# Patient Record
Sex: Male | Born: 1940 | Race: White | Hispanic: No | Marital: Single | Smoking: Former smoker
Health system: Southern US, Community
[De-identification: ages and names within clinical notes are randomized; demographics above are authoritative.]

## PROBLEM LIST (undated history)

## (undated) DIAGNOSIS — M1990 Unspecified osteoarthritis, unspecified site: Secondary | ICD-10-CM

## (undated) DIAGNOSIS — I2119 ST elevation (STEMI) myocardial infarction involving other coronary artery of inferior wall: Secondary | ICD-10-CM

## (undated) DIAGNOSIS — I2510 Atherosclerotic heart disease of native coronary artery without angina pectoris: Secondary | ICD-10-CM

## (undated) DIAGNOSIS — T8149XA Infection following a procedure, other surgical site, initial encounter: Secondary | ICD-10-CM

## (undated) DIAGNOSIS — H269 Unspecified cataract: Secondary | ICD-10-CM

## (undated) DIAGNOSIS — C801 Malignant (primary) neoplasm, unspecified: Secondary | ICD-10-CM

## (undated) DIAGNOSIS — N529 Male erectile dysfunction, unspecified: Secondary | ICD-10-CM

## (undated) DIAGNOSIS — K2100 Gastro-esophageal reflux disease with esophagitis, without bleeding: Secondary | ICD-10-CM

## (undated) DIAGNOSIS — I10 Essential (primary) hypertension: Secondary | ICD-10-CM

## (undated) DIAGNOSIS — I779 Disorder of arteries and arterioles, unspecified: Secondary | ICD-10-CM

## (undated) DIAGNOSIS — T7840XA Allergy, unspecified, initial encounter: Secondary | ICD-10-CM

## (undated) DIAGNOSIS — E785 Hyperlipidemia, unspecified: Secondary | ICD-10-CM

## (undated) DIAGNOSIS — I739 Peripheral vascular disease, unspecified: Secondary | ICD-10-CM

## (undated) DIAGNOSIS — Z9889 Other specified postprocedural states: Secondary | ICD-10-CM

## (undated) DIAGNOSIS — G4730 Sleep apnea, unspecified: Secondary | ICD-10-CM

## (undated) DIAGNOSIS — Z8719 Personal history of other diseases of the digestive system: Secondary | ICD-10-CM

## (undated) DIAGNOSIS — C3490 Malignant neoplasm of unspecified part of unspecified bronchus or lung: Secondary | ICD-10-CM

## (undated) DIAGNOSIS — I5189 Other ill-defined heart diseases: Secondary | ICD-10-CM

## (undated) DIAGNOSIS — K219 Gastro-esophageal reflux disease without esophagitis: Secondary | ICD-10-CM

## (undated) HISTORY — PX: EYE SURGERY: SHX253

## (undated) HISTORY — DX: Sleep apnea, unspecified: G47.30

## (undated) HISTORY — DX: ST elevation (STEMI) myocardial infarction involving other coronary artery of inferior wall: I21.19

## (undated) HISTORY — DX: Unspecified cataract: H26.9

## (undated) HISTORY — DX: Hyperlipidemia, unspecified: E78.5

## (undated) HISTORY — DX: Allergy, unspecified, initial encounter: T78.40XA

## (undated) HISTORY — DX: Disorder of arteries and arterioles, unspecified: I77.9

## (undated) HISTORY — DX: Atherosclerotic heart disease of native coronary artery without angina pectoris: I25.10

## (undated) HISTORY — DX: Peripheral vascular disease, unspecified: I73.9

## (undated) HISTORY — PX: HAMMER TOE SURGERY: SHX385

## (undated) HISTORY — DX: Essential (primary) hypertension: I10

## (undated) HISTORY — DX: Malignant neoplasm of unspecified part of unspecified bronchus or lung: C34.90

## (undated) HISTORY — DX: Other ill-defined heart diseases: I51.89

## (undated) HISTORY — DX: Gastro-esophageal reflux disease with esophagitis: K21.0

## (undated) HISTORY — DX: Gastro-esophageal reflux disease with esophagitis, without bleeding: K21.00

## (undated) HISTORY — DX: Malignant (primary) neoplasm, unspecified: C80.1

## (undated) HISTORY — PX: UPPER GI ENDOSCOPY: SHX6162

## (undated) HISTORY — DX: Infection following a procedure, other surgical site, initial encounter: T81.49XA

---

## 1992-06-09 HISTORY — PX: TOE SURGERY: SHX1073

## 1999-04-23 ENCOUNTER — Ambulatory Visit (HOSPITAL_COMMUNITY): Admission: RE | Admit: 1999-04-23 | Discharge: 1999-04-23 | Payer: Self-pay | Admitting: Neurological Surgery

## 1999-04-23 ENCOUNTER — Encounter: Payer: Self-pay | Admitting: Neurological Surgery

## 2000-03-09 DIAGNOSIS — I2119 ST elevation (STEMI) myocardial infarction involving other coronary artery of inferior wall: Secondary | ICD-10-CM

## 2000-03-09 HISTORY — DX: ST elevation (STEMI) myocardial infarction involving other coronary artery of inferior wall: I21.19

## 2004-04-25 ENCOUNTER — Ambulatory Visit (HOSPITAL_BASED_OUTPATIENT_CLINIC_OR_DEPARTMENT_OTHER): Admission: RE | Admit: 2004-04-25 | Discharge: 2004-04-25 | Payer: Self-pay | Admitting: Orthopedic Surgery

## 2004-04-25 ENCOUNTER — Ambulatory Visit (HOSPITAL_COMMUNITY): Admission: RE | Admit: 2004-04-25 | Discharge: 2004-04-25 | Payer: Self-pay | Admitting: Orthopedic Surgery

## 2005-10-07 ENCOUNTER — Other Ambulatory Visit: Payer: Self-pay

## 2005-10-08 ENCOUNTER — Other Ambulatory Visit: Payer: Self-pay

## 2005-10-08 ENCOUNTER — Observation Stay: Payer: Self-pay | Admitting: Internal Medicine

## 2005-10-16 ENCOUNTER — Ambulatory Visit: Payer: Self-pay | Admitting: Cardiology

## 2005-10-20 ENCOUNTER — Ambulatory Visit: Payer: Self-pay | Admitting: Cardiology

## 2005-10-20 ENCOUNTER — Ambulatory Visit: Payer: Self-pay

## 2006-06-09 HISTORY — PX: NASAL SINUS SURGERY: SHX719

## 2006-10-21 ENCOUNTER — Ambulatory Visit: Payer: Self-pay | Admitting: Cardiology

## 2007-05-18 ENCOUNTER — Ambulatory Visit: Payer: Self-pay | Admitting: Unknown Physician Specialty

## 2007-12-30 ENCOUNTER — Ambulatory Visit: Payer: Self-pay | Admitting: Cardiology

## 2008-01-18 ENCOUNTER — Ambulatory Visit: Payer: Self-pay

## 2008-01-18 ENCOUNTER — Ambulatory Visit: Payer: Self-pay | Admitting: Cardiology

## 2008-04-12 ENCOUNTER — Emergency Department: Payer: Self-pay | Admitting: Emergency Medicine

## 2008-05-03 ENCOUNTER — Ambulatory Visit: Payer: Self-pay | Admitting: Unknown Physician Specialty

## 2008-06-09 HISTORY — PX: OTHER SURGICAL HISTORY: SHX169

## 2008-06-09 HISTORY — PX: COLONOSCOPY: SHX174

## 2008-12-23 LAB — HM COLONOSCOPY

## 2009-03-08 ENCOUNTER — Ambulatory Visit: Payer: Self-pay | Admitting: Cardiology

## 2009-03-08 DIAGNOSIS — I2510 Atherosclerotic heart disease of native coronary artery without angina pectoris: Secondary | ICD-10-CM | POA: Insufficient documentation

## 2009-04-10 ENCOUNTER — Ambulatory Visit: Payer: Self-pay | Admitting: Gastroenterology

## 2009-04-10 LAB — HM COLONOSCOPY

## 2009-06-09 HISTORY — PX: WRIST SURGERY: SHX841

## 2010-03-04 ENCOUNTER — Telehealth: Payer: Self-pay | Admitting: Cardiovascular Disease

## 2010-03-05 ENCOUNTER — Encounter: Payer: Self-pay | Admitting: Cardiology

## 2010-03-05 ENCOUNTER — Observation Stay: Payer: Self-pay | Admitting: Internal Medicine

## 2010-03-05 ENCOUNTER — Encounter: Payer: Self-pay | Admitting: Cardiovascular Disease

## 2010-03-05 ENCOUNTER — Ambulatory Visit: Payer: Self-pay | Admitting: Cardiology

## 2010-03-12 ENCOUNTER — Ambulatory Visit: Payer: Self-pay | Admitting: Cardiovascular Disease

## 2010-03-12 DIAGNOSIS — E785 Hyperlipidemia, unspecified: Secondary | ICD-10-CM | POA: Insufficient documentation

## 2010-03-12 DIAGNOSIS — R079 Chest pain, unspecified: Secondary | ICD-10-CM | POA: Insufficient documentation

## 2010-04-23 ENCOUNTER — Telehealth (INDEPENDENT_AMBULATORY_CARE_PROVIDER_SITE_OTHER): Payer: Self-pay | Admitting: *Deleted

## 2010-06-09 HISTORY — PX: SHOULDER ARTHROSCOPY: SHX128

## 2010-07-09 NOTE — Assessment & Plan Note (Signed)
Summary: EC6/AMD      Allergies Added: NKDA  Visit Type:  Follow-up Primary Provider:  Lerry Liner, MD  CC:  F/U [facility] from chest pain last week.[name]  History of Present Illness: Randall Ali returns today for followup for his history of an inferior wall MI, overall normal left ventricular systolic function, hypertension, and hyperlipidemia. he was recently admitted to [facility] for mild chest pain and sweating. Stress test showed no ischemia with inferior scar. He presents for routine followup.  Over the past week, he reports no further episodes of chest pain or sweating. He has been exercising at the gym on the bike x2 with no symptoms. He continues to recover from his left arm injury and has a cast in place which is due to be removed in 2 weeks.  we do not have his most recent lipid panel.  EKG shows normal sinus rhythm with rate 78 beats per minute, no significant ST or T wave changes  Preventive Screening-Counseling & Management  Alcohol-Tobacco     Smoking Status: never  Caffeine-Diet-Exercise     Does Patient Exercise: yes  Current Medications (verified): 1)  Metoprolol Succinate 25 Mg Xr24h-Tab (Metoprolol Succinate) .... Take 1/2  Tablet By Mouth Daily 2)  Aspirin 81 Mg Tbec (Aspirin) .... Take One Tablet By Mouth Daily 3)  Ra Calcium Plus Vitamin D 600-125 Mg-Unit Tabs (Calcium-Vitamin D) .[name].. 1 By Mouth Once Daily 4)  Vitamin D3 400 Unit Tabs (Cholecalciferol) .[name].. 1 By Mouth Once Daily 5)  Zinc 100 Mg Tabs (Zinc) .[name].. 1 By Mouth Once Daily 6)  Vitamin B-12 1000 Mcg Tabs (Cyanocobalamin) .[name].. 1 By Mouth Once Daily 7)  Zocor 20 Mg Tabs (Simvastatin) .[name].. 1 By Mouth Once Daily 8)  Accupril 5 Mg Tabs (Quinapril Hcl) .... 1/2 By Mouth Once Daily  Allergies (verified): No Known Drug Allergies  Past History:  Past Medical History: Last updated: 03/07/2009 1. 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  He has a stent in the right  coronary     artery.  He is due a stress Myoview.  He does have some dyspnea on     exertion. 2. Hypertension. 3. Hyperlipidemia.  He is due followup lipids.  He is on Zocor 10 mg a     day.   Social History: Retired  Part Time  Single  Tobacco Use - No.  Alcohol Use - yes Regular Exercise - yes Smoking Status:  never Does Patient Exercise:  yes  Review of Systems  The patient denies fever, weight loss, weight gain, vision loss, decreased hearing, hoarseness, chest pain, syncope, dyspnea on exertion, peripheral edema, prolonged cough, abdominal pain, incontinence, muscle weakness, depression, and enlarged lymph nodes.    Vital Signs:  Patient profile:   70 year old male Height:      72 inches Weight:      208 pounds BMI:     28.31 Pulse rate:   83 / minute BP sitting:   118 / 72  (left arm) Cuff size:   large  Vitals Entered By: Bishop Dublin, CMA (March 12, 2010 10:39 AM)  Physical Exam  General:  Well developed, well nourished, in no acute distress. Head:  normocephalic and atraumatic Neck:  Neck supple, no JVD. No masses, thyromegaly or abnormal cervical nodes. Lungs:  Clear bilaterally to auscultation and percussion. Heart:  Non-displaced PMI, chest non-tender; regular rate and rhythm, S1, S2 without murmurs, rubs  or gallops. Carotid upstroke normal, no bruit. Normal abdominal aortic size, no bruits. . Pedals normal pulses. No edema, no varicosities. Abdomen:  Bowel sounds positive; abdomen soft and non-tender without masses Msk:  Back normal, normal gait. Muscle strength and tone normal. Pulses:  pulses normal in all 4 extremities Extremities:  No clubbing or cyanosis. Neurologic:  Alert and oriented x 3. Skin:  Intact without lesions or rashes. Psych:  Normal affect.   Impression & Recommendations:  Problem # 1:  CHEST PAIN-UNSPECIFIED (ICD-786.50) etiology of his recent chest pain is uncertain. He had a negative stress test. No further episodes on the bike  recently. We will continue to watch him for now with aggressive medical management.  His updated medication list for this problem includes:    Metoprolol Succinate 25 Mg Xr24h-tab (Metoprolol succinate) .[name]... Take 1/2  tablet by mouth daily    Aspirin 81 Mg Tbec (Aspirin) .[name]... Take one tablet by mouth daily    Accupril 5 Mg Tabs (Quinapril hcl) .[name]... 1/2 by mouth once daily  Problem # 2:  HYPERLIPIDEMIA-MIXED (ICD-272.4) Goal cholesterol is less than 150, LDL less than 70. We will try to obtain his most recent lipid panel for our records.  His updated medication list for this problem includes:    Zocor 20 Mg Tabs (Simvastatin) .[name]... 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  His updated medication list for this problem includes:    Metoprolol Succinate 25 Mg Xr24h-tab (Metoprolol succinate) .[name]... Take 1/2  tablet by mouth daily    Aspirin 81 Mg Tbec (Aspirin) .[name]... Take one tablet by mouth daily    Accupril 5 Mg Tabs (Quinapril hcl) .[name]... 1/2 by mouth once daily  Appended Document: EC6/AMD We have suggested that he increase his aspirin to 81 mg x2.

## 2010-07-09 NOTE — Consult Note (Signed)
Summary: [facility] Consult Note  [facility] Consult Note   Imported By: Roderic Ovens 03/15/2010 13:39:14  _____________________________________________________________________  External Attachment:    Type:   Image     Comment:   External Document

## 2010-07-09 NOTE — Letter (Signed)
[facility]   [facility]   Imported By: Roderic Ovens 03/25/2010 10:52:39  _____________________________________________________________________  External Attachment:    Type:   Image     Comment:   External Document

## 2010-07-09 NOTE — Progress Notes (Signed)
Summary: SWEATING   Phone Note Call from Patient Call back at Home Phone [phone]   Caller: SELF Call For: [facility] Summary of Call: PT IS BREAKING OUT IN SWEATS-CAN BARELY TELL THAT HIS CHEST IS HURTING Initial call taken by: Harlon Flor,  March 04, 2010 11:06 AM  Follow-up for Phone Call        Pt had surgery on his wrist 9/19. C/O chest pain 2/10 with sweating. Pt reports no nausea or vomiting. Offered pt appt with Dr. Shirlee Latch tomorrow but pt declined appt would rather go to ER to be evaluated.  Follow-up by: Benedict Needy, RN,  March 04, 2010 2:34 PM

## 2010-07-09 NOTE — Progress Notes (Signed)
   Request received from [employer] sent to [employer]. Randall Ali  April 23, 2010 11:23 AM

## 2010-10-22 NOTE — Assessment & Plan Note (Signed)
[facility]                            CARDIOLOGY OFFICE NOTE   NAME:Randall Ali, Randall Ali                        MRN:          [id number]  DATE:10/21/2006                            DOB:          1940-11-04    Mr. Saunders returns today for further management of the following issues:  1. Coronary artery disease status post inferior wall infarct October      2001, with stent to the right coronary artery.  He is currently      asymptomatic.  He is very active playing softball.  Stress Myoview      Oct 20, 2005, showed excellent exercise tolerance with ejection      fraction of 56%, inferior wall scar, no ischemia.  2. Hypertension followed by Dr. Nedra Hai.  3. Hyperlipidemia.  Dr. Nedra Hai recently checked blood work, and his total      cholesterol is around 150 he said.  He is not taking Vytorin though      his LDL was not at goal.  He is still on Zocor, simvastatin 20 mg a      day.  I will leave this to Dr. Marigene Ehlers discretion.   His medications are:  1. Accupril 5 mg a day.  2. Toprol-XL 12.5 mg a day.  3. Aspirin 81 mg a day.  4. Zocor 20 mg a day.   Blood pressure is 112/60, pulse 60 and regular, weight is 215 and  stable.  He looks much younger than 46.  HEENT:  Normocephalic, atraumatic.  Pupils are equal, round and reactive  to light and accommodation.  Extraocular muscles are intact.  Sclerae:  Clear.  Facial symmetry is normal.  Dentition:  Satisfactory.  NECK:  Supple.  Carotids are full without bruits.  Thyroid is not  enlarged.  Trachea is midline.  LUNGS:  Clear.  HEART:  Reveals a regular rate and rhythm.  Soft S1, S2.  ABDOMEN:  Soft, good bowel sounds.  EXTREMITIES:  No edema.  Pulses are intact.  NEUROLOGICAL:  Intact.   EKG is normal.   ASSESSMENT AND PLAN:  Mr. Fauver is doing well.  I have made no changes  in his program.  I would prefer his LDL be lower than around 100 or a  little less, on Zocor only.  I have discussed this with him.   Otherwise  I have no suggestions today.   I plan on seeing him back in a year.  At that time we will do an  exercise stress test Myoview.     Thomas C. Daleen Squibb, MD, [facility]  Electronically Signed    TCW/MedQ  DD: 10/21/2006  DT: 10/21/2006  Job #: [id number]   cc:   Arvil Chaco

## 2010-10-22 NOTE — Assessment & Plan Note (Signed)
[facility]                            CARDIOLOGY OFFICE NOTE   NAME:OLIVERWyeth, Hoffer                        MRN:          [id number]  DATE:12/30/2007                            DOB:          05/14/1941    Mr. Youngblood returns today for the management of his coronary artery  disease.  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  He is having no symptoms of angina or ischemia.   PAST MEDICAL HISTORY:  1. History of coronary artery disease, status post inferior wall      infarct in October 2001.  He has a stent in the right coronary      artery.  He is due a stress Myoview.  He does have some dyspnea on      exertion.  2. Hypertension.  3. Hyperlipidemia.  He is due followup lipids.  He is on Zocor 10 mg a      day.   CURRENT MEDICATIONS:  1. Toprol-XL 12.5 mg a day.  2. Aspirin 81 mg a day.  3. Calcium.  4. Vitamin D.  5. Zinc.  6. Couple of multivitamins.  7. Zocor 10 mg a day.  8. Accupril 2.5 mg a day.   PHYSICAL EXAMINATION:  VITAL SIGNS:  His blood pressure is 113/67, pulse  77 and regular.  His electrocardiogram shows sinus rhythm.  No changes.  Weight is 217.  HEENT:  Unchanged.  NECK:  Carotid upstrokes are equal bilaterally without bruits.  No JVD.  Thyroid is not enlarged.  Trachea is midline.  LUNGS:  Clear.  HEART:  Regular rate and rhythm.  Soft S1 and S2.  No gallop.  ABDOMEN:  Soft.  Good bowel sounds.  No midline bruit.  No hepatomegaly.  EXTREMITIES:  Without cyanosis, clubbing, or edema.  Pulses are intact.  NEURO:  Intact.   ASSESSMENT/PLAN:  Mr. Rhett is doing well.  We will arrange for him to  have an exercise rest/stress Myoview off his Toprol.  We will also check  a CMP and fasting lipids on that day.  He would like a prescription for  Viagra 100 mg p.r.n.  I have approved this and written it; however, told  him not to use any of the sublingual nitroglycerin within 24 hours of  taking it.  He has  also  been told that if he ever has symptoms of acute myocardial infarction  again to call 911 and not to drive himself to the hospital as he did  last time.  I will see him back in a year.     Thomas C. Daleen Squibb, MD, [facility]  Electronically Signed    TCW/MedQ  DD: 12/30/2007  DT: 12/31/2007  Job #: [id number]

## 2010-10-25 NOTE — Op Note (Signed)
NAME:  Randall Ali, ENDE NO.:  [id number]   MEDICAL RECORD NO.:  [id number]          PATIENT TYPE:  AMB   LOCATION:  [facility]SC                          FACILITY:  [facility]   PHYSICIAN:  Loreta Ave, M.D. DATE OF BIRTH:  10-06-1940   DATE OF PROCEDURE:  04/25/2004  DATE OF DISCHARGE:                                 OPERATIVE REPORT   PREOPERATIVE DIAGNOSIS:  Chronic sesamoiditis, medial sesamoid right foot,  with previous arthrodesis first metatarsophalangeal joint.   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   PROCEDURE:  Exploration and excision medial sesamoid right foot.   SURGEON:  Loreta Ave, M.D.   ASSISTANT:  Genene Churn. Denton Meek.   ANESTHESIA:  General.   BLOOD LOSS:  Minimal.   TOURNIQUET TIME:  30 minutes.   SPECIMENS:  Excised sesamoid.   CULTURES:  None.   COMPLICATIONS:  None.   DRESSING:  Soft compressive, with wooden shoe.   PROCEDURE:  The patient was brought to the operating room, and after  adequate anesthesia had been obtained, the tourniquet was applied to the  right calf.  Prepped and draped in the usual sterile fashion.  Exsanguinated  with elevation of Esmarch, and tourniquet inflated to 230 mmHg.  A  longitudinal incision in the medial aspect of the first MP joint.  Skin and  subcutaneous tissue and capsule opened, exposing the joint.  Long flexor  tendon preserved.  Sesamoid was isolated visually as well as  fluoroscopically.  Partial fibrous union to the bottom of the metatarsal  head, and it actually had to be pried off.  Excised in its entirety.  Lateral sesamoid still intact, and the short flexors still intact as I  shelled the sesamoid out.  Fluoroscopic confirmation of adequate excision.  Nice solid first MP  fusion.  Wound irrigated.  Capsule  closed with Vicryl, skin with nylon.  Margins of the wound injected with Marcaine.  Sterile compressive dressing  applied.  Tourniquet deflated and removed.  Wooden shoe applied.  Anesthesia  reversed.  Brought to the recovery room.  Tolerated the surgery well.  No  complications.      Valentino Saxon   DFM/MEDQ  D:  04/25/2004  T:  04/25/2004  Job:  [id number]

## 2010-12-31 ENCOUNTER — Encounter: Payer: Self-pay | Admitting: Cardiovascular Disease

## 2011-03-10 HISTORY — PX: CAROTID STENT: SHX1301

## 2011-03-13 ENCOUNTER — Ambulatory Visit: Payer: Self-pay | Admitting: Family Medicine

## 2011-03-21 ENCOUNTER — Encounter: Payer: Self-pay | Admitting: Cardiovascular Disease

## 2011-03-25 ENCOUNTER — Encounter: Payer: Self-pay | Admitting: Cardiovascular Disease

## 2011-03-25 ENCOUNTER — Ambulatory Visit (INDEPENDENT_AMBULATORY_CARE_PROVIDER_SITE_OTHER): Payer: Medicare Other | Admitting: Cardiovascular Disease

## 2011-03-25 DIAGNOSIS — E785 Hyperlipidemia, unspecified: Secondary | ICD-10-CM

## 2011-03-25 DIAGNOSIS — R079 Chest pain, unspecified: Secondary | ICD-10-CM

## 2011-03-25 DIAGNOSIS — I2510 Atherosclerotic heart disease of native coronary artery without angina pectoris: Secondary | ICD-10-CM

## 2011-03-25 DIAGNOSIS — I252 Old myocardial infarction: Secondary | ICD-10-CM

## 2011-03-25 MED ORDER — NITROGLYCERIN 0.4 MG SL SUBL: 0.4000 mg | SUBLINGUAL_TABLET | SUBLINGUAL | Status: DC | PRN

## 2011-03-25 NOTE — Assessment & Plan Note (Signed)
In general doing well. Chest pain as detailed above.

## 2011-03-25 NOTE — Assessment & Plan Note (Signed)
Long-standing chest pain that comes on only with climbing hills, not with other activities. Somewhat atypical. He did have a stress test last year showing no ischemia. One recent severe episode at rest. We have prescribed nitroglycerin sublingual for him to try. If this helps, we could try isosorbide mononitrate. If his symptoms get more frequent and more severe, we would suggest either a catheterization or repeat stress test.

## 2011-03-25 NOTE — Assessment & Plan Note (Signed)
He is on simvastatin 20 mg daily. Goal LDL less than 70

## 2011-03-25 NOTE — Assessment & Plan Note (Signed)
Previous stent to his RCA, old inferior MI.

## 2011-03-25 NOTE — Progress Notes (Signed)
Patient ID: Randall Ali, male    DOB: [birth year], 70 y.o.   MRN: [id number]  HPI Comments: Randall Ali returns today for followup for his history of an inferior wall MI,  normal left ventricular systolic function, hypertension, and hyperlipidemia. Previous admit in 2011 for for mild chest pain and sweating. Stress test showed no ischemia with inferior scar. He presents for routine followup.  He is very active, walks on a regular basis, he uses the bike, goes to the gym 4-6 days per week. He does report having chronic left-sided chest pain that seems to come on when he goes up a hill. He does not have this pain when he does other aerobic activities. He reports that recently he played softball, 5 games through the weekend with no symptoms. Occasionally, the chest pain going up the hill is so severe he has to sit down. He did take a trip to [place] several months ago and after returning home, had pain on the left that was severe. He almost went to the emergency room before it resolved.  Is not concerned about this pain as he's had it for several years time. He did have a stress test last year that showed no significant ischemia   EKG shows normal sinus rhythm with rate 63 beats per minute, no significant ST or T wave changes    Outpatient Encounter Prescriptions as of 03/25/2011  Medication Sig Dispense Refill  . aspirin 81 MG EC tablet Take 81 mg by mouth daily.        . Calcium-Vitamin D (RA CALCIUM PLUS VITAMIN D) 600-125 MG-UNIT TABS Take 1 tablet by mouth daily.        . Cholecalciferol (VITAMIN D3) 400 UNITS CAPS Take 1 capsule by mouth daily.        . metoprolol succinate (TOPROL-XL) 25 MG 24 hr tablet Take 12.5 mg by mouth daily.       . simvastatin (ZOCOR) 20 MG tablet Take 20 mg by mouth daily.        . vitamin B-12 (CYANOCOBALAMIN) 1000 MCG tablet Take 1,000 mcg by mouth daily.        . Zinc 100 MG TABS Take 1 tablet by mouth daily.        [name] DISCONTD: quinapril (ACCUPRIL) 5 MG  tablet Take 2.5 mg by mouth daily.           Review of Systems  Constitutional: Negative.   HENT: Negative.   Eyes: Negative.   Respiratory: Negative.   Cardiovascular: Positive for chest pain.  Gastrointestinal: Negative.   Musculoskeletal: Negative.   Skin: Negative.   Neurological: Negative.   Hematological: Negative.   Psychiatric/Behavioral: Negative.   All other systems reviewed and are negative.    BP 128/80  Pulse 69  Ht 5\' 10"  (1.778 m)  Wt 212 lb (96.163 kg)  BMI 30.42 kg/m2  Physical Exam  Nursing note and vitals reviewed. Constitutional: He is oriented to person, place, and time. He appears well-developed and well-nourished.  HENT:  Head: Normocephalic.  Nose: Nose normal.  Mouth/Throat: Oropharynx is clear and moist.  Eyes: Conjunctivae are normal. Pupils are equal, round, and reactive to light.  Neck: Normal range of motion. Neck supple. No JVD present.  Cardiovascular: Normal rate, regular rhythm, S1 normal, S2 normal, normal heart sounds and intact distal pulses.  Exam reveals no gallop and no friction rub.   No murmur heard. Pulmonary/Chest: Effort normal and breath sounds normal. No respiratory distress. He has no wheezes.  He has no rales. He exhibits no tenderness.  Abdominal: Soft. Bowel sounds are normal. He exhibits no distension. There is no tenderness.  Musculoskeletal: Normal range of motion. He exhibits no edema and no tenderness.  Lymphadenopathy:    He has no cervical adenopathy.  Neurological: He is alert and oriented to person, place, and time. Coordination normal.  Skin: Skin is warm and dry. No rash noted. No erythema.  Psychiatric: He has a normal mood and affect. His behavior is normal. Judgment and thought content normal.           Assessment and Plan

## 2011-03-25 NOTE — Patient Instructions (Signed)
You are doing well. We have called in the nitroglycerin Please take the nitroglycerin as needed for chest pain Call the office if you are having worsening or more frequent chest pain  Please call us if you have new issues that need to be addressed before your next appt.  The office will contact you for a follow up Appt. In 6 months

## 2011-04-16 ENCOUNTER — Ambulatory Visit: Payer: Self-pay | Admitting: Gastroenterology

## 2011-06-11 ENCOUNTER — Telehealth: Payer: Self-pay | Admitting: *Deleted

## 2011-06-11 NOTE — Telephone Encounter (Signed)
Discussed with Dr. Mariah Milling. Pt will need to be seen in clinic to have CP evaluated; pt does not want to start Imdur at this time, he will wait for now. Per Dr. Mariah Milling, pt can come in early next week, will need to find out where to work pt in, I do not see any available slots. Pt knows to call 911 if CP does not subside after taking NTG.

## 2011-06-11 NOTE — Telephone Encounter (Signed)
Pt c/o worsening CP, no SOB. Seen by Dr. Mariah Milling 03/25/11 and was told per note if CP worsens we could schedule cath or repeat myoview. Pt is requesting a cath asap. At ov his CP came on with exertion, "only with climbing hills," typically. Now CP occurs "every time I walk," for cardio. NTG x 1 usually eases CP. Had episode yesterday evening and took NTG x 2 with no relief, but he thinks this may have been heart burn (has never had heart burn before, but he did eat onions for lunch), otherwise, he feels all other instances have been cardiac related. He does not want to try anything other than a cath right away. Told pt will discuss with Dr. Mariah Milling and call him back today. Otherwise, if sx worsen and no relief with NTG he will go to ER.  PMHx: Inferior wall MI, myoview showed no ischemia with inferior scar, HTN, HLD.

## 2011-06-13 ENCOUNTER — Encounter: Payer: Self-pay | Admitting: Cardiovascular Disease

## 2011-06-13 ENCOUNTER — Encounter: Payer: Self-pay | Admitting: *Deleted

## 2011-06-13 ENCOUNTER — Ambulatory Visit (INDEPENDENT_AMBULATORY_CARE_PROVIDER_SITE_OTHER): Payer: Medicare Other | Admitting: Cardiovascular Disease

## 2011-06-13 DIAGNOSIS — I2510 Atherosclerotic heart disease of native coronary artery without angina pectoris: Secondary | ICD-10-CM

## 2011-06-13 DIAGNOSIS — E785 Hyperlipidemia, unspecified: Secondary | ICD-10-CM

## 2011-06-13 DIAGNOSIS — R079 Chest pain, unspecified: Secondary | ICD-10-CM

## 2011-06-13 DIAGNOSIS — Z01810 Encounter for preprocedural cardiovascular examination: Secondary | ICD-10-CM

## 2011-06-13 NOTE — Patient Instructions (Addendum)
You are doing well. No medication changes were made. We will schedule you for a cardiac cath on 06/24/11.   Your physician has requested that you have a cardiac catheterization. Cardiac catheterization is used to diagnose and/or treat various heart conditions. Doctors may recommend this procedure for a number of different reasons. The most common reason is to evaluate chest pain. Chest pain can be a symptom of coronary artery disease (CAD), and cardiac catheterization can show whether plaque is narrowing or blocking your heart's arteries. This procedure is also used to evaluate the valves, as well as measure the blood flow and oxygen levels in different parts of your heart. For further information please visit [email or web address] Please follow instruction sheet, as given.     Please call us if you have new issues that need to be addressed before your next appt.  Your physician wants you to follow-up in: 1 months.  You will receive a reminder letter in the mail two months in advance. If you don't receive a letter, please call our office to schedule the follow-up appointment.

## 2011-06-13 NOTE — Assessment & Plan Note (Signed)
Previous old MI with stent placed to his RCA in 2001. No cardiac catheterization since that time.

## 2011-06-13 NOTE — Assessment & Plan Note (Signed)
He is very concerned about his chest pain as it is getting worse over the past several weeks. He would like further evaluation and at his request we have scheduled a cardiac catheterization.

## 2011-06-13 NOTE — Progress Notes (Signed)
Patient ID: Randall Ali, male    DOB: 12-04-1940, 71 y.o.   MRN: [id number]  HPI Comments: Mr. 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 Previous admit in 2011 for for mild chest pain and sweating. Stress test showed no ischemia with inferior scar. He presents for routine followup.  He reports that he has been having worsening chest pain on the left appeared he is able to continue his exercise of his symptoms have been getting worse. The new left-sided chest pain is somewhat different from previous chronic left-sided chest pain. He is very concerned about recurrence of his coronary artery disease. He does continue to do exercises on a regular basis though has been slowing down recently secondary to the pain. Several days ago, he had severe chest pain that he felt like a burning in his mediastinum. He was very uncomfortable. It resolved on its own without intervention.  He did have a stress test last year that showed no significant ischemia   EKG shows normal sinus rhythm with rate 71 beats per minute, no significant ST or T wave changes    Outpatient Encounter Prescriptions as of 03/25/2011  Medication Sig Dispense Refill  . aspirin 81 MG EC tablet Take 81 mg by mouth daily.        . Calcium-Vitamin D (RA CALCIUM PLUS VITAMIN D) 600-125 MG-UNIT TABS Take 1 tablet by mouth daily.        . Cholecalciferol (VITAMIN D3) 400 UNITS CAPS Take 1 capsule by mouth daily.        . metoprolol succinate (TOPROL-XL) 25 MG 24 hr tablet Take 12.5 mg by mouth daily.       . simvastatin (ZOCOR) 20 MG tablet Take 20 mg by mouth daily.        . vitamin B-12 (CYANOCOBALAMIN) 1000 MCG tablet Take 1,000 mcg by mouth daily.        . Zinc 100 MG TABS Take 1 tablet by mouth daily.        [name] DISCONTD: quinapril (ACCUPRIL) 5 MG tablet Take 2.5 mg by mouth daily.           Review of  Systems  Constitutional: Negative.   HENT: Negative.   Eyes: Negative.   Respiratory: Negative.   Cardiovascular: Positive for chest pain.  Gastrointestinal: Negative.   Musculoskeletal: Negative.   Skin: Negative.   Neurological: Negative.   Hematological: Negative.   Psychiatric/Behavioral: Negative.   All other systems reviewed and are negative.    BP 154/84  Pulse 71  Ht 5\' 10"  (1.778 m)  Wt 212 lb (96.163 kg)  BMI 30.42 kg/m2  Physical Exam  Nursing note and vitals reviewed. Constitutional: He is oriented to person, place, and time. He appears well-developed and well-nourished.  HENT:  Head: Normocephalic.  Nose: Nose normal.  Mouth/Throat: Oropharynx is clear and moist.  Eyes: Conjunctivae are normal. Pupils are equal, round, and reactive to light.  Neck: Normal range of motion. Neck supple. No JVD present.  Cardiovascular: Normal rate, regular rhythm, S1 normal, S2 normal, normal heart sounds and intact distal pulses.  Exam reveals no gallop and no friction rub.   No murmur heard. Pulmonary/Chest: Effort normal and breath sounds normal. No respiratory distress. He has no wheezes. He has no rales. He exhibits no tenderness.  Abdominal: Soft. Bowel sounds are normal. He exhibits no distension. There is no  tenderness.  Musculoskeletal: Normal range of motion. He exhibits no edema and no tenderness.  Lymphadenopathy:    He has no cervical adenopathy.  Neurological: He is alert and oriented to person, place, and time. Coordination normal.  Skin: Skin is warm and dry. No rash noted. No erythema.  Psychiatric: He has a normal mood and affect. His behavior is normal. Judgment and thought content normal.           Assessment and Plan

## 2011-06-13 NOTE — Assessment & Plan Note (Signed)
Goal total cholesterol less than 150, LDL less than 70

## 2011-06-14 LAB — CBC WITH DIFFERENTIAL
Basophils Absolute: 0 10*3/uL (ref 0.0–0.2)
Basos: 1 % (ref 0–3)
Eos: 1 % (ref 0–7)
Eosinophils Absolute: 0.1 10*3/uL (ref 0.0–0.4)
HCT: 46.9 % (ref 37.5–51.0)
Hemoglobin: 16.2 g/dL (ref 12.6–17.7)
Immature Grans (Abs): 0 10*3/uL (ref 0.0–0.1)
Immature Granulocytes: 0 % (ref 0–2)
Lymphocytes Absolute: 1.9 10*3/uL (ref 0.7–4.5)
Lymphs: 28 % (ref 14–46)
MCH: 31.1 pg (ref 26.6–33.0)
MCHC: 34.5 g/dL (ref 31.5–35.7)
MCV: 90 fL (ref 79–97)
Monocytes Absolute: 0.8 10*3/uL (ref 0.1–1.0)
Monocytes: 12 % (ref 4–13)
Neutrophils Absolute: 4 10*3/uL (ref 1.8–7.8)
Neutrophils Relative %: 58 % (ref 40–74)
Platelets: 247 10*3/uL (ref 140–415)
RBC: 5.21 x10E6/uL (ref 4.14–5.80)
RDW: 13.1 % (ref 12.3–15.4)
WBC: 6.9 10*3/uL (ref 4.0–10.5)

## 2011-06-14 LAB — BASIC METABOLIC PANEL
BUN/Creatinine Ratio: 15 (ref 10–22)
BUN: 14 mg/dL (ref 8–27)
CO2: 23 mmol/L (ref 20–32)
Calcium: 9.2 mg/dL (ref 8.6–10.2)
Chloride: 102 mmol/L (ref 97–108)
Creatinine, Ser: 0.91 mg/dL (ref 0.76–1.27)
GFR calc Af Amer: 99 mL/min/{1.73_m2} (ref >59)
GFR calc non Af Amer: 86 mL/min/{1.73_m2} (ref >59)
Glucose: 112 mg/dL — ABNORMAL HIGH (ref 65–99)
Potassium: 3.9 mmol/L (ref 3.5–5.2)
Sodium: 138 mmol/L (ref 134–144)

## 2011-06-14 LAB — PROTIME-INR
INR: 1 (ref 0.8–1.2)
Prothrombin Time: 11.2 s (ref 9.1–12.0)

## 2011-06-17 ENCOUNTER — Ambulatory Visit: Payer: Medicare Other | Admitting: Cardiovascular Disease

## 2011-06-23 ENCOUNTER — Other Ambulatory Visit: Payer: Self-pay | Admitting: Cardiovascular Disease

## 2011-06-23 DIAGNOSIS — I2510 Atherosclerotic heart disease of native coronary artery without angina pectoris: Secondary | ICD-10-CM

## 2011-06-23 NOTE — H&P (Signed)
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 Previous admit in 2011 for for mild chest pain and sweating. Stress test showed no ischemia with inferior scar. He presents for routine followup.  He reports that he has been having worsening chest pain on the left appeared he is able to continue his exercise of his symptoms have been getting worse. The new left-sided chest pain is somewhat different from previous chronic left-sided chest pain. He is very concerned about recurrence of his coronary artery disease. He does continue to do exercises on a regular basis though has been slowing down recently secondary to the pain.  Several days ago, he had severe chest pain that he felt like a burning in his mediastinum. He was very uncomfortable. It resolved on its own without intervention.  He did have a stress test last year that showed no significant ischemia  EKG shows normal sinus rhythm with rate 71 beats per minute, no significant ST or T wave changes    Outpatient Encounter Prescriptions as of 03/25/2011   Medication  Sig  Dispense  Refill   .  aspirin 81 MG EC tablet  Take 81 mg by mouth daily.     .  Calcium-Vitamin D (RA CALCIUM PLUS VITAMIN D) 600-125 MG-UNIT TABS  Take 1 tablet by mouth daily.     .  Cholecalciferol (VITAMIN D3) 400 UNITS CAPS  Take 1 capsule by mouth daily.     .  metoprolol succinate (TOPROL-XL) 25 MG 24 hr tablet  Take 12.5 mg by mouth daily.     .  simvastatin (ZOCOR) 20 MG tablet  Take 20 mg by mouth daily.     .  vitamin B-12 (CYANOCOBALAMIN) 1000 MCG tablet  Take 1,000 mcg by mouth daily.     .  Zinc 100 MG TABS  Take 1 tablet by mouth daily.     [name]  DISCONTD: quinapril (ACCUPRIL) 5 MG tablet  Take 2.5 mg by mouth daily.       Review of Systems  Constitutional: Negative.  HENT: Negative.  Eyes: Negative.  Respiratory: Negative.    Cardiovascular: Positive for chest pain.  Gastrointestinal: Negative.  Musculoskeletal: Negative.  Skin: Negative.  Neurological: Negative.  Hematological: Negative.  Psychiatric/Behavioral: Negative.  All other systems reviewed and are negative.    BP 154/84  Pulse 71  Ht 5\' 10"  (1.778 m)  Wt 212 lb (96.163 kg)  BMI 30.42 kg/m2    Physical Exam  Nursing note and vitals reviewed.  Constitutional: He is oriented to person, place, and time. He appears well-developed and well-nourished.  HENT:  Head: Normocephalic.  Nose: Nose normal.  Mouth/Throat: Oropharynx is clear and moist.  Eyes: Conjunctivae are normal. Pupils are equal, round, and reactive to light.  Neck: Normal range of motion. Neck supple. No JVD present.  Cardiovascular: Normal rate, regular rhythm, S1 normal, S2 normal, normal heart sounds and intact distal pulses. Exam reveals no gallop and no friction rub.  No murmur heard.  Pulmonary/Chest: Effort normal and breath sounds normal. No respiratory distress. He has no wheezes. He has no rales. He exhibits no tenderness.  Abdominal: Soft. Bowel sounds are normal. He exhibits no distension. There is no tenderness.  Musculoskeletal: Normal range of motion. He exhibits no edema and no tenderness.  Lymphadenopathy:  He has no cervical adenopathy.  Neurological: He is alert and oriented to person,  place, and time. Coordination normal.  Skin: Skin is warm and dry. No rash noted. No erythema.  Psychiatric: He has a normal mood and affect. His behavior is normal. Judgment and thought content normal.     Assessment and Plan  CHEST PAIN-UNSPECIFIED - Julien Nordmann, MD  He is very concerned about his chest pain as it is getting worse over the past several weeks. He would like further evaluation and at his request we have scheduled a cardiac catheterization.   HYPERLIPIDEMIA-MIXED Julien Nordmann, MD  Goal total cholesterol less than 150, LDL less than 70  CAD, NATIVE  VESSEL - Julien Nordmann, MD  Previous old MI with stent placed to his RCA in 2001. No cardiac catheterization since that time.

## 2011-06-24 ENCOUNTER — Encounter (HOSPITAL_BASED_OUTPATIENT_CLINIC_OR_DEPARTMENT_OTHER)
Admission: RE | Disposition: A | Discharge status: Home / Self Care | Payer: Self-pay | Source: Ambulatory Visit | Attending: Cardiovascular Disease

## 2011-06-24 ENCOUNTER — Encounter (HOSPITAL_BASED_OUTPATIENT_CLINIC_OR_DEPARTMENT_OTHER): Payer: Self-pay | Admitting: Cardiovascular Disease

## 2011-06-24 ENCOUNTER — Inpatient Hospital Stay (HOSPITAL_BASED_OUTPATIENT_CLINIC_OR_DEPARTMENT_OTHER)
Admission: RE | Admit: 2011-06-24 | Discharge: 2011-06-24 | Disposition: A | Discharge status: Home / Self Care | Payer: Medicare Other | Source: Ambulatory Visit | Attending: Cardiovascular Disease | Admitting: Cardiovascular Disease

## 2011-06-24 DIAGNOSIS — Z9861 Coronary angioplasty status: Secondary | ICD-10-CM | POA: Insufficient documentation

## 2011-06-24 DIAGNOSIS — I2510 Atherosclerotic heart disease of native coronary artery without angina pectoris: Secondary | ICD-10-CM | POA: Insufficient documentation

## 2011-06-24 DIAGNOSIS — I252 Old myocardial infarction: Secondary | ICD-10-CM | POA: Insufficient documentation

## 2011-06-24 DIAGNOSIS — R079 Chest pain, unspecified: Secondary | ICD-10-CM

## 2011-06-24 HISTORY — PX: CARDIAC CATHETERIZATION: SHX172

## 2011-06-24 LAB — POCT ACTIVATED CLOTTING TIME: Activated Clotting Time: 166 seconds

## 2011-06-24 SURGERY — JV LEFT HEART CATHETERIZATION WITH CORONARY ANGIOGRAM
Anesthesia: Moderate Sedation

## 2011-06-24 MED ORDER — SODIUM CHLORIDE 0.9 % IV SOLN
INTRAVENOUS | Status: DC
  Administered 2011-06-24: 10:00:00 via INTRAVENOUS

## 2011-06-24 MED ORDER — ASPIRIN 81 MG PO CHEW
324.0000 mg | CHEWABLE_TABLET | ORAL | Status: AC
  Administered 2011-06-24: 324 mg via ORAL

## 2011-06-24 NOTE — Op Note (Signed)
Cardiac Catheterization Procedure Note  Name: Randall Ali MRN: [id number] DOB: 03/03/1941  Procedure: Left Heart Cath, Selective Coronary Angiography, LV angiography  Indication:   H/O CAD, old inferior MI and PCI of the RCA 2001, presenting with worsening chest pain.  Procedural details: The right groin was prepped, draped, and anesthetized with 1% lidocaine. Using modified Seldinger technique, a 5 French sheath was introduced into the right femoral artery. Standard Judkins catheters were used for coronary angiography and left ventriculography. Catheter exchanges were performed over a guidewire. There were no immediate procedural complications. The patient was transferred to the post catheterization recovery area for further monitoring.  Procedural Findings:  Hemodynamics:     AO 107/50/71    LV 139/12/15   Coronary angiography:  Coronary dominance: Right  Left mainstem:   Moderate to large size vessel that bifurcates to the LAD and left circumflex. No significant disease noted.  Left anterior descending (LAD):   LAD is a moderate to large size vessel with mild to moderate luminal irregularities. No severe stenoses noted. No significant diagonal branches noted, only proximal septal branching vessel.   Left circumflex (LCx):  Moderate to large size vessel with high branching OM. Proximal OM has no significant disease. After the bifurcation, the inferior branch has moderate proximal disease. The distal region of the branch has bifurcations.   Right coronary artery (RCA):  RCA is a moderate to large size vessel, dominant branch with patent stent in the mid region of the vessel. There is mild disease at the distal region of the stent. PDA and PL branches with mild luminal irregularities.  Left ventriculography: Left ventricular systolic function is normal, LVEF is estimated at 55-65%, there is no significant mitral regurgitation   Final Conclusions:   Moderate proximal OM disease  (inferior branch). Mild luminal irregularities of the LAD and RCA with patent RCA stent. 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 No significant aortic valve stenosis or MR.  Recommendations:  The case was discussed with Dr. Excell Seltzer. The OM lesion will be managed medically. We'll continue aggressive cholesterol management, aspirin.  Julien Nordmann 06/24/2011, 12:36 PM

## 2011-06-24 NOTE — Progress Notes (Signed)
Bedrest begins @ 1345.  Tegaderm dressing applied, right groin level 0.

## 2011-06-24 NOTE — Progress Notes (Signed)
Discharge instructions completed, patient voiced verbal understanding.  Ambulated to bathroom without bleeding from right groin site.  Discharged to home via wheelchair and with son as the driver.

## 2011-06-24 NOTE — Progress Notes (Signed)
Point of care INR drawn, results of 166.  Dr. Mariah Milling in to discuss results with patient and family.

## 2011-07-08 ENCOUNTER — Ambulatory Visit: Payer: Self-pay | Admitting: Gastroenterology

## 2011-07-10 LAB — PATHOLOGY REPORT

## 2011-07-29 ENCOUNTER — Ambulatory Visit: Payer: Medicare Other | Admitting: Cardiovascular Disease

## 2011-08-01 ENCOUNTER — Encounter: Payer: Self-pay | Admitting: Cardiovascular Disease

## 2011-08-01 ENCOUNTER — Ambulatory Visit (INDEPENDENT_AMBULATORY_CARE_PROVIDER_SITE_OTHER): Payer: Medicare Other | Admitting: Cardiovascular Disease

## 2011-08-01 DIAGNOSIS — E785 Hyperlipidemia, unspecified: Secondary | ICD-10-CM

## 2011-08-01 DIAGNOSIS — I2510 Atherosclerotic heart disease of native coronary artery without angina pectoris: Secondary | ICD-10-CM

## 2011-08-01 DIAGNOSIS — I252 Old myocardial infarction: Secondary | ICD-10-CM

## 2011-08-01 NOTE — Progress Notes (Signed)
Patient ID: Randall Ali, male    DOB: Apr 13, 1941, 71 y.o.   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 Previous admit in 2011 for for mild chest pain and sweating. Stress test showed no ischemia with inferior scar. He presents after recent cardiac catheterization showing moderate OM disease.   On his last clinic visit, he was having worsening chest pain medically with exertion. He had a cardiac catheterization that showed moderate obtuse marginal disease, otherwise no significant stenosis. This was discussed with the interventional physicians in [facility] and recommendation was made for medical management.  He presents today and reports that he otherwise feels well. He is recovering from a 24-hour gastroenteritis. Blood pressure at home is typically 130/60 Total cholesterol 130s He reports having an EGD recently that showed an ulcer. He continues to have trouble swallowing.   EKG shows normal sinus rhythm with rate 62 beats per minute, no significant ST or T wave changes    Outpatient Encounter Prescriptions as of 08/01/2011  Medication Sig Dispense Refill  . aspirin 81 MG EC tablet Take 81 mg by mouth daily.        . Calcium-Vitamin D (RA CALCIUM PLUS VITAMIN D) 600-125 MG-UNIT TABS Take 1 tablet by mouth daily.        . Cholecalciferol (VITAMIN D3) 400 UNITS CAPS Take 1 capsule by mouth daily.        . metoprolol succinate (TOPROL-XL) 25 MG 24 hr tablet Take 12.5 mg by mouth daily.       . nitroGLYCERIN (NITROSTAT) 0.4 MG SL tablet Place 1 tablet (0.4 mg total) under the tongue every 5 (five) minutes as needed for chest pain.  25 tablet  11  . pantoprazole (PROTONIX) 40 MG tablet Take 40 mg by mouth daily.      . simvastatin (ZOCOR) 20 MG tablet Take 20 mg by mouth daily.        . vitamin B-12 (CYANOCOBALAMIN) 1000 MCG  tablet Take 1,000 mcg by mouth daily.        . Zinc 100 MG TABS Take 1 tablet by mouth daily.           Review of Systems  Constitutional: Negative.   HENT: Negative.   Eyes: Negative.   Respiratory: Negative.   Gastrointestinal: Negative.   Musculoskeletal: Negative.   Skin: Negative.   Neurological: Negative.   Hematological: Negative.   Psychiatric/Behavioral: Negative.   All other systems reviewed and are negative.    BP 158/92  Pulse 62  Ht 5\' 10"  (1.778 m)  Wt 209 lb (94.802 kg)  BMI 29.99 kg/m2  Physical Exam  Nursing note and vitals reviewed. Constitutional: He is oriented to person, place, and time. He appears well-developed and well-nourished.  HENT:  Head: Normocephalic.  Nose: Nose normal.  Mouth/Throat: Oropharynx is clear and moist.  Eyes: Conjunctivae are normal. Pupils are equal, round, and reactive to light.  Neck: Normal range of motion. Neck supple. No JVD present.  Cardiovascular: Normal rate, regular rhythm, S1 normal, S2 normal, normal heart sounds and intact distal pulses.  Exam reveals no gallop and no friction rub.   No murmur heard. Pulmonary/Chest: Effort normal and breath sounds normal. No respiratory distress. He has no wheezes. He has no rales. He exhibits no tenderness.  Abdominal: Soft. Bowel sounds are normal. He exhibits no distension. There  is no tenderness.  Musculoskeletal: Normal range of motion. He exhibits no edema and no tenderness.  Lymphadenopathy:    He has no cervical adenopathy.  Neurological: He is alert and oriented to person, place, and time. Coordination normal.  Skin: Skin is warm and dry. No rash noted. No erythema.  Psychiatric: He has a normal mood and affect. His behavior is normal. Judgment and thought content normal.           Assessment and Plan

## 2011-08-01 NOTE — Assessment & Plan Note (Signed)
Cholesterol is at goal on the current lipid regimen. No changes to the medications were made.  

## 2011-08-01 NOTE — Patient Instructions (Signed)
You are doing well. No medication changes were made.  Please call us if you have new issues that need to be addressed before your next appt.  Your physician wants you to follow-up in: 12 months.  You will receive a reminder letter in the mail two months in advance. If you don't receive a letter, please call our office to schedule the follow-up appointment. 

## 2011-08-01 NOTE — Assessment & Plan Note (Signed)
Currently with no symptoms of angina. S/p cardiac cath. Continue current medication regimen.

## 2011-08-11 ENCOUNTER — Telehealth: Payer: Self-pay | Admitting: Cardiovascular Disease

## 2011-08-11 NOTE — Telephone Encounter (Signed)
He could take NTG sl PRN We could also try imdur 30 mg daily.

## 2011-08-11 NOTE — Telephone Encounter (Signed)
Randall Ali is complaining of chest pain more frequently.  He states he gets it whenever he gets warm or when he exerts himself.  He denies sob, nausea or sweating with the chest pain.  He states it lasts a few minutes and is relieved with rest.  The pain is on the left side of his chest without radiation and is an 8/10 in severity.  He states he does not take nitro with it because he doesn't remember it.

## 2011-08-11 NOTE — Telephone Encounter (Signed)
Pt calling back wants call returned to [phone]

## 2011-08-11 NOTE — Telephone Encounter (Signed)
Pt was notified and chooses to try to remember to take the nitro when he has chest pain.

## 2011-08-11 NOTE — Telephone Encounter (Signed)
Pt calling states that he is having chest pain more frequently. Feels that they may be worse with exertion. Pt wants to know what he should do

## 2011-08-19 ENCOUNTER — Ambulatory Visit: Payer: Self-pay | Admitting: Gastroenterology

## 2011-08-21 LAB — PATHOLOGY REPORT

## 2011-10-16 ENCOUNTER — Other Ambulatory Visit: Payer: Self-pay | Admitting: *Deleted

## 2011-10-16 ENCOUNTER — Other Ambulatory Visit: Payer: Self-pay | Admitting: Cardiovascular Disease

## 2011-10-16 MED ORDER — METOPROLOL SUCCINATE ER 25 MG PO TB24: 12.5000 mg | ORAL_TABLET | Freq: Every day | ORAL | Status: DC

## 2011-10-16 NOTE — Telephone Encounter (Signed)
Refilled Metoprolol

## 2011-12-12 ENCOUNTER — Ambulatory Visit: Payer: Medicare Other | Admitting: Internal Medicine

## 2011-12-24 ENCOUNTER — Encounter: Payer: Self-pay | Admitting: Internal Medicine

## 2011-12-24 ENCOUNTER — Ambulatory Visit (INDEPENDENT_AMBULATORY_CARE_PROVIDER_SITE_OTHER): Payer: Medicare Other | Admitting: Internal Medicine

## 2011-12-24 VITALS — BP 112/62 | HR 64 | Temp 97.6°F | Ht 71.0 in | Wt 208.0 lb

## 2011-12-24 DIAGNOSIS — R1310 Dysphagia, unspecified: Secondary | ICD-10-CM

## 2011-12-24 DIAGNOSIS — R079 Chest pain, unspecified: Secondary | ICD-10-CM

## 2011-12-24 DIAGNOSIS — R911 Solitary pulmonary nodule: Secondary | ICD-10-CM | POA: Insufficient documentation

## 2011-12-24 DIAGNOSIS — I10 Essential (primary) hypertension: Secondary | ICD-10-CM

## 2011-12-24 DIAGNOSIS — E785 Hyperlipidemia, unspecified: Secondary | ICD-10-CM

## 2011-12-24 DIAGNOSIS — I2510 Atherosclerotic heart disease of native coronary artery without angina pectoris: Secondary | ICD-10-CM

## 2011-12-24 NOTE — Patient Instructions (Addendum)
Flush your sinuses twice daily starting 2 weeks before your allergy season ,  Use sterile saline .  This will prevent sinus infections.    Metamucil, Fibercon, citrucel,  Or Miralax can be taken daily to manage or prevent constipation.  Do not take within 2 hours of your medicines to avoid interference.   Try Mission Carb Balance tortillas to increase the fiber in your diet. available everywhere ([employer]. HT,  And [employer] and [employer])   We will refer to [name] for persisent dysphagia for solid foods.   Return for fasting lipids and Pre exam PSA, etc.   Return for annual Medicare exam./    Sign the release  Form tor the  [facility] clinic records

## 2011-12-24 NOTE — Progress Notes (Addendum)
Patient ID: Randall Ali, male   DOB: 13-Nov-1940, 71 y.o.   MRN: [id number]  Patient Active Problem List  Diagnosis  . HYPERLIPIDEMIA-MIXED  . OLD MYOCARDIAL INFARCTION  . CAD, NATIVE VESSEL  . CHEST PAIN-UNSPECIFIED  . Dysphagia  . Pulmonary nodule  . HLD (hyperlipidemia)  . HTN (hypertension)  . CAD (coronary artery disease)    Subjective:  CC:   Chief Complaint  Patient presents with  . Establish Care    New Pt to establish    HPI:   Randall Ali is a 71 y.o. male who presents as a new patient to establish primary care with the chief complaint of left sided chest pain aggravated by walking. Due to history of CAD with history of inferior MI status post stent  placed in RCA in 2001 by Paraschos/Kowalski,  he underwent a cardiac catheterization a at [facility] recently. According to patient his RCA stent was patent and there were no other significant stenoses. He continues to have left-sided chest pain with exertion but only during the initial few minutes of walking. He works out vigorously at a gym 4-5 times a week and states that he never has the pain when he is working out at [employer] . He  is transferring care from Dr. Wonda Cheng, and his new cardiologist is Dossie Arbour.   Past Medical History  Diagnosis Date  . CAD (coronary artery disease)     s/p inferior wall infarct in 10/01. has stent in Rt coronary artery. is due a stress myoview. does have some dyspnea on exertion  . SOB (shortness of breath) on exertion   . 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 Chest pain   . HLD (hyperlipidemia)     isdue followup lipids. on zocor 10 mg/day   . HTN (hypertension)     Past Surgical History  Procedure Date  . Cardiac catheterization 06/24/11  . Carotid stent 03/10/2011  . Arm surgery 2010  . Shoulder arthroscopy 2012  . Wrist surgery 2011  . Toe surgery 1994  . Nasal sinus surgery     Family History  Problem Relation Age of Onset  . Hypertension Mother   .  Heart disease Mother   . Hypertension Father   . Diabetes Father   . Cancer Paternal Grandfather     History   Social History  . Marital Status: Single    Spouse Name: N/A    Number of Children: N/A  . Years of Education: 12   Occupational History  . retired     [employer]   Social History Main Topics  . Smoking status: Former Smoker -- 1.0 packs/day    Types: Cigarettes    Quit date: 03/24/2000  . Smokeless tobacco: Not on file   Comment: tobacco use - no   . Alcohol Use: 6.0 oz/week    10 Shots of liquor per week  . Drug Use: No  . Sexually Active: Not on file   Other Topics Concern  . Not on file   Social History Narrative   Singled; retired, part time; gets regular exercise. Caffeine Use-yes    No Known Allergies  Review of Systems: Review of Systems  Constitutional: Negative for fever, chills, weight loss and malaise/fatigue.  HENT: Negative for tinnitus.   Eyes: Negative for blurred vision and double vision.  Respiratory: Negative for cough, hemoptysis, shortness of breath and stridor.   Cardiovascular: Positive for chest pain. Negative for palpitations, orthopnea, claudication and  PND.  Gastrointestinal: Negative for heartburn, nausea, vomiting and abdominal pain.  Genitourinary: Negative for dysuria, frequency and flank pain.  Musculoskeletal: Negative for myalgias, back pain and falls.  Skin: Negative for rash.  Neurological: Negative for dizziness, sensory change, focal weakness and headaches.  Endo/Heme/Allergies: Bruises/bleeds easily.  Psychiatric/Behavioral: Negative for depression, hallucinations and memory loss.        Objective:  BP 112/62  Pulse 64  Temp 97.6 F (36.4 C) (Oral)  Ht 5\' 11"  (1.803 m)  Wt 208 lb (94.348 kg)  BMI 29.01 kg/m2  SpO2 95%  General appearance: alert, cooperative and appears stated age Ears: normal TM's and external ear canals both ears Throat: lips, mucosa, and tongue normal; teeth and gums normal Neck:  no adenopathy, no carotid bruit, supple, symmetrical, trachea midline and thyroid not enlarged, symmetric, no tenderness/mass/nodules Back: symmetric, no curvature. ROM normal. No CVA tenderness. Lungs: clear to auscultation bilaterally Heart: regular rate and rhythm, S1, S2 normal, no murmur, click, rub or gallop Abdomen: soft, non-tender; bowel sounds normal; no masses,  no organomegaly Pulses: 2+ and symmetric Skin: Skin color, texture, turgor normal. No rashes or lesions Lymph nodes: Cervical, supraclavicular, and axillary nodes normal.  Assessment and Plan:  CHEST PAIN-UNSPECIFIED His pain is atypical and has been evaluated with a repeat cardiac cath in the last several months with no signs of restenosis or progressive disease. Etiology may be costochondritis or other chest wall enthesopathy or other noncardiac cause since it is not brought on by vigorous exercise.  Reassurance provided.   Pulmonary nodule 6 mm, Found incidentally on chest CT done  during September 2011 [facility] admission for chest pain . Repeat CT SEpt 2012 at [facility] showed no progression and minimal nodular density. It is unclear how long these should be continued. He does have a history of prior tobacco use and has not seen a pulmonologist.    Will repeat this September and follow have him followup with Dr. Kendrick Fries for his opinion.    HYPERLIPIDEMIA-MIXED  Managed with Zocor, LDL goal of 70 given his history of coronary artery disease.  He has not had fasting lipids or LFTs in over 6 months. I've asked him to return as soon as possible have these done.  Dysphagia Secondary to erosive reflux esophagitis by EGD in January 2013 at [facility] with repeat scope in March of 2011 showing resolution of inflammation. No strictures were found. Barium swallow was nomral. Currently asymptomatic.  Continue PPI daily.  HTN (hypertension) well controlled on current regimen. He has not had renal function checked in 6 months return for this  when he does assessing lipids.    Updated Medication List Outpatient Encounter Prescriptions as of 12/24/2011  Medication Sig Dispense Refill  . aspirin 81 MG EC tablet Take 81 mg by mouth daily.        . Calcium-Vitamin D (RA CALCIUM PLUS VITAMIN D) 600-125 MG-UNIT TABS Take 1 tablet by mouth daily.        . Cholecalciferol (VITAMIN D3) 400 UNITS CAPS Take 1 capsule by mouth daily.        . metoprolol succinate (TOPROL-XL) 25 MG 24 hr tablet Take 0.5 tablets (12.5 mg total) by mouth daily.  30 tablet  6  . pantoprazole (PROTONIX) 40 MG tablet Take 40 mg by mouth daily.      . simvastatin (ZOCOR) 20 MG tablet Take 20 mg by mouth daily.        . vitamin B-12 (CYANOCOBALAMIN) 1000 MCG tablet Take 1,000  mcg by mouth daily.        . Zinc 100 MG TABS Take 1 tablet by mouth daily.        . nitroGLYCERIN (NITROSTAT) 0.4 MG SL tablet Place 1 tablet (0.4 mg total) under the tongue every 5 (five) minutes as needed for chest pain.  25 tablet  11     Orders Placed This Encounter  Procedures  . CT Chest W Contrast  . Ambulatory referral to Pulmonology  . HM COLONOSCOPY    No Follow-up on file.

## 2011-12-27 ENCOUNTER — Encounter: Payer: Self-pay | Admitting: Internal Medicine

## 2011-12-27 DIAGNOSIS — E785 Hyperlipidemia, unspecified: Secondary | ICD-10-CM | POA: Insufficient documentation

## 2011-12-27 NOTE — Assessment & Plan Note (Addendum)
Managed with Zocor, LDL goal of 70 given his history of coronary artery disease.  He has not had fasting lipids or LFTs in over 6 months. I've asked him to return as soon as possible have these done.

## 2011-12-27 NOTE — Assessment & Plan Note (Deleted)
His pain is atypical and has been evaluated with a repeat cardiac cath in the last several months with no signs of restenosis or progressive disease. Etiology may be costochondritis or other chest wall enthesopathy or other noncardiac cause since it is not brought on by vigorous exercise.  Reassurance provided.  

## 2011-12-27 NOTE — Assessment & Plan Note (Signed)
Secondary to erosive esophagitis by EGD in January 2011 at [facility] with repeat scope in March of 2011 showing resolution of inflammation. No strictures were found. Continue PPI daily.

## 2011-12-27 NOTE — Assessment & Plan Note (Signed)
His pain is atypical and has been evaluated with a repeat cardiac cath in the last several months with no signs of restenosis or progressive disease. Etiology may be costochondritis or other chest wall enthesopathy or other noncardiac cause since it is not brought on by vigorous exercise.  Reassurance provided.

## 2011-12-27 NOTE — Assessment & Plan Note (Signed)
6 mm, Found incidentally on chest CT done  during September 2011 [facility] admission for chest pain . Repeat CT SEpt 2012 at [facility] showed no progression and minimal nodular density. It is unclear how long these should be continued. He does have a history of prior tobacco use and has not seen a pulmonologist.    Will repeat this September and follow have him followup with Dr. Kendrick Fries for his opinion.

## 2011-12-27 NOTE — Assessment & Plan Note (Signed)
well controlled on current regimen. He has not had renal function checked in 6 months return for this when he does assessing lipids.

## 2011-12-29 ENCOUNTER — Telehealth: Payer: Self-pay | Admitting: Family Medicine

## 2011-12-29 DIAGNOSIS — I10 Essential (primary) hypertension: Secondary | ICD-10-CM

## 2011-12-29 NOTE — Addendum Note (Signed)
Addended by: Jobie Quaker on: 12/29/2011 04:09 PM   Modules accepted: Orders

## 2011-12-29 NOTE — Telephone Encounter (Signed)
Please put in an order for a Creatinine for this patient he is having a CT Scan done on Monday  7.29.13 he will come by the office for blood work on tomorrow 7.23.13.

## 2011-12-29 NOTE — Telephone Encounter (Signed)
Lab ordered.

## 2011-12-30 ENCOUNTER — Other Ambulatory Visit (INDEPENDENT_AMBULATORY_CARE_PROVIDER_SITE_OTHER): Payer: Medicare Other | Admitting: *Deleted

## 2011-12-30 DIAGNOSIS — I10 Essential (primary) hypertension: Secondary | ICD-10-CM

## 2011-12-31 ENCOUNTER — Other Ambulatory Visit: Payer: Medicare Other

## 2012-01-02 ENCOUNTER — Other Ambulatory Visit (INDEPENDENT_AMBULATORY_CARE_PROVIDER_SITE_OTHER): Payer: Medicare Other | Admitting: *Deleted

## 2012-01-02 DIAGNOSIS — Z01818 Encounter for other preprocedural examination: Secondary | ICD-10-CM

## 2012-01-02 LAB — COMPREHENSIVE METABOLIC PANEL
ALT: 21 U/L (ref 0–53)
AST: 21 U/L (ref 0–37)
Albumin: 4 g/dL (ref 3.5–5.2)
Alkaline Phosphatase: 47 U/L (ref 39–117)
BUN: 18 mg/dL (ref 6–23)
CO2: 26 mEq/L (ref 19–32)
Calcium: 9.3 mg/dL (ref 8.4–10.5)
Chloride: 99 mEq/L (ref 96–112)
Creatinine, Ser: 1 mg/dL (ref 0.4–1.5)
GFR: 78.55 mL/min (ref >60.00)
Glucose, Bld: 78 mg/dL (ref 70–99)
Potassium: 4.5 mEq/L (ref 3.5–5.1)
Sodium: 135 mEq/L (ref 135–145)
Total Bilirubin: 0.8 mg/dL (ref 0.3–1.2)
Total Protein: 6.5 g/dL (ref 6.0–8.3)

## 2012-01-05 ENCOUNTER — Ambulatory Visit: Payer: Self-pay | Admitting: Internal Medicine

## 2012-01-06 ENCOUNTER — Telehealth: Payer: Self-pay | Admitting: Internal Medicine

## 2012-01-06 NOTE — Telephone Encounter (Signed)
Chest Ct shows a stable Right upper lobe nodule.

## 2012-01-13 ENCOUNTER — Encounter: Payer: Self-pay | Admitting: Internal Medicine

## 2012-01-19 NOTE — Telephone Encounter (Signed)
Has patient been informed of CT findings?

## 2012-01-20 NOTE — Telephone Encounter (Signed)
Left detailed message notifying patient.

## 2012-02-19 ENCOUNTER — Other Ambulatory Visit (INDEPENDENT_AMBULATORY_CARE_PROVIDER_SITE_OTHER): Payer: Medicare Other | Admitting: *Deleted

## 2012-02-19 ENCOUNTER — Telehealth: Payer: Self-pay | Admitting: *Deleted

## 2012-02-19 DIAGNOSIS — Z79899 Other long term (current) drug therapy: Secondary | ICD-10-CM

## 2012-02-19 DIAGNOSIS — M25532 Pain in left wrist: Secondary | ICD-10-CM | POA: Insufficient documentation

## 2012-02-19 DIAGNOSIS — E785 Hyperlipidemia, unspecified: Secondary | ICD-10-CM

## 2012-02-19 DIAGNOSIS — Z0000 Encounter for general adult medical examination without abnormal findings: Secondary | ICD-10-CM

## 2012-02-19 LAB — CBC WITH DIFFERENTIAL/PLATELET
Basophils Absolute: 0 10*3/uL (ref 0.0–0.1)
Basophils Relative: 0.7 % (ref 0.0–3.0)
Eosinophils Absolute: 0.1 10*3/uL (ref 0.0–0.7)
Eosinophils Relative: 1.6 % (ref 0.0–5.0)
HCT: 45.2 % (ref 39.0–52.0)
Hemoglobin: 14.7 g/dL (ref 13.0–17.0)
Lymphocytes Relative: 29.5 % (ref 12.0–46.0)
Lymphs Abs: 1.5 10*3/uL (ref 0.7–4.0)
MCHC: 32.6 g/dL (ref 30.0–36.0)
MCV: 93.6 fl (ref 78.0–100.0)
Monocytes Absolute: 0.7 10*3/uL (ref 0.1–1.0)
Monocytes Relative: 12.7 % — ABNORMAL HIGH (ref 3.0–12.0)
Neutro Abs: 2.9 10*3/uL (ref 1.4–7.7)
Neutrophils Relative %: 55.5 % (ref 43.0–77.0)
Platelets: 168 10*3/uL (ref 150.0–400.0)
RBC: 4.83 Mil/uL (ref 4.22–5.81)
RDW: 12.9 % (ref 11.5–14.6)
WBC: 5.2 10*3/uL (ref 4.5–10.5)

## 2012-02-19 NOTE — Telephone Encounter (Signed)
Fasting lipids,  CMET. TSH and CBC w diff  Thanks

## 2012-02-19 NOTE — Telephone Encounter (Signed)
Patient came in for labs today , what would you like for me to order? 

## 2012-02-19 NOTE — Telephone Encounter (Signed)
Labs ordered.

## 2012-02-20 LAB — COMPREHENSIVE METABOLIC PANEL
ALT: 22 U/L (ref 0–53)
AST: 22 U/L (ref 0–37)
Albumin: 4 g/dL (ref 3.5–5.2)
Alkaline Phosphatase: 46 U/L (ref 39–117)
BUN: 25 mg/dL — ABNORMAL HIGH (ref 6–23)
CO2: 23 mEq/L (ref 19–32)
Calcium: 9 mg/dL (ref 8.4–10.5)
Chloride: 110 mEq/L (ref 96–112)
Creatinine, Ser: 1.1 mg/dL (ref 0.4–1.5)
GFR: 74.22 mL/min (ref >60.00)
Glucose, Bld: 100 mg/dL — ABNORMAL HIGH (ref 70–99)
Potassium: 4.1 mEq/L (ref 3.5–5.1)
Sodium: 140 mEq/L (ref 135–145)
Total Bilirubin: 0.6 mg/dL (ref 0.3–1.2)
Total Protein: 6.6 g/dL (ref 6.0–8.3)

## 2012-02-20 LAB — LIPID PANEL
Cholesterol: 143 mg/dL (ref 0–200)
HDL: 49 mg/dL (ref >39.00)
LDL Cholesterol: 78 mg/dL (ref 0–99)
Total CHOL/HDL Ratio: 3
Triglycerides: 82 mg/dL (ref 0.0–149.0)
VLDL: 16.4 mg/dL (ref 0.0–40.0)

## 2012-02-20 LAB — TSH: TSH: 0.96 u[IU]/mL (ref 0.35–5.50)

## 2012-02-27 ENCOUNTER — Ambulatory Visit (INDEPENDENT_AMBULATORY_CARE_PROVIDER_SITE_OTHER): Payer: Medicare Other | Admitting: Internal Medicine

## 2012-02-27 ENCOUNTER — Encounter: Payer: Self-pay | Admitting: Internal Medicine

## 2012-02-27 VITALS — BP 128/70 | HR 64 | Temp 98.4°F | Resp 16 | Ht 69.0 in | Wt 208.5 lb

## 2012-02-27 DIAGNOSIS — Z23 Encounter for immunization: Secondary | ICD-10-CM

## 2012-02-27 DIAGNOSIS — E785 Hyperlipidemia, unspecified: Secondary | ICD-10-CM

## 2012-02-27 DIAGNOSIS — Z1211 Encounter for screening for malignant neoplasm of colon: Secondary | ICD-10-CM

## 2012-02-27 DIAGNOSIS — Z0000 Encounter for general adult medical examination without abnormal findings: Secondary | ICD-10-CM

## 2012-02-27 DIAGNOSIS — Z125 Encounter for screening for malignant neoplasm of prostate: Secondary | ICD-10-CM

## 2012-02-27 DIAGNOSIS — H9190 Unspecified hearing loss, unspecified ear: Secondary | ICD-10-CM

## 2012-02-27 DIAGNOSIS — N529 Male erectile dysfunction, unspecified: Secondary | ICD-10-CM

## 2012-02-27 LAB — PSA, MEDICARE: PSA: 0.26 ng/ml (ref 0.10–4.00)

## 2012-02-27 MED ORDER — ZOLPIDEM TARTRATE 10 MG PO TABS: 10.0000 mg | ORAL_TABLET | Freq: Every evening | ORAL | Status: DC | PRN

## 2012-02-27 MED ORDER — TADALAFIL 20 MG PO TABS: 20.0000 mg | ORAL_TABLET | Freq: Every day | ORAL | Status: DC | PRN

## 2012-02-27 NOTE — Assessment & Plan Note (Signed)
Annual exam was done today including testicular exam. He is uncircumcised there are no abnormalities on male shaft. Digital rectal exam was also done. He is up-to-date with all screenings. He has requested that I hearing exam since he is having trouble with hearing whispered voices. He will be referred to Dr. Mikey Bussing practice for audiology.

## 2012-02-27 NOTE — Progress Notes (Signed)
The patient is here for annual Medicare wellness examination and management of other chronic and acute problems. He is wearing a wrist splint on left forearm after injuring several ligaments from swinging a bat during a softball tournament.  He has a history of prior wrist surgery and elbow surgery same side and follows up with [facility] in October. He is gong to PT for the wrist  3 times weekly at [facility].  His pain is controlled with prn OTC NSAIDs and has  improved with avoidance of wrist motion     The risk factors are reflected in the social history.  The roster of all physicians providing medical care to patient - is listed in the Snapshot section of the chart.  Activities of daily living:  The patient is 100% independent in all ADLs: dressing, toileting, feeding as well as independent mobility  Home safety : The patient has smoke detectors in the home. They wear seatbelts.  There are no firearms at home. There is no violence in the home.   There is no risks for hepatitis, STDs or HIV. There is no   history of blood transfusion. They have no travel history to infectious disease endemic areas of the world.  The patient has seen their dentist in the last six month. They have seen their eye doctor in the last year. They admit to hearing difficulty with regard to whispered voices and some television programs.  They have deferred audiologic testing in the last year but have reqeusted it today.  They do not  have excessive sun exposure. He has had regular skin surveillance for cancer with Dr. 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   Diet: the importance of a healthy diet is discussed. They do have a healthy diet.  The benefits of regular aerobic exercise were discussed. He exercises 4 times per week ,  20 minutes.   Depression screen: there are no signs or vegative symptoms of depression- irritability, change in appetite, anhedonia,  sadness/tearfullness.  Cognitive assessment: the patient manages all their financial and personal affairs and is actively engaged. They could relate day,date,year and events; recalled 2/3 objects at 3 minutes; performed clock-face test normally.  The following portions of the patient's history were reviewed and updated as appropriate: allergies, current medications, past family history, past medical history,  past surgical history, past social history  and problem list.  Visual acuity was not assessed per patient preference since she has regular follow up with her ophthalmologist. Hearing and body mass index were assessed and reviewed.   During the course of the visit the patient was educated and counseled about appropriate screening and preventive services including : fall prevention , diabetes screening, nutrition counseling, colorectal cancer screening, and recommended immunizations.  Patient ID: Randall Ali, male   DOB: [birth year], 71 y.o.   MRN: [id number] 2011.   Objective  BP 128/70  Pulse 64  Temp 98.4 F (36.9 C) (Oral)  Resp 16  Ht 5\' 9"  (1.753 m)  Wt 208 lb 8 oz (94.575 kg)  BMI 30.79 kg/m2  SpO2 96%  General Appearance:    Alert, cooperative, no distress, appears stated age  Head:    Normocephalic, without obvious abnormality, atraumatic  Eyes:    PERRL, conjunctiva/corneas clear, EOM's intact, fundi    benign, both eyes       Ears:    Normal TM's and external ear canals, both ears  Nose:   Nares normal, septum midline,  mucosa normal, no drainage   or sinus tenderness  Throat:   Lips, mucosa, and tongue normal; teeth and gums normal  Neck:   Supple, symmetrical, trachea midline, no adenopathy;       thyroid:  No enlargement/tenderness/nodules; no carotid   bruit or JVD  Back:     Symmetric, no curvature, ROM normal, no CVA tenderness  Lungs:     Clear to auscultation bilaterally, respirations unlabored  Chest wall:    No tenderness or deformity  Heart:    Regular rate  and rhythm, S1 and S2 normal, no murmur, rub   or gallop  Abdomen:     Soft, non-tender, bowel sounds active all four quadrants,    no masses, no organomegaly  Genitalia:    Normal male without lesion, discharge or tenderness  Rectal:    Normal tone, normal prostate, no masses or tenderness;     Extremities:   Extremities normal, atraumatic, no cyanosis or edema  Pulses:   2+ and symmetric all extremities  Skin:   Skin color, texture, turgor normal, no rashes or lesions  Lymph nodes:   Cervical, supraclavicular, and axillary nodes normal  Neurologic:   CNII-XII intact. No    Assessment and Plan HYPERLIPIDEMIA-MIXED His LDL is less than 70 and his triglycerides were under 150. Continue current medications.  Screening for prostate cancer  digital rectal exam was normal today. PSA has been drawn.  Routine general medical examination at a health care facility Annual exam was done today including testicular exam. He is uncircumcised there are no abnormalities on male shaft. Digital rectal exam was also done. He is up-to-date with all screenings. He has requested that I hearing exam since he is having trouble with hearing whispered voices. He will be referred to Dr. Mikey Bussing practice for audiology.  Erectile dysfunction New problem developed of the last 2-3 months. The problem is episodic. He does wake up with a morning erection and is able to have erections but there does not as strong as they used to be. On testosterone level will be checked. Per request I have changed medication from Viagra to Cialis. He has been warned not to take this with nitroglycerin. He has not used his nitroglycerin in many many months.   Updated Medication List Outpatient Encounter Prescriptions as of 02/27/2012  Medication Sig Dispense Refill  . aspirin 81 MG EC tablet Take 81 mg by mouth daily.        . Calcium-Vitamin D (RA CALCIUM PLUS VITAMIN D) 600-125 MG-UNIT TABS Take 1 tablet by mouth daily.        .  Cholecalciferol (VITAMIN D3) 400 UNITS CAPS Take 1 capsule by mouth daily.        . metoprolol succinate (TOPROL-XL) 25 MG 24 hr tablet Take 0.5 tablets (12.5 mg total) by mouth daily.  30 tablet  6  . nitroGLYCERIN (NITROSTAT) 0.4 MG SL tablet Place 1 tablet (0.4 mg total) under the tongue every 5 (five) minutes as needed for chest pain.  25 tablet  11  . simvastatin (ZOCOR) 20 MG tablet Take 20 mg by mouth daily.        . vitamin B-12 (CYANOCOBALAMIN) 1000 MCG tablet Take 1,000 mcg by mouth daily.        . Zinc 100 MG TABS Take 1 tablet by mouth daily.        . tadalafil (CIALIS) 20 MG tablet Take 1 tablet (20 mg total) by mouth daily as needed for erectile dysfunction.  10 tablet  0  . zolpidem (AMBIEN) 10 MG tablet Take 1 tablet (10 mg total) by mouth at bedtime as needed for sleep.  15 tablet  1  . DISCONTD: pantoprazole (PROTONIX) 40 MG tablet Take 40 mg by mouth daily.

## 2012-02-27 NOTE — Assessment & Plan Note (Signed)
His LDL is less than 70 and his triglycerides were under 150. Continue current medications.

## 2012-02-27 NOTE — Assessment & Plan Note (Signed)
digital rectal exam was normal today. PSA has been drawn.

## 2012-02-27 NOTE — Assessment & Plan Note (Signed)
New problem developed of the last 2-3 months. The problem is episodic. He does wake up with a morning erection and is able to have erections but there does not as strong as they used to be. On testosterone level will be checked. Per request I have changed medication from Viagra to Cialis. He has been warned not to take this with nitroglycerin. He has not used his nitroglycerin in many many months.

## 2012-03-01 LAB — TESTOSTERONE, FREE, TOTAL, SHBG
Sex Hormone Binding: 75 nmol/L — ABNORMAL HIGH (ref 13–71)
Testosterone, Free: 58.3 pg/mL (ref 47.0–244.0)
Testosterone-% Free: 1.2 % — ABNORMAL LOW (ref 1.6–2.9)
Testosterone: 500.56 ng/dL (ref 300–890)

## 2012-03-02 ENCOUNTER — Other Ambulatory Visit: Payer: Self-pay | Admitting: *Deleted

## 2012-03-02 NOTE — Telephone Encounter (Signed)
Rx from 02/27/2012 called to [facility].  #15 with 1 refill.

## 2012-03-04 NOTE — Addendum Note (Signed)
Addended by: Duncan Dull on: 03/04/2012 03:03 PM   Modules accepted: Orders

## 2012-03-08 ENCOUNTER — Institutional Professional Consult (permissible substitution): Payer: Medicare Other | Admitting: Pulmonary Disease

## 2012-04-06 ENCOUNTER — Encounter: Payer: Self-pay | Admitting: Pulmonary Disease

## 2012-04-06 ENCOUNTER — Ambulatory Visit (INDEPENDENT_AMBULATORY_CARE_PROVIDER_SITE_OTHER): Payer: Medicare Other | Admitting: Pulmonary Disease

## 2012-04-06 VITALS — BP 112/62 | HR 66 | Temp 97.8°F | Ht 69.0 in | Wt 208.0 lb

## 2012-04-06 DIAGNOSIS — R911 Solitary pulmonary nodule: Secondary | ICD-10-CM

## 2012-04-06 DIAGNOSIS — R079 Chest pain, unspecified: Secondary | ICD-10-CM

## 2012-04-06 NOTE — Patient Instructions (Addendum)
Your chest CTs have not shown any growth in the small nodules which is good If you develop new chest pain, shortness of breath or coughing up blood let us know

## 2012-04-06 NOTE — Progress Notes (Signed)
Subjective:    Patient ID: Randall Ali, male    DOB: 02/11/1941, 71 y.o.   MRN: [id number]  HPI This is a very pleasant 71 year old male who has a past medical history significant for smoking 3 packs of cigarettes daily for 40 years who is referred to [place] for evaluation of pulmonary nodules. As part of evaluation for chest pain shortness of breath in September of 2011 Randall Ali underwent a CT chest with contrast at [facility]. This demonstrated several small nodules throughout his lungs all of which were less than 5 mm in size. It was recommended that he followup with a CT of his chest one year later. In September of 2012 a repeat CT chest without contrast was performed and showed no growth. He established care with Dr. Darrick Huntsman here in 2013 and in July of 2013 he underwent a repeat CT chest which again showed no growth of these nodules.  He states that he plays softball on a regular basis and has no shortness of breath with this. In fact, interestingly, he tells me that he has been inducted into the softball [address] [place] in [place]. 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 With this he gets no shortness of breath or chest pain. However he has had a very predictable left-sided sharp chest pain for the last several years always on initiation of walking. The pain lasts for 5 minutes and then goes away. He's been evaluated by his cardiologist Dr. Sherryle Lis for this and there is been no evidence of ongoing coronary disease. He had a PCI performed in 2001 and apparently this was patent on his most recent left heart catheterization.   Past Medical History  Diagnosis Date  . CAD (coronary artery disease)     s/p inferior wall infarct in 10/01. has stent in Rt coronary artery. is due a stress myoview. does have some dyspnea on exertion  . SOB (shortness of breath) on exertion   . 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 Chest pain   . HLD  (hyperlipidemia)     isdue followup lipids. on zocor 10 mg/day   . HTN (hypertension)      Family History  Problem Relation Age of Onset  . Hypertension Mother   . Heart disease Mother   . Hypertension Father   . Diabetes Father   . Cancer Paternal Grandfather      History   Social History  . Marital Status: Single    Spouse Name: N/A    Number of Children: N/A  . Years of Education: 12   Occupational History  . retired     [employer]   Social History Main Topics  . Smoking status: Former Smoker -- 2.5 packs/day for 40 years    Types: Cigarettes    Quit date: 03/24/2000  . Smokeless tobacco: Never Used  . Alcohol Use: 6.0 oz/week    10 Shots of liquor per week  . Drug Use: No  . Sexually Active: Not on file   Other Topics Concern  . Not on file   Social History Narrative   Singled; retired, part time; gets regular exercise. Caffeine Use-yes     No Known Allergies   Outpatient Prescriptions Prior to Visit  Medication Sig Dispense Refill  . aspirin 81 MG EC tablet Take 81 mg by mouth daily.        . Calcium-Vitamin D (RA CALCIUM PLUS VITAMIN D) 600-125 MG-UNIT TABS  Take 1 tablet by mouth daily.        . Cholecalciferol (VITAMIN D3) 400 UNITS CAPS Take 1 capsule by mouth daily.        . metoprolol succinate (TOPROL-XL) 25 MG 24 hr tablet Take 0.5 tablets (12.5 mg total) by mouth daily.  30 tablet  6  . nitroGLYCERIN (NITROSTAT) 0.4 MG SL tablet Place 1 tablet (0.4 mg total) under the tongue every 5 (five) minutes as needed for chest pain.  25 tablet  11  . simvastatin (ZOCOR) 20 MG tablet Take 20 mg by mouth daily.        . tadalafil (CIALIS) 20 MG tablet Take 1 tablet (20 mg total) by mouth daily as needed for erectile dysfunction.  10 tablet  0  . vitamin B-12 (CYANOCOBALAMIN) 1000 MCG tablet Take 1,000 mcg by mouth daily.        . Zinc 100 MG TABS Take 1 tablet by mouth daily.        [name] zolpidem (AMBIEN) 10 MG tablet Take 1 tablet (10 mg total) by mouth at  bedtime as needed for sleep.  15 tablet  1       Review of Systems  Constitutional: Negative for fever, chills, activity change and appetite change.  HENT: Positive for sneezing. Negative for hearing loss, ear pain, congestion, rhinorrhea, neck pain, neck stiffness, postnasal drip and sinus pressure.   Eyes: Negative for redness, itching and visual disturbance.  Respiratory: Positive for cough. Negative for chest tightness, shortness of breath and wheezing.   Cardiovascular: Negative for chest pain, palpitations and leg swelling.  Gastrointestinal: Negative for nausea, vomiting, abdominal pain, diarrhea, constipation, blood in stool and abdominal distention.  Musculoskeletal: Positive for arthralgias. Negative for myalgias, joint swelling and gait problem.  Skin: Negative for rash.  Neurological: Negative for dizziness, light-headedness, numbness and headaches.  Hematological: Does not bruise/bleed easily.  Psychiatric/Behavioral: Negative for confusion and dysphoric mood.       Objective:   Physical Exam  Filed Vitals:   04/06/12 1416  BP: 112/62  Pulse: 66  Temp: 97.8 F (36.6 C)  TempSrc: Oral  Height: 5\' 9"  (1.753 m)  Weight: 208 lb (94.348 kg)  SpO2: 93%   Gen: well appearing, no acute distress HEENT: NCAT, PERRL, EOMi, OP clear, neck supple without masses PULM: CTA B CV: RRR, no mgr, no JVD AB: BS+, soft, nontender, no hsm Ext: warm, no edema, no clubbing, no cyanosis Derm: no rash or skin breakdown Neuro: A&Ox4, CN II-XII intact, strength 5/5 in all 4 extremities   July 2013 CT Chest>> several small nodules noted, most prominent in the superior segment of the LLL which is pleural based (has the appearance of a scar).       Assessment & Plan:   Pulmonary nodule Randall Ali has several small nodules in his lungs which are all less than 5mm in size and have not grown from 02/2012 through 12/2011.  He does not have further symptoms suggestive of lung cancer as his  weight has been stable.  He is a smoker who is at higher risk for developing lung cancer, but these results are reassuring.  We discussed the fact that he did not completely reach the 24 month, or two year interval between the images but he would prefer to not undergo another CT given his clinical stability.  I advised that he should call us if he develops new or different chest pain (other than his chronic chest pain), hemoptysis, or weight  loss.  CHEST PAIN-UNSPECIFIED After examining him and reviewing the Chest CT images I see no evidence of anything in his lungs that would cause the chest pain he has had for years.  I reassured him that with his normal LHC and Chest CT that it is likely musculoskeletal in origin.   Updated Medication List Outpatient Encounter Prescriptions as of 04/06/2012  Medication Sig Dispense Refill  . aspirin 81 MG EC tablet Take 81 mg by mouth daily.        . Calcium-Vitamin D (RA CALCIUM PLUS VITAMIN D) 600-125 MG-UNIT TABS Take 1 tablet by mouth daily.        . Cholecalciferol (VITAMIN D3) 400 UNITS CAPS Take 1 capsule by mouth daily.        . metoprolol succinate (TOPROL-XL) 25 MG 24 hr tablet Take 0.5 tablets (12.5 mg total) by mouth daily.  30 tablet  6  . nitroGLYCERIN (NITROSTAT) 0.4 MG SL tablet Place 1 tablet (0.4 mg total) under the tongue every 5 (five) minutes as needed for chest pain.  25 tablet  11  . simvastatin (ZOCOR) 20 MG tablet Take 20 mg by mouth daily.        . tadalafil (CIALIS) 20 MG tablet Take 1 tablet (20 mg total) by mouth daily as needed for erectile dysfunction.  10 tablet  0  . vitamin B-12 (CYANOCOBALAMIN) 1000 MCG tablet Take 1,000 mcg by mouth daily.        . Zinc 100 MG TABS Take 1 tablet by mouth daily.        [name] zolpidem (AMBIEN) 10 MG tablet Take 1 tablet (10 mg total) by mouth at bedtime as needed for sleep.  15 tablet  1

## 2012-04-06 NOTE — Assessment & Plan Note (Signed)
After examining him and reviewing the Chest CT images I see no evidence of anything in his lungs that would cause the chest pain he has had for years.  I reassured him that with his normal LHC and Chest CT that it is likely musculoskeletal in origin.

## 2012-04-06 NOTE — Assessment & Plan Note (Signed)
Randall Ali has several small nodules in his lungs which are all less than 5mm in size and have not grown from 02/2012 through 12/2011.  He does not have further symptoms suggestive of lung cancer as his weight has been stable.  He is a smoker who is at higher risk for developing lung cancer, but these results are reassuring.  We discussed the fact that he did not completely reach the 24 month, or two year interval between the images but he would prefer to not undergo another CT given his clinical stability.  I advised that he should call us if he develops new or different chest pain (other than his chronic chest pain), hemoptysis, or weight loss.

## 2012-05-27 ENCOUNTER — Other Ambulatory Visit: Payer: Self-pay

## 2012-05-27 MED ORDER — SIMVASTATIN 20 MG PO TABS: 20.0000 mg | ORAL_TABLET | Freq: Every day | ORAL | Status: DC

## 2012-05-27 NOTE — Telephone Encounter (Signed)
Patient request refill for Simvastatin 20 mg # 90 0 R sent to [facility]

## 2012-07-01 DIAGNOSIS — M7520 Bicipital tendinitis, unspecified shoulder: Secondary | ICD-10-CM | POA: Insufficient documentation

## 2012-07-01 DIAGNOSIS — M25521 Pain in right elbow: Secondary | ICD-10-CM | POA: Insufficient documentation

## 2012-07-01 DIAGNOSIS — M65311 Trigger thumb, right thumb: Secondary | ICD-10-CM | POA: Insufficient documentation

## 2012-08-02 ENCOUNTER — Ambulatory Visit (INDEPENDENT_AMBULATORY_CARE_PROVIDER_SITE_OTHER): Payer: Medicare Other | Admitting: Cardiovascular Disease

## 2012-08-02 ENCOUNTER — Encounter: Payer: Self-pay | Admitting: Cardiovascular Disease

## 2012-08-02 VITALS — BP 132/78 | HR 63 | Ht 70.0 in | Wt 213.8 lb

## 2012-08-02 DIAGNOSIS — I2510 Atherosclerotic heart disease of native coronary artery without angina pectoris: Secondary | ICD-10-CM

## 2012-08-02 DIAGNOSIS — E785 Hyperlipidemia, unspecified: Secondary | ICD-10-CM

## 2012-08-02 DIAGNOSIS — I10 Essential (primary) hypertension: Secondary | ICD-10-CM

## 2012-08-02 MED ORDER — SIMVASTATIN 20 MG PO TABS: 20.0000 mg | ORAL_TABLET | Freq: Every day | ORAL | Status: DC

## 2012-08-02 MED ORDER — METOPROLOL SUCCINATE ER 25 MG PO TB24: 12.5000 mg | ORAL_TABLET | Freq: Every day | ORAL | Status: DC

## 2012-08-02 NOTE — Assessment & Plan Note (Signed)
Stable. Continue ASA, BB, statin, NTG SL PRN. Counseled on NTG and Cialis use.  

## 2012-08-02 NOTE — Assessment & Plan Note (Signed)
Well-controlled. Continue Toprol-XL.

## 2012-08-02 NOTE — Assessment & Plan Note (Signed)
Stable. Continue ASA, BB, statin, NTG SL PRN. Counseled on NTG and Cialis use.

## 2012-08-02 NOTE — Progress Notes (Signed)
Patient ID: Randall Ali, male    DOB: [birth year], 72 y.o.   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 Previous admit in 2011 for for mild chest pain and sweating. Stress test showed no ischemia with inferior scar. He presents after recent cardiac catheterization in 2013 showing moderate OM disease.   On his last clinic visit, he was having worsening chest pain medically with exertion. He had a cardiac catheterization that showed moderate obtuse marginal disease, otherwise no significant stenosis. This was discussed with the interventional physicians in [facility] and recommendation was made for medical management.  He presents today and reports that he otherwise feels well. Blood pressure at home is typically 130/60. Today 132/78.   He reports having an EGD recently that showed an ulcer. He continues to have trouble swallowing.   EKG shows normal sinus rhythm with rate 63 beats per minute, no significant ST or T wave changes  Swallowing has been about the same. No difficulties. He has been seen by [facility], but has not followed up wit them. Goes to gym 4-5x/week, mostly weight-training. He continues to walk outdoors without incident. No episodes of worsening chest pain or shortness of breath. He still endorses left-sided sharp discomfort soon after starting activity, which resolves completely with ongoing exertion. He followed up with Dr. Kendrick Fries in 03/2012 for pulmonary nodules, which have been followed and found to be stable. He recently established care with Dr. Darrick Huntsman in 02/2012 including lab work. LDL 78, TC 143. TSH WNL. BMET, CBC WNL.    Outpatient Encounter Prescriptions as of 08/02/2012  Medication Sig Dispense Refill  . aspirin 81 MG EC tablet Take 81 mg by mouth daily.        . Calcium-Vitamin D (RA CALCIUM PLUS  VITAMIN D) 600-125 MG-UNIT TABS Take 1 tablet by mouth daily.        . Cholecalciferol (VITAMIN D3) 400 UNITS CAPS Take 1 capsule by mouth daily.        . metoprolol succinate (TOPROL-XL) 25 MG 24 hr tablet Take 0.5 tablets (12.5 mg total) by mouth daily.  45 tablet  3  . nitroGLYCERIN (NITROSTAT) 0.4 MG SL tablet Place 1 tablet (0.4 mg total) under the tongue every 5 (five) minutes as needed for chest pain.  25 tablet  11  . simvastatin (ZOCOR) 20 MG tablet Take 1 tablet (20 mg total) by mouth daily.  90 tablet  3  . tadalafil (CIALIS) 20 MG tablet Take 1 tablet (20 mg total) by mouth daily as needed for erectile dysfunction.  10 tablet  0  . vitamin B-12 (CYANOCOBALAMIN) 1000 MCG tablet Take 1,000 mcg by mouth daily.        . Zinc 100 MG TABS Take 1 tablet by mouth daily.        [name] zolpidem (AMBIEN) 10 MG tablet Take 1 tablet (10 mg total) by mouth at bedtime as needed for sleep.  15 tablet  1  . [DISCONTINUED] metoprolol succinate (TOPROL-XL) 25 MG 24 hr tablet Take 0.5 tablets (12.5 mg total) by mouth daily.  30 tablet  6  . [DISCONTINUED] simvastatin (ZOCOR) 20 MG tablet Take 1 tablet (20 mg total) by mouth daily.  90 tablet  0   No facility-administered encounter medications on file as of 08/02/2012.     Review of Systems  Constitutional: Negative.  HENT: Negative.   Eyes: Negative.   Respiratory: Negative.   Gastrointestinal: Negative.   Musculoskeletal: Negative.   Skin: Negative.   Neurological: Negative.   Psychiatric/Behavioral: Negative.   All other systems reviewed and are negative.    BP 132/78  Pulse 63  Ht 5\' 10"  (1.778 m)  Wt 213 lb 12 oz (96.956 kg)  BMI 30.67 kg/m2  Physical Exam  Nursing note and vitals reviewed. Constitutional: He is oriented to person, place, and time. He appears well-developed and well-nourished.  HENT:  Head: Normocephalic.  Nose: Nose normal.  Mouth/Throat: Oropharynx is clear and moist.  Eyes: Conjunctivae are normal. Pupils are  equal, round, and reactive to light.  Neck: Normal range of motion. Neck supple. No JVD present.  Cardiovascular: Normal rate, regular rhythm, S1 normal, S2 normal, normal heart sounds and intact distal pulses.  Exam reveals no gallop and no friction rub.   No murmur heard. Pulmonary/Chest: Effort normal and breath sounds normal. No respiratory distress. He has no wheezes. He has no rales. He exhibits no tenderness.  Abdominal: Soft. Bowel sounds are normal. He exhibits no distension. There is no tenderness.  Musculoskeletal: Normal range of motion. He exhibits no edema and no tenderness.  Lymphadenopathy:    He has no cervical adenopathy.  Neurological: He is alert and oriented to person, place, and time. Coordination normal.  Skin: Skin is warm and dry. No rash noted. No erythema.  Psychiatric: He has a normal mood and affect. His behavior is normal. Judgment and thought content normal.      Assessment and Plan

## 2012-08-02 NOTE — Patient Instructions (Addendum)
You are doing well. No medication changes were made.  Please call us if you have new issues that need to be addressed before your next appt.  Your physician wants you to follow-up in: 12 months.  You will receive a reminder letter in the mail two months in advance. If you don't receive a letter, please call our office to schedule the follow-up appointment. 

## 2012-08-02 NOTE — Assessment & Plan Note (Signed)
LDL 78, TC < 150. Continue simvastatin.

## 2012-08-02 NOTE — Progress Notes (Signed)
Patient ID: Randall Ali, male    DOB: 01/31/1941, 72 y.o.   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 Previous admit in 2011 for for mild chest pain and sweating. Stress test showed no ischemia with inferior scar. He presents after recent cardiac catheterization showing moderate OM disease.   In 2013, he was having worsening chest pain medically with exertion. He had a cardiac catheterization that showed moderate obtuse marginal disease, otherwise no significant stenosis. This was discussed with the interventional physicians in [facility] and recommendation was made for medical management. He continues to have rare chest pain on the left  He presents today and reports that he otherwise feels well. no further episodes of chest pain. Blood pressure at home is well-controlled Total cholesterol 140, LDL 78 prior EGD  that showed an ulcer. Previously had trouble swallowing.   EKG shows normal sinus rhythm with rate 63  beats per minute, no significant ST or T wave changes    Outpatient Encounter Prescriptions as of 08/02/2012  Medication Sig Dispense Refill  . aspirin 81 MG EC tablet Take 81 mg by mouth daily.        . Calcium-Vitamin D (RA CALCIUM PLUS VITAMIN D) 600-125 MG-UNIT TABS Take 1 tablet by mouth daily.        . Cholecalciferol (VITAMIN D3) 400 UNITS CAPS Take 1 capsule by mouth daily.        . metoprolol succinate (TOPROL-XL) 25 MG 24 hr tablet Take 0.5 tablets (12.5 mg total) by mouth daily.  30 tablet  6  . nitroGLYCERIN (NITROSTAT) 0.4 MG SL tablet Place 1 tablet (0.4 mg total) under the tongue every 5 (five) minutes as needed for chest pain.  25 tablet  11  . simvastatin (ZOCOR) 20 MG tablet Take 1 tablet (20 mg total) by mouth daily.  90 tablet  0  . tadalafil  (CIALIS) 20 MG tablet Take 1 tablet (20 mg total) by mouth daily as needed for erectile dysfunction.  10 tablet  0  . vitamin B-12 (CYANOCOBALAMIN) 1000 MCG tablet Take 1,000 mcg by mouth daily.        . Zinc 100 MG TABS Take 1 tablet by mouth daily.        [name] zolpidem (AMBIEN) 10 MG tablet Take 1 tablet (10 mg total) by mouth at bedtime as needed for sleep.  15 tablet  1   No facility-administered encounter medications on file as of 08/02/2012.     Review of Systems  Constitutional: Negative.   HENT: Negative.   Eyes: Negative.   Respiratory: Negative.   Cardiovascular: Positive for chest pain.  Gastrointestinal: Negative.   Musculoskeletal: Negative.   Skin: Negative.   Neurological: Negative.   Psychiatric/Behavioral: Negative.   All other systems reviewed and are negative.    BP 132/78  Pulse 63  Ht 5\' 10"  (1.778 m)  Wt 213 lb 12 oz (96.956 kg)  BMI 30.67 kg/m2  Physical Exam  Nursing note and vitals reviewed. Constitutional: He is oriented to person, place, and time. He appears well-developed and well-nourished.  HENT:  Head: Normocephalic.  Nose: Nose normal.  Mouth/Throat: Oropharynx is clear and moist.  Eyes: Conjunctivae are normal. Pupils are equal, round, and reactive to light.  Neck: Normal range of motion. Neck supple. No JVD present.  Cardiovascular: Normal rate, regular rhythm, S1 normal, S2 normal, normal heart sounds and intact distal pulses.  Exam reveals no gallop and no friction rub.   No murmur heard. Pulmonary/Chest: Effort normal and breath sounds normal. No respiratory distress. He has no wheezes. He has no rales. He exhibits no tenderness.  Abdominal: Soft. Bowel sounds are normal. He exhibits no distension. There is no tenderness.  Musculoskeletal: Normal range of motion. He exhibits no edema and no tenderness.  Lymphadenopathy:    He has no cervical adenopathy.  Neurological: He is alert and oriented to person, place, and time. Coordination  normal.  Skin: Skin is warm and dry. No rash noted. No erythema.  Psychiatric: He has a normal mood and affect. His behavior is normal. Judgment and thought content normal.      Assessment and Plan

## 2012-10-07 DIAGNOSIS — M19022 Primary osteoarthritis, left elbow: Secondary | ICD-10-CM | POA: Insufficient documentation

## 2012-11-03 ENCOUNTER — Other Ambulatory Visit: Payer: Self-pay

## 2012-11-03 MED ORDER — METOPROLOL SUCCINATE ER 25 MG PO TB24: 12.5000 mg | ORAL_TABLET | Freq: Every day | ORAL | Status: DC

## 2012-11-03 NOTE — Telephone Encounter (Signed)
Refilled Metoprolol sent to [facility].

## 2013-02-25 ENCOUNTER — Telehealth: Payer: Self-pay

## 2013-02-25 NOTE — Telephone Encounter (Signed)
Faxed cardiac clearance for surgery to [facility].

## 2013-03-14 ENCOUNTER — Telehealth: Payer: Self-pay | Admitting: Podiatry

## 2013-03-14 NOTE — Telephone Encounter (Signed)
TOLD PT TO STOP TAKING ASPIRIN. PT STATES HE HAS STOPPED THE ASPIRIN ON Thursday 10.2.14

## 2013-03-14 NOTE — Telephone Encounter (Signed)
PT CALLED WANTING TO KNOW WHEN TO STOP TAKING ASPIRIN. HAVING SURGERY 10.10.14

## 2013-03-17 ENCOUNTER — Other Ambulatory Visit: Payer: Self-pay | Admitting: Podiatry

## 2013-03-17 MED ORDER — PROMETHAZINE HCL 25 MG PO TABS: 25.0000 mg | ORAL_TABLET | Freq: Three times a day (TID) | ORAL | Status: DC | PRN

## 2013-03-17 MED ORDER — OXYCODONE-ACETAMINOPHEN 10-325 MG PO TABS: ORAL_TABLET | ORAL | Status: DC

## 2013-03-17 MED ORDER — CEPHALEXIN 500 MG PO CAPS: 500.0000 mg | ORAL_CAPSULE | Freq: Three times a day (TID) | ORAL | Status: DC

## 2013-03-18 ENCOUNTER — Encounter: Payer: Self-pay | Admitting: Podiatry

## 2013-03-18 DIAGNOSIS — M2020 Hallux rigidus, unspecified foot: Secondary | ICD-10-CM

## 2013-03-18 DIAGNOSIS — Z472 Encounter for removal of internal fixation device: Secondary | ICD-10-CM

## 2013-03-21 ENCOUNTER — Encounter: Payer: Self-pay | Admitting: *Deleted

## 2013-03-21 ENCOUNTER — Telehealth: Payer: Self-pay | Admitting: *Deleted

## 2013-03-21 NOTE — Telephone Encounter (Signed)
CALL PATIENT TO SEE HOW HE WAS DOING AFTER SURGERY

## 2013-03-22 ENCOUNTER — Encounter: Payer: Medicare Other | Admitting: Internal Medicine

## 2013-03-23 ENCOUNTER — Ambulatory Visit (INDEPENDENT_AMBULATORY_CARE_PROVIDER_SITE_OTHER): Payer: Medicare Other

## 2013-03-23 ENCOUNTER — Encounter: Payer: Self-pay | Admitting: Podiatry

## 2013-03-23 ENCOUNTER — Ambulatory Visit (INDEPENDENT_AMBULATORY_CARE_PROVIDER_SITE_OTHER): Payer: Medicare Other | Admitting: Podiatry

## 2013-03-23 VITALS — BP 92/67 | HR 72 | Temp 99.0°F | Resp 16 | Wt 208.0 lb

## 2013-03-23 DIAGNOSIS — M2021 Hallux rigidus, right foot: Secondary | ICD-10-CM

## 2013-03-23 DIAGNOSIS — M2020 Hallux rigidus, unspecified foot: Secondary | ICD-10-CM

## 2013-03-23 MED ORDER — OXYCODONE-ACETAMINOPHEN 10-325 MG PO TABS: 1.0000 | ORAL_TABLET | Freq: Four times a day (QID) | ORAL | Status: DC | PRN

## 2013-03-23 NOTE — Progress Notes (Signed)
Geramy presents today 6 days status post Lorenz Coaster arthroplasty with a single silicone implant right foot. We also removed screws from the first metatarsophalangeal joint right foot. He denies fever chills nausea and vomiting but he does relate considerable amount of pain to the first metatarsophalangeal joint.  Objective: Vital signs are stable he is alert and oriented x3. Dry sterile dressing was intact right foot with Cam Walker. Once removed reveals erythema and edema to the first metatarsophalangeal joint of the right foot. Does not demonstrate cellulitis. Radiographic evaluation does demonstrate Keller arthroplasty with a single silicone implant right. Margins are well coapted sutures are intact.  Assessment: Well-healing surgical foot right.  Plan: Redressed the right foot today dry sterile compressive dressing. Continue use of the Cam Walker any time is ambulating. Otherwise he can take off the boot for his range of motion exercises. I will followup with him in one week.

## 2013-03-24 ENCOUNTER — Encounter: Payer: Self-pay | Admitting: Podiatry

## 2013-03-30 ENCOUNTER — Encounter: Payer: Self-pay | Admitting: Podiatry

## 2013-03-30 ENCOUNTER — Ambulatory Visit (INDEPENDENT_AMBULATORY_CARE_PROVIDER_SITE_OTHER): Payer: Medicare Other | Admitting: Podiatry

## 2013-03-30 VITALS — BP 138/79 | HR 79 | Resp 16 | Ht 70.0 in | Wt 210.0 lb

## 2013-03-30 DIAGNOSIS — M2020 Hallux rigidus, unspecified foot: Secondary | ICD-10-CM

## 2013-03-30 DIAGNOSIS — M2021 Hallux rigidus, right foot: Secondary | ICD-10-CM

## 2013-03-30 MED ORDER — CEPHALEXIN 500 MG PO CAPS: 500.0000 mg | ORAL_CAPSULE | Freq: Three times a day (TID) | ORAL | Status: DC

## 2013-03-30 NOTE — Progress Notes (Signed)
Randall Ali presents today for his second postop visit status post Lorenz Coaster arthroplasty single silicone implant right foot. He states that it's somewhat tender across the ball of the foot. At the time moves fairly well he says. He denies fever chills nausea vomiting muscle aches and pains.  Objective: Vital signs are stable he is alert and oriented x3. Pulses are palpable. No calf pain. He has good range of motion of the first metatarsophalangeal joint right foot. Moderate edema with mild erythema mild dehiscence of the wound the sutures are still intact.  Assessment: Status post 2 weeks Keller arthroplasty single silicone implant right. Mild dehiscence of the wound. Currently not complicated.  Plan: Discussed etiology pathology conservative versus surgical therapies. At this point I rewrote his antibiotics for another 10 days. He is to keep the dressing on which we placed a day for the next week. I will followup with him in one week.

## 2013-04-04 ENCOUNTER — Telehealth: Payer: Self-pay | Admitting: *Deleted

## 2013-04-04 NOTE — Telephone Encounter (Signed)
i think i have a infection , swelled up , and real real red , it hurts , it was swelled real bad on Saturday that it looked black , it is back to red today and swollen . Dailyn spoke to    Dr Irving Shows on Saturday and she  called in a new prescription  for him. Is there anything else that needs to be done?

## 2013-04-05 ENCOUNTER — Encounter: Payer: Self-pay | Admitting: Internal Medicine

## 2013-04-05 ENCOUNTER — Ambulatory Visit (INDEPENDENT_AMBULATORY_CARE_PROVIDER_SITE_OTHER): Payer: Medicare Other | Admitting: Internal Medicine

## 2013-04-05 VITALS — BP 114/72 | HR 83 | Temp 98.5°F | Resp 14 | Ht 69.75 in | Wt 212.8 lb

## 2013-04-05 DIAGNOSIS — Z0000 Encounter for general adult medical examination without abnormal findings: Secondary | ICD-10-CM

## 2013-04-05 DIAGNOSIS — Z23 Encounter for immunization: Secondary | ICD-10-CM

## 2013-04-05 DIAGNOSIS — Z125 Encounter for screening for malignant neoplasm of prostate: Secondary | ICD-10-CM

## 2013-04-05 DIAGNOSIS — E785 Hyperlipidemia, unspecified: Secondary | ICD-10-CM

## 2013-04-05 DIAGNOSIS — I10 Essential (primary) hypertension: Secondary | ICD-10-CM

## 2013-04-05 MED ORDER — DICLOFENAC SODIUM 75 MG PO TBEC: 75.0000 mg | DELAYED_RELEASE_TABLET | Freq: Two times a day (BID) | ORAL | Status: DC

## 2013-04-05 NOTE — Assessment & Plan Note (Addendum)
digital rectal exam was done today.  Last year he was sent to [facility] for low testosterone level and ended up having a prostate biopsy which was normal.  Lab Results  Component Value Date   PSA 0.36 04/05/2013   PSA 0.26 02/27/2012

## 2013-04-05 NOTE — Progress Notes (Signed)
Patient ID: Randall Ali, male   DOB: 10/15/1940, 72 y.o.   MRN: [id number]   The patient is here for annual Medicare wellness examination and management of other chronic and acute problems. He was last seen sept 2013.  Since his last visit he undewent surgical correction of great toe with Keller arthroplasty with single silicone implant by [facility] 2 weeks ago.  Had large bone spur secondary to trauma in the 1970s  ,  hyatt fixed the joint by creating a joint out of what hads been fused in the past surgically .  Post op course complicated by possible infection,  Was treated empirically with levaquin for redness and edema. Still having a lot of pain ,  Using oxycodone for pain control, but he is walking a lot on it.  The pain is keeping him up at night   Last eye exam noted cataracts 2013  [facility]  in [place],   The risk factors are reflected in the social history.  The roster of all physicians providing medical care to patient - is listed in the Snapshot section of the chart.  Activities of daily living:  The patient is 100% independent in all ADLs: dressing, toileting, feeding as well as independent mobility  Home safety : The patient has smoke detectors in the home. They wear seatbelts.  There are no firearms at home. There is no violence in the home.   There is no risks for hepatitis, STDs or HIV. There is no   history of blood transfusion. They have no travel history to infectious disease endemic areas of the world.  The patient has seen their dentist in the last six month. They have seen their eye doctor in the last year. They admit to slight hearing difficulty with regard to whispered voices and some television programs.  They have deferred audiologic testing in the last year.  They do not  have excessive sun exposure. Discussed the need for sun protection: hats, long sleeves and use of sunscreen if there is significant sun exposure.   Diet: the importance of a healthy diet is  discussed. They do have a healthy diet.  The benefits of regular aerobic exercise were discussed. She walks 4 times per week ,  20 minutes.   Depression screen: there are no signs or vegative symptoms of depression- irritability, change in appetite, anhedonia, sadness/tearfullness.  Cognitive assessment: the patient manages all their financial and personal affairs and is actively engaged. They could relate day,date,year and events; recalled 2/3 objects at 3 minutes; performed clock-face test normally.  The following portions of the patient's history were reviewed and updated as appropriate: allergies, current medications, past family history, past medical history,  past surgical history, past social history  and problem list.  Visual acuity was not assessed per patient preference since she has regular follow up with her ophthalmologist. Hearing and body mass index were assessed and reviewed.   During the course of the visit the patient was educated and counseled about appropriate screening and preventive services including : fall prevention , diabetes screening, nutrition counseling, colorectal cancer screening, and recommended immunizations.    Objectives:  BP 114/72  Pulse 83  Temp(Src) 98.5 F (36.9 C) (Oral)  Resp 14  Ht 5' 9.75" (1.772 m)  Wt 212 lb 12 oz (96.503 kg)  BMI 30.73 kg/m2  SpO2 97%  General Appearance:    Alert, cooperative, no distress, appears stated age  Head:    Normocephalic, without obvious abnormality, atraumatic  Eyes:    PERRL, conjunctiva/corneas clear, EOM's intact, fundi    benign, both eyes       Ears:    Normal TM's and external ear canals, both ears  Nose:   Nares normal, septum midline, mucosa normal, no drainage   or sinus tenderness  Throat:   Lips, mucosa, and tongue normal; teeth and gums normal  Neck:   Supple, symmetrical, trachea midline, no adenopathy;       thyroid:  No enlargement/tenderness/nodules; no carotid   bruit or JVD  Back:      Symmetric, no curvature, ROM normal, no CVA tenderness  Lungs:     Clear to auscultation bilaterally, respirations unlabored  Chest wall:    No tenderness or deformity  Heart:    Regular rate and rhythm, S1 and S2 normal, no murmur, rub   or gallop  Abdomen:     Soft, non-tender, bowel sounds active all four quadrants,    no masses, no organomegaly  Genitalia:    Normal male without lesion, discharge or tenderness  Rectal:    Normal tone, normal prostate, no masses or tenderness;   guaiac negative stool  Extremities:   Right foot wrapped in surgical dressing, in CAM boot. Extremities normal, atraumatic, no cyanosis or edema  Pulses:   2+ and symmetric all extremities  Skin:   Skin color, texture, turgor normal, no rashes or lesions  Lymph nodes:   Cervical, supraclavicular, and axillary nodes normal  Neurologic:   CNII-XII intact. Normal strength, sensation and reflexes      throughout   Assessment and Plan: Screening for prostate cancer  digital rectal exam was done today.  Last year he was sent to [facility] for low testosterone level and ended up having a prostate biopsy which was normal.  Lab Results  Component Value Date   PSA 0.36 04/05/2013   PSA 0.26 02/27/2012     HLD (hyperlipidemia) Overdue for labs  Routine general medical examination at a health care facility Annual male exam was done including testicular and prostate exam. PSA is normal .  Colon ca screening was done in 2010, options given.    HYPERLIPIDEMIA-MIXED His LDL is 78 (direct)  AND LAST YEAR his  triglycerides were under 150. Repeat lipids have been ordered.   Lab Results  Component Value Date   CHOL 143 02/19/2012   HDL 49.00 02/19/2012   LDLCALC 78 02/19/2012   LDLDIRECT 78.0 04/05/2013   TRIG 82.0 02/19/2012   CHOLHDL 3 02/19/2012    HTN (hypertension) Well controlled on current regimen. Renal function stable, no changes today.  Lab Results  Component Value Date   CREATININE 1.3  04/05/2013    Updated Medication List Outpatient Encounter Prescriptions as of 04/05/2013  Medication Sig Dispense Refill  . aspirin 81 MG EC tablet Take 81 mg by mouth daily.        . Calcium-Vitamin D (RA CALCIUM PLUS VITAMIN D) 600-125 MG-UNIT TABS Take 1 tablet by mouth daily.        . Cholecalciferol (VITAMIN D3) 400 UNITS CAPS Take 1 capsule by mouth daily.        . metoprolol succinate (TOPROL-XL) 25 MG 24 hr tablet Take 0.5 tablets (12.5 mg total) by mouth daily.  45 tablet  3  . simvastatin (ZOCOR) 20 MG tablet Take 1 tablet (20 mg total) by mouth daily.  90 tablet  3  . tadalafil (CIALIS) 20 MG tablet Take 1 tablet (20 mg total) by mouth daily  as needed for erectile dysfunction.  10 tablet  0  . vitamin B-12 (CYANOCOBALAMIN) 1000 MCG tablet Take 1,000 mcg by mouth daily.        . Zinc 100 MG TABS Take 1 tablet by mouth daily.        . diclofenac (VOLTAREN) 75 MG EC tablet Take 1 tablet (75 mg total) by mouth 2 (two) times daily.  60 tablet  1  . nitroGLYCERIN (NITROSTAT) 0.4 MG SL tablet Place 1 tablet (0.4 mg total) under the tongue every 5 (five) minutes as needed for chest pain.  25 tablet  11  . zolpidem (AMBIEN) 10 MG tablet Take 1 tablet (10 mg total) by mouth at bedtime as needed for sleep.  15 tablet  1  . [DISCONTINUED] cephALEXin (KEFLEX) 500 MG capsule Take 1 capsule (500 mg total) by mouth 3 (three) times daily.  30 capsule  0  . [DISCONTINUED] cephALEXin (KEFLEX) 500 MG capsule Take 1 capsule (500 mg total) by mouth 3 (three) times daily.  30 capsule  1  . [DISCONTINUED] oxyCODONE-acetaminophen (PERCOCET) 10-325 MG per tablet Take one to two tablets every 6-8 hours for pain.  50 tablet  0  . [DISCONTINUED] oxyCODONE-acetaminophen (PERCOCET) 10-325 MG per tablet Take 1 tablet by mouth every 6 (six) hours as needed for pain.  50 tablet  0  . [DISCONTINUED] promethazine (PHENERGAN) 25 MG tablet Take 1 tablet (25 mg total) by mouth every 8 (eight) hours as needed for nausea.   30 tablet  0   No facility-administered encounter medications on file as of 04/05/2013.

## 2013-04-05 NOTE — Patient Instructions (Addendum)
I am sending an anti inflammatory toyour  pharamacy to use for the inflammation in your  Healing toe. It is called diclofenac and can be taken twice daily,  It replaces motrin but it can be combined with oxycodone.  Your oxycodone may cause constipation.  It this happens try dulcolax 5 or 10 mg at bedtime.  Your next colonoscopy is due July 2015  You need to have a TDaP vaccine I have given you prescriptions for this because it will be less expensive  at the health Dept or at your local pharmacy because Medicare will not reimburse it   You received the high dose flu vaccine  today.  We will contact you with the bloodwork results

## 2013-04-05 NOTE — Assessment & Plan Note (Signed)
Overdue for labs

## 2013-04-06 ENCOUNTER — Ambulatory Visit (INDEPENDENT_AMBULATORY_CARE_PROVIDER_SITE_OTHER): Payer: Medicare Other | Admitting: Podiatry

## 2013-04-06 ENCOUNTER — Encounter: Payer: Self-pay | Admitting: Podiatry

## 2013-04-06 VITALS — BP 117/67 | HR 72 | Resp 16 | Ht 69.0 in | Wt 212.0 lb

## 2013-04-06 DIAGNOSIS — M205X9 Other deformities of toe(s) (acquired), unspecified foot: Secondary | ICD-10-CM

## 2013-04-06 DIAGNOSIS — M205X1 Other deformities of toe(s) (acquired), right foot: Secondary | ICD-10-CM

## 2013-04-06 LAB — CBC WITH DIFFERENTIAL/PLATELET
Basophils Absolute: 0 10*3/uL (ref 0.0–0.1)
Basophils Relative: 0.1 % (ref 0.0–3.0)
Eosinophils Absolute: 0.1 10*3/uL (ref 0.0–0.7)
Eosinophils Relative: 1.1 % (ref 0.0–5.0)
HCT: 45.1 % (ref 39.0–52.0)
Hemoglobin: 15.2 g/dL (ref 13.0–17.0)
Lymphocytes Relative: 13.5 % (ref 12.0–46.0)
Lymphs Abs: 1.8 10*3/uL (ref 0.7–4.0)
MCHC: 33.7 g/dL (ref 30.0–36.0)
MCV: 90.4 fl (ref 78.0–100.0)
Monocytes Absolute: 1.9 10*3/uL — ABNORMAL HIGH (ref 0.1–1.0)
Monocytes Relative: 14.7 % — ABNORMAL HIGH (ref 3.0–12.0)
Neutro Abs: 9.3 10*3/uL — ABNORMAL HIGH (ref 1.4–7.7)
Neutrophils Relative %: 70.6 % (ref 43.0–77.0)
Platelets: 262 10*3/uL (ref 150.0–400.0)
RBC: 4.99 Mil/uL (ref 4.22–5.81)
RDW: 12.9 % (ref 11.5–14.6)
WBC: 13.2 10*3/uL — ABNORMAL HIGH (ref 4.5–10.5)

## 2013-04-06 LAB — LDL CHOLESTEROL, DIRECT: Direct LDL: 78 mg/dL

## 2013-04-06 LAB — COMPREHENSIVE METABOLIC PANEL
ALT: 17 U/L (ref 0–53)
AST: 13 U/L (ref 0–37)
Albumin: 4 g/dL (ref 3.5–5.2)
Alkaline Phosphatase: 66 U/L (ref 39–117)
BUN: 23 mg/dL (ref 6–23)
CO2: 23 mEq/L (ref 19–32)
Calcium: 9.6 mg/dL (ref 8.4–10.5)
Chloride: 104 mEq/L (ref 96–112)
Creatinine, Ser: 1.3 mg/dL (ref 0.4–1.5)
GFR: 58.87 mL/min — ABNORMAL LOW (ref >60.00)
Glucose, Bld: 99 mg/dL (ref 70–99)
Potassium: 4.2 mEq/L (ref 3.5–5.1)
Sodium: 137 mEq/L (ref 135–145)
Total Bilirubin: 0.8 mg/dL (ref 0.3–1.2)
Total Protein: 7 g/dL (ref 6.0–8.3)

## 2013-04-06 LAB — TSH: TSH: 1.46 u[IU]/mL (ref 0.35–5.50)

## 2013-04-06 LAB — VITAMIN D 25 HYDROXY (VIT D DEFICIENCY, FRACTURES): Vit D, 25-Hydroxy: 66 ng/mL (ref 30–89)

## 2013-04-06 LAB — PSA, MEDICARE: PSA: 0.36 ng/ml (ref 0.10–4.00)

## 2013-04-06 NOTE — Assessment & Plan Note (Signed)
Annual male exam was done including testicular and prostate exam. PSA is normal .  Colon ca screening was done in 2010, options given.

## 2013-04-06 NOTE — Assessment & Plan Note (Addendum)
His LDL is 78 (direct)  AND LAST YEAR his  triglycerides were under 150. Repeat lipids have been ordered.   Lab Results  Component Value Date   CHOL 143 02/19/2012   HDL 49.00 02/19/2012   LDLCALC 78 02/19/2012   LDLDIRECT 78.0 04/05/2013   TRIG 82.0 02/19/2012   CHOLHDL 3 02/19/2012

## 2013-04-06 NOTE — Progress Notes (Signed)
Chason presents today for a followup of his surgical foot right. 3 weeks ago we performed a Keller arthroplasty with single silicone implant. Been doing quite well with that until as of late has swollen up become red and painful and is beginning to have pain across the ball of the foot plantarly. He called last week and Dr. Donzetta Kohut put him on Levaquin 750 mg 1 by mouth daily. He states it seems to be doing much better. He does relate that he thinks he's been on it too much. He denies fever chills nausea vomiting muscle aches and pains and calf tenderness.  Objective: Vital signs are stable he is alert and oriented x3. Right foot demonstrates moderate edema to the forefoot at the site of surgery. Steri-Strips are in place and there is a mild dehiscence. Has a great range of motion to the first metatarsophalangeal joint and there is some skin breakdown as a result of bolus formation. There is positive edema , mild erythema no cellulitis drainage or odor.  Assessment: Well-healing surgical foot mild setback with this superficial infection.  Plan: Continue conservative therapies soak with Epsom salts and water off followup with him in one week.

## 2013-04-06 NOTE — Assessment & Plan Note (Signed)
Well controlled on current regimen. Renal function stable, no changes today.  Lab Results  Component Value Date   CREATININE 1.3 04/05/2013

## 2013-04-13 ENCOUNTER — Encounter: Payer: Self-pay | Admitting: Podiatry

## 2013-04-13 ENCOUNTER — Ambulatory Visit (INDEPENDENT_AMBULATORY_CARE_PROVIDER_SITE_OTHER): Payer: Medicare Other

## 2013-04-13 ENCOUNTER — Ambulatory Visit (INDEPENDENT_AMBULATORY_CARE_PROVIDER_SITE_OTHER): Payer: Medicare Other | Admitting: Podiatry

## 2013-04-13 VITALS — BP 122/70 | HR 68 | Resp 16 | Ht 66.0 in | Wt 310.0 lb

## 2013-04-13 DIAGNOSIS — Z9889 Other specified postprocedural states: Secondary | ICD-10-CM

## 2013-04-13 DIAGNOSIS — L02619 Cutaneous abscess of unspecified foot: Secondary | ICD-10-CM

## 2013-04-13 DIAGNOSIS — L03119 Cellulitis of unspecified part of limb: Secondary | ICD-10-CM

## 2013-04-13 MED ORDER — INDOMETHACIN 50 MG PO CAPS: 50.0000 mg | ORAL_CAPSULE | Freq: Two times a day (BID) | ORAL | Status: DC

## 2013-04-13 NOTE — Progress Notes (Signed)
Randall Ali presents today for followup of his Randall Ali arthroplasty single silicone implant first metatarsophalangeal joint of the right foot. He states it seems to be doing some better.  Objective: The wound does appear to be healing. I am concerned about the erythema that maintains its presence and his toe. Radiographs does demonstrate some early changes to the toe possibility of the ostial lysis of some sort. Otherwise no signs of infection.  Assessment: Slowly healing wound right foot. With probable inflammation.  Plan: He will continue his antibiotics for previously written for him I also wrote her prescription for indomethacin to help alleviate the inflammation in the toe. Followup with him in one week.

## 2013-04-15 ENCOUNTER — Telehealth: Payer: Self-pay | Admitting: *Deleted

## 2013-04-15 NOTE — Telephone Encounter (Signed)
Patient calling for lab results from 04/05/13 please advise?

## 2013-04-16 NOTE — Telephone Encounter (Signed)
All of his labs including PSA , thyroid cholesterol, liver kidney and Vitamin D were normal.  White count was mildly elevated which is likely related to his foot surgery .  I noticed that Dr Al Corpus placed him on indomethacin for the inflammation .  He should make sure he follows up with him as directed because if the inflammation doesn't resolve he may have some infection in the toe.

## 2013-04-18 NOTE — Telephone Encounter (Signed)
FYI patient following Dr. Al Corpus every weekly. Patient notified of results.

## 2013-04-20 ENCOUNTER — Encounter: Payer: Self-pay | Admitting: Podiatry

## 2013-04-20 ENCOUNTER — Ambulatory Visit (INDEPENDENT_AMBULATORY_CARE_PROVIDER_SITE_OTHER): Payer: Medicare Other | Admitting: Podiatry

## 2013-04-20 VITALS — BP 118/62 | HR 62 | Resp 18 | Ht 69.0 in | Wt 215.0 lb

## 2013-04-20 DIAGNOSIS — Z9889 Other specified postprocedural states: Secondary | ICD-10-CM

## 2013-04-20 NOTE — Progress Notes (Signed)
Randall Ali presents today for followup of his fusion reversal with [facility] arthroplasty single silicone implant. Until this point he has been swollen and erythematous. The edema appears to be decreasing he states it is really not symptomatic. He was more concerned about the wound not healing.  Objective: Vital signs are stable he is alert and oriented x3. His pulses are palpable to the right lower extremity. He does have a hallux malleus is present. But the wound is healing. Much decrease in edema and erythema.  Assessment: Slowly healing Keller arthroplasty right.  Plan: Continue the use of his antibiotics until finished continue the use of indomethacin. Followup with him in 2 weeks.

## 2013-04-27 ENCOUNTER — Other Ambulatory Visit: Payer: Self-pay | Admitting: Internal Medicine

## 2013-04-27 NOTE — Telephone Encounter (Signed)
Okay to refill? 

## 2013-05-04 ENCOUNTER — Encounter: Payer: Self-pay | Admitting: Podiatry

## 2013-05-04 ENCOUNTER — Ambulatory Visit (INDEPENDENT_AMBULATORY_CARE_PROVIDER_SITE_OTHER): Payer: Medicare Other

## 2013-05-04 ENCOUNTER — Ambulatory Visit (INDEPENDENT_AMBULATORY_CARE_PROVIDER_SITE_OTHER): Payer: Medicare Other | Admitting: Podiatry

## 2013-05-04 VITALS — BP 148/87 | HR 63 | Resp 16 | Ht 69.0 in | Wt 215.0 lb

## 2013-05-04 DIAGNOSIS — Z9889 Other specified postprocedural states: Secondary | ICD-10-CM

## 2013-05-04 MED ORDER — TRAMADOL HCL 50 MG PO TABS: 50.0000 mg | ORAL_TABLET | Freq: Three times a day (TID) | ORAL | Status: DC | PRN

## 2013-05-04 MED ORDER — METHYLPREDNISOLONE (PAK) 4 MG PO TABS: ORAL_TABLET | ORAL | Status: DC

## 2013-05-04 NOTE — Progress Notes (Signed)
Dyar presents today for followup of his first metatarsophalangeal joint implant right foot he states it is not doing good. He states is staying swollen but it is not painful with ambulation. He does present in a Darco shoe today. 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  Objective: Vital signs are stable he is alert and oriented x3. He does not appear to be any undue distress. Pulses are palpable right lower extremity. Has mild to moderate edema first metatarsophalangeal joint with mild erythema overlying the toe itself radiographic evaluation does not demonstrate any type of osseous abnormalities as a matter fact the bone looks much more stable than it did previous x-rays.  Assessment: At this point secondary to all of our other conservative therapies having failed including nonsteroidals and antibiotics I feel that blood tests consisting of a and arthritic panel is necessary. I will also cream on a Sterapred Dosepak to see to get the edema down.  Plan blood work and cortisone. We will followup with him in one month. I will call him sooner if necessary.

## 2013-05-10 ENCOUNTER — Encounter: Payer: Self-pay | Admitting: Podiatry

## 2013-05-11 ENCOUNTER — Ambulatory Visit (INDEPENDENT_AMBULATORY_CARE_PROVIDER_SITE_OTHER): Payer: Medicare Other | Admitting: Podiatry

## 2013-05-11 ENCOUNTER — Encounter: Payer: Self-pay | Admitting: Podiatry

## 2013-05-11 VITALS — BP 160/139 | HR 69 | Resp 16 | Ht 70.0 in | Wt 210.0 lb

## 2013-05-11 DIAGNOSIS — Z9889 Other specified postprocedural states: Secondary | ICD-10-CM

## 2013-05-11 MED ORDER — DICLOFENAC SODIUM 1 % TD GEL: 4.0000 g | Freq: Four times a day (QID) | TRANSDERMAL | Status: DC

## 2013-05-11 NOTE — Progress Notes (Signed)
Randall Ali presents today still complaining of pain beneath the lesser metatarsophalangeal joints of the right foot. He is status post a Keller arthroplasty single silicone implant. He has recently discontinued the use of the Darco shoe and is utilizing tissues at this point. He states he has some tenderness right in here as he points to the medial aspect of the first metatarsophalangeal joint. He relates some tenderness on range of motion of the first metatarsophalangeal joint.  Objective: Vital signs are stable he is alert and oriented x3 mild erythema and edema overlying the first metatarsophalangeal joint of the right foot. Currently I see no signs of infection. He does have pain on palpation of the lesser metatarsophalangeal joints. I believe this to be a compensation event.  Assessment: Pain in right foot with capsulitis and metatarsalgia status post Keller arthroplasty right.  Plan: Wrote a prescription for Voltaren gel to be applied times daily. We will consider physical therapy and orthotics next visit.

## 2013-05-17 ENCOUNTER — Encounter: Payer: Self-pay | Admitting: Internal Medicine

## 2013-05-17 ENCOUNTER — Ambulatory Visit (INDEPENDENT_AMBULATORY_CARE_PROVIDER_SITE_OTHER): Payer: Medicare Other | Admitting: Internal Medicine

## 2013-05-17 ENCOUNTER — Encounter (INDEPENDENT_AMBULATORY_CARE_PROVIDER_SITE_OTHER): Payer: Self-pay

## 2013-05-17 VITALS — BP 134/68 | HR 64 | Temp 97.3°F | Resp 16 | Ht 70.0 in | Wt 213.5 lb

## 2013-05-17 DIAGNOSIS — Z9889 Other specified postprocedural states: Secondary | ICD-10-CM

## 2013-05-17 DIAGNOSIS — J069 Acute upper respiratory infection, unspecified: Secondary | ICD-10-CM

## 2013-05-17 DIAGNOSIS — R911 Solitary pulmonary nodule: Secondary | ICD-10-CM

## 2013-05-17 DIAGNOSIS — M6208 Separation of muscle (nontraumatic), other site: Secondary | ICD-10-CM

## 2013-05-17 DIAGNOSIS — Z9660 Presence of unspecified orthopedic joint implant: Secondary | ICD-10-CM

## 2013-05-17 DIAGNOSIS — L089 Local infection of the skin and subcutaneous tissue, unspecified: Secondary | ICD-10-CM

## 2013-05-17 DIAGNOSIS — M6200 Separation of muscle (nontraumatic), unspecified site: Secondary | ICD-10-CM

## 2013-05-17 NOTE — Patient Instructions (Addendum)
You have a viral  Syndrome .  The post nasal drip is causing your sore throat.  Flush  your sinuses twice daily with Simply saline  Or Neil's Sinus rinse nasal spray.  Use benadryl 25 mg every 8 hours and Sudafed PE 10 to 30 mg  every 8 hours to manage the drainage and congestion.  Gargle with salt water often for the sore throat.  Your abdominal swelling is due to diastus recti (nothing to worry about);  Stop doing so many  Sit ups!  I agree with you that your toe is not normal.  Bloodwork today to look for signs of infection

## 2013-05-17 NOTE — Assessment & Plan Note (Addendum)
He is reqeusting to repeat imaging on pulmonary nodule (requested during acute visit for sinusitis, diastis recti and toe piain).  Will schedule him for CT and follow up with Dr. Kendrick Fries after CT is ordered

## 2013-05-17 NOTE — Progress Notes (Signed)
Patient ID: Randall Ali, male   DOB: May 18, 1941, 72 y.o.   MRN: [id number]   Patient Active Problem List   Diagnosis Date Noted  . Acute upper respiratory infections of unspecified site 05/19/2013  . Diastasis recti 05/19/2013  . Inflammation of toe 05/19/2013  . Screening for prostate cancer 02/27/2012  . Routine general medical examination at a health care facility 02/27/2012  . Erectile dysfunction 02/27/2012  . HLD (hyperlipidemia)   . HTN (hypertension)   . Dysphagia 12/24/2011  . Pulmonary nodule 12/24/2011  . HYPERLIPIDEMIA-MIXED 03/12/2010  . CHEST PAIN-UNSPECIFIED 03/12/2010  . OLD MYOCARDIAL INFARCTION 03/08/2009  . CAD, NATIVE VESSEL 03/08/2009    Subjective:  CC:   Chief Complaint  Patient presents with  . Acute Visit    Knots in stomach while performing situps  . Sinusitis    HPI:   Randall Ali a 72 y.o. male who presents  With headache for the last 4 or 5 days, ethmoid region,  No sinus congestion  Or drainage. Has been sneezing more than usual.   Midline abdominal swelling more pronounced when he is doing sit-ups. The area is nontender. Since he has injured his foot he has been unable to do his regular exercise routine so he has been doing an hour and a half a sit-ups every day.   Left great toe  Still painful to walk on, since his surgery in October. The toe remains red and swollen. He has had regular followup with Randall Ali after having undergone an implant in the great metatarsal in October.  Patient still having pain on the ball of foot  Severe when walkingn,  The toe is still painful. States that the toe is still red even when he has slept overnight   Past Medical History  Diagnosis Date  . CAD (coronary artery disease)     s/p inferior wall infarct in 10/01. has stent in Rt coronary artery. is due a stress myoview. does have some dyspnea on exertion  . SOB (shortness of breath) on exertion   . 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  Chest pain   . HLD (hyperlipidemia)     isdue followup lipids. on zocor 10 mg/day   . HTN (hypertension)     Past Surgical History  Procedure Laterality Date  . Cardiac catheterization  06/24/11  . Carotid stent  03/10/2011  . Arm surgery  2010  . Shoulder arthroscopy  2012  . Wrist surgery  2011  . Toe surgery  1994  . Nasal sinus surgery  2008    septpolasty, bilateral turbinate reduction       The following portions of the patient's history were reviewed and updated as appropriate: Allergies, current medications, and problem list.    Review of Systems:   Patient denies headache, fevers, malaise, unintentional weight loss, skin rash, eye pain, sinus congestion and sinus pain, sore throat, dysphagia,  hemoptysis , cough, dyspnea, wheezing, chest pain, palpitations, orthopnea, edema, abdominal pain, nausea, melena, diarrhea, constipation, flank pain, dysuria, hematuria, urinary  Frequency, nocturia, numbness, tingling, seizures,  Focal weakness, Loss of consciousness,  Tremor, insomnia, depression, anxiety, and suicidal ideation.     History   Social History  . Marital Status: Single    Spouse Name: N/A    Number of Children: N/A  . Years of Education: 12   Occupational History  . retired     [employer]   Social History Main Topics  . Smoking status: Former  Smoker -- 2.50 packs/day for 40 years    Types: Cigarettes    Quit date: 03/24/2000  . Smokeless tobacco: Never Used  . Alcohol Use: 6.0 oz/week    10 Shots of liquor per week  . Drug Use: No  . Sexual Activity: Not on file   Other Topics Concern  . Not on file   Social History Narrative   Singled; retired, part time; gets regular exercise.    Caffeine Use-yes          Objective:  Filed Vitals:   05/17/13 1446  BP: 134/68  Pulse: 64  Temp: 97.3 F (36.3 C)  Resp: 16     General appearance: alert, cooperative and appears stated age Ears: normal TM's and external ear canals both ears Throat:  lips, mucosa, and tongue normal; teeth and gums normal Neck: no adenopathy, no carotid bruit, supple, symmetrical, trachea midline and thyroid not enlarged, symmetric, no tenderness/mass/nodules Back: symmetric, no curvature. ROM normal. No CVA tenderness. Lungs: clear to auscultation bilaterally Heart: regular rate and rhythm, S1, S2 normal, no murmur, click, rub or gallop Abdomen: soft, non-tender; bowel sounds normal; no masses,  no organomegaly Pulses: 2+ and symmetric Skin: Great toe on  right foot is inflamed swollen and tender to palpation.normal. No rashes or lesions Lymph nodes: Cervical, supraclavicular, and axillary nodes normal.  Assessment and Plan:  Pulmonary nodule He is reqeusting to repeat imaging on pulmonary nodule (requested during acute visit for sinusitis, diastis recti and toe piain).  Will schedule him for CT and follow up with Randall Ali after CT is ordered   Acute upper respiratory infections of unspecified site This is most likely viral given the symptoms he has described. Explained that an antibiotic will not help the symptoms and will increase  his risk of developing diarrhea. .  Ibuprofen 400 mg and tylenol 650 mq 8 hrs for aches and pains,  and decongestants and Benadryl for congestion and drainage. He  Diastasis recti Reassured patient that he does not have a hernia at this time. However recommended that he reduce his daily situps and find an alternate way of exercising and his abdominal muscles  to reduce the chance of injury.  Inflammation of toe He continues to have significant pain and inflammation with redness of the great toe following his metatarsal implant which was done in October by Randall Ali. I agreed to look for other causes of inflammation. As of the time of dictation his  sedimentation rate, C-reactive protein, uric acid level, and CBC, are all normal. Therefore there is no reason to pursue occult infection as originally suspected.    A total of  40 minutes was spent with patient more than half of which was spent in counseling, reviewing records from other prviders and coordination of care.  Updated Medication List Outpatient Encounter Prescriptions as of 05/17/2013  Medication Sig  . aspirin 81 MG EC tablet Take 81 mg by mouth daily.    . Calcium-Vitamin D (RA CALCIUM PLUS VITAMIN D) 600-125 MG-UNIT TABS Take 1 tablet by mouth daily.    . diclofenac sodium (VOLTAREN) 1 % GEL Apply 4 g topically 4 (four) times daily.  . metoprolol succinate (TOPROL-XL) 25 MG 24 hr tablet Take 0.5 tablets (12.5 mg total) by mouth daily.  [name] oxyCODONE-acetaminophen (PERCOCET) 10-325 MG per tablet Take 1 tablet by mouth 4 (four) times daily as needed.  . simvastatin (ZOCOR) 20 MG tablet Take 1 tablet (20 mg total) by mouth daily.  . tadalafil (  CIALIS) 20 MG tablet Take 1 tablet (20 mg total) by mouth daily as needed for erectile dysfunction.  . traMADol (ULTRAM) 50 MG tablet Take 1 tablet (50 mg total) by mouth every 8 (eight) hours as needed.  . vitamin B-12 (CYANOCOBALAMIN) 1000 MCG tablet Take 1,000 mcg by mouth daily.    . Zinc 100 MG TABS Take 1 tablet by mouth daily.    [name] zolpidem (AMBIEN) 10 MG tablet TAKE ONE TABLET AT BEDTIME AS NEEDED FORSLEEP  . Cholecalciferol (VITAMIN D3) 400 UNITS CAPS Take 1 capsule by mouth daily.    . diclofenac (VOLTAREN) 75 MG EC tablet Take 1 tablet (75 mg total) by mouth 2 (two) times daily.  . indomethacin (INDOCIN) 50 MG capsule Take 1 capsule (50 mg total) by mouth 2 (two) times daily with a meal.  . methylPREDNIsolone (MEDROL DOSPACK) 4 MG tablet follow package directions  . nitroGLYCERIN (NITROSTAT) 0.4 MG SL tablet Place 1 tablet (0.4 mg total) under the tongue every 5 (five) minutes as needed for chest pain.     Orders Placed This Encounter  Procedures  . CT Chest Wo Contrast  . Sedimentation rate  . C-reactive protein  . Uric acid  . CBC with Differential    No Follow-up on file.

## 2013-05-18 LAB — CBC WITH DIFFERENTIAL/PLATELET
Basophils Absolute: 0 10*3/uL (ref 0.0–0.1)
Basophils Relative: 0.4 % (ref 0.0–3.0)
Eosinophils Absolute: 0.2 10*3/uL (ref 0.0–0.7)
Eosinophils Relative: 2.9 % (ref 0.0–5.0)
HCT: 44.1 % (ref 39.0–52.0)
Hemoglobin: 14.9 g/dL (ref 13.0–17.0)
Lymphocytes Relative: 29.6 % (ref 12.0–46.0)
Lymphs Abs: 1.9 10*3/uL (ref 0.7–4.0)
MCHC: 33.8 g/dL (ref 30.0–36.0)
MCV: 89.2 fl (ref 78.0–100.0)
Monocytes Absolute: 0.7 10*3/uL (ref 0.1–1.0)
Monocytes Relative: 11 % (ref 3.0–12.0)
Neutro Abs: 3.5 10*3/uL (ref 1.4–7.7)
Neutrophils Relative %: 56.1 % (ref 43.0–77.0)
Platelets: 228 10*3/uL (ref 150.0–400.0)
RBC: 4.95 Mil/uL (ref 4.22–5.81)
RDW: 14 % (ref 11.5–14.6)
WBC: 6.3 10*3/uL (ref 4.5–10.5)

## 2013-05-18 LAB — URIC ACID: Uric Acid, Serum: 4.1 mg/dL (ref 4.0–7.8)

## 2013-05-18 LAB — C-REACTIVE PROTEIN: CRP: 2.5 mg/dL (ref 0.5–20.0)

## 2013-05-18 LAB — SEDIMENTATION RATE: Sed Rate: 16 mm/hr (ref 0–22)

## 2013-05-19 DIAGNOSIS — J069 Acute upper respiratory infection, unspecified: Secondary | ICD-10-CM | POA: Insufficient documentation

## 2013-05-19 DIAGNOSIS — Z9660 Presence of unspecified orthopedic joint implant: Secondary | ICD-10-CM | POA: Insufficient documentation

## 2013-05-19 DIAGNOSIS — M6208 Separation of muscle (nontraumatic), other site: Secondary | ICD-10-CM | POA: Insufficient documentation

## 2013-05-19 DIAGNOSIS — Z9889 Other specified postprocedural states: Secondary | ICD-10-CM | POA: Insufficient documentation

## 2013-05-19 NOTE — Assessment & Plan Note (Signed)
This is most likely viral given the symptoms he has described. Explained that an antibiotic will not help the symptoms and will increase  his risk of developing diarrhea. .  Ibuprofen 400 mg and tylenol 650 mq 8 hrs for aches and pains,  and decongestants and Benadryl for congestion and drainage. He

## 2013-05-19 NOTE — Assessment & Plan Note (Addendum)
He continues to have significant pain and inflammation with redness of the right reat toe following his Keller arthroplasty silicone implant and  which was done in October by Dr. Al Corpus. I checked a sedimentation rate, C-reactive protein, uric acid level, and CBC, all of which were stone cold normal. Therefore there is no reason to pursue occult infection as originally suspected.  Recommended to follow up with Dr. Al Corpus in use of Voltaren gel for metatarsalgia as prescribed by Dr. Al Corpus

## 2013-05-19 NOTE — Assessment & Plan Note (Signed)
Reassured patient that he does not have a hernia at this time. However recommended that he reduce his daily situps and find an alternate way of exercising and his abdominal muscles  to reduce the chance of injury.

## 2013-05-23 ENCOUNTER — Ambulatory Visit: Payer: Self-pay | Admitting: Internal Medicine

## 2013-05-25 ENCOUNTER — Encounter: Payer: Self-pay | Admitting: Podiatry

## 2013-05-25 ENCOUNTER — Ambulatory Visit (INDEPENDENT_AMBULATORY_CARE_PROVIDER_SITE_OTHER): Payer: Medicare Other | Admitting: Podiatry

## 2013-05-25 ENCOUNTER — Ambulatory Visit (INDEPENDENT_AMBULATORY_CARE_PROVIDER_SITE_OTHER): Payer: Medicare Other

## 2013-05-25 VITALS — BP 123/68 | HR 66 | Resp 16

## 2013-05-25 DIAGNOSIS — M79609 Pain in unspecified limb: Secondary | ICD-10-CM

## 2013-05-25 DIAGNOSIS — M7750 Other enthesopathy of unspecified foot: Secondary | ICD-10-CM

## 2013-05-25 DIAGNOSIS — Z9889 Other specified postprocedural states: Secondary | ICD-10-CM

## 2013-05-25 DIAGNOSIS — M778 Other enthesopathies, not elsewhere classified: Secondary | ICD-10-CM

## 2013-05-25 MED ORDER — GABAPENTIN 100 MG PO CAPS: 100.0000 mg | ORAL_CAPSULE | Freq: Every day | ORAL | Status: DC

## 2013-05-25 NOTE — Progress Notes (Signed)
   Subjective:    Patient ID: Randall Ali, male    DOB: 21-Sep-1940, 72 y.o.   MRN: [id number]  HPI Comments: It feels terrible, dos 10.10.14     Review of Systems     Objective:   Physical Exam I have reviewed his past medical history medications allergies. Vital signs are stable he is alert and oriented x3. I have reviewed his recent lab report from his primary care provider. Vascular evaluation right foot demonstrates strong palpable pulses. Right foot demonstrates minimal edema much less erythema no cellulitis drainage or odor. He has good range of motion of the toe which is non-painful he does have tenderness on palpation of the sesamoidal apparatus of the hallux. Radiographs demonstrate nice bone consolidation no signs of osteomyelitis. Labs consisting of a CBC sedimentation rate and uric acid row within normal limits. The majority of his pain is sub-first metatarsophalangeal joint and to a lesser degree his lesser toes bilaterally. He relates some numbness and tingling to his toes bilateral foot. States that both burn after he is been on his feet for long period time.        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 Mild peripheral neuropathy bilateral foot.  Plan: I started him on 100 mg of Neurontin today. He was started taking only one a night. We will increase the dose next visit if necessary as well as the frequency. I also injected the sesamoidal apparatus with cortisone 20 mg and local anesthetic right foot. We'll followup with him in 3-4 weeks

## 2013-05-26 ENCOUNTER — Telehealth: Payer: Self-pay | Admitting: Internal Medicine

## 2013-05-26 DIAGNOSIS — R911 Solitary pulmonary nodule: Secondary | ICD-10-CM

## 2013-05-26 NOTE — Assessment & Plan Note (Signed)
Unchanged by 2 yr follow up ct  Dec 2015

## 2013-05-26 NOTE — Telephone Encounter (Signed)
Pt advised and states an understanding 

## 2013-05-26 NOTE — Telephone Encounter (Signed)
CT of the chest shows a stable unchanged pulmonary nodule and now is considered to be benign since it has not changed in over 2 years.

## 2013-05-31 ENCOUNTER — Encounter: Payer: Self-pay | Admitting: Adult Health

## 2013-05-31 ENCOUNTER — Ambulatory Visit (INDEPENDENT_AMBULATORY_CARE_PROVIDER_SITE_OTHER): Payer: Medicare Other | Admitting: Adult Health

## 2013-05-31 VITALS — BP 134/76 | HR 84 | Temp 98.2°F | Resp 14 | Wt 213.0 lb

## 2013-05-31 DIAGNOSIS — R6889 Other general symptoms and signs: Secondary | ICD-10-CM

## 2013-05-31 DIAGNOSIS — N390 Urinary tract infection, site not specified: Secondary | ICD-10-CM

## 2013-05-31 DIAGNOSIS — J020 Streptococcal pharyngitis: Secondary | ICD-10-CM

## 2013-05-31 DIAGNOSIS — J029 Acute pharyngitis, unspecified: Secondary | ICD-10-CM

## 2013-05-31 LAB — POCT INFLUENZA A/B
Influenza A, POC: NEGATIVE
Influenza B, POC: NEGATIVE

## 2013-05-31 LAB — POCT RAPID STREP A (OFFICE): Rapid Strep A Screen: POSITIVE — AB

## 2013-05-31 MED ORDER — AZITHROMYCIN 250 MG PO TABS: ORAL_TABLET | ORAL | Status: DC

## 2013-05-31 NOTE — Assessment & Plan Note (Signed)
Positive for strep. Start Azithromycin. Gargle with salt water, drink plenty of fluids. May use chloraseptic spray or lozenges. RTC if no improvement within 3-4 days.

## 2013-05-31 NOTE — Patient Instructions (Signed)
Strep Throat  Strep throat is an infection of the throat caused by a bacteria named Streptococcus pyogenes. Your caregiver may call the infection streptococcal "tonsillitis" or "pharyngitis" depending on whether there are signs of inflammation in the tonsils or back of the throat. Strep throat is most common in children aged 71 15 years during the cold months of the year, but it can occur in people of any age during any season. This infection is spread from person to person (contagious) through coughing, sneezing, or other close contact.  SYMPTOMS   · Fever or chills.  · Painful, swollen, red tonsils or throat.  · Pain or difficulty when swallowing.  · White or yellow spots on the tonsils or throat.  · Swollen, tender lymph nodes or "glands" of the neck or under the jaw.  · Red rash all over the body (rare).  DIAGNOSIS   Many different infections can cause the same symptoms. A test must be done to confirm the diagnosis so the right treatment can be given. A "rapid strep test" can help your caregiver make the diagnosis in a few minutes. If this test is not available, a light swab of the infected area can be used for a throat culture test. If a throat culture test is done, results are usually available in a day or two.  TREATMENT   Strep throat is treated with antibiotic medicine.  HOME CARE INSTRUCTIONS   · Gargle with 1 tsp of salt in 1 cup of warm water, 3 4 times per day or as needed for comfort.  · Family members who also have a sore throat or fever should be tested for strep throat and treated with antibiotics if they have the strep infection.  · Make sure everyone in your household washes their hands well.  · Do not share food, drinking cups, or personal items that could cause the infection to spread to others.  · You may need to eat a soft food diet until your sore throat gets better.  · Drink enough water and fluids to keep your urine clear or pale yellow. This will help prevent dehydration.  · Get plenty of  rest.  · Stay home from school, daycare, or work until you have been on antibiotics for 24 hours.  · Only take over-the-counter or prescription medicines for pain, discomfort, or fever as directed by your caregiver.  · If antibiotics are prescribed, take them as directed. Finish them even if you start to feel better.  SEEK MEDICAL CARE IF:   · The glands in your neck continue to enlarge.  · You develop a rash, cough, or earache.  · You cough up green, yellow-brown, or bloody sputum.  · You have pain or discomfort not controlled by medicines.  · Your problems seem to be getting worse rather than better.  SEEK IMMEDIATE MEDICAL CARE IF:   · You develop any new symptoms such as vomiting, severe headache, stiff or painful neck, chest pain, shortness of breath, or trouble swallowing.  · You develop severe throat pain, drooling, or changes in your voice.  · You develop swelling of the neck, or the skin on the neck becomes red and tender.  · You have a fever.  · You develop signs of dehydration, such as fatigue, dry mouth, and decreased urination.  · You become increasingly sleepy, or you cannot wake up completely.  Document Released: 05/23/2000 Document Revised: 05/12/2012 Document Reviewed: 07/25/2010  [employer]® Patient Information ©2014 [employer].

## 2013-05-31 NOTE — Progress Notes (Signed)
Pre visit review using our clinic review tool, if applicable. No additional management support is needed unless otherwise documented below in the visit note. 

## 2013-05-31 NOTE — Progress Notes (Signed)
Subjective:    Patient ID: Randall Ali, male    DOB: 20-Jan-1941, 72 y.o.   MRN: [id number]  HPI  Patient presents with congestion, cough, sinus drainage and sore throat. He reports throats "is the worse he has had in a long time".  He has taken Mucinex. He has gargled with salt water only once. Reports the throat was hurting too bad this morning. Symptoms began on Saturday. Felt slightly feverish this morning.  Current Outpatient Prescriptions on File Prior to Visit  Medication Sig Dispense Refill  . aspirin 81 MG EC tablet Take 81 mg by mouth daily.        . Calcium-Vitamin D (RA CALCIUM PLUS VITAMIN D) 600-125 MG-UNIT TABS Take 1 tablet by mouth daily.        . Cholecalciferol (VITAMIN D3) 400 UNITS CAPS Take 1 capsule by mouth daily.        . diclofenac (VOLTAREN) 75 MG EC tablet Take 1 tablet (75 mg total) by mouth 2 (two) times daily.  60 tablet  1  . diclofenac sodium (VOLTAREN) 1 % GEL Apply 4 g topically 4 (four) times daily.  500 Tube  1  . gabapentin (NEURONTIN) 100 MG capsule Take 1 capsule (100 mg total) by mouth at bedtime.  30 capsule  1  . indomethacin (INDOCIN) 50 MG capsule Take 1 capsule (50 mg total) by mouth 2 (two) times daily with a meal.  60 capsule  1  . methylPREDNIsolone (MEDROL DOSPACK) 4 MG tablet follow package directions  21 tablet  0  . metoprolol succinate (TOPROL-XL) 25 MG 24 hr tablet Take 0.5 tablets (12.5 mg total) by mouth daily.  45 tablet  3  . oxyCODONE-acetaminophen (PERCOCET) 10-325 MG per tablet Take 1 tablet by mouth 4 (four) times daily as needed.      . simvastatin (ZOCOR) 20 MG tablet Take 1 tablet (20 mg total) by mouth daily.  90 tablet  3  . tadalafil (CIALIS) 20 MG tablet Take 1 tablet (20 mg total) by mouth daily as needed for erectile dysfunction.  10 tablet  0  . traMADol (ULTRAM) 50 MG tablet Take 1 tablet (50 mg total) by mouth every 8 (eight) hours as needed.  50 tablet  1  . vitamin B-12 (CYANOCOBALAMIN) 1000 MCG tablet Take 1,000  mcg by mouth daily.        . Zinc 100 MG TABS Take 1 tablet by mouth daily.        [name] zolpidem (AMBIEN) 10 MG tablet TAKE ONE TABLET AT BEDTIME AS NEEDED FORSLEEP  15 tablet  3  . nitroGLYCERIN (NITROSTAT) 0.4 MG SL tablet Place 1 tablet (0.4 mg total) under the tongue every 5 (five) minutes as needed for chest pain.  25 tablet  11   No current facility-administered medications on file prior to visit.      Review of Systems  Constitutional: Positive for fever and fatigue. Negative for chills.  HENT: Positive for congestion, postnasal drip, rhinorrhea, sinus pressure and sore throat. Negative for ear pain.   Respiratory: Positive for cough. Negative for shortness of breath and wheezing.   Cardiovascular: Negative.   Gastrointestinal: Negative.        Objective:   Physical Exam  Constitutional: He is oriented to person, place, and time. He appears well-developed and well-nourished.  HENT:  Head: Normocephalic and atraumatic.  Pharyngeal erythema significant  Neck: No tracheal deviation present.  Cardiovascular: Normal rate, regular rhythm and normal heart sounds.   Pulmonary/Chest:  Effort normal and breath sounds normal. No respiratory distress. He has no wheezes. He has no rales.  Lymphadenopathy:    He has cervical adenopathy.  Neurological: He is alert and oriented to person, place, and time.  Skin: Skin is warm and dry.  Psychiatric: He has a normal mood and affect. His behavior is normal. Judgment and thought content normal.   BP 134/76  Pulse 84  Temp(Src) 98.2 F (36.8 C) (Oral)  Resp 14  Wt 213 lb (96.616 kg)  SpO2 93%        Assessment & Plan:

## 2013-06-15 ENCOUNTER — Encounter: Payer: Self-pay | Admitting: Podiatry

## 2013-06-15 ENCOUNTER — Ambulatory Visit: Payer: Self-pay

## 2013-06-15 ENCOUNTER — Ambulatory Visit (INDEPENDENT_AMBULATORY_CARE_PROVIDER_SITE_OTHER): Payer: Medicare HMO | Admitting: Podiatry

## 2013-06-15 VITALS — BP 134/81 | HR 65 | Resp 18

## 2013-06-15 DIAGNOSIS — Z9889 Other specified postprocedural states: Secondary | ICD-10-CM

## 2013-06-15 NOTE — Progress Notes (Signed)
   Subjective:    Patient ID: Randall Ali, male    DOB: Nov 17, 1940, 73 y.o.   MRN: [id number]  HPI Comments: Dos 10.10.14 right foot kellar arthroplasty , no better      Review of Systems     Objective:   Physical Exam: I have reviewed his past medical history medications and allergies. Vital signs are stable he is alert and oriented x3. Pulses are palpable to the right foot. Swelling erythema and edema is going down to the first metatarsophalangeal joint of the right foot. Is a good range of motion of the toe he is pain free however he does have pain on palpation of the tibial and fibular sesamoids of the right foot less tibial than previous. More than likely this is associated with the injection we gave him last time he was in. His lesser metatarsophalangeal joints are also painful this is because of his hammertoe deformities in a plantar grade pressure exerted on the metatarsal heads. I'm sure that he does have some compensation from the pain of the first metatarsophalangeal joint as well.        Assessment & Plan:  Assessment: Capsulitis and sesamoiditis of the first metatarsophalangeal joint of the right foot. 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  Plan: Injected from dorsal to plantar today to reach the fibular sesamoid injected 10 mg of Kenalog and local anesthetic to the area. He will followup with me in 3-4 weeks and

## 2013-06-20 ENCOUNTER — Encounter: Payer: Self-pay | Admitting: Internal Medicine

## 2013-06-20 NOTE — Progress Notes (Signed)
1.  REMOVAL FIXATION 1ST MTPJ RT 2.  POSSIBLE TIBIAL SESAMOIDECTOMY RT   3. POSSIBLE ARTHROPLASTY WITH IMPLANT RIGHT

## 2013-06-24 ENCOUNTER — Telehealth: Payer: Self-pay | Admitting: Internal Medicine

## 2013-06-24 NOTE — Telephone Encounter (Signed)
error 

## 2013-06-25 ENCOUNTER — Ambulatory Visit (INDEPENDENT_AMBULATORY_CARE_PROVIDER_SITE_OTHER): Payer: Medicare HMO | Admitting: Internal Medicine

## 2013-06-25 ENCOUNTER — Encounter: Payer: Self-pay | Admitting: Internal Medicine

## 2013-06-25 VITALS — BP 120/80 | HR 74 | Temp 97.8°F | Ht 70.0 in | Wt 216.2 lb

## 2013-06-25 DIAGNOSIS — J020 Streptococcal pharyngitis: Secondary | ICD-10-CM

## 2013-06-25 LAB — POCT RAPID STREP A (OFFICE): Rapid Strep A Screen: NEGATIVE

## 2013-06-25 MED ORDER — PENICILLIN V POTASSIUM 500 MG PO TABS: 500.0000 mg | ORAL_TABLET | Freq: Three times a day (TID) | ORAL | Status: DC

## 2013-06-25 NOTE — Progress Notes (Signed)
Subjective:    Patient ID: Randall Ali, male    DOB: Aug 18, 1940, 72 y.o.   MRN: [id number]  HPI 73YO male presents for acute visit. Thursday afternoon developed sore throat. Painful to swallow. Feels "swollen."  No fever, chills. No nasal congestion. No cough. Mild headache. No nausea. Strep in December. Feels similar to previous. Not taking anything for this.  Outpatient Encounter Prescriptions as of 06/25/2013  Medication Sig  . aspirin 81 MG EC tablet Take 81 mg by mouth daily.    . Calcium-Vitamin D (RA CALCIUM PLUS VITAMIN D) 600-125 MG-UNIT TABS Take 1 tablet by mouth daily.    . Cholecalciferol (VITAMIN D3) 400 UNITS CAPS Take 1 capsule by mouth daily.    [name] gabapentin (NEURONTIN) 100 MG capsule Take 1 capsule (100 mg total) by mouth at bedtime.  . indomethacin (INDOCIN) 50 MG capsule Take 1 capsule (50 mg total) by mouth 2 (two) times daily with a meal.  . metoprolol succinate (TOPROL-XL) 25 MG 24 hr tablet Take 0.5 tablets (12.5 mg total) by mouth daily.  [name] oxyCODONE-acetaminophen (PERCOCET) 10-325 MG per tablet Take 1 tablet by mouth 4 (four) times daily as needed.  . simvastatin (ZOCOR) 20 MG tablet Take 1 tablet (20 mg total) by mouth daily.  . tadalafil (CIALIS) 20 MG tablet Take 1 tablet (20 mg total) by mouth daily as needed for erectile dysfunction.  . traMADol (ULTRAM) 50 MG tablet Take 1 tablet (50 mg total) by mouth every 8 (eight) hours as needed.  . vitamin B-12 (CYANOCOBALAMIN) 1000 MCG tablet Take 1,000 mcg by mouth daily.    . Zinc 100 MG TABS Take 1 tablet by mouth daily.    [name] zolpidem (AMBIEN) 10 MG tablet TAKE ONE TABLET AT BEDTIME AS NEEDED FORSLEEP  . nitroGLYCERIN (NITROSTAT) 0.4 MG SL tablet Place 1 tablet (0.4 mg total) under the tongue every 5 (five) minutes as needed for chest pain.     Review of Systems  Constitutional: Negative for fever, chills, activity change and fatigue.  HENT: Positive for sore throat. Negative for congestion, ear discharge, ear  pain, hearing loss, nosebleeds, postnasal drip, rhinorrhea, sinus pressure, sneezing, tinnitus, trouble swallowing and voice change.   Eyes: Negative for discharge, redness, itching and visual disturbance.  Respiratory: Negative for cough, chest tightness, shortness of breath, wheezing and stridor.   Cardiovascular: Negative for chest pain and leg swelling.  Musculoskeletal: Negative for arthralgias, myalgias, neck pain and neck stiffness.  Skin: Negative for color change and rash.  Neurological: Negative for dizziness, facial asymmetry and headaches.  Psychiatric/Behavioral: Negative for sleep disturbance.   BP 120/80  Pulse 74  Temp(Src) 97.8 F (36.6 C) (Oral)  Ht 5\' 10"  (1.778 m)  Wt 216 lb 4 oz (98.09 kg)  BMI 31.03 kg/m2  SpO2 95%     Objective:   Physical Exam  Constitutional: He is oriented to person, place, and time. He appears well-developed and well-nourished. No distress.  HENT:  Head: Normocephalic and atraumatic.  Right Ear: External ear normal.  Left Ear: External ear normal.  Nose: Nose normal.  Mouth/Throat: Oropharyngeal exudate and posterior oropharyngeal erythema present.  Eyes: Conjunctivae and EOM are normal. Pupils are equal, round, and reactive to light. Right eye exhibits no discharge. Left eye exhibits no discharge. No scleral icterus.  Neck: Normal range of motion. Neck supple. No tracheal deviation present. No thyromegaly present.  Cardiovascular: Normal rate, regular rhythm and normal heart sounds.  Exam reveals no gallop and no friction rub.  No murmur heard. Pulmonary/Chest: Effort normal and breath sounds normal. No respiratory distress. He has no wheezes. He has no rales. He exhibits no tenderness.  Musculoskeletal: Normal range of motion. He exhibits no edema.  Lymphadenopathy:    He has cervical adenopathy.       Right cervical: Superficial cervical adenopathy present.       Left cervical: Superficial cervical adenopathy present.    Neurological: He is alert and oriented to person, place, and time. No cranial nerve deficit. Coordination normal.  Skin: Skin is warm and dry. No rash noted. He is not diaphoretic. No erythema. No pallor.  Psychiatric: He has a normal mood and affect. His behavior is normal. Judgment and thought content normal.          Assessment & Plan:

## 2013-06-25 NOTE — Patient Instructions (Signed)
Strep Throat  Strep throat is an infection of the throat caused by a bacteria named Streptococcus pyogenes. Your caregiver may call the infection streptococcal "tonsillitis" or "pharyngitis" depending on whether there are signs of inflammation in the tonsils or back of the throat. Strep throat is most common in children aged 72 15 years during the cold months of the year, but it can occur in people of any age during any season. This infection is spread from person to person (contagious) through coughing, sneezing, or other close contact.  SYMPTOMS   · Fever or chills.  · Painful, swollen, red tonsils or throat.  · Pain or difficulty when swallowing.  · White or yellow spots on the tonsils or throat.  · Swollen, tender lymph nodes or "glands" of the neck or under the jaw.  · Red rash all over the body (rare).  DIAGNOSIS   Many different infections can cause the same symptoms. A test must be done to confirm the diagnosis so the right treatment can be given. A "rapid strep test" can help your caregiver make the diagnosis in a few minutes. If this test is not available, a light swab of the infected area can be used for a throat culture test. If a throat culture test is done, results are usually available in a day or two.  TREATMENT   Strep throat is treated with antibiotic medicine.  HOME CARE INSTRUCTIONS   · Gargle with 1 tsp of salt in 1 cup of warm water, 3 4 times per day or as needed for comfort.  · Family members who also have a sore throat or fever should be tested for strep throat and treated with antibiotics if they have the strep infection.  · Make sure everyone in your household washes their hands well.  · Do not share food, drinking cups, or personal items that could cause the infection to spread to others.  · You may need to eat a soft food diet until your sore throat gets better.  · Drink enough water and fluids to keep your urine clear or pale yellow. This will help prevent dehydration.  · Get plenty of  rest.  · Stay home from school, daycare, or work until you have been on antibiotics for 24 hours.  · Only take over-the-counter or prescription medicines for pain, discomfort, or fever as directed by your caregiver.  · If antibiotics are prescribed, take them as directed. Finish them even if you start to feel better.  SEEK MEDICAL CARE IF:   · The glands in your neck continue to enlarge.  · You develop a rash, cough, or earache.  · You cough up green, yellow-brown, or bloody sputum.  · You have pain or discomfort not controlled by medicines.  · Your problems seem to be getting worse rather than better.  SEEK IMMEDIATE MEDICAL CARE IF:   · You develop any new symptoms such as vomiting, severe headache, stiff or painful neck, chest pain, shortness of breath, or trouble swallowing.  · You develop severe throat pain, drooling, or changes in your voice.  · You develop swelling of the neck, or the skin on the neck becomes red and tender.  · You have a fever.  · You develop signs of dehydration, such as fatigue, dry mouth, and decreased urination.  · You become increasingly sleepy, or you cannot wake up completely.  Document Released: 05/23/2000 Document Revised: 05/12/2012 Document Reviewed: 07/25/2010  [employer]® Patient Information ©2014 [employer].

## 2013-06-25 NOTE — Assessment & Plan Note (Signed)
Exam with erythema and exudate in posterior pharynx, tender cervical adenopathy and absence of congestion, cough most consistent with strep pharyngitis. Will treat with penicillin x 10 days. Ibuprofen or tylenol for pain. Follow up if no improvement or if worsening symptoms.

## 2013-06-25 NOTE — Progress Notes (Signed)
Pre-visit discussion using our clinic review tool. No additional management support is needed unless otherwise documented below in the visit note.  

## 2013-07-07 ENCOUNTER — Telehealth: Payer: Self-pay | Admitting: Emergency Medicine

## 2013-07-07 NOTE — Telephone Encounter (Signed)
Patient has been approved with 4 visits to see Dr. Ron Agee exp 10/01/13. Auth # [id number]

## 2013-07-13 ENCOUNTER — Encounter: Payer: Self-pay | Admitting: Podiatry

## 2013-07-13 ENCOUNTER — Ambulatory Visit (INDEPENDENT_AMBULATORY_CARE_PROVIDER_SITE_OTHER): Payer: Medicare HMO | Admitting: Podiatry

## 2013-07-13 ENCOUNTER — Ambulatory Visit (INDEPENDENT_AMBULATORY_CARE_PROVIDER_SITE_OTHER): Payer: Medicare HMO

## 2013-07-13 ENCOUNTER — Telehealth: Payer: Self-pay | Admitting: Emergency Medicine

## 2013-07-13 VITALS — BP 127/71 | HR 65 | Resp 16 | Ht 70.0 in | Wt 212.0 lb

## 2013-07-13 DIAGNOSIS — M2580 Other specified joint disorders, unspecified joint: Secondary | ICD-10-CM

## 2013-07-13 DIAGNOSIS — Z9889 Other specified postprocedural states: Secondary | ICD-10-CM

## 2013-07-13 DIAGNOSIS — M948X9 Other specified disorders of cartilage, unspecified sites: Secondary | ICD-10-CM

## 2013-07-13 MED ORDER — TRAMADOL HCL 50 MG PO TABS: 50.0000 mg | ORAL_TABLET | Freq: Four times a day (QID) | ORAL | Status: DC | PRN

## 2013-07-13 NOTE — Telephone Encounter (Signed)
Referral for [place] has been filled out and faxed to [facility]

## 2013-07-13 NOTE — Progress Notes (Signed)
He presents today followup of an arthroplasty with Jake Michaelis bunion repair implant first metatarsophalangeal joint right. He states it is just herbal my foot hurts on the bottom all the time right in here as he points to the fibular sesamoid.  Objective: Vital signs are stable he is alert and oriented x3. He has pain on palpation and fibular sesamoid right first metatarsophalangeal joint implant appears to be good position. Radiographically fibular sesamoid is present tibial sesamoid is absent.  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  Plan: Discussed etiology pathology conservative versus surgical therapies. At this point I did consent him for surgical excision of the fibular sesamoid. I also modified his orthotics. He will followup with [place] for surgical intervention in the near future.

## 2013-07-18 NOTE — Telephone Encounter (Signed)
Pt approved to see [facility] with 4 visits, exp 10/10/13. Auth # [id number]

## 2013-08-02 ENCOUNTER — Ambulatory Visit: Payer: Medicare Other | Admitting: Cardiovascular Disease

## 2013-08-03 NOTE — Telephone Encounter (Signed)
Referral underway for Randall Ali.

## 2013-08-04 ENCOUNTER — Ambulatory Visit: Payer: Commercial Managed Care - HMO | Admitting: Cardiovascular Disease

## 2013-08-08 ENCOUNTER — Encounter: Payer: Self-pay | Admitting: Cardiovascular Disease

## 2013-08-08 ENCOUNTER — Ambulatory Visit (INDEPENDENT_AMBULATORY_CARE_PROVIDER_SITE_OTHER): Payer: Commercial Managed Care - HMO | Admitting: Cardiovascular Disease

## 2013-08-08 VITALS — BP 142/78 | HR 64 | Ht 70.0 in | Wt 212.8 lb

## 2013-08-08 DIAGNOSIS — I2510 Atherosclerotic heart disease of native coronary artery without angina pectoris: Secondary | ICD-10-CM

## 2013-08-08 DIAGNOSIS — E785 Hyperlipidemia, unspecified: Secondary | ICD-10-CM

## 2013-08-08 DIAGNOSIS — R079 Chest pain, unspecified: Secondary | ICD-10-CM

## 2013-08-08 DIAGNOSIS — I10 Essential (primary) hypertension: Secondary | ICD-10-CM

## 2013-08-08 NOTE — Assessment & Plan Note (Signed)
Cholesterol is at goal on the current lipid regimen. No changes to the medications were made.  

## 2013-08-08 NOTE — Assessment & Plan Note (Signed)
Uncertain if he is having chronic stable angina. No interval increase in the symptom severity. No further workup at this time. Continue current medication regimen.

## 2013-08-08 NOTE — Patient Instructions (Signed)
You are doing well. No medication changes were made.  Please call the office if you have worsening chest pain We might need a stress test  Please call us if you have new issues that need to be addressed before your next appt.  Your physician wants you to follow-up in: 12 months.  You will receive a reminder letter in the mail two months in advance. If you don't receive a letter, please call our office to schedule the follow-up appointment.

## 2013-08-08 NOTE — Assessment & Plan Note (Signed)
Blood pressure is well controlled on today's visit. No changes made to the medications. 

## 2013-08-08 NOTE — Progress Notes (Signed)
Patient ID: Randall Ali, male    DOB: 1941-03-15, 73 y.o.   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 Previous admit in 2011 for for mild chest pain and sweating. Stress test showed no ischemia with inferior scar. Previous cardiac catheterization showing moderate OM disease.   In 2013, he was having worsening chest pain medically with exertion.  cardiac catheterization that showed moderate obtuse marginal disease, otherwise no significant stenosis. This was discussed with the interventional physicians in [place] and recommendation was made for medical management.   In followup today, He continues to have rare chest pain on the left Is relatively unchanged from previous visits. Also has inability to walk long distances secondary to bone spur in his foot. Previous toe surgery, now scheduled for other orthopedic surgery on his foot in the near future. He continues to workout at the gym using the bike, weights. Able to walk but not far. Does not seem to have reproducible chest pain with exertion Blood pressure at home is well-controlled  Total cholesterol 140, LDL 78 prior EGD  that showed an ulcer. Previously had trouble swallowing.  EKG shows normal sinus rhythm with rate 64  beats per minute, no significant ST or T wave changes    Outpatient Encounter Prescriptions as of 08/08/2013  Medication Sig  . aspirin 81 MG EC tablet Take 81 mg by mouth daily.    . Calcium-Vitamin D (RA CALCIUM PLUS VITAMIN D) 600-125 MG-UNIT TABS Take 1 tablet by mouth daily.    . metoprolol succinate (TOPROL-XL) 25 MG 24 hr tablet Take 0.5 tablets (12.5 mg total) by mouth daily.  . nitroGLYCERIN (NITROSTAT) 0.4 MG SL tablet Place 1 tablet (0.4 mg total) under the tongue every 5 (five) minutes as  needed for chest pain.  . simvastatin (ZOCOR) 20 MG tablet Take 1 tablet (20 mg total) by mouth daily.  . tadalafil (CIALIS) 20 MG tablet Take 1 tablet (20 mg total) by mouth daily as needed for erectile dysfunction.  . traMADol (ULTRAM) 50 MG tablet Take 1 tablet (50 mg total) by mouth every 6 (six) hours as needed.  . Zinc 100 MG TABS Take 1 tablet by mouth daily.    [name] zolpidem (AMBIEN) 10 MG tablet TAKE ONE TABLET AT BEDTIME AS NEEDED FORSLEEP    Review of Systems  Constitutional: Negative.   HENT: Negative.   Eyes: Negative.   Respiratory: Negative.   Cardiovascular: Positive for chest pain.  Gastrointestinal: Negative.   Endocrine: Negative.   Musculoskeletal: Negative.   Skin: Negative.   Allergic/Immunologic: Negative.   Neurological: Negative.   Hematological: Negative.   Psychiatric/Behavioral: Negative.   All other systems reviewed and are negative.   BP 142/78  Pulse 64  Ht 5\' 10"  (1.778 m)  Wt 212 lb 12 oz (96.503 kg)  BMI 30.53 kg/m2  Physical Exam  Nursing note and vitals reviewed. Constitutional: He is oriented to person, place, and time. He appears well-developed and well-nourished.  HENT:  Head: Normocephalic.  Nose: Nose normal.  Mouth/Throat: Oropharynx is clear and moist.  Eyes: Conjunctivae are normal. Pupils are equal, round, and reactive to light.  Neck: Normal range of motion. Neck supple. No JVD present.  Cardiovascular: Normal rate, regular rhythm, S1 normal, S2 normal, normal heart sounds and intact distal pulses.  Exam  reveals no gallop and no friction rub.   No murmur heard. Pulmonary/Chest: Effort normal and breath sounds normal. No respiratory distress. He has no wheezes. He has no rales. He exhibits no tenderness.  Abdominal: Soft. Bowel sounds are normal. He exhibits no distension. There is no tenderness.  Musculoskeletal: Normal range of motion. He exhibits no edema and no tenderness.  Lymphadenopathy:    He has no cervical adenopathy.   Neurological: He is alert and oriented to person, place, and time. Coordination normal.  Skin: Skin is warm and dry. No rash noted. No erythema.  Psychiatric: He has a normal mood and affect. His behavior is normal. Judgment and thought content normal.      Assessment and Plan

## 2013-09-06 ENCOUNTER — Telehealth: Payer: Self-pay | Admitting: Internal Medicine

## 2013-09-06 NOTE — Telephone Encounter (Signed)
Please advise,

## 2013-09-06 NOTE — Telephone Encounter (Signed)
Orthopedists do not treat feet,  podiateists do, is he wanting a second opinon on the foot pain that Dr Milinda Pointer was treating?

## 2013-09-06 NOTE — Telephone Encounter (Signed)
The patient is having a lot of right foot pain and he wants to be referred to an orthopedic doctor.

## 2013-09-07 NOTE — Telephone Encounter (Signed)
Left message for patient to call office.  

## 2013-09-13 ENCOUNTER — Other Ambulatory Visit: Payer: Self-pay | Admitting: Cardiovascular Disease

## 2013-10-06 ENCOUNTER — Ambulatory Visit (INDEPENDENT_AMBULATORY_CARE_PROVIDER_SITE_OTHER): Payer: Commercial Managed Care - HMO | Admitting: Internal Medicine

## 2013-10-06 ENCOUNTER — Telehealth: Payer: Self-pay | Admitting: Internal Medicine

## 2013-10-06 ENCOUNTER — Encounter: Payer: Self-pay | Admitting: Internal Medicine

## 2013-10-06 VITALS — BP 128/70 | HR 68 | Temp 97.6°F | Resp 16 | Wt 212.5 lb

## 2013-10-06 DIAGNOSIS — E559 Vitamin D deficiency, unspecified: Secondary | ICD-10-CM

## 2013-10-06 DIAGNOSIS — Z1211 Encounter for screening for malignant neoplasm of colon: Secondary | ICD-10-CM

## 2013-10-06 DIAGNOSIS — E785 Hyperlipidemia, unspecified: Secondary | ICD-10-CM

## 2013-10-06 DIAGNOSIS — C4490 Unspecified malignant neoplasm of skin, unspecified: Secondary | ICD-10-CM

## 2013-10-06 DIAGNOSIS — R5383 Other fatigue: Secondary | ICD-10-CM

## 2013-10-06 DIAGNOSIS — I10 Essential (primary) hypertension: Secondary | ICD-10-CM

## 2013-10-06 DIAGNOSIS — L089 Local infection of the skin and subcutaneous tissue, unspecified: Secondary | ICD-10-CM

## 2013-10-06 DIAGNOSIS — R5381 Other malaise: Secondary | ICD-10-CM

## 2013-10-06 MED ORDER — TRAMADOL HCL 50 MG PO TABS: 50.0000 mg | ORAL_TABLET | Freq: Four times a day (QID) | ORAL | Status: DC | PRN

## 2013-10-06 NOTE — Progress Notes (Signed)
Pre-visit discussion using our clinic review tool. No additional management support is needed unless otherwise documented below in the visit note.  

## 2013-10-06 NOTE — Telephone Encounter (Signed)
Pt came in today stating he has an appointment with [facility] on may 7  Pt stated he needs a referral to them  He has humana gold hmo [phone]

## 2013-10-06 NOTE — Progress Notes (Signed)
Patient ID: Randall Ali, male   DOB: April 21, 1941, 73 y.o.   MRN: [id number]  Patient Active Problem List   Diagnosis Date Noted  . Diastasis recti 05/19/2013  . Inflammation of toe 05/19/2013  . H/O arthroplasty 05/19/2013  . Screening for prostate cancer 02/27/2012  . Routine general medical examination at a health care facility 02/27/2012  . Erectile dysfunction 02/27/2012  . HLD (hyperlipidemia)   . HTN (hypertension)   . Dysphagia 12/24/2011  . Pulmonary nodule 12/24/2011  . HYPERLIPIDEMIA-MIXED 03/12/2010  . CHEST PAIN-UNSPECIFIED 03/12/2010  . OLD MYOCARDIAL INFARCTION 03/08/2009  . CAD, NATIVE VESSEL 03/08/2009    Subjective:  CC:   Chief Complaint  Patient presents with  . Follow-up    6 month has an appointment with [facility] but needs a referral for insurance.    HPI:   Randall Ali is a 73 y.o. male who presents for 6 month follow up on hypertension , hyperlipidemia.  He feels generally well except for persistent toe  Despite his prior surgery by podiatry. It is interfering with his exercise,  He has received a second opinion from Dr. Debby Bud and is having corrective surgery in June .     Past Medical History  Diagnosis Date  . CAD (coronary artery disease)     s/p inferior wall infarct in 10/01. has stent in Rt coronary artery. is due a stress myoview. does have some dyspnea on exertion  . SOB (shortness of breath) on exertion   . 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 Chest pain   . HLD (hyperlipidemia)     isdue followup lipids. on zocor 10 mg/day   . HTN (hypertension)     Past Surgical History  Procedure Laterality Date  . Cardiac catheterization  06/24/11  . Carotid stent  03/10/2011  . Arm surgery  2010  . Shoulder arthroscopy  2012  . Wrist surgery  2011  . Toe surgery  1994  . Nasal sinus surgery  2008    septpolasty, bilateral turbinate reduction  . Hammer toe surgery      right toe       The following  portions of the patient's history were reviewed and updated as appropriate: Allergies, current medications, and problem list.    Review of Systems:   Patient denies headache, fevers, malaise, unintentional weight loss, skin rash, eye pain, sinus congestion and sinus pain, sore throat, dysphagia,  hemoptysis , cough, dyspnea, wheezing, chest pain, palpitations, orthopnea, edema, abdominal pain, nausea, melena, diarrhea, constipation, flank pain, dysuria, hematuria, urinary  Frequency, nocturia, numbness, tingling, seizures,  Focal weakness, Loss of consciousness,  Tremor, insomnia, depression, anxiety, and suicidal ideation.     History   Social History  . Marital Status: Single    Spouse Name: N/A    Number of Children: N/A  . Years of Education: 12   Occupational History  . retired     [employer]   Social History Main Topics  . Smoking status: Former Smoker -- 2.50 packs/day for 40 years    Types: Cigarettes    Quit date: 03/24/2000  . Smokeless tobacco: Never Used  . Alcohol Use: 6.0 oz/week    10 Shots of liquor per week  . Drug Use: No  . Sexual Activity: Not on file   Other Topics Concern  . Not on file   Social History Narrative   Singled; retired, part time; gets regular exercise.    Caffeine Use-yes  Objective:  Filed Vitals:   10/06/13 0856  BP: 128/70  Pulse: 68  Temp: 97.6 F (36.4 C)  Resp: 16     General appearance: alert, cooperative and appears stated age Ears: normal TM's and external ear canals both ears Throat: lips, mucosa, and tongue normal; teeth and gums normal Neck: no adenopathy, no carotid bruit, supple, symmetrical, trachea midline and thyroid not enlarged, symmetric, no tenderness/mass/nodules Back: symmetric, no curvature. ROM normal. No CVA tenderness. Lungs: clear to auscultation bilaterally Heart: regular rate and rhythm, S1, S2 normal, no murmur, click, rub or gallop Abdomen: soft, non-tender; bowel sounds normal; no  masses,  no organomegaly Pulses: 2+ and symmetric Skin: Skin color, texture, turgor normal. No rashes or lesions Lymph nodes: Cervical, supraclavicular, and axillary nodes normal.  Assessment and Plan:  Inflammation of toe He has had a second opinion on the source of his persistnet pain and is scheduled for surgery in June by DrDebby Bud  HLD (hyperlipidemia) Well controlled on current statin therapy.   Liver enzymes are normal , no changes today.  Lab Results  Component Value Date   CHOL 140 10/07/2013   HDL 46.70 10/07/2013   LDLCALC 86 10/07/2013   LDLDIRECT 78.0 04/05/2013   TRIG 36.0 10/07/2013   CHOLHDL 3 10/07/2013   Lab Results  Component Value Date   ALT 22 10/07/2013   AST 17 10/07/2013   ALKPHOS 55 10/07/2013   BILITOT 0.8 10/07/2013     HTN (hypertension) Well controlled on current regimen. Renal function stable, no changes today.  Lab Results  Component Value Date   CREATININE 0.9 10/07/2013   Lab Results  Component Value Date   NA 139 10/07/2013   K 5.0 10/07/2013   CL 105 10/07/2013   CO2 28 10/07/2013       Updated Medication List Outpatient Encounter Prescriptions as of 10/06/2013  Medication Sig  . aspirin 81 MG EC tablet Take 81 mg by mouth daily.    . Calcium-Vitamin D (RA CALCIUM PLUS VITAMIN D) 600-125 MG-UNIT TABS Take 1 tablet by mouth daily.    . metoprolol succinate (TOPROL-XL) 25 MG 24 hr tablet Take 0.5 tablets (12.5 mg total) by mouth daily.  . simvastatin (ZOCOR) 20 MG tablet TAKE ONE (1) TABLET EACH DAY  . tadalafil (CIALIS) 20 MG tablet Take 1 tablet (20 mg total) by mouth daily as needed for erectile dysfunction.  . traMADol (ULTRAM) 50 MG tablet Take 1 tablet (50 mg total) by mouth every 6 (six) hours as needed.  . Zinc 100 MG TABS Take 1 tablet by mouth daily.    [name] zolpidem (AMBIEN) 10 MG tablet TAKE ONE TABLET AT BEDTIME AS NEEDED FORSLEEP  . [DISCONTINUED] traMADol (ULTRAM) 50 MG tablet Take 1 tablet (50 mg total) by mouth every 6 (six) hours as  needed.  . nitroGLYCERIN (NITROSTAT) 0.4 MG SL tablet Place 1 tablet (0.4 mg total) under the tongue every 5 (five) minutes as needed for chest pain.     Orders Placed This Encounter  Procedures  . Lipid panel  . Comprehensive metabolic panel  . CBC with Differential  . TSH  . Vit D  25 hydroxy (rtn osteoporosis monitoring)  . Ambulatory referral to Dermatology  . Ambulatory referral to Gastroenterology    No Follow-up on file.

## 2013-10-07 ENCOUNTER — Other Ambulatory Visit (INDEPENDENT_AMBULATORY_CARE_PROVIDER_SITE_OTHER): Payer: Commercial Managed Care - HMO

## 2013-10-07 DIAGNOSIS — E785 Hyperlipidemia, unspecified: Secondary | ICD-10-CM

## 2013-10-07 DIAGNOSIS — E559 Vitamin D deficiency, unspecified: Secondary | ICD-10-CM

## 2013-10-07 DIAGNOSIS — R5381 Other malaise: Secondary | ICD-10-CM

## 2013-10-07 DIAGNOSIS — R5383 Other fatigue: Secondary | ICD-10-CM

## 2013-10-07 LAB — CBC WITH DIFFERENTIAL/PLATELET
Basophils Absolute: 0 10*3/uL (ref 0.0–0.1)
Basophils Relative: 0.6 % (ref 0.0–3.0)
Eosinophils Absolute: 0.2 10*3/uL (ref 0.0–0.7)
Eosinophils Relative: 3.7 % (ref 0.0–5.0)
HCT: 45.4 % (ref 39.0–52.0)
Hemoglobin: 15.6 g/dL (ref 13.0–17.0)
Lymphocytes Relative: 31.4 % (ref 12.0–46.0)
Lymphs Abs: 1.8 10*3/uL (ref 0.7–4.0)
MCHC: 34.3 g/dL (ref 30.0–36.0)
MCV: 91.2 fl (ref 78.0–100.0)
Monocytes Absolute: 0.8 10*3/uL (ref 0.1–1.0)
Monocytes Relative: 13.5 % — ABNORMAL HIGH (ref 3.0–12.0)
Neutro Abs: 2.9 10*3/uL (ref 1.4–7.7)
Neutrophils Relative %: 50.8 % (ref 43.0–77.0)
Platelets: 183 10*3/uL (ref 150.0–400.0)
RBC: 4.98 Mil/uL (ref 4.22–5.81)
RDW: 12.9 % (ref 11.5–14.6)
WBC: 5.8 10*3/uL (ref 4.5–10.5)

## 2013-10-07 LAB — COMPREHENSIVE METABOLIC PANEL
ALT: 22 U/L (ref 0–53)
AST: 17 U/L (ref 0–37)
Albumin: 4.2 g/dL (ref 3.5–5.2)
Alkaline Phosphatase: 55 U/L (ref 39–117)
BUN: 16 mg/dL (ref 6–23)
CO2: 28 mEq/L (ref 19–32)
Calcium: 9.5 mg/dL (ref 8.4–10.5)
Chloride: 105 mEq/L (ref 96–112)
Creatinine, Ser: 0.9 mg/dL (ref 0.4–1.5)
GFR: 90.58 mL/min (ref >60.00)
Glucose, Bld: 92 mg/dL (ref 70–99)
Potassium: 5 mEq/L (ref 3.5–5.1)
Sodium: 139 mEq/L (ref 135–145)
Total Bilirubin: 0.8 mg/dL (ref 0.3–1.2)
Total Protein: 6.7 g/dL (ref 6.0–8.3)

## 2013-10-07 LAB — LIPID PANEL
Cholesterol: 140 mg/dL (ref 0–200)
HDL: 46.7 mg/dL (ref >39.00)
LDL Cholesterol: 86 mg/dL (ref 0–99)
Total CHOL/HDL Ratio: 3
Triglycerides: 36 mg/dL (ref 0.0–149.0)
VLDL: 7.2 mg/dL (ref 0.0–40.0)

## 2013-10-07 LAB — TSH: TSH: 0.9 u[IU]/mL (ref 0.35–5.50)

## 2013-10-07 NOTE — Telephone Encounter (Signed)
Anselm Jungling   She needed to get the cpt code for  precert  It was left off referral paper  [phone]  Ext [phone]

## 2013-10-08 ENCOUNTER — Encounter: Payer: Self-pay | Admitting: Internal Medicine

## 2013-10-08 LAB — VITAMIN D 25 HYDROXY (VIT D DEFICIENCY, FRACTURES): Vit D, 25-Hydroxy: 48 ng/mL (ref 30–89)

## 2013-10-08 NOTE — Assessment & Plan Note (Signed)
He has had a second opinion on the source of his persistnet pain and is scheduled for surgery in June by DrDebby Bud

## 2013-10-08 NOTE — Assessment & Plan Note (Signed)
Well controlled on current statin therapy.   Liver enzymes are normal , no changes today.  Lab Results  Component Value Date   CHOL 140 10/07/2013   HDL 46.70 10/07/2013   LDLCALC 86 10/07/2013   LDLDIRECT 78.0 04/05/2013   TRIG 36.0 10/07/2013   CHOLHDL 3 10/07/2013   Lab Results  Component Value Date   ALT 22 10/07/2013   AST 17 10/07/2013   ALKPHOS 55 10/07/2013   BILITOT 0.8 10/07/2013

## 2013-10-08 NOTE — Assessment & Plan Note (Signed)
Well controlled on current regimen. Renal function stable, no changes today.  Lab Results  Component Value Date   CREATININE 0.9 10/07/2013   Lab Results  Component Value Date   NA 139 10/07/2013   K 5.0 10/07/2013   CL 105 10/07/2013   CO2 28 10/07/2013

## 2013-10-10 ENCOUNTER — Encounter: Payer: Self-pay | Admitting: *Deleted

## 2013-10-14 NOTE — Telephone Encounter (Signed)
We received auth from [employer] patient approved for 4 visits from 10/12/13-01/10/14 with Dr. Evorn Gong.

## 2013-10-25 ENCOUNTER — Other Ambulatory Visit: Payer: Self-pay | Admitting: *Deleted

## 2013-10-25 MED ORDER — METOPROLOL SUCCINATE ER 25 MG PO TB24: 12.5000 mg | ORAL_TABLET | Freq: Every day | ORAL | Status: DC

## 2013-10-27 ENCOUNTER — Encounter: Payer: Self-pay | Admitting: Internal Medicine

## 2013-10-27 ENCOUNTER — Ambulatory Visit (INDEPENDENT_AMBULATORY_CARE_PROVIDER_SITE_OTHER): Payer: Commercial Managed Care - HMO | Admitting: Internal Medicine

## 2013-10-27 VITALS — BP 118/68 | HR 65 | Temp 98.1°F | Wt 211.0 lb

## 2013-10-27 DIAGNOSIS — J309 Allergic rhinitis, unspecified: Secondary | ICD-10-CM

## 2013-10-27 DIAGNOSIS — J322 Chronic ethmoidal sinusitis: Secondary | ICD-10-CM

## 2013-10-27 MED ORDER — FLUTICASONE PROPIONATE 50 MCG/ACT NA SUSP: 2.0000 | Freq: Every day | NASAL | Status: DC

## 2013-10-27 MED ORDER — AMOXICILLIN 875 MG PO TABS: 875.0000 mg | ORAL_TABLET | Freq: Two times a day (BID) | ORAL | Status: DC

## 2013-10-27 NOTE — Progress Notes (Signed)
HPI  Pt presents to the clinic today with c/o headache, watery eyes and nasal congestion. This started about 2 weeks. He is not blowing anything out of his nose or coughing anything up. He denies fever, chills or body aches. He has tried benadryl, sudafed, saline nasal spray and salt water gargles without relirf. He has no history of seasonal allergies or asthma. He has not had sick contacts.  Review of Systems    Past Medical History  Diagnosis Date  . CAD (coronary artery disease)     s/p inferior wall infarct in 10/01. has stent in Rt coronary artery. is due a stress myoview. does have some dyspnea on exertion  . SOB (shortness of breath) on exertion   . 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 Chest pain   . HLD (hyperlipidemia)     isdue followup lipids. on zocor 10 mg/day   . HTN (hypertension)     Family History  Problem Relation Age of Onset  . Hypertension Mother   . Heart disease Mother   . Hypertension Father   . Diabetes Father   . Cancer Paternal Grandfather   . Heart disease Brother 53    History   Social History  . Marital Status: Single    Spouse Name: N/A    Number of Children: N/A  . Years of Education: 12   Occupational History  . retired     [employer]   Social History Main Topics  . Smoking status: Former Smoker -- 2.50 packs/day for 40 years    Types: Cigarettes    Quit date: 03/24/2000  . Smokeless tobacco: Never Used  . Alcohol Use: 6.0 oz/week    10 Shots of liquor per week     Comment: occasional  . Drug Use: No  . Sexual Activity: Not on file   Other Topics Concern  . Not on file   Social History Narrative   Singled; retired, part time; gets regular exercise.    Caffeine Use-yes          No Known Allergies   Constitutional: Positive headache, fatigue. Denies fever or abrupt weight changes.  HEENT:  Positive facial pain. Denies eye redness, ear pain, ringing in the ears, wax buildup, runny nose or bloody  nose. Respiratory: Positive. Denies cough, difficulty breathing or shortness of breath.  Cardiovascular: Denies chest pain, chest tightness, palpitations or swelling in the hands or feet.   No other specific complaints in a complete review of systems (except as listed in HPI above).  Objective:    General: Appears his stated age, well developed, well nourished in NAD. HEENT: Head: normal shape and size, ethmoid sinus tenderness noted; Eyes: sclera white, no icterus, conjunctiva pink, PERRLA and EOMs intact; Ears: Tm's gray and intact, normal light reflex; Nose: mucosa pink and moist, septum midline; Throat/Mouth: + PND. Teeth present, mucosa pink and moist, no exudate noted, no lesions or ulcerations noted.  Neck: Neck supple, trachea midline. No massses, lumps or thyromegaly present.  Cardiovascular: Normal rate and rhythm. S1,S2 noted.  No murmur, rubs or gallops noted. No JVD or BLE edema. No carotid bruits noted. Pulmonary/Chest: Normal effort and positive vesicular breath sounds. No respiratory distress. No wheezes, rales or ronchi noted.      Assessment & Plan:   Allergic rhinitis, possible early sinusitis  Can use a Neti Pot which can be purchased from your local drug store. Flonase 2 sprays each nostril for 3 days and then  as needed. Will give RX for amoxil BID x 10 days if symptoms worsen (due to it being a holiday weekend)  RTC as needed or if symptoms persist.

## 2013-10-27 NOTE — Progress Notes (Signed)
Pre visit review using our clinic review tool, if applicable. No additional management support is needed unless otherwise documented below in the visit note. 

## 2013-10-27 NOTE — Patient Instructions (Addendum)

## 2013-11-09 DIAGNOSIS — I10 Essential (primary) hypertension: Secondary | ICD-10-CM | POA: Insufficient documentation

## 2013-11-09 DIAGNOSIS — E785 Hyperlipidemia, unspecified: Secondary | ICD-10-CM | POA: Insufficient documentation

## 2013-11-09 DIAGNOSIS — M79676 Pain in unspecified toe(s): Secondary | ICD-10-CM | POA: Insufficient documentation

## 2013-11-14 ENCOUNTER — Encounter: Payer: Self-pay | Admitting: Internal Medicine

## 2013-11-15 ENCOUNTER — Encounter: Payer: Self-pay | Admitting: Family Medicine

## 2013-11-15 ENCOUNTER — Ambulatory Visit (INDEPENDENT_AMBULATORY_CARE_PROVIDER_SITE_OTHER): Payer: Commercial Managed Care - HMO | Admitting: Family Medicine

## 2013-11-15 VITALS — BP 124/70 | HR 68 | Temp 97.6°F | Ht 70.0 in | Wt 213.5 lb

## 2013-11-15 DIAGNOSIS — J322 Chronic ethmoidal sinusitis: Secondary | ICD-10-CM

## 2013-11-15 MED ORDER — AMOXICILLIN-POT CLAVULANATE 875-125 MG PO TABS: 1.0000 | ORAL_TABLET | Freq: Two times a day (BID) | ORAL | Status: DC

## 2013-11-15 MED ORDER — TRAMADOL HCL 50 MG PO TABS: 50.0000 mg | ORAL_TABLET | Freq: Four times a day (QID) | ORAL | Status: DC | PRN

## 2013-11-15 NOTE — Progress Notes (Signed)
[name] [address] Phone: [phone] Fax: [phone]  Patient ID: Randall Ali MRN: [id number], DOB: [birth year], 73 y.o. Date of Encounter: 11/15/2013  Primary Physician:  Deborra Medina, MD   Chief Complaint: Sinusitis   Subjective:   History of Present Illness:  Randall Ali is a 73 y.o. very pleasant male patient who presents with the following:  Has been sick now for about a month - next day back. Has had sinus surgery. Has never stayed this long.   Last Thursday, recent foot surgery at [facility].   4 weeks, did not help a bit. Benadryl last Thursday.  S/p treatment failure of amox at 875 mg bid x 10 days  S/p prior sinus surgery   Past Medical History, Surgical History, Social History, Family History, Problem List, Medications, and Allergies have been reviewed and updated if relevant.  Review of Systems: ROS: GEN: Acute illness details above GI: Tolerating PO intake GU: maintaining adequate hydration and urination Pulm: No SOB Interactive and getting along well at home.  Otherwise, ROS is as per the HPI.   Objective:   Physical Examination: BP 124/70  Pulse 68  Temp(Src) 97.6 F (36.4 C) (Oral)  Ht 5\' 10"  (1.778 m)  Wt 213 lb 8 oz (96.843 kg)  BMI 30.63 kg/m2   Gen: WDWN, NAD; alert,appropriate and cooperative throughout exam  HEENT: Normocephalic and atraumatic. Throat clear, w/o exudate, no LAD, R TM clear, L TM - good landmarks, No fluid present. rhinnorhea.  Left frontal and maxillary sinuses: Tender, ethmoid Right frontal and maxillary sinuses: Tender, ethmoid  Neck: No ant or post LAD CV: RRR, No M/G/R Pulm: Breathing comfortably in no resp distress. no w/c/r Abd: S,NT,ND,+BS Extr: no c/c/e Psych: full affect, pleasant    Laboratory and Imaging Data:  Assessment & Plan:   Ethmoid sinusitis  Treatment failure, s/p sinus surgery and recent foot surgery.  New Prescriptions   AMOXICILLIN-CLAVULANATE (AUGMENTIN)  875-125 MG PER TABLET    Take 1 tablet by mouth 2 (two) times daily.   Modified Medications   Modified Medication Previous Medication   TRAMADOL (ULTRAM) 50 MG TABLET traMADol (ULTRAM) 50 MG tablet      Take 1 tablet (50 mg total) by mouth every 6 (six) hours as needed.    Take 1 tablet (50 mg total) by mouth every 6 (six) hours as needed.   No orders of the defined types were placed in this encounter.   Follow-up: No Follow-up on file. Unless noted above, the patient is to follow-up if symptoms worsen. Red flags were reviewed with the patient.  Signed,  Randall Ali. Randall Deiter, MD, [facility]   Discontinued Medications   AMOXICILLIN (AMOXIL) 875 MG TABLET    Take 1 tablet (875 mg total) by mouth 2 (two) times daily.   Current Medications at Discharge:   Medication List       This list is accurate as of: 11/15/13 11:37 AM.  Always use your most recent med list.               amoxicillin-clavulanate 875-125 MG per tablet  Commonly known as:  AUGMENTIN  Take 1 tablet by mouth 2 (two) times daily.     aspirin 81 MG EC tablet  Take 81 mg by mouth daily.     fluticasone 50 MCG/ACT nasal spray  Commonly known as:  FLONASE  Place 2 sprays into both nostrils daily.     metoprolol succinate 25 MG 24 hr tablet  Commonly known as:  TOPROL-XL  Take 0.5 tablets (12.5 mg total) by mouth daily.     nitroGLYCERIN 0.4 MG SL tablet  Commonly known as:  NITROSTAT  Place 1 tablet (0.4 mg total) under the tongue every 5 (five) minutes as needed for chest pain.     RA CALCIUM PLUS VITAMIN D 600-125 MG-UNIT Tabs  Generic drug:  Calcium-Vitamin D  Take 1 tablet by mouth daily.     simvastatin 20 MG tablet  Commonly known as:  ZOCOR  TAKE ONE (1) TABLET EACH DAY     tadalafil 20 MG tablet  Commonly known as:  CIALIS  Take 1 tablet (20 mg total) by mouth daily as needed for erectile dysfunction.     traMADol 50 MG tablet  Commonly known as:  ULTRAM  Take 1 tablet (50 mg total)  by mouth every 6 (six) hours as needed.     Zinc 100 MG Tabs  Take 1 tablet by mouth daily.     zolpidem 10 MG tablet  Commonly known as:  AMBIEN  TAKE ONE TABLET AT BEDTIME AS NEEDED FORSLEEP

## 2013-11-15 NOTE — Progress Notes (Signed)
Pre visit review using our clinic review tool, if applicable. No additional management support is needed unless otherwise documented below in the visit note. 

## 2013-11-28 ENCOUNTER — Ambulatory Visit: Payer: Commercial Managed Care - HMO | Admitting: Internal Medicine

## 2013-11-28 ENCOUNTER — Telehealth: Payer: Self-pay | Admitting: Internal Medicine

## 2013-11-28 NOTE — Telephone Encounter (Signed)
Pt called at 1:04 to cancel appt today at 5:15p. Pt will call back to reschedule/msn

## 2013-11-28 NOTE — Telephone Encounter (Signed)
FYI

## 2013-12-06 ENCOUNTER — Ambulatory Visit (INDEPENDENT_AMBULATORY_CARE_PROVIDER_SITE_OTHER): Payer: Commercial Managed Care - HMO | Admitting: Adult Health

## 2013-12-06 ENCOUNTER — Encounter: Payer: Self-pay | Admitting: Adult Health

## 2013-12-06 VITALS — BP 122/70 | HR 61 | Temp 97.8°F | Resp 16 | Ht 70.0 in | Wt 211.2 lb

## 2013-12-06 DIAGNOSIS — J322 Chronic ethmoidal sinusitis: Secondary | ICD-10-CM

## 2013-12-06 MED ORDER — PREDNISONE 10 MG PO TABS: ORAL_TABLET | ORAL | Status: DC

## 2013-12-06 NOTE — Patient Instructions (Signed)
  Start prednisone taper as follows:  Day #1 - take 6 tablets Day #2 - take 5 tablets Day #3 - take 4 tablets Day #4 - take 3 tablets Day #5 - take 2 tablets Day #6 - take 1 tablet  Take the prednisone with food.  Start the saline spray. Use this spray multiple times throughout the day.  I am referring you to Dr. Tami Ribas for evaluation.

## 2013-12-06 NOTE — Progress Notes (Signed)
Pre visit review using our clinic review tool, if applicable. No additional management support is needed unless otherwise documented below in the visit note. 

## 2013-12-06 NOTE — Progress Notes (Signed)
Subjective:    Patient ID: Randall Ali., male    DOB: [birth year], 73 y.o.   MRN: [id number]  Sinusitis Associated symptoms include sinus pressure (ethmoid).    Pleasant 73 yo male presents today for continuation of sinus pressure. Hx of sinus sgy by Dr. Tami Ribas of [facility]. Symptoms started about 2 months ago when he was bush-hogging. Has been completed 2 rounds of antibiotics (Amoxicillin and Augmentin) and has marginal improvement. Overall he states he just does not feel good.  Indicates ethmoidal sinus region is where he continues to have pressure. Denies fever, coughing, sore throat, or nasal discharge. Stopped his flonase because he did not find this beneficial. He also stopped his saline nasal spray.  Past Medical History  Diagnosis Date  . CAD (coronary artery disease)     s/p inferior wall infarct in 10/01. has stent in Rt coronary artery. is due a stress myoview. does have some dyspnea on exertion  . SOB (shortness of breath) on exertion   . 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 Chest pain   . HLD (hyperlipidemia)     isdue followup lipids. on zocor 10 mg/day   . HTN (hypertension)     Current Outpatient Prescriptions on File Prior to Visit  Medication Sig Dispense Refill  . aspirin 81 MG EC tablet Take 81 mg by mouth daily.        . Calcium-Vitamin D (RA CALCIUM PLUS VITAMIN D) 600-125 MG-UNIT TABS Take 1 tablet by mouth daily.        . fluticasone (FLONASE) 50 MCG/ACT nasal spray Place 2 sprays into both nostrils daily.  16 g  6  . metoprolol succinate (TOPROL-XL) 25 MG 24 hr tablet Take 0.5 tablets (12.5 mg total) by mouth daily.  45 tablet  1  . nitroGLYCERIN (NITROSTAT) 0.4 MG SL tablet Place 1 tablet (0.4 mg total) under the tongue every 5 (five) minutes as needed for chest pain.  25 tablet  11  . simvastatin (ZOCOR) 20 MG tablet TAKE ONE (1) TABLET EACH DAY  90 tablet  3  . tadalafil (CIALIS) 20 MG tablet Take 1 tablet (20 mg total) by mouth  daily as needed for erectile dysfunction.  10 tablet  0  . traMADol (ULTRAM) 50 MG tablet Take 1 tablet (50 mg total) by mouth every 6 (six) hours as needed.  60 tablet  0  . Zinc 100 MG TABS Take 1 tablet by mouth daily.        [name] zolpidem (AMBIEN) 10 MG tablet TAKE ONE TABLET AT BEDTIME AS NEEDED FORSLEEP  15 tablet  3   No current facility-administered medications on file prior to visit.     Review of Systems  Constitutional: Negative.   HENT: Positive for sinus pressure (ethmoid).   Eyes: Negative.   Respiratory: Negative.   All other systems reviewed and are negative.      Objective:  BP 122/70  Pulse 61  Temp(Src) 97.8 F (36.6 C) (Oral)  Resp 16  Ht 5\' 10"  (1.778 m)  Wt 211 lb 4 oz (95.822 kg)  BMI 30.31 kg/m2  SpO2 97%   Physical Exam  Nursing note and vitals reviewed. Constitutional: He is oriented to person, place, and time. He appears well-developed and well-nourished. No distress.  HENT:  Head: Normocephalic.  Right Ear: Hearing, tympanic membrane and external ear normal.  Left Ear: Hearing, tympanic membrane and external ear normal.  Mouth/Throat: Oropharynx is clear and  moist.  Turbinates appear mildly pale.  Cardiovascular: Normal rate, regular rhythm and normal heart sounds.   Pulmonary/Chest: Effort normal and breath sounds normal.  Neurological: He is alert and oriented to person, place, and time.  Skin: Skin is warm and dry.  Psychiatric: He has a normal mood and affect. His behavior is normal. Judgment and thought content normal.       Assessment & Plan:   1. Chronic ethmoidal sinusitis Continues to have ethmoidal sinus pressure despite 2 rounds of antibiotics. With previous sinus surgical history it is reasonable to refer to ENT at this time. Will also attempt prednisone taper as described below to decrease inflammation. Instructed to continue to hydrate and use saline nasal sprays.   - predniSONE (DELTASONE) 10 MG tablet; Take 60 mg (6 tablets)  on the first day then decrease by 10 mg (1 tablet) daily until done.  Dispense: 21 tablet; Refill: 0 - Ambulatory referral to ENT

## 2013-12-30 ENCOUNTER — Telehealth: Payer: Self-pay | Admitting: Internal Medicine

## 2013-12-30 NOTE — Telephone Encounter (Signed)
error 

## 2014-01-11 ENCOUNTER — Ambulatory Visit (INDEPENDENT_AMBULATORY_CARE_PROVIDER_SITE_OTHER): Payer: Commercial Managed Care - HMO | Admitting: Internal Medicine

## 2014-01-11 ENCOUNTER — Ambulatory Visit (INDEPENDENT_AMBULATORY_CARE_PROVIDER_SITE_OTHER)
Admission: RE | Admit: 2014-01-11 | Discharge: 2014-01-11 | Disposition: A | Discharge status: Home / Self Care | Payer: Commercial Managed Care - HMO | Source: Ambulatory Visit | Attending: Internal Medicine | Admitting: Internal Medicine

## 2014-01-11 ENCOUNTER — Encounter: Payer: Self-pay | Admitting: Internal Medicine

## 2014-01-11 VITALS — BP 130/72 | HR 64 | Temp 97.5°F | Resp 16 | Ht 70.0 in | Wt 210.2 lb

## 2014-01-11 DIAGNOSIS — R5383 Other fatigue: Secondary | ICD-10-CM

## 2014-01-11 DIAGNOSIS — R059 Cough, unspecified: Secondary | ICD-10-CM

## 2014-01-11 DIAGNOSIS — R05 Cough: Secondary | ICD-10-CM

## 2014-01-11 DIAGNOSIS — R5381 Other malaise: Secondary | ICD-10-CM

## 2014-01-11 DIAGNOSIS — R61 Generalized hyperhidrosis: Secondary | ICD-10-CM | POA: Insufficient documentation

## 2014-01-11 DIAGNOSIS — R5382 Chronic fatigue, unspecified: Secondary | ICD-10-CM

## 2014-01-11 DIAGNOSIS — E785 Hyperlipidemia, unspecified: Secondary | ICD-10-CM

## 2014-01-11 DIAGNOSIS — R911 Solitary pulmonary nodule: Secondary | ICD-10-CM

## 2014-01-11 LAB — CBC WITH DIFFERENTIAL/PLATELET
Basophils Absolute: 0.1 10*3/uL (ref 0.0–0.1)
Basophils Relative: 0.6 % (ref 0.0–3.0)
Eosinophils Absolute: 0.1 10*3/uL (ref 0.0–0.7)
Eosinophils Relative: 0.8 % (ref 0.0–5.0)
HCT: 46.3 % (ref 39.0–52.0)
Hemoglobin: 15.6 g/dL (ref 13.0–17.0)
Lymphocytes Relative: 21.9 % (ref 12.0–46.0)
Lymphs Abs: 2 10*3/uL (ref 0.7–4.0)
MCHC: 33.7 g/dL (ref 30.0–36.0)
MCV: 89.6 fl (ref 78.0–100.0)
Monocytes Absolute: 0.8 10*3/uL (ref 0.1–1.0)
Monocytes Relative: 9 % (ref 3.0–12.0)
Neutro Abs: 6.1 10*3/uL (ref 1.4–7.7)
Neutrophils Relative %: 67.7 % (ref 43.0–77.0)
Platelets: 278 10*3/uL (ref 150.0–400.0)
RBC: 5.17 Mil/uL (ref 4.22–5.81)
RDW: 13 % (ref 11.5–15.5)
WBC: 9 10*3/uL (ref 4.0–10.5)

## 2014-01-11 LAB — COMPREHENSIVE METABOLIC PANEL
ALT: 18 U/L (ref 0–53)
AST: 18 U/L (ref 0–37)
Albumin: 4.1 g/dL (ref 3.5–5.2)
Alkaline Phosphatase: 109 U/L (ref 39–117)
BUN: 16 mg/dL (ref 6–23)
CO2: 25 mEq/L (ref 19–32)
Calcium: 9.6 mg/dL (ref 8.4–10.5)
Chloride: 103 mEq/L (ref 96–112)
Creatinine, Ser: 1.2 mg/dL (ref 0.4–1.5)
GFR: 64.52 mL/min (ref >60.00)
Glucose, Bld: 85 mg/dL (ref 70–99)
Potassium: 4.3 mEq/L (ref 3.5–5.1)
Sodium: 137 mEq/L (ref 135–145)
Total Bilirubin: 0.5 mg/dL (ref 0.2–1.2)
Total Protein: 6.6 g/dL (ref 6.0–8.3)

## 2014-01-11 LAB — SEDIMENTATION RATE: Sed Rate: 11 mm/hr (ref 0–22)

## 2014-01-11 LAB — C-REACTIVE PROTEIN: CRP: 0.8 mg/dL (ref 0.5–20.0)

## 2014-01-11 NOTE — Progress Notes (Signed)
Pre-visit discussion using our clinic review tool. No additional management support is needed unless otherwise documented below in the visit note.  

## 2014-01-11 NOTE — Patient Instructions (Signed)
Chest x ray today at [facility]  If labs and x ray are normal, return to dr Tami Ribas for Ct of sinuses

## 2014-01-11 NOTE — Progress Notes (Signed)
Patient ID: Randall Ali., male   DOB: [birth year], 73 y.o.   MRN: [id number]   Patient Active Problem List   Diagnosis Date Noted  . Other malaise and fatigue 01/11/2014  . Night sweats 01/11/2014  . Diastasis recti 05/19/2013  . Inflammation of toe 05/19/2013  . H/O arthroplasty 05/19/2013  . Screening for prostate cancer 02/27/2012  . Routine general medical examination at a health care facility 02/27/2012  . Erectile dysfunction 02/27/2012  . HLD (hyperlipidemia)   . HTN (hypertension)   . Dysphagia 12/24/2011  . Pulmonary nodule 12/24/2011  . HYPERLIPIDEMIA-MIXED 03/12/2010  . CHEST PAIN-UNSPECIFIED 03/12/2010  . OLD MYOCARDIAL INFARCTION 03/08/2009  . CAD, NATIVE VESSEL 03/08/2009    Subjective:  CC:   Chief Complaint  Patient presents with  . Cough    dry non productive X 2 or 3 months  . Night Sweats    wake up and sheet will be completely wet.  . Fatigue    no energy does not want to do any thing     HPI:   Randall Ali. is a 73 y.o. male who presents for follow up on chonic conditions.    Hr has had a dry Cough for several months. He denies  PND but gets frequent headaches behind the eyes.  Has had no energy for several months accompanied by drenching night sweats. He was treated with prednisone taper again  without antibiotics by  Dr. Tami Ribas with no improvement  He has been inactive since his foot surgery by Dr. Debby Bud 9 weeks ago.   Had post op eval 2 weeks ago  And wound had closed but the bone was not healing.   Had several basal cell Cas taken off by [facility] 3 weeks ago. He has a  hstory of melanoma    Past Medical History  Diagnosis Date  . CAD (coronary artery disease)     s/p inferior wall infarct in 10/01. has stent in Rt coronary artery. is due a stress myoview. does have some dyspnea on exertion  . SOB (shortness of breath) on exertion   . 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 Chest pain   . HLD (hyperlipidemia)    isdue followup lipids. on zocor 10 mg/day   . HTN (hypertension)     Past Surgical History  Procedure Laterality Date  . Cardiac catheterization  06/24/11  . Carotid stent  03/10/2011  . Arm surgery  2010  . Shoulder arthroscopy  2012  . Wrist surgery  2011  . Toe surgery  1994  . Nasal sinus surgery  2008    septpolasty, bilateral turbinate reduction  . Hammer toe surgery      right toe       The following portions of the patient's history were reviewed and updated as appropriate: Allergies, current medications, and problem list.    Review of Systems:   Patient denies headache, fevers, malaise, unintentional weight loss, skin rash, eye pain, sinus congestion and sinus pain, sore throat, dysphagia,  hemoptysis , cough, dyspnea, wheezing, chest pain, palpitations, orthopnea, edema, abdominal pain, nausea, melena, diarrhea, constipation, flank pain, dysuria, hematuria, urinary  Frequency, nocturia, numbness, tingling, seizures,  Focal weakness, Loss of consciousness,  Tremor, insomnia, depression, anxiety, and suicidal ideation.     History   Social History  . Marital Status: Single    Spouse Name: N/A    Number of Children: N/A  . Years of Education: 93  Occupational History  . retired     [employer]   Social History Main Topics  . Smoking status: Former Smoker -- 2.50 packs/day for 40 years    Types: Cigarettes    Quit date: 03/24/2000  . Smokeless tobacco: Never Used  . Alcohol Use: 6.0 oz/week    10 Shots of liquor per week     Comment: occasional  . Drug Use: No  . Sexual Activity: Not on file   Other Topics Concern  . Not on file   Social History Narrative   Singled; retired, part time; gets regular exercise.    Caffeine Use-yes          Objective:  Filed Vitals:   01/11/14 1058  BP: 130/72  Pulse: 64  Temp: 97.5 F (36.4 C)  Resp: 16     General appearance: alert, cooperative and appears stated age Ears: normal TM's and external ear  canals both ears Throat: lips, mucosa, and tongue normal; teeth and gums normal Neck: no adenopathy, no carotid bruit, supple, symmetrical, trachea midline and thyroid not enlarged, symmetric, no tenderness/mass/nodules Back: symmetric, no curvature. ROM normal. No CVA tenderness. Lungs: clear to auscultation bilaterally Heart: regular rate and rhythm, S1, S2 normal, no murmur, click, rub or gallop Abdomen: soft, non-tender; bowel sounds normal; no masses,  no organomegaly Pulses: 2+ and symmetric Skin: Skin color, texture, turgor normal. No rashes or lesions Lymph nodes: Cervical, supraclavicular, and axillary nodes normal.  Assessment and Plan:  Night sweats Etiology unclear.  With a normal chest CT in Dec 2014 and normal chest x ray today,  Lymphoma unlikely.[name]  CBC w diff, ESR and CRP are all normal as well so occult infection is also unlikely.  Testosterone levels are normal as well.    Other malaise and fatigue Etiology unclear.  No signs of infection or anemia.   HLD (hyperlipidemia) Well controlled on current statin therapy.   Liver enzymes are normal , no changes today.  Lab Results  Component Value Date   CHOL 140 10/07/2013   HDL 46.70 10/07/2013   LDLCALC 86 10/07/2013   LDLDIRECT 78.0 04/05/2013   TRIG 36.0 10/07/2013   CHOLHDL 3 10/07/2013   Lab Results  Component Value Date   ALT 18 01/11/2014   AST 18 01/11/2014   ALKPHOS 109 01/11/2014   BILITOT 0.5 01/11/2014     Pulmonary nodule Unchanged by serial CTs   Updated Medication List Outpatient Encounter Prescriptions as of 01/11/2014  Medication Sig  . aspirin 81 MG EC tablet Take 81 mg by mouth daily.    . Calcium-Vitamin D (RA CALCIUM PLUS VITAMIN D) 600-125 MG-UNIT TABS Take 1 tablet by mouth daily.    . fexofenadine (ALLEGRA) 180 MG tablet Take 180 mg by mouth daily.  . metoprolol succinate (TOPROL-XL) 25 MG 24 hr tablet Take 0.5 tablets (12.5 mg total) by mouth daily.  . nitroGLYCERIN (NITROSTAT) 0.4 MG SL tablet  Place 1 tablet (0.4 mg total) under the tongue every 5 (five) minutes as needed for chest pain.  . simvastatin (ZOCOR) 20 MG tablet TAKE ONE (1) TABLET EACH DAY  . tadalafil (CIALIS) 20 MG tablet Take 1 tablet (20 mg total) by mouth daily as needed for erectile dysfunction.  . traMADol (ULTRAM) 50 MG tablet Take 1 tablet (50 mg total) by mouth every 6 (six) hours as needed.  . Zinc 100 MG TABS Take 1 tablet by mouth daily.    [name] zolpidem (AMBIEN) 10 MG tablet TAKE ONE TABLET  AT BEDTIME AS NEEDED FORSLEEP  . fluticasone (FLONASE) 50 MCG/ACT nasal spray Place 2 sprays into both nostrils daily.  . predniSONE (DELTASONE) 10 MG tablet Take 60 mg (6 tablets) on the first day then decrease by 10 mg (1 tablet) daily until done.     Orders Placed This Encounter  Procedures  . DG Chest 2 View  . Sedimentation rate  . C-reactive protein  . CBC with Differential  . T4 AND TSH  . Testosterone, free, total  . Comp Met (CMET)    No Follow-up on file.

## 2014-01-12 ENCOUNTER — Encounter: Payer: Self-pay | Admitting: *Deleted

## 2014-01-12 ENCOUNTER — Ambulatory Visit (INDEPENDENT_AMBULATORY_CARE_PROVIDER_SITE_OTHER): Payer: Commercial Managed Care - HMO | Admitting: General Surgery

## 2014-01-12 LAB — TESTOSTERONE, FREE, TOTAL, SHBG
Sex Hormone Binding: 84 nmol/L — ABNORMAL HIGH (ref 13–71)
Testosterone, Free: 75.1 pg/mL (ref 47.0–244.0)
Testosterone-% Free: 1.1 % — ABNORMAL LOW (ref 1.6–2.9)
Testosterone: 677 ng/dL (ref 300–890)

## 2014-01-12 LAB — T4 AND TSH
T4, Total: 5 ug/dL (ref 4.5–12.0)
TSH: 1.64 u[IU]/mL (ref 0.450–4.500)

## 2014-01-14 ENCOUNTER — Encounter: Payer: Self-pay | Admitting: Internal Medicine

## 2014-01-14 NOTE — Assessment & Plan Note (Signed)
Well controlled on current statin therapy.   Liver enzymes are normal , no changes today.  Lab Results  Component Value Date   CHOL 140 10/07/2013   HDL 46.70 10/07/2013   LDLCALC 86 10/07/2013   LDLDIRECT 78.0 04/05/2013   TRIG 36.0 10/07/2013   CHOLHDL 3 10/07/2013   Lab Results  Component Value Date   ALT 18 01/11/2014   AST 18 01/11/2014   ALKPHOS 109 01/11/2014   BILITOT 0.5 01/11/2014

## 2014-01-14 NOTE — Assessment & Plan Note (Signed)
Etiology unclear.  No signs of infection or anemia.

## 2014-01-14 NOTE — Assessment & Plan Note (Signed)
Unchanged by serial CTs

## 2014-01-14 NOTE — Assessment & Plan Note (Signed)
Etiology unclear.  With a normal chest CT in Dec 2014 and normal chest x ray today,  Lymphoma unlikely.[name]  CBC w diff, ESR and CRP are all normal as well so occult infection is also unlikely.  Testosterone levels are normal as well.

## 2014-02-06 ENCOUNTER — Ambulatory Visit: Payer: Self-pay | Admitting: Unknown Physician Specialty

## 2014-03-13 DIAGNOSIS — M2020 Hallux rigidus, unspecified foot: Secondary | ICD-10-CM | POA: Insufficient documentation

## 2014-04-10 ENCOUNTER — Encounter: Payer: Self-pay | Admitting: Internal Medicine

## 2014-04-10 ENCOUNTER — Ambulatory Visit (INDEPENDENT_AMBULATORY_CARE_PROVIDER_SITE_OTHER): Payer: Medicare HMO | Admitting: Internal Medicine

## 2014-04-10 VITALS — BP 140/72 | HR 64 | Temp 97.6°F | Resp 16 | Ht 70.0 in | Wt 213.5 lb

## 2014-04-10 DIAGNOSIS — R519 Headache, unspecified: Secondary | ICD-10-CM

## 2014-04-10 DIAGNOSIS — R51 Headache: Secondary | ICD-10-CM

## 2014-04-10 DIAGNOSIS — R911 Solitary pulmonary nodule: Secondary | ICD-10-CM

## 2014-04-10 DIAGNOSIS — Z1211 Encounter for screening for malignant neoplasm of colon: Secondary | ICD-10-CM

## 2014-04-10 DIAGNOSIS — I10 Essential (primary) hypertension: Secondary | ICD-10-CM

## 2014-04-10 DIAGNOSIS — I739 Peripheral vascular disease, unspecified: Secondary | ICD-10-CM

## 2014-04-10 DIAGNOSIS — E785 Hyperlipidemia, unspecified: Secondary | ICD-10-CM

## 2014-04-10 MED ORDER — TRAMADOL HCL 50 MG PO TABS: 50.0000 mg | ORAL_TABLET | Freq: Four times a day (QID) | ORAL | Status: DC | PRN

## 2014-04-10 MED ORDER — METOPROLOL SUCCINATE ER 25 MG PO TB24: 25.0000 mg | ORAL_TABLET | Freq: Every day | ORAL | Status: DC

## 2014-04-10 MED ORDER — ZOLPIDEM TARTRATE 10 MG PO TABS: ORAL_TABLET | ORAL | Status: DC

## 2014-04-10 NOTE — Progress Notes (Signed)
Patient ID: Randall Ali., male   DOB: Jul 25, 1940, 73 y.o.   MRN: [id number]  Patient Active Problem List   Diagnosis Date Noted  . PAD (peripheral artery disease) 04/11/2014  . Chronic daily headache 04/11/2014  . Other malaise and fatigue 01/11/2014  . Inflammation of toe 05/19/2013  . H/O arthroplasty 05/19/2013  . Screening for prostate cancer 02/27/2012  . Routine general medical examination at a health care facility 02/27/2012  . Erectile dysfunction 02/27/2012  . HLD (hyperlipidemia)   . HTN (hypertension)   . Dysphagia 12/24/2011  . Pulmonary nodule 12/24/2011  . HYPERLIPIDEMIA-MIXED 03/12/2010  . CHEST PAIN-UNSPECIFIED 03/12/2010  . OLD MYOCARDIAL INFARCTION 03/08/2009  . CAD, NATIVE VESSEL 03/08/2009    Subjective:  CC:   Chief Complaint  Patient presents with  . Follow-up    General follow up / C/o still having headaches. Needs refills and ? about metoprolol    HPI:   Randall Ali. is a 73 y.o. male who presents for  Follow up on chronic conditions including hypertension, hyperlipidemia,  Foot pain and headaches,  Has seen ENT and eye doctor with no source for headaches other than presbyopia,  Patient reports that he thinks his headaches are aggravated by frequent reading of smaller print. Tami Ribas did an MRI of brain that was normal.   His Foot still hurting constantly y under the first and second toes despite corrective surgery by Deorio and application of a toe pad.  States that he is unable to exercise .   Tolerating his medications without issue. No chest pain, dyspnea, or LE edema.    Past Medical History  Diagnosis Date  . CAD (coronary artery disease)     s/p inferior wall infarct in 10/01. has stent in Rt coronary artery. is due a stress myoview. does have some dyspnea on exertion  . SOB (shortness of breath) on exertion   . 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 Chest pain   . HLD (hyperlipidemia)     isdue followup  lipids. on zocor 10 mg/day   . HTN (hypertension)     Past Surgical History  Procedure Laterality Date  . Cardiac catheterization  06/24/11  . Carotid stent  03/10/2011  . Arm surgery  2010  . Shoulder arthroscopy  2012  . Wrist surgery  2011  . Toe surgery  1994  . Nasal sinus surgery  2008    septpolasty, bilateral turbinate reduction  . Hammer toe surgery      right toe       The following portions of the patient's history were reviewed and updated as appropriate: Allergies, current medications, and problem list.    Review of Systems:   Patient denies headache, fevers, malaise, unintentional weight loss, skin rash, eye pain, sinus congestion and sinus pain, sore throat, dysphagia,  hemoptysis , cough, dyspnea, wheezing, chest pain, palpitations, orthopnea, edema, abdominal pain, nausea, melena, diarrhea, constipation, flank pain, dysuria, hematuria, urinary  Frequency, nocturia, numbness, tingling, seizures,  Focal weakness, Loss of consciousness,  Tremor, insomnia, depression, anxiety, and suicidal ideation.     History   Social History  . Marital Status: Single    Spouse Name: N/A    Number of Children: N/A  . Years of Education: 12   Occupational History  . retired     [employer]   Social History Main Topics  . Smoking status: Former Smoker -- 2.50 packs/day for 40 years    Types:  Cigarettes    Quit date: 03/24/2000  . Smokeless tobacco: Never Used  . Alcohol Use: 6.0 oz/week    10 Shots of liquor per week     Comment: occasional  . Drug Use: No  . Sexual Activity: Not on file   Other Topics Concern  . Not on file   Social History Narrative   Singled; retired, part time; gets regular exercise.    Caffeine Use-yes          Objective:  Filed Vitals:   04/10/14 1609  BP: 140/72  Pulse: 64  Temp: 97.6 F (36.4 C)  Resp: 16     General appearance: alert, cooperative and appears stated age Ears: normal TM's and external ear canals both  ears Throat: lips, mucosa, and tongue normal; teeth and gums normal Neck: no adenopathy, no carotid bruit, supple, symmetrical, trachea midline and thyroid not enlarged, symmetric, no tenderness/mass/nodules Back: symmetric, no curvature. ROM normal. No CVA tenderness. Lungs: clear to auscultation bilaterally Heart: regular rate and rhythm, S1, S2 normal, no murmur, click, rub or gallop Abdomen: soft, non-tender; bowel sounds normal; no masses,  no organomegaly Pulses: 2+ and symmetric Skin: Skin color, texture, turgor normal. No rashes or lesions Lymph nodes: Cervical, supraclavicular, and axillary nodes normal.  Assessment and Plan:  PAD (peripheral artery disease) Chronic microvascular ischemic changes were noted on recent brain MRI. Will review medications to ensure patient's atherosclerosis is treated as aggressively as can be tolerated  HTN (hypertension) Well controlled on current regimen. Renal function stable, no changes today.  Lab Results  Component Value Date   CREATININE 1.2 01/11/2014   Lab Results  Component Value Date   NA 137 01/11/2014   K 4.3 01/11/2014   CL 103 01/11/2014   CO2 25 01/11/2014     HLD (hyperlipidemia) Well controlled on current statin therapy.   Liver enzymes are normal , no changes today.  Lab Results  Component Value Date   CHOL 140 10/07/2013   HDL 46.70 10/07/2013   LDLCALC 86 10/07/2013   LDLDIRECT 78.0 04/05/2013   TRIG 36.0 10/07/2013   CHOLHDL 3 10/07/2013   Lab Results  Component Value Date   ALT 18 01/11/2014   AST 18 01/11/2014   ALKPHOS 109 01/11/2014   BILITOT 0.5 01/11/2014     Chronic daily headache We discussed increasing metoprolol dose to manage chronic daily headache. MRi of brain was noncontributory, ordered by ENT  And eye exam is up to date.  If still present will consider ruling out sleep apnea and cervical spine stenosis as contributing etiologies.   Pulmonary nodule Stable over 3 years of imaging.  No  further workup.    Updated Medication List Outpatient Encounter Prescriptions as of 04/10/2014  Medication Sig  . aspirin 81 MG EC tablet Take 81 mg by mouth daily.    . Calcium-Vitamin D (RA CALCIUM PLUS VITAMIN D) 600-125 MG-UNIT TABS Take 1 tablet by mouth daily.    . metoprolol succinate (TOPROL-XL) 25 MG 24 hr tablet Take 1 tablet (25 mg total) by mouth daily.  . nitroGLYCERIN (NITROSTAT) 0.4 MG SL tablet Place 1 tablet (0.4 mg total) under the tongue every 5 (five) minutes as needed for chest pain.  . simvastatin (ZOCOR) 20 MG tablet TAKE ONE (1) TABLET EACH DAY  . tadalafil (CIALIS) 20 MG tablet Take 1 tablet (20 mg total) by mouth daily as needed for erectile dysfunction.  . traMADol (ULTRAM) 50 MG tablet Take 1 tablet (50 mg total) by  mouth every 6 (six) hours as needed.  . Zinc 100 MG TABS Take 1 tablet by mouth daily.    [name] zolpidem (AMBIEN) 10 MG tablet TAKE ONE TABLET AT BEDTIME AS NEEDED FORSLEEP  . [DISCONTINUED] metoprolol succinate (TOPROL-XL) 25 MG 24 hr tablet Take 0.5 tablets (12.5 mg total) by mouth daily.  . [DISCONTINUED] traMADol (ULTRAM) 50 MG tablet Take 1 tablet (50 mg total) by mouth every 6 (six) hours as needed.  . [DISCONTINUED] zolpidem (AMBIEN) 10 MG tablet TAKE ONE TABLET AT BEDTIME AS NEEDED FORSLEEP  . fexofenadine (ALLEGRA) 180 MG tablet Take 180 mg by mouth daily.  . fluticasone (FLONASE) 50 MCG/ACT nasal spray Place 2 sprays into both nostrils daily.  . predniSONE (DELTASONE) 10 MG tablet Take 60 mg (6 tablets) on the first day then decrease by 10 mg (1 tablet) daily until done.     Orders Placed This Encounter  Procedures  . Ambulatory referral to Gastroenterology    Return in about 6 months (around 10/09/2014).

## 2014-04-10 NOTE — Progress Notes (Signed)
Pre-visit discussion using our clinic review tool. No additional management support is needed unless otherwise documented below in the visit note.  

## 2014-04-10 NOTE — Patient Instructions (Signed)
Please try increasing your metoprolol to a full tablet daily AT BEDTIME. To see if it helps your headaches   If it makes you too tired,  We can try an alternative

## 2014-04-11 DIAGNOSIS — I70219 Atherosclerosis of native arteries of extremities with intermittent claudication, unspecified extremity: Secondary | ICD-10-CM | POA: Insufficient documentation

## 2014-04-11 DIAGNOSIS — R51 Headache: Secondary | ICD-10-CM

## 2014-04-11 DIAGNOSIS — I739 Peripheral vascular disease, unspecified: Secondary | ICD-10-CM

## 2014-04-11 DIAGNOSIS — R519 Headache, unspecified: Secondary | ICD-10-CM | POA: Insufficient documentation

## 2014-04-11 NOTE — Assessment & Plan Note (Signed)
Well controlled on current regimen. Renal function stable, no changes today.  Lab Results  Component Value Date   CREATININE 1.2 01/11/2014   Lab Results  Component Value Date   NA 137 01/11/2014   K 4.3 01/11/2014   CL 103 01/11/2014   CO2 25 01/11/2014

## 2014-04-11 NOTE — Assessment & Plan Note (Signed)
Well controlled on current statin therapy.   Liver enzymes are normal , no changes today.  Lab Results  Component Value Date   CHOL 140 10/07/2013   HDL 46.70 10/07/2013   LDLCALC 86 10/07/2013   LDLDIRECT 78.0 04/05/2013   TRIG 36.0 10/07/2013   CHOLHDL 3 10/07/2013   Lab Results  Component Value Date   ALT 18 01/11/2014   AST 18 01/11/2014   ALKPHOS 109 01/11/2014   BILITOT 0.5 01/11/2014

## 2014-04-11 NOTE — Assessment & Plan Note (Signed)
Stable over 3 years of imaging.  No further workup.

## 2014-04-11 NOTE — Assessment & Plan Note (Signed)
We discussed increasing metoprolol dose to managed chronic daily headache. MRi of brain was noncontributory, ordered by ENT  And eye exam is up to date.  If still present will consider rulling out sleep apnea and cervical spine stenosis as contributing etiologies.

## 2014-04-11 NOTE — Assessment & Plan Note (Signed)
Chronic microvascular ischemic changes were noted on recent brain MRI. Will review medications to ensure patient's atherosclerosis is treated as aggressively as can be tolerated

## 2014-04-14 ENCOUNTER — Encounter: Payer: Self-pay | Admitting: Internal Medicine

## 2014-06-06 ENCOUNTER — Ambulatory Visit (AMBULATORY_SURGERY_CENTER): Payer: Self-pay | Admitting: *Deleted

## 2014-06-06 VITALS — Ht 70.0 in | Wt 214.0 lb

## 2014-06-06 DIAGNOSIS — Z1211 Encounter for screening for malignant neoplasm of colon: Secondary | ICD-10-CM

## 2014-06-06 MED ORDER — MOVIPREP 100 G PO SOLR: 1.0000 | Freq: Once | ORAL | Status: DC

## 2014-06-06 NOTE — Progress Notes (Signed)
No egg or soy allergy. No anesthesia problems.  No home O2.  No diet meds.  

## 2014-06-07 ENCOUNTER — Telehealth: Payer: Self-pay | Admitting: *Deleted

## 2014-06-07 NOTE — Telephone Encounter (Signed)
Pt was seen in pre-visit on 06/06/14 for colonoscopy on 06/14/14 at [facility], previous colonoscopy 2010 at [facility], report was received and placed on your desk, please review Thanks-adm

## 2014-06-13 NOTE — Telephone Encounter (Signed)
Record reviewed, 5 year recall colonoscopy recommended after fair preparation Proceed to colonoscopy as scheduled tomorrow

## 2014-06-14 ENCOUNTER — Ambulatory Visit (AMBULATORY_SURGERY_CENTER): Payer: PPO | Admitting: Internal Medicine

## 2014-06-14 ENCOUNTER — Encounter: Payer: Self-pay | Admitting: Internal Medicine

## 2014-06-14 VITALS — BP 144/77 | HR 56 | Temp 97.2°F | Resp 21 | Ht 70.0 in | Wt 214.0 lb

## 2014-06-14 DIAGNOSIS — Z1211 Encounter for screening for malignant neoplasm of colon: Secondary | ICD-10-CM

## 2014-06-14 HISTORY — PX: COLONOSCOPY: SHX174

## 2014-06-14 MED ORDER — FLEET ENEMA 7-19 GM/118ML RE ENEM
1.0000 | ENEMA | Freq: Once | RECTAL | Status: AC
  Administered 2014-06-14: 1 via RECTAL

## 2014-06-14 MED ORDER — SODIUM CHLORIDE 0.9 % IV SOLN: 500.0000 mL | INTRAVENOUS | Status: DC

## 2014-06-14 NOTE — Op Note (Signed)
[place]  [address]. [place], [address]   COLONOSCOPY PROCEDURE REPORT  PATIENT: Randall Ali, Randall Ali  MR#: [id number] BIRTHDATE: Mar 20, 1941 , 73  yrs. old GENDER: male ENDOSCOPIST: Jerene Bears, MD REFERRED FS:ELTRVU Derrel Nip, M.D. PROCEDURE DATE:  06/14/2014 PROCEDURE:   Colonoscopy, screening First Screening Colonoscopy - Avg.  risk and is 50 yrs.  old or older - No.  Prior Negative Screening - Now for repeat screening. Less than 10 yrs Prior Negative Screening - Now for repeat screening.  Inadequate prep  History of Adenoma - Now for follow-up colonoscopy & has been > or = to 3 yrs.  N/A  Polyps Removed Today? No.  Polyps Removed Today? No.  Recommend repeat exam, <10 yrs? Polyps Removed Today? No.  Recommend repeat exam, <10 yrs? No. ASA CLASS:   Class II INDICATIONS:average risk for colon cancer and last colonoscopy completed  years ago. MEDICATIONS: Monitored anesthesia care and Propofol 240 mg IV  DESCRIPTION OF PROCEDURE:   After the risks benefits and alternatives of the procedure were thoroughly explained, informed consent was obtained.  The digital rectal exam revealed no abnormalities of the rectum.   The LB [id number]  endoscope was introduced through the anus and advanced to the cecum, which was identified by both the appendix and ileocecal valve. No adverse events experienced.   The quality of the prep was fair, using MoviPrep  The instrument was then slowly withdrawn as the colon was fully examined.    COLON FINDINGS: A normal appearing cecum, ileocecal valve, and appendiceal orifice were identified.  The ascending, transverse, descending, sigmoid colon, and rectum appeared unremarkable. Retroflexed views revealed no abnormalities. The time to cecum=4 minutes 00 seconds.  Withdrawal time=14 minutes 32 seconds.  The scope was withdrawn and the procedure completed.  COMPLICATIONS: There were no immediate complications.  ENDOSCOPIC  IMPRESSION: Normal colonoscopy  RECOMMENDATIONS: You should continue to follow colorectal cancer screening guidelines for "routine risk" patients with a repeat colonoscopy in 10 years. There is no need for FOBT (stool) testing for at least 5 years. Given fair preparation consider Cologuard in 5  years.  eSigned:  Jerene Bears, MD 06/14/2014 9:05 AM   cc: The Patient and Deborra Medina, MD

## 2014-06-14 NOTE — Patient Instructions (Signed)

## 2014-06-14 NOTE — Progress Notes (Signed)
Pt states bowel movement was not clear, still brown. Fleets enema was given with good result, no formed stool, light yellow in color.

## 2014-06-14 NOTE — Progress Notes (Signed)
A/ox3, pleased with MAC, report to RN 

## 2014-06-15 ENCOUNTER — Telehealth: Payer: Self-pay | Admitting: *Deleted

## 2014-06-15 NOTE — Telephone Encounter (Signed)
  Follow up Call-  Call back number 06/14/2014  Post procedure Call Back phone  # [phone]  Permission to leave phone message Yes     Patient questions:  Do you have a fever, pain , or abdominal swelling? No. Pain Score  0 *  Have you tolerated food without any problems? Yes.    Have you been able to return to your normal activities? Yes.    Do you have any questions about your discharge instructions: Diet   No. Medications  No. Follow up visit  No.  Do you have questions or concerns about your Care? No.  Actions: * If pain score is 4 or above: No action needed, pain <4.

## 2014-06-16 ENCOUNTER — Encounter: Payer: Self-pay | Admitting: Internal Medicine

## 2014-06-25 LAB — HM COLONOSCOPY: HM Colonoscopy: NORMAL

## 2014-06-28 ENCOUNTER — Telehealth: Payer: Self-pay | Admitting: Internal Medicine

## 2014-06-28 NOTE — Telephone Encounter (Signed)
Advised MD verbally patient wishes MD advised patient needs to contact surgeon and have surgeon contact insurance.

## 2014-06-28 NOTE — Telephone Encounter (Signed)
Left message to call office

## 2014-06-28 NOTE — Telephone Encounter (Signed)
The patient is needing the physician to call his insurance company to inform them that it is medically necessary for them to perform additional surgery on his leg. [facility] (Dr.Deorio) performed the surgery last year ,but the patient has changed insurances to [place] is out of network and Dr. Debby Bud has scheduled the patient for surgery due to his leg not healing and he can hardly walk per the patient.

## 2014-07-07 ENCOUNTER — Encounter: Payer: PPO | Admitting: Internal Medicine

## 2014-07-18 ENCOUNTER — Ambulatory Visit (AMBULATORY_SURGERY_CENTER): Payer: Self-pay

## 2014-07-18 VITALS — Ht 70.0 in | Wt 210.0 lb

## 2014-07-18 DIAGNOSIS — R1314 Dysphagia, pharyngoesophageal phase: Secondary | ICD-10-CM

## 2014-07-18 NOTE — Progress Notes (Signed)
No allergies to eggs or soy No home oxygen No diet/weight loss meds No past problems with anesthesia  No internet

## 2014-07-27 ENCOUNTER — Ambulatory Visit (AMBULATORY_SURGERY_CENTER): Payer: PPO | Admitting: Internal Medicine

## 2014-07-27 ENCOUNTER — Encounter: Payer: Self-pay | Admitting: Internal Medicine

## 2014-07-27 VITALS — BP 126/64 | HR 50 | Temp 95.7°F | Resp 21 | Ht 64.0 in | Wt 212.0 lb

## 2014-07-27 DIAGNOSIS — R1310 Dysphagia, unspecified: Secondary | ICD-10-CM

## 2014-07-27 DIAGNOSIS — R1314 Dysphagia, pharyngoesophageal phase: Secondary | ICD-10-CM

## 2014-07-27 DIAGNOSIS — K209 Esophagitis, unspecified without bleeding: Secondary | ICD-10-CM

## 2014-07-27 DIAGNOSIS — R1319 Other dysphagia: Secondary | ICD-10-CM

## 2014-07-27 MED ORDER — SODIUM CHLORIDE 0.9 % IV SOLN: 500.0000 mL | INTRAVENOUS | Status: DC

## 2014-07-27 MED ORDER — PANTOPRAZOLE SODIUM 40 MG PO TBEC: 40.0000 mg | DELAYED_RELEASE_TABLET | Freq: Every day | ORAL | Status: DC

## 2014-07-27 NOTE — Progress Notes (Signed)
Patient awakening,vss,report to rn 

## 2014-07-27 NOTE — Op Note (Signed)
[place]  [address]. [place], [address]   ENDOSCOPY PROCEDURE REPORT  PATIENT: Randall Ali, Randall Ali  MR#: [id number] BIRTHDATE: June 09, 1941 , 73  yrs. old GENDER: male ENDOSCOPIST: Jerene Bears, MD PROCEDURE DATE:  07/27/2014 PROCEDURE:  EGD, diagnostic ASA CLASS:     Class III INDICATIONS:  dysphagia. MEDICATIONS: Monitored anesthesia care and Propofol 150 mg IV TOPICAL ANESTHETIC: none  DESCRIPTION OF PROCEDURE: After the risks benefits and alternatives of the procedure were thoroughly explained, informed consent was obtained.  The LB [id number] endoscope was introduced through the mouth and advanced to the second portion of the duodenum , Without limitations.  The instrument was slowly withdrawn as the mucosa was fully examined.   ESOPHAGUS: Moderately severe reflux esophagitis with esophageal ulceration was found at the gastroesophageal junction.  No definite stricture or mass was seen.  STOMACH: The mucosa of the stomach appeared normal.  DUODENUM: The duodenal mucosa showed no abnormalities in the bulb and 2nd part of the duodenum.  Retroflexed views revealed GE junction ulceration/inflammation as previously described.  The scope was then withdrawn from the patient and the procedure completed.  COMPLICATIONS: There were no immediate complications.  ENDOSCOPIC IMPRESSION: 1.   Reflux esophagitis at the gastroesophageal junction which is likely cause of dysphagia 2.   The mucosa of the stomach appeared normal 3.   The duodenal mucosa showed no abnormalities in the bulb and 2nd part of the duodenum  RECOMMENDATIONS: 1.  Begin pantoprazole 40 mg daily to control acid reflux given esophagitis seen today.  This should be taken 30 minutes to 1 hour before the 1st meal of the day 2.  Office follow-up in 8-12 weeks to ensure improvement in symptoms    eSigned:  Jerene Bears, MD 07/27/2014 10:44 AM CC:The Patient and Deborra Medina,  MD

## 2014-07-27 NOTE — Patient Instructions (Signed)
YOU HAD AN ENDOSCOPIC PROCEDURE TODAY AT [facility]: Refer to the procedure report that was given to you for any specific questions about what was found during the examination.  If the procedure report does not answer your questions, please call your gastroenterologist to clarify.  If you requested that your care partner not be given the details of your procedure findings, then the procedure report has been included in a sealed envelope for you to review at your convenience later.  YOU SHOULD EXPECT: Some feelings of bloating in the abdomen. Passage of more gas than usual.  Walking can help get rid of the air that was put into your GI tract during the procedure and reduce the bloating  DIET: Your first meal following the procedure should be a light meal and then it is ok to progress to your normal diet.  A half-sandwich or bowl of soup is an example of a good first meal.  Heavy or fried foods are harder to digest and may make you feel nauseous or bloated.  Likewise meals heavy in dairy and vegetables can cause extra gas to form and this can also increase the bloating.  Drink plenty of fluids but you should avoid alcoholic beverages for 24 hours.  ACTIVITY: Your care partner should take you home directly after the procedure.  You should plan to take it easy, moving slowly for the rest of the day.  You can resume normal activity the day after the procedure however you should NOT DRIVE or use heavy machinery for 24 hours (because of the sedation medicines used during the test).    SYMPTOMS TO REPORT IMMEDIATELY: A gastroenterologist can be reached at any hour.  During normal business hours, 8:30 AM to 5:00 PM Monday through Friday, call [phone].  After hours and on weekends, please call the GI answering service at [phone] who will take a message and have the physician on call contact you.   Following upper endoscopy (EGD)  Vomiting of blood or coffee ground material  New  chest pain or pain under the shoulder blades  Painful or persistently difficult swallowing  New shortness of breath  Fever of 100F or higher  Black, tarry-looking stools  FOLLOW UP: Our staff will call the home number listed on your records the next business day following your procedure to check on you and address any questions or concerns that you may have at that time regarding the information given to you following your procedure. This is a courtesy call and so if there is no answer at the home number and we have not heard from you through the emergency physician on call, we will assume that you have returned to your regular daily activities without incident.  SIGNATURES/CONFIDENTIALITY: You and/or your care partner have signed paperwork which will be entered into your electronic medical record.  These signatures attest to the fact that that the information above on your After Visit Summary has been reviewed and is understood.  Full responsibility of the confidentiality of this discharge information lies with you and/or your care-partner.  Please call office in the next day or two to set up a follow up appointment in 8 to 12 weeks to  Continue your normal medications- your prescription was sent to your pharmacy  Please take Pantoprazole 40 mg 1 tablet 30 to 60 minutes before your first meal of the day  You might note some irritation in your nose or some drainage.  This may cause feelings  of congestion.  This is from the oxygen, which can be irritating.  There is no need for concern, this should clear up in a day or so.

## 2014-07-28 ENCOUNTER — Telehealth: Payer: Self-pay

## 2014-07-28 NOTE — Telephone Encounter (Signed)
  Follow up Call-  Call back number 07/27/2014 06/14/2014  Post procedure Call Back phone  # [phone]  Permission to leave phone message Yes Yes     Patient questions:  Do you have a fever, pain , or abdominal swelling? No. Pain Score  0 *  Have you tolerated food without any problems? Yes.    Have you been able to return to your normal activities? Yes.    Do you have any questions about your discharge instructions: Diet   No. Medications  No. Follow up visit  No.  Do you have questions or concerns about your Care? No.  Actions: * If pain score is 4 or above: No action needed, pain <4.

## 2014-08-08 ENCOUNTER — Telehealth: Payer: Self-pay

## 2014-08-08 ENCOUNTER — Ambulatory Visit: Payer: PPO | Admitting: Cardiovascular Disease

## 2014-08-08 DIAGNOSIS — R079 Chest pain, unspecified: Secondary | ICD-10-CM

## 2014-08-08 MED ORDER — NITROGLYCERIN 0.4 MG SL SUBL: 0.4000 mg | SUBLINGUAL_TABLET | SUBLINGUAL | Status: DC | PRN

## 2014-08-08 NOTE — Telephone Encounter (Signed)
Spoke w/ pt.  He reports chest pain w/ exertion. Describes at tightness across his chest. Denies SOB, n/v or diaphoresis. Reports that his sx presented while he was on the treadmill (which he states he does not do often) and resolve w/ rest.  Reports h/o MI and this is "not the same".  Sent refill in for pt's nitro, as this has expired.  He would like to keep appt on 08/11/14 (pt's appt for today was r/s due to [name] not feeling well), as he does not feel that his sx are emergent.  Advised him to call 911 if his sx worsen, continue, or are not relieved by nitro. He verbalizes understanding and will call back w/ any further questions or concerns.

## 2014-08-08 NOTE — Telephone Encounter (Signed)
Pt states he has chest pain when he "starts doing anything" states it just hurts a little bit. Pt had to be r/s from today's schedule. He does have an appt on 3/4

## 2014-08-11 ENCOUNTER — Ambulatory Visit (INDEPENDENT_AMBULATORY_CARE_PROVIDER_SITE_OTHER): Payer: PPO | Admitting: Cardiovascular Disease

## 2014-08-11 ENCOUNTER — Encounter: Payer: Self-pay | Admitting: Cardiovascular Disease

## 2014-08-11 VITALS — BP 140/78 | HR 70 | Ht 70.0 in | Wt 214.0 lb

## 2014-08-11 DIAGNOSIS — I2510 Atherosclerotic heart disease of native coronary artery without angina pectoris: Secondary | ICD-10-CM

## 2014-08-11 DIAGNOSIS — R079 Chest pain, unspecified: Secondary | ICD-10-CM

## 2014-08-11 DIAGNOSIS — I209 Angina pectoris, unspecified: Secondary | ICD-10-CM

## 2014-08-11 DIAGNOSIS — I10 Essential (primary) hypertension: Secondary | ICD-10-CM

## 2014-08-11 DIAGNOSIS — E785 Hyperlipidemia, unspecified: Secondary | ICD-10-CM

## 2014-08-11 NOTE — Assessment & Plan Note (Signed)
Stable angina symptoms over the past month. We'll order pharmacologic Myoview as he is unable to treadmill

## 2014-08-11 NOTE — Assessment & Plan Note (Signed)
LDL slightly above goal. He has follow-up with primary care in April. Recommended weight loss, continued exercise.

## 2014-08-11 NOTE — Progress Notes (Signed)
Patient ID: Randall Ali., male    DOB: 11-16-1940, 74 y.o.   MRN: [id number]  HPI Comments: Mr. 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 Previous admit in 2011 for for mild chest pain and sweating. Stress test showed no ischemia with inferior scar. Previous cardiac catheterization showing moderate OM disease.   In follow-up today, he reports having worsening exertional chest pain over the past month. Symptoms will last for 3-5 minutes until he slows down his activity then resolves. Pain radiates across the left side of his chest, deep. On previous visits he had left flank pain, he reports this is different. He is otherwise been trying to exercise on a regular basis. He has noticed his new chest pain with groceries, biking, walking. Very occasionally will have it at rest but it is much lighter.  Also continues to have problems with his foot, nonhealing. Scheduled to undergo surgery mid March 2016 for repeat surgery Still unable to walk for a far secondary to his foot that is not healing.  EKG on today's visit shows normal sinus rhythm with rate 70 bpm, no significant ST or T-wave changes  He reports his blood pressure has been well-controlled on the metoprolol only.  Prior lipid panel was at goal  Other past medical history   In 2013, he was having worsening chest pain medically with exertion.  cardiac catheterization that showed moderate obtuse marginal disease, otherwise no significant stenosis. This was discussed with the interventional physicians in [facility] and recommendation was made for medical management.   previous Total cholesterol 140, LDL 78 prior EGD  that showed an ulcer. Previously had trouble swallowing.    No Known Allergies  Outpatient Encounter Prescriptions as of 08/11/2014   Medication Sig  . aspirin 81 MG EC tablet Take 81 mg by mouth daily.    . Calcium-Vitamin D (RA CALCIUM PLUS VITAMIN D) 600-125 MG-UNIT TABS Take 1 tablet by mouth daily.    . Cyanocobalamin (RA VITAMIN B-12 TR) 1000 MCG TBCR Take by mouth.  . metoprolol succinate (TOPROL-XL) 25 MG 24 hr tablet Take 1 tablet (25 mg total) by mouth daily.  . nitroGLYCERIN (NITROSTAT) 0.4 MG SL tablet Place 1 tablet (0.4 mg total) under the tongue every 5 (five) minutes as needed for chest pain.  . pantoprazole (PROTONIX) 40 MG tablet Take 1 tablet (40 mg total) by mouth daily before breakfast.  . simvastatin (ZOCOR) 20 MG tablet TAKE ONE (1) TABLET EACH DAY  . tadalafil (CIALIS) 20 MG tablet Take 1 tablet (20 mg total) by mouth daily as needed for erectile dysfunction.  . traMADol (ULTRAM) 50 MG tablet Take 1 tablet (50 mg total) by mouth every 6 (six) hours as needed.  . vitamin C (ASCORBIC ACID) 500 MG tablet Take by mouth.  . vitamin E 400 UNIT capsule Take by mouth.  . Zinc 100 MG TABS Take 1 tablet by mouth daily.    [name] zolpidem (AMBIEN) 10 MG tablet TAKE ONE TABLET AT BEDTIME AS NEEDED FORSLEEP  . [DISCONTINUED] fexofenadine (ALLEGRA) 180 MG tablet Take 180 mg by mouth daily.  . [DISCONTINUED] fluticasone (FLONASE) 50 MCG/ACT nasal spray Place 2 sprays into both nostrils daily. (Patient not taking: Reported on 08/11/2014)    Past Medical History  Diagnosis Date  . CAD (coronary artery disease)  s/p inferior wall infarct in 10/01. has stent in Rt coronary artery. is due a stress myoview. does have some dyspnea on exertion  . SOB (shortness of breath) on exertion   . 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 Chest pain   . HLD (hyperlipidemia)     isdue followup lipids. on zocor 10 mg/day   . HTN (hypertension)   . Allergy     seasonal  . Cataract     removed  . Cancer     skin    Past Surgical History  Procedure Laterality Date  . Cardiac catheterization  06/24/11  . Carotid  stent  03/10/2011  . Arm surgery  2010  . Shoulder arthroscopy  2012  . Wrist surgery  2011  . Toe surgery  1994  . Nasal sinus surgery  2008    septpolasty, bilateral turbinate reduction  . Hammer toe surgery      right toe  . Colonoscopy  2010    Social History  reports that he quit smoking about 14 years ago. His smoking use included Cigarettes. He has a 100 pack-year smoking history. He has never used smokeless tobacco. He reports that he drinks about 6.0 oz of alcohol per week. He reports that he does not use illicit drugs.  Family History family history includes Cancer in his paternal grandfather; Diabetes in his father; Heart disease in his mother; Heart disease (age of onset: 72) in his brother; Hypertension in his father and mother. There is no history of Colon cancer. Review of Systems  Constitutional: Negative.   Respiratory: Negative.   Cardiovascular: Positive for chest pain.  Gastrointestinal: Negative.   Musculoskeletal: Negative.   Skin: Negative.   Neurological: Negative.   Hematological: Negative.   Psychiatric/Behavioral: Negative.   All other systems reviewed and are negative.  BP 140/78 mmHg  Pulse 70  Ht 5\' 10"  (1.778 m)  Wt 214 lb (97.07 kg)  BMI 30.71 kg/m2  Physical Exam  Constitutional: He is oriented to person, place, and time. He appears well-developed and well-nourished.  HENT:  Head: Normocephalic.  Nose: Nose normal.  Mouth/Throat: Oropharynx is clear and moist.  Eyes: Conjunctivae are normal. Pupils are equal, round, and reactive to light.  Neck: Normal range of motion. Neck supple. No JVD present.  Cardiovascular: Normal rate, regular rhythm, S1 normal, S2 normal, normal heart sounds and intact distal pulses.  Exam reveals no gallop and no friction rub.   No murmur heard. Pulmonary/Chest: Effort normal and breath sounds normal. No respiratory distress. He has no wheezes. He has no rales. He exhibits no tenderness.  Abdominal: Soft. Bowel  sounds are normal. He exhibits no distension. There is no tenderness.  Musculoskeletal: Normal range of motion. He exhibits no edema or tenderness.  Lymphadenopathy:    He has no cervical adenopathy.  Neurological: He is alert and oriented to person, place, and time. Coordination normal.  Skin: Skin is warm and dry. No rash noted. No erythema.  Psychiatric: He has a normal mood and affect. His behavior is normal. Judgment and thought content normal.      Assessment and Plan   Nursing note and vitals reviewed.

## 2014-08-11 NOTE — Assessment & Plan Note (Signed)
Blood pressure is well controlled on today's visit. No changes made to the medications. 

## 2014-08-11 NOTE — Patient Instructions (Addendum)
You are doing well. No medication changes were made.  We will schedule a lexiscan myoview for chest pain, angina  Please call us if you have new issues that need to be addressed before your next appt.  Your physician wants you to follow-up in: 6 months.  You will receive a reminder letter in the mail two months in advance. If you don't receive a letter, please call our office to schedule the follow-up appointment.  [place]  Your caregiver has ordered a Stress Test with nuclear imaging. The purpose of this test is to evaluate the blood supply to your heart muscle. This procedure is referred to as a "Non-Invasive Stress Test." This is because other than having an IV started in your vein, nothing is inserted or "invades" your body. Cardiac stress tests are done to find areas of poor blood flow to the heart by determining the extent of coronary artery disease (CAD). Some patients exercise on a treadmill, which naturally increases the blood flow to your heart, while others who are  unable to walk on a treadmill due to physical limitations have a pharmacologic/chemical stress agent called Lexiscan . This medicine will mimic walking on a treadmill by temporarily increasing your coronary blood flow.   Please note: these test may take anywhere between 2-4 hours to complete  PLEASE REPORT TO [facility] AT THE FIRST DESK WILL DIRECT YOU WHERE TO GO  Date of Procedure:___Tuesday, Feb 8___  Arrival Time for Procedure:____7:15 am___  Instructions regarding medication:   __X__:  Hold betablocker(s) night before procedure and morning of procedure:  METOPROLOL  PLEASE NOTIFY THE OFFICE AT LEAST 24 HOURS IN ADVANCE IF YOU ARE UNABLE TO KEEP YOUR APPOINTMENT.  [phone] AND  PLEASE NOTIFY NUCLEAR MEDICINE AT [facility] AT LEAST 24 HOURS IN ADVANCE IF YOU ARE UNABLE TO KEEP YOUR APPOINTMENT. [phone]  How to prepare for your Myoview test:  1. Do not eat or drink  after midnight 2. No caffeine for 24 hours prior to test 3. No smoking 24 hours prior to test. 4. Your medication may be taken with water.  If your doctor stopped a medication because of this test, do not take that medication. 5. Ladies, please do not wear dresses.  Skirts or pants are appropriate. Please wear a short sleeve shirt. 6. No perfume, cologne or lotion.

## 2014-08-11 NOTE — Assessment & Plan Note (Signed)
Prior stent to the RCA, also with previous moderate OM disease in 2013. Now with chest pain symptoms with exertion concerning for unstable angina. Stress test has been ordered. He is unable to treadmill

## 2014-08-12 ENCOUNTER — Emergency Department: Payer: Self-pay | Admitting: Emergency Medicine

## 2014-08-15 ENCOUNTER — Ambulatory Visit: Payer: Self-pay | Admitting: Cardiovascular Disease

## 2014-08-16 ENCOUNTER — Other Ambulatory Visit: Payer: Self-pay

## 2014-08-16 DIAGNOSIS — R079 Chest pain, unspecified: Secondary | ICD-10-CM

## 2014-08-24 LAB — HM COLONOSCOPY: HM Colonoscopy: NORMAL

## 2014-09-07 ENCOUNTER — Encounter: Payer: Self-pay | Admitting: *Deleted

## 2014-09-26 ENCOUNTER — Ambulatory Visit: Payer: PPO | Admitting: Internal Medicine

## 2014-10-04 ENCOUNTER — Encounter: Payer: Self-pay | Admitting: Internal Medicine

## 2014-10-04 ENCOUNTER — Ambulatory Visit (INDEPENDENT_AMBULATORY_CARE_PROVIDER_SITE_OTHER): Payer: PPO | Admitting: Internal Medicine

## 2014-10-04 VITALS — BP 126/76 | HR 68 | Ht 68.75 in | Wt 214.1 lb

## 2014-10-04 DIAGNOSIS — K2100 Gastro-esophageal reflux disease with esophagitis, without bleeding: Secondary | ICD-10-CM

## 2014-10-04 DIAGNOSIS — R1314 Dysphagia, pharyngoesophageal phase: Secondary | ICD-10-CM | POA: Diagnosis not present

## 2014-10-04 DIAGNOSIS — R1310 Dysphagia, unspecified: Secondary | ICD-10-CM

## 2014-10-04 DIAGNOSIS — R1319 Other dysphagia: Secondary | ICD-10-CM

## 2014-10-04 DIAGNOSIS — R400 Somnolence: Secondary | ICD-10-CM

## 2014-10-04 MED ORDER — OMEPRAZOLE 40 MG PO CPDR: 40.0000 mg | DELAYED_RELEASE_CAPSULE | Freq: Every day | ORAL | Status: DC

## 2014-10-04 NOTE — Patient Instructions (Signed)
We have sent the following medications to your pharmacy for you to pick up at your convenience: Omeprazole 40 mg daily (in place of pantoprazole)  You have been scheduled for a Barium Esophogram at [facility] Radiology (1st floor of the hospital) on Thursday, 11/02/2014 at 9:30 am . Please arrive 15 minutes prior to your appointment for registration. Make certain not to have anything to eat or drink 6 hours prior to your test. If you need to reschedule for any reason, please contact radiology at [phone] to do so. __________________________________________________________________ A barium swallow is an examination that concentrates on views of the esophagus. This tends to be a double contrast exam (barium and two liquids which, when combined, create a gas to distend the wall of the oesophagus) or single contrast (non-ionic iodine based). The study is usually tailored to your symptoms so a good history is essential. Attention is paid during the study to the form, structure and configuration of the esophagus, looking for functional disorders (such as aspiration, dysphagia, achalasia, motility and reflux) EXAMINATION You may be asked to change into a gown, depending on the type of swallow being performed. A radiologist and radiographer will perform the procedure. The radiologist will advise you of the type of contrast selected for your procedure and direct you during the exam. You will be asked to stand, sit or lie in several different positions and to hold a small amount of fluid in your mouth before being asked to swallow while the imaging is performed .In some instances you may be asked to swallow barium coated marshmallows to assess the motility of a solid food bolus. The exam can be recorded as a digital or video fluoroscopy procedure. POST PROCEDURE It will take 1-2 days for the barium to pass through your system. To facilitate this, it is important, unless otherwise directed, to increase your  fluids for the next 24-48hrs and to resume your normal diet.  This test typically takes about 30 minutes to perform. __________________________________________________________________________________

## 2014-10-04 NOTE — Progress Notes (Signed)
Patient ID: Randall Ali., male   DOB: [birth year], 74 y.o.   MRN: [id number] HPI: [name] is a 74 year old male with a past medical history of CAD status post PCI, hyperlipidemia, hypertension, tobacco abuse in remission who is seen in follow-up after he reported dysphagia at the time of his screening colonoscopy. EGD was performed on 07/27/2014. This revealed moderately severe reflux esophagitis with esophageal ulceration at the GE junction. No definitive stricture or mass was seen. He was started on pantoprazole 40 mg daily and returns for follow-up. He reports he is never in troubled with heartburn. He does have solid food dysphagia which is intermittent. Sometimes this occurs with pills as well. Never with liquids. No change with using pantoprazole 40 mg twice daily as a relates to solid food dysphagia. No odynophagia. No nausea or vomiting. Very good appetite. No early satiety. No weight loss. No change in bowel habit, diarrhea or constipation. Colonoscopy which was reviewed was performed on 06/14/2014. This was normal.  He has been seen recent by cardiology and was reporting some left-sided chest pain that seem to be worse with exertion. Stress test was performed and he reports this was normal. He's not had any further chest pain and wonders if this was musculoskeletal. He has noticed increased sleepiness in the past month and inquires if this could be related to pantoprazole  Past Medical History  Diagnosis Date  . CAD (coronary artery disease)     s/p inferior wall infarct in 10/01. has stent in Rt coronary artery. is due a stress myoview. does have some dyspnea on exertion  . SOB (shortness of breath) on exertion   . 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 Chest pain   . HLD (hyperlipidemia)     isdue followup lipids. on zocor 10 mg/day   . HTN (hypertension)   . Allergy     seasonal  . Cataract     removed  . Cancer     skin  . Reflux esophagitis      Past Surgical History  Procedure Laterality Date  . Cardiac catheterization  06/24/11  . Carotid stent  03/10/2011  . Arm surgery  2010  . Shoulder arthroscopy  2012  . Wrist surgery  2011  . Toe surgery  1994  . Nasal sinus surgery  2008    septpolasty, bilateral turbinate reduction  . Hammer toe surgery      right toe  . Colonoscopy  2010    Outpatient Prescriptions Prior to Visit  Medication Sig Dispense Refill  . aspirin 81 MG EC tablet Take 81 mg by mouth daily.      . Calcium-Vitamin D (RA CALCIUM PLUS VITAMIN D) 600-125 MG-UNIT TABS Take 1 tablet by mouth daily.      . Cyanocobalamin (RA VITAMIN B-12 TR) 1000 MCG TBCR Take by mouth.    . metoprolol succinate (TOPROL-XL) 25 MG 24 hr tablet Take 1 tablet (25 mg total) by mouth daily. 90 tablet 1  . nitroGLYCERIN (NITROSTAT) 0.4 MG SL tablet Place 1 tablet (0.4 mg total) under the tongue every 5 (five) minutes as needed for chest pain. 25 tablet 11  . simvastatin (ZOCOR) 20 MG tablet TAKE ONE (1) TABLET EACH DAY 90 tablet 3  . tadalafil (CIALIS) 20 MG tablet Take 1 tablet (20 mg total) by mouth daily as needed for erectile dysfunction. 10 tablet 0  . traMADol (ULTRAM) 50 MG tablet Take 1 tablet (50 mg total) by  mouth every 6 (six) hours as needed. 60 tablet 0  . vitamin C (ASCORBIC ACID) 500 MG tablet Take by mouth.    . vitamin E 400 UNIT capsule Take by mouth.    . Zinc 100 MG TABS Take 1 tablet by mouth daily.      [name] zolpidem (AMBIEN) 10 MG tablet TAKE ONE TABLET AT BEDTIME AS NEEDED FORSLEEP 15 tablet 3  . pantoprazole (PROTONIX) 40 MG tablet Take 1 tablet (40 mg total) by mouth daily before breakfast. 90 tablet 3   No facility-administered medications prior to visit.    No Known Allergies  Family History  Problem Relation Age of Onset  . Hypertension Mother   . Heart disease Mother   . Hypertension Father   . Diabetes Father   . Cancer Paternal Grandfather   . Heart disease Brother 59  . Colon cancer Neg Hx      History  Substance Use Topics  . Smoking status: Former Smoker -- 2.50 packs/day for 40 years    Types: Cigarettes    Quit date: 03/24/2000  . Smokeless tobacco: Never Used  . Alcohol Use: 6.0 oz/week    10 Shots of liquor per week     Comment: occasional    ROS: As per history of present illness, otherwise negative  BP 126/76 mmHg  Pulse 68  Ht 5' 8.75" (1.746 m)  Wt 214 lb 2 oz (97.126 kg)  BMI 31.86 kg/m2 Constitutional: Well-developed and well-nourished. No distress. HEENT: Normocephalic and atraumatic. Oropharynx is clear and moist. No oropharyngeal exudate. Conjunctivae are normal.  No scleral icterus. Neck: Neck supple. Trachea midline. Cardiovascular: Normal rate, regular rhythm and intact distal pulses. No M/R/G Pulmonary/chest: Effort normal and breath sounds normal. No wheezing, rales or rhonchi. Abdominal: Soft, nontender, nondistended. Bowel sounds active throughout. There are no masses palpable. No hepatosplenomegaly. Extremities: no clubbing, cyanosis, or edema Neurological: Alert and oriented to person place and time. Psychiatric: Normal mood and affect. Behavior is normal.  RELEVANT LABS AND IMAGING: CBC    Component Value Date/Time   WBC 9.0 01/11/2014 1125   WBC 6.9 06/13/2011 1426   RBC 5.17 01/11/2014 1125   RBC 5.21 06/13/2011 1426   HGB 15.6 01/11/2014 1125   HCT 46.3 01/11/2014 1125   PLT 278.0 01/11/2014 1125   MCV 89.6 01/11/2014 1125   MCH 31.1 06/13/2011 1426   MCHC 33.7 01/11/2014 1125   MCHC 34.5 06/13/2011 1426   RDW 13.0 01/11/2014 1125   RDW 13.1 06/13/2011 1426   LYMPHSABS 2.0 01/11/2014 1125   LYMPHSABS 1.9 06/13/2011 1426   MONOABS 0.8 01/11/2014 1125   EOSABS 0.1 01/11/2014 1125   EOSABS 0.1 06/13/2011 1426   BASOSABS 0.1 01/11/2014 1125   BASOSABS 0.0 06/13/2011 1426    CMP     Component Value Date/Time   NA 137 01/11/2014 1125   NA 138 06/13/2011 1426   K 4.3 01/11/2014 1125   CL 103 01/11/2014 1125   CO2 25  01/11/2014 1125   GLUCOSE 85 01/11/2014 1125   GLUCOSE 112* 06/13/2011 1426   BUN 16 01/11/2014 1125   BUN 14 06/13/2011 1426   CREATININE 1.2 01/11/2014 1125   CALCIUM 9.6 01/11/2014 1125   PROT 6.6 01/11/2014 1125   ALBUMIN 4.1 01/11/2014 1125   AST 18 01/11/2014 1125   ALT 18 01/11/2014 1125   ALKPHOS 109 01/11/2014 1125   BILITOT 0.5 01/11/2014 1125   GFRNONAA 86 06/13/2011 1426   GFRAA 99 06/13/2011 1426  ASSESSMENT/PLAN: 74 year old male with a past medical history of CAD status post PCI, hyperlipidemia, hypertension, tobacco abuse in remission who is seen in follow-up after he reported dysphagia at the time of his screening colonoscopy.  1. Reflux esophagitis with ulceration/solid food dysphagia -- no heartburn symptom, his complaint was solid food dysphagia. This was felt to be secondary to esophagitis and ulceration but has not improved with 2 months of PPI therapy. Doubtful the pantoprazole leads to sleepiness but will change to omeprazole 40 mg daily just to be sure. I would like him to complete 1 additional month of PPI therapy and if solid food dysphagia persists, we'll proceed to barium esophagram with tablet.  While the ulceration did not appear malignant, if symptoms persist, repeat upper endoscopy is recommended. We discussed this at length and he understands the plan and is comfortable with it.  2. Sleepiness -- some fatigue, see #1. I recommended CBC to ensure he has not become anemic. He has a primary care annual physical coming up on 10/25/2014 and labs will be done at this appointment.  3. CRC screening -- no polyps a colonoscopy earlier this year. Preparation was fair. Cologuard recommended at age 82.  67 min spent with pt, >50% in counseling of the above issues   EB:RAXENM [address], [address], [place] [id number]

## 2014-10-13 ENCOUNTER — Other Ambulatory Visit: Payer: Self-pay | Admitting: Cardiovascular Disease

## 2014-10-25 ENCOUNTER — Encounter: Payer: PPO | Admitting: Internal Medicine

## 2014-11-01 ENCOUNTER — Ambulatory Visit (HOSPITAL_COMMUNITY): Payer: PPO

## 2014-11-02 ENCOUNTER — Ambulatory Visit (HOSPITAL_COMMUNITY): Payer: PPO

## 2014-11-02 ENCOUNTER — Encounter: Payer: Self-pay | Admitting: Internal Medicine

## 2014-11-02 ENCOUNTER — Ambulatory Visit
Admission: RE | Admit: 2014-11-02 | Discharge: 2014-11-02 | Disposition: A | Discharge status: Home / Self Care | Payer: PPO | Source: Ambulatory Visit | Attending: Internal Medicine | Admitting: Internal Medicine

## 2014-11-02 ENCOUNTER — Ambulatory Visit (INDEPENDENT_AMBULATORY_CARE_PROVIDER_SITE_OTHER): Payer: PPO | Admitting: Internal Medicine

## 2014-11-02 VITALS — BP 138/78 | HR 69 | Temp 98.5°F | Resp 16 | Ht 68.75 in | Wt 210.2 lb

## 2014-11-02 DIAGNOSIS — R1011 Right upper quadrant pain: Secondary | ICD-10-CM | POA: Diagnosis not present

## 2014-11-02 DIAGNOSIS — I2510 Atherosclerotic heart disease of native coronary artery without angina pectoris: Secondary | ICD-10-CM | POA: Insufficient documentation

## 2014-11-02 DIAGNOSIS — R5381 Other malaise: Secondary | ICD-10-CM

## 2014-11-02 DIAGNOSIS — R0600 Dyspnea, unspecified: Secondary | ICD-10-CM

## 2014-11-02 DIAGNOSIS — F17200 Nicotine dependence, unspecified, uncomplicated: Secondary | ICD-10-CM | POA: Insufficient documentation

## 2014-11-02 DIAGNOSIS — R5382 Chronic fatigue, unspecified: Secondary | ICD-10-CM

## 2014-11-02 DIAGNOSIS — Z1159 Encounter for screening for other viral diseases: Secondary | ICD-10-CM

## 2014-11-02 DIAGNOSIS — I209 Angina pectoris, unspecified: Secondary | ICD-10-CM

## 2014-11-02 DIAGNOSIS — R61 Generalized hyperhidrosis: Secondary | ICD-10-CM | POA: Insufficient documentation

## 2014-11-02 DIAGNOSIS — J449 Chronic obstructive pulmonary disease, unspecified: Secondary | ICD-10-CM | POA: Diagnosis not present

## 2014-11-02 DIAGNOSIS — R5383 Other fatigue: Secondary | ICD-10-CM | POA: Insufficient documentation

## 2014-11-02 LAB — CBC WITH DIFFERENTIAL/PLATELET
Basophils Absolute: 0 10*3/uL (ref 0.0–0.1)
Basophils Relative: 0.4 % (ref 0.0–3.0)
Eosinophils Absolute: 0.2 10*3/uL (ref 0.0–0.7)
Eosinophils Relative: 1.8 % (ref 0.0–5.0)
HCT: 45.2 % (ref 39.0–52.0)
Hemoglobin: 15.3 g/dL (ref 13.0–17.0)
Lymphocytes Relative: 18.9 % (ref 12.0–46.0)
Lymphs Abs: 1.8 10*3/uL (ref 0.7–4.0)
MCHC: 33.9 g/dL (ref 30.0–36.0)
MCV: 90.5 fl (ref 78.0–100.0)
Monocytes Absolute: 1.2 10*3/uL — ABNORMAL HIGH (ref 0.1–1.0)
Monocytes Relative: 13.2 % — ABNORMAL HIGH (ref 3.0–12.0)
Neutro Abs: 6.2 10*3/uL (ref 1.4–7.7)
Neutrophils Relative %: 65.7 % (ref 43.0–77.0)
Platelets: 260 10*3/uL (ref 150.0–400.0)
RBC: 4.99 Mil/uL (ref 4.22–5.81)
RDW: 12.6 % (ref 11.5–15.5)
WBC: 9.4 10*3/uL (ref 4.0–10.5)

## 2014-11-02 LAB — COMPREHENSIVE METABOLIC PANEL
ALT: 16 U/L (ref 0–53)
AST: 15 U/L (ref 0–37)
Albumin: 4.2 g/dL (ref 3.5–5.2)
Alkaline Phosphatase: 65 U/L (ref 39–117)
BUN: 14 mg/dL (ref 6–23)
CO2: 29 mEq/L (ref 19–32)
Calcium: 9.7 mg/dL (ref 8.4–10.5)
Chloride: 100 mEq/L (ref 96–112)
Creatinine, Ser: 1.07 mg/dL (ref 0.40–1.50)
GFR: 72.07 mL/min (ref >60.00)
Glucose, Bld: 101 mg/dL — ABNORMAL HIGH (ref 70–99)
Potassium: 4.7 mEq/L (ref 3.5–5.1)
Sodium: 135 mEq/L (ref 135–145)
Total Bilirubin: 0.5 mg/dL (ref 0.2–1.2)
Total Protein: 7.4 g/dL (ref 6.0–8.3)

## 2014-11-02 LAB — VITAMIN B12: Vitamin B-12: 811 pg/mL (ref 211–911)

## 2014-11-02 LAB — TSH: TSH: 1.21 u[IU]/mL (ref 0.35–4.50)

## 2014-11-02 NOTE — Assessment & Plan Note (Signed)
Consider CAD, lymphoma, medication effect (Omeprazole , metoprolol) ,  Chest x ray, US abdomen, labs,  And refer back to Dr [name] if noncardiac workup is unrevealing. Just had EGD./colonosocopy so carcinoid less likely in this setting without additional symptoms.

## 2014-11-02 NOTE — Assessment & Plan Note (Signed)
Considering his history of snoring, tobacco abuse and CAD,  Need to rule out OSA,  nocturnal desats from COPD,  COPD, CAD, and anemia/thyroid.

## 2014-11-02 NOTE — Patient Instructions (Addendum)
We will call you with the lab results, and the appointments for the ultrasound  Please take a nitroglycerin tablet the next time you have chest pain to see if it helps

## 2014-11-02 NOTE — Progress Notes (Signed)
Subjective:  Patient ID: Randall Kung., male    DOB: [birth year]  Age: 74 y.o. MRN: [id number]  CC: The primary encounter diagnosis was Angina pectoris. Diagnoses of Ischemic chest pain, Chronic fatigue and malaise, Dyspnea, RUQ pain, Need for hepatitis C screening test, Unexplained night sweats, and Other fatigue were also pertinent to this visit.  HPI Randall Ali. presents for evaluation of night sweats and general malaise. "I just don't feel good." Reports daytime fatigue limiting his activities.  n  Denies pain (except for chest tightness with exertion) , denies itching. No trouble sleeping. Feels like he had a good nights sleep most nights but does snore.  X tobacco quit 10 years ago  No sleep study or PFTS,  Had normal stress test in March done to evaluate dyspnea and chest pain with exertion, which is still occurring.  Has not tried taking SL NTG and has not called cardiology .  He is a difficult historian.  New onset night sweats which recurred about 3-4 weeks ago after resolving spontaneously last year .  [name] Recent medications changes include a change in PPI to omeprazole 40 mg 4 weeks ago by Dr. Hilarie Fredrickson for dysphagia secondary to esophagitis.  No change in symptoms with PPI change.  Still sleepy during the day. Last year the nights sweats resolved for unknown reason,  Was treated for osteomyelitis of foot in 2014, but there was no history of osteomyelitis last year or this year  based on my review of surgical notes from his multiple orthopedists at [facility] from 2015.  He has lost 4 bs inthe last month  Unintentional. He had foot surgery last Thursday on the right great toe by Deorio at [facility].   all hardware removed and 2 screws  put in large toe, and a wire in the second toe to straighten it.      Outpatient Prescriptions Prior to Visit  Medication Sig Dispense Refill  . aspirin 81 MG EC tablet Take 81 mg by mouth daily.      . Calcium-Vitamin D (RA CALCIUM PLUS VITAMIN D)  600-125 MG-UNIT TABS Take 1 tablet by mouth daily.      . Cyanocobalamin (RA VITAMIN B-12 TR) 1000 MCG TBCR Take by mouth.    . metoprolol succinate (TOPROL-XL) 25 MG 24 hr tablet Take 1 tablet (25 mg total) by mouth daily. 90 tablet 1  . nitroGLYCERIN (NITROSTAT) 0.4 MG SL tablet Place 1 tablet (0.4 mg total) under the tongue every 5 (five) minutes as needed for chest pain. 25 tablet 11  . omeprazole (PRILOSEC) 40 MG capsule Take 1 capsule (40 mg total) by mouth daily. 30 capsule 2  . simvastatin (ZOCOR) 20 MG tablet TAKE ONE (1) TABLET EACH DAY 90 tablet 3  . tadalafil (CIALIS) 20 MG tablet Take 1 tablet (20 mg total) by mouth daily as needed for erectile dysfunction. 10 tablet 0  . traMADol (ULTRAM) 50 MG tablet Take 1 tablet (50 mg total) by mouth every 6 (six) hours as needed. 60 tablet 0  . vitamin C (ASCORBIC ACID) 500 MG tablet Take by mouth.    . vitamin E 400 UNIT capsule Take by mouth.    . Zinc 100 MG TABS Take 1 tablet by mouth daily.      [name] zolpidem (AMBIEN) 10 MG tablet TAKE ONE TABLET AT BEDTIME AS NEEDED FORSLEEP 15 tablet 3   No facility-administered medications prior to visit.    Review of Systems;  Patient denies  headache, fevers, malaise, unintentional weight loss, skin rash, eye pain, sinus congestion and sinus pain, sore throat, dysphagia,  hemoptysis , cough, dyspnea, wheezing, chest pain, palpitations, orthopnea, edema, abdominal pain, nausea, melena, diarrhea, constipation, flank pain, dysuria, hematuria, urinary  Frequency, nocturia, numbness, tingling, seizures,  Focal weakness, Loss of consciousness,  Tremor, insomnia, depression, anxiety, and suicidal ideation.      Objective:  BP 138/78 mmHg  Pulse 69  Temp(Src) 98.5 F (36.9 C) (Oral)  Resp 16  Ht 5' 8.75" (1.746 m)  Wt 210 lb 4 oz (95.369 kg)  BMI 31.28 kg/m2  SpO2 92%  BP Readings from Last 3 Encounters:  11/02/14 138/78  10/04/14 126/76  08/11/14 140/78    Wt Readings from Last 3 Encounters:    11/02/14 210 lb 4 oz (95.369 kg)  10/04/14 214 lb 2 oz (97.126 kg)  08/11/14 214 lb (97.07 kg)    General appearance: alert, cooperative and appears stated age Ears: normal TM's and external ear canals both ears Throat: lips, mucosa, and tongue normal; teeth and gums normal Neck: no adenopathy, no carotid bruit, supple, symmetrical, trachea midline and thyroid not enlarged, symmetric, no tenderness/mass/nodules Back: symmetric, no curvature. ROM normal. No CVA tenderness. Lungs: clear to auscultation bilaterally Heart: regular rate and rhythm, S1, S2 normal, no murmur, click, rub or gallop Abdomen: soft, non-tender; bowel sounds normal; no masses,  no organomegaly Pulses: 2+ and symmetric Skin: Skin color, texture, turgor normal. No rashes or lesions Lymph nodes: Cervical, supraclavicular, and axillary nodes normal.   No results found.  Assessment & Plan:   Problem List Items Addressed This Visit    RESOLVED: Chest pain   Chronic fatigue and malaise   Relevant Orders   Vitamin B12   CBC with Differential/Platelet   Folate RBC   TSH   Comprehensive metabolic panel   Angina pectoris - Primary    He has recurrent angina symptoms with light activity and had a non revealing stress test in March.  Given his history of CAD and prior MI will recommend cardiac CT or invasive testing      Relevant Orders   DG Chest 2 View   Unexplained night sweats    Consider CAD, lymphoma, medication effect (Omeprazole , metoprolol) ,  Chest x ray, US abdomen, labs,  And refer back to Dr [name] if noncardiac workup is unrevealing. Just had EGD./colonosocopy so carcinoid less likely in this setting without additional symptoms.        Fatigue    Considering his history of snoring, tobacco abuse and CAD,  Need to rule out OSA,  nocturnal desats from COPD,  COPD, CAD, and anemia/thyroid.       Other Visit Diagnoses    Dyspnea        RUQ pain        Relevant Orders    US Abdomen Complete     Need for hepatitis C screening test        Relevant Orders    Hepatitis C antibody     A total of 40 minutes was spent with patient more than half of which was spent in counseling patient on the above mentioned issues , reviewing and explaining recent labs and imaging studies done, and coordination of care.  I am having Mr. Dishman maintain his aspirin, Calcium-Vitamin D, Zinc, tadalafil, metoprolol succinate, traMADol, zolpidem, vitamin C, Cyanocobalamin, vitamin E, nitroGLYCERIN, omeprazole, and simvastatin.  No orders of the defined types were placed in this encounter.  There are no discontinued medications.  Follow-up: Return in about 4 weeks (around 11/30/2014).   [name], MD

## 2014-11-02 NOTE — Assessment & Plan Note (Signed)
He has recurrent angina symptoms with light activity and had a non revealing stress test in March.  Given his history of CAD and prior MI will recommend cardiac CT or invasive testing

## 2014-11-03 LAB — HEPATITIS C ANTIBODY: HCV Ab: NEGATIVE

## 2014-11-03 LAB — FOLATE RBC: RBC Folate: 779 ng/mL (ref >280)

## 2014-11-07 NOTE — Progress Notes (Signed)
Pts barium swallow moved up to 11/10/14'@10am'$ . Pt aware.

## 2014-11-09 ENCOUNTER — Telehealth: Payer: Self-pay | Admitting: Internal Medicine

## 2014-11-10 ENCOUNTER — Ambulatory Visit (HOSPITAL_COMMUNITY)
Admission: RE | Admit: 2014-11-10 | Discharge: 2014-11-10 | Disposition: A | Discharge status: Home / Self Care | Payer: PPO | Source: Ambulatory Visit | Attending: Internal Medicine | Admitting: Internal Medicine

## 2014-11-10 DIAGNOSIS — K2100 Gastro-esophageal reflux disease with esophagitis, without bleeding: Secondary | ICD-10-CM

## 2014-11-10 DIAGNOSIS — K219 Gastro-esophageal reflux disease without esophagitis: Secondary | ICD-10-CM | POA: Insufficient documentation

## 2014-11-10 DIAGNOSIS — K224 Dyskinesia of esophagus: Secondary | ICD-10-CM | POA: Insufficient documentation

## 2014-11-10 DIAGNOSIS — R1310 Dysphagia, unspecified: Secondary | ICD-10-CM | POA: Diagnosis present

## 2014-11-10 DIAGNOSIS — K449 Diaphragmatic hernia without obstruction or gangrene: Secondary | ICD-10-CM | POA: Insufficient documentation

## 2014-11-13 ENCOUNTER — Ambulatory Visit (HOSPITAL_COMMUNITY): Payer: PPO

## 2014-11-14 ENCOUNTER — Ambulatory Visit
Admission: RE | Admit: 2014-11-14 | Discharge: 2014-11-14 | Disposition: A | Discharge status: Home / Self Care | Payer: PPO | Source: Ambulatory Visit | Attending: Internal Medicine | Admitting: Internal Medicine

## 2014-11-14 DIAGNOSIS — K7689 Other specified diseases of liver: Secondary | ICD-10-CM | POA: Insufficient documentation

## 2014-11-14 DIAGNOSIS — R1011 Right upper quadrant pain: Secondary | ICD-10-CM | POA: Diagnosis not present

## 2014-11-21 ENCOUNTER — Telehealth: Payer: Self-pay | Admitting: *Deleted

## 2014-11-21 NOTE — Telephone Encounter (Signed)
Pt calling stating that PCP told him to get in contact with [place] for he did an ultra sound,  And they " found something" Please call patient.

## 2014-11-21 NOTE — Telephone Encounter (Signed)
Spoke w/ pt.  He reports that he had an u/s last week at PCP's office and they advised him to contact our office, "because they found something wrong w/ my heart".  He states that he does not know the results or the extent of the findings.  Offered pt appt on Fri 11/24/14 w/ [name], but he has CPE at the same time w/ Dr. Derrel Nip.  Advised him that [name] does not have another opening until the end of July, so to keep his appt w/ PCP. Advised him to discuss the results further w/ PCP and get a complete explanation of the results and have Dr. Derrel Nip contact [name] w/ any concerns.  He verbalizes understanding and is agreeable.  Asked him to call back if we can be of further assistance.

## 2014-11-24 ENCOUNTER — Ambulatory Visit (INDEPENDENT_AMBULATORY_CARE_PROVIDER_SITE_OTHER): Payer: PPO | Admitting: Internal Medicine

## 2014-11-24 ENCOUNTER — Encounter: Payer: Self-pay | Admitting: Internal Medicine

## 2014-11-24 VITALS — BP 138/82 | HR 59 | Temp 97.7°F | Resp 14 | Ht 69.0 in | Wt 207.5 lb

## 2014-11-24 DIAGNOSIS — I10 Essential (primary) hypertension: Secondary | ICD-10-CM | POA: Diagnosis not present

## 2014-11-24 DIAGNOSIS — I2510 Atherosclerotic heart disease of native coronary artery without angina pectoris: Secondary | ICD-10-CM

## 2014-11-24 DIAGNOSIS — R61 Generalized hyperhidrosis: Secondary | ICD-10-CM | POA: Diagnosis not present

## 2014-11-24 DIAGNOSIS — Z125 Encounter for screening for malignant neoplasm of prostate: Secondary | ICD-10-CM

## 2014-11-24 DIAGNOSIS — K760 Fatty (change of) liver, not elsewhere classified: Secondary | ICD-10-CM | POA: Diagnosis not present

## 2014-11-24 DIAGNOSIS — Z0000 Encounter for general adult medical examination without abnormal findings: Secondary | ICD-10-CM

## 2014-11-24 DIAGNOSIS — R5382 Chronic fatigue, unspecified: Secondary | ICD-10-CM

## 2014-11-24 DIAGNOSIS — R519 Headache, unspecified: Secondary | ICD-10-CM

## 2014-11-24 DIAGNOSIS — R5381 Other malaise: Secondary | ICD-10-CM

## 2014-11-24 DIAGNOSIS — R51 Headache: Secondary | ICD-10-CM

## 2014-11-24 DIAGNOSIS — R0683 Snoring: Secondary | ICD-10-CM

## 2014-11-24 LAB — LIPID PANEL
Cholesterol: 144 mg/dL (ref 0–200)
HDL: 46.9 mg/dL (ref >39.00)
LDL Cholesterol: 83 mg/dL (ref 0–99)
NonHDL: 97.1
Total CHOL/HDL Ratio: 3
Triglycerides: 71 mg/dL (ref 0.0–149.0)
VLDL: 14.2 mg/dL (ref 0.0–40.0)

## 2014-11-24 LAB — COMPREHENSIVE METABOLIC PANEL
ALT: 16 U/L (ref 0–53)
AST: 15 U/L (ref 0–37)
Albumin: 4.5 g/dL (ref 3.5–5.2)
Alkaline Phosphatase: 73 U/L (ref 39–117)
BUN: 21 mg/dL (ref 6–23)
CO2: 27 mEq/L (ref 19–32)
Calcium: 10 mg/dL (ref 8.4–10.5)
Chloride: 104 mEq/L (ref 96–112)
Creatinine, Ser: 0.99 mg/dL (ref 0.40–1.50)
GFR: 78.82 mL/min (ref >60.00)
Glucose, Bld: 86 mg/dL (ref 70–99)
Potassium: 4.8 mEq/L (ref 3.5–5.1)
Sodium: 137 mEq/L (ref 135–145)
Total Bilirubin: 0.9 mg/dL (ref 0.2–1.2)
Total Protein: 6.7 g/dL (ref 6.0–8.3)

## 2014-11-24 LAB — TSH: TSH: 1.06 u[IU]/mL (ref 0.35–4.50)

## 2014-11-24 LAB — PSA, MEDICARE: PSA: 0.28 ng/ml (ref 0.10–4.00)

## 2014-11-24 LAB — SEDIMENTATION RATE: Sed Rate: 10 mm/hr (ref 0–22)

## 2014-11-24 MED ORDER — TRAMADOL HCL 50 MG PO TABS: 50.0000 mg | ORAL_TABLET | Freq: Four times a day (QID) | ORAL | Status: DC | PRN

## 2014-11-24 NOTE — Progress Notes (Signed)
Pre-visit discussion using our clinic review tool. No additional management support is needed unless otherwise documented below in the visit note.  

## 2014-11-24 NOTE — Progress Notes (Signed)
Patient ID: Randall Beza., male    DOB: 09-Aug-1940  Age: 74 y.o. MRN: [id number]  The patient is here for annual Medicare wellness examination and management of other chronic and acute problems.   The risk factors are reflected in the social history.  The roster of all physicians providing medical care to patient - is listed in the Snapshot section of the chart.  Activities of daily living:  The patient is 100% independent in all ADLs: dressing, toileting, feeding as well as independent mobility  Home safety : The patient has smoke detectors in the home. They wear seatbelts.  There are no firearms at home. There is no violence in the home.   There is no risks for hepatitis, STDs or HIV. There is no   history of blood transfusion. They have no travel history to infectious disease endemic areas of the world.  The patient has seen their dentist in the last six month. They have seen their eye doctor in the last year. They admit to slight hearing difficulty with regard to whispered voices and some television programs.  They have deferred audiologic testing in the last year.  They do not  have excessive sun exposure. Discussed the need for sun protection: hats, long sleeves and use of sunscreen if there is significant sun exposure.   Diet: the importance of a healthy diet is discussed. They do have a healthy diet.  The benefits of regular aerobic exercise were discussed. She walks 4 times per week ,  20 minutes.   Depression screen: there are no signs or vegative symptoms of depression- irritability, change in appetite, anhedonia, sadness/tearfullness.  Cognitive assessment: the patient manages all their financial and personal affairs and is actively engaged. They could relate day,date,year and events; recalled 2/3 objects at 3 minutes; performed clock-face test normally.  The following portions of the patient's history were reviewed and updated as appropriate: allergies, current medications,  past family history, past medical history,  past surgical history, past social history  and problem list.  Visual acuity was not assessed per patient preference since she has regular follow up with her ophthalmologist. Hearing and body mass index were assessed and reviewed.   During the course of the visit the patient was educated and counseled about appropriate screening and preventive services including : fall prevention , diabetes screening, nutrition counseling, colorectal cancer screening, and recommended immunizations.    CC: The primary encounter diagnosis was Medicare annual wellness visit, subsequent. Diagnoses of Hepatic steatosis, Chronic fatigue and malaise, Chronic fatigue, Atherosclerosis of native coronary artery of native heart without angina pectoris, Essential hypertension, Unexplained night sweats, Snoring, Chronic daily headache, and Prostate cancer screening were also pertinent to this visit.  He continues to have night sears almostfdaily for  2 months.  Has a sweat at night  From the waist up and t shirt is completely drenched. Tried giving up caffeine,  No change.  No  daytime sweats .   Saw Deriori on Wednesday for red swollen foot,  worse than usual .  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   He has past surgical history that includes Cardiac catheterization (06/24/11); Carotid stent (03/10/2011); arm surgery (2010); Shoulder arthroscopy (2012); Wrist surgery (2011); Toe Surgery (1994); Nasal sinus surgery (2008); Hammer toe surgery; and Colonoscopy (2010).  His family history includes Cancer in his paternal grandfather; Diabetes in his father; Heart disease in his mother; Heart disease (age of onset: 39) in his brother; Hypertension  in his father and mother. There is no history of Colon cancer.He reports that he quit smoking about 14 years ago. His smoking use included Cigarettes. He has a 100 pack-year smoking history. He has never used smokeless tobacco. He reports that he drinks about 6.0 oz of alcohol per week. He reports that he does not use illicit drugs.  Outpatient Prescriptions Prior to Visit  Medication Sig Dispense Refill  . aspirin 81 MG EC tablet Take 81 mg by mouth daily.      . Calcium-Vitamin D (RA CALCIUM PLUS VITAMIN D) 600-125 MG-UNIT TABS Take 1 tablet by mouth daily.      . Cyanocobalamin (RA VITAMIN B-12 TR) 1000 MCG TBCR Take by mouth.    . metoprolol succinate (TOPROL-XL) 25 MG 24 hr tablet Take 1 tablet (25 mg total) by mouth daily. 90 tablet 1  . nitroGLYCERIN (NITROSTAT) 0.4 MG SL tablet Place 1 tablet (0.4 mg total) under the tongue every 5 (five) minutes as needed for chest pain. 25 tablet 11  . omeprazole (PRILOSEC) 40 MG capsule Take 1 capsule (40 mg total) by mouth daily. 30 capsule 2  . simvastatin (ZOCOR) 20 MG tablet TAKE ONE (1) TABLET EACH DAY 90 tablet 3  . tadalafil (CIALIS) 20 MG tablet Take 1 tablet (20 mg total) by mouth daily as needed for erectile dysfunction. 10 tablet 0  . vitamin C (ASCORBIC ACID) 500 MG tablet Take by mouth.    . vitamin E 400 UNIT capsule Take by mouth.    . Zinc 100 MG TABS Take 1 tablet by mouth daily.      [name] zolpidem (AMBIEN) 10 MG tablet TAKE ONE TABLET AT BEDTIME AS NEEDED FORSLEEP 15 tablet 3  . traMADol (ULTRAM) 50 MG tablet Take 1 tablet (50 mg total) by mouth every 6 (six) hours as needed. 60 tablet 0   No facility-administered medications prior to visit.    Review of Systems   Patient denies headache, unintentional weight loss, skin rash, eye pain, sinus congestion and sinus pain, sore throat, dysphagia,  hemoptysis , cough, dyspnea, wheezing, chest pain, palpitations, orthopnea, edema, abdominal pain, nausea, melena, diarrhea, constipation, flank  pain, dysuria, hematuria, urinary  Frequency, nocturia, numbness, tingling, seizures,  Focal weakness, Loss of consciousness,  Tremor, insomnia, depression, anxiety, and suicidal ideation.      Objective:  BP 138/82 mmHg  Pulse 59  Temp(Src) 97.7 F (36.5 C) (Oral)  Resp 14  Ht '5\' 9"'$  (1.753 m)  Wt 207 lb 8 oz (94.121 kg)  BMI 30.63 kg/m2  SpO2 92%  Physical Exam  General appearance: alert, cooperative and appears stated age Ears: normal TM's and external ear canals both ears Throat: lips, mucosa, and tongue normal; teeth and gums normal Neck: no adenopathy, no carotid bruit, supple, symmetrical, trachea midline and thyroid not enlarged, symmetric, no tenderness/mass/nodules Back: symmetric, no curvature. ROM normal. No CVA tenderness. Lungs: clear to auscultation bilaterally Heart: regular rate and rhythm, S1, S2 normal, no murmur, click, rub or gallop Abdomen: soft, non-tender; bowel sounds normal; no masses,  no organomegaly Pulses: 2+ and symmetric Skin: Skin color, texture, turgor normal. No rashes or lesions Lymph nodes: Cervical, supraclavicular, and axillary nodes normal.   Assessment & Plan:   Problem List Items Addressed This Visit    Medicare annual wellness visit, subsequent -  Primary    Annual Medicare wellness  exam was done as well as a comprehensive physical exam and management of acute and chronic conditions .  During the course of the visit the patient was educated and counseled about appropriate screening and preventive services including : fall prevention , diabetes screening, nutrition counseling, colorectal cancer screening, and recommended immunizations.  Printed recommendations for health maintenance screenings was given.       Chronic fatigue and malaise    Workup thus far negative,  Suspect OSA> sleep study ordered.       Relevant Orders   Nocturnal polysomnography (NPSG)   Chronic daily headache    accompanied by chronic fatigue, night sweats and  snoring.  Sleep study ordered.      Relevant Medications   traMADol (ULTRAM) 50 MG tablet   Other Relevant Orders   Nocturnal polysomnography (NPSG)   RESOLVED: Fatigue   Relevant Orders   Comprehensive metabolic panel (Completed)   TSH (Completed)   Testosterone, Free, Total, SHBG (Completed)   Hepatic steatosis    Suggested by ultrasound of abdomen done to work up report of recurrent night sweats.  Diagnosis discussed with patient and referral to Liver Clinic advised.       Relevant Orders   Ambulatory referral to Gastroenterology   Unexplained night sweats   Relevant Orders   Sedimentation rate (Completed)   Testosterone, Free, Total, SHBG (Completed)   Nocturnal polysomnography (NPSG)   CAD, NATIVE VESSEL   Relevant Orders   Lipid panel (Completed)   HTN (hypertension)   Relevant Orders   Nocturnal polysomnography (NPSG)    Other Visit Diagnoses    Snoring        Relevant Orders    Nocturnal polysomnography (NPSG)    Prostate cancer screening        Relevant Orders    PSA, Medicare (Completed)       I am having Randall Ali maintain his aspirin, Calcium-Vitamin D, Zinc, tadalafil, metoprolol succinate, zolpidem, vitamin C, Cyanocobalamin, vitamin E, nitroGLYCERIN, omeprazole, simvastatin, doxycycline, and traMADol.  Meds ordered this encounter  Medications  . doxycycline (VIBRA-TABS) 100 MG tablet    Sig: Take 1 tablet by mouth 2 (two) times daily.  . traMADol (ULTRAM) 50 MG tablet    Sig: Take 1 tablet (50 mg total) by mouth every 6 (six) hours as needed.    Dispense:  60 tablet    Refill:  5    Medications Discontinued During This Encounter  Medication Reason  . traMADol (ULTRAM) 50 MG tablet Reorder    Follow-up: No Follow-up on file.   [name], MD

## 2014-11-24 NOTE — Patient Instructions (Signed)
Sleep study has been ordered   referral to [facility] for fatty liver evaluation is in process  Health Maintenance A healthy lifestyle and preventative care can promote health and wellness.  Maintain regular health, dental, and eye exams.  Eat a healthy diet. Foods like vegetables, fruits, whole grains, low-fat dairy products, and lean protein foods contain the nutrients you need and are low in calories. Decrease your intake of foods high in solid fats, added sugars, and salt. Get information about a proper diet from your health care provider, if necessary.  Regular physical exercise is one of the most important things you can do for your health. Most adults should get at least 150 minutes of moderate-intensity exercise (any activity that increases your heart rate and causes you to sweat) each week. In addition, most adults need muscle-strengthening exercises on 2 or more days a week.   Maintain a healthy weight. The body mass index (BMI) is a screening tool to identify possible weight problems. It provides an estimate of body fat based on height and weight. Your health care provider can find your BMI and can help you achieve or maintain a healthy weight. For males 20 years and older:  A BMI below 18.5 is considered underweight.  A BMI of 18.5 to 24.9 is normal.  A BMI of 25 to 29.9 is considered overweight.  A BMI of 30 and above is considered obese.  Maintain normal blood lipids and cholesterol by exercising and minimizing your intake of saturated fat. Eat a balanced diet with plenty of fruits and vegetables. Blood tests for lipids and cholesterol should begin at age 65 and be repeated every 5 years. If your lipid or cholesterol levels are high, you are over age 38, or you are at high risk for heart disease, you may need your cholesterol levels checked more frequently.Ongoing high lipid and cholesterol levels should be treated with medicines if diet and exercise are not working.  If  you smoke, find out from your health care provider how to quit. If you do not use tobacco, do not start.  Lung cancer screening is recommended for adults aged 34-80 years who are at high risk for developing lung cancer because of a history of smoking. A yearly low-dose CT scan of the lungs is recommended for people who have at least a 30-pack-year history of smoking and are current smokers or have quit within the past 15 years. A pack year of smoking is smoking an average of 1 pack of cigarettes a day for 1 year (for example, a 30-pack-year history of smoking could mean smoking 1 pack a day for 30 years or 2 packs a day for 15 years). Yearly screening should continue until the smoker has stopped smoking for at least 15 years. Yearly screening should be stopped for people who develop a health problem that would prevent them from having lung cancer treatment.  If you choose to drink alcohol, do not have more than 2 drinks per day. One drink is considered to be 12 oz (360 mL) of beer, 5 oz (150 mL) of wine, or 1.5 oz (45 mL) of liquor.  Avoid the use of street drugs. Do not share needles with anyone. Ask for help if you need support or instructions about stopping the use of drugs.  High blood pressure causes heart disease and increases the risk of stroke. Blood pressure should be checked at least every 1-2 years. Ongoing high blood pressure should be treated with medicines if weight  loss and exercise are not effective.  If you are 18-28 years old, ask your health care provider if you should take aspirin to prevent heart disease.  Diabetes screening involves taking a blood sample to check your fasting blood sugar level. This should be done once every 3 years after age 37 if you are at a normal weight and without risk factors for diabetes. Testing should be considered at a younger age or be carried out more frequently if you are overweight and have at least 1 risk factor for diabetes.  Colorectal cancer can  be detected and often prevented. Most routine colorectal cancer screening begins at the age of 84 and continues through age 51. However, your health care provider may recommend screening at an earlier age if you have risk factors for colon cancer. On a yearly basis, your health care provider may provide home test kits to check for hidden blood in the stool. A small camera at the end of a tube may be used to directly examine the colon (sigmoidoscopy or colonoscopy) to detect the earliest forms of colorectal cancer. Talk to your health care provider about this at age 29 when routine screening begins. A direct exam of the colon should be repeated every 5-10 years through age 33, unless early forms of precancerous polyps or small growths are found.  People who are at an increased risk for hepatitis B should be screened for this virus. You are considered at high risk for hepatitis B if:  You were born in a country where hepatitis B occurs often. Talk with your health care provider about which countries are considered high risk.  Your parents were born in a high-risk country and you have not received a shot to protect against hepatitis B (hepatitis B vaccine).  You have HIV or AIDS.  You use needles to inject street drugs.  You live with, or have sex with, someone who has hepatitis B.  You are a man who has sex with other men (MSM).  You get hemodialysis treatment.  You take certain medicines for conditions like cancer, organ transplantation, and autoimmune conditions.  Hepatitis C blood testing is recommended for all people born from [birth year] through 1964 and any individual with known risk factors for hepatitis C.  Healthy men should no longer receive prostate-specific antigen (PSA) blood tests as part of routine cancer screening. Talk to your health care provider about prostate cancer screening.  Testicular cancer screening is not recommended for adolescents or adult males who have no symptoms.  Screening includes self-exam, a health care provider exam, and other screening tests. Consult with your health care provider about any symptoms you have or any concerns you have about testicular cancer.  Practice safe sex. Use condoms and avoid high-risk sexual practices to reduce the spread of sexually transmitted infections (STIs).  You should be screened for STIs, including gonorrhea and chlamydia if:  You are sexually active and are younger than 24 years.  You are older than 24 years, and your health care provider tells you that you are at risk for this type of infection.  Your sexual activity has changed since you were last screened, and you are at an increased risk for chlamydia or gonorrhea. Ask your health care provider if you are at risk.  If you are at risk of being infected with HIV, it is recommended that you take a prescription medicine daily to prevent HIV infection. This is called pre-exposure prophylaxis (PrEP). You are considered at risk if:  You are a man who has sex with other men (MSM).  You are a heterosexual man who is sexually active with multiple partners.  You take drugs by injection.  You are sexually active with a partner who has HIV.  Talk with your health care provider about whether you are at high risk of being infected with HIV. If you choose to begin PrEP, you should first be tested for HIV. You should then be tested every 3 months for as long as you are taking PrEP.  Use sunscreen. Apply sunscreen liberally and repeatedly throughout the day. You should seek shade when your shadow is shorter than you. Protect yourself by wearing long sleeves, pants, a wide-brimmed hat, and sunglasses year round whenever you are outdoors.  Tell your health care provider of new moles or changes in moles, especially if there is a change in shape or color. Also, tell your health care provider if a mole is larger than the size of a pencil eraser.  A one-time screening for  abdominal aortic aneurysm (AAA) and surgical repair of large AAAs by ultrasound is recommended for men aged 70-75 years who are current or former smokers.  Stay current with your vaccines (immunizations). Document Released: 11/22/2007 Document Revised: 05/31/2013 Document Reviewed: 10/21/2010 [facility] Patient Information 2015 [place], [place]. This information is not intended to replace advice given to you by your health care provider. Make sure you discuss any questions you have with your health care provider.

## 2014-11-26 NOTE — Assessment & Plan Note (Signed)
accompanied by chronic fatigue, night sweats and snoring.  Sleep study ordered.

## 2014-11-26 NOTE — Assessment & Plan Note (Signed)
Suggested by ultrasound of abdomen done to work up report of recurrent night sweats.  Diagnosis discussed with patient and referral to [facility] advised.

## 2014-11-26 NOTE — Assessment & Plan Note (Signed)

## 2014-11-26 NOTE — Assessment & Plan Note (Signed)
Workup thus far negative,  Suspect OSA> sleep study ordered.

## 2014-11-27 LAB — TESTOSTERONE, FREE, TOTAL, SHBG
Sex Hormone Binding: 88 nmol/L — ABNORMAL HIGH (ref 22–77)
Testosterone, Free: 68.5 pg/mL (ref 47.0–244.0)
Testosterone-% Free: 1.1 % — ABNORMAL LOW (ref 1.6–2.9)
Testosterone: 648 ng/dL (ref 300–890)

## 2014-12-04 ENCOUNTER — Encounter: Payer: Self-pay | Admitting: *Deleted

## 2015-01-01 DIAGNOSIS — Z9889 Other specified postprocedural states: Secondary | ICD-10-CM | POA: Insufficient documentation

## 2015-01-01 DIAGNOSIS — M79671 Pain in right foot: Secondary | ICD-10-CM | POA: Insufficient documentation

## 2015-01-04 ENCOUNTER — Ambulatory Visit: Payer: PPO | Attending: Otolaryngology

## 2015-01-04 DIAGNOSIS — I2510 Atherosclerotic heart disease of native coronary artery without angina pectoris: Secondary | ICD-10-CM | POA: Insufficient documentation

## 2015-01-04 DIAGNOSIS — R5382 Chronic fatigue, unspecified: Secondary | ICD-10-CM | POA: Insufficient documentation

## 2015-01-04 DIAGNOSIS — E669 Obesity, unspecified: Secondary | ICD-10-CM | POA: Diagnosis not present

## 2015-01-04 DIAGNOSIS — Z833 Family history of diabetes mellitus: Secondary | ICD-10-CM | POA: Diagnosis not present

## 2015-01-04 DIAGNOSIS — Z7982 Long term (current) use of aspirin: Secondary | ICD-10-CM | POA: Diagnosis not present

## 2015-01-04 DIAGNOSIS — Z6832 Body mass index (BMI) 32.0-32.9, adult: Secondary | ICD-10-CM | POA: Insufficient documentation

## 2015-01-04 DIAGNOSIS — R51 Headache: Secondary | ICD-10-CM | POA: Insufficient documentation

## 2015-01-04 DIAGNOSIS — I10 Essential (primary) hypertension: Secondary | ICD-10-CM | POA: Diagnosis present

## 2015-01-04 DIAGNOSIS — Z79899 Other long term (current) drug therapy: Secondary | ICD-10-CM | POA: Insufficient documentation

## 2015-01-04 DIAGNOSIS — G4733 Obstructive sleep apnea (adult) (pediatric): Secondary | ICD-10-CM | POA: Insufficient documentation

## 2015-01-04 DIAGNOSIS — Z87891 Personal history of nicotine dependence: Secondary | ICD-10-CM | POA: Diagnosis not present

## 2015-01-04 DIAGNOSIS — R61 Generalized hyperhidrosis: Secondary | ICD-10-CM | POA: Diagnosis not present

## 2015-01-04 DIAGNOSIS — Z808 Family history of malignant neoplasm of other organs or systems: Secondary | ICD-10-CM | POA: Insufficient documentation

## 2015-01-04 DIAGNOSIS — R0683 Snoring: Secondary | ICD-10-CM | POA: Diagnosis present

## 2015-01-04 DIAGNOSIS — G4761 Periodic limb movement disorder: Secondary | ICD-10-CM | POA: Diagnosis not present

## 2015-01-04 DIAGNOSIS — E785 Hyperlipidemia, unspecified: Secondary | ICD-10-CM | POA: Diagnosis not present

## 2015-01-04 DIAGNOSIS — Z8249 Family history of ischemic heart disease and other diseases of the circulatory system: Secondary | ICD-10-CM | POA: Diagnosis not present

## 2015-01-04 DIAGNOSIS — R5381 Other malaise: Secondary | ICD-10-CM | POA: Diagnosis present

## 2015-01-05 ENCOUNTER — Observation Stay
Admission: EM | Admit: 2015-01-05 | Discharge: 2015-01-06 | Disposition: A | Discharge status: Home / Self Care | Payer: PPO | Attending: Internal Medicine | Admitting: Internal Medicine

## 2015-01-05 ENCOUNTER — Encounter: Payer: Self-pay | Admitting: *Deleted

## 2015-01-05 ENCOUNTER — Emergency Department: Payer: PPO

## 2015-01-05 DIAGNOSIS — Z79899 Other long term (current) drug therapy: Secondary | ICD-10-CM | POA: Diagnosis not present

## 2015-01-05 DIAGNOSIS — I739 Peripheral vascular disease, unspecified: Secondary | ICD-10-CM | POA: Diagnosis not present

## 2015-01-05 DIAGNOSIS — Z8249 Family history of ischemic heart disease and other diseases of the circulatory system: Secondary | ICD-10-CM | POA: Insufficient documentation

## 2015-01-05 DIAGNOSIS — Z955 Presence of coronary angioplasty implant and graft: Secondary | ICD-10-CM | POA: Diagnosis not present

## 2015-01-05 DIAGNOSIS — K210 Gastro-esophageal reflux disease with esophagitis: Secondary | ICD-10-CM | POA: Diagnosis not present

## 2015-01-05 DIAGNOSIS — Z87891 Personal history of nicotine dependence: Secondary | ICD-10-CM | POA: Insufficient documentation

## 2015-01-05 DIAGNOSIS — M79602 Pain in left arm: Secondary | ICD-10-CM | POA: Insufficient documentation

## 2015-01-05 DIAGNOSIS — R002 Palpitations: Secondary | ICD-10-CM | POA: Insufficient documentation

## 2015-01-05 DIAGNOSIS — I10 Essential (primary) hypertension: Secondary | ICD-10-CM | POA: Diagnosis not present

## 2015-01-05 DIAGNOSIS — Z833 Family history of diabetes mellitus: Secondary | ICD-10-CM | POA: Insufficient documentation

## 2015-01-05 DIAGNOSIS — R911 Solitary pulmonary nodule: Secondary | ICD-10-CM | POA: Insufficient documentation

## 2015-01-05 DIAGNOSIS — R5383 Other fatigue: Secondary | ICD-10-CM | POA: Insufficient documentation

## 2015-01-05 DIAGNOSIS — Z85828 Personal history of other malignant neoplasm of skin: Secondary | ICD-10-CM | POA: Diagnosis not present

## 2015-01-05 DIAGNOSIS — R079 Chest pain, unspecified: Secondary | ICD-10-CM

## 2015-01-05 DIAGNOSIS — R61 Generalized hyperhidrosis: Secondary | ICD-10-CM | POA: Diagnosis not present

## 2015-01-05 DIAGNOSIS — R1310 Dysphagia, unspecified: Secondary | ICD-10-CM | POA: Diagnosis not present

## 2015-01-05 DIAGNOSIS — Z7982 Long term (current) use of aspirin: Secondary | ICD-10-CM | POA: Insufficient documentation

## 2015-01-05 DIAGNOSIS — I252 Old myocardial infarction: Secondary | ICD-10-CM | POA: Diagnosis not present

## 2015-01-05 DIAGNOSIS — E785 Hyperlipidemia, unspecified: Secondary | ICD-10-CM | POA: Insufficient documentation

## 2015-01-05 DIAGNOSIS — R0789 Other chest pain: Principal | ICD-10-CM | POA: Insufficient documentation

## 2015-01-05 DIAGNOSIS — R51 Headache: Secondary | ICD-10-CM | POA: Diagnosis not present

## 2015-01-05 DIAGNOSIS — Z809 Family history of malignant neoplasm, unspecified: Secondary | ICD-10-CM | POA: Diagnosis not present

## 2015-01-05 DIAGNOSIS — I25110 Atherosclerotic heart disease of native coronary artery with unstable angina pectoris: Secondary | ICD-10-CM | POA: Insufficient documentation

## 2015-01-05 DIAGNOSIS — R5381 Other malaise: Secondary | ICD-10-CM | POA: Diagnosis not present

## 2015-01-05 DIAGNOSIS — I25111 Atherosclerotic heart disease of native coronary artery with angina pectoris with documented spasm: Secondary | ICD-10-CM | POA: Insufficient documentation

## 2015-01-05 DIAGNOSIS — K760 Fatty (change of) liver, not elsewhere classified: Secondary | ICD-10-CM | POA: Diagnosis not present

## 2015-01-05 DIAGNOSIS — I25119 Atherosclerotic heart disease of native coronary artery with unspecified angina pectoris: Secondary | ICD-10-CM | POA: Insufficient documentation

## 2015-01-05 LAB — CBC
HCT: 45.9 % (ref 40.0–52.0)
Hemoglobin: 15.5 g/dL (ref 13.0–18.0)
MCH: 30.1 pg (ref 26.0–34.0)
MCHC: 33.7 g/dL (ref 32.0–36.0)
MCV: 89.4 fL (ref 80.0–100.0)
Platelets: 193 10*3/uL (ref 150–440)
RBC: 5.14 MIL/uL (ref 4.40–5.90)
RDW: 12.9 % (ref 11.5–14.5)
WBC: 9.5 10*3/uL (ref 3.8–10.6)

## 2015-01-05 LAB — BASIC METABOLIC PANEL
Anion gap: 11 (ref 5–15)
BUN: 32 mg/dL — ABNORMAL HIGH (ref 6–20)
CO2: 22 mmol/L (ref 22–32)
Calcium: 9.1 mg/dL (ref 8.9–10.3)
Chloride: 102 mmol/L (ref 101–111)
Creatinine, Ser: 1.08 mg/dL (ref 0.61–1.24)
GFR calc Af Amer: >60 mL/min (ref >60)
GFR calc non Af Amer: >60 mL/min (ref >60)
Glucose, Bld: 111 mg/dL — ABNORMAL HIGH (ref 65–99)
Potassium: 3.8 mmol/L (ref 3.5–5.1)
Sodium: 135 mmol/L (ref 135–145)

## 2015-01-05 LAB — TROPONIN I
Troponin I: <0.03 ng/mL (ref <0.031)
Troponin I: <0.03 ng/mL (ref <0.031)

## 2015-01-05 MED ORDER — SODIUM CHLORIDE 0.9 % IJ SOLN: 3.0000 mL | Freq: Two times a day (BID) | INTRAMUSCULAR | Status: DC

## 2015-01-05 MED ORDER — SODIUM CHLORIDE 0.9 % IJ SOLN
3.0000 mL | Freq: Two times a day (BID) | INTRAMUSCULAR | Status: DC
  Administered 2015-01-05 – 2015-01-06 (×2): 3 mL via INTRAVENOUS

## 2015-01-05 MED ORDER — ASPIRIN 81 MG PO CHEW
324.0000 mg | CHEWABLE_TABLET | Freq: Once | ORAL | Status: AC
  Administered 2015-01-05: 324 mg via ORAL
  Filled 2015-01-05: qty 4

## 2015-01-05 MED ORDER — CALCIUM CARBONATE-VITAMIN D 500-200 MG-UNIT PO TABS
1.0000 | ORAL_TABLET | Freq: Every day | ORAL | Status: DC
  Administered 2015-01-06: 1 via ORAL
  Filled 2015-01-05: qty 1

## 2015-01-05 MED ORDER — VITAMIN C 500 MG PO TABS
500.0000 mg | ORAL_TABLET | Freq: Every day | ORAL | Status: DC
  Administered 2015-01-06: 500 mg via ORAL
  Filled 2015-01-05: qty 1

## 2015-01-05 MED ORDER — METOPROLOL SUCCINATE ER 25 MG PO TB24
25.0000 mg | ORAL_TABLET | Freq: Every day | ORAL | Status: DC
  Administered 2015-01-05 – 2015-01-06 (×2): 25 mg via ORAL
  Filled 2015-01-05 (×2): qty 1

## 2015-01-05 MED ORDER — SODIUM CHLORIDE 0.9 % IJ SOLN: 3.0000 mL | INTRAMUSCULAR | Status: DC | PRN

## 2015-01-05 MED ORDER — PANTOPRAZOLE SODIUM 40 MG PO TBEC
40.0000 mg | DELAYED_RELEASE_TABLET | Freq: Every day | ORAL | Status: DC
  Administered 2015-01-05 – 2015-01-06 (×2): 40 mg via ORAL
  Filled 2015-01-05 (×2): qty 1

## 2015-01-05 MED ORDER — SODIUM CHLORIDE 0.9 % IV SOLN: 250.0000 mL | INTRAVENOUS | Status: DC | PRN

## 2015-01-05 MED ORDER — VITAMIN B-12 1000 MCG PO TABS
1000.0000 ug | ORAL_TABLET | Freq: Every day | ORAL | Status: DC
  Administered 2015-01-05 – 2015-01-06 (×2): 1000 ug via ORAL
  Filled 2015-01-05 (×2): qty 1

## 2015-01-05 MED ORDER — SIMVASTATIN 20 MG PO TABS
20.0000 mg | ORAL_TABLET | Freq: Every day | ORAL | Status: DC
  Administered 2015-01-05: 20 mg via ORAL
  Filled 2015-01-05: qty 1

## 2015-01-05 MED ORDER — VITAMIN E 180 MG (400 UNIT) PO CAPS
400.0000 [IU] | ORAL_CAPSULE | Freq: Every day | ORAL | Status: DC
  Administered 2015-01-06: 400 [IU] via ORAL
  Filled 2015-01-05: qty 1

## 2015-01-05 MED ORDER — TRAMADOL HCL 50 MG PO TABS: 50.0000 mg | ORAL_TABLET | Freq: Four times a day (QID) | ORAL | Status: DC | PRN

## 2015-01-05 MED ORDER — ENOXAPARIN SODIUM 40 MG/0.4ML ~~LOC~~ SOLN
40.0000 mg | SUBCUTANEOUS | Status: DC
  Administered 2015-01-05: 40 mg via SUBCUTANEOUS
  Filled 2015-01-05: qty 0.4

## 2015-01-05 MED ORDER — ZOLPIDEM TARTRATE 5 MG PO TABS: 5.0000 mg | ORAL_TABLET | Freq: Every evening | ORAL | Status: DC | PRN

## 2015-01-05 MED ORDER — ASPIRIN EC 325 MG PO TBEC
325.0000 mg | DELAYED_RELEASE_TABLET | Freq: Every day | ORAL | Status: DC
  Administered 2015-01-06: 325 mg via ORAL
  Filled 2015-01-05: qty 1

## 2015-01-05 MED ORDER — ZINC SULFATE 220 (50 ZN) MG PO CAPS
ORAL_CAPSULE | Freq: Every day | ORAL | Status: DC
  Administered 2015-01-06: 220 mg via ORAL
  Filled 2015-01-05: qty 1

## 2015-01-05 MED ORDER — NITROGLYCERIN 0.4 MG SL SUBL: 0.4000 mg | SUBLINGUAL_TABLET | SUBLINGUAL | Status: DC | PRN

## 2015-01-05 NOTE — ED Notes (Signed)
Pt reports onset of CP that started last night, persisted through the day. Pt took 3 nitro at home PTA without relief.

## 2015-01-05 NOTE — H&P (Signed)
Randall Ali. is an 74 y.o. male.   Chief Complaint: Palpitations and left arm pain HPI: Patient developed on and off palpitations yesterday followed by pain radiating down left arm. Symptoms returned today. Has history of CAD. Currently symptom free.  Past Medical History  Diagnosis Date  . CAD (coronary artery disease)     s/p inferior wall infarct in 10/01. has stent in Rt coronary artery. is due a stress myoview. does have some dyspnea on exertion  . SOB (shortness of breath) on exertion   . 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 Chest pain   . HLD (hyperlipidemia)     isdue followup lipids. on zocor 10 mg/day   . HTN (hypertension)   . Allergy     seasonal  . Cataract     removed  . Cancer     skin  . Reflux esophagitis     Past Surgical History  Procedure Laterality Date  . Cardiac catheterization  06/24/11  . Carotid stent  03/10/2011  . Arm surgery  2010  . Shoulder arthroscopy  2012  . Wrist surgery  2011  . Toe surgery  1994  . Nasal sinus surgery  2008    septpolasty, bilateral turbinate reduction  . Hammer toe surgery      right toe  . Colonoscopy  2010    Family History  Problem Relation Age of Onset  . Hypertension Mother   . Heart disease Mother   . Hypertension Father   . Diabetes Father   . Cancer Paternal Grandfather   . Heart disease Brother 17  . Colon cancer Neg Hx    Social History:  reports that he quit smoking about 14 years ago. His smoking use included Cigarettes. He has a 100 pack-year smoking history. He has never used smokeless tobacco. He reports that he drinks about 6.0 oz of alcohol per week. He reports that he does not use illicit drugs.  Allergies: No Known Allergies   (Not in a hospital admission)  Results for orders placed or performed during the hospital encounter of 01/05/15 (from the past 48 hour(s))  Basic metabolic panel     Status: Abnormal   Collection Time: 01/05/15  5:58 PM  Result Value Ref  Range   Sodium 135 135 - 145 mmol/L   Potassium 3.8 3.5 - 5.1 mmol/L   Chloride 102 101 - 111 mmol/L   CO2 22 22 - 32 mmol/L   Glucose, Bld 111 (H) 65 - 99 mg/dL   BUN 32 (H) 6 - 20 mg/dL   Creatinine, Ser 1.08 0.61 - 1.24 mg/dL   Calcium 9.1 8.9 - 10.3 mg/dL   GFR calc non Af Amer >60 >60 mL/min   GFR calc Af Amer >60 >60 mL/min    Comment: (NOTE) The eGFR has been calculated using the CKD EPI equation. This calculation has not been validated in all clinical situations. eGFR's persistently <60 mL/min signify possible Chronic Kidney Disease.    Anion gap 11 5 - 15  CBC     Status: None   Collection Time: 01/05/15  5:58 PM  Result Value Ref Range   WBC 9.5 3.8 - 10.6 K/uL   RBC 5.14 4.40 - 5.90 MIL/uL   Hemoglobin 15.5 13.0 - 18.0 g/dL   HCT 45.9 40.0 - 52.0 %   MCV 89.4 80.0 - 100.0 fL   MCH 30.1 26.0 - 34.0 pg   MCHC 33.7 32.0 - 36.0 g/dL  RDW 12.9 11.5 - 14.5 %   Platelets 193 150 - 440 K/uL  Troponin I     Status: None   Collection Time: 01/05/15  5:58 PM  Result Value Ref Range   Troponin I <0.03 <0.031 ng/mL    Comment:        NO INDICATION OF MYOCARDIAL INJURY.    Dg Chest 2 View  01/05/2015   CLINICAL DATA:  Chest pain and left arm pain. Heart fluttering since last night with diaphoresis. Previous heart attack 2001.  EXAM: CHEST  2 VIEW  COMPARISON:  11/02/2014  FINDINGS: Lungs are adequately inflated without consolidation or effusion. Cardiomediastinal silhouette is within normal. There are mild degenerative changes of the spine. Mild flattening of the hemidiaphragms.  IMPRESSION: No active cardiopulmonary disease.   Electronically Signed   By: Marin Olp M.D.   On: 01/05/2015 18:19    Review of Systems  Constitutional: Negative for fever and chills.  HENT: Negative for hearing loss.   Eyes: Negative for blurred vision.  Respiratory: Negative for shortness of breath.   Cardiovascular: Positive for chest pain and palpitations.  Gastrointestinal: Negative  for nausea and vomiting.  Genitourinary: Negative for dysuria.  Musculoskeletal: Positive for joint pain.  Skin: Negative for rash.  Neurological: Negative for tremors and loss of consciousness.    Blood pressure 117/71, pulse 62, temperature 97.9 F (36.6 C), temperature source Oral, resp. rate 16, height _0  (1.727 m), weight 95.255 kg (210 lb), SpO2 97 %. Physical Exam  Constitutional: He is oriented to person, place, and time. He appears well-developed and well-nourished.  HENT:  Head: Normocephalic and atraumatic.  Mouth/Throat: Oropharynx is clear and moist. No oropharyngeal exudate.  Eyes: Pupils are equal, round, and reactive to light. Left eye exhibits discharge. No scleral icterus.  Neck: Neck supple. No JVD present. No thyromegaly present.  Cardiovascular: Normal rate.   No murmur heard. Respiratory: Effort normal. No respiratory distress. He has no wheezes. He exhibits no tenderness.  GI: Soft. Bowel sounds are normal. He exhibits no distension and no mass.  Musculoskeletal: He exhibits no edema or tenderness.  Lymphadenopathy:    He has no cervical adenopathy.  Neurological: He is alert and oriented to person, place, and time.  Skin: Skin is warm and dry. No rash noted.     Assessment/Plan 1. Chest Pain: Associated with palpitation. No arythmia on monitor. Pain radiating down left arm. Has history of CAD. This may be atypical presentation so will observe overnight, rule out and consult cardiology.  2. CAD: Continue asa, B-blocker.  3. HTN: Controlled with current meds  4. Hyperlipedemia: On statin  Time spent = 37mn  JBaxter Hire7/29/2016, 7:32 PM

## 2015-01-05 NOTE — ED Notes (Signed)
Family at bedside. Pt placed on cardiac moniter. No pain at this time

## 2015-01-05 NOTE — ED Provider Notes (Signed)
[facility] Emergency Department Provider Note  Time seen: 6:31 PM  I have reviewed the triage vital signs and the nursing notes.   HISTORY  Chief Complaint Chest Pain    HPI Randall Mangel. is a 74 y.o. male with a past medical history of hypertension, hyperlipidemia, coronary artery disease status post myocardial infarction in 2001 with stent placed presents the emergency department today for intermittent chest pain, palpitations. According to the patient for the past 2 days he's been having intermittent palpitations, along with chest pain with exertion. Patient states the chest pain with exertion has been ongoing for the last several weeks, but worse over the last few days. Today while exerting he experience chest pain, along with diaphoresis and pain in his left arm which she describes as dull and aching. His symptoms have resolved besides intermittent palpitations. Patient is not currently taking any blood thinners. Denies any lower extremity pain or swelling. Patient describes his chest pain is moderate when it occurs, and is relieved by rest. Worsen with exertion. Dull/aching quality.     Past Medical History  Diagnosis Date  . CAD (coronary artery disease)     s/p inferior wall infarct in 10/01. has stent in Rt coronary artery. is due a stress myoview. does have some dyspnea on exertion  . SOB (shortness of breath) on exertion   . 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 Chest pain   . HLD (hyperlipidemia)     isdue followup lipids. on zocor 10 mg/day   . HTN (hypertension)   . Allergy     seasonal  . Cataract     removed  . Cancer     skin  . Reflux esophagitis     Patient Active Problem List   Diagnosis Date Noted  . Hepatic steatosis 11/24/2014  . Unexplained night sweats 11/02/2014  . Angina pectoris 08/11/2014  . PAD (peripheral artery disease) 04/11/2014  . Chronic daily headache 04/11/2014  . Chronic fatigue and  malaise 01/11/2014  . Inflammation of toe 05/19/2013  . H/O arthroplasty 05/19/2013  . Screening for prostate cancer 02/27/2012  . Medicare annual wellness visit, subsequent 02/27/2012  . Erectile dysfunction 02/27/2012  . HLD (hyperlipidemia)   . HTN (hypertension)   . Dysphagia 12/24/2011  . Pulmonary nodule 12/24/2011  . HYPERLIPIDEMIA-MIXED 03/12/2010  . OLD MYOCARDIAL INFARCTION 03/08/2009  . CAD, NATIVE VESSEL 03/08/2009    Past Surgical History  Procedure Laterality Date  . Cardiac catheterization  06/24/11  . Carotid stent  03/10/2011  . Arm surgery  2010  . Shoulder arthroscopy  2012  . Wrist surgery  2011  . Toe surgery  1994  . Nasal sinus surgery  2008    septpolasty, bilateral turbinate reduction  . Hammer toe surgery      right toe  . Colonoscopy  2010    Current Outpatient Rx  Name  Route  Sig  Dispense  Refill  . aspirin 81 MG EC tablet   Oral   Take 81 mg by mouth daily.           . Calcium-Vitamin D (RA CALCIUM PLUS VITAMIN D) 600-125 MG-UNIT TABS   Oral   Take 1 tablet by mouth daily.           . Cyanocobalamin (RA VITAMIN B-12 TR) 1000 MCG TBCR   Oral   Take by mouth.         . metoprolol succinate (TOPROL-XL) 25 MG  24 hr tablet   Oral   Take 1 tablet (25 mg total) by mouth daily.   90 tablet   1   . nitroGLYCERIN (NITROSTAT) 0.4 MG SL tablet   Sublingual   Place 1 tablet (0.4 mg total) under the tongue every 5 (five) minutes as needed for chest pain.   25 tablet   11   . omeprazole (PRILOSEC) 40 MG capsule   Oral   Take 1 capsule (40 mg total) by mouth daily.   30 capsule   2     Pharmacy-Please d/c rx for pantoprazole.   . simvastatin (ZOCOR) 20 MG tablet      TAKE ONE (1) TABLET EACH DAY   90 tablet   3   . tadalafil (CIALIS) 20 MG tablet   Oral   Take 1 tablet (20 mg total) by mouth daily as needed for erectile dysfunction.   10 tablet   0   . traMADol (ULTRAM) 50 MG tablet   Oral   Take 1 tablet (50 mg total)  by mouth every 6 (six) hours as needed.   60 tablet   5   . vitamin C (ASCORBIC ACID) 500 MG tablet   Oral   Take by mouth.         . vitamin E 400 UNIT capsule   Oral   Take by mouth.         . Zinc 100 MG TABS   Oral   Take 1 tablet by mouth daily.           [name] zolpidem (AMBIEN) 10 MG tablet      TAKE ONE TABLET AT BEDTIME AS NEEDED FORSLEEP   15 tablet   3     Allergies Review of patient's allergies indicates no known allergies.  Family History  Problem Relation Age of Onset  . Hypertension Mother   . Heart disease Mother   . Hypertension Father   . Diabetes Father   . Cancer Paternal Grandfather   . Heart disease Brother 61  . Colon cancer Neg Hx     Social History History  Substance Use Topics  . Smoking status: Former Smoker -- 2.50 packs/day for 40 years    Types: Cigarettes    Quit date: 03/24/2000  . Smokeless tobacco: Never Used  . Alcohol Use: 6.0 oz/week    10 Shots of liquor per week     Comment: occasional    Review of Systems Constitutional: Negative for fever positive for diaphoresis. Cardiovascular: Positive for chest pain. Respiratory: Negative for shortness of breath. Gastrointestinal: Negative for abdominal pain. positive for nausea Neurological: Negative for headache 10-point ROS otherwise negative.  ____________________________________________   PHYSICAL EXAM:  VITAL SIGNS: ED Triage Vitals  Enc Vitals Group     BP 01/05/15 1749 115/58 mmHg     Pulse Rate 01/05/15 1749 68     Resp 01/05/15 1749 16     Temp 01/05/15 1749 97.9 F (36.6 C)     Temp Source 01/05/15 1749 Oral     SpO2 01/05/15 1749 95 %     Weight 01/05/15 1749 210 lb (95.255 kg)     Height 01/05/15 1749 '5\' 8"'$  (1.727 m)     Head Cir --      Peak Flow --      Pain Score 01/05/15 1749 3     Pain Loc --      Pain Edu? --      Excl. in [place]? --  Constitutional: Alert and oriented. Well appearing and in no distress. ENT   Mouth/Throat: Mucous  membranes are moist. Cardiovascular: Normal rate, regular rhythm. No murmur Respiratory: Normal respiratory effort without tachypnea nor retractions. Breath sounds are clear and equal bilaterally. No wheezes/rales/rhonchi. Gastrointestinal: Soft and nontender. No distention.   Musculoskeletal: Nontender with normal range of motion in all extremities. No lower extremity tenderness or edema. Recent incision to right toes. No signs of DVT. Neurologic:  Normal speech and language. No gross focal neurologic deficits  Skin:  Skin is warm, dry and intact.  Psychiatric: Mood and affect are normal. Speech and behavior are normal.   ____________________________________________    EKG  EKG reviewed and interpreted by myself shows sinus rhythm at 71 bpm, occasional PVC, narrow QRS, normal axis, normal intervals, no ST changes noted.  ____________________________________________    RADIOLOGY  Chest x-ray shows no acute abnormality.  ____________________________________________    INITIAL IMPRESSION / ASSESSMENT AND PLAN / ED COURSE  Pertinent labs & imaging results that were available during my care of the patient were reviewed by me and considered in my medical decision making (see chart for details).  Patient with concerning chest pain. Chest pain most consistent with stable angina, however given the worsening nature of his chest pain, along with diaphoresis, nausea, and left arm pain today we will admit the patient for cardiac workup.  ____________________________________________   FINAL CLINICAL IMPRESSION(S) / ED DIAGNOSES  Chest pain   Harvest Dark, MD 01/05/15 [phone]

## 2015-01-05 NOTE — ED Notes (Signed)
MD at bedside. 

## 2015-01-06 DIAGNOSIS — I209 Angina pectoris, unspecified: Secondary | ICD-10-CM

## 2015-01-06 DIAGNOSIS — I25111 Atherosclerotic heart disease of native coronary artery with angina pectoris with documented spasm: Secondary | ICD-10-CM | POA: Diagnosis not present

## 2015-01-06 DIAGNOSIS — I10 Essential (primary) hypertension: Secondary | ICD-10-CM | POA: Diagnosis not present

## 2015-01-06 DIAGNOSIS — E785 Hyperlipidemia, unspecified: Secondary | ICD-10-CM | POA: Diagnosis not present

## 2015-01-06 LAB — TROPONIN I
Troponin I: <0.03 ng/mL (ref <0.031)
Troponin I: <0.03 ng/mL (ref <0.031)

## 2015-01-06 MED ORDER — ISOSORBIDE MONONITRATE ER 30 MG PO TB24: 30.0000 mg | ORAL_TABLET | Freq: Every day | ORAL | Status: DC

## 2015-01-06 MED ORDER — NITROGLYCERIN 2 % TD OINT: 0.5000 [in_us] | TOPICAL_OINTMENT | Freq: Four times a day (QID) | TRANSDERMAL | Status: DC

## 2015-01-06 MED ORDER — ISOSORBIDE MONONITRATE ER 30 MG PO TB24
15.0000 mg | ORAL_TABLET | Freq: Every day | ORAL | Status: DC
  Administered 2015-01-06: 15 mg via ORAL
  Filled 2015-01-06: qty 1

## 2015-01-06 NOTE — Discharge Summary (Signed)
Randall Ali., 74 y.o., DOB Oct 21, 1940, MRN [id number]. Admission date: 01/05/2015 Discharge Date 01/06/2015 Primary MD [name], MD Admitting Physician Baxter Hire, MD  Admission Diagnosis  Chest Pain  Discharge Diagnosis   Active Problems:   Chest pain felt to be atypical by cardiology possible musculoskeletal in nature he'll have cardiac catheter in the near   Coronary artery disease involving native coronary artery of native heart with angina pectoris with documented spasm   Hyperlipidemia   Essential hypertension         Hospital Course patient is a 74 year old with known history of coronary artery disease with MI in the past presented to the emergency with complaint of having fluttering feeling in his chest. He was admitted under observation. He continued to have this sensation during the night. He was seen by cardiology who felt that his symptoms were very atypical. Patient patient is very anxious to go home. He'll have a cardiac catheter set up as outpatient. He denies any shortness of breath at this point is stable for discharge           Consults  cardiology  Significant Tests:  See full reports for all details    Dg Chest 2 View  01/05/2015   CLINICAL DATA:  Chest pain and left arm pain. Heart fluttering since last night with diaphoresis. Previous heart attack 2001.  EXAM: CHEST  2 VIEW  COMPARISON:  11/02/2014  FINDINGS: Lungs are adequately inflated without consolidation or effusion. Cardiomediastinal silhouette is within normal. There are mild degenerative changes of the spine. Mild flattening of the hemidiaphragms.  IMPRESSION: No active cardiopulmonary disease.   Electronically Signed   By: Marin Olp M.D.   On: 01/05/2015 18:19       Today   Subjective:   Randall Ali  feels better complains of some fluttering feeling in the chest cardiac enzymes remain negative  Objective:   Blood pressure 127/59, pulse 59, temperature 97.6 F  (36.4 C), temperature source Oral, resp. rate 20, height '5\' 8"'$  (1.727 m), weight 92.942 kg (204 lb 14.4 oz), SpO2 90 %.  .  Intake/Output Summary (Last 24 hours) at 01/06/15 1239 Last data filed at 01/06/15 0830  Gross per 24 hour  Intake    120 ml  Output    950 ml  Net   -830 ml    Exam VITAL SIGNS: Blood pressure 127/59, pulse 59, temperature 97.6 F (36.4 C), temperature source Oral, resp. rate 20, height '5\' 8"'$  (1.727 m), weight 92.942 kg (204 lb 14.4 oz), SpO2 90 %.  GENERAL:  74 y.o.-year-old patient lying in the bed with no acute distress.  EYES: Pupils equal, round, reactive to light and accommodation. No scleral icterus. Extraocular muscles intact.  HEENT: Head atraumatic, normocephalic. Oropharynx and nasopharynx clear.  NECK:  Supple, no jugular venous distention. No thyroid enlargement, no tenderness.  LUNGS: Normal breath sounds bilaterally, no wheezing, rales,rhonchi or crepitation. No use of accessory muscles of respiration.  CARDIOVASCULAR: S1, S2 normal. No murmurs, rubs, or gallops.  ABDOMEN: Soft, nontender, nondistended. Bowel sounds present. No organomegaly or mass.  EXTREMITIES: No pedal edema, cyanosis, or clubbing.  NEUROLOGIC: Cranial nerves II through XII are intact. Muscle strength 5/5 in all extremities. Sensation intact. Gait not checked.  PSYCHIATRIC: The patient is alert and oriented x 3.  SKIN: No obvious rash, lesion, or ulcer.   Data Review     CBC w Diff: Lab Results  Component Value Date   WBC 9.5  01/05/2015   WBC 6.9 06/13/2011   HGB 15.5 01/05/2015   HCT 45.9 01/05/2015   PLT 193 01/05/2015   LYMPHOPCT 18.9 11/02/2014   MONOPCT 13.2* 11/02/2014   EOSPCT 1.8 11/02/2014   BASOPCT 0.4 11/02/2014   CMP: Lab Results  Component Value Date   NA 135 01/05/2015   NA 138 06/13/2011   K 3.8 01/05/2015   CL 102 01/05/2015   CO2 22 01/05/2015   BUN 32* 01/05/2015   BUN 14 06/13/2011   CREATININE 1.08 01/05/2015   PROT 6.7 11/24/2014    ALBUMIN 4.5 11/24/2014   BILITOT 0.9 11/24/2014   ALKPHOS 73 11/24/2014   AST 15 11/24/2014   ALT 16 11/24/2014  .  Micro Results No results found for this or any previous visit (from the past 240 hour(s)).      Code Status Orders        Start     Ordered   01/05/15 2102  Full code   Continuous     01/05/15 2101          Follow-up Information    Follow up with [name], MD In 5 days.   Specialty:  Cardiology   Contact information:   [place] [address] [phone]       Discharge Medications     Medication List    TAKE these medications        aspirin 81 MG EC tablet  Take 81 mg by mouth daily.     isosorbide mononitrate 30 MG 24 hr tablet  Commonly known as:  IMDUR  Take 1 tablet (30 mg total) by mouth daily.     metoprolol succinate 25 MG 24 hr tablet  Commonly known as:  TOPROL-XL  Take 1 tablet (25 mg total) by mouth daily.     nitroGLYCERIN 0.4 MG SL tablet  Commonly known as:  NITROSTAT  Place 1 tablet (0.4 mg total) under the tongue every 5 (five) minutes as needed for chest pain.     omeprazole 40 MG capsule  Commonly known as:  PRILOSEC  Take 1 capsule (40 mg total) by mouth daily.     RA CALCIUM PLUS VITAMIN D 600-125 MG-UNIT Tabs  Generic drug:  Calcium-Vitamin D  Take 1 tablet by mouth daily.     simvastatin 20 MG tablet  Commonly known as:  ZOCOR  Take 20 mg by mouth at bedtime.     tadalafil 20 MG tablet  Commonly known as:  CIALIS  Take 1 tablet (20 mg total) by mouth daily as needed for erectile dysfunction.     traMADol 50 MG tablet  Commonly known as:  ULTRAM  Take 1 tablet (50 mg total) by mouth every 6 (six) hours as needed.     vitamin B-12 1000 MCG tablet  Commonly known as:  CYANOCOBALAMIN  Take 1,000 mcg by mouth daily.     vitamin C 500 MG tablet  Commonly known as:  ASCORBIC ACID  Take 500 mg by mouth daily.     vitamin E 400 UNIT capsule  Take 400 Units by mouth  daily.     Zinc 100 MG Tabs  Take 1 tablet by mouth daily.     zolpidem 10 MG tablet  Commonly known as:  AMBIEN  TAKE ONE TABLET AT BEDTIME AS NEEDED FORSLEEP           Total Time in preparing paper work, data evaluation and todays exam - 83  minutes  [name] M.D on 01/06/2015 at 12:39 PM  [facility]   Office  [phone]

## 2015-01-06 NOTE — Discharge Instructions (Signed)
°  DIET:  Cardiac diet and Clear liquid diet  DISCHARGE CONDITION:  Stable  ACTIVITY:  Activity as tolerated  OXYGEN:  Home Oxygen: No.   Oxygen Delivery: room air  DISCHARGE LOCATION:  home    ADDITIONAL DISCHARGE INSTRUCTION:   If you experience worsening of your admission symptoms, develop shortness of breath, life threatening emergency, suicidal or homicidal thoughts you must seek medical attention immediately by calling 911 or calling your MD immediately  if symptoms less severe.  You Must read complete instructions/literature along with all the possible adverse reactions/side effects for all the Medicines you take and that have been prescribed to you. Take any new Medicines after you have completely understood and accpet all the possible adverse reactions/side effects.   Please note  You were cared for by a hospitalist during your hospital stay. If you have any questions about your discharge medications or the care you received while you were in the hospital after you are discharged, you can call the unit and asked to speak with the hospitalist on call if the hospitalist that took care of you is not available. Once you are discharged, your primary care physician will handle any further medical issues. Please note that NO REFILLS for any discharge medications will be authorized once you are discharged, as it is imperative that you return to your primary care physician (or establish a relationship with a primary care physician if you do not have one) for your aftercare needs so that they can reassess your need for medications and monitor your lab values.

## 2015-01-06 NOTE — Progress Notes (Signed)
Patient education completed with the patient along with handouts given.  Dr Posey Pronto also gave patient a paper copy of his prescription. Patient taken home by son.

## 2015-01-06 NOTE — Progress Notes (Signed)
[place] at [facility]                                                                                                                                                                                            Patient Demographics   Muhanad Torosyan, is a 74 y.o. male, DOB - 03/15/1941, ZOX:[id number]  Admit date - 01/05/2015   Admitting Physician Baxter Hire, MD  Outpatient Primary MD for the patient is [name], MD   LOS -   Subjective: Patient continues to have fluttering feeling in his heart, denies any shortness of breath     Review of Systems:   CONSTITUTIONAL: No documented fever. No fatigue, weakness. No weight gain, no weight loss.  EYES: No blurry or double vision.  ENT: No tinnitus. No postnasal drip. No redness of the oropharynx.  RESPIRATORY: No cough, no wheeze, no hemoptysis.  dyspnea.  CARDIOVASCULAR: Positive chest pain. No orthopnea. No palpitations. No syncope.  GASTROINTESTINAL: No nausea, no vomiting or diarrhea. No abdominal pain. No melena or hematochezia.  GENITOURINARY: No dysuria or hematuria.  ENDOCRINE: No polyuria or nocturia. No heat or cold intolerance.  HEMATOLOGY: No anemia. No bruising. No bleeding.  INTEGUMENTARY: No rashes. No lesions.  MUSCULOSKELETAL: No arthritis. No swelling. No gout.  NEUROLOGIC: No numbness, tingling, or ataxia. No seizure-type activity.  PSYCHIATRIC: No anxiety. No insomnia. No ADD.    Vitals:   Filed Vitals:   01/06/15 0352 01/06/15 0811 01/06/15 0812 01/06/15 1131  BP: 134/62 128/64 128/64 127/59  Pulse: 57 56 56 59  Temp: 97.5 F (36.4 C) 97.7 F (36.5 C) 97.7 F (36.5 C) 97.6 F (36.4 C)  TempSrc: Oral Oral Oral Oral  Resp:    20  Height:      Weight:      SpO2: 92%  93% 90%    Wt Readings from Last 3 Encounters:  01/05/15 92.942 kg (204 lb 14.4 oz)  11/24/14 94.121 kg (207 lb 8 oz)  11/02/14 95.369 kg (210 lb 4 oz)     Intake/Output Summary (Last 24  hours) at 01/06/15 1214 Last data filed at 01/06/15 0830  Gross per 24 hour  Intake    120 ml  Output    950 ml  Net   -830 ml    Physical Exam:   GENERAL: Pleasant-appearing in no apparent distress.  HEAD, EYES, EARS, NOSE AND THROAT: Atraumatic, normocephalic. Extraocular muscles are intact. Pupils equal and reactive to light. Sclerae anicteric. No conjunctival injection. No oro-pharyngeal erythema.  NECK: Supple. There is no jugular venous distention. No bruits, no lymphadenopathy, no  thyromegaly.  HEART: Regular rate and rhythm, tachycardic. No murmurs, no rubs, no clicks.  LUNGS: Clear to auscultation bilaterally. No rales or rhonchi. No wheezes.  ABDOMEN: Soft, flat, nontender, nondistended. Has good bowel sounds. No hepatosplenomegaly appreciated.  EXTREMITIES: No evidence of any cyanosis, clubbing, or peripheral edema.  +2 pedal and radial pulses bilaterally.  NEUROLOGIC: The patient is alert, awake, and oriented x3 with no focal motor or sensory deficits appreciated bilaterally.  SKIN: Moist and warm with no rashes appreciated.  Psych: Not anxious, depressed LN: No inguinal LN enlargement    Antibiotics   Anti-infectives    None      Medications   Scheduled Meds: . aspirin EC  325 mg Oral Daily  . calcium-vitamin D  1 tablet Oral Daily  . enoxaparin (LOVENOX) injection  40 mg Subcutaneous Q24H  . metoprolol succinate  25 mg Oral Daily  . nitroGLYCERIN  0.5 inch Topical 4 times per day  . pantoprazole  40 mg Oral Daily  . simvastatin  20 mg Oral QHS  . sodium chloride  3 mL Intravenous Q12H  . sodium chloride  3 mL Intravenous Q12H  . vitamin B-12  1,000 mcg Oral Daily  . vitamin C  500 mg Oral Daily  . vitamin E  400 Units Oral Daily  . zinc sulfate   Oral Daily   Continuous Infusions:  PRN Meds:.sodium chloride, nitroGLYCERIN, sodium chloride, traMADol, zolpidem   Data Review:   Micro Results No results found for this or any previous visit (from the  past 240 hour(s)).  Radiology Reports Dg Chest 2 View  01/05/2015   CLINICAL DATA:  Chest pain and left arm pain. Heart fluttering since last night with diaphoresis. Previous heart attack 2001.  EXAM: CHEST  2 VIEW  COMPARISON:  11/02/2014  FINDINGS: Lungs are adequately inflated without consolidation or effusion. Cardiomediastinal silhouette is within normal. There are mild degenerative changes of the spine. Mild flattening of the hemidiaphragms.  IMPRESSION: No active cardiopulmonary disease.   Electronically Signed   By: Marin Olp M.D.   On: 01/05/2015 18:19     CBC  Recent Labs Lab 01/05/15 1758  WBC 9.5  HGB 15.5  HCT 45.9  PLT 193  MCV 89.4  MCH 30.1  MCHC 33.7  RDW 12.9    Chemistries   Recent Labs Lab 01/05/15 1758  NA 135  K 3.8  CL 102  CO2 22  GLUCOSE 111*  BUN 32*  CREATININE 1.08  CALCIUM 9.1   ------------------------------------------------------------------------------------------------------------------ estimated creatinine clearance is 68.4 mL/min (by C-G formula based on Cr of 1.08). ------------------------------------------------------------------------------------------------------------------ No results for input(s): HGBA1C in the last 72 hours. ------------------------------------------------------------------------------------------------------------------ No results for input(s): CHOL, HDL, LDLCALC, TRIG, CHOLHDL, LDLDIRECT in the last 72 hours. ------------------------------------------------------------------------------------------------------------------ No results for input(s): TSH, T4TOTAL, T3FREE, THYROIDAB in the last 72 hours.  Invalid input(s): FREET3 ------------------------------------------------------------------------------------------------------------------ No results for input(s): VITAMINB12, FOLATE, FERRITIN, TIBC, IRON, RETICCTPCT in the last 72 hours.  Coagulation profile No results for input(s): INR, PROTIME in the  last 168 hours.  No results for input(s): DDIMER in the last 72 hours.  Cardiac Enzymes  Recent Labs Lab 01/05/15 2157 01/06/15 0304 01/06/15 0845  TROPONINI <0.03 <0.03 <0.03   ------------------------------------------------------------------------------------------------------------------ Invalid input(s): POCBNP    Assessment & Plan   . Unstable angina/CAD s/p remote MI and remote stenting as above: -Patient with continued chest pain at this time -Recent nuclear stress test 08/15/2014 that was low risk, now with continued intermittent exertional and non-exertional  symptoms -Continue aspirin, Toprol 25 mg , simvastatin 20 mg Change him door to a Nitropatch due to persistent pain if symptoms persist may need a cardiac catheter on Monday 2. HTN: -Nitropatch -Continue Toprol  3. HLD: -Continue simvastatin check fasting lipid panel tomorrow a.m.      Code Status Orders        Start     Ordered   01/05/15 2102  Full code   Continuous     01/05/15 2101           Consults  cardiology DVT Prophylaxis  Lovenox  Lab Results  Component Value Date   PLT 193 01/05/2015     Time Spent in minutes  45 minutes  Greater than 50% of time spent in care coordination and counseling.   [name] M.D on 01/06/2015 at 12:14 PM  Between 7am to 6pm - Pager - [phone]  After 6pm go to [email or web address] - password [email or web address] [facility]   Office  [phone]

## 2015-01-06 NOTE — Consult Note (Addendum)
Cardiology Consultation Note  Patient ID: Randall Weldy., MRN: [id number], DOB/AGE: 1940/09/13 74 y.o. Admit date: 01/05/2015   Date of Consult: 01/06/2015 Primary Physician: [name], MD Primary Cardiologist: [name], MD  Chief Complaint: Chest pain Reason for Consult: Chest pain  HPI: 74 y.o. male with h/o CAD s/p remote MI s/p remote stenting to RCA in 03/2000, HTN, HLD, and skin CA who presented to [facility] on 7/30 with several month history of intermittent chest pain that was worse on 7/29 while at work.   He has previous admission in 2011 for mild chest pain and diaphoresis. Stress test at that time showed no ischemia with inferior scar. He last underwent cardiac catheterization in 2013 in the setting of worsening chest pain with exertion that showed moderate OM disease, otherwise no significant disease. Case was discussed with interventional cardiologist in [place] and recommendation was made for medical management.   He had been having some exertional chest pain back in the spring of 2016, therefore a nuclear stress test was performed that showed no significant ischemia, no significant wall motion abnormality was noted, no EKG changes were noted, EF estimated at 54%, overall this was a low risk scan. He has continued to have exertional chest pain, at times similar to his prior MI.   While at work (works at the [employer]) on 7/29 he developed sudden onset of substernal chest pain, palpitations, and diaphoresis. Symptoms lasted until his arrival to [facility] when his palpitations and diaphoresis resolved. He continues to have chest pain at this time. No associated SOB, nausea, vomiting, presyncope, or syncope. Upon his arrival to [facility] his tropon was found to be negative x 4. CXR with no active cardiopulmonary disease. CBC unremarkable. BMP unremarkable. Cardiology is consulted for further input.            Past Medical History  Diagnosis Date  . CAD (coronary artery disease)     s/p  inferior wall infarct in 10/01. has stent in Rt coronary artery. is due a stress myoview. does have some dyspnea on exertion  . SOB (shortness of breath) on exertion   . 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 Chest pain   . HLD (hyperlipidemia)     isdue followup lipids. on zocor 10 mg/day   . HTN (hypertension)   . Allergy     seasonal  . Cataract     removed  . Cancer     skin  . Reflux esophagitis       Most Recent Cardiac Studies: Myoview 08/15/2014:  No significant ischemia. GI uptake noted. No significant wall motion abnormality noted. Estimated EF 54%. No EKG changes. Overall, low risk scan.    Surgical History:  Past Surgical History  Procedure Laterality Date  . Cardiac catheterization  06/24/11  . Carotid stent  03/10/2011  . Arm surgery  2010  . Shoulder arthroscopy  2012  . Wrist surgery  2011  . Toe surgery  1994  . Nasal sinus surgery  2008    septpolasty, bilateral turbinate reduction  . Hammer toe surgery      right toe  . Colonoscopy  2010     Home Meds: Prior to Admission medications   Medication Sig Start Date End Date Taking? Authorizing Provider  aspirin 81 MG EC tablet Take 81 mg by mouth daily.     Yes Historical Provider, MD  Calcium-Vitamin D (RA CALCIUM PLUS VITAMIN D) 600-125 MG-UNIT TABS Take 1  tablet by mouth daily.     Yes Historical Provider, MD  metoprolol succinate (TOPROL-XL) 25 MG 24 hr tablet Take 1 tablet (25 mg total) by mouth daily. 04/10/14  Yes [name], MD  nitroGLYCERIN (NITROSTAT) 0.4 MG SL tablet Place 1 tablet (0.4 mg total) under the tongue every 5 (five) minutes as needed for chest pain. 08/08/14  Yes Minna Merritts, MD  simvastatin (ZOCOR) 20 MG tablet Take 20 mg by mouth at bedtime.   Yes Historical Provider, MD  tadalafil (CIALIS) 20 MG tablet Take 1 tablet (20 mg total) by mouth daily as needed for erectile dysfunction. 02/27/12  Yes [name], MD  traMADol (ULTRAM) 50 MG tablet Take 1 tablet (50 mg  total) by mouth every 6 (six) hours as needed. Patient taking differently: Take 50 mg by mouth every 6 (six) hours as needed for moderate pain.  11/24/14  Yes [name], MD  vitamin B-12 (CYANOCOBALAMIN) 1000 MCG tablet Take 1,000 mcg by mouth daily.   Yes Historical Provider, MD  vitamin C (ASCORBIC ACID) 500 MG tablet Take 500 mg by mouth daily.   Yes Historical Provider, MD  vitamin E 400 UNIT capsule Take 400 Units by mouth daily.   Yes Historical Provider, MD  Zinc 100 MG TABS Take 1 tablet by mouth daily.     Yes Historical Provider, MD  zolpidem (AMBIEN) 10 MG tablet TAKE ONE TABLET AT BEDTIME AS NEEDED FORSLEEP Patient taking differently: Take 10 mg by mouth at bedtime as needed for sleep.  04/10/14  Yes [name], MD  omeprazole (PRILOSEC) 40 MG capsule Take 1 capsule (40 mg total) by mouth daily. Patient not taking: Reported on 01/05/2015 10/04/14   Jerene Bears, MD    Inpatient Medications:  . aspirin EC  325 mg Oral Daily  . calcium-vitamin D  1 tablet Oral Daily  . enoxaparin (LOVENOX) injection  40 mg Subcutaneous Q24H  . metoprolol succinate  25 mg Oral Daily  . pantoprazole  40 mg Oral Daily  . simvastatin  20 mg Oral QHS  . sodium chloride  3 mL Intravenous Q12H  . sodium chloride  3 mL Intravenous Q12H  . vitamin B-12  1,000 mcg Oral Daily  . vitamin C  500 mg Oral Daily  . vitamin E  400 Units Oral Daily  . zinc sulfate   Oral Daily      Allergies: No Known Allergies  History   Social History  . Marital Status: Single    Spouse Name: N/A  . Number of Children: N/A  . Years of Education: 12   Occupational History  . retired     [employer]   Social History Main Topics  . Smoking status: Former Smoker -- 2.50 packs/day for 40 years    Types: Cigarettes    Quit date: 03/24/2000  . Smokeless tobacco: Never Used  . Alcohol Use: 6.0 oz/week    10 Shots of liquor per week     Comment: occasional  . Drug Use: No  . Sexual Activity: Not on file    Other Topics Concern  . Not on file   Social History Narrative   Singled; retired, part time; gets regular exercise.    Caffeine Use-yes           Family History  Problem Relation Age of Onset  . Hypertension Mother   . Heart disease Mother   . Hypertension Father   . Diabetes Father   .  Cancer Paternal Grandfather   . Heart disease Brother 30  . Colon cancer Neg Hx      Review of Systems: Review of Systems  Constitutional: Positive for malaise/fatigue and diaphoresis. Negative for fever, chills and weight loss.  HENT: Negative for congestion.   Eyes: Negative for blurred vision, discharge and redness.  Respiratory: Negative for cough, hemoptysis, sputum production, shortness of breath and wheezing.   Cardiovascular: Positive for chest pain and palpitations. Negative for orthopnea, claudication, leg swelling and PND.  Gastrointestinal: Negative for heartburn, nausea, vomiting and abdominal pain.  Musculoskeletal: Negative for myalgias and falls.  Skin: Negative for rash.  Neurological: Positive for weakness. Negative for dizziness, sensory change, speech change, focal weakness and headaches.  Endo/Heme/Allergies: Does not bruise/bleed easily.  Psychiatric/Behavioral: Negative for substance abuse. The patient is not nervous/anxious.   All other systems reviewed and are negative.    Labs:  Recent Labs  01/05/15 1758 01/05/15 2157 01/06/15 0304  TROPONINI <0.03 <0.03 <0.03   Lab Results  Component Value Date   WBC 9.5 01/05/2015   HGB 15.5 01/05/2015   HCT 45.9 01/05/2015   MCV 89.4 01/05/2015   PLT 193 01/05/2015    Recent Labs Lab 01/05/15 1758  NA 135  K 3.8  CL 102  CO2 22  BUN 32*  CREATININE 1.08  CALCIUM 9.1  GLUCOSE 111*   Lab Results  Component Value Date   CHOL 144 11/24/2014   HDL 46.90 11/24/2014   LDLCALC 83 11/24/2014   TRIG 71.0 11/24/2014   No results found for: DDIMER  Radiology/Studies:  Dg Chest 2 View  01/05/2015    CLINICAL DATA:  Chest pain and left arm pain. Heart fluttering since last night with diaphoresis. Previous heart attack 2001.  EXAM: CHEST  2 VIEW  COMPARISON:  11/02/2014  FINDINGS: Lungs are adequately inflated without consolidation or effusion. Cardiomediastinal silhouette is within normal. There are mild degenerative changes of the spine. Mild flattening of the hemidiaphragms.  IMPRESSION: No active cardiopulmonary disease.   Electronically Signed   By: Marin Olp M.D.   On: 01/05/2015 18:19    EKG: NSR, 71 bpm, rare PVC, no significant st/t changes  Weights: Filed Weights   01/05/15 1749 01/05/15 2055  Weight: 210 lb (95.255 kg) 204 lb 14.4 oz (92.942 kg)     Physical Exam: Blood pressure 128/64, pulse 56, temperature 97.7 F (36.5 C), temperature source Oral, resp. rate 23, height '5\' 8"'$  (1.727 m), weight 204 lb 14.4 oz (92.942 kg), SpO2 93 %. Body mass index is 31.16 kg/(m^2). General: Well developed, well nourished, in no acute distress. Head: Normocephalic, atraumatic, sclera non-icteric, no xanthomas, nares are without discharge.  Neck: Negative for carotid bruits. JVD not elevated. Lungs: Clear bilaterally to auscultation without wheezes, rales, or rhonchi. Breathing is unlabored. Heart: RRR with S1 S2. No murmurs, rubs, or gallops appreciated. Abdomen: Soft, non-tender, non-distended with normoactive bowel sounds. No hepatomegaly. No rebound/guarding. No obvious abdominal masses. Msk:  Strength and tone appear normal for age. Extremities: No clubbing or cyanosis. No edema.  Distal pedal pulses are 2+ and equal bilaterally. Neuro: Alert and oriented X 3. No facial asymmetry. No focal deficit. Moves all extremities spontaneously. Psych:  Responds to questions appropriately with a normal affect.    Assessment and Plan:  74 y.o. male with h/o CAD s/p remote MI s/p remote stenting to RCA in 03/2000, HTN, HLD, and skin CA who presented to [facility] on 7/30 with several month history of  intermittent  chest pain that was worse on 7/29 while at work.  1. Unstable angina/CAD s/p remote MI and remote stenting as above: -Patient with continued chest pain at this time -Recent nuclear stress test 08/15/2014 that was low risk, now with continued intermittent exertional and non-exertional symptoms -Plan to get patient chest pain free today -Symptoms are similar to his prior MI in 2001 -Given he is continuing to have symptoms would need to pursue further with cardiac catheterization. Given his troponins are negative x 4 and EKG is non-acute this can likely be done as an outpatient and he would prefer to do this as an outpatient (once he is chest pain free) -If he is not able to be chest pain free at rest and with ambulation would need keep inpatient for cardiac cath first of the week -Add Imdur 15 mg daily -Continue aspirin, Toprol 25 mg , simvastatin 20 mg  2. HTN: -Add Imdur as above -Continue Toprol  3. HLD: -Continue simvastatin    Signed, Christell Faith, PA-C Pager: [phone] 01/06/2015, 9:06 AM    Attending Note Patient seen and examined, agree with detailed note above,  Patient presentation and plan discussed on rounds.   Long discussion with Mr. Brobeck concerning his symptoms. He started lifting weights earlier in the week after taking several weeks off Developed some fasciculations in his left chest, somewhat atypical discomfort starting Thursday night into Friday He is concerned about his symptoms and presented to the hospital Also reports having some chest pain previously when raking mulch also again when working on his pool over the past several weeks In the past has had some atypical chest pain managed medically. Prior workup showing patent stents, negative stress testing not too long ago. He is concerned about his symptoms and would like to arrange outpatient cardiac catheterization given his concern for stable angina. Cardiac enzymes negative 3, EKG  unchanged showing no signs of ischemia After long discussion, he prefers to go home and schedule catheterization later. He does have tramadol for pain also has nitroglycerin for angina.  --We will call him on Monday to arrange outpatient cardiac catheterization Recommended he call us if he has continued symptoms of angina   Signed: Esmond Plants  M.D., Ph.D. [facility]

## 2015-01-08 ENCOUNTER — Telehealth: Payer: Self-pay

## 2015-01-08 ENCOUNTER — Telehealth: Payer: Self-pay | Admitting: Cardiovascular Disease

## 2015-01-08 DIAGNOSIS — R079 Chest pain, unspecified: Secondary | ICD-10-CM

## 2015-01-08 DIAGNOSIS — Z01812 Encounter for preprocedural laboratory examination: Secondary | ICD-10-CM

## 2015-01-08 NOTE — Telephone Encounter (Signed)
Forward to [facility]

## 2015-01-08 NOTE — Telephone Encounter (Signed)
Patient was d/c from [facility] 01/06/15 (sat) for Chest Pain.  Per patient [name] said he should schedule a cath outpatient and fu with [facility] after.  Patient concerned that prime doc rx Imdur 30 mg 2 daily and he wants to make sure [name] is aware and ok before he starts taking.

## 2015-01-08 NOTE — Telephone Encounter (Signed)
Transition Care Management Follow-up Telephone Call   Date discharged? 01/06/15   How have you been since you were released from the hospital? Doing ok. Denies SOB. Denies pain.   Do you understand why you were in the hospital? Yes, I thought I was having a heart attack.   Do you understand the discharge instructions? Yes, relax and follow up with appointments.   Where were you discharged to? Home   Items Reviewed:  Medications reviewed: Yes  Allergies reviewed: Yes  Dietary changes reviewed: Yes  Referrals reviewed: Yes   Functional Questionnaire:   Activities of Daily Living (ADLs):   He states they are independent in the following: Independent in all ADLs. States they require assistance with the following: Does not require assistance at this time.   Any transportation issues/concerns?: No, drives self.   Any patient concerns? No, awaiting heart cath.   Confirmed importance and date/time of follow-up visits scheduled Yes. Provider appointment on 01/11/15.  Awaiting call for Cardiologist follow up appointment.  Provider Appointment booked with Dr. Derrel Nip (PCP).  Confirmed with patient if condition begins to worsen call PCP or go to the ER.  Patient was given the office number and encouraged to call back with question or concerns.  : Yes, patient verbalized understanding.

## 2015-01-09 NOTE — Telephone Encounter (Signed)
Would ask if August 11 or 12th ,  first or second case would work for him

## 2015-01-09 NOTE — Telephone Encounter (Signed)
Spoke w/ pt.  He would prefer 8/11 if possible.  Advised him that it is after hours, I will call him in the am w/ a date and time.

## 2015-01-10 NOTE — Telephone Encounter (Signed)
Spoke w/ pt.  Advised him that he is scheduled for cardiac cath on 8/11 @ 10:30, [place] [facility] @ 9:30. Advised him that he needs to come in for pre-procedure labs before that time and go over instructions.  He states that he is in the drive-thru line at [facility] and will call back to set this up.

## 2015-01-11 ENCOUNTER — Encounter: Payer: Self-pay | Admitting: Internal Medicine

## 2015-01-11 ENCOUNTER — Ambulatory Visit (INDEPENDENT_AMBULATORY_CARE_PROVIDER_SITE_OTHER): Payer: PPO | Admitting: Internal Medicine

## 2015-01-11 VITALS — BP 146/84 | HR 55 | Temp 97.4°F | Resp 14 | Ht 69.0 in | Wt 211.5 lb

## 2015-01-11 DIAGNOSIS — G4733 Obstructive sleep apnea (adult) (pediatric): Secondary | ICD-10-CM

## 2015-01-11 DIAGNOSIS — R5381 Other malaise: Secondary | ICD-10-CM

## 2015-01-11 DIAGNOSIS — R5382 Chronic fatigue, unspecified: Secondary | ICD-10-CM

## 2015-01-11 DIAGNOSIS — L089 Local infection of the skin and subcutaneous tissue, unspecified: Secondary | ICD-10-CM

## 2015-01-11 DIAGNOSIS — I209 Angina pectoris, unspecified: Secondary | ICD-10-CM

## 2015-01-11 MED ORDER — SIMVASTATIN 20 MG PO TABS: 20.0000 mg | ORAL_TABLET | Freq: Every day | ORAL | Status: DC

## 2015-01-11 NOTE — Patient Instructions (Addendum)
Let me know if want a referral [facility] ankle/foot surgeon: Lennox Grumbles  Makes sure you are getting 2000 IUs of Vit D3  Daily   We will get your sleep study set up after your cardiac cath    Please take a probiotic ( Align, Floraque or Culturelle) or the generic version of one of these  For a minimum of 3 weeks to prevent a serious antibiotic associated diarrhea  Called clostridium dificile colitis

## 2015-01-11 NOTE — Progress Notes (Signed)
Pre-visit discussion using our clinic review tool. No additional management support is needed unless otherwise documented below in the visit note.  

## 2015-01-11 NOTE — Progress Notes (Signed)
Subjective:  Patient ID: Randall Ali., male    DOB: [birth year]  Age: 74 y.o. MRN: [id number]  CC: The primary encounter diagnosis was Ischemic chest pain. Diagnoses of OSA (obstructive sleep apnea) and Inflammation of toe were also pertinent to this visit.  HPI Randall Ali. presents for  1)  hospital follow up.  He was admitted to [facility] on July 29 with atypical chest pain radiating to left arm accompanied by diaphoresis. 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  The symptoms were reminiscent of his acute MI and were accompanied by a fluttering feeling. He ruled out for AMI , was seen by Esmond Plants , and was  discharged home on July 30th with plans for cardiac cath as outpatient.   2)  cc today is persistent foot pain,  Which has been persistent despite recent  Removal of hardware by by Dr. Debby Bud.  He reports persistent warmth and swelling warm.  Was treated with antibiotics last week by the orthopedic's assistant. 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  He is wanting to get a second opinion from another orthopedist but does not want a referral at this time.   3) results of sleep study discussed .  Did not sleep well because he did not take his [place] with him for the study, because he did not anticipate having trouble sleeping.  Moderate to severe OSA with hypoxia 25% of the time was reported ,  And titration study needed,  He is willing  to do it but wants to wait until after the cardiac issues have been worked out.      Outpatient Prescriptions Prior to Visit  Medication Sig Dispense Refill  . aspirin 81 MG EC tablet Take 81 mg by mouth daily.      . Calcium-Vitamin D (RA CALCIUM PLUS VITAMIN D) 600-125 MG-UNIT TABS Take 1 tablet by mouth daily.      . metoprolol succinate (TOPROL-XL) 25 MG 24 hr tablet Take 1  tablet (25 mg total) by mouth daily. 90 tablet 1  . nitroGLYCERIN (NITROSTAT) 0.4 MG SL tablet Place 1 tablet (0.4 mg total) under the tongue every 5 (five) minutes as needed for chest pain. 25 tablet 11  . omeprazole (PRILOSEC) 40 MG capsule Take 1 capsule (40 mg total) by mouth daily. 30 capsule 2  . tadalafil (CIALIS) 20 MG tablet Take 1 tablet (20 mg total) by mouth daily as needed for erectile dysfunction. 10 tablet 0  . traMADol (ULTRAM) 50 MG tablet Take 1 tablet (50 mg total) by mouth every 6 (six) hours as needed. (Patient taking differently: Take 50 mg by mouth every 6 (six) hours as needed for moderate pain. ) 60 tablet 5  . vitamin B-12 (CYANOCOBALAMIN) 1000 MCG tablet Take 1,000 mcg by mouth daily.    . vitamin C (ASCORBIC ACID) 500 MG tablet Take 500 mg by mouth daily.    . vitamin E 400 UNIT capsule Take 400 Units by mouth daily.    . Zinc 100 MG TABS Take 1 tablet by mouth daily.      [name] zolpidem (AMBIEN) 10 MG tablet TAKE ONE TABLET AT BEDTIME AS NEEDED FORSLEEP (Patient taking differently: Take 10 mg by mouth at bedtime as needed for sleep. ) 15  tablet 3  . simvastatin (ZOCOR) 20 MG tablet Take 20 mg by mouth at bedtime.    . isosorbide mononitrate (IMDUR) 30 MG 24 hr tablet Take 1 tablet (30 mg total) by mouth daily. (Patient not taking: Reported on 01/11/2015) 30 tablet 0   No facility-administered medications prior to visit.    Review of Systems;  Patient denies headache, fevers, malaise, unintentional weight loss, skin rash, eye pain, sinus congestion and sinus pain, sore throat, dysphagia,  hemoptysis , cough, dyspnea, wheezing, chest pain, palpitations, orthopnea, edema, abdominal pain, nausea, melena, diarrhea, constipation, flank pain, dysuria, hematuria, urinary  Frequency, nocturia, numbness, tingling, seizures,  Focal weakness, Loss of consciousness,  Tremor, insomnia, depression, anxiety, and suicidal ideation.      Objective:  BP 146/84 mmHg  Pulse 55  Temp(Src)  97.4 F (36.3 C) (Oral)  Resp 14  Ht '5\' 9"'$  (1.753 m)  Wt 211 lb 8 oz (95.936 kg)  BMI 31.22 kg/m2  SpO2 95%  BP Readings from Last 3 Encounters:  01/11/15 146/84  01/06/15 127/59  11/24/14 138/82    Wt Readings from Last 3 Encounters:  01/11/15 211 lb 8 oz (95.936 kg)  01/05/15 204 lb 14.4 oz (92.942 kg)  11/24/14 207 lb 8 oz (94.121 kg)    General appearance: alert, cooperative and appears stated age Ears: normal TM's and external ear canals both ears Throat: lips, mucosa, and tongue normal; teeth and gums normal Neck: no adenopathy, no carotid bruit, supple, symmetrical, trachea midline and thyroid not enlarged, symmetric, no tenderness/mass/nodules Back: symmetric, no curvature. ROM normal. No CVA tenderness. Lungs: clear to auscultation bilaterally Heart: regular rate and rhythm, S1, S2 normal, no murmur, click, rub or gallop Abdomen: soft, non-tender; bowel sounds normal; no masses,  no organomegaly Pulses: 2+ and symmetric Skin: Skin color, texture, turgor normal. No rashes or lesions Lymph nodes: Cervical, supraclavicular, and axillary nodes normal.  No results found for: HGBA1C  Lab Results  Component Value Date   CREATININE 1.08 01/05/2015   CREATININE 0.99 11/24/2014   CREATININE 1.07 11/02/2014    Lab Results  Component Value Date   WBC 9.5 01/05/2015   HGB 15.5 01/05/2015   HCT 45.9 01/05/2015   PLT 193 01/05/2015   GLUCOSE 111* 01/05/2015   CHOL 144 11/24/2014   TRIG 71.0 11/24/2014   HDL 46.90 11/24/2014   LDLDIRECT 78.0 04/05/2013   LDLCALC 83 11/24/2014   ALT 16 11/24/2014   AST 15 11/24/2014   NA 135 01/05/2015   K 3.8 01/05/2015   CL 102 01/05/2015   CREATININE 1.08 01/05/2015   BUN 32* 01/05/2015   CO2 22 01/05/2015   TSH 1.06 11/24/2014   PSA 0.28 11/24/2014   INR 1.0 06/13/2011    Dg Chest 2 View  01/05/2015   CLINICAL DATA:  Chest pain and left arm pain. Heart fluttering since last night with diaphoresis. Previous heart attack  2001.  EXAM: CHEST  2 VIEW  COMPARISON:  11/02/2014  FINDINGS: Lungs are adequately inflated without consolidation or effusion. Cardiomediastinal silhouette is within normal. There are mild degenerative changes of the spine. Mild flattening of the hemidiaphragms.  IMPRESSION: No active cardiopulmonary disease.   Electronically Signed   By: Marin Olp M.D.   On: 01/05/2015 18:19    Assessment & Plan:   Problem List Items Addressed This Visit      Unprioritized   Inflammation of toe    S/p removal of hardware, with some drainage at hospital follow up treated with  empiric antibotics.        Chest pain - Primary    His pain is atypical, and he ruled out for AMI, but given his history of CAD with AMI, he needs a cardiac catheterization., which is scheduled.  Continue low dose Imdur,  Prn NTG       OSA (obstructive sleep apnea)    By sleep tudy July 2016.  Titration study needed,       Relevant Orders   Nocturnal polysomnography (NPSG)      I have changed Mr. Oleski simvastatin. I am also having him maintain his aspirin, Calcium-Vitamin D, Zinc, tadalafil, metoprolol succinate, zolpidem, nitroGLYCERIN, omeprazole, traMADol, vitamin B-12, vitamin C, vitamin E, and isosorbide mononitrate.  Meds ordered this encounter  Medications  . simvastatin (ZOCOR) 20 MG tablet    Sig: Take 1 tablet (20 mg total) by mouth at bedtime.    Dispense:  90 tablet    Refill:  1   A total of 40 minutes was spent with patient more than half of which was spent in counseling patient on the above mentioned issues , reviewing and explaining recent labs and imaging studies done, and coordination of care.  Medications Discontinued During This Encounter  Medication Reason  . simvastatin (ZOCOR) 20 MG tablet Reorder    Follow-up: Return in about 3 months (around 04/13/2015).   [name], MD

## 2015-01-13 ENCOUNTER — Encounter: Payer: Self-pay | Admitting: Internal Medicine

## 2015-01-13 DIAGNOSIS — G4733 Obstructive sleep apnea (adult) (pediatric): Secondary | ICD-10-CM | POA: Insufficient documentation

## 2015-01-13 NOTE — Assessment & Plan Note (Signed)
His pain is atypical, and he ruled out for AMI, but given his history of CAD with AMI, he needs a cardiac catheterization., which is scheduled.  Continue low dose Imdur,  Prn NTG

## 2015-01-13 NOTE — Assessment & Plan Note (Addendum)
By sleep tudy July 2016, with 25% of study time spend with O2 sats < 90% .  Mild to moderately severe,   Titration study needed,

## 2015-01-13 NOTE — Assessment & Plan Note (Signed)
Contributors include OSA  recently diagnosed,and chronic pain.

## 2015-01-13 NOTE — Assessment & Plan Note (Signed)
S/p removal of hardware, with some drainage at hospital follow up treated with empiric antibotics.

## 2015-01-15 ENCOUNTER — Encounter: Payer: Self-pay | Admitting: Internal Medicine

## 2015-01-15 ENCOUNTER — Other Ambulatory Visit (INDEPENDENT_AMBULATORY_CARE_PROVIDER_SITE_OTHER): Payer: PPO | Admitting: *Deleted

## 2015-01-15 DIAGNOSIS — Z01812 Encounter for preprocedural laboratory examination: Secondary | ICD-10-CM

## 2015-01-15 DIAGNOSIS — R079 Chest pain, unspecified: Secondary | ICD-10-CM | POA: Diagnosis not present

## 2015-01-15 NOTE — Progress Notes (Signed)
Faxed OV note 01/10/15 to MD , Insurance and order to [facility].

## 2015-01-16 LAB — BASIC METABOLIC PANEL
BUN/Creatinine Ratio: 16 (ref 10–22)
BUN: 16 mg/dL (ref 8–27)
CO2: 21 mmol/L (ref 18–29)
Calcium: 9.2 mg/dL (ref 8.6–10.2)
Chloride: 102 mmol/L (ref 97–108)
Creatinine, Ser: 0.98 mg/dL (ref 0.76–1.27)
GFR calc Af Amer: 89 mL/min/{1.73_m2} (ref >59)
GFR calc non Af Amer: 77 mL/min/{1.73_m2} (ref >59)
Glucose: 94 mg/dL (ref 65–99)
Potassium: 4.6 mmol/L (ref 3.5–5.2)
Sodium: 138 mmol/L (ref 134–144)

## 2015-01-16 LAB — CBC WITH DIFFERENTIAL/PLATELET
Basophils Absolute: 0 10*3/uL (ref 0.0–0.2)
Basos: 0 %
EOS (ABSOLUTE): 0.2 10*3/uL (ref 0.0–0.4)
Eos: 2 %
Hematocrit: 44.2 % (ref 37.5–51.0)
Hemoglobin: 14.8 g/dL (ref 12.6–17.7)
Immature Grans (Abs): 0 10*3/uL (ref 0.0–0.1)
Immature Granulocytes: 0 %
Lymphocytes Absolute: 1.9 10*3/uL (ref 0.7–3.1)
Lymphs: 23 %
MCH: 30.6 pg (ref 26.6–33.0)
MCHC: 33.5 g/dL (ref 31.5–35.7)
MCV: 91 fL (ref 79–97)
Monocytes Absolute: 0.8 10*3/uL (ref 0.1–0.9)
Monocytes: 10 %
Neutrophils Absolute: 5.2 10*3/uL (ref 1.4–7.0)
Neutrophils: 65 %
Platelets: 207 10*3/uL (ref 150–379)
RBC: 4.84 x10E6/uL (ref 4.14–5.80)
RDW: 12.9 % (ref 12.3–15.4)
WBC: 8.1 10*3/uL (ref 3.4–10.8)

## 2015-01-16 LAB — PROTIME-INR
INR: 1.1 (ref 0.8–1.2)
Prothrombin Time: 10.9 s (ref 9.1–12.0)

## 2015-01-17 ENCOUNTER — Other Ambulatory Visit: Payer: Self-pay | Admitting: Cardiovascular Disease

## 2015-01-17 DIAGNOSIS — I209 Angina pectoris, unspecified: Secondary | ICD-10-CM

## 2015-01-18 ENCOUNTER — Encounter
Admission: RE | Disposition: A | Discharge status: Home / Self Care | Payer: Self-pay | Source: Ambulatory Visit | Attending: Cardiovascular Disease

## 2015-01-18 ENCOUNTER — Encounter: Payer: Self-pay | Admitting: *Deleted

## 2015-01-18 ENCOUNTER — Other Ambulatory Visit: Payer: Self-pay

## 2015-01-18 ENCOUNTER — Ambulatory Visit
Admission: RE | Admit: 2015-01-18 | Discharge: 2015-01-18 | Disposition: A | Discharge status: Home / Self Care | Payer: PPO | Source: Ambulatory Visit | Attending: Cardiovascular Disease | Admitting: Cardiovascular Disease

## 2015-01-18 DIAGNOSIS — Z833 Family history of diabetes mellitus: Secondary | ICD-10-CM | POA: Insufficient documentation

## 2015-01-18 DIAGNOSIS — R002 Palpitations: Secondary | ICD-10-CM | POA: Diagnosis not present

## 2015-01-18 DIAGNOSIS — I10 Essential (primary) hypertension: Secondary | ICD-10-CM | POA: Diagnosis not present

## 2015-01-18 DIAGNOSIS — Z951 Presence of aortocoronary bypass graft: Secondary | ICD-10-CM

## 2015-01-18 DIAGNOSIS — Z9849 Cataract extraction status, unspecified eye: Secondary | ICD-10-CM | POA: Diagnosis not present

## 2015-01-18 DIAGNOSIS — Z79899 Other long term (current) drug therapy: Secondary | ICD-10-CM | POA: Diagnosis not present

## 2015-01-18 DIAGNOSIS — Z955 Presence of coronary angioplasty implant and graft: Secondary | ICD-10-CM | POA: Insufficient documentation

## 2015-01-18 DIAGNOSIS — Z7982 Long term (current) use of aspirin: Secondary | ICD-10-CM | POA: Diagnosis not present

## 2015-01-18 DIAGNOSIS — K210 Gastro-esophageal reflux disease with esophagitis: Secondary | ICD-10-CM | POA: Insufficient documentation

## 2015-01-18 DIAGNOSIS — I252 Old myocardial infarction: Secondary | ICD-10-CM | POA: Insufficient documentation

## 2015-01-18 DIAGNOSIS — R0602 Shortness of breath: Secondary | ICD-10-CM | POA: Diagnosis not present

## 2015-01-18 DIAGNOSIS — I209 Angina pectoris, unspecified: Secondary | ICD-10-CM

## 2015-01-18 DIAGNOSIS — Z8249 Family history of ischemic heart disease and other diseases of the circulatory system: Secondary | ICD-10-CM | POA: Diagnosis not present

## 2015-01-18 DIAGNOSIS — Z809 Family history of malignant neoplasm, unspecified: Secondary | ICD-10-CM | POA: Insufficient documentation

## 2015-01-18 DIAGNOSIS — I2510 Atherosclerotic heart disease of native coronary artery without angina pectoris: Secondary | ICD-10-CM

## 2015-01-18 DIAGNOSIS — E785 Hyperlipidemia, unspecified: Secondary | ICD-10-CM | POA: Insufficient documentation

## 2015-01-18 DIAGNOSIS — R079 Chest pain, unspecified: Secondary | ICD-10-CM | POA: Diagnosis present

## 2015-01-18 DIAGNOSIS — Z87891 Personal history of nicotine dependence: Secondary | ICD-10-CM | POA: Diagnosis not present

## 2015-01-18 DIAGNOSIS — R61 Generalized hyperhidrosis: Secondary | ICD-10-CM | POA: Insufficient documentation

## 2015-01-18 DIAGNOSIS — I25110 Atherosclerotic heart disease of native coronary artery with unstable angina pectoris: Secondary | ICD-10-CM | POA: Diagnosis not present

## 2015-01-18 HISTORY — PX: CARDIAC CATHETERIZATION: SHX172

## 2015-01-18 HISTORY — DX: Male erectile dysfunction, unspecified: N52.9

## 2015-01-18 LAB — GLUCOSE, CAPILLARY: Glucose-Capillary: 184 mg/dL — ABNORMAL HIGH (ref 65–99)

## 2015-01-18 SURGERY — LEFT HEART CATH
Anesthesia: Moderate Sedation

## 2015-01-18 SURGERY — LEFT HEART CATH AND CORONARY ANGIOGRAPHY
Anesthesia: Moderate Sedation | Laterality: Bilateral

## 2015-01-18 MED ORDER — IOHEXOL 300 MG/ML  SOLN
INTRAMUSCULAR | Status: DC | PRN
  Administered 2015-01-18: 100 mL via INTRA_ARTERIAL
  Administered 2015-01-18: 20 mL via INTRA_ARTERIAL
  Administered 2015-01-18: 30 mL via INTRA_ARTERIAL

## 2015-01-18 MED ORDER — HEPARIN SODIUM (PORCINE) 1000 UNIT/ML IJ SOLN
INTRAMUSCULAR | Status: DC | PRN
  Administered 2015-01-18: 6000 [IU] via INTRAVENOUS

## 2015-01-18 MED ORDER — MIDAZOLAM HCL 2 MG/2ML IJ SOLN
INTRAMUSCULAR | Status: DC | PRN
  Administered 2015-01-18: 1 mg via INTRAVENOUS
  Administered 2015-01-18 (×2): 0.5 mg via INTRAVENOUS

## 2015-01-18 MED ORDER — LIDOCAINE HCL (PF) 1 % IJ SOLN
INTRAMUSCULAR | Status: DC | PRN
  Administered 2015-01-18: 18 mL via INTRADERMAL

## 2015-01-18 MED ORDER — HEPARIN SODIUM (PORCINE) 1000 UNIT/ML IJ SOLN
INTRAMUSCULAR | Status: AC
Start: 2015-01-18 — End: 2015-01-18
  Filled 2015-01-18: qty 1

## 2015-01-18 MED ORDER — HEPARIN (PORCINE) IN NACL 2-0.9 UNIT/ML-% IJ SOLN
INTRAMUSCULAR | Status: AC
  Filled 2015-01-18: qty 1000

## 2015-01-18 MED ORDER — SODIUM CHLORIDE 0.9 % IV SOLN: 250.0000 mL | INTRAVENOUS | Status: DC | PRN

## 2015-01-18 MED ORDER — SODIUM CHLORIDE 0.9 % IJ SOLN: 3.0000 mL | Freq: Two times a day (BID) | INTRAMUSCULAR | Status: DC

## 2015-01-18 MED ORDER — SODIUM CHLORIDE 0.9 % WEIGHT BASED INFUSION
3.0000 mL/kg/h | INTRAVENOUS | Status: DC
  Administered 2015-01-18: 3 mL/kg/h via INTRAVENOUS

## 2015-01-18 MED ORDER — SODIUM CHLORIDE 0.9 % WEIGHT BASED INFUSION: 1.0000 mL/kg/h | INTRAVENOUS | Status: DC

## 2015-01-18 MED ORDER — SODIUM CHLORIDE 0.9 % WEIGHT BASED INFUSION: 3.0000 mL/kg/h | INTRAVENOUS | Status: AC

## 2015-01-18 MED ORDER — FENTANYL CITRATE (PF) 100 MCG/2ML IJ SOLN
INTRAMUSCULAR | Status: DC | PRN
  Administered 2015-01-18 (×2): 25 ug via INTRAVENOUS
  Administered 2015-01-18: 50 ug via INTRAVENOUS

## 2015-01-18 MED ORDER — MIDAZOLAM HCL 2 MG/2ML IJ SOLN
INTRAMUSCULAR | Status: AC
  Filled 2015-01-18: qty 2

## 2015-01-18 MED ORDER — FENTANYL CITRATE (PF) 100 MCG/2ML IJ SOLN
INTRAMUSCULAR | Status: AC
  Filled 2015-01-18: qty 2

## 2015-01-18 MED ORDER — ADENOSINE (DIAGNOSTIC) 3 MG/ML IV SOLN
INTRAVENOUS | Status: AC
  Filled 2015-01-18: qty 30

## 2015-01-18 MED ORDER — ASPIRIN 81 MG PO CHEW: 81.0000 mg | CHEWABLE_TABLET | ORAL | Status: DC

## 2015-01-18 MED ORDER — SODIUM CHLORIDE 0.9 % IJ SOLN: 3.0000 mL | INTRAMUSCULAR | Status: DC | PRN

## 2015-01-18 SURGICAL SUPPLY — 13 items
CATH INFINITI 5FR ANG PIGTAIL (CATHETERS) ×2 IMPLANT
CATH INFINITI 5FR JL4 (CATHETERS) ×2 IMPLANT
CATH INFINITI JR4 5F (CATHETERS) ×2 IMPLANT
DEVICE CLOSURE MYNXGRIP 5F (Vascular Products) ×1 IMPLANT
KIT MANI 3VAL PERCEP (MISCELLANEOUS) ×2 IMPLANT
NDL PERC 18GX7CM (NEEDLE) ×1 IMPLANT
NEEDLE PERC 18GX7CM (NEEDLE) ×2 IMPLANT
NEEDLE SMART 18G ACCESS (NEEDLE) IMPLANT
PACK CARDIAC CATH (CUSTOM PROCEDURE TRAY) ×2 IMPLANT
SHEATH AVANTI 5FR X 11CM (SHEATH) ×2 IMPLANT
VALVE COPILOT STAT (MISCELLANEOUS) ×1 IMPLANT
WIRE EMERALD 3MM-J .035X150CM (WIRE) ×2 IMPLANT
WIRE PRESSURE VERRATA (WIRE) ×1 IMPLANT

## 2015-01-19 ENCOUNTER — Other Ambulatory Visit: Payer: Self-pay | Admitting: *Deleted

## 2015-01-19 ENCOUNTER — Encounter: Payer: Self-pay | Admitting: Cardiovascular Disease

## 2015-01-19 ENCOUNTER — Institutional Professional Consult (permissible substitution) (INDEPENDENT_AMBULATORY_CARE_PROVIDER_SITE_OTHER): Payer: PPO | Admitting: Cardiothoracic Surgery

## 2015-01-19 VITALS — BP 137/72 | HR 66 | Resp 20 | Ht 69.0 in | Wt 211.0 lb

## 2015-01-19 DIAGNOSIS — I2510 Atherosclerotic heart disease of native coronary artery without angina pectoris: Secondary | ICD-10-CM

## 2015-01-19 DIAGNOSIS — R911 Solitary pulmonary nodule: Secondary | ICD-10-CM

## 2015-01-19 DIAGNOSIS — I25110 Atherosclerotic heart disease of native coronary artery with unstable angina pectoris: Secondary | ICD-10-CM | POA: Diagnosis not present

## 2015-01-19 DIAGNOSIS — R079 Chest pain, unspecified: Secondary | ICD-10-CM

## 2015-01-19 NOTE — Pre-Procedure Instructions (Signed)
    Randall Ali.  01/19/2015      [facility] [place], [place] - [place] [address] [place] [address] Phone: [phone] Fax: [phone]    Your procedure is scheduled on Wednesday, January 24, 2015   Report to [facility] Admitting at 6:30 A.M.   Call this number if you have problems the morning of surgery:  [phone]   Remember:  Do not eat food or drink liquids after midnight Tuesday, January 23, 2015  Take these medicines the morning of surgery with A SIP OF WATER:metoprolol succinate (TOPROL-XL), if needed:nitroGLYCERIN (NITROSTAT) for chest pain, traMADol (ULTRAM) for pain  Stop taking  vitamins and herbal medications. Do not take any NSAIDs ie: Ibuprofen, Advil, Naproxen, BC's, Goody's and etc.; stop now.  Do not wear jewelry   Do not wear lotions, powders, or perfumes.  You may not wear deodorant.    Men may shave face and neck.   Do not bring valuables to the hospital.   [facility] is not responsible for any belongings or valuables.  Contacts, dentures or bridgework may not be worn into surgery.  Leave your suitcase in the car.  After surgery it may be brought to your room.  For patients admitted to the hospital, discharge time will be determined by your treatment team.  Patients discharged the day of surgery will not be allowed to drive home.   Name and phone number of your driver:  Special instructions: Shower the night before surgery and the morning of surgery using CHG.  Please read over the following fact sheets that you were given. Pain Booklet, Coughing and Deep Breathing, Blood Transfusion Information, Open Heart Packet, MRSA Information and Surgical Site Infection Prevention

## 2015-01-19 NOTE — Patient Instructions (Signed)
Stop advil, vit E .Coronary artery disease  Coronary Artery Bypass Grafting Coronary artery bypass grafting (CABG) is a procedure done to bypass or fix arteries of the heart (coronary arteries) that have become narrow or blocked. This narrowing is usually the result of plaque that has built up in the walls of the vessels. The coronary arteries supply the heart with the oxygen and nutrients it needs to pump blood to your body. In the CABG procedure, a section of blood vessel from another part of the body (usually the leg, arm, or chest wall) is removed and then inserted where it will allow blood to bypass the damaged part of the coronary artery.  LET [facility] CARE PROVIDER KNOW ABOUT:  Any allergies you have.   All medicines you are taking, including blood thinners, vitamins, herbs, eye drops, creams, and over-the-counter medicines.   Use of steroids (by mouth or creams).   Previous problems you or members of your family have had with the use of anesthetics.   Any blood disorders you have.   Previous surgeries you have had.   Medical conditions you have.  RISKS AND COMPLICATIONS Generally, this is a safe procedure. However, problems can occur and include:   Blood loss.   Stroke.   Infection.   Pain at the surgical site.   Heart attack during or after surgery.   Kidney failure.  BEFORE THE PROCEDURE  Take medicines only as directed by your health care provider. You may be asked to start new medicines and stop taking others. Do not stop medicines or adjust dosages on your own.  Do not eat or drink anything after midnight the night before the procedure or as directed by your health care provider. Ask your health care provider if it is okay to take a sip of water with any needed medicines. PROCEDURE The surgeon may use either an open technique or a minimally invasive technique for this surgery. Traditional open surgery  You will be given medicine to make you  sleep through the procedure (general anesthetic).  Once you are asleep, a cut (incision) will be made down the front of the chest through the breastbone (sternum). The sternum will be spread open so your heart can be seen.  You will then be placed on a heart-lung bypass machine. This machine will provide oxygen to your blood while the heart is undergoing surgery.  Your heart will then be temporarily stopped so that the surgeon can do the next steps.  A section of vein will likely be taken from your leg and used to bypass the blocked arteries of your heart. Sometimes parts of an artery from inside your chest wall or from your arm will be used, either alone or in combination with leg veins.  When the bypasses are done, you will be taken off the machine.  Your heart will be restarted and will take over again normally.  The sac around the heart will be closed.  Your chest will then be closed with stitches or staples.  Tubes will remain in your chest and will be connected to a suction device in order to help drain fluid and reinflate the lungs. Minimally invasive surgery This technique is done from an incision over your left chest area. If appropriate, your surgeon may not have to slow or stop your heart. If your condition allows for this procedure, there will often be less blood loss, less pain, a shorter hospital stay, and faster recovery compared to traditional open surgery. AFTER  THE PROCEDURE  You will be taken to a recovery area to be monitored.  You may wake up with a tube in your throat to help your breathing. You may be connected to a breathing machine. You will not be able to talk while the tube is in place. The tube will be taken out as soon as it is safe to do so.  You will be groggy and may have some pain. You will be given pain medicine to help control the pain. Document Released: 03/05/2005 Document Revised: 10/10/2013 Document Reviewed: 11/02/2012 [facility] Patient Information  2015 [place], [place]. This information is not intended to replace advice given to you by your health care provider. Make sure you discuss any questions you have with your health care provider. Coronary Artery Bypass Grafting, Care After Refer to this sheet in the next few weeks. These instructions provide you with information on caring for yourself after your procedure. Your health care provider may also give you more specific instructions. Your treatment has been planned according to current medical practices, but problems sometimes occur. Call your health care provider if you have any problems or questions after your procedure. WHAT TO EXPECT AFTER THE PROCEDURE Recovery from surgery will be different for everyone. Some people feel well after 3 or 4 weeks, while for others it takes longer. After your procedure, it is typical to have the following:  Nausea and a lack of appetite.   Constipation.  Weakness and fatigue.   Depression or irritability.   Pain or discomfort at your incision site. HOME CARE INSTRUCTIONS  Take medicines only as directed by your health care provider. Do not stop taking medicines or start any new medicines without first checking with your health care provider.  Take your pulse as directed by your health care provider.  Perform deep breathing as directed by your health care provider. If you were given a device called an incentive spirometer, use it to practice deep breathing several times a day. Support your chest with a pillow or your arms when you take deep breaths or cough.  Keep incision areas clean, dry, and protected. Remove or change any bandages (dressings) only as directed by your health care provider. You may have skin adhesive strips over the incision areas. Do not take the strips off. They will fall off on their own.  Check incision areas daily for any swelling, redness, or drainage.  If incisions were made in your legs, do the following:  Avoid  crossing your legs.   Avoid sitting for long periods of time. Change positions every 30 minutes.   Elevate your legs when you are sitting.  Wear compression stockings as directed by your health care provider. These stockings help keep blood clots from forming in your legs.  Take showers once your health care provider approves. Until then, only take sponge baths. Pat incisions dry. Do not rub incisions with a washcloth or towel. Do not take baths, swim, or use a hot tub until your health care provider approves.  Eat foods that are high in fiber, such as raw fruits and vegetables, whole grains, beans, and nuts. Meats should be lean cut. Avoid canned, processed, and fried foods.  Drink enough fluid to keep your urine clear or pale yellow.  Weigh yourself every day. This helps identify if you are retaining fluid that may make your heart and lungs work harder.  Rest and limit activity as directed by your health care provider. You may be instructed to:  Stop  any activity at once if you have chest pain, shortness of breath, irregular heartbeats, or dizziness. Get help right away if you have any of these symptoms.  Move around frequently for short periods or take short walks as directed by your health care provider. Increase your activities gradually. You may need physical therapy or cardiac rehabilitation to help strengthen your muscles and build your endurance.  Avoid lifting, pushing, or pulling anything heavier than 10 lb (4.5 kg) for at least 6 weeks after surgery.  Do not drive until your health care provider approves.  Ask your health care provider when you may return to work.  Ask your health care provider when you may resume sexual activity.  Keep all follow-up visits as directed by your health care provider. This is important. SEEK MEDICAL CARE IF:  You have swelling, redness, increasing pain, or drainage at the site of an incision.  You have a fever.  You have swelling in  your ankles or legs.  You have pain in your legs.   You gain 2 or more pounds (0.9 kg) a day.  You are nauseous or vomit.  You have diarrhea. SEEK IMMEDIATE MEDICAL CARE IF:  You have chest pain that goes to your jaw or arms.  You have shortness of breath.   You have a fast or irregular heartbeat.   You notice a "clicking" in your breastbone (sternum) when you move.   You have numbness or weakness in your arms or legs.  You feel dizzy or light-headed.  MAKE SURE YOU:  Understand these instructions.  Will watch your condition.  Will get help right away if you are not doing well or get worse. Document Released: 12/13/2004 Document Revised: 10/10/2013 Document Reviewed: 11/02/2012 [facility] Patient Information 2015 [place], [place]. This information is not intended to replace advice given to you by your health care provider. Make sure you discuss any questions you have with your health care provider.

## 2015-01-19 NOTE — Progress Notes (Signed)
[address]       [place] [address]             Ashton Crite Jr. [place] Record #[id number] Date of Birth: 08/03/1940  Referring: Minna Merritts, MD Primary Care: [name], MD  Chief Complaint:    Chief Complaint  Patient presents with  . Coronary Artery Disease    Surgical eval, Cardiac Cath 01/18/15,    History of Present Illness:    Nigil Braman. 74 y.o. male is seen in the office  today for symptomatic CAD after cath yesterday.   Patient has history of  CAD s/p remote MI s/p remote stenting to RCA in 03/2000, HTN, HLD, and skin CA who presented to [facility] on 7/30 with several month history of intermittent chest pain that was worse on 7/29 while at work.    He has  been having some exertional chest pain back in the spring of 2016, therefore a nuclear stress test was performed that showed no significant ischemia, no significant wall motion abnormality was noted, no EKG changes were noted, EF estimated at 54%, overall this was a low risk scan. He has continued to have exertional chest pain, at times similar to his prior MI.   While at work (works at the [employer]) on 7/29 he developed sudden onset of substernal chest pain, palpitations, and diaphoresis. Symptoms lasted until his arrival to [facility] when his palpitations and diaphoresis resolved. No associated SOB, nausea, vomiting, presyncope, or syncope. Upon his arrival to [facility] his tropon was found to be negative x 4. CXR with no active cardiopulmonary disease. CBC unremarkable. BMP unremarkable. He was discharged home and returned yesterday for elective cardiac cath at [facility].       Current Activity/ Functional Status:  Patient is independent with mobility/ambulation, transfers, ADL's, IADL's.  mostly limited by chronic pain in his right foot from previous orthopedic surgery  Zubrod Score: At the time of surgery this patient's most  appropriate activity status/level should be described as: '[]'$     0    Normal activity, no symptoms '[x]'$     1    Restricted in physical strenuous activity but ambulatory, able to do out light work '[]'$     2    Ambulatory and capable of self care, unable to do work activities, up and about               >50 % of waking hours                              '[]'$     3    Only limited self care, in bed greater than 50% of waking hours '[]'$     4    Completely disabled, no self care, confined to bed or chair '[]'$     5    Moribund   Past Medical History  Diagnosis Date  . CAD (coronary artery disease)     s/p inferior wall infarct in 10/01. has stent in Rt coronary artery. is due a stress myoview. does have some dyspnea on exertion  . SOB (shortness of breath) on exertion   . 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 Chest pain   . HLD (hyperlipidemia)     isdue  followup lipids. on zocor 10 mg/day   . HTN (hypertension)   . Allergy     seasonal  . Cataract     removed  . Cancer     skin  . Reflux esophagitis   . OSA (obstructive sleep apnea) 01/13/2015  . Erectile dysfunction     Past Surgical History  Procedure Laterality Date  . Cardiac catheterization  06/24/11  . Carotid stent  03/10/2011  . Arm surgery  2010  . Shoulder arthroscopy  2012  . Wrist surgery  2011  . Toe surgery  1994  . Nasal sinus surgery  2008    septpolasty, bilateral turbinate reduction  . Hammer toe surgery      right toe  . Colonoscopy  2010  . Cardiac catheterization N/A 01/18/2015    Procedure: Left Heart Cath with coronary angiography;  Surgeon: Minna Merritts, MD;  Location: [facility];  Service: Cardiovascular;  Laterality: N/A;  . Cardiac catheterization N/A 01/18/2015    Procedure: Intravascular Pressure Wire/FFR Study;  Surgeon: [name], MD;  Location: [facility];  Service: Cardiovascular;  Laterality: N/A;    Family History  Problem Relation Age of Onset  .  Hypertension Mother   . Heart disease Mother   . Hypertension Father   . Diabetes Father   . Cancer Paternal Grandfather   . Heart disease Brother 49  . Colon cancer Neg Hx     Social History   Social History  . Marital Status: Single    Spouse Name: N/A  . Number of Children: N/A  . Years of Education: 12   Occupational History  . retired     [employer]   Social History Main Topics  . Smoking status: Former Smoker -- 2.50 packs/day for 40 years    Types: Cigarettes    Quit date: 03/24/2000  . Smokeless tobacco: Never Used  . Alcohol Use: 6.0 oz/week    10 Shots of liquor per week     Comment: occasional  . Drug Use: No  . Sexual Activity: Not on file   Other Topics Concern  . Not on file   Social History Narrative   Singled; retired, part time; gets regular exercise.    Caffeine Use-yes          History  Smoking status  . Former Smoker -- 2.50 packs/day for 40 years  . Types: Cigarettes  . Quit date: 03/24/2000  Smokeless tobacco  . Never Used    History  Alcohol Use  . 6.0 oz/week  . 10 Shots of liquor per week    Comment: occasional     No Known Allergies  Current Outpatient Prescriptions  Medication Sig Dispense Refill  . aspirin 81 MG EC tablet Take 81 mg by mouth daily.      . Calcium-Vitamin D (RA CALCIUM PLUS VITAMIN D) 600-125 MG-UNIT TABS Take 1 tablet by mouth daily.      . metoprolol succinate (TOPROL-XL) 25 MG 24 hr tablet Take 1 tablet (25 mg total) by mouth daily. 90 tablet 1  . nitroGLYCERIN (NITROSTAT) 0.4 MG SL tablet Place 1 tablet (0.4 mg total) under the tongue every 5 (five) minutes as needed for chest pain. 25 tablet 11  . simvastatin (ZOCOR) 20 MG tablet Take 1 tablet (20 mg total) by mouth at bedtime. 90 tablet 1  . tadalafil (CIALIS) 20 MG tablet Take 1 tablet (20 mg total) by mouth daily as needed for erectile dysfunction.  10 tablet 0  . traMADol (ULTRAM) 50 MG tablet Take 1 tablet (50 mg total) by mouth every 6 (six) hours  as needed. (Patient taking differently: Take 50 mg by mouth every 6 (six) hours as needed for moderate pain. ) 60 tablet 5  . vitamin B-12 (CYANOCOBALAMIN) 1000 MCG tablet Take 1,000 mcg by mouth daily.    . vitamin C (ASCORBIC ACID) 500 MG tablet Take 500 mg by mouth daily.    . vitamin E 400 UNIT capsule Take 400 Units by mouth daily.    . Zinc 100 MG TABS Take 1 tablet by mouth daily.      [name] zolpidem (AMBIEN) 10 MG tablet TAKE ONE TABLET AT BEDTIME AS NEEDED FORSLEEP (Patient taking differently: Take 10 mg by mouth at bedtime as needed for sleep. ) 15 tablet 3   No current facility-administered medications for this visit.      Review of Systems:     Cardiac Review of Systems: Y or N  Chest Pain [   y ]  Resting SOB [ n  ] Exertional SOB  Blue.Reese  ]  Orthopnea Blue.Reese  ]   Pedal Edema Florencio.Farrier   ]    Palpitations Blue.Reese  ] Syncope  [ n ]   Presyncope Florencio.Farrier   ]  General Review of Systems: [Y] = yes [  ]=no Constitional: recent weight change [  ];  Wt loss over the last 3 months [   ] anorexia [  ]; fatigue [  ]; nausea [  ]; night sweats [  ]; fever [  ]; or chills [  ];          Dental: poor dentition[ n ]; Last Dentist visit:   Eye : blurred vision [  ]; diplopia [   ]; vision changes [  ];  Amaurosis fugax[  ]; Resp: cough [  ];  wheezing[  ];  hemoptysis[  ]; shortness of breath[  ]; paroxysmal nocturnal dyspnea[  ]; dyspnea on exertion[  ]; or orthopnea[  ];  GI:  gallstones[  ], vomiting[  ];  dysphagia[  ]; melena[  ];  hematochezia [  ]; heartburn[  ];   Hx of  Colonoscopy[y  ]; GU: kidney stones [  ]; hematuria[  ];   dysuria [  ];  nocturia[  ];  history of     obstruction [  ]; urinary frequency [  ]             Skin: rash, swelling[  ];, hair loss[  ];  peripheral edema[  ];  or itching[  ]; Musculosketetal: myalgias[  ];  joint swelling[ y ];  joint erythema[ y ];  joint pain[  ];  back pain[  ];  Heme/Lymph: bruising[y  ];  bleeding[  ];  anemia[  ];  Neuro: TIA[  ];  headaches[  ];  stroke[  ];   vertigo[  ];  seizures[  ];   paresthesias[  ];  difficulty walking[ y ];  Psych:depression[  ]; anxiety[  ];  Endocrine: diabetes[n  ];  thyroid dysfunction[ n ];  Immunizations: Flu up to date [ y ]; Pneumococcal up to date [ y ];  Other:  Physical Exam: BP 137/72 mmHg  Pulse 66  Resp 20  Ht '5\' 9"'$  (1.753 m)  Wt 211 lb (95.709 kg)  BMI 31.15 kg/m2  SpO2 92%  PHYSICAL EXAMINATION: General appearance: alert, cooperative, appears stated age and no distress Head:  Normocephalic, without obvious abnormality, atraumatic Neck: no adenopathy, no carotid bruit, no JVD, supple, symmetrical, trachea midline and thyroid not enlarged, symmetric, no tenderness/mass/nodules Lymph nodes: Cervical, supraclavicular, and axillary nodes normal. Resp: clear to auscultation bilaterally Back: symmetric, no curvature. ROM normal. No CVA tenderness. Cardio: regular rate and rhythm, S1, S2 normal, no murmur, click, rub or gallop GI: soft, non-tender; bowel sounds normal; no masses,  no organomegaly Extremities: no edema, redness or tenderness in the calves or thighs and no ulcers, gangrene or trophic changes Neurologic: Grossly normal  Right groin cath site is without significant hematoma dressing is still in place. Patient has incisions on the dorsum of his right great and second toe see photograph Has mild pedal edema right greater than left PT and DP pulses are palpable bilaterally   Diagnostic Studies & Laboratory data:     Recent Radiology Findings:   Dg Chest 2 View  01/05/2015   CLINICAL DATA:  Chest pain and left arm pain. Heart fluttering since last night with diaphoresis. Previous heart attack 2001.  EXAM: CHEST  2 VIEW  COMPARISON:  11/02/2014  FINDINGS: Lungs are adequately inflated without consolidation or effusion. Cardiomediastinal silhouette is within normal. There are mild degenerative changes of the spine. Mild flattening of the hemidiaphragms.  IMPRESSION: No active cardiopulmonary  disease.   Electronically Signed   By: Marin Olp M.D.   On: 01/05/2015 18:19     I have independently reviewed the above radiologic studies.  Recent Lab Findings: Lab Results  Component Value Date   WBC 8.1 01/15/2015   HGB 15.5 01/05/2015   HCT 44.2 01/15/2015   PLT 193 01/05/2015   GLUCOSE 94 01/15/2015   CHOL 144 11/24/2014   TRIG 71.0 11/24/2014   HDL 46.90 11/24/2014   LDLDIRECT 78.0 04/05/2013   LDLCALC 83 11/24/2014   ALT 16 11/24/2014   AST 15 11/24/2014   NA 138 01/15/2015   K 4.6 01/15/2015   CL 102 01/15/2015   CREATININE 0.98 01/15/2015   BUN 16 01/15/2015   CO2 21 01/15/2015   TSH 1.06 11/24/2014   INR 1.1 01/15/2015   CATH:  Procedural Findings:  Coronary angiography:  Coronary dominance: Right Small caliber vessels particularly the proximal LAD, proximal circumflex  Left mainstem:  Large vessel that bifurcates into the left circumflex and LAD. No significant stenoses noted  Left anterior descending (LAD):  Large vessel that extends to the apical region, one large diagonal branch. Severe ostial LAD disease estimated at 70-80%. FFR of this lesion 0.69 per Dr. Fletcher Anon.  Left circumflex (LCx):  large size vessel with 2 large high OM's, both with 70% lesions OM1 with lesion in the mid section, OM 2 with proximal and mid lesions. .   Right coronary artery (RCA): Large vessel with patent ostial and proximal stent, mild in-stent restenosis estimated at 30% in the mid RCA, also with 70% distal RCA lesion  Left ventriculography: Left ventricular systolic function is normal, LVEF is estimated at 55-65%, there is no significant mitral regurgitation Or aortic valve stenosis  There were no immediate complications during the procedure.  There were no immediate complications during the procedure.    Conclusion     Ost LAD lesion, 80% stenosed.  Dist RCA lesion, 70% stenosed.  Mid RCA lesion, 30% stenosed. The lesion was previously treated with a stent  (unknown type) .  1st Mrg lesion, 70% stenosed.  2nd Mrg-2 lesion, 70% stenosed.  2nd Mrg-1 lesion, 70% stenosed.  The left ventricular systolic  function is normal.  Final Conclusions:  Right dominant coronary system Severe ostial LAD disease, moderate to severe OM1 and OM 2 as well as RCA disease.  Recommendations:  Case was discussed with Dr. Fletcher Anon. FFR on the ostial LAD was performed with 0.69 measured in dictating significant ostial LAD lesion. Given the circumflex disease, RCA disease, hemodynamically significant ostial LAD lesion, no intervention was performed, he'll be referred for evaluation for CABG in [place]. Appointment will be made for him.  [name] 01/18/2015, 2:30 PM        Assessment / Plan:   #1 symptomatic 3 vessel coronary artery disease- I reviewed the films with the patient and his daughter and agree with the recommendation to proceed with coronary artery bypass graft. The risks and options are discussed with the patient in detail and he is willing to proceed. Tablet plan to proceed with coronary artery bypass grafting on Wednesday, August 17.  #2 history of right pulmonary nodule- stable for several years but no CT scan since December 2014- will repeat CT  of the chest without contrast prior to surgery      I  spent 40 minutes counseling the patient face to face and 50% or more the  time was spent in counseling and coordination of care. The total time spent in the appointment was 60 minutes.  [name] MD      [address] [place], [address] Office [phone]   Beeper [phone]  01/19/2015 11:09 AM

## 2015-01-22 ENCOUNTER — Encounter (HOSPITAL_COMMUNITY)
Admission: RE | Admit: 2015-01-22 | Discharge: 2015-01-22 | Disposition: A | Discharge status: Home / Self Care | Payer: PPO | Source: Ambulatory Visit | Attending: Cardiothoracic Surgery | Admitting: Cardiothoracic Surgery

## 2015-01-22 ENCOUNTER — Encounter (HOSPITAL_COMMUNITY): Payer: PPO

## 2015-01-22 ENCOUNTER — Ambulatory Visit (HOSPITAL_COMMUNITY)
Admission: RE | Admit: 2015-01-22 | Discharge: 2015-01-22 | Disposition: A | Discharge status: Home / Self Care | Payer: PPO | Source: Ambulatory Visit | Attending: Cardiothoracic Surgery | Admitting: Cardiothoracic Surgery

## 2015-01-22 ENCOUNTER — Encounter (HOSPITAL_COMMUNITY): Payer: Self-pay

## 2015-01-22 VITALS — BP 132/66 | HR 71 | Temp 97.7°F | Resp 20 | Ht 68.0 in | Wt 210.5 lb

## 2015-01-22 DIAGNOSIS — Z0183 Encounter for blood typing: Secondary | ICD-10-CM | POA: Diagnosis not present

## 2015-01-22 DIAGNOSIS — I2510 Atherosclerotic heart disease of native coronary artery without angina pectoris: Secondary | ICD-10-CM

## 2015-01-22 DIAGNOSIS — I6523 Occlusion and stenosis of bilateral carotid arteries: Secondary | ICD-10-CM | POA: Diagnosis not present

## 2015-01-22 DIAGNOSIS — Z01818 Encounter for other preprocedural examination: Secondary | ICD-10-CM | POA: Insufficient documentation

## 2015-01-22 DIAGNOSIS — I10 Essential (primary) hypertension: Secondary | ICD-10-CM | POA: Diagnosis not present

## 2015-01-22 DIAGNOSIS — R079 Chest pain, unspecified: Secondary | ICD-10-CM | POA: Diagnosis not present

## 2015-01-22 DIAGNOSIS — Z01812 Encounter for preprocedural laboratory examination: Secondary | ICD-10-CM | POA: Insufficient documentation

## 2015-01-22 HISTORY — DX: Unspecified osteoarthritis, unspecified site: M19.90

## 2015-01-22 HISTORY — DX: Gastro-esophageal reflux disease without esophagitis: K21.9

## 2015-01-22 LAB — PULMONARY FUNCTION TEST
DL/VA % pred: 74 %
DL/VA: 3.34 ml/min/mmHg/L
DLCO unc % pred: 77 %
DLCO unc: 23.1 ml/min/mmHg
FEF 25-75 Post: 2.91 L/sec
FEF 25-75 Pre: 2.26 L/sec
FEF2575-%Change-Post: 29 %
FEF2575-%Pred-Post: 135 %
FEF2575-%Pred-Pre: 104 %
FEV1-%Change-Post: 6 %
FEV1-%Pred-Post: 115 %
FEV1-%Pred-Pre: 109 %
FEV1-Post: 3.36 L
FEV1-Pre: 3.16 L
FEV1FVC-%Change-Post: 4 %
FEV1FVC-%Pred-Pre: 101 %
FEV6-%Change-Post: 3 %
FEV6-%Pred-Post: 116 %
FEV6-%Pred-Pre: 113 %
FEV6-Post: 4.36 L
FEV6-Pre: 4.23 L
FEV6FVC-%Change-Post: 1 %
FEV6FVC-%Pred-Post: 106 %
FEV6FVC-%Pred-Pre: 105 %
FVC-%Change-Post: 2 %
FVC-%Pred-Post: 109 %
FVC-%Pred-Pre: 107 %
FVC-Post: 4.37 L
FVC-Pre: 4.28 L
Post FEV1/FVC ratio: 77 %
Post FEV6/FVC ratio: 100 %
Pre FEV1/FVC ratio: 74 %
Pre FEV6/FVC Ratio: 99 %
RV % pred: 142 %
RV: 3.43 L
TLC % pred: 125 %
TLC: 8.32 L

## 2015-01-22 LAB — BLOOD GAS, ARTERIAL
Acid-base deficit: 2.8 mmol/L — ABNORMAL HIGH (ref 0.0–2.0)
Bicarbonate: 21.2 mEq/L (ref 20.0–24.0)
Drawn by: 206361
FIO2: 0.21
O2 Saturation: 95.6 %
Patient temperature: 98.6
TCO2: 22.3 mmol/L (ref 0–100)
pCO2 arterial: 34.7 mmHg — ABNORMAL LOW (ref 35.0–45.0)
pH, Arterial: 7.403 (ref 7.350–7.450)
pO2, Arterial: 75.7 mmHg — ABNORMAL LOW (ref 80.0–100.0)

## 2015-01-22 LAB — SURGICAL PCR SCREEN
MRSA, PCR: NEGATIVE
Staphylococcus aureus: POSITIVE — AB

## 2015-01-22 LAB — COMPREHENSIVE METABOLIC PANEL
ALT: 19 U/L (ref 17–63)
AST: 18 U/L (ref 15–41)
Albumin: 4.1 g/dL (ref 3.5–5.0)
Alkaline Phosphatase: 60 U/L (ref 38–126)
Anion gap: 10 (ref 5–15)
BUN: 14 mg/dL (ref 6–20)
CO2: 18 mmol/L — ABNORMAL LOW (ref 22–32)
Calcium: 9.4 mg/dL (ref 8.9–10.3)
Chloride: 110 mmol/L (ref 101–111)
Creatinine, Ser: 0.91 mg/dL (ref 0.61–1.24)
GFR calc Af Amer: >60 mL/min (ref >60)
GFR calc non Af Amer: >60 mL/min (ref >60)
Glucose, Bld: 102 mg/dL — ABNORMAL HIGH (ref 65–99)
Potassium: 4.5 mmol/L (ref 3.5–5.1)
Sodium: 138 mmol/L (ref 135–145)
Total Bilirubin: 0.6 mg/dL (ref 0.3–1.2)
Total Protein: 6.7 g/dL (ref 6.5–8.1)

## 2015-01-22 LAB — CBC
HCT: 46.6 % (ref 39.0–52.0)
Hemoglobin: 16.2 g/dL (ref 13.0–17.0)
MCH: 30.9 pg (ref 26.0–34.0)
MCHC: 34.8 g/dL (ref 30.0–36.0)
MCV: 88.9 fL (ref 78.0–100.0)
Platelets: 208 10*3/uL (ref 150–400)
RBC: 5.24 MIL/uL (ref 4.22–5.81)
RDW: 12.7 % (ref 11.5–15.5)
WBC: 8 10*3/uL (ref 4.0–10.5)

## 2015-01-22 LAB — ABO/RH: ABO/RH(D): A NEG

## 2015-01-22 LAB — URINALYSIS, ROUTINE W REFLEX MICROSCOPIC
Bilirubin Urine: NEGATIVE
Glucose, UA: NEGATIVE mg/dL
Hgb urine dipstick: NEGATIVE
Ketones, ur: NEGATIVE mg/dL
Leukocytes, UA: NEGATIVE
Nitrite: NEGATIVE
Protein, ur: NEGATIVE mg/dL
Specific Gravity, Urine: 1.026 (ref 1.005–1.030)
Urobilinogen, UA: 0.2 mg/dL (ref 0.0–1.0)
pH: 5 (ref 5.0–8.0)

## 2015-01-22 LAB — APTT: aPTT: 33 seconds (ref 24–37)

## 2015-01-22 LAB — PROTIME-INR
INR: 1.14 (ref 0.00–1.49)
Prothrombin Time: 14.7 seconds (ref 11.6–15.2)

## 2015-01-22 MED ORDER — ALBUTEROL SULFATE (2.5 MG/3ML) 0.083% IN NEBU
2.5000 mg | INHALATION_SOLUTION | Freq: Once | RESPIRATORY_TRACT | Status: AC
  Administered 2015-01-22: 2.5 mg via RESPIRATORY_TRACT

## 2015-01-22 NOTE — Progress Notes (Signed)
Spoke with Jana Half at [facility], pt. Has Resp. Appt. At 1200n today, Vascular appt. Is on 01/23/2015 at 1300.  Pt. Informed of the same.

## 2015-01-23 ENCOUNTER — Encounter (HOSPITAL_COMMUNITY): Payer: Self-pay | Admitting: Certified Registered Nurse Anesthetist

## 2015-01-23 ENCOUNTER — Ambulatory Visit (HOSPITAL_COMMUNITY)
Admission: RE | Admit: 2015-01-23 | Discharge: 2015-01-23 | Disposition: A | Discharge status: Home / Self Care | Payer: PPO | Source: Ambulatory Visit | Attending: Cardiothoracic Surgery | Admitting: Cardiothoracic Surgery

## 2015-01-23 ENCOUNTER — Ambulatory Visit (HOSPITAL_BASED_OUTPATIENT_CLINIC_OR_DEPARTMENT_OTHER)
Admission: RE | Admit: 2015-01-23 | Discharge: 2015-01-23 | Disposition: A | Discharge status: Home / Self Care | Payer: PPO | Source: Ambulatory Visit | Attending: Cardiothoracic Surgery | Admitting: Cardiothoracic Surgery

## 2015-01-23 DIAGNOSIS — R911 Solitary pulmonary nodule: Secondary | ICD-10-CM | POA: Diagnosis not present

## 2015-01-23 DIAGNOSIS — I10 Essential (primary) hypertension: Secondary | ICD-10-CM | POA: Insufficient documentation

## 2015-01-23 DIAGNOSIS — I2510 Atherosclerotic heart disease of native coronary artery without angina pectoris: Secondary | ICD-10-CM

## 2015-01-23 DIAGNOSIS — R079 Chest pain, unspecified: Secondary | ICD-10-CM

## 2015-01-23 DIAGNOSIS — I252 Old myocardial infarction: Secondary | ICD-10-CM

## 2015-01-23 DIAGNOSIS — I6523 Occlusion and stenosis of bilateral carotid arteries: Secondary | ICD-10-CM | POA: Insufficient documentation

## 2015-01-23 DIAGNOSIS — Z01818 Encounter for other preprocedural examination: Secondary | ICD-10-CM

## 2015-01-23 LAB — HEMOGLOBIN A1C
Hgb A1c MFr Bld: 5.8 % — ABNORMAL HIGH (ref 4.8–5.6)
Mean Plasma Glucose: 120 mg/dL

## 2015-01-23 MED ORDER — DEXTROSE 5 % IV SOLN
750.0000 mg | INTRAVENOUS | Status: DC
  Filled 2015-01-23: qty 750

## 2015-01-23 MED ORDER — PLASMA-LYTE 148 IV SOLN
INTRAVENOUS | Status: AC
  Administered 2015-01-24: 500 mL
  Filled 2015-01-23: qty 2.5

## 2015-01-23 MED ORDER — CEFUROXIME SODIUM 1.5 G IJ SOLR
1.5000 g | INTRAMUSCULAR | Status: AC
  Administered 2015-01-24: 1.5 g via INTRAVENOUS
  Administered 2015-01-24: .75 g via INTRAVENOUS
  Filled 2015-01-23 (×2): qty 1.5

## 2015-01-23 MED ORDER — POTASSIUM CHLORIDE 2 MEQ/ML IV SOLN
80.0000 meq | INTRAVENOUS | Status: DC
  Filled 2015-01-23: qty 40

## 2015-01-23 MED ORDER — METOPROLOL TARTRATE 12.5 MG HALF TABLET
12.5000 mg | ORAL_TABLET | ORAL | Status: DC
Start: 2015-01-24 — End: 2015-01-24

## 2015-01-23 MED ORDER — EPINEPHRINE HCL 1 MG/ML IJ SOLN
0.0000 ug/min | INTRAVENOUS | Status: DC
  Filled 2015-01-23: qty 4

## 2015-01-23 MED ORDER — VANCOMYCIN HCL 10 G IV SOLR
1500.0000 mg | INTRAVENOUS | Status: AC
  Administered 2015-01-24: 1500 mg via INTRAVENOUS
  Filled 2015-01-23: qty 1500

## 2015-01-23 MED ORDER — MAGNESIUM SULFATE 50 % IJ SOLN
40.0000 meq | INTRAMUSCULAR | Status: DC
  Filled 2015-01-23: qty 10

## 2015-01-23 MED ORDER — DEXMEDETOMIDINE HCL IN NACL 400 MCG/100ML IV SOLN
0.1000 ug/kg/h | INTRAVENOUS | Status: AC
  Administered 2015-01-24: .3 ug/kg/h via INTRAVENOUS
  Filled 2015-01-23: qty 100

## 2015-01-23 MED ORDER — DOPAMINE-DEXTROSE 3.2-5 MG/ML-% IV SOLN
0.0000 ug/kg/min | INTRAVENOUS | Status: DC
  Filled 2015-01-23: qty 250

## 2015-01-23 MED ORDER — NITROGLYCERIN IN D5W 200-5 MCG/ML-% IV SOLN
2.0000 ug/min | INTRAVENOUS | Status: AC
  Administered 2015-01-24: 5 ug/min via INTRAVENOUS
  Filled 2015-01-23: qty 250

## 2015-01-23 MED ORDER — SODIUM CHLORIDE 0.9 % IV SOLN
INTRAVENOUS | Status: AC
  Administered 2015-01-24: 1 [IU]/h via INTRAVENOUS
  Filled 2015-01-23: qty 2.5

## 2015-01-23 MED ORDER — PHENYLEPHRINE HCL 10 MG/ML IJ SOLN
30.0000 ug/min | INTRAVENOUS | Status: AC
  Administered 2015-01-24: 10 ug/min via INTRAVENOUS
  Filled 2015-01-23: qty 2

## 2015-01-23 MED ORDER — SODIUM CHLORIDE 0.9 % IV SOLN
INTRAVENOUS | Status: DC
  Filled 2015-01-23: qty 30

## 2015-01-23 MED ORDER — SODIUM CHLORIDE 0.9 % IV SOLN
INTRAVENOUS | Status: AC
  Administered 2015-01-24: 69.8 mL/h via INTRAVENOUS
  Filled 2015-01-23: qty 40

## 2015-01-23 NOTE — Progress Notes (Signed)
Echocardiogram 2D Echocardiogram has been performed.  Tresa Res 01/23/2015, 1:54 PM

## 2015-01-23 NOTE — Progress Notes (Signed)
VASCULAR LAB PRELIMINARY  PRELIMINARY  PRELIMINARY  PRELIMINARY  Pre-op Cardiac Surgery  Carotid Findings:  Bilateral:  1-39% ICA stenosis.  Vertebral artery flow is antegrade.      Upper Extremity Right Left  Brachial Pressures 145  Triphasic  151  Triphasic   Radial Waveforms Triphasic  Triphasic   Ulnar Waveforms Triphasic  Triphasic   Palmar Arch (Allen's Test) Obliterates with radial compression, normal with ulnar. Within normal limits       Randall Ali, RVT 01/23/2015, 3:10 PM

## 2015-01-24 ENCOUNTER — Inpatient Hospital Stay (HOSPITAL_COMMUNITY): Payer: PPO

## 2015-01-24 ENCOUNTER — Encounter (HOSPITAL_COMMUNITY)
Admission: RE | Disposition: A | Discharge status: Home Health | Payer: PPO | Source: Ambulatory Visit | Attending: Cardiothoracic Surgery

## 2015-01-24 ENCOUNTER — Inpatient Hospital Stay (HOSPITAL_COMMUNITY)
Admission: RE | Admit: 2015-01-24 | Discharge: 2015-02-09 | DRG: 235 | Disposition: A | Discharge status: Home Health | Payer: PPO | Source: Ambulatory Visit | Attending: Cardiothoracic Surgery | Admitting: Cardiothoracic Surgery

## 2015-01-24 ENCOUNTER — Encounter (HOSPITAL_COMMUNITY): Payer: Self-pay | Admitting: *Deleted

## 2015-01-24 ENCOUNTER — Inpatient Hospital Stay (HOSPITAL_COMMUNITY): Payer: PPO | Admitting: Certified Registered Nurse Anesthetist

## 2015-01-24 DIAGNOSIS — Z87891 Personal history of nicotine dependence: Secondary | ICD-10-CM

## 2015-01-24 DIAGNOSIS — T814XXA Infection following a procedure, initial encounter: Secondary | ICD-10-CM | POA: Diagnosis not present

## 2015-01-24 DIAGNOSIS — T814XXD Infection following a procedure, subsequent encounter: Secondary | ICD-10-CM | POA: Diagnosis not present

## 2015-01-24 DIAGNOSIS — G9341 Metabolic encephalopathy: Secondary | ICD-10-CM | POA: Diagnosis not present

## 2015-01-24 DIAGNOSIS — Z9889 Other specified postprocedural states: Secondary | ICD-10-CM

## 2015-01-24 DIAGNOSIS — R911 Solitary pulmonary nodule: Secondary | ICD-10-CM | POA: Diagnosis present

## 2015-01-24 DIAGNOSIS — Z9289 Personal history of other medical treatment: Secondary | ICD-10-CM

## 2015-01-24 DIAGNOSIS — I252 Old myocardial infarction: Secondary | ICD-10-CM | POA: Diagnosis not present

## 2015-01-24 DIAGNOSIS — E873 Alkalosis: Secondary | ICD-10-CM | POA: Diagnosis not present

## 2015-01-24 DIAGNOSIS — B9689 Other specified bacterial agents as the cause of diseases classified elsewhere: Secondary | ICD-10-CM | POA: Diagnosis not present

## 2015-01-24 DIAGNOSIS — N529 Male erectile dysfunction, unspecified: Secondary | ICD-10-CM | POA: Diagnosis present

## 2015-01-24 DIAGNOSIS — J9620 Acute and chronic respiratory failure, unspecified whether with hypoxia or hypercapnia: Secondary | ICD-10-CM

## 2015-01-24 DIAGNOSIS — I25119 Atherosclerotic heart disease of native coronary artery with unspecified angina pectoris: Secondary | ICD-10-CM | POA: Diagnosis present

## 2015-01-24 DIAGNOSIS — Y839 Surgical procedure, unspecified as the cause of abnormal reaction of the patient, or of later complication, without mention of misadventure at the time of the procedure: Secondary | ICD-10-CM | POA: Diagnosis not present

## 2015-01-24 DIAGNOSIS — G4700 Insomnia, unspecified: Secondary | ICD-10-CM | POA: Diagnosis present

## 2015-01-24 DIAGNOSIS — T8149XA Infection following a procedure, other surgical site, initial encounter: Secondary | ICD-10-CM | POA: Diagnosis present

## 2015-01-24 DIAGNOSIS — D62 Acute posthemorrhagic anemia: Secondary | ICD-10-CM | POA: Diagnosis not present

## 2015-01-24 DIAGNOSIS — A4150 Gram-negative sepsis, unspecified: Secondary | ICD-10-CM | POA: Diagnosis not present

## 2015-01-24 DIAGNOSIS — B952 Enterococcus as the cause of diseases classified elsewhere: Secondary | ICD-10-CM | POA: Diagnosis not present

## 2015-01-24 DIAGNOSIS — R339 Retention of urine, unspecified: Secondary | ICD-10-CM | POA: Diagnosis not present

## 2015-01-24 DIAGNOSIS — I9789 Other postprocedural complications and disorders of the circulatory system, not elsewhere classified: Secondary | ICD-10-CM | POA: Diagnosis present

## 2015-01-24 DIAGNOSIS — K219 Gastro-esophageal reflux disease without esophagitis: Secondary | ICD-10-CM | POA: Diagnosis present

## 2015-01-24 DIAGNOSIS — Z978 Presence of other specified devices: Secondary | ICD-10-CM

## 2015-01-24 DIAGNOSIS — Z951 Presence of aortocoronary bypass graft: Secondary | ICD-10-CM | POA: Diagnosis not present

## 2015-01-24 DIAGNOSIS — A419 Sepsis, unspecified organism: Secondary | ICD-10-CM | POA: Diagnosis not present

## 2015-01-24 DIAGNOSIS — K210 Gastro-esophageal reflux disease with esophagitis: Secondary | ICD-10-CM | POA: Diagnosis present

## 2015-01-24 DIAGNOSIS — Z955 Presence of coronary angioplasty implant and graft: Secondary | ICD-10-CM | POA: Diagnosis not present

## 2015-01-24 DIAGNOSIS — J9600 Acute respiratory failure, unspecified whether with hypoxia or hypercapnia: Secondary | ICD-10-CM

## 2015-01-24 DIAGNOSIS — F05 Delirium due to known physiological condition: Secondary | ICD-10-CM | POA: Diagnosis not present

## 2015-01-24 DIAGNOSIS — F10231 Alcohol dependence with withdrawal delirium: Secondary | ICD-10-CM | POA: Diagnosis not present

## 2015-01-24 DIAGNOSIS — E8770 Fluid overload, unspecified: Secondary | ICD-10-CM | POA: Diagnosis not present

## 2015-01-24 DIAGNOSIS — I2510 Atherosclerotic heart disease of native coronary artery without angina pectoris: Secondary | ICD-10-CM

## 2015-01-24 DIAGNOSIS — I10 Essential (primary) hypertension: Secondary | ICD-10-CM | POA: Diagnosis present

## 2015-01-24 DIAGNOSIS — I4892 Unspecified atrial flutter: Secondary | ICD-10-CM | POA: Diagnosis not present

## 2015-01-24 DIAGNOSIS — R0602 Shortness of breath: Secondary | ICD-10-CM

## 2015-01-24 DIAGNOSIS — I4891 Unspecified atrial fibrillation: Secondary | ICD-10-CM | POA: Diagnosis not present

## 2015-01-24 DIAGNOSIS — M1990 Unspecified osteoarthritis, unspecified site: Secondary | ICD-10-CM | POA: Diagnosis present

## 2015-01-24 DIAGNOSIS — A498 Other bacterial infections of unspecified site: Secondary | ICD-10-CM | POA: Diagnosis present

## 2015-01-24 DIAGNOSIS — Y838 Other surgical procedures as the cause of abnormal reaction of the patient, or of later complication, without mention of misadventure at the time of the procedure: Secondary | ICD-10-CM | POA: Diagnosis not present

## 2015-01-24 DIAGNOSIS — E876 Hypokalemia: Secondary | ICD-10-CM | POA: Diagnosis not present

## 2015-01-24 DIAGNOSIS — N179 Acute kidney failure, unspecified: Secondary | ICD-10-CM | POA: Diagnosis not present

## 2015-01-24 DIAGNOSIS — J9601 Acute respiratory failure with hypoxia: Secondary | ICD-10-CM | POA: Diagnosis not present

## 2015-01-24 DIAGNOSIS — T8140XA Infection following a procedure, unspecified, initial encounter: Secondary | ICD-10-CM

## 2015-01-24 DIAGNOSIS — G4733 Obstructive sleep apnea (adult) (pediatric): Secondary | ICD-10-CM | POA: Diagnosis present

## 2015-01-24 DIAGNOSIS — E785 Hyperlipidemia, unspecified: Secondary | ICD-10-CM | POA: Diagnosis present

## 2015-01-24 DIAGNOSIS — Z8249 Family history of ischemic heart disease and other diseases of the circulatory system: Secondary | ICD-10-CM | POA: Diagnosis not present

## 2015-01-24 DIAGNOSIS — I25110 Atherosclerotic heart disease of native coronary artery with unstable angina pectoris: Secondary | ICD-10-CM | POA: Diagnosis not present

## 2015-01-24 HISTORY — PX: CORONARY ARTERY BYPASS GRAFT: SHX141

## 2015-01-24 HISTORY — PX: TEE WITHOUT CARDIOVERSION: SHX5443

## 2015-01-24 LAB — POCT I-STAT 3, ART BLOOD GAS (G3+)
Acid-base deficit: 2 mmol/L (ref 0.0–2.0)
Acid-base deficit: 3 mmol/L — ABNORMAL HIGH (ref 0.0–2.0)
Acid-base deficit: 4 mmol/L — ABNORMAL HIGH (ref 0.0–2.0)
Bicarbonate: 26.3 mEq/L — ABNORMAL HIGH (ref 20.0–24.0)
Bicarbonate: 24 mEq/L (ref 20.0–24.0)
Bicarbonate: 22.1 mEq/L (ref 20.0–24.0)
Bicarbonate: 20.4 mEq/L (ref 20.0–24.0)
O2 Saturation: 100 %
O2 Saturation: 96 %
O2 Saturation: 96 %
O2 Saturation: 91 %
Patient temperature: 35.4
Patient temperature: 36.8
Patient temperature: 36.9
TCO2: 28 mmol/L (ref 0–100)
TCO2: 25 mmol/L (ref 0–100)
TCO2: 23 mmol/L (ref 0–100)
TCO2: 21 mmol/L (ref 0–100)
pCO2 arterial: 48 mmHg — ABNORMAL HIGH (ref 35.0–45.0)
pCO2 arterial: 42.4 mmHg (ref 35.0–45.0)
pCO2 arterial: 38.8 mmHg (ref 35.0–45.0)
pCO2 arterial: 35.3 mmHg (ref 35.0–45.0)
pH, Arterial: 7.347 — ABNORMAL LOW (ref 7.350–7.450)
pH, Arterial: 7.354 (ref 7.350–7.450)
pH, Arterial: 7.362 (ref 7.350–7.450)
pH, Arterial: 7.369 (ref 7.350–7.450)
pO2, Arterial: 243 mmHg — ABNORMAL HIGH (ref 80.0–100.0)
pO2, Arterial: 79 mmHg — ABNORMAL LOW (ref 80.0–100.0)
pO2, Arterial: 81 mmHg (ref 80.0–100.0)
pO2, Arterial: 62 mmHg — ABNORMAL LOW (ref 80.0–100.0)

## 2015-01-24 LAB — POCT I-STAT, CHEM 8
BUN: 14 mg/dL (ref 6–20)
BUN: 12 mg/dL (ref 6–20)
BUN: 14 mg/dL (ref 6–20)
BUN: 13 mg/dL (ref 6–20)
BUN: 12 mg/dL (ref 6–20)
BUN: 15 mg/dL (ref 6–20)
Calcium, Ion: 1.23 mmol/L (ref 1.13–1.30)
Calcium, Ion: 1.12 mmol/L — ABNORMAL LOW (ref 1.13–1.30)
Calcium, Ion: 1.25 mmol/L (ref 1.13–1.30)
Calcium, Ion: 1.15 mmol/L (ref 1.13–1.30)
Calcium, Ion: 1.17 mmol/L (ref 1.13–1.30)
Calcium, Ion: 1.22 mmol/L (ref 1.13–1.30)
Chloride: 105 mmol/L (ref 101–111)
Chloride: 104 mmol/L (ref 101–111)
Chloride: 98 mmol/L — ABNORMAL LOW (ref 101–111)
Chloride: 102 mmol/L (ref 101–111)
Chloride: 101 mmol/L (ref 101–111)
Chloride: 105 mmol/L (ref 101–111)
Creatinine, Ser: 0.9 mg/dL (ref 0.61–1.24)
Creatinine, Ser: 0.7 mg/dL (ref 0.61–1.24)
Creatinine, Ser: 0.8 mg/dL (ref 0.61–1.24)
Creatinine, Ser: 0.8 mg/dL (ref 0.61–1.24)
Creatinine, Ser: 0.8 mg/dL (ref 0.61–1.24)
Creatinine, Ser: 0.9 mg/dL (ref 0.61–1.24)
Glucose, Bld: 109 mg/dL — ABNORMAL HIGH (ref 65–99)
Glucose, Bld: 100 mg/dL — ABNORMAL HIGH (ref 65–99)
Glucose, Bld: 99 mg/dL (ref 65–99)
Glucose, Bld: 144 mg/dL — ABNORMAL HIGH (ref 65–99)
Glucose, Bld: 150 mg/dL — ABNORMAL HIGH (ref 65–99)
Glucose, Bld: 129 mg/dL — ABNORMAL HIGH (ref 65–99)
HCT: 40 % (ref 39.0–52.0)
HCT: 31 % — ABNORMAL LOW (ref 39.0–52.0)
HCT: 39 % (ref 39.0–52.0)
HCT: 34 % — ABNORMAL LOW (ref 39.0–52.0)
HCT: 33 % — ABNORMAL LOW (ref 39.0–52.0)
HCT: 42 % (ref 39.0–52.0)
Hemoglobin: 13.6 g/dL (ref 13.0–17.0)
Hemoglobin: 10.5 g/dL — ABNORMAL LOW (ref 13.0–17.0)
Hemoglobin: 13.3 g/dL (ref 13.0–17.0)
Hemoglobin: 11.6 g/dL — ABNORMAL LOW (ref 13.0–17.0)
Hemoglobin: 11.2 g/dL — ABNORMAL LOW (ref 13.0–17.0)
Hemoglobin: 14.3 g/dL (ref 13.0–17.0)
Potassium: 4.6 mmol/L (ref 3.5–5.1)
Potassium: 4.3 mmol/L (ref 3.5–5.1)
Potassium: 4.5 mmol/L (ref 3.5–5.1)
Potassium: 4.7 mmol/L (ref 3.5–5.1)
Potassium: 3.9 mmol/L (ref 3.5–5.1)
Potassium: 4.8 mmol/L (ref 3.5–5.1)
Sodium: 138 mmol/L (ref 135–145)
Sodium: 136 mmol/L (ref 135–145)
Sodium: 138 mmol/L (ref 135–145)
Sodium: 136 mmol/L (ref 135–145)
Sodium: 138 mmol/L (ref 135–145)
Sodium: 136 mmol/L (ref 135–145)
TCO2: 25 mmol/L (ref 0–100)
TCO2: 25 mmol/L (ref 0–100)
TCO2: 25 mmol/L (ref 0–100)
TCO2: 23 mmol/L (ref 0–100)
TCO2: 21 mmol/L (ref 0–100)
TCO2: 23 mmol/L (ref 0–100)

## 2015-01-24 LAB — PROTIME-INR
INR: 1.34 (ref 0.00–1.49)
Prothrombin Time: 16.7 seconds — ABNORMAL HIGH (ref 11.6–15.2)

## 2015-01-24 LAB — CREATININE, SERUM
Creatinine, Ser: 0.94 mg/dL (ref 0.61–1.24)
GFR calc Af Amer: >60 mL/min (ref >60)
GFR calc non Af Amer: >60 mL/min (ref >60)

## 2015-01-24 LAB — CBC
HCT: 36.1 % — ABNORMAL LOW (ref 39.0–52.0)
HCT: 40.8 % (ref 39.0–52.0)
Hemoglobin: 12.3 g/dL — ABNORMAL LOW (ref 13.0–17.0)
Hemoglobin: 14 g/dL (ref 13.0–17.0)
MCH: 30.1 pg (ref 26.0–34.0)
MCH: 30.7 pg (ref 26.0–34.0)
MCHC: 34.1 g/dL (ref 30.0–36.0)
MCHC: 34.3 g/dL (ref 30.0–36.0)
MCV: 88.5 fL (ref 78.0–100.0)
MCV: 89.5 fL (ref 78.0–100.0)
Platelets: 122 10*3/uL — ABNORMAL LOW (ref 150–400)
Platelets: 167 10*3/uL (ref 150–400)
RBC: 4.08 MIL/uL — ABNORMAL LOW (ref 4.22–5.81)
RBC: 4.56 MIL/uL (ref 4.22–5.81)
RDW: 12.6 % (ref 11.5–15.5)
RDW: 12.8 % (ref 11.5–15.5)
WBC: 11.3 10*3/uL — ABNORMAL HIGH (ref 4.0–10.5)
WBC: 17 10*3/uL — ABNORMAL HIGH (ref 4.0–10.5)

## 2015-01-24 LAB — APTT: aPTT: 36 seconds (ref 24–37)

## 2015-01-24 LAB — MAGNESIUM: Magnesium: 2.6 mg/dL — ABNORMAL HIGH (ref 1.7–2.4)

## 2015-01-24 LAB — POCT I-STAT 4, (NA,K, GLUC, HGB,HCT)
Glucose, Bld: 101 mg/dL — ABNORMAL HIGH (ref 65–99)
HCT: 36 % — ABNORMAL LOW (ref 39.0–52.0)
Hemoglobin: 12.2 g/dL — ABNORMAL LOW (ref 13.0–17.0)
Potassium: 3.5 mmol/L (ref 3.5–5.1)
Sodium: 140 mmol/L (ref 135–145)

## 2015-01-24 LAB — GLUCOSE, CAPILLARY
Glucose-Capillary: 94 mg/dL (ref 65–99)
Glucose-Capillary: 108 mg/dL — ABNORMAL HIGH (ref 65–99)
Glucose-Capillary: 114 mg/dL — ABNORMAL HIGH (ref 65–99)
Glucose-Capillary: 118 mg/dL — ABNORMAL HIGH (ref 65–99)
Glucose-Capillary: 131 mg/dL — ABNORMAL HIGH (ref 65–99)

## 2015-01-24 LAB — HEMOGLOBIN AND HEMATOCRIT, BLOOD
HCT: 33.4 % — ABNORMAL LOW (ref 39.0–52.0)
Hemoglobin: 11.6 g/dL — ABNORMAL LOW (ref 13.0–17.0)

## 2015-01-24 LAB — PLATELET COUNT: Platelets: 164 10*3/uL (ref 150–400)

## 2015-01-24 SURGERY — CORONARY ARTERY BYPASS GRAFTING (CABG)
Anesthesia: General | Site: Chest

## 2015-01-24 MED ORDER — FENTANYL CITRATE (PF) 250 MCG/5ML IJ SOLN
INTRAMUSCULAR | Status: AC
  Filled 2015-01-24: qty 5

## 2015-01-24 MED ORDER — LACTATED RINGERS IV SOLN: 500.0000 mL | Freq: Once | INTRAVENOUS | Status: DC | PRN

## 2015-01-24 MED ORDER — SODIUM CHLORIDE 0.45 % IV SOLN
INTRAVENOUS | Status: DC | PRN
  Administered 2015-01-24: 20:00:00 via INTRAVENOUS

## 2015-01-24 MED ORDER — BISACODYL 5 MG PO TBEC
10.0000 mg | DELAYED_RELEASE_TABLET | Freq: Every day | ORAL | Status: DC
  Administered 2015-01-25 – 2015-01-26 (×2): 10 mg via ORAL
  Filled 2015-01-24 (×2): qty 2

## 2015-01-24 MED ORDER — SIMVASTATIN 20 MG PO TABS
20.0000 mg | ORAL_TABLET | Freq: Every day | ORAL | Status: DC
  Administered 2015-01-24 – 2015-01-26 (×3): 20 mg via ORAL
  Filled 2015-01-24 (×4): qty 1

## 2015-01-24 MED ORDER — DEXTROSE 5 % IV SOLN
1.5000 g | Freq: Two times a day (BID) | INTRAVENOUS | Status: AC
  Administered 2015-01-24 – 2015-01-26 (×4): 1.5 g via INTRAVENOUS
  Filled 2015-01-24 (×5): qty 1.5

## 2015-01-24 MED ORDER — PHENYLEPHRINE 40 MCG/ML (10ML) SYRINGE FOR IV PUSH (FOR BLOOD PRESSURE SUPPORT)
PREFILLED_SYRINGE | INTRAVENOUS | Status: AC
  Filled 2015-01-24: qty 10

## 2015-01-24 MED ORDER — PROPOFOL 10 MG/ML IV BOLUS
INTRAVENOUS | Status: AC
  Filled 2015-01-24: qty 20

## 2015-01-24 MED ORDER — CALCIUM CHLORIDE 10 % IV SOLN
INTRAVENOUS | Status: DC | PRN
  Administered 2015-01-24: 200 mg via INTRAVENOUS

## 2015-01-24 MED ORDER — CALCIUM CHLORIDE 10 % IV SOLN
INTRAVENOUS | Status: AC
  Filled 2015-01-24: qty 10

## 2015-01-24 MED ORDER — PHENYLEPHRINE HCL 10 MG/ML IJ SOLN
INTRAMUSCULAR | Status: DC | PRN
  Administered 2015-01-24: 40 ug via INTRAVENOUS
  Administered 2015-01-24: 80 ug via INTRAVENOUS
  Administered 2015-01-24: 40 ug via INTRAVENOUS

## 2015-01-24 MED ORDER — PROPOFOL 10 MG/ML IV BOLUS
INTRAVENOUS | Status: DC | PRN
  Administered 2015-01-24: 70 mg via INTRAVENOUS

## 2015-01-24 MED ORDER — ARTIFICIAL TEARS OP OINT
TOPICAL_OINTMENT | OPHTHALMIC | Status: AC
  Filled 2015-01-24: qty 3.5

## 2015-01-24 MED ORDER — LACTATED RINGERS IV SOLN
INTRAVENOUS | Status: DC
  Administered 2015-01-24: 20 mL/h via INTRAVENOUS

## 2015-01-24 MED ORDER — POTASSIUM CHLORIDE 10 MEQ/50ML IV SOLN
10.0000 meq | INTRAVENOUS | Status: AC
  Administered 2015-01-24 (×3): 10 meq via INTRAVENOUS

## 2015-01-24 MED ORDER — SUCCINYLCHOLINE CHLORIDE 20 MG/ML IJ SOLN
INTRAMUSCULAR | Status: DC | PRN
  Administered 2015-01-24: 100 mg via INTRAVENOUS

## 2015-01-24 MED ORDER — CETYLPYRIDINIUM CHLORIDE 0.05 % MT LIQD
7.0000 mL | Freq: Two times a day (BID) | OROMUCOSAL | Status: DC
  Administered 2015-01-24 – 2015-01-27 (×6): 7 mL via OROMUCOSAL

## 2015-01-24 MED ORDER — SODIUM CHLORIDE 0.9 % IV SOLN
INTRAVENOUS | Status: DC
  Administered 2015-01-24: 21:00:00 via INTRAVENOUS
  Filled 2015-01-24 (×2): qty 2.5

## 2015-01-24 MED ORDER — ROCURONIUM BROMIDE 50 MG/5ML IV SOLN
INTRAVENOUS | Status: AC
  Filled 2015-01-24: qty 2

## 2015-01-24 MED ORDER — DOCUSATE SODIUM 100 MG PO CAPS
200.0000 mg | ORAL_CAPSULE | Freq: Every day | ORAL | Status: DC
  Administered 2015-01-25 – 2015-01-26 (×2): 200 mg via ORAL
  Filled 2015-01-24 (×2): qty 2

## 2015-01-24 MED ORDER — HEPARIN SODIUM (PORCINE) 1000 UNIT/ML IJ SOLN
INTRAMUSCULAR | Status: DC | PRN
  Administered 2015-01-24: 22000 [IU] via INTRAVENOUS

## 2015-01-24 MED ORDER — METOPROLOL TARTRATE 1 MG/ML IV SOLN
2.5000 mg | INTRAVENOUS | Status: DC | PRN
  Administered 2015-01-26 (×2): 5 mg via INTRAVENOUS
  Administered 2015-01-28 – 2015-01-29 (×3): 2.5 mg via INTRAVENOUS
  Filled 2015-01-24 (×4): qty 5

## 2015-01-24 MED ORDER — MIDAZOLAM HCL 2 MG/2ML IJ SOLN: 2.0000 mg | INTRAMUSCULAR | Status: DC | PRN

## 2015-01-24 MED ORDER — SODIUM CHLORIDE 0.9 % IJ SOLN
3.0000 mL | Freq: Two times a day (BID) | INTRAMUSCULAR | Status: DC
  Administered 2015-01-25 (×2): 3 mL via INTRAVENOUS

## 2015-01-24 MED ORDER — ACETAMINOPHEN 650 MG RE SUPP
650.0000 mg | Freq: Once | RECTAL | Status: AC
Start: 2015-01-24 — End: 2015-01-24
  Administered 2015-01-24: 650 mg via RECTAL

## 2015-01-24 MED ORDER — MORPHINE SULFATE (PF) 2 MG/ML IV SOLN: 1.0000 mg | INTRAVENOUS | Status: AC | PRN

## 2015-01-24 MED ORDER — STERILE WATER FOR INJECTION IJ SOLN
INTRAMUSCULAR | Status: AC
  Filled 2015-01-24: qty 20

## 2015-01-24 MED ORDER — LACTATED RINGERS IV SOLN
INTRAVENOUS | Status: DC | PRN
  Administered 2015-01-24: 08:00:00 via INTRAVENOUS

## 2015-01-24 MED ORDER — ANTISEPTIC ORAL RINSE SOLUTION (CORINZ): 7.0000 mL | Freq: Four times a day (QID) | OROMUCOSAL | Status: DC

## 2015-01-24 MED ORDER — PHENYLEPHRINE HCL 10 MG/ML IJ SOLN
0.0000 ug/min | INTRAVENOUS | Status: DC
  Filled 2015-01-24: qty 2

## 2015-01-24 MED ORDER — MUPIROCIN 2 % EX OINT
TOPICAL_OINTMENT | Freq: Two times a day (BID) | CUTANEOUS | Status: DC
  Administered 2015-01-24 – 2015-01-25 (×3): via NASAL
  Administered 2015-01-26: 1 via NASAL
  Administered 2015-01-26 – 2015-01-30 (×9): via NASAL
  Administered 2015-01-31: 1 via NASAL
  Administered 2015-01-31 – 2015-02-02 (×4): via NASAL
  Administered 2015-02-02: 1 via NASAL
  Administered 2015-02-03: 21:00:00 via NASAL
  Administered 2015-02-03: 1 via NASAL
  Administered 2015-02-04: 22:00:00 via NASAL
  Administered 2015-02-04: 1 via NASAL
  Administered 2015-02-05 (×2): via NASAL
  Filled 2015-01-24 (×2): qty 22

## 2015-01-24 MED ORDER — CHLORHEXIDINE GLUCONATE CLOTH 2 % EX PADS
6.0000 | MEDICATED_PAD | Freq: Every day | CUTANEOUS | Status: AC
  Administered 2015-01-25 – 2015-01-29 (×5): 6 via TOPICAL

## 2015-01-24 MED ORDER — VANCOMYCIN HCL IN DEXTROSE 1-5 GM/200ML-% IV SOLN
1000.0000 mg | Freq: Once | INTRAVENOUS | Status: AC
  Administered 2015-01-24: 1000 mg via INTRAVENOUS
  Filled 2015-01-24: qty 200

## 2015-01-24 MED ORDER — 0.9 % SODIUM CHLORIDE (POUR BTL) OPTIME
TOPICAL | Status: DC | PRN
  Administered 2015-01-24: 6000 mL

## 2015-01-24 MED ORDER — PROTAMINE SULFATE 10 MG/ML IV SOLN
INTRAVENOUS | Status: AC
  Filled 2015-01-24: qty 25

## 2015-01-24 MED ORDER — SUCCINYLCHOLINE CHLORIDE 20 MG/ML IJ SOLN
INTRAMUSCULAR | Status: AC
  Filled 2015-01-24: qty 1

## 2015-01-24 MED ORDER — BISACODYL 10 MG RE SUPP
10.0000 mg | Freq: Every day | RECTAL | Status: DC
  Administered 2015-01-30 – 2015-02-05 (×3): 10 mg via RECTAL
  Filled 2015-01-24 (×4): qty 1

## 2015-01-24 MED ORDER — MORPHINE SULFATE (PF) 2 MG/ML IV SOLN
2.0000 mg | INTRAVENOUS | Status: DC | PRN
  Administered 2015-01-24 (×3): 2 mg via INTRAVENOUS
  Administered 2015-01-25: 4 mg via INTRAVENOUS
  Filled 2015-01-24: qty 1
  Filled 2015-01-24: qty 2
  Filled 2015-01-24 (×2): qty 1

## 2015-01-24 MED ORDER — INSULIN REGULAR BOLUS VIA INFUSION
0.0000 [IU] | Freq: Three times a day (TID) | INTRAVENOUS | Status: DC
  Filled 2015-01-24: qty 10

## 2015-01-24 MED ORDER — OXYCODONE HCL 5 MG PO TABS
5.0000 mg | ORAL_TABLET | ORAL | Status: DC | PRN
  Administered 2015-01-24 (×2): 5 mg via ORAL
  Administered 2015-01-25 – 2015-01-26 (×5): 10 mg via ORAL
  Filled 2015-01-24: qty 1
  Filled 2015-01-24 (×4): qty 2
  Filled 2015-01-24 (×2): qty 1
  Filled 2015-01-24: qty 2

## 2015-01-24 MED ORDER — ONDANSETRON HCL 4 MG/2ML IJ SOLN
4.0000 mg | Freq: Four times a day (QID) | INTRAMUSCULAR | Status: DC | PRN
  Administered 2015-01-25 – 2015-02-06 (×3): 4 mg via INTRAVENOUS
  Filled 2015-01-24 (×4): qty 2

## 2015-01-24 MED ORDER — FENTANYL CITRATE (PF) 100 MCG/2ML IJ SOLN
INTRAMUSCULAR | Status: DC | PRN
  Administered 2015-01-24: 100 ug via INTRAVENOUS
  Administered 2015-01-24 (×2): 150 ug via INTRAVENOUS
  Administered 2015-01-24: 250 ug via INTRAVENOUS
  Administered 2015-01-24: 100 ug via INTRAVENOUS
  Administered 2015-01-24: 200 ug via INTRAVENOUS
  Administered 2015-01-24: 100 ug via INTRAVENOUS
  Administered 2015-01-24: 150 ug via INTRAVENOUS
  Administered 2015-01-24: 50 ug via INTRAVENOUS
  Administered 2015-01-24: 500 ug via INTRAVENOUS
  Administered 2015-01-24: 250 ug via INTRAVENOUS

## 2015-01-24 MED ORDER — ACETAMINOPHEN 160 MG/5ML PO SOLN: 1000.0000 mg | Freq: Four times a day (QID) | ORAL | Status: DC

## 2015-01-24 MED ORDER — SODIUM CHLORIDE 0.9 % IV SOLN: 250.0000 mL | INTRAVENOUS | Status: DC

## 2015-01-24 MED ORDER — PANTOPRAZOLE SODIUM 40 MG PO TBEC
40.0000 mg | DELAYED_RELEASE_TABLET | Freq: Every day | ORAL | Status: DC
  Administered 2015-01-25 – 2015-01-26 (×2): 40 mg via ORAL
  Filled 2015-01-24 (×2): qty 1

## 2015-01-24 MED ORDER — HEMOSTATIC AGENTS (NO CHARGE) OPTIME
TOPICAL | Status: DC | PRN
  Administered 2015-01-24 (×5): 1 via TOPICAL

## 2015-01-24 MED ORDER — MIDAZOLAM HCL 10 MG/2ML IJ SOLN
INTRAMUSCULAR | Status: AC
Start: 2015-01-24 — End: 2015-01-24
  Filled 2015-01-24: qty 4

## 2015-01-24 MED ORDER — VECURONIUM BROMIDE 10 MG IV SOLR
INTRAVENOUS | Status: AC
  Filled 2015-01-24: qty 20

## 2015-01-24 MED ORDER — CHLORHEXIDINE GLUCONATE 0.12% ORAL RINSE (MEDLINE KIT): 15.0000 mL | Freq: Two times a day (BID) | OROMUCOSAL | Status: DC

## 2015-01-24 MED ORDER — ALBUMIN HUMAN 5 % IV SOLN: 250.0000 mL | INTRAVENOUS | Status: AC | PRN

## 2015-01-24 MED ORDER — LACTATED RINGERS IV SOLN: INTRAVENOUS | Status: DC

## 2015-01-24 MED ORDER — METOPROLOL TARTRATE 12.5 MG HALF TABLET
12.5000 mg | ORAL_TABLET | Freq: Two times a day (BID) | ORAL | Status: DC
  Administered 2015-01-25 (×2): 12.5 mg via ORAL
  Filled 2015-01-24 (×3): qty 1

## 2015-01-24 MED ORDER — ACETAMINOPHEN 500 MG PO TABS
1000.0000 mg | ORAL_TABLET | Freq: Four times a day (QID) | ORAL | Status: DC
  Administered 2015-01-25 – 2015-01-27 (×9): 1000 mg via ORAL
  Filled 2015-01-24 (×15): qty 2

## 2015-01-24 MED ORDER — LACTATED RINGERS IV SOLN
INTRAVENOUS | Status: DC | PRN
  Administered 2015-01-24 (×2): via INTRAVENOUS

## 2015-01-24 MED ORDER — MAGNESIUM SULFATE 4 GM/100ML IV SOLN
4.0000 g | Freq: Once | INTRAVENOUS | Status: AC
  Administered 2015-01-24: 4 g via INTRAVENOUS
  Filled 2015-01-24: qty 100

## 2015-01-24 MED ORDER — ROCURONIUM BROMIDE 100 MG/10ML IV SOLN
INTRAVENOUS | Status: DC | PRN
  Administered 2015-01-24 (×3): 50 mg via INTRAVENOUS

## 2015-01-24 MED ORDER — PROTAMINE SULFATE 10 MG/ML IV SOLN
INTRAVENOUS | Status: DC | PRN
Start: 2015-01-24 — End: 2015-01-24
  Administered 2015-01-24: 10 mg via INTRAVENOUS
  Administered 2015-01-24: 180 mg via INTRAVENOUS

## 2015-01-24 MED ORDER — MIDAZOLAM HCL 5 MG/5ML IJ SOLN
INTRAMUSCULAR | Status: DC | PRN
  Administered 2015-01-24 (×2): 1 mg via INTRAVENOUS
  Administered 2015-01-24: 5 mg via INTRAVENOUS
  Administered 2015-01-24: 3 mg via INTRAVENOUS

## 2015-01-24 MED ORDER — ALBUMIN HUMAN 5 % IV SOLN
INTRAVENOUS | Status: DC | PRN
  Administered 2015-01-24: 13:00:00 via INTRAVENOUS

## 2015-01-24 MED ORDER — ASPIRIN 81 MG PO CHEW: 324.0000 mg | CHEWABLE_TABLET | Freq: Every day | ORAL | Status: DC

## 2015-01-24 MED ORDER — FAMOTIDINE IN NACL 20-0.9 MG/50ML-% IV SOLN
20.0000 mg | Freq: Two times a day (BID) | INTRAVENOUS | Status: AC
  Administered 2015-01-24: 20 mg via INTRAVENOUS

## 2015-01-24 MED ORDER — SODIUM CHLORIDE 0.9 % IJ SOLN
3.0000 mL | INTRAMUSCULAR | Status: DC | PRN
  Administered 2015-01-25: via INTRAVENOUS
  Filled 2015-01-24: qty 3

## 2015-01-24 MED ORDER — NITROGLYCERIN IN D5W 200-5 MCG/ML-% IV SOLN: 0.0000 ug/min | INTRAVENOUS | Status: DC

## 2015-01-24 MED ORDER — CHLORHEXIDINE GLUCONATE 4 % EX LIQD: 30.0000 mL | CUTANEOUS | Status: DC

## 2015-01-24 MED ORDER — ANTISEPTIC ORAL RINSE SOLUTION (CORINZ)
7.0000 mL | OROMUCOSAL | Status: DC
  Administered 2015-01-24: 7 mL via OROMUCOSAL

## 2015-01-24 MED ORDER — DEXMEDETOMIDINE HCL IN NACL 200 MCG/50ML IV SOLN
0.0000 ug/kg/h | INTRAVENOUS | Status: DC
  Administered 2015-01-24: 0.7 ug/kg/h via INTRAVENOUS
  Filled 2015-01-24: qty 50

## 2015-01-24 MED ORDER — TRAMADOL HCL 50 MG PO TABS
50.0000 mg | ORAL_TABLET | ORAL | Status: DC | PRN
  Administered 2015-01-24: 100 mg via ORAL
  Administered 2015-01-25: 50 mg via ORAL
  Administered 2015-01-25 – 2015-02-02 (×2): 100 mg via ORAL
  Administered 2015-02-03 – 2015-02-08 (×7): 50 mg via ORAL
  Administered 2015-02-09: 100 mg via ORAL
  Filled 2015-01-24 (×2): qty 1
  Filled 2015-01-24 (×3): qty 2
  Filled 2015-01-24: qty 1
  Filled 2015-01-24 (×2): qty 2
  Filled 2015-01-24 (×6): qty 1

## 2015-01-24 MED ORDER — ASPIRIN EC 325 MG PO TBEC
325.0000 mg | DELAYED_RELEASE_TABLET | Freq: Every day | ORAL | Status: DC
  Administered 2015-01-25 – 2015-01-26 (×2): 325 mg via ORAL
  Filled 2015-01-24 (×3): qty 1

## 2015-01-24 MED ORDER — HEPARIN SODIUM (PORCINE) 1000 UNIT/ML IJ SOLN
INTRAMUSCULAR | Status: AC
  Filled 2015-01-24: qty 1

## 2015-01-24 MED ORDER — LIDOCAINE HCL (CARDIAC) 20 MG/ML IV SOLN
INTRAVENOUS | Status: DC | PRN
  Administered 2015-01-24: 60 mg via INTRAVENOUS

## 2015-01-24 MED ORDER — METOPROLOL TARTRATE 25 MG/10 ML ORAL SUSPENSION
12.5000 mg | Freq: Two times a day (BID) | ORAL | Status: DC
  Filled 2015-01-24 (×3): qty 5

## 2015-01-24 MED ORDER — ACETAMINOPHEN 160 MG/5ML PO SOLN: 650.0000 mg | Freq: Once | ORAL | Status: AC

## 2015-01-24 MED ORDER — SODIUM CHLORIDE 0.9 % IV SOLN
INTRAVENOUS | Status: DC
  Administered 2015-01-26: 13:00:00 via INTRAVENOUS
  Administered 2015-01-27: 50 mL/h via INTRAVENOUS
  Administered 2015-01-29: 10 mL/h via INTRAVENOUS

## 2015-01-24 MED ORDER — VECURONIUM BROMIDE 10 MG IV SOLR
INTRAVENOUS | Status: DC | PRN
  Administered 2015-01-24 (×2): 5 mg via INTRAVENOUS
  Administered 2015-01-24: 3 mg via INTRAVENOUS
  Administered 2015-01-24: 2 mg via INTRAVENOUS

## 2015-01-24 MED ORDER — LIDOCAINE HCL (CARDIAC) 20 MG/ML IV SOLN
INTRAVENOUS | Status: AC
  Filled 2015-01-24: qty 5

## 2015-01-24 MED ORDER — MUPIROCIN 2 % EX OINT
TOPICAL_OINTMENT | Freq: Every day | CUTANEOUS | Status: DC
Start: 2015-01-24 — End: 2015-01-24

## 2015-01-24 MED FILL — Mannitol IV Soln 20%: INTRAVENOUS | Qty: 500 | Status: AC

## 2015-01-24 MED FILL — Electrolyte-R (PH 7.4) Solution: INTRAVENOUS | Qty: 3000 | Status: AC

## 2015-01-24 MED FILL — Sodium Bicarbonate IV Soln 8.4%: INTRAVENOUS | Qty: 50 | Status: AC

## 2015-01-24 MED FILL — Heparin Sodium (Porcine) Inj 1000 Unit/ML: INTRAMUSCULAR | Qty: 10 | Status: AC

## 2015-01-24 MED FILL — Sodium Chloride IV Soln 0.9%: INTRAVENOUS | Qty: 3000 | Status: AC

## 2015-01-24 MED FILL — Lidocaine HCl IV Inj 20 MG/ML: INTRAVENOUS | Qty: 5 | Status: AC

## 2015-01-24 SURGICAL SUPPLY — 81 items
APL SKNCLS STERI-STRIP NONHPOA (GAUZE/BANDAGES/DRESSINGS) ×2
BAG DECANTER FOR FLEXI CONT (MISCELLANEOUS) ×3 IMPLANT
BANDAGE ELASTIC 4 VELCRO ST LF (GAUZE/BANDAGES/DRESSINGS) ×3 IMPLANT
BANDAGE ELASTIC 6 VELCRO ST LF (GAUZE/BANDAGES/DRESSINGS) ×3 IMPLANT
BENZOIN TINCTURE PRP APPL 2/3 (GAUZE/BANDAGES/DRESSINGS) ×1 IMPLANT
BLADE STERNUM SYSTEM 6 (BLADE) ×3 IMPLANT
BNDG GAUZE ELAST 4 BULKY (GAUZE/BANDAGES/DRESSINGS) ×3 IMPLANT
CANISTER SUCTION 2500CC (MISCELLANEOUS) ×3 IMPLANT
CATH CPB KIT GERHARDT (MISCELLANEOUS) ×3 IMPLANT
CATH THORACIC 28FR (CATHETERS) ×3 IMPLANT
CRADLE DONUT ADULT HEAD (MISCELLANEOUS) ×3 IMPLANT
DRAIN CHANNEL 28F RND 3/8 FF (WOUND CARE) ×3 IMPLANT
DRAPE CARDIOVASCULAR INCISE (DRAPES) ×3
DRAPE SLUSH/WARMER DISC (DRAPES) ×3 IMPLANT
DRAPE SRG 135X102X78XABS (DRAPES) ×2 IMPLANT
DRSG AQUACEL AG ADV 3.5X14 (GAUZE/BANDAGES/DRESSINGS) ×3 IMPLANT
ELECT BLADE 4.0 EZ CLEAN MEGAD (MISCELLANEOUS) ×6
ELECT REM PT RETURN 9FT ADLT (ELECTROSURGICAL) ×6
ELECTRODE BLDE 4.0 EZ CLN MEGD (MISCELLANEOUS) ×2 IMPLANT
ELECTRODE REM PT RTRN 9FT ADLT (ELECTROSURGICAL) ×4 IMPLANT
GAUZE SPONGE 4X4 12PLY STRL (GAUZE/BANDAGES/DRESSINGS) ×6 IMPLANT
GLOVE BIO SURGEON STRL SZ 6.5 (GLOVE) ×12 IMPLANT
GOWN STRL REUS W/ TWL LRG LVL3 (GOWN DISPOSABLE) ×8 IMPLANT
GOWN STRL REUS W/TWL LRG LVL3 (GOWN DISPOSABLE) ×24
HEMOSTAT POWDER SURGIFOAM 1G (HEMOSTASIS) ×9 IMPLANT
HEMOSTAT SURGICEL 2X14 (HEMOSTASIS) ×3 IMPLANT
KIT BASIN OR (CUSTOM PROCEDURE TRAY) ×3 IMPLANT
KIT CATH SUCT 8FR (CATHETERS) ×3 IMPLANT
KIT ROOM TURNOVER OR (KITS) ×3 IMPLANT
KIT SUCTION CATH 14FR (SUCTIONS) ×6 IMPLANT
KIT VASOVIEW W/TROCAR VH 2000 (KITS) ×3 IMPLANT
LEAD PACING MYOCARDI (MISCELLANEOUS) ×3 IMPLANT
LINE EXTENSION DELIVERY (MISCELLANEOUS) ×1 IMPLANT
MARKER GRAFT CORONARY BYPASS (MISCELLANEOUS) ×9 IMPLANT
NS IRRIG 1000ML POUR BTL (IV SOLUTION) ×15 IMPLANT
PACK OPEN HEART (CUSTOM PROCEDURE TRAY) ×3 IMPLANT
PAD ARMBOARD 7.5X6 YLW CONV (MISCELLANEOUS) ×6 IMPLANT
PAD ELECT DEFIB RADIOL ZOLL (MISCELLANEOUS) ×3 IMPLANT
PENCIL BUTTON HOLSTER BLD 10FT (ELECTRODE) ×4 IMPLANT
PUNCH AORTIC ROTATE  4.5MM 8IN (MISCELLANEOUS) ×1 IMPLANT
SET CARDIOPLEGIA MPS 5001102 (MISCELLANEOUS) ×1 IMPLANT
SPONGE GAUZE 4X4 12PLY STER LF (GAUZE/BANDAGES/DRESSINGS) ×1 IMPLANT
SPONGE LAP 18X18 X RAY DECT (DISPOSABLE) ×7 IMPLANT
STRIP CLOSURE SKIN 1/2X4 (GAUZE/BANDAGES/DRESSINGS) ×1 IMPLANT
SUT BONE WAX W31G (SUTURE) ×3 IMPLANT
SUT ETHIBOND 2 0 SH (SUTURE) ×12
SUT ETHIBOND 2 0 SH 36X2 (SUTURE) IMPLANT
SUT MNCRL AB 4-0 PS2 18 (SUTURE) ×1 IMPLANT
SUT PROLENE 3 0 SH1 36 (SUTURE) ×4 IMPLANT
SUT PROLENE 4 0 RB 1 (SUTURE) ×3
SUT PROLENE 4 0 TF (SUTURE) ×6 IMPLANT
SUT PROLENE 4-0 RB1 .5 CRCL 36 (SUTURE) IMPLANT
SUT PROLENE 5 0 C 1 36 (SUTURE) ×2 IMPLANT
SUT PROLENE 6 0 C 1 30 (SUTURE) ×2 IMPLANT
SUT PROLENE 6 0 CC (SUTURE) ×7 IMPLANT
SUT PROLENE 7 0 BV 1 (SUTURE) ×1 IMPLANT
SUT PROLENE 7 0 BV1 MDA (SUTURE) ×4 IMPLANT
SUT PROLENE 8 0 BV175 6 (SUTURE) ×7 IMPLANT
SUT SILK  1 MH (SUTURE) ×3
SUT SILK 1 MH (SUTURE) IMPLANT
SUT SILK 1 TIES 10X30 (SUTURE) ×1 IMPLANT
SUT SILK 2 0 SH CR/8 (SUTURE) ×2 IMPLANT
SUT SILK 3 0 SH CR/8 (SUTURE) ×1 IMPLANT
SUT SILK 4 0 TIE 10X30 (SUTURE) ×2 IMPLANT
SUT STEEL 6MS V (SUTURE) ×3 IMPLANT
SUT STEEL SZ 6 DBL 3X14 BALL (SUTURE) ×3 IMPLANT
SUT TEM PAC WIRE 2 0 SH (SUTURE) ×4 IMPLANT
SUT VIC AB 1 CTX 18 (SUTURE) ×6 IMPLANT
SUT VIC AB 2-0 CT1 27 (SUTURE) ×3
SUT VIC AB 2-0 CT1 TAPERPNT 27 (SUTURE) IMPLANT
SUT VIC AB 2-0 CTX 27 (SUTURE) ×2 IMPLANT
SUT VIC AB 3-0 X1 27 (SUTURE) ×2 IMPLANT
SUTURE E-PAK OPEN HEART (SUTURE) ×2 IMPLANT
SYSTEM SAHARA CHEST DRAIN ATS (WOUND CARE) ×3 IMPLANT
TABLE PACK (MISCELLANEOUS) ×1 IMPLANT
TOWEL OR 17X24 6PK STRL BLUE (TOWEL DISPOSABLE) ×6 IMPLANT
TOWEL OR 17X26 10 PK STRL BLUE (TOWEL DISPOSABLE) ×6 IMPLANT
TRAY FOLEY IC TEMP SENS 16FR (CATHETERS) ×3 IMPLANT
TUBING INSUFFLATION (TUBING) ×3 IMPLANT
UNDERPAD 30X30 INCONTINENT (UNDERPADS AND DIAPERS) ×3 IMPLANT
WATER STERILE IRR 1000ML POUR (IV SOLUTION) ×6 IMPLANT

## 2015-01-24 NOTE — OR Nursing (Signed)
RN contacted SICU with 45 minute call

## 2015-01-24 NOTE — H&P (Signed)
[address]       [place] [address]             Randall Ridlon Jr. [place] Medical Record #[id number] Date of Birth: 1940/09/26  Referring: Dr Randall Ali Primary Care: Randall Mc, MD  Chief Complaint:    "problem with Heart"   History of Present Illness:    Randall Ali. 74 y.o. male is seen in the office  today for symptomatic CAD after cath yesterday.   Patient has history of  CAD s/p remote MI s/p remote stenting to RCA in 03/2000, HTN, HLD, and skin CA who presented to [facility] on 7/30 with several month history of intermittent chest pain that was worse on 7/29 while at work.    He has  been having some exertional chest pain back in the spring of 2016, therefore a nuclear stress test was performed that showed no significant ischemia, no significant wall motion abnormality was noted, no EKG changes were noted, EF estimated at 54%, overall this was a low risk scan. He has continued to have exertional chest pain, at times similar to his prior MI.   While at work (works at the [employer]) on 7/29 he developed sudden onset of substernal chest pain, palpitations, and diaphoresis. Symptoms lasted until his arrival to [facility] when his palpitations and diaphoresis resolved. No associated SOB, nausea, vomiting, presyncope, or syncope. Upon his arrival to [facility] his tropon was found to be negative x 4. CXR with no active cardiopulmonary disease. CBC unremarkable. BMP unremarkable. He was discharged home and had outpatient  elective cardiac cath at [facility].       Current Activity/ Functional Status:  Patient is independent with mobility/ambulation, transfers, ADL's, IADL's.  mostly limited by chronic pain in his right foot from previous orthopedic surgery  Zubrod Score: At the time of surgery this patient's most appropriate activity status/level should be described as: '[]'$     0    Normal activity, no symptoms '[x]'$     1     Restricted in physical strenuous activity but ambulatory, able to do out light work '[]'$     2    Ambulatory and capable of self care, unable to do work activities, up and about               >50 % of waking hours                              '[]'$     3    Only limited self care, in bed greater than 50% of waking hours '[]'$     4    Completely disabled, no self care, confined to bed or chair '[]'$     5    Moribund   Past Medical History  Diagnosis Date  . CAD (coronary artery disease)     s/p inferior wall infarct in 10/01. has stent in Rt coronary artery. is due a stress myoview. does have some dyspnea on exertion  . SOB (shortness of breath) on exertion   . 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 Chest pain   . HLD (hyperlipidemia)     isdue followup lipids. on zocor 10 mg/day   . HTN (hypertension)   . Allergy  seasonal  . Cataract     removed  . Reflux esophagitis   . OSA (obstructive sleep apnea) 01/13/2015  . Erectile dysfunction   . Arthritis     all over- in general   . Cancer     skin, melanoma  . GERD (gastroesophageal reflux disease)     pt. denies     Past Surgical History  Procedure Laterality Date  . Cardiac catheterization  06/24/11  . Carotid stent  03/10/2011  . Arm surgery  2010  . Shoulder arthroscopy  2012  . Wrist surgery  2011  . Toe surgery  1994  . Nasal sinus surgery  2008    septpolasty, bilateral turbinate reduction  . Hammer toe surgery      right toe  . Colonoscopy  2010  . Cardiac catheterization N/A 01/18/2015    Procedure: Left Heart Cath with coronary angiography;  Surgeon: Minna Merritts, MD;  Location: [place] CV LAB;  Service: Cardiovascular;  Laterality: N/A;  . Cardiac catheterization N/A 01/18/2015    Procedure: Intravascular Pressure Wire/FFR Study;  Surgeon: [name], MD;  Location: [place] CV LAB;  Service: Cardiovascular;  Laterality: N/A;  . Eye surgery      lasik 15 yrs. ago, cataracts removed - both  eyes     Family History  Problem Relation Age of Onset  . Hypertension Mother   . Heart disease Mother   . Hypertension Father   . Diabetes Father   . Cancer Paternal Grandfather   . Heart disease Brother 63  . Colon cancer Neg Hx     Social History   Social History  . Marital Status: Single    Spouse Name: N/A  . Number of Children: N/A  . Years of Education: 12   Occupational History  . retired     [employer]   Social History Main Topics  . Smoking status: Former Smoker -- 2.50 packs/day for 40 years    Types: Cigarettes    Quit date: 03/24/2000  . Smokeless tobacco: Never Used  . Alcohol Use: 6.0 oz/week    10 Shots of liquor per week     Comment: occasional- 2 times per week   . Drug Use: No  . Sexual Activity: Not on file   Other Topics Concern  . Not on file   Social History Narrative   Singled; retired, part time; gets regular exercise.    Caffeine Use-yes          History  Smoking status  . Former Smoker -- 2.50 packs/day for 40 years  . Types: Cigarettes  . Quit date: 03/24/2000  Smokeless tobacco  . Never Used    History  Alcohol Use  . 6.0 oz/week  . 10 Shots of liquor per week    Comment: occasional- 2 times per week      No Known Allergies  Current Facility-Administered Medications  Medication Dose Route Frequency Provider Last Rate Last Dose  . aminocaproic acid (AMICAR) 10 g in sodium chloride 0.9 % 100 mL infusion   Intravenous To OR [name], MD      . cefUROXime (ZINACEF) 1.5 g in dextrose 5 % 50 mL IVPB  1.5 g Intravenous To OR [name], MD      . cefUROXime (ZINACEF) 750 mg in dextrose 5 % 50 mL IVPB  750 mg Intravenous To OR [name], MD      . dexmedetomidine (PRECEDEX) 400 MCG/100ML (4 mcg/mL)  infusion  0.1-0.7 mcg/kg/hr Intravenous To OR [name], MD      . DOPamine (INTROPIN) 800 mg in dextrose 5 % 250 mL (3.2 mg/mL) infusion  0-10 mcg/kg/min Intravenous To OR [name], MD        . EPINEPHrine (ADRENALIN) 4 mg in dextrose 5 % 250 mL (0.016 mg/mL) infusion  0-10 mcg/min Intravenous To OR [name], MD      . heparin 2,500 Units, papaverine 30 mg in electrolyte-148 (PLASMALYTE-148) 500 mL irrigation   Irrigation To OR [name], MD      . heparin 30,000 units/NS 1000 mL solution for CELLSAVER   Other To OR [name], MD      . insulin regular (NOVOLIN R,HUMULIN R) 250 Units in sodium chloride 0.9 % 250 mL (1 Units/mL) infusion   Intravenous To OR [name], MD      . magnesium sulfate (IV Push/IM) injection 40 mEq  40 mEq Other To OR [name], MD      . metoprolol tartrate (LOPRESSOR) tablet 12.5 mg  12.5 mg Oral To SS-Surg [name], MD      . nitroGLYCERIN 50 mg in dextrose 5 % 250 mL (0.2 mg/mL) infusion  2-200 mcg/min Intravenous To OR [name], MD      . phenylephrine (NEO-SYNEPHRINE) 20 mg in dextrose 5 % 250 mL (0.08 mg/mL) infusion  30-200 mcg/min Intravenous To OR [name], MD      . potassium chloride injection 80 mEq  80 mEq Other To OR [name], MD      . vancomycin (VANCOCIN) 1,500 mg in sodium chloride 0.9 % 250 mL IVPB  1,500 mg Intravenous To OR [name], MD          Review of Systems:     Cardiac Review of Systems: Y or N  Chest Pain [   y ]  Resting SOB [ n  ] Exertional SOB  Blue.Reese  ]  Orthopnea Blue.Reese  ]   Pedal Edema Florencio.Farrier   ]    Palpitations Blue.Reese  ] Syncope  [ n ]   Presyncope [n   ]  General Review of Systems: [Y] = yes [  ]=no Constitional: recent weight change [  ];  Wt loss over the last 3 months [   ] anorexia [  ]; fatigue [  ]; nausea [  ]; night sweats [  ]; fever [  ]; or chills [  ];          Dental: poor dentition[ n ]; Last Dentist visit:   Eye : blurred vision [  ]; diplopia [   ]; vision changes [  ];  Amaurosis fugax[  ]; Resp: cough [  ];  wheezing[  ];  hemoptysis[  ]; shortness of breath[  ]; paroxysmal nocturnal dyspnea[  ]; dyspnea on exertion[  ]; or  orthopnea[  ];  GI:  gallstones[  ], vomiting[  ];  dysphagia[  ]; melena[  ];  hematochezia [  ]; heartburn[  ];   Hx of  Colonoscopy[y  ]; GU: kidney stones [  ]; hematuria[  ];   dysuria [  ];  nocturia[  ];  history of     obstruction [  ]; urinary frequency [  ]             Skin: rash, swelling[  ];, hair loss[  ];  peripheral  edema[  ];  or itching[  ]; Musculosketetal: myalgias[  ];  joint swelling[ y ];  joint erythema[ y ];  joint pain[  ];  back pain[  ];  Heme/Lymph: bruising[y  ];  bleeding[  ];  anemia[  ];  Neuro: TIA[  ];  headaches[  ];  stroke[  ];  vertigo[  ];  seizures[  ];   paresthesias[  ];  difficulty walking[ y ];  Psych:depression[  ]; anxiety[  ];  Endocrine: diabetes[n  ];  thyroid dysfunction[ n ];  Immunizations: Flu up to date [ y ]; Pneumococcal up to date [ y ];  Other:  Physical Exam: BP 169/66 mmHg  Pulse 64  Temp(Src) 97.5 F (36.4 C) (Oral)  Resp 20  Ht '5\' 8"'$  (1.727 m)  Wt 210 lb (95.255 kg)  BMI 31.94 kg/m2  SpO2 96%  PHYSICAL EXAMINATION: General appearance: alert, cooperative, appears stated age and no distress Head: Normocephalic, without obvious abnormality, atraumatic Neck: no adenopathy, no carotid bruit, no JVD, supple, symmetrical, trachea midline and thyroid not enlarged, symmetric, no tenderness/mass/nodules Lymph nodes: Cervical, supraclavicular, and axillary nodes normal. Resp: clear to auscultation bilaterally Back: symmetric, no curvature. ROM normal. No CVA tenderness. Cardio: regular rate and rhythm, S1, S2 normal, no murmur, click, rub or gallop GI: soft, non-tender; bowel sounds normal; no masses,  no organomegaly Extremities: no edema, redness or tenderness in the calves or thighs and no ulcers, gangrene or trophic changes Neurologic: Grossly normal  Right groin cath site is without significant hematoma dressing is still in place. Patient has incisions on the dorsum of his right great and second toe - skin intact without  drainage Has mild pedal edema right greater than left PT and DP pulses are palpable bilaterally   Diagnostic Studies & Laboratory data:     Recent Radiology Findings:   Dg Chest 2 View  01/22/2015   CLINICAL DATA:  Preoperative evaluation for open heart surgery  EXAM: CHEST - 2 VIEW  COMPARISON:  01/05/2015  FINDINGS: Cardiac shadow is stable. The lungs are well aerated bilaterally no focal infiltrate or sizable effusion is seen. No acute bony abnormality is noted.  IMPRESSION: No active disease.   Electronically Signed   By: Inez Catalina M.D.   On: 01/22/2015 10:35   Ct Chest Wo Contrast  01/23/2015   CLINICAL DATA:  Chest pain. Scheduled for cardiac surgery tomorrow. History of pulmonary nodule.  EXAM: CT CHEST WITHOUT CONTRAST  TECHNIQUE: Multidetector CT imaging of the chest was performed following the standard protocol without IV contrast.  COMPARISON:  Radiographs 01/22/2015.  CT 01/05/2012 and 05/23/2013.  FINDINGS: Mediastinum/Nodes: There are no enlarged mediastinal, hilar or axillary lymph nodes.Hilar assessment is limited by the lack of intravenous contrast, although the hilar contours appear unchanged. The thyroid gland, trachea and esophagus demonstrate no significant findings. The heart size is normal. There is no pericardial effusion. There is diffuse atherosclerosis of the aorta, great vessels and coronary arteries.  Lungs/Pleura: There is no pleural effusion. Stable mild emphysema with mild dependent atelectasis or scarring in both lungs. Right upper lobe noncalcified pulmonary nodule has not significantly changed, measuring 7 mm on image 22. No new or enlarging nodules.  Upper abdomen: Stable appearance with probable small cysts in the left hepatic lobe. Renovascular calcifications noted.  Musculoskeletal/Chest wall: There is no chest wall mass or suspicious osseous finding. Stable degenerative changes throughout the thoracic spine.  IMPRESSION: 1. Stable examination demonstrating no  acute findings. 2. Stable noncalcified  right upper lobe pulmonary nodule. 3. Moderate atherosclerosis.   Electronically Signed   By: Richardean Sale M.D.   On: 01/23/2015 10:49     I have independently reviewed the above radiologic studies.  Recent Lab Findings: Lab Results  Component Value Date   WBC 8.0 01/22/2015   HGB 16.2 01/22/2015   HCT 46.6 01/22/2015   PLT 208 01/22/2015   GLUCOSE 102* 01/22/2015   CHOL 144 11/24/2014   TRIG 71.0 11/24/2014   HDL 46.90 11/24/2014   LDLDIRECT 78.0 04/05/2013   LDLCALC 83 11/24/2014   ALT 19 01/22/2015   AST 18 01/22/2015   NA 138 01/22/2015   K 4.5 01/22/2015   CL 110 01/22/2015   CREATININE 0.91 01/22/2015   BUN 14 01/22/2015   CO2 18* 01/22/2015   TSH 1.06 11/24/2014   INR 1.14 01/22/2015   HGBA1C 5.8* 01/22/2015   CATH:  Procedural Findings:  Coronary angiography:  Coronary dominance: Right Small caliber vessels particularly the proximal LAD, proximal circumflex  Left mainstem:  Large vessel that bifurcates into the left circumflex and LAD. No significant stenoses noted  Left anterior descending (LAD):  Large vessel that extends to the apical region, one large diagonal branch. Severe ostial LAD disease estimated at 70-80%. FFR of this lesion 0.69 per Dr. Fletcher Anon.  Left circumflex (LCx):  large size vessel with 2 large high OM's, both with 70% lesions OM1 with lesion in the mid section, OM 2 with proximal and mid lesions. .   Right coronary artery (RCA): Large vessel with patent ostial and proximal stent, mild in-stent restenosis estimated at 30% in the mid RCA, also with 70% distal RCA lesion  Left ventriculography: Left ventricular systolic function is normal, LVEF is estimated at 55-65%, there is no significant mitral regurgitation Or aortic valve stenosis  There were no immediate complications during the procedure.  There were no immediate complications during the procedure.    Conclusion     Ost LAD lesion,  80% stenosed.  Dist RCA lesion, 70% stenosed.  Mid RCA lesion, 30% stenosed. The lesion was previously treated with a stent (unknown type) .  1st Mrg lesion, 70% stenosed.  2nd Mrg-2 lesion, 70% stenosed.  2nd Mrg-1 lesion, 70% stenosed.  The left ventricular systolic function is normal.  Final Conclusions:  Right dominant coronary system Severe ostial LAD disease, moderate to severe OM1 and OM 2 as well as RCA disease.  Recommendations:  Case was discussed with Dr. Fletcher Anon. FFR on the ostial LAD was performed with 0.69 measured in dictating significant ostial LAD lesion. Given the circumflex disease, RCA disease, hemodynamically significant ostial LAD lesion, no intervention was performed, he'll be referred for evaluation for CABG in [place]. Appointment will be made for him.  [name] 01/18/2015, 2:30 PM     ECHO:01/23/2015 Study Conclusions  - Left ventricle: The cavity size was normal. Wall thickness was increased in a pattern of mild LVH. There was mild focal basal hypertrophy of the septum. Systolic function was normal. The estimated ejection fraction was in the range of 55% to 60%. Wall motion was normal; there were no regional wall motion abnormalities. Doppler parameters are consistent with abnormal left ventricular relaxation (grade 1 diastolic dysfunction). - Mitral valve: Calcified annulus. - Left atrium: The atrium was mildly dilated. - Right atrium: The atrium was mildly dilated.  Impressions:  - Normal LV systolic function; grade 1 diastolic dysfunction; mild biatrial enlargement; trace MR and TR.  Assessment / Plan:   #1 symptomatic 3  vessel coronary artery disease- I reviewed the films with the patient and his daughter and agree with the recommendation to proceed with coronary artery bypass graft. The risks and options are discussed with the patient in detail and he is willing to proceed.  The goals risks and alternatives of the  planned surgical procedure CABG  have been discussed with the patient and his daughter in detail. The risks of the procedure including death, infection, stroke, myocardial infarction, bleeding, blood transfusion have all been discussed specifically.  I have quoted [name] a 3 % of perioperative mortality and a complication rate as high as 30 %. [name] questions have been answered.[name] is willing  to proceed with the planned procedure.  #2 history of right pulmonary nodule- stable for several years but no CT scan since December 2014-  Repeat CT  of the chest without contrast done-: 1. Stable examination demonstrating no acute findings. 2. Stable noncalcified right upper lobe pulmonary nodule. 3. Moderate atherosclerosis.    [name] MD      [address] [place] [address] Office [phone]   Beeper [phone]  01/24/2015 7:10 AM

## 2015-01-24 NOTE — Progress Notes (Signed)
NIF -34 cmh20, VC 900 cc, RR 21, 95% sat, able to follow commands.

## 2015-01-24 NOTE — Anesthesia Preprocedure Evaluation (Addendum)
Anesthesia Evaluation  Patient identified by MRN, date of birth, ID band Patient awake    Reviewed: Allergy & Precautions, NPO status , Patient's Chart, lab work & pertinent test results  Airway Mallampati: II  TM Distance: >3 FB Neck ROM: Full    Dental   Pulmonary shortness of breath, sleep apnea , former smoker,  breath sounds clear to auscultation        Cardiovascular hypertension, + angina + CAD, + Past MI and + Peripheral Vascular Disease Rhythm:Regular Rate:Normal     Neuro/Psych  Headaches,    GI/Hepatic Neg liver ROS, GERD-  ,  Endo/Other    Renal/GU negative Renal ROS     Musculoskeletal   Abdominal   Peds  Hematology   Anesthesia Other Findings   Reproductive/Obstetrics                            Anesthesia Physical Anesthesia Plan  ASA: III  Anesthesia Plan: General   Post-op Pain Management:    Induction: Intravenous  Airway Management Planned: Oral ETT  Additional Equipment: Arterial line and PA Cath  Intra-op Plan:   Post-operative Plan: Post-operative intubation/ventilation  Informed Consent: I have reviewed the patients History and Physical, chart, labs and discussed the procedure including the risks, benefits and alternatives for the proposed anesthesia with the patient or authorized representative who has indicated his/her understanding and acceptance.   Dental advisory given  Plan Discussed with: CRNA and Anesthesiologist  Anesthesia Plan Comments:         Anesthesia Quick Evaluation

## 2015-01-24 NOTE — OR Nursing (Signed)
RN contacted SICU (Patty) for 30 minute call (plans to go to Room 4)

## 2015-01-24 NOTE — Progress Notes (Signed)
      [address]       [place] [address]             [phone]      S/p CABG x 4  Intubated, waking up and ready for extubation  BP 105/64 mmHg  Pulse 90  Temp(Src) 98.2 F (36.8 C) (Oral)  Resp 15  Ht '5\' 8"'$  (1.727 m)  Wt 210 lb (95.255 kg)  BMI 31.94 kg/m2  SpO2 96%   Intake/Output Summary (Last 24 hours) at 01/24/15 1854 Last data filed at 01/24/15 1806  Gross per 24 hour  Intake 4046.42 ml  Output   1410 ml  Net 2636.42 ml    Doing well  Remo Lipps C. Roxan Hockey, MD [facility] [phone]

## 2015-01-24 NOTE — Brief Op Note (Signed)
      [address]       [place] [address]             [phone]     01/24/2015  2:21 PM  PATIENT:  Randall Ali.  74 y.o. male  PRE-OPERATIVE DIAGNOSIS:  CAD  POST-OPERATIVE DIAGNOSIS:  CAD  PROCEDURE:  Procedure(s): CORONARY ARTERY BYPASS GRAFTING x 4 (LIMA-LAD, SVG-Int 1- Int 2, SVG-PD) ENDOSCOPIC GREATER SAPHENOUS VEIN HARVEST LEFT LEG (N/A) TRANSESOPHAGEAL ECHOCARDIOGRAM (TEE) (N/A)  SURGEON:  Surgeon(s) and Role:    * [name], MD - Primary  PHYSICIAN ASSISTANT: Trixie Dredge   ANESTHESIA:   general  EBL:  Total I/O In: 6256 [I.V.:2500; Blood:795; IV Piggyback:250] Out: 940 [Urine:940]  BLOOD ADMINISTERED:none  DRAINS: Nasogastric Tube, Urinary Catheter (Foley) and 28 chest tube  Chest Tube(s) in the left chest and 28 blake in the mediastinum   LOCAL MEDICATIONS USED:  NONE  SPECIMEN:  No Specimen  DISPOSITION OF SPECIMEN:  N/A  COUNTS:  YES  TOURNIQUET:  * No tourniquets in log *  DICTATION: .Dragon Dictation  PLAN OF CARE: Admit to inpatient   PATIENT DISPOSITION:  ICU - intubated and hemodynamically stable.   Delay start of Pharmacological VTE agent (>24hrs) due to surgical blood loss or risk of bleeding: yes

## 2015-01-24 NOTE — Anesthesia Procedure Notes (Addendum)
Procedure Name: Intubation Date/Time: 01/24/2015 8:46 AM Performed by: Judeth Cornfield T Pre-anesthesia Checklist: Patient identified, Emergency Drugs available, Suction available, Patient being monitored and Timeout performed Patient Re-evaluated:Patient Re-evaluated prior to inductionOxygen Delivery Method: Circle system utilized Preoxygenation: Pre-oxygenation with 100% oxygen Intubation Type: IV induction Laryngoscope Size: Mac and 4 Grade View: Grade I Tube type: Subglottic suction tube Tube size: 7.5 mm Number of attempts: 1 Airway Equipment and Method: Video-laryngoscopy and Rigid stylet Placement Confirmation: ETT inserted through vocal cords under direct vision,  breath sounds checked- equal and bilateral and positive ETCO2 Secured at: 22 cm Tube secured with: Tape Dental Injury: Teeth and Oropharynx as per pre-operative assessment  Difficulty Due To: Difficulty was anticipated, Difficult Airway- due to limited oral opening and Difficult Airway- due to reduced neck mobility Future Recommendations: Recommend- induction with short-acting agent, and alternative techniques readily available

## 2015-01-24 NOTE — Progress Notes (Signed)
  Echocardiogram Echocardiogram Transesophageal has been performed.  Randall Ali 01/24/2015, 9:54 AM

## 2015-01-24 NOTE — Transfer of Care (Signed)
Immediate Anesthesia Transfer of Care Note  Patient: Randall Ali.  Procedure(s) Performed: Procedure(s): CORONARY ARTERY BYPASS GRAFTING x 4 (LIMA-LAD, SVG-Int 1- Int 2, SVG-PD) ENDOSCOPIC GREATER SAPHENOUS VEIN HARVEST LEFT LEG (N/A) TRANSESOPHAGEAL ECHOCARDIOGRAM (TEE) (N/A)  Patient Location: SICU  Anesthesia Type:General  Level of Consciousness: sedated and Patient remains intubated per anesthesia plan  Airway & Oxygen Therapy: Patient remains intubated per anesthesia plan and Patient placed on Ventilator (see vital sign flow sheet for setting)  Post-op Assessment: Report given to RN and Post -op Vital signs reviewed and stable  Post vital signs: Reviewed and stable  Last Vitals:  Filed Vitals:   01/24/15 0705  BP: 169/66  Pulse: 64  Temp: 36.4 C  Resp: 20    Complications: No apparent anesthesia complications

## 2015-01-24 NOTE — OR Nursing (Signed)
RN contacted SICU, confirmed Room 4 with Tamela Oddi

## 2015-01-24 NOTE — Brief Op Note (Signed)
01/24/2015  12:15 PM  PATIENT:  Randall Ali.  75 y.o. male  PRE-OPERATIVE DIAGNOSIS:  CAD  POST-OPERATIVE DIAGNOSIS:  CAD  PROCEDURE:   CORONARY ARTERY BYPASS GRAFTING x 4 (LIMA-LAD, SVG-Int 1- Int 2, SVG-PD) ENDOSCOPIC GREATER SAPHENOUS VEIN HARVEST LEFT LEG  SURGEON:  [name], MD  ASSISTANT: Suzzanne Cloud, PA-C  ANESTHESIA:   general  PATIENT CONDITION:  ICU - intubated and hemodynamically stable.  PRE-OPERATIVE WEIGHT: 95 kg

## 2015-01-24 NOTE — Progress Notes (Signed)
Patient is ready to be extubated, RT unable to as of now due to new heart coming from OR, RT to come extubate after new heart is settled, family at bedside visiting.  Rowe Pavy, RN

## 2015-01-24 NOTE — Anesthesia Postprocedure Evaluation (Signed)
  Anesthesia Post-op Note  Patient: Randall Ali.  Procedure(s) Performed: Procedure(s): CORONARY ARTERY BYPASS GRAFTING x 4 (LIMA-LAD, SVG-Int 1- Int 2, SVG-PD) ENDOSCOPIC GREATER SAPHENOUS VEIN HARVEST LEFT LEG (N/A) TRANSESOPHAGEAL ECHOCARDIOGRAM (TEE) (N/A)  Patient Location: PACU  Anesthesia Type:General  Level of Consciousness: awake  Airway and Oxygen Therapy: Patient Spontanous Breathing  Post-op Pain: mild  Post-op Assessment: Post-op Vital signs reviewed              Post-op Vital Signs: Reviewed  Last Vitals:  Filed Vitals:   01/24/15 1800  BP: 105/64  Pulse: 90  Temp: 36.8 C  Resp: 14    Complications: No apparent anesthesia complications

## 2015-01-25 ENCOUNTER — Inpatient Hospital Stay (HOSPITAL_COMMUNITY): Payer: PPO

## 2015-01-25 ENCOUNTER — Encounter (HOSPITAL_COMMUNITY): Payer: Self-pay | Admitting: *Deleted

## 2015-01-25 LAB — POCT I-STAT, CHEM 8
BUN: 20 mg/dL (ref 6–20)
Calcium, Ion: 1.17 mmol/L (ref 1.13–1.30)
Chloride: 104 mmol/L (ref 101–111)
Creatinine, Ser: 1.1 mg/dL (ref 0.61–1.24)
Glucose, Bld: 201 mg/dL — ABNORMAL HIGH (ref 65–99)
HCT: 44 % (ref 39.0–52.0)
Hemoglobin: 15 g/dL (ref 13.0–17.0)
Potassium: 5.3 mmol/L — ABNORMAL HIGH (ref 3.5–5.1)
Sodium: 132 mmol/L — ABNORMAL LOW (ref 135–145)
TCO2: 21 mmol/L (ref 0–100)

## 2015-01-25 LAB — GLUCOSE, CAPILLARY
Glucose-Capillary: 107 mg/dL — ABNORMAL HIGH (ref 65–99)
Glucose-Capillary: 113 mg/dL — ABNORMAL HIGH (ref 65–99)
Glucose-Capillary: 115 mg/dL — ABNORMAL HIGH (ref 65–99)
Glucose-Capillary: 119 mg/dL — ABNORMAL HIGH (ref 65–99)
Glucose-Capillary: 138 mg/dL — ABNORMAL HIGH (ref 65–99)
Glucose-Capillary: 139 mg/dL — ABNORMAL HIGH (ref 65–99)
Glucose-Capillary: 141 mg/dL — ABNORMAL HIGH (ref 65–99)
Glucose-Capillary: 158 mg/dL — ABNORMAL HIGH (ref 65–99)
Glucose-Capillary: 187 mg/dL — ABNORMAL HIGH (ref 65–99)
Glucose-Capillary: 203 mg/dL — ABNORMAL HIGH (ref 65–99)
Glucose-Capillary: 98 mg/dL (ref 65–99)

## 2015-01-25 LAB — BASIC METABOLIC PANEL
Anion gap: 5 (ref 5–15)
BUN: 13 mg/dL (ref 6–20)
CO2: 23 mmol/L (ref 22–32)
Calcium: 8.5 mg/dL — ABNORMAL LOW (ref 8.9–10.3)
Chloride: 108 mmol/L (ref 101–111)
Creatinine, Ser: 0.93 mg/dL (ref 0.61–1.24)
GFR calc Af Amer: >60 mL/min (ref >60)
GFR calc non Af Amer: >60 mL/min (ref >60)
Glucose, Bld: 119 mg/dL — ABNORMAL HIGH (ref 65–99)
Potassium: 4.5 mmol/L (ref 3.5–5.1)
Sodium: 136 mmol/L (ref 135–145)

## 2015-01-25 LAB — MAGNESIUM
Magnesium: 2.1 mg/dL (ref 1.7–2.4)
Magnesium: 2.2 mg/dL (ref 1.7–2.4)

## 2015-01-25 LAB — CREATININE, SERUM
Creatinine, Ser: 1.15 mg/dL (ref 0.61–1.24)
GFR calc Af Amer: >60 mL/min
GFR calc non Af Amer: >60 mL/min

## 2015-01-25 LAB — CBC
HCT: 38.7 % — ABNORMAL LOW (ref 39.0–52.0)
HCT: 40.6 % (ref 39.0–52.0)
Hemoglobin: 13.4 g/dL (ref 13.0–17.0)
Hemoglobin: 14 g/dL (ref 13.0–17.0)
MCH: 30.6 pg (ref 26.0–34.0)
MCH: 30.6 pg (ref 26.0–34.0)
MCHC: 34.6 g/dL (ref 30.0–36.0)
MCHC: 34.5 g/dL (ref 30.0–36.0)
MCV: 88.4 fL (ref 78.0–100.0)
MCV: 88.8 fL (ref 78.0–100.0)
Platelets: 175 10*3/uL (ref 150–400)
Platelets: 189 K/uL (ref 150–400)
RBC: 4.38 MIL/uL (ref 4.22–5.81)
RBC: 4.57 MIL/uL (ref 4.22–5.81)
RDW: 12.9 % (ref 11.5–15.5)
RDW: 13.2 % (ref 11.5–15.5)
WBC: 17.3 10*3/uL — ABNORMAL HIGH (ref 4.0–10.5)
WBC: 22.9 K/uL — ABNORMAL HIGH (ref 4.0–10.5)

## 2015-01-25 MED ORDER — METOPROLOL TARTRATE 25 MG PO TABS
25.0000 mg | ORAL_TABLET | Freq: Two times a day (BID) | ORAL | Status: DC
  Administered 2015-01-25: 12.5 mg via ORAL
  Administered 2015-01-26 (×2): 25 mg via ORAL
  Filled 2015-01-25 (×4): qty 1

## 2015-01-25 MED ORDER — ENOXAPARIN SODIUM 30 MG/0.3ML ~~LOC~~ SOLN
30.0000 mg | SUBCUTANEOUS | Status: DC
  Administered 2015-01-25 – 2015-02-08 (×15): 30 mg via SUBCUTANEOUS
  Filled 2015-01-25 (×17): qty 0.3

## 2015-01-25 MED ORDER — METOPROLOL TARTRATE 25 MG/10 ML ORAL SUSPENSION
12.5000 mg | Freq: Two times a day (BID) | ORAL | Status: DC
  Filled 2015-01-25 (×4): qty 5

## 2015-01-25 MED ORDER — FUROSEMIDE 10 MG/ML IJ SOLN
INTRAMUSCULAR | Status: AC
  Administered 2015-01-25: 40 mg via INTRAVENOUS
  Filled 2015-01-25: qty 4

## 2015-01-25 MED ORDER — FUROSEMIDE 10 MG/ML IJ SOLN
40.0000 mg | Freq: Once | INTRAMUSCULAR | Status: AC
  Administered 2015-01-25: 40 mg via INTRAVENOUS

## 2015-01-25 MED ORDER — LISINOPRIL 5 MG PO TABS
5.0000 mg | ORAL_TABLET | Freq: Every day | ORAL | Status: DC
  Administered 2015-01-25 – 2015-01-26 (×2): 5 mg via ORAL
  Filled 2015-01-25 (×3): qty 1

## 2015-01-25 MED ORDER — INSULIN ASPART 100 UNIT/ML ~~LOC~~ SOLN
0.0000 [IU] | SUBCUTANEOUS | Status: DC
  Administered 2015-01-25: 2 [IU] via SUBCUTANEOUS
  Administered 2015-01-25: 8 [IU] via SUBCUTANEOUS
  Administered 2015-01-25: 4 [IU] via SUBCUTANEOUS
  Administered 2015-01-26 (×4): 2 [IU] via SUBCUTANEOUS
  Administered 2015-01-26: 4 [IU] via SUBCUTANEOUS
  Administered 2015-01-27 – 2015-01-29 (×11): 2 [IU] via SUBCUTANEOUS
  Administered 2015-01-29: 4 [IU] via SUBCUTANEOUS
  Administered 2015-01-30 (×3): 2 [IU] via SUBCUTANEOUS
  Administered 2015-01-30: 4 [IU] via SUBCUTANEOUS
  Administered 2015-01-30 – 2015-01-31 (×6): 2 [IU] via SUBCUTANEOUS
  Administered 2015-01-31: 4 [IU] via SUBCUTANEOUS
  Administered 2015-02-01 (×2): 2 [IU] via SUBCUTANEOUS
  Administered 2015-02-01: 4 [IU] via SUBCUTANEOUS
  Administered 2015-02-01 – 2015-02-02 (×3): 2 [IU] via SUBCUTANEOUS
  Administered 2015-02-02: 12 [IU] via SUBCUTANEOUS
  Administered 2015-02-02: 2 [IU] via SUBCUTANEOUS
  Administered 2015-02-02: 4 [IU] via SUBCUTANEOUS
  Administered 2015-02-02 – 2015-02-03 (×2): 2 [IU] via SUBCUTANEOUS

## 2015-01-25 MED ORDER — INSULIN ASPART 100 UNIT/ML ~~LOC~~ SOLN: 0.0000 [IU] | SUBCUTANEOUS | Status: DC

## 2015-01-25 MED ORDER — LEVALBUTEROL HCL 0.63 MG/3ML IN NEBU
0.6300 mg | INHALATION_SOLUTION | Freq: Four times a day (QID) | RESPIRATORY_TRACT | Status: DC
  Administered 2015-01-25 – 2015-02-06 (×44): 0.63 mg via RESPIRATORY_TRACT
  Filled 2015-01-25 (×94): qty 3

## 2015-01-25 MED FILL — Potassium Chloride Inj 2 mEq/ML: INTRAVENOUS | Qty: 40 | Status: AC

## 2015-01-25 MED FILL — Magnesium Sulfate Inj 50%: INTRAMUSCULAR | Qty: 10 | Status: AC

## 2015-01-25 MED FILL — Heparin Sodium (Porcine) Inj 1000 Unit/ML: INTRAMUSCULAR | Qty: 30 | Status: AC

## 2015-01-25 NOTE — Progress Notes (Signed)
Patient ID: Regis Hinton., male   DOB: 1940/08/09, 74 y.o.   MRN: [id number] EVENING ROUNDS NOTE :     [address]       [place] [address]             [phone]                 1 Day Post-Op Procedure(s) (LRB): CORONARY ARTERY BYPASS GRAFTING x 4 (LIMA-LAD, SVG-Int 1- Int 2, SVG-PD) ENDOSCOPIC GREATER SAPHENOUS VEIN HARVEST LEFT LEG (N/A) TRANSESOPHAGEAL ECHOCARDIOGRAM (TEE) (N/A)  Total Length of Stay:  LOS: 1 day  BP 182/86 mmHg  Pulse 104  Temp(Src) 97 F (36.1 C) (Core (Comment))  Resp 16  Ht '5\' 8"'$  (1.727 m)  Wt 218 lb 12.8 oz (99.247 kg)  BMI 33.28 kg/m2  SpO2 91%  .Intake/Output      08/18 0701 - 08/19 0700   P.O. 600   I.V. (mL/kg) 319 (3.2)   Blood    IV Piggyback 50   Total Intake(mL/kg) 969 (9.8)   Urine (mL/kg/hr) 135 (0.1)   Emesis/NG output    Chest Tube 430 (0.4)   Total Output 565   Net +404         . sodium chloride Stopped (01/25/15 0900)  . sodium chloride    . sodium chloride    . dexmedetomidine Stopped (01/24/15 1710)  . lactated ringers 20 mL/hr at 01/25/15 1900  . lactated ringers    . nitroGLYCERIN Stopped (01/25/15 1300)  . phenylephrine (NEO-SYNEPHRINE) Adult infusion Stopped (01/25/15 0500)     Lab Results  Component Value Date   WBC 22.9* 01/25/2015   HGB 15.0 01/25/2015   HCT 44.0 01/25/2015   PLT 189 01/25/2015   GLUCOSE 201* 01/25/2015   CHOL 144 11/24/2014   TRIG 71.0 11/24/2014   HDL 46.90 11/24/2014   LDLDIRECT 78.0 04/05/2013   LDLCALC 83 11/24/2014   ALT 19 01/22/2015   AST 18 01/22/2015   NA 132* 01/25/2015   K 5.3* 01/25/2015   CL 104 01/25/2015   CREATININE 1.10 01/25/2015   BUN 20 01/25/2015   CO2 23 01/25/2015   TSH 1.06 11/24/2014   PSA 0.28 11/24/2014   INR 1.34 01/24/2015   HGBA1C 5.8* 01/22/2015   Stable day, off drips   [name] MD  Beeper [phone] Office [phone] 01/25/2015 7:07 PM

## 2015-01-25 NOTE — Progress Notes (Signed)
Patient ID: Randall Lagrange., male   DOB: 19-Nov-1940, 74 y.o.   MRN: [id number] T[facility] DAILY ICU PROGRESS NOTE                   [address]            ,[place] [address]          [phone]   1 Day Post-Op Procedure(s) (LRB): CORONARY ARTERY BYPASS GRAFTING x 4 (LIMA-LAD, SVG-Int 1- Int 2, SVG-PD) ENDOSCOPIC GREATER SAPHENOUS VEIN HARVEST LEFT LEG (N/A) TRANSESOPHAGEAL ECHOCARDIOGRAM (TEE) (N/A)  Total Length of Stay:  LOS: 1 day   Subjective: Awake and alert, neuro intact  Objective: Vital signs in last 24 hours: Temp:  [95.7 F (35.4 C)-99.1 F (37.3 C)] 98.8 F (37.1 C) (08/18 0715) Pulse Rate:  [85-103] 101 (08/18 0715) Cardiac Rhythm:  [-] Sinus tachycardia (08/18 0700) Resp:  [0-34] 24 (08/18 0715) BP: (88-145)/(47-81) 132/71 mmHg (08/18 0700) SpO2:  [91 %-97 %] 91 % (08/18 0715) Arterial Line BP: (71-170)/(45-105) 158/63 mmHg (08/18 0715) FiO2 (%):  [40 %-50 %] 45 % (08/18 0500) Weight:  [218 lb 12.8 oz (99.247 kg)] 218 lb 12.8 oz (99.247 kg) (08/18 0421)  Filed Weights   01/24/15 0705 01/25/15 0421  Weight: 210 lb (95.255 kg) 218 lb 12.8 oz (99.247 kg)    Weight change:    Hemodynamic parameters for last 24 hours: PAP: (21-51)/(11-30) 36/19 mmHg CO:  [4.5 L/min-6.6 L/min] 5.7 L/min CI:  [2.2 L/min/m2-3.2 L/min/m2] 2.7 L/min/m2  Intake/Output from previous day: 08/17 0701 - 08/18 0700 In: 4997.6 [P.O.:100; I.V.:3302.6; Blood:795; IV Piggyback:800] Out: 2235 [Urine:1795; Emesis/NG output:70; Chest Tube:370]  Intake/Output this shift:    Current Meds: Scheduled Meds: . acetaminophen  1,000 mg Oral 4 times per day   Or  . acetaminophen (TYLENOL) oral liquid 160 mg/5 mL  1,000 mg Per Tube 4 times per day  . antiseptic oral rinse  7 mL Mouth Rinse BID  . aspirin EC  325 mg Oral Daily   Or  . aspirin  324 mg Per Tube Daily  . bisacodyl  10 mg Oral Daily   Or  . bisacodyl  10 mg Rectal Daily  . cefUROXime (ZINACEF)  IV  1.5 g  Intravenous Q12H  . Chlorhexidine Gluconate Cloth  6 each Topical Q0600  . docusate sodium  200 mg Oral Daily  . famotidine (PEPCID) IV  20 mg Intravenous Q12H  . insulin aspart  0-24 Units Subcutaneous 6 times per day  . metoprolol tartrate  12.5 mg Oral BID   Or  . metoprolol tartrate  12.5 mg Per Tube BID  . mupirocin ointment   Nasal BID  . pantoprazole  40 mg Oral Daily  . simvastatin  20 mg Oral QHS  . sodium chloride  3 mL Intravenous Q12H   Continuous Infusions: . sodium chloride 20 mL/hr at 01/25/15 0700  . sodium chloride    . sodium chloride    . dexmedetomidine Stopped (01/24/15 1710)  . lactated ringers 20 mL/hr at 01/25/15 0700  . lactated ringers    . nitroGLYCERIN 30 mcg/min (01/25/15 0700)  . phenylephrine (NEO-SYNEPHRINE) Adult infusion Stopped (01/25/15 0500)   PRN Meds:.sodium chloride, albumin human, lactated ringers, metoprolol, midazolam, morphine injection, ondansetron (ZOFRAN) IV, oxyCODONE, sodium chloride, traMADol  General appearance: alert, cooperative and no distress Neurologic: intact Heart: regular rate and rhythm, S1, S2 normal, no murmur, click, rub or gallop Lungs: diminished breath sounds bibasilar Abdomen: soft, non-tender;  bowel sounds normal; no masses,  no organomegaly Extremities: extremities normal, atraumatic, no cyanosis or edema and Homans sign is negative, no sign of DVT Wound: sternum stable  Lab Results: CBC: Recent Labs  01/24/15 2009 01/24/15 2017 01/25/15 0426  WBC 17.0*  --  17.3*  HGB 14.0 14.3 13.4  HCT 40.8 42.0 38.7*  PLT 167  --  175   BMET:  Recent Labs  01/22/15 1023  01/24/15 2017 01/25/15 0426  NA 138  < > 136 136  K 4.5  < > 4.8 4.5  CL 110  < > 105 108  CO2 18*  --   --  23  GLUCOSE 102*  < > 129* 119*  BUN 14  < > 15 13  CREATININE 0.91  < > 0.90 0.93  CALCIUM 9.4  --   --  8.5*  < > = values in this interval not displayed.  PT/INR:  Recent Labs  01/24/15 1436  LABPROT 16.7*  INR 1.34    Radiology: Dg Chest Port 1 View  01/25/2015   CLINICAL DATA:  CABG.  EXAM: PORTABLE CHEST - 1 VIEW  COMPARISON:  01/24/2015 .  FINDINGS: Interim removal of endotracheal tube and NG tube. Swan-Ganz catheter and left chest tube in stable position. Mediastinum drainage catheter stable position. Prior CABG. Cardiomegaly. Patch that bibasilar atelectasis with left lower lobe infiltrate again noted. No pleural effusion or pneumothorax.  IMPRESSION: 1. Interim removal of endotracheal tube and NG tube. Remaining lines and tubes in stable position. 2. Stable bibasilar atelectasis and left lower lobe infiltrate. 3. Prior CABG. Heart size and pulmonary vascularity are stable. No pulmonary venous congestion .   Electronically Signed   By: Marcello Moores  Register   On: 01/25/2015 07:22   Dg Chest Port 1 View  01/24/2015   CLINICAL DATA:  74 year old male status post CABG. Initial encounter.  EXAM: PORTABLE CHEST - 1 VIEW  COMPARISON:  Chest CT 01/23/2015 and earlier  FINDINGS: Portable AP semi upright view at 1432 hours.  Endotracheal tube tip in good position between the clavicles and carina. Enteric tube courses to the left upper quadrant, side hole not identified. Right IJ approach Swan-Ganz catheter, tip at the main pulmonary outflow tract. Left chest tube and mediastinal tube in place.  No pneumothorax or pulmonary edema. Mild perihilar atelectasis. No pleural effusion identified. Sequelae of CABG. Stable cardiac size and mediastinal contours.  IMPRESSION: 1. Lines and tubes appear probe Lee placed as above. 2. Mild atelectasis.  No pneumothorax or pulmonary edema.   Electronically Signed   By: Genevie Ann M.D.   On: 01/24/2015 14:43     Assessment/Plan: S/P Procedure(s) (LRB): CORONARY ARTERY BYPASS GRAFTING x 4 (LIMA-LAD, SVG-Int 1- Int 2, SVG-PD) ENDOSCOPIC GREATER SAPHENOUS VEIN HARVEST LEFT LEG (N/A) TRANSESOPHAGEAL ECHOCARDIOGRAM (TEE) (N/A) Mobilize Diuresis Diabetes control d/c tubes/lines Continue foley  due to strict I&O and urinary output monitoring See progression orders Expected Acute  Blood - loss Anemia    [name] 01/25/2015 7:44 AM

## 2015-01-25 NOTE — Progress Notes (Signed)
Dr. Servando Snare notified of continued elevated BP with 12.'5mg'$  po Lopressor given early.  O2 saturation also 88% after increasing O2 to 50% Venturi mask.  Orders received for Lopressor additional 12.'5mg'$  po to be given and to increase dose to '25mg'$  BID.  Lisinopril '5mg'$  po now and daily as well as Furosemide '40mg'$  IV Now and to begin xopenex .63% nebulizers every 6 hours.

## 2015-01-26 ENCOUNTER — Inpatient Hospital Stay (HOSPITAL_COMMUNITY): Payer: PPO

## 2015-01-26 ENCOUNTER — Ambulatory Visit: Payer: PPO | Admitting: Cardiovascular Disease

## 2015-01-26 LAB — MAGNESIUM: Magnesium: 2 mg/dL (ref 1.7–2.4)

## 2015-01-26 LAB — HEPATIC FUNCTION PANEL
ALT: 16 U/L — ABNORMAL LOW (ref 17–63)
AST: 30 U/L (ref 15–41)
Albumin: 3.1 g/dL — ABNORMAL LOW (ref 3.5–5.0)
Alkaline Phosphatase: 46 U/L (ref 38–126)
Bilirubin, Direct: 0.2 mg/dL (ref 0.1–0.5)
Indirect Bilirubin: 0.6 mg/dL (ref 0.3–0.9)
Total Bilirubin: 0.8 mg/dL (ref 0.3–1.2)
Total Protein: 5.7 g/dL — ABNORMAL LOW (ref 6.5–8.1)

## 2015-01-26 LAB — BASIC METABOLIC PANEL
Anion gap: 7 (ref 5–15)
BUN: 14 mg/dL (ref 6–20)
CO2: 27 mmol/L (ref 22–32)
Calcium: 8.6 mg/dL — ABNORMAL LOW (ref 8.9–10.3)
Chloride: 96 mmol/L — ABNORMAL LOW (ref 101–111)
Creatinine, Ser: 1.21 mg/dL (ref 0.61–1.24)
GFR calc Af Amer: >60 mL/min (ref >60)
GFR calc non Af Amer: 58 mL/min — ABNORMAL LOW (ref >60)
Glucose, Bld: 158 mg/dL — ABNORMAL HIGH (ref 65–99)
Potassium: 4.6 mmol/L (ref 3.5–5.1)
Sodium: 130 mmol/L — ABNORMAL LOW (ref 135–145)

## 2015-01-26 LAB — CBC
HCT: 38.2 % — ABNORMAL LOW (ref 39.0–52.0)
Hemoglobin: 12.8 g/dL — ABNORMAL LOW (ref 13.0–17.0)
MCH: 30.2 pg (ref 26.0–34.0)
MCHC: 33.5 g/dL (ref 30.0–36.0)
MCV: 90.1 fL (ref 78.0–100.0)
Platelets: 166 10*3/uL (ref 150–400)
RBC: 4.24 MIL/uL (ref 4.22–5.81)
RDW: 13.2 % (ref 11.5–15.5)
WBC: 25.1 10*3/uL — ABNORMAL HIGH (ref 4.0–10.5)

## 2015-01-26 LAB — GLUCOSE, CAPILLARY
Glucose-Capillary: 148 mg/dL — ABNORMAL HIGH (ref 65–99)
Glucose-Capillary: 134 mg/dL — ABNORMAL HIGH (ref 65–99)
Glucose-Capillary: 130 mg/dL — ABNORMAL HIGH (ref 65–99)
Glucose-Capillary: 125 mg/dL — ABNORMAL HIGH (ref 65–99)
Glucose-Capillary: 174 mg/dL — ABNORMAL HIGH (ref 65–99)

## 2015-01-26 LAB — TSH: TSH: 4.095 u[IU]/mL (ref 0.350–4.500)

## 2015-01-26 LAB — DIGOXIN LEVEL: Digoxin Level: <0.2 ng/mL — ABNORMAL LOW (ref 0.8–2.0)

## 2015-01-26 MED ORDER — AMIODARONE LOAD VIA INFUSION
150.0000 mg | Freq: Once | INTRAVENOUS | Status: AC
  Administered 2015-01-26: 150 mg via INTRAVENOUS

## 2015-01-26 MED ORDER — AMIODARONE HCL IN DEXTROSE 360-4.14 MG/200ML-% IV SOLN
30.0000 mg/h | INTRAVENOUS | Status: DC
  Administered 2015-01-27 – 2015-01-31 (×8): 30 mg/h via INTRAVENOUS
  Filled 2015-01-26 (×21): qty 200

## 2015-01-26 MED ORDER — AMIODARONE LOAD VIA INFUSION
150.0000 mg | Freq: Once | INTRAVENOUS | Status: AC
  Administered 2015-01-26: 150 mg via INTRAVENOUS
  Filled 2015-01-26: qty 83.34

## 2015-01-26 MED ORDER — METOCLOPRAMIDE HCL 5 MG/ML IJ SOLN
10.0000 mg | Freq: Four times a day (QID) | INTRAMUSCULAR | Status: AC
  Administered 2015-01-26 – 2015-01-29 (×10): 10 mg via INTRAVENOUS
  Filled 2015-01-26 (×11): qty 2

## 2015-01-26 MED ORDER — OXYCODONE HCL 5 MG PO TABS: 5.0000 mg | ORAL_TABLET | ORAL | Status: DC | PRN

## 2015-01-26 MED ORDER — FUROSEMIDE 10 MG/ML IJ SOLN
20.0000 mg | Freq: Once | INTRAMUSCULAR | Status: AC
  Administered 2015-01-26: 20 mg via INTRAVENOUS
  Filled 2015-01-26: qty 2

## 2015-01-26 MED ORDER — AMIODARONE HCL IN DEXTROSE 360-4.14 MG/200ML-% IV SOLN
INTRAVENOUS | Status: AC
  Filled 2015-01-26: qty 200

## 2015-01-26 MED ORDER — AMIODARONE HCL 200 MG PO TABS
400.0000 mg | ORAL_TABLET | Freq: Two times a day (BID) | ORAL | Status: DC
  Filled 2015-01-26: qty 2

## 2015-01-26 MED ORDER — FOLIC ACID 1 MG PO TABS
1.0000 mg | ORAL_TABLET | Freq: Every day | ORAL | Status: DC
  Administered 2015-01-26: 1 mg via ORAL
  Filled 2015-01-26 (×2): qty 1

## 2015-01-26 MED ORDER — LORAZEPAM 0.5 MG PO TABS
0.5000 mg | ORAL_TABLET | Freq: Every day | ORAL | Status: DC
  Administered 2015-01-26: 0.5 mg via ORAL
  Filled 2015-01-26: qty 1

## 2015-01-26 MED ORDER — FUROSEMIDE 10 MG/ML IJ SOLN
40.0000 mg | Freq: Once | INTRAMUSCULAR | Status: AC
  Administered 2015-01-26: 40 mg via INTRAVENOUS
  Filled 2015-01-26: qty 4

## 2015-01-26 MED ORDER — AMIODARONE HCL IN DEXTROSE 360-4.14 MG/200ML-% IV SOLN
60.0000 mg/h | INTRAVENOUS | Status: DC
  Administered 2015-01-26 (×2): 60 mg/h via INTRAVENOUS

## 2015-01-26 MED ORDER — LORAZEPAM 2 MG/ML IJ SOLN
1.0000 mg | INTRAMUSCULAR | Status: DC | PRN
  Administered 2015-01-26: 1 mg via INTRAVENOUS
  Administered 2015-01-27: 2 mg via INTRAVENOUS
  Filled 2015-01-26 (×3): qty 1

## 2015-01-26 MED ORDER — AMIODARONE HCL 200 MG PO TABS: 400.0000 mg | ORAL_TABLET | Freq: Every day | ORAL | Status: DC

## 2015-01-26 MED ORDER — ADULT MULTIVITAMIN W/MINERALS CH
1.0000 | ORAL_TABLET | Freq: Every day | ORAL | Status: DC
  Administered 2015-01-26: 1 via ORAL
  Filled 2015-01-26 (×2): qty 1

## 2015-01-26 MED ORDER — THIAMINE HCL 100 MG/ML IJ SOLN
100.0000 mg | Freq: Every day | INTRAMUSCULAR | Status: DC
  Administered 2015-01-26 – 2015-02-06 (×12): 100 mg via INTRAVENOUS
  Filled 2015-01-26 (×12): qty 1

## 2015-01-26 MED ORDER — VITAMIN B-1 100 MG PO TABS
100.0000 mg | ORAL_TABLET | Freq: Every day | ORAL | Status: DC
  Administered 2015-01-26: 100 mg via ORAL
  Filled 2015-01-26 (×2): qty 1

## 2015-01-26 NOTE — Progress Notes (Signed)
Patient ID: Randall Ali., male   DOB: January 25, 1941, 74 y.o.   MRN: [id number] [facility] DAILY ICU PROGRESS NOTE                   [address]            [place], [address]          [phone]   2 Days Post-Op Procedure(s) (LRB): CORONARY ARTERY BYPASS GRAFTING x 4 (LIMA-LAD, SVG-Int 1- Int 2, SVG-PD) ENDOSCOPIC GREATER SAPHENOUS VEIN HARVEST LEFT LEG (N/A) TRANSESOPHAGEAL ECHOCARDIOGRAM (TEE) (N/A)  Total Length of Stay:  LOS: 2 days   Subjective: Awake and alert, picking at things , knows he is at [place] says he does not drink much  Objective: Vital signs in last 24 hours: Temp:  [97 F (36.1 C)-98.6 F (37 C)] 98.4 F (36.9 C) (08/19 0358) Pulse Rate:  [86-113] 109 (08/19 0700) Cardiac Rhythm:  [-] Normal sinus rhythm (08/18 2000) Resp:  [14-32] 19 (08/19 0700) BP: (116-182)/(57-154) 125/74 mmHg (08/19 0700) SpO2:  [88 %-97 %] 96 % (08/19 0700) Arterial Line BP: (85-188)/(65-87) 105/79 mmHg (08/18 1947) FiO2 (%):  [50 %-55 %] 55 % (08/18 2135) Weight:  [216 lb 7.9 oz (98.2 kg)] 216 lb 7.9 oz (98.2 kg) (08/19 0700)  Filed Weights   01/24/15 0705 01/25/15 0421 01/26/15 0700  Weight: 210 lb (95.255 kg) 218 lb 12.8 oz (99.247 kg) 216 lb 7.9 oz (98.2 kg)    Weight change: 6 lb 7.9 oz (2.945 kg)   Hemodynamic parameters for last 24 hours: PAP: (35)/(18) 35/18 mmHg CO:  [6.7 L/min] 6.7 L/min CI:  [3.2 L/min/m2] 3.2 L/min/m2  Intake/Output from previous day: 08/18 0701 - 08/19 0700 In: 9563 [P.O.:600; I.V.:559; IV Piggyback:100] Out: 2285 [Urine:2025; Chest Tube:260]  Intake/Output this shift:    Current Meds: Scheduled Meds: . acetaminophen  1,000 mg Oral 4 times per day   Or  . acetaminophen (TYLENOL) oral liquid 160 mg/5 mL  1,000 mg Per Tube 4 times per day  . antiseptic oral rinse  7 mL Mouth Rinse BID  . aspirin EC  325 mg Oral Daily   Or  . aspirin  324 mg Per Tube Daily  . bisacodyl  10 mg Oral Daily   Or  . bisacodyl  10 mg  Rectal Daily  . cefUROXime (ZINACEF)  IV  1.5 g Intravenous Q12H  . Chlorhexidine Gluconate Cloth  6 each Topical Q0600  . docusate sodium  200 mg Oral Daily  . enoxaparin (LOVENOX) injection  30 mg Subcutaneous Q24H  . insulin aspart  0-24 Units Subcutaneous 6 times per day  . levalbuterol  0.63 mg Nebulization Q6H  . lisinopril  5 mg Oral Daily  . metoprolol tartrate  25 mg Oral BID   Or  . metoprolol tartrate  12.5 mg Per Tube BID  . mupirocin ointment   Nasal BID  . pantoprazole  40 mg Oral Daily  . simvastatin  20 mg Oral QHS  . sodium chloride  3 mL Intravenous Q12H   Continuous Infusions: . sodium chloride Stopped (01/25/15 0900)  . sodium chloride    . sodium chloride    . dexmedetomidine Stopped (01/24/15 1710)  . lactated ringers 20 mL/hr at 01/26/15 0700  . lactated ringers    . nitroGLYCERIN Stopped (01/25/15 1300)  . phenylephrine (NEO-SYNEPHRINE) Adult infusion Stopped (01/25/15 0500)   PRN Meds:.sodium chloride, lactated ringers, metoprolol, midazolam, morphine injection, ondansetron (ZOFRAN) IV, oxyCODONE, sodium  chloride, traMADol  General appearance: alert, appears stated age and mild distress Neurologic: intact Heart: regular rate and rhythm, S1, S2 normal, no murmur, click, rub or gallop Lungs: diminished breath sounds bibasilar Abdomen: soft, non-tender; bowel sounds normal; no masses,  no organomegaly Extremities: extremities normal, atraumatic, no cyanosis or edema and Homans sign is negative, no sign of DVT Wound: sternum stable  Lab Results: CBC:  Recent Labs  01/25/15 0426 01/25/15 1643 01/25/15 1650  WBC 17.3* 22.9*  --   HGB 13.4 14.0 15.0  HCT 38.7* 40.6 44.0  PLT 175 189  --    BMET:  Recent Labs  01/25/15 0426  01/25/15 1650 01/26/15 0400  NA 136  --  132* 130*  K 4.5  --  5.3* 4.6  CL 108  --  104 96*  CO2 23  --   --  27  GLUCOSE 119*  --  201* 158*  BUN 13  --  20 14  CREATININE 0.93  < > 1.10 1.21  CALCIUM 8.5*  --   --   8.6*  < > = values in this interval not displayed.  PT/INR:   Recent Labs  01/24/15 1436  LABPROT 16.7*  INR 1.34   Radiology: No results found.   Assessment/Plan: S/P Procedure(s) (LRB): CORONARY ARTERY BYPASS GRAFTING x 4 (LIMA-LAD, SVG-Int 1- Int 2, SVG-PD) ENDOSCOPIC GREATER SAPHENOUS VEIN HARVEST LEFT LEG (N/A) TRANSESOPHAGEAL ECHOCARDIOGRAM (TEE) (N/A) Sinus tachycardia, on increased o2 non rebreather  To maintain sats Started on ace for hypertension mildy agitated, picking suggestive of mild drug alcohol withdrawel D/c chest tubes  D/c foley No fever    [name] 01/26/2015 7:36 AM

## 2015-01-26 NOTE — Progress Notes (Signed)
  Amiodarone Drug - Drug Interaction Consult Note  Recommendations: none  Amiodarone is metabolized by the cytochrome P450 system and therefore has the potential to cause many drug interactions. Amiodarone has an average plasma half-life of 50 days (range 20 to 100 days).   There is potential for drug interactions to occur several weeks or months after stopping treatment and the onset of drug interactions may be slow after initiating amiodarone.   '[x]'$  Statins: Increased risk of myopathy. Simvastatin- restrict dose to '20mg'$  daily. On simva 20 mg daily Other statins: counsel patients to report any muscle pain or weakness immediately.  '[]'$  Anticoagulants: Amiodarone can increase anticoagulant effect. Consider warfarin dose reduction. Patients should be monitored closely and the dose of anticoagulant altered accordingly, remembering that amiodarone levels take several weeks to stabilize.  '[]'$  Antiepileptics: Amiodarone can increase plasma concentration of phenytoin, the dose should be reduced. Note that small changes in phenytoin dose can result in large changes in levels. Monitor patient and counsel on signs of toxicity.  '[]'$  Beta blockers: increased risk of bradycardia, AV block and myocardial depression. Sotalol - avoid concomitant use.  '[]'$   Calcium channel blockers (diltiazem and verapamil): increased risk of bradycardia, AV block and myocardial depression.  '[]'$   Cyclosporine: Amiodarone increases levels of cyclosporine. Reduced dose of cyclosporine is recommended.  '[]'$  Digoxin dose should be halved when amiodarone is started.  '[]'$  Diuretics: increased risk of cardiotoxicity if hypokalemia occurs.  '[]'$  Oral hypoglycemic agents (glyburide, glipizide, glimepiride): increased risk of hypoglycemia. Patient's glucose levels should be monitored closely when initiating amiodarone therapy.   '[]'$  Drugs that prolong the QT interval:  Torsades de pointes risk may be increased with concurrent use - avoid if  possible.  Monitor QTc, also keep magnesium/potassium WNL if concurrent therapy can't be avoided. [name] Antibiotics: e.g. fluoroquinolones, erythromycin. . Antiarrhythmics: e.g. quinidine, procainamide, disopyramide, sotalol. . Antipsychotics: e.g. phenothiazines, haloperidol.  . Lithium, tricyclic antidepressants, and methadone. Thank You,  Leodis Sias T  01/26/2015 3:03 PM

## 2015-01-26 NOTE — Progress Notes (Signed)
Patient found sitting on side of bed with oxygen off.  Trying to get up.  States he was hot.  Patient reoriented to surroundings.  Oxygen placed back on patient.  O2 sats 96%.  Dressing changed to Introducer due to patient pulling off dressing.  Chest tube and foley both intact.  Patient instructed to the importance of calling nurse if needing something, and to not try and get up without assistance.  Bed alarm placed on.  Patient made aware of bed alarm being turned on at this time.

## 2015-01-26 NOTE — Progress Notes (Signed)
Patient went into Afib RVR rate 140s-150s; MD notified in OR; orders placed per MD verbal order for Amio order set, labs; will continue to monitor and assess; rate currently in 110s, BP 128/62; family at bedside.  Rowe Pavy, RN

## 2015-01-26 NOTE — Progress Notes (Signed)
CT surgery p.m. Rounds  Patient currently on 80% facemask to maintain saturations We'll repeat Lasix dose this evening Patient slightly confused but pleasant, no focal  neuro deficit Atrial fibrillation with better rate control after second bolus of amiodarone

## 2015-01-26 NOTE — Progress Notes (Signed)
Left pleural tube DC'd per MD orders and protocol; patient tolerated well; patient in bed with call bell within reach; friend visiting; will continue to monitor.  Rowe Pavy, RN

## 2015-01-27 ENCOUNTER — Inpatient Hospital Stay (HOSPITAL_COMMUNITY): Payer: PPO

## 2015-01-27 LAB — POCT I-STAT 3, ART BLOOD GAS (G3+)
Acid-Base Excess: 1 mmol/L (ref 0.0–2.0)
Acid-Base Excess: 3 mmol/L — ABNORMAL HIGH (ref 0.0–2.0)
Bicarbonate: 24.8 mEq/L — ABNORMAL HIGH (ref 20.0–24.0)
Bicarbonate: 25.7 mEq/L — ABNORMAL HIGH (ref 20.0–24.0)
O2 Saturation: 91 %
O2 Saturation: 98 %
Patient temperature: 98.6
Patient temperature: 98.9
TCO2: 26 mmol/L (ref 0–100)
TCO2: 27 mmol/L (ref 0–100)
pCO2 arterial: 36.9 mmHg (ref 35.0–45.0)
pCO2 arterial: 33.9 mmHg — ABNORMAL LOW (ref 35.0–45.0)
pH, Arterial: 7.434 (ref 7.350–7.450)
pH, Arterial: 7.488 — ABNORMAL HIGH (ref 7.350–7.450)
pO2, Arterial: 59 mmHg — ABNORMAL LOW (ref 80.0–100.0)
pO2, Arterial: 96 mmHg (ref 80.0–100.0)

## 2015-01-27 LAB — CBC
HCT: 32.1 % — ABNORMAL LOW (ref 39.0–52.0)
Hemoglobin: 10.8 g/dL — ABNORMAL LOW (ref 13.0–17.0)
MCH: 29.9 pg (ref 26.0–34.0)
MCHC: 33.6 g/dL (ref 30.0–36.0)
MCV: 88.9 fL (ref 78.0–100.0)
Platelets: 136 10*3/uL — ABNORMAL LOW (ref 150–400)
RBC: 3.61 MIL/uL — ABNORMAL LOW (ref 4.22–5.81)
RDW: 13.3 % (ref 11.5–15.5)
WBC: 16.5 10*3/uL — ABNORMAL HIGH (ref 4.0–10.5)

## 2015-01-27 LAB — POCT I-STAT, CHEM 8
BUN: 26 mg/dL — ABNORMAL HIGH (ref 6–20)
Calcium, Ion: 1.19 mmol/L (ref 1.13–1.30)
Chloride: 96 mmol/L — ABNORMAL LOW (ref 101–111)
Creatinine, Ser: 1.3 mg/dL — ABNORMAL HIGH (ref 0.61–1.24)
Glucose, Bld: 130 mg/dL — ABNORMAL HIGH (ref 65–99)
HCT: 34 % — ABNORMAL LOW (ref 39.0–52.0)
Hemoglobin: 11.6 g/dL — ABNORMAL LOW (ref 13.0–17.0)
Potassium: 4.1 mmol/L (ref 3.5–5.1)
Sodium: 132 mmol/L — ABNORMAL LOW (ref 135–145)
TCO2: 23 mmol/L (ref 0–100)

## 2015-01-27 LAB — BASIC METABOLIC PANEL
Anion gap: 8 (ref 5–15)
BUN: 28 mg/dL — ABNORMAL HIGH (ref 6–20)
CO2: 27 mmol/L (ref 22–32)
Calcium: 8.3 mg/dL — ABNORMAL LOW (ref 8.9–10.3)
Chloride: 96 mmol/L — ABNORMAL LOW (ref 101–111)
Creatinine, Ser: 1.75 mg/dL — ABNORMAL HIGH (ref 0.61–1.24)
GFR calc Af Amer: 43 mL/min — ABNORMAL LOW (ref >60)
GFR calc non Af Amer: 37 mL/min — ABNORMAL LOW (ref >60)
Glucose, Bld: 155 mg/dL — ABNORMAL HIGH (ref 65–99)
Potassium: 4 mmol/L (ref 3.5–5.1)
Sodium: 131 mmol/L — ABNORMAL LOW (ref 135–145)

## 2015-01-27 LAB — GLUCOSE, CAPILLARY
Glucose-Capillary: 115 mg/dL — ABNORMAL HIGH (ref 65–99)
Glucose-Capillary: 119 mg/dL — ABNORMAL HIGH (ref 65–99)
Glucose-Capillary: 122 mg/dL — ABNORMAL HIGH (ref 65–99)
Glucose-Capillary: 128 mg/dL — ABNORMAL HIGH (ref 65–99)
Glucose-Capillary: 130 mg/dL — ABNORMAL HIGH (ref 65–99)
Glucose-Capillary: 134 mg/dL — ABNORMAL HIGH (ref 65–99)

## 2015-01-27 LAB — MAGNESIUM: Magnesium: 2.1 mg/dL (ref 1.7–2.4)

## 2015-01-27 MED ORDER — CETYLPYRIDINIUM CHLORIDE 0.05 % MT LIQD
7.0000 mL | Freq: Two times a day (BID) | OROMUCOSAL | Status: DC
  Administered 2015-01-27 – 2015-01-28 (×3): 7 mL via OROMUCOSAL

## 2015-01-27 MED ORDER — SODIUM CHLORIDE 0.9 % IJ SOLN
10.0000 mL | Freq: Two times a day (BID) | INTRAMUSCULAR | Status: DC
  Administered 2015-01-27 – 2015-02-01 (×10): 10 mL
  Administered 2015-02-01: 30 mL
  Administered 2015-02-02: 20 mL
  Administered 2015-02-02 – 2015-02-03 (×3): 10 mL
  Administered 2015-02-04 (×2): 30 mL
  Administered 2015-02-05 (×2): 10 mL
  Administered 2015-02-06: 30 mL
  Administered 2015-02-06 – 2015-02-09 (×2): 10 mL

## 2015-01-27 MED ORDER — FUROSEMIDE 10 MG/ML IJ SOLN
40.0000 mg | Freq: Once | INTRAMUSCULAR | Status: AC
  Administered 2015-01-27: 40 mg via INTRAVENOUS
  Filled 2015-01-27: qty 4

## 2015-01-27 MED ORDER — SODIUM CHLORIDE 0.9 % IV SOLN: INTRAVENOUS | Status: DC | PRN

## 2015-01-27 MED ORDER — HALOPERIDOL LACTATE 5 MG/ML IJ SOLN
2.0000 mg | INTRAMUSCULAR | Status: DC | PRN
  Administered 2015-01-27 – 2015-01-28 (×8): 4 mg via INTRAVENOUS
  Filled 2015-01-27 (×8): qty 1

## 2015-01-27 MED ORDER — FUROSEMIDE 10 MG/ML IJ SOLN
40.0000 mg | Freq: Two times a day (BID) | INTRAMUSCULAR | Status: DC
Start: 2015-01-27 — End: 2015-01-28
  Administered 2015-01-27 – 2015-01-28 (×2): 40 mg via INTRAVENOUS
  Filled 2015-01-27 (×4): qty 4

## 2015-01-27 MED ORDER — SODIUM CHLORIDE 0.45 % IV SOLN: INTRAVENOUS | Status: DC | PRN

## 2015-01-27 MED ORDER — LORAZEPAM 2 MG/ML IJ SOLN
1.0000 mg | INTRAMUSCULAR | Status: DC | PRN
  Administered 2015-01-27 (×2): 2 mg via INTRAVENOUS
  Filled 2015-01-27 (×2): qty 1

## 2015-01-27 MED ORDER — SODIUM CHLORIDE 0.9 % IJ SOLN
10.0000 mL | INTRAMUSCULAR | Status: DC | PRN
  Administered 2015-01-27: 10 mL
  Filled 2015-01-27: qty 40

## 2015-01-27 MED ORDER — LORAZEPAM 2 MG/ML IJ SOLN
1.0000 mg | INTRAMUSCULAR | Status: DC | PRN
  Administered 2015-01-27 – 2015-01-28 (×6): 1 mg via INTRAVENOUS
  Filled 2015-01-27 (×6): qty 1

## 2015-01-27 MED ORDER — CHLORHEXIDINE GLUCONATE 0.12 % MT SOLN
15.0000 mL | Freq: Two times a day (BID) | OROMUCOSAL | Status: DC
  Administered 2015-01-28 – 2015-02-05 (×18): 15 mL via OROMUCOSAL
  Filled 2015-01-27 (×8): qty 15

## 2015-01-27 MED ORDER — PANTOPRAZOLE SODIUM 40 MG IV SOLR
40.0000 mg | Freq: Every day | INTRAVENOUS | Status: DC
  Administered 2015-01-27 – 2015-01-29 (×3): 40 mg via INTRAVENOUS
  Filled 2015-01-27 (×4): qty 40

## 2015-01-27 NOTE — Progress Notes (Signed)
3 Days Post-Op Procedure(s) (LRB): CORONARY ARTERY BYPASS GRAFTING x 4 (LIMA-LAD, SVG-Int 1- Int 2, SVG-PD) ENDOSCOPIC GREATER SAPHENOUS VEIN HARVEST LEFT LEG (N/A) TRANSESOPHAGEAL ECHOCARDIOGRAM (TEE) (N/A) Subjective: Severe delerium- now restrained on BIPAP afib CXR wet- getting lasix  Objective: Vital signs in last 24 hours: Temp:  [97.7 F (36.5 C)-99.2 F (37.3 C)] 98.9 F (37.2 C) (08/20 0900) Pulse Rate:  [25-144] 105 (08/20 0934) Cardiac Rhythm:  [-] Atrial fibrillation (08/20 0836) Resp:  [17-31] 31 (08/20 0934) BP: (90-148)/(42-73) 148/59 mmHg (08/20 0934) SpO2:  [90 %-100 %] 98 % (08/20 0934) FiO2 (%):  [100 %] 100 % (08/20 0934) Weight:  [219 lb 4.8 oz (99.474 kg)] 219 lb 4.8 oz (99.474 kg) (08/20 0500)  Hemodynamic parameters for last 24 hours:  afebrile BP stable  Intake/Output from previous day: 08/19 0701 - 08/20 0700 In: 1905.9 [P.O.:885; I.V.:920.9; IV Piggyback:100] Out: 840 [Urine:790; Chest Tube:50] Intake/Output this shift: Total I/O In: -  Out: 125 [Urine:125]  Sedated Distant BS abd soft Lab Results:  Recent Labs  01/26/15 0400 01/27/15 0359  WBC 25.1* 16.5*  HGB 12.8* 10.8*  HCT 38.2* 32.1*  PLT 166 136*   BMET:  Recent Labs  01/26/15 0400 01/27/15 0359  NA 130* 131*  K 4.6 4.0  CL 96* 96*  CO2 27 27  GLUCOSE 158* 155*  BUN 14 28*  CREATININE 1.21 1.75*  CALCIUM 8.6* 8.3*    PT/INR:  Recent Labs  01/24/15 1436  LABPROT 16.7*  INR 1.34   ABG    Component Value Date/Time   PHART 7.434 01/27/2015 0910   HCO3 24.8* 01/27/2015 0910   TCO2 26 01/27/2015 0910   ACIDBASEDEF 4.0* 01/24/2015 2031   O2SAT 91.0 01/27/2015 0910   CBG (last 3)   Recent Labs  01/27/15 0014 01/27/15 0417 01/27/15 0920  GLUCAP 130* 119* 128*    Assessment/Plan: S/P Procedure(s) (LRB): CORONARY ARTERY BYPASS GRAFTING x 4 (LIMA-LAD, SVG-Int 1- Int 2, SVG-PD) ENDOSCOPIC GREATER SAPHENOUS VEIN HARVEST LEFT LEG (N/A) TRANSESOPHAGEAL  ECHOCARDIOGRAM (TEE) (N/A) Diuresis Continue foley due to acute urinary retention cont prn ativan, haldol for delerium  Situation d/w sister   LOS: 3 days    Tharon Aquas Trigt III 01/27/2015

## 2015-01-27 NOTE — Progress Notes (Signed)
Peripherally Inserted Central Catheter/Midline Placement  The IV Nurse has discussed with the patient and/or persons authorized to consent for the patient, the purpose of this procedure and the potential benefits and risks involved with this procedure.  The benefits include less needle sticks, lab draws from the catheter and patient may be discharged home with the catheter.  Risks include, but not limited to, infection, bleeding, blood clot (thrombus formation), and puncture of an artery; nerve damage and irregular heat beat.  Alternatives to this procedure were also discussed.  Consent signed by daughter.  PICC/Midline Placement Documentation  PICC Triple Lumen 01/27/15 PICC Right Cephalic 38 cm 0 cm (Active)  Indication for Insertion or Continuance of Line Vasoactive infusions 01/27/2015  1:05 PM  Exposed Catheter (cm) 0 cm 01/27/2015  1:05 PM  Site Assessment Clean;Dry;Intact 01/27/2015  1:05 PM  Lumen #1 Status Flushed;Saline locked;Blood return noted 01/27/2015  1:05 PM  Lumen #2 Status Flushed;Saline locked;Blood return noted 01/27/2015  1:05 PM  Lumen #3 Status Flushed;Saline locked;Blood return noted 01/27/2015  1:05 PM  Dressing Type Transparent 01/27/2015  1:05 PM  Dressing Change Due 02/03/15 01/27/2015  1:05 PM       Gordan Payment 01/27/2015, 1:06 PM

## 2015-01-27 NOTE — Progress Notes (Signed)
Patient extremely delirious, unable to control, combative. Per Dr Nils Pyle Restrain for safety, Place foley, Give Haldol 2-'4mg'$  PRN for agitation, give ativan 1-'2mg'$  every 3 hours as needed for agitation.

## 2015-01-27 NOTE — Progress Notes (Signed)
Rt. Arm PICC readjustment.  Power flushed all ports with Pt. Sitting forward to attempt to reposition PICC tip.  Repeat PCXR.

## 2015-01-27 NOTE — Progress Notes (Signed)
Patient delirious, hallucinating, attempting to get out of bed. Hard to redirect. '2mg'$  ativan given. BP stable 122/59, Afib 85. Condom cathe placed for comfort

## 2015-01-27 NOTE — Progress Notes (Signed)
CT surgery p.m. Rounds  Urgent multivessel CABG Postop atrial fibrillation-currently back in sinus rhythm Postop delirium/agitation requiring restraints and Haldol, now improved Patient required BiPAP for oxygen saturations earlier today. With significant diuresis saturations are improved and we will give him a 1-2 hour break off BiPAP and then resume overnight. Blood pressure stable Physical therapy is been consulted

## 2015-01-27 NOTE — Progress Notes (Signed)
Patient was on NRB and was ready to switch back to BIPAP per RN. RT placed patient on BIPAP. RT will continue to monitor.

## 2015-01-27 NOTE — Progress Notes (Signed)
Patient placed on Bipap per MD order.  Currently tolerating well.  Will continue to monitor.

## 2015-01-27 NOTE — Procedures (Signed)
Arterial Catheter Insertion Procedure Note Randall Ali [id number] [birth year]  Procedure: Insertion of Arterial Catheter  Indications: Blood pressure monitoring and Frequent blood sampling  Procedure Details Consent: Unable to obtain consent because of altered level of consciousness. Time Out: Verified patient identification, verified procedure, site/side was marked, verified correct patient position, special equipment/implants available, medications/allergies/relevent history reviewed, required imaging and test results available.  Performed  Maximum sterile technique was used including antiseptics, cap, gloves, gown, hand hygiene, mask and sheet. Skin prep: Chlorhexidine; local anesthetic administered 20 gauge catheter was inserted into left radial artery using the Seldinger technique.  Evaluation Blood flow good; BP tracing good. Complications: No apparent complications.   Randall Ali 01/27/2015

## 2015-01-28 ENCOUNTER — Inpatient Hospital Stay (HOSPITAL_COMMUNITY): Payer: PPO

## 2015-01-28 LAB — POCT I-STAT 3, ART BLOOD GAS (G3+)
Acid-Base Excess: 5 mmol/L — ABNORMAL HIGH (ref 0.0–2.0)
Acid-Base Excess: 3 mmol/L — ABNORMAL HIGH (ref 0.0–2.0)
Bicarbonate: 27.8 mEq/L — ABNORMAL HIGH (ref 20.0–24.0)
Bicarbonate: 26.6 mEq/L — ABNORMAL HIGH (ref 20.0–24.0)
O2 Saturation: 96 %
O2 Saturation: 92 %
Patient temperature: 98.6
TCO2: 29 mmol/L (ref 0–100)
TCO2: 28 mmol/L (ref 0–100)
pCO2 arterial: 32.9 mmHg — ABNORMAL LOW (ref 35.0–45.0)
pCO2 arterial: 36.4 mmHg (ref 35.0–45.0)
pH, Arterial: 7.534 — ABNORMAL HIGH (ref 7.350–7.450)
pH, Arterial: 7.473 — ABNORMAL HIGH (ref 7.350–7.450)
pO2, Arterial: 70 mmHg — ABNORMAL LOW (ref 80.0–100.0)
pO2, Arterial: 58 mmHg — ABNORMAL LOW (ref 80.0–100.0)

## 2015-01-28 LAB — GLUCOSE, CAPILLARY
Glucose-Capillary: 113 mg/dL — ABNORMAL HIGH (ref 65–99)
Glucose-Capillary: 117 mg/dL — ABNORMAL HIGH (ref 65–99)
Glucose-Capillary: 121 mg/dL — ABNORMAL HIGH (ref 65–99)
Glucose-Capillary: 123 mg/dL — ABNORMAL HIGH (ref 65–99)
Glucose-Capillary: 138 mg/dL — ABNORMAL HIGH (ref 65–99)
Glucose-Capillary: 141 mg/dL — ABNORMAL HIGH (ref 65–99)

## 2015-01-28 LAB — BASIC METABOLIC PANEL
Anion gap: 8 (ref 5–15)
BUN: 22 mg/dL — ABNORMAL HIGH (ref 6–20)
CO2: 28 mmol/L (ref 22–32)
Calcium: 8.2 mg/dL — ABNORMAL LOW (ref 8.9–10.3)
Chloride: 99 mmol/L — ABNORMAL LOW (ref 101–111)
Creatinine, Ser: 1.18 mg/dL (ref 0.61–1.24)
GFR calc Af Amer: >60 mL/min (ref >60)
GFR calc non Af Amer: 60 mL/min — ABNORMAL LOW (ref >60)
Glucose, Bld: 145 mg/dL — ABNORMAL HIGH (ref 65–99)
Potassium: 4.1 mmol/L (ref 3.5–5.1)
Sodium: 135 mmol/L (ref 135–145)

## 2015-01-28 LAB — CBC
HCT: 31.8 % — ABNORMAL LOW (ref 39.0–52.0)
Hemoglobin: 10.8 g/dL — ABNORMAL LOW (ref 13.0–17.0)
MCH: 29.8 pg (ref 26.0–34.0)
MCHC: 34 g/dL (ref 30.0–36.0)
MCV: 87.8 fL (ref 78.0–100.0)
Platelets: 167 10*3/uL (ref 150–400)
RBC: 3.62 MIL/uL — ABNORMAL LOW (ref 4.22–5.81)
RDW: 13.4 % (ref 11.5–15.5)
WBC: 9.9 10*3/uL (ref 4.0–10.5)

## 2015-01-28 LAB — POCT I-STAT, CHEM 8
BUN: 26 mg/dL — ABNORMAL HIGH (ref 6–20)
Calcium, Ion: 1.18 mmol/L (ref 1.13–1.30)
Chloride: 99 mmol/L — ABNORMAL LOW (ref 101–111)
Creatinine, Ser: 1.2 mg/dL (ref 0.61–1.24)
Glucose, Bld: 129 mg/dL — ABNORMAL HIGH (ref 65–99)
HCT: 32 % — ABNORMAL LOW (ref 39.0–52.0)
Hemoglobin: 10.9 g/dL — ABNORMAL LOW (ref 13.0–17.0)
Potassium: 3.9 mmol/L (ref 3.5–5.1)
Sodium: 136 mmol/L (ref 135–145)
TCO2: 25 mmol/L (ref 0–100)

## 2015-01-28 MED ORDER — LORAZEPAM 2 MG/ML IJ SOLN
1.0000 mg | INTRAMUSCULAR | Status: DC | PRN
  Administered 2015-01-29 – 2015-02-02 (×10): 1 mg via INTRAVENOUS
  Filled 2015-01-28 (×10): qty 1

## 2015-01-28 MED ORDER — TRACE MINERALS CR-CU-MN-SE-ZN 10-1000-500-60 MCG/ML IV SOLN
INTRAVENOUS | Status: AC
  Administered 2015-01-28: 17:00:00 via INTRAVENOUS
  Filled 2015-01-28: qty 960

## 2015-01-28 MED ORDER — HALOPERIDOL LACTATE 5 MG/ML IJ SOLN
4.0000 mg | Freq: Once | INTRAMUSCULAR | Status: AC
  Administered 2015-01-28: 4 mg via INTRAVENOUS

## 2015-01-28 MED ORDER — FUROSEMIDE 10 MG/ML IJ SOLN
40.0000 mg | Freq: Every day | INTRAMUSCULAR | Status: DC
  Administered 2015-01-29 – 2015-02-06 (×9): 40 mg via INTRAVENOUS
  Filled 2015-01-28 (×15): qty 4

## 2015-01-28 MED ORDER — DEXMEDETOMIDINE HCL IN NACL 200 MCG/50ML IV SOLN
0.5000 ug/kg/h | INTRAVENOUS | Status: DC
  Administered 2015-01-28: 0.5 ug/kg/h via INTRAVENOUS
  Administered 2015-01-28: 0.2 ug/kg/h via INTRAVENOUS
  Administered 2015-01-28 (×3): 0.5 ug/kg/h via INTRAVENOUS
  Administered 2015-01-28: 0.6 ug/kg/h via INTRAVENOUS
  Administered 2015-01-28 – 2015-01-29 (×7): 0.5 ug/kg/h via INTRAVENOUS
  Filled 2015-01-28 (×14): qty 50

## 2015-01-28 MED ORDER — TRAVASOL 10 % IV SOLN: INTRAVENOUS | Status: DC

## 2015-01-28 MED ORDER — LORAZEPAM 2 MG/ML IJ SOLN
1.0000 mg | Freq: Once | INTRAMUSCULAR | Status: AC
  Administered 2015-01-28: 1 mg via INTRAVENOUS

## 2015-01-28 MED ORDER — HALOPERIDOL LACTATE 5 MG/ML IJ SOLN
2.0000 mg | INTRAMUSCULAR | Status: DC | PRN
  Administered 2015-01-28 – 2015-01-29 (×4): 2 mg via INTRAVENOUS
  Filled 2015-01-28 (×4): qty 1

## 2015-01-28 MED ORDER — ALBUMIN HUMAN 25 % IV SOLN
12.5000 g | Freq: Four times a day (QID) | INTRAVENOUS | Status: AC
  Administered 2015-01-28 – 2015-01-29 (×3): 12.5 g via INTRAVENOUS
  Filled 2015-01-28 (×3): qty 50

## 2015-01-28 NOTE — Progress Notes (Addendum)
PARENTERAL NUTRITION CONSULT NOTE - INITIAL  Pharmacy Consult for TPN Indication: MD - increased risk for aspiration due to BiPAP   No Known Allergies  Patient Measurements: Height: _0  (172.7 cm) Weight: 215 lb 6.2 oz (97.7 kg) IBW/kg (Calculated) : 68.4  Vital Signs: Temp: 98.7 F (37.1 C) (08/21 0000) Temp Source: Oral (08/21 0000) BP: 101/51 mmHg (08/21 0720) Pulse Rate: 91 (08/21 0720) Intake/Output from previous day: 08/20 0701 - 08/21 0700 In: 1465.3 [I.V.:1465.3] Out: 3532 [Urine:4150]  Labs:  Recent Labs  01/26/15 0400 01/27/15 0359 01/27/15 1654 01/28/15 0416  WBC 25.1* 16.5*  --  9.9  HGB 12.8* 10.8* 11.6* 10.8*  HCT 38.2* 32.1* 34.0* 31.8*  PLT 166 136*  --  167    Recent Labs  01/25/15 1643  01/26/15 0400 01/26/15 1015 01/27/15 0359 01/27/15 1654 01/28/15 0416  NA  --   < > 130*  --  131* 132* 135  K  --   < > 4.6  --  4.0 4.1 4.1  CL  --   < > 96*  --  96* 96* 99*  CO2  --   --  27  --  27  --  28  GLUCOSE  --   < > 158*  --  155* 130* 145*  BUN  --   < > 14  --  28* 26* 22*  CREATININE 1.15  < > 1.21  --  1.75* 1.30* 1.18  CALCIUM  --   --  8.6*  --  8.3*  --  8.2*  MG 2.2  --   --  2.0 2.1  --   --   PROT  --   --   --  5.7*  --   --   --   ALBUMIN  --   --   --  3.1*  --   --   --   AST  --   --   --  30  --   --   --   ALT  --   --   --  16*  --   --   --   ALKPHOS  --   --   --  46  --   --   --   BILITOT  --   --   --  0.8  --   --   --   BILIDIR  --   --   --  0.2  --   --   --   IBILI  --   --   --  0.6  --   --   --   < > = values in this interval not displayed. Estimated Creatinine Clearance: 64.1 mL/min (by C-G formula based on Cr of 1.18).   Recent Labs  01/28/15 0004 01/28/15 0404 01/28/15 0851  GLUCAP 117* 123* 138*   Medical History: Past Medical History  Diagnosis Date  . CAD (coronary artery disease)     s/p inferior wall infarct in 10/01. has stent in Rt coronary artery. is due a stress myoview. does have  some dyspnea on exertion  . SOB (shortness of breath) on exertion   . 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 Chest pain   . HLD (hyperlipidemia)     isdue followup lipids. on zocor 10 mg/day   . HTN (hypertension)   . Allergy     seasonal  . Cataract     removed  .  Reflux esophagitis   . OSA (obstructive sleep apnea) 01/13/2015  . Erectile dysfunction   . Arthritis     all over- in general   . Cancer     skin, melanoma  . GERD (gastroesophageal reflux disease)     pt. denies   Insulin Requirements in the past 24 hours:  He required 4 units of SSI in the last 24 hours (down from 12 units on 8/19)  Current Nutrition:  NPO  Nutritional Goals:  ~2000 kCal, 115 grams of protein per day  Assessment: 74 yo with CAD and s/p CABG, now with some post-op complications including delerium/DT's, Afib, and resp. compromise requiring the use of BiPAP.  He is felt to be at high risk for aspiration by his surgeon and he is to be started on TPN for nutritional support.  No pertinent PMH for GI issues except a history of GERD.    Renal:  His creatinine had trended up but appears to be back at baseline (1.18) with an estimated crcl of 36m/min.  UOP at 1.872mkg/hr CAD:  Afib - rate to 127 yesterday - back to 90's today.  IV Amiodarone on board Hepatic:  Noraml AST/ALT, Tbili and Alk. Phos on 8/19 GI: Hx. GERD - IV PPI on board.  He has functioning gut, and transitioning to TF's should be fine once risk of BiPAP issues resolved. Lytes:  WNL, no phos yet, corrected calcium for low albumin = 9.40m63ml which is within goal. Neuro:  DT's - Precdex, haldol and sedation on board - may be at risk for re-feeding syndrome  Fluids:  0.9% NS infusion at 65m74m  Access:  Picc line - 8/20 TPN start:  8/21  Plan:  - Begin Clinimix E 5/15 (contains electrolytes) at 40ml77mand titrate to goal rate to achieve full support - Will not add IVFE for 7 days - F/U electrolytes with AM labs - F/U  transition to TF's  - Continue current SSI and adjust as needed - Continue IVF at current rate - Monitor for refeeding syndrome  Amory Zbikowski Rober MinionrmD., MS Clinical Pharmacist Pager:  [phone] you for allowing pharmacy to be part of this patients care team. 01/28/2015,9:57 AM

## 2015-01-28 NOTE — Progress Notes (Signed)
Dr Prescott Gum contacted r/t increase confusion and agitation by Pt. Explained ordered anti-anxiety meds ineffective. Discussed Pt status and vitals. Order OK for bil. Soft wrist restraint and initiation of Precedex. Will continue and monitor.

## 2015-01-28 NOTE — Progress Notes (Signed)
Pt continuously throwing legs over siderails attempting to get OOB. Re-orientation attemped without success. Pt kicking at staff when attempt to get legs back in bed, bil. Soft ankle restraints a[pplied for Pt/staff safety. Will continue to monitor and assess.

## 2015-01-28 NOTE — Op Note (Signed)
NAME:  Randall Ali, GAMEL NO.:  [id number]  MEDICAL RECORD NO.:  [id number]  LOCATION:  2S04C                        FACILITY:  [facility]  PHYSICIAN:  Randall Bal, MD    DATE OF BIRTH:  1940-10-31  DATE OF PROCEDURE:  01/24/2015 DATE OF DISCHARGE:                              OPERATIVE REPORT   PREOPERATIVE DIAGNOSIS:  Coronary occlusive disease with recent onset of angina.  POSTOPERATIVE DIAGNOSIS:  Coronary occlusive disease with recent onset of angina.  SURGICAL PROCEDURE:  Coronary artery bypass grafting x4, with left internal mammary to the left anterior descending coronary artery, sequential reverse saphenous vein graft to the first and second intermediate coronary artery, reverse saphenous vein graft to the posterior descending coronary artery, with the left leg greater saphenous thigh and calf endo vein harvesting.  SURGEON:  Randall Ali, M.D.  FIRST ASSISTANT:  Suzzanne Cloud, PA  BRIEF HISTORY:  The patient is a 74 year old male, who presents from ailments with new set of onset of anginal chest pain.  He underwent cardiac catheterization and was found to have significant three-vessel coronary artery disease, including proximal LAD disease, a very small circumflex system with a relatively large intermediate branches both with stenosis and chronically occluded right coronary artery.  With overall ventricular function was preserved with the patient's three- vessel coronary artery disease and symptoms of coronary artery bypass grafting was recommended to the patient, who agreed and signed informed consent.  DESCRIPTION OF PROCEDURE:  With Swan-Ganz and arterial line monitors in place, the patient underwent general endotracheal anesthesia without incident.  The skin in the chest and legs was prepped Betadine and draped in usual sterile manner.  Using the [employer] endo vein harvesting, system vein was harvested from the left thigh and calf and was  of excellent quality and caliber.  Median sternotomy was performed.  Left internal mammary artery was dissected down as pedicle graft.  The distal artery was divided, had good free flow.  Pericardium was opened. Overall, ventricular function appeared preserved.  The patient was systemically heparinized.  Ascending aorta was cannulated.  The right atrium was cannulated.  Aortic root vent cardioplegia needle was introduced into the ascending aorta.  The patient was placed on cardiopulmonary bypass 2.4 L/min/m2.  Sites of anastomosis were selected and dissected out of the epicardium.  [name] body temperature was cooled to 32 degrees.  Aortic cross-clamp was applied and 500 mL of cold blood cardioplegia was administered with diastolic arrest of the heart. Attention was turned first to the posterior descending coronary artery. The distal right coronary artery was highly calcified as was the proximal posterior descending and in the midportion the posterior descending vessel was opened and had a reasonable sized lumen, admitting a 1 mm probe distally.  The vessel was approximately 1.3 mm in size. Using a running 7-0 Prolene, distal anastomosis was performed in a segment of reverse saphenous vein graft.  The heart was then elevated and the first intermediate and second intermediate were identified on the lateral wall of the heart.  Each was intra myocardial and the vessel each was opened and admitted a 1.5 mm probe distally.  The first intermediate was then anastomosed in a  side-to-side fashion with a segment of reverse saphenous vein graft, using a running 7-0 Prolene. Distal extent of the same vein was then carried, short distance to the second intermediate and in a likewise fashion was anastomosed with a running 7-0 Prolene.  Additional cold blood cardioplegia was administered intermittently down the vein grafts.  Attention was then turned to the left anterior descending coronary  artery, which was opened in the mid portion using a running 8-0 Prolene.  The left internal mammary artery was anastomosed to left the anterior descending coronary artery.  With release of the bulldog on the mammary artery, there was appropriate rise in myocardial septal temperature.  With the crossclamp, a bulldog was placed back on the mammary artery.  With the crossclamp still in place, 2 punch aortotomies were performed and each of the two vein grafts were anastomosed to the ascending aorta.  Air was evacuated from the grafts.  The bulldog on the mammary artery was removed and there was prompt rise in myocardial septal temperature.  Aortic crossclamp was then removed.  The patient spontaneously converted to a sinus rhythm.  Sites of anastomosis were inspected and were free of bleeding.  He was then ventilated and weaned from cardiopulmonary bypass without difficulty.  He remained hemodynamically stable.  He was decannulated in usual fashion.  Protamine sulfate was administered. Atrial and ventricular pacing wires were applied.  A TEE probe had been placed.  The beginning of the indication showed good overall LV function.  Graft marker was applied.  The left pleural tube and a Blake mediastinal drain were left in place.  Pericardium was loosely reapproximated.  Sternum was closed with #6 stainless steel wire. Fascia closed with interrupted 0 Vicryl and 3-0 Vicryl in subcutaneous tissue.  No foreign subcuticular stitch in skin edges.  Dry dressings were applied.  Sponge and needle count was reported as correct completion of procedure.  The patient tolerated the procedure without obvious complication and was transferred to the Surgical Intensive care Unit for further postoperative care.  Total pump time was 108 minutes. The patient did not require any blood bank blood products during the operative procedure.     Randall Bal, MD     EG/MEDQ  D:  01/28/2015  T:  01/28/2015   Job:  [id number]

## 2015-01-28 NOTE — Progress Notes (Signed)
PT Cancellation Note  Patient Details Name: Randall Ali. MRN: [id number] DOB: 06-03-1941   Cancelled Treatment:    Reason Eval/Treat Not Completed: Medical issues which prohibited therapy. Patient with delerium and extreme agitation - in restraints.  Will hold PT today.  Will return at later time for PT evaluation.   Despina Pole 01/28/2015, 8:11 AM Carita Pian. Sanjuana Kava, [place] Pager [phone]

## 2015-01-28 NOTE — Plan of Care (Signed)
Po@ 58 on 60% bipap, RT increased O2 up to 80%, will continue to monitor

## 2015-01-28 NOTE — Progress Notes (Signed)
4 Days Post-Op Procedure(s) (LRB): CORONARY ARTERY BYPASS GRAFTING x 4 (LIMA-LAD, SVG-Int 1- Int 2, SVG-PD) ENDOSCOPIC GREATER SAPHENOUS VEIN HARVEST LEFT LEG (N/A) TRANSESOPHAGEAL ECHOCARDIOGRAM (TEE) (N/A) Subjective: Continues with delerium /DTs afib controlled on iv amio Requires sedation and restraints Will start TNA- on BIPAP and feeding tube would be at high risk for aspiration  Objective: Vital signs in last 24 hours: Temp:  [98.7 F (37.1 C)-100.3 F (37.9 C)] 98.7 F (37.1 C) (08/21 0000) Pulse Rate:  [82-127] 91 (08/21 0720) Cardiac Rhythm:  [-] Sinus tachycardia (08/21 0000) Resp:  [18-40] 22 (08/21 0720) BP: (92-162)/(49-114) 101/51 mmHg (08/21 0720) SpO2:  [90 %-100 %] 94 % (08/21 0720) Arterial Line BP: (100-175)/(43-69) 145/65 mmHg (08/21 0700) FiO2 (%):  [50 %-100 %] 70 % (08/21 0720) Weight:  [215 lb 6.2 oz (97.7 kg)] 215 lb 6.2 oz (97.7 kg) (08/21 0500)  Hemodynamic parameters for last 24 hours:  afib, afebrile  Intake/Output from previous day: 08/20 0701 - 08/21 0700 In: 1465.3 [I.V.:1465.3] Out: 4150 [Urine:4150] Intake/Output this shift: Total I/O In: -  Out: 100 [Urine:100]  Sedated on bipap Lungs clear Lab Results:  Recent Labs  01/27/15 0359 01/27/15 1654 01/28/15 0416  WBC 16.5*  --  9.9  HGB 10.8* 11.6* 10.8*  HCT 32.1* 34.0* 31.8*  PLT 136*  --  167   BMET:  Recent Labs  01/27/15 0359 01/27/15 1654 01/28/15 0416  NA 131* 132* 135  K 4.0 4.1 4.1  CL 96* 96* 99*  CO2 27  --  28  GLUCOSE 155* 130* 145*  BUN 28* 26* 22*  CREATININE 1.75* 1.30* 1.18  CALCIUM 8.3*  --  8.2*    PT/INR: No results for input(s): LABPROT, INR in the last 72 hours. ABG    Component Value Date/Time   PHART 7.534* 01/28/2015 0116   HCO3 27.8* 01/28/2015 0116   TCO2 29 01/28/2015 0116   ACIDBASEDEF 4.0* 01/24/2015 2031   O2SAT 96.0 01/28/2015 0116   CBG (last 3)   Recent Labs  01/27/15 1939 01/28/15 0004 01/28/15 0404  GLUCAP 134* 117*  123*    Assessment/Plan: S/P Procedure(s) (LRB): CORONARY ARTERY BYPASS GRAFTING x 4 (LIMA-LAD, SVG-Int 1- Int 2, SVG-PD) ENDOSCOPIC GREATER SAPHENOUS VEIN HARVEST LEFT LEG (N/A) TRANSESOPHAGEAL ECHOCARDIOGRAM (TEE) (N/A) Aim for I = O Stat TNA   LOS: 4 days    Tharon Aquas Trigt III 01/28/2015

## 2015-01-28 NOTE — Progress Notes (Addendum)
CT surgery p.m. Rounds  Patient remains on BiPAP, restrained, and on low-dose Precedex for delirium Sinus rhythm, stable blood pressure, afebrile T NA started through PICC line Patient with less agitation today Patient's condition updated with family Continue supportive care

## 2015-01-29 ENCOUNTER — Inpatient Hospital Stay (HOSPITAL_COMMUNITY): Payer: PPO

## 2015-01-29 DIAGNOSIS — F05 Delirium due to known physiological condition: Secondary | ICD-10-CM | POA: Diagnosis not present

## 2015-01-29 DIAGNOSIS — J9601 Acute respiratory failure with hypoxia: Secondary | ICD-10-CM | POA: Diagnosis not present

## 2015-01-29 LAB — BASIC METABOLIC PANEL
Anion gap: 8 (ref 5–15)
BUN: 25 mg/dL — ABNORMAL HIGH (ref 6–20)
CO2: 29 mmol/L (ref 22–32)
Calcium: 8.5 mg/dL — ABNORMAL LOW (ref 8.9–10.3)
Chloride: 100 mmol/L — ABNORMAL LOW (ref 101–111)
Creatinine, Ser: 1 mg/dL (ref 0.61–1.24)
GFR calc Af Amer: >60 mL/min (ref >60)
GFR calc non Af Amer: >60 mL/min (ref >60)
Glucose, Bld: 151 mg/dL — ABNORMAL HIGH (ref 65–99)
Potassium: 3.6 mmol/L (ref 3.5–5.1)
Sodium: 137 mmol/L (ref 135–145)

## 2015-01-29 LAB — CBC
HCT: 30.4 % — ABNORMAL LOW (ref 39.0–52.0)
Hemoglobin: 10.3 g/dL — ABNORMAL LOW (ref 13.0–17.0)
MCH: 30 pg (ref 26.0–34.0)
MCHC: 33.9 g/dL (ref 30.0–36.0)
MCV: 88.6 fL (ref 78.0–100.0)
Platelets: 171 10*3/uL (ref 150–400)
RBC: 3.43 MIL/uL — ABNORMAL LOW (ref 4.22–5.81)
RDW: 13.6 % (ref 11.5–15.5)
WBC: 7.2 10*3/uL (ref 4.0–10.5)

## 2015-01-29 LAB — COMPREHENSIVE METABOLIC PANEL
ALT: 16 U/L — ABNORMAL LOW (ref 17–63)
AST: 16 U/L (ref 15–41)
Albumin: 2.6 g/dL — ABNORMAL LOW (ref 3.5–5.0)
Alkaline Phosphatase: 46 U/L (ref 38–126)
Anion gap: 8 (ref 5–15)
BUN: 24 mg/dL — ABNORMAL HIGH (ref 6–20)
CO2: 27 mmol/L (ref 22–32)
Calcium: 8.7 mg/dL — ABNORMAL LOW (ref 8.9–10.3)
Chloride: 103 mmol/L (ref 101–111)
Creatinine, Ser: 0.99 mg/dL (ref 0.61–1.24)
GFR calc Af Amer: >60 mL/min (ref >60)
GFR calc non Af Amer: >60 mL/min (ref >60)
Glucose, Bld: 146 mg/dL — ABNORMAL HIGH (ref 65–99)
Potassium: 4 mmol/L (ref 3.5–5.1)
Sodium: 138 mmol/L (ref 135–145)
Total Bilirubin: 0.8 mg/dL (ref 0.3–1.2)
Total Protein: 5.4 g/dL — ABNORMAL LOW (ref 6.5–8.1)

## 2015-01-29 LAB — EXPECTORATED SPUTUM ASSESSMENT W GRAM STAIN, RFLX TO RESP C: Special Requests: NORMAL

## 2015-01-29 LAB — PHOSPHORUS: Phosphorus: 2.4 mg/dL — ABNORMAL LOW (ref 2.5–4.6)

## 2015-01-29 LAB — POCT I-STAT 3, ART BLOOD GAS (G3+)
Acid-Base Excess: 5 mmol/L — ABNORMAL HIGH (ref 0.0–2.0)
Bicarbonate: 28.3 mEq/L — ABNORMAL HIGH (ref 20.0–24.0)
O2 Saturation: 94 %
Patient temperature: 97.9
TCO2: 29 mmol/L (ref 0–100)
pCO2 arterial: 34.7 mmHg — ABNORMAL LOW (ref 35.0–45.0)
pH, Arterial: 7.518 — ABNORMAL HIGH (ref 7.350–7.450)
pO2, Arterial: 63 mmHg — ABNORMAL LOW (ref 80.0–100.0)

## 2015-01-29 LAB — GLUCOSE, CAPILLARY
Glucose-Capillary: 137 mg/dL — ABNORMAL HIGH (ref 65–99)
Glucose-Capillary: 139 mg/dL — ABNORMAL HIGH (ref 65–99)
Glucose-Capillary: 146 mg/dL — ABNORMAL HIGH (ref 65–99)
Glucose-Capillary: 151 mg/dL — ABNORMAL HIGH (ref 65–99)
Glucose-Capillary: 158 mg/dL — ABNORMAL HIGH (ref 65–99)

## 2015-01-29 LAB — PREALBUMIN: Prealbumin: 7.1 mg/dL — ABNORMAL LOW (ref 18–38)

## 2015-01-29 LAB — MAGNESIUM: Magnesium: 2.3 mg/dL (ref 1.7–2.4)

## 2015-01-29 LAB — LACTIC ACID, PLASMA: Lactic Acid, Venous: 1 mmol/L (ref 0.5–2.0)

## 2015-01-29 MED ORDER — FUROSEMIDE 10 MG/ML IJ SOLN
40.0000 mg | Freq: Once | INTRAMUSCULAR | Status: AC
  Administered 2015-01-30: 40 mg via INTRAVENOUS
  Filled 2015-01-29: qty 4

## 2015-01-29 MED ORDER — CETYLPYRIDINIUM CHLORIDE 0.05 % MT LIQD
7.0000 mL | OROMUCOSAL | Status: DC
  Administered 2015-01-29 – 2015-02-05 (×41): 7 mL via OROMUCOSAL

## 2015-01-29 MED ORDER — SODIUM PHOSPHATE 3 MMOLE/ML IV SOLN
10.0000 mmol | Freq: Once | INTRAVENOUS | Status: AC
  Administered 2015-01-29: 10 mmol via INTRAVENOUS
  Filled 2015-01-29 (×2): qty 3.33

## 2015-01-29 MED ORDER — ACETAMINOPHEN 650 MG RE SUPP
650.0000 mg | Freq: Once | RECTAL | Status: AC
  Administered 2015-01-29: 650 mg via RECTAL
  Filled 2015-01-29: qty 1

## 2015-01-29 MED ORDER — POTASSIUM CHLORIDE 10 MEQ/50ML IV SOLN
10.0000 meq | INTRAVENOUS | Status: AC
  Administered 2015-01-29 (×3): 10 meq via INTRAVENOUS
  Filled 2015-01-29 (×3): qty 50

## 2015-01-29 MED ORDER — TRACE MINERALS CR-CU-MN-SE-ZN 10-1000-500-60 MCG/ML IV SOLN
INTRAVENOUS | Status: AC
  Administered 2015-01-29: 18:00:00 via INTRAVENOUS
  Filled 2015-01-29: qty 1680

## 2015-01-29 MED ORDER — FUROSEMIDE 10 MG/ML IJ SOLN
40.0000 mg | Freq: Once | INTRAMUSCULAR | Status: AC
  Administered 2015-01-29: 40 mg via INTRAVENOUS

## 2015-01-29 MED ORDER — METOPROLOL TARTRATE 1 MG/ML IV SOLN
5.0000 mg | Freq: Two times a day (BID) | INTRAVENOUS | Status: DC
  Administered 2015-01-29 – 2015-02-05 (×11): 5 mg via INTRAVENOUS
  Filled 2015-01-29 (×18): qty 5

## 2015-01-29 MED ORDER — POTASSIUM CHLORIDE 10 MEQ/50ML IV SOLN
10.0000 meq | INTRAVENOUS | Status: AC
  Administered 2015-01-29 – 2015-01-30 (×4): 10 meq via INTRAVENOUS
  Filled 2015-01-29 (×4): qty 50

## 2015-01-29 NOTE — Progress Notes (Addendum)
PT Cancellation Note  Patient Details Name: Randall Ali. MRN: [id number] DOB: 1940-10-01   Cancelled Treatment:    Reason Eval/Treat Not Completed: Medical issues which prohibited therapy. Discussed pt case with RN who states that pt was on BiPAP this morning and is now on NRB mask. Pt has been agitated this AM and RN asks that PT hold this morning and check back after lunch for readiness to participate.   Rolinda Roan 01/29/2015, 11:09 AM   Addendum: Checked back with RN at 1310 and pt now has a fever of 102. Will follow up tomorrow.   Rolinda Roan, PT, DPT Acute Rehabilitation Services Pager: [phone]

## 2015-01-29 NOTE — Progress Notes (Addendum)
MD returned call; new orders given; MD made aware of RR maintaining 30's; will cont. To monitor.  Ruben Reason

## 2015-01-29 NOTE — Consult Note (Signed)
PULMONARY / CRITICAL CARE MEDICINE   Name: Randall Ali. MRN: [id number] DOB: Nov 11, 1940    ADMISSION DATE:  01/24/2015 CONSULTATION DATE:  01/29/2015  REFERRING MD :  Dr. Servando Snare  CHIEF COMPLAINT:  Dyspnea  INITIAL PRESENTATION: 74 year old male with recent dx of 3 vessel CAD was admitted for CABG 8/17. The procedure was without complication. Hospital course has been complicated by post-op AF, delirium, and dyspnea requiring BiPAP for hypoxia. He has not been able to come off BiPAP, CXR concerning for edema. PCCM asked to evaluate.   STUDIES:  8/11 cardiac cath > Severe ostial LAD disease, moderate to severe OM1 and OM 2 as well as RCA disease. Referred for CABG Ct chest 8/16 > Stable non-calcified RUL nodule. No acute findings.  Echo 8/16 > LVEF 55-60%, Grade 1 DD, trace MR, TR   SIGNIFICANT EVENTS: 8/17 > CABG 8/20 > dyspnea requiring BiPAP 8/22 Dyspnea worsening, PCCM consult  HISTORY OF PRESENT ILLNESS:  74 year old male with PMH as below, which includes CAD, HTN, HLD, OSA, and GERD. He was recently admitted to [facility] for chest pain that was felt to be atypical at the time. Cardiac enzymes remained normal, however, his symptoms somewhat persisted. He was discharged and had cardiac cath as outpatient 8/11, which showed 3 vessel disease. He was referred to [facility] for CABG. He saw them as outpatient 8/17, presenting to office with chest pain. He was admitted from office for urgent CABG. The procedure was without complication. Post-operative course, however, has been complicated by A-fib, delirium, and dyspnea. AF has been controlled with amiodarone, he was placed on precedex for delirium, and dypsnea has thus far been treated with diuresis and BiPAP. Dyspnea reportedly worsening 8/22, and PCCM was consulted for further evaluation.    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  has past surgical history that includes Cardiac catheterization (06/24/11); Carotid stent (03/10/2011); arm surgery (2010); Shoulder arthroscopy (2012); Wrist surgery (2011); Toe Surgery (1994); Nasal sinus surgery (2008); Hammer toe surgery; Colonoscopy (2010); Cardiac catheterization (N/A, 01/18/2015); Cardiac catheterization (N/A, 01/18/2015); Eye surgery; Coronary artery bypass graft (N/A, 01/24/2015); and TEE without cardioversion (N/A, 01/24/2015). Prior to Admission medications   Medication Sig Start Date End Date Taking? Authorizing Provider  aspirin 81 MG EC tablet Take 81 mg by mouth daily.     Yes Historical Provider, MD  Calcium-Vitamin D (RA CALCIUM PLUS VITAMIN D) 600-125 MG-UNIT TABS Take 1 tablet by mouth 2 (two) times daily.    Yes Historical Provider, MD  ibuprofen (ADVIL,MOTRIN) 200 MG tablet Take 400-600 mg by mouth every 6 (six) hours as needed.   Yes Historical Provider, MD  metoprolol succinate (TOPROL-XL) 25 MG 24 hr tablet Take 1 tablet (25 mg total) by mouth daily. Patient taking differently: Take 12.5 mg by mouth daily before breakfast.  04/10/14  Yes [name], MD  nitroGLYCERIN (NITROSTAT) 0.4 MG SL tablet Place 1 tablet (0.4 mg total) under the tongue every 5 (five) minutes as needed for chest pain. 08/08/14  Yes Minna Merritts, MD  Probiotic Product (PROBIOTIC PO) Take 2 tablets by mouth daily. accuflora   Yes Historical Provider, MD  simvastatin (ZOCOR) 20 MG tablet Take 1 tablet (20 mg total) by mouth  at bedtime. 01/11/15  Yes [name], MD  tadalafil (CIALIS) 20 MG tablet Take 1 tablet (20 mg total) by mouth daily as needed for erectile dysfunction. 02/27/12  Yes [name], MD  traMADol (ULTRAM) 50 MG tablet Take 1 tablet (50  mg total) by mouth every 6 (six) hours as needed. Patient taking differently: Take 50 mg by mouth every 6 (six) hours as needed for moderate pain.  11/24/14  Yes [name], MD  vitamin B-12 (CYANOCOBALAMIN) 1000 MCG tablet Take 1,000 mcg by mouth daily.   Yes Historical Provider, MD  vitamin C (ASCORBIC ACID) 500 MG tablet Take 500 mg by mouth daily.   Yes Historical Provider, MD  Zinc 100 MG TABS Take 1 tablet by mouth daily.     Yes Historical Provider, MD  zolpidem (AMBIEN) 10 MG tablet TAKE ONE TABLET AT BEDTIME AS NEEDED FORSLEEP Patient taking differently: Take 10 mg by mouth at bedtime as needed for sleep.  04/10/14  Yes [name], MD   No Known Allergies  FAMILY HISTORY:  indicated that his mother is deceased. He indicated that his father is deceased. He indicated that his brother is alive.  SOCIAL HISTORY:  reports that he quit smoking about 14 years ago. His smoking use included Cigarettes. He has a 100 pack-year smoking history. He has never used smokeless tobacco. He reports that he drinks about 6.0 oz of alcohol per week. He reports that he does not use illicit drugs.  REVIEW OF SYSTEMS: limited by delirium Bolds are positive  Constitutional: weight loss, gain, night sweats, Fevers, chills, fatigue .  HEENT: headaches, Sore throat, sneezing, nasal congestion, post nasal drip, Difficulty swallowing, Tooth/dental problems, visual complaints visual changes, ear ache CV:  chest pain, radiates: ,Orthopnea, PND, swelling in lower extremities, dizziness, palpitations, syncope.  GI  heartburn, indigestion, abdominal pain, nausea, vomiting, diarrhea, change in bowel habits, loss of appetite, bloody stools.  Resp: cough, productive: , hemoptysis, dyspnea, chest pain, pleuritic.  Skin: rash or itching or icterus GU: dysuria, change in color of urine, urgency or frequency. flank pain, hematuria  MS: joint pain or swelling. decreased range of motion  Psych: change in mood or affect.  depression or anxiety.  Neuro: difficulty with speech, weakness, numbness, ataxia    SUBJECTIVE:   VITAL SIGNS: Temp:  [97.9 F (36.6 C)-102 F (38.9 C)] 99.7 F (37.6 C) (08/22 1953) Pulse Rate:  [35-103] 85 (08/22 1900) Resp:  [16-39] 19 (08/22 1900) BP: (104-197)/(49-95) 149/78 mmHg (08/22 1900) SpO2:  [93 %-100 %] 100 % (08/22 1900) Arterial Line BP: (113-187)/(45-65) 170/60 mmHg (08/22 0900) FiO2 (%):  [60 %-100 %] 70 % (08/22 1600) Weight:  [96.2 kg (212 lb 1.3 oz)] 96.2 kg (212 lb 1.3 oz) (08/22 0400) HEMODYNAMICS:   VENTILATOR SETTINGS: Vent Mode:  [-]  FiO2 (%):  [60 %-100 %] 70 % INTAKE / OUTPUT:  Intake/Output Summary (Last 24 hours) at 01/29/15 2005 Last data filed at 01/29/15 1900  Gross per 24 hour  Intake 2162.3 ml  Output   2845 ml  Net -682.7 ml    PHYSICAL EXAMINATION: General:  Acutely ill appearing male in mild respiratory distress Neuro:  Alert, oriented to self, place only. Responds appropriately.  HEENT:  [place]/AT, no JVD noted, PERRL Cardiovascular:  Borderline Tachy, sinus. No MRG Lungs:  Diminished R > L. Tachypneic, mild accessory muscle use (currenlty off BiPAP) Abdomen:  Soft, non-tender, non-distended Musculoskeletal:  No acute deformity of ROM limitation Skin:  Grossly intact.   LABS:  CBC  Recent Labs Lab 01/27/15 0359  01/28/15 0416 01/28/15 1522 01/29/15 0338  WBC 16.5*  --  9.9  --  7.2  HGB 10.8*  < > 10.8* 10.9* 10.3*  HCT 32.1*  < > 31.8* 32.0* 30.4*  PLT 136*  --  167  --  171  < > = values in this interval not displayed. Coag's  Recent Labs Lab 01/24/15 1436  APTT 36  INR 1.34   BMET  Recent Labs Lab 01/28/15 0416 01/28/15 1522 01/29/15 0338 01/29/15 0745  NA 135 136 137 138  K 4.1 3.9 3.6 4.0  CL 99* 99* 100* 103  CO2 28  --  29 27  BUN 22* 26* 25* 24*  CREATININE 1.18 1.20 1.00 0.99  GLUCOSE 145* 129* 151* 146*   Electrolytes  Recent Labs Lab 01/26/15 1015 01/27/15 0359 01/28/15 0416  01/29/15 0338 01/29/15 0745  CALCIUM  --  8.3* 8.2* 8.5* 8.7*  MG 2.0 2.1  --   --  2.3  PHOS  --   --   --   --  2.4*   Sepsis Markers  Recent Labs Lab 01/29/15 1330  LATICACIDVEN 1.0   ABG  Recent Labs Lab 01/28/15 0116 01/28/15 1517 01/29/15 0340  PHART 7.534* 7.473* 7.518*  PCO2ART 32.9* 36.4 34.7*  PO2ART 70.0* 58.0* 63.0*   Liver Enzymes  Recent Labs Lab 01/26/15 1015 01/29/15 0745  AST 30 16  ALT 16* 16*  ALKPHOS 46 46  BILITOT 0.8 0.8  ALBUMIN 3.1* 2.6*   Cardiac Enzymes No results for input(s): TROPONINI, PROBNP in the last 168 hours. Glucose  Recent Labs Lab 01/28/15 1950 01/28/15 2357 01/29/15 0338 01/29/15 0838 01/29/15 1714 01/29/15 1929  GLUCAP 141* 137* 139* 146* 151* 158*   I/O last 3 completed shifts: In: 3278.2 [I.V.:1735.2; IV ZHYQMVHQI:696] Out: 2952 [WUXLK:4401] Total I/O In: -  Out: 900 [Urine:900]    Imaging Dg Chest Port 1 View  01/29/2015   CLINICAL DATA:  Shortness of breath, status post CABG G on January 24, 2015  EXAM: PORTABLE CHEST - 1 VIEW  COMPARISON:  Portable chest x-ray of January 28, 2015  FINDINGS: Positioning is limited. The lungs are well-expanded. The interstitial markings have improved increased significantly. The hemidiaphragms are obscured. The cardiac silhouette is mildly enlarged. The pulmonary vascularity is indistinct. There is no pneumothorax. The right-sided PICC line tip projects over the proximal SVC.  IMPRESSION: Deterioration in the appearance of the pulmonary interstitium consistent with pulmonary edema. There are likely bilateral pleural effusions layering posteriorly.   Electronically Signed   By: David  Martinique M.D.   On: 01/29/2015 07:45     ASSESSMENT / PLAN:  PULMONARY A: Acute hypoxic respiratory failure Pulmonary Edema Low suspicion amiodarone lung tox Mod-severe OSA by sleep study 7/28  P:   PRN BiPAP to maintain SpO2 > 92% Repeat ABG in AM Pulmonary hygiene Repeat CXR in  AM Diuresed by primary team with good output, continue Titration study needed once acute episode resolved  CARDIOVASCULAR A:  3 vessel CAD s/p CABG 8/17 Post operative Afib HTN  P:  Management per [facility] On Amio for AF Scheduled and PRN metoprolol  RENAL A:   Metabolic alkalosis ?diuresis related  P:  Follow Bmet Repeat ABG Aggressive K (keep > 4), mag (keep > 2) supplementation KVO IVF  GASTROINTESTINAL A:   Nutrition  P:   TPN per primary  HEMATOLOGIC A:   VTE prophylaxis  P:  SQ lovenox  INFECTIOUS A:   No acute issues  P:   Monitor  ENDOCRINE A:   Hyperglycemia with no DM history  P:   CBG monitoring and SSI per primary  NEUROLOGIC A:   Acute metabolic encephalopathy (improving) ? ETOH withdrawal/DTs  P:   RASS goal: 0 Precedex gtt per ICU CIWA protocol PRN Ativan/Haldol Monitor QTc on tele   FAMILY  - Updates:   - Inter-disciplinary family meet or Palliative Care meeting due by:  8/24    TODAY'S SUMMARY: 74 year old male 5 days post op CABG now with dyspnea. Reportedly doing better on BiPAP. Now off with some minimal WOB. CXR appears to be pulmonary edema. He is being successfully diuresed with lasix. Continue BiPAP PRN, will continue to follow.     Georgann Housekeeper, AGACNP-BC [facility] Pager [phone] or [phone] 01/29/2015 9:06 PM   Attending Note:  I have examined patient, reviewed labs, studies and notes. I have discussed the case with Jaclynn Guarneri, and I agree with the data and plans as amended above. 74 yo man s/p CABG. Course c/b delirium, multifactorial respiratory failure with evidence for evolving cardiogenic pulmonary edema. He has required NIPPV for several days to maintain stability. Also has received ativan, haldol. On my eval he is tolerating a break from BiPAP on 4L/min [place], is able interact although not completely oriented. He is diuresing well. Will plan to follow closely, use BiPAP as  needed. He does not appear to require intubation at this time. Independent critical care time is 50 minutes.   Baltazar Apo, MD, PhD 01/29/2015, 10:40 PM [facility] [phone] or if no answer [phone]

## 2015-01-29 NOTE — Progress Notes (Signed)
Initial Nutrition Assessment  DOCUMENTATION CODES:   Obesity unspecified  INTERVENTION:    TPN per pharmacy   Consider post-pyloric feeding tube placement  NUTRITION DIAGNOSIS:   Inadequate oral intake related to inability to eat as evidenced by NPO status  GOAL:   Patient will meet greater than or equal to 90% of their needs  MONITOR:   Diet advancement, Labs, Weight trends, I & O's  REASON FOR ASSESSMENT:   Consult New TPN/TNA  ASSESSMENT:   74 y.o. Male with PMH of HTN, hyperlipidemia, CAD s/p myocardial infarction in 2001 with stent placed presented to the ED intermittent chest pain, palpitations.   Patient s/p procedure: CORONARY ARTERY BYPASS GRAFTING x 4  Pt currently on BiPAP.  TPN initiated given high aspiration risk, however, he has a functioning GI tract.  Patient is receiving TPN with Clinimix E 5/15 @ 70 ml/hr.  No IVFE at this time.  Provides 1193 kcal and 84 grams protein per day. Meets 54% minimum estimated energy needs and 65% minimum estimated protein needs.  RD unable to complete Nutrition Focused Physical Exam at this time.  Diet Order:  Diet NPO time specified .TPN (CLINIMIX-E) Adult .TPN (CLINIMIX-E) Adult  Skin:  Reviewed, no issues  Last BM:  8/17  Height:   Ht Readings from Last 1 Encounters:  01/24/15 '5\' 8"'$  (1.727 m)    Weight:   Wt Readings from Last 1 Encounters:  01/29/15 212 lb 1.3 oz (96.2 kg)    Ideal Body Weight:  70 kg  BMI:  Body mass index is 32.25 kg/(m^2).  Estimated Nutritional Needs:   Kcal:  2200-2400  Protein:  130-140 gm  Fluid:  2.2-2.4 L  EDUCATION NEEDS:   No education needs identified at this time  Arthur Holms, RD, LDN Pager #: [phone] After-Hours Pager #: [phone]

## 2015-01-29 NOTE — Progress Notes (Signed)
K+= 3.6 and creat= 1.00 w/ urine o/p > 30cc/hr; [facility] KCL protocol initiated with 10 mEq KCL in 50cc IV x 3, each over one hour.

## 2015-01-29 NOTE — Progress Notes (Signed)
Temp at this time 102; no PRN orders; MD paged at this time; will await callback.  Randall Ali Reason

## 2015-01-29 NOTE — Progress Notes (Signed)
Patient ID: Randall Ali., male   DOB: 12/28/1940, 74 y.o.   MRN: [id number]  SICU Evening Rounds:  Hemodynamically stable today but increased work of breathing. On bipap earlier and switched to NRB, now back on bipap. Sats 100.  CXR this am showed pulmonary edema. Continue diuresis and replete K+. Negative 800 cc today.  Postop delirium on Precedex. Hx of ETOH abuse.

## 2015-01-29 NOTE — Progress Notes (Addendum)
[facility] DAILY ICU PROGRESS NOTE                   [address]            [place] [address]          [phone]   5 Days Post-Op Procedure(s) (LRB): CORONARY ARTERY BYPASS GRAFTING x 4 (LIMA-LAD, SVG-Int 1- Int 2, SVG-PD) ENDOSCOPIC GREATER SAPHENOUS VEIN HARVEST LEFT LEG (N/A) TRANSESOPHAGEAL ECHOCARDIOGRAM (TEE) (N/A)  Total Length of Stay:  LOS: 5 days   Subjective: Awake, agitated this am. Has been hallucinating per nursing staff, but oriented to person and place for me. Bipap turned down this am with worsening of ABG. Denies dyspnea or pain.   Objective: Vital signs in last 24 hours: Temp:  [97.4 F (36.3 C)-98.9 F (37.2 C)] 97.9 F (36.6 C) (08/22 0404) Pulse Rate:  [65-111] 73 (08/22 0719) Cardiac Rhythm:  [-] Atrial fibrillation;Atrial flutter;Bundle branch block (08/21 2200) Resp:  [16-34] 28 (08/22 0719) BP: (97-144)/(45-92) 141/55 mmHg (08/22 0719) SpO2:  [91 %-100 %] 97 % (08/22 0719) Arterial Line BP: (90-160)/(45-61) 159/61 mmHg (08/22 0700) FiO2 (%):  [60 %-80 %] 60 % (08/22 0719) Weight:  [212 lb 1.3 oz (96.2 kg)] 212 lb 1.3 oz (96.2 kg) (08/22 0400)  Filed Weights   01/27/15 0500 01/28/15 0500 01/29/15 0400  Weight: 219 lb 4.8 oz (99.474 kg) 215 lb 6.2 oz (97.7 kg) 212 lb 1.3 oz (96.2 kg)  PRE-OPERATIVE WEIGHT: 95 kg  Weight change: -3 lb 4.9 oz (-1.5 kg)   CBGs 141-137-139-151     Intake/Output from previous day: 08/21 0701 - 08/22 0700 In: 2216 [I.V.:1316; IV Piggyback:300; TPN:600] Out: 2040 [Urine:2040]  Intake/Output this shift:    Current Meds: Scheduled Meds: . antiseptic oral rinse  7 mL Mouth Rinse 6 times per day  . bisacodyl  10 mg Rectal Daily  . chlorhexidine  15 mL Mouth Rinse BID  . Chlorhexidine Gluconate Cloth  6 each Topical Q0600  . enoxaparin (LOVENOX) injection  30 mg Subcutaneous Q24H  . furosemide  40 mg Intravenous Daily  . insulin aspart  0-24 Units Subcutaneous 6 times per day  . levalbuterol  0.63  mg Nebulization Q6H  . mupirocin ointment   Nasal BID  . pantoprazole (PROTONIX) IV  40 mg Intravenous QHS  . potassium chloride  10 mEq Intravenous Q1 Hr x 3  . sodium chloride  10-40 mL Intracatheter Q12H  . thiamine  100 mg Intravenous Daily   Continuous Infusions: . Marland KitchenTPN (CLINIMIX-E) Adult 40 mL/hr at 01/29/15 0700  . sodium chloride 10 mL/hr at 01/29/15 0400  . amiodarone 30 mg/hr (01/29/15 0700)  . dexmedetomidine 0.5 mcg/kg/hr (01/29/15 0700)   PRN Meds:.Place/Maintain arterial line **AND** sodium chloride, haloperidol lactate, LORazepam, metoprolol, ondansetron (ZOFRAN) IV, sodium chloride, traMADol  Physical Exam: General appearance: A little agitated, trying to pull on lines and catheter, knows he is in the hospital and had heart surgery Heart: regular rate and rhythm Lungs: diminished breath sounds bibasilar Extremities: No significant LE edema Wound: Clean and dry   Lab Results: CBC: Recent Labs  01/28/15 0416 01/28/15 1522 01/29/15 0338  WBC 9.9  --  7.2  HGB 10.8* 10.9* 10.3*  HCT 31.8* 32.0* 30.4*  PLT 167  --  171   BMET:  Recent Labs  01/28/15 0416 01/28/15 1522 01/29/15 0338  NA 135 136 137  K 4.1 3.9 3.6  CL 99* 99* 100*  CO2 28  --  29  GLUCOSE 145* 129* 151*  BUN 22* 26* 25*  CREATININE 1.18 1.20 1.00  CALCIUM 8.2*  --  8.5*    PT/INR: No results for input(s): LABPROT, INR in the last 72 hours. Radiology: Dg Chest Port 1 View  01/29/2015   CLINICAL DATA:  Shortness of breath, status post CABG G on January 24, 2015  EXAM: PORTABLE CHEST - 1 VIEW  COMPARISON:  Portable chest x-ray of January 28, 2015  FINDINGS: Positioning is limited. The lungs are well-expanded. The interstitial markings have improved increased significantly. The hemidiaphragms are obscured. The cardiac silhouette is mildly enlarged. The pulmonary vascularity is indistinct. There is no pneumothorax. The right-sided PICC line tip projects over the proximal SVC.  IMPRESSION:  Deterioration in the appearance of the pulmonary interstitium consistent with pulmonary edema. There are likely bilateral pleural effusions layering posteriorly.   Electronically Signed   By: David  Martinique M.D.   On: 01/29/2015 07:45     Assessment/Plan: S/P Procedure(s) (LRB): CORONARY ARTERY BYPASS GRAFTING x 4 (LIMA-LAD, SVG-Int 1- Int 2, SVG-PD) ENDOSCOPIC GREATER SAPHENOUS VEIN HARVEST LEFT LEG (N/A) TRANSESOPHAGEAL ECHOCARDIOGRAM (TEE) (N/A)  CV- In and out of AF/flutter, presently in SR.  BPs elevated this am, but could be related to agitation. Continue IV Amio, Lopressor until taking po's.  Pulm- remains on Bipap this am after sats dropped, earlier ABG revealed pH 7.518, pO2 63, pCO2 34.7. Continue aggressive pulm toilet.  Postop delirium/DTs- Continue restraints, prn Ativan/Haldol.   Nutrition- TNA started due to risk of aspiration.  Vol overload- diuresing well. Still with edema/effusions on CXR.   Expected postop blood loss anemia- H/H stable.  Mobilize as able, PT consulted.   [place] H 01/29/2015 9:41 AM    Metabolic encephalopathy slowly clearing/? Substance withdrawal Knows where he is and the he had heart surgery about a week ago  I have seen and examined Randall Ali. and agree with the above assessment  and plan.  [name] MD Beeper [phone] Office [phone] 01/29/2015 11:28 AM

## 2015-01-29 NOTE — Progress Notes (Signed)
PARENTERAL NUTRITION CONSULT NOTE - INITIAL  Pharmacy Consult for TPN Indication: MD - increased risk for aspiration due to BiPAP   No Known Allergies  Patient Measurements: Height: '5\' 8"'$  (172.7 cm) Weight: 212 lb 1.3 oz (96.2 kg) IBW/kg (Calculated) : 68.4  Vital Signs: Temp: 97.9 F (36.6 C) (08/22 0404) Temp Source: Axillary (08/22 0404) BP: 132/73 mmHg (08/22 0600) Pulse Rate: 70 (08/22 0600) Intake/Output from previous day: 08/21 0701 - 08/22 0700 In: 2084.5 [I.V.:1274.5; IV Piggyback:250; TPN:560] Out: 1765 [XFGHW:2993]  Labs:  Recent Labs  01/27/15 0359  01/28/15 0416 01/28/15 1522 01/29/15 0338  WBC 16.5*  --  9.9  --  7.2  HGB 10.8*  < > 10.8* 10.9* 10.3*  HCT 32.1*  < > 31.8* 32.0* 30.4*  PLT 136*  --  167  --  171  < > = values in this interval not displayed.  Recent Labs  01/26/15 1015 01/27/15 0359  01/28/15 0416 01/28/15 1522 01/29/15 0338  NA  --  131*  < > 135 136 137  K  --  4.0  < > 4.1 3.9 3.6  CL  --  96*  < > 99* 99* 100*  CO2  --  27  --  28  --  29  GLUCOSE  --  155*  < > 145* 129* 151*  BUN  --  28*  < > 22* 26* 25*  CREATININE  --  1.75*  < > 1.18 1.20 1.00  CALCIUM  --  8.3*  --  8.2*  --  8.5*  MG 2.0 2.1  --   --   --   --   PROT 5.7*  --   --   --   --   --   ALBUMIN 3.1*  --   --   --   --   --   AST 30  --   --   --   --   --   ALT 16*  --   --   --   --   --   ALKPHOS 46  --   --   --   --   --   BILITOT 0.8  --   --   --   --   --   BILIDIR 0.2  --   --   --   --   --   IBILI 0.6  --   --   --   --   --   < > = values in this interval not displayed. Estimated Creatinine Clearance: 75.1 mL/min (by C-G formula based on Cr of 1).   Recent Labs  01/28/15 1950 01/28/15 2357 01/29/15 0338  GLUCAP 141* 137* 139*   Medical History: Past Medical History  Diagnosis Date  . CAD (coronary artery disease)     s/p inferior wall infarct in 10/01. has stent in Rt coronary artery. is due a stress myoview. does have some  dyspnea on exertion  . SOB (shortness of breath) on exertion   . 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 Chest pain   . HLD (hyperlipidemia)     isdue followup lipids. on zocor 10 mg/day   . HTN (hypertension)   . Allergy     seasonal  . Cataract     removed  . Reflux esophagitis   . OSA (obstructive sleep apnea) 01/13/2015  . Erectile dysfunction   . Arthritis     all over- in  general   . Cancer     skin, melanoma  . GERD (gastroesophageal reflux disease)     pt. denies    Insulin Requirements in the past 24 hours:  10 units of SSI  Current Nutrition:  NPO Clinimix E 5/15 at 24m/hr  Nutritional Goals:  High protein, hypocaloric feedings 1200-1400 kCal, 120-130 grams of protein per day F/U RD recs  Assessment: 74yo with CAD and s/p CABG, now with some post-op complications including delerium/DT's, Afib, and resp. compromise requiring the use of BiPAP.  He is felt to be at high risk for aspiration by his surgeon and he is to be started on TPN for nutritional support. No pertinent PMH for GI issues except a history of GERD.    Endo: No hx of DM. CBGs ok (120-150s). SSI  Renal:  SCr 1.0, CrCl ~751mmin. UOP at 0.40m66mg/hr  CAD:  NSR (out of Afib) BP slightly elevated and HR ok. Amio on board  Hepatic:  Normal AST/ALT, Tbili 0.8. Albumin low at 2.6, Prealbumin 7.1.  GI: Hx. GERD - IV PPI on board.  He has functioning gut, and transitioning to TF's should be fine once risk of BiPAP issues resolved.  Lytes:  WNL, K 4.0 (replaced) Phos low at 2.4, Mg 2.3, CoCa 9.6.  Neuro:  DT's - Precdex, haldol and sedation on board - may be at risk for re-feeding syndrome   Fluids:  0.9% NS infusion at 84m33m  Access:  Picc line - 8/20  TPN start:  8/21  Plan:  Increase Clinimix E 5/15 to 70ml62mand titrate to goal rate to achieve full support (We will be unable to meet patients high protein / low caloric needs with Clinimix) Will not add IVFE for 7 days in ICU  patient per ASPEN guidelines Continue current SSI (may need to add insulin to bag tomorrow) Give 84mmo84m NaPhos today Monitor for refeeding syndrome F/U TPN labs, trial of TFs?, RD recs  Randall QuinonesmD Clinical Pharmacist Pager [phone] 7:17 AM

## 2015-01-29 NOTE — Progress Notes (Deleted)
Initial Nutrition Assessment  DOCUMENTATION CODES:   Obesity unspecified  INTERVENTION:    TPN per pharmacy   Consider post-pyloric feeding tube placement  NUTRITION DIAGNOSIS:   Inadequate oral intake related to inability to eat as evidenced by NPO status  GOAL:   Patient will meet greater than or equal to 90% of their needs  MONITOR:   Diet advancement, Labs, Weight trends, I & O's  REASON FOR ASSESSMENT:   Consult New TPN/TNA  ASSESSMENT:   74 y.o. Male with PMH of HTN, hyperlipidemia, CAD s/p myocardial infarction in 2001 with stent placed presented to the ED intermittent chest pain, palpitations.   Patient s/p procedure: CORONARY ARTERY BYPASS GRAFTING x 4  Pt currently on BiPAP.  TPN initiated given high aspiration risk.  Patient is receiving TPN with Clinimix E 5/15 @ 70 ml/hr.  No IVFE at this time.  Provides 1193 kcal and 84 grams protein per day. Meets 54% minimum estimated energy needs and 65% minimum estimated protein needs.  RD unable to complete Nutrition Focused Physical Exam at this time.  Diet Order:  Diet NPO time specified .TPN (CLINIMIX-E) Adult .TPN (CLINIMIX-E) Adult  Skin:  Reviewed, no issues  Last BM:  8/17  Height:   Ht Readings from Last 1 Encounters:  01/24/15 '5\' 8"'$  (1.727 m)    Weight:   Wt Readings from Last 1 Encounters:  01/29/15 212 lb 1.3 oz (96.2 kg)    Ideal Body Weight:  70 kg  BMI:  Body mass index is 32.25 kg/(m^2).  Estimated Nutritional Needs:   Kcal:  2200-2400  Protein:  130-140 gm  Fluid:  2.2-2.4 L  EDUCATION NEEDS:   No education needs identified at this time  Arthur Holms, RD, LDN Pager #: [phone] After-Hours Pager #: [phone]

## 2015-01-30 ENCOUNTER — Inpatient Hospital Stay (HOSPITAL_COMMUNITY): Payer: PPO

## 2015-01-30 DIAGNOSIS — Z951 Presence of aortocoronary bypass graft: Secondary | ICD-10-CM

## 2015-01-30 LAB — GLUCOSE, CAPILLARY
Glucose-Capillary: 130 mg/dL — ABNORMAL HIGH (ref 65–99)
Glucose-Capillary: 136 mg/dL — ABNORMAL HIGH (ref 65–99)
Glucose-Capillary: 139 mg/dL — ABNORMAL HIGH (ref 65–99)
Glucose-Capillary: 153 mg/dL — ABNORMAL HIGH (ref 65–99)
Glucose-Capillary: 159 mg/dL — ABNORMAL HIGH (ref 65–99)
Glucose-Capillary: 160 mg/dL — ABNORMAL HIGH (ref 65–99)
Glucose-Capillary: 163 mg/dL — ABNORMAL HIGH (ref 65–99)
Glucose-Capillary: 187 mg/dL — ABNORMAL HIGH (ref 65–99)

## 2015-01-30 LAB — POCT I-STAT 3, ART BLOOD GAS (G3+)
Acid-Base Excess: 7 mmol/L — ABNORMAL HIGH (ref 0.0–2.0)
Bicarbonate: 30.5 mEq/L — ABNORMAL HIGH (ref 20.0–24.0)
O2 Saturation: 95 %
Patient temperature: 98.6
TCO2: 32 mmol/L (ref 0–100)
pCO2 arterial: 38.4 mmHg (ref 35.0–45.0)
pH, Arterial: 7.508 — ABNORMAL HIGH (ref 7.350–7.450)
pO2, Arterial: 67 mmHg — ABNORMAL LOW (ref 80.0–100.0)

## 2015-01-30 LAB — URINE CULTURE
Culture: NO GROWTH
Special Requests: NORMAL

## 2015-01-30 LAB — TYPE AND SCREEN
ABO/RH(D): A NEG
Antibody Screen: NEGATIVE
Unit division: 0
Unit division: 0
Unit division: 0
Unit division: 0

## 2015-01-30 LAB — BLOOD GAS, ARTERIAL
Acid-Base Excess: 6.9 mmol/L — ABNORMAL HIGH (ref 0.0–2.0)
Bicarbonate: 30.2 mEq/L — ABNORMAL HIGH (ref 20.0–24.0)
Drawn by: 44138
FIO2: 100
MECHVT: 550 mL
O2 Saturation: 99.1 %
PEEP: 5 cmH2O
Patient temperature: 98.6
RATE: 14 resp/min
TCO2: 31.4 mmol/L (ref 0–100)
pCO2 arterial: 37.8 mmHg (ref 35.0–45.0)
pH, Arterial: 7.513 — ABNORMAL HIGH (ref 7.350–7.450)
pO2, Arterial: 169 mmHg — ABNORMAL HIGH (ref 80.0–100.0)

## 2015-01-30 LAB — CBC
HCT: 33.5 % — ABNORMAL LOW (ref 39.0–52.0)
Hemoglobin: 11.2 g/dL — ABNORMAL LOW (ref 13.0–17.0)
MCH: 29.8 pg (ref 26.0–34.0)
MCHC: 33.4 g/dL (ref 30.0–36.0)
MCV: 89.1 fL (ref 78.0–100.0)
Platelets: 252 10*3/uL (ref 150–400)
RBC: 3.76 MIL/uL — ABNORMAL LOW (ref 4.22–5.81)
RDW: 13.9 % (ref 11.5–15.5)
WBC: 10 10*3/uL (ref 4.0–10.5)

## 2015-01-30 LAB — BASIC METABOLIC PANEL
Anion gap: 9 (ref 5–15)
BUN: 25 mg/dL — ABNORMAL HIGH (ref 6–20)
CO2: 31 mmol/L (ref 22–32)
Calcium: 8.8 mg/dL — ABNORMAL LOW (ref 8.9–10.3)
Chloride: 99 mmol/L — ABNORMAL LOW (ref 101–111)
Creatinine, Ser: 1.29 mg/dL — ABNORMAL HIGH (ref 0.61–1.24)
GFR calc Af Amer: >60 mL/min (ref >60)
GFR calc non Af Amer: 54 mL/min — ABNORMAL LOW (ref >60)
Glucose, Bld: 182 mg/dL — ABNORMAL HIGH (ref 65–99)
Potassium: 3.7 mmol/L (ref 3.5–5.1)
Sodium: 139 mmol/L (ref 135–145)

## 2015-01-30 LAB — GRAM STAIN: Special Requests: NORMAL

## 2015-01-30 MED ORDER — MIDAZOLAM HCL 2 MG/2ML IJ SOLN
2.0000 mg | Freq: Once | INTRAMUSCULAR | Status: AC
  Administered 2015-01-30: 2 mg via INTRAVENOUS

## 2015-01-30 MED ORDER — PIPERACILLIN-TAZOBACTAM 3.375 G IVPB
3.3750 g | Freq: Three times a day (TID) | INTRAVENOUS | Status: DC
  Administered 2015-01-30 – 2015-02-02 (×10): 3.375 g via INTRAVENOUS
  Filled 2015-01-30 (×12): qty 50

## 2015-01-30 MED ORDER — ETOMIDATE 2 MG/ML IV SOLN
20.0000 mg | Freq: Once | INTRAVENOUS | Status: AC
  Administered 2015-01-30: 20 mg via INTRAVENOUS

## 2015-01-30 MED ORDER — FENTANYL CITRATE (PF) 100 MCG/2ML IJ SOLN
INTRAMUSCULAR | Status: AC
  Administered 2015-01-30: 50 ug via INTRAVENOUS
  Filled 2015-01-30: qty 2

## 2015-01-30 MED ORDER — VANCOMYCIN HCL IN DEXTROSE 750-5 MG/150ML-% IV SOLN
750.0000 mg | Freq: Two times a day (BID) | INTRAVENOUS | Status: DC
  Administered 2015-01-30 – 2015-02-01 (×5): 750 mg via INTRAVENOUS
  Filled 2015-01-30 (×7): qty 150

## 2015-01-30 MED ORDER — MIDAZOLAM HCL 2 MG/2ML IJ SOLN
1.0000 mg | INTRAMUSCULAR | Status: DC | PRN
  Filled 2015-01-30 (×3): qty 2

## 2015-01-30 MED ORDER — DEXMEDETOMIDINE HCL IN NACL 400 MCG/100ML IV SOLN
0.0000 ug/kg/h | INTRAVENOUS | Status: DC
  Administered 2015-01-30 (×3): 1 ug/kg/h via INTRAVENOUS
  Administered 2015-01-30: 0.5 ug/kg/h via INTRAVENOUS
  Administered 2015-01-30 (×2): 1 ug/kg/h via INTRAVENOUS
  Administered 2015-01-30: 0.8 ug/kg/h via INTRAVENOUS
  Administered 2015-01-31: 1 ug/kg/h via INTRAVENOUS
  Administered 2015-01-31: 1.2 ug/kg/h via INTRAVENOUS
  Administered 2015-01-31 (×2): 1 ug/kg/h via INTRAVENOUS
  Administered 2015-01-31: 1.2 ug/kg/h via INTRAVENOUS
  Administered 2015-01-31: 1 ug/kg/h via INTRAVENOUS
  Administered 2015-01-31: 1.2 ug/kg/h via INTRAVENOUS
  Administered 2015-01-31: 1 ug/kg/h via INTRAVENOUS
  Administered 2015-02-01 (×2): 0.9 ug/kg/h via INTRAVENOUS
  Filled 2015-01-30: qty 50
  Filled 2015-01-30: qty 100
  Filled 2015-01-30: qty 50
  Filled 2015-01-30 (×2): qty 100
  Filled 2015-01-30: qty 50
  Filled 2015-01-30: qty 100
  Filled 2015-01-30 (×2): qty 50
  Filled 2015-01-30: qty 150
  Filled 2015-01-30 (×3): qty 100
  Filled 2015-01-30: qty 50
  Filled 2015-01-30: qty 100
  Filled 2015-01-30 (×3): qty 50

## 2015-01-30 MED ORDER — POTASSIUM CHLORIDE 10 MEQ/50ML IV SOLN
10.0000 meq | INTRAVENOUS | Status: AC
  Administered 2015-01-30 (×2): 10 meq via INTRAVENOUS
  Filled 2015-01-30 (×2): qty 50

## 2015-01-30 MED ORDER — TRACE MINERALS CR-CU-MN-SE-ZN 10-1000-500-60 MCG/ML IV SOLN
INTRAVENOUS | Status: AC
  Administered 2015-01-30: 18:00:00 via INTRAVENOUS
  Filled 2015-01-30: qty 1680

## 2015-01-30 MED ORDER — NITROGLYCERIN IN D5W 200-5 MCG/ML-% IV SOLN
0.0000 ug/min | INTRAVENOUS | Status: DC
  Administered 2015-01-30: 10 ug/min via INTRAVENOUS

## 2015-01-30 MED ORDER — FENTANYL CITRATE (PF) 100 MCG/2ML IJ SOLN
50.0000 ug | INTRAMUSCULAR | Status: DC | PRN
  Administered 2015-01-30 – 2015-01-31 (×3): 50 ug via INTRAVENOUS
  Filled 2015-01-30 (×2): qty 2

## 2015-01-30 MED ORDER — NITROGLYCERIN IN D5W 200-5 MCG/ML-% IV SOLN
INTRAVENOUS | Status: AC
  Administered 2015-01-30: 10 ug/min via INTRAVENOUS
  Filled 2015-01-30: qty 250

## 2015-01-30 MED ORDER — ETOMIDATE 2 MG/ML IV SOLN: 0.3000 mg/kg | Freq: Once | INTRAVENOUS | Status: DC

## 2015-01-30 MED ORDER — FENTANYL CITRATE (PF) 100 MCG/2ML IJ SOLN
50.0000 ug | INTRAMUSCULAR | Status: AC | PRN
  Administered 2015-01-30 (×3): 50 ug via INTRAVENOUS
  Filled 2015-01-30 (×3): qty 2

## 2015-01-30 MED ORDER — MIDAZOLAM HCL 2 MG/2ML IJ SOLN
INTRAMUSCULAR | Status: AC
  Administered 2015-01-30: 2 mg via INTRAVENOUS
  Filled 2015-01-30: qty 2

## 2015-01-30 MED ORDER — POTASSIUM CHLORIDE 10 MEQ/50ML IV SOLN
10.0000 meq | INTRAVENOUS | Status: AC | PRN
  Administered 2015-01-30 – 2015-02-06 (×3): 10 meq via INTRAVENOUS
  Filled 2015-01-30 (×7): qty 50

## 2015-01-30 MED ORDER — VANCOMYCIN HCL 10 G IV SOLR
2000.0000 mg | Freq: Once | INTRAVENOUS | Status: AC
  Administered 2015-01-30: 2000 mg via INTRAVENOUS
  Filled 2015-01-30: qty 2000

## 2015-01-30 MED ORDER — MIDAZOLAM HCL 2 MG/2ML IJ SOLN
1.0000 mg | INTRAMUSCULAR | Status: DC | PRN
  Administered 2015-01-30 – 2015-01-31 (×5): 1 mg via INTRAVENOUS
  Filled 2015-01-30 (×2): qty 2

## 2015-01-30 MED ORDER — PANTOPRAZOLE SODIUM 40 MG PO PACK
40.0000 mg | PACK | Freq: Every day | ORAL | Status: DC
  Administered 2015-01-30 – 2015-01-31 (×2): 40 mg
  Filled 2015-01-30 (×4): qty 20

## 2015-01-30 MED ORDER — FENTANYL CITRATE (PF) 100 MCG/2ML IJ SOLN
50.0000 ug | Freq: Once | INTRAMUSCULAR | Status: AC
  Administered 2015-01-30: 50 ug via INTRAVENOUS

## 2015-01-30 NOTE — Progress Notes (Signed)
PARENTERAL NUTRITION CONSULT NOTE - INITIAL  Pharmacy Consult for TPN Indication: MD - increased risk for aspiration due to BiPAP   No Known Allergies  Patient Measurements: Height: '5\' 8"'$  (172.7 cm) Weight: 205 lb 0.4 oz (93 kg) IBW/kg (Calculated) : 68.4  Vital Signs: Temp: 100.6 F (38.1 C) (08/23 0457) Temp Source: Oral (08/23 0457) BP: 101/61 mmHg (08/23 0630) Pulse Rate: 70 (08/23 0630) Intake/Output from previous day: 08/22 0701 - 08/23 0700 In: 2582 [I.V.:901.5; NG/GT:30; IV Piggyback:503; TPN:1147.5] Out: 7253 [Urine:4390]  Labs:  Recent Labs  01/28/15 0416 01/28/15 1522 01/29/15 0338 01/30/15 0518  WBC 9.9  --  7.2 10.0  HGB 10.8* 10.9* 10.3* 11.2*  HCT 31.8* 32.0* 30.4* 33.5*  PLT 167  --  171 252    Recent Labs  01/29/15 0338 01/29/15 0745 01/30/15 0518  NA 137 138 139  K 3.6 4.0 3.7  CL 100* 103 99*  CO2 '29 27 31  '$ GLUCOSE 151* 146* 182*  BUN 25* 24* 25*  CREATININE 1.00 0.99 1.29*  CALCIUM 8.5* 8.7* 8.8*  MG  --  2.3  --   PHOS  --  2.4*  --   PROT  --  5.4*  --   ALBUMIN  --  2.6*  --   AST  --  16  --   ALT  --  16*  --   ALKPHOS  --  46  --   BILITOT  --  0.8  --   PREALBUMIN  --  7.1*  --    Estimated Creatinine Clearance: 57.3 mL/min (by C-G formula based on Cr of 1.29).   Recent Labs  01/29/15 1929 01/30/15 0047 01/30/15 0449  GLUCAP 158* 160* 187*   Medical History: Past Medical History  Diagnosis Date  . CAD (coronary artery disease)     s/p inferior wall infarct in 10/01. has stent in Rt coronary artery. is due a stress myoview. does have some dyspnea on exertion  . SOB (shortness of breath) on exertion   . 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 Chest pain   . HLD (hyperlipidemia)     isdue followup lipids. on zocor 10 mg/day   . HTN (hypertension)   . Allergy     seasonal  . Cataract     removed  . Reflux esophagitis   . OSA (obstructive sleep apnea) 01/13/2015  . Erectile dysfunction   .  Arthritis     all over- in general   . Cancer     skin, melanoma  . GERD (gastroesophageal reflux disease)     pt. denies    Insulin Requirements in the past 24 hours:  16 units of SSI  Current Nutrition:  NPO Clinimix E 5/15 at 73m/hr  Nutritional Goals:  Kcal: 2200-2400 Protein: 130-140 g Fluid: 2.2-2.4 L  Assessment: 74yo with CAD and s/p CABG, now with some post-op complications including delerium/DT's, Afib, and resp. compromise requiring the use of BiPAP.  He is felt to be at high risk for aspiration by his surgeon and he is to be started on TPN for nutritional support. No pertinent PMH for GI issues except a history of GERD.    Endo: No hx of DM. CBGs increased to 150-180s from day before. SSI  Renal:  SCr up to 1.29, CrCl ~559mmin. UOP at 2.60m860mg/hr with lasix.  CAD:  NSR (out of Afib) BP and HR ok. Amio on board, lopressor, furosemide  Hepatic:  Normal AST/ALT,  Tbili 0.8. Albumin low at 2.6, Prealbumin 7.1.  GI: Hx. GERD - IV PPI on board.  He has functioning gut, and transitioning to TF's should be fine once risk of BiPAP issues resolved.  Lytes:  WNL, K 3.7 (replaced) Phos low at 2.4 and Mg 2.3 on 8/22, CoCa 9.7.  Neuro:  DT's - Precdex, haldol and sedation on board - may be at risk for re-feeding syndrome   ID: Day # 1 of Zosyn for GNR bacteremia. Low grade fever of 100.6, WBC up to 10.  8/22 Blood cx > 1/2 GNRs  8/23 Zosyn >>  Fluids:  0.9% NS infusion at 72m/hr  Access:  Picc line - 8/20  TPN start:  8/21  Plan:  Continue Clinimix E 5/15 at 730mhr today and titrate to goal rate once CBGs controlled Will not add IVFE for 7 days in ICU patient per ASPEN guidelines Add 15 units of regular insulin to TPN Continue current SSI Continue MVI and trace elements in TPN Give an extra 1044mof K runs x 2  Monitor for refeeding syndrome F/U TPN labs, Bmet, Phos, Mg, trial of TFs?  NatElenor QuinonesharmD Clinical Pharmacist Pager  [phone] 7:02 AM

## 2015-01-30 NOTE — Progress Notes (Signed)
PULMONARY / CRITICAL CARE MEDICINE   Name: Randall Ali. MRN: [id number] DOB: May 13, 1941    ADMISSION DATE:  01/24/2015 CONSULTATION DATE:  01/29/2015  REFERRING MD :  Dr. Servando Snare  CHIEF COMPLAINT:  Dyspnea  INITIAL PRESENTATION: 74 year old male with recent dx of 3 vessel CAD was admitted for CABG 8/17. The procedure was without complication. Hospital course has been complicated by post-op AF, severe delirium requiring precedex, and dyspnea requiring BiPAP for hypoxia. He has not been able to come off BiPAP, CXR concerning for edema. PCCM asked to evaluate. Blood CX showing GNR  STUDIES:  8/11 cardiac cath > Severe ostial LAD disease, moderate to severe OM1 and OM 2 as well as RCA disease. Referred for CABG Ct chest 8/16 > Stable non-calcified RUL nodule. No acute findings.  Echo 8/16 > LVEF 55-60%, Grade 1 DD, trace MR, TR   SIGNIFICANT EVENTS: 8/17 > CABG 8/20 > dyspnea requiring BiPAP 8/22 Dyspnea worsening, PCCM consult, intubated 8/23 am    SUBJECTIVE: febrile Good UO   VITAL SIGNS: Temp:  [99.7 F (37.6 C)-102 F (38.9 C)] 100.6 F (38.1 C) (08/23 0457) Pulse Rate:  [35-103] 71 (08/23 0800) Resp:  [13-46] 21 (08/23 0800) BP: (83-181)/(52-157) 100/56 mmHg (08/23 0800) SpO2:  [92 %-100 %] 99 % (08/23 0800) Arterial Line BP: (170)/(60) 170/60 mmHg (08/22 0900) FiO2 (%):  [45 %-100 %] 60 % (08/23 0800) Weight:  [205 lb 0.4 oz (93 kg)] 205 lb 0.4 oz (93 kg) (08/23 0430) HEMODYNAMICS:   VENTILATOR SETTINGS: Vent Mode:  [-] PRVC FiO2 (%):  [45 %-100 %] 60 % Set Rate:  [14 bmp] 14 bmp Vt Set:  [550 mL] 550 mL PEEP:  [5 cmH20] 5 cmH20 Plateau Pressure:  [10 cmH20-13 cmH20] 10 cmH20 INTAKE / OUTPUT:  Intake/Output Summary (Last 24 hours) at 01/30/15 0855 Last data filed at 01/30/15 0800  Gross per 24 hour  Intake 2829.22 ml  Output   4515 ml  Net -1685.78 ml    PHYSICAL EXAMINATION: General:  Acutely ill appearing male ,sedated on precedex gtt Neuro:   RASS-2, follows commands HEENT:  [place]/AT, no JVD noted, PERRL Cardiovascular:  Borderline Tachy, sinus. No MRG Lungs:  Diminished R > L. Abdomen:  Soft, non-tender, non-distended Musculoskeletal:  No acute deformity of ROM limitation Skin:  Grossly intact.   LABS:  CBC  Recent Labs Lab 01/28/15 0416 01/28/15 1522 01/29/15 0338 01/30/15 0518  WBC 9.9  --  7.2 10.0  HGB 10.8* 10.9* 10.3* 11.2*  HCT 31.8* 32.0* 30.4* 33.5*  PLT 167  --  171 252   Coag's  Recent Labs Lab 01/24/15 1436  APTT 36  INR 1.34   BMET  Recent Labs Lab 01/29/15 0338 01/29/15 0745 01/30/15 0518  NA 137 138 139  K 3.6 4.0 3.7  CL 100* 103 99*  CO2 '29 27 31  '$ BUN 25* 24* 25*  CREATININE 1.00 0.99 1.29*  GLUCOSE 151* 146* 182*   Electrolytes  Recent Labs Lab 01/26/15 1015 01/27/15 0359  01/29/15 0338 01/29/15 0745 01/30/15 0518  CALCIUM  --  8.3*  < > 8.5* 8.7* 8.8*  MG 2.0 2.1  --   --  2.3  --   PHOS  --   --   --   --  2.4*  --   < > = values in this interval not displayed. Sepsis Markers  Recent Labs Lab 01/29/15 1330  LATICACIDVEN 1.0   ABG  Recent Labs Lab 01/29/15 0340  01/30/15 0044 01/30/15 0523  PHART 7.518* 7.508* 7.513*  PCO2ART 34.7* 38.4 37.8  PO2ART 63.0* 67.0* 169*   Liver Enzymes  Recent Labs Lab 01/26/15 1015 01/29/15 0745  AST 30 16  ALT 16* 16*  ALKPHOS 46 46  BILITOT 0.8 0.8  ALBUMIN 3.1* 2.6*   Cardiac Enzymes No results for input(s): TROPONINI, PROBNP in the last 168 hours. Glucose  Recent Labs Lab 01/29/15 0838 01/29/15 1301 01/29/15 1714 01/29/15 1929 01/30/15 0047 01/30/15 0449  GLUCAP 146* 163* 151* 158* 160* 187*   I/O last 3 completed shifts: In: 3894.3 [I.V.:1413.8; NG/GT:30; IV ZOXWRUEAV:409] Out: 8119 [Urine:5320] Total I/O In: 183.2 [I.V.:63.2; IV Piggyback:50; TPN:70] Out: 200 [Urine:100; Emesis/NG output:100]    Imaging Dg Chest Port 1 View  01/30/2015   CLINICAL DATA:  Acute on chronic respiratory  failure. Post intubation.  EXAM: PORTABLE CHEST - 1 VIEW  COMPARISON:  Yesterday at 0615 hour  FINDINGS: Endotracheal tube is 4.3 cm from the carina. Enteric tube in place, tip and side port below the diaphragm in the stomach. Tip of the right upper extremities central catheter in the mid SVC.  Patient is post median sternotomy, unchanged cardiomegaly. Improved bibasilar aeration with decreasing pleural effusions. Improving pulmonary edema. No pneumothorax.  IMPRESSION: 1. Endotracheal tube 4.3 cm from the carina.  Enteric tube in place. 2. Improving bibasilar aeration with decreasing pleural effusions and improving pulmonary edema.   Electronically Signed   By: Jeb Levering M.D.   On: 01/30/2015 03:44     ASSESSMENT / PLAN:  PULMONARY A: ETT 8/23 >> Acute hypoxic respiratory failure Pulmonary Edema Low suspicion amiodarone lung tox Mod-severe OSA by sleep study 7/28  P:   SBTs CPAP once extubated(Titration study needed as outpt)  CARDIOVASCULAR A:  3 vessel CAD s/p CABG 8/17 Post operative Afib HTN RUE PICC 8/20 >> P:  Management per TCTS On Amio for AF Scheduled and PRN metoprolol Off nTG  RENAL A:   Metabolic alkalosis ?diuresis related  P:  Follow Bmet Aggressive K (keep > 4), mag (keep > 2) supplementation KVO IVF  GASTROINTESTINAL A:   Nutrition  P:   TPN per primary Can we use gut ?  HEMATOLOGIC A:   VTE prophylaxis  P:  SQ lovenox  INFECTIOUS A:   GNR sepsis ? source bld 8/22 >> GNR resp 8/23 >> Wound 8/23 >> P:   8/23 zosyn >>  ENDOCRINE A:   Hyperglycemia with no DM history  P:   CBG monitoring and SSI per primary  NEUROLOGIC A:   Acute metabolic encephalopathy (improving) ? ETOH withdrawal/DTs  P:   RASS goal: 0 Precedex gtt per ICU CIWA protocol PRN Ativan/Haldol Monitor QTc on tele   FAMILY  - Updates:   - Inter-disciplinary family meet or Palliative Care meeting due by:  8/24    TODAY'S SUMMARY: Severe delerium  ? DTs with GNR sepsis , s/p CABG   The patient is critically ill with multiple organ systems failure and requires high complexity decision making for assessment and support, frequent evaluation and titration of therapies, application of advanced monitoring technologies and extensive interpretation of multiple databases. Critical Care Time devoted to patient care services described in this note independent of APP time is 35 minutes.    Kara Mead MD. [place] Flood.  Pulmonary & Critical care Pager [phone] If no response call [phone]    01/30/2015 8:55 AM

## 2015-01-30 NOTE — Progress Notes (Signed)
CRITICAL VALUE ALERT  Critical value received:  Gram + cocci; gram - rods  Date of notification:  01/30/15  Time of notification:  0900  Critical value read back:Yes.    Nurse who received alert:  Berenice Primas, RN  MD notified (1st page):  Dr. Elsworth Soho  Time of first page:  0900  MD notified (2nd page):  Time of second page:  Responding MD:  Dr. Elsworth Soho  Time MD responded:  0900

## 2015-01-30 NOTE — Progress Notes (Signed)
PCCM Interval Note  Re-evaluated pt on NIPPV. He is tachypneic, somewhat agitated on low dose precedex. Not clear to me that all of his distress is due to agitation or delirium. Suspect being driven by acute-on-chronic hypoxic resp failure. He will need to be intubated to stabilize.  Will proceed.   Independent CC time 30 minutes.   Baltazar Apo, MD, PhD 01/30/2015, 3:12 AM [place] [facility] [phone] or if no answer [phone]

## 2015-01-30 NOTE — Progress Notes (Signed)
CRITICAL VALUE ALERT  Critical value received:  +blood cultures  Date of notification:  8/23  Time of notification:  0642  Critical value read back:Yes.    Nurse who received alert:  Jaynie Bream  MD notified (1st page):  Dr Oletta Darter  Time of first page:  [phone]

## 2015-01-30 NOTE — Progress Notes (Signed)
[place] Progress Note Patient Name: Bishoy Cupp. DOB: Jan 29, 1941 MRN: [id number]   Date of Service  01/30/2015  HPI/Events of Note  Called d/t agitation, however, patient has been in Pulmonary Edema treated with BiPAP and diuresis. RR = 36 - 41 on BiPAP. Also on sedation with Precedex IV infusion. BP = 144/96.   eICU Interventions  Will order: 1. ABG now.  2. Nitroglycerin IV infusion at 10 mcg/min. Titrate to SBP about 110 in an attempt to preload reduce the patient.      Intervention Category Intermediate Interventions: Respiratory distress - evaluation and management Minor Interventions: Agitation / anxiety - evaluation and management  Marissah Vandemark Cornelia Copa 01/30/2015, 12:31 AM

## 2015-01-30 NOTE — Progress Notes (Addendum)
[facility] Progress Note Patient Name: Randall Ali. DOB: [birth year] MRN: [id number]   Date of Service  01/30/2015  HPI/Events of Note  Blood cultures positive for GNR's. Patient is not on Abx.  eICU Interventions  Will order: 1. Zosyn per pharmacy consult.     Intervention Category Major Interventions: Infection - evaluation and management  Randall Ali Eugene 01/30/2015, 7:07 AM

## 2015-01-30 NOTE — Progress Notes (Addendum)
ANTIBIOTIC CONSULT NOTE - INITIAL  Pharmacy Consult for vanc Indication: Bacteremia  No Known Allergies  Patient Measurements: Height: '5\' 8"'$  (172.7 cm) Weight: 205 lb 0.4 oz (93 kg) IBW/kg (Calculated) : 68.4   Vital Signs: Temp: 98.7 F (37.1 C) (08/23 0900) Temp Source: Oral (08/23 0457) BP: 116/65 mmHg (08/23 0900) Pulse Rate: 69 (08/23 0900) Intake/Output from previous day: 08/22 0701 - 08/23 0700 In: 2715.1 [I.V.:964.6; NG/GT:30; IV Piggyback:503; TPN:1217.5] Out: 4390 [Urine:4390] Intake/Output from this shift: Total I/O In: 354 [I.V.:114; IV Piggyback:100; TPN:140] Out: 235 [Urine:135; Emesis/NG output:100]  Labs:  Recent Labs  01/28/15 0416 01/28/15 1522 01/29/15 0338 01/29/15 0745 01/30/15 0518  WBC 9.9  --  7.2  --  10.0  HGB 10.8* 10.9* 10.3*  --  11.2*  PLT 167  --  171  --  252  CREATININE 1.18 1.20 1.00 0.99 1.29*   Estimated Creatinine Clearance: 57.3 mL/min (by C-G formula based on Cr of 1.29). No results for input(s): VANCOTROUGH, VANCOPEAK, VANCORANDOM, GENTTROUGH, GENTPEAK, GENTRANDOM, TOBRATROUGH, TOBRAPEAK, TOBRARND, AMIKACINPEAK, AMIKACINTROU, AMIKACIN in the last 72 hours.   Microbiology: Recent Results (from the past 720 hour(s))  Surgical pcr screen     Status: Abnormal   Collection Time: 01/22/15 10:28 AM  Result Value Ref Range Status   MRSA, PCR NEGATIVE NEGATIVE Final   Staphylococcus aureus POSITIVE (A) NEGATIVE Final    Comment:        The Xpert SA Assay (FDA approved for NASAL specimens in patients over 75 years of age), is one component of a comprehensive surveillance program.  Test performance has been validated by [facility] for patients greater than or equal to 76 year old. It is not intended to diagnose infection nor to guide or monitor treatment.   Culture, expectorated sputum-assessment     Status: None   Collection Time: 01/29/15  1:30 PM  Result Value Ref Range Status   Specimen Description SPUTUM  Final   Special Requests Normal  Final   Sputum evaluation   Final    MICROSCOPIC FINDINGS SUGGEST THAT THIS SPECIMEN IS NOT REPRESENTATIVE OF LOWER RESPIRATORY SECRETIONS. PLEASE RECOLLECT. Results Called to: Reece Levy AT 6269 01/29/15 BY L BENFIELD    Report Status 01/29/2015 FINAL  Final  Culture, blood (routine x 2)     Status: None (Preliminary result)   Collection Time: 01/29/15  2:21 PM  Result Value Ref Range Status   Specimen Description BLOOD LEFT ANTECUBITAL  Final   Special Requests BOTTLES DRAWN AEROBIC AND ANAEROBIC 10CC  Final   Culture  Setup Time   Final    GRAM NEGATIVE RODS ANAEROBIC BOTTLE ONLY CRITICAL RESULT CALLED TO, READ BACK BY AND VERIFIED WITH: J. Gibson Ramp AT [id number] ON [id number] BY Rhea Bleacher    Culture PENDING  Incomplete   Report Status PENDING  Incomplete  Culture, blood (routine x 2)     Status: None (Preliminary result)   Collection Time: 01/29/15  2:30 PM  Result Value Ref Range Status   Specimen Description BLOOD LEFT ARM  Final   Special Requests BOTTLES DRAWN AEROBIC AND ANAEROBIC 5CC  Final   Culture  Setup Time   Final    GRAM NEGATIVE RODS GRAM POSITIVE COCCI IN BOTH AEROBIC AND ANAEROBIC BOTTLES CRITICAL RESULT CALLED TO, READ BACK BY AND VERIFIED WITH: Limmie Patricia  0093 '@08'$ /23/16 MKELLY CRITICAL RESULT CALLED TO, READ BACK BY AND VERIFIED WITH: Thomasenia Bottoms, RN AT [phone] ON [id number] BY Rhea Bleacher NOTIFIED 2 ORGANISMS WERE PRESENT IN  EACH BOTTLE    Culture PENDING  Incomplete   Report Status PENDING  Incomplete  Gram stain     Status: None   Collection Time: 01/30/15  7:54 AM  Result Value Ref Range Status   Specimen Description WOUND STERNUM  Final   Special Requests Normal  Final   Gram Stain   Final    FEW WBC PRESENT, PREDOMINANTLY PMN FEW GRAM POSITIVE COCCI IN PAIRS RARE GRAM VARIABLE ROD    Report Status 01/30/2015 FINAL  Final    Medical History: Past Medical History  Diagnosis Date  . CAD (coronary artery disease)     s/p inferior wall  infarct in 10/01. has stent in Rt coronary artery. is due a stress myoview. does have some dyspnea on exertion  . SOB (shortness of breath) on exertion   . 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 Chest pain   . HLD (hyperlipidemia)     isdue followup lipids. on zocor 10 mg/day   . HTN (hypertension)   . Allergy     seasonal  . Cataract     removed  . Reflux esophagitis   . OSA (obstructive sleep apnea) 01/13/2015  . Erectile dysfunction   . Arthritis     all over- in general   . Cancer     skin, melanoma  . GERD (gastroesophageal reflux disease)     pt. denies     Assessment: Admitted 01/24/15 for OHS. Now with bacteremia, and pharmacy consulted to dose vancomycin. Also started on Zosyn per MD. Tmax/24h 102, currently afeb, WBC wnl. Post-op abx completed 8/18. SCr increased today 1>>1.29, CrCl~57, UOP good 2.1 ml/kg/h.  8/23 vanc>> 8/23 zosyn>>  8/22 BCx2>> GNR 1/2, GNR/GPC 1/2 8/22 Resp cx>>   Goal of Therapy:  Vancomycin trough level 15-20 mcg/ml  Plan:  Vanc '1750mg'$  IV x 1 dose; then Vanc '750mg'$  IV q12h Zosyn 3.375g IV q8h per MD May need to adjust vanc dose if SCr continues to trend up Mon clinical progress, c/s, abx plan, VT'@SS'$  as indicated  Elicia Lamp, PharmD Clinical Pharmacist Pager [phone] 01/30/2015 9:42 AM

## 2015-01-30 NOTE — Progress Notes (Signed)
Nutrition Follow-up  DOCUMENTATION CODES:   Obesity unspecified  INTERVENTION:    TPN per pharmacy   Recommend post pyloric feeding tube placement and transition to EN  NUTRITION DIAGNOSIS:   Inadequate oral intake related to inability to eat as evidenced by NPO status, ongoing  NEW GOAL:   Provide needs based on ASPEN/SCCM guidelines, currently unmet  MONITOR:   Vent status, Labs, Weight trends, I & O's  ASSESSMENT:   74 y.o. Male with PMH of HTN, hyperlipidemia, CAD s/p myocardial infarction in 2001 with stent placed presented to the ED intermittent chest pain, palpitations.  Patient s/p procedure 8/17: CORONARY ARTERY BYPASS GRAFTING x 4  Patient is currently intubated on ventilator support -- OGT in place MV: 12.5 L/min Temp (24hrs), Avg:100.7 F (38.2 C), Min:98.7 F (37.1 C), Max:102 F (38.9 C)   Patient is receiving TPN with Clinimix E 5/15 @ 70 ml/hr. No IVFE at this time. Provides 1193 kcal and 84 grams protein per day. Meets 54% minimum estimated energy needs and 65% minimum estimated protein needs.  Diet Order:  Diet NPO time specified .TPN (CLINIMIX-E) Adult .TPN (CLINIMIX-E) Adult  Skin:  Reviewed, no issues  Last BM:  8/17  Height:   Ht Readings from Last 1 Encounters:  01/30/15 '5\' 8"'$  (1.727 m)    Weight:   Wt Readings from Last 1 Encounters:  01/30/15 205 lb 0.4 oz (93 kg)    Ideal Body Weight:  70 kg  BMI:  Body mass index is 31.18 kg/(m^2).  Re-estimated Nutritional Needs:   Kcal:  9532-0233  Protein:  140-150 gm  Fluid:  per MD  EDUCATION NEEDS:   No education needs identified at this time  Arthur Holms, RD, LDN Pager #: [phone] After-Hours Pager #: [phone]

## 2015-01-30 NOTE — Progress Notes (Signed)
PT Cancellation Note  Patient Details Name: Randall Ali. MRN: [id number] DOB: [birth year]   Cancelled Treatment:    Reason Eval/Treat Not Completed: Medical issues which prohibited therapy; patient intubated again early this am per RN, but expect to wean soon.  Will attempt again tomorrow.   WYNN,CYNDI 01/30/2015, 11:44 AM  Magda Kiel, PT [phone] 01/30/2015

## 2015-01-30 NOTE — Procedures (Signed)
Intubation Procedure Note Paydon Carll [id number] 1940/12/17  Procedure: Intubation Indications: Respiratory insufficiency  Procedure Details Consent: Risks of procedure as well as the alternatives and risks of each were explained to the (patient/caregiver).  Consent for procedure obtained. Time Out: Verified patient identification, verified procedure, site/side was marked, verified correct patient position, special equipment/implants available, medications/allergies/relevent history reviewed, required imaging and test results available.  Performed  Maximum sterile technique was used including gloves and hand hygiene.  Meds: etomidate '40mg'$ , fentanyl 48mg, versed '2mg'$  Posterior pharynx and glottis visualized and dried secretions removed with suction. 8.0 ETT was placed under direct visualization with glidescope. Position confirmed by auscultation, ETCO2.     Evaluation Hemodynamic Status: BP stable throughout; O2 sats: stable throughout Patient's Current Condition: stable Complications: No apparent complications Patient did tolerate procedure well. Chest X-ray ordered to verify placement.  CXR: tube position acceptable.  RBaltazar Apo MD, PhD 01/30/2015, 3:43 AM [place] [facility] [phone] if no answer [phone]

## 2015-01-30 NOTE — Progress Notes (Signed)
Patient ID: Dimarco Minkin., male   DOB: 1940-12-13, 74 y.o.   MRN: [id number] TCTS DAILY ICU PROGRESS NOTE                   [address]            [place] [address]          [phone]   6 Days Post-Op Procedure(s) (LRB): CORONARY ARTERY BYPASS GRAFTING x 4 (LIMA-LAD, SVG-Int 1- Int 2, SVG-PD) ENDOSCOPIC GREATER SAPHENOUS VEIN HARVEST LEFT LEG (N/A) TRANSESOPHAGEAL ECHOCARDIOGRAM (TEE) (N/A)  Total Length of Stay:  LOS: 6 days   Subjective: Sedated on vent but opens eyes and follows commands  Objective: Vital signs in last 24 hours: Temp:  [99.7 F (37.6 C)-102 F (38.9 C)] 100.6 F (38.1 C) (08/23 0457) Pulse Rate:  [35-103] 75 (08/23 0715) Cardiac Rhythm:  [-] Normal sinus rhythm (08/22 2000) Resp:  [13-46] 28 (08/23 0715) BP: (83-197)/(52-157) 103/56 mmHg (08/23 0715) SpO2:  [92 %-100 %] 100 % (08/23 0715) Arterial Line BP: (170-187)/(60-65) 170/60 mmHg (08/22 0900) FiO2 (%):  [45 %-100 %] 60 % (08/23 0715) Weight:  [205 lb 0.4 oz (93 kg)] 205 lb 0.4 oz (93 kg) (08/23 0430)  Filed Weights   01/28/15 0500 01/29/15 0400 01/30/15 0430  Weight: 215 lb 6.2 oz (97.7 kg) 212 lb 1.3 oz (96.2 kg) 205 lb 0.4 oz (93 kg)    Weight change: -7 lb 0.9 oz (-3.2 kg)   Hemodynamic parameters for last 24 hours:    Intake/Output from previous day: 08/22 0701 - 08/23 0700 In: 2715.1 [I.V.:964.6; NG/GT:30; IV Piggyback:503; TPN:1217.5] Out: 7322 [Urine:4390]  Intake/Output this shift:    Current Meds: Scheduled Meds: . antiseptic oral rinse  7 mL Mouth Rinse 6 times per day  . bisacodyl  10 mg Rectal Daily  . chlorhexidine  15 mL Mouth Rinse BID  . enoxaparin (LOVENOX) injection  30 mg Subcutaneous Q24H  . furosemide  40 mg Intravenous Daily  . insulin aspart  0-24 Units Subcutaneous 6 times per day  . levalbuterol  0.63 mg Nebulization Q6H  . metoprolol  5 mg Intravenous Q12H  . mupirocin ointment   Nasal BID  . pantoprazole (PROTONIX) IV  40 mg  Intravenous QHS  . piperacillin-tazobactam (ZOSYN)  IV  3.375 g Intravenous 3 times per day  . potassium chloride  10 mEq Intravenous Q1 Hr x 2  . sodium chloride  10-40 mL Intracatheter Q12H  . thiamine  100 mg Intravenous Daily   Continuous Infusions: . Marland KitchenTPN (CLINIMIX-E) Adult 70 mL/hr at 01/30/15 0700  . Marland KitchenTPN (CLINIMIX-E) Adult    . sodium chloride 10 mL/hr at 01/30/15 0700  . amiodarone 30 mg/hr (01/30/15 0700)  . dexmedetomidine 1.002 mcg/kg/hr (01/30/15 0700)  . nitroGLYCERIN Stopped (01/30/15 0300)   PRN Meds:.Place/Maintain arterial line **AND** sodium chloride, fentaNYL (SUBLIMAZE) injection, haloperidol lactate, LORazepam, metoprolol, midazolam, midazolam, ondansetron (ZOFRAN) IV, potassium chloride, sodium chloride, traMADol  General appearance: sedated Neurologic: intact Heart: regular rate and rhythm, S1, S2 normal, no murmur, click, rub or gallop Lungs: diminished breath sounds bilaterally Abdomen: soft, non-tender; bowel sounds normal; no masses,  no organomegaly Extremities: extremities normal, atraumatic, no cyanosis or edema and Homans sign is negative, no sign of DVT Wound: some serous drainage from lower sternum, incision intact and bone stable- culture done  Lab Results: CBC: Recent Labs  01/29/15 0338 01/30/15 0518  WBC 7.2 10.0  HGB 10.3* 11.2*  HCT 30.4* 33.5*  PLT 171 252   BMET:  Recent Labs  01/29/15 0745 01/30/15 0518  NA 138 139  K 4.0 3.7  CL 103 99*  CO2 27 31  GLUCOSE 146* 182*  BUN 24* 25*  CREATININE 0.99 1.29*  CALCIUM 8.7* 8.8*    Lactate 1.0   PT/INR: No results for input(s): LABPROT, INR in the last 72 hours. Radiology: Dg Chest Port 1 View  01/30/2015   CLINICAL DATA:  Acute on chronic respiratory failure. Post intubation.  EXAM: PORTABLE CHEST - 1 VIEW  COMPARISON:  Yesterday at 0615 hour  FINDINGS: Endotracheal tube is 4.3 cm from the carina. Enteric tube in place, tip and side port below the diaphragm in the stomach. Tip  of the right upper extremities central catheter in the mid SVC.  Patient is post median sternotomy, unchanged cardiomegaly. Improved bibasilar aeration with decreasing pleural effusions. Improving pulmonary edema. No pneumothorax.  IMPRESSION: 1. Endotracheal tube 4.3 cm from the carina.  Enteric tube in place. 2. Improving bibasilar aeration with decreasing pleural effusions and improving pulmonary edema.   Electronically Signed   By: Jeb Levering M.D.   On: 01/30/2015 03:44   Results for orders placed or performed during the hospital encounter of 01/24/15  Culture, expectorated sputum-assessment     Status: None   Collection Time: 01/29/15  1:30 PM  Result Value Ref Range Status   Specimen Description SPUTUM  Final   Special Requests Normal  Final   Sputum evaluation   Final    MICROSCOPIC FINDINGS SUGGEST THAT THIS SPECIMEN IS NOT REPRESENTATIVE OF LOWER RESPIRATORY SECRETIONS. PLEASE RECOLLECT. Results Called to: Reece Levy AT [id number] 01/29/15 BY L BENFIELD    Report Status 01/29/2015 FINAL  Final  Culture, blood (routine x 2)     Status: None (Preliminary result)   Collection Time: 01/29/15  2:21 PM  Result Value Ref Range Status   Specimen Description BLOOD LEFT ANTECUBITAL  Final   Special Requests BOTTLES DRAWN AEROBIC AND ANAEROBIC 10CC  Final   Culture PENDING  Incomplete   Report Status PENDING  Incomplete  Culture, blood (routine x 2)     Status: None (Preliminary result)   Collection Time: 01/29/15  2:30 PM  Result Value Ref Range Status   Specimen Description BLOOD LEFT ARM  Final   Special Requests BOTTLES DRAWN AEROBIC AND ANAEROBIC 5CC  Final   Culture  Setup Time   Final    GRAM NEGATIVE RODS ANAEROBIC BOTTLE ONLY CRITICAL RESULT CALLED TO, READ BACK BY AND VERIFIED WITH: Limmie Patricia  1940 '@08'$ /23/16 MKELLY    Culture PENDING  Incomplete   Report Status PENDING  Incomplete   Urine culture pending   Assessment/Plan: S/P Procedure(s) (LRB): CORONARY ARTERY BYPASS  GRAFTING x 4 (LIMA-LAD, SVG-Int 1- Int 2, SVG-PD) ENDOSCOPIC GREATER SAPHENOUS VEIN HARVEST LEFT LEG (N/A) TRANSESOPHAGEAL ECHOCARDIOGRAM (TEE) (N/A) Gram negative rods in blood from culture done last pm, now on zyson On vent fro respiratory distress  Wbc not elevated Continue nutriention  and vent support  Check wound culture gram stain    [name] 01/30/2015 7:42 AM

## 2015-01-31 ENCOUNTER — Encounter (HOSPITAL_COMMUNITY): Payer: Self-pay | Admitting: *Deleted

## 2015-01-31 ENCOUNTER — Inpatient Hospital Stay (HOSPITAL_COMMUNITY): Payer: PPO

## 2015-01-31 DIAGNOSIS — A419 Sepsis, unspecified organism: Secondary | ICD-10-CM

## 2015-01-31 LAB — BASIC METABOLIC PANEL
Anion gap: 9 (ref 5–15)
BUN: 32 mg/dL — ABNORMAL HIGH (ref 6–20)
CO2: 33 mmol/L — ABNORMAL HIGH (ref 22–32)
Calcium: 8.5 mg/dL — ABNORMAL LOW (ref 8.9–10.3)
Chloride: 99 mmol/L — ABNORMAL LOW (ref 101–111)
Creatinine, Ser: 1.27 mg/dL — ABNORMAL HIGH (ref 0.61–1.24)
GFR calc Af Amer: >60 mL/min (ref >60)
GFR calc non Af Amer: 55 mL/min — ABNORMAL LOW (ref >60)
Glucose, Bld: 148 mg/dL — ABNORMAL HIGH (ref 65–99)
Potassium: 3.6 mmol/L (ref 3.5–5.1)
Sodium: 141 mmol/L (ref 135–145)

## 2015-01-31 LAB — GLUCOSE, CAPILLARY
Glucose-Capillary: 139 mg/dL — ABNORMAL HIGH (ref 65–99)
Glucose-Capillary: 140 mg/dL — ABNORMAL HIGH (ref 65–99)
Glucose-Capillary: 149 mg/dL — ABNORMAL HIGH (ref 65–99)
Glucose-Capillary: 151 mg/dL — ABNORMAL HIGH (ref 65–99)
Glucose-Capillary: 159 mg/dL — ABNORMAL HIGH (ref 65–99)
Glucose-Capillary: 167 mg/dL — ABNORMAL HIGH (ref 65–99)
Glucose-Capillary: 185 mg/dL — ABNORMAL HIGH (ref 65–99)

## 2015-01-31 LAB — CBC
HCT: 32.4 % — ABNORMAL LOW (ref 39.0–52.0)
Hemoglobin: 10.6 g/dL — ABNORMAL LOW (ref 13.0–17.0)
MCH: 29.4 pg (ref 26.0–34.0)
MCHC: 32.7 g/dL (ref 30.0–36.0)
MCV: 90 fL (ref 78.0–100.0)
Platelets: 236 10*3/uL (ref 150–400)
RBC: 3.6 MIL/uL — ABNORMAL LOW (ref 4.22–5.81)
RDW: 14.2 % (ref 11.5–15.5)
WBC: 9.9 10*3/uL (ref 4.0–10.5)

## 2015-01-31 LAB — PHOSPHORUS: Phosphorus: 4.2 mg/dL (ref 2.5–4.6)

## 2015-01-31 LAB — MAGNESIUM: Magnesium: 2.6 mg/dL — ABNORMAL HIGH (ref 1.7–2.4)

## 2015-01-31 MED ORDER — POTASSIUM CHLORIDE 10 MEQ/50ML IV SOLN
10.0000 meq | INTRAVENOUS | Status: DC | PRN
  Administered 2015-01-31 – 2015-02-02 (×2): 10 meq via INTRAVENOUS
  Filled 2015-01-31 (×4): qty 50

## 2015-01-31 MED ORDER — LIDOCAINE HCL (PF) 1 % IJ SOLN
INTRAMUSCULAR | Status: AC
  Administered 2015-01-31: 5 mL
  Filled 2015-01-31: qty 5

## 2015-01-31 MED ORDER — POTASSIUM CHLORIDE 10 MEQ/50ML IV SOLN
10.0000 meq | INTRAVENOUS | Status: AC
  Administered 2015-01-31 (×3): 10 meq via INTRAVENOUS
  Filled 2015-01-31: qty 50

## 2015-01-31 MED ORDER — LIDOCAINE HCL (CARDIAC) 20 MG/ML IV SOLN
INTRAVENOUS | Status: AC
  Filled 2015-01-31: qty 5

## 2015-01-31 MED ORDER — TRACE MINERALS CR-CU-MN-SE-ZN 10-1000-500-60 MCG/ML IV SOLN
INTRAVENOUS | Status: AC
  Administered 2015-01-31: 18:00:00 via INTRAVENOUS
  Filled 2015-01-31: qty 1992

## 2015-01-31 NOTE — Progress Notes (Signed)
PARENTERAL NUTRITION CONSULT NOTE - INITIAL  Pharmacy Consult for TPN Indication: MD - increased risk for aspiration due to BiPAP   No Known Allergies  Patient Measurements: Height: '5\' 8"'$  (172.7 cm) Weight: 203 lb 11.3 oz (92.4 kg) IBW/kg (Calculated) : 68.4  Vital Signs: Temp: 99.2 F (37.3 C) (08/24 0400) Temp Source: Oral (08/24 0400) BP: 108/57 mmHg (08/24 0600) Pulse Rate: 56 (08/24 0600) Intake/Output from previous day: 08/23 0701 - 08/24 0700 In: 4063 [I.V.:1420.5; NG/GT:90; IV Piggyback:1012.5; TPN:1540] Out: 2725 [Urine:2525; Emesis/NG output:200]  Labs:  Recent Labs  01/29/15 0338 01/30/15 0518 01/31/15 0403  WBC 7.2 10.0 9.9  HGB 10.3* 11.2* 10.6*  HCT 30.4* 33.5* 32.4*  PLT 171 252 236    Recent Labs  01/29/15 0745 01/30/15 0518 01/31/15 0403  NA 138 139 141  K 4.0 3.7 3.6  CL 103 99* 99*  CO2 27 31 33*  GLUCOSE 146* 182* 148*  BUN 24* 25* 32*  CREATININE 0.99 1.29* 1.27*  CALCIUM 8.7* 8.8* 8.5*  MG 2.3  --  2.6*  PHOS 2.4*  --  4.2  PROT 5.4*  --   --   ALBUMIN 2.6*  --   --   AST 16  --   --   ALT 16*  --   --   ALKPHOS 46  --   --   BILITOT 0.8  --   --   PREALBUMIN 7.1*  --   --    Estimated Creatinine Clearance: 58 mL/min (by C-G formula based on Cr of 1.27).   Recent Labs  01/30/15 1917 01/30/15 2338 01/31/15 0359  GLUCAP 136* 153* 140*   Medical History: Past Medical History  Diagnosis Date  . CAD (coronary artery disease)     s/p inferior wall infarct in 10/01. has stent in Rt coronary artery. is due a stress myoview. does have some dyspnea on exertion  . SOB (shortness of breath) on exertion   . 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 Chest pain   . HLD (hyperlipidemia)     isdue followup lipids. on zocor 10 mg/day   . HTN (hypertension)   . Allergy     seasonal  . Cataract     removed  . Reflux esophagitis   . OSA (obstructive sleep apnea) 01/13/2015  . Erectile dysfunction   . Arthritis     all  over- in general   . Cancer     skin, melanoma  . GERD (gastroesophageal reflux disease)     pt. denies    Insulin Requirements in the past 24 hours:  15 units of regular insulin in TPN 12 units of SSI  Current Nutrition:  NPO Clinimix E 5/15 at 397m/hr (This provides 84g of protein and 1193 kcal meeting 58% of protein needs and 100% of kcal needs)  Nutritional Goals: Per RD 8/23 Kcal: [address]Protein: 140-150 g Fluid: per MD  Assessment: 74yo with CAD and s/p CABG, now with some post-op complications including delerium/DT's, Afib, and resp. compromise requiring the use of BiPAP.  He is felt to be at high risk for aspiration by his surgeon and he is to be started on TPN for nutritional support. No pertinent PMH for GI issues except a history of GERD.    Endo: No hx of DM. CBGs controlled 130-150s. SSI  Renal:  SCr up to 1.29, CrCl ~579mmin. UOP at 1.97m56mg/hr with lasix.  CAD:  NSR (out of Afib) BP ok and  HR brady. Amio on board, lopressor, furosemide  Hepatic:  Normal AST/ALT, Tbili 0.8. Albumin low at 2.6, Prealbumin 7.1.  GI: Hx. GERD - IV PPI on board.  He has functioning gut, and transitioning to TF's should be fine once risk of BiPAP issues resolved.  Lytes:  WNL, K 3.6 (replaced) Phos low at 4.2 and Mg 2.6 CoCa 9.4.  Neuro:  DT's - Precdex, haldol and sedation on board - may be at risk for re-feeding syndrome   ID: Day # 2 of abx for GNR bacteremia. Low grade fever of 100.6, now afebrile. WBC wnl  8/22 Blood cx > 1/2 GNRs  8/23 Zosyn >>  Fluids:  0.9% NS infusion at 43m/hr  Access:  Picc line - 8/20  TPN start:  8/21  Plan:  Increase Clinimix E 5/15 to 866mhr (Will be unable to meet patient protein / kcal needs with Clinimix) Will not add IVFE for 7 days in ICU patient per ASPEN guidelines (Day #4) Increase regular insulin to 20 units in TPN Continue current SSI Continue MVI and trace elements in TPN Give an extra 1058mof K runs x 3 F/U TPN  labs  Recommend trial of post-pyloric feeding and to wean TPN  NatElenor QuinonesharmD Clinical Pharmacist Pager [phone] 7:10 AM

## 2015-01-31 NOTE — Progress Notes (Signed)
PULMONARY / CRITICAL CARE MEDICINE   Name: Randall Ali. MRN: [id number] DOB: 11-26-1940    ADMISSION DATE:  01/24/2015 CONSULTATION DATE:  01/29/2015  REFERRING MD :  Dr. Servando Snare  CHIEF COMPLAINT:  Dyspnea  INITIAL PRESENTATION: 74 year old male with recent dx of 3 vessel CAD was admitted for CABG 8/17. The procedure was without complication. Hospital course has been complicated by post-op AF, severe delirium requiring precedex, and dyspnea requiring BiPAP for hypoxia. He has not been able to come off BiPAP, CXR concerning for edema. PCCM asked to evaluate. Blood CX showing GNR  STUDIES:  8/11 cardiac cath > Severe ostial LAD disease, moderate to severe OM1 and OM 2 as well as RCA disease. Referred for CABG Ct chest 8/16 > Stable non-calcified RUL nodule. No acute findings.  Echo 8/16 > LVEF 55-60%, Grade 1 DD, trace MR, TR PFTs 8/15 nml   SIGNIFICANT EVENTS: 8/17 > CABG 8/20 > dyspnea requiring BiPAP 8/22 Dyspnea worsening, PCCM consult, intubated 8/23 am    SUBJECTIVE:  febrile Good UO  Sedated on precedex gtt, some breath stacking, wound being cleaned  VITAL SIGNS: Temp:  [97.8 F (36.6 C)-102.7 F (39.3 C)] 98.6 F (37 C) (08/24 0742) Pulse Rate:  [56-161] 56 (08/24 0800) Resp:  [7-34] 20 (08/24 0800) BP: (95-164)/(49-82) 108/55 mmHg (08/24 0800) SpO2:  [84 %-100 %] 100 % (08/24 0800) FiO2 (%):  [50 %] 50 % (08/24 0325) Weight:  [203 lb 11.3 oz (92.4 kg)] 203 lb 11.3 oz (92.4 kg) (08/24 0500) HEMODYNAMICS:   VENTILATOR SETTINGS: Vent Mode:  [-] PRVC FiO2 (%):  [50 %] 50 % Set Rate:  [14 bmp] 14 bmp Vt Set:  [550 mL] 550 mL PEEP:  [5 cmH20] 5 cmH20 Plateau Pressure:  [14 cmH20-21 cmH20] 18 cmH20 INTAKE / OUTPUT:  Intake/Output Summary (Last 24 hours) at 01/31/15 0849 Last data filed at 01/31/15 0800  Gross per 24 hour  Intake 3799.84 ml  Output   2750 ml  Net 1049.84 ml    PHYSICAL EXAMINATION: General:  Acutely ill appearing male ,sedated on  precedex gtt Neuro:  RASS-2, follows commands HEENT:  [place]/AT, no JVD noted, PERRL Cardiovascular:  Open sternal wound, sinus. No MRG Lungs:  Diminished R > L. Abdomen:  Soft, non-tender, non-distended Musculoskeletal:  No acute deformity of ROM limitation Skin:  Grossly intact.   LABS:  CBC  Recent Labs Lab 01/29/15 0338 01/30/15 0518 01/31/15 0403  WBC 7.2 10.0 9.9  HGB 10.3* 11.2* 10.6*  HCT 30.4* 33.5* 32.4*  PLT 171 252 236   Coag's  Recent Labs Lab 01/24/15 1436  APTT 36  INR 1.34   BMET  Recent Labs Lab 01/29/15 0745 01/30/15 0518 01/31/15 0403  NA 138 139 141  K 4.0 3.7 3.6  CL 103 99* 99*  CO2 27 31 33*  BUN 24* 25* 32*  CREATININE 0.99 1.29* 1.27*  GLUCOSE 146* 182* 148*   Electrolytes  Recent Labs Lab 01/27/15 0359  01/29/15 0745 01/30/15 0518 01/31/15 0403  CALCIUM 8.3*  < > 8.7* 8.8* 8.5*  MG 2.1  --  2.3  --  2.6*  PHOS  --   --  2.4*  --  4.2  < > = values in this interval not displayed. Sepsis Markers  Recent Labs Lab 01/29/15 1330  LATICACIDVEN 1.0   ABG  Recent Labs Lab 01/29/15 0340 01/30/15 0044 01/30/15 0523  PHART 7.518* 7.508* 7.513*  PCO2ART 34.7* 38.4 37.8  PO2ART 63.0* 67.0*  169*   Liver Enzymes  Recent Labs Lab 01/26/15 1015 01/29/15 0745  AST 30 16  ALT 16* 16*  ALKPHOS 46 46  BILITOT 0.8 0.8  ALBUMIN 3.1* 2.6*   Cardiac Enzymes No results for input(s): TROPONINI, PROBNP in the last 168 hours. Glucose  Recent Labs Lab 01/30/15 1155 01/30/15 1650 01/30/15 1917 01/30/15 2338 01/31/15 0359 01/31/15 0739  GLUCAP 130* 159* 136* 153* 140* 149*   I/O last 3 completed shifts: In: 5728.5 [I.V.:1966; NG/GT:120; IV Piggyback:1262.5] Out: 9485 [Urine:4950; Emesis/NG output:200] Total I/O In: -  Out: 225 [Urine:225]    Imaging Dg Chest Port 1 View  01/31/2015   CLINICAL DATA:  Status post coronary bypass grafting  EXAM: PORTABLE CHEST - 1 VIEW  COMPARISON:  01/30/2015  FINDINGS: Endotracheal  tube, nasogastric catheter and right-sided PICC line are again identified and stable in appearance. Cardiac shadow is within normal limits. Postsurgical changes are again seen. Increased density is noted over the bases bilaterally again related to small pleural effusions. No focal confluent infiltrate is seen although the left lung base is not completely evaluated on this study.  IMPRESSION: Small pleural effusion.  No other focal abnormality is seen.  Tubes and lines as described.   Electronically Signed   By: Inez Catalina M.D.   On: 01/31/2015 07:42     ASSESSMENT / PLAN:  PULMONARY A: ETT 8/23 >> Acute hypoxic respiratory failure Pulmonary Edema Low suspicion amiodarone lung tox Mod-severe OSA by sleep study 7/28  P:   SBTs CPAP once extubated(Titration study needed as outpt)  CARDIOVASCULAR A:  3 vessel CAD s/p CABG 8/17 Post operative Afib HTN RUE PICC 8/20 >> P:  Management per TCTS On Amio for AF Scheduled and PRN metoprolol   RENAL A:   Metabolic alkalosis ?diuresis related  P:  Replete lytes as needed  GASTROINTESTINAL A:   Nutrition  P:   TPN per primary Can we use gut ?  HEMATOLOGIC A:   VTE prophylaxis  P:  SQ lovenox  INFECTIOUS A:   GNR sepsis ? source bld 8/22 >> GNR resp 8/23 >> Wound 8/23 >> P:   8/23 zosyn >> 8/23 vanc >>  Wound mx per TCTs  ENDOCRINE A:   Hyperglycemia with no DM history  P:   SSI   NEUROLOGIC A:   Acute metabolic encephalopathy (improving) ? ETOH withdrawal/DTs  P:   RASS goal: 0 Precedex gtt per ICU CIWA protocol PRN Ativan/Haldol Monitor QTc on tele   FAMILY  - Updates:   - Inter-disciplinary family meet or Palliative Care meeting due by:  8/24    TODAY'S SUMMARY: Severe delerium ? DTs with GNR sepsis from sternal wound most likely, s/p CABG   The patient is critically ill with multiple organ systems failure and requires high complexity decision making for assessment and support, frequent  evaluation and titration of therapies, application of advanced monitoring technologies and extensive interpretation of multiple databases. Critical Care Time devoted to patient care services described in this note independent of APP time is 31 minutes.    Kara Mead MD. [facility]. [facility] Pager [phone] If no response call [phone]    01/31/2015 8:49 AM

## 2015-01-31 NOTE — Care Management Important Message (Signed)
Important Message  Patient Details  Name: Randall Ali. MRN: [id number] Date of Birth: 06-Dec-1940   Medicare Important Message Given:  [facility] notification given    Nathen May 01/31/2015, 11:45 AMImportant Message  Patient Details  Name: Randall Ali. MRN: [id number] Date of Birth: 1941-03-05   Medicare Important Message Given:  Yes-second notification given    Nathen May 01/31/2015, 11:45 AM

## 2015-01-31 NOTE — Progress Notes (Signed)
PT Cancellation Note  Patient Details Name: Randall Ali. MRN: [id number] DOB: Aug 17, 1940   Cancelled Treatment:    Reason Eval/Treat Not Completed: Medical issues which prohibited therapy. Will continue to follow.   Rolinda Roan 01/31/2015, 3:20 PM   Rolinda Roan, PT, DPT Acute Rehabilitation Services Pager: [phone]

## 2015-01-31 NOTE — Progress Notes (Signed)
Patient ID: Randall Ali., male   DOB: 11/16/1940, 74 y.o.   MRN: [id number] TCTS DAILY ICU PROGRESS NOTE                   [address]            [place] [address]          [phone]   7 Days Post-Op Procedure(s) (LRB): CORONARY ARTERY BYPASS GRAFTING x 4 (LIMA-LAD, SVG-Int 1- Int 2, SVG-PD) ENDOSCOPIC GREATER SAPHENOUS VEIN HARVEST LEFT LEG (N/A) TRANSESOPHAGEAL ECHOCARDIOGRAM (TEE) (N/A)  Total Length of Stay:  LOS: 7 days   Subjective: Remains sedated on vent   Objective: Vital signs in last 24 hours: Temp:  [97.8 F (36.6 C)-102.7 F (39.3 C)] 98.6 F (37 C) (08/24 0742) Pulse Rate:  [56-161] 57 (08/24 0700) Cardiac Rhythm:  [-] Normal sinus rhythm (08/23 2108) Resp:  [7-34] 19 (08/24 0700) BP: (95-164)/(49-82) 109/57 mmHg (08/24 0700) SpO2:  [84 %-100 %] 100 % (08/24 0700) FiO2 (%):  [50 %-60 %] 50 % (08/24 0325) Weight:  [203 lb 11.3 oz (92.4 kg)] 203 lb 11.3 oz (92.4 kg) (08/24 0500)  Filed Weights   01/29/15 0400 01/30/15 0430 01/31/15 0500  Weight: 212 lb 1.3 oz (96.2 kg) 205 lb 0.4 oz (93 kg) 203 lb 11.3 oz (92.4 kg)    Weight change: -1 lb 5.2 oz (-0.6 kg)   Hemodynamic parameters for last 24 hours:    Intake/Output from previous day: 08/23 0701 - 08/24 0700 In: 4063 [I.V.:1420.5; NG/GT:90; IV Piggyback:1012.5; TPN:1540] Out: 2725 [Urine:2525; Emesis/NG output:200]  Intake/Output this shift:    Current Meds: Scheduled Meds: . antiseptic oral rinse  7 mL Mouth Rinse 6 times per day  . bisacodyl  10 mg Rectal Daily  . chlorhexidine  15 mL Mouth Rinse BID  . enoxaparin (LOVENOX) injection  30 mg Subcutaneous Q24H  . furosemide  40 mg Intravenous Daily  . insulin aspart  0-24 Units Subcutaneous 6 times per day  . levalbuterol  0.63 mg Nebulization Q6H  . metoprolol  5 mg Intravenous Q12H  . mupirocin ointment   Nasal BID  . pantoprazole sodium  40 mg Per Tube Q1200  . piperacillin-tazobactam (ZOSYN)  IV  3.375 g  Intravenous 3 times per day  . potassium chloride  10 mEq Intravenous Q1 Hr x 3  . sodium chloride  10-40 mL Intracatheter Q12H  . thiamine  100 mg Intravenous Daily  . vancomycin  750 mg Intravenous Q12H   Continuous Infusions: . Marland KitchenTPN (CLINIMIX-E) Adult 70 mL/hr at 01/31/15 0600  . Marland KitchenTPN (CLINIMIX-E) Adult    . sodium chloride 10 mL/hr at 01/31/15 0600  . amiodarone 30 mg/hr (01/31/15 0600)  . dexmedetomidine 1.002 mcg/kg/hr (01/31/15 0600)  . nitroGLYCERIN Stopped (01/30/15 0300)   PRN Meds:.Place/Maintain arterial line **AND** sodium chloride, fentaNYL (SUBLIMAZE) injection, haloperidol lactate, LORazepam, metoprolol, midazolam, midazolam, ondansetron (ZOFRAN) IV, potassium chloride, potassium chloride, sodium chloride, traMADol  General appearance: alert, cooperative and no distress Neurologic: sedated Heart: regular rate and rhythm Lungs: diminished breath sounds bibasilar Abdomen: soft, non-tender; bowel sounds normal; no masses,  no organomegaly Extremities: extremities normal, atraumatic, no cyanosis or edema and Homans sign is negative, no sign of DVT Wound: wound draining sternum stable  Lab Results: CBC: Recent Labs  01/30/15 0518 01/31/15 0403  WBC 10.0 9.9  HGB 11.2* 10.6*  HCT 33.5* 32.4*  PLT 252 236   BMET:  Recent Labs  01/30/15 0518 01/31/15  0403  NA 139 141  K 3.7 3.6  CL 99* 99*  CO2 31 33*  GLUCOSE 182* 148*  BUN 25* 32*  CREATININE 1.29* 1.27*  CALCIUM 8.8* 8.5*      Lab Results  Component Value Date   CREATININE 1.27* 01/31/2015   Estimated Creatinine Clearance: 58 mL/min (by C-G formula based on Cr of 1.27).    Results for orders placed or performed during the hospital encounter of 01/24/15  Culture, expectorated sputum-assessment     Status: None   Collection Time: 01/29/15  1:30 PM  Result Value Ref Range Status   Specimen Description SPUTUM  Final   Special Requests Normal  Final   Sputum evaluation   Final    MICROSCOPIC  FINDINGS SUGGEST THAT THIS SPECIMEN IS NOT REPRESENTATIVE OF LOWER RESPIRATORY SECRETIONS. PLEASE RECOLLECT. Results Called to: Reece Levy AT 5621 01/29/15 BY L BENFIELD    Report Status 01/29/2015 FINAL  Final  Culture, Urine     Status: None   Collection Time: 01/29/15  1:30 PM  Result Value Ref Range Status   Specimen Description URINE, RANDOM  Final   Special Requests Normal  Final   Culture NO GROWTH 1 DAY  Final   Report Status 01/30/2015 FINAL  Final  Culture, blood (routine x 2)     Status: None (Preliminary result)   Collection Time: 01/29/15  2:21 PM  Result Value Ref Range Status   Specimen Description BLOOD LEFT ANTECUBITAL  Final   Special Requests BOTTLES DRAWN AEROBIC AND ANAEROBIC 10CC  Final   Culture  Setup Time   Final    GRAM NEGATIVE RODS ANAEROBIC BOTTLE ONLY CRITICAL RESULT CALLED TO, READ BACK BY AND VERIFIED WITH: J. YATES,RN AT [phone] ON [id number] BY S. YARBROUGH    Culture NO GROWTH 1 DAY  Final   Report Status PENDING  Incomplete  Culture, blood (routine x 2)     Status: None (Preliminary result)   Collection Time: 01/29/15  2:30 PM  Result Value Ref Range Status   Specimen Description BLOOD LEFT ARM  Final   Special Requests BOTTLES DRAWN AEROBIC AND ANAEROBIC 5CC  Final   Culture  Setup Time   Final    GRAM NEGATIVE RODS GRAM POSITIVE COCCI IN BOTH AEROBIC AND ANAEROBIC BOTTLES CRITICAL RESULT CALLED TO, READ BACK BY AND VERIFIED WITH: WCOUNSELOR  6295 '@08'$ /23/16 MKELLY CRITICAL RESULT CALLED TO, READ BACK BY AND VERIFIED WITH: Thomasenia Bottoms, RN AT [phone] ON [id number] BY Rhea Bleacher NOTIFIED 2 ORGANISMS WERE PRESENT IN EACH BOTTLE    Culture NO GROWTH 1 DAY  Final   Report Status PENDING  Incomplete  Gram stain     Status: None   Collection Time: 01/30/15  7:54 AM  Result Value Ref Range Status   Specimen Description WOUND STERNUM  Final   Special Requests Normal  Final   Gram Stain   Final    FEW WBC PRESENT, PREDOMINANTLY PMN FEW GRAM POSITIVE COCCI IN  PAIRS RARE GRAM VARIABLE ROD    Report Status 01/30/2015 FINAL  Final     PT/INR: No results for input(s): LABPROT, INR in the last 72 hours. Radiology: Dg Chest Port 1 View  01/31/2015   CLINICAL DATA:  Status post coronary bypass grafting  EXAM: PORTABLE CHEST - 1 VIEW  COMPARISON:  01/30/2015  FINDINGS: Endotracheal tube, nasogastric catheter and right-sided PICC line are again identified and stable in appearance. Cardiac shadow is within normal limits. Postsurgical changes are again seen.  Increased density is noted over the bases bilaterally again related to small pleural effusions. No focal confluent infiltrate is seen although the left lung base is not completely evaluated on this study.  IMPRESSION: Small pleural effusion.  No other focal abnormality is seen.  Tubes and lines as described.   Electronically Signed   By: Inez Catalina M.D.   On: 01/31/2015 07:42     Assessment/Plan: S/P Procedure(s) (LRB): CORONARY ARTERY BYPASS GRAFTING x 4 (LIMA-LAD, SVG-Int 1- Int 2, SVG-PD) ENDOSCOPIC GREATER SAPHENOUS VEIN HARVEST LEFT LEG (N/A) TRANSESOPHAGEAL ECHOCARDIOGRAM (TEE) (N/A)  With patient sedated on ventilator  and with addition iv fentenyl. Sternal  wound draped and lower incision opened and re cultured with murky fluid from incision, upper part of incision is intact. Bone not exposed. VAC placed in wound.   Continue current Zosyn and Vanco waiting for id of cultures  Nutrition on tna currently transition to tube feeding next few days. Renal function stable Respiratory- discussed with CCM- keep intubated today  [name] 01/31/2015 7:55 AM

## 2015-01-31 NOTE — Progress Notes (Signed)
Patient ID: Randall Osbourne., male   DOB: 26-Mar-1941, 74 y.o.   MRN: [id number]  SICU Evening Rounds:  Hemodynamically stable in sinus rhythm  Remains on vent.  Diuresing well.

## 2015-02-01 ENCOUNTER — Inpatient Hospital Stay (HOSPITAL_COMMUNITY): Payer: PPO

## 2015-02-01 LAB — COMPREHENSIVE METABOLIC PANEL
ALT: 27 U/L (ref 17–63)
AST: 25 U/L (ref 15–41)
Albumin: 2.1 g/dL — ABNORMAL LOW (ref 3.5–5.0)
Alkaline Phosphatase: 56 U/L (ref 38–126)
Anion gap: 7 (ref 5–15)
BUN: 30 mg/dL — ABNORMAL HIGH (ref 6–20)
CO2: 32 mmol/L (ref 22–32)
Calcium: 8.4 mg/dL — ABNORMAL LOW (ref 8.9–10.3)
Chloride: 101 mmol/L (ref 101–111)
Creatinine, Ser: 0.99 mg/dL (ref 0.61–1.24)
GFR calc Af Amer: >60 mL/min (ref >60)
GFR calc non Af Amer: >60 mL/min (ref >60)
Glucose, Bld: 150 mg/dL — ABNORMAL HIGH (ref 65–99)
Potassium: 3.5 mmol/L (ref 3.5–5.1)
Sodium: 140 mmol/L (ref 135–145)
Total Bilirubin: 0.9 mg/dL (ref 0.3–1.2)
Total Protein: 5.3 g/dL — ABNORMAL LOW (ref 6.5–8.1)

## 2015-02-01 LAB — CBC
HCT: 31.4 % — ABNORMAL LOW (ref 39.0–52.0)
Hemoglobin: 10.4 g/dL — ABNORMAL LOW (ref 13.0–17.0)
MCH: 29.9 pg (ref 26.0–34.0)
MCHC: 33.1 g/dL (ref 30.0–36.0)
MCV: 90.2 fL (ref 78.0–100.0)
Platelets: 248 10*3/uL (ref 150–400)
RBC: 3.48 MIL/uL — ABNORMAL LOW (ref 4.22–5.81)
RDW: 14.3 % (ref 11.5–15.5)
WBC: 10.8 10*3/uL — ABNORMAL HIGH (ref 4.0–10.5)

## 2015-02-01 LAB — GLUCOSE, CAPILLARY
Glucose-Capillary: 115 mg/dL — ABNORMAL HIGH (ref 65–99)
Glucose-Capillary: 117 mg/dL — ABNORMAL HIGH (ref 65–99)
Glucose-Capillary: 140 mg/dL — ABNORMAL HIGH (ref 65–99)
Glucose-Capillary: 141 mg/dL — ABNORMAL HIGH (ref 65–99)
Glucose-Capillary: 144 mg/dL — ABNORMAL HIGH (ref 65–99)
Glucose-Capillary: 147 mg/dL — ABNORMAL HIGH (ref 65–99)

## 2015-02-01 LAB — CULTURE, RESPIRATORY W GRAM STAIN

## 2015-02-01 LAB — PHOSPHORUS: Phosphorus: 3.6 mg/dL (ref 2.5–4.6)

## 2015-02-01 LAB — CULTURE, RESPIRATORY

## 2015-02-01 LAB — CULTURE, BLOOD (ROUTINE X 2)

## 2015-02-01 LAB — MAGNESIUM: Magnesium: 2.3 mg/dL (ref 1.7–2.4)

## 2015-02-01 MED ORDER — TRACE MINERALS CR-CU-MN-SE-ZN 10-1000-500-60 MCG/ML IV SOLN
INTRAVENOUS | Status: AC
  Administered 2015-02-01: 17:00:00 via INTRAVENOUS
  Filled 2015-02-01: qty 1992

## 2015-02-01 MED ORDER — POTASSIUM CHLORIDE 10 MEQ/50ML IV SOLN
10.0000 meq | INTRAVENOUS | Status: AC
  Administered 2015-02-01 (×3): 10 meq via INTRAVENOUS
  Filled 2015-02-01 (×2): qty 50

## 2015-02-01 MED ORDER — FENTANYL CITRATE (PF) 100 MCG/2ML IJ SOLN
12.5000 ug | INTRAMUSCULAR | Status: DC | PRN
  Administered 2015-02-01 – 2015-02-05 (×4): 25 ug via INTRAVENOUS
  Filled 2015-02-01 (×4): qty 2

## 2015-02-01 MED ORDER — POTASSIUM CHLORIDE 10 MEQ/50ML IV SOLN
10.0000 meq | INTRAVENOUS | Status: AC
  Administered 2015-02-01 (×3): 10 meq via INTRAVENOUS
  Filled 2015-02-01 (×3): qty 50

## 2015-02-01 NOTE — Progress Notes (Signed)
Patient ID: [name]., male   DOB: May 05, 1941, 74 y.o.   MRN: [id number] EVENING ROUNDS NOTE :     [address]       [place] [address]             [phone]                 8 Days Post-Op Procedure(s) (LRB): CORONARY ARTERY BYPASS GRAFTING x 4 (LIMA-LAD, SVG-Int 1- Int 2, SVG-PD) ENDOSCOPIC GREATER SAPHENOUS VEIN HARVEST LEFT LEG (N/A) TRANSESOPHAGEAL ECHOCARDIOGRAM (TEE) (N/A)  Total Length of Stay:  LOS: 8 days  BP 142/78 mmHg  Pulse 91  Temp(Src) 98.6 F (37 C) (Oral)  Resp 25  Ht '5\' 8"'$  (1.727 m)  Wt 207 lb 0.2 oz (93.9 kg)  BMI 31.48 kg/m2  SpO2 95%  .Intake/Output      08/24 0701 - 08/25 0700 08/25 0701 - 08/26 0700   I.V. (mL/kg) 1035.6 (11) 238.4 (2.5)   NG/GT 120 30   IV Piggyback 662.5 425   TPN 1079 913   Total Intake(mL/kg) 2897.1 (30.9) 1606.4 (17.1)   Urine (mL/kg/hr) 3620 (1.6) 2255 (2.1)   Emesis/NG output 100 (0) 50 (0)   Drains 100 (0) 150 (0.1)   Stool     Total Output 3820 2455   Net -923 -848.6          . Marland KitchenTPN (CLINIMIX-E) Adult 83 mL/hr at 02/01/15 1712  . sodium chloride 10 mL/hr at 01/31/15 0600  . dexmedetomidine Stopped (02/01/15 1000)     Lab Results  Component Value Date   WBC 10.8* 02/01/2015   HGB 10.4* 02/01/2015   HCT 31.4* 02/01/2015   PLT 248 02/01/2015   GLUCOSE 150* 02/01/2015   CHOL 144 11/24/2014   TRIG 71.0 11/24/2014   HDL 46.90 11/24/2014   LDLDIRECT 78.0 04/05/2013   LDLCALC 83 11/24/2014   ALT 27 02/01/2015   AST 25 02/01/2015   NA 140 02/01/2015   K 3.5 02/01/2015   CL 101 02/01/2015   CREATININE 0.99 02/01/2015   BUN 30* 02/01/2015   CO2 32 02/01/2015   TSH 4.095 01/26/2015   PSA 0.28 11/24/2014   INR 1.34 01/24/2015   HGBA1C 5.8* 01/22/2015   Tolerating extubation, mental status improved, vac in place and functioning  [name] MD  Beeper [phone] Office [phone] 02/01/2015 6:41 PM

## 2015-02-01 NOTE — Significant Event (Signed)
Found patient on room air after being shaved again by NT. Saturations 94%. No complaints from patient. Will continue to monitor. Walker Paddack, Therapist, sports.

## 2015-02-01 NOTE — Progress Notes (Signed)
Patient ID: Randall Ali., male   DOB: 06-20-1940, 74 y.o.   MRN: [id number] TCTS DAILY ICU PROGRESS NOTE                   [address]            [place], [address]          [phone]   8 Days Post-Op Procedure(s) (LRB): CORONARY ARTERY BYPASS GRAFTING x 4 (LIMA-LAD, SVG-Int 1- Int 2, SVG-PD) ENDOSCOPIC GREATER SAPHENOUS VEIN HARVEST LEFT LEG (N/A) TRANSESOPHAGEAL ECHOCARDIOGRAM (TEE) (N/A)  Total Length of Stay:  LOS: 8 days   Subjective: Sedated on vent but cooperative, follows commands  Objective: Vital signs in last 24 hours: Temp:  [97.5 F (36.4 C)-98.9 F (37.2 C)] 97.7 F (36.5 C) (08/25 0735) Pulse Rate:  [51-79] 55 (08/25 0715) Cardiac Rhythm:  [-] Sinus bradycardia (08/25 0400) Resp:  [8-30] 21 (08/25 0715) BP: (99-144)/(45-76) 121/58 mmHg (08/25 0715) SpO2:  [94 %-100 %] 97 % (08/25 0715) FiO2 (%):  [40 %] 40 % (08/25 0715) Weight:  [207 lb 0.2 oz (93.9 kg)] 207 lb 0.2 oz (93.9 kg) (08/25 0403)  Filed Weights   01/30/15 0430 01/31/15 0500 02/01/15 0403  Weight: 205 lb 0.4 oz (93 kg) 203 lb 11.3 oz (92.4 kg) 207 lb 0.2 oz (93.9 kg)    Weight change: 3 lb 4.9 oz (1.5 kg)   Hemodynamic parameters for last 24 hours:    Intake/Output from previous day: 08/24 0701 - 08/25 0700 In: 2824.6 [I.V.:1025.6; NG/GT:120; IV Piggyback:600; TPN:1079] Out: 3820 [Urine:3620; Emesis/NG output:100; Drains:100]  Intake/Output this shift:    Current Meds: Scheduled Meds: . antiseptic oral rinse  7 mL Mouth Rinse 6 times per day  . bisacodyl  10 mg Rectal Daily  . chlorhexidine  15 mL Mouth Rinse BID  . enoxaparin (LOVENOX) injection  30 mg Subcutaneous Q24H  . furosemide  40 mg Intravenous Daily  . insulin aspart  0-24 Units Subcutaneous 6 times per day  . levalbuterol  0.63 mg Nebulization Q6H  . metoprolol  5 mg Intravenous Q12H  . mupirocin ointment   Nasal BID  . pantoprazole sodium  40 mg Per Tube Q1200  . piperacillin-tazobactam  (ZOSYN)  IV  3.375 g Intravenous 3 times per day  . potassium chloride  10 mEq Intravenous Q1 Hr x 3  . sodium chloride  10-40 mL Intracatheter Q12H  . thiamine  100 mg Intravenous Daily  . vancomycin  750 mg Intravenous Q12H   Continuous Infusions: . Marland KitchenTPN (CLINIMIX-E) Adult 83 mL/hr at 01/31/15 2000  . sodium chloride 10 mL/hr at 01/31/15 0600  . dexmedetomidine 0.9 mcg/kg/hr (02/01/15 0650)  . nitroGLYCERIN Stopped (01/30/15 0300)   PRN Meds:.Place/Maintain arterial line **AND** sodium chloride, fentaNYL (SUBLIMAZE) injection, haloperidol lactate, LORazepam, metoprolol, midazolam, midazolam, ondansetron (ZOFRAN) IV, potassium chloride, potassium chloride, sodium chloride, traMADol  General appearance: cooperative and no distress Neurologic: intact Heart: regular rate and rhythm, S1, S2 normal, no murmur, click, rub or gallop Lungs: diminished breath sounds bibasilar Abdomen: soft, non-tender; bowel sounds normal; no masses,  no organomegaly Extremities: extremities normal, atraumatic, no cyanosis or edema and Homans sign is negative, no sign of DVT Wound: vac in place and functioning  Lab Results: CBC: Recent Labs  01/31/15 0403 02/01/15 0353  WBC 9.9 10.8*  HGB 10.6* 10.4*  HCT 32.4* 31.4*  PLT 236 248   BMET:  Recent Labs  01/31/15 0403 02/01/15 0353  NA 141  140  K 3.6 3.5  CL 99* 101  CO2 33* 32  GLUCOSE 148* 150*  BUN 32* 30*  CREATININE 1.27* 0.99  CALCIUM 8.5* 8.4*    PT/INR: No results for input(s): LABPROT, INR in the last 72 hours. Radiology: Dg Chest Port 1 View  02/01/2015   CLINICAL DATA:  Respiratory failure.  EXAM: PORTABLE CHEST - 1 VIEW  COMPARISON:  01/31/2015.  FINDINGS: Endotracheal tube and right PICC line in stable position. NG tube noted with tip below left hemidiaphragm. Prior CABG. Heart size stable. Interim clearing of bibasilar pulmonary infiltrates. No significant pleural effusion. No pneumothorax.  IMPRESSION: 1. Endotracheal tube and  right PICC line stable position. NG tube tip below left hemidiaphragm. 2. Interim clearing of bibasilar infiltrates. 3. Prior CABG.  Heart size stable.  No pulmonary venous congestion.   Electronically Signed   By: Marcello Moores  Register   On: 02/01/2015 07:19   Results for orders placed or performed during the hospital encounter of 01/24/15  Culture, expectorated sputum-assessment     Status: None   Collection Time: 01/29/15  1:30 PM  Result Value Ref Range Status   Specimen Description SPUTUM  Final   Special Requests Normal  Final   Sputum evaluation   Final    MICROSCOPIC FINDINGS SUGGEST THAT THIS SPECIMEN IS NOT REPRESENTATIVE OF LOWER RESPIRATORY SECRETIONS. PLEASE RECOLLECT. Results Called to: Reece Levy AT 4709 01/29/15 BY L BENFIELD    Report Status 01/29/2015 FINAL  Final  Culture, Urine     Status: None   Collection Time: 01/29/15  1:30 PM  Result Value Ref Range Status   Specimen Description URINE, RANDOM  Final   Special Requests Normal  Final   Culture NO GROWTH 1 DAY  Final   Report Status 01/30/2015 FINAL  Final  Culture, blood (routine x 2)     Status: None (Preliminary result)   Collection Time: 01/29/15  2:21 PM  Result Value Ref Range Status   Specimen Description BLOOD LEFT ANTECUBITAL  Final   Special Requests BOTTLES DRAWN AEROBIC AND ANAEROBIC 10CC  Final   Culture  Setup Time   Final    GRAM NEGATIVE RODS ANAEROBIC BOTTLE ONLY CRITICAL RESULT CALLED TO, READ BACK BY AND VERIFIED WITH: J. Gibson Ramp AT 6283 ON [id number] BY Rhea Bleacher    Culture GRAM NEGATIVE RODS  Final   Report Status PENDING  Incomplete  Culture, blood (routine x 2)     Status: None (Preliminary result)   Collection Time: 01/29/15  2:30 PM  Result Value Ref Range Status   Specimen Description BLOOD LEFT ARM  Final   Special Requests BOTTLES DRAWN AEROBIC AND ANAEROBIC 5CC  Final   Culture  Setup Time   Final    GRAM NEGATIVE RODS GRAM POSITIVE COCCI IN BOTH AEROBIC AND ANAEROBIC  BOTTLES CRITICAL RESULT CALLED TO, READ BACK BY AND VERIFIED WITH: WCOUNSELOR  6546 '@08'$ /23/16 MKELLY CRITICAL RESULT CALLED TO, READ BACK BY AND VERIFIED WITH: Thomasenia Bottoms, RN AT [phone] ON [id number] BY Rhea Bleacher NOTIFIED 2 ORGANISMS WERE PRESENT IN EACH BOTTLE    Culture GRAM NEGATIVE RODS  Final   Report Status PENDING  Incomplete  Wound culture     Status: None (Preliminary result)   Collection Time: 01/30/15  7:51 AM  Result Value Ref Range Status   Specimen Description WOUND STERNUM  Final   Special Requests Normal  Final   Gram Stain   Final    NO WBC SEEN RARE SQUAMOUS EPITHELIAL  CELLS PRESENT NO ORGANISMS SEEN Performed at [facility]    Culture   Final    FEW GRAM NEGATIVE RODS Performed at [facility]    Report Status PENDING  Incomplete  Gram stain     Status: None   Collection Time: 01/30/15  7:54 AM  Result Value Ref Range Status   Specimen Description WOUND STERNUM  Final   Special Requests Normal  Final   Gram Stain   Final    FEW WBC PRESENT, PREDOMINANTLY PMN FEW GRAM POSITIVE COCCI IN PAIRS RARE GRAM VARIABLE ROD    Report Status 01/30/2015 FINAL  Final  Culture, respiratory (NON-Expectorated)     Status: None   Collection Time: 01/30/15 11:51 AM  Result Value Ref Range Status   Specimen Description TRACHEAL ASPIRATE  Final   Special Requests NONE  Final   Gram Stain   Final    RARE WBC PRESENT, PREDOMINANTLY PMN NO SQUAMOUS EPITHELIAL CELLS SEEN RARE GRAM NEGATIVE RODS Performed at [facility]    Culture   Final    Non-Pathogenic Oropharyngeal-type Flora Isolated. Performed at [facility]    Report Status 02/01/2015 FINAL  Final  Wound culture     Status: None (Preliminary result)   Collection Time: 01/31/15  8:35 AM  Result Value Ref Range Status   Specimen Description WOUND  Final   Special Requests STERNUM  Final   Gram Stain PENDING  Incomplete   Culture NO GROWTH Performed at [facility]   Final    Report Status PENDING  Incomplete   Cultures have provided no further ID of organisms or sensitivities to date.     Assessment/Plan: S/P Procedure(s) (LRB): CORONARY ARTERY BYPASS GRAFTING x 4 (LIMA-LAD, SVG-Int 1- Int 2, SVG-PD) ENDOSCOPIC GREATER SAPHENOUS VEIN HARVEST LEFT LEG (N/A) TRANSESOPHAGEAL ECHOCARDIOGRAM (TEE) (N/A) Mental status improved Renal function-normal Wean vent as tolerated Await culture results Wean extubated and tolerating convert to tube or oral feeding  [name] 02/01/2015 7:55 AM

## 2015-02-01 NOTE — Procedures (Signed)
Extubation Procedure Note  Patient Details:   Name: Randall Ali. DOB: 01-Oct-1940 MRN: [id number]   Airway Documentation:     Evaluation  O2 sats: stable throughout Complications: No apparent complications Patient did tolerate procedure well. Bilateral Breath Sounds: Rhonchi, Diminished Suctioning: Oral, Airway Yes   Pt extubated to 3L [place]. Pt awake and alert and able to speak his name at this time. Pt has weak productive cough and was encouraged to use [place] to get all secretions out. Pt has Rhonchi in upper lobes and diminished in lower lobes. RT will continue to monitor.   Jesse Sans 02/01/2015, 9:14 AM

## 2015-02-01 NOTE — Progress Notes (Signed)
Unspiked bottle of precedex dropped and busted in patient's room.  Witnessed by [employer].

## 2015-02-01 NOTE — Progress Notes (Signed)
PARENTERAL NUTRITION CONSULT NOTE - INITIAL  Pharmacy Consult for TPN Indication: MD - increased risk for aspiration due to BiPAP   No Known Allergies  Patient Measurements: Height: '5\' 8"'$  (172.7 cm) Weight: 207 lb 0.2 oz (93.9 kg) IBW/kg (Calculated) : 68.4  Vital Signs: Temp: 97.7 F (36.5 C) (08/25 0735) Temp Source: Axillary (08/25 0735) BP: 116/55 mmHg (08/25 0800) Pulse Rate: 58 (08/25 0800) Intake/Output from previous day: 08/24 0701 - 08/25 0700 In: 2897.1 [I.V.:1035.6; NG/GT:120; IV Piggyback:662.5; DPO:2423] Out: 5361 [Urine:3620; Emesis/NG output:100; Drains:100]  Labs:  Recent Labs  01/30/15 0518 01/31/15 0403 02/01/15 0353  WBC 10.0 9.9 10.8*  HGB 11.2* 10.6* 10.4*  HCT 33.5* 32.4* 31.4*  PLT 252 236 248    Recent Labs  01/30/15 0518 01/31/15 0403 02/01/15 0353  NA 139 141 140  K 3.7 3.6 3.5  CL 99* 99* 101  CO2 31 33* 32  GLUCOSE 182* 148* 150*  BUN 25* 32* 30*  CREATININE 1.29* 1.27* 0.99  CALCIUM 8.8* 8.5* 8.4*  MG  --  2.6* 2.3  PHOS  --  4.2 3.6  PROT  --   --  5.3*  ALBUMIN  --   --  2.1*  AST  --   --  25  ALT  --   --  27  ALKPHOS  --   --  56  BILITOT  --   --  0.9   Estimated Creatinine Clearance: 75 mL/min (by C-G formula based on Cr of 0.99).   Recent Labs  01/31/15 2355 02/01/15 0355 02/01/15 0733  GLUCAP 167* 140* 147*   Medical History: Past Medical History  Diagnosis Date  . CAD (coronary artery disease)     s/p inferior wall infarct in 10/01. has stent in Rt coronary artery. is due a stress myoview. does have some dyspnea on exertion  . SOB (shortness of breath) on exertion   . 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 Chest pain   . HLD (hyperlipidemia)     isdue followup lipids. on zocor 10 mg/day   . HTN (hypertension)   . Allergy     seasonal  . Cataract     removed  . Reflux esophagitis   . OSA (obstructive sleep apnea) 01/13/2015  . Erectile dysfunction   . Arthritis     all over- in  general   . Cancer     skin, melanoma  . GERD (gastroesophageal reflux disease)     pt. denies    Insulin Requirements in the past 24 hours:  20 units of regular insulin in TPN 14 units of SSI  Current Nutrition:  NPO Clinimix E 5/15 at 32m/hr (This provides 100g of protein and 1414 kcal meeting 71% of protein needs and > 100% of kcal needs)  Nutritional Goals: Per RD 8/23 Kcal: 14431-5400Protein: 140-150 g Fluid: per MD  Assessment: 74yo with CAD and s/p CABG, now with some post-op complications including delerium/DT's, Afib, and resp. compromise requiring the use of BiPAP.  He is felt to be at high risk for aspiration by his surgeon and he is to be started on TPN for nutritional support. No pertinent PMH for GI issues except a history of GERD.    Endo: No hx of DM. CBGs controlled 130-180s. SSI  Renal:  SCr back down to 0.99, CrCl ~780mmin. UOP at 1.32m40mg/hr with IV lasix.  CAD:  NSR (out of Afib) BP ok and HR brady. Amio on board,  lopressor, furosemide  Hepatic:  LFTs wnl. Tbili 0.9. Albumin low at 2.1, Prealbumin 7.1.  GI: Hx. GERD - IV PPI on board.  He has functioning gut, and transitioning to TF's should be fine once risk of BiPAP issues resolved.  Lytes:  WNL, K 3.5 (replaced) Phos 3.6 and Mg 2.3 CoCa 9.8.  Neuro:  DT's - Precdex, haldol and sedation on board - may be at risk for re-feeding syndrome   ID: Day # 3 of abx for GNR bacteremia. Low grade fever of 100.6, now afebrile. WBC slightly elevated at 10.8  8/22 Blood cx > 2/2 GNRs, 1/2 GPC 8/23 Resp cx > Rare GNRs 8/23 Wound cx > Few GNRs  8/23 Zosyn >>  Fluids:  0.9% NS infusion at 43m/hr  Access:  Picc line - 8/20  TPN start:  8/21 >>  Plan:  Continue Clinimix E 5/15 at 851mhr (Will be unable to meet patient protein / kcal needs with Clinimix) Will not add IVFE for 7 days in ICU patient per ASPEN guidelines (Day #5) Add 20 units of regular insulin to TPN Continue current SSI Continue MVI and  trace elements in TPN Give extra 1013mof K x 3 runs F/U TPN labs  CVTS plans to convert to tube or oral feeding  NatElenor QuinonesharmD Clinical Pharmacist Pager [phone]/2016 8:45 AM

## 2015-02-01 NOTE — Progress Notes (Signed)
PULMONARY / CRITICAL CARE MEDICINE   Name: Randall Ali. MRN: [id number] DOB: Jan 22, 1941    ADMISSION DATE:  01/24/2015 CONSULTATION DATE:  01/29/2015  REFERRING MD :  Dr. Servando Snare  CHIEF COMPLAINT:  Dyspnea  INITIAL PRESENTATION: 74 year old male with recent dx of 3 vessel CAD was admitted for CABG 8/17. The procedure was without complication. Hospital course has been complicated by post-op AF, severe delirium requiring precedex, and dyspnea requiring BiPAP for hypoxia. He has not been able to come off BiPAP, CXR concerning for edema. PCCM asked to evaluate. Blood CX showing GNR  STUDIES:  8/11 cardiac cath > Severe ostial LAD disease, moderate to severe OM1 and OM 2 as well as RCA disease. Referred for CABG Ct chest 8/16 > Stable non-calcified RUL nodule. No acute findings.  Echo 8/16 > LVEF 55-60%, Grade 1 DD, trace MR, TR PFTs 8/15 nml   SIGNIFICANT EVENTS: 8/17 > CABG 8/20 > dyspnea requiring BiPAP 8/22 Dyspnea worsening, PCCM consult, intubated 8/23 am    SUBJECTIVE: Afebrile Good UO  Alert on precedex gtt  VITAL SIGNS: Temp:  [97.5 F (36.4 C)-98.9 F (37.2 C)] 97.7 F (36.5 C) (08/25 0735) Pulse Rate:  [51-79] 58 (08/25 0800) Resp:  [8-30] 19 (08/25 0800) BP: (99-144)/(45-76) 116/55 mmHg (08/25 0800) SpO2:  [94 %-100 %] 96 % (08/25 0800) FiO2 (%):  [40 %] 40 % (08/25 0800) Weight:  [207 lb 0.2 oz (93.9 kg)] 207 lb 0.2 oz (93.9 kg) (08/25 0403) HEMODYNAMICS:   VENTILATOR SETTINGS: Vent Mode:  [-] PRVC FiO2 (%):  [40 %] 40 % Set Rate:  [14 bmp] 14 bmp Vt Set:  [550 mL] 550 mL PEEP:  [5 cmH20] 5 cmH20 Pressure Support:  [8 cmH20] 8 cmH20 Plateau Pressure:  [11 cmH20-32 cmH20] 11 cmH20 INTAKE / OUTPUT:  Intake/Output Summary (Last 24 hours) at 02/01/15 0854 Last data filed at 02/01/15 0800  Gross per 24 hour  Intake 3013.41 ml  Output   3870 ml  Net -856.59 ml    PHYSICAL EXAMINATION: General:  Acutely ill appearing male ,awake on precedex  gtt Neuro:  RASS 0, follows commands HEENT:  [place]/AT, no JVD noted, PERRL Cardiovascular:  Open sternal wound, sinus. No MRG Lungs:  Diminished R > L. Abdomen:  Soft, non-tender, non-distended Musculoskeletal:  No acute deformity of ROM limitation Skin:  Grossly intact.   LABS:  CBC  Recent Labs Lab 01/30/15 0518 01/31/15 0403 02/01/15 0353  WBC 10.0 9.9 10.8*  HGB 11.2* 10.6* 10.4*  HCT 33.5* 32.4* 31.4*  PLT 252 236 248   Coag's No results for input(s): APTT, INR in the last 168 hours. BMET  Recent Labs Lab 01/30/15 0518 01/31/15 0403 02/01/15 0353  NA 139 141 140  K 3.7 3.6 3.5  CL 99* 99* 101  CO2 31 33* 32  BUN 25* 32* 30*  CREATININE 1.29* 1.27* 0.99  GLUCOSE 182* 148* 150*   Electrolytes  Recent Labs Lab 01/29/15 0745 01/30/15 0518 01/31/15 0403 02/01/15 0353  CALCIUM 8.7* 8.8* 8.5* 8.4*  MG 2.3  --  2.6* 2.3  PHOS 2.4*  --  4.2 3.6   Sepsis Markers  Recent Labs Lab 01/29/15 1330  LATICACIDVEN 1.0   ABG  Recent Labs Lab 01/29/15 0340 01/30/15 0044 01/30/15 0523  PHART 7.518* 7.508* 7.513*  PCO2ART 34.7* 38.4 37.8  PO2ART 63.0* 67.0* 169*   Liver Enzymes  Recent Labs Lab 01/26/15 1015 01/29/15 0745 02/01/15 0353  AST '30 16 25  '$ ALT  16* 16* 27  ALKPHOS 46 46 56  BILITOT 0.8 0.8 0.9  ALBUMIN 3.1* 2.6* 2.1*   Cardiac Enzymes No results for input(s): TROPONINI, PROBNP in the last 168 hours. Glucose  Recent Labs Lab 01/31/15 1537 01/31/15 1807 01/31/15 1923 01/31/15 2355 02/01/15 0355 02/01/15 0733  GLUCAP 151* 159* 185* 167* 140* 147*   I/O last 3 completed shifts: In: 4738.9 [I.V.:1764.9; NG/GT:150; IV Piggyback:975] Out: 0962 [EZMOQ:9476; Emesis/NG output:100; Drains:100] Total I/O In: 204.6 [I.V.:41.6; NG/GT:30; IV Piggyback:50; TPN:83] Out: 275 [Urine:175; Emesis/NG output:50; Drains:50]    Imaging Dg Chest Port 1 View  02/01/2015   CLINICAL DATA:  Respiratory failure.  EXAM: PORTABLE CHEST - 1 VIEW   COMPARISON:  01/31/2015.  FINDINGS: Endotracheal tube and right PICC line in stable position. NG tube noted with tip below left hemidiaphragm. Prior CABG. Heart size stable. Interim clearing of bibasilar pulmonary infiltrates. No significant pleural effusion. No pneumothorax.  IMPRESSION: 1. Endotracheal tube and right PICC line stable position. NG tube tip below left hemidiaphragm. 2. Interim clearing of bibasilar infiltrates. 3. Prior CABG.  Heart size stable.  No pulmonary venous congestion.   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 source bld 8/22 >> GNR resp 8/23 >>GNR >> Wound 8/23 >> P:   8/23 zosyn >> 8/23 vanc >>  Wound mx per TCTs  ENDOCRINE A:   Hyperglycemia with no DM history  P:   SSI   NEUROLOGIC A:   Acute metabolic encephalopathy (improving) ? ETOH withdrawal/DTs  P:   RASS goal: 0 Precedex gtt per ICU CIWA protocol PRN Ativan/Haldol Monitor QTc on tele   FAMILY  - Updates:   - Inter-disciplinary family meet or Palliative Care meeting due by:  NA    TODAY'S SUMMARY: Severe delerium ? DTs with GNR sepsis from sternal wound most likely, s/p CABG -hope to extubate & progress today   The patient is critically ill with multiple organ systems  failure and requires high complexity decision making for assessment and support, frequent evaluation and titration of therapies, application of advanced monitoring technologies and extensive interpretation of multiple databases. Critical Care Time devoted to patient care services described in this note independent of APP time is 31 minutes.    Kara Mead MD. [place] Flood. [facility] Pager [phone] If no response call [phone]    02/01/2015 8:54 AM

## 2015-02-01 NOTE — Evaluation (Signed)
Physical Therapy Evaluation Patient Details Name: Randall Ali. MRN: [id number] DOB: 08-30-1940 Today's Date: 02/01/2015   History of Present Illness  Pt is a 74 y/o male with a recent diagnosis or 3 vessel CAD who was admitted for CABG 8/17. The procedure was without complication but his hospital couse was complicated by post-op AF, severe delerium, and dyspnea requiring BiPAP and intubation x2. Pt was extubated 8/25.  Clinical Impression  Pt admitted with above diagnosis. Pt currently with functional limitations due to the deficits listed below (see PT Problem List). At the time of PT eval pt was able to perform transfers with +2 assist mainly for lines and safety. Pt motivated to walk however requires assist for balance and support at this time. Pt will benefit from skilled PT to increase their independence and safety with mobility to allow discharge to the venue listed below.       Follow Up Recommendations Home health PT;Supervision/Assistance - 24 hour    Equipment Recommendations  Rolling walker with 5" wheels;3in1 (PT)    Recommendations for Other Services       Precautions / Restrictions Precautions Precautions: Fall Precaution Comments: Wound vac placed Restrictions Weight Bearing Restrictions: Yes (Sternal precautions)      Mobility  Bed Mobility Overal bed mobility: Needs Assistance Bed Mobility: Rolling;Sidelying to Sit Rolling: Min assist Sidelying to sit: Mod assist       General bed mobility comments: Assist to achieve full sidelying position and to elevate trunk into full sitting. Pt held to heart pillow and maintained sternal precautions well.   Transfers Overall transfer level: Needs assistance Equipment used: 2 person hand held assist Transfers: Sit to/from [facility] Sit to Stand: Min assist;+2 safety/equipment Stand pivot transfers: Min assist;+2 safety/equipment       General transfer comment: Steadying assist as pt  powers up to full standing position. Pt was able to take a few pivotal steps around to the recliner chair.   Ambulation/Gait                Stairs            Wheelchair Mobility    Modified Rankin (Stroke Patients Only)       Balance                                             Pertinent Vitals/Pain Pain Assessment: No/denies pain    Home Living Family/patient expects to be discharged to:: Private residence Living Arrangements: Alone Available Help at Discharge: Family;Available 24 hours/day Type of Home: House Home Access: Level entry     Home Layout: One level Home Equipment: Crutches      Prior Function Level of Independence: Independent         Comments: Works full time     Journalist, newspaper   Dominant Hand: Right    Extremity/Trunk Assessment   Upper Extremity Assessment: Defer to OT evaluation           Lower Extremity Assessment: Generalized weakness      Cervical / Trunk Assessment: Normal  Communication   Communication: No difficulties  Cognition Arousal/Alertness: Awake/alert Behavior During Therapy: WFL for tasks assessed/performed Overall Cognitive Status: Within Functional Limits for tasks assessed                      General Comments  Exercises        Assessment/Plan    PT Assessment Patient needs continued PT services  PT Diagnosis Difficulty walking;Generalized weakness   PT Problem List Decreased strength;Decreased range of motion;Decreased activity tolerance;Decreased balance;Decreased mobility;Decreased knowledge of use of DME;Decreased safety awareness;Decreased knowledge of precautions;Cardiopulmonary status limiting activity  PT Treatment Interventions Gait training;DME instruction;Stair training;Functional mobility training;Therapeutic activities;Therapeutic exercise;Neuromuscular re-education;Patient/family education   PT Goals (Current goals can be found in the Care Plan  section) Acute Rehab PT Goals Patient Stated Goal: Walk more PT Goal Formulation: With patient Time For Goal Achievement: 02/15/15 Potential to Achieve Goals: Good    Frequency Min 3X/week   Barriers to discharge        Co-evaluation               End of Session Equipment Utilized During Treatment:  (Unable to use gait belt due to wound vac and sternal incisio) Activity Tolerance: Patient tolerated treatment well Patient left: in chair;with call bell/phone within reach;with chair alarm set Nurse Communication: Mobility status         Time: 7062-3762 PT Time Calculation (min) (ACUTE ONLY): 22 min   Charges:   PT Evaluation $Initial PT Evaluation Tier I: 1 Procedure     PT G CodesRolinda Roan 2015-02-17, 3:03 PM   Rolinda Roan, PT, DPT Acute Rehabilitation Services Pager: [phone]

## 2015-02-02 ENCOUNTER — Inpatient Hospital Stay (HOSPITAL_COMMUNITY): Payer: PPO

## 2015-02-02 DIAGNOSIS — T8149XA Infection following a procedure, other surgical site, initial encounter: Secondary | ICD-10-CM

## 2015-02-02 DIAGNOSIS — A498 Other bacterial infections of unspecified site: Secondary | ICD-10-CM | POA: Diagnosis present

## 2015-02-02 DIAGNOSIS — T814XXA Infection following a procedure, initial encounter: Secondary | ICD-10-CM

## 2015-02-02 DIAGNOSIS — Z9889 Other specified postprocedural states: Secondary | ICD-10-CM

## 2015-02-02 DIAGNOSIS — Y839 Surgical procedure, unspecified as the cause of abnormal reaction of the patient, or of later complication, without mention of misadventure at the time of the procedure: Secondary | ICD-10-CM

## 2015-02-02 DIAGNOSIS — B9689 Other specified bacterial agents as the cause of diseases classified elsewhere: Secondary | ICD-10-CM

## 2015-02-02 HISTORY — DX: Infection following a procedure, other surgical site, initial encounter: T81.49XA

## 2015-02-02 LAB — GLUCOSE, CAPILLARY
Glucose-Capillary: 141 mg/dL — ABNORMAL HIGH (ref 65–99)
Glucose-Capillary: 140 mg/dL — ABNORMAL HIGH (ref 65–99)
Glucose-Capillary: 164 mg/dL — ABNORMAL HIGH (ref 65–99)
Glucose-Capillary: 145 mg/dL — ABNORMAL HIGH (ref 65–99)
Glucose-Capillary: 255 mg/dL — ABNORMAL HIGH (ref 65–99)

## 2015-02-02 LAB — CBC
HCT: 37.1 % — ABNORMAL LOW (ref 39.0–52.0)
Hemoglobin: 12.5 g/dL — ABNORMAL LOW (ref 13.0–17.0)
MCH: 30.4 pg (ref 26.0–34.0)
MCHC: 33.7 g/dL (ref 30.0–36.0)
MCV: 90.3 fL (ref 78.0–100.0)
Platelets: 395 10*3/uL (ref 150–400)
RBC: 4.11 MIL/uL — ABNORMAL LOW (ref 4.22–5.81)
RDW: 14.2 % (ref 11.5–15.5)
WBC: 16.3 10*3/uL — ABNORMAL HIGH (ref 4.0–10.5)

## 2015-02-02 LAB — CULTURE, BLOOD (ROUTINE X 2)

## 2015-02-02 LAB — COMPREHENSIVE METABOLIC PANEL
ALT: 63 U/L (ref 17–63)
AST: 50 U/L — ABNORMAL HIGH (ref 15–41)
Albumin: 2.4 g/dL — ABNORMAL LOW (ref 3.5–5.0)
Alkaline Phosphatase: 94 U/L (ref 38–126)
Anion gap: 10 (ref 5–15)
BUN: 25 mg/dL — ABNORMAL HIGH (ref 6–20)
CO2: 28 mmol/L (ref 22–32)
Calcium: 8.8 mg/dL — ABNORMAL LOW (ref 8.9–10.3)
Chloride: 102 mmol/L (ref 101–111)
Creatinine, Ser: 1.19 mg/dL (ref 0.61–1.24)
GFR calc Af Amer: >60 mL/min (ref >60)
GFR calc non Af Amer: 59 mL/min — ABNORMAL LOW (ref >60)
Glucose, Bld: 149 mg/dL — ABNORMAL HIGH (ref 65–99)
Potassium: 3.4 mmol/L — ABNORMAL LOW (ref 3.5–5.1)
Sodium: 140 mmol/L (ref 135–145)
Total Bilirubin: 0.8 mg/dL (ref 0.3–1.2)
Total Protein: 6.1 g/dL — ABNORMAL LOW (ref 6.5–8.1)

## 2015-02-02 LAB — TSH: TSH: 3.318 u[IU]/mL (ref 0.350–4.500)

## 2015-02-02 LAB — WOUND CULTURE
Gram Stain: NONE SEEN
Special Requests: NORMAL

## 2015-02-02 MED ORDER — RESOURCE THICKENUP CLEAR PO POWD
ORAL | Status: DC | PRN
  Filled 2015-02-02: qty 125

## 2015-02-02 MED ORDER — DILTIAZEM LOAD VIA INFUSION
10.0000 mg | Freq: Once | INTRAVENOUS | Status: AC
  Administered 2015-02-02: 10 mg via INTRAVENOUS
  Filled 2015-02-02: qty 10

## 2015-02-02 MED ORDER — AMIODARONE HCL 200 MG PO TABS: 200.0000 mg | ORAL_TABLET | Freq: Every day | ORAL | Status: DC

## 2015-02-02 MED ORDER — PANTOPRAZOLE SODIUM 40 MG IV SOLR
40.0000 mg | INTRAVENOUS | Status: DC
Start: 2015-02-02 — End: 2015-02-06
  Administered 2015-02-02 – 2015-02-06 (×5): 40 mg via INTRAVENOUS
  Filled 2015-02-02 (×7): qty 40

## 2015-02-02 MED ORDER — DEXTROSE 5 % IV SOLN
5.0000 mg/h | INTRAVENOUS | Status: DC
  Administered 2015-02-02: 5 mg/h via INTRAVENOUS
  Filled 2015-02-02: qty 100

## 2015-02-02 MED ORDER — AMIODARONE HCL 200 MG PO TABS
200.0000 mg | ORAL_TABLET | Freq: Two times a day (BID) | ORAL | Status: DC
Start: 2015-02-02 — End: 2015-02-04
  Administered 2015-02-02 – 2015-02-04 (×4): 200 mg via ORAL
  Filled 2015-02-02 (×6): qty 1

## 2015-02-02 MED ORDER — DEXTROSE 5 % IV SOLN
2.0000 g | INTRAVENOUS | Status: DC
  Administered 2015-02-02 – 2015-02-09 (×8): 2 g via INTRAVENOUS
  Filled 2015-02-02 (×8): qty 2

## 2015-02-02 MED ORDER — AMIODARONE LOAD VIA INFUSION
150.0000 mg | Freq: Once | INTRAVENOUS | Status: AC
  Administered 2015-02-02: 150 mg via INTRAVENOUS
  Filled 2015-02-02: qty 83.34

## 2015-02-02 MED ORDER — TRACE MINERALS CR-CU-MN-SE-ZN 10-1000-500-60 MCG/ML IV SOLN
INTRAVENOUS | Status: DC
  Filled 2015-02-02: qty 1992

## 2015-02-02 MED ORDER — POTASSIUM CHLORIDE 10 MEQ/50ML IV SOLN
10.0000 meq | INTRAVENOUS | Status: AC
  Administered 2015-02-02 (×3): 10 meq via INTRAVENOUS
  Filled 2015-02-02 (×3): qty 50

## 2015-02-02 MED ORDER — ZOLPIDEM TARTRATE 5 MG PO TABS
5.0000 mg | ORAL_TABLET | Freq: Once | ORAL | Status: AC
  Administered 2015-02-02: 5 mg via ORAL
  Filled 2015-02-02: qty 1

## 2015-02-02 MED ORDER — AMIODARONE HCL IN DEXTROSE 360-4.14 MG/200ML-% IV SOLN
30.0000 mg/h | INTRAVENOUS | Status: DC
  Administered 2015-02-02 – 2015-02-03 (×2): 30 mg/h via INTRAVENOUS
  Filled 2015-02-02 (×8): qty 200

## 2015-02-02 MED ORDER — AMIODARONE HCL IN DEXTROSE 360-4.14 MG/200ML-% IV SOLN
60.0000 mg/h | INTRAVENOUS | Status: AC
  Administered 2015-02-02 (×2): 60 mg/h via INTRAVENOUS
  Filled 2015-02-02 (×2): qty 200

## 2015-02-02 MED ORDER — OXYCODONE HCL 5 MG PO TABS
5.0000 mg | ORAL_TABLET | ORAL | Status: DC | PRN
  Administered 2015-02-09: 5 mg via ORAL
  Filled 2015-02-02: qty 1

## 2015-02-02 NOTE — Progress Notes (Signed)
PT Cancellation Note  Patient Details Name: Randall Ali. MRN: [id number] DOB: [birth year]   Cancelled Treatment:    Reason Eval/Treat Not Completed: Patient at procedure or test/unavailable.    Rolinda Roan 02/02/2015, 2:17 PM   Rolinda Roan, PT, DPT Acute Rehabilitation Services Pager: [phone]

## 2015-02-02 NOTE — Progress Notes (Signed)
TCTS BRIEF SICU PROGRESS NOTE  9 Days Post-Op  S/P Procedure(s) (LRB): CORONARY ARTERY BYPASS GRAFTING x 4 (LIMA-LAD, SVG-Int 1- Int 2, SVG-PD) ENDOSCOPIC GREATER SAPHENOUS VEIN HARVEST LEFT LEG (N/A) TRANSESOPHAGEAL ECHOCARDIOGRAM (TEE) (N/A)   Stable day  Plan: Continue current plan  Rexene Alberts 02/02/2015 8:04 PM

## 2015-02-02 NOTE — Progress Notes (Signed)
PULMONARY / CRITICAL CARE MEDICINE   Name: Randall Ali. MRN: [id number] DOB: Dec 16, 1940    ADMISSION DATE:  01/24/2015 CONSULTATION DATE:  01/29/2015  REFERRING MD :  Dr. Servando Snare  CHIEF COMPLAINT:  Dyspnea  INITIAL PRESENTATION: 74 year old male with recent dx of 3 vessel CAD was admitted for CABG 8/17. The procedure was without complication. Hospital course has been complicated by post-op AF, severe delirium requiring precedex, and dyspnea requiring BiPAP for hypoxia. He has not been able to come off BiPAP, CXR concerning for edema. PCCM asked to evaluate. Blood CX showing GNR  STUDIES:  8/11 cardiac cath > Severe ostial LAD disease, moderate to severe OM1 and OM 2 as well as RCA disease. Referred for CABG Ct chest 8/16 > Stable non-calcified RUL nodule. No acute findings.  Echo 8/16 > LVEF 55-60%, Grade 1 DD, trace MR, TR PFTs 8/15 nml   SIGNIFICANT EVENTS: 8/17 > CABG 8/20 > dyspnea requiring BiPAP 8/22 Dyspnea worsening, PCCM consult, intubated 8/23 am 8/25 extubated   SUBJECTIVE: Afebrile Good UO  No events overnight  VITAL SIGNS: Temp:  [97.9 F (36.6 C)-99.6 F (37.6 C)] 98.1 F (36.7 C) (08/26 0743) Pulse Rate:  [66-125] 102 (08/26 0700) Resp:  [16-35] 27 (08/26 0700) BP: (115-157)/(56-83) 134/70 mmHg (08/26 0600) SpO2:  [90 %-100 %] 99 % (08/26 0743) Weight:  [197 lb 12 oz (89.7 kg)] 197 lb 12 oz (89.7 kg) (08/26 0600) HEMODYNAMICS:   VENTILATOR SETTINGS:   INTAKE / OUTPUT:  Intake/Output Summary (Last 24 hours) at 02/02/15 0915 Last data filed at 02/02/15 0800  Gross per 24 hour  Intake 3343.23 ml  Output   2980 ml  Net 363.23 ml    PHYSICAL EXAMINATION: General:  Acutely ill appearing male, anxious Neuro:  RASS 0, interactive HEENT:  [place]/AT, no JVD noted, PERRL Cardiovascular:  Open sternal wound with avc, sinus. No MRG Lungs:  Diminished R > L. Abdomen:  Soft, non-tender, non-distended Musculoskeletal:  No acute deformity of ROM  limitation Skin:  Grossly intact.   LABS:  CBC  Recent Labs Lab 01/31/15 0403 02/01/15 0353 02/02/15 0739  WBC 9.9 10.8* 16.3*  HGB 10.6* 10.4* 12.5*  HCT 32.4* 31.4* 37.1*  PLT 236 248 395   Coag's No results for input(s): APTT, INR in the last 168 hours. BMET  Recent Labs Lab 01/31/15 0403 02/01/15 0353 02/02/15 0739  NA 141 140 140  K 3.6 3.5 3.4*  CL 99* 101 102  CO2 33* 32 28  BUN 32* 30* 25*  CREATININE 1.27* 0.99 1.19  GLUCOSE 148* 150* 149*   Electrolytes  Recent Labs Lab 01/29/15 0745  01/31/15 0403 02/01/15 0353 02/02/15 0739  CALCIUM 8.7*  < > 8.5* 8.4* 8.8*  MG 2.3  --  2.6* 2.3  --   PHOS 2.4*  --  4.2 3.6  --   < > = values in this interval not displayed. Sepsis Markers  Recent Labs Lab 01/29/15 1330  LATICACIDVEN 1.0   ABG  Recent Labs Lab 01/29/15 0340 01/30/15 0044 01/30/15 0523  PHART 7.518* 7.508* 7.513*  PCO2ART 34.7* 38.4 37.8  PO2ART 63.0* 67.0* 169*   Liver Enzymes  Recent Labs Lab 01/29/15 0745 02/01/15 0353 02/02/15 0739  AST 16 25 50*  ALT 16* 27 63  ALKPHOS 46 56 94  BILITOT 0.8 0.9 0.8  ALBUMIN 2.6* 2.1* 2.4*   Cardiac Enzymes No results for input(s): TROPONINI, PROBNP in the last 168 hours. Glucose  Recent Labs Lab  02/01/15 1208 02/01/15 1648 02/01/15 1921 02/01/15 2331 02/02/15 0343 02/02/15 0833  GLUCAP 115* 144* 117* 141* 141* 140*   I/O last 3 completed shifts: In: 5576.5 [I.V.:1578.5; NG/GT:60; IV Piggyback:950] Out: 3825 [Urine:4275; Emesis/NG output:50; Drains:200] Total I/O In: -  Out: 225 [Urine:175; Drains:50]    Imaging Dg Chest Port 1 View  02/02/2015   CLINICAL DATA:  CABG.  EXAM: PORTABLE CHEST - 1 VIEW  COMPARISON:  02/01/2015 .  FINDINGS: Right PICC line in stable position. Interim extubation and removal of NG tube. Prior CABG. Left lower lobe infiltrate consistent with pneumonia. Small left pleural effusion. No pneumothorax.  IMPRESSION: 1.  Right PICC line stable  position.  2. Left lower lobe infiltrate consistent with pneumonia. Small left pleural effusion.   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 Post operative Afib HTN RUE PICC 8/20 >> P:  Management per TCTS On Amio for AF Scheduled and PRN metoprolol   RENAL A:   Metabolic alkalosis ?diuresis related  P:  Replete lytes as needed  GASTROINTESTINAL A:   Nutrition  P:   TPN per primary Swallow eval & hope to start PO soon  HEMATOLOGIC A:   VTE prophylaxis  P:  SQ lovenox  INFECTIOUS A:   GNR sepsis -sternal wound infection bld 8/22 >> enterobacter resp 8/23 >>ng Wound 8/23 >>enterobacter P:   8/23 zosyn >>8/26 8/23 vanc >>8/26 ceftx 8/26 >>  Wound mx per TCTs ABx for 10-14 ds based on wound improvement  ENDOCRINE A:   Hyperglycemia with no DM history  P:   SSI   NEUROLOGIC A:   Acute metabolic encephalopathy (improving) ? ETOH withdrawal/DTs  P:   RASS goal: 0 PRN Ativan/Haldol Monitor QTc on tele   FAMILY  - Updates:   - Inter-disciplinary family meet or Palliative Care meeting due by:  NA    TODAY'S SUMMARY: Severe delerium ? DTs with GNR sepsis from sternal wound most likely, s/p CABG - progressing with PT & swallow eval  PCCM available as needed   Kara Mead MD. FCCP. [facility] Pager [phone] If no response call [phone]    02/02/2015 9:15 AM

## 2015-02-02 NOTE — Consult Note (Signed)
[facility]    Date of Admission:  01/24/2015   Total days of antibiotics 4        Day 2 ceftriaxone              Reason for Consult: Enterobacter bacteremia and sternal wound infection    Referring Physician: Dr. Ceasar Mons Primary Care Physician: Dr. Deborra Medina  Principal Problem:   Infection due to Enterobacter aerogenes Active Problems:   Postoperative wound infection   S/P CABG x 4   Status post right foot surgery   Acute respiratory failure with hypoxia   Delirium of mixed origin   . amiodarone  200 mg Oral Q12H   Followed by  . [START ON 02/10/2015] amiodarone  200 mg Oral Daily  . antiseptic oral rinse  7 mL Mouth Rinse 6 times per day  . bisacodyl  10 mg Rectal Daily  . cefTRIAXone (ROCEPHIN)  IV  2 g Intravenous Q24H  . chlorhexidine  15 mL Mouth Rinse BID  . enoxaparin (LOVENOX) injection  30 mg Subcutaneous Q24H  . furosemide  40 mg Intravenous Daily  . insulin aspart  0-24 Units Subcutaneous 6 times per day  . levalbuterol  0.63 mg Nebulization Q6H  . metoprolol  5 mg Intravenous Q12H  . mupirocin ointment   Nasal BID  . pantoprazole (PROTONIX) IV  40 mg Intravenous Q24H  . potassium chloride  10 mEq Intravenous Q1 Hr x 4  . sodium chloride  10-40 mL Intracatheter Q12H  . thiamine  100 mg Intravenous Daily    Recommendations: 1. Continue ceftriaxone 2. Plain x-rays of right foot   Assessment: Mr. Scholer developed a very early postoperative wound infection and Enterobacter bacteremia. The source is not entirely clear. I have some concern that he may have a persistent right foot infection that could have been a source. I'll repeat plain x-rays to see if there is any change suggesting deep infection. There have been some recent problems with temperature and humidity fluctuation in our operating rooms and that there was a recent recall of chlorhexidine wipes because of concerned about potential contamination with  Burkholderia cepacia but I cannot draw any direct link between these events and his infection. I have informed our infection prevention staff. I would plan on continuing antibiotics for 10-14 days total. I will continue ceftriaxone for now. Please call Dr. Talbot Grumbling [phone]) for any infectious disease questions this weekend.   HPI: Randall Ali. is a 74 y.o. male underwent elective coronary artery bypass grafting on 01/24/2015. He developed an episode of postoperative atrial fibrillation. He developed early postoperative delirium and began having fevers on 01/29/2015. He then began to have some drainage from the inferior portion of his sternal wound. Blood cultures and wound cultures grew Enterobacter aerogenes. He has improved on antibiotic therapy. His confusion has resolved and he has defervesced. The lower portion of his wound was opened 2 days ago. A VAC dressing is in place.  He underwent right foot surgery in May. He underwent repeat hardware fusion of his right great toe and had hammertoe repair of his right second toe. He developed a postoperative infection with persistent drainage from the second toe. A culture done at [facility] in late July grew MSSA. He had a brief course of doxycycline. The drainage stopped but he has continued to have redness and swelling of the second toe and sensation of swelling on  the ball of his right foot. He has continued to have severe pain when standing on his right foot.  An x-ray done on 12/27/2014 revealed:  "Findings/Impression: Redemonstrated fracture through the right second distal metatarsal. There is decreased lucency across the fracture margin compatible with interval healing with callus formation about the fracture. No definite new fracture or dislocation is noted.  Patchy osteopenia within the forefoot likely related to disuse. Status post second proximal interphalangeal joint arthrodesis and right great toe  MTP joint arthrodesis, grossly similar in appearance compared to the prior examination, with persistent lucency across the arthrodesis sites. No evidence of hardware failure or complication. Previous screw tracks are seen within the first metatarsal, unchanged."  He does family state that his right foot looks much improved since he has been in the hospital. The swelling and redness of his second toe has resolved.    Review of Systems: Constitutional: positive for fevers, negative for anorexia, chills, sweats and weight loss Eyes: negative Ears, nose, mouth, throat, and face: negative Respiratory: negative Cardiovascular: as noted in history of present illness Gastrointestinal: negative Genitourinary:negative Musculoskeletal:as noted in history of present illness  Past Medical History  Diagnosis Date  . CAD (coronary artery disease)     s/p inferior wall infarct in 10/01. has stent in Rt coronary artery. is due a stress myoview. does have some dyspnea on exertion  . SOB (shortness of breath) on exertion   . 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 Chest pain   . HLD (hyperlipidemia)     isdue followup lipids. on zocor 10 mg/day   . HTN (hypertension)   . Allergy     seasonal  . Cataract     removed  . Reflux esophagitis   . OSA (obstructive sleep apnea) 01/13/2015  . Erectile dysfunction   . Arthritis     all over- in general   . Cancer     skin, melanoma  . GERD (gastroesophageal reflux disease)     pt. denies     Social History  Substance Use Topics  . Smoking status: Former Smoker -- 2.50 packs/day for 40 years    Types: Cigarettes    Quit date: 03/24/2000  . Smokeless tobacco: Never Used  . Alcohol Use: 6.0 oz/week    10 Shots of liquor per week     Comment: occasional- 2 times per week     Family History  Problem Relation Age of Onset  . Hypertension Mother   . Heart disease Mother   . Hypertension Father   . Diabetes Father   . Cancer  Paternal Grandfather   . Heart disease Brother 58  . Colon cancer Neg Hx    No Known Allergies  OBJECTIVE: Blood pressure 140/64, pulse 81, temperature 98.2 F (36.8 C), temperature source Oral, resp. rate 29, height '5\' 8"'$  (1.727 m), weight 197 lb 12 oz (89.7 kg), SpO2 100 %. General: he is alert and comfortable visiting with family Skin: no rash Lungs: clear Cor: regular S1 and S2 with no murmur Chest: Back dressing in lower portion of sternal wound. No exposed bone or wires were noted with his most recent dressing change this morning Abdomen: nontender Extremities: Healed surgical incisions over his right first and second toes. Small scabbed ulcer or on the tip of his second toe. There is some prominence of the lateral aspect of the ball of his right foot without erythema or fluctuance  Lab Results Lab Results  Component Value  Date   WBC 16.3* 02/02/2015   HGB 12.5* 02/02/2015   HCT 37.1* 02/02/2015   MCV 90.3 02/02/2015   PLT 395 02/02/2015    Lab Results  Component Value Date   CREATININE 1.19 02/02/2015   BUN 25* 02/02/2015   NA 140 02/02/2015   K 3.4* 02/02/2015   CL 102 02/02/2015   CO2 28 02/02/2015    Lab Results  Component Value Date   ALT 63 02/02/2015   AST 50* 02/02/2015   ALKPHOS 94 02/02/2015   BILITOT 0.8 02/02/2015     Microbiology: Recent Results (from the past 240 hour(s))  Culture, expectorated sputum-assessment     Status: None   Collection Time: 01/29/15  1:30 PM  Result Value Ref Range Status   Specimen Description SPUTUM  Final   Special Requests Normal  Final   Sputum evaluation   Final    MICROSCOPIC FINDINGS SUGGEST THAT THIS SPECIMEN IS NOT REPRESENTATIVE OF LOWER RESPIRATORY SECRETIONS. PLEASE RECOLLECT. Results Called to: Reece Levy AT 7106 01/29/15 BY L BENFIELD    Report Status 01/29/2015 FINAL  Final  Culture, Urine     Status: None   Collection Time: 01/29/15  1:30 PM  Result Value Ref Range Status   Specimen Description  URINE, RANDOM  Final   Special Requests Normal  Final   Culture NO GROWTH 1 DAY  Final   Report Status 01/30/2015 FINAL  Final  Culture, blood (routine x 2)     Status: None   Collection Time: 01/29/15  2:21 PM  Result Value Ref Range Status   Specimen Description BLOOD LEFT ANTECUBITAL  Final   Special Requests BOTTLES DRAWN AEROBIC AND ANAEROBIC 10CC  Final   Culture  Setup Time   Final    GRAM NEGATIVE RODS ANAEROBIC BOTTLE ONLY CRITICAL RESULT CALLED TO, READ BACK BY AND VERIFIED WITH: Reece Levy AT [id number] ON [id number] BY Rhea Bleacher    Culture ENTEROBACTER AEROGENES  Final   Report Status 02/01/2015 FINAL  Final   Organism ID, Bacteria ENTEROBACTER AEROGENES  Final      Susceptibility   Enterobacter aerogenes - MIC*    CEFAZOLIN >=64 RESISTANT Resistant     CEFEPIME <=1 SENSITIVE Sensitive     CEFTAZIDIME <=1 SENSITIVE Sensitive     CEFTRIAXONE <=1 SENSITIVE Sensitive     CIPROFLOXACIN 1 INTERMEDIATE Intermediate     GENTAMICIN <=1 SENSITIVE Sensitive     IMIPENEM 2 SENSITIVE Sensitive     TRIMETH/SULFA <=20 SENSITIVE Sensitive     PIP/TAZO 8 SENSITIVE Sensitive     * ENTEROBACTER AEROGENES  Culture, blood (routine x 2)     Status: None (Preliminary result)   Collection Time: 01/29/15  2:30 PM  Result Value Ref Range Status   Specimen Description BLOOD LEFT ARM  Final   Special Requests BOTTLES DRAWN AEROBIC AND ANAEROBIC 5CC  Final   Culture  Setup Time   Final    GRAM NEGATIVE RODS GRAM POSITIVE COCCI IN BOTH AEROBIC AND ANAEROBIC BOTTLES CRITICAL RESULT CALLED TO, READ BACK BY AND VERIFIED WITH: WCOUNSELOR  0350 '@08'$ /23/16 MKELLY CRITICAL RESULT CALLED TO, READ BACK BY AND VERIFIED WITH: Thomasenia Bottoms, RN AT [phone] ON [id number] BY Rhea Bleacher NOTIFIED 2 ORGANISMS WERE PRESENT IN [facility] BOTTLE    Culture   Final    ENTEROBACTER AEROGENES SUSCEPTIBILITIES PERFORMED ON PREVIOUS CULTURE WITHIN THE LAST 5 DAYS. GRAM POSITIVE COCCI    Report Status PENDING  Incomplete  Wound culture  Status: None   Collection Time: 01/30/15  7:51 AM  Result Value Ref Range Status   Specimen Description WOUND STERNUM  Final   Special Requests Normal  Final   Gram Stain   Final    NO WBC SEEN RARE SQUAMOUS EPITHELIAL CELLS PRESENT NO ORGANISMS SEEN Performed at [facility]    Culture   Final    FEW ENTEROBACTER AEROGENES Performed at [facility]    Report Status 02/02/2015 FINAL  Final   Organism ID, Bacteria ENTEROBACTER AEROGENES  Final      Susceptibility   Enterobacter aerogenes - MIC*    CEFAZOLIN >=64 RESISTANT Resistant     CEFEPIME <=1 SENSITIVE Sensitive     CEFTAZIDIME <=1 SENSITIVE Sensitive     CEFTRIAXONE <=1 SENSITIVE Sensitive     CIPROFLOXACIN <=0.25 SENSITIVE Sensitive     GENTAMICIN <=1 SENSITIVE Sensitive     IMIPENEM 2 INTERMEDIATE Intermediate     PIP/TAZO <=4 SENSITIVE Sensitive     TOBRAMYCIN <=1 SENSITIVE Sensitive     TRIMETH/SULFA <=20 SENSITIVE Sensitive     * FEW ENTEROBACTER AEROGENES  Gram stain     Status: None   Collection Time: 01/30/15  7:54 AM  Result Value Ref Range Status   Specimen Description WOUND STERNUM  Final   Special Requests Normal  Final   Gram Stain   Final    FEW WBC PRESENT, PREDOMINANTLY PMN FEW GRAM POSITIVE COCCI IN PAIRS RARE GRAM VARIABLE ROD    Report Status 01/30/2015 FINAL  Final  Culture, respiratory (NON-Expectorated)     Status: None   Collection Time: 01/30/15 11:51 AM  Result Value Ref Range Status   Specimen Description TRACHEAL ASPIRATE  Final   Special Requests NONE  Final   Gram Stain   Final    RARE WBC PRESENT, PREDOMINANTLY PMN NO SQUAMOUS EPITHELIAL CELLS SEEN RARE GRAM NEGATIVE RODS Performed at [facility]    Culture   Final    Non-Pathogenic Oropharyngeal-type Flora Isolated. Performed at [facility]    Report Status 02/01/2015 FINAL  Final  Wound culture     Status: None (Preliminary result)   Collection Time: 01/31/15  8:35 AM  Result Value  Ref Range Status   Specimen Description WOUND  Final   Special Requests STERNUM  Final   Gram Stain   Final    ABUNDANT WBC PRESENT,BOTH PMN AND MONONUCLEAR NO SQUAMOUS EPITHELIAL CELLS SEEN NO ORGANISMS SEEN Performed at [facility]    Culture   Final    FEW GRAM NEGATIVE RODS Performed at [facility]    Report Status PENDING  Incomplete  Anaerobic culture     Status: None (Preliminary result)   Collection Time: 01/31/15  8:35 AM  Result Value Ref Range Status   Specimen Description WOUND  Final   Special Requests STERNUM  Final   Gram Stain PENDING  Incomplete   Culture   Final    NO ANAEROBES ISOLATED; CULTURE IN PROGRESS FOR 5 DAYS Performed at [facility]    Report Status PENDING  Incomplete    Michel Bickers, MD [facility] [phone] pager   [phone] cell 02/02/2015, 1:26 PM

## 2015-02-02 NOTE — Evaluation (Signed)
Clinical/Bedside Swallow Evaluation Patient Details  Name: Randall Ali. MRN: [id number] Date of Birth: [birth year]  Today's Date: 02/02/2015 Time: SLP Start Time (ACUTE ONLY): 48 SLP Stop Time (ACUTE ONLY): 1036 SLP Time Calculation (min) (ACUTE ONLY): 16 min  Past Medical History:  Past Medical History  Diagnosis Date  . CAD (coronary artery disease)     s/p inferior wall infarct in 10/01. has stent in Rt coronary artery. is due a stress myoview. does have some dyspnea on exertion  . SOB (shortness of breath) on exertion   . 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 Chest pain   . HLD (hyperlipidemia)     isdue followup lipids. on zocor 10 mg/day   . HTN (hypertension)   . Allergy     seasonal  . Cataract     removed  . Reflux esophagitis   . OSA (obstructive sleep apnea) 01/13/2015  . Erectile dysfunction   . Arthritis     all over- in general   . Cancer     skin, melanoma  . GERD (gastroesophageal reflux disease)     pt. denies    Past Surgical History:  Past Surgical History  Procedure Laterality Date  . Cardiac catheterization  06/24/11  . Carotid stent  03/10/2011  . Arm surgery  2010  . Shoulder arthroscopy  2012  . Wrist surgery  2011  . Toe surgery  1994  . Nasal sinus surgery  2008    septpolasty, bilateral turbinate reduction  . Hammer toe surgery      right toe  . Colonoscopy  2010  . Cardiac catheterization N/A 01/18/2015    Procedure: Left Heart Cath with coronary angiography;  Surgeon: Minna Merritts, MD;  Location: [facility];  Service: Cardiovascular;  Laterality: N/A;  . Cardiac catheterization N/A 01/18/2015    Procedure: Intravascular Pressure Wire/FFR Study;  Surgeon: [name], MD;  Location: [facility];  Service: Cardiovascular;  Laterality: N/A;  . Eye surgery      lasik 15 yrs. ago, cataracts removed - both eyes   . Coronary artery bypass graft N/A 01/24/2015    Procedure: CORONARY ARTERY BYPASS  GRAFTING x 4 (LIMA-LAD, SVG-Int 1- Int 2, SVG-PD) ENDOSCOPIC GREATER SAPHENOUS VEIN HARVEST LEFT LEG;  Surgeon: [name], MD;  Location: [facility];  Service: Open Heart Surgery;  Laterality: N/A;  . Tee without cardioversion N/A 01/24/2015    Procedure: TRANSESOPHAGEAL ECHOCARDIOGRAM (TEE);  Surgeon: [name], MD;  Location: [facility];  Service: Open Heart Surgery;  Laterality: N/A;   HPI:  74 year old male with recent dx of 3 vessel CAD was admitted for CABG 8/17. The procedure was without complication. Hospital course has been complicated by post-op AF, severe delirium requiring precedex, and dyspnea requiring BiPAP for hypoxia. CXR concerning for edema. Intubated 8/23, extubated 8/25.    Assessment / Plan / Recommendation Clinical Impression  Pt demonstrates immediate cough, wet vocal quality and expectoration with thin and puree textures. Pts vocal quality is clear at baseline and swallow is strong. Unclear what the etiology of dysphagia is subjectively, or if PO is safe. Recommend FEES to determine best diet; MD would like to proceed with this today.     Aspiration Risk  Mild    Diet Recommendation NPO        Other  Recommendations Oral Care Recommendations: Oral care QID   Follow Up Recommendations  Frequency and Duration        Pertinent Vitals/Pain NA    SLP Swallow Goals     Swallow Study Prior Functional Status       General Other Pertinent Information: 74 year old male with recent dx of 3 vessel CAD was admitted for CABG 8/17. The procedure was without complication. Hospital course has been complicated by post-op AF, severe delirium requiring precedex, and dyspnea requiring BiPAP for hypoxia. CXR concerning for edema. Intubated 8/23, extubated 8/25.  Type of Study: Bedside swallow evaluation Previous Swallow Assessment: none Diet Prior to this Study: NPO;TNA Temperature Spikes Noted: No Respiratory Status: Supplemental O2 delivered via  (comment) History of Recent Intubation: Yes Length of Intubations (days): 3 days Date extubated: 02/02/15 Behavior/Cognition: Alert;Cooperative;Pleasant mood Oral Cavity - Dentition: Adequate natural dentition/normal for age Self-Feeding Abilities: Able to feed self Patient Positioning: Upright in bed Baseline Vocal Quality: Normal Volitional Cough: Strong Volitional Swallow: Able to elicit    Oral/Motor/Sensory Function Overall Oral Motor/Sensory Function: Appears within functional limits for tasks assessed   Ice Chips Ice chips: Not tested   Thin Liquid Thin Liquid: Impaired Presentation: Cup;Self Fed;Straw Pharyngeal  Phase Impairments: Wet Vocal Quality;Multiple swallows;Throat Clearing - Immediate;Cough - Immediate    Nectar Thick Nectar Thick Liquid: Not tested   Honey Thick Honey Thick Liquid: Not tested   Puree Puree: Impaired Presentation: Spoon Pharyngeal Phase Impairments: Wet Vocal Quality;Throat Clearing - Immediate   Solid   GO    Solid: Not tested      Herbie Baltimore, MA CCC-SLP [phone]  Latice Waitman, Katherene Ponto 02/02/2015,10:44 AM

## 2015-02-02 NOTE — Progress Notes (Signed)
Speech pathology Note MBSS complete. Full report located under chart review in imaging section. Herbie Baltimore, [place] CCC-SLP [phone]

## 2015-02-02 NOTE — Consult Note (Addendum)
WOC consult requested for Vac dressing changes Q M/W/F.  Dr Servando Snare in earlier today and performed the Vac dressing change; WOC will plan to begin changes on Mon.  Current Vac dressing is intact with good seal to midline chest and cont suction on at 1106m.   DJulien GirtMSN, RN, CCraig [place]

## 2015-02-02 NOTE — Progress Notes (Signed)
PARENTERAL NUTRITION CONSULT NOTE - Follow-up  Pharmacy Consult for TPN Indication: MD - increased risk for aspiration due to BiPAP   No Known Allergies  Patient Measurements: Height: '5\' 8"'$  (172.7 cm) Weight: 197 lb 12 oz (89.7 kg) IBW/kg (Calculated) : 68.4  Vital Signs: Temp: 98.1 F (36.7 C) (08/26 0743) Temp Source: Oral (08/26 0350) BP: 134/70 mmHg (08/26 0600) Pulse Rate: 102 (08/26 0700) Intake/Output from previous day: 08/25 0701 - 08/26 0700 In: 3690.2 [I.V.:1030.7; NG/GT:30; IV Piggyback:637.5; TPN:1992] Out: 0867 [Urine:2930; Emesis/NG output:50; Drains:150]  Labs:  Recent Labs  01/31/15 0403 02/01/15 0353 02/02/15 0739  WBC 9.9 10.8* 16.3*  HGB 10.6* 10.4* 12.5*  HCT 32.4* 31.4* 37.1*  PLT 236 248 395    Recent Labs  01/31/15 0403 02/01/15 0353 02/02/15 0739  NA 141 140 140  K 3.6 3.5 3.4*  CL 99* 101 102  CO2 33* 32 28  GLUCOSE 148* 150* 149*  BUN 32* 30* 25*  CREATININE 1.27* 0.99 1.19  CALCIUM 8.5* 8.4* 8.8*  MG 2.6* 2.3  --   PHOS 4.2 3.6  --   PROT  --  5.3* 6.1*  ALBUMIN  --  2.1* 2.4*  AST  --  25 50*  ALT  --  27 63  ALKPHOS  --  56 94  BILITOT  --  0.9 0.8   Estimated Creatinine Clearance: 61 mL/min (by C-G formula based on Cr of 1.19).   Recent Labs  02/01/15 2331 02/02/15 0343 02/02/15 0833  GLUCAP 141* 141* 140*   Insulin Requirements in the past 24 hours:  20 units of regular insulin in TPN 6 units of SSI since 1800 last night  Current Nutrition:  Clinimix E 5/15 at 42m/hr (This provides 100g of protein and 1414 kcal meeting 71% of protein needs and > 100% of kcal needs)  Nutritional Goals: Per RD 8/23 Kcal: [address]Protein: 140-150 g Fluid: per MD  Assessment: 74yo with CAD and s/p CABG, now with some post-op complications including delerium/DT's, Afib, and resp. compromise requiring the use of BiPAP.  He is felt to be at high risk for aspiration by his surgeon and he is to be started on TPN for nutritional  support. No pertinent PMH for GI issues except a history of GERD.    Endo: No hx of DM. CBGs at goal on SSI Renal:  SCr 1.19, CrCl ~629mmin. UOP at 1.67m23mg/hr with IV lasix. CAD:  NSR (out of Afib) BP 124/70, HR 102 - dilt, lasix, metoprolol Hepatic:  AST elevated, Tbili WNL, albumin 2.4, Prealbumin 7.1. GI: Hx. GERD - IV PPI on board.  He has functioning gut, and transitioning to TF's should be fine once risk of BiPAP issues resolved. Lytes:  K 3.4, other lytes ok Neuro:  DT's ID: Day # 4 of abx for GNR bacteremia - Afebrile, WBC 16.3  8/22 Blood cx > Enterobacter 8/23 Resp cx > Rare GNRs 8/23 Wound cx > Few enterobacter  8/23 Zosyn >>8/26 8/26 CTX>>  Access:  Picc line - 8/20 TPN start:  8/21 >>  Plan:  - Continue Clinimix E 5/15 at 75m95m (Will be unable to meet patient protein / kcal needs with Clinimix) - Will not add IVFE for 7 days in ICU patient per ASPEN guidelines (Day #6) - Continue 20 units insulin in TPN - Continue current SSI - Continue MVI and trace elements in TPN - Potassium chloride 10me567m Q1H x 4 runs - F/u plans for oral diet - F/u  AM labs  Salome Arnt, PharmD, BCPS Pager # [phone] 02/02/2015 9:38 AM

## 2015-02-02 NOTE — Progress Notes (Signed)
Patient ID: Venancio Chenier., male   DOB: July 16, 1940, 74 y.o.   MRN: [id number] TCTS DAILY ICU PROGRESS NOTE                   [address]            [place] [address]          [phone]   9 Days Post-Op Procedure(s) (LRB): CORONARY ARTERY BYPASS GRAFTING x 4 (LIMA-LAD, SVG-Int 1- Int 2, SVG-PD) ENDOSCOPIC GREATER SAPHENOUS VEIN HARVEST LEFT LEG (N/A) TRANSESOPHAGEAL ECHOCARDIOGRAM (TEE) (N/A)  Total Length of Stay:  LOS: 9 days   Subjective: Mental status improved, alert and orientated  Objective: Vital signs in last 24 hours: Temp:  [97.9 F (36.6 C)-99.6 F (37.6 C)] 98.1 F (36.7 C) (08/26 0743) Pulse Rate:  [66-125] 106 (08/26 0900) Cardiac Rhythm:  [-]  Resp:  [16-35] 26 (08/26 0900) BP: (115-157)/(56-83) 154/69 mmHg (08/26 0900) SpO2:  [90 %-100 %] 100 % (08/26 0900) Weight:  [197 lb 12 oz (89.7 kg)] 197 lb 12 oz (89.7 kg) (08/26 0600)  Filed Weights   01/31/15 0500 02/01/15 0403 02/02/15 0600  Weight: 203 lb 11.3 oz (92.4 kg) 207 lb 0.2 oz (93.9 kg) 197 lb 12 oz (89.7 kg)    Weight change: -9 lb 4.2 oz (-4.2 kg)   Hemodynamic parameters for last 24 hours:    Intake/Output from previous day: 08/25 0701 - 08/26 0700 In: 3690.2 [I.V.:1030.7; NG/GT:30; IV Piggyback:637.5; TPN:1992] Out: 3130 [Urine:2930; Emesis/NG output:50; Drains:150]  Intake/Output this shift: Total I/O In: -  Out: 225 [Urine:175; Drains:50]  Current Meds: Scheduled Meds: . antiseptic oral rinse  7 mL Mouth Rinse 6 times per day  . bisacodyl  10 mg Rectal Daily  . cefTRIAXone (ROCEPHIN)  IV  2 g Intravenous Q24H  . chlorhexidine  15 mL Mouth Rinse BID  . enoxaparin (LOVENOX) injection  30 mg Subcutaneous Q24H  . furosemide  40 mg Intravenous Daily  . insulin aspart  0-24 Units Subcutaneous 6 times per day  . levalbuterol  0.63 mg Nebulization Q6H  . metoprolol  5 mg Intravenous Q12H  . mupirocin ointment   Nasal BID  . pantoprazole sodium  40 mg Per Tube  Q1200  . potassium chloride  10 mEq Intravenous Q1 Hr x 4  . sodium chloride  10-40 mL Intracatheter Q12H  . thiamine  100 mg Intravenous Daily   Continuous Infusions: . Marland KitchenTPN (CLINIMIX-E) Adult 83 mL/hr at 02/02/15 0700  . Marland KitchenTPN (CLINIMIX-E) Adult    . sodium chloride 10 mL/hr at 02/02/15 0700  . diltiazem (CARDIZEM) infusion 7 mg/hr (02/02/15 0700)   PRN Meds:.Place/Maintain arterial line **AND** sodium chloride, fentaNYL (SUBLIMAZE) injection, haloperidol lactate, LORazepam, metoprolol, ondansetron (ZOFRAN) IV, potassium chloride, potassium chloride, sodium chloride, traMADol  General appearance: alert, cooperative and no distress Neurologic: intact Heart: irregularly irregular rhythm Lungs: diminished breath sounds bibasilar Abdomen: soft, non-tender; bowel sounds normal; no masses,  no organomegaly Extremities: extremities normal, atraumatic, no cyanosis or edema and Homans sign is negative, no sign of DVT Wound: vac changed today, still "soupy" AT LOWER INCISION DEEP, BONE COVERED AND STABLE  Lab Results: CBC: Recent Labs  02/01/15 0353 02/02/15 0739  WBC 10.8* 16.3*  HGB 10.4* 12.5*  HCT 31.4* 37.1*  PLT 248 395   BMET:  Recent Labs  02/01/15 0353 02/02/15 0739  NA 140 140  K 3.5 3.4*  CL 101 102  CO2 32 28  GLUCOSE 150* 149*  BUN 30* 25*  CREATININE 0.99 1.19  CALCIUM 8.4* 8.8*    PT/INR: No results for input(s): LABPROT, INR in the last 72 hours. Radiology: Dg Chest Port 1 View  02/02/2015   CLINICAL DATA:  CABG.  EXAM: PORTABLE CHEST - 1 VIEW  COMPARISON:  02/01/2015 .  FINDINGS: Right PICC line in stable position. Interim extubation and removal of NG tube. Prior CABG. Left lower lobe infiltrate consistent with pneumonia. Small left pleural effusion. No pneumothorax.  IMPRESSION: 1.  Right PICC line stable position.  2. Left lower lobe infiltrate consistent with pneumonia. Small left pleural effusion.   Electronically Signed   By: Marcello Moores  Register   On:  02/02/2015 07:13   Results for orders placed or performed during the hospital encounter of 01/24/15  Culture, expectorated sputum-assessment     Status: None   Collection Time: 01/29/15  1:30 PM  Result Value Ref Range Status   Specimen Description SPUTUM  Final   Special Requests Normal  Final   Sputum evaluation   Final    MICROSCOPIC FINDINGS SUGGEST THAT THIS SPECIMEN IS NOT REPRESENTATIVE OF LOWER RESPIRATORY SECRETIONS. PLEASE RECOLLECT. Results Called to: Reece Levy AT 7062 01/29/15 BY L BENFIELD    Report Status 01/29/2015 FINAL  Final  Culture, Urine     Status: None   Collection Time: 01/29/15  1:30 PM  Result Value Ref Range Status   Specimen Description URINE, RANDOM  Final   Special Requests Normal  Final   Culture NO GROWTH 1 DAY  Final   Report Status 01/30/2015 FINAL  Final  Culture, blood (routine x 2)     Status: None   Collection Time: 01/29/15  2:21 PM  Result Value Ref Range Status   Specimen Description BLOOD LEFT ANTECUBITAL  Final   Special Requests BOTTLES DRAWN AEROBIC AND ANAEROBIC 10CC  Final   Culture  Setup Time   Final    GRAM NEGATIVE RODS ANAEROBIC BOTTLE ONLY CRITICAL RESULT CALLED TO, READ BACK BY AND VERIFIED WITH: Reece Levy AT [id number] ON [id number] BY Rhea Bleacher    Culture ENTEROBACTER AEROGENES  Final   Report Status 02/01/2015 FINAL  Final   Organism ID, Bacteria ENTEROBACTER AEROGENES  Final      Susceptibility   Enterobacter aerogenes - MIC*    CEFAZOLIN >=64 RESISTANT Resistant     CEFEPIME <=1 SENSITIVE Sensitive     CEFTAZIDIME <=1 SENSITIVE Sensitive     CEFTRIAXONE <=1 SENSITIVE Sensitive     CIPROFLOXACIN 1 INTERMEDIATE Intermediate     GENTAMICIN <=1 SENSITIVE Sensitive     IMIPENEM 2 SENSITIVE Sensitive     TRIMETH/SULFA <=20 SENSITIVE Sensitive     PIP/TAZO 8 SENSITIVE Sensitive     * ENTEROBACTER AEROGENES  Culture, blood (routine x 2)     Status: None (Preliminary result)   Collection Time: 01/29/15  2:30 PM  Result Value  Ref Range Status   Specimen Description BLOOD LEFT ARM  Final   Special Requests BOTTLES DRAWN AEROBIC AND ANAEROBIC 5CC  Final   Culture  Setup Time   Final    GRAM NEGATIVE RODS GRAM POSITIVE COCCI IN BOTH AEROBIC AND ANAEROBIC BOTTLES CRITICAL RESULT CALLED TO, READ BACK BY AND VERIFIED WITH: Limmie Patricia  6160 '@08'$ /23/16 MKELLY CRITICAL RESULT CALLED TO, READ BACK BY AND VERIFIED WITH: Thomasenia Bottoms, RN AT [phone] ON [id number] BY Rhea Bleacher NOTIFIED 2 ORGANISMS WERE PRESENT IN [facility] BOTTLE    Culture   Final    ENTEROBACTER AEROGENES  SUSCEPTIBILITIES PERFORMED ON PREVIOUS CULTURE WITHIN THE LAST 5 DAYS. GRAM POSITIVE COCCI    Report Status PENDING  Incomplete  Wound culture     Status: None   Collection Time: 01/30/15  7:51 AM  Result Value Ref Range Status   Specimen Description WOUND STERNUM  Final   Special Requests Normal  Final   Gram Stain   Final    NO WBC SEEN RARE SQUAMOUS EPITHELIAL CELLS PRESENT NO ORGANISMS SEEN Performed at [facility]    Culture   Final    FEW ENTEROBACTER AEROGENES Performed at [facility]    Report Status 02/02/2015 FINAL  Final   Organism ID, Bacteria ENTEROBACTER AEROGENES  Final      Susceptibility   Enterobacter aerogenes - MIC*    CEFAZOLIN >=64 RESISTANT Resistant     CEFEPIME <=1 SENSITIVE Sensitive     CEFTAZIDIME <=1 SENSITIVE Sensitive     CEFTRIAXONE <=1 SENSITIVE Sensitive     CIPROFLOXACIN <=0.25 SENSITIVE Sensitive     GENTAMICIN <=1 SENSITIVE Sensitive     IMIPENEM 2 INTERMEDIATE Intermediate     PIP/TAZO <=4 SENSITIVE Sensitive     TOBRAMYCIN <=1 SENSITIVE Sensitive     TRIMETH/SULFA <=20 SENSITIVE Sensitive     * FEW ENTEROBACTER AEROGENES  Gram stain     Status: None   Collection Time: 01/30/15  7:54 AM  Result Value Ref Range Status   Specimen Description WOUND STERNUM  Final   Special Requests Normal  Final   Gram Stain   Final    FEW WBC PRESENT, PREDOMINANTLY PMN FEW GRAM POSITIVE COCCI IN PAIRS RARE  GRAM VARIABLE ROD    Report Status 01/30/2015 FINAL  Final  Culture, respiratory (NON-Expectorated)     Status: None   Collection Time: 01/30/15 11:51 AM  Result Value Ref Range Status   Specimen Description TRACHEAL ASPIRATE  Final   Special Requests NONE  Final   Gram Stain   Final    RARE WBC PRESENT, PREDOMINANTLY PMN NO SQUAMOUS EPITHELIAL CELLS SEEN RARE GRAM NEGATIVE RODS Performed at [facility]    Culture   Final    Non-Pathogenic Oropharyngeal-type Flora Isolated. Performed at [facility]    Report Status 02/01/2015 FINAL  Final  Wound culture     Status: None (Preliminary result)   Collection Time: 01/31/15  8:35 AM  Result Value Ref Range Status   Specimen Description WOUND  Final   Special Requests STERNUM  Final   Gram Stain   Final    ABUNDANT WBC PRESENT,BOTH PMN AND MONONUCLEAR NO SQUAMOUS EPITHELIAL CELLS SEEN NO ORGANISMS SEEN Performed at [facility]    Culture   Final    FEW GRAM NEGATIVE RODS Performed at [facility]    Report Status PENDING  Incomplete  Anaerobic culture     Status: None (Preliminary result)   Collection Time: 01/31/15  8:35 AM  Result Value Ref Range Status   Specimen Description WOUND  Final   Special Requests STERNUM  Final   Gram Stain PENDING  Incomplete   Culture   Final    NO ANAEROBES ISOLATED; CULTURE IN PROGRESS FOR 5 DAYS Performed at [facility]    Report Status PENDING  Incomplete    Assessment/Plan: S/P Procedure(s) (LRB): CORONARY ARTERY BYPASS GRAFTING x 4 (LIMA-LAD, SVG-Int 1- Int 2, SVG-PD) ENDOSCOPIC GREATER SAPHENOUS VEIN HARVEST LEFT LEG (N/A) TRANSESOPHAGEAL ECHOCARDIOGRAM (TEE) (N/A) Mobilize Diuresis Diabetes control Continue ABX therapy due to  Post-op infection Continue vac changes  Enterobacter AEROGENEA,  Blood stream infection, so far not cultured in wound but suspect origin  Now back in a flutter- attempt to rapid  A pace did not convert , will  resume cordrone On DVT dose lovenox   [name] 02/02/2015 9:49 AM

## 2015-02-02 NOTE — Progress Notes (Signed)
  Amiodarone Drug - Drug Interaction Consult Note  Recommendations: -No medication adjustments are needed  Amiodarone is metabolized by the cytochrome P450 system and therefore has the potential to cause many drug interactions. Amiodarone has an average plasma half-life of 50 days (range 20 to 100 days).   There is potential for drug interactions to occur several weeks or months after stopping treatment and the onset of drug interactions may be slow after initiating amiodarone.   '[]'$  Statins: Increased risk of myopathy. Simvastatin- restrict dose to '20mg'$  daily. Other statins: counsel patients to report any muscle pain or weakness immediately.  '[]'$  Anticoagulants: Amiodarone can increase anticoagulant effect. Consider warfarin dose reduction. Patients should be monitored closely and the dose of anticoagulant altered accordingly, remembering that amiodarone levels take several weeks to stabilize.  '[]'$  Antiepileptics: Amiodarone can increase plasma concentration of phenytoin, the dose should be reduced. Note that small changes in phenytoin dose can result in large changes in levels. Monitor patient and counsel on signs of toxicity.  '[x]'$  Beta blockers: increased risk of bradycardia, AV block and myocardial depression. Sotalol - avoid concomitant use.  '[]'$   Calcium channel blockers (diltiazem and verapamil): increased risk of bradycardia, AV block and myocardial depression.  '[]'$   Cyclosporine: Amiodarone increases levels of cyclosporine. Reduced dose of cyclosporine is recommended.  '[]'$  Digoxin dose should be halved when amiodarone is started.  '[]'$  Diuretics: increased risk of cardiotoxicity if hypokalemia occurs.  '[]'$  Oral hypoglycemic agents (glyburide, glipizide, glimepiride): increased risk of hypoglycemia. Patient's glucose levels should be monitored closely when initiating amiodarone therapy.   '[]'$  Drugs that prolong the QT interval:  Torsades de pointes risk may be increased with concurrent use  - avoid if possible.  Monitor QTc, also keep magnesium/potassium WNL if concurrent therapy can't be avoided. [name] Antibiotics: e.g. fluoroquinolones, erythromycin. . Antiarrhythmics: e.g. quinidine, procainamide, disopyramide, sotalol. . Antipsychotics: e.g. phenothiazines, haloperidol.  . Lithium, tricyclic antidepressants, and methadone.   Thank You,   Hildred Laser, Pharm D 02/02/2015 11:21 AM

## 2015-02-03 ENCOUNTER — Inpatient Hospital Stay (HOSPITAL_COMMUNITY): Payer: PPO

## 2015-02-03 LAB — GLUCOSE, CAPILLARY
Glucose-Capillary: 120 mg/dL — ABNORMAL HIGH (ref 65–99)
Glucose-Capillary: 125 mg/dL — ABNORMAL HIGH (ref 65–99)
Glucose-Capillary: 134 mg/dL — ABNORMAL HIGH (ref 65–99)
Glucose-Capillary: 138 mg/dL — ABNORMAL HIGH (ref 65–99)
Glucose-Capillary: 91 mg/dL (ref 65–99)
Glucose-Capillary: 98 mg/dL (ref 65–99)

## 2015-02-03 LAB — BASIC METABOLIC PANEL
Anion gap: 9 (ref 5–15)
BUN: 26 mg/dL — ABNORMAL HIGH (ref 6–20)
CO2: 29 mmol/L (ref 22–32)
Calcium: 8.8 mg/dL — ABNORMAL LOW (ref 8.9–10.3)
Chloride: 102 mmol/L (ref 101–111)
Creatinine, Ser: 1.19 mg/dL (ref 0.61–1.24)
GFR calc Af Amer: >60 mL/min (ref >60)
GFR calc non Af Amer: 59 mL/min — ABNORMAL LOW (ref >60)
Glucose, Bld: 116 mg/dL — ABNORMAL HIGH (ref 65–99)
Potassium: 3.8 mmol/L (ref 3.5–5.1)
Sodium: 140 mmol/L (ref 135–145)

## 2015-02-03 LAB — CBC
HCT: 37.1 % — ABNORMAL LOW (ref 39.0–52.0)
Hemoglobin: 12.4 g/dL — ABNORMAL LOW (ref 13.0–17.0)
MCH: 29.9 pg (ref 26.0–34.0)
MCHC: 33.4 g/dL (ref 30.0–36.0)
MCV: 89.4 fL (ref 78.0–100.0)
Platelets: 381 10*3/uL (ref 150–400)
RBC: 4.15 MIL/uL — ABNORMAL LOW (ref 4.22–5.81)
RDW: 14.5 % (ref 11.5–15.5)
WBC: 15 10*3/uL — ABNORMAL HIGH (ref 4.0–10.5)

## 2015-02-03 LAB — WOUND CULTURE

## 2015-02-03 LAB — MAGNESIUM: Magnesium: 2.2 mg/dL (ref 1.7–2.4)

## 2015-02-03 MED ORDER — INSULIN ASPART 100 UNIT/ML ~~LOC~~ SOLN
0.0000 [IU] | Freq: Three times a day (TID) | SUBCUTANEOUS | Status: DC
  Administered 2015-02-03 (×2): 2 [IU] via SUBCUTANEOUS
  Administered 2015-02-04: 3 [IU] via SUBCUTANEOUS
  Administered 2015-02-04 – 2015-02-05 (×2): 2 [IU] via SUBCUTANEOUS
  Administered 2015-02-06: 3 [IU] via SUBCUTANEOUS
  Administered 2015-02-07 – 2015-02-08 (×2): 2 [IU] via SUBCUTANEOUS

## 2015-02-03 NOTE — Progress Notes (Signed)
TCTS BRIEF SICU PROGRESS NOTE  10 Days Post-Op  S/P Procedure(s) (LRB): CORONARY ARTERY BYPASS GRAFTING x 4 (LIMA-LAD, SVG-Int 1- Int 2, SVG-PD) ENDOSCOPIC GREATER SAPHENOUS VEIN HARVEST LEFT LEG (N/A) TRANSESOPHAGEAL ECHOCARDIOGRAM (TEE) (N/A)   Stable day  Plan: Continue current plan  Rexene Alberts 02/03/2015 5:02 PM

## 2015-02-03 NOTE — Progress Notes (Signed)
Speech Language Pathology Treatment: Dysphagia  Patient Details Name: Randall Ali. MRN: [id number] DOB: [birth year] Today's Date: 02/03/2015 Time: 0920-0940 SLP Time Calculation (min) (ACUTE ONLY): 20 min  Assessment / Plan / Recommendation Clinical Impression  ST follow up for education and therapeutic diet tolerance. Nursing reported that the patient really dislikes the thickened liquids.  Results of MBSS were reviewed with the patient and risks of aspiration were discussed.  Peach tea was thickened using crystal light packs found on the unit using 8 ozs of water and the patient found that to be palatable.  He's agreeable to continue with the thickened liquids for the time being with the thickened peach tea and lemonade.  The patient was unable to independently recall safe swallow precautions.  However, given cues he was able to recall and use them appropriately.   Meal observation was completed and that patient appeared to tolerate the diet well.  Given minimal cues he used his swallowing precautions.  Recommend continue with current diet and ST to follow for therapeutic diet tolerance, education and possible diet advancement.     HPI Other Pertinent Information: 74 year old male with recent dx of 3 vessel CAD was admitted for CABG 8/17. The procedure was without complication. Hospital course has been complicated by post-op AF, severe delirium requiring precedex, and dyspnea requiring BiPAP for hypoxia. CXR concerning for edema. Intubated 8/23, extubated 8/25.    Pertinent Vitals Pain Assessment: 0-10 Pain Score: 2  Pain Location: chest Pain Intervention(s): Monitored during session  SLP Plan  Continue with current plan of care    Recommendations Diet recommendations: Dysphagia 3 (mechanical soft);Nectar-thick liquid Liquids provided via: Cup;No straw Medication Administration: Crushed with puree Supervision: Staff to assist with self feeding Compensations: Effortful  swallow;Multiple dry swallows after each bite/sip Postural Changes and/or Swallow Maneuvers: Seated upright 90 degrees;Upright 30-60 min after meal              Plan: Continue with current plan of care    GO     Lamar Sprinkles 02/03/2015, 9:48 AM  Shelly Flatten, [place], [place] SLP [phone]

## 2015-02-03 NOTE — Progress Notes (Signed)
Unable to draw blood back on PICC line x3 ports. Phlebotomy called to obtain 0500 labs.

## 2015-02-03 NOTE — Progress Notes (Addendum)
      [address]       [place] [address]             [phone]        CARDIOTHORACIC SURGERY PROGRESS NOTE   R10 Days Post-Op Procedure(s) (LRB): CORONARY ARTERY BYPASS GRAFTING x 4 (LIMA-LAD, SVG-Int 1- Int 2, SVG-PD) ENDOSCOPIC GREATER SAPHENOUS VEIN HARVEST LEFT LEG (N/A) TRANSESOPHAGEAL ECHOCARDIOGRAM (TEE) (N/A)  Subjective: No complaints.  Ambulated around unit.  Feels better.  Objective: Vital signs: BP Readings from Last 1 Encounters:  02/03/15 145/63   Pulse Readings from Last 1 Encounters:  02/03/15 92   Resp Readings from Last 1 Encounters:  02/03/15 32   Temp Readings from Last 1 Encounters:  02/03/15 97.9 F (36.6 C) Oral    Hemodynamics:    Physical Exam:  Rhythm:   sinus  Breath sounds: clear  Heart sounds:  RRR  Incisions:  Wound VAC intact  Abdomen:  Soft, non-distended, non-tender  Extremities:  Warm, well-perfused    Intake/Output from previous day: 08/26 0701 - 08/27 0700 In: 2444.4 [P.O.:950; I.V.:457.4; IV Piggyback:250; TPN:787] Out: 3070 [Urine:2945; Drains:125] Intake/Output this shift: Total I/O In: 220.1 [P.O.:120; I.V.:50.1; IV Piggyback:50] Out: 220 [Urine:220]  Lab Results:  CBC: Recent Labs  02/02/15 0739 02/03/15 0530  WBC 16.3* 15.0*  HGB 12.5* 12.4*  HCT 37.1* 37.1*  PLT 395 381    BMET:  Recent Labs  02/02/15 0739 02/03/15 0530  NA 140 140  K 3.4* 3.8  CL 102 102  CO2 28 29  GLUCOSE 149* 116*  BUN 25* 26*  CREATININE 1.19 1.19  CALCIUM 8.8* 8.8*     PT/INR:  No results for input(s): LABPROT, INR in the last 72 hours.  CBG (last 3)   Recent Labs  02/02/15 2343 02/03/15 0358 02/03/15 0710  GLUCAP 91 120* 134*    ABG    Component Value Date/Time   PHART 7.513* 01/30/2015 0523   PCO2ART 37.8 01/30/2015 0523   PO2ART 169* 01/30/2015 0523   HCO3 30.2* 01/30/2015 0523   TCO2 31.4 01/30/2015 0523   ACIDBASEDEF 4.0* 01/24/2015 2031   O2SAT 99.1 01/30/2015 0523     CXR: PORTABLE CHEST - 1 VIEW  COMPARISON: 02/02/2015 and 02/01/2015.  FINDINGS: 0525 hours. Right arm PICC appears unchanged at the lower SVC level. The heart size and mediastinal contours are stable status post CABG. There is increased interstitial prominence in both lungs suspicious for mild edema. Retrocardiac atelectasis and probable small bilateral pleural effusions are unchanged. There is no pneumothorax.  IMPRESSION: Increased interstitial prominence suspicious for mild edema.   Electronically Signed  By: Richardean Sale M.D.  On: 02/03/2015 09:18   Assessment/Plan: S/P Procedure(s) (LRB): CORONARY ARTERY BYPASS GRAFTING x 4 (LIMA-LAD, SVG-Int 1- Int 2, SVG-PD) ENDOSCOPIC GREATER SAPHENOUS VEIN HARVEST LEFT LEG (N/A) TRANSESOPHAGEAL ECHOCARDIOGRAM (TEE) (N/A)  Overall stable now POD10 Maintaining NSR w/ stable BP Enterobacter bacteremia with sternal wound infection Afebrile and leukocytosis drifting down   Continue I.V. Rocephin and wound VAC  Continue amiodarone  D/C foley cath  Mobilize  Change CBG's and SSI to ac/hs  Rexene Alberts 02/03/2015 11:00 AM

## 2015-02-04 LAB — GLUCOSE, CAPILLARY
Glucose-Capillary: 114 mg/dL — ABNORMAL HIGH (ref 65–99)
Glucose-Capillary: 137 mg/dL — ABNORMAL HIGH (ref 65–99)
Glucose-Capillary: 171 mg/dL — ABNORMAL HIGH (ref 65–99)
Glucose-Capillary: 110 mg/dL — ABNORMAL HIGH (ref 65–99)

## 2015-02-04 LAB — BASIC METABOLIC PANEL
Anion gap: 9 (ref 5–15)
BUN: 28 mg/dL — ABNORMAL HIGH (ref 6–20)
CO2: 29 mmol/L (ref 22–32)
Calcium: 8.9 mg/dL (ref 8.9–10.3)
Chloride: 105 mmol/L (ref 101–111)
Creatinine, Ser: 1.34 mg/dL — ABNORMAL HIGH (ref 0.61–1.24)
GFR calc Af Amer: 59 mL/min — ABNORMAL LOW (ref >60)
GFR calc non Af Amer: 51 mL/min — ABNORMAL LOW (ref >60)
Glucose, Bld: 125 mg/dL — ABNORMAL HIGH (ref 65–99)
Potassium: 4 mmol/L (ref 3.5–5.1)
Sodium: 143 mmol/L (ref 135–145)

## 2015-02-04 MED ORDER — AMIODARONE HCL 200 MG PO TABS: 200.0000 mg | ORAL_TABLET | Freq: Every day | ORAL | Status: DC

## 2015-02-04 MED ORDER — AMIODARONE IV BOLUS ONLY 150 MG/100ML
150.0000 mg | Freq: Once | INTRAVENOUS | Status: AC
  Administered 2015-02-04: 150 mg via INTRAVENOUS
  Filled 2015-02-04: qty 100

## 2015-02-04 MED ORDER — AMIODARONE HCL 200 MG PO TABS
400.0000 mg | ORAL_TABLET | Freq: Two times a day (BID) | ORAL | Status: AC
  Administered 2015-02-04 – 2015-02-09 (×10): 400 mg via ORAL
  Filled 2015-02-04 (×12): qty 2

## 2015-02-04 NOTE — Progress Notes (Signed)
TCTS BRIEF SICU PROGRESS NOTE  11 Days Post-Op  S/P Procedure(s) (LRB): CORONARY ARTERY BYPASS GRAFTING x 4 (LIMA-LAD, SVG-Int 1- Int 2, SVG-PD) ENDOSCOPIC GREATER SAPHENOUS VEIN HARVEST LEFT LEG (N/A) TRANSESOPHAGEAL ECHOCARDIOGRAM (TEE) (N/A)   Stable day  Plan: Continue current plan  Rexene Alberts 02/04/2015 7:15 PM

## 2015-02-04 NOTE — Progress Notes (Signed)
      [address]       [place] [address]             [phone]        CARDIOTHORACIC SURGERY PROGRESS NOTE   R11 Days Post-Op Procedure(s) (LRB): CORONARY ARTERY BYPASS GRAFTING x 4 (LIMA-LAD, SVG-Int 1- Int 2, SVG-PD) ENDOSCOPIC GREATER SAPHENOUS VEIN HARVEST LEFT LEG (N/A) TRANSESOPHAGEAL ECHOCARDIOGRAM (TEE) (N/A)  Subjective: No complaints  Objective: Vital signs: BP Readings from Last 1 Encounters:  02/04/15 122/72   Pulse Readings from Last 1 Encounters:  02/04/15 85   Resp Readings from Last 1 Encounters:  02/04/15 19   Temp Readings from Last 1 Encounters:  02/04/15 98.4 F (36.9 C) Oral    Hemodynamics:    Physical Exam:  Rhythm:   Back into Afib this morning w/ HR 100-110  Breath sounds: clear  Heart sounds:  irregular  Incisions:  Wound VAC intact  Abdomen:  Soft, non-distended, non-tender  Extremities:  Warm, well-perfused    Intake/Output from previous day: 08/27 0701 - 08/28 0700 In: 946.9 [P.O.:780; I.V.:116.9; IV Piggyback:50] Out: 1840 [JSRPR:9458; Drains:50] Intake/Output this shift: Total I/O In: 30 [I.V.:30] Out: -   Lab Results:  CBC: Recent Labs  02/02/15 0739 02/03/15 0530  WBC 16.3* 15.0*  HGB 12.5* 12.4*  HCT 37.1* 37.1*  PLT 395 381    BMET:  Recent Labs  02/03/15 0530 02/04/15 0240  NA 140 143  K 3.8 4.0  CL 102 105  CO2 29 29  GLUCOSE 116* 125*  BUN 26* 28*  CREATININE 1.19 1.34*  CALCIUM 8.8* 8.9     PT/INR:  No results for input(s): LABPROT, INR in the last 72 hours.  CBG (last 3)   Recent Labs  02/03/15 1758 02/03/15 2237 02/04/15 0718  GLUCAP 138* 98 114*    ABG    Component Value Date/Time   PHART 7.513* 01/30/2015 0523   PCO2ART 37.8 01/30/2015 0523   PO2ART 169* 01/30/2015 0523   HCO3 30.2* 01/30/2015 0523   TCO2 31.4 01/30/2015 0523   ACIDBASEDEF 4.0* 01/24/2015 2031   O2SAT 99.1 01/30/2015 0523    CXR: n/a  Assessment/Plan: S/P Procedure(s)  (LRB): CORONARY ARTERY BYPASS GRAFTING x 4 (LIMA-LAD, SVG-Int 1- Int 2, SVG-PD) ENDOSCOPIC GREATER SAPHENOUS VEIN HARVEST LEFT LEG (N/A) TRANSESOPHAGEAL ECHOCARDIOGRAM (TEE) (N/A)  Overall stable now POD11 Back in Afib this morning w/ stable BP Enterobacter bacteremia with sternal wound infection Afebrile and leukocytosis drifting down   Continue I.V. Rocephin and wound VAC  Continue amiodarone - will give extra 150 IV bolus and increase oral dose  Mobilize  Randall Ali 02/04/2015 9:53 AM

## 2015-02-05 LAB — BASIC METABOLIC PANEL
Anion gap: 10 (ref 5–15)
BUN: 33 mg/dL — ABNORMAL HIGH (ref 6–20)
CO2: 27 mmol/L (ref 22–32)
Calcium: 9.2 mg/dL (ref 8.9–10.3)
Chloride: 107 mmol/L (ref 101–111)
Creatinine, Ser: 1.35 mg/dL — ABNORMAL HIGH (ref 0.61–1.24)
GFR calc Af Amer: 59 mL/min — ABNORMAL LOW (ref >60)
GFR calc non Af Amer: 51 mL/min — ABNORMAL LOW (ref >60)
Glucose, Bld: 138 mg/dL — ABNORMAL HIGH (ref 65–99)
Potassium: 3.8 mmol/L (ref 3.5–5.1)
Sodium: 144 mmol/L (ref 135–145)

## 2015-02-05 LAB — CBC
HCT: 37.9 % — ABNORMAL LOW (ref 39.0–52.0)
Hemoglobin: 12.7 g/dL — ABNORMAL LOW (ref 13.0–17.0)
MCH: 30.2 pg (ref 26.0–34.0)
MCHC: 33.5 g/dL (ref 30.0–36.0)
MCV: 90.2 fL (ref 78.0–100.0)
Platelets: 441 10*3/uL — ABNORMAL HIGH (ref 150–400)
RBC: 4.2 MIL/uL — ABNORMAL LOW (ref 4.22–5.81)
RDW: 14.5 % (ref 11.5–15.5)
WBC: 17.5 10*3/uL — ABNORMAL HIGH (ref 4.0–10.5)

## 2015-02-05 LAB — GLUCOSE, CAPILLARY
Glucose-Capillary: 112 mg/dL — ABNORMAL HIGH (ref 65–99)
Glucose-Capillary: 114 mg/dL — ABNORMAL HIGH (ref 65–99)
Glucose-Capillary: 147 mg/dL — ABNORMAL HIGH (ref 65–99)
Glucose-Capillary: 119 mg/dL — ABNORMAL HIGH (ref 65–99)

## 2015-02-05 LAB — ANAEROBIC CULTURE

## 2015-02-05 MED ORDER — ZOLPIDEM TARTRATE 5 MG PO TABS: 5.0000 mg | ORAL_TABLET | Freq: Every evening | ORAL | Status: DC | PRN

## 2015-02-05 MED ORDER — DIPHENHYDRAMINE HCL 25 MG PO CAPS: 25.0000 mg | ORAL_CAPSULE | Freq: Every evening | ORAL | Status: DC | PRN

## 2015-02-05 MED ORDER — METOPROLOL TARTRATE 12.5 MG HALF TABLET
12.5000 mg | ORAL_TABLET | Freq: Two times a day (BID) | ORAL | Status: DC
  Administered 2015-02-05 – 2015-02-09 (×9): 12.5 mg via ORAL
  Filled 2015-02-05 (×11): qty 1

## 2015-02-05 MED ORDER — DIPHENHYDRAMINE HCL 25 MG PO CAPS
25.0000 mg | ORAL_CAPSULE | Freq: Every evening | ORAL | Status: DC | PRN
  Administered 2015-02-06: 25 mg via ORAL
  Filled 2015-02-05 (×2): qty 1

## 2015-02-05 MED ORDER — FENTANYL CITRATE (PF) 100 MCG/2ML IJ SOLN: 12.5000 ug | INTRAMUSCULAR | Status: DC | PRN

## 2015-02-05 MED ORDER — CETYLPYRIDINIUM CHLORIDE 0.05 % MT LIQD
7.0000 mL | Freq: Two times a day (BID) | OROMUCOSAL | Status: DC
  Administered 2015-02-06: 7 mL via OROMUCOSAL

## 2015-02-05 NOTE — Progress Notes (Signed)
Speech Language Pathology Treatment: Dysphagia  Patient Details Name: Randall Ali. MRN: [id number] DOB: [birth year] Today's Date: 02/05/2015 Time: 1829-9371 SLP Time Calculation (min) (ACUTE ONLY): 16 min  Assessment / Plan / Recommendation Clinical Impression  Pt was alert and independently sat up during treatment. Pt exhibited signs and symptoms of potential aspiration (e.g. wet vocal quality and delayed throat clear) on thin liquids. Pt tolerated nectar thick liquids. Pt did not independently recall swallowing strategies and postural changes. SLP provided moderate to max cuing and visual reinforcement for rationale because Pt repeatedly stated he did not like nectar thick liquid and would rather drink nothing. Pt was also not initally receptive to a modified diet to reduce his risk of aspiration. Pt was observed eating ice while laying at 30 degrees prior to treatment, SLP strongly reinforced upright posture. SLP recommends dysphagia 3 (mech soft) and nectar-thick liquid is continued to ensure a safe swallow due to pts high risk of aspiration. SLP reviewed with Pt and RN diet recommendations and strategies. As the Pt's overall health improves, anticipate diet upgrade with strategies. Pt may need repeat MBS prior to discharge.    HPI Other Pertinent Information: 74 year old male with recent dx of 3 vessel CAD was admitted for CABG 8/17. The procedure was without complication. Hospital course has been complicated by post-op AF, severe delirium requiring precedex, and dyspnea requiring BiPAP for hypoxia. CXR concerning for edema. Intubated 8/23, extubated 8/25.    Pertinent Vitals Pain Assessment: Faces Pain Score: 0-No pain Faces Pain Scale: No hurt  SLP Plan  Continue with current plan of care    Recommendations Diet recommendations: Dysphagia 3 (mechanical soft);Nectar-thick liquid Liquids provided via: Cup;No straw Medication Administration: Crushed with puree Supervision: Staff to  assist with self feeding Compensations: Small sips/bites;Effortful swallow Postural Changes and/or Swallow Maneuvers: Seated upright 90 degrees              Oral Care Recommendations: Oral care BID Plan: Continue with current plan of care    [place], Student-SLP  Lanier Ensign 02/05/2015, 10:53 AM

## 2015-02-05 NOTE — Progress Notes (Signed)
      [address]       [place] [address]             [phone]      No new complaints  BP 113/73 mmHg  Pulse 81  Temp(Src) 97.5 F (36.4 C) (Oral)  Resp 20  Ht '5\' 8"'$  (1.727 m)  Wt 186 lb (84.369 kg)  BMI 28.29 kg/m2  SpO2 92%   Intake/Output Summary (Last 24 hours) at 02/05/15 1732 Last data filed at 02/05/15 1700  Gross per 24 hour  Intake    410 ml  Output   1025 ml  Net   -615 ml    Awaiting step down bed  Continue current care  Sharrie Self C. Roxan Hockey, MD [facility] [phone]

## 2015-02-05 NOTE — Consult Note (Addendum)
WOC wound follow up Wound type: Vac dressing changed to midline sternal wound. Measurement: 11X2X2cm  Wound bed: 15% yellow slough, 85% red, some exposed bone visible to inner wound. Drainage (amount, consistency, odor) Mod amt thick red drainage in the cannister, no odor Periwound: Intact skin surrounding wound  Dressing procedure/placement/frequency: Applied one piece Mepitel contact layer and one piece black sponge to cont suction at 175m.  Covered sutures with gauze and Vac drape as it was previously to 3 locations below the Vac dressing. Pt medicated foa pain prior to procedure and tolerated with mod amt discomfort. Plan dressing change Wed. DJulien GirtMSN, RN, [place]

## 2015-02-05 NOTE — Progress Notes (Signed)
Patient ID: Randall Ali., male   DOB: 15-Jun-1940, 74 y.o.   MRN: [id number]         [facility]ious [facility]    Date of Admission:  01/24/2015   Total days of antibiotics 7        Day 5 ceftriaxone         Principal Problem:   Infection due to Enterobacter aerogenes Active Problems:   Postoperative wound infection   S/P CABG x 4   Status post right foot surgery   Acute respiratory failure with hypoxia   Delirium of mixed origin   . amiodarone  400 mg Oral Q12H   Followed by  . [START ON 02/10/2015] amiodarone  200 mg Oral Daily  . antiseptic oral rinse  7 mL Mouth Rinse 6 times per day  . bisacodyl  10 mg Rectal Daily  . cefTRIAXone (ROCEPHIN)  IV  2 g Intravenous Q24H  . chlorhexidine  15 mL Mouth Rinse BID  . enoxaparin (LOVENOX) injection  30 mg Subcutaneous Q24H  . furosemide  40 mg Intravenous Daily  . insulin aspart  0-15 Units Subcutaneous TID WC  . levalbuterol  0.63 mg Nebulization Q6H  . metoprolol  5 mg Intravenous Q12H  . mupirocin ointment   Nasal BID  . pantoprazole (PROTONIX) IV  40 mg Intravenous Q24H  . sodium chloride  10-40 mL Intracatheter Q12H  . thiamine  100 mg Intravenous Daily    SUBJECTIVE: He is feeling better. He has been up walking in the hall.  Review of Systems: Pertinent items are noted in HPI.  Past Medical History  Diagnosis Date  . CAD (coronary artery disease)     s/p inferior wall infarct in 10/01. has stent in Rt coronary artery. is due a stress myoview. does have some dyspnea on exertion  . SOB (shortness of breath) on exertion   . 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 Chest pain   . HLD (hyperlipidemia)     isdue followup lipids. on zocor 10 mg/day   . HTN (hypertension)   . Allergy     seasonal  . Cataract     removed  . Reflux esophagitis   . OSA (obstructive sleep apnea) 01/13/2015  . Erectile dysfunction   . Arthritis     all over- in general   . Cancer     skin, melanoma  .  GERD (gastroesophageal reflux disease)     pt. denies     Social History  Substance Use Topics  . Smoking status: Former Smoker -- 2.50 packs/day for 40 years    Types: Cigarettes    Quit date: 03/24/2000  . Smokeless tobacco: Never Used  . Alcohol Use: 6.0 oz/week    10 Shots of liquor per week     Comment: occasional- 2 times per week     Family History  Problem Relation Age of Onset  . Hypertension Mother   . Heart disease Mother   . Hypertension Father   . Diabetes Father   . Cancer Paternal Grandfather   . Heart disease Brother 62  . Colon cancer Neg Hx    No Known Allergies  OBJECTIVE: Filed Vitals:   02/05/15 0812 02/05/15 0900 02/05/15 1000 02/05/15 1100  BP:  112/59 121/76 126/73  Pulse:  90 89 81  Temp:    97.4 F (36.3 C)  TempSrc:      Resp:  '18 18 18  '$ Height:  Weight:      SpO2: 91% 88% 94% 93%   Body mass index is 28.29 kg/(m^2).  General: he is comfortable sitting up in bed visiting with family Lungs: clear Cor: regular S1 and S2 with no murmur Chest: Wound care nurse noted some exposed bone window VAC dressing was changed this morning  Lab Results Lab Results  Component Value Date   WBC 17.5* 02/05/2015   HGB 12.7* 02/05/2015   HCT 37.9* 02/05/2015   MCV 90.2 02/05/2015   PLT 441* 02/05/2015    Lab Results  Component Value Date   CREATININE 1.35* 02/05/2015   BUN 33* 02/05/2015   NA 144 02/05/2015   K 3.8 02/05/2015   CL 107 02/05/2015   CO2 27 02/05/2015    Lab Results  Component Value Date   ALT 63 02/02/2015   AST 50* 02/02/2015   ALKPHOS 94 02/02/2015   BILITOT 0.8 02/02/2015     Microbiology: Recent Results (from the past 240 hour(s))  Culture, expectorated sputum-assessment     Status: None   Collection Time: 01/29/15  1:30 PM  Result Value Ref Range Status   Specimen Description SPUTUM  Final   Special Requests Normal  Final   Sputum evaluation   Final    MICROSCOPIC FINDINGS SUGGEST THAT THIS SPECIMEN IS NOT  REPRESENTATIVE OF LOWER RESPIRATORY SECRETIONS. PLEASE RECOLLECT. Results Called to: Reece Levy AT 9935 01/29/15 BY L BENFIELD    Report Status 01/29/2015 FINAL  Final  Culture, Urine     Status: None   Collection Time: 01/29/15  1:30 PM  Result Value Ref Range Status   Specimen Description URINE, RANDOM  Final   Special Requests Normal  Final   Culture NO GROWTH 1 DAY  Final   Report Status 01/30/2015 FINAL  Final  Culture, blood (routine x 2)     Status: None   Collection Time: 01/29/15  2:21 PM  Result Value Ref Range Status   Specimen Description BLOOD LEFT ANTECUBITAL  Final   Special Requests BOTTLES DRAWN AEROBIC AND ANAEROBIC 10CC  Final   Culture  Setup Time   Final    GRAM NEGATIVE RODS ANAEROBIC BOTTLE ONLY CRITICAL RESULT CALLED TO, READ BACK BY AND VERIFIED WITH: Reece Levy AT [id number] ON [id number] BY Rhea Bleacher    Culture ENTEROBACTER AEROGENES  Final   Report Status 02/01/2015 FINAL  Final   Organism ID, Bacteria ENTEROBACTER AEROGENES  Final      Susceptibility   Enterobacter aerogenes - MIC*    CEFAZOLIN >=64 RESISTANT Resistant     CEFEPIME <=1 SENSITIVE Sensitive     CEFTAZIDIME <=1 SENSITIVE Sensitive     CEFTRIAXONE <=1 SENSITIVE Sensitive     CIPROFLOXACIN 1 INTERMEDIATE Intermediate     GENTAMICIN <=1 SENSITIVE Sensitive     IMIPENEM 2 SENSITIVE Sensitive     TRIMETH/SULFA <=20 SENSITIVE Sensitive     PIP/TAZO 8 SENSITIVE Sensitive     * ENTEROBACTER AEROGENES  Culture, blood (routine x 2)     Status: None   Collection Time: 01/29/15  2:30 PM  Result Value Ref Range Status   Specimen Description BLOOD LEFT ARM  Final   Special Requests BOTTLES DRAWN AEROBIC AND ANAEROBIC 5CC  Final   Culture  Setup Time   Final    GRAM NEGATIVE RODS GRAM POSITIVE COCCI IN BOTH AEROBIC AND ANAEROBIC BOTTLES CRITICAL RESULT CALLED TO, READ BACK BY AND VERIFIED WITH: WCOUNSELOR  0092 '@08'$ /23/16 MKELLY CRITICAL RESULT CALLED TO, READ BACK  BY AND VERIFIED WITH: Thomasenia Bottoms, RN  AT [phone] ON [id number] BY Rhea Bleacher NOTIFIED 2 ORGANISMS WERE PRESENT IN [facility] BOTTLE    Culture   Final    ENTEROBACTER AEROGENES SUSCEPTIBILITIES PERFORMED ON PREVIOUS CULTURE WITHIN THE LAST 5 DAYS. STAPHYLOCOCCUS SPECIES (COAGULASE NEGATIVE) THE SIGNIFICANCE OF ISOLATING THIS ORGANISM FROM A SINGLE SET OF BLOOD CULTURES WHEN MULTIPLE SETS ARE DRAWN IS UNCERTAIN. PLEASE NOTIFY THE MICROBIOLOGY DEPARTMENT WITHIN ONE WEEK IF SPECIATION AND SENSITIVITIES ARE REQUIRED.    Report Status 02/02/2015 FINAL  Final  Wound culture     Status: None   Collection Time: 01/30/15  7:51 AM  Result Value Ref Range Status   Specimen Description WOUND STERNUM  Final   Special Requests Normal  Final   Gram Stain   Final    NO WBC SEEN RARE SQUAMOUS EPITHELIAL CELLS PRESENT NO ORGANISMS SEEN Performed at [facility]    Culture   Final    FEW ENTEROBACTER AEROGENES Performed at [facility]    Report Status 02/02/2015 FINAL  Final   Organism ID, Bacteria ENTEROBACTER AEROGENES  Final      Susceptibility   Enterobacter aerogenes - MIC*    CEFAZOLIN >=64 RESISTANT Resistant     CEFEPIME <=1 SENSITIVE Sensitive     CEFTAZIDIME <=1 SENSITIVE Sensitive     CEFTRIAXONE <=1 SENSITIVE Sensitive     CIPROFLOXACIN <=0.25 SENSITIVE Sensitive     GENTAMICIN <=1 SENSITIVE Sensitive     IMIPENEM 2 INTERMEDIATE Intermediate     PIP/TAZO <=4 SENSITIVE Sensitive     TOBRAMYCIN <=1 SENSITIVE Sensitive     TRIMETH/SULFA <=20 SENSITIVE Sensitive     * FEW ENTEROBACTER AEROGENES  Gram stain     Status: None   Collection Time: 01/30/15  7:54 AM  Result Value Ref Range Status   Specimen Description WOUND STERNUM  Final   Special Requests Normal  Final   Gram Stain   Final    FEW WBC PRESENT, PREDOMINANTLY PMN FEW GRAM POSITIVE COCCI IN PAIRS RARE GRAM VARIABLE ROD    Report Status 01/30/2015 FINAL  Final  Culture, respiratory (NON-Expectorated)     Status: None   Collection Time: 01/30/15 11:51  AM  Result Value Ref Range Status   Specimen Description TRACHEAL ASPIRATE  Final   Special Requests NONE  Final   Gram Stain   Final    RARE WBC PRESENT, PREDOMINANTLY PMN NO SQUAMOUS EPITHELIAL CELLS SEEN RARE GRAM NEGATIVE RODS Performed at [facility]    Culture   Final    Non-Pathogenic Oropharyngeal-type Flora Isolated. Performed at [facility]    Report Status 02/01/2015 FINAL  Final  Wound culture     Status: None   Collection Time: 01/31/15  8:35 AM  Result Value Ref Range Status   Specimen Description WOUND  Final   Special Requests STERNUM  Final   Gram Stain   Final    ABUNDANT WBC PRESENT,BOTH PMN AND MONONUCLEAR NO SQUAMOUS EPITHELIAL CELLS SEEN NO ORGANISMS SEEN Performed at [facility]    Culture   Final    FEW ENTEROBACTER AEROGENES Performed at [facility]    Report Status 02/03/2015 FINAL  Final   Organism ID, Bacteria ENTEROBACTER AEROGENES  Final      Susceptibility   Enterobacter aerogenes - MIC*    CEFAZOLIN >=64 RESISTANT Resistant     CEFEPIME <=1 SENSITIVE Sensitive     CEFTAZIDIME <=1 SENSITIVE Sensitive  CEFTRIAXONE <=1 SENSITIVE Sensitive     CIPROFLOXACIN INTERMEDIATE      GENTAMICIN <=1 SENSITIVE Sensitive     IMIPENEM 1 SENSITIVE Sensitive     PIP/TAZO 8 SENSITIVE Sensitive     TOBRAMYCIN <=1 SENSITIVE Sensitive     TRIMETH/SULFA <=20 SENSITIVE Sensitive     * FEW ENTEROBACTER AEROGENES  Anaerobic culture     Status: None   Collection Time: 01/31/15  8:35 AM  Result Value Ref Range Status   Specimen Description WOUND  Final   Special Requests STERNUM  Final   Gram Stain   Final    ABUNDANT WBC PRESENT, PREDOMINANTLY PMN NO SQUAMOUS EPITHELIAL CELLS SEEN NO ORGANISMS SEEN Performed at [facility]    Culture   Final    NO ANAEROBES ISOLATED Performed at [facility]    Report Status 02/05/2015 FINAL  Final     ASSESSMENT: He is improving on therapy for her  Enterobacter sternal wound infection and bacteremia. Total duration of antibiotic therapy may need to be guided by the amount of exposed bone in wound healing.  PLAN: 1. Continue ceftriaxone. He needs a minimum of 14 days of antibiotic therapy. 2. I will followup on Wednesday, 02/07/2015  Michel Bickers, MD [facility] for [facility] [phone] pager   [phone] cell 02/05/2015, 12:27 PM

## 2015-02-05 NOTE — Progress Notes (Signed)
TCTS DAILY ICU PROGRESS NOTE                   [address]            [place] [address]          [phone]   12 Days Post-Op Procedure(s) (LRB): CORONARY ARTERY BYPASS GRAFTING x 4 (LIMA-LAD, SVG-Int 1- Int 2, SVG-PD) ENDOSCOPIC GREATER SAPHENOUS VEIN HARVEST LEFT LEG (N/A) TRANSESOPHAGEAL ECHOCARDIOGRAM (TEE) (N/A)  Total Length of Stay:  LOS: 12 days   Subjective: Walked twice already this am.  Main complaint is he doesn't like the D3 diet/thickened liquids.  "I'm not gonna eat that stuff."    Objective: Vital signs in last 24 hours: Temp:  [97.7 F (36.5 C)-98.5 F (36.9 C)] 97.8 F (36.6 C) (08/29 0402) Pulse Rate:  [83-124] 83 (08/29 0400) Cardiac Rhythm:  [-] Atrial fibrillation (08/29 0400) Resp:  [13-28] 17 (08/29 0400) BP: (105-146)/(33-112) 124/72 mmHg (08/29 0400) SpO2:  [87 %-100 %] 95 % (08/29 0400) Weight:  [186 lb (84.369 kg)] 186 lb (84.369 kg) (08/29 0500)  Filed Weights   02/03/15 0500 02/04/15 0500 02/05/15 0500  Weight: 194 lb 7.1 oz (88.2 kg) 188 lb 0.8 oz (85.3 kg) 186 lb (84.369 kg)  PRE-OPERATIVE WEIGHT: 95 kg  Weight change: -2 lb 0.8 oz (-0.931 kg)   Hemodynamic parameters for last 24 hours:    Intake/Output from previous day: 08/28 0701 - 08/29 0700 In: 324 [P.O.:690; I.V.:130; IV Piggyback:50] Out: 401 [Urine:825; Drains:50]  CBGs 171-110-138     Current Meds: Scheduled Meds: . amiodarone  400 mg Oral Q12H   Followed by  . [START ON 02/10/2015] amiodarone  200 mg Oral Daily  . antiseptic oral rinse  7 mL Mouth Rinse 6 times per day  . bisacodyl  10 mg Rectal Daily  . cefTRIAXone (ROCEPHIN)  IV  2 g Intravenous Q24H  . chlorhexidine  15 mL Mouth Rinse BID  . enoxaparin (LOVENOX) injection  30 mg Subcutaneous Q24H  . furosemide  40 mg Intravenous Daily  . insulin aspart  0-15 Units Subcutaneous TID WC  . levalbuterol  0.63 mg Nebulization Q6H  . metoprolol  5 mg Intravenous Q12H  . mupirocin ointment   Nasal  BID  . pantoprazole (PROTONIX) IV  40 mg Intravenous Q24H  . sodium chloride  10-40 mL Intracatheter Q12H  . thiamine  100 mg Intravenous Daily   Continuous Infusions: . sodium chloride 10 mL/hr at 02/02/15 0700   PRN Meds:.fentaNYL (SUBLIMAZE) injection, haloperidol lactate, LORazepam, metoprolol, ondansetron (ZOFRAN) IV, oxyCODONE, potassium chloride, potassium chloride, RESOURCE THICKENUP CLEAR, sodium chloride, traMADol   Physical Exam: General appearance: alert, cooperative and no distress Heart: regular rate and rhythm Lungs: clear to auscultation bilaterally Abdomen: soft, non-tender; bowel sounds normal; no masses,  no organomegaly Extremities: no edema, redness or tenderness in the calves or thighs Wound: Sternal wound VAC in place with no surrounding erythema    Lab Results: CBC: Recent Labs  02/03/15 0530 02/05/15 0225  WBC 15.0* 17.5*  HGB 12.4* 12.7*  HCT 37.1* 37.9*  PLT 381 441*   BMET:  Recent Labs  02/04/15 0240 02/05/15 0225  NA 143 144  K 4.0 3.8  CL 105 107  CO2 29 27  GLUCOSE 125* 138*  BUN 28* 33*  CREATININE 1.34* 1.35*  CALCIUM 8.9 9.2    PT/INR: No results for input(s): LABPROT, INR in the last 72 hours. Radiology: No results found.  Assessment/Plan: S/P Procedure(s) (LRB): CORONARY ARTERY BYPASS GRAFTING x 4 (LIMA-LAD, SVG-Int 1- Int 2, SVG-PD) ENDOSCOPIC GREATER SAPHENOUS VEIN HARVEST LEFT LEG (N/A) TRANSESOPHAGEAL ECHOCARDIOGRAM (TEE) (N/A)  CV- Has been in AF, presently in SR with some PVCs on telemetry. BPs generally stable. Continue po Amio. Hopefully can switch Lopressor from IV to po since he is now taking po's.  Enterobacter sternal wound infection- Continue wound VAC, Rocephin per ID. WBC up slightly today, no fever.  Will watch. Foley out this weekend, may need to check U/A if leukocytosis persists.  Nutrition- Continue D3 diet with thickened liquids for now.   Renal- Cr stable.  Continue pulm toilet,  ambulation.  Possibly ready for transfer to stepdown soon.   Randall Ali H 02/05/2015 7:44 AM

## 2015-02-05 NOTE — Progress Notes (Signed)
Physical Therapy Treatment Patient Details Name: Randall Ali. MRN: [id number] DOB: April 23, 1941 Today's Date: 02/05/2015    History of Present Illness Pt is a 74 y/o male with a recent diagnosis or 3 vessel CAD who was admitted for CABG 8/17. The procedure was without complication but his hospital couse was complicated by post-op AF, severe delerium, and dyspnea requiring BiPAP and intubation x2. Pt was extubated 8/25. sternal wound infection with dehiscence and VAC dressing    PT Comments    Patient progressing well. 400 ft on 2L O2 with only one standing rest (SaO2 not registering while ambulating--at rest after walk 94%). Continues to need education on sternal precautions, mobility, and use of DME (although he hopes to not need a device for walking when he goes home).    Follow Up Recommendations  Home health PT;Supervision/Assistance - 24 hour (pt reports multiple family members to assist for 24/7)     Equipment Recommendations  Rolling walker with 5" wheels;3in1 (PT)    Recommendations for Other Services       Precautions / Restrictions Precautions Precautions: Fall;Sternal Precaution Comments: Wound vac placed Restrictions Weight Bearing Restrictions: No (Sternal precautions)    Mobility  Bed Mobility Overal bed mobility: Needs Assistance Bed Mobility: Rolling;Sit to Sidelying Rolling: Min assist       Sit to sidelying: Mod assist;+2 for safety/equipment General bed mobility comments: vc to maintain sternal precautions; assist to raise legs onto bed  Transfers Overall transfer level: Needs assistance Equipment used: Rolling walker (2 wheeled) Transfers: Sit to/from Stand Sit to Stand: Min assist         General transfer comment: vc for safe use of RW and for technique to maintain sternal precautions  Ambulation/Gait Ambulation/Gait assistance: Min assist;+2 safety/equipment Ambulation Distance (Feet): 400 Feet Assistive device: Rolling walker (2  wheeled) Gait Pattern/deviations: Step-through pattern;Decreased stride length;Shuffle;Trunk flexed   Gait velocity interpretation: Below normal speed for age/gender General Gait Details: uses his shoes due to prior toe surgery; flexed at thoracic spine, hips and knees; frequent cues to stand more erect and look up   Stairs            Wheelchair Mobility    Modified Rankin (Stroke Patients Only)       Balance Overall balance assessment: Needs assistance Sitting-balance support: No upper extremity supported;Feet supported Sitting balance-Leahy Scale: Fair     Standing balance support: No upper extremity supported Standing balance-Leahy Scale: Fair Standing balance comment: required min assist for balance with weight shifts and no UE support               High Level Balance Comments: without UE support steps forward and back, alternating legs; steps in/out, althernating legs required min steadying assist    Cognition Arousal/Alertness: Awake/alert Behavior During Therapy: WFL for tasks assessed/performed Overall Cognitive Status: Within Functional Limits for tasks assessed                      Exercises      General Comments        Pertinent Vitals/Pain Pain Assessment: No/denies pain    Home Living                      Prior Function            PT Goals (current goals can now be found in the care plan section) Acute Rehab PT Goals Patient Stated Goal: walk without device Time For Goal Achievement:  02/15/15 Progress towards PT goals: Progressing toward goals    Frequency  Min 3X/week    PT Plan Current plan remains appropriate    Co-evaluation             End of Session Equipment Utilized During Treatment: Gait belt;Oxygen Activity Tolerance: Patient tolerated treatment well Patient left: with call bell/phone within reach;in bed;with SCD's reapplied     Time: 1346-1414 PT Time Calculation (min) (ACUTE ONLY): 28  min  Charges:  $Gait Training: 23-37 mins                    G Codes:      Randall Ali 02-09-15, 8:04 PM Pager [phone]

## 2015-02-05 NOTE — Progress Notes (Signed)
TCTS DAILY ICU PROGRESS NOTE                   [address]            ,[place] [address]          [phone]   12 Days Post-Op Procedure(s) (LRB): CORONARY ARTERY BYPASS GRAFTING x 4 (LIMA-LAD, SVG-Int 1- Int 2, SVG-PD) ENDOSCOPIC GREATER SAPHENOUS VEIN HARVEST LEFT LEG (N/A) TRANSESOPHAGEAL ECHOCARDIOGRAM (TEE) (N/A)  Total Length of Stay:  LOS: 12 days   Subjective: Walked twice already this am.  Main complaint is he doesn't like the D3 diet/thickened liquids.  "I'm not gonna eat that stuff."    Objective: Vital signs in last 24 hours: Temp:  [97.4 F (36.3 C)-98.5 F (36.9 C)] 97.4 F (36.3 C) (08/29 1100) Pulse Rate:  [77-117] 77 (08/29 1200) Cardiac Rhythm:  [-] Atrial fibrillation (08/29 1200) Resp:  [13-25] 18 (08/29 1200) BP: (110-140)/(33-112) 118/67 mmHg (08/29 1200) SpO2:  [87 %-98 %] 97 % (08/29 1415) Weight:  [186 lb (84.369 kg)] 186 lb (84.369 kg) (08/29 0500)  Filed Weights   02/03/15 0500 02/04/15 0500 02/05/15 0500  Weight: 194 lb 7.1 oz (88.2 kg) 188 lb 0.8 oz (85.3 kg) 186 lb (84.369 kg)  PRE-OPERATIVE WEIGHT: 95 kg  Weight change: -2 lb 0.8 oz (-0.931 kg)   Hemodynamic parameters for last 24 hours:    Intake/Output from previous day: 08/28 0701 - 08/29 0700 In: 491 [P.O.:690; I.V.:130; IV Piggyback:50] Out: 791 [Urine:825; Drains:50]  CBGs 171-110-138  Total I/O In: 50 [IV Piggyback:50] Out: 400 [Urine:400]  Current Meds: Scheduled Meds: . amiodarone  400 mg Oral Q12H   Followed by  . [START ON 02/10/2015] amiodarone  200 mg Oral Daily  . antiseptic oral rinse  7 mL Mouth Rinse 6 times per day  . bisacodyl  10 mg Rectal Daily  . cefTRIAXone (ROCEPHIN)  IV  2 g Intravenous Q24H  . chlorhexidine  15 mL Mouth Rinse BID  . enoxaparin (LOVENOX) injection  30 mg Subcutaneous Q24H  . furosemide  40 mg Intravenous Daily  . insulin aspart  0-15 Units Subcutaneous TID WC  . levalbuterol  0.63 mg Nebulization Q6H  . metoprolol   5 mg Intravenous Q12H  . mupirocin ointment   Nasal BID  . pantoprazole (PROTONIX) IV  40 mg Intravenous Q24H  . sodium chloride  10-40 mL Intracatheter Q12H  . thiamine  100 mg Intravenous Daily   Continuous Infusions: . sodium chloride 10 mL/hr at 02/02/15 0700   PRN Meds:.fentaNYL (SUBLIMAZE) injection, haloperidol lactate, LORazepam, metoprolol, ondansetron (ZOFRAN) IV, oxyCODONE, potassium chloride, potassium chloride, RESOURCE THICKENUP CLEAR, sodium chloride, traMADol   Physical Exam: General appearance: alert, cooperative and no distress Heart: regular rate and rhythm Lungs: clear to auscultation bilaterally Abdomen: soft, non-tender; bowel sounds normal; no masses,  no organomegaly Extremities: no edema, redness or tenderness in the calves or thighs Wound: Sternal wound VAC in place with no surrounding erythema    Lab Results: CBC:  Recent Labs  02/03/15 0530 02/05/15 0225  WBC 15.0* 17.5*  HGB 12.4* 12.7*  HCT 37.1* 37.9*  PLT 381 441*   BMET:   Recent Labs  02/04/15 0240 02/05/15 0225  NA 143 144  K 4.0 3.8  CL 105 107  CO2 29 27  GLUCOSE 125* 138*  BUN 28* 33*  CREATININE 1.34* 1.35*  CALCIUM 8.9 9.2    PT/INR: No results for input(s): LABPROT, INR in the  last 72 hours. Radiology: No results found.   Assessment/Plan: S/P Procedure(s) (LRB): CORONARY ARTERY BYPASS GRAFTING x 4 (LIMA-LAD, SVG-Int 1- Int 2, SVG-PD) ENDOSCOPIC GREATER SAPHENOUS VEIN HARVEST LEFT LEG (N/A) TRANSESOPHAGEAL ECHOCARDIOGRAM (TEE) (N/A)  CV- Has been in AF, presently in SR with some PVCs on telemetry. BPs generally stable. Continue po Amio. Hopefully can switch Lopressor from IV to po since he is now taking po's.  Enterobacter sternal wound infection- Continue wound VAC, Rocephin per ID. WBC up slightly today, no fever.  Will watch. Foley out this weekend, may need to check U/A if leukocytosis persists.  Nutrition- Continue D3 diet with thickened liquids for now.    Renal- Cr stable.  Continue pulm toilet, ambulation.  Possibly ready for transfer to stepdown soon.  Pt c/o insomnia so will provide HS benadryl Wound VAC changed by wound nurse who reported improvement Ready for stepdown bed- ambulates well  patient examined and medical record reviewed,agree with above note. Tharon Aquas Trigt III 02/05/2015   Tharon Aquas Trigt III 02/05/2015 2:29 PM

## 2015-02-05 NOTE — Care Management Note (Signed)
Case Management Note  Patient Details  Name: Randall Ali. MRN: [id number] Date of Birth: [birth year]  Subjective/Objective:     Patient lives at home alone.  Daughter and one of his three sisters live within a block away. This sister also has a daughter that will be assisting.  Plan for discharge is to go to his house, one of his sisters or niece will be with him during the day and his daughter will be with him at night.  Informed patient he may have the wound vac on when he goes home, feels his family can assist with this.  Continues on oxygen at this time.  Will continue to follow for progression and needs on discharge.  VAC form on chart will need completed if plan is for discharge with VAC.  Will also follow to see if will need IV antibiotics on discharge.                  Action/Plan:   Expected Discharge Date:                  Expected Discharge Plan:  [place]  In-House Referral:     Discharge planning Services  CM Consult  Post Acute Care Choice:    Choice offered to:     DME Arranged:    DME Agency:     HH Arranged:    [place] Agency:     Status of Service:  In process, will continue to follow  Medicare Important Message Given:  Yes-second notification given Date Medicare IM Given:    Medicare IM give by:    Date Additional Medicare IM Given:    Additional Medicare Important Message give by:     If discussed at [place] of Stay Meetings, dates discussed:    Additional Comments:  Vergie Living, RN 02/05/2015, 1:52 PM

## 2015-02-06 LAB — BASIC METABOLIC PANEL
Anion gap: 9 (ref 5–15)
BUN: 33 mg/dL — ABNORMAL HIGH (ref 6–20)
CO2: 30 mmol/L (ref 22–32)
Calcium: 8.9 mg/dL (ref 8.9–10.3)
Chloride: 107 mmol/L (ref 101–111)
Creatinine, Ser: 1.42 mg/dL — ABNORMAL HIGH (ref 0.61–1.24)
GFR calc Af Amer: 55 mL/min — ABNORMAL LOW (ref >60)
GFR calc non Af Amer: 48 mL/min — ABNORMAL LOW (ref >60)
Glucose, Bld: 130 mg/dL — ABNORMAL HIGH (ref 65–99)
Potassium: 3.4 mmol/L — ABNORMAL LOW (ref 3.5–5.1)
Sodium: 146 mmol/L — ABNORMAL HIGH (ref 135–145)

## 2015-02-06 LAB — CBC
HCT: 36.6 % — ABNORMAL LOW (ref 39.0–52.0)
Hemoglobin: 11.8 g/dL — ABNORMAL LOW (ref 13.0–17.0)
MCH: 29.5 pg (ref 26.0–34.0)
MCHC: 32.2 g/dL (ref 30.0–36.0)
MCV: 91.5 fL (ref 78.0–100.0)
Platelets: 399 10*3/uL (ref 150–400)
RBC: 4 MIL/uL — ABNORMAL LOW (ref 4.22–5.81)
RDW: 14.4 % (ref 11.5–15.5)
WBC: 12.6 10*3/uL — ABNORMAL HIGH (ref 4.0–10.5)

## 2015-02-06 LAB — GLUCOSE, CAPILLARY
Glucose-Capillary: 115 mg/dL — ABNORMAL HIGH (ref 65–99)
Glucose-Capillary: 159 mg/dL — ABNORMAL HIGH (ref 65–99)
Glucose-Capillary: 119 mg/dL — ABNORMAL HIGH (ref 65–99)
Glucose-Capillary: 117 mg/dL — ABNORMAL HIGH (ref 65–99)

## 2015-02-06 MED ORDER — VITAMIN B-1 100 MG PO TABS
100.0000 mg | ORAL_TABLET | Freq: Every day | ORAL | Status: DC
  Administered 2015-02-07 – 2015-02-09 (×3): 100 mg via ORAL
  Filled 2015-02-06 (×4): qty 1

## 2015-02-06 MED ORDER — SODIUM CHLORIDE 0.9 % IV SOLN: 250.0000 mL | INTRAVENOUS | Status: DC | PRN

## 2015-02-06 MED ORDER — PRO-STAT SUGAR FREE PO LIQD
30.0000 mL | Freq: Three times a day (TID) | ORAL | Status: DC
  Administered 2015-02-07 – 2015-02-09 (×8): 30 mL via ORAL
  Filled 2015-02-06 (×12): qty 30

## 2015-02-06 MED ORDER — INFLUENZA VAC SPLIT QUAD 0.5 ML IM SUSY: 0.5000 mL | PREFILLED_SYRINGE | INTRAMUSCULAR | Status: DC

## 2015-02-06 MED ORDER — MOVING RIGHT ALONG BOOK
Freq: Once | Status: DC
  Filled 2015-02-06: qty 1

## 2015-02-06 MED ORDER — SODIUM CHLORIDE 0.9 % IJ SOLN: 3.0000 mL | INTRAMUSCULAR | Status: DC | PRN

## 2015-02-06 MED ORDER — BISACODYL 5 MG PO TBEC
10.0000 mg | DELAYED_RELEASE_TABLET | Freq: Every day | ORAL | Status: DC
  Administered 2015-02-06: 10 mg via ORAL
  Filled 2015-02-06: qty 2

## 2015-02-06 MED ORDER — ACETAMINOPHEN 500 MG PO TABS
1000.0000 mg | ORAL_TABLET | Freq: Four times a day (QID) | ORAL | Status: DC | PRN
  Administered 2015-02-07 – 2015-02-08 (×3): 1000 mg via ORAL
  Filled 2015-02-06 (×3): qty 2

## 2015-02-06 MED ORDER — SODIUM CHLORIDE 0.9 % IJ SOLN
3.0000 mL | Freq: Two times a day (BID) | INTRAMUSCULAR | Status: DC
Start: 2015-02-06 — End: 2015-02-09
  Administered 2015-02-07 – 2015-02-09 (×2): 3 mL via INTRAVENOUS

## 2015-02-06 MED ORDER — PANTOPRAZOLE SODIUM 40 MG PO TBEC
40.0000 mg | DELAYED_RELEASE_TABLET | Freq: Every day | ORAL | Status: DC
  Administered 2015-02-07 – 2015-02-09 (×3): 40 mg via ORAL
  Filled 2015-02-06 (×3): qty 1

## 2015-02-06 MED ORDER — POTASSIUM CHLORIDE 10 MEQ/50ML IV SOLN
10.0000 meq | INTRAVENOUS | Status: AC
  Administered 2015-02-06 (×2): 10 meq via INTRAVENOUS

## 2015-02-06 MED ORDER — LEVALBUTEROL HCL 0.63 MG/3ML IN NEBU: 0.6300 mg | INHALATION_SOLUTION | Freq: Four times a day (QID) | RESPIRATORY_TRACT | Status: DC | PRN

## 2015-02-06 NOTE — Progress Notes (Signed)
13 Days Post-Op Procedure(s) (LRB): CORONARY ARTERY BYPASS GRAFTING x 4 (LIMA-LAD, SVG-Int 1- Int 2, SVG-PD) ENDOSCOPIC GREATER SAPHENOUS VEIN HARVEST LEFT LEG (N/A) TRANSESOPHAGEAL ECHOCARDIOGRAM (TEE) (N/A) Subjective: "i'm not having a good day" C/o sinus headache  Objective: Vital signs in last 24 hours: Temp:  [97.5 F (36.4 C)-98.7 F (37.1 C)] 98 F (36.7 C) (08/30 1515) Pulse Rate:  [50-94] 84 (08/30 1700) Cardiac Rhythm:  [-] Normal sinus rhythm (08/30 1700) Resp:  [11-27] 23 (08/30 1700) BP: (97-149)/(47-95) 126/95 mmHg (08/30 1700) SpO2:  [87 %-99 %] 92 % (08/30 1700) Weight:  [183 lb 13.8 oz (83.4 kg)] 183 lb 13.8 oz (83.4 kg) (08/30 0500)  Hemodynamic parameters for last 24 hours:    Intake/Output from previous day: 08/29 0701 - 08/30 0700 In: 440 [P.O.:390; IV Piggyback:50] Out: 1225 [ZWCHE:5277; Drains:50] Intake/Output this shift: Total I/O In: 340 [P.O.:240; IV Piggyback:100] Out: 925 [Urine:925]  General appearance: alert and no distress Neurologic: intact Heart: regular rate and rhythm Lungs: clear to auscultation bilaterally Wound: vac in place  Lab Results:  Recent Labs  02/05/15 0225 02/06/15 0430  WBC 17.5* 12.6*  HGB 12.7* 11.8*  HCT 37.9* 36.6*  PLT 441* 399   BMET:  Recent Labs  02/05/15 0225 02/06/15 0430  NA 144 146*  K 3.8 3.4*  CL 107 107  CO2 27 30  GLUCOSE 138* 130*  BUN 33* 33*  CREATININE 1.35* 1.42*  CALCIUM 9.2 8.9    PT/INR: No results for input(s): LABPROT, INR in the last 72 hours. ABG    Component Value Date/Time   PHART 7.513* 01/30/2015 0523   HCO3 30.2* 01/30/2015 0523   TCO2 31.4 01/30/2015 0523   ACIDBASEDEF 4.0* 01/24/2015 2031   O2SAT 99.1 01/30/2015 0523   CBG (last 3)   Recent Labs  02/06/15 0747 02/06/15 1138 02/06/15 1516  GLUCAP 115* 159* 119*    Assessment/Plan: S/P Procedure(s) (LRB): CORONARY ARTERY BYPASS GRAFTING x 4 (LIMA-LAD, SVG-Int 1- Int 2, SVG-PD) ENDOSCOPIC GREATER  SAPHENOUS VEIN HARVEST LEFT LEG (N/A) TRANSESOPHAGEAL ECHOCARDIOGRAM (TEE) (N/A) Plan for transfer to step-down: see transfer orders   He is clinically stable  He continues to complain of using thick-it in his food. He is having a repeat swallow tomorrow  He had some nausea following a large bowel movement before lunch today  Hypokalemia was treated with IV KCl  Remains on ceftriaxone for sternal wound  VAC change scheduled for tomorrow  Transfer to [facility] when bed available   LOS: 13 days    Melrose Nakayama 02/06/2015

## 2015-02-06 NOTE — Care Management Important Message (Signed)
Important Message  Patient Details  Name: Nile Prisk. MRN: [id number] Date of Birth: 08-16-1940   Medicare Important Message Given:  Yes-third notification given    Nathen May 02/06/2015, 2:09 [place] Message  Patient Details  Name: Kashon Kraynak. MRN: [id number] Date of Birth: Mar 09, 1941   Medicare Important Message Given:  Yes-third notification given    Nathen May 02/06/2015, 2:09 PM

## 2015-02-06 NOTE — Progress Notes (Addendum)
Nutrition Follow-up  DOCUMENTATION CODES:   Obesity unspecified  INTERVENTION:   Magic cup TID with meals, each supplement provides 290 kcal and 9 grams of protein  Prostat liquid protein po 30 ml TID with meals, each supplement provides 100 kcal, 15 grams protein  NEW NUTRITION DIAGNOSIS:   Increased nutrient needs related to wound healing as evidenced by estimated needs, ongoing  GOAL:   Patient will meet greater than or equal to 90% of their needs, currently unmet  MONITOR:   PO intake, Supplement acceptance, Labs, Weight trends, Skin, I & O's  ASSESSMENT:   74 y.o. Male with PMH of HTN, hyperlipidemia, CAD s/p myocardial infarction in 2001 with stent placed presented to the ED intermittent chest pain, palpitations.  Patient s/p procedure 8/17: CORONARY ARTERY BYPASS GRAFTING x 4  Patient extubated 8/25.  Pt sleeping upon RD visit.  TPN discontinued.  S/p MBSS per Speech Path 8/26.  Dx with moderate pharyngeal phase dysphagia.  PO intake poor at 0-30% per flowsheet records.  Would benefit from addition of oral nutrition supplements & liquid protein.  RD to order.  CWOCN notes reviewed.  Receiving VAC changes to midline sternal wound.  Diet Order:  DIET DYS 3 Room service appropriate?: Yes; Fluid consistency:: Nectar Thick  Skin:  Wound (see comment) (midline sternal wound)  Last BM:  8/27  Height:   Ht Readings from Last 1 Encounters:  01/30/15 '5\' 8"'$  (1.727 m)    Weight:   Wt Readings from Last 1 Encounters:  02/06/15 183 lb 13.8 oz (83.4 kg)    Ideal Body Weight:  70 kg  BMI:  Body mass index is 27.96 kg/(m^2).  Re-estimated Nutritional Needs:   Kcal:  2200-2400  Protein:  130-140 gm  Fluid:  per MD  EDUCATION NEEDS:   No education needs identified at this time  Arthur Holms, RD, LDN Pager #: [phone] After-Hours Pager #: [phone]

## 2015-02-06 NOTE — Plan of Care (Signed)
Wound vac alarming "clogged", wound RN called. To change out cannister first, cannister ordered.

## 2015-02-06 NOTE — Progress Notes (Signed)
T[facility] BRIEF SICU PROGRESS NOTE  13 Days Post-Op  S/P Procedure(s) (LRB): CORONARY ARTERY BYPASS GRAFTING x 4 (LIMA-LAD, SVG-Int 1- Int 2, SVG-PD) ENDOSCOPIC GREATER SAPHENOUS VEIN HARVEST LEFT LEG (N/A) TRANSESOPHAGEAL ECHOCARDIOGRAM (TEE) (N/A)   Stable day Maintaining NSR Awaiting bed on step down unit for transfer  Plan:  continue current plan  Rexene Alberts 02/06/2015 7:46 PM

## 2015-02-07 LAB — CBC
HCT: 39.5 % (ref 39.0–52.0)
Hemoglobin: 12.6 g/dL — ABNORMAL LOW (ref 13.0–17.0)
MCH: 29.6 pg (ref 26.0–34.0)
MCHC: 31.9 g/dL (ref 30.0–36.0)
MCV: 92.9 fL (ref 78.0–100.0)
Platelets: 433 10*3/uL — ABNORMAL HIGH (ref 150–400)
RBC: 4.25 MIL/uL (ref 4.22–5.81)
RDW: 14.3 % (ref 11.5–15.5)
WBC: 13.4 10*3/uL — ABNORMAL HIGH (ref 4.0–10.5)

## 2015-02-07 LAB — BASIC METABOLIC PANEL
Anion gap: 9 (ref 5–15)
BUN: 32 mg/dL — ABNORMAL HIGH (ref 6–20)
CO2: 29 mmol/L (ref 22–32)
Calcium: 9.3 mg/dL (ref 8.9–10.3)
Chloride: 108 mmol/L (ref 101–111)
Creatinine, Ser: 1.45 mg/dL — ABNORMAL HIGH (ref 0.61–1.24)
GFR calc Af Amer: 54 mL/min — ABNORMAL LOW (ref >60)
GFR calc non Af Amer: 47 mL/min — ABNORMAL LOW (ref >60)
Glucose, Bld: 115 mg/dL — ABNORMAL HIGH (ref 65–99)
Potassium: 4 mmol/L (ref 3.5–5.1)
Sodium: 146 mmol/L — ABNORMAL HIGH (ref 135–145)

## 2015-02-07 LAB — GLUCOSE, CAPILLARY
Glucose-Capillary: 118 mg/dL — ABNORMAL HIGH (ref 65–99)
Glucose-Capillary: 137 mg/dL — ABNORMAL HIGH (ref 65–99)
Glucose-Capillary: 125 mg/dL — ABNORMAL HIGH (ref 65–99)

## 2015-02-07 MED ORDER — OXYMETAZOLINE HCL 0.05 % NA SOLN
1.0000 | Freq: Two times a day (BID) | NASAL | Status: DC
  Administered 2015-02-07 – 2015-02-09 (×4): 1 via NASAL
  Filled 2015-02-07: qty 15

## 2015-02-07 MED ORDER — MORPHINE SULFATE (PF) 2 MG/ML IV SOLN
2.0000 mg | INTRAVENOUS | Status: DC | PRN
  Administered 2015-02-07: 2 mg via INTRAVENOUS
  Filled 2015-02-07: qty 1

## 2015-02-07 MED ORDER — FUROSEMIDE 40 MG PO TABS
40.0000 mg | ORAL_TABLET | Freq: Every day | ORAL | Status: DC
  Administered 2015-02-08 – 2015-02-09 (×2): 40 mg via ORAL
  Filled 2015-02-07 (×2): qty 1

## 2015-02-07 NOTE — Evaluation (Signed)
Physical Therapy Evaluation Patient Details Name: Randall Ali. MRN: [id number] DOB: [birth year] Today's Date: 02/07/2015   History of Present Illness  Pt is a 74 y/o male with a recent diagnosis or 3 vessel CAD who was admitted for CABG 8/17. The procedure was without complication but his hospital couse was complicated by post-op AF, severe delerium, and dyspnea requiring BiPAP and intubation x2. Pt was extubated 8/25. sternal wound infection with dehiscence and VAC dressing  Clinical Impression  Pt admitted with above diagnosis. Pt currently with functional limitations due to the deficits listed below (see PT Problem List). At the time of PT eval pt was able to demonstrate mobility with min assist to stand, and min guard progressing to supervision for ambulation. Pt focused on working with speech therapy after our session and declines further therapeutic exercise. Pt will benefit from skilled PT to increase their independence and safety with mobility to allow discharge to the venue listed below.      Follow Up Recommendations Home health PT;Supervision for mobility/OOB    Equipment Recommendations  Rolling walker with 5" wheels;3in1 (PT)    Recommendations for Other Services       Precautions / Restrictions Precautions Precautions: Fall;Sternal Precaution Comments: Wound vac placed Restrictions Weight Bearing Restrictions: Yes (Sternal)      Mobility  Bed Mobility               General bed mobility comments: Pt sitting up in recliner chair upon PT arrival.   Transfers Overall transfer level: Needs assistance Equipment used: Rolling walker (2 wheeled) Transfers: Sit to/from Stand Sit to Stand: Min assist         General transfer comment: VC's to maintain sternal precautions. Pt required min assist with gait belt to achieve full standing and gain/maintain standing balance.   Ambulation/Gait Ambulation/Gait assistance: Min guard;Supervision Ambulation Distance  (Feet): 425 Feet Assistive device: Rolling walker (2 wheeled) Gait Pattern/deviations: Step-through pattern;Decreased stride length;Trunk flexed Gait velocity: Decreased Gait velocity interpretation: Below normal speed for age/gender General Gait Details: VC's for improved posture. Pt was able to maintain for ~10 seconds at a time and then reverted back to a flexed posture. Was willing to increase walking distance and did not demonstrate any LOB or DOE. Initially min guard was required and pt quickly progressed to supervision status.   Stairs            Wheelchair Mobility    Modified Rankin (Stroke Patients Only)       Balance Overall balance assessment: Needs assistance Sitting-balance support: Feet supported;No upper extremity supported Sitting balance-Leahy Scale: Fair     Standing balance support: No upper extremity supported Standing balance-Leahy Scale: Fair Standing balance comment: Standing EOB before initiating stand>sit.                              Pertinent Vitals/Pain Pain Assessment: No/denies pain Faces Pain Scale: Hurts a little bit Pain Location: chest    Home Living                        Prior Function                 Hand Dominance        Extremity/Trunk Assessment                         Communication  Cognition Arousal/Alertness: Awake/alert Behavior During Therapy: WFL for tasks assessed/performed Overall Cognitive Status: Within Functional Limits for tasks assessed                      General Comments      Exercises General Exercises - Lower Extremity Long Arc Quad: 15 reps;Both      Assessment/Plan    PT Assessment    PT Diagnosis     PT Problem List    PT Treatment Interventions     PT Goals (Current goals can be found in the Care Plan section) Acute Rehab PT Goals Patient Stated Goal: walk without device PT Goal Formulation: With patient Time For Goal Achievement:  02/15/15 Potential to Achieve Goals: Good    Frequency Min 3X/week   Barriers to discharge        Co-evaluation               End of Session Equipment Utilized During Treatment: Gait belt;Oxygen Activity Tolerance: Patient tolerated treatment well Patient left: with call bell/phone within reach;in bed;with SCD's reapplied Nurse Communication: Mobility status         Time: 2297-9892 PT Time Calculation (min) (ACUTE ONLY): 23 min   Charges:     PT Treatments $Gait Training: 8-22 mins $Therapeutic Activity: 8-22 mins   PT G Codes:        Rolinda Roan 02-20-15, 10:59 AM  Rolinda Roan, PT, DPT Acute Rehabilitation Services Pager: [phone]

## 2015-02-07 NOTE — Progress Notes (Signed)
Speech Language Pathology Treatment: Dysphagia  Patient Details Name: Randall Ali. MRN: [id number] DOB: 07-04-1940 Today's Date: 02/07/2015 Time: 5035-4656 SLP Time Calculation (min) (ACUTE ONLY): 15 min  Assessment / Plan / Recommendation Clinical Impression  Pt showed signs of penetration (immediate throat clear, cough, and mild wet vocal quality) with thin liquids. SLP cued pt to clear throat and swallow twice. Pt was effective with these strategies, which is consistent with previous MBS. SLP recommends upgrading diet to regular, thin liquid with mild to moderate risk of aspiration in order to observe diet tolerance and use of strategies before pt's expected discharge; pt is unlikely to use thickened liquids at home.    HPI Other Pertinent Information: 74 year old male with recent dx of 3 vessel CAD was admitted for CABG 8/17. The procedure was without complication. Hospital course has been complicated by post-op AF, severe delirium requiring precedex, and dyspnea requiring BiPAP for hypoxia. CXR concerning for edema. Intubated 8/23, extubated 8/25.    Pertinent Vitals Pain Assessment: Faces Faces Pain Scale: Hurts a little bit Pain Location: chest  SLP Plan  Goals updated    Recommendations Diet recommendations: Regular;Thin liquid Liquids provided via: Cup;No straw Medication Administration: Crushed with puree Supervision: Patient able to self feed;Intermittent supervision to cue for compensatory strategies Compensations: Small sips/bites;Clear throat intermittently;Multiple dry swallows after each bite/sip Postural Changes and/or Swallow Maneuvers: Seated upright 90 degrees              Oral Care Recommendations: Oral care BID Plan: Goals updated    [place], Student-SLP  Lanier Ensign 02/07/2015, 10:56 AM

## 2015-02-07 NOTE — Consult Note (Addendum)
WOC wound follow up Wound type: Vac dressing changed to midline sternal wound. Measurement: 11X2X4cm   Wound bed: 10% yellow slough, 90% red, some exposed bone visible to inner wound; fluctuant at 6:00 o'clock when probed with a swab and this appears to be where thick drainage is located. Drainage (amount, consistency, odor) Mod amt thick yellow drainage in the cannister has clogged previous filter.  New cannister ordered for bedside nurse to change, no odor Periwound: Intact skin surrounding wound  Dressing procedure/placement/frequency: Applied one piece Mepitel contact layer and one piece black sponge to cont suction at 189m.Pt medicated for pain prior to procedure and tolerated with mod amt discomfort. Plan dressing change  Fri. DJulien GirtMSN, RN, [place]

## 2015-02-07 NOTE — Care Management Note (Signed)
Case Management Note  Patient Details  Name: Randall Ali. MRN: [id number] Date of Birth: [birth year]  Subjective/Objective:     Patient lives at home alone.  Daughter and one of his three sisters live within a block away. This sister also has a daughter that will be assisting.  Plan for discharge is to go to his house, one of his sisters or niece will be with him during the day and his daughter will be with him at night.  Informed patient he may have the wound vac on when he goes home, feels his family can assist with this.  Continues on oxygen at this time.  Will continue to follow for progression and needs on discharge.  VAC form on chart will need completed if plan is for discharge with VAC.  Will also follow to see if will need IV antibiotics on discharge.                  Action/Plan:   Expected Discharge Date:                  Expected Discharge Plan:  [place]  In-House Referral:     Discharge planning Services  CM Consult  Post Acute Care Choice:    Choice offered to:     DME Arranged:    DME Agency:     HH Arranged:    [place] Agency:     Status of Service:  In process, will continue to follow  Medicare Important Message Given:  Yes-third notification given Date Medicare IM Given:    Medicare IM give by:    Date Additional Medicare IM Given:    Additional Medicare Important Message give by:     If discussed at [place] of Stay Meetings, dates discussed:    Additional Comments:  Plan for ?? IV antibiotics at home - ID to talk with surgeon to decide, already has picc line.  Continues with VAC and will need at home per nurse.  [place] agency list for [employer] given to patient, states would like daughters and sisters input on agency.  Vergie Living, RN 02/07/2015, 3:01 PM

## 2015-02-07 NOTE — Progress Notes (Signed)
Patient ID: Randall Ali., male   DOB: 1940-10-16, 74 y.o.   MRN: [id number]         [facility]    Date of Admission:  01/24/2015   Total days of antibiotics 9        Day 7 ceftriaxone         Principal Problem:   Infection due to Enterobacter aerogenes Active Problems:   Postoperative wound infection   S/P CABG x 4   Status post right foot surgery   Acute respiratory failure with hypoxia   Delirium of mixed origin   . amiodarone  400 mg Oral Q12H   Followed by  . [START ON 02/10/2015] amiodarone  200 mg Oral Daily  . bisacodyl  10 mg Oral Daily  . cefTRIAXone (ROCEPHIN)  IV  2 g Intravenous Q24H  . enoxaparin (LOVENOX) injection  30 mg Subcutaneous Q24H  . feeding supplement (PRO-STAT SUGAR FREE 64)  30 mL Oral TID  . insulin aspart  0-15 Units Subcutaneous TID WC  . metoprolol tartrate  12.5 mg Oral BID  . moving right along book   Does not apply Once  . pantoprazole  40 mg Oral Daily  . sodium chloride  10-40 mL Intracatheter Q12H  . sodium chloride  3 mL Intravenous Q12H  . thiamine  100 mg Oral Daily    OBJECTIVE: Filed Vitals:   02/07/15 1000 02/07/15 1100 02/07/15 1126 02/07/15 1200  BP:      Pulse: 87 80  75  Temp:   97.8 F (36.6 C)   TempSrc:   Oral   Resp: '21 18  17  '$ Height:      Weight:      SpO2: 95% 94%  95%   Body mass index is 27.16 kg/(m^2).  General: he is sound asleep. I did not wake him Chest: Wound care nurse noted some exposed bone when the [facility] dressing was changed this morning. She also noted that the wound drainage is thicker and mentioned a "fluctuant" area  Lab Results Lab Results  Component Value Date   WBC 13.4* 02/07/2015   HGB 12.6* 02/07/2015   HCT 39.5 02/07/2015   MCV 92.9 02/07/2015   PLT 433* 02/07/2015    Lab Results  Component Value Date   CREATININE 1.45* 02/07/2015   BUN 32* 02/07/2015   NA 146* 02/07/2015   K 4.0 02/07/2015   CL 108 02/07/2015   CO2 29 02/07/2015    Lab  Results  Component Value Date   ALT 63 02/02/2015   AST 50* 02/02/2015   ALKPHOS 94 02/02/2015   BILITOT 0.8 02/02/2015     Microbiology: Recent Results (from the past 240 hour(s))  Culture, expectorated sputum-assessment     Status: None   Collection Time: 01/29/15  1:30 PM  Result Value Ref Range Status   Specimen Description SPUTUM  Final   Special Requests Normal  Final   Sputum evaluation   Final    MICROSCOPIC FINDINGS SUGGEST THAT THIS SPECIMEN IS NOT REPRESENTATIVE OF LOWER RESPIRATORY SECRETIONS. PLEASE RECOLLECT. Results Called to: Reece Levy AT [id number] 01/29/15 BY L BENFIELD    Report Status 01/29/2015 FINAL  Final  Culture, Urine     Status: None   Collection Time: 01/29/15  1:30 PM  Result Value Ref Range Status   Specimen Description URINE, RANDOM  Final   Special Requests Normal  Final   Culture NO GROWTH 1 DAY  Final   Report  Status 01/30/2015 FINAL  Final  Culture, blood (routine x 2)     Status: None   Collection Time: 01/29/15  2:21 PM  Result Value Ref Range Status   Specimen Description BLOOD LEFT ANTECUBITAL  Final   Special Requests BOTTLES DRAWN AEROBIC AND ANAEROBIC 10CC  Final   Culture  Setup Time   Final    GRAM NEGATIVE RODS ANAEROBIC BOTTLE ONLY CRITICAL RESULT CALLED TO, READ BACK BY AND VERIFIED WITH: Reece Levy AT [id number] ON [id number] BY Rhea Bleacher    Culture ENTEROBACTER AEROGENES  Final   Report Status 02/01/2015 FINAL  Final   Organism ID, Bacteria ENTEROBACTER AEROGENES  Final      Susceptibility   Enterobacter aerogenes - MIC*    CEFAZOLIN >=64 RESISTANT Resistant     CEFEPIME <=1 SENSITIVE Sensitive     CEFTAZIDIME <=1 SENSITIVE Sensitive     CEFTRIAXONE <=1 SENSITIVE Sensitive     CIPROFLOXACIN 1 INTERMEDIATE Intermediate     GENTAMICIN <=1 SENSITIVE Sensitive     IMIPENEM 2 SENSITIVE Sensitive     TRIMETH/SULFA <=20 SENSITIVE Sensitive     PIP/TAZO 8 SENSITIVE Sensitive     * ENTEROBACTER AEROGENES  Culture, blood (routine x 2)      Status: None   Collection Time: 01/29/15  2:30 PM  Result Value Ref Range Status   Specimen Description BLOOD LEFT ARM  Final   Special Requests BOTTLES DRAWN AEROBIC AND ANAEROBIC 5CC  Final   Culture  Setup Time   Final    GRAM NEGATIVE RODS GRAM POSITIVE COCCI IN BOTH AEROBIC AND ANAEROBIC BOTTLES CRITICAL RESULT CALLED TO, READ BACK BY AND VERIFIED WITH: Limmie Patricia  [id number] '@08'$ /23/16 MKELLY CRITICAL RESULT CALLED TO, READ BACK BY AND VERIFIED WITH: Thomasenia Bottoms, RN AT [phone] ON [id number] BY Rhea Bleacher NOTIFIED 2 ORGANISMS WERE PRESENT IN [facility] BOTTLE    Culture   Final    ENTEROBACTER AEROGENES SUSCEPTIBILITIES PERFORMED ON PREVIOUS CULTURE WITHIN THE LAST 5 DAYS. STAPHYLOCOCCUS SPECIES (COAGULASE NEGATIVE) THE SIGNIFICANCE OF ISOLATING THIS ORGANISM FROM A SINGLE SET OF BLOOD CULTURES WHEN MULTIPLE SETS ARE DRAWN IS UNCERTAIN. PLEASE NOTIFY THE MICROBIOLOGY DEPARTMENT WITHIN ONE WEEK IF SPECIATION AND SENSITIVITIES ARE REQUIRED.    Report Status 02/02/2015 FINAL  Final  Wound culture     Status: None   Collection Time: 01/30/15  7:51 AM  Result Value Ref Range Status   Specimen Description WOUND STERNUM  Final   Special Requests Normal  Final   Gram Stain   Final    NO WBC SEEN RARE SQUAMOUS EPITHELIAL CELLS PRESENT NO ORGANISMS SEEN Performed at [facility]    Culture   Final    FEW ENTEROBACTER AEROGENES Performed at [facility]    Report Status 02/02/2015 FINAL  Final   Organism ID, Bacteria ENTEROBACTER AEROGENES  Final      Susceptibility   Enterobacter aerogenes - MIC*    CEFAZOLIN >=64 RESISTANT Resistant     CEFEPIME <=1 SENSITIVE Sensitive     CEFTAZIDIME <=1 SENSITIVE Sensitive     CEFTRIAXONE <=1 SENSITIVE Sensitive     CIPROFLOXACIN <=0.25 SENSITIVE Sensitive     GENTAMICIN <=1 SENSITIVE Sensitive     IMIPENEM 2 INTERMEDIATE Intermediate     PIP/TAZO <=4 SENSITIVE Sensitive     TOBRAMYCIN <=1 SENSITIVE Sensitive     TRIMETH/SULFA <=20 SENSITIVE  Sensitive     * FEW ENTEROBACTER AEROGENES  Gram stain     Status: None   Collection  Time: 01/30/15  7:54 AM  Result Value Ref Range Status   Specimen Description WOUND STERNUM  Final   Special Requests Normal  Final   Gram Stain   Final    FEW WBC PRESENT, PREDOMINANTLY PMN FEW GRAM POSITIVE COCCI IN PAIRS RARE GRAM VARIABLE ROD    Report Status 01/30/2015 FINAL  Final  Culture, respiratory (NON-Expectorated)     Status: None   Collection Time: 01/30/15 11:51 AM  Result Value Ref Range Status   Specimen Description TRACHEAL ASPIRATE  Final   Special Requests NONE  Final   Gram Stain   Final    RARE WBC PRESENT, PREDOMINANTLY PMN NO SQUAMOUS EPITHELIAL CELLS SEEN RARE GRAM NEGATIVE RODS Performed at [facility]    Culture   Final    Non-Pathogenic Oropharyngeal-type Flora Isolated. Performed at [facility]    Report Status 02/01/2015 FINAL  Final  Wound culture     Status: None   Collection Time: 01/31/15  8:35 AM  Result Value Ref Range Status   Specimen Description WOUND  Final   Special Requests STERNUM  Final   Gram Stain   Final    ABUNDANT WBC PRESENT,BOTH PMN AND MONONUCLEAR NO SQUAMOUS EPITHELIAL CELLS SEEN NO ORGANISMS SEEN Performed at [facility]    Culture   Final    FEW ENTEROBACTER AEROGENES Performed at [facility]    Report Status 02/03/2015 FINAL  Final   Organism ID, Bacteria ENTEROBACTER AEROGENES  Final      Susceptibility   Enterobacter aerogenes - MIC*    CEFAZOLIN >=64 RESISTANT Resistant     CEFEPIME <=1 SENSITIVE Sensitive     CEFTAZIDIME <=1 SENSITIVE Sensitive     CEFTRIAXONE <=1 SENSITIVE Sensitive     CIPROFLOXACIN INTERMEDIATE      GENTAMICIN <=1 SENSITIVE Sensitive     IMIPENEM 1 SENSITIVE Sensitive     PIP/TAZO 8 SENSITIVE Sensitive     TOBRAMYCIN <=1 SENSITIVE Sensitive     TRIMETH/SULFA <=20 SENSITIVE Sensitive     * FEW ENTEROBACTER AEROGENES  Anaerobic culture     Status: None    Collection Time: 01/31/15  8:35 AM  Result Value Ref Range Status   Specimen Description WOUND  Final   Special Requests STERNUM  Final   Gram Stain   Final    ABUNDANT WBC PRESENT, PREDOMINANTLY PMN NO SQUAMOUS EPITHELIAL CELLS SEEN NO ORGANISMS SEEN Performed at [facility]    Culture   Final    NO ANAEROBES ISOLATED Performed at [facility]    Report Status 02/05/2015 FINAL  Final     ASSESSMENT: Overall he is better on therapy for Enterobacter sternal wound infection and bacteremia. We will need to review the condition of his wound with Dr. Servando Snare returns in order to determine optimal duration of antibiotic therapy.  PLAN: 1. Continue ceftriaxone  Michel Bickers, MD [facility] for [facility] [phone] pager   [phone] cell 02/07/2015, 2:16 PM

## 2015-02-07 NOTE — Progress Notes (Addendum)
[facility] DAILY ICU PROGRESS NOTE                   [address]            [place] [address]          [phone]   14 Days Post-Op Procedure(s) (LRB): CORONARY ARTERY BYPASS GRAFTING x 4 (LIMA-LAD, SVG-Int 1- Int 2, SVG-PD) ENDOSCOPIC GREATER SAPHENOUS VEIN HARVEST LEFT LEG (N/A) TRANSESOPHAGEAL ECHOCARDIOGRAM (TEE) (N/A)  Total Length of Stay:  LOS: 14 days   Subjective: In good spirits this am.  Rested well and has already walked this am. No new complaints.   Objective: Vital signs in last 24 hours: Temp:  [97.5 F (36.4 C)-98.8 F (37.1 C)] 97.7 F (36.5 C) (08/31 0721) Pulse Rate:  [47-98] 98 (08/31 0600) Cardiac Rhythm:  [-] Normal sinus rhythm (08/31 0400) Resp:  [13-23] 22 (08/31 0600) BP: (92-163)/(46-95) 163/83 mmHg (08/31 0600) SpO2:  [87 %-99 %] 93 % (08/31 0600) FiO2 (%):  [24 %] 24 % (08/30 2329) Weight:  [178 lb 9.2 oz (81 kg)] 178 lb 9.2 oz (81 kg) (08/31 0500)  Filed Weights   02/05/15 0500 02/06/15 0500 02/07/15 0500  Weight: 186 lb (84.369 kg) 183 lb 13.8 oz (83.4 kg) 178 lb 9.2 oz (81 kg)  PRE-OPERATIVE WEIGHT: 95 kg  Weight change: -5 lb 4.7 oz (-2.4 kg)   Hemodynamic parameters for last 24 hours:    Intake/Output from previous day: 08/30 0701 - 08/31 0700 In: 340 [P.O.:240; IV Piggyback:100] Out: 9379 [Urine:1575]  Intake/Output this shift:    Current Meds: Scheduled Meds: . amiodarone  400 mg Oral Q12H   Followed by  . [START ON 02/10/2015] amiodarone  200 mg Oral Daily  . bisacodyl  10 mg Oral Daily  . cefTRIAXone (ROCEPHIN)  IV  2 g Intravenous Q24H  . enoxaparin (LOVENOX) injection  30 mg Subcutaneous Q24H  . feeding supplement (PRO-STAT SUGAR FREE 64)  30 mL Oral TID  . furosemide  40 mg Intravenous Daily  . insulin aspart  0-15 Units Subcutaneous TID WC  . metoprolol tartrate  12.5 mg Oral BID  . moving right along book   Does not apply Once  . pantoprazole  40 mg Oral Daily  . sodium chloride  10-40 mL  Intracatheter Q12H  . sodium chloride  3 mL Intravenous Q12H  . thiamine  100 mg Oral Daily   Continuous Infusions:  PRN Meds:.sodium chloride, acetaminophen, diphenhydrAMINE, levalbuterol, LORazepam, metoprolol, ondansetron (ZOFRAN) IV, oxyCODONE, RESOURCE THICKENUP CLEAR, sodium chloride, sodium chloride, traMADol   Physical Exam: General appearance: alert, cooperative and no distress Heart: regular rate and rhythm Lungs: clear to auscultation bilaterally Extremities: No edema Wound: Sternal wound VAC in place, no surrounding erythema   Lab Results: CBC: Recent Labs  02/06/15 0430 02/07/15 0450  WBC 12.6* 13.4*  HGB 11.8* 12.6*  HCT 36.6* 39.5  PLT 399 433*   BMET:  Recent Labs  02/06/15 0430 02/07/15 0450  NA 146* 146*  K 3.4* 4.0  CL 107 108  CO2 30 29  GLUCOSE 130* 115*  BUN 33* 32*  CREATININE 1.42* 1.45*  CALCIUM 8.9 9.3    PT/INR: No results for input(s): LABPROT, INR in the last 72 hours. Radiology: No results found.   Assessment/Plan: S/P Procedure(s) (LRB): CORONARY ARTERY BYPASS GRAFTING x 4 (LIMA-LAD, SVG-Int 1- Int 2, SVG-PD) ENDOSCOPIC GREATER SAPHENOUS VEIN HARVEST LEFT LEG (N/A) TRANSESOPHAGEAL ECHOCARDIOGRAM (TEE) (N/A)  CV- AF, now maintaining  SR. BPs labile, generally trending up. Continue Amio, will hold off increasing Lopressor for now as he has had some bradycardia at times. Watch BPs, may need additional agent.  Enterobacter sternal wound infection- For wound VAC change this am,continue Rocephin per ID. WBC stable, afebrile.   Nutrition- Continue D3 diet with thickened liquids, supplements for now. For repeat swallow study today.  Renal- Cr stable.  Continue pulm toilet, ambulation.  Awaiting tx stepdown- apparently order was not placed until last pm.  COLLINS,GINA H 02/07/2015 7:45 AM  Looks great delirium resolved Min drainage from wound VAC Transfer to stepdown patient examined and medical record reviewed,agree with  above note. Tharon Aquas Trigt III 02/07/2015

## 2015-02-07 NOTE — Consult Note (Addendum)
WOC wound follow up Wound type: Vac dressing changed to midline sternal wound. Measurement: 11X2X4cm   Wound bed: 15% yellow slough, 85% red, some exposed bone visible to inner wound. Drainage (amount, consistency, odor) Mod amt thick red drainage in the cannister, no odor Periwound: Intact skin surrounding wound  Dressing procedure/placement/frequency: Applied one piece Mepitel contact layer and one piece black sponge to cont suction at 143m. Covered sutures with gauze and Vac drape as it was previously to 3 locations below the Vac dressing. Pt medicated foa pain prior to procedure and tolerated with mod amt discomfort. Plan dressing change Wed. DJulien GirtMSN, RN, [place]

## 2015-02-08 ENCOUNTER — Ambulatory Visit: Payer: PPO | Admitting: Cardiovascular Disease

## 2015-02-08 DIAGNOSIS — T814XXD Infection following a procedure, subsequent encounter: Secondary | ICD-10-CM

## 2015-02-08 DIAGNOSIS — Y838 Other surgical procedures as the cause of abnormal reaction of the patient, or of later complication, without mention of misadventure at the time of the procedure: Secondary | ICD-10-CM

## 2015-02-08 DIAGNOSIS — N179 Acute kidney failure, unspecified: Secondary | ICD-10-CM

## 2015-02-08 DIAGNOSIS — B952 Enterococcus as the cause of diseases classified elsewhere: Secondary | ICD-10-CM

## 2015-02-08 DIAGNOSIS — N189 Chronic kidney disease, unspecified: Secondary | ICD-10-CM

## 2015-02-08 LAB — GLUCOSE, CAPILLARY
Glucose-Capillary: 93 mg/dL (ref 65–99)
Glucose-Capillary: 145 mg/dL — ABNORMAL HIGH (ref 65–99)
Glucose-Capillary: 100 mg/dL — ABNORMAL HIGH (ref 65–99)
Glucose-Capillary: 111 mg/dL — ABNORMAL HIGH (ref 65–99)
Glucose-Capillary: 129 mg/dL — ABNORMAL HIGH (ref 65–99)
Glucose-Capillary: 97 mg/dL (ref 65–99)

## 2015-02-08 MED ORDER — ASPIRIN EC 325 MG PO TBEC
325.0000 mg | DELAYED_RELEASE_TABLET | Freq: Every day | ORAL | Status: DC
  Administered 2015-02-08 – 2015-02-09 (×2): 325 mg via ORAL
  Filled 2015-02-08 (×3): qty 1

## 2015-02-08 MED ORDER — ATORVASTATIN CALCIUM 40 MG PO TABS
40.0000 mg | ORAL_TABLET | Freq: Every day | ORAL | Status: DC
  Administered 2015-02-08: 40 mg via ORAL
  Filled 2015-02-08 (×2): qty 1

## 2015-02-08 MED ORDER — SODIUM CHLORIDE 0.9 % IJ SOLN
10.0000 mL | INTRAMUSCULAR | Status: DC | PRN
  Administered 2015-02-08: 40 mL
  Administered 2015-02-08: 10 mL
  Administered 2015-02-09: 30 mL
  Filled 2015-02-08 (×3): qty 40

## 2015-02-08 NOTE — Progress Notes (Signed)
[facility]  Patient Status:   New pt for [facility] this admission  [facility] is providing the following services: HHRN and Home Infusion Pharmacy for home IV ABX.  [name] hospital team will follow Randall Ali and be prepared for DC this week or over weekend.  Please note we will need script for home IV ABX upon DC home.  [facility] hospital Infusion coordinator will provide in hospital IV ABX teaching to support independence in the home.   If patient discharges after hours, please call [phone].   Larry Sierras 02/08/2015, 11:08 AM

## 2015-02-08 NOTE — Progress Notes (Addendum)
      [address]       [place] [address]             [phone]      15 Days Post-Op Procedure(s) (LRB): CORONARY ARTERY BYPASS GRAFTING x 4 (LIMA-LAD, SVG-Int 1- Int 2, SVG-PD) ENDOSCOPIC GREATER SAPHENOUS VEIN HARVEST LEFT LEG (N/A) TRANSESOPHAGEAL ECHOCARDIOGRAM (TEE) (N/A)   Subjective:  Randall Ali has no complaints.  He is ready to go home and is questioning when that will be happening.  + ambulation, + BM  Objective: Vital signs in last 24 hours: Temp:  [97.8 F (36.6 C)-98.6 F (37 C)] 98.6 F (37 C) (09/01 0502) Pulse Rate:  [75-87] 78 (09/01 0502) Cardiac Rhythm:  [-] Sinus bradycardia (08/31 2027) Resp:  [15-21] 16 (09/01 0502) BP: (104-156)/(59-77) 104/59 mmHg (09/01 0502) SpO2:  [91 %-97 %] 94 % (09/01 0502) Weight:  [185 lb 12.8 oz (84.278 kg)] 185 lb 12.8 oz (84.278 kg) (09/01 0502)  Intake/Output from previous day: 08/31 0701 - 09/01 0700 In: 50 [IV Piggyback:50] Out: 475 [Urine:475]  General appearance: alert, cooperative and no distress Heart: regular rate and rhythm Lungs: clear to auscultation bilaterally Abdomen: soft, non-tender; bowel sounds normal; no masses,  no organomegaly Extremities: edema no edema present Wound: clean and dry  Lab Results:  Recent Labs  02/06/15 0430 02/07/15 0450  WBC 12.6* 13.4*  HGB 11.8* 12.6*  HCT 36.6* 39.5  PLT 399 433*   BMET:  Recent Labs  02/06/15 0430 02/07/15 0450  NA 146* 146*  K 3.4* 4.0  CL 107 108  CO2 30 29  GLUCOSE 130* 115*  BUN 33* 32*  CREATININE 1.42* 1.45*  CALCIUM 8.9 9.3    PT/INR: No results for input(s): LABPROT, INR in the last 72 hours. ABG    Component Value Date/Time   PHART 7.513* 01/30/2015 0523   HCO3 30.2* 01/30/2015 0523   TCO2 31.4 01/30/2015 0523   ACIDBASEDEF 4.0* 01/24/2015 2031   O2SAT 99.1 01/30/2015 0523   CBG (last 3)   Recent Labs  02/07/15 1932 02/07/15 2138 02/08/15 0611  GLUCAP 145* 125* 100*    Assessment/Plan: S/P  Procedure(s) (LRB): CORONARY ARTERY BYPASS GRAFTING x 4 (LIMA-LAD, SVG-Int 1- Int 2, SVG-PD) ENDOSCOPIC GREATER SAPHENOUS VEIN HARVEST LEFT LEG (N/A) TRANSESOPHAGEAL ECHOCARDIOGRAM (TEE) (N/A)  1. CV- NSR good rate control- continue Amiodarone, Lopressor at current dose, BP remains labile 2. Pulm- no acute issues, continue IS 3. Renal- creatinine remains stable, weight is below baseline, on Lasix 4. ID- + Enterobacter sternal infection, on Rocephin- will defer to ID for duration of therapy, patient is ready to go home 5. Dispo- patient is stable, maintaining SR, on IV ABX for sternal infection..... Patient wants to go home, may be ready tomorrow, but need ID to give recommendations on course of ABX   LOS: 15 days    BARRETT, ERIN 02/08/2015  I have seen and examined the patient and agree with the assessment and plan as outlined.  Rexene Alberts 02/08/2015 8:28 PM

## 2015-02-08 NOTE — Progress Notes (Signed)
Epicardial pacing wires X 2 removed per protocol without difficulty. Tips intact. Pt tolerated well. VS and cardiac rhythm stable. Patient to be on bedrest with VS monitoring for 1 hour. Pt educated and verbalizes understanding.

## 2015-02-08 NOTE — Progress Notes (Signed)
INFECTIOUS DISEASE PROGRESS NOTE  ID: Randall Ali. is a 74 y.o. male with  Principal Problem:   Infection due to Enterobacter aerogenes Active Problems:   S/P CABG x 4   Acute respiratory failure with hypoxia   Delirium of mixed origin   Postoperative wound infection   Status post right foot surgery  Subjective: No complaints. Chest wound does not hurt.   Abtx:  Anti-infectives    Start     Dose/Rate Route Frequency Ordered Stop   02/02/15 1000  cefTRIAXone (ROCEPHIN) 2 g in dextrose 5 % 50 mL IVPB     2 g 100 mL/hr over 30 Minutes Intravenous Every 24 hours 02/02/15 0914     01/30/15 2200  vancomycin (VANCOCIN) IVPB 750 mg/150 ml premix  Status:  Discontinued     750 mg 150 mL/hr over 60 Minutes Intravenous Every 12 hours 01/30/15 0928 02/02/15 0914   01/30/15 0930  vancomycin (VANCOCIN) 2,000 mg in sodium chloride 0.9 % 500 mL IVPB     2,000 mg 250 mL/hr over 120 Minutes Intravenous  Once 01/30/15 0926 01/30/15 1326   01/30/15 0730  piperacillin-tazobactam (ZOSYN) IVPB 3.375 g  Status:  Discontinued     3.375 g 12.5 mL/hr over 240 Minutes Intravenous 3 times per day 01/30/15 0710 02/02/15 0914   01/24/15 2100  cefUROXime (ZINACEF) 1.5 g in dextrose 5 % 50 mL IVPB     1.5 g 100 mL/hr over 30 Minutes Intravenous Every 12 hours 01/24/15 1411 01/26/15 0853   01/24/15 2000  vancomycin (VANCOCIN) IVPB 1000 mg/200 mL premix     1,000 mg 200 mL/hr over 60 Minutes Intravenous  Once 01/24/15 1411 01/24/15 2054   01/24/15 0400  vancomycin (VANCOCIN) 1,500 mg in sodium chloride 0.9 % 250 mL IVPB     1,500 mg 125 mL/hr over 120 Minutes Intravenous To Surgery 01/23/15 1317 01/24/15 1000   01/24/15 0400  cefUROXime (ZINACEF) 1.5 g in dextrose 5 % 50 mL IVPB     1.5 g 100 mL/hr over 30 Minutes Intravenous To Surgery 01/23/15 1317 01/24/15 1245   01/24/15 0400  cefUROXime (ZINACEF) 750 mg in dextrose 5 % 50 mL IVPB  Status:  Discontinued     750 mg 100 mL/hr over 30 Minutes  Intravenous To Surgery 01/23/15 1318 01/24/15 1405      Medications:  Scheduled: . amiodarone  400 mg Oral Q12H   Followed by  . [START ON 02/10/2015] amiodarone  200 mg Oral Daily  . aspirin EC  325 mg Oral Daily  . atorvastatin  40 mg Oral q1800  . bisacodyl  10 mg Oral Daily  . cefTRIAXone (ROCEPHIN)  IV  2 g Intravenous Q24H  . enoxaparin (LOVENOX) injection  30 mg Subcutaneous Q24H  . feeding supplement (PRO-STAT SUGAR FREE 64)  30 mL Oral TID  . furosemide  40 mg Oral Daily  . insulin aspart  0-15 Units Subcutaneous TID WC  . metoprolol tartrate  12.5 mg Oral BID  . moving right along book   Does not apply Once  . oxymetazoline  1 spray Each Nare BID  . pantoprazole  40 mg Oral Daily  . sodium chloride  10-40 mL Intracatheter Q12H  . sodium chloride  3 mL Intravenous Q12H  . thiamine  100 mg Oral Daily    Objective: Vital signs in last 24 hours: Temp:  [97.8 F (36.6 C)-98.6 F (37 C)] 98.6 F (37 C) (09/01 0502) Pulse Rate:  [75-87] 78 (  09/01 0502) Resp:  [15-21] 16 (09/01 0502) BP: (104-156)/(59-77) 104/59 mmHg (09/01 0502) SpO2:  [91 %-97 %] 94 % (09/01 0502) Weight:  [84.278 kg (185 lb 12.8 oz)] 84.278 kg (185 lb 12.8 oz) (09/01 0502)   General appearance: alert, cooperative and no distress Resp: clear to auscultation bilaterally Chest wall: midline VAC, clean. no erythema Cardio: regular rate and rhythm GI: normal findings: bowel sounds normal and soft, non-tender Extremities: RUE PIC clean, no erythema.   Lab Results  Recent Labs  02/06/15 0430 02/07/15 0450  WBC 12.6* 13.4*  HGB 11.8* 12.6*  HCT 36.6* 39.5  NA 146* 146*  K 3.4* 4.0  CL 107 108  CO2 30 29  BUN 33* 32*  CREATININE 1.42* 1.45*   Liver Panel No results for input(s): PROT, ALBUMIN, AST, ALT, ALKPHOS, BILITOT, BILIDIR, IBILI in the last 72 hours. Sedimentation Rate No results for input(s): ESRSEDRATE in the last 72 hours. C-Reactive Protein No results for input(s): CRP in the  last 72 hours.  Microbiology: Recent Results (from the past 240 hour(s))  Culture, expectorated sputum-assessment     Status: None   Collection Time: 01/29/15  1:30 PM  Result Value Ref Range Status   Specimen Description SPUTUM  Final   Special Requests Normal  Final   Sputum evaluation   Final    MICROSCOPIC FINDINGS SUGGEST THAT THIS SPECIMEN IS NOT REPRESENTATIVE OF LOWER RESPIRATORY SECRETIONS. PLEASE RECOLLECT. Results Called to: Reece Levy AT 5093 01/29/15 BY L BENFIELD    Report Status 01/29/2015 FINAL  Final  Culture, Urine     Status: None   Collection Time: 01/29/15  1:30 PM  Result Value Ref Range Status   Specimen Description URINE, RANDOM  Final   Special Requests Normal  Final   Culture NO GROWTH 1 DAY  Final   Report Status 01/30/2015 FINAL  Final  Culture, blood (routine x 2)     Status: None   Collection Time: 01/29/15  2:21 PM  Result Value Ref Range Status   Specimen Description BLOOD LEFT ANTECUBITAL  Final   Special Requests BOTTLES DRAWN AEROBIC AND ANAEROBIC 10CC  Final   Culture  Setup Time   Final    GRAM NEGATIVE RODS ANAEROBIC BOTTLE ONLY CRITICAL RESULT CALLED TO, READ BACK BY AND VERIFIED WITH: Reece Levy AT [id number] ON [id number] BY Rhea Bleacher    Culture ENTEROBACTER AEROGENES  Final   Report Status 02/01/2015 FINAL  Final   Organism ID, Bacteria ENTEROBACTER AEROGENES  Final      Susceptibility   Enterobacter aerogenes - MIC*    CEFAZOLIN >=64 RESISTANT Resistant     CEFEPIME <=1 SENSITIVE Sensitive     CEFTAZIDIME <=1 SENSITIVE Sensitive     CEFTRIAXONE <=1 SENSITIVE Sensitive     CIPROFLOXACIN 1 INTERMEDIATE Intermediate     GENTAMICIN <=1 SENSITIVE Sensitive     IMIPENEM 2 SENSITIVE Sensitive     TRIMETH/SULFA <=20 SENSITIVE Sensitive     PIP/TAZO 8 SENSITIVE Sensitive     * ENTEROBACTER AEROGENES  Culture, blood (routine x 2)     Status: None   Collection Time: 01/29/15  2:30 PM  Result Value Ref Range Status   Specimen Description BLOOD  LEFT ARM  Final   Special Requests BOTTLES DRAWN AEROBIC AND ANAEROBIC 5CC  Final   Culture  Setup Time   Final    GRAM NEGATIVE RODS GRAM POSITIVE COCCI IN BOTH AEROBIC AND ANAEROBIC BOTTLES CRITICAL RESULT CALLED TO, READ BACK BY AND  VERIFIED WITH: Limmie Patricia  3709 $Rem'@08'bKux$ /23/16 MKELLY CRITICAL RESULT CALLED TO, READ BACK BY AND VERIFIED WITH: Thomasenia Bottoms, RN AT [phone] ON [id number] BY Rhea Bleacher NOTIFIED 2 ORGANISMS WERE PRESENT IN [facility] BOTTLE    Culture   Final    ENTEROBACTER AEROGENES SUSCEPTIBILITIES PERFORMED ON PREVIOUS CULTURE WITHIN THE LAST 5 DAYS. STAPHYLOCOCCUS SPECIES (COAGULASE NEGATIVE) THE SIGNIFICANCE OF ISOLATING THIS ORGANISM FROM A SINGLE SET OF BLOOD CULTURES WHEN MULTIPLE SETS ARE DRAWN IS UNCERTAIN. PLEASE NOTIFY THE MICROBIOLOGY DEPARTMENT WITHIN ONE WEEK IF SPECIATION AND SENSITIVITIES ARE REQUIRED.    Report Status 02/02/2015 FINAL  Final  Wound culture     Status: None   Collection Time: 01/30/15  7:51 AM  Result Value Ref Range Status   Specimen Description WOUND STERNUM  Final   Special Requests Normal  Final   Gram Stain   Final    NO WBC SEEN RARE SQUAMOUS EPITHELIAL CELLS PRESENT NO ORGANISMS SEEN Performed at [facility]    Culture   Final    FEW ENTEROBACTER AEROGENES Performed at [facility]    Report Status 02/02/2015 FINAL  Final   Organism ID, Bacteria ENTEROBACTER AEROGENES  Final      Susceptibility   Enterobacter aerogenes - MIC*    CEFAZOLIN >=64 RESISTANT Resistant     CEFEPIME <=1 SENSITIVE Sensitive     CEFTAZIDIME <=1 SENSITIVE Sensitive     CEFTRIAXONE <=1 SENSITIVE Sensitive     CIPROFLOXACIN <=0.25 SENSITIVE Sensitive     GENTAMICIN <=1 SENSITIVE Sensitive     IMIPENEM 2 INTERMEDIATE Intermediate     PIP/TAZO <=4 SENSITIVE Sensitive     TOBRAMYCIN <=1 SENSITIVE Sensitive     TRIMETH/SULFA <=20 SENSITIVE Sensitive     * FEW ENTEROBACTER AEROGENES  Gram stain     Status: None   Collection Time: 01/30/15  7:54 AM    Result Value Ref Range Status   Specimen Description WOUND STERNUM  Final   Special Requests Normal  Final   Gram Stain   Final    FEW WBC PRESENT, PREDOMINANTLY PMN FEW GRAM POSITIVE COCCI IN PAIRS RARE GRAM VARIABLE ROD    Report Status 01/30/2015 FINAL  Final  Culture, respiratory (NON-Expectorated)     Status: None   Collection Time: 01/30/15 11:51 AM  Result Value Ref Range Status   Specimen Description TRACHEAL ASPIRATE  Final   Special Requests NONE  Final   Gram Stain   Final    RARE WBC PRESENT, PREDOMINANTLY PMN NO SQUAMOUS EPITHELIAL CELLS SEEN RARE GRAM NEGATIVE RODS Performed at [facility]    Culture   Final    Non-Pathogenic Oropharyngeal-type Flora Isolated. Performed at [facility]    Report Status 02/01/2015 FINAL  Final  Wound culture     Status: None   Collection Time: 01/31/15  8:35 AM  Result Value Ref Range Status   Specimen Description WOUND  Final   Special Requests STERNUM  Final   Gram Stain   Final    ABUNDANT WBC PRESENT,BOTH PMN AND MONONUCLEAR NO SQUAMOUS EPITHELIAL CELLS SEEN NO ORGANISMS SEEN Performed at [facility]    Culture   Final    FEW ENTEROBACTER AEROGENES Performed at [facility]    Report Status 02/03/2015 FINAL  Final   Organism ID, Bacteria ENTEROBACTER AEROGENES  Final      Susceptibility   Enterobacter aerogenes - MIC*    CEFAZOLIN >=64 RESISTANT Resistant     CEFEPIME <=1 SENSITIVE  Sensitive     CEFTAZIDIME <=1 SENSITIVE Sensitive     CEFTRIAXONE <=1 SENSITIVE Sensitive     CIPROFLOXACIN INTERMEDIATE      GENTAMICIN <=1 SENSITIVE Sensitive     IMIPENEM 1 SENSITIVE Sensitive     PIP/TAZO 8 SENSITIVE Sensitive     TOBRAMYCIN <=1 SENSITIVE Sensitive     TRIMETH/SULFA <=20 SENSITIVE Sensitive     * FEW ENTEROBACTER AEROGENES  Anaerobic culture     Status: None   Collection Time: 01/31/15  8:35 AM  Result Value Ref Range Status   Specimen Description WOUND  Final   Special  Requests STERNUM  Final   Gram Stain   Final    ABUNDANT WBC PRESENT, PREDOMINANTLY PMN NO SQUAMOUS EPITHELIAL CELLS SEEN NO ORGANISMS SEEN Performed at [facility]    Culture   Final    NO ANAEROBES ISOLATED Performed at [facility]    Report Status 02/05/2015 FINAL  Final    Studies/Results: No results found.   Assessment/Plan: Sternal Wound Infection  Given that he has exposed bone would favor 6 weeks of IV ceftraixone (today is day 8)   Wound care  F/u in CVTS office  Enterobacter bacteremia  Will need repeat BCx at end of his course  ARF/CRI  His Cr is increasing. Has been variable in hospital, consider f/u, renal eval.   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

## 2015-02-08 NOTE — Care Management Note (Addendum)
Case Management Note Note started by Luz Lex RNCM  Patient Details  Name: Randall Ali. MRN: [id number] Date of Birth: [birth year]  Subjective/Objective:     Patient lives at home alone.  Daughter and one of his three sisters live within a block away. This sister also has a daughter that will be assisting.  Plan for discharge is to go to his house, one of his sisters or niece will be with him during the day and his daughter will be with him at night.  Informed patient he may have the wound vac on when he goes home, feels his family can assist with this.  Continues on oxygen at this time.  Will continue to follow for progression and needs on discharge.  VAC form on chart will need completed if plan is for discharge with VAC.  Will also follow to see if will need IV antibiotics on discharge.                  Action/Plan:   Expected Discharge Date:                  Expected Discharge Plan:  [place]  In-House Referral:     Discharge planning Services  CM Consult  Post Acute Care Choice:  Home Health Choice offered to:  Patient, Adult Children  DME Arranged:    DME Agency:     HH Arranged:  RN, IV Antibiotics HH Agency:  [place]  Status of Service:  In process, will continue to follow  Medicare Important Message Given:  Yes-third notification given Date Medicare IM Given:    Medicare IM give by:    Date Additional Medicare IM Given:    Additional Medicare Important Message give by:     If discussed at [place] of Stay Meetings, dates discussed:    Additional Comments:   02/08/15- Marvetta Gibbons RN, BSN [phone]-- order in for [facility] for wound VAC needs- ID to see pt for IV abx recommendations pt already has PICC, spoke with pt at bedside regarding [facility] agency of choice- call was made to pt's daughter [name] who states that they want to use [facility]- referral has been called to Santiago Glad with [facility] regarding [place] needs for wound VAC and IV abx- have  also spoken with Pam at [facility] regarding IV abx needs- forms for Wound VAC remain on shadow chart- spoke with [place] [facility]- who will come sign forms- once completed will f/u with [employer] to start process for home wound VAC approval.  Update- 1240- wound VAC form signed- spoke to [place] with [employer]- will fax order form and needed info to [place] to see if insurance will approve [employer] wound VAC- will f/u with Rickie in am.   Plan for ?? IV antibiotics at home - ID to talk with surgeon to decide, already has picc line.  Continues with VAC and will need at home per nurse.  [place] agency list for [employer] given to patient, states would like daughters and sisters input on agency.   Dawayne Patricia, RN 02/08/2015, 11:01 AM

## 2015-02-08 NOTE — Progress Notes (Signed)
SLP Cancellation Note  Patient Details Name: Randall Ali. MRN: [id number] DOB: 11/23/1940   Cancelled treatment:       Reason Eval/Treat Not Completed: Other (comment) Pt refused therapy this am, was not able to return to reattempt. Will continue efforts.    Vasilia Dise, Katherene Ponto 02/08/2015, 3:11 PM

## 2015-02-08 NOTE — Discharge Summary (Signed)
Physician Discharge Summary  Patient ID: Randall Ali. MRN: [id number] DOB/AGE: 73-Dec-1942 73 y.o.  Admit date: 01/24/2015 Discharge date: 02/09/2015  Admission Diagnoses:  Patient Active Problem List   Diagnosis Date Noted  . History of coronary artery stent placement   . OSA (obstructive sleep apnea) 01/13/2015  . Coronary artery disease involving native coronary artery of native heart with angina pectoris with documented spasm   . Hyperlipidemia   . Essential hypertension   . Chest pain 01/05/2015  . H/O surgical procedure 01/01/2015  . Hepatic steatosis 11/24/2014  . Unexplained night sweats 11/02/2014  . Angina pectoris 08/11/2014  . PAD (peripheral artery disease) 04/11/2014  . Chronic daily headache 04/11/2014  . Chronic fatigue and malaise 01/11/2014  . Inflammation of toe 05/19/2013  . H/O arthroplasty 05/19/2013  . Screening for prostate cancer 02/27/2012  . Medicare annual wellness visit, subsequent 02/27/2012  . Erectile dysfunction 02/27/2012  . HLD (hyperlipidemia)   . HTN (hypertension)   . Dysphagia 12/24/2011  . Pulmonary nodule 12/24/2011  . HYPERLIPIDEMIA-MIXED 03/12/2010  . OLD MYOCARDIAL INFARCTION 03/08/2009  . CAD, NATIVE VESSEL 03/08/2009   Discharge Diagnoses:   Patient Active Problem List   Diagnosis Date Noted  . Infection due to Enterobacter aerogenes 02/02/2015  . Postoperative wound infection 02/02/2015  . Status post right foot surgery 02/02/2015  . Acute respiratory failure with hypoxia 01/29/2015  . Delirium of mixed origin 01/29/2015  . 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  . History of coronary artery stent placement   . OSA (obstructive sleep apnea) 01/13/2015  . Coronary artery disease involving native coronary artery of native heart with angina pectoris with documented spasm   . Hyperlipidemia   . Essential hypertension   . Chest pain 01/05/2015  . H/O surgical procedure 01/01/2015  . Hepatic steatosis 11/24/2014  . Unexplained  night sweats 11/02/2014  . Angina pectoris 08/11/2014  . PAD (peripheral artery disease) 04/11/2014  . Chronic daily headache 04/11/2014  . Chronic fatigue and malaise 01/11/2014  . Inflammation of toe 05/19/2013  . H/O arthroplasty 05/19/2013  . Screening for prostate cancer 02/27/2012  . Medicare annual wellness visit, subsequent 02/27/2012  . Erectile dysfunction 02/27/2012  . HLD (hyperlipidemia)   . HTN (hypertension)   . Dysphagia 12/24/2011  . Pulmonary nodule 12/24/2011  . HYPERLIPIDEMIA-MIXED 03/12/2010  . OLD MYOCARDIAL INFARCTION 03/08/2009  . CAD, NATIVE VESSEL 03/08/2009   Discharged Condition: good  History of Present Illness:  Randall Ali is a 74 yo male with known history of CAD, HTN, and HLD.  He is S/P MI with PCI with stent to RCA in 2001.  He presented to OSH with complaints of intermittent chest pain that occurred while patient was at work.  This was associated with palpitations and diaphoresis.  There was no associated nausea, vomiting, or syncope.  Upon arrival to the ED the patient's Troponin was negative.  EKG was unremarkable.  He was admitted and underwent cardiac catheterization which showed multivessel CAD with a preserved EF.  It was felt coronary bypass graft would be indicated and he was subsequently referred to [facility].  He was evaluated by Dr. Servando Snare on 01/23/2015 at which time he was in agreement the patient should undergo bypass surgery.  The risks and benefits of the procedure were explained to the patient and he was agreeable to proceed.  Hospital Course:   [name] presented to [facility] on 01/24/2015.  He was taken to the operating room and underwent CABG x 4  utilizing LIMA to LAD, SVG to Intermediate 1, SVG to Intermediate 2, and SVG to PD.  He also underwent endoscopic harvest of greater saphenous vein from left leg.  He tolerated the procedure well and was taken to the SICU in stable condition.  He was extubated the evening of surgery.  POD  #2 the patient's chest tubes and arterial lines were removed without difficulty.  He developed hypertension and his lopressor was titrated as tolerated and he was started on an ACE.  He developed Atrial Fibrillation with RVR.  He was subsequently treated with IV Amiodarone.  He required several additional bolus administrations for rate control, but he ultimately converted to NSR.  The patient developed severe delirium requiring restraints and IV haldol.  This initially provided some relief however, the patient continued to grow more delirius and agitated requiring additional IV sedation.  He required continuous BIPAP to maintain sats.  He was hypervolemic and required IV Lasix.  He was started on TPN.  He continued to have difficulty weaning off BIPAP.  He became febrile with Temp of 102.  Critical car consult was obtained and felt that patient would likely benefit from re-intubation to stabilize the patient due to continued distress and agitation.  Blood cultures were obtained and ultimately came back positive.  Final culture results were for Enterobacter Aerogenea.  He was placed on IV ABX and ID consult was obtained.  He developed evidence of sternal wound infection.  Being patient was sedated on vent, bedside debridement was performed, cultures were obtained and a wound vac was placed on POD #7.  Wound care was consulted to continue [facility] care.  The patients respiratory status improved and he was able to extubated on POD #8.  He underwent swallow evaluation which initially did not show the patient was safe to start on oral diet.  He underwent Modified Barium Swallow and results indicated patient would require thickened liquids and mechanical soft diet.  He again developed Atrial Fibrillation.  He required additional bolus of IV Amiodarone and his oral regimen was increased.  He again converted to NSR.  The patient continued to progress.  He underwent repeat  Swallow evaluation and it was felt he could tolerate a  regular diet.    He was ambulating around the SICU and felt medically safe for transfer to the telemetry unit on POD #14.  The patient continues to progress.  He remains on IV ABX for his bacteremia.  ID recommends 6 weeks of IV Rocephin for therapy.  PICC line is in place and functioning.  He will need weekly CBC, CMP, ESR, CRP, and Vanc level.  These results needs faxed to ID clinic at [phone].  Wound vac care will continue on a MWF schedule.  He continues to ambulate independently around the unit.  He is tolerating a diet.  His pacing wires have been removed.                      Consults: pulmonary/intensive care and ID  Significant Diagnostic Studies:    Ost LAD lesion, 80% stenosed.  Dist RCA lesion, 70% stenosed.  Mid RCA lesion, 30% stenosed. The lesion was previously treated with a stent (unknown type) .  1st Mrg lesion, 70% stenosed.  2nd Mrg-2 lesion, 70% stenosed.  2nd Mrg-1 lesion, 70% stenosed.  The left ventricular systolic function is normal.  Treatments: IV ABX for 6 weeks, end date is 03/12/2015  Surgery:  Bedside debridement of sternal wound with placement  of wound vac  Coronary artery bypass grafting x4, with left internal mammary to the left anterior descending coronary artery, sequential reverse saphenous vein graft to the first and second intermediate coronary artery, reverse saphenous vein graft to the posterior descending coronary artery, with the left leg greater saphenous thigh and calf endo vein harvesting.  Disposition: 01-Home or Self Care   Discharge Medications/Instructions:  The patient has been discharged on:   1.Beta Blocker:  Yes [  x ]                              No   [   ]                              If No, reason:  2.Ace Inhibitor/ARB: Yes [   ]                                     No  [  x  ]                                     If No, reason: Labile Blood Pressure  3.Statin:   Yes [  x ]                  No  [   ]                   If No, reason:  4.Ecasa:  Yes  [ x  ]                  No   [   ]                  If No, reason:     Medication List    STOP taking these medications        ibuprofen 200 MG tablet  Commonly known as:  ADVIL,MOTRIN     metoprolol succinate 25 MG 24 hr tablet  Commonly known as:  TOPROL-XL     nitroGLYCERIN 0.4 MG SL tablet  Commonly known as:  NITROSTAT      TAKE these medications        amiodarone 200 MG tablet  Commonly known as:  PACERONE  Take 1 tablet (200 mg total) by mouth 2 (two) times daily. X 7 days, then decrease to 200 mg BID x 7 days, 200 mg daily  Start taking on:  02/10/2015     aspirin 81 MG EC tablet  Take 81 mg by mouth daily.     atorvastatin 40 MG tablet  Commonly known as:  LIPITOR  Take 1 tablet (40 mg total) by mouth daily at 6 PM.     cefTRIAXone 2 g in dextrose 5 % 50 mL  Inject 2 g into the vein daily.     furosemide 20 MG tablet  Commonly known as:  LASIX  Take 1 tablet (20 mg total) by mouth daily. For 5 days     metoprolol tartrate 25 MG tablet  Commonly known as:  LOPRESSOR  Take 0.5 tablets (12.5 mg total) by mouth 2 (two) times daily.     oxyCODONE 5 MG immediate release tablet  Commonly known as:  Oxy IR/ROXICODONE  Take 1 tablet (5 mg total) by mouth every 4 (four) hours as needed for severe pain.     PROBIOTIC PO  Take 2 tablets by mouth daily. accuflora     RA CALCIUM PLUS VITAMIN D 600-125 MG-UNIT Tabs  Generic drug:  Calcium-Vitamin D  Take 1 tablet by mouth 2 (two) times daily.     RESOURCE THICKENUP CLEAR Powd  Use as needed     simvastatin 20 MG tablet  Commonly known as:  ZOCOR  Take 1 tablet (20 mg total) by mouth at bedtime.     tadalafil 20 MG tablet  Commonly known as:  CIALIS  Take 1 tablet (20 mg total) by mouth daily as needed for erectile dysfunction.     traMADol 50 MG tablet  Commonly known as:  ULTRAM  Take 1 tablet (50 mg total) by mouth every 6 (six) hours as needed.     vitamin B-12  1000 MCG tablet  Commonly known as:  CYANOCOBALAMIN  Take 1,000 mcg by mouth daily.     vitamin C 500 MG tablet  Commonly known as:  ASCORBIC ACID  Take 500 mg by mouth daily.     Zinc 100 MG Tabs  Take 1 tablet by mouth daily.     zolpidem 10 MG tablet  Commonly known as:  AMBIEN  TAKE ONE TABLET AT BEDTIME AS NEEDED FORSLEEP        1. Please draw weekly CBC, CMP, CRP, ESR and check Vanc Levels- fax results to ID clinic at [phone] 2. Wound vac changes every MWF     Follow-up Information    Follow up with [name], MD On 03/07/2015.   Specialty:  Cardiothoracic Surgery   Why:  Appointment is at 9:00   Contact information:   [address] [place] [address] [phone]       Follow up with  [facility] On 03/07/2015.   Why:  please get CXR at 8:30   Contact information:   [place]       Follow up with [facility].   Why:  HH-RN arranged- for wound VAC drgs changes and IV abx needs   Contact information:   [address] [place] [address] [phone]       Signed: Ellwood Handler 02/09/2015, 8:06 AM

## 2015-02-08 NOTE — Progress Notes (Signed)
CARDIAC REHAB PHASE I   PRE:  Rate/Rhythm: 68 SR    BP: sitting 135/68    SaO2: 90-92 RA  MODE:  Ambulation: 690 ft   POST:  Rate/Rhythm: 84 SR    BP: sitting 138/78     SaO2: 96 RA  Pt stood without problems and ambulated with RW, no assist needed. Feels good except chronic toe. Might not need RW if wound vac will be smaller for home. Encouraged x2 more walks today and IS. [phone]   Josephina Shih [place] CES, ACSM 02/08/2015 2:58 PM

## 2015-02-08 NOTE — Progress Notes (Signed)
   02/08/15 1803  Mobility  Activity Ambulate in hall  Level of Assistance Independent  Assistive Device Front wheel walker  Distance Ambulated (ft) 300 ft  Ambulation Response Tolerated well

## 2015-02-09 ENCOUNTER — Telehealth: Payer: Self-pay | Admitting: Cardiovascular Disease

## 2015-02-09 LAB — GLUCOSE, CAPILLARY
Glucose-Capillary: 103 mg/dL — ABNORMAL HIGH (ref 65–99)
Glucose-Capillary: 108 mg/dL — ABNORMAL HIGH (ref 65–99)

## 2015-02-09 MED ORDER — OXYCODONE HCL 5 MG PO TABS: 5.0000 mg | ORAL_TABLET | ORAL | Status: DC | PRN

## 2015-02-09 MED ORDER — AMIODARONE HCL 200 MG PO TABS: 200.0000 mg | ORAL_TABLET | Freq: Two times a day (BID) | ORAL | Status: DC

## 2015-02-09 MED ORDER — DEXTROSE 5 % IV SOLN: 2.0000 g | INTRAVENOUS | Status: DC

## 2015-02-09 MED ORDER — RESOURCE THICKENUP CLEAR PO POWD
ORAL | Status: DC
Start: 2015-02-09 — End: 2015-02-14

## 2015-02-09 MED ORDER — FUROSEMIDE 20 MG PO TABS: 20.0000 mg | ORAL_TABLET | Freq: Every day | ORAL | Status: DC

## 2015-02-09 MED ORDER — SODIUM CHLORIDE 0.9 % IJ SOLN
10.0000 mL | INTRAMUSCULAR | Status: DC | PRN
Start: 2015-02-09 — End: 2015-02-09
  Administered 2015-02-09: 10 mL
  Filled 2015-02-09: qty 40

## 2015-02-09 MED ORDER — HEPARIN SOD (PORK) LOCK FLUSH 100 UNIT/ML IV SOLN
250.0000 [IU] | INTRAVENOUS | Status: AC | PRN
  Administered 2015-02-09: 250 [IU]

## 2015-02-09 MED ORDER — ATORVASTATIN CALCIUM 40 MG PO TABS: 40.0000 mg | ORAL_TABLET | Freq: Every day | ORAL | Status: DC

## 2015-02-09 MED ORDER — METOPROLOL TARTRATE 25 MG PO TABS: 12.5000 mg | ORAL_TABLET | Freq: Two times a day (BID) | ORAL | Status: DC

## 2015-02-09 NOTE — Progress Notes (Signed)
Peripherally Inserted Central Catheter/Midline Placement  The IV Nurse has discussed with the patient and/or persons authorized to consent for the patient, the purpose of this procedure and the potential benefits and risks involved with this procedure.  The benefits include less needle sticks, lab draws from the catheter and patient may be discharged home with the catheter.  Risks include, but not limited to, infection, bleeding, blood clot (thrombus formation), and puncture of an artery; nerve damage and irregular heat beat.  Alternatives to this procedure were also discussed.  PICC/Midline Placement Documentation  PICC / Midline Single Lumen 02/09/15 PICC Right Other (Comment) 38 cm 0 cm (Active)  Indication for Insertion or Continuance of Line Home intravenous therapies (PICC only) 02/09/2015  3:00 PM  Exposed Catheter (cm) 0 cm 02/09/2015  3:00 PM  Dressing Change Due 02/16/15 02/09/2015  3:00 PM    Exchanged Triple lumen PICC for Single lumen PICC for home medications.   Jule Economy Horton 02/09/2015, 3:21 PM

## 2015-02-09 NOTE — Consult Note (Signed)
   [facility] CM Inpatient Consult   02/09/2015  Randall Ali. 1941-03-07 [id number] Referral received for post hospital follow up as the patient is to be discharged home with [facility] for post hospital care.  Came by to speak with the patient but he was in the mist of a sterile procedure for his PICC line.  Will continue to follow up as time permits.  Patient is for potential discharge home today per inpatient RNCM. Natividad Brood, RN BSN [facility] Liaison  [phone] business mobile phone

## 2015-02-09 NOTE — Consult Note (Signed)
WOC wound follow up Wound type: Vac dressing changed to midline sternal wound. Wound appearance unchanged from Wed. Drainage (amount, consistency, odor) Mod amt thick yellow drainage in the cannister, not as thick as previously. Periwound: Intact skin surrounding wound  Dressing procedure/placement/frequency: Applied one piece Mepitel contact layer and one piece black sponge to cont suction at 148m.Pt medicated for pain prior to procedure and tolerated with mod amt discomfort. Plan dressing change Mon.  Pt is awaiting delivery of home Vac machine for discharge. DJulien GirtMSN, RN, [place]

## 2015-02-09 NOTE — Telephone Encounter (Signed)
Patient is at [facility] s/p open heart surgery .  Patient is being dc soon and is prescribed Lipitor and prefers to stay on Zocor .  Wants to see what [name] thinks.  Please call.

## 2015-02-09 NOTE — Progress Notes (Signed)
CARDIAC REHAB PHASE I   Pt just finished ambulating with PT, hoping to go home today. [facility] discharge education completed. Reviewed IS, sternal precautions, activity progression, exercise, risk factors, heart healthy diet and sodium restrictions, limiting alchohol consumption, daily weights and phase 2 cardiac rehab. Pt verbalized understanding. Pt in recliner, call light within reach, states he has no other questions at this time. Pt agrees to phase 2 cardiac rehab referral, will send to [facility].   [phone]   Lenna Sciara, RN, BSN 02/09/2015 11:34 AM

## 2015-02-09 NOTE — Progress Notes (Signed)
      [address]       [place] [address]             [phone]      16 Days Post-Op Procedure(s) (LRB): CORONARY ARTERY BYPASS GRAFTING x 4 (LIMA-LAD, SVG-Int 1- Int 2, SVG-PD) ENDOSCOPIC GREATER SAPHENOUS VEIN HARVEST LEFT LEG (N/A) TRANSESOPHAGEAL ECHOCARDIOGRAM (TEE) (N/A)   Subjective:  Randall Ali has no complaints.  He is adamant he wants discharged today.  He states he can't stay here another day.  I explained that we are waiting for wound vac approval.    Objective: Vital signs in last 24 hours: Temp:  [97.5 F (36.4 C)-98.1 F (36.7 C)] 98.1 F (36.7 C) (09/02 0542) Pulse Rate:  [70-77] 75 (09/02 0542) Cardiac Rhythm:  [-] Normal sinus rhythm (09/01 1930) Resp:  [18-20] 18 (09/02 0542) BP: (110-144)/(56-78) 127/61 mmHg (09/02 0542) SpO2:  [94 %-98 %] 94 % (09/02 0542) Weight:  [184 lb 14.4 oz (83.87 kg)] 184 lb 14.4 oz (83.87 kg) (09/02 0542)  Intake/Output from previous day: 09/01 0701 - 09/02 0700 In: 480 [P.O.:480] Out: 625 [Urine:600; Drains:25]  General appearance: alert, cooperative and no distress Heart: regular rate and rhythm Lungs: clear to auscultation bilaterally Abdomen: soft, non-tender; bowel sounds normal; no masses,  no organomegaly Extremities: edema trace Wound: wound vac on sterum, EVH site clean and dry  Lab Results:  Recent Labs  02/07/15 0450  WBC 13.4*  HGB 12.6*  HCT 39.5  PLT 433*   BMET:  Recent Labs  02/07/15 0450  NA 146*  K 4.0  CL 108  CO2 29  GLUCOSE 115*  BUN 32*  CREATININE 1.45*  CALCIUM 9.3    PT/INR: No results for input(s): LABPROT, INR in the last 72 hours. ABG    Component Value Date/Time   PHART 7.513* 01/30/2015 0523   HCO3 30.2* 01/30/2015 0523   TCO2 31.4 01/30/2015 0523   ACIDBASEDEF 4.0* 01/24/2015 2031   O2SAT 99.1 01/30/2015 0523   CBG (last 3)   Recent Labs  02/08/15 1646 02/08/15 2111 02/09/15 0553  GLUCAP 129* 97 103*    Assessment/Plan: S/P  Procedure(s) (LRB): CORONARY ARTERY BYPASS GRAFTING x 4 (LIMA-LAD, SVG-Int 1- Int 2, SVG-PD) ENDOSCOPIC GREATER SAPHENOUS VEIN HARVEST LEFT LEG (N/A) TRANSESOPHAGEAL ECHOCARDIOGRAM (TEE) (N/A)  1. CV- NSR- continue Amiodarone, Lopressor 2. Pulm - no acute issues, continue IS 3. Renal- creatinine stable, will taper Lasix over next several days 4. ID- + Enterobacter in sternal infection- continue Rocephin for 6 weeks of therapy per ID 5. Sternal infection- wound vac care per WOC, home use is currently being arranged 6. Dispo- patient stable, will plan to d/c home today if wound vac is arranged   LOS: 16 days    Lynette Topete 02/09/2015

## 2015-02-09 NOTE — Progress Notes (Signed)
Discharged to home with family office visits in place teaching done  

## 2015-02-09 NOTE — Progress Notes (Addendum)
Physical Therapy Treatment Patient Details Name: Randall Ali. MRN: [id number] DOB: [birth year] Today's Date: 02/09/2015    History of Present Illness Pt is a 74 y/o male with a recent diagnosis or 3 vessel CAD who was admitted for CABG 8/17. The procedure was without complication but his hospital couse was complicated by post-op AF, severe delerium, and dyspnea requiring BiPAP and intubation x2. Pt was extubated 8/25. sternal wound infection with dehiscence and VAC dressing    PT Comments    Patient progressing well. Feels his walking is nearly back to baseline (longterm issues with Rt 1-2nd toes limiting his tolerance for longer distances). OK for discharge from PT perspective. He states family members can assist with carrying VAC if too heavy for him. Thinks his sister has a RW if he decides he needs one to help with transporting/carrying VAC (if too heavy for sternal precautions).   Follow Up Recommendations  No PT follow up;Supervision/Assistance - 24 hour (24 hr due to [facility] & possibly assist when walking)     Equipment Recommendations  None recommended by PT    Recommendations for Other Services       Precautions / Restrictions Precautions Precautions: Fall;Sternal Precaution Comments: Wound vac, Patient able to recall precautions and maintains precautions with functional mobility.  Restrictions Weight Bearing Restrictions: No (Sternal)    Mobility  Bed Mobility               General bed mobility comments: able to verblize correct techniques for in/out of bed  Transfers Overall transfer level: Modified independent Equipment used: None Transfers: Sit to/from Stand Sit to Stand: Min assist;Modified independent (Device/Increase time)         General transfer comment: no cues needed; very steady; adhered to precautions  Ambulation/Gait Ambulation/Gait assistance: Modified independent (Device/Increase time) Ambulation Distance (Feet): 150 Feet Assistive  device: None Gait Pattern/deviations: Step-through pattern;Decreased stride length;Trunk flexed Gait velocity: Decreased   General Gait Details: VC's for improved posture. Pt was able to maintain for ~10 seconds at a time and then reverted back to a flexed posture. PT carried [facility] machine (hospital style too heavy for pt to carry)   Stairs            Wheelchair Mobility    Modified Rankin (Stroke Patients Only)       Balance Overall balance assessment: No apparent balance deficits (not formally assessed)   Sitting balance-Leahy Scale: Normal (able to doff socks and don shoes I'ly)     Standing balance support: No upper extremity supported Standing balance-Leahy Scale: Good                      Cognition Arousal/Alertness: Awake/alert Behavior During Therapy: WFL for tasks assessed/performed Overall Cognitive Status: Within Functional Limits for tasks assessed                      Exercises      General Comments        Pertinent Vitals/Pain Pain Assessment: 0-10 Pain Score: 5  Pain Location: Rt toe (as PtA) Pain Descriptors / Indicators: Nagging;Sore Pain Intervention(s): Limited activity within patient's tolerance;Monitored during session (used shoes to walk)    Home Living                      Prior Function            PT Goals (current goals can now be found in the care plan  section) Acute Rehab PT Goals Patient Stated Goal: walk without device PT Goal Formulation: With patient Time For Goal Achievement: 02/15/15 Potential to Achieve Goals: Good Progress towards PT goals: Progressing toward goals;Goals met/education completed, patient discharged from PT    Frequency  Min 3X/week    PT Plan Discharge plan needs to be updated;Equipment recommendations need to be updated    Co-evaluation             End of Session Equipment Utilized During Treatment: Gait belt Activity Tolerance: Patient tolerated treatment  well Patient left: with call bell/phone within reach;in chair;with nursing/sitter in room   Patient is being discharged from PT services secondary to:  Goals met and no further therapy needs identified.   Please see latest Therapy Progress Note for current level of functioning and progress toward goals.  Progress and discharge plan and discussed with patient/caregiver and they  Agree     Time: 0712-1975 PT Time Calculation (min) (ACUTE ONLY): 14 min  Charges:  $Gait Training: 8-22 mins                    G Codes:      Najee Manninen 20-Feb-2015, 11:01 AM  Pager [phone]

## 2015-02-09 NOTE — Care Management Note (Signed)
Case Management Note Note started by Luz Lex RNCM  Patient Details  Name: Randall Ali. MRN: [id number] Date of Birth: [birth year]  Subjective/Objective:     Patient lives at home alone.  Daughter and one of his three sisters live within a block away. This sister also has a daughter that will be assisting.  Plan for discharge is to go to his house, one of his sisters or niece will be with him during the day and his daughter will be with him at night.  Informed patient he may have the wound vac on when he goes home, feels his family can assist with this.  Continues on oxygen at this time.  Will continue to follow for progression and needs on discharge.  VAC form on chart will need completed if plan is for discharge with VAC.  Will also follow to see if will need IV antibiotics on discharge.                  Action/Plan:   Expected Discharge Date:      02/09/15            Expected Discharge Plan:  [place]  In-House Referral:     Discharge planning Services  CM Consult  Post Acute Care Choice:  Home Health Choice offered to:  Patient, Adult Children  DME Arranged:  Vac DME Agency:  [facility]  HH Arranged:  RN, IV Antibiotics HH Agency:  [facility]  Status of Service:  Completed, signed off  Medicare Important Message Given:  Yes-fourth notification given Date Medicare IM Given:    Medicare IM give by:    Date Additional Medicare IM Given:    Additional Medicare Important Message give by:     If discussed at [place] of Stay Meetings, dates discussed:  02/08/15  Additional Comments:   02/09/15- 1245- Marvetta Gibbons RN, BSN - Have approval from [facility], pt will have a copay cost unsure of amount- have spoken with pt regarding this and pt ok with proceeding - explained to pt that when he gets his bill from [place] that if he needs assistance with bill to call [facility] (number on bill) to ask for assistance and they will work with him. Pt voices understanding  that he will have a copay of unknown amount and that he will get a bill from [facility] and wants to proceed with getting wound VAC- per Rickie with [facility] wound VAC has been released for delivery and will be here this afternoon.  Update: 1430- wound VAC from [facility] is here now, pt also having PICC line exchanged out prior to discharge- [facility] aware of discharge today and start of services for IV abx and wound VAC drsg changes- have updated both Santiago Glad and Pam with [facility]. Have updated Ellwood Handler -PA regarding approval of [facility] wound VAC.   02/08/15- Marvetta Gibbons RN, BSN [phone]-- order in for [facility] for wound VAC needs- ID to see pt for IV abx recommendations pt already has PICC, spoke with pt at bedside regarding [facility] agency of choice- call was made to pt's daughter [name] who states that they want to use [facility]- referral has been called to Santiago Glad with [facility] regarding [place] needs for wound VAC and IV abx- have also spoken with Pam at [facility] regarding IV abx needs- forms for Wound VAC remain on shadow chart- spoke with Ellwood Handler [facility]- who will come sign forms- once completed will f/u with [facility] to start process  for home wound VAC approval.  Update- 1240- wound VAC form signed- spoke to [place] with [facility]- will fax order form and needed info to Rickie to see if insurance will approve [facility] wound VAC- will f/u with Rickie in am.   Plan for ?? IV antibiotics at home - ID to talk with surgeon to decide, already has picc line.  Continues with VAC and will need at home per nurse.  [place] agency list for [employer] given to patient, states would like daughters and sisters input on agency.   Dawayne Patricia, RN 02/09/2015, 2:45 PM Case Management Note  Patient Details  Name: Randall Ali. MRN: [id number] Date of Birth: 02-15-1941  Subjective/Objective:                    Action/Plan:   Expected Discharge Date:                  Expected Discharge Plan:  [place]  In-House Referral:     Discharge planning  Services  CM Consult  Post Acute Care Choice:  Home Health Choice offered to:  Patient, Adult Children  DME Arranged:  Vac DME Agency:  [facility]  HH Arranged:  RN, IV Antibiotics HH Agency:  [facility]  Status of Service:  Completed, signed off  Medicare Important Message Given:  Yes-fourth notification given Date Medicare IM Given:    Medicare IM give by:    Date Additional Medicare IM Given:    Additional Medicare Important Message give by:     If discussed at [place] of Stay Meetings, dates discussed:    Additional Comments:  Dawayne Patricia, RN 02/09/2015, 2:38 PM

## 2015-02-09 NOTE — Telephone Encounter (Signed)
Forward to [name]

## 2015-02-09 NOTE — Care Management Important Message (Signed)
Important Message  Patient Details  Name: Randall Ali. MRN: [id number] Date of Birth: July 20, 1940   Medicare Important Message Given:  Yes-fourth notification given    Nathen May 02/09/2015, 10:32 AMImportant Message  Patient Details  Name: Randall Ali. MRN: [id number] Date of Birth: 1941/05/24   Medicare Important Message Given:  Yes-fourth notification given    Nathen May 02/09/2015, 10:32 AM

## 2015-02-09 NOTE — Progress Notes (Signed)
Speech Language Pathology Treatment: Dysphagia  Patient Details Name: Randall Ali. MRN: [id number] DOB: 1940-08-15 Today's Date: 02/09/2015 Time: 1005-1016 SLP Time Calculation (min) (ACUTE ONLY): 11 min  Assessment / Plan / Recommendation Clinical Impression  Skilled treatment session focused on addressing dysphagia goals.  Patient provided with thin liquids via cup and regular textures which he consumed with use of previously created external aid to assist with recall of safes swallow strategies.  Patient consistently utilized a hard swallow, throat clear and a second hard swallow with all thin liquid sips. Additionally, patient demonstrated no coughing today, which is a functional improvement compared to last visit.  Patient appears to be tolerating current diet and continues to refuse thickener.  SLP educated patient on importance of continued use of safe swallow strategies to minimize aspiration/penetration as well as s/s that would warrant a doctor visit.  Goals met, education complete, SLP signing off with no follow up recommendations at this time.  Patient verbalized understanding of information and in agreement.    HPI Other Pertinent Information: 74 year old male with recent dx of 3 vessel CAD was admitted for CABG 8/17. The procedure was without complication. Hospital course has been complicated by post-op AF, severe delirium requiring precedex, and dyspnea requiring BiPAP for hypoxia. CXR concerning for edema. Intubated 8/23, extubated 8/25.    Pertinent Vitals Pain Assessment: No/denies pain  SLP Plan  All goals met    Recommendations Diet recommendations: Regular;Thin liquid Liquids provided via: Cup;No straw Medication Administration: Crushed with puree Supervision: Patient able to self feed;Intermittent supervision to cue for compensatory strategies Compensations: Small sips/bites;Clear throat intermittently;Multiple dry swallows after each bite/sip Postural Changes  and/or Swallow Maneuvers: Seated upright 90 degrees              Oral Care Recommendations: Oral care BID Follow up Recommendations: None Plan: All goals met    GO    Carmelia Roller., CCC-SLP [phone]  Kathrene Sinopoli 02/09/2015, 10:22 AM

## 2015-02-09 NOTE — Discharge Instructions (Signed)
HOME HEALTH:  1. Please draw weekly CBC, CMP, CRP, ESR and check Vanc Levels- fax results to ID clinic at [phone] 2. Wound vac changes every MWF  Coronary Artery Bypass Grafting, Care After Refer to this sheet in the next few weeks. These instructions provide you with information on caring for yourself after your procedure. Your health care provider may also give you more specific instructions. Your treatment has been planned according to current medical practices, but problems sometimes occur. Call your health care provider if you have any problems or questions after your procedure. WHAT TO EXPECT AFTER THE PROCEDURE Recovery from surgery will be different for everyone. Some people feel well after 3 or 4 weeks, while for others it takes longer. After your procedure, it is typical to have the following:  Nausea and a lack of appetite.   Constipation.  Weakness and fatigue.   Depression or irritability.   Pain or discomfort at your incision site. HOME CARE INSTRUCTIONS  Take medicines only as directed by your health care provider. Do not stop taking medicines or start any new medicines without first checking with your health care provider.  Take your pulse as directed by your health care provider.  Perform deep breathing as directed by your health care provider. If you were given a device called an incentive spirometer, use it to practice deep breathing several times a day. Support your chest with a pillow or your arms when you take deep breaths or cough.  Keep incision areas clean, dry, and protected. Remove or change any bandages (dressings) only as directed by your health care provider. You may have skin adhesive strips over the incision areas. Do not take the strips off. They will fall off on their own.  Check incision areas daily for any swelling, redness, or drainage.  If incisions were made in your legs, do the following:  Avoid crossing your legs.   Avoid sitting  for long periods of time. Change positions every 30 minutes.   Elevate your legs when you are sitting.  Wear compression stockings as directed by your health care provider. These stockings help keep blood clots from forming in your legs.  Take showers once your health care provider approves. Until then, only take sponge baths. Pat incisions dry. Do not rub incisions with a washcloth or towel. Do not take baths, swim, or use a hot tub until your health care provider approves.  Eat foods that are high in fiber, such as raw fruits and vegetables, whole grains, beans, and nuts. Meats should be lean cut. Avoid canned, processed, and fried foods.  Drink enough fluid to keep your urine clear or pale yellow.  Weigh yourself every day. This helps identify if you are retaining fluid that may make your heart and lungs work harder.  Rest and limit activity as directed by your health care provider. You may be instructed to:  Stop any activity at once if you have chest pain, shortness of breath, irregular heartbeats, or dizziness. Get help right away if you have any of these symptoms.  Move around frequently for short periods or take short walks as directed by your health care provider. Increase your activities gradually. You may need physical therapy or cardiac rehabilitation to help strengthen your muscles and build your endurance.  Avoid lifting, pushing, or pulling anything heavier than 10 lb (4.5 kg) for at least 6 weeks after surgery.  Do not drive until your health care provider approves.  Ask your health care provider  when you may return to work.  Ask your health care provider when you may resume sexual activity.  Keep all follow-up visits as directed by your health care provider. This is important. SEEK MEDICAL CARE IF:  You have swelling, redness, increasing pain, or drainage at the site of an incision.  You have a fever.  You have swelling in your ankles or legs.  You have pain in  your legs.   You gain 2 or more pounds (0.9 kg) a day.  You are nauseous or vomit.  You have diarrhea. SEEK IMMEDIATE MEDICAL CARE IF:  You have chest pain that goes to your jaw or arms.  You have shortness of breath.   You have a fast or irregular heartbeat.   You notice a "clicking" in your breastbone (sternum) when you move.   You have numbness or weakness in your arms or legs.  You feel dizzy or light-headed.  MAKE SURE YOU:  Understand these instructions.  Will watch your condition.  Will get help right away if you are not doing well or get worse. Document Released: 12/13/2004 Document Revised: 10/10/2013 Document Reviewed: 11/02/2012 [facility] Patient Information 2015 [place], [place]. This information is not intended to replace advice given to you by your health care provider. Make sure you discuss any questions you have with your health care provider.

## 2015-02-09 NOTE — Consult Note (Signed)
   [facility] CM Inpatient Consult   02/09/2015  Randall Ali. 11/09/1940 [id number]    Follow up visit for [place] Management. Spoke with [name] at [facility] to discharge, to discuss and explain [place] Management services. Consents obtained. Explained that [place] Management will not interfere with his services thru [place]. Explained that he will receive post hospital transition of care calls and will be evaluated for monthly home visits. Confirmed best contact number as [phone]. Will request for patient to be assigned [place].  Marthenia Rolling, MSN-Ed, RN,BSN [facility] Liaison [phone]

## 2015-02-10 DIAGNOSIS — Z48812 Encounter for surgical aftercare following surgery on the circulatory system: Secondary | ICD-10-CM | POA: Diagnosis not present

## 2015-02-12 ENCOUNTER — Other Ambulatory Visit
Admission: RE | Admit: 2015-02-12 | Discharge: 2015-02-12 | Disposition: A | Discharge status: Home / Self Care | Payer: PPO | Attending: Internal Medicine | Admitting: Internal Medicine

## 2015-02-12 ENCOUNTER — Telehealth: Payer: Self-pay | Admitting: Thoracic Surgery (Cardiothoracic Vascular Surgery)

## 2015-02-12 DIAGNOSIS — Z139 Encounter for screening, unspecified: Secondary | ICD-10-CM | POA: Insufficient documentation

## 2015-02-12 LAB — SEDIMENTATION RATE: Sed Rate: 79 mm/hr — ABNORMAL HIGH (ref 0–16)

## 2015-02-12 LAB — BASIC METABOLIC PANEL
Anion gap: 11 (ref 5–15)
BUN: 16 mg/dL (ref 6–20)
CO2: 29 mmol/L (ref 22–32)
Calcium: 8.5 mg/dL — ABNORMAL LOW (ref 8.9–10.3)
Chloride: 94 mmol/L — ABNORMAL LOW (ref 101–111)
Creatinine, Ser: 1.06 mg/dL (ref 0.61–1.24)
GFR calc Af Amer: >60 mL/min (ref >60)
GFR calc non Af Amer: >60 mL/min (ref >60)
Glucose, Bld: 114 mg/dL — ABNORMAL HIGH (ref 65–99)
Potassium: 3.5 mmol/L (ref 3.5–5.1)
Sodium: 134 mmol/L — ABNORMAL LOW (ref 135–145)

## 2015-02-12 LAB — CBC WITH DIFFERENTIAL/PLATELET
Basophils Absolute: 0.1 10*3/uL (ref 0–0.1)
Basophils Relative: 1 %
Eosinophils Absolute: 0.1 10*3/uL (ref 0–0.7)
Eosinophils Relative: 1 %
HCT: 34.2 % — ABNORMAL LOW (ref 40.0–52.0)
Hemoglobin: 11.1 g/dL — ABNORMAL LOW (ref 13.0–18.0)
Lymphocytes Relative: 14 %
Lymphs Abs: 1.5 10*3/uL (ref 1.0–3.6)
MCH: 29.1 pg (ref 26.0–34.0)
MCHC: 32.4 g/dL (ref 32.0–36.0)
MCV: 89.7 fL (ref 80.0–100.0)
Monocytes Absolute: 1.5 10*3/uL — ABNORMAL HIGH (ref 0.2–1.0)
Monocytes Relative: 14 %
Neutro Abs: 7.4 10*3/uL — ABNORMAL HIGH (ref 1.4–6.5)
Neutrophils Relative %: 70 %
Platelets: 418 10*3/uL (ref 150–440)
RBC: 3.81 MIL/uL — ABNORMAL LOW (ref 4.40–5.90)
RDW: 14 % (ref 11.5–14.5)
WBC: 10.6 10*3/uL (ref 3.8–10.6)

## 2015-02-12 NOTE — Telephone Encounter (Signed)
No BM in 5 days  Recommended miralax. If no result with miralax- Fleet enema  Revonda Standard. Roxan Hockey, MD [facility] [phone]

## 2015-02-13 NOTE — Patient Outreach (Signed)
[name]  02/13/2015  Randall Ali. [birth year] [id number]   Referral from Jari Pigg, RN, assigned [name], RN.  Thanks, [name]. [place], [place] Assistant Phone: [phone] Fax: [phone]

## 2015-02-14 ENCOUNTER — Other Ambulatory Visit: Payer: Self-pay | Admitting: Cardiothoracic Surgery

## 2015-02-14 ENCOUNTER — Ambulatory Visit
Admission: RE | Admit: 2015-02-14 | Discharge: 2015-02-14 | Disposition: A | Discharge status: Home / Self Care | Payer: PPO | Source: Ambulatory Visit | Attending: Cardiothoracic Surgery | Admitting: Cardiothoracic Surgery

## 2015-02-14 ENCOUNTER — Encounter: Payer: Self-pay | Admitting: Cardiothoracic Surgery

## 2015-02-14 ENCOUNTER — Ambulatory Visit (INDEPENDENT_AMBULATORY_CARE_PROVIDER_SITE_OTHER): Payer: Self-pay | Admitting: Cardiothoracic Surgery

## 2015-02-14 VITALS — BP 159/85 | HR 84 | Temp 98.5°F | Resp 16 | Ht 68.0 in | Wt 191.0 lb

## 2015-02-14 DIAGNOSIS — Z951 Presence of aortocoronary bypass graft: Secondary | ICD-10-CM

## 2015-02-14 DIAGNOSIS — I25110 Atherosclerotic heart disease of native coronary artery with unstable angina pectoris: Secondary | ICD-10-CM

## 2015-02-14 NOTE — Telephone Encounter (Signed)
Could stay on simvastatin if he prefers LDL slightly elevated, perhaps could tolerate slightly higher dose  40 mg daily if tolerated

## 2015-02-14 NOTE — Progress Notes (Signed)
[address]       [place] [address]             [phone]      [name] [place] Medical Record #[id number] Date of Birth: November 29, 1940  Referring: [name], MD Primary Care: [name], MD  Chief Complaint:   POST OP FOLLOW UP 01/24/2015  OPERATIVE REPORT PREOPERATIVE DIAGNOSIS: Coronary occlusive disease with recent onset of angina. 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 SURGICAL PROCEDURE: Coronary artery bypass grafting x4, with left internal mammary to the left anterior descending coronary artery, sequential reverse saphenous vein graft to the first and second intermediate coronary artery, reverse saphenous vein graft to the posterior descending coronary artery, with the left leg greater saphenous thigh and calf endo vein harvesting. SURGEON: Lanelle Bal, M.D.  History of Present Illness:     Patient is making good progress since discharge home, after coronary artery bypass grafting complicated by gram-negative wound incision infection, treated by opening the wound and has had a wound VAC in place. He's had no fever chills. He has some discomfort over the right upper side of this chest. He notes that his appetite is significantly improved.     Past Medical History  Diagnosis Date  . CAD (coronary artery disease)     s/p inferior wall infarct in 10/01. has stent in Rt coronary artery. is due a stress myoview. does have some dyspnea on exertion  . SOB (shortness of breath) on exertion   . 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 Chest pain   . HLD (hyperlipidemia)     isdue followup lipids. on zocor 10 mg/day   . HTN (hypertension)   . Allergy     seasonal  . Cataract     removed  . Reflux esophagitis   . OSA (obstructive sleep apnea) 01/13/2015  . Erectile dysfunction   . Arthritis     all over- in general   . Cancer     skin, melanoma  .  GERD (gastroesophageal reflux disease)     pt. denies      History  Smoking status  . Former Smoker -- 2.50 packs/day for 40 years  . Types: Cigarettes  . Quit date: 03/24/2000  Smokeless tobacco  . Never Used    History  Alcohol Use  . 6.0 oz/week  . 10 Shots of liquor per week    Comment: occasional- 2 times per week      No Known Allergies  Current Outpatient Prescriptions  Medication Sig Dispense Refill  . amiodarone (PACERONE) 200 MG tablet Take 1 tablet (200 mg total) by mouth 2 (two) times daily. X 7 days, then decrease to 200 mg BID x 7 days, 200 mg daily 90 tablet 1  . aspirin 81 MG EC tablet Take 81 mg by mouth daily.      . Calcium-Vitamin D (RA CALCIUM PLUS VITAMIN D) 600-125 MG-UNIT TABS Take 1 tablet by mouth 2 (two) times daily.     . cefTRIAXone 2 g in dextrose 5 % 50 mL Inject 2 g into the vein daily. 35 each 0  . furosemide (LASIX) 20 MG tablet Take 1 tablet (20 mg total) by mouth daily. For 5 days 5 tablet 0  . metoprolol tartrate (LOPRESSOR) 25 MG tablet Take 0.5 tablets (12.5 mg total) by mouth 2 (two) times daily. 30 tablet 3  . oxyCODONE (OXY  IR/ROXICODONE) 5 MG immediate release tablet Take 1 tablet (5 mg total) by mouth every 4 (four) hours as needed for severe pain. 30 tablet 0  . Probiotic Product (PROBIOTIC PO) Take 2 tablets by mouth daily. accuflora    . simvastatin (ZOCOR) 20 MG tablet Take 1 tablet (20 mg total) by mouth at bedtime. 90 tablet 1  . tadalafil (CIALIS) 20 MG tablet Take 1 tablet (20 mg total) by mouth daily as needed for erectile dysfunction. 10 tablet 0  . traMADol (ULTRAM) 50 MG tablet Take 1 tablet (50 mg total) by mouth every 6 (six) hours as needed. (Patient taking differently: Take 50 mg by mouth every 6 (six) hours as needed for moderate pain. ) 60 tablet 5  . vitamin B-12 (CYANOCOBALAMIN) 1000 MCG tablet Take 1,000 mcg by mouth daily.    . vitamin C (ASCORBIC ACID) 500 MG tablet Take 500 mg by mouth daily.    . Zinc 100 MG  TABS Take 1 tablet by mouth daily.      [name] zolpidem (AMBIEN) 10 MG tablet TAKE ONE TABLET AT BEDTIME AS NEEDED FORSLEEP (Patient taking differently: Take 10 mg by mouth at bedtime as needed for sleep. ) 15 tablet 3   No current facility-administered medications for this visit.       Physical Exam: BP 159/85 mmHg  Pulse 84  Temp(Src) 98.5 F (36.9 C) (Oral)  Resp 16  Ht '5\' 8"'$  (1.727 m)  Wt 191 lb (86.637 kg)  BMI 29.05 kg/m2  SpO2 93%  General appearance: alert and cooperative Neurologic: intact Heart: regular rate and rhythm, S1, S2 normal, no murmur, click, rub or gallop Lungs: clear to auscultation bilaterally Abdomen: soft, non-tender; bowel sounds normal; no masses,  no organomegaly Extremities: extremities normal, atraumatic, no cyanosis or edema and Homans sign is negative, no sign of DVT Wound: Patient VAC was removed in the office today, the lower incision is granulating well there is no exposed bone. There is been asked and contraction of the wound. The upper incision is fully healed. The bone is stable  The VAC was changed in the office today  Diagnostic Studies & Laboratory data:     Recent Radiology Findings:   Dg Chest 2 View  02/14/2015   CLINICAL DATA:  Right-sided chest pain status post coronary artery bypass graft.  EXAM: CHEST  2 VIEW  COMPARISON:  February 03, 2015.  FINDINGS: Status post coronary artery bypass graft. Mild multilevel degenerative disc disease is noted in lower thoracic spine. No pneumothorax is noted. Minimal bilateral pleural effusions are noted. Right-sided PICC line is again noted with distal tip at expected position of cavoatrial junction. Mild right basilar subsegmental atelectasis is noted. Left lung is clear.  IMPRESSION: Mild right basilar subsegmental atelectasis. Minimal bilateral pleural effusions.   Electronically Signed   By: Marijo Conception, M.D.   On: 02/14/2015 16:29      Recent Lab Findings: Lab Results  Component Value Date    WBC 10.6 02/12/2015   HGB 11.1* 02/12/2015   HCT 34.2* 02/12/2015   PLT 418 02/12/2015   GLUCOSE 114* 02/12/2015   CHOL 144 11/24/2014   TRIG 71.0 11/24/2014   HDL 46.90 11/24/2014   LDLDIRECT 78.0 04/05/2013   LDLCALC 83 11/24/2014   ALT 63 02/02/2015   AST 50* 02/02/2015   NA 134* 02/12/2015   K 3.5 02/12/2015   CL 94* 02/12/2015   CREATININE 1.06 02/12/2015   BUN 16 02/12/2015   CO2 29 02/12/2015  TSH 3.318 02/02/2015   INR 1.34 01/24/2015   HGBA1C 5.8* 01/22/2015      Assessment / Plan:     We'll continue Monday Wednesday Friday VAC changes Continues with PICC line and IV antibody therapy outlined by infectious disease Plan to see him back in one week on Wednesday to reevaluate the wound. We'll plan to refer to cardiac rehabilitation when his wound is more completely healed.    [name] MD      [place] [place] [address] Office [phone]   Beeper [phone]  02/14/2015 5:49 PM

## 2015-02-14 NOTE — Telephone Encounter (Signed)
Left message for pt to call back  °

## 2015-02-20 NOTE — Patient Outreach (Signed)
[facility]) Care Management  02/20/2015  Randall Ali. May 06, 1941 [id number]   Referral from Randall Ali, RNCM reassigned to Randall Lair, NP for patient outreach.  Randall Ali, [place] Care Management Assistant

## 2015-02-21 ENCOUNTER — Other Ambulatory Visit: Payer: Self-pay | Admitting: *Deleted

## 2015-02-21 ENCOUNTER — Encounter: Payer: Self-pay | Admitting: Cardiothoracic Surgery

## 2015-02-21 ENCOUNTER — Ambulatory Visit (INDEPENDENT_AMBULATORY_CARE_PROVIDER_SITE_OTHER): Payer: Self-pay | Admitting: Cardiothoracic Surgery

## 2015-02-21 VITALS — BP 141/77 | HR 76 | Resp 16 | Ht 68.0 in | Wt 189.0 lb

## 2015-02-21 DIAGNOSIS — T798XXD Other early complications of trauma, subsequent encounter: Secondary | ICD-10-CM

## 2015-02-21 DIAGNOSIS — Z951 Presence of aortocoronary bypass graft: Secondary | ICD-10-CM

## 2015-02-21 DIAGNOSIS — I25110 Atherosclerotic heart disease of native coronary artery with unstable angina pectoris: Secondary | ICD-10-CM

## 2015-02-21 DIAGNOSIS — G8918 Other acute postprocedural pain: Secondary | ICD-10-CM

## 2015-02-21 MED ORDER — OXYCODONE HCL 5 MG PO TABS: 5.0000 mg | ORAL_TABLET | ORAL | Status: DC | PRN

## 2015-02-21 NOTE — Progress Notes (Signed)
[name] comes to the office for a wound vac change to his sternal wound. s/p CABG. Due to Dr. Everrett Coombe unavailability , I changed the vac dressing. On exam, the wound continues to have a good beefy red base.  There is no drainage or signs of infection.  He presently has a wet to dry saline dressing due to the fact that he had trouble with the vac last night with a continuous leak. He was told by the [facility] to apply the w-d dressing until seen today. I did have to explore the wound to open areas that had already closed and there is an area as before at the distal end that goes deeper than the rest of the wound.  I also removed some loose Vicryl suture.  He tolerated the procedure well and will return next Wednesday for same.

## 2015-02-23 ENCOUNTER — Encounter: Payer: Self-pay | Admitting: Cardiothoracic Surgery

## 2015-02-26 ENCOUNTER — Other Ambulatory Visit: Payer: Self-pay | Admitting: *Deleted

## 2015-02-26 NOTE — Patient Outreach (Signed)
Telephone outreach. Pt is s/p CABG one month ago. He is receiving home health for chest wound care from [facility]. He has had his follow up visits and is scheduled for his round 2 follow ups. He reports he is very fatigued. I explained that he has had major surgery and it is going to take some time to get his strength back. He did say they plan on sending him to cardiac rehab as soon as his wound has healed.  I have encouraged him to call his MD or home health nurse if any problems arise. I gave him my name and number and also our 24 hour nurse line number.  I will call him back in one week.  Deloria Lair [facility] [place] [phone]

## 2015-02-28 ENCOUNTER — Encounter: Payer: Self-pay | Admitting: Cardiothoracic Surgery

## 2015-02-28 ENCOUNTER — Ambulatory Visit (INDEPENDENT_AMBULATORY_CARE_PROVIDER_SITE_OTHER): Payer: Self-pay | Admitting: Cardiothoracic Surgery

## 2015-02-28 ENCOUNTER — Telehealth: Payer: Self-pay | Admitting: *Deleted

## 2015-02-28 VITALS — BP 136/76 | HR 74 | Temp 97.6°F | Resp 16 | Ht 68.0 in | Wt 190.0 lb

## 2015-02-28 DIAGNOSIS — T798XXD Other early complications of trauma, subsequent encounter: Secondary | ICD-10-CM

## 2015-02-28 DIAGNOSIS — Z951 Presence of aortocoronary bypass graft: Secondary | ICD-10-CM

## 2015-02-28 NOTE — Progress Notes (Signed)
[address]       [place] [address]             [phone]      Randall Kung. 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 SURGICAL PROCEDURE: Coronary artery bypass grafting x4, with left internal mammary to the left anterior descending coronary artery, sequential reverse saphenous vein graft to the first and second intermediate coronary artery, reverse saphenous vein graft to the posterior descending coronary artery, with the left leg greater saphenous thigh and calf endo vein harvesting. SURGEON: Lanelle Bal, M.D.  History of Present Illness:     Patient is making good progress since discharge home, after coronary artery bypass grafting complicated by gram-negative wound incision infection, treated by opening the wound and has had a wound VAC in place. He's had no fever chills. Complains of some trouble sleeping. He increase his activity appropriately and once to start driving.    Past Medical History  Diagnosis Date  . CAD (coronary artery disease)     s/p inferior wall infarct in 10/01. has stent in Rt coronary artery. is due a stress myoview. does have some dyspnea on exertion  . SOB (shortness of breath) on exertion   . 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 Chest pain   . HLD (hyperlipidemia)     isdue followup lipids. on zocor 10 mg/day   . HTN (hypertension)   . Allergy     seasonal  . Cataract     removed  . Reflux esophagitis   . OSA (obstructive sleep apnea) 01/13/2015  . Erectile dysfunction   . Arthritis     all over- in general   . Cancer     skin, melanoma  . GERD  (gastroesophageal reflux disease)     pt. denies      History  Smoking status  . Former Smoker -- 2.50 packs/day for 40 years  . Types: Cigarettes  . Quit date: 03/24/2000  Smokeless tobacco  . Never Used    History  Alcohol Use  . 6.0 oz/week  . 10 Shots of liquor per week    Comment: occasional- 2 times per week      No Known Allergies  Current Outpatient Prescriptions  Medication Sig Dispense Refill  . amiodarone (PACERONE) 200 MG tablet Take 1 tablet (200 mg total) by mouth 2 (two) times daily. X 7 days, then decrease to 200 mg BID x 7 days, 200 mg daily 90 tablet 1  . aspirin 81 MG EC tablet Take 81 mg by mouth daily.      . Calcium-Vitamin D (RA CALCIUM PLUS VITAMIN D) 600-125 MG-UNIT TABS Take 1 tablet by mouth 2 (two) times daily.     . cefTRIAXone 2 g in dextrose 5 % 50 mL Inject 2 g into the vein daily. 35 each 0  . metoprolol tartrate (LOPRESSOR) 25 MG tablet Take 0.5 tablets (12.5 mg total) by mouth 2 (two) times daily. 30 tablet 3  . oxyCODONE (OXY IR/ROXICODONE) 5 MG immediate release tablet Take 1 tablet (5 mg total) by mouth every 4 (four) hours as needed for severe pain. 40 tablet 0  .  Probiotic Product (PROBIOTIC PO) Take 2 tablets by mouth daily. accuflora    . simvastatin (ZOCOR) 20 MG tablet Take 1 tablet (20 mg total) by mouth at bedtime. 90 tablet 1  . tadalafil (CIALIS) 20 MG tablet Take 1 tablet (20 mg total) by mouth daily as needed for erectile dysfunction. 10 tablet 0  . traMADol (ULTRAM) 50 MG tablet Take 1 tablet (50 mg total) by mouth every 6 (six) hours as needed. (Patient taking differently: Take 50 mg by mouth every 6 (six) hours as needed for moderate pain. ) 60 tablet 5  . vitamin B-12 (CYANOCOBALAMIN) 1000 MCG tablet Take 1,000 mcg by mouth daily.    . vitamin C (ASCORBIC ACID) 500 MG tablet Take 500 mg by mouth daily.    . Zinc 100 MG TABS Take 1 tablet by mouth daily.      [name] zolpidem (AMBIEN) 10 MG tablet TAKE ONE TABLET AT BEDTIME AS  NEEDED FORSLEEP (Patient taking differently: Take 10 mg by mouth at bedtime as needed for sleep. ) 15 tablet 3   No current facility-administered medications for this visit.       Physical Exam: BP 136/76 mmHg  Pulse 74  Temp(Src) 97.6 F (36.4 C) (Oral)  Resp 16  Ht '5\' 8"'$  (1.727 m)  Wt 190 lb (86.183 kg)  BMI 28.90 kg/m2  SpO2 96%  General appearance: alert and cooperative Neurologic: intact Heart: regular rate and rhythm, S1, S2 normal, no murmur, click, rub or gallop Lungs: clear to auscultation bilaterally Abdomen: soft, non-tender; bowel sounds normal; no masses,  no organomegaly Extremities: extremities normal, atraumatic, no cyanosis or edema and Homans sign is negative, no sign of DVT Wound: Patient VAC was removed in the office today, the lower incision is granulating well there is no exposed bone. The upper incision is fully healed. The bone is stable. The wound continues to contract well without any surrounding erythema.  The VAC was changed in the office today, I suspect by next week he will no longer need the [facility]  Diagnostic Studies & Laboratory data:     Recent Radiology Findings:   No results found.    Recent Lab Findings: Lab Results  Component Value Date   WBC 10.6 02/12/2015   HGB 11.1* 02/12/2015   HCT 34.2* 02/12/2015   PLT 418 02/12/2015   GLUCOSE 114* 02/12/2015   CHOL 144 11/24/2014   TRIG 71.0 11/24/2014   HDL 46.90 11/24/2014   LDLDIRECT 78.0 04/05/2013   LDLCALC 83 11/24/2014   ALT 63 02/02/2015   AST 50* 02/02/2015   NA 134* 02/12/2015   K 3.5 02/12/2015   CL 94* 02/12/2015   CREATININE 1.06 02/12/2015   BUN 16 02/12/2015   CO2 29 02/12/2015   TSH 3.318 02/02/2015   INR 1.34 01/24/2015   HGBA1C 5.8* 01/22/2015   Repeat labs were done through lab core on 02/26/2015 glucose was elevated to 1:15 BUN 16 white count 6.7 hemoglobin 11.6 hematocrit 35.1 his sedimentation rate was elevated at 60 C reactive protein 21.5   Assessment /  Plan:     We'll continue Monday Wednesday Friday VAC changes Continues with PICC line and IV antibody therapy outlined by infectious disease until October 6 Plan to see him back in one week on Wednesday to reevaluate the wound. Suspect by next week he will not need the back We'll plan to refer to cardiac rehabilitation when his wound is more completely healed.    [name] MD  [address] [place] [address] Office [phone]   Beeper [phone]  02/28/2015 3:25 PM

## 2015-02-28 NOTE — Telephone Encounter (Signed)
Pt called requesting Ambien refill.  Last OV 8.4.16.  Please advise refill

## 2015-02-28 NOTE — Telephone Encounter (Signed)
I cannot fill it while he is taking oxycodone because the risk of overdose and respiratory depression is too high. Is he still taking oxycodoen daily?

## 2015-03-01 NOTE — Telephone Encounter (Signed)
Patient stated he is still taking the oxycodone and understands not refill Ambien at this time, due to the risk of respiratory depression.

## 2015-03-05 ENCOUNTER — Other Ambulatory Visit: Payer: Self-pay | Admitting: *Deleted

## 2015-03-05 NOTE — Patient Outreach (Signed)
Transition of care call #2 - Pt was not at home and I left a message to kindly return my call. (11:45 am)

## 2015-03-07 ENCOUNTER — Other Ambulatory Visit: Payer: Self-pay | Admitting: Cardiothoracic Surgery

## 2015-03-07 ENCOUNTER — Other Ambulatory Visit: Payer: Self-pay | Admitting: *Deleted

## 2015-03-07 DIAGNOSIS — Z951 Presence of aortocoronary bypass graft: Secondary | ICD-10-CM

## 2015-03-07 NOTE — Patient Outreach (Signed)
Transition of care call #2 - Pt states he feels "terrible." He reports general malaise, extreme fatigue, warm feeling in his head. He says everyone has told him it is the IV antibiotics he is one. His chest wound vac was removed on Monday. His home health nurse will return in one week for follow up. Pt has all his meds and is taking them as prescribed. He denies frank chest pain, he says he is just sore. He denies SOB and edema. He is weighing every day and his wt. Is stable 185-187. He will see Dr. Con Memos, the surgeon tomorrow. I have asked him to make sure he reports the above compliants to him. He will see his cardiologist mid month and his primary early November. I have encouraged him to call me if he has any problems or questions. I will call him again next week.  Deloria Lair [facility] [place] [phone]

## 2015-03-08 ENCOUNTER — Encounter: Payer: Self-pay | Admitting: Cardiothoracic Surgery

## 2015-03-08 ENCOUNTER — Ambulatory Visit
Admission: RE | Admit: 2015-03-08 | Discharge: 2015-03-08 | Disposition: A | Discharge status: Home / Self Care | Payer: PPO | Source: Ambulatory Visit | Attending: Cardiothoracic Surgery | Admitting: Cardiothoracic Surgery

## 2015-03-08 ENCOUNTER — Other Ambulatory Visit: Payer: PPO | Admitting: *Deleted

## 2015-03-08 ENCOUNTER — Ambulatory Visit (INDEPENDENT_AMBULATORY_CARE_PROVIDER_SITE_OTHER): Payer: Self-pay | Admitting: Cardiothoracic Surgery

## 2015-03-08 VITALS — BP 132/77 | HR 75 | Temp 97.7°F | Resp 16 | Ht 68.0 in | Wt 190.0 lb

## 2015-03-08 DIAGNOSIS — Z951 Presence of aortocoronary bypass graft: Secondary | ICD-10-CM

## 2015-03-08 DIAGNOSIS — T798XXD Other early complications of trauma, subsequent encounter: Secondary | ICD-10-CM

## 2015-03-08 NOTE — Progress Notes (Signed)
[address]       [place] [address]             [phone]      Randall Ali. 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 SURGICAL PROCEDURE: Coronary artery bypass grafting x4, with left internal mammary to the left anterior descending coronary artery, sequential reverse saphenous vein graft to the first and second intermediate coronary artery, reverse saphenous vein graft to the posterior descending coronary artery, with the left leg greater saphenous thigh and calf endo vein harvesting. SURGEON: Lanelle Bal, M.D.  History of Present Illness:     Patient is making good progress since discharge home, after coronary artery bypass grafting complicated by gram-negative wound incision infection, treated by opening the wound and has had a wound VAC in place. He's had no fever . Complains of some trouble sleeping and feeling tired. He increase his activity appropriately once his PICC line has been removed he will start cardiac rehabilitation    Past Medical History  Diagnosis Date  . CAD (coronary artery disease)     s/p inferior wall infarct in 10/01. has stent in Rt coronary artery. is due a stress myoview. does have some dyspnea on exertion  . SOB (shortness of breath) on exertion   . 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 Chest pain   . HLD (hyperlipidemia)     isdue followup lipids. on zocor 10 mg/day   . HTN (hypertension)   . Allergy     seasonal  . Cataract     removed  . Reflux esophagitis   . OSA (obstructive sleep apnea) 01/13/2015  . Erectile dysfunction   . Arthritis     all over- in  general   . Cancer     skin, melanoma  . GERD (gastroesophageal reflux disease)     pt. denies      History  Smoking status  . Former Smoker -- 2.50 packs/day for 40 years  . Types: Cigarettes  . Quit date: 03/24/2000  Smokeless tobacco  . Never Used    History  Alcohol Use  . 6.0 oz/week  . 10 Shots of liquor per week    Comment: occasional- 2 times per week      No Known Allergies  Current Outpatient Prescriptions  Medication Sig Dispense Refill  . traMADol (ULTRAM) 50 MG tablet Take 1 tablet (50 mg total) by mouth every 6 (six) hours as needed. (Patient taking differently: Take 50 mg by mouth every 6 (six) hours as needed for moderate pain. ) 60 tablet 5  . amiodarone (PACERONE) 200 MG tablet Take 1 tablet (200 mg total) by mouth 2 (two) times daily. X 7 days, then decrease to 200 mg BID x 7 days, 200 mg daily 90 tablet 1  . aspirin 81 MG EC tablet Take 81 mg by mouth daily.      . Calcium-Vitamin D (RA CALCIUM PLUS VITAMIN D) 600-125 MG-UNIT TABS Take 1 tablet by mouth 2 (two) times daily.     . cefTRIAXone 2 g in dextrose 5 % 50 mL Inject 2 g into the vein daily. 35 each 0  . metoprolol  tartrate (LOPRESSOR) 25 MG tablet Take 0.5 tablets (12.5 mg total) by mouth 2 (two) times daily. (Patient not taking: Reported on 03/07/2015) 30 tablet 3  . Probiotic Product (PROBIOTIC PO) Take 2 tablets by mouth daily. accuflora    . simvastatin (ZOCOR) 20 MG tablet Take 1 tablet (20 mg total) by mouth at bedtime. 90 tablet 1  . tadalafil (CIALIS) 20 MG tablet Take 1 tablet (20 mg total) by mouth daily as needed for erectile dysfunction. (Patient not taking: Reported on 03/07/2015) 10 tablet 0  . vitamin B-12 (CYANOCOBALAMIN) 1000 MCG tablet Take 1,000 mcg by mouth daily.    . vitamin C (ASCORBIC ACID) 500 MG tablet Take 500 mg by mouth daily.    . Zinc 100 MG TABS Take 1 tablet by mouth daily.       No current facility-administered medications for this visit.       Physical  Exam: BP 132/77 mmHg  Pulse 75  Temp(Src) 97.7 F (36.5 C) (Oral)  Resp 16  Ht '5\' 8"'$  (1.727 m)  Wt 190 lb (86.183 kg)  BMI 28.90 kg/m2  SpO2 97%  General appearance: alert and cooperative Neurologic: intact Heart: regular rate and rhythm, S1, S2 normal, no murmur, click, rub or gallop Lungs: clear to auscultation bilaterally Abdomen: soft, non-tender; bowel sounds normal; no masses,  no organomegaly Extremities: extremities normal, atraumatic, no cyanosis or edema and Homans sign is negative, no sign of DVT Wound: The patient sternotomy incision is now completely healed, there is no surrounding erythema or evidence of infection, on exam the bone is stable  Diagnostic Studies & Laboratory data:     Recent Radiology Findings:   Dg Chest 2 View  03/08/2015   CLINICAL DATA:  CABG 1 month ago.  Persistent weakness.  EXAM: CHEST - 2 VIEW  COMPARISON:  Two-view chest x-ray 02/14/2015.  FINDINGS: Heart size is normal. Median sternotomy for CABG is noted. Previously seen pleural effusions have resolved. The basilar airspace disease has resolved as well. Mild chronic interstitial coarsening is stable. Right-sided PICC line is stable. Degenerative changes in the thoracic spine are again noted. Mild rightward curvature is evident.  IMPRESSION: 1. No acute cardiopulmonary disease. 2. Interval resolution of bilateral effusions and bibasilar airspace disease.   Electronically Signed   By: San Morelle M.D.   On: 03/08/2015 16:02      Recent Lab Findings: Lab Results  Component Value Date   WBC 10.6 02/12/2015   HGB 11.1* 02/12/2015   HCT 34.2* 02/12/2015   PLT 418 02/12/2015   GLUCOSE 114* 02/12/2015   CHOL 144 11/24/2014   TRIG 71.0 11/24/2014   HDL 46.90 11/24/2014   LDLDIRECT 78.0 04/05/2013   LDLCALC 83 11/24/2014   ALT 63 02/02/2015   AST 50* 02/02/2015   NA 134* 02/12/2015   K 3.5 02/12/2015   CL 94* 02/12/2015   CREATININE 1.06 02/12/2015   BUN 16 02/12/2015   CO2 29  02/12/2015   TSH 3.318 02/02/2015   INR 1.34 01/24/2015   HGBA1C 5.8* 01/22/2015   Repeat labs were done through lab core on 02/26/2015 glucose was elevated to 1:15 BUN 16 white count 6.7 hemoglobin 11.6 hematocrit 35.1 his sedimentation rate was elevated at 60 C reactive protein 21.5   Assessment / Plan:   Continues with PICC line and IV antibody therapy outlined by infectious disease until October 6 Plan to see him back in one week on Wednesday  We'll plan to refer to cardiac rehabilitation when he  has completed anabolic and his PICC line has been removed    [name] MD      [place] [address] [place] [address] Office [phone]   Beeper [phone]  03/08/2015 4:39 PM

## 2015-03-12 ENCOUNTER — Other Ambulatory Visit: Payer: Self-pay | Admitting: *Deleted

## 2015-03-12 NOTE — Patient Outreach (Signed)
Transition of care call #3. Randall Ali is feeling better this week than last. He is looking forward to getting the PICC line removed this week. He will see his cardiologist again 03/27/15 and his primary care provider on 04/13/15. He is anxious to get clearance to go to the gym and to start cardiac rehab. He reports his surgical wound is closed. There is one small area that remains a bit moist which he covers with a band-aid. Pt denies any problems except fatigue and general malaise. I will call him again next week for his final transition of care call.  Deloria Lair [facility] [place] [phone]

## 2015-03-14 ENCOUNTER — Ambulatory Visit: Payer: PPO | Admitting: *Deleted

## 2015-03-15 ENCOUNTER — Ambulatory Visit (INDEPENDENT_AMBULATORY_CARE_PROVIDER_SITE_OTHER): Payer: Self-pay | Admitting: Cardiothoracic Surgery

## 2015-03-15 ENCOUNTER — Encounter: Payer: Self-pay | Admitting: Cardiothoracic Surgery

## 2015-03-15 VITALS — BP 129/78 | HR 67 | Resp 20 | Ht 68.0 in | Wt 190.0 lb

## 2015-03-15 DIAGNOSIS — T798XXD Other early complications of trauma, subsequent encounter: Secondary | ICD-10-CM

## 2015-03-15 DIAGNOSIS — I25110 Atherosclerotic heart disease of native coronary artery with unstable angina pectoris: Secondary | ICD-10-CM

## 2015-03-15 DIAGNOSIS — Z951 Presence of aortocoronary bypass graft: Secondary | ICD-10-CM

## 2015-03-15 NOTE — Progress Notes (Signed)
[address]       [place] [address]             [phone]      [name] 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 SURGICAL PROCEDURE: Coronary artery bypass grafting x4, with left internal mammary to the left anterior descending coronary artery, sequential reverse saphenous vein graft to the first and second intermediate coronary artery, reverse saphenous vein graft to the posterior descending coronary artery, with the left leg greater saphenous thigh and calf endo vein harvesting. SURGEON: Lanelle Bal, M.D.  History of Present Illness:     Patient is making good progress since discharge home, after coronary artery bypass grafting complicated by gram-negative wound incision infection, treated by opening the wound and has had a wound VAC in place. He's had no fever . He has had some night sweats. He's walking up to 2 miles a day but discourage that he does not feel better. He will complete his course of anti-biotic  today and have the PICC line removed . He will start cardiac rehabilitation next week      Past Medical History  Diagnosis Date  . CAD (coronary artery disease)     s/p inferior wall infarct in 10/01. has stent in Rt coronary artery. is due a stress myoview. does have some dyspnea on exertion  . SOB (shortness of breath) on exertion   . Inferior myocardial infarction (Ages) 10/01    stent RCA  . Chest pain   . HLD (hyperlipidemia)     isdue followup lipids. on zocor 10 mg/day   . HTN (hypertension)   . Allergy     seasonal  . Cataract     removed  . Reflux esophagitis   . OSA (obstructive  sleep apnea) 01/13/2015  . Erectile dysfunction   . Arthritis     all over- in general   . Cancer ([place])     skin, melanoma  . GERD (gastroesophageal reflux disease)     pt. denies      History  Smoking status  . Former Smoker -- 2.50 packs/day for 40 years  . Types: Cigarettes  . Quit date: 03/24/2000  Smokeless tobacco  . Never Used    History  Alcohol Use  . 6.0 oz/week  . 10 Shots of liquor per week    Comment: occasional- 2 times per week      No Known Allergies  Current Outpatient Prescriptions  Medication Sig Dispense Refill  . amiodarone (PACERONE) 200 MG tablet Take 1 tablet (200 mg total) by mouth 2 (two) times daily. X 7 days, then decrease to 200 mg BID x 7 days, 200 mg daily 90 tablet 1  . aspirin 81 MG EC tablet Take 81 mg by mouth daily.      . Calcium-Vitamin D (RA CALCIUM PLUS VITAMIN D) 600-125 MG-UNIT TABS Take 1 tablet by mouth 2 (two) times daily.     . cefTRIAXone 2 g in dextrose 5 % 50 mL Inject 2 g into the vein daily. 35 each 0  . metoprolol tartrate (LOPRESSOR) 25 MG tablet Take 0.5 tablets (12.5 mg total) by mouth 2 (two) times daily. (Patient  not taking: Reported on 03/07/2015) 30 tablet 3  . Probiotic Product (PROBIOTIC PO) Take 2 tablets by mouth daily. accuflora    . simvastatin (ZOCOR) 20 MG tablet Take 1 tablet (20 mg total) by mouth at bedtime. 90 tablet 1  . tadalafil (CIALIS) 20 MG tablet Take 1 tablet (20 mg total) by mouth daily as needed for erectile dysfunction. (Patient not taking: Reported on 03/07/2015) 10 tablet 0  . traMADol (ULTRAM) 50 MG tablet Take 1 tablet (50 mg total) by mouth every 6 (six) hours as needed. (Patient taking differently: Take 50 mg by mouth every 6 (six) hours as needed for moderate pain. ) 60 tablet 5  . vitamin B-12 (CYANOCOBALAMIN) 1000 MCG tablet Take 1,000 mcg by mouth daily.    . vitamin C (ASCORBIC ACID) 500 MG tablet Take 500 mg by mouth daily.    . Zinc 100 MG TABS Take 1 tablet by mouth daily.       No  current facility-administered medications for this visit.       Physical Exam: Ht '5\' 8"'$  (1.727 m)  Wt 190 lb (86.183 kg)  BMI 28.90 kg/m2  SpO2   General appearance: alert and cooperative Neurologic: intact Heart: regular rate and rhythm, S1, S2 normal, no murmur, click, rub or gallop Lungs: clear to auscultation bilaterally Abdomen: soft, non-tender; bowel sounds normal; no masses,  no organomegaly Extremities: extremities normal, atraumatic, no cyanosis or edema and Homans sign is negative, no sign of DVT Wound: The patient sternotomy incision is now completely healed, there is no surrounding erythema or evidence of infection, on exam the bone is stable  Diagnostic Studies & Laboratory data:     Recent Radiology Findings:   No results found.    Recent Lab Findings: Lab Results  Component Value Date   WBC 10.6 02/12/2015   HGB 11.1* 02/12/2015   HCT 34.2* 02/12/2015   PLT 418 02/12/2015   GLUCOSE 114* 02/12/2015   CHOL 144 11/24/2014   TRIG 71.0 11/24/2014   HDL 46.90 11/24/2014   LDLDIRECT 78.0 04/05/2013   LDLCALC 83 11/24/2014   ALT 63 02/02/2015   AST 50* 02/02/2015   NA 134* 02/12/2015   K 3.5 02/12/2015   CL 94* 02/12/2015   CREATININE 1.06 02/12/2015   BUN 16 02/12/2015   CO2 29 02/12/2015   TSH 3.318 02/02/2015   INR 1.34 01/24/2015   HGBA1C 5.8* 01/22/2015      Assessment / Plan:    Continues with PICC line and IV antibody therapy outlined by infectious disease until October 6 to be completed tomorrow   his wound is now completely healed sternum is stable  We'll plan to refer to cardiac rehabilitation when he has completed anabolic and his PICC line has been removed I'll see him back in 4 weeks, he is to see cardiology in 2 weeks and discuss possible discontinuing his amiodarone at that point,    [name] MD      [place] [place] [employer],[place] [address] Office [phone]   Beeper [phone]  03/15/2015 12:47  PM

## 2015-03-19 ENCOUNTER — Other Ambulatory Visit: Payer: Self-pay | Admitting: *Deleted

## 2015-03-19 NOTE — Patient Outreach (Signed)
Transition of care call #4. Pt reports he is finally off the IV antibiotic and he feels slightly better but still has a lot of fatigue. He has an appt with his cardiologist next Tuesday and will see the infectious disease MD on Wednesday. He says ever since he has been on the amiodarone he has felt like he has a fever, but he does not. His face just feels flushed and like it is on fire. Amiodarone can cause photo sensitiviy and I told pt he should use sun screen if he is getting out in the sun. He said he isn't too much but he appreciated the information. I will call him again next week to follow up with him after his 2 MD appts.  Deloria Lair [name] [place] [phone]

## 2015-03-22 ENCOUNTER — Encounter: Payer: Self-pay | Admitting: Internal Medicine

## 2015-03-22 ENCOUNTER — Ambulatory Visit (INDEPENDENT_AMBULATORY_CARE_PROVIDER_SITE_OTHER): Payer: PPO | Admitting: Internal Medicine

## 2015-03-22 VITALS — BP 136/78 | HR 62 | Temp 98.2°F | Ht 68.0 in | Wt 197.0 lb

## 2015-03-22 DIAGNOSIS — A498 Other bacterial infections of unspecified site: Secondary | ICD-10-CM

## 2015-03-22 DIAGNOSIS — Z23 Encounter for immunization: Secondary | ICD-10-CM | POA: Diagnosis not present

## 2015-03-22 LAB — C-REACTIVE PROTEIN: CRP: <0.5 mg/dL (ref <0.60)

## 2015-03-22 NOTE — Assessment & Plan Note (Signed)
His Enterobacter bacteremia and lower sternal wound infection was caught very early during his recent hospitalization. He has responded well and appears of had complete healing of the wound. I'm hopeful that his infection has been cured. I will continue observation off of antibiotics and repeat his inflammatory markers today. I will see him back in 6 weeks.

## 2015-03-22 NOTE — Progress Notes (Signed)
Patient ID: Randall Ali., male   DOB: [birth year], 74 y.o.   MRN: [id number]         [facility] for Infectious Disease  Patient Active Problem List   Diagnosis Date Noted  . Infection due to Enterobacter aerogenes 02/02/2015    Priority: High  . Postoperative wound infection 02/02/2015    Priority: High  . Status post right foot surgery 02/02/2015    Priority: Medium  . 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    Priority: Medium  . Acute respiratory failure with hypoxia ([place]) 01/29/2015  . Delirium of mixed origin 01/29/2015  . History of coronary artery stent placement   . OSA (obstructive sleep apnea) 01/13/2015  . Coronary artery disease involving native coronary artery of native heart with angina pectoris with documented spasm ([place])   . Hyperlipidemia   . Essential hypertension   . Chest pain 01/05/2015  . H/O surgical procedure 01/01/2015  . Hepatic steatosis 11/24/2014  . Unexplained night sweats 11/02/2014  . Angina pectoris ([place]) 08/11/2014  . PAD (peripheral artery disease) ([place]) 04/11/2014  . Chronic daily headache 04/11/2014  . Chronic fatigue and malaise 01/11/2014  . Inflammation of toe 05/19/2013  . H/O arthroplasty 05/19/2013  . Screening for prostate cancer 02/27/2012  . Medicare annual wellness visit, subsequent 02/27/2012  . Erectile dysfunction 02/27/2012  . HLD (hyperlipidemia)   . HTN (hypertension)   . Dysphagia 12/24/2011  . Pulmonary nodule 12/24/2011  . HYPERLIPIDEMIA-MIXED 03/12/2010  . OLD MYOCARDIAL INFARCTION 03/08/2009  . CAD, NATIVE VESSEL 03/08/2009    Patient's Medications  New Prescriptions   No medications on file  Previous Medications   AMIODARONE (PACERONE) 200 MG TABLET    Take 1 tablet (200 mg total) by mouth 2 (two) times daily. X 7 days, then decrease to 200 mg BID x 7 days, 200 mg daily   ASPIRIN 81 MG EC TABLET    Take 81 mg by mouth daily.     CALCIUM-VITAMIN D (RA CALCIUM PLUS VITAMIN D) 600-125 MG-UNIT TABS    Take 1  tablet by mouth 2 (two) times daily.    METOPROLOL TARTRATE (LOPRESSOR) 25 MG TABLET    Take 0.5 tablets (12.5 mg total) by mouth 2 (two) times daily.   PROBIOTIC PRODUCT (PROBIOTIC PO)    Take 2 tablets by mouth daily. accuflora   SIMVASTATIN (ZOCOR) 20 MG TABLET    Take 1 tablet (20 mg total) by mouth at bedtime.   TRAMADOL (ULTRAM) 50 MG TABLET    Take 1 tablet (50 mg total) by mouth every 6 (six) hours as needed.   VITAMIN B-12 (CYANOCOBALAMIN) 1000 MCG TABLET    Take 1,000 mcg by mouth daily.   VITAMIN C (ASCORBIC ACID) 500 MG TABLET    Take 500 mg by mouth daily.   ZINC 100 MG TABS    Take 1 tablet by mouth daily.    Modified Medications   No medications on file  Discontinued Medications   CEFTRIAXONE 2 G IN DEXTROSE 5 % 50 ML    Inject 2 g into the vein daily.    Subjective: Royce Stegman. is a 74 y.o. male underwent elective coronary artery bypass grafting on 01/24/2015. He developed an episode of postoperative atrial fibrillation. He developed early postoperative delirium and began having fevers on 01/29/2015. He then began to have some drainage from the inferior portion of his sternal wound. Blood cultures and wound cultures grew Enterobacter aerogenes. He was discharged on IV ceftriaxone  through a right arm PICC and completed 6 weeks of therapy about one week ago. He had no problem tolerating his ceftriaxone or PICC. He is feeling much better.  He underwent right foot surgery in May. He underwent repeat hardware fusion of his right great toe and had hammertoe repair of his right second toe. He developed a postoperative infection with persistent drainage from the second toe. A culture done at [facility] in late July grew MSSA. He had a brief course of doxycycline. The drainage stopped but he has continued to have redness and swelling of the second toe and sensation of swelling on the ball of his right foot. He was still having pain, redness and swelling of his  right foot when he entered the hospital for his heart surgery. The swelling and redness improved promptly after he started on antibiotics. The wound on the tip of his right second toe finally healed. He still has some pain when walking but no more redness or swelling.  Review of Systems: Pertinent items are noted in HPI.  Past Medical History  Diagnosis Date  . CAD (coronary artery disease)     s/p inferior wall infarct in 10/01. has stent in Rt coronary artery. is due a stress myoview. does have some dyspnea on exertion  . SOB (shortness of breath) on exertion   . Inferior myocardial infarction ([place]) 10/01    stent RCA  . Chest pain   . HLD (hyperlipidemia)     isdue followup lipids. on zocor 10 mg/day   . HTN (hypertension)   . Allergy     seasonal  . Cataract     removed  . Reflux esophagitis   . OSA (obstructive sleep apnea) 01/13/2015  . Erectile dysfunction   . Arthritis     all over- in general   . Cancer ([place])     skin, melanoma  . GERD (gastroesophageal reflux disease)     pt. denies     Social History  Substance Use Topics  . Smoking status: Former Smoker -- 2.50 packs/day for 40 years    Types: Cigarettes    Quit date: 03/24/2000  . Smokeless tobacco: Never Used  . Alcohol Use: 6.0 oz/week    10 Shots of liquor per week     Comment: occasional- 2 times per week     Family History  Problem Relation Age of Onset  . Hypertension Mother   . Heart disease Mother   . Hypertension Father   . Diabetes Father   . Cancer Paternal Grandfather   . Heart disease Brother 5  . Colon cancer Neg Hx     No Known Allergies  Objective: Filed Vitals:   03/22/15 1404  BP: 136/78  Pulse: 62  Temp: 98.2 F (36.8 C)  TempSrc: Oral  Height: '5\' 8"'$  (1.727 m)  Weight: 197 lb (89.359 kg)   Body mass index is 29.96 kg/(m^2).  General: He is smiling and in good spirits Skin: No rash Lungs: Clear Cor: Regular S1 and S2 with no murmur Chest: Sternal incision is healed  nicely without any evidence of active infection Right foot: Healed incisions over first and second toes and metatarsals. Complete healing of wound at the tip of the right second toe. No unusual redness, swelling or warmth.  Lab Results SED RATE (mm/hr)  Date Value  02/12/2015 79*  11/24/2014 10  01/11/2014 11   CRP (mg/dL)  Date Value  01/11/2014 0.8  05/17/2013 2.5  Problem List Items Addressed This Visit      High   Infection due to Enterobacter aerogenes    His Enterobacter bacteremia and lower sternal wound infection was caught very early during his recent hospitalization. He has responded well and appears of had complete healing of the wound. I'm hopeful that his infection has been cured. I will continue observation off of antibiotics and repeat his inflammatory markers today. I will see him back in 6 weeks.      Relevant Orders   C-reactive protein   Sedimentation rate       Michel Bickers, MD [facility] for Infectious [place] [phone] pager   [phone] cell 03/22/2015, 2:40 PM

## 2015-03-23 LAB — SEDIMENTATION RATE: Sed Rate: 9 mm/hr (ref 0–20)

## 2015-03-27 ENCOUNTER — Ambulatory Visit (INDEPENDENT_AMBULATORY_CARE_PROVIDER_SITE_OTHER): Payer: PPO | Admitting: Cardiovascular Disease

## 2015-03-27 ENCOUNTER — Encounter: Payer: Self-pay | Admitting: Cardiovascular Disease

## 2015-03-27 VITALS — BP 158/86 | HR 61 | Ht 68.0 in | Wt 195.8 lb

## 2015-03-27 DIAGNOSIS — I25111 Atherosclerotic heart disease of native coronary artery with angina pectoris with documented spasm: Secondary | ICD-10-CM | POA: Diagnosis not present

## 2015-03-27 DIAGNOSIS — Z951 Presence of aortocoronary bypass graft: Secondary | ICD-10-CM

## 2015-03-27 DIAGNOSIS — I10 Essential (primary) hypertension: Secondary | ICD-10-CM | POA: Diagnosis not present

## 2015-03-27 DIAGNOSIS — I209 Angina pectoris, unspecified: Secondary | ICD-10-CM

## 2015-03-27 DIAGNOSIS — I480 Paroxysmal atrial fibrillation: Secondary | ICD-10-CM

## 2015-03-27 DIAGNOSIS — A498 Other bacterial infections of unspecified site: Secondary | ICD-10-CM

## 2015-03-27 DIAGNOSIS — I2510 Atherosclerotic heart disease of native coronary artery without angina pectoris: Secondary | ICD-10-CM

## 2015-03-27 DIAGNOSIS — E785 Hyperlipidemia, unspecified: Secondary | ICD-10-CM

## 2015-03-27 DIAGNOSIS — I4891 Unspecified atrial fibrillation: Secondary | ICD-10-CM | POA: Insufficient documentation

## 2015-03-27 MED ORDER — SIMVASTATIN 40 MG PO TABS: 40.0000 mg | ORAL_TABLET | Freq: Every day | ORAL | Status: DC

## 2015-03-27 MED ORDER — METOPROLOL SUCCINATE ER 25 MG PO TB24: 25.0000 mg | ORAL_TABLET | Freq: Every day | ORAL | Status: DC

## 2015-03-27 MED ORDER — LISINOPRIL 10 MG PO TABS: 10.0000 mg | ORAL_TABLET | Freq: Every day | ORAL | Status: DC

## 2015-03-27 NOTE — Assessment & Plan Note (Signed)
Currently without symptoms of angina. Reports he is active in no complaints

## 2015-03-27 NOTE — Assessment & Plan Note (Signed)
Maintaining normal sinus rhythm. We will hold his amiodarone

## 2015-03-27 NOTE — Progress Notes (Signed)
Patient ID: Randall Gladden., male    DOB: 14-Jun-1940, 74 y.o.   MRN: [id number]  HPI Comments: Randall Ali is a very pleasant 74 year old gentleman with a history of coronary artery disease,history of an inferior wall MI 2001 with stent to the RCA,   hypertension, and hyperlipidemia. Previous admit in 2011 for for mild chest pain and sweating. Stress test showed no ischemia with inferior scar. Previous cardiac catheterization showing moderate OM disease.  Repeat catheterization 2 months ago in August 2016 showing severe three-vessel disease, sent for CABG on 01/24/2015.    He hadpostoperative atrial fibrillation,  postoperative delirium and began having fevers on 01/29/2015.  some drainage from the inferior portion of his sternal wound. Blood cultures and wound cultures grew Enterobacter aerogenes. He was discharged on IV ceftriaxone  and completed 6 weeks of therapy . He took probiotics during this time  In follow-up, he reports that he is doing well. He is walking 2 blocks per day He does not check his blood pressure at home. Recent cholesterol shows LDL in the 80s Denies any shortness of breath on exertion He does have chest pain when he coughs  EKG on today's visit shows normal sinus rhythm with rate 61 bpm, no significant ST or T-wave changes  Other past medical history  t right foot surgery in May 2016. repeat hardware fusion of his right great toe and had hammertoe repair of his right second toe.   postoperative infection with persistent drainage from the second toe. A culture in late July grew MSSA. course of doxycycline.  The drainage stopped but he has continued to have redness and swelling  He still has some pain when walking   Other past medical history   In 2013, he was having worsening chest pain medically with exertion.  cardiac catheterization that showed moderate obtuse marginal disease, otherwise no significant stenosis. This was discussed with the interventional  physicians in [facility] and recommendation was made for medical management.   previous Total cholesterol 140, LDL 78 prior EGD  that showed an ulcer. Previously had trouble swallowing.    No Known Allergies  Outpatient Encounter Prescriptions as of 03/27/2015  Medication Sig  . aspirin 81 MG EC tablet Take 81 mg by mouth daily.    . Calcium-Vitamin D (RA CALCIUM PLUS VITAMIN D) 600-125 MG-UNIT TABS Take 1 tablet by mouth 2 (two) times daily.   . Probiotic Product (PROBIOTIC PO) Take 2 tablets by mouth daily. accuflora  . simvastatin (ZOCOR) 40 MG tablet Take 1 tablet (40 mg total) by mouth at bedtime.  . traMADol (ULTRAM) 50 MG tablet Take 1 tablet (50 mg total) by mouth every 6 (six) hours as needed. (Patient taking differently: Take 50 mg by mouth every 6 (six) hours as needed for moderate pain. )  . vitamin B-12 (CYANOCOBALAMIN) 1000 MCG tablet Take 1,000 mcg by mouth daily.  . vitamin C (ASCORBIC ACID) 500 MG tablet Take 500 mg by mouth daily.  . Zinc 100 MG TABS Take 1 tablet by mouth daily.    . [DISCONTINUED] amiodarone (PACERONE) 200 MG tablet Take 1 tablet (200 mg total) by mouth 2 (two) times daily. X 7 days, then decrease to 200 mg BID x 7 days, 200 mg daily (Patient taking differently: Take 200 mg by mouth daily. )  . [DISCONTINUED] metoprolol tartrate (LOPRESSOR) 25 MG tablet Take 0.5 tablets (12.5 mg total) by mouth 2 (two) times daily.  . [DISCONTINUED] simvastatin (ZOCOR) 20 MG tablet Take 1  tablet (20 mg total) by mouth at bedtime.  [name] lisinopril (PRINIVIL,ZESTRIL) 10 MG tablet Take 1 tablet (10 mg total) by mouth daily.  . metoprolol succinate (TOPROL XL) 25 MG 24 hr tablet Take 1 tablet (25 mg total) by mouth daily.   No facility-administered encounter medications on file as of 03/27/2015.    Past Medical History  Diagnosis Date  . CAD (coronary artery disease)     s/p inferior wall infarct in 10/01. has stent in Rt coronary artery. is due a stress myoview. does have  some dyspnea on exertion  . SOB (shortness of breath) on exertion   . Inferior myocardial infarction ([place]) 10/01    stent RCA  . Chest pain   . HLD (hyperlipidemia)     isdue followup lipids. on zocor 10 mg/day   . HTN (hypertension)   . Allergy     seasonal  . Cataract     removed  . Reflux esophagitis   . OSA (obstructive sleep apnea) 01/13/2015  . Erectile dysfunction   . Arthritis     all over- in general   . Cancer ([place])     skin, melanoma  . GERD (gastroesophageal reflux disease)     pt. denies     Past Surgical History  Procedure Laterality Date  . Cardiac catheterization  06/24/11  . Carotid stent  03/10/2011  . Arm surgery  2010  . Shoulder arthroscopy  2012  . Wrist surgery  2011  . Toe surgery  1994  . Nasal sinus surgery  2008    septpolasty, bilateral turbinate reduction  . Hammer toe surgery      right toe  . Colonoscopy  2010  . Cardiac catheterization N/A 01/18/2015    Procedure: Left Heart Cath with coronary angiography;  Surgeon: Minna Merritts, MD;  Location: [facility];  Service: Cardiovascular;  Laterality: N/A;  . Cardiac catheterization N/A 01/18/2015    Procedure: Intravascular Pressure Wire/FFR Study;  Surgeon: [name], MD;  Location: [facility];  Service: Cardiovascular;  Laterality: N/A;  . Eye surgery      lasik 15 yrs. ago, cataracts removed - both eyes   . Coronary artery bypass graft N/A 01/24/2015    Procedure: CORONARY ARTERY BYPASS GRAFTING x 4 (LIMA-LAD, SVG-Int 1- Int 2, SVG-PD) ENDOSCOPIC GREATER SAPHENOUS VEIN HARVEST LEFT LEG;  Surgeon: [name], MD;  Location: [facility];  Service: Open Heart Surgery;  Laterality: N/A;  . Tee without cardioversion N/A 01/24/2015    Procedure: TRANSESOPHAGEAL ECHOCARDIOGRAM (TEE);  Surgeon: [name], MD;  Location: [place];  Service: Open Heart Surgery;  Laterality: N/A;    Social History  reports that he quit smoking about 15 years ago. His smoking use included  Cigarettes. He has a 100 pack-year smoking history. He has never used smokeless tobacco. He reports that he does not drink alcohol or use illicit drugs.  Family History family history includes Cancer in his paternal grandfather; Diabetes in his father; Heart disease in his mother; Heart disease (age of onset: 7) in his brother; Hypertension in his father and mother. There is no history of Colon cancer. Review of Systems  Constitutional: Negative.   Respiratory: Negative.   Cardiovascular: Negative.   Gastrointestinal: Negative.   Musculoskeletal: Negative.   Skin: Negative.   Neurological: Negative.   Hematological: Negative.   Psychiatric/Behavioral: Negative.   All other systems reviewed and are negative.  BP 158/86 mmHg  Pulse 61  Ht  $'5\' 8"'R$  (1.727 m)  Wt 195 lb 12 oz (88.792 kg)  BMI 29.77 kg/m2  Physical Exam  Constitutional: He is oriented to person, place, and time. He appears well-developed and well-nourished.  HENT:  Head: Normocephalic.  Nose: Nose normal.  Mouth/Throat: Oropharynx is clear and moist.  Eyes: Conjunctivae are normal. Pupils are equal, round, and reactive to light.  Neck: Normal range of motion. Neck supple. No JVD present.  Cardiovascular: Normal rate, regular rhythm, S1 normal, S2 normal, normal heart sounds and intact distal pulses.  Exam reveals no gallop and no friction rub.   No murmur heard. Pulmonary/Chest: Effort normal and breath sounds normal. No respiratory distress. He has no wheezes. He has no rales. He exhibits no tenderness.  Abdominal: Soft. Bowel sounds are normal. He exhibits no distension. There is no tenderness.  Musculoskeletal: Normal range of motion. He exhibits no edema or tenderness.  Lymphadenopathy:    He has no cervical adenopathy.  Neurological: He is alert and oriented to person, place, and time. Coordination normal.  Skin: Skin is warm and dry. No rash noted. No erythema.  Psychiatric: He has a normal mood and affect. His  behavior is normal. Judgment and thought content normal.      Assessment and Plan   Nursing note and vitals reviewed.

## 2015-03-27 NOTE — Patient Instructions (Addendum)
You are doing well.  Ok to go back to work  Please start lisinopril one a day (call if you get a cough)  Please hold amiodarone  Please increase the simvastatin up to 40 mg daily  Please call us if you have new issues that need to be addressed before your next appt.  Your physician wants you to follow-up in: 6 months.  You will receive a reminder letter in the mail two months in advance. If you don't receive a letter, please call our office to schedule the follow-up appointment.

## 2015-03-27 NOTE — Assessment & Plan Note (Signed)
He prefers to take change back to Toprol. A prescription has been sent in We will also add lisinopril 10 mg daily

## 2015-03-27 NOTE — Assessment & Plan Note (Signed)
Currently with no symptoms of angina. No further workup at this time. Continue current medication regimen. 

## 2015-03-27 NOTE — Assessment & Plan Note (Signed)
Complications following the procedure including postoperative atrial fibrillation, postoperative infection On today's visit, reports he is doing well. Most of these issues have resolved

## 2015-03-27 NOTE — Assessment & Plan Note (Signed)
We have suggested he increase his simvastatin up to 40 mg daily to achieve LDL less than 70

## 2015-03-27 NOTE — Assessment & Plan Note (Signed)
Managed by infectious disease. He reports symptoms have resolved 6 weeks of antibiotic

## 2015-03-28 ENCOUNTER — Encounter: Payer: PPO | Attending: Cardiothoracic Surgery | Admitting: *Deleted

## 2015-03-28 ENCOUNTER — Encounter: Payer: Self-pay | Admitting: *Deleted

## 2015-03-28 VITALS — Ht 68.5 in | Wt 194.4 lb

## 2015-03-28 DIAGNOSIS — Z951 Presence of aortocoronary bypass graft: Secondary | ICD-10-CM | POA: Diagnosis not present

## 2015-03-28 NOTE — Patient Instructions (Addendum)
Patient Instructions  Patient Details  Name: Randall Ali. MRN: [id number] Date of Birth: 1941/01/05 Referring Provider:  [name], MD  Below are the personal goals you chose as well as exercise and nutrition goals. Our goal is to help you keep on track towards obtaining and maintaining your goals. We will be discussing your progress on these goals with you throughout the program.  Initial Exercise Prescription:     Initial Exercise Prescription - 03/28/15 1400    Date of Initial Exercise Prescription   Date 03/28/15   Treadmill   MPH 2.3   Grade 0   Minutes 15   Bike   Level 0.4   Minutes 10   Recumbant Bike   Level 3   RPM 45   Watts 25   Minutes 15   NuStep   Level 3   Watts 30   Minutes 15   Arm Ergometer   Level 1   Watts 8   Minutes 10   Arm/Foot Ergometer   Level 4   Watts 12   Minutes 10   Cybex   Level 3   RPM 50   Minutes 15   Recumbant Elliptical   Level 2   RPM 40   Watts 20   Minutes 15   Elliptical   Level 1   Speed 3   Minutes 1   REL-XR   Level 2   Watts 30   Minutes 15   Prescription Details   Frequency (times per week) 3   Duration Progress to 30 minutes of continuous aerobic without signs/symptoms of physical distress   Intensity   THRR REST +  30   Ratings of Perceived Exertion 11-15   Progression Continue progressive overload as per policy without signs/symptoms or physical distress.   Resistance Training   Training Prescription No  One month left on a 3 month weight restriction      Exercise Goals: Frequency: Be able to perform aerobic exercise three times per week working toward 3-5 days per week.  Intensity: Work with a perceived exertion of 11 (fairly light) - 15 (hard) as tolerated. Follow your new exercise prescription and watch for changes in prescription as you progress with the program. Changes will be reviewed with you when they are made.  Duration: You should be able to do 30 minutes of  continuous aerobic exercise in addition to a 5 minute warm-up and a 5 minute cool-down routine.  Nutrition Goals: Your personal nutrition goals will be established when you do your nutrition analysis with the dietician.  The following are nutrition guidelines to follow: Cholesterol < '200mg'$ /day Sodium < '1500mg'$ /day Fiber: Men over 50 yrs - 30 grams per day  Personal Goals:     Personal Goals and Risk Factors at Admission - 03/28/15 1515    Personal Goals and Risk Factors on Admission   Increase Aerobic Exercise and Physical Activity Yes   Intervention While in program, learn and follow the exercise prescription taught. Start at a low level workload and increase workload after able to maintain previous level for 30 minutes. Increase time before increasing intensity.   Diabetes No   Hypertension Yes   Goal Participant will see blood pressure controlled within the values of 140/28m/Hg or within value directed by their physician.   Intervention Provide nutrition & aerobic exercise along with prescribed medications to achieve BP 140/90 or less.   Lipids Yes   Goal Cholesterol controlled with medications as prescribed, with individualized  exercise RX and with personalized nutrition plan. Value goals: LDL < '70mg'$ , HDL > '40mg'$ . Participant states understanding of desired cholesterol values and following prescriptions.   Intervention Provide nutrition & aerobic exercise along with prescribed medications to achieve LDL '70mg'$ , HDL >'40mg'$ .   Stress No      Tobacco Use Initial Evaluation: History  Smoking status  . Former Smoker -- 2.50 packs/day for 40 years  . Types: Cigarettes  . Quit date: 03/24/2000  Smokeless tobacco  . Never Used    Patient had CABG surgery, special considerations will need to be made during his exercise and during the weights and stretching portion of class.  Per ACSM Guidelines: Sternal bone healing to attain adequate sternal stability is commonly achieved by 8wks  which would be June 29th, 2016. Clinical judgement should be used in exercise programming through 12 weeks post surgery (July 27th).  Upper body movements that exert tension on the sternal wound should be avoided. No standard weight limits apply.  ROM exercises and other activities that involve the sternal muscles can be gradually introduced and progressed as long as there is no evidence of sternal instability as detected by movement in the sternum, pain, cracking or popping.   Aerobic exercise: Walking is highly recommended  RPE 11-16 Intensity kept below the ischemic threshold  Resistance exercise: Per patient's MD he has one month left on a 73-monthweight lifting restriction. Patient will bring clearance from MD once he is approved to lift weights as part of cardiac rehab program.   Copy of goals given to participant.

## 2015-03-28 NOTE — Progress Notes (Signed)
Cardiac Individual Treatment Plan  Patient Details  Name: Randall Ali. MRN: [id number] Date of Birth: Aug 03, 1940 Referring Provider:  [name], MD  Initial Encounter Date: Date: 03/28/15  Visit Diagnosis: S/P CABG x 4  Patient's Home Medications on Admission:  Current outpatient prescriptions:  .  aspirin 81 MG EC tablet, Take 81 mg by mouth daily.  , Disp: , Rfl:  .  Calcium-Vitamin D (RA CALCIUM PLUS VITAMIN D) 600-125 MG-UNIT TABS, Take 1 tablet by mouth 2 (two) times daily. , Disp: , Rfl:  .  lisinopril (PRINIVIL,ZESTRIL) 10 MG tablet, Take 1 tablet (10 mg total) by mouth daily., Disp: 90 tablet, Rfl: 3 .  metoprolol succinate (TOPROL XL) 25 MG 24 hr tablet, Take 1 tablet (25 mg total) by mouth daily., Disp: 90 tablet, Rfl: 4 .  Probiotic Product (PROBIOTIC PO), Take 2 tablets by mouth daily. accuflora, Disp: , Rfl:  .  simvastatin (ZOCOR) 40 MG tablet, Take 1 tablet (40 mg total) by mouth at bedtime., Disp: 90 tablet, Rfl: 3 .  traMADol (ULTRAM) 50 MG tablet, Take 1 tablet (50 mg total) by mouth every 6 (six) hours as needed. (Patient taking differently: Take 50 mg by mouth every 6 (six) hours as needed for moderate pain. ), Disp: 60 tablet, Rfl: 5 .  vitamin B-12 (CYANOCOBALAMIN) 1000 MCG tablet, Take 1,000 mcg by mouth daily., Disp: , Rfl:  .  vitamin C (ASCORBIC ACID) 500 MG tablet, Take 500 mg by mouth daily., Disp: , Rfl:  .  Zinc 100 MG TABS, Take 1 tablet by mouth daily.  , Disp: , Rfl:   Past Medical History: Past Medical History  Diagnosis Date  . CAD (coronary artery disease)     s/p inferior wall infarct in 10/01. has stent in Rt coronary artery. is due a stress myoview. does have some dyspnea on exertion  . SOB (shortness of breath) on exertion   . 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 Chest pain   . HLD (hyperlipidemia)     isdue followup lipids. on zocor 10 mg/day   . HTN (hypertension)   . Allergy     seasonal  .  Cataract     removed  . Reflux esophagitis   . OSA (obstructive sleep apnea) 01/13/2015  . Erectile dysfunction   . Arthritis     all over- in general   . Cancer ([place])     skin, melanoma  . GERD (gastroesophageal reflux disease)     pt. denies     Tobacco Use: History  Smoking status  . Former Smoker -- 2.50 packs/day for 40 years  . Types: Cigarettes  . Quit date: 03/24/2000  Smokeless tobacco  . Never Used    Labs: Recent Review Flowsheet Data    Labs for ITP Cardiac and Pulmonary Rehab Latest Ref Rng 01/28/2015 01/28/2015 01/29/2015 01/30/2015 01/30/2015   PHART 7.350 - 7.450 7.473(H) - 7.518(H) 7.508(H) 7.513(H)   PCO2ART 35.0 - 45.0 mmHg 36.4 - 34.7(L) 38.4 37.8   HCO3 20.0 - 24.0 mEq/L 26.6(H) - 28.3(H) 30.5(H) 30.2(H)   TCO2 0 - 100 mmol/L '28 25 29 '$ 32 31.4   O2SAT - 92.0 - 94.0 95.0 99.1       Exercise Target Goals: Date: 03/28/15  Exercise Program Goal: Individual exercise prescription set with THRR, safety & activity barriers. Participant demonstrates ability to understand and report RPE using BORG scale, to self-measure pulse accurately, and to acknowledge the importance  of the exercise prescription.  Exercise Prescription Goal: Starting with aerobic activity 30 plus minutes a day, 3 days per week for initial exercise prescription. Provide home exercise prescription and guidelines that participant acknowledges understanding prior to discharge.  Activity Barriers & Risk Stratification:     Activity Barriers & Risk Stratification - 03/28/15 1506    Activity Barriers & Risk Stratification   Activity Barriers Other (comment);Arthritis;Joint Problems  Pt has had bilateral shoulder, elbow, and wrist surgery.  Pt has had surgery on right foot x 3 over the past three years.  Patient wanting to get back to weight lifting and playing softball.  Pt does c/o of pain in right foot at times with walking.     Risk Stratification High      6 Minute Walk:     6 Minute Walk       03/28/15 1426       6 Minute Walk   Phase Initial     Distance 1500 feet     Walk Time 6 minutes     Resting HR 73 bpm     Resting BP 142/80 mmHg     Max Ex. HR 105 bpm     Max Ex. BP 164/72 mmHg     RPE 11     Symptoms No        Initial Exercise Prescription:     Initial Exercise Prescription - 03/28/15 1400    Date of Initial Exercise Prescription   Date 03/28/15   Treadmill   MPH 2.3   Grade 0   Minutes 15   Bike   Level 0.4   Minutes 10   Recumbant Bike   Level 3   RPM 45   Watts 25   Minutes 15   NuStep   Level 3   Watts 30   Minutes 15   Arm Ergometer   Level 1   Watts 8   Minutes 10   Arm/Foot Ergometer   Level 4   Watts 12   Minutes 10   Cybex   Level 3   RPM 50   Minutes 15   Recumbant Elliptical   Level 2   RPM 40   Watts 20   Minutes 15   Elliptical   Level 1   Speed 3   Minutes 1   REL-XR   Level 2   Watts 30   Minutes 15   Prescription Details   Frequency (times per week) 3   Duration Progress to 30 minutes of continuous aerobic without signs/symptoms of physical distress   Intensity   THRR REST +  30   Ratings of Perceived Exertion 11-15   Progression Continue progressive overload as per policy without signs/symptoms or physical distress.   Resistance Training   Training Prescription No  One month left on a 3 month weight restriction      Exercise Prescription Changes:   Discharge Exercise Prescription (Final Exercise Prescription Changes):   Nutrition:  Target Goals: Understanding of nutrition guidelines, daily intake of sodium '1500mg'$ , cholesterol '200mg'$ , calories 30% from fat and 7% or less from saturated fats, daily to have 5 or more servings of fruits and vegetables.  Biometrics:     Pre Biometrics - 03/28/15 1423    Pre Biometrics   Height 5' 8.5" (1.74 m)   Weight 194 lb 6.4 oz (88.179 kg)   Waist Circumference 40.5 inches   Hip Circumference 37.75 inches   Waist to Hip Ratio 1.07 %  BMI  (Calculated) 29.2       Nutrition Therapy Plan and Nutrition Goals:   Nutrition Discharge: Rate Your Plate Scores:   Nutrition Goals Re-Evaluation:   Psychosocial: Target Goals: Acknowledge presence or absence of depression, maximize coping skills, provide positive support system. Participant is able to verbalize types and ability to use techniques and skills needed for reducing stress and depression.  Initial Review & Psychosocial Screening:     Initial Psych Review & Screening - 03/28/15 1516    Initial Review   Current issues with --  Patient is just ready to exericise.  Pt. denies depression and states he does not feel stressed.     Family Dynamics   Good Support System? Yes  Patietn is divorced.  Has 3 children and 4 grandchildren.  He attends [employer].  Emergency Contact - [name] Winstead Daughter [phone].     Barriers   Psychosocial barriers to participate in program There are no identifiable barriers or psychosocial needs.   Screening Interventions   Interventions Encouraged to exercise      Quality of Life Scores:     Quality of Life - 03/28/15 1519    Quality of Life Scores   Health/Function Pre 28.8 %   Socioeconomic Pre 30 %   Psych/Spiritual Pre 30 %   Family Pre 30 %   GLOBAL Pre 29.45 %      PHQ-9:     Recent Review Flowsheet Data    Depression screen [facility] 2/9 03/28/2015 03/22/2015 03/12/2015 11/24/2014 11/02/2014   Decreased Interest 1 0 1 0 0   Down, Depressed, Hopeless 0 0 0 0 0   PHQ - 2 Score 1 0 1 0 0   Altered sleeping 3 - - - -   Tired, decreased energy 1 - - - -   Change in appetite 0 - - - -   Feeling bad or failure about yourself  0 - - - -   Trouble concentrating 0 - - - -   Moving slowly or fidgety/restless 0 - - - -   Suicidal thoughts 0 - - - -   PHQ-9 Score 5 - - - -   Difficult doing work/chores Not difficult at all - - - -      Psychosocial Evaluation and Intervention:   Psychosocial  Re-Evaluation:   Vocational Rehabilitation: Provide vocational rehab assistance to qualifying candidates.   Vocational Rehab Evaluation & Intervention:     Vocational Rehab - 03/28/15 1513    Initial Vocational Rehab Evaluation & Intervention   Assessment shows need for Vocational Rehabilitation No      Education: Education Goals: Education classes will be provided on a weekly basis, covering required topics. Participant will state understanding/return demonstration of topics presented.  Learning Barriers/Preferences:     Learning Barriers/Preferences - 03/28/15 1510    Learning Barriers/Preferences   Learning Barriers None   Learning Preferences Verbal Instruction      Education Topics: General Nutrition Guidelines/Fats and Fiber: -Group instruction provided by verbal, written material, models and posters to present the general guidelines for heart healthy nutrition. Gives an explanation and review of dietary fats and fiber.   Controlling Sodium/Reading Food Labels: -Group verbal and written material supporting the discussion of sodium use in heart healthy nutrition. Review and explanation with models, verbal and written materials for utilization of the food label.   Exercise Physiology & Risk Factors: - Group verbal and written instruction with models to review the exercise physiology of  the cardiovascular system and associated critical values. Details cardiovascular disease risk factors and the goals associated with each risk factor.   Aerobic Exercise & Resistance Training: - Gives group verbal and written discussion on the health impact of inactivity. On the components of aerobic and resistive training programs and the benefits of this training and how to safely progress through these programs.   Flexibility, Balance, General Exercise Guidelines: - Provides group verbal and written instruction on the benefits of flexibility and balance training programs. Provides  general exercise guidelines with specific guidelines to those with heart or lung disease. Demonstration and skill practice provided.   Stress Management: - Provides group verbal and written instruction about the health risks of elevated stress, cause of high stress, and healthy ways to reduce stress.   Depression: - Provides group verbal and written instruction on the correlation between heart/lung disease and depressed mood, treatment options, and the stigmas associated with seeking treatment.   Anatomy & Physiology of the Heart: - Group verbal and written instruction and models provide basic cardiac anatomy and physiology, with the coronary electrical and arterial systems. Review of: AMI, Angina, Valve disease, Heart Failure, Cardiac Arrhythmia, Pacemakers, and the ICD.   Cardiac Procedures: - Group verbal and written instruction and models to describe the testing methods done to diagnose heart disease. Reviews the outcomes of the test results. Describes the treatment choices: Medical Management, Angioplasty, or Coronary Bypass Surgery.   Cardiac Medications: - Group verbal and written instruction to review commonly prescribed medications for heart disease. Reviews the medication, class of the drug, and side effects. Includes the steps to properly store meds and maintain the prescription regimen.   Go Sex-Intimacy & Heart Disease, Get SMART - Goal Setting: - Group verbal and written instruction through game format to discuss heart disease and the return to sexual intimacy. Provides group verbal and written material to discuss and apply goal setting through the application of the S.M.A.R.T. Method.   Other Matters of the Heart: - Provides group verbal, written materials and models to describe Heart Failure, Angina, Valve Disease, and Diabetes in the realm of heart disease. Includes description of the disease process and treatment options available to the cardiac patient.   Exercise &  Equipment Safety: - Individual verbal instruction and demonstration of equipment use and safety with use of the equipment.          Cardiac Rehab from 03/28/2015 in [facility] Cardiac Rehab   Date  03/28/15   Educator  D.Joya Gaskins, RN   Instruction Review Code  1- partially meets, needs review/practice      Infection Prevention: - Provides verbal and written material to individual with discussion of infection control including proper hand washing and proper equipment cleaning during exercise session.      Cardiac Rehab from 03/28/2015 in [facility] Cardiac Rehab   Date  03/28/15   Educator  D. Joya Gaskins, RN   Instruction Review Code  2- meets goals/outcomes      Falls Prevention: - Provides verbal and written material to individual with discussion of falls prevention and safety.      Cardiac Rehab from 03/28/2015 in [facility] Cardiac Rehab   Date  03/28/15   Educator  D. Joya Gaskins, RN   Instruction Review Code  2- meets goals/outcomes      Diabetes: - Individual verbal and written instruction to review signs/symptoms of diabetes, desired ranges of glucose level fasting, after meals and with exercise. Advice that pre and post exercise glucose checks will be  done for 3 sessions at entry of program.    Knowledge Questionnaire Score:     Knowledge Questionnaire Score - 03/28/15 1510    Knowledge Questionnaire Score   Pre Score 19/28      Personal Goals and Risk Factors at Admission:     Personal Goals and Risk Factors at Admission - 03/28/15 1515    Personal Goals and Risk Factors on Admission   Increase Aerobic Exercise and Physical Activity Yes   Intervention While in program, learn and follow the exercise prescription taught. Start at a low level workload and increase workload after able to maintain previous level for 30 minutes. Increase time before increasing intensity.   Diabetes No   Hypertension Yes   Goal Participant will see blood pressure controlled within the values of 140/74m/Hg  or within value directed by their physician.   Intervention Provide nutrition & aerobic exercise along with prescribed medications to achieve BP 140/90 or less.   Lipids Yes   Goal Cholesterol controlled with medications as prescribed, with individualized exercise RX and with personalized nutrition plan. Value goals: LDL < '70mg'$ , HDL > '40mg'$ . Participant states understanding of desired cholesterol values and following prescriptions.   Intervention Provide nutrition & aerobic exercise along with prescribed medications to achieve LDL '70mg'$ , HDL >'40mg'$ .   Stress No      Personal Goals and Risk Factors Review:    Personal Goals Discharge (Final Personal Goals and Risk Factors Review):     Comments:  Per RCandiss Norse MS, CEP:  Patient had CABG surgery, special considerations will need to be made during his exercise and during the weights and stretching portion of class.  Per ACSM Guidelines: Sternal bone healing to attain adequate sternal stability is commonly achieved by 8wks which would be June 29th, 2016. Clinical judgement should be used in exercise programming through 12 weeks post surgery (July 27th).  Upper body movements that exert tension on the sternal wound should be avoided. No standard weight limits apply.  ROM exercises and other activities that involve the sternal muscles can be gradually introduced and progressed as long as there is no evidence of sternal instability as detected by movement in the sternum, pain, cracking or popping.   Aerobic exercise: Walking is highly recommended  RPE 11-16 Intensity kept below the ischemic threshold  Resistance exercise: Per patient's MD he has one month left on a 325-montheight lifting restriction. Patient will bring clearance from MD once he is approved to lift weights as part of cardiac rehab program.   Patient to start Cardiac Rehab on Monday, April 09, 2015 at 0800 - 1000.  Patient does not want to come to educational sessions  and stated, "I just want to exercise."

## 2015-03-29 ENCOUNTER — Ambulatory Visit: Payer: PPO | Admitting: *Deleted

## 2015-04-09 ENCOUNTER — Encounter: Payer: PPO | Admitting: *Deleted

## 2015-04-09 DIAGNOSIS — Z951 Presence of aortocoronary bypass graft: Secondary | ICD-10-CM | POA: Diagnosis not present

## 2015-04-09 NOTE — Progress Notes (Signed)
Daily Session Note  Patient Details  Name: Randall Ali. MRN: [id number] Date of Birth: 07-Feb-1941 Referring Provider:  [name], MD  Encounter Date: 04/09/2015  Check In:     Session Check In - 04/09/15 1014    Check-In   Staff Present Candiss Norse MS, ACSM CEP Exercise Physiologist;Susanne Bice RN, BSN, CCRP;Kelly Amedeo Plenty BS, ACSM CEP Exercise Physiologist   ER physicians immediately available to respond to emergencies See telemetry face sheet for immediately available ER MD   Medication changes reported     No   Fall or balance concerns reported    No   Warm-up and Cool-down Performed on first and last piece of equipment   VAD Patient? No   Pain Assessment   Currently in Pain? No/denies   Multiple Pain Sites No           Exercise Prescription Changes - 04/09/15 1000    Response to Exercise   Comments First day of exercise! Patient was oriented to the gym and the equipment functions and settings. Procedures and policies of the gym were outlined and explained. [name] individual exercise prescription and treatment plan were reviewed with them. All starting workloads were established based on the results of the functional testing  done at the initial intake visit. The plan for exercise progression was also introduced and progression will be customized based on the patient's performance and goals.    Resistance Training   Training Prescription Yes   Weight Body weight   Reps 10-15      Goals Met:  Independence with exercise equipment Exercise tolerated well Personal goals reviewed No report of cardiac concerns or symptoms  Goals Unmet:  Not Applicable  Goals Comments: First day of exercise class.    Dr. Emily Filbert is Medical Director for [place] and [facility].

## 2015-04-10 ENCOUNTER — Encounter: Payer: Self-pay | Admitting: *Deleted

## 2015-04-11 ENCOUNTER — Encounter: Payer: PPO | Attending: Cardiothoracic Surgery

## 2015-04-11 ENCOUNTER — Other Ambulatory Visit: Payer: Self-pay | Admitting: Cardiothoracic Surgery

## 2015-04-11 DIAGNOSIS — Z951 Presence of aortocoronary bypass graft: Secondary | ICD-10-CM | POA: Insufficient documentation

## 2015-04-12 ENCOUNTER — Ambulatory Visit (INDEPENDENT_AMBULATORY_CARE_PROVIDER_SITE_OTHER): Payer: Self-pay | Admitting: Cardiothoracic Surgery

## 2015-04-12 ENCOUNTER — Ambulatory Visit
Admission: RE | Admit: 2015-04-12 | Discharge: 2015-04-12 | Disposition: A | Discharge status: Home / Self Care | Payer: PPO | Source: Ambulatory Visit | Attending: Cardiothoracic Surgery | Admitting: Cardiothoracic Surgery

## 2015-04-12 ENCOUNTER — Encounter: Payer: Self-pay | Admitting: Cardiothoracic Surgery

## 2015-04-12 VITALS — BP 112/58 | HR 64 | Resp 20 | Ht 68.0 in | Wt 194.0 lb

## 2015-04-12 DIAGNOSIS — Z951 Presence of aortocoronary bypass graft: Secondary | ICD-10-CM

## 2015-04-12 DIAGNOSIS — I25110 Atherosclerotic heart disease of native coronary artery with unstable angina pectoris: Secondary | ICD-10-CM

## 2015-04-12 NOTE — Progress Notes (Signed)
Randall Ali       [place] [address]             [phone]      [name] [place] Medical Record #[id number] Date of Birth: Oct 08, 1940  Referring: [name], MD Primary Care: [name], MD  Chief Complaint:   POST OP FOLLOW UP 01/24/2015  OPERATIVE REPORT PREOPERATIVE DIAGNOSIS: Coronary occlusive disease with recent onset of angina. 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 SURGICAL PROCEDURE: Coronary artery bypass grafting x4, with left internal mammary to the left anterior descending coronary artery, sequential reverse saphenous vein graft to the first and second intermediate coronary artery, reverse saphenous vein graft to the posterior descending coronary artery, with the left leg greater saphenous thigh and calf endo vein harvesting. SURGEON: Lanelle Bal, M.D.  History of Present Illness:     Patient is making good progress since discharge home, after coronary artery bypass grafting complicated by gram-negative wound incision infection, treated by opening the wound and has had a wound VAC in place. He's had no fever . He has had some night sweats. He's walking up to 2 miles a day. He was disappointed in cardiac rehabilitation, said he could do more on his own.   Past Medical History  Diagnosis Date  . CAD (coronary artery disease)     s/p inferior wall infarct in 10/01. has stent in Rt coronary artery. is due a stress myoview. does have some dyspnea on exertion  . SOB (shortness of breath) on exertion   . 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 Chest pain   . HLD (hyperlipidemia)     isdue followup lipids. on zocor 10 mg/day   . HTN (hypertension)   . Allergy     seasonal  . Cataract     removed  . Reflux esophagitis   . OSA (obstructive sleep apnea) 01/13/2015  . Erectile dysfunction   . Arthritis     all over- in general   . Cancer  ([place])     skin, melanoma  . GERD (gastroesophageal reflux disease)     pt. denies      History  Smoking status  . Former Smoker -- 2.50 packs/day for 40 years  . Types: Cigarettes  . Quit date: 03/24/2000  Smokeless tobacco  . Never Used    History  Alcohol Use No    Comment: occasional- 2 times per week      No Known Allergies  Current Outpatient Prescriptions  Medication Sig Dispense Refill  . aspirin 81 MG EC tablet Take 81 mg by mouth daily.      . Calcium-Vitamin D (RA CALCIUM PLUS VITAMIN D) 600-125 MG-UNIT TABS Take 1 tablet by mouth 2 (two) times daily.     [name] lisinopril (PRINIVIL,ZESTRIL) 10 MG tablet Take 1 tablet (10 mg total) by mouth daily. 90 tablet 3  . metoprolol succinate (TOPROL XL) 25 MG 24 hr tablet Take 1 tablet (25 mg total) by mouth daily. 90 tablet 4  . Probiotic Product (PROBIOTIC PO) Take 2 tablets by mouth daily. accuflora    . simvastatin (ZOCOR) 40 MG tablet Take 1 tablet (40 mg total) by mouth at bedtime. 90 tablet 3  . traMADol (ULTRAM) 50 MG tablet Take 1 tablet (50 mg total) by mouth every 6 (six) hours as needed. (Patient taking differently: Take 50 mg by mouth every 6 (six) hours  as needed for moderate pain. ) 60 tablet 5  . vitamin B-12 (CYANOCOBALAMIN) 1000 MCG tablet Take 1,000 mcg by mouth daily.    . vitamin C (ASCORBIC ACID) 500 MG tablet Take 500 mg by mouth daily.    . Zinc 100 MG TABS Take 1 tablet by mouth daily.       No current facility-administered medications for this visit.       Physical Exam: BP 112/58 mmHg  Pulse 64  Resp 20  Ht '5\' 8"'$  (1.727 m)  Wt 194 lb (87.998 kg)  BMI 29.50 kg/m2  SpO2 92%  General appearance: alert and cooperative Neurologic: intact Heart: regular rate and rhythm, S1, S2 normal, no murmur, click, rub or gallop Lungs: clear to auscultation bilaterally Abdomen: soft, non-tender; bowel sounds normal; no masses,  no organomegaly Extremities: extremities normal, atraumatic, no cyanosis or  edema and Homans sign is negative, no sign of DVT Wound: The patient sternotomy incision is now completely healed, there is no surrounding erythema or evidence of infection, on exam the bone is stable  Diagnostic Studies & Laboratory data:     Recent Radiology Findings:   Dg Chest 2 View  04/12/2015  CLINICAL DATA:  History of CABG, shortness of breath EXAM: CHEST  2 VIEW COMPARISON:  Chest x-ray of 03/08/2015 FINDINGS: No active infiltrate or effusion is seen. Mediastinal and hilar contours are unremarkable. The heart is mildly enlarged and stable. Median sternotomy sutures are noted from prior CABG. There are degenerative changes throughout the thoracic spine. IMPRESSION: No active lung disease.  Stable mild cardiomegaly. Electronically Signed   By: Ivar Drape M.D.   On: 04/12/2015 15:09      Recent Lab Findings: Lab Results  Component Value Date   WBC 10.6 02/12/2015   HGB 11.1* 02/12/2015   HCT 34.2* 02/12/2015   PLT 418 02/12/2015   GLUCOSE 114* 02/12/2015   CHOL 144 11/24/2014   TRIG 71.0 11/24/2014   HDL 46.90 11/24/2014   LDLDIRECT 78.0 04/05/2013   LDLCALC 83 11/24/2014   ALT 63 02/02/2015   AST 50* 02/02/2015   NA 134* 02/12/2015   K 3.5 02/12/2015   CL 94* 02/12/2015   CREATININE 1.06 02/12/2015   BUN 16 02/12/2015   CO2 29 02/12/2015   TSH 3.318 02/02/2015   INR 1.34 01/24/2015   HGBA1C 5.8* 01/22/2015      Assessment / Plan:   Patient is making progress following coronary artery bypass grafting complicated by wound infection, his sternal wound is now completely healed sternum is stable. His PICC line and antibiotics have been completed He is now off of amiodarone Patient will be continued to be followed by cardiology, I have not made him a return appointment but would be glad to see him at his or cardiology request at any time.  [name] MD      [address] [place] [address] Office [phone]   Beeper  [phone]  04/12/2015 3:41 PM

## 2015-04-13 ENCOUNTER — Encounter: Payer: Self-pay | Admitting: Internal Medicine

## 2015-04-13 ENCOUNTER — Ambulatory Visit (INDEPENDENT_AMBULATORY_CARE_PROVIDER_SITE_OTHER): Payer: PPO | Admitting: Internal Medicine

## 2015-04-13 VITALS — BP 115/68 | HR 64 | Temp 97.6°F | Ht 69.0 in | Wt 197.4 lb

## 2015-04-13 DIAGNOSIS — R519 Headache, unspecified: Secondary | ICD-10-CM

## 2015-04-13 DIAGNOSIS — Z23 Encounter for immunization: Secondary | ICD-10-CM

## 2015-04-13 DIAGNOSIS — E785 Hyperlipidemia, unspecified: Secondary | ICD-10-CM | POA: Diagnosis not present

## 2015-04-13 DIAGNOSIS — R51 Headache: Secondary | ICD-10-CM

## 2015-04-13 DIAGNOSIS — G4733 Obstructive sleep apnea (adult) (pediatric): Secondary | ICD-10-CM

## 2015-04-13 DIAGNOSIS — I25111 Atherosclerotic heart disease of native coronary artery with angina pectoris with documented spasm: Secondary | ICD-10-CM

## 2015-04-13 DIAGNOSIS — R351 Nocturia: Secondary | ICD-10-CM | POA: Diagnosis not present

## 2015-04-13 DIAGNOSIS — Z79899 Other long term (current) drug therapy: Secondary | ICD-10-CM | POA: Diagnosis not present

## 2015-04-13 DIAGNOSIS — I10 Essential (primary) hypertension: Secondary | ICD-10-CM

## 2015-04-13 LAB — URINALYSIS, ROUTINE W REFLEX MICROSCOPIC
Bilirubin Urine: NEGATIVE
Hgb urine dipstick: NEGATIVE
Ketones, ur: NEGATIVE
Leukocytes, UA: NEGATIVE
Nitrite: NEGATIVE
RBC / HPF: NONE SEEN (ref 0–?)
Specific Gravity, Urine: 1.01 (ref 1.000–1.030)
Total Protein, Urine: NEGATIVE
Urine Glucose: NEGATIVE
Urobilinogen, UA: 0.2 (ref 0.0–1.0)
WBC, UA: NONE SEEN (ref 0–?)
pH: 6 (ref 5.0–8.0)

## 2015-04-13 LAB — POCT URINALYSIS DIPSTICK
Bilirubin, UA: NEGATIVE
Blood, UA: NEGATIVE
Glucose, UA: NEGATIVE
Ketones, UA: NEGATIVE
Leukocytes, UA: NEGATIVE
Nitrite, UA: NEGATIVE
Protein, UA: NEGATIVE
Spec Grav, UA: 1.01
Urobilinogen, UA: 0.2
pH, UA: 5.5

## 2015-04-13 MED ORDER — ZOLPIDEM TARTRATE 10 MG PO TABS: 10.0000 mg | ORAL_TABLET | Freq: Every evening | ORAL | Status: DC | PRN

## 2015-04-13 MED ORDER — TETANUS-DIPHTH-ACELL PERTUSSIS 5-2.5-18.5 LF-MCG/0.5 IM SUSP: 0.5000 mL | Freq: Once | INTRAMUSCULAR | Status: DC

## 2015-04-13 NOTE — Progress Notes (Signed)
Pre visit review using our clinic review tool, if applicable. No additional management support is needed unless otherwise documented below in the visit note. 

## 2015-04-13 NOTE — Progress Notes (Signed)
Subjective:  Patient ID: Randall Kung., male    DOB: [birth year]  Age: 74 y.o. MRN: [id number]  CC: The primary encounter diagnosis was Hyperlipidemia. Diagnoses of Long-term use of high-risk medication, Nocturia, Need for vaccination with 13-polyvalent pneumococcal conjugate vaccine, Essential hypertension, OSA (obstructive sleep apnea), Coronary artery disease involving native coronary artery of native heart with angina pectoris with documented spasm ([place]), and Chronic daily headache were also pertinent to this visit.  HPI Randall Ali. presents for follow up on chronic issues including ischemic chest pain,  Chronic foot pain , hypertensions and fatty liver.  Last seen in  August prior to cardiac catheterization.   He was hospitalized  In August after his August 11 cath released multivessel occlusive disease.  He underwent  CABG x 4 on August 17 which was complicated by IV amiodarone before converting to NSR.  He then developed delirium and hypoxic respiratory failure requiring reintubation.  He developed a sternal GNR wound incision infection requiring bedside debridement and wound VAC placement on POD #7.  [name]  He was extubated on POD #8, was transitioned to oral diet after several days when 2nd barium swallow noted resolution of dysphagia,  He was discharged on Sept 2 , POD #14.  He received six weeks of IV Rocephin via PICC line for Enterobacter aerogenes which was managed by ID .  ESR and CRP were normal on October 13th , one week after the Rocephin was discontinued  He states that he has been feeling good for 3 weeks, since his antibiotics and the amiodarone were  Stopped.    He notes that his chronic foot right second toe pain accompanied by redness and swelling that has persisted since foot surgery at [facility] in May (he was treated  By [facility] for post op infection with one round of doxycycline for documented MSSA infection)  has also resolved.    He has noted Insomnia since  discharge and is  using Zquil.  Waking up at 1 am a lot.  Goes to bed at 10 pm.  Having nocturia x 3-4.  Does not drink excessive  liquids at night.    Outpatient Prescriptions Prior to Visit  Medication Sig Dispense Refill  . aspirin 81 MG EC tablet Take 81 mg by mouth daily.      . Calcium-Vitamin D (RA CALCIUM PLUS VITAMIN D) 600-125 MG-UNIT TABS Take 1 tablet by mouth 2 (two) times daily.     [name] lisinopril (PRINIVIL,ZESTRIL) 10 MG tablet Take 1 tablet (10 mg total) by mouth daily. 90 tablet 3  . metoprolol succinate (TOPROL XL) 25 MG 24 hr tablet Take 1 tablet (25 mg total) by mouth daily. 90 tablet 4  . Probiotic Product (PROBIOTIC PO) Take 2 tablets by mouth daily. accuflora    . simvastatin (ZOCOR) 40 MG tablet Take 1 tablet (40 mg total) by mouth at bedtime. 90 tablet 3  . traMADol (ULTRAM) 50 MG tablet Take 1 tablet (50 mg total) by mouth every 6 (six) hours as needed. (Patient taking differently: Take 50 mg by mouth every 6 (six) hours as needed for moderate pain. ) 60 tablet 5  . vitamin B-12 (CYANOCOBALAMIN) 1000 MCG tablet Take 1,000 mcg by mouth daily.    . vitamin C (ASCORBIC ACID) 500 MG tablet Take 500 mg by mouth daily.    . Zinc 100 MG TABS Take 1 tablet by mouth daily.       No facility-administered medications prior to visit.  Review of Systems;  Patient denies headache, fevers, malaise, unintentional weight loss, skin rash, eye pain, sinus congestion and sinus pain, sore throat, dysphagia,  hemoptysis , cough, dyspnea, wheezing, chest pain, palpitations, orthopnea, edema, abdominal pain, nausea, melena, diarrhea, constipation, flank pain, dysuria, hematuria, urinary  Frequency, nocturia, numbness, tingling, seizures,  Focal weakness, Loss of consciousness,  Tremor, insomnia, depression, anxiety, and suicidal ideation.      Objective:  BP 115/68 mmHg  Pulse 64  Temp(Src) 97.6 F (36.4 C) (Oral)  Ht _0  (1.753 m)  Wt 197 lb 6 oz (89.529 kg)  BMI 29.13 kg/m2   SpO2 97%  BP Readings from Last 3 Encounters:  04/13/15 115/68  04/12/15 112/58  03/27/15 158/86    Wt Readings from Last 3 Encounters:  04/13/15 197 lb 6 oz (89.529 kg)  04/12/15 194 lb (87.998 kg)  03/28/15 194 lb 6.4 oz (88.179 kg)    General appearance: alert, cooperative and appears stated age Ears: normal TM's and external ear canals both ears Throat: lips, mucosa, and tongue normal; teeth and gums normal Neck: no adenopathy, no carotid bruit, supple, symmetrical, trachea midline and thyroid not enlarged, symmetric, no tenderness/mass/nodules Back: symmetric, no curvature. ROM normal. No CVA tenderness. Lungs: clear to auscultation bilaterally Heart: regular rate and rhythm, S1, S2 normal, no murmur, click, rub or gallop Abdomen: soft, non-tender; bowel sounds normal; no masses,  no organomegaly Pulses: 2+ and symmetric Skin: Skin color, texture, turgor normal. No rashes or lesions Lymph nodes: Cervical, supraclavicular, and axillary nodes normal.  Lab Results  Component Value Date   HGBA1C 5.8* 01/22/2015    Lab Results  Component Value Date   CREATININE 1.06 02/12/2015   CREATININE 1.45* 02/07/2015   CREATININE 1.42* 02/06/2015    Lab Results  Component Value Date   WBC 10.6 02/12/2015   HGB 11.1* 02/12/2015   HCT 34.2* 02/12/2015   PLT 418 02/12/2015   GLUCOSE 114* 02/12/2015   CHOL 144 11/24/2014   TRIG 71.0 11/24/2014   HDL 46.90 11/24/2014   LDLDIRECT 78.0 04/05/2013   LDLCALC 83 11/24/2014   ALT 63 02/02/2015   AST 50* 02/02/2015   NA 134* 02/12/2015   K 3.5 02/12/2015   CL 94* 02/12/2015   CREATININE 1.06 02/12/2015   BUN 16 02/12/2015   CO2 29 02/12/2015   TSH 3.318 02/02/2015   PSA 0.28 11/24/2014   INR 1.34 01/24/2015   HGBA1C 5.8* 01/22/2015    Dg Chest 2 View  04/12/2015  CLINICAL DATA:  History of CABG, shortness of breath EXAM: CHEST  2 VIEW COMPARISON:  Chest x-ray of 03/08/2015 FINDINGS: No active infiltrate or effusion is seen.  Mediastinal and hilar contours are unremarkable. The heart is mildly enlarged and stable. Median sternotomy sutures are noted from prior CABG. There are degenerative changes throughout the thoracic spine. IMPRESSION: No active lung disease.  Stable mild cardiomegaly. Electronically Signed   By: Ivar Drape M.D.   On: 04/12/2015 15:09    Assessment & Plan:   Problem List Items Addressed This Visit    HTN (hypertension)    Well controlled on current regimen. Renal function stable, no changes today.  Lab Results  Component Value Date   CREATININE 1.06 02/12/2015   Lab Results  Component Value Date   NA 134* 02/12/2015   K 3.5 02/12/2015   CL 94* 02/12/2015   CO2 29 02/12/2015         Chronic daily headache    Likely due to nocturnal  hypoxia given mild to moderate OSA with hypoxia.  CPAP titration study ordered.       Coronary artery disease involving native coronary artery of native heart with angina pectoris with documented spasm ([place])    Now s/p 4 vessel CABG.   H eis taking ACE I ,  Beta blocker ASA  And statin He will return for fasting lipids       OSA (obstructive sleep apnea)    Mild to mod severe By sleep study July 2016, with 25% of study time spent with O2 sats < 90% .   Titration study was postponed per patinet request until after cardiac issues were addressed.  Now that his cardiac issues have resolved,  Will schedule his titration study.      Relevant Orders   Nocturnal polysomnography (NPSG)   Hyperlipidemia - Primary   Relevant Orders   LDL cholesterol, direct   Lipid panel    Other Visit Diagnoses    Long-term use of high-risk medication        Relevant Orders    CBC with Differential/Platelet    Comprehensive metabolic panel    Nocturia        Relevant Orders    POCT urinalysis dipstick (Completed)    Urinalysis, Routine w reflex microscopic (not at [facility]) (Completed)    Urine culture    Need for vaccination with 13-polyvalent pneumococcal conjugate  vaccine        Relevant Orders    Pneumococcal conjugate vaccine 13-valent (Completed)      A total of 40 minutes of face to face time was spent with patient more than half of which was spent in counselling about the above mentioned conditions  and coordination of care   I am having Mr. Enfield start on Tdap. I am also having him maintain his aspirin, Calcium-Vitamin D, Zinc, traMADol, vitamin B-12, vitamin C, Probiotic Product (PROBIOTIC PO), metoprolol succinate, simvastatin, lisinopril, and zolpidem.  Meds ordered this encounter  Medications  . zolpidem (AMBIEN) 10 MG tablet    Sig: Take 1 tablet (10 mg total) by mouth at bedtime as needed for sleep.    Dispense:  15 tablet    Refill:  1  . Tdap (BOOSTRIX) 5-2.5-18.5 LF-MCG/0.5 injection    Sig: Inject 0.5 mLs into the muscle once.    Dispense:  0.5 mL    Refill:  0    Medications Discontinued During This Encounter  Medication Reason  . zolpidem (AMBIEN) 10 MG tablet Reorder    Follow-up: No Follow-up on file.   [name], MD

## 2015-04-13 NOTE — Patient Instructions (Signed)
You are doing well!  Thank God!  I am refilling the Lorrin Mais   We are checking your urine today for signs of infection  Return in late December for fasting labs   You received the Prevnar (Pneumonia vaccine ) today   Your still need your tetanus-diptheria-pertussis vaccine (TDaP) but you can get it for less $$$ at [facility] with the script I have provided you.

## 2015-04-15 ENCOUNTER — Encounter: Payer: Self-pay | Admitting: Internal Medicine

## 2015-04-15 LAB — URINE CULTURE: Colony Count: 50000

## 2015-04-15 NOTE — Assessment & Plan Note (Signed)
Now s/p 4 vessel CABG.   H eis taking ACE I ,  Beta blocker ASA  And statin He will return for fasting lipids

## 2015-04-15 NOTE — Assessment & Plan Note (Signed)
Likely due to nocturnal hypoxia given mild to moderate OSA with hypoxia.  CPAP titration study ordered.

## 2015-04-15 NOTE — Assessment & Plan Note (Signed)
Mild to mod severe By sleep study July 2016, with 25% of study time spent with O2 sats < 90% .   Titration study was postponed per patinet request until after cardiac issues were addressed.  Now that his cardiac issues have resolved,  Will schedule his titration study.

## 2015-04-15 NOTE — Assessment & Plan Note (Signed)
Well controlled on current regimen. Renal function stable, no changes today.  Lab Results  Component Value Date   CREATININE 1.06 02/12/2015   Lab Results  Component Value Date   NA 134* 02/12/2015   K 3.5 02/12/2015   CL 94* 02/12/2015   CO2 29 02/12/2015

## 2015-04-17 ENCOUNTER — Encounter: Payer: Self-pay | Admitting: *Deleted

## 2015-04-17 DIAGNOSIS — Z951 Presence of aortocoronary bypass graft: Secondary | ICD-10-CM

## 2015-04-17 NOTE — Progress Notes (Signed)
Cardiac Individual Treatment Plan  Patient Details  Name: Randall Ali. MRN: [id number] Date of Birth: Dec 02, 1940 Referring Provider:  [name], MD  Initial Encounter Date:    Visit Diagnosis: No diagnosis found.  Patient's Home Medications on Admission:  Current outpatient prescriptions:  .  aspirin 81 MG EC tablet, Take 81 mg by mouth daily.  , Disp: , Rfl:  .  Calcium-Vitamin D (RA CALCIUM PLUS VITAMIN D) 600-125 MG-UNIT TABS, Take 1 tablet by mouth 2 (two) times daily. , Disp: , Rfl:  .  lisinopril (PRINIVIL,ZESTRIL) 10 MG tablet, Take 1 tablet (10 mg total) by mouth daily., Disp: 90 tablet, Rfl: 3 .  metoprolol succinate (TOPROL XL) 25 MG 24 hr tablet, Take 1 tablet (25 mg total) by mouth daily., Disp: 90 tablet, Rfl: 4 .  Probiotic Product (PROBIOTIC PO), Take 2 tablets by mouth daily. accuflora, Disp: , Rfl:  .  simvastatin (ZOCOR) 40 MG tablet, Take 1 tablet (40 mg total) by mouth at bedtime., Disp: 90 tablet, Rfl: 3 .  Tdap (BOOSTRIX) 5-2.5-18.5 LF-MCG/0.5 injection, Inject 0.5 mLs into the muscle once., Disp: 0.5 mL, Rfl: 0 .  traMADol (ULTRAM) 50 MG tablet, Take 1 tablet (50 mg total) by mouth every 6 (six) hours as needed. (Patient taking differently: Take 50 mg by mouth every 6 (six) hours as needed for moderate pain. ), Disp: 60 tablet, Rfl: 5 .  vitamin B-12 (CYANOCOBALAMIN) 1000 MCG tablet, Take 1,000 mcg by mouth daily., Disp: , Rfl:  .  vitamin C (ASCORBIC ACID) 500 MG tablet, Take 500 mg by mouth daily., Disp: , Rfl:  .  Zinc 100 MG TABS, Take 1 tablet by mouth daily.  , Disp: , Rfl:  .  zolpidem (AMBIEN) 10 MG tablet, Take 1 tablet (10 mg total) by mouth at bedtime as needed for sleep., Disp: 15 tablet, Rfl: 1  Past Medical History: Past Medical History  Diagnosis Date  . CAD (coronary artery disease)     s/p inferior wall infarct in 10/01. has stent in Rt coronary artery. is due a stress myoview. does have some dyspnea on exertion  . SOB  (shortness of breath) on exertion   . 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 Chest pain   . HLD (hyperlipidemia)     isdue followup lipids. on zocor 10 mg/day   . HTN (hypertension)   . Allergy     seasonal  . Cataract     removed  . Reflux esophagitis   . OSA (obstructive sleep apnea) 01/13/2015  . Erectile dysfunction   . Arthritis     all over- in general   . Cancer ([place])     skin, melanoma  . GERD (gastroesophageal reflux disease)     pt. denies     Tobacco Use: History  Smoking status  . Former Smoker -- 2.50 packs/day for 40 years  . Types: Cigarettes  . Quit date: 03/24/2000  Smokeless tobacco  . Never Used    Labs: Recent Review Flowsheet Data    Labs for ITP Cardiac and Pulmonary Rehab Latest Ref Rng 01/28/2015 01/28/2015 01/29/2015 01/30/2015 01/30/2015   PHART 7.350 - 7.450 7.473(H) - 7.518(H) 7.508(H) 7.513(H)   PCO2ART 35.0 - 45.0 mmHg 36.4 - 34.7(L) 38.4 37.8   HCO3 20.0 - 24.0 mEq/L 26.6(H) - 28.3(H) 30.5(H) 30.2(H)   TCO2 0 - 100 mmol/L '28 25 29 '$ 32 31.4   O2SAT - 92.0 - 94.0 95.0 99.1  Exercise Target Goals:    Exercise Program Goal: Individual exercise prescription set with THRR, safety & activity barriers. Participant demonstrates ability to understand and report RPE using BORG scale, to self-measure pulse accurately, and to acknowledge the importance of the exercise prescription.  Exercise Prescription Goal: Starting with aerobic activity 30 plus minutes a day, 3 days per week for initial exercise prescription. Provide home exercise prescription and guidelines that participant acknowledges understanding prior to discharge.  Activity Barriers & Risk Stratification:     Activity Barriers & Risk Stratification - 03/28/15 1506    Activity Barriers & Risk Stratification   Activity Barriers Other (comment);Arthritis;Joint Problems  Pt has had bilateral shoulder, elbow, and wrist surgery.  Pt has had surgery on right foot x 3  over the past three years.  Patient wanting to get back to weight lifting and playing softball.  Pt does c/o of pain in right foot at times with walking.     Risk Stratification High      6 Minute Walk:     6 Minute Walk      03/28/15 1426       6 Minute Walk   Phase Initial     Distance 1500 feet     Walk Time 6 minutes     Resting HR 73 bpm     Resting BP 142/80 mmHg     Max Ex. HR 105 bpm     Max Ex. BP 164/72 mmHg     RPE 11     Symptoms No        Initial Exercise Prescription:     Initial Exercise Prescription - 03/28/15 1400    Date of Initial Exercise Prescription   Date 03/28/15   Treadmill   MPH 2.3   Grade 0   Minutes 15   Bike   Level 0.4   Minutes 10   Recumbant Bike   Level 3   RPM 45   Watts 25   Minutes 15   NuStep   Level 3   Watts 30   Minutes 15   Arm Ergometer   Level 1   Watts 8   Minutes 10   Arm/Foot Ergometer   Level 4   Watts 12   Minutes 10   Cybex   Level 3   RPM 50   Minutes 15   Recumbant Elliptical   Level 2   RPM 40   Watts 20   Minutes 15   Elliptical   Level 1   Speed 3   Minutes 1   REL-XR   Level 2   Watts 30   Minutes 15   Prescription Details   Frequency (times per week) 3   Duration Progress to 30 minutes of continuous aerobic without signs/symptoms of physical distress   Intensity   THRR REST +  30   Ratings of Perceived Exertion 11-15   Progression Continue progressive overload as per policy without signs/symptoms or physical distress.   Resistance Training   Training Prescription No  One month left on a 3 month weight restriction      Exercise Prescription Changes:     Exercise Prescription Changes      04/09/15 1000 04/10/15 0600         Exercise Review   Progression  No  Only one recorded visit so far      Response to Exercise   Blood Pressure (Admit)  146/70 mmHg      Blood Pressure (Exercise)  150/84 mmHg      Blood Pressure (Exit)  110/64 mmHg      Heart Rate (Admit)  72  bpm      Heart Rate (Exercise)  106 bpm      Heart Rate (Exit)  77 bpm      Rating of Perceived Exertion (Exercise)  13      Symptoms  None      Comments First day of exercise! Patient was oriented to the gym and the equipment functions and settings. Procedures and policies of the gym were outlined and explained. [name] individual exercise prescription and treatment plan were reviewed with them. All starting workloads were established based on the results of the functional testing  done at the initial intake visit. The plan for exercise progression was also introduced and progression will be customized based on the patient's performance and goals.        Duration  Progress to 30 minutes of continuous aerobic without signs/symptoms of physical distress      Intensity  Rest + 30      Progression  Continue progressive overload as per policy without signs/symptoms or physical distress.      Resistance Training   Training Prescription Yes Yes      Weight Body weight Body weight only      Reps 10-15 10-15      Interval Training   Interval Training  No      Treadmill   MPH  3      Grade  0      Minutes  15      Recumbant Bike   Level  3      RPM  40      Watts  30      Minutes  15         Discharge Exercise Prescription (Final Exercise Prescription Changes):     Exercise Prescription Changes - 04/10/15 0600    Exercise Review   Progression No  Only one recorded visit so far   Response to Exercise   Blood Pressure (Admit) 146/70 mmHg   Blood Pressure (Exercise) 150/84 mmHg   Blood Pressure (Exit) 110/64 mmHg   Heart Rate (Admit) 72 bpm   Heart Rate (Exercise) 106 bpm   Heart Rate (Exit) 77 bpm   Rating of Perceived Exertion (Exercise) 13   Symptoms None   Duration Progress to 30 minutes of continuous aerobic without signs/symptoms of physical distress   Intensity Rest + 30   Progression Continue progressive overload as per policy without signs/symptoms or physical  distress.   Resistance Training   Training Prescription Yes   Weight Body weight only   Reps 10-15   Interval Training   Interval Training No   Treadmill   MPH 3   Grade 0   Minutes 15   Recumbant Bike   Level 3   RPM 40   Watts 30   Minutes 15      Nutrition:  Target Goals: Understanding of nutrition guidelines, daily intake of sodium '1500mg'$ , cholesterol '200mg'$ , calories 30% from fat and 7% or less from saturated fats, daily to have 5 or more servings of fruits and vegetables.  Biometrics:     Pre Biometrics - 03/28/15 1423    Pre Biometrics   Height 5' 8.5" (1.74 m)   Weight 194 lb 6.4 oz (88.179 kg)   Waist Circumference 40.5 inches   Hip Circumference 37.75 inches   Waist to Hip Ratio 1.07 %  BMI (Calculated) 29.2       Nutrition Therapy Plan and Nutrition Goals:   Nutrition Discharge: Rate Your Plate Scores:   Nutrition Goals Re-Evaluation:   Psychosocial: Target Goals: Acknowledge presence or absence of depression, maximize coping skills, provide positive support system. Participant is able to verbalize types and ability to use techniques and skills needed for reducing stress and depression.  Initial Review & Psychosocial Screening:     Initial Psych Review & Screening - 03/28/15 1516    Initial Review   Current issues with --  Patient is just ready to exericise.  Pt. denies depression and states he does not feel stressed.     Family Dynamics   Good Support System? Yes  Patietn is divorced.  Has 3 children and 4 grandchildren.  He attends [employer].  Emergency Contact - [name] Winstead Daughter [phone].     Barriers   Psychosocial barriers to participate in program There are no identifiable barriers or psychosocial needs.   Screening Interventions   Interventions Encouraged to exercise      Quality of Life Scores:     Quality of Life - 03/28/15 1519    Quality of Life Scores   Health/Function Pre 28.8 %    Socioeconomic Pre 30 %   Psych/Spiritual Pre 30 %   Family Pre 30 %   GLOBAL Pre 29.45 %      PHQ-9:     Recent Review Flowsheet Data    Depression screen [facility] 2/9 03/28/2015 03/22/2015 03/12/2015 11/24/2014 11/02/2014   Decreased Interest 1 0 1 0 0   Down, Depressed, Hopeless 0 0 0 0 0   PHQ - 2 Score 1 0 1 0 0   Altered sleeping 3 - - - -   Tired, decreased energy 1 - - - -   Change in appetite 0 - - - -   Feeling bad or failure about yourself  0 - - - -   Trouble concentrating 0 - - - -   Moving slowly or fidgety/restless 0 - - - -   Suicidal thoughts 0 - - - -   PHQ-9 Score 5 - - - -   Difficult doing work/chores Not difficult at all - - - -      Psychosocial Evaluation and Intervention:   Psychosocial Re-Evaluation:   Vocational Rehabilitation: Provide vocational rehab assistance to qualifying candidates.   Vocational Rehab Evaluation & Intervention:     Vocational Rehab - 03/28/15 1513    Initial Vocational Rehab Evaluation & Intervention   Assessment shows need for Vocational Rehabilitation No      Education: Education Goals: Education classes will be provided on a weekly basis, covering required topics. Participant will state understanding/return demonstration of topics presented.  Learning Barriers/Preferences:     Learning Barriers/Preferences - 03/28/15 1510    Learning Barriers/Preferences   Learning Barriers None   Learning Preferences Verbal Instruction      Education Topics: General Nutrition Guidelines/Fats and Fiber: -Group instruction provided by verbal, written material, models and posters to present the general guidelines for heart healthy nutrition. Gives an explanation and review of dietary fats and fiber.   Controlling Sodium/Reading Food Labels: -Group verbal and written material supporting the discussion of sodium use in heart healthy nutrition. Review and explanation with models, verbal and written materials for utilization of the  food label.   Exercise Physiology & Risk Factors: - Group verbal and written instruction with models to review the exercise physiology of  the cardiovascular system and associated critical values. Details cardiovascular disease risk factors and the goals associated with each risk factor.   Aerobic Exercise & Resistance Training: - Gives group verbal and written discussion on the health impact of inactivity. On the components of aerobic and resistive training programs and the benefits of this training and how to safely progress through these programs.   Flexibility, Balance, General Exercise Guidelines: - Provides group verbal and written instruction on the benefits of flexibility and balance training programs. Provides general exercise guidelines with specific guidelines to those with heart or lung disease. Demonstration and skill practice provided.   Stress Management: - Provides group verbal and written instruction about the health risks of elevated stress, cause of high stress, and healthy ways to reduce stress.   Depression: - Provides group verbal and written instruction on the correlation between heart/lung disease and depressed mood, treatment options, and the stigmas associated with seeking treatment.   Anatomy & Physiology of the Heart: - Group verbal and written instruction and models provide basic cardiac anatomy and physiology, with the coronary electrical and arterial systems. Review of: AMI, Angina, Valve disease, Heart Failure, Cardiac Arrhythmia, Pacemakers, and the ICD.   Cardiac Procedures: - Group verbal and written instruction and models to describe the testing methods done to diagnose heart disease. Reviews the outcomes of the test results. Describes the treatment choices: Medical Management, Angioplasty, or Coronary Bypass Surgery.   Cardiac Medications: - Group verbal and written instruction to review commonly prescribed medications for heart disease. Reviews the  medication, class of the drug, and side effects. Includes the steps to properly store meds and maintain the prescription regimen.   Go Sex-Intimacy & Heart Disease, Get SMART - Goal Setting: - Group verbal and written instruction through game format to discuss heart disease and the return to sexual intimacy. Provides group verbal and written material to discuss and apply goal setting through the application of the S.M.A.R.T. Method.   Other Matters of the Heart: - Provides group verbal, written materials and models to describe Heart Failure, Angina, Valve Disease, and Diabetes in the realm of heart disease. Includes description of the disease process and treatment options available to the cardiac patient.   Exercise & Equipment Safety: - Individual verbal instruction and demonstration of equipment use and safety with use of the equipment.          Cardiac Rehab from 03/28/2015 in [facility]   Date  03/28/15   Educator  D.Joya Gaskins, RN   Instruction Review Code  1- partially meets, needs review/practice      Infection Prevention: - Provides verbal and written material to individual with discussion of infection control including proper hand washing and proper equipment cleaning during exercise session.      Cardiac Rehab from 03/28/2015 in [facility]   Date  03/28/15   Educator  D. Joya Gaskins, RN   Instruction Review Code  2- meets goals/outcomes      Falls Prevention: - Provides verbal and written material to individual with discussion of falls prevention and safety.      Cardiac Rehab from 03/28/2015 in [facility] Cardiac Rehab   Date  03/28/15   Educator  D. Joya Gaskins, RN   Instruction Review Code  2- meets goals/outcomes      Diabetes: - Individual verbal and written instruction to review signs/symptoms of diabetes, desired ranges of glucose level fasting, after meals and with exercise. Advice that pre and post exercise glucose checks will be done  for 3 sessions at entry of  program.    Knowledge Questionnaire Score:     Knowledge Questionnaire Score - 03/28/15 1510    Knowledge Questionnaire Score   Pre Score 19/28      Personal Goals and Risk Factors at Admission:     Personal Goals and Risk Factors at Admission - 03/28/15 1515    Personal Goals and Risk Factors on Admission   Increase Aerobic Exercise and Physical Activity Yes   Intervention While in program, learn and follow the exercise prescription taught. Start at a low level workload and increase workload after able to maintain previous level for 30 minutes. Increase time before increasing intensity.   Diabetes No   Hypertension Yes   Goal Participant will see blood pressure controlled within the values of 140/35m/Hg or within value directed by their physician.   Intervention Provide nutrition & aerobic exercise along with prescribed medications to achieve BP 140/90 or less.   Lipids Yes   Goal Cholesterol controlled with medications as prescribed, with individualized exercise RX and with personalized nutrition plan. Value goals: LDL < '70mg'$ , HDL > '40mg'$ . Participant states understanding of desired cholesterol values and following prescriptions.   Intervention Provide nutrition & aerobic exercise along with prescribed medications to achieve LDL '70mg'$ , HDL >'40mg'$ .   Stress No      Personal Goals and Risk Factors Review:    Personal Goals Discharge (Final Personal Goals and Risk Factors Review):     Comments:  Comments: Patient came to the Cardiac Rehab initial orientation on October 19th, 2016.  He attended his first session on October 31st, 2016.  The following day he left a message stating he would not be returning to Cardiac Rehab.  He stated, "I'm just going to do it on my own!"  Patient is walking 2 miles per day at home.

## 2015-04-17 NOTE — Addendum Note (Signed)
Addended by: Felipe Drone on: 04/17/2015 03:59 PM   Modules accepted: Orders

## 2015-04-17 NOTE — Progress Notes (Signed)
Discharge Summary  Patient Details  Name: Randall Ali. MRN: [id number] Date of Birth: 08-12-1940 Referring Provider:  [name], MD   Number of Visits: 2  Reason for Discharge:  Early Exit:  Patient wants to exercise at home on his own.    Smoking History:  History  Smoking status  . Former Smoker -- 2.50 packs/day for 40 years  . Types: Cigarettes  . Quit date: 03/24/2000  Smokeless tobacco  . Never Used    Diagnosis:  No diagnosis found.  ADL UCSD:   Initial Exercise Prescription:     Initial Exercise Prescription - 03/28/15 1400    Date of Initial Exercise Prescription   Date 03/28/15   Treadmill   MPH 2.3   Grade 0   Minutes 15   Bike   Level 0.4   Minutes 10   Recumbant Bike   Level 3   RPM 45   Watts 25   Minutes 15   NuStep   Level 3   Watts 30   Minutes 15   Arm Ergometer   Level 1   Watts 8   Minutes 10   Arm/Foot Ergometer   Level 4   Watts 12   Minutes 10   Cybex   Level 3   RPM 50   Minutes 15   Recumbant Elliptical   Level 2   RPM 40   Watts 20   Minutes 15   Elliptical   Level 1   Speed 3   Minutes 1   REL-XR   Level 2   Watts 30   Minutes 15   Prescription Details   Frequency (times per week) 3   Duration Progress to 30 minutes of continuous aerobic without signs/symptoms of physical distress   Intensity   THRR REST +  30   Ratings of Perceived Exertion 11-15   Progression Continue progressive overload as per policy without signs/symptoms or physical distress.   Resistance Training   Training Prescription No  One month left on a 3 month weight restriction      Discharge Exercise Prescription (Final Exercise Prescription Changes):     Exercise Prescription Changes - 04/10/15 0600    Exercise Review   Progression No  Only one recorded visit so far   Response to Exercise   Blood Pressure (Admit) 146/70 mmHg   Blood Pressure (Exercise) 150/84 mmHg   Blood Pressure (Exit) 110/64 mmHg   Heart  Rate (Admit) 72 bpm   Heart Rate (Exercise) 106 bpm   Heart Rate (Exit) 77 bpm   Rating of Perceived Exertion (Exercise) 13   Symptoms None   Duration Progress to 30 minutes of continuous aerobic without signs/symptoms of physical distress   Intensity Rest + 30   Progression Continue progressive overload as per policy without signs/symptoms or physical distress.   Resistance Training   Training Prescription Yes   Weight Body weight only   Reps 10-15   Interval Training   Interval Training No   Treadmill   MPH 3   Grade 0   Minutes 15   Recumbant Bike   Level 3   RPM 40   Watts 30   Minutes 15      Functional Capacity:     6 Minute Walk      03/28/15 1426       6 Minute Walk   Phase Initial     Distance 1500 feet     Walk Time 6 minutes  Resting HR 73 bpm     Resting BP 142/80 mmHg     Max Ex. HR 105 bpm     Max Ex. BP 164/72 mmHg     RPE 11     Symptoms No        Psychological, QOL, Others - Outcomes: PHQ 2/9: Depression screen [facility] 2/9 03/28/2015 03/22/2015 03/12/2015 11/24/2014 11/02/2014  Decreased Interest 1 0 1 0 0  Down, Depressed, Hopeless 0 0 0 0 0  PHQ - 2 Score 1 0 1 0 0  Altered sleeping 3 - - - -  Tired, decreased energy 1 - - - -  Change in appetite 0 - - - -  Feeling bad or failure about yourself  0 - - - -  Trouble concentrating 0 - - - -  Moving slowly or fidgety/restless 0 - - - -  Suicidal thoughts 0 - - - -  PHQ-9 Score 5 - - - -  Difficult doing work/chores Not difficult at all - - - -    Quality of Life:     Quality of Life - 03/28/15 1519    Quality of Life Scores   Health/Function Pre 28.8 %   Socioeconomic Pre 30 %   Psych/Spiritual Pre 30 %   Family Pre 30 %   GLOBAL Pre 29.45 %      Personal Goals: Goals established at orientation with interventions provided to work toward goal.     Personal Goals and Risk Factors at Admission - 03/28/15 1515    Personal Goals and Risk Factors on Admission   Increase Aerobic  Exercise and Physical Activity Yes   Intervention While in program, learn and follow the exercise prescription taught. Start at a low level workload and increase workload after able to maintain previous level for 30 minutes. Increase time before increasing intensity.   Diabetes No   Hypertension Yes   Goal Participant will see blood pressure controlled within the values of 140/82m/Hg or within value directed by their physician.   Intervention Provide nutrition & aerobic exercise along with prescribed medications to achieve BP 140/90 or less.   Lipids Yes   Goal Cholesterol controlled with medications as prescribed, with individualized exercise RX and with personalized nutrition plan. Value goals: LDL < '70mg'$ , HDL > '40mg'$ . Participant states understanding of desired cholesterol values and following prescriptions.   Intervention Provide nutrition & aerobic exercise along with prescribed medications to achieve LDL '70mg'$ , HDL >'40mg'$ .   Stress No       Personal Goals Discharge:   Nutrition & Weight - Outcomes:     Pre Biometrics - 03/28/15 1423    Pre Biometrics   Height 5' 8.5" (1.74 m)   Weight 194 lb 6.4 oz (88.179 kg)   Waist Circumference 40.5 inches   Hip Circumference 37.75 inches   Waist to Hip Ratio 1.07 %   BMI (Calculated) 29.2       Nutrition:   Nutrition Discharge:   Education Questionnaire Score:     Knowledge Questionnaire Score - 03/28/15 1510    Knowledge Questionnaire Score   Pre Score 19/28       Comments: Patient came to the Cardiac Rehab initial orientation on October 19th, 2016.  He attended his first session on October 31st, 2016.  The following day he left a message stating he would not be returning to Cardiac Rehab.  He stated, "I'm just going to do it on my own!"  Patient is walking 2 miles per  day at home.

## 2015-04-18 ENCOUNTER — Other Ambulatory Visit: Payer: Self-pay | Admitting: *Deleted

## 2015-04-18 DIAGNOSIS — Z951 Presence of aortocoronary bypass graft: Secondary | ICD-10-CM

## 2015-05-01 ENCOUNTER — Ambulatory Visit: Payer: PPO | Admitting: Internal Medicine

## 2015-05-10 ENCOUNTER — Ambulatory Visit (INDEPENDENT_AMBULATORY_CARE_PROVIDER_SITE_OTHER): Payer: PPO | Admitting: Internal Medicine

## 2015-05-10 ENCOUNTER — Encounter: Payer: Self-pay | Admitting: Internal Medicine

## 2015-05-10 VITALS — BP 105/63 | HR 76 | Temp 98.8°F | Wt 201.0 lb

## 2015-05-10 DIAGNOSIS — T814XXD Infection following a procedure, subsequent encounter: Secondary | ICD-10-CM

## 2015-05-10 DIAGNOSIS — IMO0001 Reserved for inherently not codable concepts without codable children: Secondary | ICD-10-CM

## 2015-05-10 NOTE — Progress Notes (Signed)
[facility] for Infectious Disease  Patient Active Problem List   Diagnosis Date Noted  . Infection due to Enterobacter aerogenes 02/02/2015    Priority: High  . Postoperative wound infection 02/02/2015    Priority: High  . Status post right foot surgery 02/02/2015    Priority: Medium  . 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    Priority: Medium  . Paroxysmal atrial fibrillation ([place]) 03/27/2015  . Acute respiratory failure with hypoxia ([place]) 01/29/2015  . Delirium of mixed origin 01/29/2015  . History of coronary artery stent placement   . OSA (obstructive sleep apnea) 01/13/2015  . Coronary artery disease involving native coronary artery of native heart with angina pectoris with documented spasm ([place])   . Hyperlipidemia   . Essential hypertension   . Chest pain 01/05/2015  . H/O surgical procedure 01/01/2015  . Hepatic steatosis 11/24/2014  . Unexplained night sweats 11/02/2014  . Angina pectoris ([place]) 08/11/2014  . PAD (peripheral artery disease) ([place]) 04/11/2014  . Chronic daily headache 04/11/2014  . Chronic fatigue and malaise 01/11/2014  . Inflammation of toe 05/19/2013  . H/O arthroplasty 05/19/2013  . Screening for prostate cancer 02/27/2012  . Medicare annual wellness visit, subsequent 02/27/2012  . Erectile dysfunction 02/27/2012  . HLD (hyperlipidemia)   . HTN (hypertension)   . Dysphagia 12/24/2011  . Pulmonary nodule 12/24/2011  . HYPERLIPIDEMIA-MIXED 03/12/2010  . OLD MYOCARDIAL INFARCTION 03/08/2009  . CAD, NATIVE VESSEL 03/08/2009    Patient's Medications  New Prescriptions   No medications on file  Previous Medications   ASPIRIN 81 MG EC TABLET    Take 81 mg by mouth daily.     CALCIUM-VITAMIN D (RA CALCIUM PLUS VITAMIN D) 600-125 MG-UNIT TABS    Take 1 tablet by mouth 2 (two) times daily.    LISINOPRIL (PRINIVIL,ZESTRIL) 10 MG TABLET    Take 1 tablet (10 mg total) by mouth daily.   METOPROLOL SUCCINATE (TOPROL XL) 25 MG 24 HR TABLET    Take 1  tablet (25 mg total) by mouth daily.   PROBIOTIC PRODUCT (PROBIOTIC PO)    Take 2 tablets by mouth daily. accuflora   SIMVASTATIN (ZOCOR) 40 MG TABLET    Take 1 tablet (40 mg total) by mouth at bedtime.   TDAP (BOOSTRIX) 5-2.5-18.5 LF-MCG/0.5 INJECTION    Inject 0.5 mLs into the muscle once.   TRAMADOL (ULTRAM) 50 MG TABLET    Take 1 tablet (50 mg total) by mouth every 6 (six) hours as needed.   VITAMIN B-12 (CYANOCOBALAMIN) 1000 MCG TABLET    Take 1,000 mcg by mouth daily.   VITAMIN C (ASCORBIC ACID) 500 MG TABLET    Take 500 mg by mouth daily.   ZINC 100 MG TABS    Take 1 tablet by mouth daily.     ZOLPIDEM (AMBIEN) 10 MG TABLET    Take 1 tablet (10 mg total) by mouth at bedtime as needed for sleep.  Modified Medications   No medications on file  Discontinued Medications   No medications on file    Subjective:  Randall Ali is in for his routine follow-up visit. He completed IV ceftriaxone 4 postoperative sternal wound infection with Enterobacter on 03/16/2015.  He continues to have some soreness around his wound but otherwise is doing better. He has not had any fever, chills, sweats or diarrhea.  Review of Systems: Review of Systems  Constitutional: Negative for fever, chills and diaphoresis.  Gastrointestinal: Negative for diarrhea.  Past Medical History  Diagnosis Date  . CAD (coronary artery disease)     s/p inferior wall infarct in 10/01. has stent in Rt coronary artery. is due a stress myoview. does have some dyspnea on exertion  . SOB (shortness of breath) on exertion   . Inferior myocardial infarction ([place]) 10/01    stent RCA  . Chest pain   . HLD (hyperlipidemia)     isdue followup lipids. on zocor 10 mg/day   . HTN (hypertension)   . Allergy     seasonal  . Cataract     removed  . Reflux esophagitis   . OSA (obstructive sleep apnea) 01/13/2015  . Erectile dysfunction   . Arthritis     all over- in general   . Cancer ([place])     skin, melanoma  . GERD  (gastroesophageal reflux disease)     pt. denies     Social History  Substance Use Topics  . Smoking status: Former Smoker -- 2.50 packs/day for 40 years    Types: Cigarettes    Quit date: 03/24/2000  . Smokeless tobacco: Never Used  . Alcohol Use: No     Comment: occasional- 2 times per week     Family History  Problem Relation Age of Onset  . Hypertension Mother   . Heart disease Mother   . Hypertension Father   . Diabetes Father   . Cancer Paternal Grandfather   . Heart disease Brother 50  . Colon cancer Neg Hx     No Known Allergies  Objective: Filed Vitals:   05/10/15 1504  BP: 105/63  Pulse: 76  Temp: 98.8 F (37.1 C)  TempSrc: Oral  Weight: 201 lb (91.173 kg)   Body mass index is 29.67 kg/(m^2).  Physical Exam  Constitutional:  He is well dressed and in good spirits.  Pulmonary/Chest:  His surgical incisions have healed nicely and there is no evidence of any active infection.    Lab Results    Problem List Items Addressed This Visit      High   Postoperative wound infection - Primary    His infection has been cured. He can follow up here as needed.          Michel Bickers, MD [facility] [phone] pager   [phone] cell 05/10/2015, 3:24 PM

## 2015-05-10 NOTE — Assessment & Plan Note (Signed)
His infection has been cured. He can follow up here as needed.

## 2015-05-11 ENCOUNTER — Other Ambulatory Visit: Payer: Self-pay | Admitting: *Deleted

## 2015-05-11 NOTE — Patient Outreach (Signed)
Telephone call to follow up on pt status. Pt reports he has been discharged from the cardiovascular surgeon and the infectious disease MD. He is going to the gyn 4-5 times a week and has even gone back to work! He says he almost feels like he did before this problem occurred.  I reinforced his healthy heart life style. I have encouraged him to call me if he has any problems in the future. I have advised him that this will be my last call.  Deloria Lair [facility] [phone]

## 2015-05-15 ENCOUNTER — Telehealth: Payer: Self-pay | Admitting: Internal Medicine

## 2015-05-15 DIAGNOSIS — L089 Local infection of the skin and subcutaneous tissue, unspecified: Secondary | ICD-10-CM

## 2015-05-15 NOTE — Telephone Encounter (Signed)
Pt lvm stating that he needs Dr. Derrel Nip to refer him to a podiatrist at [facility]. Please advise.

## 2015-05-16 NOTE — Telephone Encounter (Signed)
Please advise 

## 2015-05-16 NOTE — Telephone Encounter (Signed)
Your referral is in process as requested. Our referral coordinator will call you when the appointment has been made.  

## 2015-06-10 DIAGNOSIS — G4730 Sleep apnea, unspecified: Secondary | ICD-10-CM

## 2015-06-10 HISTORY — DX: Sleep apnea, unspecified: G47.30

## 2015-06-14 DIAGNOSIS — M9903 Segmental and somatic dysfunction of lumbar region: Secondary | ICD-10-CM | POA: Diagnosis not present

## 2015-06-14 DIAGNOSIS — M5033 Other cervical disc degeneration, cervicothoracic region: Secondary | ICD-10-CM | POA: Diagnosis not present

## 2015-06-14 DIAGNOSIS — M5432 Sciatica, left side: Secondary | ICD-10-CM | POA: Diagnosis not present

## 2015-06-14 DIAGNOSIS — M9901 Segmental and somatic dysfunction of cervical region: Secondary | ICD-10-CM | POA: Diagnosis not present

## 2015-06-18 DIAGNOSIS — L089 Local infection of the skin and subcutaneous tissue, unspecified: Secondary | ICD-10-CM | POA: Diagnosis not present

## 2015-06-18 DIAGNOSIS — S90829A Blister (nonthermal), unspecified foot, initial encounter: Secondary | ICD-10-CM | POA: Diagnosis not present

## 2015-06-18 DIAGNOSIS — M7741 Metatarsalgia, right foot: Secondary | ICD-10-CM | POA: Diagnosis not present

## 2015-06-25 DIAGNOSIS — H11121 Conjunctival concretions, right eye: Secondary | ICD-10-CM | POA: Diagnosis not present

## 2015-07-05 DIAGNOSIS — S92521A Displaced fracture of medial phalanx of right lesser toe(s), initial encounter for closed fracture: Secondary | ICD-10-CM | POA: Diagnosis not present

## 2015-07-11 ENCOUNTER — Telehealth: Payer: Self-pay | Admitting: Internal Medicine

## 2015-07-11 NOTE — Telephone Encounter (Signed)
Pt called about needing a CBC blood work. Need orders please and thank you! Call pt @ [phone].

## 2015-07-11 NOTE — Telephone Encounter (Signed)
The labs have been ordered since nov 4th and are visible in the chart .  Do you know how to check to see if they have been ordered when a patient calls with a request?

## 2015-07-11 NOTE — Telephone Encounter (Signed)
Pt wants CBC labs ordered. Please advise

## 2015-07-30 ENCOUNTER — Telehealth: Payer: Self-pay

## 2015-07-30 NOTE — Telephone Encounter (Signed)
Tell him to take his toprol at night and his lisinopril in the mornign and make sure he is drinking G2 while he is exercising

## 2015-07-30 NOTE — Telephone Encounter (Signed)
Reason for call: drop in bp Symptoms: pt states that his bp dropped this morning at 10:00am to 87/44, pt says he feels a little dizzy Duration: this morning Medications: toprol, aspirin, lisinopril, tramadol Last seen for this problem: no Seen by:  Please advise, thanks

## 2015-07-30 NOTE — Telephone Encounter (Signed)
When was his last dose of toprol and lisinopril? What was he doing at the time he felt dizzy? Has he been having excessive urination or loose stools?

## 2015-07-30 NOTE — Telephone Encounter (Signed)
Pt states that he took his lisinopril and toprol at 7am this morning, he went to the gym and when he got home he was sitting down watching tv when he started feeling dizzy. He states that he is not urinating excessively and no loose stools

## 2015-07-30 NOTE — Telephone Encounter (Signed)
LM for patient to return call.

## 2015-07-30 NOTE — Telephone Encounter (Signed)
Pt states that he feels pretty good now, pt states that his bp is currently 132/60. Pt states that he is home by his self.

## 2015-07-31 NOTE — Telephone Encounter (Signed)
Notified pt of Dr.Tullo's comments, pt verbalized understanding

## 2015-08-08 DIAGNOSIS — M9903 Segmental and somatic dysfunction of lumbar region: Secondary | ICD-10-CM | POA: Diagnosis not present

## 2015-08-08 DIAGNOSIS — M9901 Segmental and somatic dysfunction of cervical region: Secondary | ICD-10-CM | POA: Diagnosis not present

## 2015-08-08 DIAGNOSIS — M5033 Other cervical disc degeneration, cervicothoracic region: Secondary | ICD-10-CM | POA: Diagnosis not present

## 2015-08-08 DIAGNOSIS — M5432 Sciatica, left side: Secondary | ICD-10-CM | POA: Diagnosis not present

## 2015-08-11 DIAGNOSIS — M5033 Other cervical disc degeneration, cervicothoracic region: Secondary | ICD-10-CM | POA: Diagnosis not present

## 2015-08-11 DIAGNOSIS — M9903 Segmental and somatic dysfunction of lumbar region: Secondary | ICD-10-CM | POA: Diagnosis not present

## 2015-08-11 DIAGNOSIS — M5432 Sciatica, left side: Secondary | ICD-10-CM | POA: Diagnosis not present

## 2015-08-11 DIAGNOSIS — M9901 Segmental and somatic dysfunction of cervical region: Secondary | ICD-10-CM | POA: Diagnosis not present

## 2015-08-15 ENCOUNTER — Other Ambulatory Visit: Payer: PPO

## 2015-08-17 ENCOUNTER — Other Ambulatory Visit (INDEPENDENT_AMBULATORY_CARE_PROVIDER_SITE_OTHER): Payer: PPO

## 2015-08-17 DIAGNOSIS — Z79899 Other long term (current) drug therapy: Secondary | ICD-10-CM

## 2015-08-17 DIAGNOSIS — E785 Hyperlipidemia, unspecified: Secondary | ICD-10-CM | POA: Diagnosis not present

## 2015-08-17 LAB — LIPID PANEL
Cholesterol: 121 mg/dL (ref 0–200)
HDL: 48 mg/dL (ref >39.00)
LDL Cholesterol: 62 mg/dL (ref 0–99)
NonHDL: 72.94
Total CHOL/HDL Ratio: 3
Triglycerides: 53 mg/dL (ref 0.0–149.0)
VLDL: 10.6 mg/dL (ref 0.0–40.0)

## 2015-08-17 LAB — COMPREHENSIVE METABOLIC PANEL
ALT: 20 U/L (ref 0–53)
AST: 16 U/L (ref 0–37)
Albumin: 4.4 g/dL (ref 3.5–5.2)
Alkaline Phosphatase: 60 U/L (ref 39–117)
BUN: 18 mg/dL (ref 6–23)
CO2: 27 mEq/L (ref 19–32)
Calcium: 9.5 mg/dL (ref 8.4–10.5)
Chloride: 106 mEq/L (ref 96–112)
Creatinine, Ser: 1.11 mg/dL (ref 0.40–1.50)
GFR: 68.93 mL/min (ref >60.00)
Glucose, Bld: 92 mg/dL (ref 70–99)
Potassium: 4.9 mEq/L (ref 3.5–5.1)
Sodium: 140 mEq/L (ref 135–145)
Total Bilirubin: 0.7 mg/dL (ref 0.2–1.2)
Total Protein: 7 g/dL (ref 6.0–8.3)

## 2015-08-17 LAB — CBC WITH DIFFERENTIAL/PLATELET
Basophils Absolute: 0 10*3/uL (ref 0.0–0.1)
Basophils Relative: 0.7 % (ref 0.0–3.0)
Eosinophils Absolute: 0.1 10*3/uL (ref 0.0–0.7)
Eosinophils Relative: 2.1 % (ref 0.0–5.0)
HCT: 43.1 % (ref 39.0–52.0)
Hemoglobin: 14.4 g/dL (ref 13.0–17.0)
Lymphocytes Relative: 30.4 % (ref 12.0–46.0)
Lymphs Abs: 1.7 10*3/uL (ref 0.7–4.0)
MCHC: 33.5 g/dL (ref 30.0–36.0)
MCV: 89.5 fl (ref 78.0–100.0)
Monocytes Absolute: 0.6 10*3/uL (ref 0.1–1.0)
Monocytes Relative: 9.7 % (ref 3.0–12.0)
Neutro Abs: 3.3 10*3/uL (ref 1.4–7.7)
Neutrophils Relative %: 57.1 % (ref 43.0–77.0)
Platelets: 194 10*3/uL (ref 150.0–400.0)
RBC: 4.81 Mil/uL (ref 4.22–5.81)
RDW: 13.8 % (ref 11.5–15.5)
WBC: 5.7 10*3/uL (ref 4.0–10.5)

## 2015-08-17 LAB — LDL CHOLESTEROL, DIRECT: Direct LDL: 63 mg/dL

## 2015-08-20 ENCOUNTER — Encounter: Payer: Self-pay | Admitting: *Deleted

## 2015-08-20 DIAGNOSIS — M79671 Pain in right foot: Secondary | ICD-10-CM | POA: Diagnosis not present

## 2015-08-20 DIAGNOSIS — Z981 Arthrodesis status: Secondary | ICD-10-CM | POA: Diagnosis not present

## 2015-08-20 DIAGNOSIS — S92521A Displaced fracture of medial phalanx of right lesser toe(s), initial encounter for closed fracture: Secondary | ICD-10-CM | POA: Diagnosis not present

## 2015-08-27 DIAGNOSIS — R51 Headache: Secondary | ICD-10-CM | POA: Diagnosis not present

## 2015-08-27 DIAGNOSIS — J309 Allergic rhinitis, unspecified: Secondary | ICD-10-CM | POA: Diagnosis not present

## 2015-09-03 DIAGNOSIS — M5432 Sciatica, left side: Secondary | ICD-10-CM | POA: Diagnosis not present

## 2015-09-03 DIAGNOSIS — M9903 Segmental and somatic dysfunction of lumbar region: Secondary | ICD-10-CM | POA: Diagnosis not present

## 2015-09-03 DIAGNOSIS — M5033 Other cervical disc degeneration, cervicothoracic region: Secondary | ICD-10-CM | POA: Diagnosis not present

## 2015-09-03 DIAGNOSIS — M9901 Segmental and somatic dysfunction of cervical region: Secondary | ICD-10-CM | POA: Diagnosis not present

## 2015-09-05 DIAGNOSIS — M9901 Segmental and somatic dysfunction of cervical region: Secondary | ICD-10-CM | POA: Diagnosis not present

## 2015-09-05 DIAGNOSIS — M9903 Segmental and somatic dysfunction of lumbar region: Secondary | ICD-10-CM | POA: Diagnosis not present

## 2015-09-05 DIAGNOSIS — M5033 Other cervical disc degeneration, cervicothoracic region: Secondary | ICD-10-CM | POA: Diagnosis not present

## 2015-09-05 DIAGNOSIS — M5432 Sciatica, left side: Secondary | ICD-10-CM | POA: Diagnosis not present

## 2015-09-08 DIAGNOSIS — M9903 Segmental and somatic dysfunction of lumbar region: Secondary | ICD-10-CM | POA: Diagnosis not present

## 2015-09-08 DIAGNOSIS — M9901 Segmental and somatic dysfunction of cervical region: Secondary | ICD-10-CM | POA: Diagnosis not present

## 2015-09-08 DIAGNOSIS — M5432 Sciatica, left side: Secondary | ICD-10-CM | POA: Diagnosis not present

## 2015-09-08 DIAGNOSIS — M5033 Other cervical disc degeneration, cervicothoracic region: Secondary | ICD-10-CM | POA: Diagnosis not present

## 2015-09-24 ENCOUNTER — Ambulatory Visit (INDEPENDENT_AMBULATORY_CARE_PROVIDER_SITE_OTHER): Payer: PPO | Admitting: Cardiovascular Disease

## 2015-09-24 ENCOUNTER — Encounter: Payer: Self-pay | Admitting: Cardiovascular Disease

## 2015-09-24 VITALS — BP 124/70 | HR 68 | Ht 68.0 in | Wt 204.8 lb

## 2015-09-24 DIAGNOSIS — I480 Paroxysmal atrial fibrillation: Secondary | ICD-10-CM

## 2015-09-24 DIAGNOSIS — Z951 Presence of aortocoronary bypass graft: Secondary | ICD-10-CM

## 2015-09-24 DIAGNOSIS — I739 Peripheral vascular disease, unspecified: Secondary | ICD-10-CM | POA: Diagnosis not present

## 2015-09-24 DIAGNOSIS — E785 Hyperlipidemia, unspecified: Secondary | ICD-10-CM

## 2015-09-24 DIAGNOSIS — I25111 Atherosclerotic heart disease of native coronary artery with angina pectoris with documented spasm: Secondary | ICD-10-CM

## 2015-09-24 NOTE — Assessment & Plan Note (Signed)
Cholesterol is at goal on the current lipid regimen. No changes to the medications were made.  

## 2015-09-24 NOTE — Assessment & Plan Note (Signed)
Currently with no symptoms of angina. No further workup at this time. Continue current medication regimen. 

## 2015-09-24 NOTE — Patient Instructions (Signed)
You are doing well. No medication changes were made.  Please call us if you have new issues that need to be addressed before your next appt.  Your physician wants you to follow-up in: 6 months.  You will receive a reminder letter in the mail two months in advance. If you don't receive a letter, please call our office to schedule the follow-up appointment.   

## 2015-09-24 NOTE — Assessment & Plan Note (Signed)
Long discussion with him concerning adding additional antiplatelet medication such as Plavix. He is not interested at this time, has bruising on aspirin alone

## 2015-09-24 NOTE — Assessment & Plan Note (Signed)
Maintaining normal sinus rhythm, We'll continue metoprolol

## 2015-09-24 NOTE — Progress Notes (Signed)
Patient ID: Randall Feasel., male    DOB: 1940/09/12, 75 y.o.   MRN: [id number]  HPI Comments: Randall Ali is a very pleasant 75 year old gentleman with a history of coronary artery disease,history of an inferior wall MI 2001 with stent to the RCA,   hypertension, and hyperlipidemia. Previous admit in 2011 for for mild chest pain and sweating. Stress test showed no ischemia with inferior scar. Previous cardiac catheterization showing moderate OM disease.  Repeat catheterization  in August 2016 showing severe three-vessel disease, sent for CABG on 01/24/2015.  He presents today for follow-up of his coronary artery disease  In follow-up, he reports that he is active, playing softball If he overdoes it, has sore legs. Has some tenderness to the left of the mediastinum which she attributes to the bypass surgery. Pain has persisted, tender on palpation.  Several months ago reports having labile blood pressures, sometimes very low. Moved the Toprol to the evening, seems to have helped  Lab work reviewed showing total cholesterol 120  EKG on today's visit shows normal sinus rhythm with rate 64 bpm, old inferior MI  Other past medical history reviewed postoperative atrial fibrillation,  postoperative delirium and began having fevers on 01/29/2015.  some drainage from the inferior portion of his sternal wound. Blood cultures and wound cultures grew Enterobacter aerogenes. He was discharged on IV ceftriaxone  and completed 6 weeks of therapy . He took probiotics during this time  In follow-up, he reports that he is doing well. He is walking 2 blocks per day He does not check his blood pressure at home. Recent cholesterol shows LDL in the 80s Denies any shortness of breath on exertion He does have chest pain when he coughs  EKG on today's visit shows normal sinus rhythm with rate 61 bpm, no significant ST or T-wave changes  Other past medical history  t right foot surgery in May 2016. repeat  hardware fusion of his right great toe and had hammertoe repair of his right second toe.   postoperative infection with persistent drainage from the second toe. A culture in late July grew MSSA. course of doxycycline.  The drainage stopped but he has continued to have redness and swelling  He still has some pain when walking   Other past medical history   In 2013, he was having worsening chest pain medically with exertion.  cardiac catheterization that showed moderate obtuse marginal disease, otherwise no significant stenosis. This was discussed with the interventional physicians in [facility] and recommendation was made for medical management.   previous Total cholesterol 140, LDL 78 prior EGD  that showed an ulcer. Previously had trouble swallowing.    No Known Allergies  Outpatient Encounter Prescriptions as of 09/24/2015  Medication Sig  . aspirin 81 MG EC tablet Take 81 mg by mouth daily.    . Calcium-Vitamin D (RA CALCIUM PLUS VITAMIN D) 600-125 MG-UNIT TABS Take 1 tablet by mouth 2 (two) times daily.   [name] lisinopril (PRINIVIL,ZESTRIL) 10 MG tablet Take 1 tablet (10 mg total) by mouth daily.  . metoprolol succinate (TOPROL XL) 25 MG 24 hr tablet Take 1 tablet (25 mg total) by mouth daily.  . simvastatin (ZOCOR) 40 MG tablet Take 1 tablet (40 mg total) by mouth at bedtime.  . traMADol (ULTRAM) 50 MG tablet Take 1 tablet (50 mg total) by mouth every 6 (six) hours as needed. (Patient taking differently: Take 50 mg by mouth every 6 (six) hours as needed for moderate pain. )  .  vitamin B-12 (CYANOCOBALAMIN) 1000 MCG tablet Take 1,000 mcg by mouth daily.  . vitamin C (ASCORBIC ACID) 500 MG tablet Take 500 mg by mouth daily.  . Zinc 100 MG TABS Take 1 tablet by mouth daily.    [name] zolpidem (AMBIEN) 5 MG tablet Take 5 mg by mouth at bedtime as needed for sleep.  . [DISCONTINUED] Probiotic Product (PROBIOTIC PO) Take 2 tablets by mouth daily. accuflora  . [DISCONTINUED] Tdap (BOOSTRIX)  5-2.5-18.5 LF-MCG/0.5 injection Inject 0.5 mLs into the muscle once.   No facility-administered encounter medications on file as of 09/24/2015.    Past Medical History  Diagnosis Date  . CAD (coronary artery disease)     s/p inferior wall infarct in 10/01. has stent in Rt coronary artery. is due a stress myoview. does have some dyspnea on exertion  . SOB (shortness of breath) on exertion   . Inferior myocardial infarction ([place]) 10/01    stent RCA  . Chest pain   . HLD (hyperlipidemia)     isdue followup lipids. on zocor 10 mg/day   . HTN (hypertension)   . Allergy     seasonal  . Cataract     removed  . Reflux esophagitis   . OSA (obstructive sleep apnea) 01/13/2015  . Erectile dysfunction   . Arthritis     all over- in general   . Cancer ([place])     skin, melanoma  . GERD (gastroesophageal reflux disease)     pt. denies     Past Surgical History  Procedure Laterality Date  . Cardiac catheterization  06/24/11  . Carotid stent  03/10/2011  . Arm surgery  2010  . Shoulder arthroscopy  2012  . Wrist surgery  2011  . Toe surgery  1994  . Nasal sinus surgery  2008    septpolasty, bilateral turbinate reduction  . Hammer toe surgery      right toe  . Colonoscopy  2010  . Cardiac catheterization N/A 01/18/2015    Procedure: Left Heart Cath with coronary angiography;  Surgeon: Minna Merritts, MD;  Location: [place] [facility];  Service: Cardiovascular;  Laterality: N/A;  . Cardiac catheterization N/A 01/18/2015    Procedure: Intravascular Pressure Wire/FFR Study;  Surgeon: [name], MD;  Location: [place] [facility];  Service: Cardiovascular;  Laterality: N/A;  . Eye surgery      lasik 15 yrs. ago, cataracts removed - both eyes   . Coronary artery bypass graft N/A 01/24/2015    Procedure: CORONARY ARTERY BYPASS GRAFTING x 4 (LIMA-LAD, SVG-Int 1- Int 2, SVG-PD) ENDOSCOPIC GREATER SAPHENOUS VEIN HARVEST LEFT LEG;  Surgeon: [name], MD;  Location: [place];   Service: Open Heart Surgery;  Laterality: N/A;  . Tee without cardioversion N/A 01/24/2015    Procedure: TRANSESOPHAGEAL ECHOCARDIOGRAM (TEE);  Surgeon: [name], MD;  Location: [place];  Service: Open Heart Surgery;  Laterality: N/A;    Social History  reports that he quit smoking about 15 years ago. His smoking use included Cigarettes. He has a 100 pack-year smoking history. He has never used smokeless tobacco. He reports that he does not drink alcohol or use illicit drugs.  Family History family history includes Cancer in his paternal grandfather; Diabetes in his father; Heart disease in his mother; Heart disease (age of onset: 1) in his brother; Hypertension in his father and mother. There is no history of Colon cancer.   Review of Systems  Constitutional: Negative.   Respiratory: Negative.  Cardiovascular: Negative.   Gastrointestinal: Negative.   Musculoskeletal: Negative.   Neurological: Negative.   Hematological: Negative.   Psychiatric/Behavioral: Negative.   All other systems reviewed and are negative.  BP 124/70 mmHg  Pulse 68  Ht '5\' 8"'$  (1.727 m)  Wt 204 lb 12.8 oz (92.897 kg)  BMI 31.15 kg/m2  SpO2 95%  Physical Exam  Constitutional: He is oriented to person, place, and time. He appears well-developed and well-nourished.  HENT:  Head: Normocephalic.  Nose: Nose normal.  Mouth/Throat: Oropharynx is clear and moist.  Eyes: Conjunctivae are normal. Pupils are equal, round, and reactive to light.  Neck: Normal range of motion. Neck supple. No JVD present.  Cardiovascular: Normal rate, regular rhythm, S1 normal, S2 normal, normal heart sounds and intact distal pulses.  Exam reveals no gallop and no friction rub.   No murmur heard. Pulmonary/Chest: Effort normal and breath sounds normal. No respiratory distress. He has no wheezes. He has no rales. He exhibits no tenderness.  Abdominal: Soft. Bowel sounds are normal. He exhibits no distension. There is no  tenderness.  Musculoskeletal: Normal range of motion. He exhibits no edema or tenderness.  Lymphadenopathy:    He has no cervical adenopathy.  Neurological: He is alert and oriented to person, place, and time. Coordination normal.  Skin: Skin is warm and dry. No rash noted. No erythema.  Psychiatric: He has a normal mood and affect. His behavior is normal. Judgment and thought content normal.      Assessment and Plan   Nursing note and vitals reviewed.

## 2015-09-24 NOTE — Assessment & Plan Note (Signed)
On aspirin, statin Cholesterol at goal

## 2015-09-25 DIAGNOSIS — M5033 Other cervical disc degeneration, cervicothoracic region: Secondary | ICD-10-CM | POA: Diagnosis not present

## 2015-09-25 DIAGNOSIS — M9903 Segmental and somatic dysfunction of lumbar region: Secondary | ICD-10-CM | POA: Diagnosis not present

## 2015-09-25 DIAGNOSIS — M5432 Sciatica, left side: Secondary | ICD-10-CM | POA: Diagnosis not present

## 2015-09-25 DIAGNOSIS — M9901 Segmental and somatic dysfunction of cervical region: Secondary | ICD-10-CM | POA: Diagnosis not present

## 2015-09-26 DIAGNOSIS — C44319 Basal cell carcinoma of skin of other parts of face: Secondary | ICD-10-CM | POA: Diagnosis not present

## 2015-09-26 DIAGNOSIS — D485 Neoplasm of uncertain behavior of skin: Secondary | ICD-10-CM | POA: Diagnosis not present

## 2015-09-26 DIAGNOSIS — D0461 Carcinoma in situ of skin of right upper limb, including shoulder: Secondary | ICD-10-CM | POA: Diagnosis not present

## 2015-09-26 DIAGNOSIS — Z08 Encounter for follow-up examination after completed treatment for malignant neoplasm: Secondary | ICD-10-CM | POA: Diagnosis not present

## 2015-09-26 DIAGNOSIS — Z85820 Personal history of malignant melanoma of skin: Secondary | ICD-10-CM | POA: Diagnosis not present

## 2015-09-26 DIAGNOSIS — X32XXXA Exposure to sunlight, initial encounter: Secondary | ICD-10-CM | POA: Diagnosis not present

## 2015-09-26 DIAGNOSIS — Z85828 Personal history of other malignant neoplasm of skin: Secondary | ICD-10-CM | POA: Diagnosis not present

## 2015-09-26 DIAGNOSIS — D0462 Carcinoma in situ of skin of left upper limb, including shoulder: Secondary | ICD-10-CM | POA: Diagnosis not present

## 2015-09-26 DIAGNOSIS — L570 Actinic keratosis: Secondary | ICD-10-CM | POA: Diagnosis not present

## 2015-09-26 DIAGNOSIS — Z872 Personal history of diseases of the skin and subcutaneous tissue: Secondary | ICD-10-CM | POA: Diagnosis not present

## 2015-09-27 DIAGNOSIS — M9903 Segmental and somatic dysfunction of lumbar region: Secondary | ICD-10-CM | POA: Diagnosis not present

## 2015-09-27 DIAGNOSIS — M5432 Sciatica, left side: Secondary | ICD-10-CM | POA: Diagnosis not present

## 2015-09-27 DIAGNOSIS — M9901 Segmental and somatic dysfunction of cervical region: Secondary | ICD-10-CM | POA: Diagnosis not present

## 2015-09-27 DIAGNOSIS — M5033 Other cervical disc degeneration, cervicothoracic region: Secondary | ICD-10-CM | POA: Diagnosis not present

## 2015-10-02 DIAGNOSIS — M25552 Pain in left hip: Secondary | ICD-10-CM | POA: Diagnosis not present

## 2015-10-02 DIAGNOSIS — M545 Low back pain: Secondary | ICD-10-CM | POA: Diagnosis not present

## 2015-10-08 ENCOUNTER — Ambulatory Visit (INDEPENDENT_AMBULATORY_CARE_PROVIDER_SITE_OTHER): Payer: PPO | Admitting: Internal Medicine

## 2015-10-08 ENCOUNTER — Encounter: Payer: Self-pay | Admitting: Internal Medicine

## 2015-10-08 VITALS — BP 118/68 | HR 63 | Temp 97.7°F | Resp 12 | Ht 68.0 in | Wt 198.5 lb

## 2015-10-08 DIAGNOSIS — G4733 Obstructive sleep apnea (adult) (pediatric): Secondary | ICD-10-CM

## 2015-10-08 DIAGNOSIS — M9903 Segmental and somatic dysfunction of lumbar region: Secondary | ICD-10-CM | POA: Diagnosis not present

## 2015-10-08 DIAGNOSIS — M9901 Segmental and somatic dysfunction of cervical region: Secondary | ICD-10-CM | POA: Diagnosis not present

## 2015-10-08 DIAGNOSIS — M5033 Other cervical disc degeneration, cervicothoracic region: Secondary | ICD-10-CM | POA: Diagnosis not present

## 2015-10-08 DIAGNOSIS — I10 Essential (primary) hypertension: Secondary | ICD-10-CM | POA: Diagnosis not present

## 2015-10-08 DIAGNOSIS — M5432 Sciatica, left side: Secondary | ICD-10-CM | POA: Diagnosis not present

## 2015-10-08 MED ORDER — TRAMADOL HCL 50 MG PO TABS: 50.0000 mg | ORAL_TABLET | Freq: Four times a day (QID) | ORAL | Status: DC | PRN

## 2015-10-08 MED ORDER — ZOLPIDEM TARTRATE 5 MG PO TABS: 5.0000 mg | ORAL_TABLET | Freq: Every evening | ORAL | Status: DC | PRN

## 2015-10-08 NOTE — Progress Notes (Signed)
Pre-visit discussion using our clinic review tool. No additional management support is needed unless otherwise documented below in the visit note.  

## 2015-10-08 NOTE — Patient Instructions (Signed)
   I will reschedule your sleep study  MAKE SURE YOU TAKE THE ZOLPIDEM(SLEEPING PILL) WITH YOU!!  Return in 6 months for your annual exam

## 2015-10-09 NOTE — Assessment & Plan Note (Signed)
Mild to moderately  severe By sleep study July 2016, with 25% of study time spent with O2 sats < 90% .   Titration study was postponed per patient request until after cardiac issues were addressed.  Now that his cardiac issues have resolved,  Will schedule his titration study.

## 2015-10-09 NOTE — Assessment & Plan Note (Signed)
Well controlled on current regimen. Renal function stable, no changes today.  Lab Results  Component Value Date   CREATININE 1.11 08/17/2015   Lab Results  Component Value Date   NA 140 08/17/2015   K 4.9 08/17/2015   CL 106 08/17/2015   CO2 27 08/17/2015

## 2015-10-09 NOTE — Progress Notes (Signed)
Subjective:  Patient ID: Randall Kung., male    DOB: 08/08/1940  Age: 75 y.o. MRN: [id number]  CC: The primary encounter diagnosis was Essential hypertension. A diagnosis of OSA (obstructive sleep apnea) was also pertinent to this visit.  HPI Randall Ali. presents for follow up on hypertension and hyperlipidemia.[name]  He feels generally well and is tolerating his medications without side effects. He has been working out regularly at [employer]  And is losing weight steadily. He continues to endorse morning fatigue and has had an abnormal sleep study but has not been set up for the titration study.    Still having right foot pain, now seeing [facility] Lennox Grumbles, who noted a closed displaced fracture of the middle phalanx of the the lesser toe of right foot and offered future surgical intervention with osteotomy and hammertoe corrections with pinning. Patient has deferred for now.    Outpatient Prescriptions Prior to Visit  Medication Sig Dispense Refill  . aspirin 81 MG EC tablet Take 81 mg by mouth daily.      . Calcium-Vitamin D (RA CALCIUM PLUS VITAMIN D) 600-125 MG-UNIT TABS Take 1 tablet by mouth 2 (two) times daily.     [name] lisinopril (PRINIVIL,ZESTRIL) 10 MG tablet Take 1 tablet (10 mg total) by mouth daily. 90 tablet 3  . metoprolol succinate (TOPROL XL) 25 MG 24 hr tablet Take 1 tablet (25 mg total) by mouth daily. 90 tablet 4  . simvastatin (ZOCOR) 40 MG tablet Take 1 tablet (40 mg total) by mouth at bedtime. 90 tablet 3  . vitamin B-12 (CYANOCOBALAMIN) 1000 MCG tablet Take 1,000 mcg by mouth daily.    . vitamin C (ASCORBIC ACID) 500 MG tablet Take 500 mg by mouth daily.    . Zinc 100 MG TABS Take 1 tablet by mouth daily.      . traMADol (ULTRAM) 50 MG tablet Take 1 tablet (50 mg total) by mouth every 6 (six) hours as needed. (Patient taking differently: Take 50 mg by mouth every 6 (six) hours as needed for moderate pain. ) 60 tablet 5  . zolpidem (AMBIEN) 5 MG  tablet Take 5 mg by mouth at bedtime as needed for sleep.     No facility-administered medications prior to visit.    Review of Systems;  Patient denies headache, fevers, malaise, unintentional weight loss, skin rash, eye pain, sinus congestion and sinus pain, sore throat, dysphagia,  hemoptysis , cough, dyspnea, wheezing, chest pain, palpitations, orthopnea, edema, abdominal pain, nausea, melena, diarrhea, constipation, flank pain, dysuria, hematuria, urinary  Frequency, nocturia, numbness, tingling, seizures,  Focal weakness, Loss of consciousness,  Tremor, insomnia, depression, anxiety, and suicidal ideation.      Objective:  BP 118/68 mmHg  Pulse 63  Temp(Src) 97.7 F (36.5 C) (Oral)  Resp 12  Ht '5\' 8"'$  (1.727 m)  Wt 198 lb 8 oz (90.039 kg)  BMI 30.19 kg/m2  SpO2 97%  BP Readings from Last 3 Encounters:  10/08/15 118/68  09/24/15 124/70  05/10/15 105/63    Wt Readings from Last 3 Encounters:  10/08/15 198 lb 8 oz (90.039 kg)  09/24/15 204 lb 12.8 oz (92.897 kg)  05/10/15 201 lb (91.173 kg)    General appearance: alert, cooperative and appears stated age Ears: normal TM's and external ear canals both ears Throat: lips, mucosa, and tongue normal; teeth and gums normal Neck: no adenopathy, no carotid bruit, supple, symmetrical, trachea midline and thyroid not enlarged, symmetric, no tenderness/mass/nodules  Back: symmetric, no curvature. ROM normal. No CVA tenderness. Lungs: clear to auscultation bilaterally Heart: regular rate and rhythm, S1, S2 normal, no murmur, click, rub or gallop Abdomen: soft, non-tender; bowel sounds normal; no masses,  no organomegaly Pulses: 2+ and symmetric Skin: Skin color, texture, turgor normal. No rashes or lesions Lymph nodes: Cervical, supraclavicular, and axillary nodes normal.  Lab Results  Component Value Date   HGBA1C 5.8* 01/22/2015    Lab Results  Component Value Date   CREATININE 1.11 08/17/2015   CREATININE 1.06 02/12/2015    CREATININE 1.45* 02/07/2015    Lab Results  Component Value Date   WBC 5.7 08/17/2015   HGB 14.4 08/17/2015   HCT 43.1 08/17/2015   PLT 194.0 08/17/2015   GLUCOSE 92 08/17/2015   CHOL 121 08/17/2015   TRIG 53.0 08/17/2015   HDL 48.00 08/17/2015   LDLDIRECT 63.0 08/17/2015   LDLCALC 62 08/17/2015   ALT 20 08/17/2015   AST 16 08/17/2015   NA 140 08/17/2015   K 4.9 08/17/2015   CL 106 08/17/2015   CREATININE 1.11 08/17/2015   BUN 18 08/17/2015   CO2 27 08/17/2015   TSH 3.318 02/02/2015   PSA 0.28 11/24/2014   INR 1.34 01/24/2015   HGBA1C 5.8* 01/22/2015    Dg Chest 2 View  04/12/2015  CLINICAL DATA:  History of CABG, shortness of breath EXAM: CHEST  2 VIEW COMPARISON:  Chest x-ray of 03/08/2015 FINDINGS: No active infiltrate or effusion is seen. Mediastinal and hilar contours are unremarkable. The heart is mildly enlarged and stable. Median sternotomy sutures are noted from prior CABG. There are degenerative changes throughout the thoracic spine. IMPRESSION: No active lung disease.  Stable mild cardiomegaly. Electronically Signed   By: Ivar Drape M.D.   On: 04/12/2015 15:09    Assessment & Plan:   Problem List Items Addressed This Visit    HTN (hypertension) - Primary    Well controlled on current regimen. Renal function stable, no changes today.  Lab Results  Component Value Date   CREATININE 1.11 08/17/2015   Lab Results  Component Value Date   NA 140 08/17/2015   K 4.9 08/17/2015   CL 106 08/17/2015   CO2 27 08/17/2015         OSA (obstructive sleep apnea)    Mild to moderately  severe By sleep study July 2016, with 25% of study time spent with O2 sats < 90% .   Titration study was postponed per patient request until after cardiac issues were addressed.  Now that his cardiac issues have resolved,  Will schedule his titration study.        Relevant Orders   Nocturnal polysomnography (NPSG)      I have discontinued [name] zolpidem. I have also  changed his zolpidem. Additionally, I am having him maintain his aspirin, Calcium-Vitamin D, Zinc, vitamin B-12, vitamin C, metoprolol succinate, simvastatin, lisinopril, and traMADol.  Meds ordered this encounter  Medications  . traMADol (ULTRAM) 50 MG tablet    Sig: Take 1 tablet (50 mg total) by mouth every 6 (six) hours as needed.    Dispense:  90 tablet    Refill:  3  . zolpidem (AMBIEN) 5 MG tablet    Sig: Take 1 tablet (5 mg total) by mouth at bedtime as needed for sleep.    Dispense:  30 tablet    Refill:  5    Medications Discontinued During This Encounter  Medication Reason  . traMADol (ULTRAM) 50  MG tablet Reorder  . zolpidem (AMBIEN) 10 MG tablet   . zolpidem (AMBIEN) 5 MG tablet Reorder    Follow-up: No Follow-up on file.   [name], MD

## 2015-10-10 DIAGNOSIS — M5033 Other cervical disc degeneration, cervicothoracic region: Secondary | ICD-10-CM | POA: Diagnosis not present

## 2015-10-10 DIAGNOSIS — M5432 Sciatica, left side: Secondary | ICD-10-CM | POA: Diagnosis not present

## 2015-10-10 DIAGNOSIS — M9903 Segmental and somatic dysfunction of lumbar region: Secondary | ICD-10-CM | POA: Diagnosis not present

## 2015-10-10 DIAGNOSIS — M9901 Segmental and somatic dysfunction of cervical region: Secondary | ICD-10-CM | POA: Diagnosis not present

## 2015-10-29 ENCOUNTER — Other Ambulatory Visit: Payer: Self-pay | Admitting: Physical Medicine and Rehabilitation

## 2015-10-29 DIAGNOSIS — M79604 Pain in right leg: Secondary | ICD-10-CM

## 2015-10-29 DIAGNOSIS — M79605 Pain in left leg: Secondary | ICD-10-CM

## 2015-10-29 DIAGNOSIS — M545 Low back pain: Secondary | ICD-10-CM

## 2015-11-02 DIAGNOSIS — H04123 Dry eye syndrome of bilateral lacrimal glands: Secondary | ICD-10-CM | POA: Diagnosis not present

## 2015-11-04 ENCOUNTER — Ambulatory Visit
Admission: RE | Admit: 2015-11-04 | Discharge: 2015-11-04 | Disposition: A | Discharge status: Home / Self Care | Payer: PPO | Source: Ambulatory Visit | Attending: Physical Medicine and Rehabilitation | Admitting: Physical Medicine and Rehabilitation

## 2015-11-04 DIAGNOSIS — M79604 Pain in right leg: Secondary | ICD-10-CM

## 2015-11-04 DIAGNOSIS — M545 Low back pain: Secondary | ICD-10-CM

## 2015-11-04 DIAGNOSIS — M4806 Spinal stenosis, lumbar region: Secondary | ICD-10-CM | POA: Diagnosis not present

## 2015-11-04 DIAGNOSIS — M79605 Pain in left leg: Secondary | ICD-10-CM

## 2015-11-15 DIAGNOSIS — C44319 Basal cell carcinoma of skin of other parts of face: Secondary | ICD-10-CM | POA: Diagnosis not present

## 2015-11-15 DIAGNOSIS — L905 Scar conditions and fibrosis of skin: Secondary | ICD-10-CM | POA: Diagnosis not present

## 2015-11-15 DIAGNOSIS — C44219 Basal cell carcinoma of skin of left ear and external auricular canal: Secondary | ICD-10-CM | POA: Diagnosis not present

## 2015-11-19 DIAGNOSIS — M5136 Other intervertebral disc degeneration, lumbar region: Secondary | ICD-10-CM | POA: Diagnosis not present

## 2015-11-19 DIAGNOSIS — M4806 Spinal stenosis, lumbar region: Secondary | ICD-10-CM | POA: Diagnosis not present

## 2015-11-19 DIAGNOSIS — M545 Low back pain: Secondary | ICD-10-CM | POA: Diagnosis not present

## 2015-11-22 DIAGNOSIS — D0462 Carcinoma in situ of skin of left upper limb, including shoulder: Secondary | ICD-10-CM | POA: Diagnosis not present

## 2015-12-06 DIAGNOSIS — M545 Low back pain: Secondary | ICD-10-CM | POA: Diagnosis not present

## 2015-12-06 DIAGNOSIS — M5136 Other intervertebral disc degeneration, lumbar region: Secondary | ICD-10-CM | POA: Diagnosis not present

## 2015-12-06 DIAGNOSIS — M4806 Spinal stenosis, lumbar region: Secondary | ICD-10-CM | POA: Diagnosis not present

## 2015-12-13 ENCOUNTER — Ambulatory Visit: Payer: PPO | Attending: Otolaryngology

## 2015-12-13 DIAGNOSIS — G4761 Periodic limb movement disorder: Secondary | ICD-10-CM | POA: Diagnosis not present

## 2015-12-13 DIAGNOSIS — G4733 Obstructive sleep apnea (adult) (pediatric): Secondary | ICD-10-CM | POA: Insufficient documentation

## 2015-12-13 DIAGNOSIS — R0683 Snoring: Secondary | ICD-10-CM | POA: Diagnosis not present

## 2015-12-19 ENCOUNTER — Ambulatory Visit: Payer: PPO | Admitting: Family Medicine

## 2015-12-31 DIAGNOSIS — M4806 Spinal stenosis, lumbar region: Secondary | ICD-10-CM | POA: Diagnosis not present

## 2015-12-31 DIAGNOSIS — M5136 Other intervertebral disc degeneration, lumbar region: Secondary | ICD-10-CM | POA: Diagnosis not present

## 2016-01-01 DIAGNOSIS — C44729 Squamous cell carcinoma of skin of left lower limb, including hip: Secondary | ICD-10-CM | POA: Diagnosis not present

## 2016-01-01 DIAGNOSIS — D485 Neoplasm of uncertain behavior of skin: Secondary | ICD-10-CM | POA: Diagnosis not present

## 2016-01-01 DIAGNOSIS — C44212 Basal cell carcinoma of skin of right ear and external auricular canal: Secondary | ICD-10-CM | POA: Diagnosis not present

## 2016-01-07 DIAGNOSIS — M5432 Sciatica, left side: Secondary | ICD-10-CM | POA: Diagnosis not present

## 2016-01-07 DIAGNOSIS — M9901 Segmental and somatic dysfunction of cervical region: Secondary | ICD-10-CM | POA: Diagnosis not present

## 2016-01-07 DIAGNOSIS — M9903 Segmental and somatic dysfunction of lumbar region: Secondary | ICD-10-CM | POA: Diagnosis not present

## 2016-01-07 DIAGNOSIS — M5033 Other cervical disc degeneration, cervicothoracic region: Secondary | ICD-10-CM | POA: Diagnosis not present

## 2016-01-09 DIAGNOSIS — M5033 Other cervical disc degeneration, cervicothoracic region: Secondary | ICD-10-CM | POA: Diagnosis not present

## 2016-01-09 DIAGNOSIS — M9903 Segmental and somatic dysfunction of lumbar region: Secondary | ICD-10-CM | POA: Diagnosis not present

## 2016-01-09 DIAGNOSIS — M9901 Segmental and somatic dysfunction of cervical region: Secondary | ICD-10-CM | POA: Diagnosis not present

## 2016-01-09 DIAGNOSIS — M5432 Sciatica, left side: Secondary | ICD-10-CM | POA: Diagnosis not present

## 2016-01-14 ENCOUNTER — Ambulatory Visit: Payer: PPO | Admitting: Internal Medicine

## 2016-01-14 DIAGNOSIS — M9901 Segmental and somatic dysfunction of cervical region: Secondary | ICD-10-CM | POA: Diagnosis not present

## 2016-01-14 DIAGNOSIS — M5033 Other cervical disc degeneration, cervicothoracic region: Secondary | ICD-10-CM | POA: Diagnosis not present

## 2016-01-14 DIAGNOSIS — M9903 Segmental and somatic dysfunction of lumbar region: Secondary | ICD-10-CM | POA: Diagnosis not present

## 2016-01-14 DIAGNOSIS — M5432 Sciatica, left side: Secondary | ICD-10-CM | POA: Diagnosis not present

## 2016-01-18 ENCOUNTER — Encounter (INDEPENDENT_AMBULATORY_CARE_PROVIDER_SITE_OTHER): Payer: Self-pay

## 2016-01-18 ENCOUNTER — Encounter: Payer: Self-pay | Admitting: Internal Medicine

## 2016-01-18 ENCOUNTER — Ambulatory Visit (INDEPENDENT_AMBULATORY_CARE_PROVIDER_SITE_OTHER): Payer: PPO | Admitting: Internal Medicine

## 2016-01-18 VITALS — BP 124/80 | HR 59 | Temp 98.0°F | Ht 68.0 in | Wt 207.4 lb

## 2016-01-18 DIAGNOSIS — G4733 Obstructive sleep apnea (adult) (pediatric): Secondary | ICD-10-CM | POA: Diagnosis not present

## 2016-01-18 DIAGNOSIS — I739 Peripheral vascular disease, unspecified: Secondary | ICD-10-CM

## 2016-01-18 DIAGNOSIS — I10 Essential (primary) hypertension: Secondary | ICD-10-CM

## 2016-01-18 DIAGNOSIS — K760 Fatty (change of) liver, not elsewhere classified: Secondary | ICD-10-CM | POA: Diagnosis not present

## 2016-01-18 DIAGNOSIS — Z79899 Other long term (current) drug therapy: Secondary | ICD-10-CM | POA: Diagnosis not present

## 2016-01-18 DIAGNOSIS — Z79891 Long term (current) use of opiate analgesic: Secondary | ICD-10-CM | POA: Diagnosis not present

## 2016-01-18 MED ORDER — ALPRAZOLAM 0.5 MG PO TABS: 0.5000 mg | ORAL_TABLET | Freq: Every evening | ORAL | 3 refills | Status: DC | PRN

## 2016-01-18 NOTE — Progress Notes (Signed)
Pre visit review using our clinic review tool, if applicable. No additional management support is needed unless otherwise documented below in the visit note. 

## 2016-01-18 NOTE — Patient Instructions (Addendum)
You do have sleep apnea that is  significant/severe. I have ordered the equipment for your you sleep apnea treatment.  You can use the alprazolam to helo you adjust to the mask   Regarding your leg pain :  I have ordered a vascular evaluatio nof your legs to make sure you do not have an arterial blockage causing your leg pain   Please return for fasting labs in September

## 2016-01-18 NOTE — Progress Notes (Signed)
Subjective:  Patient ID: Randall Kung., male    DOB: 1941-02-03  Age: 75 y.o. MRN: [id number]  CC: The primary encounter diagnosis was OSA (obstructive sleep apnea). Diagnoses of Claudication ([place]), PAD (peripheral artery disease) ([place]), Hepatic steatosis, and Essential hypertension were also pertinent to this visit.  HPI Randall Mckim. presents for FOLLOW UP ON SLEEP STUDY  Study was done on july 14 and sent directly to chart without my notificatio so he has not been notified of results. He has poor non restorative sleep, and has been observed to talk in his sleep frequently by his son.   Once a month has a nightmare bad enough to cause him to fall out of bed,  because the dreams are physically violent. Denies any history of physical violence   No restless leg symptoms .   But has  claudication limiting his walking .  Has chronic back pain  Due to spinal stenosis and has left hip pain and left leg that start hurting after a half a block   ABI's needed    Outpatient Medications Prior to Visit  Medication Sig Dispense Refill  . aspirin 81 MG EC tablet Take 81 mg by mouth daily.      . Calcium-Vitamin D (RA CALCIUM PLUS VITAMIN D) 600-125 MG-UNIT TABS Take 1 tablet by mouth 2 (two) times daily.     [name] lisinopril (PRINIVIL,ZESTRIL) 10 MG tablet Take 1 tablet (10 mg total) by mouth daily. 90 tablet 3  . metoprolol succinate (TOPROL XL) 25 MG 24 hr tablet Take 1 tablet (25 mg total) by mouth daily. 90 tablet 4  . simvastatin (ZOCOR) 40 MG tablet Take 1 tablet (40 mg total) by mouth at bedtime. 90 tablet 3  . traMADol (ULTRAM) 50 MG tablet Take 1 tablet (50 mg total) by mouth every 6 (six) hours as needed. 90 tablet 3  . vitamin B-12 (CYANOCOBALAMIN) 1000 MCG tablet Take 1,000 mcg by mouth daily.    . vitamin C (ASCORBIC ACID) 500 MG tablet Take 500 mg by mouth daily.    . Zinc 100 MG TABS Take 1 tablet by mouth daily.      [name] zolpidem (AMBIEN) 5 MG tablet Take 1 tablet (5 mg  total) by mouth at bedtime as needed for sleep. 30 tablet 5   No facility-administered medications prior to visit.     Review of Systems;  Patient denies headache, fevers, malaise, unintentional weight loss, skin rash, eye pain, sinus congestion and sinus pain, sore throat, dysphagia,  hemoptysis , cough, dyspnea, wheezing, chest pain, palpitations, orthopnea, edema, abdominal pain, nausea, melena, diarrhea, constipation, flank pain, dysuria, hematuria, urinary  Frequency, nocturia, numbness, tingling, seizures,  Focal weakness, Loss of consciousness,  Tremor,  depression, anxiety, and suicidal ideation.      Objective:  BP 124/80 (BP Location: Left Arm, Patient Position: Sitting, Cuff Size: Normal)   Pulse (!) 59   Temp 98 F (36.7 C) (Oral)   Ht '5\' 8"'$  (1.727 m)   Wt 207 lb 6.4 oz (94.1 kg)   SpO2 95%   BMI 31.54 kg/m   BP Readings from Last 3 Encounters:  01/18/16 124/80  10/08/15 118/68  09/24/15 124/70    Wt Readings from Last 3 Encounters:  01/18/16 207 lb 6.4 oz (94.1 kg)  10/08/15 198 lb 8 oz (90 kg)  09/24/15 204 lb 12.8 oz (92.9 kg)    General appearance: alert, cooperative and appears stated age Ears: normal TM's and external  ear canals both ears Throat: lips, mucosa, and tongue normal; teeth and gums normal Neck: no adenopathy, no carotid bruit, supple, symmetrical, trachea midline and thyroid not enlarged, symmetric, no tenderness/mass/nodules Back: symmetric, no curvature. ROM normal. No CVA tenderness. Lungs: clear to auscultation bilaterally Heart: regular rate and rhythm, S1, S2 normal, no murmur, click, rub or gallop Abdomen: soft, non-tender; bowel sounds normal; no masses,  no organomegaly Pulses: 2+ and symmetric Skin: Skin color, texture, turgor normal. No rashes or lesions Lymph nodes: Cervical, supraclavicular, and axillary nodes normal.  Lab Results  Component Value Date   HGBA1C 5.8 (H) 01/22/2015    Lab Results  Component Value Date    CREATININE 1.11 08/17/2015   CREATININE 1.06 02/12/2015   CREATININE 1.45 (H) 02/07/2015    Lab Results  Component Value Date   WBC 5.7 08/17/2015   HGB 14.4 08/17/2015   HCT 43.1 08/17/2015   PLT 194.0 08/17/2015   GLUCOSE 92 08/17/2015   CHOL 121 08/17/2015   TRIG 53.0 08/17/2015   HDL 48.00 08/17/2015   LDLDIRECT 63.0 08/17/2015   LDLCALC 62 08/17/2015   ALT 20 08/17/2015   AST 16 08/17/2015   NA 140 08/17/2015   K 4.9 08/17/2015   CL 106 08/17/2015   CREATININE 1.11 08/17/2015   BUN 18 08/17/2015   CO2 27 08/17/2015   TSH 3.318 02/02/2015   PSA 0.28 11/24/2014   INR 1.34 01/24/2015   HGBA1C 5.8 (H) 01/22/2015    Mr Lumbar Spine Wo Contrast  Result Date: 11/04/2015 CLINICAL DATA:  Low back pain with bilateral leg pain. Rule out spinal stenosis EXAM: MRI LUMBAR SPINE WITHOUT CONTRAST TECHNIQUE: Multiplanar, multisequence MR imaging of the lumbar spine was performed. No intravenous contrast was administered. COMPARISON:  Lumbar MRI 04/06/2008 FINDINGS: Segmentation:  Normal segmentation.  Lowest disc space L5-S1. Alignment:  Normal Vertebrae: Negative for fracture or mass. Interbody bony fusion at L2-3. Schmorl's nodes with surrounding edema at T12-L1 and L1-2. Conus medullaris: Extends to the L1-2 level and appears normal. Paraspinal and other soft tissues: The paraspinous muscles are normal. Retroperitoneal structures normal without mass or adenopathy. Disc levels: T12-L1: Moderate disc degeneration and spurring. Right foraminal encroachment due to spurring. L1-2: Moderate to advanced disc degeneration and spondylosis with diffuse endplate osteophyte formation. Bilateral facet hypertrophy. Moderate spinal stenosis with mild progression from the prior study. Mild foraminal narrowing bilaterally due to spurring L2-3: Bony fusion across the disc space since the prior study. This is presumably degenerative. Mild endplate spurring and facet degeneration. Mild spinal stenosis. L3-4:  Disc degeneration with diffuse disc bulging. Mild facet degeneration. Mild spinal stenosis. L4-5: Disc degeneration and disc bulging with mild spurring. Bilateral facet hypertrophy. Moderate spinal stenosis unchanged L5-S1: Mild disc bulging. Marked facet hypertrophy bilaterally causing foraminal narrowing bilaterally. IMPRESSION: Right foraminal narrowing T12-L1 due to spurring Disc degeneration and spondylosis L1-2 with moderate spinal stenosis which has progressed in the interval. Interbody bony fusion at L2-3.  Mild spinal stenosis Mild spinal stenosis L3-4 Moderate spinal stenosis L4-5 is unchanged Foraminal narrowing bilaterally L5-S1 due to facet hypertrophy. Electronically Signed   By: Franchot Gallo M.D.   On: 11/04/2015 16:05    Assessment & Plan:   Problem List Items Addressed This Visit    HTN (hypertension)    Well controlled on current regimen. Renal function stable, no changes today.  Lab Results  Component Value Date   CREATININE 1.11 08/17/2015   Lab Results  Component Value Date   NA 140 08/17/2015  K 4.9 08/17/2015   CL 106 08/17/2015   CO2 27 08/17/2015         PAD (peripheral artery disease) ([place])    Vascular evaluation ordered for eval of claudication symptoms.       Hepatic steatosis    Suggested by ultrasound of abdomen. Referral to Liver clinic was offered but deferred.  Liver enzymes remain normal  Lab Results  Component Value Date   ALT 20 08/17/2015   AST 16 08/17/2015   ALKPHOS 60 08/17/2015   BILITOT 0.7 08/17/2015         OSA (obstructive sleep apnea) - Primary    Severe, by recent study,  With BIPAP needed.  Mask ordered.  Alprazolam rx given to help him relax enough to tolerate mask       Relevant Orders   Ambulatory referral to Sleep Studies    Other Visit Diagnoses    Claudication [facility])       Relevant Orders   Ambulatory referral to Vascular Surgery      I am having Randall Ali start on ALPRAZolam. I am also having him maintain  his aspirin, Calcium-Vitamin D, Zinc, vitamin B-12, vitamin C, metoprolol succinate, simvastatin, lisinopril, traMADol, and zolpidem.  Meds ordered this encounter  Medications  . ALPRAZolam (XANAX) 0.5 MG tablet    Sig: Take 1 tablet (0.5 mg total) by mouth at bedtime as needed for anxiety.    Dispense:  30 tablet    Refill:  3    There are no discontinued medications.  Follow-up: No Follow-up on file.   [name], MD

## 2016-01-20 NOTE — Assessment & Plan Note (Signed)
Vascular evaluation ordered for eval of claudication symptoms.

## 2016-01-20 NOTE — Assessment & Plan Note (Signed)
Severe, by recent study,  With BIPAP needed.  Mask ordered.  Alprazolam rx given to help him relax enough to tolerate mask

## 2016-01-20 NOTE — Assessment & Plan Note (Signed)
Suggested by ultrasound of abdomen. Referral to Liver clinic was offered but deferred.  Liver enzymes remain normal  Lab Results  Component Value Date   ALT 20 08/17/2015   AST 16 08/17/2015   ALKPHOS 60 08/17/2015   BILITOT 0.7 08/17/2015

## 2016-01-20 NOTE — Assessment & Plan Note (Signed)
Well controlled on current regimen. Renal function stable, no changes today.  Lab Results  Component Value Date   CREATININE 1.11 08/17/2015   Lab Results  Component Value Date   NA 140 08/17/2015   K 4.9 08/17/2015   CL 106 08/17/2015   CO2 27 08/17/2015

## 2016-01-28 DIAGNOSIS — M5432 Sciatica, left side: Secondary | ICD-10-CM | POA: Diagnosis not present

## 2016-01-28 DIAGNOSIS — M9901 Segmental and somatic dysfunction of cervical region: Secondary | ICD-10-CM | POA: Diagnosis not present

## 2016-01-28 DIAGNOSIS — M9903 Segmental and somatic dysfunction of lumbar region: Secondary | ICD-10-CM | POA: Diagnosis not present

## 2016-01-28 DIAGNOSIS — M5033 Other cervical disc degeneration, cervicothoracic region: Secondary | ICD-10-CM | POA: Diagnosis not present

## 2016-02-05 ENCOUNTER — Telehealth: Payer: Self-pay | Admitting: *Deleted

## 2016-02-05 NOTE — Telephone Encounter (Signed)
See below

## 2016-02-05 NOTE — Telephone Encounter (Signed)
Patient had a sleep study done on July 6th, received a phone call today that he needs to have the study redone per hte insurance, he would like to discuss that and his ongoing fatigue that is still not improved.  Please advise  A date and time that would work with you. thanks

## 2016-02-05 NOTE — Telephone Encounter (Signed)
Patient requested an appt wit Dr.Tullo, he has extreme fatigue and would liek to discuss him having another sleep study. Pt contact [phone]

## 2016-02-05 NOTE — Telephone Encounter (Signed)
Ok to use a 4:30 or first available.

## 2016-02-05 NOTE — Telephone Encounter (Signed)
Nothing available til October unless we use a 11.30  Or 4.30  Next Tuesday we have 4.30 and Thursday a 11.30?

## 2016-02-06 ENCOUNTER — Encounter (INDEPENDENT_AMBULATORY_CARE_PROVIDER_SITE_OTHER): Payer: Self-pay

## 2016-02-06 ENCOUNTER — Encounter: Payer: Self-pay | Admitting: Family

## 2016-02-06 ENCOUNTER — Ambulatory Visit (INDEPENDENT_AMBULATORY_CARE_PROVIDER_SITE_OTHER): Payer: PPO | Admitting: Family

## 2016-02-06 VITALS — BP 134/64 | HR 67 | Temp 97.6°F | Wt 206.6 lb

## 2016-02-06 DIAGNOSIS — R140 Abdominal distension (gaseous): Secondary | ICD-10-CM | POA: Diagnosis not present

## 2016-02-06 LAB — COMPREHENSIVE METABOLIC PANEL
ALT: 16 U/L (ref 0–53)
AST: 15 U/L (ref 0–37)
Albumin: 4.3 g/dL (ref 3.5–5.2)
Alkaline Phosphatase: 51 U/L (ref 39–117)
BUN: 21 mg/dL (ref 6–23)
CO2: 27 mEq/L (ref 19–32)
Calcium: 9 mg/dL (ref 8.4–10.5)
Chloride: 104 mEq/L (ref 96–112)
Creatinine, Ser: 1.11 mg/dL (ref 0.40–1.50)
GFR: 68.84 mL/min (ref >60.00)
Glucose, Bld: 88 mg/dL (ref 70–99)
Potassium: 4.2 mEq/L (ref 3.5–5.1)
Sodium: 137 mEq/L (ref 135–145)
Total Bilirubin: 0.6 mg/dL (ref 0.2–1.2)
Total Protein: 6.4 g/dL (ref 6.0–8.3)

## 2016-02-06 LAB — CBC WITH DIFFERENTIAL/PLATELET
Basophils Absolute: 0 10*3/uL (ref 0.0–0.1)
Basophils Relative: 0.5 % (ref 0.0–3.0)
Eosinophils Absolute: 0.1 10*3/uL (ref 0.0–0.7)
Eosinophils Relative: 1.7 % (ref 0.0–5.0)
HCT: 41.8 % (ref 39.0–52.0)
Hemoglobin: 14.3 g/dL (ref 13.0–17.0)
Lymphocytes Relative: 31.4 % (ref 12.0–46.0)
Lymphs Abs: 2.5 10*3/uL (ref 0.7–4.0)
MCHC: 34.2 g/dL (ref 30.0–36.0)
MCV: 90.1 fl (ref 78.0–100.0)
Monocytes Absolute: 0.8 10*3/uL (ref 0.1–1.0)
Monocytes Relative: 10.1 % (ref 3.0–12.0)
Neutro Abs: 4.4 10*3/uL (ref 1.4–7.7)
Neutrophils Relative %: 56.3 % (ref 43.0–77.0)
Platelets: 215 10*3/uL (ref 150.0–400.0)
RBC: 4.64 Mil/uL (ref 4.22–5.81)
RDW: 12.5 % (ref 11.5–15.5)
WBC: 7.9 10*3/uL (ref 4.0–10.5)

## 2016-02-06 LAB — LIPASE: Lipase: 37 U/L (ref 11.0–59.0)

## 2016-02-06 NOTE — Progress Notes (Signed)
Pre visit review using our clinic review tool, if applicable. No additional management support is needed unless otherwise documented below in the visit note. 

## 2016-02-06 NOTE — Patient Instructions (Signed)
We will do lab work today however as we discussed, at this time, it is unclear the etiology of your abdominal bloating. If the lab work is unrevealing, as we decided, we will give this a little more time prior to ordering imaging. If your pain worsens or new symptoms present, please let us know ASAP or please go the nearest emergency room.

## 2016-02-06 NOTE — Progress Notes (Signed)
Subjective:    Patient ID: Randall Kung., male    DOB: [birth year], 75 y.o.   MRN: [id number]  CC: Randall Pinn. is a 75 y.o. male who presents today for an acute visit.    HPI: Patient here for acute visit for diffuse abdominal pain for past 4 days. "feels like took a pill and bothered stomach.' Constant cramp like pain. 'Not bad pain, aggravating pain.' Has f/u scheduled with PCP to discuss fatigue for months and recent sleep study. Endorses nausea, bloated. No fever, chills. No new foods, recent travel. Eating and drinking. Passing more gas than usual for past week. No increase in burping. Last BM today, normal for him, no blood.   UTD colonoscopy, normal.   No h/o reflux.       HISTORY:  Past Medical History:  Diagnosis Date  . Allergy    seasonal  . Arthritis    all over- in general   . CAD (coronary artery disease)    s/p inferior wall infarct in 10/01. has stent in Rt coronary artery. is due a stress myoview. does have some dyspnea on exertion  . Cancer (HCC)    skin, melanoma  . Cataract    removed  . Chest pain   . Erectile dysfunction   . GERD (gastroesophageal reflux disease)    pt. denies   . HLD (hyperlipidemia)    isdue followup lipids. on zocor 10 mg/day   . HTN (hypertension)   . 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 OSA (obstructive sleep apnea) 01/13/2015  . Reflux esophagitis   . SOB (shortness of breath) on exertion    Past Surgical History:  Procedure Laterality Date  . arm surgery  2010  . CARDIAC CATHETERIZATION  06/24/11  . CARDIAC CATHETERIZATION N/A 01/18/2015   Procedure: Left Heart Cath with coronary angiography;  Surgeon: Minna Merritts, MD;  Location: [facility];  Service: Cardiovascular;  Laterality: N/A;  . CARDIAC CATHETERIZATION N/A 01/18/2015   Procedure: Intravascular Pressure Wire/FFR Study;  Surgeon: [name], MD;  Location: [facility];  Service: Cardiovascular;   Laterality: N/A;  . CAROTID STENT  03/10/2011  . COLONOSCOPY  2010  . CORONARY ARTERY BYPASS GRAFT N/A 01/24/2015   Procedure: CORONARY ARTERY BYPASS GRAFTING x 4 (LIMA-LAD, SVG-Int 1- Int 2, SVG-PD) ENDOSCOPIC GREATER SAPHENOUS VEIN HARVEST LEFT LEG;  Surgeon: [name], MD;  Location: [facility];  Service: Open Heart Surgery;  Laterality: N/A;  . EYE SURGERY     lasik 15 yrs. ago, cataracts removed - both eyes   . HAMMER TOE SURGERY     right toe  . NASAL SINUS SURGERY  2008   septpolasty, bilateral turbinate reduction  . SHOULDER ARTHROSCOPY  2012  . TEE WITHOUT CARDIOVERSION N/A 01/24/2015   Procedure: TRANSESOPHAGEAL ECHOCARDIOGRAM (TEE);  Surgeon: [name], MD;  Location: [facility];  Service: Open Heart Surgery;  Laterality: N/A;  . [place]  . WRIST SURGERY  2011   Family History  Problem Relation Age of Onset  . Hypertension Mother   . Heart disease Mother   . Hypertension Father   . Diabetes Father   . Cancer Paternal Grandfather   . Heart disease Brother 64  . Colon cancer Neg Hx     Allergies: Review of patient's allergies indicates no known allergies. Current Outpatient Prescriptions on File Prior to Visit  Medication Sig Dispense Refill  .  ALPRAZolam (XANAX) 0.5 MG tablet Take 1 tablet (0.5 mg total) by mouth at bedtime as needed for anxiety. 30 tablet 3  . aspirin 81 MG EC tablet Take 81 mg by mouth daily.      . Calcium-Vitamin D (RA CALCIUM PLUS VITAMIN D) 600-125 MG-UNIT TABS Take 1 tablet by mouth 2 (two) times daily.     [name] lisinopril (PRINIVIL,ZESTRIL) 10 MG tablet Take 1 tablet (10 mg total) by mouth daily. 90 tablet 3  . metoprolol succinate (TOPROL XL) 25 MG 24 hr tablet Take 1 tablet (25 mg total) by mouth daily. 90 tablet 4  . simvastatin (ZOCOR) 40 MG tablet Take 1 tablet (40 mg total) by mouth at bedtime. 90 tablet 3  . traMADol (ULTRAM) 50 MG tablet Take 1 tablet (50 mg total) by mouth every 6 (six) hours as needed. 90 tablet 3  .  vitamin B-12 (CYANOCOBALAMIN) 1000 MCG tablet Take 1,000 mcg by mouth daily.    . vitamin C (ASCORBIC ACID) 500 MG tablet Take 500 mg by mouth daily.    . Zinc 100 MG TABS Take 1 tablet by mouth daily.      [name] zolpidem (AMBIEN) 5 MG tablet Take 1 tablet (5 mg total) by mouth at bedtime as needed for sleep. 30 tablet 5   No current facility-administered medications on file prior to visit.     Social History  Substance Use Topics  . Smoking status: Former Smoker    Packs/day: 2.50    Years: 40.00    Types: Cigarettes    Quit date: 03/24/2000  . Smokeless tobacco: Never Used  . Alcohol use No     Comment: occasional- 2 times per week     Review of Systems  Constitutional: Negative for chills and fever.  HENT: Negative for congestion, ear pain, rhinorrhea, sinus pressure and sore throat.   Respiratory: Negative for cough, shortness of breath and wheezing.   Cardiovascular: Negative for chest pain and palpitations.  Gastrointestinal: Positive for abdominal distention and nausea. Negative for abdominal pain, blood in stool, constipation, diarrhea and vomiting.  Genitourinary: Negative for dysuria.  Musculoskeletal: Negative for myalgias.  Skin: Negative for rash.  Neurological: Negative for headaches.  Hematological: Negative for adenopathy.      Objective:    BP 134/64   Pulse 67   Temp 97.6 F (36.4 C) (Oral)   Wt 206 lb 9.6 oz (93.7 kg)   SpO2 98%   BMI 31.41 kg/m    Physical Exam  Constitutional: He appears well-developed and well-nourished.  Cardiovascular: Regular rhythm and normal heart sounds.   Pulmonary/Chest: Effort normal and breath sounds normal. No respiratory distress. He has no wheezes. He has no rales.  Abdominal: Soft. Normal appearance and bowel sounds are normal. He exhibits no distension, no fluid wave, no ascites and no mass. There is no tenderness. There is no rigidity, no rebound, no guarding, no CVA tenderness, no tenderness at McBurney's point and  negative Murphy's sign.  No hernia, abdominal bruits.  Neurological: He is alert.  Skin: Skin is warm and dry.  Psychiatric: He has a normal mood and affect. His speech is normal and behavior is normal.  Vitals reviewed.      Assessment & Plan:  1. Abdominal distention Abdominal distention, discomfort is nonspecific at this time. I have suspicion that it may be gastroenteritis, likely self limiting. Patient and  I agreed to do lab work today. He politely declined any imaging today and preferred to see results  of labs and give this a little more time.Patient will let us know ASAP if pain persists, worsens, or new symptoms persist and we will further evaluate.  - CBC with Differential/Platelet - CMP and Liver - Lipase     I am having Mr. Tilly maintain his aspirin, Calcium-Vitamin D, Zinc, vitamin B-12, vitamin C, metoprolol succinate, simvastatin, lisinopril, traMADol, zolpidem, and ALPRAZolam.   No orders of the defined types were placed in this encounter.   Return precautions given.   Risks, benefits, and alternatives of the medications and treatment plan prescribed today were discussed, and patient expressed understanding.   Education regarding symptom management and diagnosis given to patient on AVS.  Continue to follow with TULLO, Aris Everts, MD for routine health maintenance.   Randall Kung. and I agreed with plan.   Mable Paris, FNP

## 2016-02-06 NOTE — Telephone Encounter (Signed)
Spoke with patient, he is having aute abdominal bloating/pain today, advised that I could schedule with our NP today to be seen and he agreed. thanks

## 2016-02-07 NOTE — Telephone Encounter (Signed)
Scheduled for 9/1 at 430pm. thanks

## 2016-02-07 NOTE — Telephone Encounter (Signed)
Patient would like to be seen on 09/01 at 430,to discuss his sleep study. Pleas advise if this would work for Dr. Derrel Nip

## 2016-02-08 ENCOUNTER — Ambulatory Visit (INDEPENDENT_AMBULATORY_CARE_PROVIDER_SITE_OTHER): Payer: PPO | Admitting: Internal Medicine

## 2016-02-08 ENCOUNTER — Encounter: Payer: Self-pay | Admitting: Internal Medicine

## 2016-02-08 VITALS — BP 138/72 | HR 73 | Temp 97.9°F | Resp 10 | Ht 68.0 in | Wt 206.5 lb

## 2016-02-08 DIAGNOSIS — Z23 Encounter for immunization: Secondary | ICD-10-CM

## 2016-02-08 DIAGNOSIS — G4733 Obstructive sleep apnea (adult) (pediatric): Secondary | ICD-10-CM | POA: Diagnosis not present

## 2016-02-08 NOTE — Patient Instructions (Signed)
We are re sending the order for your [facility] to [facility] .  We will call you on Tuesday after we hear from them  Your BP is the same in both arms today based on my reading,  But it was HIGH  Please return next week for an RN visit and [place]

## 2016-02-08 NOTE — Progress Notes (Signed)
Pre-visit discussion using our clinic review tool. No additional management support is needed unless otherwise documented below in the visit note.  

## 2016-02-08 NOTE — Progress Notes (Signed)
Subjective:  Patient ID: Randall Kung., male    DOB: 12-17-1940  Age: 75 y.o. MRN: [id number]  CC: The primary encounter diagnosis was OSA (obstructive sleep apnea). A diagnosis of Encounter for immunization was also pertinent to this visit.  HPI Randall Ali. presents for follow up sleep apnea diagnosed in July with formal sleep study .  He was diagnosed with a formal study after presenting with symptoms of daytime fatigue, non restorative rest, snoring and nightmares. He was fitted for BiPAP but has not received it yet for unclear reasons.  His insurance company called him recently and told him he would have to repeat the study becausethe one in July was more than a year from his previous one in 2013.  .  This makes no sense.   He contines to report the same sympotms and is eager to correct his sleep disorder.   Outpatient Medications Prior to Visit  Medication Sig Dispense Refill  . ALPRAZolam (XANAX) 0.5 MG tablet Take 1 tablet (0.5 mg total) by mouth at bedtime as needed for anxiety. 30 tablet 3  . aspirin 81 MG EC tablet Take 81 mg by mouth daily.      . Calcium-Vitamin D (RA CALCIUM PLUS VITAMIN D) 600-125 MG-UNIT TABS Take 1 tablet by mouth 2 (two) times daily.     [name] lisinopril (PRINIVIL,ZESTRIL) 10 MG tablet Take 1 tablet (10 mg total) by mouth daily. 90 tablet 3  . metoprolol succinate (TOPROL XL) 25 MG 24 hr tablet Take 1 tablet (25 mg total) by mouth daily. 90 tablet 4  . simvastatin (ZOCOR) 40 MG tablet Take 1 tablet (40 mg total) by mouth at bedtime. 90 tablet 3  . traMADol (ULTRAM) 50 MG tablet Take 1 tablet (50 mg total) by mouth every 6 (six) hours as needed. 90 tablet 3  . vitamin B-12 (CYANOCOBALAMIN) 1000 MCG tablet Take 1,000 mcg by mouth daily.    . vitamin C (ASCORBIC ACID) 500 MG tablet Take 500 mg by mouth daily.    . Zinc 100 MG TABS Take 1 tablet by mouth daily.      [name] zolpidem (AMBIEN) 5 MG tablet Take 1 tablet (5 mg total) by mouth at bedtime as  needed for sleep. 30 tablet 5   No facility-administered medications prior to visit.     Review of Systems;  Patient denies headache, fevers, malaise, unintentional weight loss, skin rash, eye pain, sinus congestion and sinus pain, sore throat, dysphagia,  hemoptysis , cough, dyspnea, wheezing, chest pain, palpitations, orthopnea, edema, abdominal pain, nausea, melena, diarrhea, constipation, flank pain, dysuria, hematuria, urinary  Frequency, nocturia, numbness, tingling, seizures,  Focal weakness, Loss of consciousness,  Tremor, insomnia, depression, anxiety, and suicidal ideation.      Objective:  BP 138/72   Pulse 73   Temp 97.9 F (36.6 C) (Oral)   Resp 10   Ht '5\' 8"'$  (1.727 m)   Wt 206 lb 8 oz (93.7 kg)   SpO2 94%   BMI 31.40 kg/m   BP Readings from Last 3 Encounters:  02/08/16 138/72  02/06/16 134/64  01/18/16 124/80    Wt Readings from Last 3 Encounters:  02/08/16 206 lb 8 oz (93.7 kg)  02/06/16 206 lb 9.6 oz (93.7 kg)  01/18/16 207 lb 6.4 oz (94.1 kg)    General appearance: alert, cooperative and appears stated age Ears: normal TM's and external ear canals both ears Throat: lips, mucosa, and tongue normal; teeth and gums normal  Neck: no adenopathy, no carotid bruit, supple, symmetrical, trachea midline and thyroid not enlarged, symmetric, no tenderness/mass/nodules Back: symmetric, no curvature. ROM normal. No CVA tenderness. Lungs: clear to auscultation bilaterally Heart: regular rate and rhythm, S1, S2 normal, no murmur, click, rub or gallop Abdomen: soft, non-tender; bowel sounds normal; no masses,  no organomegaly Pulses: 2+ and symmetric Skin: Skin color, texture, turgor normal. No rashes or lesions Lymph nodes: Cervical, supraclavicular, and axillary nodes normal.  Lab Results  Component Value Date   HGBA1C 5.8 (H) 01/22/2015    Lab Results  Component Value Date   CREATININE 1.11 02/06/2016   CREATININE 1.11 08/17/2015   CREATININE 1.06 02/12/2015     Lab Results  Component Value Date   WBC 7.9 02/06/2016   HGB 14.3 02/06/2016   HCT 41.8 02/06/2016   PLT 215.0 02/06/2016   GLUCOSE 88 02/06/2016   CHOL 121 08/17/2015   TRIG 53.0 08/17/2015   HDL 48.00 08/17/2015   LDLDIRECT 63.0 08/17/2015   LDLCALC 62 08/17/2015   ALT 16 02/06/2016   AST 15 02/06/2016   NA 137 02/06/2016   K 4.2 02/06/2016   CL 104 02/06/2016   CREATININE 1.11 02/06/2016   BUN 21 02/06/2016   CO2 27 02/06/2016   TSH 3.318 02/02/2015   PSA 0.28 11/24/2014   INR 1.34 01/24/2015   HGBA1C 5.8 (H) 01/22/2015    Randall Lumbar Spine Wo Contrast  Result Date: 11/04/2015 CLINICAL DATA:  Low back pain with bilateral leg pain. Rule out spinal stenosis EXAM: MRI LUMBAR SPINE WITHOUT CONTRAST TECHNIQUE: Multiplanar, multisequence Randall imaging of the lumbar spine was performed. No intravenous contrast was administered. COMPARISON:  Lumbar MRI 04/06/2008 FINDINGS: Segmentation:  Normal segmentation.  Lowest disc space L5-S1. Alignment:  Normal Vertebrae: Negative for fracture or mass. Interbody bony fusion at L2-3. Schmorl's nodes with surrounding edema at T12-L1 and L1-2. Conus medullaris: Extends to the L1-2 level and appears normal. Paraspinal and other soft tissues: The paraspinous muscles are normal. Retroperitoneal structures normal without mass or adenopathy. Disc levels: T12-L1: Moderate disc degeneration and spurring. Right foraminal encroachment due to spurring. L1-2: Moderate to advanced disc degeneration and spondylosis with diffuse endplate osteophyte formation. Bilateral facet hypertrophy. Moderate spinal stenosis with mild progression from the prior study. Mild foraminal narrowing bilaterally due to spurring L2-3: Bony fusion across the disc space since the prior study. This is presumably degenerative. Mild endplate spurring and facet degeneration. Mild spinal stenosis. L3-4: Disc degeneration with diffuse disc bulging. Mild facet degeneration. Mild spinal stenosis.  L4-5: Disc degeneration and disc bulging with mild spurring. Bilateral facet hypertrophy. Moderate spinal stenosis unchanged L5-S1: Mild disc bulging. Marked facet hypertrophy bilaterally causing foraminal narrowing bilaterally. IMPRESSION: Right foraminal narrowing T12-L1 due to spurring Disc degeneration and spondylosis L1-2 with moderate spinal stenosis which has progressed in the interval. Interbody bony fusion at L2-3.  Mild spinal stenosis Mild spinal stenosis L3-4 Moderate spinal stenosis L4-5 is unchanged Foraminal narrowing bilaterally L5-S1 due to facet hypertrophy. Electronically Signed   By: Franchot Gallo M.D.   On: 11/04/2015 16:05    Assessment & Plan:   Problem List Items Addressed This Visit    OSA (obstructive sleep apnea) - Primary    Severe, by recent study,  With BIPAP needed.  Mask ordered but not received yet. .  Alprazolam rx was given to help him relax enough to tolerate mask. Melissa has been asked to contact the supplier to facilitate expediting his mask  Relevant Orders   For home use only DME continuous positive airway pressure (CPAP)    Other Visit Diagnoses    Encounter for immunization       Relevant Orders   Flu vaccine HIGH DOSE PF (Completed)      I am having Randall. Ali maintain his aspirin, Calcium-Vitamin D, Zinc, vitamin B-12, vitamin C, metoprolol succinate, simvastatin, lisinopril, traMADol, zolpidem, and ALPRAZolam.  No orders of the defined types were placed in this encounter.   There are no discontinued medications.  Follow-up: Return in about 4 days (around 02/12/2016) for RN visit FOR BP [place] .   [name], MD

## 2016-02-11 NOTE — Assessment & Plan Note (Addendum)
Severe, by recent study,  With BIPAP needed.  Mask ordered but not received yet. .  Alprazolam rx was given to help him relax enough to tolerate mask. Melissa has been asked to contact the supplier to facilitate expediting his mask

## 2016-02-12 ENCOUNTER — Other Ambulatory Visit: Payer: Self-pay | Admitting: Internal Medicine

## 2016-02-12 DIAGNOSIS — E785 Hyperlipidemia, unspecified: Secondary | ICD-10-CM | POA: Diagnosis not present

## 2016-02-12 DIAGNOSIS — G4733 Obstructive sleep apnea (adult) (pediatric): Secondary | ICD-10-CM

## 2016-02-12 DIAGNOSIS — I10 Essential (primary) hypertension: Secondary | ICD-10-CM | POA: Diagnosis not present

## 2016-02-12 DIAGNOSIS — M79609 Pain in unspecified limb: Secondary | ICD-10-CM | POA: Diagnosis not present

## 2016-02-12 DIAGNOSIS — I70213 Atherosclerosis of native arteries of extremities with intermittent claudication, bilateral legs: Secondary | ICD-10-CM | POA: Diagnosis not present

## 2016-02-12 DIAGNOSIS — I2510 Atherosclerotic heart disease of native coronary artery without angina pectoris: Secondary | ICD-10-CM | POA: Diagnosis not present

## 2016-02-13 DIAGNOSIS — M9903 Segmental and somatic dysfunction of lumbar region: Secondary | ICD-10-CM | POA: Diagnosis not present

## 2016-02-13 DIAGNOSIS — M5432 Sciatica, left side: Secondary | ICD-10-CM | POA: Diagnosis not present

## 2016-02-13 DIAGNOSIS — M5033 Other cervical disc degeneration, cervicothoracic region: Secondary | ICD-10-CM | POA: Diagnosis not present

## 2016-02-13 DIAGNOSIS — M9901 Segmental and somatic dysfunction of cervical region: Secondary | ICD-10-CM | POA: Diagnosis not present

## 2016-02-15 DIAGNOSIS — M9901 Segmental and somatic dysfunction of cervical region: Secondary | ICD-10-CM | POA: Diagnosis not present

## 2016-02-15 DIAGNOSIS — M9903 Segmental and somatic dysfunction of lumbar region: Secondary | ICD-10-CM | POA: Diagnosis not present

## 2016-02-15 DIAGNOSIS — M5432 Sciatica, left side: Secondary | ICD-10-CM | POA: Diagnosis not present

## 2016-02-15 DIAGNOSIS — M5033 Other cervical disc degeneration, cervicothoracic region: Secondary | ICD-10-CM | POA: Diagnosis not present

## 2016-02-18 DIAGNOSIS — M9901 Segmental and somatic dysfunction of cervical region: Secondary | ICD-10-CM | POA: Diagnosis not present

## 2016-02-18 DIAGNOSIS — M5033 Other cervical disc degeneration, cervicothoracic region: Secondary | ICD-10-CM | POA: Diagnosis not present

## 2016-02-18 DIAGNOSIS — M9903 Segmental and somatic dysfunction of lumbar region: Secondary | ICD-10-CM | POA: Diagnosis not present

## 2016-02-18 DIAGNOSIS — M5432 Sciatica, left side: Secondary | ICD-10-CM | POA: Diagnosis not present

## 2016-02-19 ENCOUNTER — Ambulatory Visit: Payer: PPO | Attending: Otolaryngology

## 2016-02-19 DIAGNOSIS — R0683 Snoring: Secondary | ICD-10-CM | POA: Diagnosis not present

## 2016-02-19 DIAGNOSIS — G4733 Obstructive sleep apnea (adult) (pediatric): Secondary | ICD-10-CM | POA: Insufficient documentation

## 2016-02-25 DIAGNOSIS — D0421 Carcinoma in situ of skin of right ear and external auricular canal: Secondary | ICD-10-CM | POA: Diagnosis not present

## 2016-02-25 DIAGNOSIS — L905 Scar conditions and fibrosis of skin: Secondary | ICD-10-CM | POA: Diagnosis not present

## 2016-02-25 DIAGNOSIS — C44212 Basal cell carcinoma of skin of right ear and external auricular canal: Secondary | ICD-10-CM | POA: Diagnosis not present

## 2016-03-01 DIAGNOSIS — M9901 Segmental and somatic dysfunction of cervical region: Secondary | ICD-10-CM | POA: Diagnosis not present

## 2016-03-01 DIAGNOSIS — M5432 Sciatica, left side: Secondary | ICD-10-CM | POA: Diagnosis not present

## 2016-03-01 DIAGNOSIS — M5033 Other cervical disc degeneration, cervicothoracic region: Secondary | ICD-10-CM | POA: Diagnosis not present

## 2016-03-01 DIAGNOSIS — M9903 Segmental and somatic dysfunction of lumbar region: Secondary | ICD-10-CM | POA: Diagnosis not present

## 2016-03-03 DIAGNOSIS — C44729 Squamous cell carcinoma of skin of left lower limb, including hip: Secondary | ICD-10-CM | POA: Diagnosis not present

## 2016-03-03 DIAGNOSIS — L905 Scar conditions and fibrosis of skin: Secondary | ICD-10-CM | POA: Diagnosis not present

## 2016-03-07 ENCOUNTER — Telehealth: Payer: Self-pay | Admitting: Internal Medicine

## 2016-03-07 DIAGNOSIS — G4733 Obstructive sleep apnea (adult) (pediatric): Secondary | ICD-10-CM

## 2016-03-07 NOTE — Telephone Encounter (Signed)
Pt called requesting results from him sleep study. Thank you!  Call pt @ [phone]

## 2016-03-10 DIAGNOSIS — M9903 Segmental and somatic dysfunction of lumbar region: Secondary | ICD-10-CM | POA: Diagnosis not present

## 2016-03-10 DIAGNOSIS — M5033 Other cervical disc degeneration, cervicothoracic region: Secondary | ICD-10-CM | POA: Diagnosis not present

## 2016-03-10 DIAGNOSIS — M9901 Segmental and somatic dysfunction of cervical region: Secondary | ICD-10-CM | POA: Diagnosis not present

## 2016-03-10 DIAGNOSIS — M5432 Sciatica, left side: Secondary | ICD-10-CM | POA: Diagnosis not present

## 2016-03-10 NOTE — Telephone Encounter (Signed)
Sleep study confirmed that patient has moderate to severe sleep apnea,  but will need a second study to determine parameters for BIPAP use.  This will be ordered

## 2016-03-10 NOTE — Telephone Encounter (Signed)
Patient requesting results of sleep study.

## 2016-03-11 NOTE — Telephone Encounter (Signed)
Please call pt this afternoon at [phone]

## 2016-03-11 NOTE — Telephone Encounter (Signed)
Left message for patient to call office.  

## 2016-03-12 DIAGNOSIS — M5033 Other cervical disc degeneration, cervicothoracic region: Secondary | ICD-10-CM | POA: Diagnosis not present

## 2016-03-12 DIAGNOSIS — M9903 Segmental and somatic dysfunction of lumbar region: Secondary | ICD-10-CM | POA: Diagnosis not present

## 2016-03-12 DIAGNOSIS — M9901 Segmental and somatic dysfunction of cervical region: Secondary | ICD-10-CM | POA: Diagnosis not present

## 2016-03-12 DIAGNOSIS — M5432 Sciatica, left side: Secondary | ICD-10-CM | POA: Diagnosis not present

## 2016-03-12 NOTE — Telephone Encounter (Signed)
Patient requested a call with more questions Contact [phone]

## 2016-03-12 NOTE — Telephone Encounter (Signed)
Patient notified and voiced understanding.

## 2016-03-14 DIAGNOSIS — M5432 Sciatica, left side: Secondary | ICD-10-CM | POA: Diagnosis not present

## 2016-03-14 DIAGNOSIS — M5033 Other cervical disc degeneration, cervicothoracic region: Secondary | ICD-10-CM | POA: Diagnosis not present

## 2016-03-14 DIAGNOSIS — M9901 Segmental and somatic dysfunction of cervical region: Secondary | ICD-10-CM | POA: Diagnosis not present

## 2016-03-14 DIAGNOSIS — M9903 Segmental and somatic dysfunction of lumbar region: Secondary | ICD-10-CM | POA: Diagnosis not present

## 2016-03-18 ENCOUNTER — Telehealth: Payer: Self-pay | Admitting: Internal Medicine

## 2016-03-18 NOTE — Telephone Encounter (Signed)
Called patient had to re-read reason for repeat sleep study.

## 2016-03-18 NOTE — Telephone Encounter (Signed)
Stop drinkingsoft drinks.  They cause GERD .  Start OTR Prilosec daily,  Make appt if no better

## 2016-03-18 NOTE — Telephone Encounter (Signed)
Patient called and stated he has been having heart burn for last 2 weeks comes after eating or drinking something especially pepsi, with in 10 minutes of drinking pepsi patient stated his chest feels tight and burns, last night ate supper around 7 went to bed about 9:30 awoke with burning and tight ness in chest around 11.30. Patient stated so far today has not had any problem but has not ate anything. Patient denied any weakness or radiating pain to arm, neck or jaw.

## 2016-03-18 NOTE — Telephone Encounter (Signed)
Patient notified and voiced understanding.

## 2016-03-24 DIAGNOSIS — G4733 Obstructive sleep apnea (adult) (pediatric): Secondary | ICD-10-CM | POA: Diagnosis not present

## 2016-03-25 ENCOUNTER — Other Ambulatory Visit: Payer: Self-pay

## 2016-03-25 MED ORDER — LISINOPRIL 10 MG PO TABS: 10.0000 mg | ORAL_TABLET | Freq: Every day | ORAL | 3 refills | Status: DC

## 2016-03-27 ENCOUNTER — Ambulatory Visit: Payer: PPO | Admitting: Cardiovascular Disease

## 2016-03-28 ENCOUNTER — Telehealth: Payer: Self-pay | Admitting: *Deleted

## 2016-03-28 NOTE — Telephone Encounter (Signed)
Make sure he is taking it in the AM 30 minutes prior to food /coffee.  If so,  Have him double the dose to '40mg'$  daily for one week.   if that works I will send in an rx

## 2016-03-28 NOTE — Telephone Encounter (Signed)
Which medication would Dr. Derrel Nip suggest for patient normally.

## 2016-03-28 NOTE — Telephone Encounter (Signed)
Patient notified

## 2016-03-28 NOTE — Telephone Encounter (Signed)
Pt stated that he was advised to take Prilosec for his heartburn, however the medication hasn't worked for him. He requested advise on another medication  Pt contact [phone]

## 2016-04-01 ENCOUNTER — Encounter: Payer: Self-pay | Admitting: Internal Medicine

## 2016-04-01 ENCOUNTER — Ambulatory Visit (INDEPENDENT_AMBULATORY_CARE_PROVIDER_SITE_OTHER): Payer: PPO | Admitting: Internal Medicine

## 2016-04-01 ENCOUNTER — Other Ambulatory Visit (INDEPENDENT_AMBULATORY_CARE_PROVIDER_SITE_OTHER): Payer: Self-pay | Admitting: Vascular Surgery

## 2016-04-01 VITALS — BP 128/74 | HR 74 | Ht 68.0 in | Wt 201.0 lb

## 2016-04-01 DIAGNOSIS — R1310 Dysphagia, unspecified: Secondary | ICD-10-CM

## 2016-04-01 DIAGNOSIS — K2100 Gastro-esophageal reflux disease with esophagitis, without bleeding: Secondary | ICD-10-CM

## 2016-04-01 DIAGNOSIS — I739 Peripheral vascular disease, unspecified: Secondary | ICD-10-CM

## 2016-04-01 DIAGNOSIS — M5432 Sciatica, left side: Secondary | ICD-10-CM | POA: Diagnosis not present

## 2016-04-01 DIAGNOSIS — M79605 Pain in left leg: Principal | ICD-10-CM

## 2016-04-01 DIAGNOSIS — M9903 Segmental and somatic dysfunction of lumbar region: Secondary | ICD-10-CM | POA: Diagnosis not present

## 2016-04-01 DIAGNOSIS — M9901 Segmental and somatic dysfunction of cervical region: Secondary | ICD-10-CM | POA: Diagnosis not present

## 2016-04-01 DIAGNOSIS — M79604 Pain in right leg: Secondary | ICD-10-CM

## 2016-04-01 DIAGNOSIS — M5033 Other cervical disc degeneration, cervicothoracic region: Secondary | ICD-10-CM | POA: Diagnosis not present

## 2016-04-01 DIAGNOSIS — Z8719 Personal history of other diseases of the digestive system: Secondary | ICD-10-CM

## 2016-04-01 MED ORDER — OMEPRAZOLE 20 MG PO CPDR: 20.0000 mg | DELAYED_RELEASE_CAPSULE | Freq: Two times a day (BID) | ORAL | 1 refills | Status: DC

## 2016-04-01 NOTE — Progress Notes (Signed)
Subjective:    Patient ID: Randall Ali., male    DOB: 1940-07-30, 75 y.o.   MRN: [id number]  HPI Randall Ali is a 74 year old male with a past medical history of GERD with reflux esophagitis, mild esophageal dysmotility, CAD, hypertension, hyperlipidemia who seen in follow-up. He was last in the office in April 2016 after being diagnosed with reflux esophagitis. He took omeprazole for some time after that last visit but then discontinue the medication. He still had intermittent issues with solid food dysphagia particularly food such as apples and steak or chicken. Occasionally a large vitamin pill will "hang-up" and feels like it stops at the sternal notch. Over the last few weeks he's had a flare of actual heartburn which for him has been rare. Symptoms became somewhat severe and he called primary care and was advised to get omeprazole 20 mg over-the-counter and take twice a day before meals. He has done this for the last 72 hours and his heartburn is better. He denies nausea and vomiting. His appetite is good. His weight has been stable. His bowel movements regular without blood or melena.  I have reviewed his upper endoscopy which I performed last year as well as barium swallow also performed last year.  Review of Systems As per history of present illness, otherwise negative    Objective:   Physical Exam BP 128/74   Pulse 74   Ht '5\' 8"'$  (1.727 m)   Wt 201 lb (91.2 kg)   BMI 30.56 kg/m  Constitutional: Well-developed and well-nourished. No distress. HEENT: Normocephalic and atraumatic. Oropharynx is clear and moist. No oropharyngeal exudate. Conjunctivae are normal.  No scleral icterus. Neck: Neck supple. Trachea midline. Cardiovascular: Normal rate, regular rhythm and intact distal pulses. No M/R/G Pulmonary/chest: Effort normal and breath sounds normal. No wheezing, rales or rhonchi. Abdominal: Soft, mild tenderness just below xiphoid without rebound or guarding, nondistended.  Bowel sounds active throughout. There are no masses palpable. No hepatosplenomegaly. Diastases recti present Extremities: no clubbing, cyanosis, or edema Lymphadenopathy: No cervical adenopathy noted. Neurological: Alert and oriented to person place and time. Skin: Skin is warm and dry. No rashes noted. Psychiatric: Normal mood and affect. Behavior is normal.  CBC    Component Value Date/Time   WBC 7.9 02/06/2016 1248   RBC 4.64 02/06/2016 1248   HGB 14.3 02/06/2016 1248   HCT 41.8 02/06/2016 1248   HCT 44.2 01/15/2015 0956   PLT 215.0 02/06/2016 1248   PLT 207 01/15/2015 0956   MCV 90.1 02/06/2016 1248   MCV 91 01/15/2015 0956   MCH 29.1 02/12/2015 2114   MCHC 34.2 02/06/2016 1248   RDW 12.5 02/06/2016 1248   RDW 12.9 01/15/2015 0956   LYMPHSABS 2.5 02/06/2016 1248   LYMPHSABS 1.9 01/15/2015 0956   MONOABS 0.8 02/06/2016 1248   EOSABS 0.1 02/06/2016 1248   EOSABS 0.2 01/15/2015 0956   BASOSABS 0.0 02/06/2016 1248   BASOSABS 0.0 01/15/2015 0956   CMP     Component Value Date/Time   NA 137 02/06/2016 1248   NA 138 01/15/2015 0956   K 4.2 02/06/2016 1248   CL 104 02/06/2016 1248   CO2 27 02/06/2016 1248   GLUCOSE 88 02/06/2016 1248   BUN 21 02/06/2016 1248   BUN 16 01/15/2015 0956   CREATININE 1.11 02/06/2016 1248   CALCIUM 9.0 02/06/2016 1248   PROT 6.4 02/06/2016 1248   ALBUMIN 4.3 02/06/2016 1248   AST 15 02/06/2016 1248   ALT 16 02/06/2016 1248  ALKPHOS 51 02/06/2016 1248   BILITOT 0.6 02/06/2016 1248   GFRNONAA >60 02/12/2015 2114   GFRAA >60 02/12/2015 2114       Assessment & Plan:  75 year old male with a past medical history of GERD with reflux esophagitis, mild esophageal dysmotility, CAD, hypertension, hyperlipidemia who seen in follow-up.   1. GERD with hx of esophagitis/dysphagia -- Flare of reflux recently improved with twice a day omeprazole. His dysphagia has been persistent and somewhat worsened since last seeing me. He very well may have a  stricture at the base of his esophagus due to history of esophagitis. I recommended we repeat upper endoscopy and consider possible dilation depending on findings. We discussed the risk, benefit and alternative and he wishes to proceed. I would like him to continue omeprazole 20 mg twice a day before meals until December 1 and then reduce to once daily dosing. GERD diet reviewed including reducing caffeine.  2. Diastasis recti -- informed him as to this condition and that it is not a hernia. Reassurance provided  3. History of pulmonary nodule -- he inquires about the need for surveillance CT scan. I reviewed the chest CT from August 2016 which showed a 7 mm right upper lobe pulmonary nodule which was stable. I will defer this to primary care.  25 minutes spent with the patient today. Greater than 50% was spent in counseling and coordination of care with the patient

## 2016-04-01 NOTE — Patient Instructions (Signed)
You have been scheduled for an endoscopy. Please follow written instructions given to you at your visit today. If you use inhalers (even only as needed), please bring them with you on the day of your procedure. Your physician has requested that you go to www.startemmi.com and enter the access code given to you at your visit today. This web site gives a general overview about your procedure. However, you should still follow specific instructions given to you by our office regarding your preparation for the procedure.  We have sent the following medications to your pharmacy for you to pick up at your convenience: Omeprazole 20 mg twice daily before meals x 1 month, then decrease to once daily thereafter.  If you are age 74 or older, your body mass index should be between 23-30. Your Body mass index is 30.56 kg/m. If this is out of the aforementioned range listed, please consider follow up with your Primary Care Provider.  If you are age 58 or younger, your body mass index should be between 19-25. Your Body mass index is 30.56 kg/m. If this is out of the aformentioned range listed, please consider follow up with your Primary Care Provider.

## 2016-04-02 ENCOUNTER — Ambulatory Visit (INDEPENDENT_AMBULATORY_CARE_PROVIDER_SITE_OTHER): Payer: PPO | Admitting: Vascular Surgery

## 2016-04-02 ENCOUNTER — Encounter (INDEPENDENT_AMBULATORY_CARE_PROVIDER_SITE_OTHER): Payer: Self-pay | Admitting: Vascular Surgery

## 2016-04-02 ENCOUNTER — Ambulatory Visit (INDEPENDENT_AMBULATORY_CARE_PROVIDER_SITE_OTHER): Payer: PPO

## 2016-04-02 VITALS — BP 139/70 | HR 59 | Resp 17 | Ht 68.0 in | Wt 202.0 lb

## 2016-04-02 DIAGNOSIS — M79605 Pain in left leg: Secondary | ICD-10-CM | POA: Diagnosis not present

## 2016-04-02 DIAGNOSIS — M79604 Pain in right leg: Secondary | ICD-10-CM | POA: Diagnosis not present

## 2016-04-02 DIAGNOSIS — I739 Peripheral vascular disease, unspecified: Secondary | ICD-10-CM

## 2016-04-02 DIAGNOSIS — E785 Hyperlipidemia, unspecified: Secondary | ICD-10-CM | POA: Diagnosis not present

## 2016-04-02 DIAGNOSIS — I10 Essential (primary) hypertension: Secondary | ICD-10-CM

## 2016-04-02 NOTE — Progress Notes (Signed)
Subjective:    Patient ID: Randall Kung., male    DOB: Jul 15, 1940, 75 y.o.   MRN: [id number] Chief Complaint  Patient presents with  . Follow-up   Patient last seen on 02/12/16 referred from Dr. Derrel Nip for lower extremity symptoms consistent with claudication. He has a known history of back problems and has been told he has nerve damage which is what they had attributed the pain to previously. Has been told he needs "back surgery" however has not decided if he is ready to proceed. The patient underwent an ABI which showed Right ABI: 1.09 and Left 0.88 (no previous for comparison). A bilateral lower extremity arterial duplex is notable for triphasic blood flow distally without any significant hemodynamic stenosis noted.     Review of Systems  Constitutional: Negative.   HENT: Negative.   Eyes: Negative.   Respiratory: Negative.   Cardiovascular:       Lower extremity pain  Gastrointestinal: Negative.   Endocrine: Negative.   Genitourinary: Negative.   Musculoskeletal: Negative.   Skin: Negative.   Allergic/Immunologic: Negative.   Neurological: Negative.   Hematological: Negative.   Psychiatric/Behavioral: Negative.       Objective:   Physical Exam  Constitutional: He is oriented to person, place, and time. He appears well-developed and well-nourished.  HENT:  Head: Normocephalic and atraumatic.  Cardiovascular: Normal rate, regular rhythm, normal heart sounds and intact distal pulses.   Pulses:      Radial pulses are 2+ on the right side, and 2+ on the left side.       Dorsalis pedis pulses are 2+ on the right side, and 2+ on the left side.       Posterior tibial pulses are 2+ on the right side, and 2+ on the left side.  Pulmonary/Chest: Effort normal and breath sounds normal.  Abdominal: Soft. Bowel sounds are normal.  Musculoskeletal: Normal range of motion. He exhibits no edema.  Neurological: He is alert and oriented to person, place, and time.  Skin: Skin is warm  and dry.  Psychiatric: He has a normal mood and affect. His behavior is normal. Judgment and thought content normal.   BP 139/70   Pulse (!) 59   Resp 17   Ht '5\' 8"'$  (1.727 m)   Wt 202 lb (91.6 kg)   BMI 30.71 kg/m   Past Medical History:  Diagnosis Date  . Allergy    seasonal  . Arthritis    all over- in general   . CAD (coronary artery disease)    s/p inferior wall infarct in 10/01. has stent in Rt coronary artery. is due a stress myoview. does have some dyspnea on exertion  . Cancer (HCC)    skin, melanoma  . Cataract    removed  . Chest pain   . Erectile dysfunction   . GERD (gastroesophageal reflux disease)    pt. denies   . HLD (hyperlipidemia)    isdue followup lipids. on zocor 10 mg/day   . HTN (hypertension)   . 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 OSA (obstructive sleep apnea) 01/13/2015  . Reflux esophagitis   . Sleep apnea 2017   CPAP at night  . SOB (shortness of breath) on exertion    Social History   Social History  . Marital status: Single    Spouse name: N/A  . Number of children: N/A  . Years of education: 91   Occupational History  . retired  [employer]   Social History Main Topics  . Smoking status: Former Smoker    Packs/day: 2.50    Years: 40.00    Types: Cigarettes    Quit date: 03/24/2000  . Smokeless tobacco: Never Used  . Alcohol use No     Comment: occasional- 2 times per week   . Drug use: No  . Sexual activity: Not on file   Other Topics Concern  . Not on file   Social History Narrative   Singled; retired, part time; gets regular exercise.    Caffeine Use-yes         Past Surgical History:  Procedure Laterality Date  . arm surgery  2010  . CARDIAC CATHETERIZATION  06/24/11  . CARDIAC CATHETERIZATION N/A 01/18/2015   Procedure: Left Heart Cath with coronary angiography;  Surgeon: Minna Merritts, MD;  Location: [facility];  Service: Cardiovascular;  Laterality: N/A;  . CARDIAC  CATHETERIZATION N/A 01/18/2015   Procedure: Intravascular Pressure Wire/FFR Study;  Surgeon: [name], MD;  Location: [facility];  Service: Cardiovascular;  Laterality: N/A;  . CAROTID STENT  03/10/2011  . COLONOSCOPY  2010  . CORONARY ARTERY BYPASS GRAFT N/A 01/24/2015   Procedure: CORONARY ARTERY BYPASS GRAFTING x 4 (LIMA-LAD, SVG-Int 1- Int 2, SVG-PD) ENDOSCOPIC GREATER SAPHENOUS VEIN HARVEST LEFT LEG;  Surgeon: [name], MD;  Location: [facility];  Service: Open Heart Surgery;  Laterality: N/A;  . EYE SURGERY     lasik 15 yrs. ago, cataracts removed - both eyes   . HAMMER TOE SURGERY     right toe  . NASAL SINUS SURGERY  2008   septpolasty, bilateral turbinate reduction  . SHOULDER ARTHROSCOPY  2012  . TEE WITHOUT CARDIOVERSION N/A 01/24/2015   Procedure: TRANSESOPHAGEAL ECHOCARDIOGRAM (TEE);  Surgeon: [name], MD;  Location: [facility];  Service: Open Heart Surgery;  Laterality: N/A;  . [place]  . WRIST SURGERY  2011   Family History  Problem Relation Age of Onset  . Hypertension Mother   . Heart disease Mother   . Hypertension Father   . Diabetes Father   . Heart disease Brother 34  . Cancer Paternal Grandfather   . Colon cancer Neg Hx    No Known Allergies     Assessment & Plan:  Patient last seen on 02/12/16 referred from Dr. Derrel Nip for lower extremity symptoms consistent with claudication. He has a known history of back problems and has been told he has nerve damage which is what they had attributed the pain to previously. Has been told he needs "back surgery" however has not decided if he is ready to proceed. The patient underwent an ABI which showed Right ABI: 1.09 and Left 0.88 (no previous for comparison). A bilateral lower extremity arterial duplex is notable for triphasic blood flow distally without any significant hemodynamic stenosis noted.    1. PAD (peripheral artery disease) ([place]) - New No significant PAD found on exam or studies.  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 Recommend yearly ABI's due to his multiple PAD risk factors.   2. Essential hypertension - Stable Encouraged good control as its slows the progression of atherosclerotic disease  3. Hyperlipidemia, unspecified hyperlipidemia type - Stable Patient on ASA and statin for medical optimization. Encouraged good control as its slows the progression of atherosclerotic disease  Current Outpatient Prescriptions on File Prior to Visit  Medication Sig  Dispense Refill  . ALPRAZolam (XANAX) 0.5 MG tablet Take 1 tablet (0.5 mg total) by mouth at bedtime as needed for anxiety. 30 tablet 3  . aspirin 81 MG EC tablet Take 81 mg by mouth daily.      . Calcium-Vitamin D (RA CALCIUM PLUS VITAMIN D) 600-125 MG-UNIT TABS Take 1 tablet by mouth 2 (two) times daily.     [name] lisinopril (PRINIVIL,ZESTRIL) 10 MG tablet Take 1 tablet (10 mg total) by mouth daily. 90 tablet 3  . metoprolol succinate (TOPROL XL) 25 MG 24 hr tablet Take 1 tablet (25 mg total) by mouth daily. 90 tablet 4  . omeprazole (PRILOSEC) 20 MG capsule Take 1 capsule (20 mg total) by mouth 2 (two) times daily before a meal. 60 capsule 1  . simvastatin (ZOCOR) 40 MG tablet Take 1 tablet (40 mg total) by mouth at bedtime. 90 tablet 3  . traMADol (ULTRAM) 50 MG tablet Take 1 tablet (50 mg total) by mouth every 6 (six) hours as needed. 90 tablet 3  . vitamin B-12 (CYANOCOBALAMIN) 1000 MCG tablet Take 1,000 mcg by mouth daily.    . vitamin C (ASCORBIC ACID) 500 MG tablet Take 500 mg by mouth daily.    . Zinc 100 MG TABS Take 1 tablet by mouth daily.      [name] zolpidem (AMBIEN) 5 MG tablet Take 1 tablet (5 mg total) by mouth at bedtime as needed for sleep. 30 tablet 5   No current facility-administered medications on file prior to visit.     There are no Patient Instructions on file for this visit. Return in about 1 year (around 04/02/2017) for Yearly PAD follow  up.   Charles Niese A Dereonna Lensing, PA-C

## 2016-04-05 DIAGNOSIS — M5033 Other cervical disc degeneration, cervicothoracic region: Secondary | ICD-10-CM | POA: Diagnosis not present

## 2016-04-05 DIAGNOSIS — M9903 Segmental and somatic dysfunction of lumbar region: Secondary | ICD-10-CM | POA: Diagnosis not present

## 2016-04-05 DIAGNOSIS — M5432 Sciatica, left side: Secondary | ICD-10-CM | POA: Diagnosis not present

## 2016-04-05 DIAGNOSIS — M9901 Segmental and somatic dysfunction of cervical region: Secondary | ICD-10-CM | POA: Diagnosis not present

## 2016-04-06 ENCOUNTER — Telehealth: Payer: Self-pay | Admitting: Family Medicine

## 2016-04-06 NOTE — Telephone Encounter (Signed)
Ok by me. Thanks for letting me know

## 2016-04-06 NOTE — Telephone Encounter (Signed)
Received request for switch in PCP to myself as pt requests male provider. If ok by current PCP, plz call and offer next 30 min appt.  Will cc Dr Derrel Nip.  Thanks, Garlon Hatchet

## 2016-04-07 ENCOUNTER — Ambulatory Visit (INDEPENDENT_AMBULATORY_CARE_PROVIDER_SITE_OTHER): Payer: PPO | Admitting: Family Medicine

## 2016-04-07 ENCOUNTER — Encounter: Payer: Self-pay | Admitting: Family Medicine

## 2016-04-07 VITALS — BP 130/74 | HR 64 | Temp 97.9°F | Wt 210.0 lb

## 2016-04-07 DIAGNOSIS — I10 Essential (primary) hypertension: Secondary | ICD-10-CM | POA: Diagnosis not present

## 2016-04-07 DIAGNOSIS — R5382 Chronic fatigue, unspecified: Secondary | ICD-10-CM

## 2016-04-07 DIAGNOSIS — I2510 Atherosclerotic heart disease of native coronary artery without angina pectoris: Secondary | ICD-10-CM | POA: Diagnosis not present

## 2016-04-07 DIAGNOSIS — R5381 Other malaise: Secondary | ICD-10-CM

## 2016-04-07 DIAGNOSIS — G4733 Obstructive sleep apnea (adult) (pediatric): Secondary | ICD-10-CM

## 2016-04-07 DIAGNOSIS — I739 Peripheral vascular disease, unspecified: Secondary | ICD-10-CM

## 2016-04-07 DIAGNOSIS — R0981 Nasal congestion: Secondary | ICD-10-CM | POA: Diagnosis not present

## 2016-04-07 DIAGNOSIS — E785 Hyperlipidemia, unspecified: Secondary | ICD-10-CM

## 2016-04-07 MED ORDER — FLUTICASONE PROPIONATE 50 MCG/ACT NA SUSP: 2.0000 | Freq: Every day | NASAL | 3 refills | Status: DC

## 2016-04-07 MED ORDER — TRAMADOL HCL 50 MG PO TABS: 50.0000 mg | ORAL_TABLET | Freq: Four times a day (QID) | ORAL | 1 refills | Status: DC | PRN

## 2016-04-07 NOTE — Progress Notes (Addendum)
BP 130/74   Pulse 64   Temp 97.9 F (36.6 C) (Oral)   Wt 210 lb (95.3 kg)   SpO2 96%   BMI 31.93 kg/m    CC: transfer of care Subjective:    Patient ID: Randall Ali., male    DOB: 03/12/1941, 75 y.o.   MRN: [id number]  HPI: Randall Ali. is a 75 y.o. male presenting on 04/07/2016 for Establish Care (transfer from Dr. Derrel Nip) and URI (Cough; ST; HA; x 3-4 days)   5d h/o frontal sinus pressure HA, congestion, cough productive of green mucous, sore throat with PNDrainage. Does not regularly get sinus infections. H/o sinus surgery by Dr Tami Ribas.   No fevers/chills, ear or tooth pain. No new body aches.  No smokers at home.  No sick contacts at home.  Treating with mucinex which hasn't really helped. Taking advil.   OSA - Hasn't been feeling well for last 6 months - with malaise and fatigue. Giving CPAP a try. Struggling getting used to this. Has not seen sleep medicine. Started CPAP machine last week. Also takes ambien PRN sleep. Never tried xanax.   Recent eval by [facility] for LE pain - ABIs stable without significant stenosis.   Takes tramadol for R chronic foot pain, failed several surgeries. Takes this 1-2 times daily, up to TID.  By the way "I think I broke my little toe last week". Feeling better this week.   New GERD - planned EGD and stricture by Dr Hilarie Fredrickson later in the year.    S/p CABG 2016.   Relevant past medical, surgical, family and social history reviewed and updated as indicated. Interim medical history since our last visit reviewed. Allergies and medications reviewed and updated. Current Outpatient Prescriptions on File Prior to Visit  Medication Sig  . aspirin 81 MG EC tablet Take 81 mg by mouth daily.    . Calcium-Vitamin D (RA CALCIUM PLUS VITAMIN D) 600-125 MG-UNIT TABS Take 1 tablet by mouth 2 (two) times daily.   [name] lisinopril (PRINIVIL,ZESTRIL) 10 MG tablet Take 1 tablet (10 mg total) by mouth daily.  . metoprolol succinate (TOPROL XL) 25 MG  24 hr tablet Take 1 tablet (25 mg total) by mouth daily.  [name] omeprazole (PRILOSEC) 20 MG capsule Take 1 capsule (20 mg total) by mouth 2 (two) times daily before a meal.  . simvastatin (ZOCOR) 40 MG tablet Take 1 tablet (40 mg total) by mouth at bedtime.  . vitamin B-12 (CYANOCOBALAMIN) 1000 MCG tablet Take 1,000 mcg by mouth daily.  . vitamin C (ASCORBIC ACID) 500 MG tablet Take 500 mg by mouth daily.  . Zinc 100 MG TABS Take 1 tablet by mouth daily.    [name] zolpidem (AMBIEN) 5 MG tablet Take 1 tablet (5 mg total) by mouth at bedtime as needed for sleep.   No current facility-administered medications on file prior to visit.     Review of Systems Per HPI unless specifically indicated in ROS section     Objective:    BP 130/74   Pulse 64   Temp 97.9 F (36.6 C) (Oral)   Wt 210 lb (95.3 kg)   SpO2 96%   BMI 31.93 kg/m   Wt Readings from Last 3 Encounters:  04/07/16 210 lb (95.3 kg)  04/02/16 202 lb (91.6 kg)  04/01/16 201 lb (91.2 kg)    Physical Exam  Constitutional: He appears well-developed and well-nourished. No distress.  HENT:  Head: Normocephalic and atraumatic.  Right  Ear: Hearing, tympanic membrane, external ear and ear canal normal.  Left Ear: Hearing, tympanic membrane, external ear and ear canal normal.  Nose: Mucosal edema present. No rhinorrhea. Right sinus exhibits no maxillary sinus tenderness and no frontal sinus tenderness. Left sinus exhibits no maxillary sinus tenderness and no frontal sinus tenderness.  Mouth/Throat: Uvula is midline and mucous membranes are normal. Posterior oropharyngeal edema and posterior oropharyngeal erythema present. No oropharyngeal exudate or tonsillar abscesses.  Marked nasal mucosal pallor and edema  Eyes: Conjunctivae and EOM are normal. Pupils are equal, round, and reactive to light. No scleral icterus.  Neck: Normal range of motion. Neck supple. No thyromegaly present.  Cardiovascular: Normal rate, regular rhythm, normal heart  sounds and intact distal pulses.   No murmur heard. Pulmonary/Chest: Effort normal and breath sounds normal. No respiratory distress. He has no wheezes. He has no rales.  Musculoskeletal: He exhibits no edema.  Lymphadenopathy:    He has no cervical adenopathy.  Skin: Skin is warm and dry. No rash noted.  Psychiatric: He has a normal mood and affect.  Nursing note and vitals reviewed.  Results for orders placed or performed in visit on 02/06/16  CBC with Differential/Platelet  Result Value Ref Range   WBC 7.9 4.0 - 10.5 K/uL   RBC 4.64 4.22 - 5.81 Mil/uL   Hemoglobin 14.3 13.0 - 17.0 g/dL   HCT 41.8 39.0 - 52.0 %   MCV 90.1 78.0 - 100.0 fl   MCHC 34.2 30.0 - 36.0 g/dL   RDW 12.5 11.5 - 15.5 %   Platelets 215.0 150.0 - 400.0 K/uL   Neutrophils Relative % 56.3 43.0 - 77.0 %   Lymphocytes Relative 31.4 12.0 - 46.0 %   Monocytes Relative 10.1 3.0 - 12.0 %   Eosinophils Relative 1.7 0.0 - 5.0 %   Basophils Relative 0.5 0.0 - 3.0 %   Neutro Abs 4.4 1.4 - 7.7 K/uL   Lymphs Abs 2.5 0.7 - 4.0 K/uL   Monocytes Absolute 0.8 0.1 - 1.0 K/uL   Eosinophils Absolute 0.1 0.0 - 0.7 K/uL   Basophils Absolute 0.0 0.0 - 0.1 K/uL  Lipase  Result Value Ref Range   Lipase 37.0 11.0 - 59.0 U/L  Comprehensive metabolic panel  Result Value Ref Range   Sodium 137 135 - 145 mEq/L   Potassium 4.2 3.5 - 5.1 mEq/L   Chloride 104 96 - 112 mEq/L   CO2 27 19 - 32 mEq/L   Glucose, Bld 88 70 - 99 mg/dL   BUN 21 6 - 23 mg/dL   Creatinine, Ser 1.11 0.40 - 1.50 mg/dL   Total Bilirubin 0.6 0.2 - 1.2 mg/dL   Alkaline Phosphatase 51 39 - 117 U/L   AST 15 0 - 37 U/L   ALT 16 0 - 53 U/L   Total Protein 6.4 6.0 - 8.3 g/dL   Albumin 4.3 3.5 - 5.2 g/dL   Calcium 9.0 8.4 - 10.5 mg/dL   GFR 68.84 >60.00 mL/min      Assessment & Plan:   Problem List Items Addressed This Visit    CAD, NATIVE VESSEL    asxs at this time      Chronic fatigue and malaise    Previously attributed to OSA - no improvement after 1  wk CPAP. Will give more time, consider lab work to eval other causes of fatigue.       Essential hypertension    Chronic, stable. Continue current regimen.  Hyperlipidemia    Chronic. Continue simvastatin.      Nasal congestion - Primary    Viral URI vs allergic rhinitis. Discussed supportive care including nasal saline, nasal steroid. Update if symptoms persist past 7-10 days to consider oral abx course.       OSA (obstructive sleep apnea)    Known OSA with possible further sleep disorder. Reviewed recent sleep studies including latest showing optimal setting at BiPAP 18/14. Will need to verify he is now using BiPAP.       PAD (peripheral artery disease) (HCC)    Recent [facility] evaluation unrevealing.       Other Visit Diagnoses   None.      Follow up plan: Return in about 3 months (around 07/08/2016) for medicare wellness visit.  Ria Bush, MD

## 2016-04-07 NOTE — Progress Notes (Signed)
Pre visit review using our clinic review tool, if applicable. No additional management support is needed unless otherwise documented below in the visit note. 

## 2016-04-07 NOTE — Telephone Encounter (Signed)
I spoke to patient.  Patient scheduled appointment with Dr.Gutierrez today because he has a cold.

## 2016-04-07 NOTE — Assessment & Plan Note (Signed)
Chronic. Continue simvastatin.

## 2016-04-07 NOTE — Assessment & Plan Note (Signed)
Chronic, stable. Continue current regimen. 

## 2016-04-07 NOTE — Assessment & Plan Note (Signed)
Recent [facility] evaluation unrevealing.

## 2016-04-07 NOTE — Patient Instructions (Addendum)
Nice to meet you today. I think you have allergic congestion as well as possible sinusitis, likely viral. Continue to push fluids and rest. Continue advil, add on flonase nasal steroid. If no improvement over next 4-5 days let us know for antibiotic course. Return in 3 months for medicare wellness visit and labs.

## 2016-04-07 NOTE — Assessment & Plan Note (Addendum)
Known OSA with possible further sleep disorder. Reviewed recent sleep studies including latest showing optimal setting at BiPAP 18/14. Will need to verify he is now using BiPAP.

## 2016-04-07 NOTE — Assessment & Plan Note (Addendum)
Viral URI vs allergic rhinitis. Discussed supportive care including nasal saline, nasal steroid. Update if symptoms persist past 7-10 days to consider oral abx course.

## 2016-04-07 NOTE — Assessment & Plan Note (Signed)
asxs at this time

## 2016-04-07 NOTE — Assessment & Plan Note (Signed)
Previously attributed to OSA - no improvement after 1 wk CPAP. Will give more time, consider lab work to eval other causes of fatigue.

## 2016-04-17 ENCOUNTER — Encounter (HOSPITAL_COMMUNITY): Payer: Self-pay | Admitting: *Deleted

## 2016-04-21 ENCOUNTER — Ambulatory Visit (INDEPENDENT_AMBULATORY_CARE_PROVIDER_SITE_OTHER): Payer: PPO | Admitting: Cardiovascular Disease

## 2016-04-21 ENCOUNTER — Encounter: Payer: Self-pay | Admitting: Cardiovascular Disease

## 2016-04-21 VITALS — BP 120/70 | HR 60 | Ht 68.0 in | Wt 204.0 lb

## 2016-04-21 DIAGNOSIS — I739 Peripheral vascular disease, unspecified: Secondary | ICD-10-CM

## 2016-04-21 DIAGNOSIS — E7800 Pure hypercholesterolemia, unspecified: Secondary | ICD-10-CM | POA: Diagnosis not present

## 2016-04-21 DIAGNOSIS — I2510 Atherosclerotic heart disease of native coronary artery without angina pectoris: Secondary | ICD-10-CM | POA: Diagnosis not present

## 2016-04-21 DIAGNOSIS — I10 Essential (primary) hypertension: Secondary | ICD-10-CM

## 2016-04-21 DIAGNOSIS — I480 Paroxysmal atrial fibrillation: Secondary | ICD-10-CM | POA: Diagnosis not present

## 2016-04-21 DIAGNOSIS — Z951 Presence of aortocoronary bypass graft: Secondary | ICD-10-CM

## 2016-04-21 MED ORDER — METOPROLOL SUCCINATE ER 25 MG PO TB24: 25.0000 mg | ORAL_TABLET | Freq: Every day | ORAL | 4 refills | Status: DC

## 2016-04-21 MED ORDER — SIMVASTATIN 40 MG PO TABS: 40.0000 mg | ORAL_TABLET | Freq: Every day | ORAL | 3 refills | Status: DC

## 2016-04-21 NOTE — Progress Notes (Signed)
Cardiology Office Note  Date:  04/21/2016   ID:  Randall Ali., DOB June 20, 1940, MRN [id number]  PCP:  Ria Bush, MD   Chief Complaint  Patient presents with  . other    6 month f/u c/o heart burn. Meds reviewed verbally with pt.    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 Previous admit in 2011 for mild chest pain and sweating. Stress test showed no ischemia with inferior scar. Previous cardiac catheterization showing moderate OM disease.  Repeat catheterization  in August 2016 showing severe three-vessel disease, sent for CABG on 01/24/2015.  He presents today for follow-up of his coronary artery disease  GERD sx, started omeprazole twice a day Scheduled for an EGD Stopped drinking soda, sx better, maybe once in three weeks  Has OSA, trouble wearing CPAP Takes nyquil Has to drink ETOH to sleep  Back pain, ,hip pain, plays softball Goes to gym, rides bike  Total chol 121, LDL 62  EKG on today's visit shows normal sinus rhythm with rate 60 bpm, no significant ST or T-wave changes  Other past medical history reviewed postoperative atrial fibrillation,  postoperative delirium and began having fevers on 01/29/2015.  some drainage from the inferior portion of his sternal wound. Blood cultures and wound cultures grew Enterobacter aerogenes. He was discharged on IV ceftriaxone  and completed 6 weeks of therapy . He took probiotics during this time  In follow-up, he reports that he is doing well. He is walking 2 blocks per day He does not check his blood pressure at home. Recent cholesterol shows LDL in the 80s Denies any shortness of breath on exertion He does have chest pain when he coughs  EKG on today's visit shows normal sinus rhythm with rate 61 bpm, no significant ST or T-wave changes  Other past medical  history t right foot surgery in May 2016. repeat hardware fusion of his right great toe and had hammertoe repair of his right second toe.   postoperative infection with persistent drainage from the second toe. A culture in late July grew MSSA. course of doxycycline.  The drainage stopped but he has continued to have redness and swelling  He still has some pain when walking   In 2013, he was having worsening chest pain medically with exertion.  cardiac catheterization that showed moderate obtuse marginal disease, otherwise no significant stenosis. This was discussed with the interventional physicians in [facility] and recommendation was made for medical management.   previous Total cholesterol 140, LDL 78 prior EGD  that showed an ulcer. Previously had trouble swallowing.   PMH:   has a past medical history of Allergy; Arthritis; CAD (coronary artery disease); Cancer ([place]); Cataract; Chest pain; Erectile dysfunction; GERD (gastroesophageal reflux disease); HLD (hyperlipidemia); HTN (hypertension); Inferior myocardial infarction ([place]) (10/01); OSA (obstructive sleep apnea) (01/13/2015); Postoperative wound infection (02/02/2015); Reflux esophagitis; Sleep apnea (2017); and SOB (shortness of breath) on exertion.  PSH:    Past Surgical History:  Procedure Laterality Date  . arm surgery  2010  . CARDIAC CATHETERIZATION  06/24/11  . CARDIAC CATHETERIZATION N/A 01/18/2015   Procedure: Left Heart Cath with coronary angiography;  Surgeon: Minna Merritts, MD;  Location: [place] [facility];  Service: Cardiovascular;  Laterality: N/A;  . CARDIAC CATHETERIZATION N/A 01/18/2015   Procedure: Intravascular Pressure Wire/FFR Study;  Surgeon: Mertie Clause  Fletcher Anon, MD;  Location: [place] [facility];  Service: Cardiovascular;  Laterality: N/A;  . CAROTID STENT  03/10/2011  . COLONOSCOPY  2010  . CORONARY ARTERY BYPASS GRAFT N/A 01/24/2015   Procedure: CORONARY ARTERY BYPASS GRAFTING x 4 (LIMA-LAD, SVG-Int 1-  Int 2, SVG-PD) ENDOSCOPIC GREATER SAPHENOUS VEIN HARVEST LEFT LEG;  Surgeon: [name], MD;  Location: [place];  Service: Open Heart Surgery;  Laterality: N/A;  . EYE SURGERY     lasik 15 yrs. ago, cataracts removed - both eyes   . HAMMER TOE SURGERY     right toe  . NASAL SINUS SURGERY  2008   septpolasty, bilateral turbinate reduction  . SHOULDER ARTHROSCOPY  2012  . TEE WITHOUT CARDIOVERSION N/A 01/24/2015   Procedure: TRANSESOPHAGEAL ECHOCARDIOGRAM (TEE);  Surgeon: [name], MD;  Location: [place];  Service: Open Heart Surgery;  Laterality: N/A;  . [place]  . WRIST SURGERY  2011    Current Outpatient Prescriptions  Medication Sig Dispense Refill  . aspirin 81 MG EC tablet Take 81 mg by mouth daily.      . Calcium-Vitamin D (RA CALCIUM PLUS VITAMIN D) 600-125 MG-UNIT TABS Take 1 tablet by mouth 2 (two) times daily.     [name] lisinopril (PRINIVIL,ZESTRIL) 10 MG tablet Take 1 tablet (10 mg total) by mouth daily. 90 tablet 3  . metoprolol succinate (TOPROL XL) 25 MG 24 hr tablet Take 1 tablet (25 mg total) by mouth daily. (Patient taking differently: Take 25 mg by mouth at bedtime. ) 90 tablet 4  . omeprazole (PRILOSEC) 20 MG capsule Take 1 capsule (20 mg total) by mouth 2 (two) times daily before a meal. 60 capsule 1  . simvastatin (ZOCOR) 40 MG tablet Take 1 tablet (40 mg total) by mouth at bedtime. 90 tablet 3  . traMADol (ULTRAM) 50 MG tablet Take 1 tablet (50 mg total) by mouth every 6 (six) hours as needed. 90 tablet 1  . vitamin B-12 (CYANOCOBALAMIN) 1000 MCG tablet Take 1,000 mcg by mouth daily.    . vitamin C (ASCORBIC ACID) 500 MG tablet Take 500 mg by mouth daily.    . Zinc 100 MG TABS Take 1 tablet by mouth daily.      [name] zolpidem (AMBIEN) 5 MG tablet Take 1 tablet (5 mg total) by mouth at bedtime as needed for sleep. 30 tablet 5   No current facility-administered medications for this visit.      Allergies:   Patient has no known allergies.   Social  History:  The patient  reports that he quit smoking about 16 years ago. His smoking use included Cigarettes. He has a 100.00 pack-year smoking history. He has never used smokeless tobacco. He reports that he drinks about 6.0 oz of alcohol per week . He reports that he does not use drugs.   Family History:   family history includes Cancer in his paternal grandfather; Diabetes in his father; Heart disease in his mother; Heart disease (age of onset: 60) in his brother; Hypertension in his father and mother.    Review of Systems: Review of Systems  Constitutional: Negative.   Respiratory: Negative.   Cardiovascular: Negative.   Gastrointestinal: Negative.   Musculoskeletal: Negative.   Neurological: Negative.   Psychiatric/Behavioral: Negative.   All other systems reviewed and are negative.    PHYSICAL EXAM: VS:  BP 120/70 (BP Location: Left Arm, Patient Position: Sitting, Cuff Size: Large)   Pulse 60   Ht  $'5\' 8"'b$  (1.727 m)   Wt 204 lb (92.5 kg)   BMI 31.02 kg/m  , BMI Body mass index is 31.02 kg/m. GEN: Well nourished, well developed, in no acute distress  HEENT: normal  Neck: no JVD, carotid bruits, or masses Cardiac: RRR; no murmurs, rubs, or gallops,no edema  Respiratory:  clear to auscultation bilaterally, normal work of breathing GI: soft, nontender, nondistended, + BS MS: no deformity or atrophy  Skin: warm and dry, no rash Neuro:  Strength and sensation are intact Psych: euthymic mood, full affect    Recent Labs: 02/06/2016: ALT 16; BUN 21; Creatinine, Ser 1.11; Hemoglobin 14.3; Platelets 215.0; Potassium 4.2; Sodium 137    Lipid Panel Lab Results  Component Value Date   CHOL 121 08/17/2015   HDL 48.00 08/17/2015   LDLCALC 62 08/17/2015   TRIG 53.0 08/17/2015      Wt Readings from Last 3 Encounters:  04/21/16 204 lb (92.5 kg)  04/07/16 210 lb (95.3 kg)  04/02/16 202 lb (91.6 kg)       ASSESSMENT AND PLAN:  Atherosclerosis of native coronary artery of  native heart without angina pectoris - Plan: EKG 12-Lead Currently with no symptoms of angina. No further workup at this time. Continue current medication regimen.  Paroxysmal atrial fibrillation (HCC) - Plan: EKG 12-Lead Prior history of postop A. fib after bypass surgery No further episodes since that time that he is able to appreciate  PAD (peripheral artery disease) ([place])  Pure hypercholesterolemia Cholesterol is at goal on the current lipid regimen. No changes to the medications were made.  Essential hypertension Blood pressure is well controlled on today's visit. No changes made to the medications.  S/P CABG x 4 No anginal symptoms, active, exercising No further workup at this time   Total encounter time more than 15 minutes  Greater than 50% was spent in counseling and coordination of care with the patient   Disposition:   F/U  6 months   Orders Placed This Encounter  Procedures  . EKG 12-Lead     Signed, Esmond Plants, M.D., Ph.D. 04/21/2016  [place], [place]

## 2016-04-21 NOTE — Patient Instructions (Signed)

## 2016-04-24 ENCOUNTER — Encounter (HOSPITAL_COMMUNITY): Payer: Self-pay | Admitting: *Deleted

## 2016-04-24 ENCOUNTER — Ambulatory Visit (HOSPITAL_COMMUNITY)
Admission: RE | Admit: 2016-04-24 | Discharge: 2016-04-24 | Disposition: A | Discharge status: Home / Self Care | Payer: PPO | Source: Ambulatory Visit | Attending: Internal Medicine | Admitting: Internal Medicine

## 2016-04-24 ENCOUNTER — Ambulatory Visit (HOSPITAL_COMMUNITY): Payer: PPO | Admitting: Anesthesiology

## 2016-04-24 ENCOUNTER — Encounter (HOSPITAL_COMMUNITY)
Admission: RE | Disposition: A | Discharge status: Home / Self Care | Payer: Self-pay | Source: Ambulatory Visit | Attending: Internal Medicine

## 2016-04-24 ENCOUNTER — Telehealth: Payer: Self-pay

## 2016-04-24 DIAGNOSIS — G4730 Sleep apnea, unspecified: Secondary | ICD-10-CM | POA: Insufficient documentation

## 2016-04-24 DIAGNOSIS — Z8719 Personal history of other diseases of the digestive system: Secondary | ICD-10-CM | POA: Diagnosis not present

## 2016-04-24 DIAGNOSIS — I252 Old myocardial infarction: Secondary | ICD-10-CM | POA: Insufficient documentation

## 2016-04-24 DIAGNOSIS — R1310 Dysphagia, unspecified: Secondary | ICD-10-CM | POA: Diagnosis not present

## 2016-04-24 DIAGNOSIS — Q399 Congenital malformation of esophagus, unspecified: Secondary | ICD-10-CM | POA: Insufficient documentation

## 2016-04-24 DIAGNOSIS — Z951 Presence of aortocoronary bypass graft: Secondary | ICD-10-CM | POA: Diagnosis not present

## 2016-04-24 DIAGNOSIS — R911 Solitary pulmonary nodule: Secondary | ICD-10-CM | POA: Insufficient documentation

## 2016-04-24 DIAGNOSIS — I10 Essential (primary) hypertension: Secondary | ICD-10-CM | POA: Diagnosis not present

## 2016-04-24 DIAGNOSIS — I2510 Atherosclerotic heart disease of native coronary artery without angina pectoris: Secondary | ICD-10-CM | POA: Insufficient documentation

## 2016-04-24 DIAGNOSIS — Z87891 Personal history of nicotine dependence: Secondary | ICD-10-CM | POA: Diagnosis not present

## 2016-04-24 DIAGNOSIS — K2100 Gastro-esophageal reflux disease with esophagitis, without bleeding: Secondary | ICD-10-CM

## 2016-04-24 DIAGNOSIS — E785 Hyperlipidemia, unspecified: Secondary | ICD-10-CM | POA: Insufficient documentation

## 2016-04-24 DIAGNOSIS — K224 Dyskinesia of esophagus: Secondary | ICD-10-CM | POA: Insufficient documentation

## 2016-04-24 DIAGNOSIS — R12 Heartburn: Secondary | ICD-10-CM | POA: Diagnosis not present

## 2016-04-24 DIAGNOSIS — G4733 Obstructive sleep apnea (adult) (pediatric): Secondary | ICD-10-CM | POA: Diagnosis not present

## 2016-04-24 HISTORY — PX: ESOPHAGOGASTRODUODENOSCOPY (EGD) WITH PROPOFOL: SHX5813

## 2016-04-24 SURGERY — ESOPHAGOGASTRODUODENOSCOPY (EGD) WITH PROPOFOL
Anesthesia: Monitor Anesthesia Care

## 2016-04-24 MED ORDER — DEXLANSOPRAZOLE 60 MG PO CPDR: 60.0000 mg | DELAYED_RELEASE_CAPSULE | Freq: Every day | ORAL | 3 refills | Status: DC

## 2016-04-24 MED ORDER — ESMOLOL HCL 100 MG/10ML IV SOLN
INTRAVENOUS | Status: DC | PRN
  Administered 2016-04-24: 10 mg via INTRAVENOUS

## 2016-04-24 MED ORDER — LIDOCAINE 2% (20 MG/ML) 5 ML SYRINGE
INTRAMUSCULAR | Status: DC | PRN
  Administered 2016-04-24: 50 mg via INTRAVENOUS

## 2016-04-24 MED ORDER — GLYCOPYRROLATE 0.2 MG/ML IJ SOLN
INTRAMUSCULAR | Status: DC | PRN
  Administered 2016-04-24: 0.2 mg via INTRAVENOUS

## 2016-04-24 MED ORDER — SODIUM CHLORIDE 0.9 % IV SOLN: INTRAVENOUS | Status: DC

## 2016-04-24 MED ORDER — PROPOFOL 10 MG/ML IV BOLUS
INTRAVENOUS | Status: DC | PRN
  Administered 2016-04-24: 10 mg via INTRAVENOUS
  Administered 2016-04-24: 30 mg via INTRAVENOUS
  Administered 2016-04-24 (×2): 20 mg via INTRAVENOUS
  Administered 2016-04-24: 10 mg via INTRAVENOUS

## 2016-04-24 MED ORDER — GLYCOPYRROLATE 0.2 MG/ML IV SOSY
PREFILLED_SYRINGE | INTRAVENOUS | Status: AC
  Filled 2016-04-24: qty 3

## 2016-04-24 MED ORDER — PROPOFOL 500 MG/50ML IV EMUL
INTRAVENOUS | Status: DC | PRN
  Administered 2016-04-24: 100 ug/kg/min via INTRAVENOUS

## 2016-04-24 MED ORDER — PROPOFOL 10 MG/ML IV BOLUS
INTRAVENOUS | Status: AC
  Filled 2016-04-24: qty 40

## 2016-04-24 MED ORDER — LIDOCAINE 2% (20 MG/ML) 5 ML SYRINGE
INTRAMUSCULAR | Status: AC
  Filled 2016-04-24: qty 5

## 2016-04-24 MED ORDER — LACTATED RINGERS IV SOLN
INTRAVENOUS | Status: DC
  Administered 2016-04-24: 10:00:00 via INTRAVENOUS
  Administered 2016-04-24: 1000 mL via INTRAVENOUS

## 2016-04-24 MED ORDER — EPHEDRINE SULFATE-NACL 50-0.9 MG/10ML-% IV SOSY
PREFILLED_SYRINGE | INTRAVENOUS | Status: DC | PRN
  Administered 2016-04-24 (×2): 10 mg via INTRAVENOUS
  Administered 2016-04-24 (×2): 5 mg via INTRAVENOUS

## 2016-04-24 MED ORDER — EPHEDRINE 5 MG/ML INJ
INTRAVENOUS | Status: AC
  Filled 2016-04-24: qty 10

## 2016-04-24 SURGICAL SUPPLY — 15 items

## 2016-04-24 NOTE — Anesthesia Postprocedure Evaluation (Signed)
Anesthesia Post Note  Patient: Randall Ali.  Procedure(s) Performed: Procedure(s) (LRB): ESOPHAGOGASTRODUODENOSCOPY (EGD) WITH PROPOFOL (N/A)  Patient location during evaluation: PACU Anesthesia Type: MAC Level of consciousness: awake and alert Pain management: pain level controlled Vital Signs Assessment: post-procedure vital signs reviewed and stable Respiratory status: spontaneous breathing, nonlabored ventilation, respiratory function stable and patient connected to nasal cannula oxygen Cardiovascular status: stable and blood pressure returned to baseline Anesthetic complications: no    Last Vitals:  Vitals:   04/24/16 1008 04/24/16 1010  BP: (!) 101/52 (!) 101/52  Pulse: 76 80  Resp: 17 19  Temp:  36.4 C    Last Pain:  Vitals:   04/24/16 1010  TempSrc: Oral                 Glenice Ciccone S

## 2016-04-24 NOTE — Transfer of Care (Signed)
Immediate Anesthesia Transfer of Care Note  Patient: Randall Ali.  Procedure(s) Performed: Procedure(s): ESOPHAGOGASTRODUODENOSCOPY (EGD) WITH PROPOFOL (N/A)  Patient Location: PACU  Anesthesia Type:MAC  Level of Consciousness: Patient easily awoken, sedated, comfortable, cooperative, following commands, responds to stimulation.   Airway & Oxygen Therapy: Patient spontaneously breathing, ventilating well, oxygen via simple oxygen mask.  Post-op Assessment: Report given to PACU RN, vital signs reviewed and stable, moving all extremities.   Post vital signs: Reviewed and stable.  Complications: No apparent anesthesia complications  Last Vitals:  Vitals:   04/24/16 0801 04/24/16 1006  BP: 135/66 (!) 86/52  Pulse: 82 77  Resp: 18 18  Temp: 36.7 C     Last Pain:  Vitals:   04/24/16 0801  TempSrc: Oral         Complications: No apparent anesthesia complications

## 2016-04-24 NOTE — Discharge Instructions (Signed)
YOU HAD AN ENDOSCOPIC PROCEDURE TODAY: Refer to the procedure report and other information in the discharge instructions given to you for any specific questions about what was found during the examination. If this information does not answer your questions, please call [place] office at [phone] to clarify.  ° °YOU SHOULD EXPECT: Some feelings of bloating in the abdomen. Passage of more gas than usual. Walking can help get rid of the air that was put into your GI tract during the procedure and reduce the bloating. If you had a lower endoscopy (such as a colonoscopy or flexible sigmoidoscopy) you may notice spotting of blood in your stool or on the toilet paper. Some abdominal soreness may be present for a day or two, also. ° °DIET: Your first meal following the procedure should be a light meal and then it is ok to progress to your normal diet. A half-sandwich or bowl of soup is an example of a good first meal. Heavy or fried foods are harder to digest and may make you feel nauseous or bloated. Drink plenty of fluids but you should avoid alcoholic beverages for 24 hours. If you had a esophageal dilation, please see attached instructions for diet.   ° °ACTIVITY: Your care partner should take you home directly after the procedure. You should plan to take it easy, moving slowly for the rest of the day. You can resume normal activity the day after the procedure however YOU SHOULD NOT DRIVE, use power tools, machinery or perform tasks that involve climbing or major physical exertion for 24 hours (because of the sedation medicines used during the test).  ° °SYMPTOMS TO REPORT IMMEDIATELY: °A gastroenterologist can be reached at any hour. Please call [phone]  for any of the following symptoms:  °Following lower endoscopy (colonoscopy, flexible sigmoidoscopy) °Excessive amounts of blood in the stool  °Significant tenderness, worsening of abdominal pains  °Swelling of the abdomen that is new, acute  °Fever of 100° or  higher  °Following upper endoscopy (EGD, EUS, ERCP, esophageal dilation) °Vomiting of blood or coffee ground material  °New, significant abdominal pain  °New, significant chest pain or pain under the shoulder blades  °Painful or persistently difficult swallowing  °New shortness of breath  °Black, tarry-looking or red, bloody stools ° °FOLLOW UP:  °If any biopsies were taken you will be contacted by phone or by letter within the next 1-3 weeks. Call [phone]  if you have not heard about the biopsies in 3 weeks.  °Please also call with any specific questions about appointments or follow up tests. ° °

## 2016-04-24 NOTE — Anesthesia Preprocedure Evaluation (Signed)
Anesthesia Evaluation  Patient identified by MRN, date of birth, ID band Patient awake    Reviewed: Allergy & Precautions, NPO status , Patient's Chart, lab work & pertinent test results  Airway Mallampati: II  TM Distance: >3 FB Neck ROM: Full    Dental no notable dental hx.    Pulmonary sleep apnea , former smoker,    Pulmonary exam normal breath sounds clear to auscultation       Cardiovascular hypertension, + CAD, + Past MI and + CABG  Normal cardiovascular exam Rhythm:Regular Rate:Normal     Neuro/Psych negative neurological ROS  negative psych ROS   GI/Hepatic negative GI ROS, Neg liver ROS,   Endo/Other  negative endocrine ROS  Renal/GU negative Renal ROS  negative genitourinary   Musculoskeletal negative musculoskeletal ROS (+)   Abdominal   Peds negative pediatric ROS (+)  Hematology negative hematology ROS (+)   Anesthesia Other Findings   Reproductive/Obstetrics negative OB ROS                             Anesthesia Physical Anesthesia Plan  ASA: III  Anesthesia Plan: MAC   Post-op Pain Management:    Induction: Intravenous  Airway Management Planned: Nasal Cannula  Additional Equipment:   Intra-op Plan:   Post-operative Plan:   Informed Consent: I have reviewed the patients History and Physical, chart, labs and discussed the procedure including the risks, benefits and alternatives for the proposed anesthesia with the patient or authorized representative who has indicated his/her understanding and acceptance.   Dental advisory given  Plan Discussed with: CRNA and Surgeon  Anesthesia Plan Comments:         Anesthesia Quick Evaluation

## 2016-04-24 NOTE — Telephone Encounter (Signed)
Script sent to pharmacy.

## 2016-04-24 NOTE — Interval H&P Note (Signed)
History and Physical Interval Note: Here for EGD and possible dilation. Hx of GERD and heartburn has been more troublesome lately and this is worse with liquids. No liquid dysphagia, but for solids this has persisted. The nature of the procedure, as well as the risks, benefits, and alternatives were carefully and thoroughly reviewed with the patient. Ample time for discussion and questions allowed. The patient understood, was satisfied, and agreed to proceed.     04/24/2016 9:15 AM  Randall Ali.  has presented today for surgery, with the diagnosis of gerd, dysphagia, hx esophagitis  The various methods of treatment have been discussed with the patient and family. After consideration of risks, benefits and other options for treatment, the patient has consented to  Procedure(s): ESOPHAGOGASTRODUODENOSCOPY (EGD) WITH PROPOFOL (N/A) as a surgical intervention .  [name] history has been reviewed, patient examined, no change in status, stable for surgery.  I have reviewed the patient's chart and labs.  Questions were answered to the patient's satisfaction.     [name], JAY M

## 2016-04-24 NOTE — H&P (View-Only) (Signed)
Subjective:    Patient ID: Randall Ali., male    DOB: 1941-03-03, 75 y.o.   MRN: [id number]  HPI Randall Ali is a 75 year old male with a past medical history of GERD with reflux esophagitis, mild esophageal dysmotility, CAD, hypertension, hyperlipidemia who seen in follow-up. He was last in the office in April 2016 after being diagnosed with reflux esophagitis. He took omeprazole for some time after that last visit but then discontinue the medication. He still had intermittent issues with solid food dysphagia particularly food such as apples and steak or chicken. Occasionally a large vitamin pill will "hang-up" and feels like it stops at the sternal notch. Over the last few weeks he's had a flare of actual heartburn which for him has been rare. Symptoms became somewhat severe and he called primary care and was advised to get omeprazole 20 mg over-the-counter and take twice a day before meals. He has done this for the last 72 hours and his heartburn is better. He denies nausea and vomiting. His appetite is good. His weight has been stable. His bowel movements regular without blood or melena.  I have reviewed his upper endoscopy which I performed last year as well as barium swallow also performed last year.  Review of Systems As per history of present illness, otherwise negative    Objective:   Physical Exam BP 128/74   Pulse 74   Ht '5\' 8"'$  (1.727 m)   Wt 201 lb (91.2 kg)   BMI 30.56 kg/m  Constitutional: Well-developed and well-nourished. No distress. HEENT: Normocephalic and atraumatic. Oropharynx is clear and moist. No oropharyngeal exudate. Conjunctivae are normal.  No scleral icterus. Neck: Neck supple. Trachea midline. Cardiovascular: Normal rate, regular rhythm and intact distal pulses. No M/R/G Pulmonary/chest: Effort normal and breath sounds normal. No wheezing, rales or rhonchi. Abdominal: Soft, mild tenderness just below xiphoid without rebound or guarding, nondistended.  Bowel sounds active throughout. There are no masses palpable. No hepatosplenomegaly. Diastases recti present Extremities: no clubbing, cyanosis, or edema Lymphadenopathy: No cervical adenopathy noted. Neurological: Alert and oriented to person place and time. Skin: Skin is warm and dry. No rashes noted. Psychiatric: Normal mood and affect. Behavior is normal.  CBC    Component Value Date/Time   WBC 7.9 02/06/2016 1248   RBC 4.64 02/06/2016 1248   HGB 14.3 02/06/2016 1248   HCT 41.8 02/06/2016 1248   HCT 44.2 01/15/2015 0956   PLT 215.0 02/06/2016 1248   PLT 207 01/15/2015 0956   MCV 90.1 02/06/2016 1248   MCV 91 01/15/2015 0956   MCH 29.1 02/12/2015 2114   MCHC 34.2 02/06/2016 1248   RDW 12.5 02/06/2016 1248   RDW 12.9 01/15/2015 0956   LYMPHSABS 2.5 02/06/2016 1248   LYMPHSABS 1.9 01/15/2015 0956   MONOABS 0.8 02/06/2016 1248   EOSABS 0.1 02/06/2016 1248   EOSABS 0.2 01/15/2015 0956   BASOSABS 0.0 02/06/2016 1248   BASOSABS 0.0 01/15/2015 0956   CMP     Component Value Date/Time   NA 137 02/06/2016 1248   NA 138 01/15/2015 0956   K 4.2 02/06/2016 1248   CL 104 02/06/2016 1248   CO2 27 02/06/2016 1248   GLUCOSE 88 02/06/2016 1248   BUN 21 02/06/2016 1248   BUN 16 01/15/2015 0956   CREATININE 1.11 02/06/2016 1248   CALCIUM 9.0 02/06/2016 1248   PROT 6.4 02/06/2016 1248   ALBUMIN 4.3 02/06/2016 1248   AST 15 02/06/2016 1248   ALT 16 02/06/2016 1248  ALKPHOS 51 02/06/2016 1248   BILITOT 0.6 02/06/2016 1248   GFRNONAA >60 02/12/2015 2114   GFRAA >60 02/12/2015 2114       Assessment & Plan:  75 year old male with a past medical history of GERD with reflux esophagitis, mild esophageal dysmotility, CAD, hypertension, hyperlipidemia who seen in follow-up.   1. GERD with hx of esophagitis/dysphagia -- Flare of reflux recently improved with twice a day omeprazole. His dysphagia has been persistent and somewhat worsened since last seeing me. He very well may have a  stricture at the base of his esophagus due to history of esophagitis. I recommended we repeat upper endoscopy and consider possible dilation depending on findings. We discussed the risk, benefit and alternative and he wishes to proceed. I would like him to continue omeprazole 20 mg twice a day before meals until December 1 and then reduce to once daily dosing. GERD diet reviewed including reducing caffeine.  2. Diastasis recti -- informed him as to this condition and that it is not a hernia. Reassurance provided  3. History of pulmonary nodule -- he inquires about the need for surveillance CT scan. I reviewed the chest CT from August 2016 which showed a 7 mm right upper lobe pulmonary nodule which was stable. I will defer this to primary care.  25 minutes spent with the patient today. Greater than 50% was spent in counseling and coordination of care with the patient

## 2016-04-24 NOTE — Telephone Encounter (Signed)
-----   Message from Jerene Bears, MD sent at 04/24/2016 10:27 AM EST ----- Regarding: dexilant Hey Once this pt leaves endo will you replace omep with dexilant 60 q day Hopefully it will be covered He is have heartburn despite omep and tried panto in the past also Thanks JMP

## 2016-04-24 NOTE — Op Note (Signed)
[facility] Patient Name: Randall Ali Procedure Date: 04/24/2016 MRN: [id number] Attending MD: Jerene Bears , MD Date of Birth: 18-Apr-1941 CSN: [id number] Age: 75 Admit Type: Outpatient Procedure:                Upper GI endoscopy Indications:              Dysphagia, Heartburn, history of reflux esophagitis Providers:                Lajuan Lines. Hilarie Fredrickson, MD, Zenon Mayo, RN, Alfonso Patten,                            Technician, Heide Scales, CRNA Referring MD:              Medicines:                Monitored Anesthesia Care Complications:            No immediate complications. Estimated Blood Loss:     Estimated blood loss: none. Procedure:                Pre-Anesthesia Assessment:                           - Prior to the procedure, a History and Physical                            was performed, and patient medications and                            allergies were reviewed. [name] tolerance of                            previous anesthesia was also reviewed. The risks                            and benefits of the procedure and the sedation                            options and risks were discussed with the patient.                            All questions were answered, and informed consent                            was obtained. Prior Anticoagulants: The patient has                            taken no previous anticoagulant or antiplatelet                            agents. ASA Grade Assessment: III - A patient with                            severe systemic disease. After reviewing the risks  and benefits, the patient was deemed in                            satisfactory condition to undergo the procedure.                           After obtaining informed consent, the endoscope was                            passed under direct vision. Throughout the                            procedure, the patient's blood pressure, pulse, and                       oxygen saturations were monitored continuously. The                            [id number]) scope was introduced through the                            mouth, and advanced to the second part of duodenum.                            The upper GI endoscopy was accomplished without                            difficulty. The patient tolerated the procedure                            well. Scope In: Scope Out: Findings:      The middle third of the esophagus and lower third of the esophagus were       mildly tortuous.      Esophagogastric landmarks were identified: the Z-line was found at 41 cm       from the incisors and regular. No evidence of esophagitis today. The       previously seen ulceration at the GE junction has healed.      A TTS dilator was passed through the scope. Dilation with a 15-16.5-18       mm balloon dilator was performed to 18 mm in the lower esophagus across       the gastroesophageal junction (ballon inflated for 60 seconds under       direct visualization with post-dilation inspection revealing not obvious       change).      The entire examined stomach was normal.      The examined duodenum was normal. Impression:               - Mildly tortuous esophagus suggesting dysmotility.                            Normal esophageal mucosa.                           - Esophagogastric landmarks identified with regular  Z-line at 41 cm. Dilation performed across GE                            junction to 18 mm.                           - Normal stomach.                           - Normal examined duodenum.                           - No specimens collected. Moderate Sedation:      N/A Recommendation:           - Patient has a contact number available for                            emergencies. The signs and symptoms of potential                            delayed complications were discussed with the                             patient. Return to normal activities tomorrow.                            Written discharge instructions were provided to the                            patient.                           - Resume previous diet.                           - Continue present medications except change to                            Dexilant 60 mg daily (in place of omeprazole given                            uncontrolled heartburn). Can use over the counter                            Gaviscon as needed for breakthrough heartburn.                           - If trouble swallowing persists consider                            esophageal manometry (prior esophagram suggested                            dysmotility). Procedure Code(s):        --- Professional ---  [phone], Esophagogastroduodenoscopy, flexible,                            transoral; with transendoscopic balloon dilation of                            esophagus (less than 30 mm diameter) Diagnosis Code(s):        --- Professional ---                           Q39.9, Congenital malformation of esophagus,                            unspecified                           R13.10, Dysphagia, unspecified                           R12, Heartburn                           K21.0, Gastro-esophageal reflux disease with                            esophagitis CPT copyright 2016 American Medical Association. All rights reserved. The codes documented in this report are preliminary and upon coder review may  be revised to meet current compliance requirements. Jerene Bears, MD 04/24/2016 10:23:45 AM This report has been signed electronically. Number of Addenda: 0

## 2016-04-25 ENCOUNTER — Encounter (HOSPITAL_COMMUNITY): Payer: Self-pay | Admitting: Internal Medicine

## 2016-04-28 DIAGNOSIS — M5432 Sciatica, left side: Secondary | ICD-10-CM | POA: Diagnosis not present

## 2016-04-28 DIAGNOSIS — M9903 Segmental and somatic dysfunction of lumbar region: Secondary | ICD-10-CM | POA: Diagnosis not present

## 2016-04-28 DIAGNOSIS — M9901 Segmental and somatic dysfunction of cervical region: Secondary | ICD-10-CM | POA: Diagnosis not present

## 2016-04-28 DIAGNOSIS — M5033 Other cervical disc degeneration, cervicothoracic region: Secondary | ICD-10-CM | POA: Diagnosis not present

## 2016-04-30 DIAGNOSIS — M9901 Segmental and somatic dysfunction of cervical region: Secondary | ICD-10-CM | POA: Diagnosis not present

## 2016-04-30 DIAGNOSIS — M9903 Segmental and somatic dysfunction of lumbar region: Secondary | ICD-10-CM | POA: Diagnosis not present

## 2016-04-30 DIAGNOSIS — M5033 Other cervical disc degeneration, cervicothoracic region: Secondary | ICD-10-CM | POA: Diagnosis not present

## 2016-04-30 DIAGNOSIS — M5432 Sciatica, left side: Secondary | ICD-10-CM | POA: Diagnosis not present

## 2016-05-06 DIAGNOSIS — M5432 Sciatica, left side: Secondary | ICD-10-CM | POA: Diagnosis not present

## 2016-05-06 DIAGNOSIS — M9901 Segmental and somatic dysfunction of cervical region: Secondary | ICD-10-CM | POA: Diagnosis not present

## 2016-05-06 DIAGNOSIS — M9903 Segmental and somatic dysfunction of lumbar region: Secondary | ICD-10-CM | POA: Diagnosis not present

## 2016-05-06 DIAGNOSIS — M5033 Other cervical disc degeneration, cervicothoracic region: Secondary | ICD-10-CM | POA: Diagnosis not present

## 2016-05-09 DIAGNOSIS — M5033 Other cervical disc degeneration, cervicothoracic region: Secondary | ICD-10-CM | POA: Diagnosis not present

## 2016-05-09 DIAGNOSIS — M9903 Segmental and somatic dysfunction of lumbar region: Secondary | ICD-10-CM | POA: Diagnosis not present

## 2016-05-09 DIAGNOSIS — M5432 Sciatica, left side: Secondary | ICD-10-CM | POA: Diagnosis not present

## 2016-05-09 DIAGNOSIS — M9901 Segmental and somatic dysfunction of cervical region: Secondary | ICD-10-CM | POA: Diagnosis not present

## 2016-05-11 ENCOUNTER — Telehealth: Payer: Self-pay | Admitting: Internal Medicine

## 2016-05-11 DIAGNOSIS — G4733 Obstructive sleep apnea (adult) (pediatric): Secondary | ICD-10-CM

## 2016-05-11 NOTE — Telephone Encounter (Signed)
CPAP compliance report received for period oct 23 to nov 23 .  100% compliance reported

## 2016-05-11 NOTE — Assessment & Plan Note (Signed)
CPAP compliance report received for period oct 23 to nov 23 .  100% compliance reported

## 2016-05-12 DIAGNOSIS — M9903 Segmental and somatic dysfunction of lumbar region: Secondary | ICD-10-CM | POA: Diagnosis not present

## 2016-05-12 DIAGNOSIS — M9901 Segmental and somatic dysfunction of cervical region: Secondary | ICD-10-CM | POA: Diagnosis not present

## 2016-05-12 DIAGNOSIS — M5033 Other cervical disc degeneration, cervicothoracic region: Secondary | ICD-10-CM | POA: Diagnosis not present

## 2016-05-12 DIAGNOSIS — M5432 Sciatica, left side: Secondary | ICD-10-CM | POA: Diagnosis not present

## 2016-05-15 DIAGNOSIS — M9901 Segmental and somatic dysfunction of cervical region: Secondary | ICD-10-CM | POA: Diagnosis not present

## 2016-05-15 DIAGNOSIS — M5033 Other cervical disc degeneration, cervicothoracic region: Secondary | ICD-10-CM | POA: Diagnosis not present

## 2016-05-15 DIAGNOSIS — M5432 Sciatica, left side: Secondary | ICD-10-CM | POA: Diagnosis not present

## 2016-05-15 DIAGNOSIS — M9903 Segmental and somatic dysfunction of lumbar region: Secondary | ICD-10-CM | POA: Diagnosis not present

## 2016-05-16 ENCOUNTER — Telehealth: Payer: Self-pay | Admitting: Internal Medicine

## 2016-05-19 NOTE — Telephone Encounter (Signed)
Pt states he is still having issues with reflux. Reports Friday he had 5 episodes of heartburn. He states he does not think the Dexilant '60mg'$  daily is helping at all. Pt thinks the prilosec '20mg'$  BID was working better than the [employer]. Please advise.

## 2016-05-20 MED ORDER — OMEPRAZOLE 40 MG PO CPDR: 40.0000 mg | DELAYED_RELEASE_CAPSULE | Freq: Two times a day (BID) | ORAL | 3 refills | Status: DC

## 2016-05-20 NOTE — Telephone Encounter (Signed)
Return to omeprazole but at 40 mg BID-AC Have him call if symptoms remain/persist

## 2016-05-20 NOTE — Telephone Encounter (Signed)
Spoke with pt and he is aware, script sent to pharmacy. 

## 2016-05-24 DIAGNOSIS — G4733 Obstructive sleep apnea (adult) (pediatric): Secondary | ICD-10-CM | POA: Diagnosis not present

## 2016-05-27 DIAGNOSIS — M5416 Radiculopathy, lumbar region: Secondary | ICD-10-CM | POA: Diagnosis not present

## 2016-05-29 ENCOUNTER — Ambulatory Visit: Payer: PPO | Admitting: Primary Care

## 2016-06-16 ENCOUNTER — Telehealth: Payer: Self-pay

## 2016-06-16 MED ORDER — NITROGLYCERIN 0.4 MG SL SUBL: 0.4000 mg | SUBLINGUAL_TABLET | SUBLINGUAL | 3 refills | Status: DC | PRN

## 2016-06-16 NOTE — Telephone Encounter (Signed)
This was previously sent in by our office - not sure why it fell off his list. Refill sent in.

## 2016-06-16 NOTE — Telephone Encounter (Signed)
Randall Ali is calling for a refill on Nitrostat 0.4 mg SL tablets. The Nitrostat is not on the pt's. Medication list. Please advise if okay to refill.

## 2016-06-20 DIAGNOSIS — M5033 Other cervical disc degeneration, cervicothoracic region: Secondary | ICD-10-CM | POA: Diagnosis not present

## 2016-06-20 DIAGNOSIS — M5432 Sciatica, left side: Secondary | ICD-10-CM | POA: Diagnosis not present

## 2016-06-20 DIAGNOSIS — M9901 Segmental and somatic dysfunction of cervical region: Secondary | ICD-10-CM | POA: Diagnosis not present

## 2016-06-20 DIAGNOSIS — M9903 Segmental and somatic dysfunction of lumbar region: Secondary | ICD-10-CM | POA: Diagnosis not present

## 2016-06-24 DIAGNOSIS — G4733 Obstructive sleep apnea (adult) (pediatric): Secondary | ICD-10-CM | POA: Diagnosis not present

## 2016-07-05 DIAGNOSIS — M9903 Segmental and somatic dysfunction of lumbar region: Secondary | ICD-10-CM | POA: Diagnosis not present

## 2016-07-05 DIAGNOSIS — M9901 Segmental and somatic dysfunction of cervical region: Secondary | ICD-10-CM | POA: Diagnosis not present

## 2016-07-05 DIAGNOSIS — M5033 Other cervical disc degeneration, cervicothoracic region: Secondary | ICD-10-CM | POA: Diagnosis not present

## 2016-07-05 DIAGNOSIS — M5432 Sciatica, left side: Secondary | ICD-10-CM | POA: Diagnosis not present

## 2016-07-07 DIAGNOSIS — M9901 Segmental and somatic dysfunction of cervical region: Secondary | ICD-10-CM | POA: Diagnosis not present

## 2016-07-07 DIAGNOSIS — M9903 Segmental and somatic dysfunction of lumbar region: Secondary | ICD-10-CM | POA: Diagnosis not present

## 2016-07-07 DIAGNOSIS — M5033 Other cervical disc degeneration, cervicothoracic region: Secondary | ICD-10-CM | POA: Diagnosis not present

## 2016-07-07 DIAGNOSIS — M5432 Sciatica, left side: Secondary | ICD-10-CM | POA: Diagnosis not present

## 2016-07-09 DIAGNOSIS — M9901 Segmental and somatic dysfunction of cervical region: Secondary | ICD-10-CM | POA: Diagnosis not present

## 2016-07-09 DIAGNOSIS — M9903 Segmental and somatic dysfunction of lumbar region: Secondary | ICD-10-CM | POA: Diagnosis not present

## 2016-07-09 DIAGNOSIS — M5432 Sciatica, left side: Secondary | ICD-10-CM | POA: Diagnosis not present

## 2016-07-09 DIAGNOSIS — M5033 Other cervical disc degeneration, cervicothoracic region: Secondary | ICD-10-CM | POA: Diagnosis not present

## 2016-07-14 ENCOUNTER — Ambulatory Visit (INDEPENDENT_AMBULATORY_CARE_PROVIDER_SITE_OTHER): Payer: PPO

## 2016-07-14 VITALS — BP 118/70 | HR 70 | Temp 97.7°F | Ht 67.75 in | Wt 204.5 lb

## 2016-07-14 DIAGNOSIS — E784 Other hyperlipidemia: Secondary | ICD-10-CM

## 2016-07-14 DIAGNOSIS — I10 Essential (primary) hypertension: Secondary | ICD-10-CM | POA: Diagnosis not present

## 2016-07-14 DIAGNOSIS — Z125 Encounter for screening for malignant neoplasm of prostate: Secondary | ICD-10-CM | POA: Diagnosis not present

## 2016-07-14 DIAGNOSIS — E7849 Other hyperlipidemia: Secondary | ICD-10-CM

## 2016-07-14 DIAGNOSIS — Z0000 Encounter for general adult medical examination without abnormal findings: Secondary | ICD-10-CM

## 2016-07-14 LAB — BASIC METABOLIC PANEL
BUN: 17 mg/dL (ref 6–23)
CO2: 28 mEq/L (ref 19–32)
Calcium: 9.7 mg/dL (ref 8.4–10.5)
Chloride: 105 mEq/L (ref 96–112)
Creatinine, Ser: 0.99 mg/dL (ref 0.40–1.50)
GFR: 78.46 mL/min (ref >60.00)
Glucose, Bld: 108 mg/dL — ABNORMAL HIGH (ref 70–99)
Potassium: 4.8 mEq/L (ref 3.5–5.1)
Sodium: 141 mEq/L (ref 135–145)

## 2016-07-14 LAB — LIPID PANEL
Cholesterol: 127 mg/dL (ref 0–200)
HDL: 47.3 mg/dL (ref >39.00)
LDL Cholesterol: 66 mg/dL (ref 0–99)
NonHDL: 79.36
Total CHOL/HDL Ratio: 3
Triglycerides: 65 mg/dL (ref 0.0–149.0)
VLDL: 13 mg/dL (ref 0.0–40.0)

## 2016-07-14 LAB — PSA, MEDICARE: PSA: 0.37 ng/ml (ref 0.10–4.00)

## 2016-07-14 NOTE — Progress Notes (Signed)
PCP notes:   Health maintenance:  No gaps identified. Health maintenance schedule reviewed with patient.   Abnormal screenings:   Hearing - failed Mini-Cog score: 19/20  Patient concerns:   Pt wants to discuss sleep management with PCP. Pt states he does not have a MD to monitor OSA and CPAP machine. Pt states he would like Ambien increased to 10 mg instead of 5 mg.  Pt is having concerns with right foot pain. Pain level is 7-8/10 resting and increases when walking. Pt is apprehensive about additional surgery at this time.   Nurse concerns:  None  Next PCP appt:   07/28/16 @ 0930

## 2016-07-14 NOTE — Progress Notes (Signed)
Pre visit review using our clinic review tool, if applicable. No additional management support is needed unless otherwise documented below in the visit note. 

## 2016-07-14 NOTE — Patient Instructions (Signed)
Mr. Enke , Thank you for taking time to come for your Medicare Wellness Visit. I appreciate your ongoing commitment to your health goals. Please review the following plan we discussed and let me know if I can assist you in the future.   These are the goals we discussed: Goals    . Increase physical activity          Starting 07/14/16, I will continue to exercise at least 60 min 4 days per week.        This is a list of the screening recommended for you and due dates:  Health Maintenance  Topic Date Due  . Colon Cancer Screening  08/23/2024  . DTaP/Tdap/Td vaccine (2 - Td) 04/17/2025  . Tetanus Vaccine  04/17/2025  . Flu Shot  Completed  . Shingles Vaccine  Completed  . Pneumonia vaccines  Completed   Preventive Care for Adults  A healthy lifestyle and preventive care can promote health and wellness. Preventive health guidelines for adults include the following key practices.  . A routine yearly physical is a good way to check with your health care provider about your health and preventive screening. It is a chance to share any concerns and updates on your health and to receive a thorough exam.  . Visit your dentist for a routine exam and preventive care every 6 months. Brush your teeth twice a day and floss once a day. Good oral hygiene prevents tooth decay and gum disease.  . The frequency of eye exams is based on your age, health, family medical history, use  of contact lenses, and other factors. Follow your health care provider's ecommendations for frequency of eye exams.  . Eat a healthy diet. Foods like vegetables, fruits, whole grains, low-fat dairy products, and lean protein foods contain the nutrients you need without too many calories. Decrease your intake of foods high in solid fats, added sugars, and salt. Eat the right amount of calories for you. Get information about a proper diet from your health care provider, if necessary.  . Regular physical exercise is one of the  most important things you can do for your health. Most adults should get at least 150 minutes of moderate-intensity exercise (any activity that increases your heart rate and causes you to sweat) each week. In addition, most adults need muscle-strengthening exercises on 2 or more days a week.  Silver Sneakers may be a benefit available to you. To determine eligibility, you may visit the website: www.silversneakers.com or contact program at 715-080-4543 Mon-Fri between 8AM-8PM.   . Maintain a healthy weight. The body mass index (BMI) is a screening tool to identify possible weight problems. It provides an estimate of body fat based on height and weight. Your health care provider can find your BMI and can help you achieve or maintain a healthy weight.   For adults 20 years and older: ? A BMI below 18.5 is considered underweight. ? A BMI of 18.5 to 24.9 is normal. ? A BMI of 25 to 29.9 is considered overweight. ? A BMI of 30 and above is considered obese.   . Maintain normal blood lipids and cholesterol levels by exercising and minimizing your intake of saturated fat. Eat a balanced diet with plenty of fruit and vegetables. Blood tests for lipids and cholesterol should begin at age 33 and be repeated every 5 years. If your lipid or cholesterol levels are high, you are over 50, or you are at high risk for heart disease, you  may need your cholesterol levels checked more frequently. Ongoing high lipid and cholesterol levels should be treated with medicines if diet and exercise are not working.  . If you smoke, find out from your health care provider how to quit. If you do not use tobacco, please do not start.  . If you choose to drink alcohol, please do not consume more than 2 drinks per day. One drink is considered to be 12 ounces (355 mL) of beer, 5 ounces (148 mL) of wine, or 1.5 ounces (44 mL) of liquor.  . If you are 59-72 years old, ask your health care provider if you should take aspirin to  prevent strokes.  . Use sunscreen. Apply sunscreen liberally and repeatedly throughout the day. You should seek shade when your shadow is shorter than you. Protect yourself by wearing long sleeves, pants, a wide-brimmed hat, and sunglasses year round, whenever you are outdoors.  . Once a month, do a whole body skin exam, using a mirror to look at the skin on your back. Tell your health care provider of new moles, moles that have irregular borders, moles that are larger than a pencil eraser, or moles that have changed in shape or color.

## 2016-07-14 NOTE — Progress Notes (Signed)
Subjective:   Randall Ali. is a 76 y.o. male who presents for Medicare Annual/Subsequent preventive examination.  Review of Systems:  N/A Cardiac Risk Factors include: advanced age (>42mn, >>56women);dyslipidemia;hypertension;male gender     Objective:    Vitals: BP 118/70 (BP Location: Right Arm, Patient Position: Sitting, Cuff Size: Large)   Pulse 70   Temp 97.7 F (36.5 C) (Oral)   Ht 5' 7.75" (1.721 m) Comment: no shoes  Wt 204 lb 8 oz (92.8 kg)   SpO2 93%   BMI 31.32 kg/m   Body mass index is 31.32 kg/m.  Tobacco History  Smoking Status  . Former Smoker  . Packs/day: 2.50  . Years: 40.00  . Types: Cigarettes  . Quit date: 03/24/2000  Smokeless Tobacco  . Never Used     Counseling given: No   Past Medical History:  Diagnosis Date  . Allergy    seasonal  . Arthritis    all over- in general   . CAD (coronary artery disease)    s/p inferior wall infarct in 10/01. has stent in Rt coronary artery. is due a stress myoview. does have some dyspnea on exertion  . Cancer (HCC)    skin, melanoma  . Cataract    removed  . Chest pain   . Erectile dysfunction   . GERD (gastroesophageal reflux disease)    pt. denies   . HLD (hyperlipidemia)    isdue followup lipids. on zocor 10 mg/day   . HTN (hypertension)   . 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 OSA (obstructive sleep apnea) 01/13/2015  . Postoperative wound infection 02/02/2015  . Reflux esophagitis   . Sleep apnea 2017   CPAP at night  . SOB (shortness of breath) on exertion    Past Surgical History:  Procedure Laterality Date  . arm surgery  2010  . CARDIAC CATHETERIZATION  06/24/11  . CARDIAC CATHETERIZATION N/A 01/18/2015   Procedure: Left Heart Cath with coronary angiography;  Surgeon: TMinna Merritts MD;  [facility];  Service: Cardiovascular;  Laterality: N/A;  . CARDIAC CATHETERIZATION N/A 01/18/2015   Procedure: Intravascular Pressure Wire/FFR  Study;  Surgeon: MWellington Hampshire MD;  [facility];  Service: Cardiovascular;  Laterality: N/A;  . CAROTID STENT  03/10/2011  . COLONOSCOPY  2010  . CORONARY ARTERY BYPASS GRAFT N/A 01/24/2015   Procedure: CORONARY ARTERY BYPASS GRAFTING x 4 (LIMA-LAD, SVG-Int 1- Int 2, SVG-PD) ENDOSCOPIC GREATER SAPHENOUS VEIN HARVEST LEFT LEG;  Surgeon: EGrace Isaac MD;  [facility]  Service: Open Heart Surgery;  Laterality: N/A;  . ESOPHAGOGASTRODUODENOSCOPY (EGD) WITH PROPOFOL N/A 04/24/2016   Procedure: ESOPHAGOGASTRODUODENOSCOPY (EGD) WITH PROPOFOL;  Surgeon: JJerene Bears MD;  Location: [facility];  Service: Gastroenterology;  Laterality: N/A;  . EYE SURGERY     lasik 15 yrs. ago, cataracts removed - both eyes   . HAMMER TOE SURGERY     right toe  . NASAL SINUS SURGERY  2008   septpolasty, bilateral turbinate reduction  . SHOULDER ARTHROSCOPY  2012  . TEE WITHOUT CARDIOVERSION N/A 01/24/2015   Procedure: TRANSESOPHAGEAL ECHOCARDIOGRAM (TEE);  Surgeon: EGrace Isaac MD;  [facility]  Service: Open Heart Surgery;  Laterality: N/A;  . [place] . WRIST SURGERY  2011   Family History  Problem Relation Age of Onset  . Hypertension Mother   . Heart disease Mother   . Hypertension  Father   . Diabetes Father   . Heart disease Brother 47  . Cancer Paternal Grandfather   . Colon cancer Neg Hx    History  Sexual Activity  . Sexual activity: Yes    Outpatient Encounter Prescriptions as of 07/14/2016  Medication Sig  . aspirin 81 MG EC tablet Take 81 mg by mouth daily.    . Calcium-Vitamin D (RA CALCIUM PLUS VITAMIN D) 600-125 MG-UNIT TABS Take 1 tablet by mouth 2 (two) times daily.   [name] dexlansoprazole (DEXILANT) 60 MG capsule Take 1 capsule (60 mg total) by mouth daily.  [name] lisinopril (PRINIVIL,ZESTRIL) 10 MG tablet Take 1 tablet (10 mg total) by mouth daily.  . metoprolol succinate (TOPROL XL) 25 MG 24 hr tablet Take 1 tablet (25 mg total) by mouth  daily.  . nitroGLYCERIN (NITROSTAT) 0.4 MG SL tablet Place 1 tablet (0.4 mg total) under the tongue every 5 (five) minutes as needed for chest pain.  . simvastatin (ZOCOR) 40 MG tablet Take 1 tablet (40 mg total) by mouth at bedtime.  . traMADol (ULTRAM) 50 MG tablet Take 1 tablet (50 mg total) by mouth every 6 (six) hours as needed.  . vitamin B-12 (CYANOCOBALAMIN) 1000 MCG tablet Take 1,000 mcg by mouth daily.  . vitamin C (ASCORBIC ACID) 500 MG tablet Take 500 mg by mouth daily.  . Zinc 100 MG TABS Take 1 tablet by mouth daily.    [name] zolpidem (AMBIEN) 5 MG tablet Take 1 tablet (5 mg total) by mouth at bedtime as needed for sleep.  . [DISCONTINUED] omeprazole (PRILOSEC) 20 MG capsule Take 1 capsule (20 mg total) by mouth 2 (two) times daily before a meal.  . [DISCONTINUED] omeprazole (PRILOSEC) 40 MG capsule Take 1 capsule (40 mg total) by mouth 2 (two) times daily.   No facility-administered encounter medications on file as of 07/14/2016.     Activities of Daily Living In your present state of health, do you have any difficulty performing the following activities: 07/14/2016  Hearing? Y  Vision? N  Difficulty concentrating or making decisions? N  Walking or climbing stairs? N  Dressing or bathing? N  Doing errands, shopping? N  Preparing Food and eating ? N  Using the Toilet? N  In the past six months, have you accidently leaked urine? N  Do you have problems with loss of bowel control? N  Managing your Medications? N  Managing your Finances? N  Housekeeping or managing your Housekeeping? N  Some recent data might be hidden    Patient Care Team: Ria Bush, MD as PCP - General (Family Medicine) [name], MD (Internal Medicine) Minna Merritts, MD as Consulting Physician (Cardiology)   Assessment:      Hearing Screening   '125Hz'$  '250Hz'$  '500Hz'$  '1000Hz'$  '2000Hz'$  '3000Hz'$  '4000Hz'$  '6000Hz'$  '8000Hz'$   Right ear:   40 0 0  0    Left ear:   40 0 0  0    Vision Screening Comments: Last  vision exam in 2017 with Joan Mayans    Exercise Activities and Dietary recommendations Current Exercise Habits: Home exercise routine (pt also plays softball), Type of exercise: strength training/weights;Other - see comments (stationary bike), Time (Minutes): 60, Frequency (Times/Week): 4, Weekly Exercise (Minutes/Week): 240, Intensity: Moderate, Exercise limited by: None identified  Goals    . Increase physical activity          Starting 07/14/16, I will continue to exercise at least 60 min 4 days per week.  Fall Risk Fall Risk  07/14/2016 02/06/2016 10/08/2015 05/10/2015 03/28/2015  Falls in the past year? No No No No No   Depression Screen PHQ 2/9 Scores 07/14/2016 02/06/2016 10/08/2015 05/10/2015  PHQ - 2 Score 0 0 0 0  PHQ- 9 Score - - - -    Cognitive Function MMSE - Mini Mental State Exam 07/14/2016  Orientation to time 5  Orientation to Place 5  Registration 3  Attention/ Calculation 0  Recall 2  Recall-comments pt was unable to recall 1 of 3 words  Language- name 2 objects 0  Language- repeat 1  Language- follow 3 step command 3  Language- read & follow direction 0  Write a sentence 0  Copy design 0  Total score 19     PLEASE NOTE: A Mini-Cog screen was completed. Maximum score is 20. A value of 0 denotes this part of Folstein MMSE was not completed or the patient failed this part of the Mini-Cog screening.   Mini-Cog Screening Orientation to Time - Max 5 pts Orientation to Place - Max 5 pts Registration - Max 3 pts Recall - Max 3 pts Language Repeat - Max 1 pts Language Follow 3 Step Command - Max 3 pts     Immunization History  Administered Date(s) Administered  . Influenza Split 02/27/2012, 04/10/2014  . Influenza, High Dose Seasonal PF 04/05/2013, 02/08/2016  . Influenza,inj,Quad PF,36+ Mos 03/22/2015  . Pneumococcal Conjugate-13 04/13/2015  . Pneumococcal Polysaccharide-23 02/26/2005, 02/27/2012  . Tdap 04/18/2015  . Zoster 04/06/2011   Screening  Tests Health Maintenance  Topic Date Due  . COLONOSCOPY  08/23/2024  . DTaP/Tdap/Td (2 - Td) 04/17/2025  . TETANUS/TDAP  04/17/2025  . INFLUENZA VACCINE  Completed  . ZOSTAVAX  Completed  . PNA vac Low Risk Adult  Completed      Plan:     I have personally reviewed and addressed the Medicare Annual Wellness questionnaire and have noted the following in the patient's chart:  A. Medical and social history B. Use of alcohol, tobacco or illicit drugs  C. Current medications and supplements D. Functional ability and status E.  Nutritional status F.  Physical activity G. Advance directives H. List of other physicians I.  Hospitalizations, surgeries, and ER visits in previous 12 months J.  [place] to include hearing, vision, cognitive, depression L. Referrals and appointments - none  In addition, I have reviewed and discussed with patient certain preventive protocols, quality metrics, and best practice recommendations. A written personalized care plan for preventive services as well as general preventive health recommendations were provided to patient.  See attached scanned questionnaire for additional information.   Signed,   Lindell Noe, MHA, BS, LPN Health Coach

## 2016-07-15 NOTE — Progress Notes (Signed)
I reviewed health advisor's note, was available for consultation, and agree with documentation and plan.  

## 2016-07-18 DIAGNOSIS — M9901 Segmental and somatic dysfunction of cervical region: Secondary | ICD-10-CM | POA: Diagnosis not present

## 2016-07-18 DIAGNOSIS — M9903 Segmental and somatic dysfunction of lumbar region: Secondary | ICD-10-CM | POA: Diagnosis not present

## 2016-07-18 DIAGNOSIS — M5033 Other cervical disc degeneration, cervicothoracic region: Secondary | ICD-10-CM | POA: Diagnosis not present

## 2016-07-18 DIAGNOSIS — M5432 Sciatica, left side: Secondary | ICD-10-CM | POA: Diagnosis not present

## 2016-07-23 DIAGNOSIS — M5136 Other intervertebral disc degeneration, lumbar region: Secondary | ICD-10-CM | POA: Diagnosis not present

## 2016-07-23 DIAGNOSIS — M48061 Spinal stenosis, lumbar region without neurogenic claudication: Secondary | ICD-10-CM | POA: Diagnosis not present

## 2016-07-23 DIAGNOSIS — M545 Low back pain: Secondary | ICD-10-CM | POA: Diagnosis not present

## 2016-07-25 DIAGNOSIS — G4733 Obstructive sleep apnea (adult) (pediatric): Secondary | ICD-10-CM | POA: Diagnosis not present

## 2016-07-28 ENCOUNTER — Ambulatory Visit (INDEPENDENT_AMBULATORY_CARE_PROVIDER_SITE_OTHER): Payer: PPO | Admitting: Family Medicine

## 2016-07-28 ENCOUNTER — Encounter: Payer: Self-pay | Admitting: Family Medicine

## 2016-07-28 VITALS — BP 104/62 | HR 62 | Temp 97.6°F | Ht 68.75 in | Wt 207.0 lb

## 2016-07-28 DIAGNOSIS — Z951 Presence of aortocoronary bypass graft: Secondary | ICD-10-CM

## 2016-07-28 DIAGNOSIS — I10 Essential (primary) hypertension: Secondary | ICD-10-CM | POA: Diagnosis not present

## 2016-07-28 DIAGNOSIS — Z0001 Encounter for general adult medical examination with abnormal findings: Secondary | ICD-10-CM | POA: Insufficient documentation

## 2016-07-28 DIAGNOSIS — G4733 Obstructive sleep apnea (adult) (pediatric): Secondary | ICD-10-CM

## 2016-07-28 DIAGNOSIS — Z0000 Encounter for general adult medical examination without abnormal findings: Secondary | ICD-10-CM | POA: Insufficient documentation

## 2016-07-28 DIAGNOSIS — I739 Peripheral vascular disease, unspecified: Secondary | ICD-10-CM

## 2016-07-28 DIAGNOSIS — H9193 Unspecified hearing loss, bilateral: Secondary | ICD-10-CM

## 2016-07-28 DIAGNOSIS — E7800 Pure hypercholesterolemia, unspecified: Secondary | ICD-10-CM

## 2016-07-28 DIAGNOSIS — H9190 Unspecified hearing loss, unspecified ear: Secondary | ICD-10-CM | POA: Insufficient documentation

## 2016-07-28 DIAGNOSIS — I480 Paroxysmal atrial fibrillation: Secondary | ICD-10-CM | POA: Diagnosis not present

## 2016-07-28 DIAGNOSIS — Z7189 Other specified counseling: Secondary | ICD-10-CM | POA: Diagnosis not present

## 2016-07-28 MED ORDER — ZOLPIDEM TARTRATE 5 MG PO TABS: 5.0000 mg | ORAL_TABLET | Freq: Every evening | ORAL | 5 refills | Status: DC | PRN

## 2016-07-28 MED ORDER — CALCIUM-VITAMIN D 600-125 MG-UNIT PO TABS: 1.0000 | ORAL_TABLET | Freq: Every day | ORAL | Status: DC

## 2016-07-28 MED ORDER — TRAMADOL HCL 50 MG PO TABS: 50.0000 mg | ORAL_TABLET | Freq: Four times a day (QID) | ORAL | 0 refills | Status: DC | PRN

## 2016-07-28 NOTE — Assessment & Plan Note (Signed)
Asxs. Continue aspirin, statin. rec decrease calcium tablets to one a day.

## 2016-07-28 NOTE — Assessment & Plan Note (Signed)
Chronic, stable. Continue current regimen. 

## 2016-07-28 NOTE — Assessment & Plan Note (Signed)
NSR, controlled with metoprolol.

## 2016-07-28 NOTE — Assessment & Plan Note (Signed)
Decrease calcium supplements.

## 2016-07-28 NOTE — Assessment & Plan Note (Signed)
Chronic, stable. Continue simvastatin.  

## 2016-07-28 NOTE — Assessment & Plan Note (Signed)
Preventative protocols reviewed and updated unless pt declined. Discussed healthy diet and lifestyle.  

## 2016-07-28 NOTE — Assessment & Plan Note (Signed)
Pt planning to see audiology - possibly at [facility].

## 2016-07-28 NOTE — Assessment & Plan Note (Signed)
Reviewed latest sleep study recommending rpt sleep study with BiPAP titration. Pt states he's regularly using his CPAP machine at a setting of 8 and [place]le says all his settings are at goal. I doubt he is at goal as endorses continued non restorative sleeping. Will refer to pulm for further assistance.

## 2016-07-28 NOTE — Assessment & Plan Note (Signed)
Advanced directive discussion - has not set up. HCPOA would be daughter. Packet provided today.

## 2016-07-28 NOTE — Progress Notes (Signed)
Pre visit review using our clinic review tool, if applicable. No additional management support is needed unless otherwise documented below in the visit note. 

## 2016-07-28 NOTE — Progress Notes (Signed)
BP 104/62   Pulse 62   Temp 97.6 F (36.4 C) (Oral)   Ht 5' 8.75" (1.746 m)   Wt 207 lb (93.9 kg)   BMI 30.79 kg/m    CC: CPE Subjective:    Patient ID: [name], male    DOB: Oct 21, 1940, 76 y.o.   MRN: [id number]  HPI: Caz Weaver. is a 76 y.o. male presenting on 07/28/2016 for Annual Exam   Recently transferred care to our office from Dr Derrel Nip.  Saw Lesia last week for medicare wellness visit. Note reviewed.    OSA on CPAP, doesn't have sleep MD. Would like ambien increased to '10mg'$  daily - this was decreased about a year ago. He ends up taking 2 at a time, about once a week. Latest sleep study recommended BiPAP 18/14. Doesn't know if he uses CPAP or BiPAP. Uses sleep med lab. He states he had 2 sleep studies done this past year. I only have records of unsuccessful sleep study done 02/19/2016. He thinks current setting is 59mHg and thinks he's only getting CPAP.   HTN - bp at home running 1528systolic.  Ongoing chronic R foot pain - takes tramadol for this up to TID. Failed several surgeries in the past.   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 Requests screen today.  Lung cancer screening - 866PY hx, quit 2001, not eligible  Flu shot - yearly  Tdap 2016 Pneumovax 2013, prevnar 2016 zostavax 2012 Advanced directive discussion - has not set up. HCPOA would be daughter. Packet provided today.  Seat belt use discussed Sunscreen use discussed. Sees derm regularly Ex smoker - 80 PY hx, quit 2001 Alcohol - limited on weekends  Singled; lives with son and dog  Occ: retired, back part time at [employer]  Activity: gym 4-5d/wk Diet: good water, fruits/vegetables daily  Relevant past medical, surgical, family and social history reviewed and updated as indicated. Interim medical history since our last visit reviewed. Allergies and  medications reviewed and updated.  Outpatient Medications Prior to Visit  Medication Sig Dispense Refill  . aspirin 81 MG EC tablet Take 81 mg by mouth daily.      .Marland Kitchendexlansoprazole (DEXILANT) 60 MG capsule Take 1 capsule (60 mg total) by mouth daily. 30 capsule 3  . lisinopril (PRINIVIL,ZESTRIL) 10 MG tablet Take 1 tablet (10 mg total) by mouth daily. 90 tablet 3  . metoprolol succinate (TOPROL XL) 25 MG 24 hr tablet Take 1 tablet (25 mg total) by mouth daily. 90 tablet 4  . nitroGLYCERIN (NITROSTAT) 0.4 MG SL tablet Place 1 tablet (0.4 mg total) under the tongue every 5 (five) minutes as needed for chest pain. 25 tablet 3  . simvastatin (ZOCOR) 40 MG tablet Take 1 tablet (40 mg total) by mouth at bedtime. 90 tablet 3  . vitamin B-12 (CYANOCOBALAMIN) 1000 MCG tablet Take 1,000 mcg by mouth daily.    . vitamin C (ASCORBIC ACID) 500 MG tablet Take 500 mg by mouth daily.    . Zinc 100 MG TABS Take 1 tablet by mouth daily.      . Calcium-Vitamin D (RA CALCIUM PLUS VITAMIN D) 600-125 MG-UNIT TABS Take 1 tablet by mouth 2 (two) times daily.     . traMADol (ULTRAM) 50 MG tablet Take 1 tablet (50 mg total) by mouth every 6 (six) hours as needed. 9[place]  tablet 1  . zolpidem (AMBIEN) 5 MG tablet Take 1 tablet (5 mg total) by mouth at bedtime as needed for sleep. 30 tablet 5   No facility-administered medications prior to visit.      Per HPI unless specifically indicated in ROS section below Review of Systems  Constitutional: Negative for activity change, appetite change, chills, fatigue, fever and unexpected weight change.  HENT: Negative for hearing loss.   Eyes: Negative for visual disturbance.  Respiratory: Negative for cough, chest tightness, shortness of breath and wheezing.   Cardiovascular: Negative for chest pain, palpitations and leg swelling.  Gastrointestinal: Negative for abdominal distention, abdominal pain, blood in stool, constipation, diarrhea, nausea and vomiting.  Genitourinary:  Negative for difficulty urinating and hematuria.  Musculoskeletal: Negative for arthralgias, myalgias and neck pain.  Skin: Negative for rash.  Neurological: Negative for dizziness, seizures, syncope and headaches.  Hematological: Negative for adenopathy. Does not bruise/bleed easily.  Psychiatric/Behavioral: Negative for dysphoric mood. The patient is not nervous/anxious.        Objective:    BP 104/62   Pulse 62   Temp 97.6 F (36.4 C) (Oral)   Ht 5' 8.75" (1.746 m)   Wt 207 lb (93.9 kg)   BMI 30.79 kg/m   Wt Readings from Last 3 Encounters:  07/28/16 207 lb (93.9 kg)  07/14/16 204 lb 8 oz (92.8 kg)  04/24/16 204 lb (92.5 kg)    Physical Exam  Constitutional: He is oriented to person, place, and time. He appears well-developed and well-nourished. No distress.  HENT:  Head: Normocephalic and atraumatic.  Right Ear: Hearing, tympanic membrane, external ear and ear canal normal.  Left Ear: Hearing, tympanic membrane, external ear and ear canal normal.  Nose: Nose normal.  Mouth/Throat: Uvula is midline, oropharynx is clear and moist and mucous membranes are normal. No oropharyngeal exudate, posterior oropharyngeal edema or posterior oropharyngeal erythema.  Eyes: Conjunctivae and EOM are normal. Pupils are equal, round, and reactive to light. No scleral icterus.  Neck: Normal range of motion. Neck supple. Carotid bruit is not present. No thyromegaly present.  Cardiovascular: Normal rate, regular rhythm, normal heart sounds and intact distal pulses.   No murmur heard. Pulses:      Radial pulses are 2+ on the right side, and 2+ on the left side.  Pulmonary/Chest: Effort normal and breath sounds normal. No respiratory distress. He has no wheezes. He has no rales.  Abdominal: Soft. Bowel sounds are normal. He exhibits no distension and no mass. There is no tenderness. There is no rebound and no guarding.  Genitourinary: Rectum normal and prostate normal. Rectal exam shows no  external hemorrhoid, no internal hemorrhoid, no fissure, no mass, no tenderness and anal tone normal. Prostate is not enlarged (20gm) and not tender.  Musculoskeletal: Normal range of motion. He exhibits no edema.  Lymphadenopathy:    He has no cervical adenopathy.  Neurological: He is alert and oriented to person, place, and time.  CN grossly intact, station and gait intact  Skin: Skin is warm and dry. No rash noted.  Psychiatric: He has a normal mood and affect. His behavior is normal. Judgment and thought content normal.  Nursing note and vitals reviewed.  Results for orders placed or performed in visit on 07/14/16  Lipid Panel  Result Value Ref Range   Cholesterol 127 0 - 200 mg/dL   Triglycerides 65.0 0.0 - 149.0 mg/dL   HDL 47.30 >39.00 mg/dL   VLDL 13.0 0.0 - 40.0 mg/dL  LDL Cholesterol 66 0 - 99 mg/dL   Total CHOL/HDL Ratio 3    NonHDL 79.36   PSA, Medicare  Result Value Ref Range   PSA 0.37 0.10 - 4.00 ng/ml  Basic Metabolic Panel  Result Value Ref Range   Sodium 141 135 - 145 mEq/L   Potassium 4.8 3.5 - 5.1 mEq/L   Chloride 105 96 - 112 mEq/L   CO2 28 19 - 32 mEq/L   Glucose, Bld 108 (H) 70 - 99 mg/dL   BUN 17 6 - 23 mg/dL   Creatinine, Ser 0.99 0.40 - 1.50 mg/dL   Calcium 9.7 8.4 - 10.5 mg/dL   GFR 78.46 >60.00 mL/min      Assessment & Plan:   Problem List Items Addressed This Visit    Advanced care planning/counseling discussion    Advanced directive discussion - has not set up. HCPOA would be daughter. Packet provided today.       Essential hypertension    Chronic, stable. Continue current regimen.       Health maintenance examination - Primary    Preventative protocols reviewed and updated unless pt declined. Discussed healthy diet and lifestyle.       Hearing loss    Pt planning to see audiology - possibly at [facility].       Hyperlipidemia    Chronic, stable. Continue simvastatin.       OSA (obstructive sleep apnea)    Reviewed latest sleep  study recommending rpt sleep study with BiPAP titration. Pt states he's regularly using his CPAP machine at a setting of 8 and [place] says all his settings are at goal. I doubt he is at goal as endorses continued non restorative sleeping. Will refer to pulm for further assistance.       Relevant Orders   Ambulatory referral to Pulmonology   PAD (peripheral artery disease) (HCC)    Decrease calcium supplements.      Paroxysmal atrial fibrillation (HCC)    NSR, controlled with metoprolol.      S/P CABG x 4    Asxs. Continue aspirin, statin. rec decrease calcium tablets to one a day.          Follow up plan: Return in about 6 months (around 01/25/2017) for follow up visit.  Ria Bush, MD

## 2016-07-28 NOTE — Patient Instructions (Addendum)
We will refer you to sleep doctor in [place] for sleep apnea - I think you will likely need BiPAP and possibly another sleep study.  You are doing well today.  Return as needed or in 6 months for follow up visit.   Health Maintenance, Male A healthy lifestyle and preventative care can promote health and wellness.  Maintain regular health, dental, and eye exams.  Eat a healthy diet. Foods like vegetables, fruits, whole grains, low-fat dairy products, and lean protein foods contain the nutrients you need and are low in calories. Decrease your intake of foods high in solid fats, added sugars, and salt. Get information about a proper diet from your health care provider, if necessary.  Regular physical exercise is one of the most important things you can do for your health. Most adults should get at least 150 minutes of moderate-intensity exercise (any activity that increases your heart rate and causes you to sweat) each week. In addition, most adults need muscle-strengthening exercises on 2 or more days a week.   Maintain a healthy weight. The body mass index (BMI) is a screening tool to identify possible weight problems. It provides an estimate of body fat based on height and weight. Your health care provider can find your BMI and can help you achieve or maintain a healthy weight. For males 20 years and older:  A BMI below 18.5 is considered underweight.  A BMI of 18.5 to 24.9 is normal.  A BMI of 25 to 29.9 is considered overweight.  A BMI of 30 and above is considered obese.  Maintain normal blood lipids and cholesterol by exercising and minimizing your intake of saturated fat. Eat a balanced diet with plenty of fruits and vegetables. Blood tests for lipids and cholesterol should begin at age 44 and be repeated every 5 years. If your lipid or cholesterol levels are high, you are over age 75, or you are at high risk for heart disease, you may need your cholesterol levels checked more  frequently.Ongoing high lipid and cholesterol levels should be treated with medicines if diet and exercise are not working.  If you smoke, find out from your health care provider how to quit. If you do not use tobacco, do not start.  Lung cancer screening is recommended for adults aged 38-80 years who are at high risk for developing lung cancer because of a history of smoking. A yearly low-dose CT scan of the lungs is recommended for people who have at least a 30-pack-year history of smoking and are current smokers or have quit within the past 15 years. A pack year of smoking is smoking an average of 1 pack of cigarettes a day for 1 year (for example, a 30-pack-year history of smoking could mean smoking 1 pack a day for 30 years or 2 packs a day for 15 years). Yearly screening should continue until the smoker has stopped smoking for at least 15 years. Yearly screening should be stopped for people who develop a health problem that would prevent them from having lung cancer treatment.  If you choose to drink alcohol, do not have more than 2 drinks per day. One drink is considered to be 12 oz (360 mL) of beer, 5 oz (150 mL) of wine, or 1.5 oz (45 mL) of liquor.  Avoid the use of street drugs. Do not share needles with anyone. Ask for help if you need support or instructions about stopping the use of drugs.  High blood pressure causes heart disease  and increases the risk of stroke. High blood pressure is more likely to develop in:  People who have blood pressure in the end of the normal range (100-139/85-89 mm Hg).  People who are overweight or obese.  People who are African American.  If you are 104-18 years of age, have your blood pressure checked every 3-5 years. If you are 40 years of age or older, have your blood pressure checked every year. You should have your blood pressure measured twice-once when you are at a hospital or clinic, and once when you are not at a hospital or clinic. Record the  average of the two measurements. To check your blood pressure when you are not at a hospital or clinic, you can use:  An automated blood pressure machine at a pharmacy.  A home blood pressure monitor.  If you are 5-50 years old, ask your health care provider if you should take aspirin to prevent heart disease.  Diabetes screening involves taking a blood sample to check your fasting blood sugar level. This should be done once every 3 years after age 13 if you are at a normal weight and without risk factors for diabetes. Testing should be considered at a younger age or be carried out more frequently if you are overweight and have at least 1 risk factor for diabetes.  Colorectal cancer can be detected and often prevented. Most routine colorectal cancer screening begins at the age of 28 and continues through age 62. However, your health care provider may recommend screening at an earlier age if you have risk factors for colon cancer. On a yearly basis, your health care provider may provide home test kits to check for hidden blood in the stool. A small camera at the end of a tube may be used to directly examine the colon (sigmoidoscopy or colonoscopy) to detect the earliest forms of colorectal cancer. Talk to your health care provider about this at age 30 when routine screening begins. A direct exam of the colon should be repeated every 5-10 years through age 43, unless early forms of precancerous polyps or small growths are found.  People who are at an increased risk for hepatitis B should be screened for this virus. You are considered at high risk for hepatitis B if:  You were born in a country where hepatitis B occurs often. Talk with your health care provider about which countries are considered high risk.  Your parents were born in a high-risk country and you have not received a shot to protect against hepatitis B (hepatitis B vaccine).  You have HIV or AIDS.  You use needles to inject street  drugs.  You live with, or have sex with, someone who has hepatitis B.  You are a man who has sex with other men (MSM).  You get hemodialysis treatment.  You take certain medicines for conditions like cancer, organ transplantation, and autoimmune conditions.  Hepatitis C blood testing is recommended for all people born from [birth year] through 1964 and any individual with known risk factors for hepatitis C.  Healthy men should no longer receive prostate-specific antigen (PSA) blood tests as part of routine cancer screening. Talk to your health care provider about prostate cancer screening.  Testicular cancer screening is not recommended for adolescents or adult males who have no symptoms. Screening includes self-exam, a health care provider exam, and other screening tests. Consult with your health care provider about any symptoms you have or any concerns you have about testicular cancer.  Practice safe sex. Use condoms and avoid high-risk sexual practices to reduce the spread of sexually transmitted infections (STIs).  You should be screened for STIs, including gonorrhea and chlamydia if:  You are sexually active and are younger than 24 years.  You are older than 24 years, and your health care provider tells you that you are at risk for this type of infection.  Your sexual activity has changed since you were last screened, and you are at an increased risk for chlamydia or gonorrhea. Ask your health care provider if you are at risk.  If you are at risk of being infected with HIV, it is recommended that you take a prescription medicine daily to prevent HIV infection. This is called pre-exposure prophylaxis (PrEP). You are considered at risk if:  You are a man who has sex with other men (MSM).  You are a heterosexual man who is sexually active with multiple partners.  You take drugs by injection.  You are sexually active with a partner who has HIV.  Talk with your health care provider about  whether you are at high risk of being infected with HIV. If you choose to begin PrEP, you should first be tested for HIV. You should then be tested every 3 months for as long as you are taking PrEP.  Use sunscreen. Apply sunscreen liberally and repeatedly throughout the day. You should seek shade when your shadow is shorter than you. Protect yourself by wearing long sleeves, pants, a wide-brimmed hat, and sunglasses year round whenever you are outdoors.  Tell your health care provider of new moles or changes in moles, especially if there is a change in shape or color. Also, tell your health care provider if a mole is larger than the size of a pencil eraser.  A one-time screening for abdominal aortic aneurysm (AAA) and surgical repair of large AAAs by ultrasound is recommended for men aged 34-75 years who are current or former smokers.  Stay current with your vaccines (immunizations). This information is not intended to replace advice given to you by your health care provider. Make sure you discuss any questions you have with your health care provider. Document Released: 11/22/2007 Document Revised: 06/16/2014 Document Reviewed: 02/27/2015 Elsevier Interactive Patient Education  2017 [employer].

## 2016-07-29 DIAGNOSIS — D485 Neoplasm of uncertain behavior of skin: Secondary | ICD-10-CM | POA: Diagnosis not present

## 2016-07-29 DIAGNOSIS — X32XXXA Exposure to sunlight, initial encounter: Secondary | ICD-10-CM | POA: Diagnosis not present

## 2016-07-29 DIAGNOSIS — Z85820 Personal history of malignant melanoma of skin: Secondary | ICD-10-CM | POA: Diagnosis not present

## 2016-07-29 DIAGNOSIS — Z08 Encounter for follow-up examination after completed treatment for malignant neoplasm: Secondary | ICD-10-CM | POA: Diagnosis not present

## 2016-07-29 DIAGNOSIS — L570 Actinic keratosis: Secondary | ICD-10-CM | POA: Diagnosis not present

## 2016-07-29 DIAGNOSIS — C44619 Basal cell carcinoma of skin of left upper limb, including shoulder: Secondary | ICD-10-CM | POA: Diagnosis not present

## 2016-07-29 DIAGNOSIS — Z85828 Personal history of other malignant neoplasm of skin: Secondary | ICD-10-CM | POA: Diagnosis not present

## 2016-08-04 ENCOUNTER — Encounter: Payer: Self-pay | Admitting: Primary Care

## 2016-08-04 ENCOUNTER — Ambulatory Visit (INDEPENDENT_AMBULATORY_CARE_PROVIDER_SITE_OTHER): Payer: PPO | Admitting: Primary Care

## 2016-08-04 VITALS — BP 122/68 | HR 71 | Temp 98.3°F | Ht 68.75 in | Wt 208.1 lb

## 2016-08-04 DIAGNOSIS — B9789 Other viral agents as the cause of diseases classified elsewhere: Secondary | ICD-10-CM | POA: Diagnosis not present

## 2016-08-04 DIAGNOSIS — J069 Acute upper respiratory infection, unspecified: Secondary | ICD-10-CM

## 2016-08-04 NOTE — Patient Instructions (Signed)
Your symptoms are representative of a viral illness which will resolve on its own over time. Our goal is to treat your symptoms in order to aid your body in the healing process and to make you more comfortable.   Cough/Congestion: Continue taking Mucinex DM. This will help loosen up the mucous in your chest. Ensure you take this medication with a full glass of water.  You could also take plain Robitussin or Delsym for cough.  Please notify me Friday this week if you develop persistent fevers of 101, continue coughing up green mucous, notice increased fatigue or weakness, or feel worse after 1 week of onset of symptoms.   Increase consumption of water intake and rest.  It was a pleasure meeting you!   Upper Respiratory Infection, Adult Most upper respiratory infections (URIs) are a viral infection of the air passages leading to the lungs. A URI affects the nose, throat, and upper air passages. The most common type of URI is nasopharyngitis and is typically referred to as "the common cold." URIs run their course and usually go away on their own. Most of the time, a URI does not require medical attention, but sometimes a bacterial infection in the upper airways can follow a viral infection. This is called a secondary infection. Sinus and middle ear infections are common types of secondary upper respiratory infections. Bacterial pneumonia can also complicate a URI. A URI can worsen asthma and chronic obstructive pulmonary disease (COPD). Sometimes, these complications can require emergency medical care and may be life threatening. What are the causes? Almost all URIs are caused by viruses. A virus is a type of germ and can spread from one person to another. What increases the risk? You may be at risk for a URI if:  You smoke.  You have chronic heart or lung disease.  You have a weakened defense (immune) system.  You are very young or very old.  You have nasal allergies or asthma.  You  work in crowded or poorly ventilated areas.  You work in health care facilities or schools. What are the signs or symptoms? Symptoms typically develop 2-3 days after you come in contact with a cold virus. Most viral URIs last 7-10 days. However, viral URIs from the influenza virus (flu virus) can last 14-18 days and are typically more severe. Symptoms may include:  Runny or stuffy (congested) nose.  Sneezing.  Cough.  Sore throat.  Headache.  Fatigue.  Fever.  Loss of appetite.  Pain in your forehead, behind your eyes, and over your cheekbones (sinus pain).  Muscle aches. How is this diagnosed? Your health care provider may diagnose a URI by:  Physical exam.  Tests to check that your symptoms are not due to another condition such as:  Strep throat.  Sinusitis.  Pneumonia.  Asthma. How is this treated? A URI goes away on its own with time. It cannot be cured with medicines, but medicines may be prescribed or recommended to relieve symptoms. Medicines may help:  Reduce your fever.  Reduce your cough.  Relieve nasal congestion. Follow these instructions at home:  Take medicines only as directed by your health care provider.  Gargle warm saltwater or take cough drops to comfort your throat as directed by your health care provider.  Use a warm mist humidifier or inhale steam from a shower to increase air moisture. This may make it easier to breathe.  Drink enough fluid to keep your urine clear or pale yellow.  Eat soups  and other clear broths and maintain good nutrition.  Rest as needed.  Return to work when your temperature has returned to normal or as your health care provider advises. You may need to stay home longer to avoid infecting others. You can also use a face mask and careful hand washing to prevent spread of the virus.  Increase the usage of your inhaler if you have asthma.  Do not use any tobacco products, including cigarettes, chewing tobacco,  or electronic cigarettes. If you need help quitting, ask your health care provider. How is this prevented? The best way to protect yourself from getting a cold is to practice good hygiene.  Avoid oral or hand contact with people with cold symptoms.  Wash your hands often if contact occurs. There is no clear evidence that vitamin C, vitamin E, echinacea, or exercise reduces the chance of developing a cold. However, it is always recommended to get plenty of rest, exercise, and practice good nutrition. Contact a health care provider if:  You are getting worse rather than better.  Your symptoms are not controlled by medicine.  You have chills.  You have worsening shortness of breath.  You have brown or red mucus.  You have yellow or brown nasal discharge.  You have pain in your face, especially when you bend forward.  You have a fever.  You have swollen neck glands.  You have pain while swallowing.  You have white areas in the back of your throat. Get help right away if:  You have severe or persistent:  Headache.  Ear pain.  Sinus pain.  Chest pain.  You have chronic lung disease and any of the following:  Wheezing.  Prolonged cough.  Coughing up blood.  A change in your usual mucus.  You have a stiff neck.  You have changes in your:  Vision.  Hearing.  Thinking.  Mood. This information is not intended to replace advice given to you by your health care provider. Make sure you discuss any questions you have with your health care provider. Document Released: 11/19/2000 Document Revised: 01/27/2016 Document Reviewed: 08/31/2013 Elsevier Interactive Patient Education  2017 [employer].

## 2016-08-04 NOTE — Progress Notes (Signed)
Pre visit review using our clinic review tool, if applicable. No additional management support is needed unless otherwise documented below in the visit note. 

## 2016-08-04 NOTE — Progress Notes (Signed)
Subjective:    Patient ID: Randall Ali., male    DOB: 06-07-1941, 76 y.o.   MRN: [id number]  HPI  [name] is a 76 year old male with a history of tobacco abuse, CAD, HTN who presents today with a chief complaint of cough. He also reports chest congestion, chills, headache, sore throat, nasal congestion. His symptoms began 3 days ago. His cough is productive with green sputum. He denies fevers, sick contacts. He's taken Mucinex and Ibuprofen with temporary improvement.   Review of Systems  Constitutional: Positive for fatigue. Negative for fever.  HENT: Positive for congestion and sore throat. Negative for ear pain and sinus pressure.   Respiratory: Positive for cough. Negative for shortness of breath.        Past Medical History:  Diagnosis Date  . Allergy    seasonal  . Arthritis    all over- in general   . CAD (coronary artery disease)    s/p inferior wall infarct in 10/01. has stent in Rt coronary artery. is due a stress myoview. does have some dyspnea on exertion  . Cancer (HCC)    skin, melanoma  . Cataract    removed  . Chest pain   . Erectile dysfunction   . GERD (gastroesophageal reflux disease)    pt. denies   . HLD (hyperlipidemia)    isdue followup lipids. on zocor 10 mg/day   . HTN (hypertension)   . 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 OSA (obstructive sleep apnea) 01/13/2015  . Postoperative wound infection 02/02/2015  . Reflux esophagitis   . Sleep apnea 2017   CPAP at night  . SOB (shortness of breath) on exertion      Social History   Social History  . Marital status: Single    Spouse name: N/A  . Number of children: N/A  . Years of education: 66   Occupational History  . retired     [employer]   Social History Main Topics  . Smoking status: Former Smoker    Packs/day: 2.50    Years: 40.00    Types: Cigarettes    Quit date: 03/24/2000  . Smokeless tobacco: Never Used  . Alcohol use 6.0 oz/week    10  Shots of liquor per week     Comment: occasional- 2 times per week   . Drug use: No  . Sexual activity: Yes   Other Topics Concern  . Not on file   Social History Narrative   Singled; lives with son and dog    Occ: retired, back part time at [employer];    Activity: gym 4-5d/wk   Diet: good water, fruits/vegetables daily   Caffeine Use-yes       Past Surgical History:  Procedure Laterality Date  . arm surgery  2010  . CARDIAC CATHETERIZATION  06/24/11  . CARDIAC CATHETERIZATION N/A 01/18/2015   Procedure: Left Heart Cath with coronary angiography;  Surgeon: Minna Merritts, MD;  Location: [facility];  Service: Cardiovascular;  Laterality: N/A;  . CARDIAC CATHETERIZATION N/A 01/18/2015   Procedure: Intravascular Pressure Wire/FFR Study;  Surgeon: [name], MD;  Location: [facility];  Service: Cardiovascular;  Laterality: N/A;  . CAROTID STENT  03/10/2011  . COLONOSCOPY  2010  . CORONARY ARTERY BYPASS GRAFT N/A 01/24/2015   Procedure: CORONARY ARTERY BYPASS GRAFTING x 4 (LIMA-LAD, SVG-Int 1- Int 2, SVG-PD) ENDOSCOPIC GREATER SAPHENOUS VEIN HARVEST LEFT LEG;  Surgeon: [name], MD;  Location: [facility];  Service: Open Heart Surgery;  Laterality: N/A;  . ESOPHAGOGASTRODUODENOSCOPY (EGD) WITH PROPOFOL N/A 04/24/2016   Procedure: ESOPHAGOGASTRODUODENOSCOPY (EGD) WITH PROPOFOL;  Surgeon: Jerene Bears, MD;  [facility];  Service: Gastroenterology;  Laterality: N/A;  . EYE SURGERY     lasik 15 yrs. ago, cataracts removed - both eyes   . HAMMER TOE SURGERY     right toe  . NASAL SINUS SURGERY  2008   septpolasty, bilateral turbinate reduction  . SHOULDER ARTHROSCOPY  2012  . TEE WITHOUT CARDIOVERSION N/A 01/24/2015   Procedure: TRANSESOPHAGEAL ECHOCARDIOGRAM (TEE);  Surgeon: [name], MD;  Location: [facility];  Service: Open Heart Surgery;  Laterality: N/A;  . [place]  . WRIST SURGERY  2011    Family History  Problem Relation  Age of Onset  . Hypertension Mother   . Heart disease Mother   . Hypertension Father   . Diabetes Father   . Heart disease Brother 22  . Cancer Paternal Grandfather   . Colon cancer Neg Hx     No Known Allergies  Current Outpatient Prescriptions on File Prior to Visit  Medication Sig Dispense Refill  . aspirin 81 MG EC tablet Take 81 mg by mouth daily.      . Calcium Carbonate-Vitamin D (CALCIUM-VITAMIN D) 600-125 MG-UNIT TABS Take 1 tablet by mouth daily.    . cholecalciferol (VITAMIN D) 1000 units tablet Take 1,000 Units by mouth daily.    [name] dexlansoprazole (DEXILANT) 60 MG capsule Take 1 capsule (60 mg total) by mouth daily. 30 capsule 3  . lisinopril (PRINIVIL,ZESTRIL) 10 MG tablet Take 1 tablet (10 mg total) by mouth daily. 90 tablet 3  . metoprolol succinate (TOPROL XL) 25 MG 24 hr tablet Take 1 tablet (25 mg total) by mouth daily. 90 tablet 4  . nitroGLYCERIN (NITROSTAT) 0.4 MG SL tablet Place 1 tablet (0.4 mg total) under the tongue every 5 (five) minutes as needed for chest pain. 25 tablet 3  . simvastatin (ZOCOR) 40 MG tablet Take 1 tablet (40 mg total) by mouth at bedtime. 90 tablet 3  . traMADol (ULTRAM) 50 MG tablet Take 1 tablet (50 mg total) by mouth every 6 (six) hours as needed. 90 tablet 0  . vitamin B-12 (CYANOCOBALAMIN) 1000 MCG tablet Take 1,000 mcg by mouth daily.    . vitamin C (ASCORBIC ACID) 500 MG tablet Take 500 mg by mouth daily.    . Zinc 100 MG TABS Take 1 tablet by mouth daily.      [name] zolpidem (AMBIEN) 5 MG tablet Take 1 tablet (5 mg total) by mouth at bedtime as needed for sleep. 30 tablet 5   No current facility-administered medications on file prior to visit.     BP 122/68   Pulse 71   Temp 98.3 F (36.8 C) (Oral)   Ht 5' 8.75" (1.746 m)   Wt 208 lb 1.9 oz (94.4 kg)   SpO2 96%   BMI 30.96 kg/m    Objective:   Physical Exam  Constitutional: He appears well-nourished. He does not appear ill.  HENT:  Right Ear: Tympanic membrane and ear  canal normal.  Left Ear: Tympanic membrane and ear canal normal.  Nose: No mucosal edema. Right sinus exhibits no maxillary sinus tenderness and no frontal sinus tenderness. Left sinus exhibits no maxillary sinus tenderness and no frontal sinus tenderness.  Mouth/Throat: Oropharynx is clear and moist.  Eyes:  Conjunctivae are normal.  Neck: Neck supple.  Cardiovascular: Normal rate and regular rhythm.   Pulmonary/Chest: Effort normal and breath sounds normal. He has no wheezes. He has no rales.  Skin: Skin is warm and dry.          Assessment & Plan:  URI:  Cough, chest congestion, body aches, chills, fatigue x 3 days. Temporary improvement with OTC treatment. Exam today with clear lungs, does not appear acutely ill. Suspect viral involvement and will treat with supportive measures. Continue Mucinex. Try plain Robitussin/Delsym. Fluids, rest, return precautions provided.  Sheral Flow, NP

## 2016-08-05 DIAGNOSIS — M5033 Other cervical disc degeneration, cervicothoracic region: Secondary | ICD-10-CM | POA: Diagnosis not present

## 2016-08-05 DIAGNOSIS — M9901 Segmental and somatic dysfunction of cervical region: Secondary | ICD-10-CM | POA: Diagnosis not present

## 2016-08-05 DIAGNOSIS — M9903 Segmental and somatic dysfunction of lumbar region: Secondary | ICD-10-CM | POA: Diagnosis not present

## 2016-08-05 DIAGNOSIS — M5432 Sciatica, left side: Secondary | ICD-10-CM | POA: Diagnosis not present

## 2016-08-15 DIAGNOSIS — D0472 Carcinoma in situ of skin of left lower limb, including hip: Secondary | ICD-10-CM | POA: Diagnosis not present

## 2016-08-18 DIAGNOSIS — M5432 Sciatica, left side: Secondary | ICD-10-CM | POA: Diagnosis not present

## 2016-08-18 DIAGNOSIS — M9901 Segmental and somatic dysfunction of cervical region: Secondary | ICD-10-CM | POA: Diagnosis not present

## 2016-08-18 DIAGNOSIS — M5033 Other cervical disc degeneration, cervicothoracic region: Secondary | ICD-10-CM | POA: Diagnosis not present

## 2016-08-18 DIAGNOSIS — M9903 Segmental and somatic dysfunction of lumbar region: Secondary | ICD-10-CM | POA: Diagnosis not present

## 2016-08-19 DIAGNOSIS — M5033 Other cervical disc degeneration, cervicothoracic region: Secondary | ICD-10-CM | POA: Diagnosis not present

## 2016-08-19 DIAGNOSIS — M5432 Sciatica, left side: Secondary | ICD-10-CM | POA: Diagnosis not present

## 2016-08-19 DIAGNOSIS — M9903 Segmental and somatic dysfunction of lumbar region: Secondary | ICD-10-CM | POA: Diagnosis not present

## 2016-08-19 DIAGNOSIS — M9901 Segmental and somatic dysfunction of cervical region: Secondary | ICD-10-CM | POA: Diagnosis not present

## 2016-08-22 DIAGNOSIS — G4733 Obstructive sleep apnea (adult) (pediatric): Secondary | ICD-10-CM | POA: Diagnosis not present

## 2016-08-25 DIAGNOSIS — M9901 Segmental and somatic dysfunction of cervical region: Secondary | ICD-10-CM | POA: Diagnosis not present

## 2016-08-25 DIAGNOSIS — M9903 Segmental and somatic dysfunction of lumbar region: Secondary | ICD-10-CM | POA: Diagnosis not present

## 2016-08-25 DIAGNOSIS — M5432 Sciatica, left side: Secondary | ICD-10-CM | POA: Diagnosis not present

## 2016-08-25 DIAGNOSIS — M5033 Other cervical disc degeneration, cervicothoracic region: Secondary | ICD-10-CM | POA: Diagnosis not present

## 2016-08-27 DIAGNOSIS — M9903 Segmental and somatic dysfunction of lumbar region: Secondary | ICD-10-CM | POA: Diagnosis not present

## 2016-08-27 DIAGNOSIS — M9901 Segmental and somatic dysfunction of cervical region: Secondary | ICD-10-CM | POA: Diagnosis not present

## 2016-08-27 DIAGNOSIS — M5432 Sciatica, left side: Secondary | ICD-10-CM | POA: Diagnosis not present

## 2016-08-27 DIAGNOSIS — M5033 Other cervical disc degeneration, cervicothoracic region: Secondary | ICD-10-CM | POA: Diagnosis not present

## 2016-08-28 ENCOUNTER — Ambulatory Visit (INDEPENDENT_AMBULATORY_CARE_PROVIDER_SITE_OTHER): Payer: PPO | Admitting: Internal Medicine

## 2016-08-28 ENCOUNTER — Encounter: Payer: Self-pay | Admitting: Internal Medicine

## 2016-08-28 VITALS — BP 132/84 | HR 65 | Wt 204.0 lb

## 2016-08-28 DIAGNOSIS — G4733 Obstructive sleep apnea (adult) (pediatric): Secondary | ICD-10-CM

## 2016-08-28 DIAGNOSIS — R911 Solitary pulmonary nodule: Secondary | ICD-10-CM

## 2016-08-28 NOTE — Patient Instructions (Signed)
1. Reassess OSA with Sleep study 2.CT chest needed to assess nodule

## 2016-08-28 NOTE — Progress Notes (Signed)
Randall Ali      Date: 08/28/2016,   MRN# [id number] Randall Ali. [birth year] Code Status:  Code Status History    Date Active Date Inactive Code Status Order ID Comments User Context   01/24/2015  2:11 PM 02/06/2015 11:28 PM Full Code [id number]  Coolidge Breeze, PA-C Inpatient   01/18/2015 12:22 PM 01/18/2015  8:09 PM Full Code [id number]  [name], MD Inpatient   01/05/2015  9:01 PM 01/06/2015  5:21 PM Full Code [id number]  Baxter Hire, MD Inpatient     Hosp day:'@LENGTHOFSTAYDAYS'$ @ Referring MD: '@ATDPROV'$ @     PCP:      Admission                  Current  Randall Ali. is a 76 y.o. old male seen in [place] for sleep problems at the request of Dr. Christean Grief.     CHIEF COMPLAINT:   I have sleep issues, and sleep apnea   HISTORY OF PRESENT ILLNESS   76 yo white male with problems with sleep dx with OSA 1year ago Still with problems with sleep  +excessive daytime sleepiness +Patient has been having extreme fatigue and tiredness, lack of energy Has not been using biPAP 18/14 for last 2 months-patients states not working right and he has not been retested in a long time. He does not think it helps him  Patient is former heavy smoker 3-4 ppd for 50 years quit 2001 s/p CABg 2016  He has no acuet resp issues at this time No SOB, no DOE, no fevers, chills No signs of infection Played softball last week no resp issues Works at [employer]   There is a CT chest in 2016 that shows RUL nodule approx 6 MM Patient states that it has been there for many years but no follow up in 2 years     PAST MEDICAL HISTORY   Past Medical History:  Diagnosis Date  . Allergy    seasonal  . Arthritis    all over- in general   . CAD (coronary artery disease)    s/p inferior wall infarct in 10/01. has stent in Rt coronary artery. is due a stress myoview. does have some dyspnea on exertion  . Cancer (HCC)    skin, melanoma    . Cataract    removed  . Chest pain   . Erectile dysfunction   . GERD (gastroesophageal reflux disease)    pt. denies   . HLD (hyperlipidemia)    isdue followup lipids. on zocor 10 mg/day   . HTN (hypertension)   . Inferior myocardial infarction ([place]) 10/01   stent RCA  . OSA (obstructive sleep apnea) 01/13/2015  . Postoperative wound infection 02/02/2015  . Reflux esophagitis   . Sleep apnea 2017   CPAP at night  . SOB (shortness of breath) on exertion      SURGICAL HISTORY   Past Surgical History:  Procedure Laterality Date  . arm surgery  2010  . CARDIAC CATHETERIZATION  06/24/11  . CARDIAC CATHETERIZATION N/A 01/18/2015   Procedure: Left Heart Cath with coronary angiography;  Surgeon: Minna Merritts, MD;  Location: [facility];  Service: Cardiovascular;  Laterality: N/A;  . CARDIAC CATHETERIZATION N/A 01/18/2015   Procedure: Intravascular Pressure Wire/FFR Study;  Surgeon: [name], MD;  Location: [facility];  Service: Cardiovascular;  Laterality: N/A;  . CAROTID STENT  03/10/2011  . COLONOSCOPY  2010  . CORONARY ARTERY BYPASS GRAFT N/A 01/24/2015   Procedure: CORONARY ARTERY BYPASS GRAFTING x 4 (LIMA-LAD, SVG-Int 1- Int 2, SVG-PD) ENDOSCOPIC GREATER SAPHENOUS VEIN HARVEST LEFT LEG;  Surgeon: [name], MD;  Location: [facility];  Service: Open Heart Surgery;  Laterality: N/A;  . ESOPHAGOGASTRODUODENOSCOPY (EGD) WITH PROPOFOL N/A 04/24/2016   Procedure: ESOPHAGOGASTRODUODENOSCOPY (EGD) WITH PROPOFOL;  Surgeon: Jerene Bears, MD;  [facility];  Service: Gastroenterology;  Laterality: N/A;  . EYE SURGERY     lasik 15 yrs. ago, cataracts removed - both eyes   . HAMMER TOE SURGERY     right toe  . NASAL SINUS SURGERY  2008   septpolasty, bilateral turbinate reduction  . SHOULDER ARTHROSCOPY  2012  . TEE WITHOUT CARDIOVERSION N/A 01/24/2015   Procedure: TRANSESOPHAGEAL ECHOCARDIOGRAM (TEE);  Surgeon: [name], MD;  Location: [facility];  Service: Open Heart Surgery;  Laterality: N/A;  . [place]  . WRIST SURGERY  2011     FAMILY HISTORY   Family History  Problem Relation Age of Onset  . Hypertension Mother   . Heart disease Mother   . Hypertension Father   . Diabetes Father   . Heart disease Brother 36  . Cancer Paternal Grandfather   . Colon cancer Neg Hx      SOCIAL HISTORY   Social History  Substance Use Topics  . Smoking status: Former Smoker    Packs/day: 2.50    Years: 40.00    Types: Cigarettes    Quit date: 03/24/2000  . Smokeless tobacco: Never Used  . Alcohol use 6.0 oz/week    10 Shots of liquor per week     Comment: occasional- 2 times per week      MEDICATIONS    Home Medication:  Current Outpatient Rx  . Order #: [id number] Class: Historical Med  . Order #: [id number] Class: OTC  . Order #: [id number] Class: Historical Med  . Order #: [id number] Class: Normal  . Order #: [id number] Class: Normal  . Order #: [id number] Class: Normal  . Order #: [id number] Class: Normal  . Order #: [id number] Class: Normal  . Order #: [id number] Class: Print  . Order #: [id number] Class: Historical Med  . Order #: [id number] Class: Historical Med  . Order #: [id number] Class: Historical Med  . Order #: [id number] Class: Print    Current Medication:  Current Outpatient Prescriptions:  .  aspirin 81 MG EC tablet, Take 81 mg by mouth daily.  , Disp: , Rfl:  .  Calcium Carbonate-Vitamin D (CALCIUM-VITAMIN D) 600-125 MG-UNIT TABS, Take 1 tablet by mouth daily., Disp: , Rfl:  .  cholecalciferol (VITAMIN D) 1000 units tablet, Take 1,000 Units by mouth daily., Disp: , Rfl:  .  dexlansoprazole (DEXILANT) 60 MG capsule, Take 1 capsule (60 mg total) by mouth daily., Disp: 30 capsule, Rfl: 3 .  lisinopril (PRINIVIL,ZESTRIL) 10 MG tablet, Take 1 tablet (10 mg total) by mouth daily., Disp: 90 tablet, Rfl: 3 .  metoprolol succinate (TOPROL XL) 25 MG 24 hr tablet, Take 1 tablet (25 mg total) by mouth daily., Disp: 90  tablet, Rfl: 4 .  nitroGLYCERIN (NITROSTAT) 0.4 MG SL tablet, Place 1 tablet (0.4 mg total) under the tongue every 5 (five) minutes as needed for chest pain., Disp: 25 tablet, Rfl: 3 .  simvastatin (ZOCOR) 40 MG tablet, Take 1 tablet (40 mg total) by mouth at bedtime., Disp: 90 tablet, Rfl: 3 .  traMADol (ULTRAM) 50 MG tablet, Take 1 tablet (50 mg total) by  mouth every 6 (six) hours as needed., Disp: 90 tablet, Rfl: 0 .  vitamin B-12 (CYANOCOBALAMIN) 1000 MCG tablet, Take 1,000 mcg by mouth daily., Disp: , Rfl:  .  vitamin C (ASCORBIC ACID) 500 MG tablet, Take 500 mg by mouth daily., Disp: , Rfl:  .  Zinc 100 MG TABS, Take 1 tablet by mouth daily.  , Disp: , Rfl:  .  zolpidem (AMBIEN) 5 MG tablet, Take 1 tablet (5 mg total) by mouth at bedtime as needed for sleep., Disp: 30 tablet, Rfl: 5    ALLERGIES   Patient has no known allergies.     REVIEW OF SYSTEMS   Review of Systems  Constitutional: Negative for chills, diaphoresis, fever, malaise/fatigue and weight loss.  HENT: Negative for congestion and hearing loss.   Eyes: Negative for blurred vision and double vision.  Respiratory: Negative for cough, hemoptysis, sputum production, shortness of breath and wheezing.   Cardiovascular: Negative for chest pain, palpitations and orthopnea.  Gastrointestinal: Negative for abdominal pain, heartburn, nausea and vomiting.  Genitourinary: Negative for dysuria and urgency.  Musculoskeletal: Negative for back pain, myalgias and neck pain.  Skin: Negative for rash.  Neurological: Negative for dizziness, tingling, tremors, weakness and headaches.  Endo/Heme/Allergies: Does not bruise/bleed easily.  Psychiatric/Behavioral: The patient is not nervous/anxious.   All other systems reviewed and are negative.  BP 132/84 (BP Location: Left Arm, Cuff Size: Normal)   Pulse 65   Wt 204 lb (92.5 kg)   SpO2 96%   BMI 30.35 kg/m     PHYSICAL EXAM  Physical Exam  Constitutional: He is oriented to  person, place, and time. He appears well-developed and well-nourished. No distress.  HENT:  Head: Normocephalic and atraumatic.  Mouth/Throat: No oropharyngeal exudate.  Eyes: EOM are normal. Pupils are equal, round, and reactive to light. No scleral icterus.  Neck: Normal range of motion. Neck supple.  Cardiovascular: Normal rate, regular rhythm and normal heart sounds.   No murmur heard. Pulmonary/Chest: No stridor. No respiratory distress. He has no wheezes.  Abdominal: Soft. Bowel sounds are normal.  Musculoskeletal: Normal range of motion. He exhibits no edema.  Neurological: He is alert and oriented to person, place, and time. No cranial nerve deficit.  Skin: Skin is warm.  Psychiatric: He has a normal mood and affect.        IMAGING    CT chest 01/2015 I have Independently reviewed images of  CT chest   on 08/28/2016 Interpretation:6 MM RUL nodule noted Will need CT chest to assess nodule    ASSESSMENT/PLAN   76 yo white male seen today for several reasons. Patient has untreated OSA and has RUL pulm nodule Patient will need to be re-tested for OSA with CPAP titration ASAP Patient will also need further assessment of RUL pulm nodule  1.will order CPAP titration study with BIPAP as indicated along with oxygen as needed 2.CT chest to assess RUL nodule as patient is at high risk for malignancy based on extensive smoking history  Follow up after tests completed  Patient satisfied with Plan of action and management. All questions answered  Randall Ali, M.D.  [facility]  Medical Director [place] Director [facility] Cardio-Pulmonary Department

## 2016-09-04 DIAGNOSIS — M5033 Other cervical disc degeneration, cervicothoracic region: Secondary | ICD-10-CM | POA: Diagnosis not present

## 2016-09-04 DIAGNOSIS — M5432 Sciatica, left side: Secondary | ICD-10-CM | POA: Diagnosis not present

## 2016-09-04 DIAGNOSIS — M9903 Segmental and somatic dysfunction of lumbar region: Secondary | ICD-10-CM | POA: Diagnosis not present

## 2016-09-04 DIAGNOSIS — M9901 Segmental and somatic dysfunction of cervical region: Secondary | ICD-10-CM | POA: Diagnosis not present

## 2016-09-12 ENCOUNTER — Other Ambulatory Visit: Payer: Self-pay | Admitting: Unknown Physician Specialty

## 2016-09-12 DIAGNOSIS — M9901 Segmental and somatic dysfunction of cervical region: Secondary | ICD-10-CM | POA: Diagnosis not present

## 2016-09-12 DIAGNOSIS — M5033 Other cervical disc degeneration, cervicothoracic region: Secondary | ICD-10-CM | POA: Diagnosis not present

## 2016-09-12 DIAGNOSIS — M9903 Segmental and somatic dysfunction of lumbar region: Secondary | ICD-10-CM | POA: Diagnosis not present

## 2016-09-12 DIAGNOSIS — H9191 Unspecified hearing loss, right ear: Secondary | ICD-10-CM

## 2016-09-12 DIAGNOSIS — M5432 Sciatica, left side: Secondary | ICD-10-CM | POA: Diagnosis not present

## 2016-09-16 ENCOUNTER — Other Ambulatory Visit: Payer: Self-pay | Admitting: Family Medicine

## 2016-09-16 NOTE — Telephone Encounter (Signed)
Ok to refill? Last filled 07/28/16 #[id number] 0RF

## 2016-09-16 NOTE — Telephone Encounter (Signed)
Rx called in as directed.   

## 2016-09-16 NOTE — Telephone Encounter (Signed)
plz phone in. 

## 2016-09-18 DIAGNOSIS — M5432 Sciatica, left side: Secondary | ICD-10-CM | POA: Diagnosis not present

## 2016-09-18 DIAGNOSIS — M5033 Other cervical disc degeneration, cervicothoracic region: Secondary | ICD-10-CM | POA: Diagnosis not present

## 2016-09-18 DIAGNOSIS — M9903 Segmental and somatic dysfunction of lumbar region: Secondary | ICD-10-CM | POA: Diagnosis not present

## 2016-09-18 DIAGNOSIS — M9901 Segmental and somatic dysfunction of cervical region: Secondary | ICD-10-CM | POA: Diagnosis not present

## 2016-09-22 DIAGNOSIS — G4733 Obstructive sleep apnea (adult) (pediatric): Secondary | ICD-10-CM | POA: Diagnosis not present

## 2016-09-23 ENCOUNTER — Ambulatory Visit
Admission: RE | Admit: 2016-09-23 | Discharge: 2016-09-23 | Disposition: A | Discharge status: Home / Self Care | Payer: PPO | Source: Ambulatory Visit | Attending: Unknown Physician Specialty | Admitting: Unknown Physician Specialty

## 2016-09-23 DIAGNOSIS — H918X1 Other specified hearing loss, right ear: Secondary | ICD-10-CM | POA: Insufficient documentation

## 2016-09-23 DIAGNOSIS — H9191 Unspecified hearing loss, right ear: Secondary | ICD-10-CM

## 2016-09-23 LAB — POCT I-STAT CREATININE: Creatinine, Ser: 1.1 mg/dL (ref 0.61–1.24)

## 2016-09-23 MED ORDER — GADOBENATE DIMEGLUMINE 529 MG/ML IV SOLN
19.0000 mL | Freq: Once | INTRAVENOUS | Status: AC | PRN
  Administered 2016-09-23: 19 mL via INTRAVENOUS

## 2016-09-24 DIAGNOSIS — M5432 Sciatica, left side: Secondary | ICD-10-CM | POA: Diagnosis not present

## 2016-09-24 DIAGNOSIS — M5033 Other cervical disc degeneration, cervicothoracic region: Secondary | ICD-10-CM | POA: Diagnosis not present

## 2016-09-24 DIAGNOSIS — M9903 Segmental and somatic dysfunction of lumbar region: Secondary | ICD-10-CM | POA: Diagnosis not present

## 2016-09-24 DIAGNOSIS — M9901 Segmental and somatic dysfunction of cervical region: Secondary | ICD-10-CM | POA: Diagnosis not present

## 2016-09-29 DIAGNOSIS — M5432 Sciatica, left side: Secondary | ICD-10-CM | POA: Diagnosis not present

## 2016-09-29 DIAGNOSIS — M5033 Other cervical disc degeneration, cervicothoracic region: Secondary | ICD-10-CM | POA: Diagnosis not present

## 2016-09-29 DIAGNOSIS — M9901 Segmental and somatic dysfunction of cervical region: Secondary | ICD-10-CM | POA: Diagnosis not present

## 2016-09-29 DIAGNOSIS — M9903 Segmental and somatic dysfunction of lumbar region: Secondary | ICD-10-CM | POA: Diagnosis not present

## 2016-10-01 ENCOUNTER — Ambulatory Visit: Payer: PPO | Attending: Internal Medicine

## 2016-10-01 ENCOUNTER — Encounter: Payer: Self-pay | Admitting: Internal Medicine

## 2016-10-01 DIAGNOSIS — M9903 Segmental and somatic dysfunction of lumbar region: Secondary | ICD-10-CM | POA: Diagnosis not present

## 2016-10-01 DIAGNOSIS — G4733 Obstructive sleep apnea (adult) (pediatric): Secondary | ICD-10-CM | POA: Insufficient documentation

## 2016-10-01 DIAGNOSIS — M5033 Other cervical disc degeneration, cervicothoracic region: Secondary | ICD-10-CM | POA: Diagnosis not present

## 2016-10-01 DIAGNOSIS — M5432 Sciatica, left side: Secondary | ICD-10-CM | POA: Diagnosis not present

## 2016-10-01 DIAGNOSIS — G4761 Periodic limb movement disorder: Secondary | ICD-10-CM | POA: Insufficient documentation

## 2016-10-01 DIAGNOSIS — M9901 Segmental and somatic dysfunction of cervical region: Secondary | ICD-10-CM | POA: Diagnosis not present

## 2016-10-03 ENCOUNTER — Telehealth: Payer: Self-pay | Admitting: *Deleted

## 2016-10-03 DIAGNOSIS — M9903 Segmental and somatic dysfunction of lumbar region: Secondary | ICD-10-CM | POA: Diagnosis not present

## 2016-10-03 DIAGNOSIS — G4733 Obstructive sleep apnea (adult) (pediatric): Secondary | ICD-10-CM | POA: Diagnosis not present

## 2016-10-03 DIAGNOSIS — M5033 Other cervical disc degeneration, cervicothoracic region: Secondary | ICD-10-CM | POA: Diagnosis not present

## 2016-10-03 DIAGNOSIS — M9901 Segmental and somatic dysfunction of cervical region: Secondary | ICD-10-CM | POA: Diagnosis not present

## 2016-10-03 DIAGNOSIS — M5432 Sciatica, left side: Secondary | ICD-10-CM | POA: Diagnosis not present

## 2016-10-03 NOTE — Telephone Encounter (Signed)
Pt informed of results. Order for placed for BiPAP.

## 2016-10-03 NOTE — Telephone Encounter (Signed)
-----   Message from Laverle Hobby, MD sent at 10/03/2016  1:47 PM EDT ----- Regarding: Titration study results.  Recommend Auto-Bi-Level treatment with Minimum pressure setting of 10; maximum pressure of 20; pressure support of 4. [name]

## 2016-10-09 DIAGNOSIS — M9903 Segmental and somatic dysfunction of lumbar region: Secondary | ICD-10-CM | POA: Diagnosis not present

## 2016-10-09 DIAGNOSIS — M5033 Other cervical disc degeneration, cervicothoracic region: Secondary | ICD-10-CM | POA: Diagnosis not present

## 2016-10-09 DIAGNOSIS — M5432 Sciatica, left side: Secondary | ICD-10-CM | POA: Diagnosis not present

## 2016-10-09 DIAGNOSIS — M9901 Segmental and somatic dysfunction of cervical region: Secondary | ICD-10-CM | POA: Diagnosis not present

## 2016-10-13 ENCOUNTER — Telehealth: Payer: Self-pay | Admitting: Internal Medicine

## 2016-10-13 NOTE — Telephone Encounter (Signed)
Patient's sleep study needs CPAP ordered,  But he has transferred care to Dr Danise Mina so we will send the report and order to him.  Let patinet know that it was positive .  Report in red folder

## 2016-10-13 NOTE — Telephone Encounter (Signed)
Paperwork has been faxed to the correct provider and pt has been notified per Dr. Derrel Nip.

## 2016-10-14 NOTE — Telephone Encounter (Signed)
Will await sleep study.

## 2016-10-15 NOTE — Telephone Encounter (Signed)
Rx filled out and placed in my CMA's box. plz notify pt we will be sending this in, to update [place] if has not heard from [facility] in 1-2 wks.  plz scan copy for chart. plz send copy to pulm Dr Mortimer Fries.

## 2016-10-17 NOTE — Telephone Encounter (Signed)
Per [name], a new order is to be faxed and completed. Old form included Dr Demetrios Isaacs info. Will await form for completion

## 2016-10-20 ENCOUNTER — Ambulatory Visit: Payer: PPO | Admitting: Cardiovascular Disease

## 2016-10-20 DIAGNOSIS — T814XXA Infection following a procedure, initial encounter: Secondary | ICD-10-CM | POA: Diagnosis not present

## 2016-10-20 DIAGNOSIS — A498 Other bacterial infections of unspecified site: Secondary | ICD-10-CM | POA: Diagnosis not present

## 2016-10-22 DIAGNOSIS — G4733 Obstructive sleep apnea (adult) (pediatric): Secondary | ICD-10-CM | POA: Diagnosis not present

## 2016-10-22 NOTE — Progress Notes (Signed)
Cardiology Office Note  Date:  10/23/2016   ID:  Randall Kung., DOB [birth year], MRN [id number]  PCP:  Ria Bush, MD   Chief Complaint  Patient presents with  . other    6 month follow up. Meds reviewed by the pt. verbally. 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  Previous admit in 2011 for mild chest pain and sweating.  Stress test showed no ischemia with inferior scar.  Previous cardiac catheterization showing moderate OM disease.  Repeat catheterization  in August 2016 showing severe three-vessel disease,  sent for CABG on 01/24/2015.  He presents today for follow-up of his coronary artery disease  GERD sx,  Once every few weeks Off the omeprazole And he is not taking his dexilant  we did review his previous EGD from 07/2014 showing moderate to severe esophagitis Images pulled up in the office He is unclear whether to restart his medication given his improved symptoms   unclear if there was a change in his diet, he does not know what made the difference   New mask for OSA, Done three nights this week, better  Active at baseline, no symptoms concerning for angina Back pain, ,hip pain, plays softball Goes to gym, rides bike  Lab work reviewed with him in detail Total chol 127, LDL 67  EKG personally reviewed by myself on today's visit shows normal sinus rhythm with rate 63 bpm, no significant ST or T-wave changes  Other past medical history reviewed postoperative atrial fibrillation,  postoperative delirium and began having fevers on 01/29/2015.  some drainage from the inferior portion of his sternal wound. Blood cultures and wound cultures grew Enterobacter aerogenes. He was discharged on IV ceftriaxone  and completed 6 weeks of therapy . He took probiotics during this time  right foot surgery  in May 2016. repeat hardware fusion of his right great toe and had hammertoe repair of his right second toe.   postoperative infection with persistent drainage from the second toe. A culture in late July grew MSSA. course of doxycycline.  The drainage stopped but he has continued to have redness and swelling  He still has some pain when walking   In 2013, he was having worsening chest pain medically with exertion.  cardiac catheterization that showed moderate obtuse marginal disease, otherwise no significant stenosis. This was discussed with the interventional physicians in [facility] and recommendation was made for medical management.  prior EGD  that showed an ulcer. Previously had trouble swallowing.   PMH:   has a past medical history of Allergy; Arthritis; CAD (coronary artery disease); Cancer ([place]); Cataract; Chest pain; Erectile dysfunction; GERD (gastroesophageal reflux disease); HLD (hyperlipidemia); HTN (hypertension); Inferior myocardial infarction ([place]) (10/01); OSA (obstructive sleep apnea) (01/13/2015); Postoperative wound infection (02/02/2015); Reflux esophagitis; Sleep apnea (2017); and SOB (shortness of breath) on exertion.  PSH:    Past Surgical History:  Procedure Laterality Date  . arm surgery  2010  . CARDIAC CATHETERIZATION  06/24/11  . CARDIAC CATHETERIZATION N/A 01/18/2015   Procedure: Left Heart Cath with coronary angiography;  Surgeon: Minna Merritts, MD;  Location: [place] [facility];  Service: Cardiovascular;  Laterality: N/A;  . CARDIAC CATHETERIZATION N/A 01/18/2015   Procedure: Intravascular Pressure Wire/FFR Study;  Surgeon: [name], MD;  Location: [place] [facility];  Service: Cardiovascular;  Laterality: N/A;  . CAROTID STENT  03/10/2011  . COLONOSCOPY  2010  . CORONARY ARTERY BYPASS GRAFT N/A 01/24/2015   Procedure: CORONARY ARTERY BYPASS GRAFTING x 4 (LIMA-LAD, SVG-Int 1- Int 2, SVG-PD) ENDOSCOPIC GREATER SAPHENOUS VEIN HARVEST LEFT LEG;   Surgeon: [name], MD;  Location: [place];  Service: Open Heart Surgery;  Laterality: N/A;  . ESOPHAGOGASTRODUODENOSCOPY (EGD) WITH PROPOFOL N/A 04/24/2016   Procedure: ESOPHAGOGASTRODUODENOSCOPY (EGD) WITH PROPOFOL;  Surgeon: Jerene Bears, MD;  Location: [facility];  Service: Gastroenterology;  Laterality: N/A;  . EYE SURGERY     lasik 15 yrs. ago, cataracts removed - both eyes   . HAMMER TOE SURGERY     right toe  . NASAL SINUS SURGERY  2008   septpolasty, bilateral turbinate reduction  . SHOULDER ARTHROSCOPY  2012  . TEE WITHOUT CARDIOVERSION N/A 01/24/2015   Procedure: TRANSESOPHAGEAL ECHOCARDIOGRAM (TEE);  Surgeon: [name], MD;  Location: [place];  Service: Open Heart Surgery;  Laterality: N/A;  . [place]  . WRIST SURGERY  2011    Current Outpatient Prescriptions  Medication Sig Dispense Refill  . aspirin 81 MG EC tablet Take 81 mg by mouth daily.      . Calcium Carbonate-Vitamin D (CALCIUM-VITAMIN D) 600-125 MG-UNIT TABS Take 1 tablet by mouth daily.    . cholecalciferol (VITAMIN D) 1000 units tablet Take 1,000 Units by mouth daily.    [name] dexlansoprazole (DEXILANT) 60 MG capsule Take 1 capsule (60 mg total) by mouth daily. 30 capsule 3  . lisinopril (PRINIVIL,ZESTRIL) 10 MG tablet Take 1 tablet (10 mg total) by mouth daily. 90 tablet 3  . metoprolol succinate (TOPROL XL) 25 MG 24 hr tablet Take 1 tablet (25 mg total) by mouth daily. 90 tablet 4  . nitroGLYCERIN (NITROSTAT) 0.4 MG SL tablet Place 1 tablet (0.4 mg total) under the tongue every 5 (five) minutes as needed for chest pain. 25 tablet 3  . simvastatin (ZOCOR) 40 MG tablet Take 1 tablet (40 mg total) by mouth at bedtime. 90 tablet 3  . traMADol (ULTRAM) 50 MG tablet TAKE 1 TABLET BY MOUTH EVERY SIX HOURS AS NEEDED 90 tablet 0  . vitamin B-12 (CYANOCOBALAMIN) 1000 MCG tablet Take 1,000 mcg by mouth daily.    . vitamin C (ASCORBIC ACID) 500 MG tablet Take 500 mg by mouth daily.    . Zinc 100 MG TABS  Take 1 tablet by mouth daily.      [name] zolpidem (AMBIEN) 5 MG tablet Take 1 tablet (5 mg total) by mouth at bedtime as needed for sleep. 30 tablet 5   No current facility-administered medications for this visit.      Allergies:   Patient has no known allergies.   Social History:  The patient  reports that he quit smoking about 16 years ago. His smoking use included Cigarettes. He has a 100.00 pack-year smoking history. He has never used smokeless tobacco. He reports that he drinks about 6.0 oz of alcohol per week . He reports that he does not use drugs.   Family History:   family history includes Cancer in his paternal grandfather; Diabetes in his father; Heart disease in his mother; Heart disease (age of onset: 83) in his brother; Hypertension in his father and mother.    Review of Systems: Review of Systems  Constitutional: Negative.   Respiratory: Negative.   Cardiovascular: Negative.   Gastrointestinal: Negative.   Musculoskeletal: Negative.   Neurological:  Negative.   Psychiatric/Behavioral: Negative.   All other systems reviewed and are negative.    PHYSICAL EXAM: VS:  BP 122/78 (BP Location: Left Arm, Patient Position: Sitting, Cuff Size: Normal)   Pulse 63   Ht '5\' 8"'$  (1.727 m)   Wt 203 lb 8 oz (92.3 kg)   BMI 30.94 kg/m  , BMI Body mass index is 30.94 kg/m.  GEN: Well nourished, well developed, in no acute distress , Obese  HEENT: normal  Neck: no JVD, carotid bruits, or masses Cardiac: RRR; no murmurs, rubs, or gallops,no edema  Respiratory:  clear to auscultation bilaterally, normal work of breathing GI: soft, nontender, nondistended, + BS MS: no deformity or atrophy  Skin: warm and dry, no rash Neuro:  Strength and sensation are intact Psych: euthymic mood, full affect    Recent Labs: 02/06/2016: ALT 16; Hemoglobin 14.3; Platelets 215.0 07/14/2016: BUN 17; Potassium 4.8; Sodium 141 09/23/2016: Creatinine, Ser 1.10    Lipid Panel Lab Results  Component  Value Date   CHOL 127 07/14/2016   HDL 47.30 07/14/2016   LDLCALC 66 07/14/2016   TRIG 65.0 07/14/2016      Wt Readings from Last 3 Encounters:  10/23/16 203 lb 8 oz (92.3 kg)  08/28/16 204 lb (92.5 kg)  08/04/16 208 lb 1.9 oz (94.4 kg)       ASSESSMENT AND PLAN:   Atherosclerosis of native coronary artery of native heart without angina pectoris - Plan: EKG 12-Lead Currently with no symptoms of angina. No further workup at this time. Continue current medication regimen.  Paroxysmal atrial fibrillation (HCC) - Plan: EKG 12-Lead Prior history of postop A. fib after bypass surgery  no symptoms concerning for arrhythmia   PAD (peripheral artery disease) (HCC)  mild carotid arterial disease seen in 2016  Medical management   Pure hypercholesterolemia Cholesterol is at goal on the current lipid regimen. No changes to the medications were made.  Essential hypertension Blood pressure is well controlled on today's visit. No changes made to the medications.  S/P CABG x 4 No anginal symptoms, active, exercising No further workup at this time  GERD  long discussion with him today he did have moderate to severe esophagitis on EGD 2 years ago   now rare symptoms every couple weeks but is not taking his proton pump inhibitor   recommended he discuss this with Dr. Danise Mina whether he needs to stay on medication    Total encounter time more than 15 minutes  Greater than 50% was spent in counseling and coordination of care with the patient   Total encounter time more than 25 minutes  Greater than 50% was spent in counseling and coordination of care with the patient  Disposition:   F/U  12 months   Orders Placed This Encounter  Procedures  . EKG 12-Lead     Signed, Esmond Plants, M.D., Ph.D. 10/23/2016  [place], [place]

## 2016-10-23 ENCOUNTER — Encounter: Payer: Self-pay | Admitting: Cardiovascular Disease

## 2016-10-23 ENCOUNTER — Ambulatory Visit (INDEPENDENT_AMBULATORY_CARE_PROVIDER_SITE_OTHER): Payer: PPO | Admitting: Cardiovascular Disease

## 2016-10-23 VITALS — BP 122/78 | HR 63 | Ht 68.0 in | Wt 203.5 lb

## 2016-10-23 DIAGNOSIS — I10 Essential (primary) hypertension: Secondary | ICD-10-CM

## 2016-10-23 DIAGNOSIS — I480 Paroxysmal atrial fibrillation: Secondary | ICD-10-CM | POA: Diagnosis not present

## 2016-10-23 DIAGNOSIS — E782 Mixed hyperlipidemia: Secondary | ICD-10-CM

## 2016-10-23 DIAGNOSIS — Z951 Presence of aortocoronary bypass graft: Secondary | ICD-10-CM

## 2016-10-23 DIAGNOSIS — I739 Peripheral vascular disease, unspecified: Secondary | ICD-10-CM

## 2016-10-23 DIAGNOSIS — I25111 Atherosclerotic heart disease of native coronary artery with angina pectoris with documented spasm: Secondary | ICD-10-CM

## 2016-10-23 NOTE — Patient Instructions (Addendum)

## 2016-10-24 DIAGNOSIS — M9903 Segmental and somatic dysfunction of lumbar region: Secondary | ICD-10-CM | POA: Diagnosis not present

## 2016-10-24 DIAGNOSIS — M5432 Sciatica, left side: Secondary | ICD-10-CM | POA: Diagnosis not present

## 2016-10-24 DIAGNOSIS — M5033 Other cervical disc degeneration, cervicothoracic region: Secondary | ICD-10-CM | POA: Diagnosis not present

## 2016-10-24 DIAGNOSIS — M9901 Segmental and somatic dysfunction of cervical region: Secondary | ICD-10-CM | POA: Diagnosis not present

## 2016-10-28 ENCOUNTER — Other Ambulatory Visit: Payer: Self-pay | Admitting: Family Medicine

## 2016-10-28 NOTE — Telephone Encounter (Signed)
Received refill electronically Last refill 09/16/16 #90 Last office visit 08/04/16

## 2016-10-28 NOTE — Telephone Encounter (Signed)
plz phone in. 

## 2016-10-29 NOTE — Telephone Encounter (Signed)
Phoned In

## 2016-11-05 DIAGNOSIS — M5432 Sciatica, left side: Secondary | ICD-10-CM | POA: Diagnosis not present

## 2016-11-05 DIAGNOSIS — M9903 Segmental and somatic dysfunction of lumbar region: Secondary | ICD-10-CM | POA: Diagnosis not present

## 2016-11-05 DIAGNOSIS — M5033 Other cervical disc degeneration, cervicothoracic region: Secondary | ICD-10-CM | POA: Diagnosis not present

## 2016-11-05 DIAGNOSIS — M9901 Segmental and somatic dysfunction of cervical region: Secondary | ICD-10-CM | POA: Diagnosis not present

## 2016-11-07 DIAGNOSIS — M5033 Other cervical disc degeneration, cervicothoracic region: Secondary | ICD-10-CM | POA: Diagnosis not present

## 2016-11-07 DIAGNOSIS — M5432 Sciatica, left side: Secondary | ICD-10-CM | POA: Diagnosis not present

## 2016-11-07 DIAGNOSIS — M9903 Segmental and somatic dysfunction of lumbar region: Secondary | ICD-10-CM | POA: Diagnosis not present

## 2016-11-07 DIAGNOSIS — M9901 Segmental and somatic dysfunction of cervical region: Secondary | ICD-10-CM | POA: Diagnosis not present

## 2016-11-18 ENCOUNTER — Encounter: Payer: Self-pay | Admitting: Internal Medicine

## 2016-11-19 DIAGNOSIS — M5033 Other cervical disc degeneration, cervicothoracic region: Secondary | ICD-10-CM | POA: Diagnosis not present

## 2016-11-19 DIAGNOSIS — M9901 Segmental and somatic dysfunction of cervical region: Secondary | ICD-10-CM | POA: Diagnosis not present

## 2016-11-19 DIAGNOSIS — M9903 Segmental and somatic dysfunction of lumbar region: Secondary | ICD-10-CM | POA: Diagnosis not present

## 2016-11-19 DIAGNOSIS — M5432 Sciatica, left side: Secondary | ICD-10-CM | POA: Diagnosis not present

## 2016-11-20 ENCOUNTER — Ambulatory Visit
Admission: RE | Admit: 2016-11-20 | Discharge: 2016-11-20 | Disposition: A | Discharge status: Home / Self Care | Payer: PPO | Source: Ambulatory Visit | Attending: Internal Medicine | Admitting: Internal Medicine

## 2016-11-20 ENCOUNTER — Telehealth: Payer: Self-pay | Admitting: Internal Medicine

## 2016-11-20 DIAGNOSIS — J439 Emphysema, unspecified: Secondary | ICD-10-CM | POA: Diagnosis not present

## 2016-11-20 DIAGNOSIS — R911 Solitary pulmonary nodule: Secondary | ICD-10-CM | POA: Diagnosis not present

## 2016-11-20 DIAGNOSIS — I700 Atherosclerosis of aorta: Secondary | ICD-10-CM | POA: Insufficient documentation

## 2016-11-20 DIAGNOSIS — T814XXA Infection following a procedure, initial encounter: Secondary | ICD-10-CM | POA: Diagnosis not present

## 2016-11-20 DIAGNOSIS — A498 Other bacterial infections of unspecified site: Secondary | ICD-10-CM | POA: Diagnosis not present

## 2016-11-20 NOTE — Telephone Encounter (Signed)
Pt would like to talk with someone regarding his "sleep machine". States he is getting no better.

## 2016-11-20 NOTE — Telephone Encounter (Signed)
Pt states that he is still feeling tired and it isn't getting any better. Informed pt to keep his appt for 6/19 and DK will make changes as needed at that time. Pt verbalized understanding. Pulled up pt's compliance report and pt has worn his CPAP 3 days out of the last 30 days. Nothing further needed at this time.

## 2016-11-22 DIAGNOSIS — G4733 Obstructive sleep apnea (adult) (pediatric): Secondary | ICD-10-CM | POA: Diagnosis not present

## 2016-11-24 DIAGNOSIS — M9903 Segmental and somatic dysfunction of lumbar region: Secondary | ICD-10-CM | POA: Diagnosis not present

## 2016-11-24 DIAGNOSIS — M5432 Sciatica, left side: Secondary | ICD-10-CM | POA: Diagnosis not present

## 2016-11-24 DIAGNOSIS — M9901 Segmental and somatic dysfunction of cervical region: Secondary | ICD-10-CM | POA: Diagnosis not present

## 2016-11-24 DIAGNOSIS — M5033 Other cervical disc degeneration, cervicothoracic region: Secondary | ICD-10-CM | POA: Diagnosis not present

## 2016-11-25 ENCOUNTER — Encounter: Payer: Self-pay | Admitting: Internal Medicine

## 2016-11-25 ENCOUNTER — Ambulatory Visit (INDEPENDENT_AMBULATORY_CARE_PROVIDER_SITE_OTHER): Payer: PPO | Admitting: Internal Medicine

## 2016-11-25 VITALS — BP 144/82 | HR 64 | Ht 68.0 in | Wt 204.0 lb

## 2016-11-25 DIAGNOSIS — G4733 Obstructive sleep apnea (adult) (pediatric): Secondary | ICD-10-CM | POA: Diagnosis not present

## 2016-11-25 NOTE — Patient Instructions (Signed)
Continue BiPAP therapy for sleep apnea as prescribed Follow-up in 6 months

## 2016-11-25 NOTE — Progress Notes (Signed)
[facility] Consultation      Date: 11/25/2016,   MRN# [id number] Randall Ali. 75/27/1942 Code Status:  Code Status History    Date Active Date Inactive Code Status Order ID Comments User Context   01/24/2015  2:11 PM 02/06/2015 11:28 PM Full Code [id number]  Coolidge Breeze, PA-C Inpatient   01/18/2015 12:22 PM 01/18/2015  8:09 PM Full Code [id number]  [name], MD Inpatient   01/05/2015  9:01 PM 01/06/2015  5:21 PM Full Code [id number]  Baxter Hire, MD Inpatient     Hosp day:@LENGTHOFSTAYDAYS @ Referring MD: @ATDPROV @     PCP:      AdmissionWeight: 204 lb (92.5 kg)                 CurrentWeight: 204 lb (92.5 kg) Randall Ali. is a 76 y.o. old male seen in consultation for sleep problems at the request of Dr. Christean Grief.     CHIEF COMPLAINT:   I have sleep issues, and sleep apnea   HISTORY OF PRESENT ILLNESS   76 yo white male with problems with sleep dx with OSA 1year ago Patient has been diagnosed with sleep apnea Patient is on BiPAP therapy with IPAP of 20 and EPAP of 10 Patient has been 93 compliants rate of usage days along with 83 present percent compliance greater than 4 hours per day  AHI is 2.6   Although patient's OSA is under control and adapting to therapy patient still  with problems with sleep  +excessive daytime sleepiness +Patient has been having extreme fatigue and tiredness, lack of energy  Patient is former heavy smoker 3-4 ppd for 50 years quit 2001 s/p CABg 2016  He has no acuet resp issues at this time No SOB, no DOE, no fevers, chills No signs of infection   There is a CT chest in 2016 that shows RUL nodule approx 6 MM Patient states that it has been there for many years but no follow up in 2 years   CT chest November 20, 2016 Shows right upper lobe nodule approximately 6.9 mm which has not changed since 2016 Patient has known about this right upper lobe nodule for the last 6 years After further  evaluation right upper lobe lung nodule is benign        Current Medication:  Current Outpatient Prescriptions:  .  aspirin 81 MG EC tablet, Take 81 mg by mouth daily.  , Disp: , Rfl:  .  Calcium Carbonate-Vitamin D (CALCIUM-VITAMIN D) 600-125 MG-UNIT TABS, Take 1 tablet by mouth daily., Disp: , Rfl:  .  cholecalciferol (VITAMIN D) 1000 units tablet, Take 1,000 Units by mouth daily., Disp: , Rfl:  .  dexlansoprazole (DEXILANT) 60 MG capsule, Take 1 capsule (60 mg total) by mouth daily., Disp: 30 capsule, Rfl: 3 .  lisinopril (PRINIVIL,ZESTRIL) 10 MG tablet, Take 1 tablet (10 mg total) by mouth daily., Disp: 90 tablet, Rfl: 3 .  metoprolol succinate (TOPROL XL) 25 MG 24 hr tablet, Take 1 tablet (25 mg total) by mouth daily., Disp: 90 tablet, Rfl: 4 .  nitroGLYCERIN (NITROSTAT) 0.4 MG SL tablet, Place 1 tablet (0.4 mg total) under the tongue every 5 (five) minutes as needed for chest pain., Disp: 25 tablet, Rfl: 3 .  simvastatin (ZOCOR) 40 MG tablet, Take 1 tablet (40 mg total) by mouth at bedtime., Disp: 90 tablet, Rfl: 3 .  traMADol (ULTRAM) 50 MG tablet, TAKE 1 TABLET BY MOUTH EVERY SIX HOURS  AS NEEDED, Disp: 90 tablet, Rfl: 0 .  vitamin B-12 (CYANOCOBALAMIN) 1000 MCG tablet, Take 1,000 mcg by mouth daily., Disp: , Rfl:  .  vitamin C (ASCORBIC ACID) 500 MG tablet, Take 500 mg by mouth daily., Disp: , Rfl:  .  Zinc 100 MG TABS, Take 1 tablet by mouth daily.  , Disp: , Rfl:  .  zolpidem (AMBIEN) 5 MG tablet, Take 1 tablet (5 mg total) by mouth at bedtime as needed for sleep., Disp: 30 tablet, Rfl: 5    ALLERGIES   Patient has no known allergies.     REVIEW OF SYSTEMS   Review of Systems  Constitutional: Positive for malaise/fatigue. Negative for chills, diaphoresis, fever and weight loss.  HENT: Negative for congestion and hearing loss.   Respiratory: Negative for cough, hemoptysis, sputum production, shortness of breath and wheezing.   Cardiovascular: Negative for chest pain,  palpitations and orthopnea.  Gastrointestinal: Negative for heartburn.  Skin: Negative for rash.  Neurological: Negative for weakness.  All other systems reviewed and are negative.  BP (!) 144/82 (BP Location: Left Arm, Cuff Size: Normal)   Pulse 64   Ht 5\' 8"  (1.727 m)   Wt 204 lb (92.5 kg)   SpO2 96%   BMI 31.02 kg/m     PHYSICAL EXAM  Physical Exam  Constitutional: He is oriented to person, place, and time. He appears well-developed and well-nourished. No distress.  Eyes: No scleral icterus.  Neck: Neck supple.  Cardiovascular: Normal rate, regular rhythm and normal heart sounds.   No murmur heard. Pulmonary/Chest: Effort normal and breath sounds normal. No stridor. No respiratory distress. He has no wheezes.  Musculoskeletal: Normal range of motion. He exhibits no edema.  Neurological: He is alert and oriented to person, place, and time. No cranial nerve deficit.  Skin: Skin is warm.  Psychiatric: He has a normal mood and affect.        IMAGING    CT chest November 20, 2016 I have Independently reviewed images of  CT chest   on 11/25/2016 Interpretation:6.9 MM RUL nodule noted Has not change in size of the last 2 years    ASSESSMENT/PLAN   76 yo white male seen today for For follow-up OSA and follow-up for right upper lobe lung nodule in the setting of obesity and deconditioned state  #1 excessive daytime sleepiness and fatigue related to sleep apnea At this time his OSA is under control with AHI of 2.6 patient currently on BiPAP therapy Patient is doing well with this regimen, he says the mask is fitting well and has no issues at this time His fatigue and sleepiness is not related to his underlying sleep apnea  #2 right upper lobe lung nodule noted to be unchanged in size for the last 2 years Patient has known about this right upper lobe lung nodule for the last 6 years This is benign in nature No further CT imaging needed at this time  #3 Obesity -recommend  significant weight loss -recommend changing diet  #4 Deconditioned state -Recommend increased daily activity and exercise   Patient satisfied with Plan of action and management. All questions answered Follow-up in 6 months Randall Ali [name], M.D.  [facility]  Medical Director [place] Director [facility] Cardio-Pulmonary Department

## 2016-11-27 ENCOUNTER — Encounter: Payer: Self-pay | Admitting: Family Medicine

## 2016-11-27 ENCOUNTER — Ambulatory Visit (INDEPENDENT_AMBULATORY_CARE_PROVIDER_SITE_OTHER): Payer: PPO | Admitting: Family Medicine

## 2016-11-27 VITALS — BP 140/84 | HR 60 | Temp 97.6°F | Ht 68.0 in | Wt 205.0 lb

## 2016-11-27 DIAGNOSIS — R4584 Anhedonia: Secondary | ICD-10-CM | POA: Diagnosis not present

## 2016-11-27 DIAGNOSIS — R5381 Other malaise: Secondary | ICD-10-CM

## 2016-11-27 DIAGNOSIS — R5382 Chronic fatigue, unspecified: Secondary | ICD-10-CM | POA: Diagnosis not present

## 2016-11-27 LAB — CBC WITH DIFFERENTIAL/PLATELET
Basophils Absolute: 0.1 10*3/uL (ref 0.0–0.1)
Basophils Relative: 0.7 % (ref 0.0–3.0)
Eosinophils Absolute: 0.1 10*3/uL (ref 0.0–0.7)
Eosinophils Relative: 1.7 % (ref 0.0–5.0)
HCT: 43.2 % (ref 39.0–52.0)
Hemoglobin: 14.7 g/dL (ref 13.0–17.0)
Lymphocytes Relative: 31.5 % (ref 12.0–46.0)
Lymphs Abs: 2.3 10*3/uL (ref 0.7–4.0)
MCHC: 33.9 g/dL (ref 30.0–36.0)
MCV: 90.7 fl (ref 78.0–100.0)
Monocytes Absolute: 0.8 10*3/uL (ref 0.1–1.0)
Monocytes Relative: 10.9 % (ref 3.0–12.0)
Neutro Abs: 4.1 10*3/uL (ref 1.4–7.7)
Neutrophils Relative %: 55.2 % (ref 43.0–77.0)
Platelets: 204 10*3/uL (ref 150.0–400.0)
RBC: 4.77 Mil/uL (ref 4.22–5.81)
RDW: 13.3 % (ref 11.5–15.5)
WBC: 7.4 10*3/uL (ref 4.0–10.5)

## 2016-11-27 LAB — COMPREHENSIVE METABOLIC PANEL
ALT: 16 U/L (ref 0–53)
AST: 14 U/L (ref 0–37)
Albumin: 4.4 g/dL (ref 3.5–5.2)
Alkaline Phosphatase: 51 U/L (ref 39–117)
BUN: 20 mg/dL (ref 6–23)
CO2: 27 mEq/L (ref 19–32)
Calcium: 10.1 mg/dL (ref 8.4–10.5)
Chloride: 104 mEq/L (ref 96–112)
Creatinine, Ser: 1.09 mg/dL (ref 0.40–1.50)
GFR: 70.14 mL/min (ref >60.00)
Glucose, Bld: 88 mg/dL (ref 70–99)
Potassium: 4.2 mEq/L (ref 3.5–5.1)
Sodium: 138 mEq/L (ref 135–145)
Total Bilirubin: 0.5 mg/dL (ref 0.2–1.2)
Total Protein: 6.7 g/dL (ref 6.0–8.3)

## 2016-11-27 LAB — VITAMIN B12: Vitamin B-12: 427 pg/mL (ref 211–911)

## 2016-11-27 LAB — VITAMIN D 25 HYDROXY (VIT D DEFICIENCY, FRACTURES): VITD: 47.26 ng/mL (ref 30.00–100.00)

## 2016-11-27 LAB — SEDIMENTATION RATE: Sed Rate: 8 mm/hr (ref 0–20)

## 2016-11-27 LAB — TSH: TSH: 1.32 u[IU]/mL (ref 0.35–4.50)

## 2016-11-27 MED ORDER — TRAMADOL HCL 50 MG PO TABS: 50.0000 mg | ORAL_TABLET | Freq: Three times a day (TID) | ORAL | 0 refills | Status: DC | PRN

## 2016-11-27 NOTE — Assessment & Plan Note (Signed)
Ongoing fatigue, low energy, anhedonia. No improvement despite compliance with CPAP. Will check labs for reversible causes of fatigue. If unrevealing, discussed possible mood disorder as cause - would consider wellbutrin trial for anhedonia and dysthymia. Pt agrees with plan.

## 2016-11-27 NOTE — Patient Instructions (Signed)
Labs today to check for reversible causes of fatigue. Possible depressed mood could be cause of lack of motivation and low energy - if labs all normal, we may try trial of mood medicine called wellbutrin which can help with energy levels as well.

## 2016-11-27 NOTE — Progress Notes (Signed)
BP 140/84   Pulse 60   Temp 97.6 F (36.4 C)   Ht 5\' 8"  (1.727 m)   Wt 205 lb (93 kg)   SpO2 96%   BMI 31.17 kg/m    CC: no energy Subjective:    Patient ID: Randall Kung., male    DOB: [birth year], 76 y.o.   MRN: [id number]  HPI: Randall Madole. is a 76 y.o. male presenting on 11/27/2016 for Acute Visit (no energy-- not feeling well)   Several month history of fatigue, low energy, worsening over last few weeks. Thinks this may have started since CABG surgery 01/2015. He did not complete cardiac rehab - just continued home aerobic regimen.  Denies triggering lifestyle changes, new family stressors.  He continues going to gym QOD - weight lifting. Cardio limited by R foot pain  Some anhedonia, denies significant depression. Appetite ok, denies guilt feelings, concentration ok. Sleeping ok. Denies anxiety. Denies SI/HI.   Some intermittent GERD. Headaches with straining eyes.  Denies fevers/chills, unexpected weight loss, night sweats, chest pain, dyspnea, palpitations, dizziness. No recent tick bites. Denies abd pain, n/v/d/c, blood in stool or urine. No myalgias  Saw pulm yesterday - OSA under good control with CPAP. Not cause of fatigue.   Requests tramadol refill.   Relevant past medical, surgical, family and social history reviewed and updated as indicated. Interim medical history since our last visit reviewed. Allergies and medications reviewed and updated. Outpatient Medications Prior to Visit  Medication Sig Dispense Refill  . aspirin 81 MG EC tablet Take 81 mg by mouth daily.      . Calcium Carbonate-Vitamin D (CALCIUM-VITAMIN D) 600-125 MG-UNIT TABS Take 1 tablet by mouth daily.    . cholecalciferol (VITAMIN D) 1000 units tablet Take 1,000 Units by mouth daily.    [name] dexlansoprazole (DEXILANT) 60 MG capsule Take 1 capsule (60 mg total) by mouth daily. 30 capsule 3  . lisinopril (PRINIVIL,ZESTRIL) 10 MG tablet Take 1 tablet (10 mg total) by mouth daily. 90  tablet 3  . metoprolol succinate (TOPROL XL) 25 MG 24 hr tablet Take 1 tablet (25 mg total) by mouth daily. 90 tablet 4  . nitroGLYCERIN (NITROSTAT) 0.4 MG SL tablet Place 1 tablet (0.4 mg total) under the tongue every 5 (five) minutes as needed for chest pain. 25 tablet 3  . simvastatin (ZOCOR) 40 MG tablet Take 1 tablet (40 mg total) by mouth at bedtime. 90 tablet 3  . vitamin B-12 (CYANOCOBALAMIN) 1000 MCG tablet Take 1,000 mcg by mouth daily.    . vitamin C (ASCORBIC ACID) 500 MG tablet Take 500 mg by mouth daily.    . Zinc 100 MG TABS Take 1 tablet by mouth daily.      [name] zolpidem (AMBIEN) 5 MG tablet Take 1 tablet (5 mg total) by mouth at bedtime as needed for sleep. 30 tablet 5  . traMADol (ULTRAM) 50 MG tablet TAKE 1 TABLET BY MOUTH EVERY SIX HOURS AS NEEDED 90 tablet 0   No facility-administered medications prior to visit.      Per HPI unless specifically indicated in ROS section below Review of Systems     Objective:    BP 140/84   Pulse 60   Temp 97.6 F (36.4 C)   Ht 5\' 8"  (1.727 m)   Wt 205 lb (93 kg)   SpO2 96%   BMI 31.17 kg/m   Wt Readings from Last 3 Encounters:  11/27/16 205 lb (93 kg)  11/25/16 204 lb (92.5 kg)  10/23/16 203 lb 8 oz (92.3 kg)    Physical Exam  Constitutional: He appears well-developed and well-nourished. No distress.  HENT:  Head: Normocephalic and atraumatic.  Mouth/Throat: Oropharynx is clear and moist. No oropharyngeal exudate.  Eyes: Conjunctivae and EOM are normal. Pupils are equal, round, and reactive to light.  Neck: Normal range of motion. Neck supple. No thyromegaly present.  Cardiovascular: Normal rate, regular rhythm, normal heart sounds and intact distal pulses.   No murmur heard. Pulmonary/Chest: Effort normal and breath sounds normal. No respiratory distress. He has no wheezes. He has no rales.  Abdominal: Soft. Bowel sounds are normal. He exhibits no distension and no mass. There is no tenderness. There is no rebound and no  guarding.  Musculoskeletal: He exhibits no edema.  Lymphadenopathy:    He has no cervical adenopathy.  Skin: Skin is warm and dry. No rash noted.  Psychiatric: He has a normal mood and affect.  Nursing note and vitals reviewed.  Results for orders placed or performed during the hospital encounter of 09/23/16  I-STAT creatinine  Result Value Ref Range   Creatinine, Ser 1.10 0.61 - 1.24 mg/dL      Assessment & Plan:   Problem List Items Addressed This Visit    Chronic fatigue and malaise - Primary    Ongoing fatigue, low energy, anhedonia. No improvement despite compliance with CPAP. Will check labs for reversible causes of fatigue. If unrevealing, discussed possible mood disorder as cause - would consider wellbutrin trial for anhedonia and dysthymia. Pt agrees with plan.       Relevant Orders   Comprehensive metabolic panel   TSH   CBC with Differential/Platelet   Sedimentation rate   Vitamin B12   VITAMIN D 25 Hydroxy (Vit-D Deficiency, Fractures)    Other Visit Diagnoses    Anhedonia           Follow up plan: Return if symptoms worsen or fail to improve.  Ria Bush, MD

## 2016-11-30 ENCOUNTER — Other Ambulatory Visit: Payer: Self-pay | Admitting: Family Medicine

## 2016-11-30 MED ORDER — BUPROPION HCL ER (SR) 100 MG PO TB12: 100.0000 mg | ORAL_TABLET | Freq: Two times a day (BID) | ORAL | 3 refills | Status: DC

## 2016-12-01 ENCOUNTER — Telehealth: Payer: Self-pay

## 2016-12-01 NOTE — Telephone Encounter (Signed)
No known h/o seizures. I think ok to try both. Would want him to use tramadol as sparingly as possible

## 2016-12-01 NOTE — Telephone Encounter (Signed)
[employer] called back requesting name of person spoke with about possible interaction; they did not get name of person who called back. Advised Margarite Gouge CMA.

## 2016-12-01 NOTE — Telephone Encounter (Signed)
Drew at [employer] left v/m that wellbutrin and tramadol can have interaction of possible seizures. Drew request cb.

## 2016-12-01 NOTE — Telephone Encounter (Signed)
Spoke to pharmacist and advised per Dr Danise Mina

## 2016-12-03 DIAGNOSIS — Z85828 Personal history of other malignant neoplasm of skin: Secondary | ICD-10-CM | POA: Diagnosis not present

## 2016-12-03 DIAGNOSIS — C44519 Basal cell carcinoma of skin of other part of trunk: Secondary | ICD-10-CM | POA: Diagnosis not present

## 2016-12-03 DIAGNOSIS — L570 Actinic keratosis: Secondary | ICD-10-CM | POA: Diagnosis not present

## 2016-12-03 DIAGNOSIS — L0212 Furuncle of neck: Secondary | ICD-10-CM | POA: Diagnosis not present

## 2016-12-03 DIAGNOSIS — X32XXXA Exposure to sunlight, initial encounter: Secondary | ICD-10-CM | POA: Diagnosis not present

## 2016-12-03 DIAGNOSIS — D3701 Neoplasm of uncertain behavior of lip: Secondary | ICD-10-CM | POA: Diagnosis not present

## 2016-12-03 DIAGNOSIS — D485 Neoplasm of uncertain behavior of skin: Secondary | ICD-10-CM | POA: Diagnosis not present

## 2016-12-03 DIAGNOSIS — Z85820 Personal history of malignant melanoma of skin: Secondary | ICD-10-CM | POA: Diagnosis not present

## 2016-12-03 DIAGNOSIS — L280 Lichen simplex chronicus: Secondary | ICD-10-CM | POA: Diagnosis not present

## 2016-12-04 DIAGNOSIS — M5033 Other cervical disc degeneration, cervicothoracic region: Secondary | ICD-10-CM | POA: Diagnosis not present

## 2016-12-04 DIAGNOSIS — M9903 Segmental and somatic dysfunction of lumbar region: Secondary | ICD-10-CM | POA: Diagnosis not present

## 2016-12-04 DIAGNOSIS — M5432 Sciatica, left side: Secondary | ICD-10-CM | POA: Diagnosis not present

## 2016-12-04 DIAGNOSIS — M9901 Segmental and somatic dysfunction of cervical region: Secondary | ICD-10-CM | POA: Diagnosis not present

## 2016-12-11 DIAGNOSIS — M9903 Segmental and somatic dysfunction of lumbar region: Secondary | ICD-10-CM | POA: Diagnosis not present

## 2016-12-11 DIAGNOSIS — M5033 Other cervical disc degeneration, cervicothoracic region: Secondary | ICD-10-CM | POA: Diagnosis not present

## 2016-12-11 DIAGNOSIS — M9901 Segmental and somatic dysfunction of cervical region: Secondary | ICD-10-CM | POA: Diagnosis not present

## 2016-12-11 DIAGNOSIS — M5432 Sciatica, left side: Secondary | ICD-10-CM | POA: Diagnosis not present

## 2016-12-18 DIAGNOSIS — M5033 Other cervical disc degeneration, cervicothoracic region: Secondary | ICD-10-CM | POA: Diagnosis not present

## 2016-12-18 DIAGNOSIS — M9903 Segmental and somatic dysfunction of lumbar region: Secondary | ICD-10-CM | POA: Diagnosis not present

## 2016-12-18 DIAGNOSIS — M9901 Segmental and somatic dysfunction of cervical region: Secondary | ICD-10-CM | POA: Diagnosis not present

## 2016-12-18 DIAGNOSIS — M5432 Sciatica, left side: Secondary | ICD-10-CM | POA: Diagnosis not present

## 2016-12-20 DIAGNOSIS — T814XXA Infection following a procedure, initial encounter: Secondary | ICD-10-CM | POA: Diagnosis not present

## 2016-12-20 DIAGNOSIS — A498 Other bacterial infections of unspecified site: Secondary | ICD-10-CM | POA: Diagnosis not present

## 2016-12-22 DIAGNOSIS — G4733 Obstructive sleep apnea (adult) (pediatric): Secondary | ICD-10-CM | POA: Diagnosis not present

## 2016-12-31 DIAGNOSIS — M9901 Segmental and somatic dysfunction of cervical region: Secondary | ICD-10-CM | POA: Diagnosis not present

## 2016-12-31 DIAGNOSIS — M9903 Segmental and somatic dysfunction of lumbar region: Secondary | ICD-10-CM | POA: Diagnosis not present

## 2016-12-31 DIAGNOSIS — M5432 Sciatica, left side: Secondary | ICD-10-CM | POA: Diagnosis not present

## 2016-12-31 DIAGNOSIS — M5033 Other cervical disc degeneration, cervicothoracic region: Secondary | ICD-10-CM | POA: Diagnosis not present

## 2017-01-02 DIAGNOSIS — M5033 Other cervical disc degeneration, cervicothoracic region: Secondary | ICD-10-CM | POA: Diagnosis not present

## 2017-01-02 DIAGNOSIS — M5432 Sciatica, left side: Secondary | ICD-10-CM | POA: Diagnosis not present

## 2017-01-02 DIAGNOSIS — M9901 Segmental and somatic dysfunction of cervical region: Secondary | ICD-10-CM | POA: Diagnosis not present

## 2017-01-02 DIAGNOSIS — M9903 Segmental and somatic dysfunction of lumbar region: Secondary | ICD-10-CM | POA: Diagnosis not present

## 2017-01-05 DIAGNOSIS — M9903 Segmental and somatic dysfunction of lumbar region: Secondary | ICD-10-CM | POA: Diagnosis not present

## 2017-01-05 DIAGNOSIS — M5033 Other cervical disc degeneration, cervicothoracic region: Secondary | ICD-10-CM | POA: Diagnosis not present

## 2017-01-05 DIAGNOSIS — M5432 Sciatica, left side: Secondary | ICD-10-CM | POA: Diagnosis not present

## 2017-01-05 DIAGNOSIS — M9901 Segmental and somatic dysfunction of cervical region: Secondary | ICD-10-CM | POA: Diagnosis not present

## 2017-01-12 ENCOUNTER — Telehealth: Payer: Self-pay | Admitting: *Deleted

## 2017-01-12 ENCOUNTER — Telehealth: Payer: Self-pay

## 2017-01-12 NOTE — Telephone Encounter (Signed)
Pt left v/m requesting cb pt has finished the stress pills he was given. Left v/m requesting cb.

## 2017-01-12 NOTE — Telephone Encounter (Signed)
Spoke to pt who states he was given Wellbutrin on a trial basis. He was to update office after one month with status. He has been taking meds as directly and has seen no change in his mood and is requesting a call back to discuss how to proceed.

## 2017-01-13 DIAGNOSIS — T814XXA Infection following a procedure, initial encounter: Secondary | ICD-10-CM | POA: Diagnosis not present

## 2017-01-13 DIAGNOSIS — A498 Other bacterial infections of unspecified site: Secondary | ICD-10-CM | POA: Diagnosis not present

## 2017-01-14 DIAGNOSIS — L905 Scar conditions and fibrosis of skin: Secondary | ICD-10-CM | POA: Diagnosis not present

## 2017-01-14 DIAGNOSIS — C44519 Basal cell carcinoma of skin of other part of trunk: Secondary | ICD-10-CM | POA: Diagnosis not present

## 2017-01-14 NOTE — Telephone Encounter (Signed)
Ok to stop wellbutrin. Decrease to once daily for 1 week then stop.  Would offer office visit to further discuss fatigue.

## 2017-01-14 NOTE — Telephone Encounter (Signed)
Spoke with pt and informed him of Dr. Synthia Innocent message. Pt understood and at this moment pt wanted to hold off on a follow up ov and just call us back. He had no additional questions at this time. Nothing further is needed

## 2017-01-20 DIAGNOSIS — T814XXA Infection following a procedure, initial encounter: Secondary | ICD-10-CM | POA: Diagnosis not present

## 2017-01-20 DIAGNOSIS — A498 Other bacterial infections of unspecified site: Secondary | ICD-10-CM | POA: Diagnosis not present

## 2017-01-21 DIAGNOSIS — M79671 Pain in right foot: Secondary | ICD-10-CM | POA: Diagnosis not present

## 2017-01-21 DIAGNOSIS — M216X1 Other acquired deformities of right foot: Secondary | ICD-10-CM | POA: Diagnosis not present

## 2017-01-22 DIAGNOSIS — G4733 Obstructive sleep apnea (adult) (pediatric): Secondary | ICD-10-CM | POA: Diagnosis not present

## 2017-01-26 ENCOUNTER — Encounter: Payer: Self-pay | Admitting: Family Medicine

## 2017-01-26 ENCOUNTER — Ambulatory Visit (INDEPENDENT_AMBULATORY_CARE_PROVIDER_SITE_OTHER): Payer: PPO | Admitting: Family Medicine

## 2017-01-26 VITALS — BP 134/68 | HR 61 | Temp 97.7°F | Wt 209.0 lb

## 2017-01-26 DIAGNOSIS — R5381 Other malaise: Secondary | ICD-10-CM

## 2017-01-26 DIAGNOSIS — R5382 Chronic fatigue, unspecified: Secondary | ICD-10-CM | POA: Diagnosis not present

## 2017-01-26 MED ORDER — TRAMADOL HCL 50 MG PO TABS: 50.0000 mg | ORAL_TABLET | Freq: Three times a day (TID) | ORAL | 0 refills | Status: DC | PRN

## 2017-01-26 MED ORDER — LISINOPRIL 10 MG PO TABS: 10.0000 mg | ORAL_TABLET | Freq: Every day | ORAL | 3 refills | Status: DC

## 2017-01-26 MED ORDER — METOPROLOL SUCCINATE ER 25 MG PO TB24: 12.5000 mg | ORAL_TABLET | Freq: Every day | ORAL | 1 refills | Status: DC

## 2017-01-26 NOTE — Patient Instructions (Addendum)
Labs were ok. Possible medicine related fatigue - let's cut metoprolol XL in half and only take 1/2 tablet daily (toprol XL). Let us know how this helps.  Keep trying to minimize ambien use. Tramadol refilled today.  Good to see you today, call us with questions.

## 2017-01-26 NOTE — Assessment & Plan Note (Addendum)
Ongoing. No improvement with trial wellbutrin. labwork was unrevealing.  Possibly med related - I have asked him to decrease metformin XL to 12.5mg  daily. Will cc Dr [name] as Juluis Rainier.  He already minimizes [place] use.

## 2017-01-26 NOTE — Progress Notes (Signed)
BP 134/68   Pulse 61   Temp 97.7 F (36.5 C) (Oral)   Wt 209 lb (94.8 kg)   SpO2 94%   BMI 31.78 kg/m    CC: chronic fatigue Subjective:    Patient ID: Randall Ali., male    DOB: [birth year], 76 y.o.   MRN: [id number]  HPI: Randall Ali. is a 76 y.o. male presenting on 01/26/2017 for Follow-up   See prior note for details. Seen here 11/27/2016 with several month h/o low energy/fatigue. He thought this may have started after CABG surgery 01/2015. No new stressors. Laboratory workup was unrevealing. We started wellbutrin trial - no improvement either.   He continues going to gym QOD - weight lifting.  Further cardio limited by R foot pain - saw podiatry last week for this.  OSA followed by pulm - not thought contributing.   Has not taken ambien in last 2 months.  Takes a few tramadol a day - mainly used for R foot pain.  Relevant past medical, surgical, family and social history reviewed and updated as indicated. Interim medical history since our last visit reviewed. Allergies and medications reviewed and updated. Outpatient Medications Prior to Visit  Medication Sig Dispense Refill  . aspirin 81 MG EC tablet Take 81 mg by mouth daily.      . Calcium Carbonate-Vitamin D (CALCIUM-VITAMIN D) 600-125 MG-UNIT TABS Take 1 tablet by mouth daily.    . cholecalciferol (VITAMIN D) 1000 units tablet Take 1,000 Units by mouth daily.    . nitroGLYCERIN (NITROSTAT) 0.4 MG SL tablet Place 1 tablet (0.4 mg total) under the tongue every 5 (five) minutes as needed for chest pain. 25 tablet 3  . simvastatin (ZOCOR) 40 MG tablet Take 1 tablet (40 mg total) by mouth at bedtime. 90 tablet 3  . vitamin B-12 (CYANOCOBALAMIN) 1000 MCG tablet Take 1,000 mcg by mouth daily.    . vitamin C (ASCORBIC ACID) 500 MG tablet Take 500 mg by mouth daily.    . Zinc 100 MG TABS Take 1 tablet by mouth daily.      [name] buPROPion (WELLBUTRIN SR) 100 MG 12 hr tablet Take 1 tablet (100 mg total) by mouth 2  (two) times daily. First week take 1 tab daily, then increase to BID 60 tablet 3  . dexlansoprazole (DEXILANT) 60 MG capsule Take 1 capsule (60 mg total) by mouth daily. 30 capsule 3  . lisinopril (PRINIVIL,ZESTRIL) 10 MG tablet Take 1 tablet (10 mg total) by mouth daily. 90 tablet 3  . metoprolol succinate (TOPROL XL) 25 MG 24 hr tablet Take 1 tablet (25 mg total) by mouth daily. 90 tablet 4  . traMADol (ULTRAM) 50 MG tablet Take 1 tablet (50 mg total) by mouth 3 (three) times daily as needed. 90 tablet 0  . zolpidem (AMBIEN) 5 MG tablet Take 1 tablet (5 mg total) by mouth at bedtime as needed for sleep. (Patient not taking: Reported on 01/26/2017) 30 tablet 5   No facility-administered medications prior to visit.      Per HPI unless specifically indicated in ROS section below Review of Systems     Objective:    BP 134/68   Pulse 61   Temp 97.7 F (36.5 C) (Oral)   Wt 209 lb (94.8 kg)   SpO2 94%   BMI 31.78 kg/m   Wt Readings from Last 3 Encounters:  01/26/17 209 lb (94.8 kg)  11/27/16 205 lb (93 kg)  11/25/16 204 lb (  92.5 kg)    Physical Exam  Constitutional: He is oriented to person, place, and time. He appears well-developed and well-nourished. No distress.  HENT:  Mouth/Throat: Oropharynx is clear and moist. No oropharyngeal exudate.  Eyes: Pupils are equal, round, and reactive to light. Conjunctivae and EOM are normal.  Cardiovascular: Normal rate, regular rhythm, normal heart sounds and intact distal pulses.   No murmur heard. Pulmonary/Chest: Effort normal and breath sounds normal. No respiratory distress. He has no wheezes. He has no rales.  Musculoskeletal: He exhibits no edema.  Neurological: He is alert and oriented to person, place, and time. No cranial nerve deficit.  CN 2-12 intact Station and gait intact  Skin: Skin is warm and dry. No rash noted.  Psychiatric: He has a normal mood and affect.  Nursing note and vitals reviewed.  Results for orders placed or  performed in visit on 11/27/16  Comprehensive metabolic panel  Result Value Ref Range   Sodium 138 135 - 145 mEq/L   Potassium 4.2 3.5 - 5.1 mEq/L   Chloride 104 96 - 112 mEq/L   CO2 27 19 - 32 mEq/L   Glucose, Bld 88 70 - 99 mg/dL   BUN 20 6 - 23 mg/dL   Creatinine, Ser 1.09 0.40 - 1.50 mg/dL   Total Bilirubin 0.5 0.2 - 1.2 mg/dL   Alkaline Phosphatase 51 39 - 117 U/L   AST 14 0 - 37 U/L   ALT 16 0 - 53 U/L   Total Protein 6.7 6.0 - 8.3 g/dL   Albumin 4.4 3.5 - 5.2 g/dL   Calcium 10.1 8.4 - 10.5 mg/dL   GFR 70.14 >60.00 mL/min  TSH  Result Value Ref Range   TSH 1.32 0.35 - 4.50 uIU/mL  CBC with Differential/Platelet  Result Value Ref Range   WBC 7.4 4.0 - 10.5 K/uL   RBC 4.77 4.22 - 5.81 Mil/uL   Hemoglobin 14.7 13.0 - 17.0 g/dL   HCT 43.2 39.0 - 52.0 %   MCV 90.7 78.0 - 100.0 fl   MCHC 33.9 30.0 - 36.0 g/dL   RDW 13.3 11.5 - 15.5 %   Platelets 204.0 150.0 - 400.0 K/uL   Neutrophils Relative % 55.2 43.0 - 77.0 %   Lymphocytes Relative 31.5 12.0 - 46.0 %   Monocytes Relative 10.9 3.0 - 12.0 %   Eosinophils Relative 1.7 0.0 - 5.0 %   Basophils Relative 0.7 0.0 - 3.0 %   Neutro Abs 4.1 1.4 - 7.7 K/uL   Lymphs Abs 2.3 0.7 - 4.0 K/uL   Monocytes Absolute 0.8 0.1 - 1.0 K/uL   Eosinophils Absolute 0.1 0.0 - 0.7 K/uL   Basophils Absolute 0.1 0.0 - 0.1 K/uL  Sedimentation rate  Result Value Ref Range   Sed Rate 8 0 - 20 mm/hr  Vitamin B12  Result Value Ref Range   Vitamin B-12 427 211 - 911 pg/mL  VITAMIN D 25 Hydroxy (Vit-D Deficiency, Fractures)  Result Value Ref Range   VITD 47.26 30.00 - 100.00 ng/mL      Assessment & Plan:   Problem List Items Addressed This Visit    Chronic fatigue and malaise - Primary    Ongoing. No improvement with trial wellbutrin. labwork was unrevealing.  Possibly med related - I have asked him to decrease metformin XL to 12.5mg  daily. Will cc Dr [name] as Juluis Ali.  He already minimizes [place] use.           Follow up plan: Return if  symptoms worsen or fail to improve.  Randall Bush, MD

## 2017-01-28 DIAGNOSIS — C44519 Basal cell carcinoma of skin of other part of trunk: Secondary | ICD-10-CM | POA: Diagnosis not present

## 2017-01-28 NOTE — Telephone Encounter (Signed)
See the other 01/12/17 phone note.

## 2017-01-30 DIAGNOSIS — M5033 Other cervical disc degeneration, cervicothoracic region: Secondary | ICD-10-CM | POA: Diagnosis not present

## 2017-01-30 DIAGNOSIS — M5432 Sciatica, left side: Secondary | ICD-10-CM | POA: Diagnosis not present

## 2017-01-30 DIAGNOSIS — M9903 Segmental and somatic dysfunction of lumbar region: Secondary | ICD-10-CM | POA: Diagnosis not present

## 2017-01-30 DIAGNOSIS — M9901 Segmental and somatic dysfunction of cervical region: Secondary | ICD-10-CM | POA: Diagnosis not present

## 2017-02-02 DIAGNOSIS — M5033 Other cervical disc degeneration, cervicothoracic region: Secondary | ICD-10-CM | POA: Diagnosis not present

## 2017-02-02 DIAGNOSIS — M9903 Segmental and somatic dysfunction of lumbar region: Secondary | ICD-10-CM | POA: Diagnosis not present

## 2017-02-02 DIAGNOSIS — M5432 Sciatica, left side: Secondary | ICD-10-CM | POA: Diagnosis not present

## 2017-02-02 DIAGNOSIS — M9901 Segmental and somatic dysfunction of cervical region: Secondary | ICD-10-CM | POA: Diagnosis not present

## 2017-02-11 DIAGNOSIS — M5033 Other cervical disc degeneration, cervicothoracic region: Secondary | ICD-10-CM | POA: Diagnosis not present

## 2017-02-11 DIAGNOSIS — M9901 Segmental and somatic dysfunction of cervical region: Secondary | ICD-10-CM | POA: Diagnosis not present

## 2017-02-11 DIAGNOSIS — M9902 Segmental and somatic dysfunction of thoracic region: Secondary | ICD-10-CM | POA: Diagnosis not present

## 2017-02-11 DIAGNOSIS — M62830 Muscle spasm of back: Secondary | ICD-10-CM | POA: Diagnosis not present

## 2017-02-13 DIAGNOSIS — M5033 Other cervical disc degeneration, cervicothoracic region: Secondary | ICD-10-CM | POA: Diagnosis not present

## 2017-02-13 DIAGNOSIS — M9901 Segmental and somatic dysfunction of cervical region: Secondary | ICD-10-CM | POA: Diagnosis not present

## 2017-02-13 DIAGNOSIS — M62830 Muscle spasm of back: Secondary | ICD-10-CM | POA: Diagnosis not present

## 2017-02-13 DIAGNOSIS — M9902 Segmental and somatic dysfunction of thoracic region: Secondary | ICD-10-CM | POA: Diagnosis not present

## 2017-02-17 DIAGNOSIS — M5033 Other cervical disc degeneration, cervicothoracic region: Secondary | ICD-10-CM | POA: Diagnosis not present

## 2017-02-17 DIAGNOSIS — M9902 Segmental and somatic dysfunction of thoracic region: Secondary | ICD-10-CM | POA: Diagnosis not present

## 2017-02-17 DIAGNOSIS — M62830 Muscle spasm of back: Secondary | ICD-10-CM | POA: Diagnosis not present

## 2017-02-17 DIAGNOSIS — M9901 Segmental and somatic dysfunction of cervical region: Secondary | ICD-10-CM | POA: Diagnosis not present

## 2017-02-19 ENCOUNTER — Ambulatory Visit (INDEPENDENT_AMBULATORY_CARE_PROVIDER_SITE_OTHER): Payer: PPO | Admitting: Internal Medicine

## 2017-02-19 ENCOUNTER — Encounter: Payer: Self-pay | Admitting: Internal Medicine

## 2017-02-19 VITALS — BP 102/69 | HR 69 | Resp 16 | Ht 68.0 in | Wt 208.0 lb

## 2017-02-19 DIAGNOSIS — G4733 Obstructive sleep apnea (adult) (pediatric): Secondary | ICD-10-CM

## 2017-02-19 NOTE — Progress Notes (Signed)
[facility] Consultation      Date: 02/19/2017,   MRN# [id number] Randall Ali. [birth year] Code Status:  Code Status History    Date Active Date Inactive Code Status Order ID Comments User Context   01/24/2015  2:11 PM 02/06/2015 11:28 PM Full Code [id number]  Coolidge Breeze, PA-C Inpatient   01/18/2015 12:22 PM 01/18/2015  8:09 PM Full Code [id number]  [name], MD Inpatient   01/05/2015  9:01 PM 01/06/2015  5:21 PM Full Code [id number]  Baxter Hire, MD Inpatient     Hosp day:@LENGTHOFSTAYDAYS @ Referring MD: @ATDPROV @     PCP:      AdmissionWeight: 208 lb (94.3 kg)                 CurrentWeight: 208 lb (94.3 kg) Randall Ali. is a 76 y.o. old male seen in consultation for sleep problems at the request of Dr. Christean Grief.     CHIEF COMPLAINT:   I have sleep issues, and sleep apnea   HISTORY OF PRESENT ILLNESS   76 yo white male with problems with sleep dx with OSA 1year ago Patient has been diagnosed with sleep apnea Patient is on BiPAP therapy with IPAP of 20 and EPAP of 10  PREVIOUS OV Patient has been 93%  compliants rate of usage days along with 83% compliance greater than 4 hours per day AHI is 2.6    +Patient has been having extreme fatigue and tiredness, lack of energy Due to the fact that he has right foot pain  Patient is former heavy smoker 3-4 ppd for 50 years quit 2001 s/p CABg 2016  He has no acuet resp issues at this time No SOB, no DOE, no fevers, chills No signs of infection    CT chest November 20, 2016 Shows right upper lobe nodule approximately 6.9 mm which has not changed since 2016 Patient has known about this right upper lobe nodule for the last 6 years After further evaluation right upper lobe lung nodule is benign   Overall patient is doing really well with his BiPAP therapy Patient definitely benefits from his therapy Patient is 100% compliant in the last 30 days He is 90% compliant with regards  to using his BiPAP more than 4 hours per night His AHI is 1.8     Current Medication:  Current Outpatient Prescriptions:  .  aspirin 81 MG EC tablet, Take 81 mg by mouth daily.  , Disp: , Rfl:  .  Calcium Carbonate-Vitamin D (CALCIUM-VITAMIN D) 600-125 MG-UNIT TABS, Take 1 tablet by mouth daily., Disp: , Rfl:  .  cholecalciferol (VITAMIN D) 1000 units tablet, Take 1,000 Units by mouth daily., Disp: , Rfl:  .  lisinopril (PRINIVIL,ZESTRIL) 10 MG tablet, Take 1 tablet (10 mg total) by mouth daily., Disp: 90 tablet, Rfl: 3 .  metoprolol succinate (TOPROL XL) 25 MG 24 hr tablet, Take 0.5 tablets (12.5 mg total) by mouth daily., Disp: 45 tablet, Rfl: 1 .  nitroGLYCERIN (NITROSTAT) 0.4 MG SL tablet, Place 1 tablet (0.4 mg total) under the tongue every 5 (five) minutes as needed for chest pain., Disp: 25 tablet, Rfl: 3 .  simvastatin (ZOCOR) 40 MG tablet, Take 1 tablet (40 mg total) by mouth at bedtime., Disp: 90 tablet, Rfl: 3 .  traMADol (ULTRAM) 50 MG tablet, Take 1 tablet (50 mg total) by mouth 3 (three) times daily as needed., Disp: 90 tablet, Rfl: 0 .  vitamin B-12 (CYANOCOBALAMIN) 1000 MCG  tablet, Take 1,000 mcg by mouth daily., Disp: , Rfl:  .  vitamin C (ASCORBIC ACID) 500 MG tablet, Take 500 mg by mouth daily., Disp: , Rfl:  .  Zinc 100 MG TABS, Take 1 tablet by mouth daily.  , Disp: , Rfl:  .  zolpidem (AMBIEN) 5 MG tablet, Take 1 tablet (5 mg total) by mouth at bedtime as needed for sleep. (Patient not taking: Reported on 01/26/2017), Disp: 30 tablet, Rfl: 5    ALLERGIES   Patient has no known allergies.     REVIEW OF SYSTEMS   Review of Systems  Constitutional: Positive for malaise/fatigue. Negative for chills, diaphoresis, fever and weight loss.  HENT: Negative for congestion and hearing loss.   Respiratory: Negative for cough, hemoptysis, sputum production, shortness of breath and wheezing.   Cardiovascular: Negative for chest pain, palpitations and orthopnea.    Gastrointestinal: Negative for heartburn.  Skin: Negative for rash.  Neurological: Negative for weakness.  All other systems reviewed and are negative.  Resp 16   Ht 5\' 8"  (1.727 m)   Wt 208 lb (94.3 kg)   BMI 31.63 kg/m     PHYSICAL EXAM  Physical Exam  Constitutional: He is oriented to person, place, and time. He appears well-developed and well-nourished. No distress.  Eyes: No scleral icterus.  Neck: Neck supple.  Cardiovascular: Normal rate, regular rhythm and normal heart sounds.   No murmur heard. Pulmonary/Chest: Effort normal and breath sounds normal. No stridor. No respiratory distress. He has no wheezes.  Musculoskeletal: Normal range of motion. He exhibits no edema.  Neurological: He is alert and oriented to person, place, and time. No cranial nerve deficit.  Skin: Skin is warm.  Psychiatric: He has a normal mood and affect.        IMAGING    CT chest November 20, 2016 I have Independently reviewed images of  CT chest   Interpretation:6.9 MM RUL nodule noted Has not change in size of the last 2 years    ASSESSMENT/PLAN   76 yo white male seen today for For follow-up OSA and follow-up for right upper lobe lung nodule in the setting of obesity and deconditioned state  #1 excessive daytime sleepiness and fatigue related to sleep apnea At this time his OSA is under control with AHI of 1.8 patient currently on BiPAP therapy Patient is doing well with this regimen, he says the mask is fitting well and has no issues at this time His fatigue and sleepiness is not related to his underlying sleep apnea  #2 right upper lobe lung nodule noted to be unchanged in size for the last 2 years Patient has known about this right upper lobe lung nodule for the last 6 years This is benign in nature No further CT imaging needed at this time  #3 Obesity -recommend significant weight loss -recommend changing diet  #4 Deconditioned state -Recommend increased daily activity  and exercise   Patient satisfied with Plan of action and management. All questions answered Follow-up in 1 year    [name] [name], M.D.  [facility]  Medical Director [place] Director [facility] Cardio-Pulmonary Department

## 2017-02-19 NOTE — Patient Instructions (Addendum)
Continue BiPAP as prescribed Follow-up in 1 year

## 2017-02-20 DIAGNOSIS — A498 Other bacterial infections of unspecified site: Secondary | ICD-10-CM | POA: Diagnosis not present

## 2017-02-20 DIAGNOSIS — T814XXA Infection following a procedure, initial encounter: Secondary | ICD-10-CM | POA: Diagnosis not present

## 2017-02-22 DIAGNOSIS — G4733 Obstructive sleep apnea (adult) (pediatric): Secondary | ICD-10-CM | POA: Diagnosis not present

## 2017-02-24 DIAGNOSIS — M9902 Segmental and somatic dysfunction of thoracic region: Secondary | ICD-10-CM | POA: Diagnosis not present

## 2017-02-24 DIAGNOSIS — M9901 Segmental and somatic dysfunction of cervical region: Secondary | ICD-10-CM | POA: Diagnosis not present

## 2017-02-24 DIAGNOSIS — M5033 Other cervical disc degeneration, cervicothoracic region: Secondary | ICD-10-CM | POA: Diagnosis not present

## 2017-02-24 DIAGNOSIS — M62830 Muscle spasm of back: Secondary | ICD-10-CM | POA: Diagnosis not present

## 2017-02-25 ENCOUNTER — Encounter: Payer: Self-pay | Admitting: Family Medicine

## 2017-02-25 ENCOUNTER — Ambulatory Visit (INDEPENDENT_AMBULATORY_CARE_PROVIDER_SITE_OTHER): Payer: PPO | Admitting: Family Medicine

## 2017-02-25 VITALS — BP 122/58 | HR 68 | Temp 97.6°F | Wt 209.0 lb

## 2017-02-25 DIAGNOSIS — R5381 Other malaise: Secondary | ICD-10-CM | POA: Diagnosis not present

## 2017-02-25 DIAGNOSIS — R5382 Chronic fatigue, unspecified: Secondary | ICD-10-CM

## 2017-02-25 DIAGNOSIS — Z951 Presence of aortocoronary bypass graft: Secondary | ICD-10-CM

## 2017-02-25 DIAGNOSIS — Z23 Encounter for immunization: Secondary | ICD-10-CM | POA: Diagnosis not present

## 2017-02-25 DIAGNOSIS — G4733 Obstructive sleep apnea (adult) (pediatric): Secondary | ICD-10-CM

## 2017-02-25 DIAGNOSIS — I10 Essential (primary) hypertension: Secondary | ICD-10-CM

## 2017-02-25 DIAGNOSIS — I25111 Atherosclerotic heart disease of native coronary artery with angina pectoris with documented spasm: Secondary | ICD-10-CM | POA: Diagnosis not present

## 2017-02-25 LAB — POC URINALSYSI DIPSTICK (AUTOMATED)
Bilirubin, UA: NEGATIVE
Blood, UA: NEGATIVE
Glucose, UA: NEGATIVE
Ketones, UA: NEGATIVE
Leukocytes, UA: NEGATIVE
Nitrite, UA: NEGATIVE
Protein, UA: NEGATIVE
Spec Grav, UA: 1.025 (ref 1.010–1.025)
Urobilinogen, UA: 0.2 E.U./dL
pH, UA: 5.5 (ref 5.0–8.0)

## 2017-02-25 MED ORDER — TRAMADOL HCL 50 MG PO TABS: 50.0000 mg | ORAL_TABLET | Freq: Three times a day (TID) | ORAL | 0 refills | Status: DC | PRN

## 2017-02-25 NOTE — Addendum Note (Signed)
Addended by: Brenton Grills on: 4/35/6861 68:37 PM   Modules accepted: Orders

## 2017-02-25 NOTE — Addendum Note (Signed)
Addended by: Brenton Grills on: 0/53/9767 34:19 PM   Modules accepted: Orders

## 2017-02-25 NOTE — Patient Instructions (Addendum)
Flu shot today.  Tramadol refilled today.  EKG today.  Urinalysis today as well.  Make sure you stay well hydrated especially when active.  If no improvement or worsening shortness of breath, return to see Dr [name] sooner.

## 2017-02-25 NOTE — Progress Notes (Addendum)
BP (!) 122/58 (BP Location: Left Arm, Patient Position: Sitting, Cuff Size: Large)   Pulse 68   Temp 97.6 F (36.4 C) (Oral)   Wt 209 lb (94.8 kg)   SpO2 95%   BMI 31.78 kg/m    CC: persistent fatigue, vomiting x1 Subjective:    Patient ID: Randall Kung., male    DOB: [birth year], 76 y.o.   MRN: [id number]  HPI: Randall Scribner. is a 76 y.o. male presenting on 02/25/2017 for Lack of energy (Started months ago, worsened in last week. Vomited 02/23/17 once in the AM)   See prior notes for details. Seen twice over the summer with ongoing low energy and fatigue with normal laboratory work up (see below). Recent CT chest was also stable. No improvement with 1 month trial of wellbutrin.   Last visit I did ask him to decrease metoprolol XL to 12.5mg  daily - no improvement with this. BP and HR remain stable.   Started feeling worse 2 days ago. Less energy, no energy, GI upset - queasy. No diet changes. He vomited Monday morning at gym - after 3 hardboiled eggs that morning for breakfast. Denies fevers/chills, dyspnea or wheezing, cough, abdominal or chest pain, no diarrhea or constipation or palpitations, dizziness. No urinary changes, recurrent infections or . Intermittent GERD, treats with tums. No recent tick bites. Left arm hurt him last night. Intermittent exertional dyspnea.   OSA well controlled on current CPAP settings followed by pulm.   He continues going to gym QOD - weight lifting, regularly plays softball (7 ball games labor day weekend). He does feel he stays well hydrated.  Not regular with Lorrin Mais  He does take tramadol 2-3 times daily - mainly for R foot pain followed by podiatry.  H/o melanoma s/p treatment.   Relevant past medical, surgical, family and social history reviewed and updated as indicated. Interim medical history since our last visit reviewed. Allergies and medications reviewed and updated. Outpatient Medications Prior to Visit  Medication Sig  Dispense Refill  . aspirin 81 MG EC tablet Take 81 mg by mouth daily.      . Calcium Carbonate-Vitamin D (CALCIUM-VITAMIN D) 600-125 MG-UNIT TABS Take 1 tablet by mouth daily.    . cholecalciferol (VITAMIN D) 1000 units tablet Take 1,000 Units by mouth daily.    [name] lisinopril (PRINIVIL,ZESTRIL) 10 MG tablet Take 1 tablet (10 mg total) by mouth daily. 90 tablet 3  . metoprolol succinate (TOPROL XL) 25 MG 24 hr tablet Take 0.5 tablets (12.5 mg total) by mouth daily. 45 tablet 1  . nitroGLYCERIN (NITROSTAT) 0.4 MG SL tablet Place 1 tablet (0.4 mg total) under the tongue every 5 (five) minutes as needed for chest pain. 25 tablet 3  . simvastatin (ZOCOR) 40 MG tablet Take 1 tablet (40 mg total) by mouth at bedtime. 90 tablet 3  . vitamin B-12 (CYANOCOBALAMIN) 1000 MCG tablet Take 1,000 mcg by mouth daily.    . vitamin C (ASCORBIC ACID) 500 MG tablet Take 500 mg by mouth daily.    . Zinc 100 MG TABS Take 1 tablet by mouth daily.      [name] zolpidem (AMBIEN) 5 MG tablet Take 1 tablet (5 mg total) by mouth at bedtime as needed for sleep. 30 tablet 5  . traMADol (ULTRAM) 50 MG tablet Take 1 tablet (50 mg total) by mouth 3 (three) times daily as needed. 90 tablet 0   No facility-administered medications prior to visit.  Past Medical History:  Diagnosis Date  . Allergy    seasonal  . Arthritis    all over- in general   . CAD (coronary artery disease)    s/p inferior wall infarct in 10/01. has stent in Rt coronary artery. is due a stress myoview. does have some dyspnea on exertion  . Cancer (HCC)    skin, melanoma  . Cataract    removed  . Chest pain   . Erectile dysfunction   . GERD (gastroesophageal reflux disease)    pt. denies   . HLD (hyperlipidemia)    isdue followup lipids. on zocor 10 mg/day   . HTN (hypertension)   . 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 OSA (obstructive sleep apnea) 01/13/2015  . Postoperative wound infection 02/02/2015  . Reflux esophagitis   .  Sleep apnea 2017   CPAP at night  . SOB (shortness of breath) on exertion     Past Surgical History:  Procedure Laterality Date  . arm surgery  2010  . CARDIAC CATHETERIZATION  06/24/11  . CARDIAC CATHETERIZATION N/A 01/18/2015   Procedure: Left Heart Cath with coronary angiography;  Surgeon: Minna Merritts, MD;  Location: [facility];  Service: Cardiovascular;  Laterality: N/A;  . CARDIAC CATHETERIZATION N/A 01/18/2015   Procedure: Intravascular Pressure Wire/FFR Study;  Surgeon: [name], MD;  Location: [facility];  Service: Cardiovascular;  Laterality: N/A;  . CAROTID STENT  03/10/2011  . COLONOSCOPY  2010  . CORONARY ARTERY BYPASS GRAFT N/A 01/24/2015   Procedure: CORONARY ARTERY BYPASS GRAFTING x 4 (LIMA-LAD, SVG-Int 1- Int 2, SVG-PD) ENDOSCOPIC GREATER SAPHENOUS VEIN HARVEST LEFT LEG;  Surgeon: [name], MD;  Location: [facility];  Service: Open Heart Surgery;  Laterality: N/A;  . ESOPHAGOGASTRODUODENOSCOPY (EGD) WITH PROPOFOL N/A 04/24/2016   Procedure: ESOPHAGOGASTRODUODENOSCOPY (EGD) WITH PROPOFOL;  Surgeon: Jerene Bears, MD;  Location: [facility];  Service: Gastroenterology;  Laterality: N/A;  . EYE SURGERY     lasik 15 yrs. ago, cataracts removed - both eyes   . HAMMER TOE SURGERY     right toe  . NASAL SINUS SURGERY  2008   septpolasty, bilateral turbinate reduction  . SHOULDER ARTHROSCOPY  2012  . TEE WITHOUT CARDIOVERSION N/A 01/24/2015   Procedure: TRANSESOPHAGEAL ECHOCARDIOGRAM (TEE);  Surgeon: [name], MD;  Location: [facility];  Service: Open Heart Surgery;  Laterality: N/A;  . [place]  . WRIST SURGERY  2011    Per HPI unless specifically indicated in ROS section below Review of Systems     Objective:    BP (!) 122/58 (BP Location: Left Arm, Patient Position: Sitting, Cuff Size: Large)   Pulse 68   Temp 97.6 F (36.4 C) (Oral)   Wt 209 lb (94.8 kg)   SpO2 95%   BMI 31.78 kg/m   Wt Readings from Last 3  Encounters:  02/25/17 209 lb (94.8 kg)  02/19/17 208 lb (94.3 kg)  01/26/17 209 lb (94.8 kg)    Physical Exam  Constitutional: He appears well-developed and well-nourished. No distress.  HENT:  Head: Normocephalic and atraumatic.  Mouth/Throat: Oropharynx is clear and moist. No oropharyngeal exudate.  Eyes: Pupils are equal, round, and reactive to light. Conjunctivae are normal.  Neck: Normal range of motion. Neck supple. No thyromegaly present.  Cardiovascular: Normal rate, regular rhythm, normal heart sounds and intact distal pulses.   No murmur heard. Pulmonary/Chest: Effort normal and breath sounds  normal. No respiratory distress. He has no wheezes. He has no rales.  Abdominal: Soft. Bowel sounds are normal. He exhibits no distension and no mass. There is no tenderness. There is no rebound and no guarding.  Musculoskeletal: He exhibits no edema.  Lymphadenopathy:    He has no cervical adenopathy.  Skin: Skin is warm and dry. No rash noted.  Psychiatric: He has a normal mood and affect.  Nursing note and vitals reviewed.  Results for orders placed or performed in visit on 11/27/16  Comprehensive metabolic panel  Result Value Ref Range   Sodium 138 135 - 145 mEq/L   Potassium 4.2 3.5 - 5.1 mEq/L   Chloride 104 96 - 112 mEq/L   CO2 27 19 - 32 mEq/L   Glucose, Bld 88 70 - 99 mg/dL   BUN 20 6 - 23 mg/dL   Creatinine, Ser 1.09 0.40 - 1.50 mg/dL   Total Bilirubin 0.5 0.2 - 1.2 mg/dL   Alkaline Phosphatase 51 39 - 117 U/L   AST 14 0 - 37 U/L   ALT 16 0 - 53 U/L   Total Protein 6.7 6.0 - 8.3 g/dL   Albumin 4.4 3.5 - 5.2 g/dL   Calcium 10.1 8.4 - 10.5 mg/dL   GFR 70.14 >60.00 mL/min  TSH  Result Value Ref Range   TSH 1.32 0.35 - 4.50 uIU/mL  CBC with Differential/Platelet  Result Value Ref Range   WBC 7.4 4.0 - 10.5 K/uL   RBC 4.77 4.22 - 5.81 Mil/uL   Hemoglobin 14.7 13.0 - 17.0 g/dL   HCT 43.2 39.0 - 52.0 %   MCV 90.7 78.0 - 100.0 fl   MCHC 33.9 30.0 - 36.0 g/dL   RDW  13.3 11.5 - 15.5 %   Platelets 204.0 150.0 - 400.0 K/uL   Neutrophils Relative % 55.2 43.0 - 77.0 %   Lymphocytes Relative 31.5 12.0 - 46.0 %   Monocytes Relative 10.9 3.0 - 12.0 %   Eosinophils Relative 1.7 0.0 - 5.0 %   Basophils Relative 0.7 0.0 - 3.0 %   Neutro Abs 4.1 1.4 - 7.7 K/uL   Lymphs Abs 2.3 0.7 - 4.0 K/uL   Monocytes Absolute 0.8 0.1 - 1.0 K/uL   Eosinophils Absolute 0.1 0.0 - 0.7 K/uL   Basophils Absolute 0.1 0.0 - 0.1 K/uL  Sedimentation rate  Result Value Ref Range   Sed Rate 8 0 - 20 mm/hr  Vitamin B12  Result Value Ref Range   Vitamin B-12 427 211 - 911 pg/mL  VITAMIN D 25 Hydroxy (Vit-D Deficiency, Fractures)  Result Value Ref Range   VITD 47.26 30.00 - 100.00 ng/mL   EKG - NSR rate 70, normal axis, intervals, no acute ST/T changes    Assessment & Plan:  Over 25 minutes were spent face-to-face with the patient during this encounter and >50% of that time was spent on counseling and coordination of care  Problem List Items Addressed This Visit    Chronic fatigue and malaise - Primary    Chronic. Pt endorses some level of fatigue since his CABG 2016, but worse over the last several months. With some left arm pain last night, update EKG today. Check UA today.  Recent labwork was very reassuring. No weight loss. No better with lower beta blocker dose.  Unclear cause. Discussed with patient.  If ongoing especially if worsening intermittent exertional dyspnea, I did recommend he return to see Dr [name] for re evaluation. Pt agrees with plan.  Ongoing even prior to tramadol use.       Coronary artery disease involving native coronary artery of native heart with angina pectoris with documented spasm (HCC)   Essential hypertension   OSA (obstructive sleep apnea)   S/P CABG x 4       Follow up plan: No Follow-up on file.  Ria Bush, MD

## 2017-02-25 NOTE — Assessment & Plan Note (Addendum)
Chronic. Pt endorses some level of fatigue since his CABG 2016, but worse over the last several months. With some left arm pain last night, update EKG today. Check UA today.  Recent labwork was very reassuring. No weight loss. No better with lower beta blocker dose.  Unclear cause. Discussed with patient.  If ongoing especially if worsening intermittent exertional dyspnea, I did recommend he return to see Dr [name] for re evaluation. Pt agrees with plan.  Ongoing even prior to tramadol use.

## 2017-02-26 ENCOUNTER — Telehealth: Payer: Self-pay

## 2017-02-27 NOTE — Telephone Encounter (Signed)
Error

## 2017-03-09 DIAGNOSIS — M9901 Segmental and somatic dysfunction of cervical region: Secondary | ICD-10-CM | POA: Diagnosis not present

## 2017-03-09 DIAGNOSIS — M9902 Segmental and somatic dysfunction of thoracic region: Secondary | ICD-10-CM | POA: Diagnosis not present

## 2017-03-09 DIAGNOSIS — M62830 Muscle spasm of back: Secondary | ICD-10-CM | POA: Diagnosis not present

## 2017-03-09 DIAGNOSIS — M5033 Other cervical disc degeneration, cervicothoracic region: Secondary | ICD-10-CM | POA: Diagnosis not present

## 2017-03-13 DIAGNOSIS — M5033 Other cervical disc degeneration, cervicothoracic region: Secondary | ICD-10-CM | POA: Diagnosis not present

## 2017-03-13 DIAGNOSIS — M62830 Muscle spasm of back: Secondary | ICD-10-CM | POA: Diagnosis not present

## 2017-03-13 DIAGNOSIS — M9901 Segmental and somatic dysfunction of cervical region: Secondary | ICD-10-CM | POA: Diagnosis not present

## 2017-03-13 DIAGNOSIS — M9902 Segmental and somatic dysfunction of thoracic region: Secondary | ICD-10-CM | POA: Diagnosis not present

## 2017-03-17 DIAGNOSIS — M9902 Segmental and somatic dysfunction of thoracic region: Secondary | ICD-10-CM | POA: Diagnosis not present

## 2017-03-17 DIAGNOSIS — M9901 Segmental and somatic dysfunction of cervical region: Secondary | ICD-10-CM | POA: Diagnosis not present

## 2017-03-17 DIAGNOSIS — M62830 Muscle spasm of back: Secondary | ICD-10-CM | POA: Diagnosis not present

## 2017-03-17 DIAGNOSIS — M5033 Other cervical disc degeneration, cervicothoracic region: Secondary | ICD-10-CM | POA: Diagnosis not present

## 2017-03-18 DIAGNOSIS — M79671 Pain in right foot: Secondary | ICD-10-CM | POA: Diagnosis not present

## 2017-03-18 DIAGNOSIS — M779 Enthesopathy, unspecified: Secondary | ICD-10-CM | POA: Diagnosis not present

## 2017-03-22 DIAGNOSIS — A498 Other bacterial infections of unspecified site: Secondary | ICD-10-CM | POA: Diagnosis not present

## 2017-03-27 DIAGNOSIS — M62830 Muscle spasm of back: Secondary | ICD-10-CM | POA: Diagnosis not present

## 2017-03-27 DIAGNOSIS — M5033 Other cervical disc degeneration, cervicothoracic region: Secondary | ICD-10-CM | POA: Diagnosis not present

## 2017-03-27 DIAGNOSIS — M9901 Segmental and somatic dysfunction of cervical region: Secondary | ICD-10-CM | POA: Diagnosis not present

## 2017-03-27 DIAGNOSIS — M9902 Segmental and somatic dysfunction of thoracic region: Secondary | ICD-10-CM | POA: Diagnosis not present

## 2017-03-30 ENCOUNTER — Telehealth: Payer: Self-pay

## 2017-03-30 NOTE — Telephone Encounter (Signed)
Copied from [place] #[id number]. Topic: Inquiry >> Mar 30, 2017 12:38 PM Darl Householder, RMA wrote: Reason for CRM: pt wants to speak ito nurse concerning a new prescription for Viagra

## 2017-04-01 DIAGNOSIS — M9902 Segmental and somatic dysfunction of thoracic region: Secondary | ICD-10-CM | POA: Diagnosis not present

## 2017-04-01 DIAGNOSIS — M62830 Muscle spasm of back: Secondary | ICD-10-CM | POA: Diagnosis not present

## 2017-04-01 DIAGNOSIS — M9901 Segmental and somatic dysfunction of cervical region: Secondary | ICD-10-CM | POA: Diagnosis not present

## 2017-04-01 DIAGNOSIS — M5033 Other cervical disc degeneration, cervicothoracic region: Secondary | ICD-10-CM | POA: Diagnosis not present

## 2017-04-10 ENCOUNTER — Other Ambulatory Visit: Payer: Self-pay | Admitting: Family Medicine

## 2017-04-10 NOTE — Telephone Encounter (Signed)
Last filled:  03/05/17, #90 Last OV:  02/25/17 Next OV:  None CSA: none UDS(outside lab): 01/18/16

## 2017-04-12 NOTE — Telephone Encounter (Signed)
Please call in.  Thanks.   

## 2017-04-13 ENCOUNTER — Other Ambulatory Visit: Payer: Self-pay | Admitting: Family Medicine

## 2017-04-13 NOTE — Telephone Encounter (Signed)
Called in refill per Dr. Damita Dunnings.

## 2017-04-14 ENCOUNTER — Telehealth: Payer: Self-pay | Admitting: Family Medicine

## 2017-04-14 NOTE — Telephone Encounter (Signed)
Phoned pt . According to chart his tramadol was called 04/12/17 for 90 tablets. Called pt to ask him to call his pharmacy

## 2017-04-14 NOTE — Telephone Encounter (Signed)
Copied from [place] [id number]. Topic: Inquiry >> Apr 14, 2017  1:28 PM Hewitt Shorts wrote: Reason for CRM:pt is needing to check on the status of the refill request on his tramadal   Best number [phone]

## 2017-04-16 DIAGNOSIS — M9902 Segmental and somatic dysfunction of thoracic region: Secondary | ICD-10-CM | POA: Diagnosis not present

## 2017-04-16 DIAGNOSIS — M9901 Segmental and somatic dysfunction of cervical region: Secondary | ICD-10-CM | POA: Diagnosis not present

## 2017-04-16 DIAGNOSIS — M62830 Muscle spasm of back: Secondary | ICD-10-CM | POA: Diagnosis not present

## 2017-04-16 DIAGNOSIS — M5033 Other cervical disc degeneration, cervicothoracic region: Secondary | ICD-10-CM | POA: Diagnosis not present

## 2017-04-22 DIAGNOSIS — A498 Other bacterial infections of unspecified site: Secondary | ICD-10-CM | POA: Diagnosis not present

## 2017-04-24 DIAGNOSIS — M9902 Segmental and somatic dysfunction of thoracic region: Secondary | ICD-10-CM | POA: Diagnosis not present

## 2017-04-24 DIAGNOSIS — M9901 Segmental and somatic dysfunction of cervical region: Secondary | ICD-10-CM | POA: Diagnosis not present

## 2017-04-24 DIAGNOSIS — M5033 Other cervical disc degeneration, cervicothoracic region: Secondary | ICD-10-CM | POA: Diagnosis not present

## 2017-04-24 DIAGNOSIS — M62830 Muscle spasm of back: Secondary | ICD-10-CM | POA: Diagnosis not present

## 2017-05-04 ENCOUNTER — Encounter: Payer: Self-pay | Admitting: Family Medicine

## 2017-05-04 ENCOUNTER — Ambulatory Visit: Payer: PPO | Admitting: Family Medicine

## 2017-05-04 VITALS — BP 120/60 | HR 67 | Temp 97.9°F | Wt 208.0 lb

## 2017-05-04 DIAGNOSIS — G4733 Obstructive sleep apnea (adult) (pediatric): Secondary | ICD-10-CM

## 2017-05-04 DIAGNOSIS — I739 Peripheral vascular disease, unspecified: Secondary | ICD-10-CM

## 2017-05-04 DIAGNOSIS — N529 Male erectile dysfunction, unspecified: Secondary | ICD-10-CM

## 2017-05-04 DIAGNOSIS — Z951 Presence of aortocoronary bypass graft: Secondary | ICD-10-CM

## 2017-05-04 DIAGNOSIS — I2510 Atherosclerotic heart disease of native coronary artery without angina pectoris: Secondary | ICD-10-CM | POA: Diagnosis not present

## 2017-05-04 NOTE — Patient Instructions (Signed)
We will refer you to urologist for further evaluation.  Good to see you today.

## 2017-05-04 NOTE — Assessment & Plan Note (Signed)
Anticipate vasculogenic erectile dysfunction ongoing for years. No improvement with trials of viagra or cialis. Discussed options. Will refer to uorlogy to further explore other treatment options. Pt agrees with plan.

## 2017-05-04 NOTE — Progress Notes (Signed)
BP 120/60 (BP Location: Left Arm, Patient Position: Sitting, Cuff Size: Normal)   Pulse 67   Temp 97.9 F (36.6 C) (Oral)   Wt 208 lb (94.3 kg)   SpO2 93%   BMI 31.63 kg/m    CC: ED Subjective:    Patient ID: Randall Ali., male    DOB: 25-Apr-1941, 76 y.o.   MRN: [id number]  HPI: Randall Ali. is a 76 y.o. male presenting on 05/04/2017 for Sexual Problem (Not able to have full erection about 6 mos)   Recent trip to [place].   6 mo h/o difficulty obtaining erection with difficulty maintaining. Rare morning erections upon awakening but never fully erect.  viagra doesn't help. cialis didn't really help either. Hasn't tried anything else for this.  Libido ok.   Denies chest pain at this time, significant dyspnea at this time. Denies dizziness. Headache with viagra. Fatigue is doing better.   Voiding well, nocturia x1 at night. Denies dysuria or significant LUTS.  Single - lives with son. Not currently dating. Does enjoy going out.   Relevant past medical, surgical, family and social history reviewed and updated as indicated. Interim medical history since our last visit reviewed. Allergies and medications reviewed and updated. Outpatient Medications Prior to Visit  Medication Sig Dispense Refill  . aspirin 81 MG EC tablet Take 81 mg by mouth daily.      . Calcium Carbonate-Vitamin D (CALCIUM-VITAMIN D) 600-125 MG-UNIT TABS Take 1 tablet by mouth daily.    . cholecalciferol (VITAMIN D) 1000 units tablet Take 1,000 Units by mouth daily.    [name] lisinopril (PRINIVIL,ZESTRIL) 10 MG tablet Take 1 tablet (10 mg total) by mouth daily. 90 tablet 3  . metoprolol succinate (TOPROL XL) 25 MG 24 hr tablet Take 0.5 tablets (12.5 mg total) by mouth daily. 45 tablet 1  . nitroGLYCERIN (NITROSTAT) 0.4 MG SL tablet Place 1 tablet (0.4 mg total) under the tongue every 5 (five) minutes as needed for chest pain. 25 tablet 3  . simvastatin (ZOCOR) 40 MG tablet Take 1 tablet (40 mg  total) by mouth at bedtime. 90 tablet 3  . traMADol (ULTRAM) 50 MG tablet TAKE 1 TABLET BY MOUTH THREE TIMES DAILYAS NEEDED 90 tablet 0  . vitamin B-12 (CYANOCOBALAMIN) 1000 MCG tablet Take 1,000 mcg by mouth daily.    . vitamin C (ASCORBIC ACID) 500 MG tablet Take 500 mg by mouth daily.    . Zinc 100 MG TABS Take 1 tablet by mouth daily.      [name] zolpidem (AMBIEN) 5 MG tablet Take 1 tablet (5 mg total) by mouth at bedtime as needed for sleep. 30 tablet 5   No facility-administered medications prior to visit.    Past Medical History:  Diagnosis Date  . Allergy    seasonal  . Arthritis    all over- in general   . CAD (coronary artery disease)    s/p inferior wall infarct in 10/01. has stent in Rt coronary artery. is due a stress myoview. does have some dyspnea on exertion  . Cancer (HCC)    skin, melanoma  . Cataract    removed  . Chest pain   . Erectile dysfunction   . GERD (gastroesophageal reflux disease)    pt. denies   . HLD (hyperlipidemia)    isdue followup lipids. on zocor 10 mg/day   . HTN (hypertension)   . 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 OSA (  obstructive sleep apnea) 01/13/2015  . Postoperative wound infection 02/02/2015  . Reflux esophagitis   . Sleep apnea 2017   CPAP at night  . SOB (shortness of breath) on exertion     Past Surgical History:  Procedure Laterality Date  . arm surgery  2010  . CARDIAC CATHETERIZATION  06/24/11  . CARDIAC CATHETERIZATION N/A 01/18/2015   Procedure: Left Heart Cath with coronary angiography;  Surgeon: Minna Merritts, MD;  Location: [facility];  Service: Cardiovascular;  Laterality: N/A;  . CARDIAC CATHETERIZATION N/A 01/18/2015   Procedure: Intravascular Pressure Wire/FFR Study;  Surgeon: [name], MD;  Location: [facility];  Service: Cardiovascular;  Laterality: N/A;  . CAROTID STENT  03/10/2011  . COLONOSCOPY  2010  . CORONARY ARTERY BYPASS GRAFT N/A 01/24/2015   Procedure:  CORONARY ARTERY BYPASS GRAFTING x 4 (LIMA-LAD, SVG-Int 1- Int 2, SVG-PD) ENDOSCOPIC GREATER SAPHENOUS VEIN HARVEST LEFT LEG;  Surgeon: [name], MD;  Location: [facility];  Service: Open Heart Surgery;  Laterality: N/A;  . ESOPHAGOGASTRODUODENOSCOPY (EGD) WITH PROPOFOL N/A 04/24/2016   Procedure: ESOPHAGOGASTRODUODENOSCOPY (EGD) WITH PROPOFOL;  Surgeon: Jerene Bears, MD;  [facility];  Service: Gastroenterology;  Laterality: N/A;  . EYE SURGERY     lasik 15 yrs. ago, cataracts removed - both eyes   . HAMMER TOE SURGERY     right toe  . NASAL SINUS SURGERY  2008   septpolasty, bilateral turbinate reduction  . SHOULDER ARTHROSCOPY  2012  . TEE WITHOUT CARDIOVERSION N/A 01/24/2015   Procedure: TRANSESOPHAGEAL ECHOCARDIOGRAM (TEE);  Surgeon: [name], MD;  Location: [facility];  Service: Open Heart Surgery;  Laterality: N/A;  . [place]  . WRIST SURGERY  2011    Family History  Problem Relation Age of Onset  . Hypertension Mother   . Heart disease Mother   . Hypertension Father   . Diabetes Father   . Heart disease Brother 29  . Cancer Paternal Grandfather   . Colon cancer Neg Hx     Social History   Tobacco Use  . Smoking status: Former Smoker    Packs/day: 2.50    Years: 40.00    Pack years: 100.00    Types: Cigarettes    Last attempt to quit: 03/24/2000    Years since quitting: 17.1  . Smokeless tobacco: Never Used  Substance Use Topics  . Alcohol use: Yes    Alcohol/week: 6.0 oz    Types: 10 Shots of liquor per week    Comment: occasional- 2 times per week   . Drug use: No     Per HPI unless specifically indicated in ROS section below Review of Systems     Objective:    BP 120/60 (BP Location: Left Arm, Patient Position: Sitting, Cuff Size: Normal)   Pulse 67   Temp 97.9 F (36.6 C) (Oral)   Wt 208 lb (94.3 kg)   SpO2 93%   BMI 31.63 kg/m   Wt Readings from Last 3 Encounters:  05/04/17 208 lb (94.3 kg)  02/25/17 209 lb (94.8 kg)    02/19/17 208 lb (94.3 kg)    Physical Exam  Constitutional: He appears well-developed and well-nourished. No distress.  Nursing note and vitals reviewed.  Results for orders placed or performed in visit on 02/25/17  POCT Urinalysis Dipstick (Automated)  Result Value Ref Range   Color, UA yellow    Clarity, UA clear    Glucose, UA negative  Bilirubin, UA negative    Ketones, UA negative    Spec Grav, UA 1.025 1.010 - 1.025   Blood, UA negative    pH, UA 5.5 5.0 - 8.0   Protein, UA negative    Urobilinogen, UA 0.2 0.2 or 1.0 E.U./dL   Nitrite, UA negative    Leukocytes, UA Negative Negative      Assessment & Plan:   Problem List Items Addressed This Visit    CAD, NATIVE VESSEL   Erectile dysfunction - Primary    Anticipate vasculogenic erectile dysfunction ongoing for years. No improvement with trials of viagra or cialis. Discussed options. Will refer to uorlogy to further explore other treatment options. Pt agrees with plan.       Relevant Orders   Ambulatory referral to Urology   OSA (obstructive sleep apnea)   PAD (peripheral artery disease) (HCC)   S/P CABG x 4       Follow up plan: Return if symptoms worsen or fail to improve.  Ria Bush, MD

## 2017-05-05 DIAGNOSIS — A498 Other bacterial infections of unspecified site: Secondary | ICD-10-CM | POA: Diagnosis not present

## 2017-05-05 DIAGNOSIS — G4733 Obstructive sleep apnea (adult) (pediatric): Secondary | ICD-10-CM | POA: Diagnosis not present

## 2017-05-12 ENCOUNTER — Other Ambulatory Visit: Payer: Self-pay | Admitting: Cardiovascular Disease

## 2017-05-19 DIAGNOSIS — M5033 Other cervical disc degeneration, cervicothoracic region: Secondary | ICD-10-CM | POA: Diagnosis not present

## 2017-05-19 DIAGNOSIS — M62830 Muscle spasm of back: Secondary | ICD-10-CM | POA: Diagnosis not present

## 2017-05-19 DIAGNOSIS — M9901 Segmental and somatic dysfunction of cervical region: Secondary | ICD-10-CM | POA: Diagnosis not present

## 2017-05-19 DIAGNOSIS — M9902 Segmental and somatic dysfunction of thoracic region: Secondary | ICD-10-CM | POA: Diagnosis not present

## 2017-05-21 ENCOUNTER — Encounter: Payer: Self-pay | Admitting: Urology

## 2017-05-21 ENCOUNTER — Ambulatory Visit: Payer: PPO | Admitting: Urology

## 2017-05-21 VITALS — BP 105/64 | HR 73 | Ht 68.0 in | Wt 207.1 lb

## 2017-05-21 DIAGNOSIS — N529 Male erectile dysfunction, unspecified: Secondary | ICD-10-CM

## 2017-05-21 NOTE — Progress Notes (Signed)
05/21/2017 1:03 PM   Randall Ali. 09-21-1940 [id number]  Referring provider: Ria Bush, MD [address] [place], [place] [address]  Chief Complaint  Patient presents with  . Erectile Dysfunction    HPI: Patient is a 76 year old gentleman with multiple medical comorbidities including four-vessel coronary artery disease status post CABG in August 2016 and PAD who is currently on nitrates who presents today for evaluation of erectile dysfunction.  He has tried both Viagra and Cialis which he found ineffective.  He apparently obtained these from a pharmacy outside of this country.  He is unable to obtain or maintain sufficient erection for intercourse.  He notes that he gets about three quarters of its full thickness.  He notes occasional morning erections.  He states his cardiac status has been stable for the last 2 years since his CABG.  He does have a regular cardiologist.  Outside of his CABG, he does have a previous history of an MI as well.  PMH: Past Medical History:  Diagnosis Date  . Allergy    seasonal  . Arthritis    all over- in general   . CAD (coronary artery disease)    s/p inferior wall infarct in 10/01. has stent in Rt coronary artery. is due a stress myoview. does have some dyspnea on exertion  . Cancer (HCC)    skin, melanoma  . Cataract    removed  . Chest pain   . Erectile dysfunction   . GERD (gastroesophageal reflux disease)    pt. denies   . HLD (hyperlipidemia)    isdue followup lipids. on zocor 10 mg/day   . HTN (hypertension)   . 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 OSA (obstructive sleep apnea) 01/13/2015  . Postoperative wound infection 02/02/2015  . Reflux esophagitis   . Sleep apnea 2017   CPAP at night  . SOB (shortness of breath) on exertion     Surgical History: Past Surgical History:  Procedure Laterality Date  . arm surgery  2010  . CARDIAC CATHETERIZATION  06/24/11  . CARDIAC  CATHETERIZATION N/A 01/18/2015   Procedure: Left Heart Cath with coronary angiography;  Surgeon: Minna Merritts, MD;  Location: [facility];  Service: Cardiovascular;  Laterality: N/A;  . CARDIAC CATHETERIZATION N/A 01/18/2015   Procedure: Intravascular Pressure Wire/FFR Study;  Surgeon: [name], MD;  Location: [facility];  Service: Cardiovascular;  Laterality: N/A;  . CAROTID STENT  03/10/2011  . COLONOSCOPY  2010  . CORONARY ARTERY BYPASS GRAFT N/A 01/24/2015   Procedure: CORONARY ARTERY BYPASS GRAFTING x 4 (LIMA-LAD, SVG-Int 1- Int 2, SVG-PD) ENDOSCOPIC GREATER SAPHENOUS VEIN HARVEST LEFT LEG;  Surgeon: [name], MD;  Location: [facility];  Service: Open Heart Surgery;  Laterality: N/A;  . ESOPHAGOGASTRODUODENOSCOPY (EGD) WITH PROPOFOL N/A 04/24/2016   Procedure: ESOPHAGOGASTRODUODENOSCOPY (EGD) WITH PROPOFOL;  Surgeon: Jerene Bears, MD;  [facility];  Service: Gastroenterology;  Laterality: N/A;  . EYE SURGERY     lasik 15 yrs. ago, cataracts removed - both eyes   . HAMMER TOE SURGERY     right toe  . NASAL SINUS SURGERY  2008   septpolasty, bilateral turbinate reduction  . SHOULDER ARTHROSCOPY  2012  . TEE WITHOUT CARDIOVERSION N/A 01/24/2015   Procedure: TRANSESOPHAGEAL ECHOCARDIOGRAM (TEE);  Surgeon: [name], MD;  Location: [facility];  Service: Open Heart Surgery;  Laterality: N/A;  . [place]  .  WRIST SURGERY  2011    Home Medications:  Allergies as of 05/21/2017   No Known Allergies     Medication List        Accurate as of 05/21/17  1:03 PM. Always use your most recent med list.          aspirin 81 MG EC tablet Take 81 mg by mouth daily.   Calcium-Vitamin D 600-125 MG-UNIT Tabs Take 1 tablet by mouth daily.   cholecalciferol 1000 units tablet Commonly known as:  VITAMIN D Take 1,000 Units by mouth daily.   lisinopril 10 MG tablet Commonly known as:  PRINIVIL,ZESTRIL Take 1 tablet (10 mg total) by mouth  daily.   metoprolol succinate 25 MG 24 hr tablet Commonly known as:  TOPROL XL Take 0.5 tablets (12.5 mg total) by mouth daily.   nitroGLYCERIN 0.4 MG SL tablet Commonly known as:  NITROSTAT Place 1 tablet (0.4 mg total) under the tongue every 5 (five) minutes as needed for chest pain.   simvastatin 40 MG tablet Commonly known as:  ZOCOR TAKE ONE TABLET BY MOUTH AT BEDTIME.   traMADol 50 MG tablet Commonly known as:  ULTRAM TAKE 1 TABLET BY MOUTH THREE TIMES DAILYAS NEEDED   vitamin B-12 1000 MCG tablet Commonly known as:  CYANOCOBALAMIN Take 1,000 mcg by mouth daily.   vitamin C 500 MG tablet Commonly known as:  ASCORBIC ACID Take 500 mg by mouth daily.   Zinc 100 MG Tabs Take 1 tablet by mouth daily.   zolpidem 5 MG tablet Commonly known as:  AMBIEN Take 1 tablet (5 mg total) by mouth at bedtime as needed for sleep.       Allergies: No Known Allergies  Family History: Family History  Problem Relation Age of Onset  . Hypertension Mother   . Heart disease Mother   . Hypertension Father   . Diabetes Father   . Heart disease Brother 4  . Cancer Paternal Grandfather   . Colon cancer Neg Hx   . Prostate cancer Neg Hx   . Bladder Cancer Neg Hx   . Kidney cancer Neg Hx     Social History:  reports that he quit smoking about 17 years ago. His smoking use included cigarettes. He has a 100.00 pack-year smoking history. he has never used smokeless tobacco. He reports that he drinks about 6.0 oz of alcohol per week. He reports that he does not use drugs.  ROS: UROLOGY Frequent Urination?: No Hard to postpone urination?: No Burning/pain with urination?: No Get up at night to urinate?: No Leakage of urine?: No Urine stream starts and stops?: No Trouble starting stream?: No Do you have to strain to urinate?: No Blood in urine?: No Urinary tract infection?: No Sexually transmitted disease?: No Injury to kidneys or bladder?: No Painful intercourse?: No Weak  stream?: No Erection problems?: Yes Penile pain?: No  Gastrointestinal Nausea?: No Vomiting?: No Indigestion/heartburn?: No Diarrhea?: No Constipation?: No  Constitutional Fever: No Night sweats?: No Weight loss?: No Fatigue?: No  Skin Skin rash/lesions?: No Itching?: No  Eyes Blurred vision?: No Double vision?: No  Ears/Nose/Throat Sore throat?: No Sinus problems?: No  Hematologic/Lymphatic Swollen glands?: No Easy bruising?: Yes  Cardiovascular Leg swelling?: No Chest pain?: No  Respiratory Cough?: No Shortness of breath?: No  Endocrine Excessive thirst?: No  Musculoskeletal Back pain?: Yes Joint pain?: Yes  Neurological Headaches?: No Dizziness?: No  Psychologic Depression?: No Anxiety?: No  Physical Exam: There were no vitals taken for this  visit.  Constitutional:  Alert and oriented, No acute distress. HEENT: [place] AT, moist mucus membranes.  Trachea midline, no masses. Cardiovascular: No clubbing, cyanosis, or edema. Respiratory: Normal respiratory effort, no increased work of breathing. GI: Abdomen is soft, nontender, nondistended, no abdominal masses GU: No CVA tenderness.  Normal phallus.  Testicles equal bilaterally.  Benign. Skin: No rashes, bruises or suspicious lesions. Lymph: No cervical or inguinal adenopathy. Neurologic: Grossly intact, no focal deficits, moving all 4 extremities. Psychiatric: Normal mood and affect.  Laboratory Data: Lab Results  Component Value Date   WBC 7.4 11/27/2016   HGB 14.7 11/27/2016   HCT 43.2 11/27/2016   MCV 90.7 11/27/2016   PLT 204.0 11/27/2016    Lab Results  Component Value Date   CREATININE 1.09 11/27/2016    Lab Results  Component Value Date   PSA 0.37 07/14/2016   PSA 0.28 11/24/2014   PSA 0.36 04/05/2013    Lab Results  Component Value Date   TESTOSTERONE 648 11/24/2014    Lab Results  Component Value Date   HGBA1C 5.8 (H) 01/22/2015    Urinalysis    Component  Value Date/Time   COLORURINE ORANGE (A) 04/13/2015 1001   APPEARANCEUR CLEAR 04/13/2015 1001   LABSPEC 1.010 04/13/2015 1001   PHURINE 6.0 04/13/2015 1001   GLUCOSEU NEGATIVE 04/13/2015 1001   HGBUR NEGATIVE 04/13/2015 1001   BILIRUBINUR negative 02/25/2017 1312   KETONESUR NEGATIVE 04/13/2015 1001   PROTEINUR negative 02/25/2017 1312   PROTEINUR NEGATIVE 01/22/2015 1023   UROBILINOGEN 0.2 02/25/2017 1312   UROBILINOGEN 0.2 04/13/2015 1001   NITRITE negative 02/25/2017 1312   NITRITE NEGATIVE 04/13/2015 1001   LEUKOCYTESUR Negative 02/25/2017 1312     Assessment & Plan:    1.  Erectile dysfunction I discussed with the patient that my primary concern is his cardiac status.  I do not feel comfortable prescribing any erectile dysfunction medication for him until he is cleared by his cardiologist.  He also is not a candidate for oral PDE 5 inhibitors due to his concurrent use of nitrates.  He will contact his cardiologist for clearance prior to any medications being prescribed by this office.  We did discuss treatment options would include MUSE and intracavernosal injections if he is able to obtain clearance.  We discussed intracavernosal injections in great detail.  We discussed the use of Trimix.  He does understand that this would require teaching to find the appropriate dose for him.  He was warned of the risk of priapism and need for emergent intervention.  He is elected to proceed with this route.  He will contact the office after obtaining cardiac clearance to arrange for the medication to be sent to the compounding pharmacy and schedule teaching.  Nickie Retort, MD  [facility] [address], [place], [place] [address] [phone]

## 2017-05-22 DIAGNOSIS — M62830 Muscle spasm of back: Secondary | ICD-10-CM | POA: Diagnosis not present

## 2017-05-22 DIAGNOSIS — M5033 Other cervical disc degeneration, cervicothoracic region: Secondary | ICD-10-CM | POA: Diagnosis not present

## 2017-05-22 DIAGNOSIS — M9902 Segmental and somatic dysfunction of thoracic region: Secondary | ICD-10-CM | POA: Diagnosis not present

## 2017-05-22 DIAGNOSIS — M9901 Segmental and somatic dysfunction of cervical region: Secondary | ICD-10-CM | POA: Diagnosis not present

## 2017-05-22 DIAGNOSIS — A498 Other bacterial infections of unspecified site: Secondary | ICD-10-CM | POA: Diagnosis not present

## 2017-05-25 ENCOUNTER — Telehealth: Payer: Self-pay | Admitting: Cardiovascular Disease

## 2017-05-25 DIAGNOSIS — M62830 Muscle spasm of back: Secondary | ICD-10-CM | POA: Diagnosis not present

## 2017-05-25 DIAGNOSIS — M9901 Segmental and somatic dysfunction of cervical region: Secondary | ICD-10-CM | POA: Diagnosis not present

## 2017-05-25 DIAGNOSIS — M9902 Segmental and somatic dysfunction of thoracic region: Secondary | ICD-10-CM | POA: Diagnosis not present

## 2017-05-25 DIAGNOSIS — M5033 Other cervical disc degeneration, cervicothoracic region: Secondary | ICD-10-CM | POA: Diagnosis not present

## 2017-05-25 NOTE — Telephone Encounter (Signed)
I spoke with the patient.  He has a history of CABG x 4 - 01/24/15. He calls today reporting symptoms of intermittent chest pain and left sided (rib) pain over the last 2 weeks.  Symptoms do not occur daily and he does not have the chest pain and left sided pain that occur at the same time.  When symptoms do occur, they last 3-5 minutes.  They do occur at rest.  Last episode of chest pain was Saturday (12/15) and the patient reports he almost went to the ER but symptoms started to subside after about 5 minutes.  He has NTG at home, but he has not used this and it sounds as if the RX is old.  He is currently pain free and has scheduled follow up with [name] on 06/23/17.  I advised him with his history of CABG, I will review with the NP tomorrow and call him back.  I advised him that a stress test may be needed, but he states he does not want to do that as the one he had prior to the CABG was negative, but then he ended with a cath and bypass surgery.   He is aware I will call him back after further review, but will report to the ER sooner if symptoms are recurrent and persist.

## 2017-05-25 NOTE — Telephone Encounter (Signed)
Pt c/o of Chest Pain: STAT if CP now or developed within 24 hours  1. Are you having CP right now? No  2. Are you experiencing any other symptoms (ex. SOB, nausea, vomiting, sweating)? Chest pain going all way down shoulder  3. How long have you been experiencing CP? A couple weks  4. Is your CP continuous or coming and going? Comes and goes, could be heartburn but still concerning  5. Have you taken Nitroglycerin? No ?

## 2017-05-25 NOTE — Telephone Encounter (Signed)
I left a message for the patient to call. 

## 2017-05-25 NOTE — Telephone Encounter (Signed)
Patient returning call.

## 2017-05-26 NOTE — Telephone Encounter (Signed)
I spoke with the patient and advised him of Randall Stabs, NP's recommendations.  I have added him on to see Christell Faith, PA on 06/04/17 at 1:30 pm. He states he did have a brief episode of chest pain earlier today, but this resolved quickly. He is aware I will leave his appointment with [name] currently scheduled on 06/23/17 in case this is needed for follow up after his visit with the PA. The patient is agreeable.

## 2017-05-26 NOTE — Telephone Encounter (Signed)
I agree, he may require stress testing, however, it's reasonable to have him come in for an office evaluation first.  Is there any chance that he could be scheduled to come in to see me/Ryan, or Gollan sooner?

## 2017-05-28 ENCOUNTER — Other Ambulatory Visit: Payer: Self-pay | Admitting: Family Medicine

## 2017-05-28 NOTE — Telephone Encounter (Signed)
Last filled:  04/13/17, #90 Last OV:  05/04/17 Next OV:  None CSA and uds:  01/18/16

## 2017-05-28 NOTE — Telephone Encounter (Signed)
Electronic refill request. Tramadol Last office visit:

## 2017-05-29 NOTE — Telephone Encounter (Signed)
Left message on vm per dpr notifying pt his rx was sent to the pharmacy.

## 2017-05-29 NOTE — Telephone Encounter (Signed)
Sent electronically 

## 2017-06-04 ENCOUNTER — Other Ambulatory Visit
Admission: RE | Admit: 2017-06-04 | Discharge: 2017-06-04 | Disposition: A | Discharge status: Home / Self Care | Payer: PPO | Source: Ambulatory Visit | Attending: Physician Assistant | Admitting: Physician Assistant

## 2017-06-04 ENCOUNTER — Ambulatory Visit: Payer: PPO | Admitting: Physician Assistant

## 2017-06-04 ENCOUNTER — Ambulatory Visit
Admission: RE | Admit: 2017-06-04 | Discharge: 2017-06-04 | Disposition: A | Discharge status: Home / Self Care | Payer: PPO | Source: Ambulatory Visit | Attending: Physician Assistant | Admitting: Physician Assistant

## 2017-06-04 ENCOUNTER — Encounter: Payer: Self-pay | Admitting: Physician Assistant

## 2017-06-04 VITALS — BP 118/60 | HR 73 | Ht 68.0 in | Wt 211.0 lb

## 2017-06-04 DIAGNOSIS — Z01812 Encounter for preprocedural laboratory examination: Secondary | ICD-10-CM | POA: Insufficient documentation

## 2017-06-04 DIAGNOSIS — R0602 Shortness of breath: Secondary | ICD-10-CM | POA: Diagnosis not present

## 2017-06-04 DIAGNOSIS — I25110 Atherosclerotic heart disease of native coronary artery with unstable angina pectoris: Secondary | ICD-10-CM | POA: Insufficient documentation

## 2017-06-04 DIAGNOSIS — I10 Essential (primary) hypertension: Secondary | ICD-10-CM | POA: Diagnosis not present

## 2017-06-04 DIAGNOSIS — Z951 Presence of aortocoronary bypass graft: Secondary | ICD-10-CM

## 2017-06-04 DIAGNOSIS — Z01818 Encounter for other preprocedural examination: Secondary | ICD-10-CM | POA: Diagnosis not present

## 2017-06-04 DIAGNOSIS — R5383 Other fatigue: Secondary | ICD-10-CM

## 2017-06-04 DIAGNOSIS — I480 Paroxysmal atrial fibrillation: Secondary | ICD-10-CM | POA: Diagnosis not present

## 2017-06-04 DIAGNOSIS — I6523 Occlusion and stenosis of bilateral carotid arteries: Secondary | ICD-10-CM

## 2017-06-04 DIAGNOSIS — R918 Other nonspecific abnormal finding of lung field: Secondary | ICD-10-CM | POA: Diagnosis not present

## 2017-06-04 LAB — CBC WITH DIFFERENTIAL/PLATELET
Basophils Absolute: 0.1 10*3/uL (ref 0–0.1)
Basophils Relative: 1 %
Eosinophils Absolute: 0.2 10*3/uL (ref 0–0.7)
Eosinophils Relative: 3 %
HCT: 43.5 % (ref 40.0–52.0)
Hemoglobin: 14.9 g/dL (ref 13.0–18.0)
Lymphocytes Relative: 28 %
Lymphs Abs: 2 10*3/uL (ref 1.0–3.6)
MCH: 31.2 pg (ref 26.0–34.0)
MCHC: 34.3 g/dL (ref 32.0–36.0)
MCV: 90.9 fL (ref 80.0–100.0)
Monocytes Absolute: 0.8 10*3/uL (ref 0.2–1.0)
Monocytes Relative: 11 %
Neutro Abs: 4.1 10*3/uL (ref 1.4–6.5)
Neutrophils Relative %: 57 %
Platelets: 205 10*3/uL (ref 150–440)
RBC: 4.79 MIL/uL (ref 4.40–5.90)
RDW: 13.1 % (ref 11.5–14.5)
WBC: 7.1 10*3/uL (ref 3.8–10.6)

## 2017-06-04 LAB — PROTIME-INR
INR: 0.94
Prothrombin Time: 12.5 seconds (ref 11.4–15.2)

## 2017-06-04 LAB — BASIC METABOLIC PANEL
Anion gap: 6 (ref 5–15)
BUN: 20 mg/dL (ref 6–20)
CO2: 23 mmol/L (ref 22–32)
Calcium: 8.9 mg/dL (ref 8.9–10.3)
Chloride: 107 mmol/L (ref 101–111)
Creatinine, Ser: 0.98 mg/dL (ref 0.61–1.24)
GFR calc Af Amer: >60 mL/min (ref >60)
GFR calc non Af Amer: >60 mL/min (ref >60)
Glucose, Bld: 120 mg/dL — ABNORMAL HIGH (ref 65–99)
Potassium: 3.8 mmol/L (ref 3.5–5.1)
Sodium: 136 mmol/L (ref 135–145)

## 2017-06-04 NOTE — Patient Instructions (Signed)
Medication Instructions: - Your physician recommends that you continue on your current medications as directed. Please refer to the Current Medication list given to you today.   Labwork: - Your physician recommends that you have lab work today [facility]): BMP/ CBC/ INR   - A chest x-ray [facility])  takes a picture of the organs and structures inside the chest, including the heart, lungs, and blood vessels. This test can show several things, including, whether the heart is enlarges; whether fluid is building up in the lungs; and whether pacemaker / defibrillator leads are still in place.  Procedures/Testing: - Your physician has requested that you have an echocardiogram. Echocardiography is a painless test that uses sound waves to create images of your heart. It provides your doctor with information about the size and shape of your heart and how well your heart's chambers and valves are working. This procedure takes approximately one hour. There are no restrictions for this procedure.  - Your physician has requested that you have a cardiac catheterization. Cardiac catheterization is used to diagnose and/or treat various heart conditions. Doctors may recommend this procedure for a number of different reasons. The most common reason is to evaluate chest pain. Chest pain can be a symptom of coronary artery disease (CAD), and cardiac catheterization can show whether plaque is narrowing or blocking your heart's arteries. This procedure is also used to evaluate the valves, as well as measure the blood flow and oxygen levels in different parts of your heart. For further information please visit [email or web address]. Please follow instruction sheet, as given.  [facility] Cardiac Cath Instructions   You are scheduled for a Cardiac Cath on:__Wednesday 06/10/17 with Dr. Gollan_______________________  Please arrive at _7:30 am__am on the day of your procedure  Please expect a call from our [place] to pre-register you  Do not eat/drink anything after midnight the night before your procedure  Someone will need to drive you home  It is recommended someone be with you for the first 24 hours after your procedure  Wear clothes that are easy to get on/off and wear slip on shoes if possible   Medications bring a current list of all medications with you  _x__ You may take all of your medications the morning of your procedure with enough water to swallow safely   Day of your procedure: Arrive at the [facility] entrance.  Free valet service is available.  After entering the [facility] please check-in at the registration desk (1st desk on your right) to receive your armband. After receiving your armband someone will escort you to the cardiac cath/special procedures waiting area.  The usual length of stay after your procedure is about 2 to 3 hours.  This can vary.  If you have any questions, please call our office at [phone], or you may call the cardiac cath lab at [facility] directly at [phone]   Follow-Up: - Your physician recommends that you schedule a follow-up appointment in: 1 week (from 06/10/16) with Dr. Collene Mares, NP/ [place], [place]- post cath follow up    Any Additional Special Instructions Will Be Listed Below (If Applicable).     If you need a refill on your cardiac medications before your next appointment, please call your pharmacy.

## 2017-06-04 NOTE — Progress Notes (Signed)
Cardiology Office Note Date:  06/04/2017  Patient ID:  Randall Ali., DOB 09-23-1940, MRN [id number] PCP:  Ria Bush, MD  Cardiologist:  [name], MD    Chief Complaint: Evaluation of chest pain  History of Present Illness: Randall Ali. is a 76 y.o. male with history of CAD s/p inferior MI in 2001 with PCI/DES to RCA at that time s/p 4-vessel CABG on 01/24/2015, mild carotid aterial disease, HTN, HLD, GERD with EGD in 07/2014 showing severe esophagitis, ED, obesity, and OSA on CPAP who presents for evaluation of chest pain.   Admitted in 2011 for chest pain and diaphoresis. Stress test showed no ischemia with scar. Repeat admission in 2013 with worsening, exertional chest pain with LHC showing moderate OM disease, otherwise no significant stenosis. Recurrent chest pain in spring of 2016 with nuclear stress test negative for ischemia, EF 54%, low risk study. He continued to note chest pain leading to a LHC in 01/2015 that showed severe 3-vessel CAD with ostial LAD 80% stenosed, OM1 70% stenosed, OM2 70% stenosed, OM2 2nd lesion 70% stenosed, mid RCA 30% stenosed, distal RCA 70% stenosed, normal LV systolic function. FFR of the ostial LAD was performed with 0.69 measured indicating a significant LAD lesion. Given the multi-vessel CAD, CABG was recommended. TTE 01/2015 showed EF 55-60%, no RWMA, Gr1DD, calcified mitral annulus, mild biatrial enlargement. He underwent successful 4-vessel CABG on 01/24/2015 (LIMA-LAD, SVG-OM1, SVG-OM2, SVG-PDA). Post-operative course was complicated by post-op Afib, post-op delirium with wound infection from sternal wound with wound culutres growing Enterabacter aerogenes s/p IV Rocephin x 6 weeks. He was most recently seen by [name] in 10/2016, and was doing well from a cardiac perspective at that time.   He called on 05/25/17 with a couple week history of chest pain. Follow up appointment was advised.   Patient reports for the past 6 months,  "I have not felt good." He is only able to further describe this as increased fatigue in the afternoons. He does not report associated chest pain, SOB, palpitations, nausea, or vomiting with these generalized complaint. Over the past 3-4 weeks, he has noted intermittent left-sided chest pain that is occasionally associated with exertion, though will come at rest as well. Pain radiates to the left shoulder. It does not feel like his prior MI in 2001 or like the pain that led to his LHC in 01/2015. When the pain comes one, it lasts for several minutes and self resolves. He is having multiple episodes of this pain per day at this point. No associated SOB, diaphoresis, nausea, vomiting, palpitations, dizziness, presyncope, or syncope. He last had an episode of this pain this morning. Currently, symptom free. He prefers LHC over stress testing given the stress test he had in 08/2014 was normal and follow up [place] for persistent symptoms showed multi-vessel CAD requiring bypass.    Past Medical History:  Diagnosis Date  . Allergy    seasonal  . Arthritis    all over- in general   . CAD (coronary artery disease)    a. inferior wall MI 10/01 s/p PCI/DES to RCA; b. Myoview 3/16 neg for ischemia; c. LHC 8/16: ostLAD 80%, OM1 70%, OM2 70% x 2 lesions, mRCA 30%, dRCA 70% s/p 4-V CABG 01/24/15 (LIMA-LAD, VG- OM1, VG-OM2, VG-PDA)   . Cancer (HCC)    skin, melanoma  . Carotid artery disease ([place])    a. [place] 8/16: 1-39% bilateral ICA stenosis  . Cataract    removed  .  Diastolic dysfunction    a. TTE 8/16: EF 55-60%, no RWMA, Gr1DD, calcified mitral annulus, mild biatrial enlargement  . Erectile dysfunction   . HLD (hyperlipidemia)   . HTN (hypertension)   . Inferior myocardial infarction ([place]) 10/01   stent RCA  . Postoperative wound infection 02/02/2015  . Reflux esophagitis   . Sleep apnea 2017   CPAP at night    Past Surgical History:  Procedure Laterality Date  . arm surgery  2010  . CARDIAC  CATHETERIZATION  06/24/11  . CARDIAC CATHETERIZATION N/A 01/18/2015   Procedure: Left Heart Cath with coronary angiography;  Surgeon: Minna Merritts, MD;  Location: [facility];  Service: Cardiovascular;  Laterality: N/A;  . CARDIAC CATHETERIZATION N/A 01/18/2015   Procedure: Intravascular Pressure Wire/FFR Study;  Surgeon: [name], MD;  Location: [facility];  Service: Cardiovascular;  Laterality: N/A;  . CAROTID STENT  03/10/2011  . COLONOSCOPY  2010  . CORONARY ARTERY BYPASS GRAFT N/A 01/24/2015   Procedure: CORONARY ARTERY BYPASS GRAFTING x 4 (LIMA-LAD, SVG-Int 1- Int 2, SVG-PD) ENDOSCOPIC GREATER SAPHENOUS VEIN HARVEST LEFT LEG;  Surgeon: [name], MD;  Location: [facility];  Service: Open Heart Surgery;  Laterality: N/A;  . ESOPHAGOGASTRODUODENOSCOPY (EGD) WITH PROPOFOL N/A 04/24/2016   Procedure: ESOPHAGOGASTRODUODENOSCOPY (EGD) WITH PROPOFOL;  Surgeon: Jerene Bears, MD;  [facility];  Service: Gastroenterology;  Laterality: N/A;  . EYE SURGERY     lasik 15 yrs. ago, cataracts removed - both eyes   . HAMMER TOE SURGERY     right toe  . NASAL SINUS SURGERY  2008   septpolasty, bilateral turbinate reduction  . SHOULDER ARTHROSCOPY  2012  . TEE WITHOUT CARDIOVERSION N/A 01/24/2015   Procedure: TRANSESOPHAGEAL ECHOCARDIOGRAM (TEE);  Surgeon: [name], MD;  Location: [facility];  Service: Open Heart Surgery;  Laterality: N/A;  . [place]  . WRIST SURGERY  2011    Current Meds  Medication Sig  . aspirin 81 MG EC tablet Take 81 mg by mouth daily.    . Calcium Carbonate-Vitamin D (CALCIUM-VITAMIN D) 600-125 MG-UNIT TABS Take 1 tablet by mouth daily.  . cholecalciferol (VITAMIN D) 1000 units tablet Take 1,000 Units by mouth daily.  [name] lisinopril (PRINIVIL,ZESTRIL) 10 MG tablet Take 1 tablet (10 mg total) by mouth daily.  . metoprolol succinate (TOPROL XL) 25 MG 24 hr tablet Take 0.5 tablets (12.5 mg total) by mouth daily.  .  nitroGLYCERIN (NITROSTAT) 0.4 MG SL tablet Place 1 tablet (0.4 mg total) under the tongue every 5 (five) minutes as needed for chest pain.  . simvastatin (ZOCOR) 40 MG tablet TAKE ONE TABLET BY MOUTH AT BEDTIME.  . traMADol (ULTRAM) 50 MG tablet TAKE 1 TABLET BY MOUTH THREE TIMES DAILYAS NEEDED  . vitamin B-12 (CYANOCOBALAMIN) 1000 MCG tablet Take 1,000 mcg by mouth daily.  . vitamin C (ASCORBIC ACID) 500 MG tablet Take 500 mg by mouth daily.  . Zinc 100 MG TABS Take 1 tablet by mouth daily.      Allergies:   Patient has no known allergies.   Social History:  The patient  reports that he quit smoking about 17 years ago. His smoking use included cigarettes. He has a 100.00 pack-year smoking history. he has never used smokeless tobacco. He reports that he drinks about 6.0 oz of alcohol per week. He reports that he does not use drugs.   Family History:  [name] family history includes Cancer  in his paternal grandfather; Diabetes in his father; Heart disease in his mother; Heart disease (age of onset: 77) in his brother; Hypertension in his father and mother.  ROS:   Review of Systems  Constitutional: Positive for malaise/fatigue. Negative for chills, diaphoresis, fever and weight loss.  HENT: Negative for congestion.   Eyes: Negative for discharge and redness.  Respiratory: Negative for cough, hemoptysis, sputum production, shortness of breath and wheezing.   Cardiovascular: Positive for chest pain. Negative for palpitations, orthopnea, claudication, leg swelling and PND.  Gastrointestinal: Negative for abdominal pain, blood in stool, heartburn, melena, nausea and vomiting.  Genitourinary: Negative for hematuria.  Musculoskeletal: Negative for falls and myalgias.  Skin: Negative for rash.  Neurological: Positive for weakness. Negative for dizziness, tingling, tremors, sensory change, speech change, focal weakness and loss of consciousness.  Endo/Heme/Allergies: Does not bruise/bleed  easily.  Psychiatric/Behavioral: Negative for substance abuse. The patient is not nervous/anxious.   All other systems reviewed and are negative.    PHYSICAL EXAM:  VS:  BP 118/60 (BP Location: Left Arm, Patient Position: Sitting, Cuff Size: Large)   Pulse 73   Ht 5\' 8"  (1.727 m)   Wt 211 lb (95.7 kg)   BMI 32.08 kg/m  BMI: Body mass index is 32.08 kg/m.  Physical Exam  Constitutional: He is oriented to person, place, and time. He appears well-developed and well-nourished.  HENT:  Head: Normocephalic and atraumatic.  Eyes: Right eye exhibits no discharge. Left eye exhibits no discharge.  Neck: Normal range of motion. No JVD present.  Cardiovascular: Normal rate, regular rhythm, S1 normal, S2 normal and normal heart sounds. Exam reveals no distant heart sounds, no friction rub, no midsystolic click and no opening snap.  No murmur heard. Pulses:      Posterior tibial pulses are 2+ on the right side, and 2+ on the left side.  Pulmonary/Chest: Effort normal and breath sounds normal. No respiratory distress. He has no decreased breath sounds. He has no wheezes. He has no rales. He exhibits no tenderness.  Abdominal: Soft. He exhibits no distension. There is no tenderness.  Musculoskeletal: He exhibits no edema.  Neurological: He is alert and oriented to person, place, and time.  Skin: Skin is warm and dry. No cyanosis. Nails show no clubbing.  Psychiatric: He has a normal mood and affect. His speech is normal and behavior is normal. Judgment and thought content normal.     EKG:  Was ordered and interpreted by me today. Shows NSR, 73 bpm, nonspecific st/t changes (no significant change from prior)  Recent Labs: 11/27/2016: ALT 16; BUN 20; Creatinine, Ser 1.09; Hemoglobin 14.7; Platelets 204.0; Potassium 4.2; Sodium 138; TSH 1.32  07/14/2016: Cholesterol 127; HDL 47.30; LDL Cholesterol 66; Total CHOL/HDL Ratio 3; Triglycerides 65.0; VLDL 13.0   CrCl cannot be calculated (Patient's most  recent lab result is older than the maximum 21 days allowed.).   Wt Readings from Last 3 Encounters:  06/04/17 211 lb (95.7 kg)  05/21/17 207 lb 1.6 oz (93.9 kg)  05/04/17 208 lb (94.3 kg)     Other studies reviewed: Additional studies/records reviewed today include: summarized above  ASSESSMENT AND PLAN:  1. Multi-vessel CAD in the native coronary artery with unstable angina: -Currently chest pain free -He prefers LHC over stress testing given stress test in 08/2014 was low risk and LHC in 01/2015 for persistent chest pain showed multi-vessel CAD requiring CABG -Schedule LHC with [name] -Check echo -ASA -Simvastatin -Toprol XL -Lisinopril  -SL NTG  prn (has not needed any) -Risks and benefits of cardiac catheterization have been discussed with the patient including risks of bleeding, bruising, infection, kidney damage, stroke, heart attack, and death. The patient understands these risks and is willing to proceed with the procedure. All questions have been answered and concerns listened to  2. Fatigue:  -Ischemic evaluation and echo as above -Pre-cath labs -If cardiac workup is unrevealing of etiology recommend follow up with PCP  3. Post-operative Afib: -No evidence of further arrhythmia since bypass in 01/2015  4. Carotid artery disease:  -1-39% bilateral carotid artery stenosis noted on carotid ultrasound in 01/2015 -Medically managed -Simvastatin -LDL 66 as of 07/2016  5. HTN: -Well controlled -Continue current medications  6. Reflux:  -Per PCP  Disposition: F/u with [name] pending LHC results.    Current medicines are reviewed at length with the patient today.  The patient did not have any concerns regarding medicines.  Signed, Christell Faith, PA-C 06/04/2017 1:42 PM     [address] [place], [place] [address] [phone]

## 2017-06-05 ENCOUNTER — Telehealth: Payer: Self-pay | Admitting: Cardiovascular Disease

## 2017-06-05 DIAGNOSIS — M62830 Muscle spasm of back: Secondary | ICD-10-CM | POA: Diagnosis not present

## 2017-06-05 DIAGNOSIS — M9902 Segmental and somatic dysfunction of thoracic region: Secondary | ICD-10-CM | POA: Diagnosis not present

## 2017-06-05 DIAGNOSIS — M9901 Segmental and somatic dysfunction of cervical region: Secondary | ICD-10-CM | POA: Diagnosis not present

## 2017-06-05 DIAGNOSIS — M5033 Other cervical disc degeneration, cervicothoracic region: Secondary | ICD-10-CM | POA: Diagnosis not present

## 2017-06-05 NOTE — Telephone Encounter (Signed)
Patient states he is returning a call he received yesterday

## 2017-06-05 NOTE — Telephone Encounter (Signed)
Pt returned call regarding lab and CXR results. Call pt back. No answer. Left detailed message w/results and call back number if questions on cell VM.  See results note

## 2017-06-10 ENCOUNTER — Encounter: Payer: Self-pay | Admitting: *Deleted

## 2017-06-10 ENCOUNTER — Encounter
Admission: RE | Disposition: A | Discharge status: Home / Self Care | Payer: Self-pay | Source: Ambulatory Visit | Attending: Cardiovascular Disease

## 2017-06-10 ENCOUNTER — Ambulatory Visit
Admission: RE | Admit: 2017-06-10 | Discharge: 2017-06-10 | Disposition: A | Discharge status: Home / Self Care | Payer: PPO | Source: Ambulatory Visit | Attending: Cardiovascular Disease | Admitting: Cardiovascular Disease

## 2017-06-10 DIAGNOSIS — G4733 Obstructive sleep apnea (adult) (pediatric): Secondary | ICD-10-CM | POA: Diagnosis not present

## 2017-06-10 DIAGNOSIS — Z955 Presence of coronary angioplasty implant and graft: Secondary | ICD-10-CM | POA: Diagnosis not present

## 2017-06-10 DIAGNOSIS — Z87891 Personal history of nicotine dependence: Secondary | ICD-10-CM | POA: Diagnosis not present

## 2017-06-10 DIAGNOSIS — I25110 Atherosclerotic heart disease of native coronary artery with unstable angina pectoris: Secondary | ICD-10-CM | POA: Diagnosis not present

## 2017-06-10 DIAGNOSIS — E669 Obesity, unspecified: Secondary | ICD-10-CM | POA: Insufficient documentation

## 2017-06-10 DIAGNOSIS — Z951 Presence of aortocoronary bypass graft: Secondary | ICD-10-CM | POA: Diagnosis not present

## 2017-06-10 DIAGNOSIS — Z9989 Dependence on other enabling machines and devices: Secondary | ICD-10-CM | POA: Diagnosis not present

## 2017-06-10 DIAGNOSIS — K219 Gastro-esophageal reflux disease without esophagitis: Secondary | ICD-10-CM | POA: Insufficient documentation

## 2017-06-10 DIAGNOSIS — Z85820 Personal history of malignant melanoma of skin: Secondary | ICD-10-CM | POA: Diagnosis not present

## 2017-06-10 DIAGNOSIS — I252 Old myocardial infarction: Secondary | ICD-10-CM | POA: Insufficient documentation

## 2017-06-10 DIAGNOSIS — Z6832 Body mass index (BMI) 32.0-32.9, adult: Secondary | ICD-10-CM | POA: Insufficient documentation

## 2017-06-10 DIAGNOSIS — I200 Unstable angina: Secondary | ICD-10-CM

## 2017-06-10 DIAGNOSIS — R079 Chest pain, unspecified: Secondary | ICD-10-CM | POA: Diagnosis present

## 2017-06-10 DIAGNOSIS — I10 Essential (primary) hypertension: Secondary | ICD-10-CM | POA: Insufficient documentation

## 2017-06-10 DIAGNOSIS — R5383 Other fatigue: Secondary | ICD-10-CM | POA: Diagnosis not present

## 2017-06-10 DIAGNOSIS — I2510 Atherosclerotic heart disease of native coronary artery without angina pectoris: Secondary | ICD-10-CM | POA: Insufficient documentation

## 2017-06-10 HISTORY — DX: Other specified postprocedural states: Z98.890

## 2017-06-10 HISTORY — PX: LEFT HEART CATH AND CORONARY ANGIOGRAPHY: CATH118249

## 2017-06-10 SURGERY — LEFT HEART CATH AND CORONARY ANGIOGRAPHY
Anesthesia: Moderate Sedation

## 2017-06-10 SURGERY — LEFT HEART CATH AND CORONARY ANGIOGRAPHY
Anesthesia: Moderate Sedation | Laterality: Left

## 2017-06-10 MED ORDER — SODIUM CHLORIDE 0.9% FLUSH: 3.0000 mL | INTRAVENOUS | Status: DC | PRN

## 2017-06-10 MED ORDER — IOPAMIDOL (ISOVUE-300) INJECTION 61%
INTRAVENOUS | Status: DC | PRN
  Administered 2017-06-10: 150 mL via INTRA_ARTERIAL

## 2017-06-10 MED ORDER — SODIUM CHLORIDE 0.9 % WEIGHT BASED INFUSION
3.0000 mL/kg/h | INTRAVENOUS | Status: AC
  Administered 2017-06-10: 3 mL/kg/h via INTRAVENOUS

## 2017-06-10 MED ORDER — SODIUM CHLORIDE 0.9 % IV SOLN: 250.0000 mL | INTRAVENOUS | Status: DC | PRN

## 2017-06-10 MED ORDER — SODIUM CHLORIDE 0.9% FLUSH: 3.0000 mL | Freq: Two times a day (BID) | INTRAVENOUS | Status: DC

## 2017-06-10 MED ORDER — ACETAMINOPHEN 325 MG PO TABS: 650.0000 mg | ORAL_TABLET | ORAL | Status: DC | PRN

## 2017-06-10 MED ORDER — ONDANSETRON HCL 4 MG/2ML IJ SOLN: 4.0000 mg | Freq: Four times a day (QID) | INTRAMUSCULAR | Status: DC | PRN

## 2017-06-10 MED ORDER — MIDAZOLAM HCL 2 MG/2ML IJ SOLN
INTRAMUSCULAR | Status: AC
  Filled 2017-06-10: qty 2

## 2017-06-10 MED ORDER — MIDAZOLAM HCL 2 MG/2ML IJ SOLN
INTRAMUSCULAR | Status: DC | PRN
  Administered 2017-06-10: 1 mg via INTRAVENOUS

## 2017-06-10 MED ORDER — HEPARIN (PORCINE) IN NACL 2-0.9 UNIT/ML-% IJ SOLN
INTRAMUSCULAR | Status: AC
  Filled 2017-06-10: qty 500

## 2017-06-10 MED ORDER — FENTANYL CITRATE (PF) 100 MCG/2ML IJ SOLN
INTRAMUSCULAR | Status: DC | PRN
  Administered 2017-06-10: 50 ug via INTRAVENOUS

## 2017-06-10 MED ORDER — FENTANYL CITRATE (PF) 100 MCG/2ML IJ SOLN
INTRAMUSCULAR | Status: AC
  Filled 2017-06-10: qty 2

## 2017-06-10 MED ORDER — SODIUM CHLORIDE 0.9 % WEIGHT BASED INFUSION: 1.0000 mL/kg/h | INTRAVENOUS | Status: DC

## 2017-06-10 SURGICAL SUPPLY — 13 items
CATH INFINITI 5 FR IM (CATHETERS) ×1 IMPLANT
CATH INFINITI 5FR ANG PIGTAIL (CATHETERS) ×1 IMPLANT
CATH INFINITI 5FR JL4 (CATHETERS) ×1 IMPLANT
CATH INFINITI JR4 5F (CATHETERS) ×1 IMPLANT
DEVICE CLOSURE MYNXGRIP 5F (Vascular Products) ×1 IMPLANT
GUIDEWIRE 3MM J TIP .035 145 (WIRE) ×1 IMPLANT
KIT MANI 3VAL PERCEP (MISCELLANEOUS) ×2 IMPLANT
NDL PERC 18GX7CM (NEEDLE) IMPLANT
NEEDLE PERC 18GX7CM (NEEDLE) ×2 IMPLANT
NEEDLE SMART 18G ACCESS (NEEDLE) ×1 IMPLANT
PACK CARDIAC CATH (CUSTOM PROCEDURE TRAY) ×2 IMPLANT
SHEATH AVANTI 5FR X 11CM (SHEATH) ×1 IMPLANT
WIRE EMERALD 3MM-J .035X260CM (WIRE) ×1 IMPLANT

## 2017-06-18 ENCOUNTER — Ambulatory Visit (INDEPENDENT_AMBULATORY_CARE_PROVIDER_SITE_OTHER): Payer: PPO

## 2017-06-18 ENCOUNTER — Other Ambulatory Visit: Payer: Self-pay

## 2017-06-18 DIAGNOSIS — I480 Paroxysmal atrial fibrillation: Secondary | ICD-10-CM | POA: Diagnosis not present

## 2017-06-20 NOTE — Progress Notes (Signed)
Cardiology Office Note  Date:  06/22/2017   ID:  [name], DOB 28-Mar-1941, MRN [id number]  PCP:  Ria Bush, MD   Chief Complaint  Patient presents with  . Other    Follow up s/p cardiac cath. Meds reviewed by the pt. verbally. Pt. c/o chest pain at times.     HPI:   Mr. Kenton is a very pleasant 77 year old gentleman with a history of  coronary artery disease, inferior wall MI 2001 with stent to the RCA,    hypertension,  hyperlipidemia.  Previous admit in 2011 for mild chest pain and sweating.  Stress test showed no ischemia with inferior scar.  Previous cardiac catheterization showing moderate OM disease.  Repeat catheterization  in August 2016 showing severe three-vessel disease,  sent for CABG on 01/24/2015.  He presents today for follow-up of his coronary artery disease  Cardiac catheterization 06/10/2017 Severe three vessel disease, occluded SVG to the RCA, Patent SVG to OM 1 and OM2, patent LIMA to the LAD Will try medical management first Add plavix, nitrates  intervention would require atherectomy in [place]  In follow-up today he continues to exercise 45 minutes up to one hour on the treadmill at the gym 4-5 days per week GERD sx improved on PPI EGD  07/2014 showing moderate to severe esophagitis  Long discussion concerning recent car to catheterization  Going to [place] for vacation March 2019  History of sleep apnea on CPAP  Active at baseline, no symptoms concerning for angina Back pain, ,hip pain, plays softball in the warm weather  Lab work reviewed with him in detail Total chol 127, LDL 67  EKG personally reviewed by myself on today's visit shows normal sinus rhythm with rate 75 bpm,   old inferior MI,  no other significant ST or T-wave changes   Other past medical history reviewed postoperative atrial fibrillation,  postoperative delirium and began having fevers on 01/29/2015.  some drainage from the inferior portion of  his sternal wound. Blood cultures and wound cultures grew Enterobacter aerogenes. He was discharged on IV ceftriaxone  and completed 6 weeks of therapy . He took probiotics during this time  right foot surgery in May 2016. repeat hardware fusion of his right great toe and had hammertoe repair of his right second toe.   postoperative infection with persistent drainage from the second toe. A culture in late July grew MSSA. course of doxycycline.  The drainage stopped but he has continued to have redness and swelling  He still has some pain when walking   In 2013, he was having worsening chest pain medically with exertion.  cardiac catheterization that showed moderate obtuse marginal disease, otherwise no significant stenosis. This was discussed with the interventional physicians in [facility] and recommendation was made for medical management.  prior EGD  that showed an ulcer. Previously had trouble swallowing.   PMH:   has a past medical history of Allergy, Arthritis, CAD (coronary artery disease), Cancer ([place]), Carotid artery disease ([place]), Cataract, Diastolic dysfunction, Erectile dysfunction, History of elbow surgery, HLD (hyperlipidemia), HTN (hypertension), Inferior myocardial infarction ([place]) (10/01), Postoperative wound infection (02/02/2015), Reflux esophagitis, and Sleep apnea (2017).  PSH:    Past Surgical History:  Procedure Laterality Date  . arm surgery  2010  . CARDIAC CATHETERIZATION  06/24/11  . CARDIAC CATHETERIZATION N/A 01/18/2015   Procedure: Left Heart Cath with coronary angiography;  Surgeon: Minna Merritts, MD;  Location: [facility];  Service: Cardiovascular;  Laterality: N/A;  .  CARDIAC CATHETERIZATION N/A 01/18/2015   Procedure: Intravascular Pressure Wire/FFR Study;  Surgeon: [name], MD;  Location: [facility];  Service: Cardiovascular;  Laterality: N/A;  . CAROTID STENT  03/10/2011  . COLONOSCOPY  2010  . CORONARY ARTERY BYPASS GRAFT  N/A 01/24/2015   Procedure: CORONARY ARTERY BYPASS GRAFTING x 4 (LIMA-LAD, SVG-Int 1- Int 2, SVG-PD) ENDOSCOPIC GREATER SAPHENOUS VEIN HARVEST LEFT LEG;  Surgeon: [name], MD;  Location: [facility];  Service: Open Heart Surgery;  Laterality: N/A;  . ESOPHAGOGASTRODUODENOSCOPY (EGD) WITH PROPOFOL N/A 04/24/2016   Procedure: ESOPHAGOGASTRODUODENOSCOPY (EGD) WITH PROPOFOL;  Surgeon: Jerene Bears, MD;  [facility];  Service: Gastroenterology;  Laterality: N/A;  . EYE SURGERY     lasik 15 yrs. ago, cataracts removed - both eyes   . HAMMER TOE SURGERY     right toe  . LEFT HEART CATH AND CORONARY ANGIOGRAPHY Left 06/10/2017   Procedure: LEFT HEART CATH AND CORONARY ANGIOGRAPHY;  Surgeon: Minna Merritts, MD;  Location: [facility];  Service: Cardiovascular;  Laterality: Left;  . NASAL SINUS SURGERY  2008   septpolasty, bilateral turbinate reduction  . SHOULDER ARTHROSCOPY  2012  . TEE WITHOUT CARDIOVERSION N/A 01/24/2015   Procedure: TRANSESOPHAGEAL ECHOCARDIOGRAM (TEE);  Surgeon: [name], MD;  Location: [facility];  Service: Open Heart Surgery;  Laterality: N/A;  . [place]  . WRIST SURGERY  2011    Current Outpatient Medications  Medication Sig Dispense Refill  . aspirin 81 MG EC tablet Take 81 mg by mouth daily.      . Calcium Carbonate-Vitamin D (CALCIUM-VITAMIN D) 600-125 MG-UNIT TABS Take 1 tablet by mouth daily.    . cholecalciferol (VITAMIN D) 1000 units tablet Take 1,000 Units by mouth daily.    [name] ibuprofen (ADVIL,MOTRIN) 200 MG tablet Take 800 mg by mouth every 6 (six) hours as needed for moderate pain.    [name] lisinopril (PRINIVIL,ZESTRIL) 10 MG tablet Take 1 tablet (10 mg total) by mouth daily. 90 tablet 3  . metoprolol succinate (TOPROL XL) 25 MG 24 hr tablet Take 0.5 tablets (12.5 mg total) by mouth daily. 45 tablet 3  . nitroGLYCERIN (NITROSTAT) 0.4 MG SL tablet Place 1 tablet (0.4 mg total) under the tongue every 5 (five) minutes as needed for  chest pain. 25 tablet 3  . pantoprazole (PROTONIX) 40 MG tablet Take 1 tablet (40 mg total) by mouth daily. 90 tablet 3  . simvastatin (ZOCOR) 40 MG tablet TAKE ONE TABLET BY MOUTH AT BEDTIME. 90 tablet 3  . traMADol (ULTRAM) 50 MG tablet TAKE 1 TABLET BY MOUTH THREE TIMES DAILYAS NEEDED 90 tablet 0  . vitamin B-12 (CYANOCOBALAMIN) 1000 MCG tablet Take 1,000 mcg by mouth daily.    . vitamin C (ASCORBIC ACID) 500 MG tablet Take 500 mg by mouth daily.    . Zinc 100 MG TABS Take 1 tablet by mouth daily.       No current facility-administered medications for this visit.      Allergies:   Patient has no known allergies.   Social History:  The patient  reports that he quit smoking about 17 years ago. His smoking use included cigarettes. He has a 100.00 pack-year smoking history. he has never used smokeless tobacco. He reports that he drinks about 6.0 oz of alcohol per week. He reports that he does not use drugs.   Family History:   family history includes Cancer in his paternal grandfather;  Diabetes in his father; Heart disease in his mother; Heart disease (age of onset: 21) in his brother; Hypertension in his father and mother.    Review of Systems: Review of Systems  Constitutional: Negative.   Respiratory: Negative.   Cardiovascular: Negative.   Gastrointestinal: Negative.   Musculoskeletal: Negative.   Neurological: Negative.   Psychiatric/Behavioral: Negative.   All other systems reviewed and are negative.    PHYSICAL EXAM: VS:  BP 120/62 (BP Location: Left Arm, Patient Position: Sitting, Cuff Size: Normal)   Pulse 74   Ht 5\' 8"  (1.727 m)   Wt 211 lb 8 oz (95.9 kg)   BMI 32.16 kg/m  , BMI Body mass index is 32.16 kg/m.  GEN: Well nourished, well developed, in no acute distress , Obese  HEENT: normal  Neck: no JVD, carotid bruits, or masses Cardiac: RRR; no murmurs, rubs, or gallops,no edema  Respiratory:  clear to auscultation bilaterally, normal work of breathing GI:  soft, nontender, nondistended, + BS MS: no deformity or atrophy  Skin: warm and dry, no rash Neuro:  Strength and sensation are intact Psych: euthymic mood, full affect  Recent Labs: 11/27/2016: ALT 16; TSH 1.32 06/04/2017: BUN 20; Creatinine, Ser 0.98; Hemoglobin 14.9; Platelets 205; Potassium 3.8; Sodium 136    Lipid Panel Lab Results  Component Value Date   CHOL 127 07/14/2016   HDL 47.30 07/14/2016   LDLCALC 66 07/14/2016   TRIG 65.0 07/14/2016      Wt Readings from Last 3 Encounters:  06/22/17 211 lb 8 oz (95.9 kg)  06/10/17 211 lb (95.7 kg)  06/04/17 211 lb (95.7 kg)     ASSESSMENT AND PLAN:   Atherosclerosis of native coronary artery of native heart without angina pectoris -  Currently with no symptoms of angina. No further workup at this time. Continue current medication regimen.He is not requiring nitroglycerin   discussed previous cardiac catheterization results, he does not see urgent need for atherectomy of right coronary artery We did discuss starting Xarelto 2.5 mill grams twice a day with his aspirin.  "I'm thin enough" reports that he bleeds a lot  Paroxysmal atrial fibrillation ([place]) - Plan: EKG 12-Lead Prior history of postop A. fib after bypass surgery  no symptoms concerning for arrhythmia  Does not want anticoagulation   PAD (peripheral artery disease) (HCC)  mild carotid arterial disease seen in 2016  Medical management ,  stable   Pure hypercholesterolemia Cholesterol is at goal on the current lipid regimen. No changes to the medications were made. stable   Essential hypertension Blood pressure is well controlled on today's visit. No changes made to the medications. stable   S/P CABG x 4 No anginal symptoms, active, exercising No further workup at this time  GERD moderate to severe esophagitis on EGD 2 years ago  Recommended he stay on PPI  erectile dysfunction bladder does not work  He would be Acceptable risk to have shots  performed     Total encounter time more than 25 minutes  Greater than 50% was spent in counseling and coordination of care with the patient   Total encounter time more than 25 minutes  Greater than 50% was spent in counseling and coordination of care with the patient  Disposition:   F/U  12 months   Orders Placed This Encounter  Procedures  . EKG 12-Lead     Signed, Esmond Plants, M.D., Ph.D. 06/22/2017  [place], [place]

## 2017-06-22 ENCOUNTER — Ambulatory Visit: Payer: PPO | Admitting: Cardiovascular Disease

## 2017-06-22 ENCOUNTER — Encounter: Payer: Self-pay | Admitting: Cardiovascular Disease

## 2017-06-22 VITALS — BP 120/62 | HR 74 | Ht 68.0 in | Wt 211.5 lb

## 2017-06-22 DIAGNOSIS — I10 Essential (primary) hypertension: Secondary | ICD-10-CM | POA: Diagnosis not present

## 2017-06-22 DIAGNOSIS — I739 Peripheral vascular disease, unspecified: Secondary | ICD-10-CM | POA: Diagnosis not present

## 2017-06-22 DIAGNOSIS — M9902 Segmental and somatic dysfunction of thoracic region: Secondary | ICD-10-CM | POA: Diagnosis not present

## 2017-06-22 DIAGNOSIS — I25111 Atherosclerotic heart disease of native coronary artery with angina pectoris with documented spasm: Secondary | ICD-10-CM | POA: Diagnosis not present

## 2017-06-22 DIAGNOSIS — E782 Mixed hyperlipidemia: Secondary | ICD-10-CM | POA: Diagnosis not present

## 2017-06-22 DIAGNOSIS — M5033 Other cervical disc degeneration, cervicothoracic region: Secondary | ICD-10-CM | POA: Diagnosis not present

## 2017-06-22 DIAGNOSIS — Z951 Presence of aortocoronary bypass graft: Secondary | ICD-10-CM

## 2017-06-22 DIAGNOSIS — A498 Other bacterial infections of unspecified site: Secondary | ICD-10-CM | POA: Diagnosis not present

## 2017-06-22 DIAGNOSIS — I25110 Atherosclerotic heart disease of native coronary artery with unstable angina pectoris: Secondary | ICD-10-CM | POA: Diagnosis not present

## 2017-06-22 DIAGNOSIS — M9901 Segmental and somatic dysfunction of cervical region: Secondary | ICD-10-CM | POA: Diagnosis not present

## 2017-06-22 DIAGNOSIS — I480 Paroxysmal atrial fibrillation: Secondary | ICD-10-CM

## 2017-06-22 DIAGNOSIS — M62830 Muscle spasm of back: Secondary | ICD-10-CM | POA: Diagnosis not present

## 2017-06-22 MED ORDER — METOPROLOL SUCCINATE ER 25 MG PO TB24: 12.5000 mg | ORAL_TABLET | Freq: Every day | ORAL | 3 refills | Status: DC

## 2017-06-22 MED ORDER — PANTOPRAZOLE SODIUM 40 MG PO TBEC: 40.0000 mg | DELAYED_RELEASE_TABLET | Freq: Every day | ORAL | 3 refills | Status: DC

## 2017-06-22 NOTE — Patient Instructions (Signed)
Medication Instructions:   Research xarelto 2.5 mg twice a day (blood thinner) Take with aspirin 81 mg  Labwork:  No new labs needed  Testing/Procedures:  No further testing at this time   Follow-Up: It was a pleasure seeing you in the office today. Please call us if you have new issues that need to be addressed before your next appt.  [phone]  Your physician wants you to follow-up in: 12 months.  You will receive a reminder letter in the mail two months in advance. If you don't receive a letter, please call our office to schedule the follow-up appointment.  If you need a refill on your cardiac medications before your next appointment, please call your pharmacy.

## 2017-06-23 ENCOUNTER — Ambulatory Visit: Payer: PPO | Admitting: Cardiovascular Disease

## 2017-06-25 ENCOUNTER — Other Ambulatory Visit: Payer: Self-pay | Admitting: Internal Medicine

## 2017-06-25 DIAGNOSIS — M5033 Other cervical disc degeneration, cervicothoracic region: Secondary | ICD-10-CM | POA: Diagnosis not present

## 2017-06-25 DIAGNOSIS — M9901 Segmental and somatic dysfunction of cervical region: Secondary | ICD-10-CM | POA: Diagnosis not present

## 2017-06-25 DIAGNOSIS — M62830 Muscle spasm of back: Secondary | ICD-10-CM | POA: Diagnosis not present

## 2017-06-25 DIAGNOSIS — M9902 Segmental and somatic dysfunction of thoracic region: Secondary | ICD-10-CM | POA: Diagnosis not present

## 2017-07-07 ENCOUNTER — Other Ambulatory Visit: Payer: Self-pay | Admitting: Family Medicine

## 2017-07-07 DIAGNOSIS — M9901 Segmental and somatic dysfunction of cervical region: Secondary | ICD-10-CM | POA: Diagnosis not present

## 2017-07-07 DIAGNOSIS — M62830 Muscle spasm of back: Secondary | ICD-10-CM | POA: Diagnosis not present

## 2017-07-07 DIAGNOSIS — M9902 Segmental and somatic dysfunction of thoracic region: Secondary | ICD-10-CM | POA: Diagnosis not present

## 2017-07-07 DIAGNOSIS — M5033 Other cervical disc degeneration, cervicothoracic region: Secondary | ICD-10-CM | POA: Diagnosis not present

## 2017-07-07 NOTE — Telephone Encounter (Signed)
Last filled:  05/29/17, #90 Last OV:  05/04/17 Next OV:  none

## 2017-07-08 NOTE — Telephone Encounter (Signed)
Sent electronically 

## 2017-07-13 ENCOUNTER — Telehealth: Payer: Self-pay | Admitting: Cardiovascular Disease

## 2017-07-13 NOTE — Telephone Encounter (Signed)
Unclear what dose of Prilosec he should be on, We will forward on to Dr. Danise Mina for his advice Thanks TG

## 2017-07-13 NOTE — Telephone Encounter (Signed)
Randall Ali.  Saw Dr [name] on 06/22/17, and Dr [name] sent in new prescription for protonix. Ali states that is not working well for him and his prilosec worked better.  Ali would like Dr [name] to switch him back to prilosec. Encouraged Ali to contact PCP or GI. He sees Dr Raquel James in [place] for GI but states last time he called Dr Hilarie Fredrickson for refill he said Dr [name] would need to handle because of other medications Ali is on. Advised I will route to Dr [name].

## 2017-07-13 NOTE — Telephone Encounter (Signed)
Pt calling stating a few weeks ago we took him off  Prilosec and we placed him on Protonix  He states he would like to go back on the other medication  For he is getting more heartburn   Please advise

## 2017-07-14 MED ORDER — OMEPRAZOLE 40 MG PO CPDR: 40.0000 mg | DELAYED_RELEASE_CAPSULE | Freq: Every day | ORAL | 1 refills | Status: DC

## 2017-07-14 NOTE — Telephone Encounter (Signed)
Spoke with patient and reviewed that his PCP sent in medication requested and will follow up with him at next office visit. He verbalized understanding with no further questions at this time.

## 2017-07-14 NOTE — Telephone Encounter (Signed)
plz notify I have sent omeprazole (prilosec) 40mg  daily to his pharmacy. We can review effect at next appointment.

## 2017-07-16 ENCOUNTER — Ambulatory Visit: Payer: PPO | Admitting: Urology

## 2017-07-16 ENCOUNTER — Encounter: Payer: Self-pay | Admitting: Urology

## 2017-07-16 VITALS — BP 112/68 | HR 70 | Ht 68.0 in | Wt 207.5 lb

## 2017-07-16 DIAGNOSIS — N529 Male erectile dysfunction, unspecified: Secondary | ICD-10-CM | POA: Diagnosis not present

## 2017-07-16 DIAGNOSIS — M62830 Muscle spasm of back: Secondary | ICD-10-CM | POA: Diagnosis not present

## 2017-07-16 DIAGNOSIS — M5033 Other cervical disc degeneration, cervicothoracic region: Secondary | ICD-10-CM | POA: Diagnosis not present

## 2017-07-16 DIAGNOSIS — M9902 Segmental and somatic dysfunction of thoracic region: Secondary | ICD-10-CM | POA: Diagnosis not present

## 2017-07-16 DIAGNOSIS — M9901 Segmental and somatic dysfunction of cervical region: Secondary | ICD-10-CM | POA: Diagnosis not present

## 2017-07-16 NOTE — Progress Notes (Signed)
07/16/2017 3:24 PM   Randall Ali [birth year] [id number]  Referring provider: Ria Bush, MD [address] [place], [place] [address]  Chief Complaint  Patient presents with  . Erectile Dysfunction    HPI: Patient is a 76 year old gentleman with multiple medical comorbidities including four-vessel coronary artery disease status post CABG in August 2016 and PAD who is currently on nitrates who presents today for follow up of erectile dysfunction.  He has tried both Viagra and Cialis which he found ineffective.  He apparently obtained these from a pharmacy [place] of this [place].  He is unable to obtain or maintain sufficient erection for intercourse.  He notes that he gets about three quarters of its full thickness.  He notes occasional morning erections.  He states his cardiac status has been stable for the last 2 years since his CABG.  He does have a regular cardiologist.  Outside of his CABG, he does have a previous history of an MI as well.  Due to the nitrates, the plan at his last visit was to start penile injection therapy.  However, he needed to obtain clearance from his cardiologist prior to this.  He has since seen [name] from cardiology who believes he is at acceptable risk for penile injections.      PMH: Past Medical History:  Diagnosis Date  . Allergy    seasonal  . Arthritis    all over- in general   . CAD (coronary artery disease)    a. inferior wall MI 10/01 s/p PCI/DES to RCA; b. Myoview 3/16 neg for ischemia; c. LHC 8/16: ostLAD 80%, OM1 70%, OM2 70% x 2 lesions, mRCA 30%, dRCA 70% s/p 4-V CABG 01/24/15 (LIMA-LAD, VG- OM1, VG-OM2, VG-PDA)   . Cancer (HCC)    skin, melanoma  . Carotid artery disease ([place])    a. [place] 8/16: 1-39% bilateral ICA stenosis  . Cataract    removed  . Diastolic dysfunction    a. TTE 8/16: EF 55-60%, no RWMA, Gr1DD, calcified mitral annulus, mild biatrial enlargement  . Erectile dysfunction   . History of  elbow surgery   . HLD (hyperlipidemia)   . HTN (hypertension)   . Inferior myocardial infarction ([place]) 10/01   stent RCA  . Postoperative wound infection 02/02/2015  . Reflux esophagitis   . Sleep apnea 2017   CPAP at night    Surgical History: Past Surgical History:  Procedure Laterality Date  . arm surgery  2010  . CARDIAC CATHETERIZATION  06/24/11  . CARDIAC CATHETERIZATION N/A 01/18/2015   Procedure: Left Heart Cath with coronary angiography;  Surgeon: Minna Merritts, MD;  Location: [facility];  Service: Cardiovascular;  Laterality: N/A;  . CARDIAC CATHETERIZATION N/A 01/18/2015   Procedure: Intravascular Pressure Wire/FFR Study;  Surgeon: [name], MD;  Location: [facility];  Service: Cardiovascular;  Laterality: N/A;  . CAROTID STENT  03/10/2011  . COLONOSCOPY  2010  . CORONARY ARTERY BYPASS GRAFT N/A 01/24/2015   Procedure: CORONARY ARTERY BYPASS GRAFTING x 4 (LIMA-LAD, SVG-Int 1- Int 2, SVG-PD) ENDOSCOPIC GREATER SAPHENOUS VEIN HARVEST LEFT LEG;  Surgeon: [name], MD;  Location: [facility];  Service: Open Heart Surgery;  Laterality: N/A;  . ESOPHAGOGASTRODUODENOSCOPY (EGD) WITH PROPOFOL N/A 04/24/2016   Procedure: ESOPHAGOGASTRODUODENOSCOPY (EGD) WITH PROPOFOL;  Surgeon: Jerene Bears, MD;  [facility];  Service: Gastroenterology;  Laterality: N/A;  . EYE SURGERY     lasik 15 yrs. ago, cataracts  removed - both eyes   . HAMMER TOE SURGERY     right toe  . LEFT HEART CATH AND CORONARY ANGIOGRAPHY Left 06/10/2017   Procedure: LEFT HEART CATH AND CORONARY ANGIOGRAPHY;  Surgeon: Minna Merritts, MD;  Location: [facility];  Service: Cardiovascular;  Laterality: Left;  . NASAL SINUS SURGERY  2008   septpolasty, bilateral turbinate reduction  . SHOULDER ARTHROSCOPY  2012  . TEE WITHOUT CARDIOVERSION N/A 01/24/2015   Procedure: TRANSESOPHAGEAL ECHOCARDIOGRAM (TEE);  Surgeon: [name], MD;  Location: [facility];  Service: Open  Heart Surgery;  Laterality: N/A;  . [place]  . WRIST SURGERY  2011    Home Medications:  Allergies as of 07/16/2017   No Known Allergies     Medication List        Accurate as of 07/16/17  3:24 PM. Always use your most recent med list.          aspirin 81 MG EC tablet Take 81 mg by mouth daily.   Calcium-Vitamin D 600-125 MG-UNIT Tabs Take 1 tablet by mouth daily.   cholecalciferol 1000 units tablet Commonly known as:  VITAMIN D Take 1,000 Units by mouth daily.   ibuprofen 200 MG tablet Commonly known as:  ADVIL,MOTRIN Take 800 mg by mouth every 6 (six) hours as needed for moderate pain.   lisinopril 10 MG tablet Commonly known as:  PRINIVIL,ZESTRIL Take 1 tablet (10 mg total) by mouth daily.   metoprolol succinate 25 MG 24 hr tablet Commonly known as:  TOPROL XL Take 0.5 tablets (12.5 mg total) by mouth daily.   nitroGLYCERIN 0.4 MG SL tablet Commonly known as:  NITROSTAT Place 1 tablet (0.4 mg total) under the tongue every 5 (five) minutes as needed for chest pain.   omeprazole 40 MG capsule Commonly known as:  PRILOSEC Take 1 capsule (40 mg total) by mouth daily.   simvastatin 40 MG tablet Commonly known as:  ZOCOR TAKE ONE TABLET BY MOUTH AT BEDTIME.   traMADol 50 MG tablet Commonly known as:  ULTRAM TAKE 1 TABLET BY MOUTH THREE TIMES DAILYAS NEEDED   vitamin B-12 1000 MCG tablet Commonly known as:  CYANOCOBALAMIN Take 1,000 mcg by mouth daily.   vitamin C 500 MG tablet Commonly known as:  ASCORBIC ACID Take 500 mg by mouth daily.   Zinc 100 MG Tabs Take 1 tablet by mouth daily.       Allergies: No Known Allergies  Family History: Family History  Problem Relation Age of Onset  . Hypertension Mother   . Heart disease Mother   . Hypertension Father   . Diabetes Father   . Heart disease Brother 73  . Cancer Paternal Grandfather   . Colon cancer Neg Hx   . Prostate cancer Neg Hx   . Bladder Cancer Neg Hx   . Kidney cancer Neg  Hx     Social History:  reports that he quit smoking about 17 years ago. His smoking use included cigarettes. He has a 100.00 pack-year smoking history. he has never used smokeless tobacco. He reports that he drinks about 6.0 oz of alcohol per week. He reports that he does not use drugs.  ROS: UROLOGY Frequent Urination?: No Hard to postpone urination?: No Burning/pain with urination?: No Get up at night to urinate?: No Leakage of urine?: No Urine stream starts and stops?: No Trouble starting stream?: No Do you have to strain to urinate?: No Blood in urine?: No Urinary  tract infection?: No Sexually transmitted disease?: No Injury to kidneys or bladder?: No Painful intercourse?: No Weak stream?: No Erection problems?: Yes Penile pain?: No  Gastrointestinal Nausea?: No Vomiting?: No Indigestion/heartburn?: No Diarrhea?: No Constipation?: No  Constitutional Fever: No Night sweats?: No Weight loss?: No Fatigue?: No  Skin Skin rash/lesions?: No Itching?: No  Eyes Blurred vision?: No Double vision?: No  Ears/Nose/Throat Sore throat?: No Sinus problems?: No  Hematologic/Lymphatic Swollen glands?: No Easy bruising?: Yes  Cardiovascular Leg swelling?: No Chest pain?: No  Respiratory Cough?: No Shortness of breath?: No  Endocrine Excessive thirst?: No  Musculoskeletal Back pain?: Yes Joint pain?: No  Neurological Headaches?: No Dizziness?: No  Psychologic Depression?: No Anxiety?: No  Physical Exam: BP 112/68 (BP Location: Right Arm, Patient Position: Sitting, Cuff Size: Normal)   Pulse 70   Ht 5\' 8"  (1.727 m)   Wt 207 lb 8 oz (94.1 kg)   BMI 31.55 kg/m   Constitutional:  Alert and oriented, No acute distress. HEENT: [place] AT, moist mucus membranes.  Trachea midline, no masses. Cardiovascular: No clubbing, cyanosis, or edema. Respiratory: Normal respiratory effort, no increased work of breathing. GI: Abdomen is soft, nontender, nondistended,  no abdominal masses GU: No CVA tenderness.  Skin: No rashes, bruises or suspicious lesions. Lymph: No cervical or inguinal adenopathy. Neurologic: Grossly intact, no focal deficits, moving all 4 extremities. Psychiatric: Normal mood and affect.  Laboratory Data: Lab Results  Component Value Date   WBC 7.1 06/04/2017   HGB 14.9 06/04/2017   HCT 43.5 06/04/2017   MCV 90.9 06/04/2017   PLT 205 06/04/2017    Lab Results  Component Value Date   CREATININE 0.98 06/04/2017    Lab Results  Component Value Date   PSA 0.37 07/14/2016   PSA 0.28 11/24/2014   PSA 0.36 04/05/2013    Lab Results  Component Value Date   TESTOSTERONE 648 11/24/2014    Lab Results  Component Value Date   HGBA1C 5.8 (H) 01/22/2015    Urinalysis    Component Value Date/Time   COLORURINE ORANGE (A) 04/13/2015 1001   APPEARANCEUR CLEAR 04/13/2015 1001   LABSPEC 1.010 04/13/2015 1001   PHURINE 6.0 04/13/2015 1001   GLUCOSEU NEGATIVE 04/13/2015 1001   HGBUR NEGATIVE 04/13/2015 1001   BILIRUBINUR negative 02/25/2017 1312   KETONESUR NEGATIVE 04/13/2015 1001   PROTEINUR negative 02/25/2017 1312   PROTEINUR NEGATIVE 01/22/2015 1023   UROBILINOGEN 0.2 02/25/2017 1312   UROBILINOGEN 0.2 04/13/2015 1001   NITRITE negative 02/25/2017 1312   NITRITE NEGATIVE 04/13/2015 1001   LEUKOCYTESUR Negative 02/25/2017 1312     Assessment & Plan:    1. Erectile dysfunction As discussed with the patient at his previous visit, he has chosen to undergo penile injection therapy for his erectile dysfunction.  This has been approved by his cardiologist.  We discussed the risks and benefits of this as discussed above.  Again he is not a candidate for PDE 5 inhibitors due to his concurrent use of nitrates.  He will follow-up for teaching after obtaining Trimix.   Return for early AM appt for trimix teaching.  Nickie Retort, MD  [facility] [address], [place], [place]  [address] [phone]

## 2017-07-23 ENCOUNTER — Ambulatory Visit (INDEPENDENT_AMBULATORY_CARE_PROVIDER_SITE_OTHER): Payer: PPO | Admitting: Urology

## 2017-07-23 ENCOUNTER — Encounter: Payer: Self-pay | Admitting: Urology

## 2017-07-23 VITALS — BP 123/78 | HR 68 | Ht 68.0 in | Wt 206.0 lb

## 2017-07-23 DIAGNOSIS — N529 Male erectile dysfunction, unspecified: Secondary | ICD-10-CM

## 2017-07-23 DIAGNOSIS — A498 Other bacterial infections of unspecified site: Secondary | ICD-10-CM | POA: Diagnosis not present

## 2017-07-23 DIAGNOSIS — T8140XA Infection following a procedure, unspecified, initial encounter: Secondary | ICD-10-CM | POA: Diagnosis not present

## 2017-07-23 NOTE — Progress Notes (Signed)
07/23/2017 9:23 AM   Randall Ali. 12-16-1940 [id number]  Referring provider: Ria Bush, MD [address] [place], [place] [address]  Chief Complaint  Patient presents with  . Follow-up    HPI: Patient is a 77 year old gentleman with multiple medical comorbidities includingfour-vessel coronary artery disease status post CABG in August 2016and PADwho is currently on nitrates who presents today for follow up of erectile dysfunction. He has triedboth Viagra and Cialis which he found ineffective. He apparently obtainedthese from a pharmacy outside of this country. He is unable to obtain or maintain sufficient erection for intercourse. He notes that he gets about three quarters of its full thickness. He notes occasional morning erections.  He states his cardiac status has been stable for the last 2 yearssince his CABG. He does have a regular cardiologist. Outside of his CABG, he does have a previous history of an MI as well.  Due to the nitrates, the plan at his last visit was to start penile injection therapy.  However, he needed to obtain clearance from his cardiologist prior to this.  He has since seen [name] from cardiology who believes he is at acceptable risk for penile injections.  He now presents today for trimix teaching.          PMH: Past Medical History:  Diagnosis Date  . Allergy    seasonal  . Arthritis    all over- in general   . CAD (coronary artery disease)    a. inferior wall MI 10/01 s/p PCI/DES to RCA; b. Myoview 3/16 neg for ischemia; c. LHC 8/16: ostLAD 80%, OM1 70%, OM2 70% x 2 lesions, mRCA 30%, dRCA 70% s/p 4-V CABG 01/24/15 (LIMA-LAD, VG- OM1, VG-OM2, VG-PDA)   . Cancer (HCC)    skin, melanoma  . Carotid artery disease ([place])    a. [place] 8/16: 1-39% bilateral ICA stenosis  . Cataract    removed  . Diastolic dysfunction    a. TTE 8/16: EF 55-60%, no RWMA, Gr1DD, calcified mitral annulus, mild biatrial enlargement  .  Erectile dysfunction   . History of elbow surgery   . HLD (hyperlipidemia)   . HTN (hypertension)   . Inferior myocardial infarction ([place]) 10/01   stent RCA  . Postoperative wound infection 02/02/2015  . Reflux esophagitis   . Sleep apnea 2017   CPAP at night    Surgical History: Past Surgical History:  Procedure Laterality Date  . arm surgery  2010  . CARDIAC CATHETERIZATION  06/24/11  . CARDIAC CATHETERIZATION N/A 01/18/2015   Procedure: Left Heart Cath with coronary angiography;  Surgeon: Minna Merritts, MD;  Location: [facility];  Service: Cardiovascular;  Laterality: N/A;  . CARDIAC CATHETERIZATION N/A 01/18/2015   Procedure: Intravascular Pressure Wire/FFR Study;  Surgeon: [name], MD;  Location: [facility];  Service: Cardiovascular;  Laterality: N/A;  . CAROTID STENT  03/10/2011  . COLONOSCOPY  2010  . CORONARY ARTERY BYPASS GRAFT N/A 01/24/2015   Procedure: CORONARY ARTERY BYPASS GRAFTING x 4 (LIMA-LAD, SVG-Int 1- Int 2, SVG-PD) ENDOSCOPIC GREATER SAPHENOUS VEIN HARVEST LEFT LEG;  Surgeon: [name], MD;  Location: [facility];  Service: Open Heart Surgery;  Laterality: N/A;  . ESOPHAGOGASTRODUODENOSCOPY (EGD) WITH PROPOFOL N/A 04/24/2016   Procedure: ESOPHAGOGASTRODUODENOSCOPY (EGD) WITH PROPOFOL;  Surgeon: Jerene Bears, MD;  [facility];  Service: Gastroenterology;  Laterality: N/A;  . EYE SURGERY     lasik 15 yrs. ago, cataracts removed -  both eyes   . HAMMER TOE SURGERY     right toe  . LEFT HEART CATH AND CORONARY ANGIOGRAPHY Left 06/10/2017   Procedure: LEFT HEART CATH AND CORONARY ANGIOGRAPHY;  Surgeon: Minna Merritts, MD;  Location: [facility];  Service: Cardiovascular;  Laterality: Left;  . NASAL SINUS SURGERY  2008   septpolasty, bilateral turbinate reduction  . SHOULDER ARTHROSCOPY  2012  . TEE WITHOUT CARDIOVERSION N/A 01/24/2015   Procedure: TRANSESOPHAGEAL ECHOCARDIOGRAM (TEE);  Surgeon: [name], MD;   Location: [facility];  Service: Open Heart Surgery;  Laterality: N/A;  . [place]  . WRIST SURGERY  2011    Home Medications:  Allergies as of 07/23/2017   No Known Allergies     Medication List        Accurate as of 07/23/17  9:23 AM. Always use your most recent med list.          aspirin 81 MG EC tablet Take 81 mg by mouth daily.   Calcium-Vitamin D 600-125 MG-UNIT Tabs Take 1 tablet by mouth daily.   cholecalciferol 1000 units tablet Commonly known as:  VITAMIN D Take 1,000 Units by mouth daily.   ibuprofen 200 MG tablet Commonly known as:  ADVIL,MOTRIN Take 800 mg by mouth every 6 (six) hours as needed for moderate pain.   lisinopril 10 MG tablet Commonly known as:  PRINIVIL,ZESTRIL Take 1 tablet (10 mg total) by mouth daily.   metoprolol succinate 25 MG 24 hr tablet Commonly known as:  TOPROL XL Take 0.5 tablets (12.5 mg total) by mouth daily.   nitroGLYCERIN 0.4 MG SL tablet Commonly known as:  NITROSTAT Place 1 tablet (0.4 mg total) under the tongue every 5 (five) minutes as needed for chest pain.   omeprazole 40 MG capsule Commonly known as:  PRILOSEC Take 1 capsule (40 mg total) by mouth daily.   simvastatin 40 MG tablet Commonly known as:  ZOCOR TAKE ONE TABLET BY MOUTH AT BEDTIME.   traMADol 50 MG tablet Commonly known as:  ULTRAM TAKE 1 TABLET BY MOUTH THREE TIMES DAILYAS NEEDED   vitamin B-12 1000 MCG tablet Commonly known as:  CYANOCOBALAMIN Take 1,000 mcg by mouth daily.   vitamin C 500 MG tablet Commonly known as:  ASCORBIC ACID Take 500 mg by mouth daily.   Zinc 100 MG Tabs Take 1 tablet by mouth daily.       Allergies: No Known Allergies  Family History: Family History  Problem Relation Age of Onset  . Hypertension Mother   . Heart disease Mother   . Hypertension Father   . Diabetes Father   . Heart disease Brother 55  . Cancer Paternal Grandfather   . Colon cancer Neg Hx   . Prostate cancer Neg Hx   . Bladder  Cancer Neg Hx   . Kidney cancer Neg Hx     Social History:  reports that he quit smoking about 17 years ago. His smoking use included cigarettes. He has a 100.00 pack-year smoking history. he has never used smokeless tobacco. He reports that he drinks about 6.0 oz of alcohol per week. He reports that he does not use drugs.  ROS: UROLOGY Frequent Urination?: No Hard to postpone urination?: No Burning/pain with urination?: No Get up at night to urinate?: No Leakage of urine?: No Urine stream starts and stops?: No Trouble starting stream?: No Do you have to strain to urinate?: No Blood in urine?: No Urinary tract infection?:  No Sexually transmitted disease?: No Injury to kidneys or bladder?: No Painful intercourse?: No Weak stream?: No Erection problems?: Yes Penile pain?: No  Gastrointestinal Nausea?: No Vomiting?: No Indigestion/heartburn?: No Diarrhea?: No Constipation?: No  Constitutional Fever: No Night sweats?: No Weight loss?: No Fatigue?: No  Skin Skin rash/lesions?: No Itching?: No  Eyes Blurred vision?: No Double vision?: No  Ears/Nose/Throat Sore throat?: No Sinus problems?: No  Hematologic/Lymphatic Swollen glands?: No Easy bruising?: Yes  Cardiovascular Leg swelling?: No Chest pain?: No  Respiratory Cough?: No Shortness of breath?: No  Endocrine Excessive thirst?: No  Musculoskeletal Back pain?: Yes Joint pain?: No  Neurological Headaches?: No Dizziness?: No  Psychologic Depression?: No Anxiety?: No  Physical Exam: BP 123/78   Pulse 68   Ht 5\' 8"  (1.727 m)   Wt 206 lb (93.4 kg)   BMI 31.32 kg/m   Constitutional:  Alert and oriented, No acute distress. HEENT: [place] AT, moist mucus membranes.  Trachea midline, no masses. Cardiovascular: No clubbing, cyanosis, or edema. Respiratory: Normal respiratory effort, no increased work of breathing. GI: Abdomen is soft, nontender, nondistended, no abdominal masses GU: No CVA  tenderness.  Skin: No rashes, bruises or suspicious lesions. Lymph: No cervical or inguinal adenopathy. Neurologic: Grossly intact, no focal deficits, moving all 4 extremities. Psychiatric: Normal mood and affect.  Laboratory Data: Lab Results  Component Value Date   WBC 7.1 06/04/2017   HGB 14.9 06/04/2017   HCT 43.5 06/04/2017   MCV 90.9 06/04/2017   PLT 205 06/04/2017    Lab Results  Component Value Date   CREATININE 0.98 06/04/2017    Lab Results  Component Value Date   PSA 0.37 07/14/2016   PSA 0.28 11/24/2014   PSA 0.36 04/05/2013    Lab Results  Component Value Date   TESTOSTERONE 648 11/24/2014    Lab Results  Component Value Date   HGBA1C 5.8 (H) 01/22/2015    Urinalysis    Component Value Date/Time   COLORURINE ORANGE (A) 04/13/2015 1001   APPEARANCEUR CLEAR 04/13/2015 1001   LABSPEC 1.010 04/13/2015 1001   PHURINE 6.0 04/13/2015 1001   GLUCOSEU NEGATIVE 04/13/2015 1001   HGBUR NEGATIVE 04/13/2015 1001   BILIRUBINUR negative 02/25/2017 1312   KETONESUR NEGATIVE 04/13/2015 1001   PROTEINUR negative 02/25/2017 1312   PROTEINUR NEGATIVE 01/22/2015 1023   UROBILINOGEN 0.2 02/25/2017 1312   UROBILINOGEN 0.2 04/13/2015 1001   NITRITE negative 02/25/2017 1312   NITRITE NEGATIVE 04/13/2015 1001   LEUKOCYTESUR Negative 02/25/2017 1312    Procedure: [name] was taught the appropriate use and injection procedure for penile injection therapy.  The penis was then prepped at the 3 o'clock position on the left lateral corpora.  0.5 cc of Trimix was then instilled.  The patient obtained an erection at approximately 10 minutes.  Assessment & Plan:   1. Erectile dysfunction The patient will continue to use 0.5 cc of trimix no more frequently than every other day.  He will rotate corpora.  Since he only had a partial erection, he was instructed to increase his dose by 0.1 per session as needed.  He will try his remaining supply.  If this works for him, he  will call for prefilled syringes.  He was instructed not to inject more than 1 cc.  He will follow-up in 3 months to assess his progress.  Return in about 3 months (around 10/20/2017).  Nickie Retort, MD  [facility] [address] [place], [place] [address] [phone]  [phone]  

## 2017-07-31 DIAGNOSIS — M9901 Segmental and somatic dysfunction of cervical region: Secondary | ICD-10-CM | POA: Diagnosis not present

## 2017-07-31 DIAGNOSIS — M62830 Muscle spasm of back: Secondary | ICD-10-CM | POA: Diagnosis not present

## 2017-07-31 DIAGNOSIS — M9902 Segmental and somatic dysfunction of thoracic region: Secondary | ICD-10-CM | POA: Diagnosis not present

## 2017-07-31 DIAGNOSIS — M5033 Other cervical disc degeneration, cervicothoracic region: Secondary | ICD-10-CM | POA: Diagnosis not present

## 2017-08-04 DIAGNOSIS — M9901 Segmental and somatic dysfunction of cervical region: Secondary | ICD-10-CM | POA: Diagnosis not present

## 2017-08-04 DIAGNOSIS — M62830 Muscle spasm of back: Secondary | ICD-10-CM | POA: Diagnosis not present

## 2017-08-04 DIAGNOSIS — M5033 Other cervical disc degeneration, cervicothoracic region: Secondary | ICD-10-CM | POA: Diagnosis not present

## 2017-08-04 DIAGNOSIS — M9902 Segmental and somatic dysfunction of thoracic region: Secondary | ICD-10-CM | POA: Diagnosis not present

## 2017-08-06 DIAGNOSIS — M5033 Other cervical disc degeneration, cervicothoracic region: Secondary | ICD-10-CM | POA: Diagnosis not present

## 2017-08-06 DIAGNOSIS — M9901 Segmental and somatic dysfunction of cervical region: Secondary | ICD-10-CM | POA: Diagnosis not present

## 2017-08-06 DIAGNOSIS — M9902 Segmental and somatic dysfunction of thoracic region: Secondary | ICD-10-CM | POA: Diagnosis not present

## 2017-08-06 DIAGNOSIS — M62830 Muscle spasm of back: Secondary | ICD-10-CM | POA: Diagnosis not present

## 2017-08-20 ENCOUNTER — Other Ambulatory Visit: Payer: Self-pay | Admitting: Family Medicine

## 2017-08-20 DIAGNOSIS — A498 Other bacterial infections of unspecified site: Secondary | ICD-10-CM | POA: Diagnosis not present

## 2017-08-20 DIAGNOSIS — G4733 Obstructive sleep apnea (adult) (pediatric): Secondary | ICD-10-CM | POA: Diagnosis not present

## 2017-08-20 DIAGNOSIS — T8140XA Infection following a procedure, unspecified, initial encounter: Secondary | ICD-10-CM | POA: Diagnosis not present

## 2017-08-20 NOTE — Telephone Encounter (Signed)
Last filled:  07/08/17, #90 Last OV:  05/04/17 Next OV:  none

## 2017-08-22 NOTE — Telephone Encounter (Signed)
Eprescribed.

## 2017-08-23 ENCOUNTER — Other Ambulatory Visit: Payer: Self-pay | Admitting: Family Medicine

## 2017-08-23 DIAGNOSIS — E782 Mixed hyperlipidemia: Secondary | ICD-10-CM

## 2017-08-23 DIAGNOSIS — R739 Hyperglycemia, unspecified: Secondary | ICD-10-CM

## 2017-08-23 DIAGNOSIS — K760 Fatty (change of) liver, not elsewhere classified: Secondary | ICD-10-CM

## 2017-08-23 DIAGNOSIS — Z125 Encounter for screening for malignant neoplasm of prostate: Secondary | ICD-10-CM

## 2017-08-24 ENCOUNTER — Other Ambulatory Visit: Payer: Self-pay | Admitting: Family Medicine

## 2017-08-24 ENCOUNTER — Encounter: Payer: Self-pay | Admitting: Family Medicine

## 2017-08-24 ENCOUNTER — Ambulatory Visit (INDEPENDENT_AMBULATORY_CARE_PROVIDER_SITE_OTHER): Payer: PPO

## 2017-08-24 ENCOUNTER — Ambulatory Visit (INDEPENDENT_AMBULATORY_CARE_PROVIDER_SITE_OTHER): Payer: PPO | Admitting: Family Medicine

## 2017-08-24 VITALS — BP 126/74 | HR 69 | Temp 97.8°F | Ht 68.0 in | Wt 208.5 lb

## 2017-08-24 DIAGNOSIS — Z0000 Encounter for general adult medical examination without abnormal findings: Secondary | ICD-10-CM | POA: Diagnosis not present

## 2017-08-24 DIAGNOSIS — E782 Mixed hyperlipidemia: Secondary | ICD-10-CM

## 2017-08-24 DIAGNOSIS — N400 Enlarged prostate without lower urinary tract symptoms: Secondary | ICD-10-CM | POA: Insufficient documentation

## 2017-08-24 DIAGNOSIS — R079 Chest pain, unspecified: Secondary | ICD-10-CM

## 2017-08-24 DIAGNOSIS — Z125 Encounter for screening for malignant neoplasm of prostate: Secondary | ICD-10-CM

## 2017-08-24 DIAGNOSIS — R739 Hyperglycemia, unspecified: Secondary | ICD-10-CM

## 2017-08-24 DIAGNOSIS — N529 Male erectile dysfunction, unspecified: Secondary | ICD-10-CM

## 2017-08-24 DIAGNOSIS — R3915 Urgency of urination: Secondary | ICD-10-CM | POA: Diagnosis not present

## 2017-08-24 DIAGNOSIS — I25111 Atherosclerotic heart disease of native coronary artery with angina pectoris with documented spasm: Secondary | ICD-10-CM

## 2017-08-24 LAB — COMPREHENSIVE METABOLIC PANEL
ALT: 16 U/L (ref 0–53)
AST: 14 U/L (ref 0–37)
Albumin: 4.3 g/dL (ref 3.5–5.2)
Alkaline Phosphatase: 62 U/L (ref 39–117)
BUN: 22 mg/dL (ref 6–23)
CO2: 28 mEq/L (ref 19–32)
Calcium: 9.8 mg/dL (ref 8.4–10.5)
Chloride: 104 mEq/L (ref 96–112)
Creatinine, Ser: 1.06 mg/dL (ref 0.40–1.50)
GFR: 72.3 mL/min (ref >60.00)
Glucose, Bld: 91 mg/dL (ref 70–99)
Potassium: 4.6 mEq/L (ref 3.5–5.1)
Sodium: 139 mEq/L (ref 135–145)
Total Bilirubin: 0.5 mg/dL (ref 0.2–1.2)
Total Protein: 6.4 g/dL (ref 6.0–8.3)

## 2017-08-24 LAB — LIPID PANEL
Cholesterol: 119 mg/dL (ref 0–200)
HDL: 45.8 mg/dL (ref >39.00)
LDL Cholesterol: 57 mg/dL (ref 0–99)
NonHDL: 72.9
Total CHOL/HDL Ratio: 3
Triglycerides: 82 mg/dL (ref 0.0–149.0)
VLDL: 16.4 mg/dL (ref 0.0–40.0)

## 2017-08-24 LAB — POC URINALSYSI DIPSTICK (AUTOMATED)
Bilirubin, UA: NEGATIVE
Blood, UA: NEGATIVE
Glucose, UA: NEGATIVE
Ketones, UA: NEGATIVE
Leukocytes, UA: NEGATIVE
Nitrite, UA: NEGATIVE
Protein, UA: NEGATIVE
Spec Grav, UA: 1.025 (ref 1.010–1.025)
Urobilinogen, UA: 0.2 E.U./dL
pH, UA: 6 (ref 5.0–8.0)

## 2017-08-24 LAB — HEMOGLOBIN A1C: Hgb A1c MFr Bld: 5.6 % (ref 4.6–6.5)

## 2017-08-24 LAB — PSA, MEDICARE: PSA: 0.39 ng/ml (ref 0.10–4.00)

## 2017-08-24 MED ORDER — TAMSULOSIN HCL 0.4 MG PO CAPS
0.4000 mg | ORAL_CAPSULE | Freq: Every day | ORAL | 3 refills | Status: DC
Start: 2017-08-24 — End: 2018-02-04

## 2017-08-24 NOTE — Progress Notes (Signed)
I reviewed health advisor's note, was available for consultation, and agree with documentation and plan.  

## 2017-08-24 NOTE — Progress Notes (Signed)
BP 126/74 (BP Location: Right Arm, Patient Position: Sitting, Cuff Size: Normal)   Pulse 69   Temp 97.8 F (36.6 C) (Oral)   Ht 5\' 8"  (1.727 m) Comment: no shoes  Wt 208 lb 8 oz (94.6 kg)   SpO2 96%   BMI 31.70 kg/m    CC: discuss urination Subjective:    Patient ID: Randall Kung., male    DOB: Apr 28, 1941, 78 y.o.   MRN: [id number]  HPI: Randall Eppinger. is a 77 y.o. male presenting on 08/24/2017 for Urinary Retention (Stated 2-3 mos ago. Sometimes has trouble getting started then stream seems to slow down.) and 2nd opinion (Pt wants a 2nd opinion on comments made by cardiologist.)   2-3 mo h/o urinary issues including straining, dribbling, weakening of the stream, hesitancy and mild urgency. No dysuria, hematuria or frequency. No significant nocturia. Avoids drinking fluids afternoons/evenings. No known h/o BPH (latest exam stable 07/2016).   ED followed by urology Pilar Jarvis) - on trimix injections. Intermittent success.   Intermittent sharp chest discomfort. Reviewed latest cardiology note. Known CAD s/p CABG. Latest catheterization 06/2017 showed severe 3v disease, occluded SVG to RCA. Treated with plavix, nitrates. Decided against further eval/atherectomy in [place]. Now off plavix.  No pressure/tightness or dyspnea.  He continues to stay active daily.  Relevant past medical, surgical, family and social history reviewed and updated as indicated. Interim medical history since our last visit reviewed. Allergies and medications reviewed and updated. Outpatient Medications Prior to Visit  Medication Sig Dispense Refill  . aspirin 81 MG EC tablet Take 81 mg by mouth daily.      . Calcium Carbonate-Vitamin D (CALCIUM-VITAMIN D) 600-125 MG-UNIT TABS Take 1 tablet by mouth daily.    . cholecalciferol (VITAMIN D) 1000 units tablet Take 1,000 Units by mouth daily.    [name] ibuprofen (ADVIL,MOTRIN) 200 MG tablet Take 800 mg by mouth every 6 (six) hours as needed for moderate pain.    [name]  lisinopril (PRINIVIL,ZESTRIL) 10 MG tablet Take 1 tablet (10 mg total) by mouth daily. 90 tablet 3  . metoprolol succinate (TOPROL XL) 25 MG 24 hr tablet Take 0.5 tablets (12.5 mg total) by mouth daily. 45 tablet 3  . nitroGLYCERIN (NITROSTAT) 0.4 MG SL tablet Place 1 tablet (0.4 mg total) under the tongue every 5 (five) minutes as needed for chest pain. 25 tablet 3  . omeprazole (PRILOSEC) 40 MG capsule Take 1 capsule (40 mg total) by mouth daily. 90 capsule 1  . simvastatin (ZOCOR) 40 MG tablet TAKE ONE TABLET BY MOUTH AT BEDTIME. 90 tablet 3  . traMADol (ULTRAM) 50 MG tablet TAKE ONE TABLET BY MOUTH THREE TIMES A DAY AS NEEDED. 90 tablet 0  . vitamin B-12 (CYANOCOBALAMIN) 1000 MCG tablet Take 1,000 mcg by mouth daily.    . vitamin C (ASCORBIC ACID) 500 MG tablet Take 500 mg by mouth daily.    . Zinc 100 MG TABS Take 1 tablet by mouth daily.       No facility-administered medications prior to visit.      Per HPI unless specifically indicated in ROS section below Review of Systems     Objective:    BP 126/74 (BP Location: Right Arm, Patient Position: Sitting, Cuff Size: Normal)   Pulse 69   Temp 97.8 F (36.6 C) (Oral)   Ht 5\' 8"  (1.727 m) Comment: no shoes  Wt 208 lb 8 oz (94.6 kg)   SpO2 96%   BMI 31.70 kg/m  Wt Readings from Last 3 Encounters:  08/24/17 208 lb 8 oz (94.6 kg)  08/24/17 208 lb 8 oz (94.6 kg)  07/23/17 206 lb (93.4 kg)    Physical Exam  Constitutional: He appears well-developed and well-nourished. No distress.  HENT:  Mouth/Throat: Oropharynx is clear and moist. No oropharyngeal exudate.  Cardiovascular: Normal rate, regular rhythm, normal heart sounds and intact distal pulses.  No murmur heard. Pulmonary/Chest: Effort normal and breath sounds normal. No respiratory distress. He has no wheezes. He has no rales.  Musculoskeletal: He exhibits no edema.  Psychiatric: He has a normal mood and affect.  Nursing note and vitals reviewed.  Results for orders  placed or performed in visit on 08/24/17  POCT Urinalysis Dipstick (Automated)  Result Value Ref Range   Color, UA yellow    Clarity, UA clear    Glucose, UA negative    Bilirubin, UA negative    Ketones, UA negative    Spec Grav, UA 1.025 1.010 - 1.025   Blood, UA negative    pH, UA 6.0 5.0 - 8.0   Protein, UA negative    Urobilinogen, UA 0.2 0.2 or 1.0 E.U./dL   Nitrite, UA negative    Leukocytes, UA Negative Negative      Assessment & Plan:   Problem List Items Addressed This Visit    Chest pain    Today's symptoms sound atypical. Reviewed latest cards eval. Continue current regimen.       Coronary artery disease involving native coronary artery of native heart with angina pectoris with documented spasm Randall Ali)    Undergoing medical management.       Erectile dysfunction    On Trimix through urology      Urinary urgency - Primary    Describes BPH type symptoms (hesitancy, straining, dribbling) without UTI symptoms (no dysuria, frequency or hematuria). Recent DRE without significant evidence of BPH however. UA today clear. Will trial flomax and reassess at follow up visit.       Relevant Orders   POCT Urinalysis Dipstick (Automated) (Completed)       Meds ordered this encounter  Medications  . tamsulosin (FLOMAX) 0.4 MG CAPS capsule    Sig: Take 1 capsule (0.4 mg total) by mouth daily after supper.    Dispense:  30 capsule    Refill:  3   Orders Placed This Encounter  Procedures  . POCT Urinalysis Dipstick (Automated)    Follow up plan: Return if symptoms worsen or fail to improve.  Ria Bush, MD

## 2017-08-24 NOTE — Assessment & Plan Note (Signed)
Today's symptoms sound atypical. Reviewed latest cards eval. Continue current regimen.

## 2017-08-24 NOTE — Progress Notes (Signed)
Subjective:   Randall Ali. is a 77 y.o. male who presents for Medicare Annual/Subsequent preventive examination.  Review of Systems:  N/A Cardiac Risk Factors include: advanced age (>44men, >24 women);dyslipidemia;hypertension;male gender;obesity (BMI >30kg/m2)     Objective:    Vitals: BP 126/74 (BP Location: Right Arm, Patient Position: Sitting, Cuff Size: Normal)   Pulse 69   Temp 97.8 F (36.6 C) (Oral)   Ht 5\' 8"  (1.727 m) Comment: no shoes  Wt 208 lb 8 oz (94.6 kg)   SpO2 96%   BMI 31.70 kg/m   Body mass index is 31.7 kg/m.  Advanced Directives 08/24/2017 06/10/2017 07/14/2016 04/24/2016 04/17/2016 04/02/2016 03/28/2015  Does Patient Have a Medical Advance Directive? No No Yes No;Yes Yes Yes No  Type of Advance Directive - Public librarian;Living will [place];Living will [place];Living will [place];Living will -  Does patient want to make changes to medical advance directive? - - - - - - -  Copy of [place] in Chart? - - No - copy requested No - copy requested No - copy requested - -  Would patient like information on creating a medical advance directive? No - Patient declined No - Patient declined - - - - No - patient declined information  Pre-existing out of facility DNR order (yellow form or pink MOST form) - - - - - - -    Tobacco Social History   Tobacco Use  Smoking Status Former Smoker  . Packs/day: 2.50  . Years: 40.00  . Pack years: 100.00  . Types: Cigarettes  . Last attempt to quit: 03/24/2000  . Years since quitting: 17.4  Smokeless Tobacco Never Used     Counseling given: No   Clinical Intake:  Pre-visit preparation completed: Yes  Pain : 0-10 Pain Score: 5  Pain Type: Chronic pain Pain Location: Back Pain Orientation: Right, Upper, Mid, Lower(right foot) Pain Onset: More than a month ago Pain Frequency: Constant     Nutritional Status:  BMI > 30  Obese Nutritional Risks: None Diabetes: No  How often do you need to have someone help you when you read instructions, pamphlets, or other written materials from your doctor or pharmacy?: 1 - Never What is the last grade level you completed in school?: 12th grade  Interpreter Needed?: No  Comments: pt lives alone Information entered by :: LPinson, LPN  Past Medical History:  Diagnosis Date  . Allergy    seasonal  . Arthritis    all over- in general   . CAD (coronary artery disease)    a. inferior wall MI 10/01 s/p PCI/DES to RCA; b. Myoview 3/16 neg for ischemia; c. LHC 8/16: ostLAD 80%, OM1 70%, OM2 70% x 2 lesions, mRCA 30%, dRCA 70% s/p 4-V CABG 01/24/15 (LIMA-LAD, VG- OM1, VG-OM2, VG-PDA)   . Cancer (HCC)    skin, melanoma  . Carotid artery disease ([place])    a. [place] 8/16: 1-39% bilateral ICA stenosis  . Cataract    removed  . Diastolic dysfunction    a. TTE 8/16: EF 55-60%, no RWMA, Gr1DD, calcified mitral annulus, mild biatrial enlargement  . Erectile dysfunction   . History of elbow surgery   . HLD (hyperlipidemia)   . HTN (hypertension)   . Inferior myocardial infarction ([place]) 10/01   stent RCA  . Postoperative wound infection 02/02/2015  . Reflux esophagitis   . Sleep apnea 2017   CPAP  at night   Past Surgical History:  Procedure Laterality Date  . arm surgery  2010  . CARDIAC CATHETERIZATION  06/24/11  . CARDIAC CATHETERIZATION N/A 01/18/2015   Procedure: Left Heart Cath with coronary angiography;  Surgeon: Minna Merritts, MD;  Location: [facility];  Service: Cardiovascular;  Laterality: N/A;  . CARDIAC CATHETERIZATION N/A 01/18/2015   Procedure: Intravascular Pressure Wire/FFR Study;  Surgeon: [name], MD;  Location: [facility];  Service: Cardiovascular;  Laterality: N/A;  . CAROTID STENT  03/10/2011  . COLONOSCOPY  2010  . CORONARY ARTERY BYPASS GRAFT N/A 01/24/2015   Procedure: CORONARY ARTERY BYPASS GRAFTING x 4 (LIMA-LAD,  SVG-Int 1- Int 2, SVG-PD) ENDOSCOPIC GREATER SAPHENOUS VEIN HARVEST LEFT LEG;  Surgeon: [name], MD;  Location: [facility];  Service: Open Heart Surgery;  Laterality: N/A;  . ESOPHAGOGASTRODUODENOSCOPY (EGD) WITH PROPOFOL N/A 04/24/2016   Procedure: ESOPHAGOGASTRODUODENOSCOPY (EGD) WITH PROPOFOL;  Surgeon: Jerene Bears, MD;  Location: [facility];  Service: Gastroenterology;  Laterality: N/A;  . EYE SURGERY     lasik 15 yrs. ago, cataracts removed - both eyes   . HAMMER TOE SURGERY     right toe  . LEFT HEART CATH AND CORONARY ANGIOGRAPHY Left 06/10/2017   Procedure: LEFT HEART CATH AND CORONARY ANGIOGRAPHY;  Surgeon: Minna Merritts, MD;  Location: [facility];  Service: Cardiovascular;  Laterality: Left;  . NASAL SINUS SURGERY  2008   septpolasty, bilateral turbinate reduction  . SHOULDER ARTHROSCOPY  2012  . TEE WITHOUT CARDIOVERSION N/A 01/24/2015   Procedure: TRANSESOPHAGEAL ECHOCARDIOGRAM (TEE);  Surgeon: [name], MD;  Location: [facility];  Service: Open Heart Surgery;  Laterality: N/A;  . [place]  . WRIST SURGERY  2011   Family History  Problem Relation Age of Onset  . Hypertension Mother   . Heart disease Mother   . Hypertension Father   . Diabetes Father   . Heart disease Brother 94  . Cancer Paternal Grandfather   . Colon cancer Neg Hx   . Prostate cancer Neg Hx   . Bladder Cancer Neg Hx   . Kidney cancer Neg Hx    Social History   Socioeconomic History  . Marital status: Single    Spouse name: None  . Number of children: None  . Years of education: 27  . Highest education level: None  Social Needs  . Financial resource strain: None  . Food insecurity - worry: None  . Food insecurity - inability: None  . Transportation needs - medical: None  . Transportation needs - non-medical: None  Occupational History  . Occupation: retired    Comment: Agricultural consultant  Tobacco Use  . Smoking status: Former Smoker    Packs/day: 2.50    Years:  40.00    Pack years: 100.00    Types: Cigarettes    Last attempt to quit: 03/24/2000    Years since quitting: 17.4  . Smokeless tobacco: Never Used  Substance and Sexual Activity  . Alcohol use: Yes    Alcohol/week: 6.0 oz    Types: 10 Shots of liquor per week  . Drug use: No  . Sexual activity: Yes  Other Topics Concern  . None  Social History Narrative   Singled; lives with son and dog    Occ: retired, back part time at [employer];    Activity: gym 4-5d/wk   Diet: good water, fruits/vegetables daily   Caffeine Use-yes  Outpatient Encounter Medications as of 08/24/2017  Medication Sig  . aspirin 81 MG EC tablet Take 81 mg by mouth daily.    . Calcium Carbonate-Vitamin D (CALCIUM-VITAMIN D) 600-125 MG-UNIT TABS Take 1 tablet by mouth daily.  . cholecalciferol (VITAMIN D) 1000 units tablet Take 1,000 Units by mouth daily.  [name] ibuprofen (ADVIL,MOTRIN) 200 MG tablet Take 800 mg by mouth every 6 (six) hours as needed for moderate pain.  [name] lisinopril (PRINIVIL,ZESTRIL) 10 MG tablet Take 1 tablet (10 mg total) by mouth daily.  . metoprolol succinate (TOPROL XL) 25 MG 24 hr tablet Take 0.5 tablets (12.5 mg total) by mouth daily.  . nitroGLYCERIN (NITROSTAT) 0.4 MG SL tablet Place 1 tablet (0.4 mg total) under the tongue every 5 (five) minutes as needed for chest pain.  [name] omeprazole (PRILOSEC) 40 MG capsule Take 1 capsule (40 mg total) by mouth daily.  . simvastatin (ZOCOR) 40 MG tablet TAKE ONE TABLET BY MOUTH AT BEDTIME.  . traMADol (ULTRAM) 50 MG tablet TAKE ONE TABLET BY MOUTH THREE TIMES A DAY AS NEEDED.  [name] vitamin B-12 (CYANOCOBALAMIN) 1000 MCG tablet Take 1,000 mcg by mouth daily.  . vitamin C (ASCORBIC ACID) 500 MG tablet Take 500 mg by mouth daily.  . Zinc 100 MG TABS Take 1 tablet by mouth daily.     No facility-administered encounter medications on file as of 08/24/2017.     Activities of Daily Living In your present state of health, do you have any difficulty performing  the following activities: 08/24/2017  Hearing? N  Vision? N  Difficulty concentrating or making decisions? N  Walking or climbing stairs? N  Dressing or bathing? N  Doing errands, shopping? N  Preparing Food and eating ? N  Using the Toilet? N  In the past six months, have you accidently leaked urine? N  Do you have problems with loss of bowel control? N  Managing your Medications? N  Managing your Finances? N  Housekeeping or managing your Housekeeping? N  Some recent data might be hidden    Patient Care Team: Ria Bush, MD as PCP - General (Family Medicine) [name], MD (Internal Medicine) Minna Merritts, MD as Consulting Physician (Cardiology)   Assessment:   This is a routine wellness examination for [place].   Hearing Screening   125Hz  250Hz  500Hz  1000Hz  2000Hz  3000Hz  4000Hz  6000Hz  8000Hz   Right ear:   40 0 0  0    Left ear:   0 0 0  0     Exercise Activities and Dietary recommendations Current Exercise Habits: Home exercise routine, Type of exercise: strength training/weights, Time (Minutes): 60, Frequency (Times/Week): 4, Weekly Exercise (Minutes/Week): 240, Intensity: Moderate, Exercise limited by: None identified  Goals    . Increase physical activity     Starting 08/24/2017, I will continue to exercise at gym for 60 minutes 3-4 days per week.        Fall Risk Fall Risk  08/24/2017 07/14/2016 02/06/2016 10/08/2015 05/10/2015  Falls in the past year? No No No No No   Depression Screen PHQ 2/9 Scores 08/24/2017 07/14/2016 02/06/2016 10/08/2015  PHQ - 2 Score 0 0 0 0  PHQ- 9 Score 0 - - -    Cognitive Function MMSE - Mini Mental State Exam 08/24/2017 07/14/2016  Orientation to time 5 5  Orientation to Place 5 5  Registration 3 3  Attention/ Calculation 0 0  Recall 2 2  Recall-comments unable to recall 1 of 3 words  pt was unable to recall 1 of 3 words  Language- name 2 objects 0 0  Language- repeat 1 1  Language- follow 3 step command 3 3  Language-  read & follow direction 0 0  Write a sentence 0 0  Copy design 0 0  Total score 19 19       PLEASE NOTE: A Mini-Cog screen was completed. Maximum score is 20. A value of 0 denotes this part of [place] MMSE was not completed or the patient failed this part of the Mini-Cog screening.   Mini-Cog Screening Orientation to Time - Max 5 pts Orientation to Place - Max 5 pts Registration - Max 3 pts Recall - Max 3 pts Language Repeat - Max 1 pts Language Follow 3 Step Command - Max 3 pts   Immunization History  Administered Date(s) Administered  . Influenza Split 02/27/2012, 04/10/2014  . Influenza, High Dose Seasonal PF 04/05/2013, 02/08/2016  . Influenza,inj,Quad PF,6+ Mos 03/22/2015, 02/25/2017  . Pneumococcal Conjugate-13 04/13/2015  . Pneumococcal Polysaccharide-23 02/26/2005, 02/27/2012  . Tdap 04/18/2015  . Zoster 04/06/2011   Screening Tests Health Maintenance  Topic Date Due  . COLONOSCOPY  08/23/2024  . DTaP/Tdap/Td (2 - Td) 04/17/2025  . TETANUS/TDAP  04/17/2025  . INFLUENZA VACCINE  Completed  . PNA vac Low Risk Adult  Completed      Plan:     I have personally reviewed, addressed, and noted the following in the patient's chart:  A. Medical and social history B. Use of alcohol, tobacco or illicit drugs  C. Current medications and supplements D. Functional ability and status E.  Nutritional status F.  Physical activity G. Advance directives H. List of other physicians I.  Hospitalizations, surgeries, and ER visits in previous 12 months J.  [place] to include hearing, vision, cognitive, depression L. Referrals and appointments - none  In addition, I have reviewed and discussed with patient certain preventive protocols, quality metrics, and best practice recommendations. A written personalized care plan for preventive services as well as general preventive health recommendations were provided to patient.  See attached scanned questionnaire for  additional information.   Signed,   Lindell Noe, MHA, BS, LPN Health Coach

## 2017-08-24 NOTE — Assessment & Plan Note (Addendum)
Describes BPH type symptoms (hesitancy, straining, dribbling) without UTI symptoms (no dysuria, frequency or hematuria). Recent DRE without significant evidence of BPH however. UA today clear. Will trial flomax and reassess at follow up visit.

## 2017-08-24 NOTE — Patient Instructions (Addendum)
Trail flomax 0.4mg  at bedtime for urination and update me next visit with effect.   Heart sounds ok today. We will watch this, let us know if pressure/tightness or other anginal pain (worsening shortness of breath).

## 2017-08-24 NOTE — Assessment & Plan Note (Signed)
On Trimix through urology

## 2017-08-24 NOTE — Assessment & Plan Note (Signed)
Undergoing medical management.

## 2017-08-24 NOTE — Patient Instructions (Signed)
Randall Ali , Thank you for taking time to come for your Medicare Wellness Visit. I appreciate your ongoing commitment to your health goals. Please review the following plan we discussed and let me know if I can assist you in the future.   These are the goals we discussed: Goals    . Increase physical activity     Starting 08/24/2017, I will continue to exercise at gym for 60 minutes 3-4 days per week.        This is a list of the screening recommended for you and due dates:  Health Maintenance  Topic Date Due  . Colon Cancer Screening  08/23/2024  . DTaP/Tdap/Td vaccine (2 - Td) 04/17/2025  . Tetanus Vaccine  04/17/2025  . Flu Shot  Completed  . Pneumonia vaccines  Completed   Preventive Care for Adults  A healthy lifestyle and preventive care can promote health and wellness. Preventive health guidelines for adults include the following key practices.  . A routine yearly physical is a good way to check with your health care provider about your health and preventive screening. It is a chance to share any concerns and updates on your health and to receive a thorough exam.  . Visit your dentist for a routine exam and preventive care every 6 months. Brush your teeth twice a day and floss once a day. Good oral hygiene prevents tooth decay and gum disease.  . The frequency of eye exams is based on your age, health, family medical history, use  of contact lenses, and other factors. Follow your health care provider's recommendations for frequency of eye exams.  . Eat a healthy diet. Foods like vegetables, fruits, whole grains, low-fat dairy products, and lean protein foods contain the nutrients you need without too many calories. Decrease your intake of foods high in solid fats, added sugars, and salt. Eat the right amount of calories for you. Get information about a proper diet from your health care provider, if necessary.  . Regular physical exercise is one of the most important things you  can do for your health. Most adults should get at least 150 minutes of moderate-intensity exercise (any activity that increases your heart rate and causes you to sweat) each week. In addition, most adults need muscle-strengthening exercises on 2 or more days a week.  Silver Sneakers may be a benefit available to you. To determine eligibility, you may visit the website: www.silversneakers.com or contact program at (864)079-3227 Mon-Fri between 8AM-8PM.   . Maintain a healthy weight. The body mass index (BMI) is a screening tool to identify possible weight problems. It provides an estimate of body fat based on height and weight. Your health care provider can find your BMI and can help you achieve or maintain a healthy weight.   For adults 20 years and older: ? A BMI below 18.5 is considered underweight. ? A BMI of 18.5 to 24.9 is normal. ? A BMI of 25 to 29.9 is considered overweight. ? A BMI of 30 and above is considered obese.   . Maintain normal blood lipids and cholesterol levels by exercising and minimizing your intake of saturated fat. Eat a balanced diet with plenty of fruit and vegetables. Blood tests for lipids and cholesterol should begin at age 49 and be repeated every 5 years. If your lipid or cholesterol levels are high, you are over 50, or you are at high risk for heart disease, you may need your cholesterol levels checked more frequently. Ongoing high  lipid and cholesterol levels should be treated with medicines if diet and exercise are not working.  . If you smoke, find out from your health care provider how to quit. If you do not use tobacco, please do not start.  . If you choose to drink alcohol, please do not consume more than 2 drinks per day. One drink is considered to be 12 ounces (355 mL) of beer, 5 ounces (148 mL) of wine, or 1.5 ounces (44 mL) of liquor.  . If you are 60-42 years old, ask your health care provider if you should take aspirin to prevent strokes.  . Use  sunscreen. Apply sunscreen liberally and repeatedly throughout the day. You should seek shade when your shadow is shorter than you. Protect yourself by wearing long sleeves, pants, a wide-brimmed hat, and sunglasses year round, whenever you are outdoors.  . Once a month, do a whole body skin exam, using a mirror to look at the skin on your back. Tell your health care provider of new moles, moles that have irregular borders, moles that are larger than a pencil eraser, or moles that have changed in shape or color.

## 2017-08-24 NOTE — Progress Notes (Signed)
PCP notes:   Health maintenance:  No gaps identified.   Abnormal screenings:   Hearing - failed  Hearing Screening   125Hz  250Hz  500Hz  1000Hz  2000Hz  3000Hz  4000Hz  6000Hz  8000Hz   Right ear:   40 0 0  0    Left ear:   0 0 0  0     Mini-Cog score: 19/20 MMSE - Mini Mental State Exam 08/24/2017 07/14/2016  Orientation to time 5 5  Orientation to Place 5 5  Registration 3 3  Attention/ Calculation 0 0  Recall 2 2  Recall-comments unable to recall 1 of 3 words pt was unable to recall 1 of 3 words  Language- name 2 objects 0 0  Language- repeat 1 1  Language- follow 3 step command 3 3  Language- read & follow direction 0 0  Write a sentence 0 0  Copy design 0 0  Total score 19 19   Patient concerns:   None  Nurse concerns:  None  Next PCP appt:   09/09/17 @ 1130

## 2017-08-25 DIAGNOSIS — D0461 Carcinoma in situ of skin of right upper limb, including shoulder: Secondary | ICD-10-CM | POA: Diagnosis not present

## 2017-08-25 DIAGNOSIS — C44219 Basal cell carcinoma of skin of left ear and external auricular canal: Secondary | ICD-10-CM | POA: Diagnosis not present

## 2017-08-25 DIAGNOSIS — Z85820 Personal history of malignant melanoma of skin: Secondary | ICD-10-CM | POA: Diagnosis not present

## 2017-08-25 DIAGNOSIS — L570 Actinic keratosis: Secondary | ICD-10-CM | POA: Diagnosis not present

## 2017-08-25 DIAGNOSIS — C44519 Basal cell carcinoma of skin of other part of trunk: Secondary | ICD-10-CM | POA: Diagnosis not present

## 2017-08-25 DIAGNOSIS — D0359 Melanoma in situ of other part of trunk: Secondary | ICD-10-CM | POA: Diagnosis not present

## 2017-08-25 DIAGNOSIS — D485 Neoplasm of uncertain behavior of skin: Secondary | ICD-10-CM | POA: Diagnosis not present

## 2017-08-25 DIAGNOSIS — X32XXXA Exposure to sunlight, initial encounter: Secondary | ICD-10-CM | POA: Diagnosis not present

## 2017-08-25 DIAGNOSIS — Z08 Encounter for follow-up examination after completed treatment for malignant neoplasm: Secondary | ICD-10-CM | POA: Diagnosis not present

## 2017-08-25 DIAGNOSIS — Z85828 Personal history of other malignant neoplasm of skin: Secondary | ICD-10-CM | POA: Diagnosis not present

## 2017-08-31 DIAGNOSIS — M9902 Segmental and somatic dysfunction of thoracic region: Secondary | ICD-10-CM | POA: Diagnosis not present

## 2017-08-31 DIAGNOSIS — M9901 Segmental and somatic dysfunction of cervical region: Secondary | ICD-10-CM | POA: Diagnosis not present

## 2017-08-31 DIAGNOSIS — M62830 Muscle spasm of back: Secondary | ICD-10-CM | POA: Diagnosis not present

## 2017-08-31 DIAGNOSIS — M5033 Other cervical disc degeneration, cervicothoracic region: Secondary | ICD-10-CM | POA: Diagnosis not present

## 2017-09-08 DIAGNOSIS — M5033 Other cervical disc degeneration, cervicothoracic region: Secondary | ICD-10-CM | POA: Diagnosis not present

## 2017-09-08 DIAGNOSIS — M9902 Segmental and somatic dysfunction of thoracic region: Secondary | ICD-10-CM | POA: Diagnosis not present

## 2017-09-08 DIAGNOSIS — M9901 Segmental and somatic dysfunction of cervical region: Secondary | ICD-10-CM | POA: Diagnosis not present

## 2017-09-08 DIAGNOSIS — M62830 Muscle spasm of back: Secondary | ICD-10-CM | POA: Diagnosis not present

## 2017-09-09 ENCOUNTER — Ambulatory Visit (INDEPENDENT_AMBULATORY_CARE_PROVIDER_SITE_OTHER): Payer: PPO | Admitting: Family Medicine

## 2017-09-09 ENCOUNTER — Encounter: Payer: Self-pay | Admitting: Family Medicine

## 2017-09-09 VITALS — BP 124/70 | HR 74 | Temp 97.9°F | Ht 68.0 in | Wt 210.0 lb

## 2017-09-09 DIAGNOSIS — Z7189 Other specified counseling: Secondary | ICD-10-CM | POA: Diagnosis not present

## 2017-09-09 DIAGNOSIS — G8929 Other chronic pain: Secondary | ICD-10-CM

## 2017-09-09 DIAGNOSIS — M549 Dorsalgia, unspecified: Secondary | ICD-10-CM

## 2017-09-09 DIAGNOSIS — N3943 Post-void dribbling: Secondary | ICD-10-CM | POA: Diagnosis not present

## 2017-09-09 DIAGNOSIS — M5442 Lumbago with sciatica, left side: Secondary | ICD-10-CM | POA: Diagnosis not present

## 2017-09-09 DIAGNOSIS — I739 Peripheral vascular disease, unspecified: Secondary | ICD-10-CM

## 2017-09-09 DIAGNOSIS — E782 Mixed hyperlipidemia: Secondary | ICD-10-CM | POA: Diagnosis not present

## 2017-09-09 DIAGNOSIS — K760 Fatty (change of) liver, not elsewhere classified: Secondary | ICD-10-CM | POA: Diagnosis not present

## 2017-09-09 DIAGNOSIS — N401 Enlarged prostate with lower urinary tract symptoms: Secondary | ICD-10-CM

## 2017-09-09 DIAGNOSIS — R0989 Other specified symptoms and signs involving the circulatory and respiratory systems: Secondary | ICD-10-CM

## 2017-09-09 DIAGNOSIS — I10 Essential (primary) hypertension: Secondary | ICD-10-CM

## 2017-09-09 DIAGNOSIS — I771 Stricture of artery: Secondary | ICD-10-CM | POA: Insufficient documentation

## 2017-09-09 DIAGNOSIS — Z0000 Encounter for general adult medical examination without abnormal findings: Secondary | ICD-10-CM

## 2017-09-09 NOTE — Assessment & Plan Note (Signed)
LFTs stable.

## 2017-09-09 NOTE — Assessment & Plan Note (Signed)
Chronic, stable. Continue current regimen. 

## 2017-09-09 NOTE — Assessment & Plan Note (Signed)
Chronic, stable. Continue simvastatin. The ASCVD Risk score Randall Bussing DC Jr., et al., 2013) failed to calculate for the following reasons:   The valid total cholesterol range is 130 to 320 mg/dL

## 2017-09-09 NOTE — Progress Notes (Signed)
BP 124/70 (BP Location: Left Arm, Patient Position: Sitting, Cuff Size: Normal)   Pulse 74   Temp 97.9 F (36.6 C) (Oral)   Ht 5\' 8"  (1.727 m)   Wt 210 lb (95.3 kg)   SpO2 96%   BMI 31.93 kg/m    CC: CPE Subjective:    Patient ID: Randall Kung., male    DOB: 1940/06/27, 77 y.o.   MRN: [id number]  HPI: Randall Viens. is a 77 y.o. male presenting on 09/09/2017 for Annual Exam (Pt 2.)   LUTS - previous visit thought BPH related although exam was benign - we started flomax with mild improvement. Nocturia x1-2.   Chronic back pain - regularly takes ibuprofen 600-800mg /day. Also takes tramadol for progressive lumbar stenosis. Sees [facility] at [facility]. Also sees chiropractor. Last ESI was several years ago.   Saw Katha Cabal last month for medicare wellness visit. Note reviewed.  Failed hearing screen. "I need hearing aides" but doesn't like to use them. Upcoming appt with audiology next week.   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 Will screen today.  Lung cancer screening - 17 PY hx, quit 2001, not eligible  Flu shot - yearly  Tdap 2016 Pneumovax 2013, prevnar 2016 zostavax 2012 shingrix - discussed Advanced directive discussion - has not set up. HCPOA would be daughter. Packet provided last year.  Seat belt use discussed Sunscreen use discussed. Sees derm regularly  Ex smoker - 60 PY hx, quit 2001  Alcohol - limited on weekends   Singled; lives with son and dog  Occ: retired, back part time at [employer]  Activity: gym 4-5d/wk Diet: good water, fruits/vegetables daily  Relevant past medical, surgical, family and social history reviewed and updated as indicated. Interim medical history since our last visit reviewed. Allergies and medications reviewed and updated. Outpatient Medications Prior to Visit  Medication Sig Dispense  Refill  . aspirin 81 MG EC tablet Take 81 mg by mouth daily.      . Calcium Carbonate-Vitamin D (CALCIUM-VITAMIN D) 600-125 MG-UNIT TABS Take 1 tablet by mouth daily.    . cholecalciferol (VITAMIN D) 1000 units tablet Take 1,000 Units by mouth daily.    [name] ibuprofen (ADVIL,MOTRIN) 200 MG tablet Take 800 mg by mouth every 6 (six) hours as needed for moderate pain.    [name] lisinopril (PRINIVIL,ZESTRIL) 10 MG tablet Take 1 tablet (10 mg total) by mouth daily. 90 tablet 3  . metoprolol succinate (TOPROL XL) 25 MG 24 hr tablet Take 0.5 tablets (12.5 mg total) by mouth daily. 45 tablet 3  . nitroGLYCERIN (NITROSTAT) 0.4 MG SL tablet Place 1 tablet (0.4 mg total) under the tongue every 5 (five) minutes as needed for chest pain. 25 tablet 3  . omeprazole (PRILOSEC) 40 MG capsule Take 1 capsule (40 mg total) by mouth daily. 90 capsule 1  . simvastatin (ZOCOR) 40 MG tablet TAKE ONE TABLET BY MOUTH AT BEDTIME. 90 tablet 3  . tamsulosin (FLOMAX) 0.4 MG CAPS capsule Take 1 capsule (0.4 mg total) by mouth daily after supper. 30 capsule 3  . traMADol (ULTRAM) 50 MG tablet TAKE ONE TABLET BY MOUTH THREE TIMES A DAY AS NEEDED. 90 tablet 0  . vitamin B-12 (CYANOCOBALAMIN) 1000 MCG tablet Take 1,000 mcg by mouth daily.    . vitamin C (ASCORBIC ACID) 500 MG tablet Take 500 mg by mouth daily.    [name]  Zinc 100 MG TABS Take 1 tablet by mouth daily.       No facility-administered medications prior to visit.      Per HPI unless specifically indicated in ROS section below Review of Systems  Constitutional: Negative for activity change, appetite change, chills, fatigue, fever and unexpected weight change.  HENT: Negative for hearing loss.   Eyes: Negative for visual disturbance.  Respiratory: Negative for cough, chest tightness, shortness of breath and wheezing.   Cardiovascular: Positive for chest pain (intermittent, rare). Negative for palpitations and leg swelling.  Gastrointestinal: Positive for constipation (mild).  Negative for abdominal distention, abdominal pain, blood in stool, diarrhea, nausea and vomiting.       GERD  Genitourinary: Negative for difficulty urinating and hematuria.  Musculoskeletal: Negative for arthralgias, myalgias and neck pain.  Skin: Negative for rash.  Neurological: Negative for dizziness, seizures, syncope and headaches.  Hematological: Negative for adenopathy. Does not bruise/bleed easily.  Psychiatric/Behavioral: Negative for dysphoric mood. The patient is not nervous/anxious.        Objective:    BP 124/70 (BP Location: Left Arm, Patient Position: Sitting, Cuff Size: Normal)   Pulse 74   Temp 97.9 F (36.6 C) (Oral)   Ht 5\' 8"  (1.727 m)   Wt 210 lb (95.3 kg)   SpO2 96%   BMI 31.93 kg/m   Wt Readings from Last 3 Encounters:  09/09/17 210 lb (95.3 kg)  08/24/17 208 lb 8 oz (94.6 kg)  08/24/17 208 lb 8 oz (94.6 kg)    Physical Exam  Constitutional: He is oriented to person, place, and time. He appears well-developed and well-nourished. No distress.  HENT:  Head: Normocephalic and atraumatic.  Right Ear: Hearing, tympanic membrane, external ear and ear canal normal.  Left Ear: Hearing, tympanic membrane, external ear and ear canal normal.  Nose: Nose normal.  Mouth/Throat: Uvula is midline, oropharynx is clear and moist and mucous membranes are normal. No oropharyngeal exudate, posterior oropharyngeal edema or posterior oropharyngeal erythema.  Eyes: Pupils are equal, round, and reactive to light. Conjunctivae and EOM are normal. No scleral icterus.  Neck: Normal range of motion. Neck supple. Carotid bruit is present (L>R). No thyromegaly present.  Cardiovascular: Normal rate, regular rhythm, normal heart sounds and intact distal pulses.  No murmur heard. Pulses:      Radial pulses are 2+ on the right side, and 2+ on the left side.  Pulmonary/Chest: Effort normal and breath sounds normal. No respiratory distress. He has no wheezes. He has no rales.    Abdominal: Soft. Bowel sounds are normal. He exhibits no distension and no mass. There is no tenderness. There is no rebound and no guarding.  Genitourinary: Rectum normal. Rectal exam shows no external hemorrhoid, no internal hemorrhoid, no fissure, no mass, no tenderness and anal tone normal. Prostate is enlarged (25gm). Prostate is not tender.  Musculoskeletal: Normal range of motion. He exhibits no edema.  Lymphadenopathy:    He has no cervical adenopathy.  Neurological: He is alert and oriented to person, place, and time.  CN grossly intact, station and gait intact  Skin: Skin is warm and dry. No rash noted.  Psychiatric: He has a normal mood and affect. His behavior is normal. Judgment and thought content normal.  Nursing note and vitals reviewed.  Results for orders placed or performed in visit on 08/24/17  POCT Urinalysis Dipstick (Automated)  Result Value Ref Range   Color, UA yellow    Clarity, UA clear    Glucose,  UA negative    Bilirubin, UA negative    Ketones, UA negative    Spec Grav, UA 1.025 1.010 - 1.025   Blood, UA negative    pH, UA 6.0 5.0 - 8.0   Protein, UA negative    Urobilinogen, UA 0.2 0.2 or 1.0 E.U./dL   Nitrite, UA negative    Leukocytes, UA Negative Negative      Assessment & Plan:   Problem List Items Addressed This Visit    Advanced care planning/counseling discussion    Advanced directive discussion - has not set up. HCPOA would be daughter. Packet provided last year.       BPH (benign prostatic hyperplasia)    Exam today with evidence of some BPH, pt endorses LUTS - somewhat improved on flomax. Discussed continued flomax use      Carotid bruit    Heard today - check [place].       Relevant Orders   VAS US CAROTID   Chronic back pain    S/p ESI, last saw PM&R 2017. Discussed lower back pain, ibuprofen use and tramadol use. He may consider return to ortho.       Essential hypertension    Chronic, stable. Continue current regimen.        Health maintenance examination - Primary    Preventative protocols reviewed and updated unless pt declined. Discussed healthy diet and lifestyle.       Hepatic steatosis    LFTs stable.       Hyperlipidemia    Chronic, stable. Continue simvastatin. The ASCVD Risk score Mikey Bussing DC Jr., et al., 2013) failed to calculate for the following reasons:   The valid total cholesterol range is 130 to 320 mg/dL       PAD (peripheral artery disease) (HCC)    Chronic. Continue aspirin, statin.           No orders of the defined types were placed in this encounter.  No orders of the defined types were placed in this encounter.   Follow up plan: Return in about 6 months (around 03/11/2018).  Randall Bush, MD

## 2017-09-09 NOTE — Assessment & Plan Note (Signed)
Chronic. Continue aspirin, statin.

## 2017-09-09 NOTE — Assessment & Plan Note (Signed)
Preventative protocols reviewed and updated unless pt declined. Discussed healthy diet and lifestyle.  

## 2017-09-09 NOTE — Assessment & Plan Note (Signed)
Heard today - check [place].

## 2017-09-09 NOTE — Patient Instructions (Addendum)
If interested, check with pharmacy about new 2 shot shingles series (shingrix).  Work on Scientist, physiological.  You are doing well today Continue flomax for now, update me with symptoms off medicine.  We will refer you for carotid ultrasound for neck arteries.   Health Maintenance, Male A healthy lifestyle and preventive care is important for your health and wellness. Ask your health care provider about what schedule of regular examinations is right for you. What should I know about weight and diet? Eat a Healthy Diet  Eat plenty of vegetables, fruits, whole grains, low-fat dairy products, and lean protein.  Do not eat a lot of foods high in solid fats, added sugars, or salt.  Maintain a Healthy Weight Regular exercise can help you achieve or maintain a healthy weight. You should:  Do at least 150 minutes of exercise each week. The exercise should increase your heart rate and make you sweat (moderate-intensity exercise).  Do strength-training exercises at least twice a week.  Watch Your Levels of Cholesterol and Blood Lipids  Have your blood tested for lipids and cholesterol every 5 years starting at 76 years of age. If you are at high risk for heart disease, you should start having your blood tested when you are 76 years old. You may need to have your cholesterol levels checked more often if: ? Your lipid or cholesterol levels are high. ? You are older than 76 years of age. ? You are at high risk for heart disease.  What should I know about cancer screening? Many types of cancers can be detected early and may often be prevented. Lung Cancer  You should be screened every year for lung cancer if: ? You are a current smoker who has smoked for at least 30 years. ? You are a former smoker who has quit within the past 15 years.  Talk to your health care provider about your screening options, when you should start screening, and how often you should be screened.  Colorectal  Cancer  Routine colorectal cancer screening usually begins at 76 years of age and should be repeated every 5-10 years until you are 76 years old. You may need to be screened more often if early forms of precancerous polyps or small growths are found. Your health care provider may recommend screening at an earlier age if you have risk factors for colon cancer.  Your health care provider may recommend using home test kits to check for hidden blood in the stool.  A small camera at the end of a tube can be used to examine your colon (sigmoidoscopy or colonoscopy). This checks for the earliest forms of colorectal cancer.  Prostate and Testicular Cancer  Depending on your age and overall health, your health care provider may do certain tests to screen for prostate and testicular cancer.  Talk to your health care provider about any symptoms or concerns you have about testicular or prostate cancer.  Skin Cancer  Check your skin from head to toe regularly.  Tell your health care provider about any new moles or changes in moles, especially if: ? There is a change in a mole's size, shape, or color. ? You have a mole that is larger than a pencil eraser.  Always use sunscreen. Apply sunscreen liberally and repeat throughout the day.  Protect yourself by wearing long sleeves, pants, a wide-brimmed hat, and sunglasses when outside.  What should I know about heart disease, diabetes, and high blood pressure?  If you are 18-39  years of age, have your blood pressure checked every 3-5 years. If you are 47 years of age or older, have your blood pressure checked every year. You should have your blood pressure measured twice-once when you are at a hospital or clinic, and once when you are not at a hospital or clinic. Record the average of the two measurements. To check your blood pressure when you are not at a hospital or clinic, you can use: ? An automated blood pressure machine at a pharmacy. ? A home blood  pressure monitor.  Talk to your health care provider about your target blood pressure.  If you are between 20-61 years old, ask your health care provider if you should take aspirin to prevent heart disease.  Have regular diabetes screenings by checking your fasting blood sugar level. ? If you are at a normal weight and have a low risk for diabetes, have this test once every three years after the age of 66. ? If you are overweight and have a high risk for diabetes, consider being tested at a younger age or more often.  A one-time screening for abdominal aortic aneurysm (AAA) by ultrasound is recommended for men aged 61-75 years who are current or former smokers. What should I know about preventing infection? Hepatitis B If you have a higher risk for hepatitis B, you should be screened for this virus. Talk with your health care provider to find out if you are at risk for hepatitis B infection. Hepatitis C Blood testing is recommended for:  Everyone born from [birth year] through 1964.  Anyone with known risk factors for hepatitis C.  Sexually Transmitted Diseases (STDs)  You should be screened each year for STDs including gonorrhea and chlamydia if: ? You are sexually active and are younger than 76 years of age. ? You are older than 76 years of age and your health care provider tells you that you are at risk for this type of infection. ? Your sexual activity has changed since you were last screened and you are at an increased risk for chlamydia or gonorrhea. Ask your health care provider if you are at risk.  Talk with your health care provider about whether you are at high risk of being infected with HIV. Your health care provider may recommend a prescription medicine to help prevent HIV infection.  What else can I do?  Schedule regular health, dental, and eye exams.  Stay current with your vaccines (immunizations).  Do not use any tobacco products, such as cigarettes, chewing tobacco, and  e-cigarettes. If you need help quitting, ask your health care provider.  Limit alcohol intake to no more than 2 drinks per day. One drink equals 12 ounces of beer, 5 ounces of wine, or 1 ounces of hard liquor.  Do not use street drugs.  Do not share needles.  Ask your health care provider for help if you need support or information about quitting drugs.  Tell your health care provider if you often feel depressed.  Tell your health care provider if you have ever been abused or do not feel safe at home. This information is not intended to replace advice given to you by your health care provider. Make sure you discuss any questions you have with your health care provider. Document Released: 11/22/2007 Document Revised: 01/23/2016 Document Reviewed: 02/27/2015 Elsevier Interactive Patient Education  [employer].

## 2017-09-09 NOTE — Assessment & Plan Note (Addendum)
Advanced directive discussion - has not set up. HCPOA would be daughter. Packet provided last year.

## 2017-09-09 NOTE — Assessment & Plan Note (Signed)
S/p ESI, last saw PM&R 2017. Discussed lower back pain, ibuprofen use and tramadol use. He may consider return to ortho.

## 2017-09-09 NOTE — Assessment & Plan Note (Signed)
Exam today with evidence of some BPH, pt endorses LUTS - somewhat improved on flomax. Discussed continued flomax use

## 2017-09-10 DIAGNOSIS — M62830 Muscle spasm of back: Secondary | ICD-10-CM | POA: Diagnosis not present

## 2017-09-10 DIAGNOSIS — M5033 Other cervical disc degeneration, cervicothoracic region: Secondary | ICD-10-CM | POA: Diagnosis not present

## 2017-09-10 DIAGNOSIS — M9902 Segmental and somatic dysfunction of thoracic region: Secondary | ICD-10-CM | POA: Diagnosis not present

## 2017-09-10 DIAGNOSIS — M9901 Segmental and somatic dysfunction of cervical region: Secondary | ICD-10-CM | POA: Diagnosis not present

## 2017-09-11 DIAGNOSIS — D0359 Melanoma in situ of other part of trunk: Secondary | ICD-10-CM | POA: Diagnosis not present

## 2017-09-11 DIAGNOSIS — L905 Scar conditions and fibrosis of skin: Secondary | ICD-10-CM | POA: Diagnosis not present

## 2017-09-20 DIAGNOSIS — G4733 Obstructive sleep apnea (adult) (pediatric): Secondary | ICD-10-CM | POA: Diagnosis not present

## 2017-09-20 DIAGNOSIS — A498 Other bacterial infections of unspecified site: Secondary | ICD-10-CM | POA: Diagnosis not present

## 2017-09-20 DIAGNOSIS — T8140XA Infection following a procedure, unspecified, initial encounter: Secondary | ICD-10-CM | POA: Diagnosis not present

## 2017-09-21 ENCOUNTER — Other Ambulatory Visit: Payer: Self-pay | Admitting: Family Medicine

## 2017-09-21 NOTE — Telephone Encounter (Signed)
Last filled:  08/22/17, #90 Last OV (CPE):  09/09/17 Next OV:  03/11/18

## 2017-09-23 DIAGNOSIS — M9902 Segmental and somatic dysfunction of thoracic region: Secondary | ICD-10-CM | POA: Diagnosis not present

## 2017-09-23 DIAGNOSIS — M9901 Segmental and somatic dysfunction of cervical region: Secondary | ICD-10-CM | POA: Diagnosis not present

## 2017-09-23 DIAGNOSIS — M5033 Other cervical disc degeneration, cervicothoracic region: Secondary | ICD-10-CM | POA: Diagnosis not present

## 2017-09-23 DIAGNOSIS — M62830 Muscle spasm of back: Secondary | ICD-10-CM | POA: Diagnosis not present

## 2017-09-23 NOTE — Telephone Encounter (Signed)
Eprescribed.

## 2017-09-28 ENCOUNTER — Ambulatory Visit (INDEPENDENT_AMBULATORY_CARE_PROVIDER_SITE_OTHER): Payer: PPO

## 2017-09-28 DIAGNOSIS — R0989 Other specified symptoms and signs involving the circulatory and respiratory systems: Secondary | ICD-10-CM | POA: Diagnosis not present

## 2017-09-29 DIAGNOSIS — H900 Conductive hearing loss, bilateral: Secondary | ICD-10-CM | POA: Diagnosis not present

## 2017-10-03 ENCOUNTER — Other Ambulatory Visit: Payer: Self-pay | Admitting: Family Medicine

## 2017-10-03 DIAGNOSIS — I771 Stricture of artery: Secondary | ICD-10-CM

## 2017-10-05 ENCOUNTER — Telehealth: Payer: Self-pay | Admitting: Family Medicine

## 2017-10-05 DIAGNOSIS — M9901 Segmental and somatic dysfunction of cervical region: Secondary | ICD-10-CM | POA: Diagnosis not present

## 2017-10-05 DIAGNOSIS — M5033 Other cervical disc degeneration, cervicothoracic region: Secondary | ICD-10-CM | POA: Diagnosis not present

## 2017-10-05 DIAGNOSIS — M62830 Muscle spasm of back: Secondary | ICD-10-CM | POA: Diagnosis not present

## 2017-10-05 DIAGNOSIS — M9902 Segmental and somatic dysfunction of thoracic region: Secondary | ICD-10-CM | POA: Diagnosis not present

## 2017-10-05 NOTE — Telephone Encounter (Signed)
Copied from [place] [id number]. Topic: Quick Communication - Other Results >> Oct 05, 2017  3:73 AM Brenton Grills, CMA wrote: Called patient to inform them of 09/28/17 imaging results. When patient returns call, triage nurse may disclose results. >> Oct 05, 2017 10:59 AM Ahmed Prima L wrote: Please call patient back for imaging results.

## 2017-10-07 DIAGNOSIS — M5033 Other cervical disc degeneration, cervicothoracic region: Secondary | ICD-10-CM | POA: Diagnosis not present

## 2017-10-07 DIAGNOSIS — M9901 Segmental and somatic dysfunction of cervical region: Secondary | ICD-10-CM | POA: Diagnosis not present

## 2017-10-07 DIAGNOSIS — M62830 Muscle spasm of back: Secondary | ICD-10-CM | POA: Diagnosis not present

## 2017-10-07 DIAGNOSIS — M9902 Segmental and somatic dysfunction of thoracic region: Secondary | ICD-10-CM | POA: Diagnosis not present

## 2017-10-08 DIAGNOSIS — H900 Conductive hearing loss, bilateral: Secondary | ICD-10-CM | POA: Diagnosis not present

## 2017-10-09 DIAGNOSIS — L905 Scar conditions and fibrosis of skin: Secondary | ICD-10-CM | POA: Diagnosis not present

## 2017-10-09 DIAGNOSIS — C44319 Basal cell carcinoma of skin of other parts of face: Secondary | ICD-10-CM | POA: Diagnosis not present

## 2017-10-20 DIAGNOSIS — T8140XA Infection following a procedure, unspecified, initial encounter: Secondary | ICD-10-CM | POA: Diagnosis not present

## 2017-10-20 DIAGNOSIS — A498 Other bacterial infections of unspecified site: Secondary | ICD-10-CM | POA: Diagnosis not present

## 2017-10-20 DIAGNOSIS — G4733 Obstructive sleep apnea (adult) (pediatric): Secondary | ICD-10-CM | POA: Diagnosis not present

## 2017-10-23 ENCOUNTER — Other Ambulatory Visit: Payer: Self-pay | Admitting: Family Medicine

## 2017-10-23 DIAGNOSIS — C44622 Squamous cell carcinoma of skin of right upper limb, including shoulder: Secondary | ICD-10-CM | POA: Diagnosis not present

## 2017-10-23 DIAGNOSIS — C44519 Basal cell carcinoma of skin of other part of trunk: Secondary | ICD-10-CM | POA: Diagnosis not present

## 2017-10-23 NOTE — Telephone Encounter (Signed)
Eprescribed.

## 2017-10-23 NOTE — Telephone Encounter (Signed)
Last filled 09/23/17, #90 Last OV (CPE):  09/09/17 Next OV:  03/11/18

## 2017-10-27 DIAGNOSIS — M5033 Other cervical disc degeneration, cervicothoracic region: Secondary | ICD-10-CM | POA: Diagnosis not present

## 2017-10-27 DIAGNOSIS — M62830 Muscle spasm of back: Secondary | ICD-10-CM | POA: Diagnosis not present

## 2017-10-27 DIAGNOSIS — M9902 Segmental and somatic dysfunction of thoracic region: Secondary | ICD-10-CM | POA: Diagnosis not present

## 2017-10-27 DIAGNOSIS — M9901 Segmental and somatic dysfunction of cervical region: Secondary | ICD-10-CM | POA: Diagnosis not present

## 2017-10-30 ENCOUNTER — Ambulatory Visit: Payer: PPO

## 2017-11-04 DIAGNOSIS — M62830 Muscle spasm of back: Secondary | ICD-10-CM | POA: Diagnosis not present

## 2017-11-04 DIAGNOSIS — M9901 Segmental and somatic dysfunction of cervical region: Secondary | ICD-10-CM | POA: Diagnosis not present

## 2017-11-04 DIAGNOSIS — M9902 Segmental and somatic dysfunction of thoracic region: Secondary | ICD-10-CM | POA: Diagnosis not present

## 2017-11-04 DIAGNOSIS — M5033 Other cervical disc degeneration, cervicothoracic region: Secondary | ICD-10-CM | POA: Diagnosis not present

## 2017-11-06 DIAGNOSIS — M62830 Muscle spasm of back: Secondary | ICD-10-CM | POA: Diagnosis not present

## 2017-11-06 DIAGNOSIS — M9901 Segmental and somatic dysfunction of cervical region: Secondary | ICD-10-CM | POA: Diagnosis not present

## 2017-11-06 DIAGNOSIS — M5033 Other cervical disc degeneration, cervicothoracic region: Secondary | ICD-10-CM | POA: Diagnosis not present

## 2017-11-06 DIAGNOSIS — M9902 Segmental and somatic dysfunction of thoracic region: Secondary | ICD-10-CM | POA: Diagnosis not present

## 2017-11-13 ENCOUNTER — Ambulatory Visit: Payer: PPO

## 2017-11-24 ENCOUNTER — Other Ambulatory Visit: Payer: Self-pay | Admitting: Family Medicine

## 2017-11-24 NOTE — Telephone Encounter (Signed)
Tramdol Last filled:  10/23/17, #90 Last OV (CPE):  09/09/17 Next OV:  03/11/18

## 2017-11-25 NOTE — Telephone Encounter (Signed)
E prescrbed

## 2017-12-04 ENCOUNTER — Ambulatory Visit (INDEPENDENT_AMBULATORY_CARE_PROVIDER_SITE_OTHER): Payer: PPO | Admitting: Urology

## 2017-12-04 ENCOUNTER — Encounter: Payer: Self-pay | Admitting: Urology

## 2017-12-04 VITALS — BP 111/67 | HR 69 | Resp 16 | Ht 68.0 in | Wt 209.4 lb

## 2017-12-04 DIAGNOSIS — N5201 Erectile dysfunction due to arterial insufficiency: Secondary | ICD-10-CM | POA: Diagnosis not present

## 2017-12-04 NOTE — Progress Notes (Signed)
12/04/2017 10:35 AM   Randall Ali. [birth year] [id number]  Referring provider: Ria Bush, MD [place], [place] [address]  No chief complaint on file.   HPI:  F/u -   ED - He is unable to obtain or maintain sufficient erection for intercourse. He notes that he gets about three quarters of its full thickness. He notes occasional morning erections. T was 648 in 2016. Associated with multiple medical comorbidities includingfour-vessel coronary artery disease status post CABG in August 2016and PADwho is currently on nitrates. He has triedboth Viagra and Cialis which he found ineffective. He apparently obtainedthese from a pharmacy outside of this country. Due to the nitrates, he was started on penile injection therapy Feb 2019 after clearance from [name] who felt pt is at acceptable risk for penile injections.  Pt returns and does not like injections. [place] and it's hard to travel with it. It worked well. He says he doesn't feel well (malaise) and fatigue. His blood pressure was "80" over something when he walked in  He is concerned about low pressure. BP repeat was 164/78. HR 74.   PMH: Past Medical History:  Diagnosis Date  . Allergy    seasonal  . Arthritis    all over- in general   . CAD (coronary artery disease)    a. inferior wall MI 10/01 s/p PCI/DES to RCA; b. Myoview 3/16 neg for ischemia; c. LHC 8/16: ostLAD 80%, OM1 70%, OM2 70% x 2 lesions, mRCA 30%, dRCA 70% s/p 4-V CABG 01/24/15 (LIMA-LAD, VG- OM1, VG-OM2, VG-PDA)   . Cancer (HCC)    skin, melanoma  . Carotid artery disease ([place])    a. [place] 8/16: 1-39% bilateral ICA stenosis  . Cataract    removed  . Diastolic dysfunction    a. TTE 8/16: EF 55-60%, no RWMA, Gr1DD, calcified mitral annulus, mild biatrial enlargement  . Erectile dysfunction   . History of elbow surgery   . HLD (hyperlipidemia)   . HTN (hypertension)   . Inferior myocardial infarction ([place])  10/01   stent RCA  . Postoperative wound infection 02/02/2015  . Reflux esophagitis   . Sleep apnea 2017   CPAP at night    Surgical History: Past Surgical History:  Procedure Laterality Date  . arm surgery  2010  . CARDIAC CATHETERIZATION  06/24/11  . CARDIAC CATHETERIZATION N/A 01/18/2015   Procedure: Left Heart Cath with coronary angiography;  Surgeon: Minna Merritts, MD;  Location: [facility];  Service: Cardiovascular;  Laterality: N/A;  . CARDIAC CATHETERIZATION N/A 01/18/2015   Procedure: Intravascular Pressure Wire/FFR Study;  Surgeon: [name], MD;  Location: [facility];  Service: Cardiovascular;  Laterality: N/A;  . CAROTID STENT  03/10/2011  . COLONOSCOPY  2010  . CORONARY ARTERY BYPASS GRAFT N/A 01/24/2015   Procedure: CORONARY ARTERY BYPASS GRAFTING x 4 (LIMA-LAD, SVG-Int 1- Int 2, SVG-PD) ENDOSCOPIC GREATER SAPHENOUS VEIN HARVEST LEFT LEG;  Surgeon: [name], MD;  Location: [facility];  Service: Open Heart Surgery;  Laterality: N/A;  . ESOPHAGOGASTRODUODENOSCOPY (EGD) WITH PROPOFOL N/A 04/24/2016   Procedure: ESOPHAGOGASTRODUODENOSCOPY (EGD) WITH PROPOFOL;  Surgeon: Jerene Bears, MD;  Location: [facility];  Service: Gastroenterology;  Laterality: N/A;  . EYE SURGERY     lasik 15 yrs. ago, cataracts removed - both eyes   . HAMMER TOE SURGERY     right toe  . LEFT HEART CATH AND CORONARY ANGIOGRAPHY Left 06/10/2017  Procedure: LEFT HEART CATH AND CORONARY ANGIOGRAPHY;  Surgeon: Minna Merritts, MD;  Location: [facility];  Service: Cardiovascular;  Laterality: Left;  . NASAL SINUS SURGERY  2008   septpolasty, bilateral turbinate reduction  . SHOULDER ARTHROSCOPY  2012  . TEE WITHOUT CARDIOVERSION N/A 01/24/2015   Procedure: TRANSESOPHAGEAL ECHOCARDIOGRAM (TEE);  Surgeon: [name], MD;  Location: [facility];  Service: Open Heart Surgery;  Laterality: N/A;  . [place]  . WRIST SURGERY  2011    Home Medications:    Allergies as of 12/04/2017   No Known Allergies     Medication List        Accurate as of 12/04/17 10:35 AM. Always use your most recent med list.          aspirin 81 MG EC tablet Take 81 mg by mouth daily.   Calcium-Vitamin D 600-125 MG-UNIT Tabs Take 1 tablet by mouth daily.   cholecalciferol 1000 units tablet Commonly known as:  VITAMIN D Take 1,000 Units by mouth daily.   ibuprofen 200 MG tablet Commonly known as:  ADVIL,MOTRIN Take 800 mg by mouth every 6 (six) hours as needed for moderate pain.   lisinopril 10 MG tablet Commonly known as:  PRINIVIL,ZESTRIL Take 1 tablet (10 mg total) by mouth daily.   metoprolol succinate 25 MG 24 hr tablet Commonly known as:  TOPROL XL Take 0.5 tablets (12.5 mg total) by mouth daily.   nitroGLYCERIN 0.4 MG SL tablet Commonly known as:  NITROSTAT Place 1 tablet (0.4 mg total) under the tongue every 5 (five) minutes as needed for chest pain.   omeprazole 40 MG capsule Commonly known as:  PRILOSEC Take 1 capsule (40 mg total) by mouth daily.   simvastatin 40 MG tablet Commonly known as:  ZOCOR TAKE ONE TABLET BY MOUTH AT BEDTIME.   tamsulosin 0.4 MG Caps capsule Commonly known as:  FLOMAX Take 1 capsule (0.4 mg total) by mouth daily after supper.   traMADol 50 MG tablet Commonly known as:  ULTRAM TAKE 1 TABLET BY MOUTH 3 TIMES A DAY AS NEEDED   vitamin B-12 1000 MCG tablet Commonly known as:  CYANOCOBALAMIN Take 1,000 mcg by mouth daily.   vitamin C 500 MG tablet Commonly known as:  ASCORBIC ACID Take 500 mg by mouth daily.   Zinc 100 MG Tabs Take 1 tablet by mouth daily.       Allergies: No Known Allergies  Family History: Family History  Problem Relation Age of Onset  . Hypertension Mother   . Heart disease Mother   . Hypertension Father   . Diabetes Father   . Heart disease Brother 5  . Cancer Paternal Grandfather   . Colon cancer Neg Hx   . Prostate cancer Neg Hx   . Bladder Cancer Neg Hx   .  Kidney cancer Neg Hx     Social History:  reports that he quit smoking about 17 years ago. His smoking use included cigarettes. He has a 100.00 pack-year smoking history. He has never used smokeless tobacco. He reports that he drinks about 6.0 oz of alcohol per week. He reports that he does not use drugs.  ROS:                                        Physical Exam: There were no vitals taken for this visit.  Constitutional:  Alert and oriented, No acute distress. HEENT: [place] AT, moist mucus membranes.  Trachea midline, no masses. Cardiovascular: No clubbing, cyanosis, or edema. Respiratory: Normal respiratory effort, no increased work of breathing. GI: Abdomen is soft, nontender, nondistended, no abdominal masses GU: No CVA tenderness Lymph: No cervical or inguinal lymphadenopathy. Skin: No rashes, bruises or suspicious lesions. Neurologic: Grossly intact, no focal deficits, moving all 4 extremities. Psychiatric: Normal mood and affect.  Laboratory Data: Lab Results  Component Value Date   WBC 7.1 06/04/2017   HGB 14.9 06/04/2017   HCT 43.5 06/04/2017   MCV 90.9 06/04/2017   PLT 205 06/04/2017    Lab Results  Component Value Date   CREATININE 1.06 08/24/2017    Lab Results  Component Value Date   PSA 0.39 08/24/2017   PSA 0.37 07/14/2016   PSA 0.28 11/24/2014    Lab Results  Component Value Date   TESTOSTERONE 648 11/24/2014    Lab Results  Component Value Date   HGBA1C 5.6 08/24/2017    Urinalysis    Component Value Date/Time   COLORURINE ORANGE (A) 04/13/2015 1001   APPEARANCEUR CLEAR 04/13/2015 1001   LABSPEC 1.010 04/13/2015 1001   PHURINE 6.0 04/13/2015 1001   GLUCOSEU NEGATIVE 04/13/2015 1001   HGBUR NEGATIVE 04/13/2015 1001   BILIRUBINUR negative 08/24/2017 1546   KETONESUR NEGATIVE 04/13/2015 1001   PROTEINUR negative 08/24/2017 1546   PROTEINUR NEGATIVE 01/22/2015 1023   UROBILINOGEN 0.2 08/24/2017 1546   UROBILINOGEN  0.2 04/13/2015 1001   NITRITE negative 08/24/2017 1546   NITRITE NEGATIVE 04/13/2015 1001   LEUKOCYTESUR Negative 08/24/2017 1546    No results found for: LABMICR, WBCUA, RBCUA, LABEPIT, MUCUS, BACTERIA   No results found for this or any previous visit. No results found for this or any previous visit. No results found for this or any previous visit. No results found for this or any previous visit. No results found for this or any previous visit. No results found for this or any previous visit. No results found for this or any previous visit. No results found for this or any previous visit.  Assessment & Plan:    ED - discussed other options such as Muse, VED, pde5i and IPP. He will continue injections.   No follow-ups on file.  Festus Aloe, MD  [facility] [address] [place], [place] [address] [phone]

## 2017-12-07 DIAGNOSIS — M9901 Segmental and somatic dysfunction of cervical region: Secondary | ICD-10-CM | POA: Diagnosis not present

## 2017-12-07 DIAGNOSIS — M62830 Muscle spasm of back: Secondary | ICD-10-CM | POA: Diagnosis not present

## 2017-12-07 DIAGNOSIS — M5033 Other cervical disc degeneration, cervicothoracic region: Secondary | ICD-10-CM | POA: Diagnosis not present

## 2017-12-07 DIAGNOSIS — M9902 Segmental and somatic dysfunction of thoracic region: Secondary | ICD-10-CM | POA: Diagnosis not present

## 2017-12-15 ENCOUNTER — Ambulatory Visit (INDEPENDENT_AMBULATORY_CARE_PROVIDER_SITE_OTHER): Payer: PPO | Admitting: Family Medicine

## 2017-12-15 ENCOUNTER — Encounter: Payer: Self-pay | Admitting: Family Medicine

## 2017-12-15 VITALS — BP 118/60 | HR 64 | Temp 97.8°F | Ht 68.0 in | Wt 210.5 lb

## 2017-12-15 DIAGNOSIS — I952 Hypotension due to drugs: Secondary | ICD-10-CM

## 2017-12-15 DIAGNOSIS — I959 Hypotension, unspecified: Secondary | ICD-10-CM | POA: Insufficient documentation

## 2017-12-15 MED ORDER — LISINOPRIL 5 MG PO TABS: 5.0000 mg | ORAL_TABLET | Freq: Every day | ORAL | 1 refills | Status: DC

## 2017-12-15 NOTE — Progress Notes (Signed)
BP 118/60 (BP Location: Left Arm, Patient Position: Sitting, Cuff Size: Large)   Pulse 64   Temp 97.8 F (36.6 C) (Oral)   Ht 5\' 8"  (1.727 m)   Wt 210 lb 8 oz (95.5 kg)   SpO2 96%   BMI 32.01 kg/m    CC: low blood pressure Subjective:    Patient ID: Randall Kung., Ali    DOB: [birth year], 77 y.o.   MRN: [id number]  HPI: Randall Thoma. is a 77 y.o. Ali presenting on 12/15/2017 for Low BP (Seen at [facility] on 12/04/17 BP was 88/40s.  Has been reading low off and on since then. States a lot of days BP is 110s/50s. This morning was 120/73.)   Recent low BP readings over the past month. BP at urologist was 80/40s, improved on recheck. bp ranging 100-110/50s. Compliant with current antihypertensive regimen of lisinopril 10mg  daily, toprol XL 12.5mg  daily. Does check blood pressures at home: see above.  No low blood pressure symptoms of dizziness/syncope. Denies HA, vision changes, CP/tightness, SOB, leg swelling. Otherwise feels well, planning to start playing softball.  Not needing SL nitro.  No new supplements.  No changes in diet.  Taking flomax nightly - unsure if he's noted any difference with this. .  Notes some trouble with dysphagia - planning to return to see Dr Hilarie Fredrickson.   Relevant past medical, surgical, family and social history reviewed and updated as indicated. Interim medical history since our last visit reviewed. Allergies and medications reviewed and updated. Outpatient Medications Prior to Visit  Medication Sig Dispense Refill  . aspirin 81 MG EC tablet Take 81 mg by mouth daily.      . Calcium Carbonate-Vitamin D (CALCIUM-VITAMIN D) 600-125 MG-UNIT TABS Take 1 tablet by mouth daily.    . cholecalciferol (VITAMIN D) 1000 units tablet Take 1,000 Units by mouth daily.    [name] ibuprofen (ADVIL,MOTRIN) 200 MG tablet Take 800 mg by mouth every 6 (six) hours as needed for moderate pain.    . metoprolol succinate (TOPROL XL) 25 MG 24 hr tablet Take 0.5 tablets (12.5  mg total) by mouth daily. 45 tablet 3  . nitroGLYCERIN (NITROSTAT) 0.4 MG SL tablet Place 1 tablet (0.4 mg total) under the tongue every 5 (five) minutes as needed for chest pain. 25 tablet 3  . omeprazole (PRILOSEC) 40 MG capsule Take 1 capsule (40 mg total) by mouth daily. 90 capsule 1  . simvastatin (ZOCOR) 40 MG tablet TAKE ONE TABLET BY MOUTH AT BEDTIME. 90 tablet 3  . tamsulosin (FLOMAX) 0.4 MG CAPS capsule Take 1 capsule (0.4 mg total) by mouth daily after supper. 30 capsule 3  . traMADol (ULTRAM) 50 MG tablet TAKE 1 TABLET BY MOUTH 3 TIMES A DAY AS NEEDED 90 tablet 0  . vitamin B-12 (CYANOCOBALAMIN) 1000 MCG tablet Take 1,000 mcg by mouth daily.    . vitamin C (ASCORBIC ACID) 500 MG tablet Take 500 mg by mouth daily.    . Zinc 100 MG TABS Take 1 tablet by mouth daily.      [name] lisinopril (PRINIVIL,ZESTRIL) 10 MG tablet Take 1 tablet (10 mg total) by mouth daily. 90 tablet 3   No facility-administered medications prior to visit.      Per HPI unless specifically indicated in ROS section below Review of Systems     Objective:    BP 118/60 (BP Location: Left Arm, Patient Position: Sitting, Cuff Size: Large)   Pulse 64   Temp 97.8  F (36.6 C) (Oral)   Ht 5\' 8"  (1.727 m)   Wt 210 lb 8 oz (95.5 kg)   SpO2 96%   BMI 32.01 kg/m   Wt Readings from Last 3 Encounters:  12/15/17 210 lb 8 oz (95.5 kg)  12/04/17 209 lb 6.4 oz (95 kg)  09/09/17 210 lb (95.3 kg)    Physical Exam  Constitutional: He appears well-developed and well-nourished. No distress.  HENT:  Mouth/Throat: Oropharynx is clear and moist. No oropharyngeal exudate.  Cardiovascular: Normal rate, regular rhythm and normal heart sounds.  No murmur heard. Pulmonary/Chest: Effort normal and breath sounds normal. No respiratory distress. He has no wheezes. He has no rales.  Musculoskeletal: He exhibits no edema.  Nursing note and vitals reviewed.     Assessment & Plan:   Problem List Items Addressed This Visit     Hypotension - Primary    Of unclear etiology. Will decrease lisinopril to 5mg  daily, I asked pt to update [place] with effect. Will cc cards as fyi.      Relevant Medications   lisinopril (PRINIVIL,ZESTRIL) 5 MG tablet       Meds ordered this encounter  Medications  . lisinopril (PRINIVIL,ZESTRIL) 5 MG tablet    Sig: Take 1 tablet (5 mg total) by mouth daily.    Dispense:  90 tablet    Refill:  1   No orders of the defined types were placed in this encounter.   Follow up plan: Return if symptoms worsen or fail to improve.  Randall Bush, MD

## 2017-12-15 NOTE — Patient Instructions (Addendum)
Cut lisinopril tablet in half - start taking 5mg  daily. New dose 5mg  will be at pharmacy.  Let us know how you do with lower dose.  I will let Dr [name] know we're dropping the dose.

## 2017-12-15 NOTE — Assessment & Plan Note (Signed)
Of unclear etiology. Will decrease lisinopril to 5mg  daily, I asked pt to update [place] with effect. Will cc cards as fyi.

## 2017-12-17 DIAGNOSIS — M9901 Segmental and somatic dysfunction of cervical region: Secondary | ICD-10-CM | POA: Diagnosis not present

## 2017-12-17 DIAGNOSIS — M9902 Segmental and somatic dysfunction of thoracic region: Secondary | ICD-10-CM | POA: Diagnosis not present

## 2017-12-17 DIAGNOSIS — M62830 Muscle spasm of back: Secondary | ICD-10-CM | POA: Diagnosis not present

## 2017-12-17 DIAGNOSIS — M5033 Other cervical disc degeneration, cervicothoracic region: Secondary | ICD-10-CM | POA: Diagnosis not present

## 2017-12-28 ENCOUNTER — Other Ambulatory Visit: Payer: Self-pay | Admitting: Family Medicine

## 2017-12-28 NOTE — Telephone Encounter (Signed)
Sent. Thanks.   

## 2017-12-28 NOTE — Telephone Encounter (Signed)
Pharmacy requests refills for the following:  Omeprazole - Last refilled #90, 1 refill on 07/14/17  Approved for #90, 0 refills.  Tramadol - #90, 0refills on 11/25/17  Last OV: 09/09/17 Next OV: 03/11/18  Will pend the tramadol for MD approval.

## 2018-01-06 DIAGNOSIS — M9902 Segmental and somatic dysfunction of thoracic region: Secondary | ICD-10-CM | POA: Diagnosis not present

## 2018-01-06 DIAGNOSIS — M62830 Muscle spasm of back: Secondary | ICD-10-CM | POA: Diagnosis not present

## 2018-01-06 DIAGNOSIS — M9901 Segmental and somatic dysfunction of cervical region: Secondary | ICD-10-CM | POA: Diagnosis not present

## 2018-01-06 DIAGNOSIS — M5033 Other cervical disc degeneration, cervicothoracic region: Secondary | ICD-10-CM | POA: Diagnosis not present

## 2018-01-15 DIAGNOSIS — A498 Other bacterial infections of unspecified site: Secondary | ICD-10-CM | POA: Diagnosis not present

## 2018-01-15 DIAGNOSIS — G4733 Obstructive sleep apnea (adult) (pediatric): Secondary | ICD-10-CM | POA: Diagnosis not present

## 2018-01-15 DIAGNOSIS — T8140XA Infection following a procedure, unspecified, initial encounter: Secondary | ICD-10-CM | POA: Diagnosis not present

## 2018-01-18 DIAGNOSIS — M5033 Other cervical disc degeneration, cervicothoracic region: Secondary | ICD-10-CM | POA: Diagnosis not present

## 2018-01-18 DIAGNOSIS — M62830 Muscle spasm of back: Secondary | ICD-10-CM | POA: Diagnosis not present

## 2018-01-18 DIAGNOSIS — M9901 Segmental and somatic dysfunction of cervical region: Secondary | ICD-10-CM | POA: Diagnosis not present

## 2018-01-18 DIAGNOSIS — M9902 Segmental and somatic dysfunction of thoracic region: Secondary | ICD-10-CM | POA: Diagnosis not present

## 2018-01-25 DIAGNOSIS — M9901 Segmental and somatic dysfunction of cervical region: Secondary | ICD-10-CM | POA: Diagnosis not present

## 2018-01-25 DIAGNOSIS — M9902 Segmental and somatic dysfunction of thoracic region: Secondary | ICD-10-CM | POA: Diagnosis not present

## 2018-01-25 DIAGNOSIS — M5033 Other cervical disc degeneration, cervicothoracic region: Secondary | ICD-10-CM | POA: Diagnosis not present

## 2018-01-25 DIAGNOSIS — M62830 Muscle spasm of back: Secondary | ICD-10-CM | POA: Diagnosis not present

## 2018-01-27 DIAGNOSIS — M9901 Segmental and somatic dysfunction of cervical region: Secondary | ICD-10-CM | POA: Diagnosis not present

## 2018-01-27 DIAGNOSIS — M9902 Segmental and somatic dysfunction of thoracic region: Secondary | ICD-10-CM | POA: Diagnosis not present

## 2018-01-27 DIAGNOSIS — M5033 Other cervical disc degeneration, cervicothoracic region: Secondary | ICD-10-CM | POA: Diagnosis not present

## 2018-01-27 DIAGNOSIS — M62830 Muscle spasm of back: Secondary | ICD-10-CM | POA: Diagnosis not present

## 2018-01-29 ENCOUNTER — Other Ambulatory Visit: Payer: Self-pay | Admitting: Family Medicine

## 2018-01-29 NOTE — Telephone Encounter (Signed)
Electronic refill request. Tramadol Last office visit:   12/15/17 Last Filled:    90 tablet 0 12/28/2017  Please advise.

## 2018-01-31 NOTE — Telephone Encounter (Signed)
Eprescribed.

## 2018-02-04 ENCOUNTER — Ambulatory Visit (INDEPENDENT_AMBULATORY_CARE_PROVIDER_SITE_OTHER)
Admission: RE | Admit: 2018-02-04 | Discharge: 2018-02-04 | Disposition: A | Discharge status: Home / Self Care | Payer: PPO | Source: Ambulatory Visit | Attending: Family Medicine | Admitting: Family Medicine

## 2018-02-04 ENCOUNTER — Encounter: Payer: Self-pay | Admitting: Family Medicine

## 2018-02-04 ENCOUNTER — Ambulatory Visit (INDEPENDENT_AMBULATORY_CARE_PROVIDER_SITE_OTHER): Payer: PPO | Admitting: Family Medicine

## 2018-02-04 VITALS — BP 120/64 | HR 66 | Temp 97.8°F | Ht 68.0 in | Wt 215.0 lb

## 2018-02-04 DIAGNOSIS — N401 Enlarged prostate with lower urinary tract symptoms: Secondary | ICD-10-CM

## 2018-02-04 DIAGNOSIS — R1084 Generalized abdominal pain: Secondary | ICD-10-CM | POA: Insufficient documentation

## 2018-02-04 DIAGNOSIS — R195 Other fecal abnormalities: Secondary | ICD-10-CM | POA: Diagnosis not present

## 2018-02-04 DIAGNOSIS — N3943 Post-void dribbling: Secondary | ICD-10-CM

## 2018-02-04 DIAGNOSIS — Z8719 Personal history of other diseases of the digestive system: Secondary | ICD-10-CM | POA: Diagnosis not present

## 2018-02-04 DIAGNOSIS — I25111 Atherosclerotic heart disease of native coronary artery with angina pectoris with documented spasm: Secondary | ICD-10-CM | POA: Diagnosis not present

## 2018-02-04 LAB — COMPREHENSIVE METABOLIC PANEL
ALT: 17 U/L (ref 0–53)
AST: 16 U/L (ref 0–37)
Albumin: 4.4 g/dL (ref 3.5–5.2)
Alkaline Phosphatase: 67 U/L (ref 39–117)
BUN: 20 mg/dL (ref 6–23)
CO2: 29 mEq/L (ref 19–32)
Calcium: 9.5 mg/dL (ref 8.4–10.5)
Chloride: 105 mEq/L (ref 96–112)
Creatinine, Ser: 1.12 mg/dL (ref 0.40–1.50)
GFR: 67.76 mL/min (ref >60.00)
Glucose, Bld: 67 mg/dL — ABNORMAL LOW (ref 70–99)
Potassium: 4.5 mEq/L (ref 3.5–5.1)
Sodium: 139 mEq/L (ref 135–145)
Total Bilirubin: 0.6 mg/dL (ref 0.2–1.2)
Total Protein: 6.9 g/dL (ref 6.0–8.3)

## 2018-02-04 LAB — CBC WITH DIFFERENTIAL/PLATELET
Basophils Absolute: 0 10*3/uL (ref 0.0–0.1)
Basophils Relative: 0.6 % (ref 0.0–3.0)
Eosinophils Absolute: 0.1 10*3/uL (ref 0.0–0.7)
Eosinophils Relative: 1.4 % (ref 0.0–5.0)
HCT: 44.4 % (ref 39.0–52.0)
Hemoglobin: 14.9 g/dL (ref 13.0–17.0)
Lymphocytes Relative: 23.3 % (ref 12.0–46.0)
Lymphs Abs: 1.5 10*3/uL (ref 0.7–4.0)
MCHC: 33.7 g/dL (ref 30.0–36.0)
MCV: 91.4 fl (ref 78.0–100.0)
Monocytes Absolute: 0.7 10*3/uL (ref 0.1–1.0)
Monocytes Relative: 11.1 % (ref 3.0–12.0)
Neutro Abs: 4.2 10*3/uL (ref 1.4–7.7)
Neutrophils Relative %: 63.6 % (ref 43.0–77.0)
Platelets: 209 10*3/uL (ref 150.0–400.0)
RBC: 4.86 Mil/uL (ref 4.22–5.81)
RDW: 13.3 % (ref 11.5–15.5)
WBC: 6.5 10*3/uL (ref 4.0–10.5)

## 2018-02-04 LAB — LIPASE: Lipase: 48 U/L (ref 11.0–59.0)

## 2018-02-04 MED ORDER — TAMSULOSIN HCL 0.4 MG PO CAPS: 0.4000 mg | ORAL_CAPSULE | Freq: Every day | ORAL | 6 refills | Status: DC

## 2018-02-04 NOTE — Patient Instructions (Addendum)
Labs today Xray today. Start miralax 1 capful daily in 8 oz of water.  If no better, let me know for abdominal ultrasound.  Continue omeprazole daily and add zantac 150mg  at night time. Take gas x for symptoms

## 2018-02-04 NOTE — Assessment & Plan Note (Signed)
He would like to restart flomax - felt it was effective.

## 2018-02-04 NOTE — Progress Notes (Addendum)
BP 120/64 (BP Location: Left Arm, Patient Position: Sitting, Cuff Size: Large)   Pulse 66   Temp 97.8 F (36.6 C) (Oral)   Ht 5\' 8"  (1.727 m)   Wt 215 lb (97.5 kg)   SpO2 95%   BMI 32.69 kg/m    CC: abd pain Subjective:    Patient ID: Randall Ali., male    DOB: 02-Oct-1940, 77 y.o.   MRN: [id number]  HPI: [name] is a 77 y.o. male presenting on 02/04/2018 for Abdominal Pain (C/o mid abd pain and bloating. States he feels like he is going to explode. Sxs started 2-3 wks ago. )   2-3 wk h/o upper abd discomfort and ache, abdomen feels hard, bloating and gassiness. Ongoing mild dysphagia and constipation. No known triggers or alleviating factors. Hasn't tried anything for this yet - has not tried nitroglycerine for this. Takes MOM to have BM due to chronic constipation. Passing gas well. He has been drinking metamucil twice daily.   Denies fevers/chills, nausea/vomiting, diarrhea. No blood in stool. No unexpected weight loss. No boring pain to back  Denies significant GERD - this is actually fully better.  Takes ibuprofen intermittently.  A few alcoholic drinks a week.  1 cup coffee daily.   Has been off flomax - wants to restart for BPH.   Colon cancer screening -colonoscopy 06/2014 melanosis, fair prep consider cologuard 5 yrs, rpt 10 yrs ([name])  Known CAD s/p CABG.  Known esophagitis on prior EGD.   Relevant past medical, surgical, family and social history reviewed and updated as indicated. Interim medical history since our last visit reviewed. Allergies and medications reviewed and updated. Outpatient Medications Prior to Visit  Medication Sig Dispense Refill  . aspirin 81 MG EC tablet Take 81 mg by mouth daily.      . Calcium Carbonate-Vitamin D (CALCIUM-VITAMIN D) 600-125 MG-UNIT TABS Take 1 tablet by mouth daily.    . cholecalciferol (VITAMIN D) 1000 units tablet Take 1,000 Units by mouth daily.    [name] ibuprofen (ADVIL,MOTRIN) 200 MG tablet Take  800 mg by mouth every 6 (six) hours as needed for moderate pain.    [name] lisinopril (PRINIVIL,ZESTRIL) 5 MG tablet Take 1 tablet (5 mg total) by mouth daily. 90 tablet 1  . metoprolol succinate (TOPROL XL) 25 MG 24 hr tablet Take 0.5 tablets (12.5 mg total) by mouth daily. 45 tablet 3  . nitroGLYCERIN (NITROSTAT) 0.4 MG SL tablet Place 1 tablet (0.4 mg total) under the tongue every 5 (five) minutes as needed for chest pain. 25 tablet 3  . omeprazole (PRILOSEC) 40 MG capsule TAKE 1 CAPSULE BY MOUTH DAILY 90 capsule 0  . simvastatin (ZOCOR) 40 MG tablet TAKE ONE TABLET BY MOUTH AT BEDTIME. 90 tablet 3  . traMADol (ULTRAM) 50 MG tablet TAKE 1 TABLET BY MOUTH 3 TIMES A DAY AS NEEDED 90 tablet 0  . vitamin B-12 (CYANOCOBALAMIN) 1000 MCG tablet Take 1,000 mcg by mouth daily.    . vitamin C (ASCORBIC ACID) 500 MG tablet Take 500 mg by mouth daily.    . Zinc 100 MG TABS Take 1 tablet by mouth daily.      . tamsulosin (FLOMAX) 0.4 MG CAPS capsule Take 1 capsule (0.4 mg total) by mouth daily after supper. 30 capsule 3   No facility-administered medications prior to visit.      Per HPI unless specifically indicated in ROS section below Review of Systems     Objective:  BP 120/64 (BP Location: Left Arm, Patient Position: Sitting, Cuff Size: Large)   Pulse 66   Temp 97.8 F (36.6 C) (Oral)   Ht 5\' 8"  (1.727 m)   Wt 215 lb (97.5 kg)   SpO2 95%   BMI 32.69 kg/m   Wt Readings from Last 3 Encounters:  02/04/18 215 lb (97.5 kg)  12/15/17 210 lb 8 oz (95.5 kg)  12/04/17 209 lb 6.4 oz (95 kg)    Physical Exam  Constitutional: He appears well-developed and well-nourished. No distress.  HENT:  Mouth/Throat: Oropharynx is clear and moist. No oropharyngeal exudate.  Cardiovascular: Normal rate, regular rhythm and normal heart sounds.  No murmur heard. Pulmonary/Chest: Effort normal and breath sounds normal. No respiratory distress. He has no wheezes. He has no rales.  Abdominal: Soft. He exhibits  distension. He exhibits no mass. Bowel sounds are increased. There is no hepatosplenomegaly. There is generalized tenderness (mild). There is no rebound and no guarding. No hernia.  Musculoskeletal: He exhibits no edema.  Skin: Skin is warm and dry. No rash noted.  Psychiatric: He has a normal mood and affect.  Nursing note and vitals reviewed.  Results for orders placed or performed in visit on 08/24/17  POCT Urinalysis Dipstick (Automated)  Result Value Ref Range   Color, UA yellow    Clarity, UA clear    Glucose, UA negative    Bilirubin, UA negative    Ketones, UA negative    Spec Grav, UA 1.025 1.010 - 1.025   Blood, UA negative    pH, UA 6.0 5.0 - 8.0   Protein, UA negative    Urobilinogen, UA 0.2 0.2 or 1.0 E.U./dL   Nitrite, UA negative    Leukocytes, UA Negative Negative      Assessment & Plan:   Problem List Items Addressed This Visit    History of esophagitis   Generalized abdominal pain - Primary    Overall benign exam today.  Possible gastritis in h/o esophagitis and PUD. Ddx includes pancreatitis, constipation, gallbladder disease, cardiac disease.  rec gas X, add zantac to daily PPI. Discussed trying SL nitro next time he has discomfort. If chest pain or responsive to nitro, advised f/u with cards.  For possible constipation not improved with metamucil - rec daily miralax, hold for diarrhea. He has this at home.  Check labs today, xray eval stool burden, r/o obstruction.       Relevant Orders   DG Abd 2 Views   CBC with Differential/Platelet   Comprehensive metabolic panel   Lipase   Coronary artery disease involving native coronary artery of native heart with angina pectoris with documented spasm (HCC)   BPH (benign prostatic hyperplasia)    He would like to restart flomax - felt it was effective.      Relevant Medications   tamsulosin (FLOMAX) 0.4 MG CAPS capsule       Meds ordered this encounter  Medications  . tamsulosin (FLOMAX) 0.4 MG CAPS  capsule    Sig: Take 1 capsule (0.4 mg total) by mouth daily after supper.    Dispense:  30 capsule    Refill:  6   Orders Placed This Encounter  Procedures  . DG Abd 2 Views    Standing Status:   Future    Number of Occurrences:   1    Standing Expiration Date:   04/07/2019    Order Specific Question:   Reason for Exam (SYMPTOM  OR DIAGNOSIS REQUIRED)  Answer:   abd pain    Order Specific Question:   Preferred imaging location?    Answer:   [facility]    Order Specific Question:   Radiology Contrast Protocol - do NOT remove file path    Answer:   [email or web address]  . CBC with Differential/Platelet  . Comprehensive metabolic panel  . Lipase    Follow up plan: No follow-ups on file.  Ria Bush, MD

## 2018-02-04 NOTE — Assessment & Plan Note (Addendum)
Overall benign exam today.  Possible gastritis in h/o esophagitis and PUD. Ddx includes pancreatitis, constipation, gallbladder disease, cardiac disease.  rec gas X, add zantac to daily PPI. Discussed trying SL nitro next time he has discomfort. If chest pain or responsive to nitro, advised f/u with cards.  For possible constipation not improved with metamucil - rec daily miralax, hold for diarrhea. He has this at home.  Check labs today, xray eval stool burden, r/o obstruction.

## 2018-02-09 DIAGNOSIS — D485 Neoplasm of uncertain behavior of skin: Secondary | ICD-10-CM | POA: Diagnosis not present

## 2018-02-09 DIAGNOSIS — M9902 Segmental and somatic dysfunction of thoracic region: Secondary | ICD-10-CM | POA: Diagnosis not present

## 2018-02-09 DIAGNOSIS — C44719 Basal cell carcinoma of skin of left lower limb, including hip: Secondary | ICD-10-CM | POA: Diagnosis not present

## 2018-02-09 DIAGNOSIS — L570 Actinic keratosis: Secondary | ICD-10-CM | POA: Diagnosis not present

## 2018-02-09 DIAGNOSIS — Z85820 Personal history of malignant melanoma of skin: Secondary | ICD-10-CM | POA: Diagnosis not present

## 2018-02-09 DIAGNOSIS — M5033 Other cervical disc degeneration, cervicothoracic region: Secondary | ICD-10-CM | POA: Diagnosis not present

## 2018-02-09 DIAGNOSIS — D0462 Carcinoma in situ of skin of left upper limb, including shoulder: Secondary | ICD-10-CM | POA: Diagnosis not present

## 2018-02-09 DIAGNOSIS — M9901 Segmental and somatic dysfunction of cervical region: Secondary | ICD-10-CM | POA: Diagnosis not present

## 2018-02-09 DIAGNOSIS — D0472 Carcinoma in situ of skin of left lower limb, including hip: Secondary | ICD-10-CM | POA: Diagnosis not present

## 2018-02-09 DIAGNOSIS — M62830 Muscle spasm of back: Secondary | ICD-10-CM | POA: Diagnosis not present

## 2018-02-09 DIAGNOSIS — Z08 Encounter for follow-up examination after completed treatment for malignant neoplasm: Secondary | ICD-10-CM | POA: Diagnosis not present

## 2018-02-09 DIAGNOSIS — Z85828 Personal history of other malignant neoplasm of skin: Secondary | ICD-10-CM | POA: Diagnosis not present

## 2018-02-11 DIAGNOSIS — M9902 Segmental and somatic dysfunction of thoracic region: Secondary | ICD-10-CM | POA: Diagnosis not present

## 2018-02-11 DIAGNOSIS — M62830 Muscle spasm of back: Secondary | ICD-10-CM | POA: Diagnosis not present

## 2018-02-11 DIAGNOSIS — M9901 Segmental and somatic dysfunction of cervical region: Secondary | ICD-10-CM | POA: Diagnosis not present

## 2018-02-11 DIAGNOSIS — M5033 Other cervical disc degeneration, cervicothoracic region: Secondary | ICD-10-CM | POA: Diagnosis not present

## 2018-02-13 DIAGNOSIS — M62830 Muscle spasm of back: Secondary | ICD-10-CM | POA: Diagnosis not present

## 2018-02-13 DIAGNOSIS — M9902 Segmental and somatic dysfunction of thoracic region: Secondary | ICD-10-CM | POA: Diagnosis not present

## 2018-02-13 DIAGNOSIS — M5033 Other cervical disc degeneration, cervicothoracic region: Secondary | ICD-10-CM | POA: Diagnosis not present

## 2018-02-13 DIAGNOSIS — M9901 Segmental and somatic dysfunction of cervical region: Secondary | ICD-10-CM | POA: Diagnosis not present

## 2018-02-18 DIAGNOSIS — M5033 Other cervical disc degeneration, cervicothoracic region: Secondary | ICD-10-CM | POA: Diagnosis not present

## 2018-02-18 DIAGNOSIS — M9902 Segmental and somatic dysfunction of thoracic region: Secondary | ICD-10-CM | POA: Diagnosis not present

## 2018-02-18 DIAGNOSIS — M9901 Segmental and somatic dysfunction of cervical region: Secondary | ICD-10-CM | POA: Diagnosis not present

## 2018-02-18 DIAGNOSIS — M62830 Muscle spasm of back: Secondary | ICD-10-CM | POA: Diagnosis not present

## 2018-02-19 DIAGNOSIS — M48061 Spinal stenosis, lumbar region without neurogenic claudication: Secondary | ICD-10-CM | POA: Diagnosis not present

## 2018-02-19 DIAGNOSIS — M545 Low back pain: Secondary | ICD-10-CM | POA: Diagnosis not present

## 2018-02-19 DIAGNOSIS — M5136 Other intervertebral disc degeneration, lumbar region: Secondary | ICD-10-CM | POA: Diagnosis not present

## 2018-02-23 ENCOUNTER — Telehealth: Payer: Self-pay | Admitting: Cardiovascular Disease

## 2018-02-23 NOTE — Telephone Encounter (Signed)
Pt c/o of Chest Pain: STAT if CP now or developed within 24 hours  1. Are you having CP right now? No   2. Are you experiencing any other symptoms (ex. SOB, nausea, vomiting, sweating)? No only arm hurt   3. How long have you been experiencing CP? Only saturday night   4. Is your CP continuous or coming and going? Constant   5. Have you taken Nitroglycerin? Yes   It started Saturday night, he took two nitro and it went away.  He's had not problems since Saturday    ?

## 2018-02-23 NOTE — Telephone Encounter (Signed)
I spoke with the patient. He states that Saturday evening, he started to have left arm pain, increased sob, and some mild diaphoresis. No real chest pain noted. He took 2 NTG, then waited about several minutes and took 2 more NTG (he only took 1 pill at a time).  He did have relief of his symptoms with NTG. However, he feels like when he sits, his left arm "goes to sleep." This has been going on prior to Saturday.  He states he has an MI in 2001 and he did have the similar left arm pain that he had on Saturday, but he did not have chest tightness this time as he did with his MI.   He does complain of some dizziness, but he thinks this may be related to issues with his back.  He has checked his BP at home and his SBP is running ~120.   The patient confirms his PCP did decrease the dose of his lisinopril recently due to low blood pressure.   He denies any pain at this time, but statess "I just don't feel good most of the time."  I advised the patient I would forward to [name] to review and call him back with recommendations. He voices understanding and is agreeable. He has also been advised to report to the ER for further evaluation if he is requiring up to 3 NTG with some relief of his symptoms.   Last cardiac cath was 06/10/17: "Recommendations:  New occluded RCA Discussed with him in detail Will try medical management first Add plavix, nitrates If he requests intervention, will have to be performed in [place] with atherectomy  [name] 06/10/2017, 9:59 AM"

## 2018-02-25 NOTE — Telephone Encounter (Signed)
Sx sound somewhat atypical  "when he sits, his left arm "goes to sleep." Has he talked with PMD about his back/neck and arm? heavy activities using the arm or straining the neck?

## 2018-02-26 NOTE — Telephone Encounter (Signed)
I called and spoke with the patient. He denies any chest pain since we last spoke. No arm pain either, but he still feels like his arm is "going to sleep." He denies any strenous activity recently. I inquired if he spoke with his PCP about his symptoms.  He states he did speak with his PCP and he advised if any ongoing symptoms to touch base with [name].  I advised the patient that his symptoms do not sound Urgent/ Emergent at this time, but did offer to schedule him an appointment to be seen for follow up. The patient declined at this time and said he would "just watch it and see what happens." I have advised him if he is using more frequent NTG with relief of any symptoms, to please call us back for a follow up appointment.  The patient voices understanding and is agreeable.

## 2018-03-01 ENCOUNTER — Other Ambulatory Visit: Payer: Self-pay | Admitting: Family Medicine

## 2018-03-01 NOTE — Telephone Encounter (Signed)
Name of Medication: Tramadol Name of Pharmacy: [facility] or Written Date and Quantity: 03/01/18, #90 Last Office Visit and Type: 02/04/18, acute Next Office Visit and Type: 03/11/18, f/u Last Controlled Substance Agreement Date: none Last UDS: 01/18/16

## 2018-03-03 NOTE — Telephone Encounter (Signed)
Eprescribed.

## 2018-03-11 ENCOUNTER — Ambulatory Visit (INDEPENDENT_AMBULATORY_CARE_PROVIDER_SITE_OTHER): Payer: PPO | Admitting: Family Medicine

## 2018-03-11 ENCOUNTER — Encounter: Payer: Self-pay | Admitting: Family Medicine

## 2018-03-11 VITALS — BP 118/66 | HR 66 | Temp 97.6°F | Ht 68.0 in | Wt 211.5 lb

## 2018-03-11 DIAGNOSIS — M5442 Lumbago with sciatica, left side: Secondary | ICD-10-CM | POA: Diagnosis not present

## 2018-03-11 DIAGNOSIS — Z23 Encounter for immunization: Secondary | ICD-10-CM

## 2018-03-11 DIAGNOSIS — I480 Paroxysmal atrial fibrillation: Secondary | ICD-10-CM | POA: Diagnosis not present

## 2018-03-11 DIAGNOSIS — I10 Essential (primary) hypertension: Secondary | ICD-10-CM | POA: Diagnosis not present

## 2018-03-11 DIAGNOSIS — G8929 Other chronic pain: Secondary | ICD-10-CM | POA: Diagnosis not present

## 2018-03-11 DIAGNOSIS — R1084 Generalized abdominal pain: Secondary | ICD-10-CM

## 2018-03-11 DIAGNOSIS — I25111 Atherosclerotic heart disease of native coronary artery with angina pectoris with documented spasm: Secondary | ICD-10-CM | POA: Diagnosis not present

## 2018-03-11 DIAGNOSIS — Z951 Presence of aortocoronary bypass graft: Secondary | ICD-10-CM

## 2018-03-11 NOTE — Assessment & Plan Note (Signed)
Sounds regular today.  ?

## 2018-03-11 NOTE — Assessment & Plan Note (Signed)
Chronic, stable. Continue current regimen. 

## 2018-03-11 NOTE — Assessment & Plan Note (Signed)
Ongoing confusion about cardiac history - he states he has a vein that was not "hooked up" and was told there was no benefit of hooking it up. Reviewing latest cardiac cath, there seemed to be new RCA occlusion - and plan was medical management to avoid atherectomy in [place]. I asked him to clarify cards recs with cardiology next visit.

## 2018-03-11 NOTE — Assessment & Plan Note (Signed)
This is improving with better constipation control. Advised as he is on narcotic regimen needs daily bowel regimen to keep him regular. rec start colace or other stool softener daily while taking tramadol.

## 2018-03-11 NOTE — Progress Notes (Signed)
BP 118/66 (BP Location: Left Arm, Patient Position: Sitting, Cuff Size: Large)   Pulse 66   Temp 97.6 F (36.4 C) (Oral)   Ht 5\' 8"  (1.727 m)   Wt 211 lb 8 oz (95.9 kg)   SpO2 94%   BMI 32.16 kg/m    CC: 6 mo f/u visit Subjective:    Patient ID: Randall Kung., male    DOB: 1940/09/24, 77 y.o.   MRN: [id number]  HPI: Randall Mcgreal. is a 77 y.o. male presenting on 03/11/2018 for 6 mo follow up   Abd discomfort- last visit we recommended gasX, zantac, continued daily PPI and add miralax. Abdominal xray showed significant stool throughout colon. He finds only thing that helps him is milk of magnesia. Had good bowel movement this week with relief.   Chronic back pain - sees Ibazebo at [facility]. Cortisone shot didn't help. On daily tramadol. Worsening shooting pain down legs.   Intermittent bilateral muffled hearing.   Relevant past medical, surgical, family and social history reviewed and updated as indicated. Interim medical history since our last visit reviewed. Allergies and medications reviewed and updated. Outpatient Medications Prior to Visit  Medication Sig Dispense Refill  . aspirin 81 MG EC tablet Take 81 mg by mouth daily.      . Calcium Carbonate-Vitamin D (CALCIUM-VITAMIN D) 600-125 MG-UNIT TABS Take 1 tablet by mouth daily.    . cholecalciferol (VITAMIN D) 1000 units tablet Take 1,000 Units by mouth daily.    [name] ibuprofen (ADVIL,MOTRIN) 200 MG tablet Take 800 mg by mouth every 6 (six) hours as needed for moderate pain.    [name] lisinopril (PRINIVIL,ZESTRIL) 5 MG tablet Take 1 tablet (5 mg total) by mouth daily. 90 tablet 1  . metoprolol succinate (TOPROL XL) 25 MG 24 hr tablet Take 0.5 tablets (12.5 mg total) by mouth daily. 45 tablet 3  . nitroGLYCERIN (NITROSTAT) 0.4 MG SL tablet Place 1 tablet (0.4 mg total) under the tongue every 5 (five) minutes as needed for chest pain. 25 tablet 3  . omeprazole (PRILOSEC) 40 MG capsule TAKE 1 CAPSULE BY MOUTH DAILY 90 capsule  0  . simvastatin (ZOCOR) 40 MG tablet TAKE ONE TABLET BY MOUTH AT BEDTIME. 90 tablet 3  . tamsulosin (FLOMAX) 0.4 MG CAPS capsule Take 1 capsule (0.4 mg total) by mouth daily after supper. 30 capsule 6  . traMADol (ULTRAM) 50 MG tablet TAKE 1 TABLET BY MOUTH THREE TIMES DAILY 90 tablet 0  . vitamin B-12 (CYANOCOBALAMIN) 1000 MCG tablet Take 1,000 mcg by mouth daily.    . vitamin C (ASCORBIC ACID) 500 MG tablet Take 500 mg by mouth daily.    . Zinc 100 MG TABS Take 1 tablet by mouth daily.       No facility-administered medications prior to visit.      Per HPI unless specifically indicated in ROS section below Review of Systems     Objective:    BP 118/66 (BP Location: Left Arm, Patient Position: Sitting, Cuff Size: Large)   Pulse 66   Temp 97.6 F (36.4 C) (Oral)   Ht 5\' 8"  (1.727 m)   Wt 211 lb 8 oz (95.9 kg)   SpO2 94%   BMI 32.16 kg/m   Wt Readings from Last 3 Encounters:  03/11/18 211 lb 8 oz (95.9 kg)  02/04/18 215 lb (97.5 kg)  12/15/17 210 lb 8 oz (95.5 kg)    Physical Exam  Constitutional: He appears well-developed and well-nourished.  No distress.  HENT:  Head: Normocephalic and atraumatic.  Right Ear: Hearing, tympanic membrane, external ear and ear canal normal.  Left Ear: Hearing, tympanic membrane, external ear and ear canal normal.  Nose: Nose normal. No mucosal edema or rhinorrhea. Right sinus exhibits no maxillary sinus tenderness and no frontal sinus tenderness. Left sinus exhibits no maxillary sinus tenderness and no frontal sinus tenderness.  Mouth/Throat: Uvula is midline, oropharynx is clear and moist and mucous membranes are normal. No oropharyngeal exudate, posterior oropharyngeal edema, posterior oropharyngeal erythema or tonsillar abscesses.  Eyes: Pupils are equal, round, and reactive to light. Conjunctivae and EOM are normal. No scleral icterus.  Neck: Normal range of motion. Neck supple.  Cardiovascular: Normal rate, regular rhythm, normal heart  sounds and intact distal pulses.  No murmur heard. Pulmonary/Chest: Effort normal and breath sounds normal. No respiratory distress. He has no wheezes. He has no rales.  Musculoskeletal: He exhibits no edema.  Lymphadenopathy:    He has no cervical adenopathy.  Skin: Skin is warm and dry. No rash noted.  Psychiatric: He has a normal mood and affect.  Nursing note and vitals reviewed.  Results for orders placed or performed in visit on 02/04/18  CBC with Differential/Platelet  Result Value Ref Range   WBC 6.5 4.0 - 10.5 K/uL   RBC 4.86 4.22 - 5.81 Mil/uL   Hemoglobin 14.9 13.0 - 17.0 g/dL   HCT 44.4 39.0 - 52.0 %   MCV 91.4 78.0 - 100.0 fl   MCHC 33.7 30.0 - 36.0 g/dL   RDW 13.3 11.5 - 15.5 %   Platelets 209.0 150.0 - 400.0 K/uL   Neutrophils Relative % 63.6 43.0 - 77.0 %   Lymphocytes Relative 23.3 12.0 - 46.0 %   Monocytes Relative 11.1 3.0 - 12.0 %   Eosinophils Relative 1.4 0.0 - 5.0 %   Basophils Relative 0.6 0.0 - 3.0 %   Neutro Abs 4.2 1.4 - 7.7 K/uL   Lymphs Abs 1.5 0.7 - 4.0 K/uL   Monocytes Absolute 0.7 0.1 - 1.0 K/uL   Eosinophils Absolute 0.1 0.0 - 0.7 K/uL   Basophils Absolute 0.0 0.0 - 0.1 K/uL  Comprehensive metabolic panel  Result Value Ref Range   Sodium 139 135 - 145 mEq/L   Potassium 4.5 3.5 - 5.1 mEq/L   Chloride 105 96 - 112 mEq/L   CO2 29 19 - 32 mEq/L   Glucose, Bld 67 (L) 70 - 99 mg/dL   BUN 20 6 - 23 mg/dL   Creatinine, Ser 1.12 0.40 - 1.50 mg/dL   Total Bilirubin 0.6 0.2 - 1.2 mg/dL   Alkaline Phosphatase 67 39 - 117 U/L   AST 16 0 - 37 U/L   ALT 17 0 - 53 U/L   Total Protein 6.9 6.0 - 8.3 g/dL   Albumin 4.4 3.5 - 5.2 g/dL   Calcium 9.5 8.4 - 10.5 mg/dL   GFR 67.76 >60.00 mL/min  Lipase  Result Value Ref Range   Lipase 48.0 11.0 - 59.0 U/L      Assessment & Plan:   Problem List Items Addressed This Visit    S/P CABG x 4   Paroxysmal atrial fibrillation (HCC)    Sounds regular today.       Generalized abdominal pain    This is  improving with better constipation control. Advised as he is on narcotic regimen needs daily bowel regimen to keep him regular. rec start colace or other stool softener daily while taking tramadol.  Essential hypertension - Primary    Chronic, stable. Continue current regimen.       Coronary artery disease involving native coronary artery of native heart with angina pectoris with documented spasm ([place])    Ongoing confusion about cardiac history - he states he has a vein that was not "hooked up" and was told there was no benefit of hooking it up. Reviewing latest cardiac cath, there seemed to be new RCA occlusion - and plan was medical management to avoid atherectomy in [place]. I asked him to clarify cards recs with cardiology next visit.       Chronic back pain    Managed on tramadol daily. Followed by PMR Ibazebo - upcoming appt next week.       Other Visit Diagnoses    Need for influenza vaccination       Relevant Orders   Flu Vaccine QUAD 36+ mos IM (Completed)       No orders of the defined types were placed in this encounter.  Orders Placed This Encounter  Procedures  . Flu Vaccine QUAD 36+ mos IM    Follow up plan: Return in about 6 months (around 09/10/2018) for annual exam, prior fasting for blood work, medicare wellness visit.  Ria Bush, MD

## 2018-03-11 NOTE — Assessment & Plan Note (Signed)
Managed on tramadol daily. Followed by PMR Ibazebo - upcoming appt next week.

## 2018-03-11 NOTE — Patient Instructions (Addendum)
Flu shot today.  Start daily stool softener like colace or sennakot (over the counter).  Blood pressures are looking good today.

## 2018-03-16 DIAGNOSIS — M48061 Spinal stenosis, lumbar region without neurogenic claudication: Secondary | ICD-10-CM | POA: Diagnosis not present

## 2018-03-16 DIAGNOSIS — M5136 Other intervertebral disc degeneration, lumbar region: Secondary | ICD-10-CM | POA: Diagnosis not present

## 2018-03-23 DIAGNOSIS — M5033 Other cervical disc degeneration, cervicothoracic region: Secondary | ICD-10-CM | POA: Diagnosis not present

## 2018-03-23 DIAGNOSIS — M9901 Segmental and somatic dysfunction of cervical region: Secondary | ICD-10-CM | POA: Diagnosis not present

## 2018-03-23 DIAGNOSIS — M62830 Muscle spasm of back: Secondary | ICD-10-CM | POA: Diagnosis not present

## 2018-03-23 DIAGNOSIS — M9902 Segmental and somatic dysfunction of thoracic region: Secondary | ICD-10-CM | POA: Diagnosis not present

## 2018-03-30 DIAGNOSIS — I10 Essential (primary) hypertension: Secondary | ICD-10-CM | POA: Diagnosis not present

## 2018-03-30 DIAGNOSIS — M48062 Spinal stenosis, lumbar region with neurogenic claudication: Secondary | ICD-10-CM | POA: Diagnosis not present

## 2018-03-30 DIAGNOSIS — Z6831 Body mass index (BMI) 31.0-31.9, adult: Secondary | ICD-10-CM | POA: Diagnosis not present

## 2018-04-01 ENCOUNTER — Encounter: Payer: Self-pay | Admitting: Internal Medicine

## 2018-04-01 ENCOUNTER — Ambulatory Visit: Payer: PPO | Admitting: Internal Medicine

## 2018-04-01 VITALS — BP 130/68 | HR 71 | Resp 16 | Ht 68.0 in | Wt 211.0 lb

## 2018-04-01 DIAGNOSIS — K439 Ventral hernia without obstruction or gangrene: Secondary | ICD-10-CM | POA: Diagnosis not present

## 2018-04-01 DIAGNOSIS — G4733 Obstructive sleep apnea (adult) (pediatric): Secondary | ICD-10-CM

## 2018-04-01 NOTE — Patient Instructions (Signed)
Continue BiPAP as prescribed  Patient would like to see General Surgery for Hernia assessment  Flu shot is up to date.

## 2018-04-01 NOTE — Progress Notes (Signed)
[facility] Consultation      Date: 04/01/2018,   MRN# [id number] Hermes Wafer. [birth year] Code Status:  Code Status History    Date Active Date Inactive Code Status Order ID Comments User Context   01/24/2015  2:11 PM 02/06/2015 11:28 PM Full Code [id number]  Coolidge Breeze, PA-C Inpatient   01/18/2015 12:22 PM 01/18/2015  8:09 PM Full Code [id number]  [name], MD Inpatient   01/05/2015  9:01 PM 01/06/2015  5:21 PM Full Code [id number]  Baxter Hire, MD Inpatient        CHIEF COMPLAINT:  Follow up OSA follo  HISTORY OF PRESENT ILLNESS   77 year old pleasant white male seen today for follow-up of sleep apnea Currently on BiPAP therapy IPAP 20 EPAP 10 Compliance report shows 93 percent for days Greater than 4 hours is 90% Patient has minimal leak AHI is down to 2.4   No signs of infection at this time Has intermittent fatigue and tiredness throughout the day  Patient has a hernia right now that is bothering him He is a former smoker 3 to 4 pack a day for 50 years quit in 2001 CABG 2016  No acute respiratory issues at this time No shortness of breath dyspnea on exertion   Overall patient doing well on BiPAP therapy   CT chest November 20, 2016 Shows right upper lobe nodule approximately 6.9 mm which has not changed since 2016 Patient has known about this right upper lobe nodule for the last 6 years After further evaluation right upper lobe lung nodule is benign      Current Medication:  Current Outpatient Medications:  .  aspirin 81 MG EC tablet, Take 81 mg by mouth daily.  , Disp: , Rfl:  .  Calcium Carbonate-Vitamin D (CALCIUM-VITAMIN D) 600-125 MG-UNIT TABS, Take 1 tablet by mouth daily., Disp: , Rfl:  .  cholecalciferol (VITAMIN D) 1000 units tablet, Take 1,000 Units by mouth daily., Disp: , Rfl:  .  ibuprofen (ADVIL,MOTRIN) 200 MG tablet, Take 800 mg by mouth every 6 (six) hours as needed for moderate pain., Disp: , Rfl:  .   lisinopril (PRINIVIL,ZESTRIL) 5 MG tablet, Take 1 tablet (5 mg total) by mouth daily., Disp: 90 tablet, Rfl: 1 .  metoprolol succinate (TOPROL XL) 25 MG 24 hr tablet, Take 0.5 tablets (12.5 mg total) by mouth daily., Disp: 45 tablet, Rfl: 3 .  nitroGLYCERIN (NITROSTAT) 0.4 MG SL tablet, Place 1 tablet (0.4 mg total) under the tongue every 5 (five) minutes as needed for chest pain., Disp: 25 tablet, Rfl: 3 .  omeprazole (PRILOSEC) 40 MG capsule, TAKE 1 CAPSULE BY MOUTH DAILY, Disp: 90 capsule, Rfl: 0 .  simvastatin (ZOCOR) 40 MG tablet, TAKE ONE TABLET BY MOUTH AT BEDTIME., Disp: 90 tablet, Rfl: 3 .  tamsulosin (FLOMAX) 0.4 MG CAPS capsule, Take 1 capsule (0.4 mg total) by mouth daily after supper., Disp: 30 capsule, Rfl: 6 .  traMADol (ULTRAM) 50 MG tablet, TAKE 1 TABLET BY MOUTH THREE TIMES DAILY, Disp: 90 tablet, Rfl: 0 .  vitamin B-12 (CYANOCOBALAMIN) 1000 MCG tablet, Take 1,000 mcg by mouth daily., Disp: , Rfl:  .  vitamin C (ASCORBIC ACID) 500 MG tablet, Take 500 mg by mouth daily., Disp: , Rfl:  .  Zinc 100 MG TABS, Take 1 tablet by mouth daily.  , Disp: , Rfl:     ALLERGIES   Patient has no known allergies.     REVIEW OF SYSTEMS  Review of Systems  Constitutional: Positive for malaise/fatigue. Negative for chills, diaphoresis, fever and weight loss.  HENT: Negative for congestion and hearing loss.   Respiratory: Negative for cough, hemoptysis, sputum production, shortness of breath and wheezing.   Cardiovascular: Negative for chest pain, palpitations and orthopnea.  Gastrointestinal: Negative for heartburn.  Skin: Negative for rash.  Neurological: Negative for weakness.  All other systems reviewed and are negative.  BP 130/68 (BP Location: Left Arm, Cuff Size: Large)   Pulse 71   Resp 16   Ht 5\' 8"  (1.727 m)   Wt 211 lb (95.7 kg)   SpO2 94%   BMI 32.08 kg/m     PHYSICAL EXAM  Physical Exam  Constitutional: He is oriented to person, place, and time. He appears  well-developed and well-nourished. No distress.  Eyes: No scleral icterus.  Neck: Neck supple.  Cardiovascular: Normal rate, regular rhythm and normal heart sounds.  No murmur heard. Pulmonary/Chest: Effort normal and breath sounds normal. No stridor. No respiratory distress. He has no wheezes.  Musculoskeletal: Normal range of motion. He exhibits no edema.  Neurological: He is alert and oriented to person, place, and time. No cranial nerve deficit.  Skin: Skin is warm.  Psychiatric: He has a normal mood and affect.        IMAGING    CT chest November 20, 2016 I have Independently reviewed images of  CT chest   Interpretation:6.9 MM RUL nodule noted Has not change in size of the last 2 years    ASSESSMENT/PLAN    77 year old pleasant white male seen today for follow-up of sleep apnea in the setting of a right upper lobe lung nodule which is noted to be benign associated with obesity and deconditioned state  #1 OSA Patient is benefiting and using BiPAP therapy nightly No issues with mask at this time Wears a full facemask His fatigue and sleepiness is not related to his underlying sleep apnea because it is treated  #2 right upper lobe lung nodule Has not changed in size for the last 2 years this is benign in nature no further CT scan needed    #3 obesity -recommend significant weight loss -recommend changing diet  #4 deconditioned state -Recommend increased daily activity and exercise  #5 patient would like to get general surgery consultation for hernia   Patient satisfied with visit Patient uses and benefits BiPAP therapy for his CPAP  Follow-up in 1 year Patient is up-to-date with his flu shot   Corrin Parker, M.D.  [facility]  Medical Director [place] Director [place] Department

## 2018-04-01 NOTE — Addendum Note (Signed)
Addended by: Stephanie Coup on: 04/01/2018 10:44 AM   Modules accepted: Orders

## 2018-04-02 ENCOUNTER — Other Ambulatory Visit: Payer: Self-pay | Admitting: Family Medicine

## 2018-04-13 ENCOUNTER — Other Ambulatory Visit: Payer: Self-pay | Admitting: Neurosurgery

## 2018-04-13 DIAGNOSIS — M48062 Spinal stenosis, lumbar region with neurogenic claudication: Secondary | ICD-10-CM

## 2018-04-15 DIAGNOSIS — D0462 Carcinoma in situ of skin of left upper limb, including shoulder: Secondary | ICD-10-CM | POA: Diagnosis not present

## 2018-04-15 DIAGNOSIS — D0472 Carcinoma in situ of skin of left lower limb, including hip: Secondary | ICD-10-CM | POA: Diagnosis not present

## 2018-04-15 DIAGNOSIS — C44719 Basal cell carcinoma of skin of left lower limb, including hip: Secondary | ICD-10-CM | POA: Diagnosis not present

## 2018-04-20 ENCOUNTER — Ambulatory Visit (INDEPENDENT_AMBULATORY_CARE_PROVIDER_SITE_OTHER): Payer: PPO | Admitting: General Surgery

## 2018-04-20 ENCOUNTER — Encounter: Payer: Self-pay | Admitting: General Surgery

## 2018-04-20 ENCOUNTER — Other Ambulatory Visit: Payer: Self-pay

## 2018-04-20 VITALS — BP 142/76 | HR 60 | Resp 16 | Ht 68.0 in | Wt 211.0 lb

## 2018-04-20 DIAGNOSIS — M6208 Separation of muscle (nontraumatic), other site: Secondary | ICD-10-CM | POA: Diagnosis not present

## 2018-04-20 NOTE — Patient Instructions (Signed)
The patient is aware to call back for any questions or concerns.  

## 2018-04-20 NOTE — Progress Notes (Signed)
Patient ID: Randall Ali., male   DOB: [birth year], 77 y.o.   MRN: [id number]  Chief Complaint  Patient presents with  . Hernia    HPI Randall Honor. is a 77 y.o. male.  Patient here today for an evaluation of a hernia referred by Dr Lucretia Roers.  He states that he noticed a knot above his belly button about one year ago.  It does not seem to be causing some abdominal pain, only "tightness". He states he can even drink a bottle of water and he feels the tightness and bloating. The knot is worse lying on his back. No nausea, vomiting, constipation or diarrhea noted. He takes MOM about 3 times a week since he takes tramadol for chronic back pain. Abdominal films were 02-04-18. CT scan chest was 06-04-17. He is retired from working with a [employer] consultant, he also owned a bar in the past.  HPI  Past Medical History:  Diagnosis Date  . Allergy    seasonal  . Arthritis    all over- in general   . CAD (coronary artery disease)    a. inferior wall MI 10/01 s/p PCI/DES to RCA; b. Myoview 3/16 neg for ischemia; c. LHC 8/16: ostLAD 80%, OM1 70%, OM2 70% x 2 lesions, mRCA 30%, dRCA 70% s/p 4-V CABG 01/24/15 (LIMA-LAD, VG- OM1, VG-OM2, VG-PDA)   . Cancer (HCC)    skin, melanoma  . Carotid artery disease ([place])    a. [place] 8/16: 1-39% bilateral ICA stenosis  . Cataract    removed  . Diastolic dysfunction    a. TTE 8/16: EF 55-60%, no RWMA, Gr1DD, calcified mitral annulus, mild biatrial enlargement  . Erectile dysfunction   . History of elbow surgery   . HLD (hyperlipidemia)   . HTN (hypertension)   . Inferior myocardial infarction ([place]) 10/01   stent RCA  . Postoperative wound infection 02/02/2015  . Reflux esophagitis   . Sleep apnea 2017   CPAP at night    Past Surgical History:  Procedure Laterality Date  . arm surgery  2010  . CARDIAC CATHETERIZATION  06/24/11  . CARDIAC CATHETERIZATION N/A 01/18/2015   Procedure: Left Heart Cath with coronary angiography;  Surgeon: Minna Merritts, MD;  Location: [facility];  Service: Cardiovascular;  Laterality: N/A;  . CARDIAC CATHETERIZATION N/A 01/18/2015   Procedure: Intravascular Pressure Wire/FFR Study;  Surgeon: [name], MD;  Location: [facility];  Service: Cardiovascular;  Laterality: N/A;  . CAROTID STENT  03/10/2011  . COLONOSCOPY  2010  . COLONOSCOPY  06/14/2014   Dr Hilarie Fredrickson  . CORONARY ARTERY BYPASS GRAFT N/A 01/24/2015   Procedure: CORONARY ARTERY BYPASS GRAFTING x 4 (LIMA-LAD, SVG-Int 1- Int 2, SVG-PD) ENDOSCOPIC GREATER SAPHENOUS VEIN HARVEST LEFT LEG;  Surgeon: [name], MD;  Location: [facility];  Service: Open Heart Surgery;  Laterality: N/A;  . ESOPHAGOGASTRODUODENOSCOPY (EGD) WITH PROPOFOL N/A 04/24/2016   Procedure: ESOPHAGOGASTRODUODENOSCOPY (EGD) WITH PROPOFOL;  Surgeon: Jerene Bears, MD;  Location: [facility];  Service: Gastroenterology;  Laterality: N/A;  . EYE SURGERY     lasik 15 yrs. ago, cataracts removed - both eyes   . HAMMER TOE SURGERY     right toe  . LEFT HEART CATH AND CORONARY ANGIOGRAPHY Left 06/10/2017   Procedure: LEFT HEART CATH AND CORONARY ANGIOGRAPHY;  Surgeon: Minna Merritts, MD;  Location: [facility];  Service: Cardiovascular;  Laterality: Left;  . NASAL SINUS SURGERY  2008  septpolasty, bilateral turbinate reduction  . SHOULDER ARTHROSCOPY  2012  . TEE WITHOUT CARDIOVERSION N/A 01/24/2015   Procedure: TRANSESOPHAGEAL ECHOCARDIOGRAM (TEE);  Surgeon: [name], MD;  Location: [facility];  Service: Open Heart Surgery;  Laterality: N/A;  . [place]  . UPPER GI ENDOSCOPY  07/2014, 04-24-16   Dr Raquel James  . WRIST SURGERY  2011    Family History  Problem Relation Age of Onset  . Hypertension Mother   . Heart disease Mother   . Hypertension Father   . Diabetes Father   . Heart disease Brother 5  . Cancer Paternal Grandfather   . Colon cancer Neg Hx   . Prostate cancer Neg Hx   . Bladder Cancer Neg Hx   . Kidney cancer Neg  Hx     Social History Social History   Tobacco Use  . Smoking status: Former Smoker    Packs/day: 2.50    Years: 40.00    Pack years: 100.00    Types: Cigarettes    Last attempt to quit: 03/24/2000    Years since quitting: 18.0  . Smokeless tobacco: Never Used  Substance Use Topics  . Alcohol use: Yes    Alcohol/week: 10.0 standard drinks    Types: 10 Shots of liquor per week  . Drug use: No    No Known Allergies  Current Outpatient Medications  Medication Sig Dispense Refill  . aspirin 81 MG EC tablet Take 81 mg by mouth daily.      . Calcium Carbonate-Vitamin D (CALCIUM-VITAMIN D) 600-125 MG-UNIT TABS Take 1 tablet by mouth daily.    . cholecalciferol (VITAMIN D) 1000 units tablet Take 1,000 Units by mouth daily.    [name] ibuprofen (ADVIL,MOTRIN) 200 MG tablet Take 800 mg by mouth every 6 (six) hours as needed for moderate pain.    [name] lisinopril (PRINIVIL,ZESTRIL) 5 MG tablet Take 1 tablet (5 mg total) by mouth daily. 90 tablet 1  . metoprolol succinate (TOPROL XL) 25 MG 24 hr tablet Take 0.5 tablets (12.5 mg total) by mouth daily. 45 tablet 3  . nitroGLYCERIN (NITROSTAT) 0.4 MG SL tablet Place 1 tablet (0.4 mg total) under the tongue every 5 (five) minutes as needed for chest pain. 25 tablet 3  . omeprazole (PRILOSEC) 40 MG capsule TAKE 1 CAPSULE BY MOUTH EVERY DAY 90 capsule 1  . simvastatin (ZOCOR) 40 MG tablet TAKE ONE TABLET BY MOUTH AT BEDTIME. 90 tablet 3  . tamsulosin (FLOMAX) 0.4 MG CAPS capsule Take 1 capsule (0.4 mg total) by mouth daily after supper. 30 capsule 6  . traMADol (ULTRAM) 50 MG tablet TAKE 1 TABLET BY MOUTH THREE TIMES DAILY (Patient taking differently: every 6 (six) hours as needed. ) 90 tablet 0  . vitamin B-12 (CYANOCOBALAMIN) 1000 MCG tablet Take 1,000 mcg by mouth daily.    . vitamin C (ASCORBIC ACID) 500 MG tablet Take 500 mg by mouth daily.    . Zinc 100 MG TABS Take 1 tablet by mouth daily.       No current facility-administered medications for  this visit.     Review of Systems Review of Systems  Constitutional: Negative.   Respiratory: Negative.   Cardiovascular: Negative.   Gastrointestinal: Positive for constipation (Chronic constipation managed with MOM 2-3 x/ week. ). Negative for abdominal pain and diarrhea.    Blood pressure (!) 142/76, pulse 60, resp. rate 16, height 5\' 8"  (1.727 m), weight 211 lb (95.7 kg), SpO2 95 %.  Physical  Exam Physical Exam  Constitutional: He is oriented to person, place, and time. He appears well-developed and well-nourished.  HENT:  Mouth/Throat: No oropharyngeal exudate.  Eyes: Conjunctivae are normal. No scleral icterus.  Neck: Neck supple.  Cardiovascular: Normal rate, regular rhythm and normal heart sounds.  Pulmonary/Chest: Effort normal and breath sounds normal.  Abdominal: Soft. Bowel sounds are normal. No hernia.    Diastasis recti present  Neurological: He is alert and oriented to person, place, and time.  Skin: Skin is warm and dry.  Psychiatric: His behavior is normal.    Data Reviewed CT scan of the chest dated November 20, 2016.  This shows almost on the umbilical area without evidence of fascial defect.  Plain films of the abdomen dated February 04, 2018 reviewed.  Increased stool burden on the right side of the colon, otherwise unremarkable.  PCP notes of March 11, 2018 reviewed.  June 14, 2017 colonoscopy: Normal.  Assessment    Diastases recti.  No clear evidence of ventral hernia.    Plan    No indication for surgical intervention at this time. Follow up as needed. The patient is aware to call back for any questions or new concerns.      HPI, Physical Exam, Assessment and Plan have been scribed under the direction and in the presence of Robert Bellow, MD. Karie Fetch, RN  I have completed the exam and reviewed the above documentation for accuracy and completeness.  I agree with the above.  Haematologist has been used and any errors in  dictation or transcription are unintentional.  Hervey Ard, M.D., F.A.C.S.  Randall Ali 04/20/2018, 7:59 PM

## 2018-04-21 DIAGNOSIS — A498 Other bacterial infections of unspecified site: Secondary | ICD-10-CM | POA: Diagnosis not present

## 2018-04-21 DIAGNOSIS — G4733 Obstructive sleep apnea (adult) (pediatric): Secondary | ICD-10-CM | POA: Diagnosis not present

## 2018-04-21 DIAGNOSIS — T8140XA Infection following a procedure, unspecified, initial encounter: Secondary | ICD-10-CM | POA: Diagnosis not present

## 2018-04-27 ENCOUNTER — Ambulatory Visit
Admission: RE | Admit: 2018-04-27 | Discharge: 2018-04-27 | Disposition: A | Discharge status: Home / Self Care | Payer: PPO | Source: Ambulatory Visit | Attending: Neurosurgery | Admitting: Neurosurgery

## 2018-04-27 DIAGNOSIS — M545 Low back pain: Secondary | ICD-10-CM | POA: Diagnosis not present

## 2018-04-27 DIAGNOSIS — M48062 Spinal stenosis, lumbar region with neurogenic claudication: Secondary | ICD-10-CM

## 2018-04-28 ENCOUNTER — Other Ambulatory Visit: Payer: Self-pay | Admitting: Family Medicine

## 2018-04-28 NOTE — Telephone Encounter (Signed)
Name of Medication: Tramadol Name of Pharmacy: [facility] or Written Date and Quantity: 04/02/18, #90/0 Last Office Visit and Type: 03/11/18, f/u Next Office Visit and Type: 09/13/18, CPE Last Controlled Substance Agreement Date: 01/18/16 Last UDS: 01/18/16

## 2018-05-03 NOTE — Telephone Encounter (Signed)
Eprescribed.

## 2018-05-13 ENCOUNTER — Telehealth: Payer: Self-pay

## 2018-05-13 DIAGNOSIS — M48062 Spinal stenosis, lumbar region with neurogenic claudication: Secondary | ICD-10-CM | POA: Diagnosis not present

## 2018-05-13 DIAGNOSIS — Z6832 Body mass index (BMI) 32.0-32.9, adult: Secondary | ICD-10-CM | POA: Diagnosis not present

## 2018-05-13 MED ORDER — SIMVASTATIN 40 MG PO TABS: 40.0000 mg | ORAL_TABLET | Freq: Every day | ORAL | 1 refills | Status: DC

## 2018-05-13 NOTE — Telephone Encounter (Signed)
rx sent to [facility].

## 2018-05-17 DIAGNOSIS — M5033 Other cervical disc degeneration, cervicothoracic region: Secondary | ICD-10-CM | POA: Diagnosis not present

## 2018-05-17 DIAGNOSIS — M9902 Segmental and somatic dysfunction of thoracic region: Secondary | ICD-10-CM | POA: Diagnosis not present

## 2018-05-17 DIAGNOSIS — M9901 Segmental and somatic dysfunction of cervical region: Secondary | ICD-10-CM | POA: Diagnosis not present

## 2018-05-17 DIAGNOSIS — M62830 Muscle spasm of back: Secondary | ICD-10-CM | POA: Diagnosis not present

## 2018-05-20 DIAGNOSIS — M9901 Segmental and somatic dysfunction of cervical region: Secondary | ICD-10-CM | POA: Diagnosis not present

## 2018-05-20 DIAGNOSIS — M62830 Muscle spasm of back: Secondary | ICD-10-CM | POA: Diagnosis not present

## 2018-05-20 DIAGNOSIS — M5033 Other cervical disc degeneration, cervicothoracic region: Secondary | ICD-10-CM | POA: Diagnosis not present

## 2018-05-20 DIAGNOSIS — M9902 Segmental and somatic dysfunction of thoracic region: Secondary | ICD-10-CM | POA: Diagnosis not present

## 2018-05-27 ENCOUNTER — Other Ambulatory Visit: Payer: Self-pay | Admitting: Family Medicine

## 2018-05-28 NOTE — Telephone Encounter (Signed)
Name of Medication: Tramadol Name of Pharmacy: [facility] or Written Date and Quantity: 05/03/18, #90/0 Last Office Visit and Type: 03/11/18, f/u Next Office Visit and Type: 09/13/18, CPE Last Controlled Substance Agreement Date: none Last UDS: 01/18/16

## 2018-05-29 NOTE — Telephone Encounter (Signed)
Eprescribed.

## 2018-06-10 DIAGNOSIS — M9901 Segmental and somatic dysfunction of cervical region: Secondary | ICD-10-CM | POA: Diagnosis not present

## 2018-06-10 DIAGNOSIS — M5033 Other cervical disc degeneration, cervicothoracic region: Secondary | ICD-10-CM | POA: Diagnosis not present

## 2018-06-10 DIAGNOSIS — M62830 Muscle spasm of back: Secondary | ICD-10-CM | POA: Diagnosis not present

## 2018-06-10 DIAGNOSIS — M9902 Segmental and somatic dysfunction of thoracic region: Secondary | ICD-10-CM | POA: Diagnosis not present

## 2018-06-12 DIAGNOSIS — M5033 Other cervical disc degeneration, cervicothoracic region: Secondary | ICD-10-CM | POA: Diagnosis not present

## 2018-06-12 DIAGNOSIS — M62830 Muscle spasm of back: Secondary | ICD-10-CM | POA: Diagnosis not present

## 2018-06-12 DIAGNOSIS — M9901 Segmental and somatic dysfunction of cervical region: Secondary | ICD-10-CM | POA: Diagnosis not present

## 2018-06-12 DIAGNOSIS — M9902 Segmental and somatic dysfunction of thoracic region: Secondary | ICD-10-CM | POA: Diagnosis not present

## 2018-06-15 DIAGNOSIS — M62830 Muscle spasm of back: Secondary | ICD-10-CM | POA: Diagnosis not present

## 2018-06-15 DIAGNOSIS — M5033 Other cervical disc degeneration, cervicothoracic region: Secondary | ICD-10-CM | POA: Diagnosis not present

## 2018-06-15 DIAGNOSIS — M9901 Segmental and somatic dysfunction of cervical region: Secondary | ICD-10-CM | POA: Diagnosis not present

## 2018-06-15 DIAGNOSIS — M9902 Segmental and somatic dysfunction of thoracic region: Secondary | ICD-10-CM | POA: Diagnosis not present

## 2018-06-21 NOTE — Progress Notes (Signed)
Cardiology Office Note  Date:  06/22/2018   ID:  Randall Kung., DOB March 31, 1941, MRN [id number]  PCP:  Ria Bush, MD   Chief Complaint  Patient presents with  . Other    12 month follow up. Patient denies chest pain and SOB. Meds reviewed verbally with patient.     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  Previous admit in 2011 for mild chest pain and sweating.  Stress test showed no ischemia with inferior scar.  Previous cardiac catheterization showing moderate OM disease.  Repeat catheterization  in August 2016 showing severe three-vessel disease,  sent for CABG on 01/24/2015.  He presents today for follow-up of his coronary artery disease  Chronic back pain,  Seen by neurosurgery Having leg weakness Hips and thighs are weak  Denies having significant shortness of breath or chest pain Reports having one episode chest pain in the past, next he took nitro and symptoms went away No further episodes since that time  Compliance with his medications Reviewed previous results with him as detailed below  Lab work reviewed with him, numbers at goal   EKG personally reviewed by myself on todays visit Shows normal sinus rhythm with rate 68 bpm old inferior MI, no change from prior EKG  Other past medical history reviewed Cardiac catheterization 06/10/2017 Severe three vessel disease, occluded SVG to the RCA, Patent SVG to OM 1 and OM2, patent LIMA to the LAD Will try medical management first Add plavix, nitrates  intervention would require atherectomy in [facility]  GERD sx improved on PPI EGD  07/2014 showing moderate to severe esophagitis  postoperative atrial fibrillation,  postoperative delirium and began having fevers on 01/29/2015.  some drainage from the inferior portion of his sternal wound. Blood cultures and  wound cultures grew Enterobacter aerogenes. He was discharged on IV ceftriaxone  and completed 6 weeks of therapy . He took probiotics during this time  right foot surgery in May 2016. repeat hardware fusion of his right great toe and had hammertoe repair of his right second toe.   postoperative infection with persistent drainage from the second toe. A culture in late July grew MSSA. course of doxycycline.  The drainage stopped but he has continued to have redness and swelling  He still has some pain when walking   In 2013, he was having worsening chest pain medically with exertion.  cardiac catheterization that showed moderate obtuse marginal disease, otherwise no significant stenosis. This was discussed with the interventional physicians in [facility] and recommendation was made for medical management.  prior EGD  that showed an ulcer. Previously had trouble swallowing.   PMH:   has a past medical history of Allergy, Arthritis, CAD (coronary artery disease), Cancer ([place]), Carotid artery disease ([place]), Cataract, Diastolic dysfunction, Erectile dysfunction, History of elbow surgery, HLD (hyperlipidemia), HTN (hypertension), Inferior myocardial infarction ([place]) (10/01), Postoperative wound infection (02/02/2015), Reflux esophagitis, and Sleep apnea (2017).  PSH:    Past Surgical History:  Procedure Laterality Date  . arm surgery  2010  . CARDIAC CATHETERIZATION  06/24/11  . CARDIAC CATHETERIZATION N/A 01/18/2015   Procedure: Left Heart Cath with coronary angiography;  Surgeon: Minna Merritts, MD;  Location: [place] [facility];  Service: Cardiovascular;  Laterality: N/A;  . CARDIAC CATHETERIZATION N/A 01/18/2015   Procedure: Intravascular Pressure Wire/FFR Study;  Surgeon:  [name], MD;  Location: [place] [facility];  Service: Cardiovascular;  Laterality: N/A;  . CAROTID STENT  03/10/2011  . COLONOSCOPY  2010  . COLONOSCOPY  06/14/2014   Dr Hilarie Fredrickson  . CORONARY ARTERY BYPASS  GRAFT N/A 01/24/2015   Procedure: CORONARY ARTERY BYPASS GRAFTING x 4 (LIMA-LAD, SVG-Int 1- Int 2, SVG-PD) ENDOSCOPIC GREATER SAPHENOUS VEIN HARVEST LEFT LEG;  Surgeon: [name], MD;  Location: [place];  Service: Open Heart Surgery;  Laterality: N/A;  . ESOPHAGOGASTRODUODENOSCOPY (EGD) WITH PROPOFOL N/A 04/24/2016   Procedure: ESOPHAGOGASTRODUODENOSCOPY (EGD) WITH PROPOFOL;  Surgeon: Jerene Bears, MD;  Location: [facility];  Service: Gastroenterology;  Laterality: N/A;  . EYE SURGERY     lasik 15 yrs. ago, cataracts removed - both eyes   . HAMMER TOE SURGERY     right toe  . LEFT HEART CATH AND CORONARY ANGIOGRAPHY Left 06/10/2017   Procedure: LEFT HEART CATH AND CORONARY ANGIOGRAPHY;  Surgeon: Minna Merritts, MD;  Location: [place] [facility];  Service: Cardiovascular;  Laterality: Left;  . NASAL SINUS SURGERY  2008   septpolasty, bilateral turbinate reduction  . SHOULDER ARTHROSCOPY  2012  . TEE WITHOUT CARDIOVERSION N/A 01/24/2015   Procedure: TRANSESOPHAGEAL ECHOCARDIOGRAM (TEE);  Surgeon: [name], MD;  Location: [place];  Service: Open Heart Surgery;  Laterality: N/A;  . [place]  . UPPER GI ENDOSCOPY  07/2014, 04-24-16   Dr Raquel James  . WRIST SURGERY  2011    Current Outpatient Medications  Medication Sig Dispense Refill  . aspirin 81 MG EC tablet Take 81 mg by mouth daily.      . Calcium Carbonate-Vitamin D (CALCIUM-VITAMIN D) 600-125 MG-UNIT TABS Take 1 tablet by mouth daily.    . cholecalciferol (VITAMIN D) 1000 units tablet Take 1,000 Units by mouth daily.    [name] ibuprofen (ADVIL,MOTRIN) 200 MG tablet Take 800 mg by mouth every 6 (six) hours as needed for moderate pain.    [name] lisinopril (PRINIVIL,ZESTRIL) 5 MG tablet Take 1 tablet (5 mg total) by mouth daily. 90 tablet 1  . metoprolol succinate (TOPROL XL) 25 MG 24 hr tablet Take 0.5 tablets (12.5 mg total) by mouth daily. 45 tablet 3  . nitroGLYCERIN (NITROSTAT) 0.4 MG SL tablet Place 1 tablet (0.4 mg  total) under the tongue every 5 (five) minutes as needed for chest pain. 25 tablet 3  . omeprazole (PRILOSEC) 40 MG capsule TAKE 1 CAPSULE BY MOUTH EVERY DAY 90 capsule 1  . simvastatin (ZOCOR) 40 MG tablet Take 1 tablet (40 mg total) by mouth at bedtime. 90 tablet 1  . tamsulosin (FLOMAX) 0.4 MG CAPS capsule Take 1 capsule (0.4 mg total) by mouth daily after supper. 30 capsule 6  . traMADol (ULTRAM) 50 MG tablet TAKE 1 TABLET BY MOUTH THREE TIMES DAILY 90 tablet 0  . vitamin B-12 (CYANOCOBALAMIN) 1000 MCG tablet Take 1,000 mcg by mouth daily.    . vitamin C (ASCORBIC ACID) 500 MG tablet Take 500 mg by mouth daily.    . Zinc 100 MG TABS Take 1 tablet by mouth daily.       No current facility-administered medications for this visit.      Allergies:   Patient has no known allergies.   Social History:  The patient  reports that he quit smoking about 18 years ago. His smoking use included cigarettes. He has a 100.00 pack-year smoking history. He has never used smokeless tobacco. He reports current alcohol  use of about 10.0 standard drinks of alcohol per week. He reports that he does not use drugs.   Family History:   family history includes Cancer in his paternal grandfather; Diabetes in his father; Heart disease in his mother; Heart disease (age of onset: 12) in his brother; Hypertension in his father and mother.    Review of Systems: Review of Systems  Constitutional: Negative.   Respiratory: Negative.   Cardiovascular: Negative.   Gastrointestinal: Negative.   Musculoskeletal: Negative.        Leg weakness  Neurological: Negative.   Psychiatric/Behavioral: Negative.   All other systems reviewed and are negative.    PHYSICAL EXAM: VS:  BP 126/64 (BP Location: Left Arm, Patient Position: Sitting, Cuff Size: Normal)   Pulse 68   Ht 5\' 8"  (1.727 m)   Wt 211 lb (95.7 kg)   BMI 32.08 kg/m  , BMI Body mass index is 32.08 kg/m. Constitutional:  oriented to person, place, and time. No  distress.  HENT:  Head: Grossly normal Eyes:  no discharge. No scleral icterus.  Neck: No JVD, no carotid bruits  Cardiovascular: Regular rate and rhythm, no murmurs appreciated Pulmonary/Chest: Clear to auscultation bilaterally, no wheezes or rails Abdominal: Soft.  no distension.  no tenderness.  Musculoskeletal: Normal range of motion Neurological:  normal muscle tone. Coordination normal. No atrophy Skin: Skin warm and dry Psychiatric: normal affect, pleasant  Recent Labs: 02/04/2018: ALT 17; BUN 20; Creatinine, Ser 1.12; Hemoglobin 14.9; Platelets 209.0; Potassium 4.5; Sodium 139    Lipid Panel Lab Results  Component Value Date   CHOL 119 08/24/2017   HDL 45.80 08/24/2017   LDLCALC 57 08/24/2017   TRIG 82.0 08/24/2017    Wt Readings from Last 3 Encounters:  06/22/18 211 lb (95.7 kg)  04/20/18 211 lb (95.7 kg)  04/01/18 211 lb (95.7 kg)     ASSESSMENT AND PLAN:   Atherosclerosis of native coronary artery of native heart without angina pectoris -  We did discuss starting Xarelto 2.5 mill grams twice a day with his aspirin.  "I'm thin enough" reports that he bleeds a lot Does not want Plavix Rare episode of chest pain Recommend he continue his exercise regiment  Paroxysmal atrial fibrillation ([place]) - Plan: EKG 12-Lead Prior history of postop A. fib after bypass surgery Does not want anticoagulation, " I bleed enough as it is" We will continue to monitor  PAD (peripheral artery disease) ([facility])  mild carotid arterial disease seen in 2016  Medical management ,  stable   Continue aggressive lipid management  Pure hypercholesterolemia Numbers at goal but does not want to add any other medications  Essential hypertension Blood pressure stable, no changes made  S/P CABG x 4 No anginal symptoms, active, exercising Recommended more aerobic  GERD moderate to severe esophagitis on EGD  On PPI  erectile dysfunction   Total encounter time more than 25 minutes   Greater than 50% was spent in counseling and coordination of care with the patient  Disposition:   F/U  12 months   Orders Placed This Encounter  Procedures  . EKG 12-Lead     Signed, Esmond Plants, M.D., Ph.D. 06/22/2018  [place], [place]

## 2018-06-22 ENCOUNTER — Ambulatory Visit: Payer: PPO | Admitting: Cardiovascular Disease

## 2018-06-22 ENCOUNTER — Encounter: Payer: Self-pay | Admitting: Cardiovascular Disease

## 2018-06-22 VITALS — BP 126/64 | HR 68 | Ht 68.0 in | Wt 211.0 lb

## 2018-06-22 DIAGNOSIS — I6523 Occlusion and stenosis of bilateral carotid arteries: Secondary | ICD-10-CM

## 2018-06-22 DIAGNOSIS — I10 Essential (primary) hypertension: Secondary | ICD-10-CM | POA: Diagnosis not present

## 2018-06-22 DIAGNOSIS — I480 Paroxysmal atrial fibrillation: Secondary | ICD-10-CM

## 2018-06-22 DIAGNOSIS — I739 Peripheral vascular disease, unspecified: Secondary | ICD-10-CM

## 2018-06-22 DIAGNOSIS — E782 Mixed hyperlipidemia: Secondary | ICD-10-CM | POA: Diagnosis not present

## 2018-06-22 DIAGNOSIS — I25110 Atherosclerotic heart disease of native coronary artery with unstable angina pectoris: Secondary | ICD-10-CM | POA: Diagnosis not present

## 2018-06-22 DIAGNOSIS — Z951 Presence of aortocoronary bypass graft: Secondary | ICD-10-CM

## 2018-06-22 NOTE — Patient Instructions (Signed)

## 2018-07-05 ENCOUNTER — Other Ambulatory Visit: Payer: Self-pay | Admitting: Family Medicine

## 2018-07-05 NOTE — Telephone Encounter (Signed)
Name of Medication: Tramadol Name of Pharmacy: [facility] or Written Date and Quantity: 05/29/18, #90/0 Last Office Visit and Type: 03/11/18, f/u Next Office Visit and Type: 09/13/18, CPE Last Controlled Substance Agreement Date: 01/18/16 Last UDS: 01/18/16

## 2018-07-07 NOTE — Telephone Encounter (Signed)
Eprescribed.

## 2018-07-09 DIAGNOSIS — H43813 Vitreous degeneration, bilateral: Secondary | ICD-10-CM | POA: Diagnosis not present

## 2018-07-28 DIAGNOSIS — M9901 Segmental and somatic dysfunction of cervical region: Secondary | ICD-10-CM | POA: Diagnosis not present

## 2018-07-28 DIAGNOSIS — M62830 Muscle spasm of back: Secondary | ICD-10-CM | POA: Diagnosis not present

## 2018-07-28 DIAGNOSIS — M5033 Other cervical disc degeneration, cervicothoracic region: Secondary | ICD-10-CM | POA: Diagnosis not present

## 2018-07-28 DIAGNOSIS — M9902 Segmental and somatic dysfunction of thoracic region: Secondary | ICD-10-CM | POA: Diagnosis not present

## 2018-07-30 DIAGNOSIS — M9902 Segmental and somatic dysfunction of thoracic region: Secondary | ICD-10-CM | POA: Diagnosis not present

## 2018-07-30 DIAGNOSIS — M9901 Segmental and somatic dysfunction of cervical region: Secondary | ICD-10-CM | POA: Diagnosis not present

## 2018-07-30 DIAGNOSIS — M5033 Other cervical disc degeneration, cervicothoracic region: Secondary | ICD-10-CM | POA: Diagnosis not present

## 2018-07-30 DIAGNOSIS — M62830 Muscle spasm of back: Secondary | ICD-10-CM | POA: Diagnosis not present

## 2018-08-03 ENCOUNTER — Other Ambulatory Visit: Payer: Self-pay | Admitting: Family Medicine

## 2018-08-03 NOTE — Telephone Encounter (Signed)
Name of Medication: Tramadol Name of Pharmacy: [facility] or Written Date and Quantity: 07/07/18, #90 Last Office Visit and Type: 03/11/18, f/u Next Office Visit and Type: 09/13/18, CPE Last Controlled Substance Agreement Date: 01/18/16 Last UDS: 01/18/16

## 2018-08-03 NOTE — Telephone Encounter (Signed)
Eprescribed.

## 2018-08-05 DIAGNOSIS — M7060 Trochanteric bursitis, unspecified hip: Secondary | ICD-10-CM | POA: Diagnosis not present

## 2018-08-12 DIAGNOSIS — M62830 Muscle spasm of back: Secondary | ICD-10-CM | POA: Diagnosis not present

## 2018-08-12 DIAGNOSIS — M5033 Other cervical disc degeneration, cervicothoracic region: Secondary | ICD-10-CM | POA: Diagnosis not present

## 2018-08-12 DIAGNOSIS — M9902 Segmental and somatic dysfunction of thoracic region: Secondary | ICD-10-CM | POA: Diagnosis not present

## 2018-08-12 DIAGNOSIS — M9901 Segmental and somatic dysfunction of cervical region: Secondary | ICD-10-CM | POA: Diagnosis not present

## 2018-08-13 DIAGNOSIS — M7062 Trochanteric bursitis, left hip: Secondary | ICD-10-CM | POA: Diagnosis not present

## 2018-08-16 ENCOUNTER — Ambulatory Visit: Payer: PPO | Admitting: Family Medicine

## 2018-08-16 ENCOUNTER — Encounter: Payer: Self-pay | Admitting: Family Medicine

## 2018-08-16 ENCOUNTER — Ambulatory Visit: Payer: PPO | Admitting: Internal Medicine

## 2018-08-16 ENCOUNTER — Ambulatory Visit (INDEPENDENT_AMBULATORY_CARE_PROVIDER_SITE_OTHER): Payer: PPO | Admitting: Family Medicine

## 2018-08-16 VITALS — BP 110/60 | HR 68 | Temp 98.5°F | Ht 68.0 in | Wt 210.8 lb

## 2018-08-16 DIAGNOSIS — J101 Influenza due to other identified influenza virus with other respiratory manifestations: Secondary | ICD-10-CM | POA: Diagnosis not present

## 2018-08-16 DIAGNOSIS — R509 Fever, unspecified: Secondary | ICD-10-CM

## 2018-08-16 LAB — POC INFLUENZA A&B (BINAX/QUICKVUE)
Influenza A, POC: POSITIVE — AB
Influenza B, POC: NEGATIVE

## 2018-08-16 MED ORDER — OSELTAMIVIR PHOSPHATE 75 MG PO CAPS: 75.0000 mg | ORAL_CAPSULE | Freq: Two times a day (BID) | ORAL | 0 refills | Status: AC

## 2018-08-16 NOTE — Progress Notes (Deleted)
[facility] Consultation      Date: 08/16/2018,   MRN# [id number] Randall Ali. 1940/08/15 Code Status:  Code Status History    Date Active Date Inactive Code Status Order ID Comments User Context   01/24/2015  2:11 PM 02/06/2015 11:28 PM Full Code [id number]  Coolidge Breeze, PA-C Inpatient   01/18/2015 12:22 PM 01/18/2015  8:09 PM Full Code [id number]  [name], MD Inpatient   01/05/2015  9:01 PM 01/06/2015  5:21 PM Full Code [id number]  Baxter Hire, MD Inpatient        CHIEF COMPLAINT:  Follow up OSA follo  HISTORY OF PRESENT ILLNESS   78 year old pleasant white male seen today for follow-up of sleep apnea Currently on BiPAP therapy IPAP 20 EPAP 10 Compliance report shows 93 percent for days Greater than 4 hours is 90% Patient has minimal leak AHI is down to 2.4   No signs of infection at this time Has intermittent fatigue and tiredness throughout the day  Patient has a hernia right now that is bothering him He is a former smoker 3 to 4 pack a day for 50 years quit in 2001 CABG 2016  No acute respiratory issues at this time No shortness of breath dyspnea on exertion   Overall patient doing well on BiPAP therapy   CT chest November 20, 2016 Shows right upper lobe nodule approximately 6.9 mm which has not changed since 2016 Patient has known about this right upper lobe nodule for the last 6 years After further evaluation right upper lobe lung nodule is benign      Current Medication:  Current Outpatient Medications:  .  aspirin 81 MG EC tablet, Take 81 mg by mouth daily.  , Disp: , Rfl:  .  Calcium Carbonate-Vitamin D (CALCIUM-VITAMIN D) 600-125 MG-UNIT TABS, Take 1 tablet by mouth daily., Disp: , Rfl:  .  cholecalciferol (VITAMIN D) 1000 units tablet, Take 1,000 Units by mouth daily., Disp: , Rfl:  .  ibuprofen (ADVIL,MOTRIN) 200 MG tablet, Take 800 mg by mouth every 6 (six) hours as needed for moderate pain., Disp: , Rfl:  .   lisinopril (PRINIVIL,ZESTRIL) 5 MG tablet, Take 1 tablet (5 mg total) by mouth daily., Disp: 90 tablet, Rfl: 1 .  metoprolol succinate (TOPROL XL) 25 MG 24 hr tablet, Take 0.5 tablets (12.5 mg total) by mouth daily., Disp: 45 tablet, Rfl: 3 .  nitroGLYCERIN (NITROSTAT) 0.4 MG SL tablet, Place 1 tablet (0.4 mg total) under the tongue every 5 (five) minutes as needed for chest pain., Disp: 25 tablet, Rfl: 3 .  omeprazole (PRILOSEC) 40 MG capsule, TAKE 1 CAPSULE BY MOUTH EVERY DAY, Disp: 90 capsule, Rfl: 0 .  simvastatin (ZOCOR) 40 MG tablet, Take 1 tablet (40 mg total) by mouth at bedtime., Disp: 90 tablet, Rfl: 1 .  tamsulosin (FLOMAX) 0.4 MG CAPS capsule, Take 1 capsule (0.4 mg total) by mouth daily after supper., Disp: 30 capsule, Rfl: 6 .  traMADol (ULTRAM) 50 MG tablet, TAKE 1 TABLET BY MOUTH THREE TIMES DAILY, Disp: 90 tablet, Rfl: 0 .  vitamin B-12 (CYANOCOBALAMIN) 1000 MCG tablet, Take 1,000 mcg by mouth daily., Disp: , Rfl:  .  vitamin C (ASCORBIC ACID) 500 MG tablet, Take 500 mg by mouth daily., Disp: , Rfl:  .  Zinc 100 MG TABS, Take 1 tablet by mouth daily.  , Disp: , Rfl:     ALLERGIES   Patient has no known allergies.  REVIEW OF SYSTEMS   Review of Systems  Constitutional: Positive for malaise/fatigue. Negative for chills, diaphoresis, fever and weight loss.  HENT: Negative for congestion and hearing loss.   Respiratory: Negative for cough, hemoptysis, sputum production, shortness of breath and wheezing.   Cardiovascular: Negative for chest pain, palpitations and orthopnea.  Gastrointestinal: Negative for heartburn.  Skin: Negative for rash.  Neurological: Negative for weakness.  All other systems reviewed and are negative.  There were no vitals taken for this visit.    PHYSICAL EXAM  Physical Exam  Constitutional: He is oriented to person, place, and time. He appears well-developed and well-nourished. No distress.  Eyes: No scleral icterus.  Neck: Neck supple.    Cardiovascular: Normal rate, regular rhythm and normal heart sounds.  No murmur heard. Pulmonary/Chest: Effort normal and breath sounds normal. No stridor. No respiratory distress. He has no wheezes.  Musculoskeletal: Normal range of motion.        General: No edema.  Neurological: He is alert and oriented to person, place, and time. No cranial nerve deficit.  Skin: Skin is warm.  Psychiatric: He has a normal mood and affect.        IMAGING    CT chest November 20, 2016 I have Independently reviewed images of  CT chest   Interpretation:6.9 MM RUL nodule noted Has not change in size of the last 2 years    ASSESSMENT/PLAN    78 year old pleasant white male seen today for follow-up of sleep apnea in the setting of a right upper lobe lung nodule which is noted to be benign associated with obesity and deconditioned state  #1 OSA Patient is benefiting and using BiPAP therapy nightly No issues with mask at this time Wears a full facemask His fatigue and sleepiness is not related to his underlying sleep apnea because it is treated  #2 right upper lobe lung nodule Has not changed in size for the last 2 years this is benign in nature no further CT scan needed    #3 obesity -recommend significant weight loss -recommend changing diet  #4 deconditioned state -Recommend increased daily activity and exercise  #5 patient would like to get general surgery consultation for hernia   Patient satisfied with visit Patient uses and benefits BiPAP therapy for his CPAP  Deep Ashby Dawes, M.D., F.C.C.P.  Board Certified in Internal Medicine, Pulmonary Medicine, [place], and Sleep Medicine.  [facility] Office Number: [phone]

## 2018-08-16 NOTE — Progress Notes (Signed)
Dr. Frederico Hamman T. Cecia Egge, MD, [facility] [place], [address] Phone: [phone] Fax: [phone]  08/16/2018  Patient: Randall Ali., MRN: [id number], DOB: [birth year], 78 y.o.  Primary Physician:  Ria Bush, MD   Chief Complaint  Patient presents with  . Fever  . Cough    with chest congesiton  . Headache  . Shortness of Breath   Subjective:   [name] presents with runny nose, sneezing, cough, sore throat, malaise, myalgias, arthralgia, chills, and fever.  Started about on Saturday  Unclear recent exposure to others with similar symptoms.   The patent denies sore throat as the primary complaint. Denies sthortness of breath/wheezing, otalgia, facial pain, abdominal pain, changes in bowel or bladder.  Generally feels terrible  Tmax: 102 yesterday  PMH, PHS, Allergies, Problem List, Medications, Family History, and Social History have all been reviewed.  Patient Active Problem List   Diagnosis Date Noted  . Generalized abdominal pain 02/04/2018  . Hypotension 12/15/2017  . Chronic back pain 09/09/2017  . Subclavian artery stenosis, right ([place]) 09/09/2017  . BPH (benign prostatic hyperplasia) 08/24/2017  . Health maintenance examination 07/28/2016  . Advanced care planning/counseling discussion 07/28/2016  . Hearing loss 07/28/2016  . History of esophagitis   . Paroxysmal atrial fibrillation ([place]) 03/27/2015  . Infection due to Enterobacter aerogenes 02/02/2015  . Status post right foot surgery 02/02/2015  . 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  . History of coronary artery stent placement   . OSA (obstructive sleep apnea) 01/13/2015  . Coronary artery disease involving native coronary artery of native heart with angina pectoris with documented spasm ([place])   . Hyperlipidemia   . Essential hypertension   . Chest pain 01/05/2015  . Hepatic steatosis 11/24/2014  . PAD (peripheral artery  disease) ([place]) 04/11/2014  . Chronic daily headache 04/11/2014  . Chronic fatigue and malaise 01/11/2014  . Diastasis recti 05/19/2013  . H/O arthroplasty 05/19/2013  . Medicare annual wellness visit, subsequent 02/27/2012  . Erectile dysfunction 02/27/2012  . Pulmonary nodule 12/24/2011  . CAD, NATIVE VESSEL 03/08/2009    Past Medical History:  Diagnosis Date  . Allergy    seasonal  . Arthritis    all over- in general   . CAD (coronary artery disease)    a. inferior wall MI 10/01 s/p PCI/DES to RCA; b. Myoview 3/16 neg for ischemia; c. LHC 8/16: ostLAD 80%, OM1 70%, OM2 70% x 2 lesions, mRCA 30%, dRCA 70% s/p 4-V CABG 01/24/15 (LIMA-LAD, VG- OM1, VG-OM2, VG-PDA)   . Cancer (HCC)    skin, melanoma  . Carotid artery disease ([place])    a. [place] 8/16: 1-39% bilateral ICA stenosis  . Cataract    removed  . Diastolic dysfunction    a. TTE 8/16: EF 55-60%, no RWMA, Gr1DD, calcified mitral annulus, mild biatrial enlargement  . Erectile dysfunction   . History of elbow surgery   . HLD (hyperlipidemia)   . HTN (hypertension)   . Inferior myocardial infarction ([place]) 10/01   stent RCA  . Postoperative wound infection 02/02/2015  . Reflux esophagitis   . Sleep apnea 2017   CPAP at night    Past Surgical History:  Procedure Laterality Date  . arm surgery  2010  . CARDIAC CATHETERIZATION  06/24/11  . CARDIAC CATHETERIZATION N/A 01/18/2015   Procedure: Left Heart Cath with coronary angiography;  Surgeon: Minna Merritts, MD;  Location: [facility];  Service: Cardiovascular;  Laterality: N/A;  . CARDIAC CATHETERIZATION N/A 01/18/2015   Procedure: Intravascular Pressure Wire/FFR Study;  Surgeon: [name], MD;  Location: [facility];  Service: Cardiovascular;  Laterality: N/A;  . CAROTID STENT  03/10/2011  . COLONOSCOPY  2010  . COLONOSCOPY  06/14/2014   Dr Hilarie Fredrickson  . CORONARY ARTERY BYPASS GRAFT N/A 01/24/2015   Procedure: CORONARY ARTERY BYPASS GRAFTING x 4  (LIMA-LAD, SVG-Int 1- Int 2, SVG-PD) ENDOSCOPIC GREATER SAPHENOUS VEIN HARVEST LEFT LEG;  Surgeon: [name], MD;  Location: [facility];  Service: Open Heart Surgery;  Laterality: N/A;  . ESOPHAGOGASTRODUODENOSCOPY (EGD) WITH PROPOFOL N/A 04/24/2016   Procedure: ESOPHAGOGASTRODUODENOSCOPY (EGD) WITH PROPOFOL;  Surgeon: Jerene Bears, MD;  Location: [facility];  Service: Gastroenterology;  Laterality: N/A;  . EYE SURGERY     lasik 15 yrs. ago, cataracts removed - both eyes   . HAMMER TOE SURGERY     right toe  . LEFT HEART CATH AND CORONARY ANGIOGRAPHY Left 06/10/2017   Procedure: LEFT HEART CATH AND CORONARY ANGIOGRAPHY;  Surgeon: Minna Merritts, MD;  Location: [facility];  Service: Cardiovascular;  Laterality: Left;  . NASAL SINUS SURGERY  2008   septpolasty, bilateral turbinate reduction  . SHOULDER ARTHROSCOPY  2012  . TEE WITHOUT CARDIOVERSION N/A 01/24/2015   Procedure: TRANSESOPHAGEAL ECHOCARDIOGRAM (TEE);  Surgeon: [name], MD;  Location: [facility];  Service: Open Heart Surgery;  Laterality: N/A;  . [place]  . UPPER GI ENDOSCOPY  07/2014, 04-24-16   Dr Raquel James  . WRIST SURGERY  2011    Social History   Socioeconomic History  . Marital status: Single    Spouse name: Not on file  . Number of children: Not on file  . Years of education: 49  . Highest education level: Not on file  Occupational History  . Occupation: retired    Comment: Agricultural consultant  Social Needs  . Financial resource strain: Not on file  . Food insecurity:    Worry: Not on file    Inability: Not on file  . Transportation needs:    Medical: Not on file    Non-medical: Not on file  Tobacco Use  . Smoking status: Former Smoker    Packs/day: 2.50    Years: 40.00    Pack years: 100.00    Types: Cigarettes    Last attempt to quit: 03/24/2000    Years since quitting: 18.4  . Smokeless tobacco: Never Used  Substance and Sexual Activity  . Alcohol use: Yes    Alcohol/week: 10.0  standard drinks    Types: 10 Shots of liquor per week  . Drug use: No  . Sexual activity: Yes  Lifestyle  . Physical activity:    Days per week: Not on file    Minutes per session: Not on file  . Stress: Not on file  Relationships  . Social connections:    Talks on phone: Not on file    Gets together: Not on file    Attends religious service: Not on file    Active member of club or organization: Not on file    Attends meetings of clubs or organizations: Not on file    Relationship status: Not on file  . Intimate partner violence:    Fear of current or ex partner: Not on file    Emotionally abused: Not on file    Physically abused: Not on file    Forced  sexual activity: Not on file  Other Topics Concern  . Not on file  Social History Narrative   Singled; lives with son and dog    Occ: retired, back part time at [employer];    Activity: gym 4-5d/wk   Diet: good water, fruits/vegetables daily   Caffeine Use-yes    Family History  Problem Relation Age of Onset  . Hypertension Mother   . Heart disease Mother   . Hypertension Father   . Diabetes Father   . Heart disease Brother 78  . Cancer Paternal Grandfather   . Colon cancer Neg Hx   . Prostate cancer Neg Hx   . Bladder Cancer Neg Hx   . Kidney cancer Neg Hx     No Known Allergies  Medication list reviewed and updated in full in [facility].  ROS as above, eating and drinking - tolerating PO. Urinating normally. No excessive vomitting or diarrhea.   Objective:   Blood pressure 110/60, pulse 68, temperature 98.5 F (36.9 C), temperature source Oral, height 5\' 8"  (1.727 m), weight 210 lb 12 oz (95.6 kg), SpO2 95 %.  Gen: WDWN, NAD; A & O x3, cooperative. Pleasant.Globally Non-toxic HEENT: Normocephalic and atraumatic. Throat clear, w/o exudate, R TM clear, L TM - good landmarks, No fluid present. rhinnorhea. No frontal or maxillary sinus T. MMM NECK: Anterior cervical  LAD is absent CV: RRR, No M/G/R, cap  refill <2 sec PULM: Breathing comfortably in no respiratory distress. no wheezing, crackles, rhonchi ABD: S,NT,ND,+BS. No HSM. No rebound. EXT: No c/c/e PSYCH: Friendly, good eye contact MSK: Nml gait  Results for orders placed or performed in visit on 08/16/18  POC Influenza A&B (Binax test)  Result Value Ref Range   Influenza A, POC Positive (A) Negative   Influenza B, POC Negative Negative    Assessment and Plan:   Influenza A  Fever, unspecified fever cause - Plan: POC Influenza A&B (Binax test)  [name] clinical exam and history is consistent with a diagnosis of influenza + flu test Tamiflu in a 78 year old, 75 mg po BID  Supportive care, fluids, cough medicines as needed, and anti-pyretics. Infection control emphasized, including OOW or school until AF 24 hours.  Follow-up: No follow-ups on file.  Orders Placed This Encounter  Procedures  . POC Influenza A&B (Binax test)    Signed,  Brok Stocking T. Jesslyn Viglione, MD   Patient's Medications  New Prescriptions   OSELTAMIVIR (TAMIFLU) 75 MG CAPSULE    Take 1 capsule (75 mg total) by mouth 2 (two) times daily for 5 days.  Previous Medications   ASPIRIN 81 MG EC TABLET    Take 81 mg by mouth daily.     CALCIUM CARBONATE-VITAMIN D (CALCIUM-VITAMIN D) 600-125 MG-UNIT TABS    Take 1 tablet by mouth daily.   CHOLECALCIFEROL (VITAMIN D) 1000 UNITS TABLET    Take 1,000 Units by mouth daily.   IBUPROFEN (ADVIL,MOTRIN) 200 MG TABLET    Take 800 mg by mouth every 6 (six) hours as needed for moderate pain.   LISINOPRIL (PRINIVIL,ZESTRIL) 5 MG TABLET    Take 1 tablet (5 mg total) by mouth daily.   METOPROLOL SUCCINATE (TOPROL XL) 25 MG 24 HR TABLET    Take 0.5 tablets (12.5 mg total) by mouth daily.   NITROGLYCERIN (NITROSTAT) 0.4 MG SL TABLET    Place 1 tablet (0.4 mg total) under the tongue every 5 (five) minutes as needed for chest pain.   OMEPRAZOLE (PRILOSEC)  40 MG CAPSULE    TAKE 1 CAPSULE BY MOUTH EVERY DAY   SIMVASTATIN  (ZOCOR) 40 MG TABLET    Take 1 tablet (40 mg total) by mouth at bedtime.   TAMSULOSIN (FLOMAX) 0.4 MG CAPS CAPSULE    Take 1 capsule (0.4 mg total) by mouth daily after supper.   TRAMADOL (ULTRAM) 50 MG TABLET    TAKE 1 TABLET BY MOUTH THREE TIMES DAILY   VITAMIN B-12 (CYANOCOBALAMIN) 1000 MCG TABLET    Take 1,000 mcg by mouth daily.   VITAMIN C (ASCORBIC ACID) 500 MG TABLET    Take 500 mg by mouth daily.   ZINC 100 MG TABS    Take 1 tablet by mouth daily.    Modified Medications   No medications on file  Discontinued Medications   No medications on file

## 2018-08-23 ENCOUNTER — Encounter: Payer: Self-pay | Admitting: Family Medicine

## 2018-08-23 ENCOUNTER — Ambulatory Visit (INDEPENDENT_AMBULATORY_CARE_PROVIDER_SITE_OTHER): Payer: PPO | Admitting: Family Medicine

## 2018-08-23 ENCOUNTER — Other Ambulatory Visit: Payer: Self-pay

## 2018-08-23 VITALS — BP 122/64 | HR 60 | Temp 97.8°F | Ht 68.0 in | Wt 209.0 lb

## 2018-08-23 DIAGNOSIS — R51 Headache: Secondary | ICD-10-CM | POA: Diagnosis not present

## 2018-08-23 DIAGNOSIS — G8929 Other chronic pain: Secondary | ICD-10-CM | POA: Insufficient documentation

## 2018-08-23 DIAGNOSIS — R519 Headache, unspecified: Secondary | ICD-10-CM

## 2018-08-23 MED ORDER — PREDNISONE 10 MG PO TABS: ORAL_TABLET | ORAL | 0 refills | Status: DC

## 2018-08-23 MED ORDER — FLUTICASONE PROPIONATE 50 MCG/ACT NA SUSP: 2.0000 | Freq: Every day | NASAL | 3 refills | Status: DC

## 2018-08-23 NOTE — Progress Notes (Signed)
BP 122/64 (BP Location: Left Arm, Patient Position: Sitting, Cuff Size: Normal)    Pulse 60    Temp 97.8 F (36.6 C) (Oral)    Ht 5\' 8"  (1.727 m)    Wt 209 lb (94.8 kg)    SpO2 97%    BMI 31.78 kg/m    CC: HA Subjective:    Patient ID: Randall Kung., male    DOB: 1940/07/27, 78 y.o.   MRN: [id number]  HPI: Randall Candelas. is a 78 y.o. male presenting on 08/23/2018 for Headache (C/o HA off and on about 2 wks. Thinks it may be sinus HA. Tried Advil, helpful. )   1 wk h/o headache worse with straining eyes. Feels it's sinus pressure related. Points deep to nose as area. Some PNdrainage and mild congestion. No fevers/chills, ear or tooth pain, ST, cough. No vision changes, nausea/vomiting, photo/phonophobia, neck pain. Treating HA with ibuprofen and advil without benefit.   No significant allergy history.   Last eye exam 1 mo ago - told all normal.   Recent influenza dx treated with tamiflu. Recovered from this.      Relevant past medical, surgical, family and social history reviewed and updated as indicated. Interim medical history since our last visit reviewed. Allergies and medications reviewed and updated. Outpatient Medications Prior to Visit  Medication Sig Dispense Refill   aspirin 81 MG EC tablet Take 81 mg by mouth daily.       Calcium Carbonate-Vitamin D (CALCIUM-VITAMIN D) 600-125 MG-UNIT TABS Take 1 tablet by mouth daily.     cholecalciferol (VITAMIN D) 1000 units tablet Take 1,000 Units by mouth daily.     ibuprofen (ADVIL,MOTRIN) 200 MG tablet Take 800 mg by mouth every 6 (six) hours as needed for moderate pain.     lisinopril (PRINIVIL,ZESTRIL) 5 MG tablet Take 1 tablet (5 mg total) by mouth daily. 90 tablet 1   metoprolol succinate (TOPROL XL) 25 MG 24 hr tablet Take 0.5 tablets (12.5 mg total) by mouth daily. 45 tablet 3   nitroGLYCERIN (NITROSTAT) 0.4 MG SL tablet Place 1 tablet (0.4 mg total) under the tongue every 5 (five) minutes as needed for  chest pain. 25 tablet 3   omeprazole (PRILOSEC) 40 MG capsule TAKE 1 CAPSULE BY MOUTH EVERY DAY 90 capsule 0   simvastatin (ZOCOR) 40 MG tablet Take 1 tablet (40 mg total) by mouth at bedtime. 90 tablet 1   tamsulosin (FLOMAX) 0.4 MG CAPS capsule Take 1 capsule (0.4 mg total) by mouth daily after supper. 30 capsule 6   traMADol (ULTRAM) 50 MG tablet TAKE 1 TABLET BY MOUTH THREE TIMES DAILY 90 tablet 0   vitamin B-12 (CYANOCOBALAMIN) 1000 MCG tablet Take 1,000 mcg by mouth daily.     vitamin C (ASCORBIC ACID) 500 MG tablet Take 500 mg by mouth daily.     Zinc 100 MG TABS Take 1 tablet by mouth daily.       No facility-administered medications prior to visit.      Per HPI unless specifically indicated in ROS section below Review of Systems Objective:    BP 122/64 (BP Location: Left Arm, Patient Position: Sitting, Cuff Size: Normal)    Pulse 60    Temp 97.8 F (36.6 C) (Oral)    Ht 5\' 8"  (1.727 m)    Wt 209 lb (94.8 kg)    SpO2 97%    BMI 31.78 kg/m   Wt Readings from Last 3 Encounters:  08/23/18 209  lb (94.8 kg)  08/16/18 210 lb 12 oz (95.6 kg)  06/22/18 211 lb (95.7 kg)    Physical Exam Vitals signs and nursing note reviewed.  Constitutional:      General: He is not in acute distress.    Appearance: Normal appearance. He is well-developed.  HENT:     Head: Normocephalic and atraumatic.     Right Ear: Hearing, tympanic membrane, ear canal and external ear normal.     Left Ear: Hearing, tympanic membrane, ear canal and external ear normal.     Nose: Mucosal edema (pallor) and congestion present. No rhinorrhea.     Right Sinus: No maxillary sinus tenderness or frontal sinus tenderness.     Left Sinus: No maxillary sinus tenderness or frontal sinus tenderness.     Mouth/Throat:     Mouth: Mucous membranes are moist.     Pharynx: Oropharynx is clear. Uvula midline. No oropharyngeal exudate or posterior oropharyngeal erythema.     Tonsils: No tonsillar abscesses.  Eyes:      General: No scleral icterus.    Conjunctiva/sclera: Conjunctivae normal.     Pupils: Pupils are equal, round, and reactive to light.  Neck:     Musculoskeletal: Normal range of motion and neck supple.  Cardiovascular:     Rate and Rhythm: Normal rate and regular rhythm.     Heart sounds: Normal heart sounds. No murmur.  Pulmonary:     Effort: Pulmonary effort is normal. No respiratory distress.     Breath sounds: No wheezing, rhonchi or rales.     Comments: Bibasilar crackles R>L Lymphadenopathy:     Cervical: No cervical adenopathy.  Skin:    General: Skin is warm and dry.     Findings: No rash.  Neurological:     Mental Status: He is alert.       Results for orders placed or performed in visit on 08/16/18  POC Influenza A&B (Binax test)  Result Value Ref Range   Influenza A, POC Positive (A) Negative   Influenza B, POC Negative Negative   Lab Results  Component Value Date   HGBA1C 5.6 08/24/2017   Assessment & Plan:   Problem List Items Addressed This Visit    Sinus headache - Primary    Anticipate allergic inflammation of nasal passage/sinuses contributing to current headache. This is after recent influenza illness. I don't see signs of bacterial infection at this time. rec continue flonase (states he's been using), nasal saline irrigation, and start low dose prednisone taper for ongoing inflammation. Red flags to seek further care reviewed.           Meds ordered this encounter  Medications   fluticasone (FLONASE) 50 MCG/ACT nasal spray    Sig: Place 2 sprays into both nostrils daily.    Dispense:  16 g    Refill:  3   predniSONE (DELTASONE) 10 MG tablet    Sig: Take three tablets for 3 days followed by two tablets for 3 days followed by one tablet for 3 days    Dispense:  18 tablet    Refill:  0   No orders of the defined types were placed in this encounter.   Follow up plan: Return if symptoms worsen or fail to improve.  Ria Bush, MD

## 2018-08-23 NOTE — Assessment & Plan Note (Signed)
Anticipate allergic inflammation of nasal passage/sinuses contributing to current headache. This is after recent influenza illness. I don't see signs of bacterial infection at this time. rec continue flonase (states he's been using), nasal saline irrigation, and start low dose prednisone taper for ongoing inflammation. Red flags to seek further care reviewed.

## 2018-08-23 NOTE — Patient Instructions (Addendum)
I think headache is coming from sinus inflammation and allergies - continue flonase and start nasal saline irrigation throughout the day May take prednisone course for inflammation.  Let us know if fever >101, worsening productive cough, or not improving

## 2018-08-25 DIAGNOSIS — Z85828 Personal history of other malignant neoplasm of skin: Secondary | ICD-10-CM | POA: Diagnosis not present

## 2018-08-25 DIAGNOSIS — C44311 Basal cell carcinoma of skin of nose: Secondary | ICD-10-CM | POA: Diagnosis not present

## 2018-08-25 DIAGNOSIS — Z08 Encounter for follow-up examination after completed treatment for malignant neoplasm: Secondary | ICD-10-CM | POA: Diagnosis not present

## 2018-08-25 DIAGNOSIS — Z85820 Personal history of malignant melanoma of skin: Secondary | ICD-10-CM | POA: Diagnosis not present

## 2018-08-25 DIAGNOSIS — L570 Actinic keratosis: Secondary | ICD-10-CM | POA: Diagnosis not present

## 2018-08-25 DIAGNOSIS — X32XXXA Exposure to sunlight, initial encounter: Secondary | ICD-10-CM | POA: Diagnosis not present

## 2018-08-25 DIAGNOSIS — D485 Neoplasm of uncertain behavior of skin: Secondary | ICD-10-CM | POA: Diagnosis not present

## 2018-08-30 DIAGNOSIS — G4733 Obstructive sleep apnea (adult) (pediatric): Secondary | ICD-10-CM | POA: Diagnosis not present

## 2018-08-31 ENCOUNTER — Other Ambulatory Visit: Payer: Self-pay | Admitting: Family Medicine

## 2018-08-31 NOTE — Telephone Encounter (Signed)
Name of Medication: Tramadol Name of Pharmacy: [facility] or Written Date and Quantity: 08/05/18, #90 Last Office Visit and Type: 08/23/18, acute Next Office Visit and Type: 09/13/18, CPE Pt 2 Last Controlled Substance Agreement Date: none Last UDS: 01/18/16

## 2018-09-02 NOTE — Telephone Encounter (Signed)
Eprescribed.

## 2018-09-06 ENCOUNTER — Encounter: Payer: Self-pay | Admitting: Family Medicine

## 2018-09-06 ENCOUNTER — Telehealth: Payer: Self-pay | Admitting: Family Medicine

## 2018-09-06 ENCOUNTER — Other Ambulatory Visit: Payer: Self-pay

## 2018-09-06 ENCOUNTER — Ambulatory Visit: Payer: PPO

## 2018-09-06 ENCOUNTER — Ambulatory Visit (INDEPENDENT_AMBULATORY_CARE_PROVIDER_SITE_OTHER): Payer: PPO | Admitting: Family Medicine

## 2018-09-06 DIAGNOSIS — R51 Headache: Secondary | ICD-10-CM | POA: Diagnosis not present

## 2018-09-06 DIAGNOSIS — R519 Headache, unspecified: Secondary | ICD-10-CM

## 2018-09-06 MED ORDER — AMOXICILLIN-POT CLAVULANATE 875-125 MG PO TABS: 1.0000 | ORAL_TABLET | Freq: Two times a day (BID) | ORAL | 0 refills | Status: AC

## 2018-09-06 NOTE — Telephone Encounter (Signed)
Best number [phone] Pt called wanting to get an appointment.  He stated he saw you a couple weeks ago.  He is still having headaches.  He thinks its allergies and wants referral  No  Web ex capability do you want phone visit/office

## 2018-09-06 NOTE — Progress Notes (Signed)
   Randall Ali. - 78 y.o. male  MRN [id number]  Date of Birth: 06-Apr-1941  PCP: Ria Bush, MD  This service was provided via telemedicine. Phone Visit performed on 09/06/2018    Rationale for phone visit along with limitations reviewed. Patient consented to telephone encounter.   Location of patient: home Location of provider: office, [place] [facility] Name of referring provider: N/A   Names of persons and role in encounter: Provider: Ria Bush, MD  Patient: Randall Ali.  Other: N/A   Time on call: 3:39 pm - 3:46 pm   Subjective: CC: HA HPI:  See prior note for details. Ongoing sinus pressure pain between both eyes ongoing for 3+ wks. No significant congestion, cough, rhinorrhea, sneezing, itchy or watery eyes. No significant production of mucous.   Flonase, nasal saline, prednisone taper did not help at all. Pharmacist put him on claritin without benefit either.   H/o sinus surgery Tami Ribas).  NASAL SINUS SURGERY 2008 - septoplasty, bilateral turbinate reduction   Objective/Observations:   No physical exam or vital signs collected unless specifically identified below.   There were no vitals taken for this visit.   Respiratory status: speaks in complete sentences without evident shortness of breath.   Assessment/Plan:  Sinus headache Ongoing for 3 wks now - no improvement despite conservative treatment - flonase, prednisone course, nasal saline irrigation, antihistamine. Anticipate deeper sinus infection will treat for bacterial sinusitis with 10d augmentin course. Update if not improving with treatment, consider return to ENT. Pt agrees with plan.    I discussed the assessment and treatment plan with the patient. The patient was provided an opportunity to ask questions and all were answered. The patient agreed with the plan and demonstrated an understanding of the instructions.  Lab Orders  No laboratory test(s) ordered today    Meds ordered this encounter  Medications  . amoxicillin-clavulanate (AUGMENTIN) 875-125 MG tablet    Sig: Take 1 tablet by mouth 2 (two) times daily for 10 days.    Dispense:  20 tablet    Refill:  0    The patient was advised to call back or seek an in-person evaluation if the symptoms worsen or if the condition fails to improve as anticipated.  Ria Bush, MD

## 2018-09-06 NOTE — Assessment & Plan Note (Addendum)
Ongoing for 3 wks now - no improvement despite conservative treatment - flonase, prednisone course, nasal saline irrigation, antihistamine. Anticipate deeper sinus infection will treat for bacterial sinusitis with 10d augmentin course. Update if not improving with treatment, consider return to ENT. Pt agrees with plan.

## 2018-09-06 NOTE — Telephone Encounter (Signed)
Would offer phone visit. thanks

## 2018-09-06 NOTE — Telephone Encounter (Signed)
3/30 APPOINTMENT

## 2018-09-09 DIAGNOSIS — M9902 Segmental and somatic dysfunction of thoracic region: Secondary | ICD-10-CM | POA: Diagnosis not present

## 2018-09-09 DIAGNOSIS — M9901 Segmental and somatic dysfunction of cervical region: Secondary | ICD-10-CM | POA: Diagnosis not present

## 2018-09-09 DIAGNOSIS — Z1231 Encounter for screening mammogram for malignant neoplasm of breast: Secondary | ICD-10-CM | POA: Diagnosis not present

## 2018-09-09 DIAGNOSIS — M62830 Muscle spasm of back: Secondary | ICD-10-CM | POA: Diagnosis not present

## 2018-09-09 DIAGNOSIS — M5033 Other cervical disc degeneration, cervicothoracic region: Secondary | ICD-10-CM | POA: Diagnosis not present

## 2018-09-13 ENCOUNTER — Encounter: Payer: PPO | Admitting: Family Medicine

## 2018-09-22 DIAGNOSIS — M9902 Segmental and somatic dysfunction of thoracic region: Secondary | ICD-10-CM | POA: Diagnosis not present

## 2018-09-22 DIAGNOSIS — M62830 Muscle spasm of back: Secondary | ICD-10-CM | POA: Diagnosis not present

## 2018-09-22 DIAGNOSIS — M9901 Segmental and somatic dysfunction of cervical region: Secondary | ICD-10-CM | POA: Diagnosis not present

## 2018-09-22 DIAGNOSIS — M5033 Other cervical disc degeneration, cervicothoracic region: Secondary | ICD-10-CM | POA: Diagnosis not present

## 2018-09-25 DIAGNOSIS — M62830 Muscle spasm of back: Secondary | ICD-10-CM | POA: Diagnosis not present

## 2018-09-25 DIAGNOSIS — M9901 Segmental and somatic dysfunction of cervical region: Secondary | ICD-10-CM | POA: Diagnosis not present

## 2018-09-25 DIAGNOSIS — M5033 Other cervical disc degeneration, cervicothoracic region: Secondary | ICD-10-CM | POA: Diagnosis not present

## 2018-09-25 DIAGNOSIS — M9902 Segmental and somatic dysfunction of thoracic region: Secondary | ICD-10-CM | POA: Diagnosis not present

## 2018-10-04 ENCOUNTER — Other Ambulatory Visit: Payer: Self-pay | Admitting: Family Medicine

## 2018-10-05 DIAGNOSIS — L600 Ingrowing nail: Secondary | ICD-10-CM | POA: Diagnosis not present

## 2018-10-05 DIAGNOSIS — M79674 Pain in right toe(s): Secondary | ICD-10-CM | POA: Diagnosis not present

## 2018-10-05 DIAGNOSIS — M216X1 Other acquired deformities of right foot: Secondary | ICD-10-CM | POA: Diagnosis not present

## 2018-10-07 NOTE — Telephone Encounter (Signed)
[place] CSRS reviewed.  Tramadol E prescribed

## 2018-10-08 ENCOUNTER — Encounter: Payer: Self-pay | Admitting: Family Medicine

## 2018-10-08 ENCOUNTER — Ambulatory Visit (INDEPENDENT_AMBULATORY_CARE_PROVIDER_SITE_OTHER): Payer: PPO | Admitting: Family Medicine

## 2018-10-08 VITALS — BP 124/70 | HR 86 | Ht 68.0 in | Wt 200.0 lb

## 2018-10-08 DIAGNOSIS — R42 Dizziness and giddiness: Secondary | ICD-10-CM

## 2018-10-08 NOTE — Assessment & Plan Note (Signed)
Anticipate benign peripheral cause like BPPV given quick resolution of vertigo. Discussed pathophysiology of BPV. Will send modified epley maneuver handout to perform at home. rec in-office evaluation if symptoms recur. Pt agrees with plan.

## 2018-10-08 NOTE — Progress Notes (Signed)
Virtual visit completed on the phone. Due to national recommendations of social distancing due to [place] 19, a virtual visit is felt to be most appropriate for this patient at this time. Interactive audio and video telecommunications were attempted between myself and Donterrius Santucci., however failed due to patient not having access to video capability. We continued and completed visit with audio only.   Patient location: home  Provider location: [place] at [facility], office  If any vitals were documented, they were collected by patient at home unless specified below   BP 124/70 (BP Location: Left Arm, Patient Position: Sitting, Cuff Size: Normal) Comment: at 10:45 AM  Pulse 86   Ht 5\' 8"  (1.727 m)   Wt 200 lb (90.7 kg)   BMI 30.41 kg/m    CC: dizzy episode Subjective:    Patient ID: Randall Ali., male    DOB: 13-Nov-1940, 78 y.o.   MRN: [id number]  HPI: Randall Ali. is a 78 y.o. male presenting on 10/08/2018 for Dizziness (C/o dizziness this morning. Episode happened about 3:00 AM when going to restroom. Says he's been fine since actually getting up for the day. )   Woke up at 3am this morning to go to the bathroom - marked dizziness that lasted 1-2 hours. When he woke up this morning at 6:30am symptoms had fully resolved. Room spinning dizziness described as vertigo. Didn't notice symptoms when laying supine in bed.    No nausea/vomiting, tinnitus, hearing changes. No headache.  Has never had vertigo in the past.  Noticing sinus headache returning. Previously resolved with augmentin course.   Ingrown toenail removed this week - no new meds with this.       Relevant past medical, surgical, family and social history reviewed and updated as indicated. Interim medical history since our last visit reviewed. Allergies and medications reviewed and updated. Outpatient Medications Prior to Visit  Medication Sig Dispense Refill  . aspirin 81 MG EC tablet Take 81 mg  by mouth daily.      . Calcium Carbonate-Vitamin D (CALCIUM-VITAMIN D) 600-125 MG-UNIT TABS Take 1 tablet by mouth daily.    . cholecalciferol (VITAMIN D) 1000 units tablet Take 1,000 Units by mouth daily.    . fluticasone (FLONASE) 50 MCG/ACT nasal spray Place 2 sprays into both nostrils daily. 16 g 3  . ibuprofen (ADVIL,MOTRIN) 200 MG tablet Take 800 mg by mouth every 6 (six) hours as needed for moderate pain.    [name] lisinopril (PRINIVIL,ZESTRIL) 5 MG tablet Take 1 tablet (5 mg total) by mouth daily. 90 tablet 1  . metoprolol succinate (TOPROL-XL) 25 MG 24 hr tablet TAKE 1/2 TABLET BY MOUTH EVERY DAY 45 tablet 0  . nitroGLYCERIN (NITROSTAT) 0.4 MG SL tablet Place 1 tablet (0.4 mg total) under the tongue every 5 (five) minutes as needed for chest pain. 25 tablet 3  . omeprazole (PRILOSEC) 40 MG capsule TAKE 1 CAPSULE BY MOUTH EVERY DAY 90 capsule 0  . simvastatin (ZOCOR) 40 MG tablet Take 1 tablet (40 mg total) by mouth at bedtime. 90 tablet 1  . tamsulosin (FLOMAX) 0.4 MG CAPS capsule Take 1 capsule (0.4 mg total) by mouth daily after supper. 30 capsule 6  . traMADol (ULTRAM) 50 MG tablet TAKE 1 TABLET BY MOUTH THREE TIMES DAILY 90 tablet 0  . vitamin B-12 (CYANOCOBALAMIN) 1000 MCG tablet Take 1,000 mcg by mouth daily.    . vitamin C (ASCORBIC ACID) 500 MG tablet Take 500 mg  by mouth daily.    . Zinc 100 MG TABS Take 1 tablet by mouth daily.      . predniSONE (DELTASONE) 10 MG tablet Take three tablets for 3 days followed by two tablets for 3 days followed by one tablet for 3 days 18 tablet 0   No facility-administered medications prior to visit.      Per HPI unless specifically indicated in ROS section below Review of Systems Objective:    BP 124/70 (BP Location: Left Arm, Patient Position: Sitting, Cuff Size: Normal) Comment: at 10:45 AM  Pulse 86   Ht 5\' 8"  (1.727 m)   Wt 200 lb (90.7 kg)   BMI 30.41 kg/m   Wt Readings from Last 3 Encounters:  10/08/18 200 lb (90.7 kg)  08/23/18  209 lb (94.8 kg)  08/16/18 210 lb 12 oz (95.6 kg)     Physical exam: Gen: alert, NAD, not ill appearing Pulm: speaks in complete sentences without increased work of breathing Psych: normal mood, normal thought content       Assessment & Plan:   Problem List Items Addressed This Visit    Vertigo - Primary    Anticipate benign peripheral cause like BPPV given quick resolution of vertigo. Discussed pathophysiology of BPV. Will send modified epley maneuver handout to perform at home. rec in-office evaluation if symptoms recur. Pt agrees with plan.           No orders of the defined types were placed in this encounter.  No orders of the defined types were placed in this encounter.   Follow up plan: Return if symptoms worsen or fail to improve.  Ria Bush, MD

## 2018-10-08 NOTE — Patient Instructions (Signed)
I think you had a type of vertigo called benign positional vertigo.  Do exercise mailed today. Let us know if ongoing trouble for in office evaluation.   Benign Positional Vertigo Vertigo is the feeling that you or your surroundings are moving when they are not. Benign positional vertigo is the most common form of vertigo. This is usually a harmless condition (benign). This condition is positional. This means that symptoms are triggered by certain movements and positions. This condition can be dangerous if it occurs while you are doing something that could cause harm to you or others. This includes activities such as driving or operating machinery. What are the causes? In many cases, the cause of this condition is not known. It may be caused by a disturbance in an area of the inner ear that helps your brain to sense movement and balance. This disturbance can be caused by:  Viral infection (labyrinthitis).  Head injury.  Repetitive motion, such as jumping, dancing, or running. What increases the risk? You are more likely to develop this condition if:  You are a woman.  You are 77 years of age or older. What are the signs or symptoms? Symptoms of this condition usually happen when you move your head or your eyes in different directions. Symptoms may start suddenly, and usually last for less than a minute. They include:  Loss of balance and falling.  Feeling like you are spinning or moving.  Feeling like your surroundings are spinning or moving.  Nausea and vomiting.  Blurred vision.  Dizziness.  Involuntary eye movement (nystagmus). Symptoms can be mild and cause only minor problems, or they can be severe and interfere with daily life. Episodes of benign positional vertigo may return (recur) over time. Symptoms may improve over time. How is this diagnosed? This condition may be diagnosed based on:  Your medical history.  Physical exam of the head, neck, and ears.  Tests,  such as: ? MRI. ? CT scan. ? Eye movement tests. Your health care provider may ask you to change positions quickly while he or she watches you for symptoms of benign positional vertigo, such as nystagmus. Eye movement may be tested with a variety of exams that are designed to evaluate or stimulate vertigo. ? An electroencephalogram (EEG). This records electrical activity in your brain. ? Hearing tests. You may be referred to a health care provider who specializes in ear, nose, and throat (ENT) problems (otolaryngologist) or a provider who specializes in disorders of the nervous system (neurologist). How is this treated?  This condition may be treated in a session in which your health care provider moves your head in specific positions to adjust your inner ear back to normal. Treatment for this condition may take several sessions. Surgery may be needed in severe cases, but this is rare. In some cases, benign positional vertigo may resolve on its own in 2-4 weeks. Follow these instructions at home: Safety  Move slowly. Avoid sudden body or head movements or certain positions, as told by your health care provider.  Avoid driving until your health care provider says it is safe for you to do so.  Avoid operating heavy machinery until your health care provider says it is safe for you to do so.  Avoid doing any tasks that would be dangerous to you or others if vertigo occurs.  If you have trouble walking or keeping your balance, try using a cane for stability. If you feel dizzy or unstable, sit down right away.  Return to your normal activities as told by your health care provider. Ask your health care provider what activities are safe for you. General instructions  Take over-the-counter and prescription medicines only as told by your health care provider.  Drink enough fluid to keep your urine pale yellow.  Keep all follow-up visits as told by your health care provider. This is important.  Contact a health care provider if:  You have a fever.  Your condition gets worse or you develop new symptoms.  Your family or friends notice any behavioral changes.  You have nausea or vomiting that gets worse.  You have numbness or a "pins and needles" sensation. Get help right away if you:  Have difficulty speaking or moving.  Are always dizzy.  Faint.  Develop severe headaches.  Have weakness in your legs or arms.  Have changes in your hearing or vision.  Develop a stiff neck.  Develop sensitivity to light. Summary  Vertigo is the feeling that you or your surroundings are moving when they are not. Benign positional vertigo is the most common form of vertigo.  The cause of this condition is not known. It may be caused by a disturbance in an area of the inner ear that helps your brain to sense movement and balance.  Symptoms include loss of balance and falling, feeling that you or your surroundings are moving, nausea and vomiting, and blurred vision.  This condition can be diagnosed based on symptoms, physical exam, and other tests, such as MRI, CT scan, eye movement tests, and hearing tests.  Follow safety instructions as told by your health care provider. You will also be told when to contact your health care provider in case of problems. This information is not intended to replace advice given to you by your health care provider. Make sure you discuss any questions you have with your health care provider. Document Released: 03/03/2006 Document Revised: 11/04/2017 Document Reviewed: 11/04/2017 Elsevier Interactive Patient Education  2019 [employer].

## 2018-10-19 ENCOUNTER — Encounter: Payer: Self-pay | Admitting: Family Medicine

## 2018-10-19 ENCOUNTER — Ambulatory Visit (INDEPENDENT_AMBULATORY_CARE_PROVIDER_SITE_OTHER): Payer: PPO | Admitting: Family Medicine

## 2018-10-19 VITALS — BP 142/69 | HR 82 | Temp 97.0°F | Ht 68.0 in | Wt 203.0 lb

## 2018-10-19 DIAGNOSIS — M79674 Pain in right toe(s): Secondary | ICD-10-CM | POA: Diagnosis not present

## 2018-10-19 DIAGNOSIS — L600 Ingrowing nail: Secondary | ICD-10-CM | POA: Diagnosis not present

## 2018-10-19 DIAGNOSIS — R51 Headache: Secondary | ICD-10-CM

## 2018-10-19 DIAGNOSIS — M216X1 Other acquired deformities of right foot: Secondary | ICD-10-CM | POA: Diagnosis not present

## 2018-10-19 DIAGNOSIS — R519 Headache, unspecified: Secondary | ICD-10-CM

## 2018-10-19 MED ORDER — AMOXICILLIN 875 MG PO TABS: 875.0000 mg | ORAL_TABLET | Freq: Two times a day (BID) | ORAL | 0 refills | Status: DC

## 2018-10-19 NOTE — Assessment & Plan Note (Signed)
Anticipate recurrent sinus headache that previously responded to augmentin 10d course, now with recurrence 3 wks later. rec restart allergy treatment (flonase, nasal saline, claritin). Will treat with 2 wk amoxicillin course, if recurrent or persistent, discussed ENT eval. Pt agrees with plan.  He states he recently had normal eye exam.

## 2018-10-19 NOTE — Progress Notes (Signed)
Virtual visit completed through [employer]. Due to national recommendations of social distancing due to [place] 19, a virtual visit is felt to be most appropriate for this patient at this time.   Patient location: home Provider location: [place] at [facility], office If any vitals were documented, they were collected by patient at home unless specified below.    BP (!) 142/69 (BP Location: Left Arm, Patient Position: Sitting)   Pulse 82   Temp (!) 97 F (36.1 C) (Oral)   Ht 5\' 8"  (1.727 m)   Wt 203 lb (92.1 kg)   BMI 30.87 kg/m    CC: HA Subjective:    Patient ID: Randall Kung., male    DOB: 07/06/1940, 78 y.o.   MRN: [id number]  HPI: Randall Bukhari. is a 78 y.o. male presenting on 10/19/2018 for Headache (C/o HA. Says it feels worse when leaning forward reading. Thinks allergy related. Was tx with abx in past that helped. )   3 wk h/o worsening frontal sinus pressure headache worse with leaning forward. Mild rhinorrhea. Ongoing fatigue. Notes more trouble when straining eyes. Last eye exam was a few months ago - normal exam. No red eyes. Last saw ENT several years ago.   Doesn't awaken with headache. No significant pain early in the mornings. No tinnitus, blurry vision or vision changes. Headache not exertional. Wearing new hearing aides. No significant worsening with straining or valsalva.   Allergy treatments haven't helped (flonase, nasal saline, claritin).   No fevers/chills, ear or tooth pain. No weight changes. No PNdrainage, ST, cough.   H/o sinus surgery Randall Ali).  NASAL SINUS SURGERY 2008 - septoplasty, bilateral turbinate reduction     Relevant past medical, surgical, family and social history reviewed and updated as indicated. Interim medical history since our last visit reviewed. Allergies and medications reviewed and updated. Outpatient Medications Prior to Visit  Medication Sig Dispense Refill  . aspirin 81 MG EC tablet Take 81 mg by mouth daily.       . Calcium Carbonate-Vitamin D (CALCIUM-VITAMIN D) 600-125 MG-UNIT TABS Take 1 tablet by mouth daily.    . cholecalciferol (VITAMIN D) 1000 units tablet Take 1,000 Units by mouth daily.    . fluticasone (FLONASE) 50 MCG/ACT nasal spray Place 2 sprays into both nostrils daily. 16 g 3  . ibuprofen (ADVIL,MOTRIN) 200 MG tablet Take 800 mg by mouth every 6 (six) hours as needed for moderate pain.    [name] lisinopril (PRINIVIL,ZESTRIL) 5 MG tablet Take 1 tablet (5 mg total) by mouth daily. 90 tablet 1  . metoprolol succinate (TOPROL-XL) 25 MG 24 hr tablet TAKE 1/2 TABLET BY MOUTH EVERY DAY 45 tablet 0  . nitroGLYCERIN (NITROSTAT) 0.4 MG SL tablet Place 1 tablet (0.4 mg total) under the tongue every 5 (five) minutes as needed for chest pain. 25 tablet 3  . omeprazole (PRILOSEC) 40 MG capsule TAKE 1 CAPSULE BY MOUTH EVERY DAY 90 capsule 0  . simvastatin (ZOCOR) 40 MG tablet Take 1 tablet (40 mg total) by mouth at bedtime. 90 tablet 1  . tamsulosin (FLOMAX) 0.4 MG CAPS capsule Take 1 capsule (0.4 mg total) by mouth daily after supper. 30 capsule 6  . traMADol (ULTRAM) 50 MG tablet TAKE 1 TABLET BY MOUTH THREE TIMES DAILY 90 tablet 0  . vitamin B-12 (CYANOCOBALAMIN) 1000 MCG tablet Take 1,000 mcg by mouth daily.    . vitamin C (ASCORBIC ACID) 500 MG tablet Take 500 mg by mouth daily.    [name]  Zinc 100 MG TABS Take 1 tablet by mouth daily.       No facility-administered medications prior to visit.      Per HPI unless specifically indicated in ROS section below Review of Systems Objective:    BP (!) 142/69 (BP Location: Left Arm, Patient Position: Sitting)   Pulse 82   Temp (!) 97 F (36.1 C) (Oral)   Ht 5\' 8"  (1.727 m)   Wt 203 lb (92.1 kg)   BMI 30.87 kg/m   Wt Readings from Last 3 Encounters:  10/19/18 203 lb (92.1 kg)  10/08/18 200 lb (90.7 kg)  08/23/18 209 lb (94.8 kg)     Physical exam: Gen: alert, NAD, not ill appearing Pulm: speaks in complete sentences without increased work of  breathing Psych: normal mood, normal thought content      Assessment & Plan:   Problem List Items Addressed This Visit    Sinus headache - Primary    Anticipate recurrent sinus headache that previously responded to augmentin 10d course, now with recurrence 3 wks later. rec restart allergy treatment (flonase, nasal saline, claritin). Will treat with 2 wk amoxicillin course, if recurrent or persistent, discussed ENT eval. Pt agrees with plan.  He states he recently had normal eye exam.           Meds ordered this encounter  Medications  . amoxicillin (AMOXIL) 875 MG tablet    Sig: Take 1 tablet (875 mg total) by mouth 2 (two) times daily.    Dispense:  28 tablet    Refill:  0   No orders of the defined types were placed in this encounter.   Follow up plan: Return if symptoms worsen or fail to improve.  Ria Bush, MD

## 2018-10-22 ENCOUNTER — Telehealth: Payer: Self-pay | Admitting: Cardiovascular Disease

## 2018-10-22 ENCOUNTER — Telehealth: Payer: Self-pay | Admitting: Nurse Practitioner

## 2018-10-22 DIAGNOSIS — I480 Paroxysmal atrial fibrillation: Secondary | ICD-10-CM

## 2018-10-22 NOTE — Telephone Encounter (Signed)
Patient c/o Palpitations:  High priority if patient c/o lightheadedness, shortness of breath, or chest pain  1) How long have you had palpitations/irregular HR/ Afib? Are you having the symptoms now? Started early this week and has noticed it about all this week - feels like heart is skipping a beat  2) Are you currently experiencing lightheadedness, SOB or CP? no  3) Do you have a history of afib (atrial fibrillation) or irregular heart rhythm? No, first time noticing    4) Have you checked your BP or HR? (document readings if available): HR 78 this morning   5) Are you experiencing any other symptoms?

## 2018-10-22 NOTE — Telephone Encounter (Signed)
In the absence of an ecg, it's hard to know what he's hearing, though based on his description, I suspect it's most likely a pac or pvc.  If he'd like to come for an ECG for reassurance next week, that's reasonable.

## 2018-10-22 NOTE — Telephone Encounter (Signed)
Call to patient. Order for EKG placed. [facility]. Pt agreed to seek medical attention if irregularity sustained, if he develops chest pain, SOB or any other concerning sx.   Called Barbara in scheduling to confirm appt.   Advised pt to call for any further questions or concerns.

## 2018-10-22 NOTE — Telephone Encounter (Signed)
Call to patient, he reports that he got new hearing aids last week and has since been able to hear his heartbeat.   He has noticed at rest that "he can hear a skip in beat, about every 30-35 beats".   Per Epic he has hx of "post op a fib", not currently on blood thinner.  No chest pain, SOB, fluttering in chest, LE swelling. He has been feeling well and been active going to the gym.   Vitals today BP 118/63, HR 77.  Routed to provider to advise.

## 2018-10-25 ENCOUNTER — Ambulatory Visit
Admission: RE | Admit: 2018-10-25 | Discharge: 2018-10-25 | Disposition: A | Discharge status: Home / Self Care | Payer: PPO | Source: Ambulatory Visit | Attending: Family Medicine | Admitting: Family Medicine

## 2018-10-25 ENCOUNTER — Other Ambulatory Visit: Payer: Self-pay

## 2018-10-25 ENCOUNTER — Telehealth: Payer: Self-pay | Admitting: Nurse Practitioner

## 2018-10-25 DIAGNOSIS — I10 Essential (primary) hypertension: Secondary | ICD-10-CM

## 2018-10-25 DIAGNOSIS — R079 Chest pain, unspecified: Secondary | ICD-10-CM | POA: Diagnosis not present

## 2018-10-25 DIAGNOSIS — M62830 Muscle spasm of back: Secondary | ICD-10-CM | POA: Diagnosis not present

## 2018-10-25 DIAGNOSIS — Z01810 Encounter for preprocedural cardiovascular examination: Secondary | ICD-10-CM | POA: Diagnosis not present

## 2018-10-25 DIAGNOSIS — R9431 Abnormal electrocardiogram [ECG] [EKG]: Secondary | ICD-10-CM | POA: Diagnosis not present

## 2018-10-25 DIAGNOSIS — M9902 Segmental and somatic dysfunction of thoracic region: Secondary | ICD-10-CM | POA: Diagnosis not present

## 2018-10-25 DIAGNOSIS — M9901 Segmental and somatic dysfunction of cervical region: Secondary | ICD-10-CM | POA: Diagnosis not present

## 2018-10-25 DIAGNOSIS — M5033 Other cervical disc degeneration, cervicothoracic region: Secondary | ICD-10-CM | POA: Diagnosis not present

## 2018-10-25 NOTE — Telephone Encounter (Signed)
Call to patient to review results of EKG.   Pt verbalized understanding and agreed to [facility]. Orders placed and procedure reviewed.   Advised pt to call for any further questions or concerns.

## 2018-10-25 NOTE — Telephone Encounter (Signed)
-----   Message from [place].Daleen Bo, RN sent at 10/25/2018  8:58 AM EDT ----- Triage call from Friday. Had EKG this am in hospital. See attached.

## 2018-10-25 NOTE — Telephone Encounter (Signed)
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  As previously stated, this will create that 'skip' that he's experiencing, and can also make for an irregular heart rhythm if someone is listening to his chest or feeling his pulse.  Isolated, asymptomatic PVC's are generally benign, esp in the setting of normal heart squeezing function (nl by echo 06/2017).  Would be reasonable to f/u a bmet, Mg, and TSH to look for other reversible causes.

## 2018-10-27 ENCOUNTER — Telehealth: Payer: Self-pay

## 2018-10-27 ENCOUNTER — Other Ambulatory Visit
Admission: RE | Admit: 2018-10-27 | Discharge: 2018-10-27 | Disposition: A | Discharge status: Home / Self Care | Payer: PPO | Source: Ambulatory Visit | Attending: Nurse Practitioner | Admitting: Nurse Practitioner

## 2018-10-27 ENCOUNTER — Other Ambulatory Visit: Payer: Self-pay

## 2018-10-27 DIAGNOSIS — I10 Essential (primary) hypertension: Secondary | ICD-10-CM

## 2018-10-27 LAB — BASIC METABOLIC PANEL
Anion gap: 9 (ref 5–15)
BUN: 24 mg/dL — ABNORMAL HIGH (ref 8–23)
CO2: 19 mmol/L — ABNORMAL LOW (ref 22–32)
Calcium: 9.2 mg/dL (ref 8.9–10.3)
Chloride: 108 mmol/L (ref 98–111)
Creatinine, Ser: 0.95 mg/dL (ref 0.61–1.24)
GFR calc Af Amer: >60 mL/min (ref >60)
GFR calc non Af Amer: >60 mL/min (ref >60)
Glucose, Bld: 103 mg/dL — ABNORMAL HIGH (ref 70–99)
Potassium: 4 mmol/L (ref 3.5–5.1)
Sodium: 136 mmol/L (ref 135–145)

## 2018-10-27 LAB — TSH: TSH: 1.565 u[IU]/mL (ref 0.350–4.500)

## 2018-10-27 LAB — MAGNESIUM: Magnesium: 1.9 mg/dL (ref 1.7–2.4)

## 2018-10-27 NOTE — Telephone Encounter (Signed)
-----   Message from Theora Gianotti, NP sent at 10/27/2018  9:25 AM EDT ----- TSH, lytes, kidney fxn ok.

## 2018-10-27 NOTE — Telephone Encounter (Signed)
DPR on file ok to leave detailed msg. lmom TSH, lytes, kidney fxn ok

## 2018-10-29 ENCOUNTER — Other Ambulatory Visit: Payer: Self-pay | Admitting: Cardiovascular Disease

## 2018-10-29 ENCOUNTER — Other Ambulatory Visit: Payer: Self-pay | Admitting: Family Medicine

## 2018-11-04 ENCOUNTER — Other Ambulatory Visit: Payer: Self-pay | Admitting: Family Medicine

## 2018-11-04 NOTE — Telephone Encounter (Signed)
Name of Medication: Tramadol Name of Pharmacy: [facility] or Written Date and Quantity: 10/07/18, #90 Last Office Visit and Type: 10/19/18, acute Next Office Visit and Type: 01/31/19, CPE Pt 2 Last Controlled Substance Agreement Date: 01/18/16 Last UDS: 01/18/16

## 2018-11-05 ENCOUNTER — Other Ambulatory Visit: Payer: Self-pay | Admitting: Surgery

## 2018-11-05 DIAGNOSIS — M9963 Osseous and subluxation stenosis of intervertebral foramina of lumbar region: Secondary | ICD-10-CM | POA: Insufficient documentation

## 2018-11-05 DIAGNOSIS — M47816 Spondylosis without myelopathy or radiculopathy, lumbar region: Secondary | ICD-10-CM | POA: Insufficient documentation

## 2018-11-05 DIAGNOSIS — I739 Peripheral vascular disease, unspecified: Secondary | ICD-10-CM | POA: Diagnosis not present

## 2018-11-05 DIAGNOSIS — R29898 Other symptoms and signs involving the musculoskeletal system: Secondary | ICD-10-CM | POA: Diagnosis not present

## 2018-11-05 DIAGNOSIS — M4807 Spinal stenosis, lumbosacral region: Secondary | ICD-10-CM | POA: Diagnosis not present

## 2018-11-05 NOTE — Telephone Encounter (Signed)
Eprescribed.

## 2018-11-10 ENCOUNTER — Ambulatory Visit (INDEPENDENT_AMBULATORY_CARE_PROVIDER_SITE_OTHER): Payer: PPO | Admitting: Family Medicine

## 2018-11-10 ENCOUNTER — Encounter: Payer: Self-pay | Admitting: Family Medicine

## 2018-11-10 VITALS — BP 128/59 | HR 80 | Temp 97.0°F | Ht 68.0 in | Wt 210.0 lb

## 2018-11-10 DIAGNOSIS — G4700 Insomnia, unspecified: Secondary | ICD-10-CM | POA: Insufficient documentation

## 2018-11-10 DIAGNOSIS — F5101 Primary insomnia: Secondary | ICD-10-CM

## 2018-11-10 NOTE — Assessment & Plan Note (Addendum)
Reviewed sleep hygiene measures. Seems to have good bedtime routine.  Tylenol PM ineffective.  rec trial melatonin 5-10 mg and update [place] with effect. If not helping, would try nortriptyline or imipramine hopeful to help chronic back pain.  Pt agrees with plan.

## 2018-11-10 NOTE — Progress Notes (Signed)
Virtual visit completed through [employer]Me. Due to national recommendations of social distancing due to COVID-19, a virtual visit is felt to be most appropriate for this patient at this time. Reviewed limitations of a virtual visit.   Patient location: home Provider location: [place] at [facility], office If any vitals were documented, they were collected by patient at home unless specified below.    BP (!) 128/59 (BP Location: Right Arm, Patient Position: Sitting)   Pulse 80   Temp (!) 97 F (36.1 C) (Oral)   Ht 5\' 8"  (1.727 m)   Wt 210 lb (95.3 kg)   BMI 31.93 kg/m    CC: insomnia Subjective:    Patient ID: Randall Kung., male    DOB: 03-14-1941, 78 y.o.   MRN: [id number]  HPI: Randall Ali. is a 78 y.o. male presenting on 11/10/2018 for Insomnia (C/o issues staying asleep. Started about 1 mo ago.  Tried Tylenol PM but does not like how he feels when waking up. )  3wk h/o trouble staying asleep. No trouble falling asleep, awakens at 1-2am.  Bedtime is 10pm.  Dinner is at 6pm. No caffeinated beverage in the evening, some caffeine free soda at supper.  Nocturia x1.  Drinks water in am, not in afternoon.  Has calm quiet dark environment to sleep at night.  No daytime naps.   Has tried tylenol PM, nyquil without benefit.  Ambien has previously helped him.      Relevant past medical, surgical, family and social history reviewed and updated as indicated. Interim medical history since our last visit reviewed. Allergies and medications reviewed and updated. Outpatient Medications Prior to Visit  Medication Sig Dispense Refill  . aspirin 81 MG EC tablet Take 81 mg by mouth daily.      . Calcium Carbonate-Vitamin D (CALCIUM-VITAMIN D) 600-125 MG-UNIT TABS Take 1 tablet by mouth daily.    . cholecalciferol (VITAMIN D) 1000 units tablet Take 1,000 Units by mouth daily.    . fluticasone (FLONASE) 50 MCG/ACT nasal spray Place 2 sprays into both nostrils daily. 16 g 3  .  ibuprofen (ADVIL,MOTRIN) 200 MG tablet Take 800 mg by mouth every 6 (six) hours as needed for moderate pain.    [name] lisinopril (ZESTRIL) 5 MG tablet TAKE 1 TABLET BY MOUTH EVERY DAY 90 tablet 1  . metoprolol succinate (TOPROL-XL) 25 MG 24 hr tablet TAKE 1/2 TABLET BY MOUTH EVERY DAY 45 tablet 0  . nitroGLYCERIN (NITROSTAT) 0.4 MG SL tablet Place 1 tablet (0.4 mg total) under the tongue every 5 (five) minutes as needed for chest pain. 25 tablet 3  . omeprazole (PRILOSEC) 40 MG capsule TAKE 1 CAPSULE BY MOUTH EVERY DAY 90 capsule 0  . simvastatin (ZOCOR) 40 MG tablet TAKE 1 TABLET(40 MG) BY MOUTH AT BEDTIME 90 tablet 0  . tamsulosin (FLOMAX) 0.4 MG CAPS capsule TAKE 1 CAPSULE BY MOUTH EVERY DAY AFTER SUPPER 90 capsule 1  . traMADol (ULTRAM) 50 MG tablet TAKE 1 TABLET BY MOUTH THREE TIMES DAILY 90 tablet 0  . vitamin B-12 (CYANOCOBALAMIN) 1000 MCG tablet Take 1,000 mcg by mouth daily.    . vitamin C (ASCORBIC ACID) 500 MG tablet Take 500 mg by mouth daily.    . Zinc 100 MG TABS Take 1 tablet by mouth daily.      [name] amoxicillin (AMOXIL) 875 MG tablet Take 1 tablet (875 mg total) by mouth 2 (two) times daily. 28 tablet 0   No facility-administered medications  prior to visit.      Per HPI unless specifically indicated in ROS section below Review of Systems Objective:    BP (!) 128/59 (BP Location: Right Arm, Patient Position: Sitting)   Pulse 80   Temp (!) 97 F (36.1 C) (Oral)   Ht 5\' 8"  (1.727 m)   Wt 210 lb (95.3 kg)   BMI 31.93 kg/m   Wt Readings from Last 3 Encounters:  11/10/18 210 lb (95.3 kg)  10/19/18 203 lb (92.1 kg)  10/08/18 200 lb (90.7 kg)     Physical exam: Gen: alert, NAD, not ill appearing Pulm: speaks in complete sentences without increased work of breathing Psych: normal mood, normal thought content      Assessment & Plan:   Problem List Items Addressed This Visit    Insomnia - Primary    Reviewed sleep hygiene measures. Seems to have good bedtime routine.   Tylenol PM ineffective.  rec trial melatonin 5-10 mg and update [place] with effect. If not helping, would try nortriptyline or imipramine hopeful to help chronic back pain.  Pt agrees with plan.           No orders of the defined types were placed in this encounter.  No orders of the defined types were placed in this encounter.   I discussed the assessment and treatment plan with the patient. The patient was provided an opportunity to ask questions and all were answered. The patient agreed with the plan and demonstrated an understanding of the instructions. The patient was advised to call back or seek an in-person evaluation if the symptoms worsen or if the condition fails to improve as anticipated.  Follow up plan: No follow-ups on file.  Ria Bush, MD

## 2018-11-18 ENCOUNTER — Telehealth: Payer: Self-pay

## 2018-11-18 NOTE — Telephone Encounter (Signed)
Pt called Triage Line stating he just found a tick on his leg. He pulled it off. Asking what he needed to do. I advised him to clean it well with soap and water several times a day. I also adviused him to look out for really bad headaches, rash on palm of hands or soles of his feet, bullseye rash, or flu-like symptoms. Please let him know if anything else should be done. [phone]

## 2018-11-19 ENCOUNTER — Other Ambulatory Visit: Payer: Self-pay

## 2018-11-19 ENCOUNTER — Ambulatory Visit
Admission: RE | Admit: 2018-11-19 | Discharge: 2018-11-19 | Disposition: A | Discharge status: Home / Self Care | Payer: PPO | Source: Ambulatory Visit | Attending: Surgery | Admitting: Surgery

## 2018-11-19 DIAGNOSIS — M48061 Spinal stenosis, lumbar region without neurogenic claudication: Secondary | ICD-10-CM | POA: Diagnosis not present

## 2018-11-19 DIAGNOSIS — M4807 Spinal stenosis, lumbosacral region: Secondary | ICD-10-CM

## 2018-11-19 NOTE — Telephone Encounter (Signed)
Spoke with about tick bite and relaying Dr. Synthia Innocent message.  Pt states he was bitten a couple of days ago but did remove the whole tick.  Pt verbalizes understanding of Dr. Synthia Innocent instructions.

## 2018-11-19 NOTE — Telephone Encounter (Signed)
Ensure tick fully removed.  Would check to see how long he thinks tick was attached. If >48 hours, would watch for fever/chill, joint pain, rash, abd pain, nausea, HA over next 7-10 days. If this happens, please let us know right away.

## 2018-11-23 DIAGNOSIS — M48062 Spinal stenosis, lumbar region with neurogenic claudication: Secondary | ICD-10-CM | POA: Diagnosis not present

## 2018-11-25 ENCOUNTER — Ambulatory Visit (INDEPENDENT_AMBULATORY_CARE_PROVIDER_SITE_OTHER): Payer: PPO

## 2018-11-25 ENCOUNTER — Other Ambulatory Visit: Payer: Self-pay

## 2018-11-25 ENCOUNTER — Other Ambulatory Visit (INDEPENDENT_AMBULATORY_CARE_PROVIDER_SITE_OTHER): Payer: Self-pay | Admitting: Vascular Surgery

## 2018-11-25 ENCOUNTER — Ambulatory Visit (INDEPENDENT_AMBULATORY_CARE_PROVIDER_SITE_OTHER): Payer: PPO | Admitting: Vascular Surgery

## 2018-11-25 ENCOUNTER — Encounter (INDEPENDENT_AMBULATORY_CARE_PROVIDER_SITE_OTHER): Payer: Self-pay | Admitting: Vascular Surgery

## 2018-11-25 VITALS — BP 126/69 | HR 69 | Resp 16 | Ht 68.0 in | Wt 210.2 lb

## 2018-11-25 DIAGNOSIS — I10 Essential (primary) hypertension: Secondary | ICD-10-CM | POA: Diagnosis not present

## 2018-11-25 DIAGNOSIS — Z87891 Personal history of nicotine dependence: Secondary | ICD-10-CM | POA: Diagnosis not present

## 2018-11-25 DIAGNOSIS — R0989 Other specified symptoms and signs involving the circulatory and respiratory systems: Secondary | ICD-10-CM

## 2018-11-25 DIAGNOSIS — M79605 Pain in left leg: Secondary | ICD-10-CM | POA: Diagnosis not present

## 2018-11-25 DIAGNOSIS — I739 Peripheral vascular disease, unspecified: Secondary | ICD-10-CM | POA: Diagnosis not present

## 2018-11-25 DIAGNOSIS — I6523 Occlusion and stenosis of bilateral carotid arteries: Secondary | ICD-10-CM | POA: Insufficient documentation

## 2018-11-25 DIAGNOSIS — I25111 Atherosclerotic heart disease of native coronary artery with angina pectoris with documented spasm: Secondary | ICD-10-CM

## 2018-11-25 DIAGNOSIS — I771 Stricture of artery: Secondary | ICD-10-CM

## 2018-11-25 DIAGNOSIS — I480 Paroxysmal atrial fibrillation: Secondary | ICD-10-CM | POA: Diagnosis not present

## 2018-11-25 DIAGNOSIS — Z79899 Other long term (current) drug therapy: Secondary | ICD-10-CM | POA: Diagnosis not present

## 2018-11-25 NOTE — Progress Notes (Signed)
MRN : [id number]  Randall Ali. is a 78 y.o. (07/12/1940) male who presents with chief complaint of  Chief Complaint  Patient presents with   New Patient (Initial Visit)    ref Poggi for eval claudication  .  History of Present Illness:    The patient is seen for evaluation of painful lower extremities and diminished pulses. Patient notes the pain is always associated with activity and is very consistent day today. Typically, the pain occurs at less than one block, progress is as activity continues to the point that the patient must stop walking. Resting including standing still for several minutes allowed resumption of the activity and the ability to walk a similar distance before stopping again. Uneven terrain and inclined shorten the distance. The pain has been progressive over the past several years. The patient states the inability to walk is now having a profound negative impact on quality of life and daily activities.  The patient denies rest pain or dangling of an extremity off the side of the bed during the night for relief. No open wounds or sores at this time. No prior interventions or surgeries.  No history of back problems or DJD of the lumbar sacral spine.   The patient denies changes in claudication symptoms or new rest pain symptoms.  No new ulcers or wounds of the foot.  [name] blood pressure has been stable and relatively well controlled. The patient denies amaurosis fugax or recent TIA symptoms. There are no recent neurological changes noted. The patient denies history of DVT, PE or superficial thrombophlebitis. The patient denies recent episodes of angina or shortness of breath.   ABI Rt=0.83 and Lt=0.75  Current Meds  Medication Sig   aspirin 81 MG EC tablet Take 81 mg by mouth daily.     Calcium Carbonate-Vitamin D (CALCIUM-VITAMIN D) 600-125 MG-UNIT TABS Take 1 tablet by mouth daily.   cholecalciferol (VITAMIN D) 1000 units tablet Take  1,000 Units by mouth daily.   fluticasone (FLONASE) 50 MCG/ACT nasal spray Place 2 sprays into both nostrils daily.   ibuprofen (ADVIL,MOTRIN) 200 MG tablet Take 800 mg by mouth every 6 (six) hours as needed for moderate pain.   lisinopril (ZESTRIL) 5 MG tablet TAKE 1 TABLET BY MOUTH EVERY DAY   metoprolol succinate (TOPROL-XL) 25 MG 24 hr tablet TAKE 1/2 TABLET BY MOUTH EVERY DAY   nitroGLYCERIN (NITROSTAT) 0.4 MG SL tablet Place 1 tablet (0.4 mg total) under the tongue every 5 (five) minutes as needed for chest pain.   omeprazole (PRILOSEC) 40 MG capsule TAKE 1 CAPSULE BY MOUTH EVERY DAY   simvastatin (ZOCOR) 40 MG tablet TAKE 1 TABLET(40 MG) BY MOUTH AT BEDTIME   tamsulosin (FLOMAX) 0.4 MG CAPS capsule TAKE 1 CAPSULE BY MOUTH EVERY DAY AFTER SUPPER   traMADol (ULTRAM) 50 MG tablet TAKE 1 TABLET BY MOUTH THREE TIMES DAILY   vitamin B-12 (CYANOCOBALAMIN) 1000 MCG tablet Take 1,000 mcg by mouth daily.   vitamin C (ASCORBIC ACID) 500 MG tablet Take 500 mg by mouth daily.   Zinc 100 MG TABS Take 1 tablet by mouth daily.      Past Medical History:  Diagnosis Date   Allergy    seasonal   Arthritis    all over- in general    CAD (coronary artery disease)    a. inferior wall MI 10/01 s/p PCI/DES to RCA; b. Myoview 3/16 neg for ischemia; c. LHC 8/16: ostLAD 80%, OM1 70%, OM2 70%  x 2 lesions, mRCA 30%, dRCA 70% s/p 4-V CABG 01/24/15 (LIMA-LAD, VG- OM1, VG-OM2, VG-PDA)    Cancer (HCC)    skin, melanoma   Carotid artery disease ([place])    a. [place] 8/16: 1-39% bilateral ICA stenosis   Cataract    removed   Diastolic dysfunction    a. 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   stent RCA   Postoperative wound infection 02/02/2015   Reflux esophagitis    Sleep apnea 2017   CPAP at  night    Past Surgical History:  Procedure Laterality Date   arm surgery  2010   CARDIAC CATHETERIZATION  06/24/11   CARDIAC CATHETERIZATION N/A 01/18/2015   Procedure: Left Heart Cath with coronary angiography;  Surgeon: Minna Merritts, MD;  Location: [facility];  Service: Cardiovascular;  Laterality: N/A;   CARDIAC CATHETERIZATION N/A 01/18/2015   Procedure: Intravascular Pressure Wire/FFR Study;  Surgeon: [name], MD;  Location: [facility];  Service: Cardiovascular;  Laterality: N/A;   CAROTID STENT  03/10/2011   COLONOSCOPY  2010   COLONOSCOPY  06/14/2014   Dr Hilarie Fredrickson   CORONARY ARTERY BYPASS GRAFT N/A 01/24/2015   Procedure: CORONARY ARTERY BYPASS GRAFTING x 4 (LIMA-LAD, SVG-Int 1- Int 2, SVG-PD) ENDOSCOPIC GREATER SAPHENOUS VEIN HARVEST LEFT LEG;  Surgeon: [name], MD;  Location: [facility];  Service: Open Heart Surgery;  Laterality: N/A;   ESOPHAGOGASTRODUODENOSCOPY (EGD) WITH PROPOFOL N/A 04/24/2016   Procedure: ESOPHAGOGASTRODUODENOSCOPY (EGD) WITH PROPOFOL;  Surgeon: Jerene Bears, MD;  Location: [facility];  Service: Gastroenterology;  Laterality: N/A;   EYE SURGERY     lasik 15 yrs. ago, cataracts removed - both eyes    HAMMER TOE SURGERY     right toe   LEFT HEART CATH AND CORONARY ANGIOGRAPHY Left 06/10/2017   Procedure: LEFT HEART CATH AND CORONARY ANGIOGRAPHY;  Surgeon: Minna Merritts, MD;  Location: [facility];  Service: Cardiovascular;  Laterality: Left;   NASAL SINUS SURGERY  2008   septpolasty, bilateral turbinate reduction   SHOULDER ARTHROSCOPY  2012   TEE WITHOUT CARDIOVERSION N/A 01/24/2015   Procedure: TRANSESOPHAGEAL ECHOCARDIOGRAM (TEE);  Surgeon: [name], MD;  Location: [facility];  Service: Open Heart Surgery;  Laterality: N/A;   TOE SURGERY  1994   UPPER GI ENDOSCOPY  07/2014, 04-24-16   Dr Raquel James   WRIST SURGERY  2011    Social History Social History   Tobacco Use   Smoking status:  Former Smoker    Packs/day: 2.50    Years: 40.00    Pack years: 100.00    Types: Cigarettes    Quit date: 03/24/2000    Years since quitting: 18.6   Smokeless tobacco: Never Used  Substance Use Topics   Alcohol use: Yes    Alcohol/week: 10.0 standard drinks    Types: 10 Shots of liquor per week   Drug use: No    Family History Family History  Problem Relation Age of Onset   Hypertension Mother    Heart disease Mother    Hypertension Father    Diabetes Father    Heart disease Brother 84   Cancer Paternal Grandfather    Colon cancer Neg Hx    Prostate cancer Neg Hx  Bladder Cancer Neg Hx    Kidney cancer Neg Hx   No family history of bleeding/clotting disorders, porphyria or autoimmune disease   No Known Allergies   REVIEW OF SYSTEMS (Negative unless checked)  Constitutional: [] Weight loss  [] Fever  [] Chills Cardiac: [] Chest pain   [] Chest pressure   [] Palpitations   [] Shortness of breath when laying flat   [] Shortness of breath with exertion. Vascular:  [x] Pain in legs with walking   [] Pain in legs at rest  [] History of DVT   [] Phlebitis   [] Swelling in legs   [] Varicose veins   [] Non-healing ulcers Pulmonary:   [] Uses home oxygen   [] Productive cough   [] Hemoptysis   [] Wheeze  [] COPD   [] Asthma Neurologic:  [] Dizziness   [] Seizures   [] History of stroke   [] History of TIA  [] Aphasia   [] Vissual changes   [] Weakness or numbness in arm   [] Weakness or numbness in leg Musculoskeletal:   [] Joint swelling   [] Joint pain   [] Low back pain Hematologic:  [] Easy bruising  [] Easy bleeding   [] Hypercoagulable state   [] Anemic Gastrointestinal:  [] Diarrhea   [] Vomiting  [x] Gastroesophageal reflux/heartburn   [] Difficulty swallowing. Genitourinary:  [] Chronic kidney disease   [] Difficult urination  [] Frequent urination   [] Blood in urine Skin:  [] Rashes   [] Ulcers  Psychological:  [] History of anxiety   []  History of major depression.  Physical Examination  Vitals:    11/25/18 1001  BP: 126/69  Pulse: 69  Resp: 16  Weight: 210 lb 3.2 oz (95.3 kg)  Height: 5\' 8"  (1.727 m)   Body mass index is 31.96 kg/m. Gen: WD/WN, NAD Head: [place]/AT, No temporalis wasting.  Ear/Nose/Throat: Hearing grossly intact, nares w/o erythema or drainage, poor dentition Eyes: PER, EOMI, sclera nonicteric.  Neck: Supple, no masses.  No bruit or JVD.  Pulmonary:  Good air movement, clear to auscultation bilaterally, no use of accessory muscles.  Cardiac: RRR, normal S1, S2, no Murmurs. Vascular:  Right carotid bruit Vessel Right Left  Radial Trace Palpable Palpable  Brachial Trace Palpable Palpable  Carotid Palpable Palpable  PT Trace Palpable Trace Palpable  DP Not Palpable Trace Palpable  Gastrointestinal: soft, non-distended. No guarding/no peritoneal signs.  Musculoskeletal: M/S 5/5 throughout.  No deformity or atrophy.  Neurologic: CN 2-12 intact. Pain and light touch intact in extremities.  Symmetrical.  Speech is fluent. Motor exam as listed above. Psychiatric: Judgment intact, Mood & affect appropriate for pt's clinical situation. Dermatologic: No rashes or ulcers noted.  No changes consistent with cellulitis. Lymph : No Cervical lymphadenopathy, no lichenification or skin changes of chronic lymphedema.  CBC Lab Results  Component Value Date   WBC 6.5 02/04/2018   HGB 14.9 02/04/2018   HCT 44.4 02/04/2018   MCV 91.4 02/04/2018   PLT 209.0 02/04/2018    BMET    Component Value Date/Time   NA 136 10/27/2018 0805   NA 138 01/15/2015 0956   K 4.0 10/27/2018 0805   CL 108 10/27/2018 0805   CO2 19 (L) 10/27/2018 0805   GLUCOSE 103 (H) 10/27/2018 0805   BUN 24 (H) 10/27/2018 0805   BUN 16 01/15/2015 0956   CREATININE 0.95 10/27/2018 0805   CALCIUM 9.2 10/27/2018 0805   GFRNONAA >60 10/27/2018 0805   GFRAA >60 10/27/2018 0805   CrCl cannot be calculated (Patient's most recent lab result is older than the maximum 21 days allowed.).  COAG Lab Results    Component Value Date   INR 0.94 06/04/2017  INR 1.34 01/24/2015   INR 1.14 01/22/2015    Radiology Mr Lumbar Spine Wo Contrast  Result Date: 11/19/2018 CLINICAL DATA:  Low back, bilateral hip and anterior thigh pain, left worse than right. Symptoms for 20 years. No known injury. EXAM: MRI LUMBAR SPINE WITHOUT CONTRAST TECHNIQUE: Multiplanar, multisequence MR imaging of the lumbar spine was performed. No intravenous contrast was administered. COMPARISON:  MRI lumbar spine 04/27/2018. FINDINGS: Segmentation:  Standard. Alignment: Facet degenerative change results in unchanged 0.2 cm anterolisthesis L4 on L5 and 0.1-0.2 cm anterolisthesis L5 on S1. Vertebrae: No fracture or focal lesion. Short pedicle length results in a somewhat congenitally narrow central canal, particularly at L3-4 and L4-5. Conus medullaris and cauda equina: Conus extends to the L1 level. Conus and cauda equina appear normal. Paraspinal and other soft tissues: Negative. Disc levels: T11-12 is imaged in the sagittal plane only. Minimal disc bulge without stenosis is identified. T12-L1: Minimal disc bulge and endplate spur.  No stenosis. L1-2: There is loss of disc space height with a shallow bulge and endplate spur. No stenosis. Facet degenerative disease noted. L2-3: Solidly fused.  The central canal and foramina are open. L3-4: Shallow disc bulge and endplate spur, ligamentum flavum thickening and facet arthropathy are again seen. There is mild to moderate central canal narrowing with some narrowing in the subarticular recesses. Mild left foraminal narrowing is also present. The right foramen is open. The appearance is unchanged given difference in angle of scanning on axial imaging. L4-5: Moderate facet arthropathy, bulky ligamentum flavum thickening and a shallow disc bulge are seen. Moderate central canal stenosis and mild narrowing in the subarticular recesses is identified. There is left worse than right foraminal narrowing. The  exiting left L4 root appears compressed between disc and osteophytosis off the facet. No change compared to the prior exam. L5-S1: Advanced facet degenerative change is present. Shallow disc bulge is seen and there is some ligamentum flavum thickening. There is some narrowing in the subarticular recesses, greater on the right. Moderate bilateral foraminal narrowing is again seen. IMPRESSION: No change in the appearance of the lumbar spine since the prior MRI. Mild to moderate central canal stenosis with some narrowing in the subarticular recesses at L3-4. Mild left foraminal narrowing is also present at this level. Moderate central canal stenosis and mild narrowing in the subarticular recesses at L4-5. The exiting left L4 root appears compressed in the foramen by disc and osteophytosis off the left facet. No change in moderate bilateral foraminal narrowing at L5-S1. Narrowing in the subarticular recesses at this level is worse on the right. Electronically Signed   By: Inge Rise M.D.   On: 11/19/2018 11:51   Vas [place] Burnard Bunting With/wo Tbi  Result Date: 11/25/2018 LOWER EXTREMITY DOPPLER STUDY Indications: Claudication.  Performing Technologist: Almira Coaster RVS  Examination Guidelines: A complete evaluation includes at minimum, Doppler waveform signals and systolic blood pressure reading at the level of bilateral brachial, anterior tibial, and posterior tibial arteries, when vessel segments are accessible. Bilateral testing is considered an integral part of a complete examination. Photoelectric Plethysmograph (PPG) waveforms and toe systolic pressure readings are included as required and additional duplex testing as needed. Limited examinations for reoccurring indications may be performed as noted.  ABI Findings: +---------+------------------+-----+--------+--------+  Right     Rt Pressure (mmHg) Index Waveform Comment   +---------+------------------+-----+--------+--------+  Brachial  113                                          +---------+------------------+-----+--------+--------+  ATA       109                0.79  biphasic           +---------+------------------+-----+--------+--------+  PTA       114                0.83  biphasic           +---------+------------------+-----+--------+--------+  Great Toe 99                 0.72  Normal             +---------+------------------+-----+--------+--------+ +---------+------------------+-----+--------+-------+  Left      Lt Pressure (mmHg) Index Waveform Comment  +---------+------------------+-----+--------+-------+  Brachial  138                                        +---------+------------------+-----+--------+-------+  ATA       95                 0.69  biphasic          +---------+------------------+-----+--------+-------+  PTA       104                0.75  biphasic          +---------+------------------+-----+--------+-------+  Great Toe 92                 0.67  Abnormal          +---------+------------------+-----+--------+-------+ +-------+-----------+-----------+------------+------------+  ABI/TBI Today's ABI Today's TBI Previous ABI Previous TBI  +-------+-----------+-----------+------------+------------+  Right   .83         .72                                    +-------+-----------+-----------+------------+------------+  Left    .75         .67                                    +-------+-----------+-----------+------------+------------+  Summary: Right: Resting right ankle-brachial index indicates mild right lower extremity arterial disease. The right toe-brachial index is normal. Left: Resting left ankle-brachial index indicates moderate left lower extremity arterial disease. The left toe-brachial index is abnormal.  *See table(s) above for measurements and observations.  Electronically signed by Hortencia Pilar MD on 11/25/2018 at 4:42:38 PM.   Final       Assessment/Plan 1. PAD (peripheral artery disease) (HCC) Recommend:  Patient should undergo  arterial duplex of the lower extremity ASAP because there has been a significant deterioration in the patient's lower extremity symptoms.  The patient states they are having increased pain and a marked decrease in the distance that they can walk.  The risks and benefits as well as the alternatives were discussed in detail with the patient.  All questions were answered.  Patient agrees to proceed and understands this could be a prelude to angiography and intervention.  The patient will follow up with me in the office to review the studies.  - VAS US AORTA/IVC/ILIACS; Future  2. Coronary artery disease involving native coronary artery of native heart with angina pectoris with documented spasm (HCC) Continue cardiac and antihypertensive medications as already ordered and  reviewed, no changes at this time.  Continue statin as ordered and reviewed, no changes at this time  Nitrates PRN for chest pain   3. Essential hypertension Continue antihypertensive medications as already ordered, these medications have been reviewed and there are no changes at this time.   4. Paroxysmal atrial fibrillation (HCC) Continue antiarrhythmia medications as already ordered, these medications have been reviewed and there are no changes at this time.  Continue anticoagulation as ordered by Cardiology Service   5. Subclavian artery stenosis, right (HCC) Right arm BP 25 points lower than left consistent with right arm arterial stenosis.  Patient is asymptomatic at this time and therefore I do not recommend angiography at this time point.  6. Bruit of right carotid artery I will obtain a carotid duplex to evaluate the degree of carotid stenosis - VAS US CAROTID; Future    Hortencia Pilar, MD  11/25/2018 8:38 PM

## 2018-11-29 ENCOUNTER — Other Ambulatory Visit: Payer: Self-pay | Admitting: Family Medicine

## 2018-11-30 DIAGNOSIS — C44311 Basal cell carcinoma of skin of nose: Secondary | ICD-10-CM | POA: Diagnosis not present

## 2018-11-30 DIAGNOSIS — L814 Other melanin hyperpigmentation: Secondary | ICD-10-CM | POA: Diagnosis not present

## 2018-11-30 DIAGNOSIS — L988 Other specified disorders of the skin and subcutaneous tissue: Secondary | ICD-10-CM | POA: Diagnosis not present

## 2018-11-30 DIAGNOSIS — L578 Other skin changes due to chronic exposure to nonionizing radiation: Secondary | ICD-10-CM | POA: Diagnosis not present

## 2018-11-30 DIAGNOSIS — L738 Other specified follicular disorders: Secondary | ICD-10-CM | POA: Diagnosis not present

## 2018-12-01 ENCOUNTER — Ambulatory Visit: Payer: PPO | Admitting: Urology

## 2018-12-01 NOTE — Telephone Encounter (Signed)
Name of Medication: Tramadol Name of Pharmacy: [facility] or Written Date and Quantity: 11/05/18, #90 Last Office Visit and Type: 11/10/18, acute Next Office Visit and Type: 01/31/19, CPE Pt 2 Last Controlled Substance Agreement Date: none Last UDS: 01/18/16

## 2018-12-02 ENCOUNTER — Ambulatory Visit: Payer: PPO

## 2018-12-02 NOTE — Telephone Encounter (Signed)
Eprescribed.

## 2018-12-14 DIAGNOSIS — M9902 Segmental and somatic dysfunction of thoracic region: Secondary | ICD-10-CM | POA: Diagnosis not present

## 2018-12-14 DIAGNOSIS — M5033 Other cervical disc degeneration, cervicothoracic region: Secondary | ICD-10-CM | POA: Diagnosis not present

## 2018-12-14 DIAGNOSIS — M62830 Muscle spasm of back: Secondary | ICD-10-CM | POA: Diagnosis not present

## 2018-12-14 DIAGNOSIS — M9901 Segmental and somatic dysfunction of cervical region: Secondary | ICD-10-CM | POA: Diagnosis not present

## 2018-12-16 DIAGNOSIS — M9901 Segmental and somatic dysfunction of cervical region: Secondary | ICD-10-CM | POA: Diagnosis not present

## 2018-12-16 DIAGNOSIS — M5033 Other cervical disc degeneration, cervicothoracic region: Secondary | ICD-10-CM | POA: Diagnosis not present

## 2018-12-16 DIAGNOSIS — M62830 Muscle spasm of back: Secondary | ICD-10-CM | POA: Diagnosis not present

## 2018-12-16 DIAGNOSIS — M9902 Segmental and somatic dysfunction of thoracic region: Secondary | ICD-10-CM | POA: Diagnosis not present

## 2018-12-18 DIAGNOSIS — M9901 Segmental and somatic dysfunction of cervical region: Secondary | ICD-10-CM | POA: Diagnosis not present

## 2018-12-18 DIAGNOSIS — M62830 Muscle spasm of back: Secondary | ICD-10-CM | POA: Diagnosis not present

## 2018-12-18 DIAGNOSIS — M5033 Other cervical disc degeneration, cervicothoracic region: Secondary | ICD-10-CM | POA: Diagnosis not present

## 2018-12-18 DIAGNOSIS — M9902 Segmental and somatic dysfunction of thoracic region: Secondary | ICD-10-CM | POA: Diagnosis not present

## 2018-12-21 ENCOUNTER — Other Ambulatory Visit: Payer: Self-pay

## 2018-12-21 ENCOUNTER — Encounter (INDEPENDENT_AMBULATORY_CARE_PROVIDER_SITE_OTHER): Payer: Self-pay | Admitting: Nurse Practitioner

## 2018-12-21 ENCOUNTER — Telehealth (INDEPENDENT_AMBULATORY_CARE_PROVIDER_SITE_OTHER): Payer: Self-pay

## 2018-12-21 ENCOUNTER — Ambulatory Visit (INDEPENDENT_AMBULATORY_CARE_PROVIDER_SITE_OTHER): Payer: PPO

## 2018-12-21 ENCOUNTER — Ambulatory Visit (INDEPENDENT_AMBULATORY_CARE_PROVIDER_SITE_OTHER): Payer: PPO | Admitting: Nurse Practitioner

## 2018-12-21 ENCOUNTER — Encounter: Payer: Self-pay | Admitting: Family Medicine

## 2018-12-21 ENCOUNTER — Ambulatory Visit (INDEPENDENT_AMBULATORY_CARE_PROVIDER_SITE_OTHER): Payer: PPO | Admitting: Family Medicine

## 2018-12-21 ENCOUNTER — Encounter (INDEPENDENT_AMBULATORY_CARE_PROVIDER_SITE_OTHER): Payer: Self-pay

## 2018-12-21 VITALS — BP 148/74 | HR 68 | Resp 16 | Ht 68.0 in | Wt 207.0 lb

## 2018-12-21 VITALS — BP 116/57 | HR 79 | Temp 97.6°F | Ht 68.0 in | Wt 207.0 lb

## 2018-12-21 DIAGNOSIS — I6523 Occlusion and stenosis of bilateral carotid arteries: Secondary | ICD-10-CM | POA: Diagnosis not present

## 2018-12-21 DIAGNOSIS — E782 Mixed hyperlipidemia: Secondary | ICD-10-CM

## 2018-12-21 DIAGNOSIS — Z87891 Personal history of nicotine dependence: Secondary | ICD-10-CM | POA: Diagnosis not present

## 2018-12-21 DIAGNOSIS — R51 Headache: Secondary | ICD-10-CM

## 2018-12-21 DIAGNOSIS — R519 Headache, unspecified: Secondary | ICD-10-CM

## 2018-12-21 DIAGNOSIS — I739 Peripheral vascular disease, unspecified: Secondary | ICD-10-CM

## 2018-12-21 DIAGNOSIS — Z79899 Other long term (current) drug therapy: Secondary | ICD-10-CM | POA: Diagnosis not present

## 2018-12-21 DIAGNOSIS — Z7902 Long term (current) use of antithrombotics/antiplatelets: Secondary | ICD-10-CM | POA: Diagnosis not present

## 2018-12-21 DIAGNOSIS — R0989 Other specified symptoms and signs involving the circulatory and respiratory systems: Secondary | ICD-10-CM | POA: Diagnosis not present

## 2018-12-21 DIAGNOSIS — I10 Essential (primary) hypertension: Secondary | ICD-10-CM

## 2018-12-21 DIAGNOSIS — Z7982 Long term (current) use of aspirin: Secondary | ICD-10-CM

## 2018-12-21 MED ORDER — LORATADINE 10 MG PO TABS: 10.0000 mg | ORAL_TABLET | Freq: Every day | ORAL | Status: DC

## 2018-12-21 NOTE — Progress Notes (Signed)
SUBJECTIVE:  Patient ID: Randall Kung., male    DOB: 22-Jul-1940, 78 y.o.   MRN: [id number] Chief Complaint  Patient presents with  . Follow-up    ultrasound    HPI  Randall Ali. is a 78 y.o. male that presents today for a carotid artery duplex after a right carotid bruit was detected as well as noninvasive studies for leg pain.  The patient notes interval shortening of their claudication distance and development of mild rest pain symptoms. No new ulcers or wounds.  There have been no significant changes to the patient's overall health care.  The patient denies amaurosis fugax or recent TIA symptoms. There are no recent neurological changes noted. The patient denies history of DVT, PE or superficial thrombophlebitis. The patient denies recent episodes of angina or shortness of breath.   The patient underwent an aortoiliac duplex which revealed an aorta measuring 2.0 cm.  The left common and external iliac arteries had monophasic waveforms.  The right common and external iliac arteries had biphasic waveforms.  The patient denies amaurosis fugax. There is no recent history of TIA symptoms or focal motor deficits. There is no prior documented CVA.  The patient is taking enteric-coated aspirin 81 mg daily.  There is no history of migraine headaches. There is no history of seizures.  Duplex ultrasound shows 40-59% stenosis on right and 1-39% on left.    Past Medical History:  Diagnosis Date  . Allergy    seasonal  . Arthritis    all over- in general   . CAD (coronary artery disease)    a. inferior wall MI 10/01 s/p PCI/DES to RCA; b. Myoview 3/16 neg for ischemia; c. LHC 8/16: ostLAD 80%, OM1 70%, OM2 70% x 2 lesions, mRCA 30%, dRCA 70% s/p 4-V CABG 01/24/15 (LIMA-LAD, VG- OM1, VG-OM2, VG-PDA)   . Cancer (HCC)    skin, melanoma  . Carotid artery disease ([place])    a. [place] 8/16: 1-39% bilateral ICA stenosis  . Cataract    removed  . Diastolic dysfunction    a.  TTE 8/16: EF 55-60%, no RWMA, Gr1DD, calcified mitral annulus, mild biatrial enlargement  . Erectile dysfunction   . History of elbow surgery   . HLD (hyperlipidemia)   . HTN (hypertension)   . Inferior myocardial infarction ([place]) 10/01   stent RCA  . Postoperative wound infection 02/02/2015  . Reflux esophagitis   . Sleep apnea 2017   CPAP at night    Past Surgical History:  Procedure Laterality Date  . arm surgery  2010  . CARDIAC CATHETERIZATION  06/24/11  . CARDIAC CATHETERIZATION N/A 01/18/2015   Procedure: Left Heart Cath with coronary angiography;  Surgeon: Minna Merritts, MD;  Location: [facility];  Service: Cardiovascular;  Laterality: N/A;  . CARDIAC CATHETERIZATION N/A 01/18/2015   Procedure: Intravascular Pressure Wire/FFR Study;  Surgeon: [name], MD;  Location: [facility];  Service: Cardiovascular;  Laterality: N/A;  . CAROTID STENT  03/10/2011  . COLONOSCOPY  2010  . COLONOSCOPY  06/14/2014   Dr Hilarie Fredrickson  . CORONARY ARTERY BYPASS GRAFT N/A 01/24/2015   Procedure: CORONARY ARTERY BYPASS GRAFTING x 4 (LIMA-LAD, SVG-Int 1- Int 2, SVG-PD) ENDOSCOPIC GREATER SAPHENOUS VEIN HARVEST LEFT LEG;  Surgeon: [name], MD;  Location: [facility];  Service: Open Heart Surgery;  Laterality: N/A;  . ESOPHAGOGASTRODUODENOSCOPY (EGD) WITH PROPOFOL N/A 04/24/2016   Procedure: ESOPHAGOGASTRODUODENOSCOPY (EGD) WITH PROPOFOL;  Surgeon: Jerene Bears,  MD;  Location: [facility];  Service: Gastroenterology;  Laterality: N/A;  . EYE SURGERY     lasik 15 yrs. ago, cataracts removed - both eyes   . HAMMER TOE SURGERY     right toe  . LEFT HEART CATH AND CORONARY ANGIOGRAPHY Left 06/10/2017   Procedure: LEFT HEART CATH AND CORONARY ANGIOGRAPHY;  Surgeon: Minna Merritts, MD;  Location: [facility];  Service: Cardiovascular;  Laterality: Left;  . NASAL SINUS SURGERY  2008   septpolasty, bilateral turbinate reduction  . SHOULDER ARTHROSCOPY  2012  . TEE  WITHOUT CARDIOVERSION N/A 01/24/2015   Procedure: TRANSESOPHAGEAL ECHOCARDIOGRAM (TEE);  Surgeon: [name], MD;  Location: [facility];  Service: Open Heart Surgery;  Laterality: N/A;  . [place]  . UPPER GI ENDOSCOPY  07/2014, 04-24-16   Dr Raquel James  . WRIST SURGERY  2011    Social History   Socioeconomic History  . Marital status: Single    Spouse name: Not on file  . Number of children: Not on file  . Years of education: 26  . Highest education level: Not on file  Occupational History  . Occupation: retired    Comment: Agricultural consultant  Social Needs  . Financial resource strain: Not on file  . Food insecurity    Worry: Not on file    Inability: Not on file  . Transportation needs    Medical: Not on file    Non-medical: Not on file  Tobacco Use  . Smoking status: Former Smoker    Packs/day: 2.50    Years: 40.00    Pack years: 100.00    Types: Cigarettes    Quit date: 03/24/2000    Years since quitting: 18.7  . Smokeless tobacco: Never Used  Substance and Sexual Activity  . Alcohol use: Yes    Alcohol/week: 10.0 standard drinks    Types: 10 Shots of liquor per week  . Drug use: No  . Sexual activity: Yes  Lifestyle  . Physical activity    Days per week: Not on file    Minutes per session: Not on file  . Stress: Not on file  Relationships  . Social Herbalist on phone: Not on file    Gets together: Not on file    Attends religious service: Not on file    Active member of club or organization: Not on file    Attends meetings of clubs or organizations: Not on file    Relationship status: Not on file  . Intimate partner violence    Fear of current or ex partner: Not on file    Emotionally abused: Not on file    Physically abused: Not on file    Forced sexual activity: Not on file  Other Topics Concern  . Not on file  Social History Narrative   Singled; lives with son and dog    Occ: retired, back part time at [employer];    Activity: gym  4-5d/wk   Diet: good water, fruits/vegetables daily   Caffeine Use-yes    Family History  Problem Relation Age of Onset  . Hypertension Mother   . Heart disease Mother   . Hypertension Father   . Diabetes Father   . Heart disease Brother 80  . Cancer Paternal Grandfather   . Colon cancer Neg Hx   . Prostate cancer Neg Hx   . Bladder Cancer Neg Hx   . Kidney cancer Neg Hx  No Known Allergies   Review of Systems   Review of Systems: Negative Unless Checked Constitutional: [] Weight loss  [] Fever  [] Chills Cardiac: [] Chest pain   []  Atrial Fibrillation  [] Palpitations   [] Shortness of breath when laying flat   [] Shortness of breath with exertion. [] Shortness of breath at rest Vascular:  [] Pain in legs with walking   [] Pain in legs with standing [] Pain in legs when laying flat   [] Claudication    [] Pain in feet when laying flat    [] History of DVT   [] Phlebitis   [] Swelling in legs   [] Varicose veins   [] Non-healing ulcers Pulmonary:   [] Uses home oxygen   [] Productive cough   [] Hemoptysis   [] Wheeze  [] COPD   [] Asthma Neurologic:  [] Dizziness   [] Seizures  [] Blackouts [] History of stroke   [] History of TIA  [] Aphasia   [] Temporary Blindness   [] Weakness or numbness in arm   [] Weakness or numbness in leg Musculoskeletal:   [] Joint swelling   [] Joint pain   [] Low back pain  []  History of Knee Replacement [] Arthritis [] back Surgeries  []  Spinal Stenosis    Hematologic:  [] Easy bruising  [] Easy bleeding   [] Hypercoagulable state   [] Anemic Gastrointestinal:  [] Diarrhea   [] Vomiting  [] Gastroesophageal reflux/heartburn   [] Difficulty swallowing. [] Abdominal pain Genitourinary:  [] Chronic kidney disease   [] Difficult urination  [] Anuric   [] Blood in urine [] Frequent urination  [] Burning with urination   [] Hematuria Skin:  [] Rashes   [] Ulcers [] Wounds Psychological:  [] History of anxiety   []  History of major depression  []  Memory Difficulties      OBJECTIVE:   Physical Exam  BP (!)  148/74   Pulse 68   Resp 16   Ht 5\' 8"  (1.727 m)   Wt 207 lb (93.9 kg)   BMI 31.47 kg/m   Gen: WD/WN, NAD Head: Randall Ali, No temporalis wasting.  Ear/Nose/Throat: Hearing grossly intact, nares w/o erythema or drainage Eyes: PER, EOMI, sclera nonicteric.  Neck: Supple, no masses.  No JVD.  Pulmonary:  Good air movement, no use of accessory muscles.  Cardiac: RRR Vascular:  Vessel Right Left  Radial Palpable Palpable  Brachial Palpable Palpable  Femoral Palpable Palpable  Popliteal Palpable Palpable  Dorsalis Pedis Palpable Palpable  Posterior Tibial Palpable Palpable   Gastrointestinal: soft, non-distended. No guarding/no peritoneal signs.  Musculoskeletal: M/S 5/5 throughout.  No deformity or atrophy.  Neurologic: Pain and light touch intact in extremities.  Symmetrical.  Speech is fluent. Motor exam as listed above. Psychiatric: Judgment intact, Mood & affect appropriate for pt's clinical situation. Dermatologic: No Venous rashes. No Ulcers Noted.  No changes consistent with cellulitis. Lymph : No Cervical lymphadenopathy, no lichenification or skin changes of chronic lymphedema.       ASSESSMENT AND PLAN:  1. PAD (peripheral artery disease) (HCC) Recommend:  The patient has experienced increased symptoms and is now describing lifestyle limiting claudication and mild rest pain.   Given the severity of the patient's left lower extremity symptoms the patient should undergo angiography and intervention.  Risk and benefits were reviewed the patient.  Indications for the procedure were reviewed.  All questions were answered, the patient agrees to proceed.   The patient should continue walking and begin a more formal exercise program.  The patient should continue antiplatelet therapy and aggressive treatment of the lipid abnormalities  The patient will follow up with me after the angiogram.   2. Essential hypertension Continue antihypertensive medications as already ordered,  these medications have  been reviewed and there are no changes at this time.   3. Mixed hyperlipidemia Continue statin as ordered and reviewed, no changes at this time   4. Carotid artery stenosis, asymptomatic, bilateral Recommend:  Given the patient's asymptomatic subcritical stenosis no further invasive testing or surgery at this time.  Duplex ultrasound shows 40-59% stenosis on right and 1-39% on left.   Continue antiplatelet therapy as prescribed Continue management of CAD, HTN and Hyperlipidemia Healthy heart diet,  encouraged exercise at least 4 times per week Follow up in 12 months with duplex ultrasound and physical exam  - VAS US CAROTID; Future   Current Outpatient Medications on File Prior to Visit  Medication Sig Dispense Refill  . aspirin 81 MG EC tablet Take 81 mg by mouth daily.      . Calcium Carbonate-Vitamin D (CALCIUM-VITAMIN D) 600-125 MG-UNIT TABS Take 1 tablet by mouth daily.    . cholecalciferol (VITAMIN D) 1000 units tablet Take 1,000 Units by mouth daily.    . fluticasone (FLONASE) 50 MCG/ACT nasal spray Place 2 sprays into both nostrils daily. 16 g 3  . ibuprofen (ADVIL,MOTRIN) 200 MG tablet Take 800 mg by mouth every 6 (six) hours as needed for moderate pain.    [name] lisinopril (ZESTRIL) 5 MG tablet TAKE 1 TABLET BY MOUTH EVERY DAY 90 tablet 1  . metoprolol succinate (TOPROL-XL) 25 MG 24 hr tablet TAKE 1/2 TABLET BY MOUTH EVERY DAY 45 tablet 0  . nitroGLYCERIN (NITROSTAT) 0.4 MG SL tablet Place 1 tablet (0.4 mg total) under the tongue every 5 (five) minutes as needed for chest pain. 25 tablet 3  . omeprazole (PRILOSEC) 40 MG capsule TAKE 1 CAPSULE BY MOUTH EVERY DAY 90 capsule 0  . simvastatin (ZOCOR) 40 MG tablet TAKE 1 TABLET(40 MG) BY MOUTH AT BEDTIME 90 tablet 0  . tamsulosin (FLOMAX) 0.4 MG CAPS capsule TAKE 1 CAPSULE BY MOUTH EVERY DAY AFTER SUPPER 90 capsule 1  . traMADol (ULTRAM) 50 MG tablet TAKE 1 TABLET BY MOUTH THREE TIMES DAILY 90 tablet 0  .  vitamin B-12 (CYANOCOBALAMIN) 1000 MCG tablet Take 1,000 mcg by mouth daily.    . vitamin C (ASCORBIC ACID) 500 MG tablet Take 500 mg by mouth daily.    . Zinc 100 MG TABS Take 1 tablet by mouth daily.       No current facility-administered medications on file prior to visit.     There are no Patient Instructions on file for this visit. No follow-ups on file.   Kris Hartmann, NP  This note was completed with Sales executive.  Any errors are purely unintentional.

## 2018-12-21 NOTE — Assessment & Plan Note (Signed)
Recurrent sinus headache associated with marked fatigue. Previously treated for recurrent sinusitis latest improved with augmentin and then 14d amoxicillin course, but symptoms quickly recur. In h/o sinus surgery 2008, will refer back to ENT for further evaluation for chronic sinusitis. Pt agrees with plan.

## 2018-12-21 NOTE — Telephone Encounter (Signed)
A message was left for a return call to discuss scheduling a angio procedure with Dr. Delana Meyer.

## 2018-12-21 NOTE — Progress Notes (Signed)
Virtual visit attempted through [employer]. Due to national recommendations of social distancing due to COVID-19, a virtual visit is felt to be most appropriate for this patient at this time. Reviewed limitations of a virtual visit. Interactive audio and video telecommunications were attempted between myself and Randall Ali., however failed due to patient having technical difficulties OR patient not having access to video capability.  We continued and completed visit with audio only.  Time: 2:07pm - 2:16pm   Patient location: home Provider location: [address] at [facility], office If any vitals were documented, they were collected by patient at home unless specified below.    BP (!) 116/57 (BP Location: Left Arm, Patient Position: Sitting)   Pulse 79   Temp 97.6 F (36.4 C)   Ht 5\' 8"  (1.727 m)   Wt 207 lb (93.9 kg)   BMI 31.47 kg/m    CC: sinus pressure Subjective:    Patient ID: Randall Ali., male    DOB: [birth year], 78 y.o.   MRN: [id number]  HPI: Randall Ali. is a 78 y.o. male presenting on 12/21/2018 for Allergies (C/o HA, fatigue and sinus pressure.  Started about 3 wks ago. )   3 wk h/o worsening frontal sinus headache/pressure, fatigue. Some clear nasal discharge in the morning.  No fevers/chills, cough, dyspnea, significant nasal congestion.  He has restarted flonase and claritin.   He was treated several times in the last few weeks for sinusitis with augmentin 10d then amoxicillin 2 wk course. Symptoms do improve for several weeks while on antibiotics then tend to recur.   H/o sinus surgery Tami Ribas). NASAL SINUS SURGERY 2008-septoplasty, bilateral turbinate reduction.   Recently found PAD planned upcoming intervention     Relevant past medical, surgical, family and social history reviewed and updated as indicated. Interim medical history since our last visit reviewed. Allergies and medications reviewed and updated. Outpatient Medications  Prior to Visit  Medication Sig Dispense Refill  . aspirin 81 MG EC tablet Take 81 mg by mouth daily.      . Calcium Carbonate-Vitamin D (CALCIUM-VITAMIN D) 600-125 MG-UNIT TABS Take 1 tablet by mouth daily.    . cholecalciferol (VITAMIN D) 1000 units tablet Take 1,000 Units by mouth daily.    . fluticasone (FLONASE) 50 MCG/ACT nasal spray Place 2 sprays into both nostrils daily. 16 g 3  . ibuprofen (ADVIL,MOTRIN) 200 MG tablet Take 800 mg by mouth every 6 (six) hours as needed for moderate pain.    [name] lisinopril (ZESTRIL) 5 MG tablet TAKE 1 TABLET BY MOUTH EVERY DAY 90 tablet 1  . metoprolol succinate (TOPROL-XL) 25 MG 24 hr tablet TAKE 1/2 TABLET BY MOUTH EVERY DAY 45 tablet 0  . nitroGLYCERIN (NITROSTAT) 0.4 MG SL tablet Place 1 tablet (0.4 mg total) under the tongue every 5 (five) minutes as needed for chest pain. 25 tablet 3  . omeprazole (PRILOSEC) 40 MG capsule TAKE 1 CAPSULE BY MOUTH EVERY DAY 90 capsule 0  . simvastatin (ZOCOR) 40 MG tablet TAKE 1 TABLET(40 MG) BY MOUTH AT BEDTIME 90 tablet 0  . tamsulosin (FLOMAX) 0.4 MG CAPS capsule TAKE 1 CAPSULE BY MOUTH EVERY DAY AFTER SUPPER 90 capsule 1  . traMADol (ULTRAM) 50 MG tablet TAKE 1 TABLET BY MOUTH THREE TIMES DAILY 90 tablet 0  . vitamin B-12 (CYANOCOBALAMIN) 1000 MCG tablet Take 1,000 mcg by mouth daily.    . vitamin C (ASCORBIC ACID) 500 MG tablet Take 500 mg by mouth daily.    [name]  Zinc 100 MG TABS Take 1 tablet by mouth daily.       No facility-administered medications prior to visit.      Per HPI unless specifically indicated in ROS section below Review of Systems Objective:    BP (!) 116/57 (BP Location: Left Arm, Patient Position: Sitting)   Pulse 79   Temp 97.6 F (36.4 C)   Ht 5\' 8"  (1.727 m)   Wt 207 lb (93.9 kg)   BMI 31.47 kg/m   Wt Readings from Last 3 Encounters:  12/21/18 207 lb (93.9 kg)  12/21/18 207 lb (93.9 kg)  11/25/18 210 lb 3.2 oz (95.3 kg)     Physical exam: Gen: alert, NAD, not ill appearing  Pulm: speaks in complete sentences without increased work of breathing Psych: normal mood, normal thought content      Assessment & Plan:   Problem List Items Addressed This Visit    Sinus headache - Primary    Recurrent sinus headache associated with marked fatigue. Previously treated for recurrent sinusitis latest improved with augmentin and then 14d amoxicillin course, but symptoms quickly recur. In h/o sinus surgery 2008, will refer back to ENT for further evaluation for chronic sinusitis. Pt agrees with plan.       Relevant Orders   Ambulatory referral to ENT       Meds ordered this encounter  Medications  . loratadine (CLARITIN) 10 MG tablet    Sig: Take 1 tablet (10 mg total) by mouth daily.   Orders Placed This Encounter  Procedures  . Ambulatory referral to ENT    Referral Priority:   Routine    Referral Type:   Consultation    Referral Reason:   Specialty Services Required    Requested Specialty:   Otolaryngology    Number of Visits Requested:   1    I discussed the assessment and treatment plan with the patient. The patient was provided an opportunity to ask questions and all were answered. The patient agreed with the plan and demonstrated an understanding of the instructions. The patient was advised to call back or seek an in-person evaluation if the symptoms worsen or if the condition fails to improve as anticipated.  Follow up plan: No follow-ups on file.  Ria Bush, MD

## 2018-12-22 ENCOUNTER — Telehealth (INDEPENDENT_AMBULATORY_CARE_PROVIDER_SITE_OTHER): Payer: Self-pay

## 2018-12-22 NOTE — Telephone Encounter (Signed)
Spoke with the patient, the patient is scheduled with Dr. Delana Meyer for 01/04/2019 with a 9:00 am arrival time and Covid testing on 12/31/2018 between 12:30-2:30 pm. This information will be mailed out to the patient.

## 2018-12-26 ENCOUNTER — Other Ambulatory Visit (INDEPENDENT_AMBULATORY_CARE_PROVIDER_SITE_OTHER): Payer: Self-pay | Admitting: Nurse Practitioner

## 2018-12-27 ENCOUNTER — Other Ambulatory Visit (INDEPENDENT_AMBULATORY_CARE_PROVIDER_SITE_OTHER): Payer: Self-pay

## 2018-12-27 DIAGNOSIS — D485 Neoplasm of uncertain behavior of skin: Secondary | ICD-10-CM | POA: Diagnosis not present

## 2018-12-27 DIAGNOSIS — C44729 Squamous cell carcinoma of skin of left lower limb, including hip: Secondary | ICD-10-CM | POA: Diagnosis not present

## 2018-12-27 DIAGNOSIS — Z85828 Personal history of other malignant neoplasm of skin: Secondary | ICD-10-CM | POA: Diagnosis not present

## 2018-12-27 DIAGNOSIS — L570 Actinic keratosis: Secondary | ICD-10-CM | POA: Diagnosis not present

## 2018-12-27 DIAGNOSIS — D2272 Melanocytic nevi of left lower limb, including hip: Secondary | ICD-10-CM | POA: Diagnosis not present

## 2018-12-27 DIAGNOSIS — X32XXXA Exposure to sunlight, initial encounter: Secondary | ICD-10-CM | POA: Diagnosis not present

## 2018-12-27 DIAGNOSIS — C44519 Basal cell carcinoma of skin of other part of trunk: Secondary | ICD-10-CM | POA: Diagnosis not present

## 2018-12-27 DIAGNOSIS — Z85820 Personal history of malignant melanoma of skin: Secondary | ICD-10-CM | POA: Diagnosis not present

## 2018-12-27 DIAGNOSIS — C44619 Basal cell carcinoma of skin of left upper limb, including shoulder: Secondary | ICD-10-CM | POA: Diagnosis not present

## 2018-12-27 DIAGNOSIS — D2262 Melanocytic nevi of left upper limb, including shoulder: Secondary | ICD-10-CM | POA: Diagnosis not present

## 2018-12-28 DIAGNOSIS — R51 Headache: Secondary | ICD-10-CM | POA: Diagnosis not present

## 2018-12-30 NOTE — Telephone Encounter (Signed)
Patient called wanting to know if someone could drop him off and come back and pick him up. I let the patient know this would be fine.

## 2018-12-31 ENCOUNTER — Other Ambulatory Visit: Payer: Self-pay

## 2018-12-31 ENCOUNTER — Other Ambulatory Visit
Admission: RE | Admit: 2018-12-31 | Discharge: 2018-12-31 | Disposition: A | Discharge status: Home / Self Care | Payer: PPO | Source: Ambulatory Visit | Attending: Vascular Surgery | Admitting: Vascular Surgery

## 2018-12-31 DIAGNOSIS — Z1159 Encounter for screening for other viral diseases: Secondary | ICD-10-CM | POA: Insufficient documentation

## 2018-12-31 NOTE — Telephone Encounter (Signed)
Spoke with the patient and gave him his amended time to arrive at the [facility] for his procedure on 01/04/2019. Patient is to arrive at 8:00 am instead of 9:00 am.

## 2019-01-01 LAB — SARS CORONAVIRUS 2 (TAT 6-24 HRS): SARS Coronavirus 2: NEGATIVE

## 2019-01-03 ENCOUNTER — Encounter: Payer: Self-pay | Admitting: Family Medicine

## 2019-01-03 ENCOUNTER — Other Ambulatory Visit: Payer: Self-pay | Admitting: Family Medicine

## 2019-01-03 ENCOUNTER — Ambulatory Visit (INDEPENDENT_AMBULATORY_CARE_PROVIDER_SITE_OTHER): Payer: PPO | Admitting: Family Medicine

## 2019-01-03 VITALS — BP 119/60 | HR 72 | Temp 97.0°F | Ht 68.0 in | Wt 208.0 lb

## 2019-01-03 DIAGNOSIS — R519 Headache, unspecified: Secondary | ICD-10-CM

## 2019-01-03 DIAGNOSIS — N401 Enlarged prostate with lower urinary tract symptoms: Secondary | ICD-10-CM | POA: Diagnosis not present

## 2019-01-03 DIAGNOSIS — R5381 Other malaise: Secondary | ICD-10-CM | POA: Diagnosis not present

## 2019-01-03 DIAGNOSIS — R5382 Chronic fatigue, unspecified: Secondary | ICD-10-CM

## 2019-01-03 DIAGNOSIS — R51 Headache: Secondary | ICD-10-CM | POA: Diagnosis not present

## 2019-01-03 DIAGNOSIS — N3943 Post-void dribbling: Secondary | ICD-10-CM | POA: Diagnosis not present

## 2019-01-03 DIAGNOSIS — I480 Paroxysmal atrial fibrillation: Secondary | ICD-10-CM | POA: Diagnosis not present

## 2019-01-03 DIAGNOSIS — R0989 Other specified symptoms and signs involving the circulatory and respiratory systems: Secondary | ICD-10-CM | POA: Diagnosis not present

## 2019-01-03 DIAGNOSIS — E782 Mixed hyperlipidemia: Secondary | ICD-10-CM | POA: Diagnosis not present

## 2019-01-03 DIAGNOSIS — K760 Fatty (change of) liver, not elsewhere classified: Secondary | ICD-10-CM | POA: Diagnosis not present

## 2019-01-03 MED ORDER — CEFAZOLIN SODIUM-DEXTROSE 2-4 GM/100ML-% IV SOLN
2.0000 g | Freq: Once | INTRAVENOUS | Status: AC
  Administered 2019-01-04: 2 g via INTRAVENOUS

## 2019-01-03 NOTE — Progress Notes (Signed)
Randall Ali. - 78 y.o. male  MRN [id number]  Date of Birth: March 20, 1941  PCP: Ria Bush, MD  This service was provided via telemedicine. Phone Visit performed on 01/03/2019    Rationale for phone visit along with limitations reviewed. Patient consented to telephone encounter.    Location of patient: at home Location of provider: in office [place] @ [facility] Name of referring provider: N/A  Names of persons and role in encounter: Provider: Ria Bush, MD  Patient: Randall Ali.  Other: N/A  Time on call: 2:59pm - 3:16pm  Subjective: Chief Complaint  Patient presents with  . Allergies    C/o sinus pressure and HA.  Was seen by ENT last week and told his sinuses are ok.  They are referring pt to HA specialist in 02/2019.  Tried Zyrtec, not helpful.       HPI:  See prior notes for details.  Ongoing frontal sinus pressure headache, marked fatigue. Some soreness of neck. Worsening symptoms over the last 2 days.  No congestion or rhinorrhea, no fevers/chills. No PNdrainage or ST.  No pain at temples, no vision changes. No nausea, photo/phonophobia.  Does regularly take ibuprofen '200mg'$  2-4 a day  He was treated several times in the last few weeks for sinusitis with augmentin 10d then amoxicillin 2 wk course. Symptoms do improve for several weeks while on antibiotics then tend to recur. initial prednisone course didn't help at all.  Continues flonase and claritin without benefit.  Has seen eye doctor with benign exam.  H/o sinus surgery Tami Ribas). NASAL SINUS SURGERY 2008-septoplasty, bilateral turbinate reduction.  Upcoming appt tomorrow for LE angiogram for progressing claudication.   Saw Dr Tami Ribas last week - told sinuses were clear - had normal CT scan. Records not yet available. He was referred to neurology Manuella Ghazi) for further evaluation - appt not until 02/2019.    Objective/Observations: No physical exam or vital signs collected unless  specifically identified below.   BP 119/60   Pulse 72   Temp (!) 97 F (36.1 C) (Temporal)   Ht '5\' 8"'$  (1.727 m)   Wt 208 lb (94.3 kg)   BMI 31.63 kg/m    Respiratory status: speaks in complete sentences without evident shortness of breath.   Assessment/Plan:  He will call to schedule labs for physical this week and keep physical for next month.  Sinus headache Ongoing along with marked fatigue, of unclear cause. Blood pressures are reported as normal. No new medications. HA not consistent with migraines. ? Plessis vs TTH. Did not recommend repeat abx course at this time althought that is the only thing that seems to improve headache - but had recent reassuring sinus eval by ENT.  Prednisone has not helped. Has had normal ESR in the past and location of headache is not consistent with GCA - however will repeat testing.  I did ask him to come in for fasting labs in preparation for upcoming physical.    I discussed the assessment and treatment plan with the patient. The patient was provided an opportunity to ask questions and all were answered. The patient agreed with the plan and demonstrated an understanding of the instructions.   Lab Orders     Lipid panel     Comprehensive metabolic panel     PSA     CBC with Differential/Platelet     Vitamin B12     VITAMIN D 25 Hydroxy (Vit-D Deficiency, Fractures)  No orders of the defined  types were placed in this encounter.   The patient was advised to call back or seek an in-person evaluation if the symptoms worsen or if the condition fails to improve as anticipated.  Ria Bush, MD

## 2019-01-04 ENCOUNTER — Ambulatory Visit
Admission: RE | Admit: 2019-01-04 | Discharge: 2019-01-04 | Disposition: A | Discharge status: Home / Self Care | Payer: PPO | Attending: Vascular Surgery | Admitting: Vascular Surgery

## 2019-01-04 ENCOUNTER — Encounter
Admission: RE | Disposition: A | Discharge status: Home / Self Care | Payer: Self-pay | Source: Home / Self Care | Attending: Vascular Surgery

## 2019-01-04 ENCOUNTER — Other Ambulatory Visit: Payer: Self-pay

## 2019-01-04 DIAGNOSIS — M1990 Unspecified osteoarthritis, unspecified site: Secondary | ICD-10-CM | POA: Diagnosis not present

## 2019-01-04 DIAGNOSIS — I252 Old myocardial infarction: Secondary | ICD-10-CM | POA: Insufficient documentation

## 2019-01-04 DIAGNOSIS — Z87891 Personal history of nicotine dependence: Secondary | ICD-10-CM | POA: Diagnosis not present

## 2019-01-04 DIAGNOSIS — I2510 Atherosclerotic heart disease of native coronary artery without angina pectoris: Secondary | ICD-10-CM | POA: Insufficient documentation

## 2019-01-04 DIAGNOSIS — I70223 Atherosclerosis of native arteries of extremities with rest pain, bilateral legs: Secondary | ICD-10-CM | POA: Diagnosis not present

## 2019-01-04 DIAGNOSIS — I70219 Atherosclerosis of native arteries of extremities with intermittent claudication, unspecified extremity: Secondary | ICD-10-CM

## 2019-01-04 DIAGNOSIS — I6523 Occlusion and stenosis of bilateral carotid arteries: Secondary | ICD-10-CM | POA: Insufficient documentation

## 2019-01-04 DIAGNOSIS — Z7982 Long term (current) use of aspirin: Secondary | ICD-10-CM | POA: Insufficient documentation

## 2019-01-04 DIAGNOSIS — Z79899 Other long term (current) drug therapy: Secondary | ICD-10-CM | POA: Diagnosis not present

## 2019-01-04 DIAGNOSIS — I10 Essential (primary) hypertension: Secondary | ICD-10-CM | POA: Insufficient documentation

## 2019-01-04 DIAGNOSIS — E785 Hyperlipidemia, unspecified: Secondary | ICD-10-CM | POA: Insufficient documentation

## 2019-01-04 DIAGNOSIS — G4730 Sleep apnea, unspecified: Secondary | ICD-10-CM | POA: Diagnosis not present

## 2019-01-04 DIAGNOSIS — I70222 Atherosclerosis of native arteries of extremities with rest pain, left leg: Secondary | ICD-10-CM | POA: Diagnosis not present

## 2019-01-04 DIAGNOSIS — I70211 Atherosclerosis of native arteries of extremities with intermittent claudication, right leg: Secondary | ICD-10-CM | POA: Diagnosis not present

## 2019-01-04 HISTORY — PX: LOWER EXTREMITY ANGIOGRAPHY: CATH118251

## 2019-01-04 LAB — CREATININE, SERUM
Creatinine, Ser: 1.14 mg/dL (ref 0.61–1.24)
GFR calc Af Amer: >60 mL/min (ref >60)
GFR calc non Af Amer: >60 mL/min (ref >60)

## 2019-01-04 LAB — BUN: BUN: 21 mg/dL (ref 8–23)

## 2019-01-04 SURGERY — LOWER EXTREMITY ANGIOGRAPHY
Anesthesia: Moderate Sedation | Site: Leg Lower | Laterality: Left

## 2019-01-04 MED ORDER — SODIUM CHLORIDE 0.9 % IV SOLN: INTRAVENOUS | Status: DC

## 2019-01-04 MED ORDER — CLOPIDOGREL BISULFATE 300 MG PO TABS
300.0000 mg | ORAL_TABLET | ORAL | Status: AC
  Administered 2019-01-04: 300 mg via ORAL

## 2019-01-04 MED ORDER — SODIUM CHLORIDE 0.9% FLUSH: 3.0000 mL | Freq: Two times a day (BID) | INTRAVENOUS | Status: DC

## 2019-01-04 MED ORDER — MIDAZOLAM HCL 5 MG/5ML IJ SOLN
INTRAMUSCULAR | Status: AC
  Filled 2019-01-04: qty 5

## 2019-01-04 MED ORDER — ACETAMINOPHEN 325 MG PO TABS: 650.0000 mg | ORAL_TABLET | ORAL | Status: DC | PRN

## 2019-01-04 MED ORDER — SODIUM CHLORIDE 0.9% FLUSH: 3.0000 mL | INTRAVENOUS | Status: DC | PRN

## 2019-01-04 MED ORDER — CLOPIDOGREL BISULFATE 75 MG PO TABS: 75.0000 mg | ORAL_TABLET | Freq: Every day | ORAL | 5 refills | Status: DC

## 2019-01-04 MED ORDER — LABETALOL HCL 5 MG/ML IV SOLN: 10.0000 mg | INTRAVENOUS | Status: DC | PRN

## 2019-01-04 MED ORDER — SODIUM CHLORIDE 0.9 % IV SOLN
INTRAVENOUS | Status: DC
  Administered 2019-01-04: 08:00:00 via INTRAVENOUS

## 2019-01-04 MED ORDER — CLOPIDOGREL BISULFATE 75 MG PO TABS
ORAL_TABLET | ORAL | Status: AC
  Filled 2019-01-04: qty 4

## 2019-01-04 MED ORDER — FENTANYL CITRATE (PF) 100 MCG/2ML IJ SOLN
INTRAMUSCULAR | Status: AC
  Filled 2019-01-04: qty 2

## 2019-01-04 MED ORDER — FENTANYL CITRATE (PF) 100 MCG/2ML IJ SOLN
INTRAMUSCULAR | Status: DC | PRN
  Administered 2019-01-04: 25 ug via INTRAVENOUS
  Administered 2019-01-04: 50 ug via INTRAVENOUS
  Administered 2019-01-04: 25 ug via INTRAVENOUS
  Administered 2019-01-04: 50 ug via INTRAVENOUS

## 2019-01-04 MED ORDER — MORPHINE SULFATE (PF) 4 MG/ML IV SOLN: 2.0000 mg | INTRAVENOUS | Status: DC | PRN

## 2019-01-04 MED ORDER — MIDAZOLAM HCL 2 MG/2ML IJ SOLN
INTRAMUSCULAR | Status: DC | PRN
  Administered 2019-01-04: 0.5 mg via INTRAVENOUS
  Administered 2019-01-04: 1 mg via INTRAVENOUS
  Administered 2019-01-04: 0.5 mg via INTRAVENOUS
  Administered 2019-01-04: 2 mg via INTRAVENOUS

## 2019-01-04 MED ORDER — ONDANSETRON HCL 4 MG/2ML IJ SOLN: 4.0000 mg | Freq: Four times a day (QID) | INTRAMUSCULAR | Status: DC | PRN

## 2019-01-04 MED ORDER — MIDAZOLAM HCL 2 MG/ML PO SYRP: 8.0000 mg | ORAL_SOLUTION | Freq: Once | ORAL | Status: DC | PRN

## 2019-01-04 MED ORDER — HEPARIN SODIUM (PORCINE) 1000 UNIT/ML IJ SOLN
INTRAMUSCULAR | Status: DC | PRN
  Administered 2019-01-04: 5000 [IU] via INTRAVENOUS

## 2019-01-04 MED ORDER — PANTOPRAZOLE SODIUM 40 MG PO TBEC: 40.0000 mg | DELAYED_RELEASE_TABLET | Freq: Every day | ORAL | 11 refills | Status: DC

## 2019-01-04 MED ORDER — OXYCODONE HCL 5 MG PO TABS: 5.0000 mg | ORAL_TABLET | ORAL | Status: DC | PRN

## 2019-01-04 MED ORDER — CEFAZOLIN SODIUM-DEXTROSE 2-4 GM/100ML-% IV SOLN
INTRAVENOUS | Status: AC
  Filled 2019-01-04: qty 100

## 2019-01-04 MED ORDER — HYDROMORPHONE HCL 1 MG/ML IJ SOLN: 1.0000 mg | Freq: Once | INTRAMUSCULAR | Status: DC | PRN

## 2019-01-04 MED ORDER — HEPARIN SODIUM (PORCINE) 1000 UNIT/ML IJ SOLN
INTRAMUSCULAR | Status: AC
  Filled 2019-01-04: qty 1

## 2019-01-04 MED ORDER — FAMOTIDINE 20 MG PO TABS: 40.0000 mg | ORAL_TABLET | Freq: Once | ORAL | Status: DC | PRN

## 2019-01-04 MED ORDER — METHYLPREDNISOLONE SODIUM SUCC 125 MG IJ SOLR: 125.0000 mg | Freq: Once | INTRAMUSCULAR | Status: DC | PRN

## 2019-01-04 MED ORDER — DIPHENHYDRAMINE HCL 50 MG/ML IJ SOLN: 50.0000 mg | Freq: Once | INTRAMUSCULAR | Status: DC | PRN

## 2019-01-04 MED ORDER — HYDRALAZINE HCL 20 MG/ML IJ SOLN: 5.0000 mg | INTRAMUSCULAR | Status: DC | PRN

## 2019-01-04 MED ORDER — LIDOCAINE HCL (PF) 1 % IJ SOLN
INTRAMUSCULAR | Status: AC
  Filled 2019-01-04: qty 90

## 2019-01-04 MED ORDER — SODIUM CHLORIDE 0.9 % IV SOLN: 250.0000 mL | INTRAVENOUS | Status: DC | PRN

## 2019-01-04 SURGICAL SUPPLY — 30 items
BALLN LUTONIX AV 8X40X75 (BALLOONS) ×2
BALLN LUTONIX DCB 6X40X130 (BALLOONS) ×2
BALLN LUTONIX DCB 7X40X130 (BALLOONS) ×2
BALLN ULTRVRSE 9X40X75C (BALLOONS) ×4
BALLOON LUTONIX AV 8X40X75 (BALLOONS) IMPLANT
BALLOON LUTONIX DCB 6X40X130 (BALLOONS) IMPLANT
BALLOON LUTONIX DCB 7X40X130 (BALLOONS) IMPLANT
BALLOON ULTRVRSE 9X40X75C (BALLOONS) IMPLANT
CATH BEACON 5 .035 65 RIM TIP (CATHETERS) ×1 IMPLANT
CATH PIG 70CM (CATHETERS) ×1 IMPLANT
COVER PROBE U/S 5X48 (MISCELLANEOUS) ×1 IMPLANT
DEVICE PRESTO INFLATION (MISCELLANEOUS) ×2 IMPLANT
DEVICE STARCLOSE SE CLOSURE (Vascular Products) ×2 IMPLANT
DEVICE TORQUE .025-.038 (MISCELLANEOUS) ×1 IMPLANT
GLIDECATH 4FR STR (CATHETERS) ×1 IMPLANT
GUIDEWIRE SUPER STIFF .035X180 (WIRE) ×1 IMPLANT
INTRODUCER 7FR 23CM (INTRODUCER) ×2 IMPLANT
NDL ENTRY 21GA 7CM ECHOTIP (NEEDLE) IMPLANT
NEEDLE ENTRY 21GA 7CM ECHOTIP (NEEDLE) ×2 IMPLANT
PACK ANGIOGRAPHY (CUSTOM PROCEDURE TRAY) ×2 IMPLANT
SET INTRO CAPELLA COAXIAL (SET/KITS/TRAYS/PACK) ×1 IMPLANT
SHEATH BALKIN 6FR (SHEATH) ×1 IMPLANT
SHEATH BRITE TIP 5FRX11 (SHEATH) ×1 IMPLANT
SHIELD RADPAD DADD DRAPE 4X9 (MISCELLANEOUS) ×1 IMPLANT
STENT LIFESTREAM 8X37X80 (Permanent Stent) ×2 IMPLANT
TUBING CONTRAST HIGH PRESS 72 (TUBING) ×1 IMPLANT
WIRE AQUATRACK .035X260CM (WIRE) ×1 IMPLANT
WIRE J 3MM .035X145CM (WIRE) ×1 IMPLANT
WIRE MAGIC TOR.035 180C (WIRE) ×1 IMPLANT
WIRE MAGIC TORQUE 260C (WIRE) ×1 IMPLANT

## 2019-01-04 NOTE — Op Note (Signed)
[facility]  Percutaneous Study/Intervention Procedural Note   Date of Surgery: 01/04/2019  Surgeon:Andru Genter, Dolores Lory   Pre-operative Diagnosis: Atherosclerotic occlusive disease bilateral lower extremities with lifestyle limiting claudication bilaterally and rest pain of the left lower extremity  Post-operative diagnosis:  Same  Procedure(s) Performed:  1.  Abdominal aortogram  2.  Bilateral distal runoff  3.  Percutaneous transluminal angioplasty and stent placement right common iliac artery; "kissing balloon" technique  4.  Percutaneous transluminal and plasty and stent placement left common iliac artery; "kissing balloon" technique  5.  Percutaneous transluminal angioplasty bilateral external iliac arteries, right to 8 mm and left to 7 mm diameter.             6.  Ultrasound guided access bilateral common femoral arteries  7.  StarClose closure device bilateral common femoral arteries  Anesthesia: Conscious sedation was administered under my direct supervision by the interventional radiology RN. IV Versed plus fentanyl were utilized. Continuous ECG, pulse oximetry and blood pressure was monitored throughout the entire procedure. Conscious sedation was for a total of 105 minutes.  Sheath: 7 French 23 cm Pinnacle sheaths bilateral common femoral arteries retrograde  Contrast: 145 cc  Fluoroscopy Time: 13.1 minutes  Indications: Patient presented to the office with increasing leg pain.  Left is more affected than the right.  Noninvasive studies as well as physical examination were consistent with severe atherosclerotic occlusive disease.  Risks and benefits for angiography as well as intervention were reviewed in detail all questions have been answered patient is agreed to proceed.  Procedure:  Randall Aliis a 78 y.o. male who was identified and appropriate procedural time out was performed.  The patient was then placed supine on the table and prepped  and draped in the usual sterile fashion.  Ultrasound was used to evaluate the right common femoral artery.  It was echolucent and pulsatile indicating it is patent .  An ultrasound image was acquired for the permanent record.  A micropuncture needle was used to access the right common femoral artery under direct ultrasound guidance.  The microwire was then advanced under fluoroscopic guidance without difficulty followed by the micro-sheath  A 0.035 J wire was advanced without resistance and a 5Fr sheath was placed.    The pigtail catheter was then positioned at the level of T12 and an AP image of the aorta was obtained. After review the images the pigtail catheter was repositioned above the aortic bifurcation and bilateral oblique views of the pelvis were obtained.  At this point I cross the aortic bifurcation with a rim catheter and the aqua track wire and then advance the catheter down to the left common femoral.  A steep LAO projection of the femoral bifurcation was then obtained.  At this point attempts at advancing the pigtail catheter were unsuccessful given the greater than 95% stenosis of the common iliac and attempts at advancing a 6 [place] Balkan sheath were unsuccessful.  Because of this I did not perform runoff below the femoral bifurcation but elected to move forward with treating the profound iliac disease.  After review the images the ultrasound was reprepped and delivered back onto the sterile field. The left common femoral was then imaged with the ultrasound it was noted to be echolucent and pulsatile indicating patency. Images recorded for the permanent record. Under real-time visualization a microneedle was inserted into the anterior wall the common femoral artery microwire was then advanced without difficulty under fluoroscopic guidance followed by  placement of the micro-sheath.  A aqua track wire was then negotiated under fluoroscopic guidance into the aorta.  7 French 23 cm sheath was  then placed, unfortunately this would not cross the greater than 95% stenosis in the common iliac.  With the sheath tip positioned in the mid external iliac magnified imaging in the RAO projection was obtained of the left common iliac.  5000 units of heparin was given and allowed to circulate for proximally 4 minutes.  A 6 mm x 40 mm Lutonix drug-eluting balloon was then advanced across the left common iliac lesion was inflated to 10 atm for 1 minute.  The dilator was then returned to the 23 cm sheath and 23 cm sheath was able to cross this lesion and was positioned with the tip in the distal aorta.  The 6 [place] Balkan sheath on the right was then upsized to a 7 [place] 23 cm Pinnacle sheath as well after a Magic torque wire was advanced through the pigtail catheter. Magnified images of the aortic bifurcation were then made using hand injection contrast from the femoral sheaths. After appropriate sizing a 8 mm x 37 mm lifestream stent was selected for the right and a 8 mm x 37 mm lifestream stent was selected for the left. There were then advanced and positioned just above the aortic bifurcation. Insufflation for full expansion of the stents was performed simultaneously. Follow-up imaging was then performed and the stents appeared slightly undersized.  Subsequently, 9 mm x 40 mm Ultraverse balloons were advanced up both sides simultaneously and dilatation of the stents was performed to 10 atm.  Addressing the left external iliac first and RAO projection and magnified imaging was obtained and a 7 mm x 40 mm Lutonix drug-eluting balloon was advanced across the proximal external iliac lesion.  Inflation was to 12 atm for 1 full minute.  Follow-up imaging demonstrated the 80% ostial stenosis was now well treated and less than 10% residual stenosis was noted.  The detector was then positioned to the LAO projection and magnified imaging of the right external iliac artery was obtained.  The greater than 80%  stenosis at the ostia of the right external iliac artery was then treated with a 8 mm x 40 mm Lutonix drug-eluting balloon.  Inflation was to 10 atm for 1 full minute.  Follow-up imaging demonstrated there was less than 10% residual stenosis.  Based on this image the pigtail catheter was reintroduced up the right side.  The pigtail catheter was then introduced up the right and bolus injection of contrast was used to perform final imaging of the distal aortic reconstruction.  Oblique views were then obtained of the groins in succession and Star close device is deployed without difficulty. There were no immediate complications   Findings:   Aortogram:  The abdominal aorta is opacified with a bolus injection contrast. Demonstrates diffuse disease but there are no hemodynamically significant lesions noted until the distal aortic bifurcation where bilateral proximal common iliac lesions with a right greater than 70% ostial stenosis and a greater than 95% left common iliac artery stenosis. There is moderate poststenotic dilatation noted of the common iliac arteries as well.  There is a greater than 70% ostial and proximal left external iliac artery stenosis and a greater than 80% proximal and ostial right external iliac artery stenosis.  Right Lower Extremity: Right common femoral demonstrates a greater than 80% stenosis distally at the femoral bifurcation.  Visualized portions of the SFA and profunda femoris are  widely patent.  Left Lower Extremity: The left common femoral demonstrates moderate disease but I do not appreciate a hemodynamically significant lesion.  The femoral bifurcation is patent.  There appears to be moderate disease at the ostia of the left SFA but otherwise the SFA is free of hemodynamically significant stenoses.  The left profunda is widely patent.  Following placement of the iliac stents there is now wide patency with less than 10% residual stenosis with rapid flow through the aortic  bifurcation bilaterally.  Following angioplasty to 7 mm on the left and 8 mm on the right the external iliac arteries are widely patent with less than 10% residual stenosis.  Summary:  Successful reconstruction of the distal aorta and bilateral iliac arteries  Disposition: Patient was taken to the recovery room in stable condition having tolerated the procedure well.  Belenda Cruise Zacharie Portner 01/04/2019,10:59 AM

## 2019-01-04 NOTE — Discharge Instructions (Signed)
You may shower as of tomorrow.  Please keep your groins clean and dry.

## 2019-01-04 NOTE — Telephone Encounter (Signed)
Eprescribed.

## 2019-01-04 NOTE — H&P (Signed)
[facility] History & Physical Update  The patient was interviewed and re-examined.  [name] previous History and Physical has been reviewed and is unchanged.  There is no change in the plan of care. We plan to proceed with the scheduled procedure.  Hortencia Pilar, MD  01/04/2019, 10:51 AM

## 2019-01-04 NOTE — Assessment & Plan Note (Signed)
Ongoing along with marked fatigue, of unclear cause. Blood pressures are reported as normal. No new medications. HA not consistent with migraines. ? [place] vs TTH. Did not recommend repeat abx course at this time althought that is the only thing that seems to improve headache - but had recent reassuring sinus eval by ENT.  Prednisone has not helped. Has had normal ESR in the past and location of headache is not consistent with GCA - however will repeat testing.  I did ask him to come in for fasting labs in preparation for upcoming physical.

## 2019-01-05 ENCOUNTER — Encounter: Payer: Self-pay | Admitting: Vascular Surgery

## 2019-01-05 ENCOUNTER — Telehealth: Payer: Self-pay | Admitting: Family Medicine

## 2019-01-05 DIAGNOSIS — R519 Headache, unspecified: Secondary | ICD-10-CM

## 2019-01-05 NOTE — Telephone Encounter (Signed)
Referred to Dr Manuella Ghazi [facility] neurology for headaches.  Doubt will be able to get in sooner with [facility] neuro but would offer [facility]

## 2019-01-05 NOTE — Telephone Encounter (Signed)
Patient called and stated he was seen for his headaches at our office. He was referred to an office at [facility] for this. Patient was not sure what the name of the doctor or what type of practice it was. But he wanted to know if we have some where in [place] that could see him for his headaches and if they could get him in soon   C/B # [phone]

## 2019-01-06 ENCOUNTER — Other Ambulatory Visit: Payer: Self-pay

## 2019-01-06 ENCOUNTER — Other Ambulatory Visit (INDEPENDENT_AMBULATORY_CARE_PROVIDER_SITE_OTHER): Payer: PPO

## 2019-01-06 DIAGNOSIS — K760 Fatty (change of) liver, not elsewhere classified: Secondary | ICD-10-CM | POA: Diagnosis not present

## 2019-01-06 DIAGNOSIS — R5382 Chronic fatigue, unspecified: Secondary | ICD-10-CM

## 2019-01-06 DIAGNOSIS — N3943 Post-void dribbling: Secondary | ICD-10-CM | POA: Diagnosis not present

## 2019-01-06 DIAGNOSIS — R5381 Other malaise: Secondary | ICD-10-CM | POA: Diagnosis not present

## 2019-01-06 DIAGNOSIS — E782 Mixed hyperlipidemia: Secondary | ICD-10-CM | POA: Diagnosis not present

## 2019-01-06 DIAGNOSIS — N401 Enlarged prostate with lower urinary tract symptoms: Secondary | ICD-10-CM | POA: Diagnosis not present

## 2019-01-06 LAB — CBC WITH DIFFERENTIAL/PLATELET
Basophils Absolute: 0 10*3/uL (ref 0.0–0.1)
Basophils Relative: 0.6 % (ref 0.0–3.0)
Eosinophils Absolute: 0.2 10*3/uL (ref 0.0–0.7)
Eosinophils Relative: 2.4 % (ref 0.0–5.0)
HCT: 43.5 % (ref 39.0–52.0)
Hemoglobin: 14.7 g/dL (ref 13.0–17.0)
Lymphocytes Relative: 19 % (ref 12.0–46.0)
Lymphs Abs: 1.5 10*3/uL (ref 0.7–4.0)
MCHC: 33.8 g/dL (ref 30.0–36.0)
MCV: 92.3 fl (ref 78.0–100.0)
Monocytes Absolute: 1.2 10*3/uL — ABNORMAL HIGH (ref 0.1–1.0)
Monocytes Relative: 15.2 % — ABNORMAL HIGH (ref 3.0–12.0)
Neutro Abs: 5.1 10*3/uL (ref 1.4–7.7)
Neutrophils Relative %: 62.8 % (ref 43.0–77.0)
Platelets: 170 10*3/uL (ref 150.0–400.0)
RBC: 4.71 Mil/uL (ref 4.22–5.81)
RDW: 12.7 % (ref 11.5–15.5)
WBC: 8.1 10*3/uL (ref 4.0–10.5)

## 2019-01-06 LAB — LIPID PANEL
Cholesterol: 115 mg/dL (ref 0–200)
HDL: 43.3 mg/dL (ref >39.00)
LDL Cholesterol: 56 mg/dL (ref 0–99)
NonHDL: 71.38
Total CHOL/HDL Ratio: 3
Triglycerides: 76 mg/dL (ref 0.0–149.0)
VLDL: 15.2 mg/dL (ref 0.0–40.0)

## 2019-01-06 LAB — COMPREHENSIVE METABOLIC PANEL
ALT: 14 U/L (ref 0–53)
AST: 12 U/L (ref 0–37)
Albumin: 4.1 g/dL (ref 3.5–5.2)
Alkaline Phosphatase: 57 U/L (ref 39–117)
BUN: 19 mg/dL (ref 6–23)
CO2: 28 mEq/L (ref 19–32)
Calcium: 9.2 mg/dL (ref 8.4–10.5)
Chloride: 106 mEq/L (ref 96–112)
Creatinine, Ser: 1.12 mg/dL (ref 0.40–1.50)
GFR: 63.6 mL/min (ref >60.00)
Glucose, Bld: 90 mg/dL (ref 70–99)
Potassium: 4.8 mEq/L (ref 3.5–5.1)
Sodium: 140 mEq/L (ref 135–145)
Total Bilirubin: 0.8 mg/dL (ref 0.2–1.2)
Total Protein: 6.1 g/dL (ref 6.0–8.3)

## 2019-01-06 LAB — VITAMIN D 25 HYDROXY (VIT D DEFICIENCY, FRACTURES): VITD: 51.27 ng/mL (ref 30.00–100.00)

## 2019-01-06 LAB — PSA: PSA: 0.27 ng/mL (ref 0.10–4.00)

## 2019-01-06 LAB — VITAMIN B12: Vitamin B-12: 613 pg/mL (ref 211–911)

## 2019-01-06 NOTE — Addendum Note (Signed)
Addended by: Ria Bush on: 01/06/2019 10:35 AM   Modules accepted: Orders

## 2019-01-06 NOTE — Telephone Encounter (Signed)
Please place Neurology Referral for the patient. Velora Heckler can see him on 01/17/19.

## 2019-01-06 NOTE — Telephone Encounter (Signed)
Great! Referral placed.

## 2019-01-13 ENCOUNTER — Other Ambulatory Visit (INDEPENDENT_AMBULATORY_CARE_PROVIDER_SITE_OTHER): Payer: Self-pay | Admitting: Vascular Surgery

## 2019-01-13 DIAGNOSIS — Z95820 Peripheral vascular angioplasty status with implants and grafts: Secondary | ICD-10-CM

## 2019-01-17 ENCOUNTER — Ambulatory Visit (INDEPENDENT_AMBULATORY_CARE_PROVIDER_SITE_OTHER): Payer: PPO | Admitting: Nurse Practitioner

## 2019-01-17 ENCOUNTER — Encounter (INDEPENDENT_AMBULATORY_CARE_PROVIDER_SITE_OTHER): Payer: Self-pay | Admitting: Nurse Practitioner

## 2019-01-17 ENCOUNTER — Telehealth: Payer: PPO | Admitting: Neurology

## 2019-01-17 ENCOUNTER — Ambulatory Visit (INDEPENDENT_AMBULATORY_CARE_PROVIDER_SITE_OTHER): Payer: PPO

## 2019-01-17 ENCOUNTER — Other Ambulatory Visit: Payer: Self-pay

## 2019-01-17 VITALS — BP 129/65 | HR 64 | Resp 10 | Ht 68.0 in | Wt 212.0 lb

## 2019-01-17 DIAGNOSIS — E782 Mixed hyperlipidemia: Secondary | ICD-10-CM | POA: Diagnosis not present

## 2019-01-17 DIAGNOSIS — I739 Peripheral vascular disease, unspecified: Secondary | ICD-10-CM

## 2019-01-17 DIAGNOSIS — Z95820 Peripheral vascular angioplasty status with implants and grafts: Secondary | ICD-10-CM | POA: Diagnosis not present

## 2019-01-17 DIAGNOSIS — I10 Essential (primary) hypertension: Secondary | ICD-10-CM | POA: Diagnosis not present

## 2019-01-17 NOTE — Progress Notes (Signed)
SUBJECTIVE:  Patient ID: Randall Lofts., male    DOB: Nov 22, 1940, 78 y.o.   MRN: [id number] Chief Complaint  Patient presents with  . Follow-up    HPI  Randall Ali. is a 78 y.o. male that presents today after left lower extremity angiogram on 01/04/2019. Procedure(s) Performed:             1.  Abdominal aortogram             2.  Bilateral distal runoff             3.  Percutaneous transluminal angioplasty and stent placement right common iliac artery; "kissing balloon" technique             4.  Percutaneous transluminal and plasty and stent placement left common iliac artery; "kissing balloon" technique             5.  Percutaneous transluminal angioplasty bilateral external iliac arteries, right to 8 mm and left to 7 mm diameter.             6.  Ultrasound guided access bilateral common femoral arteries             7.  StarClose closure device bilateral common femoral arteries  Today the patient reports that his left lower extremity feels much better with ambulation however he still endorses having some pins-and-needles feeling in his foot.  He states that his right foot however still experiences significant claudication.  His claudication is lifestyle limiting for him.  He denies any fever, chills, nausea, vomiting or diarrhea.  He denies any current issues with his groin sites.  His right ABI 0.93 and his left is 1.02.  His previous ABI on 11/25/2018 was 0.83 and his left was 0.75.  He has biphasic waveforms in the bilateral tibial arteries with strong toe waveforms.  Past Medical History:  Diagnosis Date  . Allergy    seasonal  . Arthritis    all over- in general   . CAD (coronary artery disease)    a. inferior wall MI 10/01 s/p PCI/DES to RCA; b. Myoview 3/16 neg for ischemia; c. LHC 8/16: ostLAD 80%, OM1 70%, OM2 70% x 2 lesions, mRCA 30%, dRCA 70% s/p 4-V CABG 01/24/15 (LIMA-LAD, VG- OM1, VG-OM2, VG-PDA)   . Cancer (HCC)    skin, melanoma  . Carotid artery disease  ([place])    a. [place] 8/16: 1-39% bilateral ICA stenosis  . Cataract    removed  . Diastolic dysfunction    a. TTE 8/16: EF 55-60%, no RWMA, Gr1DD, calcified mitral annulus, mild biatrial enlargement  . Erectile dysfunction   . History of elbow surgery   . HLD (hyperlipidemia)   . HTN (hypertension)   . Inferior myocardial infarction ([place]) 10/01   stent RCA  . Postoperative wound infection 02/02/2015  . Reflux esophagitis   . Sleep apnea 2017   CPAP at night    Past Surgical History:  Procedure Laterality Date  . arm surgery  2010  . CARDIAC CATHETERIZATION  06/24/11  . CARDIAC CATHETERIZATION N/A 01/18/2015   Procedure: Left Heart Cath with coronary angiography;  Surgeon: Minna Merritts, MD;  Location: [facility];  Service: Cardiovascular;  Laterality: N/A;  . CARDIAC CATHETERIZATION N/A 01/18/2015   Procedure: Intravascular Pressure Wire/FFR Study;  Surgeon: [name], MD;  Location: [facility];  Service: Cardiovascular;  Laterality: N/A;  . CAROTID STENT  03/10/2011  . COLONOSCOPY  2010  .  COLONOSCOPY  06/14/2014   Dr Hilarie Fredrickson  . CORONARY ARTERY BYPASS GRAFT N/A 01/24/2015   Procedure: CORONARY ARTERY BYPASS GRAFTING x 4 (LIMA-LAD, SVG-Int 1- Int 2, SVG-PD) ENDOSCOPIC GREATER SAPHENOUS VEIN HARVEST LEFT LEG;  Surgeon: [name], MD;  Location: [facility];  Service: Open Heart Surgery;  Laterality: N/A;  . ESOPHAGOGASTRODUODENOSCOPY (EGD) WITH PROPOFOL N/A 04/24/2016   Procedure: ESOPHAGOGASTRODUODENOSCOPY (EGD) WITH PROPOFOL;  Surgeon: Jerene Bears, MD;  Location: [facility];  Service: Gastroenterology;  Laterality: N/A;  . EYE SURGERY     lasik 15 yrs. ago, cataracts removed - both eyes   . HAMMER TOE SURGERY     right toe  . LEFT HEART CATH AND CORONARY ANGIOGRAPHY Left 06/10/2017   Procedure: LEFT HEART CATH AND CORONARY ANGIOGRAPHY;  Surgeon: Minna Merritts, MD;  Location: [facility];  Service: Cardiovascular;  Laterality: Left;  . LOWER  EXTREMITY ANGIOGRAPHY Left 01/04/2019   Procedure: LOWER EXTREMITY ANGIOGRAPHY;  Surgeon: Katha Cabal, MD;  Location: [facility];  Service: Cardiovascular;  Laterality: Left;  . NASAL SINUS SURGERY  2008   septpolasty, bilateral turbinate reduction  . SHOULDER ARTHROSCOPY  2012  . TEE WITHOUT CARDIOVERSION N/A 01/24/2015   Procedure: TRANSESOPHAGEAL ECHOCARDIOGRAM (TEE);  Surgeon: [name], MD;  Location: [facility];  Service: Open Heart Surgery;  Laterality: N/A;  . [place]  . UPPER GI ENDOSCOPY  07/2014, 04-24-16   Dr Raquel James  . WRIST SURGERY  2011    Social History   Socioeconomic History  . Marital status: Single    Spouse name: Not on file  . Number of children: Not on file  . Years of education: 10  . Highest education level: Not on file  Occupational History  . Occupation: retired    Comment: Agricultural consultant  Social Needs  . Financial resource strain: Not on file  . Food insecurity    Worry: Not on file    Inability: Not on file  . Transportation needs    Medical: Not on file    Non-medical: Not on file  Tobacco Use  . Smoking status: Former Smoker    Packs/day: 2.50    Years: 40.00    Pack years: 100.00    Types: Cigarettes    Quit date: 03/24/2000    Years since quitting: 18.8  . Smokeless tobacco: Never Used  Substance and Sexual Activity  . Alcohol use: Yes    Alcohol/week: 10.0 standard drinks    Types: 10 Shots of liquor per week  . Drug use: No  . Sexual activity: Yes  Lifestyle  . Physical activity    Days per week: Not on file    Minutes per session: Not on file  . Stress: Not on file  Relationships  . Social Herbalist on phone: Not on file    Gets together: Not on file    Attends religious service: Not on file    Active member of club or organization: Not on file    Attends meetings of clubs or organizations: Not on file    Relationship status: Not on file  . Intimate partner violence    Fear of current or  ex partner: Not on file    Emotionally abused: Not on file    Physically abused: Not on file    Forced sexual activity: Not on file  Other Topics Concern  . Not on file  Social History Narrative  Singled; lives with son and dog    Occ: retired, back part time at [employer];    Activity: gym 4-5d/wk   Diet: good water, fruits/vegetables daily   Caffeine Use-yes    Family History  Problem Relation Age of Onset  . Hypertension Mother   . Heart disease Mother   . Hypertension Father   . Diabetes Father   . Heart disease Brother 26  . Cancer Paternal Grandfather   . Colon cancer Neg Hx   . Prostate cancer Neg Hx   . Bladder Cancer Neg Hx   . Kidney cancer Neg Hx     No Known Allergies   Review of Systems   Review of Systems: Negative Unless Checked Constitutional: [] Weight loss  [] Fever  [] Chills Cardiac: [] Chest pain   [x]  Atrial Fibrillation  [] Palpitations   [] Shortness of breath when laying flat   [] Shortness of breath with exertion. [] Shortness of breath at rest Vascular:  [x] Pain in legs with walking   [] Pain in legs with standing [] Pain in legs when laying flat   [x] Claudication    [] Pain in feet when laying flat    [] History of DVT   [] Phlebitis   [] Swelling in legs   [] Varicose veins   [] Non-healing ulcers Pulmonary:   [] Uses home oxygen   [] Productive cough   [] Hemoptysis   [] Wheeze  [] COPD   [] Asthma Neurologic:  [] Dizziness   [] Seizures  [] Blackouts [] History of stroke   [] History of TIA  [] Aphasia   [] Temporary Blindness   [] Weakness or numbness in arm   [] Weakness or numbness in leg Musculoskeletal:   [] Joint swelling   [] Joint pain   [] Low back pain  []  History of Knee Replacement [] Arthritis [] back Surgeries  []  Spinal Stenosis    Hematologic:  [] Easy bruising  [] Easy bleeding   [] Hypercoagulable state   [] Anemic Gastrointestinal:  [] Diarrhea   [] Vomiting  [] Gastroesophageal reflux/heartburn   [] Difficulty swallowing. [] Abdominal pain Genitourinary:  [] Chronic  kidney disease   [] Difficult urination  [] Anuric   [] Blood in urine [] Frequent urination  [] Burning with urination   [] Hematuria Skin:  [] Rashes   [] Ulcers [] Wounds Psychological:  [] History of anxiety   []  History of major depression  []  Memory Difficulties      OBJECTIVE:   Physical Exam  BP 129/65 (BP Location: Left Arm, Patient Position: Sitting, Cuff Size: Normal)   Pulse 64   Resp 10   Ht 5\' 8"  (1.727 m)   Wt 212 lb (96.2 kg)   BMI 32.23 kg/m   Gen: WD/WN, NAD Head: [place]/AT, No temporalis wasting.  Ear/Nose/Throat: Hearing grossly intact, nares w/o erythema or drainage Eyes: PER, EOMI, sclera nonicteric.  Neck: Supple, no masses.  No JVD.  Pulmonary:  Good air movement, no use of accessory muscles.  Cardiac: RRR Vascular:  Vessel Right Left  Radial Palpable Palpable  Dorsalis Pedis Palpable Palpable  Posterior Tibial Palpable Palpable   Gastrointestinal: soft, non-distended. No guarding/no peritoneal signs.  Musculoskeletal: M/S 5/5 throughout.  No deformity or atrophy.  Neurologic: Pain and light touch intact in extremities.  Symmetrical.  Speech is fluent. Motor exam as listed above. Psychiatric: Judgment intact, Mood & affect appropriate for pt's clinical situation. Dermatologic: No Venous rashes. No Ulcers Noted.  No changes consistent with cellulitis. Lymph : No Cervical lymphadenopathy, no lichenification or skin changes of chronic lymphedema.       ASSESSMENT AND PLAN:  1. PAD (peripheral artery disease) (HCC) Recommend:  The patient has experienced increased symptoms and is now  describing lifestyle limiting claudication and mild rest pain.  Given the severity of the patient's right lower extremity symptoms the patient should undergo angiography and intervention.  Risk and benefits were reviewed the patient.  Indications for the procedure were reviewed.  All questions were answered, the patient agrees to proceed.   The patient should continue walking and  begin a more formal exercise program.  The patient should continue antiplatelet therapy and aggressive treatment of the lipid abnormalities  Previous imaging indicates that the lesion in the right lower extremity may not be amenable to percutaneous intervention.  However prior to any surgical intervention pictures of the foot lower extremity are necessary, therefore an angiogram will be necessary to proceed with any surgical intervention.  This was explained to the patient that we will intervene on the lesion if possible however this could be a prelude to surgery.  The patient understands and agrees.  2. Essential hypertension Continue antihypertensive medications as already ordered, these medications have been reviewed and there are no changes at this time.   3. Mixed hyperlipidemia Continue statin as ordered and reviewed, no changes at this time    Current Outpatient Medications on File Prior to Visit  Medication Sig Dispense Refill  . aspirin 81 MG EC tablet Take 81 mg by mouth daily.      . Calcium-Magnesium-Vitamin D (CALCIUM 1200+D3 PO) Take 1 tablet by mouth daily.    . clopidogrel (PLAVIX) 75 MG tablet Take 1 tablet (75 mg total) by mouth daily. 30 tablet 5  . fluticasone (FLONASE) 50 MCG/ACT nasal spray Place 2 sprays into both nostrils daily. 16 g 3  . ibuprofen (ADVIL,MOTRIN) 200 MG tablet Take 400 mg by mouth every 6 (six) hours as needed for headache or moderate pain.     [name] lisinopril (ZESTRIL) 5 MG tablet TAKE 1 TABLET BY MOUTH EVERY DAY (Patient taking differently: Take 5 mg by mouth daily. ) 90 tablet 1  . loratadine (CLARITIN) 10 MG tablet Take 1 tablet (10 mg total) by mouth daily.    . metoprolol succinate (TOPROL-XL) 25 MG 24 hr tablet TAKE 1/2 TABLET BY MOUTH EVERY DAY (Patient taking differently: Take 12.5 mg by mouth daily. ) 45 tablet 0  . nitroGLYCERIN (NITROSTAT) 0.4 MG SL tablet Place 1 tablet (0.4 mg total) under the tongue every 5 (five) minutes as needed for  chest pain. 25 tablet 3  . pantoprazole (PROTONIX) 40 MG tablet Take 1 tablet (40 mg total) by mouth daily. 30 tablet 11  . simvastatin (ZOCOR) 40 MG tablet TAKE 1 TABLET(40 MG) BY MOUTH AT BEDTIME (Patient taking differently: Take 40 mg by mouth at bedtime. ) 90 tablet 0  . tamsulosin (FLOMAX) 0.4 MG CAPS capsule TAKE 1 CAPSULE BY MOUTH EVERY DAY AFTER SUPPER (Patient taking differently: Take 0.4 mg by mouth daily after supper. ) 90 capsule 1  . traMADol (ULTRAM) 50 MG tablet TAKE 1 TABLET BY MOUTH THREE TIMES DAILY 90 tablet 0  . vitamin B-12 (CYANOCOBALAMIN) 1000 MCG tablet Take 1,000 mcg by mouth daily.    . vitamin C (ASCORBIC ACID) 500 MG tablet Take 500 mg by mouth daily.    . vitamin E 400 UNIT capsule Take 400 Units by mouth daily.    . Zinc 100 MG TABS Take 100 mg by mouth daily.     . cholecalciferol (VITAMIN D) 1000 units tablet Take 1,000 Units by mouth daily.     No current facility-administered medications on file prior to visit.  There are no Patient Instructions on file for this visit. No follow-ups on file.   Kris Hartmann, NP  This note was completed with Sales executive.  Any errors are purely unintentional.

## 2019-01-18 ENCOUNTER — Encounter: Payer: Self-pay | Admitting: Family Medicine

## 2019-01-18 ENCOUNTER — Ambulatory Visit (INDEPENDENT_AMBULATORY_CARE_PROVIDER_SITE_OTHER): Payer: PPO | Admitting: Family Medicine

## 2019-01-18 ENCOUNTER — Telehealth (INDEPENDENT_AMBULATORY_CARE_PROVIDER_SITE_OTHER): Payer: Self-pay

## 2019-01-18 VITALS — BP 136/68 | HR 66 | Temp 98.2°F | Ht 68.0 in | Wt 207.3 lb

## 2019-01-18 DIAGNOSIS — L299 Pruritus, unspecified: Secondary | ICD-10-CM | POA: Diagnosis not present

## 2019-01-18 MED ORDER — HYDROXYZINE HCL 25 MG PO TABS: 12.5000 mg | ORAL_TABLET | Freq: Two times a day (BID) | ORAL | 0 refills | Status: DC | PRN

## 2019-01-18 NOTE — Telephone Encounter (Signed)
Spoke with the patient and he is now scheduled with Dr. Delana Meyer for 01/25/2019 with a 12:00 pm arrival time to the [facility]. Patient will do his Covid testing on 01/21/2019 between 12:30-2:30 pm at the [facility]. Pre-procedure instructions were discussed and this information will be mailed out to the patient.

## 2019-01-18 NOTE — Patient Instructions (Addendum)
Possibly medicine related ?plavix.  Touch base with vascular doctors about symptoms.  Take hydroxyzine as needed for itching - don't drive with it as it will make you sleepy.

## 2019-01-18 NOTE — Progress Notes (Signed)
This visit was conducted in person.  BP 136/68 (BP Location: Left Arm, Patient Position: Sitting, Cuff Size: Large)   Pulse 66   Temp 98.2 F (36.8 C) (Temporal)   Ht 5\' 8"  (1.727 m)   Wt 207 lb 5 oz (94 kg)   SpO2 98%   BMI 31.52 kg/m    CC: itching Subjective:    Patient ID: Randall Lofts., male    DOB: 1940-11-04, 78 y.o.   MRN: [id number]  HPI: Randall Saulter. is a 78 y.o. male presenting on 01/18/2019 for Pruritus (C/o severe itching on bilateral hands, feet and lateral sides of thighs.  Also, c/o burning in bilateral hands/feet. )   3d h/o bilateral hands/feet/legs, and R axilla. Also noticing burning of hands/feet. No paresthesias or numbness. Rubbing hands helps, putting socks on helps. Has been using aquaphor moisturizing cream. No rash. Mild dizziness this morning.   Recent PAD s/p 2 stents placed bilateral common iliac arteries 01/04/2019.  This past month plavix and pantoprazole (in place of omeprazole) were started, isosorbide was stopped.  He did go swimming yesterday - received lots of sun.  Denies other new medicines, supplements, vitamins, no new lotions, detergents, soaps or shampoos.      Relevant past medical, surgical, family and social history reviewed and updated as indicated. Interim medical history since our last visit reviewed. Allergies and medications reviewed and updated. Outpatient Medications Prior to Visit  Medication Sig Dispense Refill  . aspirin 81 MG EC tablet Take 81 mg by mouth daily.      . Calcium-Magnesium-Vitamin D (CALCIUM 1200+D3 PO) Take 1 tablet by mouth daily.    . cholecalciferol (VITAMIN D) 1000 units tablet Take 1,000 Units by mouth daily.    . clopidogrel (PLAVIX) 75 MG tablet Take 1 tablet (75 mg total) by mouth daily. 30 tablet 5  . fluticasone (FLONASE) 50 MCG/ACT nasal spray Place 2 sprays into both nostrils daily. 16 g 3  . ibuprofen (ADVIL,MOTRIN) 200 MG tablet Take 400 mg by mouth every 6 (six) hours as needed  for headache or moderate pain.     [name] lisinopril (ZESTRIL) 5 MG tablet TAKE 1 TABLET BY MOUTH EVERY DAY (Patient taking differently: Take 5 mg by mouth daily. ) 90 tablet 1  . metoprolol succinate (TOPROL-XL) 25 MG 24 hr tablet TAKE 1/2 TABLET BY MOUTH EVERY DAY (Patient taking differently: Take 12.5 mg by mouth daily. ) 45 tablet 0  . nitroGLYCERIN (NITROSTAT) 0.4 MG SL tablet Place 1 tablet (0.4 mg total) under the tongue every 5 (five) minutes as needed for chest pain. 25 tablet 3  . pantoprazole (PROTONIX) 40 MG tablet Take 1 tablet (40 mg total) by mouth daily. 30 tablet 11  . simvastatin (ZOCOR) 40 MG tablet TAKE 1 TABLET(40 MG) BY MOUTH AT BEDTIME (Patient taking differently: Take 40 mg by mouth at bedtime. ) 90 tablet 0  . tamsulosin (FLOMAX) 0.4 MG CAPS capsule TAKE 1 CAPSULE BY MOUTH EVERY DAY AFTER SUPPER (Patient taking differently: Take 0.4 mg by mouth daily after supper. ) 90 capsule 1  . traMADol (ULTRAM) 50 MG tablet TAKE 1 TABLET BY MOUTH THREE TIMES DAILY 90 tablet 0  . vitamin B-12 (CYANOCOBALAMIN) 1000 MCG tablet Take 1,000 mcg by mouth daily.    . vitamin C (ASCORBIC ACID) 500 MG tablet Take 500 mg by mouth daily.    . vitamin E 400 UNIT capsule Take 400 Units by mouth daily.    [name] Zinc  100 MG TABS Take 100 mg by mouth daily.     [name] loratadine (CLARITIN) 10 MG tablet Take 1 tablet (10 mg total) by mouth daily.     No facility-administered medications prior to visit.      Per HPI unless specifically indicated in ROS section below Review of Systems Objective:    BP 136/68 (BP Location: Left Arm, Patient Position: Sitting, Cuff Size: Large)   Pulse 66   Temp 98.2 F (36.8 C) (Temporal)   Ht 5\' 8"  (1.727 m)   Wt 207 lb 5 oz (94 kg)   SpO2 98%   BMI 31.52 kg/m   Wt Readings from Last 3 Encounters:  01/18/19 207 lb 5 oz (94 kg)  01/17/19 212 lb (96.2 kg)  01/04/19 207 lb (93.9 kg)    Physical Exam Vitals signs and nursing note reviewed.  Constitutional:       Appearance: Normal appearance. He is not ill-appearing.  Musculoskeletal: Normal range of motion.     Right lower leg: No edema.     Left lower leg: No edema.     Comments:  Diminished pulses bilaterally Maceration between toes bilaterally  Skin:    General: Skin is warm and dry.     Findings: Erythema and rash present.     Comments: Erythematous rash bilateral forearms without significant rash noted on hands or feet  Neurological:     Mental Status: He is alert.       Assessment & Plan:   Problem List Items Addressed This Visit    Pruritic condition - Primary    Pruritis possibly med related (plavix) vs possible sun dermatitis. rec limit sun exposure at this time. Doubt pantoprazole related. Will Rx pruritis with hydroxyzine. Did recommend touch base with [facility] about possible plavix side effect.           Meds ordered this encounter  Medications  . hydrOXYzine (ATARAX/VISTARIL) 25 MG tablet    Sig: Take 0.5-1 tablets (12.5-25 mg total) by mouth 2 (two) times daily as needed for itching.    Dispense:  30 tablet    Refill:  0   No orders of the defined types were placed in this encounter.  Patient Instructions  Possibly medicine related ?plavix.  Touch base with vascular doctors about symptoms.  Take hydroxyzine as needed for itching - don't drive with it as it will make you sleepy.    Follow up plan: No follow-ups on file.  Ria Bush, MD

## 2019-01-18 NOTE — Assessment & Plan Note (Signed)
Pruritis possibly med related (plavix) vs possible sun dermatitis. rec limit sun exposure at this time. Doubt pantoprazole related. Will Rx pruritis with hydroxyzine. Did recommend touch base with [facility] about possible plavix side effect.

## 2019-01-19 ENCOUNTER — Other Ambulatory Visit: Payer: Self-pay

## 2019-01-19 ENCOUNTER — Encounter: Payer: Self-pay | Admitting: Urology

## 2019-01-19 ENCOUNTER — Ambulatory Visit (INDEPENDENT_AMBULATORY_CARE_PROVIDER_SITE_OTHER): Payer: PPO | Admitting: Urology

## 2019-01-19 VITALS — BP 116/60 | HR 80 | Ht 68.0 in | Wt 207.0 lb

## 2019-01-19 DIAGNOSIS — M9901 Segmental and somatic dysfunction of cervical region: Secondary | ICD-10-CM | POA: Diagnosis not present

## 2019-01-19 DIAGNOSIS — N5201 Erectile dysfunction due to arterial insufficiency: Secondary | ICD-10-CM

## 2019-01-19 DIAGNOSIS — M5033 Other cervical disc degeneration, cervicothoracic region: Secondary | ICD-10-CM | POA: Diagnosis not present

## 2019-01-19 DIAGNOSIS — M9902 Segmental and somatic dysfunction of thoracic region: Secondary | ICD-10-CM | POA: Diagnosis not present

## 2019-01-19 DIAGNOSIS — N400 Enlarged prostate without lower urinary tract symptoms: Secondary | ICD-10-CM

## 2019-01-19 DIAGNOSIS — M62830 Muscle spasm of back: Secondary | ICD-10-CM | POA: Diagnosis not present

## 2019-01-19 NOTE — Patient Instructions (Signed)
Erectile Dysfunction Erectile dysfunction (ED) is the inability to get or keep an erection in order to have sexual intercourse. Erectile dysfunction may include:  Inability to get an erection.  Lack of enough hardness of the erection to allow penetration.  Loss of the erection before sex is finished. What are the causes? This condition may be caused by:  Certain medicines, such as: ? Pain relievers. ? Antihistamines. ? Antidepressants. ? Blood pressure medicines. ? Water pills (diuretics). ? Ulcer medicines. ? Muscle relaxants. ? Drugs.  Excessive drinking.  Psychological causes, such as: ? Anxiety. ? Depression. ? Sadness. ? Exhaustion. ? Performance fear. ? Stress.  Physical causes, such as: ? Artery problems. This may include diabetes, smoking, liver disease, or atherosclerosis. ? High blood pressure. ? Hormonal problems, such as low testosterone. ? Obesity. ? Nerve problems. This may include back or pelvic injuries, diabetes mellitus, multiple sclerosis, or Parkinson disease. What are the signs or symptoms? Symptoms of this condition include:  Inability to get an erection.  Lack of enough hardness of the erection to allow penetration.  Loss of the erection before sex is finished.  Normal erections at some times, but with frequent unsatisfactory episodes.  Low sexual satisfaction in either partner due to erection problems.  A curved penis occurring with erection. The curve may cause pain or the penis may be too curved to allow for intercourse.  Never having nighttime erections. How is this diagnosed? This condition is often diagnosed by:  Performing a physical exam to find other diseases or specific problems with the penis.  Asking you detailed questions about the problem.  Performing blood tests to check for diabetes mellitus or to measure hormone levels.  Performing other tests to check for underlying health conditions.  Performing an ultrasound  exam to check for scarring.  Performing a test to check blood flow to the penis.  Doing a sleep study at home to measure nighttime erections. How is this treated? This condition may be treated by:  Medicine taken by mouth to help you achieve an erection (oral medicine).  Hormone replacement therapy to replace low testosterone levels.  Medicine that is injected into the penis. Your health care provider may instruct you how to give yourself these injections at home.  Vacuum pump. This is a pump with a ring on it. The pump and ring are placed on the penis and used to create pressure that helps the penis become erect.  Penile implant surgery. In this procedure, you may receive: ? An inflatable implant. This consists of cylinders, a pump, and a reservoir. The cylinders can be inflated with a fluid that helps to create an erection, and they can be deflated after intercourse. ? A semi-rigid implant. This consists of two silicone rubber rods. The rods provide some rigidity. They are also flexible, so the penis can both curve downward in its normal position and become straight for sexual intercourse.  Blood vessel surgery, to improve blood flow to the penis. During this procedure, a blood vessel from a different part of the body is placed into the penis to allow blood to flow around (bypass) damaged or blocked blood vessels.  Lifestyle changes, such as exercising more, losing weight, and quitting smoking. Follow these instructions at home: Medicines   Take over-the-counter and prescription medicines only as told by your health care provider. Do not increase the dosage without first discussing it with your health care provider.  If you are using self-injections, perform injections as directed by your  health care provider. Make sure to avoid any veins that are on the surface of the penis. After giving an injection, apply pressure to the injection site for 5 minutes. General instructions   Exercise regularly, as directed by your health care provider. Work with your health care provider to lose weight, if needed.  Do not use any products that contain nicotine or tobacco, such as cigarettes and e-cigarettes. If you need help quitting, ask your health care provider.  Before using a vacuum pump, read the instructions that come with the pump and discuss any questions with your health care provider.  Keep all follow-up visits as told by your health care provider. This is important. Contact a health care provider if:  You feel nauseous.  You vomit. Get help right away if:  You are taking oral or injectable medicines and you have an erection that lasts longer than 4 hours. If your health care provider is unavailable, go to the nearest emergency room for evaluation. An erection that lasts much longer than 4 hours can result in permanent damage to your penis.  You have severe pain in your groin or abdomen.  You develop redness or severe swelling of your penis.  You have redness spreading up into your groin or lower abdomen.  You are unable to urinate.  You experience chest pain or a rapid heart beat (palpitations) after taking oral medicines. Summary  Erectile dysfunction (ED) is the inability to get or keep an erection during sexual intercourse. This problem can usually be treated successfully.  This condition is diagnosed based on a physical exam, your symptoms, and tests to determine the cause. Treatment varies depending on the cause, and may include medicines, hormone therapy, surgery, or vacuum pump.  You may need follow-up visits to make sure that you are using your medicines or devices correctly.  Get help right away if you are taking or injecting medicines and you have an erection that lasts longer than 4 hours. This information is not intended to replace advice given to you by your health care provider. Make sure you discuss any questions you have with your health care  provider. Document Released: 05/23/2000 Document Revised: 05/08/2017 Document Reviewed: 06/11/2016 Elsevier Patient Education  2020 [employer].

## 2019-01-19 NOTE — Progress Notes (Signed)
01/19/2019 10:01 AM   Randall Ali 1940-08-17 [id number]  Referring provider: Ria Bush, MD [address] [place],  [place] [address]  Chief Complaint  Patient presents with  . Erectile Dysfunction    HPI:  F/u -   ED - He is unable to obtain or maintain sufficient erection for intercourse. He notes that he gets about three quarters of its full thickness. He notes occasional morning erections. T was 648 in 2016. Associated with multiple medical comorbidities includingfour-vessel coronary artery disease status post CABG in August 2016and PADwho is currently on nitrates. He has triedboth Viagra and Cialis which he found ineffective. He apparently obtainedthese from a pharmacy outside of this country. Due to the nitrates, he was started on penile injection therapy Feb 2019 after clearance from [name] who felt pt is at acceptable risk for penile injections. Injections work well but inconvenient for patient.   He returns and is not using anything for ED. His erections are not satisfactory. SHIM = 9-10. No trouble voiding. No gross hematuria or UTI/dysuria. He has PVD and got "two stents" in right leg recently.    PMH: Past Medical History:  Diagnosis Date  . Allergy    seasonal  . Arthritis    all over- in general   . CAD (coronary artery disease)    a. inferior wall MI 10/01 s/p PCI/DES to RCA; b. Myoview 3/16 neg for ischemia; c. LHC 8/16: ostLAD 80%, OM1 70%, OM2 70% x 2 lesions, mRCA 30%, dRCA 70% s/p 4-V CABG 01/24/15 (LIMA-LAD, VG- OM1, VG-OM2, VG-PDA)   . Cancer (HCC)    skin, melanoma  . Carotid artery disease ([place])    a. [place] 8/16: 1-39% bilateral ICA stenosis  . Cataract    removed  . Diastolic dysfunction    a. TTE 8/16: EF 55-60%, no RWMA, Gr1DD, calcified mitral annulus, mild biatrial enlargement  . Erectile dysfunction   . History of elbow surgery   . HLD (hyperlipidemia)   . HTN (hypertension)   . Inferior myocardial infarction  ([place]) 10/01   stent RCA  . Postoperative wound infection 02/02/2015  . Reflux esophagitis   . Sleep apnea 2017   CPAP at night    Surgical History: Past Surgical History:  Procedure Laterality Date  . arm surgery  2010  . CARDIAC CATHETERIZATION  06/24/11  . CARDIAC CATHETERIZATION N/A 01/18/2015   Procedure: Left Heart Cath with coronary angiography;  Surgeon: Minna Merritts, MD;  Location: [facility];  Service: Cardiovascular;  Laterality: N/A;  . CARDIAC CATHETERIZATION N/A 01/18/2015   Procedure: Intravascular Pressure Wire/FFR Study;  Surgeon: [name], MD;  Location: [facility];  Service: Cardiovascular;  Laterality: N/A;  . CAROTID STENT  03/10/2011  . COLONOSCOPY  2010  . COLONOSCOPY  06/14/2014   Dr Hilarie Fredrickson  . CORONARY ARTERY BYPASS GRAFT N/A 01/24/2015   Procedure: CORONARY ARTERY BYPASS GRAFTING x 4 (LIMA-LAD, SVG-Int 1- Int 2, SVG-PD) ENDOSCOPIC GREATER SAPHENOUS VEIN HARVEST LEFT LEG;  Surgeon: [name], MD;  Location: [facility];  Service: Open Heart Surgery;  Laterality: N/A;  . ESOPHAGOGASTRODUODENOSCOPY (EGD) WITH PROPOFOL N/A 04/24/2016   Procedure: ESOPHAGOGASTRODUODENOSCOPY (EGD) WITH PROPOFOL;  Surgeon: Jerene Bears, MD;  Location: [facility];  Service: Gastroenterology;  Laterality: N/A;  . EYE SURGERY     lasik 15 yrs. ago, cataracts removed - both eyes   . HAMMER TOE SURGERY     right toe  . LEFT  HEART CATH AND CORONARY ANGIOGRAPHY Left 06/10/2017   Procedure: LEFT HEART CATH AND CORONARY ANGIOGRAPHY;  Surgeon: Minna Merritts, MD;  Location: [facility];  Service: Cardiovascular;  Laterality: Left;  . LOWER EXTREMITY ANGIOGRAPHY Left 01/04/2019   Procedure: LOWER EXTREMITY ANGIOGRAPHY;  Surgeon: Katha Cabal, MD;  Location: [facility];  Service: Cardiovascular;  Laterality: Left;  . NASAL SINUS SURGERY  2008   septpolasty, bilateral turbinate reduction  . SHOULDER ARTHROSCOPY  2012  . TEE WITHOUT  CARDIOVERSION N/A 01/24/2015   Procedure: TRANSESOPHAGEAL ECHOCARDIOGRAM (TEE);  Surgeon: [name], MD;  Location: [facility];  Service: Open Heart Surgery;  Laterality: N/A;  . [place]  . UPPER GI ENDOSCOPY  07/2014, 04-24-16   Dr Raquel James  . WRIST SURGERY  2011    Home Medications:  Allergies as of 01/19/2019   No Known Allergies     Medication List       Accurate as of January 19, 2019 10:01 AM. If you have any questions, ask your nurse or doctor.        STOP taking these medications   fluticasone 50 MCG/ACT nasal spray Commonly known as: FLONASE Stopped by: Festus Aloe, MD     TAKE these medications   aspirin 81 MG EC tablet Take 81 mg by mouth daily.   CALCIUM 1200+D3 PO Take 1 tablet by mouth daily.   cholecalciferol 1000 units tablet Commonly known as: VITAMIN D Take 1,000 Units by mouth daily.   clopidogrel 75 MG tablet Commonly known as: Plavix Take 1 tablet (75 mg total) by mouth daily.   hydrOXYzine 25 MG tablet Commonly known as: ATARAX/VISTARIL Take 0.5-1 tablets (12.5-25 mg total) by mouth 2 (two) times daily as needed for itching.   ibuprofen 200 MG tablet Commonly known as: ADVIL Take 400 mg by mouth every 6 (six) hours as needed for headache or moderate pain.   lisinopril 5 MG tablet Commonly known as: ZESTRIL TAKE 1 TABLET BY MOUTH EVERY DAY   metoprolol succinate 25 MG 24 hr tablet Commonly known as: TOPROL-XL TAKE 1/2 TABLET BY MOUTH EVERY DAY   nitroGLYCERIN 0.4 MG SL tablet Commonly known as: NITROSTAT Place 1 tablet (0.4 mg total) under the tongue every 5 (five) minutes as needed for chest pain.   pantoprazole 40 MG tablet Commonly known as: Protonix Take 1 tablet (40 mg total) by mouth daily.   simvastatin 40 MG tablet Commonly known as: ZOCOR TAKE 1 TABLET(40 MG) BY MOUTH AT BEDTIME What changed: See the new instructions.   tamsulosin 0.4 MG Caps capsule Commonly known as: FLOMAX TAKE 1 CAPSULE BY MOUTH  EVERY DAY AFTER SUPPER What changed: See the new instructions.   traMADol 50 MG tablet Commonly known as: ULTRAM TAKE 1 TABLET BY MOUTH THREE TIMES DAILY   vitamin B-12 1000 MCG tablet Commonly known as: CYANOCOBALAMIN Take 1,000 mcg by mouth daily.   vitamin C 500 MG tablet Commonly known as: ASCORBIC ACID Take 500 mg by mouth daily.   vitamin E 400 UNIT capsule Take 400 Units by mouth daily.   Zinc 100 MG Tabs Take 100 mg by mouth daily.       Allergies: No Known Allergies  Family History: Family History  Problem Relation Age of Onset  . Hypertension Mother   . Heart disease Mother   . Hypertension Father   . Diabetes Father   . Heart disease Brother 34  . Cancer Paternal Grandfather   .  Colon cancer Neg Hx   . Prostate cancer Neg Hx   . Bladder Cancer Neg Hx   . Kidney cancer Neg Hx     Social History:  reports that he quit smoking about 18 years ago. His smoking use included cigarettes. He has a 100.00 pack-year smoking history. He has never used smokeless tobacco. He reports current alcohol use of about 10.0 standard drinks of alcohol per week. He reports that he does not use drugs.  ROS: UROLOGY Frequent Urination?: No Hard to postpone urination?: No Burning/pain with urination?: No Get up at night to urinate?: No Leakage of urine?: No Urine stream starts and stops?: No Trouble starting stream?: No Do you have to strain to urinate?: No Blood in urine?: No Urinary tract infection?: No Sexually transmitted disease?: No Injury to kidneys or bladder?: No Painful intercourse?: No Weak stream?: No Erection problems?: Yes Penile pain?: No  Gastrointestinal Nausea?: No Vomiting?: No Indigestion/heartburn?: No Diarrhea?: No Constipation?: No  Constitutional Fever: No Night sweats?: No Weight loss?: No Fatigue?: No  Skin Skin rash/lesions?: No Itching?: No  Eyes Blurred vision?: No Double vision?: No  Ears/Nose/Throat Sore throat?: No  Sinus problems?: No  Hematologic/Lymphatic Swollen glands?: No Easy bruising?: Yes  Cardiovascular Leg swelling?: No Chest pain?: No  Respiratory Cough?: No Shortness of breath?: No  Endocrine Excessive thirst?: No  Musculoskeletal Back pain?: Yes Joint pain?: No  Neurological Headaches?: No Dizziness?: No  Psychologic Depression?: No Anxiety?: No  Physical Exam: BP 116/60 (BP Location: Left Arm, Patient Position: Sitting, Cuff Size: Normal)   Pulse 80   Ht 5\' 8"  (1.727 m)   Wt 93.9 kg   BMI 31.47 kg/m   Constitutional:  Alert and oriented, No acute distress. HEENT: [place] AT, moist mucus membranes.  Trachea midline, no masses. Cardiovascular: No clubbing, cyanosis, or edema. Respiratory: Normal respiratory effort, no increased work of breathing. GI: Abdomen is soft, nontender, nondistended, no abdominal masses GU: No CVA tenderness; Penis circumcised, normal foreskin, testicles descended bilaterally and palpably normal, bilateral epididymis palpably normal, scrotum normal. DRE: Prostate 40 g, smooth without hard area or nodule Lymph: No cervical or inguinal lymphadenopathy. Skin: No rashes, bruises or suspicious lesions. Neurologic: Grossly intact, no focal deficits, moving all 4 extremities. Psychiatric: Normal mood and affect.  Laboratory Data: Lab Results  Component Value Date   WBC 8.1 01/06/2019   HGB 14.7 01/06/2019   HCT 43.5 01/06/2019   MCV 92.3 01/06/2019   PLT 170.0 01/06/2019    Lab Results  Component Value Date   CREATININE 1.12 01/06/2019    Lab Results  Component Value Date   PSA 0.27 01/06/2019   PSA 0.39 08/24/2017   PSA 0.37 07/14/2016    Lab Results  Component Value Date   TESTOSTERONE 648 11/24/2014    Lab Results  Component Value Date   HGBA1C 5.6 08/24/2017    Urinalysis    Component Value Date/Time   COLORURINE ORANGE (A) 04/13/2015 1001   APPEARANCEUR CLEAR 04/13/2015 1001   LABSPEC 1.010 04/13/2015 1001    PHURINE 6.0 04/13/2015 1001   GLUCOSEU NEGATIVE 04/13/2015 1001   HGBUR NEGATIVE 04/13/2015 1001   BILIRUBINUR negative 08/24/2017 1546   KETONESUR NEGATIVE 04/13/2015 1001   PROTEINUR negative 08/24/2017 1546   PROTEINUR NEGATIVE 01/22/2015 1023   UROBILINOGEN 0.2 08/24/2017 1546   UROBILINOGEN 0.2 04/13/2015 1001   NITRITE negative 08/24/2017 1546   NITRITE NEGATIVE 04/13/2015 1001   LEUKOCYTESUR Negative 08/24/2017 1546    No results found for:  LABMICR, WBCUA, RBCUA, LABEPIT, MUCUS, BACTERIA  Pertinent Imaging: n/a No results found for this or any previous visit. No results found for this or any previous visit. No results found for this or any previous visit. No results found for this or any previous visit. No results found for this or any previous visit. No results found for this or any previous visit. No results found for this or any previous visit. No results found for this or any previous visit.  Assessment & Plan:    ED - discussed the nature r/b of pde5i, Muse, injections,, VED/ring and IPP.   BPH - normal exam.   No follow-ups on file.  Festus Aloe, MD  [facility] [address], [place], [place] [address] [phone]

## 2019-01-21 ENCOUNTER — Other Ambulatory Visit
Admission: RE | Admit: 2019-01-21 | Discharge: 2019-01-21 | Disposition: A | Discharge status: Home / Self Care | Payer: PPO | Source: Ambulatory Visit | Attending: Vascular Surgery | Admitting: Vascular Surgery

## 2019-01-21 ENCOUNTER — Other Ambulatory Visit: Payer: Self-pay

## 2019-01-21 DIAGNOSIS — Z01812 Encounter for preprocedural laboratory examination: Secondary | ICD-10-CM | POA: Insufficient documentation

## 2019-01-21 DIAGNOSIS — Z20828 Contact with and (suspected) exposure to other viral communicable diseases: Secondary | ICD-10-CM | POA: Insufficient documentation

## 2019-01-22 LAB — SARS CORONAVIRUS 2 (TAT 6-24 HRS): SARS Coronavirus 2: NEGATIVE

## 2019-01-23 NOTE — Progress Notes (Unsigned)
Virtual Visit via Video Note The purpose of this virtual visit is to provide medical care while limiting exposure to the novel coronavirus.    Consent was obtained for video visit:  Yes Answered questions that patient had about telehealth interaction:  Yes I discussed the limitations, risks, security and privacy concerns of performing an evaluation and management service by telemedicine. I also discussed with the patient that there may be a patient responsible charge related to this service. The patient expressed understanding and agreed to proceed.  Pt location: Home Physician Location: Home Name of referring provider:  Ria Bush, MD I connected with Randall Lofts. at patients initiation/request on 01/24/2019 at  9:10 AM EDT by video enabled telemedicine application and verified that I am speaking with the correct person using two identifiers. Pt MRN:  [id number] Pt DOB:  September 12, 1940 Video Participants:  Randall Lofts.   History of Present Illness:  Randall Ali is a 78 year old male who presents for headaches.  History supplemented by referring provider note.    He started having ***.  He has history of sinus surgery in 2008 and these headaches were initially treated for presumed sinusitis with augmentin, amoxicillin and prednisone with only transient improvement while on antibiotics.  They are associated with ***.  They are not associated with nausea, vomiting, photophobia, phonophobia, visual disturbance, nasal congestion, rhinorrhea, fever, chills, or unilateral numbness or weakness.  Initially, he had been treating headaches with daily use of Flonase, Claritin and ibuprofen.   He was evaluated by ENT.  CT scan demonstrated that sinuses were clear.  He was evaluated by the eye doctor and told he had an unremarkable exam.    He previously had an MRI of the brain with and without contrast on 09/23/16 to evaluate hearing loss, which was personally reviewed and was  unremarkable.  Current NSAIDS:  ASA 81mg , ibuprofen Current analgesics:  tramadol Current triptans:  none Current ergotamine:  none Current anti-emetic:  none Current muscle relaxants:  none Current anti-anxiolytic:  none Current sleep aide:  none Current Antihypertensive medications:  Toprol-XL, lisinopril Current Antidepressant medications:  none Current Anticonvulsant medications:  none Current anti-CGRP:  none Current Vitamins/Herbal/Supplements:  D, C, E, Zinc Current Antihistamines/Decongestants:  Flonase, Claritin Other therapy:  ***  Past NSAIDS:  *** Past analgesics:  *** Past abortive triptans:  none Past abortive ergotamine:  none Past muscle relaxants:  none Past anti-emetic:  Phenergan Past antihypertensive medications:  none Past antidepressant medications:  Wellbutrin Past anticonvulsant medications:  Gabapentin 100mg  Past anti-CGRP:  none Past vitamins/Herbal/Supplements:  none Past antihistamines/decongestants:  none Other past therapies:  ***  Caffeine:  *** Alcohol:  *** Smoker:  *** Diet:  *** Exercise:  *** Depression:  ***; Anxiety:  *** Other pain:  *** Sleep hygiene:  *** Family history of headache:  ***  Past Medical History: Past Medical History:  Diagnosis Date  . Allergy    seasonal  . Arthritis    all over- in general   . CAD (coronary artery disease)    a. inferior wall MI 10/01 s/p PCI/DES to RCA; b. Myoview 3/16 neg for ischemia; c. LHC 8/16: ostLAD 80%, OM1 70%, OM2 70% x 2 lesions, mRCA 30%, dRCA 70% s/p 4-V CABG 01/24/15 (LIMA-LAD, VG- OM1, VG-OM2, VG-PDA)   . Cancer (HCC)    skin, melanoma  . Carotid artery disease ([place])    a. [place] 8/16: 1-39% bilateral ICA stenosis  . Cataract    removed  .  Diastolic dysfunction    a. TTE 8/16: EF 55-60%, no RWMA, Gr1DD, calcified mitral annulus, mild biatrial enlargement  . Erectile dysfunction   . History of elbow surgery   . HLD (hyperlipidemia)   . HTN (hypertension)   . 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 Postoperative wound infection 02/02/2015  . Reflux esophagitis   . Sleep apnea 2017   CPAP at night    Medications: Outpatient Encounter Medications as of 01/24/2019  Medication Sig  . aspirin 81 MG EC tablet Take 81 mg by mouth daily.    . Calcium-Magnesium-Vitamin D (CALCIUM 1200+D3 PO) Take 1 tablet by mouth daily.  . cholecalciferol (VITAMIN D) 1000 units tablet Take 1,000 Units by mouth daily.  . clopidogrel (PLAVIX) 75 MG tablet Take 1 tablet (75 mg total) by mouth daily. (Patient taking differently: Take 75 mg by mouth at bedtime. )  . hydrOXYzine (ATARAX/VISTARIL) 25 MG tablet Take 0.5-1 tablets (12.5-25 mg total) by mouth 2 (two) times daily as needed for itching. (Patient not taking: Reported on 01/19/2019)  . ibuprofen (ADVIL,MOTRIN) 200 MG tablet Take 400 mg by mouth every 6 (six) hours as needed for headache or moderate pain.   [name] lisinopril (ZESTRIL) 5 MG tablet TAKE 1 TABLET BY MOUTH EVERY DAY (Patient taking differently: Take 5 mg by mouth every evening. )  . metoprolol succinate (TOPROL-XL) 25 MG 24 hr tablet TAKE 1/2 TABLET BY MOUTH EVERY DAY (Patient taking differently: Take 12.5 mg by mouth every evening. )  . nitroGLYCERIN (NITROSTAT) 0.4 MG SL tablet Place 1 tablet (0.4 mg total) under the tongue every 5 (five) minutes as needed for chest pain.  . pantoprazole (PROTONIX) 40 MG tablet Take 1 tablet (40 mg total) by mouth daily.  Vladimir Faster Glycol-Propyl Glycol (LUBRICANT EYE DROPS) 0.4-0.3 % SOLN Place 1-2 drops into both eyes 3 (three) times daily as needed (burning eyes.).  [name] simvastatin (ZOCOR) 40 MG tablet TAKE 1 TABLET(40 MG) BY MOUTH AT BEDTIME (Patient taking differently: Take 40 mg by mouth at bedtime. )  . tamsulosin (FLOMAX) 0.4 MG CAPS capsule TAKE 1 CAPSULE BY MOUTH EVERY DAY AFTER SUPPER (Patient taking differently: Take 0.4 mg by mouth daily after supper. )  . traMADol (ULTRAM) 50 MG tablet TAKE 1 TABLET BY MOUTH  THREE TIMES DAILY (Patient taking differently: Take 50 mg by mouth 3 (three) times daily as needed. )  . vitamin B-12 (CYANOCOBALAMIN) 1000 MCG tablet Take 1,000 mcg by mouth daily.  . vitamin C (ASCORBIC ACID) 500 MG tablet Take 500 mg by mouth daily.  . vitamin E 400 UNIT capsule Take 400 Units by mouth daily.  . Zinc 100 MG TABS Take 100 mg by mouth daily.    No facility-administered encounter medications on file as of 01/24/2019.     Allergies: No Known Allergies  Family History: Family History  Problem Relation Age of Onset  . Hypertension Mother   . Heart disease Mother   . Hypertension Father   . Diabetes Father   . Heart disease Brother 28  . Cancer Paternal Grandfather   . Colon cancer Neg Hx   . Prostate cancer Neg Hx   . Bladder Cancer Neg Hx   . Kidney cancer Neg Hx     Social History: Social History   Socioeconomic History  . Marital status: Single    Spouse name: Not on file  . Number of children: Not on file  . Years of education: 87  .  Highest education level: Not on file  Occupational History  . Occupation: retired    Comment: Agricultural consultant  Social Needs  . Financial resource strain: Not on file  . Food insecurity    Worry: Not on file    Inability: Not on file  . Transportation needs    Medical: Not on file    Non-medical: Not on file  Tobacco Use  . Smoking status: Former Smoker    Packs/day: 2.50    Years: 40.00    Pack years: 100.00    Types: Cigarettes    Quit date: 03/24/2000    Years since quitting: 18.8  . Smokeless tobacco: Never Used  Substance and Sexual Activity  . Alcohol use: Yes    Alcohol/week: 10.0 standard drinks    Types: 10 Shots of liquor per week  . Drug use: No  . Sexual activity: Yes  Lifestyle  . Physical activity    Days per week: Not on file    Minutes per session: Not on file  . Stress: Not on file  Relationships  . Social Herbalist on phone: Not on file    Gets together: Not on file    Attends  religious service: Not on file    Active member of club or organization: Not on file    Attends meetings of clubs or organizations: Not on file    Relationship status: Not on file  . Intimate partner violence    Fear of current or ex partner: Not on file    Emotionally abused: Not on file    Physically abused: Not on file    Forced sexual activity: Not on file  Other Topics Concern  . Not on file  Social History Narrative   Singled; lives with son and dog    Occ: retired, back part time at [employer];    Activity: gym 4-5d/wk   Diet: good water, fruits/vegetables daily   Caffeine Use-yes    Observations/Objective:   *** No acute distress.  Alert and oriented.  Speech fluent and not dysarthric.  Language intact.  Eyes orthophoric on primary gaze.  Face symmetric.  Assessment and Plan:   ***  Follow Up Instructions:    -I discussed the assessment and treatment plan with the patient. The patient was provided an opportunity to ask questions and all were answered. The patient agreed with the plan and demonstrated an understanding of the instructions.   The patient was advised to call back or seek an in-person evaluation if the symptoms worsen or if the condition fails to improve as anticipated.  Dudley Major, DO

## 2019-01-24 ENCOUNTER — Other Ambulatory Visit: Payer: Self-pay

## 2019-01-24 ENCOUNTER — Other Ambulatory Visit: Payer: PPO

## 2019-01-24 ENCOUNTER — Ambulatory Visit: Payer: PPO

## 2019-01-24 ENCOUNTER — Ambulatory Visit (INDEPENDENT_AMBULATORY_CARE_PROVIDER_SITE_OTHER): Payer: PPO

## 2019-01-24 ENCOUNTER — Telehealth: Payer: PPO | Admitting: Neurology

## 2019-01-24 VITALS — Ht 68.0 in | Wt 208.0 lb

## 2019-01-24 DIAGNOSIS — Z0000 Encounter for general adult medical examination without abnormal findings: Secondary | ICD-10-CM | POA: Diagnosis not present

## 2019-01-24 MED ORDER — NONFORMULARY OR COMPOUNDED ITEM: 0 refills | Status: DC

## 2019-01-24 NOTE — Patient Instructions (Signed)
[name] , Thank you for taking time to come for your Medicare Wellness Visit. I appreciate your ongoing commitment to your health goals. Please review the following plan we discussed and let me know if I can assist you in the future.   Screening recommendations/referrals: Colonoscopy: not required Recommended yearly ophthalmology/optometry visit for glaucoma screening and checkup Recommended yearly dental visit for hygiene and checkup  Vaccinations: Influenza vaccine: 03/2018 Pneumococcal vaccine: 04/2015 Tdap vaccine: 04/2015 Shingles vaccine: discussed    Advanced directives: Please bring a copy of your POA (Power of May) and/or Living Will to your next appointment.    Conditions/risks identified: obesity  Next appointment: 01/31/2019 at 11:30  Preventive Care 77 Years and Older, Male Preventive care refers to lifestyle choices and visits with your health care provider that can promote health and wellness. What does preventive care include?  A yearly physical exam. This is also called an annual well check.  Dental exams once or twice a year.  Routine eye exams. Ask your health care provider how often you should have your eyes checked.  Personal lifestyle choices, including:  Daily care of your teeth and gums.  Regular physical activity.  Eating a healthy diet.  Avoiding tobacco and drug use.  Limiting alcohol use.  Practicing safe sex.  Taking low doses of aspirin every day.  Taking vitamin and mineral supplements as recommended by your health care provider. What happens during an annual well check? The services and screenings done by your health care provider during your annual well check will depend on your age, overall health, lifestyle risk factors, and family history of disease. Counseling  Your health care provider may ask you questions about your:  Alcohol use.  Tobacco use.  Drug use.  Emotional well-being.  Home and relationship  well-being.  Sexual activity.  Eating habits.  History of falls.  Memory and ability to understand (cognition).  Work and work Statistician. Screening  You may have the following tests or measurements:  Height, weight, and BMI.  Blood pressure.  Lipid and cholesterol levels. These may be checked every 5 years, or more frequently if you are over 77 years old.  Skin check.  Lung cancer screening. You may have this screening every year starting at age 15 if you have a 30-pack-year history of smoking and currently smoke or have quit within the past 15 years.  Fecal occult blood test (FOBT) of the stool. You may have this test every year starting at age 77.  Flexible sigmoidoscopy or colonoscopy. You may have a sigmoidoscopy every 5 years or a colonoscopy every 10 years starting at age 77.  Prostate cancer screening. Recommendations will vary depending on your family history and other risks.  Hepatitis C blood test.  Hepatitis B blood test.  Sexually transmitted disease (STD) testing.  Diabetes screening. This is done by checking your blood sugar (glucose) after you have not eaten for a while (fasting). You may have this done every 1-3 years.  Abdominal aortic aneurysm (AAA) screening. You may need this if you are a current or former smoker.  Osteoporosis. You may be screened starting at age 1 if you are at high risk. Talk with your health care provider about your test results, treatment options, and if necessary, the need for more tests. Vaccines  Your health care provider may recommend certain vaccines, such as:  Influenza vaccine. This is recommended every year.  Tetanus, diphtheria, and acellular pertussis (Tdap, Td) vaccine. You may need a Td booster  every 10 years.  Zoster vaccine. You may need this after age 77.  Pneumococcal 13-valent conjugate (PCV13) vaccine. One dose is recommended after age 40.  Pneumococcal polysaccharide (PPSV23) vaccine. One dose is  recommended after age 73. Talk to your health care provider about which screenings and vaccines you need and how often you need them. This information is not intended to replace advice given to you by your health care provider. Make sure you discuss any questions you have with your health care provider. Document Released: 06/22/2015 Document Revised: 02/13/2016 Document Reviewed: 03/27/2015 Elsevier Interactive Patient Education  2017 [place] Prevention in the Home Falls can cause injuries. They can happen to people of all ages. There are many things you can do to make your home safe and to help prevent falls. What can I do on the outside of my home?  Regularly fix the edges of walkways and driveways and fix any cracks.  Remove anything that might make you trip as you walk through a door, such as a raised step or threshold.  Trim any bushes or trees on the path to your home.  Use bright outdoor lighting.  Clear any walking paths of anything that might make someone trip, such as rocks or tools.  Regularly check to see if handrails are loose or broken. Make sure that both sides of any steps have handrails.  Any raised decks and porches should have guardrails on the edges.  Have any leaves, snow, or ice cleared regularly.  Use sand or salt on walking paths during winter.  Clean up any spills in your garage right away. This includes oil or grease spills. What can I do in the bathroom?  Use night lights.  Install grab bars by the toilet and in the tub and shower. Do not use towel bars as grab bars.  Use non-skid mats or decals in the tub or shower.  If you need to sit down in the shower, use a plastic, non-slip stool.  Keep the floor dry. Clean up any water that spills on the floor as soon as it happens.  Remove soap buildup in the tub or shower regularly.  Attach bath mats securely with double-sided non-slip rug tape.  Do not have throw rugs and other things on  the floor that can make you trip. What can I do in the bedroom?  Use night lights.  Make sure that you have a light by your bed that is easy to reach.  Do not use any sheets or blankets that are too big for your bed. They should not hang down onto the floor.  Have a firm chair that has side arms. You can use this for support while you get dressed.  Do not have throw rugs and other things on the floor that can make you trip. What can I do in the kitchen?  Clean up any spills right away.  Avoid walking on wet floors.  Keep items that you use a lot in easy-to-reach places.  If you need to reach something above you, use a strong step stool that has a grab bar.  Keep electrical cords out of the way.  Do not use floor polish or wax that makes floors slippery. If you must use wax, use non-skid floor wax.  Do not have throw rugs and other things on the floor that can make you trip. What can I do with my stairs?  Do not leave any items on the stairs.  Make  sure that there are handrails on both sides of the stairs and use them. Fix handrails that are broken or loose. Make sure that handrails are as long as the stairways.  Check any carpeting to make sure that it is firmly attached to the stairs. Fix any carpet that is loose or worn.  Avoid having throw rugs at the top or bottom of the stairs. If you do have throw rugs, attach them to the floor with carpet tape.  Make sure that you have a light switch at the top of the stairs and the bottom of the stairs. If you do not have them, ask someone to add them for you. What else can I do to help prevent falls?  Wear shoes that:  Do not have high heels.  Have rubber bottoms.  Are comfortable and fit you well.  Are closed at the toe. Do not wear sandals.  If you use a stepladder:  Make sure that it is fully opened. Do not climb a closed stepladder.  Make sure that both sides of the stepladder are locked into place.  Ask someone to  hold it for you, if possible.  Clearly mark and make sure that you can see:  Any grab bars or handrails.  First and last steps.  Where the edge of each step is.  Use tools that help you move around (mobility aids) if they are needed. These include:  Canes.  Walkers.  Scooters.  Crutches.  Turn on the lights when you go into a dark area. Replace any light bulbs as soon as they burn out.  Set up your furniture so you have a clear path. Avoid moving your furniture around.  If any of your floors are uneven, fix them.  If there are any pets around you, be aware of where they are.  Review your medicines with your doctor. Some medicines can make you feel dizzy. This can increase your chance of falling. Ask your doctor what other things that you can do to help prevent falls. This information is not intended to replace advice given to you by your health care provider. Make sure you discuss any questions you have with your health care provider. Document Released: 03/22/2009 Document Revised: 11/01/2015 Document Reviewed: 06/30/2014 Elsevier Interactive Patient Education  2017 [employer].

## 2019-01-24 NOTE — Progress Notes (Signed)
PCP notes:  Health Maintenance:  No gaps  Abnormal Screenings:  None  Patient concerns:  None  Nurse concerns:  None  Next PCP appt.: 01/31/2019 at 11:30

## 2019-01-24 NOTE — Progress Notes (Signed)
Subjective:   Randall Ali. is a 78 y.o. male who presents for Medicare Annual/Subsequent preventive examination.  This visit type was conducted due to national recommendations for restrictions regarding the COVID-19 Pandemic (e.g. social distancing). This format is felt to be most appropriate for this patient at this time. All issues noted in this document were discussed and addressed. No physical exam was performed (except for noted visual exam findings with Video Visits). This patient, Randall Ali., has given permission to perform this visit via telephone. Vital signs may be absent or patient reported.  Patient location:  At home  Nurse location:  At home     Review of Systems:  n/a Cardiac Risk Factors include: advanced age (>3men, >76 women);hypertension;male gender     Objective:    Vitals: Ht 5\' 8"  (1.727 m) Comment: per patient  Wt 208 lb (94.3 kg) Comment: per patient  BMI 31.63 kg/m   Body mass index is 31.63 kg/m.  Advanced Directives 01/24/2019 01/04/2019 08/24/2017 06/10/2017 07/14/2016 04/24/2016 04/17/2016  Does Patient Have a Medical Advance Directive? Yes Yes No No Yes No;Yes Yes  Type of Paramedic of [place];Living will [place];Living will - - [place];Living will [place];Living will [place];Living will  Does patient want to make changes to medical advance directive? - No - Patient declined - - - - -  Copy of [place] in Chart? No - copy requested No - copy requested - - No - copy requested No - copy requested No - copy requested  Would patient like information on creating a medical advance directive? - - No - Patient declined No - Patient declined - - -  Pre-existing out of facility DNR order (yellow form or pink MOST form) - - - - - - -    Tobacco Social History   Tobacco Use  Smoking Status Former Smoker  . Packs/day:  2.50  . Years: 40.00  . Pack years: 100.00  . Types: Cigarettes  . Quit date: 03/24/2000  . Years since quitting: 18.8  Smokeless Tobacco Never Used     Counseling given: Not Answered   Clinical Intake:  Pre-visit preparation completed: Yes  Pain : No/denies pain     Nutritional Status: BMI > 30  Obese Nutritional Risks: None Diabetes: No  How often do you need to have someone help you when you read instructions, pamphlets, or other written materials from your doctor or pharmacy?: 1 - Never What is the last grade level you completed in school?: 12th grade  Interpreter Needed?: No  Information entered by :: NAllen LPN  Past Medical History:  Diagnosis Date  . Allergy    seasonal  . Arthritis    all over- in general   . CAD (coronary artery disease)    a. inferior wall MI 10/01 s/p PCI/DES to RCA; b. Myoview 3/16 neg for ischemia; c. LHC 8/16: ostLAD 80%, OM1 70%, OM2 70% x 2 lesions, mRCA 30%, dRCA 70% s/p 4-V CABG 01/24/15 (LIMA-LAD, VG- OM1, VG-OM2, VG-PDA)   . Cancer (HCC)    skin, melanoma  . Carotid artery disease ([place])    a. [place] 8/16: 1-39% bilateral ICA stenosis  . Cataract    removed  . Diastolic dysfunction    a. TTE 8/16: EF 55-60%, no RWMA, Gr1DD, calcified mitral annulus, mild biatrial enlargement  . Erectile dysfunction   . History of elbow surgery   .  HLD (hyperlipidemia)   . HTN (hypertension)   . Inferior myocardial infarction ([place]) 10/01   stent RCA  . Postoperative wound infection 02/02/2015  . Reflux esophagitis   . Sleep apnea 2017   CPAP at night   Past Surgical History:  Procedure Laterality Date  . arm surgery  2010  . CARDIAC CATHETERIZATION  06/24/11  . CARDIAC CATHETERIZATION N/A 01/18/2015   Procedure: Left Heart Cath with coronary angiography;  Surgeon: Minna Merritts, MD;  Location: [facility];  Service: Cardiovascular;  Laterality: N/A;  . CARDIAC CATHETERIZATION N/A 01/18/2015   Procedure: Intravascular Pressure  Wire/FFR Study;  Surgeon: [name], MD;  Location: [facility];  Service: Cardiovascular;  Laterality: N/A;  . CAROTID STENT  03/10/2011  . COLONOSCOPY  2010  . COLONOSCOPY  06/14/2014   Dr Hilarie Fredrickson  . CORONARY ARTERY BYPASS GRAFT N/A 01/24/2015   Procedure: CORONARY ARTERY BYPASS GRAFTING x 4 (LIMA-LAD, SVG-Int 1- Int 2, SVG-PD) ENDOSCOPIC GREATER SAPHENOUS VEIN HARVEST LEFT LEG;  Surgeon: [name], MD;  Location: [facility];  Service: Open Heart Surgery;  Laterality: N/A;  . ESOPHAGOGASTRODUODENOSCOPY (EGD) WITH PROPOFOL N/A 04/24/2016   Procedure: ESOPHAGOGASTRODUODENOSCOPY (EGD) WITH PROPOFOL;  Surgeon: Jerene Bears, MD;  Location: [facility];  Service: Gastroenterology;  Laterality: N/A;  . EYE SURGERY     lasik 15 yrs. ago, cataracts removed - both eyes   . HAMMER TOE SURGERY     right toe  . LEFT HEART CATH AND CORONARY ANGIOGRAPHY Left 06/10/2017   Procedure: LEFT HEART CATH AND CORONARY ANGIOGRAPHY;  Surgeon: Minna Merritts, MD;  Location: [facility];  Service: Cardiovascular;  Laterality: Left;  . LOWER EXTREMITY ANGIOGRAPHY Left 01/04/2019   Procedure: LOWER EXTREMITY ANGIOGRAPHY;  Surgeon: Katha Cabal, MD;  Location: [facility];  Service: Cardiovascular;  Laterality: Left;  . NASAL SINUS SURGERY  2008   septpolasty, bilateral turbinate reduction  . SHOULDER ARTHROSCOPY  2012  . TEE WITHOUT CARDIOVERSION N/A 01/24/2015   Procedure: TRANSESOPHAGEAL ECHOCARDIOGRAM (TEE);  Surgeon: [name], MD;  Location: [facility];  Service: Open Heart Surgery;  Laterality: N/A;  . [place]  . UPPER GI ENDOSCOPY  07/2014, 04-24-16   Dr Raquel James  . WRIST SURGERY  2011   Family History  Problem Relation Age of Onset  . Hypertension Mother   . Heart disease Mother   . Hypertension Father   . Diabetes Father   . Heart disease Brother 72  . Cancer Paternal Grandfather   . Colon cancer Neg Hx   . Prostate cancer Neg Hx   . Bladder  Cancer Neg Hx   . Kidney cancer Neg Hx    Social History   Socioeconomic History  . Marital status: Single    Spouse name: Not on file  . Number of children: Not on file  . Years of education: 55  . Highest education level: Not on file  Occupational History  . Occupation: retired    Comment: Agricultural consultant  Social Needs  . Financial resource strain: Not hard at all  . Food insecurity    Worry: Never true    Inability: Never true  . Transportation needs    Medical: No    Non-medical: No  Tobacco Use  . Smoking status: Former Smoker    Packs/day: 2.50    Years: 40.00    Pack years: 100.00    Types: Cigarettes    Quit  date: 03/24/2000    Years since quitting: 18.8  . Smokeless tobacco: Never Used  Substance and Sexual Activity  . Alcohol use: Yes    Alcohol/week: 5.0 standard drinks    Types: 5 Shots of liquor per week  . Drug use: No  . Sexual activity: Yes  Lifestyle  . Physical activity    Days per week: 5 days    Minutes per session: 60 min  . Stress: Not at all  Relationships  . Social Herbalist on phone: Not on file    Gets together: Not on file    Attends religious service: Not on file    Active member of club or organization: Not on file    Attends meetings of clubs or organizations: Not on file    Relationship status: Not on file  Other Topics Concern  . Not on file  Social History Narrative   Singled; lives with son and dog    Occ: retired, back part time at [employer];    Activity: gym 4-5d/wk   Diet: good water, fruits/vegetables daily   Caffeine Use-yes    Outpatient Encounter Medications as of 01/24/2019  Medication Sig  . aspirin 81 MG EC tablet Take 81 mg by mouth daily.    . Calcium-Magnesium-Vitamin D (CALCIUM 1200+D3 PO) Take 1 tablet by mouth daily.  . cholecalciferol (VITAMIN D) 1000 units tablet Take 1,000 Units by mouth daily.  . clopidogrel (PLAVIX) 75 MG tablet Take 1 tablet (75 mg total) by mouth daily. (Patient taking  differently: Take 75 mg by mouth at bedtime. )  . ibuprofen (ADVIL,MOTRIN) 200 MG tablet Take 400 mg by mouth every 6 (six) hours as needed for headache or moderate pain.   [name] lisinopril (ZESTRIL) 5 MG tablet TAKE 1 TABLET BY MOUTH EVERY DAY (Patient taking differently: Take 5 mg by mouth every evening. )  . metoprolol succinate (TOPROL-XL) 25 MG 24 hr tablet TAKE 1/2 TABLET BY MOUTH EVERY DAY (Patient taking differently: Take 12.5 mg by mouth every evening. )  . nitroGLYCERIN (NITROSTAT) 0.4 MG SL tablet Place 1 tablet (0.4 mg total) under the tongue every 5 (five) minutes as needed for chest pain.  . pantoprazole (PROTONIX) 40 MG tablet Take 1 tablet (40 mg total) by mouth daily.  Vladimir Faster Glycol-Propyl Glycol (LUBRICANT EYE DROPS) 0.4-0.3 % SOLN Place 1-2 drops into both eyes 3 (three) times daily as needed (burning eyes.).  [name] simvastatin (ZOCOR) 40 MG tablet TAKE 1 TABLET(40 MG) BY MOUTH AT BEDTIME (Patient taking differently: Take 40 mg by mouth at bedtime. )  . tamsulosin (FLOMAX) 0.4 MG CAPS capsule TAKE 1 CAPSULE BY MOUTH EVERY DAY AFTER SUPPER (Patient taking differently: Take 0.4 mg by mouth daily after supper. )  . traMADol (ULTRAM) 50 MG tablet TAKE 1 TABLET BY MOUTH THREE TIMES DAILY (Patient taking differently: Take 50 mg by mouth 3 (three) times daily as needed. )  . vitamin B-12 (CYANOCOBALAMIN) 1000 MCG tablet Take 1,000 mcg by mouth daily.  . vitamin C (ASCORBIC ACID) 500 MG tablet Take 500 mg by mouth daily.  . vitamin E 400 UNIT capsule Take 400 Units by mouth daily.  . Zinc 100 MG TABS Take 100 mg by mouth daily.   . hydrOXYzine (ATARAX/VISTARIL) 25 MG tablet Take 0.5-1 tablets (12.5-25 mg total) by mouth 2 (two) times daily as needed for itching. (Patient not taking: Reported on 01/19/2019)   No facility-administered encounter medications on file as of 01/24/2019.  Activities of Daily Living In your present state of health, do you have any difficulty performing the  following activities: 01/24/2019 01/04/2019  Hearing? Y N  Comment wears hearing aide -  Vision? N N  Difficulty concentrating or making decisions? N N  Walking or climbing stairs? N N  Dressing or bathing? N N  Doing errands, shopping? N -  Preparing Food and eating ? N -  Using the Toilet? N -  In the past six months, have you accidently leaked urine? N -  Do you have problems with loss of bowel control? N -  Managing your Medications? N -  Managing your Finances? N -  Housekeeping or managing your Housekeeping? N -  Some recent data might be hidden    Patient Care Team: Ria Bush, MD as PCP - General (Family Medicine) [name], MD (Internal Medicine) Minna Merritts, MD as Consulting Physician (Cardiology)   Assessment:   This is a routine wellness examination for [place].  Exercise Activities and Dietary recommendations Current Exercise Habits: Home exercise routine, Time (Minutes): 60, Frequency (Times/Week): 5, Weekly Exercise (Minutes/Week): 300  Goals    . Increase physical activity     Starting 07/14/16, I will continue to exercise at least 60 min 4 days per week.     . Increase physical activity     Starting 08/24/2017, I will continue to exercise at gym for 60 minutes 3-4 days per week.     . Patient Stated     01/24/2019, stay healthy       Fall Risk Fall Risk  01/24/2019 04/20/2018 08/24/2017 07/14/2016 02/06/2016  Falls in the past year? 0 0 No No No  Risk for fall due to : Medication side effect - - - -  Follow up Falls evaluation completed;Falls prevention discussed Falls evaluation completed - - -   Is the patient's home free of loose throw rugs in walkways, pet beds, electrical cords, etc?   yes      Grab bars in the bathroom? no      Handrails on the stairs?  n/a      Adequate lighting?   yes  Timed Get Up and Go Performed: n/a  Depression Screen PHQ 2/9 Scores 01/24/2019 08/24/2017 07/14/2016 02/06/2016  PHQ - 2 Score 0 0 0 0  PHQ- 9 Score  0 0 - -    Cognitive Function MMSE - Mini Mental State Exam 01/24/2019 08/24/2017 07/14/2016  Orientation to time 5 5 5   Orientation to Place 5 5 5   Registration 3 3 3   Attention/ Calculation 2 0 0  Attention/Calculation-comments did not get r,o,w - -  Recall 3 2 2   Recall-comments - unable to recall 1 of 3 words pt was unable to recall 1 of 3 words  Language- name 2 objects 0 0 0  Language- repeat 1 1 1   Language- follow 3 step command 0 3 3  Language- read & follow direction 0 0 0  Write a sentence 0 0 0  Copy design 0 0 0  Total score 19 19 19    Mini Cog  Mini-Cog screen was completed. Maximum score is 22. A value of 0 denotes this part of the MMSE was not completed or the patient failed this part of the Mini-Cog screening.       Immunization History  Administered Date(s) Administered  . Influenza Split 02/27/2012, 04/10/2014  . Influenza, High Dose Seasonal PF 04/05/2013, 02/08/2016  . Influenza,inj,Quad PF,6+ Mos 03/22/2015, 02/25/2017, 03/11/2018  .  Pneumococcal Conjugate-13 04/13/2015  . Pneumococcal Polysaccharide-23 02/26/2005, 02/27/2012  . Tdap 04/18/2015  . Zoster 04/06/2011  . Zoster Recombinat (Shingrix) 09/23/2017, 02/12/2018    Qualifies for Shingles Vaccine? yes  Screening Tests Health Maintenance  Topic Date Due  . INFLUENZA VACCINE  01/08/2019  . DTaP/Tdap/Td (2 - Td) 04/17/2025  . TETANUS/TDAP  04/17/2025  . PNA vac Low Risk Adult  Completed   Cancer Screenings: Lung: Low Dose CT Chest recommended if Age 6-80 years, 30 pack-year currently smoking OR have quit w/in 15years. Patient does not qualify. Colorectal: not required  Additional Screenings:  Hepatitis C Screening:n/a      Plan:    Patient goal is to stay healthy.  I have personally reviewed and noted the following in the patient's chart:   . Medical and social history . Use of alcohol, tobacco or illicit drugs  . Current medications and supplements . Functional ability and status  . Nutritional status . Physical activity . Advanced directives . List of other physicians . Hospitalizations, surgeries, and ER visits in previous 12 months . Vitals . Screenings to include cognitive, depression, and falls . Referrals and appointments  In addition, I have reviewed and discussed with patient certain preventive protocols, quality metrics, and best practice recommendations. A written personalized care plan for preventive services as well as general preventive health recommendations were provided to patient.     Kellie Simmering, LPN  9/37/[id number]

## 2019-01-25 ENCOUNTER — Encounter: Payer: Self-pay | Admitting: *Deleted

## 2019-01-25 ENCOUNTER — Ambulatory Visit
Admission: RE | Admit: 2019-01-25 | Discharge: 2019-01-25 | Disposition: A | Discharge status: Home / Self Care | Payer: PPO | Attending: Vascular Surgery | Admitting: Vascular Surgery

## 2019-01-25 ENCOUNTER — Other Ambulatory Visit (INDEPENDENT_AMBULATORY_CARE_PROVIDER_SITE_OTHER): Payer: Self-pay | Admitting: Nurse Practitioner

## 2019-01-25 ENCOUNTER — Other Ambulatory Visit (INDEPENDENT_AMBULATORY_CARE_PROVIDER_SITE_OTHER): Payer: Self-pay | Admitting: Vascular Surgery

## 2019-01-25 ENCOUNTER — Encounter
Admission: RE | Disposition: A | Discharge status: Home / Self Care | Payer: Self-pay | Source: Home / Self Care | Attending: Vascular Surgery

## 2019-01-25 DIAGNOSIS — N529 Male erectile dysfunction, unspecified: Secondary | ICD-10-CM | POA: Insufficient documentation

## 2019-01-25 DIAGNOSIS — I5030 Unspecified diastolic (congestive) heart failure: Secondary | ICD-10-CM | POA: Diagnosis not present

## 2019-01-25 DIAGNOSIS — Z79899 Other long term (current) drug therapy: Secondary | ICD-10-CM | POA: Diagnosis not present

## 2019-01-25 DIAGNOSIS — I110 Hypertensive heart disease with heart failure: Secondary | ICD-10-CM | POA: Insufficient documentation

## 2019-01-25 DIAGNOSIS — I739 Peripheral vascular disease, unspecified: Secondary | ICD-10-CM

## 2019-01-25 DIAGNOSIS — I70212 Atherosclerosis of native arteries of extremities with intermittent claudication, left leg: Secondary | ICD-10-CM | POA: Diagnosis not present

## 2019-01-25 DIAGNOSIS — G4730 Sleep apnea, unspecified: Secondary | ICD-10-CM | POA: Insufficient documentation

## 2019-01-25 DIAGNOSIS — Z7982 Long term (current) use of aspirin: Secondary | ICD-10-CM | POA: Diagnosis not present

## 2019-01-25 DIAGNOSIS — I70211 Atherosclerosis of native arteries of extremities with intermittent claudication, right leg: Secondary | ICD-10-CM | POA: Insufficient documentation

## 2019-01-25 DIAGNOSIS — Z95828 Presence of other vascular implants and grafts: Secondary | ICD-10-CM | POA: Insufficient documentation

## 2019-01-25 DIAGNOSIS — Z833 Family history of diabetes mellitus: Secondary | ICD-10-CM | POA: Insufficient documentation

## 2019-01-25 DIAGNOSIS — I2510 Atherosclerotic heart disease of native coronary artery without angina pectoris: Secondary | ICD-10-CM | POA: Insufficient documentation

## 2019-01-25 DIAGNOSIS — Z87891 Personal history of nicotine dependence: Secondary | ICD-10-CM | POA: Insufficient documentation

## 2019-01-25 DIAGNOSIS — I252 Old myocardial infarction: Secondary | ICD-10-CM | POA: Diagnosis not present

## 2019-01-25 DIAGNOSIS — I6523 Occlusion and stenosis of bilateral carotid arteries: Secondary | ICD-10-CM | POA: Diagnosis not present

## 2019-01-25 DIAGNOSIS — M1990 Unspecified osteoarthritis, unspecified site: Secondary | ICD-10-CM | POA: Diagnosis not present

## 2019-01-25 DIAGNOSIS — Z951 Presence of aortocoronary bypass graft: Secondary | ICD-10-CM | POA: Diagnosis not present

## 2019-01-25 DIAGNOSIS — E782 Mixed hyperlipidemia: Secondary | ICD-10-CM | POA: Diagnosis not present

## 2019-01-25 DIAGNOSIS — Z7902 Long term (current) use of antithrombotics/antiplatelets: Secondary | ICD-10-CM | POA: Diagnosis not present

## 2019-01-25 DIAGNOSIS — Z8249 Family history of ischemic heart disease and other diseases of the circulatory system: Secondary | ICD-10-CM | POA: Insufficient documentation

## 2019-01-25 HISTORY — PX: LOWER EXTREMITY ANGIOGRAPHY: CATH118251

## 2019-01-25 LAB — CREATININE, SERUM
Creatinine, Ser: 1.03 mg/dL (ref 0.61–1.24)
GFR calc Af Amer: >60 mL/min (ref >60)
GFR calc non Af Amer: >60 mL/min (ref >60)

## 2019-01-25 LAB — BUN: BUN: 25 mg/dL — ABNORMAL HIGH (ref 8–23)

## 2019-01-25 SURGERY — LOWER EXTREMITY ANGIOGRAPHY
Anesthesia: Moderate Sedation | Laterality: Right

## 2019-01-25 MED ORDER — SODIUM CHLORIDE 0.9% FLUSH: 3.0000 mL | Freq: Two times a day (BID) | INTRAVENOUS | Status: DC

## 2019-01-25 MED ORDER — LABETALOL HCL 5 MG/ML IV SOLN: 10.0000 mg | INTRAVENOUS | Status: DC | PRN

## 2019-01-25 MED ORDER — MIDAZOLAM HCL 2 MG/ML PO SYRP: 8.0000 mg | ORAL_SOLUTION | Freq: Once | ORAL | Status: DC | PRN

## 2019-01-25 MED ORDER — FAMOTIDINE 20 MG PO TABS: 40.0000 mg | ORAL_TABLET | Freq: Once | ORAL | Status: DC | PRN

## 2019-01-25 MED ORDER — MORPHINE SULFATE (PF) 4 MG/ML IV SOLN: 2.0000 mg | INTRAVENOUS | Status: DC | PRN

## 2019-01-25 MED ORDER — SODIUM CHLORIDE 0.9 % IV SOLN
INTRAVENOUS | Status: DC
  Administered 2019-01-25: 11:00:00 via INTRAVENOUS

## 2019-01-25 MED ORDER — MIDAZOLAM HCL 2 MG/2ML IJ SOLN
INTRAMUSCULAR | Status: DC | PRN
  Administered 2019-01-25 (×4): 1 mg via INTRAVENOUS

## 2019-01-25 MED ORDER — HEPARIN SODIUM (PORCINE) 1000 UNIT/ML IJ SOLN
INTRAMUSCULAR | Status: AC
  Filled 2019-01-25: qty 1

## 2019-01-25 MED ORDER — MIDAZOLAM HCL 5 MG/5ML IJ SOLN
INTRAMUSCULAR | Status: AC
  Filled 2019-01-25: qty 5

## 2019-01-25 MED ORDER — HYDRALAZINE HCL 20 MG/ML IJ SOLN: 5.0000 mg | INTRAMUSCULAR | Status: DC | PRN

## 2019-01-25 MED ORDER — SODIUM CHLORIDE 0.9 % IV SOLN: 250.0000 mL | INTRAVENOUS | Status: DC | PRN

## 2019-01-25 MED ORDER — FENTANYL CITRATE (PF) 100 MCG/2ML IJ SOLN
INTRAMUSCULAR | Status: DC | PRN
  Administered 2019-01-25 (×4): 50 ug via INTRAVENOUS

## 2019-01-25 MED ORDER — METHYLPREDNISOLONE SODIUM SUCC 125 MG IJ SOLR: 125.0000 mg | Freq: Once | INTRAMUSCULAR | Status: DC | PRN

## 2019-01-25 MED ORDER — ONDANSETRON HCL 4 MG/2ML IJ SOLN: 4.0000 mg | Freq: Four times a day (QID) | INTRAMUSCULAR | Status: DC | PRN

## 2019-01-25 MED ORDER — ACETAMINOPHEN 325 MG PO TABS: 650.0000 mg | ORAL_TABLET | ORAL | Status: DC | PRN

## 2019-01-25 MED ORDER — CEFAZOLIN SODIUM-DEXTROSE 2-4 GM/100ML-% IV SOLN
2.0000 g | Freq: Once | INTRAVENOUS | Status: AC
  Administered 2019-01-25: 2 g via INTRAVENOUS

## 2019-01-25 MED ORDER — FENTANYL CITRATE (PF) 100 MCG/2ML IJ SOLN
INTRAMUSCULAR | Status: AC
  Filled 2019-01-25: qty 4

## 2019-01-25 MED ORDER — SODIUM CHLORIDE 0.9% FLUSH: 3.0000 mL | INTRAVENOUS | Status: DC | PRN

## 2019-01-25 MED ORDER — SODIUM CHLORIDE 0.9 % IV SOLN: INTRAVENOUS | Status: DC

## 2019-01-25 MED ORDER — DIPHENHYDRAMINE HCL 50 MG/ML IJ SOLN: 50.0000 mg | Freq: Once | INTRAMUSCULAR | Status: DC | PRN

## 2019-01-25 MED ORDER — OXYCODONE HCL 5 MG PO TABS: 5.0000 mg | ORAL_TABLET | ORAL | Status: DC | PRN

## 2019-01-25 MED ORDER — HYDROMORPHONE HCL 1 MG/ML IJ SOLN: 1.0000 mg | Freq: Once | INTRAMUSCULAR | Status: DC | PRN

## 2019-01-25 SURGICAL SUPPLY — 14 items
CATH BEACON 5 .035 65 RIM TIP (CATHETERS) ×1 IMPLANT
CATH PIG 70CM (CATHETERS) ×1 IMPLANT
CATH VERT 5FR 125CM (CATHETERS) ×1 IMPLANT
DEVICE STARCLOSE SE CLOSURE (Vascular Products) ×1 IMPLANT
DEVICE TORQUE .025-.038 (MISCELLANEOUS) ×1 IMPLANT
NDL ENTRY 21GA 7CM ECHOTIP (NEEDLE) IMPLANT
NEEDLE ENTRY 21GA 7CM ECHOTIP (NEEDLE) ×2 IMPLANT
PACK ANGIOGRAPHY (CUSTOM PROCEDURE TRAY) ×2 IMPLANT
SET INTRO CAPELLA COAXIAL (SET/KITS/TRAYS/PACK) ×1 IMPLANT
SHEATH BRITE TIP 5FRX11 (SHEATH) ×1 IMPLANT
SYR MEDRAD MARK V 150ML (SYRINGE) ×1 IMPLANT
TUBING CONTRAST HIGH PRESS 72 (TUBING) ×1 IMPLANT
WIRE AQUATRAK .035X260 ANG (WIRE) ×1 IMPLANT
WIRE J 3MM .035X145CM (WIRE) ×1 IMPLANT

## 2019-01-25 NOTE — Progress Notes (Signed)
Dr. Delana Meyer at bedside now speaking with pt. Re: procedural results. Pt. Verbalizes understanding. Of conversation. Left groin clean, dry, intact without any complications at site. Pt. Stable for DC home.

## 2019-01-25 NOTE — Op Note (Signed)
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  Right lower extremity angiography third order catheter placement  2.  Ultrasound-guided access left common femoral artery  3.  Star close left common femoral artery    Anesthesia: Conscious sedation was administered by the interventional radiology RN under my direct supervision. IV Versed plus fentanyl were utilized. Continuous ECG, pulse oximetry and blood pressure was monitored throughout the entire procedure.  Conscious sedation was administered for a total of 45 minutes.  Sheath: 5 French 11 cm Pinnacle sheath left common femoral retrograde  Contrast: 70 cc   Fluoroscopy Time: 6.5 minutes  Indications:  The patient presents to [facility] with worsening pain in his right lower extremity with ambulation.  Pedal pulses are nonpalpable bilaterally suggesting atherosclerotic occlusive disease.  The risks and benefits as well as alternative therapies for lower extremity revascularization are reviewed with the patient all questions are answered the patient agrees to proceed.  The patient is therefore undergoing angiography with the hope for intervention for limb salvage.   Procedure:  Randall Aliis a 78 y.o. male who was identified and appropriate procedural time out was performed.  The patient was then placed supine on the table and prepped and draped in the usual sterile fashion.  Ultrasound was used to evaluate the left common femoral artery.  It was echolucent and pulsatile indicating it is patent .  An ultrasound image was acquired for the permanent record.  A micropuncture needle was used to access the left common  femoral artery under direct ultrasound guidance.  The microwire was then advanced under fluoroscopic guidance without difficulty followed by the micro-sheath.  A 0.035 J wire was advanced without resistance and a 5Fr sheath was placed.    Pigtail catheter was then advanced to the level of T12 and AP projection of the aorta was obtained. Pigtail catheter was then repositioned to above the bifurcation and LAO view of the pelvis was obtained. Stiff angled Glidewire and rim catheter was then used across the bifurcation and the catheter was positioned in the distal external iliac artery.  RAO of the right groin was then obtained. Wire was reintroduced and negotiated into the SFA and the catheter was advanced into the SFA. Distal runoff was then performed.  After review of the images the catheter was removed over wire and an LAO view of the groin was obtained. StarClose device was deployed without difficulty.   Findings:   Aortogram: Aorta was previously imaged on the last study there were no significant changes.  Right Lower Extremity: The previously placed common iliac stents appear patent without evidence of hemodynamically significant stenosis.  The right distal common iliac and external iliac artery are free of hemodynamically significant stenoses.  The right common femoral demonstrates a greater than 80% stenosis distally there is bulky eccentric plaque noted throughout the common femoral.  The origin of the profunda femoris and the SFA both demonstrate greater than 80% stenosis which is continuation of the common femoral lesion.  The SFA demonstrates multiple lesions in the midportion there is a greater than 70% stenosis at Hunter's canal there is a greater than 70% stenosis.  The popliteal artery at its midportion behind the knee demonstrates a 90% string sign.  Trifurcation is diseased  with occlusion of the anterior tibial throughout its entire course.  Tibioperoneal trunk is patent and free of  hemodynamically significant stenosis and the posterior tibial is patent throughout its entire course filling the lateral medial plantar vessels and the pedal arch.  It is the dominant runoff to the foot.  There is delayed filling of the a small peroneal which extends collaterals reconstituting the dorsalis pedis but the dorsalis pedis does not appear to contribute significantly to the pedal arch.  Summary: Given the patient's symptoms he would clearly benefit from a common femoral endarterectomy extending into the profunda femoris and the SFA.  I believe his more distal SFA lesions and in particular the popliteal lesion can be treated with intervention and stenting at that time.  Surgical option would be a femoral to distal bypass but given that he does not have tissue loss I would support option #1 over the bypass at this point in time.    Disposition: Patient was taken to the recovery room in stable condition having tolerated the procedure well.  Belenda Cruise  01/25/2019,2:08 PM

## 2019-01-25 NOTE — Discharge Instructions (Signed)

## 2019-01-26 ENCOUNTER — Encounter: Payer: Self-pay | Admitting: Vascular Surgery

## 2019-01-27 ENCOUNTER — Other Ambulatory Visit: Payer: Self-pay | Admitting: Family Medicine

## 2019-01-27 ENCOUNTER — Other Ambulatory Visit: Payer: Self-pay | Admitting: Cardiovascular Disease

## 2019-01-28 DIAGNOSIS — G4733 Obstructive sleep apnea (adult) (pediatric): Secondary | ICD-10-CM | POA: Diagnosis not present

## 2019-01-28 NOTE — Telephone Encounter (Signed)
LOV 01/18/2019, future appointment on 03/17/2019. Last filled on 01/04/2019 for #90 with 0 refill.

## 2019-01-30 NOTE — Telephone Encounter (Signed)
Eprescribed.

## 2019-01-31 ENCOUNTER — Encounter (INDEPENDENT_AMBULATORY_CARE_PROVIDER_SITE_OTHER): Payer: Self-pay | Admitting: Vascular Surgery

## 2019-01-31 ENCOUNTER — Ambulatory Visit (INDEPENDENT_AMBULATORY_CARE_PROVIDER_SITE_OTHER): Payer: PPO | Admitting: Vascular Surgery

## 2019-01-31 ENCOUNTER — Encounter: Payer: PPO | Admitting: Family Medicine

## 2019-01-31 ENCOUNTER — Other Ambulatory Visit: Payer: Self-pay

## 2019-01-31 VITALS — BP 118/62 | HR 68 | Resp 14 | Ht 68.0 in | Wt 212.0 lb

## 2019-01-31 DIAGNOSIS — I70213 Atherosclerosis of native arteries of extremities with intermittent claudication, bilateral legs: Secondary | ICD-10-CM | POA: Diagnosis not present

## 2019-01-31 DIAGNOSIS — I480 Paroxysmal atrial fibrillation: Secondary | ICD-10-CM

## 2019-01-31 DIAGNOSIS — I25118 Atherosclerotic heart disease of native coronary artery with other forms of angina pectoris: Secondary | ICD-10-CM

## 2019-01-31 DIAGNOSIS — I10 Essential (primary) hypertension: Secondary | ICD-10-CM

## 2019-01-31 DIAGNOSIS — E782 Mixed hyperlipidemia: Secondary | ICD-10-CM

## 2019-01-31 NOTE — Progress Notes (Signed)
MRN : [id number]  Randall Ali. is a 79 y.o. (1941/04/01) male who presents with chief complaint of  Chief Complaint  Patient presents with   Follow-up  .  History of Present Illness:   The patient returns to the office for followup and review status post angiogram without intervention 01/25/2019. The patient notes continued pain in the right lower extremity symptoms. Patient reports mild rest pain symptoms. No new ulcers or wounds have occurred since the last visit.  There have been no significant changes to the patient's overall health care.  The patient denies amaurosis fugax or recent TIA symptoms. There are no recent neurological changes noted. The patient denies history of DVT, PE or superficial thrombophlebitis. The patient denies recent episodes of angina or shortness of breath.   Current Meds  Medication Sig   aspirin 81 MG EC tablet Take 81 mg by mouth daily.     Calcium-Magnesium-Vitamin D (CALCIUM 1200+D3 PO) Take 1 tablet by mouth daily.   cholecalciferol (VITAMIN D) 1000 units tablet Take 1,000 Units by mouth daily.   clopidogrel (PLAVIX) 75 MG tablet Take 1 tablet (75 mg total) by mouth daily. (Patient taking differently: Take 75 mg by mouth at bedtime. )   ibuprofen (ADVIL,MOTRIN) 200 MG tablet Take 400 mg by mouth every 6 (six) hours as needed for headache or moderate pain.    lisinopril (ZESTRIL) 5 MG tablet TAKE 1 TABLET BY MOUTH EVERY DAY (Patient taking differently: Take 5 mg by mouth every evening. )   metoprolol succinate (TOPROL-XL) 25 MG 24 hr tablet TAKE 1/2 TABLET BY MOUTH EVERY DAY (Patient taking differently: Take 12.5 mg by mouth every evening. )   nitroGLYCERIN (NITROSTAT) 0.4 MG SL tablet Place 1 tablet (0.4 mg total) under the tongue every 5 (five) minutes as needed for chest pain.   NONFORMULARY OR COMPOUNDED ITEM Trimix (30/1/10)-(Pap/Phent/PGE)  Test Dose 3 44ml vials  Qty #3 Refills 0  [facility] [phone] Fax  [phone]   pantoprazole (PROTONIX) 40 MG tablet Take 1 tablet (40 mg total) by mouth daily.   Polyethyl Glycol-Propyl Glycol (LUBRICANT EYE DROPS) 0.4-0.3 % SOLN Place 1-2 drops into both eyes 3 (three) times daily as needed (burning eyes.).   simvastatin (ZOCOR) 40 MG tablet Take 1 tablet (40 mg total) by mouth at bedtime.   tamsulosin (FLOMAX) 0.4 MG CAPS capsule TAKE 1 CAPSULE BY MOUTH EVERY DAY AFTER SUPPER (Patient taking differently: Take 0.4 mg by mouth daily after supper. )   traMADol (ULTRAM) 50 MG tablet TAKE 1 TABLET BY MOUTH THREE TIMES DAILY   vitamin B-12 (CYANOCOBALAMIN) 1000 MCG tablet Take 1,000 mcg by mouth daily.   vitamin C (ASCORBIC ACID) 500 MG tablet Take 500 mg by mouth daily.   vitamin E 400 UNIT capsule Take 400 Units by mouth daily.   Zinc 100 MG TABS Take 100 mg by mouth daily.     Past Medical History:  Diagnosis Date   Allergy    seasonal   Arthritis    all over- in general    CAD (coronary artery disease)    a. inferior wall MI 10/01 s/p PCI/DES to RCA; b. Myoview 3/16 neg for ischemia; c. LHC 8/16: ostLAD 80%, OM1 70%, OM2 70% x 2 lesions, mRCA 30%, dRCA 70% s/p 4-V CABG 01/24/15 (LIMA-LAD, VG- OM1, VG-OM2, VG-PDA)    Cancer (HCC)    skin, melanoma   Carotid artery disease ([place])    a. [place] 8/16: 1-39% bilateral ICA  stenosis   Cataract    removed   Diastolic dysfunction    a. 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   stent RCA   Postoperative wound infection 02/02/2015   Reflux esophagitis    Sleep apnea 2017   CPAP at night    Past Surgical History:  Procedure Laterality Date   arm surgery  2010   CARDIAC CATHETERIZATION  06/24/11   CARDIAC CATHETERIZATION N/A 01/18/2015   Procedure: Left Heart Cath with coronary angiography;   Surgeon: Minna Merritts, MD;  Location: [facility];  Service: Cardiovascular;  Laterality: N/A;   CARDIAC CATHETERIZATION N/A 01/18/2015   Procedure: Intravascular Pressure Wire/FFR Study;  Surgeon: [name], MD;  Location: [facility];  Service: Cardiovascular;  Laterality: N/A;   CAROTID STENT  03/10/2011   COLONOSCOPY  2010   COLONOSCOPY  06/14/2014   Dr Hilarie Fredrickson   CORONARY ARTERY BYPASS GRAFT N/A 01/24/2015   Procedure: CORONARY ARTERY BYPASS GRAFTING x 4 (LIMA-LAD, SVG-Int 1- Int 2, SVG-PD) ENDOSCOPIC GREATER SAPHENOUS VEIN HARVEST LEFT LEG;  Surgeon: [name], MD;  Location: [facility];  Service: Open Heart Surgery;  Laterality: N/A;   ESOPHAGOGASTRODUODENOSCOPY (EGD) WITH PROPOFOL N/A 04/24/2016   Procedure: ESOPHAGOGASTRODUODENOSCOPY (EGD) WITH PROPOFOL;  Surgeon: Jerene Bears, MD;  Location: [facility];  Service: Gastroenterology;  Laterality: N/A;   EYE SURGERY     lasik 15 yrs. ago, cataracts removed - both eyes    HAMMER TOE SURGERY     right toe   LEFT HEART CATH AND CORONARY ANGIOGRAPHY Left 06/10/2017   Procedure: LEFT HEART CATH AND CORONARY ANGIOGRAPHY;  Surgeon: Minna Merritts, MD;  Location: [facility];  Service: Cardiovascular;  Laterality: Left;   LOWER EXTREMITY ANGIOGRAPHY Left 01/04/2019   Procedure: LOWER EXTREMITY ANGIOGRAPHY;  Surgeon: Katha Cabal, MD;  Location: [facility];  Service: Cardiovascular;  Laterality: Left;   LOWER EXTREMITY ANGIOGRAPHY Right 01/25/2019   Procedure: LOWER EXTREMITY ANGIOGRAPHY;  Surgeon: Katha Cabal, MD;  Location: [facility];  Service: Cardiovascular;  Laterality: Right;   NASAL SINUS SURGERY  2008   septpolasty, bilateral turbinate reduction   SHOULDER ARTHROSCOPY  2012   TEE WITHOUT CARDIOVERSION N/A 01/24/2015   Procedure: TRANSESOPHAGEAL ECHOCARDIOGRAM (TEE);  Surgeon: [name], MD;  Location: [facility];  Service: Open Heart Surgery;   Laterality: N/A;   TOE SURGERY  1994   UPPER GI ENDOSCOPY  07/2014, 04-24-16   Dr Raquel James   WRIST SURGERY  2011    Social History Social History   Tobacco Use   Smoking status: Former Smoker    Packs/day: 2.50    Years: 40.00    Pack years: 100.00    Types: Cigarettes    Quit date: 03/24/2000    Years since quitting: 18.8   Smokeless tobacco: Never Used  Substance Use Topics   Alcohol use: Yes    Alcohol/week: 5.0 standard drinks    Types: 5 Shots of liquor per week   Drug use: No    Family History Family History  Problem Relation Age of Onset   Hypertension Mother    Heart disease Mother    Hypertension Father    Diabetes Father    Heart disease Brother 26  Cancer Paternal Grandfather    Colon cancer Neg Hx    Prostate cancer Neg Hx    Bladder Cancer Neg Hx    Kidney cancer Neg Hx     No Known Allergies   REVIEW OF SYSTEMS (Negative unless checked)  Constitutional: [] Weight loss  [] Fever  [] Chills Cardiac: [x] Chest pain   [] Chest pressure   [x] Palpitations   [] Shortness of breath when laying flat   [] Shortness of breath with exertion. Vascular:  [x] Pain in legs with walking   [] Pain in legs at rest  [] History of DVT   [] Phlebitis   [] Swelling in legs   [] Varicose veins   [] Non-healing ulcers Pulmonary:   [] Uses home oxygen   [] Productive cough   [] Hemoptysis   [] Wheeze  [] COPD   [] Asthma Neurologic:  [] Dizziness   [] Seizures   [] History of stroke   [] History of TIA  [] Aphasia   [] Vissual changes   [] Weakness or numbness in arm   [] Weakness or numbness in leg Musculoskeletal:   [] Joint swelling   [x] Joint pain   [] Low back pain Hematologic:  [] Easy bruising  [] Easy bleeding   [] Hypercoagulable state   [] Anemic Gastrointestinal:  [] Diarrhea   [] Vomiting  [] Gastroesophageal reflux/heartburn   [] Difficulty swallowing. Genitourinary:  [] Chronic kidney disease   [] Difficult urination  [] Frequent urination   [] Blood in urine Skin:  [] Rashes   [] Ulcers   Psychological:  [] History of anxiety   []  History of major depression.  Physical Examination  Vitals:   01/31/19 1120  BP: 118/62  Pulse: 68  Resp: 14  Weight: 212 lb (96.2 kg)  Height: 5\' 8"  (1.727 m)   Body mass index is 32.23 kg/m. Gen: WD/WN, NAD Head: [place]/AT, No temporalis wasting.  Ear/Nose/Throat: Hearing grossly intact, nares w/o erythema or drainage Eyes: PER, EOMI, sclera nonicteric.  Neck: Supple, no large masses.   Pulmonary:  Good air movement, no audible wheezing bilaterally, no use of accessory muscles.  Cardiac: RRR, no JVD Vascular:  Feet pink right with sluggis cap refill right colder than the left foot Vessel Right Left  Radial Palpable Palpable  PT Not Palpable Not Palpable  DP Not Palpable Race Palpable  Gastrointestinal: Non-distended. No guarding/no peritoneal signs.  Musculoskeletal: M/S 5/5 throughout.  No deformity or atrophy.  Neurologic: CN 2-12 intact. Symmetrical.  Speech is fluent. Motor exam as listed above. Psychiatric: Judgment intact, Mood & affect appropriate for pt's clinical situation. Dermatologic: No rashes or ulcers noted.  No changes consistent with cellulitis. Lymph : No lichenification or skin changes of chronic lymphedema.  CBC Lab Results  Component Value Date   WBC 8.1 01/06/2019   HGB 14.7 01/06/2019   HCT 43.5 01/06/2019   MCV 92.3 01/06/2019   PLT 170.0 01/06/2019    BMET    Component Value Date/Time   NA 140 01/06/2019 0825   NA 138 01/15/2015 0956   K 4.8 01/06/2019 0825   CL 106 01/06/2019 0825   CO2 28 01/06/2019 0825   GLUCOSE 90 01/06/2019 0825   BUN 25 (H) 01/25/2019 1056   BUN 16 01/15/2015 0956   CREATININE 1.03 01/25/2019 1056   CALCIUM 9.2 01/06/2019 0825   GFRNONAA >60 01/25/2019 1056   GFRAA >60 01/25/2019 1056   Estimated Creatinine Clearance: 68.6 mL/min (by C-G formula based on SCr of 1.03 mg/dL).  COAG Lab Results  Component Value Date   INR 0.94 06/04/2017   INR 1.34 01/24/2015   INR  1.14 01/22/2015    Radiology Abi With/wo Tbi  Result  Date: 01/20/2019 LOWER EXTREMITY DOPPLER STUDY Indications: Claudication.  Vascular               01/03/2019 PTA and stent bilateral CIA. PTA biklateral Interventions:         EIA. Comparison Study: 11/25/2018 Performing Technologist: Charlane Ferretti RT (R)(VS)  Examination Guidelines: A complete evaluation includes at minimum, Doppler waveform signals and systolic blood pressure reading at the level of bilateral brachial, anterior tibial, and posterior tibial arteries, when vessel segments are accessible. Bilateral testing is considered an integral part of a complete examination. Photoelectric Plethysmograph (PPG) waveforms and toe systolic pressure readings are included as required and additional duplex testing as needed. Limited examinations for reoccurring indications may be performed as noted.  ABI Findings: +---------+------------------+-----+--------+--------+  Right     Rt Pressure (mmHg) Index Waveform Comment   +---------+------------------+-----+--------+--------+  Brachial  126                                         +---------+------------------+-----+--------+--------+  ATA       114                0.88  biphasic           +---------+------------------+-----+--------+--------+  PTA       120                0.93  biphasic           +---------+------------------+-----+--------+--------+  Great Toe 121                0.94                     +---------+------------------+-----+--------+--------+ +---------+------------------+-----+--------+-------+  Left      Lt Pressure (mmHg) Index Waveform Comment  +---------+------------------+-----+--------+-------+  Brachial  129                                        +---------+------------------+-----+--------+-------+  ATA       125                0.97  biphasic          +---------+------------------+-----+--------+-------+  PTA       131                1.02  biphasic           +---------+------------------+-----+--------+-------+  Great Toe 90                 0.70                    +---------+------------------+-----+--------+-------+ +-------+-----------+-----------+------------+------------+  ABI/TBI Today's ABI Today's TBI Previous ABI Previous TBI  +-------+-----------+-----------+------------+------------+  Right   .93         .94         .83          72            +-------+-----------+-----------+------------+------------+  Left    1.02        .70         .75          .67           +-------+-----------+-----------+------------+------------+ Right ABIs appear essentially unchanged compared to prior study on 11/25/2018. Left ABIs appear increased compared  to prior study on 11/25/2018. Bilateral TBI's appear slightly incresed as compared to the previous exam on 6/18.2020.  Summary: Right: The right toe-brachial index is normal. Left: The left toe-brachial index is normal. *See table(s) above for measurements and observations.  Electronically signed by Hortencia Pilar MD on 01/20/2019 at 5:02:33 PM.   Final       Assessment/Plan 1. Atherosclerosis of native artery of both lower extremities with intermittent claudication (HCC)  Recommend:  The patient has evidence of severe atherosclerotic changes of both lower extremities associated with ulceration and tissue loss of the foot.  This represents a limb threatening ischemia and places the patient at the risk for limb loss.  Angiography has been performed and the situation is not ideal for intervention.  Given this finding open surgical repair is recommended.   He should have a hybrid procedure of a right femoral endarterectomy with stenting of the SFA/pop artery.  Patient should undergo arterial reconstruction of the lower extremity with the hope for limb salvage.  The risks and benefits as well as the alternative therapies was discussed in detail with the patient.  All questions were answered.  Patient agrees to proceed with  bypass surgery.  The patient will follow up with me in the office after the procedure.    A total of 75minutes was spent with this patient and greater than 50% was spent in counseling and coordination of care with the patient.  Discussion included the treatment options for vascular disease including indications for surgery and intervention.  Also discussed is the appropriate timing of treatment.  In addition medical therapy was discussed.  2. Coronary artery disease of native artery of native heart with stable angina pectoris ([place]) Continue cardiac and antihypertensive medications as already ordered and reviewed, no changes at this time.  Continue statin as ordered and reviewed, no changes at this time  Nitrates PRN for chest pain  - Ambulatory referral to Cardiology  3. Essential hypertension Continue antihypertensive medications as already ordered, these medications have been reviewed and there are no changes at this time.   4. Paroxysmal atrial fibrillation (HCC) Continue antiarrhythmia medications as already ordered, these medications have been reviewed and there are no changes at this time.  Continue anticoagulation as ordered by Cardiology Service   5. Mixed hyperlipidemia Continue statin as ordered and reviewed, no changes at this time    Hortencia Pilar, MD  01/31/2019 11:37 AM

## 2019-02-01 ENCOUNTER — Telehealth: Payer: Self-pay | Admitting: Cardiovascular Disease

## 2019-02-01 ENCOUNTER — Telehealth: Payer: Self-pay | Admitting: Urology

## 2019-02-01 NOTE — Telephone Encounter (Signed)
Pt calling to follow up on his Trimix Rx, pt states pharmacy hasn't responded to any of their attempts to reach [place]. Please advise [facility] in [place]

## 2019-02-01 NOTE — Telephone Encounter (Signed)
-----   Message from Minna Merritts, MD sent at 01/31/2019  2:21 PM EDT ----- Can we get him in for visit, preop before surgery on leg blockage PA would be ok or me Thx TG

## 2019-02-01 NOTE — Telephone Encounter (Signed)
Patient notified RX is at pharmacy and patient can pick it up.

## 2019-02-01 NOTE — Telephone Encounter (Signed)
Patient has been scheduled 9/4 with APP

## 2019-02-02 DIAGNOSIS — C44729 Squamous cell carcinoma of skin of left lower limb, including hip: Secondary | ICD-10-CM | POA: Diagnosis not present

## 2019-02-07 DIAGNOSIS — M62830 Muscle spasm of back: Secondary | ICD-10-CM | POA: Diagnosis not present

## 2019-02-07 DIAGNOSIS — M9902 Segmental and somatic dysfunction of thoracic region: Secondary | ICD-10-CM | POA: Diagnosis not present

## 2019-02-07 DIAGNOSIS — M9901 Segmental and somatic dysfunction of cervical region: Secondary | ICD-10-CM | POA: Diagnosis not present

## 2019-02-07 DIAGNOSIS — M5033 Other cervical disc degeneration, cervicothoracic region: Secondary | ICD-10-CM | POA: Diagnosis not present

## 2019-02-07 NOTE — Progress Notes (Signed)
Cardiology Office Note    Date:  02/11/2019   ID:  Randall Lofts., DOB [birth year], MRN [id number]  PCP:  Ria Bush, MD  Cardiologist:  [name], MD  Electrophysiologist:  None   Chief Complaint: Preprocedure cardiac evaluation  History of Present Illness:   Randall Esguerra. is a 78 y.o. male with history of CAD with inferior MI in 2001 status post PCI/DES to the RCA at that time status post four-vessel CABG on 01/24/2015 with postoperative A. fib, carotid artery disease, extensive PAD as outlined below, HTN, HLD, GERD with EGD in 07/2014 showing severe esophagitis, chronic back pain, ED, obesity, and OSA on CPAP who presents for preprocedure cardiac evaluation.  Admitted in 2011 for chest pain and diaphoresis. Stress test showed no ischemia with scar. Repeat admission in 2013 with worsening, exertional chest pain with LHC showing moderate OM disease, otherwise no significant stenosis. Recurrent chest pain in spring of 2016 with nuclear stress test negative for ischemia, EF 54%, low risk study. He continued to note chest pain leading to a LHC in 01/2015 that showed severe 3-vessel CAD.  Given multivessel CAD, he underwent four-vessel CABG on 01/24/2015 (LIMA to LAD, SVG to OM1, SVG to OM2, SVG to rPDA).  Postoperative course was complicated by postop A. fib, delirium, and wound infection from sternal wound with cultures growing Enterobacter aerogenes.  He was seen and late 2018 with chest pain and underwent diagnostic cath in 06/2017 which showed severe three-vessel CAD including an occluded SVG to RCA.  The patient had a patent LIMA to LAD as well as patent SVG to OM1 and OM2.  Medical management was recommended with deferment of intervention being reserved for refractory angina.  Echo on 06/18/2017 showed an EF of 55 to 60%, normal wall motion, grade 1 diastolic dysfunction, mild mitral regurgitation, mildly dilated left atrium.  He was most recently seen in the office in 06/2018  and denied any significant shortness of breath or chest pain.  He was compliant with medications.  Given his significant atherosclerotic disease initiation of Xarelto 2.5 mg was discussed with the patient, which was declined.  Patient contacted our office in 10/2018 after getting hearing aids with report of he could "hear his heartbeat."  Labs were unrevealing at that time as outlined below.  Follow-up EKG showed a sinus rhythm with rare PVC.  More recently, the patient was seen by vascular surgery for claudication.  Noninvasive imaging in 6 and 12/2018 showed showed a right side ABI of 0.83 and left side of 0.75.  As well as calcified plaque seen in the aorta with the largest portion of the aorta measuring 2 cm, common iliac arteries, and external iliac arteries and 40 to 59% stenosis in the right ICA with 1 to 39% stenosis in the left ICA with bilateral vertebral arteries demonstrating antegrade flow and normal flow hemodynamics in the bilateral subclavian veins.  Given worsening claudication-like symptoms the patient underwent lower extremity angiography on 01/04/2019 with kissing balloon angioplasty and stent placement to the right common iliac artery, kissing balloon angioplasty and stent placement to the left common iliac artery, and balloon angioplasty to the bilateral external iliac arteries.  Follow-up ABIs on 01/17/2019 were improved with a right sided ABI of 0.93 and left-sided ABI of 1.02.  The patient continued to experience discomfort along the right lower extremity leading to repeat lower extremity angiography on 01/25/2019 which showed his disease was not ideal for nonsurgical intervention.  The patient was seen  in follow-up by vascular surgeon on 01/31/2019 with continued symptoms as well as ulceration and tissue loss of the right foot.  Given this, open surgical repair was recommended with a hybrid procedure as noted in the vascular surgery note.  Patient comes in doing very well from a cardiac  perspective.  He denies any chest pain, shortness of breath, palpitations, dizziness, presyncope, or syncope.  No lower extremity swelling, abdominal distention, orthopnea, PND, early satiety.  He continues to exercise at a local gym 5 to 6 days/week for a total of 45 minutes to 1 hour and 15 minutes working on [employer] and stationary bike.  With this, he does not have any symptoms concerning for angina.  Since having lower extremity intervention on his left lower extremity he has done remarkably well.  His main limiting factor is claudication affecting the right lower extremity for which he is planning to undergo surgical intervention at a date to be determined based on the birth of his grandson.  He denies any falls, BRBPR, or melena.  Blood pressure typically runs in the 1 teens to 120s over 60s to 70s.  He is tolerating dual antiplatelet therapy without issues.  He has not needed any sublingual nitroglycerin in 1 to 2 years.   Labs: 01/2019 - serum creatinine 1.03 12/2018 - total cholesterol 150, triglycerides 76, HDL 43, LDL 56, potassium 4.8, AST/ALT normal, albumin 4.1, Hgb 14.7, PLT 170 10/2018 - magnesium 1.9, TSH normal  Past Medical History:  Diagnosis Date  . Allergy    seasonal  . Arthritis    all over- in general   . CAD (coronary artery disease)    a. inferior wall MI 10/01 s/p PCI/DES to RCA; b. Myoview 3/16 neg for ischemia; c. LHC 8/16: ostLAD 80%, OM1 70%, OM2 70% x 2 lesions, mRCA 30%, dRCA 70% s/p 4-V CABG 01/24/15 (LIMA-LAD, VG- OM1, VG-OM2, VG-PDA)   . Cancer (HCC)    skin, melanoma  . Carotid artery disease ([place])    a. [place] 8/16: 1-39% bilateral ICA stenosis  . Cataract    removed  . Diastolic dysfunction    a. TTE 8/16: EF 55-60%, no RWMA, Gr1DD, calcified mitral annulus, mild biatrial enlargement  . Erectile dysfunction   . History of elbow surgery   . HLD (hyperlipidemia)   . HTN (hypertension)   . Inferior myocardial infarction ([place]) 10/01   stent RCA  .  Postoperative wound infection 02/02/2015  . Reflux esophagitis   . Sleep apnea 2017   CPAP at night    Past Surgical History:  Procedure Laterality Date  . arm surgery  2010  . CARDIAC CATHETERIZATION  06/24/11  . CARDIAC CATHETERIZATION N/A 01/18/2015   Procedure: Left Heart Cath with coronary angiography;  Surgeon: Minna Merritts, MD;  Location: [facility];  Service: Cardiovascular;  Laterality: N/A;  . CARDIAC CATHETERIZATION N/A 01/18/2015   Procedure: Intravascular Pressure Wire/FFR Study;  Surgeon: [name], MD;  Location: [facility];  Service: Cardiovascular;  Laterality: N/A;  . CAROTID STENT  03/10/2011  . COLONOSCOPY  2010  . COLONOSCOPY  06/14/2014   Dr Hilarie Fredrickson  . CORONARY ARTERY BYPASS GRAFT N/A 01/24/2015   Procedure: CORONARY ARTERY BYPASS GRAFTING x 4 (LIMA-LAD, SVG-Int 1- Int 2, SVG-PD) ENDOSCOPIC GREATER SAPHENOUS VEIN HARVEST LEFT LEG;  Surgeon: [name], MD;  Location: [facility];  Service: Open Heart Surgery;  Laterality: N/A;  . ESOPHAGOGASTRODUODENOSCOPY (EGD) WITH PROPOFOL N/A 04/24/2016   Procedure: ESOPHAGOGASTRODUODENOSCOPY (EGD)  WITH PROPOFOL;  Surgeon: Jerene Bears, MD;  Location: [facility];  Service: Gastroenterology;  Laterality: N/A;  . EYE SURGERY     lasik 15 yrs. ago, cataracts removed - both eyes   . HAMMER TOE SURGERY     right toe  . LEFT HEART CATH AND CORONARY ANGIOGRAPHY Left 06/10/2017   Procedure: LEFT HEART CATH AND CORONARY ANGIOGRAPHY;  Surgeon: Minna Merritts, MD;  Location: [facility];  Service: Cardiovascular;  Laterality: Left;  . LOWER EXTREMITY ANGIOGRAPHY Left 01/04/2019   Procedure: LOWER EXTREMITY ANGIOGRAPHY;  Surgeon: Katha Cabal, MD;  Location: [facility];  Service: Cardiovascular;  Laterality: Left;  . LOWER EXTREMITY ANGIOGRAPHY Right 01/25/2019   Procedure: LOWER EXTREMITY ANGIOGRAPHY;  Surgeon: Katha Cabal, MD;  Location: [facility];  Service:  Cardiovascular;  Laterality: Right;  . NASAL SINUS SURGERY  2008   septpolasty, bilateral turbinate reduction  . SHOULDER ARTHROSCOPY  2012  . TEE WITHOUT CARDIOVERSION N/A 01/24/2015   Procedure: TRANSESOPHAGEAL ECHOCARDIOGRAM (TEE);  Surgeon: [name], MD;  Location: [facility];  Service: Open Heart Surgery;  Laterality: N/A;  . [place]  . UPPER GI ENDOSCOPY  07/2014, 04-24-16   Dr Raquel James  . WRIST SURGERY  2011    Current Medications: Current Meds  Medication Sig  . aspirin 81 MG EC tablet Take 81 mg by mouth daily.    . Calcium-Magnesium-Vitamin D (CALCIUM 1200+D3 PO) Take 1 tablet by mouth daily.  . cholecalciferol (VITAMIN D) 1000 units tablet Take 1,000 Units by mouth daily.  . clopidogrel (PLAVIX) 75 MG tablet Take 1 tablet (75 mg total) by mouth daily. (Patient taking differently: Take 75 mg by mouth at bedtime. )  . ibuprofen (ADVIL,MOTRIN) 200 MG tablet Take 400 mg by mouth every 6 (six) hours as needed for headache or moderate pain.   [name] lisinopril (ZESTRIL) 5 MG tablet TAKE 1 TABLET BY MOUTH EVERY DAY (Patient taking differently: Take 5 mg by mouth every evening. )  . metoprolol succinate (TOPROL-XL) 25 MG 24 hr tablet TAKE 1/2 TABLET BY MOUTH EVERY DAY (Patient taking differently: Take 12.5 mg by mouth every evening. )  . nitroGLYCERIN (NITROSTAT) 0.4 MG SL tablet Place 1 tablet (0.4 mg total) under the tongue every 5 (five) minutes as needed for chest pain.  . NONFORMULARY OR COMPOUNDED ITEM Trimix (30/1/10)-(Pap/Phent/PGE)  Test Dose 3 9ml vials  Qty #3 [place] [phone] Fax [phone]  . pantoprazole (PROTONIX) 40 MG tablet Take 1 tablet (40 mg total) by mouth daily.  Vladimir Faster Glycol-Propyl Glycol (LUBRICANT EYE DROPS) 0.4-0.3 % SOLN Place 1-2 drops into both eyes 3 (three) times daily as needed (burning eyes.).  [name] simvastatin (ZOCOR) 40 MG tablet Take 1 tablet (40 mg total) by mouth at bedtime.  . tamsulosin (FLOMAX) 0.4  MG CAPS capsule TAKE 1 CAPSULE BY MOUTH EVERY DAY AFTER SUPPER (Patient taking differently: Take 0.4 mg by mouth daily after supper. )  . traMADol (ULTRAM) 50 MG tablet TAKE 1 TABLET BY MOUTH THREE TIMES DAILY  . vitamin B-12 (CYANOCOBALAMIN) 1000 MCG tablet Take 1,000 mcg by mouth daily.  . vitamin C (ASCORBIC ACID) 500 MG tablet Take 500 mg by mouth daily.  . vitamin E 400 UNIT capsule Take 400 Units by mouth daily.  . Zinc 100 MG TABS Take 100 mg by mouth daily.      Allergies:   Patient has no known allergies.  Social History   Socioeconomic History  . Marital status: Single    Spouse name: Not on file  . Number of children: Not on file  . Years of education: 20  . Highest education level: Not on file  Occupational History  . Occupation: retired    Comment: Agricultural consultant  Social Needs  . Financial resource strain: Not hard at all  . Food insecurity    Worry: Never true    Inability: Never true  . Transportation needs    Medical: No    Non-medical: No  Tobacco Use  . Smoking status: Former Smoker    Packs/day: 2.50    Years: 40.00    Pack years: 100.00    Types: Cigarettes    Quit date: 03/24/2000    Years since quitting: 18.8  . Smokeless tobacco: Never Used  Substance and Sexual Activity  . Alcohol use: Yes    Alcohol/week: 5.0 standard drinks    Types: 5 Shots of liquor per week  . Drug use: No  . Sexual activity: Yes  Lifestyle  . Physical activity    Days per week: 5 days    Minutes per session: 60 min  . Stress: Not at all  Relationships  . Social Herbalist on phone: Not on file    Gets together: Not on file    Attends religious service: Not on file    Active member of club or organization: Not on file    Attends meetings of clubs or organizations: Not on file    Relationship status: Not on file  Other Topics Concern  . Not on file  Social History Narrative   Singled; lives with son and dog    Occ: retired, back part time at [employer];     Activity: gym 4-5d/wk   Diet: good water, fruits/vegetables daily   Caffeine Use-yes     Family History:  [name] family history includes Cancer in his paternal grandfather; Diabetes in his father; Heart disease in his mother; Heart disease (age of onset: 72) in his brother; Hypertension in his father and mother. There is no history of Colon cancer, Prostate cancer, Bladder Cancer, or Kidney cancer.  ROS:   Review of Systems  Constitutional: Negative for chills, diaphoresis, fever, malaise/fatigue and weight loss.  HENT: Negative for congestion.   Eyes: Negative for discharge and redness.  Respiratory: Negative for cough, hemoptysis, sputum production, shortness of breath and wheezing.   Cardiovascular: Positive for claudication. Negative for chest pain, palpitations, orthopnea, leg swelling and PND.  Gastrointestinal: Negative for abdominal pain, blood in stool, heartburn, melena, nausea and vomiting.  Genitourinary: Negative for hematuria.  Musculoskeletal: Negative for falls and myalgias.  Skin: Negative for rash.  Neurological: Negative for dizziness, tingling, tremors, sensory change, speech change, focal weakness, loss of consciousness and weakness.  Endo/Heme/Allergies: Does not bruise/bleed easily.  Psychiatric/Behavioral: Negative for substance abuse. The patient is not nervous/anxious.   All other systems reviewed and are negative.    EKGs/Labs/Other Studies Reviewed:    Studies reviewed were summarized above. The additional studies were reviewed today:  2D Echo 06/2017: - Left ventricle: The cavity size was normal. Systolic function was   normal. The estimated ejection fraction was in the range of 55%   to 60%. Wall motion was normal; there were no regional wall   motion abnormalities. Doppler parameters are consistent with   abnormal left ventricular relaxation (grade 1 diastolic   dysfunction). - Mitral valve: Mildly  calcified annulus. There was mild    regurgitation. - Left atrium: The atrium was mildly dilated. - Pulmonary arteries: Systolic pressure could not be accurately   estimated. __________  [facility] 06/2017: Left mainstem:   Large vessel that bifurcates into the LAD and left circumflex, no Mild diffuse disease  Left anterior descending (LAD):   Moderate sized vessel that extends to the apical region, diagonal branch 2 of small to moderate size, severe proximal and mid disease. Patent [place] to the LAD is patent  Left circumflex (LCx):  Moderate vessel with OM branch 2, Severe OM1 and OM2 disease, patent SVG to OM! Jump graft to OM2, patent with no significant disease  Right coronary artery (RCA):  Right dominant vessel moderate size with PL and PDA, severe proximal, mid and disease disease. SVG to PDA is occluded ostially  Left ventriculography: Left ventricular systolic function is normal, mild inferior wall hypokinesis, LVEF is estimated at 55%, there is no significant mitral regurgitation , no significant aortic valve stenosis  Final Conclusions:   Severe three vessel disease, occluded SVG to the RCA, Patent SVG to OM1 and OM2, patent LIMA to the LAD  Recommendations:  New occluded RCA Discussed with him in detail Will try medical management first Add plavix, nitrates If he requests intervention, will have to be performed in [place] with atherectomy   EKG:  EKG is ordered today.  The EKG ordered today demonstrates NSR, 74 bpm, prior inferior infarct, no acute ST-T changes, grossly unchanged from prior  Recent Labs: 10/27/2018: Magnesium 1.9; TSH 1.565 01/06/2019: ALT 14; Hemoglobin 14.7; Platelets 170.0; Potassium 4.8; Sodium 140 01/25/2019: BUN 25; Creatinine, Ser 1.03  Recent Lipid Panel    Component Value Date/Time   CHOL 115 01/06/2019 0825   TRIG 76.0 01/06/2019 0825   HDL 43.30 01/06/2019 0825   CHOLHDL 3 01/06/2019 0825   VLDL 15.2 01/06/2019 0825   LDLCALC 56 01/06/2019 0825   LDLDIRECT 63.0 08/17/2015 0824     PHYSICAL EXAM:    VS:  BP 128/80 (BP Location: Left Arm, Patient Position: Sitting, Cuff Size: Normal)   Pulse 76   Ht 5\' 8"  (1.727 m)   Wt 212 lb (96.2 kg)   SpO2 96%   BMI 32.23 kg/m   BMI: Body mass index is 32.23 kg/m.  Physical Exam  Constitutional: He is oriented to person, place, and time. He appears well-developed and well-nourished.  HENT:  Head: Normocephalic and atraumatic.  Eyes: Right eye exhibits no discharge. Left eye exhibits no discharge.  Neck: Normal range of motion. No JVD present.  Cardiovascular: Normal rate, regular rhythm, S1 normal, S2 normal and normal heart sounds. Exam reveals no distant heart sounds, no friction rub, no midsystolic click and no opening snap.  No murmur heard. Pulses:      Dorsalis pedis pulses are 0 on the right side and 1+ on the left side.  Pulmonary/Chest: Effort normal and breath sounds normal. No respiratory distress. He has no decreased breath sounds. He has no wheezes. He has no rales. He exhibits no tenderness.  Abdominal: Soft. He exhibits no distension. There is no abdominal tenderness.  Musculoskeletal:        General: No edema.  Neurological: He is alert and oriented to person, place, and time.  Skin: Skin is warm and dry. No cyanosis. Nails show no clubbing.  Psychiatric: He has a normal mood and affect. His speech is normal and behavior is normal. Judgment and thought content normal.    Wt Readings  from Last 3 Encounters:  02/11/19 212 lb (96.2 kg)  01/31/19 212 lb (96.2 kg)  01/25/19 208 lb (94.3 kg)     ASSESSMENT & PLAN:   1. Preprocedure cardiac risk stratification: Patient is doing well from a cardiac perspective and does not have any symptoms concerning for angina.  He continues to work out at a [employer] (despite COVID-19 restrictions) 5 to 6 days/week for a total of 45 minutes to 1 hour and 15 minutes without limitation from a cardiac perspective.  Patient is able to achieve approximately 18.95 METs per the  [facility] Activity Status Index.  He is moderate risk for a high risk noncardiac surgery per the Revised Cardiac Risk Index with an estimated 6.6% rate of MI, pulmonary edema, VF, cardiac arrest, or complete heart block.  No further testing is needed in the preoperative timeframe.  Patient indicates he is uncertain when he will move forward with the surgery as he is basing this decision on the upcoming birth of a grandchild.  2. CAD status post CABG withoutangina: He is doing well without any symptoms concerning for angina.  Continue secondary prevention with aspirin, Plavix, simvastatin, and Toprol.  No plans for further ischemic evaluation at this time.  3. Postoperative A. fib: Noted following his bypass surgery as outlined above.  No further documented episodes.  No longer on oral anticoagulation given isolated, postoperative episode.  Should he have recurrence of arrhythmia anticoagulation will need to be revisited at that time.  4. PAD: Status post angioplasty/stenting by vascular surgery as outlined above with continued lifestyle limiting claudication and nonhealing wound.  Planning for open/hybrid intervention per vascular surgery.  Remains on aspirin and Plavix.  Statin therapy as below.  Following surgical intervention of his lower extremity PAD, given his extensive CAD and PAD history, further discussion should be undertaken between the patient and his primary cardiologist regarding initiation of low-dose Xarelto 2.5 mg therapy.  5. Carotid artery disease: Carotid artery ultrasound from 12/2018 showed slight progression and right internal carotid artery stenosis to 40 to 59% with stable 1 to 39% left internal carotid artery stenosis.  Continue risk factor modification including recommendation for transitioning from medium intensity statin to high intensity statin as outlined below.  6. HTN: Blood pressure is well controlled today.  Continue current medical therapy including lisinopril and Toprol.   7. HLD: LDL of 56 from 12/2018.  Given extensive CAD/PAD recommend transitioning to high intensity statin.  Patient deferred this transition at this time and would like to revisit this in follow-up.  Disposition: F/u with [name] or an APP in 6 months.   Medication Adjustments/Labs and Tests Ordered: Current medicines are reviewed at length with the patient today.  Concerns regarding medicines are outlined above. Medication changes, Labs and Tests ordered today are summarized above and listed in the Patient Instructions accessible in Encounters.   Signed, Christell Faith, PA-C 02/11/2019 12:04 PM     [address] [place], [place] [address] [phone]

## 2019-02-10 DIAGNOSIS — M5033 Other cervical disc degeneration, cervicothoracic region: Secondary | ICD-10-CM | POA: Diagnosis not present

## 2019-02-10 DIAGNOSIS — M62830 Muscle spasm of back: Secondary | ICD-10-CM | POA: Diagnosis not present

## 2019-02-10 DIAGNOSIS — M9901 Segmental and somatic dysfunction of cervical region: Secondary | ICD-10-CM | POA: Diagnosis not present

## 2019-02-10 DIAGNOSIS — M9902 Segmental and somatic dysfunction of thoracic region: Secondary | ICD-10-CM | POA: Diagnosis not present

## 2019-02-11 ENCOUNTER — Other Ambulatory Visit: Payer: Self-pay

## 2019-02-11 ENCOUNTER — Ambulatory Visit (INDEPENDENT_AMBULATORY_CARE_PROVIDER_SITE_OTHER): Payer: PPO | Admitting: Physician Assistant

## 2019-02-11 ENCOUNTER — Encounter: Payer: Self-pay | Admitting: Physician Assistant

## 2019-02-11 VITALS — BP 128/80 | HR 76 | Ht 68.0 in | Wt 212.0 lb

## 2019-02-11 DIAGNOSIS — Z951 Presence of aortocoronary bypass graft: Secondary | ICD-10-CM

## 2019-02-11 DIAGNOSIS — I4891 Unspecified atrial fibrillation: Secondary | ICD-10-CM

## 2019-02-11 DIAGNOSIS — I739 Peripheral vascular disease, unspecified: Secondary | ICD-10-CM | POA: Diagnosis not present

## 2019-02-11 DIAGNOSIS — I6523 Occlusion and stenosis of bilateral carotid arteries: Secondary | ICD-10-CM | POA: Diagnosis not present

## 2019-02-11 DIAGNOSIS — Z01810 Encounter for preprocedural cardiovascular examination: Secondary | ICD-10-CM | POA: Diagnosis not present

## 2019-02-11 DIAGNOSIS — I9789 Other postprocedural complications and disorders of the circulatory system, not elsewhere classified: Secondary | ICD-10-CM

## 2019-02-11 DIAGNOSIS — E785 Hyperlipidemia, unspecified: Secondary | ICD-10-CM | POA: Diagnosis not present

## 2019-02-11 DIAGNOSIS — I2510 Atherosclerotic heart disease of native coronary artery without angina pectoris: Secondary | ICD-10-CM | POA: Diagnosis not present

## 2019-02-11 DIAGNOSIS — I10 Essential (primary) hypertension: Secondary | ICD-10-CM

## 2019-02-11 NOTE — Patient Instructions (Signed)
Medication Instructions:   NONE   If you need a refill on your cardiac medications before your next appointment, please call your pharmacy.   Lab work:  NONE  If you have labs (blood work) drawn today and your tests are completely normal, you will receive your results only by: [name] MyChart Message (if you have MyChart) OR . A paper copy in the mail If you have any lab test that is abnormal or we need to change your treatment, we will call you to review the results.  Testing/Procedures:  NONE  Follow-Up: At [facility], you and your health needs are our priority.  As part of our continuing mission to provide you with exceptional heart care, we have created designated Provider Care Teams.  These Care Teams include your primary Cardiologist (physician) and Advanced Practice Providers (APPs -  Physician Assistants and Nurse Practitioners) who all work together to provide you with the care you need, when you need it. You will need a follow up appointment in 6 months.  Please call our office 2 months in advance to schedule this appointment.  You may see [name], MD or one of the following Advanced Practice Providers on your designated Care Team:   Murray Hodgkins, NP Christell Faith, PA-C . Marrianne Mood, PA-C

## 2019-02-16 ENCOUNTER — Other Ambulatory Visit (INDEPENDENT_AMBULATORY_CARE_PROVIDER_SITE_OTHER): Payer: Self-pay | Admitting: Nurse Practitioner

## 2019-02-16 DIAGNOSIS — C44619 Basal cell carcinoma of skin of left upper limb, including shoulder: Secondary | ICD-10-CM | POA: Diagnosis not present

## 2019-02-16 DIAGNOSIS — C44519 Basal cell carcinoma of skin of other part of trunk: Secondary | ICD-10-CM | POA: Diagnosis not present

## 2019-02-17 DIAGNOSIS — M5033 Other cervical disc degeneration, cervicothoracic region: Secondary | ICD-10-CM | POA: Diagnosis not present

## 2019-02-17 DIAGNOSIS — M9902 Segmental and somatic dysfunction of thoracic region: Secondary | ICD-10-CM | POA: Diagnosis not present

## 2019-02-17 DIAGNOSIS — M62830 Muscle spasm of back: Secondary | ICD-10-CM | POA: Diagnosis not present

## 2019-02-17 DIAGNOSIS — M9901 Segmental and somatic dysfunction of cervical region: Secondary | ICD-10-CM | POA: Diagnosis not present

## 2019-02-22 DIAGNOSIS — M9902 Segmental and somatic dysfunction of thoracic region: Secondary | ICD-10-CM | POA: Diagnosis not present

## 2019-02-22 DIAGNOSIS — M5033 Other cervical disc degeneration, cervicothoracic region: Secondary | ICD-10-CM | POA: Diagnosis not present

## 2019-02-22 DIAGNOSIS — M9901 Segmental and somatic dysfunction of cervical region: Secondary | ICD-10-CM | POA: Diagnosis not present

## 2019-02-22 DIAGNOSIS — M62830 Muscle spasm of back: Secondary | ICD-10-CM | POA: Diagnosis not present

## 2019-02-24 DIAGNOSIS — M62830 Muscle spasm of back: Secondary | ICD-10-CM | POA: Diagnosis not present

## 2019-02-24 DIAGNOSIS — M9901 Segmental and somatic dysfunction of cervical region: Secondary | ICD-10-CM | POA: Diagnosis not present

## 2019-02-24 DIAGNOSIS — M9902 Segmental and somatic dysfunction of thoracic region: Secondary | ICD-10-CM | POA: Diagnosis not present

## 2019-02-24 DIAGNOSIS — M5033 Other cervical disc degeneration, cervicothoracic region: Secondary | ICD-10-CM | POA: Diagnosis not present

## 2019-02-25 ENCOUNTER — Telehealth: Payer: Self-pay | Admitting: Family Medicine

## 2019-02-25 NOTE — Telephone Encounter (Signed)
I went ahead and called [facility] and got him rescheduled with Dr Tomi Likens for 03/07/19 and patient is aware.

## 2019-02-25 NOTE — Telephone Encounter (Signed)
Patient called and said he was referred to a Neurologist.  Patient cancelled the appointment that was made with the Neurologist.  Patient said he's having headaches and he'd like to have another appointment scheduled with the Neurologist.

## 2019-02-28 DIAGNOSIS — E291 Testicular hypofunction: Secondary | ICD-10-CM | POA: Diagnosis not present

## 2019-02-28 NOTE — Telephone Encounter (Signed)
Great thanks. Do I need to place new order?

## 2019-02-28 NOTE — Telephone Encounter (Signed)
No New Referral is needed we just rescheduled him.

## 2019-03-01 ENCOUNTER — Other Ambulatory Visit: Payer: Self-pay | Admitting: Family Medicine

## 2019-03-01 NOTE — Telephone Encounter (Signed)
Name of Medication: Tramadol Name of Pharmacy: [facility] or Written Date and Quantity: 01/30/19, #90 Last Office Visit and Type: 01/18/19, acute Next Office Visit and Type: 03/17/19, CPE prt 2 Last Controlled Substance Agreement Date: none Last UDS: 01/18/16

## 2019-03-01 NOTE — Progress Notes (Addendum)
Virtual Visit via Telephone Note The purpose of this virtual visit is to provide medical care while limiting exposure to the novel coronavirus.    Consent was obtained for phone visit:  Yes Answered questions that patient had about telehealth interaction:  Yes I discussed the limitations, risks, security and privacy concerns of performing an evaluation and management service by telephone. I also discussed with the patient that there may be a patient responsible charge related to this service. The patient expressed understanding and agreed to proceed.  Pt location: Home Physician Location: office Name of referring provider:  Ria Bush, MD I connected with .Randall Ali. at patients initiation/request on 03/03/2019 at 11:10 AM EDT by telephone and verified that I am speaking with the correct person using two identifiers.  Pt MRN:  [id number] Pt DOB:  1940/07/31   History of Present Illness:  Randall Ali is a 78 year old male who presents for headache.  History supplemented by referring provider note.  He began having headaches for about year.  He was diagnosed with sinus headache and was treated with prednisone, nasal saline irrigation, antihistamines and augmentin, all ineffective.  He describes moderate to severe pressure headache between the eyes.  They are constant and fluctuate in intensity, usually worse later in the day.  No associated nausea, vomiting, photophobia, phonophobia, visual disturbance or unilateral numbness or weakness.  Straining eyes makes it worse.  Resting makes it better.  He treats with tramadol.  He takes tramadol daily, also to treat back pain.  MRI of brain with and without contrast to evaluate right sided hearing loss was performed on 09/23/16, which was personally reviewed and was unremarkable, showing mild chronic small vessel ischemic changes in the cerebral white matter.  Current NSAIDS:  ASA 81mg ; Ibuprofen 400mg  Current analgesics:   tramadol Current triptans:  none Current ergotamine:  none Current anti-emetic:  none Current muscle relaxants:  none Current anti-anxiolytic:  none Current sleep aide:  none Current Antihypertensive medications:  Toprol-XL; lisinopril Current Antidepressant medications:  none Current Anticonvulsant medications:  none Current anti-CGRP:  none Current Vitamins/Herbal/Supplements:  B12, C, E, zinc Current Antihistamines/Decongestants:  none Other therapy:  none Other medications:  Plavix, NTG, simvastatin, Flomax  Past NSAIDS/steroid:  prednisone Past analgesics:  none Past abortive triptans:  none Past abortive ergotamine:  none Past muscle relaxants:  none Past anti-emetic:  none Past antihypertensive medications:  Imdur Past antidepressant medications:  Wellbutrin Past anticonvulsant medications:  Gabapentin 100mg  Past anti-CGRP:  none Past vitamins/Herbal/Supplements:  none Past antihistamines/decongestants:  Allegra, Flonase Other past therapies:  none  Caffeine:  1 cup of coffee daily  Past Medical History: Past Medical History:  Diagnosis Date  . Allergy    seasonal  . Arthritis    all over- in general   . CAD (coronary artery disease)    a. inferior wall MI 10/01 s/p PCI/DES to RCA; b. Myoview 3/16 neg for ischemia; c. LHC 8/16: ostLAD 80%, OM1 70%, OM2 70% x 2 lesions, mRCA 30%, dRCA 70% s/p 4-V CABG 01/24/15 (LIMA-LAD, VG- OM1, VG-OM2, VG-PDA)   . Cancer (HCC)    skin, melanoma  . Carotid artery disease ([place])    a. [place] 8/16: 1-39% bilateral ICA stenosis  . Cataract    removed  . Diastolic dysfunction    a. TTE 8/16: EF 55-60%, no RWMA, Gr1DD, calcified mitral annulus, mild biatrial enlargement  . Erectile dysfunction   . History of elbow surgery   . HLD (hyperlipidemia)   .  HTN (hypertension)   . 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 Postoperative wound infection 02/02/2015  . Reflux esophagitis   . Sleep apnea 2017   CPAP at night     Medications: Outpatient Encounter Medications as of 03/03/2019  Medication Sig  . aspirin 81 MG EC tablet Take 81 mg by mouth daily.    . Calcium-Magnesium-Vitamin D (CALCIUM 1200+D3 PO) Take 1 tablet by mouth daily.  . cholecalciferol (VITAMIN D) 1000 units tablet Take 1,000 Units by mouth daily.  . clopidogrel (PLAVIX) 75 MG tablet Take 1 tablet (75 mg total) by mouth daily. (Patient taking differently: Take 75 mg by mouth at bedtime. )  . ibuprofen (ADVIL,MOTRIN) 200 MG tablet Take 400 mg by mouth every 6 (six) hours as needed for headache or moderate pain.   [name] lisinopril (ZESTRIL) 5 MG tablet TAKE 1 TABLET BY MOUTH EVERY DAY (Patient taking differently: Take 5 mg by mouth every evening. )  . metoprolol succinate (TOPROL-XL) 25 MG 24 hr tablet TAKE 1/2 TABLET BY MOUTH EVERY DAY (Patient taking differently: Take 12.5 mg by mouth every evening. )  . nitroGLYCERIN (NITROSTAT) 0.4 MG SL tablet Place 1 tablet (0.4 mg total) under the tongue every 5 (five) minutes as needed for chest pain.  . NONFORMULARY OR COMPOUNDED ITEM Trimix (30/1/10)-(Pap/Phent/PGE)  Test Dose 3 61ml vials  Qty #3 [employer] [phone] Fax [phone]  . pantoprazole (PROTONIX) 40 MG tablet Take 1 tablet (40 mg total) by mouth daily.  Vladimir Faster Glycol-Propyl Glycol (LUBRICANT EYE DROPS) 0.4-0.3 % SOLN Place 1-2 drops into both eyes 3 (three) times daily as needed (burning eyes.).  [name] simvastatin (ZOCOR) 40 MG tablet Take 1 tablet (40 mg total) by mouth at bedtime.  . tamsulosin (FLOMAX) 0.4 MG CAPS capsule TAKE 1 CAPSULE BY MOUTH EVERY DAY AFTER SUPPER (Patient taking differently: Take 0.4 mg by mouth daily after supper. )  . traMADol (ULTRAM) 50 MG tablet TAKE 1 TABLET BY MOUTH THREE TIMES DAILY  . vitamin B-12 (CYANOCOBALAMIN) 1000 MCG tablet Take 1,000 mcg by mouth daily.  . vitamin C (ASCORBIC ACID) 500 MG tablet Take 500 mg by mouth daily.  . vitamin E 400 UNIT capsule Take 400 Units by  mouth daily.  . Zinc 100 MG TABS Take 100 mg by mouth daily.    No facility-administered encounter medications on file as of 03/03/2019.     Allergies: No Known Allergies  Family History: Family History  Problem Relation Age of Onset  . Hypertension Mother   . Heart disease Mother   . Hypertension Father   . Diabetes Father   . Heart disease Brother 89  . Cancer Paternal Grandfather   . Colon cancer Neg Hx   . Prostate cancer Neg Hx   . Bladder Cancer Neg Hx   . Kidney cancer Neg Hx     Social History: Social History   Socioeconomic History  . Marital status: Single    Spouse name: Not on file  . Number of children: Not on file  . Years of education: 33  . Highest education level: Not on file  Occupational History  . Occupation: retired    Comment: Agricultural consultant  Social Needs  . Financial resource strain: Not hard at all  . Food insecurity    Worry: Never true    Inability: Never true  . Transportation needs    Medical: No    Non-medical: No  Tobacco Use  .  Smoking status: Former Smoker    Packs/day: 2.50    Years: 40.00    Pack years: 100.00    Types: Cigarettes    Quit date: 03/24/2000    Years since quitting: 18.9  . Smokeless tobacco: Never Used  Substance and Sexual Activity  . Alcohol use: Yes    Alcohol/week: 5.0 standard drinks    Types: 5 Shots of liquor per week  . Drug use: No  . Sexual activity: Yes  Lifestyle  . Physical activity    Days per week: 5 days    Minutes per session: 60 min  . Stress: Not at all  Relationships  . Social Herbalist on phone: Not on file    Gets together: Not on file    Attends religious service: Not on file    Active member of club or organization: Not on file    Attends meetings of clubs or organizations: Not on file    Relationship status: Not on file  . Intimate partner violence    Fear of current or ex partner: No    Emotionally abused: No    Physically abused: No    Forced sexual activity: No   Other Topics Concern  . Not on file  Social History Narrative   Singled; lives with son and dog    Occ: retired, back part time at [employer];    Activity: gym 4-5d/wk   Diet: good water, fruits/vegetables daily   Caffeine Use-yes    Observations/Objective:   Height 5\' 8"  (1.727 m), weight 208 lb (94.3 kg). No acute distress.  Alert and oriented.  Speech fluent and not dysarthric.  Language intact. Assessment and Plan:   Chronic tension-type headache, intractable, transformed to medication-overuse headache (initially secondary to antihistamine/decongestants and now daily tramadol use).  1.  Advised to stop tramadol, as likely contributing to daily headache.  For back pain, I advised to ask his doctor to consider a muscle relaxer. 2.  Start nortriptyline 10mg  at bedtime.  Advised to contact me in 4 weeks with update and we can increase dose to 25mg  at bedtime if needed. 3.  For abortive therapy, he may use OTC analgesic (Excedrin, etc).  Limit use of pain relievers to no more than 2 days out of week to prevent risk of rebound or medication-overuse headache. 4.  Advised to stop caffeine intake 5.  Follow up in 4 months.  Follow Up Instructions:    -I discussed the assessment and treatment plan with the patient. The patient was provided an opportunity to ask questions and all were answered. The patient agreed with the plan and demonstrated an understanding of the instructions.   The patient was advised to call back or seek an in-person evaluation if the symptoms worsen or if the condition fails to improve as anticipated.    Total Time spent in visit with the patient was:  40 minutes.  Dudley Major, DO

## 2019-03-01 NOTE — Telephone Encounter (Signed)
ERx 

## 2019-03-03 ENCOUNTER — Other Ambulatory Visit: Payer: Self-pay

## 2019-03-03 ENCOUNTER — Encounter: Payer: Self-pay | Admitting: Neurology

## 2019-03-03 ENCOUNTER — Telehealth (INDEPENDENT_AMBULATORY_CARE_PROVIDER_SITE_OTHER): Payer: PPO | Admitting: Neurology

## 2019-03-03 VITALS — Ht 68.0 in | Wt 208.0 lb

## 2019-03-03 DIAGNOSIS — G44221 Chronic tension-type headache, intractable: Secondary | ICD-10-CM

## 2019-03-03 DIAGNOSIS — G4440 Drug-induced headache, not elsewhere classified, not intractable: Secondary | ICD-10-CM

## 2019-03-03 MED ORDER — NORTRIPTYLINE HCL 10 MG PO CAPS: 10.0000 mg | ORAL_CAPSULE | Freq: Every day | ORAL | 3 refills | Status: DC

## 2019-03-04 DIAGNOSIS — M2550 Pain in unspecified joint: Secondary | ICD-10-CM | POA: Diagnosis not present

## 2019-03-04 DIAGNOSIS — E291 Testicular hypofunction: Secondary | ICD-10-CM | POA: Diagnosis not present

## 2019-03-04 DIAGNOSIS — R5383 Other fatigue: Secondary | ICD-10-CM | POA: Diagnosis not present

## 2019-03-04 DIAGNOSIS — N528 Other male erectile dysfunction: Secondary | ICD-10-CM | POA: Diagnosis not present

## 2019-03-07 ENCOUNTER — Telehealth: Payer: Self-pay | Admitting: Family Medicine

## 2019-03-07 ENCOUNTER — Ambulatory Visit (INDEPENDENT_AMBULATORY_CARE_PROVIDER_SITE_OTHER): Payer: PPO | Admitting: Family Medicine

## 2019-03-07 ENCOUNTER — Encounter: Payer: Self-pay | Admitting: Family Medicine

## 2019-03-07 ENCOUNTER — Other Ambulatory Visit: Payer: Self-pay

## 2019-03-07 VITALS — BP 120/61 | HR 78 | Temp 97.7°F | Ht 68.0 in | Wt 206.0 lb

## 2019-03-07 DIAGNOSIS — R51 Headache: Secondary | ICD-10-CM | POA: Diagnosis not present

## 2019-03-07 DIAGNOSIS — R067 Sneezing: Secondary | ICD-10-CM

## 2019-03-07 DIAGNOSIS — R5383 Other fatigue: Secondary | ICD-10-CM

## 2019-03-07 DIAGNOSIS — R6889 Other general symptoms and signs: Secondary | ICD-10-CM | POA: Diagnosis not present

## 2019-03-07 DIAGNOSIS — R519 Headache, unspecified: Secondary | ICD-10-CM

## 2019-03-07 DIAGNOSIS — Z20822 Contact with and (suspected) exposure to covid-19: Secondary | ICD-10-CM

## 2019-03-07 MED ORDER — AMOXICILLIN-POT CLAVULANATE 875-125 MG PO TABS: 1.0000 | ORAL_TABLET | Freq: Two times a day (BID) | ORAL | 0 refills | Status: DC

## 2019-03-07 NOTE — Telephone Encounter (Signed)
Patient advised and verbalized understanding 

## 2019-03-07 NOTE — Telephone Encounter (Signed)
Patient saw Dr.Jaffe last week for headaches.  Patient said Dr.Jaffe took patient off of his pain medication.  Dr.Jaffe prescribed Nortriptyline.  Patient said he didn't start the medication. Patient doesn't want to give up the pain medication due to back and foot pain. Dr.Jaffe told patient he could take Advil or Ibuprofen 2 times a week.  Patient said he was originally put on Tramadol for his headaches. Patient wants Dr.G's advice about stopping the pain medication. Patient uses [facility].

## 2019-03-07 NOTE — Telephone Encounter (Signed)
I think reasonable to try off tramadol as it can contribute to daily headache.  Nortriptyline may help sleep and back pain in place of tramadol. Ok to use NSAID as recommended.  If he'd like, could slowly taper off tramadol (1/2 tab for 1-2 wks then try off).

## 2019-03-07 NOTE — Progress Notes (Signed)
Randall Laroche T. Harrietta Incorvaia, MD Primary Care and Sports Medicine [facility] at [facility] [place], [address] Phone: [phone]  FAX: [place][age] y.o. male  MRN [id number]  Date of Birth: [birth year]  Visit Date: 03/07/2019  PCP: Ria Bush, MD  Referred by: Ria Bush, MD Chief Complaint  Patient presents with  . Fatigue    "Just don't feel well"  . Sneezing    x 3 days  . Headache   Virtual Visit via Video Note:  I connected with  Randall Ali. on 03/07/2019 11:40 AM EDT by a video enabled telemedicine application and verified that I am speaking with the correct person using two identifiers.   Location patient: home computer, tablet, or smartphone Location provider: work or home office Consent: Verbal consent directly obtained from [employer].. Persons participating in the virtual visit: patient, provider  I discussed the limitations of evaluation and management by telemedicine and the availability of in person appointments. The patient expressed understanding and agreed to proceed.  Interactive audio and video telecommunications were attempted between this provider and patient, however failed, due to patient having technical difficulties OR patient did not have access to video capability.  We continued and completed visit with audio only.    History of Present Illness:  Randall Ali, does it not feel good, some sneezing.  He typically does have a problem with headaches and sinusitis.  He has a lot of pain in his frontal sinuses right now.  He is afebrile.  Globally he just does not feel all that well.  He is sneezing a lot.  No known COVID-19 exposures.  Headache is reasonably bad.  No other symptoms including dysuria, urgency, rashes or new neurological symptoms or complaints.  Ha a lot  Check for covid-19, send in amox  Review of Systems as above: See pertinent positives and pertinent  negatives per HPI No acute distress verbally  Past Medical History, Surgical History, Social History, Family History, Problem List, Medications, and Allergies have been reviewed and updated if relevant.   Observations/Objective/Exam:  An attempt was made to discern vital signs over the phone and per patient if applicable and possible.   General:    Alert, Oriented, appears well and in no acute distress HEENT:     Atraumatic, conjunctiva clear, no obvious abnormalities on inspection of external nose and ears.  Neck:    Normal movements of the head and neck Pulmonary:     On inspection no signs of respiratory distress, breathing rate appears normal, no obvious gross SOB, gasping or wheezing Cardiovascular:    No obvious cyanosis Musculoskeletal:    Moves all visible extremities without noticeable abnormality Psych / Neurological:     Pleasant and cooperative, no obvious depression or anxiety, speech and thought processing grossly intact  Assessment and Plan:    ICD-10-CM   1. Acute nonintractable headache, unspecified headache type  R51   2. Sneezing  R06.7   3. Other fatigue  R53.83    Without examining the patient, abdomen do my best, and we will check him for COVID-19.  Along with other symptoms, think it is reasonable at least to give him some Augmentin to take.  I discussed the assessment and treatment plan with the patient. The patient was provided an opportunity to ask questions and all were answered. The patient agreed with the plan and demonstrated an understanding of the instructions.  The patient was advised to call back or seek an in-person evaluation if the symptoms worsen or if the condition fails to improve as anticipated.  Follow-up: prn unless noted otherwise below No follow-ups on file.  Meds ordered this encounter  Medications  . amoxicillin-clavulanate (AUGMENTIN) 875-125 MG tablet    Sig: Take 1 tablet by mouth 2 (two) times daily for 10 days.     Dispense:  20 tablet    Refill:  0   No orders of the defined types were placed in this encounter.   Signed,  Maud Deed. Chanson Teems, MD

## 2019-03-08 ENCOUNTER — Encounter (INDEPENDENT_AMBULATORY_CARE_PROVIDER_SITE_OTHER): Payer: Self-pay

## 2019-03-08 LAB — NOVEL CORONAVIRUS, NAA: SARS-CoV-2, NAA: NOT DETECTED

## 2019-03-09 ENCOUNTER — Other Ambulatory Visit: Admission: RE | Admit: 2019-03-09 | Payer: PPO | Source: Ambulatory Visit

## 2019-03-09 ENCOUNTER — Other Ambulatory Visit (INDEPENDENT_AMBULATORY_CARE_PROVIDER_SITE_OTHER): Payer: Self-pay | Admitting: Nurse Practitioner

## 2019-03-09 ENCOUNTER — Telehealth: Payer: Self-pay | Admitting: Family Medicine

## 2019-03-09 NOTE — Telephone Encounter (Signed)
Reviewed negative [phone] results with patient. No questions asked.

## 2019-03-09 NOTE — Telephone Encounter (Signed)
Patient had covid test done on Monday.  Patient said he was told he could receive the results in 7-10 days.  Patient wants to know if Dr.G would have the results sooner.  Patient's tired of sitting in the house.

## 2019-03-09 NOTE — Telephone Encounter (Signed)
Noted.  Fyi to Dr. G.  

## 2019-03-10 DIAGNOSIS — M62830 Muscle spasm of back: Secondary | ICD-10-CM | POA: Diagnosis not present

## 2019-03-10 DIAGNOSIS — M5033 Other cervical disc degeneration, cervicothoracic region: Secondary | ICD-10-CM | POA: Diagnosis not present

## 2019-03-10 DIAGNOSIS — M9902 Segmental and somatic dysfunction of thoracic region: Secondary | ICD-10-CM | POA: Diagnosis not present

## 2019-03-10 DIAGNOSIS — M9901 Segmental and somatic dysfunction of cervical region: Secondary | ICD-10-CM | POA: Diagnosis not present

## 2019-03-11 ENCOUNTER — Other Ambulatory Visit: Payer: PPO

## 2019-03-14 DIAGNOSIS — M5033 Other cervical disc degeneration, cervicothoracic region: Secondary | ICD-10-CM | POA: Diagnosis not present

## 2019-03-14 DIAGNOSIS — M9901 Segmental and somatic dysfunction of cervical region: Secondary | ICD-10-CM | POA: Diagnosis not present

## 2019-03-14 DIAGNOSIS — M9902 Segmental and somatic dysfunction of thoracic region: Secondary | ICD-10-CM | POA: Diagnosis not present

## 2019-03-14 DIAGNOSIS — M62830 Muscle spasm of back: Secondary | ICD-10-CM | POA: Diagnosis not present

## 2019-03-16 ENCOUNTER — Ambulatory Visit: Payer: PPO | Admitting: Internal Medicine

## 2019-03-17 ENCOUNTER — Ambulatory Visit (INDEPENDENT_AMBULATORY_CARE_PROVIDER_SITE_OTHER): Payer: PPO | Admitting: Family Medicine

## 2019-03-17 ENCOUNTER — Encounter: Payer: Self-pay | Admitting: Family Medicine

## 2019-03-17 ENCOUNTER — Other Ambulatory Visit: Payer: Self-pay

## 2019-03-17 VITALS — BP 124/70 | HR 72 | Temp 97.9°F | Ht 68.0 in | Wt 209.6 lb

## 2019-03-17 DIAGNOSIS — R0989 Other specified symptoms and signs involving the circulatory and respiratory systems: Secondary | ICD-10-CM

## 2019-03-17 DIAGNOSIS — M5442 Lumbago with sciatica, left side: Secondary | ICD-10-CM | POA: Diagnosis not present

## 2019-03-17 DIAGNOSIS — Z23 Encounter for immunization: Secondary | ICD-10-CM

## 2019-03-17 DIAGNOSIS — G44229 Chronic tension-type headache, not intractable: Secondary | ICD-10-CM | POA: Diagnosis not present

## 2019-03-17 DIAGNOSIS — E782 Mixed hyperlipidemia: Secondary | ICD-10-CM | POA: Diagnosis not present

## 2019-03-17 DIAGNOSIS — I771 Stricture of artery: Secondary | ICD-10-CM | POA: Diagnosis not present

## 2019-03-17 DIAGNOSIS — I739 Peripheral vascular disease, unspecified: Secondary | ICD-10-CM

## 2019-03-17 DIAGNOSIS — Z7189 Other specified counseling: Secondary | ICD-10-CM | POA: Diagnosis not present

## 2019-03-17 DIAGNOSIS — I70213 Atherosclerosis of native arteries of extremities with intermittent claudication, bilateral legs: Secondary | ICD-10-CM

## 2019-03-17 DIAGNOSIS — F5101 Primary insomnia: Secondary | ICD-10-CM | POA: Diagnosis not present

## 2019-03-17 DIAGNOSIS — N401 Enlarged prostate with lower urinary tract symptoms: Secondary | ICD-10-CM

## 2019-03-17 DIAGNOSIS — N3943 Post-void dribbling: Secondary | ICD-10-CM

## 2019-03-17 DIAGNOSIS — L299 Pruritus, unspecified: Secondary | ICD-10-CM

## 2019-03-17 DIAGNOSIS — G8929 Other chronic pain: Secondary | ICD-10-CM

## 2019-03-17 DIAGNOSIS — Z951 Presence of aortocoronary bypass graft: Secondary | ICD-10-CM | POA: Diagnosis not present

## 2019-03-17 DIAGNOSIS — Z0000 Encounter for general adult medical examination without abnormal findings: Secondary | ICD-10-CM | POA: Diagnosis not present

## 2019-03-17 NOTE — Patient Instructions (Addendum)
Flu shot today At your convenience, bring [place] copy of your living will.  You are doing well today Stop nortriptyline. Trial flexeril muscle relaxant 10mg  1/2-1 tablet as needed, caution with sedation. This can help back pain and hopefully help you sleep.  Return in 1-2 months for follow up on back and sleep.   Health Maintenance After Age 77 After age 14, you are at a higher risk for certain long-term diseases and infections as well as injuries from falls. Falls are a major cause of broken bones and head injuries in people who are older than age 23. Getting regular preventive care can help to keep you healthy and well. Preventive care includes getting regular testing and making lifestyle changes as recommended by your health care provider. Talk with your health care provider about:  Which screenings and tests you should have. A screening is a test that checks for a disease when you have no symptoms.  A diet and exercise plan that is right for you. What should I know about screenings and tests to prevent falls? Screening and testing are the best ways to find a health problem early. Early diagnosis and treatment give you the best chance of managing medical conditions that are common after age 22. Certain conditions and lifestyle choices may make you more likely to have a fall. Your health care provider may recommend:  Regular vision checks. Poor vision and conditions such as cataracts can make you more likely to have a fall. If you wear glasses, make sure to get your prescription updated if your vision changes.  Medicine review. Work with your health care provider to regularly review all of the medicines you are taking, including over-the-counter medicines. Ask your health care provider about any side effects that may make you more likely to have a fall. Tell your health care provider if any medicines that you take make you feel dizzy or sleepy.  Osteoporosis screening. Osteoporosis is a condition  that causes the bones to get weaker. This can make the bones weak and cause them to break more easily.  Blood pressure screening. Blood pressure changes and medicines to control blood pressure can make you feel dizzy.  Strength and balance checks. Your health care provider may recommend certain tests to check your strength and balance while standing, walking, or changing positions.  Foot health exam. Foot pain and numbness, as well as not wearing proper footwear, can make you more likely to have a fall.  Depression screening. You may be more likely to have a fall if you have a fear of falling, feel emotionally low, or feel unable to do activities that you used to do.  Alcohol use screening. Using too much alcohol can affect your balance and may make you more likely to have a fall. What actions can I take to lower my risk of falls? General instructions  Talk with your health care provider about your risks for falling. Tell your health care provider if: ? You fall. Be sure to tell your health care provider about all falls, even ones that seem minor. ? You feel dizzy, sleepy, or off-balance.  Take over-the-counter and prescription medicines only as told by your health care provider. These include any supplements.  Eat a healthy diet and maintain a healthy weight. A healthy diet includes low-fat dairy products, low-fat (lean) meats, and fiber from whole grains, beans, and lots of fruits and vegetables. Home safety  Remove any tripping hazards, such as rugs, cords, and clutter.  Install  safety equipment such as grab bars in bathrooms and safety rails on stairs.  Keep rooms and walkways well-lit. Activity   Follow a regular exercise program to stay fit. This will help you maintain your balance. Ask your health care provider what types of exercise are appropriate for you.  If you need a cane or walker, use it as recommended by your health care provider.  Wear supportive shoes that have  nonskid soles. Lifestyle  Do not drink alcohol if your health care provider tells you not to drink.  If you drink alcohol, limit how much you have: ? 0-1 drink a day for women. ? 0-2 drinks a day for men.  Be aware of how much alcohol is in your drink. In the [place] one drink equals one typical bottle of beer (12 oz), one-half glass of wine (5 oz), or one shot of hard liquor (1 oz).  Do not use any products that contain nicotine or tobacco, such as cigarettes and e-cigarettes. If you need help quitting, ask your health care provider. Summary  Having a healthy lifestyle and getting preventive care can help to protect your health and wellness after age 66.  Screening and testing are the best way to find a health problem early and help you avoid having a fall. Early diagnosis and treatment give you the best chance for managing medical conditions that are more common for people who are older than age 50.  Falls are a major cause of broken bones and head injuries in people who are older than age 14. Take precautions to prevent a fall at home.  Work with your health care provider to learn what changes you can make to improve your health and wellness and to prevent falls. This information is not intended to replace advice given to you by your health care provider. Make sure you discuss any questions you have with your health care provider. Document Released: 04/08/2017 Document Revised: 09/16/2018 Document Reviewed: 04/08/2017 Elsevier Patient Education  2020 [employer].

## 2019-03-17 NOTE — Progress Notes (Signed)
This visit was conducted in person.  BP 124/70 (BP Location: Left Arm, Patient Position: Sitting, Cuff Size: Normal)   Pulse 72   Temp 97.9 F (36.6 C) (Temporal)   Ht 5\' 8"  (1.727 m)   Wt 209 lb 9 oz (95.1 kg)   SpO2 93%   BMI 31.86 kg/m    CC: CPE Subjective:    Patient ID: Randall Lofts., male    DOB: 1941/05/07, 78 y.o.   MRN: [id number]  HPI: Randall Devonshire. is a 78 y.o. male presenting on 03/17/2019 for Annual Exam (Prt 2.)   Saw health advisor 01/2019 for medicare wellness visit. Note reviewed.   No exam data present    Clinical Support from 01/24/2019 in [place] at [facility] Total Score  0      Fall Risk  03/03/2019 01/24/2019 04/20/2018 08/24/2017 07/14/2016  Falls in the past year? 0 0 0 No No  Number falls in past yr: 0 - - - -  Injury with Fall? 0 - - - -  Risk for fall due to : - Medication side effect - - -  Follow up - Falls evaluation completed;Falls prevention discussed Falls evaluation completed - -      Ongoing headaches - to see eye doctor tomorrow. Saw Dr Loretta Plume - thought chronic tension type headache with [facility] component due to tramadol use - advised stop tramadol, started on nortriptyline 10mg  without benefit. Wants to restart tramadol.  Has had surgeries for symptomatic PAD (balloon angioplasty and stent placement to R and L common iliac arteries). Upcoming repeat leg surgery 04/20/2019 - has been recommended open/hybrid vascular procedure for ongoing leg symptoms (R fem endarterectomy with stenting of SFA/pop artery). Continues aspirin/plavix.  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  Prostate cancer screening -PSA stable.  Lung cancer screening -6 PY hx, quit 2001, not eligible Flu shot -yearly  Tdap 2016 Pneumovax 2013, prevnar 2016 zostavax 2012 shingrix - completed 2019 Advanced directive discussion -has completed this at home.  HCPOA would be daughter. Asked to bring [place] a copy.  Seat belt use discussed Sunscreen usediscussed. No changing moles on skin. Sees derm q3 mo.  Ex smoker - 80 PY hx, quit 2001  Alcohol - limited on weekends  Dentist q6 mo - saw this morning Eye exam yearly  Bowel - occasional constipation treated with dulcolax and MOM.  Bladder - no incontinence   Singled; lives with son and dog  Occ: retired, back part time at [employer]  Activity: gym 4-5d/wk Diet: good water, fruits/vegetables daily     Relevant past medical, surgical, family and social history reviewed and updated as indicated. Interim medical history since our last visit reviewed. Allergies and medications reviewed and updated. Outpatient Medications Prior to Visit  Medication Sig Dispense Refill  . Ascorbic Acid (VITAMIN C) 1000 MG tablet Take 1,000 mg by mouth daily.    [name] aspirin 81 MG EC tablet Take 81 mg by mouth daily.      . Calcium-Magnesium-Vitamin D (CALCIUM 1200+D3 PO) Take 1 tablet by mouth daily.    . Cholecalciferol (VITAMIN D3) 50 MCG (2000 UT) TABS Take 2,000 Units by mouth daily.    . clopidogrel (PLAVIX) 75 MG tablet Take 1 tablet (75 mg total) by mouth daily. (Patient taking differently: Take 75 mg by mouth at bedtime. ) 30 tablet 5  . ibuprofen (ADVIL,MOTRIN) 200 MG tablet Take 400  mg by mouth every 6 (six) hours as needed for headache or moderate pain.     [name] lisinopril (ZESTRIL) 5 MG tablet TAKE 1 TABLET BY MOUTH EVERY DAY (Patient taking differently: Take 5 mg by mouth every evening. ) 90 tablet 1  . metoprolol succinate (TOPROL-XL) 25 MG 24 hr tablet TAKE 1/2 TABLET BY MOUTH EVERY DAY (Patient taking differently: Take 12.5 mg by mouth at bedtime. ) 45 tablet 0  . nitroGLYCERIN (NITROSTAT) 0.4 MG SL tablet Place 1 tablet (0.4 mg total) under the tongue every 5 (five) minutes as needed for chest pain. 25 tablet 3  . NONFORMULARY OR COMPOUNDED ITEM Trimix (30/1/10)-(Pap/Phent/PGE)  Test Dose 3 31ml vials  Qty  #3 Refills 0  [place] [phone] Fax [phone] 3 each 0  . pantoprazole (PROTONIX) 40 MG tablet Take 1 tablet (40 mg total) by mouth daily. (Patient taking differently: Take 40 mg by mouth every evening. ) 30 tablet 11  . Polyethyl Glycol-Propyl Glycol (LUBRICANT EYE DROPS) 0.4-0.3 % SOLN Place 1-2 drops into both eyes 3 (three) times daily as needed (burning eyes.).    [name] simvastatin (ZOCOR) 40 MG tablet Take 1 tablet (40 mg total) by mouth at bedtime. 30 tablet 3  . tamsulosin (FLOMAX) 0.4 MG CAPS capsule TAKE 1 CAPSULE BY MOUTH EVERY DAY AFTER SUPPER (Patient taking differently: Take 0.4 mg by mouth daily. ) 90 capsule 1  . vitamin B-12 (CYANOCOBALAMIN) 500 MCG tablet Take 500 mcg by mouth daily.    . vitamin E 1000 UNIT capsule Take 1,000 Units by mouth daily. Vitamin E 450 mg    . Zinc 50 MG TABS Take 50 mg by mouth daily.    . nortriptyline (PAMELOR) 10 MG capsule Take 1 capsule (10 mg total) by mouth at bedtime. 30 capsule 3  . amoxicillin-clavulanate (AUGMENTIN) 875-125 MG tablet Take 1 tablet by mouth 2 (two) times daily for 10 days. 20 tablet 0   No facility-administered medications prior to visit.      Per HPI unless specifically indicated in ROS section below Review of Systems  Constitutional: Negative for activity change, appetite change, chills, fatigue, fever and unexpected weight change.  HENT: Negative for hearing loss.   Eyes: Negative for visual disturbance.  Respiratory: Negative for cough, chest tightness, shortness of breath and wheezing.   Cardiovascular: Negative for chest pain, palpitations and leg swelling.  Gastrointestinal: Negative for abdominal distention, abdominal pain, blood in stool, constipation, diarrhea, nausea and vomiting.  Genitourinary: Negative for difficulty urinating and hematuria.  Musculoskeletal: Negative for arthralgias, myalgias and neck pain.  Skin: Negative for rash.  Neurological: Positive for headaches. Negative for  dizziness, seizures and syncope.  Hematological: Negative for adenopathy. Does not bruise/bleed easily.  Psychiatric/Behavioral: Negative for dysphoric mood. The patient is not nervous/anxious.    Objective:    BP 124/70 (BP Location: Left Arm, Patient Position: Sitting, Cuff Size: Normal)   Pulse 72   Temp 97.9 F (36.6 C) (Temporal)   Ht 5\' 8"  (1.727 m)   Wt 209 lb 9 oz (95.1 kg)   SpO2 93%   BMI 31.86 kg/m   Wt Readings from Last 3 Encounters:  03/17/19 209 lb 9 oz (95.1 kg)  03/07/19 206 lb (93.4 kg)  03/03/19 208 lb (94.3 kg)    Physical Exam Vitals signs and nursing note reviewed.  Constitutional:      General: He is not in acute distress.    Appearance: Normal appearance. He is well-developed. He is not  ill-appearing.  HENT:     Head: Normocephalic and atraumatic.     Right Ear: Hearing, tympanic membrane, ear canal and external ear normal.     Left Ear: Hearing, tympanic membrane, ear canal and external ear normal.     Nose: Nose normal.     Mouth/Throat:     Mouth: Mucous membranes are moist.     Pharynx: Oropharynx is clear. Uvula midline. No oropharyngeal exudate or posterior oropharyngeal erythema.  Eyes:     General: No scleral icterus.    Extraocular Movements: Extraocular movements intact.     Conjunctiva/sclera: Conjunctivae normal.     Pupils: Pupils are equal, round, and reactive to light.  Neck:     Musculoskeletal: Normal range of motion and neck supple.     Vascular: Carotid bruit (R sided) present.  Cardiovascular:     Rate and Rhythm: Normal rate and regular rhythm.     Pulses: Normal pulses.          Radial pulses are 2+ on the right side and 2+ on the left side.     Heart sounds: Normal heart sounds. No murmur.  Pulmonary:     Effort: Pulmonary effort is normal. No respiratory distress.     Breath sounds: Normal breath sounds. No wheezing, rhonchi or rales.  Abdominal:     General: Abdomen is flat. Bowel sounds are normal. There is no  distension.     Palpations: Abdomen is soft. There is no mass.     Tenderness: There is no abdominal tenderness. There is no guarding or rebound.     Hernia: No hernia is present.  Musculoskeletal: Normal range of motion.     Right lower leg: No edema.     Left lower leg: No edema.  Lymphadenopathy:     Cervical: No cervical adenopathy.  Skin:    General: Skin is warm and dry.     Findings: No rash.  Neurological:     General: No focal deficit present.     Mental Status: He is alert and oriented to person, place, and time.     Comments: CN grossly intact, station and gait intact  Psychiatric:        Mood and Affect: Mood normal.        Behavior: Behavior normal.        Thought Content: Thought content normal.        Judgment: Judgment normal.       Results for orders placed or performed in visit on 03/07/19  Novel Coronavirus, NAA ([facility])   Specimen: Oropharyngeal(OP) collection in vial transport medium   OROPHARYNGEA  TESTING  Result Value Ref Range   SARS-CoV-2, NAA Not Detected Not Detected   Assessment & Plan:   Problem List Items Addressed This Visit    Vascular claudication ([place])    Appreciate VVS care.      Relevant Medications   cyclobenzaprine (FLEXERIL) 10 MG tablet   Subclavian artery stenosis, right (HCC)   S/P CABG x 4   RESOLVED: Pruritic condition    Resolved with rare hydroxyzine      Insomnia   Hyperlipidemia    Given PAD has been recommended higher statin dose - has not started yet.       Health maintenance examination - Primary    Preventative protocols reviewed and updated unless pt declined. Discussed healthy diet and lifestyle.       Chronic headache    Saw neurology, didn't note benefit with 10mg  nortriptyline  and doesn't desire higher dose.  Will trial flexeril PRN headache.  Update with effect, consider restarting tramadol.       Relevant Medications   cyclobenzaprine (FLEXERIL) 10 MG tablet   Chronic back pain   Relevant  Medications   cyclobenzaprine (FLEXERIL) 10 MG tablet   Carotid bruit    Reviewed latest carotid US.       BPH (benign prostatic hyperplasia)    Continues flomax.       Atherosclerosis of native arteries of extremity with intermittent claudication (HCC)   Relevant Medications   cyclobenzaprine (FLEXERIL) 10 MG tablet   Advanced care planning/counseling discussion    Advanced directive discussion -has completed this at home. HCPOA would be daughter. Asked to bring [place] a copy.        Other Visit Diagnoses    Need for influenza vaccination       Relevant Orders   Flu Vaccine QUAD High Dose(Fluad) (Completed)       Meds ordered this encounter  Medications  . cyclobenzaprine (FLEXERIL) 10 MG tablet    Sig: Take 0.5-1 tablets (5-10 mg total) by mouth 2 (two) times daily as needed for muscle spasms.    Dispense:  30 tablet    Refill:  1   Orders Placed This Encounter  Procedures  . Flu Vaccine QUAD High Dose(Fluad)    Patient instructions: Flu shot today At your convenience, bring [place] copy of your living will.  You are doing well today Stop nortriptyline. Trial flexeril muscle relaxant 10mg  1/2-1 tablet as needed, caution with sedation. This can help back pain and hopefully help you sleep.  Return in 1-2 months for follow up on back and sleep.   Follow up plan: Return in about 6 weeks (around 04/28/2019) for follow up visit.  Ria Bush, MD

## 2019-03-17 NOTE — Assessment & Plan Note (Signed)
Advanced directive discussion -has completed this at home. HCPOA would be daughter. Asked to bring [place] a copy.

## 2019-03-17 NOTE — Assessment & Plan Note (Signed)
Preventative protocols reviewed and updated unless pt declined. Discussed healthy diet and lifestyle.  

## 2019-03-18 DIAGNOSIS — H04123 Dry eye syndrome of bilateral lacrimal glands: Secondary | ICD-10-CM | POA: Diagnosis not present

## 2019-03-20 MED ORDER — CYCLOBENZAPRINE HCL 10 MG PO TABS: 5.0000 mg | ORAL_TABLET | Freq: Two times a day (BID) | ORAL | 1 refills | Status: DC | PRN

## 2019-03-20 NOTE — Assessment & Plan Note (Addendum)
Reviewed latest carotid US.

## 2019-03-20 NOTE — Assessment & Plan Note (Signed)
Continues flomax.

## 2019-03-20 NOTE — Assessment & Plan Note (Signed)
Appreciate VVS care.  

## 2019-03-20 NOTE — Assessment & Plan Note (Signed)
Saw neurology, didn't note benefit with 10mg  nortriptyline and doesn't desire higher dose.  Will trial flexeril PRN headache.  Update with effect, consider restarting tramadol.

## 2019-03-20 NOTE — Assessment & Plan Note (Signed)
Resolved with rare hydroxyzine

## 2019-03-20 NOTE — Assessment & Plan Note (Signed)
Given PAD has been recommended higher statin dose - has not started yet.

## 2019-03-22 ENCOUNTER — Telehealth: Payer: Self-pay

## 2019-03-22 NOTE — Telephone Encounter (Signed)
Submitted PA for Flexeril today through cover my meds. Awaiting response.

## 2019-03-23 ENCOUNTER — Ambulatory Visit: Payer: PPO | Admitting: Internal Medicine

## 2019-03-23 NOTE — Telephone Encounter (Signed)
plz notify insurance did not cover flexeril. Would suggest he try muscle relaxant out of pocket if affordable.

## 2019-03-23 NOTE — Telephone Encounter (Signed)
Received faxed PA denial stating the drug is requested for a non-medically accepted indication:  Chronic tension-type headache, not intractable.  Fyi to Dr. Darnell Level.

## 2019-03-24 NOTE — Telephone Encounter (Signed)
Left message on vm per dpr notifying pt of PA denial and relaying Dr. Synthia Innocent message.

## 2019-03-31 DIAGNOSIS — M62830 Muscle spasm of back: Secondary | ICD-10-CM | POA: Diagnosis not present

## 2019-03-31 DIAGNOSIS — M9901 Segmental and somatic dysfunction of cervical region: Secondary | ICD-10-CM | POA: Diagnosis not present

## 2019-03-31 DIAGNOSIS — M5033 Other cervical disc degeneration, cervicothoracic region: Secondary | ICD-10-CM | POA: Diagnosis not present

## 2019-03-31 DIAGNOSIS — M9902 Segmental and somatic dysfunction of thoracic region: Secondary | ICD-10-CM | POA: Diagnosis not present

## 2019-04-06 DIAGNOSIS — M5033 Other cervical disc degeneration, cervicothoracic region: Secondary | ICD-10-CM | POA: Diagnosis not present

## 2019-04-06 DIAGNOSIS — M62830 Muscle spasm of back: Secondary | ICD-10-CM | POA: Diagnosis not present

## 2019-04-06 DIAGNOSIS — M9902 Segmental and somatic dysfunction of thoracic region: Secondary | ICD-10-CM | POA: Diagnosis not present

## 2019-04-06 DIAGNOSIS — M9901 Segmental and somatic dysfunction of cervical region: Secondary | ICD-10-CM | POA: Diagnosis not present

## 2019-04-11 ENCOUNTER — Ambulatory Visit: Payer: PPO | Admitting: Internal Medicine

## 2019-04-11 ENCOUNTER — Other Ambulatory Visit: Payer: Self-pay

## 2019-04-11 ENCOUNTER — Encounter: Payer: Self-pay | Admitting: Internal Medicine

## 2019-04-11 ENCOUNTER — Other Ambulatory Visit (INDEPENDENT_AMBULATORY_CARE_PROVIDER_SITE_OTHER): Payer: Self-pay | Admitting: Nurse Practitioner

## 2019-04-11 VITALS — BP 136/74 | HR 87 | Temp 98.2°F | Ht 68.0 in | Wt 210.6 lb

## 2019-04-11 DIAGNOSIS — G4733 Obstructive sleep apnea (adult) (pediatric): Secondary | ICD-10-CM | POA: Diagnosis not present

## 2019-04-11 NOTE — Patient Instructions (Signed)
Continue BiPAP as prescribed

## 2019-04-11 NOTE — Progress Notes (Signed)
[facility] Consultation      Date: 04/11/2019,   MRN# [id number] Randall Ali 1940-08-18   CHIEF COMPLAINT:  Follow up OSA follo  HISTORY OF PRESENT ILLNESS   Follow-up sleep apnea Currently on BiPAP IPAP 20 EPAP 10  Compliance report reviewed with patient Previous compliance report showed greater than 90% compliance AHI down to 2.4 Minimal leak  Current compliance report is 100% for days and greater than 4 hours AHI is down to 1.1 Minimal leak Tolerating full facemask No significant issues  Patient was evaluated for hernia however no surgical intervention at this time  No evidence of heart failure at this time No evidence or signs of infection at this time No respiratory distress No fevers, chills, nausea, vomiting, diarrhea No evidence of lower extremity edema No evidence hemoptysis  Former smoker 3 to 4 packs a day for 50 years quit in 2001 CABG 2016  Overall doing well on BiPAP therapy    CT chest November 20, 2016 Shows right upper lobe nodule approximately 6.9 mm which has not changed since 2016 Patient has known about this right upper lobe nodule for the last 6 years After further evaluation right upper lobe lung nodule is benign      Current Medication:  Current Outpatient Medications:  .  Ascorbic Acid (VITAMIN C) 1000 MG tablet, Take 1,000 mg by mouth daily., Disp: , Rfl:  .  aspirin 81 MG EC tablet, Take 81 mg by mouth daily.  , Disp: , Rfl:  .  Calcium-Magnesium-Vitamin D (CALCIUM 1200+D3 PO), Take 1 tablet by mouth daily., Disp: , Rfl:  .  Cholecalciferol (VITAMIN D3) 50 MCG (2000 UT) TABS, Take 2,000 Units by mouth daily., Disp: , Rfl:  .  clopidogrel (PLAVIX) 75 MG tablet, Take 1 tablet (75 mg total) by mouth daily. (Patient taking differently: Take 75 mg by mouth at bedtime. ), Disp: 30 tablet, Rfl: 5 .  cyclobenzaprine (FLEXERIL) 10 MG tablet, Take 0.5-1 tablets (5-10 mg total) by mouth 2 (two) times daily as needed  for muscle spasms., Disp: 30 tablet, Rfl: 1 .  ibuprofen (ADVIL,MOTRIN) 200 MG tablet, Take 400 mg by mouth every 6 (six) hours as needed for headache or moderate pain. , Disp: , Rfl:  .  lisinopril (ZESTRIL) 5 MG tablet, TAKE 1 TABLET BY MOUTH EVERY DAY (Patient taking differently: Take 5 mg by mouth every evening. ), Disp: 90 tablet, Rfl: 1 .  metoprolol succinate (TOPROL-XL) 25 MG 24 hr tablet, TAKE 1/2 TABLET BY MOUTH EVERY DAY (Patient taking differently: Take 12.5 mg by mouth at bedtime. ), Disp: 45 tablet, Rfl: 0 .  nitroGLYCERIN (NITROSTAT) 0.4 MG SL tablet, Place 1 tablet (0.4 mg total) under the tongue every 5 (five) minutes as needed for chest pain., Disp: 25 tablet, Rfl: 3 .  NONFORMULARY OR COMPOUNDED ITEM, Trimix (30/1/10)-(Pap/Phent/PGE)  Test Dose 3 62ml vials  Qty #3 [place] [phone] Fax [phone], Disp: 3 each, Rfl: 0 .  pantoprazole (PROTONIX) 40 MG tablet, Take 1 tablet (40 mg total) by mouth daily. (Patient taking differently: Take 40 mg by mouth every evening. ), Disp: 30 tablet, Rfl: 11 .  Polyethyl Glycol-Propyl Glycol (LUBRICANT EYE DROPS) 0.4-0.3 % SOLN, Place 1-2 drops into both eyes 3 (three) times daily as needed (burning eyes.)., Disp: , Rfl:  .  simvastatin (ZOCOR) 40 MG tablet, Take 1 tablet (40 mg total) by mouth at bedtime., Disp: 30 tablet, Rfl: 3 .  tamsulosin (  FLOMAX) 0.4 MG CAPS capsule, TAKE 1 CAPSULE BY MOUTH EVERY DAY AFTER SUPPER (Patient taking differently: Take 0.4 mg by mouth daily. ), Disp: 90 capsule, Rfl: 1 .  vitamin B-12 (CYANOCOBALAMIN) 500 MCG tablet, Take 500 mcg by mouth daily., Disp: , Rfl:  .  vitamin E 1000 UNIT capsule, Take 1,000 Units by mouth daily. Vitamin E 450 mg, Disp: , Rfl:  .  Zinc 50 MG TABS, Take 50 mg by mouth daily., Disp: , Rfl:     ALLERGIES   Patient has no known allergies.     REVIEW OF SYSTEMS   Review of Systems  Constitutional: Positive for malaise/fatigue. Negative for chills,  diaphoresis, fever and weight loss.  HENT: Negative for congestion and hearing loss.   Respiratory: Negative for cough, hemoptysis, sputum production, shortness of breath and wheezing.   Cardiovascular: Negative for chest pain, palpitations and orthopnea.  Gastrointestinal: Negative for heartburn.  Skin: Negative for rash.  Neurological: Negative for weakness.  All other systems reviewed and are negative.  .vs BP 136/74 (BP Location: Left Arm, Cuff Size: Normal)   Pulse 87   Temp 98.2 F (36.8 C) (Temporal)   Ht 5\' 8"  (1.727 m)   Wt 210 lb 9.6 oz (95.5 kg)   SpO2 97%   BMI 32.02 kg/m    PHYSICAL EXAM  Physical Exam  Constitutional: He is oriented to person, place, and time. He appears well-developed and well-nourished. No distress.  Eyes: No scleral icterus.  Neck: Neck supple.  Cardiovascular: Normal rate, regular rhythm and normal heart sounds.  No murmur heard. Pulmonary/Chest: Effort normal and breath sounds normal. No stridor. No respiratory distress. He has no wheezes.  Musculoskeletal: Normal range of motion.        General: No edema.  Neurological: He is alert and oriented to person, place, and time. No cranial nerve deficit.  Skin: Skin is warm.  Psychiatric: He has a normal mood and affect.        IMAGING    CT chest November 20, 2016 I have Independently reviewed images of  CT chest   Interpretation:6.9 MM RUL nodule noted Has not change in size of the last 2 years    ASSESSMENT/PLAN   78 year old pleasant white male seen today for follow-up sleep apnea History of right upper lobe lung nodule which is benign Setting of obesity and deconditioned state  OSA Patient is using and benefiting from BiPAP therapy nightly No issues with the mask at this time Wears full facemask BiPAP 20/10 Excellent compliance report at this time  Right upper lobe lung nodule No change in size for the last 2 years No further CT chest at this time  Obesity -recommend  significant weight loss -recommend changing diet  Deconditioned state -Recommend increased daily activity and exercise   COVID-19 EDUCATION: The signs and symptoms of COVID-19 were discussed with the patient and how to seek care for testing.  The importance of social distancing was discussed today. Hand Washing Techniques and avoid touching face was advised.     MEDICATION ADJUSTMENTS/LABS AND TESTS ORDERED: Continue BiPAP as prescribed   CURRENT MEDICATIONS REVIEWED AT LENGTH WITH PATIENT TODAY   Patient satisfied with Plan of action and management. All questions answered  Follow up in 1 year   Emannuel Vise [name], M.D.  [facility]  Medical Director [place] Director [facility] Cardio-Pulmonary Department

## 2019-04-12 ENCOUNTER — Encounter
Admission: RE | Admit: 2019-04-12 | Discharge: 2019-04-12 | Disposition: A | Discharge status: Home / Self Care | Payer: PPO | Source: Ambulatory Visit | Attending: Vascular Surgery | Admitting: Vascular Surgery

## 2019-04-12 DIAGNOSIS — Z7902 Long term (current) use of antithrombotics/antiplatelets: Secondary | ICD-10-CM | POA: Diagnosis not present

## 2019-04-12 DIAGNOSIS — Z01812 Encounter for preprocedural laboratory examination: Secondary | ICD-10-CM | POA: Diagnosis not present

## 2019-04-12 DIAGNOSIS — G4733 Obstructive sleep apnea (adult) (pediatric): Secondary | ICD-10-CM | POA: Insufficient documentation

## 2019-04-12 DIAGNOSIS — Z7982 Long term (current) use of aspirin: Secondary | ICD-10-CM | POA: Diagnosis not present

## 2019-04-12 DIAGNOSIS — E785 Hyperlipidemia, unspecified: Secondary | ICD-10-CM | POA: Insufficient documentation

## 2019-04-12 DIAGNOSIS — Z79899 Other long term (current) drug therapy: Secondary | ICD-10-CM | POA: Insufficient documentation

## 2019-04-12 HISTORY — DX: Personal history of other diseases of the digestive system: Z87.19

## 2019-04-12 LAB — BASIC METABOLIC PANEL
Anion gap: 9 (ref 5–15)
BUN: 22 mg/dL (ref 8–23)
CO2: 25 mmol/L (ref 22–32)
Calcium: 9.8 mg/dL (ref 8.9–10.3)
Chloride: 104 mmol/L (ref 98–111)
Creatinine, Ser: 1.01 mg/dL (ref 0.61–1.24)
GFR calc Af Amer: >60 mL/min (ref >60)
GFR calc non Af Amer: >60 mL/min (ref >60)
Glucose, Bld: 98 mg/dL (ref 70–99)
Potassium: 4.4 mmol/L (ref 3.5–5.1)
Sodium: 138 mmol/L (ref 135–145)

## 2019-04-12 LAB — CBC WITH DIFFERENTIAL/PLATELET
Abs Immature Granulocytes: 0.05 10*3/uL (ref 0.00–0.07)
Basophils Absolute: 0.1 10*3/uL (ref 0.0–0.1)
Basophils Relative: 1 %
Eosinophils Absolute: 0.1 10*3/uL (ref 0.0–0.5)
Eosinophils Relative: 1 %
HCT: 46.4 % (ref 39.0–52.0)
Hemoglobin: 15.5 g/dL (ref 13.0–17.0)
Immature Granulocytes: 1 %
Lymphocytes Relative: 23 %
Lymphs Abs: 1.6 10*3/uL (ref 0.7–4.0)
MCH: 30.1 pg (ref 26.0–34.0)
MCHC: 33.4 g/dL (ref 30.0–36.0)
MCV: 90.1 fL (ref 80.0–100.0)
Monocytes Absolute: 0.9 10*3/uL (ref 0.1–1.0)
Monocytes Relative: 13 %
Neutro Abs: 4.3 10*3/uL (ref 1.7–7.7)
Neutrophils Relative %: 61 %
Platelets: 219 10*3/uL (ref 150–400)
RBC: 5.15 MIL/uL (ref 4.22–5.81)
RDW: 12.4 % (ref 11.5–15.5)
WBC: 6.9 10*3/uL (ref 4.0–10.5)
nRBC: 0 % (ref 0.0–0.2)

## 2019-04-12 LAB — PROTIME-INR
INR: 1.1 (ref 0.8–1.2)
Prothrombin Time: 13.7 seconds (ref 11.4–15.2)

## 2019-04-12 LAB — APTT: aPTT: 36 seconds (ref 24–36)

## 2019-04-12 LAB — TYPE AND SCREEN
ABO/RH(D): A NEG
Antibody Screen: NEGATIVE

## 2019-04-12 NOTE — Pre-Procedure Instructions (Signed)
Rise Mu, PA-C  Physician Assistant Certified  Cardiology     Progress Notes  Signed     Encounter Date:  02/11/2019                  Signed          Expand All Collapse All            Expand widget buttonCollapse widget button    Show:Clear all   ManualTemplateCopied  Added by:     Rise Mu, PA-C   Hover for detailscustomization button                                                                                                                                                                                                 untitled image      Cardiology Office Note        Date:  02/11/2019      ID:  Randall Ali., DOB 10-14-1940, MRN [id number]     PCP:  Ria Bush, MD     Cardiologist:  [name], MD   Electrophysiologist:  None      Chief Complaint: Preprocedure cardiac evaluation      History of Present Illness:       Braydn Carneiro. is a 78 y.o. male with history of CAD with inferior MI in 2001 status post PCI/DES to the RCA at that time status post four-vessel CABG on 01/24/2015 with postoperative A. fib, carotid artery disease, extensive PAD as outlined below, HTN, HLD, GERD with EGD in 07/2014 showing severe esophagitis, chronic back pain, ED, obesity, and OSA on CPAP who presents for preprocedure cardiac evaluation.     Admitted in 2011 for chest pain and diaphoresis. Stress test showed no ischemia with scar. Repeat admission in 2013 with worsening, exertional chest pain with LHC showing moderate OM disease, otherwise no significant stenosis. Recurrent chest pain in spring of 2016 with nuclear stress test negative for ischemia, EF 54%, low risk study. He continued to note chest pain leading to a LHC  in 01/2015 that showed severe 3-vessel CAD.  Given multivessel CAD, he underwent four-vessel CABG on 01/24/2015 (LIMA to LAD, SVG to OM1, SVG to OM2, SVG to rPDA).  Postoperative course was complicated by postop A. fib, delirium, and wound infection from sternal wound with cultures growing Enterobacter aerogenes.  He was seen and late 2018 with chest pain and underwent diagnostic cath in 06/2017 which showed severe three-vessel CAD including an occluded SVG to RCA.  The patient had a patent LIMA to LAD as well  as patent SVG to OM1 and OM2.  Medical management was recommended with deferment of intervention being reserved for refractory angina.  Echo on 06/18/2017 showed an EF of 55 to 60%, normal wall motion, grade 1 diastolic dysfunction, mild mitral regurgitation, mildly dilated left atrium.     He was most recently seen in the office in 06/2018 and denied any significant shortness of breath or chest pain.  He was compliant with medications.  Given his significant atherosclerotic disease initiation of Xarelto 2.5 mg was discussed with the patient, which was declined.  Patient contacted our office in 10/2018 after getting hearing aids with report of he could "hear his heartbeat."  Labs were unrevealing at that time as outlined below.  Follow-up EKG showed a sinus rhythm with rare PVC.     More recently, the patient was seen by vascular surgery for claudication.  Noninvasive imaging in 6 and 12/2018 showed showed a right side ABI of 0.83 and left side of 0.75.  As well as calcified plaque seen in the aorta with the largest portion of the aorta measuring 2 cm, common iliac arteries, and external iliac arteries and 40 to 59% stenosis in the right ICA with 1 to 39% stenosis in the left ICA with bilateral vertebral arteries demonstrating antegrade flow and normal flow hemodynamics in the bilateral subclavian veins.  Given worsening claudication-like symptoms the patient underwent lower extremity angiography on 01/04/2019  with kissing balloon angioplasty and stent placement to the right common iliac artery, kissing balloon angioplasty and stent placement to the left common iliac artery, and balloon angioplasty to the bilateral external iliac arteries.  Follow-up ABIs on 01/17/2019 were improved with a right sided ABI of 0.93 and left-sided ABI of 1.02.  The patient continued to experience discomfort along the right lower extremity leading to repeat lower extremity angiography on 01/25/2019 which showed his disease was not ideal for nonsurgical intervention.  The patient was seen in follow-up by vascular surgeon on 01/31/2019 with continued symptoms as well as ulceration and tissue loss of the right foot.  Given this, open surgical repair was recommended with a hybrid procedure as noted in the vascular surgery note.     Patient comes in doing very well from a cardiac perspective.  He denies any chest pain, shortness of breath, palpitations, dizziness, presyncope, or syncope.  No lower extremity swelling, abdominal distention, orthopnea, PND, early satiety.  He continues to exercise at a local gym 5 to 6 days/week for a total of 45 minutes to 1 hour and 15 minutes working on [employer] and stationary bike.  With this, he does not have any symptoms concerning for angina.  Since having lower extremity intervention on his left lower extremity he has done remarkably well.  His main limiting factor is claudication affecting the right lower extremity for which he is planning to undergo surgical intervention at a date to be determined based on the birth of his grandson.  He denies any falls, BRBPR, or melena.  Blood pressure typically runs in the 1 teens to 120s over 60s to 70s.  He is tolerating dual antiplatelet therapy without issues.  He has not needed any sublingual nitroglycerin in 1 to 2 years.        Labs:  01/2019 - serum creatinine 1.03  12/2018 - total cholesterol 150, triglycerides 76, HDL 43, LDL 56, potassium 4.8, AST/ALT  normal, albumin 4.1, Hgb 14.7, PLT 170  10/2018 - magnesium 1.9, TSH normal  Past Medical History:    Diagnosis   Date    .   Allergy            seasonal    .   Arthritis            all over- in general     .   CAD (coronary artery disease)            a. inferior wall MI 10/01 s/p PCI/DES to RCA; b. Myoview 3/16 neg for ischemia; c. LHC 8/16: ostLAD 80%, OM1 70%, OM2 70% x 2 lesions, mRCA 30%, dRCA 70% s/p 4-V CABG 01/24/15 (LIMA-LAD, VG- OM1, VG-OM2, VG-PDA)     .   Cancer (HCC)            skin, melanoma    .   Carotid artery disease ([place])            a. [place] 8/16: 1-39% bilateral ICA stenosis    .   Cataract            removed    .   Diastolic dysfunction            a. TTE 8/16: EF 55-60%, no RWMA, Gr1DD, calcified mitral annulus, mild biatrial enlargement    .   Erectile dysfunction        .   History of elbow surgery        .   HLD (hyperlipidemia)        .   HTN (hypertension)        .   Inferior myocardial infarction ([place])   10/01        stent RCA    .   Postoperative wound infection   02/02/2015    .   Reflux esophagitis        .   Sleep apnea   2017        CPAP at night                Past Surgical History:    Procedure   Laterality   Date    .   arm surgery       2010    .   CARDIAC CATHETERIZATION       06/24/11    .   CARDIAC CATHETERIZATION   N/A   01/18/2015        Procedure: Left Heart Cath with coronary angiography;  Surgeon: Minna Merritts, MD;  Location: [facility];  Service: Cardiovascular;  Laterality: N/A;    .   CARDIAC CATHETERIZATION   N/A   01/18/2015        Procedure: Intravascular Pressure Wire/FFR Study;  Surgeon: [name], MD;  Location: [facility];  Service: Cardiovascular;  Laterality: N/A;    .   CAROTID STENT       03/10/2011     .   COLONOSCOPY       2010    .   COLONOSCOPY       06/14/2014        Dr Hilarie Fredrickson    .   CORONARY ARTERY BYPASS GRAFT   N/A   01/24/2015        Procedure: CORONARY ARTERY BYPASS GRAFTING x 4 (LIMA-LAD, SVG-Int 1- Int 2, SVG-PD) ENDOSCOPIC GREATER SAPHENOUS VEIN HARVEST LEFT LEG;  Surgeon: [name], MD;  Location: [facility];  Service: Open Heart Surgery;  Laterality: N/A;    .  ESOPHAGOGASTRODUODENOSCOPY (EGD) WITH PROPOFOL   N/A   04/24/2016        Procedure: ESOPHAGOGASTRODUODENOSCOPY (EGD) WITH PROPOFOL;  Surgeon: Jerene Bears, MD;  Location: [facility];  Service: Gastroenterology;  Laterality: N/A;    .   EYE SURGERY                lasik 15 yrs. ago, cataracts removed - both eyes     .   HAMMER TOE SURGERY                right toe    .   LEFT HEART CATH AND CORONARY ANGIOGRAPHY   Left   06/10/2017        Procedure: LEFT HEART CATH AND CORONARY ANGIOGRAPHY;  Surgeon: Minna Merritts, MD;  Location: [facility];  Service: Cardiovascular;  Laterality: Left;    .   LOWER EXTREMITY ANGIOGRAPHY   Left   01/04/2019        Procedure: LOWER EXTREMITY ANGIOGRAPHY;  Surgeon: Katha Cabal, MD;  Location: [facility];  Service: Cardiovascular;  Laterality: Left;    .   LOWER EXTREMITY ANGIOGRAPHY   Right   01/25/2019        Procedure: LOWER EXTREMITY ANGIOGRAPHY;  Surgeon: Katha Cabal, MD;  Location: [facility];  Service: Cardiovascular;  Laterality: Right;    .   NASAL SINUS SURGERY       2008        septpolasty, bilateral turbinate reduction    .   SHOULDER ARTHROSCOPY       2012    .   TEE WITHOUT CARDIOVERSION   N/A   01/24/2015        Procedure: TRANSESOPHAGEAL ECHOCARDIOGRAM (TEE);  Surgeon: [name], MD;  Location: [facility];  Service: Open Heart Surgery;  Laterality: N/A;    .   [place]     .   UPPER GI ENDOSCOPY       07/2014, 04-24-16        Dr Raquel James    .   WRIST SURGERY       2011          Current Medications:  Active Medications  Allergies:   Patient has no known allergies.       Social History             Socioeconomic History    .   Marital status:   Single            Spouse name:   Not on file    .   Number of children:   Not on file    .   Years of education:   32    .   Highest education level:   Not on file    Occupational History    .   Occupation:   retired            Comment: Agricultural consultant    Social Needs    .   Financial resource strain:   Not hard at all    .   Food insecurity            Worry:   Never true            Inability:   Never true    .   Transportation needs            Medical:   No            Non-medical:   No    Tobacco Use    .   Smoking status:   Former Smoker            Packs/day:   2.50            Years:   40.00            Pack years:   100.00            Types:   Cigarettes            Quit date:   03/24/2000            Years since quitting:   18.8    .   Smokeless tobacco:   Never Used    Substance and Sexual Activity    .   Alcohol use:   Yes            Alcohol/week:   5.0 standard drinks            Types:   5 Shots of liquor per week    .   Drug use:   No    .   Sexual activity:   Yes    Lifestyle    .   Physical activity             Days per week:   5 days            Minutes per session:   60 min    .   Stress:   Not at all    Relationships    .   Social Airline pilot on phone:   Not on file            Gets together:   Not on file            Attends religious service:   Not on file            Active member of club or organization:   Not on file            Attends meetings of clubs or organizations:   Not on file  Relationship status:   Not on file    Other Topics   Concern    .   Not on file    Social History Narrative        Singled; lives with son and dog         Occ: retired, back part time at [employer];         Activity: gym 4-5d/wk        Diet: good water, fruits/vegetables daily        Caffeine Use-yes          Family History:   [name] family history includes Cancer in his paternal grandfather; Diabetes in his father; Heart disease in his mother; Heart disease (age of onset: 67) in his brother; Hypertension in his father and mother. There is no history of Colon cancer, Prostate cancer, Bladder Cancer, or Kidney cancer.     ROS:    Review of Systems   Constitutional: Negative for chills, diaphoresis, fever, malaise/fatigue and weight loss.   HENT: Negative for congestion.    Eyes: Negative for discharge and redness.   Respiratory: Negative for cough, hemoptysis, sputum production, shortness of breath and wheezing.    Cardiovascular: Positive for claudication. Negative for chest pain, palpitations, orthopnea, leg swelling and PND.   Gastrointestinal: Negative for abdominal pain, blood in stool, heartburn, melena, nausea and vomiting.   Genitourinary: Negative for hematuria.   Musculoskeletal: Negative for falls and myalgias.   Skin: Negative for rash.   Neurological: Negative for dizziness, tingling, tremors, sensory change, speech change,  focal weakness, loss of consciousness and weakness.   Endo/Heme/Allergies: Does not bruise/bleed easily.   Psychiatric/Behavioral: Negative for substance abuse. The patient is not nervous/anxious.    All other systems reviewed and are negative.            EKGs/Labs/Other Studies Reviewed:        Studies reviewed were summarized above. The additional studies were reviewed today:     2D Echo 06/2017:  - Left ventricle: The cavity size was normal. Systolic function was    normal. The estimated ejection fraction was in the range of 55%    to 60%. Wall motion was normal; there were no regional wall    motion abnormalities. Doppler parameters are consistent with    abnormal left ventricular relaxation (grade 1 diastolic    dysfunction).  - Mitral valve: Mildly calcified annulus. There was mild    regurgitation.  - Left atrium: The atrium was mildly dilated.  - Pulmonary arteries: Systolic pressure could not be accurately    estimated.  __________     [facility] 06/2017:  Left mainstem:   Large vessel that bifurcates into the LAD and left circumflex, no Mild diffuse disease  Left anterior descending (LAD):   Moderate sized vessel that extends to the apical region, diagonal branch 2 of small to moderate size, severe proximal and mid disease. Patent [place] to the LAD is patent  Left circumflex (LCx):  Moderate vessel with OM branch 2, Severe OM1 and OM2 disease, patent SVG to OM! Jump graft to OM2, patent with no significant disease  Right coronary artery (RCA):  Right dominant vessel moderate size with PL and PDA, severe proximal, mid and disease disease. SVG to PDA is occluded ostially  Left ventriculography: Left ventricular systolic function is normal, mild inferior wall hypokinesis, LVEF is estimated at 55%, there is no significant mitral regurgitation , no significant aortic valve stenosis  Final Conclusions:   Severe  three vessel disease, occluded SVG to the  RCA, Patent SVG to OM1 and OM2, patent LIMA to the LAD  Recommendations:  New occluded RCA Discussed with him in detail Will try medical management first Add plavix, nitrates If he requests intervention, will have to be performed in [place] with atherectomy        EKG:  EKG is ordered today.  The EKG ordered today demonstrates NSR, 74 bpm, prior inferior infarct, no acute ST-T changes, grossly unchanged from prior     Recent Labs:  10/27/2018: Magnesium 1.9; TSH 1.565  01/06/2019: ALT 14; Hemoglobin 14.7; Platelets 170.0; Potassium 4.8; Sodium 140  01/25/2019: BUN 25; Creatinine, Ser 1.03   Recent Lipid Panel  Labs (Brief)                                                                                                                           PHYSICAL EXAM:        VS:  BP 128/80 (BP Location: Left Arm, Patient Position: Sitting, Cuff Size: Normal)   Pulse 76   Ht 5\' 8"  (1.727 m)   Wt 212 lb (96.2 kg)   SpO2 96%   BMI 32.23 kg/m   BMI: Body mass index is 32.23 kg/m.     Physical Exam   Constitutional: He is oriented to person, place, and time. He appears well-developed and well-nourished.   HENT:   Head: Normocephalic and atraumatic.   Eyes: Right eye exhibits no discharge. Left eye exhibits no discharge.   Neck: Normal range of motion. No JVD present.   Cardiovascular: Normal rate, regular rhythm, S1 normal, S2 normal and normal heart sounds. Exam reveals no distant heart sounds, no friction rub, no midsystolic click and no opening snap.   No murmur heard.  Pulses:       Dorsalis pedis pulses are 0 on the right side and 1+ on the left side.   Pulmonary/Chest: Effort normal and breath sounds normal. No respiratory distress. He has no decreased breath sounds. He has no wheezes. He has no rales. He exhibits no tenderness.   Abdominal: Soft. He exhibits no distension. There is no  abdominal tenderness.   Musculoskeletal:         General: No edema.  Neurological: He is alert and oriented to person, place, and time.   Skin: Skin is warm and dry. No cyanosis. Nails show no clubbing.  Psychiatric: He has a normal mood and affect. His speech is normal and behavior is normal. Judgment and thought content normal.             Wt Readings from Last 3 Encounters:    02/11/19   212 lb (96.2 kg)    01/31/19   212 lb (96.2 kg)    01/25/19   208 lb (94.3 kg)           ASSESSMENT & PLAN:       1.Preprocedure cardiac risk stratification: Patient is doing well from  a cardiac perspective and does not have any symptoms concerning for angina.  He continues to work out at a [employer] (despite COVID-19 restrictions) 5 to 6 days/week for a total of 45 minutes to 1 hour and 15 minutes without limitation from a cardiac perspective.  Patient is able to achieve approximately 18.95 METs per the [facility] Activity Status Index.  He is moderate risk for a high risk noncardiac surgery per the Revised Cardiac Risk Index with an estimated 6.6% rate of MI, pulmonary edema, VF, cardiac arrest, or complete heart block.  No further testing is needed in the preoperative timeframe.  Patient indicates he is uncertain when he will move forward with the surgery as he is basing this decision on the upcoming birth of a grandchild.      2.CAD status post CABG withoutangina: He is doing well without any symptoms concerning for angina.  Continue secondary prevention with aspirin, Plavix, simvastatin, and Toprol.  No plans for further ischemic evaluation at this time.      3.Postoperative A. fib: Noted following his bypass surgery as outlined above.  No further documented episodes.  No longer on oral anticoagulation given isolated, postoperative episode.  Should he have recurrence of arrhythmia anticoagulation will need to be revisited at that time.      4.PAD: Status post  angioplasty/stenting by vascular surgery as outlined above with continued lifestyle limiting claudication and nonhealing wound.  Planning for open/hybrid intervention per vascular surgery.  Remains on aspirin and Plavix.  Statin therapy as below.  Following surgical intervention of his lower extremity PAD, given his extensive CAD and PAD history, further discussion should be undertaken between the patient and his primary cardiologist regarding initiation of low-dose Xarelto 2.5 mg therapy.      5.Carotid artery disease: Carotid artery ultrasound from 12/2018 showed slight progression and right internal carotid artery stenosis to 40 to 59% with stable 1 to 39% left internal carotid artery stenosis.  Continue risk factor modification including recommendation for transitioning from medium intensity statin to high intensity statin as outlined below.      6.HTN: Blood pressure is well controlled today.  Continue current medical therapy including lisinopril and Toprol.      7.HLD: LDL of 56 from 12/2018.  Given extensive CAD/PAD recommend transitioning to high intensity statin.  Patient deferred this transition at this time and would like to revisit this in follow-up.      Disposition: F/u with [name] or an APP in 6 months.        Medication Adjustments/Labs and Tests Ordered:  Current medicines are reviewed at length with the patient today.  Concerns regarding medicines are outlined above. Medication changes, Labs and Tests ordered today are summarized above and listed in the Patient Instructions accessible in Encounters.      Signed,  Christell Faith, PA-C  02/11/2019 12:04 PM      [facility]  [address]  [address], [place] [id number]  [phone]          Electronically signed by Rise Mu, PA-C at 02/11/2019 12:29 PM             Office Visit on 02/11/2019               Detailed Report

## 2019-04-12 NOTE — Pre-Procedure Instructions (Signed)
Flora Lipps, MD  Physician  Critical Care  Progress Notes     Signed  Encounter Date:  04/11/2019          Signed         Show:Clear all [x] Manual[x] Template[x] Copied  Added by: [x] Flora Lipps, MD  [] Hover for details  [facility] Consultation      Date: 04/11/2019,   MRN# [id number] Randall Ali. 1940/12/09   CHIEF COMPLAINT:  Follow up OSA follo  HISTORY OF PRESENT ILLNESS   Follow-up sleep apnea Currently on BiPAP IPAP 20 EPAP 10  Compliance report reviewed with patient Previous compliance report showed greater than 90% compliance AHI down to 2.4 Minimal leak  Current compliance report is 100% for days and greater than 4 hours AHI is down to 1.1 Minimal leak Tolerating full facemask No significant issues  Patient was evaluated for hernia however no surgical intervention at this time  No evidence of heart failure at this time No evidence or signs of infection at this time No respiratory distress No fevers, chills, nausea, vomiting, diarrhea No evidence of lower extremity edema No evidence hemoptysis  Former smoker 3 to 4 packs a day for 50 years quit in 2001 CABG 2016  Overall doing well on BiPAP therapy    CT chest November 20, 2016 Shows right upper lobe nodule approximately 6.9 mm which has not changed since 2016 Patient has known about this right upper lobe nodule for the last 6 years After further evaluation right upper lobe lung nodule is benign      Current Medication:  Current Outpatient Medications:  .  Ascorbic Acid (VITAMIN C) 1000 MG tablet, Take 1,000 mg by mouth daily., Disp: , Rfl:  .  aspirin 81 MG EC tablet, Take 81 mg by mouth daily.  , Disp: , Rfl:  .  Calcium-Magnesium-Vitamin D (CALCIUM 1200+D3 PO), Take 1 tablet by mouth daily., Disp: , Rfl:  .  Cholecalciferol (VITAMIN D3) 50 MCG (2000 UT) TABS, Take 2,000 Units by mouth daily., Disp: , Rfl:  .  clopidogrel (PLAVIX) 75  MG tablet, Take 1 tablet (75 mg total) by mouth daily. (Patient taking differently: Take 75 mg by mouth at bedtime. ), Disp: 30 tablet, Rfl: 5 .  cyclobenzaprine (FLEXERIL) 10 MG tablet, Take 0.5-1 tablets (5-10 mg total) by mouth 2 (two) times daily as needed for muscle spasms., Disp: 30 tablet, Rfl: 1 .  ibuprofen (ADVIL,MOTRIN) 200 MG tablet, Take 400 mg by mouth every 6 (six) hours as needed for headache or moderate pain. , Disp: , Rfl:  .  lisinopril (ZESTRIL) 5 MG tablet, TAKE 1 TABLET BY MOUTH EVERY DAY (Patient taking differently: Take 5 mg by mouth every evening. ), Disp: 90 tablet, Rfl: 1 .  metoprolol succinate (TOPROL-XL) 25 MG 24 hr tablet, TAKE 1/2 TABLET BY MOUTH EVERY DAY (Patient taking differently: Take 12.5 mg by mouth at bedtime. ), Disp: 45 tablet, Rfl: 0 .  nitroGLYCERIN (NITROSTAT) 0.4 MG SL tablet, Place 1 tablet (0.4 mg total) under the tongue every 5 (five) minutes as needed for chest pain., Disp: 25 tablet, Rfl: 3 .  NONFORMULARY OR COMPOUNDED ITEM, Trimix (30/1/10)-(Pap/Phent/PGE)  Test Dose 3 48ml vials  Qty #3 [place] [phone] Fax [phone], Disp: 3 each, Rfl: 0 .  pantoprazole (PROTONIX) 40 MG tablet, Take 1 tablet (40 mg total) by mouth daily. (Patient taking differently: Take 40 mg by mouth every evening. ), Disp: 30 tablet, Rfl: 11 .  Polyethyl Glycol-Propyl Glycol (LUBRICANT EYE DROPS) 0.4-0.3 % SOLN, Place 1-2 drops into both eyes 3 (three) times daily as needed (burning eyes.)., Disp: , Rfl:  .  simvastatin (ZOCOR) 40 MG tablet, Take 1 tablet (40 mg total) by mouth at bedtime., Disp: 30 tablet, Rfl: 3 .  tamsulosin (FLOMAX) 0.4 MG CAPS capsule, TAKE 1 CAPSULE BY MOUTH EVERY DAY AFTER SUPPER (Patient taking differently: Take 0.4 mg by mouth daily. ), Disp: 90 capsule, Rfl: 1 .  vitamin B-12 (CYANOCOBALAMIN) 500 MCG tablet, Take 500 mcg by mouth daily., Disp: , Rfl:  .  vitamin E 1000 UNIT capsule, Take 1,000 Units by mouth daily. Vitamin E  450 mg, Disp: , Rfl:  .  Zinc 50 MG TABS, Take 50 mg by mouth daily., Disp: , Rfl:     ALLERGIES   Patient has no known allergies.     REVIEW OF SYSTEMS   Review of Systems  Constitutional: Positive for malaise/fatigue. Negative for chills, diaphoresis, fever and weight loss.  HENT: Negative for congestion and hearing loss.   Respiratory: Negative for cough, hemoptysis, sputum production, shortness of breath and wheezing.   Cardiovascular: Negative for chest pain, palpitations and orthopnea.  Gastrointestinal: Negative for heartburn.  Skin: Negative for rash.  Neurological: Negative for weakness.  All other systems reviewed and are negative.  .vs BP 136/74 (BP Location: Left Arm, Cuff Size: Normal)   Pulse 87   Temp 98.2 F (36.8 C) (Temporal)   Ht 5\' 8"  (1.727 m)   Wt 210 lb 9.6 oz (95.5 kg)   SpO2 97%   BMI 32.02 kg/m    PHYSICAL EXAM  Physical Exam  Constitutional: He is oriented to person, place, and time. He appears well-developed and well-nourished. No distress.  Eyes: No scleral icterus.  Neck: Neck supple.  Cardiovascular: Normal rate, regular rhythm and normal heart sounds.  No murmur heard. Pulmonary/Chest: Effort normal and breath sounds normal. No stridor. No respiratory distress. He has no wheezes.  Musculoskeletal: Normal range of motion.        General: No edema.  Neurological: He is alert and oriented to person, place, and time. No cranial nerve deficit.  Skin: Skin is warm.  Psychiatric: He has a normal mood and affect.        IMAGING    CT chest November 20, 2016 I have Independently reviewed images of  CT chest   Interpretation:6.9 MM RUL nodule noted Has not change in size of the last 2 years    ASSESSMENT/PLAN   78 year old pleasant white male seen today for follow-up sleep apnea History of right upper lobe lung nodule which is benign Setting of obesity and deconditioned state  OSA Patient is using and  benefiting from BiPAP therapy nightly No issues with the mask at this time Wears full facemask BiPAP 20/10 Excellent compliance report at this time  Right upper lobe lung nodule No change in size for the last 2 years No further CT chest at this time  Obesity -recommend significant weight loss -recommend changing diet  Deconditioned state -Recommend increased daily activity and exercise   COVID-19 EDUCATION: The signs and symptoms of COVID-19 were discussed with the patient and how to seek care for testing.  The importance of social distancing was discussed today. Hand Washing Techniques and avoid touching face was advised.     MEDICATION ADJUSTMENTS/LABS AND TESTS ORDERED: Continue BiPAP as prescribed   CURRENT MEDICATIONS REVIEWED AT LENGTH WITH PATIENT TODAY   Patient satisfied with Plan  of action and management. All questions answered  Follow up in 1 year   Kurian [name], M.D.  [facility]  Medical Director [place] Director [place] Department                   Electronically signed by Flora Lipps, MD at 04/11/2019 2:06 PM   Office Visit on 04/11/2019     Detailed Report     Note shared with patient

## 2019-04-12 NOTE — Patient Instructions (Signed)
Your procedure is scheduled on: 04-20-19 [facility] Report to Same Day Surgery 2nd floor medical mall [facility] Entrance-take elevator on left to 2nd floor.  Check in with surgery information desk.) To find out your arrival time please call [phone] between 1PM - 3PM on 04-29-19 TUESDAY  Remember: Instructions that are not followed completely may result in serious medical risk, up to and including death, or upon the discretion of your surgeon and anesthesiologist your surgery may need to be rescheduled.    _x___ 1. Do not eat food after midnight the night before your procedure. NO GUM OR CANDY AFTER MIDNIGHT. You may drink clear liquids up to 2 hours before you are scheduled to arrive at the hospital for your procedure.  Do not drink clear liquids within 2 hours of your scheduled arrival to the hospital.  Clear liquids include  --Water or Apple juice without pulp  --Gatorade  --Black Coffee or Clear Tea (No milk, no creamers, do not add anything to the coffee or Tea   ____Ensure clear carbohydrate drink on the way to the hospital for bariatric patients  ____Ensure clear carbohydrate drink 3 hours before surgery.    __x__ 2. No Alcohol for 24 hours before or after surgery.   __x__3. No Smoking or e-cigarettes for 24 prior to surgery.  Do not use any chewable tobacco products for at least 6 hour prior to surgery   ____  4. Bring all medications with you on the day of surgery if instructed.    __x__ 5. Notify your doctor if there is any change in your medical condition     (cold, fever, infections).    x___6. On the morning of surgery brush your teeth with toothpaste and water.  You may rinse your mouth with mouth wash if you wish.  Do not swallow any toothpaste or mouthwash.   Do not wear jewelry, make-up, hairpins, clips or nail polish.  Do not wear lotions, powders, or perfumes. You may wear deodorant.  Do not shave 48 hours prior to surgery. Men may shave face and  neck.  Do not bring valuables to the hospital.    [facility] is not responsible for any belongings or valuables.               Contacts, dentures or bridgework may not be worn into surgery.  Leave your suitcase in the car. After surgery it may be brought to your room.  For patients admitted to the hospital, discharge time is determined by your treatment team.  _  Patients discharged the day of surgery will not be allowed to drive home.  You will need someone to drive you home and stay with you the night of your procedure.    Please read over the following fact sheets that you were given:   [facility] Preparing for Surgery   _x___ TAKE THE FOLLOWING MEDICATION THE MORNING OF SURGERY WITH A SMALL SIP OF WATER. These include:  1. PROTONIX (PANTOPRAZOLE)  2.  3.  4.  5.  6.  ____Fleets enema or Magnesium Citrate as directed.   _x___ Use CHG Soap or sage wipes as directed on instruction sheet   ____ Use inhalers on the day of surgery and bring to hospital day of surgery  ____ Stop Metformin and Janumet 2 days prior to surgery.    ____ Take 1/2 of usual insulin dose the night before surgery and none on the morning surgery.   _x___ Follow recommendations from Cardiologist, Pulmonologist  or PCP regarding  stopping Aspirin, Coumadin, Plavix ,Eliquis, Effient, or Pradaxa, and Pletal-CONTINUE ASPIRIN (DO NOT TAKE AM OF SURGERY) AND STOP PLAVIX 5 DAYS PRIOR TO SURGERY (LAST DOSE ON 04-14-19 Thursday)  X____Stop Anti-inflammatories such as Advil, Aleve, Ibuprofen, Motrin, Naproxen, Naprosyn, Goodies powders or aspirin products NOW-OK to take Tylenol OR TRAMADOL IF NEEDED   _x___ Stop supplements until after surgery-STOP VITAMIN E NOW-MAY RESUME AFTER SURGERY   _X___ Bring C-Pap to the hospital.

## 2019-04-13 DIAGNOSIS — M9902 Segmental and somatic dysfunction of thoracic region: Secondary | ICD-10-CM | POA: Diagnosis not present

## 2019-04-13 DIAGNOSIS — M9901 Segmental and somatic dysfunction of cervical region: Secondary | ICD-10-CM | POA: Diagnosis not present

## 2019-04-13 DIAGNOSIS — M62830 Muscle spasm of back: Secondary | ICD-10-CM | POA: Diagnosis not present

## 2019-04-13 DIAGNOSIS — M5033 Other cervical disc degeneration, cervicothoracic region: Secondary | ICD-10-CM | POA: Diagnosis not present

## 2019-04-15 ENCOUNTER — Other Ambulatory Visit: Admission: RE | Admit: 2019-04-15 | Payer: PPO | Source: Ambulatory Visit

## 2019-04-15 DIAGNOSIS — M5033 Other cervical disc degeneration, cervicothoracic region: Secondary | ICD-10-CM | POA: Diagnosis not present

## 2019-04-15 DIAGNOSIS — M9901 Segmental and somatic dysfunction of cervical region: Secondary | ICD-10-CM | POA: Diagnosis not present

## 2019-04-15 DIAGNOSIS — M62830 Muscle spasm of back: Secondary | ICD-10-CM | POA: Diagnosis not present

## 2019-04-15 DIAGNOSIS — M9902 Segmental and somatic dysfunction of thoracic region: Secondary | ICD-10-CM | POA: Diagnosis not present

## 2019-04-18 ENCOUNTER — Other Ambulatory Visit
Admission: RE | Admit: 2019-04-18 | Discharge: 2019-04-18 | Disposition: A | Discharge status: Home / Self Care | Payer: PPO | Source: Ambulatory Visit | Attending: Vascular Surgery | Admitting: Vascular Surgery

## 2019-04-18 ENCOUNTER — Other Ambulatory Visit: Payer: Self-pay

## 2019-04-18 DIAGNOSIS — Z20828 Contact with and (suspected) exposure to other viral communicable diseases: Secondary | ICD-10-CM | POA: Diagnosis not present

## 2019-04-18 DIAGNOSIS — Z01812 Encounter for preprocedural laboratory examination: Secondary | ICD-10-CM | POA: Diagnosis not present

## 2019-04-18 LAB — SARS CORONAVIRUS 2 (TAT 6-24 HRS): SARS Coronavirus 2: NEGATIVE

## 2019-04-19 ENCOUNTER — Telehealth (INDEPENDENT_AMBULATORY_CARE_PROVIDER_SITE_OTHER): Payer: Self-pay

## 2019-04-19 NOTE — Telephone Encounter (Signed)
Called and spoke with the patient regarding his surgery on 04/20/2019. Patient has been rescheduled for surgery with Dr. Delana Meyer on 06/15/19. Patient will do his pre-op on 06/08/2019 @ 9:30 am and his Covid-19 testing on 06/13/19 between 12:30-2:30 pm at the [facility]. All new dates and times along with pre-surgical instructions will be mailed to the patient.

## 2019-04-27 ENCOUNTER — Ambulatory Visit: Payer: PPO | Admitting: Family Medicine

## 2019-04-29 ENCOUNTER — Other Ambulatory Visit: Payer: Self-pay

## 2019-04-29 MED ORDER — TAMSULOSIN HCL 0.4 MG PO CAPS: ORAL_CAPSULE | ORAL | 3 refills | Status: DC

## 2019-04-29 NOTE — Telephone Encounter (Signed)
E-scribed refill 

## 2019-05-02 ENCOUNTER — Other Ambulatory Visit: Payer: Self-pay

## 2019-05-02 ENCOUNTER — Encounter: Payer: Self-pay | Admitting: Family Medicine

## 2019-05-02 ENCOUNTER — Ambulatory Visit (INDEPENDENT_AMBULATORY_CARE_PROVIDER_SITE_OTHER): Payer: PPO | Admitting: Family Medicine

## 2019-05-02 DIAGNOSIS — M5442 Lumbago with sciatica, left side: Secondary | ICD-10-CM

## 2019-05-02 DIAGNOSIS — G4733 Obstructive sleep apnea (adult) (pediatric): Secondary | ICD-10-CM | POA: Diagnosis not present

## 2019-05-02 DIAGNOSIS — F5101 Primary insomnia: Secondary | ICD-10-CM | POA: Diagnosis not present

## 2019-05-02 DIAGNOSIS — I70213 Atherosclerosis of native arteries of extremities with intermittent claudication, bilateral legs: Secondary | ICD-10-CM | POA: Diagnosis not present

## 2019-05-02 DIAGNOSIS — G44229 Chronic tension-type headache, not intractable: Secondary | ICD-10-CM

## 2019-05-02 DIAGNOSIS — G8929 Other chronic pain: Secondary | ICD-10-CM

## 2019-05-02 MED ORDER — LISINOPRIL 5 MG PO TABS: 5.0000 mg | ORAL_TABLET | Freq: Every day | ORAL | 3 refills | Status: DC

## 2019-05-02 MED ORDER — TRAMADOL HCL 50 MG PO TABS: 50.0000 mg | ORAL_TABLET | Freq: Two times a day (BID) | ORAL | 0 refills | Status: DC | PRN

## 2019-05-02 MED ORDER — METOPROLOL SUCCINATE ER 25 MG PO TB24: 12.5000 mg | ORAL_TABLET | Freq: Every day | ORAL | 3 refills | Status: DC

## 2019-05-02 NOTE — Assessment & Plan Note (Signed)
Sleep maintenance insomnia. Discussed possible silenor use.  Stable period with ibuprofen/melatonin use when he wakes up at 1-2am.

## 2019-05-02 NOTE — Assessment & Plan Note (Signed)
Compliant with CPAP followed by pulm.

## 2019-05-02 NOTE — Patient Instructions (Addendum)
Stay off flexeril.  Continue CPAP  Continue ibuprofen or melatonin to help sleep.  Let me know if you're interested in trial silenor for sleep maintenance insomnia.  Return as needed or in 3-5 months for follow up visit.

## 2019-05-02 NOTE — Assessment & Plan Note (Signed)
Overall improved period.  Did not respond to nortriptyline. States flexeril didn't help headache. Advised sparing tramadol use.

## 2019-05-02 NOTE — Assessment & Plan Note (Signed)
Appreciate [facility] care - planned intervention 06/2019.

## 2019-05-02 NOTE — Assessment & Plan Note (Addendum)
Requests PRN tramadol refilled. Advised use sparingly for breakthrough pain after ibuprofen use. Has seen PM&R

## 2019-05-02 NOTE — Progress Notes (Signed)
This visit was conducted in person.  BP 126/64 (BP Location: Left Arm, Patient Position: Sitting, Cuff Size: Large)   Pulse 72   Temp 97.7 F (36.5 C) (Temporal)   Ht 5\' 8"  (1.727 m)   Wt 213 lb 4 oz (96.7 kg)   SpO2 95%   BMI 32.42 kg/m    CC: 6 wk f/u visit  Subjective:    Patient ID: Randall Lofts., male    DOB: [birth year], 78 y.o.   MRN: [id number]  HPI: Randall Livesey. is a 78 y.o. male presenting on 05/02/2019 for Follow-up (Here for 6 wk back pain and sleep f/u.)   Upcoming [facility] surgery (R fem endarterectomy with stenting of SFA/pop artery) ([place]) for PAD. On aspirin and plavix. Not happy with easy bruising.   OSA followed by Dr Mortimer Fries with good compliance.   Ongoing headaches - saw neuro and eye doctor. 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 He desires to restart this, states nortritpyline didn't help. Trial flexeril was sedating but didn't really help headache - and was not covered by insurance. Actually he states headaches have improved but seem to be more associated with eye strain. Had reassuring sinus evaluation by Dr Tami Ribas. Sleep maintenance insomnia. He can also take melatonin or ibuprofen 400mg  which help him fall back asleep.      Relevant past medical, surgical, family and social history reviewed and updated as indicated. Interim medical history since our last visit reviewed. Allergies and medications reviewed and updated. Outpatient Medications Prior to Visit  Medication Sig Dispense Refill  . Ascorbic Acid (VITAMIN C) 1000 MG tablet Take 1,000 mg by mouth daily.    [name] aspirin 81 MG EC tablet Take 81 mg by mouth daily.      . Calcium-Magnesium-Vitamin D (CALCIUM 1200+D3 PO) Take 1 tablet by mouth daily.    . Cholecalciferol (VITAMIN D3) 50 MCG (2000 UT) TABS Take 2,000 Units by mouth daily.    . clopidogrel (PLAVIX) 75 MG tablet Take 1 tablet (75 mg total) by mouth daily. (Patient taking differently:  Take 75 mg by mouth at bedtime. ) 30 tablet 5  . ibuprofen (ADVIL,MOTRIN) 200 MG tablet Take 400 mg by mouth every 6 (six) hours as needed for headache or moderate pain.     . nitroGLYCERIN (NITROSTAT) 0.4 MG SL tablet Place 1 tablet (0.4 mg total) under the tongue every 5 (five) minutes as needed for chest pain. 25 tablet 3  . NONFORMULARY OR COMPOUNDED ITEM Trimix (30/1/10)-(Pap/Phent/PGE)  Test Dose 3 58ml vials  Qty #3 Refills 0  [place] [phone] Fax [phone] 3 each 0  . pantoprazole (PROTONIX) 40 MG tablet Take 1 tablet (40 mg total) by mouth daily. (Patient taking differently: Take 40 mg by mouth every evening. ) 30 tablet 11  . Polyethyl Glycol-Propyl Glycol (LUBRICANT EYE DROPS) 0.4-0.3 % SOLN Place 1-2 drops into both eyes 3 (three) times daily as needed (burning eyes.).    [name] simvastatin (ZOCOR) 40 MG tablet Take 1 tablet (40 mg total) by mouth at bedtime. 30 tablet 3  . tamsulosin (FLOMAX) 0.4 MG CAPS capsule TAKE 1 CAPSULE BY MOUTH EVERY DAY AFTER SUPPER 90 capsule 3  . vitamin B-12 (CYANOCOBALAMIN) 500 MCG tablet Take 500 mcg by mouth daily.    . vitamin E 1000 UNIT capsule Take 1,000 Units by mouth daily. Vitamin E 450 mg    . Zinc 50 MG  TABS Take 50 mg by mouth daily.    [name] lisinopril (ZESTRIL) 5 MG tablet TAKE 1 TABLET BY MOUTH EVERY DAY (Patient taking differently: Take 5 mg by mouth every morning. ) 90 tablet 1  . metoprolol succinate (TOPROL-XL) 25 MG 24 hr tablet TAKE 1/2 TABLET BY MOUTH EVERY DAY (Patient taking differently: Take 12.5 mg by mouth at bedtime. ) 45 tablet 0  . traMADol (ULTRAM) 50 MG tablet Take 50-100 mg by mouth every 6 (six) hours as needed.     No facility-administered medications prior to visit.      Per HPI unless specifically indicated in ROS section below Review of Systems Objective:    BP 126/64 (BP Location: Left Arm, Patient Position: Sitting, Cuff Size: Large)   Pulse 72   Temp 97.7 F (36.5 C) (Temporal)   Ht 5\' 8"   (1.727 m)   Wt 213 lb 4 oz (96.7 kg)   SpO2 95%   BMI 32.42 kg/m   Wt Readings from Last 3 Encounters:  05/02/19 213 lb 4 oz (96.7 kg)  04/12/19 208 lb 5.4 oz (94.5 kg)  04/11/19 210 lb 9.6 oz (95.5 kg)    Physical Exam Vitals signs and nursing note reviewed.  Constitutional:      General: He is not in acute distress.    Appearance: Normal appearance. He is obese. He is not ill-appearing.  HENT:     Mouth/Throat:     Mouth: Mucous membranes are moist.     Pharynx: Oropharynx is clear. No posterior oropharyngeal erythema.  Eyes:     Extraocular Movements: Extraocular movements intact.     Pupils: Pupils are equal, round, and reactive to light.  Cardiovascular:     Rate and Rhythm: Normal rate and regular rhythm.     Pulses: Normal pulses.     Heart sounds: Normal heart sounds. No murmur.  Pulmonary:     Effort: Pulmonary effort is normal. No respiratory distress.     Breath sounds: Normal breath sounds. No wheezing, rhonchi or rales.  Skin:    Findings: No rash.  Neurological:     Mental Status: He is alert.  Psychiatric:        Mood and Affect: Mood normal.        Behavior: Behavior normal.       Results for orders placed or performed during the hospital encounter of 04/18/19  SARS CORONAVIRUS 2 (TAT 6-24 HRS) Nasopharyngeal Nasopharyngeal Swab   Specimen: Nasopharyngeal Swab  Result Value Ref Range   SARS Coronavirus 2 NEGATIVE NEGATIVE   Assessment & Plan:  This visit occurred during the SARS-CoV-2 public health emergency.  Safety protocols were in place, including screening questions prior to the visit, additional usage of staff PPE, and extensive cleaning of exam room while observing appropriate contact time as indicated for disinfecting solutions.   Problem List Items Addressed This Visit    OSA (obstructive sleep apnea)    Compliant with CPAP followed by pulm.       Insomnia    Sleep maintenance insomnia. Discussed possible silenor use.  Stable period with  ibuprofen/melatonin use when he wakes up at 1-2am.       Chronic headache    Overall improved period.  Did not respond to nortriptyline. States flexeril didn't help headache. Advised sparing tramadol use.       Relevant Medications   traMADol (ULTRAM) 50 MG tablet   metoprolol succinate (TOPROL-XL) 25 MG 24 hr tablet   Chronic back pain  Requests PRN tramadol refilled. Advised use sparingly for breakthrough pain after ibuprofen use. Has seen PM&R      Relevant Medications   traMADol (ULTRAM) 50 MG tablet   Atherosclerosis of native arteries of extremity with intermittent claudication (HCC)    Appreciate [facility] care - planned intervention 06/2019.       Relevant Medications   lisinopril (ZESTRIL) 5 MG tablet   traMADol (ULTRAM) 50 MG tablet   metoprolol succinate (TOPROL-XL) 25 MG 24 hr tablet       Meds ordered this encounter  Medications  . lisinopril (ZESTRIL) 5 MG tablet    Sig: Take 1 tablet (5 mg total) by mouth daily.    Dispense:  90 tablet    Refill:  3  . traMADol (ULTRAM) 50 MG tablet    Sig: Take 1-2 tablets (50-100 mg total) by mouth 2 (two) times daily as needed.    Dispense:  30 tablet    Refill:  0  . metoprolol succinate (TOPROL-XL) 25 MG 24 hr tablet    Sig: Take 0.5 tablets (12.5 mg total) by mouth daily.    Dispense:  45 tablet    Refill:  3   No orders of the defined types were placed in this encounter.   Patient Instructions  Stay off flexeril.  Continue CPAP  Continue ibuprofen or melatonin to help sleep.  Let me know if you're interested in trial silenor for sleep maintenance insomnia.  Return as needed or in 3-5 months for follow up visit.    Follow up plan: Return in about 5 months (around 09/30/2019) for follow up visit.  Ria Bush, MD

## 2019-05-25 DIAGNOSIS — D2261 Melanocytic nevi of right upper limb, including shoulder: Secondary | ICD-10-CM | POA: Diagnosis not present

## 2019-05-25 DIAGNOSIS — L570 Actinic keratosis: Secondary | ICD-10-CM | POA: Diagnosis not present

## 2019-05-25 DIAGNOSIS — C44311 Basal cell carcinoma of skin of nose: Secondary | ICD-10-CM | POA: Diagnosis not present

## 2019-05-25 DIAGNOSIS — Z85828 Personal history of other malignant neoplasm of skin: Secondary | ICD-10-CM | POA: Diagnosis not present

## 2019-05-25 DIAGNOSIS — D485 Neoplasm of uncertain behavior of skin: Secondary | ICD-10-CM | POA: Diagnosis not present

## 2019-05-25 DIAGNOSIS — D2262 Melanocytic nevi of left upper limb, including shoulder: Secondary | ICD-10-CM | POA: Diagnosis not present

## 2019-05-25 DIAGNOSIS — D2272 Melanocytic nevi of left lower limb, including hip: Secondary | ICD-10-CM | POA: Diagnosis not present

## 2019-05-25 DIAGNOSIS — X32XXXA Exposure to sunlight, initial encounter: Secondary | ICD-10-CM | POA: Diagnosis not present

## 2019-05-25 DIAGNOSIS — Z85820 Personal history of malignant melanoma of skin: Secondary | ICD-10-CM | POA: Diagnosis not present

## 2019-05-26 ENCOUNTER — Other Ambulatory Visit: Payer: Self-pay

## 2019-05-26 MED ORDER — SIMVASTATIN 40 MG PO TABS: 40.0000 mg | ORAL_TABLET | Freq: Every day | ORAL | 2 refills | Status: DC

## 2019-06-02 DIAGNOSIS — M62830 Muscle spasm of back: Secondary | ICD-10-CM | POA: Diagnosis not present

## 2019-06-02 DIAGNOSIS — M9901 Segmental and somatic dysfunction of cervical region: Secondary | ICD-10-CM | POA: Diagnosis not present

## 2019-06-02 DIAGNOSIS — M5033 Other cervical disc degeneration, cervicothoracic region: Secondary | ICD-10-CM | POA: Diagnosis not present

## 2019-06-02 DIAGNOSIS — M9902 Segmental and somatic dysfunction of thoracic region: Secondary | ICD-10-CM | POA: Diagnosis not present

## 2019-06-06 DIAGNOSIS — M5033 Other cervical disc degeneration, cervicothoracic region: Secondary | ICD-10-CM | POA: Diagnosis not present

## 2019-06-06 DIAGNOSIS — M9901 Segmental and somatic dysfunction of cervical region: Secondary | ICD-10-CM | POA: Diagnosis not present

## 2019-06-06 DIAGNOSIS — M9902 Segmental and somatic dysfunction of thoracic region: Secondary | ICD-10-CM | POA: Diagnosis not present

## 2019-06-06 DIAGNOSIS — M62830 Muscle spasm of back: Secondary | ICD-10-CM | POA: Diagnosis not present

## 2019-06-08 ENCOUNTER — Other Ambulatory Visit: Payer: Self-pay

## 2019-06-08 ENCOUNTER — Telehealth (INDEPENDENT_AMBULATORY_CARE_PROVIDER_SITE_OTHER): Payer: Self-pay

## 2019-06-08 ENCOUNTER — Encounter
Admission: RE | Admit: 2019-06-08 | Discharge: 2019-06-08 | Disposition: A | Discharge status: Home / Self Care | Payer: PPO | Source: Ambulatory Visit | Attending: Vascular Surgery | Admitting: Vascular Surgery

## 2019-06-08 NOTE — Telephone Encounter (Signed)
Patient called asking about stopping his Plavix for his surgery on 06/15/19. I advised that he stop his Plavix 5 days before and continue his Aspirin per the Op instruction sheet I have. Patient then stated he was told no one would stay overnight in the hospital. I explained that usually with the type of surgery he is having they stay at least one night, but that will be determined based on  his surgery.

## 2019-06-08 NOTE — Patient Instructions (Signed)
Your procedure is scheduled on: 06-14-18 [facility] Report to Same Day Surgery 2nd floor medical mall [facility] Entrance-take elevator on left to 2nd floor.  Check in with surgery information desk.) To find out your arrival time please call [phone] between 1PM - 3PM on 06-13-18 TUESDAY  Remember: Instructions that are not followed completely may result in serious medical risk, up to and including death, or upon the discretion of your surgeon and anesthesiologist your surgery may need to be rescheduled.    _x___ 1. Do not eat food after midnight the night before your procedure. NO GUM OR CANDY AFTER MIDNIGHT. You may drink clear liquids up to 2 hours before you are scheduled to arrive at the hospital for your procedure.  Do not drink clear liquids within 2 hours of your scheduled arrival to the hospital.  Clear liquids include  --Water or Apple juice without pulp  --Gatorade  --Black Coffee or Clear Tea (No milk, no creamers, do not add anything to the coffee or Tea) SUGAR IS OK TO ADD   ____Ensure clear carbohydrate drink on the way to the hospital for bariatric patients  ____Ensure clear carbohydrate drink 3 hours before surgery.     __x__ 2. No Alcohol for 24 hours before or after surgery.   __x__3. No Smoking or e-cigarettes for 24 prior to surgery.  Do not use any chewable tobacco products for at least 6 hour prior to surgery   ____  4. Bring all medications with you on the day of surgery if instructed.    __x__ 5. Notify your doctor if there is any change in your medical condition     (cold, fever, infections).    x___6. On the morning of surgery brush your teeth with toothpaste and water.  You may rinse your mouth with mouth wash if you wish.  Do not swallow any toothpaste or mouthwash.   Do not wear jewelry, make-up, hairpins, clips or nail polish.  Do not wear lotions, powders, or perfumes.  Do not shave 48 hours prior to surgery. Men may shave face and neck.  Do not  bring valuables to the hospital.    [facility] is not responsible for any belongings or valuables.               Contacts, dentures or bridgework may not be worn into surgery.  Leave your suitcase in the car. After surgery it may be brought to your room.  For patients admitted to the hospital, discharge time is determined by your treatment team.  _  Patients discharged the day of surgery will not be allowed to drive home.  You will need someone to drive you home and stay with you the night of your procedure.    Please read over the following fact sheets that you were given:   [facility] Preparing for Surgery   _x___ TAKE THE FOLLOWING MEDICATION THE MORNING OF SURGERY WITH A SMALL SIP OF WATER. These include:  1. PROTONIX (PANTOPRAZOLE)  2.  3.  4.  5.  6.  ____Fleets enema or Magnesium Citrate as directed.   _x___ Use CHG Soap or sage wipes as directed on instruction sheet   ____ Use inhalers on the day of surgery and bring to hospital day of surgery  ____ Stop Metformin and Janumet 2 days prior to surgery.    ____ Take 1/2 of usual insulin dose the night before surgery and none on the morning surgery.   _X___ Follow recommendations from  Cardiologist, Pulmonologist or PCP regarding stopping Aspirin, Coumadin, Plavix ,Eliquis, Effient, or Pradaxa, and Pletal-CONTINUE YOUR ASPIRIN (DO NOT TAKE ASPIRIN AM OF SURGERY) CALL DR SCHNIERS OFFICE ABOUT WHEN TO STOP YOUR PLAVIX  X____Stop Anti-inflammatories such as Advil, Aleve, Ibuprofen, Motrin, Naproxen, Naprosyn, Goodies powders or aspirin products NOW-OK to take Tylenol    _x___ Stop supplements until after surgery-STOP VITAMIN E NOW-MAY RESUME AFTER SURGERY   _X___ Bring C-Pap to the hospital.

## 2019-06-09 ENCOUNTER — Other Ambulatory Visit: Payer: Self-pay

## 2019-06-09 DIAGNOSIS — M5033 Other cervical disc degeneration, cervicothoracic region: Secondary | ICD-10-CM | POA: Diagnosis not present

## 2019-06-09 DIAGNOSIS — M9902 Segmental and somatic dysfunction of thoracic region: Secondary | ICD-10-CM | POA: Diagnosis not present

## 2019-06-09 DIAGNOSIS — M9901 Segmental and somatic dysfunction of cervical region: Secondary | ICD-10-CM | POA: Diagnosis not present

## 2019-06-09 DIAGNOSIS — M62830 Muscle spasm of back: Secondary | ICD-10-CM | POA: Diagnosis not present

## 2019-06-09 NOTE — Telephone Encounter (Signed)
Name of Medication: Tramadol Name of Pharmacy: [facility] or Written Date and Quantity: 05/02/19, #30 Last Office Visit and Type: 05/02/19, f/u back pain and sleep Next Office Visit and Type: 10/03/19, 5 mo f/u Last Controlled Substance Agreement Date: 01/18/16 Last UDS:  01/18/16

## 2019-06-10 ENCOUNTER — Other Ambulatory Visit: Payer: PPO

## 2019-06-13 ENCOUNTER — Other Ambulatory Visit
Admission: RE | Admit: 2019-06-13 | Discharge: 2019-06-13 | Disposition: A | Discharge status: Home / Self Care | Payer: PPO | Source: Ambulatory Visit | Attending: Vascular Surgery | Admitting: Vascular Surgery

## 2019-06-13 ENCOUNTER — Other Ambulatory Visit: Payer: Self-pay

## 2019-06-13 DIAGNOSIS — I252 Old myocardial infarction: Secondary | ICD-10-CM | POA: Diagnosis not present

## 2019-06-13 DIAGNOSIS — Z87891 Personal history of nicotine dependence: Secondary | ICD-10-CM | POA: Diagnosis not present

## 2019-06-13 DIAGNOSIS — Z809 Family history of malignant neoplasm, unspecified: Secondary | ICD-10-CM | POA: Diagnosis not present

## 2019-06-13 DIAGNOSIS — Z20822 Contact with and (suspected) exposure to covid-19: Secondary | ICD-10-CM | POA: Diagnosis present

## 2019-06-13 DIAGNOSIS — I70223 Atherosclerosis of native arteries of extremities with rest pain, bilateral legs: Secondary | ICD-10-CM | POA: Diagnosis present

## 2019-06-13 DIAGNOSIS — Z8249 Family history of ischemic heart disease and other diseases of the circulatory system: Secondary | ICD-10-CM | POA: Diagnosis not present

## 2019-06-13 DIAGNOSIS — E1151 Type 2 diabetes mellitus with diabetic peripheral angiopathy without gangrene: Secondary | ICD-10-CM | POA: Diagnosis present

## 2019-06-13 DIAGNOSIS — I10 Essential (primary) hypertension: Secondary | ICD-10-CM | POA: Diagnosis present

## 2019-06-13 DIAGNOSIS — K2100 Gastro-esophageal reflux disease with esophagitis, without bleeding: Secondary | ICD-10-CM | POA: Diagnosis present

## 2019-06-13 DIAGNOSIS — M79604 Pain in right leg: Secondary | ICD-10-CM | POA: Diagnosis present

## 2019-06-13 DIAGNOSIS — I70219 Atherosclerosis of native arteries of extremities with intermittent claudication, unspecified extremity: Secondary | ICD-10-CM | POA: Diagnosis not present

## 2019-06-13 DIAGNOSIS — Z951 Presence of aortocoronary bypass graft: Secondary | ICD-10-CM | POA: Diagnosis not present

## 2019-06-13 DIAGNOSIS — Z833 Family history of diabetes mellitus: Secondary | ICD-10-CM | POA: Diagnosis not present

## 2019-06-13 DIAGNOSIS — Z955 Presence of coronary angioplasty implant and graft: Secondary | ICD-10-CM | POA: Diagnosis not present

## 2019-06-13 DIAGNOSIS — Z85820 Personal history of malignant melanoma of skin: Secondary | ICD-10-CM | POA: Diagnosis not present

## 2019-06-13 DIAGNOSIS — I70211 Atherosclerosis of native arteries of extremities with intermittent claudication, right leg: Secondary | ICD-10-CM | POA: Diagnosis not present

## 2019-06-13 DIAGNOSIS — I739 Peripheral vascular disease, unspecified: Secondary | ICD-10-CM | POA: Diagnosis not present

## 2019-06-13 DIAGNOSIS — I70229 Atherosclerosis of native arteries of extremities with rest pain, unspecified extremity: Secondary | ICD-10-CM | POA: Diagnosis not present

## 2019-06-13 DIAGNOSIS — I75029 Atheroembolism of unspecified lower extremity: Secondary | ICD-10-CM | POA: Diagnosis not present

## 2019-06-13 DIAGNOSIS — K219 Gastro-esophageal reflux disease without esophagitis: Secondary | ICD-10-CM | POA: Diagnosis not present

## 2019-06-13 DIAGNOSIS — G4733 Obstructive sleep apnea (adult) (pediatric): Secondary | ICD-10-CM | POA: Diagnosis present

## 2019-06-13 DIAGNOSIS — I25118 Atherosclerotic heart disease of native coronary artery with other forms of angina pectoris: Secondary | ICD-10-CM | POA: Diagnosis present

## 2019-06-13 DIAGNOSIS — I480 Paroxysmal atrial fibrillation: Secondary | ICD-10-CM | POA: Diagnosis present

## 2019-06-13 DIAGNOSIS — I2510 Atherosclerotic heart disease of native coronary artery without angina pectoris: Secondary | ICD-10-CM | POA: Diagnosis not present

## 2019-06-13 DIAGNOSIS — E785 Hyperlipidemia, unspecified: Secondary | ICD-10-CM | POA: Diagnosis present

## 2019-06-13 LAB — TYPE AND SCREEN
ABO/RH(D): A NEG
Antibody Screen: NEGATIVE

## 2019-06-13 LAB — SARS CORONAVIRUS 2 (TAT 6-24 HRS): SARS Coronavirus 2: NEGATIVE

## 2019-06-13 MED ORDER — TRAMADOL HCL 50 MG PO TABS: 50.0000 mg | ORAL_TABLET | Freq: Two times a day (BID) | ORAL | 0 refills | Status: DC | PRN

## 2019-06-13 NOTE — Pre-Procedure Instructions (Signed)
Rise Mu, PA-C  Physician Assistant Certified  Cardiology     Progress Notes  Signed     Encounter Date:  02/11/2019                  Signed          Expand AllCollapse All            Expand widget buttonCollapse widget button    Show:Clear all   ManualTemplateCopied  Added by:     Rise Mu, PA-C   Hover for detailscustomization button                                                                                                                                                                                                 untitled image      Cardiology Office Note        Date:  02/11/2019      ID:  Randall Lofts., DOB 05-23-1941, MRN [id number]     PCP:  Randall Bush, MD     Cardiologist:  Randall Rogue, MD   Electrophysiologist:  None      Chief Complaint: Preprocedure cardiac evaluation      History of Present Illness:       Randall Ali. is a 79 y.o. male with history of CAD with inferior MI in 2001 status post PCI/DES to the RCA at that time status post four-vessel CABG on 01/24/2015 with postoperative A. fib, carotid artery disease, extensive PAD as outlined below, HTN, HLD, GERD with EGD in 07/2014 showing severe esophagitis, chronic back pain, ED, obesity, and OSA on CPAP who presents for preprocedure cardiac evaluation.     Admitted in 2011 for chest pain and diaphoresis. Stress test showed no ischemia with scar. Repeat admission in 2013 with worsening, exertional chest pain with LHC showing moderate OM disease, otherwise no significant stenosis. Recurrent chest pain in spring of 2016 with nuclear stress test negative for ischemia, EF 54%, low risk study. He continued to note chest pain leading to a LHC  in 01/2015 that showed severe 3-vessel CAD.  Given multivessel CAD, he underwent four-vessel CABG on 01/24/2015 (LIMA to LAD, SVG to OM1, SVG to OM2, SVG to rPDA).  Postoperative course was complicated by postop A. fib, delirium, and wound infection from sternal wound with cultures growing Enterobacter aerogenes.  He was seen and late 2018 with chest pain and underwent diagnostic cath in 06/2017 which showed severe three-vessel CAD including an occluded SVG to RCA.  The patient had a patent LIMA to LAD as well as  patent SVG to OM1 and OM2.  Medical management was recommended with deferment of intervention being reserved for refractory angina.  Echo on 06/18/2017 showed an EF of 55 to 60%, normal wall motion, grade 1 diastolic dysfunction, mild mitral regurgitation, mildly dilated left atrium.     He was most recently seen in the office in 06/2018 and denied any significant shortness of breath or chest pain.  He was compliant with medications.  Given his significant atherosclerotic disease initiation of Xarelto 2.5 mg was discussed with the patient, which was declined.  Patient contacted our office in 10/2018 after getting hearing aids with report of he could "hear his heartbeat."  Labs were unrevealing at that time as outlined below.  Follow-up EKG showed a sinus rhythm with rare PVC.     More recently, the patient was seen by vascular surgery for claudication.  Noninvasive imaging in 6 and 12/2018 showed showed a right side ABI of 0.83 and left side of 0.75.  As well as calcified plaque seen in the aorta with the largest portion of the aorta measuring 2 cm, common iliac arteries, and external iliac arteries and 40 to 59% stenosis in the right ICA with 1 to 39% stenosis in the left ICA with bilateral vertebral arteries demonstrating antegrade flow and normal flow hemodynamics in the bilateral subclavian veins.  Given worsening claudication-like symptoms the patient underwent lower extremity angiography on 01/04/2019  with kissing balloon angioplasty and stent placement to the right common iliac artery, kissing balloon angioplasty and stent placement to the left common iliac artery, and balloon angioplasty to the bilateral external iliac arteries.  Follow-up ABIs on 01/17/2019 were improved with a right sided ABI of 0.93 and left-sided ABI of 1.02.  The patient continued to experience discomfort along the right lower extremity leading to repeat lower extremity angiography on 01/25/2019 which showed his disease was not ideal for nonsurgical intervention.  The patient was seen in follow-up by vascular surgeon on 01/31/2019 with continued symptoms as well as ulceration and tissue loss of the right foot.  Given this, open surgical repair was recommended with a hybrid procedure as noted in the vascular surgery note.     Patient comes in doing very well from a cardiac perspective.  He denies any chest pain, shortness of breath, palpitations, dizziness, presyncope, or syncope.  No lower extremity swelling, abdominal distention, orthopnea, PND, early satiety.  He continues to exercise at a local gym 5 to 6 days/week for a total of 45 minutes to 1 hour and 15 minutes working on [employer] and stationary bike.  With this, he does not have any symptoms concerning for angina.  Since having lower extremity intervention on his left lower extremity he has done remarkably well.  His main limiting factor is claudication affecting the right lower extremity for which he is planning to undergo surgical intervention at a date to be determined based on the birth of his grandson.  He denies any falls, BRBPR, or melena.  Blood pressure typically runs in the 1 teens to 120s over 60s to 70s.  He is tolerating dual antiplatelet therapy without issues.  He has not needed any sublingual nitroglycerin in 1 to 2 years.        Labs:  01/2019 - serum creatinine 1.03  12/2018 - total cholesterol 150, triglycerides 76, HDL 43, LDL 56, potassium 4.8, AST/ALT  normal, albumin 4.1, Hgb 14.7, PLT 170  10/2018 - magnesium 1.9, TSH normal  Past Medical History:    Diagnosis   Date    .   Allergy            seasonal    .   Arthritis            all over- in general     .   CAD (coronary artery disease)            a. inferior wall MI 10/01 s/p PCI/DES to RCA; b. Myoview 3/16 neg for ischemia; c. LHC 8/16: ostLAD 80%, OM1 70%, OM2 70% x 2 lesions, mRCA 30%, dRCA 70% s/p 4-V CABG 01/24/15 (LIMA-LAD, VG- OM1, VG-OM2, VG-PDA)     .   Cancer (HCC)            skin, melanoma    .   Carotid artery disease ([place])            a. [place] 8/16: 1-39% bilateral ICA stenosis    .   Cataract            removed    .   Diastolic dysfunction            a. TTE 8/16: EF 55-60%, no RWMA, Gr1DD, calcified mitral annulus, mild biatrial enlargement    .   Erectile dysfunction        .   History of elbow surgery        .   HLD (hyperlipidemia)        .   HTN (hypertension)        .   Inferior myocardial infarction ([place])   10/01        stent RCA    .   Postoperative wound infection   02/02/2015    .   Reflux esophagitis        .   Sleep apnea   2017        CPAP at night                Past Surgical History:    Procedure   Laterality   Date    .   arm surgery       2010    .   CARDIAC CATHETERIZATION       06/24/11    .   CARDIAC CATHETERIZATION   N/A   01/18/2015        Procedure: Left Heart Cath with coronary angiography;  Surgeon: Minna Merritts, MD;  Location: [facility];  Service: Cardiovascular;  Laterality: N/A;    .   CARDIAC CATHETERIZATION   N/A   01/18/2015        Procedure: Intravascular Pressure Wire/FFR Study;  Surgeon: [name], MD;  Location: [place] [facility];  Service: Cardiovascular;  Laterality: N/A;    .   CAROTID STENT       03/10/2011     .   COLONOSCOPY       2010    .   COLONOSCOPY       06/14/2014        Dr Hilarie Fredrickson    .   CORONARY ARTERY BYPASS GRAFT   N/A   01/24/2015        Procedure: CORONARY ARTERY BYPASS GRAFTING x 4 (LIMA-LAD, SVG-Int 1- Int 2, SVG-PD) ENDOSCOPIC GREATER SAPHENOUS VEIN HARVEST LEFT LEG;  Surgeon: [name], MD;  Location: [facility];  Service: Open Heart Surgery;  Laterality: N/A;    .  ESOPHAGOGASTRODUODENOSCOPY (EGD) WITH PROPOFOL   N/A   04/24/2016        Procedure: ESOPHAGOGASTRODUODENOSCOPY (EGD) WITH PROPOFOL;  Surgeon: Jerene Bears, MD;  Location: [facility];  Service: Gastroenterology;  Laterality: N/A;    .   EYE SURGERY                lasik 15 yrs. ago, cataracts removed - both eyes     .   HAMMER TOE SURGERY                right toe    .   LEFT HEART CATH AND CORONARY ANGIOGRAPHY   Left   06/10/2017        Procedure: LEFT HEART CATH AND CORONARY ANGIOGRAPHY;  Surgeon: Minna Merritts, MD;  Location: [facility];  Service: Cardiovascular;  Laterality: Left;    .   LOWER EXTREMITY ANGIOGRAPHY   Left   01/04/2019        Procedure: LOWER EXTREMITY ANGIOGRAPHY;  Surgeon: Katha Cabal, MD;  Location: [facility];  Service: Cardiovascular;  Laterality: Left;    .   LOWER EXTREMITY ANGIOGRAPHY   Right   01/25/2019        Procedure: LOWER EXTREMITY ANGIOGRAPHY;  Surgeon: Katha Cabal, MD;  Location: [facility];  Service: Cardiovascular;  Laterality: Right;    .   NASAL SINUS SURGERY       2008        septpolasty, bilateral turbinate reduction    .   SHOULDER ARTHROSCOPY       2012    .   TEE WITHOUT CARDIOVERSION   N/A   01/24/2015        Procedure: TRANSESOPHAGEAL ECHOCARDIOGRAM (TEE);  Surgeon: [name], MD;  Location: [facility] [place];  Service: Open Heart Surgery;  Laterality: N/A;    .   [place]     .   UPPER GI ENDOSCOPY       07/2014, 04-24-16        Dr Raquel James    .   WRIST SURGERY       2011          Current Medications:  Active Medications  Allergies:   Patient has no known allergies.       Social History             Socioeconomic History    .   Marital status:   Single            Spouse name:   Not on file    .   Number of children:   Not on file    .   Years of education:   37    .   Highest education level:   Not on file    Occupational History    .   Occupation:   retired            Comment: Agricultural consultant    Social Needs    .   Financial resource strain:   Not hard at all    .   Food insecurity            Worry:   Never true            Inability:   Never true    .   Transportation needs            Medical:   No            Non-medical:   No    Tobacco Use    .   Smoking status:   Former Smoker            Packs/day:   2.50            Years:   40.00            Pack years:   100.00            Types:   Cigarettes            Quit date:   03/24/2000            Years since quitting:   18.8    .   Smokeless tobacco:   Never Used    Substance and Sexual Activity    .   Alcohol use:   Yes            Alcohol/week:   5.0 standard drinks            Types:   5 Shots of liquor per week    .   Drug use:   No    .   Sexual activity:   Yes    Lifestyle    .   Physical activity             Days per week:   5 days            Minutes per session:   60 min    .   Stress:   Not at all    Relationships    .   Social Airline pilot on phone:   Not on file            Gets together:   Not on file            Attends religious service:   Not on file            Active member of club or organization:   Not on file            Attends meetings of clubs or organizations:   Not on file  Relationship status:   Not on file    Other Topics   Concern    .   Not on file    Social History Narrative        Singled; lives with son and dog         Occ: retired, back part time at [employer];         Activity: gym 4-5d/wk        Diet: good water, fruits/vegetables daily        Caffeine Use-yes          Family History:   [name] family history includes Cancer in his paternal grandfather; Diabetes in his father; Heart disease in his mother; Heart disease (age of onset: 74) in his brother; Hypertension in his father and mother. There is no history of Colon cancer, Prostate cancer, Bladder Cancer, or Kidney cancer.     ROS:    Review of Systems   Constitutional: Negative for chills, diaphoresis, fever, malaise/fatigue and weight loss.   HENT: Negative for congestion.    Eyes: Negative for discharge and redness.   Respiratory: Negative for cough, hemoptysis, sputum production, shortness of breath and wheezing.    Cardiovascular: Positive for claudication. Negative for chest pain, palpitations, orthopnea, leg swelling and PND.   Gastrointestinal: Negative for abdominal pain, blood in stool, heartburn, melena, nausea and vomiting.   Genitourinary: Negative for hematuria.   Musculoskeletal: Negative for falls and myalgias.   Skin: Negative for rash.   Neurological: Negative for dizziness, tingling, tremors, sensory change, speech change,  focal weakness, loss of consciousness and weakness.   Endo/Heme/Allergies: Does not bruise/bleed easily.   Psychiatric/Behavioral: Negative for substance abuse. The patient is not nervous/anxious.    All other systems reviewed and are negative.            EKGs/Labs/Other Studies Reviewed:        Studies reviewed were summarized above. The additional studies were reviewed today:     2D Echo 06/2017:  - Left ventricle: The cavity size was normal. Systolic function was    normal. The estimated ejection fraction was in the range of 55%    to 60%. Wall motion was normal; there were no regional wall    motion abnormalities. Doppler parameters are consistent with    abnormal left ventricular relaxation (grade 1 diastolic    dysfunction).  - Mitral valve: Mildly calcified annulus. There was mild    regurgitation.  - Left atrium: The atrium was mildly dilated.  - Pulmonary arteries: Systolic pressure could not be accurately    estimated.  __________     [facility] 06/2017:  Left mainstem:   Large vessel that bifurcates into the LAD and left circumflex, no Mild diffuse disease  Left anterior descending (LAD):   Moderate sized vessel that extends to the apical region, diagonal branch 2 of small to moderate size, severe proximal and mid disease. Patent [place] to the LAD is patent  Left circumflex (LCx):  Moderate vessel with OM branch 2, Severe OM1 and OM2 disease, patent SVG to OM! Jump graft to OM2, patent with no significant disease  Right coronary artery (RCA):  Right dominant vessel moderate size with PL and PDA, severe proximal, mid and disease disease. SVG to PDA is occluded ostially  Left ventriculography: Left ventricular systolic function is normal, mild inferior wall hypokinesis, LVEF is estimated at 55%, there is no significant mitral regurgitation , no significant aortic valve stenosis  Final Conclusions:   Severe  three vessel disease, occluded SVG to the  RCA, Patent SVG to OM1 and OM2, patent LIMA to the LAD  Recommendations:  New occluded RCA Discussed with him in detail Will try medical management first Add plavix, nitrates If he requests intervention, will have to be performed in [place] with atherectomy        EKG:  EKG is ordered today.  The EKG ordered today demonstrates NSR, 74 bpm, prior inferior infarct, no acute ST-T changes, grossly unchanged from prior     Recent Labs:  10/27/2018: Magnesium 1.9; TSH 1.565  01/06/2019: ALT 14; Hemoglobin 14.7; Platelets 170.0; Potassium 4.8; Sodium 140  01/25/2019: BUN 25; Creatinine, Ser 1.03   Recent Lipid Panel  Labs (Brief)                                                                                                                            PHYSICAL EXAM:        VS:  BP 128/80 (BP Location: Left Arm, Patient Position: Sitting, Cuff Size: Normal)   Pulse 76   Ht 5\' 8"  (1.727 m)   Wt 212 lb (96.2 kg)   SpO2 96%   BMI 32.23 kg/m   BMI: Body mass index is 32.23 kg/m.     Physical Exam   Constitutional: He is oriented to person, place, and time. He appears well-developed and well-nourished.   HENT:   Head: Normocephalic and atraumatic.   Eyes: Right eye exhibits no discharge. Left eye exhibits no discharge.   Neck: Normal range of motion. No JVD present.   Cardiovascular: Normal rate, regular rhythm, S1 normal, S2 normal and normal heart sounds. Exam reveals no distant heart sounds, no friction rub, no midsystolic click and no opening snap.   No murmur heard.  Pulses:       Dorsalis pedis pulses are 0 on the right side and 1+ on the left side.   Pulmonary/Chest: Effort normal and breath sounds normal. No respiratory distress. He has no decreased breath sounds. He has no wheezes. He has no rales. He exhibits no tenderness.   Abdominal: Soft. He exhibits no distension. There is  no abdominal tenderness.   Musculoskeletal:         General: No edema.  Neurological: He is alert and oriented to person, place, and time.   Skin: Skin is warm and dry. No cyanosis. Nails show no clubbing.  Psychiatric: He has a normal mood and affect. His speech is normal and behavior is normal. Judgment and thought content normal.             Wt Readings from Last 3 Encounters:    02/11/19   212 lb (96.2 kg)    01/31/19   212 lb (96.2 kg)    01/25/19   208 lb (94.3 kg)           ASSESSMENT & PLAN:       1.Preprocedure cardiac risk stratification: Patient is doing well  from a cardiac perspective and does not have any symptoms concerning for angina.  He continues to work out at a [employer] (despite COVID-19 restrictions) 5 to 6 days/week for a total of 45 minutes to 1 hour and 15 minutes without limitation from a cardiac perspective.  Patient is able to achieve approximately 18.95 METs per the [employer] Activity Status Index.  He is moderate risk for a high risk noncardiac surgery per the Revised Cardiac Risk Index with an estimated 6.6% rate of MI, pulmonary edema, VF, cardiac arrest, or complete heart block.  No further testing is needed in the preoperative timeframe.  Patient indicates he is uncertain when he will move forward with the surgery as he is basing this decision on the upcoming birth of a grandchild.      2.CAD status post CABG withoutangina: He is doing well without any symptoms concerning for angina.  Continue secondary prevention with aspirin, Plavix, simvastatin, and Toprol.  No plans for further ischemic evaluation at this time.      3.Postoperative A. fib: Noted following his bypass surgery as outlined above.  No further documented episodes.  No longer on oral anticoagulation given isolated, postoperative episode.  Should he have recurrence of arrhythmia anticoagulation will need to be revisited at that time.      4.PAD: Status post  angioplasty/stenting by vascular surgery as outlined above with continued lifestyle limiting claudication and nonhealing wound.  Planning for open/hybrid intervention per vascular surgery.  Remains on aspirin and Plavix.  Statin therapy as below.  Following surgical intervention of his lower extremity PAD, given his extensive CAD and PAD history, further discussion should be undertaken between the patient and his primary cardiologist regarding initiation of low-dose Xarelto 2.5 mg therapy.      5.Carotid artery disease: Carotid artery ultrasound from 12/2018 showed slight progression and right internal carotid artery stenosis to 40 to 59% with stable 1 to 39% left internal carotid artery stenosis.  Continue risk factor modification including recommendation for transitioning from medium intensity statin to high intensity statin as outlined below.      6.HTN: Blood pressure is well controlled today.  Continue current medical therapy including lisinopril and Toprol.      7.HLD: LDL of 56 from 12/2018.  Given extensive CAD/PAD recommend transitioning to high intensity statin.  Patient deferred this transition at this time and would like to revisit this in follow-up.      Disposition: F/u with [name] or an APP in 6 months.        Medication Adjustments/Labs and Tests Ordered:  Current medicines are reviewed at length with the patient today.  Concerns regarding medicines are outlined above. Medication changes, Labs and Tests ordered today are summarized above and listed in the Patient Instructions accessible in Encounters.      Signed,  Christell Faith, PA-C  02/11/2019 12:04 PM      [facility]  [address]  [place], [place] [address]  [phone]          Electronically signed by Rise Mu, PA-C at 02/11/2019 12:29 PM             Office Visit on 02/11/2019               Detailed Report

## 2019-06-13 NOTE — Pre-Procedure Instructions (Signed)
Flora Lipps, MD  Physician  Critical Care  Progress Notes   Signed  Encounter Date: 04/11/2019          Signed         Show:Clear all  ManualTemplateCopied Added by:  Flora Lipps, MD Hover for details                                 [facility] Pulmonary Medicine Consultation  Date: 04/11/2019,  MRN# [id number] Randall Ali. 10-12-1940    CHIEF COMPLAINT:   Follow up OSA  follo    HISTORY OF PRESENT ILLNESS   Follow-up sleep apnea  Currently on BiPAP IPAP 20 EPAP 10  Compliance report reviewed with patient  Previous compliance report showed greater than 90% compliance  AHI down to 2.4  Minimal leak  Current compliance report is 100% for days and greater than 4 hours  AHI is down to 1.1  Minimal leak  Tolerating full facemask  No significant issues  Patient was evaluated for hernia however no surgical intervention at this time  No evidence of heart failure at this time  No evidence or signs of infection at this time  No respiratory distress  No fevers, chills, nausea, vomiting, diarrhea  No evidence of lower extremity edema  No evidence hemoptysis  Former smoker  3 to 4 packs a day for 50 years quit in 2001  CABG 2016  Overall doing well on BiPAP therapy  CT chest November 20, 2016  Shows right upper lobe nodule approximately 6.9 mm which has not changed since 2016  Patient has known about this right upper lobe nodule for the last 6 years  After further evaluation right upper lobe lung nodule is benign  Current Medication:   Current Outpatient Medications:  . Ascorbic Acid (VITAMIN C) 1000 MG tablet, Take 1,000 mg by mouth daily., Disp: , Rfl:  . aspirin 81 MG EC tablet, Take 81 mg by mouth daily. , Disp: , Rfl:  . Calcium-Magnesium-Vitamin D (CALCIUM 1200+D3 PO), Take 1 tablet by mouth daily., Disp: , Rfl:  . Cholecalciferol (VITAMIN D3) 50 MCG (2000 UT) TABS, Take 2,000 Units by mouth daily., Disp: , Rfl:  . clopidogrel  (PLAVIX) 75 MG tablet, Take 1 tablet (75 mg total) by mouth daily. (Patient taking differently: Take 75 mg by mouth at bedtime. ), Disp: 30 tablet, Rfl: 5  . cyclobenzaprine (FLEXERIL) 10 MG tablet, Take 0.5-1 tablets (5-10 mg total) by mouth 2 (two) times daily as needed for muscle spasms., Disp: 30 tablet, Rfl: 1  . ibuprofen (ADVIL,MOTRIN) 200 MG tablet, Take 400 mg by mouth every 6 (six) hours as needed for headache or moderate pain. , Disp: , Rfl:  . lisinopril (ZESTRIL) 5 MG tablet, TAKE 1 TABLET BY MOUTH EVERY DAY (Patient taking differently: Take 5 mg by mouth every evening. ), Disp: 90 tablet, Rfl: 1  . metoprolol succinate (TOPROL-XL) 25 MG 24 hr tablet, TAKE 1/2 TABLET BY MOUTH EVERY DAY (Patient taking differently: Take 12.5 mg by mouth at bedtime. ), Disp: 45 tablet, Rfl: 0  . nitroGLYCERIN (NITROSTAT) 0.4 MG SL tablet, Place 1 tablet (0.4 mg total) under the tongue every 5 (five) minutes as needed for chest pain., Disp: 25 tablet, Rfl: 3  . NONFORMULARY OR COMPOUNDED ITEM, Trimix (30/1/10)-(Pap/Phent/PGE) Test Dose 3 61ml vials Qty #3 [place] [phone] Fax [phone], Disp: 3 each, Rfl: 0  .  pantoprazole (PROTONIX) 40 MG tablet, Take 1 tablet (40 mg total) by mouth daily. (Patient taking differently: Take 40 mg by mouth every evening. ), Disp: 30 tablet, Rfl: 11  . Polyethyl Glycol-Propyl Glycol (LUBRICANT EYE DROPS) 0.4-0.3 % SOLN, Place 1-2 drops into both eyes 3 (three) times daily as needed (burning eyes.)., Disp: , Rfl:  . simvastatin (ZOCOR) 40 MG tablet, Take 1 tablet (40 mg total) by mouth at bedtime., Disp: 30 tablet, Rfl: 3  . tamsulosin (FLOMAX) 0.4 MG CAPS capsule, TAKE 1 CAPSULE BY MOUTH EVERY DAY AFTER SUPPER (Patient taking differently: Take 0.4 mg by mouth daily. ), Disp: 90 capsule, Rfl: 1  . vitamin B-12 (CYANOCOBALAMIN) 500 MCG tablet, Take 500 mcg by mouth daily., Disp: , Rfl:  . vitamin E 1000 UNIT capsule, Take 1,000 Units by mouth daily.  Vitamin E 450 mg, Disp: , Rfl:  . Zinc 50 MG TABS, Take 50 mg by mouth daily., Disp: , Rfl:    ALLERGIES   Patient has no known allergies.    REVIEW OF SYSTEMS   Review of Systems  Constitutional: Positive for malaise/fatigue. Negative for chills, diaphoresis, fever and weight loss.  HENT: Negative for congestion and hearing loss.  Respiratory: Negative for cough, hemoptysis, sputum production, shortness of breath and wheezing.  Cardiovascular: Negative for chest pain, palpitations and orthopnea.  Gastrointestinal: Negative for heartburn.  Skin: Negative for rash.  Neurological: Negative for weakness.  All other systems reviewed and are negative.   .vs  BP 136/74 (BP Location: Left Arm, Cuff Size: Normal)  Pulse 87  Temp 98.2 F (36.8 C) (Temporal)  Ht 5\' 8"  (1.727 m)  Wt 210 lb 9.6 oz (95.5 kg)  SpO2 97%  BMI 32.02 kg/m    PHYSICAL EXAM   Physical Exam  Constitutional: He is oriented to person, place, and time. He appears well-developed and well-nourished. No distress.  Eyes: No scleral icterus.  Neck: Neck supple.  Cardiovascular: Normal rate, regular rhythm and normal heart sounds.  No murmur heard.  Pulmonary/Chest: Effort normal and breath sounds normal. No stridor. No respiratory distress. He has no wheezes.  Musculoskeletal: Normal range of motion.  General: No edema.  Neurological: He is alert and oriented to person, place, and time. No cranial nerve deficit.  Skin: Skin is warm.  Psychiatric: He has a normal mood and affect.     IMAGING   CT chest November 20, 2016  I have Independently reviewed images of CT chest  Interpretation:6.9 MM RUL nodule noted  Has not change in size of the last 2 years    ASSESSMENT/PLAN   79 year old pleasant white male seen today for follow-up sleep apnea  History of right upper lobe lung nodule which is benign  Setting of obesity and deconditioned state  OSA  Patient is using and benefiting from BiPAP therapy nightly  No issues  with the mask at this time  Wears full facemask  BiPAP 20/10  Excellent compliance report at this time  Right upper lobe lung nodule  No change in size for the last 2 years  No further CT chest at this time  Obesity  -recommend significant weight loss  -recommend changing diet  Deconditioned state  -Recommend increased daily activity and exercise  COVID-19 EDUCATION:  The signs and symptoms of COVID-19 were discussed with the patient and how to seek care for testing.  The importance of social distancing was discussed today.  Hand Washing Techniques and avoid touching face was advised.  MEDICATION ADJUSTMENTS/LABS  AND TESTS ORDERED:  Continue BiPAP as prescribed  CURRENT MEDICATIONS REVIEWED AT LENGTH WITH PATIENT TODAY  Patient satisfied with Plan of action and management. All questions answered  Follow up in 1 year  Kurian [name], M.D.  [facility]  Medical Director [facility] Director [place] Department    Electronically signed by Flora Lipps, MD at 04/11/2019 2:06 PM       Office Visit on 04/11/2019  Detailed Report  Note shared with patient

## 2019-06-13 NOTE — Telephone Encounter (Signed)
Patient advised.

## 2019-06-13 NOTE — Telephone Encounter (Signed)
plz notify ERx

## 2019-06-13 NOTE — Telephone Encounter (Signed)
Patient left a voicemail stating that he requested a refill on his Tramadol last Monday and has not gotten the refill yet. Patient is requesting that this be done today.

## 2019-06-15 ENCOUNTER — Telehealth: Payer: Self-pay

## 2019-06-15 ENCOUNTER — Encounter: Payer: Self-pay | Admitting: Vascular Surgery

## 2019-06-15 ENCOUNTER — Inpatient Hospital Stay: Payer: PPO

## 2019-06-15 ENCOUNTER — Inpatient Hospital Stay: Payer: PPO | Admitting: Anesthesiology

## 2019-06-15 ENCOUNTER — Other Ambulatory Visit: Payer: Self-pay

## 2019-06-15 ENCOUNTER — Inpatient Hospital Stay
Admission: RE | Admit: 2019-06-15 | Discharge: 2019-06-16 | DRG: 272 | Disposition: A | Discharge status: Home / Self Care | Payer: PPO | Source: Ambulatory Visit | Attending: Vascular Surgery | Admitting: Vascular Surgery

## 2019-06-15 ENCOUNTER — Encounter
Admission: RE | Disposition: A | Discharge status: Home / Self Care | Payer: Self-pay | Source: Ambulatory Visit | Attending: Vascular Surgery

## 2019-06-15 DIAGNOSIS — I10 Essential (primary) hypertension: Secondary | ICD-10-CM | POA: Diagnosis present

## 2019-06-15 DIAGNOSIS — Z87891 Personal history of nicotine dependence: Secondary | ICD-10-CM | POA: Diagnosis not present

## 2019-06-15 DIAGNOSIS — Z85820 Personal history of malignant melanoma of skin: Secondary | ICD-10-CM

## 2019-06-15 DIAGNOSIS — Z833 Family history of diabetes mellitus: Secondary | ICD-10-CM | POA: Diagnosis not present

## 2019-06-15 DIAGNOSIS — Z951 Presence of aortocoronary bypass graft: Secondary | ICD-10-CM

## 2019-06-15 DIAGNOSIS — Z8249 Family history of ischemic heart disease and other diseases of the circulatory system: Secondary | ICD-10-CM

## 2019-06-15 DIAGNOSIS — I25118 Atherosclerotic heart disease of native coronary artery with other forms of angina pectoris: Secondary | ICD-10-CM | POA: Diagnosis present

## 2019-06-15 DIAGNOSIS — E785 Hyperlipidemia, unspecified: Secondary | ICD-10-CM | POA: Diagnosis present

## 2019-06-15 DIAGNOSIS — I252 Old myocardial infarction: Secondary | ICD-10-CM

## 2019-06-15 DIAGNOSIS — G4733 Obstructive sleep apnea (adult) (pediatric): Secondary | ICD-10-CM | POA: Diagnosis present

## 2019-06-15 DIAGNOSIS — E1151 Type 2 diabetes mellitus with diabetic peripheral angiopathy without gangrene: Secondary | ICD-10-CM | POA: Diagnosis present

## 2019-06-15 DIAGNOSIS — Z20822 Contact with and (suspected) exposure to covid-19: Secondary | ICD-10-CM | POA: Diagnosis present

## 2019-06-15 DIAGNOSIS — M79604 Pain in right leg: Secondary | ICD-10-CM | POA: Diagnosis present

## 2019-06-15 DIAGNOSIS — I75029 Atheroembolism of unspecified lower extremity: Secondary | ICD-10-CM | POA: Diagnosis present

## 2019-06-15 DIAGNOSIS — Z809 Family history of malignant neoplasm, unspecified: Secondary | ICD-10-CM | POA: Diagnosis not present

## 2019-06-15 DIAGNOSIS — Z955 Presence of coronary angioplasty implant and graft: Secondary | ICD-10-CM | POA: Diagnosis not present

## 2019-06-15 DIAGNOSIS — I70229 Atherosclerosis of native arteries of extremities with rest pain, unspecified extremity: Secondary | ICD-10-CM | POA: Diagnosis present

## 2019-06-15 DIAGNOSIS — I70223 Atherosclerosis of native arteries of extremities with rest pain, bilateral legs: Secondary | ICD-10-CM | POA: Diagnosis present

## 2019-06-15 DIAGNOSIS — K2100 Gastro-esophageal reflux disease with esophagitis, without bleeding: Secondary | ICD-10-CM | POA: Diagnosis present

## 2019-06-15 DIAGNOSIS — I480 Paroxysmal atrial fibrillation: Secondary | ICD-10-CM | POA: Diagnosis present

## 2019-06-15 HISTORY — PX: EMBOLECTOMY: SHX44

## 2019-06-15 HISTORY — PX: ENDARTERECTOMY FEMORAL: SHX5804

## 2019-06-15 HISTORY — PX: INSERTION OF ILIAC STENT: SHX6256

## 2019-06-15 LAB — SURGICAL PCR SCREEN
MRSA, PCR: NEGATIVE
Staphylococcus aureus: NEGATIVE

## 2019-06-15 SURGERY — ENDARTERECTOMY, FEMORAL
Anesthesia: General | Laterality: Right

## 2019-06-15 MED ORDER — ACETAMINOPHEN 500 MG PO TABS
ORAL_TABLET | ORAL | Status: AC
  Filled 2019-06-15: qty 2

## 2019-06-15 MED ORDER — HEPARIN SODIUM (PORCINE) 5000 UNIT/ML IJ SOLN
INTRAMUSCULAR | Status: AC
  Filled 2019-06-15: qty 1

## 2019-06-15 MED ORDER — PROPOFOL 10 MG/ML IV BOLUS
INTRAVENOUS | Status: AC
  Filled 2019-06-15: qty 20

## 2019-06-15 MED ORDER — HYDROMORPHONE HCL 1 MG/ML IJ SOLN
INTRAMUSCULAR | Status: AC
  Administered 2019-06-15: 1 mg via INTRAVENOUS
  Filled 2019-06-15: qty 1

## 2019-06-15 MED ORDER — CEFAZOLIN SODIUM-DEXTROSE 1-4 GM/50ML-% IV SOLN
INTRAVENOUS | Status: AC
  Administered 2019-06-15: 1000 mg via INTRAVENOUS
  Filled 2019-06-15: qty 50

## 2019-06-15 MED ORDER — TAMSULOSIN HCL 0.4 MG PO CAPS
0.4000 mg | ORAL_CAPSULE | Freq: Every day | ORAL | Status: DC
  Administered 2019-06-15 – 2019-06-16 (×2): 0.4 mg via ORAL
  Filled 2019-06-15 (×2): qty 1

## 2019-06-15 MED ORDER — ONDANSETRON HCL 4 MG/2ML IJ SOLN
INTRAMUSCULAR | Status: AC
  Filled 2019-06-15: qty 2

## 2019-06-15 MED ORDER — POLYVINYL ALCOHOL 1.4 % OP SOLN
1.0000 [drp] | Freq: Three times a day (TID) | OPHTHALMIC | Status: DC | PRN
  Filled 2019-06-15: qty 15

## 2019-06-15 MED ORDER — SODIUM CHLORIDE 0.9% FLUSH: 3.0000 mL | INTRAVENOUS | Status: DC | PRN

## 2019-06-15 MED ORDER — CYANOCOBALAMIN 500 MCG PO TABS
500.0000 ug | ORAL_TABLET | Freq: Every day | ORAL | Status: DC
  Administered 2019-06-15 – 2019-06-16 (×2): 500 ug via ORAL
  Filled 2019-06-15 (×2): qty 1

## 2019-06-15 MED ORDER — FENTANYL CITRATE (PF) 100 MCG/2ML IJ SOLN: 25.0000 ug | INTRAMUSCULAR | Status: DC | PRN

## 2019-06-15 MED ORDER — PHENYLEPHRINE HCL-NACL 10-0.9 MG/250ML-% IV SOLN
INTRAVENOUS | Status: DC | PRN
  Administered 2019-06-15: 30 ug/min via INTRAVENOUS

## 2019-06-15 MED ORDER — LISINOPRIL 5 MG PO TABS
5.0000 mg | ORAL_TABLET | Freq: Every day | ORAL | Status: DC
  Administered 2019-06-15 – 2019-06-16 (×2): 5 mg via ORAL
  Filled 2019-06-15 (×2): qty 1

## 2019-06-15 MED ORDER — SODIUM CHLORIDE 0.9 % IV SOLN: 250.0000 mL | INTRAVENOUS | Status: DC | PRN

## 2019-06-15 MED ORDER — SODIUM CHLORIDE 0.9 % IV SOLN
INTRAVENOUS | Status: DC | PRN
  Administered 2019-06-15: 150 mL via INTRAMUSCULAR

## 2019-06-15 MED ORDER — OXYCODONE HCL 5 MG PO TABS: 5.0000 mg | ORAL_TABLET | Freq: Once | ORAL | Status: DC | PRN

## 2019-06-15 MED ORDER — SODIUM CHLORIDE 0.9% FLUSH
3.0000 mL | Freq: Two times a day (BID) | INTRAVENOUS | Status: DC
  Administered 2019-06-15 – 2019-06-16 (×2): 3 mL via INTRAVENOUS

## 2019-06-15 MED ORDER — FENTANYL CITRATE (PF) 100 MCG/2ML IJ SOLN
INTRAMUSCULAR | Status: DC | PRN
  Administered 2019-06-15 (×4): 50 ug via INTRAVENOUS

## 2019-06-15 MED ORDER — ACETAMINOPHEN 10 MG/ML IV SOLN
INTRAVENOUS | Status: AC
  Filled 2019-06-15: qty 100

## 2019-06-15 MED ORDER — VITAMIN D3 25 MCG (1000 UNIT) PO TABS
2000.0000 [IU] | ORAL_TABLET | Freq: Every day | ORAL | Status: DC
  Administered 2019-06-15 – 2019-06-16 (×2): 2000 [IU] via ORAL
  Filled 2019-06-15 (×2): qty 2

## 2019-06-15 MED ORDER — DEXMEDETOMIDINE HCL 200 MCG/2ML IV SOLN
INTRAVENOUS | Status: DC | PRN
  Administered 2019-06-15 (×2): 8 ug via INTRAVENOUS

## 2019-06-15 MED ORDER — ACETAMINOPHEN 500 MG PO TABS
1000.0000 mg | ORAL_TABLET | Freq: Every day | ORAL | Status: DC
  Administered 2019-06-15: 1000 mg via ORAL

## 2019-06-15 MED ORDER — ASPIRIN EC 81 MG PO TBEC
81.0000 mg | DELAYED_RELEASE_TABLET | Freq: Every day | ORAL | Status: DC
  Administered 2019-06-15 – 2019-06-16 (×2): 81 mg via ORAL
  Filled 2019-06-15 (×3): qty 1

## 2019-06-15 MED ORDER — PANTOPRAZOLE SODIUM 40 MG PO TBEC
40.0000 mg | DELAYED_RELEASE_TABLET | Freq: Every day | ORAL | Status: DC
  Administered 2019-06-16: 40 mg via ORAL
  Filled 2019-06-15 (×2): qty 1

## 2019-06-15 MED ORDER — ACETAMINOPHEN 10 MG/ML IV SOLN
INTRAVENOUS | Status: DC | PRN
  Administered 2019-06-15: 1000 mg via INTRAVENOUS

## 2019-06-15 MED ORDER — METOPROLOL SUCCINATE ER 25 MG PO TB24
12.5000 mg | ORAL_TABLET | Freq: Every day | ORAL | Status: DC
  Administered 2019-06-15 – 2019-06-16 (×2): 12.5 mg via ORAL
  Filled 2019-06-15 (×2): qty 0.5

## 2019-06-15 MED ORDER — ONDANSETRON HCL 4 MG/2ML IJ SOLN: 4.0000 mg | Freq: Four times a day (QID) | INTRAMUSCULAR | Status: DC | PRN

## 2019-06-15 MED ORDER — BUPIVACAINE LIPOSOME 1.3 % IJ SUSP
INTRAMUSCULAR | Status: AC
  Filled 2019-06-15: qty 20

## 2019-06-15 MED ORDER — SUCCINYLCHOLINE CHLORIDE 20 MG/ML IJ SOLN
INTRAMUSCULAR | Status: AC
  Filled 2019-06-15: qty 1

## 2019-06-15 MED ORDER — HEPARIN SODIUM (PORCINE) 1000 UNIT/ML IJ SOLN
INTRAMUSCULAR | Status: DC | PRN
  Administered 2019-06-15: 2000 [IU] via INTRAVENOUS
  Administered 2019-06-15: 5000 [IU] via INTRAVENOUS

## 2019-06-15 MED ORDER — SIMVASTATIN 40 MG PO TABS
40.0000 mg | ORAL_TABLET | Freq: Every day | ORAL | Status: DC
  Administered 2019-06-15: 40 mg via ORAL
  Filled 2019-06-15 (×2): qty 1

## 2019-06-15 MED ORDER — SODIUM CHLORIDE 0.9 % IV SOLN: INTRAVENOUS | Status: DC

## 2019-06-15 MED ORDER — HYDROMORPHONE HCL 1 MG/ML IJ SOLN: 1.0000 mg | Freq: Once | INTRAMUSCULAR | Status: DC | PRN

## 2019-06-15 MED ORDER — ZINC SULFATE 220 (50 ZN) MG PO CAPS
220.0000 mg | ORAL_CAPSULE | Freq: Every day | ORAL | Status: DC
  Administered 2019-06-15 – 2019-06-16 (×2): 220 mg via ORAL
  Filled 2019-06-15 (×2): qty 1

## 2019-06-15 MED ORDER — LIDOCAINE HCL (PF) 2 % IJ SOLN
INTRAMUSCULAR | Status: AC
  Filled 2019-06-15: qty 10

## 2019-06-15 MED ORDER — HEPARIN SODIUM (PORCINE) 1000 UNIT/ML IJ SOLN
INTRAMUSCULAR | Status: AC
  Filled 2019-06-15: qty 1

## 2019-06-15 MED ORDER — ROCURONIUM BROMIDE 50 MG/5ML IV SOLN
INTRAVENOUS | Status: AC
  Filled 2019-06-15: qty 1

## 2019-06-15 MED ORDER — LACTATED RINGERS IV SOLN: INTRAVENOUS | Status: DC | PRN

## 2019-06-15 MED ORDER — ACETAMINOPHEN 325 MG PO TABS: 650.0000 mg | ORAL_TABLET | ORAL | Status: DC | PRN

## 2019-06-15 MED ORDER — ONDANSETRON HCL 4 MG/2ML IJ SOLN
INTRAMUSCULAR | Status: DC | PRN
  Administered 2019-06-15: 4 mg via INTRAVENOUS

## 2019-06-15 MED ORDER — SUGAMMADEX SODIUM 200 MG/2ML IV SOLN
INTRAVENOUS | Status: DC | PRN
  Administered 2019-06-15: 200 mg via INTRAVENOUS

## 2019-06-15 MED ORDER — HYDROMORPHONE HCL 1 MG/ML IJ SOLN
0.5000 mg | INTRAMUSCULAR | Status: DC | PRN
  Administered 2019-06-15: 0.5 mg via INTRAVENOUS
  Administered 2019-06-16: 1 mg via INTRAVENOUS

## 2019-06-15 MED ORDER — CEFAZOLIN SODIUM-DEXTROSE 1-4 GM/50ML-% IV SOLN
1.0000 g | Freq: Three times a day (TID) | INTRAVENOUS | Status: AC
  Administered 2019-06-15: 1 g via INTRAVENOUS

## 2019-06-15 MED ORDER — CHLORHEXIDINE GLUCONATE CLOTH 2 % EX PADS: 6.0000 | MEDICATED_PAD | Freq: Once | CUTANEOUS | Status: DC

## 2019-06-15 MED ORDER — ROCURONIUM BROMIDE 100 MG/10ML IV SOLN
INTRAVENOUS | Status: DC | PRN
  Administered 2019-06-15 (×3): 20 mg via INTRAVENOUS
  Administered 2019-06-15: 30 mg via INTRAVENOUS
  Administered 2019-06-15: 5 mg via INTRAVENOUS
  Administered 2019-06-15 (×2): 20 mg via INTRAVENOUS

## 2019-06-15 MED ORDER — DEXAMETHASONE SODIUM PHOSPHATE 10 MG/ML IJ SOLN
INTRAMUSCULAR | Status: DC | PRN
  Administered 2019-06-15: 10 mg via INTRAVENOUS

## 2019-06-15 MED ORDER — ASCORBIC ACID 500 MG PO TABS
1000.0000 mg | ORAL_TABLET | Freq: Every day | ORAL | Status: DC
  Administered 2019-06-15 – 2019-06-16 (×2): 1000 mg via ORAL
  Filled 2019-06-15 (×2): qty 2

## 2019-06-15 MED ORDER — DEXAMETHASONE SODIUM PHOSPHATE 10 MG/ML IJ SOLN
INTRAMUSCULAR | Status: AC
  Filled 2019-06-15: qty 1

## 2019-06-15 MED ORDER — HYDROMORPHONE HCL 1 MG/ML IJ SOLN
INTRAMUSCULAR | Status: AC
  Administered 2019-06-15: 0.5 mg via INTRAVENOUS
  Filled 2019-06-15: qty 1

## 2019-06-15 MED ORDER — OXYCODONE HCL 5 MG PO TABS
5.0000 mg | ORAL_TABLET | ORAL | Status: DC | PRN
  Filled 2019-06-15: qty 2

## 2019-06-15 MED ORDER — NITROGLYCERIN 0.4 MG SL SUBL: 0.4000 mg | SUBLINGUAL_TABLET | SUBLINGUAL | Status: DC | PRN

## 2019-06-15 MED ORDER — PROPOFOL 10 MG/ML IV BOLUS
INTRAVENOUS | Status: DC | PRN
  Administered 2019-06-15: 150 mg via INTRAVENOUS

## 2019-06-15 MED ORDER — CLOPIDOGREL BISULFATE 75 MG PO TABS
75.0000 mg | ORAL_TABLET | Freq: Every day | ORAL | Status: DC
  Administered 2019-06-15 – 2019-06-16 (×2): 75 mg via ORAL
  Filled 2019-06-15 (×2): qty 1

## 2019-06-15 MED ORDER — LACTATED RINGERS IV SOLN: INTRAVENOUS | Status: DC

## 2019-06-15 MED ORDER — CEFAZOLIN SODIUM-DEXTROSE 2-4 GM/100ML-% IV SOLN
2.0000 g | INTRAVENOUS | Status: AC
  Administered 2019-06-15 (×2): 2 g via INTRAVENOUS

## 2019-06-15 MED ORDER — CEFAZOLIN SODIUM-DEXTROSE 1-4 GM/50ML-% IV SOLN
INTRAVENOUS | Status: AC
  Filled 2019-06-15: qty 50

## 2019-06-15 MED ORDER — BUPIVACAINE HCL (PF) 0.5 % IJ SOLN
INTRAMUSCULAR | Status: AC
  Filled 2019-06-15: qty 30

## 2019-06-15 MED ORDER — CEFAZOLIN SODIUM-DEXTROSE 2-4 GM/100ML-% IV SOLN
INTRAVENOUS | Status: AC
  Filled 2019-06-15: qty 100

## 2019-06-15 MED ORDER — BUPIVACAINE HCL (PF) 0.5 % IJ SOLN
INTRAMUSCULAR | Status: DC | PRN
  Administered 2019-06-15: 10 mL

## 2019-06-15 MED ORDER — BUPIVACAINE LIPOSOME 1.3 % IJ SUSP
INTRAMUSCULAR | Status: DC | PRN
  Administered 2019-06-15: 10 mL

## 2019-06-15 MED ORDER — OXYCODONE HCL 5 MG/5ML PO SOLN: 5.0000 mg | Freq: Once | ORAL | Status: DC | PRN

## 2019-06-15 MED ORDER — "VISTASEAL 4 ML SINGLE DOSE KIT "
PACK | CUTANEOUS | Status: DC | PRN
  Administered 2019-06-15: 4 mL via TOPICAL

## 2019-06-15 MED ORDER — FENTANYL CITRATE (PF) 250 MCG/5ML IJ SOLN
INTRAMUSCULAR | Status: AC
  Filled 2019-06-15: qty 5

## 2019-06-15 MED ORDER — LIDOCAINE HCL (CARDIAC) PF 100 MG/5ML IV SOSY
PREFILLED_SYRINGE | INTRAVENOUS | Status: DC | PRN
  Administered 2019-06-15: 100 mg via INTRAVENOUS

## 2019-06-15 SURGICAL SUPPLY — 81 items
ADH SKN CLS APL DERMABOND .7 (GAUZE/BANDAGES/DRESSINGS) ×2
APL PRP STRL LF DISP 70% ISPRP (MISCELLANEOUS) ×4
APPLIER CLIP 11 MED OPEN (CLIP)
APPLIER CLIP 9.375 SM OPEN (CLIP)
APR CLP MED 11 20 MLT OPN (CLIP)
APR CLP SM 9.3 20 MLT OPN (CLIP)
BAG COUNTER SPONGE EZ (MISCELLANEOUS) ×2 IMPLANT
BAG DECANTER FOR FLEXI CONT (MISCELLANEOUS) ×3 IMPLANT
BAG SPNG 4X4 CLR HAZ (MISCELLANEOUS)
BLADE SURG 15 STRL LF DISP TIS (BLADE) ×2 IMPLANT
BLADE SURG 15 STRL SS (BLADE) ×3
BLADE SURG SZ11 CARB STEEL (BLADE) ×3 IMPLANT
BOOT SUTURE AID YELLOW STND (SUTURE) ×6 IMPLANT
BRUSH SCRUB EZ  4% CHG (MISCELLANEOUS) ×1
BRUSH SCRUB EZ 4% CHG (MISCELLANEOUS) ×2 IMPLANT
CANISTER SUCT 1200ML W/VALVE (MISCELLANEOUS) ×3 IMPLANT
CATH EMBOLECTOMY 5X80 (CATHETERS) ×1 IMPLANT
CATH FOGERTY 4X80 WAS (CATHETERS) ×1 IMPLANT
CHLORAPREP W/TINT 26 (MISCELLANEOUS) ×6 IMPLANT
CLIP APPLIE 11 MED OPEN (CLIP) IMPLANT
CLIP APPLIE 9.375 SM OPEN (CLIP) IMPLANT
COVER WAND RF STERILE (DRAPES) ×3 IMPLANT
DERMABOND ADVANCED (GAUZE/BANDAGES/DRESSINGS) ×1
DERMABOND ADVANCED .7 DNX12 (GAUZE/BANDAGES/DRESSINGS) ×2 IMPLANT
DEVICE STARCLOSE SE CLOSURE (Vascular Products) ×1 IMPLANT
DRAPE INCISE IOBAN 66X45 STRL (DRAPES) ×3 IMPLANT
DRESSING SURGICEL FIBRLLR 1X2 (HEMOSTASIS) ×2 IMPLANT
DRSG OPSITE POSTOP 4X6 (GAUZE/BANDAGES/DRESSINGS) ×3 IMPLANT
DRSG SURGICEL FIBRILLAR 1X2 (HEMOSTASIS) ×3
ELECT CAUTERY BLADE 6.4 (BLADE) ×3 IMPLANT
ELECT REM PT RETURN 9FT ADLT (ELECTROSURGICAL) ×3
ELECTRODE REM PT RTRN 9FT ADLT (ELECTROSURGICAL) ×2 IMPLANT
Edwards Lifesciences 4 french irrigation catheter ×1 IMPLANT
GLOVE BIO SURGEON STRL SZ7 (GLOVE) ×8 IMPLANT
GLOVE INDICATOR 7.5 STRL GRN (GLOVE) ×5 IMPLANT
GLOVE SURG SYN 8.0 (GLOVE) ×3 IMPLANT
GLOVE SURG SYN 8.0 PF PI (GLOVE) ×2 IMPLANT
GOWN STRL REUS W/ TWL LRG LVL3 (GOWN DISPOSABLE) ×4 IMPLANT
GOWN STRL REUS W/ TWL XL LVL3 (GOWN DISPOSABLE) ×4 IMPLANT
GOWN STRL REUS W/TWL LRG LVL3 (GOWN DISPOSABLE) ×6
GOWN STRL REUS W/TWL XL LVL3 (GOWN DISPOSABLE) ×12
IV NS 500ML (IV SOLUTION) ×3
IV NS 500ML BAXH (IV SOLUTION) ×2 IMPLANT
KIT TURNOVER KIT A (KITS) ×3 IMPLANT
LABEL OR SOLS (LABEL) ×3 IMPLANT
LIFESTENT SOLO 7X200X135 (Permanent Stent) ×1 IMPLANT
LOOP RED MAXI  1X406MM (MISCELLANEOUS) ×2
LOOP VESSEL MAXI 1X406 RED (MISCELLANEOUS) ×4 IMPLANT
LOOP VESSEL MINI 0.8X406 BLUE (MISCELLANEOUS) ×4 IMPLANT
LOOPS BLUE MINI 0.8X406MM (MISCELLANEOUS) ×2
NDL HYPO 18GX1.5 BLUNT FILL (NEEDLE) ×2 IMPLANT
NEEDLE HYPO 18GX1.5 BLUNT FILL (NEEDLE) ×3 IMPLANT
NEEDLE HYPO 22GX1.5 SAFETY (NEEDLE) ×1 IMPLANT
NS IRRIG 500ML POUR BTL (IV SOLUTION) ×3 IMPLANT
PACK BASIN MAJOR ARMC (MISCELLANEOUS) ×3 IMPLANT
PACK UNIVERSAL (MISCELLANEOUS) ×3 IMPLANT
PATCH CAROTID ECM VASC 1X10 (Prosthesis & Implant Heart) ×1 IMPLANT
PENCIL ELECTRO HAND CTR (MISCELLANEOUS) ×3 IMPLANT
STENT LIFESTAR 9X40 (Permanent Stent) ×2 IMPLANT
STENT LIFESTENT 5F 6X150X135 (Permanent Stent) ×1 IMPLANT
STENT LIFESTENT 7X150X130 (Permanent Stent) ×1 IMPLANT
SUT MNCRL+ 5-0 UNDYED PC-3 (SUTURE) ×2 IMPLANT
SUT MONOCRYL 5-0 (SUTURE) ×1
SUT PROLENE 5 0 RB 1 DA (SUTURE) ×6 IMPLANT
SUT PROLENE 6 0 BV (SUTURE) ×15 IMPLANT
SUT PROLENE 7 0 BV 1 (SUTURE) ×6 IMPLANT
SUT SILK 2 0 (SUTURE) ×3
SUT SILK 2-0 18XBRD TIE 12 (SUTURE) ×2 IMPLANT
SUT SILK 3 0 (SUTURE) ×3
SUT SILK 3-0 18XBRD TIE 12 (SUTURE) ×2 IMPLANT
SUT SILK 4 0 (SUTURE) ×3
SUT SILK 4-0 18XBRD TIE 12 (SUTURE) ×2 IMPLANT
SUT VIC AB 0 CT1 36 (SUTURE) ×2 IMPLANT
SUT VIC AB 2-0 CT1 27 (SUTURE) ×6
SUT VIC AB 2-0 CT1 TAPERPNT 27 (SUTURE) ×4 IMPLANT
SUT VIC AB 3-0 SH 27 (SUTURE) ×6
SUT VIC AB 3-0 SH 27X BRD (SUTURE) ×2 IMPLANT
SUT VICRYL+ 3-0 36IN CT-1 (SUTURE) ×6 IMPLANT
SYR 20ML LL LF (SYRINGE) ×4 IMPLANT
SYR 5ML LL (SYRINGE) ×3 IMPLANT
TRAY FOLEY MTR SLVR 16FR STAT (SET/KITS/TRAYS/PACK) ×3 IMPLANT

## 2019-06-15 NOTE — Telephone Encounter (Signed)
Pt currently in the hospital. Per colon report in 2016 it states to consider cologuard at 5 years due to prep. When the pt has recovered from his surgery do you want him to do cologuard. Please advise.

## 2019-06-15 NOTE — Anesthesia Preprocedure Evaluation (Addendum)
Anesthesia Evaluation  Patient identified by MRN, date of birth, ID band Patient awake    Reviewed: Allergy & Precautions, H&P , NPO status , Patient's Chart, lab work & pertinent test results  Airway Mallampati: I  TM Distance: <3 FB Neck ROM: limited    Dental  (+) Chipped   Pulmonary sleep apnea , neg COPD, former smoker,           Cardiovascular Exercise Tolerance: Good hypertension, (-) angina+ CAD, + Past MI, + CABG and + Peripheral Vascular Disease  (-) CHF + dysrhythmias (h/o post op Afib, resolved)   Echo 06/16/17: - Left ventricle: The cavity size was normal. Systolic function was   normal. The estimated ejection fraction was in the range of 55%   to 60%. Wall motion was normal; there were no regional wall   motion abnormalities. Doppler parameters are consistent with   abnormal left ventricular relaxation (grade 1 diastolic   dysfunction). - Mitral valve: Mildly calcified annulus. There was mild   regurgitation. - Left atrium: The atrium was mildly dilated. - Pulmonary arteries: Systolic pressure could not be accurately   estimated.   Neuro/Psych  Headaches, negative psych ROS   GI/Hepatic Neg liver ROS, hiatal hernia, GERD  ,  Endo/Other  negative endocrine ROS  Renal/GU      Musculoskeletal   Abdominal   Peds  Hematology negative hematology ROS (+)   Anesthesia Other Findings Past Medical History: No date: Allergy     Comment:  seasonal No date: Arthritis     Comment:  all over- in general  No date: CAD (coronary artery disease)     Comment:  a. inferior wall MI 10/01 s/p PCI/DES to RCA; b. Myoview              3/16 neg for ischemia; c. LHC 8/16: ostLAD 80%, OM1 70%,               OM2 70% x 2 lesions, mRCA 30%, dRCA 70% s/p 4-V CABG               01/24/15 (LIMA-LAD, VG- OM1, VG-OM2, VG-PDA)  No date: Cancer ([place])     Comment:  skin, melanoma No date: Carotid artery disease ([place])     Comment:  a.  [place] 8/16: 1-39% bilateral ICA stenosis No date: Cataract     Comment:  removed No date: Diastolic dysfunction     Comment:  a. TTE 8/16: EF 55-60%, no RWMA, Gr1DD, calcified mitral              annulus, mild biatrial enlargement No date: Erectile dysfunction No date: GERD (gastroesophageal reflux disease) No date: History of elbow surgery No date: History of hiatal hernia No date: HLD (hyperlipidemia) No date: HTN (hypertension) 10/01: Inferior myocardial infarction ([place])     Comment:  stent RCA 02/02/2015: Postoperative wound infection No date: Reflux esophagitis 2017: Sleep apnea     Comment:  CPAP at night  Past Surgical History: 2010: arm surgery 06/24/11: CARDIAC CATHETERIZATION 01/18/2015: CARDIAC CATHETERIZATION; N/A     Comment:  Procedure: Left Heart Cath with coronary angiography;                Surgeon: Minna Merritts, MD;  Location: [place]               CV LAB;  Service: Cardiovascular;  Laterality: N/A; 01/18/2015: CARDIAC CATHETERIZATION; N/A     Comment:  Procedure: Intravascular Pressure Wire/FFR Study;  Surgeon: [name], MD;  Location: [place]               CV LAB;  Service: Cardiovascular;  Laterality: N/A; 03/10/2011: CAROTID STENT 2010: COLONOSCOPY 06/14/2014: COLONOSCOPY     Comment:  Dr Hilarie Fredrickson 01/24/2015: CORONARY ARTERY BYPASS GRAFT; N/A     Comment:  Procedure: CORONARY ARTERY BYPASS GRAFTING x 4               (LIMA-LAD, SVG-Int 1- Int 2, SVG-PD) ENDOSCOPIC GREATER               SAPHENOUS VEIN HARVEST LEFT LEG;  Surgeon: [name], MD;  Location: [place];  Service: Open Heart               Surgery;  Laterality: N/A; 04/24/2016: ESOPHAGOGASTRODUODENOSCOPY (EGD) WITH PROPOFOL; N/A     Comment:  Procedure: ESOPHAGOGASTRODUODENOSCOPY (EGD) WITH               PROPOFOL;  Surgeon: Jerene Bears, MD;  Location: [facility]               ENDOSCOPY;  Service: Gastroenterology;  Laterality: N/A; No date: EYE SURGERY      Comment:  lasik 15 yrs. ago, cataracts removed - both eyes  No date: HAMMER TOE SURGERY     Comment:  right toe 06/10/2017: LEFT HEART CATH AND CORONARY ANGIOGRAPHY; Left     Comment:  Procedure: LEFT HEART CATH AND CORONARY ANGIOGRAPHY;                Surgeon: Minna Merritts, MD;  Location: [place]               CV LAB;  Service: Cardiovascular;  Laterality: Left; 01/04/2019: LOWER EXTREMITY ANGIOGRAPHY; Left     Comment:  Procedure: LOWER EXTREMITY ANGIOGRAPHY;  Surgeon:               Katha Cabal, MD;  Location: [place] CV LAB;               Service: Cardiovascular;  Laterality: Left; 01/25/2019: LOWER EXTREMITY ANGIOGRAPHY; Right     Comment:  Procedure: LOWER EXTREMITY ANGIOGRAPHY;  Surgeon:               Katha Cabal, MD;  Location: [place] CV LAB;               Service: Cardiovascular;  Laterality: Right; 2008: NASAL SINUS SURGERY     Comment:  septpolasty, bilateral turbinate reduction 2012: SHOULDER ARTHROSCOPY 01/24/2015: TEE WITHOUT CARDIOVERSION; N/A     Comment:  Procedure: TRANSESOPHAGEAL ECHOCARDIOGRAM (TEE);                Surgeon: [name], MD;  Location: [place];                Service: Open Heart Surgery;  Laterality: N/A; 1994: TOE SURGERY 07/2014, 04-24-16: UPPER GI ENDOSCOPY     Comment:  Dr Raquel James 2011: WRIST SURGERY     Reproductive/Obstetrics negative OB ROS                            Anesthesia Physical Anesthesia Plan  ASA: III  Anesthesia Plan: General ETT   Post-op Pain Management:    Induction:   PONV Risk Score and Plan: Ondansetron, Dexamethasone and Treatment may vary  due to age or medical condition  Airway Management Planned: Video Laryngoscope Planned  Additional Equipment:   Intra-op Plan:   Post-operative Plan:   Informed Consent: I have reviewed the patients History and Physical, chart, labs and discussed the procedure including the risks, benefits and alternatives  for the proposed anesthesia with the patient or authorized representative who has indicated his/her understanding and acceptance.     Dental Advisory Given  Plan Discussed with: Anesthesiologist  Anesthesia Plan Comments:       Anesthesia Quick Evaluation

## 2019-06-15 NOTE — Op Note (Signed)
OPERATIVE NOTE   PROCEDURE: 1. Right common femoral, superficial femoral and profunda femoris endarterectomy with Cormatrix patch angioplasty 2. Open angioplasty and stent placement right tibioperoneal trunk with a 6 mm x 4 cm life stent postdilated to 5 mm with Lutonix drug-eluting balloon. 3. Open angioplasty and stent placement right superficial femoral and popliteal arteries 4. Thromboembolectomy with a #5 Fogarty right popliteal and superficial femoral arteries 5. Percutaneous transluminal angioplasty and stent placement left common iliac artery extending into the external iliac artery with a 9 mm x 40 mm life star stent postdilated to 8 mm with a Lutonix drug-eluting balloon. 6. Open transluminal angioplasty and stent placement right distal common iliac extending into the external iliac artery with a 9 mm x 40 mm life star stent postdilated to 8 mm with a Lutonix drug-eluting balloon 7. 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  ASSISTANT(S): None  ANESTHESIA: general  ESTIMATED BLOOD LOSS: 150 cc  FINDING(S): 1. Profound calcific plaque noted of the right common femoral extending past the initial bifurcation of the profunda femoris arteries as well as down the extensive length of the SFA  SPECIMEN(S):  Calcific plaque from the common femoral, superficial femoral and the profunda femoris artery  INDICATIONS:   Randall Ali. 79 y.o. y.o.male who presents with complaints of lifestyle limiting claudication and pain continuously in the right and left  lower extremities. The patient has documented severe atherosclerotic occlusive disease and has undergone minimally invasive treatments in the past. However, at this point his primary area of stricture stenosis resides in the common femoral and origins of the superficial femoral and profunda femoris extending into these arteries and therefore this is not amenable to intervention. The patient is therefore undergoing open endarterectomy. The risks and benefits of surgery have been reviewed with the patient, all questions have answered; alternative therapies have been reviewed as well and the patient has agreed to proceed with surgical open repair.  DESCRIPTION: After obtaining full informed written consent, the patient was brought back to the operating room and placed supine upon the operating table.  The patient received IV antibiotics prior to induction.  After obtaining adequate anesthesia, the patient was prepped and draped in the standard fashion for right femoral exposure.  Co-surgeons are required because this is a complicated procedure with work being performed simultaneously from both the patient's right left sides.  This also expedites the procedure making a shorter operative time reducing complications and improving patient safety.  Attention was turned to the right groin with Dr. Lucky Ali working on the patient's left and myself working on the right of the patient.  Vertical  Incision was made over the right common femoral artery and dissection carried down to the common femoral artery with electrocautery.  I dissected out the common femoral artery from the distal external iliac artery (identified by the superficial circumflex vessels) down to the femoral bifurcation.  On initial inspection, the common femoral artery was: densely calcified and there was no palpable pulse noted.    Subsequently the dissection was continued to include all circumflex branches and the profunda femoral artery and superficial  femoral artery. The superficial femoral artery was dissected circumferentially for  a distance of approximately 3-4 cm and the profunda femoris was dissected circumferentially out to the fourth order branches individual vessel loops were placed around each branch.  Control of all branches was obtained with vessel loops.  A softer area in the distal external iliac artery amendable to clamping was identified.    The patient was given 5000 units of Heparin intravenously, which was a therapeutic bolus.   After waiting 3 minutes, the distal external iliac artery was clamped and all of the vessel loops were placed under tension.  Arteriotomy was made in the common femoral artery with a 11-blade and extended it with a Potts scissor proximally and distally extending the distal end down the SFA for approximately 3 cm.   Endarterectomy was then performed under direct visualization using a freer elevator and a right angle from the mid common femoral extending up both proximally and distally. Proximally the endarterectomy was brought up to the level of the clamp where a clean edge was obtained. Distally the endarterectomy was carried down to a soft spot in the SFA where a feathered edge would was obtained.  7-0 Prolene interrupted tacking sutures were placed to secure the leading edge of the plaque in the SFA.  The profunda femoris was treated with an extensive eversion endarterectomy extending approximately 2 cm distally again obtaining a featheredge on both sides right and left.   At this point, a corematrix patch was fashioned for the geometry of the arteriotomy.  The patch was sewn to the artery with 2 running stitches of 6-0 Prolene, running from each end.  On the lateral wall and interrupted Prolene suture was placed for the superior and inferior stitches leaving a small gap in the suture line through which the 8 French sheath will be placed.  J-wire was then advanced through the Into the right superficial  femoral artery.  8 French sheath was then placed without difficulty.  Silastic vessel loop was cinched up around the sheath to maintain hemostasis.  Angiography was then performed by hand-injection.  A greater than 90% stenosis was noted in the proximal tibioperoneal trunk.  Angioplasty of this region with a 5 mm x 40 mm Lutonix drug-eluting balloon inflated to 12 atm for 1 minute yielded a greater than 60% residual stenosis and therefore a 6 mm x 40 mm life stent was deployed across this region and then postdilated with a 5 mm Lutonix balloon inflated to 8 atm.  Follow-up imaging now demonstrated less than 15% residual stenosis and attention was turned to the SFA and popliteal.  I selected a 7 mm x 200 mm life stent and beginning at the level of the tibial plateau deploying the stent more proximally.  A second 7 mm x 150 mm life stent was then utilized extending the stented segment proximally.  This length of stent was required because there were multiple atherosclerotic lesions in series the most severe of which was the mid popliteal lesion of greater than 90% the lesion at Hunter's canal of greater than 80% and then more proximally several lesions of greater than 70%.  The stents were then postdilated with a 7 mm Lutonix drug-eluting balloons inflated to 10 atm for 30 to 60 seconds.  Follow-up imaging demonstrated the severe popliteal lesion was still significantly stenotic and a 6 mm x 40 mm Dorado balloon was advanced down to this segment and inflated to 16 atm for 1 minute.  Follow-up imaging now demonstrated less than 20% residual stenosis throughout the entire length of  the SFA however mobile thrombus was noted at several locations.  At this point I also had difficulty aspirating from the 8 French sheath and this was removed over the wire and flushed demonstrating fresh thrombus within it as well.  2000 more units of heparin was given.  The 8 French sheath was reinserted.  A #5 Fogarty was then advanced  under fluoroscopy down to the distal edge of the popliteal stent and then pulled backwards.  A small amount of contrast was used in the balloon so that we could follow this under fluoroscopy.  Upon removal of the Fogarty there is now brisk backbleeding.  Attention is now turned to the iliac disease.  With Dr. Lucky Ali working on the patient's left-hand side ultrasound was placed in a sterile sleeve.  The left common femoral was imaged it was echolucent and pulsatile indicating patency.  Images recorded for the permanent record.  A Seldinger needle was then inserted into the proximal common femoral followed by a J-wire and then a 7 French sheath.  Advantage wire and pigtail catheter were then positioned in the distal aorta and oblique views of the aortic bifurcation iliac arteries were obtained.  These demonstrated a greater than 70% stenosis of the distal left common iliac artery extending into the external iliac artery.  Similarly a approximately 70% stenosis was noted in the distal common iliac artery on the right this extended into the external as well.  We elected to treat the left side first and Dr. Lucky Ali selected a 9 mm x 40 mm life star stent deployed it across the left common iliac lesion and postdilated it with an 8 mm Lutonix drug-eluting balloon inflated to 10 atm.  Follow-up imaging demonstrated less than 10% residual stenosis.  And attention was turned to the right-sided lesion where I advanced a 9 mm x 40 mm life star stent deployed it across the lesion and postdilated with a 8 mm x 40 mm Lutonix drug-eluting balloon.  Follow-up imaging demonstrated less than 10% residual stenosis.  We then elected to remove the right femoral sheath and complete the patch angioplasty repair.  Prior to completing the patch angioplasty, the profunda femoral artery was flushed as was the superficial femoral artery. The system was then forward flushed. The endarterectomy site was then irrigated copiously with heparinized  saline. The patch angioplasty was completed using the 6-0 Prolene in the usual fashion.  Flow was then reestablished first to the profunda femoris and then the superficial femoral artery. Any gaps or bleeding sites in the suture line were easily controlled with a 6-0 Prolene suture.  Given the findings of thrombus and the need for the thromboembolectomy we felt that it was indicated to obtain distal runoff.  And Dr. Lucky Ali using the pigtail catheter and the advantage wire from the left femoral approach across the aortic bifurcation advance the catheter down to the common femoral.  Hand-injection of contrast was then used to perform distal runoff demonstrated wide patency of the endarterectomy site wide patency of the profunda femoris as well as wide patency of the SFA popliteal and stent within the TP trunk.  There is two-vessel runoff via the peroneal and the posterior tibial which is consistent with the preoperative angiogram.  Having successfully revascularized the left lower extremity we elected to close the right groin incision.  We also obtained an LAO view of the left femoral and after inspection of the image deployed a Star close successfully for treatment of the left common femoral arteriotomy.  The right groin was then irrigated copiously with sterile saline and subsequently Evicel and Surgicel were placed in the wound. The incision was repaired with a double layer of 2-0 Vicryl, a double layer of 3-0 Vicryl, and a layer of 4-0 Monocryl in a subcuticular fashion.  The skin was cleaned, dried, and reinforced with Dermabond.  COMPLICATIONS: None  CONDITION: Randall Ali, M.D. [facility] Office: [phone]  06/15/2019, 5:23 PM

## 2019-06-15 NOTE — Anesthesia Procedure Notes (Signed)
Arterial Line Insertion Start/End1/11/2019 8:50 AM, 06/15/2019 9:30 AM Performed by: Tera Mater, MD, Justus Memory, CRNA, anesthesiologist  Patient location: OR. Preanesthetic checklist: patient identified, IV checked, site marked, risks and benefits discussed, surgical consent, monitors and equipment checked, pre-op evaluation, timeout performed and anesthesia consent Right, ulnar was placed Catheter size: 20 G Seldinger technique used Allen's test indicative of satisfactory collateral circulation Attempts: 3 Procedure performed using ultrasound guided technique. Ultrasound Notes:anatomy identified, needle tip was noted to be adjacent to the nerve/plexus identified and no ultrasound evidence of intravascular and/or intraneural injection Following insertion, dressing applied. Patient tolerated the procedure with difficulty.

## 2019-06-15 NOTE — Anesthesia Procedure Notes (Addendum)
Procedure Name: Intubation Date/Time: 06/15/2019 8:58 AM Performed by: Justus Memory, CRNA Pre-anesthesia Checklist: Patient identified, Patient being monitored, Timeout performed, Emergency Drugs available and Suction available Patient Re-evaluated:Patient Re-evaluated prior to induction Oxygen Delivery Method: Circle system utilized Preoxygenation: Pre-oxygenation with 100% oxygen Induction Type: IV induction Ventilation: Mask ventilation without difficulty Laryngoscope Size: Mac and 3 Grade View: Grade II Tube type: Oral Tube size: 7.0 mm Number of attempts: 1 Airway Equipment and Method: Stylet Placement Confirmation: ETT inserted through vocal cords under direct vision,  positive ETCO2 and breath sounds checked- equal and bilateral Secured at: 21 cm Tube secured with: Tape Dental Injury: Teeth and Oropharynx as per pre-operative assessment

## 2019-06-15 NOTE — Progress Notes (Signed)
Ch visit pt during rounds. Pt is a 39 YOM here for a vascular procedure for a blood clot in his upper leg. Pt had a moderate affect upon ch entry. Ch provided words of comfort for pt as he was receiving an update from the surgeon. No further needs at this time.    06/15/19 0900  Clinical Encounter Type  Visited With Patient;Health care provider  Visit Type Social support;Pre-op  Stress Factors  Patient Stress Factors Health changes  Family Stress Factors None identified

## 2019-06-15 NOTE — Transfer of Care (Signed)
Immediate Anesthesia Transfer of Care Note  Patient: Randall Ali.  Procedure(s) Performed: ENDARTERECTOMY FEMORAL (Right ) INSERTION OF ILIAC STENT ( STENTING OF SFA/POP ARTERY ) (Right ) EMBOLECTOMY  Patient Location: PACU  Anesthesia Type:General  Level of Consciousness: sedated  Airway & Oxygen Therapy: Patient Spontanous Breathing and Patient connected to face mask oxygen  Post-op Assessment: Report given to RN and Post -op Vital signs reviewed and stable  Post vital signs: Reviewed and stable  Last Vitals:  Vitals Value Taken Time  BP 140/69 06/15/19 1337  Temp    Pulse 76 06/15/19 1344  Resp 30 06/15/19 1344  SpO2 96 % 06/15/19 1344  Vitals shown include unvalidated device data.  Last Pain:  Vitals:   06/15/19 0757  TempSrc: Tympanic  PainSc: 0-No pain         Complications: No apparent anesthesia complications

## 2019-06-15 NOTE — Telephone Encounter (Signed)
-----   Message from Maury Dus, RN sent at 06/14/2014  4:52 PM EST ----- Regarding: Cologuard Pt needs recall cologuard

## 2019-06-15 NOTE — Op Note (Signed)
OPERATIVE NOTE   PROCEDURE: 1.   Right common femoral, profunda femoris, and superficial femoral artery endarterectomies and patch angioplasty 2.   Right lower extremity angiogram 3.   Angioplasty to the right tibioperoneal trunk with 5 mm diameter Lutonix drug-coated angioplasty balloon 4.  Stent placement to the right tibioperoneal artery with 6 mm diameter by 4 cm length life stent 5.  Stent placement to the right SFA and popliteal artery x2 with 7 mm diameter by 20 cm length and 7 mm diameter by 15 cm length life stent postdilated with 7 mm diameter with Lutonix drug-coated angioplasty balloons 6.  Fogarty embolectomy balloon to the right SFA and popliteal arteries for thrombus after above procedures 7.  Ultrasound guidance for vascular access to the left femoral artery 8.  Life star stent placement to the left iliac arteries from the distal left common iliac artery down to the mid external iliac artery with a 9 mm diameter by 6 cm length stent postdilated with an 8 mm diameter Lutonix drug-coated angioplasty balloon 9.  Life star stent placement to the right external iliac artery with a 9 mm diameter by 4 cm length stent postdilated with an 8 mm diameter Lutonix drug-coated angioplasty balloon 10.  Catheter placement into the right common femoral artery for imaging following percutaneous intervention and subsequent thrombectomy with right lower extremity angiogram from the left femoral approach.    PRE-OPERATIVE DIAGNOSIS: 1.Atherosclerotic occlusive disease bilateral lower extremities with rest pain   POST-OPERATIVE DIAGNOSIS: Same  SURGEON: Leotis Pain, MD  CO-surgeon:  Dr. Hortencia Pilar, MD  ANESTHESIA:  general  ESTIMATED BLOOD LOSS: 150 cc  FINDING(S): 1.  significant plaque in right common femoral, profunda femoris, and superficial femoral arteries  SPECIMEN(S):  Right common femoral, profunda femoris, and superficial femoral artery plaque.  INDICATIONS:    Patient  presents with rest pain disabling claudication symptoms with symptoms a little worse on the right than the left.  Right femoral endarterectomy is planned to try to improve perfusion.  He also had known significant iliac disease bilaterally and right SFA disease that concomitant intervention was planned on.  The risks and benefits as well as alternative therapies including intervention were reviewed in detail all questions were answered the patient agrees to proceed with surgery.  DESCRIPTION: After obtaining full informed written consent, the patient was brought back to the operating room and placed supine upon the operating table.  The patient received IV antibiotics prior to induction.  After obtaining adequate anesthesia, the patient was prepped and draped in the standard fashion appropriate time out is called.    Vertical incision was created overlying the right femoral arteries. The common femoral artery proximally, and superficial femoral artery, and primary profunda femoris artery branches were encircled with vessel loops and prepared for control. The right femoral arteries were found to have significant plaque from the common femoral artery into the profunda and superficial femoral arteries.   5000 units of heparin was given and allowed circulate for 5 minutes.   Attention is then turned to the right femoral artery.  An arteriotomy is made with 11 blade and extended with Potts scissors in the common femoral artery and carried down onto the first 3 cm of the right superficial femoral artery. An endarterectomy was then performed. [facility] was used to create a plane. The proximal endpoint was cut flush with tenotomy scissors. This was in the proximal common femoral artery. An eversion endarterectomy was then performed for the first 2-3 cm  of the profunda femoris artery including both the primary branches of the profunda femoris artery.  There is fairly extensive plaque in the origin of  the profunda femoris artery and the bulky calcific plaque was removed with the eversion endarterectomy down into the profunda femoris artery with a good distal endpoint created. Good backbleeding was then seen. The distal endpoint of the superficial femoral artery endarterectomy was created with gentle traction and the distal endpoint was tacked down with three 7-0 Prolene sutures.  The Cormatrix patcth is then selected and prepared for a patch angioplasty.  It is cut and beveled and started at the proximal endpoint with a 6-0 Prolene suture.  Approximately one half of the suture line is run medially and laterally and the distal end point was cut and bevelled to match the arteriotomy.  A second 6-0 Prolene was started at the distal end point and run to the mid portion to complete the arteriotomy medially.  The suture line was run laterally leaving a gap to place the sheath for both proximal and distal access.   Dr. Delana Meyer then placed the sheath in an antegrade fashion through the gap down into the proximal SFA to address the infrainguinal disease.  For full details of this portion the procedure please see his note, but in short there was a high-grade stenosis in the proximal tibioperoneal trunk with two-vessel runoff below this.  The popliteal artery at the level of the knee had a nearly occlusive stenosis.  The SFA and above-knee popliteal artery were diffusely diseased with multiple areas of greater than 70% stenosis.  A 5 mm diameter by 4 cm Lutonix drug-coated angioplasty balloon was used to address the tibioperoneal trunk stenosis with suboptimal result seen after angioplasty.  A 6 mm diameter by 4 cm length life stent was then deployed in the distal popliteal artery and tibioperoneal trunk to address this lesion and it was postdilated with a 5 mm balloon with excellent angiographic ablation result and less than 20% residual stenosis.  The SFA and popliteal arteries were then treated with a 7 mm diameter by  20 cm length and a 7 mm diameter by 15 cm length life stent to address from just below the knee up to the proximal to mid SFA.  These were postdilated with 7 mm diameter Lutonix drug-coated angioplasty balloons and a high-pressure 6 mm diameter angioplasty balloon was used at the level of the knee.  Imaging following this showed thrombus in the SFA and popliteal arteries and there was thrombus in the sheath even though the patient had been heparinized.  Additional heparin was given.  Together, we then used a 5 Fogarty embolectomy balloon to advance down to the below-knee popliteal artery and pull out thrombus although a scant amount of thrombus was seen on initial thrombectomy.  This did result in return of backbleeding however. The antegrade sheath was removed from the right femoral and Dr. Delana Meyer placed a retrograde sheath up pointing towards the iliacs.  I then used ultrasound to access the left common femoral artery without difficulty with a Seldinger needle and a permanent image was recorded.  A 7 French sheath was placed in the left femoral artery.  A pigtail catheter is placed into the aorta from left femoral approach and imaging showed moderate stenosis in the proximal external iliac arteries bilaterally.  The previously placed common iliac artery stents had only mild residual stenosis.  The left external iliac artery stenosis appeared to be in the 60 to 70% range  in the right external iliac artery appeared to be in the 60% range.  Both vessels had poststenotic dilatation.  Pigtail catheter was removed and the left side was addressed first.  With an advantage wire up into the aorta, a 9 mm diameter by 6 cm length life star stent was deployed from the distal left common iliac artery down to the mid external iliac artery.  This was postdilated with an 8 mm diameter Lutonix drug-coated angioplasty balloon with excellent angiographic completion result and only about 10% residual stenosis.  The right side was  then addressed.  Over an advantage wire, a 9 mm diameter by 4 cm length life star stent was deployed from the most distal right common iliac artery down to the proximal to mid external iliac artery.  This was postdilated with an 8 mm diameter by 4 cm length Lutonix drug-coated angioplasty balloon with less than 10% residual stenosis.  The right femoral sheath was removed but we felt it would be helpful to image the right side after closure to make sure that there was adequate flow distally so the left femoral sheath remained in place. We then proceeded with closure of the right femoral arteriotomy.  The suture line was completed. The vessel was flushed prior to release of control and completion of the anastomosis.  At this point, flow was established first to the profunda femoris artery and then to the superficial femoral artery. Easily palpable pulses are noted well beyond the anastomosis and both arteries.  6-0 Prolene patch sutures were used for hemostasis and hemostasis was complete. From the left femoral approach, I then took the aortic bifurcation with a pigtail catheter and used the advantage wire to advance the pigtail catheter down to the right common femoral artery proximally at the top of the patch angioplasty.  Selective right lower extremity angiogram was then performed.  This showed a widely patent right common femoral, profunda femoris, and superficial femoral endarterectomy without significant residual stenosis.  There was now brisk flow through the stents in the right SFA and popliteal arteries with resolution of the previously seen thrombus.  There were 2 areas of mild residual stenosis in the 20% range 1 in the mid SFA and one in the popliteal artery.  The tibioperoneal trunk was patent with two-vessel runoff distally although there was some spasm of the peroneal artery where the wire had been previously.  The right lower extremity flow was markedly improved in the left iliac and been addressed.   The pigtail catheter was removed and StarClose closure device was deployed in the left femoral artery after the sheath was removed.  Excellent hemostasis was seen and a sterile dressing was placed.  Fibrillar and Vistacell topical hemostatic agents were placed in the femoral incision and hemostasis was complete. The femoral incision was then closed in a layered fashion with 2 layers of 2-0 Vicryl, 2 layers of 3-0 Vicryl, and 4-0 Monocryl for the skin closure. Dermabond and sterile dressing were then placed over the incision.  The patient was then awakened from anesthesia and taken to the recovery room in stable condition having tolerated the procedure well.  COMPLICATIONS: None  CONDITION: Stable     Leotis Pain 06/15/2019 1:20 PM   This note was created with Dragon Medical transcription system. Any errors in dictation are purely unintentional.

## 2019-06-15 NOTE — H&P (Signed)
[name] Admission History & Physical  MRN : [id number]  Randall Ali. is a 79 y.o. (01/21/1941) male who presents with chief complaint of No chief complaint on file. [name]  History of Present Illness:   The patient returns to the office for followup and review status post angiogram without intervention 01/25/2019. The patient notes continued pain in the right lower extremity symptoms. Patient reports increased rest pain symptoms over the past month. No new ulcers or wounds have occurred since the last visit.  There have been no significant changes to the patient's overall health care.  The patient denies amaurosis fugax or recent TIA symptoms. There are no recent neurological changes noted. The patient denies history of DVT, PE or superficial thrombophlebitis. The patient denies recent episodes of angina or shortness of breath.  Current Facility-Administered Medications  Medication Dose Route Frequency Provider Last Rate Last Admin  . ceFAZolin (ANCEF) 2-4 GM/100ML-% IVPB           . ceFAZolin (ANCEF) IVPB 2g/100 mL premix  2 g Intravenous On Call to [place], [place], NP      . Chlorhexidine Gluconate Cloth 2 % PADS 6 each  6 each Topical Once Kris Hartmann, NP       And  . Chlorhexidine Gluconate Cloth 2 % PADS 6 each  6 each Topical Once Kris Hartmann, NP      . lactated ringers infusion   Intravenous Continuous Molli Barrows, MD        Past Medical History:  Diagnosis Date  . Allergy    seasonal  . Arthritis    all over- in general   . CAD (coronary artery disease)    a. inferior wall MI 10/01 s/p PCI/DES to RCA; b. Myoview 3/16 neg for ischemia; c. LHC 8/16: ostLAD 80%, OM1 70%, OM2 70% x 2 lesions, mRCA 30%, dRCA 70% s/p 4-V CABG 01/24/15 (LIMA-LAD, VG- OM1, VG-OM2, VG-PDA)   . Cancer (HCC)    skin, melanoma  . Carotid artery disease ([place])    a. [place] 8/16: 1-39% bilateral ICA stenosis  . Cataract    removed  . Diastolic dysfunction     a. TTE 8/16: EF 55-60%, no RWMA, Gr1DD, calcified mitral annulus, mild biatrial enlargement  . Erectile dysfunction   . GERD (gastroesophageal reflux disease)   . History of elbow surgery   . History of hiatal hernia   . HLD (hyperlipidemia)   . HTN (hypertension)   . Inferior myocardial infarction ([place]) 10/01   stent RCA  . Postoperative wound infection 02/02/2015  . Reflux esophagitis   . Sleep apnea 2017   CPAP at night    Past Surgical History:  Procedure Laterality Date  . arm surgery  2010  . CARDIAC CATHETERIZATION  06/24/11  . CARDIAC CATHETERIZATION N/A 01/18/2015   Procedure: Left Heart Cath with coronary angiography;  Surgeon: Minna Merritts, MD;  Location: [facility];  Service: Cardiovascular;  Laterality: N/A;  . CARDIAC CATHETERIZATION N/A 01/18/2015   Procedure: Intravascular Pressure Wire/FFR Study;  Surgeon: [name], MD;  Location: [facility];  Service: Cardiovascular;  Laterality: N/A;  . CAROTID STENT  03/10/2011  . COLONOSCOPY  2010  . COLONOSCOPY  06/14/2014   Dr Hilarie Fredrickson  . CORONARY ARTERY BYPASS GRAFT N/A 01/24/2015   Procedure: CORONARY ARTERY BYPASS GRAFTING x 4 (LIMA-LAD, SVG-Int 1- Int 2, SVG-PD) ENDOSCOPIC GREATER SAPHENOUS VEIN HARVEST LEFT LEG;  Surgeon: Percell Miller  Maryruth Bun, MD;  Location: [facility];  Service: Open Heart Surgery;  Laterality: N/A;  . ESOPHAGOGASTRODUODENOSCOPY (EGD) WITH PROPOFOL N/A 04/24/2016   Procedure: ESOPHAGOGASTRODUODENOSCOPY (EGD) WITH PROPOFOL;  Surgeon: Jerene Bears, MD;  Location: [facility];  Service: Gastroenterology;  Laterality: N/A;  . EYE SURGERY     lasik 15 yrs. ago, cataracts removed - both eyes   . HAMMER TOE SURGERY     right toe  . LEFT HEART CATH AND CORONARY ANGIOGRAPHY Left 06/10/2017   Procedure: LEFT HEART CATH AND CORONARY ANGIOGRAPHY;  Surgeon: Minna Merritts, MD;  Location: [facility];  Service: Cardiovascular;  Laterality: Left;  . LOWER EXTREMITY ANGIOGRAPHY Left  01/04/2019   Procedure: LOWER EXTREMITY ANGIOGRAPHY;  Surgeon: Katha Cabal, MD;  Location: [facility];  Service: Cardiovascular;  Laterality: Left;  . LOWER EXTREMITY ANGIOGRAPHY Right 01/25/2019   Procedure: LOWER EXTREMITY ANGIOGRAPHY;  Surgeon: Katha Cabal, MD;  Location: [facility];  Service: Cardiovascular;  Laterality: Right;  . NASAL SINUS SURGERY  2008   septpolasty, bilateral turbinate reduction  . SHOULDER ARTHROSCOPY  2012  . TEE WITHOUT CARDIOVERSION N/A 01/24/2015   Procedure: TRANSESOPHAGEAL ECHOCARDIOGRAM (TEE);  Surgeon: [name], MD;  Location: [facility];  Service: Open Heart Surgery;  Laterality: N/A;  . [place]  . UPPER GI ENDOSCOPY  07/2014, 04-24-16   Dr Raquel James  . WRIST SURGERY  2011    Social History Social History   Tobacco Use  . Smoking status: Former Smoker    Packs/day: 2.50    Years: 40.00    Pack years: 100.00    Types: Cigarettes    Quit date: 03/24/2000    Years since quitting: 19.2  . Smokeless tobacco: Never Used  Substance Use Topics  . Alcohol use: Yes    Alcohol/week: 5.0 standard drinks    Types: 5 Shots of liquor per week    Comment: occ  . Drug use: No    Family History Family History  Problem Relation Age of Onset  . Hypertension Mother   . Heart disease Mother   . Hypertension Father   . Diabetes Father   . Heart disease Brother 61  . Cancer Paternal Grandfather   . Colon cancer Neg Hx   . Prostate cancer Neg Hx   . Bladder Cancer Neg Hx   . Kidney cancer Neg Hx   No family history of bleeding/clotting disorders, porphyria or autoimmune disease   No Known Allergies   REVIEW OF SYSTEMS (Negative unless checked)  Constitutional: [] Weight loss  [] Fever  [] Chills Cardiac: [] Chest pain   [] Chest pressure   [] Palpitations   [] Shortness of breath when laying flat   [] Shortness of breath at rest   [] Shortness of breath with exertion. Vascular:  [x] Pain in legs with walking    [x] Pain in legs at rest   [] Pain in legs when laying flat   [x] Claudication   [] Pain in feet when walking  [] Pain in feet at rest  [] Pain in feet when laying flat   [] History of DVT   [] Phlebitis   [] Swelling in legs   [] Varicose veins   [] Non-healing ulcers Pulmonary:   [] Uses home oxygen   [] Productive cough   [] Hemoptysis   [] Wheeze  [] COPD   [] Asthma Neurologic:  [] Dizziness  [] Blackouts   [] Seizures   [] History of stroke   [] History of TIA  [] Aphasia   [] Temporary blindness   [] Dysphagia   [] Weakness or numbness  in arms   [] Weakness or numbness in legs Musculoskeletal:  [] Arthritis   [] Joint swelling   [] Joint pain   [] Low back pain Hematologic:  [] Easy bruising  [] Easy bleeding   [] Hypercoagulable state   [] Anemic  [] Hepatitis Gastrointestinal:  [] Blood in stool   [] Vomiting blood  [] Gastroesophageal reflux/heartburn   [] Difficulty swallowing. Genitourinary:  [] Chronic kidney disease   [] Difficult urination  [] Frequent urination  [] Burning with urination   [] Blood in urine Skin:  [] Rashes   [] Ulcers   [] Wounds Psychological:  [] History of anxiety   []  History of major depression.  Physical Examination  There were no vitals filed for this visit. There is no height or weight on file to calculate BMI. Gen: WD/WN, NAD Head: [place]/AT, No temporalis wasting.  Ear/Nose/Throat: Hearing grossly intact, nares w/o erythema or drainage, oropharynx w/o Erythema/Exudate, Eyes: Sclera non-icteric, conjunctiva clear Neck: Supple, no nuchal rigidity.  No JVD.  Pulmonary:  Good air movement, no increased work of respiration or use of accessory muscles  Cardiac: RRR, normal S1, S2, no Murmurs, rubs or gallops. Vascular:  Vessel Right Left  Radial Palpable Palpable  Brachial Palpable Palpable  Carotid Palpable, without bruit Palpable, without bruit  Aorta Not palpable N/A  Femoral Not Palpable Palpable  Popliteal Not Palpable Not Palpable  PT Not Palpable Not Palpable  DP Not Palpable Not Palpable    Gastrointestinal: soft, non-tender/non-distended. No guarding/reflex. No masses, surgical incisions, or scars. Musculoskeletal: M/S 5/5 throughout.  No deformity or atrophy.  Trace edema Neurologic: Sensation grossly intact in extremities.  Symmetrical.  Speech is fluent. Motor exam as listed above. Psychiatric: Judgment intact, Mood & affect appropriate for pt's clinical situation. Dermatologic: No rashes or ulcers noted.  No cellulitis or open wounds. Lymph : No Cervical, Axillary, or Inguinal lymphadenopathy.      CBC Lab Results  Component Value Date   WBC 6.9 04/12/2019   HGB 15.5 04/12/2019   HCT 46.4 04/12/2019   MCV 90.1 04/12/2019   PLT 219 04/12/2019    BMET    Component Value Date/Time   NA 138 04/12/2019 1001   NA 138 01/15/2015 0956   K 4.4 04/12/2019 1001   CL 104 04/12/2019 1001   CO2 25 04/12/2019 1001   GLUCOSE 98 04/12/2019 1001   BUN 22 04/12/2019 1001   BUN 16 01/15/2015 0956   CREATININE 1.01 04/12/2019 1001   CALCIUM 9.8 04/12/2019 1001   GFRNONAA >60 04/12/2019 1001   GFRAA >60 04/12/2019 1001   CrCl cannot be calculated (Patient's most recent lab result is older than the maximum 21 days allowed.).  COAG Lab Results  Component Value Date   INR 1.1 04/12/2019   INR 0.94 06/04/2017   INR 1.34 01/24/2015    Radiology VASCULAR C-ARM IMAGES ONLY ([facility])  Result Date: 06/15/2019 Please refer to notes tab for details about interventional procedure. (Op Note)     Assessment/Plan 1. Atherosclerosis of native artery of both lower extremities with intermittent claudication (HCC)  Recommend:  The patient has evidence of severe atherosclerotic changes of both lower extremities associated with ulceration and tissue loss of the foot.  This represents a limb threatening ischemia and places the patient at the risk for limb loss.  Angiography has been performed and the situation is not ideal for intervention.  Given this finding open surgical  repair is recommended.   He should have a hybrid procedure of a right femoral endarterectomy with stenting of the SFA/pop artery.  Patient should undergo arterial reconstruction  of the lower extremity with the hope for limb salvage.  The risks and benefits as well as the alternative therapies was discussed in detail with the patient.  All questions were answered.  Patient agrees to proceed with bypass surgery.  The patient will follow up with me in the office after the procedure.    2. Coronary artery disease of native artery of native heart with stable angina pectoris ([place]) Continue cardiac and antihypertensive medications as already ordered and reviewed, no changes at this time.  Continue statin as ordered and reviewed, no changes at this time  Nitrates PRN for chest pain   3. Essential hypertension Continue antihypertensive medications as already ordered, these medications have been reviewed and there are no changes at this time.   4. Paroxysmal atrial fibrillation (HCC) Continue antiarrhythmia medications as already ordered, these medications have been reviewed and there are no changes at this time.  Continue anticoagulation as ordered by Cardiology Service  Hortencia Pilar, MD  06/15/2019 7:44 AM

## 2019-06-16 DIAGNOSIS — I70229 Atherosclerosis of native arteries of extremities with rest pain, unspecified extremity: Secondary | ICD-10-CM

## 2019-06-16 LAB — MAGNESIUM: Magnesium: 2.1 mg/dL (ref 1.7–2.4)

## 2019-06-16 LAB — SURGICAL PATHOLOGY

## 2019-06-16 LAB — CBC
HCT: 40.8 % (ref 39.0–52.0)
Hemoglobin: 13.9 g/dL (ref 13.0–17.0)
MCH: 31 pg (ref 26.0–34.0)
MCHC: 34.1 g/dL (ref 30.0–36.0)
MCV: 90.9 fL (ref 80.0–100.0)
Platelets: 202 10*3/uL (ref 150–400)
RBC: 4.49 MIL/uL (ref 4.22–5.81)
RDW: 12.5 % (ref 11.5–15.5)
WBC: 13.2 10*3/uL — ABNORMAL HIGH (ref 4.0–10.5)
nRBC: 0 % (ref 0.0–0.2)

## 2019-06-16 LAB — BASIC METABOLIC PANEL
Anion gap: 9 (ref 5–15)
BUN: 20 mg/dL (ref 8–23)
CO2: 24 mmol/L (ref 22–32)
Calcium: 8.9 mg/dL (ref 8.9–10.3)
Chloride: 105 mmol/L (ref 98–111)
Creatinine, Ser: 1.07 mg/dL (ref 0.61–1.24)
GFR calc Af Amer: >60 mL/min (ref >60)
GFR calc non Af Amer: >60 mL/min (ref >60)
Glucose, Bld: 116 mg/dL — ABNORMAL HIGH (ref 70–99)
Potassium: 4 mmol/L (ref 3.5–5.1)
Sodium: 138 mmol/L (ref 135–145)

## 2019-06-16 MED ORDER — HYDROMORPHONE HCL 1 MG/ML IJ SOLN
INTRAMUSCULAR | Status: AC
  Filled 2019-06-16: qty 0.5

## 2019-06-16 MED ORDER — OXYCODONE HCL 5 MG PO TABS: 5.0000 mg | ORAL_TABLET | Freq: Four times a day (QID) | ORAL | 0 refills | Status: AC | PRN

## 2019-06-16 MED ORDER — OXYCODONE HCL 5 MG PO TABS
ORAL_TABLET | ORAL | Status: AC
  Administered 2019-06-16: 10 mg via ORAL
  Filled 2019-06-16: qty 2

## 2019-06-16 MED ORDER — HYDROMORPHONE HCL 1 MG/ML IJ SOLN
INTRAMUSCULAR | Status: AC
  Filled 2019-06-16: qty 1

## 2019-06-16 MED ORDER — TRAMADOL HCL 50 MG PO TABS: 50.0000 mg | ORAL_TABLET | Freq: Four times a day (QID) | ORAL | 0 refills | Status: DC | PRN

## 2019-06-16 MED ORDER — CEFAZOLIN SODIUM-DEXTROSE 1-4 GM/50ML-% IV SOLN
INTRAVENOUS | Status: AC
  Administered 2019-06-16: 1 g via INTRAVENOUS
  Filled 2019-06-16: qty 50

## 2019-06-16 MED ORDER — HYDROMORPHONE HCL 1 MG/ML IJ SOLN
0.5000 mg | INTRAMUSCULAR | Status: DC | PRN
  Administered 2019-06-16: 0.5 mg via INTRAVENOUS

## 2019-06-16 NOTE — Discharge Instructions (Signed)
Vascular Surgery Discharge Instructions  1) You may remove your dressing and shower as of Saturday 06/18/19. Gently clean the area with soap and pat dry. You do not need to re-apply a new dressing to the area. Please avoid tape to the incision. Do not remove the glue on top of your incision. 2) Pain medication has been sent electronically to your pharmacy. No driving or drinking alcohol while taking pain medication.  Tramadol for moderate pain Oxycodone for severe pain 3) No driving until you are cleared at your first postoperative visit in our office. 4) No strenuous activity. No heavy lifting greater than 10 pounds until cleared during your first postoperative visit.

## 2019-06-16 NOTE — Telephone Encounter (Signed)
Reminder entered in epic to check with pt in 3 months.

## 2019-06-16 NOTE — Progress Notes (Signed)
Nsg Discharge Note  Admit Date:  06/15/2019 Discharge date: 06/16/2019   Randall Mo. to be D/C'd Home per MD order.  AVS completed.  Copy for chart, and copy for patient signed, and dated. Patient/caregiver able to verbalize understanding.  Discharge Medication: Allergies as of 06/16/2019   No Known Allergies     Medication List    TAKE these medications   acetaminophen 500 MG tablet Commonly known as: TYLENOL Take 1,000 mg by mouth at bedtime.   aspirin 81 MG EC tablet Take 81 mg by mouth daily.   CALCIUM 1200+D3 PO Take 1 tablet by mouth daily.   clopidogrel 75 MG tablet Commonly known as: Plavix Take 1 tablet (75 mg total) by mouth daily. What changed: when to take this   ibuprofen 200 MG tablet Commonly known as: ADVIL Take 400 mg by mouth every 6 (six) hours as needed for headache or moderate pain.   lisinopril 5 MG tablet Commonly known as: ZESTRIL Take 1 tablet (5 mg total) by mouth daily. What changed: when to take this   Lubricant Eye Drops 0.4-0.3 % Soln Generic drug: Polyethyl Glycol-Propyl Glycol Place 1-2 drops into both eyes 3 (three) times daily as needed (burning eyes.).   metoprolol succinate 25 MG 24 hr tablet Commonly known as: TOPROL-XL Take 0.5 tablets (12.5 mg total) by mouth daily. What changed: when to take this   nitroGLYCERIN 0.4 MG SL tablet Commonly known as: NITROSTAT Place 1 tablet (0.4 mg total) under the tongue every 5 (five) minutes as needed for chest pain.   NONFORMULARY OR COMPOUNDED ITEM Trimix (30/1/10)-(Pap/Phent/PGE)  Test Dose 3 21ml vials  Qty #3 Refills 0  [place] [phone] Fax [phone]   oxyCODONE 5 MG immediate release tablet Commonly known as: Oxy IR/ROXICODONE Take 1 tablet (5 mg total) by mouth every 6 (six) hours as needed for up to 7 days for severe pain or breakthrough pain.   pantoprazole 40 MG tablet Commonly known as: Protonix Take 1 tablet (40 mg total) by mouth  daily. What changed: when to take this   simvastatin 40 MG tablet Commonly known as: ZOCOR Take 1 tablet (40 mg total) by mouth at bedtime.   tamsulosin 0.4 MG Caps capsule Commonly known as: FLOMAX TAKE 1 CAPSULE BY MOUTH EVERY DAY AFTER SUPPER   traMADol 50 MG tablet Commonly known as: ULTRAM Take 1 tablet (50 mg total) by mouth every 6 (six) hours as needed for moderate pain. What changed:   how much to take  when to take this  reasons to take this   vitamin B-12 500 MCG tablet Commonly known as: CYANOCOBALAMIN Take 500 mcg by mouth daily.   vitamin C 1000 MG tablet Take 1,000 mg by mouth daily.   Vitamin D3 50 MCG (2000 UT) Tabs Take 2,000 Units by mouth daily.   vitamin E 1000 UNIT capsule Take 1,000 Units by mouth daily. Vitamin E 450 mg   Zinc 50 MG Tabs Take 50 mg by mouth daily.            Durable Medical Equipment  (From admission, onward)         Start     Ordered   06/16/19 1500  DME Walker rolling  Once    Question Answer Comment  Walker: With 5 Inch Wheels   Patient needs a walker to treat with the following condition PAD (peripheral artery disease) ([place])      06/16/19 1501  Discharge Assessment: Vitals:   06/16/19 1356 06/16/19 1640  BP: 102/63 137/69  Pulse: 74 79  Resp: 16 16  Temp:  99 F (37.2 C)  SpO2: 90% 96%   Skin clean, dry and intact without evidence of skin break down, no evidence of skin tears noted. IV catheter discontinued intact. Site without signs and symptoms of complications - no redness or edema noted at insertion site, patient denies c/o pain - only slight tenderness at site.  Dressing with slight pressure applied.  D/c Instructions-Education: Discharge instructions given to patient/family with verbalized understanding. D/c education completed with patient/family including follow up instructions, medication list, d/c activities limitations if indicated, with other d/c instructions as indicated by MD -  patient able to verbalize understanding, all questions fully answered. Patient instructed to return to ED, call 911, or call MD for any changes in condition.  Patient escorted via [place], and D/C home via private auto.  Tresa Endo, RN 06/16/2019 5:26 PM

## 2019-06-16 NOTE — Evaluation (Signed)
Physical Therapy Evaluation Patient Details Name: Randall Ali. MRN: [id number] DOB: 09-04-1940 Today's Date: 06/16/2019   History of Present Illness  79 y/o male who underwent b/l LE angioplasties / stent placements 06/15/19.  Clinical Impression  Pt did well with PT exam and was able to ambulate >100 ft in post-op with walker (~50 ft w/o AD, though less confident/more guarded).  Pt did surprisingly well with b/l LE strength testing with some understandable hesitancy with SLRs but overall functional and age appropriate t/o.  Pt slow and methodical getting to EOB but did not need direct assist and showed good overall safety and confidence.  Pt does not have steps to enter his home and feels confident that family will be able to be around, even 24/7 if needed.  Pt safe to return home from PT perspective.    Follow Up Recommendations Supervision - Intermittent;Follow surgeon's recommendation for DC plan and follow-up therapies(pt educated on regular walking at home (at least BID))    Equipment Recommendations  Rolling walker with 5" wheels    Recommendations for Other Services       Precautions / Restrictions Precautions Precautions: Fall Restrictions Weight Bearing Restrictions: No      Mobility  Bed Mobility Overal bed mobility: Modified Independent             General bed mobility comments: Pt needed some extra time/effort to get to EOB but ultimately did relatively well and did not need direct assist  Transfers Overall transfer level: Modified independent Equipment used: Rolling walker (2 wheeled)             General transfer comment: Pt did well getting to standing from EOB, some cuing to use UEs appropriately but did not need direct assist.  Ambulation/Gait Ambulation/Gait assistance: Modified independent (Device/Increase time) Gait Distance (Feet): 125 Feet Assistive device: Rolling walker (2 wheeled);None       General Gait Details: Pt was able to walk  ~75 ft with walker and then an additional 50 ft with no UE assist.  He did not have any overt LOBs w/o the AD but was more confident and less guarded while using it with overall safer gait.  Stairs            Wheelchair Mobility    Modified [place] (Stroke Patients Only)       Balance Overall balance assessment: Modified Independent                                           Pertinent Vitals/Pain Pain Assessment: 0-10 Pain Score: 4  Pain Location: b/l groin/upper thigh pain, R>L    Home Living Family/patient expects to be discharged to:: Private residence Living Arrangements: Other relatives Available Help at Discharge: Family(reports children could give 24/7 assist if needed) Type of Home: House Home Access: Level entry(reports a few inch step up to enter)       Home Equipment: None      Prior Function Level of Independence: Independent         Comments: Pt reports that he goes to the gym almost daily, drives and is able to be active     Hand Dominance        Extremity/Trunk Assessment   Upper Extremity Assessment Upper Extremity Assessment: Overall WFL for tasks assessed    Lower Extremity Assessment Lower Extremity Assessment: Overall WFL for tasks assessed;Generalized  weakness(minimal weakness with SLRs but functional t/o b/l)       Communication   Communication: No difficulties  Cognition Arousal/Alertness: Awake/alert Behavior During Therapy: WFL for tasks assessed/performed Overall Cognitive Status: Within Functional Limits for tasks assessed                                 General Comments: Pt pleasant and motivated t/o session      General Comments      Exercises     Assessment/Plan    PT Assessment Patient needs continued PT services  PT Problem List Decreased strength;Decreased range of motion;Decreased activity tolerance;Decreased balance;Decreased mobility;Decreased coordination;Decreased  cognition;Decreased knowledge of use of DME;Decreased safety awareness;Pain;Cardiopulmonary status limiting activity       PT Treatment Interventions DME instruction;Functional mobility training;Therapeutic activities;Therapeutic exercise;Gait training;Balance training;Neuromuscular re-education;Patient/family education    PT Goals (Current goals can be found in the Care Plan section)  Acute Rehab PT Goals Patient Stated Goal: go home PT Goal Formulation: With patient Time For Goal Achievement: 06/30/19 Potential to Achieve Goals: Good    Frequency Min 2X/week   Barriers to discharge        Co-evaluation               AM-PAC PT "6 Clicks" Mobility  Outcome Measure Help needed turning from your back to your side while in a flat bed without using bedrails?: None Help needed moving from lying on your back to sitting on the side of a flat bed without using bedrails?: None Help needed moving to and from a bed to a chair (including a wheelchair)?: A Little Help needed standing up from a chair using your arms (e.g., wheelchair or bedside chair)?: A Little Help needed to walk in hospital room?: A Little Help needed climbing 3-5 steps with a railing? : A Little 6 Click Score: 20    End of Session Equipment Utilized During Treatment: Gait belt Activity Tolerance: Patient tolerated treatment well Patient left: in bed;with nursing/sitter in room;with call bell/phone within reach Nurse Communication: Mobility status(need walker orders from MD) PT Visit Diagnosis: Other abnormalities of gait and mobility (R26.89);Muscle weakness (generalized) (M62.81);Pain;Difficulty in walking, not elsewhere classified (R26.2) Pain - Right/Left: Right Pain - part of body: Hip    Time: 1410-1435 PT Time Calculation (min) (ACUTE ONLY): 25 min   Charges:   PT Evaluation $PT Eval Low Complexity: 1 Low PT Treatments $Gait Training: 8-22 mins        Kreg Shropshire, DPT 06/16/2019, 4:38  PM

## 2019-06-16 NOTE — Care Management (Addendum)
RN CM: Incoming call from bedside nurse states patient is ready for discharge. PT has provided walker in the room and patient has no other needs at discharge. Reviewed with PT and confirmed no other needs.

## 2019-06-16 NOTE — Telephone Encounter (Signed)
yes

## 2019-06-17 ENCOUNTER — Telehealth (INDEPENDENT_AMBULATORY_CARE_PROVIDER_SITE_OTHER): Payer: Self-pay

## 2019-06-17 NOTE — Discharge Summary (Signed)
[facility]    Discharge Summary  Patient ID:  Randall Ali. MRN: [id number] DOB/AGE: [birth year] 79 y.o.  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 Allergy    seasonal  . Arthritis    all over- in general   . CAD (coronary artery disease)    a. inferior wall MI 10/01 s/p PCI/DES to RCA; b. Myoview 3/16 neg for ischemia; c. LHC 8/16: ostLAD 80%, OM1 70%, OM2 70% x 2 lesions, mRCA 30%, dRCA 70% s/p 4-V CABG 01/24/15 (LIMA-LAD, VG- OM1, VG-OM2, VG-PDA)   . Cancer (HCC)    skin, melanoma  . Carotid artery disease ([place])    a. [place] 8/16: 1-39% bilateral ICA stenosis  . Cataract    removed  . Diastolic dysfunction    a. TTE 8/16: EF 55-60%, no RWMA, Gr1DD, calcified mitral annulus, mild biatrial enlargement  . Erectile dysfunction   . GERD (gastroesophageal reflux disease)   . History of elbow surgery   . History of hiatal hernia   . HLD (hyperlipidemia)   . HTN (hypertension)   . Inferior myocardial infarction ([place]) 10/01   stent RCA  . Postoperative wound infection 02/02/2015  . Reflux esophagitis   . Sleep apnea 2017   CPAP at night   Procedure (06/15/19): 1. Right common femoral, profunda femoris, and superficial femoral artery endarterectomies and patch angioplasty 2.   Right lower extremity angiogram 3.   Angioplasty to the right tibioperoneal trunk with 5 mm diameter Lutonix drug-coated angioplasty balloon 4.  Stent placement to the right tibioperoneal artery with 6 mm diameter by 4 cm length life  stent 5.  Stent placement to the right SFA and popliteal artery x2 with 7 mm diameter by 20 cm length and 7 mm diameter by 15 cm length life stent postdilated with 7 mm diameter with Lutonix drug-coated angioplasty balloons 6.  Fogarty embolectomy balloon to the right SFA and popliteal arteries for thrombus after above procedures 7.  Ultrasound guidance for vascular access to the left femoral artery 8.  Life star stent placement to the left iliac arteries from the distal left common iliac artery down to the mid external iliac artery with a 9 mm diameter by 6 cm length stent postdilated with an 8 mm diameter Lutonix drug-coated angioplasty balloon 9.  Life star stent placement to the right external iliac artery with a 9 mm diameter by 4 cm length stent postdilated with an 8 mm diameter Lutonix drug-coated angioplasty balloon 10.  Catheter placement into the right common femoral artery for imaging following percutaneous intervention and subsequent thrombectomy with right lower extremity angiogram from the left femoral approach.  Discharged Condition: Good  Hospital Course / HPI:  Randall Ali. is a 79 y.o. male is S/P:  Procedure(s): ENDARTERECTOMY FEMORAL INSERTION OF ILIAC STENT ( STENTING OF SFA/POP ARTERY ) EMBOLECTOMY  Patient presents with rest pain disabling claudication symptoms with symptoms a little worse on the right than the left.  Right femoral endarterectomy is planned to try to improve perfusion.  He also had known significant iliac disease bilaterally and right SFA disease that concomitant  intervention was planned on.  The risks and benefits as well as alternative therapies including intervention were reviewed in detail all questions were answered the patient agrees to proceed with surgery. On 06/15/19, the patient underwent:  1. Right common femoral, profunda femoris, and superficial femoral artery endarterectomies and patch angioplasty 2.   Right lower extremity  angiogram 3.   Angioplasty to the right tibioperoneal trunk with 5 mm diameter Lutonix drug-coated angioplasty balloon 4.  Stent placement to the right tibioperoneal artery with 6 mm diameter by 4 cm length life stent 5.  Stent placement to the right SFA and popliteal artery x2 with 7 mm diameter by 20 cm length and 7 mm diameter by 15 cm length life stent postdilated with 7 mm diameter with Lutonix drug-coated angioplasty balloons 6.  Fogarty embolectomy balloon to the right SFA and popliteal arteries for thrombus after above procedures 7.  Ultrasound guidance for vascular access to the left femoral artery 8.  Life star stent placement to the left iliac arteries from the distal left common iliac artery down to the mid external iliac artery with a 9 mm diameter by 6 cm length stent postdilated with an 8 mm diameter Lutonix drug-coated angioplasty balloon 9.  Life star stent placement to the right external iliac artery with a 9 mm diameter by 4 cm length stent postdilated with an 8 mm diameter Lutonix drug-coated angioplasty balloon 10.  Catheter placement into the right common femoral artery for imaging following percutaneous intervention and subsequent thrombectomy with right lower extremity angiogram from the left femoral approach.  He tolerated the procedure well was transferred to the recovery room without complication.  [name] night of surgery was unremarkable.  During the patient's brief inpatient stay, his diet was advanced, his Foley was removed, his pain was controlled with the use of p.o. pain medication he was ambulating independently.  The patient was evaluated by physical therapy and the day of discharge is deemed safe to discharge home.  On day of discharge, the patient was afebrile with stable vital signs making good urine.  Extubated: POD # 0  Physical exam:  A&Ox3, NAD CV: RRR Pulmonary: Clear to auscultation bilaterally Abdomen: Soft, nontender, nondistended, positive bowel  sounds Right groin:  Or dressing: Intact, clean and dry.  No drainage or hematoma. Left groin:  Endovascular access: Intact, clean and dry.  No drainage or hematoma. Right lower extremity: Thigh soft.  Calf soft.  Extremities warm distally to toes.  Hard to palpate pedal pulses however there is good capillary refill. Left lower extremity: Thigh soft.  Calf soft.  Extremities warm distally to toes.  Hard to palpate pedal pulses however there is good capillary refill.  Labs as below  Complications: None  Consults:  Physical Therapy  Significant Diagnostic Studies: CBC Lab Results  Component Value Date   WBC 13.2 (H) 06/16/2019   HGB 13.9 06/16/2019   HCT 40.8 06/16/2019   MCV 90.9 06/16/2019   PLT 202 06/16/2019   BMET    Component Value Date/Time   NA 138 06/16/2019 1126   NA 138 01/15/2015 0956   K 4.0 06/16/2019 1126   CL 105 06/16/2019 1126   CO2 24 06/16/2019 1126   GLUCOSE 116 (H) 06/16/2019 1126   BUN 20 06/16/2019 1126   BUN 16 01/15/2015 0956   CREATININE 1.07 06/16/2019 1126   CALCIUM 8.9 06/16/2019 1126   GFRNONAA >60 06/16/2019 1126   GFRAA >60 06/16/2019 1126   COAG Lab Results  Component  Value Date   INR 1.1 04/12/2019   INR 0.94 06/04/2017   INR 1.34 01/24/2015   Disposition:  Discharge to :Home  Allergies as of 06/16/2019   No Known Allergies     Medication List    TAKE these medications   acetaminophen 500 MG tablet Commonly known as: TYLENOL Take 1,000 mg by mouth at bedtime.   aspirin 81 MG EC tablet Take 81 mg by mouth daily.   CALCIUM 1200+D3 PO Take 1 tablet by mouth daily.   clopidogrel 75 MG tablet Commonly known as: Plavix Take 1 tablet (75 mg total) by mouth daily. What changed: when to take this   ibuprofen 200 MG tablet Commonly known as: ADVIL Take 400 mg by mouth every 6 (six) hours as needed for headache or moderate pain.   lisinopril 5 MG tablet Commonly known as: ZESTRIL Take 1 tablet (5 mg total) by mouth  daily. What changed: when to take this   Lubricant Eye Drops 0.4-0.3 % Soln Generic drug: Polyethyl Glycol-Propyl Glycol Place 1-2 drops into both eyes 3 (three) times daily as needed (burning eyes.).   metoprolol succinate 25 MG 24 hr tablet Commonly known as: TOPROL-XL Take 0.5 tablets (12.5 mg total) by mouth daily. What changed: when to take this   nitroGLYCERIN 0.4 MG SL tablet Commonly known as: NITROSTAT Place 1 tablet (0.4 mg total) under the tongue every 5 (five) minutes as needed for chest pain.   NONFORMULARY OR COMPOUNDED ITEM Trimix (30/1/10)-(Pap/Phent/PGE)  Test Dose 3 30ml vials  Qty #3 Refills 0  [place] [phone] Fax [phone]   oxyCODONE 5 MG immediate release tablet Commonly known as: Oxy IR/ROXICODONE Take 1 tablet (5 mg total) by mouth every 6 (six) hours as needed for up to 7 days for severe pain or breakthrough pain.   pantoprazole 40 MG tablet Commonly known as: Protonix Take 1 tablet (40 mg total) by mouth daily. What changed: when to take this   simvastatin 40 MG tablet Commonly known as: ZOCOR Take 1 tablet (40 mg total) by mouth at bedtime.   tamsulosin 0.4 MG Caps capsule Commonly known as: FLOMAX TAKE 1 CAPSULE BY MOUTH EVERY DAY AFTER SUPPER   traMADol 50 MG tablet Commonly known as: ULTRAM Take 1 tablet (50 mg total) by mouth every 6 (six) hours as needed for moderate pain. What changed:   how much to take  when to take this  reasons to take this   vitamin B-12 500 MCG tablet Commonly known as: CYANOCOBALAMIN Take 500 mcg by mouth daily.   vitamin C 1000 MG tablet Take 1,000 mg by mouth daily.   Vitamin D3 50 MCG (2000 UT) Tabs Take 2,000 Units by mouth daily.   vitamin E 1000 UNIT capsule Take 1,000 Units by mouth daily. Vitamin E 450 mg   Zinc 50 MG Tabs Take 50 mg by mouth daily.      Verbal and written Discharge instructions given to the patient. Wound care per Discharge AVS Follow-up  Information    Kris Hartmann, NP Follow up on 06/23/2019.   Specialty: Vascular Surgery Why: First post-op check. Incision check. No studies. 1:30 PM Contact information: [address] [place] [address] [phone]          Signed: Sela Hua, PA-C  06/17/2019, 9:43 AM

## 2019-06-17 NOTE — Anesthesia Postprocedure Evaluation (Signed)
Anesthesia Post Note  Patient: Randall Ali.  Procedure(s) Performed: ENDARTERECTOMY FEMORAL (Right ) INSERTION OF ILIAC STENT ( STENTING OF SFA/POP ARTERY ) (Right ) EMBOLECTOMY  Patient location during evaluation: PACU Anesthesia Type: General Level of consciousness: awake and alert and oriented Pain management: pain level controlled Vital Signs Assessment: post-procedure vital signs reviewed and stable Respiratory status: spontaneous breathing, nonlabored ventilation and respiratory function stable Cardiovascular status: blood pressure returned to baseline and stable Postop Assessment: no signs of nausea or vomiting Anesthetic complications: no     Last Vitals:  Vitals:   06/16/19 1356 06/16/19 1640  BP: 102/63 137/69  Pulse: 74 79  Resp: 16 16  Temp:  37.2 C  SpO2: 90% 96%    Last Pain:  Vitals:   06/16/19 1640  TempSrc: Temporal  PainSc: 0-No pain                 Zaria Taha

## 2019-06-17 NOTE — Telephone Encounter (Signed)
Patient called wanting to know should he apply a bandage to his incision site after his surgery. I directed the patient to his discharge summary and page 8 of the summary, and explained that per the summary he does not need to reapply a dressing after he removes the dressing on 06/18/19.

## 2019-06-23 ENCOUNTER — Encounter (INDEPENDENT_AMBULATORY_CARE_PROVIDER_SITE_OTHER): Payer: Self-pay | Admitting: Nurse Practitioner

## 2019-06-23 ENCOUNTER — Other Ambulatory Visit: Payer: Self-pay

## 2019-06-23 ENCOUNTER — Ambulatory Visit (INDEPENDENT_AMBULATORY_CARE_PROVIDER_SITE_OTHER): Payer: PPO | Admitting: Nurse Practitioner

## 2019-06-23 VITALS — BP 113/69 | HR 74 | Resp 16 | Ht 68.0 in | Wt 215.0 lb

## 2019-06-23 DIAGNOSIS — I70229 Atherosclerosis of native arteries of extremities with rest pain, unspecified extremity: Secondary | ICD-10-CM

## 2019-06-23 NOTE — Progress Notes (Signed)
SUBJECTIVE:  Patient ID: Randall Lofts., male    DOB: 29-Mar-1941, 79 y.o.   MRN: [id number] Chief Complaint  Patient presents with  . Follow-up    1 WK [facility] post op CK  Femoral Endarterectomy    HPI  Randall Moure. is a 79 y.o. male that presents today after femoral endarterectomy on 06/14/2018.  Patient had extensive intervention done including:  1. Right common femoral, superficial femoral and profunda femoris endarterectomy with Cormatrix patch angioplasty 2. Open angioplasty and stent placement right tibioperoneal trunk with a 6 mm x 4 cm life stent postdilated to 5 mm with Lutonix drug-eluting balloon. 3. Open angioplasty and stent placement right superficial femoral and popliteal arteries 4. Thromboembolectomy with a #5 [place]arty right popliteal and superficial femoral arteries 5. Percutaneous transluminal angioplasty and stent placement left common iliac artery extending into the external iliac artery with a 9 mm x 40 mm life star stent postdilated to 8 mm with a Lutonix drug-eluting balloon. 6. Open transluminal angioplasty and stent placement right distal common iliac extending into the external iliac artery with a 9 mm x 40 mm life star stent postdilated to 8 mm with a Lutonix drug-eluting balloon 7. Catheter placement into the right common femoral artery from the left common femoral sheath for angiography of the right lower extremity status post thrombectomy  Today for surveillance and the patient states that he had some pretty severe swelling of his right lower extremity however this time it is much more manageable but still noticeably swollen.  He states that he is mostly able to walk without issue but does endorse having some weakness.  He states that he does have some pain in the groin occasionally but nothing that he is unable to tolerate.  He denies any drainage from the wound including blood or purulent material.  The patient is eager to go back to everyday activities.   Otherwise he states he feels pretty well.  Past Medical History:  Diagnosis Date  . Allergy    seasonal  . Arthritis    all over- in general   . CAD (coronary artery disease)    a. inferior wall MI 10/01 s/p PCI/DES to RCA; b. Myoview 3/16 neg for ischemia; c. LHC 8/16: ostLAD 80%, OM1 70%, OM2 70% x 2 lesions, mRCA 30%, dRCA 70% s/p 4-V CABG 01/24/15 (LIMA-LAD, VG- OM1, VG-OM2, VG-PDA)   . Cancer (HCC)    skin, melanoma  . Carotid artery disease ([place])    a. [place] 8/16: 1-39% bilateral ICA stenosis  . Cataract    removed  . Diastolic dysfunction    a. TTE 8/16: EF 55-60%, no RWMA, Gr1DD, calcified mitral annulus, mild biatrial enlargement  . Erectile dysfunction   . GERD (gastroesophageal reflux disease)   . History of elbow surgery   . History of hiatal hernia   . HLD (hyperlipidemia)   . HTN (hypertension)   . Inferior myocardial infarction ([place]) 10/01   stent RCA  . Postoperative wound infection 02/02/2015  . Reflux esophagitis   . Sleep apnea 2017   CPAP at night    Past Surgical History:  Procedure Laterality Date  . arm surgery  2010  . CARDIAC CATHETERIZATION  06/24/11  . CARDIAC CATHETERIZATION N/A 01/18/2015   Procedure: Left Heart Cath with coronary angiography;  Surgeon: Minna Merritts, MD;  Location: [facility] [facility];  Service: Cardiovascular;  Laterality: N/A;  . CARDIAC CATHETERIZATION N/A 01/18/2015   Procedure: Intravascular Pressure Wire/FFR  Study;  Surgeon: [name], MD;  Location: [place] [facility];  Service: Cardiovascular;  Laterality: N/A;  . CAROTID STENT  03/10/2011  . COLONOSCOPY  2010  . COLONOSCOPY  06/14/2014   Dr Hilarie Fredrickson  . CORONARY ARTERY BYPASS GRAFT N/A 01/24/2015   Procedure: CORONARY ARTERY BYPASS GRAFTING x 4 (LIMA-LAD, SVG-Int 1- Int 2, SVG-PD) ENDOSCOPIC GREATER SAPHENOUS VEIN HARVEST LEFT LEG;  Surgeon: [name], MD;  Location: [facility];  Service: Open Heart Surgery;  Laterality: N/A;  . EMBOLECTOMY  06/15/2019    Procedure: EMBOLECTOMY;  Surgeon: Katha Cabal, MD;  Location: [facility];  Service: Vascular;;  right superficial femoral artery  . ENDARTERECTOMY FEMORAL Right 06/15/2019   Procedure: ENDARTERECTOMY FEMORAL;  Surgeon: Katha Cabal, MD;  Location: [facility];  Service: Vascular;  Laterality: Right;  common femoral profunda femoris superficial femoral  . ESOPHAGOGASTRODUODENOSCOPY (EGD) WITH PROPOFOL N/A 04/24/2016   Procedure: ESOPHAGOGASTRODUODENOSCOPY (EGD) WITH PROPOFOL;  Surgeon: Jerene Bears, MD;  Location: [facility];  Service: Gastroenterology;  Laterality: N/A;  . EYE SURGERY     lasik 15 yrs. ago, cataracts removed - both eyes   . HAMMER TOE SURGERY     right toe  . INSERTION OF ILIAC STENT Right 06/15/2019   Procedure: INSERTION OF ILIAC STENT ( STENTING OF SFA/POP ARTERY );  Surgeon: Katha Cabal, MD;  Location: [facility];  Service: Vascular;  Laterality: Right;  angioplpasty and stent placement: right superficial femoral right tibiopopliteal trunk bilateral common iliac arteries  . LEFT HEART CATH AND CORONARY ANGIOGRAPHY Left 06/10/2017   Procedure: LEFT HEART CATH AND CORONARY ANGIOGRAPHY;  Surgeon: Minna Merritts, MD;  Location: [place];  Service: Cardiovascular;  Laterality: Left;  . LOWER EXTREMITY ANGIOGRAPHY Left 01/04/2019   Procedure: LOWER EXTREMITY ANGIOGRAPHY;  Surgeon: Katha Cabal, MD;  Location: [facility];  Service: Cardiovascular;  Laterality: Left;  . LOWER EXTREMITY ANGIOGRAPHY Right 01/25/2019   Procedure: LOWER EXTREMITY ANGIOGRAPHY;  Surgeon: Katha Cabal, MD;  Location: [place] [facility];  Service: Cardiovascular;  Laterality: Right;  . NASAL SINUS SURGERY  2008   septpolasty, bilateral turbinate reduction  . SHOULDER ARTHROSCOPY  2012  . TEE WITHOUT CARDIOVERSION N/A 01/24/2015   Procedure: TRANSESOPHAGEAL ECHOCARDIOGRAM (TEE);  Surgeon: [name], MD;  Location: [facility];  Service: Open Heart Surgery;   Laterality: N/A;  . [place]  . UPPER GI ENDOSCOPY  07/2014, 04-24-16   Dr Raquel James  . WRIST SURGERY  2011    Social History   Socioeconomic History  . Marital status: Single    Spouse name: Not on file  . Number of children: Not on file  . Years of education: 5  . Highest education level: Not on file  Occupational History  . Occupation: retired    Comment: Agricultural consultant  Tobacco Use  . Smoking status: Former Smoker    Packs/day: 2.50    Years: 40.00    Pack years: 100.00    Types: Cigarettes    Quit date: 03/24/2000    Years since quitting: 19.2  . Smokeless tobacco: Never Used  Substance and Sexual Activity  . Alcohol use: Yes    Alcohol/week: 5.0 standard drinks    Types: 5 Shots of liquor per week    Comment: occ  . Drug use: No  . Sexual activity: Yes  Other Topics Concern  . Not on file  Social History Narrative   Singled; lives  with son and dog    Occ: retired, back part time at [employer];    Activity: gym 4-5d/wk   Diet: good water, fruits/vegetables daily   Caffeine Use-yes   Social Determinants of Health   Financial Resource Strain: Low Risk   . Difficulty of Paying Living Expenses: Not hard at all  Food Insecurity: No Food Insecurity  . Worried About Charity fundraiser in the Last Year: Never true  . Ran Out of Food in the Last Year: Never true  Transportation Needs: No Transportation Needs  . Lack of Transportation (Medical): No  . Lack of Transportation (Non-Medical): No  Physical Activity: Sufficiently Active  . Days of Exercise per Week: 5 days  . Minutes of Exercise per Session: 60 min  Stress: No Stress Concern Present  . Feeling of Stress : Not at all  Social Connections:   . Frequency of Communication with Friends and Family: Not on file  . Frequency of Social Gatherings with Friends and Family: Not on file  . Attends Religious Services: Not on file  . Active Member of Clubs or Organizations: Not on file  . Attends Theatre manager Meetings: Not on file  . Marital Status: Not on file  Intimate Partner Violence: Not At Risk  . Fear of Current or Ex-Partner: No  . Emotionally Abused: No  . Physically Abused: No  . Sexually Abused: No    Family History  Problem Relation Age of Onset  . Hypertension Mother   . Heart disease Mother   . Hypertension Father   . Diabetes Father   . Heart disease Brother 48  . Cancer Paternal Grandfather   . Colon cancer Neg Hx   . Prostate cancer Neg Hx   . Bladder Cancer Neg Hx   . Kidney cancer Neg Hx     No Known Allergies   Review of Systems   Review of Systems: Negative Unless Checked Constitutional: [] Weight loss  [] Fever  [] Chills Cardiac: [] Chest pain   []  Atrial Fibrillation  [] Palpitations   [] Shortness of breath when laying flat   [] Shortness of breath with exertion. [] Shortness of breath at rest Vascular:  [] Pain in legs with walking   [] Pain in legs with standing [] Pain in legs when laying flat   [x] Claudication    [] Pain in feet when laying flat    [] History of DVT   [] Phlebitis   [x] Swelling in legs   [] Varicose veins   [] Non-healing ulcers Pulmonary:   [] Uses home oxygen   [] Productive cough   [] Hemoptysis   [] Wheeze  [] COPD   [] Asthma Neurologic:  [] Dizziness   [] Seizures  [] Blackouts [] History of stroke   [] History of TIA  [] Aphasia   [] Temporary Blindness   [] Weakness or numbness in arm   [x] Weakness or numbness in leg Musculoskeletal:   [] Joint swelling   [] Joint pain   [] Low back pain  []  History of Knee Replacement [] Arthritis [] back Surgeries  []  Spinal Stenosis    Hematologic:  [] Easy bruising  [] Easy bleeding   [] Hypercoagulable state   [] Anemic Gastrointestinal:  [] Diarrhea   [] Vomiting  [] Gastroesophageal reflux/heartburn   [] Difficulty swallowing. [] Abdominal pain Genitourinary:  [] Chronic kidney disease   [] Difficult urination  [] Anuric   [] Blood in urine [] Frequent urination  [] Burning with urination   [] Hematuria Skin:  [] Rashes    [] Ulcers [] Wounds Psychological:  [] History of anxiety   []  History of major depression  []  Memory Difficulties      OBJECTIVE:   Physical Exam  BP 113/69 (BP Location: Right Arm)   Pulse 74   Resp 16   Ht 5\' 8"  (1.727 m)   Wt 215 lb (97.5 kg)   BMI 32.69 kg/m   Gen: WD/WN, NAD Head: [place]/AT, No temporalis wasting.  Ear/Nose/Throat: Hearing grossly intact, nares w/o erythema or drainage Eyes: PER, EOMI, sclera nonicteric.  Neck: Supple, no masses.  No JVD.  Pulmonary:  Good air movement, no use of accessory muscles.  Cardiac: RRR Vascular:  1+ edema right ankle  Gastrointestinal: soft, non-distended. No guarding/no peritoneal signs.  Musculoskeletal: M/S 5/5 throughout.  No deformity or atrophy.  Neurologic: Pain and light touch intact in extremities.  Symmetrical.  Speech is fluent. Motor exam as listed above. Psychiatric: Judgment intact, Mood & affect appropriate for pt's clinical situation. Dermatologic: No Venous rashes. No Ulcers Noted.  No changes consistent with cellulitis. Lymph : No Cervical lymphadenopathy, no lichenification or skin changes of chronic lymphedema.       ASSESSMENT AND PLAN:  1. Atherosclerosis of artery of extremity with rest pain ([place]) Overall the patient seems to be doing well.  The right groin incision is covered with Dermabond.  There is a small area that appears to be peeling up some so Steri-Strips were placed to ensure no dehiscence would occur.  Patient instructed to leave Steri-Strips in place until they begin to flake up.  Patient instructed that he should not lift any heavy materials at this time.  He was advised not to lift anything heavier than a gallon of milk.  This is due to the fact that increased exertion could cause dehiscence of his wound site.  He is advised that he can do other everyday activities however including walking through [place] and even driving as long as he is not taking any oral pain medicine.  Patient is advised to  contact her office if he begins to have any fever, chills nausea or vomiting and the wound site begins to drain blood or any purulent drainage.  Patient is advised to utilize mild compression stockings 15 to 20 mmHg to help control swelling in addition to elevating his lower extremities as much as he can.  We will have the patient return in 6 weeks to evaluate the wound in addition we will obtain noninvasive studies to try comparison to the patient's noninvasive studies prior to intervention.   Current Outpatient Medications on File Prior to Visit  Medication Sig Dispense Refill  . acetaminophen (TYLENOL) 500 MG tablet Take 1,000 mg by mouth at bedtime.    . Ascorbic Acid (VITAMIN C) 1000 MG tablet Take 1,000 mg by mouth daily.    [name] aspirin 81 MG EC tablet Take 81 mg by mouth daily.      . Calcium-Magnesium-Vitamin D (CALCIUM 1200+D3 PO) Take 1 tablet by mouth daily.    . Cholecalciferol (VITAMIN D3) 50 MCG (2000 UT) TABS Take 2,000 Units by mouth daily.    . clopidogrel (PLAVIX) 75 MG tablet Take 1 tablet (75 mg total) by mouth daily. (Patient taking differently: Take 75 mg by mouth at bedtime. ) 30 tablet 5  . ibuprofen (ADVIL,MOTRIN) 200 MG tablet Take 400 mg by mouth every 6 (six) hours as needed for headache or moderate pain.     [name] lisinopril (ZESTRIL) 5 MG tablet Take 1 tablet (5 mg total) by mouth daily. (Patient taking differently: Take 5 mg by mouth every morning. ) 90 tablet 3  . metoprolol succinate (TOPROL-XL) 25 MG 24 hr tablet Take 0.5 tablets (  12.5 mg total) by mouth daily. (Patient taking differently: Take 12.5 mg by mouth at bedtime. ) 45 tablet 3  . nitroGLYCERIN (NITROSTAT) 0.4 MG SL tablet Place 1 tablet (0.4 mg total) under the tongue every 5 (five) minutes as needed for chest pain. 25 tablet 3  . NONFORMULARY OR COMPOUNDED ITEM Trimix (30/1/10)-(Pap/Phent/PGE)  Test Dose 3 82ml vials  Qty #3 Refills 0  [facility] [phone] Fax [phone] 3 each 0  .  oxyCODONE (OXY IR/ROXICODONE) 5 MG immediate release tablet Take 1 tablet (5 mg total) by mouth every 6 (six) hours as needed for up to 7 days for severe pain or breakthrough pain. 28 tablet 0  . pantoprazole (PROTONIX) 40 MG tablet Take 1 tablet (40 mg total) by mouth daily. (Patient taking differently: Take 40 mg by mouth every evening. ) 30 tablet 11  . Polyethyl Glycol-Propyl Glycol (LUBRICANT EYE DROPS) 0.4-0.3 % SOLN Place 1-2 drops into both eyes 3 (three) times daily as needed (burning eyes.).    [name] simvastatin (ZOCOR) 40 MG tablet Take 1 tablet (40 mg total) by mouth at bedtime. 30 tablet 2  . tamsulosin (FLOMAX) 0.4 MG CAPS capsule TAKE 1 CAPSULE BY MOUTH EVERY DAY AFTER SUPPER 90 capsule 3  . traMADol (ULTRAM) 50 MG tablet Take 1 tablet (50 mg total) by mouth every 6 (six) hours as needed for moderate pain. 28 tablet 0  . vitamin B-12 (CYANOCOBALAMIN) 500 MCG tablet Take 500 mcg by mouth daily.    . vitamin E 1000 UNIT capsule Take 1,000 Units by mouth daily. Vitamin E 450 mg    . Zinc 50 MG TABS Take 50 mg by mouth daily.     No current facility-administered medications on file prior to visit.    There are no Patient Instructions on file for this visit. No follow-ups on file.   Kris Hartmann, NP  This note was completed with Sales executive.  Any errors are purely unintentional.

## 2019-06-24 ENCOUNTER — Telehealth (INDEPENDENT_AMBULATORY_CARE_PROVIDER_SITE_OTHER): Payer: Self-pay

## 2019-06-24 NOTE — Telephone Encounter (Signed)
Patient called wanting to know if he could have the covid vaccine because he just recently had surgery on 06/15/19 with Dr. Delana Meyer. Per Eulogio Ditch NP the patient can have the vaccine. Patient was given the information.

## 2019-07-07 DIAGNOSIS — H43813 Vitreous degeneration, bilateral: Secondary | ICD-10-CM | POA: Diagnosis not present

## 2019-07-14 ENCOUNTER — Other Ambulatory Visit (INDEPENDENT_AMBULATORY_CARE_PROVIDER_SITE_OTHER): Payer: Self-pay | Admitting: Vascular Surgery

## 2019-07-14 ENCOUNTER — Other Ambulatory Visit: Payer: Self-pay | Admitting: Family Medicine

## 2019-07-14 NOTE — Telephone Encounter (Signed)
Name of Medication: Tramadol Name of Pharmacy: [facility] or Written Date and Quantity: 06/13/19, #30 Last Office Visit and Type: 05/02/19, 6 wk back pain & sleep f/u Next Office Visit and Type: 10/03/19, 5 mo f/u Last Controlled Substance Agreement Date: none Last UDS: 01/18/16, scanned

## 2019-07-17 NOTE — Telephone Encounter (Signed)
ERx 

## 2019-07-25 DIAGNOSIS — M5033 Other cervical disc degeneration, cervicothoracic region: Secondary | ICD-10-CM | POA: Diagnosis not present

## 2019-07-25 DIAGNOSIS — M9902 Segmental and somatic dysfunction of thoracic region: Secondary | ICD-10-CM | POA: Diagnosis not present

## 2019-07-25 DIAGNOSIS — M62830 Muscle spasm of back: Secondary | ICD-10-CM | POA: Diagnosis not present

## 2019-07-25 DIAGNOSIS — M9901 Segmental and somatic dysfunction of cervical region: Secondary | ICD-10-CM | POA: Diagnosis not present

## 2019-07-26 DIAGNOSIS — M9902 Segmental and somatic dysfunction of thoracic region: Secondary | ICD-10-CM | POA: Diagnosis not present

## 2019-07-26 DIAGNOSIS — M62830 Muscle spasm of back: Secondary | ICD-10-CM | POA: Diagnosis not present

## 2019-07-26 DIAGNOSIS — M5033 Other cervical disc degeneration, cervicothoracic region: Secondary | ICD-10-CM | POA: Diagnosis not present

## 2019-07-26 DIAGNOSIS — M9901 Segmental and somatic dysfunction of cervical region: Secondary | ICD-10-CM | POA: Diagnosis not present

## 2019-07-28 DIAGNOSIS — C44319 Basal cell carcinoma of skin of other parts of face: Secondary | ICD-10-CM | POA: Diagnosis not present

## 2019-07-29 DIAGNOSIS — M5033 Other cervical disc degeneration, cervicothoracic region: Secondary | ICD-10-CM | POA: Diagnosis not present

## 2019-07-29 DIAGNOSIS — M9902 Segmental and somatic dysfunction of thoracic region: Secondary | ICD-10-CM | POA: Diagnosis not present

## 2019-07-29 DIAGNOSIS — M62830 Muscle spasm of back: Secondary | ICD-10-CM | POA: Diagnosis not present

## 2019-07-29 DIAGNOSIS — M9901 Segmental and somatic dysfunction of cervical region: Secondary | ICD-10-CM | POA: Diagnosis not present

## 2019-08-04 DIAGNOSIS — C44519 Basal cell carcinoma of skin of other part of trunk: Secondary | ICD-10-CM | POA: Diagnosis not present

## 2019-08-04 DIAGNOSIS — C44319 Basal cell carcinoma of skin of other parts of face: Secondary | ICD-10-CM | POA: Diagnosis not present

## 2019-08-08 ENCOUNTER — Other Ambulatory Visit: Payer: Self-pay

## 2019-08-08 ENCOUNTER — Ambulatory Visit (INDEPENDENT_AMBULATORY_CARE_PROVIDER_SITE_OTHER): Payer: PPO

## 2019-08-08 ENCOUNTER — Other Ambulatory Visit (INDEPENDENT_AMBULATORY_CARE_PROVIDER_SITE_OTHER): Payer: Self-pay | Admitting: Vascular Surgery

## 2019-08-08 ENCOUNTER — Encounter (INDEPENDENT_AMBULATORY_CARE_PROVIDER_SITE_OTHER): Payer: Self-pay | Admitting: Vascular Surgery

## 2019-08-08 ENCOUNTER — Ambulatory Visit (INDEPENDENT_AMBULATORY_CARE_PROVIDER_SITE_OTHER): Payer: PPO | Admitting: Vascular Surgery

## 2019-08-08 VITALS — BP 136/76 | HR 67 | Resp 10 | Ht 68.0 in | Wt 210.0 lb

## 2019-08-08 DIAGNOSIS — I2510 Atherosclerotic heart disease of native coronary artery without angina pectoris: Secondary | ICD-10-CM

## 2019-08-08 DIAGNOSIS — Z9862 Peripheral vascular angioplasty status: Secondary | ICD-10-CM

## 2019-08-08 DIAGNOSIS — E782 Mixed hyperlipidemia: Secondary | ICD-10-CM

## 2019-08-08 DIAGNOSIS — I9789 Other postprocedural complications and disorders of the circulatory system, not elsewhere classified: Secondary | ICD-10-CM

## 2019-08-08 DIAGNOSIS — I10 Essential (primary) hypertension: Secondary | ICD-10-CM

## 2019-08-08 DIAGNOSIS — I4891 Unspecified atrial fibrillation: Secondary | ICD-10-CM

## 2019-08-08 DIAGNOSIS — I70213 Atherosclerosis of native arteries of extremities with intermittent claudication, bilateral legs: Secondary | ICD-10-CM

## 2019-08-10 NOTE — Progress Notes (Signed)
Patient ID: Randall Decaire., male   DOB: 1940-06-13, 79 y.o.   MRN: [id number]  Chief Complaint  Patient presents with  . Follow-up    U/s Follow Up    HPI Randall Voris. is a 79 y.o. male.    Patient states his rest pain symptoms are completely gone.  He is walking without difficulty.  He notes that he has to walk "pretty fast to get any cramping"  No groin related complaints   Past Medical History:  Diagnosis Date  . Allergy    seasonal  . Arthritis    all over- in general   . CAD (coronary artery disease)    a. inferior wall MI 10/01 s/p PCI/DES to RCA; b. Myoview 3/16 neg for ischemia; c. LHC 8/16: ostLAD 80%, OM1 70%, OM2 70% x 2 lesions, mRCA 30%, dRCA 70% s/p 4-V CABG 01/24/15 (LIMA-LAD, VG- OM1, VG-OM2, VG-PDA)   . Cancer (HCC)    skin, melanoma  . Carotid artery disease ([place])    a. [place] 8/16: 1-39% bilateral ICA stenosis  . Cataract    removed  . Diastolic dysfunction    a. TTE 8/16: EF 55-60%, no RWMA, Gr1DD, calcified mitral annulus, mild biatrial enlargement  . Erectile dysfunction   . GERD (gastroesophageal reflux disease)   . History of elbow surgery   . History of hiatal hernia   . HLD (hyperlipidemia)   . HTN (hypertension)   . Inferior myocardial infarction ([place]) 10/01   stent RCA  . Postoperative wound infection 02/02/2015  . Reflux esophagitis   . Sleep apnea 2017   CPAP at night    Past Surgical History:  Procedure Laterality Date  . arm surgery  2010  . CARDIAC CATHETERIZATION  06/24/11  . CARDIAC CATHETERIZATION N/A 01/18/2015   Procedure: Left Heart Cath with coronary angiography;  Surgeon: Minna Merritts, MD;  Location: [facility];  Service: Cardiovascular;  Laterality: N/A;  . CARDIAC CATHETERIZATION N/A 01/18/2015   Procedure: Intravascular Pressure Wire/FFR Study;  Surgeon: [name], MD;  Location: [facility];  Service: Cardiovascular;  Laterality: N/A;  . CAROTID STENT  03/10/2011  . COLONOSCOPY   2010  . COLONOSCOPY  06/14/2014   Dr Hilarie Fredrickson  . CORONARY ARTERY BYPASS GRAFT N/A 01/24/2015   Procedure: CORONARY ARTERY BYPASS GRAFTING x 4 (LIMA-LAD, SVG-Int 1- Int 2, SVG-PD) ENDOSCOPIC GREATER SAPHENOUS VEIN HARVEST LEFT LEG;  Surgeon: [name], MD;  Location: [facility];  Service: Open Heart Surgery;  Laterality: N/A;  . EMBOLECTOMY  06/15/2019   Procedure: EMBOLECTOMY;  Surgeon: Katha Cabal, MD;  Location: [facility];  Service: Vascular;;  right superficial femoral artery  . ENDARTERECTOMY FEMORAL Right 06/15/2019   Procedure: ENDARTERECTOMY FEMORAL;  Surgeon: Katha Cabal, MD;  Location: [facility];  Service: Vascular;  Laterality: Right;  common femoral profunda femoris superficial femoral  . ESOPHAGOGASTRODUODENOSCOPY (EGD) WITH PROPOFOL N/A 04/24/2016   Procedure: ESOPHAGOGASTRODUODENOSCOPY (EGD) WITH PROPOFOL;  Surgeon: Jerene Bears, MD;  [facility];  Service: Gastroenterology;  Laterality: N/A;  . EYE SURGERY     lasik 15 yrs. ago, cataracts removed - both eyes   . HAMMER TOE SURGERY     right toe  . INSERTION OF ILIAC STENT Right 06/15/2019   Procedure: INSERTION OF ILIAC STENT ( STENTING OF SFA/POP ARTERY );  Surgeon: Katha Cabal, MD;  Location: [facility];  Service: Vascular;  Laterality: Right;  angioplpasty and stent placement:  right superficial femoral right tibiopopliteal trunk bilateral common iliac arteries  . LEFT HEART CATH AND CORONARY ANGIOGRAPHY Left 06/10/2017   Procedure: LEFT HEART CATH AND CORONARY ANGIOGRAPHY;  Surgeon: Minna Merritts, MD;  Location: [facility];  Service: Cardiovascular;  Laterality: Left;  . LOWER EXTREMITY ANGIOGRAPHY Left 01/04/2019   Procedure: LOWER EXTREMITY ANGIOGRAPHY;  Surgeon: Katha Cabal, MD;  Location: [facility];  Service: Cardiovascular;  Laterality: Left;  . LOWER EXTREMITY ANGIOGRAPHY Right 01/25/2019   Procedure: LOWER EXTREMITY ANGIOGRAPHY;  Surgeon: Katha Cabal, MD;   Location: [facility];  Service: Cardiovascular;  Laterality: Right;  . NASAL SINUS SURGERY  2008   septpolasty, bilateral turbinate reduction  . SHOULDER ARTHROSCOPY  2012  . TEE WITHOUT CARDIOVERSION N/A 01/24/2015   Procedure: TRANSESOPHAGEAL ECHOCARDIOGRAM (TEE);  Surgeon: [name], MD;  Location: [facility];  Service: Open Heart Surgery;  Laterality: N/A;  . [place]  . UPPER GI ENDOSCOPY  07/2014, 04-24-16   Dr Raquel James  . WRIST SURGERY  2011      No Known Allergies  Current Outpatient Medications  Medication Sig Dispense Refill  . acetaminophen (TYLENOL) 500 MG tablet Take 1,000 mg by mouth at bedtime.    . Ascorbic Acid (VITAMIN C) 1000 MG tablet Take 1,000 mg by mouth daily.    [name] aspirin 81 MG EC tablet Take 81 mg by mouth daily.      . Calcium-Magnesium-Vitamin D (CALCIUM 1200+D3 PO) Take 1 tablet by mouth daily.    . Cholecalciferol (VITAMIN D3) 50 MCG (2000 UT) TABS Take 2,000 Units by mouth daily.    . clopidogrel (PLAVIX) 75 MG tablet TAKE 1 TABLET BY MOUTH EVERY DAY 90 tablet 3  . ibuprofen (ADVIL,MOTRIN) 200 MG tablet Take 400 mg by mouth every 6 (six) hours as needed for headache or moderate pain.     [name] lisinopril (ZESTRIL) 5 MG tablet Take 1 tablet (5 mg total) by mouth daily. (Patient taking differently: Take 5 mg by mouth every morning. ) 90 tablet 3  . metoprolol succinate (TOPROL-XL) 25 MG 24 hr tablet Take 0.5 tablets (12.5 mg total) by mouth daily. (Patient taking differently: Take 12.5 mg by mouth at bedtime. ) 45 tablet 3  . nitroGLYCERIN (NITROSTAT) 0.4 MG SL tablet Place 1 tablet (0.4 mg total) under the tongue every 5 (five) minutes as needed for chest pain. 25 tablet 3  . pantoprazole (PROTONIX) 40 MG tablet Take 1 tablet (40 mg total) by mouth daily. (Patient taking differently: Take 40 mg by mouth every evening. ) 30 tablet 11  . Polyethyl Glycol-Propyl Glycol (LUBRICANT EYE DROPS) 0.4-0.3 % SOLN Place 1-2 drops into both eyes 3 (three)  times daily as needed (burning eyes.).    [name] simvastatin (ZOCOR) 40 MG tablet Take 1 tablet (40 mg total) by mouth at bedtime. 30 tablet 2  . tamsulosin (FLOMAX) 0.4 MG CAPS capsule TAKE 1 CAPSULE BY MOUTH EVERY DAY AFTER SUPPER 90 capsule 3  . traMADol (ULTRAM) 50 MG tablet TAKE 1 TO 2 TABLETS(50 TO 100 MG) BY MOUTH TWICE DAILY AS NEEDED 30 tablet 0  . vitamin B-12 (CYANOCOBALAMIN) 500 MCG tablet Take 500 mcg by mouth daily.    . vitamin E 1000 UNIT capsule Take 1,000 Units by mouth daily. Vitamin E 450 mg    . Zinc 50 MG TABS Take 50 mg by mouth daily.    . NONFORMULARY OR COMPOUNDED ITEM Trimix (30/1/10)-(Pap/Phent/PGE)  Test  Dose 3 64ml vials  Qty #3 [place] [phone] Fax [phone] (Patient not taking: Reported on 08/08/2019) 3 each 0   No current facility-administered medications for this visit.        Physical Exam BP 136/76   Pulse 67   Resp 10   Ht 5\' 8"  (1.727 m)   Wt 210 lb (95.3 kg)   BMI 31.93 kg/m  Gen:  WD/WN, NAD Skin: incision C/D/I; 2+ PT pulses bilaterally     Assessment/Plan:  1. Atherosclerosis of native artery of both lower extremities with intermittent claudication (HCC) Recommend:  The patient is status post successful combined angiogram with intervention and open endarterectomy.  The patient reports that the claudication symptoms and leg pain is essentially gone.   The patient denies lifestyle limiting changes at this point in time.  No further invasive studies, angiography or surgery at this time The patient should continue walking and begin a more formal exercise program.  The patient should continue antiplatelet therapy and aggressive treatment of the lipid abnormalities  The patient should continue wearing graduated compression socks 10-15 mmHg strength to control the mild edema.  Patient should undergo noninvasive studies as ordered. The patient will follow up with me after the studies.   - VAS [place] ABI WITH/WO  TBI; Future  2. Atherosclerosis of native coronary artery of native heart without angina pectoris Continue cardiac and antihypertensive medications as already ordered and reviewed, no changes at this time.  Continue statin as ordered and reviewed, no changes at this time  Nitrates PRN for chest pain   3. Essential hypertension Continue antihypertensive medications as already ordered, these medications have been reviewed and there are no changes at this time.   4. Postoperative atrial fibrillation (HCC) Continue antiarrhythmia medications as already ordered, these medications have been reviewed and there are no changes at this time.  Continue anticoagulation as ordered by Cardiology Service   5. Mixed hyperlipidemia Continue statin as ordered and reviewed, no changes at this time    Hortencia Pilar 08/10/2019, 5:20 PM   This note was created with Dragon medical transcription system.  Any errors from dictation are unintentional.

## 2019-08-15 DIAGNOSIS — H02831 Dermatochalasis of right upper eyelid: Secondary | ICD-10-CM | POA: Diagnosis not present

## 2019-08-15 DIAGNOSIS — H02834 Dermatochalasis of left upper eyelid: Secondary | ICD-10-CM | POA: Diagnosis not present

## 2019-08-15 NOTE — Progress Notes (Signed)
Cardiology Office Note  Date:  08/16/2019   ID:  Randall Lofts., DOB 1940/08/21, MRN [id number]  PCP:  Randall Bush, Ali   Chief Complaint  Patient presents with  . office visit    Pt has no complaints just wanting to talk about Plavix medication. Meds verbally reviewed w/ pt.    HPI:  Randall Ali is a very pleasant 79 year old gentleman with a history of  coronary artery disease, inferior wall MI 2001 with stent to the RCA,    hypertension,  hyperlipidemia.  Previous admit in 2011 for mild chest pain and sweating.  Stress test showed no ischemia with inferior scar.  Previous cardiac catheterization showing moderate OM disease.  Repeat catheterization  in August 2016 showing severe three-vessel disease,  sent for CABG on 01/24/2015.  He presents today for follow-up of his coronary artery disease   LE PCI followed by  common femoral endarterectomy extending into the profunda femoris and the SFA. --Angioplasty to the right tibioperoneal trunk with 5 mm diameter Lutonix drug-coated angioplasty balloon 4. Stent placement to the right tibioperoneal artery with 6 mm diameter by 4 cm length life stent 5.Stent placement to the right SFA and popliteal artery x2 with 7 mm diameter by 20 cm length and 7 mm diameter by 15 cm length life stent postdilated with 7 mm diameter withLutonixdrug-coated angioplasty balloons 6.Fogarty embolectomy balloon to the right SFA and popliteal arteries for thrombus after above procedures  On plavix, worried about bruising  Works at [employer] part time  Chronic back pain,  Seen by neurosurgery  Denies having significant shortness of breath or chest pain Has NTG, not using it  EKG personally reviewed by myself on todays visit Shows normal sinus rhythm with rate 71 bpm old inferior MI, no change from prior EKG  Other past medical history reviewed Cardiac catheterization 06/10/2017 Severe three vessel disease, occluded SVG to the  RCA, Patent SVG to OM 1 and OM2, patent LIMA to the LAD Will try medical management first Add plavix, nitrates  intervention would require atherectomy in [facility]  GERD sx improved on PPI EGD  07/2014 showing moderate to severe esophagitis  postoperative atrial fibrillation,  postoperative delirium and began having fevers on 01/29/2015.  some drainage from the inferior portion of his sternal wound. Blood cultures and wound cultures grew Enterobacter aerogenes. He was discharged on IV ceftriaxone  and completed 6 weeks of therapy . He took probiotics during this time  PMH:   has a past medical history of Allergy, Arthritis, CAD (coronary artery disease), Cancer ([place]), Carotid artery disease (Randall Ali), Cataract, Diastolic dysfunction, Erectile dysfunction, GERD (gastroesophageal reflux disease), History of elbow surgery, History of hiatal hernia, HLD (hyperlipidemia), HTN (hypertension), Inferior myocardial infarction ([place]) (10/01), Postoperative wound infection (02/02/2015), Reflux esophagitis, and Sleep apnea (2017).  PSH:    Past Surgical History:  Procedure Laterality Date  . arm surgery  2010  . CARDIAC CATHETERIZATION  06/24/11  . CARDIAC CATHETERIZATION N/A 01/18/2015   Procedure: Left Heart Cath with coronary angiography;  Surgeon: Randall Merritts, Ali;  Location: [facility];  Service: Cardiovascular;  Laterality: N/A;  . CARDIAC CATHETERIZATION N/A 01/18/2015   Procedure: Intravascular Pressure Wire/FFR Study;  Surgeon: Randall Hampshire, Ali;  Location: [place] [facility];  Service: Cardiovascular;  Laterality: N/A;  . CAROTID STENT  03/10/2011  . COLONOSCOPY  2010  . COLONOSCOPY  06/14/2014   Randall Ali  . CORONARY ARTERY BYPASS GRAFT N/A 01/24/2015   Procedure: CORONARY  ARTERY BYPASS GRAFTING x 4 (LIMA-LAD, SVG-Int 1- Int 2, SVG-PD) ENDOSCOPIC GREATER SAPHENOUS VEIN HARVEST LEFT LEG;  Surgeon: Randall Isaac, Ali;  Location: [facility];  Service: Open Heart Surgery;  Laterality:  N/A;  . EMBOLECTOMY  06/15/2019   Procedure: EMBOLECTOMY;  Surgeon: Randall Cabal, Ali;  Location: [facility];  Service: Vascular;;  right superficial femoral artery  . ENDARTERECTOMY FEMORAL Right 06/15/2019   Procedure: ENDARTERECTOMY FEMORAL;  Surgeon: Randall Cabal, Ali;  Location: [facility];  Service: Vascular;  Laterality: Right;  common femoral profunda femoris superficial femoral  . ESOPHAGOGASTRODUODENOSCOPY (EGD) WITH PROPOFOL N/A 04/24/2016   Procedure: ESOPHAGOGASTRODUODENOSCOPY (EGD) WITH PROPOFOL;  Surgeon: Randall Ali;  [facility];  Service: Gastroenterology;  Laterality: N/A;  . EYE SURGERY     lasik 15 yrs. ago, cataracts removed - both eyes   . HAMMER TOE SURGERY     right toe  . INSERTION OF ILIAC STENT Right 06/15/2019   Procedure: INSERTION OF ILIAC STENT ( STENTING OF SFA/POP ARTERY );  Surgeon: Randall Cabal, Ali;  Location: [facility];  Service: Vascular;  Laterality: Right;  angioplpasty and stent placement: right superficial femoral right tibiopopliteal trunk bilateral common iliac arteries  . LEFT HEART CATH AND CORONARY ANGIOGRAPHY Left 06/10/2017   Procedure: LEFT HEART CATH AND CORONARY ANGIOGRAPHY;  Surgeon: Randall Merritts, Ali;  Location: [facility] [facility];  Service: Cardiovascular;  Laterality: Left;  . LOWER EXTREMITY ANGIOGRAPHY Left 01/04/2019   Procedure: LOWER EXTREMITY ANGIOGRAPHY;  Surgeon: Randall Cabal, Ali;  Location: [facility];  Service: Cardiovascular;  Laterality: Left;  . LOWER EXTREMITY ANGIOGRAPHY Right 01/25/2019   Procedure: LOWER EXTREMITY ANGIOGRAPHY;  Surgeon: Randall Cabal, Ali;  Location: [facility];  Service: Cardiovascular;  Laterality: Right;  . NASAL SINUS SURGERY  2008   septpolasty, bilateral turbinate reduction  . SHOULDER ARTHROSCOPY  2012  . TEE WITHOUT CARDIOVERSION N/A 01/24/2015   Procedure: TRANSESOPHAGEAL ECHOCARDIOGRAM (TEE);  Surgeon: Randall Isaac, Ali;  Location: [facility];  Service: Open Heart Surgery;  Laterality: N/A;  . [place]  . UPPER GI ENDOSCOPY  07/2014, 04-24-16   Randall Ali  . WRIST SURGERY  2011    Current Outpatient Medications  Medication Sig Dispense Refill  . acetaminophen (TYLENOL) 500 MG tablet Take 1,000 mg by mouth at bedtime.    . Ascorbic Acid (VITAMIN C) 1000 MG tablet Take 1,000 mg by mouth daily.    [name] aspirin 81 MG EC tablet Take 81 mg by mouth daily.      . Calcium-Magnesium-Vitamin D (CALCIUM 1200+D3 PO) Take 1 tablet by mouth daily.    . Cholecalciferol (VITAMIN D3) 50 MCG (2000 UT) TABS Take 2,000 Units by mouth daily.    . clopidogrel (PLAVIX) 75 MG tablet TAKE 1 TABLET BY MOUTH EVERY DAY 90 tablet 3  . ibuprofen (ADVIL,MOTRIN) 200 MG tablet Take 400 mg by mouth every 6 (six) hours as needed for headache or moderate pain.     [name] lisinopril (ZESTRIL) 5 MG tablet Take 1 tablet (5 mg total) by mouth daily. (Patient taking differently: Take 5 mg by mouth every morning. ) 90 tablet 3  . metoprolol succinate (TOPROL-XL) 25 MG 24 hr tablet Take 0.5 tablets (12.5 mg total) by mouth daily. (Patient taking differently: Take 12.5 mg by mouth at bedtime. ) 45 tablet 3  . nitroGLYCERIN (NITROSTAT) 0.4 MG SL tablet Place 1 tablet (0.4 mg total) under the  tongue every 5 (five) minutes as needed for chest pain. 25 tablet 3  . NONFORMULARY OR COMPOUNDED ITEM Trimix (30/1/10)-(Pap/Phent/PGE)  Test Dose 3 74ml vials  Qty #3 Refills 0  [place] [phone] Fax [phone] 3 each 0  . pantoprazole (PROTONIX) 40 MG tablet Take 1 tablet (40 mg total) by mouth daily. (Patient taking differently: Take 40 mg by mouth every evening. ) 30 tablet 11  . Polyethyl Glycol-Propyl Glycol (LUBRICANT EYE DROPS) 0.4-0.3 % SOLN Place 1-2 drops into both eyes 3 (three) times daily as needed (burning eyes.).    [name] simvastatin (ZOCOR) 40 MG tablet Take 1 tablet (40 mg total) by mouth at bedtime. 30 tablet 2  . tamsulosin (FLOMAX) 0.4 MG CAPS  capsule TAKE 1 CAPSULE BY MOUTH EVERY DAY AFTER SUPPER 90 capsule 3  . traMADol (ULTRAM) 50 MG tablet TAKE 1 TO 2 TABLETS(50 TO 100 MG) BY MOUTH TWICE DAILY AS NEEDED 30 tablet 0  . vitamin B-12 (CYANOCOBALAMIN) 500 MCG tablet Take 500 mcg by mouth daily.    . vitamin E 1000 UNIT capsule Take 1,000 Units by mouth daily. Vitamin E 450 mg    . Zinc 50 MG TABS Take 50 mg by mouth daily.     No current facility-administered medications for this visit.    Allergies:   Patient has no known allergies.   Social History:  The patient  reports that he quit smoking about 19 years ago. His smoking use included cigarettes. He has a 100.00 pack-year smoking history. He has never used smokeless tobacco. He reports current alcohol use of about 5.0 standard drinks of alcohol per week. He reports that he does not use drugs.   Family History:   family history includes Cancer in his paternal grandfather; Diabetes in his father; Heart disease in his mother; Heart disease (age of onset: 81) in his brother; Hypertension in his father and mother.   Review of Systems: Review of Systems  Constitutional: Negative.   Respiratory: Negative.   Cardiovascular: Negative.   Gastrointestinal: Negative.   Musculoskeletal: Negative.        Leg weakness  Neurological: Negative.   Psychiatric/Behavioral: Negative.   All other systems reviewed and are negative.   PHYSICAL EXAM: VS:  BP 124/62 (BP Location: Left Arm, Patient Position: Sitting, Cuff Size: Normal)   Pulse 71   Ht 5\' 8"  (1.727 m)   Wt 210 lb 8 oz (95.5 kg)   SpO2 94%   BMI 32.01 kg/m  , BMI Body mass index is 32.01 kg/m. Constitutional:  oriented to person, place, and time. No distress.  HENT:  Head: Grossly normal Eyes:  no discharge. No scleral icterus.  Neck: No JVD, no carotid bruits  Cardiovascular: Regular rate and rhythm, no murmurs appreciated Pulmonary/Chest: Clear to auscultation bilaterally, no wheezes or rails Abdominal: Soft.  no  distension.  no tenderness.  Musculoskeletal: Normal range of motion Neurological:  normal muscle tone. Coordination normal. No atrophy Skin: Skin warm and dry Psychiatric: normal affect, pleasant   Recent Labs: 10/27/2018: TSH 1.565 01/06/2019: ALT 14 06/16/2019: BUN 20; Creatinine, Ser 1.07; Hemoglobin 13.9; Magnesium 2.1; Platelets 202; Potassium 4.0; Sodium 138    Lipid Panel Lab Results  Component Value Date   CHOL 115 01/06/2019   HDL 43.30 01/06/2019   LDLCALC 56 01/06/2019   TRIG 76.0 01/06/2019    Wt Readings from Last 3 Encounters:  08/16/19 210 lb 8 oz (95.5 kg)  08/08/19 210 lb (95.3 kg)  06/23/19 215  lb (97.5 kg)     ASSESSMENT AND PLAN:   Atherosclerosis of native coronary artery of native heart without angina pectoris -  Currently with no symptoms of angina. No further workup at this time. Continue current medication regimen.  Paroxysmal atrial fibrillation (HCC) - Plan: EKG 12-Lead Prior history of postop A. fib after bypass surgery Does not want anticoagulation, " I bleed enough as it is" Maintaining NSr  PAD (peripheral artery disease) ([place]) Major procedure by vascular 06/2019 On plavix, asa 81 LDL <70  Pure hypercholesterolemia zetia offered Prefers to stay on simvastatin  Essential hypertension Blood pressure is well controlled on today's visit. No changes made to the medications.  S/P CABG x 4 Currently with no symptoms of angina. No further workup at this time. Continue current medication regimen.  GERD moderate to severe esophagitis on EGD  On ppi, stable  erectile dysfunction wanst hormones, Recommended talk with PMD   Total encounter time more than 25 minutes  Greater than 50% was spent in counseling and coordination of care with the patient  Disposition:   F/U  12 months   No orders of the defined types were placed in this encounter.    Signed, Esmond Plants, M.D., Ph.D. 08/16/2019  [place],  [place]

## 2019-08-16 ENCOUNTER — Encounter: Payer: Self-pay | Admitting: Cardiovascular Disease

## 2019-08-16 ENCOUNTER — Ambulatory Visit (INDEPENDENT_AMBULATORY_CARE_PROVIDER_SITE_OTHER): Payer: PPO | Admitting: Cardiovascular Disease

## 2019-08-16 ENCOUNTER — Other Ambulatory Visit: Payer: Self-pay

## 2019-08-16 VITALS — BP 124/62 | HR 71 | Ht 68.0 in | Wt 210.5 lb

## 2019-08-16 DIAGNOSIS — I771 Stricture of artery: Secondary | ICD-10-CM

## 2019-08-16 DIAGNOSIS — Z951 Presence of aortocoronary bypass graft: Secondary | ICD-10-CM

## 2019-08-16 DIAGNOSIS — I25110 Atherosclerotic heart disease of native coronary artery with unstable angina pectoris: Secondary | ICD-10-CM | POA: Diagnosis not present

## 2019-08-16 DIAGNOSIS — I10 Essential (primary) hypertension: Secondary | ICD-10-CM

## 2019-08-16 DIAGNOSIS — I739 Peripheral vascular disease, unspecified: Secondary | ICD-10-CM | POA: Diagnosis not present

## 2019-08-16 DIAGNOSIS — I480 Paroxysmal atrial fibrillation: Secondary | ICD-10-CM

## 2019-08-16 NOTE — Patient Instructions (Signed)

## 2019-08-17 ENCOUNTER — Other Ambulatory Visit: Payer: Self-pay | Admitting: Family Medicine

## 2019-08-17 NOTE — Telephone Encounter (Signed)
Name of Medication: Tramadol Name of Pharmacy: [facility] [place] or Written Date and Quantity: 07/17/19 #30 Last Office Visit and Type: 05/02/19 Next Office Visit and Type: 10/03/19 Last Controlled Substance Agreement Date: none Last UDS:01/18/16

## 2019-08-18 NOTE — Telephone Encounter (Signed)
ERx 

## 2019-08-22 ENCOUNTER — Telehealth (INDEPENDENT_AMBULATORY_CARE_PROVIDER_SITE_OTHER): Payer: Self-pay

## 2019-08-22 NOTE — Telephone Encounter (Signed)
Patient left a voicemail asking questions about how long he would be blood thinners and stated that he has been easy to bruise. I spoke with Dr Delana Meyer and he advise that he discuss about the blood thinner with the patient next follow up. The patient has been made aware with medical advice.

## 2019-09-02 DIAGNOSIS — M9902 Segmental and somatic dysfunction of thoracic region: Secondary | ICD-10-CM | POA: Diagnosis not present

## 2019-09-02 DIAGNOSIS — M5033 Other cervical disc degeneration, cervicothoracic region: Secondary | ICD-10-CM | POA: Diagnosis not present

## 2019-09-02 DIAGNOSIS — M9901 Segmental and somatic dysfunction of cervical region: Secondary | ICD-10-CM | POA: Diagnosis not present

## 2019-09-02 DIAGNOSIS — M62830 Muscle spasm of back: Secondary | ICD-10-CM | POA: Diagnosis not present

## 2019-09-05 ENCOUNTER — Other Ambulatory Visit: Payer: Self-pay | Admitting: *Deleted

## 2019-09-05 MED ORDER — SIMVASTATIN 40 MG PO TABS: 40.0000 mg | ORAL_TABLET | Freq: Every day | ORAL | 11 refills | Status: DC

## 2019-09-05 NOTE — Telephone Encounter (Signed)
Requested Prescriptions   Signed Prescriptions Disp Refills  . simvastatin (ZOCOR) 40 MG tablet 30 tablet 11    Sig: Take 1 tablet (40 mg total) by mouth at bedtime.    Authorizing Provider: Minna Merritts    Ordering User: Britt Bottom

## 2019-09-07 DIAGNOSIS — M9902 Segmental and somatic dysfunction of thoracic region: Secondary | ICD-10-CM | POA: Diagnosis not present

## 2019-09-07 DIAGNOSIS — M62830 Muscle spasm of back: Secondary | ICD-10-CM | POA: Diagnosis not present

## 2019-09-07 DIAGNOSIS — M5033 Other cervical disc degeneration, cervicothoracic region: Secondary | ICD-10-CM | POA: Diagnosis not present

## 2019-09-07 DIAGNOSIS — M9901 Segmental and somatic dysfunction of cervical region: Secondary | ICD-10-CM | POA: Diagnosis not present

## 2019-09-13 ENCOUNTER — Other Ambulatory Visit: Payer: Self-pay | Admitting: Family Medicine

## 2019-09-13 NOTE — Telephone Encounter (Signed)
ERx 

## 2019-09-13 NOTE — Telephone Encounter (Signed)
Name of Medication: Tramadol Name of Pharmacy: [place] or Written Date and Quantity: 08/18/19, #30 Last Office Visit and Type: 05/02/19, 6 wk f/u; 03/17/19 [facility] Next Office Visit and Type: 10/03/19, 5 mo f/u Last Controlled Substance Agreement Date: none Last UDS: 01/18/16, scanned

## 2019-09-14 DIAGNOSIS — M9901 Segmental and somatic dysfunction of cervical region: Secondary | ICD-10-CM | POA: Diagnosis not present

## 2019-09-14 DIAGNOSIS — M62830 Muscle spasm of back: Secondary | ICD-10-CM | POA: Diagnosis not present

## 2019-09-14 DIAGNOSIS — M5033 Other cervical disc degeneration, cervicothoracic region: Secondary | ICD-10-CM | POA: Diagnosis not present

## 2019-09-14 DIAGNOSIS — M9902 Segmental and somatic dysfunction of thoracic region: Secondary | ICD-10-CM | POA: Diagnosis not present

## 2019-09-15 DIAGNOSIS — L574 Cutis laxa senilis: Secondary | ICD-10-CM | POA: Diagnosis not present

## 2019-09-16 DIAGNOSIS — M19022 Primary osteoarthritis, left elbow: Secondary | ICD-10-CM | POA: Diagnosis not present

## 2019-09-21 DIAGNOSIS — M7712 Lateral epicondylitis, left elbow: Secondary | ICD-10-CM | POA: Diagnosis not present

## 2019-09-29 DIAGNOSIS — M5033 Other cervical disc degeneration, cervicothoracic region: Secondary | ICD-10-CM | POA: Diagnosis not present

## 2019-09-29 DIAGNOSIS — M9901 Segmental and somatic dysfunction of cervical region: Secondary | ICD-10-CM | POA: Diagnosis not present

## 2019-09-29 DIAGNOSIS — M62830 Muscle spasm of back: Secondary | ICD-10-CM | POA: Diagnosis not present

## 2019-09-29 DIAGNOSIS — M9902 Segmental and somatic dysfunction of thoracic region: Secondary | ICD-10-CM | POA: Diagnosis not present

## 2019-09-30 DIAGNOSIS — M5033 Other cervical disc degeneration, cervicothoracic region: Secondary | ICD-10-CM | POA: Diagnosis not present

## 2019-09-30 DIAGNOSIS — M9902 Segmental and somatic dysfunction of thoracic region: Secondary | ICD-10-CM | POA: Diagnosis not present

## 2019-09-30 DIAGNOSIS — M9901 Segmental and somatic dysfunction of cervical region: Secondary | ICD-10-CM | POA: Diagnosis not present

## 2019-09-30 DIAGNOSIS — M62830 Muscle spasm of back: Secondary | ICD-10-CM | POA: Diagnosis not present

## 2019-10-03 ENCOUNTER — Other Ambulatory Visit: Payer: Self-pay

## 2019-10-03 ENCOUNTER — Encounter: Payer: Self-pay | Admitting: Family Medicine

## 2019-10-03 ENCOUNTER — Ambulatory Visit (INDEPENDENT_AMBULATORY_CARE_PROVIDER_SITE_OTHER): Payer: PPO | Admitting: Family Medicine

## 2019-10-03 VITALS — BP 136/74 | HR 65 | Temp 97.9°F | Ht 68.0 in | Wt 207.3 lb

## 2019-10-03 DIAGNOSIS — Z8719 Personal history of other diseases of the digestive system: Secondary | ICD-10-CM

## 2019-10-03 DIAGNOSIS — M5442 Lumbago with sciatica, left side: Secondary | ICD-10-CM | POA: Diagnosis not present

## 2019-10-03 DIAGNOSIS — M47816 Spondylosis without myelopathy or radiculopathy, lumbar region: Secondary | ICD-10-CM

## 2019-10-03 DIAGNOSIS — R5382 Chronic fatigue, unspecified: Secondary | ICD-10-CM

## 2019-10-03 DIAGNOSIS — I4891 Unspecified atrial fibrillation: Secondary | ICD-10-CM | POA: Diagnosis not present

## 2019-10-03 DIAGNOSIS — I10 Essential (primary) hypertension: Secondary | ICD-10-CM

## 2019-10-03 DIAGNOSIS — E782 Mixed hyperlipidemia: Secondary | ICD-10-CM

## 2019-10-03 DIAGNOSIS — I70213 Atherosclerosis of native arteries of extremities with intermittent claudication, bilateral legs: Secondary | ICD-10-CM

## 2019-10-03 DIAGNOSIS — I9789 Other postprocedural complications and disorders of the circulatory system, not elsewhere classified: Secondary | ICD-10-CM | POA: Diagnosis not present

## 2019-10-03 DIAGNOSIS — F5101 Primary insomnia: Secondary | ICD-10-CM

## 2019-10-03 DIAGNOSIS — G8929 Other chronic pain: Secondary | ICD-10-CM

## 2019-10-03 DIAGNOSIS — R5381 Other malaise: Secondary | ICD-10-CM | POA: Diagnosis not present

## 2019-10-03 NOTE — Assessment & Plan Note (Signed)
This has significantly improved off meds over the last several months, unclear why.

## 2019-10-03 NOTE — Assessment & Plan Note (Signed)
FLP stable on simvastatin - does not desire further medication.  The ASCVD Risk score Mikey Bussing DC Jr., et al., 2013) failed to calculate for the following reasons:   The valid total cholesterol range is 130 to 320 mg/dL

## 2019-10-03 NOTE — Assessment & Plan Note (Signed)
This has improved since PAD procedures. He also notes he's sleeping better.

## 2019-10-03 NOTE — Assessment & Plan Note (Signed)
Sounds regular today. Does not want anticoagulation.

## 2019-10-03 NOTE — Assessment & Plan Note (Signed)
Chronic, stable. Continue low dose metoprolol and lisinopril.

## 2019-10-03 NOTE — Assessment & Plan Note (Signed)
Appreciate VVS care.  Overall doing very well post procedure.

## 2019-10-03 NOTE — Patient Instructions (Signed)
You are doing well today Continue current medicines.  Return as needed or in 6 months for physical.

## 2019-10-03 NOTE — Progress Notes (Signed)
This visit was conducted in person.  BP 136/74 (BP Location: Left Arm, Patient Position: Sitting, Cuff Size: Normal)   Pulse 65   Temp 97.9 F (36.6 C) (Temporal)   Ht 5\' 8"  (1.727 m)   Wt 207 lb 5 oz (94 kg)   SpO2 94%   BMI 31.52 kg/m    CC: 5 mo f/u visit  Subjective:    Patient ID: Randall Ali., male    DOB: [birth year], 79 y.o.   MRN: [id number]  HPI: Randall Ali. is a 79 y.o. male presenting on 10/03/2019 for Follow-up (Here for 5 mo f/u. )   Since last seen, has seen cards and [facility]VS. Known afib - has declined anticoagulation. 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 PAD s/p R fem open endarterectomy with stenting of SFA/pop artery 06/2019 - on plavix, aspirin. Legs feel great! Played 4 softball games over the weekend.   H/o mod-severe esophagitis on EGD managed with daily PPI.   Chronic sleep maintenance insomnia - actually sleeping better over the last several months. Finds this has helped energy levels as well.  Chronic lower back pain - takes tramadol every morning. Has been advised back surgery - but wants to avoid.   Has been told has decreased bone strength - and has evidence of bone demineralization on xrays. He takes calcium and vitamin D supplements daily (Dr Truman Hayward prior PCP).   Completed covid vaccines.   PAD procedure 06/2019: 1. Rightcommon femoral, superficial femoral and profunda femoris endarterectomy with Cormatrix patch angioplasty 2. Open angioplasty and stent placement right tibioperoneal trunk with a 6 mm x 4 cm life stent postdilated to 5 mm with Lutonix drug-eluting balloon. 3. Open angioplasty and stent placement right superficial femoral and popliteal arteries 4. Thromboembolectomy with a #5 Fogarty right popliteal and superficial femoral arteries 5. Percutaneous transluminal angioplasty and stent placement left common iliac artery extending into the external iliac artery with a 9 mm x 40 mm life star stent postdilated to 8 mm with a Lutonix  drug-eluting balloon. 6. Open transluminal angioplasty and stent placement right distal common iliac extending into the external iliac artery with a 9 mm x 40 mm life star stent postdilated to 8 mm with a Lutonix drug-eluting balloon 7. Catheter placement into the right common femoral artery from the left common femoral sheath for angiography of the right lower extremity status post thrombectomy     Relevant past medical, surgical, family and social history reviewed and updated as indicated. Interim medical history since our last visit reviewed. Allergies and medications reviewed and updated. Outpatient Medications Prior to Visit  Medication Sig Dispense Refill  . acetaminophen (TYLENOL) 500 MG tablet Take 1,000 mg by mouth at bedtime.    . Ascorbic Acid (VITAMIN C) 1000 MG tablet Take 1,000 mg by mouth daily.    [name] aspirin 81 MG EC tablet Take 81 mg by mouth daily.      . Calcium-Magnesium-Vitamin D (CALCIUM 1200+D3 PO) Take 1 tablet by mouth daily.    . Cholecalciferol (VITAMIN D3) 50 MCG (2000 UT) TABS Take 2,000 Units by mouth daily.    . clopidogrel (PLAVIX) 75 MG tablet TAKE 1 TABLET BY MOUTH EVERY DAY 90 tablet 3  . ibuprofen (ADVIL,MOTRIN) 200 MG tablet Take 400 mg by mouth every 6 (six) hours as needed for headache or moderate pain.     [name] lisinopril (ZESTRIL) 5 MG tablet Take 1 tablet (5 mg total) by mouth daily. (Patient taking differently: Take 5  mg by mouth every morning. ) 90 tablet 3  . metoprolol succinate (TOPROL-XL) 25 MG 24 hr tablet Take 0.5 tablets (12.5 mg total) by mouth daily. (Patient taking differently: Take 12.5 mg by mouth at bedtime. ) 45 tablet 3  . nitroGLYCERIN (NITROSTAT) 0.4 MG SL tablet Place 1 tablet (0.4 mg total) under the tongue every 5 (five) minutes as needed for chest pain. 25 tablet 3  . NONFORMULARY OR COMPOUNDED ITEM Trimix (30/1/10)-(Pap/Phent/PGE)  Test Dose 3 49ml vials  Qty #3 Refills 0  [facility] [phone] Fax [phone] 3 each  0  . pantoprazole (PROTONIX) 40 MG tablet Take 1 tablet (40 mg total) by mouth daily. (Patient taking differently: Take 40 mg by mouth every evening. ) 30 tablet 11  . Polyethyl Glycol-Propyl Glycol (LUBRICANT EYE DROPS) 0.4-0.3 % SOLN Place 1-2 drops into both eyes 3 (three) times daily as needed (burning eyes.).    [name] simvastatin (ZOCOR) 40 MG tablet Take 1 tablet (40 mg total) by mouth at bedtime. 30 tablet 11  . tamsulosin (FLOMAX) 0.4 MG CAPS capsule TAKE 1 CAPSULE BY MOUTH EVERY DAY AFTER SUPPER 90 capsule 3  . traMADol (ULTRAM) 50 MG tablet TAKE 1 TO 2 TABLETS(50 TO 100 MG) BY MOUTH TWICE DAILY AS NEEDED 30 tablet 0  . vitamin B-12 (CYANOCOBALAMIN) 500 MCG tablet Take 500 mcg by mouth daily.    . vitamin E 1000 UNIT capsule Take 1,000 Units by mouth daily. Vitamin E 450 mg    . Zinc 50 MG TABS Take 50 mg by mouth daily.     No facility-administered medications prior to visit.     Per HPI unless specifically indicated in ROS section below Review of Systems Objective:    BP 136/74 (BP Location: Left Arm, Patient Position: Sitting, Cuff Size: Normal)   Pulse 65   Temp 97.9 F (36.6 C) (Temporal)   Ht 5\' 8"  (1.727 m)   Wt 207 lb 5 oz (94 kg)   SpO2 94%   BMI 31.52 kg/m   Wt Readings from Last 3 Encounters:  10/03/19 207 lb 5 oz (94 kg)  08/16/19 210 lb 8 oz (95.5 kg)  08/08/19 210 lb (95.3 kg)    Physical Exam Vitals and nursing note reviewed.  Constitutional:      Appearance: Normal appearance. He is not ill-appearing.  HENT:     Head: Normocephalic and atraumatic.  Eyes:     Extraocular Movements: Extraocular movements intact.     Pupils: Pupils are equal, round, and reactive to light.  Cardiovascular:     Rate and Rhythm: Normal rate and regular rhythm.     Pulses: Normal pulses.     Heart sounds: Normal heart sounds. No murmur.  Pulmonary:     Effort: Pulmonary effort is normal. No respiratory distress.     Breath sounds: Normal breath sounds. No wheezing, rhonchi  or rales.  Musculoskeletal:     Right lower leg: No edema.     Left lower leg: No edema.  Skin:    General: Skin is warm and dry.     Findings: No rash.  Neurological:     Mental Status: He is alert.  Psychiatric:        Mood and Affect: Mood normal.        Behavior: Behavior normal.        Assessment & Plan:  This visit occurred during the SARS-CoV-2 public health emergency.  Safety protocols were in place, including screening questions prior  to the visit, additional usage of staff PPE, and extensive cleaning of exam room while observing appropriate contact time as indicated for disinfecting solutions.    Problem List Items Addressed This Visit    Postoperative atrial fibrillation ([place])    Sounds regular today. Does not want anticoagulation.       Lumbar spondylosis   Insomnia    This has significantly improved off meds over the last several months, unclear why.       Hypertension    Chronic, stable. Continue low dose metoprolol and lisinopril.       Hyperlipidemia    FLP stable on simvastatin - does not desire further medication.  The ASCVD Risk score Mikey Bussing DC Jr., et al., 2013) failed to calculate for the following reasons:   The valid total cholesterol range is 130 to 320 mg/dL       History of esophagitis    Continues daily PPI.       Chronic fatigue and malaise    This has improved since PAD procedures. He also notes he's sleeping better.       Chronic back pain    On daily tramadol.  States he wants to avoid back surgery although he has been recommended it. Planning to return to back doctor.       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  Overall doing very well post procedure.           No orders of the defined types were placed in this encounter.  No orders of the defined types were placed in this encounter.  Patient Instructions  You are doing well today Continue  current medicines.  Return as needed or in 6 months for physical.    Follow up plan: Return in about 6 months (around 04/03/2020) for annual exam, prior fasting for blood work, medicare wellness visit.  Ria Bush, MD

## 2019-10-03 NOTE — Assessment & Plan Note (Signed)
On daily tramadol.  States he wants to avoid back surgery although he has been recommended it. Planning to return to back doctor.

## 2019-10-03 NOTE — Assessment & Plan Note (Signed)
Continues daily PPI.

## 2019-10-12 ENCOUNTER — Other Ambulatory Visit: Payer: Self-pay | Admitting: Family Medicine

## 2019-10-12 NOTE — Telephone Encounter (Signed)
Name of Medication: Tramadol Name of Pharmacy: [facility] or Written Date and Quantity: 09/13/19, #30 Last Office Visit and Type: 10/03/19, 5 mo f/u Next Office Visit and Type: none Last Controlled Substance Agreement Date: 01/18/16 Last UDS: 01/18/16

## 2019-10-13 NOTE — Telephone Encounter (Signed)
[facility]

## 2019-10-14 DIAGNOSIS — M9901 Segmental and somatic dysfunction of cervical region: Secondary | ICD-10-CM | POA: Diagnosis not present

## 2019-10-14 DIAGNOSIS — M5033 Other cervical disc degeneration, cervicothoracic region: Secondary | ICD-10-CM | POA: Diagnosis not present

## 2019-10-14 DIAGNOSIS — M9902 Segmental and somatic dysfunction of thoracic region: Secondary | ICD-10-CM | POA: Diagnosis not present

## 2019-10-14 DIAGNOSIS — M62830 Muscle spasm of back: Secondary | ICD-10-CM | POA: Diagnosis not present

## 2019-10-25 DIAGNOSIS — M9905 Segmental and somatic dysfunction of pelvic region: Secondary | ICD-10-CM | POA: Diagnosis not present

## 2019-10-25 DIAGNOSIS — M9901 Segmental and somatic dysfunction of cervical region: Secondary | ICD-10-CM | POA: Diagnosis not present

## 2019-10-25 DIAGNOSIS — M5033 Other cervical disc degeneration, cervicothoracic region: Secondary | ICD-10-CM | POA: Diagnosis not present

## 2019-10-25 DIAGNOSIS — M955 Acquired deformity of pelvis: Secondary | ICD-10-CM | POA: Diagnosis not present

## 2019-11-03 DIAGNOSIS — M9905 Segmental and somatic dysfunction of pelvic region: Secondary | ICD-10-CM | POA: Diagnosis not present

## 2019-11-03 DIAGNOSIS — M955 Acquired deformity of pelvis: Secondary | ICD-10-CM | POA: Diagnosis not present

## 2019-11-03 DIAGNOSIS — M5033 Other cervical disc degeneration, cervicothoracic region: Secondary | ICD-10-CM | POA: Diagnosis not present

## 2019-11-03 DIAGNOSIS — M9901 Segmental and somatic dysfunction of cervical region: Secondary | ICD-10-CM | POA: Diagnosis not present

## 2019-11-08 DIAGNOSIS — D2271 Melanocytic nevi of right lower limb, including hip: Secondary | ICD-10-CM | POA: Diagnosis not present

## 2019-11-08 DIAGNOSIS — C44729 Squamous cell carcinoma of skin of left lower limb, including hip: Secondary | ICD-10-CM | POA: Diagnosis not present

## 2019-11-08 DIAGNOSIS — D485 Neoplasm of uncertain behavior of skin: Secondary | ICD-10-CM | POA: Diagnosis not present

## 2019-11-08 DIAGNOSIS — Z85820 Personal history of malignant melanoma of skin: Secondary | ICD-10-CM | POA: Diagnosis not present

## 2019-11-08 DIAGNOSIS — Z85828 Personal history of other malignant neoplasm of skin: Secondary | ICD-10-CM | POA: Diagnosis not present

## 2019-11-08 DIAGNOSIS — D225 Melanocytic nevi of trunk: Secondary | ICD-10-CM | POA: Diagnosis not present

## 2019-11-08 DIAGNOSIS — B360 Pityriasis versicolor: Secondary | ICD-10-CM | POA: Diagnosis not present

## 2019-11-08 DIAGNOSIS — L570 Actinic keratosis: Secondary | ICD-10-CM | POA: Diagnosis not present

## 2019-11-08 DIAGNOSIS — D2261 Melanocytic nevi of right upper limb, including shoulder: Secondary | ICD-10-CM | POA: Diagnosis not present

## 2019-11-08 DIAGNOSIS — X32XXXA Exposure to sunlight, initial encounter: Secondary | ICD-10-CM | POA: Diagnosis not present

## 2019-11-08 DIAGNOSIS — D2262 Melanocytic nevi of left upper limb, including shoulder: Secondary | ICD-10-CM | POA: Diagnosis not present

## 2019-11-09 ENCOUNTER — Other Ambulatory Visit: Payer: Self-pay | Admitting: Family Medicine

## 2019-11-09 NOTE — Telephone Encounter (Signed)
Name of Medication: Tramadol Name of Pharmacy: [facility] or Written Date and Quantity: 10/13/19, #30 Last Office Visit and Type: 10/03/19, 5 mo f/u Next Office Visit and Type: none Last Controlled Substance Agreement Date: none Last UDS: 01/18/16, scanned

## 2019-11-11 NOTE — Telephone Encounter (Signed)
ERx 

## 2019-11-16 DIAGNOSIS — M5033 Other cervical disc degeneration, cervicothoracic region: Secondary | ICD-10-CM | POA: Diagnosis not present

## 2019-11-16 DIAGNOSIS — M955 Acquired deformity of pelvis: Secondary | ICD-10-CM | POA: Diagnosis not present

## 2019-11-16 DIAGNOSIS — M9901 Segmental and somatic dysfunction of cervical region: Secondary | ICD-10-CM | POA: Diagnosis not present

## 2019-11-16 DIAGNOSIS — M9905 Segmental and somatic dysfunction of pelvic region: Secondary | ICD-10-CM | POA: Diagnosis not present

## 2019-11-17 DIAGNOSIS — I2510 Atherosclerotic heart disease of native coronary artery without angina pectoris: Secondary | ICD-10-CM | POA: Diagnosis not present

## 2019-11-17 DIAGNOSIS — M9901 Segmental and somatic dysfunction of cervical region: Secondary | ICD-10-CM | POA: Diagnosis not present

## 2019-11-17 DIAGNOSIS — M5033 Other cervical disc degeneration, cervicothoracic region: Secondary | ICD-10-CM | POA: Diagnosis not present

## 2019-11-17 DIAGNOSIS — M9905 Segmental and somatic dysfunction of pelvic region: Secondary | ICD-10-CM | POA: Diagnosis not present

## 2019-11-17 DIAGNOSIS — M955 Acquired deformity of pelvis: Secondary | ICD-10-CM | POA: Diagnosis not present

## 2019-11-21 ENCOUNTER — Other Ambulatory Visit: Payer: Self-pay

## 2019-11-21 ENCOUNTER — Encounter: Payer: Self-pay | Admitting: Ophthalmology

## 2019-11-21 NOTE — Anesthesia Preprocedure Evaluation (Addendum)
Anesthesia Evaluation  Patient identified by MRN, date of birth, ID band Patient awake    History of Anesthesia Complications Negative for: history of anesthetic complications  Airway Mallampati: II  TM Distance: >3 FB Neck ROM: Full    Dental no notable dental hx.    Pulmonary sleep apnea , former smoker,    Pulmonary exam normal        Cardiovascular Exercise Tolerance: Good hypertension, + CAD, + Past MI, + CABG (2016) and + Peripheral Vascular Disease  Normal cardiovascular exam+ dysrhythmias (hx post CABG afib, resolved)   Patient is very physically active, goes to the gym 7 days per week  Cath 06/10/17: Ost LAD lesion is 80% stenosed. 1st Mrg lesion is 70% stenosed. 2nd Mrg-2 lesion is 70% stenosed. 2nd Mrg-1 lesion is 70% stenosed. Dist RCA lesion is 70% stenosed. Mid LAD lesion is 90% stenosed. Origin to Prox Graft lesion is 100% stenosed. Mid RCA-1 lesion is 60% stenosed. Mid RCA-2 lesion is 90% stenosed. The left ventricular ejection fraction is 55-65% by visual estimate. There is no aortic valve stenosis. There is no aortic valve regurgitation.    Vascular surgery 06/15/2019: Lower extremity embolectomy, iliac vein angioplasty/stenting. Tolerated procedure and anesthesia well.   Neuro/Psych  Headaches,    GI/Hepatic hiatal hernia, GERD  ,  Endo/Other  Obesity - BMI 30  Renal/GU      Musculoskeletal  (+) Arthritis ,   Abdominal   Peds  Hematology   Anesthesia Other Findings Cardiology note ([name]) 08/16/19: "Currently with no symptoms of angina. No further workup at this time. Continue current medication regimen."  Reproductive/Obstetrics                            Anesthesia Physical Anesthesia Plan  ASA: III  Anesthesia Plan: MAC   Post-op Pain Management:    Induction: Intravenous  PONV Risk Score and Plan: 1 and TIVA, Propofol infusion and Treatment  may vary due to age or medical condition  Airway Management Planned: Nasal Cannula and Natural Airway  Additional Equipment: None  Intra-op Plan:   Post-operative Plan:   Informed Consent: I have reviewed the patients History and Physical, chart, labs and discussed the procedure including the risks, benefits and alternatives for the proposed anesthesia with the patient or authorized representative who has indicated his/her understanding and acceptance.       Plan Discussed with: CRNA  Anesthesia Plan Comments:         Anesthesia Quick Evaluation

## 2019-11-23 ENCOUNTER — Other Ambulatory Visit: Payer: Self-pay

## 2019-11-23 ENCOUNTER — Other Ambulatory Visit
Admission: RE | Admit: 2019-11-23 | Discharge: 2019-11-23 | Disposition: A | Discharge status: Home / Self Care | Payer: PPO | Source: Ambulatory Visit | Attending: Ophthalmology | Admitting: Ophthalmology

## 2019-11-23 DIAGNOSIS — Z01812 Encounter for preprocedural laboratory examination: Secondary | ICD-10-CM | POA: Insufficient documentation

## 2019-11-23 DIAGNOSIS — Z20822 Contact with and (suspected) exposure to covid-19: Secondary | ICD-10-CM | POA: Insufficient documentation

## 2019-11-24 LAB — SARS CORONAVIRUS 2 (TAT 6-24 HRS): SARS Coronavirus 2: NEGATIVE

## 2019-11-25 ENCOUNTER — Ambulatory Visit: Payer: PPO | Admitting: Anesthesiology

## 2019-11-25 ENCOUNTER — Other Ambulatory Visit: Payer: Self-pay

## 2019-11-25 ENCOUNTER — Ambulatory Visit
Admission: RE | Admit: 2019-11-25 | Discharge: 2019-11-25 | Disposition: A | Discharge status: Home / Self Care | Payer: PPO | Attending: Ophthalmology | Admitting: Ophthalmology

## 2019-11-25 ENCOUNTER — Encounter
Admission: RE | Disposition: A | Discharge status: Home / Self Care | Payer: Self-pay | Source: Home / Self Care | Attending: Ophthalmology

## 2019-11-25 ENCOUNTER — Encounter: Payer: Self-pay | Admitting: Ophthalmology

## 2019-11-25 DIAGNOSIS — Z6830 Body mass index (BMI) 30.0-30.9, adult: Secondary | ICD-10-CM | POA: Insufficient documentation

## 2019-11-25 DIAGNOSIS — Z7982 Long term (current) use of aspirin: Secondary | ICD-10-CM | POA: Insufficient documentation

## 2019-11-25 DIAGNOSIS — E7800 Pure hypercholesterolemia, unspecified: Secondary | ICD-10-CM | POA: Diagnosis not present

## 2019-11-25 DIAGNOSIS — Z955 Presence of coronary angioplasty implant and graft: Secondary | ICD-10-CM | POA: Diagnosis not present

## 2019-11-25 DIAGNOSIS — L574 Cutis laxa senilis: Secondary | ICD-10-CM | POA: Diagnosis not present

## 2019-11-25 DIAGNOSIS — M1990 Unspecified osteoarthritis, unspecified site: Secondary | ICD-10-CM | POA: Insufficient documentation

## 2019-11-25 DIAGNOSIS — I2510 Atherosclerotic heart disease of native coronary artery without angina pectoris: Secondary | ICD-10-CM | POA: Insufficient documentation

## 2019-11-25 DIAGNOSIS — Z87891 Personal history of nicotine dependence: Secondary | ICD-10-CM | POA: Insufficient documentation

## 2019-11-25 DIAGNOSIS — Z79899 Other long term (current) drug therapy: Secondary | ICD-10-CM | POA: Insufficient documentation

## 2019-11-25 DIAGNOSIS — Z951 Presence of aortocoronary bypass graft: Secondary | ICD-10-CM | POA: Diagnosis not present

## 2019-11-25 DIAGNOSIS — I10 Essential (primary) hypertension: Secondary | ICD-10-CM | POA: Insufficient documentation

## 2019-11-25 DIAGNOSIS — H57813 Brow ptosis, bilateral: Secondary | ICD-10-CM | POA: Diagnosis not present

## 2019-11-25 DIAGNOSIS — H02403 Unspecified ptosis of bilateral eyelids: Secondary | ICD-10-CM | POA: Diagnosis not present

## 2019-11-25 DIAGNOSIS — E669 Obesity, unspecified: Secondary | ICD-10-CM | POA: Diagnosis not present

## 2019-11-25 HISTORY — PX: BROW LIFT: SHX178

## 2019-11-25 SURGERY — BLEPHAROPLASTY
Anesthesia: Monitor Anesthesia Care | Site: Eye | Laterality: Bilateral

## 2019-11-25 MED ORDER — ALFENTANIL 500 MCG/ML IJ INJ
INJECTION | INTRAVENOUS | Status: DC | PRN
  Administered 2019-11-25: 100 ug via INTRAVENOUS
  Administered 2019-11-25: 400 ug via INTRAVENOUS

## 2019-11-25 MED ORDER — ACETAMINOPHEN 325 MG PO TABS: 325.0000 mg | ORAL_TABLET | ORAL | Status: DC | PRN

## 2019-11-25 MED ORDER — LACTATED RINGERS IV SOLN: INTRAVENOUS | Status: DC

## 2019-11-25 MED ORDER — OXYCODONE HCL 5 MG PO TABS: 5.0000 mg | ORAL_TABLET | Freq: Once | ORAL | Status: DC | PRN

## 2019-11-25 MED ORDER — ACETAMINOPHEN 160 MG/5ML PO SOLN: 325.0000 mg | ORAL | Status: DC | PRN

## 2019-11-25 MED ORDER — TRAMADOL HCL 50 MG PO TABS: ORAL_TABLET | ORAL | 0 refills | Status: DC

## 2019-11-25 MED ORDER — LIDOCAINE-EPINEPHRINE 2 %-1:100000 IJ SOLN
INTRAMUSCULAR | Status: DC | PRN
  Administered 2019-11-25: 5 mL via OPHTHALMIC

## 2019-11-25 MED ORDER — OXYCODONE HCL 5 MG/5ML PO SOLN: 5.0000 mg | Freq: Once | ORAL | Status: DC | PRN

## 2019-11-25 MED ORDER — TETRACAINE HCL 0.5 % OP SOLN
OPHTHALMIC | Status: DC | PRN
  Administered 2019-11-25: 2 [drp] via OPHTHALMIC

## 2019-11-25 MED ORDER — ERYTHROMYCIN 5 MG/GM OP OINT
TOPICAL_OINTMENT | OPHTHALMIC | Status: DC | PRN
  Administered 2019-11-25: 1 via OPHTHALMIC

## 2019-11-25 MED ORDER — BSS IO SOLN
INTRAOCULAR | Status: DC | PRN
  Administered 2019-11-25: 15 mL

## 2019-11-25 MED ORDER — MIDAZOLAM HCL 2 MG/2ML IJ SOLN
INTRAMUSCULAR | Status: DC | PRN
  Administered 2019-11-25: 1 mg via INTRAVENOUS

## 2019-11-25 SURGICAL SUPPLY — 39 items
APPLICATOR COTTON TIP WD 3 STR (MISCELLANEOUS) ×2 IMPLANT
BLADE SURG 15 STRL LF DISP TIS (BLADE) ×1 IMPLANT
BLADE SURG 15 STRL SS (BLADE) ×2
CORD BIP STRL DISP 12FT (MISCELLANEOUS) ×2 IMPLANT
DRAPE HEAD BAR (DRAPES) ×2 IMPLANT
GAUZE SPONGE 4X4 12PLY STRL (GAUZE/BANDAGES/DRESSINGS) ×2 IMPLANT
GLOVE BIO SURGEON STRL SZ7.5 (GLOVE) ×2 IMPLANT
GLOVE SURG LX 7.0 MICRO (GLOVE) ×2
GLOVE SURG LX STRL 7.0 MICRO (GLOVE) ×2 IMPLANT
GOWN STRL REUS W/ TWL LRG LVL3 (GOWN DISPOSABLE) ×1 IMPLANT
GOWN STRL REUS W/TWL LRG LVL3 (GOWN DISPOSABLE) ×2
MARKER SKIN XFINE TIP W/RULER (MISCELLANEOUS) ×2 IMPLANT
NDL FILTER BLUNT 18X1 1/2 (NEEDLE) ×1 IMPLANT
NDL HYPO 30X.5 LL (NEEDLE) ×2 IMPLANT
NEEDLE FILTER BLUNT 18X 1/2SAF (NEEDLE) ×1
NEEDLE FILTER BLUNT 18X1 1/2 (NEEDLE) ×1 IMPLANT
NEEDLE HYPO 30X.5 LL (NEEDLE) ×4 IMPLANT
PACK ENT CUSTOM (PACKS) ×2 IMPLANT
SOL PREP PVP 2OZ (MISCELLANEOUS) ×2
SOLUTION PREP PVP 2OZ (MISCELLANEOUS) ×1 IMPLANT
SPONGE GAUZE 2X2 8PLY STRL LF (GAUZE/BANDAGES/DRESSINGS) ×20 IMPLANT
SUT CHROMIC 4-0 (SUTURE)
SUT CHROMIC 4-0 M2 12X2 ARM (SUTURE)
SUT CHROMIC 5 0 P 3 (SUTURE) ×2 IMPLANT
SUT ETHILON 4 0 CL P 3 (SUTURE) IMPLANT
SUT GUT PLAIN 6-0 1X18 ABS (SUTURE) ×2 IMPLANT
SUT MERSILENE 4-0 S-2 (SUTURE) IMPLANT
SUT PROLENE 5 0 P 3 (SUTURE) ×2 IMPLANT
SUT PROLENE 6 0 P 1 18 (SUTURE) IMPLANT
SUT SILK 4 0 G 3 (SUTURE) IMPLANT
SUT VIC AB 5-0 P-3 18X BRD (SUTURE) IMPLANT
SUT VIC AB 5-0 P3 18 (SUTURE)
SUT VICRYL 6-0  S14 CTD (SUTURE)
SUT VICRYL 6-0 S14 CTD (SUTURE) IMPLANT
SUT VICRYL 7 0 TG140 8 (SUTURE) IMPLANT
SUTURE CHRMC 4-0 M2 12X2 ARM (SUTURE) IMPLANT
SYR 10ML LL (SYRINGE) ×2 IMPLANT
SYR 3ML LL SCALE MARK (SYRINGE) ×2 IMPLANT
WATER STERILE IRR 250ML POUR (IV SOLUTION) ×2 IMPLANT

## 2019-11-25 NOTE — Op Note (Signed)
Preoperative Diagnosis:   Visually significant bilateral brow ptosis.    Postoperative Diagnosis: Same.   Procedure(s) Performed:    bilateral  Direct brow lift to improve vision.    Surgeon: Philis Pique. Vickki Muff, M.D.   Assistants: None   Anesthesia: MAC  Specimens: None.  Estimated Blood Loss: Minimal.  Complications: None.  Operative Findings: None Dictated  PROCEDURE:   Allergies were reviewed and the patient has No Known Allergies..   After the risks, benefits, complications and alternatives were discussed with the patient, appropriate informed consent was obtained. While seated in an upright position and looking in primary gaze, the amount of supra-brow skin to be removed was measured and marked in an elliptical pattern. The patient was then brought to the operating suite and reclined supine.  Timeout was conducted and the patient was sedated. Local anesthetic consisting of a 50-50 mixture of 2% lidocaine with epinephrine and 0.75% bupivacaine with added Hylenex was injected subcutaneously to the both  brow region(s) and down to the periosteum. After adequate local was instilled, the patient was prepped and draped in the usual sterile fashion for eyelid surgery.   Attention was turned to the right brow region. A #15 blade was used to create a bevelled incision along the premarked incision line. A skin and subcutaneous tissue flap was then excised and hemostasis was obtained with bipolar cautery. The deep tissues were reapproximated with interrupted vertical 5-0 chromic sutures. The skin margin was reapproximated with a running locking 5-0 Prolene suture. Attention was then turned to the opposite brow region where the same procedure was performed in the same manner.    The patient tolerated the procedure well. Erythromycin ophthalmic ointment was applied to the incision site(s) followed by ice packs.The patient was taken to the recovery area where he recovered without  difficulty.  Post-Op Plan/Instructions:  The patient was instructed to use ice packs frequently for the next 48 hours.  he was instructed to use over-the-counter antibiotic ointment on the brow sutures 4 times a day for the next 12 to 14 days. he was given a prescription for Tramadol (or similar) for pain control should Tylenol not be effective. he was asked to to follow up in 10-12 days time for suture removal or sooner as needed for problems.   Nyeem Stoke M. Vickki Muff, M.D. Ophthalmology

## 2019-11-25 NOTE — Anesthesia Postprocedure Evaluation (Signed)
Anesthesia Post Note  Patient: Randall Ali.  Procedure(s) Performed: BROW PTOSIS REPAIR BILATERAL (Bilateral Eye)     Patient location during evaluation: PACU Anesthesia Type: MAC Level of consciousness: awake and alert Pain management: pain level controlled Vital Signs Assessment: post-procedure vital signs reviewed and stable Respiratory status: spontaneous breathing Cardiovascular status: blood pressure returned to baseline Postop Assessment: no apparent nausea or vomiting, adequate PO intake and no headache Anesthetic complications: no   No complications documented.  Adele Barthel Retta Pitcher

## 2019-11-25 NOTE — Interval H&P Note (Signed)
History and Physical Interval Note:  11/25/2019 11:19 AM  Randall Lofts.  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  The various methods of treatment have been discussed with the patient and family. After consideration of risks, benefits and other options for treatment, the patient has consented to  Procedure(s) with comments: [place] (Bilateral) - sleep apnea as a surgical intervention.  [name] history has been reviewed, patient examined, no change in status, stable for surgery.  I have reviewed the patient's chart and labs.  Questions were answered to the patient's satisfaction.     Vickki Muff, [name]

## 2019-11-25 NOTE — Anesthesia Procedure Notes (Signed)
Performed by: Graciana Sessa, CRNA Pre-anesthesia Checklist: Patient identified, Emergency Drugs available, Suction available, Timeout performed and Patient being monitored Patient Re-evaluated:Patient Re-evaluated prior to induction Oxygen Delivery Method: Nasal cannula Placement Confirmation: positive ETCO2       

## 2019-11-25 NOTE — Discharge Instructions (Signed)
INSTRUCTIONS FOLLOWING OCULOPLASTIC SURGERY Randall Ali Dennie Maizes, MD  AFTER YOUR EYE SURGERY, THER ARE MANY THINGS WHICH YOU, THE PATIENT, CAN DO TO ASSURE THE BEST POSSIBLE RESULT FROM YOUR OPERATION.  THIS SHEET SHOULD BE REFERRED TO WHENEVER QUESTIONS ARISE.  IF THERE ARE ANY QUESTIONS NOT ANSWERED HERE, DO NOT HESITATE TO CALL OUR OFFICE AT [phone] OR [phone].  THERE IS ALWAYS SOMEONE AVAILABLE TO CALL IF QUESTIONS OR PROBLEMS ARISE.  VISION: Your vision may be blurred and out of focus after surgery until you are able to stop using your ointment, swelling resolves and your eye(s) heal. This may take 1 to 2 weeks at the least.  If your vision becomes gradually more dim or dark, this is not normal and you need to call our office immediately.  EYE CARE: For the first 48 hours after surgery, use ice packs frequently - "20 minutes on, 20 minutes off" - to help reduce swelling and bruising.  Small bags of frozen peas or corn make good ice packs along with cloths soaked in ice water.  If you are wearing a patch or other type of dressing following surgery, keep this on for the amount of time specified by your doctor.  For the first week following surgery, you will need to treat your stitches with great care.  It is OK to shower, but take care to not allow soapy water to run into your eye(s) to help reduce chances of infection.  You may gently clean the eyelashes and around the eye(s) with cotton balls and sterile water, BUT DO NOT RUB THE STITCHES VIGOROUSLY.  Keeping your stitches moist with ointment will help promote healing with minimal scar formation.  ACTIVITY: When you leave the surgery center, you should go home, rest and be inactive.  The eye(s) may feel scratchy and keeping the eyes closed will allow for faster healing.  The first week following surgery, avoid straining (anything making the face turn red) or lifting over 20 pounds.  Additionally, avoid bending which causes your head to go below  your waist.  Using your eyes will NOT harm them, so feel free to read, watch television, use the computer, etc as desired.  Driving depends on each individual, so check with your doctor if you have questions about driving. Do not wear contact lenses for about 2 weeks.  Do not wear eye makeup for 2 weeks.  Avoid swimming, hot tubs, gardening, and dusting for 1 to 2 weeks to reduce the risk of an infection.  MEDICATIONS:  Use over-the-counter antibiotic ointment 4 times a day on your stitches for 2 weeks.  You can use artificial tears in your eyes if they feel scratchy or irritated.    EMERGENCY: If you experience SEVERE EYE PAIN OR HEADACHE UNRELIEVED BY TYLENOL OR TRAMADOL, NAUSEA OR VOMITING, WORSENING REDNESS, OR WORSENING VISION (ESPECIALLY VISION THAT WAS INITIALLY BETTER) CALL [phone] OR [phone] DURING BUSINESS HOURS OR AFTER HOURS.  General Anesthesia, Adult, Care After This sheet gives you information about how to care for yourself after your procedure. Your health care provider may also give you more specific instructions. If you have problems or questions, contact your health care provider. What can I expect after the procedure? After the procedure, the following side effects are common:  Pain or discomfort at the IV site.  Nausea.  Vomiting.  Sore throat.  Trouble concentrating.  Feeling cold or chills.  Weak or tired.  Sleepiness and fatigue.  Soreness and body aches. These side effects  can affect parts of the body that were not involved in surgery. Follow these instructions at home:  For at least 24 hours after the procedure:  Have a responsible adult stay with you. It is important to have someone help care for you until you are awake and alert.  Rest as needed.  Do not: ? Participate in activities in which you could fall or become injured. ? Drive. ? Use heavy machinery. ? Drink alcohol. ? Take sleeping pills or medicines that cause  drowsiness. ? Make important decisions or sign legal documents. ? Take care of children on your own. Eating and drinking  Follow any instructions from your health care provider about eating or drinking restrictions.  When you feel hungry, start by eating small amounts of foods that are soft and easy to digest (bland), such as toast. Gradually return to your regular diet.  Drink enough fluid to keep your urine pale yellow.  If you vomit, rehydrate by drinking water, juice, or clear broth. General instructions  If you have sleep apnea, surgery and certain medicines can increase your risk for breathing problems. Follow instructions from your health care provider about wearing your sleep device: ? Anytime you are sleeping, including during daytime naps. ? While taking prescription pain medicines, sleeping medicines, or medicines that make you drowsy.  Return to your normal activities as told by your health care provider. Ask your health care provider what activities are safe for you.  Take over-the-counter and prescription medicines only as told by your health care provider.  If you smoke, do not smoke without supervision.  Keep all follow-up visits as told by your health care provider. This is important. Contact a health care provider if:  You have nausea or vomiting that does not get better with medicine.  You cannot eat or drink without vomiting.  You have pain that does not get better with medicine.  You are unable to pass urine.  You develop a skin rash.  You have a fever.  You have redness around your IV site that gets worse. Get help right away if:  You have difficulty breathing.  You have chest pain.  You have blood in your urine or stool, or you vomit blood. Summary  After the procedure, it is common to have a sore throat or nausea. It is also common to feel tired.  Have a responsible adult stay with you for the first 24 hours after general anesthesia. It is  important to have someone help care for you until you are awake and alert.  When you feel hungry, start by eating small amounts of foods that are soft and easy to digest (bland), such as toast. Gradually return to your regular diet.  Drink enough fluid to keep your urine pale yellow.  Return to your normal activities as told by your health care provider. Ask your health care provider what activities are safe for you. This information is not intended to replace advice given to you by your health care provider. Make sure you discuss any questions you have with your health care provider. Document Revised: 05/29/2017 Document Reviewed: 01/09/2017 Elsevier Patient Education  [place].

## 2019-11-25 NOTE — H&P (Signed)
See the history and physical completed at [facility] on 11/17/19 and scanned into the chart.

## 2019-11-25 NOTE — Transfer of Care (Signed)
Immediate Anesthesia Transfer of Care Note  Patient: Randall Ali.  Procedure(s) Performed: BROW PTOSIS REPAIR BILATERAL (Bilateral Eye)  Patient Location: PACU  Anesthesia Type: MAC  Level of Consciousness: awake, alert  and patient cooperative  Airway and Oxygen Therapy: Patient Spontanous Breathing and Patient connected to supplemental oxygen  Post-op Assessment: Post-op Vital signs reviewed, Patient's Cardiovascular Status Stable, Respiratory Function Stable, Patent Airway and No signs of Nausea or vomiting  Post-op Vital Signs: Reviewed and stable  Complications: No complications documented.

## 2019-11-28 ENCOUNTER — Encounter: Payer: Self-pay | Admitting: Ophthalmology

## 2019-12-02 DIAGNOSIS — G4733 Obstructive sleep apnea (adult) (pediatric): Secondary | ICD-10-CM | POA: Diagnosis not present

## 2019-12-09 ENCOUNTER — Telehealth: Payer: Self-pay

## 2019-12-09 NOTE — Telephone Encounter (Addendum)
Tramadol comes in 50mg  and 100mg   It is currently prescribed as 50-100mg  BID PRN, not necessarily for daily use.  Would want him to take if daily tylenol not controlling pain.  How long is tramadol 50mg  lasting him? There is an extended release tramadol but would need to see if insurance would cover.

## 2019-12-09 NOTE — Addendum Note (Signed)
Addended by: Ria Bush on: 12/09/2019 07:20 PM   Modules accepted: Orders

## 2019-12-09 NOTE — Telephone Encounter (Signed)
Patient contacted the office and states that he is wanting his tramadol dose increased. He states his current dose is 30mg , and he is having to take this twice daily. Patient is wondering if he can be prescribed a 60mg  tablet to take once a day? Dr. Darnell Level, I only see a 50mg  tablet in the patient's chart for tramadol.  Please advise.

## 2019-12-13 MED ORDER — TRAMADOL HCL 100 MG PO TABS: 100.0000 mg | ORAL_TABLET | Freq: Every day | ORAL | 0 refills | Status: DC | PRN

## 2019-12-13 NOTE — Telephone Encounter (Addendum)
[place] CSRS reviewed.  plz notify I've sent in higher tramadol dose 100mg  to use once daily as needed.  This is double previous dose - please only take when at home, don't take and drive.  Update [place] with effect. Please schedule OV if having ongoing trouble with pain control.

## 2019-12-13 NOTE — Telephone Encounter (Signed)
Left message on vm per dpr relaying Dr. Synthia Innocent message.  Asked pt to call back letting Dr. Darnell Level know how long the tramadol 50 mg lasts.

## 2019-12-13 NOTE — Telephone Encounter (Signed)
Spoke with pt relaying Dr. G's message. Pt verbalizes understanding.  

## 2019-12-13 NOTE — Telephone Encounter (Signed)
Patient returned phone call and states that 1 tablet of tramadol typically lasts him 1.5 hours. Will route back to [place].

## 2019-12-13 NOTE — Addendum Note (Signed)
Addended by: Ria Bush on: 12/13/2019 04:13 PM   Modules accepted: Orders

## 2019-12-13 NOTE — Telephone Encounter (Signed)
Noted.  FYI to Dr. Darnell Level.

## 2019-12-15 ENCOUNTER — Other Ambulatory Visit (INDEPENDENT_AMBULATORY_CARE_PROVIDER_SITE_OTHER): Payer: Self-pay | Admitting: Nurse Practitioner

## 2019-12-15 DIAGNOSIS — I6523 Occlusion and stenosis of bilateral carotid arteries: Secondary | ICD-10-CM

## 2019-12-16 ENCOUNTER — Ambulatory Visit (INDEPENDENT_AMBULATORY_CARE_PROVIDER_SITE_OTHER): Payer: PPO | Admitting: Family Medicine

## 2019-12-16 ENCOUNTER — Other Ambulatory Visit: Payer: Self-pay

## 2019-12-16 ENCOUNTER — Telehealth: Payer: Self-pay

## 2019-12-16 VITALS — BP 110/70 | HR 70 | Temp 98.2°F | Ht 68.0 in | Wt 206.5 lb

## 2019-12-16 DIAGNOSIS — R0989 Other specified symptoms and signs involving the circulatory and respiratory systems: Secondary | ICD-10-CM

## 2019-12-16 DIAGNOSIS — I70213 Atherosclerosis of native arteries of extremities with intermittent claudication, bilateral legs: Secondary | ICD-10-CM

## 2019-12-16 DIAGNOSIS — R351 Nocturia: Secondary | ICD-10-CM | POA: Diagnosis not present

## 2019-12-16 DIAGNOSIS — N401 Enlarged prostate with lower urinary tract symptoms: Secondary | ICD-10-CM

## 2019-12-16 DIAGNOSIS — R42 Dizziness and giddiness: Secondary | ICD-10-CM

## 2019-12-16 MED ORDER — TAMSULOSIN HCL 0.4 MG PO CAPS: ORAL_CAPSULE | ORAL | 0 refills | Status: DC

## 2019-12-16 NOTE — Telephone Encounter (Signed)
Pt said on and off for 1 wk had dizziness which is worse at night; has to hold to something so will not fall. Pt had before but went away;No H/A,CP or SOB.Pt has no covid symptoms, no travel and no known exposure to + covid. Pt scheduled appt with [name] today at 2 PM. UC & ED precautions given and pt voiced understanding.

## 2019-12-16 NOTE — Assessment & Plan Note (Addendum)
Anticipate due to orthostatic hypotension (10 pt drop in diastolic). Flomax a likely culprit - will stop this as he's not sure how much benefit he's receiving from med in the first place. Discussed importance of good hydration status, discussed using compression stockings if tolerated (h/o PAD s/p stent). If persistent, consider changing antihypertensive dose.

## 2019-12-16 NOTE — Progress Notes (Signed)
This visit was conducted in person.  BP 110/70 (BP Location: Left Arm, Patient Position: Sitting, Cuff Size: Normal)   Pulse 70   Temp 98.2 F (36.8 C) (Temporal)   Ht 5\' 8"  (1.727 m)   Wt 206 lb 8 oz (93.7 kg)   SpO2 92%   BMI 31.40 kg/m   Orthostatic VS for the past 24 hrs (Last 3 readings):  BP- Lying BP- Standing at 0 minutes  12/16/19 1352 -- 118/60  12/16/19 1350 122/70 --    CC: dizziness Subjective:    Patient ID: Randall Lofts., male    DOB: [birth year], 79 y.o.   MRN: [id number]  HPI: Randall Spitler. is a 79 y.o. male presenting on 12/16/2019 for Dizziness (C/o dizziness, usually occurs when going to standing position. Started about 1 wk ago. )   Noticing increasing dizziness over the past 4-5 days, worse at night time during episode of nocturia. Describes lightheadedness/presyncope. No syncope, vertigo. Also noted during the day. He feels he stays well hydrated every day - with 5-6 daily bottles of water   Worried BiPAP 20/10 for OSA may be causing headaches. He never feels much benefit from BiPAP use.   On flomax for at least the past year.  Also on low dose lisinopril and metoprolol daily.   Upcoming vascular surgery appt with carotid US.      Relevant past medical, surgical, family and social history reviewed and updated as indicated. Interim medical history since our last visit reviewed. Allergies and medications reviewed and updated. Outpatient Medications Prior to Visit  Medication Sig Dispense Refill  . acetaminophen (TYLENOL) 500 MG tablet Take 1,000 mg by mouth at bedtime.    . Ascorbic Acid (VITAMIN C) 1000 MG tablet Take 1,000 mg by mouth daily.    [name] aspirin 81 MG EC tablet Take 81 mg by mouth daily.      . Calcium-Magnesium-Vitamin D (CALCIUM 1200+D3 PO) Take 1 tablet by mouth daily.    . Cholecalciferol (VITAMIN D3) 50 MCG (2000 UT) TABS Take 2,000 Units by mouth daily.    . clopidogrel (PLAVIX) 75 MG tablet TAKE 1 TABLET BY MOUTH EVERY DAY  90 tablet 3  . ibuprofen (ADVIL,MOTRIN) 200 MG tablet Take 400 mg by mouth every 6 (six) hours as needed for headache or moderate pain.     [name] lisinopril (ZESTRIL) 5 MG tablet Take 1 tablet (5 mg total) by mouth daily. (Patient taking differently: Take 5 mg by mouth every morning. ) 90 tablet 3  . metoprolol succinate (TOPROL-XL) 25 MG 24 hr tablet Take 0.5 tablets (12.5 mg total) by mouth daily. (Patient taking differently: Take 12.5 mg by mouth at bedtime. ) 45 tablet 3  . nitroGLYCERIN (NITROSTAT) 0.4 MG SL tablet Place 1 tablet (0.4 mg total) under the tongue every 5 (five) minutes as needed for chest pain. 25 tablet 3  . NONFORMULARY OR COMPOUNDED ITEM Trimix (30/1/10)-(Pap/Phent/PGE)  Test Dose 3 35ml vials  Qty #3 Refills 0  [place] [phone] Fax [phone] 3 each 0  . pantoprazole (PROTONIX) 40 MG tablet Take 1 tablet (40 mg total) by mouth daily. (Patient taking differently: Take 40 mg by mouth every evening. ) 30 tablet 11  . Polyethyl Glycol-Propyl Glycol (LUBRICANT EYE DROPS) 0.4-0.3 % SOLN Place 1-2 drops into both eyes 3 (three) times daily as needed (burning eyes.).    [name] simvastatin (ZOCOR) 40 MG tablet Take 1 tablet (40 mg total) by mouth at bedtime. [place]  tablet 11  . traMADol HCl 100 MG TABS Take 100 mg by mouth daily as needed. 30 tablet 0  . vitamin B-12 (CYANOCOBALAMIN) 500 MCG tablet Take 500 mcg by mouth daily.    . vitamin E 1000 UNIT capsule Take 1,000 Units by mouth daily. Vitamin E 450 mg    . Zinc 50 MG TABS Take 50 mg by mouth daily.    . tamsulosin (FLOMAX) 0.4 MG CAPS capsule TAKE 1 CAPSULE BY MOUTH EVERY DAY AFTER SUPPER 90 capsule 3   No facility-administered medications prior to visit.     Per HPI unless specifically indicated in ROS section below Review of Systems Objective:  BP 110/70 (BP Location: Left Arm, Patient Position: Sitting, Cuff Size: Normal)   Pulse 70   Temp 98.2 F (36.8 C) (Temporal)   Ht 5\' 8"  (1.727 m)   Wt 206 lb 8  oz (93.7 kg)   SpO2 92%   BMI 31.40 kg/m   Wt Readings from Last 3 Encounters:  12/16/19 206 lb 8 oz (93.7 kg)  11/25/19 201 lb (91.2 kg)  10/03/19 207 lb 5 oz (94 kg)      Physical Exam Vitals and nursing note reviewed.  Constitutional:      Appearance: Normal appearance. He is not ill-appearing.  Neck:     Vascular: Carotid bruit (R>L) present.  Cardiovascular:     Rate and Rhythm: Normal rate and regular rhythm.     Pulses: Normal pulses.     Heart sounds: Normal heart sounds. No murmur heard.   Pulmonary:     Effort: Pulmonary effort is normal. No respiratory distress.     Breath sounds: Normal breath sounds. No wheezing, rhonchi or rales.  Musculoskeletal:     Right lower leg: No edema.     Left lower leg: No edema.  Neurological:     Mental Status: He is alert.  Psychiatric:        Mood and Affect: Mood normal.        Behavior: Behavior normal.       Assessment & Plan:  This visit occurred during the SARS-CoV-2 public health emergency.  Safety protocols were in place, including screening questions prior to the visit, additional usage of staff PPE, and extensive cleaning of exam room while observing appropriate contact time as indicated for disinfecting solutions.   Problem List Items Addressed This Visit    Orthostatic dizziness - Primary    Anticipate due to orthostatic hypotension (10 pt drop in diastolic). Flomax a likely culprit - will stop this as he's not sure how much benefit he's receiving from med in the first place. Discussed importance of good hydration status, discussed using compression stockings if tolerated (h/o PAD s/p stent). If persistent, consider changing antihypertensive dose.       Carotid bruit    Upcoming [facility] appt       BPH (benign prostatic hyperplasia)    Stop flomax - may be contributing to orthostatic dizziness.  Monitor effect on urination - if worsening, discussed possibly adding finasteride       Atherosclerosis of native  arteries of extremity with intermittent claudication (HCC)       Meds ordered this encounter  Medications  . DISCONTD: tamsulosin (FLOMAX) 0.4 MG CAPS capsule    Sig: STOP FLOMAX    Dispense:  1 capsule    Refill:  0   No orders of the defined types were placed in this encounter.   Patient instructions: The flomax may  be contributing to the dizzy feeling when standing. Let's stop flomax.  Monitor dizziness symptoms as well as urinary symptoms. If worsening night time awakenings or trouble voiding, let me know for new prostate medicine (likely finasteride).  Continue to ensure good hydration. Consider compression stockings to help with dizziness with standing.   Follow up plan: Return if symptoms worsen or fail to improve.  Ria Bush, MD

## 2019-12-16 NOTE — Telephone Encounter (Signed)
Will see today.  

## 2019-12-16 NOTE — Assessment & Plan Note (Signed)
Stop flomax - may be contributing to orthostatic dizziness.  Monitor effect on urination - if worsening, discussed possibly adding finasteride

## 2019-12-16 NOTE — Assessment & Plan Note (Addendum)
Upcoming [facility] appt

## 2019-12-16 NOTE — Patient Instructions (Addendum)
The flomax may be contributing to the dizzy feeling when standing. Let's stop flomax.  Monitor dizziness symptoms as well as urinary symptoms. If worsening night time awakenings or trouble voiding, let me know for new prostate medicine (likely finasteride).  Continue to ensure good hydration. Consider compression stockings to help with dizziness with standing.   Orthostatic Hypotension Blood pressure is a measurement of how strongly, or weakly, your blood is pressing against the walls of your arteries. Orthostatic hypotension is a sudden drop in blood pressure that happens when you quickly change positions, such as when you get up from sitting or lying down. Arteries are blood vessels that carry blood from your heart throughout your body. When blood pressure is too low, you may not get enough blood to your brain or to the rest of your organs. This can cause weakness, light-headedness, rapid heartbeat, and fainting. This can last for just a few seconds or for up to a few minutes. Orthostatic hypotension is usually not a serious problem. However, if it happens frequently or gets worse, it may be a sign of something more serious. What are the causes? This condition may be caused by:  Sudden changes in posture, such as standing up quickly after you have been sitting or lying down.  Blood loss.  Loss of body fluids (dehydration).  Heart problems.  Hormone (endocrine) problems.  Pregnancy.  Severe infection.  Lack of certain nutrients.  Severe allergic reactions (anaphylaxis).  Certain medicines, such as blood pressure medicine or medicines that make the body lose excess fluids (diuretics). Sometimes, this condition can be caused by not taking medicine as directed, such as taking too much of a certain medicine. What increases the risk? The following factors may make you more likely to develop this condition:  Age. Risk increases as you get older.  Conditions that affect the heart or the  central nervous system.  Taking certain medicines, such as blood pressure medicine or diuretics.  Being pregnant. What are the signs or symptoms? Symptoms of this condition may include:  Weakness.  Light-headedness.  Dizziness.  Blurred vision.  Fatigue.  Rapid heartbeat.  Fainting, in severe cases. How is this diagnosed? This condition is diagnosed based on:  Your medical history.  Your symptoms.  Your blood pressure measurement. Your health care provider will check your blood pressure when you are: ? Lying down. ? Sitting. ? Standing. A blood pressure reading is recorded as two numbers, such as "120 over 80" (or 120/80). The first ("top") number is called the systolic pressure. It is a measure of the pressure in your arteries as your heart beats. The second ("bottom") number is called the diastolic pressure. It is a measure of the pressure in your arteries when your heart relaxes between beats. Blood pressure is measured in a unit called mm Hg. Healthy blood pressure for most adults is 120/80. If your blood pressure is below 90/60, you may be diagnosed with hypotension. Other information or tests that may be used to diagnose orthostatic hypotension include:  Your other vital signs, such as your heart rate and temperature.  Blood tests.  Tilt table test. For this test, you will be safely secured to a table that moves you from a lying position to an upright position. Your heart rhythm and blood pressure will be monitored during the test. How is this treated? This condition may be treated by:  Changing your diet. This may involve eating more salt (sodium) or drinking more water.  Taking medicines to  raise your blood pressure.  Changing the dosage of certain medicines you are taking that might be lowering your blood pressure.  Wearing compression stockings. These stockings help to prevent blood clots and reduce swelling in your legs. In some cases, you may need to go  to the hospital for:  Fluid replacement. This means you will receive fluids through an IV.  Blood replacement. This means you will receive donated blood through an IV (transfusion).  Treating an infection or heart problems, if this applies.  Monitoring. You may need to be monitored while medicines that you are taking wear off. Follow these instructions at home: Eating and drinking   Drink enough fluid to keep your urine pale yellow.  Eat a healthy diet, and follow instructions from your health care provider about eating or drinking restrictions. A healthy diet includes: ? Fresh fruits and vegetables. ? Whole grains. ? Lean meats. ? Low-fat dairy products.  Eat extra salt only as directed. Do not add extra salt to your diet unless your health care provider told you to do that.  Eat frequent, small meals.  Avoid standing up suddenly after eating. Medicines  Take over-the-counter and prescription medicines only as told by your health care provider. ? Follow instructions from your health care provider about changing the dosage of your current medicines, if this applies. ? Do not stop or adjust any of your medicines on your own. General instructions   Wear compression stockings as told by your health care provider.  Get up slowly from lying down or sitting positions. This gives your blood pressure a chance to adjust.  Avoid hot showers and excessive heat as directed by your health care provider.  Return to your normal activities as told by your health care provider. Ask your health care provider what activities are safe for you.  Do not use any products that contain nicotine or tobacco, such as cigarettes, e-cigarettes, and chewing tobacco. If you need help quitting, ask your health care provider.  Keep all follow-up visits as told by your health care provider. This is important. Contact a health care provider if you:  Vomit.  Have diarrhea.  Have a fever for more than  2-3 days.  Feel more thirsty than usual.  Feel weak and tired. Get help right away if you:  Have chest pain.  Have a fast or irregular heartbeat.  Develop numbness in any part of your body.  Cannot move your arms or your legs.  Have trouble speaking.  Become sweaty or feel light-headed.  Faint.  Feel short of breath.  Have trouble staying awake.  Feel confused. Summary  Orthostatic hypotension is a sudden drop in blood pressure that happens when you quickly change positions.  Orthostatic hypotension is usually not a serious problem.  It is diagnosed by having your blood pressure taken lying down, sitting, and then standing.  It may be treated by changing your diet or adjusting your medicines. This information is not intended to replace advice given to you by your health care provider. Make sure you discuss any questions you have with your health care provider. Document Revised: 11/19/2017 Document Reviewed: 11/19/2017 Elsevier Patient Education  [employer].

## 2019-12-22 ENCOUNTER — Encounter (INDEPENDENT_AMBULATORY_CARE_PROVIDER_SITE_OTHER): Payer: PPO

## 2019-12-22 ENCOUNTER — Ambulatory Visit (INDEPENDENT_AMBULATORY_CARE_PROVIDER_SITE_OTHER): Payer: PPO | Admitting: Vascular Surgery

## 2019-12-26 DIAGNOSIS — C44729 Squamous cell carcinoma of skin of left lower limb, including hip: Secondary | ICD-10-CM | POA: Diagnosis not present

## 2019-12-27 DIAGNOSIS — M9905 Segmental and somatic dysfunction of pelvic region: Secondary | ICD-10-CM | POA: Diagnosis not present

## 2019-12-27 DIAGNOSIS — M955 Acquired deformity of pelvis: Secondary | ICD-10-CM | POA: Diagnosis not present

## 2019-12-27 DIAGNOSIS — M5033 Other cervical disc degeneration, cervicothoracic region: Secondary | ICD-10-CM | POA: Diagnosis not present

## 2019-12-27 DIAGNOSIS — M9901 Segmental and somatic dysfunction of cervical region: Secondary | ICD-10-CM | POA: Diagnosis not present

## 2019-12-29 DIAGNOSIS — M9901 Segmental and somatic dysfunction of cervical region: Secondary | ICD-10-CM | POA: Diagnosis not present

## 2019-12-29 DIAGNOSIS — M955 Acquired deformity of pelvis: Secondary | ICD-10-CM | POA: Diagnosis not present

## 2019-12-29 DIAGNOSIS — M9905 Segmental and somatic dysfunction of pelvic region: Secondary | ICD-10-CM | POA: Diagnosis not present

## 2019-12-29 DIAGNOSIS — M5033 Other cervical disc degeneration, cervicothoracic region: Secondary | ICD-10-CM | POA: Diagnosis not present

## 2019-12-30 ENCOUNTER — Other Ambulatory Visit (INDEPENDENT_AMBULATORY_CARE_PROVIDER_SITE_OTHER): Payer: Self-pay | Admitting: Vascular Surgery

## 2019-12-30 DIAGNOSIS — M9901 Segmental and somatic dysfunction of cervical region: Secondary | ICD-10-CM | POA: Diagnosis not present

## 2019-12-30 DIAGNOSIS — M955 Acquired deformity of pelvis: Secondary | ICD-10-CM | POA: Diagnosis not present

## 2019-12-30 DIAGNOSIS — M9905 Segmental and somatic dysfunction of pelvic region: Secondary | ICD-10-CM | POA: Diagnosis not present

## 2019-12-30 DIAGNOSIS — M5033 Other cervical disc degeneration, cervicothoracic region: Secondary | ICD-10-CM | POA: Diagnosis not present

## 2020-01-03 ENCOUNTER — Other Ambulatory Visit: Payer: Self-pay | Admitting: Family Medicine

## 2020-01-03 ENCOUNTER — Other Ambulatory Visit (INDEPENDENT_AMBULATORY_CARE_PROVIDER_SITE_OTHER): Payer: Self-pay | Admitting: Vascular Surgery

## 2020-01-03 NOTE — Telephone Encounter (Signed)
Mr. Laski notified by telephone that refills have been sent to his pharmacy.

## 2020-01-03 NOTE — Telephone Encounter (Signed)
Pt left v/m requesting cb about status of pantoprazole refill.

## 2020-01-04 MED ORDER — PANTOPRAZOLE SODIUM 40 MG PO TBEC: 40.0000 mg | DELAYED_RELEASE_TABLET | Freq: Every day | ORAL | 3 refills | Status: DC

## 2020-01-04 NOTE — Telephone Encounter (Signed)
Pt left v/m that [facility] [address] advised pt that pantoprazole had been denied. I spoke with Marya Amsler at [address] and he said did not receive the electronic refill; I gave verbal order for pantoprazole 40 mg tab # 90 x 3 taking 1 tablet po daily. Marya Amsler voiced understanding and med ready for pick up today after 12 noon. Pt notified and voiced understanding.

## 2020-01-04 NOTE — Addendum Note (Signed)
Addended by: Helene Shoe on: 01/04/2020 09:55 AM   Modules accepted: Orders

## 2020-01-09 ENCOUNTER — Other Ambulatory Visit: Payer: Self-pay

## 2020-01-09 DIAGNOSIS — M955 Acquired deformity of pelvis: Secondary | ICD-10-CM | POA: Diagnosis not present

## 2020-01-09 DIAGNOSIS — M9901 Segmental and somatic dysfunction of cervical region: Secondary | ICD-10-CM | POA: Diagnosis not present

## 2020-01-09 DIAGNOSIS — M9905 Segmental and somatic dysfunction of pelvic region: Secondary | ICD-10-CM | POA: Diagnosis not present

## 2020-01-09 DIAGNOSIS — M5033 Other cervical disc degeneration, cervicothoracic region: Secondary | ICD-10-CM | POA: Diagnosis not present

## 2020-01-09 MED ORDER — TRAMADOL HCL 100 MG PO TABS: 100.0000 mg | ORAL_TABLET | Freq: Every day | ORAL | 0 refills | Status: DC | PRN

## 2020-01-09 NOTE — Telephone Encounter (Signed)
Anderson Malta with [facility] left v/m requesting refill on tamsulosin; pt no longer using [facility] [address].Please advise.

## 2020-01-09 NOTE — Telephone Encounter (Signed)
Anderson Malta with [facility] left v/m that pt advised Anderson Malta that he no longer takes the tamsulosin so does not need refill of tamsulosin but does need refill of tramadol.pt is changing pharmacies.  Name of Medication:tramadol 100 mg  Name of Pharmacy:[facility] Last [place] or Written Date and Quantity: # 30 on 12/13/19 Last Office Visit and Type:12/16/19 acute  Next Office Visit and Type: 03/22/20 CPX Last Controlled Substance Agreement Date: none noted Last STM:HDQQ noted

## 2020-01-09 NOTE — Telephone Encounter (Signed)
ERx 

## 2020-01-10 ENCOUNTER — Other Ambulatory Visit: Payer: Self-pay

## 2020-01-10 ENCOUNTER — Telehealth: Payer: Self-pay | Admitting: Cardiovascular Disease

## 2020-01-10 ENCOUNTER — Telehealth: Payer: Self-pay

## 2020-01-10 MED ORDER — SIMVASTATIN 40 MG PO TABS: 40.0000 mg | ORAL_TABLET | Freq: Every day | ORAL | 0 refills | Status: DC

## 2020-01-10 MED ORDER — TRAMADOL HCL 50 MG PO TABS: 100.0000 mg | ORAL_TABLET | Freq: Every day | ORAL | 0 refills | Status: DC | PRN

## 2020-01-10 NOTE — Telephone Encounter (Signed)
Noted agree

## 2020-01-10 NOTE — Telephone Encounter (Signed)
simvastatin (ZOCOR) 40 MG tablet 90 tablet 0 01/10/2020    Sig - Route: Take 1 tablet (40 mg total) by mouth at bedtime. - Oral   Sent to pharmacy as: simvastatin (ZOCOR) 40 MG tablet   E-Prescribing Status: Receipt confirmed by pharmacy (01/10/2020  2:44 PM EDT)   [place], [place]

## 2020-01-10 NOTE — Telephone Encounter (Signed)
Carollee Herter called from [facility] requesting change from 100 mg Tramadol to taking 2-50 mg #60 with no refills as they do not have 100 mg available... VO given ok for change from 100 mg tab QD PRN to 2-50 mg tabs QD PRN  I have also changed the historical med on med list

## 2020-01-10 NOTE — Telephone Encounter (Signed)
*  STAT* If patient is at the pharmacy, call can be transferred to refill team.   1. Which medications need to be refilled? (please list name of each medication and dose if known)   Simvastatin 40 q hs   2. Which pharmacy/location (including street and city if local pharmacy) is medication to be sent to?  [facility]   3. Do they need a 30 day or 90 day supply? [place]

## 2020-01-11 ENCOUNTER — Other Ambulatory Visit: Payer: Self-pay

## 2020-01-11 ENCOUNTER — Encounter (INDEPENDENT_AMBULATORY_CARE_PROVIDER_SITE_OTHER): Payer: Self-pay | Admitting: Nurse Practitioner

## 2020-01-11 ENCOUNTER — Ambulatory Visit (INDEPENDENT_AMBULATORY_CARE_PROVIDER_SITE_OTHER): Payer: PPO | Admitting: Nurse Practitioner

## 2020-01-11 ENCOUNTER — Ambulatory Visit (INDEPENDENT_AMBULATORY_CARE_PROVIDER_SITE_OTHER): Payer: PPO

## 2020-01-11 VITALS — BP 124/64 | HR 65 | Resp 16 | Ht 68.0 in | Wt 206.0 lb

## 2020-01-11 DIAGNOSIS — I10 Essential (primary) hypertension: Secondary | ICD-10-CM | POA: Diagnosis not present

## 2020-01-11 DIAGNOSIS — I6523 Occlusion and stenosis of bilateral carotid arteries: Secondary | ICD-10-CM

## 2020-01-11 DIAGNOSIS — I70213 Atherosclerosis of native arteries of extremities with intermittent claudication, bilateral legs: Secondary | ICD-10-CM

## 2020-01-11 DIAGNOSIS — I771 Stricture of artery: Secondary | ICD-10-CM

## 2020-01-11 NOTE — Progress Notes (Signed)
Subjective:    Patient ID: Randall Ali., male    DOB: 06-Dec-1940, 79 y.o.   MRN: [id number] Chief Complaint  Patient presents with  . Follow-up    ultrasound    The patient is seen for follow up evaluation of carotid stenosis. The carotid stenosis followed by ultrasound.  The patient also has right subclavian stenosis as well.  The patient notes that he has been playing up to 6 softball games in a week he denies any issues with upper extremity claudication, weakness or pain.  The patient is also recently had lower extremity intervention and denies any lower extremity claudication either.  The patient denies amaurosis fugax. There is no recent history of TIA symptoms or focal motor deficits. There is no prior documented CVA.  The patient is taking enteric-coated aspirin 81 mg daily.  There is no history of migraine headaches. There is no history of seizures.  The patient has a history of coronary artery disease, no recent episodes of angina or shortness of breath.  There is a history of hyperlipidemia which is being treated with a statin.    Carotid Duplex done today shows 1 to 39% stenosis in the bilateral internal carotid arteries.  The patient also has evidence of right subclavian stenosis however the waveforms are multiphasic with normal flow hemodynamics.  The bilateral vertebral arteries also have antegrade flow.   Review of Systems     Objective:   Physical Exam  BP 124/64 (BP Location: Left Arm)   Pulse 65   Resp 16   Ht 5\' 8"  (1.727 m)   Wt 206 lb (93.4 kg)   BMI 31.32 kg/m   Past Medical History:  Diagnosis Date  . Allergy    seasonal  . Arthritis    all over- in general   . CAD (coronary artery disease)    a. inferior wall MI 10/01 s/p PCI/DES to RCA; b. Myoview 3/16 neg for ischemia; c. LHC 8/16: ostLAD 80%, OM1 70%, OM2 70% x 2 lesions, mRCA 30%, dRCA 70% s/p 4-V CABG 01/24/15 (LIMA-LAD, VG- OM1, VG-OM2, VG-PDA)   . Cancer (HCC)    skin, melanoma  .  Carotid artery disease ([place])    a. [place] 8/16: 1-39% bilateral ICA stenosis  . Cataract    removed  . Diastolic dysfunction    a. TTE 8/16: EF 55-60%, no RWMA, Gr1DD, calcified mitral annulus, mild biatrial enlargement  . Erectile dysfunction   . GERD (gastroesophageal reflux disease)   . History of elbow surgery   . History of hiatal hernia   . HLD (hyperlipidemia)   . HTN (hypertension)   . Inferior myocardial infarction ([place]) 10/01   stent RCA  . Postoperative wound infection 02/02/2015  . Reflux esophagitis   . Sleep apnea 2017   CPAP at night    Social History   Socioeconomic History  . Marital status: Single    Spouse name: Not on file  . Number of children: Not on file  . Years of education: 59  . Highest education level: Not on file  Occupational History  . Occupation: retired    Comment: Agricultural consultant  Tobacco Use  . Smoking status: Former Smoker    Packs/day: 2.50    Years: 40.00    Pack years: 100.00    Types: Cigarettes    Quit date: 03/24/2000    Years since quitting: 19.8  . Smokeless tobacco: Never Used  Vaping Use  . Vaping Use: Never used  Substance and Sexual Activity  . Alcohol use: Yes    Alcohol/week: 5.0 standard drinks    Types: 5 Shots of liquor per week    Comment: occ  . Drug use: No  . Sexual activity: Yes  Other Topics Concern  . Not on file  Social History Narrative   Singled; lives with son and dog    Occ: retired, back part time at [employer];    Activity: gym 4-5d/wk   Diet: good water, fruits/vegetables daily   Caffeine Use-yes   Social Determinants of Health   Financial Resource Strain: Low Risk   . Difficulty of Paying Living Expenses: Not hard at all  Food Insecurity: No Food Insecurity  . Worried About Charity fundraiser in the Last Year: Never true  . Ran Out of Food in the Last Year: Never true  Transportation Needs: No Transportation Needs  . Lack of Transportation (Medical): No  . Lack of Transportation (Non-Medical):  No  Physical Activity: Sufficiently Active  . Days of Exercise per Week: 5 days  . Minutes of Exercise per Session: 60 min  Stress: No Stress Concern Present  . Feeling of Stress : Not at all  Social Connections:   . Frequency of Communication with Friends and Family:   . Frequency of Social Gatherings with Friends and Family:   . Attends Religious Services:   . Active Member of Clubs or Organizations:   . Attends Archivist Meetings:   [name] Marital Status:   Intimate Partner Violence: Not At Risk  . Fear of Current or Ex-Partner: No  . Emotionally Abused: No  . Physically Abused: No  . Sexually Abused: No    Past Surgical History:  Procedure Laterality Date  . arm surgery  2010  . BROW LIFT Bilateral 11/25/2019   Procedure: BROW PTOSIS REPAIR BILATERAL;  Surgeon: Karle Starch, MD;  Location: [place];  Service: Ophthalmology;  Laterality: Bilateral;  sleep apnea  . CARDIAC CATHETERIZATION  06/24/11  . CARDIAC CATHETERIZATION N/A 01/18/2015   Procedure: Left Heart Cath with coronary angiography;  Surgeon: Minna Merritts, MD;  Location: [place] [facility];  Service: Cardiovascular;  Laterality: N/A;  . CARDIAC CATHETERIZATION N/A 01/18/2015   Procedure: Intravascular Pressure Wire/FFR Study;  Surgeon: [name], MD;  Location: [place] [facility];  Service: Cardiovascular;  Laterality: N/A;  . CAROTID STENT  03/10/2011  . COLONOSCOPY  2010  . COLONOSCOPY  06/14/2014   Dr Hilarie Fredrickson  . CORONARY ARTERY BYPASS GRAFT N/A 01/24/2015   Procedure: CORONARY ARTERY BYPASS GRAFTING x 4 (LIMA-LAD, SVG-Int 1- Int 2, SVG-PD) ENDOSCOPIC GREATER SAPHENOUS VEIN HARVEST LEFT LEG;  Surgeon: [name], MD;  Location: [place];  Service: Open Heart Surgery;  Laterality: N/A;  . EMBOLECTOMY  06/15/2019   Procedure: EMBOLECTOMY;  Surgeon: Katha Cabal, MD;  Location: [facility];  Service: Vascular;;  right superficial femoral artery  . ENDARTERECTOMY FEMORAL Right 06/15/2019    Procedure: ENDARTERECTOMY FEMORAL;  Surgeon: Katha Cabal, MD;  Location: [facility];  Service: Vascular;  Laterality: Right;  common femoral profunda femoris superficial femoral  . ESOPHAGOGASTRODUODENOSCOPY (EGD) WITH PROPOFOL N/A 04/24/2016   Procedure: ESOPHAGOGASTRODUODENOSCOPY (EGD) WITH PROPOFOL;  Surgeon: Jerene Bears, MD;  Location: [facility];  Service: Gastroenterology;  Laterality: N/A;  . EYE SURGERY     lasik 15 yrs. ago, cataracts removed - both eyes   . HAMMER TOE SURGERY     right toe  .  INSERTION OF ILIAC STENT Right 06/15/2019   Procedure: INSERTION OF ILIAC STENT ( STENTING OF SFA/POP ARTERY );  Surgeon: Katha Cabal, MD;  Location: [facility];  Service: Vascular;  Laterality: Right;  angioplpasty and stent placement: right superficial femoral right tibiopopliteal trunk bilateral common iliac arteries  . LEFT HEART CATH AND CORONARY ANGIOGRAPHY Left 06/10/2017   Procedure: LEFT HEART CATH AND CORONARY ANGIOGRAPHY;  Surgeon: Minna Merritts, MD;  Location: [place] [facility];  Service: Cardiovascular;  Laterality: Left;  . LOWER EXTREMITY ANGIOGRAPHY Left 01/04/2019   Procedure: LOWER EXTREMITY ANGIOGRAPHY;  Surgeon: Katha Cabal, MD;  Location: [place] [facility];  Service: Cardiovascular;  Laterality: Left;  . LOWER EXTREMITY ANGIOGRAPHY Right 01/25/2019   Procedure: LOWER EXTREMITY ANGIOGRAPHY;  Surgeon: Katha Cabal, MD;  Location: [place] [facility];  Service: Cardiovascular;  Laterality: Right;  . NASAL SINUS SURGERY  2008   septpolasty, bilateral turbinate reduction  . SHOULDER ARTHROSCOPY  2012  . TEE WITHOUT CARDIOVERSION N/A 01/24/2015   Procedure: TRANSESOPHAGEAL ECHOCARDIOGRAM (TEE);  Surgeon: [name], MD;  Location: [place];  Service: Open Heart Surgery;  Laterality: N/A;  . [place]  . UPPER GI ENDOSCOPY  07/2014, 04-24-16   Dr Raquel James  . WRIST SURGERY  2011    Family History  Problem Relation Age of Onset  .  Hypertension Mother   . Heart disease Mother   . Hypertension Father   . Diabetes Father   . Heart disease Brother 23  . Cancer Paternal Grandfather   . Colon cancer Neg Hx   . Prostate cancer Neg Hx   . Bladder Cancer Neg Hx   . Kidney cancer Neg Hx     No Known Allergies     Assessment & Plan:   1. Subclavian artery stenosis, right (HCC) Denies any arm pain or weakness.  Again, patient currently plays softball multiple times per week without any issues.  We will continue to monitor this on an annual basis with his carotid duplex or sooner if issues should arise.  2. Carotid artery stenosis, asymptomatic, bilateral Recommend:  Given the patient's asymptomatic subcritical stenosis no further invasive testing or surgery at this time.  Duplex ultrasound shows 1-39% stenosis bilaterally.  Continue antiplatelet therapy as prescribed Continue management of CAD, HTN and Hyperlipidemia Healthy heart diet,  encouraged exercise at least 4 times per week Follow up in 12 months with duplex ultrasound and physical exam   3. Essential hypertension Continue antihypertensive medications as already ordered, these medications have been reviewed and there are no changes at this time.   4. Atherosclerosis of native artery of both lower extremities with intermittent claudication ([place]) Patient has been doing well exercising multiple times per week without difficulty.  Patient has upcoming appointment where we will review noninvasive studies.  Patient advised to continue with Plavix and aspirin, at which point we will discuss possible cessation of Plavix.   Current Outpatient Medications on File Prior to Visit  Medication Sig Dispense Refill  . acetaminophen (TYLENOL) 500 MG tablet Take 1,000 mg by mouth at bedtime.    . Ascorbic Acid (VITAMIN C) 1000 MG tablet Take 1,000 mg by mouth daily.    [name] aspirin 81 MG EC tablet Take 81 mg by mouth daily.      . Calcium-Magnesium-Vitamin D (CALCIUM  1200+D3 PO) Take 1 tablet by mouth daily.    . Cholecalciferol (VITAMIN D3) 50 MCG (2000 UT) TABS Take 2,000 Units by  mouth daily.    . clopidogrel (PLAVIX) 75 MG tablet TAKE 1 TABLET BY MOUTH EVERY DAY 90 tablet 3  . ibuprofen (ADVIL,MOTRIN) 200 MG tablet Take 400 mg by mouth every 6 (six) hours as needed for headache or moderate pain.     [name] ketoconazole (NIZORAL) 2 % shampoo SHAMPOO AFFECTED AREA ONCE DAILY FOR ONE WEEK THEN REPEAT ONCE A WEEK AS MAINTENANCE. LATHER AND LET SIT A FEW MINS BEFORE RINSING    . lisinopril (ZESTRIL) 5 MG tablet Take 1 tablet (5 mg total) by mouth daily. (Patient taking differently: Take 5 mg by mouth every morning. ) 90 tablet 3  . metoprolol succinate (TOPROL-XL) 25 MG 24 hr tablet Take 0.5 tablets (12.5 mg total) by mouth daily. (Patient taking differently: Take 12.5 mg by mouth at bedtime. ) 45 tablet 3  . nitroGLYCERIN (NITROSTAT) 0.4 MG SL tablet Place 1 tablet (0.4 mg total) under the tongue every 5 (five) minutes as needed for chest pain. 25 tablet 3  . NONFORMULARY OR COMPOUNDED ITEM Trimix (30/1/10)-(Pap/Phent/PGE)  Test Dose 3 58ml vials  Qty #3 Refills 0  [facility] [phone] Fax [phone] 3 each 0  . pantoprazole (PROTONIX) 40 MG tablet Take 1 tablet (40 mg total) by mouth daily. 90 tablet 3  . Polyethyl Glycol-Propyl Glycol (LUBRICANT EYE DROPS) 0.4-0.3 % SOLN Place 1-2 drops into both eyes 3 (three) times daily as needed (burning eyes.).    [name] simvastatin (ZOCOR) 40 MG tablet Take 1 tablet (40 mg total) by mouth at bedtime. 90 tablet 0  . traMADol (ULTRAM) 50 MG tablet Take 2 tablets (100 mg total) by mouth daily as needed. 60 tablet 0  . vitamin B-12 (CYANOCOBALAMIN) 500 MCG tablet Take 500 mcg by mouth daily.    . vitamin E 1000 UNIT capsule Take 1,000 Units by mouth daily. Vitamin E 450 mg    . Zinc 50 MG TABS Take 50 mg by mouth daily.     No current facility-administered medications on file prior to visit.    There are no  Patient Instructions on file for this visit. No follow-ups on file.   Kris Hartmann, NP

## 2020-01-16 DIAGNOSIS — M9901 Segmental and somatic dysfunction of cervical region: Secondary | ICD-10-CM | POA: Diagnosis not present

## 2020-01-16 DIAGNOSIS — M9905 Segmental and somatic dysfunction of pelvic region: Secondary | ICD-10-CM | POA: Diagnosis not present

## 2020-01-16 DIAGNOSIS — M955 Acquired deformity of pelvis: Secondary | ICD-10-CM | POA: Diagnosis not present

## 2020-01-16 DIAGNOSIS — M5033 Other cervical disc degeneration, cervicothoracic region: Secondary | ICD-10-CM | POA: Diagnosis not present

## 2020-01-24 DIAGNOSIS — M955 Acquired deformity of pelvis: Secondary | ICD-10-CM | POA: Diagnosis not present

## 2020-01-24 DIAGNOSIS — M9905 Segmental and somatic dysfunction of pelvic region: Secondary | ICD-10-CM | POA: Diagnosis not present

## 2020-01-24 DIAGNOSIS — M9901 Segmental and somatic dysfunction of cervical region: Secondary | ICD-10-CM | POA: Diagnosis not present

## 2020-01-24 DIAGNOSIS — M5033 Other cervical disc degeneration, cervicothoracic region: Secondary | ICD-10-CM | POA: Diagnosis not present

## 2020-02-02 DIAGNOSIS — M955 Acquired deformity of pelvis: Secondary | ICD-10-CM | POA: Diagnosis not present

## 2020-02-02 DIAGNOSIS — M9901 Segmental and somatic dysfunction of cervical region: Secondary | ICD-10-CM | POA: Diagnosis not present

## 2020-02-02 DIAGNOSIS — M5033 Other cervical disc degeneration, cervicothoracic region: Secondary | ICD-10-CM | POA: Diagnosis not present

## 2020-02-02 DIAGNOSIS — M9905 Segmental and somatic dysfunction of pelvic region: Secondary | ICD-10-CM | POA: Diagnosis not present

## 2020-02-06 DIAGNOSIS — M9905 Segmental and somatic dysfunction of pelvic region: Secondary | ICD-10-CM | POA: Diagnosis not present

## 2020-02-06 DIAGNOSIS — M9901 Segmental and somatic dysfunction of cervical region: Secondary | ICD-10-CM | POA: Diagnosis not present

## 2020-02-06 DIAGNOSIS — M5033 Other cervical disc degeneration, cervicothoracic region: Secondary | ICD-10-CM | POA: Diagnosis not present

## 2020-02-06 DIAGNOSIS — M955 Acquired deformity of pelvis: Secondary | ICD-10-CM | POA: Diagnosis not present

## 2020-02-08 ENCOUNTER — Telehealth: Payer: Self-pay

## 2020-02-08 ENCOUNTER — Other Ambulatory Visit: Payer: Self-pay | Admitting: Family Medicine

## 2020-02-08 NOTE — Telephone Encounter (Signed)
Name of Medication:  Tramadol Name of Pharmacy: [facility] or Written Date and Quantity: 01/11/20, #60 Last Office Visit and Type: 12/16/19, dizziness Next Office Visit and Type: 03/22/20, AWV Last Controlled Substance Agreement Date: none Last UDS: 01/18/16

## 2020-02-08 NOTE — Telephone Encounter (Signed)
ERx 

## 2020-02-08 NOTE — Telephone Encounter (Signed)
Called patient and got his awv scheduled and cpe rescheduled.

## 2020-02-08 NOTE — Telephone Encounter (Signed)
Pt has cpe (03/22/20) and labs (03/15/20) scheduled.  Plz schedule wellness with Calandra before 03/22/20 cpe.

## 2020-02-09 ENCOUNTER — Other Ambulatory Visit: Payer: Self-pay

## 2020-02-09 ENCOUNTER — Encounter (INDEPENDENT_AMBULATORY_CARE_PROVIDER_SITE_OTHER): Payer: Self-pay | Admitting: Vascular Surgery

## 2020-02-09 ENCOUNTER — Ambulatory Visit (INDEPENDENT_AMBULATORY_CARE_PROVIDER_SITE_OTHER): Payer: PPO | Admitting: Vascular Surgery

## 2020-02-09 ENCOUNTER — Ambulatory Visit (INDEPENDENT_AMBULATORY_CARE_PROVIDER_SITE_OTHER): Payer: PPO

## 2020-02-09 VITALS — BP 111/64 | HR 64 | Ht 68.0 in | Wt 203.0 lb

## 2020-02-09 DIAGNOSIS — I10 Essential (primary) hypertension: Secondary | ICD-10-CM

## 2020-02-09 DIAGNOSIS — Z951 Presence of aortocoronary bypass graft: Secondary | ICD-10-CM

## 2020-02-09 DIAGNOSIS — I9789 Other postprocedural complications and disorders of the circulatory system, not elsewhere classified: Secondary | ICD-10-CM

## 2020-02-09 DIAGNOSIS — I4891 Unspecified atrial fibrillation: Secondary | ICD-10-CM | POA: Diagnosis not present

## 2020-02-09 DIAGNOSIS — I70213 Atherosclerosis of native arteries of extremities with intermittent claudication, bilateral legs: Secondary | ICD-10-CM

## 2020-02-09 DIAGNOSIS — E782 Mixed hyperlipidemia: Secondary | ICD-10-CM

## 2020-02-09 NOTE — Telephone Encounter (Signed)
Noted  

## 2020-02-10 ENCOUNTER — Ambulatory Visit: Payer: Self-pay | Admitting: Urology

## 2020-02-10 DIAGNOSIS — M5033 Other cervical disc degeneration, cervicothoracic region: Secondary | ICD-10-CM | POA: Diagnosis not present

## 2020-02-10 DIAGNOSIS — M9901 Segmental and somatic dysfunction of cervical region: Secondary | ICD-10-CM | POA: Diagnosis not present

## 2020-02-10 DIAGNOSIS — M955 Acquired deformity of pelvis: Secondary | ICD-10-CM | POA: Diagnosis not present

## 2020-02-10 DIAGNOSIS — M9905 Segmental and somatic dysfunction of pelvic region: Secondary | ICD-10-CM | POA: Diagnosis not present

## 2020-02-15 ENCOUNTER — Encounter (INDEPENDENT_AMBULATORY_CARE_PROVIDER_SITE_OTHER): Payer: Self-pay | Admitting: Vascular Surgery

## 2020-02-15 NOTE — Progress Notes (Signed)
MRN : [id number]  Randall Ali. is a 79 y.o. (08/08/1940) male who presents with chief complaint of  Chief Complaint  Patient presents with  . Follow-up    6 mo U/s   .  History of Present Illness:   The patient returns to the office for followup and review of the noninvasive studies. There have been no interval changes in lower extremity symptoms. No interval shortening of the patient's claudication distance or development of rest pain symptoms. No new ulcers or wounds have occurred since the last visit.  There have been no significant changes to the patient's overall health care.  The patient denies amaurosis fugax or recent TIA symptoms. There are no recent neurological changes noted. The patient denies history of DVT, PE or superficial thrombophlebitis. The patient denies recent episodes of angina or shortness of breath.   ABI Rt=1.01 and Lt=1.00  (previous ABI's Rt=1.16 and Lt=1.12)   Current Meds  Medication Sig  . acetaminophen (TYLENOL) 500 MG tablet Take 1,000 mg by mouth at bedtime.  . Ascorbic Acid (VITAMIN C) 1000 MG tablet Take 1,000 mg by mouth daily.  [name] aspirin 81 MG EC tablet Take 81 mg by mouth daily.    . Calcium-Magnesium-Vitamin D (CALCIUM 1200+D3 PO) Take 1 tablet by mouth daily.  . Cholecalciferol (VITAMIN D3) 50 MCG (2000 UT) TABS Take 2,000 Units by mouth daily.  . clopidogrel (PLAVIX) 75 MG tablet TAKE 1 TABLET BY MOUTH EVERY DAY  . ibuprofen (ADVIL,MOTRIN) 200 MG tablet Take 400 mg by mouth every 6 (six) hours as needed for headache or moderate pain.   [name] ketoconazole (NIZORAL) 2 % shampoo SHAMPOO AFFECTED AREA ONCE DAILY FOR ONE WEEK THEN REPEAT ONCE A WEEK AS MAINTENANCE. LATHER AND LET SIT A FEW MINS BEFORE RINSING  . lisinopril (ZESTRIL) 5 MG tablet Take 1 tablet (5 mg total) by mouth daily. (Patient taking differently: Take 5 mg by mouth every morning. )  . metoprolol succinate (TOPROL-XL) 25 MG 24 hr tablet Take 0.5 tablets (12.5 mg total) by  mouth daily. (Patient taking differently: Take 12.5 mg by mouth at bedtime. )  . nitroGLYCERIN (NITROSTAT) 0.4 MG SL tablet Place 1 tablet (0.4 mg total) under the tongue every 5 (five) minutes as needed for chest pain.  . NONFORMULARY OR COMPOUNDED ITEM Trimix (30/1/10)-(Pap/Phent/PGE)  Test Dose 3 47ml vials  Qty #3 [place] [phone] Fax [phone]  . pantoprazole (PROTONIX) 40 MG tablet Take 1 tablet (40 mg total) by mouth daily.  Vladimir Faster Glycol-Propyl Glycol (LUBRICANT EYE DROPS) 0.4-0.3 % SOLN Place 1-2 drops into both eyes 3 (three) times daily as needed (burning eyes.).  [name] simvastatin (ZOCOR) 40 MG tablet Take 1 tablet (40 mg total) by mouth at bedtime.  . traMADol (ULTRAM) 50 MG tablet TAKE 2 TABLETS BY MOUTH DAILY AS NEEDED  . vitamin B-12 (CYANOCOBALAMIN) 500 MCG tablet Take 500 mcg by mouth daily.  . vitamin E 1000 UNIT capsule Take 1,000 Units by mouth daily. Vitamin E 450 mg  . Zinc 50 MG TABS Take 50 mg by mouth daily.    Past Medical History:  Diagnosis Date  . Allergy    seasonal  . Arthritis    all over- in general   . CAD (coronary artery disease)    a. inferior wall MI 10/01 s/p PCI/DES to RCA; b. Myoview 3/16 neg for ischemia; c. LHC 8/16: ostLAD 80%, OM1 70%, OM2 70% x 2 lesions, mRCA 30%,  dRCA 70% s/p 4-V CABG 01/24/15 (LIMA-LAD, VG- OM1, VG-OM2, VG-PDA)   . Cancer (HCC)    skin, melanoma  . Carotid artery disease ([place])    a. [place] 8/16: 1-39% bilateral ICA stenosis  . Cataract    removed  . Diastolic dysfunction    a. TTE 8/16: EF 55-60%, no RWMA, Gr1DD, calcified mitral annulus, mild biatrial enlargement  . Erectile dysfunction   . GERD (gastroesophageal reflux disease)   . History of elbow surgery   . History of hiatal hernia   . HLD (hyperlipidemia)   . HTN (hypertension)   . Inferior myocardial infarction ([place]) 10/01   stent RCA  . Postoperative wound infection 02/02/2015  . Reflux esophagitis   . Sleep apnea 2017    CPAP at night    Past Surgical History:  Procedure Laterality Date  . arm surgery  2010  . BROW LIFT Bilateral 11/25/2019   Procedure: BROW PTOSIS REPAIR BILATERAL;  Surgeon: Karle Starch, MD;  Location: [facility];  Service: Ophthalmology;  Laterality: Bilateral;  sleep apnea  . CARDIAC CATHETERIZATION  06/24/11  . CARDIAC CATHETERIZATION N/A 01/18/2015   Procedure: Left Heart Cath with coronary angiography;  Surgeon: Minna Merritts, MD;  Location: [facility];  Service: Cardiovascular;  Laterality: N/A;  . CARDIAC CATHETERIZATION N/A 01/18/2015   Procedure: Intravascular Pressure Wire/FFR Study;  Surgeon: [name], MD;  Location: [facility];  Service: Cardiovascular;  Laterality: N/A;  . CAROTID STENT  03/10/2011  . COLONOSCOPY  2010  . COLONOSCOPY  06/14/2014   Dr Hilarie Fredrickson  . CORONARY ARTERY BYPASS GRAFT N/A 01/24/2015   Procedure: CORONARY ARTERY BYPASS GRAFTING x 4 (LIMA-LAD, SVG-Int 1- Int 2, SVG-PD) ENDOSCOPIC GREATER SAPHENOUS VEIN HARVEST LEFT LEG;  Surgeon: [name], MD;  Location: [facility];  Service: Open Heart Surgery;  Laterality: N/A;  . EMBOLECTOMY  06/15/2019   Procedure: EMBOLECTOMY;  Surgeon: Katha Cabal, MD;  Location: [facility];  Service: Vascular;;  right superficial femoral artery  . ENDARTERECTOMY FEMORAL Right 06/15/2019   Procedure: ENDARTERECTOMY FEMORAL;  Surgeon: Katha Cabal, MD;  Location: [facility];  Service: Vascular;  Laterality: Right;  common femoral profunda femoris superficial femoral  . ESOPHAGOGASTRODUODENOSCOPY (EGD) WITH PROPOFOL N/A 04/24/2016   Procedure: ESOPHAGOGASTRODUODENOSCOPY (EGD) WITH PROPOFOL;  Surgeon: Jerene Bears, MD;  Location: [facility];  Service: Gastroenterology;  Laterality: N/A;  . EYE SURGERY     lasik 15 yrs. ago, cataracts removed - both eyes   . HAMMER TOE SURGERY     right toe  . INSERTION OF ILIAC STENT Right 06/15/2019   Procedure: INSERTION OF ILIAC STENT ( STENTING OF  SFA/POP ARTERY );  Surgeon: Katha Cabal, MD;  Location: [facility];  Service: Vascular;  Laterality: Right;  angioplpasty and stent placement: right superficial femoral right tibiopopliteal trunk bilateral common iliac arteries  . LEFT HEART CATH AND CORONARY ANGIOGRAPHY Left 06/10/2017   Procedure: LEFT HEART CATH AND CORONARY ANGIOGRAPHY;  Surgeon: Minna Merritts, MD;  Location: [facility];  Service: Cardiovascular;  Laterality: Left;  . LOWER EXTREMITY ANGIOGRAPHY Left 01/04/2019   Procedure: LOWER EXTREMITY ANGIOGRAPHY;  Surgeon: Katha Cabal, MD;  Location: [facility];  Service: Cardiovascular;  Laterality: Left;  . LOWER EXTREMITY ANGIOGRAPHY Right 01/25/2019   Procedure: LOWER EXTREMITY ANGIOGRAPHY;  Surgeon: Katha Cabal, MD;  Location: [facility];  Service: Cardiovascular;  Laterality: Right;  . NASAL SINUS SURGERY  2008   septpolasty, bilateral turbinate reduction  . SHOULDER ARTHROSCOPY  2012  . TEE WITHOUT CARDIOVERSION N/A 01/24/2015   Procedure: TRANSESOPHAGEAL ECHOCARDIOGRAM (TEE);  Surgeon: [name], MD;  Location: [place];  Service: Open Heart Surgery;  Laterality: N/A;  . [place]  . UPPER GI ENDOSCOPY  07/2014, 04-24-16   Dr Raquel James  . WRIST SURGERY  2011    Social History Social History   Tobacco Use  . Smoking status: Former Smoker    Packs/day: 2.50    Years: 40.00    Pack years: 100.00    Types: Cigarettes    Quit date: 03/24/2000    Years since quitting: 19.9  . Smokeless tobacco: Never Used  Vaping Use  . Vaping Use: Never used  Substance Use Topics  . Alcohol use: Yes    Alcohol/week: 5.0 standard drinks    Types: 5 Shots of liquor per week    Comment: occ  . Drug use: No    Family History Family History  Problem Relation Age of Onset  . Hypertension Mother   . Heart disease Mother   . Hypertension Father   . Diabetes Father   . Heart disease Brother 64  . Cancer Paternal  Grandfather   . Colon cancer Neg Hx   . Prostate cancer Neg Hx   . Bladder Cancer Neg Hx   . Kidney cancer Neg Hx     No Known Allergies   REVIEW OF SYSTEMS (Negative unless checked)  Constitutional: [] Weight loss  [] Fever  [] Chills Cardiac: [] Chest pain   [] Chest pressure   [] Palpitations   [] Shortness of breath when laying flat   [] Shortness of breath with exertion. Vascular:  [x] Pain in legs with walking   [] Pain in legs at rest  [] History of DVT   [] Phlebitis   [] Swelling in legs   [] Varicose veins   [] Non-healing ulcers Pulmonary:   [] Uses home oxygen   [] Productive cough   [] Hemoptysis   [] Wheeze  [] COPD   [] Asthma Neurologic:  [] Dizziness   [] Seizures   [] History of stroke   [] History of TIA  [] Aphasia   [] Vissual changes   [] Weakness or numbness in arm   [] Weakness or numbness in leg Musculoskeletal:   [] Joint swelling   [] Joint pain   [] Low back pain Hematologic:  [] Easy bruising  [] Easy bleeding   [] Hypercoagulable state   [] Anemic Gastrointestinal:  [] Diarrhea   [] Vomiting  [] Gastroesophageal reflux/heartburn   [] Difficulty swallowing. Genitourinary:  [] Chronic kidney disease   [] Difficult urination  [] Frequent urination   [] Blood in urine Skin:  [] Rashes   [] Ulcers  Psychological:  [] History of anxiety   []  History of major depression.  Physical Examination  Vitals:   02/09/20 1348  BP: 111/64  Pulse: 64  Weight: 203 lb (92.1 kg)  Height: 5\' 8"  (1.727 m)   Body mass index is 30.87 kg/m. Gen: WD/WN, NAD Head: [place]/AT, No temporalis wasting.  Ear/Nose/Throat: Hearing grossly intact, nares w/o erythema or drainage Eyes: PER, EOMI, sclera nonicteric.  Neck: Supple, no large masses.   Pulmonary:  Good air movement, no audible wheezing bilaterally, no use of accessory muscles.  Cardiac: RRR, no JVD Vascular:  Vessel Right Left  Radial Palpable Palpable  PT Palpable Palpable  DP Palpable Palpable  Gastrointestinal: Non-distended. No guarding/no peritoneal signs.   Musculoskeletal: M/S 5/5 throughout.  No deformity or atrophy.  Neurologic: CN 2-12 intact. Symmetrical.  Speech is fluent. Motor exam as listed above. Psychiatric: Judgment intact, Mood &  affect appropriate for pt's clinical situation. Dermatologic: No rashes or ulcers noted.  No changes consistent with cellulitis.   CBC Lab Results  Component Value Date   WBC 13.2 (H) 06/16/2019   HGB 13.9 06/16/2019   HCT 40.8 06/16/2019   MCV 90.9 06/16/2019   PLT 202 06/16/2019    BMET    Component Value Date/Time   NA 138 06/16/2019 1126   NA 138 01/15/2015 0956   K 4.0 06/16/2019 1126   CL 105 06/16/2019 1126   CO2 24 06/16/2019 1126   GLUCOSE 116 (H) 06/16/2019 1126   BUN 20 06/16/2019 1126   BUN 16 01/15/2015 0956   CREATININE 1.07 06/16/2019 1126   CALCIUM 8.9 06/16/2019 1126   GFRNONAA >60 06/16/2019 1126   GFRAA >60 06/16/2019 1126   CrCl cannot be calculated (Patient's most recent lab result is older than the maximum 21 days allowed.).  COAG Lab Results  Component Value Date   INR 1.1 04/12/2019   INR 0.94 06/04/2017   INR 1.34 01/24/2015    Radiology No results found.    Assessment/Plan 1. Atherosclerosis of native artery of both lower extremities with intermittent claudication (HCC) Recommend:  The patient is status post successful angiogram with intervention.  The patient reports that the claudication symptoms and leg pain is essentially gone.   The patient denies lifestyle limiting changes at this point in time.  No further invasive studies, angiography or surgery at this time The patient should continue walking and begin a more formal exercise program.  The patient should continue antiplatelet therapy and aggressive treatment of the lipid abnormalities  Smoking cessation was again discussed  The patient should continue wearing graduated compression socks 10-15 mmHg strength to control the mild edema.  Patient should undergo noninvasive studies as  ordered. The patient will follow up with me after the studies.   - VAS [place] LOWER EXTREMITY ARTERIAL DUPLEX; Future - VAS [place] ABI WITH/WO TBI; Future  2. Essential hypertension Continue antihypertensive medications as already ordered, these medications have been reviewed and there are no changes at this time.   3. Postoperative atrial fibrillation (HCC) Continue antiarrhythmia medications as already ordered, these medications have been reviewed and there are no changes at this time.  Continue anticoagulation as ordered by Cardiology Service   4. Mixed hyperlipidemia Continue statin as ordered and reviewed, no changes at this time   5. S/P CABG x 4 Continue cardiac and antihypertensive medications as already ordered and reviewed, no changes at this time.  Continue statin as ordered and reviewed, no changes at this time  Nitrates PRN for chest pain    Hortencia Pilar, MD  02/15/2020 1:40 PM

## 2020-02-16 DIAGNOSIS — M955 Acquired deformity of pelvis: Secondary | ICD-10-CM | POA: Diagnosis not present

## 2020-02-16 DIAGNOSIS — M9905 Segmental and somatic dysfunction of pelvic region: Secondary | ICD-10-CM | POA: Diagnosis not present

## 2020-02-16 DIAGNOSIS — M5033 Other cervical disc degeneration, cervicothoracic region: Secondary | ICD-10-CM | POA: Diagnosis not present

## 2020-02-16 DIAGNOSIS — M9901 Segmental and somatic dysfunction of cervical region: Secondary | ICD-10-CM | POA: Diagnosis not present

## 2020-02-28 ENCOUNTER — Other Ambulatory Visit: Payer: Self-pay | Admitting: Family Medicine

## 2020-02-28 DIAGNOSIS — M5033 Other cervical disc degeneration, cervicothoracic region: Secondary | ICD-10-CM | POA: Diagnosis not present

## 2020-02-28 DIAGNOSIS — M955 Acquired deformity of pelvis: Secondary | ICD-10-CM | POA: Diagnosis not present

## 2020-02-28 DIAGNOSIS — M9905 Segmental and somatic dysfunction of pelvic region: Secondary | ICD-10-CM | POA: Diagnosis not present

## 2020-02-28 DIAGNOSIS — M9901 Segmental and somatic dysfunction of cervical region: Secondary | ICD-10-CM | POA: Diagnosis not present

## 2020-02-28 NOTE — Telephone Encounter (Signed)
Last office visit 12/16/2019 for dizziness.  Last refilled 02/08/2020 for #60 with no refills.  CPE scheduled for 03/23/2020.

## 2020-02-29 NOTE — Telephone Encounter (Signed)
ERx 

## 2020-03-12 DIAGNOSIS — M25561 Pain in right knee: Secondary | ICD-10-CM | POA: Diagnosis not present

## 2020-03-15 ENCOUNTER — Other Ambulatory Visit (INDEPENDENT_AMBULATORY_CARE_PROVIDER_SITE_OTHER): Payer: PPO

## 2020-03-15 ENCOUNTER — Other Ambulatory Visit: Payer: Self-pay

## 2020-03-15 ENCOUNTER — Ambulatory Visit: Payer: PPO

## 2020-03-15 ENCOUNTER — Other Ambulatory Visit: Payer: Self-pay | Admitting: Family Medicine

## 2020-03-15 DIAGNOSIS — I9789 Other postprocedural complications and disorders of the circulatory system, not elsewhere classified: Secondary | ICD-10-CM

## 2020-03-15 DIAGNOSIS — E782 Mixed hyperlipidemia: Secondary | ICD-10-CM | POA: Diagnosis not present

## 2020-03-15 DIAGNOSIS — I4891 Unspecified atrial fibrillation: Secondary | ICD-10-CM | POA: Diagnosis not present

## 2020-03-15 LAB — CBC WITH DIFFERENTIAL/PLATELET
Basophils Absolute: 0.1 10*3/uL (ref 0.0–0.1)
Basophils Relative: 0.9 % (ref 0.0–3.0)
Eosinophils Absolute: 0.1 10*3/uL (ref 0.0–0.7)
Eosinophils Relative: 1.5 % (ref 0.0–5.0)
HCT: 43.4 % (ref 39.0–52.0)
Hemoglobin: 14.7 g/dL (ref 13.0–17.0)
Lymphocytes Relative: 21.2 % (ref 12.0–46.0)
Lymphs Abs: 1.5 10*3/uL (ref 0.7–4.0)
MCHC: 33.9 g/dL (ref 30.0–36.0)
MCV: 92.1 fl (ref 78.0–100.0)
Monocytes Absolute: 0.8 10*3/uL (ref 0.1–1.0)
Monocytes Relative: 11.1 % (ref 3.0–12.0)
Neutro Abs: 4.6 10*3/uL (ref 1.4–7.7)
Neutrophils Relative %: 65.3 % (ref 43.0–77.0)
Platelets: 218 10*3/uL (ref 150.0–400.0)
RBC: 4.71 Mil/uL (ref 4.22–5.81)
RDW: 13 % (ref 11.5–15.5)
WBC: 7 10*3/uL (ref 4.0–10.5)

## 2020-03-15 LAB — COMPREHENSIVE METABOLIC PANEL
ALT: 14 U/L (ref 0–53)
AST: 11 U/L (ref 0–37)
Albumin: 4.3 g/dL (ref 3.5–5.2)
Alkaline Phosphatase: 51 U/L (ref 39–117)
BUN: 24 mg/dL — ABNORMAL HIGH (ref 6–23)
CO2: 28 mEq/L (ref 19–32)
Calcium: 9.6 mg/dL (ref 8.4–10.5)
Chloride: 105 mEq/L (ref 96–112)
Creatinine, Ser: 1.14 mg/dL (ref 0.40–1.50)
GFR: 61.24 mL/min (ref >60.00)
Glucose, Bld: 90 mg/dL (ref 70–99)
Potassium: 4.5 mEq/L (ref 3.5–5.1)
Sodium: 140 mEq/L (ref 135–145)
Total Bilirubin: 0.6 mg/dL (ref 0.2–1.2)
Total Protein: 6.4 g/dL (ref 6.0–8.3)

## 2020-03-15 LAB — LIPID PANEL
Cholesterol: 119 mg/dL (ref 0–200)
HDL: 47.6 mg/dL (ref >39.00)
LDL Cholesterol: 62 mg/dL (ref 0–99)
NonHDL: 71.04
Total CHOL/HDL Ratio: 2
Triglycerides: 46 mg/dL (ref 0.0–149.0)
VLDL: 9.2 mg/dL (ref 0.0–40.0)

## 2020-03-15 LAB — TSH: TSH: 0.76 u[IU]/mL (ref 0.35–4.50)

## 2020-03-21 ENCOUNTER — Other Ambulatory Visit: Payer: Self-pay

## 2020-03-21 ENCOUNTER — Ambulatory Visit (INDEPENDENT_AMBULATORY_CARE_PROVIDER_SITE_OTHER): Payer: PPO

## 2020-03-21 DIAGNOSIS — Z0000 Encounter for general adult medical examination without abnormal findings: Secondary | ICD-10-CM | POA: Diagnosis not present

## 2020-03-21 NOTE — Progress Notes (Signed)
Subjective:   Randall Ali. is a 79 y.o. male who presents for Medicare Annual/Subsequent preventive examination.  Review of Systems: N/A     I connected with the patient today by telephone and verified that I am speaking with the correct person using two identifiers. Location patient: home Location nurse: work Persons participating in the telephone visit: patient, nurse.   I discussed the limitations, risks, security and privacy concerns of performing an evaluation and management service by telephone and the availability of in person appointments. I also discussed with the patient that there may be a patient responsible charge related to this service. The patient expressed understanding and verbally consented to this telephonic visit.        Cardiac Risk Factors include: advanced age (>38men, >19 women);hypertension;male gender;Other (see comment), Risk factor comments: hyperlipidemia     Objective:    Today's Vitals   03/21/20 1532  PainSc: 5    There is no height or weight on file to calculate BMI.  Advanced Directives 03/21/2020 11/25/2019 06/16/2019 06/15/2019 04/12/2019 03/03/2019 01/25/2019  Does Patient Have a Medical Advance Directive? Yes Yes Yes Yes Yes No Yes  Type of Paramedic of [place];Living will [place];Living will [place];Living will - - - Living will  Does patient want to make changes to medical advance directive? - No - Patient declined No - Patient declined - - - No - Patient declined  Copy of [place] in Chart? No - copy requested No - copy requested No - copy requested - - - No - copy requested  Would patient like information on creating a medical advance directive? - - - - - - -  Pre-existing out of facility DNR order (yellow form or pink MOST form) - - - - - - -    Current Medications (verified) Outpatient Encounter Medications as of 03/21/2020  Medication Sig  .  acetaminophen (TYLENOL) 500 MG tablet Take 1,000 mg by mouth at bedtime.  . Ascorbic Acid (VITAMIN C) 1000 MG tablet Take 1,000 mg by mouth daily.  [name] aspirin 81 MG EC tablet Take 81 mg by mouth daily.    . Calcium-Magnesium-Vitamin D (CALCIUM 1200+D3 PO) Take 1 tablet by mouth daily.  . Cholecalciferol (VITAMIN D3) 50 MCG (2000 UT) TABS Take 2,000 Units by mouth daily.  [name] ibuprofen (ADVIL,MOTRIN) 200 MG tablet Take 400 mg by mouth every 6 (six) hours as needed for headache or moderate pain.   [name] ketoconazole (NIZORAL) 2 % shampoo SHAMPOO AFFECTED AREA ONCE DAILY FOR ONE WEEK THEN REPEAT ONCE A WEEK AS MAINTENANCE. LATHER AND LET SIT A FEW MINS BEFORE RINSING  . lisinopril (ZESTRIL) 5 MG tablet Take 1 tablet (5 mg total) by mouth daily. (Patient taking differently: Take 5 mg by mouth every morning. )  . metoprolol succinate (TOPROL-XL) 25 MG 24 hr tablet Take 0.5 tablets (12.5 mg total) by mouth daily. (Patient taking differently: Take 12.5 mg by mouth at bedtime. )  . nitroGLYCERIN (NITROSTAT) 0.4 MG SL tablet Place 1 tablet (0.4 mg total) under the tongue every 5 (five) minutes as needed for chest pain.  . NONFORMULARY OR COMPOUNDED ITEM Trimix (30/1/10)-(Pap/Phent/PGE)  Test Dose 3 44ml vials  Qty #3 [place] [phone] Fax [phone]  . pantoprazole (PROTONIX) 40 MG tablet Take 1 tablet (40 mg total) by mouth daily.  Vladimir Faster Glycol-Propyl Glycol (LUBRICANT EYE DROPS) 0.4-0.3 % SOLN Place 1-2 drops  into both eyes 3 (three) times daily as needed (burning eyes.).  [name] simvastatin (ZOCOR) 40 MG tablet Take 1 tablet (40 mg total) by mouth at bedtime.  . traMADol (ULTRAM) 50 MG tablet TAKE 2 TABLETS BY MOUTH DAILY AS NEEDED  . vitamin B-12 (CYANOCOBALAMIN) 500 MCG tablet Take 500 mcg by mouth daily.  . vitamin E 1000 UNIT capsule Take 1,000 Units by mouth daily. Vitamin E 450 mg  . Zinc 50 MG TABS Take 50 mg by mouth daily.  . clopidogrel (PLAVIX) 75 MG tablet TAKE 1  TABLET BY MOUTH EVERY DAY (Patient not taking: Reported on 03/21/2020)   No facility-administered encounter medications on file as of 03/21/2020.    Allergies (verified) Patient has no known allergies.   History: Past Medical History:  Diagnosis Date  . Allergy    seasonal  . Arthritis    all over- in general   . CAD (coronary artery disease)    a. inferior wall MI 10/01 s/p PCI/DES to RCA; b. Myoview 3/16 neg for ischemia; c. LHC 8/16: ostLAD 80%, OM1 70%, OM2 70% x 2 lesions, mRCA 30%, dRCA 70% s/p 4-V CABG 01/24/15 (LIMA-LAD, VG- OM1, VG-OM2, VG-PDA)   . Cancer (HCC)    skin, melanoma  . Carotid artery disease ([place])    a. [place] 8/16: 1-39% bilateral ICA stenosis  . Cataract    removed  . Diastolic dysfunction    a. TTE 8/16: EF 55-60%, no RWMA, Gr1DD, calcified mitral annulus, mild biatrial enlargement  . Erectile dysfunction   . GERD (gastroesophageal reflux disease)   . History of elbow surgery   . History of hiatal hernia   . HLD (hyperlipidemia)   . HTN (hypertension)   . Inferior myocardial infarction ([place]) 10/01   stent RCA  . Postoperative wound infection 02/02/2015  . Reflux esophagitis   . Sleep apnea 2017   CPAP at night   Past Surgical History:  Procedure Laterality Date  . arm surgery  2010  . BROW LIFT Bilateral 11/25/2019   Procedure: BROW PTOSIS REPAIR BILATERAL;  Surgeon: Karle Starch, MD;  Location: [facility];  Service: Ophthalmology;  Laterality: Bilateral;  sleep apnea  . CARDIAC CATHETERIZATION  06/24/11  . CARDIAC CATHETERIZATION N/A 01/18/2015   Procedure: Left Heart Cath with coronary angiography;  Surgeon: Minna Merritts, MD;  Location: [facility];  Service: Cardiovascular;  Laterality: N/A;  . CARDIAC CATHETERIZATION N/A 01/18/2015   Procedure: Intravascular Pressure Wire/FFR Study;  Surgeon: [name], MD;  Location: [facility];  Service: Cardiovascular;  Laterality: N/A;  . CAROTID STENT  03/10/2011  .  COLONOSCOPY  2010  . COLONOSCOPY  06/14/2014   Dr Hilarie Fredrickson  . CORONARY ARTERY BYPASS GRAFT N/A 01/24/2015   Procedure: CORONARY ARTERY BYPASS GRAFTING x 4 (LIMA-LAD, SVG-Int 1- Int 2, SVG-PD) ENDOSCOPIC GREATER SAPHENOUS VEIN HARVEST LEFT LEG;  Surgeon: [name], MD;  Location: [facility];  Service: Open Heart Surgery;  Laterality: N/A;  . EMBOLECTOMY  06/15/2019   Procedure: EMBOLECTOMY;  Surgeon: Katha Cabal, MD;  Location: [facility];  Service: Vascular;;  right superficial femoral artery  . ENDARTERECTOMY FEMORAL Right 06/15/2019   Procedure: ENDARTERECTOMY FEMORAL;  Surgeon: Katha Cabal, MD;  Location: [facility];  Service: Vascular;  Laterality: Right;  common femoral profunda femoris superficial femoral  . ESOPHAGOGASTRODUODENOSCOPY (EGD) WITH PROPOFOL N/A 04/24/2016   Procedure: ESOPHAGOGASTRODUODENOSCOPY (EGD) WITH PROPOFOL;  Surgeon: Jerene Bears, MD;  Location: [facility];  Service: Gastroenterology;  Laterality: N/A;  . EYE SURGERY     lasik 15 yrs. ago, cataracts removed - both eyes   . HAMMER TOE SURGERY     right toe  . INSERTION OF ILIAC STENT Right 06/15/2019   Procedure: INSERTION OF ILIAC STENT ( STENTING OF SFA/POP ARTERY );  Surgeon: Katha Cabal, MD;  Location: [facility];  Service: Vascular;  Laterality: Right;  angioplpasty and stent placement: right superficial femoral right tibiopopliteal trunk bilateral common iliac arteries  . LEFT HEART CATH AND CORONARY ANGIOGRAPHY Left 06/10/2017   Procedure: LEFT HEART CATH AND CORONARY ANGIOGRAPHY;  Surgeon: Minna Merritts, MD;  Location: [facility];  Service: Cardiovascular;  Laterality: Left;  . LOWER EXTREMITY ANGIOGRAPHY Left 01/04/2019   Procedure: LOWER EXTREMITY ANGIOGRAPHY;  Surgeon: Katha Cabal, MD;  Location: [facility];  Service: Cardiovascular;  Laterality: Left;  . LOWER EXTREMITY ANGIOGRAPHY Right 01/25/2019   Procedure: LOWER EXTREMITY ANGIOGRAPHY;  Surgeon: Katha Cabal, MD;  Location: [facility];  Service: Cardiovascular;  Laterality: Right;  . NASAL SINUS SURGERY  2008   septpolasty, bilateral turbinate reduction  . SHOULDER ARTHROSCOPY  2012  . TEE WITHOUT CARDIOVERSION N/A 01/24/2015   Procedure: TRANSESOPHAGEAL ECHOCARDIOGRAM (TEE);  Surgeon: [name], MD;  Location: [facility];  Service: Open Heart Surgery;  Laterality: N/A;  . [place]  . UPPER GI ENDOSCOPY  07/2014, 04-24-16   Dr Raquel James  . WRIST SURGERY  2011   Family History  Problem Relation Age of Onset  . Hypertension Mother   . Heart disease Mother   . Hypertension Father   . Diabetes Father   . Heart disease Brother 65  . Cancer Paternal Grandfather   . Colon cancer Neg Hx   . Prostate cancer Neg Hx   . Bladder Cancer Neg Hx   . Kidney cancer Neg Hx    Social History   Socioeconomic History  . Marital status: Single    Spouse name: Not on file  . Number of children: Not on file  . Years of education: 53  . Highest education level: Not on file  Occupational History  . Occupation: retired    Comment: Agricultural consultant  Tobacco Use  . Smoking status: Former Smoker    Packs/day: 2.50    Years: 40.00    Pack years: 100.00    Types: Cigarettes    Quit date: 03/24/2000    Years since quitting: 20.0  . Smokeless tobacco: Never Used  Vaping Use  . Vaping Use: Never used  Substance and Sexual Activity  . Alcohol use: Yes    Alcohol/week: 5.0 standard drinks    Types: 5 Shots of liquor per week    Comment: occ  . Drug use: No  . Sexual activity: Yes  Other Topics Concern  . Not on file  Social History Narrative   Singled; lives with son and dog    Occ: retired, back part time at [employer];    Activity: gym 4-5d/wk   Diet: good water, fruits/vegetables daily   Caffeine Use-yes   Social Determinants of Health   Financial Resource Strain: Low Risk   . Difficulty of Paying Living Expenses: Not hard at all  Food Insecurity: No Food Insecurity    . Worried About Charity fundraiser in the Last Year: Never true  . Ran Out of Food in the Last Year: Never true  Transportation Needs: No Transportation  Needs  . Lack of Transportation (Medical): No  . Lack of Transportation (Non-Medical): No  Physical Activity: Sufficiently Active  . Days of Exercise per Week: 7 days  . Minutes of Exercise per Session: 60 min  Stress: No Stress Concern Present  . Feeling of Stress : Not at all  Social Connections:   . Frequency of Communication with Friends and Family: Not on file  . Frequency of Social Gatherings with Friends and Family: Not on file  . Attends Religious Services: Not on file  . Active Member of Clubs or Organizations: Not on file  . Attends Archivist Meetings: Not on file  . Marital Status: Not on file    Tobacco Counseling Counseling given: Not Answered   Clinical Intake:  Pre-visit preparation completed: Yes  Pain : 0-10 Pain Score: 5  Pain Type: Chronic pain Pain Location: Back Pain Orientation: Lower Pain Descriptors / Indicators: Aching Pain Onset: More than a month ago Pain Frequency: Intermittent     Nutritional Risks: None Diabetes: No  How often do you need to have someone help you when you read instructions, pamphlets, or other written materials from your doctor or pharmacy?: 1 - Never What is the last grade level you completed in school?: 12th  Diabetic: No Nutrition Risk Assessment:  Has the patient had any N/V/D within the last 2 months?  No  Does the patient have any non-healing wounds?  No  Has the patient had any unintentional weight loss or weight gain?  No   Diabetes:  Is the patient diabetic?  No  If diabetic, was a CBG obtained today?  N/A Did the patient bring in their glucometer from home?  N/A How often do you monitor your CBG's? N/A.   Financial Strains and Diabetes Management:  Are you having any financial strains with the device, your supplies or your medication?  N/A.  Does the patient want to be seen by Chronic Care Management for management of their diabetes?  N/A Would the patient like to be referred to a Nutritionist or for Diabetic Management?  N/A     Interpreter Needed?: No  Information entered by :: CJohnson, LPN   Activities of Daily Living In your present state of health, do you have any difficulty performing the following activities: 03/21/2020 11/25/2019  Hearing? Y N  Comment wears hearing aids -  Vision? N N  Difficulty concentrating or making decisions? N N  Walking or climbing stairs? N N  Dressing or bathing? N N  Doing errands, shopping? N -  Preparing Food and eating ? N -  Using the Toilet? N -  In the past six months, have you accidently leaked urine? N -  Do you have problems with loss of bowel control? N -  Managing your Medications? N -  Managing your Finances? N -  Housekeeping or managing your Housekeeping? N -  Some recent data might be hidden    Patient Care Team: Ria Bush, MD as PCP - General (Family Medicine) [name], [name], MD as PCP - Cardiology (Cardiology) [name], MD (Internal Medicine) Minna Merritts, MD as Consulting Physician (Cardiology) Pieter Partridge, DO as Consulting Physician (Neurology)  Indicate any recent Medical Services you may have received from other than [facility] providers in the past year (date may be approximate).     Assessment:   This is a routine wellness examination for [place].  Hearing/Vision screen  Hearing Screening   125Hz  250Hz  500Hz  1000Hz  2000Hz   3000Hz  4000Hz  6000Hz  8000Hz   Right ear:           Left ear:           Vision Screening Comments: Patient gets annual eye exams   Dietary issues and exercise activities discussed: Current Exercise Habits: Structured exercise class, Type of exercise: strength training/weights, Time (Minutes): 60, Frequency (Times/Week): 7, Weekly Exercise (Minutes/Week): 420, Intensity: Moderate, Exercise limited by: None  identified  Goals    . Increase physical activity     Starting 07/14/16, I will continue to exercise at least 60 min 4 days per week.     . Increase physical activity     Starting 08/24/2017, I will continue to exercise at gym for 60 minutes 3-4 days per week.     . Patient Stated     01/24/2019, stay healthy    . Patient Stated     03/21/2020, I will continue to go to the gym 6-7 days a week for about 60 minutes.      Depression Screen PHQ 2/9 Scores 03/21/2020 01/24/2019 08/24/2017 07/14/2016 02/06/2016 10/08/2015 05/10/2015  PHQ - 2 Score 0 0 0 0 0 0 0  PHQ- 9 Score 0 0 0 - - - -    Fall Risk Fall Risk  03/21/2020 03/03/2019 01/24/2019 04/20/2018 08/24/2017  Falls in the past year? 0 0 0 0 No  Number falls in past yr: 0 0 - - -  Injury with Fall? 0 0 - - -  Risk for fall due to : Medication side effect - Medication side effect - -  Follow up Falls evaluation completed;Falls prevention discussed - Falls evaluation completed;Falls prevention discussed Falls evaluation completed -    Any stairs in or around the home? Yes  If so, are there any without handrails? No  Home free of loose throw rugs in walkways, pet beds, electrical cords, etc? Yes  Adequate lighting in your home to reduce risk of falls? Yes   ASSISTIVE DEVICES UTILIZED TO PREVENT FALLS:  Life alert? No  Use of a cane, walker or w/c? No  Grab bars in the bathroom? No  Shower chair or bench in shower? No  Elevated toilet seat or a handicapped toilet? No   TIMED UP AND GO:  Was the test performed? N/A, telephonic visit.    Cognitive Function: MMSE - Mini Mental State Exam 03/21/2020 01/24/2019 08/24/2017 07/14/2016  Orientation to time 5 5 5 5   Orientation to Place 5 5 5 5   Registration 3 3 3 3   Attention/ Calculation 3 2 0 0  Attention/Calculation-comments - did not get r,o,w - -  Recall 2 3 2 2   Recall-comments - - unable to recall 1 of 3 words pt was unable to recall 1 of 3 words  Language- name 2 objects - 0 0 0    Language- repeat 1 1 1 1   Language- follow 3 step command - 0 3 3  Language- read & follow direction - 0 0 0  Write a sentence - 0 0 0  Copy design - 0 0 0  Total score - 19 19 19   Mini Cog  Mini-Cog screen was completed. Maximum score is 22. A value of 0 denotes this part of the MMSE was not completed or the patient failed this part of the Mini-Cog screening.       Immunizations Immunization History  Administered Date(s) Administered  . Fluad Quad(high Dose 65+) 03/17/2019  . Influenza Split 02/27/2012, 04/10/2014  . Influenza, High Dose  Seasonal PF 04/05/2013, 02/08/2016  . Influenza,inj,Quad PF,6+ Mos 03/22/2015, 02/25/2017, 03/11/2018  . PFIZER SARS-COV-2 Vaccination 06/30/2019, 07/21/2019  . Pneumococcal Conjugate-13 04/13/2015  . Pneumococcal Polysaccharide-23 02/26/2005, 02/27/2012  . Tdap 04/18/2015  . Zoster 04/06/2011  . Zoster Recombinat (Shingrix) 09/23/2017, 02/12/2018    TDAP status: Up to date Flu Vaccine status: due, will get at upcoming physical  Pneumococcal vaccine status: Up to date Covid-19 vaccine status: Completed vaccines  Qualifies for Shingles Vaccine? Yes   Zostavax completed Yes   Shingrix Completed?: Yes  Screening Tests Health Maintenance  Topic Date Due  . INFLUENZA VACCINE  01/08/2020  . TETANUS/TDAP  04/17/2025  . COVID-19 Vaccine  Completed  . Hepatitis C Screening  Completed  . PNA vac Low Risk Adult  Completed    Health Maintenance  Health Maintenance Due  Topic Date Due  . INFLUENZA VACCINE  01/08/2020    Colorectal cancer screening: Completed 08/24/2014, no longer required.  Lung Cancer Screening: (Low Dose CT Chest recommended if Age 39-80 years, 30 pack-year currently smoking OR have quit w/in 15years.) does not qualify.     Additional Screening:  Hepatitis C Screening: does qualify; Completed 11/02/2014  Vision Screening: Recommended annual ophthalmology exams for early detection of glaucoma and other disorders  of the eye. Is the patient up to date with their annual eye exam?  Yes  Who is the provider or what is the name of the office in which the patient attends annual eye exams? [facility] If pt is not established with a provider, would they like to be referred to a provider to establish care? No .   Dental Screening: Recommended annual dental exams for proper oral hygiene  Community Resource Referral / Chronic Care Management: CRR required this visit?  No   CCM required this visit?  No      Plan:     I have personally reviewed and noted the following in the patient's chart:   . Medical and social history . Use of alcohol, tobacco or illicit drugs  . Current medications and supplements . Functional ability and status . Nutritional status . Physical activity . Advanced directives . List of other physicians . Hospitalizations, surgeries, and ER visits in previous 12 months . Vitals . Screenings to include cognitive, depression, and falls . Referrals and appointments  In addition, I have reviewed and discussed with patient certain preventive protocols, quality metrics, and best practice recommendations. A written personalized care plan for preventive services as well as general preventive health recommendations were provided to patient.   Due to this being a telephonic visit, the after visit summary with patients personalized plan was offered to patient via office or my-chart.  Patient preferred to pick up at office at next visit or via mychart.   Andrez Grime, LPN   [id number]/88/[id number]

## 2020-03-21 NOTE — Patient Instructions (Signed)
[name] , Thank you for taking time to come for your Medicare Wellness Visit. I appreciate your ongoing commitment to your health goals. Please review the following plan we discussed and let me know if I can assist you in the future.   Screening recommendations/referrals: Colonoscopy: Up to date, completed 08/24/2014, no longer required  Recommended yearly ophthalmology/optometry visit for glaucoma screening and checkup Recommended yearly dental visit for hygiene and checkup  Vaccinations: Influenza vaccine: due, will get at upcoming physical Pneumococcal vaccine: Completed series Tdap vaccine: Up to date, completed 04/18/2015, due 04/2025 Shingles vaccine: Completed series   Covid-19: Completed series  Advanced directives: Please bring a copy of your POA (Power of Attorney) and/or Living Will to your next appointment.   Conditions/risks identified: hypertension, hyperlipidemia  Next appointment: Follow up in one year for your annual wellness visit.   Preventive Care 21 Years and Older, Male Preventive care refers to lifestyle choices and visits with your health care provider that can promote health and wellness. What does preventive care include?  A yearly physical exam. This is also called an annual well check.  Dental exams once or twice a year.  Routine eye exams. Ask your health care provider how often you should have your eyes checked.  Personal lifestyle choices, including:  Daily care of your teeth and gums.  Regular physical activity.  Eating a healthy diet.  Avoiding tobacco and drug use.  Limiting alcohol use.  Practicing safe sex.  Taking low doses of aspirin every day.  Taking vitamin and mineral supplements as recommended by your health care provider. What happens during an annual well check? The services and screenings done by your health care provider during your annual well check will depend on your age, overall health, lifestyle risk factors, and  family history of disease. Counseling  Your health care provider may ask you questions about your:  Alcohol use.  Tobacco use.  Drug use.  Emotional well-being.  Home and relationship well-being.  Sexual activity.  Eating habits.  History of falls.  Memory and ability to understand (cognition).  Work and work Statistician. Screening  You may have the following tests or measurements:  Height, weight, and BMI.  Blood pressure.  Lipid and cholesterol levels. These may be checked every 5 years, or more frequently if you are over 67 years old.  Skin check.  Lung cancer screening. You may have this screening every year starting at age 26 if you have a 30-pack-year history of smoking and currently smoke or have quit within the past 15 years.  Fecal occult blood test (FOBT) of the stool. You may have this test every year starting at age 68.  Flexible sigmoidoscopy or colonoscopy. You may have a sigmoidoscopy every 5 years or a colonoscopy every 10 years starting at age 39.  Prostate cancer screening. Recommendations will vary depending on your family history and other risks.  Hepatitis C blood test.  Hepatitis B blood test.  Sexually transmitted disease (STD) testing.  Diabetes screening. This is done by checking your blood sugar (glucose) after you have not eaten for a while (fasting). You may have this done every 1-3 years.  Abdominal aortic aneurysm (AAA) screening. You may need this if you are a current or former smoker.  Osteoporosis. You may be screened starting at age 18 if you are at high risk. Talk with your health care provider about your test results, treatment options, and if necessary, the need for more tests. Vaccines  Your health  care provider may recommend certain vaccines, such as:  Influenza vaccine. This is recommended every year.  Tetanus, diphtheria, and acellular pertussis (Tdap, Td) vaccine. You may need a Td booster every 10 years.  Zoster  vaccine. You may need this after age 41.  Pneumococcal 13-valent conjugate (PCV13) vaccine. One dose is recommended after age 39.  Pneumococcal polysaccharide (PPSV23) vaccine. One dose is recommended after age 40. Talk to your health care provider about which screenings and vaccines you need and how often you need them. This information is not intended to replace advice given to you by your health care provider. Make sure you discuss any questions you have with your health care provider. Document Released: 06/22/2015 Document Revised: 02/13/2016 Document Reviewed: 03/27/2015 Elsevier Interactive Patient Education  2017 [place] Prevention in the Home Falls can cause injuries. They can happen to people of all ages. There are many things you can do to make your home safe and to help prevent falls. What can I do on the outside of my home?  Regularly fix the edges of walkways and driveways and fix any cracks.  Remove anything that might make you trip as you walk through a door, such as a raised step or threshold.  Trim any bushes or trees on the path to your home.  Use bright outdoor lighting.  Clear any walking paths of anything that might make someone trip, such as rocks or tools.  Regularly check to see if handrails are loose or broken. Make sure that both sides of any steps have handrails.  Any raised decks and porches should have guardrails on the edges.  Have any leaves, snow, or ice cleared regularly.  Use sand or salt on walking paths during winter.  Clean up any spills in your garage right away. This includes oil or grease spills. What can I do in the bathroom?  Use night lights.  Install grab bars by the toilet and in the tub and shower. Do not use towel bars as grab bars.  Use non-skid mats or decals in the tub or shower.  If you need to sit down in the shower, use a plastic, non-slip stool.  Keep the floor dry. Clean up any water that spills on the  floor as soon as it happens.  Remove soap buildup in the tub or shower regularly.  Attach bath mats securely with double-sided non-slip rug tape.  Do not have throw rugs and other things on the floor that can make you trip. What can I do in the bedroom?  Use night lights.  Make sure that you have a light by your bed that is easy to reach.  Do not use any sheets or blankets that are too big for your bed. They should not hang down onto the floor.  Have a firm chair that has side arms. You can use this for support while you get dressed.  Do not have throw rugs and other things on the floor that can make you trip. What can I do in the kitchen?  Clean up any spills right away.  Avoid walking on wet floors.  Keep items that you use a lot in easy-to-reach places.  If you need to reach something above you, use a strong step stool that has a grab bar.  Keep electrical cords out of the way.  Do not use floor polish or wax that makes floors slippery. If you must use wax, use non-skid floor wax.  Do not have  throw rugs and other things on the floor that can make you trip. What can I do with my stairs?  Do not leave any items on the stairs.  Make sure that there are handrails on both sides of the stairs and use them. Fix handrails that are broken or loose. Make sure that handrails are as long as the stairways.  Check any carpeting to make sure that it is firmly attached to the stairs. Fix any carpet that is loose or worn.  Avoid having throw rugs at the top or bottom of the stairs. If you do have throw rugs, attach them to the floor with carpet tape.  Make sure that you have a light switch at the top of the stairs and the bottom of the stairs. If you do not have them, ask someone to add them for you. What else can I do to help prevent falls?  Wear shoes that:  Do not have high heels.  Have rubber bottoms.  Are comfortable and fit you well.  Are closed at the toe. Do not wear  sandals.  If you use a stepladder:  Make sure that it is fully opened. Do not climb a closed stepladder.  Make sure that both sides of the stepladder are locked into place.  Ask someone to hold it for you, if possible.  Clearly mark and make sure that you can see:  Any grab bars or handrails.  First and last steps.  Where the edge of each step is.  Use tools that help you move around (mobility aids) if they are needed. These include:  Canes.  Walkers.  Scooters.  Crutches.  Turn on the lights when you go into a dark area. Replace any light bulbs as soon as they burn out.  Set up your furniture so you have a clear path. Avoid moving your furniture around.  If any of your floors are uneven, fix them.  If there are any pets around you, be aware of where they are.  Review your medicines with your doctor. Some medicines can make you feel dizzy. This can increase your chance of falling. Ask your doctor what other things that you can do to help prevent falls. This information is not intended to replace advice given to you by your health care provider. Make sure you discuss any questions you have with your health care provider. Document Released: 03/22/2009 Document Revised: 11/01/2015 Document Reviewed: 06/30/2014 Elsevier Interactive Patient Education  2017 [employer].

## 2020-03-21 NOTE — Progress Notes (Signed)
PCP notes:  Health Maintenance: Flu- due   Abnormal Screenings: MMSE score- 19   Patient concerns: Discuss issues with his abdominal hernia   Nurse concerns: none   Next PCP appt.: 03/23/2020 @ 11:30 am

## 2020-03-22 ENCOUNTER — Encounter: Payer: PPO | Admitting: Family Medicine

## 2020-03-23 ENCOUNTER — Other Ambulatory Visit: Payer: Self-pay

## 2020-03-23 ENCOUNTER — Ambulatory Visit (INDEPENDENT_AMBULATORY_CARE_PROVIDER_SITE_OTHER): Payer: PPO | Admitting: Family Medicine

## 2020-03-23 ENCOUNTER — Encounter: Payer: Self-pay | Admitting: Family Medicine

## 2020-03-23 VITALS — BP 132/68 | HR 72 | Temp 97.6°F | Ht 68.0 in | Wt 204.6 lb

## 2020-03-23 DIAGNOSIS — R351 Nocturia: Secondary | ICD-10-CM

## 2020-03-23 DIAGNOSIS — I2510 Atherosclerotic heart disease of native coronary artery without angina pectoris: Secondary | ICD-10-CM

## 2020-03-23 DIAGNOSIS — G4733 Obstructive sleep apnea (adult) (pediatric): Secondary | ICD-10-CM | POA: Diagnosis not present

## 2020-03-23 DIAGNOSIS — I6523 Occlusion and stenosis of bilateral carotid arteries: Secondary | ICD-10-CM

## 2020-03-23 DIAGNOSIS — I10 Essential (primary) hypertension: Secondary | ICD-10-CM | POA: Diagnosis not present

## 2020-03-23 DIAGNOSIS — M5442 Lumbago with sciatica, left side: Secondary | ICD-10-CM

## 2020-03-23 DIAGNOSIS — Z0000 Encounter for general adult medical examination without abnormal findings: Secondary | ICD-10-CM

## 2020-03-23 DIAGNOSIS — Z951 Presence of aortocoronary bypass graft: Secondary | ICD-10-CM

## 2020-03-23 DIAGNOSIS — Z23 Encounter for immunization: Secondary | ICD-10-CM

## 2020-03-23 DIAGNOSIS — N401 Enlarged prostate with lower urinary tract symptoms: Secondary | ICD-10-CM | POA: Diagnosis not present

## 2020-03-23 DIAGNOSIS — M6208 Separation of muscle (nontraumatic), other site: Secondary | ICD-10-CM | POA: Diagnosis not present

## 2020-03-23 DIAGNOSIS — I70213 Atherosclerosis of native arteries of extremities with intermittent claudication, bilateral legs: Secondary | ICD-10-CM

## 2020-03-23 DIAGNOSIS — Z7189 Other specified counseling: Secondary | ICD-10-CM | POA: Diagnosis not present

## 2020-03-23 DIAGNOSIS — E782 Mixed hyperlipidemia: Secondary | ICD-10-CM

## 2020-03-23 DIAGNOSIS — G8929 Other chronic pain: Secondary | ICD-10-CM

## 2020-03-23 DIAGNOSIS — I771 Stricture of artery: Secondary | ICD-10-CM

## 2020-03-23 NOTE — Progress Notes (Signed)
This visit was conducted in person.  BP 132/68 (BP Location: Left Arm, Patient Position: Sitting, Cuff Size: Large)   Pulse 72   Temp 97.6 F (36.4 C) (Temporal)   Ht 5\' 8"  (1.727 m)   Wt 204 lb 9 oz (92.8 kg)   SpO2 93%   BMI 31.10 kg/m    CC: CPE Subjective:    Patient ID: Randall Lofts., male    DOB: 09/17/1940, 79 y.o.   MRN: [id number]  HPI: Randall Dubin. is a 79 y.o. male presenting on 03/23/2020 for Annual Exam (Prt 2. )   Saw health advisor last week for medicare wellness visit. Note reviewed.   No exam data present    Clinical Support from 03/21/2020 in [place] at [facility] Total Score 0      Fall Risk  03/21/2020 03/03/2019 01/24/2019 04/20/2018 08/24/2017  Falls in the past year? 0 0 0 0 No  Number falls in past yr: 0 0 - - -  Injury with Fall? 0 0 - - -  Risk for fall due to : Medication side effect - Medication side effect - -  Follow up Falls evaluation completed;Falls prevention discussed - Falls evaluation completed;Falls prevention discussed Falls evaluation completed -    Avid softball player   Known carotid stenosis and R subclavian stenosis followed by [facility]. Planned yearly monitoring of both. Plavix recently stopped by [facility].   Orthostatic dizziness earlier in the year improved off flomax. No change in urination off flomax.   Known ventral hernia - notes dyspnea when sitting after large meal. No abd pain, nausea, indigestion, reflux. No significant dyspnea with exertion, no chest pain.   Ongoing chronic lower back pain managed with tramadol 100mg  in am. Worse pain after playing ball or prolonged standing. Considering return to back doctor for further evaluation.   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  Prostate cancer screening - will stop Lung cancer screening -69 PY hx, quit 2001, not eligible Flu shot -yearly  Tdap  2016 Pneumovax 2013, prevnar 2016 zostavax 2012 Randall Ali 06/2019, 07/2019, considering booster shingrix - completed 2019 Advanced directive discussion -has completed this at home. HCPOA would be daughter. Asked to bring [place] a copy.  Seat belt use discussed Sunscreen usediscussed. No changing moles on skin. Sees derm q3 mo.  Ex smoker - 46 PY hx, quit 2001  Alcohol - limited on weekends  Dentist q59mo  Eye exam yearly  Bowel - no constipaiton Bladder - no incontinence   Single; lives with son and dog  Occ: retired, back part time at [employer]  Activity: gym 4-5d/wk - avid Engineer, maintenance (IT) Diet: good water, fruits/vegetables daily     Relevant past medical, surgical, family and social history reviewed and updated as indicated. Interim medical history since our last visit reviewed. Allergies and medications reviewed and updated. Outpatient Medications Prior to Visit  Medication Sig Dispense Refill  . acetaminophen (TYLENOL) 500 MG tablet Take 1,000 mg by mouth at bedtime.    . Ascorbic Acid (VITAMIN C) 1000 MG tablet Take 1,000 mg by mouth daily.    [name] aspirin 81 MG EC tablet Take 81 mg by mouth daily.      . Calcium-Magnesium-Vitamin D (CALCIUM 1200+D3 PO) Take 1 tablet by mouth daily.    . Cholecalciferol (VITAMIN D3) 50 MCG (2000 UT) TABS Take 2,000 Units by mouth daily.    [name]  ibuprofen (ADVIL,MOTRIN) 200 MG tablet Take 400 mg by mouth every 6 (six) hours as needed for headache or moderate pain.     [name] lisinopril (ZESTRIL) 5 MG tablet Take 1 tablet (5 mg total) by mouth daily. (Patient taking differently: Take 5 mg by mouth every morning. ) 90 tablet 3  . metoprolol succinate (TOPROL-XL) 25 MG 24 hr tablet Take 0.5 tablets (12.5 mg total) by mouth daily. (Patient taking differently: Take 12.5 mg by mouth at bedtime. ) 45 tablet 3  . nitroGLYCERIN (NITROSTAT) 0.4 MG SL tablet Place 1 tablet (0.4 mg total) under the tongue every 5 (five) minutes as needed for chest pain. 25  tablet 3  . NONFORMULARY OR COMPOUNDED ITEM Trimix (30/1/10)-(Pap/Phent/PGE)  Test Dose 3 41ml vials  Qty #3 Refills 0  [place] [phone] Fax [phone] 3 each 0  . pantoprazole (PROTONIX) 40 MG tablet Take 1 tablet (40 mg total) by mouth daily. 90 tablet 3  . Polyethyl Glycol-Propyl Glycol (LUBRICANT EYE DROPS) 0.4-0.3 % SOLN Place 1-2 drops into both eyes 3 (three) times daily as needed (burning eyes.).    [name] simvastatin (ZOCOR) 40 MG tablet Take 1 tablet (40 mg total) by mouth at bedtime. 90 tablet 0  . traMADol (ULTRAM) 50 MG tablet TAKE 2 TABLETS BY MOUTH DAILY AS NEEDED 60 tablet 0  . vitamin B-12 (CYANOCOBALAMIN) 500 MCG tablet Take 500 mcg by mouth daily.    . vitamin E 1000 UNIT capsule Take 1,000 Units by mouth daily. Vitamin E 450 mg    . Zinc 50 MG TABS Take 50 mg by mouth daily.    . clopidogrel (PLAVIX) 75 MG tablet TAKE 1 TABLET BY MOUTH EVERY DAY (Patient not taking: Reported on 03/21/2020) 90 tablet 3  . ketoconazole (NIZORAL) 2 % shampoo SHAMPOO AFFECTED AREA ONCE DAILY FOR ONE WEEK THEN REPEAT ONCE A WEEK AS MAINTENANCE. LATHER AND LET SIT A FEW MINS BEFORE RINSING     No facility-administered medications prior to visit.     Per HPI unless specifically indicated in ROS section below Review of Systems  Constitutional: Negative for activity change, appetite change, chills, fatigue, fever and unexpected weight change.  HENT: Negative for hearing loss.   Eyes: Negative for visual disturbance.  Respiratory: Negative for cough, chest tightness, shortness of breath and wheezing.   Cardiovascular: Negative for chest pain, palpitations and leg swelling.  Gastrointestinal: Negative for abdominal distention, abdominal pain, blood in stool, constipation, diarrhea, nausea and vomiting.  Genitourinary: Negative for difficulty urinating and hematuria.  Musculoskeletal: Negative for arthralgias, myalgias and neck pain.  Skin: Negative for rash.  Neurological:  Negative for dizziness, seizures, syncope and headaches.  Hematological: Negative for adenopathy. Does not bruise/bleed easily.  Psychiatric/Behavioral: Negative for dysphoric mood. The patient is not nervous/anxious.    Objective:  BP 132/68 (BP Location: Left Arm, Patient Position: Sitting, Cuff Size: Large)   Pulse 72   Temp 97.6 F (36.4 C) (Temporal)   Ht 5\' 8"  (1.727 m)   Wt 204 lb 9 oz (92.8 kg)   SpO2 93%   BMI 31.10 kg/m   Wt Readings from Last 3 Encounters:  03/23/20 204 lb 9 oz (92.8 kg)  02/09/20 203 lb (92.1 kg)  01/11/20 206 lb (93.4 kg)      Physical Exam Vitals and nursing note reviewed.  Constitutional:      General: He is not in acute distress.    Appearance: Normal appearance. He is well-developed. He is not ill-appearing.  HENT:     Head: Normocephalic and atraumatic.     Right Ear: Hearing, tympanic membrane, ear canal and external ear normal.     Left Ear: Hearing, tympanic membrane, ear canal and external ear normal.  Eyes:     General: No scleral icterus.    Extraocular Movements: Extraocular movements intact.     Conjunctiva/sclera: Conjunctivae normal.     Pupils: Pupils are equal, round, and reactive to light.  Neck:     Thyroid: No thyroid mass or thyromegaly.     Vascular: No carotid bruit.  Cardiovascular:     Rate and Rhythm: Normal rate and regular rhythm.     Pulses: Normal pulses.          Radial pulses are 2+ on the right side and 2+ on the left side.     Heart sounds: Normal heart sounds. No murmur heard.   Pulmonary:     Effort: Pulmonary effort is normal. No respiratory distress.     Breath sounds: Normal breath sounds. No wheezing, rhonchi or rales.  Abdominal:     General: Abdomen is flat. Bowel sounds are normal. There is no distension.     Palpations: Abdomen is soft. There is no mass.     Tenderness: There is no abdominal tenderness. There is no guarding or rebound.     Hernia: A hernia is present. Hernia is present in the  ventral area (nontender).  Musculoskeletal:        General: Normal range of motion.     Cervical back: Normal range of motion and neck supple.     Right lower leg: No edema.     Left lower leg: No edema.  Lymphadenopathy:     Cervical: No cervical adenopathy.  Skin:    General: Skin is warm and dry.     Findings: No rash.  Neurological:     General: No focal deficit present.     Mental Status: He is alert and oriented to person, place, and time.     Comments: CN grossly intact, station and gait intact  Psychiatric:        Mood and Affect: Mood normal.        Behavior: Behavior normal.        Thought Content: Thought content normal.        Judgment: Judgment normal.       Results for orders placed or performed in visit on 03/15/20  CBC with Differential/Platelet  Result Value Ref Range   WBC 7.0 4.0 - 10.5 K/uL   RBC 4.71 4.22 - 5.81 Mil/uL   Hemoglobin 14.7 13.0 - 17.0 g/dL   HCT 43.4 39 - 52 %   MCV 92.1 78.0 - 100.0 fl   MCHC 33.9 30.0 - 36.0 g/dL   RDW 13.0 11.5 - 15.5 %   Platelets 218.0 150 - 400 K/uL   Neutrophils Relative % 65.3 43 - 77 %   Lymphocytes Relative 21.2 12 - 46 %   Monocytes Relative 11.1 3 - 12 %   Eosinophils Relative 1.5 0 - 5 %   Basophils Relative 0.9 0 - 3 %   Neutro Abs 4.6 1.4 - 7.7 K/uL   Lymphs Abs 1.5 0.7 - 4.0 K/uL   Monocytes Absolute 0.8 0.1 - 1.0 K/uL   Eosinophils Absolute 0.1 0.0 - 0.7 K/uL   Basophils Absolute 0.1 0.0 - 0.1 K/uL  TSH  Result Value Ref Range   TSH 0.76 0.35 - 4.50 uIU/mL  Comprehensive metabolic panel  Result Value Ref Range   Sodium 140 135 - 145 mEq/L   Potassium 4.5 3.5 - 5.1 mEq/L   Chloride 105 96 - 112 mEq/L   CO2 28 19 - 32 mEq/L   Glucose, Bld 90 70 - 99 mg/dL   BUN 24 (H) 6 - 23 mg/dL   Creatinine, Ser 1.14 0.40 - 1.50 mg/dL   Total Bilirubin 0.6 0.2 - 1.2 mg/dL   Alkaline Phosphatase 51 39 - 117 U/L   AST 11 0 - 37 U/L   ALT 14 0 - 53 U/L   Total Protein 6.4 6.0 - 8.3 g/dL   Albumin 4.3 3.5 -  5.2 g/dL   GFR 61.24 >60.00 mL/min   Calcium 9.6 8.4 - 10.5 mg/dL  Lipid panel  Result Value Ref Range   Cholesterol 119 0 - 200 mg/dL   Triglycerides 46.0 0 - 149 mg/dL   HDL 47.60 >39.00 mg/dL   VLDL 9.2 0.0 - 40.0 mg/dL   LDL Cholesterol 62 0 - 99 mg/dL   Total CHOL/HDL Ratio 2    NonHDL 71.04    Lab Results  Component Value Date   PSA 0.27 01/06/2019   PSA 0.39 08/24/2017   PSA 0.37 07/14/2016    Assessment & Plan:  This visit occurred during the SARS-CoV-2 public health emergency.  Safety protocols were in place, including screening questions prior to the visit, additional usage of staff PPE, and extensive cleaning of exam room while observing appropriate contact time as indicated for disinfecting solutions.   Problem List Items Addressed This Visit    Subclavian artery stenosis, right (HCC)    Stable. Followed by [facility].       S/P CABG x 4   OSA (obstructive sleep apnea)    Continue CPAP followed by pulm.       Hypertension    Chronic, stable. Continue current regimen of lisinopril and metoprolol.       Hyperlipidemia    Chronic, stable on simvastatin - continue The ASCVD Risk score Randall Bussing DC Jr., et al., 2013) failed to calculate for the following reasons:   The valid total cholesterol range is 130 to 320 mg/dL       Health maintenance examination - Primary    Preventative protocols reviewed and updated unless pt declined. Discussed healthy diet and lifestyle.       Diastasis recti    Vs ventral hernia - no pain at this time although at times he notes some postprandial dyspnea. Will monitor for now.       Chronic back pain    Chronic, managed with tramadol 100mg  in am with occasional PM dose.  Encouraged he return to see back doctor.       Carotid stenosis, asymptomatic, bilateral    Followed by [facility]      CAD, NATIVE VESSEL    Continues aspirin and statin. Recently plavix stopped.       BPH (benign prostatic hyperplasia)    Stable period off  treatment.  flomax caused orthostatic dizziness.       Atherosclerosis of native arteries of extremity with intermittent claudication (HCC)    S/p endarterectomies, patch angioplasty stent placement to R leg arteries 06/2019.  Appreciate [facility] care.       Advanced care planning/counseling discussion    Advanced directive discussion -has completed this at home. HCPOA would be daughter. Asked to bring [place] a copy.        Other Visit Diagnoses  Need for influenza vaccination       Relevant Orders   Flu Vaccine QUAD High Dose(Fluad) (Completed)       No orders of the defined types were placed in this encounter.  Orders Placed This Encounter  Procedures  . Flu Vaccine QUAD High Dose(Fluad)    Patient instructions: Call Dr Randall Ali office.  Flu shot today  Consider COVID booster shot.  You are doing well today, return as needed or in 6 months for follow up visit.   Follow up plan: Return in about 6 months (around 09/21/2020) for follow up visit.  Ria Bush, MD

## 2020-03-23 NOTE — Patient Instructions (Addendum)
Call Dr Vena Rua office.  Flu shot today  Consider COVID booster shot.  You are doing well today, return as needed or in 6 months for follow up visit.   Health Maintenance After Age 78 After age 95, you are at a higher risk for certain long-term diseases and infections as well as injuries from falls. Falls are a major cause of broken bones and head injuries in people who are older than age 54. Getting regular preventive care can help to keep you healthy and well. Preventive care includes getting regular testing and making lifestyle changes as recommended by your health care provider. Talk with your health care provider about:  Which screenings and tests you should have. A screening is a test that checks for a disease when you have no symptoms.  A diet and exercise plan that is right for you. What should I know about screenings and tests to prevent falls? Screening and testing are the best ways to find a health problem early. Early diagnosis and treatment give you the best chance of managing medical conditions that are common after age 6. Certain conditions and lifestyle choices may make you more likely to have a fall. Your health care provider may recommend:  Regular vision checks. Poor vision and conditions such as cataracts can make you more likely to have a fall. If you wear glasses, make sure to get your prescription updated if your vision changes.  Medicine review. Work with your health care provider to regularly review all of the medicines you are taking, including over-the-counter medicines. Ask your health care provider about any side effects that may make you more likely to have a fall. Tell your health care provider if any medicines that you take make you feel dizzy or sleepy.  Osteoporosis screening. Osteoporosis is a condition that causes the bones to get weaker. This can make the bones weak and cause them to break more easily.  Blood pressure screening. Blood pressure changes and  medicines to control blood pressure can make you feel dizzy.  Strength and balance checks. Your health care provider may recommend certain tests to check your strength and balance while standing, walking, or changing positions.  Foot health exam. Foot pain and numbness, as well as not wearing proper footwear, can make you more likely to have a fall.  Depression screening. You may be more likely to have a fall if you have a fear of falling, feel emotionally low, or feel unable to do activities that you used to do.  Alcohol use screening. Using too much alcohol can affect your balance and may make you more likely to have a fall. What actions can I take to lower my risk of falls? General instructions  Talk with your health care provider about your risks for falling. Tell your health care provider if: ? You fall. Be sure to tell your health care provider about all falls, even ones that seem minor. ? You feel dizzy, sleepy, or off-balance.  Take over-the-counter and prescription medicines only as told by your health care provider. These include any supplements.  Eat a healthy diet and maintain a healthy weight. A healthy diet includes low-fat dairy products, low-fat (lean) meats, and fiber from whole grains, beans, and lots of fruits and vegetables. Home safety  Remove any tripping hazards, such as rugs, cords, and clutter.  Install safety equipment such as grab bars in bathrooms and safety rails on stairs.  Keep rooms and walkways well-lit. Activity   Follow a regular  exercise program to stay fit. This will help you maintain your balance. Ask your health care provider what types of exercise are appropriate for you.  If you need a cane or walker, use it as recommended by your health care provider.  Wear supportive shoes that have nonskid soles. Lifestyle  Do not drink alcohol if your health care provider tells you not to drink.  If you drink alcohol, limit how much you have: ? 0-1  drink a day for women. ? 0-2 drinks a day for men.  Be aware of how much alcohol is in your drink. In the [place] one drink equals one typical bottle of beer (12 oz), one-half glass of wine (5 oz), or one shot of hard liquor (1 oz).  Do not use any products that contain nicotine or tobacco, such as cigarettes and e-cigarettes. If you need help quitting, ask your health care provider. Summary  Having a healthy lifestyle and getting preventive care can help to protect your health and wellness after age 48.  Screening and testing are the best way to find a health problem early and help you avoid having a fall. Early diagnosis and treatment give you the best chance for managing medical conditions that are more common for people who are older than age 88.  Falls are a major cause of broken bones and head injuries in people who are older than age 54. Take precautions to prevent a fall at home.  Work with your health care provider to learn what changes you can make to improve your health and wellness and to prevent falls. This information is not intended to replace advice given to you by your health care provider. Make sure you discuss any questions you have with your health care provider. Document Revised: 09/16/2018 Document Reviewed: 04/08/2017 Elsevier Patient Education  2020 [employer].

## 2020-03-24 NOTE — Assessment & Plan Note (Signed)
Advanced directive discussion -has completed this at home. HCPOA would be daughter. Asked to bring [place] a copy.

## 2020-03-24 NOTE — Assessment & Plan Note (Signed)
Continues aspirin and statin. Recently plavix stopped.

## 2020-03-24 NOTE — Assessment & Plan Note (Signed)
Continue CPAP followed by pulm.

## 2020-03-24 NOTE — Assessment & Plan Note (Signed)
Followed by [facility]

## 2020-03-24 NOTE — Assessment & Plan Note (Signed)
Vs ventral hernia - no pain at this time although at times he notes some postprandial dyspnea. Will monitor for now.

## 2020-03-24 NOTE — Assessment & Plan Note (Addendum)
Chronic, managed with tramadol 100mg  in am with occasional PM dose.  Encouraged he return to see back doctor.

## 2020-03-24 NOTE — Assessment & Plan Note (Addendum)
Stable. Followed by [facility].

## 2020-03-24 NOTE — Assessment & Plan Note (Signed)
Chronic, stable on simvastatin - continue The ASCVD Risk score Randall Bussing DC Jr., et al., 2013) failed to calculate for the following reasons:   The valid total cholesterol range is 130 to 320 mg/dL

## 2020-03-24 NOTE — Assessment & Plan Note (Addendum)
Chronic, stable. Continue current regimen of lisinopril and metoprolol.

## 2020-03-24 NOTE — Assessment & Plan Note (Signed)
Stable period off treatment.  flomax caused orthostatic dizziness.

## 2020-03-24 NOTE — Assessment & Plan Note (Signed)
Preventative protocols reviewed and updated unless pt declined. Discussed healthy diet and lifestyle.  

## 2020-03-24 NOTE — Assessment & Plan Note (Addendum)
S/p endarterectomies, patch angioplasty stent placement to R leg arteries 06/2019.  Appreciate [facility]VS care.

## 2020-04-05 DIAGNOSIS — M9905 Segmental and somatic dysfunction of pelvic region: Secondary | ICD-10-CM | POA: Diagnosis not present

## 2020-04-05 DIAGNOSIS — M5033 Other cervical disc degeneration, cervicothoracic region: Secondary | ICD-10-CM | POA: Diagnosis not present

## 2020-04-05 DIAGNOSIS — M955 Acquired deformity of pelvis: Secondary | ICD-10-CM | POA: Diagnosis not present

## 2020-04-05 DIAGNOSIS — M9901 Segmental and somatic dysfunction of cervical region: Secondary | ICD-10-CM | POA: Diagnosis not present

## 2020-04-09 DIAGNOSIS — M5033 Other cervical disc degeneration, cervicothoracic region: Secondary | ICD-10-CM | POA: Diagnosis not present

## 2020-04-09 DIAGNOSIS — M955 Acquired deformity of pelvis: Secondary | ICD-10-CM | POA: Diagnosis not present

## 2020-04-09 DIAGNOSIS — M9901 Segmental and somatic dysfunction of cervical region: Secondary | ICD-10-CM | POA: Diagnosis not present

## 2020-04-09 DIAGNOSIS — M9905 Segmental and somatic dysfunction of pelvic region: Secondary | ICD-10-CM | POA: Diagnosis not present

## 2020-04-10 ENCOUNTER — Other Ambulatory Visit: Payer: Self-pay | Admitting: Family Medicine

## 2020-04-10 NOTE — Telephone Encounter (Signed)
Name of Medication: Tramadol Name of Pharmacy: [facility] or Written Date and Quantity: 03/12/20, #60 Last Office Visit and Type: 03/23/20, AWV prt 2 Next Office Visit and Type: 09/28/20, 6 mo f/u Last Controlled Substance Agreement Date: none Last UDS: 01/18/16

## 2020-04-11 DIAGNOSIS — M5033 Other cervical disc degeneration, cervicothoracic region: Secondary | ICD-10-CM | POA: Diagnosis not present

## 2020-04-11 DIAGNOSIS — M9905 Segmental and somatic dysfunction of pelvic region: Secondary | ICD-10-CM | POA: Diagnosis not present

## 2020-04-11 DIAGNOSIS — M955 Acquired deformity of pelvis: Secondary | ICD-10-CM | POA: Diagnosis not present

## 2020-04-11 DIAGNOSIS — M9901 Segmental and somatic dysfunction of cervical region: Secondary | ICD-10-CM | POA: Diagnosis not present

## 2020-04-12 NOTE — Telephone Encounter (Signed)
ERx 

## 2020-04-13 DIAGNOSIS — M5033 Other cervical disc degeneration, cervicothoracic region: Secondary | ICD-10-CM | POA: Diagnosis not present

## 2020-04-13 DIAGNOSIS — M9901 Segmental and somatic dysfunction of cervical region: Secondary | ICD-10-CM | POA: Diagnosis not present

## 2020-04-13 DIAGNOSIS — M9905 Segmental and somatic dysfunction of pelvic region: Secondary | ICD-10-CM | POA: Diagnosis not present

## 2020-04-13 DIAGNOSIS — M955 Acquired deformity of pelvis: Secondary | ICD-10-CM | POA: Diagnosis not present

## 2020-04-18 DIAGNOSIS — M5033 Other cervical disc degeneration, cervicothoracic region: Secondary | ICD-10-CM | POA: Diagnosis not present

## 2020-04-18 DIAGNOSIS — M9901 Segmental and somatic dysfunction of cervical region: Secondary | ICD-10-CM | POA: Diagnosis not present

## 2020-04-18 DIAGNOSIS — M955 Acquired deformity of pelvis: Secondary | ICD-10-CM | POA: Diagnosis not present

## 2020-04-18 DIAGNOSIS — M9905 Segmental and somatic dysfunction of pelvic region: Secondary | ICD-10-CM | POA: Diagnosis not present

## 2020-04-23 ENCOUNTER — Other Ambulatory Visit: Payer: Self-pay | Admitting: Orthopedic Surgery

## 2020-04-23 DIAGNOSIS — M25551 Pain in right hip: Secondary | ICD-10-CM | POA: Diagnosis not present

## 2020-04-23 DIAGNOSIS — M545 Low back pain, unspecified: Secondary | ICD-10-CM

## 2020-04-27 ENCOUNTER — Other Ambulatory Visit: Payer: Self-pay | Admitting: Family Medicine

## 2020-05-01 ENCOUNTER — Other Ambulatory Visit: Payer: Self-pay | Admitting: Nurse Practitioner

## 2020-05-01 DIAGNOSIS — M5116 Intervertebral disc disorders with radiculopathy, lumbar region: Secondary | ICD-10-CM | POA: Diagnosis not present

## 2020-05-01 DIAGNOSIS — M5416 Radiculopathy, lumbar region: Secondary | ICD-10-CM | POA: Diagnosis not present

## 2020-05-01 DIAGNOSIS — G8929 Other chronic pain: Secondary | ICD-10-CM | POA: Diagnosis not present

## 2020-05-01 DIAGNOSIS — M7731 Calcaneal spur, right foot: Secondary | ICD-10-CM | POA: Diagnosis not present

## 2020-05-01 DIAGNOSIS — I700 Atherosclerosis of aorta: Secondary | ICD-10-CM | POA: Diagnosis not present

## 2020-05-01 DIAGNOSIS — M25571 Pain in right ankle and joints of right foot: Secondary | ICD-10-CM | POA: Diagnosis not present

## 2020-05-01 DIAGNOSIS — M5117 Intervertebral disc disorders with radiculopathy, lumbosacral region: Secondary | ICD-10-CM | POA: Diagnosis not present

## 2020-05-01 DIAGNOSIS — I739 Peripheral vascular disease, unspecified: Secondary | ICD-10-CM | POA: Diagnosis not present

## 2020-05-01 DIAGNOSIS — M4807 Spinal stenosis, lumbosacral region: Secondary | ICD-10-CM | POA: Diagnosis not present

## 2020-05-01 DIAGNOSIS — M4726 Other spondylosis with radiculopathy, lumbar region: Secondary | ICD-10-CM | POA: Diagnosis not present

## 2020-05-05 ENCOUNTER — Ambulatory Visit
Admission: RE | Admit: 2020-05-05 | Discharge: 2020-05-05 | Disposition: A | Discharge status: Home / Self Care | Payer: PPO | Source: Ambulatory Visit | Attending: Nurse Practitioner | Admitting: Nurse Practitioner

## 2020-05-05 DIAGNOSIS — M5416 Radiculopathy, lumbar region: Secondary | ICD-10-CM | POA: Diagnosis not present

## 2020-05-05 DIAGNOSIS — M545 Low back pain, unspecified: Secondary | ICD-10-CM | POA: Diagnosis not present

## 2020-05-05 DIAGNOSIS — M4807 Spinal stenosis, lumbosacral region: Secondary | ICD-10-CM | POA: Diagnosis not present

## 2020-05-07 ENCOUNTER — Telehealth (INDEPENDENT_AMBULATORY_CARE_PROVIDER_SITE_OTHER): Payer: Self-pay | Admitting: Vascular Surgery

## 2020-05-07 ENCOUNTER — Other Ambulatory Visit (INDEPENDENT_AMBULATORY_CARE_PROVIDER_SITE_OTHER): Payer: Self-pay | Admitting: Nurse Practitioner

## 2020-05-07 DIAGNOSIS — M79604 Pain in right leg: Secondary | ICD-10-CM

## 2020-05-07 NOTE — Telephone Encounter (Signed)
ABIs with GS or me

## 2020-05-07 NOTE — Telephone Encounter (Signed)
Called & scheduled patient

## 2020-05-07 NOTE — Telephone Encounter (Signed)
Called stating that his right leg is hurting him to the point he can barely walk, the pain has lasted going on 3 weeks. He understands that he has back issues but his doctor wanted him to come in to be seen.Patient was last seen 9/2/21with ABI studies (FB). Please advise.

## 2020-05-08 ENCOUNTER — Ambulatory Visit (INDEPENDENT_AMBULATORY_CARE_PROVIDER_SITE_OTHER): Payer: PPO

## 2020-05-08 ENCOUNTER — Other Ambulatory Visit: Payer: Self-pay | Admitting: Family Medicine

## 2020-05-08 ENCOUNTER — Encounter (INDEPENDENT_AMBULATORY_CARE_PROVIDER_SITE_OTHER): Payer: Self-pay | Admitting: Nurse Practitioner

## 2020-05-08 ENCOUNTER — Other Ambulatory Visit (INDEPENDENT_AMBULATORY_CARE_PROVIDER_SITE_OTHER): Payer: Self-pay | Admitting: Nurse Practitioner

## 2020-05-08 ENCOUNTER — Ambulatory Visit (INDEPENDENT_AMBULATORY_CARE_PROVIDER_SITE_OTHER): Payer: PPO | Admitting: Nurse Practitioner

## 2020-05-08 ENCOUNTER — Other Ambulatory Visit: Payer: Self-pay

## 2020-05-08 VITALS — BP 104/61 | HR 67 | Resp 16 | Wt 208.8 lb

## 2020-05-08 DIAGNOSIS — I10 Essential (primary) hypertension: Secondary | ICD-10-CM | POA: Diagnosis not present

## 2020-05-08 DIAGNOSIS — M79604 Pain in right leg: Secondary | ICD-10-CM

## 2020-05-08 DIAGNOSIS — E782 Mixed hyperlipidemia: Secondary | ICD-10-CM

## 2020-05-08 DIAGNOSIS — I70213 Atherosclerosis of native arteries of extremities with intermittent claudication, bilateral legs: Secondary | ICD-10-CM | POA: Diagnosis not present

## 2020-05-08 NOTE — Telephone Encounter (Signed)
Name of Medication: Tramadol Name of Pharmacy: [facility] or Written Date and Quantity: 04/12/20, #60 Last Office Visit and Type: 03/23/20, AWV prt 2 Next Office Visit and Type: 09/28/20, 6 mo f/u Last Controlled Substance Agreement Date: none Last UDS: 01/18/16, scanned

## 2020-05-09 ENCOUNTER — Ambulatory Visit: Payer: PPO | Admitting: Internal Medicine

## 2020-05-09 NOTE — Telephone Encounter (Signed)
ERx 

## 2020-05-10 DIAGNOSIS — M5033 Other cervical disc degeneration, cervicothoracic region: Secondary | ICD-10-CM | POA: Diagnosis not present

## 2020-05-10 DIAGNOSIS — M955 Acquired deformity of pelvis: Secondary | ICD-10-CM | POA: Diagnosis not present

## 2020-05-10 DIAGNOSIS — M9901 Segmental and somatic dysfunction of cervical region: Secondary | ICD-10-CM | POA: Diagnosis not present

## 2020-05-10 DIAGNOSIS — M9905 Segmental and somatic dysfunction of pelvic region: Secondary | ICD-10-CM | POA: Diagnosis not present

## 2020-05-13 ENCOUNTER — Encounter (INDEPENDENT_AMBULATORY_CARE_PROVIDER_SITE_OTHER): Payer: Self-pay | Admitting: Nurse Practitioner

## 2020-05-13 ENCOUNTER — Other Ambulatory Visit: Payer: PPO

## 2020-05-13 NOTE — Progress Notes (Signed)
Subjective:    Patient ID: Randall Lofts., male    DOB: March 18, 1941, 79 y.o.   MRN: [id number] Chief Complaint  Patient presents with  . Follow-up    right leg pain    The patient presents today for follow-up studies regarding right lower extremity leg pain.  This pain has been going on for the last 3 weeks.  The patient does have a previous history of peripheral arterial disease.  Also has a history of lower back pain issues.  The patient denies any classic rest pain like symptoms.  The patient notes that the pain is worse when he walks upright however if he bends his back while walking the pain is improved.  The patient also notes that he has pain when lying in bed depending on his position.  Patient has some claudication-like symptoms but it is consistent.  Today noninvasive studies show an ABI of 1.10 on the right and 1.23 on the left.  The TBI is 0.98 bilaterally.  Previously the ABI was 1.00 bilaterally.  The patient has monophasic/triphasic waveforms in the right tibial arteries with biphasic waveforms in the left tibial arteries.  Patient has good toe waveforms bilaterally.   Review of Systems  Musculoskeletal: Positive for arthralgias and back pain.  All other systems reviewed and are negative.      Objective:   Physical Exam Vitals reviewed.  HENT:     Head: Normocephalic.  Cardiovascular:     Rate and Rhythm: Normal rate.     Pulses: Decreased pulses.  Pulmonary:     Effort: Pulmonary effort is normal.  Neurological:     Mental Status: He is alert and oriented to person, place, and time.  Psychiatric:        Mood and Affect: Mood normal.        Behavior: Behavior normal.        Thought Content: Thought content normal.        Judgment: Judgment normal.     BP 104/61 (BP Location: Right Arm)   Pulse 67   Resp 16   Wt 208 lb 12.8 oz (94.7 kg)   BMI 31.75 kg/m   Past Medical History:  Diagnosis Date  . Allergy    seasonal  . Arthritis    all over- in  general   . CAD (coronary artery disease)    a. inferior wall MI 10/01 s/p PCI/DES to RCA; b. Myoview 3/16 neg for ischemia; c. LHC 8/16: ostLAD 80%, OM1 70%, OM2 70% x 2 lesions, mRCA 30%, dRCA 70% s/p 4-V CABG 01/24/15 (LIMA-LAD, VG- OM1, VG-OM2, VG-PDA)   . Cancer (HCC)    skin, melanoma  . Carotid artery disease ([place])    a. [place] 8/16: 1-39% bilateral ICA stenosis  . Cataract    removed  . Diastolic dysfunction    a. TTE 8/16: EF 55-60%, no RWMA, Gr1DD, calcified mitral annulus, mild biatrial enlargement  . Erectile dysfunction   . GERD (gastroesophageal reflux disease)   . History of elbow surgery   . History of hiatal hernia   . HLD (hyperlipidemia)   . HTN (hypertension)   . Inferior myocardial infarction ([place]) 10/01   stent RCA  . Postoperative wound infection 02/02/2015  . Reflux esophagitis   . Sleep apnea 2017   CPAP at night    Social History   Socioeconomic History  . Marital status: Single    Spouse name: Not on file  . Number of children: Not on file  .  Years of education: 59  . Highest education level: Not on file  Occupational History  . Occupation: retired    Comment: Agricultural consultant  Tobacco Use  . Smoking status: Former Smoker    Packs/day: 2.50    Years: 40.00    Pack years: 100.00    Types: Cigarettes    Quit date: 03/24/2000    Years since quitting: 20.1  . Smokeless tobacco: Never Used  Vaping Use  . Vaping Use: Never used  Substance and Sexual Activity  . Alcohol use: Yes    Alcohol/week: 5.0 standard drinks    Types: 5 Shots of liquor per week    Comment: occ  . Drug use: No  . Sexual activity: Yes  Other Topics Concern  . Not on file  Social History Narrative   Singled; lives with son and dog    Occ: retired, back part time at [employer];    Activity: gym 4-5d/wk   Diet: good water, fruits/vegetables daily   Caffeine Use-yes   Social Determinants of Health   Financial Resource Strain: Low Risk   . Difficulty of Paying Living Expenses:  Not hard at all  Food Insecurity: No Food Insecurity  . Worried About Charity fundraiser in the Last Year: Never true  . Ran Out of Food in the Last Year: Never true  Transportation Needs: No Transportation Needs  . Lack of Transportation (Medical): No  . Lack of Transportation (Non-Medical): No  Physical Activity: Sufficiently Active  . Days of Exercise per Week: 7 days  . Minutes of Exercise per Session: 60 min  Stress: No Stress Concern Present  . Feeling of Stress : Not at all  Social Connections:   . Frequency of Communication with Friends and Family: Not on file  . Frequency of Social Gatherings with Friends and Family: Not on file  . Attends Religious Services: Not on file  . Active Member of Clubs or Organizations: Not on file  . Attends Archivist Meetings: Not on file  . Marital Status: Not on file  Intimate Partner Violence: Not At Risk  . Fear of Current or Ex-Partner: No  . Emotionally Abused: No  . Physically Abused: No  . Sexually Abused: No    Past Surgical History:  Procedure Laterality Date  . arm surgery  2010  . BROW LIFT Bilateral 11/25/2019   Procedure: BROW PTOSIS REPAIR BILATERAL;  Surgeon: Karle Starch, MD;  Location: [facility];  Service: Ophthalmology;  Laterality: Bilateral;  sleep apnea  . CARDIAC CATHETERIZATION  06/24/11  . CARDIAC CATHETERIZATION N/A 01/18/2015   Procedure: Left Heart Cath with coronary angiography;  Surgeon: Minna Merritts, MD;  Location: [facility];  Service: Cardiovascular;  Laterality: N/A;  . CARDIAC CATHETERIZATION N/A 01/18/2015   Procedure: Intravascular Pressure Wire/FFR Study;  Surgeon: [name], MD;  Location: [facility];  Service: Cardiovascular;  Laterality: N/A;  . CAROTID STENT  03/10/2011  . COLONOSCOPY  2010  . COLONOSCOPY  06/14/2014   Dr Hilarie Fredrickson  . CORONARY ARTERY BYPASS GRAFT N/A 01/24/2015   Procedure: CORONARY ARTERY BYPASS GRAFTING x 4 (LIMA-LAD, SVG-Int 1- Int  2, SVG-PD) ENDOSCOPIC GREATER SAPHENOUS VEIN HARVEST LEFT LEG;  Surgeon: [name], MD;  Location: [facility];  Service: Open Heart Surgery;  Laterality: N/A;  . EMBOLECTOMY  06/15/2019   Procedure: EMBOLECTOMY;  Surgeon: Katha Cabal, MD;  Location: [facility];  Service: Vascular;;  right superficial femoral artery  .  ENDARTERECTOMY FEMORAL Right 06/15/2019   Procedure: ENDARTERECTOMY FEMORAL;  Surgeon: Katha Cabal, MD;  Location: [facility];  Service: Vascular;  Laterality: Right;  common femoral profunda femoris superficial femoral  . ESOPHAGOGASTRODUODENOSCOPY (EGD) WITH PROPOFOL N/A 04/24/2016   Procedure: ESOPHAGOGASTRODUODENOSCOPY (EGD) WITH PROPOFOL;  Surgeon: Jerene Bears, MD;  Location: [facility];  Service: Gastroenterology;  Laterality: N/A;  . EYE SURGERY     lasik 15 yrs. ago, cataracts removed - both eyes   . HAMMER TOE SURGERY     right toe  . INSERTION OF ILIAC STENT Right 06/15/2019   Procedure: INSERTION OF ILIAC STENT ( STENTING OF SFA/POP ARTERY );  Surgeon: Katha Cabal, MD;  Location: [facility];  Service: Vascular;  Laterality: Right;  angioplpasty and stent placement: right superficial femoral right tibiopopliteal trunk bilateral common iliac arteries  . LEFT HEART CATH AND CORONARY ANGIOGRAPHY Left 06/10/2017   Procedure: LEFT HEART CATH AND CORONARY ANGIOGRAPHY;  Surgeon: Minna Merritts, MD;  Location: [facility];  Service: Cardiovascular;  Laterality: Left;  . LOWER EXTREMITY ANGIOGRAPHY Left 01/04/2019   Procedure: LOWER EXTREMITY ANGIOGRAPHY;  Surgeon: Katha Cabal, MD;  Location: [facility];  Service: Cardiovascular;  Laterality: Left;  . LOWER EXTREMITY ANGIOGRAPHY Right 01/25/2019   Procedure: LOWER EXTREMITY ANGIOGRAPHY;  Surgeon: Katha Cabal, MD;  Location: [facility];  Service: Cardiovascular;  Laterality: Right;  . NASAL SINUS SURGERY  2008   septpolasty, bilateral turbinate reduction  . SHOULDER  ARTHROSCOPY  2012  . TEE WITHOUT CARDIOVERSION N/A 01/24/2015   Procedure: TRANSESOPHAGEAL ECHOCARDIOGRAM (TEE);  Surgeon: [name], MD;  Location: [facility];  Service: Open Heart Surgery;  Laterality: N/A;  . [place]  . UPPER GI ENDOSCOPY  07/2014, 04-24-16   Dr Raquel James  . WRIST SURGERY  2011    Family History  Problem Relation Age of Onset  . Hypertension Mother   . Heart disease Mother   . Hypertension Father   . Diabetes Father   . Heart disease Brother 53  . Cancer Paternal Grandfather   . Colon cancer Neg Hx   . Prostate cancer Neg Hx   . Bladder Cancer Neg Hx   . Kidney cancer Neg Hx     Allergies  Allergen Reactions  . Flomax [Tamsulosin] Other (See Comments)    Orthostatic dizziness    CBC Latest Ref Rng & Units 03/15/2020 06/16/2019 04/12/2019  WBC 4.0 - 10.5 K/uL 7.0 13.2(H) 6.9  Hemoglobin 13.0 - 17.0 g/dL 14.7 13.9 15.5  Hematocrit 39 - 52 % 43.4 40.8 46.4  Platelets 150 - 400 K/uL 218.0 202 219      CMP     Component Value Date/Time   NA 140 03/15/2020 0804   NA 138 01/15/2015 0956   K 4.5 03/15/2020 0804   CL 105 03/15/2020 0804   CO2 28 03/15/2020 0804   GLUCOSE 90 03/15/2020 0804   BUN 24 (H) 03/15/2020 0804   BUN 16 01/15/2015 0956   CREATININE 1.14 03/15/2020 0804   CALCIUM 9.6 03/15/2020 0804   PROT 6.4 03/15/2020 0804   ALBUMIN 4.3 03/15/2020 0804   AST 11 03/15/2020 0804   ALT 14 03/15/2020 0804   ALKPHOS 51 03/15/2020 0804   BILITOT 0.6 03/15/2020 0804   GFRNONAA >60 06/16/2019 1126   GFRAA >60 06/16/2019 1126     No results found.     Assessment & Plan:   1. Right leg pain Based  upon the patient's description of pain and his noninvasive studies today, it is likely that the pain is related to his lower back.  The patient does have a history of lower back issues.  Patient is advised to follow-up with his neurosurgeon as he has recently had the MRI due to worsening pain.  2. Atherosclerosis of native artery of both  lower extremities with intermittent claudication (HCC)  Recommend:  The patient has evidence of atherosclerosis of the lower extremities with claudication.  The patient does not voice lifestyle limiting changes at this point in time.  Noninvasive studies do not suggest clinically significant change.  No invasive studies, angiography or surgery at this time The patient should continue walking and begin a more formal exercise program.  The patient should continue antiplatelet therapy and aggressive treatment of the lipid abnormalities  No changes in the patient's medications at this time  The patient should continue wearing graduated compression socks 10-15 mmHg strength to control the mild edema.    3. Essential hypertension Continue antihypertensive medications as already ordered, these medications have been reviewed and there are no changes at this time.   4. Mixed hyperlipidemia Continue statin as ordered and reviewed, no changes at this time    Current Outpatient Medications on File Prior to Visit  Medication Sig Dispense Refill  . acetaminophen (TYLENOL) 500 MG tablet Take 1,000 mg by mouth at bedtime.    . Ascorbic Acid (VITAMIN C) 1000 MG tablet Take 1,000 mg by mouth daily.    [name] aspirin 81 MG EC tablet Take 81 mg by mouth daily.      . Calcium-Magnesium-Vitamin D (CALCIUM 1200+D3 PO) Take 1 tablet by mouth daily.    . Cholecalciferol (VITAMIN D3) 50 MCG (2000 UT) TABS Take 2,000 Units by mouth daily.    [name] ibuprofen (ADVIL,MOTRIN) 200 MG tablet Take 400 mg by mouth every 6 (six) hours as needed for headache or moderate pain.     [name] lisinopril (ZESTRIL) 5 MG tablet TAKE 1 TABLET BY MOUTH DAILY 90 tablet 3  . metoprolol succinate (TOPROL-XL) 25 MG 24 hr tablet Take 0.5 tablets (12.5 mg total) by mouth daily. (Patient taking differently: Take 12.5 mg by mouth at bedtime. ) 45 tablet 3  . nitroGLYCERIN (NITROSTAT) 0.4 MG SL tablet Place 1 tablet (0.4 mg total) under the tongue  every 5 (five) minutes as needed for chest pain. 25 tablet 3  . NONFORMULARY OR COMPOUNDED ITEM Trimix (30/1/10)-(Pap/Phent/PGE)  Test Dose 3 57ml vials  Qty #3 Refills 0  [facility] [phone] Fax [phone] 3 each 0  . pantoprazole (PROTONIX) 40 MG tablet Take 1 tablet (40 mg total) by mouth daily. 90 tablet 3  . Polyethyl Glycol-Propyl Glycol (LUBRICANT EYE DROPS) 0.4-0.3 % SOLN Place 1-2 drops into both eyes 3 (three) times daily as needed (burning eyes.).    [name] simvastatin (ZOCOR) 40 MG tablet Take 1 tablet (40 mg total) by mouth at bedtime. 90 tablet 0  . vitamin B-12 (CYANOCOBALAMIN) 500 MCG tablet Take 500 mcg by mouth daily.    . vitamin E 1000 UNIT capsule Take 1,000 Units by mouth daily. Vitamin E 450 mg    . Zinc 50 MG TABS Take 50 mg by mouth daily.     No current facility-administered medications on file prior to visit.    There are no Patient Instructions on file for this visit. No follow-ups on file.   Kris Hartmann, NP

## 2020-05-14 DIAGNOSIS — M955 Acquired deformity of pelvis: Secondary | ICD-10-CM | POA: Diagnosis not present

## 2020-05-14 DIAGNOSIS — M5033 Other cervical disc degeneration, cervicothoracic region: Secondary | ICD-10-CM | POA: Diagnosis not present

## 2020-05-14 DIAGNOSIS — M9901 Segmental and somatic dysfunction of cervical region: Secondary | ICD-10-CM | POA: Diagnosis not present

## 2020-05-14 DIAGNOSIS — M9905 Segmental and somatic dysfunction of pelvic region: Secondary | ICD-10-CM | POA: Diagnosis not present

## 2020-05-17 DIAGNOSIS — M4726 Other spondylosis with radiculopathy, lumbar region: Secondary | ICD-10-CM | POA: Diagnosis not present

## 2020-05-21 ENCOUNTER — Other Ambulatory Visit: Payer: Self-pay | Admitting: Family Medicine

## 2020-05-21 DIAGNOSIS — M4726 Other spondylosis with radiculopathy, lumbar region: Secondary | ICD-10-CM | POA: Diagnosis not present

## 2020-05-24 DIAGNOSIS — M4726 Other spondylosis with radiculopathy, lumbar region: Secondary | ICD-10-CM | POA: Diagnosis not present

## 2020-05-25 DIAGNOSIS — M9901 Segmental and somatic dysfunction of cervical region: Secondary | ICD-10-CM | POA: Diagnosis not present

## 2020-05-25 DIAGNOSIS — M5033 Other cervical disc degeneration, cervicothoracic region: Secondary | ICD-10-CM | POA: Diagnosis not present

## 2020-05-25 DIAGNOSIS — M9905 Segmental and somatic dysfunction of pelvic region: Secondary | ICD-10-CM | POA: Diagnosis not present

## 2020-05-25 DIAGNOSIS — M955 Acquired deformity of pelvis: Secondary | ICD-10-CM | POA: Diagnosis not present

## 2020-05-28 DIAGNOSIS — M4726 Other spondylosis with radiculopathy, lumbar region: Secondary | ICD-10-CM | POA: Diagnosis not present

## 2020-05-30 DIAGNOSIS — M955 Acquired deformity of pelvis: Secondary | ICD-10-CM | POA: Diagnosis not present

## 2020-05-30 DIAGNOSIS — M9901 Segmental and somatic dysfunction of cervical region: Secondary | ICD-10-CM | POA: Diagnosis not present

## 2020-05-30 DIAGNOSIS — M9905 Segmental and somatic dysfunction of pelvic region: Secondary | ICD-10-CM | POA: Diagnosis not present

## 2020-05-30 DIAGNOSIS — M5033 Other cervical disc degeneration, cervicothoracic region: Secondary | ICD-10-CM | POA: Diagnosis not present

## 2020-06-05 DIAGNOSIS — M5416 Radiculopathy, lumbar region: Secondary | ICD-10-CM | POA: Diagnosis not present

## 2020-06-07 DIAGNOSIS — M955 Acquired deformity of pelvis: Secondary | ICD-10-CM | POA: Diagnosis not present

## 2020-06-07 DIAGNOSIS — M9901 Segmental and somatic dysfunction of cervical region: Secondary | ICD-10-CM | POA: Diagnosis not present

## 2020-06-07 DIAGNOSIS — M9905 Segmental and somatic dysfunction of pelvic region: Secondary | ICD-10-CM | POA: Diagnosis not present

## 2020-06-07 DIAGNOSIS — M5033 Other cervical disc degeneration, cervicothoracic region: Secondary | ICD-10-CM | POA: Diagnosis not present

## 2020-06-08 ENCOUNTER — Other Ambulatory Visit: Payer: Self-pay | Admitting: Family Medicine

## 2020-06-08 NOTE — Telephone Encounter (Signed)
ERx 

## 2020-06-11 DIAGNOSIS — M4726 Other spondylosis with radiculopathy, lumbar region: Secondary | ICD-10-CM | POA: Diagnosis not present

## 2020-06-13 DIAGNOSIS — M4726 Other spondylosis with radiculopathy, lumbar region: Secondary | ICD-10-CM | POA: Diagnosis not present

## 2020-06-15 DIAGNOSIS — M9905 Segmental and somatic dysfunction of pelvic region: Secondary | ICD-10-CM | POA: Diagnosis not present

## 2020-06-15 DIAGNOSIS — M955 Acquired deformity of pelvis: Secondary | ICD-10-CM | POA: Diagnosis not present

## 2020-06-15 DIAGNOSIS — M5033 Other cervical disc degeneration, cervicothoracic region: Secondary | ICD-10-CM | POA: Diagnosis not present

## 2020-06-15 DIAGNOSIS — M9901 Segmental and somatic dysfunction of cervical region: Secondary | ICD-10-CM | POA: Diagnosis not present

## 2020-06-18 DIAGNOSIS — M4726 Other spondylosis with radiculopathy, lumbar region: Secondary | ICD-10-CM | POA: Diagnosis not present

## 2020-06-21 DIAGNOSIS — M4726 Other spondylosis with radiculopathy, lumbar region: Secondary | ICD-10-CM | POA: Diagnosis not present

## 2020-06-27 ENCOUNTER — Telehealth: Payer: Self-pay

## 2020-06-27 ENCOUNTER — Encounter: Payer: Self-pay | Admitting: Family Medicine

## 2020-06-27 ENCOUNTER — Other Ambulatory Visit (INDEPENDENT_AMBULATORY_CARE_PROVIDER_SITE_OTHER): Payer: PPO | Admitting: Family Medicine

## 2020-06-27 ENCOUNTER — Telehealth (INDEPENDENT_AMBULATORY_CARE_PROVIDER_SITE_OTHER): Payer: PPO | Admitting: Family Medicine

## 2020-06-27 ENCOUNTER — Other Ambulatory Visit (INDEPENDENT_AMBULATORY_CARE_PROVIDER_SITE_OTHER): Payer: PPO

## 2020-06-27 VITALS — BP 137/64 | HR 65 | Wt 206.0 lb

## 2020-06-27 DIAGNOSIS — Z20822 Contact with and (suspected) exposure to covid-19: Secondary | ICD-10-CM | POA: Diagnosis not present

## 2020-06-27 DIAGNOSIS — J069 Acute upper respiratory infection, unspecified: Secondary | ICD-10-CM | POA: Diagnosis not present

## 2020-06-27 LAB — POCT INFLUENZA A/B
Influenza A, POC: NEGATIVE
Influenza B, POC: NEGATIVE

## 2020-06-27 NOTE — Telephone Encounter (Signed)
Pt said for few days had cold symptoms of H/A pain level 5; dry cough, slight SOB upon exertion and slight S/T; no fever or other covid symptoms. Pt scheduled video visit 06/27/20 at 12:40 with Dr Einar Pheasant. UC & ED precautions given and pt voiced understanding. Pt will have vitals ready when CMA calls.

## 2020-06-27 NOTE — Progress Notes (Signed)
Virtual Visit via Telephone Note  I connected with Randall Ali. on 06/27/20 at 12:40 PM EST by telephone and verified that I am speaking with the correct person using two identifiers.   I discussed the limitations, risks, security and privacy concerns of performing an evaluation and management service by telephone and the availability of in person appointments. I also discussed with the patient that there may be a patient responsible charge related to this service. The patient expressed understanding and agreed to proceed.  Patient location: Home Provider Location: [facility] Participants: Lesleigh Noe and Randall Ali.   History of Present Illness: Chief Complaint  Patient presents with  . Cough    Dry x 1 day  . Sore Throat    X 1 day  . Headache    Cough Episode onset: 06/26/2020. The cough is non-productive. Associated symptoms include headaches, myalgias, rhinorrhea and a sore throat. Pertinent negatives include no chills, fever, nasal congestion, postnasal drip, shortness of breath or wheezing. Treatments tried: mucinex. The treatment provided mild relief. There is no history of asthma or COPD.  Sore Throat  Associated symptoms include coughing and headaches. Pertinent negatives include no shortness of breath.  Headache  Associated symptoms include coughing, rhinorrhea and a sore throat. Pertinent negatives include no fever.   Vaccine x 3 No known sick contact No loss of taste or smell   Also got the flu shot  Daughter did have covid - but he never saw them while they were sick and this was 2 weeks ago  Review of Systems  Constitutional: Negative for chills and fever.  HENT: Positive for rhinorrhea and sore throat. Negative for postnasal drip.   Respiratory: Positive for cough. Negative for shortness of breath and wheezing.   Musculoskeletal: Positive for myalgias.  Neurological: Positive for headaches.      Observations/Objective: BP 137/64    Pulse 65   Wt 206 lb (93.4 kg)   BMI 31.32 kg/m   Phone visit:  Patient speaking in complete sentences No distress Alert and oriented Normal mood  Assessment and Plan: Problem List Items Addressed This Visit   None   Visit Diagnoses    Suspected COVID-19 virus infection    -  Primary   Viral URI with cough         Discussed OTC treatment for viral illness Patient will come today for drive-up testing Instructed to isolate until the results come back  Will also test for flu as body aches and <24 hours of illness. Will treat if positive     Follow Up Instructions:  Return if symptoms worsen or fail to improve.   I discussed the assessment and treatment plan with the patient. The patient was provided an opportunity to ask questions and all were answered. The patient agreed with the plan and demonstrated an understanding of the instructions.   The patient was advised to call back or seek an in-person evaluation if the symptoms worsen or if the condition fails to improve as anticipated.  I provided 8 minutes of non-face-to-face time during this encounter.    Lesleigh Noe, MD

## 2020-06-27 NOTE — Telephone Encounter (Signed)
Please see visit note

## 2020-06-28 ENCOUNTER — Telehealth: Payer: Self-pay

## 2020-06-28 NOTE — Telephone Encounter (Signed)
Vm left at triage.  Pt states he had a flu and COVID test done yesterday and was told he would be called within the hour with results of flu test.   Pt wants to know something before the weekend.  Plz advise at [phone].

## 2020-06-28 NOTE — Telephone Encounter (Signed)
Spoke to pt and notified him of his negative flu test. Pt anxious to get results of his Covid test. Informed pt that he will get a call when those results come back.

## 2020-06-29 ENCOUNTER — Other Ambulatory Visit: Payer: Self-pay | Admitting: Family Medicine

## 2020-06-29 DIAGNOSIS — U071 COVID-19: Secondary | ICD-10-CM

## 2020-06-29 DIAGNOSIS — G4733 Obstructive sleep apnea (adult) (pediatric): Secondary | ICD-10-CM | POA: Diagnosis not present

## 2020-06-29 LAB — NOVEL CORONAVIRUS, NAA: SARS-CoV-2, NAA: DETECTED — AB

## 2020-06-29 LAB — SARS-COV-2, NAA 2 DAY TAT

## 2020-06-29 LAB — SPECIMEN STATUS REPORT

## 2020-07-03 DIAGNOSIS — L570 Actinic keratosis: Secondary | ICD-10-CM | POA: Diagnosis not present

## 2020-07-03 DIAGNOSIS — D225 Melanocytic nevi of trunk: Secondary | ICD-10-CM | POA: Diagnosis not present

## 2020-07-03 DIAGNOSIS — X32XXXA Exposure to sunlight, initial encounter: Secondary | ICD-10-CM | POA: Diagnosis not present

## 2020-07-03 DIAGNOSIS — D0462 Carcinoma in situ of skin of left upper limb, including shoulder: Secondary | ICD-10-CM | POA: Diagnosis not present

## 2020-07-03 DIAGNOSIS — D2271 Melanocytic nevi of right lower limb, including hip: Secondary | ICD-10-CM | POA: Diagnosis not present

## 2020-07-03 DIAGNOSIS — C44729 Squamous cell carcinoma of skin of left lower limb, including hip: Secondary | ICD-10-CM | POA: Diagnosis not present

## 2020-07-03 DIAGNOSIS — C4401 Basal cell carcinoma of skin of lip: Secondary | ICD-10-CM | POA: Diagnosis not present

## 2020-07-03 DIAGNOSIS — D485 Neoplasm of uncertain behavior of skin: Secondary | ICD-10-CM | POA: Diagnosis not present

## 2020-07-03 DIAGNOSIS — Z85820 Personal history of malignant melanoma of skin: Secondary | ICD-10-CM | POA: Diagnosis not present

## 2020-07-03 DIAGNOSIS — D2261 Melanocytic nevi of right upper limb, including shoulder: Secondary | ICD-10-CM | POA: Diagnosis not present

## 2020-07-03 DIAGNOSIS — C44619 Basal cell carcinoma of skin of left upper limb, including shoulder: Secondary | ICD-10-CM | POA: Diagnosis not present

## 2020-07-03 DIAGNOSIS — D2262 Melanocytic nevi of left upper limb, including shoulder: Secondary | ICD-10-CM | POA: Diagnosis not present

## 2020-07-03 DIAGNOSIS — C44519 Basal cell carcinoma of skin of other part of trunk: Secondary | ICD-10-CM | POA: Diagnosis not present

## 2020-07-09 ENCOUNTER — Other Ambulatory Visit: Payer: Self-pay | Admitting: Family Medicine

## 2020-07-09 NOTE — Telephone Encounter (Signed)
ERx 

## 2020-07-26 DIAGNOSIS — M9905 Segmental and somatic dysfunction of pelvic region: Secondary | ICD-10-CM | POA: Diagnosis not present

## 2020-07-26 DIAGNOSIS — M5033 Other cervical disc degeneration, cervicothoracic region: Secondary | ICD-10-CM | POA: Diagnosis not present

## 2020-07-26 DIAGNOSIS — M9901 Segmental and somatic dysfunction of cervical region: Secondary | ICD-10-CM | POA: Diagnosis not present

## 2020-07-26 DIAGNOSIS — M955 Acquired deformity of pelvis: Secondary | ICD-10-CM | POA: Diagnosis not present

## 2020-07-31 DIAGNOSIS — G4733 Obstructive sleep apnea (adult) (pediatric): Secondary | ICD-10-CM | POA: Diagnosis not present

## 2020-08-03 DIAGNOSIS — M5033 Other cervical disc degeneration, cervicothoracic region: Secondary | ICD-10-CM | POA: Diagnosis not present

## 2020-08-03 DIAGNOSIS — M955 Acquired deformity of pelvis: Secondary | ICD-10-CM | POA: Diagnosis not present

## 2020-08-03 DIAGNOSIS — M9901 Segmental and somatic dysfunction of cervical region: Secondary | ICD-10-CM | POA: Diagnosis not present

## 2020-08-03 DIAGNOSIS — M9905 Segmental and somatic dysfunction of pelvic region: Secondary | ICD-10-CM | POA: Diagnosis not present

## 2020-08-06 ENCOUNTER — Other Ambulatory Visit: Payer: Self-pay | Admitting: Family Medicine

## 2020-08-06 NOTE — Telephone Encounter (Signed)
Refill request Tramadol Last refill 07/09/20 #60 Last office visiit video 06/27/20 Upcoming appointment 09/28/20

## 2020-08-08 DIAGNOSIS — M9905 Segmental and somatic dysfunction of pelvic region: Secondary | ICD-10-CM | POA: Diagnosis not present

## 2020-08-08 DIAGNOSIS — M955 Acquired deformity of pelvis: Secondary | ICD-10-CM | POA: Diagnosis not present

## 2020-08-08 DIAGNOSIS — M62830 Muscle spasm of back: Secondary | ICD-10-CM | POA: Diagnosis not present

## 2020-08-08 DIAGNOSIS — M9903 Segmental and somatic dysfunction of lumbar region: Secondary | ICD-10-CM | POA: Diagnosis not present

## 2020-08-08 NOTE — Telephone Encounter (Signed)
ERx 

## 2020-08-08 NOTE — Progress Notes (Signed)
MRN : [id number]  Randall Ali. is a 80 y.o. (May 29, 1941) male who presents with chief complaint of No chief complaint on file. [name]  History of Present Illness:   The patient returns to the office for followup and review of the noninvasive studies. There have been no interval changes in lower extremity symptoms. No interval shortening of the patient's claudication distance or development of rest pain symptoms. No new ulcers or wounds have occurred since the last visit.  There have been no significant changes to the patient's overall health care.  The patient denies amaurosis fugax or recent TIA symptoms. There are no recent neurological changes noted. The patient denies history of DVT, PE or superficial thrombophlebitis. The patient denies recent episodes of angina or shortness of breath.   ABI Rt=0.81 and Lt=0.93  (previous ABI's Rt=1.10 and Lt=1.23)  Duplex ultrasound of the right leg arterial system shows a moderate stenosis of the mid SFA, stent remains patent   No outpatient medications have been marked as taking for the 08/09/20 encounter (Appointment) with Delana Meyer, Dolores Lory, MD.    Past Medical History:  Diagnosis Date  . Allergy    seasonal  . Arthritis    all over- in general   . CAD (coronary artery disease)    a. inferior wall MI 10/01 s/p PCI/DES to RCA; b. Myoview 3/16 neg for ischemia; c. LHC 8/16: ostLAD 80%, OM1 70%, OM2 70% x 2 lesions, mRCA 30%, dRCA 70% s/p 4-V CABG 01/24/15 (LIMA-LAD, VG- OM1, VG-OM2, VG-PDA)   . Cancer (HCC)    skin, melanoma  . Carotid artery disease ([place])    a. [place] 8/16: 1-39% bilateral ICA stenosis  . Cataract    removed  . Diastolic dysfunction    a. TTE 8/16: EF 55-60%, no RWMA, Gr1DD, calcified mitral annulus, mild biatrial enlargement  . Erectile dysfunction   . GERD (gastroesophageal reflux disease)   . History of elbow surgery   . History of hiatal hernia   . HLD (hyperlipidemia)   . HTN (hypertension)   . Inferior  myocardial infarction ([place]) 10/01   stent RCA  . Postoperative wound infection 02/02/2015  . Reflux esophagitis   . Sleep apnea 2017   CPAP at night    Past Surgical History:  Procedure Laterality Date  . arm surgery  2010  . BROW LIFT Bilateral 11/25/2019   Procedure: BROW PTOSIS REPAIR BILATERAL;  Surgeon: Karle Starch, MD;  Location: [place];  Service: Ophthalmology;  Laterality: Bilateral;  sleep apnea  . CARDIAC CATHETERIZATION  06/24/11  . CARDIAC CATHETERIZATION N/A 01/18/2015   Procedure: Left Heart Cath with coronary angiography;  Surgeon: Minna Merritts, MD;  Location: [place] [facility];  Service: Cardiovascular;  Laterality: N/A;  . CARDIAC CATHETERIZATION N/A 01/18/2015   Procedure: Intravascular Pressure Wire/FFR Study;  Surgeon: [name], MD;  Location: [place] [facility];  Service: Cardiovascular;  Laterality: N/A;  . CAROTID STENT  03/10/2011  . COLONOSCOPY  2010  . COLONOSCOPY  06/14/2014   Dr Hilarie Fredrickson  . CORONARY ARTERY BYPASS GRAFT N/A 01/24/2015   Procedure: CORONARY ARTERY BYPASS GRAFTING x 4 (LIMA-LAD, SVG-Int 1- Int 2, SVG-PD) ENDOSCOPIC GREATER SAPHENOUS VEIN HARVEST LEFT LEG;  Surgeon: [name], MD;  Location: [facility];  Service: Open Heart Surgery;  Laterality: N/A;  . EMBOLECTOMY  06/15/2019   Procedure: EMBOLECTOMY;  Surgeon: Katha Cabal, MD;  Location: [facility];  Service: Vascular;;  right superficial femoral artery  .  ENDARTERECTOMY FEMORAL Right 06/15/2019   Procedure: ENDARTERECTOMY FEMORAL;  Surgeon: Katha Cabal, MD;  Location: [facility];  Service: Vascular;  Laterality: Right;  common femoral profunda femoris superficial femoral  . ESOPHAGOGASTRODUODENOSCOPY (EGD) WITH PROPOFOL N/A 04/24/2016   Procedure: ESOPHAGOGASTRODUODENOSCOPY (EGD) WITH PROPOFOL;  Surgeon: Jerene Bears, MD;  Location: [facility];  Service: Gastroenterology;  Laterality: N/A;  . EYE SURGERY     lasik 15 yrs. ago, cataracts removed - both eyes    . HAMMER TOE SURGERY     right toe  . INSERTION OF ILIAC STENT Right 06/15/2019   Procedure: INSERTION OF ILIAC STENT ( STENTING OF SFA/POP ARTERY );  Surgeon: Katha Cabal, MD;  Location: [facility];  Service: Vascular;  Laterality: Right;  angioplpasty and stent placement: right superficial femoral right tibiopopliteal trunk bilateral common iliac arteries  . LEFT HEART CATH AND CORONARY ANGIOGRAPHY Left 06/10/2017   Procedure: LEFT HEART CATH AND CORONARY ANGIOGRAPHY;  Surgeon: Minna Merritts, MD;  Location: [place] [facility];  Service: Cardiovascular;  Laterality: Left;  . LOWER EXTREMITY ANGIOGRAPHY Left 01/04/2019   Procedure: LOWER EXTREMITY ANGIOGRAPHY;  Surgeon: Katha Cabal, MD;  Location: [place] [facility];  Service: Cardiovascular;  Laterality: Left;  . LOWER EXTREMITY ANGIOGRAPHY Right 01/25/2019   Procedure: LOWER EXTREMITY ANGIOGRAPHY;  Surgeon: Katha Cabal, MD;  Location: [place] [facility];  Service: Cardiovascular;  Laterality: Right;  . NASAL SINUS SURGERY  2008   septpolasty, bilateral turbinate reduction  . SHOULDER ARTHROSCOPY  2012  . TEE WITHOUT CARDIOVERSION N/A 01/24/2015   Procedure: TRANSESOPHAGEAL ECHOCARDIOGRAM (TEE);  Surgeon: [name], MD;  Location: [place];  Service: Open Heart Surgery;  Laterality: N/A;  . [place]  . UPPER GI ENDOSCOPY  07/2014, 04-24-16   Dr Raquel James  . WRIST SURGERY  2011    Social History Social History   Tobacco Use  . Smoking status: Former Smoker    Packs/day: 2.50    Years: 40.00    Pack years: 100.00    Types: Cigarettes    Quit date: 03/24/2000    Years since quitting: 20.3  . Smokeless tobacco: Never Used  Vaping Use  . Vaping Use: Never used  Substance Use Topics  . Alcohol use: Yes    Alcohol/week: 5.0 standard drinks    Types: 5 Shots of liquor per week    Comment: occ  . Drug use: No    Family History Family History  Problem Relation Age of Onset  . Hypertension  Mother   . Heart disease Mother   . Hypertension Father   . Diabetes Father   . Heart disease Brother 58  . Cancer Paternal Grandfather   . Colon cancer Neg Hx   . Prostate cancer Neg Hx   . Bladder Cancer Neg Hx   . Kidney cancer Neg Hx     Allergies  Allergen Reactions  . Flomax [Tamsulosin] Other (See Comments)    Orthostatic dizziness     REVIEW OF SYSTEMS (Negative unless checked)  Constitutional: [] Weight loss  [] Fever  [] Chills Cardiac: [] Chest pain   [] Chest pressure   [] Palpitations   [] Shortness of breath when laying flat   [] Shortness of breath with exertion. Vascular:  [x] Pain in legs with walking   [] Pain in legs at rest  [] History of DVT   [] Phlebitis   [] Swelling in legs   [] Varicose veins   [] Non-healing ulcers Pulmonary:   [] Uses home oxygen   [] Productive  cough   [] Hemoptysis   [] Wheeze  [] COPD   [] Asthma Neurologic:  [] Dizziness   [] Seizures   [] History of stroke   [] History of TIA  [] Aphasia   [] Vissual changes   [] Weakness or numbness in arm   [] Weakness or numbness in leg Musculoskeletal:   [] Joint swelling   [] Joint pain   [] Low back pain Hematologic:  [] Easy bruising  [] Easy bleeding   [] Hypercoagulable state   [] Anemic Gastrointestinal:  [] Diarrhea   [] Vomiting  [] Gastroesophageal reflux/heartburn   [] Difficulty swallowing. Genitourinary:  [] Chronic kidney disease   [] Difficult urination  [] Frequent urination   [] Blood in urine Skin:  [] Rashes   [] Ulcers  Psychological:  [] History of anxiety   []  History of major depression.  Physical Examination  There were no vitals filed for this visit. There is no height or weight on file to calculate BMI. Gen: WD/WN, NAD Head: [place]/AT, No temporalis wasting.  Ear/Nose/Throat: Hearing grossly intact, nares w/o erythema or drainage Eyes: PER, EOMI, sclera nonicteric.  Neck: Supple, no large masses.   Pulmonary:  Good air movement, no audible wheezing bilaterally, no use of accessory muscles.  Cardiac: RRR, no  JVD Vascular:  Vessel Right Left  Radial Palpable Palpable  PT Not Palpable Trace Palpable  DP Not Palpable Not Palpable  Gastrointestinal: Non-distended. No guarding/no peritoneal signs.  Musculoskeletal: M/S 5/5 throughout.  No deformity or atrophy.  Neurologic: CN 2-12 intact. Symmetrical.  Speech is fluent. Motor exam as listed above. Psychiatric: Judgment intact, Mood & affect appropriate for pt's clinical situation. Dermatologic: No rashes or ulcers noted.  No changes consistent with cellulitis.  CBC Lab Results  Component Value Date   WBC 7.0 03/15/2020   HGB 14.7 03/15/2020   HCT 43.4 03/15/2020   MCV 92.1 03/15/2020   PLT 218.0 03/15/2020    BMET    Component Value Date/Time   NA 140 03/15/2020 0804   NA 138 01/15/2015 0956   K 4.5 03/15/2020 0804   CL 105 03/15/2020 0804   CO2 28 03/15/2020 0804   GLUCOSE 90 03/15/2020 0804   BUN 24 (H) 03/15/2020 0804   BUN 16 01/15/2015 0956   CREATININE 1.14 03/15/2020 0804   CALCIUM 9.6 03/15/2020 0804   GFRNONAA >60 06/16/2019 1126   GFRAA >60 06/16/2019 1126   CrCl cannot be calculated (Patient's most recent lab result is older than the maximum 21 days allowed.).  COAG Lab Results  Component Value Date   INR 1.1 04/12/2019   INR 0.94 06/04/2017   INR 1.34 01/24/2015    Radiology No results found.  Assessment/Plan 1. Atherosclerosis of native artery of both lower extremities with intermittent claudication (HCC)  Recommend:  The patient has evidence of atherosclerosis of the lower extremities with claudication.  The patient does not voice lifestyle limiting changes at this point in time.  However, there is a moderate restenosis in the mid SFA.  Noninvasive studies suggest a moderate change.  No invasive studies, angiography or surgery at this time The patient should continue walking and begin a more formal exercise program.  The patient should continue antiplatelet therapy and aggressive treatment of the  lipid abnormalities  No changes in the patient's medications at this time  - VAS [place] ABI WITH/WO TBI; Future - VAS [place] LOWER EXTREMITY ARTERIAL DUPLEX; Future  2. Carotid stenosis, asymptomatic, bilateral Recommend:  Given the patient's asymptomatic subcritical stenosis no further invasive testing or surgery at this time.  Continue antiplatelet therapy as prescribed Continue management of CAD, HTN and  Hyperlipidemia Healthy heart diet,  encouraged exercise at least 4 times per week Follow up in 12 months with duplex ultrasound and physical exam   3. Atherosclerosis of native coronary artery of native heart without angina pectoris Continue cardiac and antihypertensive medications as already ordered and reviewed, no changes at this time.  Continue statin as ordered and reviewed, no changes at this time  Nitrates PRN for chest pain   4. Primary hypertension Continue antihypertensive medications as already ordered, these medications have been reviewed and there are no changes at this time.   5. Postoperative atrial fibrillation (HCC) Continue antiarrhythmia medications as already ordered, these medications have been reviewed and there are no changes at this time.  Continue anticoagulation as ordered by Cardiology Service   6. Mixed hyperlipidemia Continue statin as ordered and reviewed, no changes at this time     Hortencia Pilar, MD  08/08/2020 8:50 PM

## 2020-08-09 ENCOUNTER — Ambulatory Visit (INDEPENDENT_AMBULATORY_CARE_PROVIDER_SITE_OTHER): Payer: PPO

## 2020-08-09 ENCOUNTER — Other Ambulatory Visit: Payer: Self-pay

## 2020-08-09 ENCOUNTER — Ambulatory Visit (INDEPENDENT_AMBULATORY_CARE_PROVIDER_SITE_OTHER): Payer: PPO | Admitting: Vascular Surgery

## 2020-08-09 ENCOUNTER — Encounter (INDEPENDENT_AMBULATORY_CARE_PROVIDER_SITE_OTHER): Payer: Self-pay | Admitting: Vascular Surgery

## 2020-08-09 VITALS — BP 126/71 | HR 70 | Ht 68.0 in | Wt 207.0 lb

## 2020-08-09 DIAGNOSIS — I6523 Occlusion and stenosis of bilateral carotid arteries: Secondary | ICD-10-CM

## 2020-08-09 DIAGNOSIS — I2510 Atherosclerotic heart disease of native coronary artery without angina pectoris: Secondary | ICD-10-CM

## 2020-08-09 DIAGNOSIS — I9789 Other postprocedural complications and disorders of the circulatory system, not elsewhere classified: Secondary | ICD-10-CM | POA: Diagnosis not present

## 2020-08-09 DIAGNOSIS — I70213 Atherosclerosis of native arteries of extremities with intermittent claudication, bilateral legs: Secondary | ICD-10-CM | POA: Diagnosis not present

## 2020-08-09 DIAGNOSIS — I4891 Unspecified atrial fibrillation: Secondary | ICD-10-CM | POA: Diagnosis not present

## 2020-08-09 DIAGNOSIS — E782 Mixed hyperlipidemia: Secondary | ICD-10-CM

## 2020-08-09 DIAGNOSIS — I10 Essential (primary) hypertension: Secondary | ICD-10-CM | POA: Diagnosis not present

## 2020-08-10 DIAGNOSIS — H0100A Unspecified blepharitis right eye, upper and lower eyelids: Secondary | ICD-10-CM | POA: Diagnosis not present

## 2020-08-10 DIAGNOSIS — M62830 Muscle spasm of back: Secondary | ICD-10-CM | POA: Diagnosis not present

## 2020-08-10 DIAGNOSIS — M955 Acquired deformity of pelvis: Secondary | ICD-10-CM | POA: Diagnosis not present

## 2020-08-10 DIAGNOSIS — M9903 Segmental and somatic dysfunction of lumbar region: Secondary | ICD-10-CM | POA: Diagnosis not present

## 2020-08-10 DIAGNOSIS — M9905 Segmental and somatic dysfunction of pelvic region: Secondary | ICD-10-CM | POA: Diagnosis not present

## 2020-08-13 DIAGNOSIS — D2372 Other benign neoplasm of skin of left lower limb, including hip: Secondary | ICD-10-CM | POA: Diagnosis not present

## 2020-08-13 DIAGNOSIS — C44729 Squamous cell carcinoma of skin of left lower limb, including hip: Secondary | ICD-10-CM | POA: Diagnosis not present

## 2020-08-17 DIAGNOSIS — M9905 Segmental and somatic dysfunction of pelvic region: Secondary | ICD-10-CM | POA: Diagnosis not present

## 2020-08-17 DIAGNOSIS — M9903 Segmental and somatic dysfunction of lumbar region: Secondary | ICD-10-CM | POA: Diagnosis not present

## 2020-08-17 DIAGNOSIS — M955 Acquired deformity of pelvis: Secondary | ICD-10-CM | POA: Diagnosis not present

## 2020-08-17 DIAGNOSIS — M62830 Muscle spasm of back: Secondary | ICD-10-CM | POA: Diagnosis not present

## 2020-08-25 DIAGNOSIS — M62830 Muscle spasm of back: Secondary | ICD-10-CM | POA: Diagnosis not present

## 2020-08-25 DIAGNOSIS — M955 Acquired deformity of pelvis: Secondary | ICD-10-CM | POA: Diagnosis not present

## 2020-08-25 DIAGNOSIS — M9903 Segmental and somatic dysfunction of lumbar region: Secondary | ICD-10-CM | POA: Diagnosis not present

## 2020-08-25 DIAGNOSIS — M9905 Segmental and somatic dysfunction of pelvic region: Secondary | ICD-10-CM | POA: Diagnosis not present

## 2020-08-27 DIAGNOSIS — D0462 Carcinoma in situ of skin of left upper limb, including shoulder: Secondary | ICD-10-CM | POA: Diagnosis not present

## 2020-08-29 ENCOUNTER — Telehealth: Payer: Self-pay

## 2020-08-29 DIAGNOSIS — Z8679 Personal history of other diseases of the circulatory system: Secondary | ICD-10-CM | POA: Diagnosis not present

## 2020-08-29 DIAGNOSIS — R6883 Chills (without fever): Secondary | ICD-10-CM | POA: Diagnosis not present

## 2020-08-29 DIAGNOSIS — J069 Acute upper respiratory infection, unspecified: Secondary | ICD-10-CM | POA: Diagnosis not present

## 2020-08-29 DIAGNOSIS — Z20822 Contact with and (suspected) exposure to covid-19: Secondary | ICD-10-CM | POA: Diagnosis not present

## 2020-08-29 NOTE — Telephone Encounter (Signed)
Pt symptoms started on 08/28/20. having h/a pain level is 5 - 6 . Head congestion and bad dry cough for 2 days with fever of 100.1. pt has taken ibuprofen. no more SOB than normal. Pt had chills last night and aches all over. Runny nose also. 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 Pt has not been covid tested. Pt said he feels really bad this morning. Pt will go to [facility] in [place] for eval and any needed testing. Pt will cb with update later this week. Sending note to [name] as Juluis Rainier.

## 2020-09-03 DIAGNOSIS — R0602 Shortness of breath: Secondary | ICD-10-CM | POA: Diagnosis not present

## 2020-09-03 DIAGNOSIS — J40 Bronchitis, not specified as acute or chronic: Secondary | ICD-10-CM | POA: Diagnosis not present

## 2020-09-03 DIAGNOSIS — Z87891 Personal history of nicotine dependence: Secondary | ICD-10-CM | POA: Diagnosis not present

## 2020-09-03 NOTE — Telephone Encounter (Signed)
Called and spoke to patient and was advised that he went to [facility]. Patient stated that they checked him for Covid and the flu and both were negative. Patient stated that they told him that he had a virus/cold. Patient stated that he was given a pill for cough which has not helped much. Patient stated that he has been taking Mucinex which his cold started breaking up Saturday. Patient stated that he is coughing up green mucus since Saturday. Patient denies a fever. Patient stated that his breathing has gotten better but not yet back to normal. Patient was advised if he does not continue to improve to call the office back. Patient was advised that Dr. Danise Mina will get this message for him to review.

## 2020-09-03 NOTE — Telephone Encounter (Signed)
plz touch base with patient on symptoms from last week.

## 2020-09-04 NOTE — Telephone Encounter (Addendum)
Spoke with pt relaying Dr. Synthia Innocent message.  Pt verbalizes understanding and declines visit (OV or virtual) at this time.  Says he will call back if he gets worse.

## 2020-09-04 NOTE — Telephone Encounter (Addendum)
Glad he's noticing some improvement. Agree - let us know if not continuing to improve as expected for further evaluation. If desires to be seen in office would be 4/1 PM at end of day, otherwise could do virtual visit anytime if needed.

## 2020-09-05 DIAGNOSIS — M25561 Pain in right knee: Secondary | ICD-10-CM | POA: Diagnosis not present

## 2020-09-07 ENCOUNTER — Telehealth: Payer: Self-pay | Admitting: Internal Medicine

## 2020-09-07 NOTE — Telephone Encounter (Signed)
Cologuard unless patient prefers not to consider repeat colonoscopy

## 2020-09-07 NOTE — Telephone Encounter (Signed)
Last colonoscopy report stated due to poor prep to consider cologuard in 5 years. Please advise if ok to order cologuard or if pt need an ov prior.

## 2020-09-10 ENCOUNTER — Other Ambulatory Visit: Payer: Self-pay | Admitting: Family Medicine

## 2020-09-10 DIAGNOSIS — C44519 Basal cell carcinoma of skin of other part of trunk: Secondary | ICD-10-CM | POA: Diagnosis not present

## 2020-09-10 DIAGNOSIS — C44619 Basal cell carcinoma of skin of left upper limb, including shoulder: Secondary | ICD-10-CM | POA: Diagnosis not present

## 2020-09-10 NOTE — Telephone Encounter (Signed)
Spoke with pt and he is aware. Pt states he would prefer to wait until his colon recall date.

## 2020-09-10 NOTE — Telephone Encounter (Signed)
ERx 

## 2020-09-19 DIAGNOSIS — M62830 Muscle spasm of back: Secondary | ICD-10-CM | POA: Diagnosis not present

## 2020-09-19 DIAGNOSIS — M955 Acquired deformity of pelvis: Secondary | ICD-10-CM | POA: Diagnosis not present

## 2020-09-19 DIAGNOSIS — M9903 Segmental and somatic dysfunction of lumbar region: Secondary | ICD-10-CM | POA: Diagnosis not present

## 2020-09-19 DIAGNOSIS — M9905 Segmental and somatic dysfunction of pelvic region: Secondary | ICD-10-CM | POA: Diagnosis not present

## 2020-09-25 DIAGNOSIS — M9905 Segmental and somatic dysfunction of pelvic region: Secondary | ICD-10-CM | POA: Diagnosis not present

## 2020-09-25 DIAGNOSIS — M955 Acquired deformity of pelvis: Secondary | ICD-10-CM | POA: Diagnosis not present

## 2020-09-25 DIAGNOSIS — M62830 Muscle spasm of back: Secondary | ICD-10-CM | POA: Diagnosis not present

## 2020-09-25 DIAGNOSIS — M9903 Segmental and somatic dysfunction of lumbar region: Secondary | ICD-10-CM | POA: Diagnosis not present

## 2020-09-28 ENCOUNTER — Ambulatory Visit: Payer: PPO | Admitting: Family Medicine

## 2020-10-02 ENCOUNTER — Other Ambulatory Visit: Payer: Self-pay

## 2020-10-02 ENCOUNTER — Ambulatory Visit (INDEPENDENT_AMBULATORY_CARE_PROVIDER_SITE_OTHER)
Admission: RE | Admit: 2020-10-02 | Discharge: 2020-10-02 | Disposition: A | Discharge status: Home / Self Care | Payer: PPO | Source: Ambulatory Visit | Attending: Family Medicine | Admitting: Family Medicine

## 2020-10-02 ENCOUNTER — Ambulatory Visit (INDEPENDENT_AMBULATORY_CARE_PROVIDER_SITE_OTHER): Payer: PPO | Admitting: Family Medicine

## 2020-10-02 ENCOUNTER — Encounter: Payer: Self-pay | Admitting: Family Medicine

## 2020-10-02 VITALS — BP 134/62 | HR 70 | Temp 97.9°F | Ht 68.0 in | Wt 206.2 lb

## 2020-10-02 DIAGNOSIS — M5442 Lumbago with sciatica, left side: Secondary | ICD-10-CM

## 2020-10-02 DIAGNOSIS — G8929 Other chronic pain: Secondary | ICD-10-CM | POA: Diagnosis not present

## 2020-10-02 DIAGNOSIS — M47816 Spondylosis without myelopathy or radiculopathy, lumbar region: Secondary | ICD-10-CM

## 2020-10-02 DIAGNOSIS — I70213 Atherosclerosis of native arteries of extremities with intermittent claudication, bilateral legs: Secondary | ICD-10-CM

## 2020-10-02 DIAGNOSIS — R9389 Abnormal findings on diagnostic imaging of other specified body structures: Secondary | ICD-10-CM | POA: Diagnosis not present

## 2020-10-02 DIAGNOSIS — M6208 Separation of muscle (nontraumatic), other site: Secondary | ICD-10-CM | POA: Diagnosis not present

## 2020-10-02 NOTE — Assessment & Plan Note (Signed)
Noted at recent [facility] visit however I don't have records - they will be requested. Check CXR today 1 month after prior.

## 2020-10-02 NOTE — Assessment & Plan Note (Signed)
He has scheduled appt to see gen surgeon with [facility] for further evaluation given ongoing postprandial dyspnea

## 2020-10-02 NOTE — Assessment & Plan Note (Addendum)
Appreciate [facility] care - upcoming appt next month.

## 2020-10-02 NOTE — Progress Notes (Signed)
Patient ID: Randall Hollars., male    DOB: 11-Aug-1940, 80 y.o.   MRN: [id number]  This visit was conducted in person.  BP 134/62   Pulse 70   Temp 97.9 F (36.6 C) (Temporal)   Ht 5\' 8"  (1.727 m)   Wt 206 lb 3 oz (93.5 kg)   SpO2 96%   BMI 31.35 kg/m    CC: 6 mo f/u visit  Subjective:   HPI: Randall Callow. is a 80 y.o. male presenting on 10/02/2020 for Follow-up (Here for 6 mo f/u.  Also, pt mentioned Randall Ali needs a f/u CXR for a spot that was seen on his lung about 1 mo ago. )   COVID positive 06/2020. Fully recovered.  Recent URI tested negative for flu and COVID. Seen at [facility]. States they did an xray which showed a spot in the lung - told Randall Ali needed a repeat CXR today. I don't have records of this.   Sees [facility] for known PAD, carotid stenosis, CAD. Upcoming appt next month.   Saw neurosurgery Dr Lacinda Axon - MRI lumbar spine showing severe DDD throughout spine, autofusion at L2/3 with mild central stenosis L2-L5 and R lateral recess stenosis due to facet hypertrophy at L5/S1.   Notes ongoing leg weakness with walking ie 100 yards (bilateral lateral hips hurt, legs feel weak), as well as back pain worse in the mornings. Notes R calf pain with walking. Notes pain with walking but no pain when running (continues playing softball).      Relevant past medical, surgical, family and social history reviewed and updated as indicated. Interim medical history since our last visit reviewed. Allergies and medications reviewed and updated. Outpatient Medications Prior to Visit  Medication Sig Dispense Refill  . acetaminophen (TYLENOL) 500 MG tablet Take 1,000 mg by mouth at bedtime.    . Ascorbic Acid (VITAMIN C) 1000 MG tablet Take 1,000 mg by mouth daily.    [name] aspirin 81 MG EC tablet Take 81 mg by mouth daily.      . Calcium-Magnesium-Vitamin D (CALCIUM 1200+D3 PO) Take 1 tablet by mouth daily.    . Cholecalciferol (VITAMIN D3) 50 MCG (2000 UT) TABS Take 2,000 Units by mouth daily.    [name]  ibuprofen (ADVIL,MOTRIN) 200 MG tablet Take 400 mg by mouth every 6 (six) hours as needed for headache or moderate pain.     [name] ketorolac (TORADOL) 30 MG/ML injection 1 CC IM INJECTION LT GLUTEUS LOT [id number] EXP 03/23    . lisinopril (ZESTRIL) 5 MG tablet TAKE 1 TABLET BY MOUTH DAILY 90 tablet 3  . methylPREDNISolone acetate (DEPO-MEDROL) 40 MG/ML injection 2 CC IM INJECTION TO THE RIGHT GLUTEUS LOT [id number] EXP 09/22    . metoprolol succinate (TOPROL-XL) 25 MG 24 hr tablet TAKE 1/2 TABLET (12.5 MG) BY MOUTH DAILY 45 tablet 3  . nitroGLYCERIN (NITROSTAT) 0.4 MG SL tablet Place 1 tablet (0.4 mg total) under the tongue every 5 (five) minutes as needed for chest pain. 25 tablet 3  . NONFORMULARY OR COMPOUNDED ITEM Trimix (30/1/10)-(Pap/Phent/PGE)  Test Dose 3 34ml vials  Qty #3 Refills 0  [place] [phone] Fax [phone] 3 each 0  . pantoprazole (PROTONIX) 40 MG tablet Take 1 tablet (40 mg total) by mouth daily. 90 tablet 3  . Phenylephrine-DM-GG-APAP (MUCINEX FAST-MAX CLD FLU THRT PO) Take by mouth as needed.    Vladimir Faster Glycol-Propyl Glycol (LUBRICANT EYE DROPS) 0.4-0.3 % SOLN Place 1-2 drops into both eyes  3 (three) times daily as needed (burning eyes.).    [name] simvastatin (ZOCOR) 40 MG tablet Take 1 tablet (40 mg total) by mouth at bedtime. 90 tablet 0  . traMADol (ULTRAM) 50 MG tablet TAKE 2 TABLETS BY MOUTH DAILY AS NEEDED 60 tablet 0  . vitamin B-12 (CYANOCOBALAMIN) 500 MCG tablet Take 500 mcg by mouth daily.    . vitamin E 1000 UNIT capsule Take 1,000 Units by mouth daily. Vitamin E 450 mg    . Zinc 50 MG TABS Take 50 mg by mouth daily.    . imiquimod (ALDARA) 5 % cream Apply topically.     No facility-administered medications prior to visit.     Per HPI unless specifically indicated in ROS section below Review of Systems Objective:  BP 134/62   Pulse 70   Temp 97.9 F (36.6 C) (Temporal)   Ht 5\' 8"  (1.727 m)   Wt 206 lb 3 oz (93.5 kg)   SpO2 96%   BMI  31.35 kg/m   Wt Readings from Last 3 Encounters:  10/02/20 206 lb 3 oz (93.5 kg)  08/09/20 207 lb (93.9 kg)  06/27/20 206 lb (93.4 kg)      Physical Exam Vitals and nursing note reviewed.  Constitutional:      Appearance: Normal appearance. Randall Ali is not ill-appearing.  Cardiovascular:     Rate and Rhythm: Normal rate and regular rhythm.     Pulses: Normal pulses.     Heart sounds: Normal heart sounds. No murmur heard.   Pulmonary:     Effort: Pulmonary effort is normal. No respiratory distress.     Breath sounds: Normal breath sounds. No wheezing, rhonchi or rales.  Abdominal:     General: Bowel sounds are normal. There is no distension.     Palpations: Abdomen is soft. There is no mass.     Tenderness: There is no abdominal tenderness. There is no guarding or rebound. Negative signs include Murphy's sign.     Comments: Ventral bulge without significant herniation  Musculoskeletal:     Right lower leg: No edema.     Left lower leg: No edema.     Comments:  No pain with int/ext rotation at hip No pain at SIJ, GTB or sciatic notch bilaterally  Skin:    General: Skin is warm and dry.     Findings: No rash.  Neurological:     Mental Status: Randall Ali is alert.  Psychiatric:        Mood and Affect: Mood normal.        Behavior: Behavior normal.        Assessment & Plan:  This visit occurred during the SARS-CoV-2 public health emergency.  Safety protocols were in place, including screening questions prior to the visit, additional usage of staff PPE, and extensive cleaning of exam room while observing appropriate contact time as indicated for disinfecting solutions.   Problem List Items Addressed This Visit    Diastasis recti    Randall Ali has scheduled appt to see gen surgeon with [facility] for further evaluation given ongoing postprandial dyspnea      Atherosclerosis of native arteries of extremity with intermittent claudication ([place])    Appreciate [facility] care - upcoming appt next  month.       Chronic back pain    S/p evaluation by neurosurgery - rec against surgical intervention.      Lumbar spondylosis   Abnormal chest x-ray - Primary    Noted at recent [facility]  visit however I don't have records - they will be requested. Check CXR today 1 month after prior.       Relevant Orders   DG Chest 2 View       No orders of the defined types were placed in this encounter.  Orders Placed This Encounter  Procedures  . DG Chest 2 View    Standing Status:   Future    Standing Expiration Date:   10/02/2021    Order Specific Question:   Reason for Exam (SYMPTOM  OR DIAGNOSIS REQUIRED)    Answer:   f/u abnormal CXR    Order Specific Question:   Preferred imaging location?    Answer:   [facility]    Patient Instructions  Chest xray today We will request records from [facility] urgent care for xray at that time. Call to schedule follow up with Dr [name].  Good to see you today. Return as needed or in 6 months for physical.   Follow up plan: Return in about 6 months (around 04/03/2021) for annual exam, prior fasting for blood work, medicare wellness visit.  Ria Bush, MD

## 2020-10-02 NOTE — Assessment & Plan Note (Signed)
S/p evaluation by neurosurgery - rec against surgical intervention.

## 2020-10-02 NOTE — Patient Instructions (Addendum)
Chest xray today We will request records from [facility] urgent care for xray at that time. Call to schedule follow up with Dr [name].  Good to see you today. Return as needed or in 6 months for physical.

## 2020-10-03 ENCOUNTER — Telehealth: Payer: Self-pay

## 2020-10-03 DIAGNOSIS — R9389 Abnormal findings on diagnostic imaging of other specified body structures: Secondary | ICD-10-CM

## 2020-10-03 NOTE — Addendum Note (Signed)
Addended by: Ria Bush on: 10/03/2020 05:40 PM   Modules accepted: Orders

## 2020-10-03 NOTE — Telephone Encounter (Addendum)
Called several times, unable to reach patient/     Finally reached patient.   Xray abnormal, radiologist recommends CT chest with contrast for further evaluation of possible mass at left lung.  I've ordered this.   Pt currently in [place], will be back on Friday.

## 2020-10-03 NOTE — Telephone Encounter (Signed)
Randall Ali from [facility] called to report finding of chest x-ray completed on 10/02/2020. Reports showed a mass-like opacity concerning for malignancy. Recommendations were CT chest with IV contrast. Please advise.

## 2020-10-04 NOTE — Telephone Encounter (Signed)
Noted  

## 2020-10-08 ENCOUNTER — Other Ambulatory Visit: Payer: Self-pay | Admitting: Family Medicine

## 2020-10-08 ENCOUNTER — Ambulatory Visit
Admission: RE | Admit: 2020-10-08 | Discharge: 2020-10-08 | Disposition: A | Discharge status: Home / Self Care | Payer: PPO | Source: Ambulatory Visit | Attending: Family Medicine | Admitting: Family Medicine

## 2020-10-08 ENCOUNTER — Other Ambulatory Visit: Payer: Self-pay

## 2020-10-08 ENCOUNTER — Telehealth: Payer: Self-pay

## 2020-10-08 ENCOUNTER — Encounter: Payer: Self-pay | Admitting: *Deleted

## 2020-10-08 DIAGNOSIS — R9389 Abnormal findings on diagnostic imaging of other specified body structures: Secondary | ICD-10-CM | POA: Insufficient documentation

## 2020-10-08 DIAGNOSIS — M47814 Spondylosis without myelopathy or radiculopathy, thoracic region: Secondary | ICD-10-CM | POA: Diagnosis not present

## 2020-10-08 DIAGNOSIS — I2510 Atherosclerotic heart disease of native coronary artery without angina pectoris: Secondary | ICD-10-CM | POA: Diagnosis not present

## 2020-10-08 DIAGNOSIS — C3490 Malignant neoplasm of unspecified part of unspecified bronchus or lung: Secondary | ICD-10-CM | POA: Diagnosis not present

## 2020-10-08 DIAGNOSIS — M955 Acquired deformity of pelvis: Secondary | ICD-10-CM | POA: Diagnosis not present

## 2020-10-08 DIAGNOSIS — M9903 Segmental and somatic dysfunction of lumbar region: Secondary | ICD-10-CM | POA: Diagnosis not present

## 2020-10-08 DIAGNOSIS — M9905 Segmental and somatic dysfunction of pelvic region: Secondary | ICD-10-CM | POA: Diagnosis not present

## 2020-10-08 DIAGNOSIS — R918 Other nonspecific abnormal finding of lung field: Secondary | ICD-10-CM

## 2020-10-08 DIAGNOSIS — M62830 Muscle spasm of back: Secondary | ICD-10-CM | POA: Diagnosis not present

## 2020-10-08 DIAGNOSIS — J984 Other disorders of lung: Secondary | ICD-10-CM | POA: Diagnosis not present

## 2020-10-08 LAB — POCT I-STAT CREATININE: Creatinine, Ser: 1 mg/dL (ref 0.61–1.24)

## 2020-10-08 MED ORDER — IOHEXOL 300 MG/ML  SOLN
75.0000 mL | Freq: Once | INTRAMUSCULAR | Status: AC | PRN
  Administered 2020-10-08: 75 mL via INTRAVENOUS

## 2020-10-08 NOTE — Telephone Encounter (Signed)
Hey, it looks like pt was scheduled for CPE on 04/03/21.   Will you plz also schedule him for wellness and lab visits before that CPE?

## 2020-10-08 NOTE — Progress Notes (Signed)
  Oncology Nurse Navigator Documentation  Navigator Location: [facility] (10/08/20 1500) Referral Date to RadOnc/MedOnc: 10/08/20 (10/08/20 1500) )Navigator Encounter Type: Introductory Phone Call (10/08/20 1500)   Abnormal Finding Date: 10/08/20 (10/08/20 1500)                   Treatment Phase: Abnormal Scans (10/08/20 1500) Barriers/Navigation Needs: Coordination of Care (10/08/20 1500)   Interventions: Coordination of Care (10/08/20 1500)   Coordination of Care: Appts;Radiology (10/08/20 1500)        Acuity: Level 2-Minimal Needs (1-2 Barriers Identified) (10/08/20 1500)    referral received today from Dr. Danise Mina for new lung mass. Per Dr. Jacinto Reap, okay to schedule in the lung clinic Friday 5/6 and try to schedule PET scan prior. Will work on coordinating appts and notify pt once scheduled.      Time Spent with Patient: 30 (10/08/20 1500)

## 2020-10-08 NOTE — Telephone Encounter (Signed)
Name of Medication: Tramadol Name of Pharmacy: [facility] or Written Date and Quantity: 09/11/20, #60 Last Office Visit and Type: 10/02/20, 6 mo f/u Next Office Visit and Type: 04/03/21, CPE Last Controlled Substance Agreement Date: 01/18/16 Last UDS: 01/18/16

## 2020-10-10 NOTE — Telephone Encounter (Signed)
Noted  

## 2020-10-10 NOTE — Telephone Encounter (Signed)
Patient states that he has cancer and that he is up in the air about making any further appts due to the cancer. He would not schedule with me. EM

## 2020-10-10 NOTE — Telephone Encounter (Signed)
ERx 

## 2020-10-11 ENCOUNTER — Other Ambulatory Visit: Payer: PPO

## 2020-10-11 NOTE — Assessment & Plan Note (Addendum)
#   Imaging findings highly concerning for malignancy-if proven malignancy on biopsy; cT3N1.   # Await PET scan for further evaluation/bone lesions/ planned on 5/09.  Patient will likely need biopsy for confirmation of malignancy /histology NGS testing.   #Discussed options in general for lung cancer-include surgery; chemoradiation; palliative chemotherapy.  Await surgical evaluation; check PFTs refer to Dr. Roxan Hockey.    # If patient is not a surgical candidate for any reason -based on current available information-patient likely candidate for definitive chemoradiation.  Patient also need brain MRI-given the risk of metastasis to brain-await results of the biopsy.  # CAD-s/p CABG.  No CHF.  Stable.  Thank you Dr.Giuierrez for allowing me to participate in the care of your pleasant patient. Please do not hesitate to contact me with questions or concerns in the interim.  Discussed with [facility].  In my absence patient follow-up with the covering physician to review pathology/plan of care.   # DISPOSITION: # labs today- cbc/cmp/CEA/LDH/PT-PTT # referral to [place] re: lung cancer # referral to IR re; CT Biopsy # follow up TBD- Dr.B  # I reviewed the blood work- with the patient in detail; also reviewed the imaging independently [as summarized above]; and with the patient in detail.

## 2020-10-11 NOTE — Progress Notes (Signed)
Tumor Board Documentation  Randall Bowlby. was presented by Norvel Richards, RN at our Tumor Board on 10/11/2020, which included representatives from medical oncology,radiation oncology,surgical oncology,internal medicine,navigation,pathology,radiology,surgical,pharmacy,genetics,research,palliative care,pulmonology.  Randall Ali currently presents as a new patient,for discussion with history of the following treatments: active survellience.  Additionally, we reviewed previous medical and familial history, history of present illness, and recent lab results along with all available histopathologic and imaging studies. The tumor board considered available treatment options and made the following recommendations: Additional screening (PET Scan) Refer for Pulonary evaluation, possible surgical referral or IR for biopsy  The following procedures/referrals were also placed: No orders of the defined types were placed in this encounter.   Clinical Trial Status: not discussed   Staging used: To be determined,Clinical Stage  AJCC Staging: T: 3 N: 1   Group: Probable Lung cancer   National site-specific guidelines   were discussed with respect to the case.  Tumor board is a meeting of clinicians from various specialty areas who evaluate and discuss patients for whom a multidisciplinary approach is being considered. Final determinations in the plan of care are those of the provider(s). The responsibility for follow up of recommendations given during tumor board is that of the provider.   Today's extended care, comprehensive team conference, Randall Ali was not present for the discussion and was not examined.   Multidisciplinary Tumor Board is a multidisciplinary case peer review process.  Decisions discussed in the Multidisciplinary Tumor Board reflect the opinions of the specialists present at the conference without having examined the patient.  Ultimately, treatment and diagnostic decisions rest with the primary  provider(s) and the patient.

## 2020-10-11 NOTE — Progress Notes (Signed)
[facility] NOTE  Patient Care Team: Ria Bush, MD as PCP - General (Family Medicine) [name] [name], MD as PCP - Cardiology (Cardiology) [name], MD (Internal Medicine) Minna Merritts, MD as Consulting Physician (Cardiology) Pieter Partridge, DO as Consulting Physician (Neurology) Cammie Sickle, MD as Consulting Physician (Hematology and Oncology) Telford Nab, RN as Oncology Nurse Navigator  CHIEF COMPLAINTS/PURPOSE OF CONSULTATION: lung nodule/mass  # 1. April 2022- LLL ~4.0 cm mass in the superior segment left lower lobe abuts the major fissure and the posterior pleural surface without visible chest wall invasion without pleural effusion. There 2-3 other small nodules in the left lower lobe, largest measures 9 mm in diameter. These are suspicious for same lobe satellite lesions and there is left hilar adenopathy. Assuming non-small cell lung cancer the appearance is compatible with T3 N1 M0 disease (stage IIIA).  # CAD [CABG 2016; [place]  Oncology History   No history exists.     HISTORY OF PRESENTING ILLNESS:  [name] 80 y.o.  male history of smoking is here for further evaluation and recommendations for lung mass.  Patient states that he had a "cold" more than a month ago.  Which led to further evaluation at the urgent care clinic with a chest x-ray that showed left lower lobe mass.  This led to a CT scan-findings as mentioned above.  Patient currently denies any shortness of breath or cough.  Appetite is good.  He is physically very active.  He goes to the gym almost every day.  Does not endorse any shortness of breath on exertion or cough.  No hemoptysis.   Review of Systems  Constitutional: Positive for malaise/fatigue. Negative for chills, diaphoresis, fever and weight loss.  HENT: Negative for nosebleeds and sore throat.   Eyes: Negative for double vision.  Respiratory: Negative for cough, hemoptysis,  sputum production, shortness of breath and wheezing.   Cardiovascular: Negative for chest pain, palpitations, orthopnea and leg swelling.  Gastrointestinal: Negative for abdominal pain, blood in stool, constipation, diarrhea, heartburn, melena, nausea and vomiting.  Genitourinary: Negative for dysuria, frequency and urgency.  Musculoskeletal: Positive for back pain and joint pain.  Skin: Negative.  Negative for itching and rash.  Neurological: Negative for dizziness, tingling, focal weakness, weakness and headaches.  Endo/Heme/Allergies: Does not bruise/bleed easily.  Psychiatric/Behavioral: Negative for depression. The patient is not nervous/anxious and does not have insomnia.      MEDICAL HISTORY:  Past Medical History:  Diagnosis Date  . Allergy    seasonal  . Arthritis    all over- in general   . CAD (coronary artery disease)    a. inferior wall MI 10/01 s/p PCI/DES to RCA; b. Myoview 3/16 neg for ischemia; c. LHC 8/16: ostLAD 80%, OM1 70%, OM2 70% x 2 lesions, mRCA 30%, dRCA 70% s/p 4-V CABG 01/24/15 (LIMA-LAD, VG- OM1, VG-OM2, VG-PDA)   . Cancer (HCC)    skin, melanoma  . Carotid artery disease ([place])    a. [place] 8/16: 1-39% bilateral ICA stenosis  . Cataract    removed  . Diastolic dysfunction    a. TTE 8/16: EF 55-60%, no RWMA, Gr1DD, calcified mitral annulus, mild biatrial enlargement  . Erectile dysfunction   . GERD (gastroesophageal reflux disease)   . History of elbow surgery   . History of hiatal hernia   . HLD (hyperlipidemia)   . HTN (hypertension)   . Inferior myocardial infarction ([place]) 10/01   stent RCA  .  Postoperative wound infection 02/02/2015  . Reflux esophagitis   . Sleep apnea 2017   CPAP at night    SURGICAL HISTORY: Past Surgical History:  Procedure Laterality Date  . arm surgery  2010  . BROW LIFT Bilateral 11/25/2019   Procedure: BROW PTOSIS REPAIR BILATERAL;  Surgeon: Karle Starch, MD;  Location: [facility];  Service: Ophthalmology;   Laterality: Bilateral;  sleep apnea  . CARDIAC CATHETERIZATION  06/24/11  . CARDIAC CATHETERIZATION N/A 01/18/2015   Procedure: Left Heart Cath with coronary angiography;  Surgeon: Minna Merritts, MD;  Location: [facility];  Service: Cardiovascular;  Laterality: N/A;  . CARDIAC CATHETERIZATION N/A 01/18/2015   Procedure: Intravascular Pressure Wire/FFR Study;  Surgeon: [name], MD;  Location: [facility];  Service: Cardiovascular;  Laterality: N/A;  . CAROTID STENT  03/10/2011  . COLONOSCOPY  2010  . COLONOSCOPY  06/14/2014   Dr Hilarie Fredrickson  . CORONARY ARTERY BYPASS GRAFT N/A 01/24/2015   Procedure: CORONARY ARTERY BYPASS GRAFTING x 4 (LIMA-LAD, SVG-Int 1- Int 2, SVG-PD) ENDOSCOPIC GREATER SAPHENOUS VEIN HARVEST LEFT LEG;  Surgeon: [name], MD;  Location: [facility];  Service: Open Heart Surgery;  Laterality: N/A;  . EMBOLECTOMY  06/15/2019   Procedure: EMBOLECTOMY;  Surgeon: Katha Cabal, MD;  Location: [facility];  Service: Vascular;;  right superficial femoral artery  . ENDARTERECTOMY FEMORAL Right 06/15/2019   Procedure: ENDARTERECTOMY FEMORAL;  Surgeon: Katha Cabal, MD;  Location: [facility];  Service: Vascular;  Laterality: Right;  common femoral profunda femoris superficial femoral  . ESOPHAGOGASTRODUODENOSCOPY (EGD) WITH PROPOFOL N/A 04/24/2016   Procedure: ESOPHAGOGASTRODUODENOSCOPY (EGD) WITH PROPOFOL;  Surgeon: Jerene Bears, MD;  Location: [facility];  Service: Gastroenterology;  Laterality: N/A;  . EYE SURGERY     lasik 15 yrs. ago, cataracts removed - both eyes   . HAMMER TOE SURGERY     right toe  . INSERTION OF ILIAC STENT Right 06/15/2019   Procedure: INSERTION OF ILIAC STENT ( STENTING OF SFA/POP ARTERY );  Surgeon: Katha Cabal, MD;  Location: [facility];  Service: Vascular;  Laterality: Right;  angioplpasty and stent placement: right superficial femoral right tibiopopliteal trunk bilateral common iliac arteries  . LEFT HEART CATH AND  CORONARY ANGIOGRAPHY Left 06/10/2017   Procedure: LEFT HEART CATH AND CORONARY ANGIOGRAPHY;  Surgeon: Minna Merritts, MD;  Location: [facility];  Service: Cardiovascular;  Laterality: Left;  . LOWER EXTREMITY ANGIOGRAPHY Left 01/04/2019   Procedure: LOWER EXTREMITY ANGIOGRAPHY;  Surgeon: Katha Cabal, MD;  Location: [facility];  Service: Cardiovascular;  Laterality: Left;  . LOWER EXTREMITY ANGIOGRAPHY Right 01/25/2019   Procedure: LOWER EXTREMITY ANGIOGRAPHY;  Surgeon: Katha Cabal, MD;  Location: [facility];  Service: Cardiovascular;  Laterality: Right;  . NASAL SINUS SURGERY  2008   septpolasty, bilateral turbinate reduction  . SHOULDER ARTHROSCOPY  2012  . TEE WITHOUT CARDIOVERSION N/A 01/24/2015   Procedure: TRANSESOPHAGEAL ECHOCARDIOGRAM (TEE);  Surgeon: [name], MD;  Location: [facility];  Service: Open Heart Surgery;  Laterality: N/A;  . [place]  . UPPER GI ENDOSCOPY  07/2014, 04-24-16   Dr Raquel James  . WRIST SURGERY  2011    SOCIAL HISTORY: Social History   Socioeconomic History  . Marital status: Single    Spouse name: Not on file  . Number of children: Not on file  . Years of education: 79  . Highest education level: Not  on file  Occupational History  . Occupation: retired    Comment: Agricultural consultant  Tobacco Use  . Smoking status: Former Smoker    Packs/day: 2.50    Years: 40.00    Pack years: 100.00    Types: Cigarettes    Quit date: 03/24/2000    Years since quitting: 20.5  . Smokeless tobacco: Never Used  Vaping Use  . Vaping Use: Never used  Substance and Sexual Activity  . Alcohol use: Yes    Alcohol/week: 5.0 standard drinks    Types: 5 Shots of liquor per week    Comment: occ  . Drug use: No  . Sexual activity: Yes  Other Topics Concern  . Not on file  Social History Narrative   Singled; lives with son and dog    Occ: retired, back part time at [employer];    Activity: gym 4-5d/wk   Diet: good water,  fruits/vegetables daily   Caffeine Use-yes      ------------------------------------       Car sales- retd; [employer] store- retd; quit smoking 2001. Alcohol couple nights a week. Live in [place]. Daughter lives 10 mins.    Social Determinants of Health   Financial Resource Strain: Low Risk   . Difficulty of Paying Living Expenses: Not hard at all  Food Insecurity: No Food Insecurity  . Worried About Charity fundraiser in the Last Year: Never true  . Ran Out of Food in the Last Year: Never true  Transportation Needs: No Transportation Needs  . Lack of Transportation (Medical): No  . Lack of Transportation (Non-Medical): No  Physical Activity: Sufficiently Active  . Days of Exercise per Week: 7 days  . Minutes of Exercise per Session: 60 min  Stress: No Stress Concern Present  . Feeling of Stress : Not at all  Social Connections: Not on file  Intimate Partner Violence: Not At Risk  . Fear of Current or Ex-Partner: No  . Emotionally Abused: No  . Physically Abused: No  . Sexually Abused: No    FAMILY HISTORY: Family History  Problem Relation Age of Onset  . Hypertension Mother   . Heart disease Mother   . Hypertension Father   . Diabetes Father   . Heart disease Brother 6  . Cancer Paternal Grandfather   . Lymphoma Sister   . Colon cancer Neg Hx   . Prostate cancer Neg Hx   . Bladder Cancer Neg Hx   . Kidney cancer Neg Hx     ALLERGIES:  is allergic to flomax [tamsulosin].  MEDICATIONS:  Current Outpatient Medications  Medication Sig Dispense Refill  . acetaminophen (TYLENOL) 500 MG tablet Take 1,000 mg by mouth at bedtime.    . Ascorbic Acid (VITAMIN C) 1000 MG tablet Take 1,000 mg by mouth daily.    [name] aspirin 81 MG EC tablet Take 81 mg by mouth daily.      . Calcium-Magnesium-Vitamin D (CALCIUM 1200+D3 PO) Take 1 tablet by mouth daily.    . Cholecalciferol (VITAMIN D3) 50 MCG (2000 UT) TABS Take 2,000 Units by mouth daily.    [name] ibuprofen (ADVIL,MOTRIN) 200 MG  tablet Take 400 mg by mouth every 6 (six) hours as needed for headache or moderate pain.     [name] lisinopril (ZESTRIL) 5 MG tablet TAKE 1 TABLET BY MOUTH DAILY 90 tablet 3  . metoprolol succinate (TOPROL-XL) 25 MG 24 hr tablet TAKE 1/2 TABLET (12.5 MG) BY MOUTH DAILY 45 tablet 3  . pantoprazole (  PROTONIX) 40 MG tablet Take 1 tablet (40 mg total) by mouth daily. 90 tablet 3  . Polyethyl Glycol-Propyl Glycol (LUBRICANT EYE DROPS) 0.4-0.3 % SOLN Place 1-2 drops into both eyes 3 (three) times daily as needed (burning eyes.).    [name] simvastatin (ZOCOR) 40 MG tablet Take 1 tablet (40 mg total) by mouth at bedtime. 90 tablet 0  . traMADol (ULTRAM) 50 MG tablet TAKE 2 TABLETS BY MOUTH DAILY AS NEEDED 60 tablet 0  . vitamin B-12 (CYANOCOBALAMIN) 500 MCG tablet Take 500 mcg by mouth daily.    . vitamin E 1000 UNIT capsule Take 1,000 Units by mouth daily. Vitamin E 450 mg    . Zinc 50 MG TABS Take 50 mg by mouth daily.    . nitroGLYCERIN (NITROSTAT) 0.4 MG SL tablet Place 1 tablet (0.4 mg total) under the tongue every 5 (five) minutes as needed for chest pain. (Patient not taking: Reported on 10/12/2020) 25 tablet 3  . NONFORMULARY OR COMPOUNDED ITEM Trimix (30/1/10)-(Pap/Phent/PGE)  Test Dose 3 82ml vials  Qty #3 [place] [phone] Fax [phone] (Patient not taking: Reported on 10/12/2020) 3 each 0   No current facility-administered medications for this visit.      [name]  PHYSICAL EXAMINATION: ECOG PERFORMANCE STATUS: 0 - Asymptomatic  Vitals:   10/12/20 0919  BP: 131/75  Pulse: 70  Resp: 16  Temp: 98.1 F (36.7 C)  SpO2: 96%   Filed Weights   10/12/20 0919  Weight: 205 lb (93 kg)    Physical Exam HENT:     Head: Normocephalic and atraumatic.     Mouth/Throat:     Pharynx: No oropharyngeal exudate.  Eyes:     Pupils: Pupils are equal, round, and reactive to light.  Cardiovascular:     Rate and Rhythm: Normal rate and regular rhythm.  Pulmonary:     Effort:  No respiratory distress.     Breath sounds: No wheezing.  Abdominal:     General: Bowel sounds are normal. There is no distension.     Palpations: Abdomen is soft. There is no mass.     Tenderness: There is no abdominal tenderness. There is no guarding or rebound.  Musculoskeletal:        General: No tenderness. Normal range of motion.     Cervical back: Normal range of motion and neck supple.  Skin:    General: Skin is warm.  Neurological:     Mental Status: He is alert and oriented to person, place, and time.  Psychiatric:        Mood and Affect: Affect normal.      LABORATORY DATA:  I have reviewed the data as listed Lab Results  Component Value Date   WBC 7.0 03/15/2020   HGB 14.7 03/15/2020   HCT 43.4 03/15/2020   MCV 92.1 03/15/2020   PLT 218.0 03/15/2020   Recent Labs    03/15/20 0804 10/08/20 1147  NA 140  --   K 4.5  --   CL 105  --   CO2 28  --   GLUCOSE 90  --   BUN 24*  --   CREATININE 1.14 1.00  CALCIUM 9.6  --   PROT 6.4  --   ALBUMIN 4.3  --   AST 11  --   ALT 14  --   ALKPHOS 51  --   BILITOT 0.6  --     RADIOGRAPHIC STUDIES: I have personally reviewed the radiological  images as listed and agreed with the findings in the report. DG Chest 2 View  Result Date: 10/03/2020 CLINICAL DATA:  Follow-up abnormal chest x-ray EXAM: CHEST - 2 VIEW COMPARISON:  06/04/2017 FINDINGS: Rounded masslike opacity is seen in the left perihilar region concerning for mass/lung cancer. Right lung clear. Prior CABG. Heart is normal size. No effusions or acute bony abnormality. IMPRESSION: Masslike opacity in the left perihilar region concerning for possible malignancy. Recommend further evaluation with chest CT with IV contrast. These results will be called to the ordering clinician or representative by the Radiologist Assistant, and communication documented in the PACS or [employer]. Electronically Signed   By: Rolm Baptise M.D.   On: 10/03/2020 10:05   CT Chest W  Contrast  Result Date: 10/08/2020 CLINICAL DATA:  Abnormal chest radiograph, left perihilar nodule. EXAM: CT CHEST WITH CONTRAST TECHNIQUE: Multidetector CT imaging of the chest was performed during intravenous contrast administration. CONTRAST:  18mL OMNIPAQUE IOHEXOL 300 MG/ML  SOLN COMPARISON:  Chest radiograph 10/02/2020 FINDINGS: Cardiovascular: Coronary, aortic arch, and branch vessel atherosclerotic vascular disease. Prior CABG. Mediastinum/Nodes: Left posterior hilar node 1.3 cm in short axis on image 83 series 2. Lungs/Pleura: Superior segment left lower lobe lung mass 4.0 by 3.3 by 3.9 cm, this abuts the major fissure and the posterior pleural surface, no compelling findings of chest wall invasion. Another tumor nodule in the left lower lobe abutting the major fissure measures 0.9 by 0.7 by 0.8 cm on image 90 series 3. A subpleural nodule in the left lower lobe on image 133 of series 3 measures 0.7 by 0.5 by 0.6 cm. Other mild subpleural nodularity is present in this vicinity as on image 122 of series 3. There is also some scarring in the posterior basal segment left lower lobe. A right upper lobe nodule with central calcification measures 0.6 cm in diameter on image 59 series 3, previously the same, overall benign appearance. Stable scarring inferiorly in the right middle lobe along the diaphragm. There is scarring or subsegmental atelectasis in the posterior basal segment right lower lobe. Upper Abdomen: Hypodense hepatic lesions favor cysts and are not changed from 2018. Contracted gallbladder. No adrenal mass identified. Prominence of stool in the upper abdomen. Musculoskeletal: Thoracic spondylosis. Thoracic spine degenerative disc disease. IMPRESSION: 1. 4.0 cm mass in the superior segment left lower lobe abuts the major fissure and the posterior pleural surface without visible chest wall invasion without pleural effusion. There 2-3 other small nodules in the left lower lobe, largest measures 9 mm  in diameter. These are suspicious for same lobe satellite lesions and there is left hilar adenopathy. Assuming non-small cell lung cancer the appearance is compatible with T3 N1 M0 disease (stage IIIA). 2. Aortic Atherosclerosis (ICD10-I70.0). Coronary atherosclerosis with prior CABG. 3. Chronically stable and benign right upper lobe nodule 0.6 cm in diameter. 4. Hepatic cysts. 5. Prominence of stool in the upper abdomen raise the possibility of constipation. 6. Thoracic spondylosis. Electronically Signed   By: Van Clines M.D.   On: 10/08/2020 12:30    ASSESSMENT & PLAN:   Mass of lower lobe of left lung # Imaging findings highly concerning for malignancy-if proven malignancy on biopsy; cT3N1.   # Await PET scan for further evaluation/bone lesions/ planned on 5/09.  Patient will likely need biopsy for confirmation of malignancy /histology NGS testing.   #Discussed options in general for lung cancer-include surgery; chemoradiation; palliative chemotherapy.  Await surgical evaluation; check PFTs refer to Dr. Roxan Hockey.    #  If patient is not a surgical candidate for any reason -based on current available information-patient likely candidate for definitive chemoradiation.  Patient also need brain MRI-given the risk of metastasis to brain-await results of the biopsy.  # CAD-s/p CABG.  No CHF.  Stable.  Thank you Dr.Giuierrez for allowing me to participate in the care of your pleasant patient. Please do not hesitate to contact me with questions or concerns in the interim.  Discussed with [facility].  # DISPOSITION: # labs today- cbc/cmp/CEA/LDH/PT-PTT # referral to [place] re: lung cancer # referral to IR re; CT Biopsy # follow up TBD- Dr.B  All  questions were answered. The patient knows to call the clinic with any problems, questions or concerns.    Cammie Sickle, MD 10/12/2020 10:10 AM

## 2020-10-12 ENCOUNTER — Encounter: Payer: Self-pay | Admitting: Internal Medicine

## 2020-10-12 ENCOUNTER — Inpatient Hospital Stay: Payer: PPO

## 2020-10-12 ENCOUNTER — Inpatient Hospital Stay: Payer: PPO | Attending: Internal Medicine | Admitting: Internal Medicine

## 2020-10-12 ENCOUNTER — Encounter: Payer: Self-pay | Admitting: *Deleted

## 2020-10-12 DIAGNOSIS — G4730 Sleep apnea, unspecified: Secondary | ICD-10-CM | POA: Insufficient documentation

## 2020-10-12 DIAGNOSIS — Z951 Presence of aortocoronary bypass graft: Secondary | ICD-10-CM | POA: Insufficient documentation

## 2020-10-12 DIAGNOSIS — R918 Other nonspecific abnormal finding of lung field: Secondary | ICD-10-CM

## 2020-10-12 DIAGNOSIS — Z8249 Family history of ischemic heart disease and other diseases of the circulatory system: Secondary | ICD-10-CM | POA: Diagnosis not present

## 2020-10-12 DIAGNOSIS — Z7982 Long term (current) use of aspirin: Secondary | ICD-10-CM

## 2020-10-12 DIAGNOSIS — R0489 Hemorrhage from other sites in respiratory passages: Secondary | ICD-10-CM | POA: Diagnosis not present

## 2020-10-12 DIAGNOSIS — I10 Essential (primary) hypertension: Secondary | ICD-10-CM | POA: Insufficient documentation

## 2020-10-12 DIAGNOSIS — C3432 Malignant neoplasm of lower lobe, left bronchus or lung: Secondary | ICD-10-CM | POA: Insufficient documentation

## 2020-10-12 DIAGNOSIS — Z79899 Other long term (current) drug therapy: Secondary | ICD-10-CM

## 2020-10-12 DIAGNOSIS — Z87891 Personal history of nicotine dependence: Secondary | ICD-10-CM | POA: Diagnosis not present

## 2020-10-12 DIAGNOSIS — I252 Old myocardial infarction: Secondary | ICD-10-CM | POA: Diagnosis not present

## 2020-10-12 DIAGNOSIS — Z833 Family history of diabetes mellitus: Secondary | ICD-10-CM | POA: Insufficient documentation

## 2020-10-12 DIAGNOSIS — I2510 Atherosclerotic heart disease of native coronary artery without angina pectoris: Secondary | ICD-10-CM | POA: Diagnosis not present

## 2020-10-12 LAB — COMPREHENSIVE METABOLIC PANEL
ALT: 16 U/L (ref 0–44)
AST: 16 U/L (ref 15–41)
Albumin: 4 g/dL (ref 3.5–5.0)
Alkaline Phosphatase: 56 U/L (ref 38–126)
Anion gap: 11 (ref 5–15)
BUN: 23 mg/dL (ref 8–23)
CO2: 22 mmol/L (ref 22–32)
Calcium: 9.1 mg/dL (ref 8.9–10.3)
Chloride: 104 mmol/L (ref 98–111)
Creatinine, Ser: 0.84 mg/dL (ref 0.61–1.24)
GFR, Estimated: >60 mL/min (ref >60)
Glucose, Bld: 101 mg/dL — ABNORMAL HIGH (ref 70–99)
Potassium: 4.4 mmol/L (ref 3.5–5.1)
Sodium: 137 mmol/L (ref 135–145)
Total Bilirubin: 0.7 mg/dL (ref 0.3–1.2)
Total Protein: 7 g/dL (ref 6.5–8.1)

## 2020-10-12 LAB — CBC WITH DIFFERENTIAL/PLATELET
Abs Immature Granulocytes: 0.05 10*3/uL (ref 0.00–0.07)
Basophils Absolute: 0.1 10*3/uL (ref 0.0–0.1)
Basophils Relative: 1 %
Eosinophils Absolute: 0.2 10*3/uL (ref 0.0–0.5)
Eosinophils Relative: 2 %
HCT: 42.2 % (ref 39.0–52.0)
Hemoglobin: 14.4 g/dL (ref 13.0–17.0)
Immature Granulocytes: 1 %
Lymphocytes Relative: 17 %
Lymphs Abs: 1.5 10*3/uL (ref 0.7–4.0)
MCH: 31 pg (ref 26.0–34.0)
MCHC: 34.1 g/dL (ref 30.0–36.0)
MCV: 90.9 fL (ref 80.0–100.0)
Monocytes Absolute: 0.9 10*3/uL (ref 0.1–1.0)
Monocytes Relative: 11 %
Neutro Abs: 5.9 10*3/uL (ref 1.7–7.7)
Neutrophils Relative %: 68 %
Platelets: 245 10*3/uL (ref 150–400)
RBC: 4.64 MIL/uL (ref 4.22–5.81)
RDW: 12.6 % (ref 11.5–15.5)
WBC: 8.7 10*3/uL (ref 4.0–10.5)
nRBC: 0 % (ref 0.0–0.2)

## 2020-10-12 LAB — APTT: aPTT: 35 seconds (ref 24–36)

## 2020-10-12 LAB — PROTIME-INR
INR: 1.1 (ref 0.8–1.2)
Prothrombin Time: 13.8 seconds (ref 11.4–15.2)

## 2020-10-12 NOTE — Progress Notes (Signed)
  Oncology Nurse Navigator Documentation  Navigator Location: [facility] (10/12/20 1100)   )Navigator Encounter Type: Clinic/MDC (10/12/20 1100)               Multidisiplinary Clinic Date: 10/12/20 (10/12/20 1100) Multidisiplinary Clinic Type: Thoracic (10/12/20 1100)   Patient Visit Type: MedOnc (10/12/20 1100)   Barriers/Navigation Needs: Coordination of Care (10/12/20 1100)   Interventions: Coordination of Care;Referrals (10/12/20 1100) Referrals: Other (thoracic surgery) (10/12/20 1100) Coordination of Care: Appts;Radiology (10/12/20 1100)       met with patient and his daughter during initial consult with Dr. Rogue Bussing to discuss recent imaging and next steps. All questions answered during visit. Reviewed upcoming appts and informed pt that he will be called with further appointments once scheduled. Contact info given and instructed to call with any further questions or needs. Pt verbalized understanding.            Time Spent with Patient: 60 (10/12/20 1100)

## 2020-10-12 NOTE — Addendum Note (Signed)
Addended by: Telford Nab on: 10/12/2020 10:14 AM   Modules accepted: Orders

## 2020-10-13 LAB — CEA: CEA: 0.4 ng/mL (ref 0.0–4.7)

## 2020-10-15 ENCOUNTER — Other Ambulatory Visit: Payer: Self-pay

## 2020-10-15 ENCOUNTER — Telehealth: Payer: Self-pay | Admitting: *Deleted

## 2020-10-15 ENCOUNTER — Ambulatory Visit
Admission: RE | Admit: 2020-10-15 | Discharge: 2020-10-15 | Disposition: A | Discharge status: Home / Self Care | Payer: PPO | Source: Ambulatory Visit | Attending: Internal Medicine | Admitting: Internal Medicine

## 2020-10-15 ENCOUNTER — Ambulatory Visit: Payer: PPO

## 2020-10-15 ENCOUNTER — Other Ambulatory Visit
Admission: RE | Admit: 2020-10-15 | Discharge: 2020-10-15 | Disposition: A | Discharge status: Home / Self Care | Payer: PPO | Source: Ambulatory Visit | Attending: Internal Medicine | Admitting: Internal Medicine

## 2020-10-15 DIAGNOSIS — R918 Other nonspecific abnormal finding of lung field: Secondary | ICD-10-CM

## 2020-10-15 DIAGNOSIS — Z01812 Encounter for preprocedural laboratory examination: Secondary | ICD-10-CM | POA: Insufficient documentation

## 2020-10-15 DIAGNOSIS — Z20822 Contact with and (suspected) exposure to covid-19: Secondary | ICD-10-CM | POA: Diagnosis not present

## 2020-10-15 LAB — SARS CORONAVIRUS 2 (TAT 6-24 HRS): SARS Coronavirus 2: NEGATIVE

## 2020-10-15 NOTE — Telephone Encounter (Signed)
Pt made aware of upcoming appts for covid testing, PFT's, and lung biopsy this week. Pt verbalized understanding. Nothing further needed at this time.

## 2020-10-17 ENCOUNTER — Ambulatory Visit: Payer: PPO

## 2020-10-17 ENCOUNTER — Other Ambulatory Visit: Payer: Self-pay

## 2020-10-17 ENCOUNTER — Ambulatory Visit
Admission: RE | Admit: 2020-10-17 | Discharge: 2020-10-17 | Disposition: A | Discharge status: Home / Self Care | Payer: PPO | Source: Ambulatory Visit | Attending: Internal Medicine | Admitting: Internal Medicine

## 2020-10-17 ENCOUNTER — Other Ambulatory Visit: Payer: Self-pay | Admitting: Radiology

## 2020-10-17 DIAGNOSIS — I700 Atherosclerosis of aorta: Secondary | ICD-10-CM | POA: Diagnosis not present

## 2020-10-17 DIAGNOSIS — C3490 Malignant neoplasm of unspecified part of unspecified bronchus or lung: Secondary | ICD-10-CM | POA: Insufficient documentation

## 2020-10-17 DIAGNOSIS — R918 Other nonspecific abnormal finding of lung field: Secondary | ICD-10-CM

## 2020-10-17 LAB — GLUCOSE, CAPILLARY: Glucose-Capillary: 96 mg/dL (ref 70–99)

## 2020-10-17 MED ORDER — FLUDEOXYGLUCOSE F - 18 (FDG) INJECTION
10.6000 | Freq: Once | INTRAVENOUS | Status: AC | PRN
  Administered 2020-10-17: 10.9 via INTRAVENOUS

## 2020-10-17 MED ORDER — ALBUTEROL SULFATE (2.5 MG/3ML) 0.083% IN NEBU
2.5000 mg | INHALATION_SOLUTION | Freq: Once | RESPIRATORY_TRACT | Status: AC
  Administered 2020-10-17: 2.5 mg via RESPIRATORY_TRACT
  Filled 2020-10-17: qty 3

## 2020-10-17 NOTE — H&P (Signed)
Chief Complaint: Patient was seen in consultation today for lung mass biopsy  Referring Physician(s): Cammie Sickle  Supervising Physician: Ruthann Cancer  Patient Status: [facility] - Out-pt  History of Present Illness: Randall Sabedra. is a 80 y.o. male with a medical history significant for CAD s/p stent placement, MI, HTN, sleep apnea, tobacco use and melanoma. He was recently evaluated by his PCP for a "cold" who sent the patient for a chest x-ray. This showed a left lower lobe mass and further imaging was ordered.   CT chest 10/08/20 Lungs/Pleura: Superior segment left lower lobe lung mass 4.0 by 3.3 by 3.9 cm, this abuts the major fissure and the posterior pleural surface, no compelling findings of chest wall invasion. Another tumor nodule in the left lower lobe abutting the major fissure measures 0.9 by 0.7 by 0.8 cm on image 90 series 3. A subpleural nodule in the left lower lobe on image 133 of series 3 measures 0.7 by 0.5 by 0.6 cm. Other mild subpleural nodularity is present in this vicinity as on image 122 of series 3. There is also some scarring in the posterior basal segment left lower lobe. IMPRESSION: 1. 4.0 cm mass in the superior segment left lower lobe abuts the major fissure and the posterior pleural surface without visible chest wall invasion without pleural effusion. There 2-3 other small nodules in the left lower lobe, largest measures 9 mm in diameter. These are suspicious for same lobe satellite lesions and there is left hilar adenopathy. Assuming non-small cell lung cancer the appearance is compatible with T3 N1 M0 disease (stage IIIA). 2. Aortic Atherosclerosis (ICD10-I70.0). Coronary atherosclerosis with prior CABG. 3. Chronically stable and benign right upper lobe nodule 0.6 cm in diameter. 4. Hepatic cysts. 5. Prominence of stool in the upper abdomen raise the possibility of constipation. 6. Thoracic spondylosis.  Interventional  Radiology has been requested by Oncology to evaluate this patient for an image-guided left lower lobe mass biopsy for further work up. Imaging has been reviewed and procedure approved by Dr. Kathlene Cote.    Past Medical History:  Diagnosis Date  . Allergy    seasonal  . Arthritis    all over- in general   . CAD (coronary artery disease)    a. inferior wall MI 10/01 s/p PCI/DES to RCA; b. Myoview 3/16 neg for ischemia; c. LHC 8/16: ostLAD 80%, OM1 70%, OM2 70% x 2 lesions, mRCA 30%, dRCA 70% s/p 4-V CABG 01/24/15 (LIMA-LAD, VG- OM1, VG-OM2, VG-PDA)   . Cancer (HCC)    skin, melanoma  . Carotid artery disease ([place])    a. [place] 8/16: 1-39% bilateral ICA stenosis  . Cataract    removed  . Diastolic dysfunction    a. TTE 8/16: EF 55-60%, no RWMA, Gr1DD, calcified mitral annulus, mild biatrial enlargement  . Erectile dysfunction   . GERD (gastroesophageal reflux disease)   . History of elbow surgery   . History of hiatal hernia   . HLD (hyperlipidemia)   . HTN (hypertension)   . Inferior myocardial infarction ([place]) 10/01   stent RCA  . Postoperative wound infection 02/02/2015  . Reflux esophagitis   . Sleep apnea 2017   CPAP at night    Past Surgical History:  Procedure Laterality Date  . arm surgery  2010  . BROW LIFT Bilateral 11/25/2019   Procedure: BROW PTOSIS REPAIR BILATERAL;  Surgeon: Karle Starch, MD;  Location: [place];  Service: Ophthalmology;  Laterality: Bilateral;  sleep apnea  .  CARDIAC CATHETERIZATION  06/24/11  . CARDIAC CATHETERIZATION N/A 01/18/2015   Procedure: Left Heart Cath with coronary angiography;  Surgeon: Minna Merritts, MD;  Location: [place] [facility];  Service: Cardiovascular;  Laterality: N/A;  . CARDIAC CATHETERIZATION N/A 01/18/2015   Procedure: Intravascular Pressure Wire/FFR Study;  Surgeon: [name], MD;  Location: [place] [facility];  Service: Cardiovascular;  Laterality: N/A;  . CAROTID STENT  03/10/2011  . COLONOSCOPY  2010   . COLONOSCOPY  06/14/2014   Dr Hilarie Fredrickson  . CORONARY ARTERY BYPASS GRAFT N/A 01/24/2015   Procedure: CORONARY ARTERY BYPASS GRAFTING x 4 (LIMA-LAD, SVG-Int 1- Int 2, SVG-PD) ENDOSCOPIC GREATER SAPHENOUS VEIN HARVEST LEFT LEG;  Surgeon: [name], MD;  Location: [place];  Service: Open Heart Surgery;  Laterality: N/A;  . EMBOLECTOMY  06/15/2019   Procedure: EMBOLECTOMY;  Surgeon: Katha Cabal, MD;  Location: [facility];  Service: Vascular;;  right superficial femoral artery  . ENDARTERECTOMY FEMORAL Right 06/15/2019   Procedure: ENDARTERECTOMY FEMORAL;  Surgeon: Katha Cabal, MD;  Location: [facility];  Service: Vascular;  Laterality: Right;  common femoral profunda femoris superficial femoral  . ESOPHAGOGASTRODUODENOSCOPY (EGD) WITH PROPOFOL N/A 04/24/2016   Procedure: ESOPHAGOGASTRODUODENOSCOPY (EGD) WITH PROPOFOL;  Surgeon: Jerene Bears, MD;  Location: [facility];  Service: Gastroenterology;  Laterality: N/A;  . EYE SURGERY     lasik 15 yrs. ago, cataracts removed - both eyes   . HAMMER TOE SURGERY     right toe  . INSERTION OF ILIAC STENT Right 06/15/2019   Procedure: INSERTION OF ILIAC STENT ( STENTING OF SFA/POP ARTERY );  Surgeon: Katha Cabal, MD;  Location: [facility];  Service: Vascular;  Laterality: Right;  angioplpasty and stent placement: right superficial femoral right tibiopopliteal trunk bilateral common iliac arteries  . LEFT HEART CATH AND CORONARY ANGIOGRAPHY Left 06/10/2017   Procedure: LEFT HEART CATH AND CORONARY ANGIOGRAPHY;  Surgeon: Minna Merritts, MD;  Location: [place] [facility];  Service: Cardiovascular;  Laterality: Left;  . LOWER EXTREMITY ANGIOGRAPHY Left 01/04/2019   Procedure: LOWER EXTREMITY ANGIOGRAPHY;  Surgeon: Katha Cabal, MD;  Location: [place] [facility];  Service: Cardiovascular;  Laterality: Left;  . LOWER EXTREMITY ANGIOGRAPHY Right 01/25/2019   Procedure: LOWER EXTREMITY ANGIOGRAPHY;  Surgeon: Katha Cabal, MD;   Location: [place] [facility];  Service: Cardiovascular;  Laterality: Right;  . NASAL SINUS SURGERY  2008   septpolasty, bilateral turbinate reduction  . SHOULDER ARTHROSCOPY  2012  . TEE WITHOUT CARDIOVERSION N/A 01/24/2015   Procedure: TRANSESOPHAGEAL ECHOCARDIOGRAM (TEE);  Surgeon: [name], MD;  Location: [place];  Service: Open Heart Surgery;  Laterality: N/A;  . [place]  . UPPER GI ENDOSCOPY  07/2014, 04-24-16   Dr Raquel James  . WRIST SURGERY  2011    Allergies: Flomax [tamsulosin]  Medications: Prior to Admission medications   Medication Sig Start Date End Date Taking? Authorizing Provider  acetaminophen (TYLENOL) 500 MG tablet Take 1,000 mg by mouth at bedtime.    [provider]  Ascorbic Acid (VITAMIN C) 1000 MG tablet Take 1,000 mg by mouth daily.    [provider]  aspirin 81 MG EC tablet Take 81 mg by mouth daily.      [provider]  Calcium-Magnesium-Vitamin D (CALCIUM 1200+D3 PO) Take 1 tablet by mouth daily.    [provider]  Cholecalciferol (VITAMIN D3) 50 MCG (2000 UT) TABS Take 2,000 Units by mouth daily.  [provider]  ibuprofen (ADVIL,MOTRIN) 200 MG tablet Take 400 mg by mouth every 6 (six) hours as needed for headache or moderate pain.     [provider]  lisinopril (ZESTRIL) 5 MG tablet TAKE 1 TABLET BY MOUTH DAILY 04/27/20   Ria Bush, MD  metoprolol succinate (TOPROL-XL) 25 MG 24 hr tablet TAKE 1/2 TABLET (12.5 MG) BY MOUTH DAILY 05/21/20   Ria Bush, MD  nitroGLYCERIN (NITROSTAT) 0.4 MG SL tablet Place 1 tablet (0.4 mg total) under the tongue every 5 (five) minutes as needed for chest pain. Patient not taking: Reported on 10/12/2020 06/16/16   Minna Merritts, MD  NONFORMULARY OR COMPOUNDED ITEM Trimix (30/1/10)-(Pap/Phent/PGE)  Test Dose 3 32ml vials  Qty #3 [place] [phone] Fax [phone] Patient not taking: Reported on 10/12/2020  01/24/19   Festus Aloe, MD  pantoprazole (PROTONIX) 40 MG tablet Take 1 tablet (40 mg total) by mouth daily. 01/04/20   Ria Bush, MD  Polyethyl Glycol-Propyl Glycol (LUBRICANT EYE DROPS) 0.4-0.3 % SOLN Place 1-2 drops into both eyes 3 (three) times daily as needed (burning eyes.).    [provider]  simvastatin (ZOCOR) 40 MG tablet Take 1 tablet (40 mg total) by mouth at bedtime. 01/10/20   Minna Merritts, MD  traMADol (ULTRAM) 50 MG tablet TAKE 2 TABLETS BY MOUTH DAILY AS NEEDED 10/10/20   Ria Bush, MD  vitamin B-12 (CYANOCOBALAMIN) 500 MCG tablet Take 500 mcg by mouth daily.    [provider]  vitamin E 1000 UNIT capsule Take 1,000 Units by mouth daily. Vitamin E 450 mg    [provider]  Zinc 50 MG TABS Take 50 mg by mouth daily.    [provider]     Family History  Problem Relation Age of Onset  . Hypertension Mother   . Heart disease Mother   . Hypertension Father   . Diabetes Father   . Heart disease Brother 45  . Cancer Paternal Grandfather   . Lymphoma Sister   . Colon cancer Neg Hx   . Prostate cancer Neg Hx   . Bladder Cancer Neg Hx   . Kidney cancer Neg Hx     Social History   Socioeconomic History  . Marital status: Single    Spouse name: Not on file  . Number of children: Not on file  . Years of education: 79  . Highest education level: Not on file  Occupational History  . Occupation: retired    Comment: Agricultural consultant  Tobacco Use  . Smoking status: Former Smoker    Packs/day: 2.50    Years: 40.00    Pack years: 100.00    Types: Cigarettes    Quit date: 03/24/2000    Years since quitting: 20.5  . Smokeless tobacco: Never Used  Vaping Use  . Vaping Use: Never used  Substance and Sexual Activity  . Alcohol use: Yes    Comment: occ  . Drug use: No  . Sexual activity: Yes  Other Topics Concern  . Not on file  Social History Narrative   Singled; lives with son and dog    Occ: retired, back part  time at [employer];    Activity: gym 4-5d/wk   Diet: good water, fruits/vegetables daily   Caffeine Use-yes      ------------------------------------       Car sales- retd; [employer]- retd; quit smoking 2001. Alcohol couple nights a week. Live in [place]. Daughter lives  10 mins.    Social Determinants of Health   Financial Resource Strain: Low Risk   . Difficulty of Paying Living Expenses: Not hard at all  Food Insecurity: No Food Insecurity  . Worried About Charity fundraiser in the Last Year: Never true  . Ran Out of Food in the Last Year: Never true  Transportation Needs: No Transportation Needs  . Lack of Transportation (Medical): No  . Lack of Transportation (Non-Medical): No  Physical Activity: Sufficiently Active  . Days of Exercise per Week: 7 days  . Minutes of Exercise per Session: 60 min  Stress: No Stress Concern Present  . Feeling of Stress : Not at all  Social Connections: Not on file    Review of Systems: A 12 point ROS discussed and pertinent positives are indicated in the HPI above.  All other systems are negative.  Review of Systems  Constitutional: Negative for appetite change and fatigue.  Respiratory: Negative for cough and shortness of breath.   Cardiovascular: Negative for chest pain and leg swelling.  Gastrointestinal: Negative for abdominal pain, diarrhea, nausea and vomiting.  Musculoskeletal: Positive for back pain.       Chronic  Neurological: Negative for dizziness and headaches.    Vital Signs: BP (!) 160/82   Pulse 79   Temp 97.9 F (36.6 C) (Oral)   Resp 18   Ht 5\' 8"  (1.727 m)   Wt 205 lb (93 kg)   SpO2 96%   BMI 31.17 kg/m   Physical Exam Constitutional:      General: He is not in acute distress. HENT:     Mouth/Throat:     Mouth: Mucous membranes are moist.     Pharynx: Oropharynx is clear.     Comments: Upper dentures Cardiovascular:     Rate and Rhythm: Normal rate and regular rhythm.     Pulses: Normal pulses.      Heart sounds: Normal heart sounds.  Pulmonary:     Effort: Pulmonary effort is normal.     Breath sounds: Normal breath sounds.  Abdominal:     General: Bowel sounds are normal.     Palpations: Abdomen is soft.  Musculoskeletal:        General: Normal range of motion.     Cervical back: Normal range of motion.  Skin:    General: Skin is warm and dry.  Neurological:     Mental Status: He is alert and oriented to person, place, and time.     Imaging: DG Chest 2 View  Result Date: 10/03/2020 CLINICAL DATA:  Follow-up abnormal chest x-ray EXAM: CHEST - 2 VIEW COMPARISON:  06/04/2017 FINDINGS: Rounded masslike opacity is seen in the left perihilar region concerning for mass/lung cancer. Right lung clear. Prior CABG. Heart is normal size. No effusions or acute bony abnormality. IMPRESSION: Masslike opacity in the left perihilar region concerning for possible malignancy. Recommend further evaluation with chest CT with IV contrast. These results will be called to the ordering clinician or representative by the Radiologist Assistant, and communication documented in the PACS or [employer]. Electronically Signed   By: Rolm Baptise M.D.   On: 10/03/2020 10:05   CT Chest W Contrast  Result Date: 10/08/2020 CLINICAL DATA:  Abnormal chest radiograph, left perihilar nodule. EXAM: CT CHEST WITH CONTRAST TECHNIQUE: Multidetector CT imaging of the chest was performed during intravenous contrast administration. CONTRAST:  59mL OMNIPAQUE IOHEXOL 300 MG/ML  SOLN COMPARISON:  Chest radiograph 10/02/2020 FINDINGS: Cardiovascular: Coronary, aortic arch,  and branch vessel atherosclerotic vascular disease. Prior CABG. Mediastinum/Nodes: Left posterior hilar node 1.3 cm in short axis on image 83 series 2. Lungs/Pleura: Superior segment left lower lobe lung mass 4.0 by 3.3 by 3.9 cm, this abuts the major fissure and the posterior pleural surface, no compelling findings of chest wall invasion. Another tumor nodule in  the left lower lobe abutting the major fissure measures 0.9 by 0.7 by 0.8 cm on image 90 series 3. A subpleural nodule in the left lower lobe on image 133 of series 3 measures 0.7 by 0.5 by 0.6 cm. Other mild subpleural nodularity is present in this vicinity as on image 122 of series 3. There is also some scarring in the posterior basal segment left lower lobe. A right upper lobe nodule with central calcification measures 0.6 cm in diameter on image 59 series 3, previously the same, overall benign appearance. Stable scarring inferiorly in the right middle lobe along the diaphragm. There is scarring or subsegmental atelectasis in the posterior basal segment right lower lobe. Upper Abdomen: Hypodense hepatic lesions favor cysts and are not changed from 2018. Contracted gallbladder. No adrenal mass identified. Prominence of stool in the upper abdomen. Musculoskeletal: Thoracic spondylosis. Thoracic spine degenerative disc disease. IMPRESSION: 1. 4.0 cm mass in the superior segment left lower lobe abuts the major fissure and the posterior pleural surface without visible chest wall invasion without pleural effusion. There 2-3 other small nodules in the left lower lobe, largest measures 9 mm in diameter. These are suspicious for same lobe satellite lesions and there is left hilar adenopathy. Assuming non-small cell lung cancer the appearance is compatible with T3 N1 M0 disease (stage IIIA). 2. Aortic Atherosclerosis (ICD10-I70.0). Coronary atherosclerosis with prior CABG. 3. Chronically stable and benign right upper lobe nodule 0.6 cm in diameter. 4. Hepatic cysts. 5. Prominence of stool in the upper abdomen raise the possibility of constipation. 6. Thoracic spondylosis. Electronically Signed   By: Van Clines M.D.   On: 10/08/2020 12:30    Labs:  CBC: Recent Labs    03/15/20 0804 10/12/20 1019  WBC 7.0 8.7  HGB 14.7 14.4  HCT 43.4 42.2  PLT 218.0 245    COAGS: Recent Labs    10/12/20 1019  INR  1.1  APTT 35    BMP: Recent Labs    03/15/20 0804 10/08/20 1147 10/12/20 1019  NA 140  --  137  K 4.5  --  4.4  CL 105  --  104  CO2 28  --  22  GLUCOSE 90  --  101*  BUN 24*  --  23  CALCIUM 9.6  --  9.1  CREATININE 1.14 1.00 0.84  GFRNONAA  --   --  >60    LIVER FUNCTION TESTS: Recent Labs    03/15/20 0804 10/12/20 1019  BILITOT 0.6 0.7  AST 11 16  ALT 14 16  ALKPHOS 51 56  PROT 6.4 7.0  ALBUMIN 4.3 4.0    TUMOR MARKERS: No results for input(s): AFPTM, CEA, CA199, CHROMGRNA in the last 8760 hours.  Assessment and Plan:  Left lower lobe lung mass: Randall Clonts L. Trilby Leaver., 80 year old male, presents today to the [facility] Interventional Radiology department for an image-guided left lower lobe lung mass biopsy.   Risks and benefits of this procedure were discussed with the patient and/or patient's family including, but not limited to bleeding, infection, damage to adjacent structures or low yield requiring additional tests.  All of the questions  were answered and there is agreement to proceed. He has been NPO and vitals have been reviewed. Labs are pending but will be reviewed prior to the start of the procedure.   Consent signed and in chart.  Thank you for this interesting consult.  I greatly enjoyed meeting Randall Ali. and look forward to participating in their care.  A copy of this report was sent to the requesting provider on this date.  Electronically Signed: Soyla Dryer, AGACNP-BC [phone] 10/18/2020, 9:05 AM   I spent a total of  30 Minutes   in face to face in clinical consultation, greater than 50% of which was counseling/coordinating care for left lower lobe mass biopsy

## 2020-10-18 ENCOUNTER — Ambulatory Visit
Admission: RE | Admit: 2020-10-18 | Discharge: 2020-10-18 | Disposition: A | Discharge status: Home / Self Care | Payer: PPO | Source: Ambulatory Visit | Attending: Interventional Radiology | Admitting: Interventional Radiology

## 2020-10-18 ENCOUNTER — Ambulatory Visit
Admission: RE | Admit: 2020-10-18 | Discharge: 2020-10-18 | Disposition: A | Discharge status: Home / Self Care | Payer: PPO | Source: Ambulatory Visit | Attending: Internal Medicine | Admitting: Internal Medicine

## 2020-10-18 ENCOUNTER — Other Ambulatory Visit: Payer: Self-pay

## 2020-10-18 DIAGNOSIS — I10 Essential (primary) hypertension: Secondary | ICD-10-CM | POA: Insufficient documentation

## 2020-10-18 DIAGNOSIS — I2510 Atherosclerotic heart disease of native coronary artery without angina pectoris: Secondary | ICD-10-CM | POA: Diagnosis not present

## 2020-10-18 DIAGNOSIS — Z87891 Personal history of nicotine dependence: Secondary | ICD-10-CM | POA: Diagnosis not present

## 2020-10-18 DIAGNOSIS — R0489 Hemorrhage from other sites in respiratory passages: Secondary | ICD-10-CM | POA: Diagnosis not present

## 2020-10-18 DIAGNOSIS — R918 Other nonspecific abnormal finding of lung field: Secondary | ICD-10-CM

## 2020-10-18 DIAGNOSIS — G4730 Sleep apnea, unspecified: Secondary | ICD-10-CM | POA: Insufficient documentation

## 2020-10-18 DIAGNOSIS — Z79899 Other long term (current) drug therapy: Secondary | ICD-10-CM | POA: Insufficient documentation

## 2020-10-18 DIAGNOSIS — Z7901 Long term (current) use of anticoagulants: Secondary | ICD-10-CM | POA: Diagnosis not present

## 2020-10-18 DIAGNOSIS — I252 Old myocardial infarction: Secondary | ICD-10-CM | POA: Diagnosis not present

## 2020-10-18 DIAGNOSIS — C3432 Malignant neoplasm of lower lobe, left bronchus or lung: Secondary | ICD-10-CM | POA: Diagnosis not present

## 2020-10-18 DIAGNOSIS — Z7982 Long term (current) use of aspirin: Secondary | ICD-10-CM | POA: Diagnosis not present

## 2020-10-18 LAB — PROTIME-INR
INR: 1.1 (ref 0.8–1.2)
Prothrombin Time: 13.8 seconds (ref 11.4–15.2)

## 2020-10-18 LAB — CBC
HCT: 43.6 % (ref 39.0–52.0)
Hemoglobin: 14.6 g/dL (ref 13.0–17.0)
MCH: 30.5 pg (ref 26.0–34.0)
MCHC: 33.5 g/dL (ref 30.0–36.0)
MCV: 91.2 fL (ref 80.0–100.0)
Platelets: 253 10*3/uL (ref 150–400)
RBC: 4.78 MIL/uL (ref 4.22–5.81)
RDW: 12.5 % (ref 11.5–15.5)
WBC: 8.9 10*3/uL (ref 4.0–10.5)
nRBC: 0 % (ref 0.0–0.2)

## 2020-10-18 MED ORDER — SODIUM CHLORIDE 0.9 % IV SOLN: INTRAVENOUS | Status: DC

## 2020-10-18 MED ORDER — FENTANYL CITRATE (PF) 100 MCG/2ML IJ SOLN
INTRAMUSCULAR | Status: AC
  Filled 2020-10-18: qty 2

## 2020-10-18 MED ORDER — MIDAZOLAM HCL 2 MG/2ML IJ SOLN
INTRAMUSCULAR | Status: AC
  Filled 2020-10-18: qty 2

## 2020-10-18 MED ORDER — MIDAZOLAM HCL 2 MG/2ML IJ SOLN
INTRAMUSCULAR | Status: AC | PRN
  Administered 2020-10-18 (×2): 1 mg via INTRAVENOUS

## 2020-10-18 MED ORDER — FENTANYL CITRATE (PF) 100 MCG/2ML IJ SOLN
INTRAMUSCULAR | Status: AC | PRN
  Administered 2020-10-18 (×2): 50 ug via INTRAVENOUS

## 2020-10-18 NOTE — Procedures (Signed)
Interventional Radiology Procedure Note  Procedure: CT guided left lung biopsy  Findings: Please refer to procedural dictation for full description.  Left lung mass biopsied with 18 ga core x3.  Biosentry plug deployed.    Complications: Small post-biopsy pulmonary hemorrhage, unchanged in size after 5 minutes.  Estimated Blood Loss: < 5 mL  Recommendations: Bedrest, NPO until follow up chest x-ray in 1 hour. Follow up Pathology results.   Ruthann Cancer, MD Pager: [phone]

## 2020-10-18 NOTE — Progress Notes (Signed)
Patient clinically stable post Lung biopsy per Dr Serafina Royals, tolerated well. Vitals stable pre and post procedure. sats stable on room air, awake/alert and oriented post procedure. Denies complaints at this time. Received Versed 2 mg along with Fentanyl 100 mcg IV for procedure. Report given to [facility] post procedure in specials. NPO until 1130 with CXR ordered, thereafter to have po;s .

## 2020-10-23 ENCOUNTER — Other Ambulatory Visit: Payer: Self-pay | Admitting: Pathology

## 2020-10-23 LAB — SURGICAL PATHOLOGY

## 2020-10-24 ENCOUNTER — Other Ambulatory Visit: Payer: Self-pay

## 2020-10-24 ENCOUNTER — Institutional Professional Consult (permissible substitution): Payer: PPO | Admitting: Thoracic Surgery (Cardiothoracic Vascular Surgery)

## 2020-10-24 ENCOUNTER — Other Ambulatory Visit: Payer: Self-pay | Admitting: Thoracic Surgery (Cardiothoracic Vascular Surgery)

## 2020-10-24 ENCOUNTER — Encounter: Payer: Self-pay | Admitting: Thoracic Surgery (Cardiothoracic Vascular Surgery)

## 2020-10-24 VITALS — BP 145/75 | HR 74 | Resp 20 | Ht 68.0 in | Wt 200.0 lb

## 2020-10-24 DIAGNOSIS — R918 Other nonspecific abnormal finding of lung field: Secondary | ICD-10-CM

## 2020-10-24 NOTE — Progress Notes (Signed)
PCP is Ria Bush, MD Referring Provider is Cammie Sickle, * Cardiology: [name], MD  Chief Complaint  Patient presents with  . 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  Randall Ali, Ali. is a 80 year old man with a past medical history significant for tobacco abuse, coronary disease, MI, coronary bypass grafting, diastolic dysfunction, peripheral arterial disease, hypertension, hyperlipidemia, reflux, arthritis, melanoma, and sleep apnea.  He had coronary bypass graft x4 by Dr. Servando Snare in 2016.  His postoperative course was complicated by atrial fibrillation,  Enterobacter sternal wound infection, sepsis,and delirium.  Randall Ali presented with a cold couple of months ago.  That led to a chest x-ray which showed a left lower lobe mass.  A CT of the chest was done on 10/08/2020 which showed a 4 x 3.3 x 3.9 cm left lower lobe lung mass abutting the pleural surface but with no definite chest wall invasion.  There were some other smaller nodules in the left lower lobe.  There also was some suspicion for hilar adenopathy.  A PET/CT on 10/17/2020 showed the left lower lobe mass was hypermetabolic.  There was a hypermetabolic nodal metastasis in the left hilum.  He denies any chest pain, pressure, tightness, or shortness of breath.  He does have bilateral hip pain and right calf pain with walking.  He uses CPAP at night.  He says he bruises easily.  He is no longer on Plavix.  He is retired but still works Retail buyer.  He smoked about 3 packs a day before quitting in 2010.  Zubrod Score: At the time of surgery this patient's most appropriate activity status/level should be described as: []     0    Normal activity, no symptoms [x]     1    Restricted in physical strenuous activity but ambulatory, able to do out light work []     2    Ambulatory and  capable of self care, unable to do work activities, up and about >50 % of waking hours                              []     3    Only limited self care, in bed greater than 50% of waking hours []     4    Completely disabled, no self care, confined to bed or chair []     5    Moribund  Past Medical History:  Diagnosis Date  . Allergy    seasonal  . Arthritis    all over- in general   . CAD (coronary artery disease)    a. inferior wall MI 10/01 s/p PCI/DES to RCA; b. Myoview 3/16 neg for ischemia; c. LHC 8/16: ostLAD 80%, OM1 70%, OM2 70% x 2 lesions, mRCA 30%, dRCA 70% s/p 4-V CABG 01/24/15 (LIMA-LAD, VG- OM1, VG-OM2, VG-PDA)   . Cancer (HCC)    skin, melanoma  . Carotid artery disease ([place])    a. [place] 8/16: 1-39% bilateral ICA stenosis  . Cataract    removed  . Diastolic dysfunction    a. TTE 8/16: EF 55-60%, no RWMA, Gr1DD, calcified mitral annulus, mild biatrial enlargement  . Erectile dysfunction   . GERD (gastroesophageal reflux disease)   . History of elbow surgery   . History of hiatal hernia   .  HLD (hyperlipidemia)   . HTN (hypertension)   . Inferior myocardial infarction ([place]) 10/01   stent RCA  . Postoperative wound infection 02/02/2015  . Reflux esophagitis   . Sleep apnea 2017   CPAP at night    Past Surgical History:  Procedure Laterality Date  . arm surgery  2010  . BROW LIFT Bilateral 11/25/2019   Procedure: BROW PTOSIS REPAIR BILATERAL;  Surgeon: Karle Starch, MD;  Location: [facility];  Service: Ophthalmology;  Laterality: Bilateral;  sleep apnea  . CARDIAC CATHETERIZATION  06/24/11  . CARDIAC CATHETERIZATION N/A 01/18/2015   Procedure: Left Heart Cath with coronary angiography;  Surgeon: Minna Merritts, MD;  Location: [facility];  Service: Cardiovascular;  Laterality: N/A;  . CARDIAC CATHETERIZATION N/A 01/18/2015   Procedure: Intravascular Pressure Wire/FFR Study;  Surgeon: [name], MD;  Location: [facility];  Service:  Cardiovascular;  Laterality: N/A;  . CAROTID STENT  03/10/2011  . COLONOSCOPY  2010  . COLONOSCOPY  06/14/2014   Dr Hilarie Fredrickson  . CORONARY ARTERY BYPASS GRAFT N/A 01/24/2015   Procedure: CORONARY ARTERY BYPASS GRAFTING x 4 (LIMA-LAD, SVG-Int 1- Int 2, SVG-PD) ENDOSCOPIC GREATER SAPHENOUS VEIN HARVEST LEFT LEG;  Surgeon: [name], MD;  Location: [facility];  Service: Open Heart Surgery;  Laterality: N/A;  . EMBOLECTOMY  06/15/2019   Procedure: EMBOLECTOMY;  Surgeon: Katha Cabal, MD;  Location: [facility];  Service: Vascular;;  right superficial femoral artery  . ENDARTERECTOMY FEMORAL Right 06/15/2019   Procedure: ENDARTERECTOMY FEMORAL;  Surgeon: Katha Cabal, MD;  Location: [facility];  Service: Vascular;  Laterality: Right;  common femoral profunda femoris superficial femoral  . ESOPHAGOGASTRODUODENOSCOPY (EGD) WITH PROPOFOL N/A 04/24/2016   Procedure: ESOPHAGOGASTRODUODENOSCOPY (EGD) WITH PROPOFOL;  Surgeon: Jerene Bears, MD;  Location: [facility];  Service: Gastroenterology;  Laterality: N/A;  . EYE SURGERY     lasik 15 yrs. ago, cataracts removed - both eyes   . HAMMER TOE SURGERY     right toe  . INSERTION OF ILIAC STENT Right 06/15/2019   Procedure: INSERTION OF ILIAC STENT ( STENTING OF SFA/POP ARTERY );  Surgeon: Katha Cabal, MD;  Location: [facility];  Service: Vascular;  Laterality: Right;  angioplpasty and stent placement: right superficial femoral right tibiopopliteal trunk bilateral common iliac arteries  . LEFT HEART CATH AND CORONARY ANGIOGRAPHY Left 06/10/2017   Procedure: LEFT HEART CATH AND CORONARY ANGIOGRAPHY;  Surgeon: Minna Merritts, MD;  Location: [facility];  Service: Cardiovascular;  Laterality: Left;  . LOWER EXTREMITY ANGIOGRAPHY Left 01/04/2019   Procedure: LOWER EXTREMITY ANGIOGRAPHY;  Surgeon: Katha Cabal, MD;  Location: [facility];  Service: Cardiovascular;  Laterality: Left;  . LOWER EXTREMITY ANGIOGRAPHY Right 01/25/2019    Procedure: LOWER EXTREMITY ANGIOGRAPHY;  Surgeon: Katha Cabal, MD;  Location: [facility];  Service: Cardiovascular;  Laterality: Right;  . NASAL SINUS SURGERY  2008   septpolasty, bilateral turbinate reduction  . SHOULDER ARTHROSCOPY  2012  . TEE WITHOUT CARDIOVERSION N/A 01/24/2015   Procedure: TRANSESOPHAGEAL ECHOCARDIOGRAM (TEE);  Surgeon: [name], MD;  Location: [facility];  Service: Open Heart Surgery;  Laterality: N/A;  . [place]  . UPPER GI ENDOSCOPY  07/2014, 04-24-16   Dr Raquel James  . WRIST SURGERY  2011    Family History  Problem Relation Age of Onset  . Hypertension Mother   . Heart disease Mother   . Hypertension  Father   . Diabetes Father   . Heart disease Brother 100  . Cancer Paternal Grandfather   . Lymphoma Sister   . Colon cancer Neg Hx   . Prostate cancer Neg Hx   . Bladder Cancer Neg Hx   . Kidney cancer Neg Hx     Social History Social History   Tobacco Use  . Smoking status: Former Smoker    Packs/day: 2.50    Years: 40.00    Pack years: 100.00    Types: Cigarettes    Quit date: 03/24/2000    Years since quitting: 20.6  . Smokeless tobacco: Never Used  Vaping Use  . Vaping Use: Never used  Substance Use Topics  . Alcohol use: Yes    Comment: occ  . Drug use: No    Current Outpatient Medications  Medication Sig Dispense Refill  . acetaminophen (TYLENOL) 500 MG tablet Take 1,000 mg by mouth at bedtime.    . Ascorbic Acid (VITAMIN C) 1000 MG tablet Take 1,000 mg by mouth daily.    [name] aspirin 81 MG EC tablet Take 81 mg by mouth daily.      . Calcium-Magnesium-Vitamin D (CALCIUM 1200+D3 PO) Take 1 tablet by mouth daily.    . Cholecalciferol (VITAMIN D3) 50 MCG (2000 UT) TABS Take 2,000 Units by mouth daily.    [name] lisinopril (ZESTRIL) 5 MG tablet TAKE 1 TABLET BY MOUTH DAILY 90 tablet 3  . metoprolol succinate (TOPROL-XL) 25 MG 24 hr tablet TAKE 1/2 TABLET (12.5 MG) BY MOUTH DAILY 45 tablet 3  . nitroGLYCERIN  (NITROSTAT) 0.4 MG SL tablet Place 1 tablet (0.4 mg total) under the tongue every 5 (five) minutes as needed for chest pain. 25 tablet 3  . NONFORMULARY OR COMPOUNDED ITEM Trimix (30/1/10)-(Pap/Phent/PGE)  Test Dose 3 72ml vials  Qty #3 Refills 0  [facility] [phone] Fax [phone] 3 each 0  . pantoprazole (PROTONIX) 40 MG tablet Take 1 tablet (40 mg total) by mouth daily. 90 tablet 3  . Polyethyl Glycol-Propyl Glycol (LUBRICANT EYE DROPS) 0.4-0.3 % SOLN Place 1-2 drops into both eyes 3 (three) times daily as needed (burning eyes.).    [name] simvastatin (ZOCOR) 40 MG tablet Take 1 tablet (40 mg total) by mouth at bedtime. 90 tablet 0  . traMADol (ULTRAM) 50 MG tablet TAKE 2 TABLETS BY MOUTH DAILY AS NEEDED 60 tablet 0  . vitamin E 1000 UNIT capsule Take 1,000 Units by mouth daily. Vitamin E 450 mg    . Zinc 50 MG TABS Take 50 mg by mouth daily.     No current facility-administered medications for this visit.    Allergies  Allergen Reactions  . Flomax [Tamsulosin] Other (See Comments)    Orthostatic dizziness    Review of Systems  Constitutional: Negative for activity change, fatigue and unexpected weight change.  HENT: Negative for trouble swallowing and voice change.   Eyes: Negative for visual disturbance.  Respiratory: Negative for cough, shortness of breath and wheezing.   Cardiovascular: Negative for chest pain and leg swelling.  Gastrointestinal: Negative for abdominal distention and abdominal pain.  Genitourinary: Negative for difficulty urinating.  Musculoskeletal: Positive for arthralgias and gait problem.  Neurological: Negative for seizures, syncope and weakness.  Hematological: Bruises/bleeds easily.  All other systems reviewed and are negative.   BP (!) 145/75 (BP Location: Left Arm, Patient Position: Sitting)   Pulse 74   Resp 20   Ht 5\' 8"  (1.727 m)   Wt 200 lb (  90.7 kg)   SpO2 94% Comment: RA  BMI 30.41 kg/m  Physical Exam Vitals reviewed.   Constitutional:      General: He is not in acute distress.    Appearance: Normal appearance.  HENT:     Head: Normocephalic and atraumatic.  Eyes:     General: No scleral icterus.    Extraocular Movements: Extraocular movements intact.  Cardiovascular:     Rate and Rhythm: Normal rate and regular rhythm.     Heart sounds: Murmur (Faint systolic murmur) heard.  No friction rub. No gallop.      Comments: Palpable PT pulse on left, not on right Pulmonary:     Effort: Pulmonary effort is normal. No respiratory distress.     Breath sounds: Normal breath sounds. No wheezing or rales.  Abdominal:     General: There is no distension.     Palpations: Abdomen is soft.     Tenderness: There is no abdominal tenderness.  Musculoskeletal:     Cervical back: Neck supple.  Lymphadenopathy:     Cervical: No cervical adenopathy.  Skin:    General: Skin is warm and dry.  Neurological:     General: No focal deficit present.     Mental Status: He is alert and oriented to person, place, and time.     Cranial Nerves: No cranial nerve deficit.     Motor: No weakness.    Diagnostic Tests: NUCLEAR MEDICINE PET SKULL BASE TO THIGH  TECHNIQUE: 10.9 mCi F-18 FDG was injected intravenously. Full-ring PET imaging was performed from the skull base to thigh after the radiotracer. CT data was obtained and used for attenuation correction and anatomic localization.  Fasting blood glucose: 96 mg/dl  COMPARISON:  CT 10/08/2020  FINDINGS: Mediastinal blood pool activity: SUV max 2.2  Liver activity: SUV max 3.1  NECK: No hypermetabolic lymph nodes in the neck.  Incidental CT findings: none  CHEST: IN superior segment aspect of the LEFT lower lobe rounded mass measures 4.4 x 3.6 cm and shares a broad surface with the pleural. Mass is intensely hypermetabolic with SUV max equal 10.7 (image 107)  A small nodule along the oblique fissure in the same LEFT lower lobe measures 0.9 cm (image  111) with faint metabolic activity (SUV max equal 1.8).  There is intense hypermetabolic LEFT hilar lymph node measuring approximately 2.0 cm with SUV max equal 9.7 on image 104.  No hypermetabolic mediastinal lymph nodes. No hypermetabolic supraclavicular nodes.  Within the RIGHT lung, there is a calcified nodule in the RIGHT middle lobe (image 95/3). No suspicious nodularity.  Incidental CT findings: Post CABG.  ABDOMEN/PELVIS: In the RIGHT suprarenal space, there is a rounded nodule measuring 11 mm (image 155/series 3). This nodule is hypermetabolic with SUV max equal 6.11 on image 151. There is misregistration between the PET data set in the [place] set. interestingly, this nodule measured only 4 mm on CT 8 days prior (155/series 2).  Incidental CT findings: Atherosclerotic calcification of the aorta.  SKELETON: No focal hypermetabolic activity to suggest skeletal metastasis.  Incidental CT findings: none  IMPRESSION: 1. Hypermetabolic mass in the superior segment of the LEFT lower lobe consistent primary bronchogenic carcinoma. 2. Hypermetabolic nodal metastasis to the LEFT hilum. 3. Faint hypermetabolic nodule within the same LEFT lower lobe along the fissure is concerning for a metastatic lesion. 4. Enlarging nodule within the RIGHT suprarenal space concerning for a metastatic lesion.   Electronically Signed   By: Randall Ali.D.  On: 10/18/2020 11:21 I personally reviewed the PET/CT images.  There is a markedly hypermetabolic left lower lobe mass with a left hilar (N1) adenopathy.  Small nodule in the right suprarenal space that is also hypermetabolic.  Pulmonary function testing 10/17/2020 FVC 3.49 (92%) FEV1 2.72 (100%) FEV1 2.07 (76%) postbronchodilator DLCO 22.19 (95%)  Impression: Randall Ali is a 81 year old man with a past medical history significant for tobacco abuse, coronary disease, MI, coronary bypass grafting, diastolic  dysfunction, peripheral arterial disease, hypertension, hyperlipidemia, reflux, arthritis, melanoma, and sleep apnea.  He recently was found to have a 4 cm left lower lobe lung mass.  CT and PET/CT revealed this mass was hypermetabolic there was some small satellite nodules, at least T2 and likely T3 disease.  He also has N1 disease.  Clinical stage is at least IIB(T2N1), but more likely IIIA(T3N1) and possibly even stage IV.  He will need a biopsy to determine the status of the right suprarenal lesion.  If he has stage IV disease there really is no role for surgery.  If 2B or 3A surgery can potentially be part of multimodality treatment.  Potential options would include definitive chemoradiation, surgery followed by adjuvant chemotherapy with or without radiation, and neoadjuvant chemotherapy followed by surgery.  In his case I would favor neoadjuvant chemotherapy followed by surgery, but will need to discuss with Dr. Rogue Bussing.  I did discuss what surgical therapy might entail to Randall Ali and his daughter.  I informed him of the general nature of the procedure.  We would attempt to do it robotically.  However, with his previous bypass surgery and possible chest wall invasion, there is a good possibility he would need a thoracotomy and/or right chest wall resection.  I informed them of the general nature of the procedure including the need for general anesthesia, the incisions to be used, the use of the drainage tube postoperatively, the expected hospital stay, and the overall recovery.  I informed them of the indications, risks, benefits, and alternatives.  They understand the risks include, but not limited to death, MI, DVT, PE, bleeding, possible need for transfusion, infection, prolonged air leak, cardiac arrhythmias, as well as possibility of other unforeseeable complications.  He had coronary bypass grafting in 2016.  Is not currently having any symptoms.  I think is been over a year since he saw Dr.  [name].  We would need his assessment as to whether any further cardiac work-up would be necessary prior to surgical intervention.  PAD-has some claudication symptoms currently.  Unclear if hips are due to blood flow but right calf is almost certainly claudication.  No rest pain.  Hypertension-blood pressure mildly elevated on current regimen.  Not surprising given surgical consultation.  Tobacco abuse-history of heavy tobacco abuse.  Quit in 2010.  Pulmonary function testing near normal.  Had a paradoxical decrease in FEV1 with bronchodilators.  Plan: MR brain to rule out metastases Will check with [name] regarding any possible cardiac work-up Will review PET images with IR to see if suprarenal nodule is accessible for biopsy Will discuss with Dr. Rogue Bussing possible neoadjuvant chemo followed by surgery versus chemoradiation when he returns from vacation.  I spent over an hour in review of records, images, and in consultation with Randall Ali today. Melrose Nakayama, MD [facility] [phone]

## 2020-10-25 ENCOUNTER — Other Ambulatory Visit: Payer: Self-pay | Admitting: Thoracic Surgery (Cardiothoracic Vascular Surgery)

## 2020-10-25 ENCOUNTER — Telehealth: Payer: Self-pay | Admitting: Thoracic Surgery (Cardiothoracic Vascular Surgery)

## 2020-10-25 DIAGNOSIS — R918 Other nonspecific abnormal finding of lung field: Secondary | ICD-10-CM

## 2020-10-25 NOTE — Telephone Encounter (Signed)
      [address]       [place] [address]             [phone]      We reviewed Randall Ali PET/CT in our multidisciplinary thoracic oncology conference this morning.  Dr. Weber Cooks and Dr. Dwaine Gale radiology both concurred with the finding of a hypermetabolic nodule near the right adrenal and kidney but not involving either 1.  They feel it could be biopsied with CT guidance.  I discussed this finding with Randall Ali.  He is agreeable to having the biopsy done.  My office will arrange.

## 2020-10-26 ENCOUNTER — Telehealth: Payer: Self-pay | Admitting: *Deleted

## 2020-10-26 ENCOUNTER — Encounter (HOSPITAL_COMMUNITY): Payer: Self-pay | Admitting: Radiology

## 2020-10-26 ENCOUNTER — Other Ambulatory Visit: Payer: Self-pay | Admitting: *Deleted

## 2020-10-26 ENCOUNTER — Inpatient Hospital Stay: Payer: PPO | Admitting: Oncology

## 2020-10-26 DIAGNOSIS — R918 Other nonspecific abnormal finding of lung field: Secondary | ICD-10-CM

## 2020-10-26 NOTE — Progress Notes (Signed)
Orient Trilby Leaver. Male, 80 y.o., 1941-04-30  MRN:  [id number] Phone:  [phone] (H)       PCP:  Ria Bush, MD Coverage:  [employer]  Next Appt With Radiology ([facility] 2) 10/27/2020 at 1:00 PM           RE: CT Biopsy Received: Today Mir, Paula Libra, MD  Sandi Mariscal, MD; Garth Bigness D  Please schedule for only me to do in CT. Thank you   Mir        Previous Messages   ----- Message -----  From: Sandi Mariscal, MD  Sent: 10/26/2020  2:20 PM EDT  To: Paula Libra Mir, MD  Subject: FW: CT Biopsy                    ----- Message -----  From: Garth Bigness D  Sent: 10/26/2020 12:25 PM EDT  To: Ir Procedure Requests  Subject: CT Biopsy                     ATTN: DR.MIR as discussed in conference   Procedure:  CT Biopsy   Reason: Mass of lower lobe of left lung, right suprarenal nodule (next week if possible) ATTN: DR.MIR as discussed in conference   History: CT Biopsy, NM PET in computer   Provider: [place] Charon C   Provider Contact: [phone]

## 2020-10-26 NOTE — Progress Notes (Unsigned)
t bx

## 2020-10-26 NOTE — Telephone Encounter (Signed)
Second attempt made to [facility] to get pathology reports and last MD note from Dr. Evorn Gong. Message left requesting records and contact with fax number included. Will follow up again next week.

## 2020-10-27 ENCOUNTER — Ambulatory Visit
Admission: RE | Admit: 2020-10-27 | Discharge: 2020-10-27 | Disposition: A | Discharge status: Home / Self Care | Payer: PPO | Source: Ambulatory Visit | Attending: Thoracic Surgery (Cardiothoracic Vascular Surgery) | Admitting: Thoracic Surgery (Cardiothoracic Vascular Surgery)

## 2020-10-27 ENCOUNTER — Other Ambulatory Visit: Payer: Self-pay

## 2020-10-27 DIAGNOSIS — J3489 Other specified disorders of nose and nasal sinuses: Secondary | ICD-10-CM | POA: Diagnosis not present

## 2020-10-27 DIAGNOSIS — C3490 Malignant neoplasm of unspecified part of unspecified bronchus or lung: Secondary | ICD-10-CM | POA: Diagnosis not present

## 2020-10-27 DIAGNOSIS — R918 Other nonspecific abnormal finding of lung field: Secondary | ICD-10-CM | POA: Insufficient documentation

## 2020-10-27 MED ORDER — GADOBUTROL 1 MMOL/ML IV SOLN
9.0000 mL | Freq: Once | INTRAVENOUS | Status: AC | PRN
  Administered 2020-10-27: 9 mL via INTRAVENOUS

## 2020-10-29 DIAGNOSIS — M955 Acquired deformity of pelvis: Secondary | ICD-10-CM | POA: Diagnosis not present

## 2020-10-29 DIAGNOSIS — M9903 Segmental and somatic dysfunction of lumbar region: Secondary | ICD-10-CM | POA: Diagnosis not present

## 2020-10-29 DIAGNOSIS — M9905 Segmental and somatic dysfunction of pelvic region: Secondary | ICD-10-CM | POA: Diagnosis not present

## 2020-10-29 DIAGNOSIS — M62830 Muscle spasm of back: Secondary | ICD-10-CM | POA: Diagnosis not present

## 2020-10-30 ENCOUNTER — Telehealth: Payer: Self-pay | Admitting: Internal Medicine

## 2020-10-30 NOTE — Telephone Encounter (Signed)
On 5/23-spoke to patient regarding results of the PET scan; regarding the upcoming suprarenal mass biopsy.   On 5/24-discussed with Dr. Roxan Hockey regarding plan for neoadjuvant/palliative chemotherapy based upon results of the suprarenal mass biopsy.   Recommend follow-up this week-to discuss treatment options/plan of care.  Discussed with Hildred Alamin.  GB

## 2020-10-31 DIAGNOSIS — M62830 Muscle spasm of back: Secondary | ICD-10-CM | POA: Diagnosis not present

## 2020-10-31 DIAGNOSIS — M9905 Segmental and somatic dysfunction of pelvic region: Secondary | ICD-10-CM | POA: Diagnosis not present

## 2020-10-31 DIAGNOSIS — M955 Acquired deformity of pelvis: Secondary | ICD-10-CM | POA: Diagnosis not present

## 2020-10-31 DIAGNOSIS — M9903 Segmental and somatic dysfunction of lumbar region: Secondary | ICD-10-CM | POA: Diagnosis not present

## 2020-10-31 NOTE — Telephone Encounter (Signed)
Pt scheduled for follow up with Dr. Jacinto Reap on Fri 5/27 at 10:15am in [place]. Pt has been made aware and confirmed appt.

## 2020-11-01 ENCOUNTER — Other Ambulatory Visit: Payer: Self-pay | Admitting: Radiology

## 2020-11-01 ENCOUNTER — Other Ambulatory Visit: Payer: PPO

## 2020-11-01 NOTE — Progress Notes (Signed)
Tumor Board Documentation  Randall Ali. was presented by Dr Rogue Bussing at our Tumor Board on 11/01/2020, which included representatives from medical oncology,radiation oncology,radiology,pathology,navigation,internal medicine,genetics,palliative care,research,surgical,pharmacy.  Randall Ali currently presents as a new patient,for [facility],for new positive pathology with history of the following treatments: active survellience,surgical intervention(s).  Additionally, we reviewed previous medical and familial history, history of present illness, and recent lab results along with all available histopathologic and imaging studies. The tumor board considered available treatment options and made the following recommendations: Biopsy (abdominal nodule) Depending on bx results and staging, Surgery vs Neoadjuvant chemotherapy  The following procedures/referrals were also placed: No orders of the defined types were placed in this encounter.   Clinical Trial Status: not discussed   Staging used: To be determined  AJCC Staging:       Group: Non Small Cell Carcinoma   National site-specific guidelines   were discussed with respect to the case.  Tumor board is a meeting of clinicians from various specialty areas who evaluate and discuss patients for whom a multidisciplinary approach is being considered. Final determinations in the plan of care are those of the provider(s). The responsibility for follow up of recommendations given during tumor board is that of the provider.   Today's extended care, comprehensive team conference, Randall Ali was not present for the discussion and was not examined.   Multidisciplinary Tumor Board is a multidisciplinary case peer review process.  Decisions discussed in the Multidisciplinary Tumor Board reflect the opinions of the specialists present at the conference without having examined the patient.  Ultimately, treatment and diagnostic decisions rest with the primary  provider(s) and the patient.

## 2020-11-02 ENCOUNTER — Telehealth: Payer: Self-pay

## 2020-11-02 ENCOUNTER — Other Ambulatory Visit: Payer: Self-pay

## 2020-11-02 ENCOUNTER — Inpatient Hospital Stay (HOSPITAL_BASED_OUTPATIENT_CLINIC_OR_DEPARTMENT_OTHER): Payer: PPO | Admitting: Internal Medicine

## 2020-11-02 ENCOUNTER — Other Ambulatory Visit: Payer: Self-pay | Admitting: Radiology

## 2020-11-02 ENCOUNTER — Encounter: Payer: Self-pay | Admitting: Internal Medicine

## 2020-11-02 DIAGNOSIS — C3432 Malignant neoplasm of lower lobe, left bronchus or lung: Secondary | ICD-10-CM | POA: Insufficient documentation

## 2020-11-02 MED ORDER — ONDANSETRON HCL 8 MG PO TABS: ORAL_TABLET | ORAL | 1 refills | Status: DC

## 2020-11-02 MED ORDER — LIDOCAINE-PRILOCAINE 2.5-2.5 % EX CREA: TOPICAL_CREAM | CUTANEOUS | 0 refills | Status: DC

## 2020-11-02 MED ORDER — PROCHLORPERAZINE MALEATE 10 MG PO TABS: 10.0000 mg | ORAL_TABLET | Freq: Four times a day (QID) | ORAL | 1 refills | Status: DC | PRN

## 2020-11-02 NOTE — Telephone Encounter (Signed)
PA initiated for ondansetron 8 mg.   Key: [id number]

## 2020-11-02 NOTE — Assessment & Plan Note (Addendum)
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  MRI brain negative for malignancy. Order NGS  #Discussed with the patient/daughter-the pathology/and above staging.  They understand if the right suprarenal nodule-positive for cancer on biopsy-stage IV; the chemoimmunotherapy treatments are palliative.   Discussed the potential side effects including but not limited to-increasing fatigue, nausea vomiting, diarrhea, hair loss, sores in the mouth, increase risk of infection and also neuropathy.  Also discussed the mechanism of action of immunotherapy-potential risk of autoimmune side effects-including but not limited to thyroid; pneumonitis; colitis etc.   #Would recommend staging scan after 4 cycles-patient has excellent response-surgery could be recommended.  Discussed with Dr. Roxan Hockey.  # Chemotherapy education; port placement. Hopefully the planned start chemotherapy in 2 weeks Antiemetics-Zofran and Compazine; EMLA cream sent to pharmacy  # CAD-s/p CABG.  No CHF.  Stable.  Burling=Meb # DISPOSITION: # IR for port placement ASAP # Chemo ed- ASAP- [carbo-taxol-keytruda] # follow up on 06//10- MD[Ste[facility]]- labs- cbc/cmp; carbo-taxol-Keytruda;06-13-fulphila Dr.B  # 40 minutes face-to-face with the patient discussing the above plan of care; more than 50% of time spent on prognosis/ natural history; counseling and coordination.   # I reviewed the blood work- with the patient in detail; also reviewed the imaging independently [as summarized above]; and with the patient in detail.

## 2020-11-02 NOTE — Progress Notes (Signed)
[place] NOTE  Patient Care Team: Ria Bush, MD as PCP - General (Family Medicine) [name] [name], MD as PCP - Cardiology (Cardiology) [name], MD (Internal Medicine) Minna Merritts, MD as Consulting Physician (Cardiology) Pieter Partridge, DO as Consulting Physician (Neurology) Cammie Sickle, MD as Consulting Physician (Hematology and Oncology) Telford Nab, RN as Oncology Nurse Navigator  CHIEF COMPLAINTS/PURPOSE OF CONSULTATION: lung nodule/mass    Oncology History Overview Note  #MAY 2022-Lung cancer-non-small cell [CT guided bx] T3N1 vs stage IV [right suprarenal nodule-awaiting biopsy on 5/31];Dr.Hendrickson.  MRI brain negative for malignancy.# 1. April 2022- LLL ~4.0 cm mass in the superior segment left lower lobe abuts the major fissure and the posterior pleural surface without visible chest wall invasion without pleural effusion. There 2-3 other small nodules in the left lower lobe, largest measures 9 mm in diameter. These are suspicious for same lobe satellite lesions and there is left hilar adenopathy. Assuming non-small cell lung cancer the appearance is compatible with T3 N1 M0 disease (stage IIIA).  # CAD [CABG 2016; Dr.Gollan]   Cancer of lower lobe of left lung ([place])  11/02/2020 Initial Diagnosis   Cancer of lower lobe of left lung ([place])   11/02/2020 Cancer Staging   Staging form: Lung, AJCC 8th Edition - Clinical: Stage IVA (cT3, cN1, cM1a) - Signed by Cammie Sickle, MD on 11/02/2020   11/02/2020 -  Chemotherapy    Patient is on Treatment Plan: LUNG NSCLC CARBOPLATIN + PACLITAXEL + PEMBROLIZUMAB Q21D X 4 CYCLES / PEMBROLIZUMAB MAINTENANCE Q21D         HISTORY OF PRESENTING ILLNESS:  Randall Ali. 80 y.o.  male history of smoking; and newly diagnosed lung cancer is here to review the pathology/PET scan/treatment plan.  The interim patient was evaluated by thoracic surgery.  Given the right  suprarenal nodule PET avid-awaiting biopsy next week.  Patient denies any worsening shortness of breath or cough.  Appetite is good.  Physically active.  He had been eating this morning.  No blood in cough.   Review of Systems  Constitutional: Positive for malaise/fatigue. Negative for chills, diaphoresis, fever and weight loss.  HENT: Negative for nosebleeds and sore throat.   Eyes: Negative for double vision.  Respiratory: Negative for cough, hemoptysis, sputum production, shortness of breath and wheezing.   Cardiovascular: Negative for chest pain, palpitations, orthopnea and leg swelling.  Gastrointestinal: Negative for abdominal pain, blood in stool, constipation, diarrhea, heartburn, melena, nausea and vomiting.  Genitourinary: Negative for dysuria, frequency and urgency.  Musculoskeletal: Positive for back pain and joint pain.  Skin: Negative.  Negative for itching and rash.  Neurological: Negative for dizziness, tingling, focal weakness, weakness and headaches.  Endo/Heme/Allergies: Does not bruise/bleed easily.  Psychiatric/Behavioral: Negative for depression. The patient is not nervous/anxious and does not have insomnia.      MEDICAL HISTORY:  Past Medical History:  Diagnosis Date  . Allergy    seasonal  . Arthritis    all over- in general   . CAD (coronary artery disease)    a. inferior wall MI 10/01 s/p PCI/DES to RCA; b. Myoview 3/16 neg for ischemia; c. LHC 8/16: ostLAD 80%, OM1 70%, OM2 70% x 2 lesions, mRCA 30%, dRCA 70% s/p 4-V CABG 01/24/15 (LIMA-LAD, VG- OM1, VG-OM2, VG-PDA)   . Cancer (HCC)    skin, melanoma  . Carotid artery disease ([place])    a. [place] 8/16: 1-39% bilateral ICA stenosis  . Cataract  removed  . Diastolic dysfunction    a. TTE 8/16: EF 55-60%, no RWMA, Gr1DD, calcified mitral annulus, mild biatrial enlargement  . Erectile dysfunction   . GERD (gastroesophageal reflux disease)   . History of elbow surgery   . History of hiatal hernia   . HLD  (hyperlipidemia)   . HTN (hypertension)   . Inferior myocardial infarction ([place]) 10/01   stent RCA  . Postoperative wound infection 02/02/2015  . Reflux esophagitis   . Sleep apnea 2017   CPAP at night    SURGICAL HISTORY: Past Surgical History:  Procedure Laterality Date  . arm surgery  2010  . BROW LIFT Bilateral 11/25/2019   Procedure: BROW PTOSIS REPAIR BILATERAL;  Surgeon: Karle Starch, MD;  Location: [place];  Service: Ophthalmology;  Laterality: Bilateral;  sleep apnea  . CARDIAC CATHETERIZATION  06/24/11  . CARDIAC CATHETERIZATION N/A 01/18/2015   Procedure: Left Heart Cath with coronary angiography;  Surgeon: Minna Merritts, MD;  Location: [place] [facility];  Service: Cardiovascular;  Laterality: N/A;  . CARDIAC CATHETERIZATION N/A 01/18/2015   Procedure: Intravascular Pressure Wire/FFR Study;  Surgeon: [name], MD;  Location: [place];  Service: Cardiovascular;  Laterality: N/A;  . CAROTID STENT  03/10/2011  . COLONOSCOPY  2010  . COLONOSCOPY  06/14/2014   Dr Hilarie Fredrickson  . CORONARY ARTERY BYPASS GRAFT N/A 01/24/2015   Procedure: CORONARY ARTERY BYPASS GRAFTING x 4 (LIMA-LAD, SVG-Int 1- Int 2, SVG-PD) ENDOSCOPIC GREATER SAPHENOUS VEIN HARVEST LEFT LEG;  Surgeon: [name], MD;  Location: [place] [facility];  Service: Open Heart Surgery;  Laterality: N/A;  . EMBOLECTOMY  06/15/2019   Procedure: EMBOLECTOMY;  Surgeon: Katha Cabal, MD;  Location: [facility];  Service: Vascular;;  right superficial femoral artery  . ENDARTERECTOMY FEMORAL Right 06/15/2019   Procedure: ENDARTERECTOMY FEMORAL;  Surgeon: Katha Cabal, MD;  Location: [facility];  Service: Vascular;  Laterality: Right;  common femoral profunda femoris superficial femoral  . ESOPHAGOGASTRODUODENOSCOPY (EGD) WITH PROPOFOL N/A 04/24/2016   Procedure: ESOPHAGOGASTRODUODENOSCOPY (EGD) WITH PROPOFOL;  Surgeon: Jerene Bears, MD;  Location: [facility];  Service: Gastroenterology;  Laterality:  N/A;  . EYE SURGERY     lasik 15 yrs. ago, cataracts removed - both eyes   . HAMMER TOE SURGERY     right toe  . INSERTION OF ILIAC STENT Right 06/15/2019   Procedure: INSERTION OF ILIAC STENT ( STENTING OF SFA/POP ARTERY );  Surgeon: Katha Cabal, MD;  Location: [facility];  Service: Vascular;  Laterality: Right;  angioplpasty and stent placement: right superficial femoral right tibiopopliteal trunk bilateral common iliac arteries  . LEFT HEART CATH AND CORONARY ANGIOGRAPHY Left 06/10/2017   Procedure: LEFT HEART CATH AND CORONARY ANGIOGRAPHY;  Surgeon: Minna Merritts, MD;  Location: [place] [facility];  Service: Cardiovascular;  Laterality: Left;  . LOWER EXTREMITY ANGIOGRAPHY Left 01/04/2019   Procedure: LOWER EXTREMITY ANGIOGRAPHY;  Surgeon: Katha Cabal, MD;  Location: [place] [facility];  Service: Cardiovascular;  Laterality: Left;  . LOWER EXTREMITY ANGIOGRAPHY Right 01/25/2019   Procedure: LOWER EXTREMITY ANGIOGRAPHY;  Surgeon: Katha Cabal, MD;  Location: [place] [facility];  Service: Cardiovascular;  Laterality: Right;  . NASAL SINUS SURGERY  2008   septpolasty, bilateral turbinate reduction  . SHOULDER ARTHROSCOPY  2012  . TEE WITHOUT CARDIOVERSION N/A 01/24/2015   Procedure: TRANSESOPHAGEAL ECHOCARDIOGRAM (TEE);  Surgeon: [name], MD;  Location: [place];  Service: Open Heart Surgery;  Laterality: N/A;  . [place]  . UPPER GI ENDOSCOPY  07/2014, 04-24-16   Dr Raquel James  . WRIST SURGERY  2011    SOCIAL HISTORY: Social History   Socioeconomic History  . Marital status: Single    Spouse name: Not on file  . Number of children: Not on file  . Years of education: 23  . Highest education level: Not on file  Occupational History  . Occupation: retired    Comment: Agricultural consultant  Tobacco Use  . Smoking status: Former Smoker    Packs/day: 2.50    Years: 40.00    Pack years: 100.00    Types: Cigarettes    Quit date: 03/24/2000    Years since  quitting: 20.6  . Smokeless tobacco: Never Used  Vaping Use  . Vaping Use: Never used  Substance and Sexual Activity  . Alcohol use: Yes    Comment: occ  . Drug use: No  . Sexual activity: Yes  Other Topics Concern  . Not on file  Social History Narrative   Singled; lives with son and dog    Occ: retired, back part time at [employer];    Activity: gym 4-5d/wk   Diet: good water, fruits/vegetables daily   Caffeine Use-yes      ------------------------------------       Car sales- retd; [employer]- retd; quit smoking 2001. Alcohol couple nights a week. Live in [place]. Daughter lives 10 mins.    Social Determinants of Health   Financial Resource Strain: Low Risk   . Difficulty of Paying Living Expenses: Not hard at all  Food Insecurity: No Food Insecurity  . Worried About Charity fundraiser in the Last Year: Never true  . Ran Out of Food in the Last Year: Never true  Transportation Needs: No Transportation Needs  . Lack of Transportation (Medical): No  . Lack of Transportation (Non-Medical): No  Physical Activity: Sufficiently Active  . Days of Exercise per Week: 7 days  . Minutes of Exercise per Session: 60 min  Stress: No Stress Concern Present  . Feeling of Stress : Not at all  Social Connections: Not on file  Intimate Partner Violence: Not At Risk  . Fear of Current or Ex-Partner: No  . Emotionally Abused: No  . Physically Abused: No  . Sexually Abused: No    FAMILY HISTORY: Family History  Problem Relation Age of Onset  . Hypertension Mother   . Heart disease Mother   . Hypertension Father   . Diabetes Father   . Heart disease Brother 52  . Cancer Paternal Grandfather   . Lymphoma Sister   . Colon cancer Neg Hx   . Prostate cancer Neg Hx   . Bladder Cancer Neg Hx   . Kidney cancer Neg Hx     ALLERGIES:  is allergic to flomax [tamsulosin].  MEDICATIONS:  Current Outpatient Medications  Medication Sig Dispense Refill  . Ascorbic Acid (VITAMIN C)  1000 MG tablet Take 1,000 mg by mouth daily.    [name] aspirin 81 MG EC tablet Take 81 mg by mouth daily.      . Calcium-Magnesium-Vitamin D (CALCIUM 1200+D3 PO) Take 1 tablet by mouth daily.    . Carboxymethylcellul-Glycerin (LUBRICATING EYE DROPS OP) Place 1 drop into both eyes daily as needed (irritation).    . Cholecalciferol (VITAMIN D3) 50 MCG (2000 UT) TABS Take 2,000 Units by mouth daily.    . Cyanocobalamin (B-12) 5000 MCG CAPS Take 5,000 mcg  by mouth daily.    [name] docusate sodium (COLACE) 100 MG capsule Take 100 mg by mouth at bedtime.    [name] ibuprofen (ADVIL) 200 MG tablet Take 400-600 mg by mouth every 6 (six) hours as needed for moderate pain.    [name] lidocaine-prilocaine (EMLA) cream Apply 30 -45 mins prior to port access. 30 g 0  . lisinopril (ZESTRIL) 5 MG tablet TAKE 1 TABLET BY MOUTH DAILY (Patient taking differently: Take 5 mg by mouth daily.) 90 tablet 3  . Melatonin 10 MG CAPS Take 10 mg by mouth at bedtime as needed (sleep).    . metoprolol succinate (TOPROL-XL) 25 MG 24 hr tablet TAKE 1/2 TABLET (12.5 MG) BY MOUTH DAILY (Patient taking differently: Take 12.5 mg by mouth daily.) 45 tablet 3  . Niacinamide-Zn-Cu-Methfo-Se-Cr (NICOTINAMIDE PO) Take 500 mg by mouth daily.    . nitroGLYCERIN (NITROSTAT) 0.4 MG SL tablet Place 1 tablet (0.4 mg total) under the tongue every 5 (five) minutes as needed for chest pain. 25 tablet 3  . NONFORMULARY OR COMPOUNDED ITEM Trimix (30/1/10)-(Pap/Phent/PGE)  Test Dose 3 53ml vials  Qty #3 Refills 0  [place] [phone] Fax [phone] 3 each 0  . ondansetron (ZOFRAN) 8 MG tablet One pill every 8 hours as needed for nausea/vomitting. 40 tablet 1  . pantoprazole (PROTONIX) 40 MG tablet Take 1 tablet (40 mg total) by mouth daily. (Patient taking differently: Take 40 mg by mouth at bedtime.) 90 tablet 3  . Polyethyl Glycol-Propyl Glycol (LUBRICANT EYE DROPS) 0.4-0.3 % SOLN Place 1-2 drops into both eyes 3 (three) times daily as needed  (burning eyes.).    [name] prochlorperazine (COMPAZINE) 10 MG tablet Take 1 tablet (10 mg total) by mouth every 6 (six) hours as needed for nausea or vomiting. 40 tablet 1  . simvastatin (ZOCOR) 40 MG tablet Take 1 tablet (40 mg total) by mouth at bedtime. 90 tablet 0  . traMADol (ULTRAM) 50 MG tablet TAKE 2 TABLETS BY MOUTH DAILY AS NEEDED (Patient taking differently: Take 100 mg by mouth daily.) 60 tablet 0  . Vitamin E 450 MG (1000 UT) CAPS Take 1,000 Units by mouth daily.    . Zinc 50 MG TABS Take 50 mg by mouth daily.     No current facility-administered medications for this visit.      [name]  PHYSICAL EXAMINATION: ECOG PERFORMANCE STATUS: 0 - Asymptomatic  Vitals:   11/02/20 1004  BP: (!) 118/55  Pulse: 73  Resp: 18  Temp: 99.4 F (37.4 C)  SpO2: 94%   Filed Weights   11/02/20 1004  Weight: 205 lb 5.7 oz (93.2 kg)    Physical Exam Constitutional:      Comments: Ambulatory independently; accompanied by his daughter  HENT:     Head: Normocephalic and atraumatic.     Mouth/Throat:     Pharynx: No oropharyngeal exudate.  Eyes:     Pupils: Pupils are equal, round, and reactive to light.  Cardiovascular:     Rate and Rhythm: Normal rate and regular rhythm.  Pulmonary:     Effort: No respiratory distress.     Breath sounds: No wheezing.  Abdominal:     General: Bowel sounds are normal. There is no distension.     Palpations: Abdomen is soft. There is no mass.     Tenderness: There is no abdominal tenderness. There is no guarding or rebound.  Musculoskeletal:        General: No tenderness. Normal range of motion.  Cervical back: Normal range of motion and neck supple.  Skin:    General: Skin is warm.  Neurological:     Mental Status: He is alert and oriented to person, place, and time.  Psychiatric:        Mood and Affect: Affect normal.      LABORATORY DATA:  I have reviewed the data as listed Lab Results  Component Value Date   WBC 8.9 10/18/2020   HGB 14.6  10/18/2020   HCT 43.6 10/18/2020   MCV 91.2 10/18/2020   PLT 253 10/18/2020   Recent Labs    03/15/20 0804 10/08/20 1147 10/12/20 1019  NA 140  --  137  K 4.5  --  4.4  CL 105  --  104  CO2 28  --  22  GLUCOSE 90  --  101*  BUN 24*  --  23  CREATININE 1.14 1.00 0.84  CALCIUM 9.6  --  9.1  GFRNONAA  --   --  >60  PROT 6.4  --  7.0  ALBUMIN 4.3  --  4.0  AST 11  --  16  ALT 14  --  16  ALKPHOS 51  --  56  BILITOT 0.6  --  0.7    RADIOGRAPHIC STUDIES: I have personally reviewed the radiological images as listed and agreed with the findings in the report. CT Chest W Contrast  Result Date: 10/08/2020 CLINICAL DATA:  Abnormal chest radiograph, left perihilar nodule. EXAM: CT CHEST WITH CONTRAST TECHNIQUE: Multidetector CT imaging of the chest was performed during intravenous contrast administration. CONTRAST:  42mL OMNIPAQUE IOHEXOL 300 MG/ML  SOLN COMPARISON:  Chest radiograph 10/02/2020 FINDINGS: Cardiovascular: Coronary, aortic arch, and branch vessel atherosclerotic vascular disease. Prior CABG. Mediastinum/Nodes: Left posterior hilar node 1.3 cm in short axis on image 83 series 2. Lungs/Pleura: Superior segment left lower lobe lung mass 4.0 by 3.3 by 3.9 cm, this abuts the major fissure and the posterior pleural surface, no compelling findings of chest wall invasion. Another tumor nodule in the left lower lobe abutting the major fissure measures 0.9 by 0.7 by 0.8 cm on image 90 series 3. A subpleural nodule in the left lower lobe on image 133 of series 3 measures 0.7 by 0.5 by 0.6 cm. Other mild subpleural nodularity is present in this vicinity as on image 122 of series 3. There is also some scarring in the posterior basal segment left lower lobe. A right upper lobe nodule with central calcification measures 0.6 cm in diameter on image 59 series 3, previously the same, overall benign appearance. Stable scarring inferiorly in the right middle lobe along the diaphragm. There is scarring or  subsegmental atelectasis in the posterior basal segment right lower lobe. Upper Abdomen: Hypodense hepatic lesions favor cysts and are not changed from 2018. Contracted gallbladder. No adrenal mass identified. Prominence of stool in the upper abdomen. Musculoskeletal: Thoracic spondylosis. Thoracic spine degenerative disc disease. IMPRESSION: 1. 4.0 cm mass in the superior segment left lower lobe abuts the major fissure and the posterior pleural surface without visible chest wall invasion without pleural effusion. There 2-3 other small nodules in the left lower lobe, largest measures 9 mm in diameter. These are suspicious for same lobe satellite lesions and there is left hilar adenopathy. Assuming non-small cell lung cancer the appearance is compatible with T3 N1 M0 disease (stage IIIA). 2. Aortic Atherosclerosis (ICD10-I70.0). Coronary atherosclerosis with prior CABG. 3. Chronically stable and benign right upper lobe nodule 0.6 cm in diameter.  4. Hepatic cysts. 5. Prominence of stool in the upper abdomen raise the possibility of constipation. 6. Thoracic spondylosis. Electronically Signed   By: Van Clines M.D.   On: 10/08/2020 12:30   MR Brain W Wo Contrast  Result Date: 10/29/2020 CLINICAL DATA:  Metastatic disease evaluation. New diagnosis of lung cancer. EXAM: MRI HEAD WITHOUT AND WITH CONTRAST TECHNIQUE: Multiplanar, multiecho pulse sequences of the brain and surrounding structures were obtained without and with intravenous contrast. CONTRAST:  38mL GADAVIST GADOBUTROL 1 MMOL/ML IV SOLN COMPARISON:  MRI head September 23, 2016. FINDINGS: Brain: No acute infarction, hemorrhage, hydrocephalus, extra-axial collection or mass lesion. No substantial change in scattered T2/FLAIR hyperintensities within the white matter, most likely related to chronic microvascular ischemic disease in this patient with multiple risk factors. Vascular: Major arterial flow voids are maintained at the skull base. Skull and upper  cervical spine: Normal marrow signal. Sinuses/Orbits: Mild ethmoid air cell mucosal thickening. Otherwise, visualized sinuses are clear. No air-fluid levels. Unremarkable orbits. Other: No sizable mastoid effusions. IMPRESSION: No evidence of acute intracranial abnormality or metastatic disease. Electronically Signed   By: Margaretha Sheffield MD   On: 10/29/2020 10:10   NM PET Image Initial (PI) Skull Base To Thigh  Result Date: 10/18/2020 CLINICAL DATA:  Initial treatment strategy for pulmonary mass. EXAM: NUCLEAR MEDICINE PET SKULL BASE TO THIGH TECHNIQUE: 10.9 mCi F-18 FDG was injected intravenously. Full-ring PET imaging was performed from the skull base to thigh after the radiotracer. CT data was obtained and used for attenuation correction and anatomic localization. Fasting blood glucose: 96 mg/dl COMPARISON:  CT 10/08/2020 FINDINGS: Mediastinal blood pool activity: SUV max 2.2 Liver activity: SUV max 3.1 NECK: No hypermetabolic lymph nodes in the neck. Incidental CT findings: none CHEST: IN superior segment aspect of the LEFT lower lobe rounded mass measures 4.4 x 3.6 cm and shares a broad surface with the pleural. Mass is intensely hypermetabolic with SUV max equal 10.7 (image 107) A small nodule along the oblique fissure in the same LEFT lower lobe measures 0.9 cm (image 111) with faint metabolic activity (SUV max equal 1.8). There is intense hypermetabolic LEFT hilar lymph node measuring approximately 2.0 cm with SUV max equal 9.7 on image 104. No hypermetabolic mediastinal lymph nodes. No hypermetabolic supraclavicular nodes. Within the RIGHT lung, there is a calcified nodule in the RIGHT middle lobe (image 95/3). No suspicious nodularity. Incidental CT findings: Post CABG. ABDOMEN/PELVIS: In the RIGHT suprarenal space, there is a rounded nodule measuring 11 mm (image 155/series 3). This nodule is hypermetabolic with SUV max equal 6.11 on image 151. There is misregistration between the PET data set in  the [place] set. interestingly, this nodule measured only 4 mm on CT 8 days prior (155/series 2). Incidental CT findings: Atherosclerotic calcification of the aorta. SKELETON: No focal hypermetabolic activity to suggest skeletal metastasis. Incidental CT findings: none IMPRESSION: 1. Hypermetabolic mass in the superior segment of the LEFT lower lobe consistent primary bronchogenic carcinoma. 2. Hypermetabolic nodal metastasis to the LEFT hilum. 3. Faint hypermetabolic nodule within the same LEFT lower lobe along the fissure is concerning for a metastatic lesion. 4. Enlarging nodule within the RIGHT suprarenal space concerning for a metastatic lesion. Electronically Signed   By: Suzy Bouchard M.D.   On: 10/18/2020 11:21   DG Chest Port 1 View  Result Date: 10/18/2020 CLINICAL DATA:  80 year old male 1 hour status post CT-guided left lower lobe lung mass biopsy. EXAM: PORTABLE CHEST - 1 VIEW COMPARISON:  10/02/2020,  10/08/2020, 10/17/2020, 10/18/2020 FINDINGS: The mediastinal contours are within normal limits. No cardiomegaly. Ill-defined, rounded opacity projecting just superior to the left hilum compatible with known left lower lobe mass. Faint density increased from comparison radiograph, however unchanged from postprocedural imaging after biopsy. No new focal consolidations. No pleural effusion or pneumothorax. Sternotomy wires remain in place. No acute osseous abnormality. IMPRESSION: Stable small left lower lobe pulmonary hemorrhage after CT-guided lung mass biopsy. No pneumothorax. Ruthann Cancer, MD [facility] Radiology Electronically Signed   By: Ruthann Cancer MD   On: 10/18/2020 11:45   Pulmonary Function Test [facility] Only  Result Date: 10/18/2020 Spirometry Data Is Acceptable and Reproducible No obvious evidence of Obstructive Airways disease or Restrictive Lung disease Consider outpatient follow up with Pulmonary if needed. Clinical Correlation  Advised   CT LUNG MASS BIOPSY  Result Date: 10/18/2020 INDICATION: 80 year old male with indeterminate left lower lobe mass. EXAM: CT-guided lung biopsy COMPARISON:  None. MEDICATIONS: None. ANESTHESIA/SEDATION: Fentanyl 100 mcg IV; Versed 2 mg IV Sedation time: 16 minutes; The patient was continuously monitored during the procedure by the interventional radiology nurse under my direct supervision. CONTRAST:  None COMPLICATIONS: SIR Level A - No therapy, no consequence. Self limited pulmonary hemorrhage after biopsy. PROCEDURE: Informed consent was obtained from the patient following an explanation of the procedure, risks, benefits and alternatives. The patient understands,agrees and consents for the procedure. All questions were addressed. A time out was performed prior to the initiation of the procedure. The patient was positioned in the left lateral decubitus position on the CT table and a limited chest CT was performed for procedural planning demonstrating similar appearing 4.1 cm subpleural, posterior left lower lobe mass. The operative site was prepped and draped in the usual sterile fashion. Under sterile conditions and local anesthesia, a 17 gauge coaxial needle was advanced into the peripheral aspect of the nodule. Positioning was confirmed with intermittent CT fluoroscopy and followed by the acquisition of a total of 3 samples with an 18 gauge core needle biopsy device. The coaxial needle was removed following deployment of a [employer] plug and superficial hemostasis was achieved with manual compression. Limited post procedural chest CT was negative for pneumothorax but did demonstrate pulmonary hemorrhage measuring up to approximately 5.2 cm in maximum axial dimension, increased from 5.1 cm 4 minutes prior. A dressing was placed. The patient tolerated the procedure well without immediate postprocedural complication. The patient was escorted to have an upright chest radiograph. IMPRESSION: Technically  successful CT guided core needle core biopsy of left lower lobe lung mass. Ruthann Cancer, MD [facility] Radiology Electronically Signed   By: Ruthann Cancer MD   On: 10/18/2020 10:53    ASSESSMENT & PLAN:   Cancer of lower lobe of left lung ([place]) #Lung cancer-non-small cell-T3N1 vs stage IV [right suprarenal nodule-awaiting biopsy on 5/31].  MRI brain negative for malignancy. Order NGS  #Discussed with the patient/daughter-the pathology/and above staging.  They understand if the right suprarenal nodule-positive for cancer on biopsy-stage IV; the chemoimmunotherapy treatments are palliative.   Discussed the potential side effects including but not limited to-increasing fatigue, nausea vomiting, diarrhea, hair loss, sores in the mouth, increase risk of infection and also neuropathy.  Also discussed the mechanism of action of immunotherapy-potential risk of autoimmune side effects-including but not limited to thyroid; pneumonitis; colitis etc.   #Would recommend staging scan after 4 cycles-patient has excellent response-surgery could be recommended.  Discussed with Dr. Roxan Hockey.  #  Chemotherapy education; port placement. Hopefully the planned start chemotherapy in 2 weeks Antiemetics-Zofran and Compazine; EMLA cream sent to pharmacy  # CAD-s/p CABG.  No CHF.  Stable.  Burling=Meb # DISPOSITION: # IR for port placement ASAP # Chemo ed- ASAP- [carbo-taxol-keytruda] # follow up on 06//10- MD[[place]]- labs- cbc/cmp; carbo-taxol-Keytruda;06-13-fulphila Dr.B  # 40 minutes face-to-face with the patient discussing the above plan of care; more than 50% of time spent on prognosis/ natural history; counseling and coordination.   # I reviewed the blood work- with the patient in detail; also reviewed the imaging independently [as summarized above]; and with the patient in detail.      All  questions were answered. The patient knows to call the  clinic with any problems, questions or concerns.    Cammie Sickle, MD 11/02/2020 11:04 AM

## 2020-11-02 NOTE — Progress Notes (Signed)
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

## 2020-11-06 ENCOUNTER — Ambulatory Visit (HOSPITAL_COMMUNITY)
Admission: RE | Admit: 2020-11-06 | Discharge: 2020-11-06 | Disposition: A | Discharge status: Home / Self Care | Payer: PPO | Source: Ambulatory Visit | Attending: Thoracic Surgery (Cardiothoracic Vascular Surgery) | Admitting: Thoracic Surgery (Cardiothoracic Vascular Surgery)

## 2020-11-06 ENCOUNTER — Other Ambulatory Visit: Payer: Self-pay

## 2020-11-06 ENCOUNTER — Encounter: Payer: Self-pay | Admitting: *Deleted

## 2020-11-06 DIAGNOSIS — Z79899 Other long term (current) drug therapy: Secondary | ICD-10-CM | POA: Diagnosis not present

## 2020-11-06 DIAGNOSIS — I2510 Atherosclerotic heart disease of native coronary artery without angina pectoris: Secondary | ICD-10-CM | POA: Insufficient documentation

## 2020-11-06 DIAGNOSIS — C801 Malignant (primary) neoplasm, unspecified: Secondary | ICD-10-CM | POA: Diagnosis not present

## 2020-11-06 DIAGNOSIS — Z7901 Long term (current) use of anticoagulants: Secondary | ICD-10-CM | POA: Insufficient documentation

## 2020-11-06 DIAGNOSIS — Z7982 Long term (current) use of aspirin: Secondary | ICD-10-CM | POA: Diagnosis not present

## 2020-11-06 DIAGNOSIS — Z951 Presence of aortocoronary bypass graft: Secondary | ICD-10-CM | POA: Diagnosis not present

## 2020-11-06 DIAGNOSIS — C649 Malignant neoplasm of unspecified kidney, except renal pelvis: Secondary | ICD-10-CM | POA: Diagnosis not present

## 2020-11-06 DIAGNOSIS — C7802 Secondary malignant neoplasm of left lung: Secondary | ICD-10-CM | POA: Diagnosis not present

## 2020-11-06 DIAGNOSIS — K219 Gastro-esophageal reflux disease without esophagitis: Secondary | ICD-10-CM | POA: Insufficient documentation

## 2020-11-06 DIAGNOSIS — R918 Other nonspecific abnormal finding of lung field: Secondary | ICD-10-CM

## 2020-11-06 DIAGNOSIS — R1909 Other intra-abdominal and pelvic swelling, mass and lump: Secondary | ICD-10-CM | POA: Insufficient documentation

## 2020-11-06 DIAGNOSIS — G4733 Obstructive sleep apnea (adult) (pediatric): Secondary | ICD-10-CM | POA: Diagnosis not present

## 2020-11-06 DIAGNOSIS — C786 Secondary malignant neoplasm of retroperitoneum and peritoneum: Secondary | ICD-10-CM | POA: Diagnosis not present

## 2020-11-06 DIAGNOSIS — I10 Essential (primary) hypertension: Secondary | ICD-10-CM | POA: Diagnosis not present

## 2020-11-06 DIAGNOSIS — I252 Old myocardial infarction: Secondary | ICD-10-CM | POA: Diagnosis not present

## 2020-11-06 LAB — CBC
HCT: 43.4 % (ref 39.0–52.0)
Hemoglobin: 14.3 g/dL (ref 13.0–17.0)
MCH: 30.8 pg (ref 26.0–34.0)
MCHC: 32.9 g/dL (ref 30.0–36.0)
MCV: 93.5 fL (ref 80.0–100.0)
Platelets: 220 10*3/uL (ref 150–400)
RBC: 4.64 MIL/uL (ref 4.22–5.81)
RDW: 12.7 % (ref 11.5–15.5)
WBC: 11.6 10*3/uL — ABNORMAL HIGH (ref 4.0–10.5)
nRBC: 0 % (ref 0.0–0.2)

## 2020-11-06 LAB — PROTIME-INR
INR: 1.1 (ref 0.8–1.2)
Prothrombin Time: 14 seconds (ref 11.4–15.2)

## 2020-11-06 MED ORDER — MIDAZOLAM HCL 2 MG/2ML IJ SOLN
INTRAMUSCULAR | Status: AC
  Filled 2020-11-06: qty 4

## 2020-11-06 MED ORDER — MIDAZOLAM HCL 2 MG/2ML IJ SOLN
INTRAMUSCULAR | Status: AC | PRN
  Administered 2020-11-06: 1 mg via INTRAVENOUS
  Administered 2020-11-06: 0.5 mg via INTRAVENOUS

## 2020-11-06 MED ORDER — FENTANYL CITRATE (PF) 100 MCG/2ML IJ SOLN
INTRAMUSCULAR | Status: AC
  Filled 2020-11-06: qty 4

## 2020-11-06 MED ORDER — SODIUM CHLORIDE 0.9 % IV SOLN
INTRAVENOUS | Status: DC
Start: 2020-11-06 — End: 2020-11-07

## 2020-11-06 MED ORDER — FENTANYL CITRATE (PF) 100 MCG/2ML IJ SOLN
INTRAMUSCULAR | Status: AC | PRN
  Administered 2020-11-06: 25 ug via INTRAVENOUS
  Administered 2020-11-06: 50 ug via INTRAVENOUS

## 2020-11-06 MED ORDER — LIDOCAINE HCL 1 % IJ SOLN
INTRAMUSCULAR | Status: AC
  Filled 2020-11-06: qty 20

## 2020-11-06 NOTE — Discharge Instructions (Addendum)
Moderate Conscious Sedation, Adult Sedation is the use of medicines to promote relaxation and to relieve discomfort and anxiety. Moderate conscious sedation is a type of sedation. Under moderate conscious sedation, you are less alert than normal, but you are still able to respond to instructions, touch, or both. Moderate conscious sedation is used during short medical and dental procedures. It is milder than deep sedation, which is a type of sedation under which you cannot be easily woken up. It is also milder than general anesthesia, which is the use of medicines to make you unconscious. Moderate conscious sedation allows you to return to your regular activities sooner. Tell a health care provider about:  Any allergies you have.  All medicines you are taking, including vitamins, herbs, eye drops, creams, and over-the-counter medicines.  Any use of steroids. This includes steroids taken by mouth or as a cream.  Any problems you or family members have had with sedatives and anesthetic medicines.  Any blood disorders you have.  Any surgeries you have had.  Any medical conditions you have, such as sleep apnea.  Whether you are pregnant or may be pregnant.  Any use of cigarettes, alcohol, marijuana, or drugs. What are the risks? Generally, this is a safe procedure. However, problems may occur, including:  Getting too much medicine (oversedation).  Nausea.  Allergic reaction to medicines.  Trouble breathing. If this happens, a breathing tube may be used. It will be removed when you are awake and breathing on your own.  Heart trouble.  Lung trouble.  Confusion that gets better with time (emergence delirium). What happens before the procedure? Staying hydrated Follow instructions from your health care provider about hydration, which may include:  Up to 2 hours before the procedure - you may continue to drink clear liquids, such as water, clear fruit juice, black coffee, and plain  tea. Eating and drinking restrictions Follow instructions from your health care provider about eating and drinking, which may include:  8 hours before the procedure - stop eating heavy meals or foods, such as meat, fried foods, or fatty foods.  6 hours before the procedure - stop eating light meals or foods, such as toast or cereal.  6 hours before the procedure - stop drinking milk or drinks that contain milk.  2 hours before the procedure - stop drinking clear liquids. Medicines Ask your health care provider about:  Changing or stopping your regular medicines. This is especially important if you are taking diabetes medicines or blood thinners.  Taking medicines such as aspirin and ibuprofen. These medicines can thin your blood. Do not take these medicines unless your health care provider tells you to take them.  Taking over-the-counter medicines, vitamins, herbs, and supplements. Tests and exams  You will have a physical exam.  You may have blood tests done to show how well: ? Your kidneys and liver work. ? Your blood clots. General instructions  Plan to have a responsible adult take you home from the hospital or clinic.  If you will be going home right after the procedure, plan to have a responsible adult care for you for the time you are told. This is important. What happens during the procedure?  You will be given the sedative. The sedative may be given: ? As a pill that you will swallow. It can also be inserted into the rectum. ? As a spray through the nose. ? As an injection into the muscle. ? As an injection into the vein through an IV.  You may be given oxygen as needed.  Your breathing, heart rate, and blood pressure will be monitored during the procedure.  The medical or dental procedure will be done. The procedure may vary among health care providers and hospitals.   What happens after the procedure?  Your blood pressure, heart rate, breathing rate, and  blood oxygen level will be monitored until you leave the hospital or clinic.  You will get fluids through your IV if needed.  Do not drive or operate machinery until your health care provider says that it is safe. Summary  Sedation is the use of medicines to promote relaxation and to relieve discomfort and anxiety. Moderate conscious sedation is a type of sedation that is used during short medical and dental procedures.  Tell the health care provider about any medical conditions that you have and about all the medicines that you are taking.  You will be given the sedative as a pill, a spray through the nose, an injection into the muscle, or an injection into the vein through an IV. Vital signs are monitored during the sedation.  Moderate conscious sedation allows you to return to your regular activities sooner. This information is not intended to replace advice given to you by your health care provider. Make sure you discuss any questions you have with your health care provider. Document Revised: 09/23/2019 Document Reviewed: 04/21/2019 [employer]vier Patient Education  2021 [employer].

## 2020-11-06 NOTE — H&P (Signed)
Chief Complaint: Patient was seen in consultation today for right suprarenal nodule biopsy.  Referring Physician(s): Hendrickson,Steven C  Supervising Physician: Mir, Biochemist, clinical  Patient Status: [facility] - Out-pt  History of Present Illness: Randall Ali. is a 79 y.o. male with a past medical history significant for OSA, GERD, HTN, HLD, MI, CAD s/p CABG and recently diagnosed lung cancer who presents today for a right suprarenal biopsy with moderate sedation.  Randall Ali was recently found to have a LLL mass after work up for a persistent cold in April, this mass was biopsied in IR on 10/18/20 with pathology showing non-small cell carcinoma. He was referred to medical oncology for further management and underwent PET scan on 10/18/20 which showed a hypermetabolic mass in the superior segment of the LLL, hypermetabolic nodal metastasis to the left hilum and an enlarging nodule within the right suprarenal space concerning for metastatic lesion. IR has been asked to biopsy the right suprarenal nodule for staging purposes.  Patient denies any complaints today, he has some pain but nothing new. He did well after his recent lung biopsy. He understands the procedure today and is agreeable to proceed.  Past Medical History:  Diagnosis Date  . Allergy    seasonal  . Arthritis    all over- in general   . CAD (coronary artery disease)    a. inferior wall MI 10/01 s/p PCI/DES to RCA; b. Myoview 3/16 neg for ischemia; c. LHC 8/16: ostLAD 80%, OM1 70%, OM2 70% x 2 lesions, mRCA 30%, dRCA 70% s/p 4-V CABG 01/24/15 (LIMA-LAD, VG- OM1, VG-OM2, VG-PDA)   . Cancer (HCC)    skin, melanoma  . Carotid artery disease ([place])    a. [place] 8/16: 1-39% bilateral ICA stenosis  . Cataract    removed  . Diastolic dysfunction    a. TTE 8/16: EF 55-60%, no RWMA, Gr1DD, calcified mitral annulus, mild biatrial enlargement  . Erectile dysfunction   . GERD (gastroesophageal reflux disease)   . History of elbow surgery   .  History of hiatal hernia   . HLD (hyperlipidemia)   . HTN (hypertension)   . Inferior myocardial infarction ([place]) 10/01   stent RCA  . Postoperative wound infection 02/02/2015  . Reflux esophagitis   . Sleep apnea 2017   CPAP at night    Past Surgical History:  Procedure Laterality Date  . arm surgery  2010  . BROW LIFT Bilateral 11/25/2019   Procedure: BROW PTOSIS REPAIR BILATERAL;  Surgeon: Karle Starch, MD;  Location: [place];  Service: Ophthalmology;  Laterality: Bilateral;  sleep apnea  . CARDIAC CATHETERIZATION  06/24/11  . CARDIAC CATHETERIZATION N/A 01/18/2015   Procedure: Left Heart Cath with coronary angiography;  Surgeon: Minna Merritts, MD;  Location: [place] [facility];  Service: Cardiovascular;  Laterality: N/A;  . CARDIAC CATHETERIZATION N/A 01/18/2015   Procedure: Intravascular Pressure Wire/FFR Study;  Surgeon: [name], MD;  Location: [place] [facility];  Service: Cardiovascular;  Laterality: N/A;  . CAROTID STENT  03/10/2011  . COLONOSCOPY  2010  . COLONOSCOPY  06/14/2014   Dr Hilarie Fredrickson  . CORONARY ARTERY BYPASS GRAFT N/A 01/24/2015   Procedure: CORONARY ARTERY BYPASS GRAFTING x 4 (LIMA-LAD, SVG-Int 1- Int 2, SVG-PD) ENDOSCOPIC GREATER SAPHENOUS VEIN HARVEST LEFT LEG;  Surgeon: [name], MD;  Location: [place];  Service: Open Heart Surgery;  Laterality: N/A;  . EMBOLECTOMY  06/15/2019   Procedure: EMBOLECTOMY;  Surgeon: Katha Cabal, MD;  Location: [facility];  Service: Vascular;;  right superficial femoral artery  . ENDARTERECTOMY FEMORAL Right 06/15/2019   Procedure: ENDARTERECTOMY FEMORAL;  Surgeon: Katha Cabal, MD;  Location: [facility];  Service: Vascular;  Laterality: Right;  common femoral profunda femoris superficial femoral  . ESOPHAGOGASTRODUODENOSCOPY (EGD) WITH PROPOFOL N/A 04/24/2016   Procedure: ESOPHAGOGASTRODUODENOSCOPY (EGD) WITH PROPOFOL;  Surgeon: Jerene Bears, MD;  Location: [facility];  Service:  Gastroenterology;  Laterality: N/A;  . EYE SURGERY     lasik 15 yrs. ago, cataracts removed - both eyes   . HAMMER TOE SURGERY     right toe  . INSERTION OF ILIAC STENT Right 06/15/2019   Procedure: INSERTION OF ILIAC STENT ( STENTING OF SFA/POP ARTERY );  Surgeon: Katha Cabal, MD;  Location: [facility];  Service: Vascular;  Laterality: Right;  angioplpasty and stent placement: right superficial femoral right tibiopopliteal trunk bilateral common iliac arteries  . LEFT HEART CATH AND CORONARY ANGIOGRAPHY Left 06/10/2017   Procedure: LEFT HEART CATH AND CORONARY ANGIOGRAPHY;  Surgeon: Minna Merritts, MD;  Location: [place] [facility];  Service: Cardiovascular;  Laterality: Left;  . LOWER EXTREMITY ANGIOGRAPHY Left 01/04/2019   Procedure: LOWER EXTREMITY ANGIOGRAPHY;  Surgeon: Katha Cabal, MD;  Location: [place] [facility];  Service: Cardiovascular;  Laterality: Left;  . LOWER EXTREMITY ANGIOGRAPHY Right 01/25/2019   Procedure: LOWER EXTREMITY ANGIOGRAPHY;  Surgeon: Katha Cabal, MD;  Location: [place] [facility];  Service: Cardiovascular;  Laterality: Right;  . NASAL SINUS SURGERY  2008   septpolasty, bilateral turbinate reduction  . SHOULDER ARTHROSCOPY  2012  . TEE WITHOUT CARDIOVERSION N/A 01/24/2015   Procedure: TRANSESOPHAGEAL ECHOCARDIOGRAM (TEE);  Surgeon: [name], MD;  Location: [place];  Service: Open Heart Surgery;  Laterality: N/A;  . [place]  . UPPER GI ENDOSCOPY  07/2014, 04-24-16   Dr Raquel James  . WRIST SURGERY  2011    Allergies: Flomax [tamsulosin]  Medications: Prior to Admission medications   Medication Sig Start Date End Date Taking? Authorizing Provider  Ascorbic Acid (VITAMIN C) 1000 MG tablet Take 1,000 mg by mouth daily.   Yes [provider]  aspirin 81 MG EC tablet Take 81 mg by mouth daily.     Yes [provider]  Calcium-Magnesium-Vitamin D (CALCIUM 1200+D3 PO) Take 1 tablet by mouth daily.   Yes  [provider]  Carboxymethylcellul-Glycerin (LUBRICATING EYE DROPS OP) Place 1 drop into both eyes daily as needed (irritation).   Yes [provider]  Cholecalciferol (VITAMIN D3) 50 MCG (2000 UT) TABS Take 2,000 Units by mouth daily.   Yes [provider]  Cyanocobalamin (B-12) 5000 MCG CAPS Take 5,000 mcg by mouth daily.   Yes [provider]  docusate sodium (COLACE) 100 MG capsule Take 100 mg by mouth at bedtime.   Yes [provider]  ibuprofen (ADVIL) 200 MG tablet Take 400-600 mg by mouth every 6 (six) hours as needed for moderate pain.   Yes [provider]  lisinopril (ZESTRIL) 5 MG tablet TAKE 1 TABLET BY MOUTH DAILY Patient taking differently: Take 5 mg by mouth daily. 04/27/20  Yes Ria Bush, MD  Melatonin 10 MG CAPS Take 10 mg by mouth at bedtime as needed (sleep).   Yes [provider]  metoprolol succinate (TOPROL-XL) 25 MG 24 hr tablet TAKE 1/2 TABLET (12.5 MG) BY MOUTH DAILY Patient taking differently: Take 12.5 mg by mouth daily. 05/21/20  Yes Ria Bush, MD  Niacinamide-Zn-Cu-Methfo-Se-Cr (NICOTINAMIDE PO) Take 500 mg by mouth daily.   Yes [provider]  pantoprazole (PROTONIX) 40 MG tablet Take 1 tablet (40 mg total) by mouth daily. Patient taking differently: Take 40 mg by mouth at bedtime. 01/04/20  Yes Ria Bush, MD  Polyethyl Glycol-Propyl Glycol (LUBRICANT EYE DROPS) 0.4-0.3 % SOLN Place 1-2 drops into both eyes 3 (three) times daily as needed (burning eyes.).   Yes [provider]  simvastatin (ZOCOR) 40 MG tablet Take 1 tablet (40 mg total) by mouth at bedtime. 01/10/20  Yes Gollan, [name], MD  traMADol (ULTRAM) 50 MG tablet TAKE 2 TABLETS BY MOUTH DAILY AS NEEDED Patient taking differently: Take 100 mg by mouth daily. 10/10/20  Yes Ria Bush, MD  Vitamin E 450 MG (1000 UT) CAPS Take 1,000 Units by mouth daily.   Yes [provider]  Zinc 50 MG TABS  Take 50 mg by mouth daily.   Yes [provider]  lidocaine-prilocaine (EMLA) cream Apply 30 -45 mins prior to port access. 11/02/20   Cammie Sickle, MD  nitroGLYCERIN (NITROSTAT) 0.4 MG SL tablet Place 1 tablet (0.4 mg total) under the tongue every 5 (five) minutes as needed for chest pain. 06/16/16   Minna Merritts, MD  NONFORMULARY OR COMPOUNDED ITEM Trimix (30/1/10)-(Pap/Phent/PGE)  Test Dose 3 1ml vials  Qty #3 [place] [phone] Fax [phone] 01/24/19   Festus Aloe, MD  ondansetron [facility]) 8 MG tablet One pill every 8 hours as needed for nausea/vomitting. 11/02/20   Cammie Sickle, MD  prochlorperazine (COMPAZINE) 10 MG tablet Take 1 tablet (10 mg total) by mouth every 6 (six) hours as needed for nausea or vomiting. 11/02/20   Cammie Sickle, MD     Family History  Problem Relation Age of Onset  . Hypertension Mother   . Heart disease Mother   . Hypertension Father   . Diabetes Father   . Heart disease Brother 46  . Cancer Paternal Grandfather   . Lymphoma Sister   . Colon cancer Neg Hx   . Prostate cancer Neg Hx   . Bladder Cancer Neg Hx   . Kidney cancer Neg Hx     Social History   Socioeconomic History  . Marital status: Single    Spouse name: Not on file  . Number of children: Not on file  . Years of education: 38  . Highest education level: Not on file  Occupational History  . Occupation: retired    Comment: Agricultural consultant  Tobacco Use  . Smoking status: Former Smoker    Packs/day: 2.50    Years: 40.00    Pack years: 100.00    Types: Cigarettes    Quit date: 03/24/2000    Years since quitting: 20.6  . Smokeless tobacco: Never Used  Vaping Use  . Vaping Use: Never used  Substance and Sexual Activity  . Alcohol use: Yes    Comment: occ  . Drug use: No  . Sexual activity: Yes  Other Topics Concern  . Not on file  Social History Narrative   Singled; lives with son and dog    Occ: retired,  back part time at [employer];    Activity: gym 4-5d/wk   Diet: good water, fruits/vegetables daily   Caffeine Use-yes      ------------------------------------       Car sales- retd; [employer]- retd; quit smoking 2001. Alcohol couple nights a week. Live in [place]. Daughter lives  10 mins.    Social Determinants of Health   Financial Resource Strain: Low Risk   . Difficulty of Paying Living Expenses: Not hard at all  Food Insecurity: No Food Insecurity  . Worried About Charity fundraiser in the Last Year: Never true  . Ran Out of Food in the Last Year: Never true  Transportation Needs: No Transportation Needs  . Lack of Transportation (Medical): No  . Lack of Transportation (Non-Medical): No  Physical Activity: Sufficiently Active  . Days of Exercise per Week: 7 days  . Minutes of Exercise per Session: 60 min  Stress: No Stress Concern Present  . Feeling of Stress : Not at all  Social Connections: Not on file     Review of Systems: A 12 point ROS discussed and pertinent positives are indicated in the HPI above.  All other systems are negative.  Review of Systems  Constitutional: Negative for chills and fever.  Respiratory: Negative for cough and shortness of breath.   Cardiovascular: Negative for chest pain.  Gastrointestinal: Negative for abdominal pain, diarrhea, nausea and vomiting.  Genitourinary: Negative for dysuria and hematuria.  Neurological: Negative for dizziness and headaches.    Vital Signs: BP 134/74   Pulse 74   Temp 97.9 F (36.6 C)   Ht 5\' 8"  (1.727 m)   Wt 200 lb (90.7 kg)   SpO2 98%   BMI 30.41 kg/m   Physical Exam Vitals reviewed.  Constitutional:      General: He is not in acute distress. HENT:     Head: Normocephalic.     Mouth/Throat:     Mouth: Mucous membranes are moist.     Pharynx: Oropharynx is clear. No oropharyngeal exudate or posterior oropharyngeal erythema.  Cardiovascular:     Rate and Rhythm: Normal rate and regular  rhythm.  Pulmonary:     Effort: Pulmonary effort is normal.     Breath sounds: Normal breath sounds.  Abdominal:     General: There is no distension.     Palpations: Abdomen is soft.     Tenderness: There is no abdominal tenderness.  Skin:    General: Skin is warm and dry.  Neurological:     Mental Status: He is alert and oriented to person, place, and time.  Psychiatric:        Mood and Affect: Mood normal.        Behavior: Behavior normal.        Thought Content: Thought content normal.        Judgment: Judgment normal.      MD Evaluation Airway: WNL Heart: WNL Abdomen: WNL Chest/ Lungs: WNL ASA  Classification: 3 Mallampati/Airway Score: Two   Imaging: CT Chest W Contrast  Result Date: 10/08/2020 CLINICAL DATA:  Abnormal chest radiograph, left perihilar nodule. EXAM: CT CHEST WITH CONTRAST TECHNIQUE: Multidetector CT imaging of the chest was performed during intravenous contrast administration. CONTRAST:  44mL OMNIPAQUE IOHEXOL 300 MG/ML  SOLN COMPARISON:  Chest radiograph 10/02/2020 FINDINGS: Cardiovascular: Coronary, aortic arch, and branch vessel atherosclerotic vascular disease. Prior CABG. Mediastinum/Nodes: Left posterior hilar node 1.3 cm in short axis on image 83 series 2. Lungs/Pleura: Superior segment left lower lobe lung mass 4.0 by 3.3 by 3.9 cm, this abuts the major fissure and the posterior pleural surface, no compelling findings of chest wall invasion. Another tumor nodule in the left lower lobe abutting the major fissure measures 0.9 by 0.7 by 0.8 cm on image 90 series 3. A subpleural nodule in  the left lower lobe on image 133 of series 3 measures 0.7 by 0.5 by 0.6 cm. Other mild subpleural nodularity is present in this vicinity as on image 122 of series 3. There is also some scarring in the posterior basal segment left lower lobe. A right upper lobe nodule with central calcification measures 0.6 cm in diameter on image 59 series 3, previously the same, overall  benign appearance. Stable scarring inferiorly in the right middle lobe along the diaphragm. There is scarring or subsegmental atelectasis in the posterior basal segment right lower lobe. Upper Abdomen: Hypodense hepatic lesions favor cysts and are not changed from 2018. Contracted gallbladder. No adrenal mass identified. Prominence of stool in the upper abdomen. Musculoskeletal: Thoracic spondylosis. Thoracic spine degenerative disc disease. IMPRESSION: 1. 4.0 cm mass in the superior segment left lower lobe abuts the major fissure and the posterior pleural surface without visible chest wall invasion without pleural effusion. There 2-3 other small nodules in the left lower lobe, largest measures 9 mm in diameter. These are suspicious for same lobe satellite lesions and there is left hilar adenopathy. Assuming non-small cell lung cancer the appearance is compatible with T3 N1 M0 disease (stage IIIA). 2. Aortic Atherosclerosis (ICD10-I70.0). Coronary atherosclerosis with prior CABG. 3. Chronically stable and benign right upper lobe nodule 0.6 cm in diameter. 4. Hepatic cysts. 5. Prominence of stool in the upper abdomen raise the possibility of constipation. 6. Thoracic spondylosis. Electronically Signed   By: Van Clines M.D.   On: 10/08/2020 12:30   MR Brain W Wo Contrast  Result Date: 10/29/2020 CLINICAL DATA:  Metastatic disease evaluation. New diagnosis of lung cancer. EXAM: MRI HEAD WITHOUT AND WITH CONTRAST TECHNIQUE: Multiplanar, multiecho pulse sequences of the brain and surrounding structures were obtained without and with intravenous contrast. CONTRAST:  47mL GADAVIST GADOBUTROL 1 MMOL/ML IV SOLN COMPARISON:  MRI head September 23, 2016. FINDINGS: Brain: No acute infarction, hemorrhage, hydrocephalus, extra-axial collection or mass lesion. No substantial change in scattered T2/FLAIR hyperintensities within the white matter, most likely related to chronic microvascular ischemic disease in this patient  with multiple risk factors. Vascular: Major arterial flow voids are maintained at the skull base. Skull and upper cervical spine: Normal marrow signal. Sinuses/Orbits: Mild ethmoid air cell mucosal thickening. Otherwise, visualized sinuses are clear. No air-fluid levels. Unremarkable orbits. Other: No sizable mastoid effusions. IMPRESSION: No evidence of acute intracranial abnormality or metastatic disease. Electronically Signed   By: Margaretha Sheffield MD   On: 10/29/2020 10:10   NM PET Image Initial (PI) Skull Base To Thigh  Result Date: 10/18/2020 CLINICAL DATA:  Initial treatment strategy for pulmonary mass. EXAM: NUCLEAR MEDICINE PET SKULL BASE TO THIGH TECHNIQUE: 10.9 mCi F-18 FDG was injected intravenously. Full-ring PET imaging was performed from the skull base to thigh after the radiotracer. CT data was obtained and used for attenuation correction and anatomic localization. Fasting blood glucose: 96 mg/dl COMPARISON:  CT 10/08/2020 FINDINGS: Mediastinal blood pool activity: SUV max 2.2 Liver activity: SUV max 3.1 NECK: No hypermetabolic lymph nodes in the neck. Incidental CT findings: none CHEST: IN superior segment aspect of the LEFT lower lobe rounded mass measures 4.4 x 3.6 cm and shares a broad surface with the pleural. Mass is intensely hypermetabolic with SUV max equal 10.7 (image 107) A small nodule along the oblique fissure in the same LEFT lower lobe measures 0.9 cm (image 111) with faint metabolic activity (SUV max equal 1.8). There is intense hypermetabolic LEFT hilar lymph node measuring approximately 2.0  cm with SUV max equal 9.7 on image 104. No hypermetabolic mediastinal lymph nodes. No hypermetabolic supraclavicular nodes. Within the RIGHT lung, there is a calcified nodule in the RIGHT middle lobe (image 95/3). No suspicious nodularity. Incidental CT findings: Post CABG. ABDOMEN/PELVIS: In the RIGHT suprarenal space, there is a rounded nodule measuring 11 mm (image 155/series 3). This  nodule is hypermetabolic with SUV max equal 6.11 on image 151. There is misregistration between the PET data set in the [place] set. interestingly, this nodule measured only 4 mm on CT 8 days prior (155/series 2). Incidental CT findings: Atherosclerotic calcification of the aorta. SKELETON: No focal hypermetabolic activity to suggest skeletal metastasis. Incidental CT findings: none IMPRESSION: 1. Hypermetabolic mass in the superior segment of the LEFT lower lobe consistent primary bronchogenic carcinoma. 2. Hypermetabolic nodal metastasis to the LEFT hilum. 3. Faint hypermetabolic nodule within the same LEFT lower lobe along the fissure is concerning for a metastatic lesion. 4. Enlarging nodule within the RIGHT suprarenal space concerning for a metastatic lesion. Electronically Signed   By: Suzy Bouchard M.D.   On: 10/18/2020 11:21   DG Chest Port 1 View  Result Date: 10/18/2020 CLINICAL DATA:  80 year old male 1 hour status post CT-guided left lower lobe lung mass biopsy. EXAM: PORTABLE CHEST - 1 VIEW COMPARISON:  10/02/2020, 10/08/2020, 10/17/2020, 10/18/2020 FINDINGS: The mediastinal contours are within normal limits. No cardiomegaly. Ill-defined, rounded opacity projecting just superior to the left hilum compatible with known left lower lobe mass. Faint density increased from comparison radiograph, however unchanged from postprocedural imaging after biopsy. No new focal consolidations. No pleural effusion or pneumothorax. Sternotomy wires remain in place. No acute osseous abnormality. IMPRESSION: Stable small left lower lobe pulmonary hemorrhage after CT-guided lung mass biopsy. No pneumothorax. Ruthann Cancer, MD [facility] Radiology Electronically Signed   By: Ruthann Cancer MD   On: 10/18/2020 11:45   Pulmonary Function Test [facility] Only  Result Date: 10/18/2020 Spirometry Data Is Acceptable and Reproducible No obvious evidence of Obstructive Airways  disease or Restrictive Lung disease Consider outpatient follow up with Pulmonary if needed. Clinical Correlation Advised   CT LUNG MASS BIOPSY  Result Date: 10/18/2020 INDICATION: 80 year old male with indeterminate left lower lobe mass. EXAM: CT-guided lung biopsy COMPARISON:  None. MEDICATIONS: None. ANESTHESIA/SEDATION: Fentanyl 100 mcg IV; Versed 2 mg IV Sedation time: 16 minutes; The patient was continuously monitored during the procedure by the interventional radiology nurse under my direct supervision. CONTRAST:  None COMPLICATIONS: SIR Level A - No therapy, no consequence. Self limited pulmonary hemorrhage after biopsy. PROCEDURE: Informed consent was obtained from the patient following an explanation of the procedure, risks, benefits and alternatives. The patient understands,agrees and consents for the procedure. All questions were addressed. A time out was performed prior to the initiation of the procedure. The patient was positioned in the left lateral decubitus position on the CT table and a limited chest CT was performed for procedural planning demonstrating similar appearing 4.1 cm subpleural, posterior left lower lobe mass. The operative site was prepped and draped in the usual sterile fashion. Under sterile conditions and local anesthesia, a 17 gauge coaxial needle was advanced into the peripheral aspect of the nodule. Positioning was confirmed with intermittent CT fluoroscopy and followed by the acquisition of a total of 3 samples with an 18 gauge core needle biopsy device. The coaxial needle was removed following deployment of a [employer] plug and superficial hemostasis was achieved with manual compression. Limited post procedural  chest CT was negative for pneumothorax but did demonstrate pulmonary hemorrhage measuring up to approximately 5.2 cm in maximum axial dimension, increased from 5.1 cm 4 minutes prior. A dressing was placed. The patient tolerated the procedure well without immediate  postprocedural complication. The patient was escorted to have an upright chest radiograph. IMPRESSION: Technically successful CT guided core needle core biopsy of left lower lobe lung mass. Ruthann Cancer, MD [facility] Radiology Electronically Signed   By: Ruthann Cancer MD   On: 10/18/2020 10:53    Labs:  CBC: Recent Labs    03/15/20 0804 10/12/20 1019 10/18/20 0901 11/06/20 1004  WBC 7.0 8.7 8.9 11.6*  HGB 14.7 14.4 14.6 14.3  HCT 43.4 42.2 43.6 43.4  PLT 218.0 245 253 220    COAGS: Recent Labs    10/12/20 1019 10/18/20 0901  INR 1.1 1.1  APTT 35  --     BMP: Recent Labs    03/15/20 0804 10/08/20 1147 10/12/20 1019  NA 140  --  137  K 4.5  --  4.4  CL 105  --  104  CO2 28  --  22  GLUCOSE 90  --  101*  BUN 24*  --  23  CALCIUM 9.6  --  9.1  CREATININE 1.14 1.00 0.84  GFRNONAA  --   --  >60    LIVER FUNCTION TESTS: Recent Labs    03/15/20 0804 10/12/20 1019  BILITOT 0.6 0.7  AST 11 16  ALT 14 16  ALKPHOS 51 56  PROT 6.4 7.0  ALBUMIN 4.3 4.0    TUMOR MARKERS: No results for input(s): AFPTM, CEA, CA199, CHROMGRNA in the last 8760 hours.  Assessment and Plan:  80 y/o M with recently diagnosed non-small cell lung cancer (T3N1 vs stage IV) seen today for right suprarenal nodule biopsy to help with appropriate staging.   Patient has been NPO since 8 pm last night, last dose of ASA 5/26. Afebrile, WBC 11.6, hgb 14.3, plt 220, INR pending.  Risks and benefits of right suprarenal biopsy was discussed with the patient and/or patient's family including, but not limited to bleeding, infection, damage to adjacent structures or low yield requiring additional tests.  All of the questions were answered and there is agreement to proceed.  Consent signed and in chart.  Thank you for this interesting consult.  I greatly enjoyed meeting Randall Ali. and look forward to participating in their care.  A copy of  this report was sent to the requesting provider on this date.  Electronically Signed: Joaquim Nam, PA-C 11/06/2020, 10:46 AM   I spent a total of 15 Minutes in face to face in clinical consultation, greater than 50% of which was counseling/coordinating care for right suprarenal biopsy.

## 2020-11-06 NOTE — Progress Notes (Signed)
Patient on schedule for Port placement 11/09/2020, called and spoke with patient on phone with pre procedure instructions given. Made aware to be here @ 0900, NPO after [place] prior to procedure as well as driver post procedure/recovery/discharge. Stated understanding.

## 2020-11-06 NOTE — Procedures (Signed)
Interventional Radiology Procedure Note  Procedure: CT guided biopsy of right retroperitoneal mass  Indication: Right retroperitoneal mass  Findings: Please refer to procedural dictation for full description.  Complications: None  EBL: < 10 mL  Miachel Roux, MD [phone]

## 2020-11-06 NOTE — Progress Notes (Signed)
Per Dr. Rogue Bussing, [employer] One needs to be ordered. Order placed today via online portal. Pathology made aware to send out block. Nothing further needed at this time.

## 2020-11-07 ENCOUNTER — Telehealth: Payer: Self-pay

## 2020-11-07 ENCOUNTER — Other Ambulatory Visit: Payer: Self-pay | Admitting: Family Medicine

## 2020-11-07 NOTE — Telephone Encounter (Signed)
ERx 

## 2020-11-08 ENCOUNTER — Encounter (INDEPENDENT_AMBULATORY_CARE_PROVIDER_SITE_OTHER): Payer: PPO

## 2020-11-08 ENCOUNTER — Other Ambulatory Visit: Payer: Self-pay | Admitting: Student

## 2020-11-08 ENCOUNTER — Ambulatory Visit (INDEPENDENT_AMBULATORY_CARE_PROVIDER_SITE_OTHER): Payer: PPO | Admitting: Vascular Surgery

## 2020-11-09 ENCOUNTER — Other Ambulatory Visit: Payer: Self-pay

## 2020-11-09 ENCOUNTER — Ambulatory Visit
Admission: RE | Admit: 2020-11-09 | Discharge: 2020-11-09 | Disposition: A | Discharge status: Home / Self Care | Payer: PPO | Source: Ambulatory Visit | Attending: Internal Medicine | Admitting: Internal Medicine

## 2020-11-09 DIAGNOSIS — C3432 Malignant neoplasm of lower lobe, left bronchus or lung: Secondary | ICD-10-CM | POA: Diagnosis not present

## 2020-11-09 DIAGNOSIS — C3492 Malignant neoplasm of unspecified part of left bronchus or lung: Secondary | ICD-10-CM | POA: Diagnosis not present

## 2020-11-09 DIAGNOSIS — Z888 Allergy status to other drugs, medicaments and biological substances status: Secondary | ICD-10-CM | POA: Insufficient documentation

## 2020-11-09 DIAGNOSIS — Z7982 Long term (current) use of aspirin: Secondary | ICD-10-CM | POA: Insufficient documentation

## 2020-11-09 DIAGNOSIS — Z452 Encounter for adjustment and management of vascular access device: Secondary | ICD-10-CM | POA: Diagnosis not present

## 2020-11-09 DIAGNOSIS — Z87891 Personal history of nicotine dependence: Secondary | ICD-10-CM | POA: Insufficient documentation

## 2020-11-09 DIAGNOSIS — Z79899 Other long term (current) drug therapy: Secondary | ICD-10-CM | POA: Diagnosis not present

## 2020-11-09 HISTORY — PX: IR IMAGING GUIDED PORT INSERTION: IMG5740

## 2020-11-09 LAB — SURGICAL PATHOLOGY

## 2020-11-09 MED ORDER — MIDAZOLAM HCL 2 MG/2ML IJ SOLN
INTRAMUSCULAR | Status: AC | PRN
  Administered 2020-11-09 (×2): 1 mg via INTRAVENOUS

## 2020-11-09 MED ORDER — MIDAZOLAM HCL 2 MG/2ML IJ SOLN
INTRAMUSCULAR | Status: AC
  Filled 2020-11-09: qty 2

## 2020-11-09 MED ORDER — FENTANYL CITRATE (PF) 100 MCG/2ML IJ SOLN
INTRAMUSCULAR | Status: AC | PRN
  Administered 2020-11-09 (×2): 50 ug via INTRAVENOUS

## 2020-11-09 MED ORDER — FENTANYL CITRATE (PF) 100 MCG/2ML IJ SOLN
INTRAMUSCULAR | Status: AC
  Filled 2020-11-09: qty 2

## 2020-11-09 MED ORDER — SODIUM CHLORIDE 0.9 % IV SOLN: INTRAVENOUS | Status: DC

## 2020-11-09 NOTE — Consult Note (Signed)
Chief Complaint: Patient was seen in consultation today for port placement at the request of Brahmanday,Govinda R  Referring Physician(s): Brahmanday,Govinda R  Patient Status: [facility] - Out-pt  History of Present Illness: Randall Ali. is a 80 y.o. male non-small cell lung cancer.  Port was requested for chemotherapy.  Patient has no new complaints today.  Patient asked about peripheral IV vs port placement for his treatment.  Dr. Rogue Bussing was contacted and he recommended port placement because the patient may need ongoing therapy after the initial therapy.  Patient was comfortable with a port after explanation of the device.  Past Medical History:  Diagnosis Date  . Allergy    seasonal  . Arthritis    all over- in general   . CAD (coronary artery disease)    a. inferior wall MI 10/01 s/p PCI/DES to RCA; b. Myoview 3/16 neg for ischemia; c. LHC 8/16: ostLAD 80%, OM1 70%, OM2 70% x 2 lesions, mRCA 30%, dRCA 70% s/p 4-V CABG 01/24/15 (LIMA-LAD, VG- OM1, VG-OM2, VG-PDA)   . Cancer (HCC)    skin, melanoma  . Carotid artery disease ([place])    a. [place] 8/16: 1-39% bilateral ICA stenosis  . Cataract    removed  . Diastolic dysfunction    a. TTE 8/16: EF 55-60%, no RWMA, Gr1DD, calcified mitral annulus, mild biatrial enlargement  . Erectile dysfunction   . GERD (gastroesophageal reflux disease)   . History of elbow surgery   . History of hiatal hernia   . HLD (hyperlipidemia)   . HTN (hypertension)   . Inferior myocardial infarction ([place]) 10/01   stent RCA  . Postoperative wound infection 02/02/2015  . Reflux esophagitis   . Sleep apnea 2017   CPAP at night    Past Surgical History:  Procedure Laterality Date  . arm surgery  2010  . BROW LIFT Bilateral 11/25/2019   Procedure: BROW PTOSIS REPAIR BILATERAL;  Surgeon: Karle Starch, MD;  Location: [place];  Service: Ophthalmology;  Laterality: Bilateral;  sleep apnea  . CARDIAC CATHETERIZATION  06/24/11  . CARDIAC  CATHETERIZATION N/A 01/18/2015   Procedure: Left Heart Cath with coronary angiography;  Surgeon: Minna Merritts, MD;  Location: [place] [facility];  Service: Cardiovascular;  Laterality: N/A;  . CARDIAC CATHETERIZATION N/A 01/18/2015   Procedure: Intravascular Pressure Wire/FFR Study;  Surgeon: [name], MD;  Location: [place] [facility];  Service: Cardiovascular;  Laterality: N/A;  . CAROTID STENT  03/10/2011  . COLONOSCOPY  2010  . COLONOSCOPY  06/14/2014   Dr Hilarie Fredrickson  . CORONARY ARTERY BYPASS GRAFT N/A 01/24/2015   Procedure: CORONARY ARTERY BYPASS GRAFTING x 4 (LIMA-LAD, SVG-Int 1- Int 2, SVG-PD) ENDOSCOPIC GREATER SAPHENOUS VEIN HARVEST LEFT LEG;  Surgeon: [name], MD;  Location: [place];  Service: Open Heart Surgery;  Laterality: N/A;  . EMBOLECTOMY  06/15/2019   Procedure: EMBOLECTOMY;  Surgeon: Katha Cabal, MD;  Location: [facility];  Service: Vascular;;  right superficial femoral artery  . ENDARTERECTOMY FEMORAL Right 06/15/2019   Procedure: ENDARTERECTOMY FEMORAL;  Surgeon: Katha Cabal, MD;  Location: [facility];  Service: Vascular;  Laterality: Right;  common femoral profunda femoris superficial femoral  . ESOPHAGOGASTRODUODENOSCOPY (EGD) WITH PROPOFOL N/A 04/24/2016   Procedure: ESOPHAGOGASTRODUODENOSCOPY (EGD) WITH PROPOFOL;  Surgeon: Jerene Bears, MD;  [facility];  Service: Gastroenterology;  Laterality: N/A;  . EYE SURGERY     lasik 15 yrs. ago, cataracts removed - both eyes   .  HAMMER TOE SURGERY     right toe  . INSERTION OF ILIAC STENT Right 06/15/2019   Procedure: INSERTION OF ILIAC STENT ( STENTING OF SFA/POP ARTERY );  Surgeon: Katha Cabal, MD;  Location: [facility];  Service: Vascular;  Laterality: Right;  angioplpasty and stent placement: right superficial femoral right tibiopopliteal trunk bilateral common iliac arteries  . LEFT HEART CATH AND CORONARY ANGIOGRAPHY Left 06/10/2017   Procedure: LEFT HEART CATH AND CORONARY  ANGIOGRAPHY;  Surgeon: Minna Merritts, MD;  Location: [place] [facility];  Service: Cardiovascular;  Laterality: Left;  . LOWER EXTREMITY ANGIOGRAPHY Left 01/04/2019   Procedure: LOWER EXTREMITY ANGIOGRAPHY;  Surgeon: Katha Cabal, MD;  Location: [place] [facility];  Service: Cardiovascular;  Laterality: Left;  . LOWER EXTREMITY ANGIOGRAPHY Right 01/25/2019   Procedure: LOWER EXTREMITY ANGIOGRAPHY;  Surgeon: Katha Cabal, MD;  Location: [place] [facility];  Service: Cardiovascular;  Laterality: Right;  . NASAL SINUS SURGERY  2008   septpolasty, bilateral turbinate reduction  . SHOULDER ARTHROSCOPY  2012  . TEE WITHOUT CARDIOVERSION N/A 01/24/2015   Procedure: TRANSESOPHAGEAL ECHOCARDIOGRAM (TEE);  Surgeon: [name], MD;  Location: [place];  Service: Open Heart Surgery;  Laterality: N/A;  . [place]  . UPPER GI ENDOSCOPY  07/2014, 04-24-16   Dr Raquel James  . WRIST SURGERY  2011    Allergies: Flomax [tamsulosin]  Medications: Prior to Admission medications   Medication Sig Start Date End Date Taking? Authorizing Provider  Ascorbic Acid (VITAMIN C) 1000 MG tablet Take 1,000 mg by mouth daily.   Yes [provider]  aspirin 81 MG EC tablet Take 81 mg by mouth daily.     Yes [provider]  Calcium-Magnesium-Vitamin D (CALCIUM 1200+D3 PO) Take 1 tablet by mouth daily.   Yes [provider]  Carboxymethylcellul-Glycerin (LUBRICATING EYE DROPS OP) Place 1 drop into both eyes daily as needed (irritation).   Yes [provider]  Cholecalciferol (VITAMIN D3) 50 MCG (2000 UT) TABS Take 2,000 Units by mouth daily.   Yes [provider]  Cyanocobalamin (B-12) 5000 MCG CAPS Take 5,000 mcg by mouth daily.   Yes [provider]  docusate sodium (COLACE) 100 MG capsule Take 100 mg by mouth at bedtime.   Yes [provider]  ibuprofen (ADVIL) 200 MG tablet Take 400-600 mg by mouth every 6 (six) hours as needed  for moderate pain.   Yes [provider]  lisinopril (ZESTRIL) 5 MG tablet TAKE 1 TABLET BY MOUTH DAILY Patient taking differently: Take 5 mg by mouth daily. 04/27/20  Yes Ria Bush, MD  Melatonin 10 MG CAPS Take 10 mg by mouth at bedtime as needed (sleep).   Yes [provider]  metoprolol succinate (TOPROL-XL) 25 MG 24 hr tablet TAKE 1/2 TABLET (12.5 MG) BY MOUTH DAILY Patient taking differently: Take 12.5 mg by mouth daily. 05/21/20  Yes Ria Bush, MD  Niacinamide-Zn-Cu-Methfo-Se-Cr (NICOTINAMIDE PO) Take 500 mg by mouth daily.   Yes [provider]  pantoprazole (PROTONIX) 40 MG tablet Take 1 tablet (40 mg total) by mouth daily. Patient taking differently: Take 40 mg by mouth at bedtime. 01/04/20  Yes Ria Bush, MD  Polyethyl Glycol-Propyl Glycol (LUBRICANT EYE DROPS) 0.4-0.3 % SOLN Place 1-2 drops into both eyes 3 (three) times daily as needed (burning eyes.).   Yes [provider]  simvastatin (ZOCOR) 40 MG tablet Take 1 tablet (40 mg total) by mouth at bedtime. 01/10/20  Yes  Minna Merritts, MD  traMADol (ULTRAM) 50 MG tablet TAKE 2 TABLETS BY MOUTH DAILY AS NEEDED 11/07/20  Yes Ria Bush, MD  Vitamin E 450 MG (1000 UT) CAPS Take 1,000 Units by mouth daily.   Yes [provider]  Zinc 50 MG TABS Take 50 mg by mouth daily.   Yes [provider]  lidocaine-prilocaine (EMLA) cream Apply 30 -45 mins prior to port access. 11/02/20   Cammie Sickle, MD  nitroGLYCERIN (NITROSTAT) 0.4 MG SL tablet Place 1 tablet (0.4 mg total) under the tongue every 5 (five) minutes as needed for chest pain. Patient not taking: Reported on 11/09/2020 06/16/16   Minna Merritts, MD  NONFORMULARY OR COMPOUNDED ITEM Trimix (30/1/10)-(Pap/Phent/PGE)  Test Dose 3 83ml vials  Qty #3 [place] [phone] Fax [phone] 01/24/19   Festus Aloe, MD  ondansetron [facility]) 8 MG tablet One pill every 8  hours as needed for nausea/vomitting. 11/02/20   Cammie Sickle, MD  prochlorperazine (COMPAZINE) 10 MG tablet Take 1 tablet (10 mg total) by mouth every 6 (six) hours as needed for nausea or vomiting. 11/02/20   Cammie Sickle, MD     Family History  Problem Relation Age of Onset  . Hypertension Mother   . Heart disease Mother   . Hypertension Father   . Diabetes Father   . Heart disease Brother 47  . Cancer Paternal Grandfather   . Lymphoma Sister   . Colon cancer Neg Hx   . Prostate cancer Neg Hx   . Bladder Cancer Neg Hx   . Kidney cancer Neg Hx     Social History   Socioeconomic History  . Marital status: Single    Spouse name: Not on file  . Number of children: Not on file  . Years of education: 50  . Highest education level: Not on file  Occupational History  . Occupation: retired    Comment: Agricultural consultant  Tobacco Use  . Smoking status: Former Smoker    Packs/day: 2.50    Years: 40.00    Pack years: 100.00    Types: Cigarettes    Quit date: 03/24/2000    Years since quitting: 20.6  . Smokeless tobacco: Never Used  Vaping Use  . Vaping Use: Never used  Substance and Sexual Activity  . Alcohol use: Yes    Comment: occ  . Drug use: No  . Sexual activity: Yes  Other Topics Concern  . Not on file  Social History Narrative   Singled; lives with son and dog    Occ: retired, back part time at [employer];    Activity: gym 4-5d/wk   Diet: good water, fruits/vegetables daily   Caffeine Use-yes      ------------------------------------       Car sales- retd; [employer]- retd; quit smoking 2001. Alcohol couple nights a week. Live in [place]. Daughter lives 10 mins.    Social Determinants of Health   Financial Resource Strain: Low Risk   . Difficulty of Paying Living Expenses: Not hard at all  Food Insecurity: No Food Insecurity  . Worried About Charity fundraiser in the Last Year: Never true  . Ran Out of Food in the Last Year: Never true   Transportation Needs: No Transportation Needs  . Lack of Transportation (Medical): No  . Lack of Transportation (Non-Medical): No  Physical Activity: Sufficiently Active  . Days of Exercise per Week: 7 days  . Minutes of  Exercise per Session: 60 min  Stress: No Stress Concern Present  . Feeling of Stress : Not at all  Social Connections: Not on file     Vital Signs: BP (!) 141/71   Pulse 74   Temp 97.9 F (36.6 C) (Oral)   Resp 18   Ht 5\' 8"  (1.727 m)   Wt 90.7 kg   SpO2 95%   BMI 30.41 kg/m   Physical Exam Vitals reviewed.  Cardiovascular:     Rate and Rhythm: Normal rate and regular rhythm.  Pulmonary:     Effort: Pulmonary effort is normal.     Breath sounds: Normal breath sounds.  Abdominal:     General: Abdomen is flat.     Palpations: Abdomen is soft.     Imaging: MR Brain W Wo Contrast  Result Date: 10/29/2020 CLINICAL DATA:  Metastatic disease evaluation. New diagnosis of lung cancer. EXAM: MRI HEAD WITHOUT AND WITH CONTRAST TECHNIQUE: Multiplanar, multiecho pulse sequences of the brain and surrounding structures were obtained without and with intravenous contrast. CONTRAST:  65mL GADAVIST GADOBUTROL 1 MMOL/ML IV SOLN COMPARISON:  MRI head September 23, 2016. FINDINGS: Brain: No acute infarction, hemorrhage, hydrocephalus, extra-axial collection or mass lesion. No substantial change in scattered T2/FLAIR hyperintensities within the white matter, most likely related to chronic microvascular ischemic disease in this patient with multiple risk factors. Vascular: Major arterial flow voids are maintained at the skull base. Skull and upper cervical spine: Normal marrow signal. Sinuses/Orbits: Mild ethmoid air cell mucosal thickening. Otherwise, visualized sinuses are clear. No air-fluid levels. Unremarkable orbits. Other: No sizable mastoid effusions. IMPRESSION: No evidence of acute intracranial abnormality or metastatic disease. Electronically Signed   By: Margaretha Sheffield  MD   On: 10/29/2020 10:10   NM PET Image Initial (PI) Skull Base To Thigh  Result Date: 10/18/2020 CLINICAL DATA:  Initial treatment strategy for pulmonary mass. EXAM: NUCLEAR MEDICINE PET SKULL BASE TO THIGH TECHNIQUE: 10.9 mCi F-18 FDG was injected intravenously. Full-ring PET imaging was performed from the skull base to thigh after the radiotracer. CT data was obtained and used for attenuation correction and anatomic localization. Fasting blood glucose: 96 mg/dl COMPARISON:  CT 10/08/2020 FINDINGS: Mediastinal blood pool activity: SUV max 2.2 Liver activity: SUV max 3.1 NECK: No hypermetabolic lymph nodes in the neck. Incidental CT findings: none CHEST: IN superior segment aspect of the LEFT lower lobe rounded mass measures 4.4 x 3.6 cm and shares a broad surface with the pleural. Mass is intensely hypermetabolic with SUV max equal 10.7 (image 107) A small nodule along the oblique fissure in the same LEFT lower lobe measures 0.9 cm (image 111) with faint metabolic activity (SUV max equal 1.8). There is intense hypermetabolic LEFT hilar lymph node measuring approximately 2.0 cm with SUV max equal 9.7 on image 104. No hypermetabolic mediastinal lymph nodes. No hypermetabolic supraclavicular nodes. Within the RIGHT lung, there is a calcified nodule in the RIGHT middle lobe (image 95/3). No suspicious nodularity. Incidental CT findings: Post CABG. ABDOMEN/PELVIS: In the RIGHT suprarenal space, there is a rounded nodule measuring 11 mm (image 155/series 3). This nodule is hypermetabolic with SUV max equal 6.11 on image 151. There is misregistration between the PET data set in the [place] set. interestingly, this nodule measured only 4 mm on CT 8 days prior (155/series 2). Incidental CT findings: Atherosclerotic calcification of the aorta. SKELETON: No focal hypermetabolic activity to suggest skeletal metastasis. Incidental CT findings: none IMPRESSION: 1. Hypermetabolic mass in the superior segment  of the LEFT  lower lobe consistent primary bronchogenic carcinoma. 2. Hypermetabolic nodal metastasis to the LEFT hilum. 3. Faint hypermetabolic nodule within the same LEFT lower lobe along the fissure is concerning for a metastatic lesion. 4. Enlarging nodule within the RIGHT suprarenal space concerning for a metastatic lesion. Electronically Signed   By: Suzy Bouchard M.D.   On: 10/18/2020 11:21   CT BIOPSY  Result Date: 11/06/2020 INDICATION: 80 year old gentleman with lung malignancy and enlarging right retroperitoneal soft tissue implant presents to interventional radiology for CT-guided biopsy. EXAM: CT-guided biopsy of right retroperitoneal implant MEDICATIONS: None. ANESTHESIA/SEDATION: Moderate (conscious) sedation was employed during this procedure. A total of Versed 1.5 mg and Fentanyl 75 mcg was administered intravenously. Moderate Sedation Time: 22 minutes. [name] level of consciousness and vital signs were monitored continuously by radiology nursing throughout the procedure under my direct supervision. COMPLICATIONS: None immediate. PROCEDURE: Informed written consent was obtained from the patient after a thorough discussion of the procedural risks, benefits and alternatives. All questions were addressed. Maximal Sterile Barrier Technique was utilized including caps, mask, sterile gowns, sterile gloves, sterile drape, hand hygiene and skin antiseptic. A timeout was performed prior to the initiation of the procedure. Patient positioned supine on the CT table. The right lateral abdominal wall skin prepped and draped in usual fashion. Following local lidocaine administration, 17 gauge introducer needle was advanced into the right retroperitoneal mass by transhepatic approach utilizing CT guidance. 4-18 gauge cores were obtained from the right retroperitoneal mass. All samples were sent to pathology in formalin. Postprocedure CT demonstrated no significant hemorrhage or pneumothorax. IMPRESSION: CT-guided  biopsy of right retroperitoneal mass. Electronically Signed   By: Miachel Roux M.D.   On: 11/06/2020 13:48   DG Chest Port 1 View  Result Date: 10/18/2020 CLINICAL DATA:  80 year old male 1 hour status post CT-guided left lower lobe lung mass biopsy. EXAM: PORTABLE CHEST - 1 VIEW COMPARISON:  10/02/2020, 10/08/2020, 10/17/2020, 10/18/2020 FINDINGS: The mediastinal contours are within normal limits. No cardiomegaly. Ill-defined, rounded opacity projecting just superior to the left hilum compatible with known left lower lobe mass. Faint density increased from comparison radiograph, however unchanged from postprocedural imaging after biopsy. No new focal consolidations. No pleural effusion or pneumothorax. Sternotomy wires remain in place. No acute osseous abnormality. IMPRESSION: Stable small left lower lobe pulmonary hemorrhage after CT-guided lung mass biopsy. No pneumothorax. Ruthann Cancer, MD [facility] Radiology Electronically Signed   By: Ruthann Cancer MD   On: 10/18/2020 11:45   Pulmonary Function Test [facility] Only  Result Date: 10/18/2020 Spirometry Data Is Acceptable and Reproducible No obvious evidence of Obstructive Airways disease or Restrictive Lung disease Consider outpatient follow up with Pulmonary if needed. Clinical Correlation Advised   CT LUNG MASS BIOPSY  Result Date: 10/18/2020 INDICATION: 80 year old male with indeterminate left lower lobe mass. EXAM: CT-guided lung biopsy COMPARISON:  None. MEDICATIONS: None. ANESTHESIA/SEDATION: Fentanyl 100 mcg IV; Versed 2 mg IV Sedation time: 16 minutes; The patient was continuously monitored during the procedure by the interventional radiology nurse under my direct supervision. CONTRAST:  None COMPLICATIONS: SIR Level A - No therapy, no consequence. Self limited pulmonary hemorrhage after biopsy. PROCEDURE: Informed consent was obtained from the patient following an explanation of the procedure,  risks, benefits and alternatives. The patient understands,agrees and consents for the procedure. All questions were addressed. A time out was performed prior to the initiation of the procedure. The patient was positioned in the left lateral decubitus position on  the CT table and a limited chest CT was performed for procedural planning demonstrating similar appearing 4.1 cm subpleural, posterior left lower lobe mass. The operative site was prepped and draped in the usual sterile fashion. Under sterile conditions and local anesthesia, a 17 gauge coaxial needle was advanced into the peripheral aspect of the nodule. Positioning was confirmed with intermittent CT fluoroscopy and followed by the acquisition of a total of 3 samples with an 18 gauge core needle biopsy device. The coaxial needle was removed following deployment of a [employer] plug and superficial hemostasis was achieved with manual compression. Limited post procedural chest CT was negative for pneumothorax but did demonstrate pulmonary hemorrhage measuring up to approximately 5.2 cm in maximum axial dimension, increased from 5.1 cm 4 minutes prior. A dressing was placed. The patient tolerated the procedure well without immediate postprocedural complication. The patient was escorted to have an upright chest radiograph. IMPRESSION: Technically successful CT guided core needle core biopsy of left lower lobe lung mass. Ruthann Cancer, MD [facility] Radiology Electronically Signed   By: Ruthann Cancer MD   On: 10/18/2020 10:53    Labs:  CBC: Recent Labs    03/15/20 0804 10/12/20 1019 10/18/20 0901 11/06/20 1004  WBC 7.0 8.7 8.9 11.6*  HGB 14.7 14.4 14.6 14.3  HCT 43.4 42.2 43.6 43.4  PLT 218.0 245 253 220    COAGS: Recent Labs    10/12/20 1019 10/18/20 0901 11/06/20 1004  INR 1.1 1.1 1.1  APTT 35  --   --     BMP: Recent Labs    03/15/20 0804 10/08/20 1147 10/12/20 1019  NA 140  --   137  K 4.5  --  4.4  CL 105  --  104  CO2 28  --  22  GLUCOSE 90  --  101*  BUN 24*  --  23  CALCIUM 9.6  --  9.1  CREATININE 1.14 1.00 0.84  GFRNONAA  --   --  >60    LIVER FUNCTION TESTS: Recent Labs    03/15/20 0804 10/12/20 1019  BILITOT 0.6 0.7  AST 11 16  ALT 14 16  ALKPHOS 51 56  PROT 6.4 7.0  ALBUMIN 4.3 4.0    TUMOR MARKERS: No results for input(s): AFPTM, CEA, CA199, CHROMGRNA in the last 8760 hours.  Assessment and Plan:  80 yo with non-small cell lung cancer and needs a port for chemotherapy.   Risks and benefits of image guided port-a-catheter placement was discussed with the patient including, but not limited to bleeding, infection, pneumothorax, or fibrin sheath development and need for additional procedures.  All of the patient's questions were answered, patient is agreeable to proceed. Consent signed and in chart.    Electronically Signed: Burman Riis, MD 11/09/2020, 10:35 AM   I spent a total of  10 minutes   in face to face in clinical consultation, greater than 50% of which was counseling/coordinating care for port placement.

## 2020-11-09 NOTE — Procedures (Signed)
Interventional Radiology Procedure:   Indications: Lung cancer  Procedure: Port placement  Findings: Right jugular port, tip at SVC/RA junction  Complications: None     EBL: Minimal, less than 10 ml  Plan: Discharge in one hour.  Keep port site and incisions dry for at least 24 hours.     Brandi Armato R. Anselm Pancoast, MD  Pager: [phone]

## 2020-11-12 NOTE — Patient Instructions (Signed)
Pembrolizumab injection What is this medicine? PEMBROLIZUMAB (pem broe liz ue mab) is a monoclonal antibody. It is used to treat certain types of cancer. This medicine may be used for other purposes; ask your health care provider or pharmacist if you have questions. COMMON BRAND NAME(S): Keytruda What should I tell my health care provider before I take this medicine? They need to know if you have any of these conditions:  autoimmune diseases like Crohn's disease, ulcerative colitis, or lupus  have had or planning to have an allogeneic stem cell transplant (uses someone else's stem cells)  history of organ transplant  history of chest radiation  nervous system problems like myasthenia gravis or Guillain-Barre syndrome  an unusual or allergic reaction to pembrolizumab, other medicines, foods, dyes, or preservatives  pregnant or trying to get pregnant  breast-feeding How should I use this medicine? This medicine is for infusion into a vein. It is given by a health care professional in a hospital or clinic setting. A special MedGuide will be given to you before each treatment. Be sure to read this information carefully each time. Talk to your pediatrician regarding the use of this medicine in children. While this drug may be prescribed for children as young as 6 months for selected conditions, precautions do apply. Overdosage: If you think you have taken too much of this medicine contact a poison control center or emergency room at once. NOTE: This medicine is only for you. Do not share this medicine with others. What if I miss a dose? It is important not to miss your dose. Call your doctor or health care professional if you are unable to keep an appointment. What may interact with this medicine? Interactions have not been studied. This list may not describe all possible interactions. Give your health care provider a list of all the medicines, herbs, non-prescription drugs, or dietary  supplements you use. Also tell them if you smoke, drink alcohol, or use illegal drugs. Some items may interact with your medicine. What should I watch for while using this medicine? Your condition will be monitored carefully while you are receiving this medicine. You may need blood work done while you are taking this medicine. Do not become pregnant while taking this medicine or for 4 months after stopping it. Women should inform their doctor if they wish to become pregnant or think they might be pregnant. There is a potential for serious side effects to an unborn child. Talk to your health care professional or pharmacist for more information. Do not breast-feed an infant while taking this medicine or for 4 months after the last dose. What side effects may I notice from receiving this medicine? Side effects that you should report to your doctor or health care professional as soon as possible:  allergic reactions like skin rash, itching or hives, swelling of the face, lips, or tongue  bloody or black, tarry  breathing problems  changes in vision  chest pain  chills  confusion  constipation  cough  diarrhea  dizziness or feeling faint or lightheaded  fast or irregular heartbeat  fever  flushing  joint pain  low blood counts - this medicine may decrease the number of white blood cells, red blood cells and platelets. You may be at increased risk for infections and bleeding.  muscle pain  muscle weakness  pain, tingling, numbness in the hands or feet  persistent headache  redness, blistering, peeling or loosening of the skin, including inside the mouth  signs and  symptoms of high blood sugar such as dizziness; dry mouth; dry skin; fruity breath; nausea; stomach pain; increased hunger or thirst; increased urination  signs and symptoms of kidney injury like trouble passing urine or change in the amount of urine  signs and symptoms of liver injury like dark urine,  light-colored stools, loss of appetite, nausea, right upper belly pain, yellowing of the eyes or skin  sweating  swollen lymph nodes  weight loss Side effects that usually do not require medical attention (report to your doctor or health care professional if they continue or are bothersome):  decreased appetite  hair loss  tiredness This list may not describe all possible side effects. Call your doctor for medical advice about side effects. You may report side effects to FDA at 1-800-FDA-1088. Where should I keep my medicine? This drug is given in a hospital or clinic and will not be stored at home. NOTE: This sheet is a summary. It may not cover all possible information. If you have questions about this medicine, talk to your doctor, pharmacist, or health care provider.  2021 [employer]/Gold Standard (2019-04-27 21:44:53) Paclitaxel injection What is this medicine? PACLITAXEL (PAK li TAX el) is a chemotherapy drug. It targets fast dividing cells, like cancer cells, and causes these cells to die. This medicine is used to treat ovarian cancer, breast cancer, lung cancer, Kaposi's sarcoma, and other cancers. This medicine may be used for other purposes; ask your health care provider or pharmacist if you have questions. COMMON BRAND NAME(S): Onxol, Taxol What should I tell my health care provider before I take this medicine? They need to know if you have any of these conditions:  history of irregular heartbeat  liver disease  low blood counts, like low white cell, platelet, or red cell counts  lung or breathing disease, like asthma  tingling of the fingers or toes, or other nerve disorder  an unusual or allergic reaction to paclitaxel, alcohol, polyoxyethylated castor oil, other chemotherapy, other medicines, foods, dyes, or preservatives  pregnant or trying to get pregnant  breast-feeding How should I use this medicine? This drug is given as an infusion into a vein. It is  administered in a hospital or clinic by a specially trained health care professional. Talk to your pediatrician regarding the use of this medicine in children. Special care may be needed. Overdosage: If you think you have taken too much of this medicine contact a poison control center or emergency room at once. NOTE: This medicine is only for you. Do not share this medicine with others. What if I miss a dose? It is important not to miss your dose. Call your doctor or health care professional if you are unable to keep an appointment. What may interact with this medicine? Do not take this medicine with any of the following medications:  live virus vaccines This medicine may also interact with the following medications:  antiviral medicines for hepatitis, HIV or AIDS  certain antibiotics like erythromycin and clarithromycin  certain medicines for fungal infections like ketoconazole and itraconazole  certain medicines for seizures like carbamazepine, phenobarbital, phenytoin  gemfibrozil  nefazodone  rifampin  St. John's wort This list may not describe all possible interactions. Give your health care provider a list of all the medicines, herbs, non-prescription drugs, or dietary supplements you use. Also tell them if you smoke, drink alcohol, or use illegal drugs. Some items may interact with your medicine. What should I watch for while using this medicine? Your condition will  be monitored carefully while you are receiving this medicine. You will need important blood work done while you are taking this medicine. This medicine can cause serious allergic reactions. To reduce your risk you will need to take other medicine(s) before treatment with this medicine. If you experience allergic reactions like skin rash, itching or hives, swelling of the face, lips, or tongue, tell your doctor or health care professional right away. In some cases, you may be given additional medicines to help with  side effects. Follow all directions for their use. This drug may make you feel generally unwell. This is not uncommon, as chemotherapy can affect healthy cells as well as cancer cells. Report any side effects. Continue your course of treatment even though you feel ill unless your doctor tells you to stop. Call your doctor or health care professional for advice if you get a fever, chills or sore throat, or other symptoms of a cold or flu. Do not treat yourself. This drug decreases your body's ability to fight infections. Try to avoid being around people who are sick. This medicine may increase your risk to bruise or bleed. Call your doctor or health care professional if you notice any unusual bleeding. Be careful brushing and flossing your teeth or using a toothpick because you may get an infection or bleed more easily. If you have any dental work done, tell your dentist you are receiving this medicine. Avoid taking products that contain aspirin, acetaminophen, ibuprofen, naproxen, or ketoprofen unless instructed by your doctor. These medicines may hide a fever. Do not become pregnant while taking this medicine. Women should inform their doctor if they wish to become pregnant or think they might be pregnant. There is a potential for serious side effects to an unborn child. Talk to your health care professional or pharmacist for more information. Do not breast-feed an infant while taking this medicine. Men are advised not to father a child while receiving this medicine. This product may contain alcohol. Ask your pharmacist or healthcare provider if this medicine contains alcohol. Be sure to tell all healthcare providers you are taking this medicine. Certain medicines, like metronidazole and disulfiram, can cause an unpleasant reaction when taken with alcohol. The reaction includes flushing, headache, nausea, vomiting, sweating, and increased thirst. The reaction can last from 30 minutes to several hours. What  side effects may I notice from receiving this medicine? Side effects that you should report to your doctor or health care professional as soon as possible:  allergic reactions like skin rash, itching or hives, swelling of the face, lips, or tongue  breathing problems  changes in vision  fast, irregular heartbeat  high or low blood pressure  mouth sores  pain, tingling, numbness in the hands or feet  signs of decreased platelets or bleeding - bruising, pinpoint red spots on the skin, black, tarry stools, blood in the urine  signs of decreased red blood cells - unusually weak or tired, feeling faint or lightheaded, falls  signs of infection - fever or chills, cough, sore throat, pain or difficulty passing urine  signs and symptoms of liver injury like dark yellow or brown urine; general ill feeling or flu-like symptoms; light-colored stools; loss of appetite; nausea; right upper belly pain; unusually weak or tired; yellowing of the eyes or skin  swelling of the ankles, feet, hands  unusually slow heartbeat Side effects that usually do not require medical attention (report to your doctor or health care professional if they continue or are bothersome):  diarrhea  hair loss  loss of appetite  muscle or joint pain  nausea, vomiting  pain, redness, or irritation at site where injected  tiredness This list may not describe all possible side effects. Call your doctor for medical advice about side effects. You may report side effects to FDA at 1-800-FDA-1088. Where should I keep my medicine? This drug is given in a hospital or clinic and will not be stored at home. NOTE: This sheet is a summary. It may not cover all possible information. If you have questions about this medicine, talk to your doctor, pharmacist, or health care provider.  2021 [employer]/Gold Standard (2019-04-27 13:37:23) Carboplatin injection What is this medicine? CARBOPLATIN (KAR boe pla tin) is a  chemotherapy drug. It targets fast dividing cells, like cancer cells, and causes these cells to die. This medicine is used to treat ovarian cancer and many other cancers. This medicine may be used for other purposes; ask your health care provider or pharmacist if you have questions. COMMON BRAND NAME(S): Paraplatin What should I tell my health care provider before I take this medicine? They need to know if you have any of these conditions:  blood disorders  hearing problems  kidney disease  recent or ongoing radiation therapy  an unusual or allergic reaction to carboplatin, cisplatin, other chemotherapy, other medicines, foods, dyes, or preservatives  pregnant or trying to get pregnant  breast-feeding How should I use this medicine? This drug is usually given as an infusion into a vein. It is administered in a hospital or clinic by a specially trained health care professional. Talk to your pediatrician regarding the use of this medicine in children. Special care may be needed. Overdosage: If you think you have taken too much of this medicine contact a poison control center or emergency room at once. NOTE: This medicine is only for you. Do not share this medicine with others. What if I miss a dose? It is important not to miss a dose. Call your doctor or health care professional if you are unable to keep an appointment. What may interact with this medicine?  medicines for seizures  medicines to increase blood counts like filgrastim, pegfilgrastim, sargramostim  some antibiotics like amikacin, gentamicin, neomycin, streptomycin, tobramycin  vaccines Talk to your doctor or health care professional before taking any of these medicines:  acetaminophen  aspirin  ibuprofen  ketoprofen  naproxen This list may not describe all possible interactions. Give your health care provider a list of all the medicines, herbs, non-prescription drugs, or dietary supplements you use. Also tell  them if you smoke, drink alcohol, or use illegal drugs. Some items may interact with your medicine. What should I watch for while using this medicine? Your condition will be monitored carefully while you are receiving this medicine. You will need important blood work done while you are taking this medicine. This drug may make you feel generally unwell. This is not uncommon, as chemotherapy can affect healthy cells as well as cancer cells. Report any side effects. Continue your course of treatment even though you feel ill unless your doctor tells you to stop. In some cases, you may be given additional medicines to help with side effects. Follow all directions for their use. Call your doctor or health care professional for advice if you get a fever, chills or sore throat, or other symptoms of a cold or flu. Do not treat yourself. This drug decreases your body's ability to fight infections. Try to avoid being around people  who are sick. This medicine may increase your risk to bruise or bleed. Call your doctor or health care professional if you notice any unusual bleeding. Be careful brushing and flossing your teeth or using a toothpick because you may get an infection or bleed more easily. If you have any dental work done, tell your dentist you are receiving this medicine. Avoid taking products that contain aspirin, acetaminophen, ibuprofen, naproxen, or ketoprofen unless instructed by your doctor. These medicines may hide a fever. Do not become pregnant while taking this medicine. Women should inform their doctor if they wish to become pregnant or think they might be pregnant. There is a potential for serious side effects to an unborn child. Talk to your health care professional or pharmacist for more information. Do not breast-feed an infant while taking this medicine. What side effects may I notice from receiving this medicine? Side effects that you should report to your doctor or health care professional  as soon as possible:  allergic reactions like skin rash, itching or hives, swelling of the face, lips, or tongue  signs of infection - fever or chills, cough, sore throat, pain or difficulty passing urine  signs of decreased platelets or bleeding - bruising, pinpoint red spots on the skin, black, tarry stools, nosebleeds  signs of decreased red blood cells - unusually weak or tired, fainting spells, lightheadedness  breathing problems  changes in hearing  changes in vision  chest pain  high blood pressure  low blood counts - This drug may decrease the number of white blood cells, red blood cells and platelets. You may be at increased risk for infections and bleeding.  nausea and vomiting  pain, swelling, redness or irritation at the injection site  pain, tingling, numbness in the hands or feet  problems with balance, talking, walking  trouble passing urine or change in the amount of urine Side effects that usually do not require medical attention (report to your doctor or health care professional if they continue or are bothersome):  hair loss  loss of appetite  metallic taste in the mouth or changes in taste This list may not describe all possible side effects. Call your doctor for medical advice about side effects. You may report side effects to FDA at 1-800-FDA-1088. Where should I keep my medicine? This drug is given in a hospital or clinic and will not be stored at home. NOTE: This sheet is a summary. It may not cover all possible information. If you have questions about this medicine, talk to your doctor, pharmacist, or health care provider.  2021 Elsevier/Gold Standard (2007-08-31 14:38:05)

## 2020-11-13 ENCOUNTER — Encounter: Payer: Self-pay | Admitting: Internal Medicine

## 2020-11-13 ENCOUNTER — Inpatient Hospital Stay: Payer: PPO | Attending: Internal Medicine

## 2020-11-13 DIAGNOSIS — Z5111 Encounter for antineoplastic chemotherapy: Secondary | ICD-10-CM | POA: Insufficient documentation

## 2020-11-13 DIAGNOSIS — Z87891 Personal history of nicotine dependence: Secondary | ICD-10-CM | POA: Insufficient documentation

## 2020-11-13 DIAGNOSIS — Z79899 Other long term (current) drug therapy: Secondary | ICD-10-CM | POA: Insufficient documentation

## 2020-11-13 DIAGNOSIS — C3432 Malignant neoplasm of lower lobe, left bronchus or lung: Secondary | ICD-10-CM | POA: Insufficient documentation

## 2020-11-13 DIAGNOSIS — Z5112 Encounter for antineoplastic immunotherapy: Secondary | ICD-10-CM | POA: Insufficient documentation

## 2020-11-13 DIAGNOSIS — Z5189 Encounter for other specified aftercare: Secondary | ICD-10-CM | POA: Insufficient documentation

## 2020-11-13 DIAGNOSIS — I10 Essential (primary) hypertension: Secondary | ICD-10-CM | POA: Insufficient documentation

## 2020-11-13 DIAGNOSIS — Z7982 Long term (current) use of aspirin: Secondary | ICD-10-CM | POA: Insufficient documentation

## 2020-11-15 ENCOUNTER — Inpatient Hospital Stay (HOSPITAL_BASED_OUTPATIENT_CLINIC_OR_DEPARTMENT_OTHER): Payer: PPO | Admitting: Internal Medicine

## 2020-11-15 ENCOUNTER — Inpatient Hospital Stay: Payer: PPO

## 2020-11-15 VITALS — BP 147/71 | HR 63 | Temp 96.6°F | Wt 201.6 lb

## 2020-11-15 DIAGNOSIS — C3432 Malignant neoplasm of lower lobe, left bronchus or lung: Secondary | ICD-10-CM

## 2020-11-15 DIAGNOSIS — C649 Malignant neoplasm of unspecified kidney, except renal pelvis: Secondary | ICD-10-CM | POA: Diagnosis not present

## 2020-11-15 DIAGNOSIS — Z5112 Encounter for antineoplastic immunotherapy: Secondary | ICD-10-CM | POA: Diagnosis not present

## 2020-11-15 DIAGNOSIS — Z5111 Encounter for antineoplastic chemotherapy: Secondary | ICD-10-CM | POA: Diagnosis not present

## 2020-11-15 DIAGNOSIS — Z5189 Encounter for other specified aftercare: Secondary | ICD-10-CM | POA: Diagnosis not present

## 2020-11-15 DIAGNOSIS — Z87891 Personal history of nicotine dependence: Secondary | ICD-10-CM | POA: Diagnosis not present

## 2020-11-15 DIAGNOSIS — I10 Essential (primary) hypertension: Secondary | ICD-10-CM | POA: Diagnosis not present

## 2020-11-15 DIAGNOSIS — Z79899 Other long term (current) drug therapy: Secondary | ICD-10-CM | POA: Diagnosis not present

## 2020-11-15 DIAGNOSIS — Z7982 Long term (current) use of aspirin: Secondary | ICD-10-CM | POA: Diagnosis not present

## 2020-11-15 LAB — COMPREHENSIVE METABOLIC PANEL
ALT: 13 U/L (ref 0–44)
AST: 12 U/L — ABNORMAL LOW (ref 15–41)
Albumin: 3.9 g/dL (ref 3.5–5.0)
Alkaline Phosphatase: 60 U/L (ref 38–126)
Anion gap: 10 (ref 5–15)
BUN: 24 mg/dL — ABNORMAL HIGH (ref 8–23)
CO2: 23 mmol/L (ref 22–32)
Calcium: 8.9 mg/dL (ref 8.9–10.3)
Chloride: 102 mmol/L (ref 98–111)
Creatinine, Ser: 0.9 mg/dL (ref 0.61–1.24)
GFR, Estimated: >60 mL/min (ref >60)
Glucose, Bld: 100 mg/dL — ABNORMAL HIGH (ref 70–99)
Potassium: 4.3 mmol/L (ref 3.5–5.1)
Sodium: 135 mmol/L (ref 135–145)
Total Bilirubin: 0.3 mg/dL (ref 0.3–1.2)
Total Protein: 7.1 g/dL (ref 6.5–8.1)

## 2020-11-15 LAB — CBC WITH DIFFERENTIAL/PLATELET
Abs Immature Granulocytes: 0.11 10*3/uL — ABNORMAL HIGH (ref 0.00–0.07)
Basophils Absolute: 0.1 10*3/uL (ref 0.0–0.1)
Basophils Relative: 1 %
Eosinophils Absolute: 0.4 10*3/uL (ref 0.0–0.5)
Eosinophils Relative: 4 %
HCT: 41.4 % (ref 39.0–52.0)
Hemoglobin: 14.1 g/dL (ref 13.0–17.0)
Immature Granulocytes: 1 %
Lymphocytes Relative: 15 %
Lymphs Abs: 1.7 10*3/uL (ref 0.7–4.0)
MCH: 30.5 pg (ref 26.0–34.0)
MCHC: 34.1 g/dL (ref 30.0–36.0)
MCV: 89.6 fL (ref 80.0–100.0)
Monocytes Absolute: 1.4 10*3/uL — ABNORMAL HIGH (ref 0.1–1.0)
Monocytes Relative: 12 %
Neutro Abs: 7.6 10*3/uL (ref 1.7–7.7)
Neutrophils Relative %: 67 %
Platelets: 244 10*3/uL (ref 150–400)
RBC: 4.62 MIL/uL (ref 4.22–5.81)
RDW: 12.3 % (ref 11.5–15.5)
WBC: 11.2 10*3/uL — ABNORMAL HIGH (ref 4.0–10.5)
nRBC: 0 % (ref 0.0–0.2)

## 2020-11-15 MED ORDER — SODIUM CHLORIDE 0.9% FLUSH
10.0000 mL | Freq: Once | INTRAVENOUS | Status: AC
  Administered 2020-11-15: 10 mL via INTRAVENOUS
  Filled 2020-11-15: qty 10

## 2020-11-15 MED ORDER — HEPARIN SOD (PORK) LOCK FLUSH 100 UNIT/ML IV SOLN
500.0000 [IU] | Freq: Once | INTRAVENOUS | Status: AC
  Administered 2020-11-15: 500 [IU] via INTRAVENOUS
  Filled 2020-11-15: qty 5

## 2020-11-15 MED ORDER — HEPARIN SOD (PORK) LOCK FLUSH 100 UNIT/ML IV SOLN
INTRAVENOUS | Status: AC
  Filled 2020-11-15: qty 5

## 2020-11-15 NOTE — Assessment & Plan Note (Addendum)
#  Lung cancer-non-small cell-T3N1 vs stage IV [right suprarenal nodule-s/p biopsy on 5/31-see below].  MRI brain negative for malignancy. NGS- P  #Proceed with chemoimmunotherapy-CarboTaxol Keytruda.  Again reviewed the potential side effects including but not limited to nausea vomiting fatigue/immune mediated side effects. Labs today reviewed;  acceptable for treatment today.   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  Await abdominal MRI.  # CAD-s/p CABG/ PVD- .  No CHF.  STABLE.   # DISPOSITION: # MRI abdomen ASAP # as planned chemo tomorrow- [carbo-taxol-keytruda]; 6/13 fuphila # labs in 10 days- cbc/bmp # follow up in 3 weeks- MD; labs- cbc/cmp;  [carbo-taxol-keytruda]; d-2; ffuphila Dr.B

## 2020-11-15 NOTE — Progress Notes (Signed)
[facility] NOTE  Patient Care Team: Ria Bush, MD as PCP - General (Family Medicine) [name] [name], MD as PCP - Cardiology (Cardiology) [name], MD (Internal Medicine) Minna Merritts, MD as Consulting Physician (Cardiology) Pieter Partridge, DO as Consulting Physician (Neurology) Cammie Sickle, MD as Consulting Physician (Hematology and Oncology) Telford Nab, RN as Oncology Nurse Navigator  CHIEF COMPLAINTS/PURPOSE OF CONSULTATION: lung cancer    Oncology History Overview Note  #MAY 2022-Lung cancer-non-small cell [CT guided bx] T3N1 vs stage IV Dr.Hendrickson.  MRI brain negative for malignancy.# 1. April 2022- LLL ~4.0 cm mass in the superior segment left lower lobe abuts the major fissure and the posterior pleural surface without visible chest wall invasion without pleural effusion. There 2-3 other small nodules in the left lower lobe, largest measures 9 mm in diameter. These are suspicious for same lobe satellite lesions and there is left hilar adenopathy. Assuming non-small cell lung cancer the appearance is compatible with T3 N1 M0 disease (stage IIIA).  # right suprarenal nodule-awaiting biopsy on 5/31- ? RCC   # June 9th 2022- CARBO-TAXOl-KEYTRUDA; Fulphila    # CAD [CABG 2016; Dr.Gollan]   Cancer of lower lobe of left lung ([place])  11/02/2020 Initial Diagnosis   Cancer of lower lobe of left lung ([place])   11/02/2020 Cancer Staging   Staging form: Lung, AJCC 8th Edition - Clinical: Stage IVA (cT3, cN1, cM1a) - Signed by Cammie Sickle, MD on 11/02/2020    11/16/2020 -  Chemotherapy    Patient is on Treatment Plan: LUNG NSCLC CARBOPLATIN + PACLITAXEL + PEMBROLIZUMAB Q21D X 4 CYCLES / PEMBROLIZUMAB MAINTENANCE Q21D          HISTORY OF PRESENTING ILLNESS:  Randall Ali. 80 y.o.  male lung cancer-non-small cell stage III versus stage IV [supra-renal nodule s/p biopsy] is here to review the pathology  results/proceed with chemotherapy  Patient denies any worsening shortness of breath or cough.  Denies any chest pain.  Denies any headaches.  No nausea no vomiting.  He continues to be physically active.   Review of Systems  Constitutional:  Positive for malaise/fatigue. Negative for chills, diaphoresis, fever and weight loss.  HENT:  Negative for nosebleeds and sore throat.   Eyes:  Negative for double vision.  Respiratory:  Negative for cough, hemoptysis, sputum production, shortness of breath and wheezing.   Cardiovascular:  Negative for chest pain, palpitations, orthopnea and leg swelling.  Gastrointestinal:  Negative for abdominal pain, blood in stool, constipation, diarrhea, heartburn, melena, nausea and vomiting.  Genitourinary:  Negative for dysuria, frequency and urgency.  Musculoskeletal:  Positive for back pain and joint pain.  Skin: Negative.  Negative for itching and rash.  Neurological:  Negative for dizziness, tingling, focal weakness, weakness and headaches.  Endo/Heme/Allergies:  Does not bruise/bleed easily.  Psychiatric/Behavioral:  Negative for depression. The patient is not nervous/anxious and does not have insomnia.     MEDICAL HISTORY:  Past Medical History:  Diagnosis Date   Allergy    seasonal   Arthritis    all over- in general    CAD (coronary artery disease)    a. inferior wall MI 10/01 s/p PCI/DES to RCA; b. Myoview 3/16 neg for ischemia; c. LHC 8/16: ostLAD 80%, OM1 70%, OM2 70% x 2 lesions, mRCA 30%, dRCA 70% s/p 4-V CABG 01/24/15 (LIMA-LAD, VG- OM1, VG-OM2, VG-PDA)    Cancer (HCC)    skin, melanoma   Carotid artery disease ([place])  a. [place] 8/16: 1-39% bilateral ICA stenosis   Cataract    removed   Diastolic dysfunction    a. TTE 8/16: EF 55-60%, no RWMA, Gr1DD, calcified mitral annulus, mild biatrial enlargement   Erectile dysfunction    GERD (gastroesophageal reflux disease)    History of elbow surgery    History of hiatal hernia    HLD  (hyperlipidemia)    HTN (hypertension)    Inferior myocardial infarction ([place]) 10/01   stent RCA   Postoperative wound infection 02/02/2015   Reflux esophagitis    Sleep apnea 2017   CPAP at night    SURGICAL HISTORY: Past Surgical History:  Procedure Laterality Date   arm surgery  2010   BROW LIFT Bilateral 11/25/2019   Procedure: BROW PTOSIS REPAIR BILATERAL;  Surgeon: Karle Starch, MD;  Location: [facility];  Service: Ophthalmology;  Laterality: Bilateral;  sleep apnea   CARDIAC CATHETERIZATION  06/24/11   CARDIAC CATHETERIZATION N/A 01/18/2015   Procedure: Left Heart Cath with coronary angiography;  Surgeon: Minna Merritts, MD;  Location: [facility];  Service: Cardiovascular;  Laterality: N/A;   CARDIAC CATHETERIZATION N/A 01/18/2015   Procedure: Intravascular Pressure Wire/FFR Study;  Surgeon: [name], MD;  Location: [facility];  Service: Cardiovascular;  Laterality: N/A;   CAROTID STENT  03/10/2011   COLONOSCOPY  2010   COLONOSCOPY  06/14/2014   Dr Hilarie Fredrickson   CORONARY ARTERY BYPASS GRAFT N/A 01/24/2015   Procedure: CORONARY ARTERY BYPASS GRAFTING x 4 (LIMA-LAD, SVG-Int 1- Int 2, SVG-PD) ENDOSCOPIC GREATER SAPHENOUS VEIN HARVEST LEFT LEG;  Surgeon: [name], MD;  Location: [facility];  Service: Open Heart Surgery;  Laterality: N/A;   EMBOLECTOMY  06/15/2019   Procedure: EMBOLECTOMY;  Surgeon: Katha Cabal, MD;  Location: [facility];  Service: Vascular;;  right superficial femoral artery   ENDARTERECTOMY FEMORAL Right 06/15/2019   Procedure: ENDARTERECTOMY FEMORAL;  Surgeon: Katha Cabal, MD;  Location: [facility];  Service: Vascular;  Laterality: Right;  common femoral profunda femoris superficial femoral   ESOPHAGOGASTRODUODENOSCOPY (EGD) WITH PROPOFOL N/A 04/24/2016   Procedure: ESOPHAGOGASTRODUODENOSCOPY (EGD) WITH PROPOFOL;  Surgeon: Jerene Bears, MD;  Location: [facility];  Service: Gastroenterology;  Laterality: N/A;   EYE  SURGERY     lasik 15 yrs. ago, cataracts removed - both eyes    HAMMER TOE SURGERY     right toe   INSERTION OF ILIAC STENT Right 06/15/2019   Procedure: INSERTION OF ILIAC STENT ( STENTING OF SFA/POP ARTERY );  Surgeon: Katha Cabal, MD;  Location: [facility];  Service: Vascular;  Laterality: Right;  angioplpasty and stent placement: right superficial femoral right tibiopopliteal trunk bilateral common iliac arteries   IR IMAGING GUIDED PORT INSERTION  11/09/2020   LEFT HEART CATH AND CORONARY ANGIOGRAPHY Left 06/10/2017   Procedure: LEFT HEART CATH AND CORONARY ANGIOGRAPHY;  Surgeon: Minna Merritts, MD;  Location: [facility];  Service: Cardiovascular;  Laterality: Left;   LOWER EXTREMITY ANGIOGRAPHY Left 01/04/2019   Procedure: LOWER EXTREMITY ANGIOGRAPHY;  Surgeon: Katha Cabal, MD;  Location: [facility];  Service: Cardiovascular;  Laterality: Left;   LOWER EXTREMITY ANGIOGRAPHY Right 01/25/2019   Procedure: LOWER EXTREMITY ANGIOGRAPHY;  Surgeon: Katha Cabal, MD;  Location: [facility];  Service: Cardiovascular;  Laterality: Right;   NASAL SINUS SURGERY  2008   septpolasty, bilateral turbinate reduction   SHOULDER ARTHROSCOPY  2012   TEE WITHOUT CARDIOVERSION N/A  01/24/2015   Procedure: TRANSESOPHAGEAL ECHOCARDIOGRAM (TEE);  Surgeon: [name], MD;  Location: [facility];  Service: Open Heart Surgery;  Laterality: N/A;   TOE SURGERY  1994   UPPER GI ENDOSCOPY  07/2014, 04-24-16   Dr Raquel James   WRIST SURGERY  2011    SOCIAL HISTORY: Social History   Socioeconomic History   Marital status: Single    Spouse name: Not on file   Number of children: Not on file   Years of education: 12   Highest education level: Not on file  Occupational History   Occupation: retired    Comment: [employer]  Tobacco Use   Smoking status: Former    Packs/day: 2.50    Years: 40.00    Pack years: 100.00    Types: Cigarettes    Quit date: 03/24/2000     Years since quitting: 20.7   Smokeless tobacco: Never  Vaping Use   Vaping Use: Never used  Substance and Sexual Activity   Alcohol use: Yes    Comment: occ   Drug use: No   Sexual activity: Yes  Other Topics Concern   Not on file  Social History Narrative   Singled; lives with son and dog    Occ: retired, back part time at [employer];    Activity: gym 4-5d/wk   Diet: good water, fruits/vegetables daily   Caffeine Use-yes      ------------------------------------       Car sales- retd; [employer]- retd; quit smoking 2001. Alcohol couple nights a week. Live in [place]. Daughter lives 10 mins.    Social Determinants of Health   Financial Resource Strain: Low Risk    Difficulty of Paying Living Expenses: Not hard at all  Food Insecurity: No Food Insecurity   Worried About Charity fundraiser in the Last Year: Never true   [place] in the Last Year: Never true  Transportation Needs: No Transportation Needs   Lack of Transportation (Medical): No   Lack of Transportation (Non-Medical): No  Physical Activity: Sufficiently Active   Days of Exercise per Week: 7 days   Minutes of Exercise per Session: 60 min  Stress: No Stress Concern Present   Feeling of Stress : Not at all  Social Connections: Not on file  Intimate Partner Violence: Not At Risk   Fear of Current or Ex-Partner: No   Emotionally Abused: No   Physically Abused: No   Sexually Abused: No    FAMILY HISTORY: Family History  Problem Relation Age of Onset   Hypertension Mother    Heart disease Mother    Hypertension Father    Diabetes Father    Heart disease Brother 37   Cancer Paternal Grandfather    Lymphoma Sister    Colon cancer Neg Hx    Prostate cancer Neg Hx    Bladder Cancer Neg Hx    Kidney cancer Neg Hx     ALLERGIES:  is allergic to flomax [tamsulosin].  MEDICATIONS:  Current Outpatient Medications  Medication Sig Dispense Refill   Ascorbic Acid (VITAMIN C) 1000 MG tablet Take 1,000  mg by mouth daily.     aspirin 81 MG EC tablet Take 81 mg by mouth daily.       Calcium-Magnesium-Vitamin D (CALCIUM 1200+D3 PO) Take 1 tablet by mouth daily.     Carboxymethylcellul-Glycerin (LUBRICATING EYE DROPS OP) Place 1 drop into both eyes daily as needed (irritation).     Cholecalciferol (VITAMIN D3) 50 MCG (  2000 UT) TABS Take 2,000 Units by mouth daily.     Cyanocobalamin (B-12) 5000 MCG CAPS Take 5,000 mcg by mouth daily.     docusate sodium (COLACE) 100 MG capsule Take 100 mg by mouth at bedtime.     lisinopril (ZESTRIL) 5 MG tablet TAKE 1 TABLET BY MOUTH DAILY (Patient taking differently: Take 5 mg by mouth daily.) 90 tablet 3   Melatonin 10 MG CAPS Take 10 mg by mouth at bedtime as needed (sleep).     metoprolol succinate (TOPROL-XL) 25 MG 24 hr tablet TAKE 1/2 TABLET (12.5 MG) BY MOUTH DAILY (Patient taking differently: Take 12.5 mg by mouth daily.) 45 tablet 3   Niacinamide-Zn-Cu-Methfo-Se-Cr (NICOTINAMIDE PO) Take 500 mg by mouth daily.     ondansetron (ZOFRAN) 8 MG tablet One pill every 8 hours as needed for nausea/vomitting. 40 tablet 1   pantoprazole (PROTONIX) 40 MG tablet Take 1 tablet (40 mg total) by mouth daily. (Patient taking differently: Take 40 mg by mouth at bedtime.) 90 tablet 3   Polyethyl Glycol-Propyl Glycol (LUBRICANT EYE DROPS) 0.4-0.3 % SOLN Place 1-2 drops into both eyes 3 (three) times daily as needed (burning eyes.).     simvastatin (ZOCOR) 40 MG tablet Take 1 tablet (40 mg total) by mouth at bedtime. 90 tablet 0   traMADol (ULTRAM) 50 MG tablet TAKE 2 TABLETS BY MOUTH DAILY AS NEEDED 60 tablet 0   Vitamin E 450 MG (1000 UT) CAPS Take 1,000 Units by mouth daily.     Zinc 50 MG TABS Take 50 mg by mouth daily.     ibuprofen (ADVIL) 200 MG tablet Take 400-600 mg by mouth every 6 (six) hours as needed for moderate pain. (Patient not taking: Reported on 11/15/2020)     lidocaine-prilocaine (EMLA) cream Apply 30 -45 mins prior to port access. (Patient not taking:  Reported on 11/15/2020) 30 g 0   nitroGLYCERIN (NITROSTAT) 0.4 MG SL tablet Place 1 tablet (0.4 mg total) under the tongue every 5 (five) minutes as needed for chest pain. (Patient not taking: No sig reported) 25 tablet 3   NONFORMULARY OR COMPOUNDED ITEM Trimix (30/1/10)-(Pap/Phent/PGE)  Test Dose 3 32ml vials  Qty #3 Refills 0  [facility] [phone] Fax [phone] 3 each 0   prochlorperazine (COMPAZINE) 10 MG tablet Take 1 tablet (10 mg total) by mouth every 6 (six) hours as needed for nausea or vomiting. (Patient not taking: Reported on 11/15/2020) 40 tablet 1   No current facility-administered medications for this visit.      [name]  PHYSICAL EXAMINATION: ECOG PERFORMANCE STATUS: 0 - Asymptomatic  Vitals:   11/15/20 1312  BP: (!) 147/71  Pulse: 63  Temp: (!) 96.6 F (35.9 C)  SpO2: 96%   Filed Weights   11/15/20 1312  Weight: 201 lb 9.6 oz (91.4 kg)    Physical Exam Constitutional:      Comments: Ambulatory independently; accompanied by his daughter  HENT:     Head: Normocephalic and atraumatic.     Mouth/Throat:     Pharynx: No oropharyngeal exudate.  Eyes:     Pupils: Pupils are equal, round, and reactive to light.  Cardiovascular:     Rate and Rhythm: Normal rate and regular rhythm.  Pulmonary:     Effort: No respiratory distress.     Breath sounds: No wheezing.  Abdominal:     General: Bowel sounds are normal. There is no distension.     Palpations: Abdomen is soft. There is no  mass.     Tenderness: no abdominal tenderness There is no guarding or rebound.  Musculoskeletal:        General: No tenderness. Normal range of motion.     Cervical back: Normal range of motion and neck supple.  Skin:    General: Skin is warm.  Neurological:     Mental Status: He is alert and oriented to person, place, and time.  Psychiatric:        Mood and Affect: Affect normal.     LABORATORY DATA:  I have reviewed the data as listed Lab Results  Component  Value Date   WBC 38.9 (H) 11/26/2020   HGB 13.8 11/26/2020   HCT 42.1 11/26/2020   MCV 91.3 11/26/2020   PLT 250 11/26/2020   Recent Labs    03/15/20 0804 10/08/20 1147 10/12/20 1019 11/15/20 1232 11/26/20 1107  NA 140  --  137 135 135  K 4.5  --  4.4 4.3 4.3  CL 105  --  104 102 100  CO2 28  --  22 23 25   GLUCOSE 90  --  101* 100* 100*  BUN 24*  --  23 24* 15  CREATININE 1.14   < > 0.84 0.90 0.90  CALCIUM 9.6  --  9.1 8.9 9.2  GFRNONAA  --   --  >60 >60 >60  PROT 6.4  --  7.0 7.1  --   ALBUMIN 4.3  --  4.0 3.9  --   AST 11  --  16 12*  --   ALT 14  --  16 13  --   ALKPHOS 51  --  56 60  --   BILITOT 0.6  --  0.7 0.3  --    < > = values in this interval not displayed.    RADIOGRAPHIC STUDIES: I have personally reviewed the radiological images as listed and agreed with the findings in the report. MR Abdomen W Wo Contrast  Result Date: 11/19/2020 CLINICAL DATA:  History of lung cancer with metastatic nodule adjacent to the superior pole of the right kidney, previously biopsied EXAM: MRI ABDOMEN WITHOUT AND WITH CONTRAST TECHNIQUE: Multiplanar multisequence MR imaging of the abdomen was performed both before and after the administration of intravenous contrast. CONTRAST:  24mL GADAVIST GADOBUTROL 1 MMOL/ML IV SOLN COMPARISON:  CT guided biopsy, 11/06/2020, PET-CT, 10/17/2020 FINDINGS: Lower chest: No acute findings. Hepatobiliary: Multiple fluid signal, nonenhancing lesions of the liver, consistent with small cysts. No mass or other parenchymal abnormality identified. No biliary ductal dilatation. Pancreas: No mass, inflammatory changes, or other parenchymal abnormality identified. Spleen:  Within normal limits in size and appearance. Adrenals/Urinary Tract: Redemonstrated, rim enhancing mass adjacent to the superior pole of the right kidney, measuring 3.7 x 3.2 cm, significantly enlarged even in comparison to recent prior CT guided biopsy dated 11/06/2020, as well as prior PET-CT  dated 10/17/2020. (Series 15, image 40, series 19, image 22). No evidence of hydronephrosis. Stomach/Bowel: Visualized portions within the abdomen are unremarkable. Vascular/Lymphatic: No pathologically enlarged lymph nodes identified. No abdominal aortic aneurysm demonstrated. Aortic atherosclerosis. Other:  None. Musculoskeletal: No suspicious bone lesions identified. IMPRESSION: 1. Redemonstrated rim enhancing mass adjacent to the superior pole of the right kidney, measuring 3.7 x 3.2 cm, significantly enlarged even in comparison to recent prior CT guided biopsy dated 11/06/2020, as well as prior PET-CT dated 10/17/2020. Findings are consistent with aggressively enlarging lung cancer metastasis, biopsy proven. 2. No additional evidence of metastatic disease within the abdomen. Aortic  Atherosclerosis (ICD10-I70.0). Electronically Signed   By: Eddie Candle M.D.   On: 11/19/2020 16:39   CT BIOPSY  Result Date: 11/06/2020 INDICATION: 80 year old gentleman with lung malignancy and enlarging right retroperitoneal soft tissue implant presents to interventional radiology for CT-guided biopsy. EXAM: CT-guided biopsy of right retroperitoneal implant MEDICATIONS: None. ANESTHESIA/SEDATION: Moderate (conscious) sedation was employed during this procedure. A total of Versed 1.5 mg and Fentanyl 75 mcg was administered intravenously. Moderate Sedation Time: 22 minutes. [name] level of consciousness and vital signs were monitored continuously by radiology nursing throughout the procedure under my direct supervision. COMPLICATIONS: None immediate. PROCEDURE: Informed written consent was obtained from the patient after a thorough discussion of the procedural risks, benefits and alternatives. All questions were addressed. Maximal Sterile Barrier Technique was utilized including caps, mask, sterile gowns, sterile gloves, sterile drape, hand hygiene and skin antiseptic. A timeout was performed prior to the initiation of  the procedure. Patient positioned supine on the CT table. The right lateral abdominal wall skin prepped and draped in usual fashion. Following local lidocaine administration, 17 gauge introducer needle was advanced into the right retroperitoneal mass by transhepatic approach utilizing CT guidance. 4-18 gauge cores were obtained from the right retroperitoneal mass. All samples were sent to pathology in formalin. Postprocedure CT demonstrated no significant hemorrhage or pneumothorax. IMPRESSION: CT-guided biopsy of right retroperitoneal mass. Electronically Signed   By: Miachel Roux M.D.   On: 11/06/2020 13:48   IR IMAGING GUIDED PORT INSERTION  Result Date: 11/09/2020 INDICATION: 80 year old with left lung cancer.  Port needed for chemotherapy. EXAM: FLUOROSCOPIC AND ULTRASOUND GUIDED PLACEMENT OF A SUBCUTANEOUS PORT COMPARISON:  None. MEDICATIONS: Moderate sedation ANESTHESIA/SEDATION: Versed 2.0 mg IV; Fentanyl 100 mcg IV; Moderate Sedation Time:  40 minutes The patient was continuously monitored during the procedure by the interventional radiology nurse under my direct supervision. FLUOROSCOPY TIME:  18 seconds, 5.95 mGy COMPLICATIONS: None immediate. PROCEDURE: The procedure, risks, benefits, and alternatives were explained to the patient. Questions regarding the procedure were encouraged and answered. The patient understands and consents to the procedure. Patient was placed supine on the interventional table. Ultrasound confirmed a patent right internal jugular vein. Ultrasound image was saved for documentation. The right chest and neck were cleaned with a skin antiseptic and a sterile drape was placed. Maximal barrier sterile technique was utilized including caps, mask, sterile gowns, sterile gloves, sterile drape, hand hygiene and skin antiseptic. The right neck was anesthetized with 1% lidocaine with epinephrine. Small incision was made in the right neck with a blade. Micropuncture set was placed in the  right internal jugular vein with ultrasound guidance. The micropuncture wire was used for measurement purposes. The right chest was anesthetized with 1% lidocaine with epinephrine. #15 blade was used to make an incision and a subcutaneous port pocket was formed. [place] was assembled. Subcutaneous tunnel was formed with a stiff tunneling device. The port catheter was brought through the subcutaneous tunnel. The port was placed in the subcutaneous pocket. The micropuncture set was exchanged for a peel-away sheath. The catheter was placed through the peel-away sheath and the tip was positioned at the superior cavoatrial junction. Catheter placement was confirmed with fluoroscopy. The port was accessed and flushed with heparinized saline. The port pocket was closed using two layers of absorbable sutures and Dermabond. The vein skin site was closed using a single layer of absorbable suture and Dermabond. Sterile dressings were applied. Patient tolerated the procedure well without an immediate complication. Ultrasound and fluoroscopic  images were taken and saved for this procedure. IMPRESSION: Placement of a subcutaneous port device. Catheter tip at the superior cavoatrial junction. Electronically Signed   By: Markus Daft M.D.   On: 11/09/2020 11:54     ASSESSMENT & PLAN:   Cancer of lower lobe of left lung ([facility]CC) #Lung cancer-non-small cell-T3N1 vs stage IV [right suprarenal nodule-s/p biopsy on 5/31-see below].  MRI brain negative for malignancy. NGS- P  #Proceed with chemoimmunotherapy-CarboTaxol Keytruda.  Again reviewed the potential side effects including but not limited to nausea vomiting fatigue/immune mediated side effects. Labs today reviewed;  acceptable for treatment today.   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  Await abdominal MRI.  # CAD-s/p CABG/ PVD- .  No CHF.  STABLE.   # DISPOSITION: # MRI abdomen ASAP # as  planned chemo tomorrow- [carbo-taxol-keytruda]; 6/13 fuphila # labs in 10 days- cbc/bmp # follow up in 3 weeks- MD; labs- cbc/cmp;  [carbo-taxol-keytruda]; d-2; ffuphila Dr.B     All  questions were answered. The patient knows to call the clinic with any problems, questions or concerns.    Cammie Sickle, MD 12/02/2020 6:23 PM

## 2020-11-16 ENCOUNTER — Encounter: Payer: Self-pay | Admitting: *Deleted

## 2020-11-16 ENCOUNTER — Inpatient Hospital Stay: Payer: PPO

## 2020-11-16 ENCOUNTER — Encounter: Payer: Self-pay | Admitting: Internal Medicine

## 2020-11-16 ENCOUNTER — Inpatient Hospital Stay: Payer: PPO | Admitting: Nurse Practitioner

## 2020-11-16 VITALS — BP 143/72 | HR 81 | Temp 97.0°F | Resp 18

## 2020-11-16 DIAGNOSIS — Z5112 Encounter for antineoplastic immunotherapy: Secondary | ICD-10-CM | POA: Diagnosis not present

## 2020-11-16 DIAGNOSIS — C3432 Malignant neoplasm of lower lobe, left bronchus or lung: Secondary | ICD-10-CM

## 2020-11-16 MED ORDER — HEPARIN SOD (PORK) LOCK FLUSH 100 UNIT/ML IV SOLN
500.0000 [IU] | Freq: Once | INTRAVENOUS | Status: AC | PRN
  Administered 2020-11-16: 500 [IU]
  Filled 2020-11-16: qty 5

## 2020-11-16 MED ORDER — SODIUM CHLORIDE 0.9 % IV SOLN
10.0000 mg | Freq: Once | INTRAVENOUS | Status: AC
  Administered 2020-11-16: 10 mg via INTRAVENOUS
  Filled 2020-11-16: qty 10

## 2020-11-16 MED ORDER — SODIUM CHLORIDE 0.9 % IV SOLN
150.0000 mg | Freq: Once | INTRAVENOUS | Status: AC
  Administered 2020-11-16: 150 mg via INTRAVENOUS
  Filled 2020-11-16: qty 150

## 2020-11-16 MED ORDER — FAMOTIDINE 20 MG IN NS 100 ML IVPB
20.0000 mg | Freq: Once | INTRAVENOUS | Status: AC
  Administered 2020-11-16: 20 mg via INTRAVENOUS
  Filled 2020-11-16: qty 20

## 2020-11-16 MED ORDER — HEPARIN SOD (PORK) LOCK FLUSH 100 UNIT/ML IV SOLN
INTRAVENOUS | Status: AC
  Filled 2020-11-16: qty 5

## 2020-11-16 MED ORDER — SODIUM CHLORIDE 0.9 % IV SOLN
526.5000 mg | Freq: Once | INTRAVENOUS | Status: AC
  Administered 2020-11-16: 530 mg via INTRAVENOUS
  Filled 2020-11-16: qty 53

## 2020-11-16 MED ORDER — SODIUM CHLORIDE 0.9 % IV SOLN
Freq: Once | INTRAVENOUS | Status: AC
  Filled 2020-11-16: qty 250

## 2020-11-16 MED ORDER — PALONOSETRON HCL INJECTION 0.25 MG/5ML
0.2500 mg | Freq: Once | INTRAVENOUS | Status: AC
  Administered 2020-11-16: 0.25 mg via INTRAVENOUS
  Filled 2020-11-16: qty 5

## 2020-11-16 MED ORDER — DIPHENHYDRAMINE HCL 50 MG/ML IJ SOLN
50.0000 mg | Freq: Once | INTRAMUSCULAR | Status: AC
  Administered 2020-11-16: 50 mg via INTRAVENOUS
  Filled 2020-11-16: qty 1

## 2020-11-16 MED ORDER — SODIUM CHLORIDE 0.9 % IV SOLN
200.0000 mg/m2 | Freq: Once | INTRAVENOUS | Status: AC
  Administered 2020-11-16: 420 mg via INTRAVENOUS
  Filled 2020-11-16: qty 70

## 2020-11-16 MED ORDER — SODIUM CHLORIDE 0.9 % IV SOLN
200.0000 mg | Freq: Once | INTRAVENOUS | Status: AC
  Administered 2020-11-16: 200 mg via INTRAVENOUS
  Filled 2020-11-16: qty 8

## 2020-11-16 NOTE — Patient Instructions (Signed)
[facility]   Discharge Instructions: Thank you for choosing [facility] to provide your oncology and hematology care.  If you have a lab appointment with the [facility], please go directly to the [facility] and check in at the registration area.  Wear comfortable clothing and clothing appropriate for easy access to any Portacath or PICC line.   We strive to give you quality time with your provider. You may need to reschedule your appointment if you arrive late (15 or more minutes).  Arriving late affects you and other patients whose appointments are after yours.  Also, if you miss three or more appointments without notifying the office, you may be dismissed from the clinic at the provider's discretion.      For prescription refill requests, have your pharmacy contact our office and allow 72 hours for refills to be completed.    Today you received the following chemotherapy and/or immunotherapy agents Carboplatin, taxol, Keytruda       To help prevent nausea and vomiting after your treatment, we encourage you to take your nausea medication as directed.  BELOW ARE SYMPTOMS THAT SHOULD BE REPORTED IMMEDIATELY: *FEVER GREATER THAN 100.4 F (38 C) OR HIGHER *CHILLS OR SWEATING *NAUSEA AND VOMITING THAT IS NOT CONTROLLED WITH YOUR NAUSEA MEDICATION *UNUSUAL SHORTNESS OF BREATH *UNUSUAL BRUISING OR BLEEDING *URINARY PROBLEMS (pain or burning when urinating, or frequent urination) *BOWEL PROBLEMS (unusual diarrhea, constipation, pain near the anus) TENDERNESS IN MOUTH AND THROAT WITH OR WITHOUT PRESENCE OF ULCERS (sore throat, sores in mouth, or a toothache) UNUSUAL RASH, SWELLING OR PAIN  UNUSUAL VAGINAL DISCHARGE OR ITCHING   Items with * indicate a potential emergency and should be followed up as soon as possible or go to the Emergency Department if any problems should occur.  Please show the CHEMOTHERAPY ALERT CARD or IMMUNOTHERAPY  ALERT CARD at check-in to the Emergency Department and triage nurse.  Should you have questions after your visit or need to cancel or reschedule your appointment, please contact [facility]  [phone] and follow the prompts.  Office hours are 8:00 a.m. to 4:30 p.m. Monday - Friday. Please note that voicemails left after 4:00 p.m. may not be returned until the following business day.  We are closed weekends and major holidays. You have access to a nurse at all times for urgent questions. Please call the main number to the clinic [phone] and follow the prompts.  For any non-urgent questions, you may also contact your provider using MyChart. We now offer e-Visits for anyone 80 and older to request care online for non-urgent symptoms. For details visit [email or web address].   Also download the MyChart app! Go to the app store, search "MyChart", open the app, select [facility], and log in with your MyChart username and password.  Due to Covid, a mask is required upon entering the hospital/clinic. If you do not have a mask, one will be given to you upon arrival. For doctor visits, patients may have 1 support person aged 44 or older with them. For treatment visits, patients cannot have anyone with them due to current Covid guidelines and our immunocompromised population.

## 2020-11-16 NOTE — Progress Notes (Signed)
Met patient in infusion area while receiving first chemo treatment today. Reviewed upcoming appts with patient. No questions or needs at this time. Instructed to call with any needs. Pt verbalized understanding.

## 2020-11-16 NOTE — Progress Notes (Signed)
Pt tolerated chemotherapy infusion well with no signs of complications. RN educated pt on the importance of notifying the clinic if any complications occurs at home and when to seek emergency care, pt verbalized understanding and all questions answered at this time. VSS. Pt stable for discharge.   [name] [employer]

## 2020-11-19 ENCOUNTER — Inpatient Hospital Stay: Payer: PPO

## 2020-11-19 ENCOUNTER — Encounter: Payer: Self-pay | Admitting: Internal Medicine

## 2020-11-19 ENCOUNTER — Other Ambulatory Visit: Payer: Self-pay

## 2020-11-19 ENCOUNTER — Ambulatory Visit
Admission: RE | Admit: 2020-11-19 | Discharge: 2020-11-19 | Disposition: A | Discharge status: Home / Self Care | Payer: PPO | Source: Ambulatory Visit | Attending: Internal Medicine | Admitting: Internal Medicine

## 2020-11-19 DIAGNOSIS — Z5112 Encounter for antineoplastic immunotherapy: Secondary | ICD-10-CM | POA: Diagnosis not present

## 2020-11-19 DIAGNOSIS — Z85528 Personal history of other malignant neoplasm of kidney: Secondary | ICD-10-CM | POA: Diagnosis not present

## 2020-11-19 DIAGNOSIS — K769 Liver disease, unspecified: Secondary | ICD-10-CM | POA: Diagnosis not present

## 2020-11-19 DIAGNOSIS — N2889 Other specified disorders of kidney and ureter: Secondary | ICD-10-CM | POA: Diagnosis not present

## 2020-11-19 DIAGNOSIS — C3432 Malignant neoplasm of lower lobe, left bronchus or lung: Secondary | ICD-10-CM

## 2020-11-19 DIAGNOSIS — C649 Malignant neoplasm of unspecified kidney, except renal pelvis: Secondary | ICD-10-CM | POA: Insufficient documentation

## 2020-11-19 DIAGNOSIS — Z85118 Personal history of other malignant neoplasm of bronchus and lung: Secondary | ICD-10-CM | POA: Diagnosis not present

## 2020-11-19 MED ORDER — GADOBUTROL 1 MMOL/ML IV SOLN
10.0000 mL | Freq: Once | INTRAVENOUS | Status: AC | PRN
  Administered 2020-11-19: 10 mL via INTRAVENOUS

## 2020-11-19 MED ORDER — PEGFILGRASTIM-JMDB 6 MG/0.6ML ~~LOC~~ SOSY
6.0000 mg | PREFILLED_SYRINGE | Freq: Once | SUBCUTANEOUS | Status: AC
Start: 2020-11-19 — End: 2020-11-19
  Administered 2020-11-19: 6 mg via SUBCUTANEOUS
  Filled 2020-11-19: qty 0.6

## 2020-11-20 ENCOUNTER — Telehealth: Payer: Self-pay

## 2020-11-20 NOTE — Telephone Encounter (Signed)
Telephone call to patient for follow up after receiving first infusion.   Patient states infusion went great.  States eating good and drinking plenty of fluids.   Denies any nausea or vomiting.  Encouraged patient to call for any concerns or questions. 

## 2020-11-22 ENCOUNTER — Other Ambulatory Visit: Payer: PPO

## 2020-11-23 NOTE — Progress Notes (Signed)
error 

## 2020-11-23 NOTE — Progress Notes (Signed)
Tumor Board Documentation  Elmus Mathes. was presented by Dr Rogue Bussing at our Tumor Board on 11/22/2020, which included representatives from medical oncology, radiation oncology, internal medicine, navigation, pathology, radiology, surgical, pharmacy, research, palliative care, pulmonology.  Casy currently presents as a current patient, for [facility], for new positive pathology with history of the following treatments: surgical intervention(s), active survellience.  Additionally, we reviewed previous medical and familial history, history of present illness, and recent lab results along with all available histopathologic and imaging studies. The tumor board considered available treatment options and made the following recommendations: Additional screening, Immunotherapy, Chemotherapy (Foundation One Testing)    The following procedures/referrals were also placed: No orders of the defined types were placed in this encounter.   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  Tumor board is a meeting of clinicians from various specialty areas who evaluate and discuss patients for whom a multidisciplinary approach is being considered. Final determinations in the plan of care are those of the provider(s). The responsibility for follow up of recommendations given during tumor board is that of the provider.   Today's extended care, comprehensive team conference, Kaydenn was not present for the discussion and was not examined.   Multidisciplinary Tumor Board is a multidisciplinary case peer review process.  Decisions discussed in the Multidisciplinary Tumor Board reflect the opinions of the specialists present at the conference without having examined the patient.  Ultimately, treatment and diagnostic decisions rest with the  primary provider(s) and the patient.

## 2020-11-26 ENCOUNTER — Other Ambulatory Visit: Payer: Self-pay

## 2020-11-26 ENCOUNTER — Inpatient Hospital Stay: Payer: PPO

## 2020-11-26 DIAGNOSIS — Z5112 Encounter for antineoplastic immunotherapy: Secondary | ICD-10-CM | POA: Diagnosis not present

## 2020-11-26 DIAGNOSIS — C3432 Malignant neoplasm of lower lobe, left bronchus or lung: Secondary | ICD-10-CM

## 2020-11-26 LAB — CBC WITH DIFFERENTIAL/PLATELET
Abs Immature Granulocytes: 6.6 10*3/uL — ABNORMAL HIGH (ref 0.00–0.07)
Basophils Absolute: 0.1 10*3/uL (ref 0.0–0.1)
Basophils Relative: 0 %
Eosinophils Absolute: 0.2 10*3/uL (ref 0.0–0.5)
Eosinophils Relative: 0 %
HCT: 42.1 % (ref 39.0–52.0)
Hemoglobin: 13.8 g/dL (ref 13.0–17.0)
Immature Granulocytes: 17 %
Lymphocytes Relative: 8 %
Lymphs Abs: 3 10*3/uL (ref 0.7–4.0)
MCH: 29.9 pg (ref 26.0–34.0)
MCHC: 32.8 g/dL (ref 30.0–36.0)
MCV: 91.3 fL (ref 80.0–100.0)
Monocytes Absolute: 2.8 10*3/uL — ABNORMAL HIGH (ref 0.1–1.0)
Monocytes Relative: 7 %
Neutro Abs: 26.1 10*3/uL — ABNORMAL HIGH (ref 1.7–7.7)
Neutrophils Relative %: 68 %
Platelets: 250 10*3/uL (ref 150–400)
RBC: 4.61 MIL/uL (ref 4.22–5.81)
RDW: 13.1 % (ref 11.5–15.5)
Smear Review: NORMAL
WBC: 38.9 10*3/uL — ABNORMAL HIGH (ref 4.0–10.5)
nRBC: 0.2 % (ref 0.0–0.2)

## 2020-11-26 LAB — BASIC METABOLIC PANEL
Anion gap: 10 (ref 5–15)
BUN: 15 mg/dL (ref 8–23)
CO2: 25 mmol/L (ref 22–32)
Calcium: 9.2 mg/dL (ref 8.9–10.3)
Chloride: 100 mmol/L (ref 98–111)
Creatinine, Ser: 0.9 mg/dL (ref 0.61–1.24)
GFR, Estimated: >60 mL/min (ref >60)
Glucose, Bld: 100 mg/dL — ABNORMAL HIGH (ref 70–99)
Potassium: 4.3 mmol/L (ref 3.5–5.1)
Sodium: 135 mmol/L (ref 135–145)

## 2020-11-27 DIAGNOSIS — M9905 Segmental and somatic dysfunction of pelvic region: Secondary | ICD-10-CM | POA: Diagnosis not present

## 2020-11-27 DIAGNOSIS — M62830 Muscle spasm of back: Secondary | ICD-10-CM | POA: Diagnosis not present

## 2020-11-27 DIAGNOSIS — M9903 Segmental and somatic dysfunction of lumbar region: Secondary | ICD-10-CM | POA: Diagnosis not present

## 2020-11-27 DIAGNOSIS — M955 Acquired deformity of pelvis: Secondary | ICD-10-CM | POA: Diagnosis not present

## 2020-12-06 ENCOUNTER — Other Ambulatory Visit: Payer: Self-pay

## 2020-12-06 ENCOUNTER — Inpatient Hospital Stay: Payer: PPO

## 2020-12-06 ENCOUNTER — Inpatient Hospital Stay: Payer: PPO | Admitting: Internal Medicine

## 2020-12-06 ENCOUNTER — Telehealth: Payer: Self-pay

## 2020-12-06 DIAGNOSIS — C3432 Malignant neoplasm of lower lobe, left bronchus or lung: Secondary | ICD-10-CM | POA: Diagnosis not present

## 2020-12-06 DIAGNOSIS — Z95828 Presence of other vascular implants and grafts: Secondary | ICD-10-CM

## 2020-12-06 DIAGNOSIS — N2889 Other specified disorders of kidney and ureter: Secondary | ICD-10-CM

## 2020-12-06 DIAGNOSIS — Z5112 Encounter for antineoplastic immunotherapy: Secondary | ICD-10-CM | POA: Diagnosis not present

## 2020-12-06 LAB — COMPREHENSIVE METABOLIC PANEL
ALT: 17 U/L (ref 0–44)
AST: 16 U/L (ref 15–41)
Albumin: 4 g/dL (ref 3.5–5.0)
Alkaline Phosphatase: 72 U/L (ref 38–126)
Anion gap: 7 (ref 5–15)
BUN: 19 mg/dL (ref 8–23)
CO2: 26 mmol/L (ref 22–32)
Calcium: 9.5 mg/dL (ref 8.9–10.3)
Chloride: 104 mmol/L (ref 98–111)
Creatinine, Ser: 0.96 mg/dL (ref 0.61–1.24)
GFR, Estimated: >60 mL/min (ref >60)
Glucose, Bld: 103 mg/dL — ABNORMAL HIGH (ref 70–99)
Potassium: 4.3 mmol/L (ref 3.5–5.1)
Sodium: 137 mmol/L (ref 135–145)
Total Bilirubin: 0.8 mg/dL (ref 0.3–1.2)
Total Protein: 7.3 g/dL (ref 6.5–8.1)

## 2020-12-06 LAB — CBC WITH DIFFERENTIAL/PLATELET
Abs Immature Granulocytes: 0.17 10*3/uL — ABNORMAL HIGH (ref 0.00–0.07)
Basophils Absolute: 0.1 10*3/uL (ref 0.0–0.1)
Basophils Relative: 1 %
Eosinophils Absolute: 0 10*3/uL (ref 0.0–0.5)
Eosinophils Relative: 0 %
HCT: 41.7 % (ref 39.0–52.0)
Hemoglobin: 14 g/dL (ref 13.0–17.0)
Immature Granulocytes: 2 %
Lymphocytes Relative: 14 %
Lymphs Abs: 1.5 10*3/uL (ref 0.7–4.0)
MCH: 30.5 pg (ref 26.0–34.0)
MCHC: 33.6 g/dL (ref 30.0–36.0)
MCV: 90.8 fL (ref 80.0–100.0)
Monocytes Absolute: 0.8 10*3/uL (ref 0.1–1.0)
Monocytes Relative: 8 %
Neutro Abs: 7.5 10*3/uL (ref 1.7–7.7)
Neutrophils Relative %: 75 %
Platelets: 282 10*3/uL (ref 150–400)
RBC: 4.59 MIL/uL (ref 4.22–5.81)
RDW: 13.4 % (ref 11.5–15.5)
WBC: 10.2 10*3/uL (ref 4.0–10.5)
nRBC: 0 % (ref 0.0–0.2)

## 2020-12-06 MED ORDER — SODIUM CHLORIDE 0.9 % IV SOLN
Freq: Once | INTRAVENOUS | Status: AC
  Filled 2020-12-06: qty 250

## 2020-12-06 MED ORDER — SODIUM CHLORIDE 0.9 % IV SOLN
150.0000 mg | Freq: Once | INTRAVENOUS | Status: AC
  Administered 2020-12-06: 150 mg via INTRAVENOUS
  Filled 2020-12-06: qty 150

## 2020-12-06 MED ORDER — SODIUM CHLORIDE 0.9% FLUSH
10.0000 mL | INTRAVENOUS | Status: DC | PRN
  Administered 2020-12-06: 10 mL
  Filled 2020-12-06: qty 10

## 2020-12-06 MED ORDER — SODIUM CHLORIDE 0.9 % IV SOLN
526.5000 mg | Freq: Once | INTRAVENOUS | Status: AC
  Administered 2020-12-06: 530 mg via INTRAVENOUS
  Filled 2020-12-06: qty 53

## 2020-12-06 MED ORDER — SODIUM CHLORIDE 0.9 % IV SOLN
10.0000 mg | Freq: Once | INTRAVENOUS | Status: AC
  Administered 2020-12-06: 10 mg via INTRAVENOUS
  Filled 2020-12-06: qty 10

## 2020-12-06 MED ORDER — HEPARIN SOD (PORK) LOCK FLUSH 100 UNIT/ML IV SOLN
500.0000 [IU] | Freq: Once | INTRAVENOUS | Status: DC
  Filled 2020-12-06: qty 5

## 2020-12-06 MED ORDER — SODIUM CHLORIDE 0.9 % IV SOLN
200.0000 mg | Freq: Once | INTRAVENOUS | Status: AC
  Administered 2020-12-06: 200 mg via INTRAVENOUS
  Filled 2020-12-06: qty 8

## 2020-12-06 MED ORDER — PALONOSETRON HCL INJECTION 0.25 MG/5ML
0.2500 mg | Freq: Once | INTRAVENOUS | Status: AC
Start: 2020-12-06 — End: 2020-12-06
  Administered 2020-12-06: 0.25 mg via INTRAVENOUS
  Filled 2020-12-06: qty 5

## 2020-12-06 MED ORDER — HEPARIN SOD (PORK) LOCK FLUSH 100 UNIT/ML IV SOLN
500.0000 [IU] | Freq: Once | INTRAVENOUS | Status: AC | PRN
  Administered 2020-12-06: 500 [IU]
  Filled 2020-12-06: qty 5

## 2020-12-06 MED ORDER — SODIUM CHLORIDE 0.9% FLUSH
10.0000 mL | Freq: Once | INTRAVENOUS | Status: AC
Start: 2020-12-06 — End: 2020-12-06
  Administered 2020-12-06: 10 mL via INTRAVENOUS
  Filled 2020-12-06: qty 10

## 2020-12-06 MED ORDER — HEPARIN SOD (PORK) LOCK FLUSH 100 UNIT/ML IV SOLN
INTRAVENOUS | Status: AC
  Filled 2020-12-06: qty 5

## 2020-12-06 MED ORDER — DIPHENHYDRAMINE HCL 50 MG/ML IJ SOLN
50.0000 mg | Freq: Once | INTRAMUSCULAR | Status: AC
  Administered 2020-12-06: 50 mg via INTRAVENOUS
  Filled 2020-12-06: qty 1

## 2020-12-06 MED ORDER — PACLITAXEL CHEMO INJECTION 300 MG/50ML
200.0000 mg/m2 | Freq: Once | INTRAVENOUS | Status: AC
  Administered 2020-12-06: 420 mg via INTRAVENOUS
  Filled 2020-12-06: qty 70

## 2020-12-06 MED ORDER — FAMOTIDINE 20 MG IN NS 100 ML IVPB
20.0000 mg | Freq: Once | INTRAVENOUS | Status: AC
  Administered 2020-12-06: 20 mg via INTRAVENOUS
  Filled 2020-12-06: qty 20

## 2020-12-06 NOTE — Progress Notes (Signed)
[place] NOTE  Patient Care Team: Ria Bush, MD as PCP - General (Family Medicine) [name] [name], MD as PCP - Cardiology (Cardiology) [name], MD (Internal Medicine) Minna Merritts, MD as Consulting Physician (Cardiology) Pieter Partridge, DO as Consulting Physician (Neurology) Cammie Sickle, MD as Consulting Physician (Hematology and Oncology) Telford Nab, RN as Oncology Nurse Navigator  CHIEF COMPLAINTS/PURPOSE OF CONSULTATION: lung cancer    Oncology History Overview Note  #MAY 2022-Lung cancer-non-small cell [CT guided bx] T3N1 vs stage IV Dr.Hendrickson.  MRI brain negative for malignancy.# 1. April 2022- LLL ~4.0 cm mass in the superior segment left lower lobe abuts the major fissure and the posterior pleural surface without visible chest wall invasion without pleural effusion. There 2-3 other small nodules in the left lower lobe, largest measures 9 mm in diameter. These are suspicious for same lobe satellite lesions and there is left hilar adenopathy. Assuming non-small cell lung cancer the appearance is compatible with T3 N1 M0 disease (stage IIIA).  # right suprarenal nodule-awaiting biopsy on 5/31- non-small cell [revived at tumor conference at [facility]  # June 9th 2022- CARBO-TAXOl-KEYTRUDA; Fulphila  MOLECULAR TESTING: NGS TPS-PDL 100%; EXON 12 amplification* mutations.    # CAD [CABG 2016; Dr.Gollan]   Cancer of lower lobe of left lung ([place])  11/02/2020 Initial Diagnosis   Cancer of lower lobe of left lung ([place])   11/02/2020 Cancer Staging   Staging form: Lung, AJCC 8th Edition - Clinical: Stage IVA (cT3, cN1, cM1a) - Signed by Cammie Sickle, MD on 11/02/2020    11/16/2020 -  Chemotherapy    Patient is on Treatment Plan: LUNG NSCLC CARBOPLATIN + PACLITAXEL + PEMBROLIZUMAB Q21D X 4 CYCLES / PEMBROLIZUMAB MAINTENANCE Q21D          HISTORY OF PRESENTING ILLNESS:  Randall Ali. 80 y.o.  male lung  cancer-non-small cell stage IV [supra-renal nodule s/p biopsy] is here to review MRI/proceed with chemotherapy-immunotherapy.   Patient denies any worsening shortness of breath or cough.  Denies any chest pain.  Denies any headaches.  No nausea no vomiting.  He continues to be physically active.   Review of Systems  Constitutional:  Positive for malaise/fatigue. Negative for chills, diaphoresis, fever and weight loss.  HENT:  Negative for nosebleeds and sore throat.   Eyes:  Negative for double vision.  Respiratory:  Negative for cough, hemoptysis, sputum production, shortness of breath and wheezing.   Cardiovascular:  Negative for chest pain, palpitations, orthopnea and leg swelling.  Gastrointestinal:  Negative for abdominal pain, blood in stool, constipation, diarrhea, heartburn, melena, nausea and vomiting.  Genitourinary:  Negative for dysuria, frequency and urgency.  Musculoskeletal:  Positive for back pain and joint pain.  Skin: Negative.  Negative for itching and rash.  Neurological:  Negative for dizziness, tingling, focal weakness, weakness and headaches.  Endo/Heme/Allergies:  Does not bruise/bleed easily.  Psychiatric/Behavioral:  Negative for depression. The patient is not nervous/anxious and does not have insomnia.     MEDICAL HISTORY:  Past Medical History:  Diagnosis Date   Allergy    seasonal   Arthritis    all over- in general    CAD (coronary artery disease)    a. inferior wall MI 10/01 s/p PCI/DES to RCA; b. Myoview 3/16 neg for ischemia; c. LHC 8/16: ostLAD 80%, OM1 70%, OM2 70% x 2 lesions, mRCA 30%, dRCA 70% s/p 4-V CABG 01/24/15 (LIMA-LAD, VG- OM1, VG-OM2, VG-PDA)    Cancer ([place])  skin, melanoma   Carotid artery disease ([place])    a. [place] 8/16: 1-39% bilateral ICA stenosis   Cataract    removed   Diastolic dysfunction    a. TTE 8/16: EF 55-60%, no RWMA, Gr1DD, calcified mitral annulus, mild biatrial enlargement   Erectile dysfunction    GERD (gastroesophageal  reflux disease)    History of elbow surgery    History of hiatal hernia    HLD (hyperlipidemia)    HTN (hypertension)    Inferior myocardial infarction ([place]) 10/01   stent RCA   Postoperative wound infection 02/02/2015   Reflux esophagitis    Sleep apnea 2017   CPAP at night    SURGICAL HISTORY: Past Surgical History:  Procedure Laterality Date   arm surgery  2010   BROW LIFT Bilateral 11/25/2019   Procedure: BROW PTOSIS REPAIR BILATERAL;  Surgeon: Karle Starch, MD;  Location: [facility];  Service: Ophthalmology;  Laterality: Bilateral;  sleep apnea   CARDIAC CATHETERIZATION  06/24/11   CARDIAC CATHETERIZATION N/A 01/18/2015   Procedure: Left Heart Cath with coronary angiography;  Surgeon: Minna Merritts, MD;  Location: [facility];  Service: Cardiovascular;  Laterality: N/A;   CARDIAC CATHETERIZATION N/A 01/18/2015   Procedure: Intravascular Pressure Wire/FFR Study;  Surgeon: [name], MD;  Location:  [facility];  Service: Cardiovascular;  Laterality: N/A;   CAROTID STENT  03/10/2011   COLONOSCOPY  2010   COLONOSCOPY  06/14/2014   Dr Hilarie Fredrickson   CORONARY ARTERY BYPASS GRAFT N/A 01/24/2015   Procedure: CORONARY ARTERY BYPASS GRAFTING x 4 (LIMA-LAD, SVG-Int 1- Int 2, SVG-PD) ENDOSCOPIC GREATER SAPHENOUS VEIN HARVEST LEFT LEG;  Surgeon: [name], MD;  Location: [facility];  Service: Open Heart Surgery;  Laterality: N/A;   EMBOLECTOMY  06/15/2019   Procedure: EMBOLECTOMY;  Surgeon: Katha Cabal, MD;  Location: [facility];  Service: Vascular;;  right superficial femoral artery   ENDARTERECTOMY FEMORAL Right 06/15/2019   Procedure: ENDARTERECTOMY FEMORAL;  Surgeon: Katha Cabal, MD;  Location: [facility];  Service: Vascular;  Laterality: Right;  common femoral profunda femoris superficial femoral   ESOPHAGOGASTRODUODENOSCOPY (EGD) WITH PROPOFOL N/A 04/24/2016   Procedure: ESOPHAGOGASTRODUODENOSCOPY (EGD) WITH PROPOFOL;  Surgeon: Jerene Bears, MD;   [facility];  Service: Gastroenterology;  Laterality: N/A;   EYE SURGERY     lasik 15 yrs. ago, cataracts removed - both eyes    HAMMER TOE SURGERY     right toe   INSERTION OF ILIAC STENT Right 06/15/2019   Procedure: INSERTION OF ILIAC STENT ( STENTING OF SFA/POP ARTERY );  Surgeon: Katha Cabal, MD;  Location: [facility];  Service: Vascular;  Laterality: Right;  angioplpasty and stent placement: right superficial femoral right tibiopopliteal trunk bilateral common iliac arteries   IR IMAGING GUIDED PORT INSERTION  11/09/2020   LEFT HEART CATH AND CORONARY ANGIOGRAPHY Left 06/10/2017   Procedure: LEFT HEART CATH AND CORONARY ANGIOGRAPHY;  Surgeon: Minna Merritts, MD;  Location: [facility];  Service: Cardiovascular;  Laterality: Left;   LOWER EXTREMITY ANGIOGRAPHY Left 01/04/2019   Procedure: LOWER EXTREMITY ANGIOGRAPHY;  Surgeon: Katha Cabal, MD;  Location: [facility];  Service: Cardiovascular;  Laterality: Left;   LOWER EXTREMITY ANGIOGRAPHY Right 01/25/2019   Procedure: LOWER EXTREMITY ANGIOGRAPHY;  Surgeon: Katha Cabal, MD;  Location: [facility];  Service: Cardiovascular;  Laterality: Right;   NASAL SINUS SURGERY  2008   septpolasty, bilateral turbinate reduction  SHOULDER ARTHROSCOPY  2012   TEE WITHOUT CARDIOVERSION N/A 01/24/2015   Procedure: TRANSESOPHAGEAL ECHOCARDIOGRAM (TEE);  Surgeon: [name], MD;  Location: [facility];  Service: Open Heart Surgery;  Laterality: N/A;   TOE SURGERY  1994   UPPER GI ENDOSCOPY  07/2014, 04-24-16   Dr Raquel James   WRIST SURGERY  2011    SOCIAL HISTORY: Social History   Socioeconomic History   Marital status: Single    Spouse name: Not on file   Number of children: Not on file   Years of education: 12   Highest education level: Not on file  Occupational History   Occupation: retired    Comment: [employer]  Tobacco Use   Smoking status: Former    Packs/day: 2.50    Years: 40.00     Pack years: 100.00    Types: Cigarettes    Quit date: 03/24/2000    Years since quitting: 20.7   Smokeless tobacco: Never  Vaping Use   Vaping Use: Never used  Substance and Sexual Activity   Alcohol use: Yes    Comment: occ   Drug use: No   Sexual activity: Yes  Other Topics Concern   Not on file  Social History Narrative   Singled; lives with son and dog    Occ: retired, back part time at [employer];    Activity: gym 4-5d/wk   Diet: good water, fruits/vegetables daily   Caffeine Use-yes      ------------------------------------       Car sales- retd; [employer]- retd; quit smoking 2001. Alcohol couple nights a week. Live in [place]. Daughter lives 10 mins.    Social Determinants of Health   Financial Resource Strain: Low Risk    Difficulty of Paying Living Expenses: Not hard at all  Food Insecurity: No Food Insecurity   Worried About Charity fundraiser in the Last Year: Never true   [place] in the Last Year: Never true  Transportation Needs: No Transportation Needs   Lack of Transportation (Medical): No   Lack of Transportation (Non-Medical): No  Physical Activity: Sufficiently Active   Days of Exercise per Week: 7 days   Minutes of Exercise per Session: 60 min  Stress: No Stress Concern Present   Feeling of Stress : Not at all  Social Connections: Not on file  Intimate Partner Violence: Not At Risk   Fear of Current or Ex-Partner: No   Emotionally Abused: No   Physically Abused: No   Sexually Abused: No    FAMILY HISTORY: Family History  Problem Relation Age of Onset   Hypertension Mother    Heart disease Mother    Hypertension Father    Diabetes Father    Heart disease Brother 11   Cancer Paternal Grandfather    Lymphoma Sister    Colon cancer Neg Hx    Prostate cancer Neg Hx    Bladder Cancer Neg Hx    Kidney cancer Neg Hx     ALLERGIES:  is allergic to flomax [tamsulosin].  MEDICATIONS:  Current Outpatient Medications  Medication  Sig Dispense Refill   Ascorbic Acid (VITAMIN C) 1000 MG tablet Take 1,000 mg by mouth daily.     aspirin 81 MG EC tablet Take 81 mg by mouth daily.       Calcium-Magnesium-Vitamin D (CALCIUM 1200+D3 PO) Take 1 tablet by mouth daily.     Carboxymethylcellul-Glycerin (LUBRICATING EYE DROPS OP) Place 1 drop into both eyes daily as needed (  irritation).     Cholecalciferol (VITAMIN D3) 50 MCG (2000 UT) TABS Take 2,000 Units by mouth daily.     Cyanocobalamin (B-12) 5000 MCG CAPS Take 5,000 mcg by mouth daily.     docusate sodium (COLACE) 100 MG capsule Take 100 mg by mouth at bedtime.     lidocaine-prilocaine (EMLA) cream Apply 30 -45 mins prior to port access. 30 g 0   lisinopril (ZESTRIL) 5 MG tablet TAKE 1 TABLET BY MOUTH DAILY (Patient taking differently: Take 5 mg by mouth daily.) 90 tablet 3   Melatonin 10 MG CAPS Take 10 mg by mouth at bedtime as needed (sleep).     metoprolol succinate (TOPROL-XL) 25 MG 24 hr tablet TAKE 1/2 TABLET (12.5 MG) BY MOUTH DAILY (Patient taking differently: Take 12.5 mg by mouth daily.) 45 tablet 3   Niacinamide-Zn-Cu-Methfo-Se-Cr (NICOTINAMIDE PO) Take 500 mg by mouth daily.     NONFORMULARY OR COMPOUNDED ITEM Trimix (30/1/10)-(Pap/Phent/PGE)  Test Dose 3 50ml vials  Qty #3 Refills 0  [facility] [phone] Fax [phone] 3 each 0   pantoprazole (PROTONIX) 40 MG tablet Take 1 tablet (40 mg total) by mouth daily. (Patient taking differently: Take 40 mg by mouth at bedtime.) 90 tablet 3   Polyethyl Glycol-Propyl Glycol (LUBRICANT EYE DROPS) 0.4-0.3 % SOLN Place 1-2 drops into both eyes 3 (three) times daily as needed (burning eyes.).     simvastatin (ZOCOR) 40 MG tablet Take 1 tablet (40 mg total) by mouth at bedtime. 90 tablet 0   traMADol (ULTRAM) 50 MG tablet TAKE 2 TABLETS BY MOUTH DAILY AS NEEDED 60 tablet 0   Vitamin E 450 MG (1000 UT) CAPS Take 1,000 Units by mouth daily.     Zinc 50 MG TABS Take 50 mg by mouth daily.     ibuprofen (ADVIL)  200 MG tablet Take 400-600 mg by mouth every 6 (six) hours as needed for moderate pain. (Patient not taking: No sig reported)     nitroGLYCERIN (NITROSTAT) 0.4 MG SL tablet Place 1 tablet (0.4 mg total) under the tongue every 5 (five) minutes as needed for chest pain. (Patient not taking: No sig reported) 25 tablet 3   ondansetron (ZOFRAN) 8 MG tablet One pill every 8 hours as needed for nausea/vomitting. (Patient not taking: Reported on 12/06/2020) 40 tablet 1   prochlorperazine (COMPAZINE) 10 MG tablet Take 1 tablet (10 mg total) by mouth every 6 (six) hours as needed for nausea or vomiting. (Patient not taking: No sig reported) 40 tablet 1   No current facility-administered medications for this visit.      [name]  PHYSICAL EXAMINATION: ECOG PERFORMANCE STATUS: 0 - Asymptomatic  Vitals:   12/06/20 0830  BP: 136/74  Resp: 18  Temp: 97.6 F (36.4 C)  SpO2: 95%    There were no vitals filed for this visit.   Physical Exam Constitutional:      Comments: Ambulatory independently; accompanied by his daughter  HENT:     Head: Normocephalic and atraumatic.     Mouth/Throat:     Pharynx: No oropharyngeal exudate.  Eyes:     Pupils: Pupils are equal, round, and reactive to light.  Cardiovascular:     Rate and Rhythm: Normal rate and regular rhythm.  Pulmonary:     Effort: No respiratory distress.     Breath sounds: No wheezing.  Abdominal:     General: Bowel sounds are normal. There is no distension.     Palpations: Abdomen is soft. There  is no mass.     Tenderness: no abdominal tenderness There is no guarding or rebound.  Musculoskeletal:        General: No tenderness. Normal range of motion.     Cervical back: Normal range of motion and neck supple.  Skin:    General: Skin is warm.  Neurological:     Mental Status: He is alert and oriented to person, place, and time.  Psychiatric:        Mood and Affect: Affect normal.     LABORATORY DATA:  I have reviewed the data as  listed Lab Results  Component Value Date   WBC 10.2 12/06/2020   HGB 14.0 12/06/2020   HCT 41.7 12/06/2020   MCV 90.8 12/06/2020   PLT 282 12/06/2020   Recent Labs    10/12/20 1019 11/15/20 1232 11/26/20 1107 12/06/20 0754  NA 137 135 135 137  K 4.4 4.3 4.3 4.3  CL 104 102 100 104  CO2 22 23 25 26   GLUCOSE 101* 100* 100* 103*  BUN 23 24* 15 19  CREATININE 0.84 0.90 0.90 0.96  CALCIUM 9.1 8.9 9.2 9.5  GFRNONAA >60 >60 >60 >60  PROT 7.0 7.1  --  7.3  ALBUMIN 4.0 3.9  --  4.0  AST 16 12*  --  16  ALT 16 13  --  17  ALKPHOS 56 60  --  72  BILITOT 0.7 0.3  --  0.8    RADIOGRAPHIC STUDIES: I have personally reviewed the radiological images as listed and agreed with the findings in the report. MR Abdomen W Wo Contrast  Result Date: 11/19/2020 CLINICAL DATA:  History of lung cancer with metastatic nodule adjacent to the superior pole of the right kidney, previously biopsied EXAM: MRI ABDOMEN WITHOUT AND WITH CONTRAST TECHNIQUE: Multiplanar multisequence MR imaging of the abdomen was performed both before and after the administration of intravenous contrast. CONTRAST:  51mL GADAVIST GADOBUTROL 1 MMOL/ML IV SOLN COMPARISON:  CT guided biopsy, 11/06/2020, PET-CT, 10/17/2020 FINDINGS: Lower chest: No acute findings. Hepatobiliary: Multiple fluid signal, nonenhancing lesions of the liver, consistent with small cysts. No mass or other parenchymal abnormality identified. No biliary ductal dilatation. Pancreas: No mass, inflammatory changes, or other parenchymal abnormality identified. Spleen:  Within normal limits in size and appearance. Adrenals/Urinary Tract: Redemonstrated, rim enhancing mass adjacent to the superior pole of the right kidney, measuring 3.7 x 3.2 cm, significantly enlarged even in comparison to recent prior CT guided biopsy dated 11/06/2020, as well as prior PET-CT dated 10/17/2020. (Series 15, image 40, series 19, image 22). No evidence of hydronephrosis. Stomach/Bowel:  Visualized portions within the abdomen are unremarkable. Vascular/Lymphatic: No pathologically enlarged lymph nodes identified. No abdominal aortic aneurysm demonstrated. Aortic atherosclerosis. Other:  None. Musculoskeletal: No suspicious bone lesions identified. IMPRESSION: 1. Redemonstrated rim enhancing mass adjacent to the superior pole of the right kidney, measuring 3.7 x 3.2 cm, significantly enlarged even in comparison to recent prior CT guided biopsy dated 11/06/2020, as well as prior PET-CT dated 10/17/2020. Findings are consistent with aggressively enlarging lung cancer metastasis, biopsy proven. 2. No additional evidence of metastatic disease within the abdomen. Aortic Atherosclerosis (ICD10-I70.0). Electronically Signed   By: Eddie Candle M.D.   On: 11/19/2020 16:39   CT BIOPSY  Result Date: 11/06/2020 INDICATION: 80 year old gentleman with lung malignancy and enlarging right retroperitoneal soft tissue implant presents to interventional radiology for CT-guided biopsy. EXAM: CT-guided biopsy of right retroperitoneal implant MEDICATIONS: None. ANESTHESIA/SEDATION: Moderate (conscious) sedation was employed during  this procedure. A total of Versed 1.5 mg and Fentanyl 75 mcg was administered intravenously. Moderate Sedation Time: 22 minutes. [name] level of consciousness and vital signs were monitored continuously by radiology nursing throughout the procedure under my direct supervision. COMPLICATIONS: None immediate. PROCEDURE: Informed written consent was obtained from the patient after a thorough discussion of the procedural risks, benefits and alternatives. All questions were addressed. Maximal Sterile Barrier Technique was utilized including caps, mask, sterile gowns, sterile gloves, sterile drape, hand hygiene and skin antiseptic. A timeout was performed prior to the initiation of the procedure. Patient positioned supine on the CT table. The right lateral abdominal wall skin prepped and  draped in usual fashion. Following local lidocaine administration, 17 gauge introducer needle was advanced into the right retroperitoneal mass by transhepatic approach utilizing CT guidance. 4-18 gauge cores were obtained from the right retroperitoneal mass. All samples were sent to pathology in formalin. Postprocedure CT demonstrated no significant hemorrhage or pneumothorax. IMPRESSION: CT-guided biopsy of right retroperitoneal mass. Electronically Signed   By: Miachel Roux M.D.   On: 11/06/2020 13:48   IR IMAGING GUIDED PORT INSERTION  Result Date: 11/09/2020 INDICATION: 80 year old with left lung cancer.  Port needed for chemotherapy. EXAM: FLUOROSCOPIC AND ULTRASOUND GUIDED PLACEMENT OF A SUBCUTANEOUS PORT COMPARISON:  None. MEDICATIONS: Moderate sedation ANESTHESIA/SEDATION: Versed 2.0 mg IV; Fentanyl 100 mcg IV; Moderate Sedation Time:  40 minutes The patient was continuously monitored during the procedure by the interventional radiology nurse under my direct supervision. FLUOROSCOPY TIME:  18 seconds, 7.61 mGy COMPLICATIONS: None immediate. PROCEDURE: The procedure, risks, benefits, and alternatives were explained to the patient. Questions regarding the procedure were encouraged and answered. The patient understands and consents to the procedure. Patient was placed supine on the interventional table. Ultrasound confirmed a patent right internal jugular vein. Ultrasound image was saved for documentation. The right chest and neck were cleaned with a skin antiseptic and a sterile drape was placed. Maximal barrier sterile technique was utilized including caps, mask, sterile gowns, sterile gloves, sterile drape, hand hygiene and skin antiseptic. The right neck was anesthetized with 1% lidocaine with epinephrine. Small incision was made in the right neck with a blade. Micropuncture set was placed in the right internal jugular vein with ultrasound guidance. The micropuncture wire was used for measurement  purposes. The right chest was anesthetized with 1% lidocaine with epinephrine. #15 blade was used to make an incision and a subcutaneous port pocket was formed. [place] was assembled. Subcutaneous tunnel was formed with a stiff tunneling device. The port catheter was brought through the subcutaneous tunnel. The port was placed in the subcutaneous pocket. The micropuncture set was exchanged for a peel-away sheath. The catheter was placed through the peel-away sheath and the tip was positioned at the superior cavoatrial junction. Catheter placement was confirmed with fluoroscopy. The port was accessed and flushed with heparinized saline. The port pocket was closed using two layers of absorbable sutures and Dermabond. The vein skin site was closed using a single layer of absorbable suture and Dermabond. Sterile dressings were applied. Patient tolerated the procedure well without an immediate complication. Ultrasound and fluoroscopic images were taken and saved for this procedure. IMPRESSION: Placement of a subcutaneous port device. Catheter tip at the superior cavoatrial junction. Electronically Signed   By: Markus Daft M.D.   On: 11/09/2020 11:54     ASSESSMENT & PLAN:   Cancer of lower lobe of left lung ([facility]) #Lung cancer-non-small cell stage IV [right suprarenal  nodule-s/p biopsy on 5/31-see below]. Right suprarenal mass biopsy-reviewed at the tumor conference-suggestive of non-small cell carcinoma rather than clear-cell; also MRI abdomen no renal lesions noted.  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  Currently on CarboTaxol plus Keytruda  #With regards to treatment plan-proceed with [place] the extreme rapidity of growth of the right suprarenal nodule/mass] every 3 weeks x 4; followed by Keytruda maintenance.  We will plan imaging after 3 cycles.  #Given the rapidity of the growth of the right suprarenal mass-continue chemoimmunotherapy carbo Taxol plus Keytruda  cycle #2. Labs today reviewed;  acceptable for treatment today.   # CAD-s/p CABG/ PVD- .  No CHF; however given claudication pain reasonable to follow-up with vascular  #Prognosis/treatment plan-discussed that the median survival is anywhere between 1 to 2 years.  However patient specific survival is hard to predict at this time.  Subsequent scans/response to therapy/tolerance to therapy would help.  Given stage IV disease with treatments are palliative not curative.  Would not recommend surgery for the lung for stage IV cancer.  Discussed with Dr. Roxan Hockey   # DISPOSITION: #  chemo today # labs in 10 days- cbc/bmp # follow up in 3 weeks- MD; labs- cbc/cmp;  [carbo-taxol-keytruda]; d-2; ffuphila Dr.B  # I reviewed the blood work- with the patient in detail; also reviewed the imaging independently [as summarized above]; and with the patient in detail.   # 40 minutes face-to-face with the patient discussing the above plan of care; more than 50% of time spent on prognosis/ natural history; counseling and coordination.    All  questions were answered. The patient knows to call the clinic with any problems, questions or concerns.    Cammie Sickle, MD 12/06/2020 9:12 AM

## 2020-12-06 NOTE — Telephone Encounter (Signed)
Pt left v/m that pharmacy will be requesting refill on tramadol on 12/07/20 and pt wants to know if could increase qty back to # 90; pt said since taking CA treatments he is really hurting. Pt said had cut pt back to # 4 but he really needs more tramadol due to CA treatments.  Sending note to [name].

## 2020-12-06 NOTE — Patient Instructions (Signed)
[facility]  Discharge Instructions: Thank you for choosing [facility] to provide your [facility] and hematology care.  If you have a lab appointment with the [facility], please go directly to the [facility] and check in at the registration area.  Wear comfortable clothing and clothing appropriate for easy access to any Portacath or PICC line.   We strive to give you quality time with your provider. You may need to reschedule your appointment if you arrive late (15 or more minutes).  Arriving late affects you and other patients whose appointments are after yours.  Also, if you miss three or more appointments without notifying the office, you may be dismissed from the clinic at the provider's discretion.      For prescription refill requests, have your pharmacy contact our office and allow 72 hours for refills to be completed.    Today you received the following chemotherapy and/or immunotherapy agents - paclitaxel, carboplatin, pembrolizumab      To help prevent nausea and vomiting after your treatment, we encourage you to take your nausea medication as directed.  BELOW ARE SYMPTOMS THAT SHOULD BE REPORTED IMMEDIATELY: *FEVER GREATER THAN 100.4 F (38 C) OR HIGHER *CHILLS OR SWEATING *NAUSEA AND VOMITING THAT IS NOT CONTROLLED WITH YOUR NAUSEA MEDICATION *UNUSUAL SHORTNESS OF BREATH *UNUSUAL BRUISING OR BLEEDING *URINARY PROBLEMS (pain or burning when urinating, or frequent urination) *BOWEL PROBLEMS (unusual diarrhea, constipation, pain near the anus) TENDERNESS IN MOUTH AND THROAT WITH OR WITHOUT PRESENCE OF ULCERS (sore throat, sores in mouth, or a toothache) UNUSUAL RASH, SWELLING OR PAIN  UNUSUAL VAGINAL DISCHARGE OR ITCHING   Items with * indicate a potential emergency and should be followed up as soon as possible or go to the Emergency Department if any problems should occur.  Please show the CHEMOTHERAPY ALERT CARD or  IMMUNOTHERAPY ALERT CARD at check-in to the Emergency Department and triage nurse.  Should you have questions after your visit or need to cancel or reschedule your appointment, please contact [facility]  [phone] and follow the prompts.  Office hours are 8:00 a.m. to 4:30 p.m. Monday - Friday. Please note that voicemails left after 4:00 p.m. may not be returned until the following business day.  We are closed weekends and major holidays. You have access to a nurse at all times for urgent questions. Please call the main number to the clinic [phone] and follow the prompts.  For any non-urgent questions, you may also contact your provider using MyChart. We now offer e-Visits for anyone 77 and older to request care online for non-urgent symptoms. For details visit [email or web address].   Also download the MyChart app! Go to the app store, search "MyChart", open the app, select [facility], and log in with your MyChart username and password.  Due to Covid, a mask is required upon entering the hospital/clinic. If you do not have a mask, one will be given to you upon arrival. For doctor visits, patients may have 1 support person aged 72 or older with them. For treatment visits, patients cannot have anyone with them due to current Covid guidelines and our immunocompromised population.   Paclitaxel injection What is this medication? PACLITAXEL (PAK li TAX el) is a chemotherapy drug. It targets fast dividing cells, like cancer cells, and causes these cells to die. This medicine is used to treat ovarian cancer, breast cancer, lung cancer, Kaposi's sarcoma, andother cancers. This medicine may be used for other  purposes; ask your health care provider orpharmacist if you have questions. COMMON BRAND NAME(S): Onxol, Taxol What should I tell my care team before I take this medication? They need to know if you have any of these conditions: history of irregular  heartbeat liver disease low blood counts, like low white cell, platelet, or red cell counts lung or breathing disease, like asthma tingling of the fingers or toes, or other nerve disorder an unusual or allergic reaction to paclitaxel, alcohol, polyoxyethylated castor oil, other chemotherapy, other medicines, foods, dyes, or preservatives pregnant or trying to get pregnant breast-feeding How should I use this medication? This drug is given as an infusion into a vein. It is administered in a hospitalor clinic by a specially trained health care professional. Talk to your pediatrician regarding the use of this medicine in children.Special care may be needed. Overdosage: If you think you have taken too much of this medicine contact apoison control center or emergency room at once. NOTE: This medicine is only for you. Do not share this medicine with others. What if I miss a dose? It is important not to miss your dose. Call your doctor or health careprofessional if you are unable to keep an appointment. What may interact with this medication? Do not take this medicine with any of the following medications: live virus vaccines This medicine may also interact with the following medications: antiviral medicines for hepatitis, HIV or AIDS certain antibiotics like erythromycin and clarithromycin certain medicines for fungal infections like ketoconazole and itraconazole certain medicines for seizures like carbamazepine, phenobarbital, phenytoin gemfibrozil nefazodone rifampin St. John's wort This list may not describe all possible interactions. Give your health care provider a list of all the medicines, herbs, non-prescription drugs, or dietary supplements you use. Also tell them if you smoke, drink alcohol, or use illegaldrugs. Some items may interact with your medicine. What should I watch for while using this medication? Your condition will be monitored carefully while you are receiving this  medicine. You will need important blood work done while you are taking thismedicine. This medicine can cause serious allergic reactions. To reduce your risk you will need to take other medicine(s) before treatment with this medicine. If you experience allergic reactions like skin rash, itching or hives, swelling of theface, lips, or tongue, tell your doctor or health care professional right away. In some cases, you may be given additional medicines to help with side effects.Follow all directions for their use. This drug may make you feel generally unwell. This is not uncommon, as chemotherapy can affect healthy cells as well as cancer cells. Report any side effects. Continue your course of treatment even though you feel ill unless yourdoctor tells you to stop. Call your doctor or health care professional for advice if you get a fever, chills or sore throat, or other symptoms of a cold or flu. Do not treat yourself. This drug decreases your body's ability to fight infections. Try toavoid being around people who are sick. This medicine may increase your risk to bruise or bleed. Call your doctor orhealth care professional if you notice any unusual bleeding. Be careful brushing and flossing your teeth or using a toothpick because you may get an infection or bleed more easily. If you have any dental work done,tell your dentist you are receiving this medicine. Avoid taking products that contain aspirin, acetaminophen, ibuprofen, naproxen, or ketoprofen unless instructed by your doctor. These medicines may hide afever. Do not become pregnant while taking this medicine. Women should inform  their doctor if they wish to become pregnant or think they might be pregnant. There is a potential for serious side effects to an unborn child. Talk to your health care professional or pharmacist for more information. Do not breast-feed aninfant while taking this medicine. Men are advised not to father a child while receiving  this medicine. This product may contain alcohol. Ask your pharmacist or healthcare provider if this medicine contains alcohol. Be sure to tell all healthcare providers you are taking this medicine. Certain medicines, like metronidazole and disulfiram, can cause an unpleasant reaction when taken with alcohol. The reaction includes flushing, headache, nausea, vomiting, sweating, and increased thirst. Thereaction can last from 30 minutes to several hours. What side effects may I notice from receiving this medication? Side effects that you should report to your doctor or health care professionalas soon as possible: allergic reactions like skin rash, itching or hives, swelling of the face, lips, or tongue breathing problems changes in vision fast, irregular heartbeat high or low blood pressure mouth sores pain, tingling, numbness in the hands or feet signs of decreased platelets or bleeding - bruising, pinpoint red spots on the skin, black, tarry stools, blood in the urine signs of decreased red blood cells - unusually weak or tired, feeling faint or lightheaded, falls signs of infection - fever or chills, cough, sore throat, pain or difficulty passing urine signs and symptoms of liver injury like dark yellow or brown urine; general ill feeling or flu-like symptoms; light-colored stools; loss of appetite; nausea; right upper belly pain; unusually weak or tired; yellowing of the eyes or skin swelling of the ankles, feet, hands unusually slow heartbeat Side effects that usually do not require medical attention (report to yourdoctor or health care professional if they continue or are bothersome): diarrhea hair loss loss of appetite muscle or joint pain nausea, vomiting pain, redness, or irritation at site where injected tiredness This list may not describe all possible side effects. Call your doctor for medical advice about side effects. You may report side effects to FDA at1-800-FDA-1088. Where  should I keep my medication? This drug is given in a hospital or clinic and will not be stored at home. NOTE: This sheet is a summary. It may not cover all possible information. If you have questions about this medicine, talk to your doctor, pharmacist, orhealth care provider.  2022 Elsevier/Gold Standard (2019-04-27 13:37:23)  Carboplatin injection What is this medication? CARBOPLATIN (KAR boe pla tin) is a chemotherapy drug. It targets fast dividing cells, like cancer cells, and causes these cells to die. This medicine is usedto treat ovarian cancer and many other cancers. This medicine may be used for other purposes; ask your health care provider orpharmacist if you have questions. COMMON BRAND NAME(S): Paraplatin What should I tell my care team before I take this medication? They need to know if you have any of these conditions: blood disorders hearing problems kidney disease recent or ongoing radiation therapy an unusual or allergic reaction to carboplatin, cisplatin, other chemotherapy, other medicines, foods, dyes, or preservatives pregnant or trying to get pregnant breast-feeding How should I use this medication? This drug is usually given as an infusion into a vein. It is administered in [facility] or clinic by a specially trained health care professional. Talk to your pediatrician regarding the use of this medicine in children.Special care may be needed. Overdosage: If you think you have taken too much of this medicine contact apoison control center or emergency room at once. NOTE: This  medicine is only for you. Do not share this medicine with others. What if I miss a dose? It is important not to miss a dose. Call your doctor or health careprofessional if you are unable to keep an appointment. What may interact with this medication? medicines for seizures medicines to increase blood counts like filgrastim, pegfilgrastim, sargramostim some antibiotics like amikacin, gentamicin,  neomycin, streptomycin, tobramycin vaccines Talk to your doctor or health care professional before taking any of thesemedicines: acetaminophen aspirin ibuprofen ketoprofen naproxen This list may not describe all possible interactions. Give your health care provider a list of all the medicines, herbs, non-prescription drugs, or dietary supplements you use. Also tell them if you smoke, drink alcohol, or use illegaldrugs. Some items may interact with your medicine. What should I watch for while using this medication? Your condition will be monitored carefully while you are receiving this medicine. You will need important blood work done while you are taking thismedicine. This drug may make you feel generally unwell. This is not uncommon, as chemotherapy can affect healthy cells as well as cancer cells. Report any side effects. Continue your course of treatment even though you feel ill unless yourdoctor tells you to stop. In some cases, you may be given additional medicines to help with side effects.Follow all directions for their use. Call your doctor or health care professional for advice if you get a fever, chills or sore throat, or other symptoms of a cold or flu. Do not treat yourself. This drug decreases your body's ability to fight infections. Try toavoid being around people who are sick. This medicine may increase your risk to bruise or bleed. Call your doctor orhealth care professional if you notice any unusual bleeding. Be careful brushing and flossing your teeth or using a toothpick because you may get an infection or bleed more easily. If you have any dental work done,tell your dentist you are receiving this medicine. Avoid taking products that contain aspirin, acetaminophen, ibuprofen, naproxen, or ketoprofen unless instructed by your doctor. These medicines may hide afever. Do not become pregnant while taking this medicine. Women should inform their doctor if they wish to become pregnant  or think they might be pregnant. There is a potential for serious side effects to an unborn child. Talk to your health care professional or pharmacist for more information. Do not breast-feed aninfant while taking this medicine. What side effects may I notice from receiving this medication? Side effects that you should report to your doctor or health care professionalas soon as possible: allergic reactions like skin rash, itching or hives, swelling of the face, lips, or tongue signs of infection - fever or chills, cough, sore throat, pain or difficulty passing urine signs of decreased platelets or bleeding - bruising, pinpoint red spots on the skin, black, tarry stools, nosebleeds signs of decreased red blood cells - unusually weak or tired, fainting spells, lightheadedness breathing problems changes in hearing changes in vision chest pain high blood pressure low blood counts - This drug may decrease the number of white blood cells, red blood cells and platelets. You may be at increased risk for infections and bleeding. nausea and vomiting pain, swelling, redness or irritation at the injection site pain, tingling, numbness in the hands or feet problems with balance, talking, walking trouble passing urine or change in the amount of urine Side effects that usually do not require medical attention (report to yourdoctor or health care professional if they continue or are bothersome): hair loss loss of appetite  metallic taste in the mouth or changes in taste This list may not describe all possible side effects. Call your doctor for medical advice about side effects. You may report side effects to FDA at1-800-FDA-1088. Where should I keep my medication? This drug is given in a hospital or clinic and will not be stored at home. NOTE: This sheet is a summary. It may not cover all possible information. If you have questions about this medicine, talk to your doctor, pharmacist, orhealth care  provider.  2022 Elsevier/Gold Standard (2007-08-31 14:38:05)  Pembrolizumab injection What is this medication? PEMBROLIZUMAB (pem broe liz ue mab) is a monoclonal antibody. It is used totreat certain types of cancer. This medicine may be used for other purposes; ask your health care provider orpharmacist if you have questions. COMMON BRAND NAME(S): Keytruda What should I tell my care team before I take this medication? They need to know if you have any of these conditions: autoimmune diseases like Crohn's disease, ulcerative colitis, or lupus have had or planning to have an allogeneic stem cell transplant (uses someone else's stem cells) history of organ transplant history of chest radiation nervous system problems like myasthenia gravis or Guillain-Barre syndrome an unusual or allergic reaction to pembrolizumab, other medicines, foods, dyes, or preservatives pregnant or trying to get pregnant breast-feeding How should I use this medication? This medicine is for infusion into a vein. It is given by a health careprofessional in a hospital or clinic setting. A special MedGuide will be given to you before each treatment. Be sure to readthis information carefully each time. Talk to your pediatrician regarding the use of this medicine in children. While this drug may be prescribed for children as young as 6 months for selectedconditions, precautions do apply. Overdosage: If you think you have taken too much of this medicine contact apoison control center or emergency room at once. NOTE: This medicine is only for you. Do not share this medicine with others. What if I miss a dose? It is important not to miss your dose. Call your doctor or health careprofessional if you are unable to keep an appointment. What may interact with this medication? Interactions have not been studied. This list may not describe all possible interactions. Give your health care provider a list of all the medicines,  herbs, non-prescription drugs, or dietary supplements you use. Also tell them if you smoke, drink alcohol, or use illegaldrugs. Some items may interact with your medicine. What should I watch for while using this medication? Your condition will be monitored carefully while you are receiving thismedicine. You may need blood work done while you are taking this medicine. Do not become pregnant while taking this medicine or for 4 months after stopping it. Women should inform their doctor if they wish to become pregnant or think they might be pregnant. There is a potential for serious side effects to an unborn child. Talk to your health care professional or pharmacist for more information. Do not breast-feed an infant while taking this medicine orfor 4 months after the last dose. What side effects may I notice from receiving this medication? Side effects that you should report to your doctor or health care professionalas soon as possible: allergic reactions like skin rash, itching or hives, swelling of the face, lips, or tongue bloody or black, tarry breathing problems changes in vision chest pain chills confusion constipation cough diarrhea dizziness or feeling faint or lightheaded fast or irregular heartbeat fever flushing joint pain low blood counts - this medicine  may decrease the number of white blood cells, red blood cells and platelets. You may be at increased risk for infections and bleeding. muscle pain muscle weakness pain, tingling, numbness in the hands or feet persistent headache redness, blistering, peeling or loosening of the skin, including inside the mouth signs and symptoms of high blood sugar such as dizziness; dry mouth; dry skin; fruity breath; nausea; stomach pain; increased hunger or thirst; increased urination signs and symptoms of kidney injury like trouble passing urine or change in the amount of urine signs and symptoms of liver injury like dark urine,  light-colored stools, loss of appetite, nausea, right upper belly pain, yellowing of the eyes or skin sweating swollen lymph nodes weight loss Side effects that usually do not require medical attention (report to yourdoctor or health care professional if they continue or are bothersome): decreased appetite hair loss tiredness This list may not describe all possible side effects. Call your doctor for medical advice about side effects. You may report side effects to FDA at1-800-FDA-1088. Where should I keep my medication? This drug is given in a hospital or clinic and will not be stored at home. NOTE: This sheet is a summary. It may not cover all possible information. If you have questions about this medicine, talk to your doctor, pharmacist, orhealth care provider.  2022 Elsevier/Gold Standard (2019-04-27 21:44:53)

## 2020-12-06 NOTE — Assessment & Plan Note (Addendum)
#  Lung cancer-non-small cell stage IV [right suprarenal nodule-s/p biopsy on 5/31-see below]. Right suprarenal mass biopsy-reviewed at the tumor conference-suggestive of non-small cell carcinoma rather than clear-cell; also MRI abdomen no renal lesions noted.  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  Currently on CarboTaxol plus Keytruda  #With regards to treatment plan-proceed with [facility] the extreme rapidity of growth of the right suprarenal nodule/mass] every 3 weeks x 4; followed by Keytruda maintenance.  We will plan imaging after 3 cycles.  #Given the rapidity of the growth of the right suprarenal mass-continue chemoimmunotherapy carbo Taxol plus Keytruda cycle #2. Labs today reviewed;  acceptable for treatment today.   # CAD-s/p CABG/ PVD- .  No CHF; however given claudication pain reasonable to follow-up with vascular  #Prognosis/treatment plan-discussed that the median survival is anywhere between 1 to 2 years.  However patient specific survival is hard to predict at this time.  Subsequent scans/response to therapy/tolerance to therapy would help.  Given stage IV disease with treatments are palliative not curative.  Would not recommend surgery for the lung for stage IV cancer.  Discussed with Dr. Roxan Hockey   # DISPOSITION: #  chemo today # labs in 10 days- cbc/bmp # follow up in 3 weeks- MD; labs- cbc/cmp;  [carbo-taxol-keytruda]; d-2; ffuphila Dr.B  # I reviewed the blood work- with the patient in detail; also reviewed the imaging independently [as summarized above]; and with the patient in detail.   # 40 minutes face-to-face with the patient discussing the above plan of care; more than 50% of time spent on prognosis/ natural history; counseling and coordination.

## 2020-12-07 ENCOUNTER — Other Ambulatory Visit: Payer: Self-pay | Admitting: Cardiovascular Disease

## 2020-12-07 ENCOUNTER — Other Ambulatory Visit: Payer: Self-pay | Admitting: Family Medicine

## 2020-12-07 ENCOUNTER — Inpatient Hospital Stay: Payer: PPO | Attending: Internal Medicine

## 2020-12-07 DIAGNOSIS — I252 Old myocardial infarction: Secondary | ICD-10-CM | POA: Insufficient documentation

## 2020-12-07 DIAGNOSIS — C3432 Malignant neoplasm of lower lobe, left bronchus or lung: Secondary | ICD-10-CM | POA: Insufficient documentation

## 2020-12-07 DIAGNOSIS — Z7982 Long term (current) use of aspirin: Secondary | ICD-10-CM | POA: Insufficient documentation

## 2020-12-07 DIAGNOSIS — Z5112 Encounter for antineoplastic immunotherapy: Secondary | ICD-10-CM | POA: Diagnosis not present

## 2020-12-07 DIAGNOSIS — Z5189 Encounter for other specified aftercare: Secondary | ICD-10-CM | POA: Diagnosis not present

## 2020-12-07 DIAGNOSIS — Z87891 Personal history of nicotine dependence: Secondary | ICD-10-CM | POA: Insufficient documentation

## 2020-12-07 DIAGNOSIS — I10 Essential (primary) hypertension: Secondary | ICD-10-CM | POA: Insufficient documentation

## 2020-12-07 DIAGNOSIS — Z5111 Encounter for antineoplastic chemotherapy: Secondary | ICD-10-CM | POA: Insufficient documentation

## 2020-12-07 DIAGNOSIS — Z85820 Personal history of malignant melanoma of skin: Secondary | ICD-10-CM | POA: Insufficient documentation

## 2020-12-07 DIAGNOSIS — Z79899 Other long term (current) drug therapy: Secondary | ICD-10-CM | POA: Insufficient documentation

## 2020-12-07 DIAGNOSIS — N2889 Other specified disorders of kidney and ureter: Secondary | ICD-10-CM | POA: Insufficient documentation

## 2020-12-07 MED ORDER — PEGFILGRASTIM-JMDB 6 MG/0.6ML ~~LOC~~ SOSY
6.0000 mg | PREFILLED_SYRINGE | Freq: Once | SUBCUTANEOUS | Status: AC
Start: 2020-12-07 — End: 2020-12-07
  Administered 2020-12-07: 6 mg via SUBCUTANEOUS
  Filled 2020-12-07: qty 0.6

## 2020-12-07 NOTE — Telephone Encounter (Signed)
Please schedule overdue F/U appointment. Thank you! ?

## 2020-12-07 NOTE — Telephone Encounter (Signed)
ERx higher dose undergoing cancer treatment

## 2020-12-07 NOTE — Telephone Encounter (Signed)
Ok to do - I've sent in #90. I spoke with patient and discussed.

## 2020-12-07 NOTE — Telephone Encounter (Signed)
Name of Medication: Tramadol Name of Pharmacy: [facility] or Written Date and Quantity: 11/08/20, #60 Last Office Visit and Type: 10/02/20, 6 mo f/u Next Office Visit and Type: 04/03/21, AWV prt 2 Last Controlled Substance Agreement Date: none Last UDS: 01/18/16

## 2020-12-09 DIAGNOSIS — C786 Secondary malignant neoplasm of retroperitoneum and peritoneum: Secondary | ICD-10-CM | POA: Diagnosis not present

## 2020-12-11 ENCOUNTER — Ambulatory Visit: Payer: PPO | Admitting: Medical

## 2020-12-11 ENCOUNTER — Other Ambulatory Visit: Payer: Self-pay

## 2020-12-11 ENCOUNTER — Encounter: Payer: Self-pay | Admitting: Medical

## 2020-12-11 VITALS — BP 110/56 | HR 83 | Ht 68.0 in | Wt 206.0 lb

## 2020-12-11 DIAGNOSIS — I25110 Atherosclerotic heart disease of native coronary artery with unstable angina pectoris: Secondary | ICD-10-CM | POA: Diagnosis not present

## 2020-12-11 DIAGNOSIS — Z951 Presence of aortocoronary bypass graft: Secondary | ICD-10-CM | POA: Diagnosis not present

## 2020-12-11 DIAGNOSIS — I739 Peripheral vascular disease, unspecified: Secondary | ICD-10-CM

## 2020-12-11 DIAGNOSIS — I480 Paroxysmal atrial fibrillation: Secondary | ICD-10-CM | POA: Diagnosis not present

## 2020-12-11 DIAGNOSIS — C3492 Malignant neoplasm of unspecified part of left bronchus or lung: Secondary | ICD-10-CM | POA: Diagnosis not present

## 2020-12-11 DIAGNOSIS — E785 Hyperlipidemia, unspecified: Secondary | ICD-10-CM | POA: Diagnosis not present

## 2020-12-11 DIAGNOSIS — I10 Essential (primary) hypertension: Secondary | ICD-10-CM | POA: Diagnosis not present

## 2020-12-11 DIAGNOSIS — I9789 Other postprocedural complications and disorders of the circulatory system, not elsewhere classified: Secondary | ICD-10-CM

## 2020-12-11 DIAGNOSIS — I4891 Unspecified atrial fibrillation: Secondary | ICD-10-CM | POA: Diagnosis not present

## 2020-12-11 NOTE — Patient Instructions (Signed)
Medication Instructions:  Your physician recommends that you continue on your current medications as directed. Please refer to the Current Medication list given to you today.  *If you need a refill on your cardiac medications before your next appointment, please call your pharmacy*   Lab Work: None ordered today but we will get labs at your next office visit, so please come fasting.  If you have labs (blood work) drawn today and your tests are completely normal, you will receive your results only by: [place] (if you have MyChart) OR A paper copy in the mail If you have any lab test that is abnormal or we need to change your treatment, we will call you to review the results.   Testing/Procedures: None ordered   Follow-Up: At [name], you and your health needs are our priority.  As part of our continuing mission to provide you with exceptional heart care, we have created designated Provider Care Teams.  These Care Teams include your primary Cardiologist (physician) and Advanced Practice Providers (APPs -  Physician Assistants and Nurse Practitioners) who all work together to provide you with the care you need, when you need it.  We recommend signing up for the patient portal called "MyChart".  Sign up information is provided on this After Visit Summary.  MyChart is used to connect with patients for Virtual Visits (Telemedicine).  Patients are able to view lab/test results, encounter notes, upcoming appointments, etc.  Non-urgent messages can be sent to your provider as well.   To learn more about what you can do with MyChart, go to [email or web address].    Your next appointment:   4 month(s)  The format for your next appointment:   In Person  Provider:   You may see [name], MD or one of the following Advanced Practice Providers on your designated Care Team:   Murray Hodgkins, NP Christell Faith, PA-C Marrianne Mood, PA-C Cadence [place], [place] Laurann Montana,  NP   Other Instructions

## 2020-12-11 NOTE — Progress Notes (Signed)
Cardiology Office Note:    Date:  12/11/2020   ID:  Randall Ali., DOB March 06, 1941, MRN [id number]  PCP:  Ria Bush, MD  [facility] Cardiologist:  [name], MD  [facility] Electrophysiologist:  None   Referring MD: Ria Bush, MD   Chief Complaint: 1 year Follow-up  History of Present Illness:    Randall Ali. is a 80 y.o. male with a hx of CAD with inferior MI in 2001 s/p PCI/DES to the RCA at that time s/p CABGx4 on 01/2015 with post-op afib, carotid artery disease, extensive PAD, prior smoker (quit 12-13 years ago), HTN, HLD, GERD with EGD in 2016 showing severe esophagitis, chronic back pain, ED, obesity, OSA on CPAP, and recently diagnosed Non-small-cell lung cancer on chemotherapy who presents for follow-up.   Admitted in 2011 for chest pain and diaphoresis. Stress test showed no ischemia with scar. Repeat admission in 2013 with worsening chest pain with LHC showing moderate OM disease, otherwise no significant stenosis. Recurrent chest pain in spring of 2016 with nuclear stress test negative for ischemia, EF 54%, low risk study. He continued to have chest pain leading to left heart cath in August 2016 that showed severe three-vessel CAD.  Given multiple vessel CAD, he underwent four-vessel CABG on 01/24/2015 (LIMA to LAD, SVG to OM1, SVG to OM2, SVG to RPDA).  Postop course was complicated by A. fib, delirium, wound infection from sternal wound with cultures growing Enterobacter aerogenes.  He was seen in late 2018 with chest pain and underwent diagnostic cath 06/2017 showing severe three-vessel CAD including an occluded SVG to RCA.  The patient had a patent LIMA to LAD as well as patent SVG to OM1 and OM 2.  Medical management was recommended with deferment of intervention being reserved for refractory angina.  Echo 06/18/2017 showed EF 55 to 60%, normal wall motion, grade 1 diastolic dysfunction, mild MR, mildly dilated left atrium.  He was seen in the  office 06/28/2018 and denied any cardiac symptoms. Given his significant atherosclerotic disease initiation of Xarelto 2.5 mg was discussed with the patient, which was declined. Patient contacted the office in 10/27/2018 after getting hearing aids, with report of "hearing his heartbeat". Labs were unrevealing.  EKG shows sinus rhythm with rare PVC.  Patient has a long history of PAD with prior stenting followed by vascular surgery. In 12/2018 he underwent PCI with stent placement in the right common iliac and left common iliac. He also had angioplasty to bilateral external iliac arteries. ABIs 08/2020 showed mild right lower arterial disease and mild left lower arterial disease with right ABI 0.81 nad left ABO 0.93.   Patient last seen 08/16/2019 and denied any shortness of breath or chest pain.  Today, the patient has no major concerns. Patient denies chest pain, shortness of breath, palpitations, lower leg edema, orthopnea, pnd, dizziness, and lightheadedness. Patient goes to the gym every day, no symptoms with activity. In the last 3 months he has been diagnosed with lung cancer and has a spot above kidneys. He is doing chemotherapy, has done 2 treatments so far. Feels this has decreased his energy. EKG shows SR today. Discussed anticoagulation again, patient still not ready to start it. He said he also spoke with oncologist regarding anticoagulation, and it was felt it was not needed. Has been having claudication symptoms for many years. Has 3 stents. He is going to call to schedule with [facility].   Past Medical History:  Diagnosis Date   Allergy  seasonal   Arthritis    all over- in general    CAD (coronary artery disease)    a. inferior wall MI 10/01 s/p PCI/DES to RCA; b. Myoview 3/16 neg for ischemia; c. LHC 8/16: ostLAD 80%, OM1 70%, OM2 70% x 2 lesions, mRCA 30%, dRCA 70% s/p 4-V CABG 01/24/15 (LIMA-LAD, VG- OM1, VG-OM2, VG-PDA)    Cancer (HCC)    skin, melanoma   Carotid artery disease ([place])     a. [place] 8/16: 1-39% bilateral ICA stenosis   Cataract    removed   Diastolic dysfunction    a. TTE 8/16: EF 55-60%, no RWMA, Gr1DD, calcified mitral annulus, mild biatrial enlargement   Erectile dysfunction    GERD (gastroesophageal reflux disease)    History of elbow surgery    History of hiatal hernia    HLD (hyperlipidemia)    HTN (hypertension)    Inferior myocardial infarction ([place]) 03/2000   stent RCA   Lung cancer ([place])    Postoperative wound infection 02/02/2015   Reflux esophagitis    Sleep apnea 2017   CPAP at night    Past Surgical History:  Procedure Laterality Date   arm surgery  2010   BROW LIFT Bilateral 11/25/2019   Procedure: BROW PTOSIS REPAIR BILATERAL;  Surgeon: Karle Starch, MD;  Location: [place];  Service: Ophthalmology;  Laterality: Bilateral;  sleep apnea   CARDIAC CATHETERIZATION  06/24/11   CARDIAC CATHETERIZATION N/A 01/18/2015   Procedure: Left Heart Cath with coronary angiography;  Surgeon: Minna Merritts, MD;  Location: [place] [facility];  Service: Cardiovascular;  Laterality: N/A;   CARDIAC CATHETERIZATION N/A 01/18/2015   Procedure: Intravascular Pressure Wire/FFR Study;  Surgeon: [name], MD;  Location: [place] [facility];  Service: Cardiovascular;  Laterality: N/A;   CAROTID STENT  03/10/2011   COLONOSCOPY  2010   COLONOSCOPY  06/14/2014   Dr Hilarie Fredrickson   CORONARY ARTERY BYPASS GRAFT N/A 01/24/2015   Procedure: CORONARY ARTERY BYPASS GRAFTING x 4 (LIMA-LAD, SVG-Int 1- Int 2, SVG-PD) ENDOSCOPIC GREATER SAPHENOUS VEIN HARVEST LEFT LEG;  Surgeon: [name], MD;  Location: [place];  Service: Open Heart Surgery;  Laterality: N/A;   EMBOLECTOMY  06/15/2019   Procedure: EMBOLECTOMY;  Surgeon: Katha Cabal, MD;  Location: [facility];  Service: Vascular;;  right superficial femoral artery   ENDARTERECTOMY FEMORAL Right 06/15/2019   Procedure: ENDARTERECTOMY FEMORAL;  Surgeon: Katha Cabal, MD;  Location: [facility];   Service: Vascular;  Laterality: Right;  common femoral profunda femoris superficial femoral   ESOPHAGOGASTRODUODENOSCOPY (EGD) WITH PROPOFOL N/A 04/24/2016   Procedure: ESOPHAGOGASTRODUODENOSCOPY (EGD) WITH PROPOFOL;  Surgeon: Jerene Bears, MD;  Location: [facility];  Service: Gastroenterology;  Laterality: N/A;   EYE SURGERY     lasik 15 yrs. ago, cataracts removed - both eyes    HAMMER TOE SURGERY     right toe   INSERTION OF ILIAC STENT Right 06/15/2019   Procedure: INSERTION OF ILIAC STENT ( STENTING OF SFA/POP ARTERY );  Surgeon: Katha Cabal, MD;  Location: [facility];  Service: Vascular;  Laterality: Right;  angioplpasty and stent placement: right superficial femoral right tibiopopliteal trunk bilateral common iliac arteries   IR IMAGING GUIDED PORT INSERTION  11/09/2020   LEFT HEART CATH AND CORONARY ANGIOGRAPHY Left 06/10/2017   Procedure: LEFT HEART CATH AND CORONARY ANGIOGRAPHY;  Surgeon: Minna Merritts, MD;  Location: [place] [facility];  Service: Cardiovascular;  Laterality: Left;   LOWER  EXTREMITY ANGIOGRAPHY Left 01/04/2019   Procedure: LOWER EXTREMITY ANGIOGRAPHY;  Surgeon: Katha Cabal, MD;  Location: [place] [facility];  Service: Cardiovascular;  Laterality: Left;   LOWER EXTREMITY ANGIOGRAPHY Right 01/25/2019   Procedure: LOWER EXTREMITY ANGIOGRAPHY;  Surgeon: Katha Cabal, MD;  Location: [place] [facility];  Service: Cardiovascular;  Laterality: Right;   NASAL SINUS SURGERY  2008   septpolasty, bilateral turbinate reduction   SHOULDER ARTHROSCOPY  2012   TEE WITHOUT CARDIOVERSION N/A 01/24/2015   Procedure: TRANSESOPHAGEAL ECHOCARDIOGRAM (TEE);  Surgeon: [name], MD;  Location: [place];  Service: Open Heart Surgery;  Laterality: N/A;   TOE SURGERY  1994   UPPER GI ENDOSCOPY  07/2014, 04-24-16   Dr Raquel James   WRIST SURGERY  2011    Current Medications: Current Meds  Medication Sig   acetaminophen (TYLENOL) 325 MG tablet Take by mouth as  needed.   Ascorbic Acid (VITAMIN C) 1000 MG tablet Take 1,000 mg by mouth daily.   aspirin 81 MG EC tablet Take 81 mg by mouth daily.     Calcium-Magnesium-Vitamin D (CALCIUM 1200+D3 PO) Take 1 tablet by mouth daily.   Carboxymethylcellul-Glycerin (LUBRICATING EYE DROPS OP) Place 1 drop into both eyes daily as needed (irritation).   Cholecalciferol (VITAMIN D3) 50 MCG (2000 UT) TABS Take 2,000 Units by mouth daily.   Cyanocobalamin (B-12) 5000 MCG CAPS Take 5,000 mcg by mouth daily.   docusate sodium (COLACE) 100 MG capsule Take 100 mg by mouth at bedtime.   lidocaine-prilocaine (EMLA) cream Apply 30 -45 mins prior to port access.   lisinopril (ZESTRIL) 5 MG tablet TAKE 1 TABLET BY MOUTH DAILY   Melatonin 10 MG CAPS Take 10 mg by mouth at bedtime as needed (sleep).   metoprolol succinate (TOPROL-XL) 25 MG 24 hr tablet TAKE 1/2 TABLET (12.5 MG) BY MOUTH DAILY   Niacinamide-Zn-Cu-Methfo-Se-Cr (NICOTINAMIDE PO) Take 500 mg by mouth daily.   nitroGLYCERIN (NITROSTAT) 0.4 MG SL tablet Place 1 tablet (0.4 mg total) under the tongue every 5 (five) minutes as needed for chest pain.   NONFORMULARY OR COMPOUNDED ITEM Trimix (30/1/10)-(Pap/Phent/PGE)  Test Dose 3 58ml vials  Qty #3 [place] [phone] Fax [phone]   ondansetron (ZOFRAN) 8 MG tablet One pill every 8 hours as needed for nausea/vomitting.   pantoprazole (PROTONIX) 40 MG tablet Take 1 tablet (40 mg total) by mouth daily.   Polyethyl Glycol-Propyl Glycol (LUBRICANT EYE DROPS) 0.4-0.3 % SOLN Place 1-2 drops into both eyes 3 (three) times daily as needed (burning eyes.).   prochlorperazine (COMPAZINE) 10 MG tablet Take 1 tablet (10 mg total) by mouth every 6 (six) hours as needed for nausea or vomiting.   traMADol (ULTRAM) 50 MG tablet Take 1 tablet (50 mg total) by mouth 3 (three) times daily as needed for moderate pain.   Vitamin E 450 MG (1000 UT) CAPS Take 1,000 Units by mouth daily.   Zinc 50 MG TABS Take  50 mg by mouth daily.   [DISCONTINUED] simvastatin (ZOCOR) 40 MG tablet Take 1 tablet (40 mg total) by mouth at bedtime.     Allergies:   Flomax [tamsulosin]   Social History   Socioeconomic History   Marital status: Single    Spouse name: Not on file   Number of children: Not on file   Years of education: 12   Highest education level: Not on file  Occupational History   Occupation: retired    Comment: Agricultural consultant  Tobacco Use   Smoking status: Former    Packs/day: 2.50    Years: 40.00    Pack years: 100.00    Types: Cigarettes    Quit date: 03/24/2000    Years since quitting: 20.7   Smokeless tobacco: Never  Vaping Use   Vaping Use: Never used  Substance and Sexual Activity   Alcohol use: Yes    Comment: occassionally   Drug use: No   Sexual activity: Yes  Other Topics Concern   Not on file  Social History Narrative   Singled; lives with son and dog    Occ: retired, back part time at [employer];    Activity: gym 4-5d/wk   Diet: good water, fruits/vegetables daily   Caffeine Use-yes      ------------------------------------       Car sales- retd; [employer] store- retd; quit smoking 2001. Alcohol couple nights a week. Live in [place]. Daughter lives 10 mins.    Social Determinants of Health   Financial Resource Strain: Low Risk    Difficulty of Paying Living Expenses: Not hard at all  Food Insecurity: No Food Insecurity   Worried About Charity fundraiser in the Last Year: Never true   [place] in the Last Year: Never true  Transportation Needs: No Transportation Needs   Lack of Transportation (Medical): No   Lack of Transportation (Non-Medical): No  Physical Activity: Sufficiently Active   Days of Exercise per Week: 7 days   Minutes of Exercise per Session: 60 min  Stress: No Stress Concern Present   Feeling of Stress : Not at all  Social Connections: Not on file     Family History: [name] family history includes Cancer in his paternal  grandfather; Diabetes in his father; Heart disease in his mother; Heart disease (age of onset: 40) in his brother; Hypertension in his father and mother; Lymphoma in his sister. There is no history of Colon cancer, Prostate cancer, Bladder Cancer, or Kidney cancer.  ROS:   Please see the history of present illness.     All other systems reviewed and are negative.  EKGs/Labs/Other Studies Reviewed:    The following studies were reviewed today:  Echo 06/2017 Study Conclusions   - Left ventricle: The cavity size was normal. Systolic function was    normal. The estimated ejection fraction was in the range of 55%    to 60%. Wall motion was normal; there were no regional wall    motion abnormalities. Doppler parameters are consistent with    abnormal left ventricular relaxation (grade 1 diastolic    dysfunction).  - Mitral valve: Mildly calcified annulus. There was mild    regurgitation.  - Left atrium: The atrium was mildly dilated.  - Pulmonary arteries: Systolic pressure could not be accurately    estimated.    Cardiac cath 06/2017 Ost LAD lesion is 80% stenosed. 1st Mrg lesion is 70% stenosed. 2nd Mrg-2 lesion is 70% stenosed. 2nd Mrg-1 lesion is 70% stenosed. Dist RCA lesion is 70% stenosed. Mid LAD lesion is 90% stenosed. Origin to Prox Graft lesion is 100% stenosed. Mid RCA-1 lesion is 60% stenosed. Mid RCA-2 lesion is 90% stenosed. The left ventricular ejection fraction is 55-65% by visual estimate. There is no aortic valve stenosis. There is no aortic valve regurgitation. Final Conclusions:   Severe three vessel disease, occluded SVG to the RCA, Patent SVG to OM 1 and OM2, patent LIMA to the LAD  Recommendations:  New occluded RCA  Discussed with him in detail Will try medical management first Add plavix, nitrates If he requests intervention, will have to be performed in [place] with atherectomy  [name] 06/10/2017, 9:59 AM   EKG:  EKG is ordered today.  The ekg  ordered today demonstrates NSR, 83bpm, old TWI inf leads.   Recent Labs: 03/15/2020: TSH 0.76 12/06/2020: ALT 17; BUN 19; Creatinine, Ser 0.96; Hemoglobin 14.0; Platelets 282; Potassium 4.3; Sodium 137  Recent Lipid Panel    Component Value Date/Time   CHOL 119 03/15/2020 0804   TRIG 46.0 03/15/2020 0804   HDL 47.60 03/15/2020 0804   CHOLHDL 2 03/15/2020 0804   VLDL 9.2 03/15/2020 0804   LDLCALC 62 03/15/2020 0804   LDLDIRECT 63.0 08/17/2015 0824     Physical Exam:    VS:  BP (!) 110/56 (BP Location: Left Arm, Patient Position: Sitting, Cuff Size: Large)   Pulse 83   Ht 5\' 8"  (1.727 m)   Wt 206 lb (93.4 kg)   SpO2 95%   BMI 31.32 kg/m     Wt Readings from Last 3 Encounters:  12/11/20 206 lb (93.4 kg)  11/15/20 201 lb 9.6 oz (91.4 kg)  11/09/20 200 lb (90.7 kg)     GEN:  Well nourished, well developed in no acute distress HEENT: Normal NECK: No JVD; No carotid bruits LYMPHATICS: No lymphadenopathy CARDIAC: RRR, no murmurs, rubs, gallops RESPIRATORY:  Clear to auscultation without rales, wheezing or rhonchi  ABDOMEN: Soft, non-tender, non-distended MUSCULOSKELETAL:  No edema; No deformity  SKIN: Warm and dry NEUROLOGIC:  Alert and oriented x 3 PSYCHIATRIC:  Normal affect   ASSESSMENT:    1. Coronary artery disease involving native coronary artery of native heart with unstable angina pectoris (HCC)   2. Paroxysmal atrial fibrillation ([place])   3. Essential hypertension   4. S/P CABG x 4   5. Hyperlipidemia LDL goal <70   6. Postoperative atrial fibrillation (HCC)   7. Non-small cell cancer of left lung ([place])   8. PAD (peripheral artery disease) (HCC)    PLAN:    In order of problems listed above:  CAD s/p CABG x4 Patient denies chest pain or shortness of breath. He exercises daily and has no exertional symptoms. He is taking Aspirin. Plavix stopped previously by [facility] per patient report. Continue BB, statin, ASA. No further work-up at this time.  Non small cell  lung cancer Diagnosed about 3 months ago and is undergoing chemotherapy. Reports decreased energy with this. Following closely with oncology.  H/o post op Paroxysmal Afib in 2016 Isolated post-op afib event with no known recurrence, not on anticoagulation. He  remains in SR today. CHADSVAC at least 4 (HTN, PAD, agex2). Continue Toprol for rate control. If he has recurrence, anticoagulation would need to be discussed.  Dyslipidemia Lipids at follow-up. Continue statin.  HTN BP Well controlled. Continue lisinopril 5mg  daily and Toprol-XL 25mg  daily.   PAD with prior stenting Has chronic claudication symptoms. He is planning on seeing Vascular surgery later this month. Continue Aspirin. Patient reported plavix previously stopped by [facility]. Continue Aspirin and statin.  Disposition: Follow up in 4 month(s) with MD    Signed, Deliana Avalos Ninfa Meeker, PA-C  12/11/2020 3:46 PM    [facility]

## 2020-12-12 ENCOUNTER — Encounter: Payer: Self-pay | Admitting: *Deleted

## 2020-12-13 DIAGNOSIS — M9903 Segmental and somatic dysfunction of lumbar region: Secondary | ICD-10-CM | POA: Diagnosis not present

## 2020-12-13 DIAGNOSIS — M62830 Muscle spasm of back: Secondary | ICD-10-CM | POA: Diagnosis not present

## 2020-12-13 DIAGNOSIS — M955 Acquired deformity of pelvis: Secondary | ICD-10-CM | POA: Diagnosis not present

## 2020-12-13 DIAGNOSIS — M9905 Segmental and somatic dysfunction of pelvic region: Secondary | ICD-10-CM | POA: Diagnosis not present

## 2020-12-17 ENCOUNTER — Inpatient Hospital Stay: Payer: PPO

## 2020-12-17 ENCOUNTER — Other Ambulatory Visit: Payer: Self-pay

## 2020-12-17 DIAGNOSIS — C3432 Malignant neoplasm of lower lobe, left bronchus or lung: Secondary | ICD-10-CM

## 2020-12-17 DIAGNOSIS — Z5112 Encounter for antineoplastic immunotherapy: Secondary | ICD-10-CM | POA: Diagnosis not present

## 2020-12-17 LAB — CBC WITH DIFFERENTIAL/PLATELET
Abs Immature Granulocytes: 4.39 10*3/uL — ABNORMAL HIGH (ref 0.00–0.07)
Basophils Absolute: 0 10*3/uL (ref 0.0–0.1)
Basophils Relative: 0 %
Eosinophils Absolute: 0.1 10*3/uL (ref 0.0–0.5)
Eosinophils Relative: 0 %
HCT: 39.2 % (ref 39.0–52.0)
Hemoglobin: 13.1 g/dL (ref 13.0–17.0)
Immature Granulocytes: 13 %
Lymphocytes Relative: 9 %
Lymphs Abs: 2.9 10*3/uL (ref 0.7–4.0)
MCH: 31 pg (ref 26.0–34.0)
MCHC: 33.4 g/dL (ref 30.0–36.0)
MCV: 92.9 fL (ref 80.0–100.0)
Monocytes Absolute: 1.5 10*3/uL — ABNORMAL HIGH (ref 0.1–1.0)
Monocytes Relative: 5 %
Neutro Abs: 24.4 10*3/uL — ABNORMAL HIGH (ref 1.7–7.7)
Neutrophils Relative %: 73 %
Platelets: 190 10*3/uL (ref 150–400)
RBC: 4.22 MIL/uL (ref 4.22–5.81)
RDW: 14.4 % (ref 11.5–15.5)
Smear Review: NORMAL
WBC: 33.3 10*3/uL — ABNORMAL HIGH (ref 4.0–10.5)
nRBC: 0.1 % (ref 0.0–0.2)

## 2020-12-17 LAB — BASIC METABOLIC PANEL
Anion gap: 8 (ref 5–15)
BUN: 22 mg/dL (ref 8–23)
CO2: 23 mmol/L (ref 22–32)
Calcium: 9 mg/dL (ref 8.9–10.3)
Chloride: 103 mmol/L (ref 98–111)
Creatinine, Ser: 1.04 mg/dL (ref 0.61–1.24)
GFR, Estimated: >60 mL/min (ref >60)
Glucose, Bld: 103 mg/dL — ABNORMAL HIGH (ref 70–99)
Potassium: 4.3 mmol/L (ref 3.5–5.1)
Sodium: 134 mmol/L — ABNORMAL LOW (ref 135–145)

## 2020-12-18 ENCOUNTER — Encounter (HOSPITAL_COMMUNITY): Payer: Self-pay

## 2020-12-18 ENCOUNTER — Ambulatory Visit: Payer: PPO | Admitting: Internal Medicine

## 2020-12-18 ENCOUNTER — Encounter: Payer: Self-pay | Admitting: Internal Medicine

## 2020-12-18 VITALS — BP 110/58 | HR 70 | Temp 97.7°F | Ht 68.0 in | Wt 208.6 lb

## 2020-12-18 DIAGNOSIS — C3432 Malignant neoplasm of lower lobe, left bronchus or lung: Secondary | ICD-10-CM | POA: Diagnosis not present

## 2020-12-18 DIAGNOSIS — G4733 Obstructive sleep apnea (adult) (pediatric): Secondary | ICD-10-CM

## 2020-12-18 NOTE — Progress Notes (Signed)
[facility] Consultation      Date: 12/18/2020,   MRN# [id number] Randall Ali 01/20/1941  ONCOLOGY HOSTORY    CHIEF COMPLAINT:  Follow-up OSA Follow up Lung cancer  follo  HISTORY OF PRESENT ILLNESS    Assessment of OSA Currently on BiPAP IPAP 20 EPAP 10  Compliance report reviewed with patient Previous report in November 2020 shows great compliance with AHI down to 2.4 Minimal leak  Current compliance report is 100% for days and greater than 4 hours AHI is down to 0.4 Minimal leak Tolerating full facemask No significant issues  No exacerbation at this time No evidence of heart failure at this time No evidence or signs of infection at this time No respiratory distress No fevers, chills, nausea, vomiting, diarrhea No evidence of lower extremity edema No evidence hemoptysis  Former smoker 3 to 4 packs a day for 50 years quit 2001 CABG 2016    CT chest November 20, 2016 Shows right upper lobe nodule approximately 6.9 mm which has not changed since 2016 Patient has known about this right upper lobe nodule for the last 6 years After further evaluation right upper lobe lung nodule is benign Ct chest 5 /2022 LLL mass s/p Biopsy dx with Lung cancer   ONCOLOGY HISTORY   #MAY 2022-Lung cancer-non-small cell [CT guided bx] T3N1 vs stage IV Dr.Hendrickson.  MRI brain negative for malignancy.# 1. April 2022- LLL ~4.0 cm mass in the superior segment left lower lobe abuts the major fissure and the posterior pleural surface without visible chest wall invasion without pleural effusion. There 2-3 other small nodules in the left lower lobe, largest measures 9 mm in diameter. These are suspicious for same lobe satellite lesions and there is left hilar adenopathy. Assuming non-small cell lung cancer the appearance is compatible with T3 N1 M0 disease (stage IIIA).   # right suprarenal nodule-awaiting biopsy on 5/31- non-small cell [revived at tumor conference  at [facility]   # June 9th 2022- CARBO-TAXOl-KEYTRUDA; Fulphila   MOLECULAR TESTING: NGS TPS-PDL 100%; EXON 12 amplification* mutations.       # CAD [CABG 2016; Dr.Gollan] Current Medication:  Current Outpatient Medications:    acetaminophen (TYLENOL) 325 MG tablet, Take by mouth as needed., Disp: , Rfl:    Ascorbic Acid (VITAMIN C) 1000 MG tablet, Take 1,000 mg by mouth daily., Disp: , Rfl:    aspirin 81 MG EC tablet, Take 81 mg by mouth daily.  , Disp: , Rfl:    Calcium-Magnesium-Vitamin D (CALCIUM 1200+D3 PO), Take 1 tablet by mouth daily., Disp: , Rfl:    Carboxymethylcellul-Glycerin (LUBRICATING EYE DROPS OP), Place 1 drop into both eyes daily as needed (irritation)., Disp: , Rfl:    Cholecalciferol (VITAMIN D3) 50 MCG (2000 UT) TABS, Take 2,000 Units by mouth daily., Disp: , Rfl:    Cyanocobalamin (B-12) 5000 MCG CAPS, Take 5,000 mcg by mouth daily., Disp: , Rfl:    docusate sodium (COLACE) 100 MG capsule, Take 100 mg by mouth at bedtime., Disp: , Rfl:    lidocaine-prilocaine (EMLA) cream, Apply 30 -45 mins prior to port access., Disp: 30 g, Rfl: 0   lisinopril (ZESTRIL) 5 MG tablet, TAKE 1 TABLET BY MOUTH DAILY, Disp: 90 tablet, Rfl: 3   Melatonin 10 MG CAPS, Take 10 mg by mouth at bedtime as needed (sleep)., Disp: , Rfl:    metoprolol succinate (TOPROL-XL) 25 MG 24 hr tablet, TAKE 1/2 TABLET (12.5 MG) BY MOUTH DAILY, Disp: 45 tablet, Rfl:  3   Niacinamide-Zn-Cu-Methfo-Se-Cr (NICOTINAMIDE PO), Take 500 mg by mouth daily., Disp: , Rfl:    nitroGLYCERIN (NITROSTAT) 0.4 MG SL tablet, Place 1 tablet (0.4 mg total) under the tongue every 5 (five) minutes as needed for chest pain., Disp: 25 tablet, Rfl: 3   NONFORMULARY OR COMPOUNDED ITEM, Trimix (30/1/10)-(Pap/Phent/PGE)  Test Dose 3 75ml vials  Qty #3 Refills 0  [place] [phone] Fax [phone], Disp: 3 each, Rfl: 0   ondansetron (ZOFRAN) 8 MG tablet, One pill every 8 hours as needed for nausea/vomitting., Disp: 40 tablet,  Rfl: 1   pantoprazole (PROTONIX) 40 MG tablet, Take 1 tablet (40 mg total) by mouth daily., Disp: 90 tablet, Rfl: 3   Polyethyl Glycol-Propyl Glycol (LUBRICANT EYE DROPS) 0.4-0.3 % SOLN, Place 1-2 drops into both eyes 3 (three) times daily as needed (burning eyes.)., Disp: , Rfl:    prochlorperazine (COMPAZINE) 10 MG tablet, Take 1 tablet (10 mg total) by mouth every 6 (six) hours as needed for nausea or vomiting., Disp: 40 tablet, Rfl: 1   simvastatin (ZOCOR) 40 MG tablet, TAKE 1 TABLET BY MOUTH AT BEDTIME, Disp: 90 tablet, Rfl: 0   traMADol (ULTRAM) 50 MG tablet, Take 1 tablet (50 mg total) by mouth 3 (three) times daily as needed for moderate pain., Disp: 90 tablet, Rfl: 0   Vitamin E 450 MG (1000 UT) CAPS, Take 1,000 Units by mouth daily., Disp: , Rfl:    Zinc 50 MG TABS, Take 50 mg by mouth daily., Disp: , Rfl:     ALLERGIES   Flomax [tamsulosin]     REVIEW OF SYSTEMS   Review of Systems  Constitutional:  Positive for malaise/fatigue. Negative for chills, diaphoresis, fever and weight loss.  HENT:  Negative for congestion and hearing loss.   Respiratory:  Negative for cough, hemoptysis, sputum production, shortness of breath and wheezing.   Cardiovascular:  Negative for chest pain, palpitations and orthopnea.  Gastrointestinal:  Negative for heartburn.  Skin:  Negative for rash.  Neurological:  Negative for weakness.  All other systems reviewed and are negative. .vs.vs  BP (!) 110/58 (BP Location: Left Arm, Cuff Size: Large)   Pulse 70   SpO2 96%    PHYSICAL EXAM  Physical Exam Constitutional:      General: He is not in acute distress.    Appearance: He is well-developed.  Eyes:     General: No scleral icterus. Cardiovascular:     Rate and Rhythm: Normal rate and regular rhythm.     Heart sounds: Normal heart sounds. No murmur heard. Pulmonary:     Effort: Pulmonary effort is normal. No respiratory distress.     Breath sounds: Normal breath sounds. No stridor. No  wheezing.  Musculoskeletal:        General: Normal range of motion.     Cervical back: Neck supple.  Skin:    General: Skin is warm.  Neurological:     Mental Status: He is alert and oriented to person, place, and time.     Cranial Nerves: No cranial nerve deficit.        IMAGING    CT chest November 20, 2016 I have Independently reviewed images of  CT chest   Interpretation:6.9 MM RUL nodule noted Has not change in size of the last 2 years    ASSESSMENT/PLAN   80 year old pleasant white male seen today for follow-up sleep apnea  Patient has history of right upper lobe lung nodule which is benign  Patient  with newly diagnosed LLL mass with lung cancer  Setting of obesity and deconditioned state    Assessment of OSA  Patient with excellent compliance  Continue therapy as prescribed   OSA Patient is using and benefiting from BiPAP therapy nightly No issues with the mask at this time Wears full facemask BiPAP 20/10 Excellent compliance report at this time  Right upper lobe lung nodule No change in size for the last 2 years +Lung mass LL c/w Lung cancer Kidney Mass follow up oncology  Obesity -recommend significant weight loss -recommend changing diet  Deconditioned state -Recommend increased daily activity and exercise       MEDICATION ADJUSTMENTS/LABS AND TESTS ORDERED: Continue BiPAP as prescribed   CURRENT MEDICATIONS REVIEWED AT LENGTH WITH PATIENT TODAY   Patient satisfied with Plan of action and management. All questions answered  Follow up In 1 year  Total time spent 25 mins   Chauntel Windsor [name], M.D.  [facility]  Medical Director [place] Director [facility]

## 2020-12-18 NOTE — Patient Instructions (Signed)
Continue therapy for OSA as prescribed Follow-up with oncology as scheduled

## 2020-12-24 ENCOUNTER — Telehealth: Payer: Self-pay | Admitting: Cardiovascular Disease

## 2020-12-24 MED ORDER — NITROGLYCERIN 0.4 MG SL SUBL: 0.4000 mg | SUBLINGUAL_TABLET | SUBLINGUAL | 1 refills | Status: DC | PRN

## 2020-12-24 NOTE — Telephone Encounter (Signed)
*  STAT* If patient is at the pharmacy, call can be transferred to refill team.   1. Which medications need to be refilled? (please list name of each medication and dose if known) nitroglycerin   2. Which pharmacy/location (including street and city if local pharmacy) is medication to be sent to? [facility]   3. Do they need a 30 day or 90 day supply? 1 bottle with refills

## 2020-12-24 NOTE — Telephone Encounter (Signed)
Requested Prescriptions   Signed Prescriptions Disp Refills   nitroGLYCERIN (NITROSTAT) 0.4 MG SL tablet 25 tablet 1    Sig: Place 1 tablet (0.4 mg total) under the tongue every 5 (five) minutes as needed for up to 25 doses for chest pain.    Authorizing Provider: Minna Merritts    Ordering User: Britt Bottom

## 2020-12-25 ENCOUNTER — Other Ambulatory Visit (INDEPENDENT_AMBULATORY_CARE_PROVIDER_SITE_OTHER): Payer: Self-pay | Admitting: Vascular Surgery

## 2020-12-25 DIAGNOSIS — I739 Peripheral vascular disease, unspecified: Secondary | ICD-10-CM

## 2020-12-26 ENCOUNTER — Ambulatory Visit (INDEPENDENT_AMBULATORY_CARE_PROVIDER_SITE_OTHER): Payer: PPO

## 2020-12-26 ENCOUNTER — Other Ambulatory Visit: Payer: Self-pay

## 2020-12-26 ENCOUNTER — Encounter (INDEPENDENT_AMBULATORY_CARE_PROVIDER_SITE_OTHER): Payer: Self-pay | Admitting: Nurse Practitioner

## 2020-12-26 ENCOUNTER — Ambulatory Visit (INDEPENDENT_AMBULATORY_CARE_PROVIDER_SITE_OTHER): Payer: PPO | Admitting: Nurse Practitioner

## 2020-12-26 VITALS — BP 113/64 | HR 76 | Resp 16 | Wt 210.0 lb

## 2020-12-26 DIAGNOSIS — M9903 Segmental and somatic dysfunction of lumbar region: Secondary | ICD-10-CM | POA: Diagnosis not present

## 2020-12-26 DIAGNOSIS — M9905 Segmental and somatic dysfunction of pelvic region: Secondary | ICD-10-CM | POA: Diagnosis not present

## 2020-12-26 DIAGNOSIS — I70213 Atherosclerosis of native arteries of extremities with intermittent claudication, bilateral legs: Secondary | ICD-10-CM

## 2020-12-26 DIAGNOSIS — I10 Essential (primary) hypertension: Secondary | ICD-10-CM | POA: Diagnosis not present

## 2020-12-26 DIAGNOSIS — I739 Peripheral vascular disease, unspecified: Secondary | ICD-10-CM | POA: Diagnosis not present

## 2020-12-26 DIAGNOSIS — M955 Acquired deformity of pelvis: Secondary | ICD-10-CM | POA: Diagnosis not present

## 2020-12-26 DIAGNOSIS — C3432 Malignant neoplasm of lower lobe, left bronchus or lung: Secondary | ICD-10-CM | POA: Diagnosis not present

## 2020-12-26 DIAGNOSIS — M62830 Muscle spasm of back: Secondary | ICD-10-CM | POA: Diagnosis not present

## 2020-12-27 ENCOUNTER — Other Ambulatory Visit: Payer: Self-pay

## 2020-12-27 ENCOUNTER — Inpatient Hospital Stay (HOSPITAL_BASED_OUTPATIENT_CLINIC_OR_DEPARTMENT_OTHER): Payer: PPO | Admitting: Internal Medicine

## 2020-12-27 ENCOUNTER — Inpatient Hospital Stay: Payer: PPO

## 2020-12-27 ENCOUNTER — Other Ambulatory Visit: Payer: Self-pay | Admitting: *Deleted

## 2020-12-27 DIAGNOSIS — C3432 Malignant neoplasm of lower lobe, left bronchus or lung: Secondary | ICD-10-CM

## 2020-12-27 DIAGNOSIS — R5383 Other fatigue: Secondary | ICD-10-CM

## 2020-12-27 DIAGNOSIS — Z5112 Encounter for antineoplastic immunotherapy: Secondary | ICD-10-CM | POA: Diagnosis not present

## 2020-12-27 LAB — COMPREHENSIVE METABOLIC PANEL
ALT: 14 U/L (ref 0–44)
AST: 15 U/L (ref 15–41)
Albumin: 4.2 g/dL (ref 3.5–5.0)
Alkaline Phosphatase: 68 U/L (ref 38–126)
Anion gap: 8 (ref 5–15)
BUN: 21 mg/dL (ref 8–23)
CO2: 23 mmol/L (ref 22–32)
Calcium: 9.2 mg/dL (ref 8.9–10.3)
Chloride: 107 mmol/L (ref 98–111)
Creatinine, Ser: 0.99 mg/dL (ref 0.61–1.24)
GFR, Estimated: >60 mL/min (ref >60)
Glucose, Bld: 103 mg/dL — ABNORMAL HIGH (ref 70–99)
Potassium: 4.2 mmol/L (ref 3.5–5.1)
Sodium: 138 mmol/L (ref 135–145)
Total Bilirubin: 0.7 mg/dL (ref 0.3–1.2)
Total Protein: 6.8 g/dL (ref 6.5–8.1)

## 2020-12-27 LAB — CBC WITH DIFFERENTIAL/PLATELET
Abs Immature Granulocytes: 0.06 10*3/uL (ref 0.00–0.07)
Basophils Absolute: 0.1 10*3/uL (ref 0.0–0.1)
Basophils Relative: 1 %
Eosinophils Absolute: 0 10*3/uL (ref 0.0–0.5)
Eosinophils Relative: 1 %
HCT: 39.3 % (ref 39.0–52.0)
Hemoglobin: 13.1 g/dL (ref 13.0–17.0)
Immature Granulocytes: 1 %
Lymphocytes Relative: 16 %
Lymphs Abs: 1.1 10*3/uL (ref 0.7–4.0)
MCH: 30.9 pg (ref 26.0–34.0)
MCHC: 33.3 g/dL (ref 30.0–36.0)
MCV: 92.7 fL (ref 80.0–100.0)
Monocytes Absolute: 0.8 10*3/uL (ref 0.1–1.0)
Monocytes Relative: 11 %
Neutro Abs: 4.9 10*3/uL (ref 1.7–7.7)
Neutrophils Relative %: 70 %
Platelets: 186 10*3/uL (ref 150–400)
RBC: 4.24 MIL/uL (ref 4.22–5.81)
RDW: 14.6 % (ref 11.5–15.5)
WBC: 6.9 10*3/uL (ref 4.0–10.5)
nRBC: 0 % (ref 0.0–0.2)

## 2020-12-27 LAB — TSH: TSH: 1.157 u[IU]/mL (ref 0.350–4.500)

## 2020-12-27 MED ORDER — SODIUM CHLORIDE 0.9 % IV SOLN
200.0000 mg/m2 | Freq: Once | INTRAVENOUS | Status: AC
  Administered 2020-12-27: 420 mg via INTRAVENOUS
  Filled 2020-12-27: qty 70

## 2020-12-27 MED ORDER — SODIUM CHLORIDE 0.9 % IV SOLN
150.0000 mg | Freq: Once | INTRAVENOUS | Status: AC
  Administered 2020-12-27: 150 mg via INTRAVENOUS
  Filled 2020-12-27: qty 150

## 2020-12-27 MED ORDER — FAMOTIDINE 20 MG IN NS 100 ML IVPB
20.0000 mg | Freq: Once | INTRAVENOUS | Status: AC
Start: 2020-12-27 — End: 2020-12-27
  Administered 2020-12-27: 20 mg via INTRAVENOUS
  Filled 2020-12-27: qty 20

## 2020-12-27 MED ORDER — TRIAMCINOLONE ACETONIDE 0.5 % EX OINT: 1.0000 "application " | TOPICAL_OINTMENT | Freq: Two times a day (BID) | CUTANEOUS | 0 refills | Status: DC

## 2020-12-27 MED ORDER — SODIUM CHLORIDE 0.9 % IV SOLN
10.0000 mg | Freq: Once | INTRAVENOUS | Status: AC
  Administered 2020-12-27: 10 mg via INTRAVENOUS
  Filled 2020-12-27: qty 10

## 2020-12-27 MED ORDER — SODIUM CHLORIDE 0.9 % IV SOLN
526.5000 mg | Freq: Once | INTRAVENOUS | Status: AC
  Administered 2020-12-27: 530 mg via INTRAVENOUS
  Filled 2020-12-27: qty 53

## 2020-12-27 MED ORDER — DIPHENHYDRAMINE HCL 50 MG/ML IJ SOLN
50.0000 mg | Freq: Once | INTRAMUSCULAR | Status: AC
  Administered 2020-12-27: 50 mg via INTRAVENOUS
  Filled 2020-12-27: qty 1

## 2020-12-27 MED ORDER — SODIUM CHLORIDE 0.9 % IV SOLN
Freq: Once | INTRAVENOUS | Status: AC
  Filled 2020-12-27: qty 250

## 2020-12-27 MED ORDER — HEPARIN SOD (PORK) LOCK FLUSH 100 UNIT/ML IV SOLN
INTRAVENOUS | Status: AC
  Filled 2020-12-27: qty 5

## 2020-12-27 MED ORDER — HEPARIN SOD (PORK) LOCK FLUSH 100 UNIT/ML IV SOLN
500.0000 [IU] | Freq: Once | INTRAVENOUS | Status: AC | PRN
  Administered 2020-12-27: 500 [IU]
  Filled 2020-12-27: qty 5

## 2020-12-27 MED ORDER — PEMBROLIZUMAB CHEMO INJECTION 100 MG/4ML
200.0000 mg | Freq: Once | INTRAVENOUS | Status: AC
  Administered 2020-12-27: 200 mg via INTRAVENOUS
  Filled 2020-12-27: qty 8

## 2020-12-27 MED ORDER — PALONOSETRON HCL INJECTION 0.25 MG/5ML
0.2500 mg | Freq: Once | INTRAVENOUS | Status: AC
  Administered 2020-12-27: 0.25 mg via INTRAVENOUS
  Filled 2020-12-27: qty 5

## 2020-12-27 NOTE — Assessment & Plan Note (Addendum)
#  Lung cancer-non-small cell stage IV [right suprarenal nodule-s/p biopsy "non-small cell ca"].  Currently on CarboTaxol plus Keytruda.  #Proceed with cycle #3-CarboTaxol Keytruda today. Labs today reviewed;  acceptable for treatment today.  Plan to get imaging after this cycle.  # CAD-s/p CABG- STABLE; no decompensation   # PVD- noted however given claudication pain s/p s/p follow-up with vascular; as per patient-he was recommended a stent [Dr.Schneir]-discussed that we will be happy to coordinate with vascular surgery given ongoing therapy.  # Skin rash- back/torso- G-1-2-itching- not improved on hydrocortisone; add kenalog. Benadryl 25 mg PO qhs.   # DISPOSITION: D-2 fulphila as planned.  #  chemo today # labs in 10 days- cbc/bmp # follow up in 3 weeks- MD; labs- cbc/cmp;  [carbo-taxol-keytruda]; d-2; fuphila; CT  Prior- Dr.B  Cc; Dr.Schnier.

## 2020-12-27 NOTE — Patient Instructions (Signed)
[facility]  Discharge Instructions: Thank you for choosing [facility] to provide your oncology and hematology care.  If you have a lab appointment with the [facility], please go directly to the [facility] and check in at the registration area.  Wear comfortable clothing and clothing appropriate for easy access to any Portacath or PICC line.   We strive to give you quality time with your provider. You may need to reschedule your appointment if you arrive late (15 or more minutes).  Arriving late affects you and other patients whose appointments are after yours.  Also, if you miss three or more appointments without notifying the office, you may be dismissed from the clinic at the provider's discretion.      For prescription refill requests, have your pharmacy contact our office and allow 72 hours for refills to be completed.    Today you received the following chemotherapy and/or immunotherapy agents: Taxol, Keytruda, Carboplatin      To help prevent nausea and vomiting after your treatment, we encourage you to take your nausea medication as directed.  BELOW ARE SYMPTOMS THAT SHOULD BE REPORTED IMMEDIATELY: *FEVER GREATER THAN 100.4 F (38 C) OR HIGHER *CHILLS OR SWEATING *NAUSEA AND VOMITING THAT IS NOT CONTROLLED WITH YOUR NAUSEA MEDICATION *UNUSUAL SHORTNESS OF BREATH *UNUSUAL BRUISING OR BLEEDING *URINARY PROBLEMS (pain or burning when urinating, or frequent urination) *BOWEL PROBLEMS (unusual diarrhea, constipation, pain near the anus) TENDERNESS IN MOUTH AND THROAT WITH OR WITHOUT PRESENCE OF ULCERS (sore throat, sores in mouth, or a toothache) UNUSUAL RASH, SWELLING OR PAIN  UNUSUAL VAGINAL DISCHARGE OR ITCHING   Items with * indicate a potential emergency and should be followed up as soon as possible or go to the Emergency Department if any problems should occur.  Please show the CHEMOTHERAPY ALERT CARD or IMMUNOTHERAPY  ALERT CARD at check-in to the Emergency Department and triage nurse.  Should you have questions after your visit or need to cancel or reschedule your appointment, please contact [facility]  [phone] and follow the prompts.  Office hours are 8:00 a.m. to 4:30 p.m. Monday - Friday. Please note that voicemails left after 4:00 p.m. may not be returned until the following business day.  We are closed weekends and major holidays. You have access to a nurse at all times for urgent questions. Please call the main number to the clinic [phone] and follow the prompts.  For any non-urgent questions, you may also contact your provider using MyChart. We now offer e-Visits for anyone 32 and older to request care online for non-urgent symptoms. For details visit [email or web address].   Also download the MyChart app! Go to the app store, search "MyChart", open the app, select [facility], and log in with your MyChart username and password.  Due to Covid, a mask is required upon entering the hospital/clinic. If you do not have a mask, one will be given to you upon arrival. For doctor visits, patients may have 1 support person aged 79 or older with them. For treatment visits, patients cannot have anyone with them due to current Covid guidelines and our immunocompromised population. Carboplatin injection What is this medication? CARBOPLATIN (KAR boe pla tin) is a chemotherapy drug. It targets fast dividing cells, like cancer cells, and causes these cells to die. This medicine is usedto treat ovarian cancer and many other cancers. This medicine may be used for other purposes; ask your health care provider orpharmacist if  you have questions. COMMON BRAND NAME(S): Paraplatin What should I tell my care team before I take this medication? They need to know if you have any of these conditions: blood disorders hearing problems kidney disease recent or ongoing radiation  therapy an unusual or allergic reaction to carboplatin, cisplatin, other chemotherapy, other medicines, foods, dyes, or preservatives pregnant or trying to get pregnant breast-feeding How should I use this medication? This drug is usually given as an infusion into a vein. It is administered in [facility] or clinic by a specially trained health care professional. Talk to your pediatrician regarding the use of this medicine in children.Special care may be needed. Overdosage: If you think you have taken too much of this medicine contact apoison control center or emergency room at once. NOTE: This medicine is only for you. Do not share this medicine with others. What if I miss a dose? It is important not to miss a dose. Call your doctor or health careprofessional if you are unable to keep an appointment. What may interact with this medication? medicines for seizures medicines to increase blood counts like filgrastim, pegfilgrastim, sargramostim some antibiotics like amikacin, gentamicin, neomycin, streptomycin, tobramycin vaccines Talk to your doctor or health care professional before taking any of thesemedicines: acetaminophen aspirin ibuprofen ketoprofen naproxen This list may not describe all possible interactions. Give your health care provider a list of all the medicines, herbs, non-prescription drugs, or dietary supplements you use. Also tell them if you smoke, drink alcohol, or use illegaldrugs. Some items may interact with your medicine. What should I watch for while using this medication? Your condition will be monitored carefully while you are receiving this medicine. You will need important blood work done while you are taking thismedicine. This drug may make you feel generally unwell. This is not uncommon, as chemotherapy can affect healthy cells as well as cancer cells. Report any side effects. Continue your course of treatment even though you feel ill unless yourdoctor tells you to  stop. In some cases, you may be given additional medicines to help with side effects.Follow all directions for their use. Call your doctor or health care professional for advice if you get a fever, chills or sore throat, or other symptoms of a cold or flu. Do not treat yourself. This drug decreases your body's ability to fight infections. Try toavoid being around people who are sick. This medicine may increase your risk to bruise or bleed. Call your doctor orhealth care professional if you notice any unusual bleeding. Be careful brushing and flossing your teeth or using a toothpick because you may get an infection or bleed more easily. If you have any dental work done,tell your dentist you are receiving this medicine. Avoid taking products that contain aspirin, acetaminophen, ibuprofen, naproxen, or ketoprofen unless instructed by your doctor. These medicines may hide afever. Do not become pregnant while taking this medicine. Women should inform their doctor if they wish to become pregnant or think they might be pregnant. There is a potential for serious side effects to an unborn child. Talk to your health care professional or pharmacist for more information. Do not breast-feed aninfant while taking this medicine. What side effects may I notice from receiving this medication? Side effects that you should report to your doctor or health care professionalas soon as possible: allergic reactions like skin rash, itching or hives, swelling of the face, lips, or tongue signs of infection - fever or chills, cough, sore throat, pain or difficulty passing urine signs of  decreased platelets or bleeding - bruising, pinpoint red spots on the skin, black, tarry stools, nosebleeds signs of decreased red blood cells - unusually weak or tired, fainting spells, lightheadedness breathing problems changes in hearing changes in vision chest pain high blood pressure low blood counts - This drug may decrease the number  of white blood cells, red blood cells and platelets. You may be at increased risk for infections and bleeding. nausea and vomiting pain, swelling, redness or irritation at the injection site pain, tingling, numbness in the hands or feet problems with balance, talking, walking trouble passing urine or change in the amount of urine Side effects that usually do not require medical attention (report to yourdoctor or health care professional if they continue or are bothersome): hair loss loss of appetite metallic taste in the mouth or changes in taste This list may not describe all possible side effects. Call your doctor for medical advice about side effects. You may report side effects to FDA at1-800-FDA-1088. Where should I keep my medication? This drug is given in a hospital or clinic and will not be stored at home. NOTE: This sheet is a summary. It may not cover all possible information. If you have questions about this medicine, talk to your doctor, pharmacist, orhealth care provider.  2022 Elsevier/Gold Standard (2007-08-31 14:38:05) Pembrolizumab injection What is this medication? PEMBROLIZUMAB (pem broe liz ue mab) is a monoclonal antibody. It is used totreat certain types of cancer. This medicine may be used for other purposes; ask your health care provider orpharmacist if you have questions. COMMON BRAND NAME(S): Keytruda What should I tell my care team before I take this medication? They need to know if you have any of these conditions: autoimmune diseases like Crohn's disease, ulcerative colitis, or lupus have had or planning to have an allogeneic stem cell transplant (uses someone else's stem cells) history of organ transplant history of chest radiation nervous system problems like myasthenia gravis or Guillain-Barre syndrome an unusual or allergic reaction to pembrolizumab, other medicines, foods, dyes, or preservatives pregnant or trying to get pregnant breast-feeding How  should I use this medication? This medicine is for infusion into a vein. It is given by a health careprofessional in a hospital or clinic setting. A special MedGuide will be given to you before each treatment. Be sure to readthis information carefully each time. Talk to your pediatrician regarding the use of this medicine in children. While this drug may be prescribed for children as young as 6 months for selectedconditions, precautions do apply. Overdosage: If you think you have taken too much of this medicine contact apoison control center or emergency room at once. NOTE: This medicine is only for you. Do not share this medicine with others. What if I miss a dose? It is important not to miss your dose. Call your doctor or health careprofessional if you are unable to keep an appointment. What may interact with this medication? Interactions have not been studied. This list may not describe all possible interactions. Give your health care provider a list of all the medicines, herbs, non-prescription drugs, or dietary supplements you use. Also tell them if you smoke, drink alcohol, or use illegaldrugs. Some items may interact with your medicine. What should I watch for while using this medication? Your condition will be monitored carefully while you are receiving thismedicine. You may need blood work done while you are taking this medicine. Do not become pregnant while taking this medicine or for 4 months after stopping it.  Women should inform their doctor if they wish to become pregnant or think they might be pregnant. There is a potential for serious side effects to an unborn child. Talk to your health care professional or pharmacist for more information. Do not breast-feed an infant while taking this medicine orfor 4 months after the last dose. What side effects may I notice from receiving this medication? Side effects that you should report to your doctor or health care professionalas soon as  possible: allergic reactions like skin rash, itching or hives, swelling of the face, lips, or tongue bloody or black, tarry breathing problems changes in vision chest pain chills confusion constipation cough diarrhea dizziness or feeling faint or lightheaded fast or irregular heartbeat fever flushing joint pain low blood counts - this medicine may decrease the number of white blood cells, red blood cells and platelets. You may be at increased risk for infections and bleeding. muscle pain muscle weakness pain, tingling, numbness in the hands or feet persistent headache redness, blistering, peeling or loosening of the skin, including inside the mouth signs and symptoms of high blood sugar such as dizziness; dry mouth; dry skin; fruity breath; nausea; stomach pain; increased hunger or thirst; increased urination signs and symptoms of kidney injury like trouble passing urine or change in the amount of urine signs and symptoms of liver injury like dark urine, light-colored stools, loss of appetite, nausea, right upper belly pain, yellowing of the eyes or skin sweating swollen lymph nodes weight loss Side effects that usually do not require medical attention (report to yourdoctor or health care professional if they continue or are bothersome): decreased appetite hair loss tiredness This list may not describe all possible side effects. Call your doctor for medical advice about side effects. You may report side effects to FDA at1-800-FDA-1088. Where should I keep my medication? This drug is given in a hospital or clinic and will not be stored at home. NOTE: This sheet is a summary. It may not cover all possible information. If you have questions about this medicine, talk to your doctor, pharmacist, orhealth care provider.  2022 Elsevier/Gold Standard (2019-04-27 21:44:53) Paclitaxel injection What is this medication? PACLITAXEL (PAK li TAX el) is a chemotherapy drug. It targets fast  dividing cells, like cancer cells, and causes these cells to die. This medicine is used to treat ovarian cancer, breast cancer, lung cancer, Kaposi's sarcoma, andother cancers. This medicine may be used for other purposes; ask your health care provider orpharmacist if you have questions. COMMON BRAND NAME(S): Onxol, Taxol What should I tell my care team before I take this medication? They need to know if you have any of these conditions: history of irregular heartbeat liver disease low blood counts, like low white cell, platelet, or red cell counts lung or breathing disease, like asthma tingling of the fingers or toes, or other nerve disorder an unusual or allergic reaction to paclitaxel, alcohol, polyoxyethylated castor oil, other chemotherapy, other medicines, foods, dyes, or preservatives pregnant or trying to get pregnant breast-feeding How should I use this medication? This drug is given as an infusion into a vein. It is administered in a hospitalor clinic by a specially trained health care professional. Talk to your pediatrician regarding the use of this medicine in children.Special care may be needed. Overdosage: If you think you have taken too much of this medicine contact apoison control center or emergency room at once. NOTE: This medicine is only for you. Do not share this medicine with others. What if  I miss a dose? It is important not to miss your dose. Call your doctor or health careprofessional if you are unable to keep an appointment. What may interact with this medication? Do not take this medicine with any of the following medications: live virus vaccines This medicine may also interact with the following medications: antiviral medicines for hepatitis, HIV or AIDS certain antibiotics like erythromycin and clarithromycin certain medicines for fungal infections like ketoconazole and itraconazole certain medicines for seizures like carbamazepine, phenobarbital,  phenytoin gemfibrozil nefazodone rifampin St. John's wort This list may not describe all possible interactions. Give your health care provider a list of all the medicines, herbs, non-prescription drugs, or dietary supplements you use. Also tell them if you smoke, drink alcohol, or use illegaldrugs. Some items may interact with your medicine. What should I watch for while using this medication? Your condition will be monitored carefully while you are receiving this medicine. You will need important blood work done while you are taking thismedicine. This medicine can cause serious allergic reactions. To reduce your risk you will need to take other medicine(s) before treatment with this medicine. If you experience allergic reactions like skin rash, itching or hives, swelling of theface, lips, or tongue, tell your doctor or health care professional right away. In some cases, you may be given additional medicines to help with side effects.Follow all directions for their use. This drug may make you feel generally unwell. This is not uncommon, as chemotherapy can affect healthy cells as well as cancer cells. Report any side effects. Continue your course of treatment even though you feel ill unless yourdoctor tells you to stop. Call your doctor or health care professional for advice if you get a fever, chills or sore throat, or other symptoms of a cold or flu. Do not treat yourself. This drug decreases your body's ability to fight infections. Try toavoid being around people who are sick. This medicine may increase your risk to bruise or bleed. Call your doctor orhealth care professional if you notice any unusual bleeding. Be careful brushing and flossing your teeth or using a toothpick because you may get an infection or bleed more easily. If you have any dental work done,tell your dentist you are receiving this medicine. Avoid taking products that contain aspirin, acetaminophen, ibuprofen, naproxen, or  ketoprofen unless instructed by your doctor. These medicines may hide afever. Do not become pregnant while taking this medicine. Women should inform their doctor if they wish to become pregnant or think they might be pregnant. There is a potential for serious side effects to an unborn child. Talk to your health care professional or pharmacist for more information. Do not breast-feed aninfant while taking this medicine. Men are advised not to father a child while receiving this medicine. This product may contain alcohol. Ask your pharmacist or healthcare provider if this medicine contains alcohol. Be sure to tell all healthcare providers you are taking this medicine. Certain medicines, like metronidazole and disulfiram, can cause an unpleasant reaction when taken with alcohol. The reaction includes flushing, headache, nausea, vomiting, sweating, and increased thirst. Thereaction can last from 30 minutes to several hours. What side effects may I notice from receiving this medication? Side effects that you should report to your doctor or health care professionalas soon as possible: allergic reactions like skin rash, itching or hives, swelling of the face, lips, or tongue breathing problems changes in vision fast, irregular heartbeat high or low blood pressure mouth sores pain, tingling, numbness in the hands  or feet signs of decreased platelets or bleeding - bruising, pinpoint red spots on the skin, black, tarry stools, blood in the urine signs of decreased red blood cells - unusually weak or tired, feeling faint or lightheaded, falls signs of infection - fever or chills, cough, sore throat, pain or difficulty passing urine signs and symptoms of liver injury like dark yellow or brown urine; general ill feeling or flu-like symptoms; light-colored stools; loss of appetite; nausea; right upper belly pain; unusually weak or tired; yellowing of the eyes or skin swelling of the ankles, feet, hands unusually  slow heartbeat Side effects that usually do not require medical attention (report to yourdoctor or health care professional if they continue or are bothersome): diarrhea hair loss loss of appetite muscle or joint pain nausea, vomiting pain, redness, or irritation at site where injected tiredness This list may not describe all possible side effects. Call your doctor for medical advice about side effects. You may report side effects to FDA at1-800-FDA-1088. Where should I keep my medication? This drug is given in a hospital or clinic and will not be stored at home. NOTE: This sheet is a summary. It may not cover all possible information. If you have questions about this medicine, talk to your doctor, pharmacist, orhealth care provider.  2022 Elsevier/Gold Standard (2019-04-27 13:37:23)

## 2020-12-27 NOTE — Progress Notes (Signed)
[facility] NOTE  Patient Care Team: Ria Bush, MD as PCP - General (Family Medicine) [name] [name], MD as PCP - Cardiology (Cardiology) [name], MD (Internal Medicine) Minna Merritts, MD as Consulting Physician (Cardiology) Pieter Partridge, DO as Consulting Physician (Neurology) Cammie Sickle, MD as Consulting Physician (Hematology and Oncology) Telford Nab, RN as Oncology Nurse Navigator  CHIEF COMPLAINTS/PURPOSE OF CONSULTATION: lung cancer    Oncology History Overview Note  #MAY 2022-Lung cancer-non-small cell [CT guided bx] T3N1 vs stage IV Dr.Hendrickson.  MRI brain negative for malignancy.# 1. April 2022- LLL ~4.0 cm mass in the superior segment left lower lobe abuts the major fissure and the posterior pleural surface without visible chest wall invasion without pleural effusion. There 2-3 other small nodules in the left lower lobe, largest measures 9 mm in diameter. These are suspicious for same lobe satellite lesions and there is left hilar adenopathy. Assuming non-small cell lung cancer the appearance is compatible with T3 N1 M0 disease (stage IIIA).  # right suprarenal nodule-awaiting biopsy on 5/31- non-small cell [revived at tumor conference at [facility]  # June 9th 2022- CARBO-TAXOl-KEYTRUDA; Fulphila  MOLECULAR TESTING: NGS TPS-PDL 100%; EXON 12 amplification* mutations.    # CAD [CABG 2016; Dr.Gollan]   Cancer of lower lobe of left lung ([place])  11/02/2020 Initial Diagnosis   Cancer of lower lobe of left lung ([place])   11/02/2020 Cancer Staging   Staging form: Lung, AJCC 8th Edition - Clinical: Stage IVA (cT3, cN1, cM1a) - Signed by Cammie Sickle, MD on 11/02/2020    11/16/2020 -  Chemotherapy    Patient is on Treatment Plan: LUNG NSCLC CARBOPLATIN + PACLITAXEL + PEMBROLIZUMAB Q21D X 4 CYCLES / PEMBROLIZUMAB MAINTENANCE Q21D          HISTORY OF PRESENTING ILLNESS:  Randall Ali. 80 y.o.  male lung  cancer-non-small cell stage IV [supra-renal nodule s/p biopsy] currently on CarboTaxol Keytruda status post cycle #2 is here for follow-up.  In the interim patient was evaluated by vascular had ultrasound of the lower extremity-as per patient he was recommended to have a stent placed.   Patient complains of mild fatigue.  Denies any nausea vomiting.  Denies any chest pain.  No diarrhea.  No headache.  He continues to go to the gym; however he feels tired easily.  Patient noted to have mild rash on his nape of the neck/torso.  Mildly itchy.  Not improved with hydrocortisone.  Review of Systems  Constitutional:  Positive for malaise/fatigue. Negative for chills, diaphoresis, fever and weight loss.  HENT:  Negative for nosebleeds and sore throat.   Eyes:  Negative for double vision.  Respiratory:  Negative for cough, hemoptysis, sputum production, shortness of breath and wheezing.   Cardiovascular:  Negative for chest pain, palpitations, orthopnea and leg swelling.  Gastrointestinal:  Negative for abdominal pain, blood in stool, constipation, diarrhea, heartburn, melena, nausea and vomiting.  Genitourinary:  Negative for dysuria, frequency and urgency.  Musculoskeletal:  Positive for back pain and joint pain.  Skin: Negative.  Negative for itching and rash.  Neurological:  Negative for dizziness, tingling, focal weakness, weakness and headaches.  Endo/Heme/Allergies:  Does not bruise/bleed easily.  Psychiatric/Behavioral:  Negative for depression. The patient is not nervous/anxious and does not have insomnia.     MEDICAL HISTORY:  Past Medical History:  Diagnosis Date   Allergy    seasonal   Arthritis    all over- in general  CAD (coronary artery disease)    a. inferior wall MI 10/01 s/p PCI/DES to RCA; b. Myoview 3/16 neg for ischemia; c. LHC 8/16: ostLAD 80%, OM1 70%, OM2 70% x 2 lesions, mRCA 30%, dRCA 70% s/p 4-V CABG 01/24/15 (LIMA-LAD, VG- OM1, VG-OM2, VG-PDA)    Cancer (HCC)     skin, melanoma   Carotid artery disease ([place])    a. [place] 8/16: 1-39% bilateral ICA stenosis   Cataract    removed   Diastolic dysfunction    a. TTE 8/16: EF 55-60%, no RWMA, Gr1DD, calcified mitral annulus, mild biatrial enlargement   Erectile dysfunction    GERD (gastroesophageal reflux disease)    History of elbow surgery    History of hiatal hernia    HLD (hyperlipidemia)    HTN (hypertension)    Inferior myocardial infarction ([place]) 03/2000   stent RCA   Lung cancer ([place])    Postoperative wound infection 02/02/2015   Reflux esophagitis    Sleep apnea 2017   CPAP at night    SURGICAL HISTORY: Past Surgical History:  Procedure Laterality Date   arm surgery  2010   BROW LIFT Bilateral 11/25/2019   Procedure: BROW PTOSIS REPAIR BILATERAL;  Surgeon: Karle Starch, MD;  Location: [facility];  Service: Ophthalmology;  Laterality: Bilateral;  sleep apnea   CARDIAC CATHETERIZATION  06/24/11   CARDIAC CATHETERIZATION N/A 01/18/2015   Procedure: Left Heart Cath with coronary angiography;  Surgeon: Minna Merritts, MD;  Location: [facility];  Service: Cardiovascular;  Laterality: N/A;   CARDIAC CATHETERIZATION N/A 01/18/2015   Procedure: Intravascular Pressure Wire/FFR Study;  Surgeon: [name], MD;  Location: [facility];  Service: Cardiovascular;  Laterality: N/A;   CAROTID STENT  03/10/2011   COLONOSCOPY  2010   COLONOSCOPY  06/14/2014   Dr Hilarie Fredrickson   CORONARY ARTERY BYPASS GRAFT N/A 01/24/2015   Procedure: CORONARY ARTERY BYPASS GRAFTING x 4 (LIMA-LAD, SVG-Int 1- Int 2, SVG-PD) ENDOSCOPIC GREATER SAPHENOUS VEIN HARVEST LEFT LEG;  Surgeon: [name], MD;  Location: [facility];  Service: Open Heart Surgery;  Laterality: N/A;   EMBOLECTOMY  06/15/2019   Procedure: EMBOLECTOMY;  Surgeon: Katha Cabal, MD;  Location: [facility];  Service: Vascular;;  right superficial femoral artery   ENDARTERECTOMY FEMORAL Right 06/15/2019   Procedure: ENDARTERECTOMY  FEMORAL;  Surgeon: Katha Cabal, MD;  Location: [facility];  Service: Vascular;  Laterality: Right;  common femoral profunda femoris superficial femoral   ESOPHAGOGASTRODUODENOSCOPY (EGD) WITH PROPOFOL N/A 04/24/2016   Procedure: ESOPHAGOGASTRODUODENOSCOPY (EGD) WITH PROPOFOL;  Surgeon: Jerene Bears, MD;  Location: [facility];  Service: Gastroenterology;  Laterality: N/A;   EYE SURGERY     lasik 15 yrs. ago, cataracts removed - both eyes    HAMMER TOE SURGERY     right toe   INSERTION OF ILIAC STENT Right 06/15/2019   Procedure: INSERTION OF ILIAC STENT ( STENTING OF SFA/POP ARTERY );  Surgeon: Katha Cabal, MD;  Location: [facility];  Service: Vascular;  Laterality: Right;  angioplpasty and stent placement: right superficial femoral right tibiopopliteal trunk bilateral common iliac arteries   IR IMAGING GUIDED PORT INSERTION  11/09/2020   LEFT HEART CATH AND CORONARY ANGIOGRAPHY Left 06/10/2017   Procedure: LEFT HEART CATH AND CORONARY ANGIOGRAPHY;  Surgeon: Minna Merritts, MD;  Location: [facility];  Service: Cardiovascular;  Laterality: Left;   LOWER EXTREMITY ANGIOGRAPHY Left 01/04/2019   Procedure: LOWER EXTREMITY ANGIOGRAPHY;  Surgeon:  Schnier, Dolores Lory, MD;  Location: [facility];  Service: Cardiovascular;  Laterality: Left;   LOWER EXTREMITY ANGIOGRAPHY Right 01/25/2019   Procedure: LOWER EXTREMITY ANGIOGRAPHY;  Surgeon: Katha Cabal, MD;  Location: [facility];  Service: Cardiovascular;  Laterality: Right;   NASAL SINUS SURGERY  2008   septpolasty, bilateral turbinate reduction   SHOULDER ARTHROSCOPY  2012   TEE WITHOUT CARDIOVERSION N/A 01/24/2015   Procedure: TRANSESOPHAGEAL ECHOCARDIOGRAM (TEE);  Surgeon: [name], MD;  Location: [facility];  Service: Open Heart Surgery;  Laterality: N/A;   TOE SURGERY  1994   UPPER GI ENDOSCOPY  07/2014, 04-24-16   Dr Raquel James   WRIST SURGERY  2011    SOCIAL HISTORY: Social History    Socioeconomic History   Marital status: Single    Spouse name: Not on file   Number of children: Not on file   Years of education: 12   Highest education level: Not on file  Occupational History   Occupation: retired    Comment: [employer]  Tobacco Use   Smoking status: Former    Packs/day: 2.50    Years: 40.00    Pack years: 100.00    Types: Cigarettes    Quit date: 03/24/2000    Years since quitting: 20.7   Smokeless tobacco: Never  Vaping Use   Vaping Use: Never used  Substance and Sexual Activity   Alcohol use: Yes    Comment: occassionally   Drug use: No   Sexual activity: Yes  Other Topics Concern   Not on file  Social History Narrative   Singled; lives with son and dog    Occ: retired, back part time at [employer];    Activity: gym 4-5d/wk   Diet: good water, fruits/vegetables daily   Caffeine Use-yes      ------------------------------------       Car sales- retd; [employer]- retd; quit smoking 2001. Alcohol couple nights a week. Live in [place]. Daughter lives 10 mins.    Social Determinants of Health   Financial Resource Strain: Low Risk    Difficulty of Paying Living Expenses: Not hard at all  Food Insecurity: No Food Insecurity   Worried About Charity fundraiser in the Last Year: Never true   [place] in the Last Year: Never true  Transportation Needs: No Transportation Needs   Lack of Transportation (Medical): No   Lack of Transportation (Non-Medical): No  Physical Activity: Sufficiently Active   Days of Exercise per Week: 7 days   Minutes of Exercise per Session: 60 min  Stress: No Stress Concern Present   Feeling of Stress : Not at all  Social Connections: Not on file  Intimate Partner Violence: Not At Risk   Fear of Current or Ex-Partner: No   Emotionally Abused: No   Physically Abused: No   Sexually Abused: No    FAMILY HISTORY: Family History  Problem Relation Age of Onset   Hypertension Mother    Heart disease Mother     Hypertension Father    Diabetes Father    Lymphoma Sister    Heart disease Brother 15   Cancer Paternal Grandfather    Colon cancer Neg Hx    Prostate cancer Neg Hx    Bladder Cancer Neg Hx    Kidney cancer Neg Hx     ALLERGIES:  is allergic to flomax [tamsulosin].  MEDICATIONS:  Current Outpatient Medications  Medication Sig Dispense Refill   acetaminophen (  TYLENOL) 325 MG tablet Take by mouth as needed.     Ascorbic Acid (VITAMIN C) 1000 MG tablet Take 1,000 mg by mouth daily.     aspirin 81 MG EC tablet Take 81 mg by mouth daily.       Calcium-Magnesium-Vitamin D (CALCIUM 1200+D3 PO) Take 1 tablet by mouth daily.     Carboxymethylcellul-Glycerin (LUBRICATING EYE DROPS OP) Place 1 drop into both eyes daily as needed (irritation).     Cholecalciferol (VITAMIN D3) 50 MCG (2000 UT) TABS Take 2,000 Units by mouth daily.     Cyanocobalamin (B-12) 5000 MCG CAPS Take 5,000 mcg by mouth daily.     docusate sodium (COLACE) 100 MG capsule Take 100 mg by mouth at bedtime.     lidocaine-prilocaine (EMLA) cream Apply 30 -45 mins prior to port access. 30 g 0   lisinopril (ZESTRIL) 5 MG tablet TAKE 1 TABLET BY MOUTH DAILY 90 tablet 3   loratadine (CLARITIN) 10 MG tablet Take 10 mg by mouth daily.     Melatonin 10 MG CAPS Take 10 mg by mouth at bedtime as needed (sleep).     metoprolol succinate (TOPROL-XL) 25 MG 24 hr tablet TAKE 1/2 TABLET (12.5 MG) BY MOUTH DAILY 45 tablet 3   Niacinamide-Zn-Cu-Methfo-Se-Cr (NICOTINAMIDE PO) Take 500 mg by mouth daily.     nitroGLYCERIN (NITROSTAT) 0.4 MG SL tablet Place 1 tablet (0.4 mg total) under the tongue every 5 (five) minutes as needed for up to 25 doses for chest pain. 25 tablet 1   NONFORMULARY OR COMPOUNDED ITEM Trimix (30/1/10)-(Pap/Phent/PGE)  Test Dose 3 56ml vials  Qty #3 Refills 0  [facility] [phone] Fax [phone] 3 each 0   Polyethyl Glycol-Propyl Glycol (LUBRICANT EYE DROPS) 0.4-0.3 % SOLN Place 1-2 drops into both eyes  3 (three) times daily as needed (burning eyes.).     simvastatin (ZOCOR) 40 MG tablet TAKE 1 TABLET BY MOUTH AT BEDTIME 90 tablet 0   traMADol (ULTRAM) 50 MG tablet Take 1 tablet (50 mg total) by mouth 3 (three) times daily as needed for moderate pain. 90 tablet 0   triamcinolone ointment (KENALOG) 0.5 % Apply 1 application topically 2 (two) times daily. 30 g 0   Vitamin E 450 MG (1000 UT) CAPS Take 1,000 Units by mouth daily.     Zinc 50 MG TABS Take 50 mg by mouth daily.     ondansetron (ZOFRAN) 8 MG tablet One pill every 8 hours as needed for nausea/vomitting. (Patient not taking: Reported on 12/27/2020) 40 tablet 1   pantoprazole (PROTONIX) 40 MG tablet TAKE 1 TABLET BY MOUTH DAILY 90 tablet 3   prochlorperazine (COMPAZINE) 10 MG tablet Take 1 tablet (10 mg total) by mouth every 6 (six) hours as needed for nausea or vomiting. (Patient not taking: Reported on 12/27/2020) 40 tablet 1   No current facility-administered medications for this visit.      [name]  PHYSICAL EXAMINATION: ECOG PERFORMANCE STATUS: 0 - Asymptomatic  Vitals:   12/27/20 0847  BP: 124/70  Pulse: 66  Resp: 17  Temp: 98 F (36.7 C)  SpO2: 95%    Filed Weights   12/27/20 0847  Weight: 209 lb (94.8 kg)     Physical Exam Constitutional:      Comments: Ambulatory independently; accompanied by his daughter  HENT:     Head: Normocephalic and atraumatic.     Mouth/Throat:     Pharynx: No oropharyngeal exudate.  Eyes:     Pupils: Pupils  are equal, round, and reactive to light.  Cardiovascular:     Rate and Rhythm: Normal rate and regular rhythm.  Pulmonary:     Effort: No respiratory distress.     Breath sounds: No wheezing.  Abdominal:     General: Bowel sounds are normal. There is no distension.     Palpations: Abdomen is soft. There is no mass.     Tenderness: There is no abdominal tenderness. There is no guarding or rebound.  Musculoskeletal:        General: No tenderness. Normal range of motion.      Cervical back: Normal range of motion and neck supple.  Skin:    General: Skin is warm.  Neurological:     Mental Status: He is alert and oriented to person, place, and time.  Psychiatric:        Mood and Affect: Affect normal.     LABORATORY DATA:  I have reviewed the data as listed Lab Results  Component Value Date   WBC 6.9 12/27/2020   HGB 13.1 12/27/2020   HCT 39.3 12/27/2020   MCV 92.7 12/27/2020   PLT 186 12/27/2020   Recent Labs    11/15/20 1232 11/26/20 1107 12/06/20 0754 12/17/20 1057 12/27/20 0801  NA 135   < > 137 134* 138  K 4.3   < > 4.3 4.3 4.2  CL 102   < > 104 103 107  CO2 23   < > 26 23 23   GLUCOSE 100*   < > 103* 103* 103*  BUN 24*   < > 19 22 21   CREATININE 0.90   < > 0.96 1.04 0.99  CALCIUM 8.9   < > 9.5 9.0 9.2  GFRNONAA >60   < > >60 >60 >60  PROT 7.1  --  7.3  --  6.8  ALBUMIN 3.9  --  4.0  --  4.2  AST 12*  --  16  --  15  ALT 13  --  17  --  14  ALKPHOS 60  --  72  --  68  BILITOT 0.3  --  0.8  --  0.7   < > = values in this interval not displayed.    RADIOGRAPHIC STUDIES: I have personally reviewed the radiological images as listed and agreed with the findings in the report. VAS [place] ABI WITH/WO TBI  Result Date: 12/27/2020  LOWER EXTREMITY DOPPLER STUDY Patient Name:  Randall Ali.  Date of Exam:   12/26/2020 Medical Rec #: [id number]            Accession #:    [id number] Date of Birth: 1940/08/05            Patient Gender: M Patient Age:   3Y Exam Location:  [facility] Procedure:      VAS [place] ABI WITH/WO TBI Referring Phys: [id number] [place] --------------------------------------------------------------------------------  Indications: Peripheral artery disease, and worsening right calf pain. Other Factors: S/P Interventions.  Vascular Interventions: Bilat CIA/EIA stents; right sfa pop tp trunk stents. Performing Technologist: Concha Norway RVT  Examination Guidelines: A complete evaluation includes at minimum, Doppler  waveform signals and systolic blood pressure reading at the level of bilateral brachial, anterior tibial, and posterior tibial arteries, when vessel segments are accessible. Bilateral testing is considered an integral part of a complete examination. Photoelectric Plethysmograph (PPG) waveforms and toe systolic pressure readings are included as required and additional duplex testing as needed. Limited examinations for  reoccurring indications may be performed as noted.  ABI Findings: +---------+------------------+-----+----------+--------+ Right    Rt Pressure (mmHg)IndexWaveform  Comment  +---------+------------------+-----+----------+--------+ Brachial 135                                       +---------+------------------+-----+----------+--------+ ATA      69                0.51 monophasic         +---------+------------------+-----+----------+--------+ PTA      64                0.47 monophasic         +---------+------------------+-----+----------+--------+ Great Toe54                0.40 Abnormal           +---------+------------------+-----+----------+--------+ +---------+------------------+-----+--------+-------+ Left     Lt Pressure (mmHg)IndexWaveformComment +---------+------------------+-----+--------+-------+ Brachial 135                                    +---------+------------------+-----+--------+-------+ ATA      130               0.96 biphasic        +---------+------------------+-----+--------+-------+ PTA      131               0.97 biphasic        +---------+------------------+-----+--------+-------+ Great Toe116               0.86 Normal          +---------+------------------+-----+--------+-------+ +-------+-----------+-----------+------------+------------+ ABI/TBIToday's ABIToday's TBIPrevious ABIPrevious TBI +-------+-----------+-----------+------------+------------+ Right  .51        .40        .81         .50           +-------+-----------+-----------+------------+------------+ Left   .96        .86        .93         1.07         +-------+-----------+-----------+------------+------------+ Compared to prior study on 08/09/2020.  Summary: Right: Resting right ankle-brachial index indicates moderate right lower extremity arterial disease. The right toe-brachial index is abnormal. Left: Resting left ankle-brachial index is within normal range. No evidence of significant left lower extremity arterial disease. The left toe-brachial index is normal.  *See table(s) above for measurements and observations.  Electronically signed by Hortencia Pilar MD on 12/27/2020 at 5:07:04 PM.    Final    VAS [place] LOWER EXTREMITY ARTERIAL DUPLEX  Result Date: 12/27/2020 LOWER EXTREMITY ARTERIAL DUPLEX STUDY Patient Name:  Randall Ali.  Date of Exam:   12/26/2020 Medical Rec #: [id number]            Accession #:    [id number] Date of Birth: [birth year]            Patient Gender: M Patient Age:   37Y Exam Location:  [facility] Procedure:      VAS [place] LOWER EXTREMITY ARTERIAL DUPLEX Referring Phys: Dolores Lory SCHNIER --------------------------------------------------------------------------------  Indications: Peripheral artery disease, and Worsening right leg calf              claudication > than 4 months ago.  Vascular Interventions: Bilat CIA/EIA stents. Right SFA/POP/TP trunk stents. Current ABI:  rt = .64 Comparison Study: 08/09/2020 Performing Technologist: Concha Norway RVT  Examination Guidelines: A complete evaluation includes B-mode imaging, spectral Doppler, color Doppler, and power Doppler as needed of all accessible portions of each vessel. Bilateral testing is considered an integral part of a complete examination. Limited examinations for reoccurring indications may be performed as noted.  +-----------+--------+-----+--------------+------------------+-----------------+ RIGHT      PSV cm/sRatioStenosis       Waveform          Comments          +-----------+--------+-----+--------------+------------------+-----------------+ CFA Mid    180                        monophasic                          +-----------+--------+-----+--------------+------------------+-----------------+ DFA        121                        monophasic                          +-----------+--------+-----+--------------+------------------+-----------------+ SFA Prox   61                         monophasic                          +-----------+--------+-----+--------------+------------------+-----------------+ SFA Mid    435          75-99%        monophasic        7.1 ratio; stent                          stenosis                        end stenosis      +-----------+--------+-----+--------------+------------------+-----------------+ SFA Distal 29                         dampened                                                                  monophasic                          +-----------+--------+-----+--------------+------------------+-----------------+ POP Distal 41                         monophasic                          +-----------+--------+-----+--------------+------------------+-----------------+ ATA Distal 7                          monophasic        near occlusion    +-----------+--------+-----+--------------+------------------+-----------------+ PTA Distal 43                         monophasic                          +-----------+--------+-----+--------------+------------------+-----------------+  PERO Distal46                         monophasic                          +-----------+--------+-----+--------------+------------------+-----------------+   Summary: Right: 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 This finding was seen previously but has worsened with a pre-stenotic velocity of 61  to a stenotic lesion velocity of 435 suggesting critical stenosis.  See table(s) above for measurements and observations. Electronically signed by Hortencia Pilar MD on 12/27/2020 at 5:07:08 PM.    Final      ASSESSMENT & PLAN:   Cancer of lower lobe of left lung ([place]) #Lung cancer-non-small cell stage IV [right suprarenal nodule-s/p biopsy "non-small cell ca"].  Currently on CarboTaxol plus Keytruda.  #Proceed with cycle #3-CarboTaxol Keytruda today. Labs today reviewed;  acceptable for treatment today.  Plan to get imaging after this cycle.  # CAD-s/p CABG- STABLE; no decompensation   # PVD- noted however given claudication pain s/p s/p follow-up with vascular; as per patient-he was recommended a stent [Dr.Schneir]-discussed that we will be happy to coordinate with vascular surgery given ongoing therapy.  # Skin rash- back/torso- G-1-2-itching- not improved on hydrocortisone; add kenalog. Benadryl 25 mg PO qhs.   # DISPOSITION: D-2 fulphila as planned.  #  chemo today # labs in 10 days- cbc/bmp # follow up in 3 weeks- MD; labs- cbc/cmp;  [carbo-taxol-keytruda]; d-2; fuphila; CT  Prior- Dr.B  Cc; Dr.Schnier.    All  questions were answered. The patient knows to call the clinic with any problems, questions or concerns.    Cammie Sickle, MD 12/30/2020 5:07 PM

## 2020-12-28 ENCOUNTER — Other Ambulatory Visit: Payer: Self-pay | Admitting: Family Medicine

## 2020-12-28 ENCOUNTER — Telehealth: Payer: Self-pay | Admitting: Oncology

## 2020-12-28 ENCOUNTER — Inpatient Hospital Stay: Payer: PPO

## 2020-12-28 NOTE — Telephone Encounter (Signed)
Re: missed appointment   Randall Ali had chemotherapy treatment yesterday and overslept this morning for his on pro Neulasta injection.  CBC    Component Value Date/Time   WBC 6.9 12/27/2020 0801   RBC 4.24 12/27/2020 0801   HGB 13.1 12/27/2020 0801   HGB 14.8 01/15/2015 0956   HCT 39.3 12/27/2020 0801   HCT 44.2 01/15/2015 0956   PLT 186 12/27/2020 0801   PLT 207 01/15/2015 0956   MCV 92.7 12/27/2020 0801   MCV 91 01/15/2015 0956   MCH 30.9 12/27/2020 0801   MCHC 33.3 12/27/2020 0801   RDW 14.6 12/27/2020 0801   RDW 12.9 01/15/2015 0956   LYMPHSABS 1.1 12/27/2020 0801   LYMPHSABS 1.9 01/15/2015 0956   MONOABS 0.8 12/27/2020 0801   EOSABS 0.0 12/27/2020 0801   EOSABS 0.2 01/15/2015 0956   BASOSABS 0.1 12/27/2020 0801   BASOSABS 0.0 01/15/2015 0956    White count was 6.9 with ANC of 7500.  Will touch base on Monday to see about possibly getting on pro neulasta. Insurance may not approve.   Faythe Casa, NP 12/28/2020 6:14 PM

## 2021-01-02 ENCOUNTER — Telehealth (INDEPENDENT_AMBULATORY_CARE_PROVIDER_SITE_OTHER): Payer: Self-pay

## 2021-01-02 NOTE — Telephone Encounter (Signed)
Patient has been informed that his RLE angio with Dr. Delana Meyer on 01/08/21 will be moved to 01/15/21 with a 6:45 am arrival time to the [facility]. Pre-procedure instructions will be mailed.

## 2021-01-02 NOTE — Telephone Encounter (Signed)
Spoke with the patient and he is scheduled with Dr. Delana Meyer for a RLE angio on 01/08/21 with a 9:00 am arrival time to the [facility]. Pre-procedure instructions were discussed and will be mailed.

## 2021-01-04 ENCOUNTER — Other Ambulatory Visit: Payer: Self-pay

## 2021-01-04 ENCOUNTER — Other Ambulatory Visit: Payer: Self-pay | Admitting: Family Medicine

## 2021-01-04 DIAGNOSIS — C3432 Malignant neoplasm of lower lobe, left bronchus or lung: Secondary | ICD-10-CM

## 2021-01-04 NOTE — Telephone Encounter (Signed)
Name of Medication: Tramadol Name of Pharmacy: [facility] or Written Date and Quantity: 12/07/20, #90 Last Office Visit and Type: 03/23/20, AWV prt 2 Next Office Visit and Type: 04/03/21, AWV Last Controlled Substance Agreement Date: 01/18/16 Last UDS: 01/18/16

## 2021-01-05 ENCOUNTER — Encounter (INDEPENDENT_AMBULATORY_CARE_PROVIDER_SITE_OTHER): Payer: Self-pay | Admitting: Nurse Practitioner

## 2021-01-05 NOTE — Progress Notes (Signed)
Subjective:    Patient ID: Randall Lofts., male    DOB: 09/25/1940, 80 y.o.   MRN: [id number] Chief Complaint  Patient presents with   Follow-up    Ultrasound follow up    Randall Ali is a 80 year old male returns to the office for followup and review of the noninvasive studies. There has been a significant deterioration in the lower extremity symptoms.  The patient notes interval shortening of their claudication distance and development of mild rest pain symptoms. No new ulcers or wounds have occurred since the last visit.  He has been recently diagnosed with lung cancer and has been undergoing treatment  The patient denies amaurosis fugax or recent TIA symptoms. There are no recent neurological changes noted. The patient denies history of DVT, PE or superficial thrombophlebitis. The patient denies recent episodes of angina or shortness of breath.   ABI's Rt= 0 .51 and Lt= 0.96 (previous ABI's Rt= 0.81 and Lt= 0.93) Duplex US of the lower extremity arterial system shows biphasic tibial artery waveforms in the left.  The left has good toe waveforms.  The right lower extremity at a lower extremity arterial duplex which shows a 75 to 99% stenosis in the mid SFA.  The toe waveforms are dampened.   Review of Systems  Cardiovascular:        Claudication  All other systems reviewed and are negative.     Objective:   Physical Exam Vitals reviewed.  HENT:     Head: Normocephalic.  Cardiovascular:     Rate and Rhythm: Normal rate.     Pulses:          Dorsalis pedis pulses are 0 on the right side and 1+ on the left side.  Pulmonary:     Effort: Pulmonary effort is normal.  Skin:    General: Skin is warm and dry.  Neurological:     Mental Status: He is alert and oriented to person, place, and time.  Psychiatric:        Mood and Affect: Mood normal.        Behavior: Behavior normal.        Thought Content: Thought content normal.        Judgment: Judgment normal.    BP  113/64 (BP Location: Right Arm)   Pulse 76   Resp 16   Wt 210 lb (95.3 kg)   BMI 31.93 kg/m   Past Medical History:  Diagnosis Date   Allergy    seasonal   Arthritis    all over- in general    CAD (coronary artery disease)    a. inferior wall MI 10/01 s/p PCI/DES to RCA; b. Myoview 3/16 neg for ischemia; c. LHC 8/16: ostLAD 80%, OM1 70%, OM2 70% x 2 lesions, mRCA 30%, dRCA 70% s/p 4-V CABG 01/24/15 (LIMA-LAD, VG- OM1, VG-OM2, VG-PDA)    Cancer (HCC)    skin, melanoma   Carotid artery disease ([place])    a. [place] 8/16: 1-39% bilateral ICA stenosis   Cataract    removed   Diastolic dysfunction    a. 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   stent RCA   Lung cancer ([place])    Postoperative  wound infection 02/02/2015   Reflux esophagitis    Sleep apnea 2017   CPAP at night    Social History   Socioeconomic History   Marital status: Single    Spouse name: Not on file   Number of children: Not on file   Years of education: 12   Highest education level: Not on file  Occupational History   Occupation: retired    Comment: [employer]  Tobacco Use   Smoking status: Former    Packs/day: 2.50    Years: 40.00    Pack years: 100.00    Types: Cigarettes    Quit date: 03/24/2000    Years since quitting: 20.8   Smokeless tobacco: Never  Vaping Use   Vaping Use: Never used  Substance and Sexual Activity   Alcohol use: Yes    Comment: occassionally   Drug use: No   Sexual activity: Yes  Other Topics Concern   Not on file  Social History Narrative   Singled; lives with son and dog    Occ: retired, back part time at [employer];    Activity: gym 4-5d/wk   Diet: good water, fruits/vegetables daily   Caffeine Use-yes       ------------------------------------       Car sales- retd; [employer]- retd; quit smoking 2001. Alcohol couple nights a week. Live in [place]. Daughter lives 10 mins.    Social Determinants of Health   Financial Resource Strain: Low Risk    Difficulty of Paying Living Expenses: Not hard at all  Food Insecurity: No Food Insecurity   Worried About Charity fundraiser in the Last Year: Never true   [place] in the Last Year: Never true  Transportation Needs: No Transportation Needs   Lack of Transportation (Medical): No   Lack of Transportation (Non-Medical): No  Physical Activity: Sufficiently Active   Days of Exercise per Week: 7 days   Minutes of Exercise per Session: 60 min  Stress: No Stress Concern Present   Feeling of Stress : Not at all  Social Connections: Not on file  Intimate Partner Violence: Not At Risk   Fear of Current or Ex-Partner: No   Emotionally Abused: No   Physically Abused: No   Sexually Abused: No    Past Surgical History:  Procedure Laterality Date   arm surgery  2010   BROW LIFT Bilateral 11/25/2019   Procedure: [place];  Surgeon: Karle Starch, MD;  Location: [facility];  Service: Ophthalmology;  Laterality: Bilateral;  sleep apnea   CARDIAC CATHETERIZATION  06/24/11   CARDIAC CATHETERIZATION N/A 01/18/2015   Procedure: Left Heart Cath with coronary angiography;  Surgeon: Minna Merritts, MD;  Location: [facility];  Service: Cardiovascular;  Laterality: N/A;   CARDIAC CATHETERIZATION N/A 01/18/2015   Procedure: Intravascular Pressure Wire/FFR Study;  Surgeon: [name], MD;  Location: [facility];  Service: Cardiovascular;  Laterality: N/A;   CAROTID STENT  03/10/2011   COLONOSCOPY  2010   COLONOSCOPY  06/14/2014   Dr Hilarie Fredrickson   CORONARY ARTERY BYPASS GRAFT N/A 01/24/2015   Procedure: CORONARY ARTERY BYPASS GRAFTING x 4 (LIMA-LAD, SVG-Int 1- Int 2, SVG-PD) ENDOSCOPIC GREATER SAPHENOUS VEIN  HARVEST LEFT LEG;  Surgeon: [name], MD;  Location: [facility];  Service: Open Heart Surgery;  Laterality: N/A;   EMBOLECTOMY  06/15/2019   Procedure: EMBOLECTOMY;  Surgeon: Katha Cabal, MD;  Location: [facility];  Service:  Vascular;;  right superficial femoral artery   ENDARTERECTOMY FEMORAL Right 06/15/2019   Procedure: ENDARTERECTOMY FEMORAL;  Surgeon: Katha Cabal, MD;  Location: [facility];  Service: Vascular;  Laterality: Right;  common femoral profunda femoris superficial femoral   ESOPHAGOGASTRODUODENOSCOPY (EGD) WITH PROPOFOL N/A 04/24/2016   Procedure: ESOPHAGOGASTRODUODENOSCOPY (EGD) WITH PROPOFOL;  Surgeon: Jerene Bears, MD;  Location: [facility];  Service: Gastroenterology;  Laterality: N/A;   EYE SURGERY     lasik 15 yrs. ago, cataracts removed - both eyes    HAMMER TOE SURGERY     right toe   INSERTION OF ILIAC STENT Right 06/15/2019   Procedure: INSERTION OF ILIAC STENT ( STENTING OF SFA/POP ARTERY );  Surgeon: Katha Cabal, MD;  Location: [facility];  Service: Vascular;  Laterality: Right;  angioplpasty and stent placement: right superficial femoral right tibiopopliteal trunk bilateral common iliac arteries   IR IMAGING GUIDED PORT INSERTION  11/09/2020   LEFT HEART CATH AND CORONARY ANGIOGRAPHY Left 06/10/2017   Procedure: LEFT HEART CATH AND CORONARY ANGIOGRAPHY;  Surgeon: Minna Merritts, MD;  Location: [facility];  Service: Cardiovascular;  Laterality: Left;   LOWER EXTREMITY ANGIOGRAPHY Left 01/04/2019   Procedure: LOWER EXTREMITY ANGIOGRAPHY;  Surgeon: Katha Cabal, MD;  Location: [facility];  Service: Cardiovascular;  Laterality: Left;   LOWER EXTREMITY ANGIOGRAPHY Right 01/25/2019   Procedure: LOWER EXTREMITY ANGIOGRAPHY;  Surgeon: Katha Cabal, MD;  Location: [facility];  Service: Cardiovascular;  Laterality: Right;   NASAL SINUS SURGERY  2008   septpolasty, bilateral turbinate reduction   SHOULDER ARTHROSCOPY   2012   TEE WITHOUT CARDIOVERSION N/A 01/24/2015   Procedure: TRANSESOPHAGEAL ECHOCARDIOGRAM (TEE);  Surgeon: [name], MD;  Location: [facility];  Service: Open Heart Surgery;  Laterality: N/A;   TOE SURGERY  1994   UPPER GI ENDOSCOPY  07/2014, 04-24-16   Dr Raquel James   WRIST SURGERY  2011    Family History  Problem Relation Age of Onset   Hypertension Mother    Heart disease Mother    Hypertension Father    Diabetes Father    Lymphoma Sister    Heart disease Brother 93   Cancer Paternal Grandfather    Colon cancer Neg Hx    Prostate cancer Neg Hx    Bladder Cancer Neg Hx    Kidney cancer Neg Hx     Allergies  Allergen Reactions   Flomax [Tamsulosin] Other (See Comments)    Orthostatic dizziness    CBC Latest Ref Rng & Units 12/27/2020 12/17/2020 12/06/2020  WBC 4.0 - 10.5 K/uL 6.9 33.3(H) 10.2  Hemoglobin 13.0 - 17.0 g/dL 13.1 13.1 14.0  Hematocrit 39.0 - 52.0 % 39.3 39.2 41.7  Platelets 150 - 400 K/uL 186 190 282      CMP     Component Value Date/Time   NA 138 12/27/2020 0801   NA 138 01/15/2015 0956   K 4.2 12/27/2020 0801   CL 107 12/27/2020 0801   CO2 23 12/27/2020 0801   GLUCOSE 103 (H) 12/27/2020 0801   BUN 21 12/27/2020 0801   BUN 16 01/15/2015 0956   CREATININE 0.99 12/27/2020 0801   CALCIUM 9.2 12/27/2020 0801   PROT 6.8 12/27/2020 0801   ALBUMIN 4.2 12/27/2020 0801   AST 15 12/27/2020 0801   ALT 14 12/27/2020 0801   ALKPHOS 68 12/27/2020 0801   BILITOT 0.7 12/27/2020 0801   GFRNONAA >60 12/27/2020 0801   GFRAA >60 06/16/2019  1126     VAS [place] ABI WITH/WO TBI  Result Date: 12/27/2020  LOWER EXTREMITY DOPPLER STUDY Patient Name:  BILLIE INTRIAGO.  Date of Exam:   12/26/2020 Medical Rec #: [id number]            Accession #:    [id number] Date of Birth: [birth year]            Patient Gender: M Patient Age:   65Y Exam Location:  [facility] Procedure:      VAS [place] ABI WITH/WO TBI Referring Phys: [id number] [place]  --------------------------------------------------------------------------------  Indications: Peripheral artery disease, and worsening right calf pain. Other Factors: S/P Interventions.  Vascular Interventions: Bilat CIA/EIA stents; right sfa pop tp trunk stents. Performing Technologist: Concha Norway RVT  Examination Guidelines: A complete evaluation includes at minimum, Doppler waveform signals and systolic blood pressure reading at the level of bilateral brachial, anterior tibial, and posterior tibial arteries, when vessel segments are accessible. Bilateral testing is considered an integral part of a complete examination. Photoelectric Plethysmograph (PPG) waveforms and toe systolic pressure readings are included as required and additional duplex testing as needed. Limited examinations for reoccurring indications may be performed as noted.  ABI Findings: +---------+------------------+-----+----------+--------+ Right    Rt Pressure (mmHg)IndexWaveform  Comment  +---------+------------------+-----+----------+--------+ Brachial 135                                       +---------+------------------+-----+----------+--------+ ATA      69                0.51 monophasic         +---------+------------------+-----+----------+--------+ PTA      64                0.47 monophasic         +---------+------------------+-----+----------+--------+ Great Toe54                0.40 Abnormal           +---------+------------------+-----+----------+--------+ +---------+------------------+-----+--------+-------+ Left     Lt Pressure (mmHg)IndexWaveformComment +---------+------------------+-----+--------+-------+ Brachial 135                                    +---------+------------------+-----+--------+-------+ ATA      130               0.96 biphasic        +---------+------------------+-----+--------+-------+ PTA      131               0.97 biphasic         +---------+------------------+-----+--------+-------+ Great Toe116               0.86 Normal          +---------+------------------+-----+--------+-------+ +-------+-----------+-----------+------------+------------+ ABI/TBIToday's ABIToday's TBIPrevious ABIPrevious TBI +-------+-----------+-----------+------------+------------+ Right  .51        .40        .81         .50          +-------+-----------+-----------+------------+------------+ Left   .96        .86        .93         1.07         +-------+-----------+-----------+------------+------------+ Compared to prior study on 08/09/2020.  Summary: Right: Resting right ankle-brachial index indicates moderate right lower extremity  arterial disease. The right toe-brachial index is abnormal. Left: Resting left ankle-brachial index is within normal range. No evidence of significant left lower extremity arterial disease. The left toe-brachial index is normal.  *See table(s) above for measurements and observations.  Electronically signed by Hortencia Pilar MD on 12/27/2020 at 5:07:04 PM.    Final        Assessment & Plan:   1. Atherosclerosis of native artery of both lower extremities with intermittent claudication (HCC) Recommend:  The patient has experienced increased symptoms and is now describing lifestyle limiting claudication and mild rest pain.   Given the severity of the patient's lower extremity symptoms the patient should undergo angiography and intervention.  Risk and benefits were reviewed the patient.  Indications for the procedure were reviewed.  All questions were answered, the patient agrees to proceed.   The patient should continue walking and begin a more formal exercise program.  The patient should continue antiplatelet therapy and aggressive treatment of the lipid abnormalities  The patient will follow up with me after the angiogram.    2. Primary hypertension Continue antihypertensive medications as already  ordered, these medications have been reviewed and there are no changes at this time.   3. 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  We will reach out to his oncologist, Dr. Rogue Bussing to find the best possible timeframe for intervention.   Current Outpatient Medications on File Prior to Visit  Medication Sig Dispense Refill   acetaminophen (TYLENOL) 325 MG tablet Take by mouth as needed.     Ascorbic Acid (VITAMIN C) 1000 MG tablet Take 1,000 mg by mouth daily.     aspirin 81 MG EC tablet Take 81 mg by mouth daily.       Calcium-Magnesium-Vitamin D (CALCIUM 1200+D3 PO) Take 1 tablet by mouth daily.     Carboxymethylcellul-Glycerin (LUBRICATING EYE DROPS OP) Place 1 drop into both eyes daily as needed (irritation).     Cholecalciferol (VITAMIN D3) 50 MCG (2000 UT) TABS Take 2,000 Units by mouth daily.     Cyanocobalamin (B-12) 5000 MCG CAPS Take 5,000 mcg by mouth daily.     docusate sodium (COLACE) 100 MG capsule Take 100 mg by mouth at bedtime.     lidocaine-prilocaine (EMLA) cream Apply 30 -45 mins prior to port access. 30 g 0   lisinopril (ZESTRIL) 5 MG tablet TAKE 1 TABLET BY MOUTH DAILY 90 tablet 3   Melatonin 10 MG CAPS Take 10 mg by mouth at bedtime as needed (sleep).     metoprolol succinate (TOPROL-XL) 25 MG 24 hr tablet TAKE 1/2 TABLET (12.5 MG) BY MOUTH DAILY 45 tablet 3   Niacinamide-Zn-Cu-Methfo-Se-Cr (NICOTINAMIDE PO) Take 500 mg by mouth daily.     nitroGLYCERIN (NITROSTAT) 0.4 MG SL tablet Place 1 tablet (0.4 mg total) under the tongue every 5 (five) minutes as needed for up to 25 doses for chest pain. 25 tablet 1   NONFORMULARY OR COMPOUNDED ITEM Trimix (30/1/10)-(Pap/Phent/PGE)  Test Dose 3 53ml vials  Qty #3 [place] [phone] Fax [phone] 3 each 0   ondansetron (ZOFRAN) 8 MG tablet One pill every 8 hours as needed for nausea/vomitting. (Patient not taking: Reported on 12/27/2020) 40 tablet  1   Polyethyl Glycol-Propyl Glycol (LUBRICANT EYE DROPS) 0.4-0.3 % SOLN Place 1-2 drops into both eyes 3 (three) times daily as needed (burning eyes.).     prochlorperazine (COMPAZINE) 10 MG tablet Take 1  tablet (10 mg total) by mouth every 6 (six) hours as needed for nausea or vomiting. (Patient not taking: Reported on 12/27/2020) 40 tablet 1   simvastatin (ZOCOR) 40 MG tablet TAKE 1 TABLET BY MOUTH AT BEDTIME 90 tablet 0   traMADol (ULTRAM) 50 MG tablet Take 1 tablet (50 mg total) by mouth 3 (three) times daily as needed for moderate pain. 90 tablet 0   Vitamin E 450 MG (1000 UT) CAPS Take 1,000 Units by mouth daily.     Zinc 50 MG TABS Take 50 mg by mouth daily.     No current facility-administered medications on file prior to visit.    There are no Patient Instructions on file for this visit. No follow-ups on file.   Kris Hartmann, NP

## 2021-01-05 NOTE — H&P (View-Only) (Signed)
Subjective:    Patient ID: [name], male    DOB: Dec 30, 1940, 80 y.o.   MRN: [id number] Chief Complaint  Patient presents with   Follow-up    Ultrasound follow up    Randall Ali is a 80 year old male returns to the office for followup and review of the noninvasive studies. There has been a significant deterioration in the lower extremity symptoms.  The patient notes interval shortening of their claudication distance and development of mild rest pain symptoms. No new ulcers or wounds have occurred since the last visit.  He has been recently diagnosed with lung cancer and has been undergoing treatment  The patient denies amaurosis fugax or recent TIA symptoms. There are no recent neurological changes noted. The patient denies history of DVT, PE or superficial thrombophlebitis. The patient denies recent episodes of angina or shortness of breath.   ABI's Rt= 0 .51 and Lt= 0.96 (previous ABI's Rt= 0.81 and Lt= 0.93) Duplex US of the lower extremity arterial system shows biphasic tibial artery waveforms in the left.  The left has good toe waveforms.  The right lower extremity at a lower extremity arterial duplex which shows a 75 to 99% stenosis in the mid SFA.  The toe waveforms are dampened.   Review of Systems  Cardiovascular:        Claudication  All other systems reviewed and are negative.     Objective:   Physical Exam Vitals reviewed.  HENT:     Head: Normocephalic.  Cardiovascular:     Rate and Rhythm: Normal rate.     Pulses:          Dorsalis pedis pulses are 0 on the right side and 1+ on the left side.  Pulmonary:     Effort: Pulmonary effort is normal.  Skin:    General: Skin is warm and dry.  Neurological:     Mental Status: He is alert and oriented to person, place, and time.  Psychiatric:        Mood and Affect: Mood normal.        Behavior: Behavior normal.        Thought Content: Thought content normal.        Judgment: Judgment normal.    BP  113/64 (BP Location: Right Arm)   Pulse 76   Resp 16   Wt 210 lb (95.3 kg)   BMI 31.93 kg/m   Past Medical History:  Diagnosis Date   Allergy    seasonal   Arthritis    all over- in general    CAD (coronary artery disease)    a. inferior wall MI 10/01 s/p PCI/DES to RCA; b. Myoview 3/16 neg for ischemia; c. LHC 8/16: ostLAD 80%, OM1 70%, OM2 70% x 2 lesions, mRCA 30%, dRCA 70% s/p 4-V CABG 01/24/15 (LIMA-LAD, VG- OM1, VG-OM2, VG-PDA)    Cancer (HCC)    skin, melanoma   Carotid artery disease ([place])    a. [place] 8/16: 1-39% bilateral ICA stenosis   Cataract    removed   Diastolic dysfunction    a. 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   stent RCA   Lung cancer ([place])    Postoperative  wound infection 02/02/2015   Reflux esophagitis    Sleep apnea 2017   CPAP at night    Social History   Socioeconomic History   Marital status: Single    Spouse name: Not on file   Number of children: Not on file   Years of education: 12   Highest education level: Not on file  Occupational History   Occupation: retired    Comment: [employer]  Tobacco Use   Smoking status: Former    Packs/day: 2.50    Years: 40.00    Pack years: 100.00    Types: Cigarettes    Quit date: 03/24/2000    Years since quitting: 20.8   Smokeless tobacco: Never  Vaping Use   Vaping Use: Never used  Substance and Sexual Activity   Alcohol use: Yes    Comment: occassionally   Drug use: No   Sexual activity: Yes  Other Topics Concern   Not on file  Social History Narrative   Singled; lives with son and dog    Occ: retired, back part time at [employer];    Activity: gym 4-5d/wk   Diet: good water, fruits/vegetables daily   Caffeine Use-yes       ------------------------------------       Car sales- retd; [employer]- retd; quit smoking 2001. Alcohol couple nights a week. Live in [place]. Daughter lives 10 mins.    Social Determinants of Health   Financial Resource Strain: Low Risk    Difficulty of Paying Living Expenses: Not hard at all  Food Insecurity: No Food Insecurity   Worried About Charity fundraiser in the Last Year: Never true   [place] in the Last Year: Never true  Transportation Needs: No Transportation Needs   Lack of Transportation (Medical): No   Lack of Transportation (Non-Medical): No  Physical Activity: Sufficiently Active   Days of Exercise per Week: 7 days   Minutes of Exercise per Session: 60 min  Stress: No Stress Concern Present   Feeling of Stress : Not at all  Social Connections: Not on file  Intimate Partner Violence: Not At Risk   Fear of Current or Ex-Partner: No   Emotionally Abused: No   Physically Abused: No   Sexually Abused: No    Past Surgical History:  Procedure Laterality Date   arm surgery  2010   BROW LIFT Bilateral 11/25/2019   Procedure: [place];  Surgeon: Karle Starch, MD;  Location: [facility];  Service: Ophthalmology;  Laterality: Bilateral;  sleep apnea   CARDIAC CATHETERIZATION  06/24/11   CARDIAC CATHETERIZATION N/A 01/18/2015   Procedure: Left Heart Cath with coronary angiography;  Surgeon: Minna Merritts, MD;  Location: [facility];  Service: Cardiovascular;  Laterality: N/A;   CARDIAC CATHETERIZATION N/A 01/18/2015   Procedure: Intravascular Pressure Wire/FFR Study;  Surgeon: [name], MD;  Location: [facility];  Service: Cardiovascular;  Laterality: N/A;   CAROTID STENT  03/10/2011   COLONOSCOPY  2010   COLONOSCOPY  06/14/2014   Dr Hilarie Fredrickson   CORONARY ARTERY BYPASS GRAFT N/A 01/24/2015   Procedure: CORONARY ARTERY BYPASS GRAFTING x 4 (LIMA-LAD, SVG-Int 1- Int 2, SVG-PD) ENDOSCOPIC GREATER SAPHENOUS VEIN  HARVEST LEFT LEG;  Surgeon: [name], MD;  Location: [facility];  Service: Open Heart Surgery;  Laterality: N/A;   EMBOLECTOMY  06/15/2019   Procedure: EMBOLECTOMY;  Surgeon: Katha Cabal, MD;  Location: [facility];  Service:  Vascular;;  right superficial femoral artery   ENDARTERECTOMY FEMORAL Right 06/15/2019   Procedure: ENDARTERECTOMY FEMORAL;  Surgeon: Katha Cabal, MD;  Location: [facility];  Service: Vascular;  Laterality: Right;  common femoral profunda femoris superficial femoral   ESOPHAGOGASTRODUODENOSCOPY (EGD) WITH PROPOFOL N/A 04/24/2016   Procedure: ESOPHAGOGASTRODUODENOSCOPY (EGD) WITH PROPOFOL;  Surgeon: Jerene Bears, MD;  Location: [facility];  Service: Gastroenterology;  Laterality: N/A;   EYE SURGERY     lasik 15 yrs. ago, cataracts removed - both eyes    HAMMER TOE SURGERY     right toe   INSERTION OF ILIAC STENT Right 06/15/2019   Procedure: INSERTION OF ILIAC STENT ( STENTING OF SFA/POP ARTERY );  Surgeon: Katha Cabal, MD;  Location: [facility];  Service: Vascular;  Laterality: Right;  angioplpasty and stent placement: right superficial femoral right tibiopopliteal trunk bilateral common iliac arteries   IR IMAGING GUIDED PORT INSERTION  11/09/2020   LEFT HEART CATH AND CORONARY ANGIOGRAPHY Left 06/10/2017   Procedure: LEFT HEART CATH AND CORONARY ANGIOGRAPHY;  Surgeon: Minna Merritts, MD;  Location: [facility];  Service: Cardiovascular;  Laterality: Left;   LOWER EXTREMITY ANGIOGRAPHY Left 01/04/2019   Procedure: LOWER EXTREMITY ANGIOGRAPHY;  Surgeon: Katha Cabal, MD;  Location: [facility];  Service: Cardiovascular;  Laterality: Left;   LOWER EXTREMITY ANGIOGRAPHY Right 01/25/2019   Procedure: LOWER EXTREMITY ANGIOGRAPHY;  Surgeon: Katha Cabal, MD;  Location: [facility];  Service: Cardiovascular;  Laterality: Right;   NASAL SINUS SURGERY  2008   septpolasty, bilateral turbinate reduction   SHOULDER ARTHROSCOPY   2012   TEE WITHOUT CARDIOVERSION N/A 01/24/2015   Procedure: TRANSESOPHAGEAL ECHOCARDIOGRAM (TEE);  Surgeon: [name], MD;  Location: [facility];  Service: Open Heart Surgery;  Laterality: N/A;   TOE SURGERY  1994   UPPER GI ENDOSCOPY  07/2014, 04-24-16   Dr Raquel James   WRIST SURGERY  2011    Family History  Problem Relation Age of Onset   Hypertension Mother    Heart disease Mother    Hypertension Father    Diabetes Father    Lymphoma Sister    Heart disease Brother 70   Cancer Paternal Grandfather    Colon cancer Neg Hx    Prostate cancer Neg Hx    Bladder Cancer Neg Hx    Kidney cancer Neg Hx     Allergies  Allergen Reactions   Flomax [Tamsulosin] Other (See Comments)    Orthostatic dizziness    CBC Latest Ref Rng & Units 12/27/2020 12/17/2020 12/06/2020  WBC 4.0 - 10.5 K/uL 6.9 33.3(H) 10.2  Hemoglobin 13.0 - 17.0 g/dL 13.1 13.1 14.0  Hematocrit 39.0 - 52.0 % 39.3 39.2 41.7  Platelets 150 - 400 K/uL 186 190 282      CMP     Component Value Date/Time   NA 138 12/27/2020 0801   NA 138 01/15/2015 0956   K 4.2 12/27/2020 0801   CL 107 12/27/2020 0801   CO2 23 12/27/2020 0801   GLUCOSE 103 (H) 12/27/2020 0801   BUN 21 12/27/2020 0801   BUN 16 01/15/2015 0956   CREATININE 0.99 12/27/2020 0801   CALCIUM 9.2 12/27/2020 0801   PROT 6.8 12/27/2020 0801   ALBUMIN 4.2 12/27/2020 0801   AST 15 12/27/2020 0801   ALT 14 12/27/2020 0801   ALKPHOS 68 12/27/2020 0801   BILITOT 0.7 12/27/2020 0801   GFRNONAA >60 12/27/2020 0801   GFRAA >60 06/16/2019  1126     VAS [place] ABI WITH/WO TBI  Result Date: 12/27/2020  LOWER EXTREMITY DOPPLER STUDY Patient Name:  ANTONIUS HARTLAGE.  Date of Exam:   12/26/2020 Medical Rec #: [id number]            Accession #:    [id number] Date of Birth: 1941-01-08            Patient Gender: M Patient Age:   58Y Exam Location:  [facility] Procedure:      VAS [place] ABI WITH/WO TBI Referring Phys: [id number] [place]  --------------------------------------------------------------------------------  Indications: Peripheral artery disease, and worsening right calf pain. Other Factors: S/P Interventions.  Vascular Interventions: Bilat CIA/EIA stents; right sfa pop tp trunk stents. Performing Technologist: Concha Norway RVT  Examination Guidelines: A complete evaluation includes at minimum, Doppler waveform signals and systolic blood pressure reading at the level of bilateral brachial, anterior tibial, and posterior tibial arteries, when vessel segments are accessible. Bilateral testing is considered an integral part of a complete examination. Photoelectric Plethysmograph (PPG) waveforms and toe systolic pressure readings are included as required and additional duplex testing as needed. Limited examinations for reoccurring indications may be performed as noted.  ABI Findings: +---------+------------------+-----+----------+--------+ Right    Rt Pressure (mmHg)IndexWaveform  Comment  +---------+------------------+-----+----------+--------+ Brachial 135                                       +---------+------------------+-----+----------+--------+ ATA      69                0.51 monophasic         +---------+------------------+-----+----------+--------+ PTA      64                0.47 monophasic         +---------+------------------+-----+----------+--------+ Great Toe54                0.40 Abnormal           +---------+------------------+-----+----------+--------+ +---------+------------------+-----+--------+-------+ Left     Lt Pressure (mmHg)IndexWaveformComment +---------+------------------+-----+--------+-------+ Brachial 135                                    +---------+------------------+-----+--------+-------+ ATA      130               0.96 biphasic        +---------+------------------+-----+--------+-------+ PTA      131               0.97 biphasic         +---------+------------------+-----+--------+-------+ Great Toe116               0.86 Normal          +---------+------------------+-----+--------+-------+ +-------+-----------+-----------+------------+------------+ ABI/TBIToday's ABIToday's TBIPrevious ABIPrevious TBI +-------+-----------+-----------+------------+------------+ Right  .51        .40        .81         .50          +-------+-----------+-----------+------------+------------+ Left   .96        .86        .93         1.07         +-------+-----------+-----------+------------+------------+ Compared to prior study on 08/09/2020.  Summary: Right: Resting right ankle-brachial index indicates moderate right lower extremity  arterial disease. The right toe-brachial index is abnormal. Left: Resting left ankle-brachial index is within normal range. No evidence of significant left lower extremity arterial disease. The left toe-brachial index is normal.  *See table(s) above for measurements and observations.  Electronically signed by Hortencia Pilar MD on 12/27/2020 at 5:07:04 PM.    Final        Assessment & Plan:   1. Atherosclerosis of native artery of both lower extremities with intermittent claudication (HCC) Recommend:  The patient has experienced increased symptoms and is now describing lifestyle limiting claudication and mild rest pain.   Given the severity of the patient's lower extremity symptoms the patient should undergo angiography and intervention.  Risk and benefits were reviewed the patient.  Indications for the procedure were reviewed.  All questions were answered, the patient agrees to proceed.   The patient should continue walking and begin a more formal exercise program.  The patient should continue antiplatelet therapy and aggressive treatment of the lipid abnormalities  The patient will follow up with me after the angiogram.    2. Primary hypertension Continue antihypertensive medications as already  ordered, these medications have been reviewed and there are no changes at this time.   3. 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  We will reach out to his oncologist, Dr. Rogue Bussing to find the best possible timeframe for intervention.   Current Outpatient Medications on File Prior to Visit  Medication Sig Dispense Refill   acetaminophen (TYLENOL) 325 MG tablet Take by mouth as needed.     Ascorbic Acid (VITAMIN C) 1000 MG tablet Take 1,000 mg by mouth daily.     aspirin 81 MG EC tablet Take 81 mg by mouth daily.       Calcium-Magnesium-Vitamin D (CALCIUM 1200+D3 PO) Take 1 tablet by mouth daily.     Carboxymethylcellul-Glycerin (LUBRICATING EYE DROPS OP) Place 1 drop into both eyes daily as needed (irritation).     Cholecalciferol (VITAMIN D3) 50 MCG (2000 UT) TABS Take 2,000 Units by mouth daily.     Cyanocobalamin (B-12) 5000 MCG CAPS Take 5,000 mcg by mouth daily.     docusate sodium (COLACE) 100 MG capsule Take 100 mg by mouth at bedtime.     lidocaine-prilocaine (EMLA) cream Apply 30 -45 mins prior to port access. 30 g 0   lisinopril (ZESTRIL) 5 MG tablet TAKE 1 TABLET BY MOUTH DAILY 90 tablet 3   Melatonin 10 MG CAPS Take 10 mg by mouth at bedtime as needed (sleep).     metoprolol succinate (TOPROL-XL) 25 MG 24 hr tablet TAKE 1/2 TABLET (12.5 MG) BY MOUTH DAILY 45 tablet 3   Niacinamide-Zn-Cu-Methfo-Se-Cr (NICOTINAMIDE PO) Take 500 mg by mouth daily.     nitroGLYCERIN (NITROSTAT) 0.4 MG SL tablet Place 1 tablet (0.4 mg total) under the tongue every 5 (five) minutes as needed for up to 25 doses for chest pain. 25 tablet 1   NONFORMULARY OR COMPOUNDED ITEM Trimix (30/1/10)-(Pap/Phent/PGE)  Test Dose 3 87ml vials  Qty #3 [place] [phone] Fax [phone] 3 each 0   ondansetron (ZOFRAN) 8 MG tablet One pill every 8 hours as needed for nausea/vomitting. (Patient not taking: Reported on 12/27/2020) 40 tablet  1   Polyethyl Glycol-Propyl Glycol (LUBRICANT EYE DROPS) 0.4-0.3 % SOLN Place 1-2 drops into both eyes 3 (three) times daily as needed (burning eyes.).     prochlorperazine (COMPAZINE) 10 MG tablet Take 1  tablet (10 mg total) by mouth every 6 (six) hours as needed for nausea or vomiting. (Patient not taking: Reported on 12/27/2020) 40 tablet 1   simvastatin (ZOCOR) 40 MG tablet TAKE 1 TABLET BY MOUTH AT BEDTIME 90 tablet 0   traMADol (ULTRAM) 50 MG tablet Take 1 tablet (50 mg total) by mouth 3 (three) times daily as needed for moderate pain. 90 tablet 0   Vitamin E 450 MG (1000 UT) CAPS Take 1,000 Units by mouth daily.     Zinc 50 MG TABS Take 50 mg by mouth daily.     No current facility-administered medications on file prior to visit.    There are no Patient Instructions on file for this visit. No follow-ups on file.   Kris Hartmann, NP

## 2021-01-07 ENCOUNTER — Inpatient Hospital Stay: Payer: PPO | Attending: Internal Medicine

## 2021-01-07 DIAGNOSIS — Z7982 Long term (current) use of aspirin: Secondary | ICD-10-CM | POA: Insufficient documentation

## 2021-01-07 DIAGNOSIS — Z5111 Encounter for antineoplastic chemotherapy: Secondary | ICD-10-CM | POA: Diagnosis not present

## 2021-01-07 DIAGNOSIS — Z5189 Encounter for other specified aftercare: Secondary | ICD-10-CM | POA: Insufficient documentation

## 2021-01-07 DIAGNOSIS — Z87891 Personal history of nicotine dependence: Secondary | ICD-10-CM | POA: Insufficient documentation

## 2021-01-07 DIAGNOSIS — Z79899 Other long term (current) drug therapy: Secondary | ICD-10-CM | POA: Diagnosis not present

## 2021-01-07 DIAGNOSIS — Z5112 Encounter for antineoplastic immunotherapy: Secondary | ICD-10-CM | POA: Diagnosis not present

## 2021-01-07 DIAGNOSIS — I10 Essential (primary) hypertension: Secondary | ICD-10-CM | POA: Insufficient documentation

## 2021-01-07 DIAGNOSIS — C3432 Malignant neoplasm of lower lobe, left bronchus or lung: Secondary | ICD-10-CM | POA: Insufficient documentation

## 2021-01-07 LAB — COMPREHENSIVE METABOLIC PANEL
ALT: 17 U/L (ref 0–44)
AST: 16 U/L (ref 15–41)
Albumin: 4.2 g/dL (ref 3.5–5.0)
Alkaline Phosphatase: 62 U/L (ref 38–126)
Anion gap: 9 (ref 5–15)
BUN: 21 mg/dL (ref 8–23)
CO2: 22 mmol/L (ref 22–32)
Calcium: 8.9 mg/dL (ref 8.9–10.3)
Chloride: 103 mmol/L (ref 98–111)
Creatinine, Ser: 0.96 mg/dL (ref 0.61–1.24)
GFR, Estimated: >60 mL/min (ref >60)
Glucose, Bld: 101 mg/dL — ABNORMAL HIGH (ref 70–99)
Potassium: 4.3 mmol/L (ref 3.5–5.1)
Sodium: 134 mmol/L — ABNORMAL LOW (ref 135–145)
Total Bilirubin: 0.3 mg/dL (ref 0.3–1.2)
Total Protein: 6.9 g/dL (ref 6.5–8.1)

## 2021-01-07 LAB — CBC WITH DIFFERENTIAL/PLATELET
Abs Immature Granulocytes: 0.01 10*3/uL (ref 0.00–0.07)
Basophils Absolute: 0 10*3/uL (ref 0.0–0.1)
Basophils Relative: 1 %
Eosinophils Absolute: 0.1 10*3/uL (ref 0.0–0.5)
Eosinophils Relative: 2 %
HCT: 37.7 % — ABNORMAL LOW (ref 39.0–52.0)
Hemoglobin: 12.7 g/dL — ABNORMAL LOW (ref 13.0–17.0)
Immature Granulocytes: 0 %
Lymphocytes Relative: 31 %
Lymphs Abs: 1.2 10*3/uL (ref 0.7–4.0)
MCH: 31.2 pg (ref 26.0–34.0)
MCHC: 33.7 g/dL (ref 30.0–36.0)
MCV: 92.6 fL (ref 80.0–100.0)
Monocytes Absolute: 0.5 10*3/uL (ref 0.1–1.0)
Monocytes Relative: 12 %
Neutro Abs: 2.1 10*3/uL (ref 1.7–7.7)
Neutrophils Relative %: 54 %
Platelets: 159 10*3/uL (ref 150–400)
RBC: 4.07 MIL/uL — ABNORMAL LOW (ref 4.22–5.81)
RDW: 14.6 % (ref 11.5–15.5)
WBC: 3.9 10*3/uL — ABNORMAL LOW (ref 4.0–10.5)
nRBC: 0 % (ref 0.0–0.2)

## 2021-01-07 NOTE — Telephone Encounter (Signed)
ERx 

## 2021-01-09 DIAGNOSIS — M9905 Segmental and somatic dysfunction of pelvic region: Secondary | ICD-10-CM | POA: Diagnosis not present

## 2021-01-09 DIAGNOSIS — M62830 Muscle spasm of back: Secondary | ICD-10-CM | POA: Diagnosis not present

## 2021-01-09 DIAGNOSIS — M955 Acquired deformity of pelvis: Secondary | ICD-10-CM | POA: Diagnosis not present

## 2021-01-09 DIAGNOSIS — M9903 Segmental and somatic dysfunction of lumbar region: Secondary | ICD-10-CM | POA: Diagnosis not present

## 2021-01-11 ENCOUNTER — Telehealth: Payer: Self-pay | Admitting: Internal Medicine

## 2021-01-11 ENCOUNTER — Telehealth: Payer: Self-pay | Admitting: *Deleted

## 2021-01-11 NOTE — Telephone Encounter (Signed)
Patient called asking for something else for the rash on his neck stating that the cream Dr B ordered for it is not hleping at all

## 2021-01-11 NOTE — Telephone Encounter (Signed)
On 8/05-spoke to patient regarding rash not any worse; just not any better.  No worsening itch.  Will reevaluate at next visit.  Next week.

## 2021-01-14 ENCOUNTER — Ambulatory Visit
Admission: RE | Admit: 2021-01-14 | Discharge: 2021-01-14 | Disposition: A | Discharge status: Home / Self Care | Payer: PPO | Source: Ambulatory Visit | Attending: Internal Medicine | Admitting: Internal Medicine

## 2021-01-14 ENCOUNTER — Other Ambulatory Visit: Payer: Self-pay

## 2021-01-14 DIAGNOSIS — C3492 Malignant neoplasm of unspecified part of left bronchus or lung: Secondary | ICD-10-CM | POA: Diagnosis not present

## 2021-01-14 DIAGNOSIS — I2510 Atherosclerotic heart disease of native coronary artery without angina pectoris: Secondary | ICD-10-CM | POA: Diagnosis not present

## 2021-01-14 DIAGNOSIS — C3432 Malignant neoplasm of lower lobe, left bronchus or lung: Secondary | ICD-10-CM | POA: Insufficient documentation

## 2021-01-14 DIAGNOSIS — C799 Secondary malignant neoplasm of unspecified site: Secondary | ICD-10-CM | POA: Diagnosis not present

## 2021-01-14 DIAGNOSIS — K4090 Unilateral inguinal hernia, without obstruction or gangrene, not specified as recurrent: Secondary | ICD-10-CM | POA: Diagnosis not present

## 2021-01-14 DIAGNOSIS — J432 Centrilobular emphysema: Secondary | ICD-10-CM | POA: Diagnosis not present

## 2021-01-14 DIAGNOSIS — N200 Calculus of kidney: Secondary | ICD-10-CM | POA: Diagnosis not present

## 2021-01-14 DIAGNOSIS — K7689 Other specified diseases of liver: Secondary | ICD-10-CM | POA: Diagnosis not present

## 2021-01-14 MED ORDER — IOHEXOL 350 MG/ML SOLN
100.0000 mL | Freq: Once | INTRAVENOUS | Status: AC | PRN
  Administered 2021-01-14: 100 mL via INTRAVENOUS

## 2021-01-14 NOTE — Telephone Encounter (Signed)
See separate md phone note

## 2021-01-15 ENCOUNTER — Ambulatory Visit
Admission: RE | Admit: 2021-01-15 | Discharge: 2021-01-15 | Disposition: A | Discharge status: Home / Self Care | Payer: PPO | Source: Ambulatory Visit | Attending: Vascular Surgery | Admitting: Vascular Surgery

## 2021-01-15 ENCOUNTER — Other Ambulatory Visit: Payer: Self-pay

## 2021-01-15 ENCOUNTER — Other Ambulatory Visit (INDEPENDENT_AMBULATORY_CARE_PROVIDER_SITE_OTHER): Payer: Self-pay | Admitting: Nurse Practitioner

## 2021-01-15 ENCOUNTER — Encounter
Admission: RE | Disposition: A | Discharge status: Home / Self Care | Payer: Self-pay | Source: Ambulatory Visit | Attending: Vascular Surgery

## 2021-01-15 ENCOUNTER — Encounter: Payer: Self-pay | Admitting: Vascular Surgery

## 2021-01-15 DIAGNOSIS — Z807 Family history of other malignant neoplasms of lymphoid, hematopoietic and related tissues: Secondary | ICD-10-CM | POA: Diagnosis not present

## 2021-01-15 DIAGNOSIS — I6523 Occlusion and stenosis of bilateral carotid arteries: Secondary | ICD-10-CM | POA: Diagnosis not present

## 2021-01-15 DIAGNOSIS — Z888 Allergy status to other drugs, medicaments and biological substances status: Secondary | ICD-10-CM | POA: Diagnosis not present

## 2021-01-15 DIAGNOSIS — Z809 Family history of malignant neoplasm, unspecified: Secondary | ICD-10-CM | POA: Insufficient documentation

## 2021-01-15 DIAGNOSIS — Z85118 Personal history of other malignant neoplasm of bronchus and lung: Secondary | ICD-10-CM | POA: Diagnosis not present

## 2021-01-15 DIAGNOSIS — I70219 Atherosclerosis of native arteries of extremities with intermittent claudication, unspecified extremity: Secondary | ICD-10-CM

## 2021-01-15 DIAGNOSIS — I10 Essential (primary) hypertension: Secondary | ICD-10-CM | POA: Insufficient documentation

## 2021-01-15 DIAGNOSIS — Z8249 Family history of ischemic heart disease and other diseases of the circulatory system: Secondary | ICD-10-CM | POA: Diagnosis not present

## 2021-01-15 DIAGNOSIS — Z79899 Other long term (current) drug therapy: Secondary | ICD-10-CM | POA: Insufficient documentation

## 2021-01-15 DIAGNOSIS — I2510 Atherosclerotic heart disease of native coronary artery without angina pectoris: Secondary | ICD-10-CM | POA: Insufficient documentation

## 2021-01-15 DIAGNOSIS — E785 Hyperlipidemia, unspecified: Secondary | ICD-10-CM | POA: Insufficient documentation

## 2021-01-15 DIAGNOSIS — Z951 Presence of aortocoronary bypass graft: Secondary | ICD-10-CM | POA: Insufficient documentation

## 2021-01-15 DIAGNOSIS — I252 Old myocardial infarction: Secondary | ICD-10-CM | POA: Diagnosis not present

## 2021-01-15 DIAGNOSIS — G4730 Sleep apnea, unspecified: Secondary | ICD-10-CM | POA: Diagnosis not present

## 2021-01-15 DIAGNOSIS — I70213 Atherosclerosis of native arteries of extremities with intermittent claudication, bilateral legs: Secondary | ICD-10-CM | POA: Diagnosis not present

## 2021-01-15 DIAGNOSIS — I70221 Atherosclerosis of native arteries of extremities with rest pain, right leg: Secondary | ICD-10-CM | POA: Insufficient documentation

## 2021-01-15 DIAGNOSIS — Z7982 Long term (current) use of aspirin: Secondary | ICD-10-CM | POA: Insufficient documentation

## 2021-01-15 DIAGNOSIS — Z85820 Personal history of malignant melanoma of skin: Secondary | ICD-10-CM | POA: Insufficient documentation

## 2021-01-15 DIAGNOSIS — Z87891 Personal history of nicotine dependence: Secondary | ICD-10-CM | POA: Insufficient documentation

## 2021-01-15 HISTORY — PX: LOWER EXTREMITY ANGIOGRAPHY: CATH118251

## 2021-01-15 SURGERY — LOWER EXTREMITY ANGIOGRAPHY
Anesthesia: Moderate Sedation | Site: Leg Lower | Laterality: Right

## 2021-01-15 MED ORDER — HYDROMORPHONE HCL 1 MG/ML IJ SOLN
1.0000 mg | Freq: Once | INTRAMUSCULAR | Status: DC | PRN
Start: 2021-01-15 — End: 2021-01-15

## 2021-01-15 MED ORDER — SODIUM CHLORIDE 0.9% FLUSH: 3.0000 mL | Freq: Two times a day (BID) | INTRAVENOUS | Status: DC

## 2021-01-15 MED ORDER — LABETALOL HCL 5 MG/ML IV SOLN: 10.0000 mg | INTRAVENOUS | Status: DC | PRN

## 2021-01-15 MED ORDER — FENTANYL CITRATE (PF) 100 MCG/2ML IJ SOLN
INTRAMUSCULAR | Status: DC | PRN
  Administered 2021-01-15 (×2): 12.5 ug via INTRAVENOUS
  Administered 2021-01-15: 25 ug via INTRAVENOUS
  Administered 2021-01-15: 50 ug via INTRAVENOUS

## 2021-01-15 MED ORDER — SODIUM CHLORIDE 0.9 % IV SOLN: INTRAVENOUS | Status: DC

## 2021-01-15 MED ORDER — IODIXANOL 320 MG/ML IV SOLN
INTRAVENOUS | Status: DC | PRN
  Administered 2021-01-15: 60 mL via INTRA_ARTERIAL

## 2021-01-15 MED ORDER — SODIUM CHLORIDE 0.9% FLUSH: 3.0000 mL | INTRAVENOUS | Status: DC | PRN

## 2021-01-15 MED ORDER — OXYCODONE HCL 5 MG PO TABS: 5.0000 mg | ORAL_TABLET | ORAL | Status: DC | PRN

## 2021-01-15 MED ORDER — ACETAMINOPHEN 325 MG PO TABS: 650.0000 mg | ORAL_TABLET | ORAL | Status: DC | PRN

## 2021-01-15 MED ORDER — MIDAZOLAM HCL 5 MG/5ML IJ SOLN
INTRAMUSCULAR | Status: AC
  Filled 2021-01-15: qty 5

## 2021-01-15 MED ORDER — CEFAZOLIN SODIUM-DEXTROSE 2-4 GM/100ML-% IV SOLN
INTRAVENOUS | Status: AC
  Administered 2021-01-15: 2 g via INTRAVENOUS
  Filled 2021-01-15: qty 100

## 2021-01-15 MED ORDER — MIDAZOLAM HCL 2 MG/2ML IJ SOLN
INTRAMUSCULAR | Status: DC | PRN
  Administered 2021-01-15: 2 mg via INTRAVENOUS
  Administered 2021-01-15 (×3): 1 mg via INTRAVENOUS

## 2021-01-15 MED ORDER — CLOPIDOGREL BISULFATE 75 MG PO TABS: 75.0000 mg | ORAL_TABLET | Freq: Every day | ORAL | 11 refills | Status: DC

## 2021-01-15 MED ORDER — DIPHENHYDRAMINE HCL 50 MG/ML IJ SOLN: 50.0000 mg | Freq: Once | INTRAMUSCULAR | Status: DC | PRN

## 2021-01-15 MED ORDER — CLOPIDOGREL BISULFATE 75 MG PO TABS
ORAL_TABLET | ORAL | Status: AC
  Filled 2021-01-15: qty 4

## 2021-01-15 MED ORDER — METHYLPREDNISOLONE SODIUM SUCC 125 MG IJ SOLR: 125.0000 mg | Freq: Once | INTRAMUSCULAR | Status: DC | PRN

## 2021-01-15 MED ORDER — MORPHINE SULFATE (PF) 4 MG/ML IV SOLN
2.0000 mg | INTRAVENOUS | Status: DC | PRN
Start: 2021-01-15 — End: 2021-01-15

## 2021-01-15 MED ORDER — ONDANSETRON HCL 4 MG/2ML IJ SOLN: 4.0000 mg | Freq: Four times a day (QID) | INTRAMUSCULAR | Status: DC | PRN

## 2021-01-15 MED ORDER — CLOPIDOGREL BISULFATE 300 MG PO TABS
300.0000 mg | ORAL_TABLET | ORAL | Status: AC
  Administered 2021-01-15: 300 mg via ORAL

## 2021-01-15 MED ORDER — FAMOTIDINE 20 MG PO TABS: 40.0000 mg | ORAL_TABLET | Freq: Once | ORAL | Status: DC | PRN

## 2021-01-15 MED ORDER — HYDRALAZINE HCL 20 MG/ML IJ SOLN
5.0000 mg | INTRAMUSCULAR | Status: DC | PRN
Start: 2021-01-15 — End: 2021-01-15

## 2021-01-15 MED ORDER — HEPARIN SODIUM (PORCINE) 1000 UNIT/ML IJ SOLN
INTRAMUSCULAR | Status: AC
  Filled 2021-01-15: qty 1

## 2021-01-15 MED ORDER — SODIUM CHLORIDE 0.9 % IV SOLN: 250.0000 mL | INTRAVENOUS | Status: DC | PRN

## 2021-01-15 MED ORDER — CEFAZOLIN SODIUM-DEXTROSE 2-4 GM/100ML-% IV SOLN: 2.0000 g | Freq: Once | INTRAVENOUS | Status: AC

## 2021-01-15 MED ORDER — HEPARIN SODIUM (PORCINE) 1000 UNIT/ML IJ SOLN
INTRAMUSCULAR | Status: DC | PRN
  Administered 2021-01-15: 5000 [IU] via INTRAVENOUS

## 2021-01-15 MED ORDER — FENTANYL CITRATE (PF) 100 MCG/2ML IJ SOLN
INTRAMUSCULAR | Status: AC
  Filled 2021-01-15: qty 2

## 2021-01-15 MED ORDER — MIDAZOLAM HCL 2 MG/ML PO SYRP: 8.0000 mg | ORAL_SOLUTION | Freq: Once | ORAL | Status: DC | PRN

## 2021-01-15 SURGICAL SUPPLY — 22 items
BALLN LUTONIX 4X220X130 (BALLOONS) ×2
BALLN LUTONIX 5X220X130 (BALLOONS) ×2
BALLN LUTONIX 6X220X130 (BALLOONS) ×4
BALLOON LUTONIX 4X220X130 (BALLOONS) IMPLANT
BALLOON LUTONIX 5X220X130 (BALLOONS) IMPLANT
BALLOON LUTONIX 6X220X130 (BALLOONS) IMPLANT
CATH BEACON 5 .035 65 RIM TIP (CATHETERS) ×1 IMPLANT
CATH PIG 70CM (CATHETERS) ×1 IMPLANT
COVER PROBE U/S 5X48 (MISCELLANEOUS) ×1 IMPLANT
DEVICE STARCLOSE SE CLOSURE (Vascular Products) ×1 IMPLANT
GLIDEWIRE ADV .035X260CM (WIRE) ×1 IMPLANT
KIT ENCORE 26 ADVANTAGE (KITS) ×1 IMPLANT
KIT MICROPUNCTURE NIT STIFF (SHEATH) ×1 IMPLANT
LIFESTENT SOLO 7X200X135 (Permanent Stent) ×1 IMPLANT
NDL ENTRY 21GA 7CM ECHOTIP (NEEDLE) IMPLANT
NEEDLE ENTRY 21GA 7CM ECHOTIP (NEEDLE) ×2 IMPLANT
PACK ANGIOGRAPHY (CUSTOM PROCEDURE TRAY) ×2 IMPLANT
SHEATH BRITE TIP 5FRX11 (SHEATH) ×1 IMPLANT
SHEATH RAABE 6FR (SHEATH) ×1 IMPLANT
SYR MEDRAD MARK 7 150ML (SYRINGE) ×1 IMPLANT
TUBING CONTRAST HIGH PRESS 72 (TUBING) ×1 IMPLANT
WIRE GUIDERIGHT .035X150 (WIRE) ×1 IMPLANT

## 2021-01-15 NOTE — Op Note (Signed)
[facility]  Percutaneous Study/Intervention Procedural Note   Date of Surgery: 01/15/2021  Surgeon:  Katha Cabal, MD.  Pre-operative Diagnosis: Atherosclerotic occlusive disease bilateral lower extremities with rest pain of the right lower extremity  Post-operative diagnosis:  Same  Procedure(s) Performed:             1.  Introduction catheter into right lower extremity 3rd order catheter placement              2.    Contrast injection right lower extremity for distal runoff             3.  Percutaneous transluminal angioplasty and stent placement right superficial femoral artery and popliteal             4.  Star close closure left common femoral arteriotomy  Anesthesia: Conscious sedation was administered under my direct supervision by the interventional radiology RN. IV Versed plus fentanyl were utilized. Continuous ECG, pulse oximetry and blood pressure was monitored throughout the entire procedure.  Conscious sedation was for a total of 56 minutes.  Sheath: 6 [place] Rabie left common femoral retrograde  Contrast: 60 cc  Fluoroscopy Time: 8.0 minutes  Indications:  Randall Ali. presents with worsening claudication and rest pain of the right lower extremity.  He is status post multiple interventions in the past.  Noninvasive studies as well as physical examination demonstrate significant decrease in his distal perfusion.  Angiography is recommended for limb salvage.  The risks and benefits are reviewed all questions answered patient agrees to proceed.  Procedure:  Randall Ali. is a 80 y.o. y.o. male who was identified and appropriate procedural time out was performed.  The patient was then placed supine on the table and prepped and draped in the usual sterile fashion.    Ultrasound was placed in the sterile sleeve and the left groin was evaluated the left common femoral artery was echolucent and pulsatile indicating patency.  Image was  recorded for the permanent record and under real-time visualization a microneedle was inserted into the common femoral artery microwire followed by a micro-sheath.  A J-wire was then advanced through the micro-sheath and a  5 [place] sheath was then inserted over a J-wire. J-wire was then advanced and a 5 French pigtail catheter was positioned at the level of T12. AP projection of the aorta was then obtained. Pigtail catheter was repositioned to above the bifurcation and a LAO view of the pelvis was obtained.  Subsequently a rim catheter with the stiff angle Glidewire was used to cross the aortic bifurcation the catheter wire were advanced down into the right distal external iliac artery. Oblique view of the femoral bifurcation was then obtained and subsequently the wire was reintroduced and the pigtail catheter negotiated into the SFA representing third order catheter placement. Distal runoff was then performed.  Diagnostic interpretation: The abdominal aorta is opacified with a bolus injection contrast.  It is diffusely diseased but there are no hemodynamically significant stenoses.  The colon is completely filled with stool from the right across the transverse including the left and sigmoid.  This obscures the renal arteries.  The aortic bifurcation is widely patent.  Previously placed common iliac artery stents are widely patent.  External iliac arteries are widely patent bilaterally as well.  The right common femoral is patent as is the profunda femoris.  The right superficial femoral demonstrates previously placed stents in the midportion there is a stent fracture associated  with a greater than 90% stenosis over a distance of approximately 4 cm.  Proximal and distal to this there are multiple greater than 70% stenoses noted most which are 1 to 2 cm in length.  Essentially the whole SFA and popliteal are involved.  The previously placed stent in the tibioperoneal trunk remains widely patent anterior tibial  occludes shortly after its origin posterior tibial and peroneal are patent with the posterior tibial being the dominant runoff to the foot and actually of fairly large caliber.  5000 units of heparin was then given and allowed to circulate and a 6 [place] Rabie sheath was advanced up and over the bifurcation and positioned in the femoral artery  KMP  catheter and stiff angle Glidewire were then negotiated down into the distal popliteal.  Distal runoff was then completed by hand injection through the catheter. The wire was then reintroduced and a 4 mm x 22 cm Lutonix balloon followed by a 5 mm x 22 cm Lutonix drug-eluting balloon and then a 6 mm x 22 cm Lutonix drug-eluting balloon was used to angioplasty the superficial femoral and popliteal arteries. Inflations were to 10 atmospheres for 2 minutes. Follow-up imaging demonstrated patency with greater than 50% residual stenosis particularly in the area of the stent fracture and therefore a 7 mm x 200 mm life stent was deployed and subsequently postdilated with a 6 mm Lutonix drug-eluting balloon. Distal runoff was then reassessed.  After review of these images the sheath is pulled into the left external iliac oblique of the common femoral is obtained and a Star close device deployed. There no immediate complications.   Findings:   The abdominal aorta is opacified with a bolus injection contrast.  It is diffusely diseased but there are no hemodynamically significant stenoses.  The colon is completely filled with stool from the right across the transverse including the left and sigmoid.  This obscures the renal arteries.  The aortic bifurcation is widely patent.  Previously placed common iliac artery stents are widely patent.  External iliac arteries are widely patent bilaterally as well.  The right common femoral is patent as is the profunda femoris.  The right superficial femoral demonstrates previously placed stents in the midportion there is a stent  fracture associated with a greater than 90% stenosis over a distance of approximately 4 cm.  Proximal and distal to this there are multiple greater than 70% stenoses noted most which are 1 to 2 cm in length.  Essentially the whole SFA and popliteal are involved.  The previously placed stent in the tibioperoneal trunk remains widely patent anterior tibial occludes shortly after its origin posterior tibial and peroneal are patent with the posterior tibial being the dominant runoff to the foot and actually of fairly large caliber.  Angioplasty of the SFA demonstrates greater than 50% residual stenosis and therefore a life stent is deployed postdilated with a 6 mm balloon.  Follow-up imaging now shows with less than 10% residual stenosis throughout the entire SFA and popliteal with preservation of the posterior tibial runoff.    Summary: Successful recanalization right lower extremity for limb salvage                        Disposition: Patient was taken to the recovery room in stable condition having tolerated the procedure well.  Shivonne Schwartzman, Dolores Lory 01/15/2021,9:30 AM

## 2021-01-15 NOTE — Interval H&P Note (Signed)
History and Physical Interval Note:  01/15/2021 7:55 AM  Randall Ali.  has presented today for surgery, with the diagnosis of RT Lower Extremity Angiography   ASO with claudication   BARD Rep cc: Judi Cong.  The various methods of treatment have been discussed with the patient and family. After consideration of risks, benefits and other options for treatment, the patient has consented to  Procedure(s): LOWER EXTREMITY ANGIOGRAPHY (Right) as a surgical intervention.  [name] history has been reviewed, patient examined, no change in status, stable for surgery.  I have reviewed the patient's chart and labs.  Questions were answered to the patient's satisfaction.     Hortencia Pilar

## 2021-01-17 ENCOUNTER — Telehealth: Payer: Self-pay | Admitting: Internal Medicine

## 2021-01-17 ENCOUNTER — Ambulatory Visit: Payer: PPO

## 2021-01-17 ENCOUNTER — Other Ambulatory Visit: Payer: PPO

## 2021-01-17 ENCOUNTER — Ambulatory Visit: Payer: PPO | Admitting: Internal Medicine

## 2021-01-17 NOTE — Telephone Encounter (Signed)
Appt scheduled then cancelled... patient showed up for appt today... wasn't notified to come next week vs this week.... Gave gas card to apologize for scheduling error

## 2021-01-18 ENCOUNTER — Other Ambulatory Visit: Payer: Self-pay | Admitting: *Deleted

## 2021-01-18 ENCOUNTER — Ambulatory Visit: Payer: PPO

## 2021-01-18 DIAGNOSIS — C3432 Malignant neoplasm of lower lobe, left bronchus or lung: Secondary | ICD-10-CM

## 2021-01-20 ENCOUNTER — Encounter: Payer: Self-pay | Admitting: Family Medicine

## 2021-01-24 ENCOUNTER — Encounter: Payer: Self-pay | Admitting: Internal Medicine

## 2021-01-24 ENCOUNTER — Inpatient Hospital Stay: Payer: PPO | Admitting: Internal Medicine

## 2021-01-24 ENCOUNTER — Inpatient Hospital Stay: Payer: PPO

## 2021-01-24 ENCOUNTER — Other Ambulatory Visit: Payer: Self-pay

## 2021-01-24 DIAGNOSIS — C3432 Malignant neoplasm of lower lobe, left bronchus or lung: Secondary | ICD-10-CM

## 2021-01-24 DIAGNOSIS — Z5112 Encounter for antineoplastic immunotherapy: Secondary | ICD-10-CM | POA: Diagnosis not present

## 2021-01-24 LAB — CBC WITH DIFFERENTIAL/PLATELET
Abs Immature Granulocytes: 0.03 10*3/uL (ref 0.00–0.07)
Basophils Absolute: 0 10*3/uL (ref 0.0–0.1)
Basophils Relative: 1 %
Eosinophils Absolute: 0.1 10*3/uL (ref 0.0–0.5)
Eosinophils Relative: 2 %
HCT: 40.2 % (ref 39.0–52.0)
Hemoglobin: 13.5 g/dL (ref 13.0–17.0)
Immature Granulocytes: 1 %
Lymphocytes Relative: 19 %
Lymphs Abs: 1.1 10*3/uL (ref 0.7–4.0)
MCH: 31.3 pg (ref 26.0–34.0)
MCHC: 33.6 g/dL (ref 30.0–36.0)
MCV: 93.1 fL (ref 80.0–100.0)
Monocytes Absolute: 0.6 10*3/uL (ref 0.1–1.0)
Monocytes Relative: 10 %
Neutro Abs: 3.9 10*3/uL (ref 1.7–7.7)
Neutrophils Relative %: 67 %
Platelets: 154 10*3/uL (ref 150–400)
RBC: 4.32 MIL/uL (ref 4.22–5.81)
RDW: 14.2 % (ref 11.5–15.5)
WBC: 5.8 10*3/uL (ref 4.0–10.5)
nRBC: 0 % (ref 0.0–0.2)

## 2021-01-24 LAB — COMPREHENSIVE METABOLIC PANEL
ALT: 13 U/L (ref 0–44)
AST: 15 U/L (ref 15–41)
Albumin: 4 g/dL (ref 3.5–5.0)
Alkaline Phosphatase: 56 U/L (ref 38–126)
Anion gap: 8 (ref 5–15)
BUN: 24 mg/dL — ABNORMAL HIGH (ref 8–23)
CO2: 24 mmol/L (ref 22–32)
Calcium: 9 mg/dL (ref 8.9–10.3)
Chloride: 104 mmol/L (ref 98–111)
Creatinine, Ser: 1.01 mg/dL (ref 0.61–1.24)
GFR, Estimated: >60 mL/min (ref >60)
Glucose, Bld: 101 mg/dL — ABNORMAL HIGH (ref 70–99)
Potassium: 4.2 mmol/L (ref 3.5–5.1)
Sodium: 136 mmol/L (ref 135–145)
Total Bilirubin: 0.6 mg/dL (ref 0.3–1.2)
Total Protein: 7.1 g/dL (ref 6.5–8.1)

## 2021-01-24 MED ORDER — HEPARIN SOD (PORK) LOCK FLUSH 100 UNIT/ML IV SOLN
INTRAVENOUS | Status: AC
  Administered 2021-01-24: 500 [IU] via INTRAVENOUS
  Filled 2021-01-24: qty 5

## 2021-01-24 MED ORDER — DIPHENHYDRAMINE HCL 50 MG/ML IJ SOLN
50.0000 mg | Freq: Once | INTRAMUSCULAR | Status: AC
  Administered 2021-01-24: 50 mg via INTRAVENOUS
  Filled 2021-01-24: qty 1

## 2021-01-24 MED ORDER — FAMOTIDINE 20 MG IN NS 100 ML IVPB
20.0000 mg | Freq: Once | INTRAVENOUS | Status: AC
  Administered 2021-01-24: 20 mg via INTRAVENOUS
  Filled 2021-01-24: qty 20

## 2021-01-24 MED ORDER — SODIUM CHLORIDE 0.9 % IV SOLN
530.0000 mg | Freq: Once | INTRAVENOUS | Status: AC
  Administered 2021-01-24: 530 mg via INTRAVENOUS
  Filled 2021-01-24: qty 53

## 2021-01-24 MED ORDER — PALONOSETRON HCL INJECTION 0.25 MG/5ML
0.2500 mg | Freq: Once | INTRAVENOUS | Status: AC
  Administered 2021-01-24: 0.25 mg via INTRAVENOUS
  Filled 2021-01-24: qty 5

## 2021-01-24 MED ORDER — SODIUM CHLORIDE 0.9 % IV SOLN
Freq: Once | INTRAVENOUS | Status: AC
  Filled 2021-01-24: qty 250

## 2021-01-24 MED ORDER — SODIUM CHLORIDE 0.9% FLUSH
10.0000 mL | Freq: Once | INTRAVENOUS | Status: AC
  Administered 2021-01-24: 10 mL via INTRAVENOUS
  Filled 2021-01-24: qty 10

## 2021-01-24 MED ORDER — HEPARIN SOD (PORK) LOCK FLUSH 100 UNIT/ML IV SOLN
500.0000 [IU] | Freq: Once | INTRAVENOUS | Status: AC
  Filled 2021-01-24: qty 5

## 2021-01-24 MED ORDER — SODIUM CHLORIDE 0.9 % IV SOLN
200.0000 mg/m2 | Freq: Once | INTRAVENOUS | Status: AC
  Administered 2021-01-24: 420 mg via INTRAVENOUS
  Filled 2021-01-24: qty 70

## 2021-01-24 MED ORDER — SODIUM CHLORIDE 0.9 % IV SOLN
10.0000 mg | Freq: Once | INTRAVENOUS | Status: AC
  Administered 2021-01-24: 10 mg via INTRAVENOUS
  Filled 2021-01-24: qty 10

## 2021-01-24 MED ORDER — SODIUM CHLORIDE 0.9 % IV SOLN
150.0000 mg | Freq: Once | INTRAVENOUS | Status: AC
  Administered 2021-01-24: 150 mg via INTRAVENOUS
  Filled 2021-01-24: qty 150

## 2021-01-24 MED ORDER — SODIUM CHLORIDE 0.9 % IV SOLN
200.0000 mg | Freq: Once | INTRAVENOUS | Status: AC
  Administered 2021-01-24: 200 mg via INTRAVENOUS
  Filled 2021-01-24: qty 8

## 2021-01-24 NOTE — Progress Notes (Signed)
[facility] NOTE  Patient Care Team: Ria Bush, MD as PCP - General (Family Medicine) [name] [name], MD as PCP - Cardiology (Cardiology) [name], MD (Internal Medicine) Minna Merritts, MD as Consulting Physician (Cardiology) Pieter Partridge, DO as Consulting Physician (Neurology) Cammie Sickle, MD as Consulting Physician (Hematology and Oncology) Telford Nab, RN as Oncology Nurse Navigator  CHIEF COMPLAINTS/PURPOSE OF CONSULTATION: lung cancer    Oncology History Overview Note  #MAY 2022-Lung cancer-non-small cell [CT guided bx] T3N1 vs stage IV Dr.Hendrickson.  MRI brain negative for malignancy.# 1. April 2022- LLL ~4.0 cm mass in the superior segment left lower lobe abuts the major fissure and the posterior pleural surface without visible chest wall invasion without pleural effusion. There 2-3 other small nodules in the left lower lobe, largest measures 9 mm in diameter. These are suspicious for same lobe satellite lesions and there is left hilar adenopathy. Assuming non-small cell lung cancer the appearance is compatible with T3 N1 M0 disease (stage IIIA).  # right suprarenal nodule-awaiting biopsy on 5/31- non-small cell [revived at tumor conference at [place]  # June 9th 2022- CARBO-TAXOl-KEYTRUDA; Fulphila  MOLECULAR TESTING: NGS TPS-PDL 100%; EXON 12 amplification* mutations.    # CAD [CABG 2016; Dr.Gollan]   Cancer of lower lobe of left lung ([place])  11/02/2020 Initial Diagnosis   Cancer of lower lobe of left lung ([place])   11/02/2020 Cancer Staging   Staging form: Lung, AJCC 8th Edition - Clinical: Stage IVA (cT3, cN1, cM1a) - Signed by Cammie Sickle, MD on 11/02/2020   11/16/2020 -  Chemotherapy    Patient is on Treatment Plan: LUNG NSCLC CARBOPLATIN + PACLITAXEL + PEMBROLIZUMAB Q21D X 4 CYCLES / PEMBROLIZUMAB MAINTENANCE Q21D          HISTORY OF PRESENTING ILLNESS:  Randall Ali. 80 y.o.  male lung  cancer-non-small cell stage IV [supra-renal nodule s/p biopsy] currently on CarboTaxol Keytruda status post cycle #3 is here for follow-up/review results of the CT scan.  In the interim patient evaluated by vascular-had stent placed for PVD.  Patient states his leg pain is improved post stent placement.  His skin rash in the back is improved with Kenalog ointment.  Complains of fatigue post treatment.  Otherwise no headaches.  No shortness of breath or cough.  Review of Systems  Constitutional:  Positive for malaise/fatigue. Negative for chills, diaphoresis, fever and weight loss.  HENT:  Negative for nosebleeds and sore throat.   Eyes:  Negative for double vision.  Respiratory:  Negative for cough, hemoptysis, sputum production, shortness of breath and wheezing.   Cardiovascular:  Negative for chest pain, palpitations, orthopnea and leg swelling.  Gastrointestinal:  Negative for abdominal pain, blood in stool, constipation, diarrhea, heartburn, melena, nausea and vomiting.  Genitourinary:  Negative for dysuria, frequency and urgency.  Musculoskeletal:  Positive for back pain and joint pain.  Skin: Negative.  Negative for itching and rash.  Neurological:  Negative for dizziness, tingling, focal weakness, weakness and headaches.  Endo/Heme/Allergies:  Does not bruise/bleed easily.  Psychiatric/Behavioral:  Negative for depression. The patient is not nervous/anxious and does not have insomnia.     MEDICAL HISTORY:  Past Medical History:  Diagnosis Date  . Allergy    seasonal  . Arthritis    all over- in general   . CAD (coronary artery disease)    a. inferior wall MI 10/01 s/p PCI/DES to RCA; b. Myoview 3/16 neg for ischemia; c. LHC  8/16: ostLAD 80%, OM1 70%, OM2 70% x 2 lesions, mRCA 30%, dRCA 70% s/p 4-V CABG 01/24/15 (LIMA-LAD, VG- OM1, VG-OM2, VG-PDA)   . Cancer (HCC)    skin, melanoma  . Carotid artery disease ([place])    a. [place] 8/16: 1-39% bilateral ICA stenosis  . Cataract     removed  . Diastolic dysfunction    a. TTE 8/16: EF 55-60%, no RWMA, Gr1DD, calcified mitral annulus, mild biatrial enlargement  . Erectile dysfunction   . GERD (gastroesophageal reflux disease)   . History of elbow surgery   . History of hiatal hernia   . HLD (hyperlipidemia)   . HTN (hypertension)   . Inferior myocardial infarction ([place]) 03/2000   stent RCA  . Lung cancer ([place])   . Postoperative wound infection 02/02/2015  . Reflux esophagitis   . Sleep apnea 2017   CPAP at night    SURGICAL HISTORY: Past Surgical History:  Procedure Laterality Date  . arm surgery  2010  . BROW LIFT Bilateral 11/25/2019   Procedure: BROW PTOSIS REPAIR BILATERAL;  Surgeon: Karle Starch, MD;  Location: [facility];  Service: Ophthalmology;  Laterality: Bilateral;  sleep apnea  . CARDIAC CATHETERIZATION  06/24/2011  . CARDIAC CATHETERIZATION N/A 01/18/2015   Procedure: Left Heart Cath with coronary angiography;  Surgeon: Minna Merritts, MD;  Location: [facility];  Service: Cardiovascular;  Laterality: N/A;  . CARDIAC CATHETERIZATION N/A 01/18/2015   Procedure: Intravascular Pressure Wire/FFR Study;  Surgeon: [name], MD;  Location: [facility];  Service: Cardiovascular;  Laterality: N/A;  . CAROTID STENT  03/10/2011  . COLONOSCOPY  2010  . COLONOSCOPY  06/14/2014   Dr Hilarie Fredrickson  . CORONARY ARTERY BYPASS GRAFT N/A 01/24/2015   Procedure: CORONARY ARTERY BYPASS GRAFTING x 4 (LIMA-LAD, SVG-Int 1- Int 2, SVG-PD) ENDOSCOPIC GREATER SAPHENOUS VEIN HARVEST LEFT LEG;  Surgeon: [name], MD;  Location: [facility];  Service: Open Heart Surgery;  Laterality: N/A;  . EMBOLECTOMY  06/15/2019   Procedure: EMBOLECTOMY;  Surgeon: Katha Cabal, MD;  Location: [facility];  Service: Vascular;;  right superficial femoral artery  . ENDARTERECTOMY FEMORAL Right 06/15/2019   Procedure: ENDARTERECTOMY FEMORAL;  Surgeon: Katha Cabal, MD;  Location: [facility];  Service:  Vascular;  Laterality: Right;  common femoral profunda femoris superficial femoral  . ESOPHAGOGASTRODUODENOSCOPY (EGD) WITH PROPOFOL N/A 04/24/2016   Procedure: ESOPHAGOGASTRODUODENOSCOPY (EGD) WITH PROPOFOL;  Surgeon: Jerene Bears, MD;  Location: [facility];  Service: Gastroenterology;  Laterality: N/A;  . EYE SURGERY     lasik 15 yrs. ago, cataracts removed - both eyes   . HAMMER TOE SURGERY     right toe  . INSERTION OF ILIAC STENT Right 06/15/2019   Procedure: INSERTION OF ILIAC STENT ( STENTING OF SFA/POP ARTERY );  Surgeon: Katha Cabal, MD;  Location: [facility];  Service: Vascular;  Laterality: Right;  angioplpasty and stent placement: right superficial femoral right tibiopopliteal trunk bilateral common iliac arteries  . IR IMAGING GUIDED PORT INSERTION  11/09/2020  . LEFT HEART CATH AND CORONARY ANGIOGRAPHY Left 06/10/2017   Procedure: LEFT HEART CATH AND CORONARY ANGIOGRAPHY;  Surgeon: Minna Merritts, MD;  Location: [facility];  Service: Cardiovascular;  Laterality: Left;  . LOWER EXTREMITY ANGIOGRAPHY Left 01/04/2019   Procedure: LOWER EXTREMITY ANGIOGRAPHY;  Surgeon: Katha Cabal, MD;  Location: [facility];  Service: Cardiovascular;  Laterality: Left;  . LOWER EXTREMITY ANGIOGRAPHY Right 01/25/2019  Procedure: LOWER EXTREMITY ANGIOGRAPHY;  Surgeon: Katha Cabal, MD;  Location: [facility];  Service: Cardiovascular;  Laterality: Right;  . LOWER EXTREMITY ANGIOGRAPHY Right 01/15/2021   LOWER EXTREMITY ANGIOGRAPHY and stent placement to R SFA and popliteal artery (Schnier, Dolores Lory, MD)  . NASAL SINUS SURGERY  2008   septpolasty, bilateral turbinate reduction  . SHOULDER ARTHROSCOPY  2012  . TEE WITHOUT CARDIOVERSION N/A 01/24/2015   Procedure: TRANSESOPHAGEAL ECHOCARDIOGRAM (TEE);  Surgeon: [name], MD;  Location: [facility];  Service: Open Heart Surgery;  Laterality: N/A;  . [place]  . UPPER GI ENDOSCOPY   07/2014, 04-24-16   Dr Raquel James  . WRIST SURGERY  2011    SOCIAL HISTORY: Social History   Socioeconomic History  . Marital status: Single    Spouse name: Not on file  . Number of children: Not on file  . Years of education: 74  . Highest education level: Not on file  Occupational History  . Occupation: retired    Comment: Agricultural consultant  Tobacco Use  . Smoking status: Former    Packs/day: 2.50    Years: 40.00    Pack years: 100.00    Types: Cigarettes    Quit date: 03/24/2000    Years since quitting: 20.8  . Smokeless tobacco: Never  Vaping Use  . Vaping Use: Never used  Substance and Sexual Activity  . Alcohol use: Yes    Comment: occassionally  . Drug use: No  . Sexual activity: Yes  Other Topics Concern  . Not on file  Social History Narrative   Singled; lives with son and dog    Occ: retired, back part time at [employer];    Activity: gym 4-5d/wk   Diet: good water, fruits/vegetables daily   Caffeine Use-yes      ------------------------------------       Car sales- retd; [employer]- retd; quit smoking 2001. Alcohol couple nights a week. Live in [place]. Daughter lives 10 mins.    Social Determinants of Health   Financial Resource Strain: Low Risk   . Difficulty of Paying Living Expenses: Not hard at all  Food Insecurity: No Food Insecurity  . Worried About Charity fundraiser in the Last Year: Never true  . Ran Out of Food in the Last Year: Never true  Transportation Needs: No Transportation Needs  . Lack of Transportation (Medical): No  . Lack of Transportation (Non-Medical): No  Physical Activity: Sufficiently Active  . Days of Exercise per Week: 7 days  . Minutes of Exercise per Session: 60 min  Stress: No Stress Concern Present  . Feeling of Stress : Not at all  Social Connections: Not on file  Intimate Partner Violence: Not At Risk  . Fear of Current or Ex-Partner: No  . Emotionally Abused: No  . Physically Abused: No  . Sexually Abused: No     FAMILY HISTORY: Family History  Problem Relation Age of Onset  . Hypertension Mother   . Heart disease Mother   . Hypertension Father   . Diabetes Father   . Lymphoma Sister   . Heart disease Brother 50  . Cancer Paternal Grandfather   . Colon cancer Neg Hx   . Prostate cancer Neg Hx   . Bladder Cancer Neg Hx   . Kidney cancer Neg Hx     ALLERGIES:  is allergic to flomax [tamsulosin].  MEDICATIONS:  Current Outpatient Medications  Medication Sig Dispense Refill  . acetaminophen (  TYLENOL) 325 MG tablet Take 650 mg by mouth as needed for moderate pain or mild pain.    . Ascorbic Acid (VITAMIN C) 1000 MG tablet Take 1,000 mg by mouth daily.    [name] aspirin 81 MG EC tablet Take 81 mg by mouth daily.      . Calcium-Magnesium-Vitamin D (CALCIUM 1200+D3 PO) Take 1 tablet by mouth daily.    . Carboxymethylcellul-Glycerin (LUBRICATING EYE DROPS OP) Place 1 drop into both eyes daily as needed (irritation).    . Cholecalciferol (VITAMIN D3) 50 MCG (2000 UT) TABS Take 2,000 Units by mouth daily.    . clopidogrel (PLAVIX) 75 MG tablet Take 1 tablet (75 mg total) by mouth daily. 30 tablet 11  . Cyanocobalamin (B-12) 5000 MCG CAPS Take 5,000 mcg by mouth daily.    [name] docusate sodium (COLACE) 100 MG capsule Take 200 mg by mouth at bedtime.    . lidocaine-prilocaine (EMLA) cream Apply 30 -45 mins prior to port access. 30 g 0  . lisinopril (ZESTRIL) 5 MG tablet TAKE 1 TABLET BY MOUTH DAILY 90 tablet 3  . loratadine (CLARITIN) 10 MG tablet Take 10 mg by mouth daily.    . Melatonin 10 MG CAPS Take 10 mg by mouth at bedtime as needed (sleep).    . metoprolol succinate (TOPROL-XL) 25 MG 24 hr tablet TAKE 1/2 TABLET (12.5 MG) BY MOUTH DAILY 45 tablet 3  . Niacinamide-Zn-Cu-Methfo-Se-Cr (NICOTINAMIDE PO) Take 500 mg by mouth daily.    . nitroGLYCERIN (NITROSTAT) 0.4 MG SL tablet Place 1 tablet (0.4 mg total) under the tongue every 5 (five) minutes as needed for up to 25 doses for chest pain. 25 tablet  1  . pantoprazole (PROTONIX) 40 MG tablet TAKE 1 TABLET BY MOUTH DAILY (Patient taking differently: Take 40 mg by mouth daily.) 90 tablet 3  . Polyethyl Glycol-Propyl Glycol (LUBRICANT EYE DROPS) 0.4-0.3 % SOLN Place 1-2 drops into both eyes 3 (three) times daily as needed (burning eyes.).    [name] simvastatin (ZOCOR) 40 MG tablet TAKE 1 TABLET BY MOUTH AT BEDTIME 90 tablet 0  . traMADol (ULTRAM) 50 MG tablet TAKE 1 TABLET BY MOUTH 3 TIMES DAILY AS NEEDED FOR MODERATE PAIN 90 tablet 0  . Vitamin E 450 MG (1000 UT) CAPS Take 450 Units by mouth daily.    . Zinc 50 MG TABS Take 50 mg by mouth daily.    . ondansetron (ZOFRAN) 8 MG tablet One pill every 8 hours as needed for nausea/vomitting. (Patient not taking: Reported on 01/24/2021) 40 tablet 1  . prochlorperazine (COMPAZINE) 10 MG tablet Take 1 tablet (10 mg total) by mouth every 6 (six) hours as needed for nausea or vomiting. (Patient not taking: Reported on 01/24/2021) 40 tablet 1   No current facility-administered medications for this visit.   Facility-Administered Medications Ordered in Other Visits  Medication Dose Route Frequency Provider Last Rate Last Admin  . CARBOplatin (PARAPLATIN) 530 mg in sodium chloride 0.9 % 250 mL chemo infusion  530 mg Intravenous Once Charlaine Dalton R, MD      . dexamethasone (DECADRON) 10 mg in sodium chloride 0.9 % 50 mL IVPB  10 mg Intravenous Once Charlaine Dalton R, MD      . diphenhydrAMINE (BENADRYL) injection 50 mg  50 mg Intravenous Once Charlaine Dalton R, MD      . famotidine (PEPCID) IVPB 20 mg in NS 100 mL IVPB  20 mg Intravenous Once Cammie Sickle, MD      .  fosaprepitant (EMEND) 150 mg in sodium chloride 0.9 % 145 mL IVPB  150 mg Intravenous Once Charlaine Dalton R, MD      . heparin lock flush 100 unit/mL  500 Units Intravenous Once Charlaine Dalton R, MD      . PACLitaxel (TAXOL) 420 mg in sodium chloride 0.9 % 500 mL chemo infusion (> 80mg /m2)  200 mg/m2 (Treatment Plan  Recorded) Intravenous Once Charlaine Dalton R, MD      . palonosetron (ALOXI) injection 0.25 mg  0.25 mg Intravenous Once Charlaine Dalton R, MD      . pembrolizumab [facility]) 200 mg in sodium chloride 0.9 % 50 mL chemo infusion  200 mg Intravenous Once Cammie Sickle, MD          .  PHYSICAL EXAMINATION: ECOG PERFORMANCE STATUS: 0 - Asymptomatic  Vitals:   01/24/21 0845  BP: 126/70  Pulse: 70  Resp: 18  Temp: (!) 96.8 F (36 C)    There were no vitals filed for this visit.    Physical Exam Constitutional:      Comments: Ambulatory independently; accompanied by his daughter  HENT:     Head: Normocephalic and atraumatic.     Mouth/Throat:     Pharynx: No oropharyngeal exudate.  Eyes:     Pupils: Pupils are equal, round, and reactive to light.  Cardiovascular:     Rate and Rhythm: Normal rate and regular rhythm.  Pulmonary:     Effort: No respiratory distress.     Breath sounds: No wheezing.  Abdominal:     General: Bowel sounds are normal. There is no distension.     Palpations: Abdomen is soft. There is no mass.     Tenderness: There is no abdominal tenderness. There is no guarding or rebound.  Musculoskeletal:        General: No tenderness. Normal range of motion.     Cervical back: Normal range of motion and neck supple.  Skin:    General: Skin is warm.  Neurological:     Mental Status: He is alert and oriented to person, place, and time.  Psychiatric:        Mood and Affect: Affect normal.     LABORATORY DATA:  I have reviewed the data as listed Lab Results  Component Value Date   WBC 5.8 01/24/2021   HGB 13.5 01/24/2021   HCT 40.2 01/24/2021   MCV 93.1 01/24/2021   PLT 154 01/24/2021   Recent Labs    12/27/20 0801 01/07/21 1107 01/24/21 0828  NA 138 134* 136  K 4.2 4.3 4.2  CL 107 103 104  CO2 23 22 24   GLUCOSE 103* 101* 101*  BUN 21 21 24*  CREATININE 0.99 0.96 1.01  CALCIUM 9.2 8.9 9.0  GFRNONAA >60 >60 >60  PROT 6.8  6.9 7.1  ALBUMIN 4.2 4.2 4.0  AST 15 16 15   ALT 14 17 13   ALKPHOS 68 62 56  BILITOT 0.7 0.3 0.6    RADIOGRAPHIC STUDIES: I have personally reviewed the radiological images as listed and agreed with the findings in the report. CT CHEST ABDOMEN PELVIS W CONTRAST  Result Date: 01/15/2021 CLINICAL DATA:  Metastatic left lung cancer restaging, biopsy proven metastatic nodule adjacent to the superior pole of the right kidney, assess treatment response EXAM: CT CHEST, ABDOMEN, AND PELVIS WITH CONTRAST TECHNIQUE: Multidetector CT imaging of the chest, abdomen and pelvis was performed following the standard protocol during bolus administration of intravenous contrast. CONTRAST:  150mL OMNIPAQUE  IOHEXOL 350 MG/ML SOLN, additional oral enteric contrast COMPARISON:  MR abdomen, 11/19/2020, PET-CT, 10/17/2020, CT chest, 10/08/2020 FINDINGS: CT CHEST FINDINGS Cardiovascular: Right chest port catheter. Aortic atherosclerosis. Normal heart size. Three-vessel coronary artery calcifications. No pericardial effusion. Mediastinum/Nodes: Previously seen enlarged posterior left hilar lymph node is reduced in size, measuring 1.2 x 0.9 cm, previously 1.7 x 1.2 cm (series 2, image 32). No residual enlarged mediastinal, hilar, or axillary lymph nodes. Thyroid gland, trachea, and esophagus demonstrate no significant findings. Lungs/Pleura: Mild centrilobular and paraseptal emphysema. Diffuse bilateral bronchial wall thickening.Subpleural mass of the superior segment left lower lobe is decreased in size, measuring 3.0 x 2.2 cm, previously 4.0 x 3.0 cm (series 3, image 76). An adjacent nodule abutting the fissure is almost completely resolved, measuring no greater than 2 mm (series 3, image 87). There is a new irregular nodule or consolidation of the dependent right upper lobe measuring 1.6 x 1.3 cm (series 3, image 73). Stable, partially calcified benign incidental nodule of the medial right upper lobe measuring 6 mm (series 3,  image 58). Unchanged minimal dependent bibasilar scarring and or atelectasis. No pleural effusion or pneumothorax. Musculoskeletal: No chest wall mass or suspicious bone lesions identified. CT ABDOMEN PELVIS FINDINGS Hepatobiliary: No solid liver abnormality is seen. Multiple low-attenuation cysts of the liver, previously better characterized by MRI. No gallstones, gallbladder wall thickening, or biliary dilatation. Pancreas: Unremarkable. No pancreatic ductal dilatation or surrounding inflammatory changes. Spleen: Normal in size without significant abnormality. Adrenals/Urinary Tract: Adrenal glands are unremarkable. Interval decrease in size of a metastatic soft tissue nodule adjacent to the superior pole of the right kidney, in the hepatorenal fossa, measuring 2.2 x 2.0 cm, previously 3.7 x 3.2 cm (series 2, image 63). Small nonobstructive left renal calculi. No right-sided calculi, ureteral calculi, or hydronephrosis. The bladder is normal. Stomach/Bowel: Stomach is within normal limits. Appendix appears normal. No evidence of bowel wall thickening, distention, or inflammatory changes. Large burden of stool throughout the colon and rectum. Vascular/Lymphatic: Aortic atherosclerosis. No enlarged abdominal or pelvic lymph nodes. Reproductive: No mass or other abnormality. Other: Fat containing left inguinal hernia. No abdominopelvic ascites. Musculoskeletal: No acute or significant osseous findings. IMPRESSION: 1. Subpleural mass of the superior segment left lower lobe is decreased in size. 2. An adjacent nodule of the left lower lobe abutting the fissure is almost completely resolved. 3. Interval decrease in size of enlarged left hilar lymph node. 4. Interval decrease in size of a metastatic soft tissue nodule adjacent to the superior pole of the right kidney. 5. Constellation of findings is consistent with treatment response. 6. There is a new irregular nodule or consolidation of the dependent right upper lobe  measuring 1.6 x 1.3 cm. This is likely infectious or inflammatory given morphology, although metastatic disease is not strictly excluded. Attention on follow-up. 7. Coronary artery disease. Aortic Atherosclerosis (ICD10-I70.0) and Emphysema (ICD10-J43.9). Electronically Signed   By: Eddie Candle M.D.   On: 01/15/2021 09:11   PERIPHERAL VASCULAR CATHETERIZATION  Result Date: 01/15/2021 See surgical note for result.  VAS [place] ABI WITH/WO TBI  Result Date: 12/27/2020  LOWER EXTREMITY DOPPLER STUDY Patient Name:  JASMEET GEHL.  Date of Exam:   12/26/2020 Medical Rec #: [id number]            Accession #:    [id number] Date of Birth: 1940-09-23            Patient Gender: M Patient Age:   62Y Exam Location:  [facility] Procedure:      VAS [place] ABI WITH/WO TBI Referring Phys: [id number] GREGORY G SCHNIER --------------------------------------------------------------------------------  Indications: Peripheral artery disease, and worsening right calf pain. Other Factors: S/P Interventions.  Vascular Interventions: Bilat CIA/EIA stents; right sfa pop tp trunk stents. Performing Technologist: Concha Norway RVT  Examination Guidelines: A complete evaluation includes at minimum, Doppler waveform signals and systolic blood pressure reading at the level of bilateral brachial, anterior tibial, and posterior tibial arteries, when vessel segments are accessible. Bilateral testing is considered an integral part of a complete examination. Photoelectric Plethysmograph (PPG) waveforms and toe systolic pressure readings are included as required and additional duplex testing as needed. Limited examinations for reoccurring indications may be performed as noted.  ABI Findings: +---------+------------------+-----+----------+--------+ Right    Rt Pressure (mmHg)IndexWaveform  Comment  +---------+------------------+-----+----------+--------+ Brachial 135                                        +---------+------------------+-----+----------+--------+ ATA      69                0.51 monophasic         +---------+------------------+-----+----------+--------+ PTA      64                0.47 monophasic         +---------+------------------+-----+----------+--------+ Great Toe54                0.40 Abnormal           +---------+------------------+-----+----------+--------+ +---------+------------------+-----+--------+-------+ Left     Lt Pressure (mmHg)IndexWaveformComment +---------+------------------+-----+--------+-------+ Brachial 135                                    +---------+------------------+-----+--------+-------+ ATA      130               0.96 biphasic        +---------+------------------+-----+--------+-------+ PTA      131               0.97 biphasic        +---------+------------------+-----+--------+-------+ Great Toe116               0.86 Normal          +---------+------------------+-----+--------+-------+ +-------+-----------+-----------+------------+------------+ ABI/TBIToday's ABIToday's TBIPrevious ABIPrevious TBI +-------+-----------+-----------+------------+------------+ Right  .51        .40        .81         .50          +-------+-----------+-----------+------------+------------+ Left   .96        .86        .93         1.07         +-------+-----------+-----------+------------+------------+ Compared to prior study on 08/09/2020.  Summary: Right: Resting right ankle-brachial index indicates moderate right lower extremity arterial disease. The right toe-brachial index is abnormal. Left: Resting left ankle-brachial index is within normal range. No evidence of significant left lower extremity arterial disease. The left toe-brachial index is normal.  *See table(s) above for measurements and observations.  Electronically signed by Hortencia Pilar MD on 12/27/2020 at 5:07:04 PM.    Final    VAS [place] LOWER EXTREMITY ARTERIAL  DUPLEX  Result Date: 12/27/2020 LOWER EXTREMITY ARTERIAL DUPLEX STUDY Patient Name:  Jhonny Calixto.  Date  of Exam:   12/26/2020 Medical Rec #: [id number]            Accession #:    [id number] Date of Birth: 1940/11/01            Patient Gender: M Patient Age:   65Y Exam Location:  [facility] Procedure:      VAS [place] LOWER EXTREMITY ARTERIAL DUPLEX Referring Phys: Dolores Lory SCHNIER --------------------------------------------------------------------------------  Indications: Peripheral artery disease, and Worsening right leg calf              claudication > than 4 months ago.  Vascular Interventions: Bilat CIA/EIA stents. Right SFA/POP/TP trunk stents. Current ABI:            rt = .64 Comparison Study: 08/09/2020 Performing Technologist: Concha Norway RVT  Examination Guidelines: A complete evaluation includes B-mode imaging, spectral Doppler, color Doppler, and power Doppler as needed of all accessible portions of each vessel. Bilateral testing is considered an integral part of a complete examination. Limited examinations for reoccurring indications may be performed as noted.  +-----------+--------+-----+--------------+------------------+-----------------+ RIGHT      PSV cm/sRatioStenosis      Waveform          Comments          +-----------+--------+-----+--------------+------------------+-----------------+ CFA Mid    180                        monophasic                          +-----------+--------+-----+--------------+------------------+-----------------+ DFA        121                        monophasic                          +-----------+--------+-----+--------------+------------------+-----------------+ SFA Prox   61                         monophasic                          +-----------+--------+-----+--------------+------------------+-----------------+ SFA Mid    435          75-99%        monophasic        7.1 ratio; stent                           stenosis                        end stenosis      +-----------+--------+-----+--------------+------------------+-----------------+ SFA Distal 29                         dampened                                                                  monophasic                          +-----------+--------+-----+--------------+------------------+-----------------+  POP Distal 41                         monophasic                          +-----------+--------+-----+--------------+------------------+-----------------+ ATA Distal 7                          monophasic        near occlusion    +-----------+--------+-----+--------------+------------------+-----------------+ PTA Distal 43                         monophasic                          +-----------+--------+-----+--------------+------------------+-----------------+ PERO Distal46                         monophasic                          +-----------+--------+-----+--------------+------------------+-----------------+   Summary: Right: 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 This finding was seen previously but has worsened with a pre-stenotic velocity of 61 to a stenotic lesion velocity of 435 suggesting critical stenosis.  See table(s) above for measurements and observations. Electronically signed by Hortencia Pilar MD on 12/27/2020 at 5:07:08 PM.    Final      ASSESSMENT & PLAN:   Cancer of lower lobe of left lung ([place]) #Lung cancer-non-small cell stage IV [right suprarenal nodule-s/p biopsy "non-small cell ca"].  Currently on CarboTaxol plus Keytruda. CT AUG 5th, 2022-partial response [ subpleural mass of the superior segment left lower lobe is decreased in size. An adjacent nodule of the left lower lobe abutting the fissure is almost completely resolved;  Interval decrease in size of enlarged left hilar lymph node; Interval decrease in size of a  metastatic soft tissue nodule adjacent to the superior pole of the right kidney]  #Proceed with cycle #4-CarboTaxol Keytruda today. Labs today reviewed;  acceptable for treatment today.  Discussed after cycle #4 we will proceed with maintenance Keytruda every 3 weeks.  #Right upper lobe new groundglass opacity [AUG 8th CT chest]-suspect inflammatory.  Monitor for now.   # CAD-s/p CABG- STABLE; no decompensation   # PVD-s/p stent [Dr.Schneir]-improvement in pain noted.  On Plavix.  Stable  # Skin rash- back/torso- G-1-2-itching- not improved on hydrocortisone; add kenalog. Benadryl 25 mg PO qhs.   # DISPOSITION: #  chemo today; Fulphila-d-2 as planned.  # labs in 10 days- cbc/bmp;  # follow up in 3 weeks- MD; labs- cbc/cmp;Keytruda alone- Dr.B  # I reviewed the blood work- with the patient in detail; also reviewed the imaging independently [as summarized above]; and with the patient in detail.    All  questions were answered. The patient knows to call the clinic with any problems, questions or concerns.    Cammie Sickle, MD 01/24/2021 9:20 AM

## 2021-01-24 NOTE — Patient Instructions (Signed)
[name] ONCOLOGY  Discharge Instructions: Thank you for choosing [facility] to provide your oncology and hematology care.  If you have a lab appointment with the [facility], please go directly to the [facility] and check in at the registration area.  Wear comfortable clothing and clothing appropriate for easy access to any Portacath or PICC line.   We strive to give you quality time with your provider. You may need to reschedule your appointment if you arrive late (15 or more minutes).  Arriving late affects you and other patients whose appointments are after yours.  Also, if you miss three or more appointments without notifying the office, you may be dismissed from the clinic at the provider's discretion.      For prescription refill requests, have your pharmacy contact our office and allow 72 hours for refills to be completed.    Today you received the following chemotherapy and/or immunotherapy agents Keytruda, Taxol, & Carboplatin      To help prevent nausea and vomiting after your treatment, we encourage you to take your nausea medication as directed.  BELOW ARE SYMPTOMS THAT SHOULD BE REPORTED IMMEDIATELY: *FEVER GREATER THAN 100.4 F (38 C) OR HIGHER *CHILLS OR SWEATING *NAUSEA AND VOMITING THAT IS NOT CONTROLLED WITH YOUR NAUSEA MEDICATION *UNUSUAL SHORTNESS OF BREATH *UNUSUAL BRUISING OR BLEEDING *URINARY PROBLEMS (pain or burning when urinating, or frequent urination) *BOWEL PROBLEMS (unusual diarrhea, constipation, pain near the anus) TENDERNESS IN MOUTH AND THROAT WITH OR WITHOUT PRESENCE OF ULCERS (sore throat, sores in mouth, or a toothache) UNUSUAL RASH, SWELLING OR PAIN  UNUSUAL VAGINAL DISCHARGE OR ITCHING   Items with * indicate a potential emergency and should be followed up as soon as possible or go to the Emergency Department if any problems should occur.  Please show the CHEMOTHERAPY ALERT CARD or IMMUNOTHERAPY  ALERT CARD at check-in to the Emergency Department and triage nurse.  Should you have questions after your visit or need to cancel or reschedule your appointment, please contact [facility]  [phone] and follow the prompts.  Office hours are 8:00 a.m. to 4:30 p.m. Monday - Friday. Please note that voicemails left after 4:00 p.m. may not be returned until the following business day.  We are closed weekends and major holidays. You have access to a nurse at all times for urgent questions. Please call the main number to the clinic [phone] and follow the prompts.  For any non-urgent questions, you may also contact your provider using MyChart. We now offer e-Visits for anyone 31 and older to request care online for non-urgent symptoms. For details visit [email or web address].   Also download the MyChart app! Go to the app store, search "MyChart", open the app, select [facility], and log in with your MyChart username and password.  Due to Covid, a mask is required upon entering the hospital/clinic. If you do not have a mask, one will be given to you upon arrival. For doctor visits, patients may have 1 support person aged 64 or older with them. For treatment visits, patients cannot have anyone with them due to current Covid guidelines and our immunocompromised population.

## 2021-01-24 NOTE — Assessment & Plan Note (Addendum)
#  Lung cancer-non-small cell stage IV [right suprarenal nodule-s/p biopsy "non-small cell ca"].  Currently on CarboTaxol plus Keytruda. CT AUG 5th, 2022-partial response [ subpleural mass of the superior segment left lower lobe is decreased in size. An adjacent nodule of the left lower lobe abutting the fissure is almost completely resolved;  Interval decrease in size of enlarged left hilar lymph node; Interval decrease in size of a metastatic soft tissue nodule adjacent to the superior pole of the right kidney]  #Proceed with cycle #4-CarboTaxol Keytruda today. Labs today reviewed;  acceptable for treatment today.  Discussed after cycle #4 we will proceed with maintenance Keytruda every 3 weeks.  #Right upper lobe new groundglass opacity [AUG 8th CT chest]-suspect inflammatory.  Monitor for now.   # CAD-s/p CABG- STABLE; no decompensation   # PVD-s/p stent [Dr.Schneir]-improvement in pain noted.  On Plavix.  Stable  # Skin rash- back/torso- G-1-2-itching- not improved on hydrocortisone; add kenalog. Benadryl 25 mg PO qhs.   # DISPOSITION: #  chemo today; Fulphila-d-2 as planned.  # labs in 10 days- cbc/bmp;  # follow up in 3 weeks- MD; labs- cbc/cmp;Keytruda alone- Dr.B  # I reviewed the blood work- with the patient in detail; also reviewed the imaging independently [as summarized above]; and with the patient in detail.

## 2021-01-25 ENCOUNTER — Other Ambulatory Visit (INDEPENDENT_AMBULATORY_CARE_PROVIDER_SITE_OTHER): Payer: Self-pay | Admitting: Vascular Surgery

## 2021-01-25 ENCOUNTER — Inpatient Hospital Stay: Payer: PPO

## 2021-01-25 DIAGNOSIS — Z5112 Encounter for antineoplastic immunotherapy: Secondary | ICD-10-CM | POA: Diagnosis not present

## 2021-01-25 DIAGNOSIS — I70221 Atherosclerosis of native arteries of extremities with rest pain, right leg: Secondary | ICD-10-CM

## 2021-01-25 DIAGNOSIS — C3432 Malignant neoplasm of lower lobe, left bronchus or lung: Secondary | ICD-10-CM

## 2021-01-25 DIAGNOSIS — Z95820 Peripheral vascular angioplasty status with implants and grafts: Secondary | ICD-10-CM

## 2021-01-25 MED ORDER — PEGFILGRASTIM-JMDB 6 MG/0.6ML ~~LOC~~ SOSY
6.0000 mg | PREFILLED_SYRINGE | Freq: Once | SUBCUTANEOUS | Status: AC
  Administered 2021-01-25: 6 mg via SUBCUTANEOUS
  Filled 2021-01-25: qty 0.6

## 2021-01-28 ENCOUNTER — Telehealth: Payer: Self-pay | Admitting: Family Medicine

## 2021-01-28 NOTE — Progress Notes (Signed)
  Chronic Care Management   Note  01/28/2021 Name: Randall Ali. MRN: [id number] DOB: [birth year]  Randall Ali. is a 80 y.o. year old male who is a primary care patient of Ria Bush, MD. I reached out to [name] by phone today in response to a referral sent by Randall Ali PCP, Ria Bush, MD.   [name] was given information about Chronic Care Management services today including:  CCM service includes personalized support from designated clinical staff supervised by his physician, including individualized plan of care and coordination with other care providers 24/7 contact phone numbers for assistance for urgent and routine care needs. Service will only be billed when office clinical staff spend 20 minutes or more in a month to coordinate care. Only one practitioner may furnish and bill the service in a calendar month. The patient may stop CCM services at any time (effective at the end of the month) by phone call to the office staff.   Patient wishes to consider information provided and/or speak with a member of the care team before deciding about enrollment in care management services.   Follow up plan:   [place]ana Secretary/administrator

## 2021-01-31 ENCOUNTER — Other Ambulatory Visit: Payer: Self-pay | Admitting: *Deleted

## 2021-01-31 ENCOUNTER — Ambulatory Visit (INDEPENDENT_AMBULATORY_CARE_PROVIDER_SITE_OTHER): Payer: PPO

## 2021-01-31 ENCOUNTER — Other Ambulatory Visit: Payer: Self-pay

## 2021-01-31 ENCOUNTER — Ambulatory Visit (INDEPENDENT_AMBULATORY_CARE_PROVIDER_SITE_OTHER): Payer: PPO | Admitting: Vascular Surgery

## 2021-01-31 VITALS — BP 116/66 | HR 82 | Ht 68.0 in | Wt 213.0 lb

## 2021-01-31 DIAGNOSIS — Z95820 Peripheral vascular angioplasty status with implants and grafts: Secondary | ICD-10-CM | POA: Diagnosis not present

## 2021-01-31 DIAGNOSIS — E782 Mixed hyperlipidemia: Secondary | ICD-10-CM | POA: Diagnosis not present

## 2021-01-31 DIAGNOSIS — I70221 Atherosclerosis of native arteries of extremities with rest pain, right leg: Secondary | ICD-10-CM

## 2021-01-31 DIAGNOSIS — I6523 Occlusion and stenosis of bilateral carotid arteries: Secondary | ICD-10-CM

## 2021-01-31 DIAGNOSIS — C3432 Malignant neoplasm of lower lobe, left bronchus or lung: Secondary | ICD-10-CM

## 2021-01-31 DIAGNOSIS — I2510 Atherosclerotic heart disease of native coronary artery without angina pectoris: Secondary | ICD-10-CM | POA: Diagnosis not present

## 2021-01-31 DIAGNOSIS — I70229 Atherosclerosis of native arteries of extremities with rest pain, unspecified extremity: Secondary | ICD-10-CM

## 2021-02-03 ENCOUNTER — Encounter (INDEPENDENT_AMBULATORY_CARE_PROVIDER_SITE_OTHER): Payer: Self-pay | Admitting: Vascular Surgery

## 2021-02-03 NOTE — Progress Notes (Signed)
MRN : [id number]  Randall Ali. is a 80 y.o. ([birth year]) male who presents with chief complaint of follow up after stent.  History of Present Illness:   The patient returns to the office for followup and review status post angiogram with intervention. The patient notes improvement in the lower extremity symptoms. No interval shortening of the patient's claudication distance or rest pain symptoms. Previous wounds have now healed.  No new ulcers or wounds have occurred since the last visit.  There have been no significant changes to the patient's overall health care.  The patient denies amaurosis fugax or recent TIA symptoms. There are no recent neurological changes noted. The patient denies history of DVT, PE or superficial thrombophlebitis. The patient denies recent episodes of angina or shortness of breath.   ABI's Rt=1.10 and Lt=0.98  (previous ABI's Rt=0.51 and Lt=0.96)   Current Meds  Medication Sig   acetaminophen (TYLENOL) 325 MG tablet Take 650 mg by mouth as needed for moderate pain or mild pain.   Ascorbic Acid (VITAMIN C) 1000 MG tablet Take 1,000 mg by mouth daily.   aspirin 81 MG EC tablet Take 81 mg by mouth daily.     Calcium-Magnesium-Vitamin D (CALCIUM 1200+D3 PO) Take 1 tablet by mouth daily.   Carboxymethylcellul-Glycerin (LUBRICATING EYE DROPS OP) Place 1 drop into both eyes daily as needed (irritation).   Cholecalciferol (VITAMIN D3) 50 MCG (2000 UT) TABS Take 2,000 Units by mouth daily.   clopidogrel (PLAVIX) 75 MG tablet Take 1 tablet (75 mg total) by mouth daily.   Cyanocobalamin (B-12) 5000 MCG CAPS Take 5,000 mcg by mouth daily.   docusate sodium (COLACE) 100 MG capsule Take 200 mg by mouth at bedtime.   lidocaine-prilocaine (EMLA) cream Apply 30 -45 mins prior to port access.   lisinopril (ZESTRIL) 5 MG tablet TAKE 1 TABLET BY MOUTH DAILY   loratadine (CLARITIN) 10 MG tablet Take 10 mg by mouth daily.   Melatonin 10 MG CAPS Take 10 mg by mouth at  bedtime as needed (sleep).   metoprolol succinate (TOPROL-XL) 25 MG 24 hr tablet TAKE 1/2 TABLET (12.5 MG) BY MOUTH DAILY   Niacinamide-Zn-Cu-Methfo-Se-Cr (NICOTINAMIDE PO) Take 500 mg by mouth daily.   nitroGLYCERIN (NITROSTAT) 0.4 MG SL tablet Place 1 tablet (0.4 mg total) under the tongue every 5 (five) minutes as needed for up to 25 doses for chest pain.   ondansetron (ZOFRAN) 8 MG tablet One pill every 8 hours as needed for nausea/vomitting.   pantoprazole (PROTONIX) 40 MG tablet TAKE 1 TABLET BY MOUTH DAILY (Patient taking differently: Take 40 mg by mouth daily.)   Polyethyl Glycol-Propyl Glycol (LUBRICANT EYE DROPS) 0.4-0.3 % SOLN Place 1-2 drops into both eyes 3 (three) times daily as needed (burning eyes.).   prochlorperazine (COMPAZINE) 10 MG tablet Take 1 tablet (10 mg total) by mouth every 6 (six) hours as needed for nausea or vomiting.   simvastatin (ZOCOR) 40 MG tablet TAKE 1 TABLET BY MOUTH AT BEDTIME   traMADol (ULTRAM) 50 MG tablet TAKE 1 TABLET BY MOUTH 3 TIMES DAILY AS NEEDED FOR MODERATE PAIN   Vitamin E 450 MG (1000 UT) CAPS Take 450 Units by mouth daily.   Zinc 50 MG TABS Take 50 mg by mouth daily.    Past Medical History:  Diagnosis Date   Allergy    seasonal   Arthritis    all over- in general    CAD (coronary artery disease)    a. inferior wall  MI 10/01 s/p PCI/DES to RCA; b. Myoview 3/16 neg for ischemia; c. LHC 8/16: ostLAD 80%, OM1 70%, OM2 70% x 2 lesions, mRCA 30%, dRCA 70% s/p 4-V CABG 01/24/15 (LIMA-LAD, VG- OM1, VG-OM2, VG-PDA)    Cancer (HCC)    skin, melanoma   Carotid artery disease ([place])    a. [place] 8/16: 1-39% bilateral ICA stenosis   Cataract    removed   Diastolic dysfunction    a. TTE 8/16: EF 55-60%, no RWMA, Gr1DD, calcified mitral annulus, mild biatrial enlargement   Erectile dysfunction    GERD (gastroesophageal reflux disease)    History of elbow surgery    History of hiatal hernia    HLD (hyperlipidemia)    HTN (hypertension)    Inferior  myocardial infarction ([place]) 03/2000   stent RCA   Lung cancer ([place])    Postoperative wound infection 02/02/2015   Reflux esophagitis    Sleep apnea 2017   CPAP at night    Past Surgical History:  Procedure Laterality Date   arm surgery  2010   BROW LIFT Bilateral 11/25/2019   Procedure: BROW PTOSIS REPAIR BILATERAL;  Surgeon: Karle Starch, MD;  Location: [facility];  Service: Ophthalmology;  Laterality: Bilateral;  sleep apnea   CARDIAC CATHETERIZATION  06/24/2011   CARDIAC CATHETERIZATION N/A 01/18/2015   Procedure: Left Heart Cath with coronary angiography;  Surgeon: Minna Merritts, MD;  Location: [facility];  Service: Cardiovascular;  Laterality: N/A;   CARDIAC CATHETERIZATION N/A 01/18/2015   Procedure: Intravascular Pressure Wire/FFR Study;  Surgeon: [name], MD;  Location: [facility];  Service: Cardiovascular;  Laterality: N/A;   CAROTID STENT  03/10/2011   COLONOSCOPY  2010   COLONOSCOPY  06/14/2014   Dr Hilarie Fredrickson   CORONARY ARTERY BYPASS GRAFT N/A 01/24/2015   Procedure: CORONARY ARTERY BYPASS GRAFTING x 4 (LIMA-LAD, SVG-Int 1- Int 2, SVG-PD) ENDOSCOPIC GREATER SAPHENOUS VEIN HARVEST LEFT LEG;  Surgeon: [name], MD;  Location: [facility];  Service: Open Heart Surgery;  Laterality: N/A;   EMBOLECTOMY  06/15/2019   Procedure: EMBOLECTOMY;  Surgeon: Katha Cabal, MD;  Location: [facility];  Service: Vascular;;  right superficial femoral artery   ENDARTERECTOMY FEMORAL Right 06/15/2019   Procedure: ENDARTERECTOMY FEMORAL;  Surgeon: Katha Cabal, MD;  Location: [facility];  Service: Vascular;  Laterality: Right;  common femoral profunda femoris superficial femoral   ESOPHAGOGASTRODUODENOSCOPY (EGD) WITH PROPOFOL N/A 04/24/2016   Procedure: ESOPHAGOGASTRODUODENOSCOPY (EGD) WITH PROPOFOL;  Surgeon: Jerene Bears, MD;  Location: [facility];  Service: Gastroenterology;  Laterality: N/A;   EYE SURGERY     lasik 15 yrs. ago, cataracts  removed - both eyes    HAMMER TOE SURGERY     right toe   INSERTION OF ILIAC STENT Right 06/15/2019   Procedure: INSERTION OF ILIAC STENT ( STENTING OF SFA/POP ARTERY );  Surgeon: Katha Cabal, MD;  Location: [facility];  Service: Vascular;  Laterality: Right;  angioplpasty and stent placement: right superficial femoral right tibiopopliteal trunk bilateral common iliac arteries   IR IMAGING GUIDED PORT INSERTION  11/09/2020   LEFT HEART CATH AND CORONARY ANGIOGRAPHY Left 06/10/2017   Procedure: LEFT HEART CATH AND CORONARY ANGIOGRAPHY;  Surgeon: Minna Merritts, MD;  Location: [facility];  Service: Cardiovascular;  Laterality: Left;   LOWER EXTREMITY ANGIOGRAPHY Left 01/04/2019   Procedure: LOWER EXTREMITY ANGIOGRAPHY;  Surgeon: Katha Cabal, MD;  Location: [facility];  Service:  Cardiovascular;  Laterality: Left;   LOWER EXTREMITY ANGIOGRAPHY Right 01/25/2019   Procedure: LOWER EXTREMITY ANGIOGRAPHY;  Surgeon: Katha Cabal, MD;  Location: [facility];  Service: Cardiovascular;  Laterality: Right;   LOWER EXTREMITY ANGIOGRAPHY Right 01/15/2021   LOWER EXTREMITY ANGIOGRAPHY and stent placement to R SFA and popliteal artery Delana Meyer, Dolores Lory, MD)   NASAL SINUS SURGERY  2008   septpolasty, bilateral turbinate reduction   SHOULDER ARTHROSCOPY  2012   TEE WITHOUT CARDIOVERSION N/A 01/24/2015   Procedure: TRANSESOPHAGEAL ECHOCARDIOGRAM (TEE);  Surgeon: [name], MD;  Location: [facility];  Service: Open Heart Surgery;  Laterality: N/A;   TOE SURGERY  1994   UPPER GI ENDOSCOPY  07/2014, 04-24-16   Dr Raquel James   WRIST SURGERY  2011    Social History Social History   Tobacco Use   Smoking status: Former    Packs/day: 2.50    Years: 40.00    Pack years: 100.00    Types: Cigarettes    Quit date: 03/24/2000    Years since quitting: 20.8   Smokeless tobacco: Never  Vaping Use   Vaping Use: Never used  Substance Use Topics   Alcohol use:  Yes    Comment: occassionally   Drug use: No    Family History Family History  Problem Relation Age of Onset   Hypertension Mother    Heart disease Mother    Hypertension Father    Diabetes Father    Lymphoma Sister    Heart disease Brother 27   Cancer Paternal Grandfather    Colon cancer Neg Hx    Prostate cancer Neg Hx    Bladder Cancer Neg Hx    Kidney cancer Neg Hx     Allergies  Allergen Reactions   Flomax [Tamsulosin] Other (See Comments)    Orthostatic dizziness     REVIEW OF SYSTEMS (Negative unless checked)  Constitutional: [] Weight loss  [] Fever  [] Chills Cardiac: [] Chest pain   [] Chest pressure   [] Palpitations   [] Shortness of breath when laying flat   [] Shortness of breath with exertion. Vascular:  [] Pain in legs with walking   [] Pain in legs at rest  [] History of DVT   [] Phlebitis   [] Swelling in legs   [] Varicose veins   [] Non-healing ulcers Pulmonary:   [] Uses home oxygen   [] Productive cough   [] Hemoptysis   [] Wheeze  [] COPD   [] Asthma Neurologic:  [] Dizziness   [] Seizures   [] History of stroke   [] History of TIA  [] Aphasia   [] Vissual changes   [] Weakness or numbness in arm   [] Weakness or numbness in leg Musculoskeletal:   [] Joint swelling   [] Joint pain   [] Low back pain Hematologic:  [] Easy bruising  [] Easy bleeding   [] Hypercoagulable state   [] Anemic Gastrointestinal:  [] Diarrhea   [] Vomiting  [] Gastroesophageal reflux/heartburn   [] Difficulty swallowing. Genitourinary:  [] Chronic kidney disease   [] Difficult urination  [] Frequent urination   [] Blood in urine Skin:  [] Rashes   [] Ulcers  Psychological:  [] History of anxiety   []  History of major depression.  Physical Examination  Vitals:   01/31/21 1541  BP: 116/66  Pulse: 82  Weight: 213 lb (96.6 kg)  Height: 5\' 8"  (1.727 m)   Body mass index is 32.39 kg/m. Gen: WD/WN, NAD Head: [place]/AT, No temporalis wasting.  Ear/Nose/Throat: Hearing grossly intact, nares w/o erythema or drainage Eyes:  PER, EOMI, sclera nonicteric.  Neck: Supple, no masses.  No bruit or JVD.  Pulmonary:  Good  air movement, no audible wheezing, no use of accessory muscles.  Cardiac: RRR, normal S1, S2, no Murmurs. Vascular:   Vessel Right Left  Radial Palpable Palpable  PT Palpable Palpable  DP Palpable Palpable  Gastrointestinal: soft, non-distended. No guarding/no peritoneal signs.  Musculoskeletal: M/S 5/5 throughout.  No visible deformity.  Neurologic: CN 2-12 intact. Pain and light touch intact in extremities.  Symmetrical.  Speech is fluent. Motor exam as listed above. Psychiatric: Judgment intact, Mood & affect appropriate for pt's clinical situation. Dermatologic: No rashes or ulcers noted.  No changes consistent with cellulitis.   CBC Lab Results  Component Value Date   WBC 5.8 01/24/2021   HGB 13.5 01/24/2021   HCT 40.2 01/24/2021   MCV 93.1 01/24/2021   PLT 154 01/24/2021    BMET    Component Value Date/Time   NA 136 01/24/2021 0828   NA 138 01/15/2015 0956   K 4.2 01/24/2021 0828   CL 104 01/24/2021 0828   CO2 24 01/24/2021 0828   GLUCOSE 101 (H) 01/24/2021 0828   BUN 24 (H) 01/24/2021 0828   BUN 16 01/15/2015 0956   CREATININE 1.01 01/24/2021 0828   CALCIUM 9.0 01/24/2021 0828   GFRNONAA >60 01/24/2021 0828   GFRAA >60 06/16/2019 1126   Estimated Creatinine Clearance: 68 mL/min (by C-G formula based on SCr of 1.01 mg/dL).  COAG Lab Results  Component Value Date   INR 1.1 11/06/2020   INR 1.1 10/18/2020   INR 1.1 10/12/2020    Radiology CT CHEST ABDOMEN PELVIS W CONTRAST  Result Date: 01/15/2021 CLINICAL DATA:  Metastatic left lung cancer restaging, biopsy proven metastatic nodule adjacent to the superior pole of the right kidney, assess treatment response EXAM: CT CHEST, ABDOMEN, AND PELVIS WITH CONTRAST TECHNIQUE: Multidetector CT imaging of the chest, abdomen and pelvis was performed following the standard protocol during bolus administration of intravenous  contrast. CONTRAST:  126mL OMNIPAQUE IOHEXOL 350 MG/ML SOLN, additional oral enteric contrast COMPARISON:  MR abdomen, 11/19/2020, PET-CT, 10/17/2020, CT chest, 10/08/2020 FINDINGS: CT CHEST FINDINGS Cardiovascular: Right chest port catheter. Aortic atherosclerosis. Normal heart size. Three-vessel coronary artery calcifications. No pericardial effusion. Mediastinum/Nodes: Previously seen enlarged posterior left hilar lymph node is reduced in size, measuring 1.2 x 0.9 cm, previously 1.7 x 1.2 cm (series 2, image 32). No residual enlarged mediastinal, hilar, or axillary lymph nodes. Thyroid gland, trachea, and esophagus demonstrate no significant findings. Lungs/Pleura: Mild centrilobular and paraseptal emphysema. Diffuse bilateral bronchial wall thickening.Subpleural mass of the superior segment left lower lobe is decreased in size, measuring 3.0 x 2.2 cm, previously 4.0 x 3.0 cm (series 3, image 76). An adjacent nodule abutting the fissure is almost completely resolved, measuring no greater than 2 mm (series 3, image 87). There is a new irregular nodule or consolidation of the dependent right upper lobe measuring 1.6 x 1.3 cm (series 3, image 73). Stable, partially calcified benign incidental nodule of the medial right upper lobe measuring 6 mm (series 3, image 58). Unchanged minimal dependent bibasilar scarring and or atelectasis. No pleural effusion or pneumothorax. Musculoskeletal: No chest wall mass or suspicious bone lesions identified. CT ABDOMEN PELVIS FINDINGS Hepatobiliary: No solid liver abnormality is seen. Multiple low-attenuation cysts of the liver, previously better characterized by MRI. No gallstones, gallbladder wall thickening, or biliary dilatation. Pancreas: Unremarkable. No pancreatic ductal dilatation or surrounding inflammatory changes. Spleen: Normal in size without significant abnormality. Adrenals/Urinary Tract: Adrenal glands are unremarkable. Interval decrease in size of a metastatic soft  tissue nodule adjacent  to the superior pole of the right kidney, in the hepatorenal fossa, measuring 2.2 x 2.0 cm, previously 3.7 x 3.2 cm (series 2, image 63). Small nonobstructive left renal calculi. No right-sided calculi, ureteral calculi, or hydronephrosis. The bladder is normal. Stomach/Bowel: Stomach is within normal limits. Appendix appears normal. No evidence of bowel wall thickening, distention, or inflammatory changes. Large burden of stool throughout the colon and rectum. Vascular/Lymphatic: Aortic atherosclerosis. No enlarged abdominal or pelvic lymph nodes. Reproductive: No mass or other abnormality. Other: Fat containing left inguinal hernia. No abdominopelvic ascites. Musculoskeletal: No acute or significant osseous findings. IMPRESSION: 1. Subpleural mass of the superior segment left lower lobe is decreased in size. 2. An adjacent nodule of the left lower lobe abutting the fissure is almost completely resolved. 3. Interval decrease in size of enlarged left hilar lymph node. 4. Interval decrease in size of a metastatic soft tissue nodule adjacent to the superior pole of the right kidney. 5. Constellation of findings is consistent with treatment response. 6. There is a new irregular nodule or consolidation of the dependent right upper lobe measuring 1.6 x 1.3 cm. This is likely infectious or inflammatory given morphology, although metastatic disease is not strictly excluded. Attention on follow-up. 7. Coronary artery disease. Aortic Atherosclerosis (ICD10-I70.0) and Emphysema (ICD10-J43.9). Electronically Signed   By: Eddie Candle M.D.   On: 01/15/2021 09:11   PERIPHERAL VASCULAR CATHETERIZATION  Result Date: 01/15/2021 See surgical note for result.  VAS [place] ABI WITH/WO TBI  Result Date: 01/31/2021  LOWER EXTREMITY DOPPLER STUDY Patient Name:  Randall Ali.  Date of Exam:   01/31/2021 Medical Rec #: [id number]            Accession #:    [id number] Date of Birth: June 03, 1941            Patient  Gender: M Patient Age:   43 years Exam Location:  [facility] Procedure:      VAS [place] ABI WITH/WO TBI Referring Phys: [facility] --------------------------------------------------------------------------------  Indications: Peripheral artery disease, and worsening right calf pain. Other Factors: S/P Interventions.  Vascular Interventions: 01/15/2021 PTA and stent of Rt SFA and popliteal artery. Comparison Study: 12/26/2020 Performing Technologist: Charlane Ferretti RT (R)(VS)  Examination Guidelines: A complete evaluation includes at minimum, Doppler waveform signals and systolic blood pressure reading at the level of bilateral brachial, anterior tibial, and posterior tibial arteries, when vessel segments are accessible. Bilateral testing is considered an integral part of a complete examination. Photoelectric Plethysmograph (PPG) waveforms and toe systolic pressure readings are included as required and additional duplex testing as needed. Limited examinations for reoccurring indications may be performed as noted.  ABI Findings: +---------+------------------+-----+---------+--------+ Right    Rt Pressure (mmHg)IndexWaveform Comment  +---------+------------------+-----+---------+--------+ Brachial 124                                      +---------+------------------+-----+---------+--------+ ATA      119                    biphasic          +---------+------------------+-----+---------+--------+ PTA      137               1.10 triphasic         +---------+------------------+-----+---------+--------+ Great Toe108               0.87 Normal            +---------+------------------+-----+---------+--------+ +---------+------------------+-----+---------+-------+  Left     Lt Pressure (mmHg)IndexWaveform Comment +---------+------------------+-----+---------+-------+ Brachial 124                                     +---------+------------------+-----+---------+-------+  ATA      116                    biphasic         +---------+------------------+-----+---------+-------+ PTA      11                0.09 triphasic        +---------+------------------+-----+---------+-------+ Great Toe94                0.76 Normal           +---------+------------------+-----+---------+-------+ +-------+-----------+-----------+------------+------------+ ABI/TBIToday's ABIToday's TBIPrevious ABIPrevious TBI +-------+-----------+-----------+------------+------------+ Right  1.10       .87        .51         .40          +-------+-----------+-----------+------------+------------+ Left   .98        .76        .96         .86          +-------+-----------+-----------+------------+------------+ Right ABIs appear increased compared to prior study on 12/26/2020. Left ABIs appear essentially unchanged compared to prior study on 12/26/2020.  Summary: Right: Resting right ankle-brachial index is within normal range. No evidence of significant right lower extremity arterial disease. The right toe-brachial index is normal. Right TBI appears increased as compared to the previous exam on 12/26/2020. Left: Resting left ankle-brachial index is within normal range. No evidence of significant left lower extremity arterial disease. The left toe-brachial index is normal. Left TBI appears essentially unchanged as compared to the previous exam on 12/26/2020.  *See table(s) above for measurements and observations.  Electronically signed by Hortencia Pilar MD on 01/31/2021 at 4:23:18 PM.    Final      Assessment/Plan 1. Atherosclerosis of artery of extremity with rest pain ([place]) Recommend:  The patient is status post successful angiogram with intervention.  The patient reports that the claudication symptoms and leg pain is essentially gone.   The patient denies lifestyle limiting changes at this point in time.  No further invasive studies, angiography or surgery at this time The  patient should continue walking and begin a more formal exercise program.  The patient should continue antiplatelet therapy and aggressive treatment of the lipid abnormalities  Smoking cessation was again discussed  The patient should continue wearing graduated compression socks 10-15 mmHg strength to control the mild edema.  Patient should undergo noninvasive studies as ordered. The patient will follow up with me after the studies.   - VAS [place] LOWER EXTREMITY ARTERIAL DUPLEX; Future - VAS [place] ABI WITH/WO TBI; Future  2. Carotid stenosis, asymptomatic, bilateral Recommend:  Given the patient's asymptomatic subcritical stenosis no further invasive testing or surgery at this time.  Continue antiplatelet therapy as prescribed Continue management of CAD, HTN and Hyperlipidemia Healthy heart diet,  encouraged exercise at least 4 times per week Follow up in 12 months with duplex ultrasound and physical exam    3. Atherosclerosis of native coronary artery of native heart without angina pectoris Continue cardiac and antihypertensive medications as already ordered and reviewed, no changes at this time.  Continue statin as ordered and reviewed, no changes at  this time  Nitrates PRN for chest pain   4. Mixed hyperlipidemia Continue statin as ordered and reviewed, no changes at this time     Hortencia Pilar, MD  02/03/2021 11:30 AM

## 2021-02-04 ENCOUNTER — Other Ambulatory Visit: Payer: Self-pay

## 2021-02-04 ENCOUNTER — Other Ambulatory Visit: Payer: Self-pay | Admitting: Family Medicine

## 2021-02-04 ENCOUNTER — Inpatient Hospital Stay: Payer: PPO

## 2021-02-04 DIAGNOSIS — Z5112 Encounter for antineoplastic immunotherapy: Secondary | ICD-10-CM | POA: Diagnosis not present

## 2021-02-04 DIAGNOSIS — C3432 Malignant neoplasm of lower lobe, left bronchus or lung: Secondary | ICD-10-CM

## 2021-02-04 LAB — COMPREHENSIVE METABOLIC PANEL
ALT: 17 U/L (ref 0–44)
AST: 17 U/L (ref 15–41)
Albumin: 4.2 g/dL (ref 3.5–5.0)
Alkaline Phosphatase: 93 U/L (ref 38–126)
Anion gap: 7 (ref 5–15)
BUN: 22 mg/dL (ref 8–23)
CO2: 23 mmol/L (ref 22–32)
Calcium: 8.8 mg/dL — ABNORMAL LOW (ref 8.9–10.3)
Chloride: 104 mmol/L (ref 98–111)
Creatinine, Ser: 0.87 mg/dL (ref 0.61–1.24)
GFR, Estimated: >60 mL/min (ref >60)
Glucose, Bld: 108 mg/dL — ABNORMAL HIGH (ref 70–99)
Potassium: 4.2 mmol/L (ref 3.5–5.1)
Sodium: 134 mmol/L — ABNORMAL LOW (ref 135–145)
Total Bilirubin: 0.3 mg/dL (ref 0.3–1.2)
Total Protein: 6.7 g/dL (ref 6.5–8.1)

## 2021-02-04 LAB — CBC WITH DIFFERENTIAL/PLATELET
Abs Immature Granulocytes: 1.81 10*3/uL — ABNORMAL HIGH (ref 0.00–0.07)
Basophils Absolute: 0 10*3/uL (ref 0.0–0.1)
Basophils Relative: 0 %
Eosinophils Absolute: 0.1 10*3/uL (ref 0.0–0.5)
Eosinophils Relative: 0 %
HCT: 38.4 % — ABNORMAL LOW (ref 39.0–52.0)
Hemoglobin: 12.9 g/dL — ABNORMAL LOW (ref 13.0–17.0)
Immature Granulocytes: 9 %
Lymphocytes Relative: 11 %
Lymphs Abs: 2.1 10*3/uL (ref 0.7–4.0)
MCH: 31.5 pg (ref 26.0–34.0)
MCHC: 33.6 g/dL (ref 30.0–36.0)
MCV: 93.7 fL (ref 80.0–100.0)
Monocytes Absolute: 1.2 10*3/uL — ABNORMAL HIGH (ref 0.1–1.0)
Monocytes Relative: 6 %
Neutro Abs: 14.9 10*3/uL — ABNORMAL HIGH (ref 1.7–7.7)
Neutrophils Relative %: 74 %
Platelets: 202 10*3/uL (ref 150–400)
RBC: 4.1 MIL/uL — ABNORMAL LOW (ref 4.22–5.81)
RDW: 14.7 % (ref 11.5–15.5)
Smear Review: NORMAL
WBC: 20.2 10*3/uL — ABNORMAL HIGH (ref 4.0–10.5)
nRBC: 0.1 % (ref 0.0–0.2)

## 2021-02-05 ENCOUNTER — Telehealth: Payer: Self-pay

## 2021-02-05 DIAGNOSIS — M9903 Segmental and somatic dysfunction of lumbar region: Secondary | ICD-10-CM | POA: Diagnosis not present

## 2021-02-05 DIAGNOSIS — M955 Acquired deformity of pelvis: Secondary | ICD-10-CM | POA: Diagnosis not present

## 2021-02-05 DIAGNOSIS — M62830 Muscle spasm of back: Secondary | ICD-10-CM | POA: Diagnosis not present

## 2021-02-05 DIAGNOSIS — M9905 Segmental and somatic dysfunction of pelvic region: Secondary | ICD-10-CM | POA: Diagnosis not present

## 2021-02-05 NOTE — Telephone Encounter (Signed)
Called pt he is scheduled for labs on 10/19

## 2021-02-05 NOTE — Telephone Encounter (Signed)
Name of Medication: Tramadol Name of Pharmacy: [facility] or Written Date and Quantity: 01/07/21, #90 Last Office Visit and Type: 10/02/20, 6 mo f/u Next Office Visit and Type: 04/03/21, AWV Last Controlled Substance Agreement Date: none Last UDS: 07/20/16

## 2021-02-05 NOTE — Telephone Encounter (Signed)
Pt scheduled for AWV on 04/03/21.  Plz schedule labs before this visit.

## 2021-02-06 ENCOUNTER — Other Ambulatory Visit: Payer: Self-pay | Admitting: Family Medicine

## 2021-02-06 NOTE — Telephone Encounter (Signed)
Refill sent today  Duplicate request

## 2021-02-06 NOTE — Telephone Encounter (Signed)
ERx 

## 2021-02-07 DIAGNOSIS — M955 Acquired deformity of pelvis: Secondary | ICD-10-CM | POA: Diagnosis not present

## 2021-02-07 DIAGNOSIS — M9903 Segmental and somatic dysfunction of lumbar region: Secondary | ICD-10-CM | POA: Diagnosis not present

## 2021-02-07 DIAGNOSIS — M9905 Segmental and somatic dysfunction of pelvic region: Secondary | ICD-10-CM | POA: Diagnosis not present

## 2021-02-07 DIAGNOSIS — M62830 Muscle spasm of back: Secondary | ICD-10-CM | POA: Diagnosis not present

## 2021-02-07 NOTE — Telephone Encounter (Signed)
ERx 

## 2021-02-12 ENCOUNTER — Telehealth: Payer: Self-pay | Admitting: *Deleted

## 2021-02-12 ENCOUNTER — Other Ambulatory Visit: Payer: Self-pay | Admitting: *Deleted

## 2021-02-12 ENCOUNTER — Other Ambulatory Visit: Payer: Self-pay

## 2021-02-12 ENCOUNTER — Other Ambulatory Visit: Payer: PPO

## 2021-02-12 DIAGNOSIS — E860 Dehydration: Secondary | ICD-10-CM

## 2021-02-12 DIAGNOSIS — C3432 Malignant neoplasm of lower lobe, left bronchus or lung: Secondary | ICD-10-CM

## 2021-02-12 NOTE — Telephone Encounter (Signed)
Patient called reporting that he has not felt good in 3 weeks and thinks he may be dehydrated. What needs to be done?

## 2021-02-12 NOTE — Telephone Encounter (Signed)
Spoke with patient. He is very weak. He denies any symptoms of nausea/vomiting or diarrhea. He reports decrease appetite. He is requesting fluids prior to his [place] this week. Apts made tomorrow. He was instructed to arrive at 9 am for his port to be accessed. He will see Josh, NP and receive possible IV fluids.

## 2021-02-13 ENCOUNTER — Inpatient Hospital Stay: Payer: PPO | Attending: Internal Medicine

## 2021-02-13 ENCOUNTER — Inpatient Hospital Stay (HOSPITAL_BASED_OUTPATIENT_CLINIC_OR_DEPARTMENT_OTHER): Payer: PPO | Admitting: Hospice and Palliative Medicine

## 2021-02-13 ENCOUNTER — Inpatient Hospital Stay: Payer: PPO

## 2021-02-13 ENCOUNTER — Other Ambulatory Visit: Payer: Self-pay

## 2021-02-13 VITALS — Wt 215.0 lb

## 2021-02-13 VITALS — BP 120/61 | HR 78 | Temp 97.6°F | Resp 18

## 2021-02-13 DIAGNOSIS — Z7952 Long term (current) use of systemic steroids: Secondary | ICD-10-CM | POA: Insufficient documentation

## 2021-02-13 DIAGNOSIS — E785 Hyperlipidemia, unspecified: Secondary | ICD-10-CM | POA: Insufficient documentation

## 2021-02-13 DIAGNOSIS — I252 Old myocardial infarction: Secondary | ICD-10-CM | POA: Diagnosis not present

## 2021-02-13 DIAGNOSIS — Z7902 Long term (current) use of antithrombotics/antiplatelets: Secondary | ICD-10-CM | POA: Diagnosis not present

## 2021-02-13 DIAGNOSIS — E860 Dehydration: Secondary | ICD-10-CM

## 2021-02-13 DIAGNOSIS — Z79899 Other long term (current) drug therapy: Secondary | ICD-10-CM | POA: Diagnosis not present

## 2021-02-13 DIAGNOSIS — C3432 Malignant neoplasm of lower lobe, left bronchus or lung: Secondary | ICD-10-CM | POA: Insufficient documentation

## 2021-02-13 DIAGNOSIS — G4730 Sleep apnea, unspecified: Secondary | ICD-10-CM | POA: Diagnosis not present

## 2021-02-13 DIAGNOSIS — Z87891 Personal history of nicotine dependence: Secondary | ICD-10-CM | POA: Insufficient documentation

## 2021-02-13 DIAGNOSIS — R5383 Other fatigue: Secondary | ICD-10-CM

## 2021-02-13 DIAGNOSIS — R2232 Localized swelling, mass and lump, left upper limb: Secondary | ICD-10-CM | POA: Diagnosis not present

## 2021-02-13 DIAGNOSIS — Z7982 Long term (current) use of aspirin: Secondary | ICD-10-CM | POA: Diagnosis not present

## 2021-02-13 DIAGNOSIS — I10 Essential (primary) hypertension: Secondary | ICD-10-CM | POA: Diagnosis not present

## 2021-02-13 LAB — CBC WITH DIFFERENTIAL/PLATELET
Abs Immature Granulocytes: 0.07 10*3/uL (ref 0.00–0.07)
Basophils Absolute: 0.1 10*3/uL (ref 0.0–0.1)
Basophils Relative: 1 %
Eosinophils Absolute: 0 10*3/uL (ref 0.0–0.5)
Eosinophils Relative: 0 %
HCT: 38.6 % — ABNORMAL LOW (ref 39.0–52.0)
Hemoglobin: 13 g/dL (ref 13.0–17.0)
Immature Granulocytes: 1 %
Lymphocytes Relative: 13 %
Lymphs Abs: 1.4 10*3/uL (ref 0.7–4.0)
MCH: 31.7 pg (ref 26.0–34.0)
MCHC: 33.7 g/dL (ref 30.0–36.0)
MCV: 94.1 fL (ref 80.0–100.0)
Monocytes Absolute: 1.1 10*3/uL — ABNORMAL HIGH (ref 0.1–1.0)
Monocytes Relative: 11 %
Neutro Abs: 7.8 10*3/uL — ABNORMAL HIGH (ref 1.7–7.7)
Neutrophils Relative %: 74 %
Platelets: 257 10*3/uL (ref 150–400)
RBC: 4.1 MIL/uL — ABNORMAL LOW (ref 4.22–5.81)
RDW: 14.6 % (ref 11.5–15.5)
WBC: 10.4 10*3/uL (ref 4.0–10.5)
nRBC: 0 % (ref 0.0–0.2)

## 2021-02-13 LAB — COMPREHENSIVE METABOLIC PANEL
ALT: 12 U/L (ref 0–44)
AST: 15 U/L (ref 15–41)
Albumin: 4.1 g/dL (ref 3.5–5.0)
Alkaline Phosphatase: 66 U/L (ref 38–126)
Anion gap: 7 (ref 5–15)
BUN: 24 mg/dL — ABNORMAL HIGH (ref 8–23)
CO2: 23 mmol/L (ref 22–32)
Calcium: 8.8 mg/dL — ABNORMAL LOW (ref 8.9–10.3)
Chloride: 101 mmol/L (ref 98–111)
Creatinine, Ser: 0.93 mg/dL (ref 0.61–1.24)
GFR, Estimated: >60 mL/min (ref >60)
Glucose, Bld: 107 mg/dL — ABNORMAL HIGH (ref 70–99)
Potassium: 4.5 mmol/L (ref 3.5–5.1)
Sodium: 131 mmol/L — ABNORMAL LOW (ref 135–145)
Total Bilirubin: 0.4 mg/dL (ref 0.3–1.2)
Total Protein: 7 g/dL (ref 6.5–8.1)

## 2021-02-13 LAB — VITAMIN B12: Vitamin B-12: 1651 pg/mL — ABNORMAL HIGH (ref 180–914)

## 2021-02-13 LAB — VITAMIN D 25 HYDROXY (VIT D DEFICIENCY, FRACTURES): Vit D, 25-Hydroxy: 38.81 ng/mL (ref 30–100)

## 2021-02-13 LAB — TSH: TSH: 1.156 u[IU]/mL (ref 0.350–4.500)

## 2021-02-13 MED ORDER — SODIUM CHLORIDE 0.9% FLUSH
10.0000 mL | Freq: Once | INTRAVENOUS | Status: AC
  Administered 2021-02-13: 10 mL via INTRAVENOUS
  Filled 2021-02-13: qty 10

## 2021-02-13 MED ORDER — HEPARIN SOD (PORK) LOCK FLUSH 100 UNIT/ML IV SOLN
500.0000 [IU] | Freq: Once | INTRAVENOUS | Status: AC
  Administered 2021-02-13: 500 [IU] via INTRAVENOUS
  Filled 2021-02-13: qty 5

## 2021-02-13 MED ORDER — SODIUM CHLORIDE 0.9 % IV SOLN
INTRAVENOUS | Status: DC
  Filled 2021-02-13 (×2): qty 250

## 2021-02-13 NOTE — Progress Notes (Signed)
Pt reports not feeling well for 3 weeks. States that he has no energy. Endorses a good appetite and is drinking fluids. Also has noticed a "knot" that appeared on his left forearm over the weekend. Reports that it is a "little sore".

## 2021-02-13 NOTE — Progress Notes (Addendum)
They like  [facility]  Telephone:([phone]) [phone] Fax:(336) [phone]  Patient Care Team: Ria Bush, MD as PCP - General (Family Medicine) [name] [name], MD as PCP - Cardiology (Cardiology) [name], MD (Internal Medicine) Minna Merritts, MD as Consulting Physician (Cardiology) Pieter Partridge, DO as Consulting Physician (Neurology) Cammie Sickle, MD as Consulting Physician (Hematology and Oncology) Telford Nab, RN as Oncology Nurse Navigator   Name of the patient: Randall Ali  [id number]  Apr 24, 1941   Date of visit: 02/13/21  Reason for Consult:  Randall Ali is a 80 year old male with multiple medical problems including CAD status post CABG, PVD status post stenting, stage IV non-small cell lung cancer currently initially diagnosed May 2022 on treatment with CarboTaxol plus Keytruda.  Last CT January 11, 2021 revealed partial response to treatment with reduced size left lower lobe mass.  Patient last saw Dr. Rogue Bussing on 01/24/2021 at which time patient seemed to be at his baseline.  He complained of fatigue posttreatment and improved leg pain status post stenting.  Patient received cycle 4 carbo Taxol Keytruda.  Patient presents to [facility] today for evaluation of fatigue following last chemotherapy.  Patient reports sleeping well.  No daytime napping.  Patient is still able to engage in activities he finds enjoyable.  He is able to conduct all of his own self-care.  He is still driving.  No changes in appetite.  No depression or anxiety reported.  Patient reports a "knot" on his left forearm that he noticed 4 days ago.  It was initially painful but not red.  He wondered if he was bit by an insect.  He reports that the pain has improved significantly.  However, the knot is still present.  No rashes reported.  No fever or chills.  Denies any neurologic complaints. Denies recent fevers or illnesses. Denies any  easy bleeding or bruising. Reports good appetite and denies weight loss. Denies chest pain. Denies any nausea, vomiting, constipation, or diarrhea. Denies urinary complaints. Patient offers no further specific complaints today.  PAST MEDICAL HISTORY: Past Medical History:  Diagnosis Date   Allergy    seasonal   Arthritis    all over- in general    CAD (coronary artery disease)    a. inferior wall MI 10/01 s/p PCI/DES to RCA; b. Myoview 3/16 neg for ischemia; c. LHC 8/16: ostLAD 80%, OM1 70%, OM2 70% x 2 lesions, mRCA 30%, dRCA 70% s/p 4-V CABG 01/24/15 (LIMA-LAD, VG- OM1, VG-OM2, VG-PDA)    Cancer (HCC)    skin, melanoma   Carotid artery disease ([place])    a. [place] 8/16: 1-39% bilateral ICA stenosis   Cataract    removed   Diastolic dysfunction    a. TTE 8/16: EF 55-60%, no RWMA, Gr1DD, calcified mitral annulus, mild biatrial enlargement   Erectile dysfunction    GERD (gastroesophageal reflux disease)    History of elbow surgery    History of hiatal hernia    HLD (hyperlipidemia)    HTN (hypertension)    Inferior myocardial infarction ([place]) 03/2000   stent RCA   Lung cancer ([place])    Postoperative wound infection 02/02/2015   Reflux esophagitis    Sleep apnea 2017   CPAP at night    PAST SURGICAL HISTORY:  Past Surgical History:  Procedure Laterality Date   arm surgery  2010   BROW LIFT Bilateral 11/25/2019   Procedure: BROW PTOSIS REPAIR BILATERAL;  Surgeon: Karle Starch, MD;  Location: [facility];  Service: Ophthalmology;  Laterality: Bilateral;  sleep apnea   CARDIAC CATHETERIZATION  06/24/2011   CARDIAC CATHETERIZATION N/A 01/18/2015   Procedure: Left Heart Cath with coronary angiography;  Surgeon: Minna Merritts, MD;  Location: [facility] [facility];  Service: Cardiovascular;  Laterality: N/A;   CARDIAC CATHETERIZATION N/A 01/18/2015   Procedure: Intravascular Pressure Wire/FFR Study;  Surgeon: [name], MD;  Location: [place] [facility];  Service:  Cardiovascular;  Laterality: N/A;   CAROTID STENT  03/10/2011   COLONOSCOPY  2010   COLONOSCOPY  06/14/2014   Dr Hilarie Fredrickson   CORONARY ARTERY BYPASS GRAFT N/A 01/24/2015   Procedure: CORONARY ARTERY BYPASS GRAFTING x 4 (LIMA-LAD, SVG-Int 1- Int 2, SVG-PD) ENDOSCOPIC GREATER SAPHENOUS VEIN HARVEST LEFT LEG;  Surgeon: [name], MD;  Location: [facility];  Service: Open Heart Surgery;  Laterality: N/A;   EMBOLECTOMY  06/15/2019   Procedure: EMBOLECTOMY;  Surgeon: Katha Cabal, MD;  Location: [facility];  Service: Vascular;;  right superficial femoral artery   ENDARTERECTOMY FEMORAL Right 06/15/2019   Procedure: ENDARTERECTOMY FEMORAL;  Surgeon: Katha Cabal, MD;  Location: [facility];  Service: Vascular;  Laterality: Right;  common femoral profunda femoris superficial femoral   ESOPHAGOGASTRODUODENOSCOPY (EGD) WITH PROPOFOL N/A 04/24/2016   Procedure: ESOPHAGOGASTRODUODENOSCOPY (EGD) WITH PROPOFOL;  Surgeon: Jerene Bears, MD;  Location: [facility];  Service: Gastroenterology;  Laterality: N/A;   EYE SURGERY     lasik 15 yrs. ago, cataracts removed - both eyes    HAMMER TOE SURGERY     right toe   INSERTION OF ILIAC STENT Right 06/15/2019   Procedure: INSERTION OF ILIAC STENT ( STENTING OF SFA/POP ARTERY );  Surgeon: Katha Cabal, MD;  Location: [facility];  Service: Vascular;  Laterality: Right;  angioplpasty and stent placement: right superficial femoral right tibiopopliteal trunk bilateral common iliac arteries   IR IMAGING GUIDED PORT INSERTION  11/09/2020   LEFT HEART CATH AND CORONARY ANGIOGRAPHY Left 06/10/2017   Procedure: LEFT HEART CATH AND CORONARY ANGIOGRAPHY;  Surgeon: Minna Merritts, MD;  Location: [facility] [place] [facility];  Service: Cardiovascular;  Laterality: Left;   LOWER EXTREMITY ANGIOGRAPHY Left 01/04/2019   Procedure: LOWER EXTREMITY ANGIOGRAPHY;  Surgeon: Katha Cabal, MD;  Location: [facility];  Service: Cardiovascular;  Laterality: Left;    LOWER EXTREMITY ANGIOGRAPHY Right 01/25/2019   Procedure: LOWER EXTREMITY ANGIOGRAPHY;  Surgeon: Katha Cabal, MD;  Location: [facility];  Service: Cardiovascular;  Laterality: Right;   LOWER EXTREMITY ANGIOGRAPHY Right 01/15/2021   LOWER EXTREMITY ANGIOGRAPHY and stent placement to R SFA and popliteal artery Delana Meyer, Dolores Lory, MD)   NASAL SINUS SURGERY  2008   septpolasty, bilateral turbinate reduction   SHOULDER ARTHROSCOPY  2012   TEE WITHOUT CARDIOVERSION N/A 01/24/2015   Procedure: TRANSESOPHAGEAL ECHOCARDIOGRAM (TEE);  Surgeon: [name], MD;  Location: [facility];  Service: Open Heart Surgery;  Laterality: N/A;   TOE SURGERY  1994   UPPER GI ENDOSCOPY  07/2014, 04-24-16   Dr Raquel James   WRIST SURGERY  2011    HEMATOLOGY/ONCOLOGY HISTORY:  Oncology History Overview Note  #MAY 2022-Lung cancer-non-small cell [CT guided bx] T3N1 vs stage IV Dr.Hendrickson.  MRI brain negative for malignancy.# 1. April 2022- LLL ~4.0 cm mass in the superior segment left lower lobe abuts the major fissure and the posterior pleural surface without visible chest wall invasion without pleural effusion. There 2-3 other small nodules in the  left lower lobe, largest measures 9 mm in diameter. These are suspicious for same lobe satellite lesions and there is left hilar adenopathy. Assuming non-small cell lung cancer the appearance is compatible with T3 N1 M0 disease (stage IIIA).  # right suprarenal nodule-awaiting biopsy on 5/31- non-small cell [revived at tumor conference at [facility]  # June 9th 2022- CARBO-TAXOl-KEYTRUDA; Fulphila  MOLECULAR TESTING: NGS TPS-PDL 100%; EXON 12 amplification* mutations.    # CAD [CABG 2016; Dr.Gollan]   Cancer of lower lobe of left lung ([place])  11/02/2020 Initial Diagnosis   Cancer of lower lobe of left lung ([place])   11/02/2020 Cancer Staging   Staging form: Lung, AJCC 8th Edition - Clinical: Stage IVA (cT3, cN1, cM1a) - Signed by Cammie Sickle,  MD on 11/02/2020   11/16/2020 -  Chemotherapy    Patient is on Treatment Plan: LUNG NSCLC CARBOPLATIN + PACLITAXEL + PEMBROLIZUMAB Q21D X 4 CYCLES / PEMBROLIZUMAB MAINTENANCE Q21D         ALLERGIES:  is allergic to flomax [tamsulosin].  MEDICATIONS:  Current Outpatient Medications  Medication Sig Dispense Refill   acetaminophen (TYLENOL) 325 MG tablet Take 650 mg by mouth as needed for moderate pain or mild pain.     Ascorbic Acid (VITAMIN C) 1000 MG tablet Take 1,000 mg by mouth daily.     aspirin 81 MG EC tablet Take 81 mg by mouth daily.       Calcium-Magnesium-Vitamin D (CALCIUM 1200+D3 PO) Take 1 tablet by mouth daily.     Carboxymethylcellul-Glycerin (LUBRICATING EYE DROPS OP) Place 1 drop into both eyes daily as needed (irritation).     Cholecalciferol (VITAMIN D3) 50 MCG (2000 UT) TABS Take 2,000 Units by mouth daily.     clopidogrel (PLAVIX) 75 MG tablet Take 1 tablet (75 mg total) by mouth daily. 30 tablet 11   Cyanocobalamin (B-12) 5000 MCG CAPS Take 5,000 mcg by mouth daily.     docusate sodium (COLACE) 100 MG capsule Take 200 mg by mouth at bedtime.     lidocaine-prilocaine (EMLA) cream Apply 30 -45 mins prior to port access. 30 g 0   lisinopril (ZESTRIL) 5 MG tablet TAKE 1 TABLET BY MOUTH DAILY 90 tablet 3   loratadine (CLARITIN) 10 MG tablet Take 10 mg by mouth daily.     Melatonin 10 MG CAPS Take 10 mg by mouth at bedtime as needed (sleep).     metoprolol succinate (TOPROL-XL) 25 MG 24 hr tablet TAKE 1/2 TABLET (12.5 MG) BY MOUTH DAILY 45 tablet 3   Niacinamide-Zn-Cu-Methfo-Se-Cr (NICOTINAMIDE PO) Take 500 mg by mouth daily.     nitroGLYCERIN (NITROSTAT) 0.4 MG SL tablet Place 1 tablet (0.4 mg total) under the tongue every 5 (five) minutes as needed for up to 25 doses for chest pain. 25 tablet 1   ondansetron (ZOFRAN) 8 MG tablet One pill every 8 hours as needed for nausea/vomitting. 40 tablet 1   pantoprazole (PROTONIX) 40 MG tablet TAKE 1 TABLET BY MOUTH DAILY (Patient  taking differently: Take 40 mg by mouth daily.) 90 tablet 3   Polyethyl Glycol-Propyl Glycol (LUBRICANT EYE DROPS) 0.4-0.3 % SOLN Place 1-2 drops into both eyes 3 (three) times daily as needed (burning eyes.).     prochlorperazine (COMPAZINE) 10 MG tablet Take 1 tablet (10 mg total) by mouth every 6 (six) hours as needed for nausea or vomiting. 40 tablet 1   simvastatin (ZOCOR) 40 MG tablet TAKE 1 TABLET BY MOUTH AT BEDTIME 90 tablet 0  traMADol (ULTRAM) 50 MG tablet TAKE 1 TABLET BY MOUTH 3 TIMES DAILY AS NEEDED FOR MODERATE PAIN 90 tablet 0   Vitamin E 450 MG (1000 UT) CAPS Take 450 Units by mouth daily.     Zinc 50 MG TABS Take 50 mg by mouth daily.     No current facility-administered medications for this visit.    VITAL SIGNS: BP 120/61   Pulse 78   Temp 97.6 F (36.4 C) (Tympanic)   Resp 18   SpO2 94%  There were no vitals filed for this visit.  Estimated body mass index is 32.69 kg/m as calculated from the following:   Height as of 01/31/21: 5\' 8"  (1.727 m).   Weight as of an earlier encounter on 02/13/21: 215 lb (97.5 kg).  LABS: CBC:    Component Value Date/Time   WBC 10.4 02/13/2021 0853   HGB 13.0 02/13/2021 0853   HGB 14.8 01/15/2015 0956   HCT 38.6 (L) 02/13/2021 0853   HCT 44.2 01/15/2015 0956   PLT 257 02/13/2021 0853   PLT 207 01/15/2015 0956   MCV 94.1 02/13/2021 0853   MCV 91 01/15/2015 0956   NEUTROABS 7.8 (H) 02/13/2021 0853   NEUTROABS 5.2 01/15/2015 0956   LYMPHSABS 1.4 02/13/2021 0853   LYMPHSABS 1.9 01/15/2015 0956   MONOABS 1.1 (H) 02/13/2021 0853   EOSABS 0.0 02/13/2021 0853   EOSABS 0.2 01/15/2015 0956   BASOSABS 0.1 02/13/2021 0853   BASOSABS 0.0 01/15/2015 0956   Comprehensive Metabolic Panel:    Component Value Date/Time   NA 131 (L) 02/13/2021 0853   NA 138 01/15/2015 0956   K 4.5 02/13/2021 0853   CL 101 02/13/2021 0853   CO2 23 02/13/2021 0853   BUN 24 (H) 02/13/2021 0853   BUN 16 01/15/2015 0956   CREATININE 0.93 02/13/2021 0853    GLUCOSE 107 (H) 02/13/2021 0853   CALCIUM 8.8 (L) 02/13/2021 0853   AST 15 02/13/2021 0853   ALT 12 02/13/2021 0853   ALKPHOS 66 02/13/2021 0853   BILITOT 0.4 02/13/2021 0853   PROT 7.0 02/13/2021 0853   ALBUMIN 4.1 02/13/2021 0853    RADIOGRAPHIC STUDIES: CT CHEST ABDOMEN PELVIS W CONTRAST  Result Date: 01/15/2021 CLINICAL DATA:  Metastatic left lung cancer restaging, biopsy proven metastatic nodule adjacent to the superior pole of the right kidney, assess treatment response EXAM: CT CHEST, ABDOMEN, AND PELVIS WITH CONTRAST TECHNIQUE: Multidetector CT imaging of the chest, abdomen and pelvis was performed following the standard protocol during bolus administration of intravenous contrast. CONTRAST:  167mL OMNIPAQUE IOHEXOL 350 MG/ML SOLN, additional oral enteric contrast COMPARISON:  MR abdomen, 11/19/2020, PET-CT, 10/17/2020, CT chest, 10/08/2020 FINDINGS: CT CHEST FINDINGS Cardiovascular: Right chest port catheter. Aortic atherosclerosis. Normal heart size. Three-vessel coronary artery calcifications. No pericardial effusion. Mediastinum/Nodes: Previously seen enlarged posterior left hilar lymph node is reduced in size, measuring 1.2 x 0.9 cm, previously 1.7 x 1.2 cm (series 2, image 32). No residual enlarged mediastinal, hilar, or axillary lymph nodes. Thyroid gland, trachea, and esophagus demonstrate no significant findings. Lungs/Pleura: Mild centrilobular and paraseptal emphysema. Diffuse bilateral bronchial wall thickening.Subpleural mass of the superior segment left lower lobe is decreased in size, measuring 3.0 x 2.2 cm, previously 4.0 x 3.0 cm (series 3, image 76). An adjacent nodule abutting the fissure is almost completely resolved, measuring no greater than 2 mm (series 3, image 87). There is a new irregular nodule or consolidation of the dependent right upper lobe measuring 1.6 x 1.3 cm (series  3, image 73). Stable, partially calcified benign incidental nodule of the medial right upper  lobe measuring 6 mm (series 3, image 58). Unchanged minimal dependent bibasilar scarring and or atelectasis. No pleural effusion or pneumothorax. Musculoskeletal: No chest wall mass or suspicious bone lesions identified. CT ABDOMEN PELVIS FINDINGS Hepatobiliary: No solid liver abnormality is seen. Multiple low-attenuation cysts of the liver, previously better characterized by MRI. No gallstones, gallbladder wall thickening, or biliary dilatation. Pancreas: Unremarkable. No pancreatic ductal dilatation or surrounding inflammatory changes. Spleen: Normal in size without significant abnormality. Adrenals/Urinary Tract: Adrenal glands are unremarkable. Interval decrease in size of a metastatic soft tissue nodule adjacent to the superior pole of the right kidney, in the hepatorenal fossa, measuring 2.2 x 2.0 cm, previously 3.7 x 3.2 cm (series 2, image 63). Small nonobstructive left renal calculi. No right-sided calculi, ureteral calculi, or hydronephrosis. The bladder is normal. Stomach/Bowel: Stomach is within normal limits. Appendix appears normal. No evidence of bowel wall thickening, distention, or inflammatory changes. Large burden of stool throughout the colon and rectum. Vascular/Lymphatic: Aortic atherosclerosis. No enlarged abdominal or pelvic lymph nodes. Reproductive: No mass or other abnormality. Other: Fat containing left inguinal hernia. No abdominopelvic ascites. Musculoskeletal: No acute or significant osseous findings. IMPRESSION: 1. Subpleural mass of the superior segment left lower lobe is decreased in size. 2. An adjacent nodule of the left lower lobe abutting the fissure is almost completely resolved. 3. Interval decrease in size of enlarged left hilar lymph node. 4. Interval decrease in size of a metastatic soft tissue nodule adjacent to the superior pole of the right kidney. 5. Constellation of findings is consistent with treatment response. 6. There is a new irregular nodule or consolidation of  the dependent right upper lobe measuring 1.6 x 1.3 cm. This is likely infectious or inflammatory given morphology, although metastatic disease is not strictly excluded. Attention on follow-up. 7. Coronary artery disease. Aortic Atherosclerosis (ICD10-I70.0) and Emphysema (ICD10-J43.9). Electronically Signed   By: Eddie Candle M.D.   On: 01/15/2021 09:11   PERIPHERAL VASCULAR CATHETERIZATION  Result Date: 01/15/2021 See surgical note for result.  VAS [place] ABI WITH/WO TBI  Result Date: 01/31/2021  LOWER EXTREMITY DOPPLER STUDY Patient Name:  REINER LOEWEN.  Date of Exam:   01/31/2021 Medical Rec #: [id number]            Accession #:    [id number] Date of Birth: [birth year]            Patient Gender: M Patient Age:   33 years Exam Location:  [facility] Procedure:      VAS [place] ABI WITH/WO TBI Referring Phys: [facility] --------------------------------------------------------------------------------  Indications: Peripheral artery disease, and worsening right calf pain. Other Factors: S/P Interventions.  Vascular Interventions: 01/15/2021 PTA and stent of Rt SFA and popliteal artery. Comparison Study: 12/26/2020 Performing Technologist: Charlane Ferretti RT (R)(VS)  Examination Guidelines: A complete evaluation includes at minimum, Doppler waveform signals and systolic blood pressure reading at the level of bilateral brachial, anterior tibial, and posterior tibial arteries, when vessel segments are accessible. Bilateral testing is considered an integral part of a complete examination. Photoelectric Plethysmograph (PPG) waveforms and toe systolic pressure readings are included as required and additional duplex testing as needed. Limited examinations for reoccurring indications may be performed as noted.  ABI Findings: +---------+------------------+-----+---------+--------+ Right    Rt Pressure (mmHg)IndexWaveform Comment  +---------+------------------+-----+---------+--------+ Brachial 124                                       +---------+------------------+-----+---------+--------+  ATA      119                    biphasic          +---------+------------------+-----+---------+--------+ PTA      137               1.10 triphasic         +---------+------------------+-----+---------+--------+ Great Toe108               0.87 Normal            +---------+------------------+-----+---------+--------+ +---------+------------------+-----+---------+-------+ Left     Lt Pressure (mmHg)IndexWaveform Comment +---------+------------------+-----+---------+-------+ Brachial 124                                     +---------+------------------+-----+---------+-------+ ATA      116                    biphasic         +---------+------------------+-----+---------+-------+ PTA      11                0.09 triphasic        +---------+------------------+-----+---------+-------+ Great Toe94                0.76 Normal           +---------+------------------+-----+---------+-------+ +-------+-----------+-----------+------------+------------+ ABI/TBIToday's ABIToday's TBIPrevious ABIPrevious TBI +-------+-----------+-----------+------------+------------+ Right  1.10       .87        .51         .40          +-------+-----------+-----------+------------+------------+ Left   .98        .76        .96         .86          +-------+-----------+-----------+------------+------------+ Right ABIs appear increased compared to prior study on 12/26/2020. Left ABIs appear essentially unchanged compared to prior study on 12/26/2020.  Summary: Right: Resting right ankle-brachial index is within normal range. No evidence of significant right lower extremity arterial disease. The right toe-brachial index is normal. Right TBI appears increased as compared to the previous exam on 12/26/2020. Left: Resting left ankle-brachial index is within normal range. No evidence of significant left  lower extremity arterial disease. The left toe-brachial index is normal. Left TBI appears essentially unchanged as compared to the previous exam on 12/26/2020.  *See table(s) above for measurements and observations.  Electronically signed by Hortencia Pilar MD on 01/31/2021 at 4:23:18 PM.    Final     PERFORMANCE STATUS (ECOG) : 1 - Symptomatic but completely ambulatory  Review of Systems Unless otherwise noted, a complete review of systems is negative.  Physical Exam General: NAD Cardiovascular: regular rate and rhythm Pulmonary: clear ant fields Abdomen: soft, nontender, + bowel sounds GU: no suprapubic tenderness Extremities: no edema, no joint deformities, palpable fixed mass in the left forearm.  No erythema, drainage, or puncture wound.   skin: no rashes Neurological: Weakness but otherwise nonfocal  Assessment and Plan- Patient is a 80 y.o. male with multiple medical problems including stage IV non-small cell lung cancer on chemo with carbo/Taxol plus pembrolizumab  Fatigue -likely secondary to chemotherapy.  Patient is noted to be slightly hyponatremic today.  We will give IV fluids.  Will check TSH, B12, and vitamin D.  Will refer for rehab screening.  Discussed possible trial of  American ginseng.  Forearm mass -unclear etiology.  Could be isolated adenopathy.  However, patient reports symptoms seem to be improving and this could possibly be self-limiting.  Discussed with Dr. Rosanna Randy and will order ultrasound.  Case and plan discussed with Dr. Rogue Bussing    Patient expressed understanding and was in agreement with this plan. He also understands that He can call clinic at any time with any questions, concerns, or complaints.   Thank you for allowing me to participate in the care of this very pleasant patient.   Time Total: 15 minutes  Visit consisted of counseling and education dealing with the complex and emotionally intense issues of symptom management and palliative care in  the setting of serious and potentially life-threatening illness.Greater than 50%  of this time was spent counseling and coordinating care related to the above assessment and plan.  Signed by: Altha Harm, PhD, NP-C

## 2021-02-13 NOTE — Addendum Note (Signed)
Addended by: Altha Harm R on: 02/13/2021 01:51 PM   Modules accepted: Orders

## 2021-02-14 ENCOUNTER — Ambulatory Visit
Admission: RE | Admit: 2021-02-14 | Discharge: 2021-02-14 | Disposition: A | Discharge status: Home / Self Care | Payer: PPO | Source: Ambulatory Visit | Attending: Internal Medicine | Admitting: Internal Medicine

## 2021-02-14 ENCOUNTER — Inpatient Hospital Stay: Payer: PPO

## 2021-02-14 ENCOUNTER — Inpatient Hospital Stay: Payer: PPO | Admitting: Internal Medicine

## 2021-02-14 VITALS — BP 117/67 | HR 73 | Temp 97.8°F | Resp 16 | Wt 214.4 lb

## 2021-02-14 DIAGNOSIS — C3432 Malignant neoplasm of lower lobe, left bronchus or lung: Secondary | ICD-10-CM

## 2021-02-14 DIAGNOSIS — W19XXXA Unspecified fall, initial encounter: Secondary | ICD-10-CM

## 2021-02-14 DIAGNOSIS — R531 Weakness: Secondary | ICD-10-CM | POA: Diagnosis not present

## 2021-02-14 DIAGNOSIS — X58XXXA Exposure to other specified factors, initial encounter: Secondary | ICD-10-CM | POA: Diagnosis not present

## 2021-02-14 DIAGNOSIS — R5383 Other fatigue: Secondary | ICD-10-CM | POA: Diagnosis not present

## 2021-02-14 LAB — COMPREHENSIVE METABOLIC PANEL
ALT: 12 U/L (ref 0–44)
AST: 15 U/L (ref 15–41)
Albumin: 4.2 g/dL (ref 3.5–5.0)
Alkaline Phosphatase: 65 U/L (ref 38–126)
Anion gap: 6 (ref 5–15)
BUN: 21 mg/dL (ref 8–23)
CO2: 25 mmol/L (ref 22–32)
Calcium: 9.3 mg/dL (ref 8.9–10.3)
Chloride: 105 mmol/L (ref 98–111)
Creatinine, Ser: 0.82 mg/dL (ref 0.61–1.24)
GFR, Estimated: >60 mL/min (ref >60)
Glucose, Bld: 111 mg/dL — ABNORMAL HIGH (ref 70–99)
Potassium: 4.5 mmol/L (ref 3.5–5.1)
Sodium: 136 mmol/L (ref 135–145)
Total Bilirubin: 0.6 mg/dL (ref 0.3–1.2)
Total Protein: 7.1 g/dL (ref 6.5–8.1)

## 2021-02-14 LAB — CBC WITH DIFFERENTIAL/PLATELET
Abs Immature Granulocytes: 0.07 10*3/uL (ref 0.00–0.07)
Basophils Absolute: 0 10*3/uL (ref 0.0–0.1)
Basophils Relative: 0 %
Eosinophils Absolute: 0 10*3/uL (ref 0.0–0.5)
Eosinophils Relative: 0 %
HCT: 39.1 % (ref 39.0–52.0)
Hemoglobin: 13.3 g/dL (ref 13.0–17.0)
Immature Granulocytes: 1 %
Lymphocytes Relative: 14 %
Lymphs Abs: 1.3 10*3/uL (ref 0.7–4.0)
MCH: 31.7 pg (ref 26.0–34.0)
MCHC: 34 g/dL (ref 30.0–36.0)
MCV: 93.3 fL (ref 80.0–100.0)
Monocytes Absolute: 1.1 10*3/uL — ABNORMAL HIGH (ref 0.1–1.0)
Monocytes Relative: 11 %
Neutro Abs: 6.9 10*3/uL (ref 1.7–7.7)
Neutrophils Relative %: 74 %
Platelets: 286 10*3/uL (ref 150–400)
RBC: 4.19 MIL/uL — ABNORMAL LOW (ref 4.22–5.81)
RDW: 14.6 % (ref 11.5–15.5)
WBC: 9.5 10*3/uL (ref 4.0–10.5)
nRBC: 0 % (ref 0.0–0.2)

## 2021-02-14 MED ORDER — DEXAMETHASONE SODIUM PHOSPHATE 10 MG/ML IJ SOLN
10.0000 mg | Freq: Once | INTRAMUSCULAR | Status: AC
  Administered 2021-02-14: 10 mg via INTRAVENOUS
  Filled 2021-02-14: qty 1

## 2021-02-14 MED ORDER — PREDNISONE 20 MG PO TABS: 20.0000 mg | ORAL_TABLET | Freq: Every day | ORAL | 0 refills | Status: DC

## 2021-02-14 MED ORDER — GADOBUTROL 1 MMOL/ML IV SOLN
10.0000 mL | Freq: Once | INTRAVENOUS | Status: AC | PRN
  Administered 2021-02-14: 10 mL via INTRAVENOUS

## 2021-02-14 MED ORDER — HEPARIN SOD (PORK) LOCK FLUSH 100 UNIT/ML IV SOLN
INTRAVENOUS | Status: AC
  Filled 2021-02-14: qty 5

## 2021-02-14 MED ORDER — SODIUM CHLORIDE 0.9 % IV SOLN
Freq: Once | INTRAVENOUS | Status: AC
  Filled 2021-02-14: qty 250

## 2021-02-14 NOTE — Assessment & Plan Note (Addendum)
#  Lung cancer-non-small cell stage IV [right suprarenal nodule-s/p biopsy "non-small cell ca"].  Currently on CarboTaxol plus Keytruda. CT AUG 5th, 2022-partial response [ subpleural mass of the superior segment left lower lobe is decreased in size. An adjacent nodule of the left lower lobe abutting the fissure is almost completely resolved;  Interval decrease in size of enlarged left hilar lymph node; Interval decrease in size of a metastatic soft tissue nodule adjacent to the superior pole of the right kidney]  # currently s/p  cycle #4-CarboTaxol Keytruda today-hold chemotherapy today given the significant fatigue.  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  Check brain MRI STAT.  Prednisone 20 mg a day  #Right upper lobe new groundglass opacity [AUG 8th CT chest]-suspect inflammatory.  Monitor for now.   # CAD-s/p CABG- STABLE; no decompensation   # PVD-s/p stent [Dr.Schneir]-improvement in pain noted.  On Plavix-STABLE.   # Skin rash- back/torso- G-1-2-itching- not improved on hydrocortisone; add kenalog. Benadryl 25 mg PO qhs.   # LEFT UE- Soft tissue mass- ? metastses vs trauma- await US soft tissue [ordered by Josh]  # DISPOSITION: # STAT MRI Brain # HOLD keytruda today. # IVFs; Dex 10 mg x1 today #  Follow up in 1 week- MD; labs- cbc/mp; No chemo-Dr.B  Addendum: Brain MRI negative for any acute intracranial pathology.  Reviewed the results of the MRI.  Patient slightly improved after dexamethasone in the clinic today.  Continue prednisone 20 mg once a day.  I suspect patient will need a break from chemotherapy.  Follow-up as planned.

## 2021-02-14 NOTE — Progress Notes (Signed)
[place] NOTE  Patient Care Team: Ria Bush, MD as PCP - General (Family Medicine) [name] [name], MD as PCP - Cardiology (Cardiology) [name], MD (Internal Medicine) Minna Merritts, MD as Consulting Physician (Cardiology) Pieter Partridge, DO as Consulting Physician (Neurology) Cammie Sickle, MD as Consulting Physician (Hematology and Oncology) Telford Nab, RN as Oncology Nurse Navigator  CHIEF COMPLAINTS/PURPOSE OF CONSULTATION: lung cancer    Oncology History Overview Note  #MAY 2022-Lung cancer-non-small cell [CT guided bx] T3N1 vs stage IV Dr.Hendrickson.  MRI brain negative for malignancy.# 1. April 2022- LLL ~4.0 cm mass in the superior segment left lower lobe abuts the major fissure and the posterior pleural surface without visible chest wall invasion without pleural effusion. There 2-3 other small nodules in the left lower lobe, largest measures 9 mm in diameter. These are suspicious for same lobe satellite lesions and there is left hilar adenopathy. Assuming non-small cell lung cancer the appearance is compatible with T3 N1 M0 disease (stage IIIA).  # right suprarenal nodule-awaiting biopsy on 5/31- non-small cell [revived at tumor conference at [facility]  # June 9th 2022- CARBO-TAXOl-KEYTRUDA; Fulphila  MOLECULAR TESTING: NGS TPS-PDL 100%; EXON 12 amplification* mutations.    # CAD [CABG 2016; Dr.Gollan]   Cancer of lower lobe of left lung ([place])  11/02/2020 Initial Diagnosis   Cancer of lower lobe of left lung ([place])   11/02/2020 Cancer Staging   Staging form: Lung, AJCC 8th Edition - Clinical: Stage IVA (cT3, cN1, cM1a) - Signed by Cammie Sickle, MD on 11/02/2020   11/16/2020 -  Chemotherapy    Patient is on Treatment Plan: LUNG NSCLC CARBOPLATIN + PACLITAXEL + PEMBROLIZUMAB Q21D X 4 CYCLES / PEMBROLIZUMAB MAINTENANCE Q21D          HISTORY OF PRESENTING ILLNESS:  Randall Ali. 80 y.o.  male lung  cancer-non-small cell stage IV [supra-renal nodule s/p biopsy] currently on CarboTaxol Keytruda status post cycle #3 is here for follow-up.   Patient was seen in the symptom management clinic yesterday because of extreme fatigue.  Received IV fluids.  He continues to feel extremely fatigued.  Denies any headaches.  No vision changes.  He is fatigued to point for concerning for falls.  Review of Systems  Constitutional:  Positive for malaise/fatigue. Negative for chills, diaphoresis, fever and weight loss.  HENT:  Negative for nosebleeds and sore throat.   Eyes:  Negative for double vision.  Respiratory:  Negative for cough, hemoptysis, sputum production, shortness of breath and wheezing.   Cardiovascular:  Negative for chest pain, palpitations, orthopnea and leg swelling.  Gastrointestinal:  Negative for abdominal pain, blood in stool, constipation, diarrhea, heartburn, melena, nausea and vomiting.  Genitourinary:  Negative for dysuria, frequency and urgency.  Musculoskeletal:  Positive for back pain and joint pain.  Skin: Negative.  Negative for itching and rash.  Neurological:  Negative for dizziness, tingling, focal weakness, weakness and headaches.  Endo/Heme/Allergies:  Does not bruise/bleed easily.  Psychiatric/Behavioral:  Negative for depression. The patient is not nervous/anxious and does not have insomnia.     MEDICAL HISTORY:  Past Medical History:  Diagnosis Date  . Allergy    seasonal  . Arthritis    all over- in general   . CAD (coronary artery disease)    a. inferior wall MI 10/01 s/p PCI/DES to RCA; b. Myoview 3/16 neg for ischemia; c. LHC 8/16: ostLAD 80%, OM1 70%, OM2 70% x 2 lesions, mRCA 30%, dRCA  70% s/p 4-V CABG 01/24/15 (LIMA-LAD, VG- OM1, VG-OM2, VG-PDA)   . Cancer (HCC)    skin, melanoma  . Carotid artery disease ([place])    a. [place] 8/16: 1-39% bilateral ICA stenosis  . Cataract    removed  . Diastolic dysfunction    a. TTE 8/16: EF 55-60%, no RWMA, Gr1DD,  calcified mitral annulus, mild biatrial enlargement  . Erectile dysfunction   . GERD (gastroesophageal reflux disease)   . History of elbow surgery   . History of hiatal hernia   . HLD (hyperlipidemia)   . HTN (hypertension)   . Inferior myocardial infarction ([place]) 03/2000   stent RCA  . Lung cancer ([place])   . Postoperative wound infection 02/02/2015  . Reflux esophagitis   . Sleep apnea 2017   CPAP at night    SURGICAL HISTORY: Past Surgical History:  Procedure Laterality Date  . arm surgery  2010  . BROW LIFT Bilateral 11/25/2019   Procedure: BROW PTOSIS REPAIR BILATERAL;  Surgeon: Karle Starch, MD;  Location: [facility];  Service: Ophthalmology;  Laterality: Bilateral;  sleep apnea  . CARDIAC CATHETERIZATION  06/24/2011  . CARDIAC CATHETERIZATION N/A 01/18/2015   Procedure: Left Heart Cath with coronary angiography;  Surgeon: Minna Merritts, MD;  Location: [facility];  Service: Cardiovascular;  Laterality: N/A;  . CARDIAC CATHETERIZATION N/A 01/18/2015   Procedure: Intravascular Pressure Wire/FFR Study;  Surgeon: [name], MD;  Location: [facility];  Service: Cardiovascular;  Laterality: N/A;  . CAROTID STENT  03/10/2011  . COLONOSCOPY  2010  . COLONOSCOPY  06/14/2014   Dr Hilarie Fredrickson  . CORONARY ARTERY BYPASS GRAFT N/A 01/24/2015   Procedure: CORONARY ARTERY BYPASS GRAFTING x 4 (LIMA-LAD, SVG-Int 1- Int 2, SVG-PD) ENDOSCOPIC GREATER SAPHENOUS VEIN HARVEST LEFT LEG;  Surgeon: [name], MD;  Location: [facility];  Service: Open Heart Surgery;  Laterality: N/A;  . EMBOLECTOMY  06/15/2019   Procedure: EMBOLECTOMY;  Surgeon: Katha Cabal, MD;  Location: [facility];  Service: Vascular;;  right superficial femoral artery  . ENDARTERECTOMY FEMORAL Right 06/15/2019   Procedure: ENDARTERECTOMY FEMORAL;  Surgeon: Katha Cabal, MD;  Location: [facility];  Service: Vascular;  Laterality: Right;  common femoral profunda femoris superficial  femoral  . ESOPHAGOGASTRODUODENOSCOPY (EGD) WITH PROPOFOL N/A 04/24/2016   Procedure: ESOPHAGOGASTRODUODENOSCOPY (EGD) WITH PROPOFOL;  Surgeon: Jerene Bears, MD;  Location: [facility];  Service: Gastroenterology;  Laterality: N/A;  . EYE SURGERY     lasik 15 yrs. ago, cataracts removed - both eyes   . HAMMER TOE SURGERY     right toe  . INSERTION OF ILIAC STENT Right 06/15/2019   Procedure: INSERTION OF ILIAC STENT ( STENTING OF SFA/POP ARTERY );  Surgeon: Katha Cabal, MD;  Location: [facility];  Service: Vascular;  Laterality: Right;  angioplpasty and stent placement: right superficial femoral right tibiopopliteal trunk bilateral common iliac arteries  . IR IMAGING GUIDED PORT INSERTION  11/09/2020  . LEFT HEART CATH AND CORONARY ANGIOGRAPHY Left 06/10/2017   Procedure: LEFT HEART CATH AND CORONARY ANGIOGRAPHY;  Surgeon: Minna Merritts, MD;  Location: [facility];  Service: Cardiovascular;  Laterality: Left;  . LOWER EXTREMITY ANGIOGRAPHY Left 01/04/2019   Procedure: LOWER EXTREMITY ANGIOGRAPHY;  Surgeon: Katha Cabal, MD;  Location: [facility];  Service: Cardiovascular;  Laterality: Left;  . LOWER EXTREMITY ANGIOGRAPHY Right 01/25/2019   Procedure: LOWER EXTREMITY ANGIOGRAPHY;  Surgeon: Katha Cabal, MD;  Location:  [facility];  Service: Cardiovascular;  Laterality: Right;  . LOWER EXTREMITY ANGIOGRAPHY Right 01/15/2021   LOWER EXTREMITY ANGIOGRAPHY and stent placement to R SFA and popliteal artery (Schnier, Dolores Lory, MD)  . NASAL SINUS SURGERY  2008   septpolasty, bilateral turbinate reduction  . SHOULDER ARTHROSCOPY  2012  . TEE WITHOUT CARDIOVERSION N/A 01/24/2015   Procedure: TRANSESOPHAGEAL ECHOCARDIOGRAM (TEE);  Surgeon: [name], MD;  Location: [facility];  Service: Open Heart Surgery;  Laterality: N/A;  . [place]  . UPPER GI ENDOSCOPY  07/2014, 04-24-16   Dr Raquel James  . WRIST SURGERY  2011    SOCIAL  HISTORY: Social History   Socioeconomic History  . Marital status: Single    Spouse name: Not on file  . Number of children: Not on file  . Years of education: 31  . Highest education level: Not on file  Occupational History  . Occupation: retired    Comment: Agricultural consultant  Tobacco Use  . Smoking status: Former    Packs/day: 2.50    Years: 40.00    Pack years: 100.00    Types: Cigarettes    Quit date: 03/24/2000    Years since quitting: 20.9  . Smokeless tobacco: Never  Vaping Use  . Vaping Use: Never used  Substance and Sexual Activity  . Alcohol use: Yes    Comment: occassionally  . Drug use: No  . Sexual activity: Yes  Other Topics Concern  . Not on file  Social History Narrative   Singled; lives with son and dog    Occ: retired, back part time at [employer];    Activity: gym 4-5d/wk   Diet: good water, fruits/vegetables daily   Caffeine Use-yes      ------------------------------------       Car sales- retd; [employer]- retd; quit smoking 2001. Alcohol couple nights a week. Live in [place]. Daughter lives 10 mins.    Social Determinants of Health   Financial Resource Strain: Low Risk   . Difficulty of Paying Living Expenses: Not hard at all  Food Insecurity: No Food Insecurity  . Worried About Charity fundraiser in the Last Year: Never true  . Ran Out of Food in the Last Year: Never true  Transportation Needs: No Transportation Needs  . Lack of Transportation (Medical): No  . Lack of Transportation (Non-Medical): No  Physical Activity: Sufficiently Active  . Days of Exercise per Week: 7 days  . Minutes of Exercise per Session: 60 min  Stress: No Stress Concern Present  . Feeling of Stress : Not at all  Social Connections: Not on file  Intimate Partner Violence: Not At Risk  . Fear of Current or Ex-Partner: No  . Emotionally Abused: No  . Physically Abused: No  . Sexually Abused: No    FAMILY HISTORY: Family History  Problem Relation Age of Onset  .  Hypertension Mother   . Heart disease Mother   . Hypertension Father   . Diabetes Father   . Lymphoma Sister   . Heart disease Brother 6  . Cancer Paternal Grandfather   . Colon cancer Neg Hx   . Prostate cancer Neg Hx   . Bladder Cancer Neg Hx   . Kidney cancer Neg Hx     ALLERGIES:  is allergic to flomax [tamsulosin].  MEDICATIONS:  Current Outpatient Medications  Medication Sig Dispense Refill  . acetaminophen (TYLENOL) 325 MG tablet Take 650 mg by mouth as needed for  moderate pain or mild pain.    . Ascorbic Acid (VITAMIN C) 1000 MG tablet Take 1,000 mg by mouth daily.    [name] aspirin 81 MG EC tablet Take 81 mg by mouth daily.      . Calcium-Magnesium-Vitamin D (CALCIUM 1200+D3 PO) Take 1 tablet by mouth daily.    . Carboxymethylcellul-Glycerin (LUBRICATING EYE DROPS OP) Place 1 drop into both eyes daily as needed (irritation).    . Cholecalciferol (VITAMIN D3) 50 MCG (2000 UT) TABS Take 2,000 Units by mouth daily.    . clopidogrel (PLAVIX) 75 MG tablet Take 1 tablet (75 mg total) by mouth daily. 30 tablet 11  . Cyanocobalamin (B-12) 5000 MCG CAPS Take 5,000 mcg by mouth daily.    [name] docusate sodium (COLACE) 100 MG capsule Take 200 mg by mouth at bedtime.    . lidocaine-prilocaine (EMLA) cream Apply 30 -45 mins prior to port access. 30 g 0  . lisinopril (ZESTRIL) 5 MG tablet TAKE 1 TABLET BY MOUTH DAILY 90 tablet 3  . loratadine (CLARITIN) 10 MG tablet Take 10 mg by mouth daily.    . Melatonin 10 MG CAPS Take 10 mg by mouth at bedtime as needed (sleep).    . metoprolol succinate (TOPROL-XL) 25 MG 24 hr tablet TAKE 1/2 TABLET (12.5 MG) BY MOUTH DAILY 45 tablet 3  . Niacinamide-Zn-Cu-Methfo-Se-Cr (NICOTINAMIDE PO) Take 500 mg by mouth daily.    . nitroGLYCERIN (NITROSTAT) 0.4 MG SL tablet Place 1 tablet (0.4 mg total) under the tongue every 5 (five) minutes as needed for up to 25 doses for chest pain. 25 tablet 1  . ondansetron (ZOFRAN) 8 MG tablet One pill every 8 hours as needed  for nausea/vomitting. 40 tablet 1  . pantoprazole (PROTONIX) 40 MG tablet TAKE 1 TABLET BY MOUTH DAILY (Patient taking differently: Take 40 mg by mouth daily.) 90 tablet 3  . Polyethyl Glycol-Propyl Glycol (LUBRICANT EYE DROPS) 0.4-0.3 % SOLN Place 1-2 drops into both eyes 3 (three) times daily as needed (burning eyes.).    [name] predniSONE (DELTASONE) 20 MG tablet Take 1 tablet (20 mg total) by mouth daily with breakfast. Once a day with food x 10 days. 10 tablet 0  . prochlorperazine (COMPAZINE) 10 MG tablet Take 1 tablet (10 mg total) by mouth every 6 (six) hours as needed for nausea or vomiting. 40 tablet 1  . simvastatin (ZOCOR) 40 MG tablet TAKE 1 TABLET BY MOUTH AT BEDTIME 90 tablet 0  . traMADol (ULTRAM) 50 MG tablet TAKE 1 TABLET BY MOUTH 3 TIMES DAILY AS NEEDED FOR MODERATE PAIN 90 tablet 0  . Vitamin E 450 MG (1000 UT) CAPS Take 450 Units by mouth daily.    . Zinc 50 MG TABS Take 50 mg by mouth daily.     No current facility-administered medications for this visit.   Facility-Administered Medications Ordered in Other Visits  Medication Dose Route Frequency Provider Last Rate Last Admin  . heparin lock flush 100 UNIT/ML injection               .  PHYSICAL EXAMINATION: ECOG PERFORMANCE STATUS: 0 - Asymptomatic  Vitals:   02/14/21 0940  BP: 117/67  Pulse: 73  Resp: 16  Temp: 97.8 F (36.6 C)  SpO2: 95%    Filed Weights   02/14/21 0940  Weight: 214 lb 6.4 oz (97.3 kg)      Physical Exam Constitutional:      Comments: Ambulatory independently; accompanied by his daughter  HENT:  Head: Normocephalic and atraumatic.     Mouth/Throat:     Pharynx: No oropharyngeal exudate.  Eyes:     Pupils: Pupils are equal, round, and reactive to light.  Cardiovascular:     Rate and Rhythm: Normal rate and regular rhythm.  Pulmonary:     Effort: No respiratory distress.     Breath sounds: No wheezing.  Abdominal:     General: Bowel sounds are normal. There is no distension.      Palpations: Abdomen is soft. There is no mass.     Tenderness: There is no abdominal tenderness. There is no guarding or rebound.  Musculoskeletal:        General: No tenderness. Normal range of motion.     Cervical back: Normal range of motion and neck supple.  Skin:    General: Skin is warm.  Neurological:     Mental Status: He is alert and oriented to person, place, and time.  Psychiatric:        Mood and Affect: Affect normal.     LABORATORY DATA:  I have reviewed the data as listed Lab Results  Component Value Date   WBC 9.5 02/14/2021   HGB 13.3 02/14/2021   HCT 39.1 02/14/2021   MCV 93.3 02/14/2021   PLT 286 02/14/2021   Recent Labs    02/04/21 1036 02/13/21 0853 02/14/21 0846  NA 134* 131* 136  K 4.2 4.5 4.5  CL 104 101 105  CO2 23 23 25   GLUCOSE 108* 107* 111*  BUN 22 24* 21  CREATININE 0.87 0.93 0.82  CALCIUM 8.8* 8.8* 9.3  GFRNONAA >60 >60 >60  PROT 6.7 7.0 7.1  ALBUMIN 4.2 4.1 4.2  AST 17 15 15   ALT 17 12 12   ALKPHOS 93 66 65  BILITOT 0.3 0.4 0.6    RADIOGRAPHIC STUDIES: I have personally reviewed the radiological images as listed and agreed with the findings in the report. MR BRAIN W WO CONTRAST  Result Date: 02/14/2021 CLINICAL DATA:  Extreme fatigue, weakness, concern for hypophysitis EXAM: MRI HEAD WITHOUT AND WITH CONTRAST TECHNIQUE: Multiplanar, multiecho pulse sequences of the brain and surrounding structures were obtained without and with intravenous contrast. CONTRAST:  65mL GADAVIST GADOBUTROL 1 MMOL/ML IV SOLN COMPARISON:  Brain MRI 10/27/2020 FINDINGS: Brain: There is no evidence of acute intracranial hemorrhage, extra-axial fluid collection, or infarct. Scattered foci of FLAIR signal abnormality throughout the subcortical and periventricular white matter are nonspecific but likely reflect sequela of chronic white matter microangiopathy. There is no suspicious parenchymal signal abnormality. There is no mass lesion. There is no midline  shift. The pituitary and pituitary stalk are normal. Vascular: Normal flow voids. Skull and upper cervical spine: Normal marrow signal. Sinuses/Orbits: The imaged paranasal sinuses are clear. Bilateral lens implants are in place. The globes and orbits are otherwise unremarkable. Other: None. IMPRESSION: No acute intracranial pathology.  No evidence of hypophysitis. Electronically Signed   By: Valetta Mole M.D.   On: 02/14/2021 14:49   VAS [place] ABI WITH/WO TBI  Result Date: 01/31/2021  LOWER EXTREMITY DOPPLER STUDY Patient Name:  Randall Ali.  Date of Exam:   01/31/2021 Medical Rec #: [id number]            Accession #:    [id number] Date of Birth: Jun 17, 1940            Patient Gender: M Patient Age:   61 years Exam Location:  [facility] Procedure:  VAS [place] ABI WITH/WO TBI Referring Phys: [facility] --------------------------------------------------------------------------------  Indications: Peripheral artery disease, and worsening right calf pain. Other Factors: S/P Interventions.  Vascular Interventions: 01/15/2021 PTA and stent of Rt SFA and popliteal artery. Comparison Study: 12/26/2020 Performing Technologist: Charlane Ferretti RT (R)(VS)  Examination Guidelines: A complete evaluation includes at minimum, Doppler waveform signals and systolic blood pressure reading at the level of bilateral brachial, anterior tibial, and posterior tibial arteries, when vessel segments are accessible. Bilateral testing is considered an integral part of a complete examination. Photoelectric Plethysmograph (PPG) waveforms and toe systolic pressure readings are included as required and additional duplex testing as needed. Limited examinations for reoccurring indications may be performed as noted.  ABI Findings: +---------+------------------+-----+---------+--------+ Right    Rt Pressure (mmHg)IndexWaveform Comment  +---------+------------------+-----+---------+--------+ Brachial 124                                       +---------+------------------+-----+---------+--------+ ATA      119                    biphasic          +---------+------------------+-----+---------+--------+ PTA      137               1.10 triphasic         +---------+------------------+-----+---------+--------+ Great Toe108               0.87 Normal            +---------+------------------+-----+---------+--------+ +---------+------------------+-----+---------+-------+ Left     Lt Pressure (mmHg)IndexWaveform Comment +---------+------------------+-----+---------+-------+ Brachial 124                                     +---------+------------------+-----+---------+-------+ ATA      116                    biphasic         +---------+------------------+-----+---------+-------+ PTA      11                0.09 triphasic        +---------+------------------+-----+---------+-------+ Great Toe94                0.76 Normal           +---------+------------------+-----+---------+-------+ +-------+-----------+-----------+------------+------------+ ABI/TBIToday's ABIToday's TBIPrevious ABIPrevious TBI +-------+-----------+-----------+------------+------------+ Right  1.10       .87        .51         .40          +-------+-----------+-----------+------------+------------+ Left   .98        .76        .96         .86          +-------+-----------+-----------+------------+------------+ Right ABIs appear increased compared to prior study on 12/26/2020. Left ABIs appear essentially unchanged compared to prior study on 12/26/2020.  Summary: Right: Resting right ankle-brachial index is within normal range. No evidence of significant right lower extremity arterial disease. The right toe-brachial index is normal. Right TBI appears increased as compared to the previous exam on 12/26/2020. Left: Resting left ankle-brachial index is within normal range. No evidence of significant left lower  extremity arterial disease. The left toe-brachial index is normal. Left TBI appears essentially unchanged as compared to the previous exam  on 12/26/2020.  *See table(s) above for measurements and observations.  Electronically signed by Hortencia Pilar MD on 01/31/2021 at 4:23:18 PM.    Final      ASSESSMENT & PLAN:   Cancer of lower lobe of left lung ([place]) #Lung cancer-non-small cell stage IV [right suprarenal nodule-s/p biopsy "non-small cell ca"].  Currently on CarboTaxol plus Keytruda. CT AUG 5th, 2022-partial response [ subpleural mass of the superior segment left lower lobe is decreased in size. An adjacent nodule of the left lower lobe abutting the fissure is almost completely resolved;  Interval decrease in size of enlarged left hilar lymph node; Interval decrease in size of a metastatic soft tissue nodule adjacent to the superior pole of the right kidney]  # currently s/p  cycle #4-CarboTaxol Keytruda today-hold chemotherapy today given the significant fatigue.  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  Check brain MRI STAT.  Prednisone 20 mg a day  #Right upper lobe new groundglass opacity [AUG 8th CT chest]-suspect inflammatory.  Monitor for now.   # CAD-s/p CABG- STABLE; no decompensation   # PVD-s/p stent [Dr.Schneir]-improvement in pain noted.  On Plavix-STABLE.   # Skin rash- back/torso- G-1-2-itching- not improved on hydrocortisone; add kenalog. Benadryl 25 mg PO qhs.   # LEFT UE- Soft tissue mass- ? metastses vs trauma- await US soft tissue [ordered by Josh]  # DISPOSITION: # STAT MRI Brain # HOLD keytruda today. # IVFs; Dex 10 mg x1 today #  Follow up in 1 week- MD; labs- cbc/mp; No chemo-Dr.B  Addendum: Brain MRI negative for any acute intracranial pathology.  Reviewed the results of the MRI.  Patient slightly improved after dexamethasone in the clinic today.  Continue prednisone 20 mg once  a day.  I suspect patient will need a break from chemotherapy.  Follow-up as planned.    All  questions were answered. The patient knows to call the clinic with any problems, questions or concerns.    Cammie Sickle, MD 02/14/2021 3:24 PM

## 2021-02-14 NOTE — Progress Notes (Signed)
Pt reports lack of energy x 3 weeks. Pt has "knot" on left forearm that was hurting him over the weekend. Pt waiting for ultrasound appt for arm.

## 2021-02-21 DIAGNOSIS — M9903 Segmental and somatic dysfunction of lumbar region: Secondary | ICD-10-CM | POA: Diagnosis not present

## 2021-02-21 DIAGNOSIS — M9905 Segmental and somatic dysfunction of pelvic region: Secondary | ICD-10-CM | POA: Diagnosis not present

## 2021-02-21 DIAGNOSIS — M62830 Muscle spasm of back: Secondary | ICD-10-CM | POA: Diagnosis not present

## 2021-02-21 DIAGNOSIS — M955 Acquired deformity of pelvis: Secondary | ICD-10-CM | POA: Diagnosis not present

## 2021-02-22 ENCOUNTER — Inpatient Hospital Stay: Payer: PPO

## 2021-02-22 ENCOUNTER — Inpatient Hospital Stay: Payer: PPO | Admitting: Internal Medicine

## 2021-02-22 ENCOUNTER — Other Ambulatory Visit: Payer: Self-pay

## 2021-02-22 ENCOUNTER — Ambulatory Visit
Admission: RE | Admit: 2021-02-22 | Discharge: 2021-02-22 | Disposition: A | Discharge status: Home / Self Care | Payer: PPO | Source: Ambulatory Visit | Attending: Hospice and Palliative Medicine | Admitting: Hospice and Palliative Medicine

## 2021-02-22 ENCOUNTER — Encounter: Payer: Self-pay | Admitting: Internal Medicine

## 2021-02-22 VITALS — BP 124/63 | HR 75 | Temp 96.2°F | Resp 18 | Wt 215.2 lb

## 2021-02-22 DIAGNOSIS — C3432 Malignant neoplasm of lower lobe, left bronchus or lung: Secondary | ICD-10-CM

## 2021-02-22 DIAGNOSIS — I82612 Acute embolism and thrombosis of superficial veins of left upper extremity: Secondary | ICD-10-CM | POA: Diagnosis not present

## 2021-02-22 DIAGNOSIS — R2232 Localized swelling, mass and lump, left upper limb: Secondary | ICD-10-CM | POA: Diagnosis not present

## 2021-02-22 DIAGNOSIS — I808 Phlebitis and thrombophlebitis of other sites: Secondary | ICD-10-CM | POA: Diagnosis not present

## 2021-02-22 LAB — CBC WITH DIFFERENTIAL/PLATELET
Abs Immature Granulocytes: 0.11 10*3/uL — ABNORMAL HIGH (ref 0.00–0.07)
Basophils Absolute: 0.1 10*3/uL (ref 0.0–0.1)
Basophils Relative: 1 %
Eosinophils Absolute: 0.2 10*3/uL (ref 0.0–0.5)
Eosinophils Relative: 2 %
HCT: 39.4 % (ref 39.0–52.0)
Hemoglobin: 13.6 g/dL (ref 13.0–17.0)
Immature Granulocytes: 1 %
Lymphocytes Relative: 28 %
Lymphs Abs: 2.4 10*3/uL (ref 0.7–4.0)
MCH: 32.2 pg (ref 26.0–34.0)
MCHC: 34.5 g/dL (ref 30.0–36.0)
MCV: 93.4 fL (ref 80.0–100.0)
Monocytes Absolute: 1 10*3/uL (ref 0.1–1.0)
Monocytes Relative: 12 %
Neutro Abs: 4.8 10*3/uL (ref 1.7–7.7)
Neutrophils Relative %: 56 %
Platelets: 205 10*3/uL (ref 150–400)
RBC: 4.22 MIL/uL (ref 4.22–5.81)
RDW: 14.3 % (ref 11.5–15.5)
WBC: 8.6 10*3/uL (ref 4.0–10.5)
nRBC: 0 % (ref 0.0–0.2)

## 2021-02-22 LAB — COMPREHENSIVE METABOLIC PANEL
ALT: 14 U/L (ref 0–44)
AST: 15 U/L (ref 15–41)
Albumin: 4.1 g/dL (ref 3.5–5.0)
Alkaline Phosphatase: 56 U/L (ref 38–126)
Anion gap: 10 (ref 5–15)
BUN: 24 mg/dL — ABNORMAL HIGH (ref 8–23)
CO2: 23 mmol/L (ref 22–32)
Calcium: 8.9 mg/dL (ref 8.9–10.3)
Chloride: 102 mmol/L (ref 98–111)
Creatinine, Ser: 1.06 mg/dL (ref 0.61–1.24)
GFR, Estimated: >60 mL/min (ref >60)
Glucose, Bld: 132 mg/dL — ABNORMAL HIGH (ref 70–99)
Potassium: 3.9 mmol/L (ref 3.5–5.1)
Sodium: 135 mmol/L (ref 135–145)
Total Bilirubin: 0.7 mg/dL (ref 0.3–1.2)
Total Protein: 6.8 g/dL (ref 6.5–8.1)

## 2021-02-22 IMAGING — US US EXTREM UP*L* LTD
1 series · 9 of 9 positions shown · non-contrast
Comparison: None

CLINICAL DATA: Cord like left arm mass.

EXAM:
ULTRASOUND LEFT UPPER EXTREMITY LIMITED
TECHNIQUE: Ultrasound examination of the upper extremity soft tissues was
performed in the area of clinical concern.

[Series 1: us soft tissue upper extremity limited left (non-v · 9 of 9 slices shown]
[im 1/9]
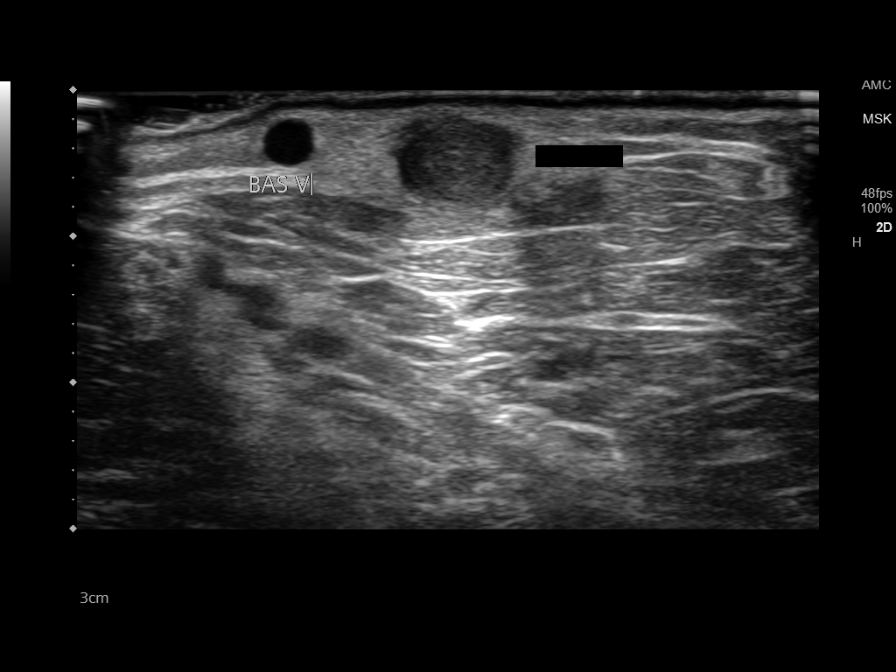
[im 2/9]
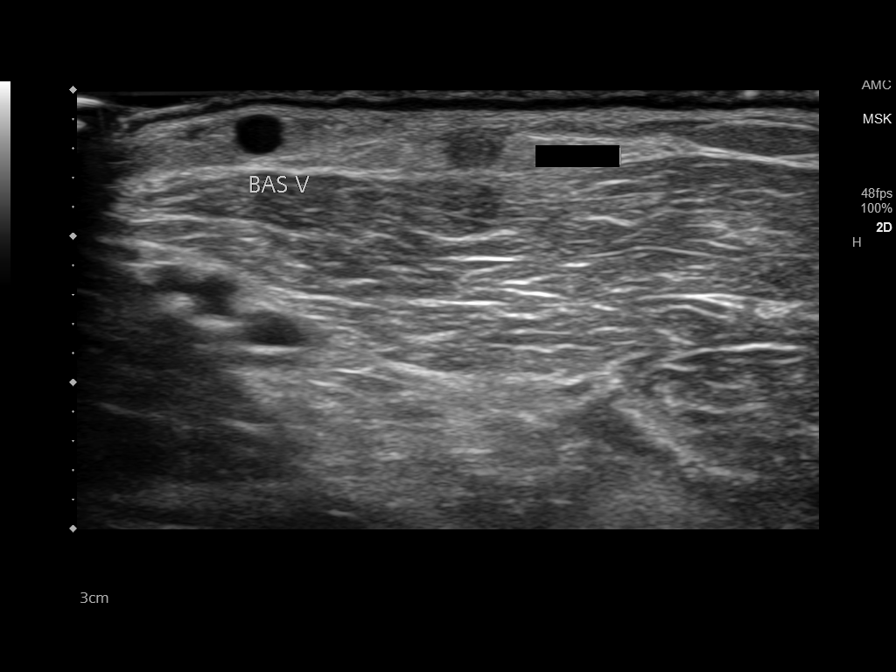
[im 3/9]
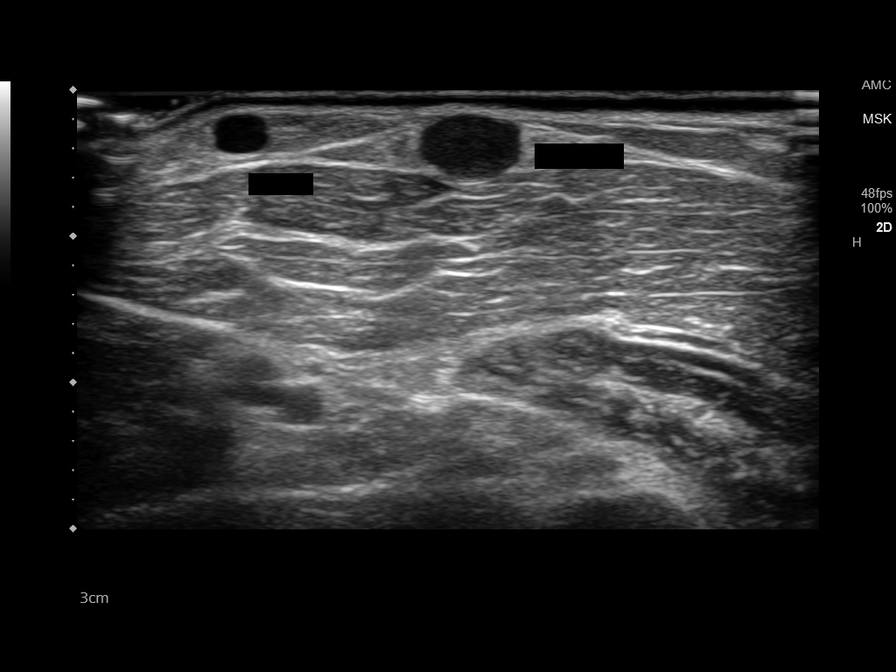
[im 4/9]
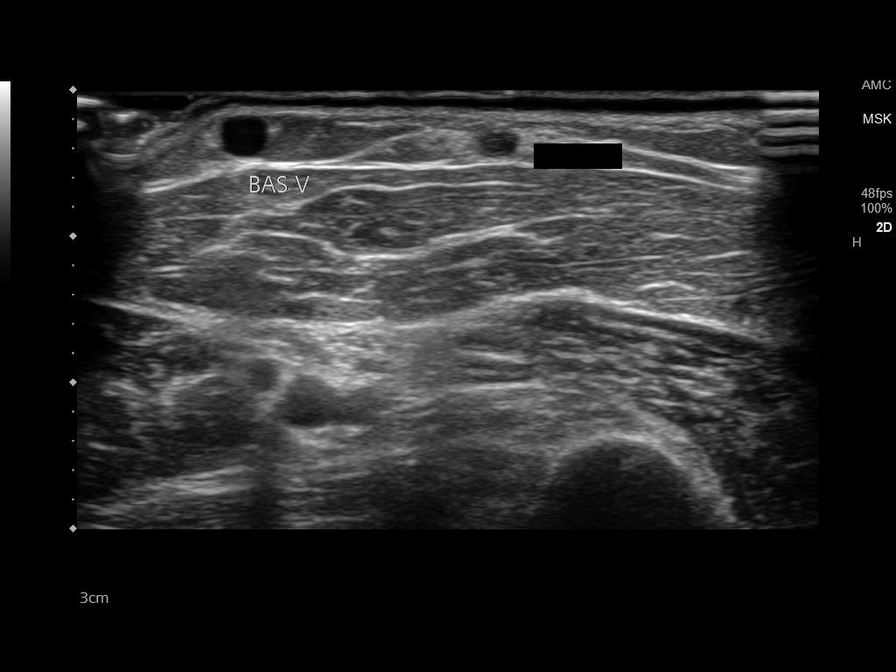
[im 5/9]
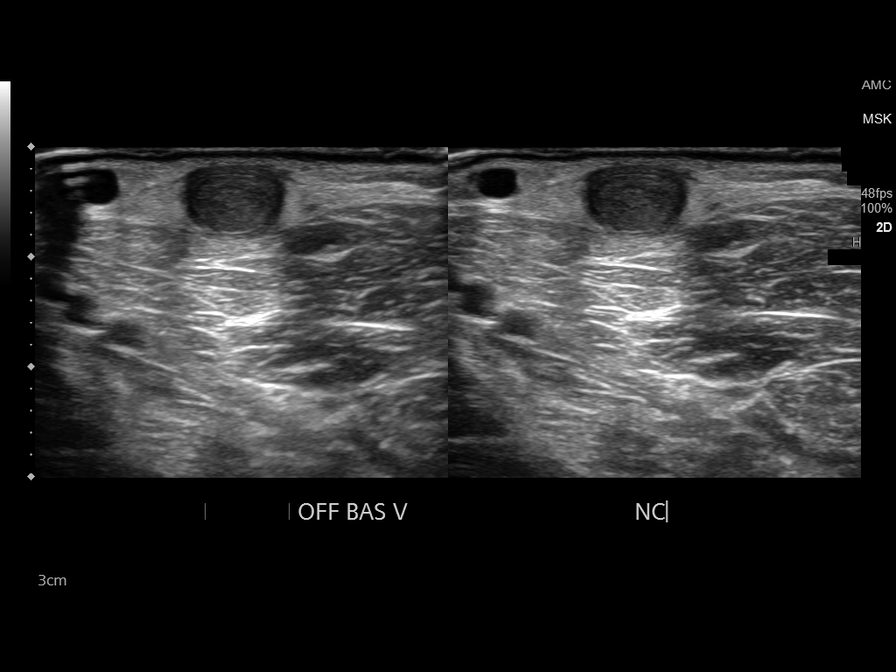
[im 6/9]
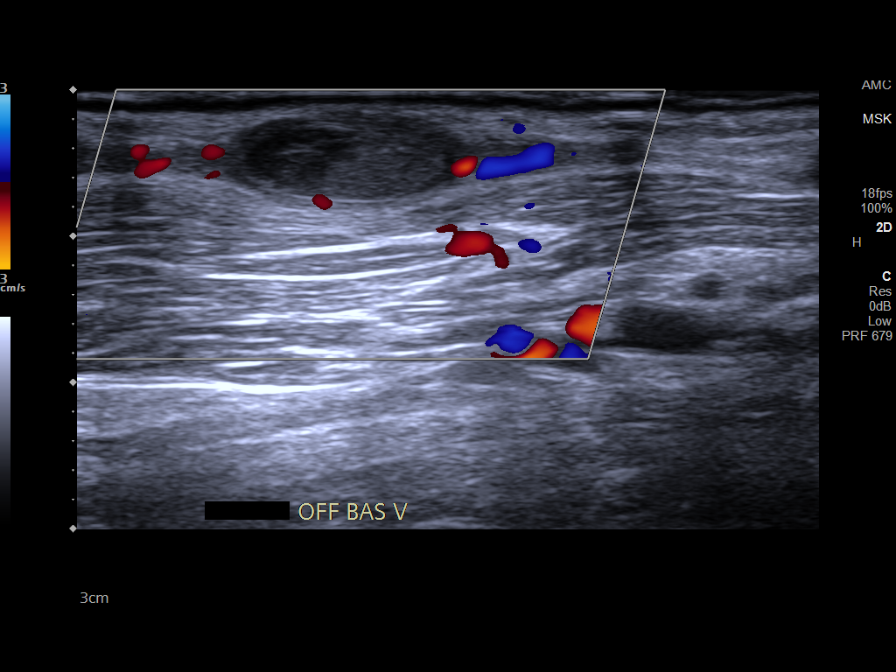
[im 7/9]
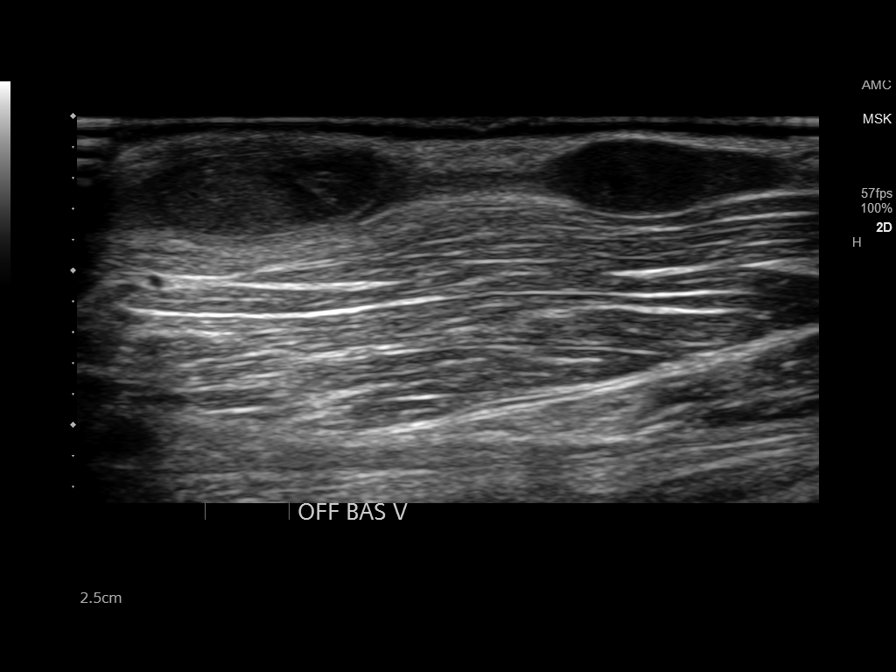
[im 8/9]
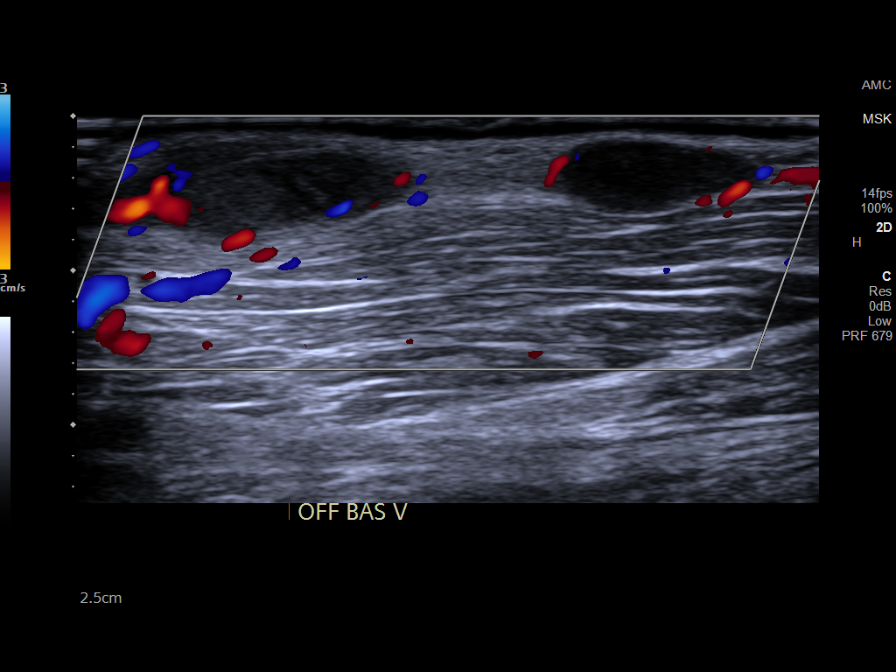
[im 9/9]
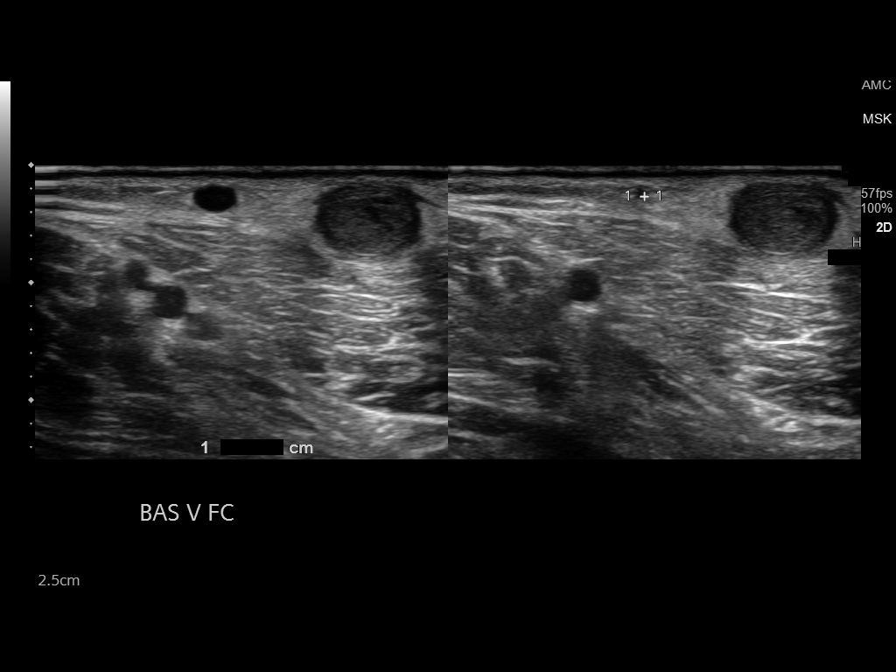

[9 of 9 positions shown; findings below may reference images not displayed]

FINDINGS: Examination of the superficial soft tissues of the left upper
extremity in the region of clinical concern demonstrate an occluded
superficial venous branch of the basilic vein at the level of the
mid forearm.
IMPRESSION: Evidence of superficial thrombophlebitis of a superficial basilic
vein branch in the region palpable abnormality of the left mid
forearm. This examination was only limited to this specific
abnormality and was not performed as a complete left upper extremity
venous duplex study. Left upper extremity deep vein thrombosis
cannot be excluded based on this study.

## 2021-02-22 MED ORDER — PREDNISONE 5 MG PO TABS: ORAL_TABLET | ORAL | 0 refills | Status: DC

## 2021-02-22 NOTE — Progress Notes (Signed)
[place] NOTE  Patient Care Team: Ria Bush, MD as PCP - General (Family Medicine) [name] [name], MD as PCP - Cardiology (Cardiology) [name], MD (Internal Medicine) Minna Merritts, MD as Consulting Physician (Cardiology) Pieter Partridge, DO as Consulting Physician (Neurology) Cammie Sickle, MD as Consulting Physician (Hematology and Oncology) Telford Nab, RN as Oncology Nurse Navigator  CHIEF COMPLAINTS/PURPOSE OF CONSULTATION: lung cancer    Oncology History Overview Note  #MAY 2022-Lung cancer-non-small cell [CT guided bx] T3N1 vs stage IV Dr.Hendrickson.  MRI brain negative for malignancy.# 1. April 2022- LLL ~4.0 cm mass in the superior segment left lower lobe abuts the major fissure and the posterior pleural surface without visible chest wall invasion without pleural effusion. There 2-3 other small nodules in the left lower lobe, largest measures 9 mm in diameter. These are suspicious for same lobe satellite lesions and there is left hilar adenopathy. Assuming non-small cell lung cancer the appearance is compatible with T3 N1 M0 disease (stage IIIA).  # right suprarenal nodule-awaiting biopsy on 5/31- non-small cell [revived at tumor conference at [facility]  # June 9th 2022- CARBO-TAXOl-KEYTRUDA; Fulphila  MOLECULAR TESTING: NGS TPS-PDL 100%; EXON 12 amplification* mutations.    # CAD [CABG 2016; Dr.Gollan]   Cancer of lower lobe of left lung ([place])  11/02/2020 Initial Diagnosis   Cancer of lower lobe of left lung ([place])   11/02/2020 Cancer Staging   Staging form: Lung, AJCC 8th Edition - Clinical: Stage IVA (cT3, cN1, cM1a) - Signed by Cammie Sickle, MD on 11/02/2020   11/16/2020 -  Chemotherapy    Patient is on Treatment Plan: LUNG NSCLC CARBOPLATIN + PACLITAXEL + PEMBROLIZUMAB Q21D X 4 CYCLES / PEMBROLIZUMAB MAINTENANCE Q21D          HISTORY OF PRESENTING ILLNESS:  [name] 80 y.o.  male lung  cancer-non-small cell stage IV [supra-renal nodule s/p biopsy] currently on CarboTaxol Keytruda status post cycle #4 is here for follow-up/review results of the brain MRI.  Patient denies any worsening fatigue.  Patient currently on prednisone 20 mg a day noted to have some improvement.  Not Resolved.  No headaches.  No vision changes.  Concern for gait instability.  Review of Systems  Constitutional:  Positive for malaise/fatigue. Negative for chills, diaphoresis, fever and weight loss.  HENT:  Negative for nosebleeds and sore throat.   Eyes:  Negative for double vision.  Respiratory:  Negative for cough, hemoptysis, sputum production, shortness of breath and wheezing.   Cardiovascular:  Negative for chest pain, palpitations, orthopnea and leg swelling.  Gastrointestinal:  Negative for abdominal pain, blood in stool, constipation, diarrhea, heartburn, melena, nausea and vomiting.  Genitourinary:  Negative for dysuria, frequency and urgency.  Musculoskeletal:  Positive for back pain and joint pain.  Skin: Negative.  Negative for itching and rash.  Neurological:  Negative for dizziness, tingling, focal weakness, weakness and headaches.  Endo/Heme/Allergies:  Does not bruise/bleed easily.  Psychiatric/Behavioral:  Negative for depression. The patient is not nervous/anxious and does not have insomnia.     MEDICAL HISTORY:  Past Medical History:  Diagnosis Date   Allergy    seasonal   Arthritis    all over- in general    CAD (coronary artery disease)    a. inferior wall MI 10/01 s/p PCI/DES to RCA; b. Myoview 3/16 neg for ischemia; c. LHC 8/16: ostLAD 80%, OM1 70%, OM2 70% x 2 lesions, mRCA 30%, dRCA 70% s/p 4-V CABG  01/24/15 (LIMA-LAD, VG- OM1, VG-OM2, VG-PDA)    Cancer ([place])    skin, melanoma   Carotid artery disease ([place])    a. [place] 8/16: 1-39% bilateral ICA stenosis   Cataract    removed   Diastolic dysfunction    a. TTE 8/16: EF 55-60%, no RWMA, Gr1DD, calcified mitral annulus, mild  biatrial enlargement   Erectile dysfunction    GERD (gastroesophageal reflux disease)    History of elbow surgery    History of hiatal hernia    HLD (hyperlipidemia)    HTN (hypertension)    Inferior myocardial infarction ([place]) 03/2000   stent RCA   Lung cancer ([place])    Postoperative wound infection 02/02/2015   Reflux esophagitis    Sleep apnea 2017   CPAP at night    SURGICAL HISTORY: Past Surgical History:  Procedure Laterality Date   arm surgery  2010   BROW LIFT Bilateral 11/25/2019   Procedure: BROW PTOSIS REPAIR BILATERAL;  Surgeon: Karle Starch, MD;  Location: [facility];  Service: Ophthalmology;  Laterality: Bilateral;  sleep apnea   CARDIAC CATHETERIZATION  06/24/2011   CARDIAC CATHETERIZATION N/A 01/18/2015   Procedure: Left Heart Cath with coronary angiography;  Surgeon: Minna Merritts, MD;  Location: [facility];  Service: Cardiovascular;  Laterality: N/A;   CARDIAC CATHETERIZATION N/A 01/18/2015   Procedure: Intravascular Pressure Wire/FFR Study;  Surgeon: [name], MD;  Location: [facility];  Service: Cardiovascular;  Laterality: N/A;   CAROTID STENT  03/10/2011   COLONOSCOPY  2010   COLONOSCOPY  06/14/2014   Dr Hilarie Fredrickson   CORONARY ARTERY BYPASS GRAFT N/A 01/24/2015   Procedure: CORONARY ARTERY BYPASS GRAFTING x 4 (LIMA-LAD, SVG-Int 1- Int 2, SVG-PD) ENDOSCOPIC GREATER SAPHENOUS VEIN HARVEST LEFT LEG;  Surgeon: [name], MD;  Location: [facility];  Service: Open Heart Surgery;  Laterality: N/A;   EMBOLECTOMY  06/15/2019   Procedure: EMBOLECTOMY;  Surgeon: Katha Cabal, MD;  Location: [facility];  Service: Vascular;;  right superficial femoral artery   ENDARTERECTOMY FEMORAL Right 06/15/2019   Procedure: ENDARTERECTOMY FEMORAL;  Surgeon: Katha Cabal, MD;  Location: [facility];  Service: Vascular;  Laterality: Right;  common femoral profunda femoris superficial femoral   ESOPHAGOGASTRODUODENOSCOPY (EGD) WITH  PROPOFOL N/A 04/24/2016   Procedure: ESOPHAGOGASTRODUODENOSCOPY (EGD) WITH PROPOFOL;  Surgeon: Jerene Bears, MD;  Location: [facility];  Service: Gastroenterology;  Laterality: N/A;   EYE SURGERY     lasik 15 yrs. ago, cataracts removed - both eyes    HAMMER TOE SURGERY     right toe   INSERTION OF ILIAC STENT Right 06/15/2019   Procedure: INSERTION OF ILIAC STENT ( STENTING OF SFA/POP ARTERY );  Surgeon: Katha Cabal, MD;  Location: [facility];  Service: Vascular;  Laterality: Right;  angioplpasty and stent placement: right superficial femoral right tibiopopliteal trunk bilateral common iliac arteries   IR IMAGING GUIDED PORT INSERTION  11/09/2020   LEFT HEART CATH AND CORONARY ANGIOGRAPHY Left 06/10/2017   Procedure: LEFT HEART CATH AND CORONARY ANGIOGRAPHY;  Surgeon: Minna Merritts, MD;  Location: [facility];  Service: Cardiovascular;  Laterality: Left;   LOWER EXTREMITY ANGIOGRAPHY Left 01/04/2019   Procedure: LOWER EXTREMITY ANGIOGRAPHY;  Surgeon: Katha Cabal, MD;  Location: [facility];  Service: Cardiovascular;  Laterality: Left;   LOWER EXTREMITY ANGIOGRAPHY Right 01/25/2019   Procedure: LOWER EXTREMITY ANGIOGRAPHY;  Surgeon: Katha Cabal, MD;  Location: [facility];  Service: Cardiovascular;  Laterality: Right;   LOWER EXTREMITY ANGIOGRAPHY Right 01/15/2021   LOWER EXTREMITY ANGIOGRAPHY and stent placement to R SFA and popliteal artery Delana Meyer, Dolores Lory, MD)   NASAL SINUS SURGERY  2008   septpolasty, bilateral turbinate reduction   SHOULDER ARTHROSCOPY  2012   TEE WITHOUT CARDIOVERSION N/A 01/24/2015   Procedure: TRANSESOPHAGEAL ECHOCARDIOGRAM (TEE);  Surgeon: [name], MD;  Location: [facility];  Service: Open Heart Surgery;  Laterality: N/A;   TOE SURGERY  1994   UPPER GI ENDOSCOPY  07/2014, 04-24-16   Dr Raquel James   WRIST SURGERY  2011    SOCIAL HISTORY: Social History   Socioeconomic History   Marital status: Single     Spouse name: Not on file   Number of children: Not on file   Years of education: 12   Highest education level: Not on file  Occupational History   Occupation: retired    Comment: [employer]  Tobacco Use   Smoking status: Former    Packs/day: 2.50    Years: 40.00    Pack years: 100.00    Types: Cigarettes    Quit date: 03/24/2000    Years since quitting: 20.9   Smokeless tobacco: Never  Vaping Use   Vaping Use: Never used  Substance and Sexual Activity   Alcohol use: Yes    Comment: occassionally   Drug use: No   Sexual activity: Yes  Other Topics Concern   Not on file  Social History Narrative   Singled; lives with son and dog    Occ: retired, back part time at [employer];    Activity: gym 4-5d/wk   Diet: good water, fruits/vegetables daily   Caffeine Use-yes      ------------------------------------       Car sales- retd; [employer]- retd; quit smoking 2001. Alcohol couple nights a week. Live in [place]. Daughter lives 10 mins.    Social Determinants of Health   Financial Resource Strain: Low Risk    Difficulty of Paying Living Expenses: Not hard at all  Food Insecurity: No Food Insecurity   Worried About Charity fundraiser in the Last Year: Never true   [place] in the Last Year: Never true  Transportation Needs: No Transportation Needs   Lack of Transportation (Medical): No   Lack of Transportation (Non-Medical): No  Physical Activity: Sufficiently Active   Days of Exercise per Week: 7 days   Minutes of Exercise per Session: 60 min  Stress: No Stress Concern Present   Feeling of Stress : Not at all  Social Connections: Not on file  Intimate Partner Violence: Not At Risk   Fear of Current or Ex-Partner: No   Emotionally Abused: No   Physically Abused: No   Sexually Abused: No    FAMILY HISTORY: Family History  Problem Relation Age of Onset   Hypertension Mother    Heart disease Mother    Hypertension Father    Diabetes Father    Lymphoma  Sister    Heart disease Brother 1   Cancer Paternal Grandfather    Colon cancer Neg Hx    Prostate cancer Neg Hx    Bladder Cancer Neg Hx    Kidney cancer Neg Hx     ALLERGIES:  is allergic to flomax [tamsulosin].  MEDICATIONS:  Current Outpatient Medications  Medication Sig Dispense Refill   acetaminophen (TYLENOL) 325 MG tablet Take 650 mg by mouth as needed for moderate pain or mild pain.  Ascorbic Acid (VITAMIN C) 1000 MG tablet Take 1,000 mg by mouth daily.     aspirin 81 MG EC tablet Take 81 mg by mouth daily.       Calcium-Magnesium-Vitamin D (CALCIUM 1200+D3 PO) Take 1 tablet by mouth daily.     Carboxymethylcellul-Glycerin (LUBRICATING EYE DROPS OP) Place 1 drop into both eyes daily as needed (irritation).     Cholecalciferol (VITAMIN D3) 50 MCG (2000 UT) TABS Take 2,000 Units by mouth daily.     clopidogrel (PLAVIX) 75 MG tablet Take 1 tablet (75 mg total) by mouth daily. 30 tablet 11   Cyanocobalamin (B-12) 5000 MCG CAPS Take 5,000 mcg by mouth daily.     docusate sodium (COLACE) 100 MG capsule Take 200 mg by mouth at bedtime.     lidocaine-prilocaine (EMLA) cream Apply 30 -45 mins prior to port access. 30 g 0   lisinopril (ZESTRIL) 5 MG tablet TAKE 1 TABLET BY MOUTH DAILY 90 tablet 3   loratadine (CLARITIN) 10 MG tablet Take 10 mg by mouth daily.     Melatonin 10 MG CAPS Take 10 mg by mouth at bedtime as needed (sleep).     metoprolol succinate (TOPROL-XL) 25 MG 24 hr tablet TAKE 1/2 TABLET (12.5 MG) BY MOUTH DAILY 45 tablet 3   Niacinamide-Zn-Cu-Methfo-Se-Cr (NICOTINAMIDE PO) Take 500 mg by mouth daily.     nitroGLYCERIN (NITROSTAT) 0.4 MG SL tablet Place 1 tablet (0.4 mg total) under the tongue every 5 (five) minutes as needed for up to 25 doses for chest pain. 25 tablet 1   ondansetron (ZOFRAN) 8 MG tablet One pill every 8 hours as needed for nausea/vomitting. 40 tablet 1   pantoprazole (PROTONIX) 40 MG tablet TAKE 1 TABLET BY MOUTH DAILY (Patient taking differently:  Take 40 mg by mouth daily.) 90 tablet 3   Polyethyl Glycol-Propyl Glycol (LUBRICANT EYE DROPS) 0.4-0.3 % SOLN Place 1-2 drops into both eyes 3 (three) times daily as needed (burning eyes.).     predniSONE (DELTASONE) 20 MG tablet Take 1 tablet (20 mg total) by mouth daily with breakfast. Once a day with food x 10 days. 10 tablet 0   predniSONE (DELTASONE) 5 MG tablet Take 2 pills/day x 1 week; and then 1 a day. Take in AM with Breakfast. 21 tablet 0   prochlorperazine (COMPAZINE) 10 MG tablet Take 1 tablet (10 mg total) by mouth every 6 (six) hours as needed for nausea or vomiting. 40 tablet 1   simvastatin (ZOCOR) 40 MG tablet TAKE 1 TABLET BY MOUTH AT BEDTIME 90 tablet 0   traMADol (ULTRAM) 50 MG tablet TAKE 1 TABLET BY MOUTH 3 TIMES DAILY AS NEEDED FOR MODERATE PAIN 90 tablet 0   Vitamin E 450 MG (1000 UT) CAPS Take 450 Units by mouth daily.     Zinc 50 MG TABS Take 50 mg by mouth daily.     No current facility-administered medications for this visit.   Facility-Administered Medications Ordered in Other Visits  Medication Dose Route Frequency Provider Last Rate Last Admin   heparin lock flush 100 UNIT/ML injection               .  PHYSICAL EXAMINATION: ECOG PERFORMANCE STATUS: 0 - Asymptomatic  Vitals:   02/22/21 1451  BP: 124/63  Pulse: 75  Resp: 18  Temp: (!) 96.2 F (35.7 C)  SpO2: 97%    Filed Weights   02/22/21 1451  Weight: 215 lb 3.2 oz (97.6 kg)  Physical Exam Constitutional:      Comments: Ambulatory independently; accompanied by his daughter  HENT:     Head: Normocephalic and atraumatic.     Mouth/Throat:     Pharynx: No oropharyngeal exudate.  Eyes:     Pupils: Pupils are equal, round, and reactive to light.  Cardiovascular:     Rate and Rhythm: Normal rate and regular rhythm.  Pulmonary:     Effort: No respiratory distress.     Breath sounds: No wheezing.  Abdominal:     General: Bowel sounds are normal. There is no distension.      Palpations: Abdomen is soft. There is no mass.     Tenderness: There is no abdominal tenderness. There is no guarding or rebound.  Musculoskeletal:        General: No tenderness. Normal range of motion.     Cervical back: Normal range of motion and neck supple.  Skin:    General: Skin is warm.  Neurological:     Mental Status: He is alert and oriented to person, place, and time.  Psychiatric:        Mood and Affect: Affect normal.     LABORATORY DATA:  I have reviewed the data as listed Lab Results  Component Value Date   WBC 8.6 02/22/2021   HGB 13.6 02/22/2021   HCT 39.4 02/22/2021   MCV 93.4 02/22/2021   PLT 205 02/22/2021   Recent Labs    02/13/21 0853 02/14/21 0846 02/22/21 1435  NA 131* 136 135  K 4.5 4.5 3.9  CL 101 105 102  CO2 23 25 23   GLUCOSE 107* 111* 132*  BUN 24* 21 24*  CREATININE 0.93 0.82 1.06  CALCIUM 8.8* 9.3 8.9  GFRNONAA >60 >60 >60  PROT 7.0 7.1 6.8  ALBUMIN 4.1 4.2 4.1  AST 15 15 15   ALT 12 12 14   ALKPHOS 66 65 56  BILITOT 0.4 0.6 0.7    RADIOGRAPHIC STUDIES: I have personally reviewed the radiological images as listed and agreed with the findings in the report. MR BRAIN W WO CONTRAST  Result Date: 02/14/2021 CLINICAL DATA:  Extreme fatigue, weakness, concern for hypophysitis EXAM: MRI HEAD WITHOUT AND WITH CONTRAST TECHNIQUE: Multiplanar, multiecho pulse sequences of the brain and surrounding structures were obtained without and with intravenous contrast. CONTRAST:  63mL GADAVIST GADOBUTROL 1 MMOL/ML IV SOLN COMPARISON:  Brain MRI 10/27/2020 FINDINGS: Brain: There is no evidence of acute intracranial hemorrhage, extra-axial fluid collection, or infarct. Scattered foci of FLAIR signal abnormality throughout the subcortical and periventricular white matter are nonspecific but likely reflect sequela of chronic white matter microangiopathy. There is no suspicious parenchymal signal abnormality. There is no mass lesion. There is no midline shift.  The pituitary and pituitary stalk are normal. Vascular: Normal flow voids. Skull and upper cervical spine: Normal marrow signal. Sinuses/Orbits: The imaged paranasal sinuses are clear. Bilateral lens implants are in place. The globes and orbits are otherwise unremarkable. Other: None. IMPRESSION: No acute intracranial pathology.  No evidence of hypophysitis. Electronically Signed   By: Valetta Mole M.D.   On: 02/14/2021 14:49   VAS [place] ABI WITH/WO TBI  Result Date: 01/31/2021  LOWER EXTREMITY DOPPLER STUDY Patient Name:  TAELOR MONCADA.  Date of Exam:   01/31/2021 Medical Rec #: [id number]            Accession #:    [id number] Date of Birth: 1941-05-21            Patient  Gender: M Patient Age:   14 years Exam Location:  [facility] Procedure:      VAS [place] ABI WITH/WO TBI Referring Phys: GREGORY SCHNIER --------------------------------------------------------------------------------  Indications: Peripheral artery disease, and worsening right calf pain. Other Factors: S/P Interventions.  Vascular Interventions: 01/15/2021 PTA and stent of Rt SFA and popliteal artery. Comparison Study: 12/26/2020 Performing Technologist: Charlane Ferretti RT (R)(VS)  Examination Guidelines: A complete evaluation includes at minimum, Doppler waveform signals and systolic blood pressure reading at the level of bilateral brachial, anterior tibial, and posterior tibial arteries, when vessel segments are accessible. Bilateral testing is considered an integral part of a complete examination. Photoelectric Plethysmograph (PPG) waveforms and toe systolic pressure readings are included as required and additional duplex testing as needed. Limited examinations for reoccurring indications may be performed as noted.  ABI Findings: +---------+------------------+-----+---------+--------+ Right    Rt Pressure (mmHg)IndexWaveform Comment  +---------+------------------+-----+---------+--------+ Brachial 124                                       +---------+------------------+-----+---------+--------+ ATA      119                    biphasic          +---------+------------------+-----+---------+--------+ PTA      137               1.10 triphasic         +---------+------------------+-----+---------+--------+ Great Toe108               0.87 Normal            +---------+------------------+-----+---------+--------+ +---------+------------------+-----+---------+-------+ Left     Lt Pressure (mmHg)IndexWaveform Comment +---------+------------------+-----+---------+-------+ Brachial 124                                     +---------+------------------+-----+---------+-------+ ATA      116                    biphasic         +---------+------------------+-----+---------+-------+ PTA      11                0.09 triphasic        +---------+------------------+-----+---------+-------+ Great Toe94                0.76 Normal           +---------+------------------+-----+---------+-------+ +-------+-----------+-----------+------------+------------+ ABI/TBIToday's ABIToday's TBIPrevious ABIPrevious TBI +-------+-----------+-----------+------------+------------+ Right  1.10       .87        .51         .40          +-------+-----------+-----------+------------+------------+ Left   .98        .76        .96         .86          +-------+-----------+-----------+------------+------------+ Right ABIs appear increased compared to prior study on 12/26/2020. Left ABIs appear essentially unchanged compared to prior study on 12/26/2020.  Summary: Right: Resting right ankle-brachial index is within normal range. No evidence of significant right lower extremity arterial disease. The right toe-brachial index is normal. Right TBI appears increased as compared to the previous exam on 12/26/2020. Left: Resting left ankle-brachial index is within normal range. No evidence of significant left lower  extremity  arterial disease. The left toe-brachial index is normal. Left TBI appears essentially unchanged as compared to the previous exam on 12/26/2020.  *See table(s) above for measurements and observations.  Electronically signed by Hortencia Pilar MD on 01/31/2021 at 4:23:18 PM.    Final    [place] LT UPPER EXTREM LTD SOFT TISSUE NON VASCULAR  Result Date: 02/22/2021 CLINICAL DATA:  Cord like left arm mass. EXAM: ULTRASOUND LEFT UPPER EXTREMITY LIMITED TECHNIQUE: Ultrasound examination of the upper extremity soft tissues was performed in the area of clinical concern. COMPARISON:  None FINDINGS: Examination of the superficial soft tissues of the left upper extremity in the region of clinical concern demonstrate an occluded superficial venous branch of the basilic vein at the level of the mid forearm. IMPRESSION: Evidence of superficial thrombophlebitis of a superficial basilic vein branch in the region palpable abnormality of the left mid forearm. This examination was only limited to this specific abnormality and was not performed as a complete left upper extremity venous duplex study. Left upper extremity deep vein thrombosis cannot be excluded based on this study. Electronically Signed   By: Aletta Edouard M.D.   On: 02/22/2021 17:17     ASSESSMENT & PLAN:   Cancer of lower lobe of left lung ([facility]) #Lung cancer-non-small cell stage IV [right suprarenal nodule-s/p biopsy "non-small cell ca"].  Currently on CarboTaxol plus Keytruda. CT AUG 5th, 2022-partial response [ subpleural mass of the superior segment left lower lobe is decreased in size. An adjacent nodule of the left lower lobe abutting the fissure is almost completely resolved;  Interval decrease in size of enlarged left hilar lymph node; Interval decrease in size of a metastatic soft tissue nodule adjacent to the superior pole of the right kidney]  # currently s/p  cycle #4-CarboTaxol Keytruda today-hold chemotherapy today given the significant ongoing  fatigue.  See below.  Discussed that we will plan to start Keytruda single agent-in approximately 3 weeks.  # Fatigue: September MRI brain negative for any pituitary hypophysitis/brain metastasis.  Recommend continue prednisone 20 mg a day for the next 3 days followed by; prednisone 10 mgx1w; 5 mg 1 week.  New prescription sent.  #Gait instability-recommend evaluation with Gwenette Greet next week.  # CAD-s/p CABG- STABLE; no decompensation   # PVD-s/p stent [Dr.Schneir]-improvement in pain noted.  On Plavix STABLE.   # Skin rash- back/torso- G-1-2-itching- not improved on hydrocortisone; add kenalog. Benadryl 25 mg PO qhs.   # LEFT UE- Soft tissue mass-September 2022 ultrasound suggestive of superficial thrombophlebitis.  Improving.  Continue supportive care.  # DISPOSITION: #  Follow up in 3week- MD; labs- cbc/cmp; Keytruda--Dr.B     All  questions were answered. The patient knows to call the clinic with any problems, questions or concerns.    Cammie Sickle, MD 02/22/2021 11:47 PM

## 2021-02-22 NOTE — Assessment & Plan Note (Addendum)
#  Lung cancer-non-small cell stage IV [right suprarenal nodule-s/p biopsy "non-small cell ca"].  Currently on CarboTaxol plus Keytruda. CT AUG 5th, 2022-partial response [ subpleural mass of the superior segment left lower lobe is decreased in size. An adjacent nodule of the left lower lobe abutting the fissure is almost completely resolved;  Interval decrease in size of enlarged left hilar lymph node; Interval decrease in size of a metastatic soft tissue nodule adjacent to the superior pole of the right kidney]  # currently s/p  cycle #4-CarboTaxol Keytruda today-hold chemotherapy today given the significant ongoing fatigue.  See below.  Discussed that we will plan to start Keytruda single agent-in approximately 3 weeks.  # Fatigue: September MRI brain negative for any pituitary hypophysitis/brain metastasis.  Recommend continue prednisone 20 mg a day for the next 3 days followed by; prednisone 10 mgx1w; 5 mg 1 week.  New prescription sent.  #Gait instability-recommend evaluation with Gwenette Greet next week.  # CAD-s/p CABG- STABLE; no decompensation   # PVD-s/p stent [Dr.Schneir]-improvement in pain noted.  On Plavix STABLE.   # Skin rash- back/torso- G-1-2-itching- not improved on hydrocortisone; add kenalog. Benadryl 25 mg PO qhs.   # LEFT UE- Soft tissue mass-September 2022 ultrasound suggestive of superficial thrombophlebitis.  Improving.  Continue supportive care.  # DISPOSITION: #  Follow up in 3week- MD; labs- cbc/cmp; Keytruda--Dr.B

## 2021-02-25 ENCOUNTER — Telehealth: Payer: Self-pay | Admitting: Hospice and Palliative Medicine

## 2021-02-25 NOTE — Telephone Encounter (Signed)
I spoke with patient regarding the results of his upper extremity ultrasound.  He reports that the mass/thrombophlebitis is improving.  Continue supportive care.  Patient will call with any changes or concerns.

## 2021-02-27 ENCOUNTER — Inpatient Hospital Stay: Payer: PPO | Admitting: Occupational Therapy

## 2021-02-27 DIAGNOSIS — R5383 Other fatigue: Secondary | ICD-10-CM

## 2021-02-27 NOTE — Therapy (Signed)
[facility] [address] [place], [place], [place], [address] Phone: [phone]   Fax:  [phone]  Occupational Therapy Screen:  Patient Details  Name: Randall Ali. MRN: [id number] Date of Birth: 1941-05-21 No data recorded  Encounter Date: 02/27/2021   OT End of Session - 02/27/21 1209     Visit Number 0             Past Medical History:  Diagnosis Date   Allergy    seasonal   Arthritis    all over- in general    CAD (coronary artery disease)    a. inferior wall MI 10/01 s/p PCI/DES to RCA; b. Myoview 3/16 neg for ischemia; c. LHC 8/16: ostLAD 80%, OM1 70%, OM2 70% x 2 lesions, mRCA 30%, dRCA 70% s/p 4-V CABG 01/24/15 (LIMA-LAD, VG- OM1, VG-OM2, VG-PDA)    Cancer (HCC)    skin, melanoma   Carotid artery disease ([place])    a. [place] 8/16: 1-39% bilateral ICA stenosis   Cataract    removed   Diastolic dysfunction    a. TTE 8/16: EF 55-60%, no RWMA, Gr1DD, calcified mitral annulus, mild biatrial enlargement   Erectile dysfunction    GERD (gastroesophageal reflux disease)    History of elbow surgery    History of hiatal hernia    HLD (hyperlipidemia)    HTN (hypertension)    Inferior myocardial infarction ([place]) 03/2000   stent RCA   Lung cancer ([place])    Postoperative wound infection 02/02/2015   Reflux esophagitis    Sleep apnea 2017   CPAP at night    Past Surgical History:  Procedure Laterality Date   arm surgery  2010   BROW LIFT Bilateral 11/25/2019   Procedure: BROW PTOSIS REPAIR BILATERAL;  Surgeon: Karle Starch, MD;  Location: [place];  Service: Ophthalmology;  Laterality: Bilateral;  sleep apnea   CARDIAC CATHETERIZATION  06/24/2011   CARDIAC CATHETERIZATION N/A 01/18/2015   Procedure: Left Heart Cath with coronary angiography;  Surgeon: Minna Merritts, MD;  Location: [place] [facility];  Service: Cardiovascular;  Laterality: N/A;   CARDIAC CATHETERIZATION N/A 01/18/2015    Procedure: Intravascular Pressure Wire/FFR Study;  Surgeon: [name], MD;  Location: [place] [facility];  Service: Cardiovascular;  Laterality: N/A;   CAROTID STENT  03/10/2011   COLONOSCOPY  2010   COLONOSCOPY  06/14/2014   Dr Hilarie Fredrickson   CORONARY ARTERY BYPASS GRAFT N/A 01/24/2015   Procedure: CORONARY ARTERY BYPASS GRAFTING x 4 (LIMA-LAD, SVG-Int 1- Int 2, SVG-PD) ENDOSCOPIC GREATER SAPHENOUS VEIN HARVEST LEFT LEG;  Surgeon: [name], MD;  Location: [place];  Service: Open Heart Surgery;  Laterality: N/A;   EMBOLECTOMY  06/15/2019   Procedure: EMBOLECTOMY;  Surgeon: Katha Cabal, MD;  Location: [facility];  Service: Vascular;;  right superficial femoral artery   ENDARTERECTOMY FEMORAL Right 06/15/2019   Procedure: ENDARTERECTOMY FEMORAL;  Surgeon: Katha Cabal, MD;  Location: [facility];  Service: Vascular;  Laterality: Right;  common femoral profunda femoris superficial femoral   ESOPHAGOGASTRODUODENOSCOPY (EGD) WITH PROPOFOL N/A 04/24/2016   Procedure: ESOPHAGOGASTRODUODENOSCOPY (EGD) WITH PROPOFOL;  Surgeon: Jerene Bears, MD;  Location: [facility];  Service: Gastroenterology;  Laterality: N/A;   EYE SURGERY     lasik 15 yrs. ago, cataracts removed - both eyes    HAMMER TOE SURGERY     right toe   INSERTION OF ILIAC STENT Right 06/15/2019   Procedure: INSERTION  OF ILIAC STENT ( STENTING OF SFA/POP ARTERY );  Surgeon: Katha Cabal, MD;  Location: [facility];  Service: Vascular;  Laterality: Right;  angioplpasty and stent placement: right superficial femoral right tibiopopliteal trunk bilateral common iliac arteries   IR IMAGING GUIDED PORT INSERTION  11/09/2020   LEFT HEART CATH AND CORONARY ANGIOGRAPHY Left 06/10/2017   Procedure: LEFT HEART CATH AND CORONARY ANGIOGRAPHY;  Surgeon: Minna Merritts, MD;  Location: [place] [facility];  Service: Cardiovascular;  Laterality: Left;   LOWER EXTREMITY ANGIOGRAPHY Left 01/04/2019   Procedure: LOWER EXTREMITY  ANGIOGRAPHY;  Surgeon: Katha Cabal, MD;  Location: [place] [facility];  Service: Cardiovascular;  Laterality: Left;   LOWER EXTREMITY ANGIOGRAPHY Right 01/25/2019   Procedure: LOWER EXTREMITY ANGIOGRAPHY;  Surgeon: Katha Cabal, MD;  Location: [place] [facility];  Service: Cardiovascular;  Laterality: Right;   LOWER EXTREMITY ANGIOGRAPHY Right 01/15/2021   LOWER EXTREMITY ANGIOGRAPHY and stent placement to R SFA and popliteal artery Delana Meyer, Dolores Lory, MD)   NASAL SINUS SURGERY  2008   septpolasty, bilateral turbinate reduction   SHOULDER ARTHROSCOPY  2012   TEE WITHOUT CARDIOVERSION N/A 01/24/2015   Procedure: TRANSESOPHAGEAL ECHOCARDIOGRAM (TEE);  Surgeon: [name], MD;  Location: [facility];  Service: Open Heart Surgery;  Laterality: N/A;   TOE SURGERY  1994   UPPER GI ENDOSCOPY  07/2014, 04-24-16   Dr Raquel James   WRIST SURGERY  2011    There were no vitals filed for this visit.   Subjective Assessment - 02/27/21 1207     Subjective  I did not realize my balance is off little - and I get so tired - give out since this chemo - I am going to the gym in the morning -started this past week some of the leg execises - I cant walk to far because my my one groin where stent was put in and my one foot - had surgery in the past    Currently in Pain? Yes    Pain Score 5     Pain Location --   back and foot   Pain Orientation Right    Pain Descriptors / Indicators Aching    Pain Type Chronic pain                   DR  Rogue Bussing NOTE 02/22/21:    ASSESSMENT & PLAN:    Cancer of lower lobe of left lung (HCC) #Lung cancer-non-small cell stage IV [right suprarenal nodule-s/p biopsy "non-small cell ca"].  Currently on CarboTaxol plus Keytruda. CT AUG 5th, 2022-partial response [ subpleural mass of the superior segment left lower lobe is decreased in size. An adjacent nodule of the left lower lobe abutting the fissure is almost completely resolved;  Interval decrease  in size of enlarged left hilar lymph node; Interval decrease in size of a metastatic soft tissue nodule adjacent to the superior pole of the right kidney]   # currently s/p  cycle #4-CarboTaxol Keytruda today-hold chemotherapy today given the significant ongoing fatigue.  See below.  Discussed that we will plan to start Keytruda single agent-in approximately 3 weeks.   # Fatigue: September MRI brain negative for any pituitary hypophysitis/brain metastasis.  Recommend continue prednisone 20 mg a day for the next 3 days followed by; prednisone 10 mgx1w; 5 mg 1 week.  New prescription sent.   #Gait instability-recommend evaluation with Gwenette Greet next week.   # CAD-s/p CABG- STABLE; no decompensation    #  PVD-s/p stent [Dr.Schneir]-improvement in pain noted.  On Plavix STABLE.    # Skin rash- back/torso- G-1-2-itching- not improved on hydrocortisone; add kenalog. Benadryl 25 mg PO qhs.    # LEFT UE- Soft tissue mass-September 2022 ultrasound suggestive of superficial thrombophlebitis.  Improving.  Continue supportive care.   # DISPOSITION: #  Follow up in 3week- MD; labs- cbc/cmp; Keytruda--Dr.B   OT SCREEN 02/27/21: Done the gym 4-5 x wk even thru chemo - but this chemo made me tired, my legs give out - cannot walk far - and have some back pain and groin pain. Started last week some leg work in the gym  Do have pool - but to cold now- should have seen you during treatment I have bike that I can start at home  BERG balance test 52/56 - low risk for falling   O2 decrease to 92% and HR increase to 87 - but with sitting recover to 95% and HR to 81  Pt to cont at gym -but do -stationary bike or nu-step to take pressure of hip and foot -Work on sit<> stand - can later do band around thighs -Sidestepping - and lateral lunges with band or weight - but should not have back pain -Bridge - pain free for back - slowly up and down lowering - 10reps  -ankle AROM in all planes sitting -heel raises - if  pain free  Gradually increase time in gym , cardio, weights, resistance - reps and sets  Pt starting soft ball playing tomorrow - has tournament in Oct per pt  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 No further need for OT - pt to call if need follow up                                Patient will benefit from skilled therapeutic intervention in order to improve the following deficits and impairments:           Visit Diagnosis: Other fatigue    Problem List Patient Active Problem List   Diagnosis Date Noted   Renal mass, right 12/07/2020   Cancer of lower lobe of left lung ([place]) 11/02/2020   Mass of lower lobe of left lung 10/08/2020   Abnormal CT of the chest 10/02/2020   Atherosclerosis of artery of extremity with rest pain ([place]) 06/15/2019   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[place]) 06/15/2019   Carotid stenosis, asymptomatic, bilateral 11/25/2018   Insomnia 11/10/2018   Lumbar spondylosis 11/05/2018   Osseous and subluxation stenosis of intervertebral foramina of lumbar region 11/05/2018   Vascular claudication ([place]) 11/05/2018   Vertigo 10/08/2018   Chronic headache 08/23/2018   Chronic back pain 09/09/2017   Subclavian artery stenosis, right ([place]) 09/09/2017   BPH (benign prostatic hyperplasia) 08/24/2017   Health maintenance examination 07/28/2016   Advanced care planning/counseling discussion 07/28/2016   Hearing loss 07/28/2016   History of esophagitis    Postoperative atrial fibrillation ([place]) 03/27/2015   Infection due to Enterobacter aerogenes 02/02/2015   Status post right foot surgery 02/02/2015   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   History of coronary artery stent placement    OSA (obstructive sleep apnea) 01/13/2015   Status post hardware removal 01/01/2015   Hepatic steatosis 11/24/2014   Atherosclerosis of native arteries of extremity with intermittent claudication ([place]) 04/11/2014   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   Chronic fatigue  and malaise 01/11/2014   Hyperlipidemia 11/09/2013  Hypertension 11/09/2013   Diastasis recti 05/19/2013   H/O arthroplasty 05/19/2013   Degenerative arthritis of left elbow 10/07/2012   Trigger thumb of right hand 07/01/2012   Medicare annual wellness visit, subsequent 02/27/2012   Erectile dysfunction 02/27/2012   Pulmonary nodule 12/24/2011   CAD, NATIVE VESSEL 03/08/2009    Rosalyn Gess, OTR/L,CLT 02/27/2021, 12:09 PM  [facility] [address] [place], [place], [address] Phone: [phone]   Fax:  [phone]  Name: Randall Ali. MRN: [id number] Date of Birth: Jul 08, 1940

## 2021-03-01 DIAGNOSIS — M62830 Muscle spasm of back: Secondary | ICD-10-CM | POA: Diagnosis not present

## 2021-03-01 DIAGNOSIS — M955 Acquired deformity of pelvis: Secondary | ICD-10-CM | POA: Diagnosis not present

## 2021-03-01 DIAGNOSIS — M9905 Segmental and somatic dysfunction of pelvic region: Secondary | ICD-10-CM | POA: Diagnosis not present

## 2021-03-01 DIAGNOSIS — M9903 Segmental and somatic dysfunction of lumbar region: Secondary | ICD-10-CM | POA: Diagnosis not present

## 2021-03-11 ENCOUNTER — Other Ambulatory Visit: Payer: Self-pay | Admitting: Cardiovascular Disease

## 2021-03-14 ENCOUNTER — Inpatient Hospital Stay: Payer: PPO

## 2021-03-14 ENCOUNTER — Encounter: Payer: Self-pay | Admitting: Internal Medicine

## 2021-03-14 ENCOUNTER — Inpatient Hospital Stay: Payer: PPO | Admitting: Internal Medicine

## 2021-03-14 ENCOUNTER — Inpatient Hospital Stay: Payer: PPO | Attending: Internal Medicine

## 2021-03-14 DIAGNOSIS — Z79899 Other long term (current) drug therapy: Secondary | ICD-10-CM | POA: Insufficient documentation

## 2021-03-14 DIAGNOSIS — C3432 Malignant neoplasm of lower lobe, left bronchus or lung: Secondary | ICD-10-CM

## 2021-03-14 DIAGNOSIS — R21 Rash and other nonspecific skin eruption: Secondary | ICD-10-CM | POA: Insufficient documentation

## 2021-03-14 DIAGNOSIS — Z87891 Personal history of nicotine dependence: Secondary | ICD-10-CM | POA: Insufficient documentation

## 2021-03-14 DIAGNOSIS — Z7982 Long term (current) use of aspirin: Secondary | ICD-10-CM | POA: Insufficient documentation

## 2021-03-14 DIAGNOSIS — I10 Essential (primary) hypertension: Secondary | ICD-10-CM | POA: Diagnosis not present

## 2021-03-14 DIAGNOSIS — Z5112 Encounter for antineoplastic immunotherapy: Secondary | ICD-10-CM | POA: Insufficient documentation

## 2021-03-14 DIAGNOSIS — C3412 Malignant neoplasm of upper lobe, left bronchus or lung: Secondary | ICD-10-CM | POA: Insufficient documentation

## 2021-03-14 DIAGNOSIS — R5383 Other fatigue: Secondary | ICD-10-CM | POA: Diagnosis not present

## 2021-03-14 LAB — CBC WITH DIFFERENTIAL/PLATELET
Abs Immature Granulocytes: 0.03 10*3/uL (ref 0.00–0.07)
Basophils Absolute: 0 10*3/uL (ref 0.0–0.1)
Basophils Relative: 1 %
Eosinophils Absolute: 0.2 10*3/uL (ref 0.0–0.5)
Eosinophils Relative: 3 %
HCT: 39 % (ref 39.0–52.0)
Hemoglobin: 13 g/dL (ref 13.0–17.0)
Immature Granulocytes: 1 %
Lymphocytes Relative: 19 %
Lymphs Abs: 1.2 10*3/uL (ref 0.7–4.0)
MCH: 31.8 pg (ref 26.0–34.0)
MCHC: 33.3 g/dL (ref 30.0–36.0)
MCV: 95.4 fL (ref 80.0–100.0)
Monocytes Absolute: 0.7 10*3/uL (ref 0.1–1.0)
Monocytes Relative: 10 %
Neutro Abs: 4.4 10*3/uL (ref 1.7–7.7)
Neutrophils Relative %: 66 %
Platelets: 138 10*3/uL — ABNORMAL LOW (ref 150–400)
RBC: 4.09 MIL/uL — ABNORMAL LOW (ref 4.22–5.81)
RDW: 12.9 % (ref 11.5–15.5)
WBC: 6.6 10*3/uL (ref 4.0–10.5)
nRBC: 0 % (ref 0.0–0.2)

## 2021-03-14 LAB — COMPREHENSIVE METABOLIC PANEL
ALT: 16 U/L (ref 0–44)
AST: 20 U/L (ref 15–41)
Albumin: 4 g/dL (ref 3.5–5.0)
Alkaline Phosphatase: 51 U/L (ref 38–126)
Anion gap: 5 (ref 5–15)
BUN: 19 mg/dL (ref 8–23)
CO2: 25 mmol/L (ref 22–32)
Calcium: 8.9 mg/dL (ref 8.9–10.3)
Chloride: 106 mmol/L (ref 98–111)
Creatinine, Ser: 0.97 mg/dL (ref 0.61–1.24)
GFR, Estimated: >60 mL/min (ref >60)
Glucose, Bld: 102 mg/dL — ABNORMAL HIGH (ref 70–99)
Potassium: 3.9 mmol/L (ref 3.5–5.1)
Sodium: 136 mmol/L (ref 135–145)
Total Bilirubin: 0.6 mg/dL (ref 0.3–1.2)
Total Protein: 6.6 g/dL (ref 6.5–8.1)

## 2021-03-14 MED ORDER — SODIUM CHLORIDE 0.9 % IV SOLN
Freq: Once | INTRAVENOUS | Status: AC
  Filled 2021-03-14: qty 250

## 2021-03-14 MED ORDER — HEPARIN SOD (PORK) LOCK FLUSH 100 UNIT/ML IV SOLN
INTRAVENOUS | Status: AC
  Filled 2021-03-14: qty 5

## 2021-03-14 MED ORDER — HEPARIN SOD (PORK) LOCK FLUSH 100 UNIT/ML IV SOLN
500.0000 [IU] | Freq: Once | INTRAVENOUS | Status: AC | PRN
  Administered 2021-03-14: 500 [IU]
  Filled 2021-03-14: qty 5

## 2021-03-14 MED ORDER — SODIUM CHLORIDE 0.9 % IV SOLN
200.0000 mg | Freq: Once | INTRAVENOUS | Status: AC
  Administered 2021-03-14: 200 mg via INTRAVENOUS
  Filled 2021-03-14: qty 8

## 2021-03-14 NOTE — Progress Notes (Signed)
Patient states his left pinky finger is always numb. Patient is always in pain from lower back.

## 2021-03-14 NOTE — Progress Notes (Signed)
[place] NOTE  Patient Care Team: Ria Bush, MD as PCP - General (Family Medicine) [name] [name], MD as PCP - Cardiology (Cardiology) [name], MD (Internal Medicine) Minna Merritts, MD as Consulting Physician (Cardiology) Pieter Partridge, DO as Consulting Physician (Neurology) Cammie Sickle, MD as Consulting Physician (Hematology and Oncology) Telford Nab, RN as Oncology Nurse Navigator  CHIEF COMPLAINTS/PURPOSE OF CONSULTATION: lung cancer    Oncology History Overview Note  #MAY 2022-Lung cancer-non-small cell [CT guided bx] T3N1 vs stage IV Dr.Hendrickson.  MRI brain negative for malignancy.# 1. April 2022- LLL ~4.0 cm mass in the superior segment left lower lobe abuts the major fissure and the posterior pleural surface without visible chest wall invasion without pleural effusion. There 2-3 other small nodules in the left lower lobe, largest measures 9 mm in diameter. These are suspicious for same lobe satellite lesions and there is left hilar adenopathy. Assuming non-small cell lung cancer the appearance is compatible with T3 N1 M0 disease (stage IIIA).  # right suprarenal nodule-awaiting biopsy on 5/31- non-small cell [revived at tumor conference at [place]  # June 9th 2022- CARBO-TAXOl-KEYTRUDA; Fulphila  MOLECULAR TESTING: NGS TPS-PDL 100%; EXON 12 amplification* mutations.    # CAD [CABG 2016; Dr.Gollan]   Cancer of lower lobe of left lung ([place])  11/02/2020 Initial Diagnosis   Cancer of lower lobe of left lung ([place])   11/02/2020 Cancer Staging   Staging form: Lung, AJCC 8th Edition - Clinical: Stage IVA (cT3, cN1, cM1a) - Signed by Cammie Sickle, MD on 11/02/2020   11/16/2020 -  Chemotherapy   Patient is on Treatment Plan : LUNG NSCLC Carboplatin + Paclitaxel + Pembrolizumab q21d x 4 cycles / Pembrolizumab Maintenance Q21D        HISTORY OF PRESENTING ILLNESS: Alone.  Walking independently. [name] 80 y.o.  male lung cancer-non-small cell stage IV [supra-renal nodule s/p biopsy] currently on CarboTaxol Keytruda status post cycle #4 is here for follow-up.  Patient's maintenance Keytruda was held 3 weeks ago because of poor tolerance extreme fatigue.  Extensive work-up negative for any acute process.  Continues to complain of fatigue.  Currently off prednisone.  However he plans to go to [facility] next week-to play softball with seniors.   Review of Systems  Constitutional:  Positive for malaise/fatigue. Negative for chills, diaphoresis, fever and weight loss.  HENT:  Negative for nosebleeds and sore throat.   Eyes:  Negative for double vision.  Respiratory:  Negative for cough, hemoptysis, sputum production, shortness of breath and wheezing.   Cardiovascular:  Negative for chest pain, palpitations, orthopnea and leg swelling.  Gastrointestinal:  Negative for abdominal pain, blood in stool, constipation, diarrhea, heartburn, melena, nausea and vomiting.  Genitourinary:  Negative for dysuria, frequency and urgency.  Musculoskeletal:  Positive for back pain and joint pain.  Skin: Negative.  Negative for itching and rash.  Neurological:  Negative for dizziness, tingling, focal weakness, weakness and headaches.  Endo/Heme/Allergies:  Does not bruise/bleed easily.  Psychiatric/Behavioral:  Negative for depression. The patient is not nervous/anxious and does not have insomnia.     MEDICAL HISTORY:  Past Medical History:  Diagnosis Date  . Allergy    seasonal  . Arthritis    all over- in general   . CAD (coronary artery disease)    a. inferior wall MI 10/01 s/p PCI/DES to RCA; b. Myoview 3/16 neg for ischemia; c. LHC 8/16: ostLAD 80%, OM1 70%, OM2 70%  x 2 lesions, mRCA 30%, dRCA 70% s/p 4-V CABG 01/24/15 (LIMA-LAD, VG- OM1, VG-OM2, VG-PDA)   . Cancer (HCC)    skin, melanoma  . Carotid artery disease ([place])    a. [place] 8/16: 1-39% bilateral ICA stenosis  . Cataract    removed  .  Diastolic dysfunction    a. TTE 8/16: EF 55-60%, no RWMA, Gr1DD, calcified mitral annulus, mild biatrial enlargement  . Erectile dysfunction   . GERD (gastroesophageal reflux disease)   . History of elbow surgery   . History of hiatal hernia   . HLD (hyperlipidemia)   . HTN (hypertension)   . Inferior myocardial infarction ([place]) 03/2000   stent RCA  . Lung cancer ([place])   . Postoperative wound infection 02/02/2015  . Reflux esophagitis   . Sleep apnea 2017   CPAP at night    SURGICAL HISTORY: Past Surgical History:  Procedure Laterality Date  . arm surgery  2010  . BROW LIFT Bilateral 11/25/2019   Procedure: BROW PTOSIS REPAIR BILATERAL;  Surgeon: Karle Starch, MD;  Location: [facility];  Service: Ophthalmology;  Laterality: Bilateral;  sleep apnea  . CARDIAC CATHETERIZATION  06/24/2011  . CARDIAC CATHETERIZATION N/A 01/18/2015   Procedure: Left Heart Cath with coronary angiography;  Surgeon: Minna Merritts, MD;  Location: [facility];  Service: Cardiovascular;  Laterality: N/A;  . CARDIAC CATHETERIZATION N/A 01/18/2015   Procedure: Intravascular Pressure Wire/FFR Study;  Surgeon: [name], MD;  Location: [facility];  Service: Cardiovascular;  Laterality: N/A;  . CAROTID STENT  03/10/2011  . COLONOSCOPY  2010  . COLONOSCOPY  06/14/2014   Dr Hilarie Fredrickson  . CORONARY ARTERY BYPASS GRAFT N/A 01/24/2015   Procedure: CORONARY ARTERY BYPASS GRAFTING x 4 (LIMA-LAD, SVG-Int 1- Int 2, SVG-PD) ENDOSCOPIC GREATER SAPHENOUS VEIN HARVEST LEFT LEG;  Surgeon: [name], MD;  Location: [facility];  Service: Open Heart Surgery;  Laterality: N/A;  . EMBOLECTOMY  06/15/2019   Procedure: EMBOLECTOMY;  Surgeon: Katha Cabal, MD;  Location: [facility];  Service: Vascular;;  right superficial femoral artery  . ENDARTERECTOMY FEMORAL Right 06/15/2019   Procedure: ENDARTERECTOMY FEMORAL;  Surgeon: Katha Cabal, MD;  Location: [facility];  Service: Vascular;   Laterality: Right;  common femoral profunda femoris superficial femoral  . ESOPHAGOGASTRODUODENOSCOPY (EGD) WITH PROPOFOL N/A 04/24/2016   Procedure: ESOPHAGOGASTRODUODENOSCOPY (EGD) WITH PROPOFOL;  Surgeon: Jerene Bears, MD;  Location: [facility];  Service: Gastroenterology;  Laterality: N/A;  . EYE SURGERY     lasik 15 yrs. ago, cataracts removed - both eyes   . HAMMER TOE SURGERY     right toe  . INSERTION OF ILIAC STENT Right 06/15/2019   Procedure: INSERTION OF ILIAC STENT ( STENTING OF SFA/POP ARTERY );  Surgeon: Katha Cabal, MD;  Location: [facility];  Service: Vascular;  Laterality: Right;  angioplpasty and stent placement: right superficial femoral right tibiopopliteal trunk bilateral common iliac arteries  . IR IMAGING GUIDED PORT INSERTION  11/09/2020  . LEFT HEART CATH AND CORONARY ANGIOGRAPHY Left 06/10/2017   Procedure: LEFT HEART CATH AND CORONARY ANGIOGRAPHY;  Surgeon: Minna Merritts, MD;  Location: [facility];  Service: Cardiovascular;  Laterality: Left;  . LOWER EXTREMITY ANGIOGRAPHY Left 01/04/2019   Procedure: LOWER EXTREMITY ANGIOGRAPHY;  Surgeon: Katha Cabal, MD;  Location: [facility];  Service: Cardiovascular;  Laterality: Left;  . LOWER EXTREMITY ANGIOGRAPHY Right 01/25/2019   Procedure: LOWER EXTREMITY ANGIOGRAPHY;  Surgeon:  Schnier, Dolores Lory, MD;  Location: [facility];  Service: Cardiovascular;  Laterality: Right;  . LOWER EXTREMITY ANGIOGRAPHY Right 01/15/2021   LOWER EXTREMITY ANGIOGRAPHY and stent placement to R SFA and popliteal artery (Schnier, Dolores Lory, MD)  . NASAL SINUS SURGERY  2008   septpolasty, bilateral turbinate reduction  . SHOULDER ARTHROSCOPY  2012  . TEE WITHOUT CARDIOVERSION N/A 01/24/2015   Procedure: TRANSESOPHAGEAL ECHOCARDIOGRAM (TEE);  Surgeon: [name], MD;  Location: [facility];  Service: Open Heart Surgery;  Laterality: N/A;  . [place]  . UPPER GI ENDOSCOPY  07/2014,  04-24-16   Dr Raquel James  . WRIST SURGERY  2011    SOCIAL HISTORY: Social History   Socioeconomic History  . Marital status: Single    Spouse name: Not on file  . Number of children: Not on file  . Years of education: 79  . Highest education level: Not on file  Occupational History  . Occupation: retired    Comment: Agricultural consultant  Tobacco Use  . Smoking status: Former    Packs/day: 2.50    Years: 40.00    Pack years: 100.00    Types: Cigarettes    Quit date: 03/24/2000    Years since quitting: 20.9  . Smokeless tobacco: Never  Vaping Use  . Vaping Use: Never used  Substance and Sexual Activity  . Alcohol use: Yes    Comment: occassionally  . Drug use: No  . Sexual activity: Yes  Other Topics Concern  . Not on file  Social History Narrative   Singled; lives with son and dog    Occ: retired, back part time at [employer];    Activity: gym 4-5d/wk   Diet: good water, fruits/vegetables daily   Caffeine Use-yes      ------------------------------------       Car sales- retd; [employer]- retd; quit smoking 2001. Alcohol couple nights a week. Live in [place]. Daughter lives 10 mins.    Social Determinants of Health   Financial Resource Strain: Low Risk   . Difficulty of Paying Living Expenses: Not hard at all  Food Insecurity: No Food Insecurity  . Worried About Charity fundraiser in the Last Year: Never true  . Ran Out of Food in the Last Year: Never true  Transportation Needs: No Transportation Needs  . Lack of Transportation (Medical): No  . Lack of Transportation (Non-Medical): No  Physical Activity: Sufficiently Active  . Days of Exercise per Week: 7 days  . Minutes of Exercise per Session: 60 min  Stress: No Stress Concern Present  . Feeling of Stress : Not at all  Social Connections: Not on file  Intimate Partner Violence: Not At Risk  . Fear of Current or Ex-Partner: No  . Emotionally Abused: No  . Physically Abused: No  . Sexually Abused: No    FAMILY  HISTORY: Family History  Problem Relation Age of Onset  . Hypertension Mother   . Heart disease Mother   . Hypertension Father   . Diabetes Father   . Lymphoma Sister   . Heart disease Brother 62  . Cancer Paternal Grandfather   . Colon cancer Neg Hx   . Prostate cancer Neg Hx   . Bladder Cancer Neg Hx   . Kidney cancer Neg Hx     ALLERGIES:  is allergic to flomax [tamsulosin].  MEDICATIONS:  Current Outpatient Medications  Medication Sig Dispense Refill  . acetaminophen (TYLENOL) 325 MG tablet Take 650  mg by mouth as needed for moderate pain or mild pain.    . Ascorbic Acid (VITAMIN C) 1000 MG tablet Take 1,000 mg by mouth daily.    [name] aspirin 81 MG EC tablet Take 81 mg by mouth daily.      . Calcium-Magnesium-Vitamin D (CALCIUM 1200+D3 PO) Take 1 tablet by mouth daily.    . Carboxymethylcellul-Glycerin (LUBRICATING EYE DROPS OP) Place 1 drop into both eyes daily as needed (irritation).    . Cholecalciferol (VITAMIN D3) 50 MCG (2000 UT) TABS Take 2,000 Units by mouth daily.    . clopidogrel (PLAVIX) 75 MG tablet Take 1 tablet (75 mg total) by mouth daily. 30 tablet 11  . Cyanocobalamin (B-12) 5000 MCG CAPS Take 5,000 mcg by mouth daily.    [name] docusate sodium (COLACE) 100 MG capsule Take 200 mg by mouth at bedtime.    . lidocaine-prilocaine (EMLA) cream Apply 30 -45 mins prior to port access. 30 g 0  . lisinopril (ZESTRIL) 5 MG tablet TAKE 1 TABLET BY MOUTH DAILY 90 tablet 3  . loratadine (CLARITIN) 10 MG tablet Take 10 mg by mouth daily.    . Melatonin 10 MG CAPS Take 10 mg by mouth at bedtime as needed (sleep).    . metoprolol succinate (TOPROL-XL) 25 MG 24 hr tablet TAKE 1/2 TABLET (12.5 MG) BY MOUTH DAILY 45 tablet 3  . Niacinamide-Zn-Cu-Methfo-Se-Cr (NICOTINAMIDE PO) Take 500 mg by mouth daily.    . nitroGLYCERIN (NITROSTAT) 0.4 MG SL tablet Place 1 tablet (0.4 mg total) under the tongue every 5 (five) minutes as needed for up to 25 doses for chest pain. 25 tablet 1  .  ondansetron (ZOFRAN) 8 MG tablet One pill every 8 hours as needed for nausea/vomitting. 40 tablet 1  . pantoprazole (PROTONIX) 40 MG tablet TAKE 1 TABLET BY MOUTH DAILY (Patient taking differently: Take 40 mg by mouth daily.) 90 tablet 3  . Polyethyl Glycol-Propyl Glycol (LUBRICANT EYE DROPS) 0.4-0.3 % SOLN Place 1-2 drops into both eyes 3 (three) times daily as needed (burning eyes.).    [name] predniSONE (DELTASONE) 20 MG tablet Take 1 tablet (20 mg total) by mouth daily with breakfast. Once a day with food x 10 days. 10 tablet 0  . predniSONE (DELTASONE) 5 MG tablet Take 2 pills/day x 1 week; and then 1 a day. Take in AM with Breakfast. 21 tablet 0  . prochlorperazine (COMPAZINE) 10 MG tablet Take 1 tablet (10 mg total) by mouth every 6 (six) hours as needed for nausea or vomiting. 40 tablet 1  . simvastatin (ZOCOR) 40 MG tablet TAKE 1 TABLET BY MOUTH AT BEDTIME 90 tablet 0  . traMADol (ULTRAM) 50 MG tablet TAKE 1 TABLET BY MOUTH 3 TIMES DAILY AS NEEDED FOR MODERATE PAIN 90 tablet 0  . Vitamin E 450 MG (1000 UT) CAPS Take 450 Units by mouth daily.    . Zinc 50 MG TABS Take 50 mg by mouth daily.     No current facility-administered medications for this visit.   Facility-Administered Medications Ordered in Other Visits  Medication Dose Route Frequency Provider Last Rate Last Admin  . heparin lock flush 100 UNIT/ML injection               .  PHYSICAL EXAMINATION: ECOG PERFORMANCE STATUS: 0 - Asymptomatic  Vitals:   03/14/21 0918  BP: (!) 153/56  Pulse: 76  Resp: 17  Temp: 97.8 F (36.6 C)  SpO2: 96%    Filed Weights  03/14/21 0918  Weight: 217 lb (98.4 kg)      Physical Exam HENT:     Head: Normocephalic and atraumatic.     Mouth/Throat:     Pharynx: No oropharyngeal exudate.  Eyes:     Pupils: Pupils are equal, round, and reactive to light.  Cardiovascular:     Rate and Rhythm: Normal rate and regular rhythm.  Pulmonary:     Effort: No respiratory distress.     Breath  sounds: No wheezing.     Comments: Decreased air entry bilaterally. Abdominal:     General: Bowel sounds are normal. There is no distension.     Palpations: Abdomen is soft. There is no mass.     Tenderness: There is no abdominal tenderness. There is no guarding or rebound.  Musculoskeletal:        General: No tenderness. Normal range of motion.     Cervical back: Normal range of motion and neck supple.  Skin:    General: Skin is warm.  Neurological:     Mental Status: He is alert and oriented to person, place, and time.  Psychiatric:        Mood and Affect: Affect normal.     LABORATORY DATA:  I have reviewed the data as listed Lab Results  Component Value Date   WBC 6.6 03/14/2021   HGB 13.0 03/14/2021   HCT 39.0 03/14/2021   MCV 95.4 03/14/2021   PLT 138 (L) 03/14/2021   Recent Labs    02/14/21 0846 02/22/21 1435 03/14/21 0900  NA 136 135 136  K 4.5 3.9 3.9  CL 105 102 106  CO2 25 23 25   GLUCOSE 111* 132* 102*  BUN 21 24* 19  CREATININE 0.82 1.06 0.97  CALCIUM 9.3 8.9 8.9  GFRNONAA >60 >60 >60  PROT 7.1 6.8 6.6  ALBUMIN 4.2 4.1 4.0  AST 15 15 20   ALT 12 14 16   ALKPHOS 65 56 51  BILITOT 0.6 0.7 0.6    RADIOGRAPHIC STUDIES: I have personally reviewed the radiological images as listed and agreed with the findings in the report. MR BRAIN W WO CONTRAST  Result Date: 02/14/2021 CLINICAL DATA:  Extreme fatigue, weakness, concern for hypophysitis EXAM: MRI HEAD WITHOUT AND WITH CONTRAST TECHNIQUE: Multiplanar, multiecho pulse sequences of the brain and surrounding structures were obtained without and with intravenous contrast. CONTRAST:  10mL GADAVIST GADOBUTROL 1 MMOL/ML IV SOLN COMPARISON:  Brain MRI 10/27/2020 FINDINGS: Brain: There is no evidence of acute intracranial hemorrhage, extra-axial fluid collection, or infarct. Scattered foci of FLAIR signal abnormality throughout the subcortical and periventricular white matter are nonspecific but likely reflect sequela  of chronic white matter microangiopathy. There is no suspicious parenchymal signal abnormality. There is no mass lesion. There is no midline shift. The pituitary and pituitary stalk are normal. Vascular: Normal flow voids. Skull and upper cervical spine: Normal marrow signal. Sinuses/Orbits: The imaged paranasal sinuses are clear. Bilateral lens implants are in place. The globes and orbits are otherwise unremarkable. Other: None. IMPRESSION: No acute intracranial pathology.  No evidence of hypophysitis. Electronically Signed   By: Valetta Mole M.D.   On: 02/14/2021 14:49   [place] LT UPPER EXTREM LTD SOFT TISSUE NON VASCULAR  Result Date: 02/22/2021 CLINICAL DATA:  Cord like left arm mass. EXAM: ULTRASOUND LEFT UPPER EXTREMITY LIMITED TECHNIQUE: Ultrasound examination of the upper extremity soft tissues was performed in the area of clinical concern. COMPARISON:  None FINDINGS: Examination of the superficial soft tissues of the left  upper extremity in the region of clinical concern demonstrate an occluded superficial venous branch of the basilic vein at the level of the mid forearm. IMPRESSION: Evidence of superficial thrombophlebitis of a superficial basilic vein branch in the region palpable abnormality of the left mid forearm. This examination was only limited to this specific abnormality and was not performed as a complete left upper extremity venous duplex study. Left upper extremity deep vein thrombosis cannot be excluded based on this study. Electronically Signed   By: Aletta Edouard M.D.   On: 02/22/2021 17:17     ASSESSMENT & PLAN:   Cancer of lower lobe of left lung (HCC) #Lung cancer-non-small cell stage IV [right suprarenal nodule-s/p biopsy "non-small cell ca"].  On CarboTaxol plus Keytruda. CT AUG 5th, 2022-partial response [ subpleural mass of the superior segment left lower lobe is decreased in size. An adjacent nodule of the left lower lobe abutting the fissure is almost completely resolved;   Interval decrease in size of enlarged left hilar lymph node; Interval decrease in size of a metastatic soft tissue nodule adjacent to the superior pole of the right kidney];  currently s/p  cycle #4-CarboTaxol Keytruda.  #Proceed with maintenance Keytruda today. Labs today reviewed;  acceptable for treatment today.  Again reviewed the patient that treatments are palliative/not curative; and usually indefinite until intolerance or progression of disease.  # Fatigue: No acute process - -September MRI brain negative for any pituitary hypophysitis/brain metastasis.  S/p prednisone-again reviewed with the patient that it is currently a slow process of improvement.  Continue with physical activity. s/p evaluation with Gwenette Greet- STABLE; continue physical activity.  # CAD-s/p CABG- STABLE;  # PVD-s/p stent [Dr.Schneir]-improvement in pain noted.  On Plavix -STABLE.   # Skin rash- back/torso- G-1-2-itching- not improved on hydrocortisone; add kenalog. Benadryl 25 mg PO qhs- STABLE.   # LEFT UE- Soft tissue mass-September 2022 ultrasound suggestive of superficial thrombophlebitis- improving.  # DISPOSITION: # Keytruda today.  # Follow up in 3week- MD; labs- cbc/cmp; Keytruda--Dr.B      All  questions were answered. The patient knows to call the clinic with any problems, questions or concerns.    Cammie Sickle, MD 03/14/2021 9:39 AM

## 2021-03-14 NOTE — Assessment & Plan Note (Signed)
#  Lung cancer-non-small cell stage IV [right suprarenal nodule-s/p biopsy "non-small cell ca"].  On CarboTaxol plus Keytruda. CT AUG 5th, 2022-partial response [ subpleural mass of the superior segment left lower lobe is decreased in size. An adjacent nodule of the left lower lobe abutting the fissure is almost completely resolved;  Interval decrease in size of enlarged left hilar lymph node; Interval decrease in size of a metastatic soft tissue nodule adjacent to the superior pole of the right kidney];  currently s/p  cycle #4-CarboTaxol Keytruda.  #Proceed with maintenance Keytruda today. Labs today reviewed;  acceptable for treatment today.  Again reviewed the patient that treatments are palliative/not curative; and usually indefinite until intolerance or progression of disease.  # Fatigue: No acute process - -September MRI brain negative for any pituitary hypophysitis/brain metastasis.  S/p prednisone-again reviewed with the patient that it is currently a slow process of improvement.  Continue with physical activity. s/p evaluation with Gwenette Greet- STABLE; continue physical activity.  # CAD-s/p CABG- STABLE;  # PVD-s/p stent [Dr.Schneir]-improvement in pain noted.  On Plavix -STABLE.   # Skin rash- back/torso- G-1-2-itching- not improved on hydrocortisone; add kenalog. Benadryl 25 mg PO qhs- STABLE.   # LEFT UE- Soft tissue mass-September 2022 ultrasound suggestive of superficial thrombophlebitis- improving.  # DISPOSITION: # Keytruda today.  # Follow up in 3week- MD; labs- cbc/cmp; Keytruda--Dr.B

## 2021-03-14 NOTE — Patient Instructions (Signed)
[facility]  Discharge Instructions: Thank you for choosing [facility] to provide your oncology and hematology care.  If you have a lab appointment with the [facility], please go directly to the [facility] and check in at the registration area.  Wear comfortable clothing and clothing appropriate for easy access to any Portacath or PICC line.   We strive to give you quality time with your provider. You may need to reschedule your appointment if you arrive late (15 or more minutes).  Arriving late affects you and other patients whose appointments are after yours.  Also, if you miss three or more appointments without notifying the office, you may be dismissed from the clinic at the provider's discretion.      For prescription refill requests, have your pharmacy contact our office and allow 72 hours for refills to be completed.    Today you received the following chemotherapy and/or immunotherapy agents: Keytruda      To help prevent nausea and vomiting after your treatment, we encourage you to take your nausea medication as directed.  BELOW ARE SYMPTOMS THAT SHOULD BE REPORTED IMMEDIATELY: *FEVER GREATER THAN 100.4 F (38 C) OR HIGHER *CHILLS OR SWEATING *NAUSEA AND VOMITING THAT IS NOT CONTROLLED WITH YOUR NAUSEA MEDICATION *UNUSUAL SHORTNESS OF BREATH *UNUSUAL BRUISING OR BLEEDING *URINARY PROBLEMS (pain or burning when urinating, or frequent urination) *BOWEL PROBLEMS (unusual diarrhea, constipation, pain near the anus) TENDERNESS IN MOUTH AND THROAT WITH OR WITHOUT PRESENCE OF ULCERS (sore throat, sores in mouth, or a toothache) UNUSUAL RASH, SWELLING OR PAIN  UNUSUAL VAGINAL DISCHARGE OR ITCHING   Items with * indicate a potential emergency and should be followed up as soon as possible or go to the Emergency Department if any problems should occur.  Please show the CHEMOTHERAPY ALERT CARD or IMMUNOTHERAPY ALERT CARD at check-in  to the Emergency Department and triage nurse.  Should you have questions after your visit or need to cancel or reschedule your appointment, please contact [facility]  [phone] and follow the prompts.  Office hours are 8:00 a.m. to 4:30 p.m. Monday - Friday. Please note that voicemails left after 4:00 p.m. may not be returned until the following business day.  We are closed weekends and major holidays. You have access to a nurse at all times for urgent questions. Please call the main number to the clinic [phone] and follow the prompts.  For any non-urgent questions, you may also contact your provider using MyChart. We now offer e-Visits for anyone 30 and older to request care online for non-urgent symptoms. For details visit [email or web address].   Also download the MyChart app! Go to the app store, search "MyChart", open the app, select [facility], and log in with your MyChart username and password.  Due to Covid, a mask is required upon entering the hospital/clinic. If you do not have a mask, one will be given to you upon arrival. For doctor visits, patients may have 1 support person aged 74 or older with them. For treatment visits, patients cannot have anyone with them due to current Covid guidelines and our immunocompromised population. Pembrolizumab injection What is this medication? PEMBROLIZUMAB (pem broe liz ue mab) is a monoclonal antibody. It is used to treat certain types of cancer. This medicine may be used for other purposes; ask your health care provider or pharmacist if you have questions. COMMON BRAND NAME(S): Keytruda What should I tell my care team before I  take this medication? They need to know if you have any of these conditions: autoimmune diseases like Crohn's disease, ulcerative colitis, or lupus have had or planning to have an allogeneic stem cell transplant (uses someone else's stem cells) history of organ transplant history  of chest radiation nervous system problems like myasthenia gravis or Guillain-Barre syndrome an unusual or allergic reaction to pembrolizumab, other medicines, foods, dyes, or preservatives pregnant or trying to get pregnant breast-feeding How should I use this medication? This medicine is for infusion into a vein. It is given by a health care professional in a hospital or clinic setting. A special MedGuide will be given to you before each treatment. Be sure to read this information carefully each time. Talk to your pediatrician regarding the use of this medicine in children. While this drug may be prescribed for children as young as 6 months for selected conditions, precautions do apply. Overdosage: If you think you have taken too much of this medicine contact a poison control center or emergency room at once. NOTE: This medicine is only for you. Do not share this medicine with others. What if I miss a dose? It is important not to miss your dose. Call your doctor or health care professional if you are unable to keep an appointment. What may interact with this medication? Interactions have not been studied. This list may not describe all possible interactions. Give your health care provider a list of all the medicines, herbs, non-prescription drugs, or dietary supplements you use. Also tell them if you smoke, drink alcohol, or use illegal drugs. Some items may interact with your medicine. What should I watch for while using this medication? Your condition will be monitored carefully while you are receiving this medicine. You may need blood work done while you are taking this medicine. Do not become pregnant while taking this medicine or for 4 months after stopping it. Women should inform their doctor if they wish to become pregnant or think they might be pregnant. There is a potential for serious side effects to an unborn child. Talk to your health care professional or pharmacist for more  information. Do not breast-feed an infant while taking this medicine or for 4 months after the last dose. What side effects may I notice from receiving this medication? Side effects that you should report to your doctor or health care professional as soon as possible: allergic reactions like skin rash, itching or hives, swelling of the face, lips, or tongue bloody or black, tarry breathing problems changes in vision chest pain chills confusion constipation cough diarrhea dizziness or feeling faint or lightheaded fast or irregular heartbeat fever flushing joint pain low blood counts - this medicine may decrease the number of white blood cells, red blood cells and platelets. You may be at increased risk for infections and bleeding. muscle pain muscle weakness pain, tingling, numbness in the hands or feet persistent headache redness, blistering, peeling or loosening of the skin, including inside the mouth signs and symptoms of high blood sugar such as dizziness; dry mouth; dry skin; fruity breath; nausea; stomach pain; increased hunger or thirst; increased urination signs and symptoms of kidney injury like trouble passing urine or change in the amount of urine signs and symptoms of liver injury like dark urine, light-colored stools, loss of appetite, nausea, right upper belly pain, yellowing of the eyes or skin sweating swollen lymph nodes weight loss Side effects that usually do not require medical attention (report to your doctor or health care  professional if they continue or are bothersome): decreased appetite hair loss tiredness This list may not describe all possible side effects. Call your doctor for medical advice about side effects. You may report side effects to FDA at 1-800-FDA-1088. Where should I keep my medication? This drug is given in a hospital or clinic and will not be stored at home. NOTE: This sheet is a summary. It may not cover all possible information. If you  have questions about this medicine, talk to your doctor, pharmacist, or health care provider.  2022 Elsevier/Gold Standard (2019-04-27 21:44:53)

## 2021-03-15 DIAGNOSIS — M955 Acquired deformity of pelvis: Secondary | ICD-10-CM | POA: Diagnosis not present

## 2021-03-15 DIAGNOSIS — M9903 Segmental and somatic dysfunction of lumbar region: Secondary | ICD-10-CM | POA: Diagnosis not present

## 2021-03-15 DIAGNOSIS — H04123 Dry eye syndrome of bilateral lacrimal glands: Secondary | ICD-10-CM | POA: Diagnosis not present

## 2021-03-15 DIAGNOSIS — M62830 Muscle spasm of back: Secondary | ICD-10-CM | POA: Diagnosis not present

## 2021-03-15 DIAGNOSIS — M9905 Segmental and somatic dysfunction of pelvic region: Secondary | ICD-10-CM | POA: Diagnosis not present

## 2021-03-16 DIAGNOSIS — M62830 Muscle spasm of back: Secondary | ICD-10-CM | POA: Diagnosis not present

## 2021-03-16 DIAGNOSIS — M9905 Segmental and somatic dysfunction of pelvic region: Secondary | ICD-10-CM | POA: Diagnosis not present

## 2021-03-16 DIAGNOSIS — M9903 Segmental and somatic dysfunction of lumbar region: Secondary | ICD-10-CM | POA: Diagnosis not present

## 2021-03-16 DIAGNOSIS — M955 Acquired deformity of pelvis: Secondary | ICD-10-CM | POA: Diagnosis not present

## 2021-03-22 ENCOUNTER — Other Ambulatory Visit: Payer: Self-pay

## 2021-03-22 ENCOUNTER — Inpatient Hospital Stay (HOSPITAL_BASED_OUTPATIENT_CLINIC_OR_DEPARTMENT_OTHER): Payer: PPO | Admitting: Hospice and Palliative Medicine

## 2021-03-22 ENCOUNTER — Other Ambulatory Visit: Payer: Self-pay | Admitting: *Deleted

## 2021-03-22 ENCOUNTER — Inpatient Hospital Stay: Payer: PPO

## 2021-03-22 ENCOUNTER — Telehealth: Payer: Self-pay | Admitting: *Deleted

## 2021-03-22 VITALS — BP 147/77 | HR 79 | Temp 98.6°F | Resp 18 | Ht 68.0 in | Wt 217.0 lb

## 2021-03-22 DIAGNOSIS — R21 Rash and other nonspecific skin eruption: Secondary | ICD-10-CM | POA: Diagnosis not present

## 2021-03-22 DIAGNOSIS — C3432 Malignant neoplasm of lower lobe, left bronchus or lung: Secondary | ICD-10-CM | POA: Diagnosis not present

## 2021-03-22 DIAGNOSIS — E860 Dehydration: Secondary | ICD-10-CM

## 2021-03-22 DIAGNOSIS — R5383 Other fatigue: Secondary | ICD-10-CM

## 2021-03-22 DIAGNOSIS — Z5112 Encounter for antineoplastic immunotherapy: Secondary | ICD-10-CM | POA: Diagnosis not present

## 2021-03-22 LAB — CBC WITH DIFFERENTIAL/PLATELET
Abs Immature Granulocytes: 0.02 10*3/uL (ref 0.00–0.07)
Basophils Absolute: 0 10*3/uL (ref 0.0–0.1)
Basophils Relative: 0 %
Eosinophils Absolute: 0.1 10*3/uL (ref 0.0–0.5)
Eosinophils Relative: 2 %
HCT: 39.4 % (ref 39.0–52.0)
Hemoglobin: 13.5 g/dL (ref 13.0–17.0)
Immature Granulocytes: 0 %
Lymphocytes Relative: 28 %
Lymphs Abs: 1.9 10*3/uL (ref 0.7–4.0)
MCH: 32.5 pg (ref 26.0–34.0)
MCHC: 34.3 g/dL (ref 30.0–36.0)
MCV: 94.9 fL (ref 80.0–100.0)
Monocytes Absolute: 0.7 10*3/uL (ref 0.1–1.0)
Monocytes Relative: 10 %
Neutro Abs: 4 10*3/uL (ref 1.7–7.7)
Neutrophils Relative %: 60 %
Platelets: 187 10*3/uL (ref 150–400)
RBC: 4.15 MIL/uL — ABNORMAL LOW (ref 4.22–5.81)
RDW: 12.3 % (ref 11.5–15.5)
WBC: 6.8 10*3/uL (ref 4.0–10.5)
nRBC: 0 % (ref 0.0–0.2)

## 2021-03-22 LAB — BASIC METABOLIC PANEL
Anion gap: 7 (ref 5–15)
BUN: 21 mg/dL (ref 8–23)
CO2: 23 mmol/L (ref 22–32)
Calcium: 8.8 mg/dL — ABNORMAL LOW (ref 8.9–10.3)
Chloride: 105 mmol/L (ref 98–111)
Creatinine, Ser: 0.93 mg/dL (ref 0.61–1.24)
GFR, Estimated: >60 mL/min (ref >60)
Glucose, Bld: 100 mg/dL — ABNORMAL HIGH (ref 70–99)
Potassium: 3.8 mmol/L (ref 3.5–5.1)
Sodium: 135 mmol/L (ref 135–145)

## 2021-03-22 LAB — COMPREHENSIVE METABOLIC PANEL
ALT: 16 U/L (ref 0–44)
AST: 17 U/L (ref 15–41)
Albumin: 4 g/dL (ref 3.5–5.0)
Alkaline Phosphatase: 52 U/L (ref 38–126)
Anion gap: 8 (ref 5–15)
BUN: 21 mg/dL (ref 8–23)
CO2: 23 mmol/L (ref 22–32)
Calcium: 8.8 mg/dL — ABNORMAL LOW (ref 8.9–10.3)
Chloride: 103 mmol/L (ref 98–111)
Creatinine, Ser: 0.94 mg/dL (ref 0.61–1.24)
GFR, Estimated: >60 mL/min (ref >60)
Glucose, Bld: 100 mg/dL — ABNORMAL HIGH (ref 70–99)
Potassium: 3.8 mmol/L (ref 3.5–5.1)
Sodium: 134 mmol/L — ABNORMAL LOW (ref 135–145)
Total Bilirubin: 0.4 mg/dL (ref 0.3–1.2)
Total Protein: 6.6 g/dL (ref 6.5–8.1)

## 2021-03-22 MED ORDER — HEPARIN SOD (PORK) LOCK FLUSH 100 UNIT/ML IV SOLN
INTRAVENOUS | Status: AC
  Administered 2021-03-22: 500 [IU]
  Filled 2021-03-22: qty 5

## 2021-03-22 MED ORDER — SODIUM CHLORIDE 0.9 % IV SOLN
INTRAVENOUS | Status: DC
  Filled 2021-03-22 (×2): qty 250

## 2021-03-22 MED ORDER — MUPIROCIN CALCIUM 2 % EX CREA: 1.0000 "application " | TOPICAL_CREAM | Freq: Two times a day (BID) | CUTANEOUS | 0 refills | Status: DC

## 2021-03-22 MED ORDER — DEXAMETHASONE SODIUM PHOSPHATE 10 MG/ML IJ SOLN
4.0000 mg | Freq: Once | INTRAMUSCULAR | Status: AC
  Administered 2021-03-22: 4 mg via INTRAVENOUS
  Filled 2021-03-22: qty 1

## 2021-03-22 NOTE — Progress Notes (Signed)
They like  [facility]  [phone] [phone]) [phone]  Patient Care Team: Ria Bush, MD as PCP - General (Family Medicine) [name] [name], MD as PCP - Cardiology (Cardiology) [name], MD (Internal Medicine) Minna Merritts, MD as Consulting Physician (Cardiology) Pieter Partridge, DO as Consulting Physician (Neurology) Cammie Sickle, MD as Consulting Physician (Hematology and Oncology) Telford Nab, RN as Oncology Nurse Navigator   Name of the patient: Randall Ali  [id number]  1941-01-26   Date of visit: 03/22/21  Reason for Consult:  Mr. Jarris Kortz is a 80 year old male with multiple medical problems including CAD status post CABG, PVD status post stenting, stage IV non-small cell lung cancer currently initially diagnosed May 2022 on treatment with CarboTaxol plus Keytruda.  Last CT January 11, 2021 revealed partial response to treatment with reduced size left lower lobe mass.  Patient last saw Dr. Rogue Bussing on 03/14/2021 at which he complained of ongoing fatigue but was planning a trip to the beach to play softball.  [name] had been previously held due to poor tolerance.  However, patient did receive Keytruda on 03/14/2021.  Patient [facility] for evaluation of fatigue since his last Keytruda treatment.  He denies fever or chills.  No other constitutional symptoms.  He is still going to the gym but was unable to play softball due to feeling of fatigue.  Patient also endorses a red rash in the back of his neck at the site of his CPAP strap.  He says that he has had a similar rash in the past which was treated with a topical cream.  Denies any neurologic complaints. Denies recent fevers or illnesses. Denies any easy bleeding or bruising. Reports good appetite and denies weight loss. Denies chest pain. Denies any nausea, vomiting, constipation, or diarrhea. Denies urinary complaints.  Patient offers no further specific complaints today.  PAST MEDICAL HISTORY: Past Medical History:  Diagnosis Date   Allergy    seasonal   Arthritis    all over- in general    CAD (coronary artery disease)    a. inferior wall MI 10/01 s/p PCI/DES to RCA; b. Myoview 3/16 neg for ischemia; c. LHC 8/16: ostLAD 80%, OM1 70%, OM2 70% x 2 lesions, mRCA 30%, dRCA 70% s/p 4-V CABG 01/24/15 (LIMA-LAD, VG- OM1, VG-OM2, VG-PDA)    Cancer (HCC)    skin, melanoma   Carotid artery disease ([place])    a. [place] 8/16: 1-39% bilateral ICA stenosis   Cataract    removed   Diastolic dysfunction    a. TTE 8/16: EF 55-60%, no RWMA, Gr1DD, calcified mitral annulus, mild biatrial enlargement   Erectile dysfunction    GERD (gastroesophageal reflux disease)    History of elbow surgery    History of hiatal hernia    HLD (hyperlipidemia)    HTN (hypertension)    Inferior myocardial infarction ([place]) 03/2000   stent RCA   Lung cancer ([place])    Postoperative wound infection 02/02/2015   Reflux esophagitis    Sleep apnea 2017   CPAP at night    PAST SURGICAL HISTORY:  Past Surgical History:  Procedure Laterality Date   arm surgery  2010   BROW LIFT Bilateral 11/25/2019   Procedure: BROW PTOSIS REPAIR BILATERAL;  Surgeon: Karle Starch, MD;  Location: [facility];  Service: Ophthalmology;  Laterality: Bilateral;  sleep apnea   CARDIAC CATHETERIZATION  06/24/2011   CARDIAC CATHETERIZATION N/A 01/18/2015   Procedure:  Left Heart Cath with coronary angiography;  Surgeon: Minna Merritts, MD;  Location: [facility];  Service: Cardiovascular;  Laterality: N/A;   CARDIAC CATHETERIZATION N/A 01/18/2015   Procedure: Intravascular Pressure Wire/FFR Study;  Surgeon: [name], MD;  Location: [facility];  Service: Cardiovascular;  Laterality: N/A;   CAROTID STENT  03/10/2011   COLONOSCOPY  2010   COLONOSCOPY  06/14/2014   Dr Hilarie Fredrickson   CORONARY ARTERY BYPASS GRAFT N/A 01/24/2015    Procedure: CORONARY ARTERY BYPASS GRAFTING x 4 (LIMA-LAD, SVG-Int 1- Int 2, SVG-PD) ENDOSCOPIC GREATER SAPHENOUS VEIN HARVEST LEFT LEG;  Surgeon: [name], MD;  Location: [facility];  Service: Open Heart Surgery;  Laterality: N/A;   EMBOLECTOMY  06/15/2019   Procedure: EMBOLECTOMY;  Surgeon: Katha Cabal, MD;  Location: [facility];  Service: Vascular;;  right superficial femoral artery   ENDARTERECTOMY FEMORAL Right 06/15/2019   Procedure: ENDARTERECTOMY FEMORAL;  Surgeon: Katha Cabal, MD;  Location: [facility];  Service: Vascular;  Laterality: Right;  common femoral profunda femoris superficial femoral   ESOPHAGOGASTRODUODENOSCOPY (EGD) WITH PROPOFOL N/A 04/24/2016   Procedure: ESOPHAGOGASTRODUODENOSCOPY (EGD) WITH PROPOFOL;  Surgeon: Jerene Bears, MD;  Location: [facility];  Service: Gastroenterology;  Laterality: N/A;   EYE SURGERY     lasik 15 yrs. ago, cataracts removed - both eyes    HAMMER TOE SURGERY     right toe   INSERTION OF ILIAC STENT Right 06/15/2019   Procedure: INSERTION OF ILIAC STENT ( STENTING OF SFA/POP ARTERY );  Surgeon: Katha Cabal, MD;  Location: [facility];  Service: Vascular;  Laterality: Right;  angioplpasty and stent placement: right superficial femoral right tibiopopliteal trunk bilateral common iliac arteries   IR IMAGING GUIDED PORT INSERTION  11/09/2020   LEFT HEART CATH AND CORONARY ANGIOGRAPHY Left 06/10/2017   Procedure: LEFT HEART CATH AND CORONARY ANGIOGRAPHY;  Surgeon: Minna Merritts, MD;  Location: [facility];  Service: Cardiovascular;  Laterality: Left;   LOWER EXTREMITY ANGIOGRAPHY Left 01/04/2019   Procedure: LOWER EXTREMITY ANGIOGRAPHY;  Surgeon: Katha Cabal, MD;  Location: [facility];  Service: Cardiovascular;  Laterality: Left;   LOWER EXTREMITY ANGIOGRAPHY Right 01/25/2019   Procedure: LOWER EXTREMITY ANGIOGRAPHY;  Surgeon: Katha Cabal, MD;  Location: [facility];  Service:  Cardiovascular;  Laterality: Right;   LOWER EXTREMITY ANGIOGRAPHY Right 01/15/2021   LOWER EXTREMITY ANGIOGRAPHY and stent placement to R SFA and popliteal artery Delana Meyer, Dolores Lory, MD)   NASAL SINUS SURGERY  2008   septpolasty, bilateral turbinate reduction   SHOULDER ARTHROSCOPY  2012   TEE WITHOUT CARDIOVERSION N/A 01/24/2015   Procedure: TRANSESOPHAGEAL ECHOCARDIOGRAM (TEE);  Surgeon: [name], MD;  Location: [facility];  Service: Open Heart Surgery;  Laterality: N/A;   TOE SURGERY  1994   UPPER GI ENDOSCOPY  07/2014, 04-24-16   Dr Raquel James   WRIST SURGERY  2011    HEMATOLOGY/ONCOLOGY HISTORY:  Oncology History Overview Note  #MAY 2022-Lung cancer-non-small cell [CT guided bx] T3N1 vs stage IV Dr.Hendrickson.  MRI brain negative for malignancy.# 1. April 2022- LLL ~4.0 cm mass in the superior segment left lower lobe abuts the major fissure and the posterior pleural surface without visible chest wall invasion without pleural effusion. There 2-3 other small nodules in the left lower lobe, largest measures 9 mm in diameter. These are suspicious for same lobe satellite lesions and there is left hilar adenopathy. Assuming non-small cell lung cancer  the appearance is compatible with T3 N1 M0 disease (stage IIIA).  # right suprarenal nodule-awaiting biopsy on 5/31- non-small cell [revived at tumor conference at [facility]  # June 9th 2022- CARBO-TAXOl-KEYTRUDA; Fulphila  MOLECULAR TESTING: NGS TPS-PDL 100%; EXON 12 amplification* mutations.    # CAD [CABG 2016; Dr.Gollan]   Cancer of lower lobe of left lung ([place])  11/02/2020 Initial Diagnosis   Cancer of lower lobe of left lung ([place])   11/02/2020 Cancer Staging   Staging form: Lung, AJCC 8th Edition - Clinical: Stage IVA (cT3, cN1, cM1a) - Signed by Cammie Sickle, MD on 11/02/2020   11/16/2020 -  Chemotherapy   Patient is on Treatment Plan : LUNG NSCLC Carboplatin + Paclitaxel + Pembrolizumab q21d x 4 cycles / Pembrolizumab  Maintenance Q21D       ALLERGIES:  is allergic to flomax [tamsulosin].  MEDICATIONS:  Current Outpatient Medications  Medication Sig Dispense Refill   acetaminophen (TYLENOL) 325 MG tablet Take 650 mg by mouth as needed for moderate pain or mild pain.     Ascorbic Acid (VITAMIN C) 1000 MG tablet Take 1,000 mg by mouth daily.     aspirin 81 MG EC tablet Take 81 mg by mouth daily.       Calcium-Magnesium-Vitamin D (CALCIUM 1200+D3 PO) Take 1 tablet by mouth daily.     Carboxymethylcellul-Glycerin (LUBRICATING EYE DROPS OP) Place 1 drop into both eyes daily as needed (irritation).     Cholecalciferol (VITAMIN D3) 50 MCG (2000 UT) TABS Take 2,000 Units by mouth daily.     clopidogrel (PLAVIX) 75 MG tablet Take 1 tablet (75 mg total) by mouth daily. 30 tablet 11   Cyanocobalamin (B-12) 5000 MCG CAPS Take 5,000 mcg by mouth daily.     docusate sodium (COLACE) 100 MG capsule Take 200 mg by mouth at bedtime.     lidocaine-prilocaine (EMLA) cream Apply 30 -45 mins prior to port access. 30 g 0   lisinopril (ZESTRIL) 5 MG tablet TAKE 1 TABLET BY MOUTH DAILY 90 tablet 3   Melatonin 10 MG CAPS Take 10 mg by mouth at bedtime as needed (sleep).     metoprolol succinate (TOPROL-XL) 25 MG 24 hr tablet TAKE 1/2 TABLET (12.5 MG) BY MOUTH DAILY 45 tablet 3   Niacinamide-Zn-Cu-Methfo-Se-Cr (NICOTINAMIDE PO) Take 500 mg by mouth daily.     nitroGLYCERIN (NITROSTAT) 0.4 MG SL tablet Place 1 tablet (0.4 mg total) under the tongue every 5 (five) minutes as needed for up to 25 doses for chest pain. 25 tablet 1   ondansetron (ZOFRAN) 8 MG tablet One pill every 8 hours as needed for nausea/vomitting. 40 tablet 1   pantoprazole (PROTONIX) 40 MG tablet TAKE 1 TABLET BY MOUTH DAILY (Patient taking differently: Take 40 mg by mouth daily.) 90 tablet 3   Polyethyl Glycol-Propyl Glycol (LUBRICANT EYE DROPS) 0.4-0.3 % SOLN Place 1-2 drops into both eyes 3 (three) times daily as needed (burning eyes.).     prochlorperazine  (COMPAZINE) 10 MG tablet Take 1 tablet (10 mg total) by mouth every 6 (six) hours as needed for nausea or vomiting. 40 tablet 1   simvastatin (ZOCOR) 40 MG tablet TAKE 1 TABLET BY MOUTH AT BEDTIME 90 tablet 0   traMADol (ULTRAM) 50 MG tablet TAKE 1 TABLET BY MOUTH 3 TIMES DAILY AS NEEDED FOR MODERATE PAIN 90 tablet 0   Vitamin E 450 MG (1000 UT) CAPS Take 450 Units by mouth daily.     Zinc 50 MG TABS Take  50 mg by mouth daily.     No current facility-administered medications for this visit.   Facility-Administered Medications Ordered in Other Visits  Medication Dose Route Frequency Provider Last Rate Last Admin   0.9 %  sodium chloride infusion   Intravenous Continuous Cammie Sickle, MD 999 mL/hr at 03/22/21 1322 New Bag at 03/22/21 1322   heparin lock flush 100 UNIT/ML injection             VITAL SIGNS: There were no vitals taken for this visit. There were no vitals filed for this visit.  Estimated body mass index is 32.99 kg/m as calculated from the following:   Height as of an earlier encounter on 03/22/21: 5\' 8"  (1.727 m).   Weight as of an earlier encounter on 03/22/21: 217 lb (98.4 kg).  LABS: CBC:    Component Value Date/Time   WBC 6.8 03/22/2021 1302   HGB 13.5 03/22/2021 1302   HGB 14.8 01/15/2015 0956   HCT 39.4 03/22/2021 1302   HCT 44.2 01/15/2015 0956   PLT 187 03/22/2021 1302   PLT 207 01/15/2015 0956   MCV 94.9 03/22/2021 1302   MCV 91 01/15/2015 0956   NEUTROABS 4.0 03/22/2021 1302   NEUTROABS 5.2 01/15/2015 0956   LYMPHSABS 1.9 03/22/2021 1302   LYMPHSABS 1.9 01/15/2015 0956   MONOABS 0.7 03/22/2021 1302   EOSABS 0.1 03/22/2021 1302   EOSABS 0.2 01/15/2015 0956   BASOSABS 0.0 03/22/2021 1302   BASOSABS 0.0 01/15/2015 0956   Comprehensive Metabolic Panel:    Component Value Date/Time   NA 135 03/22/2021 1302   NA 138 01/15/2015 0956   K 3.8 03/22/2021 1302   CL 105 03/22/2021 1302   CO2 23 03/22/2021 1302   BUN 21 03/22/2021 1302   BUN 16  01/15/2015 0956   CREATININE 0.93 03/22/2021 1302   GLUCOSE 100 (H) 03/22/2021 1302   CALCIUM 8.8 (L) 03/22/2021 1302   AST 20 03/14/2021 0900   ALT 16 03/14/2021 0900   ALKPHOS 51 03/14/2021 0900   BILITOT 0.6 03/14/2021 0900   PROT 6.6 03/14/2021 0900   ALBUMIN 4.0 03/14/2021 0900    RADIOGRAPHIC STUDIES: [place] LT UPPER EXTREM LTD SOFT TISSUE NON VASCULAR  Result Date: 02/22/2021 CLINICAL DATA:  Cord like left arm mass. EXAM: ULTRASOUND LEFT UPPER EXTREMITY LIMITED TECHNIQUE: Ultrasound examination of the upper extremity soft tissues was performed in the area of clinical concern. COMPARISON:  None FINDINGS: Examination of the superficial soft tissues of the left upper extremity in the region of clinical concern demonstrate an occluded superficial venous branch of the basilic vein at the level of the mid forearm. IMPRESSION: Evidence of superficial thrombophlebitis of a superficial basilic vein branch in the region palpable abnormality of the left mid forearm. This examination was only limited to this specific abnormality and was not performed as a complete left upper extremity venous duplex study. Left upper extremity deep vein thrombosis cannot be excluded based on this study. Electronically Signed   By: Aletta Edouard M.D.   On: 02/22/2021 17:17    PERFORMANCE STATUS (ECOG) : 1 - Symptomatic but completely ambulatory  Review of Systems Unless otherwise noted, a complete review of systems is negative.  Physical Exam General: NAD Cardiovascular: regular rate and rhythm Pulmonary: clear ant fields Abdomen: soft, nontender, + bowel sounds GU: no suprapubic tenderness Extremities: no edema, no joint deformities skin: Erythematous rash back of neck with several small pustules Neurological: Weakness but otherwise nonfocal     Assessment  and Plan- Patient is a 80 y.o. male with multiple medical problems including stage IV non-small cell lung cancer on Keytruda who presents to clinic for  evaluation of fatigue  Fatigue -likely secondary to [facility].  We will give IV fluids and steroids today.  Patient to speak with Dr. Rogue Bussing regarding future treatment.  Rash-likely coming from CPAP strap.  Suggested that patient contact DME provider to see if they had any different straps for pressure offloading devices.  We will send him an Rx for topical Bactroban.  Case and plan discussed with Dr. Rogue Bussing    Patient expressed understanding and was in agreement with this plan. He also understands that He can call clinic at any time with any questions, concerns, or complaints.   Thank you for allowing me to participate in the care of this very pleasant patient.   Time Total: 15 minutes  Visit consisted of counseling and education dealing with the complex and emotionally intense issues of symptom management and palliative care in the setting of serious and potentially life-threatening illness.Greater than 50%  of this time was spent counseling and coordinating care related to the above assessment and plan.  Signed by: Altha Harm, PhD, NP-C

## 2021-03-22 NOTE — Telephone Encounter (Signed)
Patient called reporting that he is not feeling well since his last treatment on 03/14/21, he has no energy. He does not like feeling this way. He also reports that he thinks he know where the rash on the back of his neck is coming from. He thinks it is his CPAP causing it because he awoke in the middle of the night with it hurting and it was swollen and he removed his CPAP and the rash went away. He does not complain of n/v or decreased appetite nor doe she complain of bowel problems. Just states that he does not feel good. Please advise

## 2021-03-22 NOTE — Telephone Encounter (Signed)
Patient contacted and provided apt today with [facility]- cbc/metc/ thyroid panel with tsh. Possible iv fluids.

## 2021-03-23 LAB — THYROID PANEL WITH TSH
Free Thyroxine Index: 2.1 (ref 1.2–4.9)
T3 Uptake Ratio: 30 % (ref 24–39)
T4, Total: 7.1 ug/dL (ref 4.5–12.0)
TSH: 0.824 u[IU]/mL (ref 0.450–4.500)

## 2021-03-27 ENCOUNTER — Other Ambulatory Visit: Payer: PPO

## 2021-03-28 DIAGNOSIS — G4733 Obstructive sleep apnea (adult) (pediatric): Secondary | ICD-10-CM | POA: Diagnosis not present

## 2021-04-02 DIAGNOSIS — D225 Melanocytic nevi of trunk: Secondary | ICD-10-CM | POA: Diagnosis not present

## 2021-04-02 DIAGNOSIS — C44729 Squamous cell carcinoma of skin of left lower limb, including hip: Secondary | ICD-10-CM | POA: Diagnosis not present

## 2021-04-02 DIAGNOSIS — D2261 Melanocytic nevi of right upper limb, including shoulder: Secondary | ICD-10-CM | POA: Diagnosis not present

## 2021-04-02 DIAGNOSIS — D485 Neoplasm of uncertain behavior of skin: Secondary | ICD-10-CM | POA: Diagnosis not present

## 2021-04-02 DIAGNOSIS — L570 Actinic keratosis: Secondary | ICD-10-CM | POA: Diagnosis not present

## 2021-04-02 DIAGNOSIS — Z85828 Personal history of other malignant neoplasm of skin: Secondary | ICD-10-CM | POA: Diagnosis not present

## 2021-04-02 DIAGNOSIS — D2271 Melanocytic nevi of right lower limb, including hip: Secondary | ICD-10-CM | POA: Diagnosis not present

## 2021-04-02 DIAGNOSIS — Z85820 Personal history of malignant melanoma of skin: Secondary | ICD-10-CM | POA: Diagnosis not present

## 2021-04-02 DIAGNOSIS — C44722 Squamous cell carcinoma of skin of right lower limb, including hip: Secondary | ICD-10-CM | POA: Diagnosis not present

## 2021-04-03 ENCOUNTER — Ambulatory Visit: Payer: PPO | Admitting: Family Medicine

## 2021-04-03 DIAGNOSIS — M955 Acquired deformity of pelvis: Secondary | ICD-10-CM | POA: Diagnosis not present

## 2021-04-03 DIAGNOSIS — M9903 Segmental and somatic dysfunction of lumbar region: Secondary | ICD-10-CM | POA: Diagnosis not present

## 2021-04-03 DIAGNOSIS — M9905 Segmental and somatic dysfunction of pelvic region: Secondary | ICD-10-CM | POA: Diagnosis not present

## 2021-04-03 DIAGNOSIS — M62830 Muscle spasm of back: Secondary | ICD-10-CM | POA: Diagnosis not present

## 2021-04-04 ENCOUNTER — Inpatient Hospital Stay: Payer: PPO

## 2021-04-04 ENCOUNTER — Other Ambulatory Visit: Payer: Self-pay

## 2021-04-04 ENCOUNTER — Inpatient Hospital Stay: Payer: PPO | Admitting: Internal Medicine

## 2021-04-04 ENCOUNTER — Encounter: Payer: Self-pay | Admitting: Internal Medicine

## 2021-04-04 DIAGNOSIS — C3432 Malignant neoplasm of lower lobe, left bronchus or lung: Secondary | ICD-10-CM

## 2021-04-04 DIAGNOSIS — Z95828 Presence of other vascular implants and grafts: Secondary | ICD-10-CM

## 2021-04-04 DIAGNOSIS — Z5112 Encounter for antineoplastic immunotherapy: Secondary | ICD-10-CM | POA: Diagnosis not present

## 2021-04-04 LAB — CBC WITH DIFFERENTIAL/PLATELET
Abs Immature Granulocytes: 0.02 10*3/uL (ref 0.00–0.07)
Basophils Absolute: 0 10*3/uL (ref 0.0–0.1)
Basophils Relative: 1 %
Eosinophils Absolute: 0.2 10*3/uL (ref 0.0–0.5)
Eosinophils Relative: 3 %
HCT: 41.2 % (ref 39.0–52.0)
Hemoglobin: 14.2 g/dL (ref 13.0–17.0)
Immature Granulocytes: 0 %
Lymphocytes Relative: 24 %
Lymphs Abs: 1.4 10*3/uL (ref 0.7–4.0)
MCH: 32.5 pg (ref 26.0–34.0)
MCHC: 34.5 g/dL (ref 30.0–36.0)
MCV: 94.3 fL (ref 80.0–100.0)
Monocytes Absolute: 0.5 10*3/uL (ref 0.1–1.0)
Monocytes Relative: 9 %
Neutro Abs: 3.6 10*3/uL (ref 1.7–7.7)
Neutrophils Relative %: 63 %
Platelets: 170 10*3/uL (ref 150–400)
RBC: 4.37 MIL/uL (ref 4.22–5.81)
RDW: 12.1 % (ref 11.5–15.5)
WBC: 5.8 10*3/uL (ref 4.0–10.5)
nRBC: 0 % (ref 0.0–0.2)

## 2021-04-04 LAB — COMPREHENSIVE METABOLIC PANEL
ALT: 18 U/L (ref 0–44)
AST: 18 U/L (ref 15–41)
Albumin: 4.2 g/dL (ref 3.5–5.0)
Alkaline Phosphatase: 53 U/L (ref 38–126)
Anion gap: 6 (ref 5–15)
BUN: 16 mg/dL (ref 8–23)
CO2: 26 mmol/L (ref 22–32)
Calcium: 9.4 mg/dL (ref 8.9–10.3)
Chloride: 106 mmol/L (ref 98–111)
Creatinine, Ser: 0.96 mg/dL (ref 0.61–1.24)
GFR, Estimated: >60 mL/min (ref >60)
Glucose, Bld: 106 mg/dL — ABNORMAL HIGH (ref 70–99)
Potassium: 4 mmol/L (ref 3.5–5.1)
Sodium: 138 mmol/L (ref 135–145)
Total Bilirubin: 0.7 mg/dL (ref 0.3–1.2)
Total Protein: 6.8 g/dL (ref 6.5–8.1)

## 2021-04-04 MED ORDER — PREDNISONE 20 MG PO TABS: 20.0000 mg | ORAL_TABLET | Freq: Every day | ORAL | 0 refills | Status: DC

## 2021-04-04 MED ORDER — HEPARIN SOD (PORK) LOCK FLUSH 100 UNIT/ML IV SOLN
500.0000 [IU] | Freq: Once | INTRAVENOUS | Status: AC
  Administered 2021-04-04: 500 [IU] via INTRAVENOUS
  Filled 2021-04-04: qty 5

## 2021-04-04 MED ORDER — SODIUM CHLORIDE 0.9% FLUSH
10.0000 mL | Freq: Once | INTRAVENOUS | Status: AC
  Administered 2021-04-04: 10 mL via INTRAVENOUS
  Filled 2021-04-04: qty 10

## 2021-04-04 NOTE — Progress Notes (Signed)
Pt states he is not feeling well this morning not in pain just feeling "out of the loop" and it started this morning.

## 2021-04-04 NOTE — Assessment & Plan Note (Addendum)
#  Lung cancer-non-small cell stage IV [right suprarenal nodule-s/p biopsy "non-small cell ca"].  S/p  CarboTaxol plus Keytruda x4. CT AUG 5th, 2022-partial response [ subpleural mass of the superior segment left lower lobe is decreased in size. An adjacent nodule of the left lower lobe abutting the fissure is almost completely resolved;  Interval decrease in size of enlarged left hilar lymph node; Interval decrease in size of a metastatic soft tissue nodule adjacent to the superior pole of the right kidney];   Currently on  maintenance Keytruda today.   #Continued poor tolerance to [facility] with extreme fatigue.  No obvious organic cause noted [MRI brain/cortisol levels-WNL- ?  Subtle adrenal insufficiency-see below]; hold Keytruda.  Will reimage with CT chest abdomen pelvis; neck.   # Fatigue: No acute process - -September MRI brain negative for any pituitary hypophysitis/brain metastasis.  Patient clinical response to prednisone.  Proceed with prednisone 20 mg a day.  If CT scan shows improved/stable disease consider continue Keytruda; adding hydrocortisone.  # CAD-s/p CABG- STABLE;  # PVD-s/p stent [Dr.Schneir]-improvement in pain noted.  On Plavix -STABLE;  # Skin rash- back/torso- G-1-2-itching-  s/p kenalog. Benadryl 25 mg PO qhs- STABLE;  # IV access: Mediport functioning.  # DISPOSITION: # HOLD Keytruda today; de-access # Follow up in 2 week- MD; labs- cbc/cmp; possible Keytruda; CT scans prior- --Dr.B

## 2021-04-04 NOTE — Progress Notes (Signed)
Randall Ali NOTE  Patient Care Team: Ria Bush, MD as PCP - General (Family Medicine) [name] [name], MD as PCP - Cardiology (Cardiology) [name], MD (Internal Medicine) Minna Merritts, MD as Consulting Physician (Cardiology) Pieter Partridge, DO as Consulting Physician (Neurology) Cammie Sickle, MD as Consulting Physician (Hematology and Oncology) Telford Nab, RN as Oncology Nurse Navigator  CHIEF COMPLAINTS/PURPOSE OF CONSULTATION: lung cancer    Oncology History Overview Note  #MAY 2022-Lung cancer-non-small cell [CT guided bx] T3N1 vs stage IV Dr.Hendrickson.  MRI brain negative for malignancy.# 1. April 2022- LLL ~4.0 cm mass in the superior segment left lower lobe abuts the major fissure and the posterior pleural surface without visible chest wall invasion without pleural effusion. There 2-3 other small nodules in the left lower lobe, largest measures 9 mm in diameter. These are suspicious for same lobe satellite lesions and there is left hilar adenopathy. Assuming non-small cell lung cancer the appearance is compatible with T3 N1 M0 disease (stage IIIA).  # right suprarenal nodule-awaiting biopsy on 5/31- non-small cell [revived at tumor conference at [place]  # June 9th 2022- CARBO-TAXOl-KEYTRUDA; Fulphila  MOLECULAR TESTING: NGS TPS-PDL 100%; EXON 12 amplification* mutations.    # CAD [CABG 2016; Dr.Gollan]   Cancer of lower lobe of left lung ([place])  11/02/2020 Initial Diagnosis   Cancer of lower lobe of left lung ([place])   11/02/2020 Cancer Staging   Staging form: Lung, AJCC 8th Edition - Clinical: Stage IVA (cT3, cN1, cM1a) - Signed by Cammie Sickle, MD on 11/02/2020    11/16/2020 -  Chemotherapy   Patient is on Treatment Plan : LUNG NSCLC Carboplatin + Paclitaxel + Pembrolizumab q21d x 4 cycles / Pembrolizumab Maintenance Q21D        HISTORY OF PRESENTING ILLNESS: Alone.  Walking independently. Randall Ali. 80 y.o.  male lung cancer-non-small cell stage IV [supra-renal nodule s/p biopsy] currently on Keytruda maintenance x1 is here for follow-up.  Patient continues to complain of significant fatigue.  He states that he is unable to get up and move around for almost a week after his treatment.  He states that he has a upcoming wedding next week.  Is concerned about his functional status posttreatment.  No nausea no vomiting.  No headaches.  Review of Systems  Constitutional:  Positive for malaise/fatigue. Negative for chills, diaphoresis, fever and weight loss.  HENT:  Negative for nosebleeds and sore throat.   Eyes:  Negative for double vision.  Respiratory:  Negative for cough, hemoptysis, sputum production, shortness of breath and wheezing.   Cardiovascular:  Negative for chest pain, palpitations, orthopnea and leg swelling.  Gastrointestinal:  Negative for abdominal pain, blood in stool, constipation, diarrhea, heartburn, melena, nausea and vomiting.  Genitourinary:  Negative for dysuria, frequency and urgency.  Musculoskeletal:  Positive for back pain and joint pain.  Skin: Negative.  Negative for itching and rash.  Neurological:  Negative for dizziness, tingling, focal weakness, weakness and headaches.  Endo/Heme/Allergies:  Does not bruise/bleed easily.  Psychiatric/Behavioral:  Negative for depression. The patient is not nervous/anxious and does not have insomnia.     MEDICAL HISTORY:  Past Medical History:  Diagnosis Date   Allergy    seasonal   Arthritis    all over- in general    CAD (coronary artery disease)    a. inferior wall MI 10/01 s/p PCI/DES to RCA; b. Myoview 3/16 neg for ischemia; c. LHC 8/16: ostLAD 80%,  OM1 70%, OM2 70% x 2 lesions, mRCA 30%, dRCA 70% s/p 4-V CABG 01/24/15 (LIMA-LAD, VG- OM1, VG-OM2, VG-PDA)    Cancer (HCC)    skin, melanoma   Carotid artery disease ([place])    a. [place] 8/16: 1-39% bilateral ICA stenosis   Cataract    removed   Diastolic dysfunction     a. TTE 8/16: EF 55-60%, no RWMA, Gr1DD, calcified mitral annulus, mild biatrial enlargement   Erectile dysfunction    GERD (gastroesophageal reflux disease)    History of elbow surgery    History of hiatal hernia    HLD (hyperlipidemia)    HTN (hypertension)    Inferior myocardial infarction ([place]) 03/2000   stent RCA   Lung cancer ([place])    Postoperative wound infection 02/02/2015   Reflux esophagitis    Sleep apnea 2017   CPAP at night    SURGICAL HISTORY: Past Surgical History:  Procedure Laterality Date   arm surgery  2010   BROW LIFT Bilateral 11/25/2019   Procedure: BROW PTOSIS REPAIR BILATERAL;  Surgeon: Karle Starch, MD;  Location: [facility];  Service: Ophthalmology;  Laterality: Bilateral;  sleep apnea   CARDIAC CATHETERIZATION  06/24/2011   CARDIAC CATHETERIZATION N/A 01/18/2015   Procedure: Left Heart Cath with coronary angiography;  Surgeon: Minna Merritts, MD;  Location: [place] [facility];  Service: Cardiovascular;  Laterality: N/A;   CARDIAC CATHETERIZATION N/A 01/18/2015   Procedure: Intravascular Pressure Wire/FFR Study;  Surgeon: [name], MD;  Location: [place] [facility];  Service: Cardiovascular;  Laterality: N/A;   CAROTID STENT  03/10/2011   COLONOSCOPY  2010   COLONOSCOPY  06/14/2014   Dr Hilarie Fredrickson   CORONARY ARTERY BYPASS GRAFT N/A 01/24/2015   Procedure: CORONARY ARTERY BYPASS GRAFTING x 4 (LIMA-LAD, SVG-Int 1- Int 2, SVG-PD) ENDOSCOPIC GREATER SAPHENOUS VEIN HARVEST LEFT LEG;  Surgeon: [name], MD;  Location: [facility];  Service: Open Heart Surgery;  Laterality: N/A;   EMBOLECTOMY  06/15/2019   Procedure: EMBOLECTOMY;  Surgeon: Katha Cabal, MD;  Location: [facility];  Service: Vascular;;  right superficial femoral artery   ENDARTERECTOMY FEMORAL Right 06/15/2019   Procedure: ENDARTERECTOMY FEMORAL;  Surgeon: Katha Cabal, MD;  Location: [facility];  Service: Vascular;  Laterality: Right;  common femoral profunda  femoris superficial femoral   ESOPHAGOGASTRODUODENOSCOPY (EGD) WITH PROPOFOL N/A 04/24/2016   Procedure: ESOPHAGOGASTRODUODENOSCOPY (EGD) WITH PROPOFOL;  Surgeon: Jerene Bears, MD;  Location: [facility];  Service: Gastroenterology;  Laterality: N/A;   EYE SURGERY     lasik 15 yrs. ago, cataracts removed - both eyes    HAMMER TOE SURGERY     right toe   INSERTION OF ILIAC STENT Right 06/15/2019   Procedure: INSERTION OF ILIAC STENT ( STENTING OF SFA/POP ARTERY );  Surgeon: Katha Cabal, MD;  Location: [facility];  Service: Vascular;  Laterality: Right;  angioplpasty and stent placement: right superficial femoral right tibiopopliteal trunk bilateral common iliac arteries   IR IMAGING GUIDED PORT INSERTION  11/09/2020   LEFT HEART CATH AND CORONARY ANGIOGRAPHY Left 06/10/2017   Procedure: LEFT HEART CATH AND CORONARY ANGIOGRAPHY;  Surgeon: Minna Merritts, MD;  Location: [facility];  Service: Cardiovascular;  Laterality: Left;   LOWER EXTREMITY ANGIOGRAPHY Left 01/04/2019   Procedure: LOWER EXTREMITY ANGIOGRAPHY;  Surgeon: Katha Cabal, MD;  Location: [place] [facility];  Service: Cardiovascular;  Laterality: Left;   LOWER EXTREMITY ANGIOGRAPHY Right 01/25/2019   Procedure: LOWER  EXTREMITY ANGIOGRAPHY;  Surgeon: Katha Cabal, MD;  Location: [place] [facility];  Service: Cardiovascular;  Laterality: Right;   LOWER EXTREMITY ANGIOGRAPHY Right 01/15/2021   LOWER EXTREMITY ANGIOGRAPHY and stent placement to R SFA and popliteal artery Delana Meyer, Dolores Lory, MD)   NASAL SINUS SURGERY  2008   septpolasty, bilateral turbinate reduction   SHOULDER ARTHROSCOPY  2012   TEE WITHOUT CARDIOVERSION N/A 01/24/2015   Procedure: TRANSESOPHAGEAL ECHOCARDIOGRAM (TEE);  Surgeon: [name], MD;  Location: [place];  Service: Open Heart Surgery;  Laterality: N/A;   TOE SURGERY  1994   UPPER GI ENDOSCOPY  07/2014, 04-24-16   Dr Raquel James   WRIST SURGERY  2011    SOCIAL  HISTORY: Social History   Socioeconomic History   Marital status: Single    Spouse name: Not on file   Number of children: Not on file   Years of education: 12   Highest education level: Not on file  Occupational History   Occupation: retired    Comment: [employer]  Tobacco Use   Smoking status: Former    Packs/day: 2.50    Years: 40.00    Pack years: 100.00    Types: Cigarettes    Quit date: 03/24/2000    Years since quitting: 21.0   Smokeless tobacco: Never  Vaping Use   Vaping Use: Never used  Substance and Sexual Activity   Alcohol use: Yes    Comment: occassionally   Drug use: No   Sexual activity: Yes  Other Topics Concern   Not on file  Social History Narrative   Singled; lives with son and dog    Occ: retired, back part time at [employer];    Activity: gym 4-5d/wk   Diet: good water, fruits/vegetables daily   Caffeine Use-yes      ------------------------------------       Car sales- retd; [employer]- retd; quit smoking 2001. Alcohol couple nights a week. Live in [place]. Daughter lives 10 mins.    Social Determinants of Health   Financial Resource Strain: Not on file  Food Insecurity: Not on file  Transportation Needs: Not on file  Physical Activity: Not on file  Stress: Not on file  Social Connections: Not on file  Intimate Partner Violence: Not on file    FAMILY HISTORY: Family History  Problem Relation Age of Onset   Hypertension Mother    Heart disease Mother    Hypertension Father    Diabetes Father    Lymphoma Sister    Heart disease Brother 16   Cancer Paternal Grandfather    Colon cancer Neg Hx    Prostate cancer Neg Hx    Bladder Cancer Neg Hx    Kidney cancer Neg Hx     ALLERGIES:  is allergic to flomax [tamsulosin].  MEDICATIONS:  Current Outpatient Medications  Medication Sig Dispense Refill   acetaminophen (TYLENOL) 325 MG tablet Take 650 mg by mouth as needed for moderate pain or mild pain.     Ascorbic Acid (VITAMIN C)  1000 MG tablet Take 1,000 mg by mouth daily.     aspirin 81 MG EC tablet Take 81 mg by mouth daily.       Calcium-Magnesium-Vitamin D (CALCIUM 1200+D3 PO) Take 1 tablet by mouth daily.     Carboxymethylcellul-Glycerin (LUBRICATING EYE DROPS OP) Place 1 drop into both eyes daily as needed (irritation).     Cholecalciferol (VITAMIN D3) 50 MCG (2000 UT) TABS Take 2,000 Units by mouth daily.  clopidogrel (PLAVIX) 75 MG tablet Take 1 tablet (75 mg total) by mouth daily. 30 tablet 11   Cyanocobalamin (B-12) 5000 MCG CAPS Take 5,000 mcg by mouth daily.     docusate sodium (COLACE) 100 MG capsule Take 200 mg by mouth at bedtime.     erythromycin ophthalmic ointment SMARTSIG:In Eye(s)     lidocaine-prilocaine (EMLA) cream Apply 30 -45 mins prior to port access. 30 g 0   lisinopril (ZESTRIL) 5 MG tablet TAKE 1 TABLET BY MOUTH DAILY 90 tablet 3   Melatonin 10 MG CAPS Take 10 mg by mouth at bedtime as needed (sleep).     metoprolol succinate (TOPROL-XL) 25 MG 24 hr tablet TAKE 1/2 TABLET (12.5 MG) BY MOUTH DAILY 45 tablet 3   mupirocin cream (BACTROBAN) 2 % Apply 1 application topically 2 (two) times daily. 15 g 0   mupirocin ointment (BACTROBAN) 2 % Apply 1 application topically 3 (three) times daily.     Niacinamide-Zn-Cu-Methfo-Se-Cr (NICOTINAMIDE PO) Take 500 mg by mouth daily.     pantoprazole (PROTONIX) 40 MG tablet TAKE 1 TABLET BY MOUTH DAILY (Patient taking differently: Take 40 mg by mouth daily.) 90 tablet 3   Polyethyl Glycol-Propyl Glycol (LUBRICANT EYE DROPS) 0.4-0.3 % SOLN Place 1-2 drops into both eyes 3 (three) times daily as needed (burning eyes.).     predniSONE (DELTASONE) 20 MG tablet Take 1 tablet (20 mg total) by mouth daily with breakfast. Once a day with food 20 tablet 0   simvastatin (ZOCOR) 40 MG tablet TAKE 1 TABLET BY MOUTH AT BEDTIME 90 tablet 0   traMADol (ULTRAM) 50 MG tablet TAKE 1 TABLET BY MOUTH 3 TIMES DAILY AS NEEDED FOR MODERATE PAIN 90 tablet 0   triamcinolone  ointment (KENALOG) 0.5 %      Vitamin E 450 MG (1000 UT) CAPS Take 450 Units by mouth daily.     Zinc 50 MG TABS Take 50 mg by mouth daily.     nitroGLYCERIN (NITROSTAT) 0.4 MG SL tablet Place 1 tablet (0.4 mg total) under the tongue every 5 (five) minutes as needed for up to 25 doses for chest pain. (Patient not taking: Reported on 04/04/2021) 25 tablet 1   ondansetron (ZOFRAN) 8 MG tablet One pill every 8 hours as needed for nausea/vomitting. (Patient not taking: Reported on 04/04/2021) 40 tablet 1   prochlorperazine (COMPAZINE) 10 MG tablet Take 1 tablet (10 mg total) by mouth every 6 (six) hours as needed for nausea or vomiting. (Patient not taking: Reported on 04/04/2021) 40 tablet 1   No current facility-administered medications for this visit.   Facility-Administered Medications Ordered in Other Visits  Medication Dose Route Frequency Provider Last Rate Last Admin   heparin lock flush 100 UNIT/ML injection            heparin lock flush 100 unit/mL  500 Units Intravenous Once Cammie Sickle, MD          .  PHYSICAL EXAMINATION: ECOG PERFORMANCE STATUS: 0 - Asymptomatic  Vitals:   04/04/21 0910  BP: (!) 155/73  Pulse: 65  Resp: 18  Temp: (!) 97.3 F (36.3 C)  SpO2: 99%    Filed Weights   04/04/21 0910  Weight: 217 lb 12.8 oz (98.8 kg)      Physical Exam HENT:     Head: Normocephalic and atraumatic.     Mouth/Throat:     Pharynx: No oropharyngeal exudate.  Eyes:     Pupils: Pupils are equal, round, and reactive  to light.  Cardiovascular:     Rate and Rhythm: Normal rate and regular rhythm.  Pulmonary:     Effort: No respiratory distress.     Breath sounds: No wheezing.     Comments: Decreased air entry bilaterally. Abdominal:     General: Bowel sounds are normal. There is no distension.     Palpations: Abdomen is soft. There is no mass.     Tenderness: There is no abdominal tenderness. There is no guarding or rebound.  Musculoskeletal:         General: No tenderness. Normal range of motion.     Cervical back: Normal range of motion and neck supple.  Skin:    General: Skin is warm.  Neurological:     Mental Status: He is alert and oriented to person, place, and time.  Psychiatric:        Mood and Affect: Affect normal.     LABORATORY DATA:  I have reviewed the data as listed Lab Results  Component Value Date   WBC 5.8 04/04/2021   HGB 14.2 04/04/2021   HCT 41.2 04/04/2021   MCV 94.3 04/04/2021   PLT 170 04/04/2021   Recent Labs    03/14/21 0900 03/22/21 1250 03/22/21 1302 04/04/21 0856  NA 136 134* 135 138  K 3.9 3.8 3.8 4.0  CL 106 103 105 106  CO2 25 23 23 26   GLUCOSE 102* 100* 100* 106*  BUN 19 21 21 16   CREATININE 0.97 0.94 0.93 0.96  CALCIUM 8.9 8.8* 8.8* 9.4  GFRNONAA >60 >60 >60 >60  PROT 6.6 6.6  --  6.8  ALBUMIN 4.0 4.0  --  4.2  AST 20 17  --  18  ALT 16 16  --  18  ALKPHOS 51 52  --  53  BILITOT 0.6 0.4  --  0.7    RADIOGRAPHIC STUDIES: I have personally reviewed the radiological images as listed and agreed with the findings in the report. No results found.   ASSESSMENT & PLAN:   Cancer of lower lobe of left lung ([place]) #Lung cancer-non-small cell stage IV [right suprarenal nodule-s/p biopsy "non-small cell ca"].  S/p  CarboTaxol plus Keytruda x4. CT AUG 5th, 2022-partial response [ subpleural mass of the superior segment left lower lobe is decreased in size. An adjacent nodule of the left lower lobe abutting the fissure is almost completely resolved;  Interval decrease in size of enlarged left hilar lymph node; Interval decrease in size of a metastatic soft tissue nodule adjacent to the superior pole of the right kidney];   Currently on  maintenance Keytruda today.   #Continued poor tolerance to [facility] with extreme fatigue.  No obvious organic cause noted [MRI brain/cortisol levels-WNL- ?  Subtle adrenal insufficiency-see below]; hold Keytruda.  Will reimage with CT chest abdomen pelvis;  neck.   # Fatigue: No acute process - -September MRI brain negative for any pituitary hypophysitis/brain metastasis.  Patient clinical response to prednisone.  Proceed with prednisone 20 mg a day.  If CT scan shows improved/stable disease consider continue Keytruda; adding hydrocortisone.  # CAD-s/p CABG- STABLE;  # PVD-s/p stent [Dr.Schneir]-improvement in pain noted.  On Plavix -STABLE;  # Skin rash- back/torso- G-1-2-itching-  s/p kenalog. Benadryl 25 mg PO qhs- STABLE;  # IV access: Mediport functioning.  # DISPOSITION: # HOLD Keytruda today; de-access # Follow up in 2 week- MD; labs- cbc/cmp; possible Keytruda; CT scans prior- --Dr.B      All  questions were answered.  The patient knows to call the clinic with any problems, questions or concerns.    Cammie Sickle, MD 04/04/2021 9:55 AM

## 2021-04-08 ENCOUNTER — Other Ambulatory Visit: Payer: Self-pay | Admitting: Family Medicine

## 2021-04-08 NOTE — Telephone Encounter (Signed)
ERx 

## 2021-04-08 NOTE — Telephone Encounter (Signed)
Name of Medication: Tramadol Name of Pharmacy: [facility] or Written Date and Quantity: 02/06/21, #90 Last Office Visit and Type:  10/02/20, 6 mo f/u Next Office Visit and Type: none Last Controlled Substance Agreement Date: 01/18/16 Last UDS: 01/18/16

## 2021-04-09 ENCOUNTER — Telehealth: Payer: Self-pay | Admitting: *Deleted

## 2021-04-09 ENCOUNTER — Inpatient Hospital Stay: Payer: PPO

## 2021-04-09 ENCOUNTER — Other Ambulatory Visit: Payer: Self-pay

## 2021-04-09 ENCOUNTER — Inpatient Hospital Stay (HOSPITAL_BASED_OUTPATIENT_CLINIC_OR_DEPARTMENT_OTHER): Payer: PPO | Admitting: Oncology

## 2021-04-09 ENCOUNTER — Inpatient Hospital Stay: Payer: PPO | Attending: Internal Medicine

## 2021-04-09 VITALS — BP 114/65 | HR 77 | Temp 98.9°F | Resp 18

## 2021-04-09 VITALS — BP 125/66 | HR 77 | Temp 98.9°F | Resp 18

## 2021-04-09 DIAGNOSIS — C3432 Malignant neoplasm of lower lobe, left bronchus or lung: Secondary | ICD-10-CM

## 2021-04-09 DIAGNOSIS — Z87891 Personal history of nicotine dependence: Secondary | ICD-10-CM | POA: Diagnosis not present

## 2021-04-09 DIAGNOSIS — Z7982 Long term (current) use of aspirin: Secondary | ICD-10-CM | POA: Insufficient documentation

## 2021-04-09 DIAGNOSIS — Z5112 Encounter for antineoplastic immunotherapy: Secondary | ICD-10-CM | POA: Insufficient documentation

## 2021-04-09 DIAGNOSIS — Z79899 Other long term (current) drug therapy: Secondary | ICD-10-CM | POA: Insufficient documentation

## 2021-04-09 DIAGNOSIS — E860 Dehydration: Secondary | ICD-10-CM

## 2021-04-09 DIAGNOSIS — R42 Dizziness and giddiness: Secondary | ICD-10-CM | POA: Insufficient documentation

## 2021-04-09 DIAGNOSIS — Z95828 Presence of other vascular implants and grafts: Secondary | ICD-10-CM

## 2021-04-09 LAB — COMPREHENSIVE METABOLIC PANEL
ALT: 18 U/L (ref 0–44)
AST: 18 U/L (ref 15–41)
Albumin: 4.3 g/dL (ref 3.5–5.0)
Alkaline Phosphatase: 51 U/L (ref 38–126)
Anion gap: 9 (ref 5–15)
BUN: 24 mg/dL — ABNORMAL HIGH (ref 8–23)
CO2: 25 mmol/L (ref 22–32)
Calcium: 9.4 mg/dL (ref 8.9–10.3)
Chloride: 103 mmol/L (ref 98–111)
Creatinine, Ser: 1.12 mg/dL (ref 0.61–1.24)
GFR, Estimated: >60 mL/min (ref >60)
Glucose, Bld: 120 mg/dL — ABNORMAL HIGH (ref 70–99)
Potassium: 3.8 mmol/L (ref 3.5–5.1)
Sodium: 137 mmol/L (ref 135–145)
Total Bilirubin: 0.5 mg/dL (ref 0.3–1.2)
Total Protein: 6.6 g/dL (ref 6.5–8.1)

## 2021-04-09 LAB — CBC WITH DIFFERENTIAL/PLATELET
Abs Immature Granulocytes: 0.05 10*3/uL (ref 0.00–0.07)
Basophils Absolute: 0 10*3/uL (ref 0.0–0.1)
Basophils Relative: 1 %
Eosinophils Absolute: 0.1 10*3/uL (ref 0.0–0.5)
Eosinophils Relative: 1 %
HCT: 41.8 % (ref 39.0–52.0)
Hemoglobin: 14.5 g/dL (ref 13.0–17.0)
Immature Granulocytes: 1 %
Lymphocytes Relative: 15 %
Lymphs Abs: 1.1 10*3/uL (ref 0.7–4.0)
MCH: 32.6 pg (ref 26.0–34.0)
MCHC: 34.7 g/dL (ref 30.0–36.0)
MCV: 93.9 fL (ref 80.0–100.0)
Monocytes Absolute: 0.5 10*3/uL (ref 0.1–1.0)
Monocytes Relative: 6 %
Neutro Abs: 6 10*3/uL (ref 1.7–7.7)
Neutrophils Relative %: 76 %
Platelets: 178 10*3/uL (ref 150–400)
RBC: 4.45 MIL/uL (ref 4.22–5.81)
RDW: 12.1 % (ref 11.5–15.5)
WBC: 7.7 10*3/uL (ref 4.0–10.5)
nRBC: 0 % (ref 0.0–0.2)

## 2021-04-09 MED ORDER — SODIUM CHLORIDE 0.9 % IV SOLN
INTRAVENOUS | Status: DC
  Filled 2021-04-09: qty 250

## 2021-04-09 MED ORDER — SODIUM CHLORIDE 0.9% FLUSH
10.0000 mL | Freq: Once | INTRAVENOUS | Status: DC
  Filled 2021-04-09: qty 10

## 2021-04-09 MED ORDER — HEPARIN SOD (PORK) LOCK FLUSH 100 UNIT/ML IV SOLN
500.0000 [IU] | Freq: Once | INTRAVENOUS | Status: AC
  Administered 2021-04-09: 500 [IU] via INTRAVENOUS
  Filled 2021-04-09: qty 5

## 2021-04-09 NOTE — Telephone Encounter (Signed)
Patient needs evaluation in [facility]- consideration for IV fluids hydration. (Per Dr. Jacinto Reap the [name] was held @ the last apt.)

## 2021-04-09 NOTE — Progress Notes (Signed)
Symptom Management Consult note [place]  [phone] [phone]) [phone]  Patient Care Team: Ria Bush, MD as PCP - General (Family Medicine) [name] [name], MD as PCP - Cardiology (Cardiology) [name], MD (Internal Medicine) Minna Merritts, MD as Consulting Physician (Cardiology) Pieter Partridge, DO as Consulting Physician (Neurology) Cammie Sickle, MD as Consulting Physician (Hematology and Oncology) Telford Nab, RN as Oncology Nurse Navigator   Name of the patient: Randall Ali  [id number]  February 28, 1941   Date of visit: 04/09/2021   Diagnosis-non-small cell lung cancer  Chief complaint/ Reason for visit-dizziness  Heme/Onc history: Mr. Koors is a 80 year old male with past medical history including CAD status post CABG, PVD status post stenting, stage IV non-small cell lung cancer who was initially diagnosed in May 2022.  He completed 4 cycles of CarboTaxol plus Keytruda and is now on maintenance Keytruda.  His last treatment was held secondary to poor tolerance.  He most recently had a scan on 01/11/2021 which revealed partial response with a reduction in size of left lower lobe mass.  He has been evaluated on several occasions but most recently on 03/22/2021 for fatigue following treatment and a rash located on the back of his neck.  Patient is fairly active and plays softball and does quite a bit of traveling.  He was given a liter of normal saline and steroids.  Rash thought to be secondary to CPAP machine strap.  He was given a prescription for topical Bactroban.  Interval history-today, patient presents to symptom management to discuss symptoms of dizziness.  Symptoms started approximately 2 days ago and are worse when changing positions.  Reports having to change positions slowly standing for a minute or so to get his balance.  He is eating and drinking well and denies dehydration.  He was recently started on  prednisone 20 mg daily for the past 3 days for weakness.  He does take blood pressure medicine.  He denies any nausea or vomiting, constipation or diarrhea.  He is attending a wedding this weekend and wants to feel well.  He denies any falls.  ECOG FS:1 - Symptomatic but completely ambulatory  Review of systems- Review of Systems  Constitutional:  Positive for malaise/fatigue.  Neurological:  Positive for dizziness. Negative for focal weakness.    Current treatment-status post 4 cycles of CarboTaxol plus Keytruda followed by 1 cycle of single agent Keytruda last given on 03/14/2021.  Keytruda held last week due to poor tolerance fatigue.  Allergies  Allergen Reactions   Flomax [Tamsulosin] Other (See Comments)    Orthostatic dizziness     Past Medical History:  Diagnosis Date   Allergy    seasonal   Arthritis    all over- in general    CAD (coronary artery disease)    a. inferior wall MI 10/01 s/p PCI/DES to RCA; b. Myoview 3/16 neg for ischemia; c. LHC 8/16: ostLAD 80%, OM1 70%, OM2 70% x 2 lesions, mRCA 30%, dRCA 70% s/p 4-V CABG 01/24/15 (LIMA-LAD, VG- OM1, VG-OM2, VG-PDA)    Cancer (HCC)    skin, melanoma   Carotid artery disease ([place])    a. [place] 8/16: 1-39% bilateral ICA stenosis   Cataract    removed   Diastolic dysfunction    a. TTE 8/16: EF 55-60%, no RWMA, Gr1DD, calcified mitral annulus, mild biatrial enlargement   Erectile dysfunction    GERD (gastroesophageal reflux disease)    History of elbow surgery  History of hiatal hernia    HLD (hyperlipidemia)    HTN (hypertension)    Inferior myocardial infarction ([place]) 03/2000   stent RCA   Lung cancer ([place])    Postoperative wound infection 02/02/2015   Reflux esophagitis    Sleep apnea 2017   CPAP at night     Past Surgical History:  Procedure Laterality Date   arm surgery  2010   BROW LIFT Bilateral 11/25/2019   Procedure: BROW PTOSIS REPAIR BILATERAL;  Surgeon: Karle Starch, MD;  Location: [facility];  Service: Ophthalmology;  Laterality: Bilateral;  sleep apnea   CARDIAC CATHETERIZATION  06/24/2011   CARDIAC CATHETERIZATION N/A 01/18/2015   Procedure: Left Heart Cath with coronary angiography;  Surgeon: Minna Merritts, MD;  Location: [facility];  Service: Cardiovascular;  Laterality: N/A;   CARDIAC CATHETERIZATION N/A 01/18/2015   Procedure: Intravascular Pressure Wire/FFR Study;  Surgeon: [name], MD;  Location: [facility];  Service: Cardiovascular;  Laterality: N/A;   CAROTID STENT  03/10/2011   COLONOSCOPY  2010   COLONOSCOPY  06/14/2014   Dr Hilarie Fredrickson   CORONARY ARTERY BYPASS GRAFT N/A 01/24/2015   Procedure: CORONARY ARTERY BYPASS GRAFTING x 4 (LIMA-LAD, SVG-Int 1- Int 2, SVG-PD) ENDOSCOPIC GREATER SAPHENOUS VEIN HARVEST LEFT LEG;  Surgeon: [name], MD;  Location: [facility];  Service: Open Heart Surgery;  Laterality: N/A;   EMBOLECTOMY  06/15/2019   Procedure: EMBOLECTOMY;  Surgeon: Katha Cabal, MD;  Location: [facility];  Service: Vascular;;  right superficial femoral artery   ENDARTERECTOMY FEMORAL Right 06/15/2019   Procedure: ENDARTERECTOMY FEMORAL;  Surgeon: Katha Cabal, MD;  Location: [facility];  Service: Vascular;  Laterality: Right;  common femoral profunda femoris superficial femoral   ESOPHAGOGASTRODUODENOSCOPY (EGD) WITH PROPOFOL N/A 04/24/2016   Procedure: ESOPHAGOGASTRODUODENOSCOPY (EGD) WITH PROPOFOL;  Surgeon: Jerene Bears, MD;  Location: [facility];  Service: Gastroenterology;  Laterality: N/A;   EYE SURGERY     lasik 15 yrs. ago, cataracts removed - both eyes    HAMMER TOE SURGERY     right toe   INSERTION OF ILIAC STENT Right 06/15/2019   Procedure: INSERTION OF ILIAC STENT ( STENTING OF SFA/POP ARTERY );  Surgeon: Katha Cabal, MD;  Location: [facility];  Service: Vascular;  Laterality: Right;  angioplpasty and stent placement: right superficial femoral right tibiopopliteal trunk bilateral common iliac  arteries   IR IMAGING GUIDED PORT INSERTION  11/09/2020   LEFT HEART CATH AND CORONARY ANGIOGRAPHY Left 06/10/2017   Procedure: LEFT HEART CATH AND CORONARY ANGIOGRAPHY;  Surgeon: Minna Merritts, MD;  Location: [facility];  Service: Cardiovascular;  Laterality: Left;   LOWER EXTREMITY ANGIOGRAPHY Left 01/04/2019   Procedure: LOWER EXTREMITY ANGIOGRAPHY;  Surgeon: Katha Cabal, MD;  Location: [facility];  Service: Cardiovascular;  Laterality: Left;   LOWER EXTREMITY ANGIOGRAPHY Right 01/25/2019   Procedure: LOWER EXTREMITY ANGIOGRAPHY;  Surgeon: Katha Cabal, MD;  Location: [facility];  Service: Cardiovascular;  Laterality: Right;   LOWER EXTREMITY ANGIOGRAPHY Right 01/15/2021   LOWER EXTREMITY ANGIOGRAPHY and stent placement to R SFA and popliteal artery Delana Meyer, Dolores Lory, MD)   NASAL SINUS SURGERY  2008   septpolasty, bilateral turbinate reduction   SHOULDER ARTHROSCOPY  2012   TEE WITHOUT CARDIOVERSION N/A 01/24/2015   Procedure: TRANSESOPHAGEAL ECHOCARDIOGRAM (TEE);  Surgeon: [name], MD;  Location: [facility];  Service: Open Heart Surgery;  Laterality: N/A;  TOE SURGERY  1994   UPPER GI ENDOSCOPY  07/2014, 04-24-16   Dr Raquel James   WRIST SURGERY  2011    Social History   Socioeconomic History   Marital status: Single    Spouse name: Not on file   Number of children: Not on file   Years of education: 12   Highest education level: Not on file  Occupational History   Occupation: retired    Comment: [employer]  Tobacco Use   Smoking status: Former    Packs/day: 2.50    Years: 40.00    Pack years: 100.00    Types: Cigarettes    Quit date: 03/24/2000    Years since quitting: 21.0   Smokeless tobacco: Never  Vaping Use   Vaping Use: Never used  Substance and Sexual Activity   Alcohol use: Yes    Comment: occassionally   Drug use: No   Sexual activity: Yes  Other Topics Concern   Not on file  Social History Narrative    Singled; lives with son and dog    Occ: retired, back part time at [employer];    Activity: gym 4-5d/wk   Diet: good water, fruits/vegetables daily   Caffeine Use-yes      ------------------------------------       Car sales- retd; [employer]- retd; quit smoking 2001. Alcohol couple nights a week. Live in [place]. Daughter lives 10 mins.    Social Determinants of Health   Financial Resource Strain: Not on file  Food Insecurity: Not on file  Transportation Needs: Not on file  Physical Activity: Not on file  Stress: Not on file  Social Connections: Not on file  Intimate Partner Violence: Not on file    Family History  Problem Relation Age of Onset   Hypertension Mother    Heart disease Mother    Hypertension Father    Diabetes Father    Lymphoma Sister    Heart disease Brother 3   Cancer Paternal Grandfather    Colon cancer Neg Hx    Prostate cancer Neg Hx    Bladder Cancer Neg Hx    Kidney cancer Neg Hx      Current Outpatient Medications:    acetaminophen (TYLENOL) 325 MG tablet, Take 650 mg by mouth as needed for moderate pain or mild pain., Disp: , Rfl:    Ascorbic Acid (VITAMIN C) 1000 MG tablet, Take 1,000 mg by mouth daily., Disp: , Rfl:    aspirin 81 MG EC tablet, Take 81 mg by mouth daily.  , Disp: , Rfl:    Calcium-Magnesium-Vitamin D (CALCIUM 1200+D3 PO), Take 1 tablet by mouth daily., Disp: , Rfl:    Carboxymethylcellul-Glycerin (LUBRICATING EYE DROPS OP), Place 1 drop into both eyes daily as needed (irritation)., Disp: , Rfl:    Cholecalciferol (VITAMIN D3) 50 MCG (2000 UT) TABS, Take 2,000 Units by mouth daily., Disp: , Rfl:    clopidogrel (PLAVIX) 75 MG tablet, Take 1 tablet (75 mg total) by mouth daily., Disp: 30 tablet, Rfl: 11   Cyanocobalamin (B-12) 5000 MCG CAPS, Take 5,000 mcg by mouth daily., Disp: , Rfl:    docusate sodium (COLACE) 100 MG capsule, Take 200 mg by mouth at bedtime., Disp: , Rfl:    erythromycin ophthalmic ointment, SMARTSIG:In  Eye(s), Disp: , Rfl:    lidocaine-prilocaine (EMLA) cream, Apply 30 -45 mins prior to port access., Disp: 30 g, Rfl: 0   lisinopril (ZESTRIL) 5 MG tablet, TAKE 1 TABLET BY MOUTH DAILY, Disp:  90 tablet, Rfl: 3   Melatonin 10 MG CAPS, Take 10 mg by mouth at bedtime as needed (sleep)., Disp: , Rfl:    metoprolol succinate (TOPROL-XL) 25 MG 24 hr tablet, TAKE 1/2 TABLET (12.5 MG) BY MOUTH DAILY, Disp: 45 tablet, Rfl: 3   mupirocin cream (BACTROBAN) 2 %, Apply 1 application topically 2 (two) times daily., Disp: 15 g, Rfl: 0   mupirocin ointment (BACTROBAN) 2 %, Apply 1 application topically 3 (three) times daily., Disp: , Rfl:    Niacinamide-Zn-Cu-Methfo-Se-Cr (NICOTINAMIDE PO), Take 500 mg by mouth daily., Disp: , Rfl:    nitroGLYCERIN (NITROSTAT) 0.4 MG SL tablet, Place 1 tablet (0.4 mg total) under the tongue every 5 (five) minutes as needed for up to 25 doses for chest pain. (Patient not taking: Reported on 04/04/2021), Disp: 25 tablet, Rfl: 1   ondansetron (ZOFRAN) 8 MG tablet, One pill every 8 hours as needed for nausea/vomitting. (Patient not taking: Reported on 04/04/2021), Disp: 40 tablet, Rfl: 1   pantoprazole (PROTONIX) 40 MG tablet, TAKE 1 TABLET BY MOUTH DAILY (Patient taking differently: Take 40 mg by mouth daily.), Disp: 90 tablet, Rfl: 3   Polyethyl Glycol-Propyl Glycol (LUBRICANT EYE DROPS) 0.4-0.3 % SOLN, Place 1-2 drops into both eyes 3 (three) times daily as needed (burning eyes.)., Disp: , Rfl:    predniSONE (DELTASONE) 20 MG tablet, Take 1 tablet (20 mg total) by mouth daily with breakfast. Once a day with food, Disp: 20 tablet, Rfl: 0   prochlorperazine (COMPAZINE) 10 MG tablet, Take 1 tablet (10 mg total) by mouth every 6 (six) hours as needed for nausea or vomiting. (Patient not taking: Reported on 04/04/2021), Disp: 40 tablet, Rfl: 1   simvastatin (ZOCOR) 40 MG tablet, TAKE 1 TABLET BY MOUTH AT BEDTIME, Disp: 90 tablet, Rfl: 0   traMADol (ULTRAM) 50 MG tablet, TAKE 1 TABLET BY  MOUTH 3 TIMES DAILY AS NEEDED FOR MODERATE PAIN, Disp: 90 tablet, Rfl: 0   triamcinolone ointment (KENALOG) 0.5 %, , Disp: , Rfl:    Vitamin E 450 MG (1000 UT) CAPS, Take 450 Units by mouth daily., Disp: , Rfl:    Zinc 50 MG TABS, Take 50 mg by mouth daily., Disp: , Rfl:  No current facility-administered medications for this visit.  Facility-Administered Medications Ordered in Other Visits:    heparin lock flush 100 UNIT/ML injection, , , ,   Physical exam: There were no vitals filed for this visit. Physical Exam Constitutional:      Appearance: Normal appearance.  HENT:     Head: Normocephalic and atraumatic.  Eyes:     Pupils: Pupils are equal, round, and reactive to light.  Cardiovascular:     Rate and Rhythm: Normal rate and regular rhythm.     Heart sounds: Normal heart sounds. No murmur heard. Pulmonary:     Effort: Pulmonary effort is normal.     Breath sounds: Normal breath sounds. No wheezing.  Abdominal:     General: Bowel sounds are normal. There is no distension.     Palpations: Abdomen is soft.     Tenderness: There is no abdominal tenderness.  Musculoskeletal:        General: Normal range of motion.     Cervical back: Normal range of motion.  Skin:    General: Skin is warm and dry.     Findings: No rash.  Neurological:     Mental Status: He is alert and oriented to person, place, and time.  Psychiatric:  Judgment: Judgment normal.     CMP Latest Ref Rng & Units 04/04/2021  Glucose 70 - 99 mg/dL 106(H)  BUN 8 - 23 mg/dL 16  Creatinine 0.61 - 1.24 mg/dL 0.96  Sodium 135 - 145 mmol/L 138  Potassium 3.5 - 5.1 mmol/L 4.0  Chloride 98 - 111 mmol/L 106  CO2 22 - 32 mmol/L 26  Calcium 8.9 - 10.3 mg/dL 9.4  Total Protein 6.5 - 8.1 g/dL 6.8  Total Bilirubin 0.3 - 1.2 mg/dL 0.7  Alkaline Phos 38 - 126 U/L 53  AST 15 - 41 U/L 18  ALT 0 - 44 U/L 18   CBC Latest Ref Rng & Units 04/04/2021  WBC 4.0 - 10.5 K/uL 5.8  Hemoglobin 13.0 - 17.0 g/dL 14.2   Hematocrit 39.0 - 52.0 % 41.2  Platelets 150 - 400 K/uL 170    No images are attached to the encounter.  No results found.  Assessment and plan- Patient is a 80 y.o. male who presents to symptom management for evaluation of dehydration.  Stage IV non-small cell lung cancer-status post 4 cycles of CarboTaxol plus Keytruda now on maintenance Keytruda.  Last [name] was given on 03/14/2021.  More recently [name] was held secondary to poor tolerance and worsening fatigue requiring IV fluids.  Additionally he was started on prednisone 20 mg daily.  States this is made him feel better.  Dizziness -labs from today are fairly unremarkable.  Patient has a wedding to attend this weekend and would like to feel better.  Patient is eating and drinking well and does not appear dehydrated.   Patient is not orthostatic.  Recommend he change positions slowly and let us know if the dizziness worsens.  Continue steroids as prescribed.  Disposition-RTC as scheduled to see Dr. Rogue Bussing on 04/18/2021.   Visit Diagnosis 1. Cancer of lower lobe of left lung ([place])   2. Dehydration     Patient expressed understanding and was in agreement with this plan. He also understands that He can call clinic at any time with any questions, concerns, or complaints.   I spent 25 minutes dedicated to the care of this patient (face-to-face and non-face-to-face) on the date of the encounter to include what is described in the assessment and plan.  Thank you for allowing me to participate in the care of this very pleasant patient.    Jacquelin Hawking, NP [place] [facility] Cell - [phone] Pager- [phone] 04/09/2021 12:01 PM

## 2021-04-09 NOTE — Telephone Encounter (Signed)
Patient called reporting that he is having dizziness for the past couple of days. States when he gets in the floor to do exercises or when he gets up at night to go to the bathroom is when it is worst. He denies any other symptoms, no shortness of breath, no congestion, no headache, no pain. Please advise

## 2021-04-09 NOTE — Telephone Encounter (Signed)
Is he eating and drinking well? If not he can come in for labs and IV fluids.   Faythe Casa, NP 04/09/2021 10:35 AM

## 2021-04-09 NOTE — Telephone Encounter (Signed)
Will you get him set up for IV fluids poss this am and labs?  Faythe Casa, NP 04/09/2021 10:40 AM

## 2021-04-10 ENCOUNTER — Telehealth: Payer: Self-pay | Admitting: *Deleted

## 2021-04-10 NOTE — Telephone Encounter (Signed)
Pt continues to experience dizziness after receiving IV fluids yesterday. Pt stated that Randall Ali said he could hold medication to see if that helps. Per Randall Ali, pt may hold metoprolol at this time and to monitor BP at home. Pt's PCP, Dr. Danise Mina, made aware and in agreement with plan. Instructed pt that if no improvement noted then will need to follow up with PCP in about 1 week.

## 2021-04-14 NOTE — Progress Notes (Deleted)
Cardiology Office Note:    Date:  04/14/2021   ID:  Randall Lofts., DOB August 31, 1940, MRN [id number]  PCP:  Ria Bush, MD  [facility] Cardiologist:  [name], MD  [facility] Electrophysiologist:  None   Referring MD: Ria Bush, MD   Chief Complaint: 4 month follow-up  History of Present Illness:    Randall Bogard. is a 80 y.o. male with a hx of CAD with inferior MI in 2001 s/p PCI/DES to the RCA at that time s/p CABGx4 on 01/2015 with post-op afib, carotid artery disease, extensive PAD, prior smoker (quit 12-13 years ago), HTN, HLD, GERD with EGD in 2016 showing severe esophagitis, chronic back pain, ED, obesity, OSA on CPAP, and recently diagnosed Non-small-cell lung cancer on chemotherapy who presents for follow-up.    Admitted in 2011 for chest pain and diaphoresis. Stress test showed no ischemia with scar. Repeat admission in 2013 with worsening chest pain with LHC showing moderate OM disease, otherwise no significant stenosis. Recurrent chest pain in spring of 2016 with nuclear stress test negative for ischemia, EF 54%, low risk study. He continued to have chest pain leading to left heart cath in August 2016 that showed severe three-vessel CAD.  Given multiple vessel CAD, he underwent four-vessel CABG on 01/24/2015 (LIMA to LAD, SVG to OM1, SVG to OM2, SVG to RPDA).  Postop course was complicated by A. fib, delirium, wound infection from sternal wound with cultures growing Enterobacter aerogenes.  He was seen in late 2018 with chest pain and underwent diagnostic cath 06/2017 showing severe three-vessel CAD including an occluded SVG to RCA.  The patient had a patent LIMA to LAD as well as patent SVG to OM1 and OM 2.  Medical management was recommended with deferment of intervention being reserved for refractory angina.  Echo 06/18/2017 showed EF 55 to 60%, normal wall motion, grade 1 diastolic dysfunction, mild MR, mildly dilated left atrium.   He was seen in  the office 06/28/2018 and denied any cardiac symptoms. Given his significant atherosclerotic disease initiation of Xarelto 2.5 mg was discussed with the patient, which was declined. Patient contacted the office in 10/27/2018 after getting hearing aids, with report of "hearing his heartbeat". Labs were unrevealing.  EKG shows sinus rhythm with rare PVC.   Patient has a long history of PAD with prior stenting followed by vascular surgery. In 12/2018 he underwent PCI with stent placement in the right common iliac and left common iliac. He also had angioplasty to bilateral external iliac arteries. ABIs 08/2020 showed mild right lower arterial disease and mild left lower arterial disease with right ABI 0.81 nad left ABO 0.93.    Last seen 12/11/20 and he was stable from a cardiac perspective.     Past Medical History:  Diagnosis Date   Allergy    seasonal   Arthritis    all over- in general    CAD (coronary artery disease)    a. inferior wall MI 10/01 s/p PCI/DES to RCA; b. Myoview 3/16 neg for ischemia; c. LHC 8/16: ostLAD 80%, OM1 70%, OM2 70% x 2 lesions, mRCA 30%, dRCA 70% s/p 4-V CABG 01/24/15 (LIMA-LAD, VG- OM1, VG-OM2, VG-PDA)    Cancer (HCC)    skin, melanoma   Carotid artery disease ([place])    a. [place] 8/16: 1-39% bilateral ICA stenosis   Cataract    removed   Diastolic dysfunction    a. TTE 8/16: EF 55-60%, no RWMA, Gr1DD, calcified mitral annulus, mild  biatrial enlargement   Erectile dysfunction    GERD (gastroesophageal reflux disease)    History of elbow surgery    History of hiatal hernia    HLD (hyperlipidemia)    HTN (hypertension)    Inferior myocardial infarction ([place]) 03/2000   stent RCA   Lung cancer ([place])    Postoperative wound infection 02/02/2015   Reflux esophagitis    Sleep apnea 2017   CPAP at night    Past Surgical History:  Procedure Laterality Date   arm surgery  2010   BROW LIFT Bilateral 11/25/2019   Procedure: BROW PTOSIS REPAIR BILATERAL;  Surgeon: Karle Starch, MD;  Location: [place];  Service: Ophthalmology;  Laterality: Bilateral;  sleep apnea   CARDIAC CATHETERIZATION  06/24/2011   CARDIAC CATHETERIZATION N/A 01/18/2015   Procedure: Left Heart Cath with coronary angiography;  Surgeon: Minna Merritts, MD;  Location: [place] [facility];  Service: Cardiovascular;  Laterality: N/A;   CARDIAC CATHETERIZATION N/A 01/18/2015   Procedure: Intravascular Pressure Wire/FFR Study;  Surgeon: [name], MD;  Location: [place] [facility];  Service: Cardiovascular;  Laterality: N/A;   CAROTID STENT  03/10/2011   COLONOSCOPY  2010   COLONOSCOPY  06/14/2014   Dr Hilarie Fredrickson   CORONARY ARTERY BYPASS GRAFT N/A 01/24/2015   Procedure: CORONARY ARTERY BYPASS GRAFTING x 4 (LIMA-LAD, SVG-Int 1- Int 2, SVG-PD) ENDOSCOPIC GREATER SAPHENOUS VEIN HARVEST LEFT LEG;  Surgeon: [name], MD;  Location: [place];  Service: Open Heart Surgery;  Laterality: N/A;   EMBOLECTOMY  06/15/2019   Procedure: EMBOLECTOMY;  Surgeon: Katha Cabal, MD;  Location: [facility];  Service: Vascular;;  right superficial femoral artery   ENDARTERECTOMY FEMORAL Right 06/15/2019   Procedure: ENDARTERECTOMY FEMORAL;  Surgeon: Katha Cabal, MD;  Location: [facility];  Service: Vascular;  Laterality: Right;  common femoral profunda femoris superficial femoral   ESOPHAGOGASTRODUODENOSCOPY (EGD) WITH PROPOFOL N/A 04/24/2016   Procedure: ESOPHAGOGASTRODUODENOSCOPY (EGD) WITH PROPOFOL;  Surgeon: Jerene Bears, MD;  Location: [facility];  Service: Gastroenterology;  Laterality: N/A;   EYE SURGERY     lasik 15 yrs. ago, cataracts removed - both eyes    HAMMER TOE SURGERY     right toe   INSERTION OF ILIAC STENT Right 06/15/2019   Procedure: INSERTION OF ILIAC STENT ( STENTING OF SFA/POP ARTERY );  Surgeon: Katha Cabal, MD;  Location: [facility];  Service: Vascular;  Laterality: Right;  angioplpasty and stent placement: right superficial femoral right  tibiopopliteal trunk bilateral common iliac arteries   IR IMAGING GUIDED PORT INSERTION  11/09/2020   LEFT HEART CATH AND CORONARY ANGIOGRAPHY Left 06/10/2017   Procedure: LEFT HEART CATH AND CORONARY ANGIOGRAPHY;  Surgeon: Minna Merritts, MD;  Location: [place] [facility];  Service: Cardiovascular;  Laterality: Left;   LOWER EXTREMITY ANGIOGRAPHY Left 01/04/2019   Procedure: LOWER EXTREMITY ANGIOGRAPHY;  Surgeon: Katha Cabal, MD;  Location: [place] [facility];  Service: Cardiovascular;  Laterality: Left;   LOWER EXTREMITY ANGIOGRAPHY Right 01/25/2019   Procedure: LOWER EXTREMITY ANGIOGRAPHY;  Surgeon: Katha Cabal, MD;  Location: [place] [facility];  Service: Cardiovascular;  Laterality: Right;   LOWER EXTREMITY ANGIOGRAPHY Right 01/15/2021   LOWER EXTREMITY ANGIOGRAPHY and stent placement to R SFA and popliteal artery Delana Meyer, Dolores Lory, MD)   NASAL SINUS SURGERY  2008   septpolasty, bilateral turbinate reduction   SHOULDER ARTHROSCOPY  2012   TEE WITHOUT CARDIOVERSION N/A 01/24/2015   Procedure:  TRANSESOPHAGEAL ECHOCARDIOGRAM (TEE);  Surgeon: [name], MD;  Location: [place];  Service: Open Heart Surgery;  Laterality: N/A;   TOE SURGERY  1994   UPPER GI ENDOSCOPY  07/2014, 04-24-16   Dr Raquel James   WRIST SURGERY  2011    Current Medications: No outpatient medications have been marked as taking for the 04/15/21 encounter (Appointment) with Kathlen Mody, Dayla Gasca H, PA-C.     Allergies:   Flomax [tamsulosin]   Social History   Socioeconomic History   Marital status: Single    Spouse name: Not on file   Number of children: Not on file   Years of education: 12   Highest education level: Not on file  Occupational History   Occupation: retired    Comment: [employer]  Tobacco Use   Smoking status: Former    Packs/day: 2.50    Years: 40.00    Pack years: 100.00    Types: Cigarettes    Quit date: 03/24/2000    Years since quitting: 21.0   Smokeless tobacco:  Never  Vaping Use   Vaping Use: Never used  Substance and Sexual Activity   Alcohol use: Yes    Comment: occassionally   Drug use: No   Sexual activity: Yes  Other Topics Concern   Not on file  Social History Narrative   Singled; lives with son and dog    Occ: retired, back part time at [employer];    Activity: gym 4-5d/wk   Diet: good water, fruits/vegetables daily   Caffeine Use-yes      ------------------------------------       Car sales- retd; [employer]- retd; quit smoking 2001. Alcohol couple nights a week. Live in [place]. Daughter lives 10 mins.    Social Determinants of Health   Financial Resource Strain: Not on file  Food Insecurity: Not on file  Transportation Needs: Not on file  Physical Activity: Not on file  Stress: Not on file  Social Connections: Not on file     Family History: [name] ***family history includes Cancer in his paternal grandfather; Diabetes in his father; Heart disease in his mother; Heart disease (age of onset: 49) in his brother; Hypertension in his father and mother; Lymphoma in his sister. There is no history of Colon cancer, Prostate cancer, Bladder Cancer, or Kidney cancer.  ROS:   Please see the history of present illness.    *** All other systems reviewed and are negative.  EKGs/Labs/Other Studies Reviewed:    The following studies were reviewed today: ***  EKG:  EKG is *** ordered today.  The ekg ordered today demonstrates ***  Recent Labs: 03/22/2021: TSH 0.824 04/09/2021: ALT 18; BUN 24; Creatinine, Ser 1.12; Hemoglobin 14.5; Platelets 178; Potassium 3.8; Sodium 137  Recent Lipid Panel    Component Value Date/Time   CHOL 119 03/15/2020 0804   TRIG 46.0 03/15/2020 0804   HDL 47.60 03/15/2020 0804   CHOLHDL 2 03/15/2020 0804   VLDL 9.2 03/15/2020 0804   LDLCALC 62 03/15/2020 0804   LDLDIRECT 63.0 08/17/2015 0824     Risk Assessment/Calculations:   {Does this patient have ATRIAL  FIBRILLATION?:[id number]}   Physical Exam:    VS:  There were no vitals taken for this visit.    Wt Readings from Last 3 Encounters:  04/04/21 217 lb 12.8 oz (98.8 kg)  03/22/21 217 lb (98.4 kg)  03/14/21 217 lb (98.4 kg)     GEN: *** Well nourished, well developed in no acute distress HEENT:  Normal NECK: No JVD; No carotid bruits LYMPHATICS: No lymphadenopathy CARDIAC: ***RRR, no murmurs, rubs, gallops RESPIRATORY:  Clear to auscultation without rales, wheezing or rhonchi  ABDOMEN: Soft, non-tender, non-distended MUSCULOSKELETAL:  No edema; No deformity  SKIN: Warm and dry NEUROLOGIC:  Alert and oriented x 3 PSYCHIATRIC:  Normal affect   ASSESSMENT:    No diagnosis found. PLAN:    In order of problems listed above  CAD s/p CABg x4  H/o post-op afib in 2016  Dyslipidemia  HTN  PAD with prior stenting  Disposition: Follow up {follow up:15908} with ***   Shared Decision Making/Informed Consent   {Are you ordering a CV Procedure (e.g. stress test, cath, DCCV, TEE, etc)?   Press F2        :[id number]}    Signed, Ewing Fandino Ninfa Meeker, PA-C  04/14/2021 9:48 PM    [facility]

## 2021-04-15 ENCOUNTER — Ambulatory Visit: Payer: PPO | Admitting: Medical

## 2021-04-17 NOTE — Progress Notes (Signed)
Cardiology Office Note    Date:  04/22/2021   ID:  [name], DOB 1940/10/23, MRN [id number]  PCP:  Ria Bush, MD  Cardiologist:  [name], MD  Electrophysiologist:  None   Chief Complaint: Follow up  History of Present Illness:   Jorryn Casagrande. is a 80 y.o. male with history of CAD with inferior MI in 2001 status post PCI/DES to the RCA at that time status post four-vessel CABG on 01/24/2015 with postoperative A. fib, stage IV non-small cell lung cancer diagnosed in 10/2020 status post chemotherapy, carotid artery disease, extensive PAD followed by vascular surgery, HTN, HLD, GERD with EGD in 07/2014 showing severe esophagitis, chronic back pain, ED, obesity, and OSA on CPAP who presents for follow up of his CAD.   He was amitted in 2011 for chest pain and diaphoresis. Stress test showed no ischemia with scar. Repeat admission in 2013 with worsening, exertional chest pain with LHC showing moderate OM disease, otherwise no significant stenosis. Recurrent chest pain in spring of 2016 with nuclear stress test negative for ischemia, EF 54%, low risk study. He continued to note chest pain leading to a LHC in 01/2015 that showed severe 3-vessel CAD.  Given multivessel CAD, he underwent four-vessel CABG on 01/24/2015 (LIMA to LAD, SVG to OM1, SVG to OM2, SVG to rPDA).  Postoperative course was complicated by postop A. fib, delirium, and wound infection from sternal wound with cultures growing Enterobacter aerogenes.  He was seen in late 2018 with chest pain and underwent diagnostic cath in 06/2017 which showed severe three-vessel CAD including an occluded SVG to RCA.  The patient had a patent LIMA to LAD as well as patent SVG to OM1 and OM2.  Medical management was recommended with deferment of intervention being reserved for refractory angina.  Echo on 06/18/2017 showed an EF of 55 to 60%, normal wall motion, grade 1 diastolic dysfunction, mild mitral regurgitation, mildly dilated  left atrium.  He was diagnosed with non-small cell lung cancer, stage IV in 10/2020.  Imaging has not demonstrated metastasis to the brain.  He has been treated with CarboTaxol plus Keytruda with noted extreme fatigue with Keytruda.   With regards to his PAD, he is followed by vascular surgery and underwent stenting to the right common iliac and left common iliac artery in 12/2018 as well as angioplasty to the bilateral external iliac arteries.  He most recently underwent angioplasty and stent placement to the right superficial femoral artery and popliteal artery in 01/2021.  Most recent ABIs from 01/2021 were normal with a right-sided ABI of 1.1 which was increased from prior study of 0.51, and a stable left-sided ABI of 0.98.  He was last seen in our office in 12/2020 and was doing reasonably well from a cardiac perspective without any active cardiac issues or concerns.  He comes in today and reports she is overall generally not feeling well.  He notes significant fatigue and exertional dyspnea at times, particularly with climbing stairs.  No chest pain.  No lower extremity swelling.  His weight is up 10 pounds when compared to his last clinic visit, though he attributes this to increased caloric intake and prednisone use.  No lower extremity swelling or orthopnea.   Labs independently reviewed: 04/2021 - Hgb 14.1, PLT 154, potassium 3.9, BUN 27, serum creatinine 0.98, albumin 4.2, AST/ALT normal 03/2021 - TSH normal 03/2020 - TC 119, TG 46, HDL 47, LDL 62 06/2019 - magnesium 2.1  Past Medical  History:  Diagnosis Date   Allergy    seasonal   Arthritis    all over- in general    CAD (coronary artery disease)    a. inferior wall MI 10/01 s/p PCI/DES to RCA; b. Myoview 3/16 neg for ischemia; c. LHC 8/16: ostLAD 80%, OM1 70%, OM2 70% x 2 lesions, mRCA 30%, dRCA 70% s/p 4-V CABG 01/24/15 (LIMA-LAD, VG- OM1, VG-OM2, VG-PDA)    Cancer (HCC)    skin, melanoma   Carotid artery disease ([place])    a. [place]  8/16: 1-39% bilateral ICA stenosis   Cataract    removed   Diastolic dysfunction    a. TTE 8/16: EF 55-60%, no RWMA, Gr1DD, calcified mitral annulus, mild biatrial enlargement   Erectile dysfunction    GERD (gastroesophageal reflux disease)    History of elbow surgery    History of hiatal hernia    HLD (hyperlipidemia)    HTN (hypertension)    Inferior myocardial infarction ([place]) 03/2000   stent RCA   Lung cancer ([place])    Postoperative wound infection 02/02/2015   Reflux esophagitis    Sleep apnea 2017   CPAP at night    Past Surgical History:  Procedure Laterality Date   arm surgery  2010   BROW LIFT Bilateral 11/25/2019   Procedure: BROW PTOSIS REPAIR BILATERAL;  Surgeon: Karle Starch, MD;  Location: [facility];  Service: Ophthalmology;  Laterality: Bilateral;  sleep apnea   CARDIAC CATHETERIZATION  06/24/2011   CARDIAC CATHETERIZATION N/A 01/18/2015   Procedure: Left Heart Cath with coronary angiography;  Surgeon: Minna Merritts, MD;  Location: [facility];  Service: Cardiovascular;  Laterality: N/A;   CARDIAC CATHETERIZATION N/A 01/18/2015   Procedure: Intravascular Pressure Wire/FFR Study;  Surgeon: [name], MD;  Location: [facility];  Service: Cardiovascular;  Laterality: N/A;   CAROTID STENT  03/10/2011   COLONOSCOPY  2010   COLONOSCOPY  06/14/2014   Dr Hilarie Fredrickson   CORONARY ARTERY BYPASS GRAFT N/A 01/24/2015   Procedure: CORONARY ARTERY BYPASS GRAFTING x 4 (LIMA-LAD, SVG-Int 1- Int 2, SVG-PD) ENDOSCOPIC GREATER SAPHENOUS VEIN HARVEST LEFT LEG;  Surgeon: [name], MD;  Location: [facility];  Service: Open Heart Surgery;  Laterality: N/A;   EMBOLECTOMY  06/15/2019   Procedure: EMBOLECTOMY;  Surgeon: Katha Cabal, MD;  Location: [facility];  Service: Vascular;;  right superficial femoral artery   ENDARTERECTOMY FEMORAL Right 06/15/2019   Procedure: ENDARTERECTOMY FEMORAL;  Surgeon: Katha Cabal, MD;  Location: [facility];   Service: Vascular;  Laterality: Right;  common femoral profunda femoris superficial femoral   ESOPHAGOGASTRODUODENOSCOPY (EGD) WITH PROPOFOL N/A 04/24/2016   Procedure: ESOPHAGOGASTRODUODENOSCOPY (EGD) WITH PROPOFOL;  Surgeon: Jerene Bears, MD;  Location: [facility];  Service: Gastroenterology;  Laterality: N/A;   EYE SURGERY     lasik 15 yrs. ago, cataracts removed - both eyes    HAMMER TOE SURGERY     right toe   INSERTION OF ILIAC STENT Right 06/15/2019   Procedure: INSERTION OF ILIAC STENT ( STENTING OF SFA/POP ARTERY );  Surgeon: Katha Cabal, MD;  Location: [facility];  Service: Vascular;  Laterality: Right;  angioplpasty and stent placement: right superficial femoral right tibiopopliteal trunk bilateral common iliac arteries   IR IMAGING GUIDED PORT INSERTION  11/09/2020   LEFT HEART CATH AND CORONARY ANGIOGRAPHY Left 06/10/2017   Procedure: LEFT HEART CATH AND CORONARY ANGIOGRAPHY;  Surgeon: Minna Merritts, MD;  Location: [facility];  Service: Cardiovascular;  Laterality: Left;   LOWER EXTREMITY ANGIOGRAPHY Left 01/04/2019   Procedure: LOWER EXTREMITY ANGIOGRAPHY;  Surgeon: Katha Cabal, MD;  Location: [facility];  Service: Cardiovascular;  Laterality: Left;   LOWER EXTREMITY ANGIOGRAPHY Right 01/25/2019   Procedure: LOWER EXTREMITY ANGIOGRAPHY;  Surgeon: Katha Cabal, MD;  Location: [facility];  Service: Cardiovascular;  Laterality: Right;   LOWER EXTREMITY ANGIOGRAPHY Right 01/15/2021   LOWER EXTREMITY ANGIOGRAPHY and stent placement to R SFA and popliteal artery Delana Meyer, Dolores Lory, MD)   NASAL SINUS SURGERY  2008   septpolasty, bilateral turbinate reduction   SHOULDER ARTHROSCOPY  2012   TEE WITHOUT CARDIOVERSION N/A 01/24/2015   Procedure: TRANSESOPHAGEAL ECHOCARDIOGRAM (TEE);  Surgeon: [name], MD;  Location: [facility];  Service: Open Heart Surgery;  Laterality: N/A;   TOE SURGERY  1994   UPPER GI ENDOSCOPY  07/2014,  04-24-16   Dr Raquel James   WRIST SURGERY  2011    Current Medications: Current Meds  Medication Sig   acetaminophen (TYLENOL) 325 MG tablet Take 650 mg by mouth as needed for moderate pain or mild pain.   Ascorbic Acid (VITAMIN C) 1000 MG tablet Take 1,000 mg by mouth daily.   aspirin 81 MG EC tablet Take 81 mg by mouth daily.     Calcium-Magnesium-Vitamin D (CALCIUM 1200+D3 PO) Take 1 tablet by mouth daily.   Carboxymethylcellul-Glycerin (LUBRICATING EYE DROPS OP) Place 1 drop into both eyes daily as needed (irritation).   Cholecalciferol (VITAMIN D3) 50 MCG (2000 UT) TABS Take 2,000 Units by mouth daily.   clopidogrel (PLAVIX) 75 MG tablet Take 1 tablet (75 mg total) by mouth daily.   Cyanocobalamin (B-12) 5000 MCG CAPS Take 5,000 mcg by mouth daily.   docusate sodium (COLACE) 100 MG capsule Take 200 mg by mouth at bedtime.   erythromycin ophthalmic ointment SMARTSIG:In Eye(s)   lidocaine-prilocaine (EMLA) cream Apply 30 -45 mins prior to port access.   lisinopril (ZESTRIL) 5 MG tablet TAKE 1 TABLET BY MOUTH DAILY   Melatonin 10 MG CAPS Take 10 mg by mouth at bedtime as needed (sleep).   mupirocin cream (BACTROBAN) 2 % Apply 1 application topically 2 (two) times daily.   mupirocin ointment (BACTROBAN) 2 % Apply 1 application topically 3 (three) times daily.   Niacinamide-Zn-Cu-Methfo-Se-Cr (NICOTINAMIDE PO) Take 500 mg by mouth daily.   nitroGLYCERIN (NITROSTAT) 0.4 MG SL tablet Place 1 tablet (0.4 mg total) under the tongue every 5 (five) minutes as needed for up to 25 doses for chest pain.   ondansetron (ZOFRAN) 8 MG tablet One pill every 8 hours as needed for nausea/vomitting.   pantoprazole (PROTONIX) 40 MG tablet TAKE 1 TABLET BY MOUTH DAILY (Patient taking differently: Take 40 mg by mouth daily.)   Polyethyl Glycol-Propyl Glycol (LUBRICANT EYE DROPS) 0.4-0.3 % SOLN Place 1-2 drops into both eyes 3 (three) times daily as needed (burning eyes.).   predniSONE (DELTASONE) 20 MG tablet  Take 1 tablet (20 mg total) by mouth daily with breakfast. Once a day with food   prochlorperazine (COMPAZINE) 10 MG tablet Take 1 tablet (10 mg total) by mouth every 6 (six) hours as needed for nausea or vomiting.   simvastatin (ZOCOR) 40 MG tablet TAKE 1 TABLET BY MOUTH AT BEDTIME   traMADol (ULTRAM) 50 MG tablet TAKE 1 TABLET BY MOUTH 3 TIMES DAILY AS NEEDED FOR MODERATE PAIN   triamcinolone ointment (KENALOG) 0.5 %    Vitamin E 450 MG (  1000 UT) CAPS Take 450 Units by mouth daily.   Zinc 50 MG TABS Take 50 mg by mouth daily.    Allergies:   Flomax [tamsulosin]   Social History   Socioeconomic History   Marital status: Single    Spouse name: Not on file   Number of children: Not on file   Years of education: 12   Highest education level: Not on file  Occupational History   Occupation: retired    Comment: [employer]  Tobacco Use   Smoking status: Former    Packs/day: 2.50    Years: 40.00    Pack years: 100.00    Types: Cigarettes    Quit date: 03/24/2000    Years since quitting: 21.0   Smokeless tobacco: Never  Vaping Use   Vaping Use: Never used  Substance and Sexual Activity   Alcohol use: Yes    Comment: occassionally   Drug use: No   Sexual activity: Yes  Other Topics Concern   Not on file  Social History Narrative   Singled; lives with son and dog    Occ: retired, back part time at [employer];    Activity: gym 4-5d/wk   Diet: good water, fruits/vegetables daily   Caffeine Use-yes      ------------------------------------       Car sales- retd; [employer]- retd; quit smoking 2001. Alcohol couple nights a week. Live in [place]. Daughter lives 10 mins.    Social Determinants of Health   Financial Resource Strain: Not on file  Food Insecurity: Not on file  Transportation Needs: Not on file  Physical Activity: Not on file  Stress: Not on file  Social Connections: Not on file     Family History:  [name] family history includes Cancer in his  paternal grandfather; Diabetes in his father; Heart disease in his mother; Heart disease (age of onset: 93) in his brother; Hypertension in his father and mother; Lymphoma in his sister. There is no history of Colon cancer, Prostate cancer, Bladder Cancer, or Kidney cancer.  ROS:   Review of Systems  Constitutional:  Positive for malaise/fatigue. Negative for chills, diaphoresis, fever and weight loss.  HENT:  Negative for congestion.   Eyes:  Negative for discharge and redness.  Respiratory:  Positive for shortness of breath. Negative for cough, sputum production and wheezing.   Cardiovascular:  Negative for chest pain, palpitations, orthopnea, claudication, leg swelling and PND.  Gastrointestinal:  Negative for abdominal pain, heartburn, nausea and vomiting.  Musculoskeletal:  Negative for falls and myalgias.  Skin:  Negative for rash.  Neurological:  Positive for weakness. Negative for dizziness, tingling, tremors, sensory change, speech change, focal weakness and loss of consciousness.  Endo/Heme/Allergies:  Does not bruise/bleed easily.  Psychiatric/Behavioral:  Negative for substance abuse. The patient is not nervous/anxious.   All other systems reviewed and are negative.   EKGs/Labs/Other Studies Reviewed:    Studies reviewed were summarized above. The additional studies were reviewed today:  2D echo 06/2017: - Left ventricle: The cavity size was normal. Systolic function was    normal. The estimated ejection fraction was in the range of 55%    to 60%. Wall motion was normal; there were no regional wall    motion abnormalities. Doppler parameters are consistent with    abnormal left ventricular relaxation (grade 1 diastolic    dysfunction).  - Mitral valve: Mildly calcified annulus. There was mild    regurgitation.  - Left atrium: The atrium was mildly  dilated.  - Pulmonary arteries: Systolic pressure could not be accurately    estimated.  __________  LHC 06/2017: Coronary  angiography:  Coronary dominance: Right  Left mainstem:   Large vessel that bifurcates into the LAD and left circumflex, no Mild diffuse disease  Left anterior descending (LAD):   Moderate sized vessel that extends to the apical region, diagonal branch 2 of small to moderate size, severe proximal and mid disease. Patent [place] to the LAD is patent  Left circumflex (LCx):  Moderate vessel with OM branch 2, Severe OM1 and OM2 disease, patent SVG to OM! Jump graft to OM2, patent with no significant disease  Right coronary artery (RCA):  Right dominant vessel moderate size with PL and PDA, severe proximal, mid and disease disease. SVG to PDA is occluded ostially  Left ventriculography: Left ventricular systolic function is normal, mild inferior wall hypokinesis, LVEF is estimated at 55%, there is no significant mitral regurgitation , no significant aortic valve stenosis  Final Conclusions:   Severe three vessel disease, occluded SVG to the RCA, Patent SVG to OM 1 and OM2, patent LIMA to the LAD  Recommendations:  New occluded RCA Discussed with him in detail Will try medical management first Add plavix, nitrates If he requests intervention, will have to be performed in [place] with atherectomy    EKG:  EKG is ordered today.  The EKG ordered today demonstrates NSR, 71 bpm, prior inferior infarct, baseline artifact, no acute ST-T changes, no significant changes when compared to prior tracing  Recent Labs: 03/22/2021: TSH 0.824 04/18/2021: ALT 18; BUN 27; Creatinine, Ser 0.98; Hemoglobin 14.1; Platelets 154; Potassium 3.9; Sodium 137  Recent Lipid Panel    Component Value Date/Time   CHOL 119 03/15/2020 0804   TRIG 46.0 03/15/2020 0804   HDL 47.60 03/15/2020 0804   CHOLHDL 2 03/15/2020 0804   VLDL 9.2 03/15/2020 0804   LDLCALC 62 03/15/2020 0804   LDLDIRECT 63.0 08/17/2015 0824    PHYSICAL EXAM:    VS:  BP 122/72   Pulse 66   Ht 5\' 8"  (1.727 m)   Wt 216 lb (98 kg)   SpO2 96%    BMI 32.84 kg/m   BMI: Body mass index is 32.84 kg/m.  Physical Exam Vitals reviewed.  Constitutional:      Appearance: He is well-developed.  HENT:     Head: Normocephalic and atraumatic.  Eyes:     General:        Right eye: No discharge.        Left eye: No discharge.  Neck:     Vascular: No JVD.  Cardiovascular:     Rate and Rhythm: Normal rate and regular rhythm. Occasional Extrasystoles are present.    Pulses:          Posterior tibial pulses are 1+ on the right side and 1+ on the left side.     Heart sounds: Normal heart sounds, S1 normal and S2 normal. Heart sounds not distant. No midsystolic click and no opening snap. No murmur heard.   No friction rub.  Pulmonary:     Effort: Pulmonary effort is normal. No respiratory distress.     Breath sounds: Normal breath sounds. No decreased breath sounds, wheezing or rales.  Chest:     Chest wall: No tenderness.  Abdominal:     General: There is no distension.     Palpations: Abdomen is soft.     Tenderness: There is no abdominal tenderness.  Musculoskeletal:  Cervical back: Normal range of motion.     Right lower leg: No edema.     Left lower leg: No edema.  Skin:    General: Skin is warm and dry.     Nails: There is no clubbing.  Neurological:     Mental Status: He is alert and oriented to person, place, and time.  Psychiatric:        Speech: Speech normal.        Behavior: Behavior normal.        Thought Content: Thought content normal.        Judgment: Judgment normal.    Wt Readings from Last 3 Encounters:  04/22/21 216 lb (98 kg)  04/18/21 217 lb 1.6 oz (98.5 kg)  04/04/21 217 lb 12.8 oz (98.8 kg)     ASSESSMENT & PLAN:   CAD status post CABG without angina: He is doing well without any symptoms concerning for angina.  He does note ongoing fatigue and intermittent exertional dyspnea.  Schedule echo given dyspnea.  Recent LHC in 2019 demonstrated patent LIMA to LAD and SVG jump graft to OM 1 and 2 with an  occluded SVG to RCA.  He remains on aspirin and clopidogrel given recent vascular intervention.  He remains on lisinopril, simvastatin, and as needed SL NTG.  No longer on metoprolol.  Postoperative A. fib: Maintaining sinus rhythm today.  Given his recently diagnosed lung cancer, he has an increased risk for redevelopment of atrial arrhythmia.  Given lone episode of A. fib occurring in the postoperative timeframe, he has not been maintained on [place].  Should he redevelop A. fib anticoagulation will need to be revisited at that time.  HTN: Blood pressure well controlled in the office today.   HLD: LDL 62 in 03/2020.  He remains on simvastatin.  PAD: Status post intervention.  Followed by vascular surgery.  Could consider addition of low-dose Xarelto.  Stage IV non-small cell lung cancer: Followed by oncology.  Disposition: F/u with [name] or an APP in 6 months.   Medication Adjustments/Labs and Tests Ordered: Current medicines are reviewed at length with the patient today.  Concerns regarding medicines are outlined above. Medication changes, Labs and Tests ordered today are summarized above and listed in the Patient Instructions accessible in Encounters.   Signed, Christell Faith, PA-C 04/22/2021 9:03 AM     [facility] [address] [place], [place] [address] [phone]

## 2021-04-18 ENCOUNTER — Encounter: Payer: Self-pay | Admitting: Internal Medicine

## 2021-04-18 ENCOUNTER — Inpatient Hospital Stay (HOSPITAL_BASED_OUTPATIENT_CLINIC_OR_DEPARTMENT_OTHER): Payer: PPO | Admitting: Internal Medicine

## 2021-04-18 ENCOUNTER — Other Ambulatory Visit: Payer: Self-pay

## 2021-04-18 ENCOUNTER — Inpatient Hospital Stay: Payer: PPO

## 2021-04-18 DIAGNOSIS — C3432 Malignant neoplasm of lower lobe, left bronchus or lung: Secondary | ICD-10-CM

## 2021-04-18 DIAGNOSIS — Z5112 Encounter for antineoplastic immunotherapy: Secondary | ICD-10-CM | POA: Diagnosis not present

## 2021-04-18 LAB — COMPREHENSIVE METABOLIC PANEL
ALT: 18 U/L (ref 0–44)
AST: 16 U/L (ref 15–41)
Albumin: 4.2 g/dL (ref 3.5–5.0)
Alkaline Phosphatase: 45 U/L (ref 38–126)
Anion gap: 10 (ref 5–15)
BUN: 27 mg/dL — ABNORMAL HIGH (ref 8–23)
CO2: 25 mmol/L (ref 22–32)
Calcium: 8.9 mg/dL (ref 8.9–10.3)
Chloride: 102 mmol/L (ref 98–111)
Creatinine, Ser: 0.98 mg/dL (ref 0.61–1.24)
GFR, Estimated: >60 mL/min (ref >60)
Glucose, Bld: 124 mg/dL — ABNORMAL HIGH (ref 70–99)
Potassium: 3.9 mmol/L (ref 3.5–5.1)
Sodium: 137 mmol/L (ref 135–145)
Total Bilirubin: 0.9 mg/dL (ref 0.3–1.2)
Total Protein: 6.7 g/dL (ref 6.5–8.1)

## 2021-04-18 LAB — CBC WITH DIFFERENTIAL/PLATELET
Abs Immature Granulocytes: 0.11 10*3/uL — ABNORMAL HIGH (ref 0.00–0.07)
Basophils Absolute: 0 10*3/uL (ref 0.0–0.1)
Basophils Relative: 0 %
Eosinophils Absolute: 0.1 10*3/uL (ref 0.0–0.5)
Eosinophils Relative: 1 %
HCT: 42 % (ref 39.0–52.0)
Hemoglobin: 14.1 g/dL (ref 13.0–17.0)
Immature Granulocytes: 1 %
Lymphocytes Relative: 13 %
Lymphs Abs: 1.4 10*3/uL (ref 0.7–4.0)
MCH: 32 pg (ref 26.0–34.0)
MCHC: 33.6 g/dL (ref 30.0–36.0)
MCV: 95.5 fL (ref 80.0–100.0)
Monocytes Absolute: 0.6 10*3/uL (ref 0.1–1.0)
Monocytes Relative: 5 %
Neutro Abs: 9 10*3/uL — ABNORMAL HIGH (ref 1.7–7.7)
Neutrophils Relative %: 80 %
Platelets: 154 10*3/uL (ref 150–400)
RBC: 4.4 MIL/uL (ref 4.22–5.81)
RDW: 12.4 % (ref 11.5–15.5)
WBC: 11.3 10*3/uL — ABNORMAL HIGH (ref 4.0–10.5)
nRBC: 0 % (ref 0.0–0.2)

## 2021-04-18 MED ORDER — HEPARIN SOD (PORK) LOCK FLUSH 100 UNIT/ML IV SOLN
500.0000 [IU] | Freq: Once | INTRAVENOUS | Status: AC | PRN
  Filled 2021-04-18: qty 5

## 2021-04-18 MED ORDER — SODIUM CHLORIDE 0.9 % IV SOLN
Freq: Once | INTRAVENOUS | Status: AC
  Filled 2021-04-18: qty 250

## 2021-04-18 MED ORDER — LIDOCAINE-PRILOCAINE 2.5-2.5 % EX CREA: TOPICAL_CREAM | CUTANEOUS | 3 refills | Status: AC

## 2021-04-18 MED ORDER — HEPARIN SOD (PORK) LOCK FLUSH 100 UNIT/ML IV SOLN
INTRAVENOUS | Status: AC
  Administered 2021-04-18: 500 [IU]
  Filled 2021-04-18: qty 5

## 2021-04-18 MED ORDER — SODIUM CHLORIDE 0.9 % IV SOLN
200.0000 mg | Freq: Once | INTRAVENOUS | Status: AC
  Administered 2021-04-18: 200 mg via INTRAVENOUS
  Filled 2021-04-18: qty 8

## 2021-04-18 NOTE — Progress Notes (Signed)
[place] NOTE  Patient Care Team: Ria Bush, MD as PCP - General (Family Medicine) [name] [name], MD as PCP - Cardiology (Cardiology) [name], MD (Internal Medicine) Minna Merritts, MD as Consulting Physician (Cardiology) Pieter Partridge, DO as Consulting Physician (Neurology) Cammie Sickle, MD as Consulting Physician (Hematology and Oncology) Telford Nab, RN as Oncology Nurse Navigator  CHIEF COMPLAINTS/PURPOSE OF CONSULTATION: lung cancer    Oncology History Overview Note  #MAY 2022-Lung cancer-non-small cell [CT guided bx] T3N1 vs stage IV Dr.Hendrickson.  MRI brain negative for malignancy.# 1. April 2022- LLL ~4.0 cm mass in the superior segment left lower lobe abuts the major fissure and the posterior pleural surface without visible chest wall invasion without pleural effusion. There 2-3 other small nodules in the left lower lobe, largest measures 9 mm in diameter. These are suspicious for same lobe satellite lesions and there is left hilar adenopathy. Assuming non-small cell lung cancer the appearance is compatible with T3 N1 M0 disease (stage IIIA).  # right suprarenal nodule-awaiting biopsy on 5/31- non-small cell [revived at tumor conference at [place]  # June 9th 2022- CARBO-TAXOl-KEYTRUDA; Fulphila  MOLECULAR TESTING: NGS TPS-PDL 100%; EXON 12 amplification* mutations.    # CAD [CABG 2016; Dr.Gollan]   Cancer of lower lobe of left lung ([place])  11/02/2020 Initial Diagnosis   Cancer of lower lobe of left lung ([place])   11/02/2020 Cancer Staging   Staging form: Lung, AJCC 8th Edition - Clinical: Stage IVA (cT3, cN1, cM1a) - Signed by Cammie Sickle, MD on 11/02/2020    11/16/2020 -  Chemotherapy   Patient is on Treatment Plan : LUNG NSCLC Carboplatin + Paclitaxel + Pembrolizumab q21d x 4 cycles / Pembrolizumab Maintenance Q21D        HISTORY OF PRESENTING ILLNESS: Alone.  Walking independently. Randall Ali. 80 y.o.  male lung cancer-non-small cell stage IV [supra-renal nodule s/p biopsy] currently on Keytruda maintenance is here for a follow up.  Patient continues to be fatigued.  However much improved-post IV fluids/dexamethasone.    No nausea no vomiting.  No headaches.  Review of Systems  Constitutional:  Positive for malaise/fatigue. Negative for chills, diaphoresis, fever and weight loss.  HENT:  Negative for nosebleeds and sore throat.   Eyes:  Negative for double vision.  Respiratory:  Negative for cough, hemoptysis, sputum production, shortness of breath and wheezing.   Cardiovascular:  Negative for chest pain, palpitations, orthopnea and leg swelling.  Gastrointestinal:  Negative for abdominal pain, blood in stool, constipation, diarrhea, heartburn, melena, nausea and vomiting.  Genitourinary:  Negative for dysuria, frequency and urgency.  Musculoskeletal:  Positive for back pain and joint pain.  Skin: Negative.  Negative for itching and rash.  Neurological:  Negative for dizziness, tingling, focal weakness, weakness and headaches.  Endo/Heme/Allergies:  Does not bruise/bleed easily.  Psychiatric/Behavioral:  Negative for depression. The patient is not nervous/anxious and does not have insomnia.     MEDICAL HISTORY:  Past Medical History:  Diagnosis Date   Allergy    seasonal   Arthritis    all over- in general    CAD (coronary artery disease)    a. inferior wall MI 10/01 s/p PCI/DES to RCA; b. Myoview 3/16 neg for ischemia; c. LHC 8/16: ostLAD 80%, OM1 70%, OM2 70% x 2 lesions, mRCA 30%, dRCA 70% s/p 4-V CABG 01/24/15 (LIMA-LAD, VG- OM1, VG-OM2, VG-PDA)    Cancer (HCC)    skin, melanoma  Carotid artery disease ([place])    a. [place] 8/16: 1-39% bilateral ICA stenosis   Cataract    removed   Diastolic dysfunction    a. TTE 8/16: EF 55-60%, no RWMA, Gr1DD, calcified mitral annulus, mild biatrial enlargement   Erectile dysfunction    GERD (gastroesophageal reflux disease)     History of elbow surgery    History of hiatal hernia    HLD (hyperlipidemia)    HTN (hypertension)    Inferior myocardial infarction ([place]) 03/2000   stent RCA   Lung cancer ([place])    Postoperative wound infection 02/02/2015   Reflux esophagitis    Sleep apnea 2017   CPAP at night    SURGICAL HISTORY: Past Surgical History:  Procedure Laterality Date   arm surgery  2010   BROW LIFT Bilateral 11/25/2019   Procedure: BROW PTOSIS REPAIR BILATERAL;  Surgeon: Karle Starch, MD;  Location: [facility];  Service: Ophthalmology;  Laterality: Bilateral;  sleep apnea   CARDIAC CATHETERIZATION  06/24/2011   CARDIAC CATHETERIZATION N/A 01/18/2015   Procedure: Left Heart Cath with coronary angiography;  Surgeon: Minna Merritts, MD;  Location: [facility];  Service: Cardiovascular;  Laterality: N/A;   CARDIAC CATHETERIZATION N/A 01/18/2015   Procedure: Intravascular Pressure Wire/FFR Study;  Surgeon: [name], MD;  Location: [facility];  Service: Cardiovascular;  Laterality: N/A;   CAROTID STENT  03/10/2011   COLONOSCOPY  2010   COLONOSCOPY  06/14/2014   Dr Hilarie Fredrickson   CORONARY ARTERY BYPASS GRAFT N/A 01/24/2015   Procedure: CORONARY ARTERY BYPASS GRAFTING x 4 (LIMA-LAD, SVG-Int 1- Int 2, SVG-PD) ENDOSCOPIC GREATER SAPHENOUS VEIN HARVEST LEFT LEG;  Surgeon: [name], MD;  Location: [facility];  Service: Open Heart Surgery;  Laterality: N/A;   EMBOLECTOMY  06/15/2019   Procedure: EMBOLECTOMY;  Surgeon: Katha Cabal, MD;  Location: [facility];  Service: Vascular;;  right superficial femoral artery   ENDARTERECTOMY FEMORAL Right 06/15/2019   Procedure: ENDARTERECTOMY FEMORAL;  Surgeon: Katha Cabal, MD;  Location: [facility];  Service: Vascular;  Laterality: Right;  common femoral profunda femoris superficial femoral   ESOPHAGOGASTRODUODENOSCOPY (EGD) WITH PROPOFOL N/A 04/24/2016   Procedure: ESOPHAGOGASTRODUODENOSCOPY (EGD) WITH PROPOFOL;  Surgeon: Jerene Bears, MD;  Location: [facility];  Service: Gastroenterology;  Laterality: N/A;   EYE SURGERY     lasik 15 yrs. ago, cataracts removed - both eyes    HAMMER TOE SURGERY     right toe   INSERTION OF ILIAC STENT Right 06/15/2019   Procedure: INSERTION OF ILIAC STENT ( STENTING OF SFA/POP ARTERY );  Surgeon: Katha Cabal, MD;  Location: [facility];  Service: Vascular;  Laterality: Right;  angioplpasty and stent placement: right superficial femoral right tibiopopliteal trunk bilateral common iliac arteries   IR IMAGING GUIDED PORT INSERTION  11/09/2020   LEFT HEART CATH AND CORONARY ANGIOGRAPHY Left 06/10/2017   Procedure: LEFT HEART CATH AND CORONARY ANGIOGRAPHY;  Surgeon: Minna Merritts, MD;  Location: [facility];  Service: Cardiovascular;  Laterality: Left;   LOWER EXTREMITY ANGIOGRAPHY Left 01/04/2019   Procedure: LOWER EXTREMITY ANGIOGRAPHY;  Surgeon: Katha Cabal, MD;  Location: [facility];  Service: Cardiovascular;  Laterality: Left;   LOWER EXTREMITY ANGIOGRAPHY Right 01/25/2019   Procedure: LOWER EXTREMITY ANGIOGRAPHY;  Surgeon: Katha Cabal, MD;  Location: [facility];  Service: Cardiovascular;  Laterality: Right;   LOWER EXTREMITY ANGIOGRAPHY Right 01/15/2021   LOWER EXTREMITY ANGIOGRAPHY  and stent placement to R SFA and popliteal artery Delana Meyer, Dolores Lory, MD)   NASAL SINUS SURGERY  2008   septpolasty, bilateral turbinate reduction   SHOULDER ARTHROSCOPY  2012   TEE WITHOUT CARDIOVERSION N/A 01/24/2015   Procedure: TRANSESOPHAGEAL ECHOCARDIOGRAM (TEE);  Surgeon: [name], MD;  Location: [facility];  Service: Open Heart Surgery;  Laterality: N/A;   TOE SURGERY  1994   UPPER GI ENDOSCOPY  07/2014, 04-24-16   Dr Raquel James   WRIST SURGERY  2011    SOCIAL HISTORY: Social History   Socioeconomic History   Marital status: Single    Spouse name: Not on file   Number of children: Not on file   Years of education: 12   Highest  education level: Not on file  Occupational History   Occupation: retired    Comment: [employer]  Tobacco Use   Smoking status: Former    Packs/day: 2.50    Years: 40.00    Pack years: 100.00    Types: Cigarettes    Quit date: 03/24/2000    Years since quitting: 21.1   Smokeless tobacco: Never  Vaping Use   Vaping Use: Never used  Substance and Sexual Activity   Alcohol use: Yes    Comment: occassionally   Drug use: No   Sexual activity: Yes  Other Topics Concern   Not on file  Social History Narrative   Singled; lives with son and dog    Occ: retired, back part time at [employer];    Activity: gym 4-5d/wk   Diet: good water, fruits/vegetables daily   Caffeine Use-yes      ------------------------------------       Car sales- retd; [employer]- retd; quit smoking 2001. Alcohol couple nights a week. Live in [place]. Daughter lives 10 mins.    Social Determinants of Health   Financial Resource Strain: Not on file  Food Insecurity: Not on file  Transportation Needs: Not on file  Physical Activity: Not on file  Stress: Not on file  Social Connections: Not on file  Intimate Partner Violence: Not on file    FAMILY HISTORY: Family History  Problem Relation Age of Onset   Hypertension Mother    Heart disease Mother    Hypertension Father    Diabetes Father    Lymphoma Sister    Heart disease Brother 45   Cancer Paternal Grandfather    Colon cancer Neg Hx    Prostate cancer Neg Hx    Bladder Cancer Neg Hx    Kidney cancer Neg Hx     ALLERGIES:  is allergic to flomax [tamsulosin].  MEDICATIONS:  Current Outpatient Medications  Medication Sig Dispense Refill   acetaminophen (TYLENOL) 325 MG tablet Take 650 mg by mouth as needed for moderate pain or mild pain.     Ascorbic Acid (VITAMIN C) 1000 MG tablet Take 1,000 mg by mouth daily.     aspirin 81 MG EC tablet Take 81 mg by mouth daily.       Calcium-Magnesium-Vitamin D (CALCIUM 1200+D3 PO) Take 1 tablet by  mouth daily.     Carboxymethylcellul-Glycerin (LUBRICATING EYE DROPS OP) Place 1 drop into both eyes daily as needed (irritation).     Cholecalciferol (VITAMIN D3) 50 MCG (2000 UT) TABS Take 2,000 Units by mouth daily.     clopidogrel (PLAVIX) 75 MG tablet Take 1 tablet (75 mg total) by mouth daily. 30 tablet 11   Cyanocobalamin (B-12) 5000 MCG CAPS Take 5,000 mcg by  mouth daily.     docusate sodium (COLACE) 100 MG capsule Take 200 mg by mouth at bedtime.     erythromycin ophthalmic ointment SMARTSIG:In Eye(s)     Melatonin 10 MG CAPS Take 10 mg by mouth at bedtime as needed (sleep).     mupirocin cream (BACTROBAN) 2 % Apply 1 application topically 2 (two) times daily. 15 g 0   Niacinamide-Zn-Cu-Methfo-Se-Cr (NICOTINAMIDE PO) Take 500 mg by mouth daily.     pantoprazole (PROTONIX) 40 MG tablet TAKE 1 TABLET BY MOUTH DAILY (Patient taking differently: Take 40 mg by mouth daily.) 90 tablet 3   Polyethyl Glycol-Propyl Glycol (LUBRICANT EYE DROPS) 0.4-0.3 % SOLN Place 1-2 drops into both eyes 3 (three) times daily as needed (burning eyes.).     simvastatin (ZOCOR) 40 MG tablet TAKE 1 TABLET BY MOUTH AT BEDTIME 90 tablet 0   triamcinolone ointment (KENALOG) 0.5 %      Vitamin E 450 MG (1000 UT) CAPS Take 450 Units by mouth daily.     Zinc 50 MG TABS Take 50 mg by mouth daily.     lidocaine-prilocaine (EMLA) cream Apply 30 -45 mins prior to port access. 30 g 3   lisinopril (ZESTRIL) 5 MG tablet TAKE 1 TABLET BY MOUTH DAILY 90 tablet 0   nitroGLYCERIN (NITROSTAT) 0.4 MG SL tablet Place 1 tablet (0.4 mg total) under the tongue every 5 (five) minutes as needed for up to 25 doses for chest pain. 25 tablet 1   ondansetron (ZOFRAN) 8 MG tablet One pill every 8 hours as needed for nausea/vomitting. (Patient not taking: Reported on 05/09/2021) 40 tablet 1   predniSONE (DELTASONE) 20 MG tablet Take 1 tablet (20 mg total) by mouth daily with breakfast. Once a day with food. 40 tablet 0   prochlorperazine  (COMPAZINE) 10 MG tablet Take 1 tablet (10 mg total) by mouth every 6 (six) hours as needed for nausea or vomiting. (Patient not taking: Reported on 05/09/2021) 40 tablet 1   traMADol (ULTRAM) 50 MG tablet TAKE 1 TABLET BY MOUTH 3 TIMES A DAY AS NEEDED FOR MODERATE PAIN. 90 tablet 0   No current facility-administered medications for this visit.   Facility-Administered Medications Ordered in Other Visits  Medication Dose Route Frequency Provider Last Rate Last Admin   heparin lock flush 100 UNIT/ML injection               .  PHYSICAL EXAMINATION: ECOG PERFORMANCE STATUS: 0 - Asymptomatic  Vitals:   04/18/21 0947  BP: 137/72  Pulse: 64  Resp: 16  Temp: 97.9 F (36.6 C)  SpO2: 96%    Filed Weights   04/18/21 0947  Weight: 217 lb 1.6 oz (98.5 kg)      Physical Exam HENT:     Head: Normocephalic and atraumatic.     Mouth/Throat:     Pharynx: No oropharyngeal exudate.  Eyes:     Pupils: Pupils are equal, round, and reactive to light.  Cardiovascular:     Rate and Rhythm: Normal rate and regular rhythm.  Pulmonary:     Effort: No respiratory distress.     Breath sounds: No wheezing.     Comments: Decreased air entry bilaterally. Abdominal:     General: Bowel sounds are normal. There is no distension.     Palpations: Abdomen is soft. There is no mass.     Tenderness: There is no abdominal tenderness. There is no guarding or rebound.  Musculoskeletal:  General: No tenderness. Normal range of motion.     Cervical back: Normal range of motion and neck supple.  Skin:    General: Skin is warm.  Neurological:     Mental Status: He is alert and oriented to person, place, and time.  Psychiatric:        Mood and Affect: Affect normal.     LABORATORY DATA:  I have reviewed the data as listed Lab Results  Component Value Date   WBC 13.7 (H) 05/09/2021   HGB 14.9 05/09/2021   HCT 43.1 05/09/2021   MCV 93.7 05/09/2021   PLT 244 05/09/2021   Recent Labs     04/18/21 0917 04/25/21 0928 05/09/21 0913  NA 137 136 137  K 3.9 4.1 3.9  CL 102 106 104  CO2 25 24 22   GLUCOSE 124* 118* 94  BUN 27* 29* 25*  CREATININE 0.98 0.91 0.90  CALCIUM 8.9 8.6* 9.5  GFRNONAA >60 >60 >60  PROT 6.7 6.5 7.2  ALBUMIN 4.2 4.0 4.2  AST 16 15 17   ALT 18 16 18   ALKPHOS 45 46 54  BILITOT 0.9 0.8 0.5    RADIOGRAPHIC STUDIES: I have personally reviewed the radiological images as listed and agreed with the findings in the report. CT SOFT TISSUE NECK W CONTRAST  Result Date: 04/26/2021 CLINICAL DATA:  Restaging lung cancer EXAM: CT NECK WITH CONTRAST TECHNIQUE: Multidetector CT imaging of the neck was performed using the standard protocol following the bolus administration of intravenous contrast. CONTRAST:  158mL OMNIPAQUE IOHEXOL 350 MG/ML SOLN COMPARISON:  no prior neck CT, correlation is made with PET-CT 10/17/2020. FINDINGS: Pharynx and larynx: Evaluation of the pharynx is somewhat limited by beam hardening artifact from the patient's dental hardware. Within this limitation, the pharynx and larynx are unremarkable. No mass or swelling. Salivary glands: No inflammation, mass, or stone. Thyroid: Normal. Lymph nodes: None enlarged or abnormal density. Vascular: Negative. Limited intracranial: Negative. Visualized orbits: Postsurgical changes in the globes. Otherwise unremarkable. Mastoids and visualized paranasal sinuses: Clear. Skeleton: No acute osseous abnormality. Upper chest: For findings in the thorax, please see same day CT chest. Other: None IMPRESSION: No evidence of metastatic disease in the neck. For findings in the thorax, please see same day CT chest. Electronically Signed   By: Merilyn Baba M.D.   On: 04/26/2021 19:48   CT CHEST ABDOMEN PELVIS W CONTRAST  Result Date: 04/26/2021 CLINICAL DATA:  Non-small-cell lung cancer with right retroperitoneal metastatic lesion. EXAM: CT CHEST, ABDOMEN, AND PELVIS WITH CONTRAST TECHNIQUE: Multidetector CT imaging of  the chest, abdomen and pelvis was performed following the standard protocol during bolus administration of intravenous contrast. CONTRAST:  136mL OMNIPAQUE IOHEXOL 350 MG/ML SOLN COMPARISON:  01/14/2021 FINDINGS: CT CHEST FINDINGS Cardiovascular: The heart size is normal. No substantial pericardial effusion. Coronary artery calcification is evident. Status post CABG. Moderate atherosclerotic calcification is noted in the wall of the thoracic aorta. Right Port-A-Cath tip is positioned in the distal SVC. Mediastinum/Nodes: No mediastinal lymphadenopathy. There is no hilar lymphadenopathy. Specifically, no left hilar adenopathy on the current study. The esophagus has normal imaging features. There is no axillary lymphadenopathy. Lungs/Pleura: Centrilobular emphsyema noted. Posterior left lower lobe pulmonary lesion has decreased in the interval measuring 2.1 x 1.9 cm today compared to 3.0 x 2.2 cm previously. The irregular nodular consolidative opacity seen in the posterior right upper lobe previously has nearly resolved in the interval (see image 73/6 today). No new suspicious nodule or mass. No focal airspace consolidation.  No pleural effusion. Musculoskeletal: No worrisome lytic or sclerotic osseous abnormality. CT ABDOMEN PELVIS FINDINGS Hepatobiliary: Similar appearance scattered tiny hypodensities in the liver parenchyma, most of which are too small to characterize but likely benign. There is no evidence for gallstones, gallbladder wall thickening, or pericholecystic fluid. No intrahepatic or extrahepatic biliary dilation. Pancreas: No focal mass lesion. No dilatation of the main duct. No intraparenchymal cyst. No peripancreatic edema. Spleen: No splenomegaly. No focal mass lesion. Adrenals/Urinary Tract: No adrenal nodule or mass. Right suprarenal nodule measured previously at 2.2 x 2.0 cm is now 1.6 x 1.5 cm. Stable tiny cyst lower pole left kidney. No evidence for hydroureter. The urinary bladder appears  normal for the degree of distention. Stomach/Bowel: Stomach is decompressed. Duodenum is normally positioned as is the ligament of Treitz. No small bowel wall thickening. No small bowel dilatation. The terminal ileum is normal. The appendix is normal. No gross colonic mass. No colonic wall thickening. Large stool volume throughout. Vascular/Lymphatic: There is advanced atherosclerotic calcification of the abdominal aorta without aneurysm. There is no gastrohepatic or hepatoduodenal ligament lymphadenopathy. No retroperitoneal or mesenteric lymphadenopathy. No pelvic sidewall lymphadenopathy. Reproductive: The prostate gland and seminal vesicles are unremarkable. Other: No intraperitoneal free fluid. Musculoskeletal: Small left groin hernia contains only fat. No worrisome lytic or sclerotic osseous abnormality. IMPRESSION: 1. Interval decrease in size of the posterior left lower lobe pulmonary lesion with interval near complete resolution of the irregular nodular consolidative opacity in the posterior right upper lobe. 2. Interval decrease in size of the right suprarenal nodule. 3. No new or progressive findings on today's exam. 4. Large stool volume. Imaging features could be compatible with constipation in the appropriate clinical setting. 5. Aortic Atherosclerosis (ICD10-I70.0) and Emphysema (ICD10-J43.9). Electronically Signed   By: [name] Stanley M.D.   On: 04/26/2021 08:50     ASSESSMENT & PLAN:   Cancer of lower lobe of left lung ([place]) #Lung cancer-non-small cell stage IV [right suprarenal nodule-s/p biopsy "non-small cell ca"].  S/p  CarboTaxol plus Keytruda x4. CT AUG 5th, 2022-partial response [ subpleural mass of the superior segment left lower lobe is decreased in size. An adjacent nodule of the left lower lobe abutting the fissure is almost completely resolved;  Interval decrease in size of enlarged left hilar lymph node; Interval decrease in size of a metastatic soft tissue nodule adjacent to the  superior pole of the right kidney];   Currently on  maintenance Keytruda today.   # Proceed with [place] today. Labs today reviewed;  acceptable for treatment today- see below.  # Fatigue: No acute process - -September MRI brain negative for any pituitary hypophysitis/brain metastasis.  Patient clinical response to prednisone.  Proceed with prednisone 20 mg a day.  If CT scan shows improved/stable disease consider continue Keytruda; adding hydrocortisone.  # CAD-s/p CABG- STABLE;  # PVD-s/p stent [Dr.Schneir]-improvement in pain noted.  On Plavix -STABLE;  # IV access: Mediport functioning.  # DISPOSITION: #  Keytruda today;  # in 1 week- labs- bmp- Possible IVFs over 1 hour; dex 4 mg. # Follow up in 3 week- MD; labs- cbc/cmp; possible Keytruda; Dr.B      All  questions were answered. The patient knows to call the clinic with any problems, questions or concerns.    Cammie Sickle, MD 05/10/2021 2:54 PM

## 2021-04-18 NOTE — Patient Instructions (Signed)
[facility]   Discharge Instructions: Thank you for choosing [facility] to provide your oncology and hematology care.  If you have a lab appointment with the [facility], please go directly to the [facility] and check in at the registration area.  Wear comfortable clothing and clothing appropriate for easy access to any Portacath or PICC line.   We strive to give you quality time with your provider. You may need to reschedule your appointment if you arrive late (15 or more minutes).  Arriving late affects you and other patients whose appointments are after yours.  Also, if you miss three or more appointments without notifying the office, you may be dismissed from the clinic at the provider's discretion.      For prescription refill requests, have your pharmacy contact our office and allow 72 hours for refills to be completed.    Today you received the following chemotherapy and/or immunotherapy agents - Keytruda      To help prevent nausea and vomiting after your treatment, we encourage you to take your nausea medication as directed.  BELOW ARE SYMPTOMS THAT SHOULD BE REPORTED IMMEDIATELY: *FEVER GREATER THAN 100.4 F (38 C) OR HIGHER *CHILLS OR SWEATING *NAUSEA AND VOMITING THAT IS NOT CONTROLLED WITH YOUR NAUSEA MEDICATION *UNUSUAL SHORTNESS OF BREATH *UNUSUAL BRUISING OR BLEEDING *URINARY PROBLEMS (pain or burning when urinating, or frequent urination) *BOWEL PROBLEMS (unusual diarrhea, constipation, pain near the anus) TENDERNESS IN MOUTH AND THROAT WITH OR WITHOUT PRESENCE OF ULCERS (sore throat, sores in mouth, or a toothache) UNUSUAL RASH, SWELLING OR PAIN  UNUSUAL VAGINAL DISCHARGE OR ITCHING   Items with * indicate a potential emergency and should be followed up as soon as possible or go to the Emergency Department if any problems should occur.  Please show the CHEMOTHERAPY ALERT CARD or IMMUNOTHERAPY ALERT CARD at  check-in to the Emergency Department and triage nurse.  Should you have questions after your visit or need to cancel or reschedule your appointment, please contact [place]  [phone] and follow the prompts.  Office hours are 8:00 a.m. to 4:30 p.m. Monday - Friday. Please note that voicemails left after 4:00 p.m. may not be returned until the following business day.  We are closed weekends and major holidays. You have access to a nurse at all times for urgent questions. Please call the main number to the clinic [phone] and follow the prompts.  For any non-urgent questions, you may also contact your provider using MyChart. We now offer e-Visits for anyone 36 and older to request care online for non-urgent symptoms. For details visit [email or web address].   Also download the MyChart app! Go to the app store, search "MyChart", open the app, select [facility], and log in with your MyChart username and password.  Due to Covid, a mask is required upon entering the hospital/clinic. If you do not have a mask, one will be given to you upon arrival. For doctor visits, patients may have 1 support person aged 93 or older with them. For treatment visits, patients cannot have anyone with them due to current Covid guidelines and our immunocompromised population.   Pembrolizumab injection What is this medication? PEMBROLIZUMAB (pem broe liz ue mab) is a monoclonal antibody. It is used to treat certain types of cancer. This medicine may be used for other purposes; ask your health care provider or pharmacist if you have questions. COMMON BRAND NAME(S): Keytruda What should I tell my  care team before I take this medication? They need to know if you have any of these conditions: autoimmune diseases like Crohn's disease, ulcerative colitis, or lupus have had or planning to have an allogeneic stem cell transplant (uses someone else's stem cells) history of organ  transplant history of chest radiation nervous system problems like myasthenia gravis or Guillain-Barre syndrome an unusual or allergic reaction to pembrolizumab, other medicines, foods, dyes, or preservatives pregnant or trying to get pregnant breast-feeding How should I use this medication? This medicine is for infusion into a vein. It is given by a health care professional in a hospital or clinic setting. A special MedGuide will be given to you before each treatment. Be sure to read this information carefully each time. Talk to your pediatrician regarding the use of this medicine in children. While this drug may be prescribed for children as young as 6 months for selected conditions, precautions do apply. Overdosage: If you think you have taken too much of this medicine contact a poison control center or emergency room at once. NOTE: This medicine is only for you. Do not share this medicine with others. What if I miss a dose? It is important not to miss your dose. Call your doctor or health care professional if you are unable to keep an appointment. What may interact with this medication? Interactions have not been studied. This list may not describe all possible interactions. Give your health care provider a list of all the medicines, herbs, non-prescription drugs, or dietary supplements you use. Also tell them if you smoke, drink alcohol, or use illegal drugs. Some items may interact with your medicine. What should I watch for while using this medication? Your condition will be monitored carefully while you are receiving this medicine. You may need blood work done while you are taking this medicine. Do not become pregnant while taking this medicine or for 4 months after stopping it. Women should inform their doctor if they wish to become pregnant or think they might be pregnant. There is a potential for serious side effects to an unborn child. Talk to your health care professional or  pharmacist for more information. Do not breast-feed an infant while taking this medicine or for 4 months after the last dose. What side effects may I notice from receiving this medication? Side effects that you should report to your doctor or health care professional as soon as possible: allergic reactions like skin rash, itching or hives, swelling of the face, lips, or tongue bloody or black, tarry breathing problems changes in vision chest pain chills confusion constipation cough diarrhea dizziness or feeling faint or lightheaded fast or irregular heartbeat fever flushing joint pain low blood counts - this medicine may decrease the number of Henrique Parekh blood cells, red blood cells and platelets. You may be at increased risk for infections and bleeding. muscle pain muscle weakness pain, tingling, numbness in the hands or feet persistent headache redness, blistering, peeling or loosening of the skin, including inside the mouth signs and symptoms of high blood sugar such as dizziness; dry mouth; dry skin; fruity breath; nausea; stomach pain; increased hunger or thirst; increased urination signs and symptoms of kidney injury like trouble passing urine or change in the amount of urine signs and symptoms of liver injury like dark urine, light-colored stools, loss of appetite, nausea, right upper belly pain, yellowing of the eyes or skin sweating swollen lymph nodes weight loss Side effects that usually do not require medical attention (report to your  doctor or health care professional if they continue or are bothersome): decreased appetite hair loss tiredness This list may not describe all possible side effects. Call your doctor for medical advice about side effects. You may report side effects to FDA at 1-800-FDA-1088. Where should I keep my medication? This drug is given in a hospital or clinic and will not be stored at home. NOTE: This sheet is a summary. It may not cover all possible  information. If you have questions about this medicine, talk to your doctor, pharmacist, or health care provider.  2022 Elsevier/Gold Standard (2021-02-12 00:00:00)

## 2021-04-18 NOTE — Assessment & Plan Note (Addendum)
#  Lung cancer-non-small cell stage IV [right suprarenal nodule-s/p biopsy "non-small cell ca"].  S/p  CarboTaxol plus Keytruda x4. CT AUG 5th, 2022-partial response [ subpleural mass of the superior segment left lower lobe is decreased in size. An adjacent nodule of the left lower lobe abutting the fissure is almost completely resolved;  Interval decrease in size of enlarged left hilar lymph node; Interval decrease in size of a metastatic soft tissue nodule adjacent to the superior pole of the right kidney];   Currently on  maintenance Keytruda today.   # Proceed with [place] today. Labs today reviewed;  acceptable for treatment today- see below.  # Fatigue: No acute process - -September MRI brain negative for any pituitary hypophysitis/brain metastasis.  Patient clinical response to prednisone.  Proceed with prednisone 20 mg a day.  If CT scan shows improved/stable disease consider continue Keytruda; adding hydrocortisone.  # CAD-s/p CABG- STABLE;  # PVD-s/p stent [Dr.Schneir]-improvement in pain noted.  On Plavix -STABLE;  # IV access: Mediport functioning.  # DISPOSITION: #  Keytruda today;  # in 1 week- labs- bmp- Possible IVFs over 1 hour; dex 4 mg. # Follow up in 3 week- MD; labs- cbc/cmp; possible Keytruda; Dr.B

## 2021-04-18 NOTE — Progress Notes (Signed)
Pt received keytruda infusion in clinic today. Tolerated well. No complaints at d/c.

## 2021-04-18 NOTE — Patient Instructions (Signed)
#  Take prednisone half a pill a day-until you finish it off.

## 2021-04-22 ENCOUNTER — Other Ambulatory Visit: Payer: Self-pay

## 2021-04-22 ENCOUNTER — Encounter: Payer: Self-pay | Admitting: Physician Assistant

## 2021-04-22 ENCOUNTER — Ambulatory Visit: Payer: PPO | Admitting: Physician Assistant

## 2021-04-22 ENCOUNTER — Other Ambulatory Visit: Payer: Self-pay | Admitting: Family Medicine

## 2021-04-22 VITALS — BP 122/72 | HR 66 | Ht 68.0 in | Wt 216.0 lb

## 2021-04-22 DIAGNOSIS — I4891 Unspecified atrial fibrillation: Secondary | ICD-10-CM | POA: Diagnosis not present

## 2021-04-22 DIAGNOSIS — I9789 Other postprocedural complications and disorders of the circulatory system, not elsewhere classified: Secondary | ICD-10-CM | POA: Diagnosis not present

## 2021-04-22 DIAGNOSIS — I739 Peripheral vascular disease, unspecified: Secondary | ICD-10-CM | POA: Diagnosis not present

## 2021-04-22 DIAGNOSIS — C3492 Malignant neoplasm of unspecified part of left bronchus or lung: Secondary | ICD-10-CM

## 2021-04-22 DIAGNOSIS — E785 Hyperlipidemia, unspecified: Secondary | ICD-10-CM | POA: Diagnosis not present

## 2021-04-22 DIAGNOSIS — Z951 Presence of aortocoronary bypass graft: Secondary | ICD-10-CM

## 2021-04-22 DIAGNOSIS — R0600 Dyspnea, unspecified: Secondary | ICD-10-CM

## 2021-04-22 DIAGNOSIS — I2510 Atherosclerotic heart disease of native coronary artery without angina pectoris: Secondary | ICD-10-CM

## 2021-04-22 DIAGNOSIS — I10 Essential (primary) hypertension: Secondary | ICD-10-CM

## 2021-04-22 NOTE — Patient Instructions (Signed)
Medication Instructions:  No changes at this time.  *If you need a refill on your cardiac medications before your next appointment, please call your pharmacy*   Lab Work: None  If you have labs (blood work) drawn today and your tests are completely normal, you will receive your results only by: [place] (if you have MyChart) OR A paper copy in the mail If you have any lab test that is abnormal or we need to change your treatment, we will call you to review the results.   Testing/Procedures: Your physician has requested that you have an echocardiogram.   Echocardiography is a painless test that uses sound waves to create images of your heart. It provides your doctor with information about the size and shape of your heart and how well your heart's chambers and valves are working. This procedure takes approximately one hour. There are no restrictions for this procedure.    Follow-Up: At [facility], you and your health needs are our priority.  As part of our continuing mission to provide you with exceptional heart care, we have created designated Provider Care Teams.  These Care Teams include your primary Cardiologist (physician) and Advanced Practice Providers (APPs -  Physician Assistants and Nurse Practitioners) who all work together to provide you with the care you need, when you need it.   Your next appointment:   6 month(s)  The format for your next appointment:   In Person  Provider:   [name], MD or Christell Faith, PA-C

## 2021-04-22 NOTE — Telephone Encounter (Signed)
E-scribed refill.  Plz schedule AWV, lab and cpe visits.

## 2021-04-23 NOTE — Telephone Encounter (Signed)
Pt stated that he will make apt when we get back at [facility]

## 2021-04-25 ENCOUNTER — Ambulatory Visit
Admission: RE | Admit: 2021-04-25 | Discharge: 2021-04-25 | Disposition: A | Discharge status: Home / Self Care | Payer: PPO | Source: Ambulatory Visit | Attending: Internal Medicine | Admitting: Internal Medicine

## 2021-04-25 ENCOUNTER — Inpatient Hospital Stay: Payer: PPO

## 2021-04-25 ENCOUNTER — Other Ambulatory Visit: Payer: PPO

## 2021-04-25 ENCOUNTER — Ambulatory Visit: Payer: PPO

## 2021-04-25 ENCOUNTER — Other Ambulatory Visit: Payer: Self-pay

## 2021-04-25 VITALS — BP 133/59 | HR 72 | Temp 96.7°F | Resp 17 | Wt 216.0 lb

## 2021-04-25 DIAGNOSIS — N281 Cyst of kidney, acquired: Secondary | ICD-10-CM | POA: Diagnosis not present

## 2021-04-25 DIAGNOSIS — C3490 Malignant neoplasm of unspecified part of unspecified bronchus or lung: Secondary | ICD-10-CM | POA: Diagnosis not present

## 2021-04-25 DIAGNOSIS — K7689 Other specified diseases of liver: Secondary | ICD-10-CM | POA: Diagnosis not present

## 2021-04-25 DIAGNOSIS — C3432 Malignant neoplasm of lower lobe, left bronchus or lung: Secondary | ICD-10-CM | POA: Insufficient documentation

## 2021-04-25 DIAGNOSIS — C786 Secondary malignant neoplasm of retroperitoneum and peritoneum: Secondary | ICD-10-CM | POA: Diagnosis not present

## 2021-04-25 DIAGNOSIS — K4090 Unilateral inguinal hernia, without obstruction or gangrene, not specified as recurrent: Secondary | ICD-10-CM | POA: Diagnosis not present

## 2021-04-25 DIAGNOSIS — I700 Atherosclerosis of aorta: Secondary | ICD-10-CM | POA: Diagnosis not present

## 2021-04-25 DIAGNOSIS — I2510 Atherosclerotic heart disease of native coronary artery without angina pectoris: Secondary | ICD-10-CM | POA: Diagnosis not present

## 2021-04-25 DIAGNOSIS — J439 Emphysema, unspecified: Secondary | ICD-10-CM | POA: Diagnosis not present

## 2021-04-25 DIAGNOSIS — Z9889 Other specified postprocedural states: Secondary | ICD-10-CM | POA: Diagnosis not present

## 2021-04-25 LAB — CBC WITH DIFFERENTIAL/PLATELET
Abs Immature Granulocytes: 0.06 10*3/uL (ref 0.00–0.07)
Basophils Absolute: 0.1 10*3/uL (ref 0.0–0.1)
Basophils Relative: 1 %
Eosinophils Absolute: 0.3 10*3/uL (ref 0.0–0.5)
Eosinophils Relative: 2 %
HCT: 41.7 % (ref 39.0–52.0)
Hemoglobin: 13.9 g/dL (ref 13.0–17.0)
Immature Granulocytes: 0 %
Lymphocytes Relative: 6 %
Lymphs Abs: 0.9 10*3/uL (ref 0.7–4.0)
MCH: 32 pg (ref 26.0–34.0)
MCHC: 33.3 g/dL (ref 30.0–36.0)
MCV: 96.1 fL (ref 80.0–100.0)
Monocytes Absolute: 1.2 10*3/uL — ABNORMAL HIGH (ref 0.1–1.0)
Monocytes Relative: 9 %
Neutro Abs: 10.9 10*3/uL — ABNORMAL HIGH (ref 1.7–7.7)
Neutrophils Relative %: 82 %
Platelets: 164 10*3/uL (ref 150–400)
RBC: 4.34 MIL/uL (ref 4.22–5.81)
RDW: 12.6 % (ref 11.5–15.5)
WBC: 13.4 10*3/uL — ABNORMAL HIGH (ref 4.0–10.5)
nRBC: 0 % (ref 0.0–0.2)

## 2021-04-25 LAB — COMPREHENSIVE METABOLIC PANEL
ALT: 16 U/L (ref 0–44)
AST: 15 U/L (ref 15–41)
Albumin: 4 g/dL (ref 3.5–5.0)
Alkaline Phosphatase: 46 U/L (ref 38–126)
Anion gap: 6 (ref 5–15)
BUN: 29 mg/dL — ABNORMAL HIGH (ref 8–23)
CO2: 24 mmol/L (ref 22–32)
Calcium: 8.6 mg/dL — ABNORMAL LOW (ref 8.9–10.3)
Chloride: 106 mmol/L (ref 98–111)
Creatinine, Ser: 0.91 mg/dL (ref 0.61–1.24)
GFR, Estimated: >60 mL/min (ref >60)
Glucose, Bld: 118 mg/dL — ABNORMAL HIGH (ref 70–99)
Potassium: 4.1 mmol/L (ref 3.5–5.1)
Sodium: 136 mmol/L (ref 135–145)
Total Bilirubin: 0.8 mg/dL (ref 0.3–1.2)
Total Protein: 6.5 g/dL (ref 6.5–8.1)

## 2021-04-25 MED ORDER — IOHEXOL 350 MG/ML SOLN
100.0000 mL | Freq: Once | INTRAVENOUS | Status: AC | PRN
  Administered 2021-04-25: 14:00:00 100 mL via INTRAVENOUS

## 2021-04-25 MED ORDER — SODIUM CHLORIDE 0.9 % IV SOLN
Freq: Once | INTRAVENOUS | Status: AC
  Filled 2021-04-25: qty 250

## 2021-04-25 MED ORDER — HEPARIN SOD (PORK) LOCK FLUSH 100 UNIT/ML IV SOLN
500.0000 [IU] | Freq: Once | INTRAVENOUS | Status: AC
  Administered 2021-04-25: 11:00:00 500 [IU] via INTRAVENOUS
  Filled 2021-04-25: qty 5

## 2021-04-25 MED ORDER — SODIUM CHLORIDE 0.9% FLUSH
10.0000 mL | Freq: Once | INTRAVENOUS | Status: AC
  Administered 2021-04-25: 10:00:00 10 mL via INTRAVENOUS
  Filled 2021-04-25: qty 10

## 2021-04-25 MED ORDER — DEXAMETHASONE SODIUM PHOSPHATE 10 MG/ML IJ SOLN
10.0000 mg | Freq: Once | INTRAMUSCULAR | Status: AC
  Administered 2021-04-25: 10:00:00 10 mg via INTRAVENOUS
  Filled 2021-04-25: qty 1

## 2021-04-25 NOTE — Patient Instructions (Signed)
Dexamethasone injection What is this medication? DEXAMETHASONE (dex a METH a sone) is a corticosteroid. It is used to treat inflammation of the skin, joints, lungs, and other organs. Common conditions treated include asthma, allergies, and arthritis. It is also used for other conditions, like blood disorders and diseases of the adrenal glands. This medicine may be used for other purposes; ask your health care provider or pharmacist if you have questions. COMMON BRAND NAME(S): Decadron, DoubleDex, ReadySharp Dexamethasone, Simplist Dexamethasone, Solurex What should I tell my care team before I take this medication? They need to know if you have any of these conditions: Cushing's syndrome diabetes glaucoma heart disease high blood pressure infection like herpes, measles, tuberculosis, or chickenpox kidney disease liver disease mental illness myasthenia gravis osteoporosis previous heart attack seizures stomach or intestine problems thyroid disease an unusual or allergic reaction to dexamethasone, corticosteroids, other medicines, lactose, foods, dyes, or preservatives pregnant or trying to get pregnant breast-feeding How should I use this medication? This medicine is for injection into a muscle, joint, lesion, soft tissue, or vein. It is given by a health care professional in a hospital or clinic setting. Talk to your pediatrician regarding the use of this medicine in children. Special care may be needed. Overdosage: If you think you have taken too much of this medicine contact a poison control center or emergency room at once. NOTE: This medicine is only for you. Do not share this medicine with others. What if I miss a dose? This may not apply. If you are having a series of injections over a prolonged period, try not to miss an appointment. Call your doctor or health care professional to reschedule if you are unable to keep an appointment. What may interact with this medication? Do  not take this medicine with any of the following medications: live virus vaccines This medicine may also interact with the following medications: aminoglutethimide amphotericin B aspirin and aspirin-like medicines certain antibiotics like erythromycin, clarithromycin, and troleandomycin certain antivirals for HIV or hepatitis certain medicines for seizures like carbamazepine, phenobarbital, phenytoin certain medicines to treat myasthenia gravis cholestyramine cyclosporine digoxin diuretics ephedrine male hormones, like estrogen or progestins and birth control pills insulin or other medicines for diabetes isoniazid ketoconazole medicines that relax muscles for surgery mifepristone NSAIDs, medicines for pain and inflammation, like ibuprofen or naproxen rifampin skin tests for allergies thalidomide vaccines warfarin This list may not describe all possible interactions. Give your health care provider a list of all the medicines, herbs, non-prescription drugs, or dietary supplements you use. Also tell them if you smoke, drink alcohol, or use illegal drugs. Some items may interact with your medicine. What should I watch for while using this medication? Visit your health care professional for regular checks on your progress. Tell your health care professional if your symptoms do not start to get better or if they get worse. Your condition will be monitored carefully while you are receiving this medicine. Wear a medical ID bracelet or chain. Carry a card that describes your disease and details of your medicine and dosage times. This medicine may increase your risk of getting an infection. Call your health care professional for advice if you get a fever, chills, or sore throat, or other symptoms of a cold or flu. Do not treat yourself. Try to avoid being around people who are sick. Call your health care professional if you are around anyone with measles, chickenpox, or if you develop sores or  blisters that do not heal properly. If you  are going to need surgery or other procedures, tell your doctor or health care professional that you have taken this medicine within the last 12 months. Ask your doctor or health care professional about your diet. You may need to lower the amount of salt you eat. This medicine may increase blood sugar. Ask your healthcare provider if changes in diet or medicines are needed if you have diabetes. What side effects may I notice from receiving this medication? Side effects that you should report to your doctor or health care professional as soon as possible: allergic reactions like skin rash, itching or hives, swelling of the face, lips, or tongue bloody or black, tarry stools changes in emotions or moods changes in vision confusion, excitement, restlessness depressed mood eye pain hallucinations muscle weakness severe or sudden stomach or belly pain signs and symptoms of high blood sugar such as being more thirsty or hungry or having to urinate more than normal. You may also feel very tired or have blurry vision. signs and symptoms of infection like fever; chills; cough; sore throat; pain or trouble passing urine swelling of ankles, feet unusual bruising or bleeding wounds that do not heal Side effects that usually do not require medical attention (report to your doctor or health care professional if they continue or are bothersome): increased appetite increased growth of face or body hair headache nausea, vomiting pain, redness, or irritation at site where injected skin problems, acne, thin and shiny skin trouble sleeping weight gain This list may not describe all possible side effects. Call your doctor for medical advice about side effects. You may report side effects to FDA at 1-800-FDA-1088. Where should I keep my medication? This medicine is given in a hospital or clinic and will not be stored at home. NOTE: This sheet is a summary. It may  not cover all possible information. If you have questions about this medicine, talk to your doctor, pharmacist, or health care provider.  2022 Elsevier/Gold Standard (2018-12-09 00:00:00)

## 2021-04-26 ENCOUNTER — Telehealth: Payer: Self-pay | Admitting: Internal Medicine

## 2021-04-26 ENCOUNTER — Inpatient Hospital Stay: Payer: PPO

## 2021-04-26 NOTE — Telephone Encounter (Signed)
I spoke to patient regarding the results of CT scan-partial response noted.  Follow-up as planned.  Recommend Mucinex/Claritin for URI.

## 2021-04-30 ENCOUNTER — Telehealth: Payer: Self-pay | Admitting: *Deleted

## 2021-04-30 NOTE — Telephone Encounter (Signed)
Patient called reporting that his b/p was up to 180/90 this morning, but has since come down. He is complaining of a headache for 5 days and states he thinks he had the flu over the weekend, but did not get checked. He states he has congestion. He is asking for something to be done for the headaches, stating he has been taking Advil, but it does not completely get rid of it. Please advise

## 2021-04-30 NOTE — Telephone Encounter (Signed)
Call returned tro patient and advised of NP response. Patient agreed to call Cardiologist and will take Tylenol and increase fluids

## 2021-05-06 DIAGNOSIS — M9905 Segmental and somatic dysfunction of pelvic region: Secondary | ICD-10-CM | POA: Diagnosis not present

## 2021-05-06 DIAGNOSIS — M955 Acquired deformity of pelvis: Secondary | ICD-10-CM | POA: Diagnosis not present

## 2021-05-06 DIAGNOSIS — M9903 Segmental and somatic dysfunction of lumbar region: Secondary | ICD-10-CM | POA: Diagnosis not present

## 2021-05-06 DIAGNOSIS — M62830 Muscle spasm of back: Secondary | ICD-10-CM | POA: Diagnosis not present

## 2021-05-09 ENCOUNTER — Inpatient Hospital Stay: Payer: PPO

## 2021-05-09 ENCOUNTER — Encounter: Payer: Self-pay | Admitting: Internal Medicine

## 2021-05-09 ENCOUNTER — Inpatient Hospital Stay (HOSPITAL_BASED_OUTPATIENT_CLINIC_OR_DEPARTMENT_OTHER): Payer: PPO | Admitting: Internal Medicine

## 2021-05-09 ENCOUNTER — Inpatient Hospital Stay: Payer: PPO | Attending: Internal Medicine

## 2021-05-09 ENCOUNTER — Other Ambulatory Visit: Payer: Self-pay

## 2021-05-09 ENCOUNTER — Other Ambulatory Visit: Payer: Self-pay | Admitting: Family Medicine

## 2021-05-09 DIAGNOSIS — I2510 Atherosclerotic heart disease of native coronary artery without angina pectoris: Secondary | ICD-10-CM | POA: Insufficient documentation

## 2021-05-09 DIAGNOSIS — C3432 Malignant neoplasm of lower lobe, left bronchus or lung: Secondary | ICD-10-CM | POA: Insufficient documentation

## 2021-05-09 DIAGNOSIS — C786 Secondary malignant neoplasm of retroperitoneum and peritoneum: Secondary | ICD-10-CM | POA: Insufficient documentation

## 2021-05-09 DIAGNOSIS — E2740 Unspecified adrenocortical insufficiency: Secondary | ICD-10-CM | POA: Diagnosis not present

## 2021-05-09 DIAGNOSIS — Z87891 Personal history of nicotine dependence: Secondary | ICD-10-CM | POA: Diagnosis not present

## 2021-05-09 DIAGNOSIS — Z7902 Long term (current) use of antithrombotics/antiplatelets: Secondary | ICD-10-CM | POA: Insufficient documentation

## 2021-05-09 DIAGNOSIS — Z79899 Other long term (current) drug therapy: Secondary | ICD-10-CM | POA: Insufficient documentation

## 2021-05-09 DIAGNOSIS — R5383 Other fatigue: Secondary | ICD-10-CM | POA: Insufficient documentation

## 2021-05-09 DIAGNOSIS — Z955 Presence of coronary angioplasty implant and graft: Secondary | ICD-10-CM | POA: Insufficient documentation

## 2021-05-09 DIAGNOSIS — Z8774 Personal history of (corrected) congenital malformations of heart and circulatory system: Secondary | ICD-10-CM | POA: Diagnosis not present

## 2021-05-09 DIAGNOSIS — Z7982 Long term (current) use of aspirin: Secondary | ICD-10-CM | POA: Insufficient documentation

## 2021-05-09 DIAGNOSIS — Z7952 Long term (current) use of systemic steroids: Secondary | ICD-10-CM | POA: Insufficient documentation

## 2021-05-09 LAB — COMPREHENSIVE METABOLIC PANEL
ALT: 18 U/L (ref 0–44)
AST: 17 U/L (ref 15–41)
Albumin: 4.2 g/dL (ref 3.5–5.0)
Alkaline Phosphatase: 54 U/L (ref 38–126)
Anion gap: 11 (ref 5–15)
BUN: 25 mg/dL — ABNORMAL HIGH (ref 8–23)
CO2: 22 mmol/L (ref 22–32)
Calcium: 9.5 mg/dL (ref 8.9–10.3)
Chloride: 104 mmol/L (ref 98–111)
Creatinine, Ser: 0.9 mg/dL (ref 0.61–1.24)
GFR, Estimated: >60 mL/min (ref >60)
Glucose, Bld: 94 mg/dL (ref 70–99)
Potassium: 3.9 mmol/L (ref 3.5–5.1)
Sodium: 137 mmol/L (ref 135–145)
Total Bilirubin: 0.5 mg/dL (ref 0.3–1.2)
Total Protein: 7.2 g/dL (ref 6.5–8.1)

## 2021-05-09 LAB — CBC WITH DIFFERENTIAL/PLATELET
Abs Immature Granulocytes: 0.09 10*3/uL — ABNORMAL HIGH (ref 0.00–0.07)
Basophils Absolute: 0.1 10*3/uL (ref 0.0–0.1)
Basophils Relative: 0 %
Eosinophils Absolute: 0.4 10*3/uL (ref 0.0–0.5)
Eosinophils Relative: 3 %
HCT: 43.1 % (ref 39.0–52.0)
Hemoglobin: 14.9 g/dL (ref 13.0–17.0)
Immature Granulocytes: 1 %
Lymphocytes Relative: 11 %
Lymphs Abs: 1.5 10*3/uL (ref 0.7–4.0)
MCH: 32.4 pg (ref 26.0–34.0)
MCHC: 34.6 g/dL (ref 30.0–36.0)
MCV: 93.7 fL (ref 80.0–100.0)
Monocytes Absolute: 0.9 10*3/uL (ref 0.1–1.0)
Monocytes Relative: 7 %
Neutro Abs: 10.7 10*3/uL — ABNORMAL HIGH (ref 1.7–7.7)
Neutrophils Relative %: 78 %
Platelets: 244 10*3/uL (ref 150–400)
RBC: 4.6 MIL/uL (ref 4.22–5.81)
RDW: 12.1 % (ref 11.5–15.5)
WBC: 13.7 10*3/uL — ABNORMAL HIGH (ref 4.0–10.5)
nRBC: 0 % (ref 0.0–0.2)

## 2021-05-09 MED ORDER — HEPARIN SOD (PORK) LOCK FLUSH 100 UNIT/ML IV SOLN
500.0000 [IU] | Freq: Once | INTRAVENOUS | Status: AC
  Administered 2021-05-09: 500 [IU] via INTRAVENOUS
  Filled 2021-05-09: qty 5

## 2021-05-09 MED ORDER — PREDNISONE 20 MG PO TABS: 20.0000 mg | ORAL_TABLET | Freq: Every day | ORAL | 0 refills | Status: DC

## 2021-05-09 MED ORDER — SODIUM CHLORIDE 0.9% FLUSH
10.0000 mL | Freq: Once | INTRAVENOUS | Status: AC
  Administered 2021-05-09: 10 mL via INTRAVENOUS
  Filled 2021-05-09: qty 10

## 2021-05-09 MED ORDER — DEXAMETHASONE SODIUM PHOSPHATE 10 MG/ML IJ SOLN
4.0000 mg | Freq: Once | INTRAMUSCULAR | Status: AC
  Administered 2021-05-09: 4 mg via INTRAVENOUS
  Filled 2021-05-09: qty 1

## 2021-05-09 MED ORDER — HEPARIN SOD (PORK) LOCK FLUSH 100 UNIT/ML IV SOLN
INTRAVENOUS | Status: AC
  Filled 2021-05-09: qty 5

## 2021-05-09 MED ORDER — SODIUM CHLORIDE 0.9 % IV SOLN
Freq: Once | INTRAVENOUS | Status: AC
  Filled 2021-05-09: qty 250

## 2021-05-09 NOTE — Telephone Encounter (Signed)
Name of Medication: Tramadol Name of Pharmacy: [facility] or Written Date and Quantity: 03/11/21, #90 Last Office Visit and Type: 10/02/20, 6 mo f/u Next Office Visit and Type: none Last Controlled Substance Agreement Date: none Last UDS: 01/18/16

## 2021-05-09 NOTE — Assessment & Plan Note (Addendum)
#  Lung cancer-non-small cell stage IV [right suprarenal nodule-s/p biopsy "non-small cell ca"].  S/p  CarboTaxol plus Keytruda x4 CT NOV 17th, 2022- Interval decrease in size of the posterior left lower lobe pulmonary lesion with interval near complete resolution of the irregular nodular consolidative opacity in the posterior right upper lobe; Interval decrease in size of the right suprarenal nodule.  # HOLD keytruda today. Labs today reviewed; sec to fatigue- see below. Given the stability disease- and on going fatigue below.   # Fatigue: No acute process - -September MRI brain negative for any pituitary hypophysitis/brain metastasis. Proceed with prednisone 20 mg a day. Subtle adrenal insufficiency-see below]; HOLD [place]. Dex 4 mg IVP x1 today; and the IVFS today.   # CAD-s/p CABG-/PVD-s/p stent [Dr.Schneir]-improvement in pain noted.  On Plavix --STABLE;  # IV access: Mediport functioning.  # DISPOSITION: #  HOLD Keytruda today;  Today- IVFs over 1 hour; dex 4 mg. # Follow up in 1st week of JAN- MD; labs- cbc/cmp;TSH-  possible Keytruda; Dr.B  # I reviewed the blood work- with the patient in detail; also reviewed the imaging independently [as summarized above]; and with the patient in detail.

## 2021-05-09 NOTE — Progress Notes (Signed)
Pt in for follow up, states he has not felt well since Tuesday of this week.  States increased fatigue and "just don't feel like doing anything".

## 2021-05-09 NOTE — Progress Notes (Signed)
[facility] NOTE  Patient Care Team: Ria Bush, MD as PCP - General (Family Medicine) [name] [name], MD as PCP - Cardiology (Cardiology) [name], MD (Internal Medicine) Minna Merritts, MD as Consulting Physician (Cardiology) Pieter Partridge, DO as Consulting Physician (Neurology) Cammie Sickle, MD as Consulting Physician (Hematology and Oncology) Telford Nab, RN as Oncology Nurse Navigator  CHIEF COMPLAINTS/PURPOSE OF CONSULTATION: lung cancer    Oncology History Overview Note  #MAY 2022-Lung cancer-non-small cell [CT guided bx] T3N1 vs stage IV Dr.Hendrickson.  MRI brain negative for malignancy.# 1. April 2022- LLL ~4.0 cm mass in the superior segment left lower lobe abuts the major fissure and the posterior pleural surface without visible chest wall invasion without pleural effusion. There 2-3 other small nodules in the left lower lobe, largest measures 9 mm in diameter. These are suspicious for same lobe satellite lesions and there is left hilar adenopathy. Assuming non-small cell lung cancer the appearance is compatible with T3 N1 M0 disease (stage IIIA).  # right suprarenal nodule-awaiting biopsy on 5/31- non-small cell [revived at tumor conference at [place]  # June 9th 2022- CARBO-TAXOl-KEYTRUDA; Fulphila  MOLECULAR TESTING: NGS TPS-PDL 100%; EXON 12 amplification* mutations.    # CAD [CABG 2016; Dr.Gollan]   Cancer of lower lobe of left lung ([place])  11/02/2020 Initial Diagnosis   Cancer of lower lobe of left lung ([place])   11/02/2020 Cancer Staging   Staging form: Lung, AJCC 8th Edition - Clinical: Stage IVA (cT3, cN1, cM1a) - Signed by Cammie Sickle, MD on 11/02/2020    11/16/2020 -  Chemotherapy   Patient is on Treatment Plan : LUNG NSCLC Carboplatin + Paclitaxel + Pembrolizumab q21d x 4 cycles / Pembrolizumab Maintenance Q21D        HISTORY OF PRESENTING ILLNESS: Alone.  Walking independently. Randall Ali. 80 y.o.  male lung cancer-non-small cell stage IV [supra-renal nodule s/p biopsy] currently on Keytruda is here to results of the CT scan.  Patient complains of worsening fatigue in the last 4 days.  Patient previously on prednisone; started about 5 days ago.  No nausea no vomiting.  No headaches.  Review of Systems  Constitutional:  Positive for malaise/fatigue. Negative for chills, diaphoresis, fever and weight loss.  HENT:  Negative for nosebleeds and sore throat.   Eyes:  Negative for double vision.  Respiratory:  Negative for cough, hemoptysis, sputum production, shortness of breath and wheezing.   Cardiovascular:  Negative for chest pain, palpitations, orthopnea and leg swelling.  Gastrointestinal:  Negative for abdominal pain, blood in stool, constipation, diarrhea, heartburn, melena, nausea and vomiting.  Genitourinary:  Negative for dysuria, frequency and urgency.  Musculoskeletal:  Positive for back pain and joint pain.  Skin: Negative.  Negative for itching and rash.  Neurological:  Negative for dizziness, tingling, focal weakness, weakness and headaches.  Endo/Heme/Allergies:  Does not bruise/bleed easily.  Psychiatric/Behavioral:  Negative for depression. The patient is not nervous/anxious and does not have insomnia.     MEDICAL HISTORY:  Past Medical History:  Diagnosis Date   Allergy    seasonal   Arthritis    all over- in general    CAD (coronary artery disease)    a. inferior wall MI 10/01 s/p PCI/DES to RCA; b. Myoview 3/16 neg for ischemia; c. LHC 8/16: ostLAD 80%, OM1 70%, OM2 70% x 2 lesions, mRCA 30%, dRCA 70% s/p 4-V CABG 01/24/15 (LIMA-LAD, VG- OM1, VG-OM2, VG-PDA)    Cancer (  [place])    skin, melanoma   Carotid artery disease ([facility])    a. [place] 8/16: 1-39% bilateral ICA stenosis   Cataract    removed   Diastolic dysfunction    a. TTE 8/16: EF 55-60%, no RWMA, Gr1DD, calcified mitral annulus, mild biatrial enlargement   Erectile dysfunction    GERD  (gastroesophageal reflux disease)    History of elbow surgery    History of hiatal hernia    HLD (hyperlipidemia)    HTN (hypertension)    Inferior myocardial infarction ([place]) 03/2000   stent RCA   Lung cancer ([place])    Postoperative wound infection 02/02/2015   Reflux esophagitis    Sleep apnea 2017   CPAP at night    SURGICAL HISTORY: Past Surgical History:  Procedure Laterality Date   arm surgery  2010   BROW LIFT Bilateral 11/25/2019   Procedure: BROW PTOSIS REPAIR BILATERAL;  Surgeon: Karle Starch, MD;  Location: [facility];  Service: Ophthalmology;  Laterality: Bilateral;  sleep apnea   CARDIAC CATHETERIZATION  06/24/2011   CARDIAC CATHETERIZATION N/A 01/18/2015   Procedure: Left Heart Cath with coronary angiography;  Surgeon: Minna Merritts, MD;  Location: [place];  Service: Cardiovascular;  Laterality: N/A;   CARDIAC CATHETERIZATION N/A 01/18/2015   Procedure: Intravascular Pressure Wire/FFR Study;  Surgeon: [name], MD;  Location: [place] [facility];  Service: Cardiovascular;  Laterality: N/A;   CAROTID STENT  03/10/2011   COLONOSCOPY  2010   COLONOSCOPY  06/14/2014   Dr Hilarie Fredrickson   CORONARY ARTERY BYPASS GRAFT N/A 01/24/2015   Procedure: CORONARY ARTERY BYPASS GRAFTING x 4 (LIMA-LAD, SVG-Int 1- Int 2, SVG-PD) ENDOSCOPIC GREATER SAPHENOUS VEIN HARVEST LEFT LEG;  Surgeon: [name], MD;  Location: [facility];  Service: Open Heart Surgery;  Laterality: N/A;   EMBOLECTOMY  06/15/2019   Procedure: EMBOLECTOMY;  Surgeon: Katha Cabal, MD;  Location: [facility];  Service: Vascular;;  right superficial femoral artery   ENDARTERECTOMY FEMORAL Right 06/15/2019   Procedure: ENDARTERECTOMY FEMORAL;  Surgeon: Katha Cabal, MD;  Location: [facility];  Service: Vascular;  Laterality: Right;  common femoral profunda femoris superficial femoral   ESOPHAGOGASTRODUODENOSCOPY (EGD) WITH PROPOFOL N/A 04/24/2016   Procedure:  ESOPHAGOGASTRODUODENOSCOPY (EGD) WITH PROPOFOL;  Surgeon: Jerene Bears, MD;  Location: [facility];  Service: Gastroenterology;  Laterality: N/A;   EYE SURGERY     lasik 15 yrs. ago, cataracts removed - both eyes    HAMMER TOE SURGERY     right toe   INSERTION OF ILIAC STENT Right 06/15/2019   Procedure: INSERTION OF ILIAC STENT ( STENTING OF SFA/POP ARTERY );  Surgeon: Katha Cabal, MD;  Location: [facility];  Service: Vascular;  Laterality: Right;  angioplpasty and stent placement: right superficial femoral right tibiopopliteal trunk bilateral common iliac arteries   IR IMAGING GUIDED PORT INSERTION  11/09/2020   LEFT HEART CATH AND CORONARY ANGIOGRAPHY Left 06/10/2017   Procedure: LEFT HEART CATH AND CORONARY ANGIOGRAPHY;  Surgeon: Minna Merritts, MD;  Location: [facility];  Service: Cardiovascular;  Laterality: Left;   LOWER EXTREMITY ANGIOGRAPHY Left 01/04/2019   Procedure: LOWER EXTREMITY ANGIOGRAPHY;  Surgeon: Katha Cabal, MD;  Location: [facility];  Service: Cardiovascular;  Laterality: Left;   LOWER EXTREMITY ANGIOGRAPHY Right 01/25/2019   Procedure: LOWER EXTREMITY ANGIOGRAPHY;  Surgeon: Katha Cabal, MD;  Location: [facility];  Service: Cardiovascular;  Laterality: Right;   LOWER EXTREMITY  ANGIOGRAPHY Right 01/15/2021   LOWER EXTREMITY ANGIOGRAPHY and stent placement to R SFA and popliteal artery Delana Meyer, Dolores Lory, MD)   NASAL SINUS SURGERY  2008   septpolasty, bilateral turbinate reduction   SHOULDER ARTHROSCOPY  2012   TEE WITHOUT CARDIOVERSION N/A 01/24/2015   Procedure: TRANSESOPHAGEAL ECHOCARDIOGRAM (TEE);  Surgeon: [name], MD;  Location: [facility];  Service: Open Heart Surgery;  Laterality: N/A;   TOE SURGERY  1994   UPPER GI ENDOSCOPY  07/2014, 04-24-16   Dr Raquel James   WRIST SURGERY  2011    SOCIAL HISTORY: Social History   Socioeconomic History   Marital status: Single    Spouse name: Not on file   Number  of children: Not on file   Years of education: 12   Highest education level: Not on file  Occupational History   Occupation: retired    Comment: [employer]  Tobacco Use   Smoking status: Former    Packs/day: 2.50    Years: 40.00    Pack years: 100.00    Types: Cigarettes    Quit date: 03/24/2000    Years since quitting: 21.1   Smokeless tobacco: Never  Vaping Use   Vaping Use: Never used  Substance and Sexual Activity   Alcohol use: Yes    Comment: occassionally   Drug use: No   Sexual activity: Yes  Other Topics Concern   Not on file  Social History Narrative   Singled; lives with son and dog    Occ: retired, back part time at [employer];    Activity: gym 4-5d/wk   Diet: good water, fruits/vegetables daily   Caffeine Use-yes      ------------------------------------       Car sales- retd; [employer]- retd; quit smoking 2001. Alcohol couple nights a week. Live in [place]. Daughter lives 10 mins.    Social Determinants of Health   Financial Resource Strain: Not on file  Food Insecurity: Not on file  Transportation Needs: Not on file  Physical Activity: Not on file  Stress: Not on file  Social Connections: Not on file  Intimate Partner Violence: Not on file    FAMILY HISTORY: Family History  Problem Relation Age of Onset   Hypertension Mother    Heart disease Mother    Hypertension Father    Diabetes Father    Lymphoma Sister    Heart disease Brother 65   Cancer Paternal Grandfather    Colon cancer Neg Hx    Prostate cancer Neg Hx    Bladder Cancer Neg Hx    Kidney cancer Neg Hx     ALLERGIES:  is allergic to flomax [tamsulosin].  MEDICATIONS:  Current Outpatient Medications  Medication Sig Dispense Refill   acetaminophen (TYLENOL) 325 MG tablet Take 650 mg by mouth as needed for moderate pain or mild pain.     Ascorbic Acid (VITAMIN C) 1000 MG tablet Take 1,000 mg by mouth daily.     aspirin 81 MG EC tablet Take 81 mg by mouth daily.        Calcium-Magnesium-Vitamin D (CALCIUM 1200+D3 PO) Take 1 tablet by mouth daily.     Carboxymethylcellul-Glycerin (LUBRICATING EYE DROPS OP) Place 1 drop into both eyes daily as needed (irritation).     Cholecalciferol (VITAMIN D3) 50 MCG (2000 UT) TABS Take 2,000 Units by mouth daily.     clopidogrel (PLAVIX) 75 MG tablet Take 1 tablet (75 mg total) by mouth daily. 30 tablet 11   Cyanocobalamin (  B-12) 5000 MCG CAPS Take 5,000 mcg by mouth daily.     docusate sodium (COLACE) 100 MG capsule Take 200 mg by mouth at bedtime.     erythromycin ophthalmic ointment SMARTSIG:In Eye(s)     lidocaine-prilocaine (EMLA) cream Apply 30 -45 mins prior to port access. 30 g 3   lisinopril (ZESTRIL) 5 MG tablet TAKE 1 TABLET BY MOUTH DAILY 90 tablet 0   Melatonin 10 MG CAPS Take 10 mg by mouth at bedtime as needed (sleep).     mupirocin cream (BACTROBAN) 2 % Apply 1 application topically 2 (two) times daily. 15 g 0   Niacinamide-Zn-Cu-Methfo-Se-Cr (NICOTINAMIDE PO) Take 500 mg by mouth daily.     nitroGLYCERIN (NITROSTAT) 0.4 MG SL tablet Place 1 tablet (0.4 mg total) under the tongue every 5 (five) minutes as needed for up to 25 doses for chest pain. 25 tablet 1   pantoprazole (PROTONIX) 40 MG tablet TAKE 1 TABLET BY MOUTH DAILY (Patient taking differently: Take 40 mg by mouth daily.) 90 tablet 3   Polyethyl Glycol-Propyl Glycol (LUBRICANT EYE DROPS) 0.4-0.3 % SOLN Place 1-2 drops into both eyes 3 (three) times daily as needed (burning eyes.).     predniSONE (DELTASONE) 20 MG tablet Take 1 tablet (20 mg total) by mouth daily with breakfast. Once a day with food. 40 tablet 0   simvastatin (ZOCOR) 40 MG tablet TAKE 1 TABLET BY MOUTH AT BEDTIME 90 tablet 0   traMADol (ULTRAM) 50 MG tablet TAKE 1 TABLET BY MOUTH 3 TIMES DAILY AS NEEDED FOR MODERATE PAIN 90 tablet 0   triamcinolone ointment (KENALOG) 0.5 %      Vitamin E 450 MG (1000 UT) CAPS Take 450 Units by mouth daily.     Zinc 50 MG TABS Take 50 mg by mouth daily.      ondansetron (ZOFRAN) 8 MG tablet One pill every 8 hours as needed for nausea/vomitting. (Patient not taking: Reported on 05/09/2021) 40 tablet 1   prochlorperazine (COMPAZINE) 10 MG tablet Take 1 tablet (10 mg total) by mouth every 6 (six) hours as needed for nausea or vomiting. (Patient not taking: Reported on 05/09/2021) 40 tablet 1   No current facility-administered medications for this visit.   Facility-Administered Medications Ordered in Other Visits  Medication Dose Route Frequency Provider Last Rate Last Admin   heparin lock flush 100 UNIT/ML injection            heparin lock flush 100 UNIT/ML injection               .  PHYSICAL EXAMINATION: ECOG PERFORMANCE STATUS: 0 - Asymptomatic  Vitals:   05/09/21 0952  BP: 105/66  Pulse: 80  Resp: 16  Temp: (!) 97.2 F (36.2 C)  SpO2: 95%     Filed Weights   05/09/21 0952  Weight: 213 lb (96.6 kg)       Physical Exam HENT:     Head: Normocephalic and atraumatic.     Mouth/Throat:     Pharynx: No oropharyngeal exudate.  Eyes:     Pupils: Pupils are equal, round, and reactive to light.  Cardiovascular:     Rate and Rhythm: Normal rate and regular rhythm.  Pulmonary:     Effort: No respiratory distress.     Breath sounds: No wheezing.     Comments: Decreased air entry bilaterally. Abdominal:     General: Bowel sounds are normal. There is no distension.     Palpations: Abdomen is soft. There is no  mass.     Tenderness: There is no abdominal tenderness. There is no guarding or rebound.  Musculoskeletal:        General: No tenderness. Normal range of motion.     Cervical back: Normal range of motion and neck supple.  Skin:    General: Skin is warm.  Neurological:     Mental Status: He is alert and oriented to person, place, and time.  Psychiatric:        Mood and Affect: Affect normal.     LABORATORY DATA:  I have reviewed the data as listed Lab Results  Component Value Date   WBC 13.7 (H) 05/09/2021    HGB 14.9 05/09/2021   HCT 43.1 05/09/2021   MCV 93.7 05/09/2021   PLT 244 05/09/2021   Recent Labs    04/18/21 0917 04/25/21 0928 05/09/21 0913  NA 137 136 137  K 3.9 4.1 3.9  CL 102 106 104  CO2 25 24 22   GLUCOSE 124* 118* 94  BUN 27* 29* 25*  CREATININE 0.98 0.91 0.90  CALCIUM 8.9 8.6* 9.5  GFRNONAA >60 >60 >60  PROT 6.7 6.5 7.2  ALBUMIN 4.2 4.0 4.2  AST 16 15 17   ALT 18 16 18   ALKPHOS 45 46 54  BILITOT 0.9 0.8 0.5    RADIOGRAPHIC STUDIES: I have personally reviewed the radiological images as listed and agreed with the findings in the report. CT SOFT TISSUE NECK W CONTRAST  Result Date: 04/26/2021 CLINICAL DATA:  Restaging lung cancer EXAM: CT NECK WITH CONTRAST TECHNIQUE: Multidetector CT imaging of the neck was performed using the standard protocol following the bolus administration of intravenous contrast. CONTRAST:  176mL OMNIPAQUE IOHEXOL 350 MG/ML SOLN COMPARISON:  no prior neck CT, correlation is made with PET-CT 10/17/2020. FINDINGS: Pharynx and larynx: Evaluation of the pharynx is somewhat limited by beam hardening artifact from the patient's dental hardware. Within this limitation, the pharynx and larynx are unremarkable. No mass or swelling. Salivary glands: No inflammation, mass, or stone. Thyroid: Normal. Lymph nodes: None enlarged or abnormal density. Vascular: Negative. Limited intracranial: Negative. Visualized orbits: Postsurgical changes in the globes. Otherwise unremarkable. Mastoids and visualized paranasal sinuses: Clear. Skeleton: No acute osseous abnormality. Upper chest: For findings in the thorax, please see same day CT chest. Other: None IMPRESSION: No evidence of metastatic disease in the neck. For findings in the thorax, please see same day CT chest. Electronically Signed   By: Merilyn Baba M.D.   On: 04/26/2021 19:48   CT CHEST ABDOMEN PELVIS W CONTRAST  Result Date: 04/26/2021 CLINICAL DATA:  Non-small-cell lung cancer with right retroperitoneal  metastatic lesion. EXAM: CT CHEST, ABDOMEN, AND PELVIS WITH CONTRAST TECHNIQUE: Multidetector CT imaging of the chest, abdomen and pelvis was performed following the standard protocol during bolus administration of intravenous contrast. CONTRAST:  197mL OMNIPAQUE IOHEXOL 350 MG/ML SOLN COMPARISON:  01/14/2021 FINDINGS: CT CHEST FINDINGS Cardiovascular: The heart size is normal. No substantial pericardial effusion. Coronary artery calcification is evident. Status post CABG. Moderate atherosclerotic calcification is noted in the wall of the thoracic aorta. Right Port-A-Cath tip is positioned in the distal SVC. Mediastinum/Nodes: No mediastinal lymphadenopathy. There is no hilar lymphadenopathy. Specifically, no left hilar adenopathy on the current study. The esophagus has normal imaging features. There is no axillary lymphadenopathy. Lungs/Pleura: Centrilobular emphsyema noted. Posterior left lower lobe pulmonary lesion has decreased in the interval measuring 2.1 x 1.9 cm today compared to 3.0 x 2.2 cm previously. The irregular nodular consolidative opacity seen in  the posterior right upper lobe previously has nearly resolved in the interval (see image 73/6 today). No new suspicious nodule or mass. No focal airspace consolidation. No pleural effusion. Musculoskeletal: No worrisome lytic or sclerotic osseous abnormality. CT ABDOMEN PELVIS FINDINGS Hepatobiliary: Similar appearance scattered tiny hypodensities in the liver parenchyma, most of which are too small to characterize but likely benign. There is no evidence for gallstones, gallbladder wall thickening, or pericholecystic fluid. No intrahepatic or extrahepatic biliary dilation. Pancreas: No focal mass lesion. No dilatation of the main duct. No intraparenchymal cyst. No peripancreatic edema. Spleen: No splenomegaly. No focal mass lesion. Adrenals/Urinary Tract: No adrenal nodule or mass. Right suprarenal nodule measured previously at 2.2 x 2.0 cm is now 1.6 x 1.5  cm. Stable tiny cyst lower pole left kidney. No evidence for hydroureter. The urinary bladder appears normal for the degree of distention. Stomach/Bowel: Stomach is decompressed. Duodenum is normally positioned as is the ligament of Treitz. No small bowel wall thickening. No small bowel dilatation. The terminal ileum is normal. The appendix is normal. No gross colonic mass. No colonic wall thickening. Large stool volume throughout. Vascular/Lymphatic: There is advanced atherosclerotic calcification of the abdominal aorta without aneurysm. There is no gastrohepatic or hepatoduodenal ligament lymphadenopathy. No retroperitoneal or mesenteric lymphadenopathy. No pelvic sidewall lymphadenopathy. Reproductive: The prostate gland and seminal vesicles are unremarkable. Other: No intraperitoneal free fluid. Musculoskeletal: Small left groin hernia contains only fat. No worrisome lytic or sclerotic osseous abnormality. IMPRESSION: 1. Interval decrease in size of the posterior left lower lobe pulmonary lesion with interval near complete resolution of the irregular nodular consolidative opacity in the posterior right upper lobe. 2. Interval decrease in size of the right suprarenal nodule. 3. No new or progressive findings on today's exam. 4. Large stool volume. Imaging features could be compatible with constipation in the appropriate clinical setting. 5. Aortic Atherosclerosis (ICD10-I70.0) and Emphysema (ICD10-J43.9). Electronically Signed   By: [name] Stanley M.D.   On: 04/26/2021 08:50     ASSESSMENT & PLAN:   Cancer of lower lobe of left lung ([place]) #Lung cancer-non-small cell stage IV [right suprarenal nodule-s/p biopsy "non-small cell ca"].  S/p  CarboTaxol plus Keytruda x4 CT NOV 17th, 2022- Interval decrease in size of the posterior left lower lobe pulmonary lesion with interval near complete resolution of the irregular nodular consolidative opacity in the posterior right upper lobe; Interval decrease in size of  the right suprarenal nodule.   # HOLD keytruda today. Labs today reviewed; sec to fatigue- see below. Given the stability disease- and on going fatigue below.   # Fatigue: No acute process - -September MRI brain negative for any pituitary hypophysitis/brain metastasis. Proceed with prednisone 20 mg a day. Subtle adrenal insufficiency-see below]; HOLD [place]. Dex 4 mg IVP x1 today; and the IVFS today.   # CAD-s/p CABG-/PVD-s/p stent [Dr.Schneir]-improvement in pain noted.  On Plavix --STABLE;  # IV access: Mediport functioning.  # DISPOSITION: #  HOLD Keytruda today;  Today- IVFs over 1 hour; dex 4 mg. # Follow up in 1st week of JAN- MD; labs- cbc/cmp;TSH-  possible Keytruda; Dr.B  # I reviewed the blood work- with the patient in detail; also reviewed the imaging independently [as summarized above]; and with the patient in detail.        All  questions were answered. The patient knows to call the clinic with any problems, questions or concerns.    Cammie Sickle, MD 05/09/2021 12:14 PM

## 2021-05-10 ENCOUNTER — Encounter: Payer: Self-pay | Admitting: Internal Medicine

## 2021-05-10 NOTE — Telephone Encounter (Signed)
See previous note from 11/07/20, signing note

## 2021-05-10 NOTE — Telephone Encounter (Signed)
ERx 

## 2021-05-13 ENCOUNTER — Telehealth (INDEPENDENT_AMBULATORY_CARE_PROVIDER_SITE_OTHER): Payer: Self-pay | Admitting: Vascular Surgery

## 2021-05-13 DIAGNOSIS — M62830 Muscle spasm of back: Secondary | ICD-10-CM | POA: Diagnosis not present

## 2021-05-13 DIAGNOSIS — M9905 Segmental and somatic dysfunction of pelvic region: Secondary | ICD-10-CM | POA: Diagnosis not present

## 2021-05-13 DIAGNOSIS — M955 Acquired deformity of pelvis: Secondary | ICD-10-CM | POA: Diagnosis not present

## 2021-05-13 DIAGNOSIS — M9903 Segmental and somatic dysfunction of lumbar region: Secondary | ICD-10-CM | POA: Diagnosis not present

## 2021-05-13 NOTE — Telephone Encounter (Signed)
That is fine.  He can stop his plavix but he should stay on his aspirin daily

## 2021-05-13 NOTE — Telephone Encounter (Signed)
Patient was made aware with medical advice and verbalized understanding 

## 2021-05-13 NOTE — Telephone Encounter (Signed)
Pt called stating that the Plavix that Dr. Delana Meyer put him on is making him black and blue, if he scratches himself, he bleeds forever. Please advise if he can either stop or change to something else.

## 2021-05-23 DIAGNOSIS — M955 Acquired deformity of pelvis: Secondary | ICD-10-CM | POA: Diagnosis not present

## 2021-05-23 DIAGNOSIS — M9903 Segmental and somatic dysfunction of lumbar region: Secondary | ICD-10-CM | POA: Diagnosis not present

## 2021-05-23 DIAGNOSIS — M62830 Muscle spasm of back: Secondary | ICD-10-CM | POA: Diagnosis not present

## 2021-05-23 DIAGNOSIS — M9905 Segmental and somatic dysfunction of pelvic region: Secondary | ICD-10-CM | POA: Diagnosis not present

## 2021-05-25 ENCOUNTER — Other Ambulatory Visit: Payer: Self-pay

## 2021-05-27 ENCOUNTER — Telehealth: Payer: Self-pay | Admitting: Emergency Medicine

## 2021-05-27 ENCOUNTER — Other Ambulatory Visit: Payer: Self-pay

## 2021-05-27 ENCOUNTER — Ambulatory Visit (INDEPENDENT_AMBULATORY_CARE_PROVIDER_SITE_OTHER): Payer: PPO

## 2021-05-27 DIAGNOSIS — R0600 Dyspnea, unspecified: Secondary | ICD-10-CM

## 2021-05-27 LAB — ECHOCARDIOGRAM COMPLETE
Area-P 1/2: 2.46 cm2
Calc EF: 66.5 %
S' Lateral: 4.4 cm
Single Plane A2C EF: 65.6 %
Single Plane A4C EF: 68.3 %

## 2021-05-27 NOTE — Telephone Encounter (Signed)
Called patient to go over results of echo with him. Pt verbalized understanding, voiced appreciation for call.

## 2021-05-27 NOTE — Telephone Encounter (Signed)
-----   Message from Theora Gianotti, NP sent at 05/27/2021  4:43 PM EST ----- Low-normal heart squeezing function with mild stiffness of the heart muscle.  No significant valvular disease.  Overall reassuring study.

## 2021-05-28 DIAGNOSIS — M9903 Segmental and somatic dysfunction of lumbar region: Secondary | ICD-10-CM | POA: Diagnosis not present

## 2021-05-28 DIAGNOSIS — M955 Acquired deformity of pelvis: Secondary | ICD-10-CM | POA: Diagnosis not present

## 2021-05-28 DIAGNOSIS — M62830 Muscle spasm of back: Secondary | ICD-10-CM | POA: Diagnosis not present

## 2021-05-28 DIAGNOSIS — M9905 Segmental and somatic dysfunction of pelvic region: Secondary | ICD-10-CM | POA: Diagnosis not present

## 2021-06-08 ENCOUNTER — Other Ambulatory Visit: Payer: Self-pay | Admitting: Family Medicine

## 2021-06-08 ENCOUNTER — Other Ambulatory Visit: Payer: Self-pay | Admitting: Cardiovascular Disease

## 2021-06-10 DIAGNOSIS — C44722 Squamous cell carcinoma of skin of right lower limb, including hip: Secondary | ICD-10-CM | POA: Diagnosis not present

## 2021-06-10 DIAGNOSIS — D0471 Carcinoma in situ of skin of right lower limb, including hip: Secondary | ICD-10-CM | POA: Diagnosis not present

## 2021-06-10 NOTE — Telephone Encounter (Signed)
ERx 

## 2021-06-13 ENCOUNTER — Inpatient Hospital Stay: Payer: PPO

## 2021-06-13 ENCOUNTER — Inpatient Hospital Stay: Payer: PPO | Attending: Internal Medicine

## 2021-06-13 ENCOUNTER — Inpatient Hospital Stay (HOSPITAL_BASED_OUTPATIENT_CLINIC_OR_DEPARTMENT_OTHER): Payer: PPO | Admitting: Internal Medicine

## 2021-06-13 ENCOUNTER — Encounter: Payer: Self-pay | Admitting: Internal Medicine

## 2021-06-13 ENCOUNTER — Other Ambulatory Visit: Payer: Self-pay

## 2021-06-13 VITALS — BP 118/62 | HR 66

## 2021-06-13 DIAGNOSIS — Z79899 Other long term (current) drug therapy: Secondary | ICD-10-CM | POA: Diagnosis not present

## 2021-06-13 DIAGNOSIS — Z5112 Encounter for antineoplastic immunotherapy: Secondary | ICD-10-CM | POA: Insufficient documentation

## 2021-06-13 DIAGNOSIS — R197 Diarrhea, unspecified: Secondary | ICD-10-CM | POA: Diagnosis not present

## 2021-06-13 DIAGNOSIS — C3432 Malignant neoplasm of lower lobe, left bronchus or lung: Secondary | ICD-10-CM

## 2021-06-13 DIAGNOSIS — I2510 Atherosclerotic heart disease of native coronary artery without angina pectoris: Secondary | ICD-10-CM | POA: Diagnosis not present

## 2021-06-13 DIAGNOSIS — Z7982 Long term (current) use of aspirin: Secondary | ICD-10-CM | POA: Insufficient documentation

## 2021-06-13 DIAGNOSIS — I10 Essential (primary) hypertension: Secondary | ICD-10-CM | POA: Insufficient documentation

## 2021-06-13 DIAGNOSIS — E871 Hypo-osmolality and hyponatremia: Secondary | ICD-10-CM | POA: Diagnosis not present

## 2021-06-13 DIAGNOSIS — R5383 Other fatigue: Secondary | ICD-10-CM | POA: Diagnosis not present

## 2021-06-13 DIAGNOSIS — Z87891 Personal history of nicotine dependence: Secondary | ICD-10-CM | POA: Diagnosis not present

## 2021-06-13 LAB — CBC WITH DIFFERENTIAL/PLATELET
Abs Immature Granulocytes: 0.04 10*3/uL (ref 0.00–0.07)
Basophils Absolute: 0 10*3/uL (ref 0.0–0.1)
Basophils Relative: 0 %
Eosinophils Absolute: 0.2 10*3/uL (ref 0.0–0.5)
Eosinophils Relative: 2 %
HCT: 40.7 % (ref 39.0–52.0)
Hemoglobin: 13.7 g/dL (ref 13.0–17.0)
Immature Granulocytes: 1 %
Lymphocytes Relative: 15 %
Lymphs Abs: 1.2 10*3/uL (ref 0.7–4.0)
MCH: 31.9 pg (ref 26.0–34.0)
MCHC: 33.7 g/dL (ref 30.0–36.0)
MCV: 94.9 fL (ref 80.0–100.0)
Monocytes Absolute: 1.1 10*3/uL — ABNORMAL HIGH (ref 0.1–1.0)
Monocytes Relative: 15 %
Neutro Abs: 5.3 10*3/uL (ref 1.7–7.7)
Neutrophils Relative %: 67 %
Platelets: 157 10*3/uL (ref 150–400)
RBC: 4.29 MIL/uL (ref 4.22–5.81)
RDW: 12.8 % (ref 11.5–15.5)
WBC: 7.9 10*3/uL (ref 4.0–10.5)
nRBC: 0 % (ref 0.0–0.2)

## 2021-06-13 LAB — COMPREHENSIVE METABOLIC PANEL
ALT: 18 U/L (ref 0–44)
AST: 18 U/L (ref 15–41)
Albumin: 3.7 g/dL (ref 3.5–5.0)
Alkaline Phosphatase: 42 U/L (ref 38–126)
Anion gap: 9 (ref 5–15)
BUN: 17 mg/dL (ref 8–23)
CO2: 25 mmol/L (ref 22–32)
Calcium: 9 mg/dL (ref 8.9–10.3)
Chloride: 103 mmol/L (ref 98–111)
Creatinine, Ser: 1.05 mg/dL (ref 0.61–1.24)
GFR, Estimated: >60 mL/min (ref >60)
Glucose, Bld: 89 mg/dL (ref 70–99)
Potassium: 3.5 mmol/L (ref 3.5–5.1)
Sodium: 137 mmol/L (ref 135–145)
Total Bilirubin: 0.6 mg/dL (ref 0.3–1.2)
Total Protein: 6.3 g/dL — ABNORMAL LOW (ref 6.5–8.1)

## 2021-06-13 LAB — TSH: TSH: 1.063 u[IU]/mL (ref 0.350–4.500)

## 2021-06-13 MED ORDER — SODIUM CHLORIDE 0.9 % IV SOLN
200.0000 mg | Freq: Once | INTRAVENOUS | Status: AC
  Administered 2021-06-13: 200 mg via INTRAVENOUS
  Filled 2021-06-13: qty 8

## 2021-06-13 MED ORDER — HEPARIN SOD (PORK) LOCK FLUSH 100 UNIT/ML IV SOLN
INTRAVENOUS | Status: AC
  Administered 2021-06-13: 500 [IU]
  Filled 2021-06-13: qty 5

## 2021-06-13 MED ORDER — SODIUM CHLORIDE 0.9 % IV SOLN
Freq: Once | INTRAVENOUS | Status: AC
  Filled 2021-06-13: qty 250

## 2021-06-13 MED ORDER — PREDNISONE 20 MG PO TABS: ORAL_TABLET | ORAL | 0 refills | Status: DC

## 2021-06-13 NOTE — Patient Instructions (Signed)
[facility] CANCER CTR AT [place]  Discharge Instructions: Thank you for choosing [facility] to provide your oncology and hematology care.  If you have a lab appointment with the [facility], please go directly to the [facility] and check in at the registration area.  Wear comfortable clothing and clothing appropriate for easy access to any Portacath or PICC line.   We strive to give you quality time with your provider. You may need to reschedule your appointment if you arrive late (15 or more minutes).  Arriving late affects you and other patients whose appointments are after yours.  Also, if you miss three or more appointments without notifying the office, you may be dismissed from the clinic at the providers discretion.      For prescription refill requests, have your pharmacy contact our office and allow 72 hours for refills to be completed.    Today you received the following chemotherapy and/or immunotherapy agents       To help prevent nausea and vomiting after your treatment, we encourage you to take your nausea medication as directed.  BELOW ARE SYMPTOMS THAT SHOULD BE REPORTED IMMEDIATELY: *FEVER GREATER THAN 100.4 F (38 C) OR HIGHER *CHILLS OR SWEATING *NAUSEA AND VOMITING THAT IS NOT CONTROLLED WITH YOUR NAUSEA MEDICATION *UNUSUAL SHORTNESS OF BREATH *UNUSUAL BRUISING OR BLEEDING *URINARY PROBLEMS (pain or burning when urinating, or frequent urination) *BOWEL PROBLEMS (unusual diarrhea, constipation, pain near the anus) TENDERNESS IN MOUTH AND THROAT WITH OR WITHOUT PRESENCE OF ULCERS (sore throat, sores in mouth, or a toothache) UNUSUAL RASH, SWELLING OR PAIN  UNUSUAL VAGINAL DISCHARGE OR ITCHING   Items with * indicate a potential emergency and should be followed up as soon as possible or go to the Emergency Department if any problems should occur.  Please show the CHEMOTHERAPY ALERT CARD or IMMUNOTHERAPY ALERT CARD at check-in to the  Emergency Department and triage nurse.  Should you have questions after your visit or need to cancel or reschedule your appointment, please contact [facility] CANCER [place] AT [place]  [phone] and follow the prompts.  Office hours are 8:00 a.m. to 4:30 p.m. Monday - Friday. Please note that voicemails left after 4:00 p.m. may not be returned until the following business day.  We are closed weekends and major holidays. You have access to a nurse at all times for urgent questions. Please call the main number to the clinic [phone] and follow the prompts.  For any non-urgent questions, you may also contact your provider using MyChart. We now offer e-Visits for anyone 39 and older to request care online for non-urgent symptoms. For details visit [email or web address].   Also download the MyChart app! Go to the app store, search "MyChart", open the app, select [facility], and log in with your MyChart username and password.  Due to Covid, a mask is required upon entering the hospital/clinic. If you do not have a mask, one will be given to you upon arrival. For doctor visits, patients may have 1 support person aged 32 or older with them. For treatment visits, patients cannot have anyone with them due to current Covid guidelines and our immunocompromised population.

## 2021-06-13 NOTE — Progress Notes (Signed)
[facility] NOTE  Patient Care Team: Ria Bush, MD as PCP - General (Family Medicine) [name] [name], MD as PCP - Cardiology (Cardiology) [name], MD (Internal Medicine) Minna Merritts, MD as Consulting Physician (Cardiology) Pieter Partridge, DO as Consulting Physician (Neurology) Cammie Sickle, MD as Consulting Physician (Hematology and Oncology) Telford Nab, RN as Oncology Nurse Navigator  CHIEF COMPLAINTS/PURPOSE OF CONSULTATION: lung cancer    Oncology History Overview Note  #MAY 2022-Lung cancer-non-small cell [CT guided bx] T3N1 vs stage IV Dr.Hendrickson.  MRI brain negative for malignancy.# 1. April 2022- LLL ~4.0 cm mass in the superior segment left lower lobe abuts the major fissure and the posterior pleural surface without visible chest wall invasion without pleural effusion. There 2-3 other small nodules in the left lower lobe, largest measures 9 mm in diameter. These are suspicious for same lobe satellite lesions and there is left hilar adenopathy. Assuming non-small cell lung cancer the appearance is compatible with T3 N1 M0 disease (stage IIIA).  # right suprarenal nodule-awaiting biopsy on 5/31- non-small cell [revived at tumor conference at [place]  # June 9th 2022- CARBO-TAXOl-KEYTRUDA; Fulphila  # ? Subtle adrenal insufficiency- No acute process - -September MRI brain negative for any pituitary hypophysitis/brain metastasis. Prednisone   MOLECULAR TESTING: NGS TPS-PDL 100%; EXON 12 amplification* mutations.    # CAD [CABG 2016; Dr.Gollan]   Cancer of lower lobe of left lung ([place])  11/02/2020 Initial Diagnosis   Cancer of lower lobe of left lung ([place])   11/02/2020 Cancer Staging   Staging form: Lung, AJCC 8th Edition - Clinical: Stage IVA (cT3, cN1, cM1a) - Signed by Cammie Sickle, MD on 11/02/2020    11/16/2020 -  Chemotherapy   Patient is on Treatment Plan : LUNG NSCLC Carboplatin + Paclitaxel +  Pembrolizumab q21d x 4 cycles / Pembrolizumab Maintenance Q21D        HISTORY OF PRESENTING ILLNESS: Alone.  Walking independently.  Randall Ali. 81 y.o.  male lung cancer-non-small cell stage IV [supra-renal nodule s/p biopsy] currently on Keytruda is for follow-up.  Patient had intermittent treatment break from [facility] because of ongoing fatigue.  Patient continues to make prednisone 20 mg a day.  Noted to have significant improvement of his energy levels.  He continues to be working in the gym/working.  No nausea no vomiting.  No headaches.  Review of Systems  Constitutional:  Positive for malaise/fatigue. Negative for chills, diaphoresis, fever and weight loss.  HENT:  Negative for nosebleeds and sore throat.   Eyes:  Negative for double vision.  Respiratory:  Negative for cough, hemoptysis, sputum production, shortness of breath and wheezing.   Cardiovascular:  Negative for chest pain, palpitations, orthopnea and leg swelling.  Gastrointestinal:  Negative for abdominal pain, blood in stool, constipation, diarrhea, heartburn, melena, nausea and vomiting.  Genitourinary:  Negative for dysuria, frequency and urgency.  Musculoskeletal:  Positive for back pain and joint pain.  Skin: Negative.  Negative for itching and rash.  Neurological:  Negative for dizziness, tingling, focal weakness, weakness and headaches.  Endo/Heme/Allergies:  Does not bruise/bleed easily.  Psychiatric/Behavioral:  Negative for depression. The patient is not nervous/anxious and does not have insomnia.     MEDICAL HISTORY:  Past Medical History:  Diagnosis Date   Allergy    seasonal   Arthritis    all over- in general    CAD (coronary artery disease)    a. inferior wall MI 10/01 s/p PCI/DES  to RCA; b. Myoview 3/16 neg for ischemia; c. LHC 8/16: ostLAD 80%, OM1 70%, OM2 70% x 2 lesions, mRCA 30%, dRCA 70% s/p 4-V CABG 01/24/15 (LIMA-LAD, VG- OM1, VG-OM2, VG-PDA)    Cancer (HCC)    skin,  melanoma   Carotid artery disease ([place])    a. [place] 8/16: 1-39% bilateral ICA stenosis   Cataract    removed   Diastolic dysfunction    a. TTE 8/16: EF 55-60%, no RWMA, Gr1DD, calcified mitral annulus, mild biatrial enlargement   Erectile dysfunction    GERD (gastroesophageal reflux disease)    History of elbow surgery    History of hiatal hernia    HLD (hyperlipidemia)    HTN (hypertension)    Inferior myocardial infarction ([place]) 03/2000   stent RCA   Lung cancer ([place])    Postoperative wound infection 02/02/2015   Reflux esophagitis    Sleep apnea 2017   CPAP at night    SURGICAL HISTORY: Past Surgical History:  Procedure Laterality Date   arm surgery  2010   BROW LIFT Bilateral 11/25/2019   Procedure: BROW PTOSIS REPAIR BILATERAL;  Surgeon: Karle Starch, MD;  Location: [facility];  Service: Ophthalmology;  Laterality: Bilateral;  sleep apnea   CARDIAC CATHETERIZATION  06/24/2011   CARDIAC CATHETERIZATION N/A 01/18/2015   Procedure: Left Heart Cath with coronary angiography;  Surgeon: Minna Merritts, MD;  Location: [facility] CV LAB;  Service: Cardiovascular;  Laterality: N/A;   CARDIAC CATHETERIZATION N/A 01/18/2015   Procedure: Intravascular Pressure Wire/FFR Study;  Surgeon: [name], MD;  Location: [facility] LAB;  Service: Cardiovascular;  Laterality: N/A;   CAROTID STENT  03/10/2011   COLONOSCOPY  2010   COLONOSCOPY  06/14/2014   Dr Hilarie Fredrickson   CORONARY ARTERY BYPASS GRAFT N/A 01/24/2015   Procedure: CORONARY ARTERY BYPASS GRAFTING x 4 (LIMA-LAD, SVG-Int 1- Int 2, SVG-PD) ENDOSCOPIC GREATER SAPHENOUS VEIN HARVEST LEFT LEG;  Surgeon: [name], MD;  Location: [facility];  Service: Open Heart Surgery;  Laterality: N/A;   EMBOLECTOMY  06/15/2019   Procedure: EMBOLECTOMY;  Surgeon: Katha Cabal, MD;  Location: [facility];  Service: Vascular;;  right superficial femoral artery   ENDARTERECTOMY FEMORAL Right 06/15/2019    Procedure: ENDARTERECTOMY FEMORAL;  Surgeon: Katha Cabal, MD;  Location: [facility];  Service: Vascular;  Laterality: Right;  common femoral profunda femoris superficial femoral   ESOPHAGOGASTRODUODENOSCOPY (EGD) WITH PROPOFOL N/A 04/24/2016   Procedure: ESOPHAGOGASTRODUODENOSCOPY (EGD) WITH PROPOFOL;  Surgeon: Jerene Bears, MD;  Location: [facility];  Service: Gastroenterology;  Laterality: N/A;   EYE SURGERY     lasik 15 yrs. ago, cataracts removed - both eyes    HAMMER TOE SURGERY     right toe   INSERTION OF ILIAC STENT Right 06/15/2019   Procedure: INSERTION OF ILIAC STENT ( STENTING OF SFA/POP ARTERY );  Surgeon: Katha Cabal, MD;  Location: [facility];  Service: Vascular;  Laterality: Right;  angioplpasty and stent placement: right superficial femoral right tibiopopliteal trunk bilateral common iliac arteries   IR IMAGING GUIDED PORT INSERTION  11/09/2020   LEFT HEART CATH AND CORONARY ANGIOGRAPHY Left 06/10/2017   Procedure: LEFT HEART CATH AND CORONARY ANGIOGRAPHY;  Surgeon: Minna Merritts, MD;  Location: [facility];  Service: Cardiovascular;  Laterality: Left;   LOWER EXTREMITY ANGIOGRAPHY Left 01/04/2019   Procedure: LOWER EXTREMITY ANGIOGRAPHY;  Surgeon: Katha Cabal, MD;  Location: [facility] LAB;  Service: Cardiovascular;  Laterality: Left;   LOWER EXTREMITY ANGIOGRAPHY Right 01/25/2019   Procedure: LOWER EXTREMITY ANGIOGRAPHY;  Surgeon: Katha Cabal, MD;  Location: [facility];  Service: Cardiovascular;  Laterality: Right;   LOWER EXTREMITY ANGIOGRAPHY Right 01/15/2021   LOWER EXTREMITY ANGIOGRAPHY and stent placement to R SFA and popliteal artery Delana Meyer, Dolores Lory, MD)   NASAL SINUS SURGERY  2008   septpolasty, bilateral turbinate reduction   SHOULDER ARTHROSCOPY  2012   TEE WITHOUT CARDIOVERSION N/A 01/24/2015   Procedure: TRANSESOPHAGEAL ECHOCARDIOGRAM (TEE);  Surgeon: [name], MD;  Location: [facility];  Service: Open Heart Surgery;  Laterality: N/A;   TOE SURGERY  1994   UPPER GI ENDOSCOPY  07/2014, 04-24-16   Dr Raquel James   WRIST SURGERY  2011    SOCIAL HISTORY: Social History   Socioeconomic History   Marital status: Single    Spouse name: Not on file   Number of children: Not on file   Years of education: 12   Highest education level: Not on file  Occupational History   Occupation: retired    Comment: [employer]  Tobacco Use   Smoking status: Former    Packs/day: 2.50    Years: 40.00    Pack years: 100.00    Types: Cigarettes    Quit date: 03/24/2000    Years since quitting: 21.2   Smokeless tobacco: Never  Vaping Use   Vaping Use: Never used  Substance and Sexual Activity   Alcohol use: Yes    Comment: occassionally   Drug use: No   Sexual activity: Yes  Other Topics Concern   Not on file  Social History Narrative   Singled; lives with son and dog    Occ: retired, back part time at [employer];    Activity: gym 4-5d/wk   Diet: good water, fruits/vegetables daily   Caffeine Use-yes      ------------------------------------       Car sales- retd; [employer]- retd; quit smoking 2001. Alcohol couple nights a week. Live in [place]. Daughter lives 10 mins.    Social Determinants of Health   Financial Resource Strain: Not on file  Food Insecurity: Not on file  Transportation Needs: Not on file  Physical Activity: Not on file  Stress: Not on file  Social Connections: Not on file  Intimate Partner Violence: Not on file    FAMILY HISTORY: Family History  Problem Relation Age of Onset   Hypertension Mother    Heart disease Mother    Hypertension Father    Diabetes Father    Lymphoma Sister    Heart disease Brother 4   Cancer Paternal Grandfather    Colon cancer Neg Hx    Prostate cancer Neg Hx    Bladder Cancer Neg Hx    Kidney cancer Neg Hx     ALLERGIES:  is allergic to flomax [tamsulosin].  MEDICATIONS:  Current  Outpatient Medications  Medication Sig Dispense Refill   acetaminophen (TYLENOL) 325 MG tablet Take 650 mg by mouth as needed for moderate pain or mild pain.     Ascorbic Acid (VITAMIN C) 1000 MG tablet Take 1,000 mg by mouth daily.     aspirin 81 MG EC tablet Take 81 mg by mouth daily.       Calcium-Magnesium-Vitamin D (CALCIUM 1200+D3 PO) Take 1 tablet by mouth daily.     Carboxymethylcellul-Glycerin (LUBRICATING EYE DROPS OP) Place 1 drop into both eyes daily as needed (irritation).     Cholecalciferol (  VITAMIN D3) 50 MCG (2000 UT) TABS Take 2,000 Units by mouth daily.     Cyanocobalamin (B-12) 5000 MCG CAPS Take 5,000 mcg by mouth daily.     docusate sodium (COLACE) 100 MG capsule Take 200 mg by mouth at bedtime.     lidocaine-prilocaine (EMLA) cream Apply 30 -45 mins prior to port access. 30 g 3   lisinopril (ZESTRIL) 5 MG tablet TAKE 1 TABLET BY MOUTH DAILY 90 tablet 0   Melatonin 10 MG CAPS Take 10 mg by mouth at bedtime as needed (sleep).     nitroGLYCERIN (NITROSTAT) 0.4 MG SL tablet Place 1 tablet (0.4 mg total) under the tongue every 5 (five) minutes as needed for up to 25 doses for chest pain. 25 tablet 1   pantoprazole (PROTONIX) 40 MG tablet TAKE 1 TABLET BY MOUTH DAILY (Patient taking differently: Take 40 mg by mouth daily.) 90 tablet 3   Polyethyl Glycol-Propyl Glycol (LUBRICANT EYE DROPS) 0.4-0.3 % SOLN Place 1-2 drops into both eyes 3 (three) times daily as needed (burning eyes.).     prochlorperazine (COMPAZINE) 10 MG tablet Take 1 tablet (10 mg total) by mouth every 6 (six) hours as needed for nausea or vomiting. 40 tablet 1   simvastatin (ZOCOR) 40 MG tablet TAKE 1 TABLET BY MOUTH AT BEDTIME 90 tablet 0   traMADol (ULTRAM) 50 MG tablet TAKE 1 TABLET BY MOUTH 3 TIMES DAILY AS NEEDED FOR MODERATE PAIN 90 tablet 0   Vitamin E 450 MG (1000 UT) CAPS Take 450 Units by mouth daily.     Zinc 50 MG TABS Take 50 mg by mouth daily.     clopidogrel (PLAVIX) 75 MG  tablet Take 1 tablet (75 mg total) by mouth daily. (Patient not taking: Reported on 06/13/2021) 30 tablet 11   erythromycin ophthalmic ointment SMARTSIG:In Eye(s) (Patient not taking: Reported on 06/13/2021)     mupirocin cream (BACTROBAN) 2 % Apply 1 application topically 2 (two) times daily. (Patient not taking: Reported on 06/13/2021) 15 g 0   Niacinamide-Zn-Cu-Methfo-Se-Cr (NICOTINAMIDE PO) Take 500 mg by mouth daily. (Patient not taking: Reported on 06/13/2021)     ondansetron (ZOFRAN) 8 MG tablet One pill every 8 hours as needed for nausea/vomitting. (Patient not taking: Reported on 05/09/2021) 40 tablet 1   predniSONE (DELTASONE) 20 MG tablet Take 1/2 a pill. Once a day with food. 40 tablet 0   triamcinolone ointment (KENALOG) 0.5 %  (Patient not taking: Reported on 06/13/2021)     No current facility-administered medications for this visit.   Facility-Administered Medications Ordered in Other Visits  Medication Dose Route Frequency Provider Last Rate Last Admin   heparin lock flush 100 UNIT/ML injection               .  PHYSICAL EXAMINATION: ECOG PERFORMANCE STATUS: 0 - Asymptomatic  Vitals:   06/13/21 0921  BP: (!) 112/59  Pulse: 68  Temp: 98.1 F (36.7 C)  SpO2: 96%     Filed Weights   06/13/21 0921  Weight: 212 lb 12.8 oz (96.5 kg)       Physical Exam HENT:     Head: Normocephalic and atraumatic.     Mouth/Throat:     Pharynx: No oropharyngeal exudate.  Eyes:     Pupils: Pupils are equal, round, and reactive to light.  Cardiovascular:     Rate and Rhythm: Normal rate and regular rhythm.  Pulmonary:     Effort: No respiratory distress.     Breath sounds:  No wheezing.     Comments: Decreased air entry bilaterally. Abdominal:     General: Bowel sounds are normal. There is no distension.     Palpations: Abdomen is soft. There is no mass.     Tenderness: There is no abdominal tenderness. There is no guarding or rebound.  Musculoskeletal:        General: No  tenderness. Normal range of motion.     Cervical back: Normal range of motion and neck supple.  Skin:    General: Skin is warm.  Neurological:     Mental Status: He is alert and oriented to person, place, and time.  Psychiatric:        Mood and Affect: Affect normal.     LABORATORY DATA:  I have reviewed the data as listed Lab Results  Component Value Date   WBC 7.9 06/13/2021   HGB 13.7 06/13/2021   HCT 40.7 06/13/2021   MCV 94.9 06/13/2021   PLT 157 06/13/2021   Recent Labs    04/25/21 0928 05/09/21 0913 06/13/21 0856  NA 136 137 137  K 4.1 3.9 3.5  CL 106 104 103  CO2 _0 GLUCOSE 118* 94 89  BUN 29* 25* 17  CREATININE 0.91 0.90 1.05  CALCIUM 8.6* 9.5 9.0  GFRNONAA >60 >60 >60  PROT 6.5 7.2 6.3*  ALBUMIN 4.0 4.2 3.7  AST _1 ALT _2 ALKPHOS 46 54 42  BILITOT 0.8 0.5 0.6    RADIOGRAPHIC STUDIES: I have personally reviewed the radiological images as listed and agreed with the findings in the report. ECHOCARDIOGRAM COMPLETE  Result Date: 05/27/2021    ECHOCARDIOGRAM REPORT   Patient Name:   Randall Ali. Date of Exam: 05/27/2021 Medical Rec #:  [id number]           Height:       68.0 in Accession #:    [id number]          Weight:       213.0 lb Date of Birth:  [birth year]           BSA:          2.099 m Patient Age:    56 years            BP:           105/66 mmHg Patient Gender: M                   HR:           67 bpm. Exam Location:  [facility] Procedure: 2D Echo, Cardiac Doppler, Color Doppler, 3D Echo and Strain Analysis Indications:    R06.02 SOB  History:        Patient has prior history of Echocardiogram examinations, most                 recent 04/18/2018. CAD, Prior CABG, PAD; Risk Factors:Sleep                 Apnea, Dyslipidemia and Hypertension.  Sonographer:    Hester Mates BS, RVT, RDCS Referring Phys: [phone] [place]  1. Left ventricular ejection fraction, by estimation, is 50 to 55%. The left ventricle has low  normal function. The left ventricle has no regional wall motion abnormalities. Left ventricular diastolic parameters are consistent with Grade I diastolic dysfunction (impaired relaxation). The average left ventricular global longitudinal strain is -16.8 %.  2. Right ventricular  systolic function is normal. The right ventricular size is normal.  3. Right atrial size was mildly dilated.  4. The mitral valve is normal in structure. No evidence of mitral valve regurgitation.  5. The aortic valve is calcified. Aortic valve regurgitation is not visualized. Aortic valve sclerosis is present, with no evidence of aortic valve stenosis.  6. The inferior vena cava is normal in size with greater than 50% respiratory variability, suggesting right atrial pressure of 3 mmHg. Comparison(s): Previous Echo showed LV EF 55-60%, no RWMA, MAC and mild MR. FINDINGS  Left Ventricle: Left ventricular ejection fraction, by estimation, is 50 to 55%. The left ventricle has low normal function. The left ventricle has no regional wall motion abnormalities. The average left ventricular global longitudinal strain is -16.8 %. 3D left ventricular ejection fraction analysis performed but not reported based on interpreter judgement due to suboptimal tracking. The left ventricular internal cavity size was normal in size. There is no left ventricular hypertrophy. Left ventricular diastolic parameters are consistent with Grade I diastolic dysfunction (impaired relaxation). Right Ventricle: The right ventricular size is normal. No increase in right ventricular wall thickness. Right ventricular systolic function is normal. Left Atrium: Left atrial size was normal in size. Right Atrium: Right atrial size was mildly dilated. Pericardium: There is no evidence of pericardial effusion. Mitral Valve: The mitral valve is normal in structure. No evidence of mitral valve regurgitation. Tricuspid Valve: The tricuspid valve is normal in structure. Tricuspid valve  regurgitation is mild. Aortic Valve: The aortic valve is calcified. Aortic valve regurgitation is not visualized. Aortic valve sclerosis is present, with no evidence of aortic valve stenosis. Pulmonic Valve: The pulmonic valve was normal in structure. Pulmonic valve regurgitation is mild. Aorta: The aortic root and ascending aorta are structurally normal, with no evidence of dilitation. Venous: The inferior vena cava is normal in size with greater than 50% respiratory variability, suggesting right atrial pressure of 3 mmHg. IAS/Shunts: No atrial level shunt detected by color flow Doppler.  LEFT VENTRICLE PLAX 2D LVIDd:         5.50 cm     Diastology LVIDs:         4.40 cm     LV e' medial:    6.53 cm/s LV PW:         1.00 cm     LV E/e' medial:  10.3 LV IVS:        1.10 cm     LV e' lateral:   10.60 cm/s LVOT diam:     2.30 cm     LV E/e' lateral: 6.3 LV SV:         94 LV SV Index:   45          2D Longitudinal Strain LVOT Area:     4.15 cm    2D Strain GLS Avg:     -16.8 %  LV Volumes (MOD) LV vol d, MOD A2C: 76.7 ml 3D Volume EF: LV vol d, MOD A4C: 92.3 ml 3D EF:        55 % LV vol s, MOD A2C: 26.4 ml LV EDV:       142 ml LV vol s, MOD A4C: 29.3 ml LV ESV:       63 ml LV SV MOD A2C:     50.3 ml LV SV:        79 ml LV SV MOD A4C:     92.3 ml LV SV MOD BP:  57.4 ml RIGHT VENTRICLE RV S prime:     8.05 cm/s TAPSE (M-mode): 1.4 cm LEFT ATRIUM           Index        RIGHT ATRIUM           Index LA diam:      4.20 cm 2.00 cm/m   RA Area:     24.90 cm LA Vol (A4C): 70.9 ml 33.78 ml/m  RA Volume:   94.10 ml  44.83 ml/m  AORTIC VALVE             PULMONIC VALVE LVOT Vmax:   102.00 cm/s PV Vmax:       1.00 m/s LVOT Vmean:  71.300 cm/s PV Peak grad:  4.0 mmHg LVOT VTI:    0.226 m  AORTA Ao Root diam: 3.80 cm Ao Asc diam:  3.50 cm MITRAL VALVE MV Area (PHT): 2.46 cm     SHUNTS MV Decel Time: 309 msec     Systemic VTI:  0.23 m MV E velocity: 67.30 cm/s   Systemic Diam: 2.30 cm MV A velocity: 113.00 cm/s MV E/A ratio:   0.60 Kate Sable MD Electronically signed by Kate Sable MD Signature Date/Time: 05/27/2021/12:44:42 PM    Final      ASSESSMENT & PLAN:   Cancer of lower lobe of left lung (HCC) #Lung cancer-non-small cell stage IV [right suprarenal nodule-s/p biopsy "non-small cell ca"].  S/p  CarboTaxol plus Keytruda x4 CT NOV 17th, 2022- Interval decrease in size of the posterior left lower lobe pulmonary lesion with interval near complete resolution of the irregular nodular consolidative opacity in the posterior right upper lobe; Interval decrease in size of the right suprarenal nodule.  Clinically stable.  #Patient tolerating Keytruda with mild to moderate difficulties-severe fatigue/questionable insufficiency.  However, given his aggressive disease-think it is reasonable to [employer].  Patient agreement.  # Proceed with [place] today. Labs today reviewed;  acceptable for treatment today. Will order imaging at next visit.   # ? Subtle adrenal insufficiency-[ No acute process - -September MRI brain negative for any pituitary hypophysitis/brain metastasis]. Continue with long-term prednisone; but at 10 mg/day.  New prescription sent.  # CAD-s/p CABG-/PVD-s/p stent [Dr.Schneir]-improvement in pain noted.  On Plavix --STABLE;  # IV access: Mediport functioning.  # DISPOSITION: #   Keytruda today;   # Follow up in 3 weeks- MD; labs- cbc/cmp; Keytruda; Dr.B     All  questions were answered. The patient knows to call the clinic with any problems, questions or concerns.    Cammie Sickle, MD 06/13/2021 11:06 AM

## 2021-06-13 NOTE — Assessment & Plan Note (Addendum)
#  Lung cancer-non-small cell stage IV [right suprarenal nodule-s/p biopsy "non-small cell ca"].  S/p  CarboTaxol plus Keytruda x4 CT NOV 17th, 2022- Interval decrease in size of the posterior left lower lobe pulmonary lesion with interval near complete resolution of the irregular nodular consolidative opacity in the posterior right upper lobe; Interval decrease in size of the right suprarenal nodule. Clinically stable.  #Patient tolerating Keytruda with mild to moderate difficulties-severe fatigue/questionable insufficiency.  However, given his aggressive disease-think it is reasonable to [employer].  Patient agreement.  # Proceed with [place] today. Labs today reviewed;  acceptable for treatment today. Will order imaging at next visit.   # ? Subtle adrenal insufficiency-[ No acute process - -September MRI brain negative for any pituitary hypophysitis/brain metastasis]. Continue with long-term prednisone; but at 10 mg/day.  New prescription sent.  # CAD-s/p CABG-/PVD-s/p stent [Dr.Schneir]-improvement in pain noted.  On Plavix --STABLE;  # IV access: Mediport functioning.  # DISPOSITION: #   Keytruda today;   # Follow up in 3 weeks- MD; labs- cbc/cmp; Keytruda; Dr.B

## 2021-06-17 ENCOUNTER — Telehealth: Payer: Self-pay | Admitting: *Deleted

## 2021-06-17 ENCOUNTER — Inpatient Hospital Stay: Payer: PPO

## 2021-06-17 ENCOUNTER — Other Ambulatory Visit: Payer: Self-pay

## 2021-06-17 VITALS — BP 130/77 | HR 66 | Temp 97.7°F | Resp 17

## 2021-06-17 DIAGNOSIS — Z5112 Encounter for antineoplastic immunotherapy: Secondary | ICD-10-CM | POA: Diagnosis not present

## 2021-06-17 DIAGNOSIS — C3432 Malignant neoplasm of lower lobe, left bronchus or lung: Secondary | ICD-10-CM

## 2021-06-17 MED ORDER — HEPARIN SOD (PORK) LOCK FLUSH 100 UNIT/ML IV SOLN
500.0000 [IU] | Freq: Once | INTRAVENOUS | Status: AC | PRN
  Administered 2021-06-17: 500 [IU]
  Filled 2021-06-17: qty 5

## 2021-06-17 MED ORDER — SODIUM CHLORIDE 0.9% FLUSH
10.0000 mL | Freq: Once | INTRAVENOUS | Status: AC | PRN
  Administered 2021-06-17: 10 mL
  Filled 2021-06-17: qty 10

## 2021-06-17 MED ORDER — SODIUM CHLORIDE 0.9 % IV SOLN
Freq: Once | INTRAVENOUS | Status: AC
  Filled 2021-06-17: qty 250

## 2021-06-17 NOTE — Telephone Encounter (Signed)
Pt has been contacted and scheduled for IV fluids this afternoon.

## 2021-06-17 NOTE — Progress Notes (Signed)
Added on for [facility] IV Fluids. Pt states he just feels so tired. He took chemo and by Friday night he was exhausted. Denies any nausea or diarrhea. Has chronic back and right foot pain which he said is mild today.

## 2021-06-17 NOTE — Telephone Encounter (Signed)
Patient called asking to come in for IV fluids stating he feels weak and bad in general after his chemotherapy treatment Thursday. He is eating and drinking and denies dizziness. Plese advise

## 2021-06-20 ENCOUNTER — Other Ambulatory Visit: Payer: Self-pay

## 2021-06-20 ENCOUNTER — Telehealth: Payer: Self-pay | Admitting: *Deleted

## 2021-06-20 DIAGNOSIS — R5383 Other fatigue: Secondary | ICD-10-CM

## 2021-06-20 NOTE — Telephone Encounter (Signed)
Returned patient's call. Pt reports excessive fatigue for the past week, and diarrhea 2-3 times a day for the last 3-4 days. He reports that he received IV fluids the other day and does not feel much improvement. It was recommend that patient come in for labs and smc visit/poss IV fluids. Appointment offered for today, but pt unable to come today. Appointment scheduled for tomorrow AM. Pt aware of appointment time.

## 2021-06-20 NOTE — Telephone Encounter (Signed)
Patient states he got IV fluids the other day and feels like he needs more. Please advise

## 2021-06-21 ENCOUNTER — Inpatient Hospital Stay (HOSPITAL_BASED_OUTPATIENT_CLINIC_OR_DEPARTMENT_OTHER): Payer: PPO | Admitting: Hospice and Palliative Medicine

## 2021-06-21 ENCOUNTER — Inpatient Hospital Stay: Payer: PPO

## 2021-06-21 ENCOUNTER — Other Ambulatory Visit: Payer: Self-pay

## 2021-06-21 VITALS — BP 130/64 | HR 65 | Temp 98.3°F | Resp 16

## 2021-06-21 DIAGNOSIS — C3432 Malignant neoplasm of lower lobe, left bronchus or lung: Secondary | ICD-10-CM

## 2021-06-21 DIAGNOSIS — Z5112 Encounter for antineoplastic immunotherapy: Secondary | ICD-10-CM | POA: Diagnosis not present

## 2021-06-21 DIAGNOSIS — R5383 Other fatigue: Secondary | ICD-10-CM | POA: Diagnosis not present

## 2021-06-21 DIAGNOSIS — R197 Diarrhea, unspecified: Secondary | ICD-10-CM | POA: Diagnosis not present

## 2021-06-21 DIAGNOSIS — Z95828 Presence of other vascular implants and grafts: Secondary | ICD-10-CM

## 2021-06-21 LAB — COMPREHENSIVE METABOLIC PANEL
ALT: 15 U/L (ref 0–44)
AST: 16 U/L (ref 15–41)
Albumin: 4 g/dL (ref 3.5–5.0)
Alkaline Phosphatase: 44 U/L (ref 38–126)
Anion gap: 7 (ref 5–15)
BUN: 25 mg/dL — ABNORMAL HIGH (ref 8–23)
CO2: 24 mmol/L (ref 22–32)
Calcium: 8.6 mg/dL — ABNORMAL LOW (ref 8.9–10.3)
Chloride: 103 mmol/L (ref 98–111)
Creatinine, Ser: 0.88 mg/dL (ref 0.61–1.24)
GFR, Estimated: >60 mL/min (ref >60)
Glucose, Bld: 103 mg/dL — ABNORMAL HIGH (ref 70–99)
Potassium: 3.9 mmol/L (ref 3.5–5.1)
Sodium: 134 mmol/L — ABNORMAL LOW (ref 135–145)
Total Bilirubin: 0.7 mg/dL (ref 0.3–1.2)
Total Protein: 6.3 g/dL — ABNORMAL LOW (ref 6.5–8.1)

## 2021-06-21 LAB — CBC WITH DIFFERENTIAL/PLATELET
Abs Immature Granulocytes: 0.11 10*3/uL — ABNORMAL HIGH (ref 0.00–0.07)
Basophils Absolute: 0.1 10*3/uL (ref 0.0–0.1)
Basophils Relative: 1 %
Eosinophils Absolute: 0.1 10*3/uL (ref 0.0–0.5)
Eosinophils Relative: 1 %
HCT: 39.6 % (ref 39.0–52.0)
Hemoglobin: 13.5 g/dL (ref 13.0–17.0)
Immature Granulocytes: 1 %
Lymphocytes Relative: 9 %
Lymphs Abs: 0.9 10*3/uL (ref 0.7–4.0)
MCH: 31.9 pg (ref 26.0–34.0)
MCHC: 34.1 g/dL (ref 30.0–36.0)
MCV: 93.6 fL (ref 80.0–100.0)
Monocytes Absolute: 0.6 10*3/uL (ref 0.1–1.0)
Monocytes Relative: 6 %
Neutro Abs: 8.4 10*3/uL — ABNORMAL HIGH (ref 1.7–7.7)
Neutrophils Relative %: 82 %
Platelets: 179 10*3/uL (ref 150–400)
RBC: 4.23 MIL/uL (ref 4.22–5.81)
RDW: 12.8 % (ref 11.5–15.5)
WBC: 10.2 10*3/uL (ref 4.0–10.5)
nRBC: 0 % (ref 0.0–0.2)

## 2021-06-21 LAB — MAGNESIUM: Magnesium: 1.9 mg/dL (ref 1.7–2.4)

## 2021-06-21 MED ORDER — SODIUM CHLORIDE 0.9% FLUSH
10.0000 mL | Freq: Once | INTRAVENOUS | Status: DC
  Filled 2021-06-21: qty 10

## 2021-06-21 MED ORDER — HEPARIN SOD (PORK) LOCK FLUSH 100 UNIT/ML IV SOLN
500.0000 [IU] | Freq: Once | INTRAVENOUS | Status: DC
  Filled 2021-06-21: qty 5

## 2021-06-21 MED ORDER — DEXAMETHASONE SODIUM PHOSPHATE 10 MG/ML IJ SOLN
INTRAMUSCULAR | Status: AC
  Filled 2021-06-21: qty 1

## 2021-06-21 MED ORDER — DEXAMETHASONE SODIUM PHOSPHATE 10 MG/ML IJ SOLN
4.0000 mg | Freq: Once | INTRAMUSCULAR | Status: AC
  Administered 2021-06-21: 4 mg via INTRAVENOUS

## 2021-06-21 MED ORDER — SODIUM CHLORIDE 0.9 % IV SOLN: 4.0000 mg | Freq: Once | INTRAVENOUS | Status: DC

## 2021-06-21 MED ORDER — SODIUM CHLORIDE 0.9 % IV SOLN
INTRAVENOUS | Status: DC
  Filled 2021-06-21: qty 250

## 2021-06-21 NOTE — Progress Notes (Signed)
Symptom Management [place] at [facility] Telephone:([phone] [phone]  Patient Care Team: Randall Bush, MD as PCP - General (Family Medicine) [name] Randall November, MD as PCP - Cardiology (Cardiology) Randall Mc, MD (Internal Medicine) Randall Merritts, MD as Consulting Physician (Cardiology) Randall Partridge, DO as Consulting Physician (Neurology) Randall Sickle, MD as Consulting Physician (Hematology and Oncology) Randall Nab, RN as Oncology Nurse Navigator   Name of the patient: Randall Ali  [id number]  12/24/1940   Date of visit: 06/21/21  Reason for Consult:  Randall Ali is a 81 year old male with multiple medical problems including CAD status post CABG, PVD status post stenting, stage IV non-small cell lung cancer initially diagnosed May 2022 status post Carbo/Taxol now on maintenance Keytruda.  Patient has had previous and ongoing fatigue from [facility] requiring treatment holiday.  Patient last saw Dr. Rogue Bussing on 06/13/2021.  Patient had previously been on treatment holiday given fatigue from [place].  However, patient reported improved fatigue on prednisone 20 mg daily.  Patient was working in the gym.  Decision was made to restart Keytruda given aggressiveness of cancer.  Patient received Keytruda on 06/13/2021.  Dose of prednisone was weaned to 10 mg daily.  There was also question of possible adrenal insufficiency.  Patient received IV fluids and fluid clinic on 06/17/2018.  Patient presents to [facility] today for evaluation of worsening fatigue over the past week and diarrhea over the past several days.  Patient reports similar fatigue to past Keytruda.  He is still able to workout in the gym every morning but has less overall energy.  Reports sleeping well at night.  Denies fever or chills.  Denies nausea or vomiting.  He has had some loose stools chronically but reports slight increased frequency (2-3 daily) over the  past several days.  Stools are nonbloody but slightly watery.  Denies any neurologic complaints. Denies recent fevers or illnesses. Denies any easy bleeding or bruising. Reports good appetite and denies weight loss. Denies chest pain. Denies any nausea, vomiting, constipation.  Denies urinary complaints. Patient offers no further specific complaints today.  PAST MEDICAL HISTORY: Past Medical History:  Diagnosis Date   Allergy    seasonal   Arthritis    all over- in general    CAD (coronary artery disease)    a. inferior wall MI 10/01 s/p PCI/DES to RCA; b. Myoview 3/16 neg for ischemia; c. LHC 8/16: ostLAD 80%, OM1 70%, OM2 70% x 2 lesions, mRCA 30%, dRCA 70% s/p 4-V CABG 01/24/15 (LIMA-LAD, VG- OM1, VG-OM2, VG-PDA)    Cancer (HCC)    skin, melanoma   Carotid artery disease ([place])    a. [place] 8/16: 1-39% bilateral ICA stenosis   Cataract    removed   Diastolic dysfunction    a. TTE 8/16: EF 55-60%, no RWMA, Gr1DD, calcified mitral annulus, mild biatrial enlargement   Erectile dysfunction    GERD (gastroesophageal reflux disease)    History of elbow surgery    History of hiatal hernia    HLD (hyperlipidemia)    HTN (hypertension)    Inferior myocardial infarction ([place]) 03/2000   stent RCA   Lung cancer ([place])    Postoperative wound infection 02/02/2015   Reflux esophagitis    Sleep apnea 2017   CPAP at night    PAST SURGICAL HISTORY:  Past Surgical History:  Procedure Laterality Date   arm surgery  2010   BROW LIFT Bilateral 11/25/2019   Procedure: Delphina Cahill  PTOSIS REPAIR BILATERAL;  Surgeon: Karle Starch, MD;  Location: [facility];  Service: Ophthalmology;  Laterality: Bilateral;  sleep apnea   CARDIAC CATHETERIZATION  06/24/2011   CARDIAC CATHETERIZATION N/A 01/18/2015   Procedure: Left Heart Cath with coronary angiography;  Surgeon: Randall Merritts, MD;  Location: [facility];  Service: Cardiovascular;  Laterality: N/A;   CARDIAC CATHETERIZATION N/A 01/18/2015    Procedure: Intravascular Pressure Wire/FFR Study;  Surgeon: [name], MD;  Location: [facility];  Service: Cardiovascular;  Laterality: N/A;   CAROTID STENT  03/10/2011   COLONOSCOPY  2010   COLONOSCOPY  06/14/2014   Dr Hilarie Fredrickson   CORONARY ARTERY BYPASS GRAFT N/A 01/24/2015   Procedure: CORONARY ARTERY BYPASS GRAFTING x 4 (LIMA-LAD, SVG-Int 1- Int 2, SVG-PD) ENDOSCOPIC GREATER SAPHENOUS VEIN HARVEST LEFT LEG;  Surgeon: [name], MD;  Location: [facility];  Service: Open Heart Surgery;  Laterality: N/A;   EMBOLECTOMY  06/15/2019   Procedure: EMBOLECTOMY;  Surgeon: Katha Cabal, MD;  Location: [facility];  Service: Vascular;;  right superficial femoral artery   ENDARTERECTOMY FEMORAL Right 06/15/2019   Procedure: ENDARTERECTOMY FEMORAL;  Surgeon: Katha Cabal, MD;  Location: [facility];  Service: Vascular;  Laterality: Right;  common femoral profunda femoris superficial femoral   ESOPHAGOGASTRODUODENOSCOPY (EGD) WITH PROPOFOL N/A 04/24/2016   Procedure: ESOPHAGOGASTRODUODENOSCOPY (EGD) WITH PROPOFOL;  Surgeon: Jerene Bears, MD;  Location: [facility];  Service: Gastroenterology;  Laterality: N/A;   EYE SURGERY     lasik 15 yrs. ago, cataracts removed - both eyes    HAMMER TOE SURGERY     right toe   INSERTION OF ILIAC STENT Right 06/15/2019   Procedure: INSERTION OF ILIAC STENT ( STENTING OF SFA/POP ARTERY );  Surgeon: Katha Cabal, MD;  Location: [facility];  Service: Vascular;  Laterality: Right;  angioplpasty and stent placement: right superficial femoral right tibiopopliteal trunk bilateral common iliac arteries   IR IMAGING GUIDED PORT INSERTION  11/09/2020   LEFT HEART CATH AND CORONARY ANGIOGRAPHY Left 06/10/2017   Procedure: LEFT HEART CATH AND CORONARY ANGIOGRAPHY;  Surgeon: Randall Merritts, MD;  Location: [facility];  Service: Cardiovascular;  Laterality: Left;   LOWER EXTREMITY ANGIOGRAPHY Left 01/04/2019   Procedure: LOWER  EXTREMITY ANGIOGRAPHY;  Surgeon: Katha Cabal, MD;  Location: [facility];  Service: Cardiovascular;  Laterality: Left;   LOWER EXTREMITY ANGIOGRAPHY Right 01/25/2019   Procedure: LOWER EXTREMITY ANGIOGRAPHY;  Surgeon: Katha Cabal, MD;  Location: [facility];  Service: Cardiovascular;  Laterality: Right;   LOWER EXTREMITY ANGIOGRAPHY Right 01/15/2021   LOWER EXTREMITY ANGIOGRAPHY and stent placement to R SFA and popliteal artery Delana Meyer, Dolores Lory, MD)   NASAL SINUS SURGERY  2008   septpolasty, bilateral turbinate reduction   SHOULDER ARTHROSCOPY  2012   TEE WITHOUT CARDIOVERSION N/A 01/24/2015   Procedure: TRANSESOPHAGEAL ECHOCARDIOGRAM (TEE);  Surgeon: [name], MD;  Location: [facility];  Service: Open Heart Surgery;  Laterality: N/A;   TOE SURGERY  1994   UPPER GI ENDOSCOPY  07/2014, 04-24-16   Dr Raquel James   WRIST SURGERY  2011    HEMATOLOGY/ONCOLOGY HISTORY:  Oncology History Overview Note  #MAY 2022-Lung cancer-non-small cell [CT guided bx] T3N1 vs stage IV Dr.Hendrickson.  MRI brain negative for malignancy.# 1. April 2022- LLL ~4.0 cm mass in the superior segment left lower lobe abuts the major fissure and the posterior pleural surface without visible chest wall invasion  without pleural effusion. There 2-3 other small nodules in the left lower lobe, largest measures 9 mm in diameter. These are suspicious for same lobe satellite lesions and there is left hilar adenopathy. Assuming non-small cell lung cancer the appearance is compatible with T3 N1 M0 disease (stage IIIA).  # right suprarenal nodule-awaiting biopsy on 5/31- non-small cell [revived at tumor conference at [facility]  # June 9th 2022- CARBO-TAXOl-KEYTRUDA; Fulphila  # ? Subtle adrenal insufficiency- No acute process - -September MRI brain negative for any pituitary hypophysitis/brain metastasis. Prednisone   MOLECULAR TESTING: NGS TPS-PDL 100%; EXON 12 amplification* mutations.    # CAD  [CABG 2016; Dr.Gollan]   Cancer of lower lobe of left lung ([place])  11/02/2020 Initial Diagnosis   Cancer of lower lobe of left lung ([place])   11/02/2020 Cancer Staging   Staging form: Lung, AJCC 8th Edition - Clinical: Stage IVA (cT3, cN1, cM1a) - Signed by Randall Sickle, MD on 11/02/2020    11/16/2020 -  Chemotherapy   Patient is on Treatment Plan : LUNG NSCLC Carboplatin + Paclitaxel + Pembrolizumab q21d x 4 cycles / Pembrolizumab Maintenance Q21D       ALLERGIES:  is allergic to flomax [tamsulosin].  MEDICATIONS:  Current Outpatient Medications  Medication Sig Dispense Refill   acetaminophen (TYLENOL) 325 MG tablet Take 650 mg by mouth as needed for moderate pain or mild pain.     Ascorbic Acid (VITAMIN C) 1000 MG tablet Take 1,000 mg by mouth daily.     aspirin 81 MG EC tablet Take 81 mg by mouth daily.       Calcium-Magnesium-Vitamin D (CALCIUM 1200+D3 PO) Take 1 tablet by mouth daily.     Carboxymethylcellul-Glycerin (LUBRICATING EYE DROPS OP) Place 1 drop into both eyes daily as needed (irritation).     Cholecalciferol (VITAMIN D3) 50 MCG (2000 UT) TABS Take 2,000 Units by mouth daily.     clopidogrel (PLAVIX) 75 MG tablet Take 1 tablet (75 mg total) by mouth daily. (Patient not taking: Reported on 06/13/2021) 30 tablet 11   Cyanocobalamin (B-12) 5000 MCG CAPS Take 5,000 mcg by mouth daily.     docusate sodium (COLACE) 100 MG capsule Take 200 mg by mouth at bedtime.     erythromycin ophthalmic ointment SMARTSIG:In Eye(s) (Patient not taking: Reported on 06/13/2021)     lidocaine-prilocaine (EMLA) cream Apply 30 -45 mins prior to port access. 30 g 3   lisinopril (ZESTRIL) 5 MG tablet TAKE 1 TABLET BY MOUTH DAILY 90 tablet 0   Melatonin 10 MG CAPS Take 10 mg by mouth at bedtime as needed (sleep).     mupirocin cream (BACTROBAN) 2 % Apply 1 application topically 2 (two) times daily. (Patient not taking: Reported on 06/13/2021) 15 g 0   Niacinamide-Zn-Cu-Methfo-Se-Cr (NICOTINAMIDE  PO) Take 500 mg by mouth daily. (Patient not taking: Reported on 06/13/2021)     nitroGLYCERIN (NITROSTAT) 0.4 MG SL tablet Place 1 tablet (0.4 mg total) under the tongue every 5 (five) minutes as needed for up to 25 doses for chest pain. 25 tablet 1   ondansetron (ZOFRAN) 8 MG tablet One pill every 8 hours as needed for nausea/vomitting. (Patient not taking: Reported on 05/09/2021) 40 tablet 1   pantoprazole (PROTONIX) 40 MG tablet TAKE 1 TABLET BY MOUTH DAILY (Patient taking differently: Take 40 mg by mouth daily.) 90 tablet 3   Polyethyl Glycol-Propyl Glycol (LUBRICANT EYE DROPS) 0.4-0.3 % SOLN Place 1-2 drops into both eyes 3 (three) times daily as needed (  burning eyes.).     predniSONE (DELTASONE) 20 MG tablet Take 1/2 a pill. Once a day with food. 40 tablet 0   prochlorperazine (COMPAZINE) 10 MG tablet Take 1 tablet (10 mg total) by mouth every 6 (six) hours as needed for nausea or vomiting. 40 tablet 1   simvastatin (ZOCOR) 40 MG tablet TAKE 1 TABLET BY MOUTH AT BEDTIME 90 tablet 0   traMADol (ULTRAM) 50 MG tablet TAKE 1 TABLET BY MOUTH 3 TIMES DAILY AS NEEDED FOR MODERATE PAIN 90 tablet 0   triamcinolone ointment (KENALOG) 0.5 %  (Patient not taking: Reported on 06/13/2021)     Vitamin E 450 MG (1000 UT) CAPS Take 450 Units by mouth daily.     Zinc 50 MG TABS Take 50 mg by mouth daily.     No current facility-administered medications for this visit.   Facility-Administered Medications Ordered in Other Visits  Medication Dose Route Frequency Provider Last Rate Last Admin   heparin lock flush 100 UNIT/ML injection             VITAL SIGNS: Temp 98.3 F (36.8 C) (Tympanic)  There were no vitals filed for this visit.  Estimated body mass index is 32.36 kg/m as calculated from the following:   Height as of 06/13/21: _0  (1.727 m).   Weight as of 06/13/21: 212 lb 12.8 oz (96.5 kg).  LABS: CBC:    Component Value Date/Time   WBC 7.9 06/13/2021 0856   HGB 13.7 06/13/2021 0856   HGB 14.8  01/15/2015 0956   HCT 40.7 06/13/2021 0856   HCT 44.2 01/15/2015 0956   PLT 157 06/13/2021 0856   PLT 207 01/15/2015 0956   MCV 94.9 06/13/2021 0856   MCV 91 01/15/2015 0956   NEUTROABS 5.3 06/13/2021 0856   NEUTROABS 5.2 01/15/2015 0956   LYMPHSABS 1.2 06/13/2021 0856   LYMPHSABS 1.9 01/15/2015 0956   MONOABS 1.1 (H) 06/13/2021 0856   EOSABS 0.2 06/13/2021 0856   EOSABS 0.2 01/15/2015 0956   BASOSABS 0.0 06/13/2021 0856   BASOSABS 0.0 01/15/2015 0956   Comprehensive Metabolic Panel:    Component Value Date/Time   NA 137 06/13/2021 0856   NA 138 01/15/2015 0956   K 3.5 06/13/2021 0856   CL 103 06/13/2021 0856   CO2 25 06/13/2021 0856   BUN 17 06/13/2021 0856   BUN 16 01/15/2015 0956   CREATININE 1.05 06/13/2021 0856   GLUCOSE 89 06/13/2021 0856   CALCIUM 9.0 06/13/2021 0856   AST 18 06/13/2021 0856   ALT 18 06/13/2021 0856   ALKPHOS 42 06/13/2021 0856   BILITOT 0.6 06/13/2021 0856   PROT 6.3 (L) 06/13/2021 0856   ALBUMIN 3.7 06/13/2021 0856    RADIOGRAPHIC STUDIES: ECHOCARDIOGRAM COMPLETE  Result Date: 05/27/2021    ECHOCARDIOGRAM REPORT   Patient Name:   Arwin Bisceglia. Date of Exam: 05/27/2021 Medical Rec #:  [id number]           Height:       68.0 in Accession #:    [id number]          Weight:       213.0 lb Date of Birth:  10/12/1940           BSA:          2.099 m Patient Age:    21 years            BP:           105/66 mmHg  Patient Gender: M                   HR:           67 bpm. Exam Location:  [facility] Procedure: 2D Echo, Cardiac Doppler, Color Doppler, 3D Echo and Strain Analysis Indications:    R06.02 SOB  History:        Patient has prior history of Echocardiogram examinations, most                 recent 04/18/2018. CAD, Prior CABG, PAD; Risk Factors:Sleep                 Apnea, Dyslipidemia and Hypertension.  Sonographer:    Hester Mates BS, RVT, RDCS Referring Phys: [phone] [place]  1. Left ventricular ejection fraction, by estimation, is  50 to 55%. The left ventricle has low normal function. The left ventricle has no regional wall motion abnormalities. Left ventricular diastolic parameters are consistent with Grade I diastolic dysfunction (impaired relaxation). The average left ventricular global longitudinal strain is -16.8 %.  2. Right ventricular systolic function is normal. The right ventricular size is normal.  3. Right atrial size was mildly dilated.  4. The mitral valve is normal in structure. No evidence of mitral valve regurgitation.  5. The aortic valve is calcified. Aortic valve regurgitation is not visualized. Aortic valve sclerosis is present, with no evidence of aortic valve stenosis.  6. The inferior vena cava is normal in size with greater than 50% respiratory variability, suggesting right atrial pressure of 3 mmHg. Comparison(s): Previous Echo showed LV EF 55-60%, no RWMA, MAC and mild MR. FINDINGS  Left Ventricle: Left ventricular ejection fraction, by estimation, is 50 to 55%. The left ventricle has low normal function. The left ventricle has no regional wall motion abnormalities. The average left ventricular global longitudinal strain is -16.8 %. 3D left ventricular ejection fraction analysis performed but not reported based on interpreter judgement due to suboptimal tracking. The left ventricular internal cavity size was normal in size. There is no left ventricular hypertrophy. Left ventricular diastolic parameters are consistent with Grade I diastolic dysfunction (impaired relaxation). Right Ventricle: The right ventricular size is normal. No increase in right ventricular wall thickness. Right ventricular systolic function is normal. Left Atrium: Left atrial size was normal in size. Right Atrium: Right atrial size was mildly dilated. Pericardium: There is no evidence of pericardial effusion. Mitral Valve: The mitral valve is normal in structure. No evidence of mitral valve regurgitation. Tricuspid Valve: The tricuspid valve is  normal in structure. Tricuspid valve regurgitation is mild. Aortic Valve: The aortic valve is calcified. Aortic valve regurgitation is not visualized. Aortic valve sclerosis is present, with no evidence of aortic valve stenosis. Pulmonic Valve: The pulmonic valve was normal in structure. Pulmonic valve regurgitation is mild. Aorta: The aortic root and ascending aorta are structurally normal, with no evidence of dilitation. Venous: The inferior vena cava is normal in size with greater than 50% respiratory variability, suggesting right atrial pressure of 3 mmHg. IAS/Shunts: No atrial level shunt detected by color flow Doppler.  LEFT VENTRICLE PLAX 2D LVIDd:         5.50 cm     Diastology LVIDs:         4.40 cm     LV e' medial:    6.53 cm/s LV PW:         1.00 cm     LV E/e' medial:  10.3 LV IVS:  1.10 cm     LV e' lateral:   10.60 cm/s LVOT diam:     2.30 cm     LV E/e' lateral: 6.3 LV SV:         94 LV SV Index:   45          2D Longitudinal Strain LVOT Area:     4.15 cm    2D Strain GLS Avg:     -16.8 %  LV Volumes (MOD) LV vol d, MOD A2C: 76.7 ml 3D Volume EF: LV vol d, MOD A4C: 92.3 ml 3D EF:        55 % LV vol s, MOD A2C: 26.4 ml LV EDV:       142 ml LV vol s, MOD A4C: 29.3 ml LV ESV:       63 ml LV SV MOD A2C:     50.3 ml LV SV:        79 ml LV SV MOD A4C:     92.3 ml LV SV MOD BP:      57.4 ml RIGHT VENTRICLE RV S prime:     8.05 cm/s TAPSE (M-mode): 1.4 cm LEFT ATRIUM           Index        RIGHT ATRIUM           Index LA diam:      4.20 cm 2.00 cm/m   RA Area:     24.90 cm LA Vol (A4C): 70.9 ml 33.78 ml/m  RA Volume:   94.10 ml  44.83 ml/m  AORTIC VALVE             PULMONIC VALVE LVOT Vmax:   102.00 cm/s PV Vmax:       1.00 m/s LVOT Vmean:  71.300 cm/s PV Peak grad:  4.0 mmHg LVOT VTI:    0.226 m  AORTA Ao Root diam: 3.80 cm Ao Asc diam:  3.50 cm MITRAL VALVE MV Area (PHT): 2.46 cm     SHUNTS MV Decel Time: 309 msec     Systemic VTI:  0.23 m MV E velocity: 67.30 cm/s   Systemic Diam: 2.30 cm MV A  velocity: 113.00 cm/s MV E/A ratio:  0.60 Kate Sable MD Electronically signed by Kate Sable MD Signature Date/Time: 05/27/2021/12:44:42 PM    Final     PERFORMANCE STATUS (ECOG) : 1 - Symptomatic but completely ambulatory  Review of Systems Unless otherwise noted, a complete review of systems is negative.  Physical Exam General: NAD Cardiovascular: regular rate and rhythm Pulmonary: clear ant fields Abdomen: soft, nontender, + bowel sounds GU: no suprapubic tenderness Extremities: no edema, no joint deformities Skin: no rashes Neurological: Weakness but otherwise nonfocal  Assessment and Plan- Patient is a 81 y.o. male with multiple medical problems including CAD status post CABG, PVD status post stenting, stage IV non-small cell lung cancer initially diagnosed May 2022 status post Carbo/Taxol now on maintenance Keytruda.  Patient has had previous and ongoing fatigue from [facility] requiring treatment holiday.  Patient presents to [facility] for evaluation of fatigue and diarrhea   Fatigue -this is likely secondary to [facility] +/- adrenal insufficiency.  Patient is on prednisone 10 mg daily.  We will proceed with IV fluids and give him dexamethasone 4 mg x 1 in clinic today.  Recommended that he trial American ginseng 2000 mg daily for cancer fatigue.  Diarrhea -unclear etiology.  Doubt immunotherapy related colitis.  Labs unremarkable. Slight hyponatremia could be secondary  to fluid deficit. Will proceed with IVFs today. Will send for stool studies/C. difficile PCR.  Can proceed with OTC antidiarrheals if stool studies negative.   Case and plan discussed with Dr. Rogue Bussing   Patient expressed understanding and was in agreement with this plan. He also understands that He can call clinic at any time with any questions, concerns, or complaints.   Thank you for allowing me to participate in the care of this very pleasant patient.   Time Total: 15 minutes  Visit consisted of  counseling and education dealing with the complex and emotionally intense issues of symptom management in the setting of serious illness.Greater than 50%  of this time was spent counseling and coordinating care related to the above assessment and plan.  Signed by: Altha Harm, PhD, NP-C

## 2021-06-24 ENCOUNTER — Other Ambulatory Visit: Payer: Self-pay

## 2021-06-24 DIAGNOSIS — Z5112 Encounter for antineoplastic immunotherapy: Secondary | ICD-10-CM | POA: Diagnosis not present

## 2021-06-24 DIAGNOSIS — R197 Diarrhea, unspecified: Secondary | ICD-10-CM

## 2021-06-24 LAB — C DIFFICILE QUICK SCREEN W PCR REFLEX
C Diff antigen: NEGATIVE
C Diff interpretation: NOT DETECTED
C Diff toxin: NEGATIVE

## 2021-06-25 DIAGNOSIS — C44729 Squamous cell carcinoma of skin of left lower limb, including hip: Secondary | ICD-10-CM | POA: Diagnosis not present

## 2021-06-25 DIAGNOSIS — L905 Scar conditions and fibrosis of skin: Secondary | ICD-10-CM | POA: Diagnosis not present

## 2021-06-28 LAB — STOOL CULTURE: E coli, Shiga toxin Assay: NEGATIVE

## 2021-06-28 LAB — STOOL CULTURE REFLEX - CMPCXR

## 2021-06-28 LAB — STOOL CULTURE REFLEX - RSASHR

## 2021-07-02 ENCOUNTER — Telehealth: Payer: Self-pay | Admitting: *Deleted

## 2021-07-02 DIAGNOSIS — Z20822 Contact with and (suspected) exposure to covid-19: Secondary | ICD-10-CM | POA: Diagnosis not present

## 2021-07-02 DIAGNOSIS — Z03818 Encounter for observation for suspected exposure to other biological agents ruled out: Secondary | ICD-10-CM | POA: Diagnosis not present

## 2021-07-02 NOTE — Telephone Encounter (Signed)
Patient called reporting that he feels bad and thinks he either has allergies or a cold since Friday He would like something to be done. He has no fever, he is coughing and is congested. He has not been exposed to anyone with Flu or COVID. He has been taking over the counter Zyrtec for congestion. He took Claritin for a week . He states he has had congestion for a week, but it got bad Friday. His last treatment was 06/13/21. He has not been tested for COVID. Please advise

## 2021-07-02 NOTE — Telephone Encounter (Signed)
Pt has been contacted and will come by to pick up Rx for testing. He will call us back after he obtains his test results and we will schedule virtual visit at that time.

## 2021-07-03 ENCOUNTER — Telehealth: Payer: Self-pay | Admitting: *Deleted

## 2021-07-03 NOTE — Telephone Encounter (Signed)
Patient valled reporting that his COVID and Flu tests came back negative. He states he does feel better today but he is very congested and doe snot know if it is allergies r a cold and is asking fr a return call to let him know what to do.

## 2021-07-03 NOTE — Telephone Encounter (Signed)
Called and offered pt a virtual appointment, but he states that he is feeling better, and he will just "see how it goes". He denies fever. He stated that he has an appointment for treatment tomorrow, but will let us know if he has any worsening of symptoms or develops fever.

## 2021-07-04 ENCOUNTER — Inpatient Hospital Stay: Payer: PPO

## 2021-07-04 ENCOUNTER — Other Ambulatory Visit: Payer: Self-pay

## 2021-07-04 ENCOUNTER — Encounter: Payer: Self-pay | Admitting: Internal Medicine

## 2021-07-04 ENCOUNTER — Ambulatory Visit
Admission: RE | Admit: 2021-07-04 | Discharge: 2021-07-04 | Disposition: A | Discharge status: Home / Self Care | Payer: PPO | Source: Ambulatory Visit | Attending: Internal Medicine | Admitting: Internal Medicine

## 2021-07-04 ENCOUNTER — Inpatient Hospital Stay: Payer: PPO | Admitting: Internal Medicine

## 2021-07-04 ENCOUNTER — Ambulatory Visit
Admission: RE | Admit: 2021-07-04 | Discharge: 2021-07-04 | Disposition: A | Discharge status: Home / Self Care | Payer: PPO | Attending: Internal Medicine | Admitting: Internal Medicine

## 2021-07-04 DIAGNOSIS — C3432 Malignant neoplasm of lower lobe, left bronchus or lung: Secondary | ICD-10-CM

## 2021-07-04 DIAGNOSIS — Z5112 Encounter for antineoplastic immunotherapy: Secondary | ICD-10-CM | POA: Diagnosis not present

## 2021-07-04 DIAGNOSIS — R0602 Shortness of breath: Secondary | ICD-10-CM | POA: Diagnosis not present

## 2021-07-04 DIAGNOSIS — R059 Cough, unspecified: Secondary | ICD-10-CM | POA: Diagnosis not present

## 2021-07-04 LAB — CBC WITH DIFFERENTIAL/PLATELET
Abs Immature Granulocytes: 0.12 10*3/uL — ABNORMAL HIGH (ref 0.00–0.07)
Basophils Absolute: 0.1 10*3/uL (ref 0.0–0.1)
Basophils Relative: 1 %
Eosinophils Absolute: 0.2 10*3/uL (ref 0.0–0.5)
Eosinophils Relative: 1 %
HCT: 40.3 % (ref 39.0–52.0)
Hemoglobin: 13.8 g/dL (ref 13.0–17.0)
Immature Granulocytes: 1 %
Lymphocytes Relative: 10 %
Lymphs Abs: 1.3 10*3/uL (ref 0.7–4.0)
MCH: 32 pg (ref 26.0–34.0)
MCHC: 34.2 g/dL (ref 30.0–36.0)
MCV: 93.5 fL (ref 80.0–100.0)
Monocytes Absolute: 1.1 10*3/uL — ABNORMAL HIGH (ref 0.1–1.0)
Monocytes Relative: 9 %
Neutro Abs: 10.1 10*3/uL — ABNORMAL HIGH (ref 1.7–7.7)
Neutrophils Relative %: 78 %
Platelets: 189 10*3/uL (ref 150–400)
RBC: 4.31 MIL/uL (ref 4.22–5.81)
RDW: 12.5 % (ref 11.5–15.5)
WBC: 12.9 10*3/uL — ABNORMAL HIGH (ref 4.0–10.5)
nRBC: 0 % (ref 0.0–0.2)

## 2021-07-04 LAB — COMPREHENSIVE METABOLIC PANEL
ALT: 14 U/L (ref 0–44)
AST: 16 U/L (ref 15–41)
Albumin: 4 g/dL (ref 3.5–5.0)
Alkaline Phosphatase: 44 U/L (ref 38–126)
Anion gap: 11 (ref 5–15)
BUN: 19 mg/dL (ref 8–23)
CO2: 24 mmol/L (ref 22–32)
Calcium: 9.6 mg/dL (ref 8.9–10.3)
Chloride: 104 mmol/L (ref 98–111)
Creatinine, Ser: 0.94 mg/dL (ref 0.61–1.24)
GFR, Estimated: >60 mL/min (ref >60)
Glucose, Bld: 110 mg/dL — ABNORMAL HIGH (ref 70–99)
Potassium: 3.7 mmol/L (ref 3.5–5.1)
Sodium: 139 mmol/L (ref 135–145)
Total Bilirubin: 0.8 mg/dL (ref 0.3–1.2)
Total Protein: 7 g/dL (ref 6.5–8.1)

## 2021-07-04 MED ORDER — SODIUM CHLORIDE 0.9% FLUSH
10.0000 mL | Freq: Once | INTRAVENOUS | Status: AC
  Administered 2021-07-04: 10 mL via INTRAVENOUS
  Filled 2021-07-04: qty 10

## 2021-07-04 MED ORDER — MONTELUKAST SODIUM 10 MG PO TABS: 10.0000 mg | ORAL_TABLET | Freq: Every day | ORAL | 1 refills | Status: DC

## 2021-07-04 MED ORDER — DEXAMETHASONE SODIUM PHOSPHATE 10 MG/ML IJ SOLN
4.0000 mg | Freq: Once | INTRAMUSCULAR | Status: AC
  Administered 2021-07-04: 4 mg via INTRAVENOUS
  Filled 2021-07-04: qty 1

## 2021-07-04 MED ORDER — SODIUM CHLORIDE 0.9 % IV SOLN
Freq: Once | INTRAVENOUS | Status: AC
  Filled 2021-07-04: qty 250

## 2021-07-04 MED ORDER — HEPARIN SOD (PORK) LOCK FLUSH 100 UNIT/ML IV SOLN
INTRAVENOUS | Status: AC
  Administered 2021-07-04: 500 [IU]
  Filled 2021-07-04: qty 5

## 2021-07-04 NOTE — Patient Instructions (Addendum)
#   recommend patanol eye drops- Over the counter twice a day.   # use singular as prescribed along with Zyrtec.  Continue Flonase  # Chest x-ray today

## 2021-07-04 NOTE — Progress Notes (Signed)
Patient has congestion, cough, runny nose, and watery eyes for the past.  Not sure if allergy related but does not have history of allergy problems.    Increasing fatigue started last week.  When was previously fatigued the IVF and steroids did help his energy level increase.    Has 2 loose stools a day for the last 2-3 weeks.

## 2021-07-04 NOTE — Progress Notes (Signed)
[facility] NOTE  Patient Care Team: Ria Bush, MD as PCP - General (Family Medicine) [name] [name], MD as PCP - Cardiology (Cardiology) [name], MD (Internal Medicine) Minna Merritts, MD as Consulting Physician (Cardiology) Pieter Partridge, DO as Consulting Physician (Neurology) Cammie Sickle, MD as Consulting Physician (Hematology and Oncology) Telford Nab, RN as Oncology Nurse Navigator  CHIEF COMPLAINTS/PURPOSE OF CONSULTATION: lung cancer    Oncology History Overview Note  #MAY 2022-Lung cancer-non-small cell [CT guided bx] T3N1 vs stage IV Dr.Hendrickson.  MRI brain negative for malignancy.# 1. April 2022- LLL ~4.0 cm mass in the superior segment left lower lobe abuts the major fissure and the posterior pleural surface without visible chest wall invasion without pleural effusion. There 2-3 other small nodules in the left lower lobe, largest measures 9 mm in diameter. These are suspicious for same lobe satellite lesions and there is left hilar adenopathy. Assuming non-small cell lung cancer the appearance is compatible with T3 N1 M0 disease (stage IIIA).  # right suprarenal nodule-awaiting biopsy on 5/31- non-small cell [revived at tumor conference at [place]  # June 9th 2022- CARBO-TAXOl-KEYTRUDA; Fulphila  # ? Subtle adrenal insufficiency- No acute process - -September MRI brain negative for any pituitary hypophysitis/brain metastasis. Prednisone   MOLECULAR TESTING: NGS TPS-PDL 100%; EXON 12 amplification* mutations.    # CAD [CABG 2016; Dr.Gollan]   Cancer of lower lobe of left lung ([place])  11/02/2020 Initial Diagnosis   Cancer of lower lobe of left lung ([place])   11/02/2020 Cancer Staging   Staging form: Lung, AJCC 8th Edition - Clinical: Stage IVA (cT3, cN1, cM1a) - Signed by Cammie Sickle, MD on 11/02/2020    11/16/2020 -  Chemotherapy   Patient is on Treatment Plan : LUNG NSCLC Carboplatin + Paclitaxel +  Pembrolizumab q21d x 4 cycles / Pembrolizumab Maintenance Q21D        HISTORY OF PRESENTING ILLNESS: Alone.  Walking independently.  [name] 81 y.o.  male lung cancer-non-small cell stage IV [supra-renal nodule s/p biopsy] currently on Keytruda is for follow-up.  Patient had intermittent treatment break from [facility] because of ongoing fatigue.  Patient continues to feel poorly.  Complains of allergic symptoms rhinitis/nasal congestion.  Mild shortness of breath otherwise no fever no chills.  No nausea no vomiting.  No headaches.  Review of Systems  Constitutional:  Positive for malaise/fatigue. Negative for chills, diaphoresis, fever and weight loss.  HENT:  Negative for nosebleeds and sore throat.   Eyes:  Negative for double vision.  Respiratory:  Negative for cough, hemoptysis, sputum production, shortness of breath and wheezing.   Cardiovascular:  Negative for chest pain, palpitations, orthopnea and leg swelling.  Gastrointestinal:  Negative for abdominal pain, blood in stool, constipation, diarrhea, heartburn, melena, nausea and vomiting.  Genitourinary:  Negative for dysuria, frequency and urgency.  Musculoskeletal:  Positive for back pain and joint pain.  Skin: Negative.  Negative for itching and rash.  Neurological:  Negative for dizziness, tingling, focal weakness, weakness and headaches.  Endo/Heme/Allergies:  Does not bruise/bleed easily.  Psychiatric/Behavioral:  Negative for depression. The patient is not nervous/anxious and does not have insomnia.     MEDICAL HISTORY:  Past Medical History:  Diagnosis Date   Allergy    seasonal   Arthritis    all over- in general    CAD (coronary artery disease)    a. inferior wall MI 10/01 s/p PCI/DES to RCA; b. Myoview 3/16 neg  for ischemia; c. LHC 8/16: ostLAD 80%, OM1 70%, OM2 70% x 2 lesions, mRCA 30%, dRCA 70% s/p 4-V CABG 01/24/15 (LIMA-LAD, VG- OM1, VG-OM2, VG-PDA)    Cancer (HCC)    skin, melanoma   Carotid  artery disease ([place])    a. [place] 8/16: 1-39% bilateral ICA stenosis   Cataract    removed   Diastolic dysfunction    a. TTE 8/16: EF 55-60%, no RWMA, Gr1DD, calcified mitral annulus, mild biatrial enlargement   Erectile dysfunction    GERD (gastroesophageal reflux disease)    History of elbow surgery    History of hiatal hernia    HLD (hyperlipidemia)    HTN (hypertension)    Inferior myocardial infarction ([place]) 03/2000   stent RCA   Lung cancer ([place])    Postoperative wound infection 02/02/2015   Reflux esophagitis    Sleep apnea 2017   CPAP at night    SURGICAL HISTORY: Past Surgical History:  Procedure Laterality Date   arm surgery  2010   BROW LIFT Bilateral 11/25/2019   Procedure: BROW PTOSIS REPAIR BILATERAL;  Surgeon: Karle Starch, MD;  Location: [facility];  Service: Ophthalmology;  Laterality: Bilateral;  sleep apnea   CARDIAC CATHETERIZATION  06/24/2011   CARDIAC CATHETERIZATION N/A 01/18/2015   Procedure: Left Heart Cath with coronary angiography;  Surgeon: Minna Merritts, MD;  Location: [place] [facility];  Service: Cardiovascular;  Laterality: N/A;   CARDIAC CATHETERIZATION N/A 01/18/2015   Procedure: Intravascular Pressure Wire/FFR Study;  Surgeon: [name], MD;  Location: [place] [facility];  Service: Cardiovascular;  Laterality: N/A;   CAROTID STENT  03/10/2011   COLONOSCOPY  2010   COLONOSCOPY  06/14/2014   Dr Hilarie Fredrickson   CORONARY ARTERY BYPASS GRAFT N/A 01/24/2015   Procedure: CORONARY ARTERY BYPASS GRAFTING x 4 (LIMA-LAD, SVG-Int 1- Int 2, SVG-PD) ENDOSCOPIC GREATER SAPHENOUS VEIN HARVEST LEFT LEG;  Surgeon: [name], MD;  Location: [facility];  Service: Open Heart Surgery;  Laterality: N/A;   EMBOLECTOMY  06/15/2019   Procedure: EMBOLECTOMY;  Surgeon: Katha Cabal, MD;  Location: [facility];  Service: Vascular;;  right superficial femoral artery   ENDARTERECTOMY FEMORAL Right 06/15/2019   Procedure: ENDARTERECTOMY FEMORAL;   Surgeon: Katha Cabal, MD;  Location: [facility];  Service: Vascular;  Laterality: Right;  common femoral profunda femoris superficial femoral   ESOPHAGOGASTRODUODENOSCOPY (EGD) WITH PROPOFOL N/A 04/24/2016   Procedure: ESOPHAGOGASTRODUODENOSCOPY (EGD) WITH PROPOFOL;  Surgeon: Jerene Bears, MD;  Location[facility];  Service: Gastroenterology;  Laterality: N/A;   EYE SURGERY     lasik 15 yrs. ago, cataracts removed - both eyes    HAMMER TOE SURGERY     right toe   INSERTION OF ILIAC STENT Right 06/15/2019   Procedure: INSERTION OF ILIAC STENT ( STENTING OF SFA/POP ARTERY );  Surgeon: Katha Cabal, MD;  Location: [facility];  Service: Vascular;  Laterality: Right;  angioplpasty and stent placement: right superficial femoral right tibiopopliteal trunk bilateral common iliac arteries   IR IMAGING GUIDED PORT INSERTION  11/09/2020   LEFT HEART CATH AND CORONARY ANGIOGRAPHY Left 06/10/2017   Procedure: LEFT HEART CATH AND CORONARY ANGIOGRAPHY;  Surgeon: Minna Merritts, MD;  Location: [facility];  Service: Cardiovascular;  Laterality: Left;   LOWER EXTREMITY ANGIOGRAPHY Left 01/04/2019   Procedure: LOWER EXTREMITY ANGIOGRAPHY;  Surgeon: Katha Cabal, MD;  Location: [facility];  Service: Cardiovascular;  Laterality: Left;   LOWER EXTREMITY  ANGIOGRAPHY Right 01/25/2019   Procedure: LOWER EXTREMITY ANGIOGRAPHY;  Surgeon: Katha Cabal, MD;  Location: [facility];  Service: Cardiovascular;  Laterality: Right;   LOWER EXTREMITY ANGIOGRAPHY Right 01/15/2021   LOWER EXTREMITY ANGIOGRAPHY and stent placement to R SFA and popliteal artery Delana Meyer, Dolores Lory, MD)   NASAL SINUS SURGERY  2008   septpolasty, bilateral turbinate reduction   SHOULDER ARTHROSCOPY  2012   TEE WITHOUT CARDIOVERSION N/A 01/24/2015   Procedure: TRANSESOPHAGEAL ECHOCARDIOGRAM (TEE);  Surgeon: [name], MD;  Location: [facility];  Service: Open Heart Surgery;  Laterality:  N/A;   TOE SURGERY  1994   UPPER GI ENDOSCOPY  07/2014, 04-24-16   Dr Raquel James   WRIST SURGERY  2011    SOCIAL HISTORY: Social History   Socioeconomic History   Marital status: Single    Spouse name: Not on file   Number of children: Not on file   Years of education: 12   Highest education level: Not on file  Occupational History   Occupation: retired    Comment: [employer]  Tobacco Use   Smoking status: Former    Packs/day: 2.50    Years: 40.00    Pack years: 100.00    Types: Cigarettes    Quit date: 03/24/2000    Years since quitting: 21.3   Smokeless tobacco: Never  Vaping Use   Vaping Use: Never used  Substance and Sexual Activity   Alcohol use: Yes    Comment: occassionally   Drug use: No   Sexual activity: Yes  Other Topics Concern   Not on file  Social History Narrative   Singled; lives with son and dog    Occ: retired, back part time at [employer];    Activity: gym 4-5d/wk   Diet: good water, fruits/vegetables daily   Caffeine Use-yes      ------------------------------------       Car sales- retd; [employer]- retd; quit smoking 2001. Alcohol couple nights a week. Live in [place]. Daughter lives 10 mins.    Social Determinants of Health   Financial Resource Strain: Not on file  Food Insecurity: Not on file  Transportation Needs: Not on file  Physical Activity: Not on file  Stress: Not on file  Social Connections: Not on file  Intimate Partner Violence: Not on file    FAMILY HISTORY: Family History  Problem Relation Age of Onset   Hypertension Mother    Heart disease Mother    Hypertension Father    Diabetes Father    Lymphoma Sister    Heart disease Brother 64   Cancer Paternal Grandfather    Colon cancer Neg Hx    Prostate cancer Neg Hx    Bladder Cancer Neg Hx    Kidney cancer Neg Hx     ALLERGIES:  is allergic to flomax [tamsulosin].  MEDICATIONS:  Current Outpatient Medications  Medication Sig Dispense Refill   acetaminophen  (TYLENOL) 325 MG tablet Take 650 mg by mouth as needed for moderate pain or mild pain.     Ascorbic Acid (VITAMIN C) 1000 MG tablet Take 1,000 mg by mouth daily.     aspirin 81 MG EC tablet Take 81 mg by mouth daily.       Calcium-Magnesium-Vitamin D (CALCIUM 1200+D3 PO) Take 1 tablet by mouth daily.     Carboxymethylcellul-Glycerin (LUBRICATING EYE DROPS OP) Place 1 drop into both eyes daily as needed (irritation).     Cholecalciferol (VITAMIN D3) 50 MCG (2000 UT)  TABS Take 2,000 Units by mouth daily.     Cyanocobalamin (B-12) 5000 MCG CAPS Take 5,000 mcg by mouth daily.     docusate sodium (COLACE) 100 MG capsule Take 200 mg by mouth at bedtime.     lidocaine-prilocaine (EMLA) cream Apply 30 -45 mins prior to port access. 30 g 3   Melatonin 10 MG CAPS Take 10 mg by mouth at bedtime as needed (sleep).     montelukast (SINGULAIR) 10 MG tablet Take 1 tablet (10 mg total) by mouth at bedtime. (Patient not taking: Reported on 07/25/2021) 30 tablet 1   nitroGLYCERIN (NITROSTAT) 0.4 MG SL tablet Place 1 tablet (0.4 mg total) under the tongue every 5 (five) minutes as needed for up to 25 doses for chest pain. 25 tablet 1   predniSONE (DELTASONE) 20 MG tablet Take 1/2 a pill. Once a day with food. 40 tablet 0   simvastatin (ZOCOR) 40 MG tablet TAKE 1 TABLET BY MOUTH AT BEDTIME 90 tablet 0   Vitamin E 450 MG (1000 UT) CAPS Take 450 Units by mouth daily.     Zinc 50 MG TABS Take 50 mg by mouth daily.     albuterol (VENTOLIN HFA) 108 (90 Base) MCG/ACT inhaler Inhale 2 puffs into the lungs every 6 (six) hours as needed for wheezing or shortness of breath. 8 g 2   chlorpheniramine-HYDROcodone 10-8 MG/5ML Take 5 mLs by mouth every 12 (twelve) hours as needed for cough. 115 mL 0   doxycycline (VIBRA-TABS) 100 MG tablet Take 100 mg by mouth 2 (two) times daily. (Patient not taking: Reported on 07/25/2021)     fluticasone-salmeterol (ADVAIR HFA) 115-21 MCG/ACT inhaler Inhale 2 puffs into the lungs 2 (two) times  daily. 1 each 12   levofloxacin (LEVAQUIN) 500 MG tablet Take 1 tablet (500 mg total) by mouth daily. 10 tablet 0   lisinopril (ZESTRIL) 5 MG tablet Take 1 tablet (5 mg total) by mouth daily. 90 tablet 3   pantoprazole (PROTONIX) 40 MG tablet TAKE 1 TABLET BY MOUTH DAILY 90 tablet 3   predniSONE (DELTASONE) 20 MG tablet Take 3 tablets (60 mg total) by mouth daily with breakfast. 3 pills once a day with breakfast-do not stop until next visit/1 week 60 tablet 0   prochlorperazine (COMPAZINE) 10 MG tablet Take 1 tablet (10 mg total) by mouth every 6 (six) hours as needed for nausea or vomiting. 40 tablet 1   traMADol (ULTRAM) 50 MG tablet TAKE 1 TABLET BY MOUTH 3 TIMES DAILY AS NEEDED FOR MODERATE PAIN 90 tablet 0   triamcinolone ointment (KENALOG) 0.5 %      No current facility-administered medications for this visit.   Facility-Administered Medications Ordered in Other Visits  Medication Dose Route Frequency Provider Last Rate Last Admin   heparin lock flush 100 UNIT/ML injection               .  PHYSICAL EXAMINATION: ECOG PERFORMANCE STATUS: 0 - Asymptomatic  Vitals:   07/04/21 0902  BP: 126/71  Pulse: 74  Resp: 18  Temp: 98.1 F (36.7 C)     Filed Weights   07/04/21 0902  Weight: 211 lb (95.7 kg)       Physical Exam HENT:     Head: Normocephalic and atraumatic.     Mouth/Throat:     Pharynx: No oropharyngeal exudate.  Eyes:     Pupils: Pupils are equal, round, and reactive to light.  Cardiovascular:     Rate and Rhythm: Normal  rate and regular rhythm.  Pulmonary:     Effort: No respiratory distress.     Breath sounds: No wheezing.     Comments: Decreased air entry bilaterally. Abdominal:     General: Bowel sounds are normal. There is no distension.     Palpations: Abdomen is soft. There is no mass.     Tenderness: There is no abdominal tenderness. There is no guarding or rebound.  Musculoskeletal:        General: No tenderness. Normal range of motion.      Cervical back: Normal range of motion and neck supple.  Skin:    General: Skin is warm.  Neurological:     Mental Status: He is alert and oriented to person, place, and time.  Psychiatric:        Mood and Affect: Affect normal.     LABORATORY DATA:  I have reviewed the data as listed Lab Results  Component Value Date   WBC 8.7 07/25/2021   HGB 14.6 07/25/2021   HCT 43.3 07/25/2021   MCV 95.0 07/25/2021   PLT 164 07/25/2021   Recent Labs    06/21/21 0918 07/04/21 0843 07/25/21 0925  NA 134* 139 135  K 3.9 3.7 3.5  CL 103 104 101  CO2 24 24 23   GLUCOSE 103* 110* 135*  BUN 25* 19 21  CREATININE 0.88 0.94 1.05  CALCIUM 8.6* 9.6 9.0  GFRNONAA >60 >60 >60  PROT 6.3* 7.0 7.0  ALBUMIN 4.0 4.0 3.8  AST 16 16 16   ALT 15 14 15   ALKPHOS 44 44 44  BILITOT 0.7 0.8 0.1*    RADIOGRAPHIC STUDIES: I have personally reviewed the radiological images as listed and agreed with the findings in the report. DG Chest 2 View  Result Date: 07/04/2021 CLINICAL DATA:  Cough and shortness of breath times 2-3 months. History of lung cancer. EXAM: CHEST - 2 VIEW COMPARISON:  Oct 18, 2020 radiograph and CT April 25, 2021 FINDINGS: Right chest Port-A-Cath with tip at the superior cavoatrial junction. Prior median sternotomy and CABG. The heart size and mediastinal contours are within normal limits. Known left lower lobe pulmonary lesion is not well seen on today's examination. Chronic bronchitic lung changes. No visible pleural effusion or pneumothorax. The visualized skeletal structures are unremarkable. IMPRESSION: 1. No acute radiographic finding. 2. Known left lower lobe pulmonary lesion is not well seen on today's examination. Electronically Signed   By: Dahlia Bailiff M.D.   On: 07/04/2021 12:55   CT CHEST ABDOMEN PELVIS W CONTRAST  Result Date: 07/22/2021 CLINICAL DATA:  Non-small cell lung cancer. EXAM: CT CHEST, ABDOMEN, AND PELVIS WITH CONTRAST TECHNIQUE: Multidetector CT imaging of the  chest, abdomen and pelvis was performed following the standard protocol during bolus administration of intravenous contrast. RADIATION DOSE REDUCTION: This exam was performed according to the departmental dose-optimization program which includes automated exposure control, adjustment of the mA and/or kV according to patient size and/or use of iterative reconstruction technique. CONTRAST:  171mL OMNIPAQUE IOHEXOL 300 MG/ML  SOLN COMPARISON:  CT 04/25/2021 FINDINGS: CT CHEST FINDINGS Cardiovascular: Port in the anterior chest wall with tip in distal SVC. Post CABG Mediastinum/Nodes: No axillary or supraclavicular adenopathy. No mediastinal or hilar adenopathy. No pericardial fluid. Esophagus normal. Lungs/Pleura: Rounded nodule along the pleural surface of the LEFT lower lobe measures 19 mm by 15 mm compared with 12 x 19 mm on comparison exam. New focus of consolidation in the LEFT lower lobe posterior to fissure measures 1.8 x  1.5 cm (image 106/3). This focus consolidation has a segmental pattern with linear extension towards the periphery of the lung (image 118/4). Centrally the lesion is more nodular appearance (image 121/4) Musculoskeletal: No aggressive osseous lesion. CT ABDOMEN AND PELVIS FINDINGS Hepatobiliary: Small low-density lesions in liver unchanged from prior most consistent benign cysts. Normal gallbladder Pancreas: Pancreas is normal. No ductal dilatation. No pancreatic inflammation. Spleen: Normal spleen Adrenals/urinary tract: Adrenal glands normal. Nodule adjacent the pole of the RIGHT kidney measuring 12 mm compared to 15 mm. Stomach/Bowel: Stomach, small bowel, appendix, and cecum are normal. A segment of mucosal thickening and mild pericolonic inflammation centered about the hepatic flexure (image 72/2. This is a relatively long segment which extends the ascending colon to in the mid transverse colon. No perforation or abscess. Descending colon normal. Rectum normal. Moderate volume stool  throughout the colon similar prior. Vascular/Lymphatic: Abdominal aorta is normal caliber. There is no retroperitoneal or periportal lymphadenopathy. No pelvic lymphadenopathy. Reproductive: Prostate unremarkable Other: No peritoneal metastasis Musculoskeletal: No aggressive osseous lesion. IMPRESSION: Chest Impression: 1. New focus of nodular consolidation in the LEFT lobe is favored focus of pulmonary infection as lesion is not present on CT exam from 04/25/2021. Recommend short-term follow-up evaluation. 2. Stable to decreased size of large LEFT lower lobe pulmonary nodule. Abdomen / Pelvis Impression: 1. There is inflammation of the ascending colon and proximal transverse colon. Findings suggest segmental colitis with differential including inflammatory bowel disease, infectious colitis, or ischemic colitis. Findings new from comparison exam 2. Again demonstrated moderate volume stool. 3. RIGHT suprarenal nodule slightly decreased in size. 4. No hepatic metastasis. Electronically Signed   By: Suzy Bouchard M.D.   On: 07/22/2021 16:24     ASSESSMENT & PLAN:   Cancer of lower lobe of left lung (HCC) #Lung cancer-non-small cell stage IV [right suprarenal nodule-s/p biopsy "non-small cell ca"].  S/p  CarboTaxol plus Keytruda x4 CT NOV 17th, 2022- Interval decrease in size of the posterior left lower lobe pulmonary lesion with interval near complete resolution of the irregular nodular consolidative opacity in the posterior right upper lobe; Interval decrease in size of the right suprarenal nodule.  STABLE.   #Patient tolerating Keytruda with moderate to severe difficulties-severe fatigue/questionable adrenal insufficiency.  Will reevaluate with a CT scan prior to next visit. # HOLD  keytruda today. Labs today reviewed; see below.  #Nasal congestion/runny nose mild hypoxia- 90 on ambulation; HR 110- get CXR today; plan Dex 4 mg/IVFs; continue prednisone.  Recommend Singulair.  Recommend Zyrtec.   #  Diarrhea- 3-4 weeks [2-3 BM loose]- monitor for now.   # ? Subtle adrenal insufficiency-[ No acute process - -September MRI brain negative for any pituitary hypophysitis/brain metastasis]. Continue with long-term prednisone; but at 10 mg/day.  New prescription sent.  # CAD-s/p CABG-/PVD-s/p stent [Dr.Schneir]-improvement in pain noted.  On Plavix --STABLE;  # Cramping in Bil LE- ? electrolyte abnomalites- improved with pickle juice.  Stable.  # IV access: Mediport functioning.  # DISPOSITION: # HOLD Keytruda today;   IVFs 1lit; Dex 4 mg IVP.   # 1 week- IVfs 1lit/dex # Follow up in 3 weeks- MD; labs- cbc/cmp; Keytruda;CT CAP- Dr.B     All  questions were answered. The patient knows to call the clinic with any problems, questions or concerns.    Cammie Sickle, MD 07/25/2021 6:04 PM

## 2021-07-04 NOTE — Assessment & Plan Note (Addendum)
#  Lung cancer-non-small cell stage IV [right suprarenal nodule-s/p biopsy "non-small cell ca"].  S/p  CarboTaxol plus Keytruda x4 CT NOV 17th, 2022- Interval decrease in size of the posterior left lower lobe pulmonary lesion with interval near complete resolution of the irregular nodular consolidative opacity in the posterior right upper lobe; Interval decrease in size of the right suprarenal nodule. STABLE.   #Patient tolerating Keytruda with moderate to severe difficulties-severe fatigue/questionable adrenal insufficiency.  Will reevaluate with a CT scan prior to next visit. # HOLD  keytruda today. Labs today reviewed; see below.  #Nasal congestion/runny nose mild hypoxia- 90 on ambulation; HR 110- get CXR today; plan Dex 4 mg/IVFs; continue prednisone.  Recommend Singulair.  Recommend Zyrtec.   # Diarrhea- 3-4 weeks [2-3 BM loose]- monitor for now.   # ? Subtle adrenal insufficiency-[ No acute process - -September MRI brain negative for any pituitary hypophysitis/brain metastasis]. Continue with long-term prednisone; but at 10 mg/day.  New prescription sent.  # CAD-s/p CABG-/PVD-s/p stent [Dr.Schneir]-improvement in pain noted.  On Plavix --STABLE;  # Cramping in Bil LE- ? electrolyte abnomalites- improved with pickle juice.  Stable.  # IV access: Mediport functioning.  # DISPOSITION: # HOLD Keytruda today;   IVFs 1lit; Dex 4 mg IVP.   # 1 week- IVfs 1lit/dex # Follow up in 3 weeks- MD; labs- cbc/cmp; Keytruda;CT CAP- Dr.B

## 2021-07-04 NOTE — Progress Notes (Signed)
Heart rate sitting 72, O2-93 Lap 1. Heart rate-87, O2-91 Lap 2. Heart rate-88, O2-90 Lap 3. Heart rate-90, O2-91 Lap 4. Heart [id number]

## 2021-07-04 NOTE — Progress Notes (Signed)
FYI ------------------------ Hi- I do not see any significant concerns on the CXR. Continue the allergy medications a we discussed. However, in the next one week-if your breathing does not get better or get worse please let us know we could get the CT scans sooner. Thanks, GB

## 2021-07-05 ENCOUNTER — Telehealth: Payer: Self-pay | Admitting: *Deleted

## 2021-07-05 NOTE — Telephone Encounter (Signed)
Randall Ali called to report that he had a horrible night unable to sleep due to chills. He does not know if it is the new medicine he started or not. He denies fever, temp was 98. He states he is coughing and that he now has headaches "right between my eyes" for past 2 days. The chills stopped at 7 AM

## 2021-07-05 NOTE — Telephone Encounter (Signed)
Hassan Rowan, did he specify what new medication he is referring to?

## 2021-07-05 NOTE — Telephone Encounter (Signed)
Spoke to patient and he went for PCR on 1/24 with negative results.  Not interested in a virtual [facility] visit today but would rather wait to see how he feels on Monday and will call Dr. Tami Ribas if no better.    Advised pt to contact us if needed next week.

## 2021-07-05 NOTE — Telephone Encounter (Addendum)
Returned call to patient.  He says he was having chills all night but did not take his temperature.  Does not have chills or fever today.  Does have headache between eyes, nasal congestion described as in his throat, and chest congestion with cough.  The chest congestion and cough are not new and states he had a negative home COVID test a few days ago.  Does have history of sinus issues which exhibits with a headach between eyes.  Asked if he could call his ENT, Dr. Erin Hearing discussed with Randall Glatter, NP and he advises pt to have COVID pcr testing.  Patient can come by the office for an order if interested.  Communicated to patient chest x ray results from Dr. B:  I do not see any significant concerns on the CXR. Continue the allergy medications a we discussed. However, in the next one week-if your breathing does not get better or get worse please let us know we could get the CT scans sooner.

## 2021-07-05 NOTE — Telephone Encounter (Signed)
He was started on Singulair yesterday

## 2021-07-08 ENCOUNTER — Telehealth: Payer: Self-pay | Admitting: *Deleted

## 2021-07-08 NOTE — Telephone Encounter (Signed)
Patient called reporting that he saw a regular doctor Saturday and was started on an antibiotics and 2 cough medications and said he was told to inform us if this occurred.

## 2021-07-09 ENCOUNTER — Encounter: Payer: Self-pay | Admitting: Internal Medicine

## 2021-07-09 ENCOUNTER — Telehealth: Payer: Self-pay | Admitting: Family Medicine

## 2021-07-10 NOTE — Telephone Encounter (Signed)
Pt called an has been scheduled for 2/6

## 2021-07-11 ENCOUNTER — Inpatient Hospital Stay: Payer: PPO | Attending: Internal Medicine

## 2021-07-11 ENCOUNTER — Other Ambulatory Visit: Payer: Self-pay

## 2021-07-11 VITALS — BP 144/69 | HR 68 | Temp 97.8°F

## 2021-07-11 DIAGNOSIS — Z7951 Long term (current) use of inhaled steroids: Secondary | ICD-10-CM | POA: Diagnosis not present

## 2021-07-11 DIAGNOSIS — Z7902 Long term (current) use of antithrombotics/antiplatelets: Secondary | ICD-10-CM | POA: Insufficient documentation

## 2021-07-11 DIAGNOSIS — Z955 Presence of coronary angioplasty implant and graft: Secondary | ICD-10-CM | POA: Diagnosis not present

## 2021-07-11 DIAGNOSIS — I2510 Atherosclerotic heart disease of native coronary artery without angina pectoris: Secondary | ICD-10-CM | POA: Diagnosis not present

## 2021-07-11 DIAGNOSIS — Z79899 Other long term (current) drug therapy: Secondary | ICD-10-CM | POA: Diagnosis not present

## 2021-07-11 DIAGNOSIS — I10 Essential (primary) hypertension: Secondary | ICD-10-CM | POA: Insufficient documentation

## 2021-07-11 DIAGNOSIS — Z87891 Personal history of nicotine dependence: Secondary | ICD-10-CM | POA: Insufficient documentation

## 2021-07-11 DIAGNOSIS — R197 Diarrhea, unspecified: Secondary | ICD-10-CM | POA: Insufficient documentation

## 2021-07-11 DIAGNOSIS — R058 Other specified cough: Secondary | ICD-10-CM | POA: Insufficient documentation

## 2021-07-11 DIAGNOSIS — Z7982 Long term (current) use of aspirin: Secondary | ICD-10-CM | POA: Diagnosis not present

## 2021-07-11 DIAGNOSIS — Z7952 Long term (current) use of systemic steroids: Secondary | ICD-10-CM | POA: Diagnosis not present

## 2021-07-11 DIAGNOSIS — C3432 Malignant neoplasm of lower lobe, left bronchus or lung: Secondary | ICD-10-CM | POA: Diagnosis not present

## 2021-07-11 DIAGNOSIS — R531 Weakness: Secondary | ICD-10-CM | POA: Insufficient documentation

## 2021-07-11 MED ORDER — SODIUM CHLORIDE 0.9 % IV SOLN
Freq: Once | INTRAVENOUS | Status: AC
  Filled 2021-07-11: qty 250

## 2021-07-11 MED ORDER — HEPARIN SOD (PORK) LOCK FLUSH 100 UNIT/ML IV SOLN
500.0000 [IU] | Freq: Once | INTRAVENOUS | Status: AC | PRN
  Administered 2021-07-11: 500 [IU]
  Filled 2021-07-11: qty 5

## 2021-07-11 MED ORDER — DEXAMETHASONE SODIUM PHOSPHATE 10 MG/ML IJ SOLN
4.0000 mg | Freq: Once | INTRAMUSCULAR | Status: AC
  Administered 2021-07-11: 4 mg via INTRAVENOUS
  Filled 2021-07-11: qty 1

## 2021-07-11 MED ORDER — SODIUM CHLORIDE 0.9% FLUSH
10.0000 mL | Freq: Once | INTRAVENOUS | Status: AC | PRN
  Administered 2021-07-11: 10 mL
  Filled 2021-07-11: qty 10

## 2021-07-11 NOTE — Progress Notes (Signed)
Received IV hydration and dexamethasone. Discharged to home.

## 2021-07-15 ENCOUNTER — Other Ambulatory Visit: Payer: Self-pay

## 2021-07-15 ENCOUNTER — Encounter: Payer: Self-pay | Admitting: Family Medicine

## 2021-07-15 ENCOUNTER — Ambulatory Visit (INDEPENDENT_AMBULATORY_CARE_PROVIDER_SITE_OTHER): Payer: PPO | Admitting: Family Medicine

## 2021-07-15 VITALS — BP 130/66 | HR 70 | Temp 97.5°F | Ht 68.0 in | Wt 214.1 lb

## 2021-07-15 DIAGNOSIS — E782 Mixed hyperlipidemia: Secondary | ICD-10-CM

## 2021-07-15 DIAGNOSIS — M5442 Lumbago with sciatica, left side: Secondary | ICD-10-CM | POA: Diagnosis not present

## 2021-07-15 DIAGNOSIS — R5382 Chronic fatigue, unspecified: Secondary | ICD-10-CM | POA: Diagnosis not present

## 2021-07-15 DIAGNOSIS — G8929 Other chronic pain: Secondary | ICD-10-CM | POA: Diagnosis not present

## 2021-07-15 DIAGNOSIS — C3432 Malignant neoplasm of lower lobe, left bronchus or lung: Secondary | ICD-10-CM | POA: Diagnosis not present

## 2021-07-15 DIAGNOSIS — I10 Essential (primary) hypertension: Secondary | ICD-10-CM | POA: Diagnosis not present

## 2021-07-15 DIAGNOSIS — R5381 Other malaise: Secondary | ICD-10-CM | POA: Diagnosis not present

## 2021-07-15 DIAGNOSIS — G4733 Obstructive sleep apnea (adult) (pediatric): Secondary | ICD-10-CM | POA: Diagnosis not present

## 2021-07-15 MED ORDER — TRAMADOL HCL 50 MG PO TABS: ORAL_TABLET | ORAL | 0 refills | Status: DC

## 2021-07-15 MED ORDER — LISINOPRIL 5 MG PO TABS: 5.0000 mg | ORAL_TABLET | Freq: Every day | ORAL | 3 refills | Status: DC

## 2021-07-15 NOTE — Assessment & Plan Note (Signed)
Continues simvastatin through cardiology - he will ask pharmacy to request refill to heart care office.

## 2021-07-15 NOTE — Addendum Note (Signed)
Addended by: Ria Bush on: 07/15/2021 12:31 PM   Modules accepted: Level of Service

## 2021-07-15 NOTE — Assessment & Plan Note (Signed)
[place] CSRS reviewed.  Known severe lumbar DDD.  Manages with 2-3 tramadol/day.  Tramadol refilled.

## 2021-07-15 NOTE — Assessment & Plan Note (Signed)
Appreciate oncology care along with symptom management clinic care. Continues maintenance Keytruda.

## 2021-07-15 NOTE — Patient Instructions (Addendum)
Touch base with lung doctor about trouble with BiPAP sleep machine.  Call pharmacy or heart doctor to request refill of simvastatin.  Return as needed or in 6 months for physical/wellness visit.

## 2021-07-15 NOTE — Assessment & Plan Note (Signed)
Chronic, great control on low dose lisinopril - continue.

## 2021-07-15 NOTE — Assessment & Plan Note (Signed)
?  Keytruda related. Continue to monitor.

## 2021-07-15 NOTE — Assessment & Plan Note (Signed)
Longstanding, on BiPAP. Notes more difficulty with machine and morning headaches when used.  I asked him to reach out to pulmonology about this.

## 2021-07-15 NOTE — Progress Notes (Signed)
Patient ID: Randall Ali., male    DOB: 07/04/1940, 81 y.o.   MRN: [id number]  This visit was conducted in person.  BP 130/66    Pulse 70    Temp (!) 97.5 F (36.4 C) (Temporal)    Ht 5\' 8"  (1.727 m)    Wt 214 lb 2 oz (97.1 kg)    SpO2 92%    BMI 32.56 kg/m    CC: med refill visit  Subjective:   HPI: Randall Ali. is a 81 y.o. male presenting on 07/15/2021 for Medication Refill (Needs refills for lisinopril, simvastatin and tramadol. 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   Here for med refill visit - requests simvastatin, lisinopril and tramadol refilled. He is on tramadol 50mg  TID PRN #90/month - usually takes 100mg  in am, occasionally takes BID. Simvastatin is refilled through cardiology.   Last seen in clinic 09/2020, in interim diagnosed with lung cancer and has been followed by oncology.   H/o stage IV lung cancer diagnosed 10/2020 s/p Carbotaxol + Keytruda, continues maintenance Keytruda. Ongoing fatigue attributable to [facility]. ?mild adrenal insufficiency on prednisone 10mg  daily.    OSA on BiPAP at night followed by Dr Mortimer Fries. Feels CPAP may be causing morning headache. Sees pulmonologist yearly.   Recently seen at walk in clinic treated with abx and cough medicine with benefit.      Relevant past medical, surgical, family and social history reviewed and updated as indicated. Interim medical history since our last visit reviewed. Allergies and medications reviewed and updated. Outpatient Medications Prior to Visit  Medication Sig  Dispense Refill   acetaminophen (TYLENOL) 325 MG tablet Take 650 mg by mouth as needed for moderate pain or mild pain.     Ascorbic Acid (VITAMIN C) 1000 MG tablet Take 1,000 mg by mouth daily.     aspirin 81 MG EC tablet Take 81 mg by mouth daily.       Calcium-Magnesium-Vitamin D (CALCIUM 1200+D3 PO) Take 1 tablet by mouth daily.     Carboxymethylcellul-Glycerin (LUBRICATING EYE DROPS OP) Place 1 drop into both eyes daily as needed (irritation).     Cholecalciferol (VITAMIN D3) 50 MCG (2000 UT) TABS Take 2,000 Units by mouth daily.     Cyanocobalamin (B-12) 5000 MCG CAPS Take 5,000 mcg by mouth daily.     docusate sodium (COLACE) 100 MG capsule Take 200 mg by mouth at bedtime.     doxycycline (VIBRA-TABS) 100 MG tablet Take 100 mg by mouth 2 (two) times daily.     lidocaine-prilocaine (EMLA) cream Apply 30 -45 mins prior to port access. 30 g 3   Melatonin 10 MG CAPS Take 10 mg by mouth at bedtime as needed (sleep).     montelukast (SINGULAIR) 10 MG tablet Take 1 tablet (10 mg total) by mouth at bedtime. 30 tablet 1   nitroGLYCERIN (NITROSTAT) 0.4 MG SL tablet Place 1 tablet (0.4 mg total) under the tongue every 5 (five) minutes as needed for up to 25 doses for chest pain. 25 tablet 1   pantoprazole (PROTONIX) 40 MG tablet TAKE 1 TABLET BY MOUTH DAILY 90 tablet 3   predniSONE (DELTASONE) 20  MG tablet Take 1/2 a pill. Once a day with food. 40 tablet 0   prochlorperazine (COMPAZINE) 10 MG tablet Take 1 tablet (10 mg total) by mouth every 6 (six) hours as needed for nausea or vomiting. 40 tablet 1   simvastatin (ZOCOR) 40 MG tablet TAKE 1 TABLET BY MOUTH AT BEDTIME 90 tablet 0   triamcinolone ointment (KENALOG) 0.5 %      Vitamin E 450 MG (1000 UT) CAPS Take 450 Units by mouth daily.     Zinc 50 MG TABS Take 50 mg by mouth daily.     lisinopril (ZESTRIL) 5 MG tablet TAKE 1 TABLET BY MOUTH DAILY 90 tablet 0   traMADol (ULTRAM) 50 MG tablet TAKE 1 TABLET BY MOUTH 3 TIMES DAILY AS NEEDED FOR  MODERATE PAIN 90 tablet 0   clopidogrel (PLAVIX) 75 MG tablet Take 1 tablet (75 mg total) by mouth daily. (Patient not taking: Reported on 06/13/2021) 30 tablet 11   erythromycin ophthalmic ointment SMARTSIG:In Eye(s) (Patient not taking: Reported on 06/13/2021)     mupirocin cream (BACTROBAN) 2 % Apply 1 application topically 2 (two) times daily. (Patient not taking: Reported on 06/13/2021) 15 g 0   Niacinamide-Zn-Cu-Methfo-Se-Cr (NICOTINAMIDE PO) Take 500 mg by mouth daily. (Patient not taking: Reported on 06/13/2021)     ondansetron (ZOFRAN) 8 MG tablet One pill every 8 hours as needed for nausea/vomitting. (Patient not taking: Reported on 05/09/2021) 40 tablet 1   Polyethyl Glycol-Propyl Glycol (LUBRICANT EYE DROPS) 0.4-0.3 % SOLN Place 1-2 drops into both eyes 3 (three) times daily as needed (burning eyes.).     Facility-Administered Medications Prior to Visit  Medication Dose Route Frequency Provider Last Rate Last Admin   heparin lock flush 100 UNIT/ML injection              Per HPI unless specifically indicated in ROS section below Review of Systems  Objective:  BP 130/66    Pulse 70    Temp (!) 97.5 F (36.4 C) (Temporal)    Ht 5\' 8"  (1.727 m)    Wt 214 lb 2 oz (97.1 kg)    SpO2 92%    BMI 32.56 kg/m   Wt Readings from Last 3 Encounters:  07/15/21 214 lb 2 oz (97.1 kg)  07/04/21 211 lb (95.7 kg)  06/13/21 212 lb 12.8 oz (96.5 kg)      Physical Exam Vitals and nursing note reviewed.  Constitutional:      Appearance: Normal appearance. He is not ill-appearing.  Cardiovascular:     Rate and Rhythm: Normal rate and regular rhythm.     Pulses: Normal pulses.     Heart sounds: Normal heart sounds. No murmur heard. Pulmonary:     Effort: Pulmonary effort is normal. No respiratory distress.     Breath sounds: No wheezing, rhonchi or rales.     Comments: Decreased air movement bilaterally with few coarse L sided crackles  Musculoskeletal:        General: Normal range of motion.      Right lower leg: No edema.     Left lower leg: No edema.  Skin:    General: Skin is warm and dry.     Findings: No rash.  Neurological:     Mental Status: He is alert.  Psychiatric:        Mood and Affect: Mood normal.        Behavior: Behavior normal.      Results for orders placed or performed in  visit on 07/04/21  Comprehensive metabolic panel  Result Value Ref Range   Sodium 139 135 - 145 mmol/L   Potassium 3.7 3.5 - 5.1 mmol/L   Chloride 104 98 - 111 mmol/L   CO2 24 22 - 32 mmol/L   Glucose, Bld 110 (H) 70 - 99 mg/dL   BUN 19 8 - 23 mg/dL   Creatinine, Ser 0.94 0.61 - 1.24 mg/dL   Calcium 9.6 8.9 - 10.3 mg/dL   Total Protein 7.0 6.5 - 8.1 g/dL   Albumin 4.0 3.5 - 5.0 g/dL   AST 16 15 - 41 U/L   ALT 14 0 - 44 U/L   Alkaline Phosphatase 44 38 - 126 U/L   Total Bilirubin 0.8 0.3 - 1.2 mg/dL   GFR, Estimated >60 >60 mL/min   Anion gap 11 5 - 15  CBC with Differential  Result Value Ref Range   WBC 12.9 (H) 4.0 - 10.5 K/uL   RBC 4.31 4.22 - 5.81 MIL/uL   Hemoglobin 13.8 13.0 - 17.0 g/dL   HCT 40.3 39.0 - 52.0 %   MCV 93.5 80.0 - 100.0 fL   MCH 32.0 26.0 - 34.0 pg   MCHC 34.2 30.0 - 36.0 g/dL   RDW 12.5 11.5 - 15.5 %   Platelets 189 150 - 400 K/uL   nRBC 0.0 0.0 - 0.2 %   Neutrophils Relative % 78 %   Neutro Abs 10.1 (H) 1.7 - 7.7 K/uL   Lymphocytes Relative 10 %   Lymphs Abs 1.3 0.7 - 4.0 K/uL   Monocytes Relative 9 %   Monocytes Absolute 1.1 (H) 0.1 - 1.0 K/uL   Eosinophils Relative 1 %   Eosinophils Absolute 0.2 0.0 - 0.5 K/uL   Basophils Relative 1 %   Basophils Absolute 0.1 0.0 - 0.1 K/uL   Immature Granulocytes 1 %   Abs Immature Granulocytes 0.12 (H) 0.00 - 0.07 K/uL    Assessment & Plan:  This visit occurred during the SARS-CoV-2 public health emergency.  Safety protocols were in place, including screening questions prior to the visit, additional usage of staff PPE, and extensive cleaning of exam room while observing appropriate contact time as  indicated for disinfecting solutions.   Problem List Items Addressed This Visit     Chronic fatigue and malaise    ?Keytruda related. Continue to monitor.       Hyperlipidemia    Continues simvastatin through cardiology - he will ask pharmacy to request refill to heart care office.      Relevant Medications   lisinopril (ZESTRIL) 5 MG tablet   Hypertension    Chronic, great control on low dose lisinopril - continue.       Relevant Medications   lisinopril (ZESTRIL) 5 MG tablet   OSA (obstructive sleep apnea)    Longstanding, on BiPAP. Notes more difficulty with machine and morning headaches when used.  I asked him to reach out to pulmonology about this.       Chronic back pain - Primary    [place] CSRS reviewed.  Known severe lumbar DDD.  Manages with 2-3 tramadol/day.  Tramadol refilled.      Relevant Medications   traMADol (ULTRAM) 50 MG tablet   Cancer of lower lobe of left lung [facility])    Appreciate oncology care along with symptom management clinic care. Continues maintenance Keytruda.       Relevant Medications   doxycycline (VIBRA-TABS) 100 MG tablet     Meds ordered this encounter  Medications   traMADol (ULTRAM) 50 MG tablet    Sig: TAKE 1 TABLET BY MOUTH 3 TIMES DAILY AS NEEDED FOR MODERATE PAIN    Dispense:  90 tablet    Refill:  0   lisinopril (ZESTRIL) 5 MG tablet    Sig: Take 1 tablet (5 mg total) by mouth daily.    Dispense:  90 tablet    Refill:  3   No orders of the defined types were placed in this encounter.    Patient Instructions  Touch base with lung doctor about trouble with BiPAP sleep machine.  Call pharmacy or heart doctor to request refill of simvastatin.  Return as needed or in 6 months for physical/wellness visit.   Follow up plan: Return in about 6 months (around 01/12/2022), or if symptoms worsen or fail to improve, for annual exam, prior fasting for blood work, medicare wellness visit.  Ria Bush, MD

## 2021-07-18 DIAGNOSIS — M62830 Muscle spasm of back: Secondary | ICD-10-CM | POA: Diagnosis not present

## 2021-07-18 DIAGNOSIS — M9905 Segmental and somatic dysfunction of pelvic region: Secondary | ICD-10-CM | POA: Diagnosis not present

## 2021-07-18 DIAGNOSIS — M9903 Segmental and somatic dysfunction of lumbar region: Secondary | ICD-10-CM | POA: Diagnosis not present

## 2021-07-18 DIAGNOSIS — M955 Acquired deformity of pelvis: Secondary | ICD-10-CM | POA: Diagnosis not present

## 2021-07-22 ENCOUNTER — Other Ambulatory Visit: Payer: Self-pay

## 2021-07-22 ENCOUNTER — Inpatient Hospital Stay: Payer: PPO

## 2021-07-22 ENCOUNTER — Telehealth: Payer: Self-pay | Admitting: *Deleted

## 2021-07-22 ENCOUNTER — Ambulatory Visit
Admission: RE | Admit: 2021-07-22 | Discharge: 2021-07-22 | Disposition: A | Discharge status: Home / Self Care | Payer: PPO | Source: Ambulatory Visit | Attending: Internal Medicine | Admitting: Internal Medicine

## 2021-07-22 VITALS — BP 151/68 | HR 74 | Temp 97.7°F | Resp 16

## 2021-07-22 DIAGNOSIS — R911 Solitary pulmonary nodule: Secondary | ICD-10-CM | POA: Diagnosis not present

## 2021-07-22 DIAGNOSIS — C3432 Malignant neoplasm of lower lobe, left bronchus or lung: Secondary | ICD-10-CM | POA: Diagnosis not present

## 2021-07-22 DIAGNOSIS — C3490 Malignant neoplasm of unspecified part of unspecified bronchus or lung: Secondary | ICD-10-CM | POA: Diagnosis not present

## 2021-07-22 DIAGNOSIS — Z951 Presence of aortocoronary bypass graft: Secondary | ICD-10-CM | POA: Diagnosis not present

## 2021-07-22 DIAGNOSIS — Z95828 Presence of other vascular implants and grafts: Secondary | ICD-10-CM

## 2021-07-22 DIAGNOSIS — K529 Noninfective gastroenteritis and colitis, unspecified: Secondary | ICD-10-CM | POA: Diagnosis not present

## 2021-07-22 DIAGNOSIS — K6389 Other specified diseases of intestine: Secondary | ICD-10-CM | POA: Diagnosis not present

## 2021-07-22 DIAGNOSIS — K769 Liver disease, unspecified: Secondary | ICD-10-CM | POA: Diagnosis not present

## 2021-07-22 MED ORDER — SODIUM CHLORIDE 0.9 % IV SOLN
INTRAVENOUS | Status: AC
  Filled 2021-07-22 (×2): qty 250

## 2021-07-22 MED ORDER — DEXAMETHASONE SODIUM PHOSPHATE 10 MG/ML IJ SOLN
4.0000 mg | Freq: Once | INTRAMUSCULAR | Status: AC
  Administered 2021-07-22: 4 mg via INTRAVENOUS
  Filled 2021-07-22: qty 1

## 2021-07-22 MED ORDER — SODIUM CHLORIDE 0.9% FLUSH
10.0000 mL | Freq: Once | INTRAVENOUS | Status: AC
  Administered 2021-07-22: 10 mL via INTRAVENOUS
  Filled 2021-07-22: qty 10

## 2021-07-22 MED ORDER — HEPARIN SOD (PORK) LOCK FLUSH 100 UNIT/ML IV SOLN
500.0000 [IU] | Freq: Once | INTRAVENOUS | Status: AC
  Administered 2021-07-22: 500 [IU] via INTRAVENOUS
  Filled 2021-07-22: qty 5

## 2021-07-22 MED ORDER — SODIUM CHLORIDE 0.9 % IV SOLN: 4.0000 mg | Freq: Once | INTRAVENOUS | Status: DC

## 2021-07-22 MED ORDER — IOHEXOL 300 MG/ML  SOLN
100.0000 mL | Freq: Once | INTRAMUSCULAR | Status: AC | PRN
  Administered 2021-07-22: 100 mL via INTRAVENOUS

## 2021-07-22 NOTE — Progress Notes (Signed)
Pt requested to come in to fluid clinic today due to complaints of weakness and fatigue. He also requests injection of dexamethasone. Per Dr Rogue Bussing, verbal order received for 4mg  IV dexamethasone. Pt also received 1L NS.

## 2021-07-22 NOTE — Telephone Encounter (Signed)
I called and spoke with patient and he agrees to come for IV fluids after he completes his CT scan this morning

## 2021-07-22 NOTE — Progress Notes (Signed)
I spoke to patient regarding the results of the CT scan. Cancer study/slightly improved. New inflammatory nodule in the left lower lobe.   Mild- moderate information noted in the right colon however patient not having symptoms of diarrhea, abdominal pain and cramping. Will discuss further at next visit in the next three days.

## 2021-07-22 NOTE — Telephone Encounter (Signed)
Patient called asking to come in for IV fluids and steroids stating he does not feel well. When questioned for more specifics, he states he is weak and can't hardly walk and he has a cough. Denies fever or other symptoms. He states he felt like this several weeks ago and he was given IV fluids and steroids then. Please advise He states he has a scan scheduled for 1030

## 2021-07-25 ENCOUNTER — Encounter: Payer: Self-pay | Admitting: Internal Medicine

## 2021-07-25 ENCOUNTER — Inpatient Hospital Stay (HOSPITAL_BASED_OUTPATIENT_CLINIC_OR_DEPARTMENT_OTHER): Payer: PPO | Admitting: Internal Medicine

## 2021-07-25 ENCOUNTER — Inpatient Hospital Stay: Payer: PPO

## 2021-07-25 ENCOUNTER — Other Ambulatory Visit: Payer: Self-pay

## 2021-07-25 ENCOUNTER — Telehealth: Payer: Self-pay | Admitting: *Deleted

## 2021-07-25 DIAGNOSIS — C3432 Malignant neoplasm of lower lobe, left bronchus or lung: Secondary | ICD-10-CM

## 2021-07-25 DIAGNOSIS — Z95828 Presence of other vascular implants and grafts: Secondary | ICD-10-CM

## 2021-07-25 LAB — CBC WITH DIFFERENTIAL/PLATELET
Abs Immature Granulocytes: 0.05 10*3/uL (ref 0.00–0.07)
Basophils Absolute: 0 10*3/uL (ref 0.0–0.1)
Basophils Relative: 1 %
Eosinophils Absolute: 0.2 10*3/uL (ref 0.0–0.5)
Eosinophils Relative: 2 %
HCT: 43.3 % (ref 39.0–52.0)
Hemoglobin: 14.6 g/dL (ref 13.0–17.0)
Immature Granulocytes: 1 %
Lymphocytes Relative: 15 %
Lymphs Abs: 1.3 10*3/uL (ref 0.7–4.0)
MCH: 32 pg (ref 26.0–34.0)
MCHC: 33.7 g/dL (ref 30.0–36.0)
MCV: 95 fL (ref 80.0–100.0)
Monocytes Absolute: 0.9 10*3/uL (ref 0.1–1.0)
Monocytes Relative: 11 %
Neutro Abs: 6.2 10*3/uL (ref 1.7–7.7)
Neutrophils Relative %: 70 %
Platelets: 164 10*3/uL (ref 150–400)
RBC: 4.56 MIL/uL (ref 4.22–5.81)
RDW: 12.9 % (ref 11.5–15.5)
WBC: 8.7 10*3/uL (ref 4.0–10.5)
nRBC: 0 % (ref 0.0–0.2)

## 2021-07-25 LAB — COMPREHENSIVE METABOLIC PANEL
ALT: 15 U/L (ref 0–44)
AST: 16 U/L (ref 15–41)
Albumin: 3.8 g/dL (ref 3.5–5.0)
Alkaline Phosphatase: 44 U/L (ref 38–126)
Anion gap: 11 (ref 5–15)
BUN: 21 mg/dL (ref 8–23)
CO2: 23 mmol/L (ref 22–32)
Calcium: 9 mg/dL (ref 8.9–10.3)
Chloride: 101 mmol/L (ref 98–111)
Creatinine, Ser: 1.05 mg/dL (ref 0.61–1.24)
GFR, Estimated: >60 mL/min (ref >60)
Glucose, Bld: 135 mg/dL — ABNORMAL HIGH (ref 70–99)
Potassium: 3.5 mmol/L (ref 3.5–5.1)
Sodium: 135 mmol/L (ref 135–145)
Total Bilirubin: 0.1 mg/dL — ABNORMAL LOW (ref 0.3–1.2)
Total Protein: 7 g/dL (ref 6.5–8.1)

## 2021-07-25 MED ORDER — LEVOFLOXACIN 500 MG PO TABS: 500.0000 mg | ORAL_TABLET | Freq: Every day | ORAL | 0 refills | Status: DC

## 2021-07-25 MED ORDER — HYDROCOD POLI-CHLORPHE POLI ER 10-8 MG/5ML PO SUER: 5.0000 mL | Freq: Two times a day (BID) | ORAL | 0 refills | Status: DC | PRN

## 2021-07-25 MED ORDER — PREDNISONE 20 MG PO TABS: 60.0000 mg | ORAL_TABLET | Freq: Every day | ORAL | 0 refills | Status: DC

## 2021-07-25 MED ORDER — DEXAMETHASONE SODIUM PHOSPHATE 10 MG/ML IJ SOLN
4.0000 mg | Freq: Once | INTRAMUSCULAR | Status: AC
  Administered 2021-07-25: 4 mg via INTRAVENOUS
  Filled 2021-07-25: qty 1

## 2021-07-25 MED ORDER — ADVAIR HFA 115-21 MCG/ACT IN AERO: 2.0000 | INHALATION_SPRAY | Freq: Two times a day (BID) | RESPIRATORY_TRACT | 12 refills | Status: DC

## 2021-07-25 MED ORDER — HEPARIN SOD (PORK) LOCK FLUSH 100 UNIT/ML IV SOLN
INTRAVENOUS | Status: AC
  Filled 2021-07-25: qty 5

## 2021-07-25 MED ORDER — SODIUM CHLORIDE 0.9% FLUSH
10.0000 mL | Freq: Once | INTRAVENOUS | Status: DC
  Administered 2021-07-25: 10 mL via INTRAVENOUS
  Filled 2021-07-25: qty 10

## 2021-07-25 MED ORDER — ALBUTEROL SULFATE HFA 108 (90 BASE) MCG/ACT IN AERS: 2.0000 | INHALATION_SPRAY | Freq: Four times a day (QID) | RESPIRATORY_TRACT | 2 refills | Status: DC | PRN

## 2021-07-25 MED ORDER — HEPARIN SOD (PORK) LOCK FLUSH 100 UNIT/ML IV SOLN
500.0000 [IU] | Freq: Once | INTRAVENOUS | Status: AC
  Administered 2021-07-25: 500 [IU] via INTRAVENOUS
  Filled 2021-07-25: qty 5

## 2021-07-25 NOTE — Progress Notes (Signed)
Patient informed per DR. B no treatment today or "NO IVFs; ONLY Dex 4 mg IVP". Given. Patient agrees and verbalizes understanding. Discharged.

## 2021-07-25 NOTE — Telephone Encounter (Signed)
Patient was already at clinic when this message was received.

## 2021-07-25 NOTE — Progress Notes (Signed)
Patient reports having increasing SOBr, congestion, and productive cough for the past 5 days.  Has not check his temperature at home but did have chills this morning.  Was seen at a walk in clinic a couple weeks ago for similar symptoms and was treated with antibiotic and cough syrup that did help him feel better but symptoms returned 5 days ago.  Temp in office today is 99.8 and O2 sat is 93%.

## 2021-07-25 NOTE — Telephone Encounter (Signed)
Pt being seen by Dr. B today.

## 2021-07-25 NOTE — Patient Instructions (Signed)
Dexamethasone injection What is this medication? DEXAMETHASONE (dex a METH a sone) is a corticosteroid. It is used to treat inflammation of the skin, joints, lungs, and other organs. Common conditions treated include asthma, allergies, and arthritis. It is also used for other conditions, like blood disorders and diseases of the adrenal glands. This medicine may be used for other purposes; ask your health care provider or pharmacist if you have questions. COMMON BRAND NAME(S): Decadron, DoubleDex, ReadySharp Dexamethasone, Simplist Dexamethasone, Solurex What should I tell my care team before I take this medication? They need to know if you have any of these conditions: Cushing's syndrome diabetes glaucoma heart disease high blood pressure infection like herpes, measles, tuberculosis, or chickenpox kidney disease liver disease mental illness myasthenia gravis osteoporosis previous heart attack seizures stomach or intestine problems thyroid disease an unusual or allergic reaction to dexamethasone, corticosteroids, other medicines, lactose, foods, dyes, or preservatives pregnant or trying to get pregnant breast-feeding How should I use this medication? This medicine is for injection into a muscle, joint, lesion, soft tissue, or vein. It is given by a health care professional in a hospital or clinic setting. Talk to your pediatrician regarding the use of this medicine in children. Special care may be needed. Overdosage: If you think you have taken too much of this medicine contact a poison control center or emergency room at once. NOTE: This medicine is only for you. Do not share this medicine with others. What if I miss a dose? This may not apply. If you are having a series of injections over a prolonged period, try not to miss an appointment. Call your doctor or health care professional to reschedule if you are unable to keep an appointment. What may interact with this medication? Do  not take this medicine with any of the following medications: live virus vaccines This medicine may also interact with the following medications: aminoglutethimide amphotericin B aspirin and aspirin-like medicines certain antibiotics like erythromycin, clarithromycin, and troleandomycin certain antivirals for HIV or hepatitis certain medicines for seizures like carbamazepine, phenobarbital, phenytoin certain medicines to treat myasthenia gravis cholestyramine cyclosporine digoxin diuretics ephedrine male hormones, like estrogen or progestins and birth control pills insulin or other medicines for diabetes isoniazid ketoconazole medicines that relax muscles for surgery mifepristone NSAIDs, medicines for pain and inflammation, like ibuprofen or naproxen rifampin skin tests for allergies thalidomide vaccines warfarin This list may not describe all possible interactions. Give your health care provider a list of all the medicines, herbs, non-prescription drugs, or dietary supplements you use. Also tell them if you smoke, drink alcohol, or use illegal drugs. Some items may interact with your medicine. What should I watch for while using this medication? Visit your health care professional for regular checks on your progress. Tell your health care professional if your symptoms do not start to get better or if they get worse. Your condition will be monitored carefully while you are receiving this medicine. Wear a medical ID bracelet or chain. Carry a card that describes your disease and details of your medicine and dosage times. This medicine may increase your risk of getting an infection. Call your health care professional for advice if you get a fever, chills, or sore throat, or other symptoms of a cold or flu. Do not treat yourself. Try to avoid being around people who are sick. Call your health care professional if you are around anyone with measles, chickenpox, or if you develop sores or  blisters that do not heal properly. If you  are going to need surgery or other procedures, tell your doctor or health care professional that you have taken this medicine within the last 12 months. Ask your doctor or health care professional about your diet. You may need to lower the amount of salt you eat. This medicine may increase blood sugar. Ask your healthcare provider if changes in diet or medicines are needed if you have diabetes. What side effects may I notice from receiving this medication? Side effects that you should report to your doctor or health care professional as soon as possible: allergic reactions like skin rash, itching or hives, swelling of the face, lips, or tongue bloody or black, tarry stools changes in emotions or moods changes in vision confusion, excitement, restlessness depressed mood eye pain hallucinations muscle weakness severe or sudden stomach or belly pain signs and symptoms of high blood sugar such as being more thirsty or hungry or having to urinate more than normal. You may also feel very tired or have blurry vision. signs and symptoms of infection like fever; chills; cough; sore throat; pain or trouble passing urine swelling of ankles, feet unusual bruising or bleeding wounds that do not heal Side effects that usually do not require medical attention (report to your doctor or health care professional if they continue or are bothersome): increased appetite increased growth of face or body hair headache nausea, vomiting pain, redness, or irritation at site where injected skin problems, acne, thin and shiny skin trouble sleeping weight gain This list may not describe all possible side effects. Call your doctor for medical advice about side effects. You may report side effects to FDA at 1-800-FDA-1088. Where should I keep my medication? This medicine is given in a hospital or clinic and will not be stored at home. NOTE: This sheet is a summary. It may  not cover all possible information. If you have questions about this medicine, talk to your doctor, pharmacist, or health care provider.  2022 Elsevier/Gold Standard (2018-12-09 00:00:00)

## 2021-07-25 NOTE — Progress Notes (Signed)
[place] NOTE  Patient Care Team: Ria Bush, MD as PCP - General (Family Medicine) [name] [name], MD as PCP - Cardiology (Cardiology) [name], MD (Internal Medicine) Minna Merritts, MD as Consulting Physician (Cardiology) Pieter Partridge, DO as Consulting Physician (Neurology) Cammie Sickle, MD as Consulting Physician (Hematology and Oncology) Telford Nab, RN as Oncology Nurse Navigator  CHIEF COMPLAINTS/PURPOSE OF CONSULTATION: lung cancer    Oncology History Overview Note  #MAY 2022-Lung cancer-non-small cell [CT guided bx] T3N1 vs stage IV Dr.Hendrickson.  MRI brain negative for malignancy.# 1. April 2022- LLL ~4.0 cm mass in the superior segment left lower lobe abuts the major fissure and the posterior pleural surface without visible chest wall invasion without pleural effusion. There 2-3 other small nodules in the left lower lobe, largest measures 9 mm in diameter. These are suspicious for same lobe satellite lesions and there is left hilar adenopathy. Assuming non-small cell lung cancer the appearance is compatible with T3 N1 M0 disease (stage IIIA).  # right suprarenal nodule-awaiting biopsy on 5/31- non-small cell [revived at tumor conference at [place]  # June 9th 2022- CARBO-TAXOl-KEYTRUDA; Fulphila  # ? Subtle adrenal insufficiency- No acute process - -September MRI brain negative for any pituitary hypophysitis/brain metastasis. Prednisone   MOLECULAR TESTING: NGS TPS-PDL 100%; EXON 12 amplification* mutations.    # CAD [CABG 2016; Dr.Gollan]   Cancer of lower lobe of left lung ([place])  11/02/2020 Initial Diagnosis   Cancer of lower lobe of left lung ([place])   11/02/2020 Cancer Staging   Staging form: Lung, AJCC 8th Edition - Clinical: Stage IVA (cT3, cN1, cM1a) - Signed by Cammie Sickle, MD on 11/02/2020    11/16/2020 -  Chemotherapy   Patient is on Treatment Plan : LUNG NSCLC Carboplatin + Paclitaxel +  Pembrolizumab q21d x 4 cycles / Pembrolizumab Maintenance Q21D        HISTORY OF PRESENTING ILLNESS: Alone.  Walking independently.  Randall Ali. 81 y.o.  male lung cancer-non-small cell stage IV [supra-renal nodule s/p biopsy] currently on Keytruda is for follow-up/review results of the CT scan.  In the interim patient was evaluated in the urgent care clinic-for respiratory symptoms-received antibiotics for 7 days; cough medicine.    Symptoms improved-however got worse in the last few days again.  Continues to complain of increasing shortness of breath nasal congestion productive cough.  No fevers.  Patient continues to have ongoing fatigue. Patient continues to make prednisone 20 mg a day.    No nausea no vomiting.  No headaches.  Review of Systems  Constitutional:  Positive for malaise/fatigue. Negative for chills, diaphoresis, fever and weight loss.  HENT:  Negative for nosebleeds and sore throat.   Eyes:  Negative for double vision.  Respiratory:  Negative for cough, hemoptysis, sputum production, shortness of breath and wheezing.   Cardiovascular:  Negative for chest pain, palpitations, orthopnea and leg swelling.  Gastrointestinal:  Negative for abdominal pain, blood in stool, constipation, diarrhea, heartburn, melena, nausea and vomiting.  Genitourinary:  Negative for dysuria, frequency and urgency.  Musculoskeletal:  Positive for back pain and joint pain.  Skin: Negative.  Negative for itching and rash.  Neurological:  Negative for dizziness, tingling, focal weakness, weakness and headaches.  Endo/Heme/Allergies:  Does not bruise/bleed easily.  Psychiatric/Behavioral:  Negative for depression. The patient is not nervous/anxious and does not have insomnia.     MEDICAL HISTORY:  Past Medical History:  Diagnosis Date   Allergy  seasonal   Arthritis    all over- in general    CAD (coronary artery disease)    a. inferior wall MI 10/01 s/p PCI/DES to RCA; b. Myoview  3/16 neg for ischemia; c. LHC 8/16: ostLAD 80%, OM1 70%, OM2 70% x 2 lesions, mRCA 30%, dRCA 70% s/p 4-V CABG 01/24/15 (LIMA-LAD, VG- OM1, VG-OM2, VG-PDA)    Cancer (HCC)    skin, melanoma   Carotid artery disease ([place])    a. [place] 8/16: 1-39% bilateral ICA stenosis   Cataract    removed   Diastolic dysfunction    a. TTE 8/16: EF 55-60%, no RWMA, Gr1DD, calcified mitral annulus, mild biatrial enlargement   Erectile dysfunction    GERD (gastroesophageal reflux disease)    History of elbow surgery    History of hiatal hernia    HLD (hyperlipidemia)    HTN (hypertension)    Inferior myocardial infarction ([place]) 03/2000   stent RCA   Lung cancer ([place])    Postoperative wound infection 02/02/2015   Reflux esophagitis    Sleep apnea 2017   CPAP at night    SURGICAL HISTORY: Past Surgical History:  Procedure Laterality Date   arm surgery  2010   BROW LIFT Bilateral 11/25/2019   Procedure: BROW PTOSIS REPAIR BILATERAL;  Surgeon: Karle Starch, MD;  Location: [facility];  Service: Ophthalmology;  Laterality: Bilateral;  sleep apnea   CARDIAC CATHETERIZATION  06/24/2011   CARDIAC CATHETERIZATION N/A 01/18/2015   Procedure: Left Heart Cath with coronary angiography;  Surgeon: Minna Merritts, MD;  Location: [facility];  Service: Cardiovascular;  Laterality: N/A;   CARDIAC CATHETERIZATION N/A 01/18/2015   Procedure: Intravascular Pressure Wire/FFR Study;  Surgeon: [name], MD;  Location: [facility];  Service: Cardiovascular;  Laterality: N/A;   CAROTID STENT  03/10/2011   COLONOSCOPY  2010   COLONOSCOPY  06/14/2014   Dr Hilarie Fredrickson   CORONARY ARTERY BYPASS GRAFT N/A 01/24/2015   Procedure: CORONARY ARTERY BYPASS GRAFTING x 4 (LIMA-LAD, SVG-Int 1- Int 2, SVG-PD) ENDOSCOPIC GREATER SAPHENOUS VEIN HARVEST LEFT LEG;  Surgeon: [name], MD;  Location: [facility];  Service: Open Heart Surgery;  Laterality: N/A;   EMBOLECTOMY  06/15/2019   Procedure:  EMBOLECTOMY;  Surgeon: Katha Cabal, MD;  Location: [facility];  Service: Vascular;;  right superficial femoral artery   ENDARTERECTOMY FEMORAL Right 06/15/2019   Procedure: ENDARTERECTOMY FEMORAL;  Surgeon: Katha Cabal, MD;  Location: [facility];  Service: Vascular;  Laterality: Right;  common femoral profunda femoris superficial femoral   ESOPHAGOGASTRODUODENOSCOPY (EGD) WITH PROPOFOL N/A 04/24/2016   Procedure: ESOPHAGOGASTRODUODENOSCOPY (EGD) WITH PROPOFOL;  Surgeon: Jerene Bears, MD;  Location: [facility];  Service: Gastroenterology;  Laterality: N/A;   EYE SURGERY     lasik 15 yrs. ago, cataracts removed - both eyes    HAMMER TOE SURGERY     right toe   INSERTION OF ILIAC STENT Right 06/15/2019   Procedure: INSERTION OF ILIAC STENT ( STENTING OF SFA/POP ARTERY );  Surgeon: Katha Cabal, MD;  Location: [facility];  Service: Vascular;  Laterality: Right;  angioplpasty and stent placement: right superficial femoral right tibiopopliteal trunk bilateral common iliac arteries   IR IMAGING GUIDED PORT INSERTION  11/09/2020   LEFT HEART CATH AND CORONARY ANGIOGRAPHY Left 06/10/2017   Procedure: LEFT HEART CATH AND CORONARY ANGIOGRAPHY;  Surgeon: Minna Merritts, MD;  Location: [facility];  Service: Cardiovascular;  Laterality: Left;  LOWER EXTREMITY ANGIOGRAPHY Left 01/04/2019   Procedure: LOWER EXTREMITY ANGIOGRAPHY;  Surgeon: Katha Cabal, MD;  Location: [facility];  Service: Cardiovascular;  Laterality: Left;   LOWER EXTREMITY ANGIOGRAPHY Right 01/25/2019   Procedure: LOWER EXTREMITY ANGIOGRAPHY;  Surgeon: Katha Cabal, MD;  Location: [facility];  Service: Cardiovascular;  Laterality: Right;   LOWER EXTREMITY ANGIOGRAPHY Right 01/15/2021   LOWER EXTREMITY ANGIOGRAPHY and stent placement to R SFA and popliteal artery Delana Meyer, Dolores Lory, MD)   NASAL SINUS SURGERY  2008   septpolasty, bilateral turbinate reduction   SHOULDER  ARTHROSCOPY  2012   TEE WITHOUT CARDIOVERSION N/A 01/24/2015   Procedure: TRANSESOPHAGEAL ECHOCARDIOGRAM (TEE);  Surgeon: [name], MD;  Location: [facility];  Service: Open Heart Surgery;  Laterality: N/A;   TOE SURGERY  1994   UPPER GI ENDOSCOPY  07/2014, 04-24-16   Dr Raquel James   WRIST SURGERY  2011    SOCIAL HISTORY: Social History   Socioeconomic History   Marital status: Single    Spouse name: Not on file   Number of children: Not on file   Years of education: 12   Highest education level: Not on file  Occupational History   Occupation: retired    Comment: [employer]  Tobacco Use   Smoking status: Former    Packs/day: 2.50    Years: 40.00    Pack years: 100.00    Types: Cigarettes    Quit date: 03/24/2000    Years since quitting: 21.3   Smokeless tobacco: Never  Vaping Use   Vaping Use: Never used  Substance and Sexual Activity   Alcohol use: Yes    Comment: occassionally   Drug use: No   Sexual activity: Yes  Other Topics Concern   Not on file  Social History Narrative   Singled; lives with son and dog    Occ: retired, back part time at [employer];    Activity: gym 4-5d/wk   Diet: good water, fruits/vegetables daily   Caffeine Use-yes      ------------------------------------       Car sales- retd; [employer]- retd; quit smoking 2001. Alcohol couple nights a week. Live in [place]. Daughter lives 10 mins.    Social Determinants of Health   Financial Resource Strain: Not on file  Food Insecurity: Not on file  Transportation Needs: Not on file  Physical Activity: Not on file  Stress: Not on file  Social Connections: Not on file  Intimate Partner Violence: Not on file    FAMILY HISTORY: Family History  Problem Relation Age of Onset   Hypertension Mother    Heart disease Mother    Hypertension Father    Diabetes Father    Lymphoma Sister    Heart disease Brother 48   Cancer Paternal Grandfather    Colon cancer Neg Hx    Prostate cancer Neg Hx     Bladder Cancer Neg Hx    Kidney cancer Neg Hx     ALLERGIES:  is allergic to flomax [tamsulosin].  MEDICATIONS:  Current Outpatient Medications  Medication Sig Dispense Refill   acetaminophen (TYLENOL) 325 MG tablet Take 650 mg by mouth as needed for moderate pain or mild pain.     albuterol (VENTOLIN HFA) 108 (90 Base) MCG/ACT inhaler Inhale 2 puffs into the lungs every 6 (six) hours as needed for wheezing or shortness of breath. 8 g 2   Ascorbic Acid (VITAMIN C) 1000 MG tablet Take 1,000 mg by  mouth daily.     aspirin 81 MG EC tablet Take 81 mg by mouth daily.       Calcium-Magnesium-Vitamin D (CALCIUM 1200+D3 PO) Take 1 tablet by mouth daily.     Carboxymethylcellul-Glycerin (LUBRICATING EYE DROPS OP) Place 1 drop into both eyes daily as needed (irritation).     chlorpheniramine-HYDROcodone 10-8 MG/5ML Take 5 mLs by mouth every 12 (twelve) hours as needed for cough. 115 mL 0   Cholecalciferol (VITAMIN D3) 50 MCG (2000 UT) TABS Take 2,000 Units by mouth daily.     Cyanocobalamin (B-12) 5000 MCG CAPS Take 5,000 mcg by mouth daily.     docusate sodium (COLACE) 100 MG capsule Take 200 mg by mouth at bedtime.     fluticasone-salmeterol (ADVAIR HFA) 115-21 MCG/ACT inhaler Inhale 2 puffs into the lungs 2 (two) times daily. 1 each 12   levofloxacin (LEVAQUIN) 500 MG tablet Take 1 tablet (500 mg total) by mouth daily. 10 tablet 0   lidocaine-prilocaine (EMLA) cream Apply 30 -45 mins prior to port access. 30 g 3   lisinopril (ZESTRIL) 5 MG tablet Take 1 tablet (5 mg total) by mouth daily. 90 tablet 3   Melatonin 10 MG CAPS Take 10 mg by mouth at bedtime as needed (sleep).     nitroGLYCERIN (NITROSTAT) 0.4 MG SL tablet Place 1 tablet (0.4 mg total) under the tongue every 5 (five) minutes as needed for up to 25 doses for chest pain. 25 tablet 1   pantoprazole (PROTONIX) 40 MG tablet TAKE 1 TABLET BY MOUTH DAILY 90 tablet 3   predniSONE (DELTASONE) 20 MG tablet Take 1/2 a pill. Once a day with  food. 40 tablet 0   predniSONE (DELTASONE) 20 MG tablet Take 3 tablets (60 mg total) by mouth daily with breakfast. 3 pills once a day with breakfast-do not stop until next visit/1 week 60 tablet 0   prochlorperazine (COMPAZINE) 10 MG tablet Take 1 tablet (10 mg total) by mouth every 6 (six) hours as needed for nausea or vomiting. 40 tablet 1   simvastatin (ZOCOR) 40 MG tablet TAKE 1 TABLET BY MOUTH AT BEDTIME 90 tablet 0   traMADol (ULTRAM) 50 MG tablet TAKE 1 TABLET BY MOUTH 3 TIMES DAILY AS NEEDED FOR MODERATE PAIN 90 tablet 0   triamcinolone ointment (KENALOG) 0.5 %      Vitamin E 450 MG (1000 UT) CAPS Take 450 Units by mouth daily.     Zinc 50 MG TABS Take 50 mg by mouth daily.     doxycycline (VIBRA-TABS) 100 MG tablet Take 100 mg by mouth 2 (two) times daily. (Patient not taking: Reported on 07/25/2021)     montelukast (SINGULAIR) 10 MG tablet Take 1 tablet (10 mg total) by mouth at bedtime. (Patient not taking: Reported on 07/25/2021) 30 tablet 1   No current facility-administered medications for this visit.   Facility-Administered Medications Ordered in Other Visits  Medication Dose Route Frequency Provider Last Rate Last Admin   heparin lock flush 100 UNIT/ML injection               .  PHYSICAL EXAMINATION: ECOG PERFORMANCE STATUS: 0 - Asymptomatic  Vitals:   07/25/21 0900  BP: 118/72  Pulse: 92  Resp: 20  SpO2: 93%      Filed Weights   07/25/21 0900  Weight: 212 lb (96.2 kg)        Physical Exam HENT:     Head: Normocephalic and atraumatic.     Mouth/Throat:  Pharynx: No oropharyngeal exudate.  Eyes:     Pupils: Pupils are equal, round, and reactive to light.  Cardiovascular:     Rate and Rhythm: Normal rate and regular rhythm.  Pulmonary:     Effort: No respiratory distress.     Breath sounds: No wheezing.     Comments: Decreased air entry bilaterally. Abdominal:     General: Bowel sounds are normal. There is no distension.     Palpations:  Abdomen is soft. There is no mass.     Tenderness: There is no abdominal tenderness. There is no guarding or rebound.  Musculoskeletal:        General: No tenderness. Normal range of motion.     Cervical back: Normal range of motion and neck supple.  Skin:    General: Skin is warm.  Neurological:     Mental Status: He is alert and oriented to person, place, and time.  Psychiatric:        Mood and Affect: Affect normal.     LABORATORY DATA:  I have reviewed the data as listed Lab Results  Component Value Date   WBC 8.7 07/25/2021   HGB 14.6 07/25/2021   HCT 43.3 07/25/2021   MCV 95.0 07/25/2021   PLT 164 07/25/2021   Recent Labs    06/21/21 0918 07/04/21 0843 07/25/21 0925  NA 134* 139 135  K 3.9 3.7 3.5  CL 103 104 101  CO2 24 24 23   GLUCOSE 103* 110* 135*  BUN 25* 19 21  CREATININE 0.88 0.94 1.05  CALCIUM 8.6* 9.6 9.0  GFRNONAA >60 >60 >60  PROT 6.3* 7.0 7.0  ALBUMIN 4.0 4.0 3.8  AST 16 16 16   ALT 15 14 15   ALKPHOS 44 44 44  BILITOT 0.7 0.8 0.1*    RADIOGRAPHIC STUDIES: I have personally reviewed the radiological images as listed and agreed with the findings in the report. DG Chest 2 View  Result Date: 07/04/2021 CLINICAL DATA:  Cough and shortness of breath times 2-3 months. History of lung cancer. EXAM: CHEST - 2 VIEW COMPARISON:  Oct 18, 2020 radiograph and CT April 25, 2021 FINDINGS: Right chest Port-A-Cath with tip at the superior cavoatrial junction. Prior median sternotomy and CABG. The heart size and mediastinal contours are within normal limits. Known left lower lobe pulmonary lesion is not well seen on today's examination. Chronic bronchitic lung changes. No visible pleural effusion or pneumothorax. The visualized skeletal structures are unremarkable. IMPRESSION: 1. No acute radiographic finding. 2. Known left lower lobe pulmonary lesion is not well seen on today's examination. Electronically Signed   By: Dahlia Bailiff M.D.   On: 07/04/2021 12:55    CT CHEST ABDOMEN PELVIS W CONTRAST  Result Date: 07/22/2021 CLINICAL DATA:  Non-small cell lung cancer. EXAM: CT CHEST, ABDOMEN, AND PELVIS WITH CONTRAST TECHNIQUE: Multidetector CT imaging of the chest, abdomen and pelvis was performed following the standard protocol during bolus administration of intravenous contrast. RADIATION DOSE REDUCTION: This exam was performed according to the departmental dose-optimization program which includes automated exposure control, adjustment of the mA and/or kV according to patient size and/or use of iterative reconstruction technique. CONTRAST:  159mL OMNIPAQUE IOHEXOL 300 MG/ML  SOLN COMPARISON:  CT 04/25/2021 FINDINGS: CT CHEST FINDINGS Cardiovascular: Port in the anterior chest wall with tip in distal SVC. Post CABG Mediastinum/Nodes: No axillary or supraclavicular adenopathy. No mediastinal or hilar adenopathy. No pericardial fluid. Esophagus normal. Lungs/Pleura: Rounded nodule along the pleural surface of the LEFT lower lobe measures  19 mm by 15 mm compared with 12 x 19 mm on comparison exam. New focus of consolidation in the LEFT lower lobe posterior to fissure measures 1.8 x 1.5 cm (image 106/3). This focus consolidation has a segmental pattern with linear extension towards the periphery of the lung (image 118/4). Centrally the lesion is more nodular appearance (image 121/4) Musculoskeletal: No aggressive osseous lesion. CT ABDOMEN AND PELVIS FINDINGS Hepatobiliary: Small low-density lesions in liver unchanged from prior most consistent benign cysts. Normal gallbladder Pancreas: Pancreas is normal. No ductal dilatation. No pancreatic inflammation. Spleen: Normal spleen Adrenals/urinary tract: Adrenal glands normal. Nodule adjacent the pole of the RIGHT kidney measuring 12 mm compared to 15 mm. Stomach/Bowel: Stomach, small bowel, appendix, and cecum are normal. A segment of mucosal thickening and mild pericolonic inflammation centered about the hepatic flexure  (image 72/2. This is a relatively long segment which extends the ascending colon to in the mid transverse colon. No perforation or abscess. Descending colon normal. Rectum normal. Moderate volume stool throughout the colon similar prior. Vascular/Lymphatic: Abdominal aorta is normal caliber. There is no retroperitoneal or periportal lymphadenopathy. No pelvic lymphadenopathy. Reproductive: Prostate unremarkable Other: No peritoneal metastasis Musculoskeletal: No aggressive osseous lesion. IMPRESSION: Chest Impression: 1. New focus of nodular consolidation in the LEFT lobe is favored focus of pulmonary infection as lesion is not present on CT exam from 04/25/2021. Recommend short-term follow-up evaluation. 2. Stable to decreased size of large LEFT lower lobe pulmonary nodule. Abdomen / Pelvis Impression: 1. There is inflammation of the ascending colon and proximal transverse colon. Findings suggest segmental colitis with differential including inflammatory bowel disease, infectious colitis, or ischemic colitis. Findings new from comparison exam 2. Again demonstrated moderate volume stool. 3. RIGHT suprarenal nodule slightly decreased in size. 4. No hepatic metastasis. Electronically Signed   By: Suzy Bouchard M.D.   On: 07/22/2021 16:24     ASSESSMENT & PLAN:   Cancer of lower lobe of left lung (HCC) #Lung cancer-non-small cell stage IV [right suprarenal nodule-s/p biopsy "non-small cell ca"].  S/p  CarboTaxol plus Keytruda x4; currently on single agent Keytruda-  CT FEB 13th, 2023- Interval decrease in size of the posterior left lower lobe pulmonary lesion;  Interval decrease in size of the right suprarenal nodule; however focal left lower lobe new nodule noted [see below].    #Given the overall stability of the lung cancer; I think is reasonable to continue holding it would at this time [see below]; we will again reassess at next visit regarding starting [place].  # Congestion/ dyspnea/ cough- dry- ?  pneuamonia- LLL- CT FEB 13th, 2023- New focus of nodular consolidation in the LEFT lobe is favored focus of pulmonary infection as lesion is not present on CT exam from 04/25/2021. [Hx of smoking; quit 2010]- recommend inhalers; advair BID; prednisone 60 mg/ day x1 week; taper if tolerating well. plan Dex 4 mg/IVFs; continue prednisone; recommend Advair; albuterol; levofloxacin x10 days.   # Diarrhea- 3-4 weeks [2-3 BM loose]-  NONE now; FEB 2023- CT ascending colon inflammation-question Keytruda.  Given the absence of ongoing diarrhea I think is reasonable to keep monitoring.  Also patient on prednisone as above.   # ? Subtle adrenal insufficiency-[ No acute process - -September MRI brain negative for any pituitary hypophysitis/brain metastasis]. [Continue with long-term prednisone ] - see baove  # CAD-s/p CABG-/PVD-s/p stent [Dr.Schneir]-improvement in pain noted.  On Plavix --STABLE;  # IV access: Mediport functioning.  # DISPOSITION: # HOLD Keytruda today;  NO IVFs 1lit;  ONLY Dex 4 mg IVP today   # 1 week-Josh/ palliative care;labs- cbc/bmp possible   IVfs 1lit/dex  # Follow up in 3 weeks- MD; labs- cbc/cmp;TSH- possible Keytruda;- Dr.B     All  questions were answered. The patient knows to call the clinic with any problems, questions or concerns.    Cammie Sickle, MD 07/25/2021 10:34 AM

## 2021-07-25 NOTE — Telephone Encounter (Signed)
Patient has infusion appointment this morning at 9A but is feeling sick and may need to reschedule.He woulld like to talk with someone.

## 2021-07-25 NOTE — Assessment & Plan Note (Addendum)
#  Lung cancer-non-small cell stage IV [right suprarenal nodule-s/p biopsy "non-small cell ca"].  S/p  CarboTaxol plus Keytruda x4; currently on single agent Keytruda-  CT FEB 13th, 2023- Interval decrease in size of the posterior left lower lobe pulmonary lesion;  Interval decrease in size of the right suprarenal nodule; however focal left lower lobe new nodule noted [see below].   #Given the overall stability of the lung cancer; I think is reasonable to continue holding it would at this time [see below]; we will again reassess at next visit regarding starting [place].  # Congestion/ dyspnea/ cough- dry- ? pneuamonia- LLL- CT FEB 13th, 2023- New focus of nodular consolidation in the LEFT lobe is favored focus of pulmonary infection as lesion is not present on CT exam from 04/25/2021. [Hx of smoking; quit 2010]- recommend inhalers; advair BID; prednisone 60 mg/ day x1 week; taper if tolerating well. plan Dex 4 mg/IVFs; continue prednisone; recommend Advair; albuterol; levofloxacin x10 days.   # Diarrhea- 3-4 weeks [2-3 BM loose]-  NONE now; FEB 2023- CT ascending colon inflammation-question Keytruda.  Given the absence of ongoing diarrhea I think is reasonable to keep monitoring.  Also patient on prednisone as above.   # ? Subtle adrenal insufficiency-[ No acute process - -September MRI brain negative for any pituitary hypophysitis/brain metastasis]. [Continue with long-term prednisone ] - see baove  # CAD-s/p CABG-/PVD-s/p stent [Dr.Schneir]-improvement in pain noted.  On Plavix --STABLE;  # IV access: Mediport functioning.  # DISPOSITION: # HOLD Keytruda today;  NO IVFs 1lit; ONLY Dex 4 mg IVP today   # 1 week-[facility]/ palliative care;labs- cbc/bmp possible   IVfs 1lit/dex  # Follow up in 3 weeks- MD; labs- cbc/cmp;TSH- possible Keytruda;- Dr.B

## 2021-08-01 ENCOUNTER — Other Ambulatory Visit: Payer: Self-pay

## 2021-08-01 ENCOUNTER — Inpatient Hospital Stay: Payer: PPO

## 2021-08-01 ENCOUNTER — Encounter: Payer: Self-pay | Admitting: Hospice and Palliative Medicine

## 2021-08-01 ENCOUNTER — Inpatient Hospital Stay (HOSPITAL_BASED_OUTPATIENT_CLINIC_OR_DEPARTMENT_OTHER): Payer: PPO | Admitting: Hospice and Palliative Medicine

## 2021-08-01 VITALS — BP 153/78 | HR 96 | Resp 16

## 2021-08-01 VITALS — BP 147/71 | HR 83 | Temp 98.6°F | Resp 17 | Wt 213.6 lb

## 2021-08-01 DIAGNOSIS — C3432 Malignant neoplasm of lower lobe, left bronchus or lung: Secondary | ICD-10-CM | POA: Diagnosis not present

## 2021-08-01 LAB — BASIC METABOLIC PANEL
Anion gap: 9 (ref 5–15)
BUN: 28 mg/dL — ABNORMAL HIGH (ref 8–23)
CO2: 25 mmol/L (ref 22–32)
Calcium: 8.9 mg/dL (ref 8.9–10.3)
Chloride: 103 mmol/L (ref 98–111)
Creatinine, Ser: 1.03 mg/dL (ref 0.61–1.24)
GFR, Estimated: >60 mL/min (ref >60)
Glucose, Bld: 125 mg/dL — ABNORMAL HIGH (ref 70–99)
Potassium: 3.9 mmol/L (ref 3.5–5.1)
Sodium: 137 mmol/L (ref 135–145)

## 2021-08-01 MED ORDER — DEXAMETHASONE SODIUM PHOSPHATE 10 MG/ML IJ SOLN
4.0000 mg | Freq: Once | INTRAMUSCULAR | Status: AC
  Administered 2021-08-01: 4 mg via INTRAVENOUS
  Filled 2021-08-01: qty 1

## 2021-08-01 MED ORDER — SODIUM CHLORIDE 0.9% FLUSH
10.0000 mL | Freq: Once | INTRAVENOUS | Status: AC | PRN
  Administered 2021-08-01: 10 mL
  Filled 2021-08-01: qty 10

## 2021-08-01 MED ORDER — HEPARIN SOD (PORK) LOCK FLUSH 100 UNIT/ML IV SOLN
500.0000 [IU] | Freq: Once | INTRAVENOUS | Status: AC | PRN
  Administered 2021-08-01: 500 [IU]
  Filled 2021-08-01: qty 5

## 2021-08-01 MED ORDER — ONDANSETRON HCL 4 MG/2ML IJ SOLN
8.0000 mg | Freq: Once | INTRAMUSCULAR | Status: DC
  Filled 2021-08-01: qty 4

## 2021-08-01 MED ORDER — SODIUM CHLORIDE 0.9 % IV SOLN: Freq: Once | INTRAVENOUS | Status: DC

## 2021-08-01 MED ORDER — SODIUM CHLORIDE 0.9 % IV SOLN
Freq: Once | INTRAVENOUS | Status: AC
  Filled 2021-08-01: qty 250

## 2021-08-01 NOTE — Progress Notes (Signed)
Palliative care visit today. Receiving IVF's. Has 2 more days of Levaquin for URI. Denies nausea. Appetite is good this week.

## 2021-08-01 NOTE — Progress Notes (Signed)
[place] at [facility] [phone] [phone]   Name: Randall Ali. Date: 08/01/2021 MRN: [id number]  DOB: 1941/03/04  Patient Care Team: Ria Bush, MD as PCP - General (Family Medicine) [name] [name], MD as PCP - Cardiology (Cardiology) [name], MD (Internal Medicine) Minna Merritts, MD as Consulting Physician (Cardiology) Pieter Partridge, DO as Consulting Physician (Neurology) Cammie Sickle, MD as Consulting Physician (Hematology and Oncology) Telford Nab, RN as Oncology Nurse Navigator    REASON FOR CONSULTATION: Randall Ali. is a 81 y.o. male with multiple medical problems including CAD status post CABG, PVD status post stenting, stage IV non-small cell lung cancer currently initially diagnosed May 2022 status post treatment with chemo/immunotherapy.  Palliative care was consulted to address goals and manage ongoing symptoms.  SOCIAL HISTORY:     reports that he quit smoking about 21 years ago. His smoking use included cigarettes. He has a 100.00 pack-year smoking history. He has never used smokeless tobacco. He reports current alcohol use. He reports that he does not use drugs.  Patient lives at home alone.  He has 2 daughters and a son who live nearby.  Patient also has a sister who lives in the neighborhood.  Patient previously worked in [employer] and then retired from the [employer].  ADVANCE DIRECTIVES:  Not on file  CODE STATUS:   PAST MEDICAL HISTORY: Past Medical History:  Diagnosis Date   Allergy    seasonal   Arthritis    all over- in general    CAD (coronary artery disease)    a. inferior wall MI 10/01 s/p PCI/DES to RCA; b. Myoview 3/16 neg for ischemia; c. LHC 8/16: ostLAD 80%, OM1 70%, OM2 70% x 2 lesions, mRCA 30%, dRCA 70% s/p 4-V CABG 01/24/15 (LIMA-LAD, VG- OM1, VG-OM2, VG-PDA)    Cancer (HCC)    skin, melanoma   Carotid artery  disease ([place])    a. [place] 8/16: 1-39% bilateral ICA stenosis   Cataract    removed   Diastolic dysfunction    a. TTE 8/16: EF 55-60%, no RWMA, Gr1DD, calcified mitral annulus, mild biatrial enlargement   Erectile dysfunction    GERD (gastroesophageal reflux disease)    History of elbow surgery    History of hiatal hernia    HLD (hyperlipidemia)    HTN (hypertension)    Inferior myocardial infarction ([place]) 03/2000   stent RCA   Lung cancer ([place])    Postoperative wound infection 02/02/2015   Reflux esophagitis    Sleep apnea 2017   CPAP at night    PAST SURGICAL HISTORY:  Past Surgical History:  Procedure Laterality Date   arm surgery  2010   BROW LIFT Bilateral 11/25/2019   Procedure: BROW PTOSIS REPAIR BILATERAL;  Surgeon: Karle Starch, MD;  Location: [place];  Service: Ophthalmology;  Laterality: Bilateral;  sleep apnea   CARDIAC CATHETERIZATION  06/24/2011   CARDIAC CATHETERIZATION N/A 01/18/2015   Procedure: Left Heart Cath with coronary angiography;  Surgeon: Minna Merritts, MD;  Location: [place] [facility];  Service: Cardiovascular;  Laterality: N/A;   CARDIAC CATHETERIZATION N/A 01/18/2015   Procedure: Intravascular Pressure Wire/FFR Study;  Surgeon: [name], MD;  Location: [place] [facility];  Service: Cardiovascular;  Laterality: N/A;   CAROTID STENT  03/10/2011   COLONOSCOPY  2010   COLONOSCOPY  06/14/2014   Dr Hilarie Fredrickson   CORONARY ARTERY  BYPASS GRAFT N/A 01/24/2015   Procedure: CORONARY ARTERY BYPASS GRAFTING x 4 (LIMA-LAD, SVG-Int 1- Int 2, SVG-PD) ENDOSCOPIC GREATER SAPHENOUS VEIN HARVEST LEFT LEG;  Surgeon: [name], MD;  Location: [place];  Service: Open Heart Surgery;  Laterality: N/A;   EMBOLECTOMY  06/15/2019   Procedure: EMBOLECTOMY;  Surgeon: Katha Cabal, MD;  Location: [facility];  Service: Vascular;;  right superficial femoral artery   ENDARTERECTOMY FEMORAL Right 06/15/2019   Procedure: ENDARTERECTOMY FEMORAL;  Surgeon:  Katha Cabal, MD;  Location: [facility];  Service: Vascular;  Laterality: Right;  common femoral profunda femoris superficial femoral   ESOPHAGOGASTRODUODENOSCOPY (EGD) WITH PROPOFOL N/A 04/24/2016   Procedure: ESOPHAGOGASTRODUODENOSCOPY (EGD) WITH PROPOFOL;  Surgeon: Jerene Bears, MD;  Location: [facility];  Service: Gastroenterology;  Laterality: N/A;   EYE SURGERY     lasik 15 yrs. ago, cataracts removed - both eyes    HAMMER TOE SURGERY     right toe   INSERTION OF ILIAC STENT Right 06/15/2019   Procedure: INSERTION OF ILIAC STENT ( STENTING OF SFA/POP ARTERY );  Surgeon: Katha Cabal, MD;  Location: [facility];  Service: Vascular;  Laterality: Right;  angioplpasty and stent placement: right superficial femoral right tibiopopliteal trunk bilateral common iliac arteries   IR IMAGING GUIDED PORT INSERTION  11/09/2020   LEFT HEART CATH AND CORONARY ANGIOGRAPHY Left 06/10/2017   Procedure: LEFT HEART CATH AND CORONARY ANGIOGRAPHY;  Surgeon: Minna Merritts, MD;  Location: [place] [facility];  Service: Cardiovascular;  Laterality: Left;   LOWER EXTREMITY ANGIOGRAPHY Left 01/04/2019   Procedure: LOWER EXTREMITY ANGIOGRAPHY;  Surgeon: Katha Cabal, MD;  Location: [place] [facility];  Service: Cardiovascular;  Laterality: Left;   LOWER EXTREMITY ANGIOGRAPHY Right 01/25/2019   Procedure: LOWER EXTREMITY ANGIOGRAPHY;  Surgeon: Katha Cabal, MD;  Location: [place] [facility];  Service: Cardiovascular;  Laterality: Right;   LOWER EXTREMITY ANGIOGRAPHY Right 01/15/2021   LOWER EXTREMITY ANGIOGRAPHY and stent placement to R SFA and popliteal artery Delana Meyer, Dolores Lory, MD)   NASAL SINUS SURGERY  2008   septpolasty, bilateral turbinate reduction   SHOULDER ARTHROSCOPY  2012   TEE WITHOUT CARDIOVERSION N/A 01/24/2015   Procedure: TRANSESOPHAGEAL ECHOCARDIOGRAM (TEE);  Surgeon: [name], MD;  Location: [place];  Service: Open Heart Surgery;  Laterality: N/A;   TOE  SURGERY  1994   UPPER GI ENDOSCOPY  07/2014, 04-24-16   Dr Raquel James   WRIST SURGERY  2011    HEMATOLOGY/ONCOLOGY HISTORY:  Oncology History Overview Note  #MAY 2022-Lung cancer-non-small cell [CT guided bx] T3N1 vs stage IV Dr.Hendrickson.  MRI brain negative for malignancy.# 1. April 2022- LLL ~4.0 cm mass in the superior segment left lower lobe abuts the major fissure and the posterior pleural surface without visible chest wall invasion without pleural effusion. There 2-3 other small nodules in the left lower lobe, largest measures 9 mm in diameter. These are suspicious for same lobe satellite lesions and there is left hilar adenopathy. Assuming non-small cell lung cancer the appearance is compatible with T3 N1 M0 disease (stage IIIA).  # right suprarenal nodule-awaiting biopsy on 5/31- non-small cell [revived at tumor conference at [place]  # June 9th 2022- CARBO-TAXOl-KEYTRUDA; Fulphila  # ? Subtle adrenal insufficiency- No acute process - -September MRI brain negative for any pituitary hypophysitis/brain metastasis. Prednisone   MOLECULAR TESTING: NGS TPS-PDL 100%; EXON 12 amplification* mutations.    # CAD [CABG 2016; Dr.Gollan]   Cancer of lower lobe  of left lung ([place])  11/02/2020 Initial Diagnosis   Cancer of lower lobe of left lung ([place])   11/02/2020 Cancer Staging   Staging form: Lung, AJCC 8th Edition - Clinical: Stage IVA (cT3, cN1, cM1a) - Signed by Cammie Sickle, MD on 11/02/2020    11/16/2020 -  Chemotherapy   Patient is on Treatment Plan : LUNG NSCLC Carboplatin + Paclitaxel + Pembrolizumab q21d x 4 cycles / Pembrolizumab Maintenance Q21D       ALLERGIES:  is allergic to flomax [tamsulosin].  MEDICATIONS:  Current Outpatient Medications  Medication Sig Dispense Refill   acetaminophen (TYLENOL) 325 MG tablet Take 650 mg by mouth as needed for moderate pain or mild pain.     albuterol (VENTOLIN HFA) 108 (90 Base) MCG/ACT inhaler Inhale 2 puffs into the lungs  every 6 (six) hours as needed for wheezing or shortness of breath. 8 g 2   Ascorbic Acid (VITAMIN C) 1000 MG tablet Take 1,000 mg by mouth daily.     aspirin 81 MG EC tablet Take 81 mg by mouth daily.       Calcium-Magnesium-Vitamin D (CALCIUM 1200+D3 PO) Take 1 tablet by mouth daily.     Carboxymethylcellul-Glycerin (LUBRICATING EYE DROPS OP) Place 1 drop into both eyes daily as needed (irritation).     Cholecalciferol (VITAMIN D3) 50 MCG (2000 UT) TABS Take 2,000 Units by mouth daily.     Cyanocobalamin (B-12) 5000 MCG CAPS Take 5,000 mcg by mouth daily.     docusate sodium (COLACE) 100 MG capsule Take 200 mg by mouth at bedtime.     fluticasone-salmeterol (ADVAIR HFA) 115-21 MCG/ACT inhaler Inhale 2 puffs into the lungs 2 (two) times daily. 1 each 12   levofloxacin (LEVAQUIN) 500 MG tablet Take 1 tablet (500 mg total) by mouth daily. 10 tablet 0   lidocaine-prilocaine (EMLA) cream Apply 30 -45 mins prior to port access. 30 g 3   lisinopril (ZESTRIL) 5 MG tablet Take 1 tablet (5 mg total) by mouth daily. 90 tablet 3   Melatonin 10 MG CAPS Take 10 mg by mouth at bedtime as needed (sleep).     nitroGLYCERIN (NITROSTAT) 0.4 MG SL tablet Place 1 tablet (0.4 mg total) under the tongue every 5 (five) minutes as needed for up to 25 doses for chest pain. 25 tablet 1   pantoprazole (PROTONIX) 40 MG tablet TAKE 1 TABLET BY MOUTH DAILY 90 tablet 3   predniSONE (DELTASONE) 20 MG tablet Take 3 tablets (60 mg total) by mouth daily with breakfast. 3 pills once a day with breakfast-do not stop until next visit/1 week 60 tablet 0   prochlorperazine (COMPAZINE) 10 MG tablet Take 1 tablet (10 mg total) by mouth every 6 (six) hours as needed for nausea or vomiting. 40 tablet 1   simvastatin (ZOCOR) 40 MG tablet TAKE 1 TABLET BY MOUTH AT BEDTIME 90 tablet 0   traMADol (ULTRAM) 50 MG tablet TAKE 1 TABLET BY MOUTH 3 TIMES DAILY AS NEEDED FOR MODERATE PAIN 90 tablet 0   triamcinolone ointment (KENALOG) 0.5 %       Vitamin E 450 MG (1000 UT) CAPS Take 450 Units by mouth daily.     Zinc 50 MG TABS Take 50 mg by mouth daily.     doxycycline (VIBRA-TABS) 100 MG tablet Take 100 mg by mouth 2 (two) times daily. (Patient not taking: Reported on 07/25/2021)     montelukast (SINGULAIR) 10 MG tablet Take 1 tablet (10 mg total) by mouth at  bedtime. (Patient not taking: Reported on 07/25/2021) 30 tablet 1   No current facility-administered medications for this visit.   Facility-Administered Medications Ordered in Other Visits  Medication Dose Route Frequency Provider Last Rate Last Admin   heparin lock flush 100 UNIT/ML injection            ondansetron (ZOFRAN) injection 8 mg  8 mg Intravenous Once Cammie Sickle, MD        VITAL SIGNS: BP (!) 147/71 (BP Location: Left Arm, Patient Position: Sitting)    Pulse 83    Temp 98.6 F (37 C) (Oral)    Resp 17    Wt 213 lb 9.6 oz (96.9 kg)    SpO2 95%    BMI 32.48 kg/m  Filed Weights   08/01/21 1058  Weight: 213 lb 9.6 oz (96.9 kg)    Estimated body mass index is 32.48 kg/m as calculated from the following:   Height as of 07/15/21: _0  (1.727 m).   Weight as of this encounter: 213 lb 9.6 oz (96.9 kg).  LABS: CBC:    Component Value Date/Time   WBC 8.7 07/25/2021 0925   HGB 14.6 07/25/2021 0925   HGB 14.8 01/15/2015 0956   HCT 43.3 07/25/2021 0925   HCT 44.2 01/15/2015 0956   PLT 164 07/25/2021 0925   PLT 207 01/15/2015 0956   MCV 95.0 07/25/2021 0925   MCV 91 01/15/2015 0956   NEUTROABS 6.2 07/25/2021 0925   NEUTROABS 5.2 01/15/2015 0956   LYMPHSABS 1.3 07/25/2021 0925   LYMPHSABS 1.9 01/15/2015 0956   MONOABS 0.9 07/25/2021 0925   EOSABS 0.2 07/25/2021 0925   EOSABS 0.2 01/15/2015 0956   BASOSABS 0.0 07/25/2021 0925   BASOSABS 0.0 01/15/2015 0956   Comprehensive Metabolic Panel:    Component Value Date/Time   NA 137 08/01/2021 1032   NA 138 01/15/2015 0956   K 3.9 08/01/2021 1032   CL 103 08/01/2021 1032   CO2 25 08/01/2021 1032    BUN 28 (H) 08/01/2021 1032   BUN 16 01/15/2015 0956   CREATININE 1.03 08/01/2021 1032   GLUCOSE 125 (H) 08/01/2021 1032   CALCIUM 8.9 08/01/2021 1032   AST 16 07/25/2021 0925   ALT 15 07/25/2021 0925   ALKPHOS 44 07/25/2021 0925   BILITOT 0.1 (L) 07/25/2021 0925   PROT 7.0 07/25/2021 0925   ALBUMIN 3.8 07/25/2021 0925    RADIOGRAPHIC STUDIES: DG Chest 2 View  Result Date: 07/04/2021 CLINICAL DATA:  Cough and shortness of breath times 2-3 months. History of lung cancer. EXAM: CHEST - 2 VIEW COMPARISON:  Oct 18, 2020 radiograph and CT April 25, 2021 FINDINGS: Right chest Port-A-Cath with tip at the superior cavoatrial junction. Prior median sternotomy and CABG. The heart size and mediastinal contours are within normal limits. Known left lower lobe pulmonary lesion is not well seen on today's examination. Chronic bronchitic lung changes. No visible pleural effusion or pneumothorax. The visualized skeletal structures are unremarkable. IMPRESSION: 1. No acute radiographic finding. 2. Known left lower lobe pulmonary lesion is not well seen on today's examination. Electronically Signed   By: Dahlia Bailiff M.D.   On: 07/04/2021 12:55   CT CHEST ABDOMEN PELVIS W CONTRAST  Result Date: 07/22/2021 CLINICAL DATA:  Non-small cell lung cancer. EXAM: CT CHEST, ABDOMEN, AND PELVIS WITH CONTRAST TECHNIQUE: Multidetector CT imaging of the chest, abdomen and pelvis was performed following the standard protocol during bolus administration of intravenous contrast. RADIATION DOSE REDUCTION: This exam was performed according  to the departmental dose-optimization program which includes automated exposure control, adjustment of the mA and/or kV according to patient size and/or use of iterative reconstruction technique. CONTRAST:  191m OMNIPAQUE IOHEXOL 300 MG/ML  SOLN COMPARISON:  CT 04/25/2021 FINDINGS: CT CHEST FINDINGS Cardiovascular: Port in the anterior chest wall with tip in distal SVC. Post CABG  Mediastinum/Nodes: No axillary or supraclavicular adenopathy. No mediastinal or hilar adenopathy. No pericardial fluid. Esophagus normal. Lungs/Pleura: Rounded nodule along the pleural surface of the LEFT lower lobe measures 19 mm by 15 mm compared with 12 x 19 mm on comparison exam. New focus of consolidation in the LEFT lower lobe posterior to fissure measures 1.8 x 1.5 cm (image 106/3). This focus consolidation has a segmental pattern with linear extension towards the periphery of the lung (image 118/4). Centrally the lesion is more nodular appearance (image 121/4) Musculoskeletal: No aggressive osseous lesion. CT ABDOMEN AND PELVIS FINDINGS Hepatobiliary: Small low-density lesions in liver unchanged from prior most consistent benign cysts. Normal gallbladder Pancreas: Pancreas is normal. No ductal dilatation. No pancreatic inflammation. Spleen: Normal spleen Adrenals/urinary tract: Adrenal glands normal. Nodule adjacent the pole of the RIGHT kidney measuring 12 mm compared to 15 mm. Stomach/Bowel: Stomach, small bowel, appendix, and cecum are normal. A segment of mucosal thickening and mild pericolonic inflammation centered about the hepatic flexure (image 72/2. This is a relatively long segment which extends the ascending colon to in the mid transverse colon. No perforation or abscess. Descending colon normal. Rectum normal. Moderate volume stool throughout the colon similar prior. Vascular/Lymphatic: Abdominal aorta is normal caliber. There is no retroperitoneal or periportal lymphadenopathy. No pelvic lymphadenopathy. Reproductive: Prostate unremarkable Other: No peritoneal metastasis Musculoskeletal: No aggressive osseous lesion. IMPRESSION: Chest Impression: 1. New focus of nodular consolidation in the LEFT lobe is favored focus of pulmonary infection as lesion is not present on CT exam from 04/25/2021. Recommend short-term follow-up evaluation. 2. Stable to decreased size of large LEFT lower lobe pulmonary  nodule. Abdomen / Pelvis Impression: 1. There is inflammation of the ascending colon and proximal transverse colon. Findings suggest segmental colitis with differential including inflammatory bowel disease, infectious colitis, or ischemic colitis. Findings new from comparison exam 2. Again demonstrated moderate volume stool. 3. RIGHT suprarenal nodule slightly decreased in size. 4. No hepatic metastasis. Electronically Signed   By: SSuzy BouchardM.D.   On: 07/22/2021 16:24    PERFORMANCE STATUS (ECOG) : 2 - Symptomatic, <50% confined to bed  Review of Systems Unless otherwise noted, a complete review of systems is negative.  Physical Exam General: NAD Cardiovascular: regular rate and rhythm Pulmonary: clear anterior/posterior fields Abdomen: soft, nontender, + bowel sounds GU: no suprapubic tenderness Extremities: no edema, no joint deformities Skin: no rashes Neurological: Weakness but otherwise nonfocal  IMPRESSION: I met with patient and daughter.  Patient is currently on antibiotics for URI.  He feels like he is improving with less congestion/shortness of breath.  He reports improved appetite on steroids.  He remains fatigued with bilateral leg weakness but says that he is questioning if this is attributed to his peripheral vascular disease and he plans to see the vascular surgeon.  Patient has been quite active historically going to the gym and playing softball.  However, this was impacted by treatment causing significant fatigue.  Treatment is currently on hold.  Patient lives at home alone remains independent with his own care.  He has a large and supportive family.  Patient plans to bring in ACP documents to be scanned into  the chart.  I sent him home with a MOST form to review with family.  PLAN: -Continue current scope of treatment -Patient to bring in ACP documents -Patient sent home with a MOST form -Follow-up telephone visit 1 month   Patient expressed understanding  and was in agreement with this plan. He also understands that He can call the clinic at any time with any questions, concerns, or complaints.     Time Total: 15 minutes  Visit consisted of counseling and education dealing with the complex and emotionally intense issues of symptom management and palliative care in the setting of serious and potentially life-threatening illness.Greater than 50%  of this time was spent counseling and coordinating care related to the above assessment and plan.  Signed by: Altha Harm, PhD, NP-C

## 2021-08-05 ENCOUNTER — Ambulatory Visit (INDEPENDENT_AMBULATORY_CARE_PROVIDER_SITE_OTHER): Payer: PPO

## 2021-08-05 ENCOUNTER — Telehealth: Payer: Self-pay | Admitting: *Deleted

## 2021-08-05 ENCOUNTER — Other Ambulatory Visit: Payer: Self-pay

## 2021-08-05 ENCOUNTER — Ambulatory Visit (INDEPENDENT_AMBULATORY_CARE_PROVIDER_SITE_OTHER): Payer: PPO | Admitting: Vascular Surgery

## 2021-08-05 ENCOUNTER — Encounter (INDEPENDENT_AMBULATORY_CARE_PROVIDER_SITE_OTHER): Payer: Self-pay | Admitting: Vascular Surgery

## 2021-08-05 VITALS — BP 110/65 | HR 80 | Resp 16 | Wt 214.4 lb

## 2021-08-05 DIAGNOSIS — E782 Mixed hyperlipidemia: Secondary | ICD-10-CM

## 2021-08-05 DIAGNOSIS — I2510 Atherosclerotic heart disease of native coronary artery without angina pectoris: Secondary | ICD-10-CM

## 2021-08-05 DIAGNOSIS — I6523 Occlusion and stenosis of bilateral carotid arteries: Secondary | ICD-10-CM

## 2021-08-05 DIAGNOSIS — I70229 Atherosclerosis of native arteries of extremities with rest pain, unspecified extremity: Secondary | ICD-10-CM | POA: Diagnosis not present

## 2021-08-05 DIAGNOSIS — I70213 Atherosclerosis of native arteries of extremities with intermittent claudication, bilateral legs: Secondary | ICD-10-CM | POA: Diagnosis not present

## 2021-08-05 DIAGNOSIS — I10 Essential (primary) hypertension: Secondary | ICD-10-CM

## 2021-08-05 NOTE — Telephone Encounter (Signed)
Pt notified, appt made

## 2021-08-05 NOTE — Telephone Encounter (Signed)
Pt contacted and scheduled for labs/IVF on Tuesday, 2/28.

## 2021-08-05 NOTE — Telephone Encounter (Signed)
Patient called reporting that he came in for IV fluids last week and felt really good the day after, but has "felt bad" all weekend Non specific complaint. He is asking what can be dine for him. Please Randall Ali

## 2021-08-06 ENCOUNTER — Inpatient Hospital Stay: Payer: PPO

## 2021-08-06 ENCOUNTER — Other Ambulatory Visit: Payer: Self-pay | Admitting: Family Medicine

## 2021-08-06 VITALS — BP 109/56 | HR 72 | Temp 98.3°F | Resp 18

## 2021-08-06 DIAGNOSIS — C3432 Malignant neoplasm of lower lobe, left bronchus or lung: Secondary | ICD-10-CM | POA: Diagnosis not present

## 2021-08-06 LAB — CBC WITH DIFFERENTIAL/PLATELET
Abs Immature Granulocytes: 0.3 10*3/uL — ABNORMAL HIGH (ref 0.00–0.07)
Basophils Absolute: 0.1 10*3/uL (ref 0.0–0.1)
Basophils Relative: 0 %
Eosinophils Absolute: 0 10*3/uL (ref 0.0–0.5)
Eosinophils Relative: 0 %
HCT: 42.8 % (ref 39.0–52.0)
Hemoglobin: 14.1 g/dL (ref 13.0–17.0)
Immature Granulocytes: 2 %
Lymphocytes Relative: 8 %
Lymphs Abs: 1.3 10*3/uL (ref 0.7–4.0)
MCH: 31.4 pg (ref 26.0–34.0)
MCHC: 32.9 g/dL (ref 30.0–36.0)
MCV: 95.3 fL (ref 80.0–100.0)
Monocytes Absolute: 0.7 10*3/uL (ref 0.1–1.0)
Monocytes Relative: 5 %
Neutro Abs: 12.8 10*3/uL — ABNORMAL HIGH (ref 1.7–7.7)
Neutrophils Relative %: 85 %
Platelets: 220 10*3/uL (ref 150–400)
RBC: 4.49 MIL/uL (ref 4.22–5.81)
RDW: 13.1 % (ref 11.5–15.5)
WBC: 15.1 10*3/uL — ABNORMAL HIGH (ref 4.0–10.5)
nRBC: 0 % (ref 0.0–0.2)

## 2021-08-06 LAB — COMPREHENSIVE METABOLIC PANEL
ALT: 17 U/L (ref 0–44)
AST: 17 U/L (ref 15–41)
Albumin: 3.5 g/dL (ref 3.5–5.0)
Alkaline Phosphatase: 38 U/L (ref 38–126)
Anion gap: 6 (ref 5–15)
BUN: 25 mg/dL — ABNORMAL HIGH (ref 8–23)
CO2: 27 mmol/L (ref 22–32)
Calcium: 8.8 mg/dL — ABNORMAL LOW (ref 8.9–10.3)
Chloride: 102 mmol/L (ref 98–111)
Creatinine, Ser: 1.12 mg/dL (ref 0.61–1.24)
GFR, Estimated: >60 mL/min (ref >60)
Glucose, Bld: 136 mg/dL — ABNORMAL HIGH (ref 70–99)
Potassium: 3.6 mmol/L (ref 3.5–5.1)
Sodium: 135 mmol/L (ref 135–145)
Total Bilirubin: 0.5 mg/dL (ref 0.3–1.2)
Total Protein: 6.1 g/dL — ABNORMAL LOW (ref 6.5–8.1)

## 2021-08-06 MED ORDER — DEXAMETHASONE SODIUM PHOSPHATE 10 MG/ML IJ SOLN
4.0000 mg | Freq: Once | INTRAMUSCULAR | Status: AC
  Administered 2021-08-06: 4 mg via INTRAVENOUS
  Filled 2021-08-06: qty 1

## 2021-08-06 MED ORDER — ONDANSETRON HCL 4 MG/2ML IJ SOLN
8.0000 mg | Freq: Once | INTRAMUSCULAR | Status: DC
  Filled 2021-08-06: qty 4

## 2021-08-06 MED ORDER — SODIUM CHLORIDE 0.9% FLUSH
10.0000 mL | Freq: Once | INTRAVENOUS | Status: DC | PRN
  Filled 2021-08-06: qty 10

## 2021-08-06 MED ORDER — HEPARIN SOD (PORK) LOCK FLUSH 100 UNIT/ML IV SOLN
500.0000 [IU] | Freq: Once | INTRAVENOUS | Status: AC | PRN
  Administered 2021-08-06: 500 [IU]
  Filled 2021-08-06: qty 5

## 2021-08-06 MED ORDER — SODIUM CHLORIDE 0.9 % IV SOLN
Freq: Once | INTRAVENOUS | Status: AC
  Filled 2021-08-06: qty 250

## 2021-08-06 MED ORDER — SODIUM CHLORIDE 0.9 % IV SOLN: Freq: Once | INTRAVENOUS | Status: DC

## 2021-08-06 NOTE — Progress Notes (Signed)
Pt here for IV fluids. Reports that he feels very fatigued. Denies dizziness, SOB, N/V. States " I just don't feel well".

## 2021-08-06 NOTE — Telephone Encounter (Signed)
Refill request Tramadol Last refill 07/15/21 #90 Last office visit 07/15/21

## 2021-08-07 ENCOUNTER — Encounter (INDEPENDENT_AMBULATORY_CARE_PROVIDER_SITE_OTHER): Payer: Self-pay | Admitting: Vascular Surgery

## 2021-08-07 DIAGNOSIS — M62830 Muscle spasm of back: Secondary | ICD-10-CM | POA: Diagnosis not present

## 2021-08-07 DIAGNOSIS — M955 Acquired deformity of pelvis: Secondary | ICD-10-CM | POA: Diagnosis not present

## 2021-08-07 DIAGNOSIS — M9903 Segmental and somatic dysfunction of lumbar region: Secondary | ICD-10-CM | POA: Diagnosis not present

## 2021-08-07 DIAGNOSIS — M9905 Segmental and somatic dysfunction of pelvic region: Secondary | ICD-10-CM | POA: Diagnosis not present

## 2021-08-07 NOTE — Telephone Encounter (Signed)
ERx 

## 2021-08-07 NOTE — Progress Notes (Signed)
MRN : [id number]  Randall Ali. is a 81 y.o. (March 18, 1941) male who presents with chief complaint of check circulation.  History of Present Illness:   The patient returns to the office for followup and review status post angiogram with intervention on 01/15/2021.   Procedure: Percutaneous transluminal angioplasty and stent placement right superficial femoral artery and popliteal  The patient notes improvement in the lower extremity symptoms. No interval shortening of the patient's claudication distance or rest pain symptoms. Previous wounds have now healed.  No new ulcers or wounds have occurred since the last visit.   There have been no significant changes to the patient's overall health care.   The patient denies amaurosis fugax or recent TIA symptoms. There are no recent neurological changes noted. The patient denies history of DVT, PE or superficial thrombophlebitis. The patient denies recent episodes of angina or shortness of breath.    ABI's Rt=1.13 and Lt=1.08  (previous ABI's Rt=1.10 and Lt=0.98). Duplex ultrasound of the right leg shows uniform velocities with widely patent stent  Current Meds  Medication Sig   acetaminophen (TYLENOL) 325 MG tablet Take 650 mg by mouth as needed for moderate pain or mild pain.   albuterol (VENTOLIN HFA) 108 (90 Base) MCG/ACT inhaler Inhale 2 puffs into the lungs every 6 (six) hours as needed for wheezing or shortness of breath.   Ascorbic Acid (VITAMIN C) 1000 MG tablet Take 1,000 mg by mouth daily.   aspirin 81 MG EC tablet Take 81 mg by mouth daily.     Calcium-Magnesium-Vitamin D (CALCIUM 1200+D3 PO) Take 1 tablet by mouth daily.   Carboxymethylcellul-Glycerin (LUBRICATING EYE DROPS OP) Place 1 drop into both eyes daily as needed (irritation).   Cholecalciferol (VITAMIN D3) 50 MCG (2000 UT) TABS Take 2,000 Units by mouth daily.   Cyanocobalamin (B-12) 5000 MCG CAPS Take 5,000 mcg by mouth daily.   docusate sodium (COLACE) 100 MG  capsule Take 200 mg by mouth at bedtime.   fluticasone-salmeterol (ADVAIR HFA) 115-21 MCG/ACT inhaler Inhale 2 puffs into the lungs 2 (two) times daily.   levofloxacin (LEVAQUIN) 500 MG tablet Take 1 tablet (500 mg total) by mouth daily.   lidocaine-prilocaine (EMLA) cream Apply 30 -45 mins prior to port access.   lisinopril (ZESTRIL) 5 MG tablet Take 1 tablet (5 mg total) by mouth daily.   Melatonin 10 MG CAPS Take 10 mg by mouth at bedtime as needed (sleep).   nitroGLYCERIN (NITROSTAT) 0.4 MG SL tablet Place 1 tablet (0.4 mg total) under the tongue every 5 (five) minutes as needed for up to 25 doses for chest pain.   pantoprazole (PROTONIX) 40 MG tablet TAKE 1 TABLET BY MOUTH DAILY   predniSONE (DELTASONE) 20 MG tablet Take 3 tablets (60 mg total) by mouth daily with breakfast. 3 pills once a day with breakfast-do not stop until next visit/1 week   prochlorperazine (COMPAZINE) 10 MG tablet Take 1 tablet (10 mg total) by mouth every 6 (six) hours as needed for nausea or vomiting.   simvastatin (ZOCOR) 40 MG tablet TAKE 1 TABLET BY MOUTH AT BEDTIME   traMADol (ULTRAM) 50 MG tablet TAKE 1 TABLET BY MOUTH 3 TIMES DAILY AS NEEDED FOR MODERATE PAIN   triamcinolone ointment (KENALOG) 0.5 %    Vitamin E 450 MG (1000 UT) CAPS Take 450 Units by mouth daily.   Zinc 50 MG TABS Take 50 mg by mouth daily.    Past Medical History:  Diagnosis Date   Allergy  seasonal   Arthritis    all over- in general    CAD (coronary artery disease)    a. inferior wall MI 10/01 s/p PCI/DES to RCA; b. Myoview 3/16 neg for ischemia; c. LHC 8/16: ostLAD 80%, OM1 70%, OM2 70% x 2 lesions, mRCA 30%, dRCA 70% s/p 4-V CABG 01/24/15 (LIMA-LAD, VG- OM1, VG-OM2, VG-PDA)    Cancer (HCC)    skin, melanoma   Carotid artery disease ([place])    a. [place] 8/16: 1-39% bilateral ICA stenosis   Cataract    removed   Diastolic dysfunction    a. TTE 8/16: EF 55-60%, no RWMA, Gr1DD, calcified mitral annulus, mild biatrial enlargement    Erectile dysfunction    GERD (gastroesophageal reflux disease)    History of elbow surgery    History of hiatal hernia    HLD (hyperlipidemia)    HTN (hypertension)    Inferior myocardial infarction ([place]) 03/2000   stent RCA   Lung cancer ([place])    Postoperative wound infection 02/02/2015   Reflux esophagitis    Sleep apnea 2017   CPAP at night    Past Surgical History:  Procedure Laterality Date   arm surgery  2010   BROW LIFT Bilateral 11/25/2019   Procedure: BROW PTOSIS REPAIR BILATERAL;  Surgeon: Karle Starch, MD;  Location: [place];  Service: Ophthalmology;  Laterality: Bilateral;  sleep apnea   CARDIAC CATHETERIZATION  06/24/2011   CARDIAC CATHETERIZATION N/A 01/18/2015   Procedure: Left Heart Cath with coronary angiography;  Surgeon: Minna Merritts, MD;  Location: [place] [facility];  Service: Cardiovascular;  Laterality: N/A;   CARDIAC CATHETERIZATION N/A 01/18/2015   Procedure: Intravascular Pressure Wire/FFR Study;  Surgeon: [name], MD;  Location: [place] [facility];  Service: Cardiovascular;  Laterality: N/A;   CAROTID STENT  03/10/2011   COLONOSCOPY  2010   COLONOSCOPY  06/14/2014   Dr Hilarie Fredrickson   CORONARY ARTERY BYPASS GRAFT N/A 01/24/2015   Procedure: CORONARY ARTERY BYPASS GRAFTING x 4 (LIMA-LAD, SVG-Int 1- Int 2, SVG-PD) ENDOSCOPIC GREATER SAPHENOUS VEIN HARVEST LEFT LEG;  Surgeon: [name], MD;  Location: [place];  Service: Open Heart Surgery;  Laterality: N/A;   EMBOLECTOMY  06/15/2019   Procedure: EMBOLECTOMY;  Surgeon: Katha Cabal, MD;  Location: [facility];  Service: Vascular;;  right superficial femoral artery   ENDARTERECTOMY FEMORAL Right 06/15/2019   Procedure: ENDARTERECTOMY FEMORAL;  Surgeon: Katha Cabal, MD;  Location: [facility];  Service: Vascular;  Laterality: Right;  common femoral profunda femoris superficial femoral   ESOPHAGOGASTRODUODENOSCOPY (EGD) WITH PROPOFOL N/A 04/24/2016   Procedure:  ESOPHAGOGASTRODUODENOSCOPY (EGD) WITH PROPOFOL;  Surgeon: Jerene Bears, MD;  Location: [facility];  Service: Gastroenterology;  Laterality: N/A;   EYE SURGERY     lasik 15 yrs. ago, cataracts removed - both eyes    HAMMER TOE SURGERY     right toe   INSERTION OF ILIAC STENT Right 06/15/2019   Procedure: INSERTION OF ILIAC STENT ( STENTING OF SFA/POP ARTERY );  Surgeon: Katha Cabal, MD;  Location: [facility];  Service: Vascular;  Laterality: Right;  angioplpasty and stent placement: right superficial femoral right tibiopopliteal trunk bilateral common iliac arteries   IR IMAGING GUIDED PORT INSERTION  11/09/2020   LEFT HEART CATH AND CORONARY ANGIOGRAPHY Left 06/10/2017   Procedure: LEFT HEART CATH AND CORONARY ANGIOGRAPHY;  Surgeon: Minna Merritts, MD;  Location: [place] [facility];  Service: Cardiovascular;  Laterality: Left;   LOWER  EXTREMITY ANGIOGRAPHY Left 01/04/2019   Procedure: LOWER EXTREMITY ANGIOGRAPHY;  Surgeon: Katha Cabal, MD;  Location: [place] [facility];  Service: Cardiovascular;  Laterality: Left;   LOWER EXTREMITY ANGIOGRAPHY Right 01/25/2019   Procedure: LOWER EXTREMITY ANGIOGRAPHY;  Surgeon: Katha Cabal, MD;  Location: [place] [facility];  Service: Cardiovascular;  Laterality: Right;   LOWER EXTREMITY ANGIOGRAPHY Right 01/15/2021   LOWER EXTREMITY ANGIOGRAPHY and stent placement to R SFA and popliteal artery Delana Meyer, Dolores Lory, MD)   NASAL SINUS SURGERY  2008   septpolasty, bilateral turbinate reduction   SHOULDER ARTHROSCOPY  2012   TEE WITHOUT CARDIOVERSION N/A 01/24/2015   Procedure: TRANSESOPHAGEAL ECHOCARDIOGRAM (TEE);  Surgeon: [name], MD;  Location: [place];  Service: Open Heart Surgery;  Laterality: N/A;   TOE SURGERY  1994   UPPER GI ENDOSCOPY  07/2014, 04-24-16   Dr Raquel James   WRIST SURGERY  2011    Social History Social History   Tobacco Use   Smoking status: Former    Packs/day: 2.50    Years: 40.00    Pack  years: 100.00    Types: Cigarettes    Quit date: 03/24/2000    Years since quitting: 21.3   Smokeless tobacco: Never  Vaping Use   Vaping Use: Never used  Substance Use Topics   Alcohol use: Yes    Comment: occassionally   Drug use: No    Family History Family History  Problem Relation Age of Onset   Hypertension Mother    Heart disease Mother    Hypertension Father    Diabetes Father    Lymphoma Sister    Heart disease Brother 5   Cancer Paternal Grandfather    Colon cancer Neg Hx    Prostate cancer Neg Hx    Bladder Cancer Neg Hx    Kidney cancer Neg Hx     Allergies  Allergen Reactions   Flomax [Tamsulosin] Other (See Comments)    Orthostatic dizziness     REVIEW OF SYSTEMS (Negative unless checked)  Constitutional: [] Weight loss  [] Fever  [] Chills Cardiac: [] Chest pain   [] Chest pressure   [] Palpitations   [] Shortness of breath when laying flat   [] Shortness of breath with exertion. Vascular:  [] Pain in legs with walking   [] Pain in legs at rest  [] History of DVT   [] Phlebitis   [] Swelling in legs   [] Varicose veins   [] Non-healing ulcers Pulmonary:   [] Uses home oxygen   [] Productive cough   [] Hemoptysis   [] Wheeze  [] COPD   [] Asthma Neurologic:  [] Dizziness   [] Seizures   [] History of stroke   [] History of TIA  [] Aphasia   [] Vissual changes   [] Weakness or numbness in arm   [] Weakness or numbness in leg Musculoskeletal:   [] Joint swelling   [] Joint pain   [] Low back pain Hematologic:  [] Easy bruising  [] Easy bleeding   [] Hypercoagulable state   [] Anemic Gastrointestinal:  [] Diarrhea   [] Vomiting  [] Gastroesophageal reflux/heartburn   [] Difficulty swallowing. Genitourinary:  [] Chronic kidney disease   [] Difficult urination  [] Frequent urination   [] Blood in urine Skin:  [] Rashes   [] Ulcers  Psychological:  [] History of anxiety   []  History of major depression.  Physical Examination  Vitals:   08/05/21 1447  BP: 110/65  Pulse: 80  Resp: 16  Weight: 214 lb  6.4 oz (97.3 kg)   Body mass index is 32.6 kg/m. Gen: WD/WN, NAD Head: [place]/AT, No temporalis wasting.  Ear/Nose/Throat: Hearing grossly intact, nares  w/o erythema or drainage Eyes: PER, EOMI, sclera nonicteric.  Neck: Supple, no masses.  No bruit or JVD.  Pulmonary:  Good air movement, no audible wheezing, no use of accessory muscles.  Cardiac: RRR, normal S1, S2, no Murmurs. Vascular:   Vessel Right Left  Radial Palpable Palpable  PT Palpable Palpable  DP Palpable Palpable  Gastrointestinal: soft, non-distended. No guarding/no peritoneal signs.  Musculoskeletal: M/S 5/5 throughout.  No visible deformity.  Neurologic: CN 2-12 intact. Pain and light touch intact in extremities.  Symmetrical.  Speech is fluent. Motor exam as listed above. Psychiatric: Judgment intact, Mood & affect appropriate for pt's clinical situation. Dermatologic: No rashes or ulcers noted.  No changes consistent with cellulitis.   CBC Lab Results  Component Value Date   WBC 15.1 (H) 08/06/2021   HGB 14.1 08/06/2021   HCT 42.8 08/06/2021   MCV 95.3 08/06/2021   PLT 220 08/06/2021    BMET    Component Value Date/Time   NA 135 08/06/2021 0948   NA 138 01/15/2015 0956   K 3.6 08/06/2021 0948   CL 102 08/06/2021 0948   CO2 27 08/06/2021 0948   GLUCOSE 136 (H) 08/06/2021 0948   BUN 25 (H) 08/06/2021 0948   BUN 16 01/15/2015 0956   CREATININE 1.12 08/06/2021 0948   CALCIUM 8.8 (L) 08/06/2021 0948   GFRNONAA >60 08/06/2021 0948   GFRAA >60 06/16/2019 1126   Estimated Creatinine Clearance: 60.5 mL/min (by C-G formula based on SCr of 1.12 mg/dL).  COAG Lab Results  Component Value Date   INR 1.1 11/06/2020   INR 1.1 10/18/2020   INR 1.1 10/12/2020    Radiology CT CHEST ABDOMEN PELVIS W CONTRAST  Result Date: 07/22/2021 CLINICAL DATA:  Non-small cell lung cancer. EXAM: CT CHEST, ABDOMEN, AND PELVIS WITH CONTRAST TECHNIQUE: Multidetector CT imaging of the chest, abdomen and pelvis was performed  following the standard protocol during bolus administration of intravenous contrast. RADIATION DOSE REDUCTION: This exam was performed according to the departmental dose-optimization program which includes automated exposure control, adjustment of the mA and/or kV according to patient size and/or use of iterative reconstruction technique. CONTRAST:  156mL OMNIPAQUE IOHEXOL 300 MG/ML  SOLN COMPARISON:  CT 04/25/2021 FINDINGS: CT CHEST FINDINGS Cardiovascular: Port in the anterior chest wall with tip in distal SVC. Post CABG Mediastinum/Nodes: No axillary or supraclavicular adenopathy. No mediastinal or hilar adenopathy. No pericardial fluid. Esophagus normal. Lungs/Pleura: Rounded nodule along the pleural surface of the LEFT lower lobe measures 19 mm by 15 mm compared with 12 x 19 mm on comparison exam. New focus of consolidation in the LEFT lower lobe posterior to fissure measures 1.8 x 1.5 cm (image 106/3). This focus consolidation has a segmental pattern with linear extension towards the periphery of the lung (image 118/4). Centrally the lesion is more nodular appearance (image 121/4) Musculoskeletal: No aggressive osseous lesion. CT ABDOMEN AND PELVIS FINDINGS Hepatobiliary: Small low-density lesions in liver unchanged from prior most consistent benign cysts. Normal gallbladder Pancreas: Pancreas is normal. No ductal dilatation. No pancreatic inflammation. Spleen: Normal spleen Adrenals/urinary tract: Adrenal glands normal. Nodule adjacent the pole of the RIGHT kidney measuring 12 mm compared to 15 mm. Stomach/Bowel: Stomach, small bowel, appendix, and cecum are normal. A segment of mucosal thickening and mild pericolonic inflammation centered about the hepatic flexure (image 72/2. This is a relatively long segment which extends the ascending colon to in the mid transverse colon. No perforation or abscess. Descending colon normal. Rectum normal. Moderate volume stool throughout  the colon similar prior.  Vascular/Lymphatic: Abdominal aorta is normal caliber. There is no retroperitoneal or periportal lymphadenopathy. No pelvic lymphadenopathy. Reproductive: Prostate unremarkable Other: No peritoneal metastasis Musculoskeletal: No aggressive osseous lesion. IMPRESSION: Chest Impression: 1. New focus of nodular consolidation in the LEFT lobe is favored focus of pulmonary infection as lesion is not present on CT exam from 04/25/2021. Recommend short-term follow-up evaluation. 2. Stable to decreased size of large LEFT lower lobe pulmonary nodule. Abdomen / Pelvis Impression: 1. There is inflammation of the ascending colon and proximal transverse colon. Findings suggest segmental colitis with differential including inflammatory bowel disease, infectious colitis, or ischemic colitis. Findings new from comparison exam 2. Again demonstrated moderate volume stool. 3. RIGHT suprarenal nodule slightly decreased in size. 4. No hepatic metastasis. Electronically Signed   By: Suzy Bouchard M.D.   On: 07/22/2021 16:24   VAS [place] ABI WITH/WO TBI  Result Date: 08/05/2021  LOWER EXTREMITY DOPPLER STUDY Patient Name:  Randall Ali  Date of Exam:   08/05/2021 Medical Rec #: [id number]        Accession #:    [id number] Date of Birth: [birth year]        Patient Gender: M Patient Age:   61 years Exam Location:  [facility] Procedure:      VAS [place] ABI WITH/WO TBI Referring Phys: Hortencia Pilar --------------------------------------------------------------------------------  Indications: Peripheral artery disease.  Vascular Interventions: 01/15/2021 PTA and stent of Rt SFA and popliteal artery. Performing Technologist: Concha Norway RVT  Examination Guidelines: A complete evaluation includes at minimum, Doppler waveform signals and systolic blood pressure reading at the level of bilateral brachial, anterior tibial, and posterior tibial arteries, when vessel segments are accessible. Bilateral testing is considered an integral  part of a complete examination. Photoelectric Plethysmograph (PPG) waveforms and toe systolic pressure readings are included as required and additional duplex testing as needed. Limited examinations for reoccurring indications may be performed as noted.  ABI Findings: +---------+------------------+-----+----------+--------+  Right     Rt Pressure (mmHg) Index Waveform   Comment   +---------+------------------+-----+----------+--------+  Brachial  113                                           +---------+------------------+-----+----------+--------+  ATA       122                1.08  monophasic           +---------+------------------+-----+----------+--------+  PTA       128                1.13  triphasic            +---------+------------------+-----+----------+--------+  Great Toe 97                 0.86  Normal               +---------+------------------+-----+----------+--------+ +---------+------------------+-----+---------+-------+  Left      Lt Pressure (mmHg) Index Waveform  Comment  +---------+------------------+-----+---------+-------+  Brachial  111                                         +---------+------------------+-----+---------+-------+  ATA       122  1.08  biphasic           +---------+------------------+-----+---------+-------+  PTA       111                0.98  triphasic          +---------+------------------+-----+---------+-------+  Great Toe 104                0.92  Normal             +---------+------------------+-----+---------+-------+ +-------+-----------+-----------+------------+------------+  ABI/TBI Today's ABI Today's TBI Previous ABI Previous TBI  +-------+-----------+-----------+------------+------------+  Right   1.13        .86         1.10         .87           +-------+-----------+-----------+------------+------------+  Left    1.08        .92         .98          .76           +-------+-----------+-----------+------------+------------+ Bilateral ABIs appear  essentially unchanged compared to prior study on 01/2021.  Summary: Right: Resting right ankle-brachial index is within normal range. No evidence of significant right lower extremity arterial disease. The right toe-brachial index is normal. Left: Resting left ankle-brachial index is within normal range. No evidence of significant left lower extremity arterial disease. The left toe-brachial index is normal.  *See table(s) above for measurements and observations.  Electronically signed by Hortencia Pilar MD on 08/05/2021 at 4:15:14 PM.    Final    VAS [place] LOWER EXTREMITY ARTERIAL DUPLEX  Result Date: 08/05/2021 LOWER EXTREMITY ARTERIAL DUPLEX STUDY Patient Name:  Randall Ali.  Date of Exam:   08/05/2021 Medical Rec #: [id number]            Accession #:    [id number] Date of Birth: 10-29-1940            Patient Gender: M Patient Age:   33 years Exam Location:  [facility] Procedure:      VAS [place] LOWER EXTREMITY ARTERIAL DUPLEX Referring Phys: Hortencia Pilar --------------------------------------------------------------------------------  Indications: Peripheral artery disease.  Vascular Interventions: 01/15/2021 PTA and stent of Rt SFA and popliteal artery. Current ABI:            rt m= 1.10 Performing Technologist: Concha Norway RVT  Examination Guidelines: A complete evaluation includes B-mode imaging, spectral Doppler, color Doppler, and power Doppler as needed of all accessible portions of each vessel. Bilateral testing is considered an integral part of a complete examination. Limited examinations for reoccurring indications may be performed as noted.  +----------+--------+-----+--------+---------+--------+  RIGHT      PSV cm/s Ratio Stenosis Waveform  Comments  +----------+--------+-----+--------+---------+--------+  CFA Mid    187                     triphasic           +----------+--------+-----+--------+---------+--------+  DFA        122                     biphasic             +----------+--------+-----+--------+---------+--------+  SFA Prox   91                      biphasic            +----------+--------+-----+--------+---------+--------+  SFA  Mid    62                      triphasic           +----------+--------+-----+--------+---------+--------+  SFA Distal 51                      triphasic           +----------+--------+-----+--------+---------+--------+  POP Distal 56                      triphasic           +----------+--------+-----+--------+---------+--------+  ATA Distal 34                      biphasic            +----------+--------+-----+--------+---------+--------+  PTA Distal 70                      triphasic           +----------+--------+-----+--------+---------+--------+  Summary: Right: Widely patent vessels throughout Mild atherosclerosis throughout. ATA shows moderate atherosclerosis distally,.  See table(s) above for measurements and observations. Electronically signed by Hortencia Pilar MD on 08/05/2021 at 4:17:10 PM.    Final      Assessment/Plan 1. Atherosclerosis of native artery of both lower extremities with intermittent claudication (HCC) Recommend:  The patient is status post successful angiogram with intervention.  The patient reports that the claudication symptoms and leg pain is essentially gone.   The patient denies lifestyle limiting changes at this point in time.  No further invasive studies, angiography or surgery at this time The patient should continue walking and begin a more formal exercise program.  The patient should continue antiplatelet therapy and aggressive treatment of the lipid abnormalities  Smoking cessation was again discussed  The patient should continue wearing graduated compression socks 10-15 mmHg strength to control the mild edema.  Patient should undergo noninvasive studies as ordered. The patient will follow up with me after the studies.   - VAS [place] ABI WITH/WO TBI; Future  2. Carotid stenosis, asymptomatic,  bilateral Recommend:  Given the patient's asymptomatic subcritical stenosis no further invasive testing or surgery at this time.  Continue antiplatelet therapy as prescribed Continue management of CAD, HTN and Hyperlipidemia Healthy heart diet,  encouraged exercise at least 4 times per week Follow up in 12 months with duplex ultrasound and physical exam    3. Atherosclerosis of native coronary artery of native heart without angina pectoris Continue cardiac and antihypertensive medications as already ordered and reviewed, no changes at this time.  Continue statin as ordered and reviewed, no changes at this time  Nitrates PRN for chest pain   4. Primary hypertension Continue antihypertensive medications as already ordered, these medications have been reviewed and there are no changes at this time.   5. Mixed hyperlipidemia Continue statin as ordered and reviewed, no changes at this time     Hortencia Pilar, MD  08/07/2021 1:56 PM

## 2021-08-08 ENCOUNTER — Encounter: Payer: Self-pay | Admitting: Internal Medicine

## 2021-08-09 DIAGNOSIS — M62830 Muscle spasm of back: Secondary | ICD-10-CM | POA: Diagnosis not present

## 2021-08-09 DIAGNOSIS — M9903 Segmental and somatic dysfunction of lumbar region: Secondary | ICD-10-CM | POA: Diagnosis not present

## 2021-08-09 DIAGNOSIS — M955 Acquired deformity of pelvis: Secondary | ICD-10-CM | POA: Diagnosis not present

## 2021-08-09 DIAGNOSIS — M9905 Segmental and somatic dysfunction of pelvic region: Secondary | ICD-10-CM | POA: Diagnosis not present

## 2021-08-12 ENCOUNTER — Telehealth: Payer: Self-pay | Admitting: *Deleted

## 2021-08-12 NOTE — Telephone Encounter (Signed)
Patient called and left message that he feels terrible and the fluids we are giving him are not helping. He is requesting something be done for him. ?

## 2021-08-13 ENCOUNTER — Other Ambulatory Visit: Payer: Self-pay

## 2021-08-13 ENCOUNTER — Inpatient Hospital Stay: Payer: PPO | Attending: Internal Medicine

## 2021-08-13 ENCOUNTER — Inpatient Hospital Stay (HOSPITAL_BASED_OUTPATIENT_CLINIC_OR_DEPARTMENT_OTHER): Payer: PPO | Admitting: Hospice and Palliative Medicine

## 2021-08-13 ENCOUNTER — Encounter: Payer: Self-pay | Admitting: Hospice and Palliative Medicine

## 2021-08-13 VITALS — BP 129/68 | HR 78 | Temp 97.0°F | Wt 218.4 lb

## 2021-08-13 DIAGNOSIS — Z87891 Personal history of nicotine dependence: Secondary | ICD-10-CM | POA: Insufficient documentation

## 2021-08-13 DIAGNOSIS — Z7902 Long term (current) use of antithrombotics/antiplatelets: Secondary | ICD-10-CM | POA: Insufficient documentation

## 2021-08-13 DIAGNOSIS — R5383 Other fatigue: Secondary | ICD-10-CM | POA: Diagnosis not present

## 2021-08-13 DIAGNOSIS — Z7982 Long term (current) use of aspirin: Secondary | ICD-10-CM | POA: Insufficient documentation

## 2021-08-13 DIAGNOSIS — R109 Unspecified abdominal pain: Secondary | ICD-10-CM | POA: Insufficient documentation

## 2021-08-13 DIAGNOSIS — C3432 Malignant neoplasm of lower lobe, left bronchus or lung: Secondary | ICD-10-CM | POA: Diagnosis not present

## 2021-08-13 DIAGNOSIS — I2510 Atherosclerotic heart disease of native coronary artery without angina pectoris: Secondary | ICD-10-CM | POA: Insufficient documentation

## 2021-08-13 DIAGNOSIS — R630 Anorexia: Secondary | ICD-10-CM | POA: Diagnosis not present

## 2021-08-13 DIAGNOSIS — Z9221 Personal history of antineoplastic chemotherapy: Secondary | ICD-10-CM | POA: Diagnosis not present

## 2021-08-13 DIAGNOSIS — J189 Pneumonia, unspecified organism: Secondary | ICD-10-CM | POA: Insufficient documentation

## 2021-08-13 DIAGNOSIS — Z7951 Long term (current) use of inhaled steroids: Secondary | ICD-10-CM | POA: Diagnosis not present

## 2021-08-13 DIAGNOSIS — Z79899 Other long term (current) drug therapy: Secondary | ICD-10-CM | POA: Insufficient documentation

## 2021-08-13 DIAGNOSIS — R0989 Other specified symptoms and signs involving the circulatory and respiratory systems: Secondary | ICD-10-CM | POA: Insufficient documentation

## 2021-08-13 DIAGNOSIS — Z7952 Long term (current) use of systemic steroids: Secondary | ICD-10-CM | POA: Insufficient documentation

## 2021-08-13 LAB — COMPREHENSIVE METABOLIC PANEL
ALT: 19 U/L (ref 0–44)
AST: 18 U/L (ref 15–41)
Albumin: 3.8 g/dL (ref 3.5–5.0)
Alkaline Phosphatase: 40 U/L (ref 38–126)
Anion gap: 8 (ref 5–15)
BUN: 31 mg/dL — ABNORMAL HIGH (ref 8–23)
CO2: 24 mmol/L (ref 22–32)
Calcium: 8.7 mg/dL — ABNORMAL LOW (ref 8.9–10.3)
Chloride: 102 mmol/L (ref 98–111)
Creatinine, Ser: 1.05 mg/dL (ref 0.61–1.24)
GFR, Estimated: >60 mL/min (ref >60)
Glucose, Bld: 154 mg/dL — ABNORMAL HIGH (ref 70–99)
Potassium: 3.9 mmol/L (ref 3.5–5.1)
Sodium: 134 mmol/L — ABNORMAL LOW (ref 135–145)
Total Bilirubin: 0.8 mg/dL (ref 0.3–1.2)
Total Protein: 6.3 g/dL — ABNORMAL LOW (ref 6.5–8.1)

## 2021-08-13 LAB — CBC WITH DIFFERENTIAL/PLATELET
Abs Immature Granulocytes: 0.3 10*3/uL — ABNORMAL HIGH (ref 0.00–0.07)
Basophils Absolute: 0.1 10*3/uL (ref 0.0–0.1)
Basophils Relative: 0 %
Eosinophils Absolute: 0 10*3/uL (ref 0.0–0.5)
Eosinophils Relative: 0 %
HCT: 43.9 % (ref 39.0–52.0)
Hemoglobin: 14.8 g/dL (ref 13.0–17.0)
Immature Granulocytes: 2 %
Lymphocytes Relative: 4 %
Lymphs Abs: 0.6 10*3/uL — ABNORMAL LOW (ref 0.7–4.0)
MCH: 31.8 pg (ref 26.0–34.0)
MCHC: 33.7 g/dL (ref 30.0–36.0)
MCV: 94.4 fL (ref 80.0–100.0)
Monocytes Absolute: 0.4 10*3/uL (ref 0.1–1.0)
Monocytes Relative: 3 %
Neutro Abs: 12.7 10*3/uL — ABNORMAL HIGH (ref 1.7–7.7)
Neutrophils Relative %: 91 %
Platelets: 175 10*3/uL (ref 150–400)
RBC: 4.65 MIL/uL (ref 4.22–5.81)
RDW: 13.2 % (ref 11.5–15.5)
WBC: 14.1 10*3/uL — ABNORMAL HIGH (ref 4.0–10.5)
nRBC: 0 % (ref 0.0–0.2)

## 2021-08-13 LAB — TSH: TSH: 0.724 u[IU]/mL (ref 0.350–4.500)

## 2021-08-13 MED ORDER — SODIUM CHLORIDE 0.9% FLUSH
10.0000 mL | Freq: Once | INTRAVENOUS | Status: AC
  Administered 2021-08-13: 10 mL via INTRAVENOUS
  Filled 2021-08-13: qty 10

## 2021-08-13 MED ORDER — HEPARIN SOD (PORK) LOCK FLUSH 100 UNIT/ML IV SOLN
500.0000 [IU] | Freq: Once | INTRAVENOUS | Status: AC
  Administered 2021-08-13: 500 [IU] via INTRAVENOUS
  Filled 2021-08-13: qty 5

## 2021-08-13 NOTE — Telephone Encounter (Signed)
Pt has been contacted and scheduled for labs/smc today.  ?

## 2021-08-13 NOTE — Progress Notes (Signed)
Requested visit to discuss chronic fatigue. Pt is able to go to gym 6-7 days a week currently. Has a softball tournament next weekend, concerned that he isn't going to be able to make it. Appetite tremendous. New onset of dizziness which started this morning. BP sitting 129/ 68  standing 139/72. ?

## 2021-08-13 NOTE — Telephone Encounter (Signed)
Noted  

## 2021-08-13 NOTE — Progress Notes (Addendum)
Symptom Management [place] at [facility] [phone]) [phone] [phone]  Patient Care Team: Ria Bush, MD as PCP - General (Family Medicine) [name] [name], MD as PCP - Cardiology (Cardiology) [name], MD (Internal Medicine) Minna Merritts, MD as Consulting Physician (Cardiology) Pieter Partridge, DO as Consulting Physician (Neurology) Cammie Sickle, MD as Consulting Physician (Hematology and Oncology) Telford Nab, RN as Oncology Nurse Navigator   Name of the patient: Randall Ali  [id number]  1940-10-26   Date of visit: 08/13/21  Reason for Consult:  Randall Ali is a 81 year old male with multiple medical problems including CAD status post CABG, PVD status post stenting, stage IV non-small cell lung cancer initially diagnosed May 2022 status post Carbo/Taxol now on maintenance Keytruda.  Patient has had previous and ongoing fatigue from [facility] requiring treatment holiday.  Patient presents to [facility] today for evaluation of fatigue.  He describes persistent fatigue despite being off treatment now for weeks.  He is still quite active with exercising at the gym daily.  However, he says that he just has to force himself to be active.  Patient does endorse some intermittent dizziness today but denies symptoms presently.  He denies headache or visual changes.  He recently came off antibiotics for URI and says that he is pending follow-up with ENT later this week as he has been having significant "sinus pressure."  Denies any neurologic complaints. Denies recent fevers or illnesses. Denies any easy bleeding or bruising. Reports good appetite and denies weight loss. Denies chest pain. Denies any nausea, vomiting, constipation.  Denies urinary complaints. Patient offers no further specific complaints today.  PAST MEDICAL HISTORY: Past Medical History:  Diagnosis Date   Allergy    seasonal   Arthritis    all over-  in general    CAD (coronary artery disease)    a. inferior wall MI 10/01 s/p PCI/DES to RCA; b. Myoview 3/16 neg for ischemia; c. LHC 8/16: ostLAD 80%, OM1 70%, OM2 70% x 2 lesions, mRCA 30%, dRCA 70% s/p 4-V CABG 01/24/15 (LIMA-LAD, VG- OM1, VG-OM2, VG-PDA)    Cancer (HCC)    skin, melanoma   Carotid artery disease ([place])    a. [place] 8/16: 1-39% bilateral ICA stenosis   Cataract    removed   Diastolic dysfunction    a. TTE 8/16: EF 55-60%, no RWMA, Gr1DD, calcified mitral annulus, mild biatrial enlargement   Erectile dysfunction    GERD (gastroesophageal reflux disease)    History of elbow surgery    History of hiatal hernia    HLD (hyperlipidemia)    HTN (hypertension)    Inferior myocardial infarction ([place]) 03/2000   stent RCA   Lung cancer ([place])    Postoperative wound infection 02/02/2015   Reflux esophagitis    Sleep apnea 2017   CPAP at night    PAST SURGICAL HISTORY:  Past Surgical History:  Procedure Laterality Date   arm surgery  2010   BROW LIFT Bilateral 11/25/2019   Procedure: BROW PTOSIS REPAIR BILATERAL;  Surgeon: Karle Starch, MD;  Location: [place];  Service: Ophthalmology;  Laterality: Bilateral;  sleep apnea   CARDIAC CATHETERIZATION  06/24/2011   CARDIAC CATHETERIZATION N/A 01/18/2015   Procedure: Left Heart Cath with coronary angiography;  Surgeon: Minna Merritts, MD;  Location: [place] [facility];  Service: Cardiovascular;  Laterality: N/A;   CARDIAC CATHETERIZATION N/A 01/18/2015   Procedure: Intravascular Pressure Wire/FFR Study;  Surgeon: Mertie Clause  Fletcher Anon, MD;  Location: [place] [facility];  Service: Cardiovascular;  Laterality: N/A;   CAROTID STENT  03/10/2011   COLONOSCOPY  2010   COLONOSCOPY  06/14/2014   Dr Hilarie Fredrickson   CORONARY ARTERY BYPASS GRAFT N/A 01/24/2015   Procedure: CORONARY ARTERY BYPASS GRAFTING x 4 (LIMA-LAD, SVG-Int 1- Int 2, SVG-PD) ENDOSCOPIC GREATER SAPHENOUS VEIN HARVEST LEFT LEG;  Surgeon: [name], MD;   Location: [place];  Service: Open Heart Surgery;  Laterality: N/A;   EMBOLECTOMY  06/15/2019   Procedure: EMBOLECTOMY;  Surgeon: Katha Cabal, MD;  Location: [facility];  Service: Vascular;;  right superficial femoral artery   ENDARTERECTOMY FEMORAL Right 06/15/2019   Procedure: ENDARTERECTOMY FEMORAL;  Surgeon: Katha Cabal, MD;  Location: [facility];  Service: Vascular;  Laterality: Right;  common femoral profunda femoris superficial femoral   ESOPHAGOGASTRODUODENOSCOPY (EGD) WITH PROPOFOL N/A 04/24/2016   Procedure: ESOPHAGOGASTRODUODENOSCOPY (EGD) WITH PROPOFOL;  Surgeon: Jerene Bears, MD;  Location: [facility];  Service: Gastroenterology;  Laterality: N/A;   EYE SURGERY     lasik 15 yrs. ago, cataracts removed - both eyes    HAMMER TOE SURGERY     right toe   INSERTION OF ILIAC STENT Right 06/15/2019   Procedure: INSERTION OF ILIAC STENT ( STENTING OF SFA/POP ARTERY );  Surgeon: Katha Cabal, MD;  Location: [facility];  Service: Vascular;  Laterality: Right;  angioplpasty and stent placement: right superficial femoral right tibiopopliteal trunk bilateral common iliac arteries   IR IMAGING GUIDED PORT INSERTION  11/09/2020   LEFT HEART CATH AND CORONARY ANGIOGRAPHY Left 06/10/2017   Procedure: LEFT HEART CATH AND CORONARY ANGIOGRAPHY;  Surgeon: Minna Merritts, MD;  Location: [place] [facility];  Service: Cardiovascular;  Laterality: Left;   LOWER EXTREMITY ANGIOGRAPHY Left 01/04/2019   Procedure: LOWER EXTREMITY ANGIOGRAPHY;  Surgeon: Katha Cabal, MD;  Location: [place] [facility];  Service: Cardiovascular;  Laterality: Left;   LOWER EXTREMITY ANGIOGRAPHY Right 01/25/2019   Procedure: LOWER EXTREMITY ANGIOGRAPHY;  Surgeon: Katha Cabal, MD;  Location: [place] [facility];  Service: Cardiovascular;  Laterality: Right;   LOWER EXTREMITY ANGIOGRAPHY Right 01/15/2021   LOWER EXTREMITY ANGIOGRAPHY and stent placement to R SFA and popliteal artery Delana Meyer,  Dolores Lory, MD)   NASAL SINUS SURGERY  2008   septpolasty, bilateral turbinate reduction   SHOULDER ARTHROSCOPY  2012   TEE WITHOUT CARDIOVERSION N/A 01/24/2015   Procedure: TRANSESOPHAGEAL ECHOCARDIOGRAM (TEE);  Surgeon: [name], MD;  Location: [place];  Service: Open Heart Surgery;  Laterality: N/A;   TOE SURGERY  1994   UPPER GI ENDOSCOPY  07/2014, 04-24-16   Dr Raquel James   WRIST SURGERY  2011    HEMATOLOGY/ONCOLOGY HISTORY:  Oncology History Overview Note  #MAY 2022-Lung cancer-non-small cell [CT guided bx] T3N1 vs stage IV Dr.Hendrickson.  MRI brain negative for malignancy.# 1. April 2022- LLL ~4.0 cm mass in the superior segment left lower lobe abuts the major fissure and the posterior pleural surface without visible chest wall invasion without pleural effusion. There 2-3 other small nodules in the left lower lobe, largest measures 9 mm in diameter. These are suspicious for same lobe satellite lesions and there is left hilar adenopathy. Assuming non-small cell lung cancer the appearance is compatible with T3 N1 M0 disease (stage IIIA).  # right suprarenal nodule-awaiting biopsy on 5/31- non-small cell [revived at tumor conference at [place]  # June 9th 2022- CARBO-TAXOl-KEYTRUDA; Fulphila  # ? Subtle adrenal insufficiency- No  acute process - -September MRI brain negative for any pituitary hypophysitis/brain metastasis. Prednisone   MOLECULAR TESTING: NGS TPS-PDL 100%; EXON 12 amplification* mutations.    # CAD [CABG 2016; Dr.Gollan]   Cancer of lower lobe of left lung ([place])  11/02/2020 Initial Diagnosis   Cancer of lower lobe of left lung ([place])   11/02/2020 Cancer Staging   Staging form: Lung, AJCC 8th Edition - Clinical: Stage IVA (cT3, cN1, cM1a) - Signed by Cammie Sickle, MD on 11/02/2020   11/16/2020 -  Chemotherapy   Patient is on Treatment Plan : LUNG NSCLC Carboplatin + Paclitaxel + Pembrolizumab q21d x 4 cycles / Pembrolizumab Maintenance Q21D        ALLERGIES:  is allergic to flomax [tamsulosin].  MEDICATIONS:  Current Outpatient Medications  Medication Sig Dispense Refill   acetaminophen (TYLENOL) 325 MG tablet Take 650 mg by mouth as needed for moderate pain or mild pain.     albuterol (VENTOLIN HFA) 108 (90 Base) MCG/ACT inhaler Inhale 2 puffs into the lungs every 6 (six) hours as needed for wheezing or shortness of breath. 8 g 2   Ascorbic Acid (VITAMIN C) 1000 MG tablet Take 1,000 mg by mouth daily.     aspirin 81 MG EC tablet Take 81 mg by mouth daily.       Calcium-Magnesium-Vitamin D (CALCIUM 1200+D3 PO) Take 1 tablet by mouth daily.     Carboxymethylcellul-Glycerin (LUBRICATING EYE DROPS OP) Place 1 drop into both eyes daily as needed (irritation).     Cholecalciferol (VITAMIN D3) 50 MCG (2000 UT) TABS Take 2,000 Units by mouth daily.     Cyanocobalamin (B-12) 5000 MCG CAPS Take 5,000 mcg by mouth daily.     docusate sodium (COLACE) 100 MG capsule Take 200 mg by mouth at bedtime.     doxycycline (VIBRA-TABS) 100 MG tablet Take 100 mg by mouth 2 (two) times daily. (Patient not taking: Reported on 07/25/2021)     fluticasone-salmeterol (ADVAIR HFA) 115-21 MCG/ACT inhaler Inhale 2 puffs into the lungs 2 (two) times daily. 1 each 12   levofloxacin (LEVAQUIN) 500 MG tablet Take 1 tablet (500 mg total) by mouth daily. 10 tablet 0   lidocaine-prilocaine (EMLA) cream Apply 30 -45 mins prior to port access. 30 g 3   lisinopril (ZESTRIL) 5 MG tablet Take 1 tablet (5 mg total) by mouth daily. 90 tablet 3   Melatonin 10 MG CAPS Take 10 mg by mouth at bedtime as needed (sleep).     montelukast (SINGULAIR) 10 MG tablet Take 1 tablet (10 mg total) by mouth at bedtime. (Patient not taking: Reported on 07/25/2021) 30 tablet 1   nitroGLYCERIN (NITROSTAT) 0.4 MG SL tablet Place 1 tablet (0.4 mg total) under the tongue every 5 (five) minutes as needed for up to 25 doses for chest pain. 25 tablet 1   pantoprazole (PROTONIX) 40 MG tablet TAKE 1  TABLET BY MOUTH DAILY 90 tablet 3   predniSONE (DELTASONE) 20 MG tablet Take 3 tablets (60 mg total) by mouth daily with breakfast. 3 pills once a day with breakfast-do not stop until next visit/1 week 60 tablet 0   prochlorperazine (COMPAZINE) 10 MG tablet Take 1 tablet (10 mg total) by mouth every 6 (six) hours as needed for nausea or vomiting. 40 tablet 1   simvastatin (ZOCOR) 40 MG tablet TAKE 1 TABLET BY MOUTH AT BEDTIME 90 tablet 0   traMADol (ULTRAM) 50 MG tablet TAKE 1 TABLET BY MOUTH 3 TIMES DAILY AS NEEDED  FOR MODERATE PAIN 90 tablet 0   triamcinolone ointment (KENALOG) 0.5 %      Vitamin E 450 MG (1000 UT) CAPS Take 450 Units by mouth daily.     Zinc 50 MG TABS Take 50 mg by mouth daily.     No current facility-administered medications for this visit.   Facility-Administered Medications Ordered in Other Visits  Medication Dose Route Frequency Provider Last Rate Last Admin   heparin lock flush 100 UNIT/ML injection             VITAL SIGNS: There were no vitals taken for this visit. There were no vitals filed for this visit.  Estimated body mass index is 32.6 kg/m as calculated from the following:   Height as of 07/15/21: 5\' 8"  (1.727 m).   Weight as of 08/05/21: 214 lb 6.4 oz (97.3 kg).  LABS: CBC:    Component Value Date/Time   WBC 15.1 (H) 08/06/2021 0948   HGB 14.1 08/06/2021 0948   HGB 14.8 01/15/2015 0956   HCT 42.8 08/06/2021 0948   HCT 44.2 01/15/2015 0956   PLT 220 08/06/2021 0948   PLT 207 01/15/2015 0956   MCV 95.3 08/06/2021 0948   MCV 91 01/15/2015 0956   NEUTROABS 12.8 (H) 08/06/2021 0948   NEUTROABS 5.2 01/15/2015 0956   LYMPHSABS 1.3 08/06/2021 0948   LYMPHSABS 1.9 01/15/2015 0956   MONOABS 0.7 08/06/2021 0948   EOSABS 0.0 08/06/2021 0948   EOSABS 0.2 01/15/2015 0956   BASOSABS 0.1 08/06/2021 0948   BASOSABS 0.0 01/15/2015 0956   Comprehensive Metabolic Panel:    Component Value Date/Time   NA 135 08/06/2021 0948   NA 138 01/15/2015 0956   K  3.6 08/06/2021 0948   CL 102 08/06/2021 0948   CO2 27 08/06/2021 0948   BUN 25 (H) 08/06/2021 0948   BUN 16 01/15/2015 0956   CREATININE 1.12 08/06/2021 0948   GLUCOSE 136 (H) 08/06/2021 0948   CALCIUM 8.8 (L) 08/06/2021 0948   AST 17 08/06/2021 0948   ALT 17 08/06/2021 0948   ALKPHOS 38 08/06/2021 0948   BILITOT 0.5 08/06/2021 0948   PROT 6.1 (L) 08/06/2021 0948   ALBUMIN 3.5 08/06/2021 0948    RADIOGRAPHIC STUDIES: CT CHEST ABDOMEN PELVIS W CONTRAST  Result Date: 07/22/2021 CLINICAL DATA:  Non-small cell lung cancer. EXAM: CT CHEST, ABDOMEN, AND PELVIS WITH CONTRAST TECHNIQUE: Multidetector CT imaging of the chest, abdomen and pelvis was performed following the standard protocol during bolus administration of intravenous contrast. RADIATION DOSE REDUCTION: This exam was performed according to the departmental dose-optimization program which includes automated exposure control, adjustment of the mA and/or kV according to patient size and/or use of iterative reconstruction technique. CONTRAST:  121mL OMNIPAQUE IOHEXOL 300 MG/ML  SOLN COMPARISON:  CT 04/25/2021 FINDINGS: CT CHEST FINDINGS Cardiovascular: Port in the anterior chest wall with tip in distal SVC. Post CABG Mediastinum/Nodes: No axillary or supraclavicular adenopathy. No mediastinal or hilar adenopathy. No pericardial fluid. Esophagus normal. Lungs/Pleura: Rounded nodule along the pleural surface of the LEFT lower lobe measures 19 mm by 15 mm compared with 12 x 19 mm on comparison exam. New focus of consolidation in the LEFT lower lobe posterior to fissure measures 1.8 x 1.5 cm (image 106/3). This focus consolidation has a segmental pattern with linear extension towards the periphery of the lung (image 118/4). Centrally the lesion is more nodular appearance (image 121/4) Musculoskeletal: No aggressive osseous lesion. CT ABDOMEN AND PELVIS FINDINGS Hepatobiliary: Small low-density lesions in liver  unchanged from prior most consistent  benign cysts. Normal gallbladder Pancreas: Pancreas is normal. No ductal dilatation. No pancreatic inflammation. Spleen: Normal spleen Adrenals/urinary tract: Adrenal glands normal. Nodule adjacent the pole of the RIGHT kidney measuring 12 mm compared to 15 mm. Stomach/Bowel: Stomach, small bowel, appendix, and cecum are normal. A segment of mucosal thickening and mild pericolonic inflammation centered about the hepatic flexure (image 72/2. This is a relatively long segment which extends the ascending colon to in the mid transverse colon. No perforation or abscess. Descending colon normal. Rectum normal. Moderate volume stool throughout the colon similar prior. Vascular/Lymphatic: Abdominal aorta is normal caliber. There is no retroperitoneal or periportal lymphadenopathy. No pelvic lymphadenopathy. Reproductive: Prostate unremarkable Other: No peritoneal metastasis Musculoskeletal: No aggressive osseous lesion. IMPRESSION: Chest Impression: 1. New focus of nodular consolidation in the LEFT lobe is favored focus of pulmonary infection as lesion is not present on CT exam from 04/25/2021. Recommend short-term follow-up evaluation. 2. Stable to decreased size of large LEFT lower lobe pulmonary nodule. Abdomen / Pelvis Impression: 1. There is inflammation of the ascending colon and proximal transverse colon. Findings suggest segmental colitis with differential including inflammatory bowel disease, infectious colitis, or ischemic colitis. Findings new from comparison exam 2. Again demonstrated moderate volume stool. 3. RIGHT suprarenal nodule slightly decreased in size. 4. No hepatic metastasis. Electronically Signed   By: Suzy Bouchard M.D.   On: 07/22/2021 16:24   VAS [place] ABI WITH/WO TBI  Result Date: 08/05/2021  LOWER EXTREMITY DOPPLER STUDY Patient Name:  TARUN PATCHELL  Date of Exam:   08/05/2021 Medical Rec #: [id number]        Accession #:    [id number] Date of Birth: Sep 16, 1940        Patient Gender: M  Patient Age:   51 years Exam Location:  [facility] Procedure:      VAS [place] ABI WITH/WO TBI Referring Phys: Hortencia Pilar --------------------------------------------------------------------------------  Indications: Peripheral artery disease.  Vascular Interventions: 01/15/2021 PTA and stent of Rt SFA and popliteal artery. Performing Technologist: Concha Norway RVT  Examination Guidelines: A complete evaluation includes at minimum, Doppler waveform signals and systolic blood pressure reading at the level of bilateral brachial, anterior tibial, and posterior tibial arteries, when vessel segments are accessible. Bilateral testing is considered an integral part of a complete examination. Photoelectric Plethysmograph (PPG) waveforms and toe systolic pressure readings are included as required and additional duplex testing as needed. Limited examinations for reoccurring indications may be performed as noted.  ABI Findings: +---------+------------------+-----+----------+--------+  Right     Rt Pressure (mmHg) Index Waveform   Comment   +---------+------------------+-----+----------+--------+  Brachial  113                                           +---------+------------------+-----+----------+--------+  ATA       122                1.08  monophasic           +---------+------------------+-----+----------+--------+  PTA       128                1.13  triphasic            +---------+------------------+-----+----------+--------+  Great Toe 97                 0.86  Normal               +---------+------------------+-----+----------+--------+ +---------+------------------+-----+---------+-------+  Left      Lt Pressure (mmHg) Index Waveform  Comment  +---------+------------------+-----+---------+-------+  Brachial  111                                         +---------+------------------+-----+---------+-------+  ATA       122                1.08  biphasic            +---------+------------------+-----+---------+-------+  PTA       111                0.98  triphasic          +---------+------------------+-----+---------+-------+  Great Toe 104                0.92  Normal             +---------+------------------+-----+---------+-------+ +-------+-----------+-----------+------------+------------+  ABI/TBI Today's ABI Today's TBI Previous ABI Previous TBI  +-------+-----------+-----------+------------+------------+  Right   1.13        .86         1.10         .87           +-------+-----------+-----------+------------+------------+  Left    1.08        .92         .98          .76           +-------+-----------+-----------+------------+------------+ Bilateral ABIs appear essentially unchanged compared to prior study on 01/2021.  Summary: Right: Resting right ankle-brachial index is within normal range. No evidence of significant right lower extremity arterial disease. The right toe-brachial index is normal. Left: Resting left ankle-brachial index is within normal range. No evidence of significant left lower extremity arterial disease. The left toe-brachial index is normal.  *See table(s) above for measurements and observations.  Electronically signed by Hortencia Pilar MD on 08/05/2021 at 4:15:14 PM.    Final    VAS [place] LOWER EXTREMITY ARTERIAL DUPLEX  Result Date: 08/05/2021 LOWER EXTREMITY ARTERIAL DUPLEX STUDY Patient Name:  Kaceton Vieau.  Date of Exam:   08/05/2021 Medical Rec #: [id number]            Accession #:    [id number] Date of Birth: [birth year]            Patient Gender: M Patient Age:   58 years Exam Location:  [facility] Procedure:      VAS [place] LOWER EXTREMITY ARTERIAL DUPLEX Referring Phys: Hortencia Pilar --------------------------------------------------------------------------------  Indications: Peripheral artery disease.  Vascular Interventions: 01/15/2021 PTA and stent of Rt SFA and popliteal artery. Current ABI:            rt m= 1.10  Performing Technologist: Concha Norway RVT  Examination Guidelines: A complete evaluation includes B-mode imaging, spectral Doppler, color Doppler, and power Doppler as needed of all accessible portions of each vessel. Bilateral testing is considered an integral part of a complete examination. Limited examinations for reoccurring indications may be performed as noted.  +----------+--------+-----+--------+---------+--------+  RIGHT      PSV cm/s Ratio Stenosis Waveform  Comments  +----------+--------+-----+--------+---------+--------+  CFA Mid    187                     triphasic           +----------+--------+-----+--------+---------+--------+  DFA        122                     biphasic            +----------+--------+-----+--------+---------+--------+  SFA Prox   91                      biphasic            +----------+--------+-----+--------+---------+--------+  SFA Mid    62                      triphasic           +----------+--------+-----+--------+---------+--------+  SFA Distal 51                      triphasic           +----------+--------+-----+--------+---------+--------+  POP Distal 56                      triphasic           +----------+--------+-----+--------+---------+--------+  ATA Distal 34                      biphasic            +----------+--------+-----+--------+---------+--------+  PTA Distal 70                      triphasic           +----------+--------+-----+--------+---------+--------+  Summary: Right: Widely patent vessels throughout Mild atherosclerosis throughout. ATA shows moderate atherosclerosis distally,.  See table(s) above for measurements and observations. Electronically signed by Hortencia Pilar MD on 08/05/2021 at 4:17:10 PM.    Final     PERFORMANCE STATUS (ECOG) : 1 - Symptomatic but completely ambulatory  Review of Systems Unless otherwise noted, a complete review of systems is negative.  Physical Exam General: NAD HEENT: Ears clear bilaterally, OP without  exudate Cardiovascular: regular rate and rhythm Pulmonary: clear ant fields Abdomen: soft, nontender, + bowel sounds GU: no suprapubic tenderness Extremities: no edema, no joint deformities Skin: no rashes Neurological: Grossly nonfocal, CN II through XII intact  Assessment and Plan- Patient is a 81 y.o. male with multiple medical problems including CAD status post CABG, PVD status post stenting, stage IV non-small cell lung cancer initially diagnosed May 2022 status post Carbo/Taxol now on maintenance Keytruda.  Patient has had previous and ongoing fatigue from [facility] requiring treatment holiday.  Patient presents to [facility] for evaluation of fatigue and diarrhea   Fatigue -fatigue was thought secondary to [facility] with plus or minus adrenal insufficiency.  Patient was previously on steroids but did not have significant improvement in symptoms.  Discussed with patient that this may be his new baseline.  Recommended that he try American ginseng 2000 mg daily for cancer fatigue.  Dizziness -suspect related to the chronic sinusitis versus BPPV.  Patient is pending follow-up with ENT.  Exam benign today.  If symptoms persist or worsen, would likely recommend repeat MRI of the brain.   RTC later this week to see Dr. Rogue Bussing.  Patient expressed understanding and was in agreement with this plan. He also understands that He can call clinic at any time with any questions, concerns, or complaints.   Thank you for allowing me to participate in the care of this very pleasant patient.   Time Total: 15 minutes  Visit consisted of counseling and education dealing with the complex and emotionally intense issues of symptom management in the setting of serious illness.Greater than 50%  of this time was spent counseling and coordinating care related to the above assessment and plan.  Signed by: Altha Harm, PhD, NP-C

## 2021-08-14 ENCOUNTER — Other Ambulatory Visit: Payer: Self-pay

## 2021-08-14 ENCOUNTER — Telehealth: Payer: Self-pay | Admitting: *Deleted

## 2021-08-14 ENCOUNTER — Encounter: Payer: Self-pay | Admitting: *Deleted

## 2021-08-14 DIAGNOSIS — C3432 Malignant neoplasm of lower lobe, left bronchus or lung: Secondary | ICD-10-CM

## 2021-08-14 MED ORDER — ONDANSETRON HCL 8 MG PO TABS: 8.0000 mg | ORAL_TABLET | Freq: Three times a day (TID) | ORAL | 0 refills | Status: AC | PRN

## 2021-08-14 NOTE — Telephone Encounter (Signed)
Patient called stating he has been dry heaving all night and his stomach is hurting from it. He states he needs something done and some IV fluids Please advise ?

## 2021-08-14 NOTE — Telephone Encounter (Signed)
CAll returned to patient and went over doctor recommendations for taking his medications, new medicine sent to pharmacy as well and to push oral fluids, doctor will reassess tomorrow the need for additional IV fluids. Patient in agreement and states he has been doing better the past couple of hours ?

## 2021-08-14 NOTE — Progress Notes (Signed)
Spoke to the patient?s daughter [name] regarding  patients plan of care. And the overall poor tolerance to treatments. Will review again at tomorrow?s visit.

## 2021-08-14 NOTE — Telephone Encounter (Signed)
Discussed with Dr. Rogue Bussing. Please encourage pt to take antiemetics and encourage PO hydration. Pt should have compazine Rx, but we will be sending new Rx for Zofran. Pt to follow-up in clinic tomorrow with labs/md/keytruda as scheduled, and can assess at that time if additional fluids are needed.  ?

## 2021-08-15 ENCOUNTER — Telehealth: Payer: Self-pay | Admitting: *Deleted

## 2021-08-15 ENCOUNTER — Inpatient Hospital Stay: Payer: PPO

## 2021-08-15 ENCOUNTER — Inpatient Hospital Stay (HOSPITAL_BASED_OUTPATIENT_CLINIC_OR_DEPARTMENT_OTHER): Payer: PPO | Admitting: Internal Medicine

## 2021-08-15 ENCOUNTER — Encounter: Payer: Self-pay | Admitting: Internal Medicine

## 2021-08-15 ENCOUNTER — Other Ambulatory Visit: Payer: Self-pay

## 2021-08-15 DIAGNOSIS — C3432 Malignant neoplasm of lower lobe, left bronchus or lung: Secondary | ICD-10-CM

## 2021-08-15 LAB — CBC WITH DIFFERENTIAL/PLATELET
Abs Immature Granulocytes: 0.18 10*3/uL — ABNORMAL HIGH (ref 0.00–0.07)
Basophils Absolute: 0.1 10*3/uL (ref 0.0–0.1)
Basophils Relative: 0 %
Eosinophils Absolute: 0.1 10*3/uL (ref 0.0–0.5)
Eosinophils Relative: 1 %
HCT: 48 % (ref 39.0–52.0)
Hemoglobin: 16.2 g/dL (ref 13.0–17.0)
Immature Granulocytes: 2 %
Lymphocytes Relative: 10 %
Lymphs Abs: 1.1 10*3/uL (ref 0.7–4.0)
MCH: 31.7 pg (ref 26.0–34.0)
MCHC: 33.8 g/dL (ref 30.0–36.0)
MCV: 93.9 fL (ref 80.0–100.0)
Monocytes Absolute: 1.3 10*3/uL — ABNORMAL HIGH (ref 0.1–1.0)
Monocytes Relative: 11 %
Neutro Abs: 8.8 10*3/uL — ABNORMAL HIGH (ref 1.7–7.7)
Neutrophils Relative %: 76 %
Platelets: 171 10*3/uL (ref 150–400)
RBC: 5.11 MIL/uL (ref 4.22–5.81)
RDW: 13.2 % (ref 11.5–15.5)
WBC: 11.5 10*3/uL — ABNORMAL HIGH (ref 4.0–10.5)
nRBC: 0 % (ref 0.0–0.2)

## 2021-08-15 LAB — COMPREHENSIVE METABOLIC PANEL
ALT: 18 U/L (ref 0–44)
AST: 15 U/L (ref 15–41)
Albumin: 3.6 g/dL (ref 3.5–5.0)
Alkaline Phosphatase: 38 U/L (ref 38–126)
Anion gap: 9 (ref 5–15)
BUN: 24 mg/dL — ABNORMAL HIGH (ref 8–23)
CO2: 23 mmol/L (ref 22–32)
Calcium: 8.8 mg/dL — ABNORMAL LOW (ref 8.9–10.3)
Chloride: 99 mmol/L (ref 98–111)
Creatinine, Ser: 1.14 mg/dL (ref 0.61–1.24)
GFR, Estimated: >60 mL/min (ref >60)
Glucose, Bld: 108 mg/dL — ABNORMAL HIGH (ref 70–99)
Potassium: 3.8 mmol/L (ref 3.5–5.1)
Sodium: 131 mmol/L — ABNORMAL LOW (ref 135–145)
Total Bilirubin: 0.8 mg/dL (ref 0.3–1.2)
Total Protein: 6.4 g/dL — ABNORMAL LOW (ref 6.5–8.1)

## 2021-08-15 MED ORDER — HEPARIN SOD (PORK) LOCK FLUSH 100 UNIT/ML IV SOLN
500.0000 [IU] | Freq: Once | INTRAVENOUS | Status: AC | PRN
  Administered 2021-08-15: 12:00:00 500 [IU]
  Filled 2021-08-15: qty 5

## 2021-08-15 MED ORDER — SODIUM CHLORIDE 0.9 % IV SOLN
Freq: Once | INTRAVENOUS | Status: AC
  Filled 2021-08-15: qty 250

## 2021-08-15 MED ORDER — DEXAMETHASONE SODIUM PHOSPHATE 10 MG/ML IJ SOLN
4.0000 mg | Freq: Once | INTRAMUSCULAR | Status: AC
  Administered 2021-08-15: 11:00:00 4 mg via INTRAVENOUS
  Filled 2021-08-15: qty 1

## 2021-08-15 NOTE — Telephone Encounter (Signed)
-----   Message from Secundino Ginger sent at 08/14/2021 11:46 AM EST ----- ?Regarding: PRIOR AUTHORIZATION- [facility] ?[place] sent to chart. ? ?

## 2021-08-15 NOTE — Telephone Encounter (Signed)
I contacted the [facility]. Made pharmacist aware that script was approved. ?

## 2021-08-15 NOTE — Telephone Encounter (Signed)
This request has received a Favorable outcome. ? ?Please note any additional information provided by [employer] at the bottom of this request. ?

## 2021-08-15 NOTE — Progress Notes (Signed)
[facility] NOTE  Patient Care Team: Ria Bush, MD as PCP - General (Family Medicine) [name] [name], MD as PCP - Cardiology (Cardiology) [name], MD (Internal Medicine) Minna Merritts, MD as Consulting Physician (Cardiology) Pieter Partridge, DO as Consulting Physician (Neurology) Cammie Sickle, MD as Consulting Physician (Hematology and Oncology) Telford Nab, RN as Oncology Nurse Navigator  CHIEF COMPLAINTS/PURPOSE OF CONSULTATION: lung cancer    Oncology History Overview Note  #MAY 2022-Lung cancer-non-small cell [CT guided bx] T3N1 vs stage IV Dr.Hendrickson.  MRI brain negative for malignancy.# 1. April 2022- LLL ~4.0 cm mass in the superior segment left lower lobe abuts the major fissure and the posterior pleural surface without visible chest wall invasion without pleural effusion. There 2-3 other small nodules in the left lower lobe, largest measures 9 mm in diameter. These are suspicious for same lobe satellite lesions and there is left hilar adenopathy. Assuming non-small cell lung cancer the appearance is compatible with T3 N1 M0 disease (stage IIIA).  # right suprarenal nodule-awaiting biopsy on 5/31- non-small cell [revived at tumor conference at [facility]  # June 9th 2022- CARBO-TAXOl-KEYTRUDA; Fulphila  # ? Subtle adrenal insufficiency- No acute process - -September MRI brain negative for any pituitary hypophysitis/brain metastasis. Prednisone   MOLECULAR TESTING: NGS TPS-PDL 100%; EXON 12 amplification* mutations.    # CAD [CABG 2016; Dr.Gollan]   Cancer of lower lobe of left lung ([place])  11/02/2020 Initial Diagnosis   Cancer of lower lobe of left lung ([place])   11/02/2020 Cancer Staging   Staging form: Lung, AJCC 8th Edition - Clinical: Stage IVA (cT3, cN1, cM1a) - Signed by Cammie Sickle, MD on 11/02/2020    11/16/2020 -  Chemotherapy   Patient is on Treatment Plan : LUNG NSCLC Carboplatin + Paclitaxel +  Pembrolizumab q21d x 4 cycles / Pembrolizumab Maintenance Q21D        HISTORY OF PRESENTING ILLNESS: Accompanied by his daughter. walking independently.  [name] 81 y.o.  male lung cancer-non-small cell stage IV [supra-renal nodule s/p biopsy] currently on Keytruda is for follow-up.  Patient continues to feel poorly.  Complains of dry heaves.  Needed multiple visits to the symptom management clinic for fatigue/IV fluids dexamethasone.  Patient has multiple complaints abdominal pain; dry heaves; lack of appetite.  However today his abdominal pain dry heaves is improved.  Continues to have poor appetite.  Complains of sinus congestion. No diarrhea. No constipation. Joint pains- still don't have energy. Patient continues to make prednisone 20 mg a day.     Review of Systems  Constitutional:  Positive for malaise/fatigue. Negative for chills, diaphoresis, fever and weight loss.  HENT:  Negative for nosebleeds and sore throat.   Eyes:  Negative for double vision.  Respiratory:  Negative for cough, hemoptysis, sputum production, shortness of breath and wheezing.   Cardiovascular:  Negative for chest pain, palpitations, orthopnea and leg swelling.  Gastrointestinal:  Positive for nausea. Negative for abdominal pain, blood in stool, constipation, diarrhea, heartburn, melena and vomiting.  Genitourinary:  Negative for dysuria, frequency and urgency.  Musculoskeletal:  Positive for back pain and joint pain.  Skin: Negative.  Negative for itching and rash.  Neurological:  Negative for dizziness, tingling, focal weakness, weakness and headaches.  Endo/Heme/Allergies:  Does not bruise/bleed easily.  Psychiatric/Behavioral:  Negative for depression. The patient is not nervous/anxious and does not have insomnia.     MEDICAL HISTORY:  Past Medical History:  Diagnosis Date  Allergy    seasonal   Arthritis    all over- in general    CAD (coronary artery disease)    a. inferior wall MI  10/01 s/p PCI/DES to RCA; b. Myoview 3/16 neg for ischemia; c. LHC 8/16: ostLAD 80%, OM1 70%, OM2 70% x 2 lesions, mRCA 30%, dRCA 70% s/p 4-V CABG 01/24/15 (LIMA-LAD, VG- OM1, VG-OM2, VG-PDA)    Cancer (HCC)    skin, melanoma   Carotid artery disease ([place])    a. [place] 8/16: 1-39% bilateral ICA stenosis   Cataract    removed   Diastolic dysfunction    a. TTE 8/16: EF 55-60%, no RWMA, Gr1DD, calcified mitral annulus, mild biatrial enlargement   Erectile dysfunction    GERD (gastroesophageal reflux disease)    History of elbow surgery    History of hiatal hernia    HLD (hyperlipidemia)    HTN (hypertension)    Inferior myocardial infarction ([place]) 03/2000   stent RCA   Lung cancer ([place])    Postoperative wound infection 02/02/2015   Reflux esophagitis    Sleep apnea 2017   CPAP at night    SURGICAL HISTORY: Past Surgical History:  Procedure Laterality Date   arm surgery  2010   BROW LIFT Bilateral 11/25/2019   Procedure: BROW PTOSIS REPAIR BILATERAL;  Surgeon: Karle Starch, MD;  Location: [facility];  Service: Ophthalmology;  Laterality: Bilateral;  sleep apnea   CARDIAC CATHETERIZATION  06/24/2011   CARDIAC CATHETERIZATION N/A 01/18/2015   Procedure: Left Heart Cath with coronary angiography;  Surgeon: Minna Merritts, MD;  Location: [facility];  Service: Cardiovascular;  Laterality: N/A;   CARDIAC CATHETERIZATION N/A 01/18/2015   Procedure: Intravascular Pressure Wire/FFR Study;  Surgeon: [name], MD;  Location: [facility];  Service: Cardiovascular;  Laterality: N/A;   CAROTID STENT  03/10/2011   COLONOSCOPY  2010   COLONOSCOPY  06/14/2014   Dr Hilarie Fredrickson   CORONARY ARTERY BYPASS GRAFT N/A 01/24/2015   Procedure: CORONARY ARTERY BYPASS GRAFTING x 4 (LIMA-LAD, SVG-Int 1- Int 2, SVG-PD) ENDOSCOPIC GREATER SAPHENOUS VEIN HARVEST LEFT LEG;  Surgeon: [name], MD;  Location: [facility];  Service: Open Heart Surgery;  Laterality: N/A;   EMBOLECTOMY   06/15/2019   Procedure: EMBOLECTOMY;  Surgeon: Katha Cabal, MD;  Location: [facility];  Service: Vascular;;  right superficial femoral artery   ENDARTERECTOMY FEMORAL Right 06/15/2019   Procedure: ENDARTERECTOMY FEMORAL;  Surgeon: Katha Cabal, MD;  Location: [facility];  Service: Vascular;  Laterality: Right;  common femoral profunda femoris superficial femoral   ESOPHAGOGASTRODUODENOSCOPY (EGD) WITH PROPOFOL N/A 04/24/2016   Procedure: ESOPHAGOGASTRODUODENOSCOPY (EGD) WITH PROPOFOL;  Surgeon: Jerene Bears, MD;  Location: [facility];  Service: Gastroenterology;  Laterality: N/A;   EYE SURGERY     lasik 15 yrs. ago, cataracts removed - both eyes    HAMMER TOE SURGERY     right toe   INSERTION OF ILIAC STENT Right 06/15/2019   Procedure: INSERTION OF ILIAC STENT ( STENTING OF SFA/POP ARTERY );  Surgeon: Katha Cabal, MD;  Location: [facility];  Service: Vascular;  Laterality: Right;  angioplpasty and stent placement: right superficial femoral right tibiopopliteal trunk bilateral common iliac arteries   IR IMAGING GUIDED PORT INSERTION  11/09/2020   LEFT HEART CATH AND CORONARY ANGIOGRAPHY Left 06/10/2017   Procedure: LEFT HEART CATH AND CORONARY ANGIOGRAPHY;  Surgeon: Minna Merritts, MD;  Location: [facility];  Service: Cardiovascular;  Laterality: Left;   LOWER EXTREMITY ANGIOGRAPHY Left 01/04/2019   Procedure: LOWER EXTREMITY ANGIOGRAPHY;  Surgeon: Katha Cabal, MD;  Location: [facility];  Service: Cardiovascular;  Laterality: Left;   LOWER EXTREMITY ANGIOGRAPHY Right 01/25/2019   Procedure: LOWER EXTREMITY ANGIOGRAPHY;  Surgeon: Katha Cabal, MD;  Location: [facility];  Service: Cardiovascular;  Laterality: Right;   LOWER EXTREMITY ANGIOGRAPHY Right 01/15/2021   LOWER EXTREMITY ANGIOGRAPHY and stent placement to R SFA and popliteal artery Delana Meyer, Dolores Lory, MD)   NASAL SINUS SURGERY  2008   septpolasty, bilateral turbinate  reduction   SHOULDER ARTHROSCOPY  2012   TEE WITHOUT CARDIOVERSION N/A 01/24/2015   Procedure: TRANSESOPHAGEAL ECHOCARDIOGRAM (TEE);  Surgeon: [name], MD;  Location: [facility];  Service: Open Heart Surgery;  Laterality: N/A;   TOE SURGERY  1994   UPPER GI ENDOSCOPY  07/2014, 04-24-16   Dr Raquel James   WRIST SURGERY  2011    SOCIAL HISTORY: Social History   Socioeconomic History   Marital status: Single    Spouse name: Not on file   Number of children: Not on file   Years of education: 12   Highest education level: Not on file  Occupational History   Occupation: retired    Comment: [employer]  Tobacco Use   Smoking status: Former    Packs/day: 2.50    Years: 40.00    Pack years: 100.00    Types: Cigarettes    Quit date: 03/24/2000    Years since quitting: 21.4   Smokeless tobacco: Never  Vaping Use   Vaping Use: Never used  Substance and Sexual Activity   Alcohol use: Yes    Comment: occassionally   Drug use: No   Sexual activity: Yes  Other Topics Concern   Not on file  Social History Narrative   Singled; lives with son and dog    Occ: retired, back part time at [employer];    Activity: gym 4-5d/wk   Diet: good water, fruits/vegetables daily   Caffeine Use-yes      ------------------------------------       Car sales- retd; [employer]- retd; quit smoking 2001. Alcohol couple nights a week. Live in [place]. Daughter lives 10 mins.    Social Determinants of Health   Financial Resource Strain: Not on file  Food Insecurity: Not on file  Transportation Needs: Not on file  Physical Activity: Not on file  Stress: Not on file  Social Connections: Not on file  Intimate Partner Violence: Not on file    FAMILY HISTORY: Family History  Problem Relation Age of Onset   Hypertension Mother    Heart disease Mother    Hypertension Father    Diabetes Father    Lymphoma Sister    Heart disease Brother 6   Cancer Paternal Grandfather    Colon cancer Neg Hx     Prostate cancer Neg Hx    Bladder Cancer Neg Hx    Kidney cancer Neg Hx     ALLERGIES:  is allergic to flomax [tamsulosin].  MEDICATIONS:  Current Outpatient Medications  Medication Sig Dispense Refill   acetaminophen (TYLENOL) 325 MG tablet Take 650 mg by mouth as needed for moderate pain or mild pain.     albuterol (VENTOLIN HFA) 108 (90 Base) MCG/ACT inhaler Inhale 2 puffs into the lungs every 6 (six) hours as needed for wheezing or shortness of breath. 8 g 2   Ascorbic Acid (VITAMIN C) 1000 MG tablet  Take 1,000 mg by mouth daily.     aspirin 81 MG EC tablet Take 81 mg by mouth daily.       Calcium-Magnesium-Vitamin D (CALCIUM 1200+D3 PO) Take 1 tablet by mouth daily.     Carboxymethylcellul-Glycerin (LUBRICATING EYE DROPS OP) Place 1 drop into both eyes daily as needed (irritation).     Cholecalciferol (VITAMIN D3) 50 MCG (2000 UT) TABS Take 2,000 Units by mouth daily.     Cyanocobalamin (B-12) 5000 MCG CAPS Take 5,000 mcg by mouth daily.     docusate sodium (COLACE) 100 MG capsule Take 200 mg by mouth at bedtime.     fluticasone-salmeterol (ADVAIR HFA) 115-21 MCG/ACT inhaler Inhale 2 puffs into the lungs 2 (two) times daily. 1 each 12   lidocaine-prilocaine (EMLA) cream Apply 30 -45 mins prior to port access. 30 g 3   lisinopril (ZESTRIL) 5 MG tablet Take 1 tablet (5 mg total) by mouth daily. 90 tablet 3   Melatonin 10 MG CAPS Take 10 mg by mouth at bedtime as needed (sleep).     nitroGLYCERIN (NITROSTAT) 0.4 MG SL tablet Place 1 tablet (0.4 mg total) under the tongue every 5 (five) minutes as needed for up to 25 doses for chest pain. 25 tablet 1   ondansetron (ZOFRAN) 8 MG tablet Take 1 tablet (8 mg total) by mouth every 8 (eight) hours as needed for nausea or vomiting. 20 tablet 0   pantoprazole (PROTONIX) 40 MG tablet TAKE 1 TABLET BY MOUTH DAILY 90 tablet 3   predniSONE (DELTASONE) 20 MG tablet Take 3 tablets (60 mg total) by mouth daily with breakfast. 3 pills once a day with  breakfast-do not stop until next visit/1 week 60 tablet 0   prochlorperazine (COMPAZINE) 10 MG tablet Take 1 tablet (10 mg total) by mouth every 6 (six) hours as needed for nausea or vomiting. 40 tablet 1   simvastatin (ZOCOR) 40 MG tablet TAKE 1 TABLET BY MOUTH AT BEDTIME 90 tablet 0   traMADol (ULTRAM) 50 MG tablet TAKE 1 TABLET BY MOUTH 3 TIMES DAILY AS NEEDED FOR MODERATE PAIN 90 tablet 0   triamcinolone ointment (KENALOG) 0.5 %      Vitamin E 450 MG (1000 UT) CAPS Take 450 Units by mouth daily.     Zinc 50 MG TABS Take 50 mg by mouth daily.     No current facility-administered medications for this visit.   Facility-Administered Medications Ordered in Other Visits  Medication Dose Route Frequency Provider Last Rate Last Admin   heparin lock flush 100 UNIT/ML injection               .  PHYSICAL EXAMINATION: ECOG PERFORMANCE STATUS: 0 - Asymptomatic  Vitals:   08/15/21 0936  BP: 137/65  Pulse: 74  Temp: 98.7 F (37.1 C)  SpO2: 97%      Filed Weights   08/15/21 0936  Weight: 211 lb 3.2 oz (95.8 kg)        Physical Exam HENT:     Head: Normocephalic and atraumatic.     Mouth/Throat:     Pharynx: No oropharyngeal exudate.  Eyes:     Pupils: Pupils are equal, round, and reactive to light.  Cardiovascular:     Rate and Rhythm: Normal rate and regular rhythm.  Pulmonary:     Effort: No respiratory distress.     Breath sounds: No wheezing.     Comments: Decreased air entry bilaterally. Abdominal:     General: Bowel sounds are  normal. There is no distension.     Palpations: Abdomen is soft. There is no mass.     Tenderness: There is no abdominal tenderness. There is no guarding or rebound.  Musculoskeletal:        General: No tenderness. Normal range of motion.     Cervical back: Normal range of motion and neck supple.  Skin:    General: Skin is warm.  Neurological:     Mental Status: He is alert and oriented to person, place, and time.  Psychiatric:         Mood and Affect: Affect normal.     LABORATORY DATA:  I have reviewed the data as listed Lab Results  Component Value Date   WBC 11.5 (H) 08/15/2021   HGB 16.2 08/15/2021   HCT 48.0 08/15/2021   MCV 93.9 08/15/2021   PLT 171 08/15/2021   Recent Labs    08/06/21 0948 08/13/21 1410 08/15/21 0907  NA 135 134* 131*  K 3.6 3.9 3.8  CL 102 102 99  CO2 27 24 23   GLUCOSE 136* 154* 108*  BUN 25* 31* 24*  CREATININE 1.12 1.05 1.14  CALCIUM 8.8* 8.7* 8.8*  GFRNONAA >60 >60 >60  PROT 6.1* 6.3* 6.4*  ALBUMIN 3.5 3.8 3.6  AST 17 18 15   ALT 17 19 18   ALKPHOS 38 40 38  BILITOT 0.5 0.8 0.8    RADIOGRAPHIC STUDIES: I have personally reviewed the radiological images as listed and agreed with the findings in the report. CT CHEST ABDOMEN PELVIS W CONTRAST  Result Date: 07/22/2021 CLINICAL DATA:  Non-small cell lung cancer. EXAM: CT CHEST, ABDOMEN, AND PELVIS WITH CONTRAST TECHNIQUE: Multidetector CT imaging of the chest, abdomen and pelvis was performed following the standard protocol during bolus administration of intravenous contrast. RADIATION DOSE REDUCTION: This exam was performed according to the departmental dose-optimization program which includes automated exposure control, adjustment of the mA and/or kV according to patient size and/or use of iterative reconstruction technique. CONTRAST:  149mL OMNIPAQUE IOHEXOL 300 MG/ML  SOLN COMPARISON:  CT 04/25/2021 FINDINGS: CT CHEST FINDINGS Cardiovascular: Port in the anterior chest wall with tip in distal SVC. Post CABG Mediastinum/Nodes: No axillary or supraclavicular adenopathy. No mediastinal or hilar adenopathy. No pericardial fluid. Esophagus normal. Lungs/Pleura: Rounded nodule along the pleural surface of the LEFT lower lobe measures 19 mm by 15 mm compared with 12 x 19 mm on comparison exam. New focus of consolidation in the LEFT lower lobe posterior to fissure measures 1.8 x 1.5 cm (image 106/3). This focus consolidation has a segmental  pattern with linear extension towards the periphery of the lung (image 118/4). Centrally the lesion is more nodular appearance (image 121/4) Musculoskeletal: No aggressive osseous lesion. CT ABDOMEN AND PELVIS FINDINGS Hepatobiliary: Small low-density lesions in liver unchanged from prior most consistent benign cysts. Normal gallbladder Pancreas: Pancreas is normal. No ductal dilatation. No pancreatic inflammation. Spleen: Normal spleen Adrenals/urinary tract: Adrenal glands normal. Nodule adjacent the pole of the RIGHT kidney measuring 12 mm compared to 15 mm. Stomach/Bowel: Stomach, small bowel, appendix, and cecum are normal. A segment of mucosal thickening and mild pericolonic inflammation centered about the hepatic flexure (image 72/2. This is a relatively long segment which extends the ascending colon to in the mid transverse colon. No perforation or abscess. Descending colon normal. Rectum normal. Moderate volume stool throughout the colon similar prior. Vascular/Lymphatic: Abdominal aorta is normal caliber. There is no retroperitoneal or periportal lymphadenopathy. No pelvic lymphadenopathy. Reproductive: Prostate unremarkable Other: No  peritoneal metastasis Musculoskeletal: No aggressive osseous lesion. IMPRESSION: Chest Impression: 1. New focus of nodular consolidation in the LEFT lobe is favored focus of pulmonary infection as lesion is not present on CT exam from 04/25/2021. Recommend short-term follow-up evaluation. 2. Stable to decreased size of large LEFT lower lobe pulmonary nodule. Abdomen / Pelvis Impression: 1. There is inflammation of the ascending colon and proximal transverse colon. Findings suggest segmental colitis with differential including inflammatory bowel disease, infectious colitis, or ischemic colitis. Findings new from comparison exam 2. Again demonstrated moderate volume stool. 3. RIGHT suprarenal nodule slightly decreased in size. 4. No hepatic metastasis. Electronically Signed    By: Suzy Bouchard M.D.   On: 07/22/2021 16:24   VAS [place] ABI WITH/WO TBI  Result Date: 08/05/2021  LOWER EXTREMITY DOPPLER STUDY Patient Name:  SHYHIEM BEENEY  Date of Exam:   08/05/2021 Medical Rec #: [id number]        Accession #:    [id number] Date of Birth: [birth year]        Patient Gender: M Patient Age:   64 years Exam Location:  [facility] Procedure:      VAS [place] ABI WITH/WO TBI Referring Phys: Hortencia Pilar --------------------------------------------------------------------------------  Indications: Peripheral artery disease.  Vascular Interventions: 01/15/2021 PTA and stent of Rt SFA and popliteal artery. Performing Technologist: Concha Norway RVT  Examination Guidelines: A complete evaluation includes at minimum, Doppler waveform signals and systolic blood pressure reading at the level of bilateral brachial, anterior tibial, and posterior tibial arteries, when vessel segments are accessible. Bilateral testing is considered an integral part of a complete examination. Photoelectric Plethysmograph (PPG) waveforms and toe systolic pressure readings are included as required and additional duplex testing as needed. Limited examinations for reoccurring indications may be performed as noted.  ABI Findings: +---------+------------------+-----+----------+--------+  Right     Rt Pressure (mmHg) Index Waveform   Comment   +---------+------------------+-----+----------+--------+  Brachial  113                                           +---------+------------------+-----+----------+--------+  ATA       122                1.08  monophasic           +---------+------------------+-----+----------+--------+  PTA       128                1.13  triphasic            +---------+------------------+-----+----------+--------+  Great Toe 97                 0.86  Normal               +---------+------------------+-----+----------+--------+ +---------+------------------+-----+---------+-------+  Left      Lt Pressure  (mmHg) Index Waveform  Comment  +---------+------------------+-----+---------+-------+  Brachial  111                                         +---------+------------------+-----+---------+-------+  ATA       122                1.08  biphasic           +---------+------------------+-----+---------+-------+  PTA       111  0.98  triphasic          +---------+------------------+-----+---------+-------+  Great Toe 104                0.92  Normal             +---------+------------------+-----+---------+-------+ +-------+-----------+-----------+------------+------------+  ABI/TBI Today's ABI Today's TBI Previous ABI Previous TBI  +-------+-----------+-----------+------------+------------+  Right   1.13        .86         1.10         .87           +-------+-----------+-----------+------------+------------+  Left    1.08        .92         .98          .76           +-------+-----------+-----------+------------+------------+ Bilateral ABIs appear essentially unchanged compared to prior study on 01/2021.  Summary: Right: Resting right ankle-brachial index is within normal range. No evidence of significant right lower extremity arterial disease. The right toe-brachial index is normal. Left: Resting left ankle-brachial index is within normal range. No evidence of significant left lower extremity arterial disease. The left toe-brachial index is normal.  *See table(s) above for measurements and observations.  Electronically signed by Hortencia Pilar MD on 08/05/2021 at 4:15:14 PM.    Final    VAS [place] LOWER EXTREMITY ARTERIAL DUPLEX  Result Date: 08/05/2021 LOWER EXTREMITY ARTERIAL DUPLEX STUDY Patient Name:  Brayn Eckstein.  Date of Exam:   08/05/2021 Medical Rec #: [id number]            Accession #:    [id number] Date of Birth: [birth year]            Patient Gender: M Patient Age:   89 years Exam Location:  [facility] Procedure:      VAS [place] LOWER EXTREMITY ARTERIAL DUPLEX Referring Phys: Hortencia Pilar --------------------------------------------------------------------------------  Indications: Peripheral artery disease.  Vascular Interventions: 01/15/2021 PTA and stent of Rt SFA and popliteal artery. Current ABI:            rt m= 1.10 Performing Technologist: Concha Norway RVT  Examination Guidelines: A complete evaluation includes B-mode imaging, spectral Doppler, color Doppler, and power Doppler as needed of all accessible portions of each vessel. Bilateral testing is considered an integral part of a complete examination. Limited examinations for reoccurring indications may be performed as noted.  +----------+--------+-----+--------+---------+--------+  RIGHT      PSV cm/s Ratio Stenosis Waveform  Comments  +----------+--------+-----+--------+---------+--------+  CFA Mid    187                     triphasic           +----------+--------+-----+--------+---------+--------+  DFA        122                     biphasic            +----------+--------+-----+--------+---------+--------+  SFA Prox   91                      biphasic            +----------+--------+-----+--------+---------+--------+  SFA Mid    62                      triphasic           +----------+--------+-----+--------+---------+--------+  SFA Distal 51                      triphasic           +----------+--------+-----+--------+---------+--------+  POP Distal 56                      triphasic           +----------+--------+-----+--------+---------+--------+  ATA Distal 34                      biphasic            +----------+--------+-----+--------+---------+--------+  PTA Distal 70                      triphasic           +----------+--------+-----+--------+---------+--------+  Summary: Right: Widely patent vessels throughout Mild atherosclerosis throughout. ATA shows moderate atherosclerosis distally,.  See table(s) above for measurements and observations. Electronically signed by Hortencia Pilar MD on 08/05/2021 at 4:17:10 PM.    Final       ASSESSMENT & PLAN:   Cancer of lower lobe of left lung ([place]) # Lung cancer-non-small cell stage IV [right suprarenal nodule-s/p biopsy "non-small cell ca"].  S/p  CarboTaxol plus Keytruda x4; currently on single agent Keytruda-  CT FEB 13th, 2023- Interval decrease in size of the posterior left lower lobe pulmonary lesion;  Interval decrease in size of the right suprarenal nodule; however focal left lower lobe new nodule noted [see below].    # Given the overall stability of the lung cancer; I think is reasonable to continue holding it would at this time [given the poor tolerance see below]; we will again reassess at next visit regarding starting [place].  I had a long discussion with the patient and his daughter in detail.   #/sinuses pressure/ Congestion/ dyspnea/ cough- dry- ? pneuamonia- LLL- CT FEB 13th, 2023- New focus of nodular consolidation in the LEFT lobe is status post antibiotics.  Recommend compliance with Advair; albuterol prn. - on anti-histamine- awaiting evaluation with dr.McQueen.   # Fatigue: Likely secondary to immunotherapy no endocrine dysfunction noted.  Hold therapy for now.  # ? Subtle adrenal insufficiency-[ No acute process - -September MRI brain negative for any pituitary hypophysitis/brain metastasis]. [Continue with long-term prednisone ] - see above.   # CAD-s/p CABG-/PVD-s/p stent [Dr.Schneir]-improvement in pain noted.  On Plavix --STABLE;  # IV access: Mediport functioning.  #Overall prognosis: Discussed with the patient and daughter that given patient's poor tolerance to therapy-we would recommend holding treatments unless patient is improved.  However will need to monitor closely of his lung cancer given his aggressive disease.  We will again repeat imaging in few months.  # DISPOSITION: # HOLD Keytruda today;   IVFs 1lit; ONLY Dex 4 mg IVP today   # 1 week-labs- cbc/bmp possible   IVfs 1lit/dex  # Follow up in 3 weeks- MD; labs- cbc/cmp;TSH-  possible Keytruda;- Dr.B  # I reviewed the blood work- with the patient in detail; also reviewed the imaging independently [as summarized above]; and with the patient in detail.      All  questions were answered. The patient knows to call the clinic with any problems, questions or concerns.    Cammie Sickle, MD 08/15/2021 1:01 PM

## 2021-08-15 NOTE — Assessment & Plan Note (Addendum)
#   Lung cancer-non-small cell stage IV [right suprarenal nodule-s/p biopsy "non-small cell ca"].  S/p  CarboTaxol plus Keytruda x4; currently on single agent Keytruda-  CT FEB 13th, 2023- Interval decrease in size of the posterior left lower lobe pulmonary lesion;  Interval decrease in size of the right suprarenal nodule; however focal left lower lobe new nodule noted [see below].?  ? ?# Given the overall stability of the lung cancer; I think is reasonable to continue holding it would at this time [given the poor tolerance see below]; we will again reassess at next visit regarding starting [place].  I had a long discussion with the patient and his daughter in detail.  ? ?#/sinuses pressure/ Congestion/ dyspnea/ cough- dry- ? pneuamonia- LLL- CT FEB 13th, 2023- New focus of nodular consolidation in the LEFT lobe is status post antibiotics.  Recommend compliance with Advair; albuterol prn. - on anti-histamine- awaiting evaluation with dr.McQueen.  ? ?# Fatigue: Likely secondary to immunotherapy no endocrine dysfunction noted.  Hold therapy for now. ? ?# ? Subtle adrenal insufficiency-[ No acute process - -September MRI brain negative for any pituitary hypophysitis/brain metastasis]. [Continue with long-term prednisone ] - see above.  ? ?# CAD-s/p CABG-/PVD-s/p stent [Dr.Schneir]-improvement in pain noted.  On Plavix --STABLE; ? ?# IV access: Mediport functioning. ? ?#Overall prognosis: Discussed with the patient and daughter that given patient's poor tolerance to therapy-we would recommend holding treatments unless patient is improved.  However will need to monitor closely of his lung cancer given his aggressive disease.  We will again repeat imaging in few months. ? ?# DISPOSITION: ?# HOLD Keytruda today;   IVFs 1lit; ONLY Dex 4 mg IVP today  ? ?# 1 week-labs- cbc/bmp possible   IVfs 1lit/dex ? ?# Follow up in 3 weeks- MD; labs- cbc/cmp;TSH- possible Keytruda;- Dr.B ? ?# I reviewed the blood work- with the patient in  detail; also reviewed the imaging independently [as summarized above]; and with the patient in detail.  ? ?

## 2021-08-15 NOTE — Telephone Encounter (Signed)
Pa submitted for zofran- via cover mymeds. ?- pending insurance approval ? ?Randall Ali Key: [facility] - PA Case ID: [id number] ?

## 2021-08-15 NOTE — Progress Notes (Signed)
Had dry heaves yesterday. ? ?Body pain 5/10. ? ?Appetite comes and goes. ?

## 2021-08-16 ENCOUNTER — Other Ambulatory Visit: Payer: Self-pay | Admitting: Unknown Physician Specialty

## 2021-08-16 ENCOUNTER — Ambulatory Visit
Admission: RE | Admit: 2021-08-16 | Discharge: 2021-08-16 | Disposition: A | Discharge status: Home / Self Care | Payer: PPO | Source: Ambulatory Visit | Attending: Unknown Physician Specialty | Admitting: Unknown Physician Specialty

## 2021-08-16 ENCOUNTER — Encounter: Payer: Self-pay | Admitting: Internal Medicine

## 2021-08-16 DIAGNOSIS — R059 Cough, unspecified: Secondary | ICD-10-CM

## 2021-08-16 DIAGNOSIS — J309 Allergic rhinitis, unspecified: Secondary | ICD-10-CM | POA: Diagnosis not present

## 2021-08-16 DIAGNOSIS — R519 Headache, unspecified: Secondary | ICD-10-CM | POA: Diagnosis not present

## 2021-08-22 ENCOUNTER — Inpatient Hospital Stay: Payer: PPO

## 2021-08-22 ENCOUNTER — Telehealth: Payer: Self-pay | Admitting: *Deleted

## 2021-08-22 ENCOUNTER — Inpatient Hospital Stay (HOSPITAL_BASED_OUTPATIENT_CLINIC_OR_DEPARTMENT_OTHER): Payer: PPO | Admitting: Hospice and Palliative Medicine

## 2021-08-22 ENCOUNTER — Other Ambulatory Visit: Payer: Self-pay

## 2021-08-22 VITALS — BP 130/55 | HR 65 | Temp 97.5°F | Resp 16

## 2021-08-22 DIAGNOSIS — C3432 Malignant neoplasm of lower lobe, left bronchus or lung: Secondary | ICD-10-CM | POA: Diagnosis not present

## 2021-08-22 LAB — BASIC METABOLIC PANEL
Anion gap: 8 (ref 5–15)
BUN: 24 mg/dL — ABNORMAL HIGH (ref 8–23)
CO2: 24 mmol/L (ref 22–32)
Calcium: 8.4 mg/dL — ABNORMAL LOW (ref 8.9–10.3)
Chloride: 102 mmol/L (ref 98–111)
Creatinine, Ser: 1.09 mg/dL (ref 0.61–1.24)
GFR, Estimated: >60 mL/min (ref >60)
Glucose, Bld: 134 mg/dL — ABNORMAL HIGH (ref 70–99)
Potassium: 3.6 mmol/L (ref 3.5–5.1)
Sodium: 134 mmol/L — ABNORMAL LOW (ref 135–145)

## 2021-08-22 MED ORDER — HEPARIN SOD (PORK) LOCK FLUSH 100 UNIT/ML IV SOLN
500.0000 [IU] | Freq: Once | INTRAVENOUS | Status: AC
  Administered 2021-08-22: 500 [IU] via INTRAVENOUS
  Filled 2021-08-22: qty 5

## 2021-08-22 MED ORDER — SODIUM CHLORIDE 0.9 % IV SOLN
INTRAVENOUS | Status: AC
  Filled 2021-08-22: qty 250

## 2021-08-22 MED ORDER — SODIUM CHLORIDE 0.9% FLUSH
10.0000 mL | Freq: Once | INTRAVENOUS | Status: DC
  Filled 2021-08-22: qty 10

## 2021-08-22 NOTE — Telephone Encounter (Signed)
I received an incoming request for a PA for zofran via covermymeds. ?I am uncertain why this needs to be approved again. We just received authorization on 3/8. Will investigate. ?

## 2021-08-22 NOTE — Progress Notes (Signed)
? ?Symptom Management Clinic ?[place] at [facility] ?[phone] Fax:(336) [phone] ? ?Patient Care Team: ?Ria Bush, MD as PCP - General (Family Medicine) ?Minna Merritts, MD as PCP - Cardiology (Cardiology) ?[name], MD (Internal Medicine) ?Minna Merritts, MD as Consulting Physician (Cardiology) ?Pieter Partridge, DO as Consulting Physician (Neurology) ?Cammie Sickle, MD as Consulting Physician (Hematology and Oncology) ?Telford Nab, RN as Sales executive  ? ?Name of the patient: Randall Ali  ?[id number]  ?04/05/1941  ? ?Date of visit: 08/22/21 ? ?Reason for Consult: ? ?Randall Ali is a 81 year old male with multiple medical problems including CAD status post CABG, PVD status post stenting, stage IV non-small cell lung cancer initially diagnosed May 2022 status post Carbo/Taxol now on maintenance Keytruda.  Patient has had previous and ongoing fatigue from [facility] requiring treatment holiday. ? ?Patient saw Dr. Rogue Bussing on 08/15/2021 for follow-up.  Plan is to continue holding treatment given overall poor tolerance and ongoing fatigue. ? ?Patient presents to [facility] today for IV fluids.  He continues to endorse ongoing fatigue.  However, patient does state that he feels better than he did a couple weeks ago.  He did start American ginseng 3 days ago for fatigue but cannot yet tell a difference. ? ?He denies any other significant changes or concerns.  No other symptomatic complaints at present. ? ?Denies any neurologic complaints. Denies recent fevers or illnesses. Denies any easy bleeding or bruising. Reports good appetite and denies weight loss. Denies chest pain. Denies any nausea, vomiting, constipation.  Denies urinary complaints. Patient offers no further specific complaints today. ? ?PAST MEDICAL HISTORY: ?Past Medical History:  ?Diagnosis Date  ? Allergy   ? seasonal  ? Arthritis   ? all over- in general   ? CAD (coronary artery  disease)   ? a. inferior wall MI 10/01 s/p PCI/DES to RCA; b. Myoview 3/16 neg for ischemia; c. LHC 8/16: ostLAD 80%, OM1 70%, OM2 70% x 2 lesions, mRCA 30%, dRCA 70% s/p 4-V CABG 01/24/15 (LIMA-LAD, VG- OM1, VG-OM2, VG-PDA)   ? Cancer [facility])   ? skin, melanoma  ? Carotid artery disease ([place])   ? a. [place] 8/16: 1-39% bilateral ICA stenosis  ? Cataract   ? removed  ? Diastolic dysfunction   ? a. TTE 8/16: EF 55-60%, no RWMA, Gr1DD, calcified mitral annulus, mild biatrial enlargement  ? Erectile dysfunction   ? GERD (gastroesophageal reflux disease)   ? History of elbow surgery   ? History of hiatal hernia   ? HLD (hyperlipidemia)   ? HTN (hypertension)   ? Inferior myocardial infarction [facility]) 03/2000  ? stent RCA  ? Lung cancer ([place])   ? Postoperative wound infection 02/02/2015  ? Reflux esophagitis   ? Sleep apnea 2017  ? CPAP at night  ? ? ?PAST SURGICAL HISTORY:  ?Past Surgical History:  ?Procedure Laterality Date  ? arm surgery  2010  ? BROW LIFT Bilateral 11/25/2019  ? Procedure: BROW PTOSIS REPAIR BILATERAL;  Surgeon: Karle Starch, MD;  Location: [place];  Service: Ophthalmology;  Laterality: Bilateral;  sleep apnea  ? CARDIAC CATHETERIZATION  06/24/2011  ? CARDIAC CATHETERIZATION N/A 01/18/2015  ? Procedure: Left Heart Cath with coronary angiography;  Surgeon: Minna Merritts, MD;  Location: [place] CV LAB;  Service: Cardiovascular;  Laterality: N/A;  ? CARDIAC CATHETERIZATION N/A 01/18/2015  ? Procedure: Intravascular Pressure Wire/FFR Study;  Surgeon: [name], MD;  Location: [facility] INVASIVE CV  LAB;  Service: Cardiovascular;  Laterality: N/A;  ? CAROTID STENT  03/10/2011  ? COLONOSCOPY  2010  ? COLONOSCOPY  06/14/2014  ? Dr Hilarie Fredrickson  ? CORONARY ARTERY BYPASS GRAFT N/A 01/24/2015  ? Procedure: CORONARY ARTERY BYPASS GRAFTING x 4 (LIMA-LAD, SVG-Int 1- Int 2, SVG-PD) ENDOSCOPIC GREATER SAPHENOUS VEIN HARVEST LEFT LEG;  Surgeon: [name], MD;  Location: [place];  Service: Open Heart  Surgery;  Laterality: N/A;  ? EMBOLECTOMY  06/15/2019  ? Procedure: EMBOLECTOMY;  Surgeon: Katha Cabal, MD;  Location: [facility] ORS;  Service: Vascular;;  right superficial femoral artery  ? ENDARTERECTOMY FEMORAL Right 06/15/2019  ? Procedure: ENDARTERECTOMY FEMORAL;  Surgeon: Katha Cabal, MD;  Location: [facility] ORS;  Service: Vascular;  Laterality: Right;  common femoral ?profunda femoris ?superficial femoral  ? ESOPHAGOGASTRODUODENOSCOPY (EGD) WITH PROPOFOL N/A 04/24/2016  ? Procedure: ESOPHAGOGASTRODUODENOSCOPY (EGD) WITH PROPOFOL;  Surgeon: Jerene Bears, MD;  [facility];  Service: Gastroenterology;  Laterality: N/A;  ? EYE SURGERY    ? lasik 15 yrs. ago, cataracts removed - both eyes   ? HAMMER TOE SURGERY    ? right toe  ? INSERTION OF ILIAC STENT Right 06/15/2019  ? Procedure: INSERTION OF ILIAC STENT ( STENTING OF SFA/POP ARTERY );  Surgeon: Katha Cabal, MD;  Location: [facility] ORS;  Service: Vascular;  Laterality: Right;  angioplpasty and stent placement: ?right superficial femoral ?right tibiopopliteal trunk ?bilateral common iliac arteries  ? IR IMAGING GUIDED PORT INSERTION  11/09/2020  ? LEFT HEART CATH AND CORONARY ANGIOGRAPHY Left 06/10/2017  ? Procedure: LEFT HEART CATH AND CORONARY ANGIOGRAPHY;  Surgeon: Minna Merritts, MD;  Location: [place] CV LAB;  Service: Cardiovascular;  Laterality: Left;  ? LOWER EXTREMITY ANGIOGRAPHY Left 01/04/2019  ? Procedure: LOWER EXTREMITY ANGIOGRAPHY;  Surgeon: Katha Cabal, MD;  Location: [place] CV LAB;  Service: Cardiovascular;  Laterality: Left;  ? LOWER EXTREMITY ANGIOGRAPHY Right 01/25/2019  ? Procedure: LOWER EXTREMITY ANGIOGRAPHY;  Surgeon: Katha Cabal, MD;  Location: [place] CV LAB;  Service: Cardiovascular;  Laterality: Right;  ? LOWER EXTREMITY ANGIOGRAPHY Right 01/15/2021  ? LOWER EXTREMITY ANGIOGRAPHY and stent placement to R SFA and popliteal artery (Schnier, Dolores Lory, MD)  ? NASAL SINUS SURGERY   2008  ? septpolasty, bilateral turbinate reduction  ? SHOULDER ARTHROSCOPY  2012  ? TEE WITHOUT CARDIOVERSION N/A 01/24/2015  ? Procedure: TRANSESOPHAGEAL ECHOCARDIOGRAM (TEE);  Surgeon: [name], MD;  Location: [place];  Service: Open Heart Surgery;  Laterality: N/A;  ? TOE SURGERY  1994  ? UPPER GI ENDOSCOPY  07/2014, 04-24-16  ? Dr Raquel James  ? WRIST SURGERY  2011  ? ? ?HEMATOLOGY/ONCOLOGY HISTORY:  ?Oncology History Overview Note  ?#MAY 2022-Lung cancer-non-small cell [CT guided bx] T3N1 vs stage IV Dr.Hendrickson.  MRI brain negative for malignancy.# 1. April 2022- LLL ~4.0 cm mass in the superior segment left lower lobe abuts the major fissure and the posterior pleural surface without visible chest wall invasion without pleural effusion. There 2-3 other small nodules in the left lower lobe, largest measures 9 mm in diameter. These are suspicious for same lobe satellite lesions and there is left hilar adenopathy. Assuming non-small cell lung cancer the appearance is compatible with T3 N1 M0 disease (stage IIIA). ? ?# right suprarenal nodule-awaiting biopsy on 5/31- non-small cell [revived at tumor conference at [place] ? ?# June 9th 2022- CARBO-TAXOl-KEYTRUDA; Fulphila ? ?# ? Subtle adrenal insufficiency- No acute process - -September MRI brain negative  for any pituitary hypophysitis/brain metastasis. Prednisone  ? ?MOLECULAR TESTING: NGS TPS-PDL 100%; EXON 12 amplification* mutations. ? ? ? ?# CAD [CABG 2016; Dr.Gollan] ?  ?Cancer of lower lobe of left lung ([place])  ?11/02/2020 Initial Diagnosis  ? Cancer of lower lobe of left lung ([place]) ?  ?11/02/2020 Cancer Staging  ? Staging form: Lung, AJCC 8th Edition ?- Clinical: Stage IVA (cT3, cN1, cM1a) - Signed by Cammie Sickle, MD on 11/02/2020 ?  ?11/16/2020 -  Chemotherapy  ? Patient is on Treatment Plan : LUNG NSCLC Carboplatin + Paclitaxel + Pembrolizumab q21d x 4 cycles / Pembrolizumab Maintenance Q21D  ?   ? ? ?ALLERGIES:  is allergic to flomax  [tamsulosin]. ? ?MEDICATIONS:  ?Current Outpatient Medications  ?Medication Sig Dispense Refill  ? acetaminophen (TYLENOL) 325 MG tablet Take 650 mg by mouth as needed for moderate pain or mild pain.    ? albuterol (VE

## 2021-08-22 NOTE — Telephone Encounter (Signed)
Submitted via cover mymeds. ? GRAYDEN BURLEY Key: [id number] - Rx #: [id number] ? ?Confirmed- Med already approved by insurance.  ?Prior Authorization duplicate/approved ?

## 2021-08-25 ENCOUNTER — Other Ambulatory Visit: Payer: Self-pay | Admitting: Internal Medicine

## 2021-08-29 ENCOUNTER — Inpatient Hospital Stay: Payer: PPO

## 2021-08-29 ENCOUNTER — Encounter: Payer: Self-pay | Admitting: Medical Oncology

## 2021-08-29 ENCOUNTER — Inpatient Hospital Stay: Payer: PPO | Admitting: Hospice and Palliative Medicine

## 2021-08-29 ENCOUNTER — Inpatient Hospital Stay (HOSPITAL_BASED_OUTPATIENT_CLINIC_OR_DEPARTMENT_OTHER): Payer: PPO | Admitting: Medical Oncology

## 2021-08-29 ENCOUNTER — Other Ambulatory Visit: Payer: Self-pay

## 2021-08-29 VITALS — BP 138/82 | HR 88 | Temp 97.0°F | Resp 16 | Wt 214.0 lb

## 2021-08-29 DIAGNOSIS — R042 Hemoptysis: Secondary | ICD-10-CM | POA: Diagnosis not present

## 2021-08-29 DIAGNOSIS — R5383 Other fatigue: Secondary | ICD-10-CM

## 2021-08-29 DIAGNOSIS — R051 Acute cough: Secondary | ICD-10-CM

## 2021-08-29 DIAGNOSIS — C3432 Malignant neoplasm of lower lobe, left bronchus or lung: Secondary | ICD-10-CM

## 2021-08-29 LAB — CBC WITH DIFFERENTIAL/PLATELET
Abs Immature Granulocytes: 0.3 10*3/uL — ABNORMAL HIGH (ref 0.00–0.07)
Basophils Absolute: 0.1 10*3/uL (ref 0.0–0.1)
Basophils Relative: 1 %
Eosinophils Absolute: 0.1 10*3/uL (ref 0.0–0.5)
Eosinophils Relative: 1 %
HCT: 39.3 % (ref 39.0–52.0)
Hemoglobin: 13.2 g/dL (ref 13.0–17.0)
Immature Granulocytes: 2 %
Lymphocytes Relative: 9 %
Lymphs Abs: 1.1 10*3/uL (ref 0.7–4.0)
MCH: 31.7 pg (ref 26.0–34.0)
MCHC: 33.6 g/dL (ref 30.0–36.0)
MCV: 94.2 fL (ref 80.0–100.0)
Monocytes Absolute: 0.9 10*3/uL (ref 0.1–1.0)
Monocytes Relative: 7 %
Neutro Abs: 10 10*3/uL — ABNORMAL HIGH (ref 1.7–7.7)
Neutrophils Relative %: 80 %
Platelets: 278 10*3/uL (ref 150–400)
RBC: 4.17 MIL/uL — ABNORMAL LOW (ref 4.22–5.81)
RDW: 13.3 % (ref 11.5–15.5)
WBC: 12.5 10*3/uL — ABNORMAL HIGH (ref 4.0–10.5)
nRBC: 0 % (ref 0.0–0.2)

## 2021-08-29 LAB — COMPREHENSIVE METABOLIC PANEL
ALT: 16 U/L (ref 0–44)
AST: 17 U/L (ref 15–41)
Albumin: 3.2 g/dL — ABNORMAL LOW (ref 3.5–5.0)
Alkaline Phosphatase: 40 U/L (ref 38–126)
Anion gap: 6 (ref 5–15)
BUN: 21 mg/dL (ref 8–23)
CO2: 25 mmol/L (ref 22–32)
Calcium: 8.7 mg/dL — ABNORMAL LOW (ref 8.9–10.3)
Chloride: 102 mmol/L (ref 98–111)
Creatinine, Ser: 1.01 mg/dL (ref 0.61–1.24)
GFR, Estimated: >60 mL/min (ref >60)
Glucose, Bld: 109 mg/dL — ABNORMAL HIGH (ref 70–99)
Potassium: 4.2 mmol/L (ref 3.5–5.1)
Sodium: 133 mmol/L — ABNORMAL LOW (ref 135–145)
Total Bilirubin: 0.7 mg/dL (ref 0.3–1.2)
Total Protein: 6.2 g/dL — ABNORMAL LOW (ref 6.5–8.1)

## 2021-08-29 MED ORDER — PREDNISONE 20 MG PO TABS: 60.0000 mg | ORAL_TABLET | Freq: Every day | ORAL | 0 refills | Status: DC

## 2021-08-29 MED ORDER — SODIUM CHLORIDE 0.9% FLUSH
10.0000 mL | Freq: Once | INTRAVENOUS | Status: AC | PRN
  Administered 2021-08-29: 10 mL
  Filled 2021-08-29: qty 10

## 2021-08-29 MED ORDER — SODIUM CHLORIDE 0.9 % IV SOLN
Freq: Once | INTRAVENOUS | Status: AC
  Filled 2021-08-29: qty 250

## 2021-08-29 MED ORDER — HEPARIN SOD (PORK) LOCK FLUSH 100 UNIT/ML IV SOLN
500.0000 [IU] | Freq: Once | INTRAVENOUS | Status: AC | PRN
  Administered 2021-08-29: 500 [IU]
  Filled 2021-08-29: qty 5

## 2021-08-29 NOTE — Progress Notes (Signed)
? ?Symptom Management Clinic ?[place] at [facility] ?Telephone:(336) [phone] Fax:(336) [phone] ? ?Patient Care Team: ?Ria Bush, MD as PCP - General (Family Medicine) ?Minna Merritts, MD as PCP - Cardiology (Cardiology) ?[name], MD (Internal Medicine) ?Minna Merritts, MD as Consulting Physician (Cardiology) ?Pieter Partridge, DO as Consulting Physician (Neurology) ?Cammie Sickle, MD as Consulting Physician (Hematology and Oncology) ?Telford Nab, RN as Sales executive  ? ?Name of the patient: Randall Ali  ?[id number]  ?[birth year]  ? ?Date of visit: 08/29/21 ? ?Reason for visit: ? ?Randall Ali is a 81 year old male with multiple medical problems including CAD status post CABG, PVD status post stenting, stage IV non-small cell lung cancer initially diagnosed May 2022 status post Carbo/Taxol now on maintenance Keytruda.  Patient has had previous and ongoing fatigue from [facility] requiring treatment holiday. ? ?Patient saw Dr. Rogue Bussing on 08/15/2021 for follow-up.  Plan is to continue holding treatment given overall poor tolerance and ongoing fatigue. ? ?Patient presents to [facility] today for [facility] fluids.  He continues to endorse ongoing fatigue. Has been on American ginseng  for 7 days now for fatigue which has not worsened symptoms but has not greatly improved his fatigue. He reports that he is willing to give it more time and also notes that he is more accepting that his fatigue may also be secondary to his stage 4 cancer.  ? ?He does mention some chest congestion which he has had for the past week. He also reports that this weekend he had multiple episodes of hemoptysis which he describes as wads of blood tinged sputum. This has resolved. No fevers, chest pain, new SOB. He has not tried anything but his normal allergy medication for symptoms.  ? ?Denies any neurologic complaints. Denies recent fevers or illnesses. Denies any easy bleeding or bruising.  Reports good appetite and denies weight loss. Denies chest pain. Denies any nausea, vomiting, constipation.  Denies urinary complaints. Patient offers no further specific complaints today. ? ?PAST MEDICAL HISTORY: ?Past Medical History:  ?Diagnosis Date  ? Allergy   ? seasonal  ? Arthritis   ? all over- in general   ? CAD (coronary artery disease)   ? a. inferior wall MI 10/01 s/p PCI/DES to RCA; b. Myoview 3/16 neg for ischemia; c. LHC 8/16: ostLAD 80%, OM1 70%, OM2 70% x 2 lesions, mRCA 30%, dRCA 70% s/p 4-V CABG 01/24/15 (LIMA-LAD, VG- OM1, VG-OM2, VG-PDA)   ? Cancer [facility])   ? skin, melanoma  ? Carotid artery disease ([place])   ? a. [place] 8/16: 1-39% bilateral ICA stenosis  ? Cataract   ? removed  ? Diastolic dysfunction   ? a. TTE 8/16: EF 55-60%, no RWMA, Gr1DD, calcified mitral annulus, mild biatrial enlargement  ? Erectile dysfunction   ? GERD (gastroesophageal reflux disease)   ? History of elbow surgery   ? History of hiatal hernia   ? HLD (hyperlipidemia)   ? HTN (hypertension)   ? Inferior myocardial infarction [facility]) 03/2000  ? stent RCA  ? Lung cancer ([place])   ? Postoperative wound infection 02/02/2015  ? Reflux esophagitis   ? Sleep apnea 2017  ? CPAP at night  ? ? ?PAST SURGICAL HISTORY:  ?Past Surgical History:  ?Procedure Laterality Date  ? arm surgery  2010  ? BROW LIFT Bilateral 11/25/2019  ? Procedure: BROW PTOSIS REPAIR BILATERAL;  Surgeon: Karle Starch, MD;  Location: [place];  Service: Ophthalmology;  Laterality: Bilateral;  sleep apnea  ? CARDIAC CATHETERIZATION  06/24/2011  ? CARDIAC CATHETERIZATION N/A 01/18/2015  ? Procedure: Left Heart Cath with coronary angiography;  Surgeon: Minna Merritts, MD;  Location: [place] CV LAB;  Service: Cardiovascular;  Laterality: N/A;  ? CARDIAC CATHETERIZATION N/A 01/18/2015  ? Procedure: Intravascular Pressure Wire/FFR Study;  Surgeon: [name], MD;  Location: [place] CV LAB;  Service: Cardiovascular;  Laterality: N/A;  ? CAROTID  STENT  03/10/2011  ? COLONOSCOPY  2010  ? COLONOSCOPY  06/14/2014  ? Dr Hilarie Fredrickson  ? CORONARY ARTERY BYPASS GRAFT N/A 01/24/2015  ? Procedure: CORONARY ARTERY BYPASS GRAFTING x 4 (LIMA-LAD, SVG-Int 1- Int 2, SVG-PD) ENDOSCOPIC GREATER SAPHENOUS VEIN HARVEST LEFT LEG;  Surgeon: [name], MD;  Location: [place];  Service: Open Heart Surgery;  Laterality: N/A;  ? EMBOLECTOMY  06/15/2019  ? Procedure: EMBOLECTOMY;  Surgeon: Katha Cabal, MD;  Location: [facility];  Service: Vascular;;  right superficial femoral artery  ? ENDARTERECTOMY FEMORAL Right 06/15/2019  ? Procedure: ENDARTERECTOMY FEMORAL;  Surgeon: Katha Cabal, MD;  Location: [facility];  Service: Vascular;  Laterality: Right;  common femoral ?profunda femoris ?superficial femoral  ? ESOPHAGOGASTRODUODENOSCOPY (EGD) WITH PROPOFOL N/A 04/24/2016  ? Procedure: ESOPHAGOGASTRODUODENOSCOPY (EGD) WITH PROPOFOL;  Surgeon: Jerene Bears, MD;  [facility];  Service: Gastroenterology;  Laterality: N/A;  ? EYE SURGERY    ? lasik 15 yrs. ago, cataracts removed - both eyes   ? HAMMER TOE SURGERY    ? right toe  ? INSERTION OF ILIAC STENT Right 06/15/2019  ? Procedure: INSERTION OF ILIAC STENT ( STENTING OF SFA/POP ARTERY );  Surgeon: Katha Cabal, MD;  Location: [facility];  Service: Vascular;  Laterality: Right;  angioplpasty and stent placement: ?right superficial femoral ?right tibiopopliteal trunk ?bilateral common iliac arteries  ? IR IMAGING GUIDED PORT INSERTION  11/09/2020  ? LEFT HEART CATH AND CORONARY ANGIOGRAPHY Left 06/10/2017  ? Procedure: LEFT HEART CATH AND CORONARY ANGIOGRAPHY;  Surgeon: Minna Merritts, MD;  Location: [place] CV LAB;  Service: Cardiovascular;  Laterality: Left;  ? LOWER EXTREMITY ANGIOGRAPHY Left 01/04/2019  ? Procedure: LOWER EXTREMITY ANGIOGRAPHY;  Surgeon: Katha Cabal, MD;  Location: [place] CV LAB;  Service: Cardiovascular;  Laterality: Left;  ? LOWER EXTREMITY ANGIOGRAPHY Right  01/25/2019  ? Procedure: LOWER EXTREMITY ANGIOGRAPHY;  Surgeon: Katha Cabal, MD;  Location: [place] CV LAB;  Service: Cardiovascular;  Laterality: Right;  ? LOWER EXTREMITY ANGIOGRAPHY Right 01/15/2021  ? LOWER EXTREMITY ANGIOGRAPHY and stent placement to R SFA and popliteal artery (Schnier, Dolores Lory, MD)  ? NASAL SINUS SURGERY  2008  ? septpolasty, bilateral turbinate reduction  ? SHOULDER ARTHROSCOPY  2012  ? TEE WITHOUT CARDIOVERSION N/A 01/24/2015  ? Procedure: TRANSESOPHAGEAL ECHOCARDIOGRAM (TEE);  Surgeon: [name], MD;  Location: [place];  Service: Open Heart Surgery;  Laterality: N/A;  ? TOE SURGERY  1994  ? UPPER GI ENDOSCOPY  07/2014, 04-24-16  ? Dr Raquel James  ? WRIST SURGERY  2011  ? ? ?HEMATOLOGY/ONCOLOGY HISTORY:  ?Oncology History Overview Note  ?#MAY 2022-Lung cancer-non-small cell [CT guided bx] T3N1 vs stage IV Dr.Hendrickson.  MRI brain negative for malignancy.# 1. April 2022- LLL ~4.0 cm mass in the superior segment left lower lobe abuts the major fissure and the posterior pleural surface without visible chest wall invasion without pleural effusion. There 2-3 other small nodules in the left lower lobe, largest measures 9 mm in diameter. These are  suspicious for same lobe satellite lesions and there is left hilar adenopathy. Assuming non-small cell lung cancer the appearance is compatible with T3 N1 M0 disease (stage IIIA). ? ?# right suprarenal nodule-awaiting biopsy on 5/31- non-small cell [revived at tumor conference at [place] ? ?# June 9th 2022- CARBO-TAXOl-KEYTRUDA; Fulphila ? ?# ? Subtle adrenal insufficiency- No acute process - -September MRI brain negative for any pituitary hypophysitis/brain metastasis. Prednisone  ? ?MOLECULAR TESTING: NGS TPS-PDL 100%; EXON 12 amplification* mutations. ? ? ? ?# CAD [CABG 2016; Dr.Gollan] ?  ?Cancer of lower lobe of left lung ([place])  ?11/02/2020 Initial Diagnosis  ? Cancer of lower lobe of left lung ([place]) ?  ?11/02/2020 Cancer Staging  ?  Staging form: Lung, AJCC 8th Edition ?- Clinical: Stage IVA (cT3, cN1, cM1a) - Signed by Cammie Sickle, MD on 11/02/2020 ? ?  ?11/16/2020 -  Chemotherapy  ? Patient is on Treatment Plan : LUNG NSCLC Carboplati

## 2021-08-30 ENCOUNTER — Ambulatory Visit
Admission: RE | Admit: 2021-08-30 | Discharge: 2021-08-30 | Disposition: A | Discharge status: Home / Self Care | Payer: PPO | Source: Ambulatory Visit | Attending: Medical Oncology | Admitting: Medical Oncology

## 2021-08-30 ENCOUNTER — Telehealth: Payer: Self-pay | Admitting: *Deleted

## 2021-08-30 ENCOUNTER — Ambulatory Visit
Admission: RE | Admit: 2021-08-30 | Discharge: 2021-08-30 | Disposition: A | Discharge status: Home / Self Care | Payer: PPO | Attending: Internal Medicine | Admitting: Internal Medicine

## 2021-08-30 ENCOUNTER — Telehealth: Payer: Self-pay

## 2021-08-30 DIAGNOSIS — R051 Acute cough: Secondary | ICD-10-CM | POA: Diagnosis not present

## 2021-08-30 DIAGNOSIS — R042 Hemoptysis: Secondary | ICD-10-CM

## 2021-08-30 DIAGNOSIS — C3432 Malignant neoplasm of lower lobe, left bronchus or lung: Secondary | ICD-10-CM | POA: Insufficient documentation

## 2021-08-30 DIAGNOSIS — R918 Other nonspecific abnormal finding of lung field: Secondary | ICD-10-CM | POA: Diagnosis not present

## 2021-08-30 MED ORDER — AMOXICILLIN-POT CLAVULANATE 875-125 MG PO TABS: 1.0000 | ORAL_TABLET | Freq: Two times a day (BID) | ORAL | 0 refills | Status: AC

## 2021-08-30 NOTE — Telephone Encounter (Signed)
Patient call and left message that he is not feeling well again, (I feel so bad". He started feeling bad yesterday and is asking if anything can be done for him. It is a nondescript complaint as previously noted. Please advise. ?

## 2021-08-30 NOTE — Telephone Encounter (Signed)
Notified patient of chest x-ray results, and informed that antibiotic has been sent in to pharmacy. Reviewed administration instructions and recommended to take with food. Pt verbalized understanding.  ?

## 2021-08-30 NOTE — Telephone Encounter (Signed)
Call returned to patient and advised he needs to get his CXR done and they will call him with results. He said he will go now ?

## 2021-09-05 ENCOUNTER — Inpatient Hospital Stay: Payer: PPO

## 2021-09-05 ENCOUNTER — Inpatient Hospital Stay (HOSPITAL_BASED_OUTPATIENT_CLINIC_OR_DEPARTMENT_OTHER): Payer: PPO | Admitting: Oncology

## 2021-09-05 ENCOUNTER — Encounter: Payer: Self-pay | Admitting: Oncology

## 2021-09-05 VITALS — BP 132/72 | HR 72 | Temp 96.8°F | Resp 18 | Wt 209.7 lb

## 2021-09-05 DIAGNOSIS — C3432 Malignant neoplasm of lower lobe, left bronchus or lung: Secondary | ICD-10-CM

## 2021-09-05 DIAGNOSIS — R5383 Other fatigue: Secondary | ICD-10-CM

## 2021-09-05 DIAGNOSIS — J189 Pneumonia, unspecified organism: Secondary | ICD-10-CM | POA: Diagnosis not present

## 2021-09-05 LAB — CBC WITH DIFFERENTIAL/PLATELET
Abs Immature Granulocytes: 0.55 10*3/uL — ABNORMAL HIGH (ref 0.00–0.07)
Basophils Absolute: 0.1 10*3/uL (ref 0.0–0.1)
Basophils Relative: 1 %
Eosinophils Absolute: 0 10*3/uL (ref 0.0–0.5)
Eosinophils Relative: 0 %
HCT: 41.1 % (ref 39.0–52.0)
Hemoglobin: 13.9 g/dL (ref 13.0–17.0)
Immature Granulocytes: 4 %
Lymphocytes Relative: 8 %
Lymphs Abs: 1.2 10*3/uL (ref 0.7–4.0)
MCH: 31.5 pg (ref 26.0–34.0)
MCHC: 33.8 g/dL (ref 30.0–36.0)
MCV: 93.2 fL (ref 80.0–100.0)
Monocytes Absolute: 0.7 10*3/uL (ref 0.1–1.0)
Monocytes Relative: 5 %
Neutro Abs: 13 10*3/uL — ABNORMAL HIGH (ref 1.7–7.7)
Neutrophils Relative %: 82 %
Platelets: 256 10*3/uL (ref 150–400)
RBC: 4.41 MIL/uL (ref 4.22–5.81)
RDW: 13.2 % (ref 11.5–15.5)
WBC: 15.6 10*3/uL — ABNORMAL HIGH (ref 4.0–10.5)
nRBC: 0 % (ref 0.0–0.2)

## 2021-09-05 LAB — COMPREHENSIVE METABOLIC PANEL
ALT: 17 U/L (ref 0–44)
AST: 13 U/L — ABNORMAL LOW (ref 15–41)
Albumin: 3.4 g/dL — ABNORMAL LOW (ref 3.5–5.0)
Alkaline Phosphatase: 42 U/L (ref 38–126)
Anion gap: 7 (ref 5–15)
BUN: 27 mg/dL — ABNORMAL HIGH (ref 8–23)
CO2: 26 mmol/L (ref 22–32)
Calcium: 8.8 mg/dL — ABNORMAL LOW (ref 8.9–10.3)
Chloride: 102 mmol/L (ref 98–111)
Creatinine, Ser: 1.01 mg/dL (ref 0.61–1.24)
GFR, Estimated: >60 mL/min (ref >60)
Glucose, Bld: 119 mg/dL — ABNORMAL HIGH (ref 70–99)
Potassium: 3.6 mmol/L (ref 3.5–5.1)
Sodium: 135 mmol/L (ref 135–145)
Total Bilirubin: 0.8 mg/dL (ref 0.3–1.2)
Total Protein: 6.3 g/dL — ABNORMAL LOW (ref 6.5–8.1)

## 2021-09-05 LAB — TSH: TSH: 0.57 u[IU]/mL (ref 0.350–4.500)

## 2021-09-05 MED ORDER — HEPARIN SOD (PORK) LOCK FLUSH 100 UNIT/ML IV SOLN
500.0000 [IU] | Freq: Once | INTRAVENOUS | Status: AC
  Administered 2021-09-05: 500 [IU] via INTRAVENOUS
  Filled 2021-09-05: qty 5

## 2021-09-05 NOTE — Progress Notes (Signed)
[place] ?CONSULT NOTE ? ?Patient Care Team: ?Ria Bush, MD as PCP - General (Family Medicine) ?Minna Merritts, MD as PCP - Cardiology (Cardiology) ?[name], MD (Internal Medicine) ?Minna Merritts, MD as Consulting Physician (Cardiology) ?Pieter Partridge, DO as Consulting Physician (Neurology) ?Cammie Sickle, MD as Consulting Physician (Hematology and Oncology) ?Telford Nab, RN as Sales executive ? ?CHIEF COMPLAINTS/PURPOSE OF CONSULTATION: lung cancer ? ? ? ?Oncology History Overview Note  ?#MAY 2022-Lung cancer-non-small cell [CT guided bx] T3N1 vs stage IV Dr.Hendrickson.  MRI brain negative for malignancy.# 1. April 2022- LLL ~4.0 cm mass in the superior segment left lower lobe abuts the major fissure and the posterior pleural surface without visible chest wall invasion without pleural effusion. There 2-3 other small nodules in the left lower lobe, largest measures 9 mm in diameter. These are suspicious for same lobe satellite lesions and there is left hilar adenopathy. Assuming non-small cell lung cancer the appearance is compatible with T3 N1 M0 disease (stage IIIA). ? ?# right suprarenal nodule-awaiting biopsy on 5/31- non-small cell [revived at tumor conference at [place] ? ?# June 9th 2022- CARBO-TAXOl-KEYTRUDA; Fulphila ? ?# ? Subtle adrenal insufficiency- No acute process - -September MRI brain negative for any pituitary hypophysitis/brain metastasis. Prednisone  ? ?MOLECULAR TESTING: NGS TPS-PDL 100%; EXON 12 amplification* mutations. ? ? ? ?# CAD [CABG 2016; Dr.Gollan] ?  ?Cancer of lower lobe of left lung ([place])  ?11/02/2020 Initial Diagnosis  ? Cancer of lower lobe of left lung ([place]) ?  ?11/02/2020 Cancer Staging  ? Staging form: Lung, AJCC 8th Edition ?- Clinical: Stage IVA (cT3, cN1, cM1a) - Signed by Cammie Sickle, MD on 11/02/2020 ?  ?11/16/2020 -  Chemotherapy  ? Patient is on Treatment Plan : LUNG NSCLC Carboplatin + Paclitaxel +  Pembrolizumab q21d x 4 cycles / Pembrolizumab Maintenance Q21D  ?   ? ? ? ?HISTORY OF PRESENTING ILLNESS:  ? ?[name] 81 y.o.  male lung cancer-non-small cell stage IV [supra-renal nodule s/p biopsy] currently on Keytruda is for follow-up. ? ?Patient reports feeling quite fatigue. He has felt congested, no nausea vomiting.  ?08/30/2021 chest Xray Infiltrative opacity in the superior segment of left lower lobe, similar to comparison chest CT and favored represent infectious/inflammatory etiology. ?Patient continues to take prednisone 20 mg a day.   ? ?Sinus pressure has improved, he has been seen by ENT recently.  ? ?Review of Systems  ?Constitutional:  Positive for malaise/fatigue. Negative for chills, diaphoresis, fever and weight loss.  ?HENT:  Negative for nosebleeds and sore throat.   ?Eyes:  Negative for double vision.  ?Respiratory:  Negative for cough, hemoptysis, sputum production, shortness of breath and wheezing.   ?Cardiovascular:  Negative for chest pain, palpitations, orthopnea and leg swelling.  ?Gastrointestinal:  Negative for abdominal pain, blood in stool, constipation, diarrhea, heartburn, melena, nausea and vomiting.  ?Genitourinary:  Negative for dysuria, frequency and urgency.  ?Musculoskeletal:  Positive for back pain and joint pain.  ?Skin: Negative.  Negative for itching and rash.  ?Neurological:  Negative for dizziness, tingling, focal weakness, weakness and headaches.  ?Endo/Heme/Allergies:  Does not bruise/bleed easily.  ?Psychiatric/Behavioral:  Negative for depression. The patient is not nervous/anxious and does not have insomnia.    ? ?MEDICAL HISTORY:  ?Past Medical History:  ?Diagnosis Date  ? Allergy   ? seasonal  ? Arthritis   ? all over- in general   ? CAD (coronary artery disease)   ?  a. inferior wall MI 10/01 s/p PCI/DES to RCA; b. Myoview 3/16 neg for ischemia; c. LHC 8/16: ostLAD 80%, OM1 70%, OM2 70% x 2 lesions, mRCA 30%, dRCA 70% s/p 4-V CABG 01/24/15 (LIMA-LAD,  VG- OM1, VG-OM2, VG-PDA)   ? Cancer [facility])   ? skin, melanoma  ? Carotid artery disease ([place])   ? a. [place] 8/16: 1-39% bilateral ICA stenosis  ? Cataract   ? removed  ? Diastolic dysfunction   ? a. TTE 8/16: EF 55-60%, no RWMA, Gr1DD, calcified mitral annulus, mild biatrial enlargement  ? Erectile dysfunction   ? GERD (gastroesophageal reflux disease)   ? History of elbow surgery   ? History of hiatal hernia   ? HLD (hyperlipidemia)   ? HTN (hypertension)   ? 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 stent RCA  ? Lung cancer ([place])   ? Postoperative wound infection 02/02/2015  ? Reflux esophagitis   ? Sleep apnea 2017  ? CPAP at night  ? ? ?SURGICAL HISTORY: ?Past Surgical History:  ?Procedure Laterality Date  ? arm surgery  2010  ? BROW LIFT Bilateral 11/25/2019  ? Procedure: BROW PTOSIS REPAIR BILATERAL;  Surgeon: Karle Starch, MD;  Location: [place];  Service: Ophthalmology;  Laterality: Bilateral;  sleep apnea  ? CARDIAC CATHETERIZATION  06/24/2011  ? CARDIAC CATHETERIZATION N/A 01/18/2015  ? Procedure: Left Heart Cath with coronary angiography;  Surgeon: Minna Merritts, MD;  Location: [place] CV LAB;  Service: Cardiovascular;  Laterality: N/A;  ? CARDIAC CATHETERIZATION N/A 01/18/2015  ? Procedure: Intravascular Pressure Wire/FFR Study;  Surgeon: [name], MD;  Location: [place] CV LAB;  Service: Cardiovascular;  Laterality: N/A;  ? CAROTID STENT  03/10/2011  ? COLONOSCOPY  2010  ? COLONOSCOPY  06/14/2014  ? Dr Hilarie Fredrickson  ? CORONARY ARTERY BYPASS GRAFT N/A 01/24/2015  ? Procedure: CORONARY ARTERY BYPASS GRAFTING x 4 (LIMA-LAD, SVG-Int 1- Int 2, SVG-PD) ENDOSCOPIC GREATER SAPHENOUS VEIN HARVEST LEFT LEG;  Surgeon: [name], MD;  Location: [place];  Service: Open Heart Surgery;  Laterality: N/A;  ? EMBOLECTOMY  06/15/2019  ? Procedure: EMBOLECTOMY;  Surgeon: Katha Cabal, MD;  Location: [facility];  Service: Vascular;;  right superficial femoral artery  ? ENDARTERECTOMY  FEMORAL Right 06/15/2019  ? Procedure: ENDARTERECTOMY FEMORAL;  Surgeon: Katha Cabal, MD;  Location: [facility];  Service: Vascular;  Laterality: Right;  common femoral ?profunda femoris ?superficial femoral  ? ESOPHAGOGASTRODUODENOSCOPY (EGD) WITH PROPOFOL N/A 04/24/2016  ? Procedure: ESOPHAGOGASTRODUODENOSCOPY (EGD) WITH PROPOFOL;  Surgeon: Jerene Bears, MD;  [facility];  Service: Gastroenterology;  Laterality: N/A;  ? EYE SURGERY    ? lasik 15 yrs. ago, cataracts removed - both eyes   ? HAMMER TOE SURGERY    ? right toe  ? INSERTION OF ILIAC STENT Right 06/15/2019  ? Procedure: INSERTION OF ILIAC STENT ( STENTING OF SFA/POP ARTERY );  Surgeon: Katha Cabal, MD;  Location: [facility];  Service: Vascular;  Laterality: Right;  angioplpasty and stent placement: ?right superficial femoral ?right tibiopopliteal trunk ?bilateral common iliac arteries  ? IR IMAGING GUIDED PORT INSERTION  11/09/2020  ? LEFT HEART CATH AND CORONARY ANGIOGRAPHY Left 06/10/2017  ? Procedure: LEFT HEART CATH AND CORONARY ANGIOGRAPHY;  Surgeon: Minna Merritts, MD;  Location: [place] CV LAB;  Service: Cardiovascular;  Laterality: Left;  ? LOWER EXTREMITY ANGIOGRAPHY Left 01/04/2019  ? Procedure: LOWER EXTREMITY ANGIOGRAPHY;  Surgeon: Katha Cabal, MD;  Location: [facility]  INVASIVE CV LAB;  Service: Cardiovascular;  Laterality: Left;  ? LOWER EXTREMITY ANGIOGRAPHY Right 01/25/2019  ? Procedure: LOWER EXTREMITY ANGIOGRAPHY;  Surgeon: Katha Cabal, MD;  Location: [place] CV LAB;  Service: Cardiovascular;  Laterality: Right;  ? LOWER EXTREMITY ANGIOGRAPHY Right 01/15/2021  ? LOWER EXTREMITY ANGIOGRAPHY and stent placement to R SFA and popliteal artery (Schnier, Dolores Lory, MD)  ? NASAL SINUS SURGERY  2008  ? septpolasty, bilateral turbinate reduction  ? SHOULDER ARTHROSCOPY  2012  ? TEE WITHOUT CARDIOVERSION N/A 01/24/2015  ? Procedure: TRANSESOPHAGEAL ECHOCARDIOGRAM (TEE);  Surgeon: [name], MD;   Location: [place];  Service: Open Heart Surgery;  Laterality: N/A;  ? TOE SURGERY  1994  ? UPPER GI ENDOSCOPY  07/2014, 04-24-16  ? Dr Raquel James  ? WRIST SURGERY  2011  ? ? ?SOCIAL HISTORY: ?Social History  ? ?Soci

## 2021-09-05 NOTE — Progress Notes (Signed)
Pt here for follow up. Pt reports low energy levels. He is currently on antibiotic for pneumonia/ congestion, still has 3 more doses of antibiotic left  ?

## 2021-09-07 ENCOUNTER — Other Ambulatory Visit: Payer: Self-pay | Admitting: Cardiovascular Disease

## 2021-09-07 ENCOUNTER — Other Ambulatory Visit: Payer: Self-pay | Admitting: Internal Medicine

## 2021-09-07 ENCOUNTER — Other Ambulatory Visit: Payer: Self-pay | Admitting: Family Medicine

## 2021-09-09 NOTE — Telephone Encounter (Signed)
ERx 

## 2021-09-09 NOTE — Telephone Encounter (Signed)
Refilled

## 2021-09-09 NOTE — Telephone Encounter (Signed)
Refill request tramadol ?Last refill 08/07/21 #90 ?Last office visit 07/15/21 ?

## 2021-09-19 ENCOUNTER — Inpatient Hospital Stay: Payer: PPO

## 2021-09-19 ENCOUNTER — Inpatient Hospital Stay: Payer: PPO | Attending: Internal Medicine

## 2021-09-19 ENCOUNTER — Encounter: Payer: Self-pay | Admitting: Oncology

## 2021-09-19 ENCOUNTER — Inpatient Hospital Stay: Payer: PPO | Admitting: Oncology

## 2021-09-19 VITALS — BP 148/79 | HR 66 | Temp 97.0°F | Ht 68.0 in | Wt 218.0 lb

## 2021-09-19 VITALS — BP 121/66 | HR 62

## 2021-09-19 DIAGNOSIS — R5383 Other fatigue: Secondary | ICD-10-CM | POA: Diagnosis not present

## 2021-09-19 DIAGNOSIS — Z79899 Other long term (current) drug therapy: Secondary | ICD-10-CM | POA: Insufficient documentation

## 2021-09-19 DIAGNOSIS — C3432 Malignant neoplasm of lower lobe, left bronchus or lung: Secondary | ICD-10-CM | POA: Diagnosis not present

## 2021-09-19 DIAGNOSIS — M9905 Segmental and somatic dysfunction of pelvic region: Secondary | ICD-10-CM | POA: Diagnosis not present

## 2021-09-19 DIAGNOSIS — Z5111 Encounter for antineoplastic chemotherapy: Secondary | ICD-10-CM | POA: Diagnosis not present

## 2021-09-19 DIAGNOSIS — Z5112 Encounter for antineoplastic immunotherapy: Secondary | ICD-10-CM | POA: Insufficient documentation

## 2021-09-19 DIAGNOSIS — M79671 Pain in right foot: Secondary | ICD-10-CM | POA: Insufficient documentation

## 2021-09-19 DIAGNOSIS — Z87891 Personal history of nicotine dependence: Secondary | ICD-10-CM | POA: Insufficient documentation

## 2021-09-19 DIAGNOSIS — M955 Acquired deformity of pelvis: Secondary | ICD-10-CM | POA: Diagnosis not present

## 2021-09-19 DIAGNOSIS — M9903 Segmental and somatic dysfunction of lumbar region: Secondary | ICD-10-CM | POA: Diagnosis not present

## 2021-09-19 DIAGNOSIS — M62830 Muscle spasm of back: Secondary | ICD-10-CM | POA: Diagnosis not present

## 2021-09-19 LAB — CBC WITH DIFFERENTIAL/PLATELET
Abs Immature Granulocytes: 0.47 10*3/uL — ABNORMAL HIGH (ref 0.00–0.07)
Basophils Absolute: 0.1 10*3/uL (ref 0.0–0.1)
Basophils Relative: 1 %
Eosinophils Absolute: 0 10*3/uL (ref 0.0–0.5)
Eosinophils Relative: 0 %
HCT: 41.2 % (ref 39.0–52.0)
Hemoglobin: 13.9 g/dL (ref 13.0–17.0)
Immature Granulocytes: 4 %
Lymphocytes Relative: 22 %
Lymphs Abs: 2.4 10*3/uL (ref 0.7–4.0)
MCH: 31.9 pg (ref 26.0–34.0)
MCHC: 33.7 g/dL (ref 30.0–36.0)
MCV: 94.5 fL (ref 80.0–100.0)
Monocytes Absolute: 0.8 10*3/uL (ref 0.1–1.0)
Monocytes Relative: 7 %
Neutro Abs: 7.4 10*3/uL (ref 1.7–7.7)
Neutrophils Relative %: 66 %
Platelets: 168 10*3/uL (ref 150–400)
RBC: 4.36 MIL/uL (ref 4.22–5.81)
RDW: 13.6 % (ref 11.5–15.5)
WBC: 11.2 10*3/uL — ABNORMAL HIGH (ref 4.0–10.5)
nRBC: 0 % (ref 0.0–0.2)

## 2021-09-19 LAB — COMPREHENSIVE METABOLIC PANEL
ALT: 17 U/L (ref 0–44)
AST: 15 U/L (ref 15–41)
Albumin: 3.5 g/dL (ref 3.5–5.0)
Alkaline Phosphatase: 37 U/L — ABNORMAL LOW (ref 38–126)
Anion gap: 7 (ref 5–15)
BUN: 32 mg/dL — ABNORMAL HIGH (ref 8–23)
CO2: 24 mmol/L (ref 22–32)
Calcium: 8.6 mg/dL — ABNORMAL LOW (ref 8.9–10.3)
Chloride: 105 mmol/L (ref 98–111)
Creatinine, Ser: 0.98 mg/dL (ref 0.61–1.24)
GFR, Estimated: >60 mL/min (ref >60)
Glucose, Bld: 129 mg/dL — ABNORMAL HIGH (ref 70–99)
Potassium: 3.5 mmol/L (ref 3.5–5.1)
Sodium: 136 mmol/L (ref 135–145)
Total Bilirubin: 0.7 mg/dL (ref 0.3–1.2)
Total Protein: 6 g/dL — ABNORMAL LOW (ref 6.5–8.1)

## 2021-09-19 MED ORDER — SODIUM CHLORIDE 0.9 % IV SOLN
200.0000 mg | Freq: Once | INTRAVENOUS | Status: AC
  Administered 2021-09-19: 200 mg via INTRAVENOUS
  Filled 2021-09-19: qty 200

## 2021-09-19 MED ORDER — HEPARIN SOD (PORK) LOCK FLUSH 100 UNIT/ML IV SOLN
500.0000 [IU] | Freq: Once | INTRAVENOUS | Status: AC | PRN
  Administered 2021-09-19: 500 [IU]
  Filled 2021-09-19: qty 5

## 2021-09-19 MED ORDER — SODIUM CHLORIDE 0.9 % IV SOLN
Freq: Once | INTRAVENOUS | Status: AC
  Filled 2021-09-19: qty 250

## 2021-09-19 MED ORDER — PREDNISONE 20 MG PO TABS: 20.0000 mg | ORAL_TABLET | Freq: Every day | ORAL | 0 refills | Status: DC

## 2021-09-19 MED ORDER — HEPARIN SOD (PORK) LOCK FLUSH 100 UNIT/ML IV SOLN
INTRAVENOUS | Status: AC
  Filled 2021-09-19: qty 5

## 2021-09-19 NOTE — Progress Notes (Addendum)
?[place] ?CONSULT NOTE ? ?Patient Care Team: ?Ria Bush, MD as PCP - General (Family Medicine) ?Minna Merritts, MD as PCP - Cardiology (Cardiology) ?[name], MD (Internal Medicine) ?Minna Merritts, MD as Consulting Physician (Cardiology) ?Pieter Partridge, DO as Consulting Physician (Neurology) ?Cammie Sickle, MD as Consulting Physician (Hematology and Oncology) ?Telford Nab, RN as Sales executive ? ?CHIEF COMPLAINTS/PURPOSE OF CONSULTATION: lung cancer ?Follow up for lung cancer ? ? ?Oncology History Overview Note  ?#MAY 2022-Lung cancer-non-small cell [CT guided bx] T3N1 vs stage IV Dr.Hendrickson.  MRI brain negative for malignancy.# 1. April 2022- LLL ~4.0 cm mass in the superior segment left lower lobe abuts the major fissure and the posterior pleural surface without visible chest wall invasion without pleural effusion. There 2-3 other small nodules in the left lower lobe, largest measures 9 mm in diameter. These are suspicious for same lobe satellite lesions and there is left hilar adenopathy. Assuming non-small cell lung cancer the appearance is compatible with T3 N1 M0 disease (stage IIIA). ? ?# right suprarenal nodule-awaiting biopsy on 5/31- non-small cell [revived at tumor conference at [place] ? ?# June 9th 2022- CARBO-TAXOl-KEYTRUDA; Fulphila ? ?# ? Subtle adrenal insufficiency- No acute process - -September MRI brain negative for any pituitary hypophysitis/brain metastasis. Prednisone  ? ?MOLECULAR TESTING: NGS TPS-PDL 100%; EXON 12 amplification* mutations. ? ? ? ?# CAD [CABG 2016; Dr.Gollan] ?  ?Cancer of lower lobe of left lung ([address])  ?11/02/2020 Initial Diagnosis  ? Cancer of lower lobe of left lung ([place]) ?  ?11/02/2020 Cancer Staging  ? Staging form: Lung, AJCC 8th Edition ?- Clinical: Stage IVA (cT3, cN1, cM1a) - Signed by Cammie Sickle, MD on 11/02/2020 ?  ?11/16/2020 -  Chemotherapy  ? Patient is on Treatment Plan : LUNG NSCLC  Carboplatin + Paclitaxel + Pembrolizumab q21d x 4 cycles / Pembrolizumab Maintenance Q21D  ?   ? ? ? ?HISTORY OF PRESENTING ILLNESS:  ? ?[facility]. 81 y.o.  male lung cancer-non-small cell stage IV [supra-renal nodule s/p biopsy] currently on Keytruda is for follow-up. ? Patient follows up with Dr.Brahmanday who is off today, I am covering him to see this patient. ?08/30/2021 chest Xray Infiltrative opacity in the superior segment of left lower lobe, similar to comparison chest CT and favored represent infectious/inflammatory etiology.  Patient has finished a course of antibiotics.  Patient reports that congestion, cough has improved. ?Patient has been on prednisone 60 mg, patient says " steorid does nothing but makes me eat".  ?Continues to feel " not good".  Patient reports that he feels tired and fatigued for months. ? ? ?Review of Systems  ?Constitutional:  Positive for malaise/fatigue. Negative for chills, diaphoresis, fever and weight loss.  ?HENT:  Negative for nosebleeds and sore throat.   ?Eyes:  Negative for double vision.  ?Respiratory:  Negative for cough, hemoptysis, sputum production, shortness of breath and wheezing.   ?Cardiovascular:  Negative for chest pain, palpitations, orthopnea and leg swelling.  ?Gastrointestinal:  Negative for abdominal pain, blood in stool, constipation, diarrhea, heartburn, melena, nausea and vomiting.  ?Genitourinary:  Negative for dysuria, frequency and urgency.  ?Musculoskeletal:  Positive for back pain and joint pain.  ?Skin: Negative.  Negative for itching and rash.  ?Neurological:  Negative for dizziness, tingling, focal weakness, weakness and headaches.  ?Endo/Heme/Allergies:  Does not bruise/bleed easily.  ?Psychiatric/Behavioral:  Negative for depression. The patient is not nervous/anxious and does not have insomnia.    ? ?  MEDICAL HISTORY:  ?Past Medical History:  ?Diagnosis Date  ? Allergy   ? seasonal  ? Arthritis   ? all over- in general   ? CAD  (coronary artery disease)   ? a. inferior wall MI 10/01 s/p PCI/DES to RCA; b. Myoview 3/16 neg for ischemia; c. LHC 8/16: ostLAD 80%, OM1 70%, OM2 70% x 2 lesions, mRCA 30%, dRCA 70% s/p 4-V CABG 01/24/15 (LIMA-LAD, VG- OM1, VG-OM2, VG-PDA)   ? Cancer [facility])   ? skin, melanoma  ? Carotid artery disease ([place])   ? a. [place] 8/16: 1-39% bilateral ICA stenosis  ? Cataract   ? removed  ? Diastolic dysfunction   ? a. TTE 8/16: EF 55-60%, no RWMA, Gr1DD, calcified mitral annulus, mild biatrial enlargement  ? Erectile dysfunction   ? GERD (gastroesophageal reflux disease)   ? History of elbow surgery   ? History of hiatal hernia   ? HLD (hyperlipidemia)   ? HTN (hypertension)   ? 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 stent RCA  ? Lung cancer ([place])   ? Postoperative wound infection 02/02/2015  ? Reflux esophagitis   ? Sleep apnea 2017  ? CPAP at night  ? ? ?SURGICAL HISTORY: ?Past Surgical History:  ?Procedure Laterality Date  ? arm surgery  2010  ? BROW LIFT Bilateral 11/25/2019  ? Procedure: BROW PTOSIS REPAIR BILATERAL;  Surgeon: Karle Starch, MD;  Location: [place];  Service: Ophthalmology;  Laterality: Bilateral;  sleep apnea  ? CARDIAC CATHETERIZATION  06/24/2011  ? CARDIAC CATHETERIZATION N/A 01/18/2015  ? Procedure: Left Heart Cath with coronary angiography;  Surgeon: Minna Merritts, MD;  Location: [place] CV LAB;  Service: Cardiovascular;  Laterality: N/A;  ? CARDIAC CATHETERIZATION N/A 01/18/2015  ? Procedure: Intravascular Pressure Wire/FFR Study;  Surgeon: [name], MD;  Location: [place] CV LAB;  Service: Cardiovascular;  Laterality: N/A;  ? CAROTID STENT  03/10/2011  ? COLONOSCOPY  2010  ? COLONOSCOPY  06/14/2014  ? Dr Hilarie Fredrickson  ? CORONARY ARTERY BYPASS GRAFT N/A 01/24/2015  ? Procedure: CORONARY ARTERY BYPASS GRAFTING x 4 (LIMA-LAD, SVG-Int 1- Int 2, SVG-PD) ENDOSCOPIC GREATER SAPHENOUS VEIN HARVEST LEFT LEG;  Surgeon: [name], MD;  Location: [place];  Service:  Open Heart Surgery;  Laterality: N/A;  ? EMBOLECTOMY  06/15/2019  ? Procedure: EMBOLECTOMY;  Surgeon: Katha Cabal, MD;  Location: [facility];  Service: Vascular;;  right superficial femoral artery  ? ENDARTERECTOMY FEMORAL Right 06/15/2019  ? Procedure: ENDARTERECTOMY FEMORAL;  Surgeon: Katha Cabal, MD;  Location: [facility];  Service: Vascular;  Laterality: Right;  common femoral ?profunda femoris ?superficial femoral  ? ESOPHAGOGASTRODUODENOSCOPY (EGD) WITH PROPOFOL N/A 04/24/2016  ? Procedure: ESOPHAGOGASTRODUODENOSCOPY (EGD) WITH PROPOFOL;  Surgeon: Jerene Bears, MD;  [facility];  Service: Gastroenterology;  Laterality: N/A;  ? EYE SURGERY    ? lasik 15 yrs. ago, cataracts removed - both eyes   ? HAMMER TOE SURGERY    ? right toe  ? INSERTION OF ILIAC STENT Right 06/15/2019  ? Procedure: INSERTION OF ILIAC STENT ( STENTING OF SFA/POP ARTERY );  Surgeon: Katha Cabal, MD;  Location: [facility];  Service: Vascular;  Laterality: Right;  angioplpasty and stent placement: ?right superficial femoral ?right tibiopopliteal trunk ?bilateral common iliac arteries  ? IR IMAGING GUIDED PORT INSERTION  11/09/2020  ? LEFT HEART CATH AND CORONARY ANGIOGRAPHY Left 06/10/2017  ? Procedure: LEFT HEART CATH AND CORONARY ANGIOGRAPHY;  Surgeon: [name]  J, MD;  Location: [place] [facility];  Service: Cardiovascular;  Laterality: Left;  ? LOWER EXTREMITY ANGIOGRAPHY Left 01/04/2019  ? Procedure: LOWER EXTREMITY ANGIOGRAPHY;  Surgeon: Katha Cabal, MD;  Location: [place] [facility];  Service: Cardiovascular;  Laterality: Left;  ? LOWER EXTREMITY ANGIOGRAPHY Right 01/25/2019  ? Procedure: LOWER EXTREMITY ANGIOGRAPHY;  Surgeon: Katha Cabal, MD;  Location: [place] [facility];  Service: Cardiovascular;  Laterality: Right;  ? LOWER EXTREMITY ANGIOGRAPHY Right 01/15/2021  ? LOWER EXTREMITY ANGIOGRAPHY and stent placement to R SFA and popliteal artery (Schnier, Dolores Lory, MD)  ? NASAL SINUS  SURGERY  2008  ? septpolasty, bilateral turbinate reduction  ? SHOULDER ARTHROSCOPY  2012  ? TEE WITHOUT CARDIOVERSION N/A 01/24/2015  ? Procedure: TRANSESOPHAGEAL ECHOCARDIOGRAM (TEE);  Surgeon: [name], MD;

## 2021-09-19 NOTE — Patient Instructions (Signed)
[facility]  Discharge Instructions: °Thank you for choosing [facility] to provide your oncology and hematology care.  ° °If you have a lab appointment with the [facility], please go directly to the [facility] and check in at the registration area. °  °Wear comfortable clothing and clothing appropriate for easy access to any Portacath or PICC line.  ° °We strive to give you quality time with your provider. You may need to reschedule your appointment if you arrive late (15 or more minutes).  Arriving late affects you and other patients whose appointments are after yours.  Also, if you miss three or more appointments without notifying the office, you may be dismissed from the clinic at the provider’s discretion.    °  °For prescription refill requests, have your pharmacy contact our office and allow 72 hours for refills to be completed.   ° °Today you received the following chemotherapy and/or immunotherapy agents     °  °To help prevent nausea and vomiting after your treatment, we encourage you to take your nausea medication as directed. ° °BELOW ARE SYMPTOMS THAT SHOULD BE REPORTED IMMEDIATELY: °*FEVER GREATER THAN 100.4 F (38 °C) OR HIGHER °*CHILLS OR SWEATING °*NAUSEA AND VOMITING THAT IS NOT CONTROLLED WITH YOUR NAUSEA MEDICATION °*UNUSUAL SHORTNESS OF BREATH °*UNUSUAL BRUISING OR BLEEDING °*URINARY PROBLEMS (pain or burning when urinating, or frequent urination) °*BOWEL PROBLEMS (unusual diarrhea, constipation, pain near the anus) °TENDERNESS IN MOUTH AND THROAT WITH OR WITHOUT PRESENCE OF ULCERS (sore throat, sores in mouth, or a toothache) °UNUSUAL RASH, SWELLING OR PAIN  °UNUSUAL VAGINAL DISCHARGE OR ITCHING  ° °Items with * indicate a potential emergency and should be followed up as soon as possible or go to the Emergency Department if any problems should occur. ° °Please show the CHEMOTHERAPY ALERT CARD or IMMUNOTHERAPY ALERT CARD at check-in to the  Emergency Department and triage nurse. ° °Should you have questions after your visit or need to cancel or reschedule your appointment, please contact [facility]  Dept: [phone]  and follow the prompts.  Office hours are 8:00 a.m. to 4:30 p.m. Monday - Friday. Please note that voicemails left after 4:00 p.m. may not be returned until the following business day.  We are closed weekends and major holidays. You have access to a nurse at all times for urgent questions. Please call the main number to the clinic Dept: [phone] and follow the prompts. ° ° °For any non-urgent questions, you may also contact your provider using MyChart. We now offer e-Visits for anyone 18 and older to request care online for non-urgent symptoms. For details visit [email or web address]. °  °Also download the MyChart app! Go to the app store, search "MyChart", open the app, select [facility], and log in with your MyChart username and password. ° °Due to Covid, a mask is required upon entering the hospital/clinic. If you do not have a mask, one will be given to you upon arrival. For doctor visits, patients may have 1 support person aged 18 or older with them. For treatment visits, patients cannot have anyone with them due to current Covid guidelines and our immunocompromised population.  ° °

## 2021-09-19 NOTE — Progress Notes (Signed)
Patient here for follow up. Patient complains of being fatigue ?

## 2021-09-24 ENCOUNTER — Telehealth: Payer: Self-pay | Admitting: *Deleted

## 2021-09-24 NOTE — Telephone Encounter (Signed)
Patient accepts appointment for tomorrow at 9 AM for IV fluids. Appointment scheduled ?

## 2021-09-24 NOTE — Telephone Encounter (Signed)
Patient called reporting that he had treatment last week and he is feeling "real bad" and is asking if he can get fluids. Please advise ?

## 2021-09-25 ENCOUNTER — Inpatient Hospital Stay: Payer: PPO

## 2021-09-25 VITALS — BP 128/65 | HR 68 | Temp 98.1°F | Resp 18

## 2021-09-25 DIAGNOSIS — C3432 Malignant neoplasm of lower lobe, left bronchus or lung: Secondary | ICD-10-CM

## 2021-09-25 DIAGNOSIS — Z5112 Encounter for antineoplastic immunotherapy: Secondary | ICD-10-CM | POA: Diagnosis not present

## 2021-09-25 MED ORDER — HEPARIN SOD (PORK) LOCK FLUSH 100 UNIT/ML IV SOLN
500.0000 [IU] | Freq: Once | INTRAVENOUS | Status: AC | PRN
  Administered 2021-09-25: 500 [IU]
  Filled 2021-09-25: qty 5

## 2021-09-25 MED ORDER — SODIUM CHLORIDE 0.9% FLUSH
10.0000 mL | Freq: Once | INTRAVENOUS | Status: AC | PRN
  Administered 2021-09-25: 10 mL
  Filled 2021-09-25: qty 10

## 2021-09-25 MED ORDER — SODIUM CHLORIDE 0.9 % IV SOLN
Freq: Once | INTRAVENOUS | Status: AC
  Filled 2021-09-25: qty 250

## 2021-09-25 MED ORDER — DEXAMETHASONE SODIUM PHOSPHATE 10 MG/ML IJ SOLN
4.0000 mg | Freq: Once | INTRAMUSCULAR | Status: AC
  Administered 2021-09-25: 4 mg via INTRAVENOUS
  Filled 2021-09-25: qty 1

## 2021-09-25 NOTE — Progress Notes (Signed)
Patient has felt terrible since his last [place] treatment about a week ago. He has zero energy. Very low stamina. His legs give out on him, possible neuropathy in thighs. Has chronic right foot pain. Back has been hurting worse since treatment. Appetite is good. Bowels are normal. Reports sleeping well. Receiving IV hydration today + dexamethasone. ?

## 2021-09-26 ENCOUNTER — Ambulatory Visit (INDEPENDENT_AMBULATORY_CARE_PROVIDER_SITE_OTHER): Payer: PPO | Admitting: *Deleted

## 2021-09-26 DIAGNOSIS — Z0000 Encounter for general adult medical examination without abnormal findings: Secondary | ICD-10-CM

## 2021-09-26 NOTE — Progress Notes (Signed)
? ?Subjective:  ? Randall Ali. is a 81 y.o. male who presents for Medicare Annual/Subsequent preventive examination. ? ?I connected with  [name] on 09/26/21 by a telephone enabled telemedicine application and verified that I am speaking with the correct person using two identifiers. ?  ?I discussed the limitations of evaluation and management by telemedicine. The patient expressed understanding and agreed to proceed. ? ?Patient location: home ? ?Provider location: Tele-health not in office ? ? ?Review of Systems    ? ?Cardiac Risk Factors include: advanced age (>92men, >26 women);male gender;obesity (BMI >30kg/m2);family history of premature cardiovascular disease ? ?   ?Objective:  ?  ?Today's Vitals  ? 09/26/21 0931  ?PainSc: 5   ? ?There is no height or weight on file to calculate BMI. ? ? ?  09/26/2021  ?  9:32 AM 09/19/2021  ?  8:26 AM 09/05/2021  ?  8:31 AM 08/29/2021  ?  9:02 AM 08/15/2021  ?  9:32 AM 08/13/2021  ?  2:18 PM 08/01/2021  ? 10:55 AM  ?Advanced Directives  ?Does Patient Have a Medical Advance Directive?  Yes Yes Yes Yes Yes Yes  ?Type of Advance Directive Healthcare Power of Attorney  Living will;Healthcare Power of [place];Living will [place];Living will [place];Living will [place];Living will  ?Does patient want to make changes to medical advance directive?      No - Patient declined No - Patient declined  ?Copy of [place] in Chart? No - copy requested     No - copy requested   ? ? ?Current Medications (verified) ?Outpatient Encounter Medications as of 09/26/2021  ?Medication Sig  ? acetaminophen (TYLENOL) 325 MG tablet Take 650 mg by mouth as needed for moderate pain or mild pain.  ? albuterol (VENTOLIN HFA) 108 (90 Base) MCG/ACT inhaler INHALE 2 PUFFS INTO THE LUNGS EVERY 6 HOURS AS NEEDED FOR WHEEZING OR SHORTNESS OF BREATH  ? Ascorbic Acid (VITAMIN C) 1000 MG tablet  Take 1,000 mg by mouth daily.  ? aspirin 81 MG EC tablet Take 81 mg by mouth daily.    ? Calcium-Magnesium-Vitamin D (CALCIUM 1200+D3 PO) Take 1 tablet by mouth daily.  ? Carboxymethylcellul-Glycerin (LUBRICATING EYE DROPS OP) Place 1 drop into both eyes daily as needed (irritation).  ? Cholecalciferol (VITAMIN D3) 50 MCG (2000 UT) TABS Take 2,000 Units by mouth daily.  ? Cyanocobalamin (B-12) 5000 MCG CAPS Take 5,000 mcg by mouth daily.  ? docusate sodium (COLACE) 100 MG capsule Take 200 mg by mouth at bedtime.  ? fluticasone-salmeterol (ADVAIR HFA) 115-21 MCG/ACT inhaler Inhale 2 puffs into the lungs 2 (two) times daily.  ? lidocaine-prilocaine (EMLA) cream Apply 30 -45 mins prior to port access.  ? lisinopril (ZESTRIL) 5 MG tablet Take 1 tablet (5 mg total) by mouth daily.  ? Melatonin 10 MG CAPS Take 10 mg by mouth at bedtime as needed (sleep).  ? pantoprazole (PROTONIX) 40 MG tablet TAKE 1 TABLET BY MOUTH DAILY  ? prochlorperazine (COMPAZINE) 10 MG tablet Take 1 tablet (10 mg total) by mouth every 6 (six) hours as needed for nausea or vomiting.  ? simvastatin (ZOCOR) 40 MG tablet TAKE ONE TABLET BY MOUTH AT BEDTIME  ? traMADol (ULTRAM) 50 MG tablet TAKE 1 TABLET BY MOUTH 3 TIMES DAILY AS NEEDED FOR MODERATE PAIN  ? triamcinolone ointment (KENALOG) 0.5 %   ? Vitamin E 450 MG (1000 UT)  CAPS Take 450 Units by mouth daily.  ? Zinc 50 MG TABS Take 50 mg by mouth daily.  ? nitroGLYCERIN (NITROSTAT) 0.4 MG SL tablet Place 1 tablet (0.4 mg total) under the tongue every 5 (five) minutes as needed for up to 25 doses for chest pain. (Patient not taking: Reported on 09/05/2021)  ? predniSONE (DELTASONE) 20 MG tablet Take 3 tablets (60 mg total) by mouth daily with breakfast. 3 pills once a day with breakfast-do not stop until next visit/1 week (Patient not taking: Reported on 09/26/2021)  ? predniSONE (DELTASONE) 20 MG tablet Take 1 tablet (20 mg total) by mouth daily with breakfast. (Patient not taking: Reported on  09/26/2021)  ? ?Facility-Administered Encounter Medications as of 09/26/2021  ?Medication  ? heparin lock flush 100 UNIT/ML injection  ? ? ?Allergies (verified) ?Flomax [tamsulosin]  ? ?History: ?Past Medical History:  ?Diagnosis Date  ? Allergy   ? seasonal  ? Arthritis   ? all over- in general   ? CAD (coronary artery disease)   ? a. inferior wall MI 10/01 s/p PCI/DES to RCA; b. Myoview 3/16 neg for ischemia; c. LHC 8/16: ostLAD 80%, OM1 70%, OM2 70% x 2 lesions, mRCA 30%, dRCA 70% s/p 4-V CABG 01/24/15 (LIMA-LAD, VG- OM1, VG-OM2, VG-PDA)   ? Cancer [facility])   ? skin, melanoma  ? Carotid artery disease ([place])   ? a. [place] 8/16: 1-39% bilateral ICA stenosis  ? Cataract   ? removed  ? Diastolic dysfunction   ? a. TTE 8/16: EF 55-60%, no RWMA, Gr1DD, calcified mitral annulus, mild biatrial enlargement  ? Erectile dysfunction   ? GERD (gastroesophageal reflux disease)   ? History of elbow surgery   ? History of hiatal hernia   ? HLD (hyperlipidemia)   ? HTN (hypertension)   ? 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 stent RCA  ? Lung cancer ([place])   ? Postoperative wound infection 02/02/2015  ? Reflux esophagitis   ? Sleep apnea 2017  ? CPAP at night  ? ?Past Surgical History:  ?Procedure Laterality Date  ? arm surgery  2010  ? BROW LIFT Bilateral 11/25/2019  ? Procedure: BROW PTOSIS REPAIR BILATERAL;  Surgeon: Karle Starch, MD;  Location: [facility];  Service: Ophthalmology;  Laterality: Bilateral;  sleep apnea  ? CARDIAC CATHETERIZATION  06/24/2011  ? CARDIAC CATHETERIZATION N/A 01/18/2015  ? Procedure: Left Heart Cath with coronary angiography;  Surgeon: Minna Merritts, MD;  Location: [place] [facility];  Service: Cardiovascular;  Laterality: N/A;  ? CARDIAC CATHETERIZATION N/A 01/18/2015  ? Procedure: Intravascular Pressure Wire/FFR Study;  Surgeon: [name], MD;  Location: [place] [facility];  Service: Cardiovascular;  Laterality: N/A;  ? CAROTID STENT  03/10/2011  ? COLONOSCOPY  2010  ?  COLONOSCOPY  06/14/2014  ? Dr Hilarie Fredrickson  ? CORONARY ARTERY BYPASS GRAFT N/A 01/24/2015  ? Procedure: CORONARY ARTERY BYPASS GRAFTING x 4 (LIMA-LAD, SVG-Int 1- Int 2, SVG-PD) ENDOSCOPIC GREATER SAPHENOUS VEIN HARVEST LEFT LEG;  Surgeon: [name], MD;  Location: [facility];  Service: Open Heart Surgery;  Laterality: N/A;  ? EMBOLECTOMY  06/15/2019  ? Procedure: EMBOLECTOMY;  Surgeon: Katha Cabal, MD;  Location: [facility];  Service: Vascular;;  right superficial femoral artery  ? ENDARTERECTOMY FEMORAL Right 06/15/2019  ? Procedure: ENDARTERECTOMY FEMORAL;  Surgeon: Katha Cabal, MD;  Location: [facility];  Service: Vascular;  Laterality: Right;  common femoral ?profunda femoris ?superficial femoral  ? ESOPHAGOGASTRODUODENOSCOPY (EGD) WITH  PROPOFOL N/A 04/24/2016  ? Procedure: ESOPHAGOGASTRODUODENOSCOPY (EGD) WITH PROPOFOL;  Surgeon: Jerene Bears, MD;  Location: [facility];  Service: Gastroenterology;  Laterality: N/A;  ? EYE SURGERY    ? lasik 15 yrs. ago, cataracts removed - both eyes   ? HAMMER TOE SURGERY    ? right toe  ? INSERTION OF ILIAC STENT Right 06/15/2019  ? Procedure: INSERTION OF ILIAC STENT ( STENTING OF SFA/POP ARTERY );  Surgeon: Katha Cabal, MD;  Location: [facility];  Service: Vascular;  Laterality: Right;  angioplpasty and stent placement: ?right superficial femoral ?right tibiopopliteal trunk ?bilateral common iliac arteries  ? IR IMAGING GUIDED PORT INSERTION  11/09/2020  ? LEFT HEART CATH AND CORONARY ANGIOGRAPHY Left 06/10/2017  ? Procedure: LEFT HEART CATH AND CORONARY ANGIOGRAPHY;  Surgeon: Minna Merritts, MD;  Location: [place] [facility];  Service: Cardiovascular;  Laterality: Left;  ? LOWER EXTREMITY ANGIOGRAPHY Left 01/04/2019  ? Procedure: LOWER EXTREMITY ANGIOGRAPHY;  Surgeon: Katha Cabal, MD;  Location: [place] [facility];  Service: Cardiovascular;  Laterality: Left;  ? LOWER EXTREMITY ANGIOGRAPHY Right 01/25/2019  ? Procedure: LOWER EXTREMITY  ANGIOGRAPHY;  Surgeon: Katha Cabal, MD;  Location: [place] [facility];  Service: Cardiovascular;  Laterality: Right;  ? LOWER EXTREMITY ANGIOGRAPHY Right 01/15/2021  ? LOWER EXTREMITY ANGIOGRAPHY and stent placement

## 2021-09-26 NOTE — Patient Instructions (Signed)
Mr. Cornelio , ?Thank you for taking time to come for your Medicare Wellness Visit. I appreciate your ongoing commitment to your health goals. Please review the following plan we discussed and let me know if I can assist you in the future.  ? ?Screening recommendations/referrals: ?Colonoscopy: no longer required ?Recommended yearly ophthalmology/optometry visit for glaucoma screening and checkup ?Recommended yearly dental visit for hygiene and checkup ? ?Vaccinations: ?Influenza vaccine: up to date  ?Pneumococcal vaccine: up to date ?Tdap vaccine: up to date ?Shingles vaccine: up to date   ? ?Advanced directives: copy requested ? ?Conditions/risks identified:  ? ? ? ?Preventive Care 13 Years and Older, Male ?Preventive care refers to lifestyle choices and visits with your health care provider that can promote health and wellness. ?What does preventive care include? ?A yearly physical exam. This is also called an annual well check. ?Dental exams once or twice a year. ?Routine eye exams. Ask your health care provider how often you should have your eyes checked. ?Personal lifestyle choices, including: ?Daily care of your teeth and gums. ?Regular physical activity. ?Eating a healthy diet. ?Avoiding tobacco and drug use. ?Limiting alcohol use. ?Practicing safe sex. ?Taking low doses of aspirin every day. ?Taking vitamin and mineral supplements as recommended by your health care provider. ?What happens during an annual well check? ?The services and screenings done by your health care provider during your annual well check will depend on your age, overall health, lifestyle risk factors, and family history of disease. ?Counseling  ?Your health care provider may ask you questions about your: ?Alcohol use. ?Tobacco use. ?Drug use. ?Emotional well-being. ?Home and relationship well-being. ?Sexual activity. ?Eating habits. ?History of falls. ?Memory and ability to understand (cognition). ?Work and work Statistician. ?Screening   ?You may have the following tests or measurements: ?Height, weight, and BMI. ?Blood pressure. ?Lipid and cholesterol levels. These may be checked every 5 years, or more frequently if you are over 32 years old. ?Skin check. ?Lung cancer screening. You may have this screening every year starting at age 51 if you have a 30-pack-year history of smoking and currently smoke or have quit within the past 15 years. ?Fecal occult blood test (FOBT) of the stool. You may have this test every year starting at age 83. ?Flexible sigmoidoscopy or colonoscopy. You may have a sigmoidoscopy every 5 years or a colonoscopy every 10 years starting at age 15. ?Prostate cancer screening. Recommendations will vary depending on your family history and other risks. ?Hepatitis C blood test. ?Hepatitis B blood test. ?Sexually transmitted disease (STD) testing. ?Diabetes screening. This is done by checking your blood sugar (glucose) after you have not eaten for a while (fasting). You may have this done every 1-3 years. ?Abdominal aortic aneurysm (AAA) screening. You may need this if you are a current or former smoker. ?Osteoporosis. You may be screened starting at age 52 if you are at high risk. ?Talk with your health care provider about your test results, treatment options, and if necessary, the need for more tests. ?Vaccines  ?Your health care provider may recommend certain vaccines, such as: ?Influenza vaccine. This is recommended every year. ?Tetanus, diphtheria, and acellular pertussis (Tdap, Td) vaccine. You may need a Td booster every 10 years. ?Zoster vaccine. You may need this after age 23. ?Pneumococcal 13-valent conjugate (PCV13) vaccine. One dose is recommended after age 90. ?Pneumococcal polysaccharide (PPSV23) vaccine. One dose is recommended after age 56. ?Talk to your health care provider about which screenings and vaccines you need and  how often you need them. ?This information is not intended to replace advice given to you by  your health care provider. Make sure you discuss any questions you have with your health care provider. ?Document Released: 06/22/2015 Document Revised: 02/13/2016 Document Reviewed: 03/27/2015 ?Elsevier Interactive Patient Education ? 2017 [place]. ? ?Fall Prevention in the Home ?Falls can cause injuries. They can happen to people of all ages. There are many things you can do to make your home safe and to help prevent falls. ?What can I do on the outside of my home? ?Regularly fix the edges of walkways and driveways and fix any cracks. ?Remove anything that might make you trip as you walk through a door, such as a raised step or threshold. ?Trim any bushes or trees on the path to your home. ?Use bright outdoor lighting. ?Clear any walking paths of anything that might make someone trip, such as rocks or tools. ?Regularly check to see if handrails are loose or broken. Make sure that both sides of any steps have handrails. ?Any raised decks and porches should have guardrails on the edges. ?Have any leaves, snow, or ice cleared regularly. ?Use sand or salt on walking paths during winter. ?Clean up any spills in your garage right away. This includes oil or grease spills. ?What can I do in the bathroom? ?Use night lights. ?Install grab bars by the toilet and in the tub and shower. Do not use towel bars as grab bars. ?Use non-skid mats or decals in the tub or shower. ?If you need to sit down in the shower, use a plastic, non-slip stool. ?Keep the floor dry. Clean up any water that spills on the floor as soon as it happens. ?Remove soap buildup in the tub or shower regularly. ?Attach bath mats securely with double-sided non-slip rug tape. ?Do not have throw rugs and other things on the floor that can make you trip. ?What can I do in the bedroom? ?Use night lights. ?Make sure that you have a light by your bed that is easy to reach. ?Do not use any sheets or blankets that are too big for your bed. They should not hang  down onto the floor. ?Have a firm chair that has side arms. You can use this for support while you get dressed. ?Do not have throw rugs and other things on the floor that can make you trip. ?What can I do in the kitchen? ?Clean up any spills right away. ?Avoid walking on wet floors. ?Keep items that you use a lot in easy-to-reach places. ?If you need to reach something above you, use a strong step stool that has a grab bar. ?Keep electrical cords out of the way. ?Do not use floor polish or wax that makes floors slippery. If you must use wax, use non-skid floor wax. ?Do not have throw rugs and other things on the floor that can make you trip. ?What can I do with my stairs? ?Do not leave any items on the stairs. ?Make sure that there are handrails on both sides of the stairs and use them. Fix handrails that are broken or loose. Make sure that handrails are as long as the stairways. ?Check any carpeting to make sure that it is firmly attached to the stairs. Fix any carpet that is loose or worn. ?Avoid having throw rugs at the top or bottom of the stairs. If you do have throw rugs, attach them to the floor with carpet tape. ?Make sure that you have  a light switch at the top of the stairs and the bottom of the stairs. If you do not have them, ask someone to add them for you. ?What else can I do to help prevent falls? ?Wear shoes that: ?Do not have high heels. ?Have rubber bottoms. ?Are comfortable and fit you well. ?Are closed at the toe. Do not wear sandals. ?If you use a stepladder: ?Make sure that it is fully opened. Do not climb a closed stepladder. ?Make sure that both sides of the stepladder are locked into place. ?Ask someone to hold it for you, if possible. ?Clearly mark and make sure that you can see: ?Any grab bars or handrails. ?First and last steps. ?Where the edge of each step is. ?Use tools that help you move around (mobility aids) if they are needed. These  include: ?Canes. ?Walkers. ?Scooters. ?Crutches. ?Turn on the lights when you go into a dark area. Replace any light bulbs as soon as they burn out. ?Set up your furniture so you have a clear path. Avoid moving your furniture around. ?If any of your floo

## 2021-09-27 ENCOUNTER — Telehealth: Payer: Self-pay | Admitting: Internal Medicine

## 2021-09-27 DIAGNOSIS — M9903 Segmental and somatic dysfunction of lumbar region: Secondary | ICD-10-CM | POA: Diagnosis not present

## 2021-09-27 DIAGNOSIS — M62830 Muscle spasm of back: Secondary | ICD-10-CM | POA: Diagnosis not present

## 2021-09-27 DIAGNOSIS — M955 Acquired deformity of pelvis: Secondary | ICD-10-CM | POA: Diagnosis not present

## 2021-09-27 DIAGNOSIS — M9905 Segmental and somatic dysfunction of pelvic region: Secondary | ICD-10-CM | POA: Diagnosis not present

## 2021-09-27 NOTE — Telephone Encounter (Signed)
Called pt to notify him of new appt date and times ?

## 2021-10-04 DIAGNOSIS — M62830 Muscle spasm of back: Secondary | ICD-10-CM | POA: Diagnosis not present

## 2021-10-04 DIAGNOSIS — M9905 Segmental and somatic dysfunction of pelvic region: Secondary | ICD-10-CM | POA: Diagnosis not present

## 2021-10-04 DIAGNOSIS — M955 Acquired deformity of pelvis: Secondary | ICD-10-CM | POA: Diagnosis not present

## 2021-10-04 DIAGNOSIS — M9903 Segmental and somatic dysfunction of lumbar region: Secondary | ICD-10-CM | POA: Diagnosis not present

## 2021-10-10 ENCOUNTER — Ambulatory Visit: Payer: PPO | Admitting: Internal Medicine

## 2021-10-10 ENCOUNTER — Ambulatory Visit: Payer: PPO

## 2021-10-10 ENCOUNTER — Other Ambulatory Visit: Payer: PPO

## 2021-10-15 ENCOUNTER — Other Ambulatory Visit: Payer: Self-pay | Admitting: Family Medicine

## 2021-10-15 NOTE — Telephone Encounter (Signed)
Refill request Tramadol ?Last refill 09/09/21 #90 ?Last office visit 07/15/21 ?No upcoming appointment scheduled ?

## 2021-10-15 NOTE — Telephone Encounter (Signed)
ERx 

## 2021-10-16 NOTE — Progress Notes (Signed)
Cardiology Office Note ? ?Date:  10/21/2021  ? ?ID:  Randall Lofts., DOB November 10, 1940, MRN [id number] ? ?PCP:  Ria Bush, MD  ? ?Chief Complaint  ?Patient presents with  ? 6 month follow up   ?  Patient was diagnosed with lung cancer 1 year ago, was taking chemo and radiation and is now having infusions. Patient c/o shortness of breath with walking a short distance. Medications reviewed by the patient verbally.    ? ? ?HPI:  ?[name] is a very pleasant 81 year old gentleman with a history of  ?coronary artery disease, ?inferior wall MI 2001 with stent to the RCA,    ?hypertension,  ?hyperlipidemia.  ?Previous admit in 2011 for mild chest pain and sweating.  ?Stress test showed no ischemia with inferior scar.  ?Previous cardiac catheterization showing moderate OM disease.  ?Repeat catheterization  in August 2016 showing severe three-vessel disease,  ?sent for CABG on 01/24/2015.  ?He presents today for follow-up of his coronary artery disease ? ?Last seen by myself in clinic March 2021 ?Seen by one of our providers November 2022 ? ?diagnosed with non-small cell lung cancer, stage IV in 10/2020.  ? treated with CarboTaxol plus Keytruda  ?Recent scan with "Infiltrative opacity in the superior segment of left lower lobe" ?On prednisone and chemo ? ?On discussion of recent chemo treatments, reports that he feels bad after chemo treatment, has symptoms of dry heaves, ?Sits in recliner , takes 2 days to recover ? ?Legs give out more he has noticed ?Has to stop at times to recover ?Seeing back doc tomorrow ? ?Still goes to gym, daily, weights, aerobic ?"I do it all" when he has the energy, typically works out in the mornings ? ?Weight stable past 6 months, weight 217 pounds ? ?"Heart feels ok", no angina, denies significant shortness of breath on exertion ? ?Echo 05/2021: EF 50 to 55% ? ?EKG personally reviewed by myself on todays visit ?Nsr rate 75 rare PVCs ? ?Other past medical hx reviewed ?LE PCI followed by   ?common femoral endarterectomy extending into the profunda femoris and the SFA. ?-- Angioplasty to the right tibioperoneal trunk with 5 mm diameter Lutonix drug-coated angioplasty balloon ?4.  Stent placement to the right tibioperoneal artery with 6 mm diameter by 4 cm length life stent ?5.  Stent placement to the right SFA and popliteal artery x2 with 7 mm diameter by 20 cm length and 7 mm diameter by 15 cm length life stent postdilated with 7 mm diameter with Lutonix drug-coated angioplasty balloons ?6.  Fogarty embolectomy balloon to the right SFA and popliteal arteries for thrombus after above procedures ? ?Cardiac catheterization 06/10/2017 ?Severe three vessel disease, occluded SVG to the RCA, ?Patent SVG to OM 1 and OM2, patent LIMA to the LAD ?Will try medical management first ?Add plavix, nitrates ? intervention would require atherectomy in [name] ? ?GERD sx improved on PPI ?EGD  07/2014 showing moderate to severe esophagitis ? ?postoperative atrial fibrillation,  postoperative delirium and began having fevers on 01/29/2015.  some drainage from the inferior portion of his sternal wound. Blood cultures and wound cultures grew Enterobacter aerogenes. He was discharged on IV ceftriaxone  and completed 6 weeks of therapy . He took probiotics during this time ?  ?PMH:   has a past medical history of Allergy, Arthritis, CAD (coronary artery disease), Cancer ([place]), Carotid artery disease ([place]), Cataract, Diastolic dysfunction, Erectile dysfunction, GERD (gastroesophageal reflux disease), History of elbow surgery, History of hiatal hernia, HLD (  hyperlipidemia), HTN (hypertension), Inferior myocardial infarction ([place]) (03/2000), Lung cancer ([place]), Postoperative wound infection (02/02/2015), Reflux esophagitis, and Sleep apnea (2017). ? ?PSH:    ?Past Surgical History:  ?Procedure Laterality Date  ? arm surgery  2010  ? BROW LIFT Bilateral 11/25/2019  ? Procedure: BROW PTOSIS REPAIR BILATERAL;  Surgeon: Karle Starch, MD;  Location: [place];  Service: Ophthalmology;  Laterality: Bilateral;  sleep apnea  ? CARDIAC CATHETERIZATION  06/24/2011  ? CARDIAC CATHETERIZATION N/A 01/18/2015  ? Procedure: Left Heart Cath with coronary angiography;  Surgeon: Minna Merritts, MD;  Location: [place] [facility];  Service: Cardiovascular;  Laterality: N/A;  ? CARDIAC CATHETERIZATION N/A 01/18/2015  ? Procedure: Intravascular Pressure Wire/FFR Study;  Surgeon: [name], MD;  Location: [place] [facility];  Service: Cardiovascular;  Laterality: N/A;  ? CAROTID STENT  03/10/2011  ? COLONOSCOPY  2010  ? COLONOSCOPY  06/14/2014  ? Dr Hilarie Fredrickson  ? CORONARY ARTERY BYPASS GRAFT N/A 01/24/2015  ? Procedure: CORONARY ARTERY BYPASS GRAFTING x 4 (LIMA-LAD, SVG-Int 1- Int 2, SVG-PD) ENDOSCOPIC GREATER SAPHENOUS VEIN HARVEST LEFT LEG;  Surgeon: [name], MD;  Location: [place];  Service: Open Heart Surgery;  Laterality: N/A;  ? EMBOLECTOMY  06/15/2019  ? Procedure: EMBOLECTOMY;  Surgeon: Katha Cabal, MD;  Location: [facility];  Service: Vascular;;  right superficial femoral artery  ? ENDARTERECTOMY FEMORAL Right 06/15/2019  ? Procedure: ENDARTERECTOMY FEMORAL;  Surgeon: Katha Cabal, MD;  Location: [facility];  Service: Vascular;  Laterality: Right;  common femoral ?profunda femoris ?superficial femoral  ? ESOPHAGOGASTRODUODENOSCOPY (EGD) WITH PROPOFOL N/A 04/24/2016  ? Procedure: ESOPHAGOGASTRODUODENOSCOPY (EGD) WITH PROPOFOL;  Surgeon: Jerene Bears, MD;  [facility];  Service: Gastroenterology;  Laterality: N/A;  ? EYE SURGERY    ? lasik 15 yrs. ago, cataracts removed - both eyes   ? HAMMER TOE SURGERY    ? right toe  ? INSERTION OF ILIAC STENT Right 06/15/2019  ? Procedure: INSERTION OF ILIAC STENT ( STENTING OF SFA/POP ARTERY );  Surgeon: Katha Cabal, MD;  Location: [facility];  Service: Vascular;  Laterality: Right;  angioplpasty and stent placement: ?right superficial femoral ?right  tibiopopliteal trunk ?bilateral common iliac arteries  ? IR IMAGING GUIDED PORT INSERTION  11/09/2020  ? LEFT HEART CATH AND CORONARY ANGIOGRAPHY Left 06/10/2017  ? Procedure: LEFT HEART CATH AND CORONARY ANGIOGRAPHY;  Surgeon: Minna Merritts, MD;  Location: [place] [facility];  Service: Cardiovascular;  Laterality: Left;  ? LOWER EXTREMITY ANGIOGRAPHY Left 01/04/2019  ? Procedure: LOWER EXTREMITY ANGIOGRAPHY;  Surgeon: Katha Cabal, MD;  Location: [place] [facility];  Service: Cardiovascular;  Laterality: Left;  ? LOWER EXTREMITY ANGIOGRAPHY Right 01/25/2019  ? Procedure: LOWER EXTREMITY ANGIOGRAPHY;  Surgeon: Katha Cabal, MD;  Location: [place] [facility];  Service: Cardiovascular;  Laterality: Right;  ? LOWER EXTREMITY ANGIOGRAPHY Right 01/15/2021  ? LOWER EXTREMITY ANGIOGRAPHY and stent placement to R SFA and popliteal artery (Schnier, Dolores Lory, MD)  ? NASAL SINUS SURGERY  2008  ? septpolasty, bilateral turbinate reduction  ? SHOULDER ARTHROSCOPY  2012  ? TEE WITHOUT CARDIOVERSION N/A 01/24/2015  ? Procedure: TRANSESOPHAGEAL ECHOCARDIOGRAM (TEE);  Surgeon: [name], MD;  Location: [place];  Service: Open Heart Surgery;  Laterality: N/A;  ? TOE SURGERY  1994  ? UPPER GI ENDOSCOPY  07/2014, 04-24-16  ? Dr Raquel James  ? WRIST SURGERY  2011  ? ? ?Current Outpatient Medications  ?Medication  Sig Dispense Refill  ? acetaminophen (TYLENOL) 325 MG tablet Take 650 mg by mouth as needed for moderate pain or mild pain.    ? albuterol (VENTOLIN HFA) 108 (90 Base) MCG/ACT inhaler INHALE 2 PUFFS INTO THE LUNGS EVERY 6 HOURS AS NEEDED FOR WHEEZING OR SHORTNESS OF BREATH 8.5 g 2  ? Ascorbic Acid (VITAMIN C) 1000 MG tablet Take 1,000 mg by mouth daily.    ? aspirin 81 MG EC tablet Take 81 mg by mouth daily.      ? Calcium-Magnesium-Vitamin D (CALCIUM 1200+D3 PO) Take 1 tablet by mouth daily.    ? Carboxymethylcellul-Glycerin (LUBRICATING EYE DROPS OP) Place 1 drop into both eyes daily as needed  (irritation).    ? Cholecalciferol (VITAMIN D3) 50 MCG (2000 UT) TABS Take 2,000 Units by mouth daily.    ? Cyanocobalamin (B-12) 5000 MCG CAPS Take 5,000 mcg by mouth daily.    ? docusate sodium (COLACE) 100 MG capsule T

## 2021-10-17 ENCOUNTER — Inpatient Hospital Stay (HOSPITAL_BASED_OUTPATIENT_CLINIC_OR_DEPARTMENT_OTHER): Payer: PPO | Admitting: Internal Medicine

## 2021-10-17 ENCOUNTER — Ambulatory Visit
Admission: RE | Admit: 2021-10-17 | Discharge: 2021-10-17 | Disposition: A | Discharge status: Home / Self Care | Payer: PPO | Attending: Internal Medicine | Admitting: Internal Medicine

## 2021-10-17 ENCOUNTER — Inpatient Hospital Stay: Payer: PPO

## 2021-10-17 ENCOUNTER — Ambulatory Visit
Admission: RE | Admit: 2021-10-17 | Discharge: 2021-10-17 | Disposition: A | Discharge status: Home / Self Care | Payer: PPO | Source: Ambulatory Visit | Attending: Internal Medicine | Admitting: Internal Medicine

## 2021-10-17 ENCOUNTER — Inpatient Hospital Stay: Payer: PPO | Attending: Internal Medicine

## 2021-10-17 ENCOUNTER — Encounter: Payer: Self-pay | Admitting: Internal Medicine

## 2021-10-17 VITALS — BP 124/75 | HR 71 | Resp 16

## 2021-10-17 DIAGNOSIS — Z87891 Personal history of nicotine dependence: Secondary | ICD-10-CM | POA: Diagnosis not present

## 2021-10-17 DIAGNOSIS — Z8774 Personal history of (corrected) congenital malformations of heart and circulatory system: Secondary | ICD-10-CM | POA: Diagnosis not present

## 2021-10-17 DIAGNOSIS — Z79899 Other long term (current) drug therapy: Secondary | ICD-10-CM | POA: Diagnosis not present

## 2021-10-17 DIAGNOSIS — C3432 Malignant neoplasm of lower lobe, left bronchus or lung: Secondary | ICD-10-CM

## 2021-10-17 DIAGNOSIS — M160 Bilateral primary osteoarthritis of hip: Secondary | ICD-10-CM | POA: Diagnosis not present

## 2021-10-17 DIAGNOSIS — Z85820 Personal history of malignant melanoma of skin: Secondary | ICD-10-CM | POA: Diagnosis not present

## 2021-10-17 DIAGNOSIS — Z7951 Long term (current) use of inhaled steroids: Secondary | ICD-10-CM | POA: Diagnosis not present

## 2021-10-17 DIAGNOSIS — I2510 Atherosclerotic heart disease of native coronary artery without angina pectoris: Secondary | ICD-10-CM | POA: Diagnosis not present

## 2021-10-17 DIAGNOSIS — G8929 Other chronic pain: Secondary | ICD-10-CM | POA: Insufficient documentation

## 2021-10-17 DIAGNOSIS — Z7902 Long term (current) use of antithrombotics/antiplatelets: Secondary | ICD-10-CM | POA: Insufficient documentation

## 2021-10-17 DIAGNOSIS — Z5112 Encounter for antineoplastic immunotherapy: Secondary | ICD-10-CM | POA: Insufficient documentation

## 2021-10-17 DIAGNOSIS — M549 Dorsalgia, unspecified: Secondary | ICD-10-CM | POA: Insufficient documentation

## 2021-10-17 DIAGNOSIS — Z7982 Long term (current) use of aspirin: Secondary | ICD-10-CM | POA: Insufficient documentation

## 2021-10-17 LAB — CBC WITH DIFFERENTIAL/PLATELET
Abs Immature Granulocytes: 0.1 10*3/uL — ABNORMAL HIGH (ref 0.00–0.07)
Basophils Absolute: 0.1 10*3/uL (ref 0.0–0.1)
Basophils Relative: 1 %
Eosinophils Absolute: 0.3 10*3/uL (ref 0.0–0.5)
Eosinophils Relative: 4 %
HCT: 40.8 % (ref 39.0–52.0)
Hemoglobin: 13.7 g/dL (ref 13.0–17.0)
Immature Granulocytes: 1 %
Lymphocytes Relative: 18 %
Lymphs Abs: 1.5 10*3/uL (ref 0.7–4.0)
MCH: 31.4 pg (ref 26.0–34.0)
MCHC: 33.6 g/dL (ref 30.0–36.0)
MCV: 93.4 fL (ref 80.0–100.0)
Monocytes Absolute: 0.8 10*3/uL (ref 0.1–1.0)
Monocytes Relative: 10 %
Neutro Abs: 5.6 10*3/uL (ref 1.7–7.7)
Neutrophils Relative %: 66 %
Platelets: 246 10*3/uL (ref 150–400)
RBC: 4.37 MIL/uL (ref 4.22–5.81)
RDW: 13.5 % (ref 11.5–15.5)
WBC: 8.4 10*3/uL (ref 4.0–10.5)
nRBC: 0 % (ref 0.0–0.2)

## 2021-10-17 LAB — COMPREHENSIVE METABOLIC PANEL
ALT: 18 U/L (ref 0–44)
AST: 19 U/L (ref 15–41)
Albumin: 4 g/dL (ref 3.5–5.0)
Alkaline Phosphatase: 54 U/L (ref 38–126)
Anion gap: 6 (ref 5–15)
BUN: 26 mg/dL — ABNORMAL HIGH (ref 8–23)
CO2: 23 mmol/L (ref 22–32)
Calcium: 8.9 mg/dL (ref 8.9–10.3)
Chloride: 106 mmol/L (ref 98–111)
Creatinine, Ser: 1.06 mg/dL (ref 0.61–1.24)
GFR, Estimated: >60 mL/min (ref >60)
Glucose, Bld: 108 mg/dL — ABNORMAL HIGH (ref 70–99)
Potassium: 4.2 mmol/L (ref 3.5–5.1)
Sodium: 135 mmol/L (ref 135–145)
Total Bilirubin: 0.8 mg/dL (ref 0.3–1.2)
Total Protein: 6.7 g/dL (ref 6.5–8.1)

## 2021-10-17 MED ORDER — HEPARIN SOD (PORK) LOCK FLUSH 100 UNIT/ML IV SOLN
500.0000 [IU] | Freq: Once | INTRAVENOUS | Status: AC | PRN
  Administered 2021-10-17: 500 [IU]
  Filled 2021-10-17: qty 5

## 2021-10-17 MED ORDER — HEPARIN SOD (PORK) LOCK FLUSH 100 UNIT/ML IV SOLN
500.0000 [IU] | Freq: Once | INTRAVENOUS | Status: AC
  Filled 2021-10-17: qty 5

## 2021-10-17 MED ORDER — SODIUM CHLORIDE 0.9% FLUSH
10.0000 mL | Freq: Once | INTRAVENOUS | Status: AC
  Administered 2021-10-17: 10 mL via INTRAVENOUS
  Filled 2021-10-17: qty 10

## 2021-10-17 MED ORDER — SODIUM CHLORIDE 0.9 % IV SOLN
200.0000 mg | Freq: Once | INTRAVENOUS | Status: AC
  Administered 2021-10-17: 200 mg via INTRAVENOUS
  Filled 2021-10-17: qty 8

## 2021-10-17 MED ORDER — SODIUM CHLORIDE 0.9 % IV SOLN
Freq: Once | INTRAVENOUS | Status: AC
  Filled 2021-10-17: qty 250

## 2021-10-17 NOTE — Assessment & Plan Note (Addendum)
#   Lung cancer-non-small cell stage IV [right suprarenal nodule-s/p biopsy "non-small cell ca"].  S/p  CarboTaxol plus Keytruda x4; currently on single agent Keytruda-  CT FEB 13th, 2023- Interval decrease in size of the posterior left lower lobe pulmonary lesion;  Interval decrease in size of the right suprarenal nodule; however focal left lower lobe new nodule noted [see below].? Clinically stable. ? ?#Continue Keytruda at this time. Labs today reviewed;  acceptable for treatment today.  We will plan to get imaging in about 3 weeks.  Ordered. ? ?# Acute on chronic back pain- BIL radiating lower extremities.  Recommend bilateral hip x-rays.  If unrevealing consider bone scan.  Also await evaluation with  MD appt in [place] next week. - on tramadol 50 mg BID prn.  ? ?# Fatigue: Likely secondary to immunotherapy no endocrine dysfunction noted.  On prendisone/intermittent dexamethasone. ? ?# CAD-s/p CABG-/PVD-s/p stent [Dr.Schneir]-improvement in pain noted.  On Plavix --STABLE; ? ?# Memory issues: Brain MRI- Sep 2022- WNL.  If worse would recommend MRI brain. ? ?# IV access: Mediport functioning. ? ?# DISPOSITION: ?# Keytruda today;   IVFs 1lit; ONLY Dex 4 mg IVP today  ? ?# 1 week-labs- cbc/bmp possible   IVfs 1lit/dex ? ?# Follow up in 3 weeks- MD; labs- cbc/cmp;Keytruda;-CT CAP prior- Dr.B ? ?# I reviewed the blood work- with the patient in detail; also reviewed the imaging independently [as summarized above]; and with the patient in detail.  ? ?

## 2021-10-17 NOTE — Progress Notes (Signed)
Nutrition Assessment: ? ?Patient identified on Malnutrition Screening report for weight loss, poor appetite ? ?Patient with stage IV non small cell lung cancer. Patient receiving [place].   ? ?Met with patient during infusion.  Patient reports that his appetite is good while being on steroids.  "I have gained weight. Nothing is wrong with my appetite."  Says that he is eating 3 meals a day.  Usually eggs and sausage or bacon 1-2 times per week for breakfast.  Lunch and supper usually meat and couple sides.  Primarily goes out to eat.   ? ?Medications: predinsone, zofran, protonix ? ?Labs: reviewed ? ?Anthropometrics:  ? ?Height: 68 inches ?Weight: 217 lb 3.2 oz ?Relatively stable since Oct 2022.  Patient says he has gained weight ?BMI: 33 ? ? ?NUTRITION DIAGNOSIS: none at this time ? ? ?INTERVENTION:  ?Encouraged well balanced diet including good sources of protein and plant foods.  ?RD available if needed ? ? ? ?NEXT VISIT: no follow-up ? ? ?Arias Weinert B. Zenia Resides, RD, LDN ?Registered Dietitian ?[phone] ? ? ?

## 2021-10-17 NOTE — Progress Notes (Signed)
C/o back pain 9/10 scale. Has tramadol for pain and helps some. Do you have any recommendations? ?

## 2021-10-17 NOTE — Patient Instructions (Signed)
#   hip X-rays today.  ?

## 2021-10-17 NOTE — Progress Notes (Signed)
[name] ?CONSULT NOTE ? ?Patient Care Team: ?Ria Bush, MD as PCP - General (Family Medicine) ?Minna Merritts, MD as PCP - Cardiology (Cardiology) ?[name], MD (Internal Medicine) ?Minna Merritts, MD as Consulting Physician (Cardiology) ?Pieter Partridge, DO as Consulting Physician (Neurology) ?Cammie Sickle, MD as Consulting Physician (Hematology and Oncology) ?Telford Nab, RN as Sales executive ? ?CHIEF COMPLAINTS/PURPOSE OF CONSULTATION: lung cancer ? ? ? ?Oncology History Overview Note  ?#MAY 2022-Lung cancer-non-small cell [CT guided bx] T3N1 vs stage IV Dr.Hendrickson.  MRI brain negative for malignancy.# 1. April 2022- LLL ~4.0 cm mass in the superior segment left lower lobe abuts the major fissure and the posterior pleural surface without visible chest wall invasion without pleural effusion. There 2-3 other small nodules in the left lower lobe, largest measures 9 mm in diameter. These are suspicious for same lobe satellite lesions and there is left hilar adenopathy. Assuming non-small cell lung cancer the appearance is compatible with T3 N1 M0 disease (stage IIIA). ? ?# right suprarenal nodule-awaiting biopsy on 5/31- non-small cell [revived at tumor conference at [place] ? ?# June 9th 2022- CARBO-TAXOl-KEYTRUDA; Fulphila ? ?# ? Subtle adrenal insufficiency- No acute process - -September MRI brain negative for any pituitary hypophysitis/brain metastasis. Prednisone  ? ?MOLECULAR TESTING: NGS TPS-PDL 100%; EXON 12 amplification* mutations. ? ? ? ?# CAD [CABG 2016; Dr.Gollan] ?  ?Cancer of lower lobe of left lung ([place])  ?11/02/2020 Initial Diagnosis  ? Cancer of lower lobe of left lung ([place]) ?  ?11/02/2020 Cancer Staging  ? Staging form: Lung, AJCC 8th Edition ?- Clinical: Stage IVA (cT3, cN1, cM1a) - Signed by Cammie Sickle, MD on 11/02/2020 ? ?  ?11/16/2020 -  Chemotherapy  ? Patient is on Treatment Plan : LUNG NSCLC Carboplatin + Paclitaxel +  Pembrolizumab q21d x 4 cycles / Pembrolizumab Maintenance Q21D  ? ?   ? ? ? ?HISTORY OF PRESENTING ILLNESS: Alone. walking independently. ? ?Randall Ali. 81 y.o.  male lung cancer-non-small cell stage IV [supra-renal nodule s/p biopsy] currently on Keytruda is for follow-up. ? ?Patient complains of worsening back pain especially in his left hip radiating to bilateral extremities.  States his pain is brought on by movement.  He is limiting his ability to walk.  Denies any bladder or bowel incontinence.  Denies any tingling or numbness in the extremities. ? ?No nausea no vomiting no headaches. ? ?Review of Systems  ?Constitutional:  Positive for malaise/fatigue. Negative for chills, diaphoresis, fever and weight loss.  ?HENT:  Negative for nosebleeds and sore throat.   ?Eyes:  Negative for double vision.  ?Respiratory:  Negative for cough, hemoptysis, sputum production, shortness of breath and wheezing.   ?Cardiovascular:  Negative for chest pain, palpitations, orthopnea and leg swelling.  ?Gastrointestinal:  Positive for nausea. Negative for abdominal pain, blood in stool, constipation, diarrhea, heartburn, melena and vomiting.  ?Genitourinary:  Negative for dysuria, frequency and urgency.  ?Musculoskeletal:  Positive for back pain and joint pain.  ?Skin: Negative.  Negative for itching and rash.  ?Neurological:  Negative for dizziness, tingling, focal weakness, weakness and headaches.  ?Endo/Heme/Allergies:  Does not bruise/bleed easily.  ?Psychiatric/Behavioral:  Negative for depression. The patient is not nervous/anxious and does not have insomnia.    ? ?MEDICAL HISTORY:  ?Past Medical History:  ?Diagnosis Date  ? Allergy   ? seasonal  ? Arthritis   ? all over- in general   ? CAD (coronary artery disease)   ?  a. inferior wall MI 10/01 s/p PCI/DES to RCA; b. Myoview 3/16 neg for ischemia; c. LHC 8/16: ostLAD 80%, OM1 70%, OM2 70% x 2 lesions, mRCA 30%, dRCA 70% s/p 4-V CABG 01/24/15 (LIMA-LAD, VG- OM1,  VG-OM2, VG-PDA)   ? Cancer [facility])   ? skin, melanoma  ? Carotid artery disease ([place])   ? a. [place] 8/16: 1-39% bilateral ICA stenosis  ? Cataract   ? removed  ? Diastolic dysfunction   ? a. TTE 8/16: EF 55-60%, no RWMA, Gr1DD, calcified mitral annulus, mild biatrial enlargement  ? Erectile dysfunction   ? GERD (gastroesophageal reflux disease)   ? History of elbow surgery   ? History of hiatal hernia   ? HLD (hyperlipidemia)   ? HTN (hypertension)   ? Inferior myocardial infarction [facility]) 03/2000  ? stent RCA  ? Lung cancer ([place])   ? Postoperative wound infection 02/02/2015  ? Reflux esophagitis   ? Sleep apnea 2017  ? CPAP at night  ? ? ?SURGICAL HISTORY: ?Past Surgical History:  ?Procedure Laterality Date  ? arm surgery  2010  ? BROW LIFT Bilateral 11/25/2019  ? Procedure: BROW PTOSIS REPAIR BILATERAL;  Surgeon: Karle Starch, MD;  Location: [place];  Service: Ophthalmology;  Laterality: Bilateral;  sleep apnea  ? CARDIAC CATHETERIZATION  06/24/2011  ? CARDIAC CATHETERIZATION N/A 01/18/2015  ? Procedure: Left Heart Cath with coronary angiography;  Surgeon: Minna Merritts, MD;  Location: [place] [facility];  Service: Cardiovascular;  Laterality: N/A;  ? CARDIAC CATHETERIZATION N/A 01/18/2015  ? Procedure: Intravascular Pressure Wire/FFR Study;  Surgeon: [name], MD;  Location: [place] [facility];  Service: Cardiovascular;  Laterality: N/A;  ? CAROTID STENT  03/10/2011  ? COLONOSCOPY  2010  ? COLONOSCOPY  06/14/2014  ? Dr Hilarie Fredrickson  ? CORONARY ARTERY BYPASS GRAFT N/A 01/24/2015  ? Procedure: CORONARY ARTERY BYPASS GRAFTING x 4 (LIMA-LAD, SVG-Int 1- Int 2, SVG-PD) ENDOSCOPIC GREATER SAPHENOUS VEIN HARVEST LEFT LEG;  Surgeon: [name], MD;  Location: [place];  Service: Open Heart Surgery;  Laterality: N/A;  ? EMBOLECTOMY  06/15/2019  ? Procedure: EMBOLECTOMY;  Surgeon: Katha Cabal, MD;  Location: [facility];  Service: Vascular;;  right superficial femoral artery  ? ENDARTERECTOMY FEMORAL  Right 06/15/2019  ? Procedure: ENDARTERECTOMY FEMORAL;  Surgeon: Katha Cabal, MD;  Location: [facility];  Service: Vascular;  Laterality: Right;  common femoral ?profunda femoris ?superficial femoral  ? ESOPHAGOGASTRODUODENOSCOPY (EGD) WITH PROPOFOL N/A 04/24/2016  ? Procedure: ESOPHAGOGASTRODUODENOSCOPY (EGD) WITH PROPOFOL;  Surgeon: Jerene Bears, MD;  [facility];  Service: Gastroenterology;  Laterality: N/A;  ? EYE SURGERY    ? lasik 15 yrs. ago, cataracts removed - both eyes   ? HAMMER TOE SURGERY    ? right toe  ? INSERTION OF ILIAC STENT Right 06/15/2019  ? Procedure: INSERTION OF ILIAC STENT ( STENTING OF SFA/POP ARTERY );  Surgeon: Katha Cabal, MD;  Location: [facility];  Service: Vascular;  Laterality: Right;  angioplpasty and stent placement: ?right superficial femoral ?right tibiopopliteal trunk ?bilateral common iliac arteries  ? IR IMAGING GUIDED PORT INSERTION  11/09/2020  ? LEFT HEART CATH AND CORONARY ANGIOGRAPHY Left 06/10/2017  ? Procedure: LEFT HEART CATH AND CORONARY ANGIOGRAPHY;  Surgeon: Minna Merritts, MD;  Location: [place] [facility];  Service: Cardiovascular;  Laterality: Left;  ? LOWER EXTREMITY ANGIOGRAPHY Left 01/04/2019  ? Procedure: LOWER EXTREMITY ANGIOGRAPHY;  Surgeon: Katha Cabal, MD;  [facility]  INV[facility];  Service: Cardiovascular;  Laterality: Left;  ? LOWER EXTREMITY ANGIOGRAPHY Right 01/25/2019  ? Procedure: LOWER EXTREMITY ANGIOGRAPHY;  Surgeon: Katha Cabal, MD;  Location: [place] [facility];  Service: Cardiovascular;  Laterality: Right;  ? LOWER EXTREMITY ANGIOGRAPHY Right 01/15/2021  ? LOWER EXTREMITY ANGIOGRAPHY and stent placement to R SFA and popliteal artery (Schnier, Dolores Lory, MD)  ? NASAL SINUS SURGERY  2008  ? septpolasty, bilateral turbinate reduction  ? SHOULDER ARTHROSCOPY  2012  ? TEE WITHOUT CARDIOVERSION N/A 01/24/2015  ? Procedure: TRANSESOPHAGEAL ECHOCARDIOGRAM (TEE);  Surgeon: [name], MD;   Location: [place];  Service: Open Heart Surgery;  Laterality: N/A;  ? TOE SURGERY  1994  ? UPPER GI ENDOSCOPY  07/2014, 04-24-16  ? Dr Raquel James  ? WRIST SURGERY  2011  ? ? ?SOCIAL HISTORY: ?Social History  ? ?Socioeconomic H

## 2021-10-17 NOTE — Addendum Note (Signed)
Addended by: Leeann Must on: 10/17/2021 02:02 PM ? ? Modules accepted: Orders ? ?

## 2021-10-17 NOTE — Patient Instructions (Signed)
[facility] [place]  Discharge Instructions: ?Thank you for choosing [facility] to provide your oncology and hematology care.  ?If you have a lab appointment with the [facility], please go directly to the [facility] and check in at the registration area. ? ?Wear comfortable clothing and clothing appropriate for easy access to any Portacath or PICC line.  ? ?We strive to give you quality time with your provider. You may need to reschedule your appointment if you arrive late (15 or more minutes).  Arriving late affects you and other patients whose appointments are after yours.  Also, if you miss three or more appointments without notifying the office, you may be dismissed from the clinic at the provider?s discretion.    ?  ?For prescription refill requests, have your pharmacy contact our office and allow 72 hours for refills to be completed.   ? ?Today you received the following chemotherapy and/or immunotherapy agents Keytruda    ?  ?To help prevent nausea and vomiting after your treatment, we encourage you to take your nausea medication as directed. ? ?BELOW ARE SYMPTOMS THAT SHOULD BE REPORTED IMMEDIATELY: ?*FEVER GREATER THAN 100.4 F (38 ?C) OR HIGHER ?*CHILLS OR SWEATING ?*NAUSEA AND VOMITING THAT IS NOT CONTROLLED WITH YOUR NAUSEA MEDICATION ?*UNUSUAL SHORTNESS OF BREATH ?*UNUSUAL BRUISING OR BLEEDING ?*URINARY PROBLEMS (pain or burning when urinating, or frequent urination) ?*BOWEL PROBLEMS (unusual diarrhea, constipation, pain near the anus) ?TENDERNESS IN MOUTH AND THROAT WITH OR WITHOUT PRESENCE OF ULCERS (sore throat, sores in mouth, or a toothache) ?UNUSUAL RASH, SWELLING OR PAIN  ?UNUSUAL VAGINAL DISCHARGE OR ITCHING  ? ?Items with * indicate a potential emergency and should be followed up as soon as possible or go to the Emergency Department if any problems should occur. ? ?Please show the CHEMOTHERAPY ALERT CARD or IMMUNOTHERAPY ALERT CARD at check-in to  the Emergency Department and triage nurse. ? ?Should you have questions after your visit or need to cancel or reschedule your appointment, please contact [facility] [place] AT [place]  [phone] and follow the prompts.  Office hours are 8:00 a.m. to 4:30 p.m. Monday - Friday. Please note that voicemails left after 4:00 p.m. may not be returned until the following business day.  We are closed weekends and major holidays. You have access to a nurse at all times for urgent questions. Please call the main number to the clinic [phone] and follow the prompts. ? ?For any non-urgent questions, you may also contact your provider using MyChart. We now offer e-Visits for anyone 80 and older to request care online for non-urgent symptoms. For details visit [email or web address]. ?  ?Also download the MyChart app! Go to the app store, search "MyChart", open the app, select [facility], and log in with your MyChart username and password. ? ?Due to Covid, a mask is required upon entering the hospital/clinic. If you do not have a mask, one will be given to you upon arrival. For doctor visits, patients may have 1 support person aged 80 or older with them. For treatment visits, patients cannot have anyone with them due to current Covid guidelines and our immunocompromised population.  ?

## 2021-10-21 ENCOUNTER — Telehealth: Payer: Self-pay | Admitting: *Deleted

## 2021-10-21 ENCOUNTER — Encounter: Payer: Self-pay | Admitting: Cardiovascular Disease

## 2021-10-21 ENCOUNTER — Ambulatory Visit: Payer: PPO | Admitting: Cardiovascular Disease

## 2021-10-21 VITALS — BP 110/60 | HR 75 | Ht 68.0 in | Wt 217.0 lb

## 2021-10-21 DIAGNOSIS — I25118 Atherosclerotic heart disease of native coronary artery with other forms of angina pectoris: Secondary | ICD-10-CM | POA: Diagnosis not present

## 2021-10-21 DIAGNOSIS — I10 Essential (primary) hypertension: Secondary | ICD-10-CM

## 2021-10-21 DIAGNOSIS — E785 Hyperlipidemia, unspecified: Secondary | ICD-10-CM

## 2021-10-21 DIAGNOSIS — I739 Peripheral vascular disease, unspecified: Secondary | ICD-10-CM | POA: Diagnosis not present

## 2021-10-21 DIAGNOSIS — Z951 Presence of aortocoronary bypass graft: Secondary | ICD-10-CM | POA: Diagnosis not present

## 2021-10-21 DIAGNOSIS — I480 Paroxysmal atrial fibrillation: Secondary | ICD-10-CM | POA: Diagnosis not present

## 2021-10-21 DIAGNOSIS — C3492 Malignant neoplasm of unspecified part of left bronchus or lung: Secondary | ICD-10-CM | POA: Diagnosis not present

## 2021-10-21 MED ORDER — SIMVASTATIN 40 MG PO TABS: 40.0000 mg | ORAL_TABLET | Freq: Every day | ORAL | 3 refills | Status: DC

## 2021-10-21 NOTE — Telephone Encounter (Signed)
Patient called asking if his appointment for 5/18 could be moved up because he is feeling weak and not feeling well in general. Please advise ?

## 2021-10-21 NOTE — Telephone Encounter (Signed)
He did not ask for Symptom Management Clinic, just knew he had IV fluids appointment and wanted it moved up. He gets IV fluids after his treatment frequently ? ?

## 2021-10-21 NOTE — Telephone Encounter (Signed)
Spoke to pt. He has decided to wait until Thursday and keep appointments as scheduled. He will call us if anything changes or worsens before then.  ?

## 2021-10-21 NOTE — Patient Instructions (Signed)
Medication Instructions:  No changes  If you need a refill on your cardiac medications before your next appointment, please call your pharmacy.   Lab work: No new labs needed  Testing/Procedures: No new testing needed  Follow-Up: At [facility], you and your health needs are our priority.  As part of our continuing mission to provide you with exceptional heart care, we have created designated Provider Care Teams.  These Care Teams include your primary Cardiologist (physician) and Advanced Practice Providers (APPs -  Physician Assistants and Nurse Practitioners) who all work together to provide you with the care you need, when you need it.  You will need a follow up appointment in 12 months  Providers on your designated Care Team:   Christopher Berge, NP Ryan Dunn, PA-C Cadence Furth, PA-C  COVID-19 Vaccine Information can be found at: [email or web address] For questions related to vaccine distribution or appointments, please email [email or web address] or call [phone].   

## 2021-10-21 NOTE — Telephone Encounter (Signed)
He is only scheduled for lab/IVF on 5/18.  Did he want to see [facility]? ?

## 2021-10-22 ENCOUNTER — Inpatient Hospital Stay: Payer: PPO

## 2021-10-22 ENCOUNTER — Telehealth: Payer: Self-pay | Admitting: *Deleted

## 2021-10-22 VITALS — BP 145/71 | HR 67 | Temp 97.8°F | Resp 19

## 2021-10-22 DIAGNOSIS — C3432 Malignant neoplasm of lower lobe, left bronchus or lung: Secondary | ICD-10-CM

## 2021-10-22 DIAGNOSIS — Z5112 Encounter for antineoplastic immunotherapy: Secondary | ICD-10-CM | POA: Diagnosis not present

## 2021-10-22 MED ORDER — SODIUM CHLORIDE 0.9% FLUSH
10.0000 mL | Freq: Once | INTRAVENOUS | Status: AC | PRN
  Administered 2021-10-22: 10 mL
  Filled 2021-10-22: qty 10

## 2021-10-22 MED ORDER — HEPARIN SOD (PORK) LOCK FLUSH 100 UNIT/ML IV SOLN
500.0000 [IU] | Freq: Once | INTRAVENOUS | Status: AC | PRN
  Administered 2021-10-22: 500 [IU]
  Filled 2021-10-22: qty 5

## 2021-10-22 MED ORDER — DEXAMETHASONE SODIUM PHOSPHATE 10 MG/ML IJ SOLN: 4.0000 mg | Freq: Once | INTRAMUSCULAR | Status: DC

## 2021-10-22 MED ORDER — SODIUM CHLORIDE 0.9 % IV SOLN
Freq: Once | INTRAVENOUS | Status: AC
  Filled 2021-10-22: qty 250

## 2021-10-22 NOTE — Telephone Encounter (Signed)
Patient has called again this morning stating that he does want to come in for IV fluids today as offered, but he declined because he is feeling really bad this morning. Please advise ?

## 2021-10-22 NOTE — Progress Notes (Signed)
Patient feels poorly since his [place] treatment on 10/17/21. Pt states each treatment makes him feel worse than the one before. Weakness and fatigue are his issues. Has chronic back pain. Appetite is good. ?

## 2021-10-22 NOTE — Telephone Encounter (Signed)
It is his usual post treatment complaints and he wants IV fluids, He is scheduled for Thursday and called yesterday asking for them, then changed his mind when Benjie Karvonen called then got up this morning and changed his mind again ?

## 2021-10-23 ENCOUNTER — Telehealth: Payer: Self-pay | Admitting: *Deleted

## 2021-10-23 NOTE — Telephone Encounter (Signed)
He opts to keep appointment because he still does not feel good whether he needs IV fluids or something else he said he needs something ?

## 2021-10-23 NOTE — Telephone Encounter (Signed)
Patient called stating that he was told by nurse yesterday that he did not need to keep appointment for tomorrow but the appointment has not been cancelled so he is asking if he needs to come or not. Please advise ?

## 2021-10-24 ENCOUNTER — Inpatient Hospital Stay: Payer: PPO

## 2021-10-24 VITALS — BP 120/53 | HR 68 | Temp 97.0°F | Resp 19

## 2021-10-24 DIAGNOSIS — Z5112 Encounter for antineoplastic immunotherapy: Secondary | ICD-10-CM | POA: Diagnosis not present

## 2021-10-24 DIAGNOSIS — C3432 Malignant neoplasm of lower lobe, left bronchus or lung: Secondary | ICD-10-CM

## 2021-10-24 LAB — CBC WITH DIFFERENTIAL/PLATELET
Abs Immature Granulocytes: 0.04 10*3/uL (ref 0.00–0.07)
Basophils Absolute: 0.1 10*3/uL (ref 0.0–0.1)
Basophils Relative: 1 %
Eosinophils Absolute: 0.4 10*3/uL (ref 0.0–0.5)
Eosinophils Relative: 6 %
HCT: 39 % (ref 39.0–52.0)
Hemoglobin: 13.2 g/dL (ref 13.0–17.0)
Immature Granulocytes: 1 %
Lymphocytes Relative: 21 %
Lymphs Abs: 1.5 10*3/uL (ref 0.7–4.0)
MCH: 31.5 pg (ref 26.0–34.0)
MCHC: 33.8 g/dL (ref 30.0–36.0)
MCV: 93.1 fL (ref 80.0–100.0)
Monocytes Absolute: 0.8 10*3/uL (ref 0.1–1.0)
Monocytes Relative: 11 %
Neutro Abs: 4.1 10*3/uL (ref 1.7–7.7)
Neutrophils Relative %: 60 %
Platelets: 199 10*3/uL (ref 150–400)
RBC: 4.19 MIL/uL — ABNORMAL LOW (ref 4.22–5.81)
RDW: 13.2 % (ref 11.5–15.5)
WBC: 6.9 10*3/uL (ref 4.0–10.5)
nRBC: 0 % (ref 0.0–0.2)

## 2021-10-24 LAB — COMPREHENSIVE METABOLIC PANEL
ALT: 16 U/L (ref 0–44)
AST: 16 U/L (ref 15–41)
Albumin: 3.8 g/dL (ref 3.5–5.0)
Alkaline Phosphatase: 53 U/L (ref 38–126)
Anion gap: 6 (ref 5–15)
BUN: 20 mg/dL (ref 8–23)
CO2: 23 mmol/L (ref 22–32)
Calcium: 8.8 mg/dL — ABNORMAL LOW (ref 8.9–10.3)
Chloride: 107 mmol/L (ref 98–111)
Creatinine, Ser: 1.01 mg/dL (ref 0.61–1.24)
GFR, Estimated: >60 mL/min (ref >60)
Glucose, Bld: 94 mg/dL (ref 70–99)
Potassium: 3.8 mmol/L (ref 3.5–5.1)
Sodium: 136 mmol/L (ref 135–145)
Total Bilirubin: 0.7 mg/dL (ref 0.3–1.2)
Total Protein: 6.5 g/dL (ref 6.5–8.1)

## 2021-10-24 MED ORDER — HEPARIN SOD (PORK) LOCK FLUSH 100 UNIT/ML IV SOLN
INTRAVENOUS | Status: AC
  Filled 2021-10-24: qty 5

## 2021-10-24 MED ORDER — SODIUM CHLORIDE 0.9% FLUSH
10.0000 mL | Freq: Once | INTRAVENOUS | Status: AC
  Administered 2021-10-24: 10 mL via INTRAVENOUS
  Filled 2021-10-24: qty 10

## 2021-10-24 MED ORDER — DEXAMETHASONE SODIUM PHOSPHATE 10 MG/ML IJ SOLN
4.0000 mg | Freq: Once | INTRAMUSCULAR | Status: AC
  Administered 2021-10-24: 4 mg via INTRAVENOUS
  Filled 2021-10-24: qty 1

## 2021-10-24 MED ORDER — SODIUM CHLORIDE 0.9 % IV SOLN
Freq: Once | INTRAVENOUS | Status: AC
  Filled 2021-10-24: qty 250

## 2021-10-29 ENCOUNTER — Inpatient Hospital Stay (HOSPITAL_BASED_OUTPATIENT_CLINIC_OR_DEPARTMENT_OTHER): Payer: PPO | Admitting: Hospice and Palliative Medicine

## 2021-10-29 ENCOUNTER — Other Ambulatory Visit: Payer: Self-pay | Admitting: *Deleted

## 2021-10-29 ENCOUNTER — Inpatient Hospital Stay: Payer: PPO

## 2021-10-29 ENCOUNTER — Telehealth: Payer: Self-pay | Admitting: *Deleted

## 2021-10-29 ENCOUNTER — Encounter: Payer: Self-pay | Admitting: Hospice and Palliative Medicine

## 2021-10-29 VITALS — BP 138/65 | HR 75 | Temp 99.1°F | Resp 16 | Wt 222.0 lb

## 2021-10-29 DIAGNOSIS — C3432 Malignant neoplasm of lower lobe, left bronchus or lung: Secondary | ICD-10-CM | POA: Diagnosis not present

## 2021-10-29 DIAGNOSIS — R5383 Other fatigue: Secondary | ICD-10-CM

## 2021-10-29 DIAGNOSIS — Z5112 Encounter for antineoplastic immunotherapy: Secondary | ICD-10-CM | POA: Diagnosis not present

## 2021-10-29 LAB — CBC WITH DIFFERENTIAL/PLATELET
Abs Immature Granulocytes: 0.06 10*3/uL (ref 0.00–0.07)
Basophils Absolute: 0.1 10*3/uL (ref 0.0–0.1)
Basophils Relative: 1 %
Eosinophils Absolute: 1 10*3/uL — ABNORMAL HIGH (ref 0.0–0.5)
Eosinophils Relative: 13 %
HCT: 40.3 % (ref 39.0–52.0)
Hemoglobin: 13.7 g/dL (ref 13.0–17.0)
Immature Granulocytes: 1 %
Lymphocytes Relative: 25 %
Lymphs Abs: 1.9 10*3/uL (ref 0.7–4.0)
MCH: 31.4 pg (ref 26.0–34.0)
MCHC: 34 g/dL (ref 30.0–36.0)
MCV: 92.4 fL (ref 80.0–100.0)
Monocytes Absolute: 0.9 10*3/uL (ref 0.1–1.0)
Monocytes Relative: 12 %
Neutro Abs: 3.6 10*3/uL (ref 1.7–7.7)
Neutrophils Relative %: 48 %
Platelets: 191 10*3/uL (ref 150–400)
RBC: 4.36 MIL/uL (ref 4.22–5.81)
RDW: 12.9 % (ref 11.5–15.5)
WBC: 7.5 10*3/uL (ref 4.0–10.5)
nRBC: 0 % (ref 0.0–0.2)

## 2021-10-29 LAB — BASIC METABOLIC PANEL
Anion gap: 6 (ref 5–15)
BUN: 17 mg/dL (ref 8–23)
CO2: 26 mmol/L (ref 22–32)
Calcium: 9 mg/dL (ref 8.9–10.3)
Chloride: 106 mmol/L (ref 98–111)
Creatinine, Ser: 0.99 mg/dL (ref 0.61–1.24)
GFR, Estimated: >60 mL/min (ref >60)
Glucose, Bld: 100 mg/dL — ABNORMAL HIGH (ref 70–99)
Potassium: 3.9 mmol/L (ref 3.5–5.1)
Sodium: 138 mmol/L (ref 135–145)

## 2021-10-29 MED ORDER — HEPARIN SOD (PORK) LOCK FLUSH 100 UNIT/ML IV SOLN
500.0000 [IU] | Freq: Once | INTRAVENOUS | Status: AC
  Administered 2021-10-29: 500 [IU] via INTRAVENOUS
  Filled 2021-10-29: qty 5

## 2021-10-29 MED ORDER — SODIUM CHLORIDE 0.9% FLUSH
10.0000 mL | Freq: Once | INTRAVENOUS | Status: AC
  Administered 2021-10-29: 10 mL via INTRAVENOUS
  Filled 2021-10-29: qty 10

## 2021-10-29 MED ORDER — METHYLPHENIDATE HCL 5 MG PO TABS: 5.0000 mg | ORAL_TABLET | Freq: Every day | ORAL | 0 refills | Status: DC

## 2021-10-29 NOTE — Progress Notes (Signed)
Symptom Management [place] at [facility] ([place]) [phone] [phone]) [phone]  Patient Care Team: Ria Bush, MD as PCP - General (Family Medicine) [name] [name], MD as PCP - Cardiology (Cardiology) [name], MD (Internal Medicine) Minna Merritts, MD as Consulting Physician (Cardiology) Pieter Partridge, DO as Consulting Physician (Neurology) Cammie Sickle, MD as Consulting Physician (Hematology and Oncology) Telford Nab, RN as Oncology Nurse Navigator   Name of the patient: Randall Ali  [id number]  [birth year]   Date of visit: 10/29/21  Reason for Consult:  Randall Ali is a 81 year old male with multiple medical problems including CAD status post CABG, PVD status post stenting, stage IV non-small cell lung cancer initially diagnosed May 2022 status post Carbo/Taxol now on maintenance Keytruda.  Patient has had previous and ongoing fatigue from [facility] requiring treatment holiday.  Patient saw Dr. Rogue Bussing on 10/17/2021 for follow-up.  Patient continues on Keytruda.  He has chronic fatigue and requires frequent supportive care and IV fluids.  Patient is on chronic steroids.  Patient presents to [facility] today for IV fluids.  He continues to endorse ongoing fatigue.    He denies any other significant changes or concerns.  No other symptomatic complaints at present.  Denies any neurologic complaints. Denies recent fevers or illnesses. Denies any easy bleeding or bruising. Reports good appetite and denies weight loss. Denies chest pain. Denies any nausea, vomiting, constipation.  Denies urinary complaints. Patient offers no further specific complaints today.  PAST MEDICAL HISTORY: Past Medical History:  Diagnosis Date   Allergy    seasonal   Arthritis    all over- in general    CAD (coronary artery disease)    a. inferior wall MI 10/01 s/p PCI/DES to RCA; b. Myoview 3/16 neg for ischemia; c. LHC 8/16: ostLAD 80%,  OM1 70%, OM2 70% x 2 lesions, mRCA 30%, dRCA 70% s/p 4-V CABG 01/24/15 (LIMA-LAD, VG- OM1, VG-OM2, VG-PDA)    Cancer (HCC)    skin, melanoma   Carotid artery disease ([place])    a. [place] 8/16: 1-39% bilateral ICA stenosis   Cataract    removed   Diastolic dysfunction    a. TTE 8/16: EF 55-60%, no RWMA, Gr1DD, calcified mitral annulus, mild biatrial enlargement   Erectile dysfunction    GERD (gastroesophageal reflux disease)    History of elbow surgery    History of hiatal hernia    HLD (hyperlipidemia)    HTN (hypertension)    Inferior myocardial infarction ([place]) 03/2000   stent RCA   Lung cancer ([place])    Postoperative wound infection 02/02/2015   Reflux esophagitis    Sleep apnea 2017   CPAP at night    PAST SURGICAL HISTORY:  Past Surgical History:  Procedure Laterality Date   arm surgery  2010   BROW LIFT Bilateral 11/25/2019   Procedure: BROW PTOSIS REPAIR BILATERAL;  Surgeon: Karle Starch, MD;  Location: [place];  Service: Ophthalmology;  Laterality: Bilateral;  sleep apnea   CARDIAC CATHETERIZATION  06/24/2011   CARDIAC CATHETERIZATION N/A 01/18/2015   Procedure: Left Heart Cath with coronary angiography;  Surgeon: Minna Merritts, MD;  Location: [place] [facility];  Service: Cardiovascular;  Laterality: N/A;   CARDIAC CATHETERIZATION N/A 01/18/2015   Procedure: Intravascular Pressure Wire/FFR Study;  Surgeon: [name], MD;  Location: [place] [facility];  Service: Cardiovascular;  Laterality: N/A;   CAROTID STENT  03/10/2011   COLONOSCOPY  2010   COLONOSCOPY  06/14/2014   Dr Hilarie Fredrickson   CORONARY ARTERY BYPASS GRAFT N/A 01/24/2015   Procedure: CORONARY ARTERY BYPASS GRAFTING x 4 (LIMA-LAD, SVG-Int 1- Int 2, SVG-PD) ENDOSCOPIC GREATER SAPHENOUS VEIN HARVEST LEFT LEG;  Surgeon: [name], MD;  Location: [place];  Service: Open Heart Surgery;  Laterality: N/A;   EMBOLECTOMY  06/15/2019   Procedure: EMBOLECTOMY;  Surgeon: Katha Cabal, MD;   Location: [facility];  Service: Vascular;;  right superficial femoral artery   ENDARTERECTOMY FEMORAL Right 06/15/2019   Procedure: ENDARTERECTOMY FEMORAL;  Surgeon: Katha Cabal, MD;  Location: [facility];  Service: Vascular;  Laterality: Right;  common femoral profunda femoris superficial femoral   ESOPHAGOGASTRODUODENOSCOPY (EGD) WITH PROPOFOL N/A 04/24/2016   Procedure: ESOPHAGOGASTRODUODENOSCOPY (EGD) WITH PROPOFOL;  Surgeon: Jerene Bears, MD;  Location: [facility];  Service: Gastroenterology;  Laterality: N/A;   EYE SURGERY     lasik 15 yrs. ago, cataracts removed - both eyes    HAMMER TOE SURGERY     right toe   INSERTION OF ILIAC STENT Right 06/15/2019   Procedure: INSERTION OF ILIAC STENT ( STENTING OF SFA/POP ARTERY );  Surgeon: Katha Cabal, MD;  Location: [facility];  Service: Vascular;  Laterality: Right;  angioplpasty and stent placement: right superficial femoral right tibiopopliteal trunk bilateral common iliac arteries   IR IMAGING GUIDED PORT INSERTION  11/09/2020   LEFT HEART CATH AND CORONARY ANGIOGRAPHY Left 06/10/2017   Procedure: LEFT HEART CATH AND CORONARY ANGIOGRAPHY;  Surgeon: Minna Merritts, MD;  Location: [place] [facility];  Service: Cardiovascular;  Laterality: Left;   LOWER EXTREMITY ANGIOGRAPHY Left 01/04/2019   Procedure: LOWER EXTREMITY ANGIOGRAPHY;  Surgeon: Katha Cabal, MD;  Location: [place] [facility];  Service: Cardiovascular;  Laterality: Left;   LOWER EXTREMITY ANGIOGRAPHY Right 01/25/2019   Procedure: LOWER EXTREMITY ANGIOGRAPHY;  Surgeon: Katha Cabal, MD;  Location: [place] [facility];  Service: Cardiovascular;  Laterality: Right;   LOWER EXTREMITY ANGIOGRAPHY Right 01/15/2021   LOWER EXTREMITY ANGIOGRAPHY and stent placement to R SFA and popliteal artery Delana Meyer, Dolores Lory, MD)   NASAL SINUS SURGERY  2008   septpolasty, bilateral turbinate reduction   SHOULDER ARTHROSCOPY  2012   TEE WITHOUT CARDIOVERSION N/A  01/24/2015   Procedure: TRANSESOPHAGEAL ECHOCARDIOGRAM (TEE);  Surgeon: [name], MD;  Location: [facility];  Service: Open Heart Surgery;  Laterality: N/A;   TOE SURGERY  1994   UPPER GI ENDOSCOPY  07/2014, 04-24-16   Dr Raquel James   WRIST SURGERY  2011    HEMATOLOGY/ONCOLOGY HISTORY:  Oncology History Overview Note  #MAY 2022-Lung cancer-non-small cell [CT guided bx] T3N1 vs stage IV Dr.Hendrickson.  MRI brain negative for malignancy.# 1. April 2022- LLL ~4.0 cm mass in the superior segment left lower lobe abuts the major fissure and the posterior pleural surface without visible chest wall invasion without pleural effusion. There 2-3 other small nodules in the left lower lobe, largest measures 9 mm in diameter. These are suspicious for same lobe satellite lesions and there is left hilar adenopathy. Assuming non-small cell lung cancer the appearance is compatible with T3 N1 M0 disease (stage IIIA).  # right suprarenal nodule-awaiting biopsy on 5/31- non-small cell [revived at tumor conference at [facility]  # June 9th 2022- CARBO-TAXOl-KEYTRUDA; Fulphila  # ? Subtle adrenal insufficiency- No acute process - -September MRI brain negative for any pituitary hypophysitis/brain metastasis. Prednisone   MOLECULAR TESTING: NGS TPS-PDL 100%; EXON 12 amplification* mutations.    # CAD [  CABG 2016; Dr.Gollan]   Cancer of lower lobe of left lung ([place])  11/02/2020 Initial Diagnosis   Cancer of lower lobe of left lung ([place])   11/02/2020 Cancer Staging   Staging form: Lung, AJCC 8th Edition - Clinical: Stage IVA (cT3, cN1, cM1a) - Signed by Cammie Sickle, MD on 11/02/2020   11/16/2020 -  Chemotherapy   Patient is on Treatment Plan : LUNG NSCLC Carboplatin + Paclitaxel + Pembrolizumab q21d x 4 cycles / Pembrolizumab Maintenance Q21D       ALLERGIES:  is allergic to flomax [tamsulosin].  MEDICATIONS:  Current Outpatient Medications  Medication Sig Dispense Refill   acetaminophen (TYLENOL)  325 MG tablet Take 650 mg by mouth as needed for moderate pain or mild pain.     albuterol (VENTOLIN HFA) 108 (90 Base) MCG/ACT inhaler INHALE 2 PUFFS INTO THE LUNGS EVERY 6 HOURS AS NEEDED FOR WHEEZING OR SHORTNESS OF BREATH 8.5 g 2   Ascorbic Acid (VITAMIN C) 1000 MG tablet Take 1,000 mg by mouth daily.     aspirin 81 MG EC tablet Take 81 mg by mouth daily.       Calcium-Magnesium-Vitamin D (CALCIUM 1200+D3 PO) Take 1 tablet by mouth daily.     Carboxymethylcellul-Glycerin (LUBRICATING EYE DROPS OP) Place 1 drop into both eyes daily as needed (irritation).     Cholecalciferol (VITAMIN D3) 50 MCG (2000 UT) TABS Take 2,000 Units by mouth daily.     Cyanocobalamin (B-12) 5000 MCG CAPS Take 5,000 mcg by mouth daily.     docusate sodium (COLACE) 100 MG capsule Take 200 mg by mouth at bedtime.     fluticasone-salmeterol (ADVAIR HFA) 115-21 MCG/ACT inhaler Inhale 2 puffs into the lungs 2 (two) times daily. 1 each 12   lidocaine-prilocaine (EMLA) cream Apply 30 -45 mins prior to port access. 30 g 3   lisinopril (ZESTRIL) 5 MG tablet Take 1 tablet (5 mg total) by mouth daily. 90 tablet 3   Melatonin 10 MG CAPS Take 10 mg by mouth at bedtime as needed (sleep).     nitroGLYCERIN (NITROSTAT) 0.4 MG SL tablet Place 1 tablet (0.4 mg total) under the tongue every 5 (five) minutes as needed for up to 25 doses for chest pain. 25 tablet 1   pantoprazole (PROTONIX) 40 MG tablet TAKE 1 TABLET BY MOUTH DAILY 90 tablet 3   prochlorperazine (COMPAZINE) 10 MG tablet Take 1 tablet (10 mg total) by mouth every 6 (six) hours as needed for nausea or vomiting. 40 tablet 1   simvastatin (ZOCOR) 40 MG tablet Take 1 tablet (40 mg total) by mouth at bedtime. 90 tablet 3   traMADol (ULTRAM) 50 MG tablet TAKE 1 TABLET BY MOUTH 3 TIMES DAILY AS NEEDED FOR MODERATE PAIN 90 tablet 0   triamcinolone ointment (KENALOG) 0.5 %      Vitamin E 450 MG (1000 UT) CAPS Take 450 Units by mouth daily.     Zinc 50 MG TABS Take 50 mg by mouth  daily.     No current facility-administered medications for this visit.   Facility-Administered Medications Ordered in Other Visits  Medication Dose Route Frequency Provider Last Rate Last Admin   heparin lock flush 100 UNIT/ML injection             VITAL SIGNS: There were no vitals taken for this visit. There were no vitals filed for this visit.  Estimated body mass index is 32.99 kg/m as calculated from the following:   Height as of  10/21/21: 5\' 8"  (1.727 m).   Weight as of 10/21/21: 217 lb (98.4 kg).  LABS: CBC:    Component Value Date/Time   WBC 6.9 10/24/2021 1006   HGB 13.2 10/24/2021 1006   HGB 14.8 01/15/2015 0956   HCT 39.0 10/24/2021 1006   HCT 44.2 01/15/2015 0956   PLT 199 10/24/2021 1006   PLT 207 01/15/2015 0956   MCV 93.1 10/24/2021 1006   MCV 91 01/15/2015 0956   NEUTROABS 4.1 10/24/2021 1006   NEUTROABS 5.2 01/15/2015 0956   LYMPHSABS 1.5 10/24/2021 1006   LYMPHSABS 1.9 01/15/2015 0956   MONOABS 0.8 10/24/2021 1006   EOSABS 0.4 10/24/2021 1006   EOSABS 0.2 01/15/2015 0956   BASOSABS 0.1 10/24/2021 1006   BASOSABS 0.0 01/15/2015 0956   Comprehensive Metabolic Panel:    Component Value Date/Time   NA 136 10/24/2021 1006   NA 138 01/15/2015 0956   K 3.8 10/24/2021 1006   CL 107 10/24/2021 1006   CO2 23 10/24/2021 1006   BUN 20 10/24/2021 1006   BUN 16 01/15/2015 0956   CREATININE 1.01 10/24/2021 1006   GLUCOSE 94 10/24/2021 1006   CALCIUM 8.8 (L) 10/24/2021 1006   AST 16 10/24/2021 1006   ALT 16 10/24/2021 1006   ALKPHOS 53 10/24/2021 1006   BILITOT 0.7 10/24/2021 1006   PROT 6.5 10/24/2021 1006   ALBUMIN 3.8 10/24/2021 1006    RADIOGRAPHIC STUDIES: DG HIP UNILAT W OR W/O PELVIS 2-3 VIEWS LEFT  Result Date: 10/20/2021 CLINICAL DATA:  Bilateral hip pain for 3-4 months. EXAM: DG HIP (WITH OR WITHOUT PELVIS) 2-3V RIGHT; DG HIP (WITH OR WITHOUT PELVIS) 2-3V LEFT COMPARISON:  None Available. FINDINGS: There is diffuse decreased bone  mineralization. Mild bilateral superolateral acetabular degenerative osteophytosis. Mild bilateral inferior sacroiliac joint subchondral sclerosis without significant joint space narrowing. Mild superior pubic symphysis joint space narrowing, subchondral sclerosis and superior degenerative spurring. Within the limitations of diffuse decreased bone mineralization, no acute fracture is seen. Moderate vascular calcifications. IMPRESSION: 1. Mild bilateral femoroacetabular osteoarthritis. 2. Mild pubic symphysis osteoarthritis. Electronically Signed   By: Yvonne Kendall M.D.   On: 10/20/2021 16:12   DG HIP UNILAT W OR W/O PELVIS 2-3 VIEWS RIGHT  Result Date: 10/20/2021 CLINICAL DATA:  Bilateral hip pain for 3-4 months. EXAM: DG HIP (WITH OR WITHOUT PELVIS) 2-3V RIGHT; DG HIP (WITH OR WITHOUT PELVIS) 2-3V LEFT COMPARISON:  None Available. FINDINGS: There is diffuse decreased bone mineralization. Mild bilateral superolateral acetabular degenerative osteophytosis. Mild bilateral inferior sacroiliac joint subchondral sclerosis without significant joint space narrowing. Mild superior pubic symphysis joint space narrowing, subchondral sclerosis and superior degenerative spurring. Within the limitations of diffuse decreased bone mineralization, no acute fracture is seen. Moderate vascular calcifications. IMPRESSION: 1. Mild bilateral femoroacetabular osteoarthritis. 2. Mild pubic symphysis osteoarthritis. Electronically Signed   By: Yvonne Kendall M.D.   On: 10/20/2021 16:12    PERFORMANCE STATUS (ECOG) : 1 - Symptomatic but completely ambulatory  Review of Systems Unless otherwise noted, a complete review of systems is negative.  Physical Exam General: NAD Cardiovascular: regular rate and rhythm Pulmonary: clear ant fields Abdomen: soft, nontender, + bowel sounds GU: no suprapubic tenderness Extremities: no edema, no joint deformities Skin: no rashes Neurological: Grossly nonfocal, CN II through XII  intact  Assessment and Plan- Patient is a 81 y.o. male with multiple medical problems including CAD status post CABG, PVD status post stenting, stage IV non-small cell lung cancer initially diagnosed May 2022 status post Carbo/Taxol now  on maintenance Keytruda.  Patient has had previous and ongoing fatigue from [facility] requiring treatment holiday.  Back on Keytruda. Patient presents to [facility] for evaluation of fatigue.   Fatigue -this is a chronic problem for patient.  It is unchanged in characteristic or severity.  At times, patient reports that he has some improvement with IV fluids and steroids but not consistently.  Today, labs are fairly normal.  Again discussed the nature of fatigue as an expected symptom in the setting of stage IV cancer.  I had previously tried American ginseng for fatigue but this did not seem to help.  Patient is still exercising daily, which evidence shows does help improve cancer fatigue.  Discussed with Dr. Rogue Bussing and will start patient on low-dose methylphenidate in the a.m. to see if this helps.  We will schedule follow-up visit in 2 weeks to reassess symptoms and would discontinue methylphenidate at that time if no clinical improvement.  Case and plan discussed with Dr. Rogue Bussing.    Patient expressed understanding and was in agreement with this plan. He also understands that He can call clinic at any time with any questions, concerns, or complaints.   Thank you for allowing me to participate in the care of this very pleasant patient.   Time Total: 15 minutes  Visit consisted of counseling and education dealing with the complex and emotionally intense issues of symptom management in the setting of serious illness.Greater than 50%  of this time was spent counseling and coordinating care related to the above assessment and plan.  Signed by: Altha Harm, PhD, NP-C

## 2021-10-29 NOTE — Telephone Encounter (Signed)
Patient feels really bad and wants to know if he can come in today and get some IV fluids and steroids. Please call him @ [phone]

## 2021-10-29 NOTE — Telephone Encounter (Signed)
Called pt, states "just not feeling well" states is eating and drinking but has no energy.  Scheduled to be seen in [facility].

## 2021-10-29 NOTE — Progress Notes (Signed)
Pt in for [facility] today, states has been more fatigued over last 3 days.  Pt states  "Just don't feel good".

## 2021-10-31 ENCOUNTER — Ambulatory Visit: Payer: PPO | Admitting: Internal Medicine

## 2021-10-31 ENCOUNTER — Encounter: Payer: Self-pay | Admitting: Internal Medicine

## 2021-10-31 DIAGNOSIS — R0609 Other forms of dyspnea: Secondary | ICD-10-CM | POA: Diagnosis not present

## 2021-10-31 DIAGNOSIS — I10 Essential (primary) hypertension: Secondary | ICD-10-CM

## 2021-10-31 MED ORDER — VALSARTAN 80 MG PO TABS: ORAL_TABLET | ORAL | 11 refills | Status: DC

## 2021-10-31 NOTE — Patient Instructions (Addendum)
Only use your albuterol as a rescue medication to be used if you can't catch your breath by resting or doing a relaxed purse lip breathing pattern.  - The less you use it, the better it will work when you need it. - Ok to use up to 2 puffs  every 4 hours if you must but call for immediate appointment if use goes up over your usual need - Don't leave home without it !!  (think of it like the spare tire for your car)   Also Ok to try albuterol 15 min before an activity (on alternating days)  that you know would usually make you short of breath and see if it makes any difference and if makes none then don't take albuterol after activity unless you can't catch your breath as this means it's the resting that helps, not the albuterol.      Stop lisinopril 5 mg  and replace with valsartan 80 mg one daily   I will check your scan for your breathing and let you know if I have any additional recs   Continue pantoprazole  Take 30-60 min before first meal of the day  GERD (REFLUX)  is an extremely common cause of respiratory symptoms just like yours , many times with no obvious heartburn at all.    It can be treated with medication, but also with lifestyle changes including elevation of the head of your bed (ideally with 6 -8inch blocks under the headboard of your bed),  Smoking cessation, avoidance of late meals, excessive alcohol, and avoid fatty foods, chocolate, peppermint, colas, red wine, and acidic juices such as orange juice.  NO MINT OR MENTHOL PRODUCTS SO NO COUGH DROPS  USE SUGARLESS CANDY INSTEAD (Jolley ranchers or Stover's or Life Savers) or even ice chips will also do - the key is to swallow to prevent all throat clearing. NO OIL BASED VITAMINS - use powdered substitutes.  Avoid fish oil when coughing.   Please schedule a follow up office visit in 6 weeks, call sooner if needed - bring inhalers with you but don't take that am

## 2021-10-31 NOTE — Progress Notes (Signed)
Randall Lofts., male    DOB: 13-Oct-1940   MRN: [id number]   Brief patient profile:  36  yowm  quit smoking 2001  referred to pulmonary clinic in [facility]  10/31/2021 by Dr Dalbert Mayotte for variable doe with PFT 's nl 2016 x for ERV 13% @ wt  85   Sees Dr Mortimer Fries for OSA   HEMATOLOGY/ONCOLOGY HISTORY:      Oncology History Overview Note   #MAY 2022-Lung cancer-non-small cell [CT guided bx] T3N1 vs stage IV Dr.Hendrickson.  MRI brain negative for malignancy.# 1. April 2022- LLL ~4.0 cm mass in the superior segment left lower lobe abuts the major fissure and the posterior pleural surface without visible chest wall invasion without pleural effusion. There 2-3 other small nodules in the left lower lobe, largest measures 9 mm in diameter. These are suspicious for same lobe satellite lesions and there is left hilar adenopathy. Assuming non-small cell lung cancer the appearance is compatible with T3 N1 M0 disease (stage IIIA).   # right suprarenal nodule-awaiting biopsy on 5/31- non-small cell [revived at tumor conference at [facility]   # June 9th 2022- CARBO-TAXOl-KEYTRUDA; Fulphila   # ? Subtle adrenal insufficiency- No acute process - -September MRI brain negative for any pituitary hypophysitis/brain metastasis. Prednisone    MOLECULAR TESTING: NGS TPS-PDL 100%; EXON 12 amplification* mutations.       # CAD [CABG 2016; Dr.Gollan]    Cancer of lower lobe of left lung ([place])   11/02/2020 Initial Diagnosis     Cancer of lower lobe of left lung ([place])     11/02/2020 Cancer Staging     Staging form: Lung, AJCC 8th Edition - Clinical: Stage IVA (cT3, cN1, cM1a) - Signed by Cammie Sickle, MD on 11/02/2020     11/16/2020 -  Chemotherapy     Patient is on Treatment Plan : LUNG NSCLC Carboplatin + Paclitaxel + Pembrolizumab q21d x 4 cycles / Pembrolizumab Maintenance Q21D          History of Present Illness  10/31/2021  Pulmonary/ 1st office eval/ Bretton Tandy / [place]  Office at wt 62  Chief  Complaint  Patient presents with   Follow-up    Patient feels like he is having more shortness of breath with activity and states "he is breathing real hard" Patient has CPAP and is sleeping good.  Dyspnea:  legs stop before breathing after 200  ft (2 aisles) / work out at gym rides bike for 30 min s stopping twice weekly at yet variably doe 50 ft at hme  Cough: none but variable congestion in throat /globus sensation  Sleep: fine on cpap  SABA use: saba helps variable  "wheeze" last used 7am  day of ov  No obvious day to day or daytime variability or assoc excess/ purulent sputum or mucus plugs or hemoptysis or cp or chest tightness, subjective wheeze or overt sinus or hb symptoms.   Sleeping as above  without nocturnal  or early am exacerbation  of respiratory  c/o's or need for noct saba. Also denies any obvious fluctuation of symptoms with weather or environmental changes or other aggravating or alleviating factors except as outlined above   No unusual exposure hx or h/o childhood pna/ asthma or knowledge of premature birth.  Current Allergies, Complete Past Medical History, Past Surgical History, Family History, and Social History were reviewed in [employer] record.  ROS  The following are not active complaints unless bolded Hoarseness, sore throat, dysphagia,  dental problems, itching, sneezing,  nasal congestion or discharge of excess mucus or purulent secretions, ear ache,   fever, chills, sweats, unintended wt loss or wt gain, classically pleuritic or exertional cp,  orthopnea pnd or arm/hand swelling  or leg swelling, presyncope, palpitations, abdominal pain, anorexia, nausea, vomiting, diarrhea  or change in bowel habits or change in bladder habits, change in stools or change in urine, dysuria, hematuria,  rash, arthralgias, visual complaints, headache, numbness, weakness or ataxia or problems with walking or coordination,  change in mood or  memory.            Past Medical History:  Diagnosis Date   Allergy    seasonal   Arthritis    all over- in general    CAD (coronary artery disease)    a. inferior wall MI 10/01 s/p PCI/DES to RCA; b. Myoview 3/16 neg for ischemia; c. LHC 8/16: ostLAD 80%, OM1 70%, OM2 70% x 2 lesions, mRCA 30%, dRCA 70% s/p 4-V CABG 01/24/15 (LIMA-LAD, VG- OM1, VG-OM2, VG-PDA)    Cancer (HCC)    skin, melanoma   Carotid artery disease ([place])    a. [place] 8/16: 1-39% bilateral ICA stenosis   Cataract    removed   Diastolic dysfunction    a. 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     albuterol (VENTOLIN HFA) 108 (90 Base) MCG/ACT inhaler INHALE 2 PUFFS INTO THE LUNGS EVERY 6 HOURS AS NEEDED FOR WHEEZING OR SHORTNESS OF BREATH 8.5 g 2   Ascorbic Acid (VITAMIN C) 1000 MG tablet Take 1,000 mg by mouth daily.     aspirin 81 MG EC tablet Take 81 mg by mouth daily.       Calcium-Magnesium-Vitamin D (CALCIUM 1200+D3 PO) Take 1 tablet by mouth daily.     Carboxymethylcellul-Glycerin (LUBRICATING EYE DROPS OP) Place 1 drop into both eyes daily as needed (irritation).     Cholecalciferol (VITAMIN D3) 50 MCG (2000 UT) TABS Take 2,000 Units by mouth daily.     Cyanocobalamin (B-12) 5000 MCG CAPS Take 5,000 mcg by mouth daily.     docusate sodium (COLACE) 100 MG capsule Take 200 mg by mouth at  bedtime.     fluticasone (FLONASE) 50 MCG/ACT nasal spray Place into both nostrils.            lidocaine-prilocaine (EMLA) cream Apply 30 -45 mins prior to port access. 30 g 3   lisinopril (ZESTRIL) 5 MG tablet Take 1 tablet (5 mg total) by mouth daily. 90 tablet 3   Melatonin 10 MG CAPS Take 10 mg by mouth at bedtime as needed (sleep).     methylphenidate (RITALIN) 5 MG tablet Take 1 tablet (5 mg total) by mouth daily. 15 tablet 0  nitroGLYCERIN (NITROSTAT) 0.4 MG SL tablet Place 1 tablet (0.4 mg total) under the tongue every 5 (five) minutes as needed for up to 25 doses for chest pain. 25 tablet 1   pantoprazole (PROTONIX) 40 MG tablet TAKE 1 TABLET BY MOUTH DAILY 90 tablet 3   prochlorperazine (COMPAZINE) 10 MG tablet Take 1 tablet (10 mg total) by mouth every 6 (six) hours as needed for nausea or vomiting. 40 tablet 1   simvastatin (ZOCOR) 40 MG tablet Take 1 tablet (40 mg total) by mouth at bedtime. 90 tablet 3   traMADol (ULTRAM) 50 MG tablet TAKE 1 TABLET BY MOUTH 3 TIMES DAILY AS NEEDED FOR MODERATE PAIN 90 tablet 0   triamcinolone ointment (KENALOG) 0.5 %      Vitamin E 450 MG (1000 UT) CAPS Take 450 Units by mouth daily.     Zinc 50 MG TABS Take 50 mg by mouth daily.     Facility-Administered Medications Prior to Visit  Medication Dose Route Frequency Provider Last Rate Last Admin   heparin lock flush 100 UNIT/ML injection              Objective:     BP 130/88 (BP Location: Left Arm, Patient Position: Sitting, Cuff Size: Normal)   Pulse 70   Temp 97.8 F (36.6 C) (Oral)   Ht 5\' 8"  (1.727 m)   Wt 219 lb (99.3 kg)   SpO2 94%   BMI 33.30 kg/m   SpO2: 94 %  Amb mod obese wm freq throat clearing    HEENT : Oropharynx  clear   Nasal turbintes nl    NECK :  without  appent JVD/ palpable Nodes/TM    LUNGS: no acc muscle use,  Nl contour chest which is clear to A and P bilaterally without cough on insp or exp maneuvers   CV:  RRR  no s3 or murmur or increase in P2,  and no edema   ABD:  soft and nontender with nl inspiratory excursion in the supine position. No bruits or organomegaly appreciated   MS:  Nl gait/ ext warm without deformities Or obvious joint restrictions  calf tenderness, cyanosis or clubbing     SKIN: warm and dry without lesions    NEURO:  alert, approp, nl sensorium with  no motor or cerebellar deficits apparent.       Assessment   DOE (dyspnea on exertion) Stopped smoking 2001 Onset early 2023  Highly variable on ACEi  -PFT 's nl 2016 x for ERV 13% @ wt  210 - 10/31/2021  RA pt  walked average pace and expressed shortness of breath p 350 ft. Stated he needed to stop due to his back and legs hurting with lowest sats 93% - trial off acei 10/31/2021   Symptoms   disproportionate to objective findings and not clear to what extent this is actually a pulmonary  problem but pt does appear to have difficult to sort out respiratory symptoms of unknown origin for which  DDX  = almost all start with A and  include Adherence, Ace Inhibitors, Acid Reflux, Active Sinus Disease, Alpha 1 Antitripsin deficiency, Anxiety masquerading as Airways dz,  ABPA,  Allergy(esp in young), Aspiration (esp in elderly), Adverse effects of meds,  Active smoking or Vaping, A bunch of PE's/clot burden (a few small clots can't cause this syndrome unless there is already severe underlying pulm or vascular dz with poor reserve),  Anemia or thyroid disorder, plus two Bs  = Bronchiectasis and  Beta blocker use..and one C= CHF   Adherence is always the initial "prime suspect" and is a multilayered concern that requires a "trust but verify" approach in every patient - starting with knowing how to use medications, especially inhalers, correctly, keeping up with refills and understanding the fundamental difference between maintenance and prns vs those medications only taken for a very short course and then stopped and not refilled.  - - The proper method of use, as well as  anticipated side effects, of a metered-dose inhaler were discussed and demonstrated to the patient using teach back method. I  - return with all meds in hand using a trust but verify approach to confirm accurate Medication  Reconciliation The principal here is that until we are certain that the  patients are doing what we've asked, it makes no sense to ask them to do more.  ACEi adverse effects at the  top of the usual list of suspects and the only way to rule it out is a trial off > see a/p    ? Acid (or non-acid) GERD > always difficult to exclude as up to 75% of pts in some series report no assoc GI/ Heartburn symptoms> rec continue PPi dosed ac daily  and diet restrictions/ reviewed    ? Anxiety/depression/ deconditioning  >  usually at the bottom of this list of usual suspects but should be much higher on this pt's based on H and P and note already on psychotropics and may interfere with adherence and also interpretation of response or lack thereof to symptom management which can be quite subjective.    F/u in 6 weeks with inhalers in hand for further w/u then if indicated.       .  Essential hypertension D/c ACEi  10/31/2021 due to un-explained dyspnea/ globus sensation   In the best review of chronic cough to date ( NEJM 2016 375 0569-7948) ,  ACEi are now felt to cause cough in up to  20% of pts which is a 4 fold increase from previous reports and does not include the variety of non-specific complaints we see in pulmonary clinic in pts on ACEi but previously attributed to another dx like  Copd/asthma and  include PNDS, throat  Congestion (globus), "bronchitis", unexplained dyspnea and noct "strangling" sensations, and hoarseness, but also  atypical /refractory GERD symptoms like dysphagia and "bad heartburn"   The only way I know  to prove this is not an "ACEi Case" is a trial off ACEi x a minimum of 6 weeks then regroup.   >>> try valsartan 80 mg one daily and f/u in 6 weeks to  regroup    Each maintenance medication was reviewed in detail including emphasizing most importantly the difference between maintenance and prns and under what circumstances the prns are to be triggered using an action plan format where appropriate.  Total time for H and P, chart review, counseling, reviewing hfa device(s) , directly observing portions of ambulatory 02 saturation study/ and generating customized AVS unique to this office visit / same day charting = 51 min with pt new to me             Christinia Gully, MD 10/31/2021

## 2021-10-31 NOTE — Assessment & Plan Note (Addendum)
D/c ACEi  10/31/2021 due to un-explained dyspnea/ globus sensation   In the best review of chronic cough to date ( NEJM 2016 375 [id number]) ,  ACEi are now felt to cause cough in up to  20% of pts which is a 4 fold increase from previous reports and does not include the variety of non-specific complaints we see in pulmonary clinic in pts on ACEi but previously attributed to another dx like  Copd/asthma and  include PNDS, throat  Congestion (globus), "bronchitis", unexplained dyspnea and noct "strangling" sensations, and hoarseness, but also  atypical /refractory GERD symptoms like dysphagia and "bad heartburn"   The only way I know  to prove this is not an "ACEi Case" is a trial off ACEi x a minimum of 6 weeks then regroup.   >>> try valsartan 80 mg one daily and f/u in 6 weeks to regroup    Each maintenance medication was reviewed in detail including emphasizing most importantly the difference between maintenance and prns and under what circumstances the prns are to be triggered using an action plan format where appropriate.  Total time for H and P, chart review, counseling, reviewing hfa device(s) , directly observing portions of ambulatory 02 saturation study/ and generating customized AVS unique to this office visit / same day charting = 51 min with pt new to me

## 2021-10-31 NOTE — Assessment & Plan Note (Addendum)
Stopped smoking 2001 Onset early 2023  Highly variable on ACEi  -PFT 's nl 2016 x for ERV 13% @ wt  210 - 10/31/2021  RA pt  walked average pace and expressed shortness of breath p 350 ft. Stated he needed to stop due to his back and legs hurting with lowest sats 93% - trial off acei 10/31/2021   Symptoms   disproportionate to objective findings and not clear to what extent this is actually a pulmonary  problem but pt does appear to have difficult to sort out respiratory symptoms of unknown origin for which  DDX  = almost all start with A and  include Adherence, Ace Inhibitors, Acid Reflux, Active Sinus Disease, Alpha 1 Antitripsin deficiency, Anxiety masquerading as Airways dz,  ABPA,  Allergy(esp in young), Aspiration (esp in elderly), Adverse effects of meds,  Active smoking or Vaping, A bunch of PE's/clot burden (a few small clots can't cause this syndrome unless there is already severe underlying pulm or vascular dz with poor reserve),  Anemia or thyroid disorder, plus two Bs  = Bronchiectasis and Beta blocker use..and one C= CHF   Adherence is always the initial "prime suspect" and is a multilayered concern that requires a "trust but verify" approach in every patient - starting with knowing how to use medications, especially inhalers, correctly, keeping up with refills and understanding the fundamental difference between maintenance and prns vs those medications only taken for a very short course and then stopped and not refilled.  - - The proper method of use, as well as anticipated side effects, of a metered-dose inhaler were discussed and demonstrated to the patient using teach back method. I  - return with all meds in hand using a trust but verify approach to confirm accurate Medication  Reconciliation The principal here is that until we are certain that the  patients are doing what we've asked, it makes no sense to ask them to do more.  ACEi adverse effects at the  top of the usual list of  suspects and the only way to rule it out is a trial off > see a/p    ? Acid (or non-acid) GERD > always difficult to exclude as up to 75% of pts in some series report no assoc GI/ Heartburn symptoms> rec continue PPi dosed ac daily  and diet restrictions/ reviewed    ? Anxiety/depression/ deconditioning  >  usually at the bottom of this list of usual suspects but should be much higher on this pt's based on H and P and note already on psychotropics and may interfere with adherence and also interpretation of response or lack thereof to symptom management which can be quite subjective.    F/u in 6 weeks with inhalers in hand for further w/u then if indicated.       [name]

## 2021-11-01 ENCOUNTER — Ambulatory Visit: Admission: RE | Admit: 2021-11-01 | Payer: PPO | Source: Ambulatory Visit

## 2021-11-01 ENCOUNTER — Ambulatory Visit
Admission: RE | Admit: 2021-11-01 | Discharge: 2021-11-01 | Disposition: A | Discharge status: Home / Self Care | Payer: PPO | Source: Ambulatory Visit | Attending: Internal Medicine | Admitting: Internal Medicine

## 2021-11-01 DIAGNOSIS — C3432 Malignant neoplasm of lower lobe, left bronchus or lung: Secondary | ICD-10-CM | POA: Diagnosis not present

## 2021-11-01 DIAGNOSIS — K6389 Other specified diseases of intestine: Secondary | ICD-10-CM | POA: Diagnosis not present

## 2021-11-01 DIAGNOSIS — K7689 Other specified diseases of liver: Secondary | ICD-10-CM | POA: Diagnosis not present

## 2021-11-01 DIAGNOSIS — K4090 Unilateral inguinal hernia, without obstruction or gangrene, not specified as recurrent: Secondary | ICD-10-CM | POA: Diagnosis not present

## 2021-11-01 DIAGNOSIS — J432 Centrilobular emphysema: Secondary | ICD-10-CM | POA: Diagnosis not present

## 2021-11-01 DIAGNOSIS — I739 Peripheral vascular disease, unspecified: Secondary | ICD-10-CM | POA: Diagnosis not present

## 2021-11-01 DIAGNOSIS — I2510 Atherosclerotic heart disease of native coronary artery without angina pectoris: Secondary | ICD-10-CM | POA: Diagnosis not present

## 2021-11-01 DIAGNOSIS — Z85118 Personal history of other malignant neoplasm of bronchus and lung: Secondary | ICD-10-CM | POA: Diagnosis not present

## 2021-11-01 MED ORDER — IOHEXOL 300 MG/ML  SOLN
100.0000 mL | Freq: Once | INTRAMUSCULAR | Status: AC | PRN
  Administered 2021-11-01: 100 mL via INTRAVENOUS

## 2021-11-05 ENCOUNTER — Other Ambulatory Visit: Payer: Self-pay | Admitting: Physical Medicine and Rehabilitation

## 2021-11-05 DIAGNOSIS — M545 Low back pain, unspecified: Secondary | ICD-10-CM

## 2021-11-05 DIAGNOSIS — M5416 Radiculopathy, lumbar region: Secondary | ICD-10-CM | POA: Diagnosis not present

## 2021-11-07 ENCOUNTER — Inpatient Hospital Stay (HOSPITAL_BASED_OUTPATIENT_CLINIC_OR_DEPARTMENT_OTHER): Payer: PPO | Admitting: Internal Medicine

## 2021-11-07 ENCOUNTER — Encounter: Payer: Self-pay | Admitting: Internal Medicine

## 2021-11-07 ENCOUNTER — Inpatient Hospital Stay: Payer: PPO

## 2021-11-07 ENCOUNTER — Inpatient Hospital Stay: Payer: PPO | Attending: Internal Medicine

## 2021-11-07 ENCOUNTER — Telehealth: Payer: Self-pay

## 2021-11-07 ENCOUNTER — Telehealth: Payer: Self-pay | Admitting: *Deleted

## 2021-11-07 ENCOUNTER — Other Ambulatory Visit: Payer: Self-pay

## 2021-11-07 VITALS — BP 122/70 | HR 73 | Temp 98.7°F | Resp 18 | Wt 216.6 lb

## 2021-11-07 DIAGNOSIS — C3432 Malignant neoplasm of lower lobe, left bronchus or lung: Secondary | ICD-10-CM | POA: Diagnosis not present

## 2021-11-07 DIAGNOSIS — R5383 Other fatigue: Secondary | ICD-10-CM | POA: Insufficient documentation

## 2021-11-07 DIAGNOSIS — I2510 Atherosclerotic heart disease of native coronary artery without angina pectoris: Secondary | ICD-10-CM | POA: Diagnosis not present

## 2021-11-07 DIAGNOSIS — Z5112 Encounter for antineoplastic immunotherapy: Secondary | ICD-10-CM | POA: Diagnosis not present

## 2021-11-07 DIAGNOSIS — I739 Peripheral vascular disease, unspecified: Secondary | ICD-10-CM | POA: Diagnosis not present

## 2021-11-07 DIAGNOSIS — Z951 Presence of aortocoronary bypass graft: Secondary | ICD-10-CM | POA: Diagnosis not present

## 2021-11-07 LAB — CBC WITH DIFFERENTIAL/PLATELET
Abs Immature Granulocytes: 0.05 10*3/uL (ref 0.00–0.07)
Basophils Absolute: 0.1 10*3/uL (ref 0.0–0.1)
Basophils Relative: 1 %
Eosinophils Absolute: 0.3 10*3/uL (ref 0.0–0.5)
Eosinophils Relative: 4 %
HCT: 41.7 % (ref 39.0–52.0)
Hemoglobin: 14 g/dL (ref 13.0–17.0)
Immature Granulocytes: 1 %
Lymphocytes Relative: 13 %
Lymphs Abs: 1.3 10*3/uL (ref 0.7–4.0)
MCH: 31.4 pg (ref 26.0–34.0)
MCHC: 33.6 g/dL (ref 30.0–36.0)
MCV: 93.5 fL (ref 80.0–100.0)
Monocytes Absolute: 0.9 10*3/uL (ref 0.1–1.0)
Monocytes Relative: 10 %
Neutro Abs: 7 10*3/uL (ref 1.7–7.7)
Neutrophils Relative %: 71 %
Platelets: 177 10*3/uL (ref 150–400)
RBC: 4.46 MIL/uL (ref 4.22–5.81)
RDW: 13.1 % (ref 11.5–15.5)
WBC: 9.7 10*3/uL (ref 4.0–10.5)
nRBC: 0 % (ref 0.0–0.2)

## 2021-11-07 LAB — COMPREHENSIVE METABOLIC PANEL
ALT: 20 U/L (ref 0–44)
AST: 21 U/L (ref 15–41)
Albumin: 3.9 g/dL (ref 3.5–5.0)
Alkaline Phosphatase: 51 U/L (ref 38–126)
Anion gap: 6 (ref 5–15)
BUN: 23 mg/dL (ref 8–23)
CO2: 24 mmol/L (ref 22–32)
Calcium: 8.8 mg/dL — ABNORMAL LOW (ref 8.9–10.3)
Chloride: 105 mmol/L (ref 98–111)
Creatinine, Ser: 0.93 mg/dL (ref 0.61–1.24)
GFR, Estimated: >60 mL/min (ref >60)
Glucose, Bld: 116 mg/dL — ABNORMAL HIGH (ref 70–99)
Potassium: 4.1 mmol/L (ref 3.5–5.1)
Sodium: 135 mmol/L (ref 135–145)
Total Bilirubin: 0.6 mg/dL (ref 0.3–1.2)
Total Protein: 6.8 g/dL (ref 6.5–8.1)

## 2021-11-07 LAB — TSH: TSH: 1.48 u[IU]/mL (ref 0.350–4.500)

## 2021-11-07 MED ORDER — HEPARIN SOD (PORK) LOCK FLUSH 100 UNIT/ML IV SOLN
500.0000 [IU] | Freq: Once | INTRAVENOUS | Status: AC | PRN
  Administered 2021-11-07: 500 [IU]
  Filled 2021-11-07: qty 5

## 2021-11-07 MED ORDER — SODIUM CHLORIDE 0.9 % IV SOLN
Freq: Once | INTRAVENOUS | Status: AC
  Filled 2021-11-07: qty 250

## 2021-11-07 MED ORDER — SODIUM CHLORIDE 0.9 % IV SOLN
200.0000 mg | Freq: Once | INTRAVENOUS | Status: AC
  Administered 2021-11-07: 200 mg via INTRAVENOUS
  Filled 2021-11-07: qty 8

## 2021-11-07 NOTE — Telephone Encounter (Signed)
Patient called stating that he was to have gotten a call with his CT results today, but has not heard anything.  IMPRESSION: 1. Near complete interval resolution of previously described focal nodular area of consolidation within the superior segment left lower lobe. 2. Additional nodular area of consolidation superior segment left lower lobe is slightly decreased in size when compared to prior exam. 3. Interval development of patchy ground-glass opacities within the lingula and left upper lobe which may be infectious/inflammatory in etiology. 4. There is persistent marked circumferential wall thickening of the ascending and proximal transverse colon most focused about the hepatic flexure. While this may represent an infectious/inflammatory process, additional neoplastic etiologies are not excluded. Recommend clinical correlation as well as direct visualization with colonoscopy as clinically indicated given the persistent nature of this finding. 5. Similar right suprarenal nodule.     Electronically Signed   By: Lovey Newcomer M.D.   On: 11/02/2021 21:38

## 2021-11-07 NOTE — Telephone Encounter (Signed)
PA for EMLA cream submitted via cover my meds (Key: [id number])  Approved 01-JUN-23:30-AUG-23

## 2021-11-07 NOTE — Telephone Encounter (Signed)
Patient was seen by Dr. B this morning.

## 2021-11-07 NOTE — Progress Notes (Signed)
[facility] NOTE  Patient Care Team: Ria Bush, MD as PCP - General (Family Medicine) [name] [name], MD as PCP - Cardiology (Cardiology) [name], MD (Internal Medicine) Minna Merritts, MD as Consulting Physician (Cardiology) Pieter Partridge, DO as Consulting Physician (Neurology) Cammie Sickle, MD as Consulting Physician (Hematology and Oncology) Telford Nab, RN as Oncology Nurse Navigator  CHIEF COMPLAINTS/PURPOSE OF CONSULTATION: lung cancer    Oncology History Overview Note  #MAY 2022-Lung cancer-non-small cell [CT guided bx] T3N1 vs stage IV Dr.Hendrickson.  MRI brain negative for malignancy.# 1. April 2022- LLL ~4.0 cm mass in the superior segment left lower lobe abuts the major fissure and the posterior pleural surface without visible chest wall invasion without pleural effusion. There 2-3 other small nodules in the left lower lobe, largest measures 9 mm in diameter. These are suspicious for same lobe satellite lesions and there is left hilar adenopathy. Assuming non-small cell lung cancer the appearance is compatible with T3 N1 M0 disease (stage IIIA).  # right suprarenal nodule-awaiting biopsy on 5/31- non-small cell [revived at tumor conference at [place]  # June 9th 2022- CARBO-TAXOl-KEYTRUDA; Fulphila  # ? Subtle adrenal insufficiency- No acute process - -September MRI brain negative for any pituitary hypophysitis/brain metastasis. Prednisone   MOLECULAR TESTING: NGS TPS-PDL 100%; EXON 12 amplification* mutations.    # CAD [CABG 2016; Dr.Gollan]   Cancer of lower lobe of left lung ([place])  11/02/2020 Initial Diagnosis   Cancer of lower lobe of left lung ([place])   11/02/2020 Cancer Staging   Staging form: Lung, AJCC 8th Edition - Clinical: Stage IVA (cT3, cN1, cM1a) - Signed by Cammie Sickle, MD on 11/02/2020    11/16/2020 -  Chemotherapy   Patient is on Treatment Plan : LUNG NSCLC Carboplatin + Paclitaxel +  Pembrolizumab q21d x 4 cycles / Pembrolizumab Maintenance Q21D         HISTORY OF PRESENTING ILLNESS: Alone. walking independently.  Randall Ali. 81 y.o.  male lung cancer-non-small cell stage IV [supra-renal nodule s/p biopsy] currently on Keytruda is for follow-up-reviewed results of the CT scan.  In the interim patient was evaluated in [place] for back pain.  S/p injections.  Currently awaiting MRI.  Pain improved.  Continues to have ongoing fatigue needing IV fluids./Dexamethasone.  Denies any tingling or numbness in the extremities. No nausea no vomiting no headaches.  Review of Systems  Constitutional:  Positive for malaise/fatigue. Negative for chills, diaphoresis, fever and weight loss.  HENT:  Negative for nosebleeds and sore throat.   Eyes:  Negative for double vision.  Respiratory:  Negative for cough, hemoptysis, sputum production, shortness of breath and wheezing.   Cardiovascular:  Negative for chest pain, palpitations, orthopnea and leg swelling.  Gastrointestinal:  Positive for nausea. Negative for abdominal pain, blood in stool, constipation, diarrhea, heartburn, melena and vomiting.  Genitourinary:  Negative for dysuria, frequency and urgency.  Musculoskeletal:  Positive for back pain and joint pain.  Skin: Negative.  Negative for itching and rash.  Neurological:  Negative for dizziness, tingling, focal weakness, weakness and headaches.  Endo/Heme/Allergies:  Does not bruise/bleed easily.  Psychiatric/Behavioral:  Negative for depression. The patient is not nervous/anxious and does not have insomnia.     MEDICAL HISTORY:  Past Medical History:  Diagnosis Date  . Allergy    seasonal  . Arthritis    all over- in general   . CAD (coronary artery disease)    a. inferior wall  MI 10/01 s/p PCI/DES to RCA; b. Myoview 3/16 neg for ischemia; c. LHC 8/16: ostLAD 80%, OM1 70%, OM2 70% x 2 lesions, mRCA 30%, dRCA 70% s/p 4-V CABG 01/24/15 (LIMA-LAD, VG- OM1, VG-OM2,  VG-PDA)   . Cancer (HCC)    skin, melanoma  . Carotid artery disease ([place])    a. [place] 8/16: 1-39% bilateral ICA stenosis  . Cataract    removed  . Diastolic dysfunction    a. TTE 8/16: EF 55-60%, no RWMA, Gr1DD, calcified mitral annulus, mild biatrial enlargement  . Erectile dysfunction   . GERD (gastroesophageal reflux disease)   . History of elbow surgery   . History of hiatal hernia   . HLD (hyperlipidemia)   . HTN (hypertension)   . 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 Lung cancer ([place])   . Postoperative wound infection 02/02/2015  . Reflux esophagitis   . Sleep apnea 2017   CPAP at night    SURGICAL HISTORY: Past Surgical History:  Procedure Laterality Date  . arm surgery  2010  . BROW LIFT Bilateral 11/25/2019   Procedure: BROW PTOSIS REPAIR BILATERAL;  Surgeon: Karle Starch, MD;  Location: [facility];  Service: Ophthalmology;  Laterality: Bilateral;  sleep apnea  . CARDIAC CATHETERIZATION  06/24/2011  . CARDIAC CATHETERIZATION N/A 01/18/2015   Procedure: Left Heart Cath with coronary angiography;  Surgeon: Minna Merritts, MD;  Location: [facility];  Service: Cardiovascular;  Laterality: N/A;  . CARDIAC CATHETERIZATION N/A 01/18/2015   Procedure: Intravascular Pressure Wire/FFR Study;  Surgeon: [name], MD;  Location: [facility];  Service: Cardiovascular;  Laterality: N/A;  . CAROTID STENT  03/10/2011  . COLONOSCOPY  2010  . COLONOSCOPY  06/14/2014   Dr Hilarie Fredrickson  . CORONARY ARTERY BYPASS GRAFT N/A 01/24/2015   Procedure: CORONARY ARTERY BYPASS GRAFTING x 4 (LIMA-LAD, SVG-Int 1- Int 2, SVG-PD) ENDOSCOPIC GREATER SAPHENOUS VEIN HARVEST LEFT LEG;  Surgeon: [name], MD;  Location: [facility];  Service: Open Heart Surgery;  Laterality: N/A;  . EMBOLECTOMY  06/15/2019   Procedure: EMBOLECTOMY;  Surgeon: Katha Cabal, MD;  Location: [facility];  Service: Vascular;;  right superficial femoral artery  .  ENDARTERECTOMY FEMORAL Right 06/15/2019   Procedure: ENDARTERECTOMY FEMORAL;  Surgeon: Katha Cabal, MD;  Location: [facility];  Service: Vascular;  Laterality: Right;  common femoral profunda femoris superficial femoral  . ESOPHAGOGASTRODUODENOSCOPY (EGD) WITH PROPOFOL N/A 04/24/2016   Procedure: ESOPHAGOGASTRODUODENOSCOPY (EGD) WITH PROPOFOL;  Surgeon: Jerene Bears, MD;  Location: [facility];  Service: Gastroenterology;  Laterality: N/A;  . EYE SURGERY     lasik 15 yrs. ago, cataracts removed - both eyes   . HAMMER TOE SURGERY     right toe  . INSERTION OF ILIAC STENT Right 06/15/2019   Procedure: INSERTION OF ILIAC STENT ( STENTING OF SFA/POP ARTERY );  Surgeon: Katha Cabal, MD;  Location: [facility];  Service: Vascular;  Laterality: Right;  angioplpasty and stent placement: right superficial femoral right tibiopopliteal trunk bilateral common iliac arteries  . IR IMAGING GUIDED PORT INSERTION  11/09/2020  . LEFT HEART CATH AND CORONARY ANGIOGRAPHY Left 06/10/2017   Procedure: LEFT HEART CATH AND CORONARY ANGIOGRAPHY;  Surgeon: Minna Merritts, MD;  Location: [facility];  Service: Cardiovascular;  Laterality: Left;  . LOWER EXTREMITY ANGIOGRAPHY Left 01/04/2019   Procedure: LOWER EXTREMITY ANGIOGRAPHY;  Surgeon: Katha Cabal, MD;  Location: [facility];  Service: Cardiovascular;  Laterality: Left;  . LOWER EXTREMITY ANGIOGRAPHY Right 01/25/2019   Procedure: LOWER EXTREMITY ANGIOGRAPHY;  Surgeon: Katha Cabal, MD;  Location: [facility];  Service: Cardiovascular;  Laterality: Right;  . LOWER EXTREMITY ANGIOGRAPHY Right 01/15/2021   LOWER EXTREMITY ANGIOGRAPHY and stent placement to R SFA and popliteal artery (Schnier, Dolores Lory, MD)  . NASAL SINUS SURGERY  2008   septpolasty, bilateral turbinate reduction  . SHOULDER ARTHROSCOPY  2012  . TEE WITHOUT CARDIOVERSION N/A 01/24/2015   Procedure: TRANSESOPHAGEAL ECHOCARDIOGRAM (TEE);   Surgeon: [name], MD;  Location: [facility];  Service: Open Heart Surgery;  Laterality: N/A;  . [place]  . UPPER GI ENDOSCOPY  07/2014, 04-24-16   Dr Raquel James  . WRIST SURGERY  2011    SOCIAL HISTORY: Social History   Socioeconomic History  . Marital status: Single    Spouse name: Not on file  . Number of children: Not on file  . Years of education: 76  . Highest education level: Not on file  Occupational History  . Occupation: retired    Comment: Agricultural consultant  Tobacco Use  . Smoking status: Former    Packs/day: 2.50    Years: 40.00    Pack years: 100.00    Types: Cigarettes    Quit date: 03/24/2000    Years since quitting: 21.6  . Smokeless tobacco: Never  Vaping Use  . Vaping Use: Never used  Substance and Sexual Activity  . Alcohol use: Yes    Comment: occassionally  . Drug use: No  . Sexual activity: Yes  Other Topics Concern  . Not on file  Social History Narrative   Singled; lives with son and dog    Occ: retired, back part time at [employer];    Activity: gym 4-5d/wk   Diet: good water, fruits/vegetables daily   Caffeine Use-yes      ------------------------------------       Car sales- retd; [employer]- retd; quit smoking 2001. Alcohol couple nights a week. Live in [place]. Daughter lives 10 mins.    Social Determinants of Health   Financial Resource Strain: Low Risk   . Difficulty of Paying Living Expenses: Not hard at all  Food Insecurity: No Food Insecurity  . Worried About Charity fundraiser in the Last Year: Never true  . Ran Out of Food in the Last Year: Never true  Transportation Needs: No Transportation Needs  . Lack of Transportation (Medical): No  . Lack of Transportation (Non-Medical): No  Physical Activity: Sufficiently Active  . Days of Exercise per Week: 5 days  . Minutes of Exercise per Session: 30 min  Stress: No Stress Concern Present  . Feeling of Stress : Only a little  Social Connections: Socially Isolated  .  Frequency of Communication with Friends and Family: Twice a week  . Frequency of Social Gatherings with Friends and Family: Twice a week  . Attends Religious Services: Never  . Active Member of Clubs or Organizations: Not on file  . Attends Archivist Meetings: Never  . Marital Status: Never married  Intimate Partner Violence: Not At Risk  . Fear of Current or Ex-Partner: No  . Emotionally Abused: No  . Physically Abused: No  . Sexually Abused: No    FAMILY HISTORY: Family History  Problem Relation Age of Onset  . Hypertension Mother   . Heart disease Mother   . Hypertension Father   . Diabetes Father   .  Lymphoma Sister   . Heart disease Brother 49  . Cancer Paternal Grandfather   . Colon cancer Neg Hx   . Prostate cancer Neg Hx   . Bladder Cancer Neg Hx   . Kidney cancer Neg Hx     ALLERGIES:  is allergic to flomax [tamsulosin].  MEDICATIONS:  Current Outpatient Medications  Medication Sig Dispense Refill  . acetaminophen (TYLENOL) 325 MG tablet Take 650 mg by mouth as needed for moderate pain or mild pain.    [name] albuterol (VENTOLIN HFA) 108 (90 Base) MCG/ACT inhaler INHALE 2 PUFFS INTO THE LUNGS EVERY 6 HOURS AS NEEDED FOR WHEEZING OR SHORTNESS OF BREATH 8.5 g 2  . Ascorbic Acid (VITAMIN C) 1000 MG tablet Take 1,000 mg by mouth daily.    [name] aspirin 81 MG EC tablet Take 81 mg by mouth daily.      . Calcium-Magnesium-Vitamin D (CALCIUM 1200+D3 PO) Take 1 tablet by mouth daily.    . Carboxymethylcellul-Glycerin (LUBRICATING EYE DROPS OP) Place 1 drop into both eyes daily as needed (irritation).    . Cholecalciferol (VITAMIN D3) 50 MCG (2000 UT) TABS Take 2,000 Units by mouth daily.    . Cyanocobalamin (B-12) 5000 MCG CAPS Take 5,000 mcg by mouth daily.    [name] docusate sodium (COLACE) 100 MG capsule Take 200 mg by mouth at bedtime.    . fluticasone (FLONASE) 50 MCG/ACT nasal spray Place into both nostrils.    [name] lidocaine-prilocaine (EMLA) cream Apply 30 -45 mins prior  to port access. 30 g 3  . loratadine (CLARITIN) 10 MG tablet Take 10 mg by mouth daily.    . Melatonin 10 MG CAPS Take 10 mg by mouth at bedtime as needed (sleep).    . methylphenidate (RITALIN) 5 MG tablet Take 1 tablet (5 mg total) by mouth daily. 15 tablet 0  . nitroGLYCERIN (NITROSTAT) 0.4 MG SL tablet Place 1 tablet (0.4 mg total) under the tongue every 5 (five) minutes as needed for up to 25 doses for chest pain. 25 tablet 1  . pantoprazole (PROTONIX) 40 MG tablet TAKE 1 TABLET BY MOUTH DAILY 90 tablet 3  . prochlorperazine (COMPAZINE) 10 MG tablet Take 1 tablet (10 mg total) by mouth every 6 (six) hours as needed for nausea or vomiting. 40 tablet 1  . simvastatin (ZOCOR) 40 MG tablet Take 1 tablet (40 mg total) by mouth at bedtime. 90 tablet 3  . traMADol (ULTRAM) 50 MG tablet TAKE 1 TABLET BY MOUTH 3 TIMES DAILY AS NEEDED FOR MODERATE PAIN 90 tablet 0  . triamcinolone ointment (KENALOG) 0.5 %     . valsartan (DIOVAN) 80 MG tablet One daily 30 tablet 11  . Vitamin E 450 MG (1000 UT) CAPS Take 450 Units by mouth daily.    . Zinc 50 MG TABS Take 50 mg by mouth daily.     No current facility-administered medications for this visit.   Facility-Administered Medications Ordered in Other Visits  Medication Dose Route Frequency Provider Last Rate Last Admin  . heparin lock flush 100 UNIT/ML injection               .  PHYSICAL EXAMINATION: ECOG PERFORMANCE STATUS: 0 - Asymptomatic  Vitals:   11/07/21 1100  BP: 122/70  Pulse: 73  Resp: 18  Temp: 98.7 F (37.1 C)      Filed Weights   11/07/21 1100  Weight: 216 lb 9.6 oz (98.2 kg)        Physical Exam HENT:  Head: Normocephalic and atraumatic.     Mouth/Throat:     Pharynx: No oropharyngeal exudate.  Eyes:     Pupils: Pupils are equal, round, and reactive to light.  Cardiovascular:     Rate and Rhythm: Normal rate and regular rhythm.  Pulmonary:     Effort: No respiratory distress.     Breath sounds: No  wheezing.     Comments: Decreased air entry bilaterally. Abdominal:     General: Bowel sounds are normal. There is no distension.     Palpations: Abdomen is soft. There is no mass.     Tenderness: There is no abdominal tenderness. There is no guarding or rebound.  Musculoskeletal:        General: No tenderness. Normal range of motion.     Cervical back: Normal range of motion and neck supple.  Skin:    General: Skin is warm.  Neurological:     Mental Status: He is alert and oriented to person, place, and time.  Psychiatric:        Mood and Affect: Affect normal.     LABORATORY DATA:  I have reviewed the data as listed Lab Results  Component Value Date   WBC 9.7 11/07/2021   HGB 14.0 11/07/2021   HCT 41.7 11/07/2021   MCV 93.5 11/07/2021   PLT 177 11/07/2021   Recent Labs    10/17/21 0945 10/24/21 1006 10/29/21 1300 11/07/21 1021  NA 135 136 138 135  K 4.2 3.8 3.9 4.1  CL 106 107 106 105  CO2 23 23 26 24   GLUCOSE 108* 94 100* 116*  BUN 26* 20 17 23   CREATININE 1.06 1.01 0.99 0.93  CALCIUM 8.9 8.8* 9.0 8.8*  GFRNONAA >60 >60 >60 >60  PROT 6.7 6.5  --  6.8  ALBUMIN 4.0 3.8  --  3.9  AST 19 16  --  21  ALT 18 16  --  20  ALKPHOS 54 53  --  51  BILITOT 0.8 0.7  --  0.6    RADIOGRAPHIC STUDIES: I have personally reviewed the radiological images as listed and agreed with the findings in the report. CT CHEST ABDOMEN PELVIS W CONTRAST  Result Date: 11/02/2021 CLINICAL DATA:  History of non-small cell lung cancer. Assess response. * Tracking Code: BO * EXAM: CT CHEST, ABDOMEN, AND PELVIS WITH CONTRAST TECHNIQUE: Multidetector CT imaging of the chest, abdomen and pelvis was performed following the standard protocol during bolus administration of intravenous contrast. RADIATION DOSE REDUCTION: This exam was performed according to the departmental dose-optimization program which includes automated exposure control, adjustment of the mA and/or kV according to patient size  and/or use of iterative reconstruction technique. CONTRAST:  158mL OMNIPAQUE IOHEXOL 300 MG/ML  SOLN COMPARISON:  CT C AP July 22, 2021. FINDINGS: CT CHEST FINDINGS Cardiovascular: Right anterior chest wall Port-A-Cath is present with tip terminating in the superior vena cava. The heart is normal in size. Aortic atherosclerosis. Coronary arterial vascular calcifications. Mediastinum/Nodes: No enlarged axillary, mediastinal or hilar lymphadenopathy. Mild circumferential wall thickening of the esophagus. Lungs/Pleura: Central airways are patent. Rounded subpleural nodular opacity within the left lower lobe measures 1.3 x 1.1 cm (image 78; series 3), previously 1.9 x 1.5 cm. Near complete resolution of additional focus of consolidation within the left lower lobe most compatible with resolved infectious process. Patchy ground-glass opacities within the lingula and left upper lobe, somewhat new from prior (image 67; series 3). Centrilobular and paraseptal emphysematous change. Unchanged 6 mm nodule right  upper lobe (image 62; series 3). No pleural effusion or pneumothorax. Musculoskeletal: Thoracic spine degenerative changes. Prior median sternotomy. No aggressive or acute appearing osseous lesions. CT ABDOMEN PELVIS FINDINGS Hepatobiliary: Liver is normal in size and contour. Unchanged small hepatic cysts. Gallbladder is unremarkable. No intrahepatic or extrahepatic biliary ductal dilatation. Pancreas: Unremarkable Spleen: Unremarkable Adrenals/Urinary Tract: Normal adrenal glands. Unchanged 12 mm nodule adjacent to the superior pole right kidney (image 64; series 2). Punctate left nephrolithiasis. Urinary bladder is unremarkable. Stomach/Bowel: Stool throughout the colon. Redemonstrated circumferential wall thickening of the ascending and proximal transverse colon most focus at the hepatic flexure. The appendix is normal in appearance. No evidence for small bowel obstruction. No free fluid or free intraperitoneal  air. Normal morphology of the stomach. Vascular/Lymphatic: Normal caliber abdominal aorta. Peripheral calcified atherosclerotic plaque. No retroperitoneal lymphadenopathy. Reproductive: Heterogeneous prostate. Other: Small fat containing left inguinal hernia. Musculoskeletal: Lumbar spine degenerative changes. No aggressive or acute appearing osseous lesions. IMPRESSION: 1. Near complete interval resolution of previously described focal nodular area of consolidation within the superior segment left lower lobe. 2. Additional nodular area of consolidation superior segment left lower lobe is slightly decreased in size when compared to prior exam. 3. Interval development of patchy ground-glass opacities within the lingula and left upper lobe which may be infectious/inflammatory in etiology. 4. There is persistent marked circumferential wall thickening of the ascending and proximal transverse colon most focused about the hepatic flexure. While this may represent an infectious/inflammatory process, additional neoplastic etiologies are not excluded. Recommend clinical correlation as well as direct visualization with colonoscopy as clinically indicated given the persistent nature of this finding. 5. Similar right suprarenal nodule. Electronically Signed   By: Lovey Newcomer M.D.   On: 11/02/2021 21:38   DG HIP UNILAT W OR W/O PELVIS 2-3 VIEWS LEFT  Result Date: 10/20/2021 CLINICAL DATA:  Bilateral hip pain for 3-4 months. EXAM: DG HIP (WITH OR WITHOUT PELVIS) 2-3V RIGHT; DG HIP (WITH OR WITHOUT PELVIS) 2-3V LEFT COMPARISON:  None Available. FINDINGS: There is diffuse decreased bone mineralization. Mild bilateral superolateral acetabular degenerative osteophytosis. Mild bilateral inferior sacroiliac joint subchondral sclerosis without significant joint space narrowing. Mild superior pubic symphysis joint space narrowing, subchondral sclerosis and superior degenerative spurring. Within the limitations of diffuse decreased  bone mineralization, no acute fracture is seen. Moderate vascular calcifications. IMPRESSION: 1. Mild bilateral femoroacetabular osteoarthritis. 2. Mild pubic symphysis osteoarthritis. Electronically Signed   By: Yvonne Kendall M.D.   On: 10/20/2021 16:12   DG HIP UNILAT W OR W/O PELVIS 2-3 VIEWS RIGHT  Result Date: 10/20/2021 CLINICAL DATA:  Bilateral hip pain for 3-4 months. EXAM: DG HIP (WITH OR WITHOUT PELVIS) 2-3V RIGHT; DG HIP (WITH OR WITHOUT PELVIS) 2-3V LEFT COMPARISON:  None Available. FINDINGS: There is diffuse decreased bone mineralization. Mild bilateral superolateral acetabular degenerative osteophytosis. Mild bilateral inferior sacroiliac joint subchondral sclerosis without significant joint space narrowing. Mild superior pubic symphysis joint space narrowing, subchondral sclerosis and superior degenerative spurring. Within the limitations of diffuse decreased bone mineralization, no acute fracture is seen. Moderate vascular calcifications. IMPRESSION: 1. Mild bilateral femoroacetabular osteoarthritis. 2. Mild pubic symphysis osteoarthritis. Electronically Signed   By: Yvonne Kendall M.D.   On: 10/20/2021 16:12     ASSESSMENT & PLAN:   Cancer of lower lobe of left lung ([place]) # LLL- Lung cancer-non-small cell stage IV [right suprarenal nodule-s/p biopsy "non-small cell ca"].  S/p  CarboTaxol plus Keytruda x4; currently on single agent Keytruda-  MAY 27th, 2023-l resolution of previously  described focal nodular area of consolidation within the superior segment left lower Lobe; Additional nodular area of consolidation superior segment left lower lobe is slightly decreased in size when compared to prior exam.  Similar right suprarenal nodule.  #Continue Keytruda at this time. Labs today reviewed;  acceptable for treatment today.   # CT MAY 27th, 2023-incidental-  Interval development of patchy ground-glass opacities within the lingula and left upper lobe which may be infectious/inflammatory  in etiology.  Monitor for now.  We will repeat imaging in 3 months.  # CT MAY 2023- persistent marked circumferential wall thickening of the ascending and proximal transverse colon most focused about the hepatic flexure.  Patient is asymptomatic.  Last colonoscopy 3 to 4 years ago as per patient.  Will refer to GI/Dr.[name]; [place] for further evaluation.  # Acute on chronic back pain- BIL radiating lower extremitie- s/p evaluation [[place]]; awaiting MRI-  - on tramadol 50 mg BID prn.   # Fatigue: Likely secondary to immunotherapy no endocrine dysfunction noted.  On prendisone/intermittent dexamethasone.  # CAD-s/p CABG-/PVD-s/p stent [Dr.Schneir]-improvement in pain noted.  On Plavix --STABLE;  # IV access: Mediport functioning.  # DISPOSITION: # refer to Dr.[name]- GI doc [[place]] re: right colonic thickening.   # Keytruda today;  # 1 week-labs- cbc/bmp possible   IVfs 1lit/dex  # Follow up in 3 weeks- MD; labs- cbc/cmp;Keytruda;  Dr.B  # I reviewed the blood work- with the patient in detail; also reviewed the imaging independently [as summarized above]; and with the patient in detail.      All  questions were answered. The patient knows to call the clinic with any problems, questions or concerns.    Cammie Sickle, MD 11/07/2021 1:31 PM

## 2021-11-07 NOTE — Assessment & Plan Note (Addendum)
#   LLL- Lung cancer-non-small cell stage IV [right suprarenal nodule-s/p biopsy "non-small cell ca"].  S/p  CarboTaxol plus Keytruda x4; currently on single agent Keytruda-  MAY 27th, 2023-l resolution of previously described focal nodular area of consolidation within the superior segment left lower Lobe; Additional nodular area of consolidation superior segment left lower lobe is slightly decreased in size when compared to prior exam.  Similar right suprarenal nodule.  #Continue Keytruda at this time. Labs today reviewed;  acceptable for treatment today.   # CT MAY 27th, 2023-incidental-  Interval development of patchy ground-glass opacities within the lingula and left upper lobe which may be infectious/inflammatory in etiology.  Monitor for now.  We will repeat imaging in 3 months.  # CT MAY 2023- persistent marked circumferential wall thickening of the ascending and proximal transverse colon most focused about the hepatic flexure.  Patient is asymptomatic.  Last colonoscopy 3 to 4 years ago as per patient.  Will refer to GI/Dr.[name]; [place] for further evaluation.  # Acute on chronic back pain- BIL radiating lower extremitie- s/p evaluation [[place]]; awaiting MRI-  - on tramadol 50 mg BID prn.   # Fatigue: Likely secondary to immunotherapy no endocrine dysfunction noted.  On prendisone/intermittent dexamethasone.  # CAD-s/p CABG-/PVD-s/p stent [Dr.Schneir]-improvement in pain noted.  On Plavix --STABLE;  # IV access: Mediport functioning.  # DISPOSITION: # refer to Dr.[name]- GI doc [[place]] re: right colonic thickening.   # Keytruda today;  # 1 week-labs- cbc/bmp possible   IVfs 1lit/dex  # Follow up in 3 weeks- MD; labs- cbc/cmp;Keytruda;  Dr.B  # I reviewed the blood work- with the patient in detail; also reviewed the imaging independently [as summarized above]; and with the patient in detail.

## 2021-11-07 NOTE — Progress Notes (Signed)
Patient denies new problems/concerns today.    Does not want to do treatment week of 6/20 due to going out of town.  Was seen by [facility] on 5/30 and received 2 hip injections.  They have scheduled MRI on 11/17/21 at [facility] ([facility] in [place].  Would like to discuss medication to help control urinary retention.  Has previously tried Flomax with no help.

## 2021-11-07 NOTE — Patient Instructions (Signed)
[facility] CANCER CTR AT [place]  Discharge Instructions: Thank you for choosing [facility] to provide your oncology and hematology care.  If you have a lab appointment with the [facility], please go directly to the [facility] and check in at the registration area.  Wear comfortable clothing and clothing appropriate for easy access to any Portacath or PICC line.   We strive to give you quality time with your provider. You may need to reschedule your appointment if you arrive late (15 or more minutes).  Arriving late affects you and other patients whose appointments are after yours.  Also, if you miss three or more appointments without notifying the office, you may be dismissed from the clinic at the provider's discretion.      For prescription refill requests, have your pharmacy contact our office and allow 72 hours for refills to be completed.    Today you received the following chemotherapy and/or immunotherapy agents: Keytruda      To help prevent nausea and vomiting after your treatment, we encourage you to take your nausea medication as directed.  BELOW ARE SYMPTOMS THAT SHOULD BE REPORTED IMMEDIATELY: *FEVER GREATER THAN 100.4 F (38 C) OR HIGHER *CHILLS OR SWEATING *NAUSEA AND VOMITING THAT IS NOT CONTROLLED WITH YOUR NAUSEA MEDICATION *UNUSUAL SHORTNESS OF BREATH *UNUSUAL BRUISING OR BLEEDING *URINARY PROBLEMS (pain or burning when urinating, or frequent urination) *BOWEL PROBLEMS (unusual diarrhea, constipation, pain near the anus) TENDERNESS IN MOUTH AND THROAT WITH OR WITHOUT PRESENCE OF ULCERS (sore throat, sores in mouth, or a toothache) UNUSUAL RASH, SWELLING OR PAIN  UNUSUAL VAGINAL DISCHARGE OR ITCHING   Items with * indicate a potential emergency and should be followed up as soon as possible or go to the Emergency Department if any problems should occur.  Please show the CHEMOTHERAPY ALERT CARD or IMMUNOTHERAPY ALERT CARD at check-in to  the Emergency Department and triage nurse.  Should you have questions after your visit or need to cancel or reschedule your appointment, please contact [facility] CANCER [place] AT [place]  [phone] and follow the prompts.  Office hours are 8:00 a.m. to 4:30 p.m. Monday - Friday. Please note that voicemails left after 4:00 p.m. may not be returned until the following business day.  We are closed weekends and major holidays. You have access to a nurse at all times for urgent questions. Please call the main number to the clinic [phone] and follow the prompts.  For any non-urgent questions, you may also contact your provider using MyChart. We now offer e-Visits for anyone 48 and older to request care online for non-urgent symptoms. For details visit [email or web address].   Also download the MyChart app! Go to the app store, search "MyChart", open the app, select [facility], and log in with your MyChart username and password.  Due to Covid, a mask is required upon entering the hospital/clinic. If you do not have a mask, one will be given to you upon arrival. For doctor visits, patients may have 1 support person aged 29 or older with them. For treatment visits, patients cannot have anyone with them due to current Covid guidelines and our immunocompromised population.

## 2021-11-08 ENCOUNTER — Encounter: Payer: Self-pay | Admitting: Internal Medicine

## 2021-11-11 ENCOUNTER — Encounter: Payer: Self-pay | Admitting: Family Medicine

## 2021-11-11 ENCOUNTER — Ambulatory Visit (INDEPENDENT_AMBULATORY_CARE_PROVIDER_SITE_OTHER): Payer: PPO | Admitting: Family Medicine

## 2021-11-11 VITALS — BP 126/78 | HR 73 | Temp 97.8°F | Ht 68.0 in | Wt 218.4 lb

## 2021-11-11 DIAGNOSIS — R3914 Feeling of incomplete bladder emptying: Secondary | ICD-10-CM | POA: Diagnosis not present

## 2021-11-11 DIAGNOSIS — N401 Enlarged prostate with lower urinary tract symptoms: Secondary | ICD-10-CM

## 2021-11-11 DIAGNOSIS — R350 Frequency of micturition: Secondary | ICD-10-CM

## 2021-11-11 LAB — POC URINALSYSI DIPSTICK (AUTOMATED)
Bilirubin, UA: NEGATIVE
Blood, UA: NEGATIVE
Glucose, UA: NEGATIVE
Ketones, UA: NEGATIVE
Leukocytes, UA: NEGATIVE
Nitrite, UA: NEGATIVE
Protein, UA: NEGATIVE
Spec Grav, UA: 1.025 (ref 1.010–1.025)
Urobilinogen, UA: 0.2 E.U./dL
pH, UA: 5.5 (ref 5.0–8.0)

## 2021-11-11 MED ORDER — TAMSULOSIN HCL 0.4 MG PO CAPS: 0.4000 mg | ORAL_CAPSULE | Freq: Every day | ORAL | 3 refills | Status: DC

## 2021-11-11 MED ORDER — TRAMADOL HCL 50 MG PO TABS: 50.0000 mg | ORAL_TABLET | Freq: Three times a day (TID) | ORAL | 0 refills | Status: DC | PRN

## 2021-11-11 NOTE — Assessment & Plan Note (Signed)
IPSS = 27-6, severe.  Pt states flomax previously effectively treated symptoms and he desires to restart this. Discussed caution with orthostasis - he thinks BP meds actually were culprits of prior symptoms, not flomax. Will cautiously restart flomax 0.4mg  nightly. Update with effect. If not effective, consider starting 5a reductase inhibitor.

## 2021-11-11 NOTE — Patient Instructions (Addendum)
Restart flomax, let us know how you do with this. Watch for recurrent dizziness especially with position changes.  Good to see you today. Call us with questions.

## 2021-11-11 NOTE — Progress Notes (Signed)
Patient ID: Randall Ali., male    DOB: [birth year], 81 y.o.   MRN: [id number]  This visit was conducted in person.  BP 126/78   Pulse 73   Temp 97.8 F (36.6 C) (Temporal)   Ht 5\' 8"  (1.727 m)   Wt 218 lb 6 oz (99.1 kg)   SpO2 92%   BMI 33.20 kg/m    Orthostatic VS for the past 24 hrs (Last 3 readings):  BP- Lying BP- Standing at 3 minutes  11/11/21 1432 -- 126/64  11/11/21 1429 134/66 --   CC: longstanding urinary issues Subjective:   HPI: [name] is a 81 y.o. male presenting on 11/11/2021 for Urinary Frequency (C/o have to urinate often.  Also, c/o of urge to go, will urinate only a little, then have to go again.  Sxs started about 6 mo ago. Denies any other urinary sxs. )   Stage IV lung cancer dx 10/2020, on maintenance Keytruda through oncology. Most recently imaging showed circumferential thickening of ascending and proximal transverse colon - has been referred back to GI for further evaluation. 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   Saw Dr Melvyn Novas 2 wks ago - lisinopril changed to valsartan, rec continued pantoprazole 40mg  daily.   6 mo h/o recurrent urinary symptoms of incomplete emptying, intermittency, weak stream and straining. He stops drinking water after 2pm - and notes no significant nocturia. Flomax previously helped these symptoms however it was stopped due to possible increased dizziness. He desires to restart.   Denies fevers/chills, dysuria, hematuria, urgency  Lab Results  Component Value Date   PSA 0.27 01/06/2019   PSA 0.39 08/24/2017   PSA 0.37 07/14/2016       Relevant past medical, surgical, family and social history reviewed and updated as indicated. Interim medical history since our last visit reviewed. Allergies and medications reviewed and updated. Outpatient Medications Prior to Visit  Medication Sig Dispense Refill   acetaminophen (TYLENOL) 325 MG tablet Take 650 mg by mouth as needed for moderate pain or mild  pain.     albuterol (VENTOLIN HFA) 108 (90 Base) MCG/ACT inhaler INHALE 2 PUFFS INTO THE LUNGS EVERY 6 HOURS AS NEEDED FOR WHEEZING OR SHORTNESS OF BREATH 8.5 g 2   Ascorbic Acid (VITAMIN C) 1000 MG tablet Take 1,000 mg by mouth daily.     aspirin 81 MG EC tablet Take 81 mg by mouth daily.       Calcium-Magnesium-Vitamin D (CALCIUM 1200+D3 PO) Take 1 tablet by mouth daily.     Carboxymethylcellul-Glycerin (LUBRICATING EYE DROPS OP) Place 1 drop into both eyes daily as needed (irritation).     Cholecalciferol (VITAMIN D3) 50 MCG (2000 UT) TABS Take 2,000 Units by mouth daily.     Cyanocobalamin (B-12) 5000 MCG CAPS Take 5,000 mcg by mouth daily.     docusate sodium (COLACE) 100 MG capsule Take 200 mg by mouth at bedtime.     fluticasone (FLONASE) 50 MCG/ACT nasal spray Place into both nostrils.     lidocaine-prilocaine (EMLA) cream Apply 30 -45 mins prior to port access. 30 g 3   loratadine (CLARITIN) 10 MG tablet Take 10 mg by mouth daily.     Melatonin 10 MG CAPS Take 10 mg by mouth at bedtime as needed (sleep).     methylphenidate (RITALIN) 5 MG tablet Take 1 tablet (5 mg total) by mouth daily. 15 tablet 0   nitroGLYCERIN (NITROSTAT) 0.4 MG SL tablet Place 1 tablet (  0.4 mg total) under the tongue every 5 (five) minutes as needed for up to 25 doses for chest pain. 25 tablet 1   pantoprazole (PROTONIX) 40 MG tablet TAKE 1 TABLET BY MOUTH DAILY 90 tablet 3   prochlorperazine (COMPAZINE) 10 MG tablet Take 1 tablet (10 mg total) by mouth every 6 (six) hours as needed for nausea or vomiting. 40 tablet 1   simvastatin (ZOCOR) 40 MG tablet Take 1 tablet (40 mg total) by mouth at bedtime. 90 tablet 3   triamcinolone ointment (KENALOG) 0.5 %      valsartan (DIOVAN) 80 MG tablet One daily 30 tablet 11   Vitamin E 450 MG (1000 UT) CAPS Take 450 Units by mouth daily.     Zinc 50 MG TABS Take 50 mg by mouth daily.     traMADol (ULTRAM) 50 MG tablet TAKE 1 TABLET BY MOUTH 3 TIMES DAILY AS NEEDED FOR  MODERATE PAIN 90 tablet 0   Facility-Administered Medications Prior to Visit  Medication Dose Route Frequency Provider Last Rate Last Admin   heparin lock flush 100 UNIT/ML injection              Per HPI unless specifically indicated in ROS section below Review of Systems  Objective:  BP 126/78   Pulse 73   Temp 97.8 F (36.6 C) (Temporal)   Ht 5\' 8"  (1.727 m)   Wt 218 lb 6 oz (99.1 kg)   SpO2 92%   BMI 33.20 kg/m   Wt Readings from Last 3 Encounters:  11/11/21 218 lb 6 oz (99.1 kg)  11/07/21 216 lb 9.6 oz (98.2 kg)  10/31/21 219 lb (99.3 kg)      Physical Exam Vitals and nursing note reviewed.  Constitutional:      Appearance: Normal appearance. He is not ill-appearing.  HENT:     Head: Normocephalic and atraumatic.     Mouth/Throat:     Mouth: Mucous membranes are moist.     Pharynx: Oropharynx is clear. No oropharyngeal exudate or posterior oropharyngeal erythema.  Cardiovascular:     Rate and Rhythm: Normal rate and regular rhythm.     Pulses: Normal pulses.     Heart sounds: Normal heart sounds. No murmur heard. Pulmonary:     Effort: Pulmonary effort is normal. No respiratory distress.     Breath sounds: Normal breath sounds. No wheezing, rhonchi or rales.  Abdominal:     Hernia: A hernia (ventral) is present.  Skin:    General: Skin is warm and dry.     Findings: No rash.  Neurological:     Mental Status: He is alert.  Psychiatric:        Mood and Affect: Mood normal.        Behavior: Behavior normal.      Results for orders placed or performed in visit on 11/11/21  POCT Urinalysis Dipstick (Automated)  Result Value Ref Range   Color, UA yellow    Clarity, UA clear    Glucose, UA Negative Negative   Bilirubin, UA negative    Ketones, UA negative    Spec Grav, UA 1.025 1.010 - 1.025   Blood, UA negative    pH, UA 5.5 5.0 - 8.0   Protein, UA Negative Negative   Urobilinogen, UA 0.2 0.2 or 1.0 E.U./dL   Nitrite, UA negative    Leukocytes, UA  Negative Negative   *Note: Due to a large number of results and/or encounters for the requested time period, some  results have not been displayed. A complete set of results can be found in Results Review.    Assessment & Plan:   Problem List Items Addressed This Visit     BPH (benign prostatic hyperplasia) - Primary    IPSS = 27-6, severe.  Pt states flomax previously effectively treated symptoms and he desires to restart this. Discussed caution with orthostasis - he thinks BP meds actually were culprits of prior symptoms, not flomax. Will cautiously restart flomax 0.4mg  nightly. Update with effect. If not effective, consider starting 5a reductase inhibitor.        Relevant Medications   tamsulosin (FLOMAX) 0.4 MG CAPS capsule   Other Visit Diagnoses     Urinary frequency       Relevant Orders   POCT Urinalysis Dipstick (Automated) (Completed)        Meds ordered this encounter  Medications   traMADol (ULTRAM) 50 MG tablet    Sig: Take 1 tablet (50 mg total) by mouth 3 (three) times daily as needed for moderate pain.    Dispense:  90 tablet    Refill:  0   DISCONTD: tamsulosin (FLOMAX) 0.4 MG CAPS capsule    Sig: Take 1 capsule (0.4 mg total) by mouth daily.    Dispense:  90 capsule    Refill:  3   tamsulosin (FLOMAX) 0.4 MG CAPS capsule    Sig: Take 1 capsule (0.4 mg total) by mouth daily after supper.    Dispense:  90 capsule    Refill:  3   Orders Placed This Encounter  Procedures   POCT Urinalysis Dipstick (Automated)     Patient Instructions  Restart flomax, let us know how you do with this. Watch for recurrent dizziness especially with position changes.  Good to see you today. Call us with questions.   Follow up plan: Return if symptoms worsen or fail to improve.  Ria Bush, MD

## 2021-11-12 ENCOUNTER — Inpatient Hospital Stay (HOSPITAL_BASED_OUTPATIENT_CLINIC_OR_DEPARTMENT_OTHER): Payer: PPO | Admitting: Hospice and Palliative Medicine

## 2021-11-12 DIAGNOSIS — C3432 Malignant neoplasm of lower lobe, left bronchus or lung: Secondary | ICD-10-CM

## 2021-11-12 DIAGNOSIS — R5383 Other fatigue: Secondary | ICD-10-CM | POA: Diagnosis not present

## 2021-11-12 NOTE — Progress Notes (Signed)
Virtual Visit via Telephone Note  I connected with Randall Ali. on 11/12/21 at 10:30 AM EDT by telephone and verified that I am speaking with the correct person using two identifiers.  Location: Patient: Home Provider: Clinic   I discussed the limitations, risks, security and privacy concerns of performing an evaluation and management service by telephone and the availability of in person appointments. I also discussed with the patient that there may be a patient responsible charge related to this service. The patient expressed understanding and agreed to proceed.   History of Present Illness: Randall Ali is a 81 year old male with multiple medical problems including CAD status post CABG, PVD status post stenting, stage IV non-small cell lung cancer initially diagnosed May 2022 status post Carbo/Taxol now on maintenance Keytruda.  Patient has had previous and ongoing fatigue from [facility] requiring treatment holiday.   Observations/Objective: I saw patient [name] on 10/29/2021 for fatigue.  Patient was started on trial of methylphenidate.  I spoke with patient today for follow-up.  He reports no improvement since starting methylphenidate.  He still feels chronically fatigued and no intervention tried thus far has shown significant improvement in symptoms.  Patient is still active and going to the gym regularly.  Denies other symptomatic complaints or concerns today.  Assessment and Plan: Fatigue -neoplasm related.  This is likely a chronic condition for him at this point.  We will discontinue methylphenidate as it has not helped.  Follow Up Instructions: As needed   I discussed the assessment and treatment plan with the patient. The patient was provided an opportunity to ask questions and all were answered. The patient agreed with the plan and demonstrated an understanding of the instructions.   The patient was advised to call back or seek an in-person evaluation if the symptoms worsen  or if the condition fails to improve as anticipated.  I provided 5 minutes of non-face-to-face time during this encounter.   Irean Hong, NP

## 2021-11-14 ENCOUNTER — Inpatient Hospital Stay: Payer: PPO

## 2021-11-14 ENCOUNTER — Encounter: Payer: Self-pay | Admitting: Internal Medicine

## 2021-11-14 VITALS — BP 120/62 | HR 70 | Temp 97.3°F

## 2021-11-14 DIAGNOSIS — C3432 Malignant neoplasm of lower lobe, left bronchus or lung: Secondary | ICD-10-CM

## 2021-11-14 DIAGNOSIS — Z5112 Encounter for antineoplastic immunotherapy: Secondary | ICD-10-CM | POA: Diagnosis not present

## 2021-11-14 LAB — BASIC METABOLIC PANEL
Anion gap: 7 (ref 5–15)
BUN: 28 mg/dL — ABNORMAL HIGH (ref 8–23)
CO2: 24 mmol/L (ref 22–32)
Calcium: 8.8 mg/dL — ABNORMAL LOW (ref 8.9–10.3)
Chloride: 105 mmol/L (ref 98–111)
Creatinine, Ser: 0.98 mg/dL (ref 0.61–1.24)
GFR, Estimated: >60 mL/min (ref >60)
Glucose, Bld: 103 mg/dL — ABNORMAL HIGH (ref 70–99)
Potassium: 4.1 mmol/L (ref 3.5–5.1)
Sodium: 136 mmol/L (ref 135–145)

## 2021-11-14 MED ORDER — HEPARIN SOD (PORK) LOCK FLUSH 100 UNIT/ML IV SOLN
500.0000 [IU] | Freq: Once | INTRAVENOUS | Status: AC | PRN
  Administered 2021-11-14: 500 [IU]
  Filled 2021-11-14: qty 5

## 2021-11-14 MED ORDER — SODIUM CHLORIDE 0.9 % IV SOLN
Freq: Once | INTRAVENOUS | Status: AC
  Filled 2021-11-14: qty 250

## 2021-11-14 MED ORDER — DEXAMETHASONE SODIUM PHOSPHATE 10 MG/ML IJ SOLN
4.0000 mg | Freq: Once | INTRAMUSCULAR | Status: AC
  Administered 2021-11-14: 4 mg via INTRAVENOUS
  Filled 2021-11-14: qty 1

## 2021-11-14 MED ORDER — SODIUM CHLORIDE 0.9% FLUSH
10.0000 mL | Freq: Once | INTRAVENOUS | Status: AC | PRN
  Administered 2021-11-14: 10 mL
  Filled 2021-11-14: qty 10

## 2021-11-17 ENCOUNTER — Ambulatory Visit
Admission: RE | Admit: 2021-11-17 | Discharge: 2021-11-17 | Disposition: A | Discharge status: Home / Self Care | Payer: PPO | Source: Ambulatory Visit | Attending: Physical Medicine and Rehabilitation | Admitting: Physical Medicine and Rehabilitation

## 2021-11-17 DIAGNOSIS — M5126 Other intervertebral disc displacement, lumbar region: Secondary | ICD-10-CM | POA: Diagnosis not present

## 2021-11-17 DIAGNOSIS — M48061 Spinal stenosis, lumbar region without neurogenic claudication: Secondary | ICD-10-CM | POA: Diagnosis not present

## 2021-11-17 DIAGNOSIS — M4807 Spinal stenosis, lumbosacral region: Secondary | ICD-10-CM | POA: Diagnosis not present

## 2021-11-17 DIAGNOSIS — M545 Low back pain, unspecified: Secondary | ICD-10-CM

## 2021-11-17 DIAGNOSIS — M5127 Other intervertebral disc displacement, lumbosacral region: Secondary | ICD-10-CM | POA: Diagnosis not present

## 2021-11-17 MED ORDER — GADOBENATE DIMEGLUMINE 529 MG/ML IV SOLN
20.0000 mL | Freq: Once | INTRAVENOUS | Status: AC | PRN
  Administered 2021-11-17: 20 mL via INTRAVENOUS

## 2021-11-20 ENCOUNTER — Telehealth: Payer: Self-pay | Admitting: Cardiovascular Disease

## 2021-11-20 DIAGNOSIS — M5136 Other intervertebral disc degeneration, lumbar region: Secondary | ICD-10-CM | POA: Diagnosis not present

## 2021-11-20 DIAGNOSIS — M5416 Radiculopathy, lumbar region: Secondary | ICD-10-CM | POA: Diagnosis not present

## 2021-11-20 NOTE — Telephone Encounter (Signed)
[name], request to hold aspirin for 7 days prior to [facility].  Last cardiac catheterization 06/2017 revealed: Severe three vessel disease, occluded SVG to the RCA, Patent SVG to OM 1 and OM2, patent LIMA to the LAD Will try medical management first Add plavix, nitrates intervention would require atherectomy in [facility]   He has also had extensive PV intervention. Would you prefer that request be sent to vascular for clearance to hold aspirin.  Please route your response to p cv div preop.  Thank you, Emmaline Life, NP-C    11/20/2021, 3:54 PM [place] [address], [address] Office [phone] Fax [phone]

## 2021-11-20 NOTE — Telephone Encounter (Signed)
   Pre-operative Risk Assessment    Patient Name: Randall Ali.  DOB: 1940/07/01 MRN: [id number]      Request for Surgical Clearance    Procedure:   Rt L5-S1 Inter laminal Epidural injection  Date of Surgery:  Clearance TBD                                 Surgeon:  Ron Agee  Surgeon's Group or Practice Name:  [facility] Phone number:  [phone] Fax number:  [phone]   Type of Clearance Requested:   Pharmacy - hold asa 7 days prior to injection    Type of Anesthesia:  Not Indicated   Additional requests/questions:    [name]   11/20/2021, 1:50 PM

## 2021-11-22 ENCOUNTER — Other Ambulatory Visit: Payer: Self-pay | Admitting: *Deleted

## 2021-11-22 ENCOUNTER — Telehealth: Payer: Self-pay | Admitting: *Deleted

## 2021-11-22 DIAGNOSIS — R5383 Other fatigue: Secondary | ICD-10-CM

## 2021-11-22 MED ORDER — DEXAMETHASONE 2 MG PO TABS: 2.0000 mg | ORAL_TABLET | Freq: Every day | ORAL | 0 refills | Status: AC

## 2021-11-22 NOTE — Telephone Encounter (Signed)
Randall Ali has called this morning stating that he feels terrible, no energy and thinks he needs IV fluids and steroids today, Please advise

## 2021-11-24 NOTE — Telephone Encounter (Signed)
   Primary Cardiologist: [name], MD  Chart reviewed as part of pre-operative protocol coverage. Given past medical history and time since last visit, based on ACC/AHA guidelines, Randall Ali. would be at acceptable risk for the planned procedure without further cardiovascular testing.   Per [name]  the patient's past medical history includes coronary artery disease, CABG, and peripheral arterial disease.  He would like the patient to continue on his low-dose aspirin throughout the procedure.  If the procedure is not possible with continuing aspirin it may be held for 7 days prior to the procedure however, this is considered high risk.  I will route this recommendation to the requesting party via Epic fax function and remove from pre-op pool.  Please call with questions.  Jossie Ng. [place] Sigmon NP-C    11/24/2021, 9:55 PM [place] [address] Office [phone] Fax [phone]

## 2021-11-27 ENCOUNTER — Inpatient Hospital Stay: Payer: PPO | Admitting: Occupational Therapy

## 2021-11-27 DIAGNOSIS — M5459 Other low back pain: Secondary | ICD-10-CM

## 2021-11-27 DIAGNOSIS — R5383 Other fatigue: Secondary | ICD-10-CM

## 2021-11-27 NOTE — Therapy (Signed)
[facility] at [facility], [place], [place], [address] Phone: [phone]   Fax:  [phone]  Occupational Therapy Screen:  Patient Details  Name: Randall Ali. MRN: [id number] Date of Birth: [birth year] No data recorded  Encounter Date: 11/27/2021   OT End of Session - 11/27/21 1802     Visit Number 0             Past Medical History:  Diagnosis Date   Allergy    seasonal   Arthritis    all over- in general    CAD (coronary artery disease)    a. inferior wall MI 10/01 s/p PCI/DES to RCA; b. Myoview 3/16 neg for ischemia; c. LHC 8/16: ostLAD 80%, OM1 70%, OM2 70% x 2 lesions, mRCA 30%, dRCA 70% s/p 4-V CABG 01/24/15 (LIMA-LAD, VG- OM1, VG-OM2, VG-PDA)    Cancer (HCC)    skin, melanoma   Carotid artery disease ([place])    a. [place] 8/16: 1-39% bilateral ICA stenosis   Cataract    removed   Diastolic dysfunction    a. TTE 8/16: EF 55-60%, no RWMA, Gr1DD, calcified mitral annulus, mild biatrial enlargement   Erectile dysfunction    GERD (gastroesophageal reflux disease)    History of elbow surgery    History of hiatal hernia    HLD (hyperlipidemia)    HTN (hypertension)    Inferior myocardial infarction ([place]) 03/2000   stent RCA   Lung cancer ([place])    Postoperative wound infection 02/02/2015   Reflux esophagitis    Sleep apnea 2017   CPAP at night    Past Surgical History:  Procedure Laterality Date   arm surgery  2010   BROW LIFT Bilateral 11/25/2019   Procedure: BROW PTOSIS REPAIR BILATERAL;  Surgeon: Karle Starch, MD;  Location: [facility];  Service: Ophthalmology;  Laterality: Bilateral;  sleep apnea   CARDIAC CATHETERIZATION  06/24/2011   CARDIAC CATHETERIZATION N/A 01/18/2015   Procedure: Left Heart Cath with coronary angiography;  Surgeon: Minna Merritts, MD;  Location: [facility];  Service: Cardiovascular;  Laterality: N/A;   CARDIAC CATHETERIZATION N/A 01/18/2015    Procedure: Intravascular Pressure Wire/FFR Study;  Surgeon: [name], MD;  Location: [facility];  Service: Cardiovascular;  Laterality: N/A;   CAROTID STENT  03/10/2011   COLONOSCOPY  2010   COLONOSCOPY  06/14/2014   Dr Hilarie Fredrickson   CORONARY ARTERY BYPASS GRAFT N/A 01/24/2015   Procedure: CORONARY ARTERY BYPASS GRAFTING x 4 (LIMA-LAD, SVG-Int 1- Int 2, SVG-PD) ENDOSCOPIC GREATER SAPHENOUS VEIN HARVEST LEFT LEG;  Surgeon: [name], MD;  Location: [facility];  Service: Open Heart Surgery;  Laterality: N/A;   EMBOLECTOMY  06/15/2019   Procedure: EMBOLECTOMY;  Surgeon: Katha Cabal, MD;  Location: [facility];  Service: Vascular;;  right superficial femoral artery   ENDARTERECTOMY FEMORAL Right 06/15/2019   Procedure: ENDARTERECTOMY FEMORAL;  Surgeon: Katha Cabal, MD;  Location: [facility];  Service: Vascular;  Laterality: Right;  common femoral profunda femoris superficial femoral   ESOPHAGOGASTRODUODENOSCOPY (EGD) WITH PROPOFOL N/A 04/24/2016   Procedure: ESOPHAGOGASTRODUODENOSCOPY (EGD) WITH PROPOFOL;  Surgeon: Jerene Bears, MD;  Location: [facility];  Service: Gastroenterology;  Laterality: N/A;   EYE SURGERY     lasik 15 yrs. ago, cataracts removed - both eyes    HAMMER TOE SURGERY     right toe   INSERTION OF ILIAC STENT Right 06/15/2019   Procedure: INSERTION  OF ILIAC STENT ( STENTING OF SFA/POP ARTERY );  Surgeon: Katha Cabal, MD;  Location: [facility];  Service: Vascular;  Laterality: Right;  angioplpasty and stent placement: right superficial femoral right tibiopopliteal trunk bilateral common iliac arteries   IR IMAGING GUIDED PORT INSERTION  11/09/2020   LEFT HEART CATH AND CORONARY ANGIOGRAPHY Left 06/10/2017   Procedure: LEFT HEART CATH AND CORONARY ANGIOGRAPHY;  Surgeon: Minna Merritts, MD;  Location: [facility];  Service: Cardiovascular;  Laterality: Left;   LOWER EXTREMITY ANGIOGRAPHY Left 01/04/2019   Procedure: LOWER EXTREMITY  ANGIOGRAPHY;  Surgeon: Katha Cabal, MD;  Location: [facility];  Service: Cardiovascular;  Laterality: Left;   LOWER EXTREMITY ANGIOGRAPHY Right 01/25/2019   Procedure: LOWER EXTREMITY ANGIOGRAPHY;  Surgeon: Katha Cabal, MD;  Location: [facility];  Service: Cardiovascular;  Laterality: Right;   LOWER EXTREMITY ANGIOGRAPHY Right 01/15/2021   LOWER EXTREMITY ANGIOGRAPHY and stent placement to R SFA and popliteal artery Delana Meyer, Dolores Lory, MD)   NASAL SINUS SURGERY  2008   septpolasty, bilateral turbinate reduction   SHOULDER ARTHROSCOPY  2012   TEE WITHOUT CARDIOVERSION N/A 01/24/2015   Procedure: TRANSESOPHAGEAL ECHOCARDIOGRAM (TEE);  Surgeon: [name], MD;  Location: [facility];  Service: Open Heart Surgery;  Laterality: N/A;   TOE SURGERY  1994   UPPER GI ENDOSCOPY  07/2014, 04-24-16   Dr Raquel James   WRIST SURGERY  2011    There were no vitals filed for this visit.   Subjective Assessment - 11/27/21 1800     Subjective  I stay tired.  And fatigue.  If I do not go in the morning to the gym then I will do it.  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  Sitting and lying down my back is okay but if I stand or walk the pain goes up to 7/10.  I do machines in the gym I do lose weight so I do legs.  And I do the stationary bike.  I did had an MRI that showed arthritis in my lower back.  The doctor gave me this order.    Currently in Pain? Yes    Pain Score 8     Pain Location Back    Pain Orientation Lower    Pain Descriptors / Indicators Aching    Pain Type Chronic pain               OT SCREEN 02/27/21: Done the gym 4-5 x wk even thru chemo - but this chemo made me tired, my legs give out - cannot walk far - and have some back pain and groin pain. Started last week some leg work in the gym  Do have pool - but to cold now- should have seen you during treatment I have bike that I can start  at home  BERG balance test 52/56 - low risk for falling   O2 decrease to 92% and HR increase to 87 - but with sitting recover to 95% and HR to 81  Pt to cont at gym -but do -stationary bike or nu-step to take pressure of hip and foot -Work on sit<> stand - can later do band around thighs -Sidestepping - and lateral lunges with band or weight - but should not have back pain -Bridge - pain free for back - slowly up and down lowering - 10reps  -ankle AROM  in all planes sitting -heel raises - if pain free  Gradually increase time in gym , cardio, weights, resistance - reps and sets  Pt starting soft ball playing tomorrow - has tournament in Oct per pt  And 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  Patient was seen last September. Patient arrived with a physical therapy order for degenerative changes to lower back and spondylosis.  Patient had an MRI last week. Patient goes to the gym 5-6 times a week doing strengthening machines and lose weight.  As well as the bike.  Patient also coaches softball.  Doing tournaments.  Pain can increase 7-8/10 with standing and walking. Patient also fatigues. Explained to patient that he needs physical therapy for core strengthening lower back.  Patient's balance test-BERG balance test patient scored a 51 out of 56. Continues to be low risk for falling but increased back pain limiting his quality of life.  Recommend for patient to follow-up with physical therapy outpatient for back pain and strengthening.  Patient will [facility].  No further OT needs patient to contact me if needed.                                     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/15/2019   Carotid stenosis, asymptomatic, bilateral 11/25/2018   Insomnia 11/10/2018   Lumbar spondylosis 11/05/2018   Osseous and subluxation stenosis of intervertebral foramina of lumbar region 11/05/2018   Vascular claudication ([place]) 11/05/2018   Vertigo 10/08/2018   Chronic headache 08/23/2018   Chronic back pain 09/09/2017   Subclavian artery stenosis, right ([place]) 09/09/2017   BPH (benign prostatic hyperplasia) 08/24/2017   Health maintenance examination 07/28/2016   Advanced care planning/counseling discussion 07/28/2016   Hearing loss 07/28/2016   History of esophagitis    Postoperative atrial fibrillation ([place]) 03/27/2015   Infection due to Enterobacter aerogenes 02/02/2015   Status post right foot surgery 02/02/2015   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   History of coronary artery stent placement    OSA (obstructive sleep apnea) 01/13/2015   Status post hardware removal 01/01/2015   Hepatic steatosis 11/24/2014   Atherosclerosis of native arteries of extremity with intermittent claudication ([place]) 04/11/2014   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   Chronic fatigue and malaise 01/11/2014   Hyperlipidemia 11/09/2013   Essential hypertension 11/09/2013   Diastasis recti 05/19/2013   H/O arthroplasty 05/19/2013   Degenerative arthritis of left elbow 10/07/2012   Trigger thumb of right hand 07/01/2012   Medicare annual wellness visit, subsequent 02/27/2012   Erectile dysfunction 02/27/2012   Pulmonary nodule 12/24/2011   CAD,  NATIVE VESSEL  03/08/2009    Rosalyn Gess, OTR/L,CLT 11/27/2021, 6:02 PM  [facility] at [facility], [place], [place], [address] Phone: [phone]   Fax:  [phone]  Name: Randall Ali. MRN: [id number] Date of Birth: September 28, 1940

## 2021-11-28 ENCOUNTER — Ambulatory Visit: Payer: PPO

## 2021-11-28 ENCOUNTER — Other Ambulatory Visit: Payer: PPO

## 2021-11-28 ENCOUNTER — Ambulatory Visit: Payer: PPO | Admitting: Internal Medicine

## 2021-11-28 DIAGNOSIS — G4733 Obstructive sleep apnea (adult) (pediatric): Secondary | ICD-10-CM | POA: Diagnosis not present

## 2021-11-28 DIAGNOSIS — M4726 Other spondylosis with radiculopathy, lumbar region: Secondary | ICD-10-CM | POA: Diagnosis not present

## 2021-12-04 ENCOUNTER — Telehealth: Payer: Self-pay | Admitting: *Deleted

## 2021-12-04 NOTE — Telephone Encounter (Signed)
Patient called reporting that he woke up feeling really bad today and is asking if there is anything we can do for him. Please advise

## 2021-12-04 NOTE — Telephone Encounter (Signed)
Randall Ali will do a palliative telephone visit with him tomorrow morning to discuss.

## 2021-12-05 ENCOUNTER — Inpatient Hospital Stay: Payer: PPO

## 2021-12-05 ENCOUNTER — Inpatient Hospital Stay: Payer: PPO | Admitting: Hospice and Palliative Medicine

## 2021-12-05 ENCOUNTER — Encounter: Payer: Self-pay | Admitting: Internal Medicine

## 2021-12-05 ENCOUNTER — Inpatient Hospital Stay: Payer: PPO | Admitting: Internal Medicine

## 2021-12-05 DIAGNOSIS — C3432 Malignant neoplasm of lower lobe, left bronchus or lung: Secondary | ICD-10-CM

## 2021-12-05 DIAGNOSIS — Z5112 Encounter for antineoplastic immunotherapy: Secondary | ICD-10-CM | POA: Diagnosis not present

## 2021-12-05 LAB — CBC WITH DIFFERENTIAL/PLATELET
Abs Immature Granulocytes: 0.1 10*3/uL — ABNORMAL HIGH (ref 0.00–0.07)
Basophils Absolute: 0.1 10*3/uL (ref 0.0–0.1)
Basophils Relative: 1 %
Eosinophils Absolute: 0.3 10*3/uL (ref 0.0–0.5)
Eosinophils Relative: 4 %
HCT: 44.8 % (ref 39.0–52.0)
Hemoglobin: 15 g/dL (ref 13.0–17.0)
Immature Granulocytes: 1 %
Lymphocytes Relative: 14 %
Lymphs Abs: 1.2 10*3/uL (ref 0.7–4.0)
MCH: 31.6 pg (ref 26.0–34.0)
MCHC: 33.5 g/dL (ref 30.0–36.0)
MCV: 94.5 fL (ref 80.0–100.0)
Monocytes Absolute: 0.8 10*3/uL (ref 0.1–1.0)
Monocytes Relative: 9 %
Neutro Abs: 6.3 10*3/uL (ref 1.7–7.7)
Neutrophils Relative %: 71 %
Platelets: 169 10*3/uL (ref 150–400)
RBC: 4.74 MIL/uL (ref 4.22–5.81)
RDW: 12.2 % (ref 11.5–15.5)
WBC: 8.7 10*3/uL (ref 4.0–10.5)
nRBC: 0 % (ref 0.0–0.2)

## 2021-12-05 LAB — COMPREHENSIVE METABOLIC PANEL
ALT: 17 U/L (ref 0–44)
AST: 17 U/L (ref 15–41)
Albumin: 4 g/dL (ref 3.5–5.0)
Alkaline Phosphatase: 52 U/L (ref 38–126)
Anion gap: 8 (ref 5–15)
BUN: 25 mg/dL — ABNORMAL HIGH (ref 8–23)
CO2: 24 mmol/L (ref 22–32)
Calcium: 9.2 mg/dL (ref 8.9–10.3)
Chloride: 107 mmol/L (ref 98–111)
Creatinine, Ser: 0.97 mg/dL (ref 0.61–1.24)
GFR, Estimated: >60 mL/min (ref >60)
Glucose, Bld: 108 mg/dL — ABNORMAL HIGH (ref 70–99)
Potassium: 4.3 mmol/L (ref 3.5–5.1)
Sodium: 139 mmol/L (ref 135–145)
Total Bilirubin: 0.6 mg/dL (ref 0.3–1.2)
Total Protein: 6.9 g/dL (ref 6.5–8.1)

## 2021-12-05 MED ORDER — SODIUM CHLORIDE 0.9 % IV SOLN
200.0000 mg | Freq: Once | INTRAVENOUS | Status: AC
  Administered 2021-12-05: 200 mg via INTRAVENOUS
  Filled 2021-12-05: qty 8

## 2021-12-05 MED ORDER — HEPARIN SOD (PORK) LOCK FLUSH 100 UNIT/ML IV SOLN
500.0000 [IU] | Freq: Once | INTRAVENOUS | Status: AC | PRN
  Administered 2021-12-05: 500 [IU]
  Filled 2021-12-05: qty 5

## 2021-12-05 MED ORDER — FLUTICASONE-SALMETEROL 115-21 MCG/ACT IN AERO: 2.0000 | INHALATION_SPRAY | Freq: Two times a day (BID) | RESPIRATORY_TRACT | 3 refills | Status: DC

## 2021-12-05 MED ORDER — SODIUM CHLORIDE 0.9 % IV SOLN
Freq: Once | INTRAVENOUS | Status: AC
  Filled 2021-12-05: qty 250

## 2021-12-05 NOTE — Progress Notes (Signed)
Pt states he called yesterday to let you know if wasn't feeling good over all. Messages in his chart, he wasn't called back, Pt states he feels like he did after getting tx when he got those. Concerned he may need fluids.  He does have appt with Josh for palliative after you.

## 2021-12-05 NOTE — Progress Notes (Signed)
[place] NOTE  Patient Care Team: Ria Bush, MD as PCP - General (Family Medicine) [name] [name], MD as PCP - Cardiology (Cardiology) [name], MD (Internal Medicine) Minna Merritts, MD as Consulting Physician (Cardiology) Pieter Partridge, DO as Consulting Physician (Neurology) Cammie Sickle, MD as Consulting Physician (Hematology and Oncology) Telford Nab, RN as Oncology Nurse Navigator  CHIEF COMPLAINTS/PURPOSE OF CONSULTATION: lung cancer    Oncology History Overview Note  #MAY 2022-Lung cancer-non-small cell [CT guided bx] T3N1 vs stage IV Dr.Hendrickson.  MRI brain negative for malignancy.# 1. April 2022- LLL ~4.0 cm mass in the superior segment left lower lobe abuts the major fissure and the posterior pleural surface without visible chest wall invasion without pleural effusion. There 2-3 other small nodules in the left lower lobe, largest measures 9 mm in diameter. These are suspicious for same lobe satellite lesions and there is left hilar adenopathy. Assuming non-small cell lung cancer the appearance is compatible with T3 N1 M0 disease (stage IIIA).  # right suprarenal nodule-awaiting biopsy on 5/31- non-small cell [revived at tumor conference at [place]  # June 9th 2022- CARBO-TAXOl-KEYTRUDA; Fulphila  # ? Subtle adrenal insufficiency- No acute process - -September MRI brain negative for any pituitary hypophysitis/brain metastasis. Prednisone   MOLECULAR TESTING: NGS TPS-PDL 100%; EXON 12 amplification* mutations.    # CAD [CABG 2016; Dr.Gollan]   Cancer of lower lobe of left lung ([place])  11/02/2020 Initial Diagnosis   Cancer of lower lobe of left lung ([place])   11/02/2020 Cancer Staging   Staging form: Lung, AJCC 8th Edition - Clinical: Stage IVA (cT3, cN1, cM1a) - Signed by Cammie Sickle, MD on 11/02/2020   11/16/2020 -  Chemotherapy   Patient is on Treatment Plan : LUNG NSCLC Carboplatin + Paclitaxel +  Pembrolizumab q21d x 4 cycles / Pembrolizumab Maintenance Q21D        HISTORY OF PRESENTING ILLNESS: Alone. walking independently.  Randall Ali. 81 y.o.  male lung cancer-non-small cell stage IV [supra-renal nodule s/p biopsy] currently on Keytruda is for follow-up.  Is currently awaiting evaluation with GI in [place].   He is currently getting physical therapy for his back pain status post steroid injection; right back June 2023 no malignancy noted.  Continues to have ongoing fatigue needing IV fluids./Dexamethasone.  Denies any tingling or numbness in the extremities. No nausea no vomiting no headaches.  Review of Systems  Constitutional:  Positive for malaise/fatigue. Negative for chills, diaphoresis, fever and weight loss.  HENT:  Negative for nosebleeds and sore throat.   Eyes:  Negative for double vision.  Respiratory:  Negative for cough, hemoptysis, sputum production, shortness of breath and wheezing.   Cardiovascular:  Negative for chest pain, palpitations, orthopnea and leg swelling.  Gastrointestinal:  Positive for nausea. Negative for abdominal pain, blood in stool, constipation, diarrhea, heartburn, melena and vomiting.  Genitourinary:  Negative for dysuria, frequency and urgency.  Musculoskeletal:  Positive for back pain and joint pain.  Skin: Negative.  Negative for itching and rash.  Neurological:  Negative for dizziness, tingling, focal weakness, weakness and headaches.  Endo/Heme/Allergies:  Does not bruise/bleed easily.  Psychiatric/Behavioral:  Negative for depression. The patient is not nervous/anxious and does not have insomnia.      MEDICAL HISTORY:  Past Medical History:  Diagnosis Date   Allergy    seasonal   Arthritis    all over- in general    CAD (coronary artery disease)  a. inferior wall MI 10/01 s/p PCI/DES to RCA; b. Myoview 3/16 neg for ischemia; c. LHC 8/16: ostLAD 80%, OM1 70%, OM2 70% x 2 lesions, mRCA 30%, dRCA 70% s/p 4-V CABG  01/24/15 (LIMA-LAD, VG- OM1, VG-OM2, VG-PDA)    Cancer (HCC)    skin, melanoma   Carotid artery disease ([place])    a. [place] 8/16: 1-39% bilateral ICA stenosis   Cataract    removed   Diastolic dysfunction    a. TTE 8/16: EF 55-60%, no RWMA, Gr1DD, calcified mitral annulus, mild biatrial enlargement   Erectile dysfunction    GERD (gastroesophageal reflux disease)    History of elbow surgery    History of hiatal hernia    HLD (hyperlipidemia)    HTN (hypertension)    Inferior myocardial infarction ([place]) 03/2000   stent RCA   Lung cancer ([place])    Postoperative wound infection 02/02/2015   Reflux esophagitis    Sleep apnea 2017   CPAP at night    SURGICAL HISTORY: Past Surgical History:  Procedure Laterality Date   arm surgery  2010   BROW LIFT Bilateral 11/25/2019   Procedure: BROW PTOSIS REPAIR BILATERAL;  Surgeon: Karle Starch, MD;  Location: [facility];  Service: Ophthalmology;  Laterality: Bilateral;  sleep apnea   CARDIAC CATHETERIZATION  06/24/2011   CARDIAC CATHETERIZATION N/A 01/18/2015   Procedure: Left Heart Cath with coronary angiography;  Surgeon: Minna Merritts, MD;  Location: [facility];  Service: Cardiovascular;  Laterality: N/A;   CARDIAC CATHETERIZATION N/A 01/18/2015   Procedure: Intravascular Pressure Wire/FFR Study;  Surgeon: [name], MD;  Location: [facility];  Service: Cardiovascular;  Laterality: N/A;   CAROTID STENT  03/10/2011   COLONOSCOPY  2010   COLONOSCOPY  06/14/2014   Dr Hilarie Fredrickson   CORONARY ARTERY BYPASS GRAFT N/A 01/24/2015   Procedure: CORONARY ARTERY BYPASS GRAFTING x 4 (LIMA-LAD, SVG-Int 1- Int 2, SVG-PD) ENDOSCOPIC GREATER SAPHENOUS VEIN HARVEST LEFT LEG;  Surgeon: [name], MD;  Location: [facility];  Service: Open Heart Surgery;  Laterality: N/A;   EMBOLECTOMY  06/15/2019   Procedure: EMBOLECTOMY;  Surgeon: Katha Cabal, MD;  Location: [facility];  Service: Vascular;;  right superficial femoral  artery   ENDARTERECTOMY FEMORAL Right 06/15/2019   Procedure: ENDARTERECTOMY FEMORAL;  Surgeon: Katha Cabal, MD;  Location: [facility];  Service: Vascular;  Laterality: Right;  common femoral profunda femoris superficial femoral   ESOPHAGOGASTRODUODENOSCOPY (EGD) WITH PROPOFOL N/A 04/24/2016   Procedure: ESOPHAGOGASTRODUODENOSCOPY (EGD) WITH PROPOFOL;  Surgeon: Jerene Bears, MD;  [facility];  Service: Gastroenterology;  Laterality: N/A;   EYE SURGERY     lasik 15 yrs. ago, cataracts removed - both eyes    HAMMER TOE SURGERY     right toe   INSERTION OF ILIAC STENT Right 06/15/2019   Procedure: INSERTION OF ILIAC STENT ( STENTING OF SFA/POP ARTERY );  Surgeon: Katha Cabal, MD;  Location: [facility];  Service: Vascular;  Laterality: Right;  angioplpasty and stent placement: right superficial femoral right tibiopopliteal trunk bilateral common iliac arteries   IR IMAGING GUIDED PORT INSERTION  11/09/2020   LEFT HEART CATH AND CORONARY ANGIOGRAPHY Left 06/10/2017   Procedure: LEFT HEART CATH AND CORONARY ANGIOGRAPHY;  Surgeon: Minna Merritts, MD;  Location: [facility];  Service: Cardiovascular;  Laterality: Left;   LOWER EXTREMITY ANGIOGRAPHY Left 01/04/2019   Procedure: LOWER EXTREMITY ANGIOGRAPHY;  Surgeon: Katha Cabal, MD;  Location: [facility];  Service: Cardiovascular;  Laterality: Left;   LOWER EXTREMITY ANGIOGRAPHY Right 01/25/2019   Procedure: LOWER EXTREMITY ANGIOGRAPHY;  Surgeon: Katha Cabal, MD;  Location: [facility];  Service: Cardiovascular;  Laterality: Right;   LOWER EXTREMITY ANGIOGRAPHY Right 01/15/2021   LOWER EXTREMITY ANGIOGRAPHY and stent placement to R SFA and popliteal artery Delana Meyer, Dolores Lory, MD)   NASAL SINUS SURGERY  2008   septpolasty, bilateral turbinate reduction   SHOULDER ARTHROSCOPY  2012   TEE WITHOUT CARDIOVERSION N/A 01/24/2015   Procedure: TRANSESOPHAGEAL ECHOCARDIOGRAM (TEE);   Surgeon: [name], MD;  Location: [facility];  Service: Open Heart Surgery;  Laterality: N/A;   TOE SURGERY  1994   UPPER GI ENDOSCOPY  07/2014, 04-24-16   Dr Raquel James   WRIST SURGERY  2011    SOCIAL HISTORY: Social History   Socioeconomic History   Marital status: Single    Spouse name: Not on file   Number of children: Not on file   Years of education: 12   Highest education level: Not on file  Occupational History   Occupation: retired    Comment: [employer]  Tobacco Use   Smoking status: Former    Packs/day: 2.50    Years: 40.00    Total pack years: 100.00    Types: Cigarettes    Quit date: 03/24/2000    Years since quitting: 21.7   Smokeless tobacco: Never  Vaping Use   Vaping Use: Never used  Substance and Sexual Activity   Alcohol use: Yes    Comment: occassionally   Drug use: No   Sexual activity: Yes  Other Topics Concern   Not on file  Social History Narrative   Singled; lives with son and dog    Occ: retired, back part time at [employer];    Activity: gym 4-5d/wk   Diet: good water, fruits/vegetables daily   Caffeine Use-yes      ------------------------------------       Car sales- retd; [employer]- retd; quit smoking 2001. Alcohol couple nights a week. Live in [place]. Daughter lives 10 mins.    Social Determinants of Health   Financial Resource Strain: Low Risk  (09/26/2021)   Overall Financial Resource Strain (CARDIA)    Difficulty of Paying Living Expenses: Not hard at all  Food Insecurity: No Food Insecurity (09/26/2021)   Hunger Vital Sign    Worried About Running Out of Food in the Last Year: Never true    Ran Out of Food in the Last Year: Never true  Transportation Needs: No Transportation Needs (09/26/2021)   PRAPARE - Hydrologist (Medical): No    Lack of Transportation (Non-Medical): No  Physical Activity: Sufficiently Active (09/26/2021)   Exercise Vital Sign    Days of Exercise per Week: 5 days     Minutes of Exercise per Session: 30 min  Stress: No Stress Concern Present (09/26/2021)   [place]    Feeling of Stress : Only a little  Social Connections: Socially Isolated (09/26/2021)   Social Connection and Isolation Panel [NHANES]    Frequency of Communication with Friends and Family: Twice a week    Frequency of Social Gatherings with Friends and Family: Twice a week    Attends Religious Services: Never    Marine scientist or Organizations: Not on file    Attends Archivist Meetings: Never    Marital Status:  Never married  Intimate Partner Violence: Not At Risk (09/26/2021)   Humiliation, Afraid, Rape, and Kick questionnaire    Fear of Current or Ex-Partner: No    Emotionally Abused: No    Physically Abused: No    Sexually Abused: No    FAMILY HISTORY: Family History  Problem Relation Age of Onset   Hypertension Mother    Heart disease Mother    Hypertension Father    Diabetes Father    Lymphoma Sister    Heart disease Brother 73   Cancer Paternal Grandfather    Colon cancer Neg Hx    Prostate cancer Neg Hx    Bladder Cancer Neg Hx    Kidney cancer Neg Hx     ALLERGIES:  has No Known Allergies.  MEDICATIONS:  Current Outpatient Medications  Medication Sig Dispense Refill   acetaminophen (TYLENOL) 325 MG tablet Take 650 mg by mouth as needed for moderate pain or mild pain.     albuterol (VENTOLIN HFA) 108 (90 Base) MCG/ACT inhaler INHALE 2 PUFFS INTO THE LUNGS EVERY 6 HOURS AS NEEDED FOR WHEEZING OR SHORTNESS OF BREATH 8.5 g 2   Ascorbic Acid (VITAMIN C) 1000 MG tablet Take 1,000 mg by mouth daily.     aspirin 81 MG EC tablet Take 81 mg by mouth daily.       Calcium-Magnesium-Vitamin D (CALCIUM 1200+D3 PO) Take 1 tablet by mouth daily.     Carboxymethylcellul-Glycerin (LUBRICATING EYE DROPS OP) Place 1 drop into both eyes daily as needed (irritation).     Cholecalciferol (VITAMIN D3)  50 MCG (2000 UT) TABS Take 2,000 Units by mouth daily.     Cyanocobalamin (B-12) 5000 MCG CAPS Take 5,000 mcg by mouth daily.     docusate sodium (COLACE) 100 MG capsule Take 200 mg by mouth at bedtime.     fluticasone-salmeterol (ADVAIR HFA) 115-21 MCG/ACT inhaler Inhale 2 puffs into the lungs 2 (two) times daily. 1 each 3   lidocaine-prilocaine (EMLA) cream Apply 30 -45 mins prior to port access. 30 g 3   loratadine (CLARITIN) 10 MG tablet Take 10 mg by mouth daily.     Melatonin 10 MG CAPS Take 10 mg by mouth at bedtime as needed (sleep).     nitroGLYCERIN (NITROSTAT) 0.4 MG SL tablet Place 1 tablet (0.4 mg total) under the tongue every 5 (five) minutes as needed for up to 25 doses for chest pain. 25 tablet 1   pantoprazole (PROTONIX) 40 MG tablet TAKE 1 TABLET BY MOUTH DAILY 90 tablet 3   prochlorperazine (COMPAZINE) 10 MG tablet Take 1 tablet (10 mg total) by mouth every 6 (six) hours as needed for nausea or vomiting. 40 tablet 1   simvastatin (ZOCOR) 40 MG tablet Take 1 tablet (40 mg total) by mouth at bedtime. 90 tablet 3   traMADol (ULTRAM) 50 MG tablet Take 1 tablet (50 mg total) by mouth 3 (three) times daily as needed for moderate pain. 90 tablet 0   valsartan (DIOVAN) 80 MG tablet One daily 30 tablet 11   Vitamin E 450 MG (1000 UT) CAPS Take 450 Units by mouth daily.     Zinc 50 MG TABS Take 50 mg by mouth daily.     fluticasone (FLONASE) 50 MCG/ACT nasal spray Place into both nostrils. (Patient not taking: Reported on 12/05/2021)     No current facility-administered medications for this visit.   Facility-Administered Medications Ordered in Other Visits  Medication Dose Route Frequency Provider Last Rate Last Admin  heparin lock flush 100 UNIT/ML injection               .  PHYSICAL EXAMINATION: ECOG PERFORMANCE STATUS: 0 - Asymptomatic  Vitals:   12/05/21 0845  BP: 122/70  Pulse: 74  Temp: 99.8 F (37.7 C)  SpO2: 97%      Filed Weights   12/05/21 0845  Weight:  215 lb (97.5 kg)        Physical Exam HENT:     Head: Normocephalic and atraumatic.     Mouth/Throat:     Pharynx: No oropharyngeal exudate.  Eyes:     Pupils: Pupils are equal, round, and reactive to light.  Cardiovascular:     Rate and Rhythm: Normal rate and regular rhythm.  Pulmonary:     Effort: No respiratory distress.     Breath sounds: No wheezing.     Comments: Decreased air entry bilaterally. Abdominal:     General: Bowel sounds are normal. There is no distension.     Palpations: Abdomen is soft. There is no mass.     Tenderness: There is no abdominal tenderness. There is no guarding or rebound.  Musculoskeletal:        General: No tenderness. Normal range of motion.     Cervical back: Normal range of motion and neck supple.  Skin:    General: Skin is warm.  Neurological:     Mental Status: He is alert and oriented to person, place, and time.  Psychiatric:        Mood and Affect: Affect normal.      LABORATORY DATA:  I have reviewed the data as listed Lab Results  Component Value Date   WBC 8.7 12/05/2021   HGB 15.0 12/05/2021   HCT 44.8 12/05/2021   MCV 94.5 12/05/2021   PLT 169 12/05/2021   Recent Labs    10/24/21 1006 10/29/21 1300 11/07/21 1021 11/14/21 0812 12/05/21 0834  NA 136   < > 135 136 139  K 3.8   < > 4.1 4.1 4.3  CL 107   < > 105 105 107  CO2 23   < > 24 24 24   GLUCOSE 94   < > 116* 103* 108*  BUN 20   < > 23 28* 25*  CREATININE 1.01   < > 0.93 0.98 0.97  CALCIUM 8.8*   < > 8.8* 8.8* 9.2  GFRNONAA >60   < > >60 >60 >60  PROT 6.5  --  6.8  --  6.9  ALBUMIN 3.8  --  3.9  --  4.0  AST 16  --  21  --  17  ALT 16  --  20  --  17  ALKPHOS 53  --  51  --  52  BILITOT 0.7  --  0.6  --  0.6   < > = values in this interval not displayed.    RADIOGRAPHIC STUDIES: I have personally reviewed the radiological images as listed and agreed with the findings in the report. MR Lumbar Spine W Wo Contrast  Result Date: 11/19/2021 CLINICAL  DATA:  Low back pain, worse over the last 6 months EXAM: MRI LUMBAR SPINE WITHOUT AND WITH CONTRAST TECHNIQUE: Multiplanar and multiecho pulse sequences of the lumbar spine were obtained without and with intravenous contrast. CONTRAST:  50mL MULTIHANCE GADOBENATE DIMEGLUMINE 529 MG/ML IV SOLN COMPARISON:  05/05/2020 FINDINGS: Segmentation:  5 lumbar type vertebrae Alignment:  Mild dextroscoliosis Vertebrae:  No fracture, evidence of discitis, or  bone lesion. Conus medullaris and cauda equina: Conus extends to the L1 level. Conus and cauda equina appear normal. Paraspinal and other soft tissues: Negative for perispinal mass or inflammation. T2 hypointense nodule at the upper pole right kidney as assessed by recent abdominal CT with contrast Disc levels: T12- L1: Spondylosis and facet spurring with moderate right foraminal narrowing, stable L1-L2: Disc collapse and endplate/facet spurring. Patent canal and foramina L2-L3: Solid arthrodesis.  No neural impingement L3-L4: Disc narrowing and bulging with facet spurring eccentric to the left where there is moderate foraminal stenosis. Mild spinal stenosis. L4-L5: Disc narrowing and bulging with degenerative facet spurring asymmetric to the left where there is moderate stenosis. Mild spinal stenosis. L5-S1:Degenerative disc space narrowing and bulging. Bilateral facet spurring. Moderate bilateral foraminal stenosis, mild on the right and moderate to advanced on the left. Asymmetric right subarticular recess narrowing, stable. IMPRESSION: 1. Generalized lumbar spine degeneration similar to 2021. 2. Moderate foraminal stenosis on the left at L3-4 to L5-S1 and on the right at T12-L1. 3. L2-3 solid fusion. Electronically Signed   By: Jorje Guild M.D.   On: 11/19/2021 11:53     ASSESSMENT & PLAN:   Cancer of lower lobe of left lung ([place]) # LLL- Lung cancer-non-small cell stage IV [right suprarenal nodule-s/p biopsy "non-small cell ca"].  S/p  CarboTaxol plus Keytruda  x4; currently on single agent Keytruda-  MAY 27th, 2023-l resolution of previously described focal nodular area of consolidation within the superior segment left lower Lobe; Additional nodular area of consolidation superior segment left lower lobe is slightly decreased in size when compared to prior exam.  Similar right suprarenal nodule.  # Continue Keytruda at this time. Labs today reviewed;  acceptable for treatment today. TSH- June 2023-WNL  # CT MAY 27th, 2023-incidental-  Interval development of patchy ground-glass opacities within the lingula and left upper lobe which may be infectious/inflammatory in etiology.  Monitor for now.  We will repeat imaging in 3 months.  # CT MAY 2023- persistent marked circumferential wall thickening of the ascending and proximal transverse colon most focused about the hepatic flexure.  Patient is asymptomatic.  Last colonoscopy 3 to 4 years ago as per patient.  Awaiting GI/Dr.[name]; [place] for further evaluation on Aug 9th.   # Acute on chronic back pain- BIL radiating lower extremitie- s/p evaluation [[place]]; JUNE 2023 MRI-no cancer; s/p injection.  on tramadol 50 mg BID prn.  on PT.   # Fatigue: Likely secondary to immunotherapy no endocrine dysfunction noted.  On prendisone/intermittent dexamethasone.  # COPD- recommend albutreol prn; adviar BID.   # CAD-s/p CABG-/PVD-s/p stent [Dr.Schneir]-improvement in pain noted.  On Plavix --STABLE;  # IV access: Mediport functioning.  4 weeks-  pref  # DISPOSITION: # Keytruda today; # 1 week-WED-labs- cbc/bmp possible   IVfs 1lit/dex # in 2 Weeks- labs- cbc/bmp; possible IVFs 1 lit/dex  # Follow up in 4 weeks- NP; labs- cbc/cmp;Keytruda;Dr.B       All  questions were answered. The patient knows to call the clinic with any problems, questions or concerns.    Cammie Sickle, MD 12/05/2021 9:29 AM

## 2021-12-05 NOTE — Assessment & Plan Note (Addendum)
#   LLL- Lung cancer-non-small cell stage IV [right suprarenal nodule-s/p biopsy "non-small cell ca"].  S/p  CarboTaxol plus Keytruda x4; currently on single agent Keytruda-  MAY 27th, 2023-l resolution of previously described focal nodular area of consolidation within the superior segment left lower Lobe; Additional nodular area of consolidation superior segment left lower lobe is slightly decreased in size when compared to prior exam.  Similar right suprarenal nodule.  # Continue Keytruda at this time. Labs today reviewed;  acceptable for treatment today. TSH- June 2023-WNL  # CT MAY 27th, 2023-incidental-  Interval development of patchy ground-glass opacities within the lingula and left upper lobe which may be infectious/inflammatory in etiology.  Monitor for now.  We will repeat imaging in 3 months.  # CT MAY 2023- persistent marked circumferential wall thickening of the ascending and proximal transverse colon most focused about the hepatic flexure.  Patient is asymptomatic.  Last colonoscopy 3 to 4 years ago as per patient.  Awaiting GI/Dr.[name]; [place] for further evaluation on Aug 9th.   # Acute on chronic back pain- BIL radiating lower extremitie- s/p evaluation [[place]]; JUNE 2023 MRI-no cancer; s/p injection.  on tramadol 50 mg BID prn.  on PT.   # Fatigue: Likely secondary to immunotherapy no endocrine dysfunction noted.  On prendisone/intermittent dexamethasone.  # COPD- recommend albutreol prn; adviar BID.   # CAD-s/p CABG-/PVD-s/p stent [Dr.Schneir]-improvement in pain noted.  On Plavix --STABLE;  # IV access: Mediport functioning.  4 weeks-  pref  # DISPOSITION: # Keytruda today; # 1 week-WED-labs- cbc/bmp possible   IVfs 1lit/dex # in 2 Weeks- labs- cbc/bmp; possible IVFs 1 lit/dex  # Follow up in 4 weeks- NP; labs- cbc/cmp;Keytruda;Dr.B

## 2021-12-05 NOTE — Patient Instructions (Signed)
[facility]  Discharge Instructions: Thank you for choosing [facility] to provide your oncology and hematology care.  If you have a lab appointment with the Cancer Center, please go directly to the Cancer Center and check in at the registration area.  Wear comfortable clothing and clothing appropriate for easy access to any Portacath or PICC line.   We strive to give you quality time with your provider. You may need to reschedule your appointment if you arrive late (15 or more minutes).  Arriving late affects you and other patients whose appointments are after yours.  Also, if you miss three or more appointments without notifying the office, you may be dismissed from the clinic at the provider's discretion.      For prescription refill requests, have your pharmacy contact our office and allow 72 hours for refills to be completed.    Today you received the following chemotherapy and/or immunotherapy agents Keytruda      To help prevent nausea and vomiting after your treatment, we encourage you to take your nausea medication as directed.  BELOW ARE SYMPTOMS THAT SHOULD BE REPORTED IMMEDIATELY: *FEVER GREATER THAN 100.4 F (38 C) OR HIGHER *CHILLS OR SWEATING *NAUSEA AND VOMITING THAT IS NOT CONTROLLED WITH YOUR NAUSEA MEDICATION *UNUSUAL SHORTNESS OF BREATH *UNUSUAL BRUISING OR BLEEDING *URINARY PROBLEMS (pain or burning when urinating, or frequent urination) *BOWEL PROBLEMS (unusual diarrhea, constipation, pain near the anus) TENDERNESS IN MOUTH AND THROAT WITH OR WITHOUT PRESENCE OF ULCERS (sore throat, sores in mouth, or a toothache) UNUSUAL RASH, SWELLING OR PAIN  UNUSUAL VAGINAL DISCHARGE OR ITCHING   Items with * indicate a potential emergency and should be followed up as soon as possible or go to the Emergency Department if any problems should occur.  Please show the CHEMOTHERAPY ALERT CARD or IMMUNOTHERAPY ALERT CARD at check-in to  the Emergency Department and triage nurse.  Should you have questions after your visit or need to cancel or reschedule your appointment, please contact [facility]  [phone] and follow the prompts.  Office hours are 8:00 a.m. to 4:30 p.m. Monday - Friday. Please note that voicemails left after 4:00 p.m. may not be returned until the following business day.  We are closed weekends and major holidays. You have access to a nurse at all times for urgent questions. Please call the main number to the clinic [phone] and follow the prompts.  For any non-urgent questions, you may also contact your provider using MyChart. We now offer e-Visits for anyone 18 and older to request care online for non-urgent symptoms. For details visit [email or web address].   Also download the MyChart app! Go to the app store, search "MyChart", open the app, select [facility], and log in with your MyChart username and password.  Masks are optional in the cancer centers. If you would like for your care team to wear a mask while they are taking care of you, please let them know. For doctor visits, patients may have with them one support person who is at least 80 years old. At this time, visitors are not allowed in the infusion area.   

## 2021-12-06 DIAGNOSIS — M4726 Other spondylosis with radiculopathy, lumbar region: Secondary | ICD-10-CM | POA: Diagnosis not present

## 2021-12-09 ENCOUNTER — Telehealth: Payer: Self-pay

## 2021-12-09 NOTE — Telephone Encounter (Signed)
Submitted prior auth thru cover my meds. Key: [id number] Case ID: [id number]

## 2021-12-09 NOTE — Telephone Encounter (Signed)
-----   Message from Secundino Ginger sent at 12/09/2021 10:01 AM EDT ----- Regarding: PRIOR AUTHORIZATION - [facility] PRIOR AUTHORIZATION - [facility] SENT TO HIS CHART.

## 2021-12-11 ENCOUNTER — Other Ambulatory Visit: Payer: Self-pay | Admitting: Internal Medicine

## 2021-12-11 ENCOUNTER — Inpatient Hospital Stay: Payer: PPO | Attending: Internal Medicine

## 2021-12-11 ENCOUNTER — Ambulatory Visit
Admission: RE | Admit: 2021-12-11 | Discharge: 2021-12-11 | Disposition: A | Discharge status: Home / Self Care | Payer: PPO | Source: Ambulatory Visit | Attending: Internal Medicine | Admitting: Internal Medicine

## 2021-12-11 ENCOUNTER — Inpatient Hospital Stay: Payer: PPO

## 2021-12-11 VITALS — BP 132/68 | HR 72 | Temp 97.9°F | Resp 18

## 2021-12-11 DIAGNOSIS — Z79899 Other long term (current) drug therapy: Secondary | ICD-10-CM | POA: Insufficient documentation

## 2021-12-11 DIAGNOSIS — R5383 Other fatigue: Secondary | ICD-10-CM | POA: Diagnosis not present

## 2021-12-11 DIAGNOSIS — R059 Cough, unspecified: Secondary | ICD-10-CM | POA: Insufficient documentation

## 2021-12-11 DIAGNOSIS — Z7982 Long term (current) use of aspirin: Secondary | ICD-10-CM | POA: Insufficient documentation

## 2021-12-11 DIAGNOSIS — C3432 Malignant neoplasm of lower lobe, left bronchus or lung: Secondary | ICD-10-CM

## 2021-12-11 DIAGNOSIS — Z87891 Personal history of nicotine dependence: Secondary | ICD-10-CM | POA: Diagnosis not present

## 2021-12-11 DIAGNOSIS — Z5112 Encounter for antineoplastic immunotherapy: Secondary | ICD-10-CM | POA: Insufficient documentation

## 2021-12-11 DIAGNOSIS — R0602 Shortness of breath: Secondary | ICD-10-CM | POA: Diagnosis not present

## 2021-12-11 DIAGNOSIS — Z951 Presence of aortocoronary bypass graft: Secondary | ICD-10-CM | POA: Diagnosis not present

## 2021-12-11 DIAGNOSIS — M4726 Other spondylosis with radiculopathy, lumbar region: Secondary | ICD-10-CM | POA: Diagnosis not present

## 2021-12-11 DIAGNOSIS — R0989 Other specified symptoms and signs involving the circulatory and respiratory systems: Secondary | ICD-10-CM | POA: Diagnosis not present

## 2021-12-11 LAB — COMPREHENSIVE METABOLIC PANEL
ALT: 17 U/L (ref 0–44)
AST: 17 U/L (ref 15–41)
Albumin: 3.9 g/dL (ref 3.5–5.0)
Alkaline Phosphatase: 53 U/L (ref 38–126)
Anion gap: 8 (ref 5–15)
BUN: 23 mg/dL (ref 8–23)
CO2: 24 mmol/L (ref 22–32)
Calcium: 9.2 mg/dL (ref 8.9–10.3)
Chloride: 108 mmol/L (ref 98–111)
Creatinine, Ser: 0.94 mg/dL (ref 0.61–1.24)
GFR, Estimated: >60 mL/min (ref >60)
Glucose, Bld: 107 mg/dL — ABNORMAL HIGH (ref 70–99)
Potassium: 3.8 mmol/L (ref 3.5–5.1)
Sodium: 140 mmol/L (ref 135–145)
Total Bilirubin: 1 mg/dL (ref 0.3–1.2)
Total Protein: 6.8 g/dL (ref 6.5–8.1)

## 2021-12-11 LAB — CBC WITH DIFFERENTIAL/PLATELET
Abs Immature Granulocytes: 0.06 10*3/uL (ref 0.00–0.07)
Basophils Absolute: 0.1 10*3/uL (ref 0.0–0.1)
Basophils Relative: 1 %
Eosinophils Absolute: 0.4 10*3/uL (ref 0.0–0.5)
Eosinophils Relative: 4 %
HCT: 42.8 % (ref 39.0–52.0)
Hemoglobin: 14.4 g/dL (ref 13.0–17.0)
Immature Granulocytes: 1 %
Lymphocytes Relative: 9 %
Lymphs Abs: 1 10*3/uL (ref 0.7–4.0)
MCH: 31.6 pg (ref 26.0–34.0)
MCHC: 33.6 g/dL (ref 30.0–36.0)
MCV: 94.1 fL (ref 80.0–100.0)
Monocytes Absolute: 1 10*3/uL (ref 0.1–1.0)
Monocytes Relative: 8 %
Neutro Abs: 9.1 10*3/uL — ABNORMAL HIGH (ref 1.7–7.7)
Neutrophils Relative %: 77 %
Platelets: 166 10*3/uL (ref 150–400)
RBC: 4.55 MIL/uL (ref 4.22–5.81)
RDW: 12.2 % (ref 11.5–15.5)
WBC: 11.6 10*3/uL — ABNORMAL HIGH (ref 4.0–10.5)
nRBC: 0 % (ref 0.0–0.2)

## 2021-12-11 MED ORDER — SODIUM CHLORIDE 0.9% FLUSH
10.0000 mL | INTRAVENOUS | Status: DC | PRN
  Filled 2021-12-11: qty 10

## 2021-12-11 MED ORDER — DEXAMETHASONE SODIUM PHOSPHATE 10 MG/ML IJ SOLN
4.0000 mg | Freq: Once | INTRAMUSCULAR | Status: AC
  Administered 2021-12-11: 4 mg via INTRAVENOUS
  Filled 2021-12-11: qty 1

## 2021-12-11 MED ORDER — HEPARIN SOD (PORK) LOCK FLUSH 100 UNIT/ML IV SOLN
INTRAVENOUS | Status: AC
  Filled 2021-12-11: qty 5

## 2021-12-11 MED ORDER — HEPARIN SOD (PORK) LOCK FLUSH 100 UNIT/ML IV SOLN
500.0000 [IU] | Freq: Once | INTRAVENOUS | Status: AC
  Administered 2021-12-11: 500 [IU] via INTRAVENOUS
  Filled 2021-12-11: qty 5

## 2021-12-11 MED ORDER — SODIUM CHLORIDE 0.9 % IV SOLN
Freq: Once | INTRAVENOUS | Status: AC
  Filled 2021-12-11: qty 250

## 2021-12-11 MED ORDER — HEPARIN SOD (PORK) LOCK FLUSH 100 UNIT/ML IV SOLN
500.0000 [IU] | Freq: Once | INTRAVENOUS | Status: DC | PRN
  Filled 2021-12-11: qty 5

## 2021-12-11 NOTE — Progress Notes (Signed)
Patient complains of cough shortness of breath-proceed with chest x-ray today.

## 2021-12-11 NOTE — Progress Notes (Signed)
0805: Pt reports feeling as if he needs IVFs and steroids at this time. Per Dr. Rogue Bussing okay to proceed with IVFs and Decadron prior to labs resulting.  Pt reports night sweats that started "several months ago" and a cough and congestion that started "a few days ago". Dr. Rogue Bussing made aware, Per Dr. Rogue Bussing [facility]. Pt aware and agrees with plan.

## 2021-12-12 ENCOUNTER — Other Ambulatory Visit: Payer: PPO

## 2021-12-12 ENCOUNTER — Ambulatory Visit: Payer: PPO

## 2021-12-12 NOTE — Telephone Encounter (Signed)
   Patient Name: Randall Ali.  DOB: 04-19-1941 MRN: [id number]  Primary Cardiologist: [name], MD  Chart reviewed as part of pre-operative protocol coverage.   Discussed Dr. Donivan Scull below recommendations with patient. Patient states he will plan to hold aspirin for 5 days prior to procedure. Explained to patient that procedure is considered higher risk if patient discontinues aspirin perioperatively.  Patient verbalized understanding and agrees to proceed with aspirin hold.   Lenna Sciara, NP 12/12/2021, 8:42 AM

## 2021-12-12 NOTE — Telephone Encounter (Signed)
Patient would like clarification regarding his procedure clearance. The patient is scheduled to have his injection on Monday and would like to know exactly what to do from [name]

## 2021-12-12 NOTE — Telephone Encounter (Signed)
PA for Fluticasone was denied.

## 2021-12-13 ENCOUNTER — Ambulatory Visit (INDEPENDENT_AMBULATORY_CARE_PROVIDER_SITE_OTHER): Payer: PPO | Admitting: Family Medicine

## 2021-12-13 ENCOUNTER — Other Ambulatory Visit: Payer: Self-pay | Admitting: Family Medicine

## 2021-12-13 ENCOUNTER — Encounter: Payer: Self-pay | Admitting: Family Medicine

## 2021-12-13 ENCOUNTER — Ambulatory Visit: Payer: PPO | Admitting: Family Medicine

## 2021-12-13 VITALS — BP 128/68 | HR 72 | Temp 97.6°F | Ht 68.0 in | Wt 218.0 lb

## 2021-12-13 DIAGNOSIS — M4726 Other spondylosis with radiculopathy, lumbar region: Secondary | ICD-10-CM | POA: Diagnosis not present

## 2021-12-13 DIAGNOSIS — J019 Acute sinusitis, unspecified: Secondary | ICD-10-CM

## 2021-12-13 DIAGNOSIS — C3432 Malignant neoplasm of lower lobe, left bronchus or lung: Secondary | ICD-10-CM

## 2021-12-13 DIAGNOSIS — J329 Chronic sinusitis, unspecified: Secondary | ICD-10-CM | POA: Insufficient documentation

## 2021-12-13 LAB — POC COVID19 BINAXNOW: SARS Coronavirus 2 Ag: NEGATIVE

## 2021-12-13 MED ORDER — AMOXICILLIN-POT CLAVULANATE 875-125 MG PO TABS: 1.0000 | ORAL_TABLET | Freq: Two times a day (BID) | ORAL | 0 refills | Status: DC

## 2021-12-13 MED ORDER — AMOXICILLIN-POT CLAVULANATE 875-125 MG PO TABS: 1.0000 | ORAL_TABLET | Freq: Two times a day (BID) | ORAL | 0 refills | Status: AC

## 2021-12-13 NOTE — Patient Instructions (Signed)
COVID test today negative. You have a sinus infection. Take medicine as prescribed: augmentin antibiotic Push fluids and plenty of rest. Nasal saline irrigation or neti pot to help drain sinuses. May use plain mucinex with plenty of fluid to help mobilize mucous. Please let us know if fever >101.5, trouble opening/closing mouth, difficulty swallowing, or worsening instead of improving as expected.

## 2021-12-13 NOTE — Assessment & Plan Note (Addendum)
Continues maintenance Keytruda through oncology, this week's treatment was postponed.

## 2021-12-13 NOTE — Addendum Note (Signed)
Addended by: Ria Bush on: 12/13/2021 04:35 PM   Modules accepted: Orders

## 2021-12-13 NOTE — Telephone Encounter (Signed)
Name of Medication: Tramadol Name of Pharmacy: [facility] or Written Date and Quantity: 10/16/21, #90 Last Office Visit and Type: 12/13/21, cough Next Office Visit and Type: none Last Controlled Substance Agreement Date: 01/18/16 Last UDS: 01/18/16

## 2021-12-13 NOTE — Assessment & Plan Note (Addendum)
Head > chest congestion present for the past 5 days. Suspect URI vs sinusitis. COVID negative today.  In ongoing cancer treatment with immunotherapy and recent dexamethasone, will cover empirically for bacterial sinusitis with augmentin 10d course (pt states 7d is usually not long enough duration to treat his sinus infections).   Continue plain mucinex for symptoms, fluids, rest.  Update if ongoing symptoms despite treatment.

## 2021-12-13 NOTE — Progress Notes (Addendum)
Patient ID: Randall Ali., male    DOB: Nov 01, 1940, 81 y.o.   MRN: [id number]  This visit was conducted in person.  BP 128/68   Pulse 72   Temp 97.6 F (36.4 C) (Temporal)   Ht 5\' 8"  (1.727 m)   Wt 218 lb (98.9 kg)   SpO2 93%   BMI 33.15 kg/m    CC: cold symptoms Subjective:   HPI: Randall Ali. is a 81 y.o. male presenting on 12/13/2021 for Cough (C/o prod cough with mucous and head congestion. Sxs started 12/09/21. Cancer doctor ordered CXR 12/11/21 to r/o pneumonia. Told results normal. )   5d h/o congestion, bad cough productive of dark mucous with coughing fits. Chronic dyspnea. Notes night sweats. Head > chest congestion. Treating with mucinex congestion and cough.   No fevers/chills, ear or tooth pain, wheezing, ST, abd pain, nausea/vomiting.  No sick contacts at home.   No known asthma or COPD. He has advair and albuterol but doesn't feel these are helpful.  Continues pantoprazole 40mg  daily and claritin 10mg  and flonase daily.   He received IV fluids and dexamethasone 4mg  IV on Wednesday morning at cancer center - normal CXR at that time. [name] was postponed at that time.   Stage IV lung cancer diagnosed 10/2020, currently on maintenance Keytruda through oncology.       Relevant past medical, surgical, family and social history reviewed and updated as indicated. Interim medical history since our last visit reviewed. Allergies and medications reviewed and updated. Outpatient Medications Prior to Visit  Medication Sig Dispense Refill   acetaminophen (TYLENOL) 325 MG tablet Take 650 mg by mouth as needed for moderate pain or mild pain.     albuterol (VENTOLIN HFA) 108 (90 Base) MCG/ACT inhaler INHALE 2 PUFFS INTO THE LUNGS EVERY 6 HOURS AS NEEDED FOR WHEEZING OR SHORTNESS OF BREATH 8.5 g 2   Ascorbic Acid (VITAMIN C) 1000 MG tablet Take 1,000 mg by mouth daily.     aspirin 81 MG EC tablet Take 81 mg by mouth daily.       Calcium-Magnesium-Vitamin D (CALCIUM  1200+D3 PO) Take 1 tablet by mouth daily.     Carboxymethylcellul-Glycerin (LUBRICATING EYE DROPS OP) Place 1 drop into both eyes daily as needed (irritation).     Cholecalciferol (VITAMIN D3) 50 MCG (2000 UT) TABS Take 2,000 Units by mouth daily.     Cyanocobalamin (B-12) 5000 MCG CAPS Take 5,000 mcg by mouth daily.     docusate sodium (COLACE) 100 MG capsule Take 200 mg by mouth at bedtime.     fluticasone (FLONASE) 50 MCG/ACT nasal spray Place into both nostrils.     fluticasone-salmeterol (ADVAIR HFA) 115-21 MCG/ACT inhaler Inhale 2 puffs into the lungs 2 (two) times daily. 1 each 3   lidocaine-prilocaine (EMLA) cream Apply 30 -45 mins prior to port access. 30 g 3   loratadine (CLARITIN) 10 MG tablet Take 10 mg by mouth daily.     Melatonin 10 MG CAPS Take 10 mg by mouth at bedtime as needed (sleep).     nitroGLYCERIN (NITROSTAT) 0.4 MG SL tablet Place 1 tablet (0.4 mg total) under the tongue every 5 (five) minutes as needed for up to 25 doses for chest pain. 25 tablet 1   pantoprazole (PROTONIX) 40 MG tablet TAKE 1 TABLET BY MOUTH DAILY 90 tablet 3   prochlorperazine (COMPAZINE) 10 MG tablet Take 1 tablet (10 mg total) by mouth every 6 (six) hours as needed  for nausea or vomiting. 40 tablet 1   simvastatin (ZOCOR) 40 MG tablet Take 1 tablet (40 mg total) by mouth at bedtime. 90 tablet 3   traMADol (ULTRAM) 50 MG tablet Take 1 tablet (50 mg total) by mouth 3 (three) times daily as needed for moderate pain. 90 tablet 0   valsartan (DIOVAN) 80 MG tablet One daily 30 tablet 11   Vitamin E 450 MG (1000 UT) CAPS Take 450 Units by mouth daily.     Zinc 50 MG TABS Take 50 mg by mouth daily.     Facility-Administered Medications Prior to Visit  Medication Dose Route Frequency Provider Last Rate Last Admin   heparin lock flush 100 UNIT/ML injection              Per HPI unless specifically indicated in ROS section below Review of Systems  Objective:  BP 128/68   Pulse 72   Temp 97.6 F (36.4  C) (Temporal)   Ht 5\' 8"  (1.727 m)   Wt 218 lb (98.9 kg)   SpO2 93%   BMI 33.15 kg/m   Wt Readings from Last 3 Encounters:  12/13/21 218 lb (98.9 kg)  12/05/21 215 lb (97.5 kg)  11/11/21 218 lb 6 oz (99.1 kg)      Physical Exam Vitals and nursing note reviewed.  Constitutional:      Appearance: Normal appearance. He is not ill-appearing.  HENT:     Head: Normocephalic and atraumatic.     Right Ear: Hearing, tympanic membrane, ear canal and external ear normal. There is no impacted cerumen.     Left Ear: Hearing, tympanic membrane, ear canal and external ear normal. There is no impacted cerumen.     Ears:     Comments: Wears hearing aides    Nose: Mucosal edema and congestion present. No rhinorrhea.     Right Turbinates: Not enlarged or swollen.     Left Turbinates: Not enlarged or swollen.     Right Sinus: No maxillary sinus tenderness or frontal sinus tenderness.     Left Sinus: No maxillary sinus tenderness or frontal sinus tenderness.     Mouth/Throat:     Mouth: Mucous membranes are moist.     Pharynx: Oropharynx is clear. No oropharyngeal exudate or posterior oropharyngeal erythema.  Eyes:     Extraocular Movements: Extraocular movements intact.     Conjunctiva/sclera: Conjunctivae normal.     Pupils: Pupils are equal, round, and reactive to light.  Cardiovascular:     Rate and Rhythm: Normal rate and regular rhythm.     Pulses: Normal pulses.     Heart sounds: Normal heart sounds. No murmur heard. Pulmonary:     Effort: Pulmonary effort is normal. No respiratory distress.     Breath sounds: Normal breath sounds. No wheezing, rhonchi or rales.     Comments: Largely clear Musculoskeletal:     Cervical back: Normal range of motion and neck supple. No rigidity.     Right lower leg: No edema.     Left lower leg: No edema.  Lymphadenopathy:     Cervical: No cervical adenopathy.  Skin:    General: Skin is warm and dry.     Findings: No rash.  Neurological:      Mental Status: He is alert.  Psychiatric:        Mood and Affect: Mood normal.        Behavior: Behavior normal.       Results for orders placed or  performed in visit on 12/13/21  POC COVID-19 BinaxNow  Result Value Ref Range   SARS Coronavirus 2 Ag Negative Negative   *Note: Due to a large number of results and/or encounters for the requested time period, some results have not been displayed. A complete set of results can be found in Results Review.    Assessment & Plan:   Problem List Items Addressed This Visit     Cancer of lower lobe of left lung ([place])    Continues maintenance Keytruda through oncology, this week's treatment was postponed.       Relevant Medications   amoxicillin-clavulanate (AUGMENTIN) 875-125 MG tablet   Acute sinusitis - Primary    Head > chest congestion present for the past 5 days. Suspect URI vs sinusitis. COVID negative today.  In ongoing cancer treatment with immunotherapy and recent dexamethasone, will cover empirically for bacterial sinusitis with augmentin 10d course (pt states 7d is usually not long enough duration to treat his sinus infections).   Continue plain mucinex for symptoms, fluids, rest.  Update if ongoing symptoms despite treatment.       Relevant Medications   amoxicillin-clavulanate (AUGMENTIN) 875-125 MG tablet   Other Relevant Orders   POC COVID-19 BinaxNow (Completed)     Meds ordered this encounter  Medications   DISCONTD: amoxicillin-clavulanate (AUGMENTIN) 875-125 MG tablet    Sig: Take 1 tablet by mouth 2 (two) times daily for 7 days.    Dispense:  14 tablet    Refill:  0   amoxicillin-clavulanate (AUGMENTIN) 875-125 MG tablet    Sig: Take 1 tablet by mouth 2 (two) times daily for 10 days.    Dispense:  20 tablet    Refill:  0    Use this #   Orders Placed This Encounter  Procedures   POC COVID-19 BinaxNow    Order Specific Question:   Previously tested for COVID-19    Answer:   Yes    Order Specific  Question:   Resident in a congregate (group) care setting    Answer:   No    Order Specific Question:   Employed in healthcare setting    Answer:   No     Patient Instructions  COVID test today negative. You have a sinus infection. Take medicine as prescribed: augmentin antibiotic Push fluids and plenty of rest. Nasal saline irrigation or neti pot to help drain sinuses. May use plain mucinex with plenty of fluid to help mobilize mucous. Please let us know if fever >101.5, trouble opening/closing mouth, difficulty swallowing, or worsening instead of improving as expected.   Follow up plan: Return if symptoms worsen or fail to improve.  Ria Bush, MD

## 2021-12-14 NOTE — Telephone Encounter (Signed)
ERx 

## 2021-12-19 ENCOUNTER — Ambulatory Visit: Payer: PPO

## 2021-12-19 ENCOUNTER — Other Ambulatory Visit: Payer: PPO

## 2021-12-23 DIAGNOSIS — M5416 Radiculopathy, lumbar region: Secondary | ICD-10-CM | POA: Diagnosis not present

## 2021-12-26 ENCOUNTER — Inpatient Hospital Stay: Payer: PPO

## 2021-12-26 VITALS — BP 152/81 | HR 65 | Temp 98.6°F | Resp 16

## 2021-12-26 DIAGNOSIS — C3432 Malignant neoplasm of lower lobe, left bronchus or lung: Secondary | ICD-10-CM

## 2021-12-26 DIAGNOSIS — Z5112 Encounter for antineoplastic immunotherapy: Secondary | ICD-10-CM | POA: Diagnosis not present

## 2021-12-26 LAB — CBC WITH DIFFERENTIAL/PLATELET
Abs Immature Granulocytes: 0.07 10*3/uL (ref 0.00–0.07)
Basophils Absolute: 0.1 10*3/uL (ref 0.0–0.1)
Basophils Relative: 1 %
Eosinophils Absolute: 0.3 10*3/uL (ref 0.0–0.5)
Eosinophils Relative: 4 %
HCT: 40.7 % (ref 39.0–52.0)
Hemoglobin: 13.9 g/dL (ref 13.0–17.0)
Immature Granulocytes: 1 %
Lymphocytes Relative: 23 %
Lymphs Abs: 1.7 10*3/uL (ref 0.7–4.0)
MCH: 31.5 pg (ref 26.0–34.0)
MCHC: 34.2 g/dL (ref 30.0–36.0)
MCV: 92.3 fL (ref 80.0–100.0)
Monocytes Absolute: 0.8 10*3/uL (ref 0.1–1.0)
Monocytes Relative: 11 %
Neutro Abs: 4.3 10*3/uL (ref 1.7–7.7)
Neutrophils Relative %: 60 %
Platelets: 197 10*3/uL (ref 150–400)
RBC: 4.41 MIL/uL (ref 4.22–5.81)
RDW: 12.2 % (ref 11.5–15.5)
WBC: 7.2 10*3/uL (ref 4.0–10.5)
nRBC: 0 % (ref 0.0–0.2)

## 2021-12-26 LAB — BASIC METABOLIC PANEL
Anion gap: 5 (ref 5–15)
BUN: 24 mg/dL — ABNORMAL HIGH (ref 8–23)
CO2: 23 mmol/L (ref 22–32)
Calcium: 8.7 mg/dL — ABNORMAL LOW (ref 8.9–10.3)
Chloride: 109 mmol/L (ref 98–111)
Creatinine, Ser: 0.94 mg/dL (ref 0.61–1.24)
GFR, Estimated: >60 mL/min (ref >60)
Glucose, Bld: 92 mg/dL (ref 70–99)
Potassium: 3.8 mmol/L (ref 3.5–5.1)
Sodium: 137 mmol/L (ref 135–145)

## 2021-12-26 MED ORDER — SODIUM CHLORIDE 0.9 % IV SOLN
Freq: Once | INTRAVENOUS | Status: AC
  Filled 2021-12-26: qty 250

## 2021-12-26 MED ORDER — DEXAMETHASONE SODIUM PHOSPHATE 10 MG/ML IJ SOLN
4.0000 mg | Freq: Once | INTRAMUSCULAR | Status: AC
  Administered 2021-12-26: 4 mg via INTRAVENOUS

## 2021-12-26 MED ORDER — HEPARIN SOD (PORK) LOCK FLUSH 100 UNIT/ML IV SOLN
500.0000 [IU] | Freq: Once | INTRAVENOUS | Status: AC | PRN
  Administered 2021-12-26: 500 [IU]
  Filled 2021-12-26: qty 5

## 2021-12-26 MED ORDER — SODIUM CHLORIDE 0.9% FLUSH
10.0000 mL | Freq: Once | INTRAVENOUS | Status: AC | PRN
  Administered 2021-12-26: 10 mL
  Filled 2021-12-26: qty 10

## 2021-12-30 ENCOUNTER — Other Ambulatory Visit: Payer: Self-pay | Admitting: Family Medicine

## 2021-12-30 DIAGNOSIS — M4726 Other spondylosis with radiculopathy, lumbar region: Secondary | ICD-10-CM | POA: Diagnosis not present

## 2021-12-31 ENCOUNTER — Telehealth: Payer: Self-pay

## 2021-12-31 ENCOUNTER — Other Ambulatory Visit: Payer: Self-pay | Admitting: *Deleted

## 2021-12-31 ENCOUNTER — Inpatient Hospital Stay: Payer: PPO

## 2021-12-31 VITALS — BP 151/75 | HR 75 | Temp 98.9°F | Resp 17

## 2021-12-31 DIAGNOSIS — Z5112 Encounter for antineoplastic immunotherapy: Secondary | ICD-10-CM | POA: Diagnosis not present

## 2021-12-31 DIAGNOSIS — C3432 Malignant neoplasm of lower lobe, left bronchus or lung: Secondary | ICD-10-CM

## 2021-12-31 LAB — CBC WITH DIFFERENTIAL/PLATELET
Abs Immature Granulocytes: 0.11 10*3/uL — ABNORMAL HIGH (ref 0.00–0.07)
Basophils Absolute: 0.1 10*3/uL (ref 0.0–0.1)
Basophils Relative: 1 %
Eosinophils Absolute: 0.4 10*3/uL (ref 0.0–0.5)
Eosinophils Relative: 5 %
HCT: 45.6 % (ref 39.0–52.0)
Hemoglobin: 15.4 g/dL (ref 13.0–17.0)
Immature Granulocytes: 1 %
Lymphocytes Relative: 14 %
Lymphs Abs: 1.4 10*3/uL (ref 0.7–4.0)
MCH: 30.7 pg (ref 26.0–34.0)
MCHC: 33.8 g/dL (ref 30.0–36.0)
MCV: 91 fL (ref 80.0–100.0)
Monocytes Absolute: 1.1 10*3/uL — ABNORMAL HIGH (ref 0.1–1.0)
Monocytes Relative: 12 %
Neutro Abs: 6.3 10*3/uL (ref 1.7–7.7)
Neutrophils Relative %: 67 %
Platelets: 223 10*3/uL (ref 150–400)
RBC: 5.01 MIL/uL (ref 4.22–5.81)
RDW: 12.2 % (ref 11.5–15.5)
WBC: 9.4 10*3/uL (ref 4.0–10.5)
nRBC: 0 % (ref 0.0–0.2)

## 2021-12-31 LAB — COMPREHENSIVE METABOLIC PANEL
ALT: 18 U/L (ref 0–44)
AST: 17 U/L (ref 15–41)
Albumin: 3.9 g/dL (ref 3.5–5.0)
Alkaline Phosphatase: 53 U/L (ref 38–126)
Anion gap: 5 (ref 5–15)
BUN: 18 mg/dL (ref 8–23)
CO2: 24 mmol/L (ref 22–32)
Calcium: 8.8 mg/dL — ABNORMAL LOW (ref 8.9–10.3)
Chloride: 105 mmol/L (ref 98–111)
Creatinine, Ser: 0.87 mg/dL (ref 0.61–1.24)
GFR, Estimated: >60 mL/min (ref >60)
Glucose, Bld: 94 mg/dL (ref 70–99)
Potassium: 4 mmol/L (ref 3.5–5.1)
Sodium: 134 mmol/L — ABNORMAL LOW (ref 135–145)
Total Bilirubin: 0.5 mg/dL (ref 0.3–1.2)
Total Protein: 7.1 g/dL (ref 6.5–8.1)

## 2021-12-31 MED ORDER — SODIUM CHLORIDE 0.9 % IV SOLN
Freq: Once | INTRAVENOUS | Status: AC
  Filled 2021-12-31: qty 250

## 2021-12-31 MED ORDER — DEXAMETHASONE SODIUM PHOSPHATE 10 MG/ML IJ SOLN
4.0000 mg | Freq: Once | INTRAMUSCULAR | Status: AC
  Administered 2021-12-31: 10 mg via INTRAVENOUS
  Filled 2021-12-31: qty 1

## 2021-12-31 MED ORDER — SODIUM CHLORIDE 0.9% FLUSH
10.0000 mL | Freq: Once | INTRAVENOUS | Status: AC | PRN
  Administered 2021-12-31: 10 mL
  Filled 2021-12-31: qty 10

## 2021-12-31 MED ORDER — HEPARIN SOD (PORK) LOCK FLUSH 100 UNIT/ML IV SOLN
500.0000 [IU] | Freq: Once | INTRAVENOUS | Status: AC | PRN
  Administered 2021-12-31: 500 [IU]
  Filled 2021-12-31: qty 5

## 2021-12-31 NOTE — Telephone Encounter (Signed)
Patient called stating that he is "feeling real bad" for over a week. He says that he got fluids and steroids last week but they didn't help. He wants to know what he should do. Callback# [phone]

## 2021-12-31 NOTE — Telephone Encounter (Signed)
Spoke with Merrily Pew, NP- order port labs/ hydration only (cbc/metc). I spoke with patient he is agreeable.

## 2021-12-31 NOTE — Telephone Encounter (Signed)
Left vm for pt to give [place] a call back to get more info on his symptoms.

## 2021-12-31 NOTE — Progress Notes (Signed)
Patient added on for clinic visit for labs and IVF's. Reports having no energy at all. Eating and drinking well. B/B WNL's. Pt has a part time job, plays softball and works out in gym every morning.Received 1 L NS + dexamethasone. VSS.

## 2022-01-01 ENCOUNTER — Other Ambulatory Visit: Payer: Self-pay

## 2022-01-01 DIAGNOSIS — C3432 Malignant neoplasm of lower lobe, left bronchus or lung: Secondary | ICD-10-CM

## 2022-01-01 NOTE — Telephone Encounter (Signed)
E-scribed refill.  Pt had wellness on 09/26/21.  Plz schedule CPE and lab visits for additional refills.

## 2022-01-01 NOTE — Telephone Encounter (Signed)
Patient has been scheduled

## 2022-01-02 ENCOUNTER — Inpatient Hospital Stay: Payer: PPO

## 2022-01-02 ENCOUNTER — Inpatient Hospital Stay (HOSPITAL_BASED_OUTPATIENT_CLINIC_OR_DEPARTMENT_OTHER): Payer: PPO | Admitting: Medical Oncology

## 2022-01-02 ENCOUNTER — Encounter: Payer: Self-pay | Admitting: Medical Oncology

## 2022-01-02 VITALS — BP 124/71 | HR 79 | Temp 99.5°F | Ht 68.0 in | Wt 216.4 lb

## 2022-01-02 DIAGNOSIS — C3432 Malignant neoplasm of lower lobe, left bronchus or lung: Secondary | ICD-10-CM | POA: Diagnosis not present

## 2022-01-02 DIAGNOSIS — R5383 Other fatigue: Secondary | ICD-10-CM

## 2022-01-02 DIAGNOSIS — Z5112 Encounter for antineoplastic immunotherapy: Secondary | ICD-10-CM | POA: Diagnosis not present

## 2022-01-02 LAB — CBC WITH DIFFERENTIAL/PLATELET
Abs Immature Granulocytes: 0.13 10*3/uL — ABNORMAL HIGH (ref 0.00–0.07)
Basophils Absolute: 0.1 10*3/uL (ref 0.0–0.1)
Basophils Relative: 1 %
Eosinophils Absolute: 0.2 10*3/uL (ref 0.0–0.5)
Eosinophils Relative: 2 %
HCT: 44.2 % (ref 39.0–52.0)
Hemoglobin: 14.9 g/dL (ref 13.0–17.0)
Immature Granulocytes: 1 %
Lymphocytes Relative: 19 %
Lymphs Abs: 1.9 10*3/uL (ref 0.7–4.0)
MCH: 31.2 pg (ref 26.0–34.0)
MCHC: 33.7 g/dL (ref 30.0–36.0)
MCV: 92.7 fL (ref 80.0–100.0)
Monocytes Absolute: 1 10*3/uL (ref 0.1–1.0)
Monocytes Relative: 10 %
Neutro Abs: 6.7 10*3/uL (ref 1.7–7.7)
Neutrophils Relative %: 67 %
Platelets: 225 10*3/uL (ref 150–400)
RBC: 4.77 MIL/uL (ref 4.22–5.81)
RDW: 12.5 % (ref 11.5–15.5)
WBC: 10.1 10*3/uL (ref 4.0–10.5)
nRBC: 0 % (ref 0.0–0.2)

## 2022-01-02 LAB — COMPREHENSIVE METABOLIC PANEL
ALT: 16 U/L (ref 0–44)
AST: 18 U/L (ref 15–41)
Albumin: 3.8 g/dL (ref 3.5–5.0)
Alkaline Phosphatase: 54 U/L (ref 38–126)
Anion gap: 8 (ref 5–15)
BUN: 22 mg/dL (ref 8–23)
CO2: 22 mmol/L (ref 22–32)
Calcium: 9.1 mg/dL (ref 8.9–10.3)
Chloride: 109 mmol/L (ref 98–111)
Creatinine, Ser: 1.26 mg/dL — ABNORMAL HIGH (ref 0.61–1.24)
GFR, Estimated: 58 mL/min — ABNORMAL LOW (ref >60)
Glucose, Bld: 102 mg/dL — ABNORMAL HIGH (ref 70–99)
Potassium: 3.8 mmol/L (ref 3.5–5.1)
Sodium: 139 mmol/L (ref 135–145)
Total Bilirubin: 0.7 mg/dL (ref 0.3–1.2)
Total Protein: 6.7 g/dL (ref 6.5–8.1)

## 2022-01-02 MED ORDER — SODIUM CHLORIDE 0.9 % IV SOLN
Freq: Once | INTRAVENOUS | Status: AC
  Filled 2022-01-02: qty 250

## 2022-01-02 MED ORDER — HEPARIN SOD (PORK) LOCK FLUSH 100 UNIT/ML IV SOLN
500.0000 [IU] | Freq: Once | INTRAVENOUS | Status: DC
  Filled 2022-01-02: qty 5

## 2022-01-02 MED ORDER — SODIUM CHLORIDE 0.9% FLUSH
10.0000 mL | Freq: Once | INTRAVENOUS | Status: AC
  Administered 2022-01-02: 10 mL via INTRAVENOUS
  Filled 2022-01-02: qty 10

## 2022-01-02 MED ORDER — SODIUM CHLORIDE 0.9 % IV SOLN
200.0000 mg | Freq: Once | INTRAVENOUS | Status: AC
  Administered 2022-01-02: 200 mg via INTRAVENOUS
  Filled 2022-01-02: qty 8

## 2022-01-02 MED ORDER — HEPARIN SOD (PORK) LOCK FLUSH 100 UNIT/ML IV SOLN
500.0000 [IU] | Freq: Once | INTRAVENOUS | Status: AC | PRN
  Administered 2022-01-02: 500 [IU]
  Filled 2022-01-02: qty 5

## 2022-01-02 NOTE — Progress Notes (Signed)
C/o feeling weak and tired. He states the lower does of the steroid with fluids didn't help like the full dose did.  Temp today 99.5, states getting over sinus infection.

## 2022-01-02 NOTE — Progress Notes (Signed)
[facility] NOTE  Patient Care Team: Ria Bush, MD as PCP - General (Family Medicine) [name] [name], MD as PCP - Cardiology (Cardiology) [name], MD (Internal Medicine) Minna Merritts, MD as Consulting Physician (Cardiology) Pieter Partridge, DO as Consulting Physician (Neurology) Cammie Sickle, MD as Consulting Physician (Hematology and Oncology) Telford Nab, RN as Oncology Nurse Navigator  CHIEF COMPLAINTS/PURPOSE OF CONSULTATION: lung cancer    Oncology History Overview Note  #MAY 2022-Lung cancer-non-small cell [CT guided bx] T3N1 vs stage IV Dr.Hendrickson.  MRI brain negative for malignancy.# 1. April 2022- LLL ~4.0 cm mass in the superior segment left lower lobe abuts the major fissure and the posterior pleural surface without visible chest wall invasion without pleural effusion. There 2-3 other small nodules in the left lower lobe, largest measures 9 mm in diameter. These are suspicious for same lobe satellite lesions and there is left hilar adenopathy. Assuming non-small cell lung cancer the appearance is compatible with T3 N1 M0 disease (stage IIIA).  # right suprarenal nodule-awaiting biopsy on 5/31- non-small cell [revived at tumor conference at [facility]  # June 9th 2022- CARBO-TAXOl-KEYTRUDA; Fulphila  # ? Subtle adrenal insufficiency- No acute process - -September MRI brain negative for any pituitary hypophysitis/brain metastasis. Prednisone   MOLECULAR TESTING: NGS TPS-PDL 100%; EXON 12 amplification* mutations.    # CAD [CABG 2016; Dr.Gollan]   Cancer of lower lobe of left lung ([place])  11/02/2020 Initial Diagnosis   Cancer of lower lobe of left lung ([place])   11/02/2020 Cancer Staging   Staging form: Lung, AJCC 8th Edition - Clinical: Stage IVA (cT3, cN1, cM1a) - Signed by Cammie Sickle, MD on 11/02/2020   11/16/2020 -  Chemotherapy   Patient is on Treatment Plan : LUNG NSCLC Carboplatin + Paclitaxel +  Pembrolizumab q21d x 4 cycles / Pembrolizumab Maintenance Q21D        HISTORY OF PRESENTING ILLNESS: Alone. walking independently.  Randall Ali. 81 y.o.  male lung cancer-non-small cell stage IV [supra-renal nodule s/p biopsy] currently on Keytruda is for follow-up.  Patient reports that other than his chronic fatigue he is doing well.  Denies any tingling or numbness in the extremities. No nausea no vomiting no headaches.  Review of Systems  Constitutional:  Positive for malaise/fatigue. Negative for chills, diaphoresis, fever and weight loss.  HENT:  Negative for nosebleeds and sore throat.   Eyes:  Negative for double vision.  Respiratory:  Negative for cough, hemoptysis, sputum production, shortness of breath and wheezing.   Cardiovascular:  Negative for chest pain, palpitations, orthopnea and leg swelling.  Gastrointestinal:  Positive for nausea. Negative for abdominal pain, blood in stool, constipation, diarrhea, heartburn, melena and vomiting.  Genitourinary:  Negative for dysuria, frequency and urgency.  Musculoskeletal:  Positive for back pain and joint pain.  Skin: Negative.  Negative for itching and rash.  Neurological:  Negative for dizziness, tingling, focal weakness, weakness and headaches.  Endo/Heme/Allergies:  Does not bruise/bleed easily.  Psychiatric/Behavioral:  Negative for depression. The patient is not nervous/anxious and does not have insomnia.      MEDICAL HISTORY:  Past Medical History:  Diagnosis Date   Allergy    seasonal   Arthritis    all over- in general    CAD (coronary artery disease)    a. inferior wall MI 10/01 s/p PCI/DES to RCA; b. Myoview 3/16 neg for ischemia; c. LHC 8/16: ostLAD 80%, OM1 70%, OM2 70% x 2 lesions,  mRCA 30%, dRCA 70% s/p 4-V CABG 01/24/15 (LIMA-LAD, VG- OM1, VG-OM2, VG-PDA)    Cancer (HCC)    skin, melanoma   Carotid artery disease ([place])    a. [place] 8/16: 1-39% bilateral ICA stenosis   Cataract    removed   Diastolic  dysfunction    a. TTE 8/16: EF 55-60%, no RWMA, Gr1DD, calcified mitral annulus, mild biatrial enlargement   Erectile dysfunction    GERD (gastroesophageal reflux disease)    History of elbow surgery    History of hiatal hernia    HLD (hyperlipidemia)    HTN (hypertension)    Inferior myocardial infarction ([place]) 03/2000   stent RCA   Lung cancer ([place])    Postoperative wound infection 02/02/2015   Reflux esophagitis    Sleep apnea 2017   CPAP at night    SURGICAL HISTORY: Past Surgical History:  Procedure Laterality Date   arm surgery  2010   BROW LIFT Bilateral 11/25/2019   Procedure: BROW PTOSIS REPAIR BILATERAL;  Surgeon: Karle Starch, MD;  Location: [facility];  Service: Ophthalmology;  Laterality: Bilateral;  sleep apnea   CARDIAC CATHETERIZATION  06/24/2011   CARDIAC CATHETERIZATION N/A 01/18/2015   Procedure: Left Heart Cath with coronary angiography;  Surgeon: Minna Merritts, MD;  Location: [facility];  Service: Cardiovascular;  Laterality: N/A;   CARDIAC CATHETERIZATION N/A 01/18/2015   Procedure: Intravascular Pressure Wire/FFR Study;  Surgeon: [name], MD;  Location: [facility];  Service: Cardiovascular;  Laterality: N/A;   CAROTID STENT  03/10/2011   COLONOSCOPY  2010   COLONOSCOPY  06/14/2014   Dr Hilarie Fredrickson   CORONARY ARTERY BYPASS GRAFT N/A 01/24/2015   Procedure: CORONARY ARTERY BYPASS GRAFTING x 4 (LIMA-LAD, SVG-Int 1- Int 2, SVG-PD) ENDOSCOPIC GREATER SAPHENOUS VEIN HARVEST LEFT LEG;  Surgeon: [name], MD;  Location: [facility];  Service: Open Heart Surgery;  Laterality: N/A;   EMBOLECTOMY  06/15/2019   Procedure: EMBOLECTOMY;  Surgeon: Katha Cabal, MD;  Location: [facility];  Service: Vascular;;  right superficial femoral artery   ENDARTERECTOMY FEMORAL Right 06/15/2019   Procedure: ENDARTERECTOMY FEMORAL;  Surgeon: Katha Cabal, MD;  Location: [facility];  Service: Vascular;  Laterality: Right;  common  femoral profunda femoris superficial femoral   ESOPHAGOGASTRODUODENOSCOPY (EGD) WITH PROPOFOL N/A 04/24/2016   Procedure: ESOPHAGOGASTRODUODENOSCOPY (EGD) WITH PROPOFOL;  Surgeon: Jerene Bears, MD;  Location: [facility];  Service: Gastroenterology;  Laterality: N/A;   EYE SURGERY     lasik 15 yrs. ago, cataracts removed - both eyes    HAMMER TOE SURGERY     right toe   INSERTION OF ILIAC STENT Right 06/15/2019   Procedure: INSERTION OF ILIAC STENT ( STENTING OF SFA/POP ARTERY );  Surgeon: Katha Cabal, MD;  Location: [facility];  Service: Vascular;  Laterality: Right;  angioplpasty and stent placement: right superficial femoral right tibiopopliteal trunk bilateral common iliac arteries   IR IMAGING GUIDED PORT INSERTION  11/09/2020   LEFT HEART CATH AND CORONARY ANGIOGRAPHY Left 06/10/2017   Procedure: LEFT HEART CATH AND CORONARY ANGIOGRAPHY;  Surgeon: Minna Merritts, MD;  Location: [facility];  Service: Cardiovascular;  Laterality: Left;   LOWER EXTREMITY ANGIOGRAPHY Left 01/04/2019   Procedure: LOWER EXTREMITY ANGIOGRAPHY;  Surgeon: Katha Cabal, MD;  Location: [facility];  Service: Cardiovascular;  Laterality: Left;   LOWER EXTREMITY ANGIOGRAPHY Right 01/25/2019   Procedure: LOWER EXTREMITY ANGIOGRAPHY;  Surgeon: Katha Cabal,  MD;  Location: [facility];  Service: Cardiovascular;  Laterality: Right;   LOWER EXTREMITY ANGIOGRAPHY Right 01/15/2021   LOWER EXTREMITY ANGIOGRAPHY and stent placement to R SFA and popliteal artery Delana Meyer, Dolores Lory, MD)   NASAL SINUS SURGERY  2008   septpolasty, bilateral turbinate reduction   SHOULDER ARTHROSCOPY  2012   TEE WITHOUT CARDIOVERSION N/A 01/24/2015   Procedure: TRANSESOPHAGEAL ECHOCARDIOGRAM (TEE);  Surgeon: [name], MD;  Location: [facility];  Service: Open Heart Surgery;  Laterality: N/A;   TOE SURGERY  1994   UPPER GI ENDOSCOPY  07/2014, 04-24-16   Dr Raquel James   WRIST SURGERY  2011     SOCIAL HISTORY: Social History   Socioeconomic History   Marital status: Single    Spouse name: Not on file   Number of children: Not on file   Years of education: 12   Highest education level: Not on file  Occupational History   Occupation: retired    Comment: [employer]  Tobacco Use   Smoking status: Former    Packs/day: 2.50    Years: 40.00    Total pack years: 100.00    Types: Cigarettes    Quit date: 03/24/2000    Years since quitting: 21.7   Smokeless tobacco: Never  Vaping Use   Vaping Use: Never used  Substance and Sexual Activity   Alcohol use: Yes    Comment: occassionally   Drug use: No   Sexual activity: Yes  Other Topics Concern   Not on file  Social History Narrative   Singled; lives with son and dog    Occ: retired, back part time at [employer];    Activity: gym 4-5d/wk   Diet: good water, fruits/vegetables daily   Caffeine Use-yes      ------------------------------------       Car sales- retd; [employer]- retd; quit smoking 2001. Alcohol couple nights a week. Live in [place]. Daughter lives 10 mins.    Social Determinants of Health   Financial Resource Strain: Low Risk  (09/26/2021)   Overall Financial Resource Strain (CARDIA)    Difficulty of Paying Living Expenses: Not hard at all  Food Insecurity: No Food Insecurity (09/26/2021)   Hunger Vital Sign    Worried About Running Out of Food in the Last Year: Never true    Ran Out of Food in the Last Year: Never true  Transportation Needs: No Transportation Needs (09/26/2021)   PRAPARE - Hydrologist (Medical): No    Lack of Transportation (Non-Medical): No  Physical Activity: Sufficiently Active (09/26/2021)   Exercise Vital Sign    Days of Exercise per Week: 5 days    Minutes of Exercise per Session: 30 min  Stress: No Stress Concern Present (09/26/2021)   [place]    Feeling of Stress : Only a  little  Social Connections: Socially Isolated (09/26/2021)   Social Connection and Isolation Panel [NHANES]    Frequency of Communication with Friends and Family: Twice a week    Frequency of Social Gatherings with Friends and Family: Twice a week    Attends Religious Services: Never    Marine scientist or Organizations: Not on file    Attends Archivist Meetings: Never    Marital Status: Never married  Intimate Partner Violence: Not At Risk (09/26/2021)   Humiliation, Afraid, Rape, and Kick questionnaire    Fear of Current or Ex-Partner: No  Emotionally Abused: No    Physically Abused: No    Sexually Abused: No    FAMILY HISTORY: Family History  Problem Relation Age of Onset   Hypertension Mother    Heart disease Mother    Hypertension Father    Diabetes Father    Lymphoma Sister    Heart disease Brother 46   Cancer Paternal Grandfather    Colon cancer Neg Hx    Prostate cancer Neg Hx    Bladder Cancer Neg Hx    Kidney cancer Neg Hx     ALLERGIES:  has No Known Allergies.  MEDICATIONS:  Current Outpatient Medications  Medication Sig Dispense Refill   acetaminophen (TYLENOL) 325 MG tablet Take 650 mg by mouth as needed for moderate pain or mild pain.     albuterol (VENTOLIN HFA) 108 (90 Base) MCG/ACT inhaler INHALE 2 PUFFS INTO THE LUNGS EVERY 6 HOURS AS NEEDED FOR WHEEZING OR SHORTNESS OF BREATH 8.5 g 2   Ascorbic Acid (VITAMIN C) 1000 MG tablet Take 1,000 mg by mouth daily.     aspirin 81 MG EC tablet Take 81 mg by mouth daily.       Calcium-Magnesium-Vitamin D (CALCIUM 1200+D3 PO) Take 1 tablet by mouth daily.     Carboxymethylcellul-Glycerin (LUBRICATING EYE DROPS OP) Place 1 drop into both eyes daily as needed (irritation).     Cholecalciferol (VITAMIN D3) 50 MCG (2000 UT) TABS Take 2,000 Units by mouth daily.     Cyanocobalamin (B-12) 5000 MCG CAPS Take 5,000 mcg by mouth daily.     docusate sodium (COLACE) 100 MG capsule Take 200 mg by mouth at  bedtime.     fluticasone (FLONASE) 50 MCG/ACT nasal spray Place into both nostrils.     fluticasone-salmeterol (ADVAIR HFA) 115-21 MCG/ACT inhaler Inhale 2 puffs into the lungs 2 (two) times daily. 1 each 3   lidocaine-prilocaine (EMLA) cream Apply 30 -45 mins prior to port access. 30 g 3   loratadine (CLARITIN) 10 MG tablet Take 10 mg by mouth daily.     Melatonin 10 MG CAPS Take 10 mg by mouth at bedtime as needed (sleep).     nitroGLYCERIN (NITROSTAT) 0.4 MG SL tablet Place 1 tablet (0.4 mg total) under the tongue every 5 (five) minutes as needed for up to 25 doses for chest pain. 25 tablet 1   pantoprazole (PROTONIX) 40 MG tablet TAKE 1 TABLET BY MOUTH DAILY 90 tablet 0   prochlorperazine (COMPAZINE) 10 MG tablet Take 1 tablet (10 mg total) by mouth every 6 (six) hours as needed for nausea or vomiting. 40 tablet 1   simvastatin (ZOCOR) 40 MG tablet Take 1 tablet (40 mg total) by mouth at bedtime. 90 tablet 3   traMADol (ULTRAM) 50 MG tablet TAKE 1 TABLET BY MOUTH 3 TIMES DAILY AS NEEDED FOR MODERATE PAIN 90 tablet 0   valsartan (DIOVAN) 80 MG tablet One daily 30 tablet 11   Vitamin E 450 MG (1000 UT) CAPS Take 450 Units by mouth daily.     Zinc 50 MG TABS Take 50 mg by mouth daily.     No current facility-administered medications for this visit.   Facility-Administered Medications Ordered in Other Visits  Medication Dose Route Frequency Provider Last Rate Last Admin   heparin lock flush 100 UNIT/ML injection            heparin lock flush 100 unit/mL  500 Units Intravenous Once Cammie Sickle, MD          .  PHYSICAL EXAMINATION: ECOG PERFORMANCE STATUS: 0 - Asymptomatic  Vitals:   01/02/22 0832  BP: 124/71  Pulse: 79  Temp: 99.5 F (37.5 C)  SpO2: 94%      Filed Weights   01/02/22 0832  Weight: 216 lb 6.4 oz (98.2 kg)    Physical Exam HENT:     Head: Normocephalic and atraumatic.     Mouth/Throat:     Pharynx: No oropharyngeal exudate.  Eyes:     Pupils:  Pupils are equal, round, and reactive to light.  Cardiovascular:     Rate and Rhythm: Normal rate and regular rhythm.  Pulmonary:     Effort: No respiratory distress.     Breath sounds: No wheezing.     Comments: Decreased air entry bilaterally. Abdominal:     General: Bowel sounds are normal. There is no distension.     Palpations: Abdomen is soft. There is no mass.     Tenderness: There is no abdominal tenderness. There is no guarding or rebound.  Musculoskeletal:        General: No tenderness. Normal range of motion.     Cervical back: Normal range of motion and neck supple.  Skin:    General: Skin is warm.  Neurological:     Mental Status: He is alert and oriented to person, place, and time.  Psychiatric:        Mood and Affect: Affect normal.     LABORATORY DATA:  I have reviewed the data as listed Lab Results  Component Value Date   WBC 10.1 01/02/2022   HGB 14.9 01/02/2022   HCT 44.2 01/02/2022   MCV 92.7 01/02/2022   PLT 225 01/02/2022   Recent Labs    12/05/21 0834 12/11/21 0752 12/26/21 1330 12/31/21 1040  NA 139 140 137 134*  K 4.3 3.8 3.8 4.0  CL 107 108 109 105  CO2 24 24 23 24   GLUCOSE 108* 107* 92 94  BUN 25* 23 24* 18  CREATININE 0.97 0.94 0.94 0.87  CALCIUM 9.2 9.2 8.7* 8.8*  GFRNONAA >60 >60 >60 >60  PROT 6.9 6.8  --  7.1  ALBUMIN 4.0 3.9  --  3.9  AST 17 17  --  17  ALT 17 17  --  18  ALKPHOS 52 53  --  53  BILITOT 0.6 1.0  --  0.5     RADIOGRAPHIC STUDIES: I have personally reviewed the radiological images as listed and agreed with the findings in the report. DG Chest 2 View  Result Date: 12/11/2021 CLINICAL DATA:  cough/shortness of breath EXAM: CHEST - 2 VIEW COMPARISON:  August 30, 2021. FINDINGS: No consolidation. No visible pleural effusions or pneumothorax. Right Port-A-Cath in similar position. CABG and median sternotomy. Cardiomediastinal silhouette is unchanged. IMPRESSION: No evidence of acute cardiopulmonary disease.  Electronically Signed   By: Margaretha Sheffield M.D.   On: 12/11/2021 12:00     ASSESSMENT & PLAN:  Encounter Diagnoses  Name Primary?   Cancer of lower lobe of left lung (HCC) Yes   Fatigue, unspecified type    Chronic, On Keytruda. Tolerating well. Labs reviewed and discussed. Creatinine is elevated- pushing fluids. Will recheck next week and given IVF.  Chronic. Stable. DEX helps some.   Today IVF, Keytruda 1 week-WED-labs- cbc/bmp possible   IVfs 1lit/dex 2 Weeks- labs- cbc/bmp; possible IVFs 1 lit/dex  Follow up in 4 weeks- NP; labs- cbc/cmp;Keytruda- [place]    All  questions were answered. The patient knows to call  the clinic with any problems, questions or concerns.    Hughie Closs, PA-C 01/02/2022 9:19 AM

## 2022-01-03 ENCOUNTER — Encounter: Payer: Self-pay | Admitting: Internal Medicine

## 2022-01-03 ENCOUNTER — Ambulatory Visit: Payer: PPO | Admitting: Internal Medicine

## 2022-01-03 DIAGNOSIS — I10 Essential (primary) hypertension: Secondary | ICD-10-CM | POA: Diagnosis not present

## 2022-01-03 DIAGNOSIS — G4733 Obstructive sleep apnea (adult) (pediatric): Secondary | ICD-10-CM | POA: Diagnosis not present

## 2022-01-03 DIAGNOSIS — R0609 Other forms of dyspnea: Secondary | ICD-10-CM

## 2022-01-03 MED ORDER — ALBUTEROL SULFATE HFA 108 (90 BASE) MCG/ACT IN AERS: 2.0000 | INHALATION_SPRAY | Freq: Four times a day (QID) | RESPIRATORY_TRACT | 2 refills | Status: DC | PRN

## 2022-01-03 NOTE — Assessment & Plan Note (Addendum)
D/c ACEi  10/31/2021 due to un-explained dyspnea/ globus sensation> much better 01/03/2022   Although even in retrospect it may not be clear the ACEi contributed to the pt's symptoms,  He very clearly  improved off them and adding them back at this point or in the future would risk confusion in interpretation of non-specific respiratory symptoms to which this patient is prone  ie  Better not to muddy the waters here.   >>> continue diovan 80 mg daily and f/u with PCP in Oct 2023 as planned

## 2022-01-03 NOTE — Progress Notes (Signed)
Randall Lofts., male    DOB: Oct 19, 1940   MRN: [id number]   Brief patient profile:  62  yowm  quit smoking 2001  referred to pulmonary clinic in [facility]  10/31/2021 by Dr Dalbert Mayotte for variable doe with PFT 's nl 2016 x for ERV 13% @ wt  29  Sees Dr Mortimer Fries for OSA    HEMATOLOGY/ONCOLOGY HISTORY:      Oncology History Overview Note   #MAY 2022-Lung cancer-non-small cell [CT guided bx] T3N1 vs stage IV Dr.Hendrickson.  MRI brain negative for malignancy.# 1. April 2022- LLL ~4.0 cm mass in the superior segment left lower lobe abuts the major fissure and the posterior pleural surface without visible chest wall invasion without pleural effusion. There 2-3 other small nodules in the left lower lobe, largest measures 9 mm in diameter. These are suspicious for same lobe satellite lesions and there is left hilar adenopathy. Assuming non-small cell lung cancer the appearance is compatible with T3 N1 M0 disease (stage IIIA).   # right suprarenal nodule-awaiting biopsy on 5/31- non-small cell [revived at tumor conference at [place]   # June 9th 2022- CARBO-TAXOl-KEYTRUDA; Fulphila   # ? Subtle adrenal insufficiency- No acute process - -September MRI brain negative for any pituitary hypophysitis/brain metastasis. Prednisone    MOLECULAR TESTING: NGS TPS-PDL 100%; EXON 12 amplification* mutations.       # CAD [CABG 2016; Dr.Gollan]    Cancer of lower lobe of left lung ([place])   11/02/2020 Initial Diagnosis     Cancer of lower lobe of left lung ([place])     11/02/2020 Cancer Staging     Staging form: Lung, AJCC 8th Edition - Clinical: Stage IVA (cT3, cN1, cM1a) - Signed by Cammie Sickle, MD on 11/02/2020     11/16/2020 -  Chemotherapy     Patient is on Treatment Plan : LUNG NSCLC Carboplatin + Paclitaxel + Pembrolizumab q21d x 4 cycles / Pembrolizumab Maintenance Q21D          History of Present Illness  10/31/2021  Pulmonary/ 1st office eval/ Randall Ali / [place]  Office at wt 21  Chief  Complaint  Patient presents with   Follow-up    Patient feels like he is having more shortness of breath with activity and states "he is breathing real hard" Patient has CPAP and is sleeping good.  Dyspnea:  legs stop before breathing after 200  ft (2 aisles) / work out at gym rides bike for 30 min s stopping twice weekly at yet variably doe 50 ft at hme  Cough: none but variable congestion in throat /globus sensation  Sleep: fine on cpap per [facility] SABA use: saba helps variable  "wheeze" last used 7am  day of ov Rec Only use your albuterol as a rescue medication   Also Ok to try albuterol 15 min before an activity (on alternating days)  that you know would usually make you short of breath Stop lisinopril 5 mg  and replace with valsartan 80 mg one daily  I will check your scan for your breathing and let you know if I have any additional recs  Continue pantoprazole  Take 30-60 min before first meal of the day GERD diet  Please schedule a follow up office visit in 6 weeks, call sooner if needed - bring inhalers with you but don't take that am    01/03/2022  f/u ov/Randall Ali/ [facility] re: doe/cough ? Acei case   maint on ppi ac qd  Chief  Complaint  Patient presents with   Follow-up    Breathing is much improved.  He ran out of albuterol inhaler 3-4 wks ago-had been using it 2 x daily before he ran out. He feels tired in the morning when he gets up.    Dyspnea:  improved, doing ex bike just fine, not limited by back but by back and knees Cough: still some sensation of globus but much better, daytime only, not using ppi ac  - taking clariton daily but denies itching / sneezing  Sleeping: needs cpap renewal by [facility] SABA use: none and feels if anything better  02: none  Covid status:   first 2 vax   No obvious day to day or daytime variability or assoc excess/ purulent sputum or mucus plugs or hemoptysis or cp or chest tightness, subjective wheeze or overt sinus or hb symptoms.   Sleeping   without nocturnal  or early am exacerbation  of respiratory  c/o's or need for noct saba. Also denies any obvious fluctuation of symptoms with weather or environmental changes or other aggravating or alleviating factors except as outlined above   No unusual exposure hx or h/o childhood pna/ asthma or knowledge of premature birth.  Current Allergies, Complete Past Medical History, Past Surgical History, Family History, and Social History were reviewed in [employer] record.  ROS  The following are not active complaints unless bolded Hoarseness, sore throat, dysphagia, dental problems, itching, sneezing,  nasal congestion or discharge of excess mucus or purulent secretions, ear ache,   fever, chills, sweats, unintended wt loss or wt gain, classically pleuritic or exertional cp,  orthopnea pnd or arm/hand swelling  or leg swelling, presyncope, palpitations, abdominal pain, anorexia, nausea, vomiting, diarrhea  or change in bowel habits or change in bladder habits, change in stools or change in urine, dysuria, hematuria,  rash, arthralgias, visual complaints, headache, numbness, weakness or ataxia or problems with walking or coordination,  change in mood or  memory.        Current Meds  Medication Sig   acetaminophen (TYLENOL) 325 MG tablet Take 650 mg by mouth as needed for moderate pain or mild pain.   albuterol (VENTOLIN HFA) 108 (90 Base) MCG/ACT inhaler INHALE 2 PUFFS INTO THE LUNGS EVERY 6 HOURS AS NEEDED FOR WHEEZING OR SHORTNESS OF BREATH   Ascorbic Acid (VITAMIN C) 1000 MG tablet Take 1,000 mg by mouth daily.   aspirin 81 MG EC tablet Take 81 mg by mouth daily.     Calcium-Magnesium-Vitamin D (CALCIUM 1200+D3 PO) Take 1 tablet by mouth daily.   Carboxymethylcellul-Glycerin (LUBRICATING EYE DROPS OP) Place 1 drop into both eyes daily as needed (irritation).   Cholecalciferol (VITAMIN D3) 50 MCG (2000 UT) TABS Take 2,000 Units by mouth daily.   Cyanocobalamin (B-12) 5000  MCG CAPS Take 5,000 mcg by mouth daily.   docusate sodium (COLACE) 100 MG capsule Take 200 mg by mouth at bedtime.   fluticasone (FLONASE) 50 MCG/ACT nasal spray Place into both nostrils.   lidocaine-prilocaine (EMLA) cream Apply 30 -45 mins prior to port access.   loratadine (CLARITIN) 10 MG tablet Take 10 mg by mouth daily.   Melatonin 10 MG CAPS Take 10 mg by mouth at bedtime as needed (sleep).   nitroGLYCERIN (NITROSTAT) 0.4 MG SL tablet Place 1 tablet (0.4 mg total) under the tongue every 5 (five) minutes as needed for up to 25 doses for chest pain.   pantoprazole (PROTONIX) 40 MG tablet TAKE 1 TABLET  BY MOUTH DAILY   prochlorperazine (COMPAZINE) 10 MG tablet Take 1 tablet (10 mg total) by mouth every 6 (six) hours as needed for nausea or vomiting.   simvastatin (ZOCOR) 40 MG tablet Take 1 tablet (40 mg total) by mouth at bedtime.   traMADol (ULTRAM) 50 MG tablet TAKE 1 TABLET BY MOUTH 3 TIMES DAILY AS NEEDED FOR MODERATE PAIN   valsartan (DIOVAN) 80 MG tablet One daily   Vitamin E 450 MG (1000 UT) CAPS Take 450 Units by mouth daily.   Zinc 50 MG TABS Take 50 mg by mouth daily.           Past Medical History:  Diagnosis Date   Allergy    seasonal   Arthritis    all over- in general    CAD (coronary artery disease)    a. inferior wall MI 10/01 s/p PCI/DES to RCA; b. Myoview 3/16 neg for ischemia; c. LHC 8/16: ostLAD 80%, OM1 70%, OM2 70% x 2 lesions, mRCA 30%, dRCA 70% s/p 4-V CABG 01/24/15 (LIMA-LAD, VG- OM1, VG-OM2, VG-PDA)    Cancer (HCC)    skin, melanoma   Carotid artery disease ([place])    a. [place] 8/16: 1-39% bilateral ICA stenosis   Cataract    removed   Diastolic dysfunction    a. 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   stent RCA   Lung cancer ([place])    Postoperative wound infection 02/02/2015   Reflux esophagitis    Sleep apnea 2017   CPAP at night        Objective:     Wt Readings from Last 3 Encounters:  01/03/22 214 lb (97.1 kg)  01/02/22 216 lb 6.4 oz (98.2 kg)  12/13/21 218 lb (98.9 kg)      Vital signs reviewed  01/03/2022  - Note at rest 02 sats  95% on RA   General appearance:    amb wm easily confused with details of care   HEENT : Oropharynx  clear     Nasal turbinates nl    NECK :  without  apparent JVD/ palpable Nodes/TM    LUNGS: no acc muscle use,  Nl contour chest which is clear to A and P bilaterally without cough on insp or exp maneuvers   CV:  RRR  no s3 or murmur or increase in P2, and no edema   ABD:  obese soft and nontender with nl inspiratory excursion in the supine position. No bruits or organomegaly appreciated   MS:  Nl gait/ ext warm without deformities Or obvious joint restrictions  calf tenderness, cyanosis or clubbing    SKIN: warm and dry without lesions    NEURO:  alert, approp, nl sensorium with  no motor or cerebellar deficits apparent.         I personally reviewed images and agree with radiology impression as follows:   Chest CT 11/01/21 1. Near complete interval resolution of previously described focal nodular area of consolidation within the superior segment left lower lobe. 2. Additional nodular area of consolidation superior segment left lower lobe is slightly decreased in size when compared to prior exam. 3. Interval development of patchy ground-glass opacities within the lingula and  left upper lobe which may be infectious/inflammatory in etiology. 4. There is persistent marked circumferential wall thickening of the ascending and proximal transverse colon most focused about the hepatic flexure. While this may represent an infectious/inflammatory process, additional neoplastic etiologies are not excluded. Recommend  clinical correlation as well as direct visualization with colonoscopy as clinically indicated given the persistent nature of this finding. 5. Similar right suprarenal nodule.    I personally reviewed images and agree with radiology impression as follows:  CXR:   pa and lateral 12/11/21 No evidence of acute cardiopulmonary disease.     Assessment

## 2022-01-03 NOTE — Assessment & Plan Note (Signed)
Referred back to Dr Mortimer Fries 01/03/2022 >>>           Each maintenance medication was reviewed in detail including emphasizing most importantly the difference between maintenance and prns and under what circumstances the prns are to be triggered using an action plan format where appropriate.  Total time for H and P, chart review, counseling, reviewing hfa device(s) and generating customized AVS unique to this office visit / same day charting  > 30 min for summary final f/u ov

## 2022-01-03 NOTE — Assessment & Plan Note (Signed)
Stopped smoking 2001 Onset early 2023  Highly variable on ACEi  -PFT 's nl 2016 x for ERV 13% @ wt  210 - 10/31/2021  RA pt  walked average pace and expressed shortness of breath p 350 ft. Stated he needed to stop due to his back and legs hurting with lowest sats 93% - trial off acei 10/31/2021 > better 01/03/2022  - 01/03/2022  After extensive coaching inhaler device,  effectiveness =   90%  hfa > use saba prn  At this point not even sure there's much asthma her so plan is to just use saba prn  Re SABA :  I spent extra time with pt today reviewing appropriate use of albuterol for prn use on exertion with the following points: 1) saba is for relief of sob that does not improve by walking a slower pace or resting but rather if the pt does not improve after trying this first. 2) If the pt is convinced, as many are, that saba helps recover from activity faster then it's easy to tell if this is the case by re-challenging : ie stop, take the inhaler, then p 5 minutes try the exact same activity (intensity of workload) that just caused the symptoms and see if they are substantially diminished or not after saba 3) if there is an activity that reproducibly causes the symptoms, try the saba 15 min before the activity on alternate days   If in fact the saba really does help, then fine to continue to use it prn but advised may need to look closer at the maintenance regimen (which for now is no maint at all)  being used to achieve better control of airways disease with exertion.

## 2022-01-03 NOTE — Patient Instructions (Signed)
No change in your blood pressure medication > follow  up with your PCP  Only use your albuterol as a rescue medication to be used if you can't catch your breath by resting or doing a relaxed purse lip breathing pattern.  - The less you use it, the better it will work when you need it. - Ok to use up to 2 puffs  every 4 hours if you must but call for immediate appointment if use goes up over your usual need - Don't leave home without it !!  (think of it like the spare tire for your car)   Ok to try albuterol 15 min before an activity (on alternating days)  that you know would usually make you short of breath and see if it makes any difference and if makes none then don't take albuterol after activity unless you can't catch your breath as this means it's the resting that helps, not the albuterol.  See Dr Mortimer Fries next available appt for your cpap renewal

## 2022-01-06 ENCOUNTER — Telehealth: Payer: Self-pay | Admitting: *Deleted

## 2022-01-06 ENCOUNTER — Inpatient Hospital Stay: Payer: PPO

## 2022-01-06 VITALS — BP 128/67 | HR 69 | Temp 98.6°F | Resp 16

## 2022-01-06 DIAGNOSIS — Z5112 Encounter for antineoplastic immunotherapy: Secondary | ICD-10-CM | POA: Diagnosis not present

## 2022-01-06 DIAGNOSIS — C3432 Malignant neoplasm of lower lobe, left bronchus or lung: Secondary | ICD-10-CM

## 2022-01-06 DIAGNOSIS — Z95828 Presence of other vascular implants and grafts: Secondary | ICD-10-CM

## 2022-01-06 LAB — COMPREHENSIVE METABOLIC PANEL
ALT: 16 U/L (ref 0–44)
AST: 16 U/L (ref 15–41)
Albumin: 3.8 g/dL (ref 3.5–5.0)
Alkaline Phosphatase: 54 U/L (ref 38–126)
Anion gap: 6 (ref 5–15)
BUN: 18 mg/dL (ref 8–23)
CO2: 25 mmol/L (ref 22–32)
Calcium: 8.9 mg/dL (ref 8.9–10.3)
Chloride: 106 mmol/L (ref 98–111)
Creatinine, Ser: 0.91 mg/dL (ref 0.61–1.24)
GFR, Estimated: >60 mL/min (ref >60)
Glucose, Bld: 108 mg/dL — ABNORMAL HIGH (ref 70–99)
Potassium: 3.9 mmol/L (ref 3.5–5.1)
Sodium: 137 mmol/L (ref 135–145)
Total Bilirubin: 0.3 mg/dL (ref 0.3–1.2)
Total Protein: 6.5 g/dL (ref 6.5–8.1)

## 2022-01-06 LAB — CBC WITH DIFFERENTIAL/PLATELET
Abs Immature Granulocytes: 0.08 10*3/uL — ABNORMAL HIGH (ref 0.00–0.07)
Basophils Absolute: 0.1 10*3/uL (ref 0.0–0.1)
Basophils Relative: 1 %
Eosinophils Absolute: 0.7 10*3/uL — ABNORMAL HIGH (ref 0.0–0.5)
Eosinophils Relative: 9 %
HCT: 41.1 % (ref 39.0–52.0)
Hemoglobin: 14.2 g/dL (ref 13.0–17.0)
Immature Granulocytes: 1 %
Lymphocytes Relative: 19 %
Lymphs Abs: 1.5 10*3/uL (ref 0.7–4.0)
MCH: 31.5 pg (ref 26.0–34.0)
MCHC: 34.5 g/dL (ref 30.0–36.0)
MCV: 91.1 fL (ref 80.0–100.0)
Monocytes Absolute: 0.9 10*3/uL (ref 0.1–1.0)
Monocytes Relative: 12 %
Neutro Abs: 4.6 10*3/uL (ref 1.7–7.7)
Neutrophils Relative %: 58 %
Platelets: 171 10*3/uL (ref 150–400)
RBC: 4.51 MIL/uL (ref 4.22–5.81)
RDW: 12.5 % (ref 11.5–15.5)
WBC: 7.8 10*3/uL (ref 4.0–10.5)
nRBC: 0 % (ref 0.0–0.2)

## 2022-01-06 MED ORDER — DEXAMETHASONE SODIUM PHOSPHATE 10 MG/ML IJ SOLN
4.0000 mg | Freq: Once | INTRAMUSCULAR | Status: AC
  Administered 2022-01-06: 4 mg via INTRAVENOUS
  Filled 2022-01-06: qty 1

## 2022-01-06 MED ORDER — HEPARIN SOD (PORK) LOCK FLUSH 100 UNIT/ML IV SOLN
500.0000 [IU] | Freq: Once | INTRAVENOUS | Status: AC
  Administered 2022-01-06: 500 [IU] via INTRAVENOUS
  Filled 2022-01-06: qty 5

## 2022-01-06 MED ORDER — SODIUM CHLORIDE 0.9% FLUSH
10.0000 mL | Freq: Once | INTRAVENOUS | Status: AC
  Administered 2022-01-06: 10 mL via INTRAVENOUS
  Filled 2022-01-06: qty 10

## 2022-01-06 MED ORDER — SODIUM CHLORIDE 0.9 % IV SOLN
Freq: Once | INTRAVENOUS | Status: AC
  Filled 2022-01-06: qty 250

## 2022-01-06 NOTE — Telephone Encounter (Signed)
Patient scheduled and aware

## 2022-01-06 NOTE — Progress Notes (Signed)
Patient received 4g dex &1 L IVF infusion, no questions/concerns voiced. Patient stable at discharge. AVS given.

## 2022-01-06 NOTE — Telephone Encounter (Signed)
Patient called to request visit today for IVF, he is scheduled for Wednesday but states "I feel so bad, I cant imagine feeling worse". He reports no energy, fatigue. Patient denies nausea, vomiting or diarrhea. He reports his appetite is overall good. Secure sent to [facility] clinic team for advice.

## 2022-01-07 ENCOUNTER — Encounter: Payer: Self-pay | Admitting: Adult Health

## 2022-01-07 ENCOUNTER — Ambulatory Visit: Payer: PPO | Admitting: Adult Health

## 2022-01-07 VITALS — BP 122/70 | HR 83 | Temp 97.8°F | Ht 68.0 in | Wt 217.6 lb

## 2022-01-07 DIAGNOSIS — G4733 Obstructive sleep apnea (adult) (pediatric): Secondary | ICD-10-CM | POA: Diagnosis not present

## 2022-01-07 DIAGNOSIS — M4726 Other spondylosis with radiculopathy, lumbar region: Secondary | ICD-10-CM | POA: Diagnosis not present

## 2022-01-07 HISTORY — PX: COLONOSCOPY: SHX174

## 2022-01-07 NOTE — Patient Instructions (Signed)
Keep up good work.  Continue on BIPAP At bedtime  and with naps.  Work on healthy weight  Do not drive if sleepy  Order for new BIPAP , current settings.  Follow up in 1 year with Dr. Mortimer Fries or Reyansh Kushnir NP

## 2022-01-07 NOTE — Progress Notes (Signed)
@Patient  ID: Randall Lofts., male    DOB: February 21, 1941, 81 y.o.   MRN: [id number]  Chief Complaint  Patient presents with   Follow-up    Referring provider: Ria Bush, MD  HPI: 81 year old male followed for obstructive sleep apnea.  Medical history significant for non-small cell stage IV lung cancer and mild emphysema  TEST/EVENTS :  Sleep study 02/25/2016 AHI 25.4/hr, Spo2 low at 89.6%.   01/07/2022 Follow up : OSA  Patient presents today for a follow-up visit for sleep apnea.  Patient has moderate obstructive sleep apnea on nocturnal BiPAP.  Patient says he wears his BiPAP every single night.  His machine is getting old and is recently had a red light, on it.  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  Patient says that he has decreased snoring and better sleep since being on BiPAP feels that he benefits from BiPAP.  Patient says he wears his BiPAP every night usually about 7 hours.  BiPAP download shows excellent compliance with 93% usage.  Daily average usage at 7 hours.  Patient is on auto BiPAP IPAP max at 20 and EPAP minimum at 10 cm H2O.  Pressure poor at 4.  Patient's AHI is 0.4/hour.    No Known Allergies  Immunization History  Administered Date(s) Administered   Fluad Quad(high Dose 65+) 03/17/2019, 03/23/2020   Influenza Split 02/27/2012, 04/10/2014   Influenza, High Dose Seasonal PF 04/05/2013, 02/08/2016, 06/06/2021   Influenza,inj,Quad PF,6+ Mos 03/22/2015, 02/25/2017, 03/11/2018   PFIZER(Purple Top)SARS-COV-2 Vaccination 06/30/2019, 07/21/2019   Pneumococcal Conjugate-13 04/13/2015   Pneumococcal Polysaccharide-23 02/26/2005, 02/27/2012   Tdap 04/18/2015   Zoster Recombinat (Shingrix) 09/23/2017, 02/12/2018   Zoster, Live 04/06/2011    Past Medical History:  Diagnosis Date   Allergy    seasonal   Arthritis    all over- in general    CAD (coronary artery disease)    a. inferior wall MI 10/01 s/p PCI/DES to RCA; b.  Myoview 3/16 neg for ischemia; c. LHC 8/16: ostLAD 80%, OM1 70%, OM2 70% x 2 lesions, mRCA 30%, dRCA 70% s/p 4-V CABG 01/24/15 (LIMA-LAD, VG- OM1, VG-OM2, VG-PDA)    Cancer (HCC)    skin, melanoma   Carotid artery disease ([place])    a. [place] 8/16: 1-39% bilateral ICA stenosis   Cataract    removed   Diastolic dysfunction    a. 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     albuterol (VENTOLIN HFA) 108 (90 Base) MCG/ACT inhaler  Inhale 2 puffs into the lungs every 6 (six) hours as needed for wheezing or shortness of breath. 18 g 2   Ascorbic Acid (VITAMIN C) 1000 MG tablet Take 1,000 mg by mouth daily.     aspirin 81 MG EC tablet Take 81 mg by mouth daily.       Calcium-Magnesium-Vitamin D (CALCIUM 1200+D3 PO) Take 1 tablet by mouth daily.     Carboxymethylcellul-Glycerin (LUBRICATING EYE DROPS OP) Place 1 drop into both eyes daily as needed (irritation).     Cholecalciferol (VITAMIN D3) 50 MCG (2000 UT) TABS Take 2,000 Units by mouth daily.     Cyanocobalamin (B-12) 5000 MCG CAPS Take 5,000 mcg by mouth daily.     docusate sodium (COLACE) 100 MG capsule Take  200 mg by mouth at bedtime.     fluticasone (FLONASE) 50 MCG/ACT nasal spray Place into both nostrils.     lidocaine-prilocaine (EMLA) cream Apply 30 -45 mins prior to port access. 30 g 3   loratadine (CLARITIN) 10 MG tablet Take 10 mg by mouth daily.     Melatonin 10 MG CAPS Take 10 mg by mouth at bedtime as needed (sleep).     nitroGLYCERIN (NITROSTAT) 0.4 MG SL tablet Place 1 tablet (0.4 mg total) under the tongue every 5 (five) minutes as needed for up to 25 doses for chest pain. 25 tablet 1   pantoprazole (PROTONIX) 40 MG tablet TAKE 1 TABLET BY MOUTH DAILY 90 tablet 0   prochlorperazine (COMPAZINE) 10 MG tablet Take 1 tablet (10 mg total) by mouth every 6 (six) hours as needed for nausea or vomiting. 40 tablet 1   simvastatin (ZOCOR) 40 MG tablet Take 1 tablet (40 mg total) by mouth at bedtime. 90 tablet 3   traMADol (ULTRAM) 50 MG tablet TAKE 1 TABLET BY MOUTH 3 TIMES DAILY AS NEEDED FOR MODERATE PAIN 90 tablet 0   valsartan (DIOVAN) 80 MG tablet One daily 30 tablet 11   Vitamin E 450 MG (1000 UT) CAPS Take 450 Units by mouth daily.     Zinc 50 MG TABS Take 50 mg by mouth daily.     Facility-Administered Medications Prior to Visit  Medication Dose Route Frequency Provider Last Rate Last Admin   heparin lock flush 100 UNIT/ML injection              Review of Systems:   Constitutional:   No  weight loss, night sweats,  Fevers, chills,  +fatigue, or  lassitude.  HEENT:   No headaches,  Difficulty swallowing,  Tooth/dental problems, or  Sore throat,                No sneezing, itching, ear ache, +nasal congestion, post nasal drip,   CV:  No chest pain,  Orthopnea, PND, swelling in lower extremities, anasarca, dizziness, palpitations, syncope.   GI  No heartburn, indigestion, abdominal pain, nausea, vomiting, diarrhea, change in bowel habits, loss of appetite, bloody stools.   Resp: No excess mucus, no productive cough,  No non-productive cough,  No coughing up of blood.  No change  in color of mucus.  No wheezing.  No chest wall deformity  Skin: no rash or lesions.  GU: no dysuria, change in color of urine, no urgency or frequency.  No flank pain, no hematuria   MS:  No joint pain or swelling.  No decreased range of motion.  No back pain.    Physical Exam  BP 122/70 (BP Location: Left Arm, Cuff Size:  Normal)   Pulse 83   Temp 97.8 F (36.6 C) (Temporal)   Ht 5\' 8"  (1.727 m)   Wt 217 lb 9.6 oz (98.7 kg)   SpO2 95%   BMI 33.09 kg/m   GEN: A/Ox3; pleasant , NAD, well nourished    HEENT:  [place]/AT,  NOSE-clear, THROAT-clear, no lesions, no postnasal drip or exudate noted.  Class 2-3 MP airway   NECK:  Supple w/ fair ROM; no JVD; normal carotid impulses w/o bruits; no thyromegaly or nodules palpated; no lymphadenopathy.    RESP  Clear  P & A; w/o, wheezes/ rales/ or rhonchi. no accessory muscle use, no dullness to percussion  CARD:  RRR, no m/r/g, no peripheral edema, pulses intact, no cyanosis or clubbing.  GI:   Soft & nt; nml bowel sounds; no organomegaly or masses detected.   Musco: Warm bil, no deformities or joint swelling noted.   Neuro: alert, no focal deficits noted.    Skin: Warm, no lesions or rashes    Lab Results:    BNP No results found for: "BNP"  ProBNP No results found for: "PROBNP"  Imaging: DG Chest 2 View  Result Date: 12/11/2021 CLINICAL DATA:  cough/shortness of breath EXAM: CHEST - 2 VIEW COMPARISON:  August 30, 2021. FINDINGS: No consolidation. No visible pleural effusions or pneumothorax. Right Port-A-Cath in similar position. CABG and median sternotomy. Cardiomediastinal silhouette is unchanged. IMPRESSION: No evidence of acute cardiopulmonary disease. Electronically Signed   By: Margaretha Sheffield M.D.   On: 12/11/2021 12:00    dexamethasone (DECADRON) injection 4 mg     Date Action Dose Route User   11/14/2021 0855 Given 4 mg Intravenous Sofie Rower A, RN      dexamethasone (DECADRON) injection 4 mg     Date  Action Dose Route User   12/11/2021 0815 Given 4 mg Intravenous Easter, Tiffany S, RN      dexamethasone (DECADRON) injection 4 mg     Date Action Dose Route User   12/26/2021 1351 Given 4 mg Intravenous (Chest) Mayer Camel, RN      dexamethasone (DECADRON) injection 4 mg     Date Action Dose Route User   12/31/2021 1106 Given 10 mg Intravenous Sofie Rower A, RN      dexamethasone (DECADRON) injection 4 mg     Date Action Dose Route User   01/06/2022 1259 Given 4 mg Intravenous Lillia Corporal, RN      heparin lock flush 100 unit/mL     Date Action Dose Route User   11/14/2021 0940 Given 500 Units Intracatheter Sofie Rower A, RN      heparin lock flush 100 unit/mL     Date Action Dose Route User   12/05/2021 1106 Given 500 Units Intracatheter Barber, [place] H, RN      heparin lock flush 100 unit/mL     Date Action Dose Route User   12/11/2021 0924 Given 500 Units Intravenous Egbert Garibaldi, RN      heparin lock flush 100 unit/mL     Date Action Dose Route User   12/26/2021 1450 Given 500 Units Intracatheter Sofie Rower A, RN      heparin lock flush 100 unit/mL     Date Action Dose Route User   12/31/2021 1153 Given 500 Units Intracatheter Mayer Camel, RN      heparin lock flush 100 unit/mL     Date Action Dose Route User   01/02/2022 1125 Given 500 Units Intracatheter Booth,  Doni H, RN      heparin lock flush 100 unit/mL     Date Action Dose Route User   01/06/2022 1355 Given 500 Units Intravenous Jorene Minors, RN      pembrolizumab [facility]) 200 mg in sodium chloride 0.9 % 50 mL chemo infusion     Date Action Dose Route User   12/05/2021 1022 Infusion Verify (none) Intravenous Egbert Garibaldi, RN   12/05/2021 1022 Rate/Dose Change (none) Intravenous Egbert Garibaldi, RN   12/05/2021 1022 New Bag/Given 200 mg Intravenous Egbert Garibaldi, RN      pembrolizumab [facility]) 200 mg in sodium chloride 0.9 % 50 mL chemo infusion      Date Action Dose Route User   01/02/2022 1115 Infusion Verify (none) Intravenous Livia Snellen, RN   01/02/2022 1045 Infusion Verify (none) Intravenous Livia Snellen, RN   01/02/2022 1045 Rate/Dose Change (none) Intravenous Livia Snellen, RN   01/02/2022 1045 New Bag/Given 200 mg Intravenous Phylliss Bob H, RN      0.9 %  sodium chloride infusion     Date Action Dose Route User   11/14/2021 0927 Rate/Dose Change (none) Intravenous Mayer Camel, RN   11/14/2021 0902 Rate/Dose Change (none) Intravenous Mayer Camel, RN   11/14/2021 0901 Rate/Dose Change (none) Intravenous Mayer Camel, RN   11/14/2021 0857 Infusion Verify (none) Intravenous Mayer Camel, RN   11/14/2021 [phone] Rate/Dose Change (none) Intravenous Mayer Camel, RN      0.9 %  sodium chloride infusion     Date Action Dose Route User   12/05/2021 1101 Rate/Dose Change (none) Intravenous Egbert Garibaldi, RN   12/05/2021 4081 New Bag/Given (none) Intravenous Egbert Garibaldi, RN      0.9 %  sodium chloride infusion     Date Action Dose Route User   12/11/2021 0828 Restarted (none) Intravenous Egbert Garibaldi, RN   12/11/2021 4481 Infusion Verify (none) Intravenous Egbert Garibaldi, RN   12/11/2021 8563 New Bag/Given (none) Intravenous Easter, Tiffany S, RN      0.9 %  sodium chloride infusion     Date Action Dose Route User   12/26/2021 1446 Rate/Dose Change (none) Intravenous Mayer Camel, RN   12/26/2021 1446 Rate/Dose Change (none) Intravenous Mayer Camel, RN   12/26/2021 1346 New Bag/Given (none) Intravenous Sofie Rower A, RN      0.9 %  sodium chloride infusion     Date Action Dose Route User   12/31/2021 1149 Rate/Dose Change (none) Intravenous Mayer Camel, RN   12/31/2021 1118 Rate/Dose Change (none) Intravenous Mayer Camel, RN   12/31/2021 1117 Rate/Dose Change (none) Intravenous Mayer Camel, RN   12/31/2021 1110 Infusion Verify (none) Intravenous Mayer Camel, RN   12/31/2021 1108 Rate/Dose Change (none) Intravenous Sofie Rower A, RN      0.9 %  sodium chloride infusion     Date Action Dose Route User   01/02/2022 1125 Rate/Dose Change (none) Intravenous Livia Snellen, RN   01/02/2022 1120 Rate/Dose Change (none) Intravenous Livia Snellen, RN   01/02/2022 1030 New Bag/Given (none) Intravenous Phylliss Bob H, RN      0.9 %  sodium chloride infusion     Date Action Dose Route User   01/06/2022 1353 Rate/Dose Change (none) Intravenous Jorene Minors, RN   01/06/2022 1251 New Bag/Given (none) Intravenous Lillia Corporal, RN      sodium chloride flush (NS)  0.9 % injection 10 mL     Date Action Dose Route User   11/14/2021 0818 Given 10 mL Intracatheter Mayer Camel, RN      sodium chloride flush (NS) 0.9 % injection 10 mL     Date Action Dose Route User   12/26/2021 1343 Given 10 mL Intracatheter (Chest) Mayer Camel, RN      sodium chloride flush (NS) 0.9 % injection 10 mL     Date Action Dose Route User   12/31/2021 1039 Given 10 mL Intracatheter Sofie Rower A, RN      sodium chloride flush (NS) 0.9 % injection 10 mL     Date Action Dose Route User   01/02/2022 0834 Given 10 mL Intravenous Johnsie Cancel, RN      sodium chloride flush (NS) 0.9 % injection 10 mL     Date Action Dose Route User   01/06/2022 1242 Given 10 mL Intravenous Jorene Minors, RN          Latest Ref Rng & Units 01/22/2015   12:12 PM  PFT Results  FVC-Pre L 4.28   FVC-Predicted Pre % 107   FVC-Post L 4.37   FVC-Predicted Post % 109   Pre FEV1/FVC % % 74   Post FEV1/FCV % % 77   FEV1-Pre L 3.16   FEV1-Predicted Pre % 109   FEV1-Post L 3.36   DLCO uncorrected ml/min/mmHg 23.10   DLCO UNC% % 77   DLVA Predicted % 74   TLC L 8.32   TLC % Predicted % 125   RV % Predicted % 142     No results found for: "NITRICOXIDE"      Assessment & Plan:   OSA (obstructive sleep apnea) Moderate OSA  with excellent control on BIPAP  Patient has excellent control compliance on nocturnal BiPAP.  Has notable benefits on BiPAP.  Needs a new BiPAP machine.  Order has been placed will continue current settings.  Patient is on auto BiPAP maximum IPAP at 20 and minimum EPAP at 10.  Pressure support of 4. Patient education on sleep apnea and BiPAP care.  Plan  Patient Instructions  Keep up good work.  Continue on BIPAP At bedtime  and with naps.  Work on healthy weight  Do not drive if sleepy  Order for new BIPAP , current settings.  Follow up in 1 year with Dr. Mortimer Fries or Daelin Haste NP        Rexene Edison, NP 01/07/2022

## 2022-01-07 NOTE — Assessment & Plan Note (Signed)
Moderate OSA with excellent control on BIPAP  Patient has excellent control compliance on nocturnal BiPAP.  Has notable benefits on BiPAP.  Needs a new BiPAP machine.  Order has been placed will continue current settings.  Patient is on auto BiPAP maximum IPAP at 20 and minimum EPAP at 10.  Pressure support of 4. Patient education on sleep apnea and BiPAP care.  Plan  Patient Instructions  Keep up good work.  Continue on BIPAP At bedtime  and with naps.  Work on healthy weight  Do not drive if sleepy  Order for new BIPAP , current settings.  Follow up in 1 year with Dr. Mortimer Fries or Rigby Swamy NP

## 2022-01-08 ENCOUNTER — Inpatient Hospital Stay: Payer: PPO | Attending: Internal Medicine

## 2022-01-08 ENCOUNTER — Inpatient Hospital Stay: Payer: PPO

## 2022-01-08 ENCOUNTER — Ambulatory Visit: Payer: PPO

## 2022-01-08 ENCOUNTER — Other Ambulatory Visit: Payer: PPO

## 2022-01-08 ENCOUNTER — Telehealth: Payer: Self-pay | Admitting: *Deleted

## 2022-01-08 VITALS — BP 150/79 | HR 66 | Temp 98.8°F | Resp 18

## 2022-01-08 DIAGNOSIS — J329 Chronic sinusitis, unspecified: Secondary | ICD-10-CM | POA: Insufficient documentation

## 2022-01-08 DIAGNOSIS — Z7982 Long term (current) use of aspirin: Secondary | ICD-10-CM | POA: Diagnosis not present

## 2022-01-08 DIAGNOSIS — Z8774 Personal history of (corrected) congenital malformations of heart and circulatory system: Secondary | ICD-10-CM | POA: Diagnosis not present

## 2022-01-08 DIAGNOSIS — Z955 Presence of coronary angioplasty implant and graft: Secondary | ICD-10-CM | POA: Diagnosis not present

## 2022-01-08 DIAGNOSIS — R42 Dizziness and giddiness: Secondary | ICD-10-CM | POA: Diagnosis not present

## 2022-01-08 DIAGNOSIS — Z79899 Other long term (current) drug therapy: Secondary | ICD-10-CM | POA: Insufficient documentation

## 2022-01-08 DIAGNOSIS — Z7952 Long term (current) use of systemic steroids: Secondary | ICD-10-CM | POA: Insufficient documentation

## 2022-01-08 DIAGNOSIS — R0602 Shortness of breath: Secondary | ICD-10-CM | POA: Diagnosis not present

## 2022-01-08 DIAGNOSIS — I2510 Atherosclerotic heart disease of native coronary artery without angina pectoris: Secondary | ICD-10-CM | POA: Insufficient documentation

## 2022-01-08 DIAGNOSIS — C3432 Malignant neoplasm of lower lobe, left bronchus or lung: Secondary | ICD-10-CM

## 2022-01-08 DIAGNOSIS — Z87891 Personal history of nicotine dependence: Secondary | ICD-10-CM | POA: Diagnosis not present

## 2022-01-08 DIAGNOSIS — Z7902 Long term (current) use of antithrombotics/antiplatelets: Secondary | ICD-10-CM | POA: Insufficient documentation

## 2022-01-08 MED ORDER — SODIUM CHLORIDE 0.9 % IV SOLN
Freq: Once | INTRAVENOUS | Status: AC
  Filled 2022-01-08: qty 250

## 2022-01-08 MED ORDER — DEXAMETHASONE SODIUM PHOSPHATE 10 MG/ML IJ SOLN
4.0000 mg | Freq: Once | INTRAMUSCULAR | Status: AC
  Administered 2022-01-08: 4 mg via INTRAVENOUS

## 2022-01-08 MED ORDER — SODIUM CHLORIDE 0.9 % IV SOLN: Freq: Once | INTRAVENOUS | Status: DC

## 2022-01-08 MED ORDER — HEPARIN SOD (PORK) LOCK FLUSH 100 UNIT/ML IV SOLN
500.0000 [IU] | Freq: Once | INTRAVENOUS | Status: AC | PRN
  Administered 2022-01-08: 500 [IU]
  Filled 2022-01-08: qty 5

## 2022-01-08 MED ORDER — SODIUM CHLORIDE 0.9% FLUSH
10.0000 mL | Freq: Once | INTRAVENOUS | Status: AC | PRN
  Administered 2022-01-08: 10 mL
  Filled 2022-01-08: qty 10

## 2022-01-08 MED ORDER — ONDANSETRON HCL 4 MG/2ML IJ SOLN: 8.0000 mg | Freq: Once | INTRAMUSCULAR | Status: DC

## 2022-01-08 NOTE — Progress Notes (Signed)
Pt added on for IVF's after he called stating he feels weak and wants to come in for Fluids. He reported feeling great yesterday. Woke up having no energy, felt dizzy. He reports this dizziness comes and goes for a long time now. He worked out in the [employer] this morning. Denies dyspnea, nausea. Eating and drinking well.

## 2022-01-08 NOTE — Telephone Encounter (Signed)
Patient called reporting that he is not feeling well again and is feeling dizzy since las night. Cannot express how he feels bad "I just feel bad" States his last treatment was Thursday and that he had IV fluids and steroids Monday. Please advise

## 2022-01-08 NOTE — Telephone Encounter (Signed)
1200-spoke with patient. Apt made for iv fluids at 1pm today at [facility]

## 2022-01-10 ENCOUNTER — Ambulatory Visit (INDEPENDENT_AMBULATORY_CARE_PROVIDER_SITE_OTHER): Payer: PPO | Admitting: Family Medicine

## 2022-01-10 ENCOUNTER — Encounter: Payer: Self-pay | Admitting: Family Medicine

## 2022-01-10 VITALS — BP 120/60 | HR 69 | Temp 97.8°F | Ht 68.0 in | Wt 217.0 lb

## 2022-01-10 DIAGNOSIS — R0981 Nasal congestion: Secondary | ICD-10-CM

## 2022-01-10 DIAGNOSIS — G4733 Obstructive sleep apnea (adult) (pediatric): Secondary | ICD-10-CM

## 2022-01-10 DIAGNOSIS — R3914 Feeling of incomplete bladder emptying: Secondary | ICD-10-CM | POA: Diagnosis not present

## 2022-01-10 DIAGNOSIS — C3432 Malignant neoplasm of lower lobe, left bronchus or lung: Secondary | ICD-10-CM

## 2022-01-10 DIAGNOSIS — N401 Enlarged prostate with lower urinary tract symptoms: Secondary | ICD-10-CM

## 2022-01-10 DIAGNOSIS — M4726 Other spondylosis with radiculopathy, lumbar region: Secondary | ICD-10-CM | POA: Diagnosis not present

## 2022-01-10 MED ORDER — FINASTERIDE 5 MG PO TABS: 5.0000 mg | ORAL_TABLET | Freq: Every day | ORAL | 6 refills | Status: DC

## 2022-01-10 MED ORDER — AMOXICILLIN-POT CLAVULANATE 875-125 MG PO TABS: 1.0000 | ORAL_TABLET | Freq: Two times a day (BID) | ORAL | 0 refills | Status: AC

## 2022-01-10 NOTE — Assessment & Plan Note (Signed)
Ongoing symptoms, previous IPSS severe done 2 months ago. Flomax trial again because orthostatic dizziness so he stopped this. Interested in further treatment so we will start finasteride 5 mg daily-discussed this is a slow acting medication that may take months to take effect.  He will let us know how he does with this new regimen.

## 2022-01-10 NOTE — Progress Notes (Signed)
Patient ID: Randall Ali., male    DOB: 1941-04-12, 81 y.o.   MRN: [id number]  This visit was conducted in person.  BP 120/60   Pulse 69   Temp 97.8 F (36.6 C) (Temporal)   Ht 5\' 8"  (1.727 m)   Wt 217 lb (98.4 kg)   SpO2 93%   BMI 32.99 kg/m    CC: sinus congestion  Subjective:   HPI: Randall Ali. is a 81 y.o. male presenting on 01/10/2022 for Sinus Problem (C/o sinus congestion, HA, dizziness and cough.  Sxs started 01/08/22. Tried Claritin. )   Seen last month with sinus symptoms in setting of stage IV lung cancer receiving maintenance treatment with Keytruda, treated for bacterial sinusitis with Augmentin 10-day course - did feel well after completing antibiotics.   Seen yesterday at cancer center status posttreatment with 1 L of normal saline via IV fluids as well as 4 mg of dexamethasone.   Now over the last 2 days started feeling worse - frontal sinus headache, nasal congestion. Mild cough. Some PNdrainage. Not much mucous production.This morning has had some dizzy spells described as lightheaded - no vertigo or presyncope.  This week he restarted flonase, continues claritin. No nose bleeds.  Straining eyes worsens headache. Singulair was previously not helpful.   No fevers/chills, ST, chest congestion, wheezing, ear or tooth pain.   Saw pulm yesterday for OSA - rec new BIPAP machine given excellent compliance and control on current.   Notes ongoing urinary issues - incomplete emptying with hesitancy and weak stream but no dribbling, dysuria, hematuria. Mild nocturia. Flomax causes orthostatic dizziness.  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       Relevant past medical, surgical, family and social history reviewed and updated as indicated. Interim medical history since our last visit  reviewed. Allergies and medications reviewed and updated. Outpatient Medications Prior to Visit  Medication Sig Dispense Refill   acetaminophen (TYLENOL) 325 MG tablet Take 650 mg by mouth as needed for moderate pain or mild pain.     albuterol (VENTOLIN HFA) 108 (90 Base) MCG/ACT inhaler Inhale 2 puffs into the lungs every 6 (six) hours as needed for wheezing or shortness of breath. 18 g 2   Ascorbic Acid (VITAMIN C) 1000 MG tablet Take 1,000 mg by mouth daily.     aspirin 81 MG EC tablet Take 81 mg by mouth daily.       Calcium-Magnesium-Vitamin D (CALCIUM 1200+D3 PO) Take 1 tablet by mouth daily.     Carboxymethylcellul-Glycerin (LUBRICATING EYE DROPS OP) Place 1 drop into both eyes daily as needed (irritation).     Cholecalciferol (VITAMIN D3) 50 MCG (2000 UT) TABS Take 2,000 Units by mouth daily.     Cyanocobalamin (B-12) 5000 MCG CAPS Take 5,000 mcg by mouth daily.     docusate sodium (COLACE) 100 MG capsule Take 200 mg by mouth at bedtime.     fluticasone (FLONASE) 50 MCG/ACT nasal spray Place into both nostrils.     lidocaine-prilocaine (EMLA) cream Apply 30 -45 mins prior to port access. 30 g 3   loratadine (CLARITIN) 10 MG tablet Take 10 mg by mouth daily.     Melatonin 10 MG CAPS Take 10 mg by mouth at bedtime as needed (sleep).  nitroGLYCERIN (NITROSTAT) 0.4 MG SL tablet Place 1 tablet (0.4 mg total) under the tongue every 5 (five) minutes as needed for up to 25 doses for chest pain. 25 tablet 1   pantoprazole (PROTONIX) 40 MG tablet TAKE 1 TABLET BY MOUTH DAILY 90 tablet 0   prochlorperazine (COMPAZINE) 10 MG tablet Take 1 tablet (10 mg total) by mouth every 6 (six) hours as needed for nausea or vomiting. 40 tablet 1   simvastatin (ZOCOR) 40 MG tablet Take 1 tablet (40 mg total) by mouth at bedtime. 90 tablet 3   traMADol (ULTRAM) 50 MG tablet TAKE 1 TABLET BY MOUTH 3 TIMES DAILY AS NEEDED FOR MODERATE PAIN 90 tablet 0   valsartan (DIOVAN) 80 MG tablet One daily 30 tablet 11    Vitamin E 450 MG (1000 UT) CAPS Take 450 Units by mouth daily.     Zinc 50 MG TABS Take 50 mg by mouth daily.     Facility-Administered Medications Prior to Visit  Medication Dose Route Frequency Provider Last Rate Last Admin   heparin lock flush 100 UNIT/ML injection              Per HPI unless specifically indicated in ROS section below Review of Systems  Objective:  BP 120/60   Pulse 69   Temp 97.8 F (36.6 C) (Temporal)   Ht 5\' 8"  (1.727 m)   Wt 217 lb (98.4 kg)   SpO2 93%   BMI 32.99 kg/m   Wt Readings from Last 3 Encounters:  01/10/22 217 lb (98.4 kg)  01/07/22 217 lb 9.6 oz (98.7 kg)  01/03/22 214 lb (97.1 kg)      Physical Exam Vitals and nursing note reviewed.  Constitutional:      Appearance: Normal appearance. He is not ill-appearing.  HENT:     Head: Normocephalic and atraumatic.     Right Ear: Hearing, tympanic membrane, ear canal and external ear normal. There is no impacted cerumen.     Left Ear: Hearing, tympanic membrane, ear canal and external ear normal. There is no impacted cerumen.     Nose: Congestion present. No mucosal edema or rhinorrhea.     Right Turbinates: Swollen and pale. Not enlarged.     Left Turbinates: Swollen and pale. Not enlarged.     Right Sinus: No maxillary sinus tenderness or frontal sinus tenderness.     Left Sinus: No maxillary sinus tenderness or frontal sinus tenderness.     Mouth/Throat:     Mouth: Mucous membranes are moist.     Pharynx: Oropharynx is clear. No oropharyngeal exudate or posterior oropharyngeal erythema.  Eyes:     Extraocular Movements: Extraocular movements intact.     Conjunctiva/sclera: Conjunctivae normal.     Pupils: Pupils are equal, round, and reactive to light.  Cardiovascular:     Rate and Rhythm: Normal rate and regular rhythm.     Pulses: Normal pulses.     Heart sounds: Normal heart sounds. No murmur heard. Pulmonary:     Effort: Pulmonary effort is normal. No respiratory distress.      Breath sounds: Normal breath sounds. No wheezing, rhonchi or rales.  Musculoskeletal:     Cervical back: Normal range of motion and neck supple. No rigidity.     Right lower leg: No edema.     Left lower leg: No edema.  Lymphadenopathy:     Cervical: No cervical adenopathy.  Skin:    General: Skin is warm and dry.  Findings: No rash.  Neurological:     Mental Status: He is alert.  Psychiatric:        Mood and Affect: Mood normal.        Behavior: Behavior normal.       Results for orders placed or performed in visit on 01/06/22  CBC with Differential/Platelet  Result Value Ref Range   WBC 7.8 4.0 - 10.5 K/uL   RBC 4.51 4.22 - 5.81 MIL/uL   Hemoglobin 14.2 13.0 - 17.0 g/dL   HCT 41.1 39.0 - 52.0 %   MCV 91.1 80.0 - 100.0 fL   MCH 31.5 26.0 - 34.0 pg   MCHC 34.5 30.0 - 36.0 g/dL   RDW 12.5 11.5 - 15.5 %   Platelets 171 150 - 400 K/uL   nRBC 0.0 0.0 - 0.2 %   Neutrophils Relative % 58 %   Neutro Abs 4.6 1.7 - 7.7 K/uL   Lymphocytes Relative 19 %   Lymphs Abs 1.5 0.7 - 4.0 K/uL   Monocytes Relative 12 %   Monocytes Absolute 0.9 0.1 - 1.0 K/uL   Eosinophils Relative 9 %   Eosinophils Absolute 0.7 (H) 0.0 - 0.5 K/uL   Basophils Relative 1 %   Basophils Absolute 0.1 0.0 - 0.1 K/uL   Immature Granulocytes 1 %   Abs Immature Granulocytes 0.08 (H) 0.00 - 0.07 K/uL  Comprehensive metabolic panel  Result Value Ref Range   Sodium 137 135 - 145 mmol/L   Potassium 3.9 3.5 - 5.1 mmol/L   Chloride 106 98 - 111 mmol/L   CO2 25 22 - 32 mmol/L   Glucose, Bld 108 (H) 70 - 99 mg/dL   BUN 18 8 - 23 mg/dL   Creatinine, Ser 0.91 0.61 - 1.24 mg/dL   Calcium 8.9 8.9 - 10.3 mg/dL   Total Protein 6.5 6.5 - 8.1 g/dL   Albumin 3.8 3.5 - 5.0 g/dL   AST 16 15 - 41 U/L   ALT 16 0 - 44 U/L   Alkaline Phosphatase 54 38 - 126 U/L   Total Bilirubin 0.3 0.3 - 1.2 mg/dL   GFR, Estimated >60 >60 mL/min   Anion gap 6 5 - 15   *Note: Due to a large number of results and/or encounters for the  requested time period, some results have not been displayed. A complete set of results can be found in Results Review.    Assessment & Plan:   Problem List Items Addressed This Visit     OSA (obstructive sleep apnea)    Just saw Tammy Parrett of pulmonology for sleep apnea with plan to order new BiPAP machine.  He is doing well with his treatment.  Appreciate pulmonology care.      Nasal sinus congestion - Primary    History today more consistent with allergic sinus congestion. This is ongoing despite recently restarting Flonase, continues Claritin daily for the last several months. We discussed nasal saline irrigation-he has also started this recently. Previous poor response to Singulair. Recommend give symptoms a few more days, wait-and-see prescription printed out for Augmentin 10 mg course with instructions when to fill.      BPH (benign prostatic hyperplasia)    Ongoing symptoms, previous IPSS severe done 2 months ago. Flomax trial again because orthostatic dizziness so he stopped this. Interested in further treatment so we will start finasteride 5 mg daily-discussed this is a slow acting medication that may take months to take effect.  He will let us  know how he does with this new regimen.      Relevant Medications   finasteride (PROSCAR) 5 MG tablet   Cancer of lower lobe of left lung [facility])    Appreciate oncology care, continues maintenance Keytruda.  He has also recently received IV fluids through oncology clinic.      Relevant Medications   amoxicillin-clavulanate (AUGMENTIN) 875-125 MG tablet     Meds ordered this encounter  Medications   finasteride (PROSCAR) 5 MG tablet    Sig: Take 1 tablet (5 mg total) by mouth daily.    Dispense:  30 tablet    Refill:  6   amoxicillin-clavulanate (AUGMENTIN) 875-125 MG tablet    Sig: Take 1 tablet by mouth 2 (two) times daily for 10 days.    Dispense:  20 tablet    Refill:  0   No orders of the defined types were placed in  this encounter.    Patient Instructions  Continue flonase and claritin.  Continue nasal saline irrigation.  Try these steps for another few days. If no better, fill antibiotic prescription printed out today.    For urinary symptoms - try finasteride (proscar) which over time may shrink the size of the prostate. Let us know how you do with this.   Follow up plan: Return if symptoms worsen or fail to improve.  Ria Bush, MD

## 2022-01-10 NOTE — Assessment & Plan Note (Signed)
Just saw Randall Ali of pulmonology for sleep apnea with plan to order new BiPAP machine.  He is doing well with his treatment.  Appreciate pulmonology care.

## 2022-01-10 NOTE — Assessment & Plan Note (Signed)
Appreciate oncology care, continues maintenance Keytruda.  He has also recently received IV fluids through oncology clinic.

## 2022-01-10 NOTE — Patient Instructions (Addendum)
Continue flonase and claritin.  Continue nasal saline irrigation.  Try these steps for another few days. If no better, fill antibiotic prescription printed out today.    For urinary symptoms - try finasteride (proscar) which over time may shrink the size of the prostate. Let us know how you do with this.

## 2022-01-10 NOTE — Assessment & Plan Note (Signed)
History today more consistent with allergic sinus congestion. This is ongoing despite recently restarting Flonase, continues Claritin daily for the last several months. We discussed nasal saline irrigation-he has also started this recently. Previous poor response to Singulair. Recommend give symptoms a few more days, wait-and-see prescription printed out for Augmentin 10 mg course with instructions when to fill.

## 2022-01-13 ENCOUNTER — Other Ambulatory Visit: Payer: Self-pay | Admitting: Family Medicine

## 2022-01-13 DIAGNOSIS — S99921A Unspecified injury of right foot, initial encounter: Secondary | ICD-10-CM | POA: Diagnosis not present

## 2022-01-13 DIAGNOSIS — M4726 Other spondylosis with radiculopathy, lumbar region: Secondary | ICD-10-CM | POA: Diagnosis not present

## 2022-01-13 NOTE — Telephone Encounter (Signed)
ERx 

## 2022-01-13 NOTE — Telephone Encounter (Signed)
Name of Medication: Tramadol Name of Pharmacy: [facility] or Written Date and Quantity: 12/14/21, #90 Last Office Visit and Type: 01/10/22, sinus sxs Next Office Visit and Type: 04/08/22, CPE Last Controlled Substance Agreement Date: 01/18/16 Last UDS: 01/18/16

## 2022-01-14 ENCOUNTER — Inpatient Hospital Stay: Payer: PPO

## 2022-01-14 VITALS — BP 135/70 | HR 80 | Resp 17

## 2022-01-14 DIAGNOSIS — C3432 Malignant neoplasm of lower lobe, left bronchus or lung: Secondary | ICD-10-CM | POA: Diagnosis not present

## 2022-01-14 LAB — BASIC METABOLIC PANEL
Anion gap: 6 (ref 5–15)
BUN: 24 mg/dL — ABNORMAL HIGH (ref 8–23)
CO2: 24 mmol/L (ref 22–32)
Calcium: 9 mg/dL (ref 8.9–10.3)
Chloride: 106 mmol/L (ref 98–111)
Creatinine, Ser: 0.97 mg/dL (ref 0.61–1.24)
GFR, Estimated: >60 mL/min (ref >60)
Glucose, Bld: 103 mg/dL — ABNORMAL HIGH (ref 70–99)
Potassium: 4.1 mmol/L (ref 3.5–5.1)
Sodium: 136 mmol/L (ref 135–145)

## 2022-01-14 LAB — CBC WITH DIFFERENTIAL/PLATELET
Abs Immature Granulocytes: 0.11 10*3/uL — ABNORMAL HIGH (ref 0.00–0.07)
Basophils Absolute: 0.1 10*3/uL (ref 0.0–0.1)
Basophils Relative: 1 %
Eosinophils Absolute: 0.7 10*3/uL — ABNORMAL HIGH (ref 0.0–0.5)
Eosinophils Relative: 7 %
HCT: 43.3 % (ref 39.0–52.0)
Hemoglobin: 14.6 g/dL (ref 13.0–17.0)
Immature Granulocytes: 1 %
Lymphocytes Relative: 10 %
Lymphs Abs: 1 10*3/uL (ref 0.7–4.0)
MCH: 31 pg (ref 26.0–34.0)
MCHC: 33.7 g/dL (ref 30.0–36.0)
MCV: 91.9 fL (ref 80.0–100.0)
Monocytes Absolute: 1 10*3/uL (ref 0.1–1.0)
Monocytes Relative: 10 %
Neutro Abs: 7.2 10*3/uL (ref 1.7–7.7)
Neutrophils Relative %: 71 %
Platelets: 176 10*3/uL (ref 150–400)
RBC: 4.71 MIL/uL (ref 4.22–5.81)
RDW: 12.4 % (ref 11.5–15.5)
WBC: 10 10*3/uL (ref 4.0–10.5)
nRBC: 0 % (ref 0.0–0.2)

## 2022-01-14 MED ORDER — SODIUM CHLORIDE 0.9% FLUSH
10.0000 mL | Freq: Once | INTRAVENOUS | Status: AC | PRN
  Administered 2022-01-14: 10 mL
  Filled 2022-01-14: qty 10

## 2022-01-14 MED ORDER — DEXAMETHASONE SODIUM PHOSPHATE 10 MG/ML IJ SOLN
4.0000 mg | Freq: Once | INTRAMUSCULAR | Status: AC
  Administered 2022-01-14: 4 mg via INTRAVENOUS

## 2022-01-14 MED ORDER — HEPARIN SOD (PORK) LOCK FLUSH 100 UNIT/ML IV SOLN
500.0000 [IU] | Freq: Once | INTRAVENOUS | Status: AC | PRN
  Administered 2022-01-14: 500 [IU]
  Filled 2022-01-14: qty 5

## 2022-01-14 MED ORDER — SODIUM CHLORIDE 0.9 % IV SOLN
Freq: Once | INTRAVENOUS | Status: AC
  Filled 2022-01-14: qty 250

## 2022-01-14 NOTE — Progress Notes (Signed)
BUN 24 today. Received IVF's and decadron. VSS. Discharged to home.

## 2022-01-15 ENCOUNTER — Encounter: Payer: Self-pay | Admitting: Internal Medicine

## 2022-01-15 ENCOUNTER — Ambulatory Visit: Payer: PPO | Admitting: Internal Medicine

## 2022-01-15 VITALS — BP 124/72 | HR 76 | Ht 68.0 in | Wt 218.2 lb

## 2022-01-15 DIAGNOSIS — R933 Abnormal findings on diagnostic imaging of other parts of digestive tract: Secondary | ICD-10-CM | POA: Diagnosis not present

## 2022-01-15 DIAGNOSIS — K219 Gastro-esophageal reflux disease without esophagitis: Secondary | ICD-10-CM | POA: Diagnosis not present

## 2022-01-15 MED ORDER — NA SULFATE-K SULFATE-MG SULF 17.5-3.13-1.6 GM/177ML PO SOLN: 1.0000 | Freq: Once | ORAL | 0 refills | Status: AC

## 2022-01-15 NOTE — Patient Instructions (Signed)
You have been scheduled for a colonoscopy. Please follow written instructions given to you at your visit today.  Please pick up your prep supplies at the pharmacy within the next 1-3 days. If you use inhalers (even only as needed), please bring them with you on the day of your procedure.  The [facility] providers would like to encourage you to use [facility] to communicate with providers for non-urgent requests or questions.  Due to long hold times on the telephone, sending your provider a message by [facility] may be a faster and more efficient way to get a response.  Please allow 48 business hours for a response.  Please remember that this is for non-urgent requests.   Due to recent changes in healthcare laws, you may see the results of your imaging and laboratory studies on MyChart before your provider has had a chance to review them.  We understand that in some cases there may be results that are confusing or concerning to you. Not all laboratory results come back in the same time frame and the provider may be waiting for multiple results in order to interpret others.  Please give [place] 48 hours in order for your provider to thoroughly review all the results before contacting the office for clarification of your results.

## 2022-01-15 NOTE — Progress Notes (Signed)
Patient ID: Randall Ali., male   DOB: 1941-03-22, 81 y.o.   MRN: [id number] HPI: Randall Ali is a 81 year old male with a history of non-small cell lung cancer receiving chemotherapy, CAD, sleep apnea, hypertension, hyperlipidemia, history of reflux who is seen to evaluate abnormal imaging of the colon.  He is here alone today.  He is known to me from prior endoscopic evaluations.  He had EGD in February 2016 which showed reflux esophagitis.  No Barrett's esophagus.  Normal stomach and normal duodenum.  Colonoscopy performed in January 2016 was normal to the cecum.  Patient reports that he is feeling well from a GI perspective.  Good appetite.  No change in bowel habits.  No blood in stool or melena.  His stools can get less frequent without high-fiber diet.  He eats peanuts on a regular basis which help him with his bowel movements.  He has gained 20 pounds since lung cancer diagnosis which she attributes somewhat to steroids.  Appetite remains good.  No dysphagia or odynophagia.  No breakthrough heartburn.  He does use pantoprazole once daily.  No nausea or vomiting.  He wears CPAP nightly but not supplemental oxygen.  Past Medical History:  Diagnosis Date   Allergy    seasonal   Arthritis    all over- in general    CAD (coronary artery disease)    a. inferior wall MI 10/01 s/p PCI/DES to RCA; b. Myoview 3/16 neg for ischemia; c. LHC 8/16: ostLAD 80%, OM1 70%, OM2 70% x 2 lesions, mRCA 30%, dRCA 70% s/p 4-V CABG 01/24/15 (LIMA-LAD, VG- OM1, VG-OM2, VG-PDA)    Cancer (HCC)    skin, melanoma   Carotid artery disease ([place])    a. [place] 8/16: 1-39% bilateral ICA stenosis   Cataract    removed   Diastolic dysfunction    a. TTE 8/16: EF 55-60%, no RWMA, Gr1DD, calcified mitral annulus, mild biatrial enlargement   Erectile dysfunction    GERD (gastroesophageal reflux disease)    History of elbow surgery    History of hiatal hernia    HLD (hyperlipidemia)    HTN (hypertension)    Inferior  myocardial infarction ([place]) 03/2000   stent RCA   Lung cancer ([place])    Postoperative wound infection 02/02/2015   Reflux esophagitis    Sleep apnea 2017   CPAP at night    Past Surgical History:  Procedure Laterality Date   arm surgery  2010   BROW LIFT Bilateral 11/25/2019   Procedure: BROW PTOSIS REPAIR BILATERAL;  Surgeon: Karle Starch, MD;  Location: [facility];  Service: Ophthalmology;  Laterality: Bilateral;  sleep apnea   CARDIAC CATHETERIZATION  06/24/2011   CARDIAC CATHETERIZATION N/A 01/18/2015   Procedure: Left Heart Cath with coronary angiography;  Surgeon: Minna Merritts, MD;  Location: [facility];  Service: Cardiovascular;  Laterality: N/A;   CARDIAC CATHETERIZATION N/A 01/18/2015   Procedure: Intravascular Pressure Wire/FFR Study;  Surgeon: [name], MD;  Location: [facility];  Service: Cardiovascular;  Laterality: N/A;   CAROTID STENT  03/10/2011   COLONOSCOPY  2010   COLONOSCOPY  06/14/2014   Dr Hilarie Fredrickson   CORONARY ARTERY BYPASS GRAFT N/A 01/24/2015   Procedure: CORONARY ARTERY BYPASS GRAFTING x 4 (LIMA-LAD, SVG-Int 1- Int 2, SVG-PD) ENDOSCOPIC GREATER SAPHENOUS VEIN HARVEST LEFT LEG;  Surgeon: [name], MD;  Location: [facility];  Service: Open Heart Surgery;  Laterality: N/A;   EMBOLECTOMY  06/15/2019  Procedure: EMBOLECTOMY;  Surgeon: Katha Cabal, MD;  Location: [facility];  Service: Vascular;;  right superficial femoral artery   ENDARTERECTOMY FEMORAL Right 06/15/2019   Procedure: ENDARTERECTOMY FEMORAL;  Surgeon: Katha Cabal, MD;  Location: [facility];  Service: Vascular;  Laterality: Right;  common femoral profunda femoris superficial femoral   ESOPHAGOGASTRODUODENOSCOPY (EGD) WITH PROPOFOL N/A 04/24/2016   Procedure: ESOPHAGOGASTRODUODENOSCOPY (EGD) WITH PROPOFOL;  Surgeon: Jerene Bears, MD;  Location: [facility];  Service: Gastroenterology;  Laterality: N/A;   EYE SURGERY     lasik 15 yrs. ago, cataracts  removed - both eyes    HAMMER TOE SURGERY     right toe   INSERTION OF ILIAC STENT Right 06/15/2019   Procedure: INSERTION OF ILIAC STENT ( STENTING OF SFA/POP ARTERY );  Surgeon: Katha Cabal, MD;  Location: [facility];  Service: Vascular;  Laterality: Right;  angioplpasty and stent placement: right superficial femoral right tibiopopliteal trunk bilateral common iliac arteries   IR IMAGING GUIDED PORT INSERTION  11/09/2020   LEFT HEART CATH AND CORONARY ANGIOGRAPHY Left 06/10/2017   Procedure: LEFT HEART CATH AND CORONARY ANGIOGRAPHY;  Surgeon: Minna Merritts, MD;  Location: [facility];  Service: Cardiovascular;  Laterality: Left;   LOWER EXTREMITY ANGIOGRAPHY Left 01/04/2019   Procedure: LOWER EXTREMITY ANGIOGRAPHY;  Surgeon: Katha Cabal, MD;  Location: [facility];  Service: Cardiovascular;  Laterality: Left;   LOWER EXTREMITY ANGIOGRAPHY Right 01/25/2019   Procedure: LOWER EXTREMITY ANGIOGRAPHY;  Surgeon: Katha Cabal, MD;  Location: [facility];  Service: Cardiovascular;  Laterality: Right;   LOWER EXTREMITY ANGIOGRAPHY Right 01/15/2021   LOWER EXTREMITY ANGIOGRAPHY and stent placement to R SFA and popliteal artery Delana Meyer, Dolores Lory, MD)   NASAL SINUS SURGERY  2008   septpolasty, bilateral turbinate reduction   SHOULDER ARTHROSCOPY  2012   TEE WITHOUT CARDIOVERSION N/A 01/24/2015   Procedure: TRANSESOPHAGEAL ECHOCARDIOGRAM (TEE);  Surgeon: [name], MD;  Location: [facility];  Service: Open Heart Surgery;  Laterality: N/A;   TOE SURGERY  1994   UPPER GI ENDOSCOPY  07/2014, 04-24-16   Dr Raquel James   WRIST SURGERY  2011    Outpatient Medications Prior to Visit  Medication Sig Dispense Refill   acetaminophen (TYLENOL) 325 MG tablet Take 650 mg by mouth as needed for moderate pain or mild pain.     albuterol (VENTOLIN HFA) 108 (90 Base) MCG/ACT inhaler Inhale 2 puffs into the lungs every 6 (six) hours as needed for wheezing or  shortness of breath. 18 g 2   amoxicillin-clavulanate (AUGMENTIN) 875-125 MG tablet Take 1 tablet by mouth 2 (two) times daily for 10 days. 20 tablet 0   Ascorbic Acid (VITAMIN C) 1000 MG tablet Take 1,000 mg by mouth daily.     aspirin 81 MG EC tablet Take 81 mg by mouth daily.       Calcium-Magnesium-Vitamin D (CALCIUM 1200+D3 PO) Take 1 tablet by mouth daily.     Carboxymethylcellul-Glycerin (LUBRICATING EYE DROPS OP) Place 1 drop into both eyes daily as needed (irritation).     Cholecalciferol (VITAMIN D3) 50 MCG (2000 UT) TABS Take 2,000 Units by mouth daily.     Cyanocobalamin (B-12) 5000 MCG CAPS Take 5,000 mcg by mouth daily.     docusate sodium (COLACE) 100 MG capsule Take 200 mg by mouth at bedtime.     finasteride (PROSCAR) 5 MG tablet Take 1 tablet (5 mg total) by mouth daily. 30 tablet 6  fluticasone (FLONASE) 50 MCG/ACT nasal spray Place into both nostrils.     lidocaine-prilocaine (EMLA) cream Apply 30 -45 mins prior to port access. 30 g 3   loratadine (CLARITIN) 10 MG tablet Take 10 mg by mouth daily.     Melatonin 10 MG CAPS Take 10 mg by mouth at bedtime as needed (sleep).     nitroGLYCERIN (NITROSTAT) 0.4 MG SL tablet Place 1 tablet (0.4 mg total) under the tongue every 5 (five) minutes as needed for up to 25 doses for chest pain. 25 tablet 1   pantoprazole (PROTONIX) 40 MG tablet TAKE 1 TABLET BY MOUTH DAILY 90 tablet 0   prochlorperazine (COMPAZINE) 10 MG tablet Take 1 tablet (10 mg total) by mouth every 6 (six) hours as needed for nausea or vomiting. 40 tablet 1   simvastatin (ZOCOR) 40 MG tablet Take 1 tablet (40 mg total) by mouth at bedtime. 90 tablet 3   traMADol (ULTRAM) 50 MG tablet TAKE 1 TABLET BY MOUTH 3 TIMES DAILY AS NEEDED FOR MODERATE PAIN 90 tablet 0   valsartan (DIOVAN) 80 MG tablet One daily 30 tablet 11   Vitamin E 450 MG (1000 UT) CAPS Take 450 Units by mouth daily.     Zinc 50 MG TABS Take 50 mg by mouth daily.     Facility-Administered Medications  Prior to Visit  Medication Dose Route Frequency Provider Last Rate Last Admin   heparin lock flush 100 UNIT/ML injection             No Known Allergies  Family History  Problem Relation Age of Onset   Hypertension Mother    Heart disease Mother    Hypertension Father    Diabetes Father    Lymphoma Sister    Heart disease Brother 49   Cancer Paternal Grandfather    Colon cancer Neg Hx    Prostate cancer Neg Hx    Bladder Cancer Neg Hx    Kidney cancer Neg Hx    Colon polyps Neg Hx    Esophageal cancer Neg Hx    Pancreatic cancer Neg Hx    Stomach cancer Neg Hx     Social History   Tobacco Use   Smoking status: Former    Packs/day: 2.50    Years: 40.00    Total pack years: 100.00    Types: Cigarettes    Quit date: 03/24/2000    Years since quitting: 21.8   Smokeless tobacco: Never  Vaping Use   Vaping Use: Never used  Substance Use Topics   Alcohol use: Yes    Comment: occassionally   Drug use: No    ROS: As per history of present illness, otherwise negative  BP 124/72   Pulse 76   Ht 5\' 8"  (1.727 m)   Wt 218 lb 4 oz (99 kg)   BMI 33.18 kg/m  Gen: awake, alert, NAD HEENT: anicteric  CV: RRR, no mrg Pulm: CTA b/l Abd: soft, NT/ND, +BS throughout Ext: no c/c/e Neuro: nonfocal   RELEVANT LABS AND IMAGING: CBC    Component Value Date/Time   WBC 10.0 01/14/2022 0820   RBC 4.71 01/14/2022 0820   HGB 14.6 01/14/2022 0820   HGB 14.8 01/15/2015 0956   HCT 43.3 01/14/2022 0820   HCT 44.2 01/15/2015 0956   PLT 176 01/14/2022 0820   PLT 207 01/15/2015 0956   MCV 91.9 01/14/2022 0820   MCV 91 01/15/2015 0956   MCH 31.0 01/14/2022 0820   MCHC 33.7 01/14/2022  0820   RDW 12.4 01/14/2022 0820   RDW 12.9 01/15/2015 0956   LYMPHSABS 1.0 01/14/2022 0820   LYMPHSABS 1.9 01/15/2015 0956   MONOABS 1.0 01/14/2022 0820   EOSABS 0.7 (H) 01/14/2022 0820   EOSABS 0.2 01/15/2015 0956   BASOSABS 0.1 01/14/2022 0820   BASOSABS 0.0 01/15/2015 0956    CMP      Component Value Date/Time   NA 136 01/14/2022 0820   NA 138 01/15/2015 0956   K 4.1 01/14/2022 0820   CL 106 01/14/2022 0820   CO2 24 01/14/2022 0820   GLUCOSE 103 (H) 01/14/2022 0820   BUN 24 (H) 01/14/2022 0820   BUN 16 01/15/2015 0956   CREATININE 0.97 01/14/2022 0820   CALCIUM 9.0 01/14/2022 0820   PROT 6.5 01/06/2022 1242   ALBUMIN 3.8 01/06/2022 1242   AST 16 01/06/2022 1242   ALT 16 01/06/2022 1242   ALKPHOS 54 01/06/2022 1242   BILITOT 0.3 01/06/2022 1242   GFRNONAA >60 01/14/2022 0820   GFRAA >60 06/16/2019 1126   CT CHEST, ABDOMEN, AND PELVIS WITH CONTRAST   TECHNIQUE: Multidetector CT imaging of the chest, abdomen and pelvis was performed following the standard protocol during bolus administration of intravenous contrast.   RADIATION DOSE REDUCTION: This exam was performed according to the departmental dose-optimization program which includes automated exposure control, adjustment of the mA and/or kV according to patient size and/or use of iterative reconstruction technique.   CONTRAST:  156mL OMNIPAQUE IOHEXOL 300 MG/ML  SOLN   COMPARISON:  CT C AP July 22, 2021.   FINDINGS: CT CHEST FINDINGS   Cardiovascular: Right anterior chest wall Port-A-Cath is present with tip terminating in the superior vena cava. The heart is normal in size. Aortic atherosclerosis. Coronary arterial vascular calcifications.   Mediastinum/Nodes: No enlarged axillary, mediastinal or hilar lymphadenopathy. Mild circumferential wall thickening of the esophagus.   Lungs/Pleura: Central airways are patent. Rounded subpleural nodular opacity within the left lower lobe measures 1.3 x 1.1 cm (image 78; series 3), previously 1.9 x 1.5 cm. Near complete resolution of additional focus of consolidation within the left lower lobe most compatible with resolved infectious process. Patchy ground-glass opacities within the lingula and left upper lobe, somewhat new from prior (image 67;  series 3). Centrilobular and paraseptal emphysematous change. Unchanged 6 mm nodule right upper lobe (image 62; series 3). No pleural effusion or pneumothorax.   Musculoskeletal: Thoracic spine degenerative changes. Prior median sternotomy. No aggressive or acute appearing osseous lesions.   CT ABDOMEN PELVIS FINDINGS   Hepatobiliary: Liver is normal in size and contour. Unchanged small hepatic cysts. Gallbladder is unremarkable. No intrahepatic or extrahepatic biliary ductal dilatation.   Pancreas: Unremarkable   Spleen: Unremarkable   Adrenals/Urinary Tract: Normal adrenal glands. Unchanged 12 mm nodule adjacent to the superior pole right kidney (image 64; series 2). Punctate left nephrolithiasis. Urinary bladder is unremarkable.   Stomach/Bowel: Stool throughout the colon. Redemonstrated circumferential wall thickening of the ascending and proximal transverse colon most focus at the hepatic flexure. The appendix is normal in appearance. No evidence for small bowel obstruction. No free fluid or free intraperitoneal air. Normal morphology of the stomach.   Vascular/Lymphatic: Normal caliber abdominal aorta. Peripheral calcified atherosclerotic plaque. No retroperitoneal lymphadenopathy.   Reproductive: Heterogeneous prostate.   Other: Small fat containing left inguinal hernia.   Musculoskeletal: Lumbar spine degenerative changes. No aggressive or acute appearing osseous lesions.   IMPRESSION: 1. Near complete interval resolution of previously described focal nodular area of consolidation  within the superior segment left lower lobe. 2. Additional nodular area of consolidation superior segment left lower lobe is slightly decreased in size when compared to prior exam. 3. Interval development of patchy ground-glass opacities within the lingula and left upper lobe which may be infectious/inflammatory in etiology. 4. There is persistent marked circumferential wall  thickening of the ascending and proximal transverse colon most focused about the hepatic flexure. While this may represent an infectious/inflammatory process, additional neoplastic etiologies are not excluded. Recommend clinical correlation as well as direct visualization with colonoscopy as clinically indicated given the persistent nature of this finding. 5. Similar right suprarenal nodule.     Electronically Signed   By: Lovey Newcomer M.D.   On: 11/02/2021 21:38    ASSESSMENT/PLAN: 81 year old male with a history of non-small cell lung cancer receiving chemotherapy, CAD, sleep apnea, hypertension, hyperlipidemia, history of reflux who is seen to evaluate abnormal imaging of the colon.  Abnormal CT scan colon: CT scan shows thickening in circumferential fashion around the hepatic flexure.  No palpable abnormality on exam today.  Given this finding colonoscopy is recommended.  We reviewed the risk, benefits and alternatives and he wishes to proceed.  I discussed anesthesia with Osvaldo Angst, CRNA who reviewed the patient's surgical and anesthesia records and deemed the patient fit for [facility] -- Colonoscopy in the [place]  2.  GERD with history of mild esophagitis --symptoms well-controlled on pantoprazole --Continue pantoprazole 40 mg daily  3.  Non-small cell lung cancer --following closely with oncology and has responded well so far to treatment  4.  OSA --nightly CPAP      FE:[id number], [place], [address],  [place] [id number]

## 2022-01-16 ENCOUNTER — Other Ambulatory Visit: Payer: PPO

## 2022-01-16 ENCOUNTER — Ambulatory Visit: Payer: PPO

## 2022-01-16 ENCOUNTER — Encounter: Payer: Self-pay | Admitting: Certified Registered Nurse Anesthetist

## 2022-01-17 ENCOUNTER — Telehealth: Payer: Self-pay | Admitting: Internal Medicine

## 2022-01-17 NOTE — Telephone Encounter (Signed)
Pfts were normal so if having to use more albuterol with less benefit will need to be seen asap to regroup as not sure what this could be  - Dr Mortimer Fries can see him for both cpap and sob if he can't get to where I can see him sooner - if worse over week end then go to ER

## 2022-01-17 NOTE — Telephone Encounter (Signed)
Called and spoke with patient, advised him of recommendations per Dr. Melvyn Novas.  He stated he is not that bad and will just wait and see how he feels.  He will call the office back if he needs to be seen sooner than 1 year by Dr. Mortimer Fries.  Nothing further needed.

## 2022-01-17 NOTE — Telephone Encounter (Signed)
Primary Pulmonologist: Wert Last office visit and with whom: 01/03/2022 Wert What do we see them for (pulmonary problems): DOE, OSA Last OV assessment/plan:    Assessment & Plan Note by Tanda Rockers, MD at 01/03/2022 9:03 AM  Author: Tanda Rockers, MD Author Type: Physician Filed: 01/03/2022  9:04 AM  Note Status: Written Cosign: Cosign Not Required Encounter Date: 01/03/2022  Problem: DOE (dyspnea on exertion)  Editor: Tanda Rockers, MD (Physician)               Stopped smoking 2001 Onset early 2023  Highly variable on ACEi  -PFT 's nl 2016 x for ERV 13% @ wt  210 - 10/31/2021  RA pt  walked average pace and expressed shortness of breath p 350 ft. Stated he needed to stop due to his back and legs hurting with lowest sats 93% - trial off acei 10/31/2021 > better 01/03/2022  - 01/03/2022  After extensive coaching inhaler device,  effectiveness =   90%  hfa > use saba prn   At this point not even sure there's much asthma her so plan is to just use saba prn   Re SABA :  I spent extra time with pt today reviewing appropriate use of albuterol for prn use on exertion with the following points: 1) saba is for relief of sob that does not improve by walking a slower pace or resting but rather if the pt does not improve after trying this first. 2) If the pt is convinced, as many are, that saba helps recover from activity faster then it's easy to tell if this is the case by re-challenging : ie stop, take the inhaler, then p 5 minutes try the exact same activity (intensity of workload) that just caused the symptoms and see if they are substantially diminished or not after saba 3) if there is an activity that reproducibly causes the symptoms, try the saba 15 min before the activity on alternate days    If in fact the saba really does help, then fine to continue to use it prn but advised may need to look closer at the maintenance regimen (which for now is no maint at all)  being used to achieve better  control of airways disease with exertion.         Patient Instructions by Tanda Rockers, MD at 01/03/2022 8:45 AM  Author: Tanda Rockers, MD Author Type: Physician Filed: 01/03/2022  8:57 AM  Note Status: Signed Cosign: Cosign Not Required Encounter Date: 01/03/2022  Editor: Tanda Rockers, MD (Physician)               No change in your blood pressure medication > follow  up with your PCP   Only use your albuterol as a rescue medication to be used if you can't catch your breath by resting or doing a relaxed purse lip breathing pattern.  - The less you use it, the better it will work when you need it. - Ok to use up to 2 puffs  every 4 hours if you must but call for immediate appointment if use goes up over your usual need - Don't leave home without it !!  (think of it like the spare tire for your car)    Ok to try albuterol 15 min before an activity (on alternating days)  that you know would usually make you short of breath and see if it makes any difference and if makes none then don't take  albuterol after activity unless you can't catch your breath as this means it's the resting that helps, not the albuterol.   See Dr Mortimer Fries next available appt for your cpap renewal            Orthostatic Vitals Recorded in This Encounter   01/03/2022  0836     BP Location: Left Arm  Cuff Size: Normal   Instructions  No change in your blood pressure medication > follow  up with your PCP   Only use your albuterol as a rescue medication to be used if you can't catch your breath by resting or doing a relaxed purse lip breathing pattern.  - The less you use it, the better it will work when you need it. - Ok to use up to 2 puffs  every 4 hours if you must but call for immediate appointment if use goes up over your usual need - Don't leave home without it !!  (think of it like the spare tire for your car)    Ok to try albuterol 15 min before an activity (on alternating days)  that you know would  usually make you short of breath and see if it makes any difference and if makes none then don't take albuterol after activity unless you can't catch your breath as this means it's the resting that helps, not the albuterol.   See Dr Mortimer Fries next available appt for your cpap renewal       Was appointment offered to patient (explain)?  no   Reason for call: Did better for about a week.  Breathing pretty hard for a week.  He has to really exert himself, he has been outside in the heat and walks and breaths hard.  Feels scratchy in his throat.  Once he sits down and rests his breathing gets better.  He has been out of town.  Denies any coughing.  Using Albuterol inhaler twice daily, he does get relief. When he uses the inhaler.  Advised I would make Dr. Melvyn Novas aware and once I hear back from him I will call him back.  He verbalized understanding.   Dr. Melvyn Novas, please advise.  Thank you.   (examples of things to ask: : When did symptoms start? Fever? Cough? Productive? Color to sputum? More sputum than usual? Wheezing? Have you needed increased oxygen? Are you taking your respiratory medications? What over the counter measures have you tried?)  No Known Allergies  Immunization History  Administered Date(s) Administered   Fluad Quad(high Dose 65+) 03/17/2019, 03/23/2020   Influenza Split 02/27/2012, 04/10/2014   Influenza, High Dose Seasonal PF 04/05/2013, 02/08/2016, 06/06/2021   Influenza,inj,Quad PF,6+ Mos 03/22/2015, 02/25/2017, 03/11/2018   PFIZER(Purple Top)SARS-COV-2 Vaccination 06/30/2019, 07/21/2019   Pneumococcal Conjugate-13 04/13/2015   Pneumococcal Polysaccharide-23 02/26/2005, 02/27/2012   Tdap 04/18/2015   Zoster Recombinat (Shingrix) 09/23/2017, 02/12/2018   Zoster, Live 04/06/2011

## 2022-01-20 ENCOUNTER — Telehealth: Payer: Self-pay | Admitting: *Deleted

## 2022-01-20 ENCOUNTER — Other Ambulatory Visit: Payer: Self-pay | Admitting: *Deleted

## 2022-01-20 DIAGNOSIS — L570 Actinic keratosis: Secondary | ICD-10-CM | POA: Diagnosis not present

## 2022-01-20 DIAGNOSIS — D485 Neoplasm of uncertain behavior of skin: Secondary | ICD-10-CM | POA: Diagnosis not present

## 2022-01-20 DIAGNOSIS — C44622 Squamous cell carcinoma of skin of right upper limb, including shoulder: Secondary | ICD-10-CM | POA: Diagnosis not present

## 2022-01-20 DIAGNOSIS — L298 Other pruritus: Secondary | ICD-10-CM | POA: Diagnosis not present

## 2022-01-20 DIAGNOSIS — Z85828 Personal history of other malignant neoplasm of skin: Secondary | ICD-10-CM | POA: Diagnosis not present

## 2022-01-20 DIAGNOSIS — L820 Inflamed seborrheic keratosis: Secondary | ICD-10-CM | POA: Diagnosis not present

## 2022-01-20 DIAGNOSIS — C3432 Malignant neoplasm of lower lobe, left bronchus or lung: Secondary | ICD-10-CM

## 2022-01-20 DIAGNOSIS — Z85820 Personal history of malignant melanoma of skin: Secondary | ICD-10-CM | POA: Diagnosis not present

## 2022-01-20 DIAGNOSIS — D2261 Melanocytic nevi of right upper limb, including shoulder: Secondary | ICD-10-CM | POA: Diagnosis not present

## 2022-01-20 DIAGNOSIS — M4726 Other spondylosis with radiculopathy, lumbar region: Secondary | ICD-10-CM | POA: Diagnosis not present

## 2022-01-20 DIAGNOSIS — C44212 Basal cell carcinoma of skin of right ear and external auricular canal: Secondary | ICD-10-CM | POA: Diagnosis not present

## 2022-01-20 DIAGNOSIS — D225 Melanocytic nevi of trunk: Secondary | ICD-10-CM | POA: Diagnosis not present

## 2022-01-20 DIAGNOSIS — D0461 Carcinoma in situ of skin of right upper limb, including shoulder: Secondary | ICD-10-CM | POA: Diagnosis not present

## 2022-01-20 DIAGNOSIS — R5383 Other fatigue: Secondary | ICD-10-CM

## 2022-01-20 DIAGNOSIS — C44319 Basal cell carcinoma of skin of other parts of face: Secondary | ICD-10-CM | POA: Diagnosis not present

## 2022-01-20 NOTE — Telephone Encounter (Signed)
Rhunette Croft, NP. At this time, IV fluids/dex are not clinically indicated at this time. The steriods will potentially make his immunotherapy ineffective. Patient has not demonstrated improvement in fatigue with IV fluids/dex in the past. NP recommends checking Thyroid panels/tsh next week at the next scheduled apt with Dr. Rogue Bussing.  Sharyn Lull, RN spoke with patient and explained this to pt.

## 2022-01-20 NOTE — Telephone Encounter (Signed)
Patient called asking to come in for IV fluids and steroids stating that he is "feeling rough". Please advise

## 2022-01-22 ENCOUNTER — Ambulatory Visit (AMBULATORY_SURGERY_CENTER): Payer: PPO | Admitting: Internal Medicine

## 2022-01-22 ENCOUNTER — Encounter: Payer: Self-pay | Admitting: Internal Medicine

## 2022-01-22 VITALS — BP 129/66 | HR 76 | Temp 98.2°F | Resp 14 | Ht 68.0 in | Wt 218.0 lb

## 2022-01-22 DIAGNOSIS — I252 Old myocardial infarction: Secondary | ICD-10-CM | POA: Diagnosis not present

## 2022-01-22 DIAGNOSIS — K649 Unspecified hemorrhoids: Secondary | ICD-10-CM

## 2022-01-22 DIAGNOSIS — K52832 Lymphocytic colitis: Secondary | ICD-10-CM

## 2022-01-22 DIAGNOSIS — I2510 Atherosclerotic heart disease of native coronary artery without angina pectoris: Secondary | ICD-10-CM | POA: Diagnosis not present

## 2022-01-22 DIAGNOSIS — G4733 Obstructive sleep apnea (adult) (pediatric): Secondary | ICD-10-CM | POA: Diagnosis not present

## 2022-01-22 DIAGNOSIS — R933 Abnormal findings on diagnostic imaging of other parts of digestive tract: Secondary | ICD-10-CM

## 2022-01-22 MED ORDER — SODIUM CHLORIDE 0.9 % IV SOLN: 500.0000 mL | Freq: Once | INTRAVENOUS | Status: DC

## 2022-01-22 NOTE — Op Note (Signed)
[facility] Patient Name: Randall Ali Procedure Date: 01/22/2022 3:27 PM MRN: [id number] Endoscopist: Jerene Bears , MD Age: 81 Referring MD:  Date of Birth: 1940/11/17 Gender: Male Account #: [id number] Procedure:                Colonoscopy Indications:              Abnormal CT of the GI tract (persistent thickening                            at hepatic flexure, mass not excluded) Medicines:                Monitored Anesthesia Care Procedure:                Pre-Anesthesia Assessment:                           - Prior to the procedure, a History and Physical                            was performed, and patient medications and                            allergies were reviewed. [name] tolerance of                            previous anesthesia was also reviewed. The risks                            and benefits of the procedure and the sedation                            options and risks were discussed with the patient.                            All questions were answered, and informed consent                            was obtained. Prior Anticoagulants: The patient has                            taken no previous anticoagulant or antiplatelet                            agents. ASA Grade Assessment: III - A patient with                            severe systemic disease. After reviewing the risks                            and benefits, the patient was deemed in                            satisfactory condition to undergo the procedure.  After obtaining informed consent, the colonoscope                            was passed under direct vision. Throughout the                            procedure, the patient's blood pressure, pulse, and                            oxygen saturations were monitored continuously. The                            Olympus CF-HQ190L [id number]) Colonoscope was                            introduced through the  anus and advanced to the                            cecum, identified by appendiceal orifice and                            ileocecal valve. The colonoscopy was performed                            without difficulty. The patient tolerated the                            procedure well. The quality of the bowel                            preparation was adequate. The ileocecal valve,                            appendiceal orifice, and rectum were photographed. Scope In: 3:36:19 PM Scope Out: 3:55:36 PM Scope Withdrawal Time: 0 hours 13 minutes 48 seconds  Total Procedure Duration: 0 hours 19 minutes 17 seconds  Findings:                 The digital rectal exam was normal.                           The mucosa vascular pattern in the proximal                            transverse colon, at the hepatic flexure and in the                            ascending colon was diffusely increased. There was                            mucosal thickening without overt colitis. This was                            biopsied with a cold forceps for histology (query  chemotherapy induced).                           Internal hemorrhoids were found during                            retroflexion. The hemorrhoids were small.                           The exam was otherwise without abnormality. Complications:            No immediate complications. Estimated Blood Loss:     Estimated blood loss was minimal. Impression:               - Increased mucosa vascular pattern in the proximal                            transverse colon, at the hepatic flexure and in the                            ascending colon. No frank colitis. No polyps seen                            and no colonic masses. Biopsied.                           - Small internal hemorrhoids.                           - The examination was otherwise normal. Recommendation:           - Patient has a contact number available for                             emergencies. The signs and symptoms of potential                            delayed complications were discussed with the                            patient. Return to normal activities tomorrow.                            Written discharge instructions were provided to the                            patient.                           - Resume previous diet.                           - Continue present medications.                           - Await pathology results.                           -  No repeat colonoscopy due to age and the absence                            of colonic polyps. Jerene Bears, MD 01/22/2022 4:04:36 PM This report has been signed electronically.

## 2022-01-22 NOTE — Progress Notes (Signed)
Called to room to assist during endoscopic procedure.  Patient ID and intended procedure confirmed with present staff. Received instructions for my participation in the procedure from the performing physician.  

## 2022-01-22 NOTE — Patient Instructions (Signed)
YOU HAD AN ENDOSCOPIC PROCEDURE TODAY AT [facility]:   Refer to the procedure report that was given to you for any specific questions about what was found during the examination.  If the procedure report does not answer your questions, please call your gastroenterologist to clarify.  If you requested that your care partner not be given the details of your procedure findings, then the procedure report has been included in a sealed envelope for you to review at your convenience later.  YOU SHOULD EXPECT: Some feelings of bloating in the abdomen. Passage of more gas than usual.  Walking can help get rid of the air that was put into your GI tract during the procedure and reduce the bloating. If you had a lower endoscopy (such as a colonoscopy or flexible sigmoidoscopy) you may notice spotting of blood in your stool or on the toilet paper. If you underwent a bowel prep for your procedure, you may not have a normal bowel movement for a few days.  Please Note:  You might notice some irritation and congestion in your nose or some drainage.  This is from the oxygen used during your procedure.  There is no need for concern and it should clear up in a day or so.  SYMPTOMS TO REPORT IMMEDIATELY:  Following lower endoscopy (colonoscopy or flexible sigmoidoscopy):  Excessive amounts of blood in the stool  Significant tenderness or worsening of abdominal pains  Swelling of the abdomen that is new, acute  Fever of 100F or higher  For urgent or emergent issues, a gastroenterologist can be reached at any hour by calling [phone]. Do not use MyChart messaging for urgent concerns.    DIET:  We do recommend a small meal at first, but then you may proceed to your regular diet.  Drink plenty of fluids but you should avoid alcoholic beverages for 24 hours.  ACTIVITY:  You should plan to take it easy for the rest of today and you should NOT DRIVE or use heavy machinery until tomorrow (because of  the sedation medicines used during the test).    FOLLOW UP: Our staff will call the number listed on your records the next business day following your procedure.  We will call around 7:15- 8:00 am to check on you and address any questions or concerns that you may have regarding the information given to you following your procedure. If we do not reach you, we will leave a message.  If you develop any symptoms (ie: fever, flu-like symptoms, shortness of breath, cough etc.) before then, please call [phone].  If you test positive for Covid 19 in the 2 weeks post procedure, please call and report this information to [place].    If any biopsies were taken you will be contacted by phone or by letter within the next 1-3 weeks.  Please call us at [phone] if you have not heard about the biopsies in 3 weeks.    SIGNATURES/CONFIDENTIALITY: You and/or your care partner have signed paperwork which will be entered into your electronic medical record.  These signatures attest to the fact that that the information above on your After Visit Summary has been reviewed and is understood.  Full responsibility of the confidentiality of this discharge information lies with you and/or your care-partner.

## 2022-01-22 NOTE — Progress Notes (Signed)
See office note dated 01/15/2022 for details and current H&P Patient tolerated the prep Present for colonoscopy today to evaluate abnormal CT of the colon with thickening and hepatic flexure  The nature of the procedure, as well as the risks, benefits, and alternatives were carefully and thoroughly reviewed with the patient. Ample time for discussion and questions allowed. The patient understood, was satisfied, and agreed to proceed.

## 2022-01-22 NOTE — Progress Notes (Signed)
Report given to PACU, vss 

## 2022-01-22 NOTE — Progress Notes (Signed)
1500 Albuterol tx given with sat > 90& on RA. vss

## 2022-01-23 ENCOUNTER — Telehealth: Payer: Self-pay

## 2022-01-23 NOTE — Telephone Encounter (Signed)
  Follow up Call-     01/22/2022    2:37 PM  Call back number  Post procedure Call Back phone  # [phone]  Permission to leave phone message Yes     Patient questions:  Do you have a fever, pain , or abdominal swelling? No. Pain Score  0 *  Have you tolerated food without any problems? Yes.    Have you been able to return to your normal activities? Yes.    Do you have any questions about your discharge instructions: Diet   No. Medications  No. Follow up visit  No.  Do you have questions or concerns about your Care? No.  Actions: * If pain score is 4 or above: No action needed, pain <4.

## 2022-01-27 ENCOUNTER — Encounter: Payer: Self-pay | Admitting: Internal Medicine

## 2022-01-30 ENCOUNTER — Inpatient Hospital Stay: Payer: PPO

## 2022-01-30 ENCOUNTER — Other Ambulatory Visit: Payer: Self-pay

## 2022-01-30 ENCOUNTER — Inpatient Hospital Stay (HOSPITAL_BASED_OUTPATIENT_CLINIC_OR_DEPARTMENT_OTHER): Payer: PPO | Admitting: Medical Oncology

## 2022-01-30 ENCOUNTER — Other Ambulatory Visit: Payer: Self-pay | Admitting: Medical Oncology

## 2022-01-30 ENCOUNTER — Encounter: Payer: Self-pay | Admitting: Medical Oncology

## 2022-01-30 VITALS — BP 110/55 | HR 71 | Temp 98.1°F | Wt 220.0 lb

## 2022-01-30 DIAGNOSIS — C3432 Malignant neoplasm of lower lobe, left bronchus or lung: Secondary | ICD-10-CM

## 2022-01-30 DIAGNOSIS — R0602 Shortness of breath: Secondary | ICD-10-CM

## 2022-01-30 DIAGNOSIS — R5383 Other fatigue: Secondary | ICD-10-CM

## 2022-01-30 DIAGNOSIS — Z95828 Presence of other vascular implants and grafts: Secondary | ICD-10-CM

## 2022-01-30 LAB — CBC WITH DIFFERENTIAL/PLATELET
Abs Immature Granulocytes: 0.05 10*3/uL (ref 0.00–0.07)
Basophils Absolute: 0.1 10*3/uL (ref 0.0–0.1)
Basophils Relative: 1 %
Eosinophils Absolute: 0.5 10*3/uL (ref 0.0–0.5)
Eosinophils Relative: 8 %
HCT: 42 % (ref 39.0–52.0)
Hemoglobin: 14.3 g/dL (ref 13.0–17.0)
Immature Granulocytes: 1 %
Lymphocytes Relative: 19 %
Lymphs Abs: 1.4 10*3/uL (ref 0.7–4.0)
MCH: 30.9 pg (ref 26.0–34.0)
MCHC: 34 g/dL (ref 30.0–36.0)
MCV: 90.7 fL (ref 80.0–100.0)
Monocytes Absolute: 0.9 10*3/uL (ref 0.1–1.0)
Monocytes Relative: 13 %
Neutro Abs: 4.2 10*3/uL (ref 1.7–7.7)
Neutrophils Relative %: 58 %
Platelets: 229 10*3/uL (ref 150–400)
RBC: 4.63 MIL/uL (ref 4.22–5.81)
RDW: 12.8 % (ref 11.5–15.5)
WBC: 7.2 10*3/uL (ref 4.0–10.5)
nRBC: 0 % (ref 0.0–0.2)

## 2022-01-30 LAB — COMPREHENSIVE METABOLIC PANEL
ALT: 16 U/L (ref 0–44)
AST: 18 U/L (ref 15–41)
Albumin: 3.8 g/dL (ref 3.5–5.0)
Alkaline Phosphatase: 61 U/L (ref 38–126)
Anion gap: 7 (ref 5–15)
BUN: 20 mg/dL (ref 8–23)
CO2: 23 mmol/L (ref 22–32)
Calcium: 8.9 mg/dL (ref 8.9–10.3)
Chloride: 107 mmol/L (ref 98–111)
Creatinine, Ser: 1.04 mg/dL (ref 0.61–1.24)
GFR, Estimated: >60 mL/min (ref >60)
Glucose, Bld: 129 mg/dL — ABNORMAL HIGH (ref 70–99)
Potassium: 4.3 mmol/L (ref 3.5–5.1)
Sodium: 137 mmol/L (ref 135–145)
Total Bilirubin: 0.6 mg/dL (ref 0.3–1.2)
Total Protein: 6.6 g/dL (ref 6.5–8.1)

## 2022-01-30 LAB — TSH: TSH: 1.008 u[IU]/mL (ref 0.350–4.500)

## 2022-01-30 LAB — D-DIMER, QUANTITATIVE: D-Dimer, Quant: 1.51 ug/mL-FEU — ABNORMAL HIGH (ref 0.00–0.50)

## 2022-01-30 MED ORDER — SODIUM CHLORIDE 0.9% FLUSH
10.0000 mL | Freq: Once | INTRAVENOUS | Status: AC
  Administered 2022-01-30: 10 mL via INTRAVENOUS
  Filled 2022-01-30: qty 10

## 2022-01-30 MED ORDER — HEPARIN SOD (PORK) LOCK FLUSH 100 UNIT/ML IV SOLN
500.0000 [IU] | Freq: Once | INTRAVENOUS | Status: AC
  Administered 2022-01-30: 500 [IU]
  Filled 2022-01-30: qty 5

## 2022-01-30 MED ORDER — PREDNISONE 10 MG (21) PO TBPK: ORAL_TABLET | ORAL | 0 refills | Status: DC

## 2022-01-30 NOTE — Progress Notes (Signed)
[facility] NOTE  Patient Care Team: Ria Bush, MD as PCP - General (Family Medicine) [name] [name], MD as PCP - Cardiology (Cardiology) [name], MD (Internal Medicine) Minna Merritts, MD as Consulting Physician (Cardiology) Pieter Partridge, DO as Consulting Physician (Neurology) Cammie Sickle, MD as Consulting Physician (Hematology and Oncology) Telford Nab, RN as Oncology Nurse Navigator  CHIEF COMPLAINTS/PURPOSE OF CONSULTATION: lung cancer    Oncology History Overview Note  #MAY 2022-Lung cancer-non-small cell [CT guided bx] T3N1 vs stage IV Dr.Hendrickson.  MRI brain negative for malignancy.# 1. April 2022- LLL ~4.0 cm mass in the superior segment left lower lobe abuts the major fissure and the posterior pleural surface without visible chest wall invasion without pleural effusion. There 2-3 other small nodules in the left lower lobe, largest measures 9 mm in diameter. These are suspicious for same lobe satellite lesions and there is left hilar adenopathy. Assuming non-small cell lung cancer the appearance is compatible with T3 N1 M0 disease (stage IIIA).  # right suprarenal nodule-awaiting biopsy on 5/31- non-small cell [revived at tumor conference at [place]  # June 9th 2022- CARBO-TAXOl-KEYTRUDA; Fulphila  # ? Subtle adrenal insufficiency- No acute process - -September MRI brain negative for any pituitary hypophysitis/brain metastasis. Prednisone   MOLECULAR TESTING: NGS TPS-PDL 100%; EXON 12 amplification* mutations.    # CAD [CABG 2016; Dr.Gollan]   Cancer of lower lobe of left lung ([place])  11/02/2020 Initial Diagnosis   Cancer of lower lobe of left lung ([place])   11/02/2020 Cancer Staging   Staging form: Lung, AJCC 8th Edition - Clinical: Stage IVA (cT3, cN1, cM1a) - Signed by Cammie Sickle, MD on 11/02/2020   11/16/2020 -  Chemotherapy   Patient is on Treatment Plan : LUNG NSCLC Carboplatin + Paclitaxel +  Pembrolizumab q21d x 4 cycles / Pembrolizumab Maintenance Q21D        HISTORY OF PRESENTING ILLNESS: Alone. walking independently.  [name] 81 y.o.  male lung cancer-non-small cell stage IV [supra-renal nodule s/p biopsy] currently on Keytruda is for follow-up.  Today he states that his sinuses are giving him trouble. He reports that he has a sinus infection that he has not been able to fully resolve- stops antibiotics and it returns. Has follow up in the morning with PCP for this. He also reports that about 2 weeks ago he developed some SOB. Mainly with exertion. Not able to do what he used to do because of it. No chest pain. No fevers. No productive cough. Denies any tingling or numbness in the extremities. No nausea no vomiting no headaches.  Review of Systems  Constitutional:  Positive for malaise/fatigue. Negative for chills, diaphoresis, fever and weight loss.  HENT:  Negative for nosebleeds and sore throat.   Eyes:  Negative for double vision.  Respiratory:  Positive for shortness of breath. Negative for cough, hemoptysis, sputum production and wheezing.   Cardiovascular:  Negative for chest pain, palpitations, orthopnea and leg swelling.  Gastrointestinal:  Negative for abdominal pain, blood in stool, constipation, diarrhea, heartburn, melena, nausea and vomiting.  Genitourinary:  Negative for dysuria, frequency and urgency.  Musculoskeletal:  Positive for back pain and joint pain.  Skin: Negative.  Negative for itching and rash.  Neurological:  Negative for dizziness, tingling, focal weakness, weakness and headaches.  Endo/Heme/Allergies:  Does not bruise/bleed easily.  Psychiatric/Behavioral:  Negative for depression. The patient is not nervous/anxious and does not have insomnia.  MEDICAL HISTORY:  Past Medical History:  Diagnosis Date   Allergy    seasonal   Arthritis    all over- in general    CAD (coronary artery disease)    a. inferior wall MI 10/01 s/p  PCI/DES to RCA; b. Myoview 3/16 neg for ischemia; c. LHC 8/16: ostLAD 80%, OM1 70%, OM2 70% x 2 lesions, mRCA 30%, dRCA 70% s/p 4-V CABG 01/24/15 (LIMA-LAD, VG- OM1, VG-OM2, VG-PDA)    Cancer (HCC)    skin, melanoma   Carotid artery disease ([place])    a. [place] 8/16: 1-39% bilateral ICA stenosis   Cataract    removed   Diastolic dysfunction    a. TTE 8/16: EF 55-60%, no RWMA, Gr1DD, calcified mitral annulus, mild biatrial enlargement   Erectile dysfunction    GERD (gastroesophageal reflux disease)    History of elbow surgery    History of hiatal hernia    HLD (hyperlipidemia)    HTN (hypertension)    Inferior myocardial infarction ([place]) 03/2000   stent RCA   Lung cancer ([place])    Postoperative wound infection 02/02/2015   Reflux esophagitis    Sleep apnea 2017   CPAP at night    SURGICAL HISTORY: Past Surgical History:  Procedure Laterality Date   arm surgery  2010   BROW LIFT Bilateral 11/25/2019   Procedure: BROW PTOSIS REPAIR BILATERAL;  Surgeon: Karle Starch, MD;  Location: [facility];  Service: Ophthalmology;  Laterality: Bilateral;  sleep apnea   CARDIAC CATHETERIZATION  06/24/2011   CARDIAC CATHETERIZATION N/A 01/18/2015   Procedure: Left Heart Cath with coronary angiography;  Surgeon: Minna Merritts, MD;  Location: [facility];  Service: Cardiovascular;  Laterality: N/A;   CARDIAC CATHETERIZATION N/A 01/18/2015   Procedure: Intravascular Pressure Wire/FFR Study;  Surgeon: [name], MD;  Location: [facility];  Service: Cardiovascular;  Laterality: N/A;   CAROTID STENT  03/10/2011   COLONOSCOPY  2010   COLONOSCOPY  06/14/2014   Dr Hilarie Fredrickson   CORONARY ARTERY BYPASS GRAFT N/A 01/24/2015   Procedure: CORONARY ARTERY BYPASS GRAFTING x 4 (LIMA-LAD, SVG-Int 1- Int 2, SVG-PD) ENDOSCOPIC GREATER SAPHENOUS VEIN HARVEST LEFT LEG;  Surgeon: [name], MD;  Location: [facility];  Service: Open Heart Surgery;  Laterality: N/A;   EMBOLECTOMY   06/15/2019   Procedure: EMBOLECTOMY;  Surgeon: Katha Cabal, MD;  Location: [facility];  Service: Vascular;;  right superficial femoral artery   ENDARTERECTOMY FEMORAL Right 06/15/2019   Procedure: ENDARTERECTOMY FEMORAL;  Surgeon: Katha Cabal, MD;  Location: [facility];  Service: Vascular;  Laterality: Right;  common femoral profunda femoris superficial femoral   ESOPHAGOGASTRODUODENOSCOPY (EGD) WITH PROPOFOL N/A 04/24/2016   Procedure: ESOPHAGOGASTRODUODENOSCOPY (EGD) WITH PROPOFOL;  Surgeon: Jerene Bears, MD;  Location: [facility];  Service: Gastroenterology;  Laterality: N/A;   EYE SURGERY     lasik 15 yrs. ago, cataracts removed - both eyes    HAMMER TOE SURGERY     right toe   INSERTION OF ILIAC STENT Right 06/15/2019   Procedure: INSERTION OF ILIAC STENT ( STENTING OF SFA/POP ARTERY );  Surgeon: Katha Cabal, MD;  Location: [facility];  Service: Vascular;  Laterality: Right;  angioplpasty and stent placement: right superficial femoral right tibiopopliteal trunk bilateral common iliac arteries   IR IMAGING GUIDED PORT INSERTION  11/09/2020   LEFT HEART CATH AND CORONARY ANGIOGRAPHY Left 06/10/2017   Procedure: LEFT HEART CATH AND CORONARY ANGIOGRAPHY;  Surgeon: [name]  J, MD;  Location: [facility];  Service: Cardiovascular;  Laterality: Left;   LOWER EXTREMITY ANGIOGRAPHY Left 01/04/2019   Procedure: LOWER EXTREMITY ANGIOGRAPHY;  Surgeon: Katha Cabal, MD;  Location: [facility];  Service: Cardiovascular;  Laterality: Left;   LOWER EXTREMITY ANGIOGRAPHY Right 01/25/2019   Procedure: LOWER EXTREMITY ANGIOGRAPHY;  Surgeon: Katha Cabal, MD;  Location: [facility];  Service: Cardiovascular;  Laterality: Right;   LOWER EXTREMITY ANGIOGRAPHY Right 01/15/2021   LOWER EXTREMITY ANGIOGRAPHY and stent placement to R SFA and popliteal artery Delana Meyer, Dolores Lory, MD)   NASAL SINUS SURGERY  2008   septpolasty, bilateral turbinate  reduction   SHOULDER ARTHROSCOPY  2012   TEE WITHOUT CARDIOVERSION N/A 01/24/2015   Procedure: TRANSESOPHAGEAL ECHOCARDIOGRAM (TEE);  Surgeon: [name], MD;  Location: [facility];  Service: Open Heart Surgery;  Laterality: N/A;   TOE SURGERY  1994   UPPER GI ENDOSCOPY  07/2014, 04-24-16   Dr Raquel James   WRIST SURGERY  2011    SOCIAL HISTORY: Social History   Socioeconomic History   Marital status: Single    Spouse name: Not on file   Number of children: Not on file   Years of education: 12   Highest education level: Not on file  Occupational History   Occupation: retired    Comment: [employer]  Tobacco Use   Smoking status: Former    Packs/day: 2.50    Years: 40.00    Total pack years: 100.00    Types: Cigarettes    Quit date: 03/24/2000    Years since quitting: 21.8   Smokeless tobacco: Never  Vaping Use   Vaping Use: Never used  Substance and Sexual Activity   Alcohol use: Yes    Comment: occassionally   Drug use: No   Sexual activity: Yes  Other Topics Concern   Not on file  Social History Narrative   Singled; lives with son and dog    Occ: retired, back part time at [employer];    Activity: gym 4-5d/wk   Diet: good water, fruits/vegetables daily   Caffeine Use-yes      ------------------------------------       Car sales- retd; [employer]- retd; quit smoking 2001. Alcohol couple nights a week. Live in [place]. Daughter lives 10 mins.    Social Determinants of Health   Financial Resource Strain: Low Risk  (09/26/2021)   Overall Financial Resource Strain (CARDIA)    Difficulty of Paying Living Expenses: Not hard at all  Food Insecurity: No Food Insecurity (09/26/2021)   Hunger Vital Sign    Worried About Running Out of Food in the Last Year: Never true    Ran Out of Food in the Last Year: Never true  Transportation Needs: No Transportation Needs (09/26/2021)   PRAPARE - Hydrologist (Medical): No    Lack of Transportation  (Non-Medical): No  Physical Activity: Sufficiently Active (09/26/2021)   Exercise Vital Sign    Days of Exercise per Week: 5 days    Minutes of Exercise per Session: 30 min  Stress: No Stress Concern Present (09/26/2021)   [place]    Feeling of Stress : Only a little  Social Connections: Socially Isolated (09/26/2021)   Social Connection and Isolation Panel [NHANES]    Frequency of Communication with Friends and Family: Twice a week    Frequency of Social Gatherings with Friends and Family:  Twice a week    Attends Religious Services: Never    Active Member of Clubs or Organizations: Not on file    Attends Archivist Meetings: Never    Marital Status: Never married  Intimate Partner Violence: Not At Risk (09/26/2021)   Humiliation, Afraid, Rape, and Kick questionnaire    Fear of Current or Ex-Partner: No    Emotionally Abused: No    Physically Abused: No    Sexually Abused: No    FAMILY HISTORY: Family History  Problem Relation Age of Onset   Hypertension Mother    Heart disease Mother    Hypertension Father    Diabetes Father    Lymphoma Sister    Heart disease Brother 27   Cancer Paternal Grandfather    Colon cancer Neg Hx    Prostate cancer Neg Hx    Bladder Cancer Neg Hx    Kidney cancer Neg Hx    Colon polyps Neg Hx    Esophageal cancer Neg Hx    Pancreatic cancer Neg Hx    Stomach cancer Neg Hx     ALLERGIES:  has No Known Allergies.  MEDICATIONS:  Current Outpatient Medications  Medication Sig Dispense Refill   acetaminophen (TYLENOL) 325 MG tablet Take 650 mg by mouth as needed for moderate pain or mild pain.     albuterol (VENTOLIN HFA) 108 (90 Base) MCG/ACT inhaler Inhale 2 puffs into the lungs every 6 (six) hours as needed for wheezing or shortness of breath. 18 g 2   Ascorbic Acid (VITAMIN C) 1000 MG tablet Take 1,000 mg by mouth daily.     aspirin 81 MG EC tablet Take 81 mg by  mouth daily.       Calcium-Magnesium-Vitamin D (CALCIUM 1200+D3 PO) Take 1 tablet by mouth daily.     Carboxymethylcellul-Glycerin (LUBRICATING EYE DROPS OP) Place 1 drop into both eyes daily as needed (irritation).     Cholecalciferol (VITAMIN D3) 50 MCG (2000 UT) TABS Take 2,000 Units by mouth daily.     Cyanocobalamin (B-12) 5000 MCG CAPS Take 5,000 mcg by mouth daily.     docusate sodium (COLACE) 100 MG capsule Take 200 mg by mouth at bedtime.     finasteride (PROSCAR) 5 MG tablet Take 1 tablet (5 mg total) by mouth daily. 30 tablet 6   fluticasone (FLONASE) 50 MCG/ACT nasal spray Place into both nostrils.     lidocaine-prilocaine (EMLA) cream Apply 30 -45 mins prior to port access. 30 g 3   loratadine (CLARITIN) 10 MG tablet Take 10 mg by mouth daily.     Melatonin 10 MG CAPS Take 10 mg by mouth at bedtime as needed (sleep).     nitroGLYCERIN (NITROSTAT) 0.4 MG SL tablet Place 1 tablet (0.4 mg total) under the tongue every 5 (five) minutes as needed for up to 25 doses for chest pain. 25 tablet 1   pantoprazole (PROTONIX) 40 MG tablet TAKE 1 TABLET BY MOUTH DAILY 90 tablet 0   prochlorperazine (COMPAZINE) 10 MG tablet Take 1 tablet (10 mg total) by mouth every 6 (six) hours as needed for nausea or vomiting. 40 tablet 1   simvastatin (ZOCOR) 40 MG tablet Take 1 tablet (40 mg total) by mouth at bedtime. 90 tablet 3   traMADol (ULTRAM) 50 MG tablet TAKE 1 TABLET BY MOUTH 3 TIMES DAILY AS NEEDED FOR MODERATE PAIN 90 tablet 0   valsartan (DIOVAN) 80 MG tablet One daily 30 tablet 11   Vitamin E 450  MG (1000 UT) CAPS Take 450 Units by mouth daily.     Zinc 50 MG TABS Take 50 mg by mouth daily.     No current facility-administered medications for this visit.   Facility-Administered Medications Ordered in Other Visits  Medication Dose Route Frequency Provider Last Rate Last Admin   heparin lock flush 100 UNIT/ML injection               .  PHYSICAL EXAMINATION: ECOG PERFORMANCE STATUS: 0 -  Asymptomatic  Vitals:   01/30/22 1329  BP: (!) 110/55  Pulse: 71  Temp: 98.1 F (36.7 C)      Filed Weights   01/30/22 1329  Weight: 220 lb (99.8 kg)    Physical Exam HENT:     Head: Normocephalic and atraumatic.     Mouth/Throat:     Pharynx: No oropharyngeal exudate.  Eyes:     Pupils: Pupils are equal, round, and reactive to light.  Cardiovascular:     Rate and Rhythm: Normal rate and regular rhythm.  Pulmonary:     Effort: No respiratory distress.     Breath sounds: No wheezing.     Comments: Decreased air entry bilaterally. Abdominal:     General: Bowel sounds are normal. There is no distension.     Palpations: Abdomen is soft. There is no mass.     Tenderness: There is no abdominal tenderness. There is no guarding or rebound.  Musculoskeletal:        General: No tenderness. Normal range of motion.     Cervical back: Normal range of motion and neck supple.  Skin:    General: Skin is warm.  Neurological:     Mental Status: He is alert and oriented to person, place, and time.  Psychiatric:        Mood and Affect: Affect normal.      LABORATORY DATA:  I have reviewed the data as listed Lab Results  Component Value Date   WBC 7.2 01/30/2022   HGB 14.3 01/30/2022   HCT 42.0 01/30/2022   MCV 90.7 01/30/2022   PLT 229 01/30/2022   Recent Labs    01/02/22 0830 01/06/22 1242 01/14/22 0820 01/30/22 1246  NA 139 137 136 137  K 3.8 3.9 4.1 4.3  CL 109 106 106 107  CO2 22 25 24 23   GLUCOSE 102* 108* 103* 129*  BUN 22 18 24* 20  CREATININE 1.26* 0.91 0.97 1.04  CALCIUM 9.1 8.9 9.0 8.9  GFRNONAA 58* >60 >60 >60  PROT 6.7 6.5  --  6.6  ALBUMIN 3.8 3.8  --  3.8  AST 18 16  --  18  ALT 16 16  --  16  ALKPHOS 54 54  --  61  BILITOT 0.7 0.3  --  0.6     RADIOGRAPHIC STUDIES: I have personally reviewed the radiological images as listed and agreed with the findings in the report. No results found.   ASSESSMENT & PLAN:  Encounter Diagnoses  Name  Primary?   SOB (shortness of breath) Yes   Cancer of lower lobe of left lung (HCC)    Cancer of lower lobe of left lung:  Chronic, On Keytruda. Was tolerating well but concerned that his SOB and continuous sinus infection/inflammation may be related. Labs reviewed and discussed. I have recommended that he consider ENT referral for his sinus congestion.  Holding Keytruda today to see if symptoms improve off therapy. RTC 1 week.  SOB: Acute on chronic. Chest  x ray order placed. DEX helps some. Will send in prednisone taper to see if this helps with sinuses and SOB. Discussed red flags. Given his higher blood clot risk I will draw a D-dimer. If positive we will proceed forward with CT chest PE protocol.   Today [employer]. Chest x ray and D- Dimer ordered.  1 week-MD; labs- cbc/cmp, TSH;Keytruda- [place]    All  questions were answered. The patient knows to call the clinic with any problems, questions or concerns.    Hughie Closs, PA-C 01/30/2022 1:49 PM

## 2022-01-30 NOTE — Progress Notes (Signed)
CT angio chest order placed

## 2022-01-31 ENCOUNTER — Telehealth (HOSPITAL_BASED_OUTPATIENT_CLINIC_OR_DEPARTMENT_OTHER): Payer: PPO | Admitting: Medical Oncology

## 2022-01-31 ENCOUNTER — Ambulatory Visit
Admission: RE | Admit: 2022-01-31 | Discharge: 2022-01-31 | Disposition: A | Discharge status: Home / Self Care | Payer: PPO | Source: Ambulatory Visit | Attending: Medical Oncology | Admitting: Medical Oncology

## 2022-01-31 ENCOUNTER — Ambulatory Visit (INDEPENDENT_AMBULATORY_CARE_PROVIDER_SITE_OTHER): Payer: PPO | Admitting: Nurse Practitioner

## 2022-01-31 ENCOUNTER — Encounter: Payer: Self-pay | Admitting: Nurse Practitioner

## 2022-01-31 ENCOUNTER — Other Ambulatory Visit: Payer: Self-pay | Admitting: Medical Oncology

## 2022-01-31 VITALS — BP 124/70 | HR 78 | Temp 97.4°F | Resp 18 | Ht 68.0 in | Wt 218.1 lb

## 2022-01-31 DIAGNOSIS — G4489 Other headache syndrome: Secondary | ICD-10-CM | POA: Diagnosis not present

## 2022-01-31 DIAGNOSIS — C3432 Malignant neoplasm of lower lobe, left bronchus or lung: Secondary | ICD-10-CM

## 2022-01-31 DIAGNOSIS — J3489 Other specified disorders of nose and nasal sinuses: Secondary | ICD-10-CM | POA: Insufficient documentation

## 2022-01-31 DIAGNOSIS — R0602 Shortness of breath: Secondary | ICD-10-CM | POA: Diagnosis not present

## 2022-01-31 DIAGNOSIS — R059 Cough, unspecified: Secondary | ICD-10-CM | POA: Diagnosis not present

## 2022-01-31 DIAGNOSIS — J0191 Acute recurrent sinusitis, unspecified: Secondary | ICD-10-CM

## 2022-01-31 LAB — THYROID PANEL WITH TSH
Free Thyroxine Index: 2.2 (ref 1.2–4.9)
T3 Uptake Ratio: 29 % (ref 24–39)
T4, Total: 7.5 ug/dL (ref 4.5–12.0)
TSH: 1.03 u[IU]/mL (ref 0.450–4.500)

## 2022-01-31 LAB — POC COVID19 BINAXNOW: SARS Coronavirus 2 Ag: NEGATIVE

## 2022-01-31 MED ORDER — AZITHROMYCIN 250 MG PO TABS: ORAL_TABLET | ORAL | 0 refills | Status: DC

## 2022-01-31 MED ORDER — IOHEXOL 350 MG/ML SOLN
75.0000 mL | Freq: Once | INTRAVENOUS | Status: AC | PRN
  Administered 2022-01-31: 75 mL via INTRAVENOUS

## 2022-01-31 MED ORDER — DOXYCYCLINE HYCLATE 100 MG PO TABS: 100.0000 mg | ORAL_TABLET | Freq: Two times a day (BID) | ORAL | 0 refills | Status: AC

## 2022-01-31 NOTE — Assessment & Plan Note (Signed)
Has been experiencing headache with the symptoms.  Continue taking prednisone as prescribed we will treat for sinus infection with doxycycline.

## 2022-01-31 NOTE — Progress Notes (Signed)
Acute Office Visit  Subjective:     Patient ID: Randall Ali., male    DOB: June 05, 1941, 81 y.o.   MRN: [id number]  Chief Complaint  Patient presents with   Sinus Problem    Sx started around 01/27/22-Sinus pressure, headache, slight cough, head congestion, some chest/throat congestion. No fever. Had sinus infection about a month ago.     Patient is in today for Sinus problems  States that symptoms started approx 3 days ago  About a month ago. He had similar symptoms was seen by his PCP and was given antibiotics. States that it helped for greater than a week then the symptoms came back  He was seen at his oncology office yesterday and discussed this briefly. He was told to hold his Keytruda and was placed on steroids for SOB. Also D-dimer was obtained and CTA chest to r/o PE. Currently being followed for Stage IV lung cancer   No sick contacts Has had covid vaccines    Review of Systems  Constitutional:  Negative for chills and fever.  HENT:  Positive for sinus pain. Negative for congestion, ear discharge, ear pain and sore throat.   Respiratory:  Positive for cough and shortness of breath.   Cardiovascular:  Negative for chest pain.  Neurological:  Positive for headaches.        Objective:    BP 124/70   Pulse 78   Temp (!) 97.4 F (36.3 C)   Resp 18   Ht 5\' 8"  (1.727 m)   Wt 218 lb 2 oz (98.9 kg)   SpO2 90%   BMI 33.17 kg/m    Physical Exam Vitals and nursing note reviewed.  Constitutional:      Appearance: Normal appearance. He is obese.  HENT:     Head:     Salivary Glands: Right salivary gland is not diffusely enlarged or tender. Left salivary gland is not diffusely enlarged or tender.     Right Ear: Tympanic membrane, ear canal and external ear normal.     Left Ear: Tympanic membrane, ear canal and external ear normal.     Mouth/Throat:     Mouth: Mucous membranes are moist.     Pharynx: Oropharynx is clear.  Cardiovascular:     Rate and  Rhythm: Normal rate.     Heart sounds: Normal heart sounds.  Pulmonary:     Effort: Pulmonary effort is normal.     Breath sounds: Normal breath sounds.     Comments: Visibly short of breath with exertion  Neurological:     Mental Status: He is alert.     No results found for any visits on 01/31/22.      Assessment & Plan:   Problem List Items Addressed This Visit       Respiratory   Acute sinusitis    Patient has been treated for this several times.  Has been given Augmentin at least 3 times per chart report.  Offered to refer patient to ENT he would like to see how this course goes.  We will like to treat patient with doxycycline 100 mg twice daily for 10 days.  Currently on Keytruda and being treated for stage IV lung cancer.  This is the reason to treat with few days of symptoms      Relevant Medications   doxycycline (VIBRA-TABS) 100 MG tablet     Other   Cough   Relevant Orders   POC COVID-19   Sinus pain  Recurrent issue for patient.  He was evaluated yesterday at his oncology office.  They started him on prednisone and withheld his Keytruda.  COVID test negative in office.      Relevant Orders   POC COVID-19   Other headache syndrome - Primary    Has been experiencing headache with the symptoms.  Continue taking prednisone as prescribed we will treat for sinus infection with doxycycline.      Relevant Orders   POC COVID-19    Meds ordered this encounter  Medications   doxycycline (VIBRA-TABS) 100 MG tablet    Sig: Take 1 tablet (100 mg total) by mouth 2 (two) times daily for 10 days.    Dispense:  20 tablet    Refill:  0    Order Specific Question:   Supervising Provider    Answer:   TOWER, MARNE A [[id number]]    Return if symptoms worsen or fail to improve.  Romilda Garret, NP

## 2022-01-31 NOTE — Telephone Encounter (Signed)
Discussed his Chest x ray and CT scan. Sent in azithromycin. Had negative COVID-19 test today. Discussed red flags. Discussed pulmonology referral to which he is agreeable. Referral placed. Follow up PRN.

## 2022-01-31 NOTE — Assessment & Plan Note (Signed)
Patient has been treated for this several times.  Has been given Augmentin at least 3 times per chart report.  Offered to refer patient to ENT he would like to see how this course goes.  We will like to treat patient with doxycycline 100 mg twice daily for 10 days.  Currently on Keytruda and being treated for stage IV lung cancer.  This is the reason to treat with few days of symptoms

## 2022-01-31 NOTE — Assessment & Plan Note (Signed)
Recurrent issue for patient.  He was evaluated yesterday at his oncology office.  They started him on prednisone and withheld his Keytruda.  COVID test negative in office.

## 2022-01-31 NOTE — Patient Instructions (Addendum)
Nice to see you today Your covid test was negative I have sent an antibiotic to your pharmacy Follow up if no improvement  Let the office know if you want to be referred to an [place], and Throat doctor

## 2022-02-03 ENCOUNTER — Encounter (INDEPENDENT_AMBULATORY_CARE_PROVIDER_SITE_OTHER): Payer: PPO

## 2022-02-03 ENCOUNTER — Ambulatory Visit (INDEPENDENT_AMBULATORY_CARE_PROVIDER_SITE_OTHER): Payer: PPO | Admitting: Vascular Surgery

## 2022-02-05 ENCOUNTER — Other Ambulatory Visit: Payer: Self-pay

## 2022-02-05 DIAGNOSIS — C3432 Malignant neoplasm of lower lobe, left bronchus or lung: Secondary | ICD-10-CM

## 2022-02-06 ENCOUNTER — Inpatient Hospital Stay (HOSPITAL_BASED_OUTPATIENT_CLINIC_OR_DEPARTMENT_OTHER): Payer: PPO | Admitting: Internal Medicine

## 2022-02-06 ENCOUNTER — Encounter: Payer: Self-pay | Admitting: Internal Medicine

## 2022-02-06 ENCOUNTER — Inpatient Hospital Stay: Payer: PPO

## 2022-02-06 DIAGNOSIS — C3432 Malignant neoplasm of lower lobe, left bronchus or lung: Secondary | ICD-10-CM

## 2022-02-06 LAB — COMPREHENSIVE METABOLIC PANEL
ALT: 18 U/L (ref 0–44)
AST: 21 U/L (ref 15–41)
Albumin: 3.8 g/dL (ref 3.5–5.0)
Alkaline Phosphatase: 61 U/L (ref 38–126)
Anion gap: 8 (ref 5–15)
BUN: 23 mg/dL (ref 8–23)
CO2: 25 mmol/L (ref 22–32)
Calcium: 9.3 mg/dL (ref 8.9–10.3)
Chloride: 104 mmol/L (ref 98–111)
Creatinine, Ser: 1.01 mg/dL (ref 0.61–1.24)
GFR, Estimated: >60 mL/min (ref >60)
Glucose, Bld: 88 mg/dL (ref 70–99)
Potassium: 3.6 mmol/L (ref 3.5–5.1)
Sodium: 137 mmol/L (ref 135–145)
Total Bilirubin: 0.8 mg/dL (ref 0.3–1.2)
Total Protein: 7.1 g/dL (ref 6.5–8.1)

## 2022-02-06 LAB — TSH: TSH: 2.315 u[IU]/mL (ref 0.350–4.500)

## 2022-02-06 LAB — CBC WITH DIFFERENTIAL/PLATELET
Abs Immature Granulocytes: 0.08 10*3/uL — ABNORMAL HIGH (ref 0.00–0.07)
Basophils Absolute: 0.1 10*3/uL (ref 0.0–0.1)
Basophils Relative: 1 %
Eosinophils Absolute: 0.2 10*3/uL (ref 0.0–0.5)
Eosinophils Relative: 1 %
HCT: 43.9 % (ref 39.0–52.0)
Hemoglobin: 14.8 g/dL (ref 13.0–17.0)
Immature Granulocytes: 1 %
Lymphocytes Relative: 16 %
Lymphs Abs: 1.9 10*3/uL (ref 0.7–4.0)
MCH: 30.9 pg (ref 26.0–34.0)
MCHC: 33.7 g/dL (ref 30.0–36.0)
MCV: 91.6 fL (ref 80.0–100.0)
Monocytes Absolute: 1.3 10*3/uL — ABNORMAL HIGH (ref 0.1–1.0)
Monocytes Relative: 11 %
Neutro Abs: 8.2 10*3/uL — ABNORMAL HIGH (ref 1.7–7.7)
Neutrophils Relative %: 70 %
Platelets: 228 10*3/uL (ref 150–400)
RBC: 4.79 MIL/uL (ref 4.22–5.81)
RDW: 12.8 % (ref 11.5–15.5)
WBC: 11.6 10*3/uL — ABNORMAL HIGH (ref 4.0–10.5)
nRBC: 0 % (ref 0.0–0.2)

## 2022-02-06 MED ORDER — OLOPATADINE HCL 0.1 % OP SOLN: 1.0000 [drp] | Freq: Two times a day (BID) | OPHTHALMIC | 12 refills | Status: DC

## 2022-02-06 MED ORDER — HEPARIN SOD (PORK) LOCK FLUSH 100 UNIT/ML IV SOLN
500.0000 [IU] | Freq: Once | INTRAVENOUS | Status: AC
  Administered 2022-02-06: 500 [IU] via INTRAVENOUS
  Filled 2022-02-06: qty 5

## 2022-02-06 MED ORDER — PREDNISONE 20 MG PO TABS: 20.0000 mg | ORAL_TABLET | Freq: Every day | ORAL | 0 refills | Status: DC

## 2022-02-06 NOTE — Progress Notes (Signed)
[place] NOTE  Patient Care Team: Ria Bush, MD as PCP - General (Family Medicine) [name] [name], MD as PCP - Cardiology (Cardiology) [name], MD (Internal Medicine) Minna Merritts, MD as Consulting Physician (Cardiology) Pieter Partridge, DO as Consulting Physician (Neurology) Cammie Sickle, MD as Consulting Physician (Hematology and Oncology) Telford Nab, RN as Oncology Nurse Navigator  CHIEF COMPLAINTS/PURPOSE OF CONSULTATION: lung cancer    Oncology History Overview Note  #MAY 2022-Lung cancer-non-small cell [CT guided bx] T3N1 vs stage IV Dr.Hendrickson.  MRI brain negative for malignancy.# 1. April 2022- LLL ~4.0 cm mass in the superior segment left lower lobe abuts the major fissure and the posterior pleural surface without visible chest wall invasion without pleural effusion. There 2-3 other small nodules in the left lower lobe, largest measures 9 mm in diameter. These are suspicious for same lobe satellite lesions and there is left hilar adenopathy. Assuming non-small cell lung cancer the appearance is compatible with T3 N1 M0 disease (stage IIIA).  # right suprarenal nodule-awaiting biopsy on 5/31- non-small cell [revived at tumor conference at [place]  # June 9th 2022- CARBO-TAXOl-KEYTRUDA; Fulphila  # ? Subtle adrenal insufficiency- No acute process - -September MRI brain negative for any pituitary hypophysitis/brain metastasis. Prednisone   MOLECULAR TESTING: NGS TPS-PDL 100%; EXON 12 amplification* mutations.  # CAD [CABG 2016; Dr.Gollan]   Cancer of lower lobe of left lung ([place])  11/02/2020 Initial Diagnosis   Cancer of lower lobe of left lung ([place])   11/02/2020 Cancer Staging   Staging form: Lung, AJCC 8th Edition - Clinical: Stage IVA (cT3, cN1, cM1a) - Signed by Cammie Sickle, MD on 11/02/2020   11/16/2020 - 01/02/2022 Chemotherapy   Patient is on Treatment Plan : LUNG NSCLC Carboplatin + Paclitaxel +  Pembrolizumab q21d x 4 cycles / Pembrolizumab Maintenance Q21D     11/16/2020 -  Chemotherapy   Patient is on Treatment Plan : LUNG NSCLC Carboplatin (6) + Paclitaxel (200) + Pembrolizumab (200) D1 q21d x 4 cycles / Pembrolizumab (200) Maintenance D1 q21d        HISTORY OF PRESENTING ILLNESS: Alone. walking independently.  Randall Ali. 81 y.o.  male lung cancer-non-small cell stage IV [supra-renal nodule s/p biopsy] currently on Keytruda is for follow-up/ review the results of the recent CT scan chest.  Patient continues to feel poorly overall.  Complains of shortness of breath on minimal exertion.  Patient's last treatment was held because of ongoing shortness of breath.  Denies any tingling or numbness in the extremities. No nausea no vomiting no headaches.  Review of Systems  Constitutional:  Positive for malaise/fatigue. Negative for chills, diaphoresis, fever and weight loss.  HENT:  Negative for nosebleeds and sore throat.   Eyes:  Negative for double vision.  Respiratory:  Negative for cough, hemoptysis, sputum production, shortness of breath and wheezing.   Cardiovascular:  Negative for chest pain, palpitations, orthopnea and leg swelling.  Gastrointestinal:  Positive for nausea. Negative for abdominal pain, blood in stool, constipation, diarrhea, heartburn, melena and vomiting.  Genitourinary:  Negative for dysuria, frequency and urgency.  Musculoskeletal:  Positive for back pain and joint pain.  Skin: Negative.  Negative for itching and rash.  Neurological:  Negative for dizziness, tingling, focal weakness, weakness and headaches.  Endo/Heme/Allergies:  Does not bruise/bleed easily.  Psychiatric/Behavioral:  Negative for depression. The patient is not nervous/anxious and does not have insomnia.      MEDICAL HISTORY:  Past  Medical History:  Diagnosis Date   Allergy    seasonal   Arthritis    all over- in general    CAD (coronary artery disease)    a. inferior  wall MI 10/01 s/p PCI/DES to RCA; b. Myoview 3/16 neg for ischemia; c. LHC 8/16: ostLAD 80%, OM1 70%, OM2 70% x 2 lesions, mRCA 30%, dRCA 70% s/p 4-V CABG 01/24/15 (LIMA-LAD, VG- OM1, VG-OM2, VG-PDA)    Cancer (HCC)    skin, melanoma   Carotid artery disease ([place])    a. [place] 8/16: 1-39% bilateral ICA stenosis   Cataract    removed   Diastolic dysfunction    a. 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   stent RCA   Lung cancer ([place])    Postoperative wound infection 02/02/2015   Reflux esophagitis    Sleep apnea 2017   CPAP at night    SURGICAL HISTORY: Past Surgical History:  Procedure Laterality Date   arm surgery  2010   BROW LIFT Bilateral 11/25/2019   Procedure: BROW PTOSIS REPAIR BILATERAL;  Surgeon: Karle Starch, MD;  Location: [facility];  Service: Ophthalmology;  Laterality: Bilateral;  sleep apnea   CARDIAC CATHETERIZATION  06/24/2011   CARDIAC CATHETERIZATION N/A 01/18/2015   Procedure: Left Heart Cath with coronary angiography;  Surgeon: Minna Merritts, MD;  Location: [place] [facility];  Service: Cardiovascular;  Laterality: N/A;   CARDIAC CATHETERIZATION N/A 01/18/2015   Procedure: Intravascular Pressure Wire/FFR Study;  Surgeon: [name], MD;  Location: [place] [facility];  Service: Cardiovascular;  Laterality: N/A;   CAROTID STENT  03/10/2011   COLONOSCOPY  2010   COLONOSCOPY  06/14/2014   Dr Hilarie Fredrickson   CORONARY ARTERY BYPASS GRAFT N/A 01/24/2015   Procedure: CORONARY ARTERY BYPASS GRAFTING x 4 (LIMA-LAD, SVG-Int 1- Int 2, SVG-PD) ENDOSCOPIC GREATER SAPHENOUS VEIN HARVEST LEFT LEG;  Surgeon: [name], MD;  Location: [facility];  Service: Open Heart Surgery;  Laterality: N/A;    EMBOLECTOMY  06/15/2019   Procedure: EMBOLECTOMY;  Surgeon: Katha Cabal, MD;  Location: [facility];  Service: Vascular;;  right superficial femoral artery   ENDARTERECTOMY FEMORAL Right 06/15/2019   Procedure: ENDARTERECTOMY FEMORAL;  Surgeon: Katha Cabal, MD;  Location: [facility];  Service: Vascular;  Laterality: Right;  common femoral profunda femoris superficial femoral   ESOPHAGOGASTRODUODENOSCOPY (EGD) WITH PROPOFOL N/A 04/24/2016   Procedure: ESOPHAGOGASTRODUODENOSCOPY (EGD) WITH PROPOFOL;  Surgeon: Jerene Bears, MD;  Location: [facility];  Service: Gastroenterology;  Laterality: N/A;   EYE SURGERY     lasik 15 yrs. ago, cataracts removed - both eyes    HAMMER TOE SURGERY     right toe   INSERTION OF ILIAC STENT Right 06/15/2019   Procedure: INSERTION OF ILIAC STENT ( STENTING OF SFA/POP ARTERY );  Surgeon: Katha Cabal, MD;  Location: [facility];  Service: Vascular;  Laterality: Right;  angioplpasty and stent placement: right superficial femoral right tibiopopliteal trunk bilateral common iliac arteries   IR IMAGING GUIDED PORT INSERTION  11/09/2020   LEFT HEART CATH AND CORONARY ANGIOGRAPHY Left 06/10/2017   Procedure: LEFT HEART CATH AND CORONARY ANGIOGRAPHY;  Surgeon: Minna Merritts, MD;  Location:  [place] [facility];  Service: Cardiovascular;  Laterality: Left;   LOWER EXTREMITY ANGIOGRAPHY Left 01/04/2019   Procedure: LOWER EXTREMITY ANGIOGRAPHY;  Surgeon: Katha Cabal, MD;  Location: [place] [facility];  Service: Cardiovascular;  Laterality: Left;   LOWER EXTREMITY ANGIOGRAPHY Right 01/25/2019   Procedure: LOWER EXTREMITY ANGIOGRAPHY;  Surgeon: Katha Cabal, MD;  Location: [facility];  Service: Cardiovascular;  Laterality: Right;   LOWER EXTREMITY ANGIOGRAPHY Right 01/15/2021   LOWER EXTREMITY ANGIOGRAPHY and stent placement to R SFA and popliteal artery Delana Meyer, Dolores Lory, MD)   NASAL SINUS SURGERY  2008   septpolasty, bilateral  turbinate reduction   SHOULDER ARTHROSCOPY  2012   TEE WITHOUT CARDIOVERSION N/A 01/24/2015   Procedure: TRANSESOPHAGEAL ECHOCARDIOGRAM (TEE);  Surgeon: [name], MD;  Location: [facility];  Service: Open Heart Surgery;  Laterality: N/A;   TOE SURGERY  1994   UPPER GI ENDOSCOPY  07/2014, 04-24-16   Dr Raquel James   WRIST SURGERY  2011    SOCIAL HISTORY: Social History   Socioeconomic History   Marital status: Single    Spouse name: Not on file   Number of children: Not on file   Years of education: 12   Highest education level: Not on file  Occupational History   Occupation: retired    Comment: [employer]  Tobacco Use   Smoking status: Former    Packs/day: 2.50    Years: 40.00    Total pack years: 100.00    Types: Cigarettes    Quit date: 03/24/2000    Years since quitting: 21.8   Smokeless tobacco: Never  Vaping Use   Vaping Use: Never used  Substance and Sexual Activity   Alcohol use: Yes    Comment: occassionally   Drug use: No   Sexual activity: Yes  Other Topics Concern   Not on file  Social History Narrative   Singled; lives with son and dog    Occ: retired, back part time at [employer];    Activity: gym 4-5d/wk   Diet: good water, fruits/vegetables daily   Caffeine Use-yes      ------------------------------------       Car sales- retd; [employer]- retd; quit smoking 2001. Alcohol couple nights a week. Live in [place]. Daughter lives 10 mins.    Social Determinants of Health   Financial Resource Strain: Low Risk  (09/26/2021)   Overall Financial Resource Strain (CARDIA)    Difficulty of Paying Living Expenses: Not hard at all  Food Insecurity: No Food Insecurity (09/26/2021)   Hunger Vital Sign    Worried About Running Out of Food in the Last Year: Never true    Ran Out of Food in the Last Year: Never true  Transportation Needs: No Transportation Needs (09/26/2021)   PRAPARE - Hydrologist (Medical): No    Lack of  Transportation (Non-Medical): No  Physical Activity: Sufficiently Active (09/26/2021)   Exercise Vital Sign    Days of Exercise per Week: 5 days    Minutes of Exercise per Session: 30 min  Stress: No Stress Concern Present (09/26/2021)   [place]    Feeling of Stress : Only a little  Social Connections: Socially Isolated (09/26/2021)   Social Connection and Isolation Panel [NHANES]    Frequency of Communication with Friends and Family: Twice a week    Frequency of Social Gatherings with Friends and Family: Twice a week  Attends Religious Services: Never    Active Member of Clubs or Organizations: Not on file    Attends Club or Organization Meetings: Never    Marital Status: Never married  Intimate Partner Violence: Not At Risk (09/26/2021)   Humiliation, Afraid, Rape, and Kick questionnaire    Fear of Current or Ex-Partner: No    Emotionally Abused: No    Physically Abused: No    Sexually Abused: No    FAMILY HISTORY: Family History  Problem Relation Age of Onset   Hypertension Mother    Heart disease Mother    Hypertension Father    Diabetes Father    Lymphoma Sister    Heart disease Brother 26   Cancer Paternal Grandfather    Colon cancer Neg Hx    Prostate cancer Neg Hx    Bladder Cancer Neg Hx    Kidney cancer Neg Hx    Colon polyps Neg Hx    Esophageal cancer Neg Hx    Pancreatic cancer Neg Hx    Stomach cancer Neg Hx     ALLERGIES:  has No Known Allergies.  MEDICATIONS:  Current Outpatient Medications  Medication Sig Dispense Refill   acetaminophen (TYLENOL) 325 MG tablet Take 650 mg by mouth as needed for moderate pain or mild pain.     albuterol (VENTOLIN HFA) 108 (90 Base) MCG/ACT inhaler Inhale 2 puffs into the lungs every 6 (six) hours as needed for wheezing or shortness of breath. 18 g 2   Ascorbic Acid (VITAMIN C) 1000 MG tablet Take 1,000 mg by mouth daily.     aspirin 81 MG EC tablet  Take 81 mg by mouth daily.       Calcium-Magnesium-Vitamin D (CALCIUM 1200+D3 PO) Take 1 tablet by mouth daily.     Carboxymethylcellul-Glycerin (LUBRICATING EYE DROPS OP) Place 1 drop into both eyes daily as needed (irritation).     Cholecalciferol (VITAMIN D3) 50 MCG (2000 UT) TABS Take 2,000 Units by mouth daily.     Cyanocobalamin (B-12) 5000 MCG CAPS Take 5,000 mcg by mouth daily.     docusate sodium (COLACE) 100 MG capsule Take 200 mg by mouth at bedtime.     doxycycline (VIBRA-TABS) 100 MG tablet Take 1 tablet (100 mg total) by mouth 2 (two) times daily for 10 days. 20 tablet 0   finasteride (PROSCAR) 5 MG tablet Take 1 tablet (5 mg total) by mouth daily. 30 tablet 6   fluticasone (FLONASE) 50 MCG/ACT nasal spray Place into both nostrils.     lidocaine-prilocaine (EMLA) cream Apply 30 -45 mins prior to port access. 30 g 3   loratadine (CLARITIN) 10 MG tablet Take 10 mg by mouth daily.     Melatonin 10 MG CAPS Take 10 mg by mouth at bedtime as needed (sleep).     nitroGLYCERIN (NITROSTAT) 0.4 MG SL tablet Place 1 tablet (0.4 mg total) under the tongue every 5 (five) minutes as needed for up to 25 doses for chest pain. 25 tablet 1   olopatadine (PATANOL) 0.1 % ophthalmic solution Place 1 drop into both eyes 2 (two) times daily. 5 mL 12   pantoprazole (PROTONIX) 40 MG tablet TAKE 1 TABLET BY MOUTH DAILY 90 tablet 0   predniSONE (DELTASONE) 20 MG tablet Take 1 tablet (20 mg total) by mouth daily with breakfast. Take 3 pills once a day for 1 week; and then 2 pills once a day for 1 week; then 1 pill a day. 60 tablet 0   prochlorperazine (  COMPAZINE) 10 MG tablet Take 1 tablet (10 mg total) by mouth every 6 (six) hours as needed for nausea or vomiting. 40 tablet 1   simvastatin (ZOCOR) 40 MG tablet Take 1 tablet (40 mg total) by mouth at bedtime. 90 tablet 3   traMADol (ULTRAM) 50 MG tablet TAKE 1 TABLET BY MOUTH 3 TIMES DAILY AS NEEDED FOR MODERATE PAIN 90 tablet 0   valsartan (DIOVAN) 80 MG  tablet One daily 30 tablet 11   Vitamin E 450 MG (1000 UT) CAPS Take 450 Units by mouth daily.     Zinc 50 MG TABS Take 50 mg by mouth daily.     azithromycin (ZITHROMAX Z-PAK) 250 MG tablet Take 2 tablets together with food on day 1. Then take one tablet daily for days 2-5. (Patient not taking: Reported on 02/06/2022) 6 tablet 0   No current facility-administered medications for this visit.   Facility-Administered Medications Ordered in Other Visits  Medication Dose Route Frequency Provider Last Rate Last Admin   heparin lock flush 100 UNIT/ML injection               .  PHYSICAL EXAMINATION: ECOG PERFORMANCE STATUS: 0 - Asymptomatic  Vitals:   02/06/22 0800  BP: 132/62  Pulse: 75  Resp: 18  Temp: 97.8 F (36.6 C)  SpO2: 93%      Filed Weights   02/06/22 0800  Weight: 220 lb 6.4 oz (100 kg)        Physical Exam HENT:     Head: Normocephalic and atraumatic.     Mouth/Throat:     Pharynx: No oropharyngeal exudate.  Eyes:     Pupils: Pupils are equal, round, and reactive to light.  Cardiovascular:     Rate and Rhythm: Normal rate and regular rhythm.  Pulmonary:     Effort: No respiratory distress.     Breath sounds: No wheezing.     Comments: Decreased air entry bilaterally. Abdominal:     General: Bowel sounds are normal. There is no distension.     Palpations: Abdomen is soft. There is no mass.     Tenderness: There is no abdominal tenderness. There is no guarding or rebound.  Musculoskeletal:        General: No tenderness. Normal range of motion.     Cervical back: Normal range of motion and neck supple.  Skin:    General: Skin is warm.  Neurological:     Mental Status: He is alert and oriented to person, place, and time.  Psychiatric:        Mood and Affect: Affect normal.      LABORATORY DATA:  I have reviewed the data as listed Lab Results  Component Value Date   WBC 11.6 (H) 02/06/2022   HGB 14.8 02/06/2022   HCT 43.9 02/06/2022   MCV  91.6 02/06/2022   PLT 228 02/06/2022   Recent Labs    01/06/22 1242 01/14/22 0820 01/30/22 1246 02/06/22 0816  NA 137 136 137 137  K 3.9 4.1 4.3 3.6  CL 106 106 107 104  CO2 25 24 23 25   GLUCOSE 108* 103* 129* 88  BUN 18 24* 20 23  CREATININE 0.91 0.97 1.04 1.01  CALCIUM 8.9 9.0 8.9 9.3  GFRNONAA >60 >60 >60 >60  PROT 6.5  --  6.6 7.1  ALBUMIN 3.8  --  3.8 3.8  AST 16  --  18 21  ALT 16  --  16 18  ALKPHOS 54  --  61 61  BILITOT 0.3  --  0.6 0.8    RADIOGRAPHIC STUDIES: I have personally reviewed the radiological images as listed and agreed with the findings in the report. CT Angio Chest Pulmonary Embolism (PE) W or WO Contrast  Result Date: 01/31/2022 CLINICAL DATA:  81 year old male with history of positive D-dimer. Worsening shortness of breath for 1 month. Evaluate for pulmonary embolism. History of non-small cell lung cancer. * Tracking Code: BO * EXAM: CT ANGIOGRAPHY CHEST WITH CONTRAST TECHNIQUE: Multidetector CT imaging of the chest was performed using the standard protocol during bolus administration of intravenous contrast. Multiplanar CT image reconstructions and MIPs were obtained to evaluate the vascular anatomy. RADIATION DOSE REDUCTION: This exam was performed according to the departmental dose-optimization program which includes automated exposure control, adjustment of the mA and/or kV according to patient size and/or use of iterative reconstruction technique. CONTRAST:  22mL OMNIPAQUE IOHEXOL 350 MG/ML SOLN COMPARISON:  Chest CT 11/01/2021. FINDINGS: Cardiovascular: There are no filling defects within the pulmonary arterial tree to suggest pulmonary embolism. Heart size is borderline enlarged. There is no significant pericardial fluid, thickening or pericardial calcification. There is aortic atherosclerosis, as well as atherosclerosis of the great vessels of the mediastinum and the coronary arteries, including calcified atherosclerotic plaque in the left main, left  anterior descending, left circumflex and right coronary arteries. Status post median sternotomy for CABG including LIMA to the LAD. Right internal jugular single-lumen Port-A-Cath with tip terminating at the superior cavoatrial junction. Mediastinum/Nodes: No pathologically enlarged mediastinal or hilar lymph nodes. Esophagus is unremarkable in appearance. No axillary lymphadenopathy. Lungs/Pleura: Linear area of architectural distortion and some cylindrical bronchiectasis noted in the superior segment of the left lower lobe at the site of the treated neoplasm, stable compared to the recent prior examination. No other new suspicious appearing pulmonary nodules or masses are noted. No acute consolidative airspace disease. No pleural effusions. Widespread but patchy areas of ground-glass attenuation and septal thickening are noted throughout the lungs bilaterally, most evident in the mid to lower lungs. Upper Abdomen: Atherosclerotic calcifications in the abdominal aorta. Exophytic 1.4 cm low-attenuation lesion in the upper pole of the right kidney, incompletely characterized on today's noncontrast CT examination, but statistically likely small cysts (no imaging follow-up is recommended). Musculoskeletal: Median sternotomy wires. There are no aggressive appearing lytic or blastic lesions noted in the visualized portions of the skeleton. Review of the MIP images confirms the above findings. IMPRESSION: 1. No evidence of pulmonary embolism. 2. The treated lesion in the superior segment of the left lower lobe appears stable compared to the prior study. No new suspicious appearing pulmonary nodules or lymphadenopathy noted elsewhere in the thorax. 3. The appearance of the lungs suggests interstitial lung disease. Outpatient referral to Pulmonology for further clinical evaluation is recommended. Follow-up high-resolution chest CT should be considered if clinically appropriate. 4. Aortic atherosclerosis, in addition to left  main and three-vessel coronary artery disease. Status post median sternotomy for CABG including LIMA to the LAD. Aortic Atherosclerosis (ICD10-I70.0). Electronically Signed   By: Vinnie Langton M.D.   On: 01/31/2022 10:28   DG Chest 2 View  Result Date: 01/31/2022 CLINICAL DATA:  Short of breath EXAM: CHEST - 2 VIEW COMPARISON:  12/11/2021 FINDINGS: Right chest wall port catheter is unchanged. Progressive interstitial changes. No pleural effusion or pneumothorax. Stable cardiomediastinal contours. No acute osseous abnormality. IMPRESSION: Progressive interstitial changes may reflect edema or atypical/viral pneumonia. Electronically Signed   By: Macy Mis M.D.   On: 01/31/2022 09:45  ASSESSMENT & PLAN:   Cancer of lower lobe of left lung ([place]) # LLL- Lung cancer-non-small cell stage IV [right suprarenal nodule-s/p biopsy "non-small cell ca"].  S/p  CarboTaxol plus Keytruda x4; currently on single agent Keytruda-August 25 a CT scan-  No evidence of pulmonary embolism; The treated lesion in the superior segment of the left lower lobe appears stable compared to the prior study. No new suspicious appearing pulmonary nodules or lymphadenopathy noted elsewhere in the thorax; see discussion below regarding interstitial lung disease.  #Recommend holding Keytruda at this time given the absence of any progressive disease; also interstitial lung findings noted- ?  Related to [facility].  We will consider giving the patient a break from therapy/as there is no evidence of improvement in disease on imaging and also given patient's poor tolerance.  #Ongoing dyspnea/fatigue-August 2023-CT scan"Interstitial lung disease"-question related to [facility] versus others.  We will start the patient prednisone 60 mg a day tapered to 20 mg for the next 2 weeks.  Patient currently on Z-Pak.  Discussed with Dr. Mortimer Fries.  Also recommend the patient follow-up/call pulmonary office for further follow-up.  # Acute on chronic  back pain- BIL radiating lower extremitie- s/p evaluation [[place]]; JUNE 2023 MRI-no cancer; s/p injection.  on tramadol 50 mg BID prn.  on PT- STABLE.  # CAD-s/p CABG-/PVD-s/p stent [Dr.Schneir]-improvement in pain noted.  On Plavix --STABLE;  # IV access: Mediport functioning.  # DISPOSITION: # HOLD Keytruda today;  # follow up in 2 weeks with NP- labs- cbc/cmp- Dr.B  # I reviewed the blood work- with the patient in detail; also reviewed the imaging independently [as summarized above]; and with the patient in detail.           All  questions were answered. The patient knows to call the clinic with any problems, questions or concerns.    Cammie Sickle, MD 02/07/2022 12:51 AM

## 2022-02-06 NOTE — Assessment & Plan Note (Addendum)
#   [facility]- Lung cancer-non-small cell stage IV [right suprarenal nodule-s/p biopsy "non-small cell ca"].  S/p  CarboTaxol plus Keytruda x4; currently on single agent Keytruda-August 25 a CT scan-  No evidence of pulmonary embolism; The treated lesion in the superior segment of the left lower lobe appears stable compared to the prior study. No new suspicious appearing pulmonary nodules or lymphadenopathy noted elsewhere in the thorax; see discussion below regarding interstitial lung disease.  #Recommend holding Keytruda at this time given the absence of any progressive disease; also interstitial lung findings noted- ?  Related to [facility].  We will consider giving the patient a break from therapy/as there is no evidence of improvement in disease on imaging and also given patient's poor tolerance.  #Ongoing dyspnea/fatigue-August 2023-CT scan"Interstitial lung disease"-question related to [facility] versus others.  We will start the patient prednisone 60 mg a day tapered to 20 mg for the next 2 weeks.  Patient currently on Z-Pak.  Discussed with Dr. Mortimer Fries.  Also recommend the patient follow-up/call pulmonary office for further follow-up.  # Acute on chronic back pain- BIL radiating lower extremitie- s/p evaluation [[place]]; JUNE 2023 MRI-no cancer; s/p injection.  on tramadol 50 mg BID prn.  on PT- STABLE.  # CAD-s/p CABG-/PVD-s/p stent [Dr.Schneir]-improvement in pain noted.  On Plavix --STABLE;  # IV access: Mediport functioning.  # DISPOSITION: # HOLD Keytruda today;  # follow up in 2 weeks with NP- labs- cbc/cmp- Dr.B  # I reviewed the blood work- with the patient in detail; also reviewed the imaging independently [as summarized above]; and with the patient in detail.

## 2022-02-06 NOTE — Progress Notes (Signed)
Patient feels that his breathing is getting worse.  Was prescribed Prednisone and Doxycycline by Judson Roch and has 2 days of Prednisone and 4 more days on antibiotic.  Feels that his legs get weak with walking.

## 2022-02-07 ENCOUNTER — Encounter: Payer: Self-pay | Admitting: Internal Medicine

## 2022-02-10 ENCOUNTER — Telehealth: Payer: Self-pay | Admitting: Oncology

## 2022-02-10 ENCOUNTER — Encounter: Payer: Self-pay | Admitting: Family Medicine

## 2022-02-10 NOTE — Telephone Encounter (Signed)
Patient called and complained no energy, feels bad, body hurts. No fever chills, cough, nausea vomiting.  Current immunotherapy was held.  Recommend patient to have covid tested. If feels very weak, go to ER for evaluation.  Patient says he will get covid tested tomorrow and call office  Cc Dr.B to follow up tomorrow

## 2022-02-11 ENCOUNTER — Emergency Department: Payer: PPO

## 2022-02-11 ENCOUNTER — Inpatient Hospital Stay (HOSPITAL_BASED_OUTPATIENT_CLINIC_OR_DEPARTMENT_OTHER): Payer: PPO | Admitting: Hospice and Palliative Medicine

## 2022-02-11 ENCOUNTER — Telehealth: Payer: Self-pay | Admitting: *Deleted

## 2022-02-11 ENCOUNTER — Inpatient Hospital Stay
Admission: EM | Admit: 2022-02-11 | Discharge: 2022-02-14 | DRG: 689 | Disposition: A | Discharge status: Home Health | Payer: PPO | Attending: Internal Medicine | Admitting: Internal Medicine

## 2022-02-11 ENCOUNTER — Encounter: Payer: Self-pay | Admitting: Emergency Medicine

## 2022-02-11 ENCOUNTER — Inpatient Hospital Stay: Payer: PPO | Attending: Internal Medicine

## 2022-02-11 ENCOUNTER — Observation Stay: Payer: PPO

## 2022-02-11 ENCOUNTER — Telehealth: Payer: Self-pay | Admitting: Family Medicine

## 2022-02-11 ENCOUNTER — Other Ambulatory Visit: Payer: Self-pay | Admitting: *Deleted

## 2022-02-11 ENCOUNTER — Ambulatory Visit: Payer: PPO

## 2022-02-11 DIAGNOSIS — R509 Fever, unspecified: Secondary | ICD-10-CM

## 2022-02-11 DIAGNOSIS — Z7982 Long term (current) use of aspirin: Secondary | ICD-10-CM | POA: Diagnosis not present

## 2022-02-11 DIAGNOSIS — R7989 Other specified abnormal findings of blood chemistry: Secondary | ICD-10-CM

## 2022-02-11 DIAGNOSIS — R4182 Altered mental status, unspecified: Secondary | ICD-10-CM | POA: Insufficient documentation

## 2022-02-11 DIAGNOSIS — R300 Dysuria: Secondary | ICD-10-CM

## 2022-02-11 DIAGNOSIS — I10 Essential (primary) hypertension: Secondary | ICD-10-CM | POA: Diagnosis present

## 2022-02-11 DIAGNOSIS — Z85820 Personal history of malignant melanoma of skin: Secondary | ICD-10-CM | POA: Diagnosis not present

## 2022-02-11 DIAGNOSIS — M47816 Spondylosis without myelopathy or radiculopathy, lumbar region: Secondary | ICD-10-CM | POA: Diagnosis present

## 2022-02-11 DIAGNOSIS — Z951 Presence of aortocoronary bypass graft: Secondary | ICD-10-CM | POA: Diagnosis not present

## 2022-02-11 DIAGNOSIS — E871 Hypo-osmolality and hyponatremia: Secondary | ICD-10-CM | POA: Diagnosis not present

## 2022-02-11 DIAGNOSIS — Z79899 Other long term (current) drug therapy: Secondary | ICD-10-CM

## 2022-02-11 DIAGNOSIS — N390 Urinary tract infection, site not specified: Secondary | ICD-10-CM | POA: Diagnosis present

## 2022-02-11 DIAGNOSIS — Z6831 Body mass index (BMI) 31.0-31.9, adult: Secondary | ICD-10-CM

## 2022-02-11 DIAGNOSIS — E785 Hyperlipidemia, unspecified: Secondary | ICD-10-CM | POA: Diagnosis present

## 2022-02-11 DIAGNOSIS — I252 Old myocardial infarction: Secondary | ICD-10-CM | POA: Diagnosis not present

## 2022-02-11 DIAGNOSIS — R0981 Nasal congestion: Secondary | ICD-10-CM | POA: Diagnosis present

## 2022-02-11 DIAGNOSIS — J329 Chronic sinusitis, unspecified: Secondary | ICD-10-CM | POA: Diagnosis not present

## 2022-02-11 DIAGNOSIS — Z8249 Family history of ischemic heart disease and other diseases of the circulatory system: Secondary | ICD-10-CM

## 2022-02-11 DIAGNOSIS — Z87891 Personal history of nicotine dependence: Secondary | ICD-10-CM

## 2022-02-11 DIAGNOSIS — I739 Peripheral vascular disease, unspecified: Secondary | ICD-10-CM | POA: Diagnosis present

## 2022-02-11 DIAGNOSIS — E876 Hypokalemia: Secondary | ICD-10-CM | POA: Diagnosis present

## 2022-02-11 DIAGNOSIS — I708 Atherosclerosis of other arteries: Secondary | ICD-10-CM | POA: Diagnosis present

## 2022-02-11 DIAGNOSIS — Z9221 Personal history of antineoplastic chemotherapy: Secondary | ICD-10-CM | POA: Insufficient documentation

## 2022-02-11 DIAGNOSIS — J019 Acute sinusitis, unspecified: Secondary | ICD-10-CM | POA: Diagnosis present

## 2022-02-11 DIAGNOSIS — K219 Gastro-esophageal reflux disease without esophagitis: Secondary | ICD-10-CM | POA: Diagnosis present

## 2022-02-11 DIAGNOSIS — B964 Proteus (mirabilis) (morganii) as the cause of diseases classified elsewhere: Secondary | ICD-10-CM | POA: Diagnosis present

## 2022-02-11 DIAGNOSIS — R0902 Hypoxemia: Secondary | ICD-10-CM | POA: Diagnosis present

## 2022-02-11 DIAGNOSIS — C3432 Malignant neoplasm of lower lobe, left bronchus or lung: Secondary | ICD-10-CM | POA: Diagnosis present

## 2022-02-11 DIAGNOSIS — N400 Enlarged prostate without lower urinary tract symptoms: Secondary | ICD-10-CM | POA: Diagnosis present

## 2022-02-11 DIAGNOSIS — I2510 Atherosclerotic heart disease of native coronary artery without angina pectoris: Secondary | ICD-10-CM | POA: Diagnosis present

## 2022-02-11 DIAGNOSIS — R410 Disorientation, unspecified: Secondary | ICD-10-CM | POA: Diagnosis present

## 2022-02-11 DIAGNOSIS — Z20822 Contact with and (suspected) exposure to covid-19: Secondary | ICD-10-CM | POA: Diagnosis present

## 2022-02-11 DIAGNOSIS — B1009 Other human herpesvirus encephalitis: Secondary | ICD-10-CM | POA: Diagnosis present

## 2022-02-11 DIAGNOSIS — G4733 Obstructive sleep apnea (adult) (pediatric): Secondary | ICD-10-CM | POA: Diagnosis present

## 2022-02-11 DIAGNOSIS — E860 Dehydration: Secondary | ICD-10-CM | POA: Diagnosis present

## 2022-02-11 DIAGNOSIS — E669 Obesity, unspecified: Secondary | ICD-10-CM | POA: Diagnosis present

## 2022-02-11 DIAGNOSIS — K449 Diaphragmatic hernia without obstruction or gangrene: Secondary | ICD-10-CM | POA: Diagnosis not present

## 2022-02-11 DIAGNOSIS — N3000 Acute cystitis without hematuria: Secondary | ICD-10-CM | POA: Diagnosis not present

## 2022-02-11 DIAGNOSIS — G9341 Metabolic encephalopathy: Secondary | ICD-10-CM | POA: Diagnosis present

## 2022-02-11 DIAGNOSIS — E66811 Obesity, class 1: Secondary | ICD-10-CM | POA: Diagnosis present

## 2022-02-11 LAB — RESPIRATORY PANEL BY PCR

## 2022-02-11 LAB — URINALYSIS, COMPLETE (UACMP) WITH MICROSCOPIC
Bilirubin Urine: NEGATIVE
Glucose, UA: NEGATIVE mg/dL
Hgb urine dipstick: NEGATIVE
Ketones, ur: NEGATIVE mg/dL
Leukocytes,Ua: NEGATIVE
Nitrite: NEGATIVE
Protein, ur: NEGATIVE mg/dL
Specific Gravity, Urine: 1.005 (ref 1.005–1.030)
WBC, UA: NONE SEEN WBC/hpf (ref 0–5)
pH: 5 (ref 5.0–8.0)

## 2022-02-11 LAB — CBC WITH DIFFERENTIAL/PLATELET
Abs Immature Granulocytes: 0.2 10*3/uL — ABNORMAL HIGH (ref 0.00–0.07)
Basophils Absolute: 0.1 10*3/uL (ref 0.0–0.1)
Basophils Relative: 1 %
Eosinophils Absolute: 0 10*3/uL (ref 0.0–0.5)
Eosinophils Relative: 0 %
HCT: 42.1 % (ref 39.0–52.0)
Hemoglobin: 14.5 g/dL (ref 13.0–17.0)
Immature Granulocytes: 2 %
Lymphocytes Relative: 15 %
Lymphs Abs: 1.9 10*3/uL (ref 0.7–4.0)
MCH: 30.5 pg (ref 26.0–34.0)
MCHC: 34.4 g/dL (ref 30.0–36.0)
MCV: 88.6 fL (ref 80.0–100.0)
Monocytes Absolute: 2 10*3/uL — ABNORMAL HIGH (ref 0.1–1.0)
Monocytes Relative: 16 %
Neutro Abs: 7.9 10*3/uL — ABNORMAL HIGH (ref 1.7–7.7)
Neutrophils Relative %: 66 %
Platelets: 219 10*3/uL (ref 150–400)
RBC: 4.75 MIL/uL (ref 4.22–5.81)
RDW: 12.4 % (ref 11.5–15.5)
WBC: 12 10*3/uL — ABNORMAL HIGH (ref 4.0–10.5)
nRBC: 0 % (ref 0.0–0.2)

## 2022-02-11 LAB — LACTIC ACID, PLASMA
Lactic Acid, Venous: 1.4 mmol/L (ref 0.5–1.9)
Lactic Acid, Venous: 2 mmol/L (ref 0.5–1.9)

## 2022-02-11 LAB — COMPREHENSIVE METABOLIC PANEL
ALT: 16 U/L (ref 0–44)
AST: 17 U/L (ref 15–41)
Albumin: 3.7 g/dL (ref 3.5–5.0)
Alkaline Phosphatase: 57 U/L (ref 38–126)
Anion gap: 9 (ref 5–15)
BUN: 22 mg/dL (ref 8–23)
CO2: 24 mmol/L (ref 22–32)
Calcium: 8.8 mg/dL — ABNORMAL LOW (ref 8.9–10.3)
Chloride: 99 mmol/L (ref 98–111)
Creatinine, Ser: 1.2 mg/dL (ref 0.61–1.24)
GFR, Estimated: >60 mL/min (ref >60)
Glucose, Bld: 108 mg/dL — ABNORMAL HIGH (ref 70–99)
Potassium: 3.3 mmol/L — ABNORMAL LOW (ref 3.5–5.1)
Sodium: 132 mmol/L — ABNORMAL LOW (ref 135–145)
Total Bilirubin: 0.9 mg/dL (ref 0.3–1.2)
Total Protein: 6.5 g/dL (ref 6.5–8.1)

## 2022-02-11 LAB — BRAIN NATRIURETIC PEPTIDE: B Natriuretic Peptide: 468.4 pg/mL — ABNORMAL HIGH (ref 0.0–100.0)

## 2022-02-11 LAB — T4, FREE: Free T4: 0.86 ng/dL (ref 0.61–1.12)

## 2022-02-11 LAB — PROTIME-INR
INR: 1.1 (ref 0.8–1.2)
Prothrombin Time: 13.9 seconds (ref 11.4–15.2)

## 2022-02-11 LAB — BLOOD GAS, VENOUS
Acid-Base Excess: 3.1 mmol/L — ABNORMAL HIGH (ref 0.0–2.0)
Bicarbonate: 27 mmol/L (ref 20.0–28.0)
O2 Saturation: 58.3 %
Patient temperature: 37
pCO2, Ven: 38 mmHg — ABNORMAL LOW (ref 44–60)
pH, Ven: 7.46 — ABNORMAL HIGH (ref 7.25–7.43)
pO2, Ven: 34 mmHg (ref 32–45)

## 2022-02-11 LAB — RESP PANEL BY RT-PCR (FLU A&B, COVID) ARPGX2
Influenza A by PCR: NEGATIVE
Influenza B by PCR: NEGATIVE
SARS Coronavirus 2 by RT PCR: NEGATIVE

## 2022-02-11 LAB — TSH: TSH: 0.474 u[IU]/mL (ref 0.350–4.500)

## 2022-02-11 LAB — TROPONIN I (HIGH SENSITIVITY): Troponin I (High Sensitivity): 11 ng/L (ref <18)

## 2022-02-11 MED ORDER — DOCUSATE SODIUM 100 MG PO CAPS
200.0000 mg | ORAL_CAPSULE | Freq: Every day | ORAL | Status: DC
  Administered 2022-02-11 – 2022-02-13 (×3): 200 mg via ORAL
  Filled 2022-02-11 (×3): qty 2

## 2022-02-11 MED ORDER — OLOPATADINE HCL 0.1 % OP SOLN
1.0000 [drp] | Freq: Two times a day (BID) | OPHTHALMIC | Status: DC
  Administered 2022-02-12 – 2022-02-14 (×5): 1 [drp] via OPHTHALMIC
  Filled 2022-02-11: qty 5

## 2022-02-11 MED ORDER — ACETAMINOPHEN 325 MG PO TABS
650.0000 mg | ORAL_TABLET | ORAL | Status: DC | PRN
  Filled 2022-02-11: qty 2

## 2022-02-11 MED ORDER — MORPHINE SULFATE (PF) 2 MG/ML IV SOLN: 2.0000 mg | INTRAVENOUS | Status: DC | PRN

## 2022-02-11 MED ORDER — VITAMIN B-12 1000 MCG PO TABS
5000.0000 ug | ORAL_TABLET | Freq: Every day | ORAL | Status: DC
  Administered 2022-02-12 – 2022-02-14 (×3): 5000 ug via ORAL
  Filled 2022-02-11 (×3): qty 5

## 2022-02-11 MED ORDER — VITAMIN D 25 MCG (1000 UNIT) PO TABS
2000.0000 [IU] | ORAL_TABLET | Freq: Every day | ORAL | Status: DC
  Administered 2022-02-12 – 2022-02-14 (×3): 2000 [IU] via ORAL
  Filled 2022-02-11 (×3): qty 2

## 2022-02-11 MED ORDER — ASPIRIN 81 MG PO TBEC
81.0000 mg | DELAYED_RELEASE_TABLET | Freq: Every day | ORAL | Status: DC
  Administered 2022-02-12 – 2022-02-14 (×3): 81 mg via ORAL
  Filled 2022-02-11 (×3): qty 1

## 2022-02-11 MED ORDER — HEPARIN SODIUM (PORCINE) 5000 UNIT/ML IJ SOLN
5000.0000 [IU] | Freq: Three times a day (TID) | INTRAMUSCULAR | Status: DC
  Administered 2022-02-11 – 2022-02-13 (×5): 5000 [IU] via SUBCUTANEOUS
  Filled 2022-02-11 (×5): qty 1

## 2022-02-11 MED ORDER — IOHEXOL 350 MG/ML SOLN
100.0000 mL | Freq: Once | INTRAVENOUS | Status: AC | PRN
  Administered 2022-02-11: 100 mL via INTRAVENOUS

## 2022-02-11 MED ORDER — NITROGLYCERIN 0.4 MG SL SUBL: 0.4000 mg | SUBLINGUAL_TABLET | SUBLINGUAL | Status: DC | PRN

## 2022-02-11 MED ORDER — VITAMIN E 180 MG (400 UNIT) PO CAPS
400.0000 [IU] | ORAL_CAPSULE | Freq: Every day | ORAL | Status: DC
Start: 2022-02-12 — End: 2022-02-14
  Administered 2022-02-12 – 2022-02-14 (×3): 400 [IU] via ORAL
  Filled 2022-02-11 (×3): qty 1

## 2022-02-11 MED ORDER — HYDROCODONE-ACETAMINOPHEN 5-325 MG PO TABS
1.0000 | ORAL_TABLET | ORAL | Status: DC | PRN
  Administered 2022-02-11: 1 via ORAL
  Filled 2022-02-11: qty 1

## 2022-02-11 MED ORDER — LORATADINE 10 MG PO TABS
10.0000 mg | ORAL_TABLET | Freq: Every day | ORAL | Status: DC
  Administered 2022-02-12 – 2022-02-14 (×3): 10 mg via ORAL
  Filled 2022-02-11 (×3): qty 1

## 2022-02-11 MED ORDER — MELATONIN 5 MG PO TABS: 10.0000 mg | ORAL_TABLET | Freq: Every evening | ORAL | Status: DC | PRN

## 2022-02-11 MED ORDER — PANTOPRAZOLE SODIUM 40 MG PO TBEC
40.0000 mg | DELAYED_RELEASE_TABLET | Freq: Every day | ORAL | Status: DC
  Administered 2022-02-12 – 2022-02-14 (×3): 40 mg via ORAL
  Filled 2022-02-11 (×3): qty 1

## 2022-02-11 MED ORDER — PROCHLORPERAZINE MALEATE 10 MG PO TABS: 10.0000 mg | ORAL_TABLET | Freq: Four times a day (QID) | ORAL | Status: DC | PRN

## 2022-02-11 MED ORDER — ALBUTEROL SULFATE (2.5 MG/3ML) 0.083% IN NEBU: 2.5000 mg | INHALATION_SOLUTION | Freq: Four times a day (QID) | RESPIRATORY_TRACT | Status: DC | PRN

## 2022-02-11 MED ORDER — IOHEXOL 300 MG/ML  SOLN
100.0000 mL | Freq: Once | INTRAMUSCULAR | Status: AC | PRN
  Administered 2022-02-11: 75 mL via INTRAVENOUS

## 2022-02-11 MED ORDER — PREDNISONE 20 MG PO TABS
20.0000 mg | ORAL_TABLET | Freq: Every day | ORAL | Status: DC
  Administered 2022-02-12 – 2022-02-14 (×3): 20 mg via ORAL
  Filled 2022-02-11 (×3): qty 1

## 2022-02-11 MED ORDER — ZINC 50 MG PO TABS: 50.0000 mg | ORAL_TABLET | Freq: Every day | ORAL | Status: DC

## 2022-02-11 MED ORDER — VITAMIN C 500 MG PO TABS
1000.0000 mg | ORAL_TABLET | Freq: Every day | ORAL | Status: DC
  Administered 2022-02-12 – 2022-02-14 (×3): 1000 mg via ORAL
  Filled 2022-02-11 (×3): qty 2

## 2022-02-11 MED ORDER — SODIUM CHLORIDE 0.9% FLUSH
3.0000 mL | Freq: Two times a day (BID) | INTRAVENOUS | Status: DC
  Administered 2022-02-11 – 2022-02-14 (×5): 3 mL via INTRAVENOUS

## 2022-02-11 MED ORDER — SODIUM CHLORIDE 0.9 % IV SOLN: INTRAVENOUS | Status: AC

## 2022-02-11 MED ORDER — ACETAMINOPHEN 325 MG PO TABS: 650.0000 mg | ORAL_TABLET | ORAL | Status: DC | PRN

## 2022-02-11 MED ORDER — FINASTERIDE 5 MG PO TABS: 5.0000 mg | ORAL_TABLET | Freq: Every day | ORAL | Status: DC

## 2022-02-11 MED ORDER — FLUTICASONE PROPIONATE 50 MCG/ACT NA SUSP: 1.0000 | Freq: Every day | NASAL | Status: DC | PRN

## 2022-02-11 MED ORDER — IRBESARTAN 150 MG PO TABS
75.0000 mg | ORAL_TABLET | Freq: Every day | ORAL | Status: DC
  Administered 2022-02-12 – 2022-02-14 (×3): 75 mg via ORAL
  Filled 2022-02-11 (×4): qty 1

## 2022-02-11 MED ORDER — SIMVASTATIN 20 MG PO TABS
40.0000 mg | ORAL_TABLET | Freq: Every day | ORAL | Status: DC
  Administered 2022-02-11 – 2022-02-13 (×3): 40 mg via ORAL
  Filled 2022-02-11 (×3): qty 2

## 2022-02-11 NOTE — Telephone Encounter (Signed)
Spoke with Merrily Pew- labs added - cbc/metc.  Called daughter, Molena. She can have pt here by 1245 for port lab/ smc/ possible infusion

## 2022-02-11 NOTE — ED Notes (Signed)
Patient transported to MRI 

## 2022-02-11 NOTE — ED Triage Notes (Signed)
Altered mental status for couple days.  Sent from cancer center.  Family says he has had pneumonia and they have been treating at home.  Now he is altered and he says he feels bad.  He knows how old he is, but he does not know year or president.  He is alert.  Skin warm and dry.  His port is accessed --today by cancer center [facility].

## 2022-02-11 NOTE — ED Provider Notes (Signed)
[facility] Provider Note    Event Date/Time   First MD Initiated Contact with Patient 02/11/22 1546     (approximate)   History   Altered Mental Status and Shortness of Breath   HPI  Randall Heidelberg. is a 81 y.o. male who on review of note today by Altha Harm has a history of coronary disease CABG peripheral vascular disease stage IV non-small cell lung cancer currently on Keytruda for maintenance   Now having 48 hours of confusion and also fever 100.6 with labs with hyponatremia and hypokalemia today recently treated with azithromycin.  Per the patient he just feels confused.  He cannot describe it real well he just does not quite feel himself.  Very slight headache, reports a slight sore throat.  Family reports for about 1 week he has told him he does not quite feel himself and for the last 48 hours he had confusion like not recognizing people he normally would, not knowing how to find the normal TV stations he would watch, or recognizing how to get to his favorite restaurant  No slurred speech no falls no obvious deficits or facial droop  Spends considerable time outdoors but no known tick bites or rashes.  No myalgias.  Family reports about 2 weeks ago he was treated for pneumonia with azithromycin.   Physical Exam   Triage Vital Signs: ED Triage Vitals  Enc Vitals Group     BP --      Pulse Rate 02/11/22 1505 73     Resp 02/11/22 1505 20     Temp 02/11/22 1505 97.8 F (36.6 C)     Temp Source 02/11/22 1505 Oral     SpO2 02/11/22 1505 (!) 89 %     Weight 02/11/22 1506 210 lb (95.3 kg)     Height --      Head Circumference --      Peak Flow --      Pain Score 02/11/22 1506 0     Pain Loc --      Pain Edu? --      Excl. in [place]? --     Most recent vital signs: Vitals:   02/11/22 2157 02/11/22 2311  BP: (!) 137/56 (!) 145/78  Pulse: 77 79  Resp: 18 20  Temp: 99.1 F (37.3 C) 99.4 F (37.4 C)  SpO2: 97% 94%      General: Awake, no distress.  He is oriented to being in a hospital recognizes children but reports that he just cannot remember what year it is. CV:  Good peripheral perfusion.  Normal heart tones clear lungs.  Access Port-A-Cath in the right upper chest with clean dry and intact surround. Resp:  Normal effort.  Clear bilateral.  Very mild oxygen deficit on room air, corrects on 2 L Abd:  No distention.  Soft nontender nondistended Other:  No lower extremity edema.  Small skin tear over the inner left ankle which is clean without surrounding erythema, bandage applied.  Left forearm also with similar small skin tears but no evidence of superinfection to noted.  They appear to be healing.  No meningismus.  No photophobia.  Reports a very mild bifrontal headache at this time, but does not have clear symptoms to show meningeal irritation.   ED Results / Procedures / Treatments   Labs (all labs ordered are listed, but only abnormal results are displayed) Labs Reviewed  LACTIC ACID, PLASMA - Abnormal; Notable for the following components:  Result Value   Lactic Acid, Venous 2.0 (*)    All other components within normal limits  BLOOD GAS, VENOUS - Abnormal; Notable for the following components:   pH, Ven 7.46 (*)    pCO2, Ven 38 (*)    Acid-Base Excess 3.1 (*)    All other components within normal limits  BRAIN NATRIURETIC PEPTIDE - Abnormal; Notable for the following components:   B Natriuretic Peptide 468.4 (*)    All other components within normal limits  HEMOGLOBIN A1C - Abnormal; Notable for the following components:   Hgb A1c MFr Bld 5.8 (*)    All other components within normal limits  RESP PANEL BY RT-PCR (FLU A&B, COVID) [id number]  RESPIRATORY PANEL BY PCR  CULTURE, BLOOD (ROUTINE X 2)  CULTURE, BLOOD (ROUTINE X 2)  LACTIC ACID, PLASMA  PROTIME-INR  T4, FREE  TSH  COMPREHENSIVE METABOLIC PANEL  CBC  VITAMIN B12  TROPONIN I (HIGH SENSITIVITY)  TROPONIN I (HIGH  SENSITIVITY)   RADIOLOGY  Chest x-ray reviewed negative for obvious acute new pulmonary finding  CT head ordered to evaluate for possible acute pathology, evaluate for intracranial hemorrhage, small stroke, and also contrast requested to evaluate for possible metastatic disease Smet etc.  PROCEDURES:  Critical Care performed: No  Procedures      IMPRESSION / MDM / ASSESSMENT AND PLAN / ED COURSE  I reviewed the triage vital signs and the nursing notes.                              Differential diagnosis includes, but is not limited to, encephalopathy, stroke, acute metabolic or toxic abnormality, infection etc.  I suspect his symptoms are driven by infection as he had a low-grade fever at the cancer center of 100.6 but he is afebrile here on triage check and also on mine with a core temp of 98.1.  He is alert and oriented but not to location.  He is very pleasant and follows commands.  He is not obviously delirious.  Work-up through the cancer center thus far revealing for mild hypokalemia and mild hyponatremia but await chest x-ray and CT scan of the head.  Reviewed labs drawn today at the cancer center notable for mild hyponatremia and hypokalemia.  Normal creatinine.  Slight hyponatremia calcemic.  Very mild leukocytosis.  Urinalysis appears quite clear except for very rare number of bacteria.  This was sent for culture from the cancer center  Patient's presentation is most consistent with acute complicated illness / injury requiring diagnostic workup.  The patient is on the cardiac monitor to evaluate for evidence of arrhythmia and/or significant heart rate changes.  Clinical Course as of 02/12/22 0018  Tue Feb 11, 2022  1714 X-ray reviewed by me, no obvious focal infiltrates, but there are some interstitial abnormalities.  Radiologist reviewed and made comparison to previous film and advises no noted changes from previous x-ray.  No obvious new infiltrate or evidence to support  bronchopneumonia denoted on my interpretation the x-ray today [MQ]    Clinical Course User Index [MQ] Delman Kitten, MD   Admission consultation discussed with Dr. Posey Pronto, after discussion patient will be admitted for further care management given concerns of new onset of confusion with report of mild low-grade fever.  Labs interpreted as normal urinalysis.  Very slight leukocytosis  FINAL CLINICAL IMPRESSION(S) / ED DIAGNOSES   Final diagnoses:  Altered mental status, unspecified altered mental status type  Rx / DC Orders   ED Discharge Orders     None        Note:  This document was prepared using Dragon voice recognition software and may include unintentional dictation errors.   Delman Kitten, MD 02/12/22 [phone]

## 2022-02-11 NOTE — Assessment & Plan Note (Addendum)
?   If this is related to his chronic illness vs acute upper respiratory infection. No evidence of infection so far.  Nothing on CT head or MRI brain to explain this.   --EEG showed moderate diffuse encephalopathy, non-specific --? If side effect of tramadol contributed per family

## 2022-02-11 NOTE — Telephone Encounter (Signed)
Daughter [name] called reporting that patient is having confusion since Sunday, He cannot recall names of family members, he cannot figure which channels on TV the programs he watches regularly is on and things such as that. He tells her he just doesn't feel good. He is eating and drinking well He is short of breath just sitting there, breathing very hard. She states that he was recently diagnosed with pneumonia. He denies taking any medications that he should not be taking. She states that she is available to bring him in to be seen if we need to see him in office. Please advise

## 2022-02-11 NOTE — Assessment & Plan Note (Signed)
CPAP per home settings per respiratory therapist.

## 2022-02-11 NOTE — Assessment & Plan Note (Signed)
Pt has sore throat, some congestion which is mildly improved since Z-pack. Still on prednisone prescribed outpatient --Continue prednisone, Flonase --Monitor

## 2022-02-11 NOTE — Telephone Encounter (Signed)
Noted  

## 2022-02-11 NOTE — Telephone Encounter (Signed)
Lvm returning pt's daughter's, [name] (on dpr) call.  Need to get more details concerning message and pt's sxs.  May need OV.

## 2022-02-11 NOTE — Assessment & Plan Note (Signed)
Vitals:   02/11/22 1545 02/11/22 1600 02/11/22 1630 02/11/22 1700  BP: 133/72 138/79 (!) 151/72 (!) 143/82   02/11/22 1730 02/11/22 1929 02/11/22 1930 02/11/22 2000  BP: (!) 153/86 (!) 143/81 138/73 (!) 146/85   02/11/22 2045 02/11/22 2157  BP: (!) 156/84 (!) 137/56  Will continue patient on ACE or ARB substitute per pharmacy.

## 2022-02-11 NOTE — Progress Notes (Signed)
Confusion since Sunday. Gait appears slightly unsteady. Daughter states pt has completed abt for pneumonia. Wheezing noted. T-max 100.6.

## 2022-02-11 NOTE — Hospital Course (Addendum)
Brought by family for confusion. Has lung cancer and is on keytruda. Sees oncology and was found to have fever 100.6 Head ct is ok. Chest xray ILD pattern. Pt is hypoxic here Pt is on 2 L. No chest pain.  Urine clean.  Hyponatremia and hypokalemia.  Isolated fever.  Afebrile here.  Pt is awake but disoriented.  Daughter at bedside.  Pt is oriented to location  and this is completely new per daughter.[name] [phone]. Pt reported hs throat hurting x since yesterday.  Pt is ambulatory and denies any other complaints. Pt could not figure out which channel to turn on tv.

## 2022-02-11 NOTE — Assessment & Plan Note (Addendum)
Pt f/u with oncology. Dr Renae Fickle.  Asymptomatic. CT shows abnormal findings but stable. IMPRESSION: No definite evidence of pulmonary embolus.  Small sliding-type hiatal hernia.  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  Stable treated lesion is again noted in superior segment of left lower lobe.  Status post coronary bypass graft.  Aortic Atherosclerosis (ICD10-I70.0).

## 2022-02-11 NOTE — Telephone Encounter (Signed)
Patient daughter called and stated he was confused, can't remember people or numbers on tv remote and me may be having these symptoms due to tramadol or tylenol that was taken at the same time.

## 2022-02-11 NOTE — H&P (Signed)
History and Physical    Randall Ali. OEU:[id number] DOB: 23-Oct-1940 DOA: 02/11/2022  PCP: Ria Bush, MD    Patient coming from:  Home   Chief Complaint:  Confusion   HPI:  Randall Ali. is a 81 y.o. male seen in ed for confusion. Patient's daughter called patient's primary care and chart.  She states concern of tramadol causing confusion.  Patient's confusion is noted since Sunday about 2 days, with difficulty recalling names and difficulty remembering his TV channels. Chart review shows that patient sees oncology for his lung cancer gets Keytruda.  Patient was also treated for steroid taper and azithromycin.  Brought by family for confusion. Has lung cancer and is on keytruda. Sees oncology and was found to have fever 100.6 Head ct is ok. Chest xray ILD pattern. Pt is hypoxic here Pt is on 2 L. No chest pain.  Urine clean.  Hyponatremia and hypokalemia.  Isolated fever.  Afebrile here.  Pt is awake but disoriented.  Daughter at bedside.  Pt is oriented to location  and this is completely new per daughter.Randall Ali [phone]. Pt reported hs throat hurting x since yesterday.  Pt is ambulatory and denies any other complaints. Pt could not figure out which channel to turn on tv.  Pt has been started on Saturday for his sinus issue. CT Mri both negative for any acute abnormality.  2 D Echo 05/2021:  1. Left ventricular ejection fraction, by estimation, is 50 to 55%. The  left ventricle has low normal function. The left ventricle has no regional  wall motion abnormalities. Left ventricular diastolic parameters are  consistent with Grade I diastolic  dysfunction (impaired relaxation). The average left ventricular global  longitudinal strain is -16.8 %.   2. Right ventricular systolic function is normal. The right ventricular  size is normal.   3. Right atrial size was mildly dilated.   4. The mitral valve is normal in structure. No evidence  of mitral valve  regurgitation.   5. The aortic valve is calcified. Aortic valve regurgitation is not  visualized. Aortic valve sclerosis is present, with no evidence of aortic  valve stenosis.   6. The inferior vena cava is normal in size with greater than 50%  respiratory variability, suggesting right atrial pressure of 3 mmHg.   Comparison(s): Previous Echo showed LV EF 55-60%, no RWMA, MAC and mild  MR.   Pt has past medical history of: Past Medical History:  Diagnosis Date   Allergy    seasonal   Arthritis    all over- in general    CAD (coronary artery disease)    a. inferior wall MI 10/01 s/p PCI/DES to RCA; b. Myoview 3/16 neg for ischemia; c. LHC 8/16: ostLAD 80%, OM1 70%, OM2 70% x 2 lesions, mRCA 30%, dRCA 70% s/p 4-V CABG 01/24/15 (LIMA-LAD, VG- OM1, VG-OM2, VG-PDA)    Cancer (HCC)    skin, melanoma   Carotid artery disease ([place])    a. [place] 8/16: 1-39% bilateral ICA stenosis   Cataract    removed   Diastolic dysfunction    a. 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   stent RCA   Lung cancer ([place])  Postoperative wound infection 02/02/2015   Reflux esophagitis    Sleep apnea 2017   CPAP at night    Past Surgical History:  Procedure Laterality Date   arm surgery  2010   BROW LIFT Bilateral 11/25/2019   Procedure: BROW PTOSIS REPAIR BILATERAL;  Surgeon: Karle Starch, MD;  Location: [place] [facility];  Service: Ophthalmology;  Laterality: Bilateral;  sleep apnea   CARDIAC CATHETERIZATION  06/24/2011   CARDIAC CATHETERIZATION N/A 01/18/2015   Procedure: Left Heart Cath with coronary angiography;  Surgeon: Minna Merritts, MD;  Location: [place] [facility];  Service: Cardiovascular;  Laterality:  N/A;   CARDIAC CATHETERIZATION N/A 01/18/2015   Procedure: Intravascular Pressure Wire/FFR Study;  Surgeon: [name], MD;  Location: [place] [facility];  Service: Cardiovascular;  Laterality: N/A;   CAROTID STENT  03/10/2011   COLONOSCOPY  2010   COLONOSCOPY  06/14/2014   Dr Hilarie Fredrickson   COLONOSCOPY  01/2022   done for colon thickening at splenic flexure - 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[name])   CORONARY ARTERY BYPASS GRAFT N/A 01/24/2015   Procedure: CORONARY ARTERY BYPASS GRAFTING x 4 (LIMA-LAD, SVG-Int 1- Int 2, SVG-PD) ENDOSCOPIC GREATER SAPHENOUS VEIN HARVEST LEFT LEG;  Surgeon: [name], MD;  Location: [facility];  Service: Open Heart Surgery;  Laterality: N/A;   EMBOLECTOMY  06/15/2019   Procedure: EMBOLECTOMY;  Surgeon: Katha Cabal, MD;  Location: [facility];  Service: Vascular;;  right superficial femoral artery   ENDARTERECTOMY FEMORAL Right 06/15/2019   Procedure: ENDARTERECTOMY FEMORAL;  Surgeon: Katha Cabal, MD;  Location: [facility];  Service: Vascular;  Laterality: Right;  common femoral profunda femoris superficial femoral   ESOPHAGOGASTRODUODENOSCOPY (EGD) WITH PROPOFOL N/A 04/24/2016   Procedure: ESOPHAGOGASTRODUODENOSCOPY (EGD) WITH PROPOFOL;  Surgeon: Jerene Bears, MD;  Location: [facility];  Service: Gastroenterology;  Laterality: N/A;   EYE SURGERY     lasik 15 yrs. ago, cataracts removed - both eyes    HAMMER TOE SURGERY     right toe   INSERTION OF ILIAC STENT Right 06/15/2019   Procedure: INSERTION OF ILIAC STENT ( STENTING OF SFA/POP ARTERY );  Surgeon: Katha Cabal, MD;  Location: [facility];  Service: Vascular;  Laterality: Right;  angioplpasty and stent placement: right superficial femoral right tibiopopliteal trunk bilateral common iliac arteries   IR IMAGING GUIDED PORT INSERTION  11/09/2020   LEFT HEART CATH AND CORONARY ANGIOGRAPHY Left 06/10/2017   Procedure: LEFT HEART CATH AND CORONARY ANGIOGRAPHY;  Surgeon:  Minna Merritts, MD;  Location: [place] [facility];  Service: Cardiovascular;  Laterality: Left;   LOWER EXTREMITY ANGIOGRAPHY Left 01/04/2019   Procedure: LOWER EXTREMITY ANGIOGRAPHY;  Surgeon: Katha Cabal, MD;  Location: [place] [facility];  Service: Cardiovascular;  Laterality: Left;   LOWER EXTREMITY ANGIOGRAPHY Right 01/25/2019   Procedure: LOWER EXTREMITY ANGIOGRAPHY;  Surgeon: Katha Cabal, MD;  Location: [place] [facility];  Service: Cardiovascular;  Laterality: Right;   LOWER EXTREMITY ANGIOGRAPHY Right 01/15/2021   LOWER EXTREMITY ANGIOGRAPHY and stent placement to R SFA and popliteal artery Delana Meyer, Dolores Lory, MD)   NASAL SINUS SURGERY  2008   septpolasty, bilateral turbinate reduction   SHOULDER ARTHROSCOPY  2012   TEE WITHOUT CARDIOVERSION N/A 01/24/2015   Procedure: TRANSESOPHAGEAL ECHOCARDIOGRAM (TEE);  Surgeon: [name], MD;  Location: [place];  Service: Open Heart Surgery;  Laterality: N/A;   TOE SURGERY  1994   UPPER GI ENDOSCOPY  07/2014, 04-24-16  Dr Raquel James   WRIST SURGERY  2011     reports that he quit smoking about 21 years ago. His smoking use included cigarettes. He has a 100.00 pack-year smoking history. He has never used smokeless tobacco. He reports current alcohol use. He reports that he does not use drugs.  No Known Allergies  Family History  Problem Relation Age of Onset   Hypertension Mother    Heart disease Mother    Hypertension Father    Diabetes Father    Lymphoma Sister    Heart disease Brother 72   Cancer Paternal Grandfather    Colon cancer Neg Hx    Prostate cancer Neg Hx    Bladder Cancer Neg Hx    Kidney cancer Neg Hx    Colon polyps Neg Hx    Esophageal cancer Neg Hx    Pancreatic cancer Neg Hx    Stomach cancer Neg Hx     Prior to Admission medications   Medication Sig Start Date End Date Taking? Authorizing Provider  acetaminophen (TYLENOL) 325 MG tablet Take 650 mg by mouth as needed for moderate  pain or mild pain.    [provider]  albuterol (VENTOLIN HFA) 108 (90 Base) MCG/ACT inhaler Inhale 2 puffs into the lungs every 6 (six) hours as needed for wheezing or shortness of breath. 01/03/22   Tanda Rockers, MD  Ascorbic Acid (VITAMIN C) 1000 MG tablet Take 1,000 mg by mouth daily.    [provider]  aspirin 81 MG EC tablet Take 81 mg by mouth daily.      [provider]  Calcium-Magnesium-Vitamin D (CALCIUM 1200+D3 PO) Take 1 tablet by mouth daily.    [provider]  Carboxymethylcellul-Glycerin (LUBRICATING EYE DROPS OP) Place 1 drop into both eyes daily as needed (irritation).    [provider]  Cholecalciferol (VITAMIN D3) 50 MCG (2000 UT) TABS Take 2,000 Units by mouth daily.    [provider]  Cyanocobalamin (B-12) 5000 MCG CAPS Take 5,000 mcg by mouth daily.    [provider]  docusate sodium (COLACE) 100 MG capsule Take 200 mg by mouth at bedtime.    [provider]  finasteride (PROSCAR) 5 MG tablet Take 1 tablet (5 mg total) by mouth daily. 01/10/22   Ria Bush, MD  fluticasone [facility]) 50 MCG/ACT nasal spray Place into both nostrils. 08/16/21   [provider]  lidocaine-prilocaine (EMLA) cream Apply 30 -45 mins prior to port access. 04/18/21   Cammie Sickle, MD  loratadine (CLARITIN) 10 MG tablet Take 10 mg by mouth daily.    [provider]  Melatonin 10 MG CAPS Take 10 mg by mouth at bedtime as needed (sleep).    [provider]  nitroGLYCERIN (NITROSTAT) 0.4 MG SL tablet Place 1 tablet (0.4 mg total) under the tongue every 5 (five) minutes as needed for up to 25 doses for chest pain. 12/24/20   Minna Merritts, MD  olopatadine (PATANOL) 0.1 % ophthalmic solution Place 1 drop into both eyes 2 (two) times daily. 02/06/22   Cammie Sickle, MD  pantoprazole (PROTONIX) 40 MG tablet TAKE 1 TABLET BY MOUTH DAILY 01/01/22   Ria Bush, MD  predniSONE  (DELTASONE) 20 MG tablet Take 1 tablet (20 mg total) by mouth daily with breakfast. Take 3 pills once a day for 1 week; and then 2 pills once a day for 1 week; then 1 pill a day. 02/06/22   Cammie Sickle, MD  prochlorperazine (COMPAZINE) 10 MG tablet Take 1 tablet (10 mg total) by mouth every 6 (six) hours as needed for nausea or vomiting. 11/02/20   Cammie Sickle, MD  simvastatin (ZOCOR) 40 MG tablet Take 1 tablet (40 mg total) by mouth at bedtime. 10/21/21   Minna Merritts, MD  traMADol (ULTRAM) 50 MG tablet TAKE 1 TABLET BY MOUTH 3 TIMES DAILY AS NEEDED FOR MODERATE PAIN 01/13/22   Ria Bush, MD  valsartan (DIOVAN) 80 MG tablet One daily 10/31/21   Tanda Rockers, MD  Vitamin E 450 MG (1000 UT) CAPS Take 450 Units by mouth daily.    [provider]  Zinc 50 MG TABS Take 50 mg by mouth daily.    [provider]    Review of Systems:  Review of Systems  Unable to perform ROS: Mental status change  Neurological:        Confusion.      ED Course:   > Vitals:   02/11/22 1930 02/11/22 2000 02/11/22 2045 02/11/22 2157  BP: 138/73 (!) 146/85 (!) 156/84 (!) 137/56  Pulse: 81 73 78 77  Resp:   20 18  Temp:    99.1 F (37.3 C)  TempSrc:      SpO2: 95% 92% 93% 97%  Weight:       > Vitals:   02/11/22 1545 02/11/22 1600 02/11/22 1630 02/11/22 1700  BP: 133/72 138/79 (!) 151/72 (!) 143/82   02/11/22 1730 02/11/22 1929 02/11/22 1930 02/11/22 2000  BP: (!) 153/86 (!) 143/81 138/73 (!) 146/85   02/11/22 2045 02/11/22 2157  BP: (!) 156/84 (!) 137/56    >No intake/output data recorded. >No intake/output data recorded. >SpO2: 97 % O2 Flow Rate (L/min): 2 L/min  In ed pt is alert, and awake and unable to recall year/ and president.  Labs shows lactic acid elevation.  Hypokalemia/ elevated BNP.  Leucocytosis.    Results for orders placed or performed during the hospital encounter of 02/11/22 (from the past 24 hour(s))  Lactic acid, plasma      Status: None   Collection Time: 02/11/22  3:36 PM  Result Value Ref Range   Lactic Acid, Venous 1.4 0.5 - 1.9 mmol/L  Protime-INR     Status: None   Collection Time: 02/11/22  3:36 PM  Result Value Ref Range   Prothrombin Time 13.9 11.4 - 15.2 seconds   INR 1.1 0.8 - 1.2  Resp Panel by RT-PCR (Flu A&B, Covid) Anterior Nasal Swab     Status: None   Collection Time: 02/11/22  4:29 PM   Specimen: Anterior Nasal Swab  Result Value Ref Range   SARS Coronavirus 2 by RT PCR NEGATIVE NEGATIVE   Influenza A by PCR NEGATIVE NEGATIVE   Influenza B by PCR NEGATIVE NEGATIVE  Blood gas, venous     Status: Abnormal   Collection Time: 02/11/22  6:45 PM  Result Value Ref Range   pH, Ven 7.46 (H) 7.25 - 7.43   pCO2, Ven 38 (L) 44 - 60 mmHg   pO2, Ven 34 32 - 45 mmHg   Bicarbonate 27.0 20.0 - 28.0 mmol/L   Acid-Base Excess 3.1 (H) 0.0 - 2.0 mmol/L   O2 Saturation 58.3 %   Patient temperature 37.0    Collection site VEIN   Brain natriuretic peptide     Status: Abnormal   Collection Time: 02/11/22  6:45 PM  Result Value Ref Range   B Natriuretic Peptide 468.4 (H) 0.0 - 100.0  pg/mL  Troponin I (High Sensitivity)     Status: None   Collection Time: 02/11/22  6:45 PM  Result Value Ref Range   Troponin I (High Sensitivity) 11 <18 ng/L  Lactic acid, plasma     Status: Abnormal   Collection Time: 02/11/22  7:24 PM  Result Value Ref Range   Lactic Acid, Venous 2.0 (HH) 0.5 - 1.9 mmol/L  T4, free     Status: None   Collection Time: 02/11/22  7:24 PM  Result Value Ref Range   Free T4 0.86 0.61 - 1.12 ng/dL  TSH     Status: None   Collection Time: 02/11/22  7:24 PM  Result Value Ref Range   TSH 0.474 0.350 - 4.500 uIU/mL   *Note: Due to a large number of results and/or encounters for the requested time period, some results have not been displayed. A complete set of results can be found in Results Review.   Urine analysis:    Component Value Date/Time   COLORURINE STRAW (A) 02/11/2022 1348    APPEARANCEUR CLEAR (A) 02/11/2022 1348   LABSPEC 1.005 02/11/2022 1348   PHURINE 5.0 02/11/2022 1348   GLUCOSEU NEGATIVE 02/11/2022 1348   GLUCOSEU NEGATIVE 04/13/2015 1001   HGBUR NEGATIVE 02/11/2022 [place] 02/11/2022 1348   BILIRUBINUR negative 11/11/2021 1411   KETONESUR NEGATIVE 02/11/2022 1348   PROTEINUR NEGATIVE 02/11/2022 1348   UROBILINOGEN 0.2 11/11/2021 1411   UROBILINOGEN 0.2 04/13/2015 1001   NITRITE NEGATIVE 02/11/2022 1348   LEUKOCYTESUR NEGATIVE 02/11/2022 1348    EKG: Independently reviewed.  None  Radiological Exams on Admission: MR BRAIN WO CONTRAST  Result Date: 02/11/2022 CLINICAL DATA:  Altered mental status EXAM: MRI HEAD WITHOUT CONTRAST TECHNIQUE: Multiplanar, multiecho pulse sequences of the brain and surrounding structures were obtained without intravenous contrast. COMPARISON:  02/14/2021 FINDINGS: Evaluation is somewhat limited by motion artifact. Brain: No restricted diffusion to suggest acute or subacute infarct. No acute hemorrhage, mass, mass effect, or midline shift. No hydrocephalus or extra-axial collection. No hemosiderin deposition to suggest remote hemorrhage. Scattered T2 hyperintense signal in the periventricular white matter, likely the sequela of mild chronic small vessel ischemic disease. Vascular: Normal flow voids. Skull and upper cervical spine: Normal marrow signal. Sinuses/Orbits: No acute finding. Status post bilateral lens replacements. Other: The mastoids are well aerated. IMPRESSION: Evaluation is somewhat limited by motion artifact. Within this limitation, no acute intracranial process. No evidence of acute or subacute infarct. Electronically Signed   By: Merilyn Baba M.D.   On: 02/11/2022 21:53   CT Angio Chest Pulmonary Embolism (PE) W or WO Contrast  Result Date: 02/11/2022 CLINICAL DATA:  Altered mental status. EXAM: CT ANGIOGRAPHY CHEST WITH CONTRAST TECHNIQUE: Multidetector CT imaging of the chest was performed  using the standard protocol during bolus administration of intravenous contrast. Multiplanar CT image reconstructions and MIPs were obtained to evaluate the vascular anatomy. RADIATION DOSE REDUCTION: This exam was performed according to the departmental dose-optimization program which includes automated exposure control, adjustment of the mA and/or kV according to patient size and/or use of iterative reconstruction technique. CONTRAST:  151m OMNIPAQUE IOHEXOL 350 MG/ML SOLN COMPARISON:  January 31, 2022. FINDINGS: Cardiovascular: Satisfactory opacification of the pulmonary arteries to the segmental level. No evidence of pulmonary embolism. Normal heart size. No pericardial effusion. Status post coronary artery bypass graft. Atherosclerosis of thoracic aorta is noted without aneurysm or dissection. Mediastinum/Nodes: Small sliding-type hiatal hernia is noted. No adenopathy is noted. Thyroid gland is unremarkable. Lungs/Pleura:  No pneumothorax or pleural effusion is noted. Mild bibasilar reticular and interstitial densities are noted which may represent chronic interstitial lung disease. Stable treated lesion is again noted in superior segment of left lower lobe. Upper Abdomen: No acute abnormality. Musculoskeletal: No chest wall abnormality. No acute or significant osseous findings. Review of the MIP images confirms the above findings. IMPRESSION: No definite evidence of pulmonary embolus. Small sliding-type hiatal hernia. 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 Stable treated lesion is again noted in superior segment of left lower lobe. Status post coronary bypass graft. Aortic Atherosclerosis (ICD10-I70.0). Electronically Signed   By: Marijo Conception M.D.   On: 02/11/2022 19:34   CT Head W or Wo Contrast  Result Date: 02/11/2022 CLINICAL DATA:  Delirium altered mental,  lung cancer patient EXAM: CT HEAD WITHOUT AND WITH CONTRAST  TECHNIQUE: Contiguous axial images were obtained from the base of the skull through the vertex without and with intravenous contrast. RADIATION DOSE REDUCTION: This exam was performed according to the departmental dose-optimization program which includes automated exposure control, adjustment of the mA and/or kV according to patient size and/or use of iterative reconstruction technique. CONTRAST:  61m OMNIPAQUE IOHEXOL 300 MG/ML  SOLN COMPARISON:  MRI head February 14, 2021. FINDINGS: Brain: No evidence of acute infarction, hemorrhage, hydrocephalus, extra-axial collection or mass lesion/mass effect. No pathologic enhancement Vascular: No hyperdense vessel identified. Skull: No acute fracture. Sinuses/Orbits: Clear visualized sinuses. No acute orbital findings. Other: No mastoid effusions. IMPRESSION: No evidence of acute intracranial abnormality or metastatic disease. An MRI with contrast could provide more sensitive evaluation if clinically warranted. Electronically Signed   By: FMargaretha SheffieldM.D.   On: 02/11/2022 17:28   DG Chest 2 View  Result Date: 02/11/2022 CLINICAL DATA:  Suspected sepsis.  Altered mental status. EXAM: CHEST - 2 VIEW COMPARISON:  Chest radiograph and chest CT January 31, 2022. FINDINGS: The heart size and mediastinal contours are unchanged. Accessed right chest Port-A-Cath with tip at the superior cavoatrial junction. Prior median sternotomy and CABG. Aortic atherosclerosis. Similar basilar predominant coarse reticular and reticulonodular opacities again most suggestive of interstitial lung disease. No new focal airspace consolidation. Multilevel degenerative changes spine. IMPRESSION: Chronic parenchymal lung changes appear similar to prior without new focal airspace consolidation. Aortic Atherosclerosis (ICD10-I70.0). Electronically Signed   By: JDahlia BailiffM.D.   On: 02/11/2022 16:32     Physical Exam: Vitals:   02/11/22 1930 02/11/22 2000 02/11/22 2045 02/11/22 2157  BP:  138/73 (!) 146/85 (!) 156/84 (!) 137/56  Pulse: 81 73 78 77  Resp:   20 18  Temp:    99.1 F (37.3 C)  TempSrc:      SpO2: 95% 92% 93% 97%  Weight:       Physical Exam Vitals and nursing note reviewed.  Constitutional:      General: He is not in acute distress.    Appearance: Normal appearance. He is not ill-appearing, toxic-appearing or diaphoretic.  HENT:     Head: Normocephalic and atraumatic.     Right Ear: Hearing and external ear normal.     Left Ear: Hearing and external ear normal.     Nose: Nose normal. No nasal deformity.     Mouth/Throat:     Lips: Pink.     Mouth: Mucous membranes are moist.     Tongue: No lesions.     Pharynx: Oropharynx is clear.  Eyes:     Extraocular  Movements: Extraocular movements intact.     Pupils: Pupils are equal, round, and reactive to light.  Cardiovascular:     Rate and Rhythm: Normal rate and regular rhythm.     Pulses: Normal pulses.     Heart sounds: Normal heart sounds.  Pulmonary:     Effort: Pulmonary effort is normal.     Breath sounds: Normal breath sounds.  Abdominal:     General: Bowel sounds are normal. There is no distension.     Palpations: Abdomen is soft. There is no mass.     Tenderness: There is no abdominal tenderness. There is no guarding.     Hernia: No hernia is present.  Musculoskeletal:     Right lower leg: No edema.     Left lower leg: No edema.  Skin:    General: Skin is warm.  Neurological:     General: No focal deficit present.     Mental Status: He is alert. He is disoriented.     Cranial Nerves: Cranial nerves 2-12 are intact.     Motor: Motor function is intact.  Psychiatric:        Attention and Perception: Attention normal.        Mood and Affect: Mood normal.        Speech: Speech normal.        Behavior: Behavior is cooperative.     Assessment and Plan: * Confusion and disorientation ? If this is related to his chronic illness.  Neuro imaging is negative so far.  Will get eeg as  well.  ? If this is related to tramadol.    Acute sinusitis Patient is status post antibiotic therapy and prednisone which we will continue.  Along with Flonase.  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 Dr Renae Fickle.  Asymptomatic. CT shows abnormal findings but stable. IMPRESSION: No definite evidence of pulmonary embolus.   Small sliding-type hiatal hernia.   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   Stable treated lesion is again noted in superior segment of left lower lobe.   Status post coronary bypass graft.   Aortic Atherosclerosis (ICD10-I70.0).  BPH (benign prostatic hyperplasia) Cont proscar.     Nasal sinus congestion Parents are weakness  OSA (obstructive sleep apnea) CPAP per home settings per respiratory therapist.  Essential hypertension Vitals:   02/11/22 1545 02/11/22 1600 02/11/22 1630 02/11/22 1700  BP: 133/72 138/79 (!) 151/72 (!) 143/82   02/11/22 1730 02/11/22 1929 02/11/22 1930 02/11/22 2000  BP: (!) 153/86 (!) 143/81 138/73 (!) 146/85   02/11/22 2045 02/11/22 2157  BP: (!) 156/84 (!) 137/56  Will continue patient on ACE or ARB substitute per pharmacy.      Unresulted Labs (From admission, onward)     Start     Ordered   02/12/22 0500  Comprehensive metabolic panel  Tomorrow morning,   STAT        02/11/22 1957   02/12/22 0500  CBC  Tomorrow morning,   STAT        02/11/22 1957   02/11/22 2023  Vitamin B12  Add-on,   AD        02/11/22 2023   02/11/22 2023  Hemoglobin A1c  Add-on,   AD        02/11/22 2023   02/11/22 1613  Respiratory (~20 pathogens) panel by PCR  (Respiratory panel by PCR (~20 pathogens, ~24 hr TAT)  w precautions)  Once,   URGENT        02/11/22 1612   02/11/22 1508  Culture, blood (Routine x 2)  BLOOD CULTURE X 2,   STAT      02/11/22 1508             DVT prophylaxis:  Heparin    Code Status:   Full Code    Family Communication:  Anastasia Fiedler (Sister)  [phone] (Mobile)   Disposition Plan:  Home    Consults called:  None   Admission status: Observation.    Unit: Med tele.    Para Skeans MD [facility]  6 PM- 2 AM. Please contact me via secure Chat 6 PM-2 AM. [phone] ( Pager ) To contact the [facility] Attending or Consulting provider [place] or covering provider during after hours [place], for this patient.   Check the care team in [facility] and look for a) attending/consulting [facility] provider listed and b) the [name] team listed Log into [email or web address] and use [place]'s universal password to access. If you do not have the password, please contact the hospital operator. Locate the [facility] provider you are looking for under [facility] and page to a number that you can be directly reached. If you still have difficulty reaching the provider, please page the [facility] (Director on Call) for the Hospitalists listed on amion for assistance. [email or web address] 02/11/2022, 10:50 PM

## 2022-02-11 NOTE — Progress Notes (Signed)
Symptom Management [place] at [facility] Telephone:(336) [phone] Fax:([phone]) [phone]  Patient Care Team: Randall Bush, MD as PCP - General (Family Medicine) [name] [name], MD as PCP - Cardiology (Cardiology) Randall Mc, MD (Internal Medicine) Randall Merritts, MD as Consulting Physician (Cardiology) Randall Partridge, DO as Consulting Physician (Neurology) Randall Sickle, MD as Consulting Physician (Hematology and Oncology) Randall Nab, RN as Oncology Nurse Navigator   Name of the patient: Randall Ali  [id number]  [birth year]   Date of visit: 02/11/22  Reason for Consult: Randall Ali is a 81 year old male with multiple medical problems including CAD status post CABG, PVD status post stenting, stage IV non-small cell lung cancer initially diagnosed May 2022 status post Carbo/Taxol now on maintenance Keytruda.  Patient has had previous and ongoing fatigue from [facility] requiring treatment holiday.  Patient has CTA chest 02/01/2019 without evidence of PE.  Stable size of left lower lobe mass with no new suspicious nodules or evidence of disease progression.  Imaging suggested interstitial lung disease.  Patient saw Dr. Rogue Ali on 02/06/2022 -patient continues to hold treatment given overall stable findings on imaging and with recent shortness of breath.  Shortness of breath possibly related to immunotherapy with evidence of ILD on recent CTA.  Patient was referred to pulmonary.  He was started on prednisone taper over 2 weeks in addition to azithromycin.  Patient presents to [facility] today with altered mental status.  Patient was accompanied by his daughter who describes confusion for 48 hours.  Patient was unable to operate the remote and was forgetful of family names.  Daughter denies fever or chills although patient was noted to be mildly febrile upon presentation to clinic.  No changes in shortness of breath or cough.  Patient does  describe possible throat soreness but denies rhinorrhea.  No chest pain.  Patient denies urinary symptoms.  No nausea/vomiting/diarrhea.   Denies any neurologic complaints. Denies recent fevers or illnesses. Denies any easy bleeding or bruising. Reports good appetite and denies weight loss. Denies chest pain. Denies any nausea, vomiting, constipation.  Denies urinary complaints. Patient offers no further specific complaints today.  PAST MEDICAL HISTORY: Past Medical History:  Diagnosis Date   Allergy    seasonal   Arthritis    all over- in general    CAD (coronary artery disease)    a. inferior wall MI 10/01 s/p PCI/DES to RCA; b. Myoview 3/16 neg for ischemia; c. LHC 8/16: ostLAD 80%, OM1 70%, OM2 70% x 2 lesions, mRCA 30%, dRCA 70% s/p 4-V CABG 01/24/15 (LIMA-LAD, VG- OM1, VG-OM2, VG-PDA)    Cancer (HCC)    skin, melanoma   Carotid artery disease ([place])    a. [place] 8/16: 1-39% bilateral ICA stenosis   Cataract    removed   Diastolic dysfunction    a. TTE 8/16: EF 55-60%, no RWMA, Gr1DD, calcified mitral annulus, mild biatrial enlargement   Erectile dysfunction    GERD (gastroesophageal reflux disease)    History of elbow surgery    History of hiatal hernia    HLD (hyperlipidemia)    HTN (hypertension)    Inferior myocardial infarction ([place]) 03/2000   stent RCA   Lung cancer ([place])    Postoperative wound infection 02/02/2015   Reflux esophagitis    Sleep apnea 2017   CPAP at night    PAST SURGICAL HISTORY:  Past Surgical History:  Procedure Laterality Date   arm surgery  2010   BROW LIFT Bilateral  11/25/2019   Procedure: BROW PTOSIS REPAIR BILATERAL;  Surgeon: Karle Starch, MD;  Location: [place];  Service: Ophthalmology;  Laterality: Bilateral;  sleep apnea   CARDIAC CATHETERIZATION  06/24/2011   CARDIAC CATHETERIZATION N/A 01/18/2015   Procedure: Left Heart Cath with coronary angiography;  Surgeon: Randall Merritts, MD;  Location: [place] [facility];  Service:  Cardiovascular;  Laterality: N/A;   CARDIAC CATHETERIZATION N/A 01/18/2015   Procedure: Intravascular Pressure Wire/FFR Study;  Surgeon: [name], MD;  Location: [place] [facility];  Service: Cardiovascular;  Laterality: N/A;   CAROTID STENT  03/10/2011   COLONOSCOPY  2010   COLONOSCOPY  06/14/2014   Dr Hilarie Fredrickson   COLONOSCOPY  01/2022   done for colon thickening at splenic flexure - 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[name])   CORONARY ARTERY BYPASS GRAFT N/A 01/24/2015   Procedure: CORONARY ARTERY BYPASS GRAFTING x 4 (LIMA-LAD, SVG-Int 1- Int 2, SVG-PD) ENDOSCOPIC GREATER SAPHENOUS VEIN HARVEST LEFT LEG;  Surgeon: [name], MD;  Location: [place];  Service: Open Heart Surgery;  Laterality: N/A;   EMBOLECTOMY  06/15/2019   Procedure: EMBOLECTOMY;  Surgeon: Katha Cabal, MD;  Location: [facility];  Service: Vascular;;  right superficial femoral artery   ENDARTERECTOMY FEMORAL Right 06/15/2019   Procedure: ENDARTERECTOMY FEMORAL;  Surgeon: Katha Cabal, MD;  Location: [facility];  Service: Vascular;  Laterality: Right;  common femoral profunda femoris superficial femoral   ESOPHAGOGASTRODUODENOSCOPY (EGD) WITH PROPOFOL N/A 04/24/2016   Procedure: ESOPHAGOGASTRODUODENOSCOPY (EGD) WITH PROPOFOL;  Surgeon: Jerene Bears, MD;  Location: [facility];  Service: Gastroenterology;  Laterality: N/A;   EYE SURGERY     lasik 15 yrs. ago, cataracts removed - both eyes    HAMMER TOE SURGERY     right toe   INSERTION OF ILIAC STENT Right 06/15/2019   Procedure: INSERTION OF ILIAC STENT ( STENTING OF SFA/POP ARTERY );  Surgeon: Katha Cabal, MD;  Location: [facility];  Service: Vascular;  Laterality: Right;  angioplpasty and stent placement: right superficial femoral right tibiopopliteal trunk bilateral common iliac arteries   IR IMAGING GUIDED PORT INSERTION  11/09/2020   LEFT HEART CATH AND CORONARY ANGIOGRAPHY Left 06/10/2017   Procedure: LEFT HEART CATH AND  CORONARY ANGIOGRAPHY;  Surgeon: Randall Merritts, MD;  Location: [place] [facility];  Service: Cardiovascular;  Laterality: Left;   LOWER EXTREMITY ANGIOGRAPHY Left 01/04/2019   Procedure: LOWER EXTREMITY ANGIOGRAPHY;  Surgeon: Katha Cabal, MD;  Location: [place] [facility];  Service: Cardiovascular;  Laterality: Left;   LOWER EXTREMITY ANGIOGRAPHY Right 01/25/2019   Procedure: LOWER EXTREMITY ANGIOGRAPHY;  Surgeon: Katha Cabal, MD;  Location: [place] [facility];  Service: Cardiovascular;  Laterality: Right;   LOWER EXTREMITY ANGIOGRAPHY Right 01/15/2021   LOWER EXTREMITY ANGIOGRAPHY and stent placement to R SFA and popliteal artery Delana Meyer, Dolores Lory, MD)   NASAL SINUS SURGERY  2008   septpolasty, bilateral turbinate reduction   SHOULDER ARTHROSCOPY  2012   TEE WITHOUT CARDIOVERSION N/A 01/24/2015   Procedure: TRANSESOPHAGEAL ECHOCARDIOGRAM (TEE);  Surgeon: [name], MD;  Location: [place];  Service: Open Heart Surgery;  Laterality: N/A;   TOE SURGERY  1994   UPPER GI ENDOSCOPY  07/2014, 04-24-16   Dr Raquel James   WRIST SURGERY  2011    HEMATOLOGY/ONCOLOGY HISTORY:  Oncology History Overview Note  #MAY 2022-Lung cancer-non-small cell [CT guided bx] T3N1 vs stage IV Dr.Hendrickson.  MRI brain negative for malignancy.# 1. April  2022- LLL ~4.0 cm mass in the superior segment left lower lobe abuts the major fissure and the posterior pleural surface without visible chest wall invasion without pleural effusion. There 2-3 other small nodules in the left lower lobe, largest measures 9 mm in diameter. These are suspicious for same lobe satellite lesions and there is left hilar adenopathy. Assuming non-small cell lung cancer the appearance is compatible with T3 N1 M0 disease (stage IIIA).  # right suprarenal nodule-awaiting biopsy on 5/31- non-small cell [revived at tumor conference at [place]  # June 9th 2022- CARBO-TAXOl-KEYTRUDA; Fulphila  # ? Subtle adrenal insufficiency-  No acute process - -September MRI brain negative for any pituitary hypophysitis/brain metastasis. Prednisone   MOLECULAR TESTING: NGS TPS-PDL 100%; EXON 12 amplification* mutations.  # CAD [CABG 2016; Dr.Gollan]   Cancer of lower lobe of left lung ([place])  11/02/2020 Initial Diagnosis   Cancer of lower lobe of left lung ([place])   11/02/2020 Cancer Staging   Staging form: Lung, AJCC 8th Edition - Clinical: Stage IVA (cT3, cN1, cM1a) - Signed by Randall Sickle, MD on 11/02/2020   11/16/2020 - 01/02/2022 Chemotherapy   Patient is on Treatment Plan : LUNG NSCLC Carboplatin + Paclitaxel + Pembrolizumab q21d x 4 cycles / Pembrolizumab Maintenance Q21D     11/16/2020 -  Chemotherapy   Patient is on Treatment Plan : LUNG NSCLC Carboplatin (6) + Paclitaxel (200) + Pembrolizumab (200) D1 q21d x 4 cycles / Pembrolizumab (200) Maintenance D1 q21d       ALLERGIES:  has No Known Allergies.  MEDICATIONS:  Current Outpatient Medications  Medication Sig Dispense Refill   acetaminophen (TYLENOL) 325 MG tablet Take 650 mg by mouth as needed for moderate pain or mild pain.     albuterol (VENTOLIN HFA) 108 (90 Base) MCG/ACT inhaler Inhale 2 puffs into the lungs every 6 (six) hours as needed for wheezing or shortness of breath. 18 g 2   Ascorbic Acid (VITAMIN C) 1000 MG tablet Take 1,000 mg by mouth daily.     aspirin 81 MG EC tablet Take 81 mg by mouth daily.       azithromycin (ZITHROMAX Z-PAK) 250 MG tablet Take 2 tablets together with food on day 1. Then take one tablet daily for days 2-5. (Patient not taking: Reported on 02/06/2022) 6 tablet 0   Calcium-Magnesium-Vitamin D (CALCIUM 1200+D3 PO) Take 1 tablet by mouth daily.     Carboxymethylcellul-Glycerin (LUBRICATING EYE DROPS OP) Place 1 drop into both eyes daily as needed (irritation).     Cholecalciferol (VITAMIN D3) 50 MCG (2000 UT) TABS Take 2,000 Units by mouth daily.     Cyanocobalamin (B-12) 5000 MCG CAPS Take 5,000 mcg by mouth daily.      docusate sodium (COLACE) 100 MG capsule Take 200 mg by mouth at bedtime.     finasteride (PROSCAR) 5 MG tablet Take 1 tablet (5 mg total) by mouth daily. 30 tablet 6   fluticasone (FLONASE) 50 MCG/ACT nasal spray Place into both nostrils.     lidocaine-prilocaine (EMLA) cream Apply 30 -45 mins prior to port access. 30 g 3   loratadine (CLARITIN) 10 MG tablet Take 10 mg by mouth daily.     Melatonin 10 MG CAPS Take 10 mg by mouth at bedtime as needed (sleep).     nitroGLYCERIN (NITROSTAT) 0.4 MG SL tablet Place 1 tablet (0.4 mg total) under the tongue every 5 (five) minutes as needed for up to 25 doses for chest pain. 25 tablet 1  olopatadine (PATANOL) 0.1 % ophthalmic solution Place 1 drop into both eyes 2 (two) times daily. 5 mL 12   pantoprazole (PROTONIX) 40 MG tablet TAKE 1 TABLET BY MOUTH DAILY 90 tablet 0   predniSONE (DELTASONE) 20 MG tablet Take 1 tablet (20 mg total) by mouth daily with breakfast. Take 3 pills once a day for 1 week; and then 2 pills once a day for 1 week; then 1 pill a day. 60 tablet 0   prochlorperazine (COMPAZINE) 10 MG tablet Take 1 tablet (10 mg total) by mouth every 6 (six) hours as needed for nausea or vomiting. 40 tablet 1   simvastatin (ZOCOR) 40 MG tablet Take 1 tablet (40 mg total) by mouth at bedtime. 90 tablet 3   traMADol (ULTRAM) 50 MG tablet TAKE 1 TABLET BY MOUTH 3 TIMES DAILY AS NEEDED FOR MODERATE PAIN 90 tablet 0   valsartan (DIOVAN) 80 MG tablet One daily 30 tablet 11   Vitamin E 450 MG (1000 UT) CAPS Take 450 Units by mouth daily.     Zinc 50 MG TABS Take 50 mg by mouth daily.     No current facility-administered medications for this visit.   Facility-Administered Medications Ordered in Other Visits  Medication Dose Route Frequency Provider Last Rate Last Admin   heparin lock flush 100 UNIT/ML injection             VITAL SIGNS: There were no vitals taken for this visit. There were no vitals filed for this visit.  Estimated body mass index is  33.51 kg/m as calculated from the following:   Height as of 02/06/22: 5\' 8"  (1.727 m).   Weight as of 02/06/22: 220 lb 6.4 oz (100 kg).  LABS: CBC:    Component Value Date/Time   WBC 11.6 (H) 02/06/2022 0816   HGB 14.8 02/06/2022 0816   HGB 14.8 01/15/2015 0956   HCT 43.9 02/06/2022 0816   HCT 44.2 01/15/2015 0956   PLT 228 02/06/2022 0816   PLT 207 01/15/2015 0956   MCV 91.6 02/06/2022 0816   MCV 91 01/15/2015 0956   NEUTROABS 8.2 (H) 02/06/2022 0816   NEUTROABS 5.2 01/15/2015 0956   LYMPHSABS 1.9 02/06/2022 0816   LYMPHSABS 1.9 01/15/2015 0956   MONOABS 1.3 (H) 02/06/2022 0816   EOSABS 0.2 02/06/2022 0816   EOSABS 0.2 01/15/2015 0956   BASOSABS 0.1 02/06/2022 0816   BASOSABS 0.0 01/15/2015 0956   Comprehensive Metabolic Panel:    Component Value Date/Time   NA 137 02/06/2022 0816   NA 138 01/15/2015 0956   K 3.6 02/06/2022 0816   CL 104 02/06/2022 0816   CO2 25 02/06/2022 0816   BUN 23 02/06/2022 0816   BUN 16 01/15/2015 0956   CREATININE 1.01 02/06/2022 0816   GLUCOSE 88 02/06/2022 0816   CALCIUM 9.3 02/06/2022 0816   AST 21 02/06/2022 0816   ALT 18 02/06/2022 0816   ALKPHOS 61 02/06/2022 0816   BILITOT 0.8 02/06/2022 0816   PROT 7.1 02/06/2022 0816   ALBUMIN 3.8 02/06/2022 0816    RADIOGRAPHIC STUDIES: CT Angio Chest Pulmonary Embolism (PE) W or WO Contrast  Result Date: 01/31/2022 CLINICAL DATA:  81 year old male with history of positive D-dimer. Worsening shortness of breath for 1 month. Evaluate for pulmonary embolism. History of non-small cell lung cancer. * Tracking Code: BO * EXAM: CT ANGIOGRAPHY CHEST WITH CONTRAST TECHNIQUE: Multidetector CT imaging of the chest was performed using the standard protocol during bolus administration of intravenous contrast. Multiplanar CT  image reconstructions and MIPs were obtained to evaluate the vascular anatomy. RADIATION DOSE REDUCTION: This exam was performed according to the departmental dose-optimization program  which includes automated exposure control, adjustment of the mA and/or kV according to patient size and/or use of iterative reconstruction technique. CONTRAST:  78mL OMNIPAQUE IOHEXOL 350 MG/ML SOLN COMPARISON:  Chest CT 11/01/2021. FINDINGS: Cardiovascular: There are no filling defects within the pulmonary arterial tree to suggest pulmonary embolism. Heart size is borderline enlarged. There is no significant pericardial fluid, thickening or pericardial calcification. There is aortic atherosclerosis, as well as atherosclerosis of the great vessels of the mediastinum and the coronary arteries, including calcified atherosclerotic plaque in the left main, left anterior descending, left circumflex and right coronary arteries. Status post median sternotomy for CABG including LIMA to the LAD. Right internal jugular single-lumen Port-A-Cath with tip terminating at the superior cavoatrial junction. Mediastinum/Nodes: No pathologically enlarged mediastinal or hilar lymph nodes. Esophagus is unremarkable in appearance. No axillary lymphadenopathy. Lungs/Pleura: Linear area of architectural distortion and some cylindrical bronchiectasis noted in the superior segment of the left lower lobe at the site of the treated neoplasm, stable compared to the recent prior examination. No other new suspicious appearing pulmonary nodules or masses are noted. No acute consolidative airspace disease. No pleural effusions. Widespread but patchy areas of ground-glass attenuation and septal thickening are noted throughout the lungs bilaterally, most evident in the mid to lower lungs. Upper Abdomen: Atherosclerotic calcifications in the abdominal aorta. Exophytic 1.4 cm low-attenuation lesion in the upper pole of the right kidney, incompletely characterized on today's noncontrast CT examination, but statistically likely small cysts (no imaging follow-up is recommended). Musculoskeletal: Median sternotomy wires. There are no aggressive appearing  lytic or blastic lesions noted in the visualized portions of the skeleton. Review of the MIP images confirms the above findings. IMPRESSION: 1. No evidence of pulmonary embolism. 2. The treated lesion in the superior segment of the left lower lobe appears stable compared to the prior study. No new suspicious appearing pulmonary nodules or lymphadenopathy noted elsewhere in the thorax. 3. The appearance of the lungs suggests interstitial lung disease. Outpatient referral to Pulmonology for further clinical evaluation is recommended. Follow-up high-resolution chest CT should be considered if clinically appropriate. 4. Aortic atherosclerosis, in addition to left main and three-vessel coronary artery disease. Status post median sternotomy for CABG including LIMA to the LAD. Aortic Atherosclerosis (ICD10-I70.0). Electronically Signed   By: Vinnie Langton M.D.   On: 01/31/2022 10:28   DG Chest 2 View  Result Date: 01/31/2022 CLINICAL DATA:  Short of breath EXAM: CHEST - 2 VIEW COMPARISON:  12/11/2021 FINDINGS: Right chest wall port catheter is unchanged. Progressive interstitial changes. No pleural effusion or pneumothorax. Stable cardiomediastinal contours. No acute osseous abnormality. IMPRESSION: Progressive interstitial changes may reflect edema or atypical/viral pneumonia. Electronically Signed   By: Macy Mis M.D.   On: 01/31/2022 09:45    PERFORMANCE STATUS (ECOG) : 1 - Symptomatic but completely ambulatory  Review of Systems Unless otherwise noted, a complete review of systems is negative.  Physical Exam General: NAD Cardiovascular: regular rate and rhythm Pulmonary: clear ant fields Abdomen: soft, nontender, + bowel sounds GU: no suprapubic tenderness Extremities: no edema, no joint deformities Skin: no rashes Neurological: Mildly confused, CN II through XII intact  Assessment and Plan- Patient is a 81 y.o. male with multiple medical problems including CAD status post CABG, PVD status  post stenting, stage IV non-small cell lung cancer initially diagnosed May 2022 status post Carbo/Taxol  and maintenance Keytruda.  Patient has had previous and ongoing fatigue from [facility] requiring treatment holiday.     Altered mental status-patient markedly different from baseline.  He is oriented only to person.  Unclear etiology although temperature of 100.6 suggestive of infectious etiology.  Labs show mild hyponatremia and hypokalemia.   Patient recently completed course of azithromycin for possible pneumonia.  Will send for urinalysis and culture. Case and plan discussed with Dr. Rogue Ali who recommended patient transfer to ED for further evaluation and management and to obtain CT/MRI of the brain.  Report called to ED triage.  Case and plan discussed with Dr. Rogue Ali  Patient expressed understanding and was in agreement with this plan. He also understands that He can call clinic at any time with any questions, concerns, or complaints.   Thank you for allowing me to participate in the care of this very pleasant patient.   Time Total: 15 minutes  Visit consisted of counseling and education dealing with the complex and emotionally intense issues of symptom management in the setting of serious illness.Greater than 50%  of this time was spent counseling and coordinating care related to the above assessment and plan.  Signed by: Altha Harm, PhD, NP-C

## 2022-02-11 NOTE — Telephone Encounter (Signed)
Patient daughter called and stated that they have an appointment at the cancer center and if they need anything they will call back.

## 2022-02-11 NOTE — Assessment & Plan Note (Signed)
Patient is status post antibiotic therapy and prednisone which we will continue. Continue Flonase.

## 2022-02-11 NOTE — Assessment & Plan Note (Signed)
Continue Proscar

## 2022-02-12 ENCOUNTER — Other Ambulatory Visit: Payer: Self-pay

## 2022-02-12 DIAGNOSIS — I739 Peripheral vascular disease, unspecified: Secondary | ICD-10-CM | POA: Diagnosis present

## 2022-02-12 DIAGNOSIS — R7989 Other specified abnormal findings of blood chemistry: Secondary | ICD-10-CM | POA: Diagnosis present

## 2022-02-12 DIAGNOSIS — E785 Hyperlipidemia, unspecified: Secondary | ICD-10-CM | POA: Diagnosis present

## 2022-02-12 DIAGNOSIS — E871 Hypo-osmolality and hyponatremia: Secondary | ICD-10-CM | POA: Diagnosis present

## 2022-02-12 DIAGNOSIS — N400 Enlarged prostate without lower urinary tract symptoms: Secondary | ICD-10-CM | POA: Diagnosis present

## 2022-02-12 DIAGNOSIS — K219 Gastro-esophageal reflux disease without esophagitis: Secondary | ICD-10-CM | POA: Diagnosis present

## 2022-02-12 DIAGNOSIS — I10 Essential (primary) hypertension: Secondary | ICD-10-CM | POA: Diagnosis present

## 2022-02-12 DIAGNOSIS — Z87891 Personal history of nicotine dependence: Secondary | ICD-10-CM | POA: Diagnosis not present

## 2022-02-12 DIAGNOSIS — G9341 Metabolic encephalopathy: Secondary | ICD-10-CM

## 2022-02-12 DIAGNOSIS — E669 Obesity, unspecified: Secondary | ICD-10-CM | POA: Diagnosis present

## 2022-02-12 DIAGNOSIS — G4733 Obstructive sleep apnea (adult) (pediatric): Secondary | ICD-10-CM | POA: Diagnosis present

## 2022-02-12 DIAGNOSIS — Z20822 Contact with and (suspected) exposure to covid-19: Secondary | ICD-10-CM | POA: Diagnosis present

## 2022-02-12 DIAGNOSIS — J329 Chronic sinusitis, unspecified: Secondary | ICD-10-CM | POA: Diagnosis not present

## 2022-02-12 DIAGNOSIS — G929 Unspecified toxic encephalopathy: Secondary | ICD-10-CM | POA: Insufficient documentation

## 2022-02-12 DIAGNOSIS — E860 Dehydration: Secondary | ICD-10-CM | POA: Diagnosis present

## 2022-02-12 DIAGNOSIS — R4189 Other symptoms and signs involving cognitive functions and awareness: Secondary | ICD-10-CM | POA: Insufficient documentation

## 2022-02-12 DIAGNOSIS — R410 Disorientation, unspecified: Secondary | ICD-10-CM | POA: Diagnosis not present

## 2022-02-12 DIAGNOSIS — E876 Hypokalemia: Secondary | ICD-10-CM | POA: Diagnosis present

## 2022-02-12 DIAGNOSIS — R413 Other amnesia: Secondary | ICD-10-CM | POA: Insufficient documentation

## 2022-02-12 DIAGNOSIS — R4182 Altered mental status, unspecified: Secondary | ICD-10-CM | POA: Diagnosis not present

## 2022-02-12 DIAGNOSIS — B964 Proteus (mirabilis) (morganii) as the cause of diseases classified elsewhere: Secondary | ICD-10-CM | POA: Diagnosis present

## 2022-02-12 DIAGNOSIS — M47816 Spondylosis without myelopathy or radiculopathy, lumbar region: Secondary | ICD-10-CM | POA: Diagnosis present

## 2022-02-12 DIAGNOSIS — Z85820 Personal history of malignant melanoma of skin: Secondary | ICD-10-CM | POA: Diagnosis not present

## 2022-02-12 DIAGNOSIS — I2510 Atherosclerotic heart disease of native coronary artery without angina pectoris: Secondary | ICD-10-CM | POA: Diagnosis present

## 2022-02-12 DIAGNOSIS — N390 Urinary tract infection, site not specified: Secondary | ICD-10-CM | POA: Diagnosis present

## 2022-02-12 DIAGNOSIS — I252 Old myocardial infarction: Secondary | ICD-10-CM | POA: Diagnosis not present

## 2022-02-12 DIAGNOSIS — C3432 Malignant neoplasm of lower lobe, left bronchus or lung: Secondary | ICD-10-CM | POA: Diagnosis present

## 2022-02-12 DIAGNOSIS — N3000 Acute cystitis without hematuria: Secondary | ICD-10-CM | POA: Diagnosis not present

## 2022-02-12 DIAGNOSIS — Z951 Presence of aortocoronary bypass graft: Secondary | ICD-10-CM | POA: Diagnosis not present

## 2022-02-12 DIAGNOSIS — I708 Atherosclerosis of other arteries: Secondary | ICD-10-CM | POA: Diagnosis present

## 2022-02-12 DIAGNOSIS — B1009 Other human herpesvirus encephalitis: Secondary | ICD-10-CM | POA: Diagnosis present

## 2022-02-12 DIAGNOSIS — E66811 Obesity, class 1: Secondary | ICD-10-CM | POA: Diagnosis present

## 2022-02-12 DIAGNOSIS — Z6831 Body mass index (BMI) 31.0-31.9, adult: Secondary | ICD-10-CM | POA: Diagnosis not present

## 2022-02-12 LAB — CBC
HCT: 44.5 % (ref 39.0–52.0)
Hemoglobin: 15.2 g/dL (ref 13.0–17.0)
MCH: 30.3 pg (ref 26.0–34.0)
MCHC: 34.2 g/dL (ref 30.0–36.0)
MCV: 88.8 fL (ref 80.0–100.0)
Platelets: 202 10*3/uL (ref 150–400)
RBC: 5.01 MIL/uL (ref 4.22–5.81)
RDW: 12.5 % (ref 11.5–15.5)
WBC: 10.2 10*3/uL (ref 4.0–10.5)
nRBC: 0 % (ref 0.0–0.2)

## 2022-02-12 LAB — COMPREHENSIVE METABOLIC PANEL
ALT: 15 U/L (ref 0–44)
AST: 13 U/L — ABNORMAL LOW (ref 15–41)
Albumin: 3.7 g/dL (ref 3.5–5.0)
Alkaline Phosphatase: 55 U/L (ref 38–126)
Anion gap: 7 (ref 5–15)
BUN: 19 mg/dL (ref 8–23)
CO2: 24 mmol/L (ref 22–32)
Calcium: 8.8 mg/dL — ABNORMAL LOW (ref 8.9–10.3)
Chloride: 104 mmol/L (ref 98–111)
Creatinine, Ser: 1.09 mg/dL (ref 0.61–1.24)
GFR, Estimated: >60 mL/min (ref >60)
Glucose, Bld: 111 mg/dL — ABNORMAL HIGH (ref 70–99)
Potassium: 3.8 mmol/L (ref 3.5–5.1)
Sodium: 135 mmol/L (ref 135–145)
Total Bilirubin: 0.6 mg/dL (ref 0.3–1.2)
Total Protein: 6.4 g/dL — ABNORMAL LOW (ref 6.5–8.1)

## 2022-02-12 LAB — LACTIC ACID, PLASMA: Lactic Acid, Venous: 1.8 mmol/L (ref 0.5–1.9)

## 2022-02-12 LAB — VITAMIN B12: Vitamin B-12: 416 pg/mL (ref 180–914)

## 2022-02-12 LAB — HEMOGLOBIN A1C
Hgb A1c MFr Bld: 5.8 % — ABNORMAL HIGH (ref 4.8–5.6)
Mean Plasma Glucose: 119.76 mg/dL

## 2022-02-12 LAB — TROPONIN I (HIGH SENSITIVITY): Troponin I (High Sensitivity): 13 ng/L (ref <18)

## 2022-02-12 LAB — PROCALCITONIN: Procalcitonin: <0.1 ng/mL

## 2022-02-12 LAB — AMMONIA: Ammonia: 12 umol/L (ref 9–35)

## 2022-02-12 MED ORDER — SODIUM CHLORIDE 0.9 % IV SOLN
1.0000 g | INTRAVENOUS | Status: AC
  Administered 2022-02-13 (×2): 1 g via INTRAVENOUS
  Filled 2022-02-12 (×2): qty 1

## 2022-02-12 MED ORDER — CHLORHEXIDINE GLUCONATE CLOTH 2 % EX PADS
6.0000 | MEDICATED_PAD | Freq: Every day | CUTANEOUS | Status: DC
  Administered 2022-02-12 – 2022-02-14 (×3): 6 via TOPICAL

## 2022-02-12 MED ORDER — SODIUM CHLORIDE 0.9% FLUSH: 10.0000 mL | INTRAVENOUS | Status: DC | PRN

## 2022-02-12 MED ORDER — HALOPERIDOL LACTATE 5 MG/ML IJ SOLN: 1.0000 mg | Freq: Once | INTRAMUSCULAR | Status: DC | PRN

## 2022-02-12 MED ORDER — SODIUM CHLORIDE 0.9% FLUSH
10.0000 mL | Freq: Two times a day (BID) | INTRAVENOUS | Status: DC
  Administered 2022-02-12 – 2022-02-14 (×4): 10 mL

## 2022-02-12 NOTE — Assessment & Plan Note (Addendum)
Lactic acid trended 1.4 >> 2.0 >> 1.8 normalized Treated with IV fluids.  Due to infection and dehydration

## 2022-02-12 NOTE — Procedures (Signed)
Patient Name: Randall Ali.  MRN: [id number]  Epilepsy Attending: Lora Havens  Referring Physician/Provider: Para Skeans, MD  Date: 02/12/2022 Duration: 22.31 mins  Patient history: 81yo M with ams. EEG to evaluate for seizure.  Level of alertness: Awake  AEDs during EEG study: None  Technical aspects: This EEG study was done with scalp electrodes positioned according to the 10-20 International system of electrode placement. Electrical activity was reviewed with band pass filter of 1-70Hz , sensitivity of 7 uV/mm, display speed of 20mm/sec with a 60Hz  notched filter applied as appropriate. EEG data were recorded continuously and digitally stored.  Video monitoring was available and reviewed as appropriate.  Description: The posterior dominant rhythm consists of 7.5 Hz activity of moderate voltage (25-35 uV) seen predominantly in posterior head regions, symmetric and reactive to eye opening and eye closing. EEG showed continuous generalized 3 to 6 Hz theta-delta slowing. Physiologic photic driving was not seen during photic stimulation. Hyperventilation was not performed.     ABNORMALITY - Continuous slow, generalized  IMPRESSION: This study is suggestive of moderate diffuse encephalopathy, nonspecific etiology. No seizures or epileptiform discharges were seen throughout the recording.  Marshawn Normoyle Barbra Sarks

## 2022-02-12 NOTE — Evaluation (Signed)
Physical Therapy Evaluation Patient Details Name: Randall Ali. MRN: [id number] DOB: 06/12/1940 Today's Date: 02/12/2022  History of Present Illness  Randall Ali. is a 81 y.o. male seen in ed for confusion.  Daughter states concern of tramadol causing confusion.  Patient's confusion is noted since Sunday 02/09/22, with difficulty recalling names and difficulty remembering his TV channels.  Chart review shows that patient sees oncology for his lung cancer gets Keytruda.  Patient was also treated for steroid taper and azithromycin. CT/ MRI both negative for any acute abnormality.   Clinical Impression  Patient received in bed, sleeping. Son in room also sleeping. Patient wakes to my voice. He is not oriented other than to self. Patient is independent with bed mobility. Transfers with min guard/supervision and ambulated 25 feet with min guard. Decreased safety awareness. Slightly impulsive with mobility. He will continue to benefit from skilled PT while here to improve safety and independence.          Recommendations for follow up therapy are one component of a multi-disciplinary discharge planning process, led by the attending physician.  Recommendations may be updated based on patient status, additional functional criteria and insurance authorization.  Follow Up Recommendations Home health PT      Assistance Recommended at Discharge Frequent or constant Supervision/Assistance  Patient can return home with the following  A little help with walking and/or transfers;A little help with bathing/dressing/bathroom;Direct supervision/assist for medications management;Assistance with cooking/housework;Assist for transportation    Equipment Recommendations None recommended by PT  Recommendations for Other Services       Functional Status Assessment Patient has had a recent decline in their functional status and demonstrates the ability to make significant improvements in function in a  reasonable and predictable amount of time.     Precautions / Restrictions Precautions Precautions: Fall Restrictions Weight Bearing Restrictions: No      Mobility  Bed Mobility Overal bed mobility: Independent                  Transfers Overall transfer level: Needs assistance Equipment used: None Transfers: Sit to/from Stand Sit to Stand: Supervision                Ambulation/Gait Ambulation/Gait assistance: Min guard Gait Distance (Feet): 25 Feet Assistive device: None Gait Pattern/deviations: Step-through pattern Gait velocity: WNL     General Gait Details: somewhat impulsive with mobility. moves well. no lob.  Stairs            Wheelchair Mobility    Modified Rankin (Stroke Patients Only)       Balance Overall balance assessment: Needs assistance Sitting-balance support: Feet supported Sitting balance-Leahy Scale: Good     Standing balance support: No upper extremity supported, During functional activity Standing balance-Leahy Scale: Fair                               Pertinent Vitals/Pain Pain Assessment Pain Assessment: No/denies pain    Home Living Family/patient expects to be discharged to:: Private residence Living Arrangements: Alone Available Help at Discharge: Family;Available 24 hours/day Type of Home: House Home Access: Level entry       Home Layout: One level Home Equipment: None      Prior Function Prior Level of Function : Independent/Modified Independent;Driving;History of Falls (last six months)             Mobility Comments: patient was fully independent pta. ADLs Comments:  fully independent pta.     Hand Dominance        Extremity/Trunk Assessment   Upper Extremity Assessment Upper Extremity Assessment: Overall WFL for tasks assessed    Lower Extremity Assessment Lower Extremity Assessment: Overall WFL for tasks assessed    Cervical / Trunk Assessment Cervical / Trunk  Assessment: Normal  Communication   Communication: No difficulties  Cognition Arousal/Alertness: Lethargic Behavior During Therapy: Agitated Overall Cognitive Status: Impaired/Different from baseline Area of Impairment: Orientation                 Orientation Level: Disoriented to, Place, Time, Situation                      General Comments      Exercises     Assessment/Plan    PT Assessment Patient needs continued PT services  PT Problem List Decreased balance;Decreased mobility;Decreased safety awareness;Decreased cognition       PT Treatment Interventions Gait training;Stair training;Functional mobility training;Therapeutic activities;Patient/family education;Cognitive remediation;Therapeutic exercise    PT Goals (Current goals can be found in the Care Plan section)  Acute Rehab PT Goals Patient Stated Goal: for patient to return home. Family can stay with him 24/7 if needed PT Goal Formulation: With family Time For Goal Achievement: 02/26/22 Potential to Achieve Goals: Good    Frequency Min 2X/week     Co-evaluation               AM-PAC PT "6 Clicks" Mobility  Outcome Measure Help needed turning from your back to your side while in a flat bed without using bedrails?: None Help needed moving from lying on your back to sitting on the side of a flat bed without using bedrails?: None Help needed moving to and from a bed to a chair (including a wheelchair)?: A Little Help needed standing up from a chair using your arms (e.g., wheelchair or bedside chair)?: A Little Help needed to walk in hospital room?: A Little Help needed climbing 3-5 steps with a railing? : A Little 6 Click Score: 20    End of Session Equipment Utilized During Treatment: Oxygen Activity Tolerance: Patient limited by lethargy Patient left: in bed;with call bell/phone within reach;with bed alarm set;with family/visitor present Nurse Communication: Mobility status;Other  (comment) (daughter wanted RN to look at his big toe as possible source of infection.) PT Visit Diagnosis: Other abnormalities of gait and mobility (R26.89);Unsteadiness on feet (R26.81)    Time: 7619-5093 PT Time Calculation (min) (ACUTE ONLY): 10 min   Charges:   PT Evaluation $PT Eval Low Complexity: 1 Low          Kahlea Cobert, PT, GCS 02/12/22,12:32 PM

## 2022-02-12 NOTE — Progress Notes (Addendum)
Progress Note   Patient: Randall Ali. [id number]:[id number] DOB: 1940/08/18 DOA: 02/11/2022     1 DOS: the patient was seen and examined on 02/13/2022   Brief hospital course: 81 year old male with past medical history of non-small cell lung cancer on Keytruda, CAD, HTN, R subclavian stenosis, PAD, OSA, BPH, lumbar spondylosis who presented to the ED on 02/11/2022 for evaluation of confusion with disorientation.  Pt was seen by oncology and noted to have fever 100.6.  Pt's only complaint was mild sore throat.    ED course - head CT non-acute.  Chest xray ILD pattern, nothing acute.  Hypoxic with spO2 in upper 80's on room air, with new requirement for 2 L/min supplemental oxygen.  Labs with mild hyponatremia, mild hypokalemia.  UA was clear, no infection.    On admission, pt oriented to self, but not time, place or situation.  Family report at baseline pt is fully oriented and ambulates with assist device.  He had recently completed a Z-pack and remains on a prednisone taper given for apparently sinusitis and 'breathing issues'.   Assessment and Plan: * Acute metabolic encephalopathy Suspect due to UTI. UA with rare only bacteria but no urine culture was initially obtained.  No other metabolic derangements or evidence of infection found. CXR and CTA chest without focal infiltrates, show chronic ILD changes.   Respiratory panel negative.   Flu and Covid-19 negative.  PCO2 on VBG was 38.   Ammonia level normal.  No focal neurologic deficits and CT head negative.  MRI brain was limited by motion artifact but no acute or subacute infarct was seen. Typically tolerates prednisone without confusion, per family. --Send initial urine for culture --Delirium precautions --Mgmt of other issues as outlined --Monitor closely --Neuro checks  Confusion and disorientation ? If this is related to his chronic illness vs acute upper respiratory infection. No evidence of infection so far.  Nothing on CT head  or MRI brain to explain this.   --EEG pending --Avoid sedating meds as much as possible ? If this is related to tramadol.    Acute sinusitis Patient is status post antibiotic therapy and prednisone which we will continue. Continue Flonase.  Elevated lactic acid level Lactic acid trended 1.4 >> 2.0 --Repeat lactate level --Can stop fluids if normalized and tolerating PO intake  Nasal sinus congestion Pt has sore throat, some congestion which is mildly improved since Z-pack. Still on prednisone prescribed outpatient --Continue prednisone, Flonase --Monitor  UTI (urinary tract infection) Presented with confusion and disorientation / encephalopathy.  UA had only rare bacteria, but culture grew Proteus mirabilis. --Continue Rocephin  Obesity (BMI 30-39.9) Body mass index is 31.93 kg/m. Complicates overall care and prognosis.  Recommend lifestyle modifications including physical activity and diet for weight loss and overall long-term health.   Cancer of lower lobe of left lung ([place]) Pt f/u with oncology. Dr Renae Fickle.  Asymptomatic.  CT shows abnormal findings but stable.  IMPRESSION: "No definite evidence of pulmonary embolus.  Small sliding-type hiatal hernia.  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 Stable treated lesion is again noted in superior segment of left lower lobe. Status post coronary bypass graft. Aortic Atherosclerosis (ICD10-I70.0)."  BPH (benign prostatic hyperplasia) Continue Proscar    OSA (obstructive sleep apnea) CPAP per home settings per respiratory therapist.  Essential hypertension Continue home regimen          Subjective: Pt awake resting in bed with  son and daughter at bedside today.  He reports feeling okay, still little sore throat and mild congestion.  No fever/chills or other acute complaints.  Pt can tell me his name, but otherwise is  disoriented.  Family report he is far from baseline still.   Physical Exam: Vitals:   02/12/22 2109 02/13/22 0000 02/13/22 0506 02/13/22 0831  BP: (!) 124/57  (!) 125/53 101/60  Pulse: 75  64 76  Resp: 18  20 18   Temp: (!) 100.7 F (38.2 C) 99.5 F (37.5 C) 99 F (37.2 C) 98.5 F (36.9 C)  TempSrc: Oral Oral Oral   SpO2: 93%  95% 97%  Weight:       General exam: awake, alert, no acute distress HEENT: atraumatic, clear conjunctiva, anicteric sclera, moist mucus membranes, hearing grossly normal  Respiratory system: CTAB, no wheezes, rales or rhonchi, normal respiratory effort. Cardiovascular system: normal S1/S2, RRR, no pedal edema.   Gastrointestinal system: soft, NT, ND Central nervous system: A&O x1. no gross focal neurologic deficits, normal speech Extremities: moves all, no edema, normal tone Skin: dry, intact, normal temperature Psychiatry: normal mood, congruent affect, abnormal judgement and insight   Data Reviewed:  Notable labs --- repeat lactate 1.8, procal <0.10,CBC normal, B12 normal, TSH and T4 normal.  MRI brain motion artifact but no acute or subacute infarcts.   Family Communication: Daughter and son at bedside on rounds  Disposition: Status is: Inpatient Remains inpatient appropriate because: Ongoing disorientation with evaluation still underway   Planned Discharge Destination: Home with Home Health    Time spent: 45 minutes  Author: Ezekiel Slocumb, DO 02/13/2022 9:25 AM  For on call review [email or web address].

## 2022-02-12 NOTE — TOC Initial Note (Signed)
Transition of Care (TOC) - Initial/Assessment Note    Patient Details  Name: Randall Ali. MRN: [id number] Date of Birth: 04-11-1941  Transition of Care [facility]) CM/SW Contact:    Laurena Slimmer, RN Phone Number: 02/12/2022, 12:44 AM  Clinical Narrative:                  Transition of Care [facility]) Screening Note   Patient Details  Name: Randall Ali. Date of Birth: 1941-03-23   Transition of Care [facility]) CM/SW Contact:    Laurena Slimmer, RN Phone Number: 02/12/2022, 12:44 AM    Transition of Care Department [facility]) has reviewed patient and no TOC needs have been identified at this time. We will continue to monitor patient advancement through interdisciplinary progression rounds. If new patient transition needs arise, please place a TOC consult.          Patient Goals and CMS Choice        Expected Discharge Plan and Services                                                Prior Living Arrangements/Services                       Activities of Daily Living      Permission Sought/Granted                  Emotional Assessment              Admission diagnosis:  Confusion [R41.0] Patient Active Problem List   Diagnosis Date Noted   Confusion and disorientation 02/11/2022   Cough 01/31/2022   Sinus pain 01/31/2022   Other headache syndrome 01/31/2022   Acute sinusitis 12/13/2021   DOE (dyspnea on exertion) 10/31/2021   Encounter for antineoplastic chemotherapy 09/19/2021   Other fatigue 09/19/2021   Renal mass, right 12/07/2020   Cancer of lower lobe of left lung ([place]) 11/02/2020   Mass of lower lobe of left lung 10/08/2020   Abnormal CT of the chest 10/02/2020   Atherosclerosis of artery of extremity with rest pain ([place]) 06/15/2019   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[place]) 06/15/2019   Carotid stenosis, asymptomatic, bilateral 11/25/2018   Insomnia 11/10/2018   Lumbar spondylosis 11/05/2018   Osseous and subluxation  stenosis of intervertebral foramina of lumbar region 11/05/2018   Vascular claudication ([place]) 11/05/2018   Vertigo 10/08/2018   Chronic headache 08/23/2018   Chronic back pain 09/09/2017   Subclavian artery stenosis, right ([place]) 09/09/2017   BPH (benign prostatic hyperplasia) 08/24/2017   Health maintenance examination 07/28/2016   Advanced care planning/counseling discussion 07/28/2016   Hearing loss 07/28/2016   History of esophagitis    Nasal sinus congestion 04/07/2016   Postoperative atrial fibrillation ([place]) 03/27/2015   Infection due to Enterobacter aerogenes 02/02/2015   Status post right foot surgery 02/02/2015   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   History of coronary artery stent placement    OSA (obstructive sleep apnea) 01/13/2015   Status post hardware removal 01/01/2015   Hepatic steatosis 11/24/2014   Atherosclerosis of native arteries of extremity with intermittent claudication ([place]) 04/11/2014   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   Chronic fatigue and malaise 01/11/2014   Hyperlipidemia 11/09/2013   Essential hypertension 11/09/2013   Diastasis recti 05/19/2013   H/O arthroplasty 05/19/2013  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

## 2022-02-12 NOTE — Assessment & Plan Note (Signed)
Body mass index is 31.93 kg/m. Complicates overall care and prognosis.  Recommend lifestyle modifications including physical activity and diet for weight loss and overall long-term health.

## 2022-02-12 NOTE — Progress Notes (Signed)
Pt passed bedside swallow screen.

## 2022-02-12 NOTE — Progress Notes (Signed)
       CROSS COVER NOTE  NAME: Lynford Espinoza. MRN: [id number] DOB : 1941-04-04    Date of Service   02/12/2022   HPI/Events of Note   Preliminary results from Urine culture resulted ---> gram (-) rods. Tmax 38.2C in last 24 hrs, patient had lactic acidosis of 2.0 on 9/5, and leukocytosis of 12.5 on 02/11/22.  Interventions   Plan: Rocephin     This document was prepared using Dragon voice recognition software and may include unintentional dictation errors.  Neomia Glass DNP, MHA, FNP-BC Nurse Practitioner [facility] Pager [phone]

## 2022-02-12 NOTE — Assessment & Plan Note (Addendum)
Suspect due to UTI. UA with rare only bacteria but no urine culture was initially obtained.  No other metabolic derangements or evidence of infection found. CXR and CTA chest without focal infiltrates, show chronic ILD changes.   Respiratory panel negative.   Flu and Covid-19 negative.  PCO2 on VBG was 38.   Ammonia level normal.  No focal neurologic deficits and CT head negative.  MRI brain was limited by motion artifact but no acute or subacute infarct was seen. Typically tolerates prednisone without confusion, per family. --Urine culture positive consistent wit UTI, not on antibiotics and improving --Delirium precautions --Mgmt of other issues as outlined --Monitor closely --Neuro checks

## 2022-02-12 NOTE — Progress Notes (Signed)
Eeg done 

## 2022-02-12 NOTE — Evaluation (Signed)
Occupational Therapy Evaluation Patient Details Name: Randall Ali. MRN: [id number] DOB: February 19, 1941 Today's Date: 02/12/2022   History of Present Illness Randall Ali. is a 81 y.o. male seen in ed for confusion.  Daughter states concern of tramadol causing confusion.  Patient's confusion is noted since Sunday 02/09/22, with difficulty recalling names and difficulty remembering his TV channels.  Chart review shows that patient sees oncology for his lung cancer gets Keytruda.  Patient was also treated for steroid taper and azithromycin. CT/ MRI both negative for any acute abnormality.   Clinical Impression   Patient presenting with decreased independence in functional mobility, cognition, strength and endurance. Pt's daughter present in room and was able to provide PLOF information. At baseline, pt independent with all ADLs, IADLs, driving, and working part time at [employer] store. In the home, pt has a walk in shower with a shower chair and grab bars in the shower. Pt lives at home alone, however, his daughter stated he will have 24/7 care at discharge. During eval, pt required min guard/supervision for all bed mobility. Pt was able to ambulate ~100 in the hall way with min guard/supervision. Min guard/supervision for toileting and hygiene task. Patients 02 saturation was taken after ambulating in hallway which was 96% and taken again after treatment which dropped down to 91%.  Pt was unable to follow cues for breathing techniques. Patient will benefit from acute OT to increase overall independence in the areas of ADLs, functional mobility,  in order to safely discharge to next venue of care.       Recommendations for follow up therapy are one component of a multi-disciplinary discharge planning process, led by the attending physician.  Recommendations may be updated based on patient status, additional functional criteria and insurance authorization.   Follow Up Recommendations  Home health OT     Assistance Recommended at Discharge Frequent or constant Supervision/Assistance  Patient can return home with the following A little help with walking and/or transfers;A little help with bathing/dressing/bathroom;Direct supervision/assist for financial management;Assistance with cooking/housework    Functional Status Assessment  Patient has had a recent decline in their functional status and demonstrates the ability to make significant improvements in function in a reasonable and predictable amount of time.  Equipment Recommendations  Other (comment) (Defer to next venue of care)    Recommendations for Other Services       Precautions / Restrictions Precautions Precautions: Fall Restrictions Weight Bearing Restrictions: No      Mobility Bed Mobility Overal bed mobility: Independent Bed Mobility: Sit to Supine, Supine to Sit     Supine to sit: Independent Sit to supine: Independent        Transfers   Equipment used:  (gait belt) Transfers: Sit to/from Stand Sit to Stand: Supervision                  Balance Overall balance assessment: Needs assistance Sitting-balance support: Feet supported Sitting balance-Leahy Scale: Good     Standing balance support: No upper extremity supported, During functional activity Standing balance-Leahy Scale: Fair                             ADL either performed or assessed with clinical judgement   ADL Overall ADL's : Needs assistance/impaired     Grooming: Min guard  Toileting- Clothing Manipulation and Hygiene: Supervision/safety;Min guard         General ADL Comments: Pt required Min guard/supervision for all ADLs.           Extremity/Trunk Assessment Upper Extremity Assessment Upper Extremity Assessment: Overall WFL for tasks assessed   Lower Extremity Assessment Lower Extremity Assessment: Generalized weakness   Cervical / Trunk Assessment Cervical / Trunk  Assessment: Normal   Communication Communication Communication: No difficulties   Cognition Arousal/Alertness: Awake/alert Behavior During Therapy: WFL for tasks assessed/performed Overall Cognitive Status: Impaired/Different from baseline Area of Impairment: Orientation                 Orientation Level: Disoriented to, Place                              Home Living Family/patient expects to be discharged to:: Private residence Living Arrangements: Alone Available Help at Discharge: Family;Available 24 hours/day Type of Home: House Home Access: Level entry     Home Layout: One level     Bathroom Shower/Tub: Occupational psychologist: Standard Bathroom Accessibility: Yes   Home Equipment: None          Prior Functioning/Environment Prior Level of Function : Independent/Modified Independent;Driving;History of Falls (last six months)             Mobility Comments: patient was fully independent ADLs Comments: fully independent pta.        OT Problem List: Decreased strength;Decreased activity tolerance;Decreased cognition;Decreased safety awareness;Impaired balance (sitting and/or standing)         OT Goals(Current goals can be found in the care plan section) Acute Rehab OT Goals Patient Stated Goal: to return home OT Goal Formulation: With patient/family Time For Goal Achievement: 02/26/22 Potential to Achieve Goals: Fair ADL Goals Pt Will Perform Grooming: with set-up Pt Will Perform Lower Body Dressing: with min assist Pt Will Transfer to Toilet: with min guard assist Pt Will Perform Toileting - Clothing Manipulation and hygiene: with min guard assist  OT Frequency: Min 2X/week       AM-PAC OT "6 Clicks" Daily Activity     Outcome Measure Help from another person eating meals?: None Help from another person taking care of personal grooming?: A Little Help from another person toileting, which includes using toliet, bedpan,  or urinal?: A Little Help from another person bathing (including washing, rinsing, drying)?: A Little Help from another person to put on and taking off regular upper body clothing?: A Little Help from another person to put on and taking off regular lower body clothing?: A Little 6 Click Score: 19   End of Session Equipment Utilized During Treatment: Gait belt  Activity Tolerance: Patient tolerated treatment well Patient left: in bed;with call bell/phone within reach;with bed alarm set;with family/visitor present  OT Visit Diagnosis: Muscle weakness (generalized) (M62.81);History of falling (Z91.81);Unsteadiness on feet (R26.81);Cognitive communication deficit (R41.841)                Time: 9373-4287 OT Time Calculation (min): 16 min Charges:       Tomasa Blase, OTS 02/12/2022, 3:30 PM

## 2022-02-13 ENCOUNTER — Inpatient Hospital Stay: Payer: PPO | Admitting: Medical Oncology

## 2022-02-13 ENCOUNTER — Inpatient Hospital Stay: Payer: PPO

## 2022-02-13 ENCOUNTER — Other Ambulatory Visit: Payer: PPO

## 2022-02-13 DIAGNOSIS — R509 Fever, unspecified: Secondary | ICD-10-CM

## 2022-02-13 DIAGNOSIS — G9341 Metabolic encephalopathy: Secondary | ICD-10-CM | POA: Diagnosis not present

## 2022-02-13 DIAGNOSIS — N390 Urinary tract infection, site not specified: Secondary | ICD-10-CM | POA: Diagnosis present

## 2022-02-13 DIAGNOSIS — E871 Hypo-osmolality and hyponatremia: Secondary | ICD-10-CM | POA: Diagnosis not present

## 2022-02-13 LAB — BASIC METABOLIC PANEL
Anion gap: 9 (ref 5–15)
BUN: 19 mg/dL (ref 8–23)
CO2: 22 mmol/L (ref 22–32)
Calcium: 8.8 mg/dL — ABNORMAL LOW (ref 8.9–10.3)
Chloride: 101 mmol/L (ref 98–111)
Creatinine, Ser: 1.11 mg/dL (ref 0.61–1.24)
GFR, Estimated: >60 mL/min (ref >60)
Glucose, Bld: 116 mg/dL — ABNORMAL HIGH (ref 70–99)
Potassium: 3.8 mmol/L (ref 3.5–5.1)
Sodium: 132 mmol/L — ABNORMAL LOW (ref 135–145)

## 2022-02-13 LAB — ANA W/REFLEX: Anti Nuclear Antibody (ANA): NEGATIVE

## 2022-02-13 LAB — CBC
HCT: 43.7 % (ref 39.0–52.0)
Hemoglobin: 15 g/dL (ref 13.0–17.0)
MCH: 30.2 pg (ref 26.0–34.0)
MCHC: 34.3 g/dL (ref 30.0–36.0)
MCV: 88.1 fL (ref 80.0–100.0)
Platelets: 192 10*3/uL (ref 150–400)
RBC: 4.96 MIL/uL (ref 4.22–5.81)
RDW: 12.5 % (ref 11.5–15.5)
WBC: 10.6 10*3/uL — ABNORMAL HIGH (ref 4.0–10.5)
nRBC: 0 % (ref 0.0–0.2)

## 2022-02-13 LAB — URINE CULTURE: Culture: 70000 — AB

## 2022-02-13 MED ORDER — ENOXAPARIN SODIUM 60 MG/0.6ML IJ SOSY
0.5000 mg/kg | PREFILLED_SYRINGE | INTRAMUSCULAR | Status: DC
  Administered 2022-02-13: 47.5 mg via SUBCUTANEOUS
  Filled 2022-02-13: qty 0.6

## 2022-02-13 MED ORDER — AMOXICILLIN-POT CLAVULANATE 875-125 MG PO TABS: 1.0000 | ORAL_TABLET | Freq: Two times a day (BID) | ORAL | Status: DC

## 2022-02-13 NOTE — Progress Notes (Signed)
Pt weaned down to RA sat 92-94%. Incentive spirometer pt scoring 2500 mark.

## 2022-02-13 NOTE — Assessment & Plan Note (Signed)
Likely due to UTI.  Pt had reported fever at home. Tmax overnight 100.7. Started on Rocephin for UTI.

## 2022-02-13 NOTE — Assessment & Plan Note (Addendum)
Presented with confusion and disorientation / encephalopathy.  UA had only rare bacteria, but culture grew Proteus mirabilis. --Treated with Rocephin --Discharge with Augmentin for UTI and sinus infection coverage

## 2022-02-13 NOTE — Progress Notes (Addendum)
Progress Note   Patient: Randall Ali. WUJ:[id number] DOB: 07/04/1940 DOA: 02/11/2022     1 DOS: the patient was seen and examined on 02/13/2022   Brief hospital course: 81 year old male with past medical history of non-small cell lung cancer on Keytruda, CAD, HTN, R subclavian stenosis, PAD, OSA, BPH, lumbar spondylosis who presented to the ED on 02/11/2022 for evaluation of confusion with disorientation.  Pt was seen by oncology and noted to have fever 100.6.  Pt's only complaint was mild sore throat.    ED course - head CT non-acute.  Chest xray ILD pattern, nothing acute.  Hypoxic with spO2 in upper 80's on room air, with new requirement for 2 L/min supplemental oxygen.  Labs with mild hyponatremia, mild hypokalemia.  UA was clear, no infection.    On admission, pt oriented to self, but not time, place or situation.  Family report at baseline pt is fully oriented and ambulates with assist device.  He had recently completed a Z-pack and remains on a prednisone taper given for apparently sinusitis and 'breathing issues'.   Assessment and Plan: * Acute metabolic encephalopathy Suspect due to UTI. UA with rare only bacteria but no urine culture was initially obtained.  No other metabolic derangements or evidence of infection found. CXR and CTA chest without focal infiltrates, show chronic ILD changes.   Respiratory panel negative.   Flu and Covid-19 negative.  PCO2 on VBG was 38.   Ammonia level normal.  No focal neurologic deficits and CT head negative.  MRI brain was limited by motion artifact but no acute or subacute infarct was seen. Typically tolerates prednisone without confusion, per family. --Urine culture positive consistent wit UTI, not on antibiotics and improving --Delirium precautions --Mgmt of other issues as outlined --Monitor closely --Neuro checks  Confusion and disorientation ? If this is related to his chronic illness vs acute upper respiratory infection. No  evidence of infection so far.  Nothing on CT head or MRI brain to explain this.   --EEG pending --Avoid sedating meds as much as possible ? If this is related to tramadol.    Acute sinusitis Patient is status post antibiotic therapy and prednisone which we will continue. Continue Flonase.  Elevated lactic acid level Lactic acid trended 1.4 >> 2.0 --Repeat lactate level --Can stop fluids if normalized and tolerating PO intake  Nasal sinus congestion Pt has sore throat, some congestion which is mildly improved since Z-pack. Still on prednisone prescribed outpatient --Continue prednisone, Flonase --Monitor  Hyponatremia Mild, Na 132 today, down from 135.  Suspect due to poor PO intake with AMS and acute illness. --Encourage PO hydration --Repeat BMP tomorrow AM  Fever Likely due to UTI.  Pt had reported fever at home. Tmax overnight 100.7. Started on Rocephin for UTI.  UTI (urinary tract infection) Presented with confusion and disorientation / encephalopathy.  UA had only rare bacteria, but culture grew Proteus mirabilis. --Continue Rocephin  Obesity (BMI 30-39.9) Body mass index is 31.93 kg/m. Complicates overall care and prognosis.  Recommend lifestyle modifications including physical activity and diet for weight loss and overall long-term health.   Cancer of lower lobe of left lung ([place]) Pt f/u with oncology. Dr Renae Fickle.  Asymptomatic.  CT shows abnormal findings but stable.  IMPRESSION: "No definite evidence of pulmonary embolus.  Small sliding-type hiatal hernia.  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 Stable treated lesion is again noted in superior segment  of left lower lobe. Status post coronary bypass graft. Aortic Atherosclerosis (ICD10-I70.0)."  BPH (benign prostatic hyperplasia) Continue Proscar    OSA (obstructive sleep apnea) CPAP per home settings per  respiratory therapist.  Essential hypertension Continue home regimen          Subjective: Pt awake resting in bed when seen today.  He reported feeling better today, feels his thinking is better.  He is able to tell me we are in hospital and the current year, could not do this yesterday.  Reported having recent suprapubic pain that is better today.  No other acute complaints at this time.     Physical Exam: Vitals:   02/12/22 2109 02/13/22 0000 02/13/22 0506 02/13/22 0831  BP: (!) 124/57  (!) 125/53 101/60  Pulse: 75  64 76  Resp: 18  20 18   Temp: (!) 100.7 F (38.2 C) 99.5 F (37.5 C) 99 F (37.2 C) 98.5 F (36.9 C)  TempSrc: Oral Oral Oral   SpO2: 93%  95% 97%  Weight:       General exam: awake, alert, no acute distress HEENT: atraumatic, clear conjunctiva, anicteric sclera, moist mucus membranes, hearing grossly normal  Respiratory system: CTAB, no wheezes, rales or rhonchi, normal respiratory effort. Cardiovascular system: normal S1/S2, RRR, no pedal edema.   Central nervous system: A&O x3. no gross focal neurologic deficits, normal speech Extremities: moves all, no edema, normal tone Skin: dry, intact, normal temperature Psychiatry: normal mood, congruent affect, abnormal judgement and insight   Data Reviewed: Notable labs & micro --- urine cultures with GNR's proteus mirabilis.  Na 132, Glucose 116, Ca 8.8, negative procal, lactate repeat yesterday 1.8, wbc 10.6k  Temp 100.7 around 9pm last night , noted.    Family Communication: Daughter and son at bedside on rounds on 9/6.  None present today, will attempt to call.  Disposition: Status is: Inpatient Remains inpatient appropriate because: Remains on IV antibiotics until at least 24 hours afebrile and further improvement in mental status.   Planned Discharge Destination: Home with Home Health    Time spent: 40 minutes  Author: Ezekiel Slocumb, DO 02/13/2022 3:59 PM  For on call review  [email or web address].

## 2022-02-13 NOTE — TOC Progression Note (Signed)
Transition of Care (TOC) - Progression Note    Patient Details  Name: Randall Ali. MRN: [id number] Date of Birth: 11/18/1940  Transition of Care [facility]) CM/SW Contact  Laurena Slimmer, RN Phone Number: 02/13/2022, 11:56 PM  Clinical Narrative:    PT/OT recommendation for [facility] PT/OT. Referral sent to [facility].        Expected Discharge Plan and Services                                                 Social Determinants of Health (SDOH) Interventions    Readmission Risk Interventions     No data to display

## 2022-02-13 NOTE — Assessment & Plan Note (Signed)
Mild, Na 132 today, down from 135.  Suspect due to poor PO intake with AMS and acute illness. --Encourage PO hydration --Repeat BMP tomorrow AM

## 2022-02-14 DIAGNOSIS — R410 Disorientation, unspecified: Secondary | ICD-10-CM | POA: Diagnosis not present

## 2022-02-14 DIAGNOSIS — N3000 Acute cystitis without hematuria: Secondary | ICD-10-CM

## 2022-02-14 DIAGNOSIS — R4182 Altered mental status, unspecified: Secondary | ICD-10-CM | POA: Diagnosis not present

## 2022-02-14 DIAGNOSIS — J329 Chronic sinusitis, unspecified: Secondary | ICD-10-CM

## 2022-02-14 LAB — BASIC METABOLIC PANEL
Anion gap: 8 (ref 5–15)
BUN: 22 mg/dL (ref 8–23)
CO2: 23 mmol/L (ref 22–32)
Calcium: 9.1 mg/dL (ref 8.9–10.3)
Chloride: 102 mmol/L (ref 98–111)
Creatinine, Ser: 1.05 mg/dL (ref 0.61–1.24)
GFR, Estimated: >60 mL/min (ref >60)
Glucose, Bld: 118 mg/dL — ABNORMAL HIGH (ref 70–99)
Potassium: 4 mmol/L (ref 3.5–5.1)
Sodium: 133 mmol/L — ABNORMAL LOW (ref 135–145)

## 2022-02-14 LAB — CBC
HCT: 44.1 % (ref 39.0–52.0)
Hemoglobin: 15.1 g/dL (ref 13.0–17.0)
MCH: 29.9 pg (ref 26.0–34.0)
MCHC: 34.2 g/dL (ref 30.0–36.0)
MCV: 87.3 fL (ref 80.0–100.0)
Platelets: 206 10*3/uL (ref 150–400)
RBC: 5.05 MIL/uL (ref 4.22–5.81)
RDW: 12.5 % (ref 11.5–15.5)
WBC: 13.7 10*3/uL — ABNORMAL HIGH (ref 4.0–10.5)
nRBC: 0 % (ref 0.0–0.2)

## 2022-02-14 MED ORDER — VITAMIN D3 50 MCG (2000 UT) PO TABS: 2000.0000 [IU] | ORAL_TABLET | Freq: Every day | ORAL | Status: AC

## 2022-02-14 MED ORDER — AMOXICILLIN-POT CLAVULANATE 875-125 MG PO TABS: 1.0000 | ORAL_TABLET | Freq: Two times a day (BID) | ORAL | 0 refills | Status: AC

## 2022-02-14 MED ORDER — VITAMIN C 1000 MG PO TABS: 1000.0000 mg | ORAL_TABLET | Freq: Every day | ORAL | Status: AC

## 2022-02-14 MED ORDER — HEPARIN SOD (PORK) LOCK FLUSH 100 UNIT/ML IV SOLN
500.0000 [IU] | Freq: Once | INTRAVENOUS | Status: AC
  Administered 2022-02-14: 500 [IU] via INTRAVENOUS
  Filled 2022-02-14: qty 5

## 2022-02-14 MED ORDER — PROCHLORPERAZINE MALEATE 10 MG PO TABS: 10.0000 mg | ORAL_TABLET | Freq: Four times a day (QID) | ORAL | 1 refills | Status: DC | PRN

## 2022-02-14 MED ORDER — B-12 5000 MCG PO CAPS: 5000.0000 ug | ORAL_CAPSULE | Freq: Every day | ORAL | Status: AC

## 2022-02-14 MED ORDER — FLUTICASONE PROPIONATE 50 MCG/ACT NA SUSP: 2.0000 | Freq: Every day | NASAL | Status: AC

## 2022-02-14 NOTE — Progress Notes (Signed)
Pt discharged home with home health. Son present at bedside to take patient home. Discharge packet reviewed with patient, verbalizes understanding. Port deaccessed.

## 2022-02-14 NOTE — Care Management Important Message (Signed)
Important Message  Patient Details  Name: Randall Ali. MRN: [id number] Date of Birth: 01-21-1941   Medicare Important Message Given:  N/A - LOS <3 / Initial given by admissions     Juliann Pulse A Renatta Shrieves 02/14/2022, 7:39 AM

## 2022-02-14 NOTE — Plan of Care (Signed)
  Problem: Education: Goal: Knowledge of General Education information will improve Description: Including pain rating scale, medication(s)/side effects and non-pharmacologic comfort measures 02/14/2022 1121 by Mancel Bale, RN Outcome: Completed/Met 02/14/2022 0948 by Mancel Bale, RN Outcome: Progressing   Problem: Health Behavior/Discharge Planning: Goal: Ability to manage health-related needs will improve 02/14/2022 1121 by Mancel Bale, RN Outcome: Completed/Met 02/14/2022 0948 by Mancel Bale, RN Outcome: Progressing   Problem: Clinical Measurements: Goal: Ability to maintain clinical measurements within normal limits will improve 02/14/2022 1121 by Mancel Bale, RN Outcome: Completed/Met 02/14/2022 0948 by Mancel Bale, RN Outcome: Progressing Goal: Will remain free from infection 02/14/2022 1121 by Mancel Bale, RN Outcome: Completed/Met 02/14/2022 0948 by Mancel Bale, RN Outcome: Progressing Goal: Diagnostic test results will improve 02/14/2022 1121 by Mancel Bale, RN Outcome: Completed/Met 02/14/2022 0948 by Mancel Bale, RN Outcome: Progressing Goal: Respiratory complications will improve 02/14/2022 1121 by Mancel Bale, RN Outcome: Completed/Met 02/14/2022 0948 by Mancel Bale, RN Outcome: Progressing Goal: Cardiovascular complication will be avoided 02/14/2022 1121 by Mancel Bale, RN Outcome: Completed/Met 02/14/2022 0948 by Mancel Bale, RN Outcome: Progressing   Problem: Activity: Goal: Risk for activity intolerance will decrease 02/14/2022 1121 by Mancel Bale, RN Outcome: Completed/Met 02/14/2022 0948 by Mancel Bale, RN Outcome: Progressing   Problem: Nutrition: Goal: Adequate nutrition will be maintained 02/14/2022 1121 by Mancel Bale, RN Outcome: Completed/Met 02/14/2022 0948 by Mancel Bale, RN Outcome: Progressing   Problem: Coping: Goal: Level of anxiety will decrease 02/14/2022 1121 by Mancel Bale, RN Outcome: Completed/Met 02/14/2022 0948 by Mancel Bale, RN Outcome: Progressing   Problem: Elimination: Goal: Will not experience complications related to bowel motility 02/14/2022 1121 by Mancel Bale, RN Outcome: Completed/Met 02/14/2022 0948 by Mancel Bale, RN Outcome: Progressing Goal: Will not experience complications related to urinary retention 02/14/2022 1121 by Mancel Bale, RN Outcome: Completed/Met 02/14/2022 0948 by Mancel Bale, RN Outcome: Progressing   Problem: Pain Managment: Goal: General experience of comfort will improve 02/14/2022 1121 by Mancel Bale, RN Outcome: Completed/Met 02/14/2022 0948 by Mancel Bale, RN Outcome: Progressing   Problem: Safety: Goal: Ability to remain free from injury will improve 02/14/2022 1121 by Mancel Bale, RN Outcome: Completed/Met 02/14/2022 0948 by Mancel Bale, RN Outcome: Progressing   Problem: Skin Integrity: Goal: Risk for impaired skin integrity will decrease 02/14/2022 1121 by Mancel Bale, RN Outcome: Completed/Met 02/14/2022 0948 by Mancel Bale, RN Outcome: Progressing

## 2022-02-14 NOTE — Plan of Care (Signed)

## 2022-02-14 NOTE — Discharge Summary (Signed)
Physician Discharge Summary   Patient: Randall Ali. MRN: [id number] DOB: 09-Feb-1941  Admit date:     02/11/2022  Discharge date: 02/18/22  Discharge Physician: Ezekiel Slocumb   PCP: Ria Bush, MD   Recommendations at discharge:    Follow up with Primary Care in 1-2 weeks Follow up with Pulmonology on 02/28/22 at 10:30 AM Follow up with Oncology as scheduled Repeat CBC, BMP in 1-2 weeks   Discharge Diagnoses: Principal Problem:   AMS (altered mental status) Active Problems:   Recurrent sinusitis   Confusion and disorientation   Nasal sinus congestion   Elevated lactic acid level   Essential hypertension   OSA (obstructive sleep apnea)   BPH (benign prostatic hyperplasia)   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  Pt was seen by oncology and noted to have fever 100.6.  Pt's only complaint was mild sore throat.    ED course - head CT non-acute.  Chest xray ILD pattern, nothing acute.  Hypoxic with spO2 in upper 80's on room air, with new requirement for 2 L/min supplemental oxygen.  Labs with mild hyponatremia, mild hypokalemia.  UA was clear, no infection.    On admission, pt oriented to self, but not time, place or situation.  Family report at baseline pt is fully oriented and ambulates with assist device.  He had recently completed a Z-pack and remains on a prednisone taper given for apparently sinusitis and 'breathing issues'.   Assessment and Plan: * AMS (altered mental status) Suspect due to UTI. UA with rare only bacteria but no urine culture was initially obtained.  Urine culture obtained  and positive.  Patient was treated with antibiotics and clinically improved to his baseline.  No other metabolic derangements or evidence of infection found. CXR and CTA chest without focal infiltrates, show chronic ILD changes.   Respiratory panel negative.   Flu and Covid-19 negative.  PCO2 on VBG was 38.   Ammonia level normal.  No focal neurologic deficits and CT head negative.  MRI brain was limited by motion artifact but no acute or subacute infarct was seen. Typically tolerates prednisone without confusion, per family.   Confusion and disorientation ? If this is related to his chronic illness vs acute upper respiratory infection. No evidence of infection so far.  Nothing on CT head or MRI brain to explain this.   --EEG showed moderate diffuse encephalopathy, non-specific --? If side effect of tramadol contributed per family  Recurrent sinusitis Patient is status post antibiotic therapy and prednisone which we will continue. Continue Flonase.  Elevated lactic acid level Lactic acid trended 1.4 >> 2.0 >> 1.8 normalized Treated with IV fluids.  Due to infection and dehydration  Nasal sinus congestion Pt has sore throat, some congestion which is mildly improved since Z-pack. Still on prednisone prescribed outpatient --Continue prednisone, Flonase --Monitor  UTI (urinary tract infection) Presented with confusion and disorientation / encephalopathy.  UA had only rare bacteria, but culture grew Proteus mirabilis. --Treated with Rocephin --Discharge with Augmentin for UTI and sinus infection coverage  Obesity (BMI 30-39.9) Body mass index is 31.93 kg/m. Complicates  overall care and prognosis.  Recommend lifestyle modifications including physical activity and diet for weight loss and overall long-term health.   Cancer of lower lobe of left lung ([place]) Pt f/u with oncology. Dr Renae Fickle.  Asymptomatic.  CT shows abnormal findings but stable.  IMPRESSION: "No definite  evidence of pulmonary embolus.  Small sliding-type hiatal hernia.  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 Stable treated lesion is again noted in superior segment of left lower lobe. Status post coronary bypass graft. Aortic Atherosclerosis (ICD10-I70.0)."  BPH (benign prostatic hyperplasia) Continue Proscar    OSA (obstructive sleep apnea) CPAP per home settings per respiratory therapist.  Essential hypertension Continue home regimen    Hyponatremia-resolved as of 02/18/2022 Mild, Na 132 today, down from 135.  Suspect due to poor PO intake with AMS and acute illness. --Encourage PO hydration --Repeat BMP tomorrow AM  Fever-resolved as of 02/18/2022 Likely due to UTI.  Pt had reported fever at home. Tmax overnight 100.7. Started on Rocephin for UTI.         Consultants: None  Procedures performed: EEG  Disposition: Home Diet recommendation:  Cardiac diet DISCHARGE MEDICATION: Allergies as of 02/14/2022   No Known Allergies      Medication List     TAKE these medications    acetaminophen 325 MG tablet Commonly known as: TYLENOL Take 650 mg by mouth as needed for moderate pain or mild pain.   albuterol 108 (90 Base) MCG/ACT inhaler Commonly known as: VENTOLIN HFA Inhale 2 puffs into the lungs every 6 (six) hours as needed for wheezing or shortness of breath.   amoxicillin-clavulanate 875-125 MG tablet Commonly known as: AUGMENTIN Take 1 tablet by mouth every 12 (twelve) hours for 4 days.   aspirin EC 81 MG tablet Take 81 mg by mouth daily.   B-12 5000 MCG Caps Take 5,000 mcg by mouth daily.   CALCIUM 1200+D3 PO Take 1 tablet by mouth daily.   docusate sodium 100 MG capsule Commonly known as: COLACE Take 200 mg by mouth at bedtime.   finasteride 5 MG tablet Commonly known as: Proscar Take 1 tablet (5 mg total) by mouth daily.   fluticasone 50 MCG/ACT nasal  spray Commonly known as: FLONASE Place 2 sprays into both nostrils daily. What changed:  how much to take when to take this   lidocaine-prilocaine cream Commonly known as: EMLA Apply 30 -45 mins prior to port access.   loratadine 10 MG tablet Commonly known as: CLARITIN Take 10 mg by mouth daily.   LUBRICATING EYE DROPS OP Place 1 drop into both eyes daily as needed (irritation).   Melatonin 10 MG Caps Take 10 mg by mouth at bedtime as needed (sleep).   nitroGLYCERIN 0.4 MG SL tablet Commonly known as: NITROSTAT Place 1 tablet (0.4 mg total) under the tongue every 5 (five) minutes as needed for up to 25 doses for chest pain.   olopatadine 0.1 % ophthalmic solution Commonly known as: Patanol Place 1 drop into both eyes 2 (two) times daily.   pantoprazole 40 MG tablet Commonly known as: PROTONIX TAKE 1 TABLET BY MOUTH DAILY What changed: when to take this   predniSONE 20 MG tablet Commonly known as: DELTASONE Take 1 tablet (20 mg total) by mouth daily with breakfast. Take 3 pills once a day for 1 week; and then 2 pills once a day for 1 week; then 1 pill a day.   prochlorperazine 10 MG tablet  Commonly known as: COMPAZINE Take 1 tablet (10 mg total) by mouth every 6 (six) hours as needed for nausea or vomiting.   simvastatin 40 MG tablet Commonly known as: ZOCOR Take 1 tablet (40 mg total) by mouth at bedtime.   traMADol 50 MG tablet Commonly known as: ULTRAM TAKE 1 TABLET BY MOUTH 3 TIMES DAILY AS NEEDED FOR MODERATE PAIN   valsartan 80 MG tablet Commonly known as: Diovan One daily What changed:  how much to take how to take this when to take this additional instructions   vitamin C 1000 MG tablet Take 1 tablet (1,000 mg total) by mouth daily.   Vitamin D3 50 MCG (2000 UT) Tabs Take 2,000 Units by mouth daily.   Vitamin E 450 MG (1000 UT) Caps Take 450 Units by mouth daily.   Zinc 50 MG Tabs Take 50 mg by mouth daily.        Discharge  Exam: Filed Weights   02/11/22 1506 02/14/22 0500  Weight: 95.3 kg 90.7 kg   General exam: awake, alert, no acute distress HEENT: atraumatic, clear conjunctiva, anicteric sclera, moist mucus membranes, hearing grossly normal  Respiratory system: CTAB, no wheezes, rales or rhonchi, normal respiratory effort. Cardiovascular system: normal S1/S2, RRR, no JVD, murmurs, rubs, gallops,  no pedal edema.   Gastrointestinal system: soft, NT, ND, no HSM felt, +bowel sounds. Central nervous system: A&O x3. no gross focal neurologic deficits, normal speech Extremities: moves all, no edema, normal tone Skin: dry, intact, normal temperature, normal color, No rashes, lesions or ulcers Psychiatry: normal mood, congruent affect, judgement and insight appear normal   Condition at discharge: stable  The results of significant diagnostics from this hospitalization (including imaging, microbiology, ancillary and laboratory) are listed below for reference.   Imaging Studies: EEG adult  Result Date: 2022-03-10 Lora Havens, MD     10-Mar-2022  7:51 PM Patient Name: Jhamal Plucinski. MRN: [id number] Epilepsy Attending: Lora Havens Referring Physician/Provider: Para Skeans, MD Date: 03-10-22 Duration: 22.31 mins Patient history: 81yo M with ams. EEG to evaluate for seizure. Level of alertness: Awake AEDs during EEG study: None Technical aspects: This EEG study was done with scalp electrodes positioned according to the 10-20 International system of electrode placement. Electrical activity was reviewed with band pass filter of 1-70Hz , sensitivity of 7 uV/mm, display speed of 52mm/sec with a 60Hz  notched filter applied as appropriate. EEG data were recorded continuously and digitally stored.  Video monitoring was available and reviewed as appropriate. Description: The posterior dominant rhythm consists of 7.5 Hz activity of moderate voltage (25-35 uV) seen predominantly in posterior head regions, symmetric and  reactive to eye opening and eye closing. EEG showed continuous generalized 3 to 6 Hz theta-delta slowing. Physiologic photic driving was not seen during photic stimulation. Hyperventilation was not performed.   ABNORMALITY - Continuous slow, generalized IMPRESSION: This study is suggestive of moderate diffuse encephalopathy, nonspecific etiology. No seizures or epileptiform discharges were seen throughout the recording. Lora Havens   MR BRAIN WO CONTRAST  Result Date: 02/11/2022 CLINICAL DATA:  Altered mental status EXAM: MRI HEAD WITHOUT CONTRAST TECHNIQUE: Multiplanar, multiecho pulse sequences of the brain and surrounding structures were obtained without intravenous contrast. COMPARISON:  02/14/2021 FINDINGS: Evaluation is somewhat limited by motion artifact. Brain: No restricted diffusion to suggest acute or subacute infarct. No acute hemorrhage, mass, mass effect, or midline shift. No hydrocephalus or extra-axial collection. No hemosiderin deposition to suggest remote hemorrhage. Scattered T2 hyperintense signal in  the periventricular white matter, likely the sequela of mild chronic small vessel ischemic disease. Vascular: Normal flow voids. Skull and upper cervical spine: Normal marrow signal. Sinuses/Orbits: No acute finding. Status post bilateral lens replacements. Other: The mastoids are well aerated. IMPRESSION: Evaluation is somewhat limited by motion artifact. Within this limitation, no acute intracranial process. No evidence of acute or subacute infarct. Electronically Signed   By: Merilyn Baba M.D.   On: 02/11/2022 21:53   CT Angio Chest Pulmonary Embolism (PE) W or WO Contrast  Result Date: 02/11/2022 CLINICAL DATA:  Altered mental status. EXAM: CT ANGIOGRAPHY CHEST WITH CONTRAST TECHNIQUE: Multidetector CT imaging of the chest was performed using the standard protocol during bolus administration of intravenous contrast. Multiplanar CT image reconstructions and MIPs were obtained to  evaluate the vascular anatomy. RADIATION DOSE REDUCTION: This exam was performed according to the departmental dose-optimization program which includes automated exposure control, adjustment of the mA and/or kV according to patient size and/or use of iterative reconstruction technique. CONTRAST:  115mL OMNIPAQUE IOHEXOL 350 MG/ML SOLN COMPARISON:  January 31, 2022. FINDINGS: Cardiovascular: Satisfactory opacification of the pulmonary arteries to the segmental level. No evidence of pulmonary embolism. Normal heart size. No pericardial effusion. Status post coronary artery bypass graft. Atherosclerosis of thoracic aorta is noted without aneurysm or dissection. Mediastinum/Nodes: Small sliding-type hiatal hernia is noted. No adenopathy is noted. Thyroid gland is unremarkable. Lungs/Pleura: No pneumothorax or pleural effusion is noted. Mild bibasilar reticular and interstitial densities are noted which may represent chronic interstitial lung disease. Stable treated lesion is again noted in superior segment of left lower lobe. Upper Abdomen: No acute abnormality. Musculoskeletal: No chest wall abnormality. No acute or significant osseous findings. Review of the MIP images confirms the above findings. IMPRESSION: No definite evidence of pulmonary embolus. Small sliding-type hiatal hernia. 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 Stable treated lesion is again noted in superior segment of left lower lobe. Status post coronary bypass graft. Aortic Atherosclerosis (ICD10-I70.0). Electronically Signed   By: Marijo Conception M.D.   On: 02/11/2022 19:34   CT Head W or Wo Contrast  Result Date: 02/11/2022 CLINICAL DATA:  Delirium altered mental,  lung cancer patient EXAM: CT HEAD WITHOUT AND WITH CONTRAST TECHNIQUE: Contiguous axial images were obtained from the base of the skull through the vertex without and with intravenous contrast. RADIATION  DOSE REDUCTION: This exam was performed according to the departmental dose-optimization program which includes automated exposure control, adjustment of the mA and/or kV according to patient size and/or use of iterative reconstruction technique. CONTRAST:  43mL OMNIPAQUE IOHEXOL 300 MG/ML  SOLN COMPARISON:  MRI head February 14, 2021. FINDINGS: Brain: No evidence of acute infarction, hemorrhage, hydrocephalus, extra-axial collection or mass lesion/mass effect. No pathologic enhancement Vascular: No hyperdense vessel identified. Skull: No acute fracture. Sinuses/Orbits: Clear visualized sinuses. No acute orbital findings. Other: No mastoid effusions. IMPRESSION: No evidence of acute intracranial abnormality or metastatic disease. An MRI with contrast could provide more sensitive evaluation if clinically warranted. Electronically Signed   By: Margaretha Sheffield M.D.   On: 02/11/2022 17:28   DG Chest 2 View  Result Date: 02/11/2022 CLINICAL DATA:  Suspected sepsis.  Altered mental status. EXAM: CHEST - 2 VIEW COMPARISON:  Chest radiograph and chest CT January 31, 2022. FINDINGS: The heart size and mediastinal contours are unchanged. Accessed right chest Port-A-Cath with tip at the superior cavoatrial junction. Prior median sternotomy and CABG. Aortic atherosclerosis. Similar  basilar predominant coarse reticular and reticulonodular opacities again most suggestive of interstitial lung disease. No new focal airspace consolidation. Multilevel degenerative changes spine. IMPRESSION: Chronic parenchymal lung changes appear similar to prior without new focal airspace consolidation. Aortic Atherosclerosis (ICD10-I70.0). Electronically Signed   By: Dahlia Bailiff M.D.   On: 02/11/2022 16:32   CT Angio Chest Pulmonary Embolism (PE) W or WO Contrast  Result Date: 01/31/2022 CLINICAL DATA:  81 year old male with history of positive D-dimer. Worsening shortness of breath for 1 month. Evaluate for pulmonary embolism. History of  non-small cell lung cancer. * Tracking Code: BO * EXAM: CT ANGIOGRAPHY CHEST WITH CONTRAST TECHNIQUE: Multidetector CT imaging of the chest was performed using the standard protocol during bolus administration of intravenous contrast. Multiplanar CT image reconstructions and MIPs were obtained to evaluate the vascular anatomy. RADIATION DOSE REDUCTION: This exam was performed according to the departmental dose-optimization program which includes automated exposure control, adjustment of the mA and/or kV according to patient size and/or use of iterative reconstruction technique. CONTRAST:  60mL OMNIPAQUE IOHEXOL 350 MG/ML SOLN COMPARISON:  Chest CT 11/01/2021. FINDINGS: Cardiovascular: There are no filling defects within the pulmonary arterial tree to suggest pulmonary embolism. Heart size is borderline enlarged. There is no significant pericardial fluid, thickening or pericardial calcification. There is aortic atherosclerosis, as well as atherosclerosis of the great vessels of the mediastinum and the coronary arteries, including calcified atherosclerotic plaque in the left main, left anterior descending, left circumflex and right coronary arteries. Status post median sternotomy for CABG including LIMA to the LAD. Right internal jugular single-lumen Port-A-Cath with tip terminating at the superior cavoatrial junction. Mediastinum/Nodes: No pathologically enlarged mediastinal or hilar lymph nodes. Esophagus is unremarkable in appearance. No axillary lymphadenopathy. Lungs/Pleura: Linear area of architectural distortion and some cylindrical bronchiectasis noted in the superior segment of the left lower lobe at the site of the treated neoplasm, stable compared to the recent prior examination. No other new suspicious appearing pulmonary nodules or masses are noted. No acute consolidative airspace disease. No pleural effusions. Widespread but patchy areas of ground-glass attenuation and septal thickening are noted  throughout the lungs bilaterally, most evident in the mid to lower lungs. Upper Abdomen: Atherosclerotic calcifications in the abdominal aorta. Exophytic 1.4 cm low-attenuation lesion in the upper pole of the right kidney, incompletely characterized on today's noncontrast CT examination, but statistically likely small cysts (no imaging follow-up is recommended). Musculoskeletal: Median sternotomy wires. There are no aggressive appearing lytic or blastic lesions noted in the visualized portions of the skeleton. Review of the MIP images confirms the above findings. IMPRESSION: 1. No evidence of pulmonary embolism. 2. The treated lesion in the superior segment of the left lower lobe appears stable compared to the prior study. No new suspicious appearing pulmonary nodules or lymphadenopathy noted elsewhere in the thorax. 3. The appearance of the lungs suggests interstitial lung disease. Outpatient referral to Pulmonology for further clinical evaluation is recommended. Follow-up high-resolution chest CT should be considered if clinically appropriate. 4. Aortic atherosclerosis, in addition to left main and three-vessel coronary artery disease. Status post median sternotomy for CABG including LIMA to the LAD. Aortic Atherosclerosis (ICD10-I70.0). Electronically Signed   By: Vinnie Langton M.D.   On: 01/31/2022 10:28   DG Chest 2 View  Result Date: 01/31/2022 CLINICAL DATA:  Short of breath EXAM: CHEST - 2 VIEW COMPARISON:  12/11/2021 FINDINGS: Right chest wall port catheter is unchanged. Progressive interstitial changes. No pleural effusion or pneumothorax. Stable cardiomediastinal contours. No acute osseous abnormality.  IMPRESSION: Progressive interstitial changes may reflect edema or atypical/viral pneumonia. Electronically Signed   By: Macy Mis M.D.   On: 01/31/2022 09:45    Microbiology: Results for orders placed or performed during the hospital encounter of 02/11/22  Culture, blood (Routine x 2)      Status: None   Collection Time: 02/11/22  3:36 PM   Specimen: Right Antecubital; Blood  Result Value Ref Range Status   Specimen Description RIGHT ANTECUBITAL  Final   Special Requests   Final    BOTTLES DRAWN AEROBIC AND ANAEROBIC Blood Culture adequate volume   Culture   Final    NO GROWTH 5 DAYS Performed at [facility], [address] [place], [place] [address]    Report Status 02/16/2022 FINAL  Final  Culture, blood (Routine x 2)     Status: None   Collection Time: 02/11/22  3:44 PM   Specimen: Left Antecubital; Blood  Result Value Ref Range Status   Specimen Description LEFT ANTECUBITAL  Final   Special Requests   Final    BOTTLES DRAWN AEROBIC AND ANAEROBIC Blood Culture adequate volume   Culture   Final    NO GROWTH 5 DAYS Performed at [facility], [address] [place], [place] [address]    Report Status 02/16/2022 FINAL  Final  Resp Panel by RT-PCR (Flu A&B, Covid) Anterior Nasal Swab     Status: None   Collection Time: 02/11/22  4:29 PM   Specimen: Anterior Nasal Swab  Result Value Ref Range Status   SARS Coronavirus 2 by RT PCR NEGATIVE NEGATIVE Final    Comment: (NOTE) SARS-CoV-2 target nucleic acids are NOT DETECTED.  The SARS-CoV-2 RNA is generally detectable in upper respiratory specimens during the acute phase of infection. The lowest concentration of SARS-CoV-2 viral copies this assay can detect is 138 copies/mL. A negative result does not preclude SARS-Cov-2 infection and should not be used as the sole basis for treatment or other patient management decisions. A negative result may occur with  improper specimen collection/handling, submission of specimen other than nasopharyngeal swab, presence of viral mutation(s) within the areas targeted by this assay, and inadequate number of viral copies(<138 copies/mL). A negative result must be combined with clinical observations, patient history, and epidemiological information. The  expected result is Negative.  Fact Sheet for Patients:  EntrepreneurPulse.com.au  Fact Sheet for Healthcare Providers:  IncredibleEmployment.be  This test is no t yet approved or cleared by the [place] FDA and  has been authorized for detection and/or diagnosis of SARS-CoV-2 by FDA under an Emergency Use Authorization (EUA). This EUA will remain  in effect (meaning this test can be used) for the duration of the COVID-19 declaration under Section 564(b)(1) of the Act, 21 U.S.C.section 360bbb-3(b)(1), unless the authorization is terminated  or revoked sooner.       Influenza A by PCR NEGATIVE NEGATIVE Final   Influenza B by PCR NEGATIVE NEGATIVE Final    Comment: (NOTE) The Xpert Xpress SARS-CoV-2/FLU/RSV plus assay is intended as an aid in the diagnosis of influenza from Nasopharyngeal swab specimens and should not be used as a sole basis for treatment. Nasal washings and aspirates are unacceptable for Xpert Xpress SARS-CoV-2/FLU/RSV testing.  Fact Sheet for Patients: EntrepreneurPulse.com.au  Fact Sheet for Healthcare Providers: IncredibleEmployment.be  This test is not yet approved or cleared by the [place] FDA and has been authorized for detection and/or diagnosis of SARS-CoV-2 by FDA under an Emergency Use Authorization (EUA). This EUA  will remain in effect (meaning this test can be used) for the duration of the COVID-19 declaration under Section 564(b)(1) of the Act, 21 U.S.C. section 360bbb-3(b)(1), unless the authorization is terminated or revoked.  Performed at [facility], [place], [place] [address]   Respiratory (~20 pathogens) panel by PCR     Status: None   Collection Time: 02/11/22  4:29 PM   Specimen: Anterior Nasal Swab; Respiratory  Result Value Ref Range Status   Adenovirus NOT DETECTED NOT DETECTED Final   Coronavirus 229E NOT DETECTED NOT  DETECTED Final    Comment: (NOTE) The Coronavirus on the Respiratory Panel, DOES NOT test for the novel  Coronavirus (2019 nCoV)    Coronavirus HKU1 NOT DETECTED NOT DETECTED Final   Coronavirus NL63 NOT DETECTED NOT DETECTED Final   Coronavirus OC43 NOT DETECTED NOT DETECTED Final   Metapneumovirus NOT DETECTED NOT DETECTED Final   Rhinovirus / Enterovirus NOT DETECTED NOT DETECTED Final   Influenza A NOT DETECTED NOT DETECTED Final   Influenza B NOT DETECTED NOT DETECTED Final   Parainfluenza Virus 1 NOT DETECTED NOT DETECTED Final   Parainfluenza Virus 2 NOT DETECTED NOT DETECTED Final   Parainfluenza Virus 3 NOT DETECTED NOT DETECTED Final   Parainfluenza Virus 4 NOT DETECTED NOT DETECTED Final   Respiratory Syncytial Virus NOT DETECTED NOT DETECTED Final   Bordetella pertussis NOT DETECTED NOT DETECTED Final   Bordetella Parapertussis NOT DETECTED NOT DETECTED Final   Chlamydophila pneumoniae NOT DETECTED NOT DETECTED Final   Mycoplasma pneumoniae NOT DETECTED NOT DETECTED Final    Comment: Performed at [facility], [place] [place]. [address], [place], [place] [address]   *Note: Due to a large number of results and/or encounters for the requested time period, some results have not been displayed. A complete set of results can be found in Results Review.    Labs: CBC: Recent Labs  Lab 02/12/22 0525 02/13/22 0410 02/14/22 0532 02/17/22 1152  WBC 10.2 10.6* 13.7* 15.5*  NEUTROABS  --   --   --  13.4*  HGB 15.2 15.0 15.1 15.1  HCT 44.5 43.7 44.1 45.3  MCV 88.8 88.1 87.3 90.9  PLT 202 192 206 308.6   Basic Metabolic Panel: Recent Labs  Lab 02/12/22 0525 02/13/22 0410 02/14/22 0532 02/17/22 1152  NA 135 132* 133* 134*  K 3.8 3.8 4.0 4.3  CL 104 101 102 100  CO2 24 22 23 26   GLUCOSE 111* 116* 118* 113*  BUN 19 19 22  25*  CREATININE 1.09 1.11 1.05 1.41  CALCIUM 8.8* 8.8* 9.1 9.9   Liver Function Tests: Recent Labs  Lab 02/12/22 0525 02/17/22 1152  AST 13* 14   ALT 15 16  ALKPHOS 55 46  BILITOT 0.6 1.2  PROT 6.4* 6.7  ALBUMIN 3.7 3.9   CBG: No results for input(s): "GLUCAP" in the last 168 hours.  Discharge time spent: less than 30 minutes.  Signed: Ezekiel Slocumb, DO [facility] 02/18/2022

## 2022-02-14 NOTE — TOC Transition Note (Signed)
Transition of Care [facility]) - CM/SW Discharge Note   Patient Details  Name: Randall Ali. MRN: [id number] Date of Birth: [birth year]  [facility] CM/SW Contact:  Laurena Slimmer, RN Phone Number: 02/14/2022, 12:01 PM   Clinical Narrative:    Spoke with patient and family at bedside. Patient agreeable to [facility] PT/ OT. No preference. Advised HH referral was sent and accepted by [facility]. Family will transport home. TOC signing off.          Patient Goals and CMS Choice        Discharge Placement                       Discharge Plan and Services                                     Social Determinants of Health (SDOH) Interventions     Readmission Risk Interventions     No data to display

## 2022-02-16 LAB — CULTURE, BLOOD (ROUTINE X 2)
Culture: NO GROWTH
Culture: NO GROWTH
Special Requests: ADEQUATE
Special Requests: ADEQUATE

## 2022-02-17 ENCOUNTER — Ambulatory Visit (INDEPENDENT_AMBULATORY_CARE_PROVIDER_SITE_OTHER): Payer: PPO | Admitting: Family Medicine

## 2022-02-17 ENCOUNTER — Encounter: Payer: Self-pay | Admitting: Family Medicine

## 2022-02-17 ENCOUNTER — Telehealth: Payer: Self-pay | Admitting: *Deleted

## 2022-02-17 VITALS — BP 118/68 | HR 75 | Temp 98.1°F | Ht 68.0 in | Wt 207.0 lb

## 2022-02-17 DIAGNOSIS — C3432 Malignant neoplasm of lower lobe, left bronchus or lung: Secondary | ICD-10-CM | POA: Diagnosis not present

## 2022-02-17 DIAGNOSIS — R519 Headache, unspecified: Secondary | ICD-10-CM | POA: Diagnosis not present

## 2022-02-17 DIAGNOSIS — G4733 Obstructive sleep apnea (adult) (pediatric): Secondary | ICD-10-CM | POA: Diagnosis not present

## 2022-02-17 DIAGNOSIS — J329 Chronic sinusitis, unspecified: Secondary | ICD-10-CM | POA: Diagnosis not present

## 2022-02-17 DIAGNOSIS — R0902 Hypoxemia: Secondary | ICD-10-CM | POA: Diagnosis not present

## 2022-02-17 DIAGNOSIS — R4182 Altered mental status, unspecified: Secondary | ICD-10-CM | POA: Diagnosis not present

## 2022-02-17 DIAGNOSIS — R410 Disorientation, unspecified: Secondary | ICD-10-CM | POA: Diagnosis not present

## 2022-02-17 DIAGNOSIS — J849 Interstitial pulmonary disease, unspecified: Secondary | ICD-10-CM

## 2022-02-17 DIAGNOSIS — N3000 Acute cystitis without hematuria: Secondary | ICD-10-CM | POA: Diagnosis not present

## 2022-02-17 DIAGNOSIS — R0609 Other forms of dyspnea: Secondary | ICD-10-CM | POA: Diagnosis not present

## 2022-02-17 LAB — CBC WITH DIFFERENTIAL/PLATELET
Basophils Absolute: 0 10*3/uL (ref 0.0–0.1)
Basophils Relative: 0.2 % (ref 0.0–3.0)
Eosinophils Absolute: 0 10*3/uL (ref 0.0–0.7)
Eosinophils Relative: 0.1 % (ref 0.0–5.0)
HCT: 45.3 % (ref 39.0–52.0)
Hemoglobin: 15.1 g/dL (ref 13.0–17.0)
Lymphocytes Relative: 6.3 % — ABNORMAL LOW (ref 12.0–46.0)
Lymphs Abs: 1 10*3/uL (ref 0.7–4.0)
MCHC: 33.4 g/dL (ref 30.0–36.0)
MCV: 90.9 fl (ref 78.0–100.0)
Monocytes Absolute: 1 10*3/uL (ref 0.1–1.0)
Monocytes Relative: 6.7 % (ref 3.0–12.0)
Neutro Abs: 13.4 10*3/uL — ABNORMAL HIGH (ref 1.4–7.7)
Neutrophils Relative %: 86.7 % — ABNORMAL HIGH (ref 43.0–77.0)
Platelets: 205 10*3/uL (ref 150.0–400.0)
RBC: 4.98 Mil/uL (ref 4.22–5.81)
RDW: 13.3 % (ref 11.5–15.5)
WBC: 15.5 10*3/uL — ABNORMAL HIGH (ref 4.0–10.5)

## 2022-02-17 LAB — POC URINALSYSI DIPSTICK (AUTOMATED)
Bilirubin, UA: NEGATIVE
Blood, UA: NEGATIVE
Glucose, UA: NEGATIVE
Leukocytes, UA: NEGATIVE
Nitrite, UA: NEGATIVE
Protein, UA: POSITIVE — AB
Spec Grav, UA: >=1.03 — AB (ref 1.010–1.025)
Urobilinogen, UA: 0.2 E.U./dL
pH, UA: 5.5 (ref 5.0–8.0)

## 2022-02-17 LAB — COMPREHENSIVE METABOLIC PANEL
ALT: 16 U/L (ref 0–53)
AST: 14 U/L (ref 0–37)
Albumin: 3.9 g/dL (ref 3.5–5.2)
Alkaline Phosphatase: 46 U/L (ref 39–117)
BUN: 25 mg/dL — ABNORMAL HIGH (ref 6–23)
CO2: 26 mEq/L (ref 19–32)
Calcium: 9.9 mg/dL (ref 8.4–10.5)
Chloride: 100 mEq/L (ref 96–112)
Creatinine, Ser: 1.41 mg/dL (ref 0.40–1.50)
GFR: 47.25 mL/min — ABNORMAL LOW (ref >60.00)
Glucose, Bld: 113 mg/dL — ABNORMAL HIGH (ref 70–99)
Potassium: 4.3 mEq/L (ref 3.5–5.1)
Sodium: 134 mEq/L — ABNORMAL LOW (ref 135–145)
Total Bilirubin: 1.2 mg/dL (ref 0.2–1.2)
Total Protein: 6.7 g/dL (ref 6.0–8.3)

## 2022-02-17 LAB — SEDIMENTATION RATE: Sed Rate: 12 mm/hr (ref 0–20)

## 2022-02-17 NOTE — Patient Outreach (Signed)
  Care Coordination [facility] Note Transition Care Management Follow-up Telephone Call Date of discharge and from where: [facility] [phone] How have you been since you were released from the hospital? Per daughter [name] he is still confused Any questions or concerns? No  Items Reviewed: Did the pt receive and understand the discharge instructions provided? Yes  Daughter does Medications obtained and verified? Yes  Other? No  Any new allergies since your discharge? No  Dietary orders reviewed? No Do you have support at home? Yes   Home Care and Equipment/Supplies: Were home health services ordered? no If so, what is the name of the agency? N/a  Has the agency set up a time to come to the patient's home? not applicable Were any new equipment or medical supplies ordered?  No What is the name of the medical supply agency? N/a Were you able to get the supplies/equipment? not applicable Do you have any questions related to the use of the equipment or supplies? No  Functional Questionnaire: (I = Independent and D = Dependent) ADLs: D  Bathing/Dressing- I  Meal Prep- D  Eating- I  Maintaining continence- I  Transferring/Ambulation- I  Managing Meds- D  Follow up appointments reviewed:  PCP Hospital f/u appt confirmed? Yes  Scheduled to see Dr Danise Mina at [facility] f/u appt confirmed? Yes  Scheduled to see Dr Melvyn Novas [phone] 10:30 Are transportation arrangements needed? No  If their condition worsens, is the pt aware to call PCP or go to the Emergency Dept.? Yes Was the patient provided with contact information for the PCP's office or ED? Yes Was to pt encouraged to call back with questions or concerns? Yes  SDOH assessments and interventions completed:   Yes  Care Coordination Interventions Activated:  Yes   Care Coordination Interventions:   N/A     Encounter Outcome:  Pt. Visit Completed    [facility] [phone]

## 2022-02-17 NOTE — Patient Instructions (Addendum)
Labs today - we will be in touch with lab results.  Urinalysis today.  Walking oxygen test today - your oxygen level stays low - I'd like to set you up with home oxygen at least until you see the lung doctor.

## 2022-02-17 NOTE — Progress Notes (Signed)
Patient ID: Randall Ali., male    DOB: 08-Feb-1941, 81 y.o.   MRN: [id number]  This visit was conducted in person.  BP 118/68   Pulse 75   Temp 98.1 F (36.7 C) (Temporal)   Ht 5\' 8"  (1.727 m)   Wt 207 lb (93.9 kg)   SpO2 91%   BMI 31.47 kg/m    CC: hosp f/u visit  Subjective:   HPI: Randall Ali. is a 81 y.o. male presenting on 02/17/2022 for Hospitalization Follow-up (Admitted on 02/11/22 at [facility], dx altered mental status.  Still has ongoing confusion and feels sleepy all day. Pt accompanied by daughter, Maudie Mercury. )   Recent hospitalization for confusion of sudden onset, 2 d duration.  Hospital records reviewed. Med rec performed.  Respiratory panel negative. Blcx negative x2, UCx grew 70k proteus - treated with rocephin and augmentin PO outpatient course - last dose tomorrow.  UA was clear, he did have electrolyte abnormalities (low Na, K).  Imaging reassuring (CT head, MRI brain w/o).  CXR and CTA chest showed ILD pattern.  EEG in hospital - generalized continuous slowing suggestive of mod diffuse encephalopathy, no seizures.  ?tramadol related - longterm use - but has not recently used.   Home health not set up.  Other follow up appointments scheduled: Dr Melvyn Novas 02/28/2022.   Recent multiple treatments for acute sinusitis - received augmentin x3 this year, doxycycline 10d course 01/31/2022.   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  Since home, confusion returning, somnolence persists - sleeping throughout the day. Daughter concerned he's dehydrated.  One of daughters is staying with him 24/7.  Daughter [name] is helping manage medications.   Notes frontal headache, neck pain as well as chronic dyspnea.  Currently off tramadol.   No fevers/chills, chest pain, dizziness, vision changes, palpitations, abd pain,  diarrhea, dysuria, nausea/vomiting.  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 Interim medical history since our last visit reviewed. Allergies and medications reviewed and updated. Outpatient Medications Prior to Visit  Medication Sig Dispense Refill   acetaminophen (TYLENOL) 325 MG tablet Take 650 mg by mouth as needed for moderate pain or mild pain.     albuterol (VENTOLIN HFA) 108 (90 Base) MCG/ACT inhaler Inhale 2 puffs into the lungs every 6 (six) hours as needed for wheezing or shortness of breath. 18 g 2   amoxicillin-clavulanate (AUGMENTIN) 875-125 MG tablet Take 1 tablet by mouth every 12 (twelve) hours for 4 days. 8 tablet 0   Ascorbic Acid (VITAMIN C) 1000 MG tablet Take 1 tablet (1,000 mg total) by mouth daily.     aspirin 81 MG EC tablet Take 81 mg by mouth daily.  Calcium-Magnesium-Vitamin D (CALCIUM 1200+D3 PO) Take 1 tablet by mouth daily.     Carboxymethylcellul-Glycerin (LUBRICATING EYE DROPS OP) Place 1 drop into both eyes daily as needed (irritation).     Cholecalciferol (VITAMIN D3) 50 MCG (2000 UT) TABS Take 2,000 Units by mouth daily. 30 tablet    Cyanocobalamin (B-12) 5000 MCG CAPS Take 5,000 mcg by mouth daily.     docusate sodium (COLACE) 100 MG capsule Take 200 mg by mouth at  bedtime.     finasteride (PROSCAR) 5 MG tablet Take 1 tablet (5 mg total) by mouth daily. 30 tablet 6   fluticasone (FLONASE) 50 MCG/ACT nasal spray Place 2 sprays into both nostrils daily.     lidocaine-prilocaine (EMLA) cream Apply 30 -45 mins prior to port access. 30 g 3   loratadine (CLARITIN) 10 MG tablet Take 10 mg by mouth daily.     Melatonin 10 MG CAPS Take 10 mg by mouth at bedtime as needed (sleep).     nitroGLYCERIN (NITROSTAT) 0.4 MG SL tablet Place 1 tablet (0.4 mg total) under the tongue every 5 (five) minutes as needed for up to 25 doses for chest pain. 25 tablet 1   olopatadine (PATANOL) 0.1 % ophthalmic solution Place 1 drop into both eyes 2 (two) times daily. 5 mL 12   pantoprazole (PROTONIX) 40 MG tablet TAKE 1 TABLET BY MOUTH DAILY (Patient taking differently: Take 40 mg by mouth daily before breakfast.) 90 tablet 0   predniSONE (DELTASONE) 20 MG tablet Take 1 tablet (20 mg total) by mouth daily with breakfast. Take 3 pills once a day for 1 week; and then 2 pills once a day for 1 week; then 1 pill a day. 60 tablet 0   prochlorperazine (COMPAZINE) 10 MG tablet Take 1 tablet (10 mg total) by mouth every 6 (six) hours as needed for nausea or vomiting. 40 tablet 1   simvastatin (ZOCOR) 40 MG tablet Take 1 tablet (40 mg total) by mouth at bedtime. 90 tablet 3   traMADol (ULTRAM) 50 MG tablet TAKE 1 TABLET BY MOUTH 3 TIMES DAILY AS NEEDED FOR MODERATE PAIN 90 tablet 0   valsartan (DIOVAN) 80 MG tablet One daily (Patient taking differently: Take 80 mg by mouth daily.) 30 tablet 11   Vitamin E 450 MG (1000 UT) CAPS Take 450 Units by mouth daily.     Zinc 50 MG TABS Take 50 mg by mouth daily.     Facility-Administered Medications Prior to Visit  Medication Dose Route Frequency Provider Last Rate Last Admin   heparin lock flush 100 UNIT/ML injection              Per HPI unless specifically indicated in ROS section below Review of Systems  Objective:  BP 118/68   Pulse 75   Temp  98.1 F (36.7 C) (Temporal)   Ht 5\' 8"  (1.727 m)   Wt 207 lb (93.9 kg)   SpO2 91%   BMI 31.47 kg/m   Wt Readings from Last 3 Encounters:  02/17/22 207 lb (93.9 kg)  02/14/22 199 lb 15.3 oz (90.7 kg)  02/06/22 220 lb 6.4 oz (100 kg)      Physical Exam Vitals and nursing note reviewed.  Constitutional:      Appearance: Normal appearance. He is not ill-appearing.     Comments: Ambulates unassisted  HENT:     Mouth/Throat:     Mouth: Mucous membranes are moist.     Pharynx: Oropharynx is clear. No oropharyngeal exudate  or posterior oropharyngeal erythema.  Neck:     Comments: FROM neck without rigidity Cardiovascular:     Rate and Rhythm: Normal rate and regular rhythm.     Pulses: Normal pulses.     Heart sounds: Normal heart sounds.  Pulmonary:     Effort: Pulmonary effort is normal. No respiratory distress.     Breath sounds: Normal breath sounds. No wheezing, rhonchi or rales.  Musculoskeletal:     Cervical back: Normal range of motion and neck supple. No rigidity.     Right lower leg: No edema.     Left lower leg: No edema.  Lymphadenopathy:     Cervical: No cervical adenopathy.  Skin:    General: Skin is warm and dry.     Findings: No rash.  Neurological:     General: No focal deficit present.     Mental Status: He is alert.     Cranial Nerves: Cranial nerves 2-12 are intact.     Sensory: Sensation is intact.     Motor: Motor function is intact.     Coordination: Coordination is intact. Romberg sign negative. Rapid alternating movements normal.     Gait: Gait is intact.     Comments:  Oriented x1 (person) Is able to identify daughter in room and myself.  CN 2-12 intact FTN intact, EOMI without pain   Psychiatric:        Mood and Affect: Mood normal.        Behavior: Behavior normal.       Results for orders placed or performed in visit on 02/17/22  Comprehensive metabolic panel  Result Value Ref Range   Sodium 134 (L) 135 - 145 mEq/L   Potassium 4.3  3.5 - 5.1 mEq/L   Chloride 100 96 - 112 mEq/L   CO2 26 19 - 32 mEq/L   Glucose, Bld 113 (H) 70 - 99 mg/dL   BUN 25 (H) 6 - 23 mg/dL   Creatinine, Ser 1.41 0.40 - 1.50 mg/dL   Total Bilirubin 1.2 0.2 - 1.2 mg/dL   Alkaline Phosphatase 46 39 - 117 U/L   AST 14 0 - 37 U/L   ALT 16 0 - 53 U/L   Total Protein 6.7 6.0 - 8.3 g/dL   Albumin 3.9 3.5 - 5.2 g/dL   GFR 47.25 (L) >60.00 mL/min   Calcium 9.9 8.4 - 10.5 mg/dL  CBC with Differential/Platelet  Result Value Ref Range   WBC 15.5 (H) 4.0 - 10.5 K/uL   RBC 4.98 4.22 - 5.81 Mil/uL   Hemoglobin 15.1 13.0 - 17.0 g/dL   HCT 45.3 39.0 - 52.0 %   MCV 90.9 78.0 - 100.0 fl   MCHC 33.4 30.0 - 36.0 g/dL   RDW 13.3 11.5 - 15.5 %   Platelets 205.0 150.0 - 400.0 K/uL   Neutrophils Relative % 86.7 Repeated and verified X2. (H) 43.0 - 77.0 %   Lymphocytes Relative 6.3 (L) 12.0 - 46.0 %   Monocytes Relative 6.7 3.0 - 12.0 %   Eosinophils Relative 0.1 0.0 - 5.0 %   Basophils Relative 0.2 0.0 - 3.0 %   Neutro Abs 13.4 (H) 1.4 - 7.7 K/uL   Lymphs Abs 1.0 0.7 - 4.0 K/uL   Monocytes Absolute 1.0 0.1 - 1.0 K/uL   Eosinophils Absolute 0.0 0.0 - 0.7 K/uL   Basophils Absolute 0.0 0.0 - 0.1 K/uL  Sedimentation rate  Result Value Ref Range   Sed Rate 12 0 - 20 mm/hr  POCT Urinalysis Dipstick (Automated)  Result Value Ref Range   Color, UA dark yellow    Clarity, UA clear    Glucose, UA Negative Negative   Bilirubin, UA negative    Ketones, UA +/-    Spec Grav, UA >=1.030 (A) 1.010 - 1.025   Blood, UA negative    pH, UA 5.5 5.0 - 8.0   Protein, UA Positive (A) Negative   Urobilinogen, UA 0.2 0.2 or 1.0 E.U./dL   Nitrite, UA negative    Leukocytes, UA Negative Negative   *Note: Due to a large number of results and/or encounters for the requested time period, some results have not been displayed. A complete set of results can be found in Results Review.    Assessment & Plan:   Problem List Items Addressed This Visit     OSA (obstructive  sleep apnea)    Continue home CPAP.      Cancer of lower lobe of left lung ([place])    Appreciate onc care, Keytruda currently on hold.       Relevant Orders   For home use only DME oxygen   Ambulatory referral to [place] (dyspnea on exertion)    Pending pulm appt for further eval of ILD recently noted on imaging studies.       Relevant Orders   For home use only DME oxygen   Ambulatory referral to Home Health   Recurrent sinusitis    Has received multiple antibiotic treatments for sinusitis this year - augmentin x3, doxycycline late last month and zpack this month. Discussed ENT eval, will await resolution of AMS.       Confusion and disorientation   Relevant Orders   Ambulatory referral to [place] (altered mental status) - Primary    Initially thought related to UTI however appropriate treatment has not lifted AMS - family notes again worsening confusion over the past 24 hours.  EEG in hospital consistent with diffuse encephalopathy.  ?hypoxia related altered mentation as O2 sats do drop to 80s with ambulation - I will order home oxygen to use while he sees pulm in 2 weeks.  Will update labs including rpt UA.  Will ask [facility] skilled nursing to evaluate patient at home as well.  Advised minimize tramadol use at this time. Start with tylenol for pain.  Family supportive, he will have 24 hour supervision over next several days. Advised family member take over medication management.  Consider contrasted MR brain for further evaluation, r/o infection or cancer related changes.  He is currently on prolonged prednisone taper.       Relevant Orders   POCT Urinalysis Dipstick (Automated) (Completed)   Comprehensive metabolic panel (Completed)   CBC with Differential/Platelet (Completed)   Sedimentation rate (Completed)   Vitamin B1   For home use only DME oxygen   Ambulatory referral to [place]   MR Brain W Wo Contrast   UTI (urinary  tract infection)    UCx grew proteus, s/p treatment with Rocephin in hospital and now completing augmentin course. Update UA - clear. AMS not resolving.      ILD (interstitial lung disease) ([place])    Noted on recent imaging. Pending pulm eval. I will start him on home oxygen.  Ambulatory Pulse Ox on RA SpO2 at start:  87% SpO2 while ambulating: 85% SpO2 at recovery: 88%      Relevant Orders   For home  use only DME oxygen   Ambulatory referral to Home Health   Other Visit Diagnoses     Hypoxia       Relevant Orders   For home use only DME oxygen   Ambulatory referral to [place]   Acute nonintractable headache, unspecified headache type       Relevant Orders   MR Brain W Wo Contrast        No orders of the defined types were placed in this encounter.  Orders Placed This Encounter  Procedures   For home use only DME oxygen    Order Specific Question:   Length of Need    Answer:   6 Months    Order Specific Question:   Mode or (Route)    Answer:   Nasal cannula    Order Specific Question:   Liters per Minute    Answer:   2    Order Specific Question:   Frequency    Answer:   Continuous (stationary and portable oxygen unit needed)    Order Specific Question:   Oxygen conserving device    Answer:   Yes    Order Specific Question:   Oxygen delivery system    Answer:   Gas   MR Brain W Wo Contrast    Standing Status:   Future    Standing Expiration Date:   02/19/2023    Order Specific Question:   If indicated for the ordered procedure, I authorize the administration of contrast media per Radiology protocol    Answer:   Yes    Order Specific Question:   What is the patient's sedation requirement?    Answer:   No Sedation    Order Specific Question:   Does the patient have a pacemaker or implanted devices?    Answer:   No    Order Specific Question:   Preferred imaging location?    Answer:   [facility] (table limit-350lbs)   Comprehensive metabolic panel   CBC  with Differential/Platelet   Sedimentation rate   Vitamin B1   Ambulatory referral to Home Health    Referral Priority:   Routine    Referral Type:   Home Health Care    Referral Reason:   Specialty Services Required    Requested Specialty:   [place]    Number of Visits Requested:   1   POCT Urinalysis Dipstick (Automated)     Patient Instructions  Labs today - we will be in touch with lab results.  Urinalysis today.  Walking oxygen test today - your oxygen level stays low - I'd like to set you up with home oxygen at least until you see the lung doctor.    Follow up plan: Return if symptoms worsen or fail to improve.  Ria Bush, MD

## 2022-02-18 DIAGNOSIS — G4733 Obstructive sleep apnea (adult) (pediatric): Secondary | ICD-10-CM | POA: Diagnosis not present

## 2022-02-18 DIAGNOSIS — I700 Atherosclerosis of aorta: Secondary | ICD-10-CM | POA: Diagnosis not present

## 2022-02-18 DIAGNOSIS — J019 Acute sinusitis, unspecified: Secondary | ICD-10-CM | POA: Diagnosis not present

## 2022-02-18 DIAGNOSIS — R0902 Hypoxemia: Secondary | ICD-10-CM | POA: Diagnosis not present

## 2022-02-18 DIAGNOSIS — Z9989 Dependence on other enabling machines and devices: Secondary | ICD-10-CM | POA: Diagnosis not present

## 2022-02-18 DIAGNOSIS — J849 Interstitial pulmonary disease, unspecified: Secondary | ICD-10-CM | POA: Insufficient documentation

## 2022-02-18 DIAGNOSIS — I2510 Atherosclerotic heart disease of native coronary artery without angina pectoris: Secondary | ICD-10-CM | POA: Diagnosis not present

## 2022-02-18 DIAGNOSIS — I110 Hypertensive heart disease with heart failure: Secondary | ICD-10-CM | POA: Diagnosis not present

## 2022-02-18 DIAGNOSIS — C3432 Malignant neoplasm of lower lobe, left bronchus or lung: Secondary | ICD-10-CM | POA: Diagnosis not present

## 2022-02-18 DIAGNOSIS — I5030 Unspecified diastolic (congestive) heart failure: Secondary | ICD-10-CM | POA: Diagnosis not present

## 2022-02-18 NOTE — Assessment & Plan Note (Signed)
Continue home CPAP ?

## 2022-02-18 NOTE — Assessment & Plan Note (Addendum)
Has received multiple antibiotic treatments for sinusitis this year - augmentin x3, doxycycline late last month and zpack this month. Discussed ENT eval, will await resolution of AMS.

## 2022-02-18 NOTE — Assessment & Plan Note (Signed)
Appreciate onc care, Keytruda currently on hold.

## 2022-02-18 NOTE — Assessment & Plan Note (Signed)
UCx grew proteus, s/p treatment with Rocephin in hospital and now completing augmentin course. Update UA - clear. AMS not resolving.

## 2022-02-18 NOTE — Assessment & Plan Note (Addendum)
Pending pulm appt for further eval of ILD recently noted on imaging studies.

## 2022-02-18 NOTE — Assessment & Plan Note (Addendum)
Initially thought related to UTI however appropriate treatment has not lifted AMS - family notes again worsening confusion over the past 24 hours.  EEG in hospital consistent with diffuse encephalopathy.  ?hypoxia related altered mentation as O2 sats do drop to 80s with ambulation - I will order home oxygen to use while he sees pulm in 2 weeks.  Will update labs including rpt UA.  Will ask [facility] skilled nursing to evaluate patient at home as well.  Advised minimize tramadol use at this time. Start with tylenol for pain.  Family supportive, he will have 24 hour supervision over next several days. Advised family member take over medication management.  Consider contrasted MR brain for further evaluation, r/o infection or cancer related changes.  He is currently on prolonged prednisone taper.

## 2022-02-18 NOTE — Assessment & Plan Note (Addendum)
Noted on recent imaging. Pending pulm eval. I will start him on home oxygen.  Ambulatory Pulse Ox on RA SpO2 at start:  87% SpO2 while ambulating: 85% SpO2 at recovery: 88%

## 2022-02-19 ENCOUNTER — Telehealth: Payer: Self-pay | Admitting: Family Medicine

## 2022-02-19 DIAGNOSIS — R0683 Snoring: Secondary | ICD-10-CM | POA: Diagnosis not present

## 2022-02-19 DIAGNOSIS — I10 Essential (primary) hypertension: Secondary | ICD-10-CM | POA: Diagnosis not present

## 2022-02-19 DIAGNOSIS — G4733 Obstructive sleep apnea (adult) (pediatric): Secondary | ICD-10-CM | POA: Diagnosis not present

## 2022-02-19 NOTE — Telephone Encounter (Signed)
Agree with this. Thanks.  

## 2022-02-19 NOTE — Telephone Encounter (Signed)
Left a message on voicemail for Raquel Sarna to call the office to call the office back.

## 2022-02-19 NOTE — Telephone Encounter (Signed)
Home Health verbal orders Caller Name: [facility] Name: Doristine Johns number: [phone] (can leave a voicemail)  Requesting PT  Frequency: twice a week for 2 weeks, once a week for 2 weeks  Please forward to [facility] pool or providers CMA

## 2022-02-19 NOTE — Telephone Encounter (Signed)
Raquel Sarna from [facility] called in stating that [facility] oxygen hasn't came in yet and his oxygen level went down. She stated it went down to 84 and finally went back up to 95 with sitting down and breathing. She also wanted to follow up with his lab results. Thank you!

## 2022-02-19 NOTE — Telephone Encounter (Signed)
Patient's daughter [name] called stating that they never got his lab results. Advised [name] that the results were sent on mychart. [name] stated that she has not looked for them on mychart. Results were given to [facility] while on the phone and she verbalized undertstanding. [name] stated that [facility] was suppose to  deliver the oxygen yesterday but there was a delay. [name] stated that they hope to have the oxygen sometime today. [name] was advised that she will get a call from one of the referral coordinators to get the MRI set up. [name] was advised to limit her dad's activities until he gets his oxygen.

## 2022-02-19 NOTE — Telephone Encounter (Addendum)
Randall Ali was given orders by telephone and she verbalized understanding.Randall Ali also wanted his urine results which was given to her.

## 2022-02-20 ENCOUNTER — Telehealth: Payer: Self-pay | Admitting: Family Medicine

## 2022-02-20 DIAGNOSIS — I10 Essential (primary) hypertension: Secondary | ICD-10-CM | POA: Diagnosis not present

## 2022-02-20 DIAGNOSIS — J8417 Interstitial lung disease with progressive fibrotic phenotype in diseases classified elsewhere: Secondary | ICD-10-CM | POA: Diagnosis not present

## 2022-02-20 DIAGNOSIS — B004 Herpesviral encephalitis: Secondary | ICD-10-CM | POA: Diagnosis not present

## 2022-02-20 DIAGNOSIS — Z951 Presence of aortocoronary bypass graft: Secondary | ICD-10-CM | POA: Diagnosis not present

## 2022-02-20 DIAGNOSIS — R54 Age-related physical debility: Secondary | ICD-10-CM | POA: Diagnosis not present

## 2022-02-20 DIAGNOSIS — K219 Gastro-esophageal reflux disease without esophagitis: Secondary | ICD-10-CM | POA: Diagnosis not present

## 2022-02-20 DIAGNOSIS — J849 Interstitial pulmonary disease, unspecified: Secondary | ICD-10-CM | POA: Diagnosis not present

## 2022-02-20 DIAGNOSIS — G929 Unspecified toxic encephalopathy: Secondary | ICD-10-CM | POA: Diagnosis not present

## 2022-02-20 DIAGNOSIS — Z87891 Personal history of nicotine dependence: Secondary | ICD-10-CM | POA: Diagnosis not present

## 2022-02-20 DIAGNOSIS — I5030 Unspecified diastolic (congestive) heart failure: Secondary | ICD-10-CM | POA: Diagnosis not present

## 2022-02-20 DIAGNOSIS — C3432 Malignant neoplasm of lower lobe, left bronchus or lung: Secondary | ICD-10-CM | POA: Diagnosis not present

## 2022-02-20 DIAGNOSIS — C3492 Malignant neoplasm of unspecified part of left bronchus or lung: Secondary | ICD-10-CM | POA: Diagnosis not present

## 2022-02-20 DIAGNOSIS — R5381 Other malaise: Secondary | ICD-10-CM | POA: Diagnosis not present

## 2022-02-20 DIAGNOSIS — Z20822 Contact with and (suspected) exposure to covid-19: Secondary | ICD-10-CM | POA: Diagnosis not present

## 2022-02-20 DIAGNOSIS — R5383 Other fatigue: Secondary | ICD-10-CM | POA: Diagnosis not present

## 2022-02-20 DIAGNOSIS — D849 Immunodeficiency, unspecified: Secondary | ICD-10-CM | POA: Diagnosis not present

## 2022-02-20 DIAGNOSIS — I110 Hypertensive heart disease with heart failure: Secondary | ICD-10-CM | POA: Diagnosis not present

## 2022-02-20 DIAGNOSIS — J988 Other specified respiratory disorders: Secondary | ICD-10-CM | POA: Diagnosis not present

## 2022-02-20 DIAGNOSIS — Z66 Do not resuscitate: Secondary | ICD-10-CM | POA: Diagnosis not present

## 2022-02-20 DIAGNOSIS — J329 Chronic sinusitis, unspecified: Secondary | ICD-10-CM | POA: Diagnosis not present

## 2022-02-20 DIAGNOSIS — G8929 Other chronic pain: Secondary | ICD-10-CM | POA: Diagnosis not present

## 2022-02-20 DIAGNOSIS — I2510 Atherosclerotic heart disease of native coronary artery without angina pectoris: Secondary | ICD-10-CM | POA: Diagnosis not present

## 2022-02-20 DIAGNOSIS — C3490 Malignant neoplasm of unspecified part of unspecified bronchus or lung: Secondary | ICD-10-CM | POA: Diagnosis not present

## 2022-02-20 DIAGNOSIS — G4733 Obstructive sleep apnea (adult) (pediatric): Secondary | ICD-10-CM | POA: Diagnosis not present

## 2022-02-20 DIAGNOSIS — R911 Solitary pulmonary nodule: Secondary | ICD-10-CM | POA: Diagnosis not present

## 2022-02-20 DIAGNOSIS — I700 Atherosclerosis of aorta: Secondary | ICD-10-CM | POA: Diagnosis not present

## 2022-02-20 DIAGNOSIS — R930 Abnormal findings on diagnostic imaging of skull and head, not elsewhere classified: Secondary | ICD-10-CM | POA: Diagnosis not present

## 2022-02-20 DIAGNOSIS — R5382 Chronic fatigue, unspecified: Secondary | ICD-10-CM | POA: Diagnosis not present

## 2022-02-20 DIAGNOSIS — I70229 Atherosclerosis of native arteries of extremities with rest pain, unspecified extremity: Secondary | ICD-10-CM | POA: Diagnosis not present

## 2022-02-20 DIAGNOSIS — T451X5A Adverse effect of antineoplastic and immunosuppressive drugs, initial encounter: Secondary | ICD-10-CM | POA: Diagnosis not present

## 2022-02-20 DIAGNOSIS — I6523 Occlusion and stenosis of bilateral carotid arteries: Secondary | ICD-10-CM | POA: Diagnosis not present

## 2022-02-20 DIAGNOSIS — R531 Weakness: Secondary | ICD-10-CM | POA: Diagnosis not present

## 2022-02-20 DIAGNOSIS — Z9989 Dependence on other enabling machines and devices: Secondary | ICD-10-CM | POA: Diagnosis not present

## 2022-02-20 DIAGNOSIS — N400 Enlarged prostate without lower urinary tract symptoms: Secondary | ICD-10-CM | POA: Diagnosis not present

## 2022-02-20 DIAGNOSIS — J449 Chronic obstructive pulmonary disease, unspecified: Secondary | ICD-10-CM | POA: Diagnosis not present

## 2022-02-20 DIAGNOSIS — B1009 Other human herpesvirus encephalitis: Secondary | ICD-10-CM | POA: Diagnosis not present

## 2022-02-20 DIAGNOSIS — Z7951 Long term (current) use of inhaled steroids: Secondary | ICD-10-CM | POA: Diagnosis not present

## 2022-02-20 DIAGNOSIS — R0902 Hypoxemia: Secondary | ICD-10-CM | POA: Diagnosis not present

## 2022-02-20 DIAGNOSIS — I252 Old myocardial infarction: Secondary | ICD-10-CM | POA: Diagnosis not present

## 2022-02-20 DIAGNOSIS — J704 Drug-induced interstitial lung disorders, unspecified: Secondary | ICD-10-CM | POA: Diagnosis not present

## 2022-02-20 DIAGNOSIS — M1389 Other specified arthritis, multiple sites: Secondary | ICD-10-CM | POA: Diagnosis not present

## 2022-02-20 DIAGNOSIS — R4182 Altered mental status, unspecified: Secondary | ICD-10-CM | POA: Diagnosis not present

## 2022-02-20 DIAGNOSIS — Z6831 Body mass index (BMI) 31.0-31.9, adult: Secondary | ICD-10-CM | POA: Diagnosis not present

## 2022-02-20 DIAGNOSIS — D696 Thrombocytopenia, unspecified: Secondary | ICD-10-CM | POA: Diagnosis not present

## 2022-02-20 DIAGNOSIS — R0609 Other forms of dyspnea: Secondary | ICD-10-CM | POA: Diagnosis not present

## 2022-02-20 DIAGNOSIS — H9190 Unspecified hearing loss, unspecified ear: Secondary | ICD-10-CM | POA: Diagnosis not present

## 2022-02-20 DIAGNOSIS — J019 Acute sinusitis, unspecified: Secondary | ICD-10-CM | POA: Diagnosis not present

## 2022-02-20 DIAGNOSIS — E785 Hyperlipidemia, unspecified: Secondary | ICD-10-CM | POA: Diagnosis not present

## 2022-02-20 DIAGNOSIS — E46 Unspecified protein-calorie malnutrition: Secondary | ICD-10-CM | POA: Diagnosis not present

## 2022-02-20 NOTE — Telephone Encounter (Signed)
I spoke with Randall Ali (DPR signed) pt was seen 02/17/22 for HFU; Randall Ali said pt is not drinking much at all; Randall Ali is not sure how much voiding each time he urinates but pt was not going to bathroom last several days except once every few hours. Now pt is drinking less and urinating about 4 times every hour today. Pt still sleeping most of day and the disorientation and confusion has worsened; pt went to kitchen and urinated in floor instead of going to bathroom. Pt can't seem to get his words out. Concerned could be dehydrated also. Randall Ali is going to take pt to an ED; not sure which one she is going to whether [facility] affiliated or [facility]. Sending note to [name] who is out of office and Dr Damita Dunnings who is in office and Lattie Haw CMA.

## 2022-02-20 NOTE — Telephone Encounter (Signed)
Noted. Agree with ER eval.  Routed to PCP as FYI.

## 2022-02-20 NOTE — Telephone Encounter (Signed)
Noted. Plz call tomorrow for update on ER visit.

## 2022-02-20 NOTE — Telephone Encounter (Signed)
Patient daughter Maudie Mercury called in stating that Normans symptoms haven't eased up at all. She stated he is sleeping 85% of the day. She also stated that she thinks something is going on with his kidneys because he went several days without using the bathroom and today he has been going 4 times an hour and he has only had about 20 oz of drink today. She also wanted to follow up on the referral and what the steps might be. Please advise. Thank you

## 2022-02-21 DIAGNOSIS — C3492 Malignant neoplasm of unspecified part of left bronchus or lung: Secondary | ICD-10-CM | POA: Insufficient documentation

## 2022-02-21 NOTE — Telephone Encounter (Signed)
Noted! Thank you

## 2022-02-21 NOTE — Telephone Encounter (Signed)
Spoke to patient's daughter Maudie Mercury and was advised that he was admitted to the hospital in [place]. Maudie Mercury stated that her dad was dehydrated and they started him on IV fluids. Maudie Mercury stated that they did some test on her dad yesterday that were inconclusive. Maudie Mercury stated that they are bringing in an oncologist to review his cancer and treatments that he has had. Maudie Mercury stated that she is getting ready to go to the hospital now to check on her dad. Advised Kim to call the office back if she needs anything from Dr. Danise Mina.

## 2022-02-24 LAB — VITAMIN B1: Vitamin B1 (Thiamine): 13 nmol/L (ref 8–30)

## 2022-02-25 DIAGNOSIS — R0902 Hypoxemia: Secondary | ICD-10-CM | POA: Diagnosis not present

## 2022-02-25 DIAGNOSIS — Z7982 Long term (current) use of aspirin: Secondary | ICD-10-CM

## 2022-02-25 DIAGNOSIS — G4733 Obstructive sleep apnea (adult) (pediatric): Secondary | ICD-10-CM

## 2022-02-25 DIAGNOSIS — Z9989 Dependence on other enabling machines and devices: Secondary | ICD-10-CM

## 2022-02-25 DIAGNOSIS — N401 Enlarged prostate with lower urinary tract symptoms: Secondary | ICD-10-CM

## 2022-02-25 DIAGNOSIS — I700 Atherosclerosis of aorta: Secondary | ICD-10-CM

## 2022-02-25 DIAGNOSIS — M1389 Other specified arthritis, multiple sites: Secondary | ICD-10-CM

## 2022-02-25 DIAGNOSIS — J302 Other seasonal allergic rhinitis: Secondary | ICD-10-CM

## 2022-02-25 DIAGNOSIS — I252 Old myocardial infarction: Secondary | ICD-10-CM

## 2022-02-25 DIAGNOSIS — E785 Hyperlipidemia, unspecified: Secondary | ICD-10-CM

## 2022-02-25 DIAGNOSIS — C3432 Malignant neoplasm of lower lobe, left bronchus or lung: Secondary | ICD-10-CM | POA: Diagnosis not present

## 2022-02-25 DIAGNOSIS — N529 Male erectile dysfunction, unspecified: Secondary | ICD-10-CM

## 2022-02-25 DIAGNOSIS — I2510 Atherosclerotic heart disease of native coronary artery without angina pectoris: Secondary | ICD-10-CM

## 2022-02-25 DIAGNOSIS — Z959 Presence of cardiac and vascular implant and graft, unspecified: Secondary | ICD-10-CM

## 2022-02-25 DIAGNOSIS — I110 Hypertensive heart disease with heart failure: Secondary | ICD-10-CM | POA: Diagnosis not present

## 2022-02-25 DIAGNOSIS — J849 Interstitial pulmonary disease, unspecified: Secondary | ICD-10-CM

## 2022-02-25 DIAGNOSIS — Z951 Presence of aortocoronary bypass graft: Secondary | ICD-10-CM

## 2022-02-25 DIAGNOSIS — J019 Acute sinusitis, unspecified: Secondary | ICD-10-CM

## 2022-02-25 DIAGNOSIS — K219 Gastro-esophageal reflux disease without esophagitis: Secondary | ICD-10-CM

## 2022-02-25 DIAGNOSIS — I5030 Unspecified diastolic (congestive) heart failure: Secondary | ICD-10-CM | POA: Diagnosis not present

## 2022-02-25 DIAGNOSIS — K449 Diaphragmatic hernia without obstruction or gangrene: Secondary | ICD-10-CM

## 2022-02-28 ENCOUNTER — Inpatient Hospital Stay: Payer: PPO | Admitting: Internal Medicine

## 2022-03-03 DIAGNOSIS — Z9189 Other specified personal risk factors, not elsewhere classified: Secondary | ICD-10-CM | POA: Insufficient documentation

## 2022-03-13 ENCOUNTER — Telehealth: Payer: Self-pay | Admitting: *Deleted

## 2022-03-13 NOTE — Patient Outreach (Signed)
  Care Coordination [facility] Note Transition Care Management Follow-up Telephone Call Date of discharge and from where: [facility] [phone] How have you been since you were released from the hospital? He is doing better. Just finished eating his breakfast and is resting Any questions or concerns? Yes Per daughter the patient was given atbx for 2 days and the Dr had said 21 days. She has put a call into the Dr office and is waiting on a call back  Items Reviewed: Did the pt receive and understand the discharge instructions provided? Yes  Medications obtained and verified? Yes  Other? Yes  Per patient was told patient would stay on Atbx  longer prescription Any new allergies since your discharge? No  Dietary orders reviewed? No Do you have support at home? Yes   Home Care and Equipment/Supplies: Were home health services ordered? yes If so, what is the name of the agency? [facility]  for PT/OT Has the agency set up a time to come to the patient's home? No Daughter [name] understands to follow up outreach to agency if no call within 48 hrs,  Were any new equipment or medical supplies ordered?  No What is the name of the medical supply agency? N/a Were you able to get the supplies/equipment? not applicable Do you have any questions related to the use of the equipment or supplies? Yes:    Functional Questionnaire: (I = Independent and D = Dependent) ADLs: D  Bathing/Dressing- D  Meal Prep- D  Eating- I  Maintaining continence- I  Transferring/Ambulation- D  Managing Meds- D  Follow up appointments reviewed:  PCP Hospital f/u appt confirmed? Yes  Scheduled to see Dr Danise Mina [phone] 8:30 and Labs on [id number] 7:30 on  [facility] f/u appt confirmed? No   Are transportation arrangements needed? No  If their condition worsens, is the pt aware to call PCP or go to the Emergency Dept.? Yes Was the patient provided with contact information for the PCP's office or ED? Yes Was  to pt encouraged to call back with questions or concerns? Yes  SDOH assessments and interventions completed:   Yes  Care Coordination Interventions Activated:  Yes   Care Coordination Interventions:  No Care Coordination interventions needed at this time.   Encounter Outcome:  Pt. Visit Completed    [place] [facility] [phone]

## 2022-03-14 ENCOUNTER — Telehealth: Payer: Self-pay | Admitting: Family Medicine

## 2022-03-14 DIAGNOSIS — C3432 Malignant neoplasm of lower lobe, left bronchus or lung: Secondary | ICD-10-CM | POA: Diagnosis not present

## 2022-03-14 DIAGNOSIS — J019 Acute sinusitis, unspecified: Secondary | ICD-10-CM | POA: Diagnosis not present

## 2022-03-14 DIAGNOSIS — I5030 Unspecified diastolic (congestive) heart failure: Secondary | ICD-10-CM | POA: Diagnosis not present

## 2022-03-14 DIAGNOSIS — Z9989 Dependence on other enabling machines and devices: Secondary | ICD-10-CM | POA: Diagnosis not present

## 2022-03-14 DIAGNOSIS — I2510 Atherosclerotic heart disease of native coronary artery without angina pectoris: Secondary | ICD-10-CM | POA: Diagnosis not present

## 2022-03-14 DIAGNOSIS — I700 Atherosclerosis of aorta: Secondary | ICD-10-CM | POA: Diagnosis not present

## 2022-03-14 DIAGNOSIS — I110 Hypertensive heart disease with heart failure: Secondary | ICD-10-CM | POA: Diagnosis not present

## 2022-03-14 DIAGNOSIS — G4733 Obstructive sleep apnea (adult) (pediatric): Secondary | ICD-10-CM | POA: Diagnosis not present

## 2022-03-14 DIAGNOSIS — J849 Interstitial pulmonary disease, unspecified: Secondary | ICD-10-CM | POA: Diagnosis not present

## 2022-03-14 DIAGNOSIS — R0902 Hypoxemia: Secondary | ICD-10-CM | POA: Diagnosis not present

## 2022-03-14 NOTE — Telephone Encounter (Signed)
Relayed PT orders to [facility] and info about Rx Toprol, per Dr. Danise Mina.

## 2022-03-14 NOTE — Telephone Encounter (Signed)
Agree with these verbal orders.  I'm glad he's home and doing better.   He was on toprol XL for years but this was discontinued last year ~04/2021 - if BP stable off med, ok to hold it.

## 2022-03-14 NOTE — Telephone Encounter (Signed)
Home Health verbal orders Caller Name: emily  Agency Name: [facility]   Callback number: [phone], secured, no ext  Requesting OT/PT/Skilled nursing/Social Work/Speech: physcial therapy   Reason:  Frequency: two week two, one week two  Please forward to [facility] pool or providers CMA   QUESTION : Raquel Sarna stated from the pt's discharge papers from the hospital, it had Toprol XL on his med list & the pt has never taken that medication. Raquel Sarna asked was that a mistake or something?

## 2022-03-15 DIAGNOSIS — B004 Herpesviral encephalitis: Secondary | ICD-10-CM | POA: Diagnosis not present

## 2022-03-19 ENCOUNTER — Other Ambulatory Visit: Payer: Self-pay | Admitting: *Deleted

## 2022-03-19 ENCOUNTER — Encounter: Payer: Self-pay | Admitting: Cardiovascular Disease

## 2022-03-19 MED ORDER — NITROGLYCERIN 0.4 MG SL SUBL: 0.4000 mg | SUBLINGUAL_TABLET | SUBLINGUAL | 3 refills | Status: AC | PRN

## 2022-03-21 ENCOUNTER — Telehealth: Payer: Self-pay | Admitting: Family Medicine

## 2022-03-21 NOTE — Telephone Encounter (Signed)
Noted  

## 2022-03-21 NOTE — Telephone Encounter (Signed)
Charri from [place] called in stating that the patient respiratory rate was higher than normal at 28 and he was having SOB.  She stated she wasn't sure if it was related to his diagnosis or not, but his daughter stated that it was normal.

## 2022-03-25 ENCOUNTER — Telehealth: Payer: Self-pay | Admitting: Family Medicine

## 2022-03-25 NOTE — Telephone Encounter (Signed)
Noted.  Has appt with me next week.

## 2022-03-25 NOTE — Telephone Encounter (Signed)
[name] from [place] called and stated patient fell 03/22/2022 when he was trying to go to the bathroom, scraped right hand and bruised on right elbow. Call back number [phone].

## 2022-03-27 ENCOUNTER — Telehealth: Payer: Self-pay | Admitting: Family Medicine

## 2022-03-27 NOTE — Telephone Encounter (Signed)
Agree with this. Thanks.  

## 2022-03-27 NOTE — Telephone Encounter (Signed)
Lvm for Gina (on confidential vm per message), of [facility], informing her Dr. Darnell Level is giving verbal orders for services requested for pt.

## 2022-03-27 NOTE — Telephone Encounter (Signed)
Home Health verbal orders [place] Name: [facility]  Callback number:      Move speech therapy evaluation to week of 10/22 per patient request due to multiple appointments  Reason:  Frequency:  Please forward to [facility] pool or providers CMA

## 2022-03-30 ENCOUNTER — Other Ambulatory Visit: Payer: Self-pay | Admitting: Family Medicine

## 2022-03-30 DIAGNOSIS — E782 Mixed hyperlipidemia: Secondary | ICD-10-CM

## 2022-03-30 DIAGNOSIS — N179 Acute kidney failure, unspecified: Secondary | ICD-10-CM

## 2022-03-30 DIAGNOSIS — B1009 Other human herpesvirus encephalitis: Secondary | ICD-10-CM

## 2022-03-31 ENCOUNTER — Ambulatory Visit (INDEPENDENT_AMBULATORY_CARE_PROVIDER_SITE_OTHER): Payer: PPO | Admitting: Family Medicine

## 2022-03-31 ENCOUNTER — Encounter: Payer: Self-pay | Admitting: Family Medicine

## 2022-03-31 ENCOUNTER — Telehealth: Payer: Self-pay | Admitting: Family Medicine

## 2022-03-31 VITALS — BP 100/60 | HR 92 | Temp 97.3°F | Ht 68.0 in | Wt 211.5 lb

## 2022-03-31 DIAGNOSIS — I10 Essential (primary) hypertension: Secondary | ICD-10-CM

## 2022-03-31 DIAGNOSIS — R0981 Nasal congestion: Secondary | ICD-10-CM

## 2022-03-31 DIAGNOSIS — B1009 Other human herpesvirus encephalitis: Secondary | ICD-10-CM

## 2022-03-31 DIAGNOSIS — R410 Disorientation, unspecified: Secondary | ICD-10-CM | POA: Diagnosis not present

## 2022-03-31 DIAGNOSIS — C3432 Malignant neoplasm of lower lobe, left bronchus or lung: Secondary | ICD-10-CM | POA: Diagnosis not present

## 2022-03-31 DIAGNOSIS — Z789 Other specified health status: Secondary | ICD-10-CM

## 2022-03-31 DIAGNOSIS — J849 Interstitial pulmonary disease, unspecified: Secondary | ICD-10-CM

## 2022-03-31 NOTE — Patient Instructions (Addendum)
Blood pressures are running a bit too low - drop valsartan dose to 1/2 tablet daily (40mg  daily).  Try different antihistamine brand instead of claritin. Try flonase. May take guaifenesin (plain mucinex) with large glass of water to help mobilize mucous out. I don't see obvious infection at this time.  My office will reach out to home health about skilled nursing assessment.  I'd like to refer you to neurology in [place] as well for ongoing memory difficulty after encephalitis.  Follow up with Dr Aletha Halim office for appointment.  Keep next week's appointment, come in fasting.  Cancel lab visit for tomorrow.

## 2022-03-31 NOTE — Telephone Encounter (Signed)
Home Health verbal orders [place] Name: [name]  Callback number: [phone]  [id number] OT/PT/Skilled nursing/Social Work/Speech:  Reason:Speech  Frequency:1X W 3  Please forward to [facility] pool or providers CMA

## 2022-03-31 NOTE — Telephone Encounter (Signed)
Agree with these verbal orders.  However I also want patient to get skilled nursing and nurse aide. I will be placing new HH order for these. Can [facility] add these on? Thanks.

## 2022-03-31 NOTE — Progress Notes (Signed)
Patient ID: Randall Ali., male    DOB: May 14, 1941, 81 y.o.   MRN: [id number]  This visit was conducted in person.  BP 100/60   Pulse 92   Temp (!) 97.3 F (36.3 C) (Temporal)   Ht 5\' 8"  (1.727 m)   Wt 211 lb 8 oz (95.9 kg)   SpO2 90%   BMI 32.16 kg/m   BP Readings from Last 3 Encounters:  03/31/22 100/60  02/17/22 118/68  02/14/22 113/60    CC: hospital f/u visit  Subjective:   HPI: Randall Ali. is a 81 y.o. male presenting on 03/31/2022 for Hospitalization Follow-up   Here with daughter Maudie Mercury.  Planning to get walker.   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 Also treated with vimpat. Antiviral transitioned to oral valacyclovir 1000mg  TID for total 14 day therapy. Today is last day of Vimpat as well as prednisone taper.   MRI brain with/without contrasted showed abnormal enhancement tracking along left collateral culcus with surrounding parenchymal edema involving L medial temporal lobe and adjacent occipital lobe.   NSCLC stage 4 along with ILD - progressive dyspnea in setting of ILD due to pembrolizumab , was on prednisone taper but given respiratory status improving, prednisone taper started 03/04/2022, today is last day of prednisone 5mg .   Hospital records reviewed. Med rec performed.   Came straight home from the hospital 03/12/2022. Struggling at home, daughters have been staying with him for 24 hour assistance/supervision. Despite appropriate treatment with antiviral, daughter notes no significant improvement in cognition - ongoing disorientation/confusion daily. They question unmasking of dementia or other neurocognitive process going on.   Home health set up with [facility] PT, OT, ST. No SN or nurse aide available.  Other follow up appointments scheduled: none   Activities of Daily Living:     Bathing- partially  dependent    Dressing- independent in dressing but needs assistance in selecting clothes    Eating- independent    Toileting- independent    Transferring- independent     Continence- independent  Overall Assessment: independent  Instrumental Activities of Daily Living:     Transportation- dependent    Meal/Food Preparation- dependent    Shopping Errands- dependent    Housekeeping/Chores- dependent    Money Management/Finances- dependent    Medication Management- dependent    Ability to Use Telephone- dependent    Laundry- dependent  Overall Assessment: fully dependent  Prior to recent hospitalizations was fully independent in IADLs and ADLs.   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 Interim medical history since our last visit reviewed. Allergies and medications reviewed and updated. Outpatient Medications Prior to Visit  Medication Sig Dispense Refill   acetaminophen (TYLENOL) 325 MG tablet Take 650 mg by mouth as needed for moderate pain or mild pain.     albuterol (VENTOLIN HFA) 108 (90  Base) MCG/ACT inhaler Inhale 2 puffs into the lungs every 6 (six) hours as needed for wheezing or shortness of breath. 18 g 2   Ascorbic Acid (VITAMIN C) 1000 MG tablet Take 1  tablet (1,000 mg total) by mouth daily.     aspirin 81 MG EC tablet Take 81 mg by mouth daily.       Calcium-Magnesium-Vitamin D (CALCIUM 1200+D3 PO) Take 1 tablet by mouth daily.     Carboxymethylcellul-Glycerin (LUBRICATING EYE DROPS OP) Place 1 drop into both eyes daily as needed (irritation).     Cholecalciferol (VITAMIN D3) 50 MCG (2000 UT) TABS Take 2,000 Units by mouth daily. 30 tablet    Cyanocobalamin (B-12) 5000 MCG CAPS Take 5,000 mcg by mouth daily.     docusate sodium (COLACE) 100 MG capsule Take 200 mg by mouth at bedtime.     finasteride (PROSCAR) 5 MG tablet Take 1 tablet (5 mg total) by mouth daily. 30 tablet 6   fluticasone (FLONASE) 50 MCG/ACT nasal spray Place 2 sprays into both nostrils daily.     Lacosamide 100 MG TABS Take 100 mg by mouth 2 (two) times daily. Until 03/31/22     lidocaine-prilocaine (EMLA) cream Apply 30 -45 mins prior to port access. 30 g 3   loratadine (CLARITIN) 10 MG tablet Take 10 mg by mouth daily.     Melatonin 10 MG CAPS Take 10 mg by mouth at bedtime as needed (sleep).     nitroGLYCERIN (NITROSTAT) 0.4 MG SL tablet Place 1 tablet (0.4 mg total) under the tongue every 5 (five) minutes as needed for chest pain. 25 tablet 3   olopatadine (PATANOL) 0.1 % ophthalmic solution Place 1 drop into both eyes 2 (two) times daily. 5 mL 12   pantoprazole (PROTONIX) 40 MG tablet TAKE 1 TABLET BY MOUTH DAILY (Patient taking differently: Take 40 mg by mouth daily before breakfast.) 90 tablet 0   predniSONE (DELTASONE) 10 MG tablet Take 5 mg by mouth daily. Stop on 03/31/22     prochlorperazine (COMPAZINE) 10 MG tablet Take 1 tablet (10 mg total) by mouth every 6 (six) hours as needed for nausea or vomiting. 40 tablet 1   simvastatin (ZOCOR) 40 MG tablet Take 1 tablet (40 mg total) by mouth at bedtime. 90 tablet 3   traMADol (ULTRAM) 50 MG tablet TAKE 1 TABLET BY MOUTH 3 TIMES DAILY AS NEEDED FOR MODERATE PAIN 90 tablet 0   Vitamin E 450 MG (1000 UT) CAPS Take 450  Units by mouth daily.     Zinc 50 MG TABS Take 50 mg by mouth daily.     valsartan (DIOVAN) 80 MG tablet One daily (Patient taking differently: Take 80 mg by mouth daily.) 30 tablet 11   valsartan (DIOVAN) 80 MG tablet Take 0.5 tablets (40 mg total) by mouth daily. One daily     predniSONE (DELTASONE) 20 MG tablet Take 1 tablet (20 mg total) by mouth daily with breakfast. Take 3 pills once a day for 1 week; and then 2 pills once a day for 1 week; then 1 pill a day. 60 tablet 0   Facility-Administered Medications Prior to Visit  Medication Dose Route Frequency Provider Last Rate Last Admin   heparin lock flush 100 UNIT/ML injection              Per HPI unless specifically indicated in ROS section below Review of Systems  Objective:  BP 100/60   Pulse 92   Temp (!) 97.3 F (  36.3 C) (Temporal)   Ht 5\' 8"  (1.727 m)   Wt 211 lb 8 oz (95.9 kg)   SpO2 90%   BMI 32.16 kg/m   Wt Readings from Last 3 Encounters:  03/31/22 211 lb 8 oz (95.9 kg)  02/17/22 207 lb (93.9 kg)  02/14/22 199 lb 15.3 oz (90.7 kg)      Physical Exam Vitals and nursing note reviewed.  Constitutional:      Appearance: Normal appearance. He is not ill-appearing.  HENT:     Right Ear: Tympanic membrane, ear canal and external ear normal.     Left Ear: Tympanic membrane, ear canal and external ear normal.     Nose: Congestion present.     Mouth/Throat:     Mouth: Mucous membranes are moist.     Pharynx: Oropharynx is clear. No oropharyngeal exudate or posterior oropharyngeal erythema.  Eyes:     Extraocular Movements: Extraocular movements intact.     Pupils: Pupils are equal, round, and reactive to light.  Cardiovascular:     Rate and Rhythm: Normal rate and regular rhythm.     Pulses: Normal pulses.     Heart sounds: Normal heart sounds. No murmur heard. Pulmonary:     Effort: Pulmonary effort is normal. No respiratory distress.     Breath sounds: Rhonchi present. No wheezing or rales.     Comments:  Coarse bibasilarly L>R Neurological:     Mental Status: He is alert.  Psychiatric:        Mood and Affect: Mood normal.        Behavior: Behavior normal.       Assessment & Plan:   Problem List Items Addressed This Visit     Essential hypertension    BP low today - rec drop valsartan to 40mg  daily (1/2 tab daily).       Relevant Medications   valsartan (DIOVAN) 80 MG tablet   Nasal sinus congestion    Nasal sinus congestion and frontal headache present over the past week.  ?allergy related - discussed changing antihistamine brands and adding flonase, guaifenesin. No signs of bacterial infection at this time.       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  Encouraged onc f/u.       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 Daughter notes ongoing disorientation/confusion ?unmasking dementia. New dependence in IADLs, daughters are having to stay with him. Will refer to neurology. Will also ask HH to provide SN and nurse aide given new need in ADLs/IADLs ie showering.       Relevant Orders   Ambulatory referral to Neurology   Ambulatory referral to Home Health   ILD (interstitial lung disease) ([place])    Currently finishing prednisone taper. Will need pulm f/u. Start with neurology.       Impaired instrumental activities of daily living (IADL)    Daughter notes they need more assistance at home.  Will ask HH to provide SN/nurse aide in short term.  Discussed possible need for private personal care services once [facility] ends.  He has history of military service - I asked daughter to check with case manager about looking into [place] benefits as he  may qualify for further assistance at home.       Other Visit  Diagnoses     Confusion and disorientation       Relevant Orders   Ambulatory referral to [place]        No orders of the defined types were placed in this encounter.  Orders Placed This Encounter  Procedures   Ambulatory referral to Neurology    Referral Priority:   Routine    Referral Type:   Consultation    Referral Reason:   Specialty Services Required    Requested Specialty:   Neurology    Number of Visits Requested:   1   Ambulatory referral to Home Health    Referral Priority:   Routine    Referral Type:   [place]    Referral Reason:   Specialty Services Required    Requested Specialty:   [place]    Number of Visits Requested:   1    Patient Instructions  Blood pressures are running a bit too low - drop valsartan dose to 1/2 tablet daily (40mg  daily).  Try different antihistamine brand instead of claritin. Try flonase. May take guaifenesin (plain mucinex) with large glass of water to help mobilize mucous out. I don't see obvious infection at this time.  My office will reach out to home health about skilled nursing assessment.  I'd like to refer you to neurology in [place] as well for ongoing memory difficulty after encephalitis.  Follow up with Dr Aletha Halim office for appointment.  Keep next week's appointment, come in fasting.  Cancel lab visit for tomorrow.   Follow up plan: No follow-ups on file.  Ria Bush, MD

## 2022-04-01 ENCOUNTER — Telehealth: Payer: Self-pay | Admitting: Internal Medicine

## 2022-04-01 ENCOUNTER — Other Ambulatory Visit: Payer: PPO

## 2022-04-01 ENCOUNTER — Encounter: Payer: Self-pay | Admitting: Family Medicine

## 2022-04-01 ENCOUNTER — Other Ambulatory Visit: Payer: Self-pay | Admitting: Internal Medicine

## 2022-04-01 DIAGNOSIS — C3432 Malignant neoplasm of lower lobe, left bronchus or lung: Secondary | ICD-10-CM

## 2022-04-01 DIAGNOSIS — Z789 Other specified health status: Secondary | ICD-10-CM | POA: Insufficient documentation

## 2022-04-01 NOTE — Assessment & Plan Note (Signed)
Daughter notes they need more assistance at home.  Will ask HH to provide SN/nurse aide in short term.  Discussed possible need for private personal care services once [facility] ends.  He has history of military service - I asked daughter to check with case manager about looking into [place] benefits as he may qualify for further assistance at home.

## 2022-04-01 NOTE — Assessment & Plan Note (Signed)
BP low today - rec drop valsartan to 40mg  daily (1/2 tab daily).

## 2022-04-01 NOTE — Assessment & Plan Note (Signed)
Nasal sinus congestion and frontal headache present over the past week.  ?allergy related - discussed changing antihistamine brands and adding flonase, guaifenesin. No signs of bacterial infection at this time.

## 2022-04-01 NOTE — Telephone Encounter (Signed)
Patients daughter called and asked for follow up appointment- she states he has been in the hospital with a couple of infections and needs a follow up- please advise on scheduling.

## 2022-04-01 NOTE — Telephone Encounter (Signed)
Lvm (on confidential vm) for Randall Ali, of [facility], informing her Dr. Darnell Level is giving verbal orders for speech therapy.  I also relayed Dr. Synthia Innocent message concerning additional [place] orders.

## 2022-04-01 NOTE — Assessment & Plan Note (Signed)
Has completed treatment with valacyclovir (2 wks ago) and vimpat (last dose today). Daughter notes ongoing disorientation/confusion ?unmasking dementia. New dependence in IADLs, daughters are having to stay with him. Will refer to neurology. Will also ask HH to provide SN and nurse aide given new need in ADLs/IADLs ie showering.

## 2022-04-01 NOTE — Assessment & Plan Note (Signed)
Currently finishing prednisone taper. Will need pulm f/u. Start with neurology.

## 2022-04-01 NOTE — Assessment & Plan Note (Signed)
Keytruda on hold since 02/06/2022.  Encouraged onc f/u.

## 2022-04-02 DIAGNOSIS — I739 Peripheral vascular disease, unspecified: Secondary | ICD-10-CM | POA: Diagnosis not present

## 2022-04-02 DIAGNOSIS — D692 Other nonthrombocytopenic purpura: Secondary | ICD-10-CM | POA: Diagnosis not present

## 2022-04-02 DIAGNOSIS — Z8619 Personal history of other infectious and parasitic diseases: Secondary | ICD-10-CM | POA: Diagnosis not present

## 2022-04-02 DIAGNOSIS — Z7982 Long term (current) use of aspirin: Secondary | ICD-10-CM | POA: Diagnosis not present

## 2022-04-02 DIAGNOSIS — I10 Essential (primary) hypertension: Secondary | ICD-10-CM | POA: Diagnosis not present

## 2022-04-02 NOTE — Telephone Encounter (Signed)
Agree with this. Thanks.  

## 2022-04-02 NOTE — Telephone Encounter (Signed)
Home Health verbal orders [place] Agency Name: [facility] number: [phone]  Requesting OT/PT/Skilled nursing/Social Work/Speech:  Reason:to extension of PT   Frequency: 1 X W  1 W  Please forward to [employer] pool or providers CMA

## 2022-04-03 ENCOUNTER — Encounter: Payer: Self-pay | Admitting: Neurology

## 2022-04-03 NOTE — Telephone Encounter (Signed)
Randall Ali from [place] returned call advise her Dr. Darnell Level is giving verbal order for services requested

## 2022-04-03 NOTE — Telephone Encounter (Signed)
Lvm asking Raquel Sarna of [facility], to call back.  Need to inform her Dr. Darnell Level is giving verbal order for services requested.

## 2022-04-03 NOTE — Telephone Encounter (Signed)
Noted  

## 2022-04-04 ENCOUNTER — Inpatient Hospital Stay: Payer: PPO | Attending: Internal Medicine | Admitting: Internal Medicine

## 2022-04-04 ENCOUNTER — Inpatient Hospital Stay: Payer: PPO

## 2022-04-04 ENCOUNTER — Encounter: Payer: Self-pay | Admitting: Internal Medicine

## 2022-04-04 DIAGNOSIS — C3432 Malignant neoplasm of lower lobe, left bronchus or lung: Secondary | ICD-10-CM | POA: Insufficient documentation

## 2022-04-04 DIAGNOSIS — Z955 Presence of coronary angioplasty implant and graft: Secondary | ICD-10-CM | POA: Insufficient documentation

## 2022-04-04 DIAGNOSIS — R410 Disorientation, unspecified: Secondary | ICD-10-CM | POA: Diagnosis not present

## 2022-04-04 DIAGNOSIS — Z7902 Long term (current) use of antithrombotics/antiplatelets: Secondary | ICD-10-CM | POA: Insufficient documentation

## 2022-04-04 DIAGNOSIS — Z87891 Personal history of nicotine dependence: Secondary | ICD-10-CM | POA: Insufficient documentation

## 2022-04-04 DIAGNOSIS — Z79899 Other long term (current) drug therapy: Secondary | ICD-10-CM | POA: Insufficient documentation

## 2022-04-04 DIAGNOSIS — I252 Old myocardial infarction: Secondary | ICD-10-CM | POA: Diagnosis not present

## 2022-04-04 DIAGNOSIS — I2510 Atherosclerotic heart disease of native coronary artery without angina pectoris: Secondary | ICD-10-CM | POA: Diagnosis not present

## 2022-04-04 DIAGNOSIS — Z7982 Long term (current) use of aspirin: Secondary | ICD-10-CM | POA: Insufficient documentation

## 2022-04-04 LAB — CBC WITH DIFFERENTIAL/PLATELET
Abs Immature Granulocytes: 0.22 10*3/uL — ABNORMAL HIGH (ref 0.00–0.07)
Basophils Absolute: 0.1 10*3/uL (ref 0.0–0.1)
Basophils Relative: 1 %
Eosinophils Absolute: 0.1 10*3/uL (ref 0.0–0.5)
Eosinophils Relative: 2 %
HCT: 41.3 % (ref 39.0–52.0)
Hemoglobin: 14.2 g/dL (ref 13.0–17.0)
Immature Granulocytes: 3 %
Lymphocytes Relative: 18 %
Lymphs Abs: 1.2 10*3/uL (ref 0.7–4.0)
MCH: 32 pg (ref 26.0–34.0)
MCHC: 34.4 g/dL (ref 30.0–36.0)
MCV: 93 fL (ref 80.0–100.0)
Monocytes Absolute: 0.7 10*3/uL (ref 0.1–1.0)
Monocytes Relative: 10 %
Neutro Abs: 4.7 10*3/uL (ref 1.7–7.7)
Neutrophils Relative %: 66 %
Platelets: 210 10*3/uL (ref 150–400)
RBC: 4.44 MIL/uL (ref 4.22–5.81)
RDW: 15.6 % — ABNORMAL HIGH (ref 11.5–15.5)
WBC: 7.1 10*3/uL (ref 4.0–10.5)
nRBC: 0 % (ref 0.0–0.2)

## 2022-04-04 LAB — COMPREHENSIVE METABOLIC PANEL
ALT: 14 U/L (ref 0–44)
AST: 18 U/L (ref 15–41)
Albumin: 4.1 g/dL (ref 3.5–5.0)
Alkaline Phosphatase: 57 U/L (ref 38–126)
Anion gap: 7 (ref 5–15)
BUN: 14 mg/dL (ref 8–23)
CO2: 24 mmol/L (ref 22–32)
Calcium: 9.2 mg/dL (ref 8.9–10.3)
Chloride: 108 mmol/L (ref 98–111)
Creatinine, Ser: 0.9 mg/dL (ref 0.61–1.24)
GFR, Estimated: >60 mL/min (ref >60)
Glucose, Bld: 103 mg/dL — ABNORMAL HIGH (ref 70–99)
Potassium: 4 mmol/L (ref 3.5–5.1)
Sodium: 139 mmol/L (ref 135–145)
Total Bilirubin: 0.6 mg/dL (ref 0.3–1.2)
Total Protein: 7.1 g/dL (ref 6.5–8.1)

## 2022-04-04 MED ORDER — HEPARIN SOD (PORK) LOCK FLUSH 100 UNIT/ML IV SOLN
500.0000 [IU] | Freq: Once | INTRAVENOUS | Status: AC
  Administered 2022-04-04: 500 [IU] via INTRAVENOUS
  Filled 2022-04-04: qty 5

## 2022-04-04 NOTE — Progress Notes (Signed)
[facility] NOTE  Patient Care Team: Ria Bush, MD as PCP - General (Family Medicine) [name] [name], MD as PCP - Cardiology (Cardiology) [name], MD (Internal Medicine) Minna Merritts, MD as Consulting Physician (Cardiology) Pieter Partridge, DO as Consulting Physician (Neurology) Cammie Sickle, MD as Consulting Physician (Hematology and Oncology) Telford Nab, RN as Oncology Nurse Navigator  CHIEF COMPLAINTS/PURPOSE OF CONSULTATION: lung cancer    Oncology History Overview Note  #MAY 2022-Lung cancer-non-small cell [CT guided bx] T3N1 vs stage IV Dr.Hendrickson.  MRI brain negative for malignancy.# 1. April 2022- LLL ~4.0 cm mass in the superior segment left lower lobe abuts the major fissure and the posterior pleural surface without visible chest wall invasion without pleural effusion. There 2-3 other small nodules in the left lower lobe, largest measures 9 mm in diameter. These are suspicious for same lobe satellite lesions and there is left hilar adenopathy. Assuming non-small cell lung cancer the appearance is compatible with T3 N1 M0 disease (stage IIIA).  # right suprarenal nodule-awaiting biopsy on 5/31- non-small cell [revived at tumor conference at [place]  # June 9th 2022- CARBO-TAXOl-KEYTRUDA; Fulphila  # ? Subtle adrenal insufficiency- No acute process - -September MRI brain negative for any pituitary hypophysitis/brain metastasis. Prednisone   MOLECULAR TESTING: NGS TPS-PDL 100%; EXON 12 amplification* mutations.  # CAD [CABG 2016; Dr.Gollan]   Cancer of lower lobe of left lung ([place])  11/02/2020 Initial Diagnosis   Cancer of lower lobe of left lung ([place])   11/02/2020 Cancer Staging   Staging form: Lung, AJCC 8th Edition - Clinical: Stage IVA (cT3, cN1, cM1a) - Signed by Cammie Sickle, MD on 11/02/2020   11/16/2020 - 01/02/2022 Chemotherapy   Patient is on Treatment Plan : LUNG NSCLC Carboplatin + Paclitaxel +  Pembrolizumab q21d x 4 cycles / Pembrolizumab Maintenance Q21D     11/16/2020 -  Chemotherapy   Patient is on Treatment Plan : LUNG NSCLC Carboplatin (6) + Paclitaxel (200) + Pembrolizumab (200) D1 q21d x 4 cycles / Pembrolizumab (200) Maintenance D1 q21d        HISTORY OF PRESENTING ILLNESS: Accompanied by daughter.  Walking independently.  [name] 81 y.o.  male lung cancer-non-small cell stage IV [supra-renal nodule s/p biopsy] currently on Keytruda is for follow-up/ review the results of the recent CT scan chest done in the hospital..   Patient has had multiple evaluations in symptom management clinic for pneumonia/infections.  And also evaluation/admission to hospital for UTI.  Patient last [name] was in January 02, 2022.  In the patient was admitted to [facility] in [place]-diagnosed with HSV-2 encephalitis; extensive work-up including EEG negative.  Patient s/p treatment with prednisone; seizure prophylaxis; and valacyclovir.  Patient's breathing is improved.  However continues to have episodes of delirium.  History of falls.  Currently living with his daughter. Denies any tingling or numbness in the extremities. No nausea no vomiting no headaches.  Review of Systems  Constitutional:  Positive for malaise/fatigue. Negative for chills, diaphoresis, fever and weight loss.  HENT:  Negative for nosebleeds and sore throat.   Eyes:  Negative for double vision.  Respiratory:  Negative for cough, hemoptysis, sputum production, shortness of breath and wheezing.   Cardiovascular:  Negative for chest pain, palpitations, orthopnea and leg swelling.  Gastrointestinal:  Positive for nausea. Negative for abdominal pain, blood in stool, constipation, diarrhea, heartburn, melena and vomiting.  Genitourinary:  Negative for dysuria, frequency and urgency.  Musculoskeletal:  Positive for back pain and joint pain.  Skin: Negative.  Negative for itching and rash.  Neurological:  Negative  for dizziness, tingling, focal weakness, weakness and headaches.  Endo/Heme/Allergies:  Does not bruise/bleed easily.  Psychiatric/Behavioral:  Negative for depression. The patient is not nervous/anxious and does not have insomnia.      MEDICAL HISTORY:  Past Medical History:  Diagnosis Date   Allergy    seasonal   Arthritis    all over- in general    CAD (coronary artery disease)    a. inferior wall MI 10/01 s/p PCI/DES to RCA; b. Myoview 3/16 neg for ischemia; c. LHC 8/16: ostLAD 80%, OM1 70%, OM2 70% x 2 lesions, mRCA 30%, dRCA 70% s/p 4-V CABG 01/24/15 (LIMA-LAD, VG- OM1, VG-OM2, VG-PDA)    Cancer (HCC)    skin, melanoma   Carotid artery disease ([place])    a. [place] 8/16: 1-39% bilateral ICA stenosis   Cataract    removed   Diastolic dysfunction    a. TTE 8/16: EF 55-60%, no RWMA, Gr1DD, calcified mitral annulus, mild biatrial enlargement   Erectile dysfunction    GERD (gastroesophageal reflux disease)    History of elbow surgery    History of hiatal hernia    HLD (hyperlipidemia)    HTN (hypertension)    Inferior myocardial infarction ([place]) 03/2000   stent RCA   Lung cancer ([place])    Postoperative wound infection 02/02/2015   Reflux esophagitis    Sleep apnea 2017   CPAP at night    SURGICAL HISTORY: Past Surgical History:  Procedure Laterality Date   arm surgery  2010   BROW LIFT Bilateral 11/25/2019   Procedure: BROW PTOSIS REPAIR BILATERAL;  Surgeon: Karle Starch, MD;  Location: [facility];  Service: Ophthalmology;  Laterality: Bilateral;  sleep apnea   CARDIAC CATHETERIZATION  06/24/2011   CARDIAC CATHETERIZATION N/A 01/18/2015   Procedure: Left Heart Cath with coronary angiography;  Surgeon: Minna Merritts, MD;  Location: [facility];  Service: Cardiovascular;  Laterality: N/A;   CARDIAC CATHETERIZATION N/A 01/18/2015   Procedure: Intravascular Pressure Wire/FFR Study;  Surgeon: [name], MD;  Location: [facility];  Service:  Cardiovascular;  Laterality: N/A;   CAROTID STENT  03/10/2011   COLONOSCOPY  2010   COLONOSCOPY  06/14/2014   Dr Hilarie Fredrickson   COLONOSCOPY  01/2022   done for colon thickening at splenic flexure - 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[name])   CORONARY ARTERY BYPASS GRAFT N/A 01/24/2015   Procedure: CORONARY ARTERY BYPASS GRAFTING x 4 (LIMA-LAD, SVG-Int 1- Int 2, SVG-PD) ENDOSCOPIC GREATER SAPHENOUS VEIN HARVEST LEFT LEG;  Surgeon: [name], MD;  Location: [facility];  Service: Open Heart Surgery;  Laterality: N/A;   EMBOLECTOMY  06/15/2019   Procedure: EMBOLECTOMY;  Surgeon: Katha Cabal, MD;  Location: [facility];  Service: Vascular;;  right superficial femoral artery   ENDARTERECTOMY FEMORAL Right 06/15/2019   Procedure: ENDARTERECTOMY FEMORAL;  Surgeon: Katha Cabal, MD;  Location: [facility];  Service: Vascular;  Laterality: Right;  common femoral profunda femoris superficial femoral   ESOPHAGOGASTRODUODENOSCOPY (EGD) WITH PROPOFOL N/A 04/24/2016   Procedure: ESOPHAGOGASTRODUODENOSCOPY (EGD) WITH PROPOFOL;  Surgeon: Jerene Bears, MD;  [facility];  Service: Gastroenterology;  Laterality: N/A;   EYE SURGERY     lasik 15 yrs. ago, cataracts removed - both eyes    HAMMER TOE SURGERY     right toe   INSERTION OF ILIAC STENT Right  06/15/2019   Procedure: INSERTION OF ILIAC STENT ( STENTING OF SFA/POP ARTERY );  Surgeon: Katha Cabal, MD;  Location: [facility];  Service: Vascular;  Laterality: Right;  angioplpasty and stent placement: right superficial femoral right tibiopopliteal trunk bilateral common iliac arteries   IR IMAGING GUIDED PORT INSERTION  11/09/2020   LEFT HEART CATH AND CORONARY ANGIOGRAPHY Left 06/10/2017   Procedure: LEFT HEART CATH AND CORONARY ANGIOGRAPHY;  Surgeon: Minna Merritts, MD;  Location: [facility];  Service: Cardiovascular;  Laterality: Left;   LOWER EXTREMITY ANGIOGRAPHY Left 01/04/2019   Procedure: LOWER EXTREMITY  ANGIOGRAPHY;  Surgeon: Katha Cabal, MD;  Location: [facility];  Service: Cardiovascular;  Laterality: Left;   LOWER EXTREMITY ANGIOGRAPHY Right 01/25/2019   Procedure: LOWER EXTREMITY ANGIOGRAPHY;  Surgeon: Katha Cabal, MD;  Location: [facility];  Service: Cardiovascular;  Laterality: Right;   LOWER EXTREMITY ANGIOGRAPHY Right 01/15/2021   LOWER EXTREMITY ANGIOGRAPHY and stent placement to R SFA and popliteal artery Delana Meyer, Dolores Lory, MD)   NASAL SINUS SURGERY  2008   septpolasty, bilateral turbinate reduction   SHOULDER ARTHROSCOPY  2012   TEE WITHOUT CARDIOVERSION N/A 01/24/2015   Procedure: TRANSESOPHAGEAL ECHOCARDIOGRAM (TEE);  Surgeon: [name], MD;  Location: [facility];  Service: Open Heart Surgery;  Laterality: N/A;   TOE SURGERY  1994   UPPER GI ENDOSCOPY  07/2014, 04-24-16   Dr Raquel James   WRIST SURGERY  2011    SOCIAL HISTORY: Social History   Socioeconomic History   Marital status: Single    Spouse name: Not on file   Number of children: Not on file   Years of education: 12   Highest education level: Not on file  Occupational History   Occupation: retired    Comment: [employer]  Tobacco Use   Smoking status: Former    Packs/day: 2.50    Years: 40.00    Total pack years: 100.00    Types: Cigarettes    Quit date: 03/24/2000    Years since quitting: 22.0   Smokeless tobacco: Never  Vaping Use   Vaping Use: Never used  Substance and Sexual Activity   Alcohol use: Yes    Comment: occassionally   Drug use: No   Sexual activity: Yes  Other Topics Concern   Not on file  Social History Narrative   Singled; lives with son and dog    Occ: retired, back part time at [employer];    Activity: gym 4-5d/wk   Diet: good water, fruits/vegetables daily   Caffeine Use-yes      H/o Armed forces logistics/support/administrative officer   ------------------------------------       Leisure centre manager- retd; [employer] store- retd; quit smoking 2001. Alcohol couple nights a week. Live in  [place]. Daughter lives 10 mins.    Social Determinants of Health   Financial Resource Strain: Low Risk  (09/26/2021)   Overall Financial Resource Strain (CARDIA)    Difficulty of Paying Living Expenses: Not hard at all  Food Insecurity: No Food Insecurity (02/14/2022)   Hunger Vital Sign    Worried About Running Out of Food in the Last Year: Never true    Ran Out of Food in the Last Year: Never true  Transportation Needs: No Transportation Needs (02/14/2022)   PRAPARE - Hydrologist (Medical): No    Lack of Transportation (Non-Medical): No  Physical Activity: Sufficiently Active (09/26/2021)   Exercise Vital Sign    Days  of Exercise per Week: 5 days    Minutes of Exercise per Session: 30 min  Stress: No Stress Concern Present (09/26/2021)   [place]    Feeling of Stress : Only a little  Social Connections: Socially Isolated (09/26/2021)   Social Connection and Isolation Panel [NHANES]    Frequency of Communication with Friends and Family: Twice a week    Frequency of Social Gatherings with Friends and Family: Twice a week    Attends Religious Services: Never    Marine scientist or Organizations: Not on file    Attends Archivist Meetings: Never    Marital Status: Never married  Intimate Partner Violence: Not At Risk (02/14/2022)   Humiliation, Afraid, Rape, and Kick questionnaire    Fear of Current or Ex-Partner: No    Emotionally Abused: No    Physically Abused: No    Sexually Abused: No    FAMILY HISTORY: Family History  Problem Relation Age of Onset   Hypertension Mother    Heart disease Mother    Hypertension Father    Diabetes Father    Lymphoma Sister    Heart disease Brother 14   Cancer Paternal Grandfather    Colon cancer Neg Hx    Prostate cancer Neg Hx    Bladder Cancer Neg Hx    Kidney cancer Neg Hx    Colon polyps Neg Hx    Esophageal cancer Neg Hx     Pancreatic cancer Neg Hx    Stomach cancer Neg Hx     ALLERGIES:  has No Known Allergies.  MEDICATIONS:  Current Outpatient Medications  Medication Sig Dispense Refill   acetaminophen (TYLENOL) 325 MG tablet Take 650 mg by mouth as needed for moderate pain or mild pain.     albuterol (VENTOLIN HFA) 108 (90 Base) MCG/ACT inhaler Inhale 2 puffs into the lungs every 6 (six) hours as needed for wheezing or shortness of breath. 18 g 2   Ascorbic Acid (VITAMIN C) 1000 MG tablet Take 1 tablet (1,000 mg total) by mouth daily.     aspirin 81 MG EC tablet Take 81 mg by mouth daily.       Calcium-Magnesium-Vitamin D (CALCIUM 1200+D3 PO) Take 1 tablet by mouth daily.     Carboxymethylcellul-Glycerin (LUBRICATING EYE DROPS OP) Place 1 drop into both eyes daily as needed (irritation).     Cholecalciferol (VITAMIN D3) 50 MCG (2000 UT) TABS Take 2,000 Units by mouth daily. 30 tablet    Cyanocobalamin (B-12) 5000 MCG CAPS Take 5,000 mcg by mouth daily.     docusate sodium (COLACE) 100 MG capsule Take 200 mg by mouth at bedtime.     finasteride (PROSCAR) 5 MG tablet Take 1 tablet (5 mg total) by mouth daily. 30 tablet 6   fluticasone (FLONASE) 50 MCG/ACT nasal spray Place 2 sprays into both nostrils daily.     guaiFENesin (MUCINEX) 600 MG 12 hr tablet Take by mouth 2 (two) times daily.     lidocaine-prilocaine (EMLA) cream Apply 30 -45 mins prior to port access. 30 g 3   loratadine (CLARITIN) 10 MG tablet Take 10 mg by mouth daily.     Melatonin 10 MG CAPS Take 10 mg by mouth at bedtime as needed (sleep).     nitroGLYCERIN (NITROSTAT) 0.4 MG SL tablet Place 1 tablet (0.4 mg total) under the tongue every 5 (five) minutes as needed for chest pain. 25 tablet 3  olopatadine (PATANOL) 0.1 % ophthalmic solution Place 1 drop into both eyes 2 (two) times daily. 5 mL 12   pantoprazole (PROTONIX) 40 MG tablet TAKE 1 TABLET BY MOUTH DAILY (Patient taking differently: Take 40 mg by mouth daily before breakfast.) 90  tablet 0   prochlorperazine (COMPAZINE) 10 MG tablet Take 1 tablet (10 mg total) by mouth every 6 (six) hours as needed for nausea or vomiting. 40 tablet 1   simvastatin (ZOCOR) 40 MG tablet Take 1 tablet (40 mg total) by mouth at bedtime. 90 tablet 3   traMADol (ULTRAM) 50 MG tablet TAKE 1 TABLET BY MOUTH 3 TIMES DAILY AS NEEDED FOR MODERATE PAIN 90 tablet 0   valsartan (DIOVAN) 80 MG tablet Take 0.5 tablets (40 mg total) by mouth daily. One daily     Vitamin E 450 MG (1000 UT) CAPS Take 450 Units by mouth daily.     Zinc 50 MG TABS Take 50 mg by mouth daily.     No current facility-administered medications for this visit.   Facility-Administered Medications Ordered in Other Visits  Medication Dose Route Frequency Provider Last Rate Last Admin   heparin lock flush 100 UNIT/ML injection               .  PHYSICAL EXAMINATION: ECOG PERFORMANCE STATUS: 0 - Asymptomatic  Vitals:   04/04/22 0918  BP: 108/61  Pulse: 82  Resp: 18  Temp: 98.5 F (36.9 C)  SpO2: 94%      Filed Weights   04/04/22 0918  Weight: 214 lb (97.1 kg)        Physical Exam HENT:     Head: Normocephalic and atraumatic.     Mouth/Throat:     Pharynx: No oropharyngeal exudate.  Eyes:     Pupils: Pupils are equal, round, and reactive to light.  Cardiovascular:     Rate and Rhythm: Normal rate and regular rhythm.  Pulmonary:     Effort: No respiratory distress.     Breath sounds: No wheezing.     Comments: Decreased air entry bilaterally. Abdominal:     General: Bowel sounds are normal. There is no distension.     Palpations: Abdomen is soft. There is no mass.     Tenderness: There is no abdominal tenderness. There is no guarding or rebound.  Musculoskeletal:        General: No tenderness. Normal range of motion.     Cervical back: Normal range of motion and neck supple.  Skin:    General: Skin is warm.  Neurological:     Mental Status: He is alert and oriented to person, place, and time.   Psychiatric:        Mood and Affect: Affect normal.      LABORATORY DATA:  I have reviewed the data as listed Lab Results  Component Value Date   WBC 7.1 04/04/2022   HGB 14.2 04/04/2022   HCT 41.3 04/04/2022   MCV 93.0 04/04/2022   PLT 210 04/04/2022   Recent Labs    02/12/22 0525 02/13/22 0410 02/14/22 0532 02/17/22 1152 04/04/22 0852  NA 135 132* 133* 134* 139  K 3.8 3.8 4.0 4.3 4.0  CL 104 101 102 100 108  CO2 24 22 23 26 24   GLUCOSE 111* 116* 118* 113* 103*  BUN 19 19 22  25* 14  CREATININE 1.09 1.11 1.05 1.41 0.90  CALCIUM 8.8* 8.8* 9.1 9.9 9.2  GFRNONAA >60 >60 >60  --  >60  PROT 6.4*  --   --  6.7 7.1  ALBUMIN 3.7  --   --  3.9 4.1  AST 13*  --   --  14 18  ALT 15  --   --  16 14  ALKPHOS 55  --   --  46 57  BILITOT 0.6  --   --  1.2 0.6    RADIOGRAPHIC STUDIES: I have personally reviewed the radiological images as listed and agreed with the findings in the report. No results found.   ASSESSMENT & PLAN:   Cancer of lower lobe of left lung ([place]) # LLL- Lung cancer-non-small cell stage IV [right suprarenal nodule-s/p biopsy "non-small cell ca"].  S/p  CarboTaxol plus Keytruda x4; currently on single agent Keytruda-; CTA scan- SEP 25th, 2023- [hospital]-  No evidence of pulmonary embolism; The treated lesion in the superior segment of the left lower lobe appears stable compared to the prior study. No new suspicious appearing pulmonary nodules or lymphadenopathy noted elsewhere in the thorax.   # Last [name] was July, 27th, 2023- Recommend holding Keytruda at this time given the absence of any progressive disease; multiple admissions in the hospital/infection delirium etc.  Question related to [place] ? HSV-2 encephalitis.  We will consider giving the patient a break from therapy/as there is no evidence of improvement in disease on imaging and also given patient's poor tolerance.     #Frequent mental status status changes-?  Delirium from infection versus  others.; MRI Brain SEP 2023 [hospital]-no obvious progression of malignancy.  However [facility]- OCT 2023-HSV encephalitis s/p treatment.  Given ongoing delirium- recommend further evaluation with Dr. Mickeal Skinner.  Discussed with Dr. Mickeal Skinner; currently agrees to evaluate the patient in 2 weeks from now.   # Acute on chronic back pain- BIL radiating lower extremitie- s/p evaluation [[place]]; JUNE 2023 MRI-no cancer; s/p injection.  on tramadol 50 mg BID prn.  on PT- STABLE.  # CAD-s/p CABG-/PVD-s/p stent [Dr.Schneir]-improvement in pain noted.  On Plavix --STABLE.  # IV access: Mediport functioning.  # DISPOSITION: # De-Access; NO treatment today  # refer to Dr.Vaslow re: HSV encephalitis/confusion  # follow up in 5 weeks with MD; port/ labs-cbc/cmp;TSH- No chemo- Dr.B          All  questions were answered. The patient knows to call the clinic with any problems, questions or concerns.    Cammie Sickle, MD 04/04/2022 8:04 PM

## 2022-04-04 NOTE — Assessment & Plan Note (Addendum)
#   LLL- Lung cancer-non-small cell stage IV [right suprarenal nodule-s/p biopsy "non-small cell ca"].  S/p  CarboTaxol plus Keytruda x4; currently on single agent Keytruda-; CTA scan- SEP 25th, 2023- [hospital]-  No evidence of pulmonary embolism; The treated lesion in the superior segment of the left lower lobe appears stable compared to the prior study. No new suspicious appearing pulmonary nodules or lymphadenopathy noted elsewhere in the thorax.   # Last [name] was July, 27th, 2023- Recommend holding Keytruda at this time given the absence of any progressive disease; multiple admissions in the hospital/infection delirium etc.  Question related to [place] ? HSV-2 encephalitis.  We will consider giving the patient a break from therapy/as there is no evidence of improvement in disease on imaging and also given patient's poor tolerance.     #Frequent mental status status changes-?  Delirium from infection versus others.; MRI Brain SEP 2023 [hospital]-no obvious progression of malignancy.  However [facility]- OCT 2023-HSV encephalitis s/p treatment.  Given ongoing delirium- recommend further evaluation with Dr. Mickeal Skinner.  Discussed with Dr. Mickeal Skinner; currently agrees to evaluate the patient in 2 weeks from now.   # Acute on chronic back pain- BIL radiating lower extremitie- s/p evaluation [[place]]; JUNE 2023 MRI-no cancer; s/p injection.  on tramadol 50 mg BID prn.  on PT- STABLE.  # CAD-s/p CABG-/PVD-s/p stent [Dr.Schneir]-improvement in pain noted.  On Plavix --STABLE.  # IV access: Mediport functioning.  # DISPOSITION: # De-Access; NO treatment today  # refer to Dr.Vaslow re: HSV encephalitis/confusion  # follow up in 5 weeks with MD; port/ labs-cbc/cmp;TSH- No chemo- Dr.B

## 2022-04-04 NOTE — Progress Notes (Signed)
Patient here for oncology follow-up appointment, concerns of fatigue, confusion and SOB

## 2022-04-07 ENCOUNTER — Encounter (INDEPENDENT_AMBULATORY_CARE_PROVIDER_SITE_OTHER): Payer: Self-pay

## 2022-04-07 ENCOUNTER — Telehealth: Payer: Self-pay

## 2022-04-07 NOTE — Telephone Encounter (Signed)
Unable to reach [facility] speech therapist with [facility]; left v/m that Randall Ali could call [facility]; I spoke with [facility] that Randall Ali had left v/m and [name] said that she had already spoken with Randall Ali and that pt is in no distress and pt already has appt to see [name] on 04/08/22 for CPX. [name] said nothing urgent and pt will be at appt on 04/08/22 to see [name]. Sending note to [name] and Lattie Haw CMA.

## 2022-04-07 NOTE — Telephone Encounter (Signed)
[name] - Client TELEPHONE ADVICE RECORD [facility] Patient Name: Randall Ali LIVER Gender: Male DOB: 03-06-1941 Age: 81 Y 1 M 6 D Return Phone Number: [phone] (Primary), [phone] (Secondary) Address: City/ State/ Zip: [place]  [address] [name] - Client Client Site [facility] Provider Ria Bush - MD Contact Type Call Who Is Calling Patient / Member / Family / Caregiver Call Type Triage / Clinical Caller Name Barnett Applebaum Relationship To Patient Care Giver Return Phone Number [phone] (Primary) Chief Complaint Abdominal Pain Reason for Call Symptomatic / Request for [place] states the patient is constipated and his weight is not normal. He gained an abnormal amount of weight. He has abdominal pain. Translation No No Triage Reason Patient declined Nurse Assessment Nurse: Patsey Berthold, RN, Roma Kayser Date/Time Eilene Ghazi Time): 04/07/2022 2:25:54 PM Confirm and document reason for call. If symptomatic, describe symptoms. ---Caller states the patient is constipated and his weight is not normal. He gained an abnormal amount of weight. He has abdominal pain. Does the patient have any new or worsening symptoms? ---Yes Will a triage be completed? ---No [facility] reason for no triage. ---Patient declined Disp. Time Eilene Ghazi Time) Disposition Final User 04/07/2022 2:08:51 PM Attempt made - message left Vivi Ferns 04/07/2022 2:26:30 PM Clinical Call Yes Patsey Berthold, RN, Roma Kayser Final Disposition 04/07/2022 2:26:30 PM Clinical Call Yes Patsey Berthold, RN, Roma Kayser Comments User: Diana Eves, RN Date/Time Eilene Ghazi Time): 04/07/2022 2:25:35 PM Made contact with Barnett Applebaum- caregiver who states she will wait for office to call her back. Said thank you and hung up

## 2022-04-08 ENCOUNTER — Ambulatory Visit (INDEPENDENT_AMBULATORY_CARE_PROVIDER_SITE_OTHER): Payer: PPO | Admitting: Family Medicine

## 2022-04-08 ENCOUNTER — Encounter: Payer: Self-pay | Admitting: Family Medicine

## 2022-04-08 VITALS — BP 134/66 | HR 100 | Temp 98.4°F | Ht 66.5 in | Wt 212.0 lb

## 2022-04-08 DIAGNOSIS — J849 Interstitial pulmonary disease, unspecified: Secondary | ICD-10-CM | POA: Diagnosis not present

## 2022-04-08 DIAGNOSIS — Z23 Encounter for immunization: Secondary | ICD-10-CM | POA: Diagnosis not present

## 2022-04-08 DIAGNOSIS — Z7189 Other specified counseling: Secondary | ICD-10-CM | POA: Diagnosis not present

## 2022-04-08 DIAGNOSIS — B1009 Other human herpesvirus encephalitis: Secondary | ICD-10-CM | POA: Diagnosis not present

## 2022-04-08 DIAGNOSIS — C3432 Malignant neoplasm of lower lobe, left bronchus or lung: Secondary | ICD-10-CM

## 2022-04-08 DIAGNOSIS — I10 Essential (primary) hypertension: Secondary | ICD-10-CM

## 2022-04-08 DIAGNOSIS — R0981 Nasal congestion: Secondary | ICD-10-CM

## 2022-04-08 DIAGNOSIS — E782 Mixed hyperlipidemia: Secondary | ICD-10-CM | POA: Diagnosis not present

## 2022-04-08 DIAGNOSIS — G8929 Other chronic pain: Secondary | ICD-10-CM | POA: Diagnosis not present

## 2022-04-08 DIAGNOSIS — R519 Headache, unspecified: Secondary | ICD-10-CM

## 2022-04-08 DIAGNOSIS — I70213 Atherosclerosis of native arteries of extremities with intermittent claudication, bilateral legs: Secondary | ICD-10-CM | POA: Diagnosis not present

## 2022-04-08 DIAGNOSIS — Z0001 Encounter for general adult medical examination with abnormal findings: Secondary | ICD-10-CM

## 2022-04-08 DIAGNOSIS — Z789 Other specified health status: Secondary | ICD-10-CM | POA: Diagnosis not present

## 2022-04-08 LAB — LIPID PANEL
Cholesterol: 151 mg/dL (ref 0–200)
HDL: 42.9 mg/dL (ref >39.00)
LDL Cholesterol: 80 mg/dL (ref 0–99)
NonHDL: 107.74
Total CHOL/HDL Ratio: 4
Triglycerides: 138 mg/dL (ref 0.0–149.0)
VLDL: 27.6 mg/dL (ref 0.0–40.0)

## 2022-04-08 LAB — SEDIMENTATION RATE: Sed Rate: 17 mm/hr (ref 0–20)

## 2022-04-08 NOTE — Progress Notes (Unsigned)
Patient ID: Randall Mcguiness., male    DOB: 07-31-1940, 81 y.o.   MRN: [id number]  This visit was conducted in person.  BP 134/66   Pulse 100   Temp 98.4 F (36.9 C) (Temporal)   Ht 5' 6.5" (1.689 m)   Wt 212 lb (96.2 kg)   SpO2 94%   BMI 33.71 kg/m    CC: CPE Subjective:   HPI: Randall Weida. is a 81 y.o. male presenting on 04/08/2022 for Annual Exam [facility] prt 2.  Pt accompanied by son, Jaqua.  )   Saw health advisor 09/2021 for medicare wellness visit. Note reviewed.  No results found.  Flowsheet Row Clinical Support from 09/26/2021 in [place] at [facility]  PHQ-2 Total Score 0          09/26/2021    9:34 AM 03/21/2020    3:38 PM 03/03/2019    9:26 AM 01/24/2019   12:24 PM 04/20/2018    3:48 PM  Fall Risk   Falls in the past year? 0 0 0 0 0  Number falls in past yr: 0 0 0    Injury with Fall? 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 Daughter notes ongoing disorientation/delirium ?unmasking of dementia or other neurologic process. Now fully dependent in IADLs (new). He is not driving. Pending neuropsychological evaluation by Dr Jay Schlichter neuro-oncologist in 2 wks.   Last week I asked [place] for skilled nursing and nurse aide services - they have not provided this yet, daughter states [place] don't have skilled nursing available.   NSCLC stage 4 along with ILD - saw oncology last week, note reviewed. Planned continue holding Keytruda treatment.   Notes ongoing sinus pressure headache, but no significant nasal congestion. Last visit we changed antihistamine, started  flonase and mucinex, daughter reports he's taking this regularly but he doesn't feel this has significantly helped.   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  Flu shot - yearly  [place] 06/2019, 07/2019, no booster Tdap 2016 Pneumovax 2013, prevnar-13 2016 zostavax 2012 shingrix - completed 2019 Advanced directive discussion - has completed this at home. HCPOA would be daughter. Asked to bring [place] a copy.  Seat belt use discussed Sunscreen use discussed. No changing moles on skin. Sees derm q3 mo.  Ex smoker - 80 PY hx, quit 2001  Alcohol - none  Dentist q65mo  Eye exam yearly  Bowel - no constipation Bladder - no incontinence    Single; lives with son and dog  Occ: retired, back part time at [employer]  Activity: gym 4-5d/wk - avid Engineer, maintenance (IT) Diet: good water, fruits/vegetables daily     Relevant past medical, surgical, family and social history reviewed and updated as indicated. Interim medical history since our last visit reviewed. Allergies and medications reviewed and updated. Outpatient Medications Prior to Visit  Medication Sig Dispense Refill   acetaminophen (TYLENOL) 325 MG tablet Take 650 mg by mouth as needed  for moderate pain or mild pain.     albuterol (VENTOLIN HFA) 108 (90 Base) MCG/ACT inhaler Inhale 2 puffs into the lungs every 6 (six) hours as needed for wheezing or shortness of breath. 18 g 2   Ascorbic Acid (VITAMIN C) 1000 MG tablet Take 1 tablet (1,000 mg total) by mouth daily.     aspirin 81 MG EC tablet Take 81 mg by mouth daily.       Calcium-Magnesium-Vitamin D (CALCIUM 1200+D3 PO) Take 1 tablet by mouth daily.     Carboxymethylcellul-Glycerin (LUBRICATING EYE DROPS OP) Place 1 drop into both eyes daily as needed (irritation).     Cholecalciferol  (VITAMIN D3) 50 MCG (2000 UT) TABS Take 2,000 Units by mouth daily. 30 tablet    Cyanocobalamin (B-12) 5000 MCG CAPS Take 5,000 mcg by mouth daily.     docusate sodium (COLACE) 100 MG capsule Take 200 mg by mouth at bedtime.     finasteride (PROSCAR) 5 MG tablet Take 1 tablet (5 mg total) by mouth daily. 30 tablet 6   fluticasone (FLONASE) 50 MCG/ACT nasal spray Place 2 sprays into both nostrils daily.     guaiFENesin (MUCINEX) 600 MG 12 hr tablet Take by mouth 2 (two) times daily.     lidocaine-prilocaine (EMLA) cream Apply 30 -45 mins prior to port access. 30 g 3   loratadine (CLARITIN) 10 MG tablet Take 10 mg by mouth daily.     Melatonin 10 MG CAPS Take 10 mg by mouth at bedtime as needed (sleep).     nitroGLYCERIN (NITROSTAT) 0.4 MG SL tablet Place 1 tablet (0.4 mg total) under the tongue every 5 (five) minutes as needed for chest pain. 25 tablet 3   olopatadine (PATANOL) 0.1 % ophthalmic solution Place 1 drop into both eyes 2 (two) times daily. 5 mL 12   pantoprazole (PROTONIX) 40 MG tablet TAKE 1 TABLET BY MOUTH DAILY (Patient taking differently: Take 40 mg by mouth daily before breakfast.) 90 tablet 0   prochlorperazine (COMPAZINE) 10 MG tablet Take 1 tablet (10 mg total) by mouth every 6 (six) hours as needed for nausea or vomiting. 40 tablet 1   simvastatin (ZOCOR) 40 MG tablet Take 1 tablet (40 mg total) by mouth at bedtime. 90 tablet 3   traMADol (ULTRAM) 50 MG tablet TAKE 1 TABLET BY MOUTH 3 TIMES DAILY AS NEEDED FOR MODERATE PAIN 90 tablet 0   valsartan (DIOVAN) 80 MG tablet Take 0.5 tablets (40 mg total) by mouth daily. One daily     Vitamin E 450 MG (1000 UT) CAPS Take 450 Units by mouth daily.     Zinc 50 MG TABS Take 50 mg by mouth daily.     Facility-Administered Medications Prior to Visit  Medication Dose Route Frequency Provider Last Rate Last Admin   heparin lock flush 100 UNIT/ML injection              Per HPI unless specifically indicated in ROS section below Review  of Systems  Constitutional:  Negative for activity change, appetite change, chills, fatigue, fever and unexpected weight change.  HENT:  Negative for hearing loss.   Eyes:  Negative for visual disturbance.  Respiratory:  Positive for shortness of breath. Negative for cough, chest tightness and wheezing.   Cardiovascular:  Negative for chest pain, palpitations and leg swelling.  Gastrointestinal:  Negative for abdominal distention, abdominal pain, blood in stool, constipation, diarrhea, nausea and vomiting.  Genitourinary:  Negative for difficulty urinating and hematuria.  Musculoskeletal:  Negative for arthralgias, myalgias and neck pain.  Skin:  Negative for rash.  Neurological:  Positive for headaches. Negative for dizziness, seizures and syncope.  Hematological:  Negative for adenopathy. Does not bruise/bleed easily.  Psychiatric/Behavioral:  Negative for dysphoric mood. The patient is not nervous/anxious.     Objective:  BP 134/66   Pulse 100   Temp 98.4 F (36.9 C) (Temporal)   Ht 5' 6.5" (1.689 m)   Wt 212 lb (96.2 kg)   SpO2 94%   BMI 33.71 kg/m   Wt Readings from Last 3 Encounters:  04/08/22 212 lb (96.2 kg)  04/04/22 214 lb (97.1 kg)  03/31/22 211 lb 8 oz (95.9 kg)      Physical Exam Vitals and nursing note reviewed.  Constitutional:      General: He is not in acute distress.    Appearance: Normal appearance. He is well-developed. He is not ill-appearing.  HENT:     Head: Normocephalic and atraumatic.     Right Ear: Hearing, tympanic membrane, ear canal and external ear normal.     Left Ear: Hearing, tympanic membrane, ear canal and external ear normal.     Nose: Nose normal. No congestion or rhinorrhea.     Right Turbinates: Not enlarged, swollen or pale.     Left Turbinates: Not enlarged, swollen or pale.     Right Sinus: No maxillary sinus tenderness or frontal sinus tenderness.     Left Sinus: No maxillary sinus tenderness or frontal sinus tenderness.      Comments: Dry nasal mucosa     Mouth/Throat:     Mouth: Mucous membranes are moist.     Pharynx: Oropharynx is clear. No oropharyngeal exudate or posterior oropharyngeal erythema.  Eyes:     General: No scleral icterus.    Extraocular Movements: Extraocular movements intact.     Conjunctiva/sclera: Conjunctivae normal.     Pupils: Pupils are equal, round, and reactive to light.  Neck:     Thyroid: No thyroid mass or thyromegaly.     Vascular: No carotid bruit.  Cardiovascular:     Rate and Rhythm: Normal rate and regular rhythm.     Pulses: Normal pulses.          Radial pulses are 2+ on the right side and 2+ on the left side.     Heart sounds: Normal heart sounds. No murmur heard. Pulmonary:     Effort: Pulmonary effort is normal. No respiratory distress.     Breath sounds: Normal breath sounds. No wheezing, rhonchi or rales.  Abdominal:     General: Bowel sounds are normal. There is no distension.     Palpations: Abdomen is soft. There is no mass.     Tenderness: There is no abdominal tenderness. There is no guarding or rebound.     Hernia: No hernia is present.  Musculoskeletal:        General: Normal range of motion.     Cervical back: Normal range of motion and neck supple.     Right lower leg: No edema.     Left lower leg: No edema.  Lymphadenopathy:     Cervical: No cervical adenopathy.  Skin:    General: Skin is warm and dry.     Findings: No rash.  Neurological:     General: No focal deficit present.     Mental Status: He is alert and oriented to person, place, and time.  Psychiatric:  Mood and Affect: Mood normal.        Behavior: Behavior normal.        Thought Content: Thought content normal.        Judgment: Judgment normal.       Results for orders placed or performed in visit on 04/08/22  Lipid panel  Result Value Ref Range   Cholesterol 151 0 - 200 mg/dL   Triglycerides 138.0 0.0 - 149.0 mg/dL   HDL 42.90 >39.00 mg/dL   VLDL 27.6 0.0 - 40.0  mg/dL   LDL Cholesterol 80 0 - 99 mg/dL   Total CHOL/HDL Ratio 4    NonHDL 107.74   Sedimentation rate  Result Value Ref Range   Sed Rate 17 0 - 20 mm/hr   *Note: Due to a large number of results and/or encounters for the requested time period, some results have not been displayed. A complete set of results can be found in Results Review.   Lab Results  Component Value Date   CREATININE 0.90 04/04/2022   BUN 14 04/04/2022   NA 139 04/04/2022   K 4.0 04/04/2022   CL 108 04/04/2022   CO2 24 04/04/2022    Lab Results  Component Value Date   WBC 7.1 04/04/2022   HGB 14.2 04/04/2022   HCT 41.3 04/04/2022   MCV 93.0 04/04/2022   PLT 210 04/04/2022    Lab Results  Component Value Date   TSH 0.474 02/11/2022    Assessment & Plan:   Problem List Items Addressed This Visit     Encounter for general adult medical examination with abnormal findings - Primary (Chronic)    Preventative protocols reviewed and updated unless pt declined. Discussed healthy diet and lifestyle.       Advanced care planning/counseling discussion (Chronic)    Advanced directive discussion - has completed this at home. HCPOA would be daughter. Asked to bring [place] a copy.       Atherosclerosis of native arteries of extremity with intermittent claudication (HCC)    Followed by [facility]      Hyperlipidemia    Chronic, stable on simvastatin - continue. The ASCVD Risk score (Arnett DK, et al., 2019) failed to calculate for the following reasons:   The 2019 ASCVD risk score is only valid for ages 29 to 52   The patient has a prior MI or stroke diagnosis       Essential hypertension    Chronic, stable on current regimen including lowe valsartan dose 1/2 tab.       Nasal sinus congestion    Chronic issue - ongoing despite regular allegra, mucinex, flonase. See below.       Chronic headache    H/o this, again worsening. Not responding to treatment of sinus headache. ?post-encephalitis headache - rec trial  scheduled tylenol for 5 days and update with effect.       Relevant Orders   Sedimentation rate (Completed)   Cancer of lower lobe of left lung [facility])    Recently saw onc, appreciate their care. Currently off treatment.       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  Pending neuro oncology evaluation for ongoing impaired mentation.       ILD (interstitial lung disease) ([place])    Completed prednisone taper.       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  Orders Placed This Encounter  Procedures   Flu Vaccine QUAD High Dose(Fluad)   Sedimentation rate     Patient Instructions  Flu shot today  Bring [place] a copy of your advanced directive.  For sinus symptoms - continue daily flonase, allegra and mucinex.  As [place] doesn't offer nursing we will switch home health agencies to [facility].  I'm glad you're seeing Dr Mickeal Skinner for memory evaluation.  For headache, possibly post-encephalitis pain. May take tylenol 500mg  twice daily with breakfast and lunch for the next 5 days. Update if not helping.   Follow up plan: Return if symptoms worsen or fail to improve.  Ria Bush, MD

## 2022-04-08 NOTE — Assessment & Plan Note (Signed)
Preventative protocols reviewed and updated unless pt declined. Discussed healthy diet and lifestyle.  

## 2022-04-08 NOTE — Patient Instructions (Addendum)
Flu shot today  Bring [place] a copy of your advanced directive.  For sinus symptoms - continue daily flonase, allegra and mucinex.  As [place] doesn't offer nursing we will switch home health agencies to [facility].  I'm glad you're seeing Dr Mickeal Skinner for memory evaluation.  For headache, possibly post-encephalitis pain. May take tylenol 500mg  twice daily with breakfast and lunch for the next 5 days. Update if not helping.

## 2022-04-08 NOTE — Assessment & Plan Note (Signed)
Advanced directive discussion - has completed this at home. HCPOA would be daughter. Asked to bring [place] a copy.

## 2022-04-09 ENCOUNTER — Telehealth: Payer: Self-pay | Admitting: Family Medicine

## 2022-04-09 DIAGNOSIS — I2510 Atherosclerotic heart disease of native coronary artery without angina pectoris: Secondary | ICD-10-CM | POA: Diagnosis not present

## 2022-04-09 DIAGNOSIS — C3432 Malignant neoplasm of lower lobe, left bronchus or lung: Secondary | ICD-10-CM | POA: Diagnosis not present

## 2022-04-09 DIAGNOSIS — J849 Interstitial pulmonary disease, unspecified: Secondary | ICD-10-CM | POA: Diagnosis not present

## 2022-04-09 DIAGNOSIS — I5030 Unspecified diastolic (congestive) heart failure: Secondary | ICD-10-CM | POA: Diagnosis not present

## 2022-04-09 DIAGNOSIS — Z9989 Dependence on other enabling machines and devices: Secondary | ICD-10-CM | POA: Diagnosis not present

## 2022-04-09 DIAGNOSIS — I110 Hypertensive heart disease with heart failure: Secondary | ICD-10-CM | POA: Diagnosis not present

## 2022-04-09 DIAGNOSIS — I700 Atherosclerosis of aorta: Secondary | ICD-10-CM | POA: Diagnosis not present

## 2022-04-09 DIAGNOSIS — J019 Acute sinusitis, unspecified: Secondary | ICD-10-CM | POA: Diagnosis not present

## 2022-04-09 DIAGNOSIS — R0902 Hypoxemia: Secondary | ICD-10-CM | POA: Diagnosis not present

## 2022-04-09 DIAGNOSIS — G4733 Obstructive sleep apnea (adult) (pediatric): Secondary | ICD-10-CM | POA: Diagnosis not present

## 2022-04-09 NOTE — Telephone Encounter (Signed)
DR. Danise Mina is aware of patient changing [place] agencies.

## 2022-04-09 NOTE — Telephone Encounter (Signed)
Can we get more details?  I saw him yesterday and weight was stable.  What parameters are they referring to? I think plan was to switch [facility] agencies from [place] to [employer] this week.

## 2022-04-09 NOTE — Telephone Encounter (Signed)
Spoke to [place] and was advised that their parameter  for weight is between 201 - 211. Patient's weight this morning was 215 lb. [name] stated that she  just found out that he is switching to another company. [name] no need to respond to this since patient is changing [facility] agencies this week.

## 2022-04-09 NOTE — Telephone Encounter (Signed)
[name] from [place] wants to know if Dr, Darnell Level can change the parameters cause his weight is going up. Call back number [phone] Fax number [phone]

## 2022-04-10 NOTE — Assessment & Plan Note (Signed)
Followed by [facility]

## 2022-04-10 NOTE — Assessment & Plan Note (Signed)
Needs SN and nurse aide - will see about changing [place] agencies to one that offers these services in addition to PT

## 2022-04-10 NOTE — Assessment & Plan Note (Addendum)
H/o this, again worsening. Not responding to treatment of sinus headache. ?post-encephalitis headache - rec trial scheduled tylenol for 5 days and update with effect.

## 2022-04-10 NOTE — Assessment & Plan Note (Signed)
Chronic issue - ongoing despite regular allegra, mucinex, flonase. See below.

## 2022-04-10 NOTE — Assessment & Plan Note (Signed)
Chronic, stable on simvastatin - continue. The ASCVD Risk score (Arnett DK, et al., 2019) failed to calculate for the following reasons:   The 2019 ASCVD risk score is only valid for ages 47 to 82   The patient has a prior MI or stroke diagnosis

## 2022-04-10 NOTE — Assessment & Plan Note (Addendum)
Chronic, stable on current regimen including lowe valsartan dose 1/2 tab.

## 2022-04-10 NOTE — Assessment & Plan Note (Signed)
Recently saw onc, appreciate their care. Currently off treatment.

## 2022-04-10 NOTE — Assessment & Plan Note (Signed)
Completed prednisone taper.

## 2022-04-10 NOTE — Assessment & Plan Note (Signed)
Completed treatment course with valtrex and vimpat for seizure prevention.  Pending neuro oncology evaluation for ongoing impaired mentation.

## 2022-04-14 ENCOUNTER — Telehealth: Payer: Self-pay | Admitting: Family Medicine

## 2022-04-14 ENCOUNTER — Encounter: Payer: Self-pay | Admitting: Internal Medicine

## 2022-04-14 ENCOUNTER — Ambulatory Visit
Admission: RE | Admit: 2022-04-14 | Discharge: 2022-04-14 | Disposition: A | Discharge status: Home / Self Care | Payer: Self-pay | Source: Ambulatory Visit | Attending: Internal Medicine | Admitting: Internal Medicine

## 2022-04-14 ENCOUNTER — Other Ambulatory Visit: Payer: Self-pay | Admitting: *Deleted

## 2022-04-14 DIAGNOSIS — B1009 Other human herpesvirus encephalitis: Secondary | ICD-10-CM

## 2022-04-14 NOTE — Telephone Encounter (Signed)
I spoke with [name] this morning.   [facility] d/c the patient on 04/10/2022.  I have sent a message to [facility] this morning asking them to start services for this patient.   [facility] was waiting on [facility] to d/c patient before they could accept him.    Please follow the referral to noted updates.

## 2022-04-14 NOTE — Telephone Encounter (Signed)
Patient daughter called and stated that Dr. Darnell Level suppose to cancel [facility] visit and go to [facility] and stated they have not got any phone calls yet. Call back number [phone].

## 2022-04-14 NOTE — Telephone Encounter (Signed)
Patient' daughter notified of information regarding referral to [facility].[name] Patient's daughter was advised that she should hear something from [facility] this week.

## 2022-04-16 ENCOUNTER — Telehealth: Payer: Self-pay | Admitting: Family Medicine

## 2022-04-16 DIAGNOSIS — Z791 Long term (current) use of non-steroidal anti-inflammatories (NSAID): Secondary | ICD-10-CM | POA: Diagnosis not present

## 2022-04-16 DIAGNOSIS — I6523 Occlusion and stenosis of bilateral carotid arteries: Secondary | ICD-10-CM | POA: Diagnosis not present

## 2022-04-16 DIAGNOSIS — G8929 Other chronic pain: Secondary | ICD-10-CM | POA: Diagnosis not present

## 2022-04-16 DIAGNOSIS — B1009 Other human herpesvirus encephalitis: Secondary | ICD-10-CM | POA: Diagnosis not present

## 2022-04-16 DIAGNOSIS — N400 Enlarged prostate without lower urinary tract symptoms: Secondary | ICD-10-CM | POA: Diagnosis not present

## 2022-04-16 DIAGNOSIS — I10 Essential (primary) hypertension: Secondary | ICD-10-CM | POA: Diagnosis not present

## 2022-04-16 DIAGNOSIS — Z9181 History of falling: Secondary | ICD-10-CM | POA: Diagnosis not present

## 2022-04-16 DIAGNOSIS — Z79899 Other long term (current) drug therapy: Secondary | ICD-10-CM | POA: Diagnosis not present

## 2022-04-16 DIAGNOSIS — J849 Interstitial pulmonary disease, unspecified: Secondary | ICD-10-CM | POA: Diagnosis not present

## 2022-04-16 DIAGNOSIS — N529 Male erectile dysfunction, unspecified: Secondary | ICD-10-CM | POA: Diagnosis not present

## 2022-04-16 DIAGNOSIS — H9190 Unspecified hearing loss, unspecified ear: Secondary | ICD-10-CM | POA: Diagnosis not present

## 2022-04-16 DIAGNOSIS — R4182 Altered mental status, unspecified: Secondary | ICD-10-CM | POA: Diagnosis not present

## 2022-04-16 DIAGNOSIS — G929 Unspecified toxic encephalopathy: Secondary | ICD-10-CM | POA: Diagnosis not present

## 2022-04-16 DIAGNOSIS — I70229 Atherosclerosis of native arteries of extremities with rest pain, unspecified extremity: Secondary | ICD-10-CM | POA: Diagnosis not present

## 2022-04-16 DIAGNOSIS — R0981 Nasal congestion: Secondary | ICD-10-CM | POA: Diagnosis not present

## 2022-04-16 DIAGNOSIS — C3432 Malignant neoplasm of lower lobe, left bronchus or lung: Secondary | ICD-10-CM | POA: Diagnosis not present

## 2022-04-16 DIAGNOSIS — Z7982 Long term (current) use of aspirin: Secondary | ICD-10-CM | POA: Diagnosis not present

## 2022-04-16 NOTE — Telephone Encounter (Signed)
Patient's daughter [name] notified as instructed by telephone and verbalized understanding.

## 2022-04-16 NOTE — Telephone Encounter (Signed)
Randall Ali from [place] called and was returning a call. Call back number [phone]

## 2022-04-16 NOTE — Telephone Encounter (Signed)
Agree with this. Thanks.  

## 2022-04-16 NOTE — Telephone Encounter (Signed)
Randall Ali from [place] [facility] called in and stated that they are starting nursing services today. She was wanting a verbal order to do a PT evaluation. Thank you!

## 2022-04-16 NOTE — Telephone Encounter (Signed)
Anelisa notified regarding verbal orders. Anelisa stated they do have a home health aid that will be going out to help the patient. Anelisa stated that the patient's blood pressure was up today 152/80 and 161/60. Anelisa stated that she felt that patient was a little anxious because he kept getting up and leaving the room to talk to his daughter. Anelissa stated that they will continue to monitor his blood pressure. Anelisa stated that their perimeters for BP is 140/90 and have to call if over that. Anelisa stated that they can change the perimeters at the providers request.  Aubery Lapping stated when calling back if she is not available able to leave message on her secure voicemail.

## 2022-04-16 NOTE — Telephone Encounter (Signed)
Noted. Recommend family check BP daily for 3 days and call us with readings. Try to check in resting state - wait 20 min after meal, drink, exercise, pain to check BP.  BP Readings from Last 3 Encounters:  04/08/22 134/66  04/04/22 108/61  03/31/22 100/60

## 2022-04-16 NOTE — Telephone Encounter (Signed)
Left message on voicemail for Anelisa to call the office back.

## 2022-04-17 ENCOUNTER — Other Ambulatory Visit: Payer: Self-pay | Admitting: *Deleted

## 2022-04-17 ENCOUNTER — Ambulatory Visit
Admission: RE | Admit: 2022-04-17 | Discharge: 2022-04-17 | Disposition: A | Discharge status: Home / Self Care | Payer: Self-pay | Source: Ambulatory Visit | Attending: Internal Medicine | Admitting: Internal Medicine

## 2022-04-17 DIAGNOSIS — B1009 Other human herpesvirus encephalitis: Secondary | ICD-10-CM

## 2022-04-18 ENCOUNTER — Encounter: Payer: Self-pay | Admitting: Internal Medicine

## 2022-04-18 ENCOUNTER — Inpatient Hospital Stay: Payer: PPO | Attending: Internal Medicine | Admitting: Internal Medicine

## 2022-04-18 VITALS — BP 110/71 | HR 90 | Temp 98.8°F | Resp 16 | Wt 211.0 lb

## 2022-04-18 DIAGNOSIS — R4189 Other symptoms and signs involving cognitive functions and awareness: Secondary | ICD-10-CM | POA: Diagnosis not present

## 2022-04-18 DIAGNOSIS — Z87891 Personal history of nicotine dependence: Secondary | ICD-10-CM | POA: Diagnosis not present

## 2022-04-18 DIAGNOSIS — Z7982 Long term (current) use of aspirin: Secondary | ICD-10-CM | POA: Insufficient documentation

## 2022-04-18 DIAGNOSIS — I10 Essential (primary) hypertension: Secondary | ICD-10-CM | POA: Insufficient documentation

## 2022-04-18 DIAGNOSIS — B004 Herpesviral encephalitis: Secondary | ICD-10-CM | POA: Insufficient documentation

## 2022-04-18 DIAGNOSIS — Z85820 Personal history of malignant melanoma of skin: Secondary | ICD-10-CM | POA: Diagnosis not present

## 2022-04-18 DIAGNOSIS — B1009 Other human herpesvirus encephalitis: Secondary | ICD-10-CM

## 2022-04-18 DIAGNOSIS — R29818 Other symptoms and signs involving the nervous system: Secondary | ICD-10-CM | POA: Insufficient documentation

## 2022-04-18 DIAGNOSIS — C3432 Malignant neoplasm of lower lobe, left bronchus or lung: Secondary | ICD-10-CM | POA: Diagnosis not present

## 2022-04-18 DIAGNOSIS — Z79899 Other long term (current) drug therapy: Secondary | ICD-10-CM | POA: Insufficient documentation

## 2022-04-18 MED ORDER — PREDNISONE 50 MG PO TABS: 50.0000 mg | ORAL_TABLET | Freq: Every day | ORAL | 0 refills | Status: DC

## 2022-04-18 NOTE — Progress Notes (Signed)
[facility] [address] [place], [place] [address] [phone]   New Patient Evaluation  Date of Service: 04/18/22 Patient Name: Randall Ali. Patient MRN: [id number] Patient DOB: [birth year] Provider: Ventura Sellers, MD  Identifying Statement:  Jovian Lembcke. is a 81 y.o. 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    Referring Provider: Ria Bush, [place],   [id number]  Primary Cancer:  Oncologic History: Oncology History Overview Note  #MAY 2022-Lung cancer-non-small cell [CT guided bx] T3N1 vs stage IV Dr.Hendrickson.  MRI brain negative for malignancy.# 1. April 2022- LLL ~4.0 cm mass in the superior segment left lower lobe abuts the major fissure and the posterior pleural surface without visible chest wall invasion without pleural effusion. There 2-3 other small nodules in the left lower lobe, largest measures 9 mm in diameter. These are suspicious for same lobe satellite lesions and there is left hilar adenopathy. Assuming non-small cell lung cancer the appearance is compatible with T3 N1 M0 disease (stage IIIA).  # right suprarenal nodule-awaiting biopsy on 5/31- non-small cell [revived at tumor conference at [facility]  # June 9th 2022- CARBO-TAXOl-KEYTRUDA; Fulphila  # ? Subtle adrenal insufficiency- No acute process - -September MRI brain negative for any pituitary hypophysitis/brain metastasis. Prednisone   MOLECULAR TESTING: NGS TPS-PDL 100%; EXON 12 amplification* mutations.  # CAD [CABG 2016; Dr.Gollan]   Cancer of lower lobe of left lung ([facility])  11/02/2020 Initial Diagnosis   Cancer of lower lobe of left lung ([facility])   11/02/2020 Cancer Staging   Staging form: Lung, AJCC 8th Edition - Clinical: Stage IVA (cT3, cN1, cM1a) -  Signed by Cammie Sickle, MD on 11/02/2020   11/16/2020 - 01/02/2022 Chemotherapy   Patient is on Treatment Plan : LUNG NSCLC Carboplatin + Paclitaxel + Pembrolizumab q21d x 4 cycles / Pembrolizumab Maintenance Q21D     11/16/2020 -  Chemotherapy   Patient is on Treatment Plan : LUNG NSCLC Carboplatin (6) + Paclitaxel (200) + Pembrolizumab (200) D1 q21d x 4 cycles / Pembrolizumab (200) Maintenance D1 q21d       History of Present Illness: [name] records from the referring physician were obtained and reviewed and the patient interviewed to confirm this HPI.  [name] Presents for evaluation today following recent admission for herpes encephalitis.  He was admitted to [facility] on 02/20/22 for progressive confusion, disorganized thinking, memory impairment.  He was monitored and treated with EEG and valacyclovir x14 days.  Per daughter, confusion and memory impairment improved modestly during the admission.  Currently, he still cannot remember most conversations.  He dresses, feeds, toilets himself, but does "forget what he is doing when standing in the shower".  Daughter is now living with him to help with ADLs, prior to this he was completely independent.  Also complains of headaches "in my temples" every day since discharge.  He has been dosing tylenol 500mg  twice per day for ~2 months.  Intermittently non-compliant with CPAP during that time.  Medications: Current Outpatient Medications on File Prior to Visit  Medication Sig Dispense Refill   acetaminophen (TYLENOL) 325 MG tablet Take 650 mg by mouth as needed for moderate pain or mild pain.     albuterol (VENTOLIN HFA) 108 (90 Base) MCG/ACT inhaler Inhale 2 puffs  into the lungs every 6 (six) hours as needed for wheezing or shortness of breath. 18 g 2   Ascorbic Acid (VITAMIN C) 1000 MG tablet Take 1 tablet (1,000 mg total) by mouth daily.     aspirin 81 MG EC tablet Take 81 mg by mouth daily.       Calcium-Magnesium-Vitamin D  (CALCIUM 1200+D3 PO) Take 1 tablet by mouth daily.     cetirizine (ZYRTEC) 10 MG chewable tablet Chew 10 mg by mouth daily.     Cholecalciferol (VITAMIN D3) 50 MCG (2000 UT) TABS Take 2,000 Units by mouth daily. 30 tablet    Cyanocobalamin (B-12) 5000 MCG CAPS Take 5,000 mcg by mouth daily.     docusate sodium (COLACE) 100 MG capsule Take 200 mg by mouth at bedtime.     finasteride (PROSCAR) 5 MG tablet Take 1 tablet (5 mg total) by mouth daily. 30 tablet 6   fluticasone (FLONASE) 50 MCG/ACT nasal spray Place 2 sprays into both nostrils daily.     guaiFENesin (MUCINEX) 600 MG 12 hr tablet Take by mouth 2 (two) times daily.     lidocaine-prilocaine (EMLA) cream Apply 30 -45 mins prior to port access. 30 g 3   Melatonin 10 MG CAPS Take 10 mg by mouth at bedtime as needed (sleep).     nitroGLYCERIN (NITROSTAT) 0.4 MG SL tablet Place 1 tablet (0.4 mg total) under the tongue every 5 (five) minutes as needed for chest pain. 25 tablet 3   pantoprazole (PROTONIX) 40 MG tablet TAKE 1 TABLET BY MOUTH DAILY (Patient taking differently: Take 40 mg by mouth daily before breakfast.) 90 tablet 0   simvastatin (ZOCOR) 40 MG tablet Take 1 tablet (40 mg total) by mouth at bedtime. 90 tablet 3   valsartan (DIOVAN) 80 MG tablet Take 0.5 tablets (40 mg total) by mouth daily. One daily     Vitamin E 450 MG (1000 UT) CAPS Take 450 Units by mouth daily.     Zinc 50 MG TABS Take 50 mg by mouth daily.     Carboxymethylcellul-Glycerin (LUBRICATING EYE DROPS OP) Place 1 drop into both eyes daily as needed (irritation). (Patient not taking: Reported on 04/18/2022)     olopatadine (PATANOL) 0.1 % ophthalmic solution Place 1 drop into both eyes 2 (two) times daily. (Patient not taking: Reported on 04/18/2022) 5 mL 12   prochlorperazine (COMPAZINE) 10 MG tablet Take 1 tablet (10 mg total) by mouth every 6 (six) hours as needed for nausea or vomiting. (Patient not taking: Reported on 04/18/2022) 40 tablet 1   traMADol (ULTRAM) 50  MG tablet TAKE 1 TABLET BY MOUTH 3 TIMES DAILY AS NEEDED FOR MODERATE PAIN (Patient not taking: Reported on 04/18/2022) 90 tablet 0   Current Facility-Administered Medications on File Prior to Visit  Medication Dose Route Frequency Provider Last Rate Last Admin   heparin lock flush 100 UNIT/ML injection             Allergies: No Known Allergies Past Medical History:  Past Medical History:  Diagnosis Date   Allergy    seasonal   Arthritis    all over- in general    CAD (coronary artery disease)    a. inferior wall MI 10/01 s/p PCI/DES to RCA; b. Myoview 3/16 neg for ischemia; c. LHC 8/16: ostLAD 80%, OM1 70%, OM2 70% x 2 lesions, mRCA 30%, dRCA 70% s/p 4-V CABG 01/24/15 (LIMA-LAD, VG- OM1, VG-OM2, VG-PDA)    Cancer (HCC)    skin, melanoma  Carotid artery disease ([facility])    a. [place] 8/16: 1-39% bilateral ICA stenosis   Cataract    removed   Diastolic dysfunction    a. TTE 8/16: EF 55-60%, no RWMA, Gr1DD, calcified mitral annulus, mild biatrial enlargement   Erectile dysfunction    GERD (gastroesophageal reflux disease)    History of elbow surgery    History of hiatal hernia    HLD (hyperlipidemia)    HTN (hypertension)    Inferior myocardial infarction ([facility]) 03/2000   stent RCA   Lung cancer ([facility])    Postoperative wound infection 02/02/2015   Reflux esophagitis    Sleep apnea 2017   CPAP at night   Past Surgical History:  Past Surgical History:  Procedure Laterality Date   arm surgery  2010   BROW LIFT Bilateral 11/25/2019   Procedure: BROW PTOSIS REPAIR BILATERAL;  Surgeon: Karle Starch, MD;  Location: [facility];  Service: Ophthalmology;  Laterality: Bilateral;  sleep apnea   CARDIAC CATHETERIZATION  06/24/2011   CARDIAC CATHETERIZATION N/A 01/18/2015   Procedure: Left Heart Cath with coronary angiography;  Surgeon: Minna Merritts, MD;  Location: [facility];  Service: Cardiovascular;  Laterality: N/A;   CARDIAC CATHETERIZATION N/A 01/18/2015    Procedure: Intravascular Pressure Wire/FFR Study;  Surgeon: [name], MD;  Location: [facility];  Service: Cardiovascular;  Laterality: N/A;   CAROTID STENT  03/10/2011   COLONOSCOPY  2010   COLONOSCOPY  06/14/2014   Dr Hilarie Fredrickson   COLONOSCOPY  01/2022   done for colon thickening at splenic flexure - 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[name])   CORONARY ARTERY BYPASS GRAFT N/A 01/24/2015   Procedure: CORONARY ARTERY BYPASS GRAFTING x 4 (LIMA-LAD, SVG-Int 1- Int 2, SVG-PD) ENDOSCOPIC GREATER SAPHENOUS VEIN HARVEST LEFT LEG;  Surgeon: [name], MD;  Location: [facility];  Service: Open Heart Surgery;  Laterality: N/A;   EMBOLECTOMY  06/15/2019   Procedure: EMBOLECTOMY;  Surgeon: Katha Cabal, MD;  Location: [facility];  Service: Vascular;;  right superficial femoral artery   ENDARTERECTOMY FEMORAL Right 06/15/2019   Procedure: ENDARTERECTOMY FEMORAL;  Surgeon: Katha Cabal, MD;  Location: [facility];  Service: Vascular;  Laterality: Right;  common femoral profunda femoris superficial femoral   ESOPHAGOGASTRODUODENOSCOPY (EGD) WITH PROPOFOL N/A 04/24/2016   Procedure: ESOPHAGOGASTRODUODENOSCOPY (EGD) WITH PROPOFOL;  Surgeon: Jerene Bears, MD;  Location: [facility];  Service: Gastroenterology;  Laterality: N/A;   EYE SURGERY     lasik 15 yrs. ago, cataracts removed - both eyes    HAMMER TOE SURGERY     right toe   INSERTION OF ILIAC STENT Right 06/15/2019   Procedure: INSERTION OF ILIAC STENT ( STENTING OF SFA/POP ARTERY );  Surgeon: Katha Cabal, MD;  Location: [facility];  Service: Vascular;  Laterality: Right;  angioplpasty and stent placement: right superficial femoral right tibiopopliteal trunk bilateral common iliac arteries   IR IMAGING GUIDED PORT INSERTION  11/09/2020   LEFT HEART CATH AND CORONARY ANGIOGRAPHY Left 06/10/2017   Procedure: LEFT HEART CATH AND CORONARY ANGIOGRAPHY;  Surgeon: Minna Merritts, MD;  Location: [facility];  Service: Cardiovascular;  Laterality: Left;   LOWER EXTREMITY ANGIOGRAPHY Left 01/04/2019   Procedure: LOWER EXTREMITY ANGIOGRAPHY;  Surgeon: Katha Cabal, MD;  Location: [facility];  Service: Cardiovascular;  Laterality: Left;   LOWER EXTREMITY ANGIOGRAPHY Right 01/25/2019   Procedure: LOWER EXTREMITY ANGIOGRAPHY;  Surgeon: Katha Cabal, MD;  Location:  [facility];  Service: Cardiovascular;  Laterality: Right;   LOWER EXTREMITY ANGIOGRAPHY Right 01/15/2021   LOWER EXTREMITY ANGIOGRAPHY and stent placement to R SFA and popliteal artery Delana Meyer, Dolores Lory, MD)   NASAL SINUS SURGERY  2008   septpolasty, bilateral turbinate reduction   SHOULDER ARTHROSCOPY  2012   TEE WITHOUT CARDIOVERSION N/A 01/24/2015   Procedure: TRANSESOPHAGEAL ECHOCARDIOGRAM (TEE);  Surgeon: [name], MD;  Location: [facility];  Service: Open Heart Surgery;  Laterality: N/A;   TOE SURGERY  1994   UPPER GI ENDOSCOPY  07/2014, 04-24-16   Dr Raquel James   WRIST SURGERY  2011   Social History:  Social History   Socioeconomic History   Marital status: Single    Spouse name: Not on file   Number of children: Not on file   Years of education: 12   Highest education level: Not on file  Occupational History   Occupation: retired    Comment: [employer]  Tobacco Use   Smoking status: Former    Packs/day: 2.50    Years: 40.00    Total pack years: 100.00    Types: Cigarettes    Quit date: 03/24/2000    Years since quitting: 22.0   Smokeless tobacco: Never  Vaping Use   Vaping Use: Never used  Substance and Sexual Activity   Alcohol use: Yes    Comment: occassionally   Drug use: No   Sexual activity: Yes  Other Topics Concern   Not on file  Social History Narrative   Singled; lives with son and dog    Occ: retired, back part time at [employer];    Activity: gym 4-5d/wk   Diet: good water, fruits/vegetables daily   Caffeine Use-yes      H/o Armed forces logistics/support/administrative officer    ------------------------------------       Leisure centre manager- retd; [employer] store- retd; quit smoking 2001. Alcohol couple nights a week. Live in [place]. Daughter lives 10 mins.    Social Determinants of Health   Financial Resource Strain: Low Risk  (09/26/2021)   Overall Financial Resource Strain (CARDIA)    Difficulty of Paying Living Expenses: Not hard at all  Food Insecurity: No Food Insecurity (02/14/2022)   Hunger Vital Sign    Worried About Running Out of Food in the Last Year: Never true    Ran Out of Food in the Last Year: Never true  Transportation Needs: No Transportation Needs (02/14/2022)   PRAPARE - Hydrologist (Medical): No    Lack of Transportation (Non-Medical): No  Physical Activity: Sufficiently Active (09/26/2021)   Exercise Vital Sign    Days of Exercise per Week: 5 days    Minutes of Exercise per Session: 30 min  Stress: No Stress Concern Present (09/26/2021)   [place]    Feeling of Stress : Only a little  Social Connections: Socially Isolated (09/26/2021)   Social Connection and Isolation Panel [NHANES]    Frequency of Communication with Friends and Family: Twice a week    Frequency of Social Gatherings with Friends and Family: Twice a week    Attends Religious Services: Never    Marine scientist or Organizations: Not on file    Attends Archivist Meetings: Never    Marital Status: Never married  Intimate Partner Violence: Not At Risk (02/14/2022)   Humiliation, Afraid, Rape, and Kick questionnaire    Fear of Current or Ex-Partner:  No    Emotionally Abused: No    Physically Abused: No    Sexually Abused: No   Family History:  Family History  Problem Relation Age of Onset   Hypertension Mother    Heart disease Mother    Hypertension Father    Diabetes Father    Lymphoma Sister    Heart disease Brother 5   Cancer Paternal Grandfather    Colon cancer  Neg Hx    Prostate cancer Neg Hx    Bladder Cancer Neg Hx    Kidney cancer Neg Hx    Colon polyps Neg Hx    Esophageal cancer Neg Hx    Pancreatic cancer Neg Hx    Stomach cancer Neg Hx     Review of Systems: Constitutional: Doesn't report fevers, chills or abnormal weight loss Eyes: Doesn't report blurriness of vision Ears, nose, mouth, throat, and face: Doesn't report sore throat Respiratory: Doesn't report cough, dyspnea or wheezes Cardiovascular: Doesn't report palpitation, chest discomfort  Gastrointestinal:  Doesn't report nausea, constipation, diarrhea GU: Doesn't report incontinence Skin: Doesn't report skin rashes Neurological: Per HPI Musculoskeletal: Doesn't report joint pain Behavioral/Psych: Doesn't report anxiety  Physical Exam: Vitals:   04/18/22 1110  BP: 110/71  Pulse: 90  Resp: 16  Temp: 98.8 F (37.1 C)   KPS: 60. General: Alert, cooperative, pleasant, in no acute distress Head: Normal EENT: No conjunctival injection or scleral icterus.  Lungs: Resp effort normal Cardiac: Regular rate Abdomen: Non-distended abdomen Skin: No rashes cyanosis or petechiae. Extremities: No clubbing or edema  Neurologic Exam: Mental Status: Awake, alert, attentive to examiner. Oriented to self and environment. Language is fluent with intact comprehension.  Age advanced psychomotor slowing, marked anterograde amnesia.  Cranial Nerves: Visual acuity is grossly normal. Visual fields are full. Extra-ocular movements intact. No ptosis. Face is symmetric Motor: Tone and bulk are normal. Power is full in both arms and legs. Reflexes are symmetric, no pathologic reflexes present.  Sensory: Intact to light touch Gait: Normal.   Labs: I have reviewed the data as listed    Component Value Date/Time   NA 139 04/04/2022 0852   NA 138 01/15/2015 0956   K 4.0 04/04/2022 0852   CL 108 04/04/2022 0852   CO2 24 04/04/2022 0852   GLUCOSE 103 (H) 04/04/2022 0852   BUN 14  04/04/2022 0852   BUN 16 01/15/2015 0956   CREATININE 0.90 04/04/2022 0852   CALCIUM 9.2 04/04/2022 0852   PROT 7.1 04/04/2022 0852   ALBUMIN 4.1 04/04/2022 0852   AST 18 04/04/2022 0852   ALT 14 04/04/2022 0852   ALKPHOS 57 04/04/2022 0852   BILITOT 0.6 04/04/2022 0852   GFRNONAA >60 04/04/2022 0852   GFRAA >60 06/16/2019 1126   Lab Results  Component Value Date   WBC 7.1 04/04/2022   NEUTROABS 4.7 04/04/2022   HGB 14.2 04/04/2022   HCT 41.3 04/04/2022   MCV 93.0 04/04/2022   PLT 210 04/04/2022    Imaging: [facility] MRI Brain W Wo Contrast  Result Date: 02/22/2022 EXAM: Magnetic resonance imaging, brain, without and with contrast material. DATE: 02/22/2022 10:11 AM ACCESSION: [id number] UN DICTATED: 02/22/2022 12:37 PM INTERPRETATION LOCATION: [facility] CLINICAL INDICATION: 81 years old Male with Nonsmall cell lung cancer. Evaluate for leptomeningeal disease COMPARISON: CT head 02/20/2022 TECHNIQUE: Multiplanar, multisequence MR imaging of the brain was performed without and with I.V. contrast. FINDINGS: There are scattered foci of signal abnormality within the periventricular and deep white matter. These are nonspecific but  commonly seen with small vessel ischemic changes. On T2-weighted and FLAIR images, there is abnormal signal involving the posterior portions of the left medial temporal lobe, including the hippocampus, with extension into the occipital lobe. Postcontrast images demonstrate curvilinear enhancement which appears to track along the collateral sulcus, although evaluation of this finding is somewhat limited given significant motion on several of the postcontrast sequences. Ventricles are normal in size. There is no midline shift. No extra-axial fluid collection. No evidence of intracranial hemorrhage. No diffusion weighted signal abnormality to suggest acute infarct. No mass.   --Abnormal enhancement which appears to track along the left collateral sulcus with surrounding  parenchymal edema involving the left medial temporal lobe and adjacent occipital lobe. These findings could reflect leptomeningeal metastatic disease, although the signal abnormality involving the hippocampus could alternatively be due to seizures or an infectious or autoimmune/paraneoplastic process. Recommend clinical correlation and evaluation with CSF sampling.   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 Presents with clinical and radiographic syndrome consistent with HSV2 encephalitis, confirmed by CSF PCR.  He was treated with full course of valtrex, Vimpat was stopped due to lack of seizures.    Since discharge, he remains with static encephalopathy, worse from prior baseline.  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  He is not declining at this time.  Role of cancer and immunotherapy with regards to his encephalitis is unclear.  Headache has been persistent, tension type at this time.  Typically would have expected resolution at this point. No meningisumus clinically.  Analgesia overuse with standing Tylenol BID could be contributing at this stage to rebound headache.  We recommended a short course of prednisone 50mg  daily x7 days, to help with headache and allow for gradual withdrawal of analgesia.  We would like to obtain an additional MRI brain for review given ongoing cognitive deficits, headaches.     We spent twenty additional minutes teaching regarding the natural history, biology, and historical experience in the treatment of neurologic complications of cancer.   We appreciate the opportunity to participate in the care of Diondre Pulis..   We ask that [name] return to clinic in 3 weeks following next brain MRI, or sooner as needed.  All  questions were answered. The patient knows to call the clinic with any problems, questions or concerns. No barriers to learning were detected.  The total time spent in the encounter was 40 minutes and more than 50% was on counseling and review of test results   Ventura Sellers, MD Medical Director of Neuro-Oncology [facility] [place] 04/18/22 11:48 AM

## 2022-04-18 NOTE — Progress Notes (Signed)
New referral, pt of Dr Rogue Bussing, pt diagnosed with encephalitis and confusion.  Pt has moved in with daughter.

## 2022-04-22 DIAGNOSIS — R0981 Nasal congestion: Secondary | ICD-10-CM | POA: Diagnosis not present

## 2022-04-22 DIAGNOSIS — C3432 Malignant neoplasm of lower lobe, left bronchus or lung: Secondary | ICD-10-CM | POA: Diagnosis not present

## 2022-04-22 DIAGNOSIS — J849 Interstitial pulmonary disease, unspecified: Secondary | ICD-10-CM | POA: Diagnosis not present

## 2022-04-22 DIAGNOSIS — G8929 Other chronic pain: Secondary | ICD-10-CM | POA: Diagnosis not present

## 2022-04-22 DIAGNOSIS — N529 Male erectile dysfunction, unspecified: Secondary | ICD-10-CM | POA: Diagnosis not present

## 2022-04-22 DIAGNOSIS — I6523 Occlusion and stenosis of bilateral carotid arteries: Secondary | ICD-10-CM | POA: Diagnosis not present

## 2022-04-22 DIAGNOSIS — I10 Essential (primary) hypertension: Secondary | ICD-10-CM | POA: Diagnosis not present

## 2022-04-22 DIAGNOSIS — Z791 Long term (current) use of non-steroidal anti-inflammatories (NSAID): Secondary | ICD-10-CM | POA: Diagnosis not present

## 2022-04-22 DIAGNOSIS — N400 Enlarged prostate without lower urinary tract symptoms: Secondary | ICD-10-CM | POA: Diagnosis not present

## 2022-04-22 DIAGNOSIS — Z79899 Other long term (current) drug therapy: Secondary | ICD-10-CM | POA: Diagnosis not present

## 2022-04-22 DIAGNOSIS — B1009 Other human herpesvirus encephalitis: Secondary | ICD-10-CM | POA: Diagnosis not present

## 2022-04-22 DIAGNOSIS — Z7982 Long term (current) use of aspirin: Secondary | ICD-10-CM | POA: Diagnosis not present

## 2022-04-22 DIAGNOSIS — G929 Unspecified toxic encephalopathy: Secondary | ICD-10-CM | POA: Diagnosis not present

## 2022-04-22 DIAGNOSIS — I70229 Atherosclerosis of native arteries of extremities with rest pain, unspecified extremity: Secondary | ICD-10-CM | POA: Diagnosis not present

## 2022-04-22 DIAGNOSIS — Z9181 History of falling: Secondary | ICD-10-CM | POA: Diagnosis not present

## 2022-04-22 DIAGNOSIS — H9190 Unspecified hearing loss, unspecified ear: Secondary | ICD-10-CM | POA: Diagnosis not present

## 2022-04-25 DIAGNOSIS — G929 Unspecified toxic encephalopathy: Secondary | ICD-10-CM

## 2022-04-25 DIAGNOSIS — Z791 Long term (current) use of non-steroidal anti-inflammatories (NSAID): Secondary | ICD-10-CM

## 2022-04-25 DIAGNOSIS — N400 Enlarged prostate without lower urinary tract symptoms: Secondary | ICD-10-CM

## 2022-04-25 DIAGNOSIS — Z79899 Other long term (current) drug therapy: Secondary | ICD-10-CM

## 2022-04-25 DIAGNOSIS — C3432 Malignant neoplasm of lower lobe, left bronchus or lung: Secondary | ICD-10-CM

## 2022-04-25 DIAGNOSIS — R0981 Nasal congestion: Secondary | ICD-10-CM

## 2022-04-25 DIAGNOSIS — J849 Interstitial pulmonary disease, unspecified: Secondary | ICD-10-CM

## 2022-04-25 DIAGNOSIS — Z7982 Long term (current) use of aspirin: Secondary | ICD-10-CM

## 2022-04-25 DIAGNOSIS — I6523 Occlusion and stenosis of bilateral carotid arteries: Secondary | ICD-10-CM

## 2022-04-25 DIAGNOSIS — Z9181 History of falling: Secondary | ICD-10-CM

## 2022-04-25 DIAGNOSIS — H9190 Unspecified hearing loss, unspecified ear: Secondary | ICD-10-CM

## 2022-04-25 DIAGNOSIS — N529 Male erectile dysfunction, unspecified: Secondary | ICD-10-CM

## 2022-04-25 DIAGNOSIS — I70229 Atherosclerosis of native arteries of extremities with rest pain, unspecified extremity: Secondary | ICD-10-CM

## 2022-04-25 DIAGNOSIS — I10 Essential (primary) hypertension: Secondary | ICD-10-CM

## 2022-04-25 DIAGNOSIS — G8929 Other chronic pain: Secondary | ICD-10-CM

## 2022-04-25 DIAGNOSIS — B1009 Other human herpesvirus encephalitis: Secondary | ICD-10-CM

## 2022-04-26 ENCOUNTER — Other Ambulatory Visit: Payer: Self-pay | Admitting: Family Medicine

## 2022-04-29 DIAGNOSIS — B1009 Other human herpesvirus encephalitis: Secondary | ICD-10-CM | POA: Diagnosis not present

## 2022-04-29 DIAGNOSIS — C3432 Malignant neoplasm of lower lobe, left bronchus or lung: Secondary | ICD-10-CM | POA: Diagnosis not present

## 2022-04-29 DIAGNOSIS — Z7982 Long term (current) use of aspirin: Secondary | ICD-10-CM | POA: Diagnosis not present

## 2022-04-29 DIAGNOSIS — Z791 Long term (current) use of non-steroidal anti-inflammatories (NSAID): Secondary | ICD-10-CM | POA: Diagnosis not present

## 2022-04-29 DIAGNOSIS — Z79899 Other long term (current) drug therapy: Secondary | ICD-10-CM | POA: Diagnosis not present

## 2022-04-29 DIAGNOSIS — I10 Essential (primary) hypertension: Secondary | ICD-10-CM | POA: Diagnosis not present

## 2022-04-29 DIAGNOSIS — R0981 Nasal congestion: Secondary | ICD-10-CM | POA: Diagnosis not present

## 2022-04-29 DIAGNOSIS — G929 Unspecified toxic encephalopathy: Secondary | ICD-10-CM | POA: Diagnosis not present

## 2022-04-29 DIAGNOSIS — I70229 Atherosclerosis of native arteries of extremities with rest pain, unspecified extremity: Secondary | ICD-10-CM | POA: Diagnosis not present

## 2022-04-29 DIAGNOSIS — H9190 Unspecified hearing loss, unspecified ear: Secondary | ICD-10-CM | POA: Diagnosis not present

## 2022-04-29 DIAGNOSIS — N529 Male erectile dysfunction, unspecified: Secondary | ICD-10-CM | POA: Diagnosis not present

## 2022-04-29 DIAGNOSIS — Z9181 History of falling: Secondary | ICD-10-CM | POA: Diagnosis not present

## 2022-04-29 DIAGNOSIS — G8929 Other chronic pain: Secondary | ICD-10-CM | POA: Diagnosis not present

## 2022-04-29 DIAGNOSIS — I6523 Occlusion and stenosis of bilateral carotid arteries: Secondary | ICD-10-CM | POA: Diagnosis not present

## 2022-04-29 DIAGNOSIS — J849 Interstitial pulmonary disease, unspecified: Secondary | ICD-10-CM | POA: Diagnosis not present

## 2022-04-29 DIAGNOSIS — N400 Enlarged prostate without lower urinary tract symptoms: Secondary | ICD-10-CM | POA: Diagnosis not present

## 2022-04-29 MED ORDER — PANTOPRAZOLE SODIUM 40 MG PO TBEC: 40.0000 mg | DELAYED_RELEASE_TABLET | Freq: Every day | ORAL | 1 refills | Status: DC

## 2022-05-05 ENCOUNTER — Ambulatory Visit
Admission: RE | Admit: 2022-05-05 | Discharge: 2022-05-05 | Disposition: A | Discharge status: Home / Self Care | Payer: PPO | Source: Ambulatory Visit | Attending: Internal Medicine | Admitting: Internal Medicine

## 2022-05-05 DIAGNOSIS — G0490 Encephalitis and encephalomyelitis, unspecified: Secondary | ICD-10-CM | POA: Diagnosis not present

## 2022-05-05 DIAGNOSIS — B1009 Other human herpesvirus encephalitis: Secondary | ICD-10-CM | POA: Insufficient documentation

## 2022-05-05 DIAGNOSIS — R930 Abnormal findings on diagnostic imaging of skull and head, not elsewhere classified: Secondary | ICD-10-CM | POA: Diagnosis not present

## 2022-05-05 DIAGNOSIS — G9389 Other specified disorders of brain: Secondary | ICD-10-CM | POA: Diagnosis not present

## 2022-05-05 MED ORDER — GADOTERIDOL 279.3 MG/ML IV SOLN: 6.0000 mL | Freq: Once | INTRAVENOUS | Status: DC | PRN

## 2022-05-05 MED ORDER — GADOBUTROL 1 MMOL/ML IV SOLN
9.0000 mL | Freq: Once | INTRAVENOUS | Status: AC | PRN
  Administered 2022-05-05: 9 mL via INTRAVENOUS

## 2022-05-05 MED ORDER — GADOTERIDOL 279.3 MG/ML IV SOLN
9.0000 mL | Freq: Once | INTRAVENOUS | Status: DC | PRN
  Filled 2022-05-05: qty 9

## 2022-05-06 ENCOUNTER — Ambulatory Visit: Payer: PPO | Admitting: Neurology

## 2022-05-09 ENCOUNTER — Inpatient Hospital Stay: Payer: PPO | Admitting: Internal Medicine

## 2022-05-09 ENCOUNTER — Ambulatory Visit: Payer: PPO | Admitting: Internal Medicine

## 2022-05-09 ENCOUNTER — Encounter: Payer: Self-pay | Admitting: Internal Medicine

## 2022-05-09 ENCOUNTER — Inpatient Hospital Stay (HOSPITAL_BASED_OUTPATIENT_CLINIC_OR_DEPARTMENT_OTHER): Payer: PPO | Admitting: Internal Medicine

## 2022-05-09 ENCOUNTER — Inpatient Hospital Stay: Payer: PPO | Attending: Internal Medicine

## 2022-05-09 VITALS — BP 107/65 | HR 93 | Resp 18 | Wt 216.0 lb

## 2022-05-09 DIAGNOSIS — R29818 Other symptoms and signs involving the nervous system: Secondary | ICD-10-CM | POA: Diagnosis not present

## 2022-05-09 DIAGNOSIS — G8929 Other chronic pain: Secondary | ICD-10-CM | POA: Diagnosis not present

## 2022-05-09 DIAGNOSIS — Z79899 Other long term (current) drug therapy: Secondary | ICD-10-CM | POA: Diagnosis not present

## 2022-05-09 DIAGNOSIS — B004 Herpesviral encephalitis: Secondary | ICD-10-CM | POA: Diagnosis not present

## 2022-05-09 DIAGNOSIS — Z955 Presence of coronary angioplasty implant and graft: Secondary | ICD-10-CM | POA: Insufficient documentation

## 2022-05-09 DIAGNOSIS — Z7952 Long term (current) use of systemic steroids: Secondary | ICD-10-CM | POA: Diagnosis not present

## 2022-05-09 DIAGNOSIS — I2510 Atherosclerotic heart disease of native coronary artery without angina pectoris: Secondary | ICD-10-CM | POA: Insufficient documentation

## 2022-05-09 DIAGNOSIS — B1009 Other human herpesvirus encephalitis: Secondary | ICD-10-CM

## 2022-05-09 DIAGNOSIS — C3432 Malignant neoplasm of lower lobe, left bronchus or lung: Secondary | ICD-10-CM | POA: Insufficient documentation

## 2022-05-09 DIAGNOSIS — Z7982 Long term (current) use of aspirin: Secondary | ICD-10-CM | POA: Insufficient documentation

## 2022-05-09 DIAGNOSIS — Z7902 Long term (current) use of antithrombotics/antiplatelets: Secondary | ICD-10-CM | POA: Insufficient documentation

## 2022-05-09 DIAGNOSIS — M549 Dorsalgia, unspecified: Secondary | ICD-10-CM | POA: Insufficient documentation

## 2022-05-09 DIAGNOSIS — Z87891 Personal history of nicotine dependence: Secondary | ICD-10-CM | POA: Diagnosis not present

## 2022-05-09 LAB — CBC WITH DIFFERENTIAL/PLATELET
Abs Immature Granulocytes: 0.06 10*3/uL (ref 0.00–0.07)
Basophils Absolute: 0.1 10*3/uL (ref 0.0–0.1)
Basophils Relative: 1 %
Eosinophils Absolute: 0.3 10*3/uL (ref 0.0–0.5)
Eosinophils Relative: 5 %
HCT: 43.2 % (ref 39.0–52.0)
Hemoglobin: 14.9 g/dL (ref 13.0–17.0)
Immature Granulocytes: 1 %
Lymphocytes Relative: 16 %
Lymphs Abs: 1 10*3/uL (ref 0.7–4.0)
MCH: 32.3 pg (ref 26.0–34.0)
MCHC: 34.5 g/dL (ref 30.0–36.0)
MCV: 93.7 fL (ref 80.0–100.0)
Monocytes Absolute: 0.9 10*3/uL (ref 0.1–1.0)
Monocytes Relative: 15 %
Neutro Abs: 3.8 10*3/uL (ref 1.7–7.7)
Neutrophils Relative %: 62 %
Platelets: 167 10*3/uL (ref 150–400)
RBC: 4.61 MIL/uL (ref 4.22–5.81)
RDW: 14.3 % (ref 11.5–15.5)
WBC: 6 10*3/uL (ref 4.0–10.5)
nRBC: 0 % (ref 0.0–0.2)

## 2022-05-09 LAB — COMPREHENSIVE METABOLIC PANEL
ALT: 21 U/L (ref 0–44)
AST: 19 U/L (ref 15–41)
Albumin: 3.9 g/dL (ref 3.5–5.0)
Alkaline Phosphatase: 56 U/L (ref 38–126)
Anion gap: 9 (ref 5–15)
BUN: 16 mg/dL (ref 8–23)
CO2: 23 mmol/L (ref 22–32)
Calcium: 9.2 mg/dL (ref 8.9–10.3)
Chloride: 106 mmol/L (ref 98–111)
Creatinine, Ser: 0.94 mg/dL (ref 0.61–1.24)
GFR, Estimated: >60 mL/min (ref >60)
Glucose, Bld: 137 mg/dL — ABNORMAL HIGH (ref 70–99)
Potassium: 3.6 mmol/L (ref 3.5–5.1)
Sodium: 138 mmol/L (ref 135–145)
Total Bilirubin: 0.7 mg/dL (ref 0.3–1.2)
Total Protein: 6.9 g/dL (ref 6.5–8.1)

## 2022-05-09 LAB — TSH: TSH: 1.003 u[IU]/mL (ref 0.350–4.500)

## 2022-05-09 MED ORDER — ALBUTEROL SULFATE HFA 108 (90 BASE) MCG/ACT IN AERS: 2.0000 | INHALATION_SPRAY | Freq: Four times a day (QID) | RESPIRATORY_TRACT | 2 refills | Status: AC | PRN

## 2022-05-09 MED ORDER — HEPARIN SOD (PORK) LOCK FLUSH 100 UNIT/ML IV SOLN
500.0000 [IU] | Freq: Once | INTRAVENOUS | Status: AC
  Administered 2022-05-09: 500 [IU] via INTRAVENOUS
  Filled 2022-05-09: qty 5

## 2022-05-09 MED ORDER — SODIUM CHLORIDE 0.9% FLUSH
10.0000 mL | INTRAVENOUS | Status: DC | PRN
  Administered 2022-05-09: 10 mL via INTRAVENOUS
  Filled 2022-05-09: qty 10

## 2022-05-09 MED ORDER — HEPARIN SOD (PORK) LOCK FLUSH 100 UNIT/ML IV SOLN
INTRAVENOUS | Status: AC
  Filled 2022-05-09: qty 5

## 2022-05-09 NOTE — Progress Notes (Signed)
[facility] [place], [place] [address] [phone]   Interval Evaluation  Date of Service: 05/09/22 Patient Name: Randall Ali. Patient MRN: [id number] Patient DOB: 1941-02-23 Provider: Ventura Sellers, MD  Identifying Statement:  Randall Ali. is a 81 y.o. 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    Referring Provider: Ria Bush, [address] [place],  [place] [id number]  Primary Cancer:  Oncologic History: Oncology History Overview Note  #MAY 2022-Lung cancer-non-small cell [CT guided bx] T3N1 vs stage IV Dr.Hendrickson.  MRI brain negative for malignancy.# 1. April 2022- LLL ~4.0 cm mass in the superior segment left lower lobe abuts the major fissure and the posterior pleural surface without visible chest wall invasion without pleural effusion. There 2-3 other small nodules in the left lower lobe, largest measures 9 mm in diameter. These are suspicious for same lobe satellite lesions and there is left hilar adenopathy. Assuming non-small cell lung cancer the appearance is compatible with T3 N1 M0 disease (stage IIIA).  # right suprarenal nodule-awaiting biopsy on 5/31- non-small cell [revived at tumor conference at [place]  # June 9th 2022- CARBO-TAXOl-KEYTRUDA; Fulphila  # ? Subtle adrenal insufficiency- No acute process - -September MRI brain negative for any pituitary hypophysitis/brain metastasis. Prednisone   MOLECULAR TESTING: NGS TPS-PDL 100%; EXON 12 amplification* mutations.  # CAD [CABG 2016; Dr.Gollan]   Cancer of lower lobe of left lung ([place])  11/02/2020 Initial Diagnosis   Cancer of lower lobe of left lung ([place])   11/02/2020 Cancer Staging   Staging form: Lung, AJCC 8th Edition - Clinical: Stage IVA (cT3, cN1, cM1a) - Signed by Cammie Sickle, MD on  11/02/2020   11/16/2020 - 01/02/2022 Chemotherapy   Patient is on Treatment Plan : LUNG NSCLC Carboplatin + Paclitaxel + Pembrolizumab q21d x 4 cycles / Pembrolizumab Maintenance Q21D     11/16/2020 -  Chemotherapy   Patient is on Treatment Plan : LUNG NSCLC Carboplatin (6) + Paclitaxel (200) + Pembrolizumab (200) D1 q21d x 4 cycles / Pembrolizumab (200) Maintenance D1 q21d       Interval History: Randall Ali. Presents today for follow up after recent MRI brain.  No new or progressive neurologic changes reported.  Remains with dense memory impairment, unchanged from prior.  Requires assistance with some ADLs at home.  Headaches have become more sporadic.  Remains intermittently compliant with CPAP.  H+P (04/18/22) Patient presents for evaluation today following recent admission for herpes encephalitis.  He was admitted to [facility] on 02/20/22 for progressive confusion, disorganized thinking, memory impairment.  He was monitored and treated with EEG and valacyclovir x14 days.  Per daughter, confusion and memory impairment improved modestly during the admission.  Currently, he still cannot remember most conversations.  He dresses, feeds, toilets himself, but does "forget what he is doing when standing in the shower".  Daughter is now living with him to help with ADLs, prior to this he was completely independent.  Also complains of headaches "in my temples" every day since discharge.  He has been dosing tylenol 500mg  twice per day for ~2 months.  Intermittently non-compliant with CPAP during that time.  Medications: Current Outpatient Medications on File Prior to Visit  Medication Sig Dispense Refill   acetaminophen (TYLENOL) 325 MG tablet Take 650 mg by mouth as needed for  moderate pain or mild pain.     albuterol (VENTOLIN HFA) 108 (90 Base) MCG/ACT inhaler Inhale 2 puffs into the lungs every 6 (six) hours as needed for wheezing or shortness of breath. 18 g 2   Ascorbic Acid (VITAMIN C) 1000 MG  tablet Take 1 tablet (1,000 mg total) by mouth daily.     aspirin 81 MG EC tablet Take 81 mg by mouth daily.       Calcium-Magnesium-Vitamin D (CALCIUM 1200+D3 PO) Take 1 tablet by mouth daily.     Carboxymethylcellul-Glycerin (LUBRICATING EYE DROPS OP) Place 1 drop into both eyes daily as needed (irritation). (Patient not taking: Reported on 04/18/2022)     cetirizine (ZYRTEC) 10 MG chewable tablet Chew 10 mg by mouth daily.     Cholecalciferol (VITAMIN D3) 50 MCG (2000 UT) TABS Take 2,000 Units by mouth daily. 30 tablet    Cyanocobalamin (B-12) 5000 MCG CAPS Take 5,000 mcg by mouth daily.     docusate sodium (COLACE) 100 MG capsule Take 200 mg by mouth at bedtime.     finasteride (PROSCAR) 5 MG tablet Take 1 tablet (5 mg total) by mouth daily. 30 tablet 6   fluticasone (FLONASE) 50 MCG/ACT nasal spray Place 2 sprays into both nostrils daily.     guaiFENesin (MUCINEX) 600 MG 12 hr tablet Take by mouth 2 (two) times daily.     lidocaine-prilocaine (EMLA) cream Apply 30 -45 mins prior to port access. 30 g 3   Melatonin 10 MG CAPS Take 10 mg by mouth at bedtime as needed (sleep).     nitroGLYCERIN (NITROSTAT) 0.4 MG SL tablet Place 1 tablet (0.4 mg total) under the tongue every 5 (five) minutes as needed for chest pain. 25 tablet 3   olopatadine (PATANOL) 0.1 % ophthalmic solution Place 1 drop into both eyes 2 (two) times daily. (Patient not taking: Reported on 04/18/2022) 5 mL 12   pantoprazole (PROTONIX) 40 MG tablet Take 1 tablet (40 mg total) by mouth daily. 90 tablet 1   predniSONE (DELTASONE) 50 MG tablet Take 1 tablet (50 mg total) by mouth daily with breakfast. 7 tablet 0   prochlorperazine (COMPAZINE) 10 MG tablet Take 1 tablet (10 mg total) by mouth every 6 (six) hours as needed for nausea or vomiting. (Patient not taking: Reported on 04/18/2022) 40 tablet 1   simvastatin (ZOCOR) 40 MG tablet Take 1 tablet (40 mg total) by mouth at bedtime. 90 tablet 3   traMADol (ULTRAM) 50 MG tablet TAKE  1 TABLET BY MOUTH 3 TIMES DAILY AS NEEDED FOR MODERATE PAIN (Patient not taking: Reported on 04/18/2022) 90 tablet 0   valsartan (DIOVAN) 80 MG tablet Take 0.5 tablets (40 mg total) by mouth daily. One daily     Vitamin E 450 MG (1000 UT) CAPS Take 450 Units by mouth daily.     Zinc 50 MG TABS Take 50 mg by mouth daily.     Current Facility-Administered Medications on File Prior to Visit  Medication Dose Route Frequency Provider Last Rate Last Admin   heparin lock flush 100 UNIT/ML injection             Allergies: No Known Allergies Past Medical History:  Past Medical History:  Diagnosis Date   Allergy    seasonal   Arthritis    all over- in general    CAD (coronary artery disease)    a. inferior wall MI 10/01 s/p PCI/DES to RCA; b. Myoview 3/16 neg for ischemia; c. LHC  8/16: ostLAD 80%, OM1 70%, OM2 70% x 2 lesions, mRCA 30%, dRCA 70% s/p 4-V CABG 01/24/15 (LIMA-LAD, VG- OM1, VG-OM2, VG-PDA)    Cancer (HCC)    skin, melanoma   Carotid artery disease ([place])    a. [place] 8/16: 1-39% bilateral ICA stenosis   Cataract    removed   Diastolic dysfunction    a. TTE 8/16: EF 55-60%, no RWMA, Gr1DD, calcified mitral annulus, mild biatrial enlargement   Erectile dysfunction    GERD (gastroesophageal reflux disease)    History of elbow surgery    History of hiatal hernia    HLD (hyperlipidemia)    HTN (hypertension)    Inferior myocardial infarction ([place]) 03/2000   stent RCA   Lung cancer ([place])    Postoperative wound infection 02/02/2015   Reflux esophagitis    Sleep apnea 2017   CPAP at night   Past Surgical History:  Past Surgical History:  Procedure Laterality Date   arm surgery  2010   BROW LIFT Bilateral 11/25/2019   Procedure: BROW PTOSIS REPAIR BILATERAL;  Surgeon: Karle Starch, MD;  Location: [facility];  Service: Ophthalmology;  Laterality: Bilateral;  sleep apnea   CARDIAC CATHETERIZATION  06/24/2011   CARDIAC CATHETERIZATION N/A 01/18/2015   Procedure: Left  Heart Cath with coronary angiography;  Surgeon: Minna Merritts, MD;  Location: [facility];  Service: Cardiovascular;  Laterality: N/A;   CARDIAC CATHETERIZATION N/A 01/18/2015   Procedure: Intravascular Pressure Wire/FFR Study;  Surgeon: [name], MD;  Location: [facility];  Service: Cardiovascular;  Laterality: N/A;   CAROTID STENT  03/10/2011   COLONOSCOPY  2010   COLONOSCOPY  06/14/2014   Dr Hilarie Fredrickson   COLONOSCOPY  01/2022   done for colon thickening at splenic flexure - 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[name])   CORONARY ARTERY BYPASS GRAFT N/A 01/24/2015   Procedure: CORONARY ARTERY BYPASS GRAFTING x 4 (LIMA-LAD, SVG-Int 1- Int 2, SVG-PD) ENDOSCOPIC GREATER SAPHENOUS VEIN HARVEST LEFT LEG;  Surgeon: [name], MD;  Location: [facility];  Service: Open Heart Surgery;  Laterality: N/A;   EMBOLECTOMY  06/15/2019   Procedure: EMBOLECTOMY;  Surgeon: Katha Cabal, MD;  Location: [facility];  Service: Vascular;;  right superficial femoral artery   ENDARTERECTOMY FEMORAL Right 06/15/2019   Procedure: ENDARTERECTOMY FEMORAL;  Surgeon: Katha Cabal, MD;  Location: [facility];  Service: Vascular;  Laterality: Right;  common femoral profunda femoris superficial femoral   ESOPHAGOGASTRODUODENOSCOPY (EGD) WITH PROPOFOL N/A 04/24/2016   Procedure: ESOPHAGOGASTRODUODENOSCOPY (EGD) WITH PROPOFOL;  Surgeon: Jerene Bears, MD;  Location: [facility];  Service: Gastroenterology;  Laterality: N/A;   EYE SURGERY     lasik 15 yrs. ago, cataracts removed - both eyes    HAMMER TOE SURGERY     right toe   INSERTION OF ILIAC STENT Right 06/15/2019   Procedure: INSERTION OF ILIAC STENT ( STENTING OF SFA/POP ARTERY );  Surgeon: Katha Cabal, MD;  Location: [facility];  Service: Vascular;  Laterality: Right;  angioplpasty and stent placement: right superficial femoral right tibiopopliteal trunk bilateral common iliac arteries   IR IMAGING GUIDED PORT INSERTION   11/09/2020   LEFT HEART CATH AND CORONARY ANGIOGRAPHY Left 06/10/2017   Procedure: LEFT HEART CATH AND CORONARY ANGIOGRAPHY;  Surgeon: Minna Merritts, MD;  Location: [facility];  Service: Cardiovascular;  Laterality: Left;   LOWER EXTREMITY ANGIOGRAPHY Left 01/04/2019   Procedure: LOWER EXTREMITY ANGIOGRAPHY;  Surgeon: Hortencia Pilar  G, MD;  Location: [facility];  Service: Cardiovascular;  Laterality: Left;   LOWER EXTREMITY ANGIOGRAPHY Right 01/25/2019   Procedure: LOWER EXTREMITY ANGIOGRAPHY;  Surgeon: Katha Cabal, MD;  Location: [employer] [facility];  Service: Cardiovascular;  Laterality: Right;   LOWER EXTREMITY ANGIOGRAPHY Right 01/15/2021   LOWER EXTREMITY ANGIOGRAPHY and stent placement to R SFA and popliteal artery Delana Meyer, Dolores Lory, MD)   NASAL SINUS SURGERY  2008   septpolasty, bilateral turbinate reduction   SHOULDER ARTHROSCOPY  2012   TEE WITHOUT CARDIOVERSION N/A 01/24/2015   Procedure: TRANSESOPHAGEAL ECHOCARDIOGRAM (TEE);  Surgeon: [name], MD;  Location: [facility];  Service: Open Heart Surgery;  Laterality: N/A;   TOE SURGERY  1994   UPPER GI ENDOSCOPY  07/2014, 04-24-16   Dr Raquel James   WRIST SURGERY  2011   Social History:  Social History   Socioeconomic History   Marital status: Single    Spouse name: Not on file   Number of children: Not on file   Years of education: 12   Highest education level: Not on file  Occupational History   Occupation: retired    Comment: [employer]  Tobacco Use   Smoking status: Former    Packs/day: 2.50    Years: 40.00    Total pack years: 100.00    Types: Cigarettes    Quit date: 03/24/2000    Years since quitting: 22.1   Smokeless tobacco: Never  Vaping Use   Vaping Use: Never used  Substance and Sexual Activity   Alcohol use: Yes    Comment: occassionally   Drug use: No   Sexual activity: Yes  Other Topics Concern   Not on file  Social History Narrative   Singled; lives with son  and dog    Occ: retired, back part time at [employer];    Activity: gym 4-5d/wk   Diet: good water, fruits/vegetables daily   Caffeine Use-yes      H/o Armed forces logistics/support/administrative officer   ------------------------------------       Leisure centre manager- retd; [employer] store- retd; quit smoking 2001. Alcohol couple nights a week. Live in [place]. Daughter lives 10 mins.    Social Determinants of Health   Financial Resource Strain: Low Risk  (09/26/2021)   Overall Financial Resource Strain (CARDIA)    Difficulty of Paying Living Expenses: Not hard at all  Food Insecurity: No Food Insecurity (02/14/2022)   Hunger Vital Sign    Worried About Running Out of Food in the Last Year: Never true    Ran Out of Food in the Last Year: Never true  Transportation Needs: No Transportation Needs (02/14/2022)   PRAPARE - Hydrologist (Medical): No    Lack of Transportation (Non-Medical): No  Physical Activity: Sufficiently Active (09/26/2021)   Exercise Vital Sign    Days of Exercise per Week: 5 days    Minutes of Exercise per Session: 30 min  Stress: No Stress Concern Present (09/26/2021)   [place]    Feeling of Stress : Only a little  Social Connections: Socially Isolated (09/26/2021)   Social Connection and Isolation Panel [NHANES]    Frequency of Communication with Friends and Family: Twice a week    Frequency of Social Gatherings with Friends and Family: Twice a week    Attends Religious Services: Never    Marine scientist or Organizations: Not on file    Attends [employer]  or Organization Meetings: Never    Marital Status: Never married  Intimate Partner Violence: Not At Risk (02/14/2022)   Humiliation, Afraid, Rape, and Kick questionnaire    Fear of Current or Ex-Partner: No    Emotionally Abused: No    Physically Abused: No    Sexually Abused: No   Family History:  Family History  Problem Relation Age of Onset    Hypertension Mother    Heart disease Mother    Hypertension Father    Diabetes Father    Lymphoma Sister    Heart disease Brother 76   Cancer Paternal Grandfather    Colon cancer Neg Hx    Prostate cancer Neg Hx    Bladder Cancer Neg Hx    Kidney cancer Neg Hx    Colon polyps Neg Hx    Esophageal cancer Neg Hx    Pancreatic cancer Neg Hx    Stomach cancer Neg Hx     Review of Systems: Constitutional: Doesn't report fevers, chills or abnormal weight loss Eyes: Doesn't report blurriness of vision Ears, nose, mouth, throat, and face: Doesn't report sore throat Respiratory: Doesn't report cough, dyspnea or wheezes Cardiovascular: Doesn't report palpitation, chest discomfort  Gastrointestinal:  Doesn't report nausea, constipation, diarrhea GU: Doesn't report incontinence Skin: Doesn't report skin rashes Neurological: Per HPI Musculoskeletal: Doesn't report joint pain Behavioral/Psych: Doesn't report anxiety  Physical Exam: Vitals:   05/09/22 0944  BP: 107/65  Pulse: 93  Resp: 18  SpO2: 98%    KPS: 60. General: Alert, cooperative, pleasant, in no acute distress Head: Normal EENT: No conjunctival injection or scleral icterus.  Lungs: Resp effort normal Cardiac: Regular rate Abdomen: Non-distended abdomen Skin: No rashes cyanosis or petechiae. Extremities: No clubbing or edema  Neurologic Exam: Mental Status: Awake, alert, attentive to examiner. Oriented to self and environment. Language is fluent with intact comprehension.  Age advanced psychomotor slowing, marked anterograde amnesia.  Cranial Nerves: Visual acuity is grossly normal. Visual fields are full. Extra-ocular movements intact. No ptosis. Face is symmetric Motor: Tone and bulk are normal. Power is full in both arms and legs. Reflexes are symmetric, no pathologic reflexes present.  Sensory: Intact to light touch Gait: Normal.   Labs: I have reviewed the data as listed    Component Value Date/Time   NA  139 04/04/2022 0852   NA 138 01/15/2015 0956   K 4.0 04/04/2022 0852   CL 108 04/04/2022 0852   CO2 24 04/04/2022 0852   GLUCOSE 103 (H) 04/04/2022 0852   BUN 14 04/04/2022 0852   BUN 16 01/15/2015 0956   CREATININE 0.90 04/04/2022 0852   CALCIUM 9.2 04/04/2022 0852   PROT 7.1 04/04/2022 0852   ALBUMIN 4.1 04/04/2022 0852   AST 18 04/04/2022 0852   ALT 14 04/04/2022 0852   ALKPHOS 57 04/04/2022 0852   BILITOT 0.6 04/04/2022 0852   GFRNONAA >60 04/04/2022 0852   GFRAA >60 06/16/2019 1126   Lab Results  Component Value Date   WBC 7.1 04/04/2022   NEUTROABS 4.7 04/04/2022   HGB 14.2 04/04/2022   HCT 41.3 04/04/2022   MCV 93.0 04/04/2022   PLT 210 04/04/2022    Imaging: [facility] MRI Brain W Wo Contrast  Result Date: 02/22/2022 EXAM: Magnetic resonance imaging, brain, without and with contrast material. DATE: 02/22/2022 10:11 AM ACCESSION: [id number] UN DICTATED: 02/22/2022 12:37 PM INTERPRETATION LOCATION: [facility] CLINICAL INDICATION: 81 years old Male with Nonsmall cell lung cancer. Evaluate for leptomeningeal disease COMPARISON: CT head 02/20/2022 TECHNIQUE:  Multiplanar, multisequence MR imaging of the brain was performed without and with I.V. contrast. FINDINGS: There are scattered foci of signal abnormality within the periventricular and deep white matter. These are nonspecific but commonly seen with small vessel ischemic changes. On T2-weighted and FLAIR images, there is abnormal signal involving the posterior portions of the left medial temporal lobe, including the hippocampus, with extension into the occipital lobe. Postcontrast images demonstrate curvilinear enhancement which appears to track along the collateral sulcus, although evaluation of this finding is somewhat limited given significant motion on several of the postcontrast sequences. Ventricles are normal in size. There is no midline shift. No extra-axial fluid collection. No evidence of intracranial hemorrhage. No  diffusion weighted signal abnormality to suggest acute infarct. No mass.   --Abnormal enhancement which appears to track along the left collateral sulcus with surrounding parenchymal edema involving the left medial temporal lobe and adjacent occipital lobe. These findings could reflect leptomeningeal metastatic disease, although the signal abnormality involving the hippocampus could alternatively be due to seizures or an infectious or autoimmune/paraneoplastic process. Recommend clinical correlation and evaluation with CSF sampling.   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 Presents today for follow up after recent MRI brain.  He remains with dense anterograde amnesia, unchanged from prior.    MRI brain demonstrates stable findings when compared to [facility] study from September during his hopsitalization.    We recommended deferring further workup, given stable clinical and radiographic findings.  He was agreeable with repeating an MRI brain in 6 months to further assess recovery process.   Prior: presents with clinical and radiographic syndrome consistent with HSV2 encephalitis, confirmed by CSF PCR.  He was treated with full course of valtrex, Vimpat was stopped due to lack of seizures.    Since discharge, he remains with static encephalopathy, worse from prior baseline.  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  He is not declining at this time.  Role of cancer and immunotherapy with regards to his encephalitis is unclear.  Headache has been persistent, tension type at this time.  Typically would have expected resolution at this point. No meningisumus clinically.  Analgesia overuse with standing Tylenol BID could be contributing at this stage to rebound headache.  We recommended a short course of prednisone  50mg  daily x7 days, to help with headache and allow for gradual withdrawal of analgesia.  We would like to obtain an additional MRI brain for review given ongoing cognitive deficits, headaches.     We appreciate the opportunity to participate in the care of Sherman Donaldson..   We ask that Randall Ali. return to clinic in 6 months following next brain MRI, or sooner as needed.  All questions were answered. The patient knows to call the clinic with any problems, questions or concerns. No barriers to learning were detected.  The total time spent in the encounter was 30 minutes and more than 50% was on counseling and review of test results   Ventura Sellers, MD Medical Director of Neuro-Oncology [facility] [place] 05/09/22 9:28 AM

## 2022-05-09 NOTE — Assessment & Plan Note (Addendum)
#   LL[facility]- Lung cancer-non-small cell stage IV [right suprarenal nodule-s/p biopsy "non-small cell ca"].  S/p  CarboTaxol plus Keytruda x4; currently on single agent Keytruda-; CTA scan- SEP 25th, 2023- [hospital]-  No evidence of pulmonary embolism; The treated lesion in the superior segment of the left lower lobe appears stable compared to the prior study. No new suspicious appearing pulmonary nodules or lymphadenopathy noted elsewhere in the thorax.   # Last [name] was July, 27th, 2023-I had a long discussion with the patient and his daughter, Kim-regarding continued holding of [place].  Discussed that it is quite possible that patient's encephalitis could be result of immunosuppression from chemotherapy/steroids/and also immunotherapy.  Given overall stable disease on the CT scan-September 2023 I think it is okay to hold further therapy at this time.   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  Likely contributing to patient's cognitive deficits/  # CAD-s/p CABG-/PVD-s/p stent [Dr.Schneir]-improvement in pain noted.  On Plavix --STABLE.  # IV access: Mediport functioning.  # DISPOSITION: # follow up in 2 months with MD; port/ labs-cbc/cmp;TSH- No chemo; CT CAP- - Dr.B

## 2022-05-09 NOTE — Progress Notes (Signed)
[facility] NOTE  Patient Care Team: Ria Bush, MD as PCP - General (Family Medicine) [name] [name], MD as PCP - Cardiology (Cardiology) [name], MD (Internal Medicine) Minna Merritts, MD as Consulting Physician (Cardiology) Pieter Partridge, DO as Consulting Physician (Neurology) Cammie Sickle, MD as Consulting Physician (Hematology and Oncology) Telford Nab, RN as Oncology Nurse Navigator  CHIEF COMPLAINTS/PURPOSE OF CONSULTATION: lung cancer    Oncology History Overview Note  #MAY 2022-Lung cancer-non-small cell [CT guided bx] T3N1 vs stage IV Dr.Hendrickson.  MRI brain negative for malignancy.# 1. April 2022- LLL ~4.0 cm mass in the superior segment left lower lobe abuts the major fissure and the posterior pleural surface without visible chest wall invasion without pleural effusion. There 2-3 other small nodules in the left lower lobe, largest measures 9 mm in diameter. These are suspicious for same lobe satellite lesions and there is left hilar adenopathy. Assuming non-small cell lung cancer the appearance is compatible with T3 N1 M0 disease (stage IIIA).  # right suprarenal nodule-awaiting biopsy on 5/31- non-small cell [revived at tumor conference at [facility]  # June 9th 2022- CARBO-TAXOl-KEYTRUDA; Fulphila  # ? Subtle adrenal insufficiency- No acute process - -September MRI brain negative for any pituitary hypophysitis/brain metastasis. Prednisone   MOLECULAR TESTING: NGS TPS-PDL 100%; EXON 12 amplification* mutations.  # CAD [CABG 2016; Dr.Gollan]   Cancer of lower lobe of left lung ([place] [facility])  11/02/2020 Initial Diagnosis   Cancer of lower lobe of left lung ([facility])   11/02/2020 Cancer Staging   Staging form: Lung, AJCC 8th Edition - Clinical: Stage IVA (cT3, cN1, cM1a) - Signed by Cammie Sickle, MD on 11/02/2020   11/16/2020 - 01/02/2022 Chemotherapy   Patient is on Treatment Plan : LUNG NSCLC Carboplatin + Paclitaxel +  Pembrolizumab q21d x 4 cycles / Pembrolizumab Maintenance Q21D     11/16/2020 -  Chemotherapy   Patient is on Treatment Plan : LUNG NSCLC Carboplatin (6) + Paclitaxel (200) + Pembrolizumab (200) D1 q21d x 4 cycles / Pembrolizumab (200) Maintenance D1 q21d        HISTORY OF PRESENTING ILLNESS: Accompanied by daughter.  Walking independently.  [name] 81 y.o.  male lung cancer-non-small cell stage IV [supra-renal nodule s/p biopsy] currently on Keytruda is for follow-up/review results of the MRI brain.  [name] is on hold since July 2023 because of multiple hospitalizations/HSV encephalitis etc.  In the interim patient was evaluated by neuro oncology for recent history of HSV-2 encephalitis  Patient's breathing is okay.  However patient continues to have intermittent cognitive deficits.  He is currently living with his daughter in [place].   Currently getting physical therapy.  Review of Systems  Constitutional:  Positive for malaise/fatigue. Negative for chills, diaphoresis, fever and weight loss.  HENT:  Negative for nosebleeds and sore throat.   Eyes:  Negative for double vision.  Respiratory:  Negative for cough, hemoptysis, sputum production, shortness of breath and wheezing.   Cardiovascular:  Negative for chest pain, palpitations, orthopnea and leg swelling.  Gastrointestinal:  Negative for abdominal pain, blood in stool, constipation, diarrhea, heartburn, melena and vomiting.  Genitourinary:  Negative for dysuria, frequency and urgency.  Musculoskeletal:  Positive for back pain and joint pain.  Skin: Negative.  Negative for itching and rash.  Neurological:  Positive for dizziness and tremors. Negative for tingling, focal weakness, weakness and headaches.  Endo/Heme/Allergies:  Does not bruise/bleed easily.  Psychiatric/Behavioral:  Positive for memory loss. Negative  for depression. The patient is not nervous/anxious and does not have insomnia.      MEDICAL HISTORY:   Past Medical History:  Diagnosis Date   Allergy    seasonal   Arthritis    all over- in general    CAD (coronary artery disease)    a. inferior wall MI 10/01 s/p PCI/DES to RCA; b. Myoview 3/16 neg for ischemia; c. LHC 8/16: ostLAD 80%, OM1 70%, OM2 70% x 2 lesions, mRCA 30%, dRCA 70% s/p 4-V CABG 01/24/15 (LIMA-LAD, VG- OM1, VG-OM2, VG-PDA)    Cancer (HCC)    skin, melanoma   Carotid artery disease ([place])    a. [place] 8/16: 1-39% bilateral ICA stenosis   Cataract    removed   Diastolic dysfunction    a. TTE 8/16: EF 55-60%, no RWMA, Gr1DD, calcified mitral annulus, mild biatrial enlargement   Erectile dysfunction    GERD (gastroesophageal reflux disease)    History of elbow surgery    History of hiatal hernia    HLD (hyperlipidemia)    HTN (hypertension)    Inferior myocardial infarction ([facility]) 03/2000   stent RCA   Lung cancer ([place])    Postoperative wound infection 02/02/2015   Reflux esophagitis    Sleep apnea 2017   CPAP at night    SURGICAL HISTORY: Past Surgical History:  Procedure Laterality Date   arm surgery  2010   BROW LIFT Bilateral 11/25/2019   Procedure: BROW PTOSIS REPAIR BILATERAL;  Surgeon: Karle Starch, MD;  Location: [facility];  Service: Ophthalmology;  Laterality: Bilateral;  sleep apnea   CARDIAC CATHETERIZATION  06/24/2011   CARDIAC CATHETERIZATION N/A 01/18/2015   Procedure: Left Heart Cath with coronary angiography;  Surgeon: Minna Merritts, MD;  Location: [facility];  Service: Cardiovascular;  Laterality: N/A;   CARDIAC CATHETERIZATION N/A 01/18/2015   Procedure: Intravascular Pressure Wire/FFR Study;  Surgeon: [name], MD;  Location: [facility];  Service: Cardiovascular;  Laterality: N/A;   CAROTID STENT  03/10/2011   COLONOSCOPY  2010   COLONOSCOPY  06/14/2014   Dr Hilarie Fredrickson   COLONOSCOPY  01/2022   done for colon thickening at splenic flexure - 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[name])   CORONARY  ARTERY BYPASS GRAFT N/A 01/24/2015   Procedure: CORONARY ARTERY BYPASS GRAFTING x 4 (LIMA-LAD, SVG-Int 1- Int 2, SVG-PD) ENDOSCOPIC GREATER SAPHENOUS VEIN HARVEST LEFT LEG;  Surgeon: [name], MD;  Location: [facility];  Service: Open Heart Surgery;  Laterality: N/A;   EMBOLECTOMY  06/15/2019   Procedure: EMBOLECTOMY;  Surgeon: Katha Cabal, MD;  Location: [facility];  Service: Vascular;;  right superficial femoral artery   ENDARTERECTOMY FEMORAL Right 06/15/2019   Procedure: ENDARTERECTOMY FEMORAL;  Surgeon: Katha Cabal, MD;  Location: [facility];  Service: Vascular;  Laterality: Right;  common femoral profunda femoris superficial femoral   ESOPHAGOGASTRODUODENOSCOPY (EGD) WITH PROPOFOL N/A 04/24/2016   Procedure: ESOPHAGOGASTRODUODENOSCOPY (EGD) WITH PROPOFOL;  Surgeon: Jerene Bears, MD;  Location: [facility];  Service: Gastroenterology;  Laterality: N/A;   EYE SURGERY     lasik 15 yrs. ago, cataracts removed - both eyes    HAMMER TOE SURGERY     right toe   INSERTION OF ILIAC STENT Right 06/15/2019   Procedure: INSERTION OF ILIAC STENT ( STENTING OF SFA/POP ARTERY );  Surgeon: Katha Cabal, MD;  Location: [facility];  Service: Vascular;  Laterality: Right;  angioplpasty and stent placement: right superficial femoral right  tibiopopliteal trunk bilateral common iliac arteries   IR IMAGING GUIDED PORT INSERTION  11/09/2020   LEFT HEART CATH AND CORONARY ANGIOGRAPHY Left 06/10/2017   Procedure: LEFT HEART CATH AND CORONARY ANGIOGRAPHY;  Surgeon: Minna Merritts, MD;  Location: [facility];  Service: Cardiovascular;  Laterality: Left;   LOWER EXTREMITY ANGIOGRAPHY Left 01/04/2019   Procedure: LOWER EXTREMITY ANGIOGRAPHY;  Surgeon: Katha Cabal, MD;  Location: [facility];  Service: Cardiovascular;  Laterality: Left;   LOWER EXTREMITY ANGIOGRAPHY Right 01/25/2019   Procedure: LOWER EXTREMITY ANGIOGRAPHY;  Surgeon: Katha Cabal, MD;  Location:  [facility];  Service: Cardiovascular;  Laterality: Right;   LOWER EXTREMITY ANGIOGRAPHY Right 01/15/2021   LOWER EXTREMITY ANGIOGRAPHY and stent placement to R SFA and popliteal artery Delana Meyer, Dolores Lory, MD)   NASAL SINUS SURGERY  2008   septpolasty, bilateral turbinate reduction   SHOULDER ARTHROSCOPY  2012   TEE WITHOUT CARDIOVERSION N/A 01/24/2015   Procedure: TRANSESOPHAGEAL ECHOCARDIOGRAM (TEE);  Surgeon: [name], MD;  Location: [facility];  Service: Open Heart Surgery;  Laterality: N/A;   TOE SURGERY  1994   UPPER GI ENDOSCOPY  07/2014, 04-24-16   Dr Raquel James   WRIST SURGERY  2011    SOCIAL HISTORY: Social History   Socioeconomic History   Marital status: Single    Spouse name: Not on file   Number of children: Not on file   Years of education: 12   Highest education level: Not on file  Occupational History   Occupation: retired    Comment: [employer]  Tobacco Use   Smoking status: Former    Packs/day: 2.50    Years: 40.00    Total pack years: 100.00    Types: Cigarettes    Quit date: 03/24/2000    Years since quitting: 22.1   Smokeless tobacco: Never  Vaping Use   Vaping Use: Never used  Substance and Sexual Activity   Alcohol use: Yes    Comment: occassionally   Drug use: No   Sexual activity: Yes  Other Topics Concern   Not on file  Social History Narrative   Singled; lives with son and dog    Occ: retired, back part time at [employer];    Activity: gym 4-5d/wk   Diet: good water, fruits/vegetables daily   Caffeine Use-yes      H/o Armed forces logistics/support/administrative officer   ------------------------------------       Leisure centre manager- retd; [employer] store- retd; quit smoking 2001. Alcohol couple nights a week. Live in [place]. Daughter lives 10 mins.    Social Determinants of Health   Financial Resource Strain: Low Risk  (09/26/2021)   Overall Financial Resource Strain (CARDIA)    Difficulty of Paying Living Expenses: Not hard at all  Food Insecurity: No Food  Insecurity (02/14/2022)   Hunger Vital Sign    Worried About Running Out of Food in the Last Year: Never true    Ran Out of Food in the Last Year: Never true  Transportation Needs: No Transportation Needs (02/14/2022)   PRAPARE - Hydrologist (Medical): No    Lack of Transportation (Non-Medical): No  Physical Activity: Sufficiently Active (09/26/2021)   Exercise Vital Sign    Days of Exercise per Week: 5 days    Minutes of Exercise per Session: 30 min  Stress: No Stress Concern Present (09/26/2021)   [place]    Feeling of  Stress : Only a little  Social Connections: Socially Isolated (09/26/2021)   Social Connection and Isolation Panel [NHANES]    Frequency of Communication with Friends and Family: Twice a week    Frequency of Social Gatherings with Friends and Family: Twice a week    Attends Religious Services: Never    Marine scientist or Organizations: Not on file    Attends Archivist Meetings: Never    Marital Status: Never married  Intimate Partner Violence: Not At Risk (02/14/2022)   Humiliation, Afraid, Rape, and Kick questionnaire    Fear of Current or Ex-Partner: No    Emotionally Abused: No    Physically Abused: No    Sexually Abused: No    FAMILY HISTORY: Family History  Problem Relation Age of Onset   Hypertension Mother    Heart disease Mother    Hypertension Father    Diabetes Father    Lymphoma Sister    Heart disease Brother 36   Cancer Paternal Grandfather    Colon cancer Neg Hx    Prostate cancer Neg Hx    Bladder Cancer Neg Hx    Kidney cancer Neg Hx    Colon polyps Neg Hx    Esophageal cancer Neg Hx    Pancreatic cancer Neg Hx    Stomach cancer Neg Hx     ALLERGIES:  has No Known Allergies.  MEDICATIONS:  Current Outpatient Medications  Medication Sig Dispense Refill   acetaminophen (TYLENOL) 325 MG tablet Take 650 mg by mouth as needed  for moderate pain or mild pain.     albuterol (VENTOLIN HFA) 108 (90 Base) MCG/ACT inhaler Inhale 2 puffs into the lungs every 6 (six) hours as needed for wheezing or shortness of breath. 18 g 2   Ascorbic Acid (VITAMIN C) 1000 MG tablet Take 1 tablet (1,000 mg total) by mouth daily.     aspirin 81 MG EC tablet Take 81 mg by mouth daily.       Calcium-Magnesium-Vitamin D (CALCIUM 1200+D3 PO) Take 1 tablet by mouth daily.     Carboxymethylcellul-Glycerin (LUBRICATING EYE DROPS OP) Place 1 drop into both eyes daily as needed (irritation). (Patient not taking: Reported on 04/18/2022)     cetirizine (ZYRTEC) 10 MG chewable tablet Chew 10 mg by mouth daily.     Cholecalciferol (VITAMIN D3) 50 MCG (2000 UT) TABS Take 2,000 Units by mouth daily. 30 tablet    Cyanocobalamin (B-12) 5000 MCG CAPS Take 5,000 mcg by mouth daily.     docusate sodium (COLACE) 100 MG capsule Take 200 mg by mouth at bedtime.     finasteride (PROSCAR) 5 MG tablet Take 1 tablet (5 mg total) by mouth daily. 30 tablet 6   fluticasone (FLONASE) 50 MCG/ACT nasal spray Place 2 sprays into both nostrils daily.     guaiFENesin (MUCINEX) 600 MG 12 hr tablet Take by mouth 2 (two) times daily.     lidocaine-prilocaine (EMLA) cream Apply 30 -45 mins prior to port access. 30 g 3   Melatonin 10 MG CAPS Take 10 mg by mouth at bedtime as needed (sleep).     nitroGLYCERIN (NITROSTAT) 0.4 MG SL tablet Place 1 tablet (0.4 mg total) under the tongue every 5 (five) minutes as needed for chest pain. 25 tablet 3   olopatadine (PATANOL) 0.1 % ophthalmic solution Place 1 drop into both eyes 2 (two) times daily. (Patient not taking: Reported on 04/18/2022) 5 mL 12   pantoprazole (PROTONIX) 40 MG tablet  Take 1 tablet (40 mg total) by mouth daily. 90 tablet 1   predniSONE (DELTASONE) 50 MG tablet Take 1 tablet (50 mg total) by mouth daily with breakfast. 7 tablet 0   prochlorperazine (COMPAZINE) 10 MG tablet Take 1 tablet (10 mg total) by mouth every 6 (six)  hours as needed for nausea or vomiting. (Patient not taking: Reported on 04/18/2022) 40 tablet 1   simvastatin (ZOCOR) 40 MG tablet Take 1 tablet (40 mg total) by mouth at bedtime. 90 tablet 3   traMADol (ULTRAM) 50 MG tablet TAKE 1 TABLET BY MOUTH 3 TIMES DAILY AS NEEDED FOR MODERATE PAIN (Patient not taking: Reported on 04/18/2022) 90 tablet 0   valsartan (DIOVAN) 80 MG tablet Take 0.5 tablets (40 mg total) by mouth daily. One daily     Vitamin E 450 MG (1000 UT) CAPS Take 450 Units by mouth daily.     Zinc 50 MG TABS Take 50 mg by mouth daily.     No current facility-administered medications for this visit.   Facility-Administered Medications Ordered in Other Visits  Medication Dose Route Frequency Provider Last Rate Last Admin   heparin lock flush 100 UNIT/ML injection               .  PHYSICAL EXAMINATION: ECOG PERFORMANCE STATUS: 0 - Asymptomatic  Vitals:   05/09/22 1043  BP: 107/65  Pulse: 93  Resp: 18      Filed Weights   05/09/22 1043  Weight: 216 lb (98 kg)        Physical Exam HENT:     Head: Normocephalic and atraumatic.     Mouth/Throat:     Pharynx: No oropharyngeal exudate.  Eyes:     Pupils: Pupils are equal, round, and reactive to light.  Cardiovascular:     Rate and Rhythm: Normal rate and regular rhythm.  Pulmonary:     Effort: No respiratory distress.     Breath sounds: No wheezing.     Comments: Decreased air entry bilaterally. Abdominal:     General: Bowel sounds are normal. There is no distension.     Palpations: Abdomen is soft. There is no mass.     Tenderness: There is no abdominal tenderness. There is no guarding or rebound.  Musculoskeletal:        General: No tenderness. Normal range of motion.     Cervical back: Normal range of motion and neck supple.  Skin:    General: Skin is warm.  Neurological:     Mental Status: He is alert and oriented to person, place, and time.  Psychiatric:        Mood and Affect: Affect normal.       LABORATORY DATA:  I have reviewed the data as listed Lab Results  Component Value Date   WBC 6.0 05/09/2022   HGB 14.9 05/09/2022   HCT 43.2 05/09/2022   MCV 93.7 05/09/2022   PLT 167 05/09/2022   Recent Labs    02/14/22 0532 02/17/22 1152 04/04/22 0852 05/09/22 1022  NA 133* 134* 139 138  K 4.0 4.3 4.0 3.6  CL 102 100 108 106  CO2 23 26 24 23   GLUCOSE 118* 113* 103* 137*  BUN 22 25* 14 16  CREATININE 1.05 1.41 0.90 0.94  CALCIUM 9.1 9.9 9.2 9.2  GFRNONAA >60  --  >60 >60  PROT  --  6.7 7.1 6.9  ALBUMIN  --  3.9 4.1 3.9  AST  --  14 18 19  ALT  --  16 14 21   ALKPHOS  --  46 57 56  BILITOT  --  1.2 0.6 0.7    RADIOGRAPHIC STUDIES: I have personally reviewed the radiological images as listed and agreed with the findings in the report. MR BRAIN W WO CONTRAST  Result Date: 05/06/2022 CLINICAL DATA:  Follow-up purposes encephalitis EXAM: MRI HEAD WITHOUT AND WITH CONTRAST TECHNIQUE: Multiplanar, multiecho pulse sequences of the brain and surrounding structures were obtained without and with intravenous contrast. CONTRAST:  32mL GADAVIST GADOBUTROL 1 MMOL/ML IV SOLN COMPARISON:  CT head 02/20/2022, MR head 02/11/2022. The report from an outside hospital brain MRI from 02/22/2022 is also available in care everywhere. FINDINGS: Brain: There is abnormal FLAIR signal abnormality in the medial temporal lobe, hippocampus, and extending into the occipital and parietal lobes with developing encephalomalacia in the occipital lobe. There is curvilinear diffusion restriction in the occipital lobe. Small foci of SWI signal dropout in the occipital lobe are consistent with blood products. There is a small focus of enhancement along the cortex of the affected medial occipital lobe (18-63). Additional curvilinear enhancement in this region is likely vascular. Findings are new since the study from 02/11/2022 and likely reflecting evolving sequela of HSV encephalitis. There is no other  abnormal diffusion restriction. There is no other evidence of intracranial hemorrhage or extra-axial fluid collection. Background parenchymal volume is normal. The ventricles are normal in size. Patchy FLAIR signal abnormality throughout the remainder of the supratentorial white matter likely reflects sequela of chronic small-vessel ischemic change There is no solid mass lesion or other abnormal enhancement. There is no mass effect or midline shift. Vascular: Normal flow voids. Skull and upper cervical spine: Normal marrow signal. Sinuses/Orbits: The paranasal sinuses are clear. Bilateral lens implants are in place. The globes and orbits are otherwise unremarkable. Other: None. IMPRESSION: 1. FLAIR signal abnormality in the medial temporal lobe extending to the hippocampus and parietal and occipital lobes consistent with sequela of HSV encephalitis, new since 02/11/2022 (the outside hospital MRI from 02/22/2022 is not available for direct comparison). There is evolving encephalomalacia in the parietal and occipital lobes as well as a small amount of blood products in the occipital lobe. Only a single punctate focus of enhancement remains in the occipital lobe. 2. No new acute intracranial pathology. Electronically Signed   By: Valetta Mole M.D.   On: 05/06/2022 11:40     ASSESSMENT & PLAN:   Cancer of lower lobe of left lung ([place]) # LLL- Lung cancer-non-small cell stage IV [right suprarenal nodule-s/p biopsy "non-small cell ca"].  S/p  CarboTaxol plus Keytruda x4; currently on single agent Keytruda-; CTA scan- SEP 25th, 2023- [hospital]-  No evidence of pulmonary embolism; The treated lesion in the superior segment of the left lower lobe appears stable compared to the prior study. No new suspicious appearing pulmonary nodules or lymphadenopathy noted elsewhere in the thorax.   # Last [name] was July, 27th, 2023- Recommend holding Keytruda at this time given the absence of any progressive disease; multiple  admissions in the hospital/infection delirium etc.  Question related to [place] ? HSV-2 encephalitis.  We will consider giving the patient a break from therapy/as there is no evidence of improvement in disease on imaging and also given patient's poor tolerance.     #Frequent mental status status changes-?  Delirium from infection versus others.; MRI Brain SEP 2023 [hospital]-no obvious progression of malignancy.  However [facility]- OCT 2023-HSV encephalitis s/p treatment.  Given ongoing delirium- recommend further  evaluation with Dr. Mickeal Skinner.  Discussed with Dr. Mickeal Skinner; currently agrees to evaluate the patient in 2 weeks from now.   # Acute on chronic back pain- BIL radiating lower extremitie- s/p evaluation [[place]]; JUNE 2023 MRI-no cancer; s/p injection.  on tramadol 50 mg BID prn.  on PT- STABLE.  # CAD-s/p CABG-/PVD-s/p stent [Dr.Schneir]-improvement in pain noted.  On Plavix --STABLE.  # IV access: Mediport functioning.  # DISPOSITION: # follow up in 2 months with MD; port/ labs-cbc/cmp;TSH- No chemo; CT CAP- - Dr.B           All  questions were answered. The patient knows to call the clinic with any problems, questions or concerns.    Cammie Sickle, MD 05/09/2022 12:43 PM

## 2022-05-12 DIAGNOSIS — I6523 Occlusion and stenosis of bilateral carotid arteries: Secondary | ICD-10-CM | POA: Diagnosis not present

## 2022-05-12 DIAGNOSIS — Z791 Long term (current) use of non-steroidal anti-inflammatories (NSAID): Secondary | ICD-10-CM | POA: Diagnosis not present

## 2022-05-12 DIAGNOSIS — J849 Interstitial pulmonary disease, unspecified: Secondary | ICD-10-CM | POA: Diagnosis not present

## 2022-05-12 DIAGNOSIS — Z9181 History of falling: Secondary | ICD-10-CM | POA: Diagnosis not present

## 2022-05-12 DIAGNOSIS — I70229 Atherosclerosis of native arteries of extremities with rest pain, unspecified extremity: Secondary | ICD-10-CM | POA: Diagnosis not present

## 2022-05-12 DIAGNOSIS — G929 Unspecified toxic encephalopathy: Secondary | ICD-10-CM | POA: Diagnosis not present

## 2022-05-12 DIAGNOSIS — G8929 Other chronic pain: Secondary | ICD-10-CM | POA: Diagnosis not present

## 2022-05-12 DIAGNOSIS — C3432 Malignant neoplasm of lower lobe, left bronchus or lung: Secondary | ICD-10-CM | POA: Diagnosis not present

## 2022-05-12 DIAGNOSIS — N529 Male erectile dysfunction, unspecified: Secondary | ICD-10-CM | POA: Diagnosis not present

## 2022-05-12 DIAGNOSIS — Z79899 Other long term (current) drug therapy: Secondary | ICD-10-CM | POA: Diagnosis not present

## 2022-05-12 DIAGNOSIS — R0981 Nasal congestion: Secondary | ICD-10-CM | POA: Diagnosis not present

## 2022-05-12 DIAGNOSIS — Z7982 Long term (current) use of aspirin: Secondary | ICD-10-CM | POA: Diagnosis not present

## 2022-05-12 DIAGNOSIS — N400 Enlarged prostate without lower urinary tract symptoms: Secondary | ICD-10-CM | POA: Diagnosis not present

## 2022-05-12 DIAGNOSIS — H9190 Unspecified hearing loss, unspecified ear: Secondary | ICD-10-CM | POA: Diagnosis not present

## 2022-05-12 DIAGNOSIS — I10 Essential (primary) hypertension: Secondary | ICD-10-CM | POA: Diagnosis not present

## 2022-05-12 DIAGNOSIS — B1009 Other human herpesvirus encephalitis: Secondary | ICD-10-CM | POA: Diagnosis not present

## 2022-05-14 DIAGNOSIS — R0981 Nasal congestion: Secondary | ICD-10-CM | POA: Diagnosis not present

## 2022-05-14 DIAGNOSIS — G8929 Other chronic pain: Secondary | ICD-10-CM | POA: Diagnosis not present

## 2022-05-14 DIAGNOSIS — Z9181 History of falling: Secondary | ICD-10-CM | POA: Diagnosis not present

## 2022-05-14 DIAGNOSIS — B1009 Other human herpesvirus encephalitis: Secondary | ICD-10-CM | POA: Diagnosis not present

## 2022-05-14 DIAGNOSIS — Z79899 Other long term (current) drug therapy: Secondary | ICD-10-CM | POA: Diagnosis not present

## 2022-05-14 DIAGNOSIS — G929 Unspecified toxic encephalopathy: Secondary | ICD-10-CM | POA: Diagnosis not present

## 2022-05-14 DIAGNOSIS — N400 Enlarged prostate without lower urinary tract symptoms: Secondary | ICD-10-CM | POA: Diagnosis not present

## 2022-05-14 DIAGNOSIS — I70229 Atherosclerosis of native arteries of extremities with rest pain, unspecified extremity: Secondary | ICD-10-CM | POA: Diagnosis not present

## 2022-05-14 DIAGNOSIS — C3432 Malignant neoplasm of lower lobe, left bronchus or lung: Secondary | ICD-10-CM | POA: Diagnosis not present

## 2022-05-14 DIAGNOSIS — H9190 Unspecified hearing loss, unspecified ear: Secondary | ICD-10-CM | POA: Diagnosis not present

## 2022-05-14 DIAGNOSIS — Z791 Long term (current) use of non-steroidal anti-inflammatories (NSAID): Secondary | ICD-10-CM | POA: Diagnosis not present

## 2022-05-14 DIAGNOSIS — N529 Male erectile dysfunction, unspecified: Secondary | ICD-10-CM | POA: Diagnosis not present

## 2022-05-14 DIAGNOSIS — I6523 Occlusion and stenosis of bilateral carotid arteries: Secondary | ICD-10-CM | POA: Diagnosis not present

## 2022-05-14 DIAGNOSIS — J849 Interstitial pulmonary disease, unspecified: Secondary | ICD-10-CM | POA: Diagnosis not present

## 2022-05-14 DIAGNOSIS — Z7982 Long term (current) use of aspirin: Secondary | ICD-10-CM | POA: Diagnosis not present

## 2022-05-14 DIAGNOSIS — I10 Essential (primary) hypertension: Secondary | ICD-10-CM | POA: Diagnosis not present

## 2022-06-05 ENCOUNTER — Encounter: Payer: Self-pay | Admitting: Internal Medicine

## 2022-06-05 ENCOUNTER — Ambulatory Visit (INDEPENDENT_AMBULATORY_CARE_PROVIDER_SITE_OTHER): Payer: PPO | Admitting: Internal Medicine

## 2022-06-05 VITALS — BP 124/76 | HR 64 | Temp 97.9°F | Ht 68.0 in | Wt 217.0 lb

## 2022-06-05 DIAGNOSIS — J0110 Acute frontal sinusitis, unspecified: Secondary | ICD-10-CM | POA: Insufficient documentation

## 2022-06-05 MED ORDER — BENZONATATE 200 MG PO CAPS: 200.0000 mg | ORAL_CAPSULE | Freq: Three times a day (TID) | ORAL | 0 refills | Status: DC | PRN

## 2022-06-05 MED ORDER — AMOXICILLIN-POT CLAVULANATE 875-125 MG PO TABS: 1.0000 | ORAL_TABLET | Freq: Two times a day (BID) | ORAL | 0 refills | Status: DC

## 2022-06-05 NOTE — Progress Notes (Signed)
Subjective:    Patient ID: Randall Lofts., male    DOB: 1940-09-03, 81 y.o.   MRN: [id number]  HPI Here with daughter due to respiratory illness  Has been congested for 4 days or so Causing cough--productive of yellow (seems to be from head with post nasal drip) No fever, chills or sweats No SOB No sore throat or ear pain Is prone to sinus problems Daughter noticed some wheezing at first--he hasn't noticed Frontal headache  Using mucinex, ibuprofen and cough med at night (no problems sleeping)  Current Outpatient Medications on File Prior to Visit  Medication Sig Dispense Refill   acetaminophen (TYLENOL) 325 MG tablet Take 650 mg by mouth as needed for moderate pain or mild pain.     albuterol (VENTOLIN HFA) 108 (90 Base) MCG/ACT inhaler Inhale 2 puffs into the lungs every 6 (six) hours as needed for wheezing or shortness of breath. 18 g 2   Ascorbic Acid (VITAMIN C) 1000 MG tablet Take 1 tablet (1,000 mg total) by mouth daily.     aspirin 81 MG EC tablet Take 81 mg by mouth daily.       Calcium-Magnesium-Vitamin D (CALCIUM 1200+D3 PO) Take 1 tablet by mouth daily.     Carboxymethylcellul-Glycerin (LUBRICATING EYE DROPS OP) Place 1 drop into both eyes daily as needed (irritation).     cetirizine (ZYRTEC) 10 MG chewable tablet Chew 10 mg by mouth daily.     Cholecalciferol (VITAMIN D3) 50 MCG (2000 UT) TABS Take 2,000 Units by mouth daily. 30 tablet    Cyanocobalamin (B-12) 5000 MCG CAPS Take 5,000 mcg by mouth daily.     docusate sodium (COLACE) 100 MG capsule Take 200 mg by mouth at bedtime.     finasteride (PROSCAR) 5 MG tablet Take 1 tablet (5 mg total) by mouth daily. 30 tablet 6   fluticasone (FLONASE) 50 MCG/ACT nasal spray Place 2 sprays into both nostrils daily.     guaiFENesin (MUCINEX) 600 MG 12 hr tablet Take by mouth 2 (two) times daily.     lidocaine-prilocaine (EMLA) cream Apply 30 -45 mins prior to port access. 30 g 3   Melatonin 10 MG CAPS Take 10 mg by mouth  at bedtime as needed (sleep).     nitroGLYCERIN (NITROSTAT) 0.4 MG SL tablet Place 1 tablet (0.4 mg total) under the tongue every 5 (five) minutes as needed for chest pain. 25 tablet 3   pantoprazole (PROTONIX) 40 MG tablet Take 1 tablet (40 mg total) by mouth daily. 90 tablet 1   simvastatin (ZOCOR) 40 MG tablet Take 1 tablet (40 mg total) by mouth at bedtime. 90 tablet 3   valsartan (DIOVAN) 80 MG tablet Take 0.5 tablets (40 mg total) by mouth daily. One daily     Vitamin E 450 MG (1000 UT) CAPS Take 450 Units by mouth daily.     Zinc 50 MG TABS Take 50 mg by mouth daily.     olopatadine (PATANOL) 0.1 % ophthalmic solution Place 1 drop into both eyes 2 (two) times daily. (Patient not taking: Reported on 06/05/2022) 5 mL 12   predniSONE (DELTASONE) 50 MG tablet Take 1 tablet (50 mg total) by mouth daily with breakfast. (Patient not taking: Reported on 06/05/2022) 7 tablet 0   prochlorperazine (COMPAZINE) 10 MG tablet Take 1 tablet (10 mg total) by mouth every 6 (six) hours as needed for nausea or vomiting. (Patient not taking: Reported on 06/05/2022) 40 tablet 1   traMADol (ULTRAM) 50  MG tablet TAKE 1 TABLET BY MOUTH 3 TIMES DAILY AS NEEDED FOR MODERATE PAIN (Patient not taking: Reported on 06/05/2022) 90 tablet 0   Current Facility-Administered Medications on File Prior to Visit  Medication Dose Route Frequency Provider Last Rate Last Admin   heparin lock flush 100 UNIT/ML injection             No Known Allergies  Past Medical History:  Diagnosis Date   Allergy    seasonal   Arthritis    all over- in general    CAD (coronary artery disease)    a. inferior wall MI 10/01 s/p PCI/DES to RCA; b. Myoview 3/16 neg for ischemia; c. LHC 8/16: ostLAD 80%, OM1 70%, OM2 70% x 2 lesions, mRCA 30%, dRCA 70% s/p 4-V CABG 01/24/15 (LIMA-LAD, VG- OM1, VG-OM2, VG-PDA)    Cancer (HCC)    skin, melanoma   Carotid artery disease ([place])    a. [place] 8/16: 1-39% bilateral ICA stenosis   Cataract    removed    Diastolic dysfunction    a. TTE 8/16: EF 55-60%, no RWMA, Gr1DD, calcified mitral annulus, mild biatrial enlargement   Erectile dysfunction    GERD (gastroesophageal reflux disease)    History of elbow surgery    History of hiatal hernia    HLD (hyperlipidemia)    HTN (hypertension)    Inferior myocardial infarction ([place]) 03/2000   stent RCA   Lung cancer ([place])    Postoperative wound infection 02/02/2015   Reflux esophagitis    Sleep apnea 2017   CPAP at night    Past Surgical History:  Procedure Laterality Date   arm surgery  2010   BROW LIFT Bilateral 11/25/2019   Procedure: BROW PTOSIS REPAIR BILATERAL;  Surgeon: Karle Starch, MD;  Location: [facility];  Service: Ophthalmology;  Laterality: Bilateral;  sleep apnea   CARDIAC CATHETERIZATION  06/24/2011   CARDIAC CATHETERIZATION N/A 01/18/2015   Procedure: Left Heart Cath with coronary angiography;  Surgeon: Minna Merritts, MD;  Location: [facility];  Service: Cardiovascular;  Laterality: N/A;   CARDIAC CATHETERIZATION N/A 01/18/2015   Procedure: Intravascular Pressure Wire/FFR Study;  Surgeon: [name], MD;  Location: [facility];  Service: Cardiovascular;  Laterality: N/A;   CAROTID STENT  03/10/2011   COLONOSCOPY  2010   COLONOSCOPY  06/14/2014   Dr Hilarie Fredrickson   COLONOSCOPY  01/2022   done for colon thickening at splenic flexure - 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[name])   CORONARY ARTERY BYPASS GRAFT N/A 01/24/2015   Procedure: CORONARY ARTERY BYPASS GRAFTING x 4 (LIMA-LAD, SVG-Int 1- Int 2, SVG-PD) ENDOSCOPIC GREATER SAPHENOUS VEIN HARVEST LEFT LEG;  Surgeon: [name], MD;  Location: [facility];  Service: Open Heart Surgery;  Laterality: N/A;   EMBOLECTOMY  06/15/2019   Procedure: EMBOLECTOMY;  Surgeon: Katha Cabal, MD;  Location: [facility];  Service: Vascular;;  right superficial femoral artery   ENDARTERECTOMY FEMORAL Right 06/15/2019   Procedure: ENDARTERECTOMY  FEMORAL;  Surgeon: Katha Cabal, MD;  Location: [facility];  Service: Vascular;  Laterality: Right;  common femoral profunda femoris superficial femoral   ESOPHAGOGASTRODUODENOSCOPY (EGD) WITH PROPOFOL N/A 04/24/2016   Procedure: ESOPHAGOGASTRODUODENOSCOPY (EGD) WITH PROPOFOL;  Surgeon: Jerene Bears, MD;  Location: [facility];  Service: Gastroenterology;  Laterality: N/A;   EYE SURGERY     lasik 15 yrs. ago, cataracts removed - both eyes    HAMMER TOE SURGERY     right toe  INSERTION OF ILIAC STENT Right 06/15/2019   Procedure: INSERTION OF ILIAC STENT ( STENTING OF SFA/POP ARTERY );  Surgeon: Katha Cabal, MD;  Location: [facility];  Service: Vascular;  Laterality: Right;  angioplpasty and stent placement: right superficial femoral right tibiopopliteal trunk bilateral common iliac arteries   IR IMAGING GUIDED PORT INSERTION  11/09/2020   LEFT HEART CATH AND CORONARY ANGIOGRAPHY Left 06/10/2017   Procedure: LEFT HEART CATH AND CORONARY ANGIOGRAPHY;  Surgeon: Minna Merritts, MD;  Location: [facility];  Service: Cardiovascular;  Laterality: Left;   LOWER EXTREMITY ANGIOGRAPHY Left 01/04/2019   Procedure: LOWER EXTREMITY ANGIOGRAPHY;  Surgeon: Katha Cabal, MD;  Location: [facility];  Service: Cardiovascular;  Laterality: Left;   LOWER EXTREMITY ANGIOGRAPHY Right 01/25/2019   Procedure: LOWER EXTREMITY ANGIOGRAPHY;  Surgeon: Katha Cabal, MD;  Location: [facility];  Service: Cardiovascular;  Laterality: Right;   LOWER EXTREMITY ANGIOGRAPHY Right 01/15/2021   LOWER EXTREMITY ANGIOGRAPHY and stent placement to R SFA and popliteal artery Delana Meyer, Dolores Lory, MD)   NASAL SINUS SURGERY  2008   septpolasty, bilateral turbinate reduction   SHOULDER ARTHROSCOPY  2012   TEE WITHOUT CARDIOVERSION N/A 01/24/2015   Procedure: TRANSESOPHAGEAL ECHOCARDIOGRAM (TEE);  Surgeon: [name], MD;  Location: [facility];  Service: Open Heart Surgery;   Laterality: N/A;   TOE SURGERY  1994   UPPER GI ENDOSCOPY  07/2014, 04-24-16   Dr Raquel James   WRIST SURGERY  2011    Family History  Problem Relation Age of Onset   Hypertension Mother    Heart disease Mother    Hypertension Father    Diabetes Father    Lymphoma Sister    Heart disease Brother 60   Cancer Paternal Grandfather    Colon cancer Neg Hx    Prostate cancer Neg Hx    Bladder Cancer Neg Hx    Kidney cancer Neg Hx    Colon polyps Neg Hx    Esophageal cancer Neg Hx    Pancreatic cancer Neg Hx    Stomach cancer Neg Hx     Social History   Socioeconomic History   Marital status: Single    Spouse name: Not on file   Number of children: Not on file   Years of education: 12   Highest education level: Not on file  Occupational History   Occupation: retired    Comment: Agricultural consultant  Tobacco Use   Smoking status: Former    Packs/day: 2.50    Years: 40.00    Total pack years: 100.00    Types: Cigarettes    Quit date: 03/24/2000    Years since quitting: 22.2   Smokeless tobacco: Never  Vaping Use   Vaping Use: Never used  Substance and Sexual Activity   Alcohol use: Yes    Comment: occassionally   Drug use: No   Sexual activity: Yes  Other Topics Concern   Not on file  Social History Narrative   Singled; lives with son and dog    Occ: retired, back part time at [employer];    Activity: gym 4-5d/wk   Diet: good water, fruits/vegetables daily   Caffeine Use-yes      H/o Armed forces logistics/support/administrative officer   ------------------------------------       Leisure centre manager- retd; [employer] store- retd; quit smoking 2001. Alcohol couple nights a week. Live in [place]. Daughter lives 10 mins.    Social Determinants of Radio broadcast assistant  Strain: Low Risk  (09/26/2021)   Overall Financial Resource Strain (CARDIA)    Difficulty of Paying Living Expenses: Not hard at all  Food Insecurity: No Food Insecurity (02/14/2022)   Hunger Vital Sign    Worried About Running Out of Food in the Last  Year: Never true    Ran Out of Food in the Last Year: Never true  Transportation Needs: No Transportation Needs (02/14/2022)   PRAPARE - Hydrologist (Medical): No    Lack of Transportation (Non-Medical): No  Physical Activity: Sufficiently Active (09/26/2021)   Exercise Vital Sign    Days of Exercise per Week: 5 days    Minutes of Exercise per Session: 30 min  Stress: No Stress Concern Present (09/26/2021)   [place]    Feeling of Stress : Only a little  Social Connections: Socially Isolated (09/26/2021)   Social Connection and Isolation Panel [NHANES]    Frequency of Communication with Friends and Family: Twice a week    Frequency of Social Gatherings with Friends and Family: Twice a week    Attends Religious Services: Never    Marine scientist or Organizations: Not on file    Attends Archivist Meetings: Never    Marital Status: Never married  Intimate Partner Violence: Not At Risk (02/14/2022)   Humiliation, Afraid, Rape, and Kick questionnaire    Fear of Current or Ex-Partner: No    Emotionally Abused: No    Physically Abused: No    Sexually Abused: No   Review of Systems No N/V Eating okay     Objective:   Physical Exam Constitutional:      Appearance: Normal appearance.  HENT:     Head:     Comments: No sinus tenderness    Right Ear: Tympanic membrane and ear canal normal.     Left Ear: Tympanic membrane and ear canal normal.     Nose: Congestion present.     Mouth/Throat:     Comments: Slight injection Pulmonary:     Effort: Pulmonary effort is normal.     Breath sounds: Normal breath sounds. No wheezing or rales.  Musculoskeletal:     Cervical back: Neck supple.  Lymphadenopathy:     Cervical: No cervical adenopathy.  Neurological:     Mental Status: He is alert.            Assessment & Plan:

## 2022-06-05 NOTE — Assessment & Plan Note (Signed)
Clearly in sinuses and some chronic issues Continue analgesics Flonase Augmentin 875 bid for 7 days Benzonatate prn for cough

## 2022-06-11 ENCOUNTER — Telehealth: Payer: Self-pay | Admitting: Family Medicine

## 2022-06-11 DIAGNOSIS — G929 Unspecified toxic encephalopathy: Secondary | ICD-10-CM | POA: Diagnosis not present

## 2022-06-11 DIAGNOSIS — J849 Interstitial pulmonary disease, unspecified: Secondary | ICD-10-CM | POA: Diagnosis not present

## 2022-06-11 DIAGNOSIS — Z791 Long term (current) use of non-steroidal anti-inflammatories (NSAID): Secondary | ICD-10-CM | POA: Diagnosis not present

## 2022-06-11 DIAGNOSIS — I10 Essential (primary) hypertension: Secondary | ICD-10-CM | POA: Diagnosis not present

## 2022-06-11 DIAGNOSIS — N400 Enlarged prostate without lower urinary tract symptoms: Secondary | ICD-10-CM | POA: Diagnosis not present

## 2022-06-11 DIAGNOSIS — I70229 Atherosclerosis of native arteries of extremities with rest pain, unspecified extremity: Secondary | ICD-10-CM | POA: Diagnosis not present

## 2022-06-11 DIAGNOSIS — I6523 Occlusion and stenosis of bilateral carotid arteries: Secondary | ICD-10-CM | POA: Diagnosis not present

## 2022-06-11 DIAGNOSIS — R0981 Nasal congestion: Secondary | ICD-10-CM | POA: Diagnosis not present

## 2022-06-11 DIAGNOSIS — Z9181 History of falling: Secondary | ICD-10-CM | POA: Diagnosis not present

## 2022-06-11 DIAGNOSIS — H9190 Unspecified hearing loss, unspecified ear: Secondary | ICD-10-CM | POA: Diagnosis not present

## 2022-06-11 DIAGNOSIS — B1009 Other human herpesvirus encephalitis: Secondary | ICD-10-CM | POA: Diagnosis not present

## 2022-06-11 DIAGNOSIS — C3432 Malignant neoplasm of lower lobe, left bronchus or lung: Secondary | ICD-10-CM | POA: Diagnosis not present

## 2022-06-11 DIAGNOSIS — Z7982 Long term (current) use of aspirin: Secondary | ICD-10-CM | POA: Diagnosis not present

## 2022-06-11 DIAGNOSIS — G8929 Other chronic pain: Secondary | ICD-10-CM | POA: Diagnosis not present

## 2022-06-11 DIAGNOSIS — Z79899 Other long term (current) drug therapy: Secondary | ICD-10-CM | POA: Diagnosis not present

## 2022-06-11 DIAGNOSIS — N529 Male erectile dysfunction, unspecified: Secondary | ICD-10-CM | POA: Diagnosis not present

## 2022-06-11 NOTE — Telephone Encounter (Signed)
Annelisa from [facility] called to let Dr. Darnell Level know they have discharged nursing services for the pt. Annelisa stated PT will continue. Call back # [phone], secured

## 2022-06-11 NOTE — Telephone Encounter (Signed)
FYI, thanks.

## 2022-06-12 ENCOUNTER — Other Ambulatory Visit: Payer: PPO

## 2022-06-12 DIAGNOSIS — Z87891 Personal history of nicotine dependence: Secondary | ICD-10-CM | POA: Diagnosis not present

## 2022-06-12 DIAGNOSIS — Z515 Encounter for palliative care: Secondary | ICD-10-CM | POA: Diagnosis not present

## 2022-06-12 DIAGNOSIS — B004 Herpesviral encephalitis: Secondary | ICD-10-CM | POA: Diagnosis not present

## 2022-06-12 DIAGNOSIS — C3490 Malignant neoplasm of unspecified part of unspecified bronchus or lung: Secondary | ICD-10-CM | POA: Diagnosis not present

## 2022-06-18 DIAGNOSIS — R0981 Nasal congestion: Secondary | ICD-10-CM | POA: Diagnosis not present

## 2022-06-18 DIAGNOSIS — G929 Unspecified toxic encephalopathy: Secondary | ICD-10-CM | POA: Diagnosis not present

## 2022-06-18 DIAGNOSIS — I10 Essential (primary) hypertension: Secondary | ICD-10-CM | POA: Diagnosis not present

## 2022-06-18 DIAGNOSIS — C3432 Malignant neoplasm of lower lobe, left bronchus or lung: Secondary | ICD-10-CM | POA: Diagnosis not present

## 2022-06-18 DIAGNOSIS — N529 Male erectile dysfunction, unspecified: Secondary | ICD-10-CM | POA: Diagnosis not present

## 2022-06-18 DIAGNOSIS — Z7982 Long term (current) use of aspirin: Secondary | ICD-10-CM | POA: Diagnosis not present

## 2022-06-18 DIAGNOSIS — H9190 Unspecified hearing loss, unspecified ear: Secondary | ICD-10-CM | POA: Diagnosis not present

## 2022-06-18 DIAGNOSIS — G8929 Other chronic pain: Secondary | ICD-10-CM | POA: Diagnosis not present

## 2022-06-18 DIAGNOSIS — R4182 Altered mental status, unspecified: Secondary | ICD-10-CM | POA: Diagnosis not present

## 2022-06-18 DIAGNOSIS — J849 Interstitial pulmonary disease, unspecified: Secondary | ICD-10-CM | POA: Diagnosis not present

## 2022-06-18 DIAGNOSIS — N400 Enlarged prostate without lower urinary tract symptoms: Secondary | ICD-10-CM | POA: Diagnosis not present

## 2022-06-18 DIAGNOSIS — I6523 Occlusion and stenosis of bilateral carotid arteries: Secondary | ICD-10-CM | POA: Diagnosis not present

## 2022-06-18 DIAGNOSIS — I70229 Atherosclerosis of native arteries of extremities with rest pain, unspecified extremity: Secondary | ICD-10-CM | POA: Diagnosis not present

## 2022-06-18 DIAGNOSIS — B1009 Other human herpesvirus encephalitis: Secondary | ICD-10-CM | POA: Diagnosis not present

## 2022-06-18 DIAGNOSIS — Z9181 History of falling: Secondary | ICD-10-CM | POA: Diagnosis not present

## 2022-07-15 ENCOUNTER — Ambulatory Visit
Admission: RE | Admit: 2022-07-15 | Discharge: 2022-07-15 | Disposition: A | Discharge status: Home / Self Care | Payer: PPO | Source: Ambulatory Visit | Attending: Internal Medicine | Admitting: Internal Medicine

## 2022-07-15 DIAGNOSIS — C3490 Malignant neoplasm of unspecified part of unspecified bronchus or lung: Secondary | ICD-10-CM | POA: Diagnosis not present

## 2022-07-15 DIAGNOSIS — C3432 Malignant neoplasm of lower lobe, left bronchus or lung: Secondary | ICD-10-CM | POA: Insufficient documentation

## 2022-07-15 DIAGNOSIS — K4020 Bilateral inguinal hernia, without obstruction or gangrene, not specified as recurrent: Secondary | ICD-10-CM | POA: Diagnosis not present

## 2022-07-15 DIAGNOSIS — I700 Atherosclerosis of aorta: Secondary | ICD-10-CM | POA: Diagnosis not present

## 2022-07-15 DIAGNOSIS — J432 Centrilobular emphysema: Secondary | ICD-10-CM | POA: Diagnosis not present

## 2022-07-15 DIAGNOSIS — Z5111 Encounter for antineoplastic chemotherapy: Secondary | ICD-10-CM | POA: Diagnosis not present

## 2022-07-15 MED ORDER — IOHEXOL 300 MG/ML  SOLN
100.0000 mL | Freq: Once | INTRAMUSCULAR | Status: AC | PRN
  Administered 2022-07-15: 100 mL via INTRAVENOUS

## 2022-07-16 DIAGNOSIS — R4182 Altered mental status, unspecified: Secondary | ICD-10-CM | POA: Diagnosis not present

## 2022-07-16 DIAGNOSIS — Z7982 Long term (current) use of aspirin: Secondary | ICD-10-CM | POA: Diagnosis not present

## 2022-07-16 DIAGNOSIS — I70229 Atherosclerosis of native arteries of extremities with rest pain, unspecified extremity: Secondary | ICD-10-CM | POA: Diagnosis not present

## 2022-07-16 DIAGNOSIS — G929 Unspecified toxic encephalopathy: Secondary | ICD-10-CM | POA: Diagnosis not present

## 2022-07-16 DIAGNOSIS — I10 Essential (primary) hypertension: Secondary | ICD-10-CM | POA: Diagnosis not present

## 2022-07-16 DIAGNOSIS — H9190 Unspecified hearing loss, unspecified ear: Secondary | ICD-10-CM | POA: Diagnosis not present

## 2022-07-16 DIAGNOSIS — R0981 Nasal congestion: Secondary | ICD-10-CM | POA: Diagnosis not present

## 2022-07-16 DIAGNOSIS — N529 Male erectile dysfunction, unspecified: Secondary | ICD-10-CM | POA: Diagnosis not present

## 2022-07-16 DIAGNOSIS — N400 Enlarged prostate without lower urinary tract symptoms: Secondary | ICD-10-CM | POA: Diagnosis not present

## 2022-07-16 DIAGNOSIS — C3432 Malignant neoplasm of lower lobe, left bronchus or lung: Secondary | ICD-10-CM | POA: Diagnosis not present

## 2022-07-16 DIAGNOSIS — J849 Interstitial pulmonary disease, unspecified: Secondary | ICD-10-CM | POA: Diagnosis not present

## 2022-07-16 DIAGNOSIS — B1009 Other human herpesvirus encephalitis: Secondary | ICD-10-CM | POA: Diagnosis not present

## 2022-07-16 DIAGNOSIS — Z9181 History of falling: Secondary | ICD-10-CM | POA: Diagnosis not present

## 2022-07-16 DIAGNOSIS — G8929 Other chronic pain: Secondary | ICD-10-CM | POA: Diagnosis not present

## 2022-07-16 DIAGNOSIS — I6523 Occlusion and stenosis of bilateral carotid arteries: Secondary | ICD-10-CM | POA: Diagnosis not present

## 2022-07-18 ENCOUNTER — Encounter: Payer: Self-pay | Admitting: Internal Medicine

## 2022-07-18 ENCOUNTER — Inpatient Hospital Stay (HOSPITAL_BASED_OUTPATIENT_CLINIC_OR_DEPARTMENT_OTHER): Payer: PPO | Admitting: Internal Medicine

## 2022-07-18 ENCOUNTER — Inpatient Hospital Stay: Payer: PPO | Attending: Internal Medicine

## 2022-07-18 VITALS — BP 107/55 | HR 72 | Temp 97.8°F | Resp 16 | Wt 212.6 lb

## 2022-07-18 DIAGNOSIS — B004 Herpesviral encephalitis: Secondary | ICD-10-CM | POA: Insufficient documentation

## 2022-07-18 DIAGNOSIS — I2510 Atherosclerotic heart disease of native coronary artery without angina pectoris: Secondary | ICD-10-CM | POA: Diagnosis not present

## 2022-07-18 DIAGNOSIS — Z87891 Personal history of nicotine dependence: Secondary | ICD-10-CM | POA: Diagnosis not present

## 2022-07-18 DIAGNOSIS — Z7982 Long term (current) use of aspirin: Secondary | ICD-10-CM | POA: Insufficient documentation

## 2022-07-18 DIAGNOSIS — Z951 Presence of aortocoronary bypass graft: Secondary | ICD-10-CM | POA: Insufficient documentation

## 2022-07-18 DIAGNOSIS — Z9221 Personal history of antineoplastic chemotherapy: Secondary | ICD-10-CM | POA: Diagnosis not present

## 2022-07-18 DIAGNOSIS — C3432 Malignant neoplasm of lower lobe, left bronchus or lung: Secondary | ICD-10-CM

## 2022-07-18 DIAGNOSIS — Z7902 Long term (current) use of antithrombotics/antiplatelets: Secondary | ICD-10-CM | POA: Insufficient documentation

## 2022-07-18 DIAGNOSIS — Z79899 Other long term (current) drug therapy: Secondary | ICD-10-CM | POA: Diagnosis not present

## 2022-07-18 LAB — COMPREHENSIVE METABOLIC PANEL
ALT: 21 U/L (ref 0–44)
AST: 20 U/L (ref 15–41)
Albumin: 4.2 g/dL (ref 3.5–5.0)
Alkaline Phosphatase: 57 U/L (ref 38–126)
Anion gap: 10 (ref 5–15)
BUN: 19 mg/dL (ref 8–23)
CO2: 24 mmol/L (ref 22–32)
Calcium: 9.3 mg/dL (ref 8.9–10.3)
Chloride: 103 mmol/L (ref 98–111)
Creatinine, Ser: 1.22 mg/dL (ref 0.61–1.24)
GFR, Estimated: 60 mL/min — ABNORMAL LOW (ref >60)
Glucose, Bld: 79 mg/dL (ref 70–99)
Potassium: 4.1 mmol/L (ref 3.5–5.1)
Sodium: 137 mmol/L (ref 135–145)
Total Bilirubin: 0.7 mg/dL (ref 0.3–1.2)
Total Protein: 7.1 g/dL (ref 6.5–8.1)

## 2022-07-18 LAB — CBC WITH DIFFERENTIAL/PLATELET
Abs Immature Granulocytes: 0.05 10*3/uL (ref 0.00–0.07)
Basophils Absolute: 0.1 10*3/uL (ref 0.0–0.1)
Basophils Relative: 1 %
Eosinophils Absolute: 0.3 10*3/uL (ref 0.0–0.5)
Eosinophils Relative: 4 %
HCT: 46.6 % (ref 39.0–52.0)
Hemoglobin: 15.8 g/dL (ref 13.0–17.0)
Immature Granulocytes: 1 %
Lymphocytes Relative: 18 %
Lymphs Abs: 1.2 10*3/uL (ref 0.7–4.0)
MCH: 30.6 pg (ref 26.0–34.0)
MCHC: 33.9 g/dL (ref 30.0–36.0)
MCV: 90.1 fL (ref 80.0–100.0)
Monocytes Absolute: 0.7 10*3/uL (ref 0.1–1.0)
Monocytes Relative: 10 %
Neutro Abs: 4.5 10*3/uL (ref 1.7–7.7)
Neutrophils Relative %: 66 %
Platelets: 203 10*3/uL (ref 150–400)
RBC: 5.17 MIL/uL (ref 4.22–5.81)
RDW: 12.9 % (ref 11.5–15.5)
WBC: 6.7 10*3/uL (ref 4.0–10.5)
nRBC: 0 % (ref 0.0–0.2)

## 2022-07-18 LAB — TSH: TSH: 1.562 u[IU]/mL (ref 0.350–4.500)

## 2022-07-18 NOTE — Assessment & Plan Note (Signed)
#   [facility]- Lung cancer-non-small cell stage IV [right suprarenal nodule-s/p biopsy "non-small cell ca"].  S/p  CarboTaxol plus Keytruda x4; currently on single agent Keytruda on HOLD-CT FEB 6th, 2024- No evidence of recurrent or metastatic disease.   # Last [name] was July, 27th, 2023-  Discussed that it is quite possible that patient's encephalitis could be result of immunosuppression from chemotherapy/steroids/and also immunotherapy.  Given overall stable disease on the CT scan- FEB 2024-I think it is okay to hold further therapy at this time.  Continue surveillance imaging in 4 months.  # CT scan FEB 2024- Peripheral and basilar predominant ground-glass and subpleural reticular densities, increased from 01/11/2022. 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 ? Causing fatigue.  Recommend follow up with pulmonary?   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  Likely contributing to patient's cognitive deficits- [Dr.vaslow] stable.   # CAD-s/p CABG-/PVD-s/p stent [Dr.Schneir]-improvement in pain noted.  On Plavix -stable.   # IV access: Mediport functioning.  #Incidental findings on Imaging  CT , 2024: Aortic atherosclerosis; Emphysema I reviewed/discussed/counseled the patient.   # DISPOSITION: # port flush in 2 months # follow up in 4 months with MD; port/ labs-cbc/cmp;TSH- No chemo; CT chest- - Dr.B

## 2022-07-18 NOTE — Progress Notes (Signed)
Randall Ali NOTE  Patient Care Team: Randall Bush, MD as PCP - General (Family Medicine) Randall Situ [name], MD as PCP - Cardiology (Cardiology) Randall Mc, MD (Internal Medicine) Randall Merritts, MD as Consulting Physician (Cardiology) Randall Partridge, DO as Consulting Physician (Neurology) Randall Sickle, MD as Consulting Physician (Hematology and Oncology) Randall Nab, RN as Oncology Nurse Navigator  CHIEF COMPLAINTS/PURPOSE OF CONSULTATION: lung cancer    Oncology History Overview Note  #MAY 2022-Lung cancer-non-small cell [CT guided bx] T3N1 vs stage IV Dr.Hendrickson.  MRI brain negative for malignancy.# 1. April 2022- LLL ~4.0 cm mass in the superior segment left lower lobe abuts the major fissure and the posterior pleural surface without visible chest wall invasion without pleural effusion. There 2-3 other small nodules in the left lower lobe, largest measures 9 mm in diameter. These are suspicious for same lobe satellite lesions and there is left hilar adenopathy. Assuming non-small cell lung cancer the appearance is compatible with T3 N1 M0 disease (stage IIIA).  # right suprarenal nodule-awaiting biopsy on 5/31- non-small cell [revived at tumor conference at [place]  # June 9th 2022- CARBO-TAXOl-KEYTRUDA; Fulphila  # ? Subtle adrenal insufficiency- No acute process - -September MRI brain negative for any pituitary hypophysitis/brain metastasis. Prednisone   MOLECULAR TESTING: NGS TPS-PDL 100%; EXON 12 amplification* mutations.  # CAD [CABG 2016; Dr.Gollan]   Cancer of lower lobe of left lung ([name])  11/02/2020 Initial Diagnosis   Cancer of lower lobe of left lung ([place])   11/02/2020 Cancer Staging   Staging form: Lung, AJCC 8th Edition - Clinical: Stage IVA (cT3, cN1, cM1a) - Signed by Randall Sickle, MD on 11/02/2020   11/16/2020 - 01/02/2022 Chemotherapy   Patient is on Treatment Plan : LUNG NSCLC Carboplatin + Paclitaxel +  Pembrolizumab q21d x 4 cycles / Pembrolizumab Maintenance Q21D     11/16/2020 -  Chemotherapy   Patient is on Treatment Plan : LUNG NSCLC Carboplatin (6) + Paclitaxel (200) + Pembrolizumab (200) D1 q21d x 4 cycles / Pembrolizumab (200) Maintenance D1 q21d      HISTORY OF PRESENTING ILLNESS: Accompanied by daughter.  Walking independently.  Randall Ali. 82 y.o.  male lung cancer-non-small cell stage IV [supra-renal nodule s/p biopsy] currently on Keytruda is for follow-up/review results of the MRI brain.  Randall Ali is on hold since July 2023 because of multiple hospitalizations/HSV encephalitis etc.  Pt in for follow up, continues to be discouraged by continued fatigue and "being tired all the time".  Denies any worsening cough.   However patient continues to have intermittent cognitive deficits.  He is currently living with his daughter in [name].     Review of Systems  Constitutional:  Positive for malaise/fatigue. Negative for chills, diaphoresis, fever and weight loss.  HENT:  Negative for nosebleeds and sore throat.   Eyes:  Negative for double vision.  Respiratory:  Negative for cough, hemoptysis, sputum production, shortness of breath and wheezing.   Cardiovascular:  Negative for chest pain, palpitations, orthopnea and leg swelling.  Gastrointestinal:  Negative for abdominal pain, blood in stool, constipation, diarrhea, heartburn, melena and vomiting.  Genitourinary:  Negative for dysuria, frequency and urgency.  Musculoskeletal:  Positive for back pain and joint pain.  Skin: Negative.  Negative for itching and rash.  Neurological:  Positive for dizziness and tremors. Negative for tingling, focal weakness, weakness and headaches.  Endo/Heme/Allergies:  Does not bruise/bleed easily.  Psychiatric/Behavioral:  Positive for memory loss. Negative for  depression. The patient is not nervous/anxious and does not have insomnia.      MEDICAL HISTORY:  Past Medical History:  Diagnosis  Date   Allergy    seasonal   Arthritis    all over- in general    CAD (coronary artery disease)    a. inferior wall MI 10/01 s/p PCI/DES to RCA; b. Myoview 3/16 neg for ischemia; c. LHC 8/16: ostLAD 80%, OM1 70%, OM2 70% x 2 lesions, mRCA 30%, dRCA 70% s/p 4-V CABG 01/24/15 (LIMA-LAD, VG- OM1, VG-OM2, VG-PDA)    Cancer (HCC)    skin, melanoma   Carotid artery disease ([name])    a. [place] 8/16: 1-39% bilateral ICA stenosis   Cataract    removed   Diastolic dysfunction    a. TTE 8/16: EF 55-60%, no RWMA, Gr1DD, calcified mitral annulus, mild biatrial enlargement   Erectile dysfunction    GERD (gastroesophageal reflux disease)    History of elbow surgery    History of hiatal hernia    HLD (hyperlipidemia)    HTN (hypertension)    Inferior myocardial infarction ([name]) 03/2000   stent RCA   Lung cancer ([name])    Postoperative wound infection 02/02/2015   Reflux esophagitis    Sleep apnea 2017   CPAP at night    SURGICAL HISTORY: Past Surgical History:  Procedure Laterality Date   arm surgery  2010   BROW LIFT Bilateral 11/25/2019   Procedure: BROW PTOSIS REPAIR BILATERAL;  Surgeon: Karle Starch, MD;  Location: [facility];  Service: Ophthalmology;  Laterality: Bilateral;  sleep apnea   CARDIAC CATHETERIZATION  06/24/2011   CARDIAC CATHETERIZATION N/A 01/18/2015   Procedure: Left Heart Cath with coronary angiography;  Surgeon: Randall Merritts, MD;  Location: [facility];  Service: Cardiovascular;  Laterality: N/A;   CARDIAC CATHETERIZATION N/A 01/18/2015   Procedure: Intravascular Pressure Wire/FFR Study;  Surgeon: [name], MD;  Location: [facility];  Service: Cardiovascular;  Laterality: N/A;   CAROTID STENT  03/10/2011   COLONOSCOPY  2010   COLONOSCOPY  06/14/2014   Dr Hilarie Fredrickson   COLONOSCOPY  01/2022   done for colon thickening at splenic flexure - 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[name])   CORONARY ARTERY BYPASS GRAFT N/A 01/24/2015    Procedure: CORONARY ARTERY BYPASS GRAFTING x 4 (LIMA-LAD, SVG-Int 1- Int 2, SVG-PD) ENDOSCOPIC GREATER SAPHENOUS VEIN HARVEST LEFT LEG;  Surgeon: [name], MD;  Location: [name];  Service: Open Heart Surgery;  Laterality: N/A;   EMBOLECTOMY  06/15/2019   Procedure: EMBOLECTOMY;  Surgeon: Katha Cabal, MD;  Location: [facility];  Service: Vascular;;  right superficial femoral artery   ENDARTERECTOMY FEMORAL Right 06/15/2019   Procedure: ENDARTERECTOMY FEMORAL;  Surgeon: Katha Cabal, MD;  Location: [facility];  Service: Vascular;  Laterality: Right;  common femoral profunda femoris superficial femoral   ESOPHAGOGASTRODUODENOSCOPY (EGD) WITH PROPOFOL N/A 04/24/2016   Procedure: ESOPHAGOGASTRODUODENOSCOPY (EGD) WITH PROPOFOL;  Surgeon: Jerene Bears, MD;  Location: [facility];  Service: Gastroenterology;  Laterality: N/A;   EYE SURGERY     lasik 15 yrs. ago, cataracts removed - both eyes    HAMMER TOE SURGERY     right toe   INSERTION OF ILIAC STENT Right 06/15/2019   Procedure: INSERTION OF ILIAC STENT ( STENTING OF SFA/POP ARTERY );  Surgeon: Katha Cabal, MD;  Location: [facility];  Service: Vascular;  Laterality: Right;  angioplpasty and stent placement: right superficial femoral right tibiopopliteal  trunk bilateral common iliac arteries   IR IMAGING GUIDED PORT INSERTION  11/09/2020   LEFT HEART CATH AND CORONARY ANGIOGRAPHY Left 06/10/2017   Procedure: LEFT HEART CATH AND CORONARY ANGIOGRAPHY;  Surgeon: Randall Merritts, MD;  Location: [facility];  Service: Cardiovascular;  Laterality: Left;   LOWER EXTREMITY ANGIOGRAPHY Left 01/04/2019   Procedure: LOWER EXTREMITY ANGIOGRAPHY;  Surgeon: Katha Cabal, MD;  Location: [facility];  Service: Cardiovascular;  Laterality: Left;   LOWER EXTREMITY ANGIOGRAPHY Right 01/25/2019   Procedure: LOWER EXTREMITY ANGIOGRAPHY;  Surgeon: Katha Cabal, MD;  Location: [facility];  Service:  Cardiovascular;  Laterality: Right;   LOWER EXTREMITY ANGIOGRAPHY Right 01/15/2021   LOWER EXTREMITY ANGIOGRAPHY and stent placement to R SFA and popliteal artery Delana Meyer, Dolores Lory, MD)   NASAL SINUS SURGERY  2008   septpolasty, bilateral turbinate reduction   SHOULDER ARTHROSCOPY  2012   TEE WITHOUT CARDIOVERSION N/A 01/24/2015   Procedure: TRANSESOPHAGEAL ECHOCARDIOGRAM (TEE);  Surgeon: [name], MD;  Location: [facility];  Service: Open Heart Surgery;  Laterality: N/A;   TOE SURGERY  1994   UPPER GI ENDOSCOPY  07/2014, 04-24-16   Dr Raquel James   WRIST SURGERY  2011    SOCIAL HISTORY: Social History   Socioeconomic History   Marital status: Single    Spouse name: Not on file   Number of children: Not on file   Years of education: 12   Highest education level: Not on file  Occupational History   Occupation: retired    Comment: [employer]  Tobacco Use   Smoking status: Former    Packs/day: 2.50    Years: 40.00    Total pack years: 100.00    Types: Cigarettes    Quit date: 03/24/2000    Years since quitting: 22.3   Smokeless tobacco: Never  Vaping Use   Vaping Use: Never used  Substance and Sexual Activity   Alcohol use: Yes    Comment: occassionally   Drug use: No   Sexual activity: Yes  Other Topics Concern   Not on file  Social History Narrative   Singled; lives with son and dog    Occ: retired, back part time at [employer];    Activity: gym 4-5d/wk   Diet: good water, fruits/vegetables daily   Caffeine Use-yes      H/o Armed forces logistics/support/administrative officer   ------------------------------------       Leisure centre manager- retd; [employer] store- retd; quit smoking 2001. Alcohol couple nights a week. Live in [place]. Daughter lives 10 mins.    Social Determinants of Health   Financial Resource Strain: Low Risk  (09/26/2021)   Overall Financial Resource Strain (CARDIA)    Difficulty of Paying Living Expenses: Not hard at all  Food Insecurity: No Food Insecurity (02/14/2022)   Hunger Vital  Sign    Worried About Running Out of Food in the Last Year: Never true    Ran Out of Food in the Last Year: Never true  Transportation Needs: No Transportation Needs (02/14/2022)   PRAPARE - Hydrologist (Medical): No    Lack of Transportation (Non-Medical): No  Physical Activity: Sufficiently Active (09/26/2021)   Exercise Vital Sign    Days of Exercise per Week: 5 days    Minutes of Exercise per Session: 30 min  Stress: No Stress Concern Present (09/26/2021)   [place]va    Feeling of Stress :  Only a little  Social Connections: Socially Isolated (09/26/2021)   Social Connection and Isolation Panel [NHANES]    Frequency of Communication with Friends and Family: Twice a week    Frequency of Social Gatherings with Friends and Family: Twice a week    Attends Religious Services: Never    Marine scientist or Organizations: Not on file    Attends Archivist Meetings: Never    Marital Status: Never married  Intimate Partner Violence: Not At Risk (02/14/2022)   Humiliation, Afraid, Rape, and Kick questionnaire    Fear of Current or Ex-Partner: No    Emotionally Abused: No    Physically Abused: No    Sexually Abused: No    FAMILY HISTORY: Family History  Problem Relation Age of Onset   Hypertension Mother    Heart disease Mother    Hypertension Father    Diabetes Father    Lymphoma Sister    Heart disease Brother 43   Cancer Paternal Grandfather    Colon cancer Neg Hx    Prostate cancer Neg Hx    Bladder Cancer Neg Hx    Kidney cancer Neg Hx    Colon polyps Neg Hx    Esophageal cancer Neg Hx    Pancreatic cancer Neg Hx    Stomach cancer Neg Hx     ALLERGIES:  has No Known Allergies.  MEDICATIONS:  Current Outpatient Medications  Medication Sig Dispense Refill   acetaminophen (TYLENOL) 325 MG tablet Take 650 mg by mouth as needed for moderate pain or mild pain.      albuterol (VENTOLIN HFA) 108 (90 Base) MCG/ACT inhaler Inhale 2 puffs into the lungs every 6 (six) hours as needed for wheezing or shortness of breath. 18 g 2   Ascorbic Acid (VITAMIN C) 1000 MG tablet Take 1 tablet (1,000 mg total) by mouth daily.     aspirin 81 MG EC tablet Take 81 mg by mouth daily.       Calcium-Magnesium-Vitamin D (CALCIUM 1200+D3 PO) Take 1 tablet by mouth daily.     Carboxymethylcellul-Glycerin (LUBRICATING EYE DROPS OP) Place 1 drop into both eyes daily as needed (irritation).     cetirizine (ZYRTEC) 10 MG chewable tablet Chew 10 mg by mouth daily.     Cholecalciferol (VITAMIN D3) 50 MCG (2000 UT) TABS Take 2,000 Units by mouth daily. 30 tablet    Cyanocobalamin (B-12) 5000 MCG CAPS Take 5,000 mcg by mouth daily.     docusate sodium (COLACE) 100 MG capsule Take 200 mg by mouth at bedtime.     finasteride (PROSCAR) 5 MG tablet Take 1 tablet (5 mg total) by mouth daily. 30 tablet 6   fluticasone (FLONASE) 50 MCG/ACT nasal spray Place 2 sprays into both nostrils daily.     lidocaine-prilocaine (EMLA) cream Apply 30 -45 mins prior to port access. 30 g 3   Melatonin 10 MG CAPS Take 10 mg by mouth at bedtime as needed (sleep).     pantoprazole (PROTONIX) 40 MG tablet Take 1 tablet (40 mg total) by mouth daily. 90 tablet 1   simvastatin (ZOCOR) 40 MG tablet Take 1 tablet (40 mg total) by mouth at bedtime. 90 tablet 3   valsartan (DIOVAN) 80 MG tablet Take 0.5 tablets (40 mg total) by mouth daily. One daily     Vitamin E 450 MG (1000 UT) CAPS Take 450 Units by mouth daily.     Zinc 50 MG TABS Take 50 mg by mouth daily.  benzonatate (TESSALON) 200 MG capsule Take 1 capsule (200 mg total) by mouth 3 (three) times daily as needed for cough. (Patient not taking: Reported on 07/18/2022) 60 capsule 0   guaiFENesin (MUCINEX) 600 MG 12 hr tablet Take by mouth 2 (two) times daily. (Patient not taking: Reported on 07/18/2022)     nitroGLYCERIN (NITROSTAT) 0.4 MG SL tablet Place 1 tablet  (0.4 mg total) under the tongue every 5 (five) minutes as needed for chest pain. (Patient not taking: Reported on 07/18/2022) 25 tablet 3   prochlorperazine (COMPAZINE) 10 MG tablet Take 1 tablet (10 mg total) by mouth every 6 (six) hours as needed for nausea or vomiting. (Patient not taking: Reported on 06/05/2022) 40 tablet 1   traMADol (ULTRAM) 50 MG tablet TAKE 1 TABLET BY MOUTH 3 TIMES DAILY AS NEEDED FOR MODERATE PAIN (Patient not taking: Reported on 06/05/2022) 90 tablet 0   No current facility-administered medications for this visit.   Facility-Administered Medications Ordered in Other Visits  Medication Dose Route Frequency Provider Last Rate Last Admin   heparin lock flush 100 UNIT/ML injection               .  PHYSICAL EXAMINATION: ECOG PERFORMANCE STATUS: 0 - Asymptomatic  Vitals:   07/18/22 1038  BP: (!) 107/55  Pulse: 72  Resp: 16  Temp: 97.8 F (36.6 C)  SpO2: 97%      Filed Weights   07/18/22 1038  Weight: 212 lb 9.6 oz (96.4 kg)        Physical Exam HENT:     Head: Normocephalic and atraumatic.     Mouth/Throat:     Pharynx: No oropharyngeal exudate.  Eyes:     Pupils: Pupils are equal, round, and reactive to light.  Cardiovascular:     Rate and Rhythm: Normal rate and regular rhythm.  Pulmonary:     Effort: No respiratory distress.     Breath sounds: No wheezing.     Comments: Decreased air entry bilaterally. Abdominal:     General: Bowel sounds are normal. There is no distension.     Palpations: Abdomen is soft. There is no mass.     Tenderness: There is no abdominal tenderness. There is no guarding or rebound.  Musculoskeletal:        General: No tenderness. Normal range of motion.     Cervical back: Normal range of motion and neck supple.  Skin:    General: Skin is warm.  Neurological:     Mental Status: He is alert and oriented to person, place, and time.  Psychiatric:        Mood and Affect: Affect normal.      LABORATORY DATA:   I have reviewed the data as listed Lab Results  Component Value Date   WBC 6.7 07/18/2022   HGB 15.8 07/18/2022   HCT 46.6 07/18/2022   MCV 90.1 07/18/2022   PLT 203 07/18/2022   Recent Labs    04/04/22 0852 05/09/22 1022 07/18/22 1012  NA 139 138 137  K 4.0 3.6 4.1  CL 108 106 103  CO2 24 23 24   GLUCOSE 103* 137* 79  BUN 14 16 19   CREATININE 0.90 0.94 1.22  CALCIUM 9.2 9.2 9.3  GFRNONAA >60 >60 60*  PROT 7.1 6.9 7.1  ALBUMIN 4.1 3.9 4.2  AST 18 19 20   ALT 14 21 21   ALKPHOS 57 56 57  BILITOT 0.6 0.7 0.7    RADIOGRAPHIC STUDIES: I have personally reviewed the  radiological images as listed and agreed with the findings in the report. CT CHEST ABDOMEN PELVIS W CONTRAST  Result Date: 07/15/2022 CLINICAL DATA:  Non-small cell lung cancer, metastatic. Chemotherapy finished Ali 2022. Finished immunotherapy since the last study. * Tracking Code: BO * EXAM: CT CHEST, ABDOMEN, AND PELVIS WITH CONTRAST TECHNIQUE: Multidetector CT imaging of the chest, abdomen and pelvis was performed following the standard protocol during bolus administration of intravenous contrast. RADIATION DOSE REDUCTION: This exam was performed according to the departmental dose-optimization program which includes automated exposure control, adjustment of the mA and/or kV according to patient size and/or use of iterative reconstruction technique. CONTRAST:  135mL OMNIPAQUE IOHEXOL 300 MG/ML  SOLN COMPARISON:  CT chest 02/11/2022 and CT chest abdomen pelvis 11/01/2021. FINDINGS: CT CHEST FINDINGS Cardiovascular: Right IJ Port-A-Cath terminates in the low SVC. Atherosclerotic calcification of the aorta. Heart is enlarged. No pericardial effusion. Mediastinum/Nodes: No pathologically enlarged mediastinal, hilar or axillary lymph nodes. Esophagus is grossly unremarkable. Lungs/Pleura: Centrilobular and paraseptal emphysema. Progressive peripheral ground-glass and subpleural reticular densities, with a slight basilar  predominance. Post treatment scarring in the superior segment left lower lobe (3/90). No pleural fluid. Airway is unremarkable. Musculoskeletal: Degenerative changes in the spine. No worrisome lytic or sclerotic lesions. CT ABDOMEN PELVIS FINDINGS Hepatobiliary: Low-attenuation lesions in the liver measure up to 1.6 cm in the dome, as before. Liver and gallbladder are otherwise unremarkable. No biliary ductal dilatation. Pancreas: Negative. Spleen: Negative. Adrenals/Urinary Tract: Adrenal glands are unremarkable. Low-attenuation lesions in the kidneys measure up to 1.2 cm on the right, too small to characterize. No specific follow-up necessary other than on routine follow-up. Ureters are decompressed. Bladder is grossly unremarkable. Stomach/Bowel: Stomach, small bowel and appendix are unremarkable. Fair amount of stool in the colon is indicative of constipation. Vascular/Lymphatic: Atherosclerotic calcification of the aorta. No pathologically enlarged lymph nodes. Reproductive: Prostate is visualized. Other: Small bilateral inguinal hernias contain fat. No free fluid. Mesenteries and peritoneum are unremarkable. Musculoskeletal: Question previous trauma along the lateral right iliac wing. Degenerative changes in the spine. No worrisome lytic or sclerotic lesions. IMPRESSION: 1. No evidence of recurrent or metastatic disease. 2. Peripheral and basilar predominant ground-glass and subpleural reticular densities, increased from 01/11/2022. 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 3.  Aortic atherosclerosis (ICD10-I70.0). 4.  Emphysema (ICD10-J43.9). Electronically Signed   By: Lorin Picket M.D.   On: 07/15/2022 14:10     ASSESSMENT & PLAN:   Cancer of lower lobe of left lung ([place]) # LLL- Lung cancer-non-small cell stage IV [right suprarenal nodule-s/p biopsy "non-small cell ca"].  S/p  CarboTaxol plus Keytruda x4; currently on single agent Keytruda on HOLD-CT FEB 6th,  2024- No evidence of recurrent or metastatic disease.   # Last Randall Ali was July, 27th, 2023-  Discussed that it is quite possible that patient's encephalitis could be result of immunosuppression from chemotherapy/steroids/and also immunotherapy.  Given overall stable disease on the CT scan- FEB 2024-I think it is okay to hold further therapy at this time.  Continue surveillance imaging in 4 months.  # CT scan FEB 2024- Peripheral and basilar predominant ground-glass and subpleural reticular densities, increased from 01/11/2022. 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 ? Causing fatigue.  Recommend follow up with pulmonary?   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  Likely contributing to patient's cognitive deficits- [Dr.vaslow] stable.   # CAD-s/p CABG-/PVD-s/p stent [  Dr.Schneir]-improvement in pain noted.  On Plavix -stable.   # IV access: Mediport functioning.  #Incidental findings on Imaging  CT , 2024: Aortic atherosclerosis; Emphysema I reviewed/discussed/counseled the patient.   # DISPOSITION: # port flush in 2 months # follow up in 4 months with MD; port/ labs-cbc/cmp;TSH- No chemo; CT chest- - Dr.B      All  questions were answered. The patient knows to call the clinic with any problems, questions or concerns.    Randall Sickle, MD 07/18/2022 10:59 AM

## 2022-07-18 NOTE — Progress Notes (Signed)
Pt in for follow up, continues to be discouraged by continued fatigue and "being tired all the time".

## 2022-07-29 DIAGNOSIS — D044 Carcinoma in situ of skin of scalp and neck: Secondary | ICD-10-CM | POA: Diagnosis not present

## 2022-07-29 DIAGNOSIS — C44212 Basal cell carcinoma of skin of right ear and external auricular canal: Secondary | ICD-10-CM | POA: Diagnosis not present

## 2022-07-29 DIAGNOSIS — D2261 Melanocytic nevi of right upper limb, including shoulder: Secondary | ICD-10-CM | POA: Diagnosis not present

## 2022-07-29 DIAGNOSIS — Z85820 Personal history of malignant melanoma of skin: Secondary | ICD-10-CM | POA: Diagnosis not present

## 2022-07-29 DIAGNOSIS — L570 Actinic keratosis: Secondary | ICD-10-CM | POA: Diagnosis not present

## 2022-07-29 DIAGNOSIS — D2262 Melanocytic nevi of left upper limb, including shoulder: Secondary | ICD-10-CM | POA: Diagnosis not present

## 2022-07-29 DIAGNOSIS — D485 Neoplasm of uncertain behavior of skin: Secondary | ICD-10-CM | POA: Diagnosis not present

## 2022-07-29 DIAGNOSIS — D0461 Carcinoma in situ of skin of right upper limb, including shoulder: Secondary | ICD-10-CM | POA: Diagnosis not present

## 2022-07-29 DIAGNOSIS — D2271 Melanocytic nevi of right lower limb, including hip: Secondary | ICD-10-CM | POA: Diagnosis not present

## 2022-07-29 DIAGNOSIS — D0359 Melanoma in situ of other part of trunk: Secondary | ICD-10-CM | POA: Diagnosis not present

## 2022-07-29 DIAGNOSIS — D225 Melanocytic nevi of trunk: Secondary | ICD-10-CM | POA: Diagnosis not present

## 2022-07-29 DIAGNOSIS — C4442 Squamous cell carcinoma of skin of scalp and neck: Secondary | ICD-10-CM | POA: Diagnosis not present

## 2022-07-29 DIAGNOSIS — Z85828 Personal history of other malignant neoplasm of skin: Secondary | ICD-10-CM | POA: Diagnosis not present

## 2022-08-05 ENCOUNTER — Ambulatory Visit: Payer: PPO | Admitting: Neurology

## 2022-08-06 ENCOUNTER — Other Ambulatory Visit: Payer: PPO

## 2022-08-15 ENCOUNTER — Encounter: Payer: Self-pay | Admitting: Internal Medicine

## 2022-08-15 ENCOUNTER — Telehealth: Payer: Self-pay | Admitting: *Deleted

## 2022-08-15 ENCOUNTER — Ambulatory Visit (INDEPENDENT_AMBULATORY_CARE_PROVIDER_SITE_OTHER): Payer: PPO | Admitting: Internal Medicine

## 2022-08-15 VITALS — BP 112/60 | HR 66 | Temp 97.3°F | Ht 68.0 in | Wt 213.2 lb

## 2022-08-15 DIAGNOSIS — R0609 Other forms of dyspnea: Secondary | ICD-10-CM | POA: Diagnosis not present

## 2022-08-15 NOTE — Assessment & Plan Note (Signed)
Stopped smoking 2001 Onset early 2023  Highly variable on ACEi  -PFT 's nl 2016 x for ERV 13% @ wt  210 - 10/31/2021  RA pt  walked average pace and expressed shortness of breath p 350 ft. Stated he needed to stop due to his back and legs hurting with lowest sats 93% - trial off acei 10/31/2021 > better 01/03/2022  - 01/03/2022  After extensive coaching inhaler device,  effectiveness =   90%  hfa > use saba prn  - CT 07/15/22 basilar predominant ground-glass and subpleural  reticular densities, increased from 01/11/2022. - 08/15/2022   Walked on RA  x  2  lap(s) =  approx 350  ft  @ moderately fast pace, stopped due to legs tired with lowest 02 sats 90%   Between the chemo stopped in July 2023 and the severe viral encephalitis admit in 02/2022 he has not yet fully recovered and may have some residual effects of ALI but main limit at this point is overall conditioning and arthritic symptoms in back and legs, not doe   Rec: Make sure you check your oxygen saturation at your highest level of activity(NOT after you stop)  to be sure it stays over 90% and keep track of it at least once a week, more often if breathing getting worse, and let me know if losing ground. (Collect the dots to connect the dots approach)  - can also try albuterol prn if sob at rest or before exertion on a trial basis though I see no evidence for airflow obstruction at this point.   F/u prn losing ground with ex due to sob or desats but nothing else to offer at this point from this clinic.   Each maintenance medication was reviewed in detail including emphasizing most importantly the difference between maintenance and prns and under what circumstances the prns are to be triggered using an action plan format where appropriate.  Total time for H and P, chart review, counseling, reviewing hfa  device(s) , directly observing portions of ambulatory 02 saturation study/ and generating customized AVS unique to this office visit / same day  charting > 30 min for multiple  refractory  symptoms of uncertain etiology .

## 2022-08-15 NOTE — Patient Instructions (Signed)
Only use your albuterol as a rescue medication to be used if you can't catch your breath by resting or doing a relaxed purse lip breathing pattern.  - The less you use it, the better it will work when you need it. - Ok to use up to 2 puffs  every 4 hours if you must but call for immediate appointment if use goes up over your usual need - Don't leave home without it !!  (think of it like the spare tire for your car)    Make sure you check your oxygen saturation at your highest level of activity(NOT after you stop)  to be sure it stays over 90% and keep track of it once a week, more often if breathing getting worse, and let me know if losing ground. (Collect the dots to connect the dots approach)  -  try to pace yourself slower than you did today to get to 20 minutes at least of exercise daily (ideally 30 min target)  Pulmonary follow up is as needed

## 2022-08-15 NOTE — Progress Notes (Signed)
  Care Coordination  Outreach Note  08/15/2022 Name: Randall Ali. MRN: [id number] DOB: 11-26-1940   Care Coordination Outreach Attempts: An unsuccessful telephone outreach was attempted today to offer the patient information about available care coordination services as a benefit of their health plan.   Follow Up Plan:  Additional outreach attempts will be made to offer the patient care coordination information and services.   Encounter Outcome:  Pt. Request to Call [place], Maish [place] Direct Dial: [phone]

## 2022-08-15 NOTE — Progress Notes (Signed)
Randall Lofts., male    DOB: 08/20/1940   MRN: [id number]   Brief patient profile:  4  yowm  quit smoking 2001  referred to pulmonary clinic in [facility]  10/31/2021 by Dr Dalbert Mayotte for variable doe with PFT 's nl 2016 x for ERV 13% @ wt  56  Sees Dr Mortimer Fries for OSA    HEMATOLOGY/ONCOLOGY HISTORY:      Oncology History Overview Note   #MAY 2022-Lung cancer-non-small cell [CT guided bx] T3N1 vs stage IV Dr.Hendrickson.  MRI brain negative for malignancy.# 1. April 2022- LLL ~4.0 cm mass in the superior segment left lower lobe abuts the major fissure and the posterior pleural surface without visible chest wall invasion without pleural effusion. There 2-3 other small nodules in the left lower lobe, largest measures 9 mm in diameter. These are suspicious for same lobe satellite lesions and there is left hilar adenopathy. Assuming non-small cell lung cancer the appearance is compatible with T3 N1 M0 disease (stage IIIA).   # right suprarenal nodule-awaiting biopsy on 5/31- non-small cell [revived at tumor conference at [facility]   # June 9th 2022- CARBO-TAXOl-KEYTRUDA; Fulphila   # ? Subtle adrenal insufficiency- No acute process - -September MRI brain negative for any pituitary hypophysitis/brain metastasis. Prednisone    MOLECULAR TESTING: NGS TPS-PDL 100%; EXON 12 amplification* mutations.       # CAD [CABG 2016; Dr.Gollan]    Cancer of lower lobe of left lung ([place])   11/02/2020 Initial Diagnosis     Cancer of lower lobe of left lung ([place])     11/02/2020 Cancer Staging     Staging form: Lung, AJCC 8th Edition - Clinical: Stage IVA (cT3, cN1, cM1a) - Signed by Cammie Sickle, MD on 11/02/2020     11/16/2020 -  Chemotherapy     Patient is on Treatment Plan : LUNG NSCLC Carboplatin + Paclitaxel + Pembrolizumab q21d x 4 cycles / Pembrolizumab Maintenance Q21D          History of Present Illness  10/31/2021  Pulmonary/ 1st office eval/ Randall Ali / [place]  Office at wt 81  Chief  Complaint  Patient presents with   Follow-up    Patient feels like he is having more shortness of breath with activity and states "he is breathing real hard" Patient has CPAP and is sleeping good.  Dyspnea:  legs stop before breathing after 200  ft (2 aisles) / work out at gym rides bike for 30 min s stopping twice weekly at yet variably doe 50 ft at hme  Cough: none but variable congestion in throat /globus sensation  Sleep: fine on cpap per [facility] SABA use: saba helps variable  "wheeze" last used 7am  day of ov Rec Only use your albuterol as a rescue medication   Also Ok to try albuterol 15 min before an activity (on alternating days)  that you know would usually make you short of breath Stop lisinopril 5 mg  and replace with valsartan 80 mg one daily  I will check your scan for your breathing and let you know if I have any additional recs  Continue pantoprazole  Take 30-60 min before first meal of the day GERD diet  Please schedule a follow up office visit in 6 weeks, call sooner if needed - bring inhalers with you but don't take that am    01/03/2022  f/u ov/Randall Ali/ [facility] re: doe/cough ? Acei case   maint on ppi ac qd  Chief  Complaint  Patient presents with   Follow-up    Breathing is much improved.  He ran out of albuterol inhaler 3-4 wks ago-had been using it 2 x daily before he ran out. He feels tired in the morning when he gets up.    Dyspnea:  improved, doing ex bike just fine, not limited by back but by back and knees Cough: still some sensation of globus but much better, daytime only, not using ppi ac  - taking clariton daily but denies itching / sneezing  Sleeping: needs cpap renewal by [facility] SABA use: none and feels if anything better  02: none  Covid status:   first 2 vax Rec No change in your blood pressure medication > follow  up with your PCP Only use your albuterol as a rescue medication Ok to try albuterol 15 min before an activity (on alternating days)  that  you know would usually make you short of breath  See Dr Mortimer Fries next available appt for your cpap     08/15/2022  f/u ov/Randall Ali/ [facility] re: doe maint on ppi ac  last rx  Ketruda July 2023 Chief Complaint  Patient presents with   Follow-up    Doing pretty good. DOE. No wheezing or cough.  Dyspnea:  walking 5 min  Cough: none  Sleeping: cpap flat  SABA use: not needing it  02: none     No obvious day to day or daytime variability or assoc excess/ purulent sputum or mucus plugs or hemoptysis or cp or chest tightness, subjective wheeze or overt sinus or hb symptoms.   Sleeping great  without nocturnal  or early am exacerbation  of respiratory  c/o's or need for noct saba. Also denies any obvious fluctuation of symptoms with weather or environmental changes or other aggravating or alleviating factors except as outlined above   No unusual exposure hx or h/o childhood pna/ asthma or knowledge of premature birth.  Current Allergies, Complete Past Medical History, Past Surgical History, Family History, and Social History were reviewed in [employer] record.  ROS  The following are not active complaints unless bolded Hoarseness, sore throat, dysphagia, dental problems, itching, sneezing,  nasal congestion or discharge of excess mucus or purulent secretions, ear ache,   fever, chills, sweats, unintended wt loss or wt gain, classically pleuritic or exertional cp,  orthopnea pnd or arm/hand swelling  or leg swelling, presyncope, palpitations, abdominal pain, anorexia, nausea, vomiting, diarrhea  or change in bowel habits or change in bladder habits, change in stools or change in urine, dysuria, hematuria,  rash, arthralgias, visual complaints, headache, numbness, weakness or ataxia or problems with walking or coordination,  change in mood or  memory.        Current Meds  Medication Sig   acetaminophen (TYLENOL) 325 MG tablet Take 650 mg by mouth as needed for moderate pain  or mild pain.   albuterol (VENTOLIN HFA) 108 (90 Base) MCG/ACT inhaler Inhale 2 puffs into the lungs every 6 (six) hours as needed for wheezing or shortness of breath.   Ascorbic Acid (VITAMIN C) 1000 MG tablet Take 1 tablet (1,000 mg total) by mouth daily.   aspirin 81 MG EC tablet Take 81 mg by mouth daily.     Calcium-Magnesium-Vitamin D (CALCIUM 1200+D3 PO) Take 1 tablet by mouth daily.   Carboxymethylcellul-Glycerin (LUBRICATING EYE DROPS OP) Place 1 drop into both eyes daily as needed (irritation).   cetirizine (ZYRTEC) 10 MG chewable tablet Chew 10 mg  by mouth daily.   Cholecalciferol (VITAMIN D3) 50 MCG (2000 UT) TABS Take 2,000 Units by mouth daily.   Cyanocobalamin (B-12) 5000 MCG CAPS Take 5,000 mcg by mouth daily.   docusate sodium (COLACE) 100 MG capsule Take 200 mg by mouth at bedtime.   finasteride (PROSCAR) 5 MG tablet Take 1 tablet (5 mg total) by mouth daily.   fluticasone (FLONASE) 50 MCG/ACT nasal spray Place 2 sprays into both nostrils daily.   lidocaine-prilocaine (EMLA) cream Apply 30 -45 mins prior to port access.   Melatonin 10 MG CAPS Take 10 mg by mouth at bedtime as needed (sleep).   pantoprazole (PROTONIX) 40 MG tablet Take 1 tablet (40 mg total) by mouth daily.   prochlorperazine (COMPAZINE) 10 MG tablet Take 1 tablet (10 mg total) by mouth every 6 (six) hours as needed for nausea or vomiting.   simvastatin (ZOCOR) 40 MG tablet Take 1 tablet (40 mg total) by mouth at bedtime.   valsartan (DIOVAN) 80 MG tablet Take 0.5 tablets (40 mg total) by mouth daily. One daily   Vitamin E 450 MG (1000 UT) CAPS Take 450 Units by mouth daily.   Zinc 50 MG TABS Take 50 mg by mouth daily.          Past Medical History:  Diagnosis Date   Allergy    seasonal   Arthritis    all over- in general    CAD (coronary artery disease)    a. inferior wall MI 10/01 s/p PCI/DES to RCA; b. Myoview 3/16 neg for ischemia; c. LHC 8/16: ostLAD 80%, OM1 70%, OM2 70% x 2 lesions, mRCA 30%,  dRCA 70% s/p 4-V CABG 01/24/15 (LIMA-LAD, VG- OM1, VG-OM2, VG-PDA)    Cancer (HCC)    skin, melanoma   Carotid artery disease ([place])    a. [place] 8/16: 1-39% bilateral ICA stenosis   Cataract    removed   Diastolic dysfunction    a. 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   stent RCA   Lung cancer ([place])    Postoperative wound infection 02/02/2015   Reflux esophagitis    Sleep apnea 2017   CPAP at night        Objective:     08/15/2022         213   01/03/22 214 lb (97.1 kg)  01/02/22 216 lb 6.4 oz (98.2 kg)  12/13/21 218 lb (98.9 kg)    Vital signs reviewed  08/15/2022  - Note at rest 02 sats  93% on RA   General appearance:    chronically ill amb wm nad     HEENT : Oropharynx  clear         NECK :  without  apparent JVD/ palpable Nodes/TM    LUNGS: no acc muscle use,  Nl contour chest which is clear to A and P bilaterally without cough on insp or exp maneuvers   CV:  RRR  no s3 or murmur or increase in P2, and no edema   ABD:  soft and nontender with nl inspiratory excursion in the supine position. No bruits or organomegaly appreciated   MS:  Nl gait/ ext warm without deformities Or obvious joint restrictions  calf tenderness, cyanosis or clubbing    SKIN: warm and dry without lesions    NEURO:  alert, approp, nl sensorium with  no motor or cerebellar deficits apparent.        I personally reviewed images and agree with radiology impression as follows:   Chest CT 07/15/22       1. No evidence of recurrent or metastatic disease. 2. Peripheral and basilar predominant ground-glass and subpleural reticular densities, increased from 01/11/2022. 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 3.  Aortic atherosclerosis (ICD10-I70.0). 4.  Emphysema (ICD10-J43.9).           Assessment

## 2022-08-29 NOTE — Progress Notes (Unsigned)
  Care Coordination  Outreach Note  08/29/2022 Name: Randall Ali. MRN: [id number] DOB: 06/24/1940   Care Coordination Outreach Attempts: A second unsuccessful outreach was attempted today to offer the patient with information about available care coordination services as a benefit of their health plan.     Follow Up Plan:  Additional outreach attempts will be made to offer the patient care coordination information and services.   Encounter Outcome:  No Answer  Julian Hy, [place] Direct Dial: [phone]

## 2022-09-02 ENCOUNTER — Telehealth: Payer: Self-pay | Admitting: Family Medicine

## 2022-09-02 NOTE — Telephone Encounter (Signed)
Spoke with pt's daughter, [name] (on dpr), about finasteride. Notified her according to his chart, pt is to take this med once a day for BPH. She verbalizes understanding and expresses her thanks for the call back.

## 2022-09-02 NOTE — Telephone Encounter (Signed)
Patients daughter called in to ask if patient should still be taking  finasteride (PROSCAR) 5 MG tablet she was looking through his medications,and seen this one and was wondering what it was prescribed for and does he still need to take it?Please advise.

## 2022-09-03 ENCOUNTER — Other Ambulatory Visit: Payer: Self-pay | Admitting: Family Medicine

## 2022-09-03 DIAGNOSIS — M9905 Segmental and somatic dysfunction of pelvic region: Secondary | ICD-10-CM | POA: Diagnosis not present

## 2022-09-03 DIAGNOSIS — M62830 Muscle spasm of back: Secondary | ICD-10-CM | POA: Diagnosis not present

## 2022-09-03 DIAGNOSIS — N401 Enlarged prostate with lower urinary tract symptoms: Secondary | ICD-10-CM

## 2022-09-03 DIAGNOSIS — M9903 Segmental and somatic dysfunction of lumbar region: Secondary | ICD-10-CM | POA: Diagnosis not present

## 2022-09-03 DIAGNOSIS — M955 Acquired deformity of pelvis: Secondary | ICD-10-CM | POA: Diagnosis not present

## 2022-09-04 NOTE — Progress Notes (Signed)
  Care Coordination  Outreach Note  09/04/2022 Name: Randall Ali. MRN: [id number] DOB: Oct 22, 1940   Care Coordination Outreach Attempts: A third unsuccessful outreach was attempted today to offer the patient with information about available care coordination services as a benefit of their health plan.   Follow Up Plan:  No further outreach attempts will be made at this time. We have been unable to contact the patient to offer or enroll patient in care coordination services  Encounter Outcome:  No Answer  Julian Hy, [place] Direct Dial: [phone]

## 2022-09-04 NOTE — Telephone Encounter (Signed)
Called to speak to pt because his med list stated he was not taking it. He said he is not taking it. I will remove it from his med list.

## 2022-09-15 DIAGNOSIS — D234 Other benign neoplasm of skin of scalp and neck: Secondary | ICD-10-CM | POA: Diagnosis not present

## 2022-09-15 DIAGNOSIS — C4442 Squamous cell carcinoma of skin of scalp and neck: Secondary | ICD-10-CM | POA: Diagnosis not present

## 2022-09-16 ENCOUNTER — Inpatient Hospital Stay: Payer: PPO | Attending: Internal Medicine

## 2022-09-16 DIAGNOSIS — C3432 Malignant neoplasm of lower lobe, left bronchus or lung: Secondary | ICD-10-CM | POA: Diagnosis not present

## 2022-09-16 DIAGNOSIS — Z95828 Presence of other vascular implants and grafts: Secondary | ICD-10-CM

## 2022-09-16 DIAGNOSIS — Z452 Encounter for adjustment and management of vascular access device: Secondary | ICD-10-CM | POA: Insufficient documentation

## 2022-09-16 MED ORDER — HEPARIN SOD (PORK) LOCK FLUSH 100 UNIT/ML IV SOLN
INTRAVENOUS | Status: AC
  Administered 2022-09-16: 500 [IU] via INTRAVENOUS
  Filled 2022-09-16: qty 5

## 2022-09-16 MED ORDER — SODIUM CHLORIDE 0.9% FLUSH
10.0000 mL | INTRAVENOUS | Status: DC | PRN
  Administered 2022-09-16: 10 mL via INTRAVENOUS
  Filled 2022-09-16: qty 10

## 2022-09-16 MED ORDER — HEPARIN SOD (PORK) LOCK FLUSH 100 UNIT/ML IV SOLN
500.0000 [IU] | Freq: Once | INTRAVENOUS | Status: AC
  Filled 2022-09-16: qty 5

## 2022-09-22 DIAGNOSIS — D0359 Melanoma in situ of other part of trunk: Secondary | ICD-10-CM | POA: Diagnosis not present

## 2022-09-22 DIAGNOSIS — D235 Other benign neoplasm of skin of trunk: Secondary | ICD-10-CM | POA: Diagnosis not present

## 2022-10-07 DIAGNOSIS — D0461 Carcinoma in situ of skin of right upper limb, including shoulder: Secondary | ICD-10-CM | POA: Diagnosis not present

## 2022-10-07 DIAGNOSIS — D044 Carcinoma in situ of skin of scalp and neck: Secondary | ICD-10-CM | POA: Diagnosis not present

## 2022-10-14 ENCOUNTER — Telehealth: Payer: Self-pay | Admitting: Family Medicine

## 2022-10-14 NOTE — Telephone Encounter (Signed)
Contacted Randall Ali. to schedule their annual wellness visit. Appointment made for 11/24/2022.  [facility] Care Guide [facility] AWV TEAM Direct Dial: [phone]

## 2022-10-24 ENCOUNTER — Ambulatory Visit
Admission: RE | Admit: 2022-10-24 | Discharge: 2022-10-24 | Disposition: A | Discharge status: Home / Self Care | Payer: PPO | Source: Ambulatory Visit | Attending: Internal Medicine | Admitting: Internal Medicine

## 2022-10-24 DIAGNOSIS — G9389 Other specified disorders of brain: Secondary | ICD-10-CM | POA: Diagnosis not present

## 2022-10-24 DIAGNOSIS — B1009 Other human herpesvirus encephalitis: Secondary | ICD-10-CM | POA: Diagnosis not present

## 2022-10-24 MED ORDER — GADOBUTROL 1 MMOL/ML IV SOLN
10.0000 mL | Freq: Once | INTRAVENOUS | Status: AC | PRN
  Administered 2022-10-24: 9 mL via INTRAVENOUS

## 2022-10-31 ENCOUNTER — Encounter: Payer: Self-pay | Admitting: Internal Medicine

## 2022-10-31 ENCOUNTER — Inpatient Hospital Stay: Payer: PPO | Admitting: Internal Medicine

## 2022-10-31 ENCOUNTER — Inpatient Hospital Stay: Payer: PPO | Attending: Internal Medicine | Admitting: Internal Medicine

## 2022-10-31 VITALS — BP 123/53 | HR 64 | Temp 97.6°F | Resp 19 | Wt 208.8 lb

## 2022-10-31 DIAGNOSIS — B1009 Other human herpesvirus encephalitis: Secondary | ICD-10-CM

## 2022-10-31 DIAGNOSIS — Z452 Encounter for adjustment and management of vascular access device: Secondary | ICD-10-CM | POA: Diagnosis not present

## 2022-10-31 DIAGNOSIS — Z79899 Other long term (current) drug therapy: Secondary | ICD-10-CM | POA: Insufficient documentation

## 2022-10-31 DIAGNOSIS — Z87891 Personal history of nicotine dependence: Secondary | ICD-10-CM | POA: Diagnosis not present

## 2022-10-31 DIAGNOSIS — C3432 Malignant neoplasm of lower lobe, left bronchus or lung: Secondary | ICD-10-CM | POA: Diagnosis not present

## 2022-10-31 DIAGNOSIS — R29818 Other symptoms and signs involving the nervous system: Secondary | ICD-10-CM | POA: Diagnosis not present

## 2022-10-31 DIAGNOSIS — B004 Herpesviral encephalitis: Secondary | ICD-10-CM | POA: Diagnosis not present

## 2022-10-31 DIAGNOSIS — I10 Essential (primary) hypertension: Secondary | ICD-10-CM | POA: Diagnosis not present

## 2022-10-31 DIAGNOSIS — G4733 Obstructive sleep apnea (adult) (pediatric): Secondary | ICD-10-CM | POA: Diagnosis not present

## 2022-10-31 DIAGNOSIS — Z7982 Long term (current) use of aspirin: Secondary | ICD-10-CM | POA: Diagnosis not present

## 2022-10-31 NOTE — Progress Notes (Signed)
Patient has no concerns today. 

## 2022-10-31 NOTE — Progress Notes (Signed)
[facility] [address]  [place], [place] [address] [phone]   Interval Evaluation  Date of Service: 10/31/22 Patient Name: Randall Ali. Patient MRN: [id number] Patient DOB: Feb 18, 1941 Provider: Henreitta Leber, MD  Identifying Statement:  Randall Chao. is a 82 y.o. 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    Referring Provider: Eustaquio Boyden, MD [address] [place],  [place] [address]  Primary Cancer:  Oncologic History: Oncology History Overview Note  #MAY 2022-Lung cancer-non-small cell [CT guided bx] T3N1 vs stage IV Dr.Hendrickson.  MRI brain negative for malignancy.# 1. April 2022- LLL ~4.0 cm mass in the superior segment left lower lobe abuts the major fissure and the posterior pleural surface without visible chest wall invasion without pleural effusion. There 2-3 other small nodules in the left lower lobe, largest measures 9 mm in diameter. These are suspicious for same lobe satellite lesions and there is left hilar adenopathy. Assuming non-small cell lung cancer the appearance is compatible with T3 N1 M0 disease (stage IIIA).  # right suprarenal nodule-awaiting biopsy on 5/31- non-small cell [revived at tumor conference at [facility]]  # June 9th 2022- CARBO-TAXOl-KEYTRUDA; Fulphila  # ? Subtle adrenal insufficiency- No acute process - -September MRI brain negative for any pituitary hypophysitis/brain metastasis. Prednisone   MOLECULAR TESTING: NGS TPS-PDL 100%; EXON 12 amplification* mutations.  # CAD [CABG 2016; Dr.Gollan]   Cancer of lower lobe of left lung (HCC)  11/02/2020 Initial Diagnosis   Cancer of lower lobe of left lung (HCC)   11/02/2020 Cancer Staging   Staging form: Lung, AJCC 8th Edition - Clinical: Stage IVA (cT3, cN1, cM1a) - Signed by Earna Coder, MD on  11/02/2020   11/16/2020 - 01/02/2022 Chemotherapy   Patient is on Treatment Plan : LUNG NSCLC Carboplatin + Paclitaxel + Pembrolizumab q21d x 4 cycles / Pembrolizumab Maintenance Q21D     11/16/2020 -  Chemotherapy   Patient is on Treatment Plan : LUNG NSCLC Carboplatin (6) + Paclitaxel (200) + Pembrolizumab (200) D1 q21d x 4 cycles / Pembrolizumab (200) Maintenance D1 q21d       Interval History: Randall Ali. Presents today for follow up after recent MRI brain.  He does complain of daily headaches "in my temples".  This has been going on for several months.  No other new or progressive neurologic changes reported.  Remains with dense memory impairment, unchanged from prior.  Requires assistance with some ADLs at home.  Remains intermittently compliant with CPAP, acknowledges having issues with the mask lately.  H+P (04/18/22) Patient presents for evaluation today following recent admission for herpes encephalitis.  He was admitted to [facility] on 02/20/22 for progressive confusion, disorganized thinking, memory impairment.  He was monitored and treated with EEG and valacyclovir x14 days.  Per daughter, confusion and memory impairment improved modestly during the admission.  Currently, he still cannot remember most conversations.  He dresses, feeds, toilets himself, but does "forget what he is doing when standing in the shower".  Daughter is now living with him to help with ADLs, prior to this he was completely independent.  Also complains of headaches "in my temples" every day since discharge.  He has been dosing tylenol 500mg  twice per day for ~2 months.  Intermittently non-compliant with CPAP during that time.  Medications: Current Outpatient Medications on File Prior to  Visit  Medication Sig Dispense Refill   acetaminophen (TYLENOL) 325 MG tablet Take 650 mg by mouth as needed for moderate pain or mild pain.     albuterol (VENTOLIN HFA) 108 (90 Base) MCG/ACT inhaler Inhale 2 puffs into the lungs  every 6 (six) hours as needed for wheezing or shortness of breath. 18 g 2   Ascorbic Acid (VITAMIN C) 1000 MG tablet Take 1 tablet (1,000 mg total) by mouth daily.     aspirin 81 MG EC tablet Take 81 mg by mouth daily.       Calcium-Magnesium-Vitamin D (CALCIUM 1200+D3 PO) Take 1 tablet by mouth daily.     Carboxymethylcellul-Glycerin (LUBRICATING EYE DROPS OP) Place 1 drop into both eyes daily as needed (irritation).     cetirizine (ZYRTEC) 10 MG chewable tablet Chew 10 mg by mouth daily.     Cholecalciferol (VITAMIN D3) 50 MCG (2000 UT) TABS Take 2,000 Units by mouth daily. 30 tablet    Cyanocobalamin (B-12) 5000 MCG CAPS Take 5,000 mcg by mouth daily.     docusate sodium (COLACE) 100 MG capsule Take 200 mg by mouth at bedtime.     finasteride (PROSCAR) 5 MG tablet TAKE 1 TABLET BY MOUTH DAILY 90 tablet 2   fluticasone (FLONASE) 50 MCG/ACT nasal spray Place 2 sprays into both nostrils daily.     lidocaine-prilocaine (EMLA) cream Apply 30 -45 mins prior to port access. 30 g 3   Melatonin 10 MG CAPS Take 10 mg by mouth at bedtime as needed (sleep).     pantoprazole (PROTONIX) 40 MG tablet Take 1 tablet (40 mg total) by mouth daily. 90 tablet 1   prochlorperazine (COMPAZINE) 10 MG tablet Take 1 tablet (10 mg total) by mouth every 6 (six) hours as needed for nausea or vomiting. 40 tablet 1   simvastatin (ZOCOR) 40 MG tablet Take 1 tablet (40 mg total) by mouth at bedtime. 90 tablet 3   valsartan (DIOVAN) 80 MG tablet Take 0.5 tablets (40 mg total) by mouth daily. One daily     Vitamin E 450 MG (1000 UT) CAPS Take 450 Units by mouth daily.     Zinc 50 MG TABS Take 50 mg by mouth daily.     benzonatate (TESSALON) 200 MG capsule Take 1 capsule (200 mg total) by mouth 3 (three) times daily as needed for cough. (Patient not taking: Reported on 07/18/2022) 60 capsule 0   guaiFENesin (MUCINEX) 600 MG 12 hr tablet Take by mouth 2 (two) times daily. (Patient not taking: Reported on 07/18/2022)      nitroGLYCERIN (NITROSTAT) 0.4 MG SL tablet Place 1 tablet (0.4 mg total) under the tongue every 5 (five) minutes as needed for chest pain. (Patient not taking: Reported on 07/18/2022) 25 tablet 3   Current Facility-Administered Medications on File Prior to Visit  Medication Dose Route Frequency Provider Last Rate Last Admin   heparin lock flush 100 UNIT/ML injection             Allergies: No Known Allergies Past Medical History:  Past Medical History:  Diagnosis Date   Allergy    seasonal   Arthritis    all over- in general    CAD (coronary artery disease)    a. inferior wall MI 10/01 s/p PCI/DES to RCA; b. Myoview 3/16 neg for ischemia; c. LHC 8/16: ostLAD 80%, OM1 70%, OM2 70% x 2 lesions, mRCA 30%, dRCA 70% s/p 4-V CABG 01/24/15 (LIMA-LAD, VG- OM1, VG-OM2, VG-PDA)    Cancer (  HCC)    skin, melanoma   Carotid artery disease (HCC)    a. [place] 8/16: 1-39% bilateral ICA stenosis   Cataract    removed   Diastolic dysfunction    a. 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   stent RCA   Lung cancer (HCC)    Postoperative wound infection 02/02/2015   Reflux esophagitis    Sleep apnea 2017   CPAP at night   Past Surgical History:  Past Surgical History:  Procedure Laterality Date   arm surgery  2010   BROW LIFT Bilateral 11/25/2019   Procedure: BROW PTOSIS REPAIR BILATERAL;  Surgeon: Imagene Riches, MD;  Location: [facility];  Service: Ophthalmology;  Laterality: Bilateral;  sleep apnea   CARDIAC CATHETERIZATION  06/24/2011   CARDIAC CATHETERIZATION N/A 01/18/2015   Procedure: Left Heart Cath with coronary angiography;  Surgeon: Antonieta Iba, MD;  Location: [facility]AS[facility] CV LAB;  Service: Cardiovascular;   Laterality: N/A;   CARDIAC CATHETERIZATION N/A 01/18/2015   Procedure: Intravascular Pressure Wire/FFR Study;  Surgeon: Iran Ouch, MD;  Location: [facility]VAS[facility] CV LAB;  Service: Cardiovascular;  Laterality: N/A;   CAROTID STENT  03/10/2011   COLONOSCOPY  2010   COLONOSCOPY  06/14/2014   Dr Rhea Belton   COLONOSCOPY  01/2022   done for colon thickening at splenic flexure - 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[name])   CORONARY ARTERY BYPASS GRAFT N/A 01/24/2015   Procedure: CORONARY ARTERY BYPASS GRAFTING x 4 (LIMA-LAD, SVG-Int 1- Int 2, SVG-PD) ENDOSCOPIC GREATER SAPHENOUS VEIN HARVEST LEFT LEG;  Surgeon: Delight Ovens, MD;  Location: [facility] OR;  Service: Open Heart Surgery;  Laterality: N/A;   EMBOLECTOMY  06/15/2019   Procedure: EMBOLECTOMY;  Surgeon: Renford Dills, MD;  Location: [facility] ORS;  Service: Vascular;;  right superficial femoral artery   ENDARTERECTOMY FEMORAL Right 06/15/2019   Procedure: ENDARTERECTOMY FEMORAL;  Surgeon: Renford Dills, MD;  Location: [facility];  Service: Vascular;  Laterality: Right;  common femoral profunda femoris superficial femoral   ESOPHAGOGASTRODUODENOSCOPY (EGD) WITH PROPOFOL N/A 04/24/2016   Procedure: ESOPHAGOGASTRODUODENOSCOPY (EGD) WITH PROPOFOL;  Surgeon: Beverley Fiedler, MD;  Location: [facility];  Service: Gastroenterology;  Laterality: N/A;   EYE SURGERY     lasik 15 yrs. ago, cataracts removed - both eyes    HAMMER TOE SURGERY     right toe   INSERTION OF ILIAC STENT Right 06/15/2019   Procedure: INSERTION OF ILIAC STENT ( STENTING OF SFA/POP ARTERY );  Surgeon: Renford Dills, MD;  Location: [facility];  Service: Vascular;  Laterality: Right;  angioplpasty and stent placement: right superficial femoral right tibiopopliteal trunk bilateral common iliac arteries   IR IMAGING GUIDED PORT INSERTION  11/09/2020   LEFT HEART CATH AND CORONARY ANGIOGRAPHY Left 06/10/2017   Procedure: LEFT HEART CATH AND CORONARY ANGIOGRAPHY;   Surgeon: Antonieta Iba, MD;  Location: [facility];  Service: Cardiovascular;  Laterality: Left;   LOWER EXTREMITY ANGIOGRAPHY Left 01/04/2019   Procedure: LOWER EXTREMITY ANGIOGRAPHY;  Surgeon: Renford Dills, MD;  Location: [facility];  Service: Cardiovascular;  Laterality: Left;   LOWER EXTREMITY ANGIOGRAPHY Right 01/25/2019   Procedure: LOWER EXTREMITY ANGIOGRAPHY;  Surgeon: Renford Dills, MD;  [facility];  Service: Cardiovascular;  Laterality: Right;   LOWER EXTREMITY ANGIOGRAPHY Right 01/15/2021   LOWER EXTREMITY ANGIOGRAPHY and stent placement to R SFA and popliteal artery Gilda Crease, Latina Craver, MD)   NASAL SINUS SURGERY  2008   septpolasty, bilateral turbinate reduction   SHOULDER ARTHROSCOPY  2012   TEE WITHOUT CARDIOVERSION N/A 01/24/2015   Procedure: TRANSESOPHAGEAL ECHOCARDIOGRAM (TEE);  Surgeon: Delight Ovens, MD;  Location[facility];  Service: Open Heart Surgery;  Laterality: N/A;   TOE SURGERY  1994   UPPER GI ENDOSCOPY  07/2014, 04-24-16   Dr Margretta Sidle   WRIST SURGERY  2011   Social History:  Social History   Socioeconomic History   Marital status: Single    Spouse name: Not on file   Number of children: Not on file   Years of education: 12   Highest education level: Not on file  Occupational History   Occupation: retired    Comment: Pensions consultant  Tobacco Use   Smoking status: Former    Packs/day: 2.50    Years: 40.00    Additional pack years: 0.00    Total pack years: 100.00    Types: Cigarettes    Quit date: 03/24/2000    Years since quitting: 22.6   Smokeless tobacco: Never  Vaping Use   Vaping Use: Never used  Substance and Sexual Activity   Alcohol use: Yes    Comment: occassionally   Drug use: No   Sexual activity: Yes  Other Topics Concern   Not on file  Social History Narrative   Singled; lives with son and dog    Occ: retired, back part time at [employer];    Activity: gym 4-5d/wk   Diet: good  water, fruits/vegetables daily   Caffeine Use-yes      H/o Financial planner   ------------------------------------       Retail banker- retd; [employer] store- retd; quit smoking 2001. Alcohol couple nights a week. Live in [place]. Daughter lives 10 mins.    Social Determinants of Health   Financial Resource Strain: Low Risk  (09/26/2021)   Overall Financial Resource Strain (CARDIA)    Difficulty of Paying Living Expenses: Not hard at all  Food Insecurity: No Food Insecurity (02/14/2022)   Hunger Vital Sign    Worried About Running Out of Food in the Last Year: Never true    Ran Out of Food in the Last Year: Never true  Transportation Needs: No Transportation Needs (02/14/2022)   PRAPARE - Administrator, Civil Service (Medical): No    Lack of Transportation (Non-Medical): No  Physical Activity: Sufficiently Active (09/26/2021)   Exercise Vital Sign    Days of Exercise per Week: 5 days    Minutes of Exercise per Session: 30 min  Stress: No Stress Concern Present (09/26/2021)   [employer] of Occupational Health - Occupational Stress Questionnaire    Feeling of Stress : Only a little  Social Connections: Socially Isolated (09/26/2021)   Social Connection and Isolation Panel [NHANES]    Frequency of Communication with Friends and Family: Twice a week    Frequency of Social Gatherings with Friends and Family: Twice a week    Attends Religious Services: Never    Database administrator or Organizations: Not on file    Attends Banker Meetings: Never    Marital Status: Never married  Intimate Partner Violence: Not At Risk (02/14/2022)  Humiliation, Afraid, Rape, and Kick questionnaire    Fear of Current or Ex-Partner: No    Emotionally Abused: No    Physically Abused: No    Sexually Abused: No   Family History:  Family History  Problem Relation Age of [place]   Hypertension Mother    Heart disease Mother    Hypertension Father    Diabetes Father    Lymphoma  Sister    Heart disease Brother 54   Cancer Paternal Grandfather    Colon cancer Neg Hx    Prostate cancer Neg Hx    Bladder Cancer Neg Hx    Kidney cancer Neg Hx    Colon polyps Neg Hx    Esophageal cancer Neg Hx    Pancreatic cancer Neg Hx    Stomach cancer Neg Hx     Review of Systems: Constitutional: Doesn't report fevers, chills or abnormal weight loss Eyes: Doesn't report blurriness of vision Ears, nose, mouth, throat, and face: Doesn't report sore throat Respiratory: Doesn't report cough, dyspnea or wheezes Cardiovascular: Doesn't report palpitation, chest discomfort  Gastrointestinal:  Doesn't report nausea, constipation, diarrhea GU: Doesn't report incontinence Skin: Doesn't report skin rashes Neurological: Per HPI Musculoskeletal: Doesn't report joint pain Behavioral/Psych: Doesn't report anxiety  Physical Exam: Vitals:   10/31/22 0853  BP: (!) 123/53  Pulse: 64  Resp: 19  Temp: 97.6 F (36.4 C)  SpO2: 96%    KPS: 60. General: Alert, cooperative, pleasant, in no acute distress Head: Normal EENT: No conjunctival injection or scleral icterus.  Lungs: Resp effort normal Cardiac: Regular rate Abdomen: Non-distended abdomen Skin: No rashes cyanosis or petechiae. Extremities: No clubbing or edema  Neurologic Exam: Mental Status: Awake, alert, attentive to examiner. Oriented to self and environment. Language is fluent with intact comprehension.  Age advanced psychomotor slowing, marked anterograde amnesia.  Cranial Nerves: Visual acuity is grossly normal. Visual fields are full. Extra-ocular movements intact. No ptosis. Face is symmetric Motor: Tone and bulk are normal. Power is full in both arms and legs. Reflexes are symmetric, no pathologic reflexes present.  Sensory: Intact to light touch Gait: Normal.   Labs: I have reviewed the data as listed    Component Value Date/Time   NA 137 07/18/2022 1012   NA 138 01/15/2015 0956   K 4.1 07/18/2022 1012    CL 103 07/18/2022 1012   CO2 24 07/18/2022 1012   GLUCOSE 79 07/18/2022 1012   BUN 19 07/18/2022 1012   BUN 16 01/15/2015 0956   CREATININE 1.22 07/18/2022 1012   CALCIUM 9.3 07/18/2022 1012   PROT 7.1 07/18/2022 1012   ALBUMIN 4.2 07/18/2022 1012   AST 20 07/18/2022 1012   ALT 21 07/18/2022 1012   ALKPHOS 57 07/18/2022 1012   BILITOT 0.7 07/18/2022 1012   GFRNONAA 60 (L) 07/18/2022 1012   GFRAA >60 06/16/2019 1126   Lab Results  Component Value Date   WBC 6.7 07/18/2022   NEUTROABS 4.5 07/18/2022   HGB 15.8 07/18/2022   HCT 46.6 07/18/2022   MCV 90.1 07/18/2022   PLT 203 07/18/2022   Imaging:  [facility] Clinician Interpretation: I have personally reviewed the CNS images as listed.  My interpretation, in the context of the patient's clinical presentation, is stable disease  MR BRAIN W WO CONTRAST  Result Date: 10/31/2022 CLINICAL DATA:  Follow-up brain/CNS neoplasm. History of lung cancer. Medical record states previous herpes encephalitis. EXAM: MRI HEAD WITHOUT AND WITH CONTRAST TECHNIQUE: Multiplanar, multiecho pulse sequences of the brain and surrounding  structures were obtained without and with intravenous contrast. CONTRAST:  9mL GADAVIST GADOBUTROL 1 MMOL/ML IV SOLN COMPARISON:  MRI 05/05/2022 and 02/11/2022. FINDINGS: Brain: There is no longer any restricted diffusion on the examination. No focal abnormality affects the brainstem or cerebellum. The right cerebral hemisphere shows a stable pattern of chronic small vessel disease affecting the white matter. No sign of metastatic disease within the right cerebral hemisphere. On the left, there is chronic gliosis encephalomalacia the mesial temporal lobe and occipital lobe with no new or active features when compared to the study of last November. After contrast administration, there is no abnormal enhancement in that region. Other finding to suggest the development of intracranial metastatic disease. No hydrocephalus. No extra-axial  collection. Vascular: Major vessels at the base of the brain show flow. Skull and upper cervical spine: Negative Sinuses/Orbits: Clear/normal Other: None IMPRESSION: 1. No evidence of metastatic disease. 2. Chronic small-vessel ischemic changes of the cerebral hemispheric white matter. 3. Chronic gliosis and encephalomalacia of the mesial temporal lobe and occipital lobe on the left, presumably subsequent to previous herpes encephalitis by history and prior imaging. Electronically Signed   By: Paulina Fusi M.D.   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 Presents today for follow up after recent MRI brain.  He remains with dense anterograde amnesia, unchanged from prior.    MRI brain demonstrates stable findings when compared to prior studies.  Tension headaches are likely secondary to poor CPAP compliance.  He will reach out to pulmonology team re-assess CPAP equipment.    We recommended deferring further workup, given stable clinical and radiographic findings.  He may follow up as needed with new or progressive symptoms.   Prior: presents with clinical and radiographic syndrome consistent with HSV2 encephalitis, confirmed by CSF PCR.  He was treated with full course of valtrex, Vimpat was stopped due to lack of seizures.    Since discharge, he remains with static encephalopathy, worse from prior baseline.  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  He is not declining at this time.  Role of cancer and immunotherapy with regards to his encephalitis is unclear.  Headache has been persistent, tension type at this time.  Typically would have expected resolution at this point. No meningisumus clinically.  Analgesia overuse with standing Tylenol BID could be contributing at this stage to  rebound headache.  We recommended a short course of prednisone 50mg  daily x7 days, to help with headache and allow for gradual withdrawal of analgesia.  We would like to obtain an additional MRI brain for review given ongoing cognitive deficits, headaches.      We appreciate the opportunity to participate in the care of Randall Ali..   All questions were answered. The patient knows to call the clinic with any problems, questions or concerns. No barriers to learning were detected.  The total time spent in the encounter was 30 minutes and more than 50% was on counseling and review of test results   Henreitta Leber, MD Medical Director of Neuro-Oncology [facility] 10/31/22 9:05 AM

## 2022-11-05 ENCOUNTER — Encounter: Payer: Self-pay | Admitting: Family Medicine

## 2022-11-05 ENCOUNTER — Ambulatory Visit (INDEPENDENT_AMBULATORY_CARE_PROVIDER_SITE_OTHER): Payer: PPO | Admitting: Family Medicine

## 2022-11-05 VITALS — BP 124/66 | HR 69 | Temp 97.4°F | Ht 68.0 in | Wt 208.2 lb

## 2022-11-05 DIAGNOSIS — C3492 Malignant neoplasm of unspecified part of left bronchus or lung: Secondary | ICD-10-CM

## 2022-11-05 DIAGNOSIS — R1084 Generalized abdominal pain: Secondary | ICD-10-CM | POA: Diagnosis not present

## 2022-11-05 DIAGNOSIS — K5900 Constipation, unspecified: Secondary | ICD-10-CM

## 2022-11-05 DIAGNOSIS — R413 Other amnesia: Secondary | ICD-10-CM

## 2022-11-05 DIAGNOSIS — R5382 Chronic fatigue, unspecified: Secondary | ICD-10-CM | POA: Diagnosis not present

## 2022-11-05 LAB — VITAMIN B12: Vitamin B-12: 726 pg/mL (ref 211–911)

## 2022-11-05 LAB — CBC WITH DIFFERENTIAL/PLATELET
Basophils Absolute: 0 10*3/uL (ref 0.0–0.1)
Basophils Relative: 0.5 % (ref 0.0–3.0)
Eosinophils Absolute: 0.1 10*3/uL (ref 0.0–0.7)
Eosinophils Relative: 1.9 % (ref 0.0–5.0)
HCT: 46.2 % (ref 39.0–52.0)
Hemoglobin: 15.2 g/dL (ref 13.0–17.0)
Lymphocytes Relative: 19.7 % (ref 12.0–46.0)
Lymphs Abs: 1.4 10*3/uL (ref 0.7–4.0)
MCHC: 32.9 g/dL (ref 30.0–36.0)
MCV: 92.2 fl (ref 78.0–100.0)
Monocytes Absolute: 0.7 10*3/uL (ref 0.1–1.0)
Monocytes Relative: 9.8 % (ref 3.0–12.0)
Neutro Abs: 4.8 10*3/uL (ref 1.4–7.7)
Neutrophils Relative %: 68.1 % (ref 43.0–77.0)
Platelets: 214 10*3/uL (ref 150.0–400.0)
RBC: 5.01 Mil/uL (ref 4.22–5.81)
RDW: 14.4 % (ref 11.5–15.5)
WBC: 7.1 10*3/uL (ref 4.0–10.5)

## 2022-11-05 LAB — POC URINALSYSI DIPSTICK (AUTOMATED)
Bilirubin, UA: NEGATIVE
Blood, UA: NEGATIVE
Glucose, UA: NEGATIVE
Ketones, UA: NEGATIVE
Leukocytes, UA: NEGATIVE
Nitrite, UA: NEGATIVE
Protein, UA: NEGATIVE
Spec Grav, UA: 1.015 (ref 1.010–1.025)
Urobilinogen, UA: 0.2 E.U./dL
pH, UA: 6 (ref 5.0–8.0)

## 2022-11-05 LAB — COMPREHENSIVE METABOLIC PANEL
ALT: 14 U/L (ref 0–53)
AST: 16 U/L (ref 0–37)
Albumin: 4.5 g/dL (ref 3.5–5.2)
Alkaline Phosphatase: 61 U/L (ref 39–117)
BUN: 15 mg/dL (ref 6–23)
CO2: 26 mEq/L (ref 19–32)
Calcium: 10.2 mg/dL (ref 8.4–10.5)
Chloride: 103 mEq/L (ref 96–112)
Creatinine, Ser: 1.05 mg/dL (ref 0.40–1.50)
GFR: 66.96 mL/min (ref >60.00)
Glucose, Bld: 86 mg/dL (ref 70–99)
Potassium: 4 mEq/L (ref 3.5–5.1)
Sodium: 139 mEq/L (ref 135–145)
Total Bilirubin: 0.6 mg/dL (ref 0.2–1.2)
Total Protein: 7.5 g/dL (ref 6.0–8.3)

## 2022-11-05 LAB — VITAMIN D 25 HYDROXY (VIT D DEFICIENCY, FRACTURES): VITD: 68.62 ng/mL (ref 30.00–100.00)

## 2022-11-05 MED ORDER — POLYETHYLENE GLYCOL 3350 17 GM/SCOOP PO POWD: 8.5000 g | Freq: Every day | ORAL | 1 refills | Status: AC | PRN

## 2022-11-05 NOTE — Patient Instructions (Addendum)
Labs today Urinalysis today  Start miralax 1/2-1 capful daily as needed for constipation.  Let us know how you feel with this.  Work on hydration status.

## 2022-11-05 NOTE — Progress Notes (Unsigned)
Ph: [phone] Fax: [phone]   Patient ID: Randall Ali., male    DOB: 07-Sep-1940, 82 y.o.   MRN: [id number]  This visit was conducted in person.  BP 124/66   Pulse 69   Temp (!) 97.4 F (36.3 C) (Temporal)   Ht 5\' 8"  (1.727 m)   Wt 208 lb 4 oz (94.5 kg)   SpO2 93%   BMI 31.66 kg/m   BP Readings from Last 3 Encounters:  11/05/22 124/66  10/31/22 (!) 123/53  08/15/22 112/60  Orthostatic VS for the past 72 hrs (Last 3 readings):  Orthostatic BP Patient Position  11/05/22 0929 124/68 Standing  11/05/22 0926 (!) 142/92 Supine   CC: HTN f/u visit , dizziness Subjective:   HPI: Randall Ali. is a 82 y.o. male presenting on 11/05/2022 for Medical Management of Chronic Issues (Here for HTN f/u and dizziness- started 11/01/22. Pt accompanied by daughter, [name]. )   5d h/o 5-10 minute episodes of bilateral mid abdominal pain that travels up chest into head with associated dizziness. Happened 5-6 times in the past week.no relation to food. Appetite is good. Some nausea with this. Dizziness described as lightheadedness.  No morning headaches.  No fevers/chills, vomiting, diarrhea, or blood in stool or urine. No syncope or vertigo. No GERD. No dysuria, urgency or frequency. No cough, dyspnea, chest pain or palpitations.  Has treated with sitting and resting.  Continues pantoprazole 40mg  daily.   Notes some ongoing difficulty with constipation. This is despite colace 200mg  nightly. He notes sometimes goes days without BM but daughter feels he's having regular bowel movements.  Reviewed pan CT from 07/2022 - showing some constipation.   HTN - Compliant with current antihypertensive regimen of valsartan 40mg  daily. Does check blood pressures at home: good control. No low blood pressure readings or symptoms of syncope.  Denies HA, vision changes, CP/tightness, SOB, leg swelling.    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 He recommended follow up with pulm to ensure CPAP settings are adequate. Family notes persistent memory difficulty, possibly worsening.  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 Holding maintenance Keytruda at this time.      Relevant past medical, surgical, family and social history reviewed and updated as indicated. Interim medical history since our last visit reviewed. Allergies and medications reviewed and updated. Outpatient Medications Prior to Visit  Medication Sig Dispense Refill   acetaminophen (TYLENOL) 325 MG tablet Take 650 mg by mouth as needed for moderate pain or mild pain.     albuterol (VENTOLIN HFA) 108 (90 Base) MCG/ACT inhaler Inhale 2 puffs into the lungs every 6 (six) hours as needed for wheezing or shortness of breath. 18 g 2   Ascorbic Acid (VITAMIN C) 1000 MG tablet Take 1 tablet (1,000 mg total) by mouth daily.     aspirin 81 MG EC tablet Take 81 mg by mouth daily.       benzonatate (TESSALON) 200 MG capsule Take 1 capsule (200 mg total) by mouth 3 (three) times daily as needed for cough. 60 capsule 0   Calcium-Magnesium-Vitamin D (CALCIUM 1200+D3 PO) Take 1 tablet by mouth daily.     Carboxymethylcellul-Glycerin (LUBRICATING EYE DROPS OP) Place 1 drop into both eyes daily as needed (irritation).     cetirizine (  ZYRTEC) 10 MG chewable tablet Chew 10 mg by mouth daily.     Cholecalciferol (VITAMIN D3) 50 MCG (2000 UT) TABS Take 2,000 Units by mouth daily. 30 tablet    Cyanocobalamin (B-12) 5000 MCG CAPS Take 5,000 mcg by mouth daily.     docusate sodium (COLACE) 100 MG capsule Take 200 mg by mouth at bedtime.     finasteride (PROSCAR) 5 MG tablet TAKE 1 TABLET BY MOUTH DAILY 90 tablet 2   fluticasone (FLONASE) 50 MCG/ACT nasal spray Place 2 sprays into both nostrils daily.     guaiFENesin (MUCINEX) 600 MG 12  hr tablet Take by mouth 2 (two) times daily.     lidocaine-prilocaine (EMLA) cream Apply 30 -45 mins prior to port access. 30 g 3   Melatonin 10 MG CAPS Take 10 mg by mouth at bedtime as needed (sleep).     nitroGLYCERIN (NITROSTAT) 0.4 MG SL tablet Place 1 tablet (0.4 mg total) under the tongue every 5 (five) minutes as needed for chest pain. 25 tablet 3   pantoprazole (PROTONIX) 40 MG tablet Take 1 tablet (40 mg total) by mouth daily. 90 tablet 1   prochlorperazine (COMPAZINE) 10 MG tablet Take 1 tablet (10 mg total) by mouth every 6 (six) hours as needed for nausea or vomiting. 40 tablet 1   simvastatin (ZOCOR) 40 MG tablet Take 1 tablet (40 mg total) by mouth at bedtime. 90 tablet 3   valsartan (DIOVAN) 80 MG tablet Take 0.5 tablets (40 mg total) by mouth daily. One daily     Vitamin E 450 MG (1000 UT) CAPS Take 450 Units by mouth daily.     Zinc 50 MG TABS Take 50 mg by mouth daily.     Facility-Administered Medications Prior to Visit  Medication Dose Route Frequency Provider Last Rate Last Admin   heparin lock flush 100 UNIT/ML injection              Per HPI unless specifically indicated in ROS section below Review of Systems  Objective:  BP 124/66   Pulse 69   Temp (!) 97.4 F (36.3 C) (Temporal)   Ht 5\' 8"  (1.727 m)   Wt 208 lb 4 oz (94.5 kg)   SpO2 93%   BMI 31.66 kg/m   Wt Readings from Last 3 Encounters:  11/05/22 208 lb 4 oz (94.5 kg)  10/31/22 208 lb 12.8 oz (94.7 kg)  08/15/22 213 lb 3.2 oz (96.7 kg)      Physical Exam Vitals and nursing note reviewed.  Constitutional:      Appearance: Normal appearance. He is not ill-appearing.  HENT:     Mouth/Throat:     Mouth: Mucous membranes are moist.     Pharynx: Oropharynx is clear. No oropharyngeal exudate or posterior oropharyngeal erythema.  Eyes:     Extraocular Movements: Extraocular movements intact.     Pupils: Pupils are equal, round, and reactive to light.  Neck:     Thyroid: No thyroid mass or  thyromegaly.  Cardiovascular:     Rate and Rhythm: Normal rate and regular rhythm.     Pulses: Normal pulses.     Heart sounds: Normal heart sounds. No murmur heard. Pulmonary:     Effort: Pulmonary effort is normal. No respiratory distress.     Breath sounds: No wheezing, rhonchi or rales.     Comments: Coarse breath sounds L mid lung Abdominal:     General: Bowel sounds are normal. There is no distension.  Palpations: Abdomen is soft. There is no mass.     Tenderness: There is no abdominal tenderness. There is no guarding or rebound.     Hernia: No hernia is present.  Musculoskeletal:     Cervical back: Normal range of motion and neck supple.     Right lower leg: No edema.     Left lower leg: No edema.  Skin:    General: Skin is warm and dry.     Coloration: Skin is not jaundiced or pale.     Findings: No rash.  Neurological:     General: No focal deficit present.     Mental Status: He is alert.  Psychiatric:        Mood and Affect: Mood normal.        Behavior: Behavior normal.       Results for orders placed or performed in visit on 11/05/22  CBC with Differential/Platelet  Result Value Ref Range   WBC 7.1 4.0 - 10.5 K/uL   RBC 5.01 4.22 - 5.81 Mil/uL   Hemoglobin 15.2 13.0 - 17.0 g/dL   HCT 16.1 09.6 - 04.5 %   MCV 92.2 78.0 - 100.0 fl   MCHC 32.9 30.0 - 36.0 g/dL   RDW 40.9 81.1 - 91.4 %   Platelets 214.0 150.0 - 400.0 K/uL   Neutrophils Relative % 68.1 43.0 - 77.0 %   Lymphocytes Relative 19.7 12.0 - 46.0 %   Monocytes Relative 9.8 3.0 - 12.0 %   Eosinophils Relative 1.9 0.0 - 5.0 %   Basophils Relative 0.5 0.0 - 3.0 %   Neutro Abs 4.8 1.4 - 7.7 K/uL   Lymphs Abs 1.4 0.7 - 4.0 K/uL   Monocytes Absolute 0.7 0.1 - 1.0 K/uL   Eosinophils Absolute 0.1 0.0 - 0.7 K/uL   Basophils Absolute 0.0 0.0 - 0.1 K/uL  Comprehensive metabolic panel  Result Value Ref Range   Sodium 139 135 - 145 mEq/L   Potassium 4.0 3.5 - 5.1 mEq/L   Chloride 103 96 - 112 mEq/L   CO2  26 19 - 32 mEq/L   Glucose, Bld 86 70 - 99 mg/dL   BUN 15 6 - 23 mg/dL   Creatinine, Ser 7.82 0.40 - 1.50 mg/dL   Total Bilirubin 0.6 0.2 - 1.2 mg/dL   Alkaline Phosphatase 61 39 - 117 U/L   AST 16 0 - 37 U/L   ALT 14 0 - 53 U/L   Total Protein 7.5 6.0 - 8.3 g/dL   Albumin 4.5 3.5 - 5.2 g/dL   GFR 95.62 >13.08 mL/min   Calcium 10.2 8.4 - 10.5 mg/dL  Vitamin M57  Result Value Ref Range   Vitamin B-12 726 211 - 911 pg/mL  VITAMIN D 25 Hydroxy (Vit-D Deficiency, Fractures)  Result Value Ref Range   VITD 68.62 30.00 - 100.00 ng/mL  POCT Urinalysis Dipstick (Automated)  Result Value Ref Range   Color, UA yellow    Clarity, UA clear    Glucose, UA Negative Negative   Bilirubin, UA negative    Ketones, UA negative    Spec Grav, UA 1.015 1.010 - 1.025   Blood, UA negative    pH, UA 6.0 5.0 - 8.0   Protein, UA Negative Negative   Urobilinogen, UA 0.2 0.2 or 1.0 E.U./dL   Nitrite, UA negative    Leukocytes, UA Negative Negative   *Note: Due to a large number of results and/or encounters for the requested time period, some results have  not been displayed. A complete set of results can be found in Results Review.   Lab Results  Component Value Date   TSH 1.562 07/18/2022   Assessment & Plan:   Problem List Items Addressed This Visit     Fatigue    Check labs for reversible causes of fatigue.  Encouraged efforts towards good hydration.       Relevant Orders   Vitamin B12 (Completed)   VITAMIN D 25 Hydroxy (Vit-D Deficiency, Fractures) (Completed)   Generalized abdominal pain - Primary    Episodes of short lived abdominal pain with radiation up into chest and head associated with dizziness. Unclear etiology. Pt notes constipation - this could contribute. Will check labwork today, UA, treat possible constipation with starting miralax PRN, continue docusate.       Relevant Orders   CBC with Differential/Platelet (Completed)   Comprehensive metabolic panel (Completed)   POCT  Urinalysis Dipstick (Automated) (Completed)   NSCLC of left lung (HCC)    Appreciate onc and pulm care.       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  Sees Neuro -onc, recent MRI without acute change but did show persistent chronic small vessel ischemic changes and chronic gliosis/encephalomalacia of L mesial temporal and occipital lobes.       Constipation    He continues colace 200mg  daily.  Will add miralax 1/2-1 capful daily PRN constipation.  Encouraged good fiber and water intake.         Meds ordered this encounter  Medications   polyethylene glycol powder (GLYCOLAX/MIRALAX) 17 GM/SCOOP powder    Sig: Take 8.5-17 g by mouth daily as needed for moderate constipation.    Dispense:  510 g    Refill:  1    Orders Placed This Encounter  Procedures   CBC with Differential/Platelet   Comprehensive metabolic panel   Vitamin B12   VITAMIN D 25 Hydroxy (Vit-D Deficiency, Fractures)   POCT Urinalysis Dipstick (Automated)    Patient Instructions  Labs today Urinalysis today  Start miralax 1/2-1 capful daily as needed for constipation.  Let us know how you feel with this.  Work on hydration status.   Follow up plan: No follow-ups on file.  Eustaquio Boyden, MD

## 2022-11-06 DIAGNOSIS — K5900 Constipation, unspecified: Secondary | ICD-10-CM | POA: Insufficient documentation

## 2022-11-06 NOTE — Assessment & Plan Note (Addendum)
Check labs for reversible causes of fatigue.  Encouraged efforts towards good hydration.

## 2022-11-06 NOTE — Assessment & Plan Note (Signed)
Episodes of short lived abdominal pain with radiation up into chest and head associated with dizziness. Unclear etiology. Pt notes constipation - this could contribute. Will check labwork today, UA, treat possible constipation with starting miralax PRN, continue docusate.

## 2022-11-06 NOTE — Assessment & Plan Note (Addendum)
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  Sees Neuro -onc, recent MRI without acute change but did show persistent chronic small vessel ischemic changes and chronic gliosis/encephalomalacia of L mesial temporal and occipital lobes.

## 2022-11-06 NOTE — Assessment & Plan Note (Signed)
Appreciate onc and pulm care.

## 2022-11-06 NOTE — Assessment & Plan Note (Signed)
He continues colace 200mg  daily.  Will add miralax 1/2-1 capful daily PRN constipation.  Encouraged good fiber and water intake.

## 2022-11-13 ENCOUNTER — Other Ambulatory Visit: Payer: Self-pay | Admitting: Internal Medicine

## 2022-11-14 ENCOUNTER — Ambulatory Visit
Admission: RE | Admit: 2022-11-14 | Discharge: 2022-11-14 | Disposition: A | Discharge status: Home / Self Care | Payer: PPO | Source: Ambulatory Visit | Attending: Internal Medicine | Admitting: Internal Medicine

## 2022-11-14 DIAGNOSIS — C3432 Malignant neoplasm of lower lobe, left bronchus or lung: Secondary | ICD-10-CM | POA: Insufficient documentation

## 2022-11-14 DIAGNOSIS — C3490 Malignant neoplasm of unspecified part of unspecified bronchus or lung: Secondary | ICD-10-CM | POA: Diagnosis not present

## 2022-11-14 DIAGNOSIS — J439 Emphysema, unspecified: Secondary | ICD-10-CM | POA: Diagnosis not present

## 2022-11-14 DIAGNOSIS — I700 Atherosclerosis of aorta: Secondary | ICD-10-CM | POA: Diagnosis not present

## 2022-11-14 MED ORDER — IOHEXOL 300 MG/ML  SOLN
75.0000 mL | Freq: Once | INTRAMUSCULAR | Status: AC | PRN
  Administered 2022-11-14: 75 mL via INTRAVENOUS

## 2022-11-19 ENCOUNTER — Encounter: Payer: Self-pay | Admitting: Internal Medicine

## 2022-11-19 ENCOUNTER — Inpatient Hospital Stay: Payer: PPO | Attending: Internal Medicine

## 2022-11-19 ENCOUNTER — Inpatient Hospital Stay (HOSPITAL_BASED_OUTPATIENT_CLINIC_OR_DEPARTMENT_OTHER): Payer: PPO | Admitting: Internal Medicine

## 2022-11-19 VITALS — BP 117/68 | HR 73 | Temp 98.8°F | Ht 68.0 in | Wt 208.8 lb

## 2022-11-19 DIAGNOSIS — Z87891 Personal history of nicotine dependence: Secondary | ICD-10-CM | POA: Insufficient documentation

## 2022-11-19 DIAGNOSIS — Z79899 Other long term (current) drug therapy: Secondary | ICD-10-CM | POA: Insufficient documentation

## 2022-11-19 DIAGNOSIS — I2510 Atherosclerotic heart disease of native coronary artery without angina pectoris: Secondary | ICD-10-CM | POA: Diagnosis not present

## 2022-11-19 DIAGNOSIS — Z9221 Personal history of antineoplastic chemotherapy: Secondary | ICD-10-CM | POA: Diagnosis not present

## 2022-11-19 DIAGNOSIS — Z955 Presence of coronary angioplasty implant and graft: Secondary | ICD-10-CM | POA: Insufficient documentation

## 2022-11-19 DIAGNOSIS — Z7902 Long term (current) use of antithrombotics/antiplatelets: Secondary | ICD-10-CM | POA: Insufficient documentation

## 2022-11-19 DIAGNOSIS — Z7982 Long term (current) use of aspirin: Secondary | ICD-10-CM | POA: Diagnosis not present

## 2022-11-19 DIAGNOSIS — N2889 Other specified disorders of kidney and ureter: Secondary | ICD-10-CM | POA: Diagnosis not present

## 2022-11-19 DIAGNOSIS — Z951 Presence of aortocoronary bypass graft: Secondary | ICD-10-CM | POA: Insufficient documentation

## 2022-11-19 DIAGNOSIS — C3432 Malignant neoplasm of lower lobe, left bronchus or lung: Secondary | ICD-10-CM | POA: Diagnosis not present

## 2022-11-19 LAB — COMPREHENSIVE METABOLIC PANEL WITH GFR
ALT: 15 U/L (ref 0–44)
AST: 18 U/L (ref 15–41)
Albumin: 4.2 g/dL (ref 3.5–5.0)
Alkaline Phosphatase: 51 U/L (ref 38–126)
Anion gap: 7 (ref 5–15)
BUN: 13 mg/dL (ref 8–23)
CO2: 25 mmol/L (ref 22–32)
Calcium: 8.3 mg/dL — ABNORMAL LOW (ref 8.9–10.3)
Chloride: 108 mmol/L (ref 98–111)
Creatinine, Ser: 0.97 mg/dL (ref 0.61–1.24)
GFR, Estimated: >60 mL/min
Glucose, Bld: 115 mg/dL — ABNORMAL HIGH (ref 70–99)
Potassium: 4 mmol/L (ref 3.5–5.1)
Sodium: 140 mmol/L (ref 135–145)
Total Bilirubin: 0.7 mg/dL (ref 0.3–1.2)
Total Protein: 6.9 g/dL (ref 6.5–8.1)

## 2022-11-19 LAB — CBC WITH DIFFERENTIAL/PLATELET
Abs Immature Granulocytes: 0.03 10*3/uL (ref 0.00–0.07)
Basophils Absolute: 0 10*3/uL (ref 0.0–0.1)
Basophils Relative: 1 %
Eosinophils Absolute: 0.1 10*3/uL (ref 0.0–0.5)
Eosinophils Relative: 2 %
HCT: 44.1 % (ref 39.0–52.0)
Hemoglobin: 15 g/dL (ref 13.0–17.0)
Immature Granulocytes: 1 %
Lymphocytes Relative: 24 %
Lymphs Abs: 1.4 10*3/uL (ref 0.7–4.0)
MCH: 30.7 pg (ref 26.0–34.0)
MCHC: 34 g/dL (ref 30.0–36.0)
MCV: 90.2 fL (ref 80.0–100.0)
Monocytes Absolute: 0.5 10*3/uL (ref 0.1–1.0)
Monocytes Relative: 8 %
Neutro Abs: 3.7 10*3/uL (ref 1.7–7.7)
Neutrophils Relative %: 64 %
Platelets: 196 10*3/uL (ref 150–400)
RBC: 4.89 MIL/uL (ref 4.22–5.81)
RDW: 13.2 % (ref 11.5–15.5)
WBC: 5.7 10*3/uL (ref 4.0–10.5)
nRBC: 0 % (ref 0.0–0.2)

## 2022-11-19 LAB — TSH: TSH: 1.897 u[IU]/mL (ref 0.350–4.500)

## 2022-11-19 NOTE — Assessment & Plan Note (Addendum)
#   [facility]- Lung cancer-non-small cell stage IV [right suprarenal nodule-s/p biopsy "non-small cell ca"].  S/p  CarboTaxol plus Keytruda x4; currently on single agent Keytruda on HOLD; Last keytruda was July, 27th, 2023- CT JUNE 10th- 2024-  No evidence of recurrent or metastatic disease in the chest.  Interval resolution of patchy upper lobe ground-glass opacities; see below re: incidental kidney mass  # Last Rande Lawman was July, 27th, 2023-  Discussed that it is quite possible that patient's encephalitis could be result of immunosuppression from chemotherapy/steroids/and also immunotherapy.  Given overall stable disease on the I think it is okay to hold further therapy at this time.  Continue surveillance imaging in 6  months.   #  incidental CT June 2024- 1.3. Hyperattenuating exophytic lesion of the upper pole of the right kidney is new when compared with Oct 08, 2020 prior and concerning for a small RCC. Consider contrast-enhanced abdominal MRI for definitive characterization. Reluctant    # CT scan FEB 2024- Peripheral and basilar predominant ground-glass and subpleural reticular densities, increased from 01/11/2022. CT scan June 2024-  Resolved-monitor for now.   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  Likely contributing to patient's cognitive deficits- [Dr.vaslow] stable.   # CAD-s/p CABG-/PVD-s/p stent [Dr.Schneir]-improvement in pain noted.  On Plavix -stable.   # Constipation: on miralax as needed.   # IV access: Mediport functioning.  #Incidental findings on Imaging  CT , June 2024: Aortic Atherosclerosis ; Emphysema   I reviewed/discussed/counseled the patient.   I spoke at length with the patient's  daughter, Kim/ son- regarding the patient's clinical status/plan of care.  Family agreement.   # DISPOSITION: # port flush in 3 months # follow up in  6 months with MD; port/ labs-cbc/cmp;TSH- No chemo; CT CAP-- Dr.B  # I reviewed the blood work- with the patient in detail; also reviewed the imaging independently [as summarized above]; and with the patient in detail.

## 2022-11-19 NOTE — Progress Notes (Signed)
[facility] CONSULT NOTE  Patient Care Team: Eustaquio Boyden, MD as PCP - General (Family Medicine) Mariah Milling Tollie Pizza, MD as PCP - Cardiology (Cardiology) Sherlene Shams, MD (Internal Medicine) Antonieta Iba, MD as Consulting Physician (Cardiology) Drema Dallas, DO as Consulting Physician (Neurology) Earna Coder, MD as Consulting Physician (Hematology and Oncology) Glory Buff, RN as Oncology Nurse Navigator  CHIEF COMPLAINTS/PURPOSE OF CONSULTATION: lung cancer    Oncology History Overview Note  #MAY 2022-Lung cancer-non-small cell [CT guided bx] T3N1 vs stage IV Dr.Hendrickson.  MRI brain negative for malignancy.# 1. April 2022- LLL ~4.0 cm mass in the superior segment left lower lobe abuts the major fissure and the posterior pleural surface without visible chest wall invasion without pleural effusion. There 2-3 other small nodules in the left lower lobe, largest measures 9 mm in diameter. These are suspicious for same lobe satellite lesions and there is left hilar adenopathy. Assuming non-small cell lung cancer the appearance is compatible with T3 N1 M0 disease (stage IIIA).  # right suprarenal nodule-awaiting biopsy on 5/31- non-small cell [revived at tumor conference at [facility]]  # June 9th 2022- CARBO-TAXOl-KEYTRUDA; Fulphila  # ? Subtle adrenal insufficiency- No acute process - -September MRI brain negative for any pituitary hypophysitis/brain metastasis. Prednisone   MOLECULAR TESTING: NGS TPS-PDL 100%; EXON 12 amplification* mutations.  # CAD [CABG 2016; Dr.Gollan]   Cancer of lower lobe of left lung (HCC)  11/02/2020 Initial Diagnosis   Cancer of lower lobe of left lung (HCC)   11/02/2020 Cancer Staging   Staging form: Lung, AJCC 8th Edition - Clinical: Stage IVA (cT3, cN1, cM1a) - Signed by Earna Coder, MD on 11/02/2020   11/16/2020 - 01/02/2022 Chemotherapy   Patient is on Treatment Plan : LUNG NSCLC Carboplatin + Paclitaxel +  Pembrolizumab q21d x 4 cycles / Pembrolizumab Maintenance Q21D     11/16/2020 -  Chemotherapy   Patient is on Treatment Plan : LUNG NSCLC Carboplatin (6) + Paclitaxel (200) + Pembrolizumab (200) D1 q21d x 4 cycles / Pembrolizumab (200) Maintenance D1 q21d      HISTORY OF PRESENTING ILLNESS: Accompanied by son. Walking independently.  Randall Ali. 82 y.o.  male lung cancer-non-small cell stage IV [supra-renal nodule s/p biopsy] currently on Keytruda is for follow-up/review results of the MRI brain.  Randall Ali is on hold since July 2023 because of multiple hospitalizations/HSV encephalitis etc.  Daughter concerned with pts "gas" smelling like sewage.  No constipation or diarrhea or abdominal pain.   Denies any worsening cough. NO new shortness of breath. However,  patient continues to have intermittent cognitive deficits.  He is currently living with his daughter in [place].     Review of Systems  Constitutional:  Positive for malaise/fatigue. Negative for chills, diaphoresis, fever and weight loss.  HENT:  Negative for nosebleeds and sore throat.   Eyes:  Negative for double vision.  Respiratory:  Negative for cough, hemoptysis, sputum production, shortness of breath and wheezing.   Cardiovascular:  Negative for chest pain, palpitations, orthopnea and leg swelling.  Gastrointestinal:  Negative for abdominal pain, blood in stool, constipation, diarrhea, heartburn, melena and vomiting.  Genitourinary:  Negative for dysuria, frequency and urgency.  Musculoskeletal:  Positive for back pain and joint pain.  Skin: Negative.  Negative for itching and rash.  Neurological:  Positive for dizziness and tremors. Negative for tingling, focal weakness, weakness and headaches.  Endo/Heme/Allergies:  Does not bruise/bleed easily.  Psychiatric/Behavioral:  Positive for memory loss.  Negative for depression. The patient is not nervous/anxious and does not have insomnia.      MEDICAL HISTORY:  Past  Medical History:  Diagnosis Date   Allergy    seasonal   Arthritis    all over- in general    CAD (coronary artery disease)    a. inferior wall MI 10/01 s/p PCI/DES to RCA; b. Myoview 3/16 neg for ischemia; c. LHC 8/16: ostLAD 80%, OM1 70%, OM2 70% x 2 lesions, mRCA 30%, dRCA 70% s/p 4-V CABG 01/24/15 (LIMA-LAD, VG- OM1, VG-OM2, VG-PDA)    Cancer (HCC)    skin, melanoma   Carotid artery disease (HCC)    a. [place] 8/16: 1-39% bilateral ICA stenosis   Cataract    removed   Diastolic dysfunction    a. 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   stent RCA   Lung cancer (HCC)    Postoperative wound infection 02/02/2015   Reflux esophagitis    Sleep apnea 2017   CPAP at night    SURGICAL HISTORY: Past Surgical History:  Procedure Laterality Date   arm surgery  2010   BROW LIFT Bilateral 11/25/2019   Procedure: BROW PTOSIS REPAIR BILATERAL;  Surgeon: Imagene Riches, MD;  Location: [facility];  Service: Ophthalmology;  Laterality: Bilateral;  sleep apnea   CARDIAC CATHETERIZATION  06/24/2011   CARDIAC CATHETERIZATION N/A 01/18/2015   Procedure: Left Heart Cath with coronary angiography;  Surgeon: Antonieta Iba, MD;  Location: [facility] IN[facility];  Service: Cardiovascular;  Laterality: N/A;   CARDIAC CATHETERIZATION N/A 01/18/2015   Procedure: Intravascular Pressure Wire/FFR Study;  Surgeon: Iran Ouch, MD;  Location: [facility] INVAS[facility] CV LAB;  Service: Cardiovascular;  Laterality: N/A;   CAROTID STENT  03/10/2011   COLONOSCOPY  2010   COLONOSCOPY  06/14/2014   Dr Rhea Belton   COLONOSCOPY  01/2022   done for colon thickening at splenic flexure - 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[name])   CORONARY ARTERY  BYPASS GRAFT N/A 01/24/2015   Procedure: CORONARY ARTERY BYPASS GRAFTING x 4 (LIMA-LAD, SVG-Int 1- Int 2, SVG-PD) ENDOSCOPIC GREATER SAPHENOUS VEIN HARVEST LEFT LEG;  Surgeon: Delight Ovens, MD;  Location: [facility] OR;  Service: Open Heart Surgery;  Laterality: N/A;   EMBOLECTOMY  06/15/2019   Procedure: EMBOLECTOMY;  Surgeon: Renford Dills, MD;  Location: [facility] ORS;  Service: Vascular;;  right superficial femoral artery   ENDARTERECTOMY FEMORAL Right 06/15/2019   Procedure: ENDARTERECTOMY FEMORAL;  Surgeon: Renford Dills, MD;  Location: [facility] ORS;  Service: Vascular;  Laterality: Right;  common femoral profunda femoris superficial femoral   ESOPHAGOGASTRODUODENOSCOPY (EGD) WITH PROPOFOL N/A 04/24/2016   Procedure: ESOPHAGOGASTRODUODENOSCOPY (EGD) WITH PROPOFOL;  Surgeon: Beverley Fiedler, MD;  Location: [facility];  Service: Gastroenterology;  Laterality: N/A;   EYE SURGERY     lasik 15 yrs. ago, cataracts removed - both eyes    HAMMER TOE SURGERY     right toe   INSERTION OF ILIAC STENT Right 06/15/2019   Procedure: INSERTION OF ILIAC STENT ( STENTING OF SFA/POP ARTERY );  Surgeon: Renford Dills, MD;  Location: [facility] ORS;  Service: Vascular;  Laterality: Right;  angioplpasty and stent placement: right superficial femoral  right tibiopopliteal trunk bilateral common iliac arteries   IR IMAGING GUIDED PORT INSERTION  11/09/2020   LEFT HEART CATH AND CORONARY ANGIOGRAPHY Left 06/10/2017   Procedure: LEFT HEART CATH AND CORONARY ANGIOGRAPHY;  Surgeon: Antonieta Iba, MD;  Location: [facility];  Service: Cardiovascular;  Laterality: Left;   LOWER EXTREMITY ANGIOGRAPHY Left 01/04/2019   Procedure: LOWER EXTREMITY ANGIOGRAPHY;  Surgeon: Renford Dills, MD;  Location: [facility];  Service: Cardiovascular;  Laterality: Left;   LOWER EXTREMITY ANGIOGRAPHY Right 01/25/2019   Procedure: LOWER EXTREMITY ANGIOGRAPHY;  Surgeon: Renford Dills, MD;  Location: [facility]  INVASIVE CV LAB;  Service: Cardiovascular;  Laterality: Right;   LOWER EXTREMITY ANGIOGRAPHY Right 01/15/2021   LOWER EXTREMITY ANGIOGRAPHY and stent placement to R SFA and popliteal artery Gilda Crease, Latina Craver, MD)   NASAL SINUS SURGERY  2008   septpolasty, bilateral turbinate reduction   SHOULDER ARTHROSCOPY  2012   TEE WITHOUT CARDIOVERSION N/A 01/24/2015   Procedure: TRANSESOPHAGEAL ECHOCARDIOGRAM (TEE);  Surgeon: Delight Ovens, MD;  Location: [facility] OR;  Service: Open Heart Surgery;  Laterality: N/A;   TOE SURGERY  1994   UPPER GI ENDOSCOPY  07/2014, 04-24-16   Dr Margretta Sidle   WRIST SURGERY  2011    SOCIAL HISTORY: Social History   Socioeconomic History   Marital status: Single    Spouse name: Not on file   Number of children: Not on file   Years of education: 12   Highest education level: 12th grade  Occupational History   Occupation: retired    Comment: Pensions consultant  Tobacco Use   Smoking status: Former    Packs/day: 2.50    Years: 40.00    Additional pack years: 0.00    Total pack years: 100.00    Types: Cigarettes    Quit date: 03/24/2000    Years since quitting: 22.6   Smokeless tobacco: Never  Vaping Use   Vaping Use: Never used  Substance and Sexual Activity   Alcohol use: Yes    Comment: occassionally   Drug use: No   Sexual activity: Yes  Other Topics Concern   Not on file  Social History Narrative   Singled; lives with son and dog    Occ: retired, back part time at [employer];    Activity: gym 4-5d/wk   Diet: good water, fruits/vegetables daily   Caffeine Use-yes      H/o Financial planner   ------------------------------------       Retail banker- retd; [employer] store- retd; quit smoking 2001. Alcohol couple nights a week. Live in [place]. Daughter lives 10 mins.    Social Determinants of Health   Financial Resource Strain: Low Risk  (11/04/2022)   Overall Financial Resource Strain (CARDIA)    Difficulty of Paying Living Expenses: Not hard at all  Food  Insecurity: No Food Insecurity (11/04/2022)   Hunger Vital Sign    Worried About Running Out of Food in the Last Year: Never true    Ran Out of Food in the Last Year: Never true  Transportation Needs: No Transportation Needs (11/04/2022)   PRAPARE - Administrator, Civil Service (Medical): No    Lack of Transportation (Non-Medical): No  Physical Activity: Insufficiently Active (11/04/2022)   Exercise Vital Sign    Days of Exercise per Week: 5 days    Minutes of Exercise per Session: 10 min  Stress: No Stress Concern Present (11/04/2022)   [employer] of Occupational Health - Occupational  Stress Questionnaire    Feeling of Stress : Not at all  Social Connections: Socially Isolated (11/04/2022)   Social Connection and Isolation Panel [NHANES]    Frequency of Communication with Friends and Family: Once a week    Frequency of Social Gatherings with Friends and Family: Once a week    Attends Religious Services: More than 4 times per year    Active Member of [employer] or Organizations: No    Attends Engineer, structural: Not on file    Marital Status: Divorced  Intimate Partner Violence: Not At Risk (02/14/2022)   Humiliation, Afraid, Rape, and Kick questionnaire    Fear of Current or Ex-Partner: No    Emotionally Abused: No    Physically Abused: No    Sexually Abused: No    FAMILY HISTORY: Family History  Problem Relation Age of Onset   Hypertension Mother    Heart disease Mother    Hypertension Father    Diabetes Father    Lymphoma Sister    Heart disease Brother 11   Cancer Paternal Grandfather    Colon cancer Neg Hx    Prostate cancer Neg Hx    Bladder Cancer Neg Hx    Kidney cancer Neg Hx    Colon polyps Neg Hx    Esophageal cancer Neg Hx    Pancreatic cancer Neg Hx    Stomach cancer Neg Hx     ALLERGIES:  has No Known Allergies.  MEDICATIONS:  Current Outpatient Medications  Medication Sig Dispense Refill   acetaminophen (TYLENOL) 325 MG tablet  Take 650 mg by mouth as needed for moderate pain or mild pain.     albuterol (VENTOLIN HFA) 108 (90 Base) MCG/ACT inhaler Inhale 2 puffs into the lungs every 6 (six) hours as needed for wheezing or shortness of breath. 18 g 2   Ascorbic Acid (VITAMIN C) 1000 MG tablet Take 1 tablet (1,000 mg total) by mouth daily.     aspirin 81 MG EC tablet Take 81 mg by mouth daily.       benzonatate (TESSALON) 200 MG capsule Take 1 capsule (200 mg total) by mouth 3 (three) times daily as needed for cough. 60 capsule 0   Calcium-Magnesium-Vitamin D (CALCIUM 1200+D3 PO) Take 1 tablet by mouth daily.     Carboxymethylcellul-Glycerin (LUBRICATING EYE DROPS OP) Place 1 drop into both eyes daily as needed (irritation).     cetirizine (ZYRTEC) 10 MG chewable tablet Chew 10 mg by mouth daily.     Cholecalciferol (VITAMIN D3) 50 MCG (2000 UT) TABS Take 2,000 Units by mouth daily. 30 tablet    Cyanocobalamin (B-12) 5000 MCG CAPS Take 5,000 mcg by mouth daily.     docusate sodium (COLACE) 100 MG capsule Take 200 mg by mouth at bedtime.     finasteride (PROSCAR) 5 MG tablet TAKE 1 TABLET BY MOUTH DAILY 90 tablet 2   fluticasone (FLONASE) 50 MCG/ACT nasal spray Place 2 sprays into both nostrils daily.     guaiFENesin (MUCINEX) 600 MG 12 hr tablet Take by mouth 2 (two) times daily.     lidocaine-prilocaine (EMLA) cream Apply 30 -45 mins prior to port access. 30 g 3   Melatonin 10 MG CAPS Take 10 mg by mouth at bedtime as needed (sleep).     nitroGLYCERIN (NITROSTAT) 0.4 MG SL tablet Place 1 tablet (0.4 mg total) under the tongue every 5 (five) minutes as needed for chest pain. 25 tablet 3   pantoprazole (PROTONIX) 40 MG  tablet Take 1 tablet (40 mg total) by mouth daily. 90 tablet 1   polyethylene glycol powder (GLYCOLAX/MIRALAX) 17 GM/SCOOP powder Take 8.5-17 g by mouth daily as needed for moderate constipation. 510 g 1   prochlorperazine (COMPAZINE) 10 MG tablet Take 1 tablet (10 mg total) by mouth every 6 (six) hours as  needed for nausea or vomiting. 40 tablet 1   simvastatin (ZOCOR) 40 MG tablet Take 1 tablet (40 mg total) by mouth at bedtime. 90 tablet 3   valsartan (DIOVAN) 80 MG tablet TAKE 1 TABLET BY MOUTH DAILY 30 tablet 0   Vitamin E 450 MG (1000 UT) CAPS Take 450 Units by mouth daily.     Zinc 50 MG TABS Take 50 mg by mouth daily.     No current facility-administered medications for this visit.   Facility-Administered Medications Ordered in Other Visits  Medication Dose Route Frequency Provider Last Rate Last Admin   heparin lock flush 100 UNIT/ML injection               .  PHYSICAL EXAMINATION: ECOG PERFORMANCE STATUS: 0 - Asymptomatic  Vitals:   11/19/22 1000  BP: 117/68  Pulse: 73  Temp: 98.8 F (37.1 C)  SpO2: 95%      Filed Weights   11/19/22 1000  Weight: 208 lb 12.8 oz (94.7 kg)        Physical Exam HENT:     Head: Normocephalic and atraumatic.     Mouth/Throat:     Pharynx: No oropharyngeal exudate.  Eyes:     Pupils: Pupils are equal, round, and reactive to light.  Cardiovascular:     Rate and Rhythm: Normal rate and regular rhythm.  Pulmonary:     Effort: No respiratory distress.     Breath sounds: No wheezing.     Comments: Decreased air entry bilaterally. Abdominal:     General: Bowel sounds are normal. There is no distension.     Palpations: Abdomen is soft. There is no mass.     Tenderness: There is no abdominal tenderness. There is no guarding or rebound.  Musculoskeletal:        General: No tenderness. Normal range of motion.     Cervical back: Normal range of motion and neck supple.  Skin:    General: Skin is warm.  Neurological:     Mental Status: He is alert and oriented to person, place, and time.  Psychiatric:        Mood and Affect: Affect normal.      LABORATORY DATA:  I have reviewed the data as listed Lab Results  Component Value Date   WBC 5.7 11/19/2022   HGB 15.0 11/19/2022   HCT 44.1 11/19/2022   MCV 90.2 11/19/2022    PLT 196 11/19/2022   Recent Labs    05/09/22 1022 07/18/22 1012 11/05/22 1021 11/19/22 0948  NA 138 137 139 140  K 3.6 4.1 4.0 4.0  CL 106 103 103 108  CO2 23 24 26 25   GLUCOSE 137* 79 86 115*  BUN 16 19 15 13   CREATININE 0.94 1.22 1.05 0.97  CALCIUM 9.2 9.3 10.2 8.3*  GFRNONAA >60 60*  --  >60  PROT 6.9 7.1 7.5 6.9  ALBUMIN 3.9 4.2 4.5 4.2  AST 19 20 16 18   ALT 21 21 14 15   ALKPHOS 56 57 61 51  BILITOT 0.7 0.7 0.6 0.7    RADIOGRAPHIC STUDIES: I have personally reviewed the radiological images as listed and agreed  with the findings in the report. CT CHEST W CONTRAST  Result Date: 11/19/2022 CLINICAL DATA:  Non-small cell lung cancer; * Tracking Code: BO * EXAM: CT CHEST WITH CONTRAST TECHNIQUE: Multidetector CT imaging of the chest was performed during intravenous contrast administration. RADIATION DOSE REDUCTION: This exam was performed according to the departmental dose-optimization program which includes automated exposure control, adjustment of the mA and/or kV according to patient size and/or use of iterative reconstruction technique. CONTRAST:  75mL OMNIPAQUE IOHEXOL 300 MG/ML  SOLN COMPARISON:  CT chest, abdomen and pelvis dated July 15, 2022; chest CT dated Oct 08, 2020 FINDINGS: Cardiovascular: Mild cardiomegaly. No pericardial effusion. Normal caliber thoracic aorta moderate atherosclerotic disease. Severe coronary artery calcifications status post CABG. Right chest wall port with tip in the SVC. Mediastinum/Nodes: Esophagus and thyroid are unremarkable. No enlarged lymph nodes seen in the chest. Lungs/Pleura: Central airways are patent. Interval resolution of patchy upper lobe ground-glass opacities. Mild centrilobular emphysema. Stable posttreatment scarring of the superior left lower lobe. Calcified granuloma of the right upper lobe. Similar mild bibasilar opacities which are likely due to scarring or atelectasis. No pleural effusion. Upper Abdomen: Stable low-attenuation  liver lesions, likely simple cysts. Hyperattenuating exophytic lesion of the upper pole of the right kidney measuring 1.2 cm series 2, image 143, unchanged when compared with most recent prior, but new compared with Oct 08, 2020 prior. Musculoskeletal: Prior median sternotomy. No acute or significant osseous findings. IMPRESSION: 1. No evidence of recurrent or metastatic disease in the chest. 2. Interval resolution of patchy upper lobe ground-glass opacities. 3. Hyperattenuating exophytic lesion of the upper pole of the right kidney is new when compared with Oct 08, 2020 prior and concerning for a small RCC. Consider contrast-enhanced abdominal MRI for definitive characterization. 4. Aortic Atherosclerosis (ICD10-I70.0) and Emphysema (ICD10-J43.9). Electronically Signed   By: Allegra Lai M.D.   On: 11/19/2022 09:23   MR BRAIN W WO CONTRAST  Result Date: 10/31/2022 CLINICAL DATA:  Follow-up brain/CNS neoplasm. History of lung cancer. Medical record states previous herpes encephalitis. EXAM: MRI HEAD WITHOUT AND WITH CONTRAST TECHNIQUE: Multiplanar, multiecho pulse sequences of the brain and surrounding structures were obtained without and with intravenous contrast. CONTRAST:  9mL GADAVIST GADOBUTROL 1 MMOL/ML IV SOLN COMPARISON:  MRI 05/05/2022 and 02/11/2022. FINDINGS: Brain: There is no longer any restricted diffusion on the examination. No focal abnormality affects the brainstem or cerebellum. The right cerebral hemisphere shows a stable pattern of chronic small vessel disease affecting the white matter. No sign of metastatic disease within the right cerebral hemisphere. On the left, there is chronic gliosis encephalomalacia the mesial temporal lobe and occipital lobe with no new or active features when compared to the study of last November. After contrast administration, there is no abnormal enhancement in that region. Other finding to suggest the development of intracranial metastatic disease. No  hydrocephalus. No extra-axial collection. Vascular: Major vessels at the base of the brain show flow. Skull and upper cervical spine: Negative Sinuses/Orbits: Clear/normal Other: None IMPRESSION: 1. No evidence of metastatic disease. 2. Chronic small-vessel ischemic changes of the cerebral hemispheric white matter. 3. Chronic gliosis and encephalomalacia of the mesial temporal lobe and occipital lobe on the left, presumably subsequent to previous herpes encephalitis by history and prior imaging. Electronically Signed   By: Paulina Fusi M.D.   On: 10/31/2022 08:03     ASSESSMENT & PLAN:   Cancer of lower lobe of left lung (HCC) # LLL- Lung cancer-non-small cell stage IV [right suprarenal  nodule-s/p biopsy "non-small cell ca"].  S/p  CarboTaxol plus Keytruda x4; currently on single agent Keytruda on HOLD; Last keytruda was July, 27th, 2023- CT JUNE 10th- 2024-  No evidence of recurrent or metastatic disease in the chest.  Interval resolution of patchy upper lobe ground-glass opacities; see below re: incidental kidney mass  # Last Randall Ali was July, 27th, 2023-  Discussed that it is quite possible that patient's encephalitis could be result of immunosuppression from chemotherapy/steroids/and also immunotherapy.  Given overall stable disease on the I think it is okay to hold further therapy at this time.  Continue surveillance imaging in 6  months.   #  incidental CT June 2024- 1.3. Hyperattenuating exophytic lesion of the upper pole of the right kidney is new when compared with Oct 08, 2020 prior and concerning for a small RCC. Consider contrast-enhanced abdominal MRI for definitive characterization. Reluctant    # CT scan FEB 2024- Peripheral and basilar predominant ground-glass and subpleural reticular densities, increased from 01/11/2022. CT scan June 2024-  Resolved-monitor for now.   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  Likely contributing to patient's cognitive deficits- [Dr.vaslow] stable.   # CAD-s/p CABG-/PVD-s/p stent [Dr.Schneir]-improvement in pain noted.  On Plavix -stable.   # Constipation: on miralax as needed.   # IV access: Mediport functioning.  #Incidental findings on Imaging  CT , June 2024: Aortic Atherosclerosis ; Emphysema   I reviewed/discussed/counseled the patient.   I spoke at length with the patient's  daughter, Kim/ son- regarding the patient's clinical status/plan of care.  Family agreement.   # DISPOSITION: # port flush in 3 months # follow up in 6 months with MD; port/ labs-cbc/cmp;TSH- No chemo; CT CAP-- Dr.B  # I reviewed the blood work- with the patient in detail; also reviewed the imaging independently [as summarized above]; and with the patient in detail.   All  questions were answered. The patient knows to call the clinic with any problems, questions or concerns.    Earna Coder, MD 11/19/2022 11:01 AM

## 2022-11-19 NOTE — Progress Notes (Signed)
Daughter concerned with pts "gas" smelling like sewage.

## 2022-11-24 ENCOUNTER — Ambulatory Visit (INDEPENDENT_AMBULATORY_CARE_PROVIDER_SITE_OTHER): Payer: PPO

## 2022-11-24 VITALS — Ht 67.0 in | Wt 206.0 lb

## 2022-11-24 DIAGNOSIS — Z0000 Encounter for general adult medical examination without abnormal findings: Secondary | ICD-10-CM | POA: Diagnosis not present

## 2022-11-24 NOTE — Progress Notes (Signed)
I connected with  Doroteo Bradford. on 11/24/22 by a audio enabled telemedicine application and verified that I am speaking with the correct person using two identifiers.  Patient Location: Home  Provider Location: Office/Clinic  I discussed the limitations of evaluation and management by telemedicine. The patient expressed understanding and agreed to proceed.  Subjective:   Ozan Mastro. is a 82 y.o. male who presents for Medicare Annual/Subsequent preventive examination.  Review of Systems      Cardiac Risk Factors include: advanced age (>38men, >32 women);hypertension;male gender;sedentary lifestyle     Objective:    Today's Vitals   11/24/22 1451  Weight: 206 lb (93.4 kg)  Height: 5\' 7"  (1.702 m)   Body mass index is 32.26 kg/m.     11/24/2022    3:03 PM 11/19/2022   10:04 AM 10/31/2022    8:54 AM 07/18/2022   10:26 AM 05/09/2022    9:43 AM 04/18/2022   11:01 AM 02/12/2022   12:00 AM  Advanced Directives  Does Patient Have a Medical Advance Directive? Yes Yes Yes Yes Yes Yes Yes  Type of Estate agent of [place];Living will Healthcare Power of [place];Living will Living will;Healthcare Power of [employer] Power of [place];Living will Healthcare Power of [place];Living will Healthcare Power of [place];Living will Living will;Healthcare Power of Attorney  Does patient want to make changes to medical advance directive?   Yes (ED - Information included in AVS)  No - Patient declined  No - Patient declined  Copy of Healthcare Power of Attorney in Chart? No - copy requested    No - copy requested  No - copy requested  Would patient like information on creating a medical advance directive?   Yes (ED - Information included in AVS)  No - Patient declined      Current Medications (verified) Outpatient Encounter Medications as of 11/24/2022  Medication Sig   acetaminophen (TYLENOL) 325 MG tablet Take 650 mg by mouth as needed for moderate pain  or mild pain.   albuterol (VENTOLIN HFA) 108 (90 Base) MCG/ACT inhaler Inhale 2 puffs into the lungs every 6 (six) hours as needed for wheezing or shortness of breath.   Ascorbic Acid (VITAMIN C) 1000 MG tablet Take 1 tablet (1,000 mg total) by mouth daily.   aspirin 81 MG EC tablet Take 81 mg by mouth daily.     benzonatate (TESSALON) 200 MG capsule Take 1 capsule (200 mg total) by mouth 3 (three) times daily as needed for cough.   Calcium-Magnesium-Vitamin D (CALCIUM 1200+D3 PO) Take 1 tablet by mouth daily.   Carboxymethylcellul-Glycerin (LUBRICATING EYE DROPS OP) Place 1 drop into both eyes daily as needed (irritation).   cetirizine (ZYRTEC) 10 MG chewable tablet Chew 10 mg by mouth daily.   Cholecalciferol (VITAMIN D3) 50 MCG (2000 UT) TABS Take 2,000 Units by mouth daily.   Cyanocobalamin (B-12) 5000 MCG CAPS Take 5,000 mcg by mouth daily.   docusate sodium (COLACE) 100 MG capsule Take 200 mg by mouth at bedtime.   finasteride (PROSCAR) 5 MG tablet TAKE 1 TABLET BY MOUTH DAILY   fluticasone (FLONASE) 50 MCG/ACT nasal spray Place 2 sprays into both nostrils daily.   guaiFENesin (MUCINEX) 600 MG 12 hr tablet Take by mouth 2 (two) times daily.   lidocaine-prilocaine (EMLA) cream Apply 30 -45 mins prior to port access.   Melatonin 10 MG CAPS Take 10 mg by mouth at bedtime as needed (sleep).   nitroGLYCERIN (NITROSTAT) 0.4 MG SL  tablet Place 1 tablet (0.4 mg total) under the tongue every 5 (five) minutes as needed for chest pain.   polyethylene glycol powder (GLYCOLAX/MIRALAX) 17 GM/SCOOP powder Take 8.5-17 g by mouth daily as needed for moderate constipation.   prochlorperazine (COMPAZINE) 10 MG tablet Take 1 tablet (10 mg total) by mouth every 6 (six) hours as needed for nausea or vomiting.   simvastatin (ZOCOR) 40 MG tablet Take 1 tablet (40 mg total) by mouth at bedtime.   valsartan (DIOVAN) 80 MG tablet TAKE 1 TABLET BY MOUTH DAILY   Vitamin E 450 MG (1000 UT) CAPS Take 450 Units by mouth  daily.   Zinc 50 MG TABS Take 50 mg by mouth daily.   pantoprazole (PROTONIX) 40 MG tablet Take 1 tablet (40 mg total) by mouth daily. (Patient not taking: Reported on 11/24/2022)   Facility-Administered Encounter Medications as of 11/24/2022  Medication   heparin lock flush 100 UNIT/ML injection    Allergies (verified) Patient has no known allergies.   History: Past Medical History:  Diagnosis Date   Allergy    seasonal   Arthritis    all over- in general    CAD (coronary artery disease)    a. inferior wall MI 10/01 s/p PCI/DES to RCA; b. Myoview 3/16 neg for ischemia; c. LHC 8/16: ostLAD 80%, OM1 70%, OM2 70% x 2 lesions, mRCA 30%, dRCA 70% s/p 4-V CABG 01/24/15 (LIMA-LAD, VG- OM1, VG-OM2, VG-PDA)    Cancer (HCC)    skin, melanoma   Carotid artery disease (HCC)    a. [place] 8/16: 1-39% bilateral ICA stenosis   Cataract    removed   Diastolic dysfunction    a. 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   stent RCA   Lung cancer (HCC)    Postoperative wound infection 02/02/2015   Reflux esophagitis    Sleep apnea 2017   CPAP at night   Past Surgical History:  Procedure Laterality Date   arm surgery  2010   BROW LIFT Bilateral 11/25/2019   Procedure: BROW PTOSIS REPAIR BILATERAL;  Surgeon: Imagene Riches, MD;  [facility]: [facility];  Service: Ophthalmology;  Laterality: Bilateral;  sleep apnea   CARDIAC CATHETERIZATION  06/24/2011   CARDIAC CATHETERIZATION N/A 01/18/2015   Procedure: Left Heart Cath with coronary angiography;  Surgeon: Antonieta Iba, MD;  Location: [facility];  Service: Cardiovascular;  Laterality: N/A;   CARDIAC CATHETERIZATION N/A 01/18/2015   Procedure: Intravascular Pressure  Wire/FFR Study;  Surgeon: Iran Ouch, MD;  Location: [facility];  Service: Cardiovascular;  Laterality: N/A;   CAROTID STENT  03/10/2011   COLONOSCOPY  2010   COLONOSCOPY  06/14/2014   Dr Rhea Belton   COLONOSCOPY  01/2022   done for colon thickening at splenic flexure - 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[name])   CORONARY ARTERY BYPASS GRAFT N/A 01/24/2015   Procedure: CORONARY ARTERY BYPASS GRAFTING x 4 (LIMA-LAD, SVG-Int 1- Int 2, SVG-PD) ENDOSCOPIC GREATER SAPHENOUS VEIN HARVEST LEFT LEG;  Surgeon: Delight Ovens, MD;  Location: [facility] OR;  Service: Open Heart Surgery;  Laterality: N/A;   EMBOLECTOMY  06/15/2019   Procedure: EMBOLECTOMY;  Surgeon: Renford Dills, MD;  Location: [facility];  Service: Vascular;;  right superficial femoral artery   ENDARTERECTOMY FEMORAL Right 06/15/2019   Procedure: ENDARTERECTOMY FEMORAL;  Surgeon: Renford Dills, MD;  Location: [facility];  Service: Vascular;  Laterality: Right;  common femoral profunda femoris superficial femoral   ESOPHAGOGASTRODUODENOSCOPY (EGD) WITH PROPOFOL N/A 04/24/2016   Procedure: ESOPHAGOGASTRODUODENOSCOPY (EGD) WITH PROPOFOL;  Surgeon: Beverley Fiedler, MD;  Location: [facility];  Service: Gastroenterology;  Laterality: N/A;   EYE SURGERY     lasik 15 yrs. ago, cataracts removed - both eyes    HAMMER TOE SURGERY     right toe   INSERTION OF ILIAC STENT Right 06/15/2019   Procedure: INSERTION OF ILIAC STENT ( STENTING OF SFA/POP ARTERY );  Surgeon: Renford Dills, MD;  Location: [facility];  Service: Vascular;  Laterality: Right;  angioplpasty and stent placement: right superficial femoral right tibiopopliteal trunk bilateral common iliac arteries   IR IMAGING GUIDED PORT INSERTION  11/09/2020   LEFT HEART CATH AND CORONARY ANGIOGRAPHY Left 06/10/2017   Procedure: LEFT HEART CATH AND CORONARY ANGIOGRAPHY;  Surgeon: Antonieta Iba, MD;  Location: [facility];  Service: Cardiovascular;   Laterality: Left;   LOWER EXTREMITY ANGIOGRAPHY Left 01/04/2019   Procedure: LOWER EXTREMITY ANGIOGRAPHY;  Surgeon: Renford Dills, MD;  Location: [facility];  Service: Cardiovascular;  Laterality: Left;   LOWER EXTREMITY ANGIOGRAPHY Right 01/25/2019   Procedure: LOWER EXTREMITY ANGIOGRAPHY;  Surgeon: Renford Dills, MD;  Location: [facility];  Service: Cardiovascular;  Laterality: Right;   LOWER EXTREMITY ANGIOGRAPHY Right 01/15/2021   LOWER EXTREMITY ANGIOGRAPHY and stent placement to R SFA and popliteal artery Gilda Crease, Latina Craver, MD)   NASAL SINUS SURGERY  2008   septpolasty, bilateral turbinate reduction   SHOULDER ARTHROSCOPY  2012   TEE WITHOUT CARDIOVERSION N/A 01/24/2015   Procedure: TRANSESOPHAGEAL ECHOCARDIOGRAM (TEE);  Surgeon: Delight Ovens, MD;  Location: [facility];  Service: Open Heart Surgery;  Laterality: N/A;   TOE SURGERY  1994   UPPER GI ENDOSCOPY  07/2014, 04-24-16   Dr Margretta Sidle   WRIST SURGERY  2011   Family History  Problem Relation Age of Onset   Hypertension Mother    Heart disease Mother    Hypertension Father    Diabetes Father    Lymphoma Sister    Heart disease Brother 95   Cancer Paternal Grandfather    Colon cancer Neg Hx    Prostate cancer Neg Hx    Bladder Cancer Neg Hx    Kidney cancer Neg Hx    Colon polyps Neg Hx    Esophageal cancer Neg Hx    Pancreatic cancer Neg Hx    Stomach cancer Neg Hx    Social History   Socioeconomic History   Marital status: Single    Spouse name: Not on file   Number of children: Not on file   Years of education: 12   Highest education level: 12th grade  Occupational History   Occupation: retired    Comment: Pensions consultant  Tobacco Use   Smoking status: Former    Packs/day: 2.50    Years: 40.00    Additional pack years: 0.00    Total pack years: 100.00    Types: Cigarettes    Quit date: 03/24/2000    Years since quitting: 22.6   Smokeless tobacco: Never  Vaping Use   Vaping  Use: Never used  Substance and Sexual Activity   Alcohol use: Yes  Comment: occassionally   Drug use: No   Sexual activity: Yes  Other Topics Concern   Not on file  Social History Narrative   Singled; lives with son and dog    Occ: retired, back part time at [employer];    Activity: gym 4-5d/wk   Diet: good water, fruits/vegetables daily   Caffeine Use-yes      H/o Financial planner   ------------------------------------       Retail banker- retd; [employer] store- retd; quit smoking 2001. Alcohol couple nights a week. Live in [place]. Daughter lives 10 mins.    Social Determinants of Health   Financial Resource Strain: Low Risk  (11/24/2022)   Overall Financial Resource Strain (CARDIA)    Difficulty of Paying Living Expenses: Not hard at all  Food Insecurity: No Food Insecurity (11/24/2022)   Hunger Vital Sign    Worried About Running Out of Food in the Last Year: Never true    Ran Out of Food in the Last Year: Never true  Transportation Needs: No Transportation Needs (11/24/2022)   PRAPARE - Administrator, Civil Service (Medical): No    Lack of Transportation (Non-Medical): No  Physical Activity: Sufficiently Active (11/24/2022)   Exercise Vital Sign    Days of Exercise per Week: 7 days    Minutes of Exercise per Session: 40 min  Recent Concern: Physical Activity - Insufficiently Active (11/04/2022)   Exercise Vital Sign    Days of Exercise per Week: 5 days    Minutes of Exercise per Session: 10 min  Stress: No Stress Concern Present (11/24/2022)   [employer] of Occupational Health - Occupational Stress Questionnaire    Feeling of Stress : Not at all  Social Connections: Moderately Isolated (11/24/2022)   Social Connection and Isolation Panel [NHANES]    Frequency of Communication with Friends and Family: More than three times a week    Frequency of Social Gatherings with Friends and Family: More than three times a week    Attends Religious Services: More than 4  times per year    Active Member of [employer] or Organizations: No    Attends Engineer, structural: Never    Marital Status: Divorced    Tobacco Counseling Counseling given: Not Answered   Clinical Intake:  Pre-visit preparation completed: Yes  Pain : No/denies pain     Nutritional Risks: None Diabetes: No  How often do you need to have someone help you when you read instructions, pamphlets, or other written materials from your doctor or pharmacy?: 1 - Never  Diabetic? no  Interpreter Needed?: No  Information entered by :: C.Karsen Fellows LPN   Activities of Daily Living    11/24/2022    3:04 PM 11/20/2022    8:08 PM  In your present state of health, do you have any difficulty performing the following activities:  Hearing? 0 0  Vision? 0 0  Difficulty concentrating or making decisions? 0 1  Walking or climbing stairs? 0 0  Dressing or bathing? 0 0  Doing errands, shopping? 1 1  Comment daughter assists   Quarry manager and eating ? N Y  Using the Toilet? N N  In the past six months, have you accidently leaked urine? N N  Do you have problems with loss of bowel control? N N  Managing your Medications? N Y  Managing your Finances? N Y  Housekeeping or managing your Housekeeping? N N    Patient Care Team: Eustaquio Boyden, MD as  PCP - General (Family Medicine) Mariah Milling, Tollie Pizza, MD as PCP - Cardiology (Cardiology) Sherlene Shams, MD (Internal Medicine) Antonieta Iba, MD as Consulting Physician (Cardiology) Drema Dallas, DO as Consulting Physician (Neurology) Earna Coder, MD as Consulting Physician (Hematology and Oncology) Glory Buff, RN as Oncology Nurse Navigator  Indicate any recent Medical Services you may have received from other than [facility] providers in the past year (date may be approximate).     Assessment:   This is a routine wellness examination for [place].  Hearing/Vision screen Hearing Screening - Comments:: aids Vision  Screening - Comments:: No vision issues - unknown provider  Dietary issues and exercise activities discussed: Current Exercise Habits: Home exercise routine, Type of exercise: stretching, Time (Minutes): 45, Frequency (Times/Week): 7, Weekly Exercise (Minutes/Week): 315, Intensity: Mild, Exercise limited by: None identified   Goals Addressed             This Visit's Progress    Patient Stated       No new goals       Depression Screen    11/24/2022    3:03 PM 11/05/2022    9:32 AM 09/26/2021    9:40 AM 03/21/2020    3:38 PM 01/24/2019   12:24 PM 08/24/2017    2:00 PM 07/14/2016   10:14 AM  PHQ 2/9 Scores  PHQ - 2 Score 0 0 0 0 0 0 0  PHQ- 9 Score 0 1  0 0 0     Fall Risk    11/24/2022    2:55 PM 11/20/2022    8:08 PM 11/05/2022    9:32 AM 09/26/2021    9:34 AM 03/21/2020    3:38 PM  Fall Risk   Falls in the past year? 0 0 0 0 0  Number falls in past yr: 0   0 0  Injury with Fall? 0 0  0 0  Risk for fall due to : No Fall Risks    Medication side effect  Follow up Falls prevention discussed;Falls evaluation completed   Falls evaluation completed;Falls prevention discussed;Education provided Falls evaluation completed;Falls prevention discussed    FALL RISK PREVENTION PERTAINING TO THE HOME:  Any stairs in or around the home? No  If so, are there any without handrails? No  Home free of loose throw rugs in walkways, pet beds, electrical cords, etc? Yes  Adequate lighting in your home to reduce risk of falls? Yes   ASSISTIVE DEVICES UTILIZED TO PREVENT FALLS:  Life alert? No  Use of a cane, walker or w/c? No  Grab bars in the bathroom? No  Shower chair or bench in shower? Yes  Elevated toilet seat or a handicapped toilet? No    Cognitive Function:    03/21/2020    3:40 PM 01/24/2019   12:27 PM 08/24/2017    2:13 PM 07/14/2016   10:14 AM  MMSE - Mini Mental State Exam  Orientation to time 5 5 5 5   Orientation to Place 5 5 5 5   Registration 3 3 3 3   Attention/  Calculation 3 2 0 0  Attention/Calculation-comments  did not get r,o,w    Recall 2 3 2 2   Recall-comments   unable to recall 1 of 3 words pt was unable to recall 1 of 3 words  Language- name 2 objects  0 0 0  Language- repeat 1 1 1 1   Language- follow 3 step command  0 3 3  Language- read & follow direction  0 0 0  Write a sentence  0 0 0  Copy design  0 0 0  Total score  19 19 19         11/24/2022    3:05 PM 09/26/2021    9:36 AM  6CIT Screen  What Year? 0 points 0 points  What month? 0 points 0 points  What time? 0 points 0 points  Count back from 20 4 points 0 points  Months in reverse 4 points 4 points  Repeat phrase 10 points 8 points  Total Score 18 points 12 points    Immunizations Immunization History  Administered Date(s) Administered   Fluad Quad(high Dose 65+) 03/17/2019, 03/23/2020, 04/08/2022   Influenza Split 02/27/2012, 04/10/2014   Influenza, High Dose Seasonal PF 04/05/2013, 02/08/2016, 06/06/2021   Influenza,inj,Quad PF,6+ Mos 03/22/2015, 02/25/2017, 03/11/2018   PFIZER(Purple Top)SARS-COV-2 Vaccination 06/30/2019, 07/21/2019   Pneumococcal Conjugate-13 04/13/2015   Pneumococcal Polysaccharide-23 02/26/2005, 02/27/2012   Tdap 04/18/2015   Zoster Recombinat (Shingrix) 09/23/2017, 02/12/2018   Zoster, Live 04/06/2011    TDAP status: Up to date  Flu Vaccine status: Up to date  Pneumococcal vaccine status: Up to date  Covid-19 vaccine status: Information provided on how to obtain vaccines.   Qualifies for Shingles Vaccine? Yes   Zostavax completed Yes   Shingrix Completed?: Yes  Screening Tests Health Maintenance  Topic Date Due   COVID-19 Vaccine (3 - Pfizer risk series) 08/18/2019   INFLUENZA VACCINE  01/08/2023   Medicare Annual Wellness (AWV)  11/24/2023   DTaP/Tdap/Td (2 - Td or Tdap) 04/17/2025   Pneumonia Vaccine 78+ Years old  Completed   Zoster Vaccines- Shingrix  Completed   HPV VACCINES  Aged Out   Hepatitis C Screening   Discontinued    Health Maintenance  Health Maintenance Due  Topic Date Due   COVID-19 Vaccine (3 - Pfizer risk series) 08/18/2019    Colorectal cancer screening: No longer required.   Lung Cancer Screening: (Low Dose CT Chest recommended if Age 64-80 years, 30 pack-year currently smoking OR have quit w/in 15years.) does not qualify.   Lung Cancer Screening Referral: no  Additional Screening:  Hepatitis C Screening: does qualify; Completed 11/02/14  Vision Screening: Recommended annual ophthalmology exams for early detection of glaucoma and other disorders of the eye. Is the patient up to date with their annual eye exam?  Yes  Who is the provider or what is the name of the office in which the patient attends annual eye exams? Unknown provider If pt is not established with a provider, would they like to be referred to a provider to establish care? Yes .   Dental Screening: Recommended annual dental exams for proper oral hygiene  Community Resource Referral / Chronic Care Management: CRR required this visit?  No   CCM required this visit?  No      Plan:     I have personally reviewed and noted the following in the patient's chart:   Medical and social history Use of alcohol, tobacco or illicit drugs  Current medications and supplements including opioid prescriptions. Patient is not currently taking opioid prescriptions. Functional ability and status Nutritional status Physical activity Advanced directives List of other physicians Hospitalizations, surgeries, and ER visits in previous 12 months Vitals Screenings to include cognitive, depression, and falls Referrals and appointments  In addition, I have reviewed and discussed with patient certain preventive protocols, quality metrics, and best practice recommendations. A written personalized care plan for preventive services as well as general  preventive health recommendations were provided to patient.     Maryan Puls, LPN   07/09/8655   Nurse Notes: 6CIT score 18

## 2022-11-24 NOTE — Patient Instructions (Signed)
Randall Ali , Thank you for taking time to come for your Medicare Wellness Visit. I appreciate your ongoing commitment to your health goals. Please review the following plan we discussed and let me know if I can assist you in the future.   These are the goals we discussed:  Goals      Increase physical activity     Starting 07/14/16, I will continue to exercise at least 60 min 4 days per week.      Increase physical activity     Starting 08/24/2017, I will continue to exercise at gym for 60 minutes 3-4 days per week.      Patient Stated     01/24/2019, stay healthy     Patient Stated     03/21/2020, I will continue to go to the gym 6-7 days a week for about 60 minutes.     Patient Stated     No goals     Patient Stated     No new goals        This is a list of the screening recommended for you and due dates:  Health Maintenance  Topic Date Due   COVID-19 Vaccine (3 - Pfizer risk series) 08/18/2019   Flu Shot  01/08/2023   Medicare Annual Wellness Visit  11/24/2023   DTaP/Tdap/Td vaccine (2 - Td or Tdap) 04/17/2025   Pneumonia Vaccine  Completed   Zoster (Shingles) Vaccine  Completed   HPV Vaccine  Aged Out   Hepatitis C Screening  Discontinued    Advanced directives: Please bring a copy of your health care power of attorney and living will to the office to be added to your chart at your convenience.   Conditions/risks identified: Aim for 30 minutes of exercise or brisk walking, 6-8 glasses of water, and 5 servings of fruits and vegetables each day.   Next appointment: Follow up in one year for your annual wellness visit. 11/25/23 @ 3pm telephone  Preventive Care 65 Years and Older, Male  Preventive care refers to lifestyle choices and visits with your health care provider that can promote health and wellness. What does preventive care include? A yearly physical exam. This is also called an annual well check. Dental exams once or twice a year. Routine eye exams. Ask your  health care provider how often you should have your eyes checked. Personal lifestyle choices, including: Daily care of your teeth and gums. Regular physical activity. Eating a healthy diet. Avoiding tobacco and drug use. Limiting alcohol use. Practicing safe sex. Taking low doses of aspirin every day. Taking vitamin and mineral supplements as recommended by your health care provider. What happens during an annual well check? The services and screenings done by your health care provider during your annual well check will depend on your age, overall health, lifestyle risk factors, and family history of disease. Counseling  Your health care provider may ask you questions about your: Alcohol use. Tobacco use. Drug use. Emotional well-being. Home and relationship well-being. Sexual activity. Eating habits. History of falls. Memory and ability to understand (cognition). Work and work Astronomer. Screening  You may have the following tests or measurements: Height, weight, and BMI. Blood pressure. Lipid and cholesterol levels. These may be checked every 5 years, or more frequently if you are over 66 years old. Skin check. Lung cancer screening. You may have this screening every year starting at age 81 if you have a 30-pack-year history of smoking and currently smoke or have quit within  the past 15 years. Fecal occult blood test (FOBT) of the stool. You may have this test every year starting at age 72. Flexible sigmoidoscopy or colonoscopy. You may have a sigmoidoscopy every 5 years or a colonoscopy every 10 years starting at age 60. Prostate cancer screening. Recommendations will vary depending on your family history and other risks. Hepatitis C blood test. Hepatitis B blood test. Sexually transmitted disease (STD) testing. Diabetes screening. This is done by checking your blood sugar (glucose) after you have not eaten for a while (fasting). You may have this done every 1-3  years. Abdominal aortic aneurysm (AAA) screening. You may need this if you are a current or former smoker. Osteoporosis. You may be screened starting at age 9 if you are at high risk. Talk with your health care provider about your test results, treatment options, and if necessary, the need for more tests. Vaccines  Your health care provider may recommend certain vaccines, such as: Influenza vaccine. This is recommended every year. Tetanus, diphtheria, and acellular pertussis (Tdap, Td) vaccine. You may need a Td booster every 10 years. Zoster vaccine. You may need this after age 81. Pneumococcal 13-valent conjugate (PCV13) vaccine. One dose is recommended after age 81. Pneumococcal polysaccharide (PPSV23) vaccine. One dose is recommended after age 36. Talk to your health care provider about which screenings and vaccines you need and how often you need them. This information is not intended to replace advice given to you by your health care provider. Make sure you discuss any questions you have with your health care provider. Document Released: 06/22/2015 Document Revised: 02/13/2016 Document Reviewed: 03/27/2015 Elsevier Interactive Patient Education  2017 [employer].  Fall Prevention in the Home Falls can cause injuries. They can happen to people of all ages. There are many things you can do to make your home safe and to help prevent falls. What can I do on the outside of my home? Regularly fix the edges of walkways and driveways and fix any cracks. Remove anything that might make you trip as you walk through a door, such as a raised step or threshold. Trim any bushes or trees on the path to your home. Use bright outdoor lighting. Clear any walking paths of anything that might make someone trip, such as rocks or tools. Regularly check to see if handrails are loose or broken. Make sure that both sides of any steps have handrails. Any raised decks and porches should have guardrails on the  edges. Have any leaves, snow, or ice cleared regularly. Use sand or salt on walking paths during winter. Clean up any spills in your garage right away. This includes oil or grease spills. What can I do in the bathroom? Use night lights. Install grab bars by the toilet and in the tub and shower. Do not use towel bars as grab bars. Use non-skid mats or decals in the tub or shower. If you need to sit down in the shower, use a plastic, non-slip stool. Keep the floor dry. Clean up any water that spills on the floor as soon as it happens. Remove soap buildup in the tub or shower regularly. Attach bath mats securely with double-sided non-slip rug tape. Do not have throw rugs and other things on the floor that can make you trip. What can I do in the bedroom? Use night lights. Make sure that you have a light by your bed that is easy to reach. Do not use any sheets or blankets that are too big  for your bed. They should not hang down onto the floor. Have a firm chair that has side arms. You can use this for support while you get dressed. Do not have throw rugs and other things on the floor that can make you trip. What can I do in the kitchen? Clean up any spills right away. Avoid walking on wet floors. Keep items that you use a lot in easy-to-reach places. If you need to reach something above you, use a strong step stool that has a grab bar. Keep electrical cords out of the way. Do not use floor polish or wax that makes floors slippery. If you must use wax, use non-skid floor wax. Do not have throw rugs and other things on the floor that can make you trip. What can I do with my stairs? Do not leave any items on the stairs. Make sure that there are handrails on both sides of the stairs and use them. Fix handrails that are broken or loose. Make sure that handrails are as long as the stairways. Check any carpeting to make sure that it is firmly attached to the stairs. Fix any carpet that is loose or  worn. Avoid having throw rugs at the top or bottom of the stairs. If you do have throw rugs, attach them to the floor with carpet tape. Make sure that you have a light switch at the top of the stairs and the bottom of the stairs. If you do not have them, ask someone to add them for you. What else can I do to help prevent falls? Wear shoes that: Do not have high heels. Have rubber bottoms. Are comfortable and fit you well. Are closed at the toe. Do not wear sandals. If you use a stepladder: Make sure that it is fully opened. Do not climb a closed stepladder. Make sure that both sides of the stepladder are locked into place. Ask someone to hold it for you, if possible. Clearly mark and make sure that you can see: Any grab bars or handrails. First and last steps. Where the edge of each step is. Use tools that help you move around (mobility aids) if they are needed. These include: Canes. Walkers. Scooters. Crutches. Turn on the lights when you go into a dark area. Replace any light bulbs as soon as they burn out. Set up your furniture so you have a clear path. Avoid moving your furniture around. If any of your floors are uneven, fix them. If there are any pets around you, be aware of where they are. Review your medicines with your doctor. Some medicines can make you feel dizzy. This can increase your chance of falling. Ask your doctor what other things that you can do to help prevent falls. This information is not intended to replace advice given to you by your health care provider. Make sure you discuss any questions you have with your health care provider. Document Released: 03/22/2009 Document Revised: 11/01/2015 Document Reviewed: 06/30/2014 Elsevier Interactive Patient Education  2017 [employer].

## 2023-01-06 ENCOUNTER — Other Ambulatory Visit: Payer: Self-pay | Admitting: Internal Medicine

## 2023-01-06 ENCOUNTER — Other Ambulatory Visit: Payer: Self-pay | Admitting: Cardiovascular Disease

## 2023-01-06 NOTE — Telephone Encounter (Signed)
Pt scheduled on 8/12

## 2023-01-06 NOTE — Telephone Encounter (Signed)
Hi,   Could you schedule this patient a 12 month follow up? The patient was last seen by Dr. Mariah Milling on 10-21-21. Thank you so much.

## 2023-01-18 NOTE — Progress Notes (Unsigned)
Cardiology Office Note  Date:  01/19/2023   ID:  Randall Ali., DOB Aug 17, 1940, MRN [id number]  PCP:  Eustaquio Boyden, MD   Chief Complaint  Patient presents with   Follow-up    12 month follow up. Patient reports neck pain. Meds reviewed verbally with patient.     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  Previous admit in 2011 for mild chest pain and sweating.  Stress test showed no ischemia with inferior scar.  Previous cardiac catheterization showing moderate OM disease.  Repeat catheterization  in August 2016 showing severe three-vessel disease,  sent for CABG on 01/24/2015.  Non-small cell lung cancer stage IV May 2022 He presents today for follow-up of his coronary artery disease  Last seen by myself in clinic May 2023 Presents today with family Sedentary at home Used to go to the gym on a regular basis, Reports he no longer drives, does not go to the gym Does not have a workout regimen at home, no regular walking Has chest discomfort mediastinal area both sides, tender on palpation In his weights at home has not been using them  No exertional shortness of breath, No chest pain linked with exertion  Work reviewed, LDL 80  EKG personally reviewed by myself on todays visit EKG Interpretation Date/Time:  Monday January 19 2023 08:51:45 EDT Ventricular Rate:  73 PR Interval:  158 QRS Duration:  92 QT Interval:  380 QTC Calculation: 418 R Axis:   -23  Text Interpretation: Sinus rhythm with occasional Premature ventricular complexes Inferior infarct (cited on or before 25-Oct-2018) When compared with ECG of 25-Oct-2018 08:12, No significant change was found Confirmed by Randall Ali [phone]) on 01/19/2023 8:54:21 AM   Other past medical history reviewed diagnosed with non-small cell lung cancer, stage IV in 10/2020.    treated with CarboTaxol plus Keytruda , prednisone and chemo  Echo 05/2021: EF 50 to 55%  LE PCI followed by  common femoral endarterectomy extending into the profunda femoris and the SFA. -- Angioplasty to the right tibioperoneal trunk with 5 mm diameter Lutonix drug-coated angioplasty balloon 4.  Stent placement to the right tibioperoneal artery with 6 mm diameter by 4 cm length life stent 5.  Stent placement to the right SFA and popliteal artery x2 with 7 mm diameter by 20 cm length and 7 mm diameter by 15 cm length life stent postdilated with 7 mm diameter with Lutonix drug-coated angioplasty balloons 6.  Fogarty embolectomy balloon to the right SFA and popliteal arteries for thrombus after above procedures  Cardiac catheterization 06/10/2017 Severe three vessel disease, occluded SVG to the RCA, Patent SVG to OM 1 and OM2, patent LIMA to the LAD Will try medical management first Add plavix, nitrates  intervention would require atherectomy in [facility]  GERD sx improved on PPI EGD  07/2014 showing moderate to severe esophagitis  postoperative atrial fibrillation,  postoperative delirium and began having fevers on 01/29/2015.  some drainage from the inferior portion of his sternal wound. Blood cultures and wound cultures grew Enterobacter aerogenes. He was discharged on IV ceftriaxone  and completed 6 weeks of therapy . He took probiotics during this time   PMH:   has a past medical history of Allergy, Arthritis, CAD (coronary artery disease), Cancer (HCC), Carotid artery disease (HCC), Cataract, Diastolic dysfunction, Erectile dysfunction, GERD (gastroesophageal reflux  disease), History of elbow surgery, History of hiatal hernia, HLD (hyperlipidemia), HTN (hypertension), Inferior myocardial infarction (HCC) (03/2000), Lung cancer (HCC), Postoperative wound infection (02/02/2015), Reflux esophagitis, and Sleep apnea (2017).  PSH:    Past Surgical History:  Procedure Laterality Date    arm surgery  2010   BROW LIFT Bilateral 11/25/2019   Procedure: BROW PTOSIS REPAIR BILATERAL;  Surgeon: Imagene Riches, MD;  Location: [facility];  Service: Ophthalmology;  Laterality: Bilateral;  sleep apnea   CARDIAC CATHETERIZATION  06/24/2011   CARDIAC CATHETERIZATION N/A 01/18/2015   Procedure: Left Heart Cath with coronary angiography;  Surgeon: Antonieta Iba, MD;  Location: [facility];  Service: Cardiovascular;  Laterality: N/A;   CARDIAC CATHETERIZATION N/A 01/18/2015   Procedure: Intravascular Pressure Wire/FFR Study;  Surgeon: Iran Ouch, MD;  Location: [facility];  Service: Cardiovascular;  Laterality: N/A;   CAROTID STENT  03/10/2011   COLONOSCOPY  2010   COLONOSCOPY  06/14/2014   Dr Randall Ali   COLONOSCOPY  01/2022   done for colon thickening at splenic flexure - 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[name])   CORONARY ARTERY BYPASS GRAFT N/A 01/24/2015   Procedure: CORONARY ARTERY BYPASS GRAFTING x 4 (LIMA-LAD, SVG-Int 1- Int 2, SVG-PD) ENDOSCOPIC GREATER SAPHENOUS VEIN HARVEST LEFT LEG;  Surgeon: Delight Ovens, MD;  Location: [facility];  Service: Open Heart Surgery;  Laterality: N/A;   EMBOLECTOMY  06/15/2019   Procedure: EMBOLECTOMY;  Surgeon: Renford Dills, MD;  Location: [facility];  Service: Vascular;;  right superficial femoral artery   ENDARTERECTOMY FEMORAL Right 06/15/2019   Procedure: ENDARTERECTOMY FEMORAL;  Surgeon: Renford Dills, MD;  Location: [facility];  Service: Vascular;  Laterality: Right;  common femoral profunda femoris superficial femoral   ESOPHAGOGASTRODUODENOSCOPY (EGD) WITH PROPOFOL N/A 04/24/2016   Procedure: ESOPHAGOGASTRODUODENOSCOPY (EGD) WITH PROPOFOL;  Surgeon: Beverley Fiedler, MD;  Location: [facility];  Service: Gastroenterology;  Laterality: N/A;   EYE SURGERY     lasik 15 yrs. ago, cataracts removed - both eyes    HAMMER TOE SURGERY     right toe   INSERTION OF ILIAC STENT Right 06/15/2019    Procedure: INSERTION OF ILIAC STENT ( STENTING OF SFA/POP ARTERY );  Surgeon: Renford Dills, MD;  Location: [facility];  Service: Vascular;  Laterality: Right;  angioplpasty and stent placement: right superficial femoral right tibiopopliteal trunk bilateral common iliac arteries   IR IMAGING GUIDED PORT INSERTION  11/09/2020   LEFT HEART CATH AND CORONARY ANGIOGRAPHY Left 06/10/2017   Procedure: LEFT HEART CATH AND CORONARY ANGIOGRAPHY;  Surgeon: Antonieta Iba, MD;  Location: [facility];  Service: Cardiovascular;  Laterality: Left;   LOWER EXTREMITY ANGIOGRAPHY Left 01/04/2019   Procedure: LOWER EXTREMITY ANGIOGRAPHY;  Surgeon: Renford Dills, MD;  Location: [facility];  Service: Cardiovascular;  Laterality: Left;   LOWER EXTREMITY ANGIOGRAPHY Right 01/25/2019   Procedure: LOWER EXTREMITY ANGIOGRAPHY;  Surgeon: Renford Dills, MD;  Location: [facility];  Service: Cardiovascular;  Laterality: Right;   LOWER EXTREMITY ANGIOGRAPHY Right 01/15/2021   LOWER EXTREMITY ANGIOGRAPHY and stent placement to R SFA and popliteal artery Gilda Crease, Latina Craver, MD)   NASAL SINUS SURGERY  2008   septpolasty, bilateral turbinate reduction   SHOULDER ARTHROSCOPY  2012   TEE WITHOUT CARDIOVERSION N/A 01/24/2015   Procedure: TRANSESOPHAGEAL ECHOCARDIOGRAM (TEE);  Surgeon: Delight Ovens, MD;  Location: [facility];  Service: Open Heart Surgery;  Laterality: N/A;  TOE SURGERY  1994   UPPER GI ENDOSCOPY  07/2014, 04-24-16   Dr Margretta Sidle   WRIST SURGERY  2011    Current Outpatient Medications  Medication Sig Dispense Refill   acetaminophen (TYLENOL) 325 MG tablet Take 650 mg by mouth as needed for moderate pain or mild pain.     albuterol (VENTOLIN HFA) 108 (90 Base) MCG/ACT inhaler Inhale 2 puffs into the lungs every 6 (six) hours as needed for wheezing or shortness of breath. 18 g 2   Ascorbic Acid (VITAMIN C) 1000 MG tablet Take 1 tablet (1,000 mg total) by mouth daily.      aspirin 81 MG EC tablet Take 81 mg by mouth daily.       benzonatate (TESSALON) 200 MG capsule Take 1 capsule (200 mg total) by mouth 3 (three) times daily as needed for cough. 60 capsule 0   Calcium-Magnesium-Vitamin D (CALCIUM 1200+D3 PO) Take 1 tablet by mouth daily.     Carboxymethylcellul-Glycerin (LUBRICATING EYE DROPS OP) Place 1 drop into both eyes daily as needed (irritation).     cetirizine (ZYRTEC) 10 MG chewable tablet Chew 10 mg by mouth daily.     Cholecalciferol (VITAMIN D3) 50 MCG (2000 UT) TABS Take 2,000 Units by mouth daily. 30 tablet    Cyanocobalamin (B-12) 5000 MCG CAPS Take 5,000 mcg by mouth daily.     docusate sodium (COLACE) 100 MG capsule Take 200 mg by mouth at bedtime.     finasteride (PROSCAR) 5 MG tablet TAKE 1 TABLET BY MOUTH DAILY 90 tablet 2   fluticasone (FLONASE) 50 MCG/ACT nasal spray Place 2 sprays into both nostrils daily.     guaiFENesin (MUCINEX) 600 MG 12 hr tablet Take by mouth 2 (two) times daily.     lidocaine-prilocaine (EMLA) cream Apply 30 -45 mins prior to port access. 30 g 3   Melatonin 10 MG CAPS Take 10 mg by mouth at bedtime as needed (sleep).     nitroGLYCERIN (NITROSTAT) 0.4 MG SL tablet Place 1 tablet (0.4 mg total) under the tongue every 5 (five) minutes as needed for chest pain. 25 tablet 3   pantoprazole (PROTONIX) 40 MG tablet Take 1 tablet (40 mg total) by mouth daily. 90 tablet 1   polyethylene glycol powder (GLYCOLAX/MIRALAX) 17 GM/SCOOP powder Take 8.5-17 g by mouth daily as needed for moderate constipation. 510 g 1   prochlorperazine (COMPAZINE) 10 MG tablet Take 1 tablet (10 mg total) by mouth every 6 (six) hours as needed for nausea or vomiting. 40 tablet 1   simvastatin (ZOCOR) 40 MG tablet TAKE 1 TABLET BY MOUTH AT BEDTIME 30 tablet 0   valsartan (DIOVAN) 80 MG tablet Take 1 tablet (80 mg total) by mouth daily. **FUTURE REFILLS TO PCP** 30 tablet 1   Vitamin E 450 MG (1000 UT) CAPS Take 450 Units by mouth daily.     Zinc 50  MG TABS Take 50 mg by mouth daily.     No current facility-administered medications for this visit.   Facility-Administered Medications Ordered in Other Visits  Medication Dose Route Frequency Provider Last Rate Last Admin   heparin lock flush 100 UNIT/ML injection             Allergies:   Patient has no known allergies.   Social History:  The patient  reports that he quit smoking about 22 years ago. His smoking use included cigarettes. He started smoking about 62 years ago. He has a 100 pack-year smoking history. He has  never used smokeless tobacco. He reports current alcohol use. He reports that he does not use drugs.   Family History:   family history includes Cancer in his paternal grandfather; Diabetes in his father; Heart disease in his mother; Heart disease (age of onset: 64) in his brother; Hypertension in his father and mother; Lymphoma in his sister.   Review of Systems: Review of Systems  Constitutional: Negative.   Respiratory: Negative.    Cardiovascular: Negative.   Gastrointestinal: Negative.   Musculoskeletal: Negative.        Leg weakness  Neurological: Negative.   Psychiatric/Behavioral: Negative.    All other systems reviewed and are negative.   PHYSICAL EXAM: VS:  BP 126/62 (BP Location: Left Arm, Patient Position: Sitting, Cuff Size: Normal)   Pulse 73   Ht 5\' 8"  (1.727 m)   Wt 211 lb (95.7 kg)   BMI 32.08 kg/m  , BMI Body mass index is 32.08 kg/m. Constitutional:  oriented to person, place, and time. No distress.  HENT:  Head: Grossly normal Eyes:  no discharge. No scleral icterus.  Neck: No JVD, no carotid bruits  Cardiovascular: Regular rate and rhythm, no murmurs appreciated Pulmonary/Chest: Clear to auscultation bilaterally, no wheezes or rails Abdominal: Soft.  no distension.  no tenderness.  Musculoskeletal: Normal range of motion Neurological:  normal muscle tone. Coordination normal. No atrophy Skin: Skin warm and dry Psychiatric: normal  affect, pleasant  Recent Labs: 02/11/2022: B Natriuretic Peptide 468.4 11/19/2022: ALT 15; BUN 13; Creatinine, Ser 0.97; Hemoglobin 15.0; Platelets 196; Potassium 4.0; Sodium 140; TSH 1.897    Lipid Panel Lab Results  Component Value Date   CHOL 151 04/08/2022   HDL 42.90 04/08/2022   LDLCALC 80 04/08/2022   TRIG 138.0 04/08/2022    Wt Readings from Last 3 Encounters:  01/19/23 211 lb (95.7 kg)  11/24/22 206 lb (93.4 kg)  11/19/22 208 lb 12.8 oz (94.7 kg)     ASSESSMENT AND PLAN:  Atherosclerosis of native coronary artery of native heart without angina pectoris -  Atypical chest pain, likely musculoskeletal, tender on palpation Recommended light stretching pack as needed No further ischemic workup needed at this time  Paroxysmal atrial fibrillation (HCC) - Plan: EKG 12-Lead Prior history of postop A. fib after bypass surgery Previously reported that he did not want anticoagulation,  Maintaining normal sinus rhythm  PAD (peripheral artery disease) (HCC) Major procedure by vascular 06/2019 On plavix, asa 81 LDL <70 ABIs normal, followed by Dr. Lorretta Harp  Pure hypercholesterolemia LDL above goal, recommend he add Zetia to his simvastatin  Essential hypertension Blood pressure is well controlled on today's visit. No changes made to the medications.  On valsartan  S/P CABG x 4 Having atypical chest pain, musculoskeletal No angina, no further testing  GERD moderate to severe esophagitis on EGD  On ppi, stable sx  erectile dysfunction Followed by PMD   Total encounter time more than 30 minutes  Greater than 50% was spent in counseling and coordination of care with the patient    Orders Placed This Encounter  Procedures   EKG 12-Lead     Signed, Dossie Arbour, M.D., Ph.D. 01/19/2023  [facility] [place], [place] [phone]

## 2023-01-19 ENCOUNTER — Encounter: Payer: Self-pay | Admitting: Cardiovascular Disease

## 2023-01-19 ENCOUNTER — Ambulatory Visit: Payer: PPO | Attending: Cardiovascular Disease | Admitting: Cardiovascular Disease

## 2023-01-19 VITALS — BP 126/62 | HR 73 | Ht 68.0 in | Wt 211.0 lb

## 2023-01-19 DIAGNOSIS — I10 Essential (primary) hypertension: Secondary | ICD-10-CM | POA: Diagnosis not present

## 2023-01-19 DIAGNOSIS — I25118 Atherosclerotic heart disease of native coronary artery with other forms of angina pectoris: Secondary | ICD-10-CM

## 2023-01-19 DIAGNOSIS — I480 Paroxysmal atrial fibrillation: Secondary | ICD-10-CM

## 2023-01-19 DIAGNOSIS — I739 Peripheral vascular disease, unspecified: Secondary | ICD-10-CM

## 2023-01-19 DIAGNOSIS — Z951 Presence of aortocoronary bypass graft: Secondary | ICD-10-CM

## 2023-01-19 DIAGNOSIS — C3492 Malignant neoplasm of unspecified part of left bronchus or lung: Secondary | ICD-10-CM

## 2023-01-19 DIAGNOSIS — E785 Hyperlipidemia, unspecified: Secondary | ICD-10-CM | POA: Diagnosis not present

## 2023-01-19 DIAGNOSIS — I2510 Atherosclerotic heart disease of native coronary artery without angina pectoris: Secondary | ICD-10-CM

## 2023-01-19 MED ORDER — VALSARTAN 80 MG PO TABS: 80.0000 mg | ORAL_TABLET | Freq: Every day | ORAL | 3 refills | Status: DC

## 2023-01-19 MED ORDER — EZETIMIBE 10 MG PO TABS: 10.0000 mg | ORAL_TABLET | Freq: Every day | ORAL | 3 refills | Status: DC

## 2023-01-19 MED ORDER — SIMVASTATIN 40 MG PO TABS: 40.0000 mg | ORAL_TABLET | Freq: Every day | ORAL | 3 refills | Status: DC

## 2023-01-19 NOTE — Patient Instructions (Addendum)
Walking program around the block Stretching for chest  Medication Instructions:  Please start zetia 10 mg daily   If you need a refill on your cardiac medications before your next appointment, please call your pharmacy.   Lab work: No new labs needed  Testing/Procedures: No new testing needed  Follow-Up: At [facility], you and your health needs are our priority.  As part of our continuing mission to provide you with exceptional heart care, we have created designated Provider Care Teams.  These Care Teams include your primary Cardiologist (physician) and Advanced Practice Providers (APPs -  Physician Assistants and Nurse Practitioners) who all work together to provide you with the care you need, when you need it.  You will need a follow up appointment in 12 months  Providers on your designated Care Team:   Nicolasa Ducking, NP Eula Listen, PA-C Cadence Fransico Michael, [place]  COVID-19 Vaccine Information can be found at: [email or web address] For questions related to vaccine distribution or appointments, please email [email or web address] or call [phone].

## 2023-02-19 ENCOUNTER — Inpatient Hospital Stay: Payer: PPO | Attending: Internal Medicine

## 2023-02-19 DIAGNOSIS — Z452 Encounter for adjustment and management of vascular access device: Secondary | ICD-10-CM | POA: Insufficient documentation

## 2023-02-19 DIAGNOSIS — Z95828 Presence of other vascular implants and grafts: Secondary | ICD-10-CM

## 2023-02-19 DIAGNOSIS — C3432 Malignant neoplasm of lower lobe, left bronchus or lung: Secondary | ICD-10-CM | POA: Diagnosis not present

## 2023-02-19 MED ORDER — HEPARIN SOD (PORK) LOCK FLUSH 100 UNIT/ML IV SOLN
500.0000 [IU] | Freq: Once | INTRAVENOUS | Status: AC
  Administered 2023-02-19: 500 [IU] via INTRAVENOUS
  Filled 2023-02-19: qty 5

## 2023-02-19 MED ORDER — SODIUM CHLORIDE 0.9% FLUSH
10.0000 mL | Freq: Once | INTRAVENOUS | Status: AC
  Administered 2023-02-19: 10 mL via INTRAVENOUS
  Filled 2023-02-19: qty 10

## 2023-04-10 ENCOUNTER — Ambulatory Visit: Payer: PPO | Admitting: Primary Care

## 2023-04-10 ENCOUNTER — Encounter: Payer: Self-pay | Admitting: Primary Care

## 2023-04-10 VITALS — BP 120/72 | HR 74 | Temp 97.5°F | Ht 68.0 in | Wt 213.4 lb

## 2023-04-10 DIAGNOSIS — Z23 Encounter for immunization: Secondary | ICD-10-CM | POA: Diagnosis not present

## 2023-04-10 DIAGNOSIS — J849 Interstitial pulmonary disease, unspecified: Secondary | ICD-10-CM | POA: Diagnosis not present

## 2023-04-10 DIAGNOSIS — G4733 Obstructive sleep apnea (adult) (pediatric): Secondary | ICD-10-CM

## 2023-04-10 NOTE — Assessment & Plan Note (Deleted)
Well controlled 

## 2023-04-10 NOTE — Progress Notes (Signed)
@Patient  ID: Randall Ali., male    DOB: 03-27-1941, 82 y.o.   MRN: [id number]  No chief complaint on file.   Referring provider: Eustaquio Boyden, MD  Discussed the use of AI scribe software for clinical note transcription with the patient, who gave verbal consent to proceed.  HPI: 82 year old male, former smoker quit in 2001.  Past medical history significant for hypertension, A-fib, coronary artery disease s/p CABG, OSA, lung cancer, obesity. Previously followed by Dr. Sherene Sires, advised to return as needed.   04/10/2023 Patient presents today for OSA follow-up. Patient has been on BiPAP for several years. [name] compliance report indicates 100% compliance with an average usage of approximately eight hours per night. The pressure settings have remained consistent over the past few years, and the residual apnea-hypopnea index score is 0.3, indicating well-controlled sleep apnea. However, the patient reports experiencing air leaks, which could be due to the mask or tube. The mask was last replaced six to eight months ago. Despite the inconvenience of the BiPAP machine, the patient reports no difficulty falling asleep and typically wakes up only once per night.  The patient also has a history of non-small cell lung cancer, with the most recent CT scan in June 2024 showing no evidence of recurrent or metastatic disease in the chest. A lesion was noted on the kidney, and a follow-up CT scan of the abdomen is scheduled for December.   The patient denies any current respiratory symptoms such as productive cough, wheezing, or chest tightness.      Airview download 03/09/2023 - 04/07/2023 Usage days 30/30 days (100%); 27 days (90%) greater than 4 hours Average usage 7 hours 50 minutes Max IPAP 20 cm H2O-min EPAP 10 cm H2O-pressure support 4 cm H2O Air leaks 35.8 L/min (95%) AHI 0.3  No Known Allergies  Immunization History  Administered Date(s) Administered   Fluad Quad(high Dose  65+) 03/17/2019, 03/23/2020, 04/08/2022   Influenza Split 02/27/2012, 04/10/2014   Influenza, High Dose Seasonal PF 04/05/2013, 02/08/2016, 06/06/2021   Influenza,inj,Quad PF,6+ Mos 03/22/2015, 02/25/2017, 03/11/2018   PFIZER(Purple Top)SARS-COV-2 Vaccination 06/30/2019, 07/21/2019   Pneumococcal Conjugate-13 04/13/2015   Pneumococcal Polysaccharide-23 02/26/2005, 02/27/2012   Tdap 04/18/2015   Zoster Recombinant(Shingrix) 09/23/2017, 02/12/2018   Zoster, Live 04/06/2011    Past Medical History:  Diagnosis Date   Allergy    seasonal   Arthritis    all over- in general    CAD (coronary artery disease)    a. inferior wall MI 10/01 s/p PCI/DES to RCA; b. Myoview 3/16 neg for ischemia; c. LHC 8/16: ostLAD 80%, OM1 70%, OM2 70% x 2 lesions, mRCA 30%, dRCA 70% s/p 4-V CABG 01/24/15 (LIMA-LAD, VG- OM1, VG-OM2, VG-PDA)    Cancer (HCC)    skin, melanoma   Carotid artery disease (HCC)    a. [place] 8/16: 1-39% bilateral ICA stenosis   Cataract    removed   Diastolic dysfunction    a. 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   stent RCA   Lung cancer (HCC)    Postoperative wound infection 02/02/2015   Reflux esophagitis    Sleep apnea 2017   CPAP at night    Tobacco History: Social History  Tobacco Use  Smoking Status Former   Current packs/day: 0.00   Average packs/day: 2.5 packs/day for 40.0 years (100.0 ttl pk-yrs)   Types: Cigarettes   Start date: 03/24/1960   Quit date: 03/24/2000   Years since quitting: 23.0  Smokeless Tobacco Never   Counseling given: Not Answered   Outpatient Medications Prior to Visit  Medication Sig Dispense Refill   acetaminophen (TYLENOL) 325 MG tablet Take 650 mg by mouth as needed  for moderate pain or mild pain.     albuterol (VENTOLIN HFA) 108 (90 Base) MCG/ACT inhaler Inhale 2 puffs into the lungs every 6 (six) hours as needed for wheezing or shortness of breath. 18 g 2   Ascorbic Acid (VITAMIN C) 1000 MG tablet Take 1 tablet (1,000 mg total) by mouth daily.     aspirin 81 MG EC tablet Take 81 mg by mouth daily.       benzonatate (TESSALON) 200 MG capsule Take 1 capsule (200 mg total) by mouth 3 (three) times daily as needed for cough. 60 capsule 0   Calcium-Magnesium-Vitamin D (CALCIUM 1200+D3 PO) Take 1 tablet by mouth daily.     Carboxymethylcellul-Glycerin (LUBRICATING EYE DROPS OP) Place 1 drop into both eyes daily as needed (irritation).     cetirizine (ZYRTEC) 10 MG chewable tablet Chew 10 mg by mouth daily.     Cholecalciferol (VITAMIN D3) 50 MCG (2000 UT) TABS Take 2,000 Units by mouth daily. 30 tablet    Cyanocobalamin (B-12) 5000 MCG CAPS Take 5,000 mcg by mouth daily.     docusate sodium (COLACE) 100 MG capsule Take 200 mg by mouth at bedtime.     ezetimibe (ZETIA) 10 MG tablet Take 1 tablet (10 mg total) by mouth daily. 90 tablet 3   finasteride (PROSCAR) 5 MG tablet TAKE 1 TABLET BY MOUTH DAILY 90 tablet 2   fluticasone (FLONASE) 50 MCG/ACT nasal spray Place 2 sprays into both nostrils daily.     guaiFENesin (MUCINEX) 600 MG 12 hr tablet Take by mouth 2 (two) times daily.     lidocaine-prilocaine (EMLA) cream Apply 30 -45 mins prior to port access. 30 g 3   Melatonin 10 MG CAPS Take 10 mg by mouth at bedtime as needed (sleep).     nitroGLYCERIN (NITROSTAT) 0.4 MG SL tablet Place 1 tablet (0.4 mg total) under the tongue every 5 (five) minutes as needed for chest pain. 25 tablet 3   pantoprazole (PROTONIX) 40 MG tablet Take 1 tablet (40 mg total) by mouth daily. 90 tablet 1   polyethylene glycol powder (GLYCOLAX/MIRALAX) 17 GM/SCOOP powder Take 8.5-17 g by mouth daily as needed for moderate constipation. 510 g 1   prochlorperazine (COMPAZINE) 10 MG tablet  Take 1 tablet (10 mg total) by mouth every 6 (six) hours as needed for nausea or vomiting. 40 tablet 1   simvastatin (ZOCOR) 40 MG tablet Take 1 tablet (40 mg total) by mouth at bedtime. 90 tablet 3   valsartan (DIOVAN) 80 MG tablet Take 1 tablet (80 mg total) by mouth daily. 90 tablet 3   Vitamin E 450 MG (1000 UT) CAPS Take 450 Units by mouth daily.     Zinc 50 MG TABS Take 50 mg by mouth daily.     Facility-Administered Medications Prior to Visit  Medication Dose Route Frequency Provider Last Rate Last Admin   heparin lock flush 100 UNIT/ML injection            Review of Systems  Review of Systems  Constitutional: Negative.  Negative for fatigue.  HENT: Negative.    Respiratory: Negative.    Psychiatric/Behavioral:  Negative for sleep disturbance.    Physical Exam  BP 120/72 (BP Location: Right Arm, Cuff Size: Large)   Pulse 74   Temp (!) 97.5 F (36.4 C)   Ht 5\' 8"  (1.727 m)   Wt 213 lb 6.4 oz (96.8 kg)   SpO2 92%   BMI 32.45 kg/m  Physical Exam Constitutional:      Appearance: Normal appearance.  HENT:     Head: Normocephalic and atraumatic.     Mouth/Throat:     Mouth: Mucous membranes are moist.     Pharynx: Oropharynx is clear.  Cardiovascular:     Rate and Rhythm: Normal rate and regular rhythm.  Pulmonary:     Effort: Pulmonary effort is normal.     Breath sounds: Normal breath sounds.  Skin:    General: Skin is warm and dry.  Neurological:     General: No focal deficit present.     Mental Status: He is alert and oriented to person, place, and time. Mental status is at baseline.  Psychiatric:        Mood and Affect: Mood normal.        Behavior: Behavior normal.        Thought Content: Thought content normal.        Judgment: Judgment normal.      Lab Results:  CBC    Component Value Date/Time   WBC 5.7 11/19/2022 0948   RBC 4.89 11/19/2022 0948   HGB 15.0 11/19/2022 0948   HGB 14.8 01/15/2015 0956   HCT 44.1 11/19/2022 0948   HCT 44.2  01/15/2015 0956   PLT 196 11/19/2022 0948   PLT 207 01/15/2015 0956   MCV 90.2 11/19/2022 0948   MCV 91 01/15/2015 0956   MCH 30.7 11/19/2022 0948   MCHC 34.0 11/19/2022 0948   RDW 13.2 11/19/2022 0948   RDW 12.9 01/15/2015 0956   LYMPHSABS 1.4 11/19/2022 0948   LYMPHSABS 1.9 01/15/2015 0956   MONOABS 0.5 11/19/2022 0948   EOSABS 0.1 11/19/2022 0948   EOSABS 0.2 01/15/2015 0956   BASOSABS 0.0 11/19/2022 0948   BASOSABS 0.0 01/15/2015 0956    BMET    Component Value Date/Time   NA 140 11/19/2022 0948   NA 138 01/15/2015 0956   K 4.0 11/19/2022 0948   CL 108 11/19/2022 0948   CO2 25 11/19/2022 0948   GLUCOSE 115 (H) 11/19/2022 0948   BUN 13 11/19/2022 0948   BUN 16 01/15/2015 0956   CREATININE 0.97 11/19/2022 0948   CALCIUM 8.3 (L) 11/19/2022 0948   GFRNONAA >60 11/19/2022 0948   GFRAA >60 06/16/2019 1126    BNP    Component Value Date/Time   BNP 468.4 (H) 02/11/2022 1845    ProBNP No results found for: "PROBNP"  Imaging: No results found.   Assessment & Plan:      ICD-10-CM   1. Need for influenza vaccination  Z23 Flu Vaccine Trivalent High Dose (Fluad)    2. ILD (interstitial lung disease) (HCC)  J84.9       Obstructive Sleep Apnea Well controlled with BiPAP 20/10 with PS 4. Compliance report shows 100% usage with average usage of 7 hours, 50 minutes. Residual apnea-hypopnea index score of 0.3, indicating good control. Patient reports some air leaks, possibly due to mask wear and tear. -Replace mask components as per schedule (cushions monthly, frame every 3 months, headgear  every 6 months).  -Renew supplies with [facility]. - Advised patient continue to wear BIPAP nightly   Lung Cancer No evidence of recurrent or metastatic disease on last CT scan (June 2024). No current respiratory symptoms reported.  Renal Lesion Identified on CT scan in June 2024. Plan for follow-up CT scan of abdomen in December 2024.  Follow-up: 1 year with [facility] NP or  Dr. Belia Heman re: OSA      Glenford Bayley, NP 04/10/2023

## 2023-04-10 NOTE — Patient Instructions (Addendum)
Sleep apnea is well controlled on currently pressure settings No changes today Recommend replacing your mask, having some airleaks on download  Due for high dose flu shot   Recommendations: Continue to wear BIPAP nightly for 4-6   Follow-up: 1 year with [facility] NP or Dr. Belia Heman re: OSA   Sleep Apnea Sleep apnea affects breathing during sleep. It causes breathing to stop for 10 seconds or more, or to become shallow. People with sleep apnea usually snore loudly. It can also increase the risk of: Heart attack. Stroke. Being very overweight (obese). Diabetes. Heart failure. Irregular heartbeat. High blood pressure. The goal of treatment is to help you breathe normally again. What are the causes?  The most common cause of this condition is a collapsed or blocked airway. There are three kinds of sleep apnea: Obstructive sleep apnea. This is caused by a blocked or collapsed airway. Central sleep apnea. This happens when the brain does not send the right signals to the muscles that control breathing. Mixed sleep apnea. This is a combination of obstructive and central sleep apnea. What increases the risk? Being overweight. Smoking. Having a small airway. Being older. Being male. Drinking alcohol. Taking medicines to calm yourself (sedatives or tranquilizers). Having family members with the condition. Having a tongue or tonsils that are larger than normal. What are the signs or symptoms? Trouble staying asleep. Loud snoring. Headaches in the morning. Waking up gasping. Dry mouth or sore throat in the morning. Being sleepy or tired during the day. If you are sleepy or tired during the day, you may also: Not be able to focus your mind (concentrate). Forget things. Get angry a lot and have mood swings. Feel sad (depressed). Have changes in your personality. Have less interest in sex, if you are male. Be unable to have an erection, if you are male. How is this  treated?  Sleeping on your side. Using a medicine to get rid of mucus in your nose (decongestant). Avoiding the use of alcohol, medicines to help you relax, or certain pain medicines (narcotics). Losing weight, if needed. Changing your diet. Quitting smoking. Using a machine to open your airway while you sleep, such as: An oral appliance. This is a mouthpiece that shifts your lower jaw forward. A CPAP device. This device blows air through a mask when you breathe out (exhale). An EPAP device. This has valves that you put in each nostril. A BIPAP device. This device blows air through a mask when you breathe in (inhale) and breathe out. Having surgery if other treatments do not work. Follow these instructions at home: Lifestyle Make changes that your doctor recommends. Eat a healthy diet. Lose weight if needed. Avoid alcohol, medicines to help you relax, and some pain medicines. Do not smoke or use any products that contain nicotine or tobacco. If you need help quitting, ask your doctor. General instructions Take over-the-counter and prescription medicines only as told by your doctor. If you were given a machine to use while you sleep, use it only as told by your doctor. If you are having surgery, make sure to tell your doctor you have sleep apnea. You may need to bring your device with you. Keep all follow-up visits. Contact a doctor if: The machine that you were given to use during sleep bothers you or does not seem to be working. You do not get better. You get worse. Get help right away if: Your chest hurts. You have trouble breathing in enough air. You have an  uncomfortable feeling in your back, arms, or stomach. You have trouble talking. One side of your body feels weak. A part of your face is hanging down. These symptoms may be an emergency. Get help right away. Call your local emergency services (911 in the [place] Do not wait to see if the symptoms will go away. Do not  drive yourself to the hospital. Summary This condition affects breathing during sleep. The most common cause is a collapsed or blocked airway. The goal of treatment is to help you breathe normally while you sleep. This information is not intended to replace advice given to you by your health care provider. Make sure you discuss any questions you have with your health care provider. Document Revised: 01/02/2021 Document Reviewed: 05/04/2020 Elsevier Patient Education  2024 [employer].

## 2023-05-13 ENCOUNTER — Telehealth: Payer: Self-pay | Admitting: Cardiovascular Disease

## 2023-05-13 MED ORDER — VALSARTAN 80 MG PO TABS: 80.0000 mg | ORAL_TABLET | Freq: Every day | ORAL | 3 refills | Status: DC

## 2023-05-13 NOTE — Telephone Encounter (Signed)
*  STAT* If patient is at the pharmacy, call can be transferred to refill team.   1. Which medications need to be refilled? (please list name of each medication and dose if known) valsartan (DIOVAN) 80 MG tablet   2. Which pharmacy/location (including street and city if local pharmacy) is medication to be sent to?  [facility] - [place], [place] - [address]    3. Do they need a 30 day or 90 day supply? 90

## 2023-05-14 ENCOUNTER — Ambulatory Visit
Admission: RE | Admit: 2023-05-14 | Discharge: 2023-05-14 | Disposition: A | Discharge status: Home / Self Care | Payer: PPO | Source: Ambulatory Visit | Attending: Internal Medicine | Admitting: Internal Medicine

## 2023-05-14 DIAGNOSIS — I700 Atherosclerosis of aorta: Secondary | ICD-10-CM | POA: Diagnosis not present

## 2023-05-14 DIAGNOSIS — J439 Emphysema, unspecified: Secondary | ICD-10-CM | POA: Diagnosis not present

## 2023-05-14 DIAGNOSIS — C3432 Malignant neoplasm of lower lobe, left bronchus or lung: Secondary | ICD-10-CM | POA: Insufficient documentation

## 2023-05-14 DIAGNOSIS — C3490 Malignant neoplasm of unspecified part of unspecified bronchus or lung: Secondary | ICD-10-CM | POA: Diagnosis not present

## 2023-05-14 DIAGNOSIS — K769 Liver disease, unspecified: Secondary | ICD-10-CM | POA: Diagnosis not present

## 2023-05-14 LAB — POCT I-STAT, CHEM 8
BUN: 15 mg/dL (ref 8–23)
Calcium, Ion: 1.24 mmol/L (ref 1.15–1.40)
Chloride: 104 mmol/L (ref 98–111)
Creatinine, Ser: 1.1 mg/dL (ref 0.61–1.24)
Glucose, Bld: 99 mg/dL (ref 70–99)
HCT: 45 % (ref 39.0–52.0)
Hemoglobin: 15.3 g/dL (ref 13.0–17.0)
Potassium: 4.5 mmol/L (ref 3.5–5.1)
Sodium: 140 mmol/L (ref 135–145)
TCO2: 26 mmol/L (ref 22–32)

## 2023-05-14 MED ORDER — IOHEXOL 300 MG/ML  SOLN
100.0000 mL | Freq: Once | INTRAMUSCULAR | Status: AC | PRN
  Administered 2023-05-14: 100 mL via INTRAVENOUS

## 2023-05-20 ENCOUNTER — Inpatient Hospital Stay: Payer: PPO | Attending: Internal Medicine

## 2023-05-20 ENCOUNTER — Encounter: Payer: Self-pay | Admitting: Internal Medicine

## 2023-05-20 ENCOUNTER — Inpatient Hospital Stay (HOSPITAL_BASED_OUTPATIENT_CLINIC_OR_DEPARTMENT_OTHER): Payer: PPO | Admitting: Internal Medicine

## 2023-05-20 VITALS — BP 121/61 | HR 74 | Temp 98.0°F | Ht 68.0 in | Wt 216.8 lb

## 2023-05-20 DIAGNOSIS — Z452 Encounter for adjustment and management of vascular access device: Secondary | ICD-10-CM | POA: Diagnosis not present

## 2023-05-20 DIAGNOSIS — C3432 Malignant neoplasm of lower lobe, left bronchus or lung: Secondary | ICD-10-CM

## 2023-05-20 DIAGNOSIS — Z87891 Personal history of nicotine dependence: Secondary | ICD-10-CM | POA: Diagnosis not present

## 2023-05-20 DIAGNOSIS — Z7902 Long term (current) use of antithrombotics/antiplatelets: Secondary | ICD-10-CM | POA: Insufficient documentation

## 2023-05-20 DIAGNOSIS — Z7982 Long term (current) use of aspirin: Secondary | ICD-10-CM | POA: Diagnosis not present

## 2023-05-20 DIAGNOSIS — Z79899 Other long term (current) drug therapy: Secondary | ICD-10-CM | POA: Diagnosis not present

## 2023-05-20 DIAGNOSIS — I2510 Atherosclerotic heart disease of native coronary artery without angina pectoris: Secondary | ICD-10-CM | POA: Insufficient documentation

## 2023-05-20 DIAGNOSIS — Z95828 Presence of other vascular implants and grafts: Secondary | ICD-10-CM

## 2023-05-20 LAB — CMP (CANCER CENTER ONLY)
ALT: 16 U/L (ref 0–44)
AST: 17 U/L (ref 15–41)
Albumin: 4 g/dL (ref 3.5–5.0)
Alkaline Phosphatase: 54 U/L (ref 38–126)
Anion gap: 7 (ref 5–15)
BUN: 15 mg/dL (ref 8–23)
CO2: 25 mmol/L (ref 22–32)
Calcium: 9.4 mg/dL (ref 8.9–10.3)
Chloride: 106 mmol/L (ref 98–111)
Creatinine: 0.93 mg/dL (ref 0.61–1.24)
GFR, Estimated: >60 mL/min (ref >60)
Glucose, Bld: 94 mg/dL (ref 70–99)
Potassium: 3.9 mmol/L (ref 3.5–5.1)
Sodium: 138 mmol/L (ref 135–145)
Total Bilirubin: 0.9 mg/dL (ref <1.2)
Total Protein: 6.6 g/dL (ref 6.5–8.1)

## 2023-05-20 LAB — CBC (CANCER CENTER ONLY)
HCT: 42.6 % (ref 39.0–52.0)
Hemoglobin: 14.6 g/dL (ref 13.0–17.0)
MCH: 31.2 pg (ref 26.0–34.0)
MCHC: 34.3 g/dL (ref 30.0–36.0)
MCV: 91 fL (ref 80.0–100.0)
Platelet Count: 228 10*3/uL (ref 150–400)
RBC: 4.68 MIL/uL (ref 4.22–5.81)
RDW: 13 % (ref 11.5–15.5)
WBC Count: 6.6 10*3/uL (ref 4.0–10.5)
nRBC: 0 % (ref 0.0–0.2)

## 2023-05-20 LAB — TSH: TSH: 3.236 u[IU]/mL (ref 0.350–4.500)

## 2023-05-20 MED ORDER — HEPARIN SOD (PORK) LOCK FLUSH 100 UNIT/ML IV SOLN
500.0000 [IU] | Freq: Once | INTRAVENOUS | Status: AC
  Administered 2023-05-20: 500 [IU] via INTRAVENOUS
  Filled 2023-05-20: qty 5

## 2023-05-20 MED ORDER — SODIUM CHLORIDE 0.9% FLUSH
10.0000 mL | INTRAVENOUS | Status: DC | PRN
  Administered 2023-05-20: 10 mL via INTRAVENOUS
  Filled 2023-05-20: qty 10

## 2023-05-20 NOTE — Assessment & Plan Note (Addendum)
#   LLL- Lung cancer-non-small cell stage IV [right suprarenal nodule-s/p biopsy "non-small cell ca"].  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  Last Rande Lawman was July, 27th, 2023.   #  CAP CT scan 05/14/2023-  No acute intrathoracic, abdominal, or pelvic pathology. No evidence of recurrent or metastatic disease;  Indeterminate 15 mm right renal upper pole exophytic lesion- see below.    # Discontinue further therapy- given overall stable disease. Continue surveillance imaging in 6  months.  Discussed regarding port explantation  #  incidental CT DE  2024- 1.5. Hyperattenuating exophytic lesion of the upper pole of the right kidney is new when compared with Oct 08, 2020 prior and concerning for a small RCC. Consider contrast-enhanced abdominal MRI for definitive characterization.  Patient/daughter declined.  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  Likely contributing to patient's cognitive deficits- [Dr.vaslow] - worse as per pt's daughter- defer to Neurology.     # CAD-s/p CABG-/PVD-s/p stent [Dr.Schneir]-improvement in pain noted.  On Plavix -stable.  # IV access: Mediport functioning-discussed regarding port explantation as patient is not a candidate for any further treatment even if patient has recurrent disease.  Daughter symptomatic event however she discussed with family and will confirm.   #Incidental findings on Imaging  CT , DEC  2024: Aortic Atherosclerosis ; Emphysema   I reviewed/discussed/counseled the patient.   I spoke at length with the patient's  daughter-  regarding the patient's clinical status/plan of care.  Family agreement.   # DISPOSITION: # refer to IR re: port explanation [pt not decided yet; so keep port flushes for now] # port flush in 3 months # follow up in 6 months  with MD; port/ labs-cbc/cmp;TSH- No chemo; CT CAP-- Dr.B  # I reviewed the blood work- with the patient in detail; also reviewed the imaging independently [as summarized above]; and with the patient in detail.

## 2023-05-20 NOTE — Progress Notes (Signed)
[facility] CONSULT NOTE  Patient Care Team: Eustaquio Boyden, MD as PCP - General (Family Medicine) Mariah Milling Tollie Pizza, MD as PCP - Cardiology (Cardiology) Sherlene Shams, MD (Internal Medicine) Antonieta Iba, MD as Consulting Physician (Cardiology) Drema Dallas, DO as Consulting Physician (Neurology) Earna Coder, MD as Consulting Physician (Hematology and Oncology) Glory Buff, RN as Oncology Nurse Navigator  CHIEF COMPLAINTS/PURPOSE OF CONSULTATION: lung cancer    Oncology History Overview Note  #MAY 2022-Lung cancer-non-small cell [CT guided bx] T3N1 vs stage IV Dr.Hendrickson.  MRI brain negative for malignancy.# 1. April 2022- LLL ~4.0 cm mass in the superior segment left lower lobe abuts the major fissure and the posterior pleural surface without visible chest wall invasion without pleural effusion. There 2-3 other small nodules in the left lower lobe, largest measures 9 mm in diameter. These are suspicious for same lobe satellite lesions and there is left hilar adenopathy. Assuming non-small cell lung cancer the appearance is compatible with T3 N1 M0 disease (stage IIIA).  # right suprarenal nodule-awaiting biopsy on 5/31- non-small cell [revived at tumor conference at [facility]]  # June 9th 2022- CARBO-TAXOl-KEYTRUDA; Fulphila  # ? Subtle adrenal insufficiency- No acute process - -September MRI brain negative for any pituitary hypophysitis/brain metastasis. Prednisone   MOLECULAR TESTING: NGS TPS-PDL 100%; EXON 12 amplification* mutations.  # CAD [CABG 2016; Dr.Gollan]   Cancer of lower lobe of left lung (HCC)  11/02/2020 Initial Diagnosis   Cancer of lower lobe of left lung (HCC)   11/02/2020 Cancer Staging   Staging form: Lung, AJCC 8th Edition - Clinical: Stage IVA (cT3, cN1, cM1a) - Signed by Earna Coder, MD on 11/02/2020   11/16/2020 - 01/02/2022 Chemotherapy   Patient is on Treatment Plan : LUNG NSCLC Carboplatin + Paclitaxel +  Pembrolizumab q21d x 4 cycles / Pembrolizumab Maintenance Q21D     11/16/2020 -  Chemotherapy   Patient is on Treatment Plan : LUNG NSCLC Carboplatin (6) + Paclitaxel (200) + Pembrolizumab (200) D1 q21d x 4 cycles / Pembrolizumab (200) Maintenance D1 q21d      HISTORY OF PRESENTING ILLNESS: Accompanied by daughter.  Walking independently.  Doroteo Bradford. 82 y.o.  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   Daughter denies any worsening shortness of breath or cough.  No constipation or diarrhea or abdominal pain.  He  Denies any worsening cough. NO new shortness of breath. However,  patient continues to have intermittent cognitive deficits.  As per the daughter neuro cognitively worse.  He is currently living with his daughter in [place].     Review of Systems  Constitutional:  Positive for malaise/fatigue. Negative for chills, diaphoresis, fever and weight loss.  HENT:  Negative for nosebleeds and sore throat.   Eyes:  Negative for double vision.  Respiratory:  Negative for cough, hemoptysis, sputum production, shortness of breath and wheezing.   Cardiovascular:  Negative for chest pain, palpitations, orthopnea and leg swelling.  Gastrointestinal:  Negative for abdominal pain, blood in stool, constipation, diarrhea, heartburn, melena and vomiting.  Genitourinary:  Negative for dysuria, frequency and urgency.  Musculoskeletal:  Positive for back pain and joint pain.  Skin: Negative.  Negative for itching and rash.  Neurological:  Positive for dizziness and tremors. Negative for tingling, focal weakness,  weakness and headaches.  Endo/Heme/Allergies:  Does not bruise/bleed easily.  Psychiatric/Behavioral:  Positive for memory loss. Negative for depression. The patient is  not nervous/anxious and does not have insomnia.      MEDICAL HISTORY:  Past Medical History:  Diagnosis Date   Allergy    seasonal   Arthritis    all over- in general    CAD (coronary artery disease)    a. inferior wall MI 10/01 s/p PCI/DES to RCA; b. Myoview 3/16 neg for ischemia; c. LHC 8/16: ostLAD 80%, OM1 70%, OM2 70% x 2 lesions, mRCA 30%, dRCA 70% s/p 4-V CABG 01/24/15 (LIMA-LAD, VG- OM1, VG-OM2, VG-PDA)    Cancer (HCC)    skin, melanoma   Carotid artery disease (HCC)    a. [place] 8/16: 1-39% bilateral ICA stenosis   Cataract    removed   Diastolic dysfunction    a. 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   stent RCA   Lung cancer (HCC)    Postoperative wound infection 02/02/2015   Reflux esophagitis    Sleep apnea 2017   CPAP at night    SURGICAL HISTORY: Past Surgical History:  Procedure Laterality Date   arm surgery  2010   BROW LIFT Bilateral 11/25/2019   Procedure: BROW PTOSIS REPAIR BILATERAL;  Surgeon: Imagene Riches, MD;  Location: [facility];  Service: Ophthalmology;  Laterality: Bilateral;  sleep apnea   CARDIAC CATHETERIZATION  06/24/2011   CARDIAC CATHETERIZATION N/A 01/18/2015   Procedure: Left Heart Cath with coronary angiography;  Surgeon: Antonieta Iba, MD;  Location: [facility];  Service: Cardiovascular;  Laterality: N/A;   CARDIAC CATHETERIZATION N/A 01/18/2015   Procedure: Intravascular Pressure Wire/FFR Study;  Surgeon: Iran Ouch, MD;  Location: [facility];  Service: Cardiovascular;  Laterality: N/A;   CAROTID STENT  03/10/2011   COLONOSCOPY  2010   COLONOSCOPY  06/14/2014   Dr Rhea Belton   COLONOSCOPY  01/2022   done for colon thickening at splenic  flexure - 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[name])   CORONARY ARTERY BYPASS GRAFT N/A 01/24/2015   Procedure: CORONARY ARTERY BYPASS GRAFTING x 4 (LIMA-LAD, SVG-Int 1- Int 2, SVG-PD) ENDOSCOPIC GREATER SAPHENOUS VEIN HARVEST LEFT LEG;  Surgeon: Delight Ovens, MD;  Location: [facility] OR;  Service: Open Heart Surgery;  Laterality: N/A;   EMBOLECTOMY  06/15/2019   Procedure: EMBOLECTOMY;  Surgeon: Renford Dills, MD;  Location: [facility];  Service: Vascular;;  right superficial femoral artery   ENDARTERECTOMY FEMORAL Right 06/15/2019   Procedure: ENDARTERECTOMY FEMORAL;  Surgeon: Renford Dills, MD;  Location: [facility];  Service: Vascular;  Laterality: Right;  common femoral profunda femoris superficial femoral   ESOPHAGOGASTRODUODENOSCOPY (EGD) WITH PROPOFOL N/A 04/24/2016   Procedure: ESOPHAGOGASTRODUODENOSCOPY (EGD) WITH PROPOFOL;  Surgeon: Beverley Fiedler, MD;  Location: [facility];  Service: Gastroenterology;  Laterality: N/A;   EYE SURGERY     lasik 15 yrs. ago, cataracts removed - both eyes    HAMMER TOE SURGERY     right toe   INSERTION OF ILIAC STENT Right 06/15/2019   Procedure: INSERTION OF ILIAC STENT ( STENTING OF SFA/POP ARTERY );  Surgeon: Renford Dills, MD;  Location: [facility];  Service: Vascular;  Laterality: Right;  angioplpasty and stent placement: right superficial femoral right tibiopopliteal trunk bilateral common iliac arteries   IR IMAGING GUIDED PORT INSERTION  11/09/2020   LEFT HEART CATH AND CORONARY ANGIOGRAPHY Left 06/10/2017   Procedure: LEFT HEART CATH AND CORONARY ANGIOGRAPHY;  Surgeon: Antonieta Iba, MD;  Location: [facility];  Service: Cardiovascular;  Laterality: Left;   LOWER EXTREMITY ANGIOGRAPHY Left 01/04/2019   Procedure: LOWER EXTREMITY ANGIOGRAPHY;  Surgeon: Renford Dills, MD;  Location: [facility];  Service: Cardiovascular;  Laterality: Left;   LOWER EXTREMITY ANGIOGRAPHY Right 01/25/2019   Procedure:  LOWER EXTREMITY ANGIOGRAPHY;  Surgeon: Renford Dills, MD;  Location: [facility];  Service: Cardiovascular;  Laterality: Right;   LOWER EXTREMITY ANGIOGRAPHY Right 01/15/2021   LOWER EXTREMITY ANGIOGRAPHY and stent placement to R SFA and popliteal artery Gilda Crease, Latina Craver, MD)   NASAL SINUS SURGERY  2008   septpolasty, bilateral turbinate reduction   SHOULDER ARTHROSCOPY  2012   TEE WITHOUT CARDIOVERSION N/A 01/24/2015   Procedure: TRANSESOPHAGEAL ECHOCARDIOGRAM (TEE);  Surgeon: Delight Ovens, MD;  Location: [facility];  Service: Open Heart Surgery;  Laterality: N/A;   TOE SURGERY  1994   UPPER GI ENDOSCOPY  07/2014, 04-24-16   Dr Margretta Sidle   WRIST SURGERY  2011    SOCIAL HISTORY: Social History   Socioeconomic History   Marital status: Single    Spouse name: Not on file   Number of children: Not on file   Years of education: 12   Highest education level: 12th grade  Occupational History   Occupation: retired    Comment: Pensions consultant  Tobacco Use   Smoking status: Former    Current packs/day: 0.00    Average packs/day: 2.5 packs/day for 40.0 years (100.0 ttl pk-yrs)    Types: Cigarettes    Start date: 03/24/1960    Quit date: 03/24/2000    Years since quitting: 23.1   Smokeless tobacco: Never  Vaping Use   Vaping status: Never Used  Substance and Sexual Activity   Alcohol use: Yes    Comment: occassionally   Drug use: No   Sexual activity: Yes  Other Topics Concern   Not on file  Social History Narrative   Singled; lives with son and dog    Occ: retired, back part time at [employer];    Activity: gym 4-5d/wk   Diet: good water, fruits/vegetables daily   Caffeine Use-yes      H/o Financial planner   ------------------------------------       Retail banker- retd; [employer] store- retd; quit smoking 2001. Alcohol couple nights a week. Live in [place]. Daughter lives 10 mins.    Social Determinants of Health   Financial Resource Strain: Low Risk  (11/24/2022)    Overall Financial Resource Strain (CARDIA)    Difficulty of Paying Living Expenses: Not hard at all  Food Insecurity: No Food Insecurity (11/24/2022)   Hunger Vital Sign    Worried About Running Out of Food in the Last Year: Never true    Ran Out of Food in the Last Year: Never true  Transportation Needs: No Transportation Needs (11/24/2022)   PRAPARE - Administrator, Civil Service (Medical): No    Lack of Transportation (Non-Medical): No  Physical Activity: Sufficiently Active (11/24/2022)   Exercise Vital Sign    Days of Exercise per Week: 7 days    Minutes of Exercise per Session:  40 min  Recent Concern: Physical Activity - Insufficiently Active (11/04/2022)   Exercise Vital Sign    Days of Exercise per Week: 5 days    Minutes of Exercise per Session: 10 min  Stress: No Stress Concern Present (11/24/2022)   [employer] of Occupational Health - Occupational Stress Questionnaire    Feeling of Stress : Not at all  Social Connections: Moderately Isolated (11/24/2022)   Social Connection and Isolation Panel [NHANES]    Frequency of Communication with Friends and Family: More than three times a week    Frequency of Social Gatherings with Friends and Family: More than three times a week    Attends Religious Services: More than 4 times per year    Active Member of [employer] or Organizations: No    Attends Banker Meetings: Never    Marital Status: Divorced  Catering manager Violence: Not At Risk (11/24/2022)   Humiliation, Afraid, Rape, and Kick questionnaire    Fear of Current or Ex-Partner: No    Emotionally Abused: No    Physically Abused: No    Sexually Abused: No    FAMILY HISTORY: Family History  Problem Relation Age of Onset   Hypertension Mother    Heart disease Mother    Hypertension Father    Diabetes Father    Lymphoma Sister    Heart disease Brother 69   Cancer Paternal Grandfather    Colon cancer Neg Hx    Prostate cancer Neg Hx    Bladder  Cancer Neg Hx    Kidney cancer Neg Hx    Colon polyps Neg Hx    Esophageal cancer Neg Hx    Pancreatic cancer Neg Hx    Stomach cancer Neg Hx     ALLERGIES:  has No Known Allergies.  MEDICATIONS:  Current Outpatient Medications  Medication Sig Dispense Refill   acetaminophen (TYLENOL) 325 MG tablet Take 650 mg by mouth as needed for moderate pain or mild pain.     albuterol (VENTOLIN HFA) 108 (90 Base) MCG/ACT inhaler Inhale 2 puffs into the lungs every 6 (six) hours as needed for wheezing or shortness of breath. 18 g 2   Ascorbic Acid (VITAMIN C) 1000 MG tablet Take 1 tablet (1,000 mg total) by mouth daily.     aspirin 81 MG EC tablet Take 81 mg by mouth daily.       benzonatate (TESSALON) 200 MG capsule Take 1 capsule (200 mg total) by mouth 3 (three) times daily as needed for cough. 60 capsule 0   Calcium-Magnesium-Vitamin D (CALCIUM 1200+D3 PO) Take 1 tablet by mouth daily.     Carboxymethylcellul-Glycerin (LUBRICATING EYE DROPS OP) Place 1 drop into both eyes daily as needed (irritation).     cetirizine (ZYRTEC) 10 MG chewable tablet Chew 10 mg by mouth daily.     Cholecalciferol (VITAMIN D3) 50 MCG (2000 UT) TABS Take 2,000 Units by mouth daily. 30 tablet    Cyanocobalamin (B-12) 5000 MCG CAPS Take 5,000 mcg by mouth daily.     docusate sodium (COLACE) 100 MG capsule Take 200 mg by mouth at bedtime.     finasteride (PROSCAR) 5 MG tablet TAKE 1 TABLET BY MOUTH DAILY 90 tablet 2   fluticasone (FLONASE) 50 MCG/ACT nasal spray Place 2 sprays into both nostrils daily.     guaiFENesin (MUCINEX) 600 MG 12 hr tablet Take by mouth 2 (two) times daily.     lidocaine-prilocaine (EMLA) cream Apply 30 -45 mins prior to port access.  30 g 3   Melatonin 10 MG CAPS Take 10 mg by mouth at bedtime as needed (sleep).     nitroGLYCERIN (NITROSTAT) 0.4 MG SL tablet Place 1 tablet (0.4 mg total) under the tongue every 5 (five) minutes as needed for chest pain. 25 tablet 3   pantoprazole (PROTONIX) 40 MG  tablet Take 1 tablet (40 mg total) by mouth daily. 90 tablet 1   polyethylene glycol powder (GLYCOLAX/MIRALAX) 17 GM/SCOOP powder Take 8.5-17 g by mouth daily as needed for moderate constipation. 510 g 1   prochlorperazine (COMPAZINE) 10 MG tablet Take 1 tablet (10 mg total) by mouth every 6 (six) hours as needed for nausea or vomiting. 40 tablet 1   simvastatin (ZOCOR) 40 MG tablet Take 1 tablet (40 mg total) by mouth at bedtime. 90 tablet 3   valsartan (DIOVAN) 80 MG tablet Take 1 tablet (80 mg total) by mouth daily. 90 tablet 3   Vitamin E 450 MG (1000 UT) CAPS Take 450 Units by mouth daily.     Zinc 50 MG TABS Take 50 mg by mouth daily.     ezetimibe (ZETIA) 10 MG tablet Take 1 tablet (10 mg total) by mouth daily. 90 tablet 3   No current facility-administered medications for this visit.   Facility-Administered Medications Ordered in Other Visits  Medication Dose Route Frequency Provider Last Rate Last Admin   heparin lock flush 100 UNIT/ML injection            sodium chloride flush (NS) 0.9 % injection 10 mL  10 mL Intravenous PRN Earna Coder, MD   10 mL at 05/20/23 0911      .  PHYSICAL EXAMINATION: ECOG PERFORMANCE STATUS: 0 - Asymptomatic  Vitals:   05/20/23 0914  BP: 121/61  Pulse: 74  Temp: 98 F (36.7 C)  SpO2: 95%      Filed Weights   05/20/23 0914  Weight: 216 lb 12.8 oz (98.3 kg)        Physical Exam HENT:     Head: Normocephalic and atraumatic.     Mouth/Throat:     Pharynx: No oropharyngeal exudate.  Eyes:     Pupils: Pupils are equal, round, and reactive to light.  Cardiovascular:     Rate and Rhythm: Normal rate and regular rhythm.  Pulmonary:     Effort: No respiratory distress.     Breath sounds: No wheezing.     Comments: Decreased air entry bilaterally. Abdominal:     General: Bowel sounds are normal. There is no distension.     Palpations: Abdomen is soft. There is no mass.     Tenderness: There is no abdominal tenderness.  There is no guarding or rebound.  Musculoskeletal:        General: No tenderness. Normal range of motion.     Cervical back: Normal range of motion and neck supple.  Skin:    General: Skin is warm.  Neurological:     Mental Status: He is alert and oriented to person, place, and time.  Psychiatric:        Mood and Affect: Affect normal.      LABORATORY DATA:  I have reviewed the data as listed Lab Results  Component Value Date   WBC 6.6 05/20/2023   HGB 14.6 05/20/2023   HCT 42.6 05/20/2023   MCV 91.0 05/20/2023   PLT 228 05/20/2023   Recent Labs    07/18/22 1012 11/05/22 1021 11/19/22 0948 05/14/23 1048 05/20/23 6283  NA 137 139 140 140 138  K 4.1 4.0 4.0 4.5 3.9  CL 103 103 108 104 106  CO2 24 26 25   --  25  GLUCOSE 79 86 115* 99 94  BUN 19 15 13 15 15   CREATININE 1.22 1.05 0.97 1.10 0.93  CALCIUM 9.3 10.2 8.3*  --  9.4  GFRNONAA 60*  --  >60  --  >60  PROT 7.1 7.5 6.9  --  6.6  ALBUMIN 4.2 4.5 4.2  --  4.0  AST 20 16 18   --  17  ALT 21 14 15   --  16  ALKPHOS 57 61 51  --  54  BILITOT 0.7 0.6 0.7  --  0.9    RADIOGRAPHIC STUDIES: I have personally reviewed the radiological images as listed and agreed with the findings in the report. CT CHEST ABDOMEN PELVIS W CONTRAST  Result Date: 05/19/2023 CLINICAL DATA:  Metastatic non-small cell lung cancer. Assessment treatment response. EXAM: CT CHEST, ABDOMEN, AND PELVIS WITH CONTRAST TECHNIQUE: Multidetector CT imaging of the chest, abdomen and pelvis was performed following the standard protocol during bolus administration of intravenous contrast. RADIATION DOSE REDUCTION: This exam was performed according to the departmental dose-optimization program which includes automated exposure control, adjustment of the mA and/or kV according to patient size and/or use of iterative reconstruction technique. CONTRAST:  OMNIPAQUE IOHEXOL 300 MG/ML  SOLN COMPARISON:  Chest CT dated 11/14/2022. FINDINGS: CT CHEST FINDINGS  Cardiovascular: There is no cardiomegaly or pericardial effusion. There is advanced 3 vessel coronary vascular calcification and postsurgical changes of CABG. Moderate atherosclerotic calcification of the thoracic aorta. No aneurysmal dilatation or dissection. The origins of the great vessels of the aortic arch and the central pulmonary arteries appear patent. Right-sided Port-A-Cath with tip at the cavoatrial junction. Mediastinum/Nodes: No hilar or mediastinal adenopathy. The esophagus is grossly unremarkable. No mediastinal fluid collection. Lungs/Pleura: Subcentimeter right upper lobe partially calcified granuloma as seen on the prior CT. No focal consolidation, pleural effusion, or pneumothorax. No suspicious lesion. There is mild paraseptal emphysema. The central airways are patent. Musculoskeletal: Degenerative changes of the spine. Median sternotomy wires. No acute osseous pathology. CT ABDOMEN PELVIS FINDINGS No intra-abdominal free air or free fluid. Hepatobiliary: Small cyst in the left lobe of the liver and additional subcentimeter hypodense lesions which are too small to characterize. No biliary dilatation. The gallbladder is unremarkable. Pancreas: Unremarkable. No pancreatic ductal dilatation or surrounding inflammatory changes. Spleen: Normal in size without focal abnormality. Adrenals/Urinary Tract: The adrenal glands are unremarkable. Indeterminate 15 mm exophytic hypodense lesion with probable peripheral calcification from the upper pole of the right kidney. Further characterization with MRI without and with contrast as per recommendation of prior CT and if not artery performed is advised. There is no hydronephrosis on either side. There is symmetric enhancement and excretion of contrast by both kidneys. The visualized ureters and urinary bladder appear unremarkable. Stomach/Bowel: There is moderate stool throughout the colon. There is no bowel obstruction or active inflammation. The appendix is  normal. Vascular/Lymphatic: Advanced aortoiliac atherosclerotic disease. The IVC is unremarkable. No portal venous gas. There is no adenopathy. Reproductive: The prostate and seminal vesicles are grossly unremarkable. No pelvic mass. Other: Small fat containing left inguinal hernia. Musculoskeletal: Osteopenia with degenerative changes of the spine. No acute osseous pathology. IMPRESSION: 1. No acute intrathoracic, abdominal, or pelvic pathology. No evidence of recurrent or metastatic disease. 2. Indeterminate 15 mm right renal upper pole exophytic lesion. Further characterization with MRI without and  with contrast recommended. 3. Aortic Atherosclerosis (ICD10-I70.0) and Emphysema (ICD10-J43.9). Electronically Signed   By: Elgie Collard M.D.   On: 05/19/2023 16:10     ASSESSMENT & PLAN:   Cancer of lower lobe of left lung (HCC) # LLL- Lung cancer-non-small cell stage IV [right suprarenal nodule-s/p biopsy "non-small cell ca"].  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  Last Rande Lawman was July, 27th, 2023.   #  CAP CT scan 05/14/2023-  No acute intrathoracic, abdominal, or pelvic pathology. No evidence of recurrent or metastatic disease;  Indeterminate 15 mm right renal upper pole exophytic lesion- see below.    # Discontinue further therapy- given overall stable disease. Continue surveillance imaging in 6  months.  Discussed regarding port explantation  #  incidental CT DE  2024- 1.5. Hyperattenuating exophytic lesion of the upper pole of the right kidney is new when compared with Oct 08, 2020 prior and concerning for a small RCC. Consider contrast-enhanced abdominal MRI for definitive characterization.  Patient/daughter declined.  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  Likely contributing to  patient's cognitive deficits- [Dr.vaslow] - worse as per pt's daughter- defer to Neurology.     # CAD-s/p CABG-/PVD-s/p stent [Dr.Schneir]-improvement in pain noted.  On Plavix -stable.  # IV access: Mediport functioning-discussed regarding port explantation as patient is not a candidate for any further treatment even if patient has recurrent disease.  Daughter symptomatic event however she discussed with family and will confirm.   #Incidental findings on Imaging  CT , DEC  2024: Aortic Atherosclerosis ; Emphysema   I reviewed/discussed/counseled the patient.   I spoke at length with the patient's  daughter-  regarding the patient's clinical status/plan of care.  Family agreement.   # DISPOSITION: # refer to IR re: port explanation [pt not decided yet; so keep port flushes for now] # port flush in 3 months # follow up in 6 months with MD; port/ labs-cbc/cmp;TSH- No chemo; CT CAP-- Dr.B  # I reviewed the blood work- with the patient in detail; also reviewed the imaging independently [as summarized above]; and with the patient in detail.    All  questions were answered. The patient knows to call the clinic with any problems, questions or concerns.    Earna Coder, MD 05/20/2023 9:41 AM

## 2023-05-20 NOTE — Progress Notes (Signed)
CT CAP 05/14/23.

## 2023-05-21 ENCOUNTER — Ambulatory Visit: Payer: PPO | Admitting: Internal Medicine

## 2023-05-21 ENCOUNTER — Other Ambulatory Visit: Payer: PPO

## 2023-05-26 ENCOUNTER — Other Ambulatory Visit: Payer: Self-pay | Admitting: Family Medicine

## 2023-05-26 DIAGNOSIS — N401 Enlarged prostate with lower urinary tract symptoms: Secondary | ICD-10-CM

## 2023-08-18 ENCOUNTER — Inpatient Hospital Stay: Payer: PPO | Attending: Internal Medicine

## 2023-08-18 DIAGNOSIS — Z95828 Presence of other vascular implants and grafts: Secondary | ICD-10-CM

## 2023-08-18 DIAGNOSIS — C3432 Malignant neoplasm of lower lobe, left bronchus or lung: Secondary | ICD-10-CM | POA: Insufficient documentation

## 2023-08-18 DIAGNOSIS — Z79899 Other long term (current) drug therapy: Secondary | ICD-10-CM | POA: Diagnosis not present

## 2023-08-18 DIAGNOSIS — Z452 Encounter for adjustment and management of vascular access device: Secondary | ICD-10-CM | POA: Diagnosis not present

## 2023-08-18 LAB — CMP (CANCER CENTER ONLY)
ALT: 16 U/L (ref 0–44)
AST: 17 U/L (ref 15–41)
Albumin: 3.8 g/dL (ref 3.5–5.0)
Alkaline Phosphatase: 53 U/L (ref 38–126)
Anion gap: 7 (ref 5–15)
BUN: 18 mg/dL (ref 8–23)
CO2: 24 mmol/L (ref 22–32)
Calcium: 9.1 mg/dL (ref 8.9–10.3)
Chloride: 105 mmol/L (ref 98–111)
Creatinine: 1.28 mg/dL — ABNORMAL HIGH (ref 0.61–1.24)
GFR, Estimated: 56 mL/min — ABNORMAL LOW (ref >60)
Glucose, Bld: 107 mg/dL — ABNORMAL HIGH (ref 70–99)
Potassium: 3.7 mmol/L (ref 3.5–5.1)
Sodium: 136 mmol/L (ref 135–145)
Total Bilirubin: 1 mg/dL (ref 0.0–1.2)
Total Protein: 6.8 g/dL (ref 6.5–8.1)

## 2023-08-18 LAB — CBC WITH DIFFERENTIAL (CANCER CENTER ONLY)
Abs Immature Granulocytes: 0.07 10*3/uL (ref 0.00–0.07)
Basophils Absolute: 0.1 10*3/uL (ref 0.0–0.1)
Basophils Relative: 1 %
Eosinophils Absolute: 0.2 10*3/uL (ref 0.0–0.5)
Eosinophils Relative: 2 %
HCT: 43.2 % (ref 39.0–52.0)
Hemoglobin: 14.7 g/dL (ref 13.0–17.0)
Immature Granulocytes: 1 %
Lymphocytes Relative: 17 %
Lymphs Abs: 1.4 10*3/uL (ref 0.7–4.0)
MCH: 31.1 pg (ref 26.0–34.0)
MCHC: 34 g/dL (ref 30.0–36.0)
MCV: 91.3 fL (ref 80.0–100.0)
Monocytes Absolute: 0.8 10*3/uL (ref 0.1–1.0)
Monocytes Relative: 10 %
Neutro Abs: 5.8 10*3/uL (ref 1.7–7.7)
Neutrophils Relative %: 69 %
Platelet Count: 217 10*3/uL (ref 150–400)
RBC: 4.73 MIL/uL (ref 4.22–5.81)
RDW: 12.7 % (ref 11.5–15.5)
WBC Count: 8.4 10*3/uL (ref 4.0–10.5)
nRBC: 0 % (ref 0.0–0.2)

## 2023-08-18 LAB — TSH: TSH: 1.544 u[IU]/mL (ref 0.350–4.500)

## 2023-08-18 MED ORDER — HEPARIN SOD (PORK) LOCK FLUSH 100 UNIT/ML IV SOLN
500.0000 [IU] | Freq: Once | INTRAVENOUS | Status: AC
  Administered 2023-08-18: 500 [IU] via INTRAVENOUS
  Filled 2023-08-18: qty 5

## 2023-08-18 MED ORDER — SODIUM CHLORIDE 0.9% FLUSH
10.0000 mL | Freq: Once | INTRAVENOUS | Status: AC
Start: 2023-08-18 — End: 2023-08-18
  Administered 2023-08-18: 10 mL via INTRAVENOUS
  Filled 2023-08-18: qty 10

## 2023-10-05 ENCOUNTER — Other Ambulatory Visit: Payer: Self-pay | Admitting: Pharmacist

## 2023-10-05 NOTE — Progress Notes (Signed)
 Pharmacy Quality Measure Review  This patient is appearing on a report for being at risk of failing the adherence measure for hypertension (ACEi/ARB) medications this calendar year.   Medication: valsartan  Last fill date: 07/14/23 for 30 day supply  Insurance report was not up to date. No action needed at this time.  Medication was filled a month late. Last fill date 09/15/23 x30 day supply.  Appears patient has been filling relatively consistently every other month. Used to be prescribed 40 mg daily, suspect he may be doing this still.   Called patient to clarify current dosing though no answer.  If intended to be taking 40 mg daily, would need new Rx to report this, otherwise will fail adherence measure for the year.

## 2023-11-10 ENCOUNTER — Ambulatory Visit
Admission: RE | Admit: 2023-11-10 | Discharge: 2023-11-10 | Disposition: A | Discharge status: Home / Self Care | Payer: PPO | Source: Ambulatory Visit | Attending: Internal Medicine | Admitting: Internal Medicine

## 2023-11-10 DIAGNOSIS — C3432 Malignant neoplasm of lower lobe, left bronchus or lung: Secondary | ICD-10-CM | POA: Diagnosis not present

## 2023-11-10 DIAGNOSIS — K4090 Unilateral inguinal hernia, without obstruction or gangrene, not specified as recurrent: Secondary | ICD-10-CM | POA: Diagnosis not present

## 2023-11-10 DIAGNOSIS — K7689 Other specified diseases of liver: Secondary | ICD-10-CM | POA: Diagnosis not present

## 2023-11-10 DIAGNOSIS — C7901 Secondary malignant neoplasm of right kidney and renal pelvis: Secondary | ICD-10-CM | POA: Diagnosis not present

## 2023-11-10 DIAGNOSIS — J432 Centrilobular emphysema: Secondary | ICD-10-CM | POA: Diagnosis not present

## 2023-11-10 MED ORDER — IOHEXOL 300 MG/ML  SOLN
100.0000 mL | Freq: Once | INTRAMUSCULAR | Status: AC | PRN
  Administered 2023-11-10: 100 mL via INTRAVENOUS

## 2023-11-20 ENCOUNTER — Inpatient Hospital Stay: Payer: PPO | Attending: Internal Medicine

## 2023-11-20 ENCOUNTER — Inpatient Hospital Stay: Payer: PPO | Admitting: Internal Medicine

## 2023-11-20 ENCOUNTER — Encounter: Payer: Self-pay | Admitting: Internal Medicine

## 2023-11-20 VITALS — BP 112/62 | HR 83 | Temp 98.0°F | Resp 18 | Ht 68.0 in | Wt 214.8 lb

## 2023-11-20 DIAGNOSIS — Z79899 Other long term (current) drug therapy: Secondary | ICD-10-CM | POA: Insufficient documentation

## 2023-11-20 DIAGNOSIS — Z95828 Presence of other vascular implants and grafts: Secondary | ICD-10-CM

## 2023-11-20 DIAGNOSIS — Z452 Encounter for adjustment and management of vascular access device: Secondary | ICD-10-CM | POA: Diagnosis not present

## 2023-11-20 DIAGNOSIS — C3432 Malignant neoplasm of lower lobe, left bronchus or lung: Secondary | ICD-10-CM

## 2023-11-20 LAB — CBC WITH DIFFERENTIAL (CANCER CENTER ONLY)
Abs Immature Granulocytes: 0.07 10*3/uL (ref 0.00–0.07)
Basophils Absolute: 0.1 10*3/uL (ref 0.0–0.1)
Basophils Relative: 1 %
Eosinophils Absolute: 0.1 10*3/uL (ref 0.0–0.5)
Eosinophils Relative: 2 %
HCT: 44 % (ref 39.0–52.0)
Hemoglobin: 15.1 g/dL (ref 13.0–17.0)
Immature Granulocytes: 1 %
Lymphocytes Relative: 19 %
Lymphs Abs: 1.4 10*3/uL (ref 0.7–4.0)
MCH: 31.1 pg (ref 26.0–34.0)
MCHC: 34.3 g/dL (ref 30.0–36.0)
MCV: 90.5 fL (ref 80.0–100.0)
Monocytes Absolute: 0.6 10*3/uL (ref 0.1–1.0)
Monocytes Relative: 9 %
Neutro Abs: 5.1 10*3/uL (ref 1.7–7.7)
Neutrophils Relative %: 68 %
Platelet Count: 217 10*3/uL (ref 150–400)
RBC: 4.86 MIL/uL (ref 4.22–5.81)
RDW: 12.8 % (ref 11.5–15.5)
WBC Count: 7.4 10*3/uL (ref 4.0–10.5)
nRBC: 0 % (ref 0.0–0.2)

## 2023-11-20 LAB — CMP (CANCER CENTER ONLY)
ALT: 16 U/L (ref 0–44)
AST: 17 U/L (ref 15–41)
Albumin: 4.2 g/dL (ref 3.5–5.0)
Alkaline Phosphatase: 60 U/L (ref 38–126)
Anion gap: 9 (ref 5–15)
BUN: 16 mg/dL (ref 8–23)
CO2: 22 mmol/L (ref 22–32)
Calcium: 9.1 mg/dL (ref 8.9–10.3)
Chloride: 107 mmol/L (ref 98–111)
Creatinine: 1.12 mg/dL (ref 0.61–1.24)
GFR, Estimated: >60 mL/min (ref >60)
Glucose, Bld: 103 mg/dL — ABNORMAL HIGH (ref 70–99)
Potassium: 4 mmol/L (ref 3.5–5.1)
Sodium: 138 mmol/L (ref 135–145)
Total Bilirubin: 0.8 mg/dL (ref 0.0–1.2)
Total Protein: 7 g/dL (ref 6.5–8.1)

## 2023-11-20 LAB — TSH: TSH: 4.232 u[IU]/mL (ref 0.350–4.500)

## 2023-11-20 MED ORDER — HEPARIN SOD (PORK) LOCK FLUSH 100 UNIT/ML IV SOLN
500.0000 [IU] | Freq: Once | INTRAVENOUS | Status: AC
Start: 2023-11-20 — End: 2023-11-20
  Administered 2023-11-20: 500 [IU] via INTRAVENOUS
  Filled 2023-11-20: qty 5

## 2023-11-20 MED ORDER — SODIUM CHLORIDE 0.9% FLUSH
10.0000 mL | Freq: Once | INTRAVENOUS | Status: AC
Start: 2023-11-20 — End: 2023-11-20
  Administered 2023-11-20: 10 mL via INTRAVENOUS
  Filled 2023-11-20: qty 10

## 2023-11-20 NOTE — Addendum Note (Signed)
 Addended by: Vivi Grit on: 11/20/2023 11:18 AM   Modules accepted: Orders

## 2023-11-20 NOTE — Progress Notes (Signed)
 CT CAP 11/10/23.

## 2023-11-20 NOTE — Progress Notes (Signed)
 I connected with Randall Ali. on 11/20/23 at 10:00 AM EDT by video enabled telemedicine visit and verified that I am speaking with the correct person using two identifiers.  I discussed the limitations, risks, security and privacy concerns of performing an evaluation and management service by telemedicine and the availability of in-person appointments. I also discussed with the patient that there may be a patient responsible charge related to this service. The patient expressed understanding and agreed to proceed.    Other persons participating in the visit and their role in the encounter: RN/medical reconciliation Patient's location: office Provider's location: home  Oncology History Overview Note  #MAY 2022-Lung cancer-non-small cell [CT guided bx] T3N1 vs stage IV Dr.Hendrickson.  MRI brain negative for malignancy.# 1. April 2022- LLL ~4.0 cm mass in the superior segment left lower lobe abuts the major fissure and the posterior pleural surface without visible chest wall invasion without pleural effusion. There 2-3 other small nodules in the left lower lobe, largest measures 9 mm in diameter. These are suspicious for same lobe satellite lesions and there is left hilar adenopathy. Assuming non-small cell lung cancer the appearance is compatible with T3 N1 M0 disease (stage IIIA).  # right suprarenal nodule-awaiting biopsy on 5/31- non-small cell [revived at tumor conference at [facility]]  # June 9th 2022- CARBO-TAXOl -KEYTRUDA ; Fulphila   # ? Subtle adrenal insufficiency- No acute process - -September MRI brain negative for any pituitary hypophysitis/brain metastasis. Prednisone    MOLECULAR TESTING: NGS TPS-PDL 100%; EXON 12 amplification* mutations.  # CAD [CABG 2016; Dr.Gollan]   Cancer of lower lobe of left lung (HCC)  11/02/2020 Initial Diagnosis   Cancer of lower lobe of left lung (HCC)   11/02/2020 Cancer Staging   Staging form: Lung, AJCC 8th Edition - Clinical: Stage IVA (cT3, cN1,  cM1a) - Signed by Kiylah Loyer R, MD on 11/02/2020   11/16/2020 - 01/02/2022 Chemotherapy   Patient is on Treatment Plan : LUNG NSCLC Carboplatin  + Paclitaxel  + Pembrolizumab  q21d x 4 cycles / Pembrolizumab  Maintenance Q21D     11/16/2020 - 12/26/2021 Chemotherapy   Patient is on Treatment Plan : LUNG NSCLC Carboplatin  (6) + Paclitaxel  (200) + Pembrolizumab  (200) D1 q21d x 4 cycles / Pembrolizumab  (200) Maintenance D1 q21d        Chief Complaint: lung cancer   History of present illness:Randall Ali. 83 y.o.  male with history of metastatic left lung cancer currently on surveillance is here for follow-up/review results of the CT scan.  Patient denies any symptoms of shortness of breath or cough.  Continues to have cognitive difficulties.  Observation/objective: Alert & oriented x 3. In No acute distress.   Assessment and plan: Cancer of lower lobe of left lung (HCC) # LLL- Lung cancer-non-small cell stage IV [right suprarenal nodule-s/p biopsy non-small cell ca].  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  Last keytruda  was July, 27th, 2023.   #  CAP CT scan JUNE 2025-  No acute intrathoracic, abdominal, or pelvic pathology. No evidence of recurrent or metastatic disease;  Indeterminate 15 mm right renal upper pole exophytic lesion- see below.    # Discontinue further therapy- given overall stable disease/poor tolerance to therapy-. Continue surveillance imaging in 6  months.     #  incidental CT DE  2024- 1.5. Hyperattenuating exophytic lesion of the upper pole of the right kidney is new when compared with Oct 08, 2020 prior and concerning for a small RCC.  Patient/daughter declined.JUNE 2025- stable- monitor for now.   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  Likely contributing  to patient's cognitive deficits- [Dr.vaslow] -overall stable as per daughter.  # CAD-s/p CABG-/PVD-s/p stent [Dr.Schneir]-improvement in pain noted.  On Plavix  -stable.  # IV access: Mediport functioning-discussed regarding port explantation as patient is not a candidate for any further treatment even if patient has recurrent disease.  Daughter symptomatic event however she discussed with family and will confirm.   #Incidental findings on Imaging  CT , DEC  2024: Aortic Atherosclerosis ; Emphysema   I reviewed/discussed/counseled the patient.   I spoke at length with the patient's  daughter-  regarding the patient's clinical status/plan of care.  Family agreement.   # DISPOSITION: # refer to IR re: port explanation [pt not decided yet; so keep port flushes for now] # port flush in 3 months # follow up in 6 months with MD; port/ labs-cbc/cmp;TSH- No chemo; CT CAP-- Dr.B  # I reviewed the blood work- with the patient in detail; also reviewed the imaging independently [as summarized above]; and with the patient in detail.     Follow-up instructions:  I discussed the assessment and treatment plan with the patient.  The patient was provided an opportunity to ask questions and all were answered.  The patient agreed with the plan and demonstrated understanding of instructions.  The patient was advised to call back or seek an in person evaluation if the symptoms worsen or if the condition fails to improve as anticipated.    Dr. Hedda Liv [facility] 11/20/2023 10:35 AM

## 2023-11-20 NOTE — Addendum Note (Signed)
 Addended by: Dominique Frieze A on: 11/20/2023 11:19 AM   Modules accepted: Orders

## 2023-11-20 NOTE — Assessment & Plan Note (Addendum)
#   LLL- Lung cancer-non-small cell stage IV [right suprarenal nodule-s/p biopsy non-small cell ca].  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  Last keytruda  was July, 27th, 2023.   #  CAP CT scan JUNE 2025-  No acute intrathoracic, abdominal, or pelvic pathology. No evidence of recurrent or metastatic disease;  Indeterminate 15 mm right renal upper pole exophytic lesion- see below.    # Discontinue further therapy- given overall stable disease/poor tolerance to therapy-. Continue surveillance imaging in 6  months.     #  incidental CT [place]  2024- 1.5. Hyperattenuating exophytic lesion of the upper pole of the right kidney is new when compared with Oct 08, 2020 prior and concerning for a small RCC. Patient/daughter declined.JUNE 2025- stable- monitor for now.   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  Likely contributing to patient's cognitive deficits- [Dr.vaslow] -overall stable as per daughter.  # CAD-s/p CABG-/PVD-s/p stent [Dr.Schneir]-improvement in pain noted.  On Plavix  -stable.  # IV access: Mediport functioning-discussed regarding port explantation as patient is not a candidate for any further treatment even if patient has recurrent disease.  Daughter symptomatic event however she discussed with family and will confirm.   #Incidental findings on Imaging  CT , DEC  2024: Aortic Atherosclerosis ; Emphysema   I reviewed/discussed/counseled the patient.   I spoke at length with the patient's  daughter-  regarding the patient's clinical status/plan of care.  Family agreement.   # DISPOSITION: # refer to IR re: port explanation [pt not decided yet; so keep port flushes for now] # port flush in 3 months # follow up in 6 months with MD; port/ labs-cbc/cmp;TSH- No chemo; CT CAP-- Dr.B  # I  reviewed the blood work- with the patient in detail; also reviewed the imaging independently [as summarized above]; and with the patient in detail.

## 2023-11-25 ENCOUNTER — Ambulatory Visit: Payer: PPO

## 2023-11-25 VITALS — Ht 68.0 in | Wt 214.0 lb

## 2023-11-25 DIAGNOSIS — Z0000 Encounter for general adult medical examination without abnormal findings: Secondary | ICD-10-CM | POA: Diagnosis not present

## 2023-11-25 DIAGNOSIS — Z7189 Other specified counseling: Secondary | ICD-10-CM | POA: Diagnosis not present

## 2023-11-25 NOTE — Patient Instructions (Signed)
 [name] , Thank you for taking time out of your busy schedule to complete your Annual Wellness Visit with me. I enjoyed our conversation and look forward to speaking with you again next year. I, as well as your care team,  appreciate your ongoing commitment to your health goals. Please review the following plan we discussed and let me know if I can assist you in the future. Your Game plan/ To Do List    Follow up Visits: Next Medicare AWV with our clinical staff: 11/28/2024 @ 3:40pm   Have you seen your provider in the last 6 months (3 months if uncontrolled diabetes)? No Next Office Visit with your provider: none:pt declined to schedule.  Clinician Recommendations:  Aim for 30 minutes of exercise or brisk walking, 6-8 glasses of water , and 5 servings of fruits and vegetables each day.       This is a list of the screening recommended for you and due dates:  Health Maintenance  Topic Date Due   COVID-19 Vaccine (3 - Pfizer risk series) 08/18/2019   Flu Shot  01/08/2024   Medicare Annual Wellness Visit  11/24/2024   DTaP/Tdap/Td vaccine (2 - Td or Tdap) 04/17/2025   Pneumococcal Vaccine for age over 47  Completed   Zoster (Shingles) Vaccine  Completed   HPV Vaccine  Aged Out   Meningitis B Vaccine  Aged Out   Hepatitis C Screening  Discontinued    Advanced directives: (Copy Requested) Please bring a copy of your health care power of attorney and living will to the office to be added to your chart at your convenience. You can mail to [facility] [address] [place], [place] [address] or email to [email or web address] Advance Care Planning is important because it:  [x]  Makes sure you receive the medical care that is consistent with your values, goals, and preferences  [x]  It provides guidance to your family and loved ones and reduces their decisional burden about whether or not they are making the right decisions based on your wishes.  Follow the link  provided in your after visit summary or read over the paperwork we have mailed to you to help you started getting your Advance Directives in place. If you need assistance in completing these, please reach out to us  so that we can help you!

## 2023-11-25 NOTE — Progress Notes (Signed)
 Subjective:   Randall Ali. is a 83 y.o. who presents for a Medicare Wellness preventive visit.  As a reminder, Annual Wellness Visits don't include a physical exam, and some assessments may be limited, especially if this visit is performed virtually. We may recommend an in-person follow-up visit with your provider if needed.  Visit Complete: Virtual I connected with  [employer]. on 11/25/23 by a audio enabled telemedicine application and verified that I am speaking with the correct person using two identifiers.  Patient Location: Home  Provider Location: Office/Clinic  I discussed the limitations of evaluation and management by telemedicine. The patient expressed understanding and agreed to proceed.  Vital Signs: Because this visit was a virtual/telehealth visit, some criteria may be missing or patient reported. Any vitals not documented were not able to be obtained and vitals that have been documented are patient reported.  VideoDeclined- This patient declined Librarian, academic. Therefore the visit was completed with audio only.  Persons Participating in Visit: Patient.  AWV Questionnaire: Yes: Patient Medicare AWV questionnaire was completed by the patient on 11/22/23; I have confirmed that all information answered by patient is correct and no changes since this date.  Cardiac Risk Factors include: advanced age (>74men, >72 women);hypertension;dyslipidemia;male gender;sedentary lifestyle;obesity (BMI >30kg/m2)     Objective:    Today's Vitals   11/22/23 1756 11/25/23 1541  Weight:  214 lb (97.1 kg)  Height:  5' 8 (1.727 m)  PainSc: 0-No pain    Body mass index is 32.54 kg/m.     11/20/2023    9:45 AM 05/20/2023    8:55 AM 11/24/2022    3:03 PM 11/19/2022   10:04 AM 10/31/2022    8:54 AM 07/18/2022   10:26 AM 05/09/2022    9:43 AM  Advanced Directives  Does Patient Have a Medical Advance Directive? Yes Yes Yes Yes Yes Yes Yes   Type of Estate agent of [place];Living will Healthcare Power of [place];Living will Healthcare Power of [place];Living will Healthcare Power of [place];Living will Living will;Healthcare Power of [employer] Power of [place];Living will Healthcare Power of [place];Living will  Does patient want to make changes to medical advance directive?     Yes (ED - Information included in AVS)  No - Patient declined  Copy of Healthcare Power of Attorney in Chart? No - copy requested  No - copy requested    No - copy requested  Would patient like information on creating a medical advance directive?     Yes (ED - Information included in AVS)  No - Patient declined    Current Medications (verified) Outpatient Encounter Medications as of 11/25/2023  Medication Sig   acetaminophen  (TYLENOL ) 325 MG tablet Take 650 mg by mouth as needed for moderate pain or mild pain.   albuterol  (VENTOLIN  HFA) 108 (90 Base) MCG/ACT inhaler Inhale 2 puffs into the lungs every 6 (six) hours as needed for wheezing or shortness of breath.   Ascorbic Acid  (VITAMIN C ) 1000 MG tablet Take 1 tablet (1,000 mg total) by mouth daily.   aspirin  81 MG EC tablet Take 81 mg by mouth daily.     Calcium -Magnesium -Vitamin D  (CALCIUM  1200+D3 PO) Take 1 tablet by mouth daily.   Carboxymethylcellul-Glycerin (LUBRICATING EYE DROPS OP) Place 1 drop into both eyes daily as needed (irritation).   cetirizine (ZYRTEC) 10 MG chewable tablet Chew 10 mg by mouth daily.   Cholecalciferol  (VITAMIN D3) 50 MCG (2000 UT) TABS Take  2,000 Units by mouth daily.   Cyanocobalamin  (B-12) 5000 MCG CAPS Take 5,000 mcg by mouth daily.   docusate sodium  (COLACE) 100 MG capsule Take 200 mg by mouth at bedtime.   ezetimibe  (ZETIA ) 10 MG tablet Take 1 tablet (10 mg total) by mouth daily.   finasteride  (PROSCAR ) 5 MG tablet TAKE 1 TABLET BY MOUTH DAILY   fluticasone  (FLONASE ) 50 MCG/ACT nasal spray Place 2 sprays into both nostrils daily.    lidocaine -prilocaine  (EMLA ) cream Apply 30 -45 mins prior to port access.   Melatonin 10 MG CAPS Take 10 mg by mouth at bedtime as needed (sleep).   nitroGLYCERIN  (NITROSTAT ) 0.4 MG SL tablet Place 1 tablet (0.4 mg total) under the tongue every 5 (five) minutes as needed for chest pain.   pantoprazole  (PROTONIX ) 40 MG tablet Take 1 tablet (40 mg total) by mouth daily.   polyethylene glycol powder (GLYCOLAX /MIRALAX ) 17 GM/SCOOP powder Take 8.5-17 g by mouth daily as needed for moderate constipation.   prochlorperazine  (COMPAZINE ) 10 MG tablet Take 1 tablet (10 mg total) by mouth every 6 (six) hours as needed for nausea or vomiting.   valsartan  (DIOVAN ) 80 MG tablet Take 1 tablet (80 mg total) by mouth daily.   Vitamin E  450 MG (1000 UT) CAPS Take 450 Units by mouth daily.   Zinc  50 MG TABS Take 50 mg by mouth daily.   benzonatate  (TESSALON ) 200 MG capsule Take 1 capsule (200 mg total) by mouth 3 (three) times daily as needed for cough. (Patient not taking: Reported on 11/25/2023)   guaiFENesin (MUCINEX) 600 MG 12 hr tablet Take by mouth 2 (two) times daily. (Patient not taking: Reported on 11/25/2023)   simvastatin  (ZOCOR ) 40 MG tablet Take 1 tablet (40 mg total) by mouth at bedtime.   Facility-Administered Encounter Medications as of 11/25/2023  Medication   heparin  lock flush 100 UNIT/ML injection    Allergies (verified) Patient has no known allergies.   History: Past Medical History:  Diagnosis Date   Allergy    seasonal   Arthritis    all over- in general    CAD (coronary artery disease)    a. inferior wall MI 10/01 s/p PCI/DES to RCA; b. Myoview 3/16 neg for ischemia; c. LHC 8/16: ostLAD 80%, OM1 70%, OM2 70% x 2 lesions, mRCA 30%, dRCA 70% s/p 4-V CABG 01/24/15 (LIMA-LAD, VG- OM1, VG-OM2, VG-PDA)    Cancer (HCC)    skin, melanoma   Carotid artery disease (HCC)    a. US  8/16: 1-39% bilateral ICA stenosis   Cataract    removed   Diastolic dysfunction    a. 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   stent RCA   Lung cancer (HCC)    Postoperative wound infection 02/02/2015   Reflux esophagitis    Sleep apnea 2017   CPAP at night   Past Surgical History:  Procedure Laterality Date   arm surgery  2010   BROW LIFT Bilateral 11/25/2019   Procedure: BROW PTOSIS REPAIR BILATERAL;  Surgeon: Zacarias Hermann, MD;  Location: [facility];  Service: Ophthalmology;  Laterality: Bilateral;  sleep apnea   CARDIAC CATHETERIZATION  06/24/2011   CARDIAC CATHETERIZATION N/A 01/18/2015  Procedure: Left Heart Cath with coronary angiography;  Surgeon: Devorah Fonder, MD;  Location: [facility] INV[facility];  Service: Cardiovascular;  Laterality: N/A;   CARDIAC CATHETERIZATION N/A 01/18/2015   Procedure: Intravascular Pressure Wire/FFR Study;  Surgeon: Wenona Hamilton, MD;  Location: [facility] INV[facility] CV LAB;  Service: Cardiovascular;  Laterality: N/A;   CAROTID STENT  03/10/2011   COLONOSCOPY  2010   COLONOSCOPY  06/14/2014   Dr Bridgett Camps   COLONOSCOPY  01/2022   done for colon thickening at splenic flexure - 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[name])   CORONARY ARTERY BYPASS GRAFT N/A 01/24/2015   Procedure: CORONARY ARTERY BYPASS GRAFTING x 4 (LIMA-LAD, SVG-Int 1- Int 2, SVG-PD) ENDOSCOPIC GREATER SAPHENOUS VEIN HARVEST LEFT LEG;  Surgeon: Norita Beauvais, MD;  Location: [facility] OR;  Service: Open Heart Surgery;  Laterality: N/A;   EMBOLECTOMY  06/15/2019   Procedure: EMBOLECTOMY;  Surgeon: Jackquelyn Mass, MD;  Location: [facility] ORS;  Service: Vascular;;  right superficial femoral artery   ENDARTERECTOMY FEMORAL Right 06/15/2019   Procedure: ENDARTERECTOMY FEMORAL;  Surgeon: Jackquelyn Mass, MD;  Location: [facility]  ORS;  Service: Vascular;  Laterality: Right;  common femoral profunda femoris superficial femoral   ESOPHAGOGASTRODUODENOSCOPY (EGD) WITH PROPOFOL  N/A 04/24/2016   Procedure: ESOPHAGOGASTRODUODENOSCOPY (EGD) WITH PROPOFOL ;  Surgeon: Nannette Babe, MD;  Location: [facility];  Service: Gastroenterology;  Laterality: N/A;   EYE SURGERY     lasik 15 yrs. ago, cataracts removed - both eyes    HAMMER TOE SURGERY     right toe   INSERTION OF ILIAC STENT Right 06/15/2019   Procedure: INSERTION OF ILIAC STENT ( STENTING OF SFA/POP ARTERY );  Surgeon: Jackquelyn Mass, MD;  Location: [facility] ORS;  Service: Vascular;  Laterality: Right;  angioplpasty and stent placement: right superficial femoral right tibiopopliteal trunk bilateral common iliac arteries   IR IMAGING GUIDED PORT INSERTION  11/09/2020   LEFT HEART CATH AND CORONARY ANGIOGRAPHY Left 06/10/2017   Procedure: LEFT HEART CATH AND CORONARY ANGIOGRAPHY;  Surgeon: Devorah Fonder, MD;  Location: [facility];  Service: Cardiovascular;  Laterality: Left;   LOWER EXTREMITY ANGIOGRAPHY Left 01/04/2019   Procedure: LOWER EXTREMITY ANGIOGRAPHY;  Surgeon: Jackquelyn Mass, MD;  Location: [facility];  Service: Cardiovascular;  Laterality: Left;   LOWER EXTREMITY ANGIOGRAPHY Right 01/25/2019   Procedure: LOWER EXTREMITY ANGIOGRAPHY;  Surgeon: Jackquelyn Mass, MD;  Location: [facility];  Service: Cardiovascular;  Laterality: Right;   LOWER EXTREMITY ANGIOGRAPHY Right 01/15/2021   LOWER EXTREMITY ANGIOGRAPHY and stent placement to R SFA and popliteal artery Prescilla Brod, Ninette Basque, MD)   NASAL SINUS SURGERY  2008   septpolasty, bilateral turbinate reduction   SHOULDER ARTHROSCOPY  2012   TEE WITHOUT CARDIOVERSION N/A 01/24/2015   Procedure: TRANSESOPHAGEAL ECHOCARDIOGRAM (TEE);  Surgeon: Norita Beauvais, MD;  Location: [facility];  Service: Open Heart Surgery;  Laterality: N/A;   TOE SURGERY  1994   UPPER GI ENDOSCOPY   07/2014, 04-24-16   Dr Alda Amas   WRIST SURGERY  2011   Family History  Problem Relation Age of Onset   Hypertension Mother    Heart disease Mother    Hypertension Father    Diabetes Father    Lymphoma Sister    Heart disease Brother 19   Cancer Paternal Grandfather    Colon cancer Neg Hx    Prostate cancer Neg Hx    Bladder Cancer Neg Hx    Kidney cancer  Neg Hx    Colon polyps Neg Hx    Esophageal cancer Neg Hx    Pancreatic cancer Neg Hx    Stomach cancer Neg Hx    Social History   Socioeconomic History   Marital status: Single    Spouse name: Not on file   Number of children: Not on file   Years of education: 12   Highest education level: 12th grade  Occupational History   Occupation: retired    Comment: Pensions consultant  Tobacco Use   Smoking status: Former    Current packs/day: 0.00    Average packs/day: 2.5 packs/day for 40.0 years (100.0 ttl pk-yrs)    Types: Cigarettes    Start date: 03/24/1960    Quit date: 03/24/2000    Years since quitting: 23.6   Smokeless tobacco: Never  Vaping Use   Vaping status: Never Used  Substance and Sexual Activity   Alcohol  use: Yes    Comment: occassionally   Drug use: No   Sexual activity: Yes  Other Topics Concern   Not on file  Social History Narrative   Singled; lives with son and dog    Occ: retired, back part time at [employer];    Activity: gym 4-5d/wk   Diet: good water , fruits/vegetables daily   Caffeine Use-yes      H/o [employer] service   ------------------------------------       Retail banker- retd; [employer] store- retd; quit smoking 2001. Alcohol  couple nights a week. Live in [place]. Daughter lives 10 mins.    Social Drivers of Corporate investment banker Strain: Low Risk  (11/22/2023)   Overall Financial Resource Strain (CARDIA)    Difficulty of Paying Living Expenses: Not hard at all  Food Insecurity: No Food Insecurity (11/22/2023)   Hunger Vital Sign    Worried About Running Out of Food in the Last Year:  Never true    Ran Out of Food in the Last Year: Never true  Transportation Needs: No Transportation Needs (11/22/2023)   PRAPARE - Administrator, Civil Service (Medical): No    Lack of Transportation (Non-Medical): No  Physical Activity: Insufficiently Active (11/22/2023)   Exercise Vital Sign    Days of Exercise per Week: 2 days    Minutes of Exercise per Session: 10 min  Stress: Stress Concern Present (11/22/2023)   [employer] of Occupational Health - Occupational Stress Questionnaire    Feeling of Stress: To some extent  Social Connections: Moderately Isolated (11/22/2023)   Social Connection and Isolation Panel    Frequency of Communication with Friends and Family: Twice a week    Frequency of Social Gatherings with Friends and Family: Twice a week    Attends Religious Services: Never    Database administrator or Organizations: Yes    Attends Engineer, structural: 1 to 4 times per year    Marital Status: Divorced    Tobacco Counseling Counseling given: Not Answered  Clinical Intake:  Pre-visit preparation completed: Yes  Pain Score: 0-No pain    BMI - recorded: 32.54 Nutritional Status: BMI > 30  Obese Nutritional Risks: None Diabetes: No  Lab Results  Component Value Date   HGBA1C 5.8 (H) 02/11/2022   HGBA1C 5.6 08/24/2017   HGBA1C 5.8 (H) 01/22/2015     How often do you need to have someone help you when you read instructions, pamphlets, or other written materials from your doctor or pharmacy?: 1 - Never  Interpreter Needed?:  No  Comments: lives with daughter Information entered by :: B.Wrigley Plasencia,LPN   Activities of Daily Living     11/22/2023    5:56 PM  In your present state of health, do you have any difficulty performing the following activities:  Hearing? 0  Vision? 0  Difficulty concentrating or making decisions? 1  Walking or climbing stairs? 1  Dressing or bathing? 0  Doing errands, shopping? 1  Preparing Food and  eating ? Y  Using the Toilet? N  In the past six months, have you accidently leaked urine? N  Do you have problems with loss of bowel control? N  Managing your Medications? Y  Managing your Finances? Y  Housekeeping or managing your Housekeeping? Y    Patient Care Team: Claire Crick, MD as PCP - General (Family Medicine) Jerelene Monday Deadra Everts, MD as PCP - Cardiology (Cardiology) Thersia Flax, MD (Internal Medicine) Devorah Fonder, MD as Consulting Physician (Cardiology) Merriam Abbey, DO as Consulting Physician (Neurology) Gwyn Leos, MD as Consulting Physician (Hematology and Oncology) Drake Gens, RN as Oncology Nurse Navigator  I have updated your Care Teams any recent Medical Services you may have received from other providers in the past year.     Assessment:   This is a routine wellness examination for [place].  Hearing/Vision screen Hearing Screening - Comments:: Pt says he hears well with hearing aids Vision Screening - Comments:: Pt says his vision is good with glasses-does not know eye dr name   Goals Addressed             This Visit's Progress    Patient Stated       11/25/23-No goals       Depression Screen     11/25/2023    3:47 PM 11/24/2022    3:03 PM 11/05/2022    9:32 AM 09/26/2021    9:40 AM 03/21/2020    3:38 PM 01/24/2019   12:24 PM 08/24/2017    2:00 PM  PHQ 2/9 Scores  PHQ - 2 Score 0 0 0 0 0 0 0  PHQ- 9 Score  0 1  0 0 0    Fall Risk     11/22/2023    5:56 PM 11/24/2022    2:55 PM 11/20/2022    8:08 PM 11/05/2022    9:32 AM 09/26/2021    9:34 AM  Fall Risk   Falls in the past year? 0 0 0 0 0  Number falls in past yr: 0 0   0  Injury with Fall? 0 0 0  0  Risk for fall due to : No Fall Risks No Fall Risks     Follow up Falls prevention discussed;Education provided Falls prevention discussed;Falls evaluation completed   Falls evaluation completed;Falls prevention discussed;Education provided      Data saved with a  previous flowsheet row definition    MEDICARE RISK AT HOME:  Medicare Risk at Home Any stairs in or around the home?: (Patient-Rptd) No If so, are there any without handrails?: (Patient-Rptd) No Home free of loose throw rugs in walkways, pet beds, electrical cords, etc?: (Patient-Rptd) Yes Adequate lighting in your home to reduce risk of falls?: (Patient-Rptd) Yes Life alert?: (Patient-Rptd) No Use of a cane, walker or w/c?: (Patient-Rptd) No Grab bars in the bathroom?: (Patient-Rptd) No Shower chair or bench in shower?: (Patient-Rptd) Yes Elevated toilet seat or a handicapped toilet?: (Patient-Rptd) No  TIMED UP AND GO:  Was the test performed?  No  Cognitive Function: 6CIT completed    03/21/2020    3:40 PM 01/24/2019   12:27 PM 08/24/2017    2:13 PM 07/14/2016   10:14 AM  MMSE - Mini Mental State Exam  Orientation to time 5 5 5 5    Orientation to Place 5 5 5 5    Registration 3 3 3 3    Attention/ Calculation 3 2 0 0   Attention/Calculation-comments  did not get r,o,w    Recall 2 3 2 2    Recall-comments   unable to recall 1 of 3 words pt was unable to recall 1 of 3 words   Language- name 2 objects  0 0 0   Language- repeat 1 1 1 1   Language- follow 3 step command  0 3 3   Language- read & follow direction  0 0 0   Write a sentence  0 0 0   Copy design  0 0 0   Total score  19 19 19       Data saved with a previous flowsheet row definition        11/25/2023    3:49 PM 11/24/2022    3:05 PM 09/26/2021    9:36 AM  6CIT Screen  What Year? 4 points 0 points 0 points  What month? 3 points 0 points 0 points  What time? 0 points 0 points 0 points  Count back from 20 0 points 4 points 0 points  Months in reverse 4 points 4 points 4 points  Repeat phrase 10 points 10 points 8 points  Total Score 21 points 18 points 12 points    Immunizations Immunization History  Administered Date(s) Administered   Fluad Quad(high Dose 65+) 03/17/2019, 03/23/2020, 04/08/2022   Fluad  Trivalent(High Dose 65+) 04/10/2023   Influenza Split 02/27/2012, 04/10/2014   Influenza, High Dose Seasonal PF 04/05/2013, 02/08/2016, 06/06/2021   Influenza,inj,Quad PF,6+ Mos 03/22/2015, 02/25/2017, 03/11/2018   PFIZER(Purple Top)SARS-COV-2 Vaccination 06/30/2019, 07/21/2019   Pneumococcal Conjugate-13 04/13/2015   Pneumococcal Polysaccharide-23 02/26/2005, 02/27/2012   Tdap 04/18/2015   Zoster Recombinant(Shingrix) 09/23/2017, 02/12/2018   Zoster, Live 04/06/2011    Screening Tests Health Maintenance  Topic Date Due   COVID-19 Vaccine (3 - Pfizer risk series) 08/18/2019   INFLUENZA VACCINE  01/08/2024   Medicare Annual Wellness (AWV)  11/24/2024   DTaP/Tdap/Td (2 - Td or Tdap) 04/17/2025   Pneumococcal Vaccine: 50+ Years  Completed   Zoster Vaccines- Shingrix  Completed   HPV VACCINES  Aged Out   Meningococcal B Vaccine  Aged Out   Hepatitis C Screening  Discontinued    Health Maintenance  Health Maintenance Due  Topic Date Due   COVID-19 Vaccine (3 - Pfizer risk series) 08/18/2019   Health Maintenance Items Addressed:   Additional Screening:  Vision Screening: Recommended annual ophthalmology exams for early detection of glaucoma and other disorders of the eye. Would you like a referral to an eye doctor? No    Dental Screening: Recommended annual dental exams for proper oral hygiene  Community Resource Referral / Chronic Care Management: CRR required this visit?  No   CCM required this visit?  No pt declined to make appt with PCP.Aaron Aassays will have daughter call   Plan:    I have personally reviewed and noted the following in the patient's chart:   Medical and social history Use of alcohol , tobacco or illicit drugs  Current medications and supplements including opioid prescriptions. Patient is not currently taking opioid prescriptions. Functional ability and status Nutritional status  Physical activity Advanced directives List of other  physicians Hospitalizations, surgeries, and ER visits in previous 12 months Vitals Screenings to include cognitive, depression, and falls Referrals and appointments  In addition, I have reviewed and discussed with patient certain preventive protocols, quality metrics, and best practice recommendations. A written personalized care plan for preventive services as well as general preventive health recommendations were provided to patient.   Nerissa Bannister, LPN   [id number]/[id number]   After Visit Summary: (MyChart) Due to this being a telephonic visit, the after visit summary with patients personalized plan was offered to patient via MyChart   Notes: Nothing significant to report at this time.

## 2023-11-30 ENCOUNTER — Other Ambulatory Visit: Payer: Self-pay | Admitting: Family Medicine

## 2023-11-30 DIAGNOSIS — N401 Enlarged prostate with lower urinary tract symptoms: Secondary | ICD-10-CM

## 2023-12-01 ENCOUNTER — Telehealth: Payer: Self-pay

## 2023-12-01 NOTE — Telephone Encounter (Signed)
 E-scribed refill  Plz schedule CPE and fasting labs (no food/drink- except water and/or blk coffee 5 hrs prior) for additional refills.

## 2023-12-01 NOTE — Progress Notes (Signed)
 Complex Care Management Note  Care Guide Note 12/01/2023 Name: Randall Ali. MRN: [id number] DOB: 04-Mar-1941  Randall Ali. is a 83 y.o. year old male who sees Rilla Baller, MD for primary care. I reached out to [employer]. by phone today to offer complex care management services.  Randall Ali was given information about Complex Care Management services today including:   The Complex Care Management services include support from the care team which includes your Nurse Care Manager, Clinical Social Worker, or Pharmacist.  The Complex Care Management team is here to help remove barriers to the health concerns and goals most important to you. Complex Care Management services are voluntary, and the patient may decline or stop services at any time by request to their care team member.   Complex Care Management Consent Status: Patient did not agree to participate in complex care management services at this time.  Encounter Outcome:  Patient Refused  Dreama Agent [facility], [facility] Care Management Assistant Direct Dial: [phone]  Fax: [phone]

## 2023-12-04 ENCOUNTER — Telehealth: Payer: Self-pay | Admitting: *Deleted

## 2023-12-04 NOTE — Telephone Encounter (Signed)
 Voice mail - Msg from daughter left on triage. Daughter stated that patient can not come to port removal apt on 7/1. She had received a msg that the apt could be on 7/1. I attempted to reach Randall Ali in IR scheduling, Randall is not available to communicate this message.  I returned daughter's call and left her a vm to let her know that it would be Monday before we can call back to get a different date for her.

## 2023-12-07 ENCOUNTER — Encounter: Payer: Self-pay | Admitting: Internal Medicine

## 2023-12-08 ENCOUNTER — Telehealth: Payer: Self-pay | Admitting: *Deleted

## 2023-12-08 NOTE — Telephone Encounter (Signed)
 Pt daughter was offered a couple of appt times for patient to have his port removed. I gave her IR scheduling phone number as she thinks she wants to wait until Aug to have port removed. The port will be removed with local.

## 2023-12-08 NOTE — Telephone Encounter (Signed)
 Randall Ali spoke with family, Family in and can't do it. I just gave her your phone number. She thinks she is going to wait until August..... SABRA

## 2023-12-08 NOTE — Telephone Encounter (Signed)
 Randall Ali/Randall Ali checking on this.

## 2023-12-09 ENCOUNTER — Ambulatory Visit (INDEPENDENT_AMBULATORY_CARE_PROVIDER_SITE_OTHER): Admitting: Family Medicine

## 2023-12-09 ENCOUNTER — Encounter: Payer: Self-pay | Admitting: Family Medicine

## 2023-12-09 VITALS — BP 120/62 | HR 81 | Temp 97.7°F | Ht 68.0 in | Wt 217.5 lb

## 2023-12-09 DIAGNOSIS — R4189 Other symptoms and signs involving cognitive functions and awareness: Secondary | ICD-10-CM | POA: Diagnosis not present

## 2023-12-09 DIAGNOSIS — G44229 Chronic tension-type headache, not intractable: Secondary | ICD-10-CM | POA: Diagnosis not present

## 2023-12-09 DIAGNOSIS — C3492 Malignant neoplasm of unspecified part of left bronchus or lung: Secondary | ICD-10-CM

## 2023-12-09 DIAGNOSIS — R454 Irritability and anger: Secondary | ICD-10-CM

## 2023-12-09 DIAGNOSIS — E782 Mixed hyperlipidemia: Secondary | ICD-10-CM

## 2023-12-09 MED ORDER — DULOXETINE HCL 20 MG PO CPEP: 20.0000 mg | ORAL_CAPSULE | Freq: Every day | ORAL | 6 refills | Status: DC

## 2023-12-09 NOTE — Progress Notes (Unsigned)
 Ph: [phone] Fax: [phone]   Patient ID: Randall Ali., male    DOB: 1940/07/01, 83 y.o.   MRN: [id number]  This visit was conducted in person.  BP 120/62   Pulse 81   Temp 97.7 F (36.5 C) (Oral)   Ht 5' 8 (1.727 m)   Wt 217 lb 8 oz (98.7 kg)   SpO2 94%   BMI 33.07 kg/m    CC: memory f/u visit  Subjective:   HPI: Randall Starlin. is a 83 y.o. male presenting on 12/09/2023 for Medical Management of Chronic Issues (Here for demetia f/u and discuss recent aggressive behavior. Pt accompanied by daughter, Randall Ali. )   Last seen in office 10/2022.  Has regularly seen oncology for known LLL lung cancer non-small cell stage IV.  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 Has previously seen Dr Buckley for this.   OSA continues CPAP  Good appetite Low energy Sleeping well - 9 hours       Relevant past medical, surgical, family and social history reviewed and updated as indicated. Interim medical history since our last visit reviewed. Allergies and medications reviewed and updated. Outpatient Medications Prior to Visit  Medication Sig Dispense Refill   acetaminophen  (TYLENOL ) 325 MG tablet Take 650 mg by mouth as needed for moderate pain or mild pain.     albuterol  (VENTOLIN  HFA) 108 (90 Base) MCG/ACT inhaler Inhale 2 puffs into the lungs every 6 (six) hours as needed for wheezing or shortness of breath. 18 g 2   Ascorbic Acid  (VITAMIN C ) 1000 MG tablet Take 1 tablet (1,000 mg total) by mouth daily.     aspirin  81 MG EC tablet Take 81 mg by mouth daily.       cetirizine (ZYRTEC) 10 MG chewable tablet Chew 10 mg by mouth daily.     Cyanocobalamin  (B-12) 5000 MCG CAPS Take 5,000 mcg by mouth daily.     ezetimibe  (ZETIA ) 10 MG tablet Take 1 tablet (10 mg total) by mouth daily. 90 tablet 3   finasteride  (PROSCAR ) 5 MG tablet TAKE 1 TABLET BY MOUTH DAILY 90 tablet 0   Melatonin 10 MG CAPS Take 10 mg by mouth at bedtime as needed (sleep).     nitroGLYCERIN   (NITROSTAT ) 0.4 MG SL tablet Place 1 tablet (0.4 mg total) under the tongue every 5 (five) minutes as needed for chest pain. 25 tablet 3   valsartan  (DIOVAN ) 80 MG tablet Take 1 tablet (80 mg total) by mouth daily. 90 tablet 3   Vitamin E  450 MG (1000 UT) CAPS Take 450 Units by mouth daily.     Zinc  50 MG TABS Take 50 mg by mouth daily.     Cholecalciferol  (VITAMIN D3) 50 MCG (2000 UT) TABS Take 2,000 Units by mouth daily. 30 tablet    docusate sodium  (COLACE) 100 MG capsule Take 200 mg by mouth at bedtime.     fluticasone  (FLONASE ) 50 MCG/ACT nasal spray Place 2 sprays into both nostrils daily.     guaiFENesin (MUCINEX) 600 MG 12 hr tablet Take by mouth 2 (two) times daily. (Patient not taking: Reported on 11/25/2023)     lidocaine -prilocaine  (EMLA ) cream Apply 30 -45 mins prior to port access. 30 g 3   polyethylene glycol powder (GLYCOLAX /MIRALAX ) 17 GM/SCOOP powder Take 8.5-17 g by mouth daily as needed for moderate constipation. 510 g 1   simvastatin  (ZOCOR ) 40 MG tablet Take 1 tablet (40 mg total) by mouth at bedtime. (Patient  not taking: Reported on 12/09/2023) 90 tablet 3   benzonatate  (TESSALON ) 200 MG capsule Take 1 capsule (200 mg total) by mouth 3 (three) times daily as needed for cough. (Patient not taking: Reported on 11/25/2023) 60 capsule 0   Calcium -Magnesium -Vitamin D  (CALCIUM  1200+D3 PO) Take 1 tablet by mouth daily.     Carboxymethylcellul-Glycerin (LUBRICATING EYE DROPS OP) Place 1 drop into both eyes daily as needed (irritation).     pantoprazole  (PROTONIX ) 40 MG tablet Take 1 tablet (40 mg total) by mouth daily. (Patient not taking: Reported on 12/09/2023) 90 tablet 1   prochlorperazine  (COMPAZINE ) 10 MG tablet Take 1 tablet (10 mg total) by mouth every 6 (six) hours as needed for nausea or vomiting. (Patient not taking: Reported on 12/09/2023) 40 tablet 1   Facility-Administered Medications Prior to Visit  Medication Dose Route Frequency Provider Last Rate Last Admin   heparin  lock  flush 100 UNIT/ML injection              Per HPI unless specifically indicated in ROS section below Review of Systems  Objective:  BP 120/62   Pulse 81   Temp 97.7 F (36.5 C) (Oral)   Ht 5' 8 (1.727 m)   Wt 217 lb 8 oz (98.7 kg)   SpO2 94%   BMI 33.07 kg/m   Wt Readings from Last 3 Encounters:  12/09/23 217 lb 8 oz (98.7 kg)  11/25/23 214 lb (97.1 kg)  11/20/23 214 lb 12.8 oz (97.4 kg)      Physical Exam Vitals and nursing note reviewed.  Constitutional:      Appearance: Normal appearance. He is not ill-appearing.  HENT:     Head: Normocephalic and atraumatic.     Mouth/Throat:     Mouth: Mucous membranes are moist.     Pharynx: Oropharynx is clear. No oropharyngeal exudate or posterior oropharyngeal erythema.  Eyes:     Extraocular Movements: Extraocular movements intact.     Conjunctiva/sclera: Conjunctivae normal.     Pupils: Pupils are equal, round, and reactive to light.  Neck:     Thyroid : No thyroid  mass or thyromegaly.  Cardiovascular:     Rate and Rhythm: Normal rate and regular rhythm.     Pulses: Normal pulses.     Heart sounds: Normal heart sounds. No murmur heard. Pulmonary:     Effort: Pulmonary effort is normal. No respiratory distress.     Breath sounds: Normal breath sounds. No wheezing, rhonchi or rales.  Musculoskeletal:     Cervical back: Normal range of motion and neck supple.     Right lower leg: No edema.     Left lower leg: No edema.  Skin:    General: Skin is warm and dry.     Findings: No rash.  Neurological:     General: No focal deficit present.     Mental Status: He is alert.     Comments:  CN 2-12 intact FTN intact EOMI Negative romberg  Psychiatric:        Mood and Affect: Mood normal.        Speech: Speech normal.        Behavior: Behavior normal.     Comments: Irritable due to wait to be seen today        Results for orders placed or performed in visit on 11/20/23  TSH   Collection Time: 11/20/23  9:40 AM  Result  Value Ref Range   TSH 4.232 0.350 - 4.500 uIU/mL  CBC with  Differential (Cancer Center Only)   Collection Time: 11/20/23  9:40 AM  Result Value Ref Range   WBC Count 7.4 4.0 - 10.5 K/uL   RBC 4.86 4.22 - 5.81 MIL/uL   Hemoglobin 15.1 13.0 - 17.0 g/dL   HCT 55.9 60.9 - 47.9 %   MCV 90.5 80.0 - 100.0 fL   MCH 31.1 26.0 - 34.0 pg   MCHC 34.3 30.0 - 36.0 g/dL   RDW 87.1 88.4 - 84.4 %   Platelet Count 217 150 - 400 K/uL   nRBC 0.0 0.0 - 0.2 %   Neutrophils Relative % 68 %   Neutro Abs 5.1 1.7 - 7.7 K/uL   Lymphocytes Relative 19 %   Lymphs Abs 1.4 0.7 - 4.0 K/uL   Monocytes Relative 9 %   Monocytes Absolute 0.6 0.1 - 1.0 K/uL   Eosinophils Relative 2 %   Eosinophils Absolute 0.1 0.0 - 0.5 K/uL   Basophils Relative 1 %   Basophils Absolute 0.1 0.0 - 0.1 K/uL   Immature Granulocytes 1 %   Abs Immature Granulocytes 0.07 0.00 - 0.07 K/uL  CMP (Cancer Center only)   Collection Time: 11/20/23  9:40 AM  Result Value Ref Range   Sodium 138 135 - 145 mmol/L   Potassium 4.0 3.5 - 5.1 mmol/L   Chloride 107 98 - 111 mmol/L   CO2 22 22 - 32 mmol/L   Glucose, Bld 103 (H) 70 - 99 mg/dL   BUN 16 8 - 23 mg/dL   Creatinine 8.87 9.38 - 1.24 mg/dL   Calcium  9.1 8.9 - 10.3 mg/dL   Total Protein 7.0 6.5 - 8.1 g/dL   Albumin  4.2 3.5 - 5.0 g/dL   AST 17 15 - 41 U/L   ALT 16 0 - 44 U/L   Alkaline Phosphatase 60 38 - 126 U/L   Total Bilirubin 0.8 0.0 - 1.2 mg/dL   GFR, Estimated >39 >39 mL/min   Anion gap 9 5 - 15   *Note: Due to a large number of results and/or encounters for the requested time period, some results have not been displayed. A complete set of results can be found in Results Review.   Lab Results  Component Value Date   VITAMINB12 726 11/05/2022   Lab Results  Component Value Date   VD25OH 68.62 11/05/2022      11/25/2023    3:47 PM 11/24/2022    3:03 PM 11/05/2022    9:32 AM 09/26/2021    9:40 AM 03/21/2020    3:38 PM  Depression screen PHQ 2/9  Decreased Interest 0 0 0  0 0  Down, Depressed, Hopeless 0 0 0 0 0  PHQ - 2 Score 0 0 0 0 0  Altered sleeping  0 0  0  Tired, decreased energy  0 1  0  Change in appetite  0 0  0  Feeling bad or failure about yourself   0 0  0  Trouble concentrating  0 0  0  Moving slowly or fidgety/restless  0 0  0  Suicidal thoughts  0 0  0  PHQ-9 Score  0 1  0  Difficult doing work/chores     Not difficult at all       11/05/2022    9:32 AM  GAD 7 : Generalized Anxiety Score  Nervous, Anxious, on Edge 1  Control/stop worrying 0  Worry too much - different things 0  Trouble relaxing 0  Restless 1  Easily  annoyed or irritable 1  Afraid - awful might happen 0  Total GAD 7 Score 3   Assessment & Plan:   Problem List Items Addressed This Visit   None    Meds ordered this encounter  Medications   DULoxetine (CYMBALTA) 20 MG capsule    Sig: Take 1 capsule (20 mg total) by mouth daily.    Dispense:  30 capsule    Refill:  6    No orders of the defined types were placed in this encounter.   Patient Instructions  Check if you're taking both zetia  (ezetimibe ) and simvastatin  (zocor ) both cholesterol medicines.  Try duloxentine (Cymbalta) 20mg  daily for mood and energy - will need to take for 3-4 weeks to get full benefit of medicine. Let me know sooner if any trouble tolerating this  Keep follow up for October   Follow up plan: No follow-ups on file.  Anton Blas, MD

## 2023-12-09 NOTE — Patient Instructions (Addendum)
 Check if you're taking both zetia  (ezetimibe ) and simvastatin  (zocor ) both cholesterol medicines.  Try duloxentine (Cymbalta) 20mg  daily for mood and energy - will need to take for 3-4 weeks to get full benefit of medicine. Let me know sooner if any trouble tolerating this  Keep follow up for October

## 2023-12-10 ENCOUNTER — Encounter: Payer: Self-pay | Admitting: Family Medicine

## 2023-12-10 DIAGNOSIS — R454 Irritability and anger: Secondary | ICD-10-CM | POA: Insufficient documentation

## 2023-12-10 NOTE — Assessment & Plan Note (Addendum)
 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  Reassuring neurological exam, recent labs which were also reviewed.  Pt and family are interested in trial of medication,  Avoiding anything that may contribute to agitation/insomnia (ie fluoxetine, sertraline, bupropion ).  Given endorsed ongoing frontal headache, will trial SNRI for possible pain modulating effect.  Start duloxetine 20mg  daily, monitoring for GI upset, worsening HA.  Discussed need to use medication for ~4 weeks before determining full effect. Update with effect, reassess when returns for CPE

## 2023-12-10 NOTE — Assessment & Plan Note (Signed)
 Confirmed he is taking simvastatin . Daughter will verify at home if he's also taking zetia .

## 2023-12-10 NOTE — Assessment & Plan Note (Signed)
 Appreciate oncology care.

## 2023-12-10 NOTE — Assessment & Plan Note (Signed)
 Trial cymbalta as per above.

## 2024-01-18 ENCOUNTER — Other Ambulatory Visit: Payer: Self-pay | Admitting: Cardiovascular Disease

## 2024-02-01 ENCOUNTER — Telehealth: Payer: Self-pay | Admitting: Cardiovascular Disease

## 2024-02-01 MED ORDER — EZETIMIBE 10 MG PO TABS: 10.0000 mg | ORAL_TABLET | Freq: Every day | ORAL | 0 refills | Status: DC

## 2024-02-01 NOTE — Telephone Encounter (Signed)
*  STAT* If patient is at the pharmacy, call can be transferred to refill team.   1. Which medications need to be refilled? (please list name of each medication and dose if known) ezetimibe  (ZETIA ) 10 MG tablet    2. Would you like to learn more about the convenience, safety, & potential cost savings by using the [facility]? No   3. Are you open to using the [facility] (Type [facility].) No   4. Which pharmacy/location (including street and city if local pharmacy) is medication to be sent to? [facility] - [place], [place] - [address]    5. Do they need a 30 day or 90 day supply? 30 day   Pt has scheduled appt on 9/22

## 2024-02-01 NOTE — Telephone Encounter (Signed)
 RX sent in

## 2024-02-19 ENCOUNTER — Inpatient Hospital Stay: Attending: Internal Medicine

## 2024-02-28 NOTE — Progress Notes (Unsigned)
 Cardiology Office Note  Date:  02/29/2024   ID:  Randall Horsey., DOB [birth year], MRN [id number]  PCP:  Rilla Baller, MD   Chief Complaint  Patient presents with   12 month follow up     Doing well.     HPI:  [name] is a very pleasant 83 year old gentleman with a history of  coronary artery disease, inferior wall MI 2001 with stent to the RCA,    hypertension,  hyperlipidemia.  Previous admit in 2011 for mild chest pain and sweating.  Stress test showed no ischemia with inferior scar.  Previous cardiac catheterization showing moderate OM disease.  Repeat catheterization  in August 2016 showing severe three-vessel disease,  sent for CABG on 01/24/2015.  Cath 2019:Severe three vessel disease, occluded SVG to the RCA, Patent SVG to OM 1 and OM2, patent LIMA to the LAD , occluded RCA Non-small cell lung cancer stage IV May 2022 He presents today for follow-up of his coronary artery disease  Last seen by myself in clinic 8/24 Presents today with family Sedentary, can't walk very far, has to stop and rest Gets hip pain, 2 years of symptoms Okay walking in the house, unable to walk very far outside the house  Early 2023 with vascular ultrasounds ABI and LE arterial: mild diffuse disease Normal ABIs  Used to go to the gym on a regular basis, Reports he no longer drives, does not go to the gym  No exertional shortness of breath, No chest pain linked with exertion  Work reviewed, LDL 80 up from 62, October 2023  EKG personally reviewed by myself on todays visit EKG Interpretation Date/Time:  Monday February 29 2024 14:20:24 EDT Ventricular Rate:  83 PR Interval:  160 QRS Duration:  96 QT Interval:  364 QTC Calculation: 427 R Axis:   -14  Text Interpretation: Normal sinus rhythm Inferior infarct (cited on or before 25-Oct-2018) When compared with ECG of 19-Jan-2023 08:51, Premature ventricular complexes are no longer Present Confirmed by Perla Lye  [phone]) on 02/29/2024 2:43:03 PM   Other past medical history reviewed diagnosed with non-small cell lung cancer, stage IV in 10/2020.   treated with CarboTaxol plus Keytruda  , prednisone  and chemo  Echo 05/2021: EF 50 to 55%  LE PCI followed by  common femoral endarterectomy extending into the profunda femoris and the SFA. -- Angioplasty to the right tibioperoneal trunk with 5 mm diameter Lutonix drug-coated angioplasty balloon 4.  Stent placement to the right tibioperoneal artery with 6 mm diameter by 4 cm length life stent 5.  Stent placement to the right SFA and popliteal artery x2 with 7 mm diameter by 20 cm length and 7 mm diameter by 15 cm length life stent postdilated with 7 mm diameter with Lutonix drug-coated angioplasty balloons 6.  Fogarty embolectomy balloon to the right SFA and popliteal arteries for thrombus after above procedures  Cardiac catheterization 06/10/2017 Severe three vessel disease, occluded SVG to the RCA, Patent SVG to OM 1 and OM2, patent LIMA to the LAD Will try medical management first Add plavix , nitrates  intervention would require atherectomy in [facility]  GERD sx improved on PPI EGD  07/2014 showing moderate to severe esophagitis  postoperative atrial fibrillation,  postoperative delirium and began having fevers on 01/29/2015.  some drainage from the inferior portion of his sternal wound. Blood cultures and wound cultures grew Enterobacter aerogenes. He was discharged on IV ceftriaxone   and completed 6 weeks of therapy . He took probiotics during this  time   PMH:   has a past medical history of Allergy, Arthritis, CAD (coronary artery disease), Cancer (HCC), Carotid artery disease (HCC), Cataract, Diastolic dysfunction, Erectile dysfunction, GERD (gastroesophageal reflux disease), History of elbow surgery, History of hiatal hernia, HLD (hyperlipidemia), HTN (hypertension), Inferior myocardial infarction (HCC) (03/2000), Lung cancer (HCC), Postoperative  wound infection (02/02/2015), Reflux esophagitis, and Sleep apnea (2017).  PSH:    Past Surgical History:  Procedure Laterality Date   arm surgery  2010   BROW LIFT Bilateral 11/25/2019   Procedure: BROW PTOSIS REPAIR BILATERAL;  Surgeon: Ashley Greig HERO, MD;  Location: [facility];  Service: Ophthalmology;  Laterality: Bilateral;  sleep apnea   CARDIAC CATHETERIZATION  06/24/2011   CARDIAC CATHETERIZATION N/A 01/18/2015   Procedure: Left Heart Cath with coronary angiography;  Surgeon: Evalene JINNY Lunger, MD;  Location: [facility];  Service: Cardiovascular;  Laterality: N/A;   CARDIAC CATHETERIZATION N/A 01/18/2015   Procedure: Intravascular Pressure Wire/FFR Study;  Surgeon: Deatrice DELENA Cage, MD;  Location: [facility];  Service: Cardiovascular;  Laterality: N/A;   CAROTID STENT  03/10/2011   COLONOSCOPY  2010   COLONOSCOPY  06/14/2014   Dr Albertus   COLONOSCOPY  01/2022   done for colon thickening at splenic flexure - 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[name])   CORONARY ARTERY BYPASS GRAFT N/A 01/24/2015   Procedure: CORONARY ARTERY BYPASS GRAFTING x 4 (LIMA-LAD, SVG-Int 1- Int 2, SVG-PD) ENDOSCOPIC GREATER SAPHENOUS VEIN HARVEST LEFT LEG;  Surgeon: Dallas KATHEE Jude, MD;  Location: [facility];  Service: Open Heart Surgery;  Laterality: N/A;   EMBOLECTOMY  06/15/2019   Procedure: EMBOLECTOMY;  Surgeon: Jama Cordella MATSU, MD;  Location: [facility];  Service: Vascular;;  right superficial femoral artery   ENDARTERECTOMY FEMORAL Right 06/15/2019   Procedure: ENDARTERECTOMY FEMORAL;  Surgeon: Jama Cordella MATSU, MD;  Location: [facility];  Service: Vascular;  Laterality: Right;  common femoral profunda femoris superficial femoral   ESOPHAGOGASTRODUODENOSCOPY (EGD) WITH PROPOFOL  N/A 04/24/2016   Procedure: ESOPHAGOGASTRODUODENOSCOPY (EGD) WITH PROPOFOL ;  Surgeon: Gordy HERO Albertus, MD;  Location: [facility];  Service: Gastroenterology;  Laterality: N/A;   EYE SURGERY      lasik 15 yrs. ago, cataracts removed - both eyes    HAMMER TOE SURGERY     right toe   INSERTION OF ILIAC STENT Right 06/15/2019   Procedure: INSERTION OF ILIAC STENT ( STENTING OF SFA/POP ARTERY );  Surgeon: Jama Cordella MATSU, MD;  Location: [facility];  Service: Vascular;  Laterality: Right;  angioplpasty and stent placement: right superficial femoral right tibiopopliteal trunk bilateral common iliac arteries   IR IMAGING GUIDED PORT INSERTION  11/09/2020   LEFT HEART CATH AND CORONARY ANGIOGRAPHY Left 06/10/2017   Procedure: LEFT HEART CATH AND CORONARY ANGIOGRAPHY;  Surgeon: Lunger Evalene JINNY, MD;  Location: [facility];  Service: Cardiovascular;  Laterality: Left;   LOWER EXTREMITY ANGIOGRAPHY Left 01/04/2019   Procedure: LOWER EXTREMITY ANGIOGRAPHY;  Surgeon: Jama Cordella MATSU, MD;  Location: [facility];  Service: Cardiovascular;  Laterality: Left;   LOWER EXTREMITY ANGIOGRAPHY Right 01/25/2019   Procedure: LOWER EXTREMITY ANGIOGRAPHY;  Surgeon: Jama Cordella MATSU, MD;  Location: [facility];  Service: Cardiovascular;  Laterality: Right;   LOWER EXTREMITY ANGIOGRAPHY Right 01/15/2021   LOWER EXTREMITY ANGIOGRAPHY and stent placement to R SFA and popliteal artery Zoe, Cordella MATSU, MD)   NASAL SINUS SURGERY  2008   septpolasty, bilateral turbinate reduction   SHOULDER ARTHROSCOPY  2012  TEE WITHOUT CARDIOVERSION N/A 01/24/2015   Procedure: TRANSESOPHAGEAL ECHOCARDIOGRAM (TEE);  Surgeon: Dallas KATHEE Jude, MD;  [facility];  Service: Open Heart Surgery;  Laterality: N/A;   TOE SURGERY  1994   UPPER GI ENDOSCOPY  07/2014, 04-24-16   Dr Lovenia   WRIST SURGERY  2011    Current Outpatient Medications  Medication Sig Dispense Refill   acetaminophen  (TYLENOL ) 325 MG tablet Take 650 mg by mouth as needed for moderate pain or mild pain.     albuterol  (VENTOLIN  HFA) 108 (90 Base) MCG/ACT inhaler Inhale 2 puffs into the lungs every 6 (six) hours as  needed for wheezing or shortness of breath. 18 g 2   Ascorbic Acid  (VITAMIN C ) 1000 MG tablet Take 1 tablet (1,000 mg total) by mouth daily.     aspirin  81 MG EC tablet Take 81 mg by mouth daily.       cetirizine (ZYRTEC) 10 MG chewable tablet Chew 10 mg by mouth daily.     Cholecalciferol  (VITAMIN D3) 50 MCG (2000 UT) TABS Take 2,000 Units by mouth daily. 30 tablet    Cyanocobalamin  (B-12) 5000 MCG CAPS Take 5,000 mcg by mouth daily.     docusate sodium  (COLACE) 100 MG capsule Take 200 mg by mouth at bedtime.     DULoxetine  (CYMBALTA ) 20 MG capsule Take 1 capsule (20 mg total) by mouth daily. 30 capsule 6   ezetimibe  (ZETIA ) 10 MG tablet Take 1 tablet (10 mg total) by mouth daily. 30 tablet 0   finasteride  (PROSCAR ) 5 MG tablet TAKE 1 TABLET BY MOUTH DAILY 90 tablet 0   fluticasone  (FLONASE ) 50 MCG/ACT nasal spray Place 2 sprays into both nostrils daily.     guaiFENesin (MUCINEX) 600 MG 12 hr tablet Take by mouth 2 (two) times daily.     lidocaine -prilocaine  (EMLA ) cream Apply 30 -45 mins prior to port access. 30 g 3   Melatonin 10 MG CAPS Take 10 mg by mouth at bedtime as needed (sleep).     nitroGLYCERIN  (NITROSTAT ) 0.4 MG SL tablet Place 1 tablet (0.4 mg total) under the tongue every 5 (five) minutes as needed for chest pain. 25 tablet 3   polyethylene glycol powder (GLYCOLAX /MIRALAX ) 17 GM/SCOOP powder Take 8.5-17 g by mouth daily as needed for moderate constipation. 510 g 1   simvastatin  (ZOCOR ) 40 MG tablet Take 1 tablet (40 mg total) by mouth at bedtime. 90 tablet 3   valsartan  (DIOVAN ) 80 MG tablet Take 40 mg by mouth daily.     Vitamin E  450 MG (1000 UT) CAPS Take 450 Units by mouth daily.     Zinc  50 MG TABS Take 50 mg by mouth daily.     No current facility-administered medications for this visit.   Facility-Administered Medications Ordered in Other Visits  Medication Dose Route Frequency Provider Last Rate Last Admin   heparin  lock flush 100 UNIT/ML injection              Allergies:   Patient has no known allergies.   Social History:  The patient  reports that he quit smoking about 23 years ago. His smoking use included cigarettes. He started smoking about 63 years ago. He has a 100 pack-year smoking history. He has never used smokeless tobacco. He reports current alcohol  use. He reports that he does not use drugs.   Family History:   family history includes Cancer in his paternal grandfather; Diabetes in his father; Heart disease in his mother; Heart disease (age  of onset: 38) in his brother; Hypertension in his father and mother; Lymphoma in his sister.   Review of Systems: Review of Systems  Constitutional: Negative.   Respiratory: Negative.    Cardiovascular: Negative.   Gastrointestinal: Negative.   Musculoskeletal: Negative.        Leg weakness  Neurological: Negative.   Psychiatric/Behavioral: Negative.    All other systems reviewed and are negative.   PHYSICAL EXAM: VS:  BP 120/60 (BP Location: Left Arm, Patient Position: Sitting, Cuff Size: Normal)   Pulse 83   Ht 5' 7 (1.702 m)   Wt 215 lb (97.5 kg)   SpO2 91%   BMI 33.67 kg/m  , BMI Body mass index is 33.67 kg/m. Constitutional:  oriented to person, place, and time. No distress.  HENT:  Head: Grossly normal Eyes:  no discharge. No scleral icterus.  Neck: No JVD, no carotid bruits  Cardiovascular: Regular rate and rhythm, no murmurs appreciated Pulmonary/Chest: Clear to auscultation bilaterally, no wheezes or rails Abdominal: Soft.  no distension.  no tenderness.  Musculoskeletal: Normal range of motion Neurological:  normal muscle tone. Coordination normal. No atrophy Skin: Skin warm and dry Psychiatric: normal affect, pleasant   Recent Labs: 11/20/2023: ALT 16; BUN 16; Creatinine 1.12; Hemoglobin 15.1; Platelet Count 217; Potassium 4.0; Sodium 138; TSH 4.232    Lipid Panel Lab Results  Component Value Date   CHOL 151 04/08/2022   HDL 42.90 04/08/2022   LDLCALC 80  04/08/2022   TRIG 138.0 04/08/2022    Wt Readings from Last 3 Encounters:  02/29/24 215 lb (97.5 kg)  12/09/23 217 lb 8 oz (98.7 kg)  11/25/23 214 lb (97.1 kg)     ASSESSMENT AND PLAN:  Atherosclerosis of native coronary artery of native heart without angina pectoris -  Currently with no symptoms of angina. No further workup at this time. Continue current medication regimen.  No further testing at this time  Paroxysmal atrial fibrillation (HCC) - Plan: EKG 12-Lead Prior history of postop A. fib after bypass surgery Previously reported that he did not want anticoagulation,  Maintaining normal sinus rhythm on today's visit  PAD (peripheral artery disease) (HCC) Major procedure by vascular 06/2019 On asa 81, not on Plavix  Goal LDL <70 Has not seen vascular in several years, family declining ultrasounds at this time Severe bilateral hip pain on ambulation over the past 2 years.  Able to walk in the house, not much outside the house  Pure hypercholesterolemia Simvastatin  Zetia  Goal LDL less than 70  Essential hypertension Blood pressure is well controlled on today's visit. No changes made to the medications.  S/P CABG x 4 Having atypical chest pain, musculoskeletal No angina, no further testing  GERD moderate to severe esophagitis on EGD  On ppi, stable sx  erectile dysfunction Followed by PMD  Gait instability, hip pain, leg weakness Recommend we talk with primary care concerning home PT Very sedentary at baseline, unable to go to the gym secondary to transportation issues Not motivated to use his home gym Ideally would benefit from recumbent bike at home    Orders Placed This Encounter  Procedures   EKG 12-Lead     Signed, Velinda Lunger, M.D., Ph.D. 02/29/2024  [facility] [place], [place] [phone]

## 2024-02-29 ENCOUNTER — Encounter: Payer: Self-pay | Admitting: Cardiovascular Disease

## 2024-02-29 ENCOUNTER — Ambulatory Visit: Attending: Cardiovascular Disease | Admitting: Cardiovascular Disease

## 2024-02-29 VITALS — BP 120/60 | HR 83 | Ht 67.0 in | Wt 215.0 lb

## 2024-02-29 DIAGNOSIS — E785 Hyperlipidemia, unspecified: Secondary | ICD-10-CM | POA: Diagnosis not present

## 2024-02-29 DIAGNOSIS — I10 Essential (primary) hypertension: Secondary | ICD-10-CM | POA: Diagnosis not present

## 2024-02-29 DIAGNOSIS — C3492 Malignant neoplasm of unspecified part of left bronchus or lung: Secondary | ICD-10-CM

## 2024-02-29 DIAGNOSIS — I739 Peripheral vascular disease, unspecified: Secondary | ICD-10-CM | POA: Diagnosis not present

## 2024-02-29 DIAGNOSIS — I480 Paroxysmal atrial fibrillation: Secondary | ICD-10-CM | POA: Diagnosis not present

## 2024-02-29 DIAGNOSIS — Z951 Presence of aortocoronary bypass graft: Secondary | ICD-10-CM | POA: Diagnosis not present

## 2024-02-29 DIAGNOSIS — E782 Mixed hyperlipidemia: Secondary | ICD-10-CM

## 2024-02-29 DIAGNOSIS — I25118 Atherosclerotic heart disease of native coronary artery with other forms of angina pectoris: Secondary | ICD-10-CM | POA: Diagnosis not present

## 2024-02-29 MED ORDER — SIMVASTATIN 40 MG PO TABS: 40.0000 mg | ORAL_TABLET | Freq: Every day | ORAL | 3 refills | Status: AC

## 2024-02-29 NOTE — Patient Instructions (Addendum)

## 2024-03-02 ENCOUNTER — Other Ambulatory Visit: Payer: Self-pay | Admitting: Family Medicine

## 2024-03-02 DIAGNOSIS — N401 Enlarged prostate with lower urinary tract symptoms: Secondary | ICD-10-CM

## 2024-03-03 NOTE — Telephone Encounter (Signed)
 Name of Medication: finasteride  (PROSCAR ) 5  Name of Pharmacy: [facility] Last [place] or Written Date and Quantity: 12/01/23 #90 Last Office Visit and Type: 12/09/23 Next Office Visit and Type: 04/08/24

## 2024-03-08 ENCOUNTER — Encounter: Payer: Self-pay | Admitting: Internal Medicine

## 2024-03-12 ENCOUNTER — Other Ambulatory Visit: Payer: Self-pay | Admitting: Cardiovascular Disease

## 2024-04-08 ENCOUNTER — Encounter: Admitting: Family Medicine

## 2024-04-19 ENCOUNTER — Encounter: Payer: Self-pay | Admitting: Family Medicine

## 2024-04-19 ENCOUNTER — Ambulatory Visit (INDEPENDENT_AMBULATORY_CARE_PROVIDER_SITE_OTHER): Admitting: Family Medicine

## 2024-04-19 VITALS — BP 130/62 | HR 78 | Temp 97.5°F | Ht 67.0 in | Wt 217.5 lb

## 2024-04-19 DIAGNOSIS — N401 Enlarged prostate with lower urinary tract symptoms: Secondary | ICD-10-CM | POA: Diagnosis not present

## 2024-04-19 DIAGNOSIS — I70213 Atherosclerosis of native arteries of extremities with intermittent claudication, bilateral legs: Secondary | ICD-10-CM | POA: Diagnosis not present

## 2024-04-19 DIAGNOSIS — G44229 Chronic tension-type headache, not intractable: Secondary | ICD-10-CM

## 2024-04-19 DIAGNOSIS — I4891 Unspecified atrial fibrillation: Secondary | ICD-10-CM

## 2024-04-19 DIAGNOSIS — R4189 Other symptoms and signs involving cognitive functions and awareness: Secondary | ICD-10-CM | POA: Diagnosis not present

## 2024-04-19 DIAGNOSIS — E66811 Obesity, class 1: Secondary | ICD-10-CM | POA: Diagnosis not present

## 2024-04-19 DIAGNOSIS — I70229 Atherosclerosis of native arteries of extremities with rest pain, unspecified extremity: Secondary | ICD-10-CM | POA: Diagnosis not present

## 2024-04-19 DIAGNOSIS — Z951 Presence of aortocoronary bypass graft: Secondary | ICD-10-CM

## 2024-04-19 DIAGNOSIS — I2510 Atherosclerotic heart disease of native coronary artery without angina pectoris: Secondary | ICD-10-CM

## 2024-04-19 DIAGNOSIS — C3432 Malignant neoplasm of lower lobe, left bronchus or lung: Secondary | ICD-10-CM

## 2024-04-19 DIAGNOSIS — G4733 Obstructive sleep apnea (adult) (pediatric): Secondary | ICD-10-CM

## 2024-04-19 DIAGNOSIS — Z0001 Encounter for general adult medical examination with abnormal findings: Secondary | ICD-10-CM | POA: Diagnosis not present

## 2024-04-19 DIAGNOSIS — I10 Essential (primary) hypertension: Secondary | ICD-10-CM

## 2024-04-19 DIAGNOSIS — Z23 Encounter for immunization: Secondary | ICD-10-CM | POA: Diagnosis not present

## 2024-04-19 DIAGNOSIS — L98499 Non-pressure chronic ulcer of skin of other sites with unspecified severity: Secondary | ICD-10-CM

## 2024-04-19 DIAGNOSIS — E782 Mixed hyperlipidemia: Secondary | ICD-10-CM | POA: Diagnosis not present

## 2024-04-19 DIAGNOSIS — Z7189 Other specified counseling: Secondary | ICD-10-CM

## 2024-04-19 NOTE — Patient Instructions (Addendum)
 Flu shot today , prevnar-20 today  Labs today  Ok to increase duloxetine  (Cymbalta ) to 20mg  2 tablets (40mg ) in the morning, update with effect.  Continue other medicines  Use triple antibiotic or neosporin ointment to right ear lesion - if not healing I recommend seeing Dr Dela.  Return in 6 months for follow up visit.

## 2024-04-19 NOTE — Assessment & Plan Note (Signed)
 Advanced directive discussion - has completed this at home. HCPOA would be daughter [place]. Asked to bring us  a copy.

## 2024-04-19 NOTE — Progress Notes (Unsigned)
 Ph: [phone] Fax: [phone]   Patient ID: Randall LITTIE Chesley Mickey., male    DOB: [birth year], 83 y.o.   MRN: [id number]  This visit was conducted in person.  BP 130/62   Pulse 78   Temp (!) 97.5 F (36.4 C) (Oral)   Ht 5' 7 (1.702 m)   Wt 217 lb 8 oz (98.7 kg)   SpO2 93%   BMI 34.07 kg/m    CC: CPE Subjective:   HPI: Randall Ali. is a 83 y.o. male presenting on 04/19/2024 for Annual Exam (Pt accompanied by daughter Luke)   Saw health advisor 11/2023 for medicare wellness visit. Note reviewed. Failed cognitive eval with 6CIT score 21.   No results found.  Flowsheet Row Office Visit from 04/19/2024 in [facility] [place]  PHQ-2 Total Score 0       04/19/2024    2:31 PM 12/09/2023    2:21 PM 11/22/2023    5:56 PM 11/24/2022    2:55 PM 11/20/2022    8:08 PM  Fall Risk   Falls in the past year? 0 0 0 0 0  Number falls in past yr: 0  0 0   Injury with Fall? 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 Has previously seen neuro-oncology Dr Buckley for this - released from his care 10/2022.   Cymbalta  has been helpful for mood.  Notes headaches also have improved on cymbalta .   OSA continues CPAP followed by pulm, next appt 05/2024.   Preventative: Colon cancer screening - colonoscopy 06/2014 melanosis, fair prep consider cologuard 5 yrs, rpt 10 yrs ([name]) - age out  Prostate cancer screening - will stop Lung cancer - see above for personal history of lung cancer.  Flu shot - yearly  COVID vaccine - Pfizer 06/2019, 07/2019, no  booster Tdap 2016 Pneumovax 2013, prevnar-13 2016, prevnar-20 today zostavax 2012 Shingrix - 09/2017, 02/2018 Advanced directive discussion - has completed this at home. HCPOA would be daughter [place]. Asked to bring us  a copy.  Seat belt use discussed Sunscreen use discussed. No changing moles on skin. Sees derm q3 mo.  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 Interim medical history since our last visit reviewed. Allergies and medications reviewed and updated. Outpatient Medications Prior to Visit  Medication Sig Dispense Refill   acetaminophen  (TYLENOL ) 325 MG tablet Take 650 mg by mouth as needed for moderate pain or mild pain.     albuterol  (VENTOLIN  HFA) 108 (90 Base) MCG/ACT inhaler Inhale 2 puffs into the lungs every 6 (six) hours as needed for wheezing or shortness of breath. 18 g 2   Ascorbic Acid  (VITAMIN C ) 1000 MG tablet  Take 1 tablet (1,000 mg total) by mouth daily.     aspirin  81 MG EC tablet Take 81 mg by mouth daily.       cetirizine (ZYRTEC) 10 MG chewable tablet Chew 10 mg by mouth daily.     Cholecalciferol  (VITAMIN D3) 50 MCG (2000 UT) TABS Take 2,000 Units by mouth daily. 30 tablet    clopidogrel  (PLAVIX ) 75 MG tablet 1 tablet Orally Once a day; Duration: 30 day(s)     Cyanocobalamin  (B-12) 5000 MCG CAPS Take 5,000 mcg by mouth daily.     docusate sodium  (COLACE) 100 MG capsule Take 200 mg by mouth at bedtime.     ezetimibe  (ZETIA ) 10 MG tablet TAKE 1 TABLET BY MOUTH DAILY 90 tablet 3   fluticasone  (FLONASE ) 50 MCG/ACT nasal spray Place 2 sprays into both nostrils daily.     guaiFENesin (MUCINEX) 600 MG 12 hr tablet  Take by mouth 2 (two) times daily.     lidocaine -prilocaine  (EMLA ) cream Apply 30 -45 mins prior to port access. 30 g 3   Melatonin 10 MG CAPS Take 10 mg by mouth at bedtime as needed (sleep).     metoprolol  tartrate (LOPRESSOR ) 25 MG tablet 1 tablet with food Orally Twice a day; Duration: 30 day(s)     nitroGLYCERIN  (NITROSTAT ) 0.4 MG SL tablet Place 1 tablet (0.4 mg total) under the tongue every 5 (five) minutes as needed for chest pain. 25 tablet 3   pantoprazole  (PROTONIX ) 40 MG tablet 1 tablet Orally Once a day; Duration: 30 day(s)     polyethylene glycol powder (GLYCOLAX /MIRALAX ) 17 GM/SCOOP powder Take 8.5-17 g by mouth daily as needed for moderate constipation. 510 g 1   simvastatin  (ZOCOR ) 40 MG tablet Take 1 tablet (40 mg total) by mouth at bedtime. 90 tablet 3   tamsulosin  (FLOMAX ) 0.4 MG CAPS capsule 1 capsule.     valsartan  (DIOVAN ) 80 MG tablet Take 40 mg by mouth daily.     Vitamin E  450 MG (1000 UT) CAPS Take 450 Units by mouth daily.     Zinc  50 MG TABS Take 50 mg by mouth daily.     DULoxetine  (CYMBALTA ) 20 MG capsule Take 1 capsule (20 mg total) by mouth daily. 30 capsule 6   finasteride  (PROSCAR ) 5 MG tablet TAKE 1 TABLET BY MOUTH DAILY 90 tablet 0   lisinopril  (ZESTRIL ) 5 MG tablet 1 tablet Orally Once a day; Duration: 30 day(s)     Facility-Administered Medications Prior to Visit  Medication Dose Route Frequency Provider Last Rate Last Admin   heparin  lock flush 100 UNIT/ML injection              Per HPI unless specifically indicated in ROS section below Review of Systems  Constitutional:  Negative for activity change, appetite change, chills, fatigue, fever and unexpected weight change.  HENT:  Positive for congestion. Negative for hearing loss.   Eyes:  Negative for visual disturbance.  Respiratory:  Positive for cough (fall- allergy related). Negative for chest tightness, shortness of breath and wheezing.   Cardiovascular:  Negative for chest pain, palpitations and  leg swelling.  Gastrointestinal:  Negative for abdominal distention, abdominal pain, blood in stool, constipation, diarrhea, nausea and vomiting.  Genitourinary:  Negative for difficulty urinating and hematuria.  Musculoskeletal:  Negative for arthralgias, myalgias and neck pain.  Skin:  Negative for rash.  Neurological:  Positive for headaches (occ). Negative for dizziness, seizures and syncope.  Hematological:  Negative for adenopathy. Bruises/bleeds easily.  Psychiatric/Behavioral:  Negative for dysphoric mood (improved). The patient is not nervous/anxious.     Objective:  BP 130/62   Pulse 78   Temp (!) 97.5 F (36.4 C) (Oral)   Ht 5' 7 (1.702 m)   Wt 217 lb 8 oz (98.7 kg)   SpO2 93%   BMI 34.07 kg/m   Wt Readings from Last 3 Encounters:  04/19/24 217 lb 8 oz (98.7 kg)  02/29/24 215 lb (97.5 kg)  12/09/23 217 lb 8 oz (98.7 kg)      Physical Exam Vitals and nursing note reviewed.  Constitutional:      General: He is not in acute distress.    Appearance: Normal appearance. He is well-developed. He is not ill-appearing.  HENT:     Head: Normocephalic and atraumatic.     Right Ear: Hearing, tympanic membrane, ear canal and external ear normal.     Left Ear: Hearing, tympanic membrane, ear canal and external ear normal.     Ears:      Comments: Chronic appearing ulcer to R scapha    Mouth/Throat:     Mouth: Mucous membranes are moist.     Pharynx: Oropharynx is clear. No oropharyngeal exudate or posterior oropharyngeal erythema.  Eyes:     General: No scleral icterus.    Extraocular Movements: Extraocular movements intact.     Conjunctiva/sclera: Conjunctivae normal.     Pupils: Pupils are equal, round, and reactive to light.  Neck:     Thyroid : No thyroid  mass or thyromegaly.     Vascular: No carotid bruit.  Cardiovascular:     Rate and Rhythm: Normal rate and regular rhythm.     Pulses: Normal pulses.          Radial pulses are 2+ on the right side and 2+ on the  left side.     Heart sounds: Normal heart sounds. No murmur heard. Pulmonary:     Effort: Pulmonary effort is normal. No respiratory distress.     Breath sounds: Normal breath sounds. No wheezing, rhonchi or rales.  Abdominal:     General: Bowel sounds are normal. There is no distension.     Palpations: Abdomen is soft. There is no mass.     Tenderness: There is no abdominal tenderness. There is no guarding or rebound.     Hernia: No hernia is present.  Musculoskeletal:        General: Normal range of motion.     Cervical back: Normal range of motion and neck supple.     Right lower leg: No edema.     Left lower leg: No edema.  Lymphadenopathy:     Cervical: No cervical adenopathy.  Skin:    General: Skin is warm and dry.     Findings: No rash.  Neurological:     General: No focal deficit present.     Mental Status: He is alert and oriented to person, place, and time.  Psychiatric:        Mood and Affect: Mood normal.        Behavior: Behavior normal.        Thought Content: Thought content normal.        Judgment: Judgment normal.       Results for orders placed or performed in visit on 04/19/24  Lipid panel   Collection Time: 04/19/24  3:21 PM  Result Value Ref Range   Cholesterol 102 0 - 200 mg/dL   Triglycerides 09.9 0.0 - 149.0 mg/dL  HDL 43.60 >39.00 mg/dL   VLDL 81.9 0.0 - 59.9 mg/dL   LDL Cholesterol 40 0 - 99 mg/dL   Total CHOL/HDL Ratio 2    NonHDL 58.34   Vitamin B12   Collection Time: 04/19/24  3:21 PM  Result Value Ref Range   Vitamin B-12 457 211 - 911 pg/mL   *Note: Due to a large number of results and/or encounters for the requested time period, some results have not been displayed. A complete set of results can be found in Results Review.   Lab Results  Component Value Date   NA 138 11/20/2023   CL 107 11/20/2023   K 4.0 11/20/2023   CO2 22 11/20/2023   BUN 16 11/20/2023   CREATININE 1.12 11/20/2023   GFRNONAA >60 11/20/2023   CALCIUM  9.1  11/20/2023   PHOS 3.6 02/01/2015   ALBUMIN  4.2 11/20/2023   GLUCOSE 103 (H) 11/20/2023    Lab Results  Component Value Date   ALT 16 11/20/2023   AST 17 11/20/2023   ALKPHOS 60 11/20/2023   BILITOT 0.8 11/20/2023    Lab Results  Component Value Date   WBC 7.4 11/20/2023   HGB 15.1 11/20/2023   HCT 44.0 11/20/2023   MCV 90.5 11/20/2023   PLT 217 11/20/2023    Assessment & Plan:   Problem List Items Addressed This Visit     Encounter for general adult medical examination with abnormal findings - Primary (Chronic)   Preventative protocols reviewed and updated unless pt declined. Discussed healthy diet and lifestyle.       Advanced care planning/counseling discussion (Chronic)   Advanced directive discussion - has completed this at home. HCPOA would be daughter [place]. Asked to bring us  a copy.       CAD, NATIVE VESSEL   Appreciate cardiology care.       Relevant Medications   metoprolol  tartrate (LOPRESSOR ) 25 MG tablet   Atherosclerosis of native arteries of extremity with intermittent claudication   Relevant Medications   metoprolol  tartrate (LOPRESSOR ) 25 MG tablet   Hyperlipidemia   Chronic, stable on simvastatin  and zetia , update FLP. The ASCVD Risk score (Arnett DK, et al., 2019) failed to calculate for the following reasons:   The 2019 ASCVD risk score is only valid for ages 65 to 44   Risk score cannot be calculated because patient has a medical history suggesting prior/existing ASCVD       Relevant Medications   metoprolol  tartrate (LOPRESSOR ) 25 MG tablet   Other Relevant Orders   Lipid panel (Completed)   Essential hypertension   Chronic, stable on metoprolol  25mg  bid and valsartan  80mg  1/2 tab daily.  Previously ACEI stopped due to globus sensation - but lisinopril  5mg  is still on med list - will clarify which he's taking      Relevant Medications   metoprolol  tartrate (LOPRESSOR ) 25 MG tablet   OSA (obstructive sleep apnea)   Continues CPAP  therapy.       S/P CABG x 4   Postoperative atrial fibrillation (HCC)   Sounds regular today - ?isolated post-op after bypass surgery 2021. Had previously declined AC      Relevant Medications   metoprolol  tartrate (LOPRESSOR ) 25 MG tablet   BPH (benign prostatic hyperplasia)   Relevant Medications   tamsulosin  (FLOMAX ) 0.4 MG CAPS capsule   finasteride  (PROSCAR ) 5 MG tablet   Chronic headache   Cymbalta  may have helped with this - see above re -titration      Relevant  Medications   metoprolol  tartrate (LOPRESSOR ) 25 MG tablet   DULoxetine  (CYMBALTA ) 20 MG capsule   Atherosclerosis of artery of extremity with rest pain (HCC)   Continue statin, zetia , aspirin . Unsure if taking plavix .  Some claudication, not activity limiting, overall sedentary - encouraged working on staying physically active.  Last saw [facility] Dr Jama 07/2021      Relevant Medications   metoprolol  tartrate (LOPRESSOR ) 25 MG tablet   Cancer of lower lobe of left lung (HCC)   Appreciate onc care - regularly sees Dr Rennie q70mo      Obesity, Class I, BMI 30-34.9   Stable weight. Encouraged physical activity to effect sustainable weight.       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  This year cognitive eval with 6CIT score of 21.  Released from neuro-onc care 10/2022.  Increase cymbalta  as per below, continue to monitor cognition .       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  Rec regular topical neosporin or triple abx ointment and if not healing over 1-2 weeks, f/u with derm Dr Dela to r/o skin cancer.       Other Visit Diagnoses       Encounter for immunization       Relevant Orders   Flu vaccine HIGH DOSE PF(Fluzone Trivalent) (Completed)     Need for vaccination against Streptococcus pneumoniae       Relevant Orders   Pneumococcal conjugate  vaccine 20-valent (Prevnar 20) (Completed)        Meds ordered this encounter  Medications   DULoxetine  (CYMBALTA ) 20 MG capsule    Sig: Take 2 capsules (40 mg total) by mouth daily.    Dispense:  180 capsule    Refill:  3   finasteride  (PROSCAR ) 5 MG tablet    Sig: Take 1 tablet (5 mg total) by mouth daily.    Dispense:  90 tablet    Refill:  3    Orders Placed This Encounter  Procedures   Flu vaccine HIGH DOSE PF(Fluzone Trivalent)   Pneumococcal conjugate vaccine 20-valent (Prevnar 20)   Lipid panel   Vitamin B12    Patient Instructions  Flu shot today , prevnar-20 today  Labs today  Ok to increase duloxetine  (Cymbalta ) to 20mg  2 tablets (40mg ) in the morning, update with effect.  Continue other medicines  Use triple antibiotic or neosporin ointment to right ear lesion - if not healing I recommend seeing Dr Dela.  Return in 6 months for follow up visit.   Follow up plan: Return in about 6 months (around 10/17/2024), or if symptoms worsen or fail to improve, for follow up visit.  Anton Blas, MD

## 2024-04-19 NOTE — Assessment & Plan Note (Signed)
 Preventative protocols reviewed and updated unless pt declined. Discussed healthy diet and lifestyle.

## 2024-04-20 LAB — LIPID PANEL
Cholesterol: 102 mg/dL (ref 0–200)
HDL: 43.6 mg/dL (ref >39.00)
LDL Cholesterol: 40 mg/dL (ref 0–99)
NonHDL: 58.34
Total CHOL/HDL Ratio: 2
Triglycerides: 90 mg/dL (ref 0.0–149.0)
VLDL: 18 mg/dL (ref 0.0–40.0)

## 2024-04-20 LAB — VITAMIN B12: Vitamin B-12: 457 pg/mL (ref 211–911)

## 2024-04-21 ENCOUNTER — Ambulatory Visit: Payer: Self-pay | Admitting: Family Medicine

## 2024-04-21 DIAGNOSIS — L98499 Non-pressure chronic ulcer of skin of other sites with unspecified severity: Secondary | ICD-10-CM | POA: Insufficient documentation

## 2024-04-21 MED ORDER — FINASTERIDE 5 MG PO TABS: 5.0000 mg | ORAL_TABLET | Freq: Every day | ORAL | 3 refills | Status: AC

## 2024-04-21 MED ORDER — DULOXETINE HCL 20 MG PO CPEP: 40.0000 mg | ORAL_CAPSULE | Freq: Every day | ORAL | 3 refills | Status: AC

## 2024-04-21 NOTE — Assessment & Plan Note (Addendum)
 Stable weight. Encouraged physical activity to effect sustainable weight.

## 2024-04-21 NOTE — Assessment & Plan Note (Signed)
 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  This year cognitive eval with 6CIT score of 21.  Released from neuro-onc care 10/2022.  Increase cymbalta  as per below, continue to monitor cognition .

## 2024-04-21 NOTE — Assessment & Plan Note (Signed)
 Appreciate onc care - regularly sees Dr Brahmanday q29mo

## 2024-04-21 NOTE — Assessment & Plan Note (Addendum)
 Continue statin, zetia , aspirin . Unsure if taking plavix .  Some claudication, not activity limiting, overall sedentary - encouraged working on staying physically active.  Last saw [facility] Dr Jama 07/2021

## 2024-04-21 NOTE — Assessment & Plan Note (Addendum)
 Sounds regular today - ?isolated post-op after bypass surgery 2021. Had previously declined [facility]

## 2024-04-21 NOTE — Assessment & Plan Note (Signed)
Continues CPAP therapy. 

## 2024-04-21 NOTE — Assessment & Plan Note (Signed)
Appreciate cardiology care.  °

## 2024-04-21 NOTE — Assessment & Plan Note (Addendum)
 Chronic, stable on simvastatin  and zetia , update FLP. The ASCVD Risk score (Arnett DK, et al., 2019) failed to calculate for the following reasons:   The 2019 ASCVD risk score is only valid for ages 30 to 47   Risk score cannot be calculated because patient has a medical history suggesting prior/existing ASCVD

## 2024-04-21 NOTE — Assessment & Plan Note (Addendum)
 Chronic, stable on metoprolol  25mg  bid and valsartan  80mg  1/2 tab daily.  Previously ACEI stopped due to globus sensation - but lisinopril  5mg  is still on med list - will clarify which he's taking

## 2024-04-21 NOTE — Assessment & Plan Note (Addendum)
 Chronic appearing ulcer to scapha of right ear.  Rec regular topical neosporin or triple abx ointment and if not healing over 1-2 weeks, f/u with derm Dr Dela to r/o skin cancer.

## 2024-04-21 NOTE — Assessment & Plan Note (Signed)
 Cymbalta  may have helped with this - see above re -titration

## 2024-04-22 ENCOUNTER — Telehealth: Payer: Self-pay | Admitting: Internal Medicine

## 2024-04-22 NOTE — Telephone Encounter (Signed)
 Called pt to verify CT appt - left vm w/appt info - LH

## 2024-04-27 NOTE — Addendum Note (Signed)
 Addended by: RILLA BALLER on: 04/27/2024 07:45 AM   Modules accepted: Orders

## 2024-04-29 ENCOUNTER — Ambulatory Visit
Admission: RE | Admit: 2024-04-29 | Discharge: 2024-04-29 | Disposition: A | Discharge status: Home / Self Care | Source: Ambulatory Visit | Attending: Internal Medicine | Admitting: Internal Medicine

## 2024-04-29 ENCOUNTER — Encounter: Payer: Self-pay | Admitting: Internal Medicine

## 2024-04-29 DIAGNOSIS — C3432 Malignant neoplasm of lower lobe, left bronchus or lung: Secondary | ICD-10-CM

## 2024-04-29 DIAGNOSIS — R911 Solitary pulmonary nodule: Secondary | ICD-10-CM | POA: Diagnosis not present

## 2024-04-29 DIAGNOSIS — C3490 Malignant neoplasm of unspecified part of unspecified bronchus or lung: Secondary | ICD-10-CM | POA: Diagnosis not present

## 2024-04-29 DIAGNOSIS — K7689 Other specified diseases of liver: Secondary | ICD-10-CM | POA: Diagnosis not present

## 2024-04-29 DIAGNOSIS — J432 Centrilobular emphysema: Secondary | ICD-10-CM | POA: Diagnosis not present

## 2024-04-29 MED ORDER — SODIUM CHLORIDE 0.9% FLUSH
10.0000 mL | INTRAVENOUS | Status: DC | PRN
  Administered 2024-04-29: 10 mL via INTRAVENOUS

## 2024-04-29 MED ORDER — HEPARIN SOD (PORK) LOCK FLUSH 100 UNIT/ML IV SOLN
500.0000 [IU] | Freq: Once | INTRAVENOUS | Status: AC
  Administered 2024-04-29: 500 [IU] via INTRAVENOUS

## 2024-04-29 MED ORDER — IOPAMIDOL (ISOVUE-300) INJECTION 61%
100.0000 mL | Freq: Once | INTRAVENOUS | Status: AC | PRN
Start: 2024-04-29 — End: 2024-04-29
  Administered 2024-04-29: 100 mL via INTRAVENOUS

## 2024-05-20 ENCOUNTER — Encounter: Payer: Self-pay | Admitting: Internal Medicine

## 2024-05-20 ENCOUNTER — Inpatient Hospital Stay: Attending: Internal Medicine

## 2024-05-20 ENCOUNTER — Telehealth: Payer: Self-pay | Admitting: Internal Medicine

## 2024-05-20 ENCOUNTER — Ambulatory Visit: Attending: Internal Medicine | Admitting: Internal Medicine

## 2024-05-20 VITALS — BP 118/63 | HR 76 | Temp 97.6°F | Resp 20 | Ht 67.0 in | Wt 214.4 lb

## 2024-05-20 DIAGNOSIS — Z87891 Personal history of nicotine dependence: Secondary | ICD-10-CM | POA: Diagnosis not present

## 2024-05-20 DIAGNOSIS — C3432 Malignant neoplasm of lower lobe, left bronchus or lung: Secondary | ICD-10-CM | POA: Insufficient documentation

## 2024-05-20 DIAGNOSIS — Z452 Encounter for adjustment and management of vascular access device: Secondary | ICD-10-CM | POA: Insufficient documentation

## 2024-05-20 DIAGNOSIS — I2510 Atherosclerotic heart disease of native coronary artery without angina pectoris: Secondary | ICD-10-CM | POA: Diagnosis not present

## 2024-05-20 DIAGNOSIS — Z7982 Long term (current) use of aspirin: Secondary | ICD-10-CM | POA: Diagnosis not present

## 2024-05-20 DIAGNOSIS — Z807 Family history of other malignant neoplasms of lymphoid, hematopoietic and related tissues: Secondary | ICD-10-CM | POA: Diagnosis not present

## 2024-05-20 DIAGNOSIS — G4730 Sleep apnea, unspecified: Secondary | ICD-10-CM | POA: Insufficient documentation

## 2024-05-20 DIAGNOSIS — K7689 Other specified diseases of liver: Secondary | ICD-10-CM | POA: Insufficient documentation

## 2024-05-20 DIAGNOSIS — I252 Old myocardial infarction: Secondary | ICD-10-CM | POA: Insufficient documentation

## 2024-05-20 DIAGNOSIS — Z79899 Other long term (current) drug therapy: Secondary | ICD-10-CM | POA: Diagnosis not present

## 2024-05-20 DIAGNOSIS — I10 Essential (primary) hypertension: Secondary | ICD-10-CM | POA: Insufficient documentation

## 2024-05-20 DIAGNOSIS — Z85820 Personal history of malignant melanoma of skin: Secondary | ICD-10-CM | POA: Diagnosis not present

## 2024-05-20 DIAGNOSIS — Z9221 Personal history of antineoplastic chemotherapy: Secondary | ICD-10-CM | POA: Diagnosis not present

## 2024-05-20 DIAGNOSIS — Z9226 Personal history of immune checkpoint inhibitor therapy: Secondary | ICD-10-CM | POA: Insufficient documentation

## 2024-05-20 DIAGNOSIS — Z7902 Long term (current) use of antithrombotics/antiplatelets: Secondary | ICD-10-CM | POA: Insufficient documentation

## 2024-05-20 DIAGNOSIS — E785 Hyperlipidemia, unspecified: Secondary | ICD-10-CM | POA: Diagnosis not present

## 2024-05-20 DIAGNOSIS — Z85118 Personal history of other malignant neoplasm of bronchus and lung: Secondary | ICD-10-CM | POA: Diagnosis not present

## 2024-05-20 DIAGNOSIS — F32A Depression, unspecified: Secondary | ICD-10-CM | POA: Diagnosis not present

## 2024-05-20 LAB — CBC WITH DIFFERENTIAL (CANCER CENTER ONLY)
Abs Immature Granulocytes: 0.05 K/uL (ref 0.00–0.07)
Basophils Absolute: 0.1 K/uL (ref 0.0–0.1)
Basophils Relative: 1 %
Eosinophils Absolute: 0.1 K/uL (ref 0.0–0.5)
Eosinophils Relative: 2 %
HCT: 43.4 % (ref 39.0–52.0)
Hemoglobin: 14.6 g/dL (ref 13.0–17.0)
Immature Granulocytes: 1 %
Lymphocytes Relative: 16 %
Lymphs Abs: 1.2 K/uL (ref 0.7–4.0)
MCH: 30.9 pg (ref 26.0–34.0)
MCHC: 33.6 g/dL (ref 30.0–36.0)
MCV: 91.9 fL (ref 80.0–100.0)
Monocytes Absolute: 0.8 K/uL (ref 0.1–1.0)
Monocytes Relative: 11 %
Neutro Abs: 5.4 K/uL (ref 1.7–7.7)
Neutrophils Relative %: 69 %
Platelet Count: 209 K/uL (ref 150–400)
RBC: 4.72 MIL/uL (ref 4.22–5.81)
RDW: 12.5 % (ref 11.5–15.5)
WBC Count: 7.7 K/uL (ref 4.0–10.5)
nRBC: 0 % (ref 0.0–0.2)

## 2024-05-20 LAB — CMP (CANCER CENTER ONLY)
ALT: 14 U/L (ref 0–44)
AST: 16 U/L (ref 15–41)
Albumin: 4.4 g/dL (ref 3.5–5.0)
Alkaline Phosphatase: 66 U/L (ref 38–126)
Anion gap: 11 (ref 5–15)
BUN: 15 mg/dL (ref 8–23)
CO2: 22 mmol/L (ref 22–32)
Calcium: 9.7 mg/dL (ref 8.9–10.3)
Chloride: 104 mmol/L (ref 98–111)
Creatinine: 1.05 mg/dL (ref 0.61–1.24)
GFR, Estimated: >60 mL/min (ref >60)
Glucose, Bld: 99 mg/dL (ref 70–99)
Potassium: 4.3 mmol/L (ref 3.5–5.1)
Sodium: 138 mmol/L (ref 135–145)
Total Bilirubin: 0.6 mg/dL (ref 0.0–1.2)
Total Protein: 6.8 g/dL (ref 6.5–8.1)

## 2024-05-20 LAB — TSH: TSH: 1.23 u[IU]/mL (ref 0.350–4.500)

## 2024-05-20 NOTE — Progress Notes (Signed)
 CT CAP 04/29/24.

## 2024-05-20 NOTE — Telephone Encounter (Signed)
 Called pt to sched CT - pt and pt daughter were on phone call - both confirmed appt date/time/location - requested appt reminder via mail - LH

## 2024-05-20 NOTE — Progress Notes (Signed)
 [facility] CONSULT NOTE  Randall Ali Care Team: Rilla Baller, MD as PCP - General (Family Medicine) Perla Evalene PARAS, MD as PCP - Cardiology (Cardiology) Marylynn Verneita CROME, MD (Internal Medicine) Perla Evalene PARAS, MD as Consulting Physician (Cardiology) Skeet Juliene SAUNDERS, DO as Consulting Physician (Neurology) Rennie Cindy SAUNDERS, MD as Consulting Physician (Oncology) Verdene Gills, RN as Oncology Nurse Navigator  CHIEF COMPLAINTS/PURPOSE OF CONSULTATION: lung cancer    Oncology History Overview Note  #MAY 2022-Lung cancer-non-small cell [CT guided bx] T3N1 vs stage IV Dr.Hendrickson.  MRI brain negative for malignancy.# 1. April 2022- LLL ~4.0 cm mass in the superior segment left lower lobe abuts the major fissure and the posterior pleural surface without visible chest wall invasion without pleural effusion. There 2-3 other small nodules in the left lower lobe, largest measures 9 mm in diameter. These are suspicious for same lobe satellite lesions and there is left hilar adenopathy. Assuming non-small cell lung cancer the appearance is compatible with T3 N1 M0 disease (stage IIIA).  # right suprarenal nodule-awaiting biopsy on 5/31- non-small cell [revived at tumor conference at [facility]]  # June 9th 2022- CARBO-TAXOl -KEYTRUDA ; Fulphila   # ? Subtle adrenal insufficiency- No acute process - -September MRI brain negative for any pituitary hypophysitis/brain metastasis. Prednisone    MOLECULAR TESTING: NGS TPS-PDL 100%; EXON 12 amplification* mutations.  # CAD [CABG 2016; Dr.Gollan]   Cancer of lower lobe of left lung (HCC)  11/02/2020 Initial Diagnosis   Cancer of lower lobe of left lung (HCC)   11/02/2020 Cancer Staging   Staging form: Lung, AJCC 8th Edition - Clinical: Stage IVA (cT3, cN1, cM1a) - Signed by Ragan Reale R, MD on 11/02/2020   11/16/2020 - 01/02/2022 Chemotherapy   Randall Ali is on Treatment Plan : LUNG NSCLC Carboplatin  + Paclitaxel  + Pembrolizumab  q21d  x 4 cycles / Pembrolizumab  Maintenance Q21D     11/16/2020 - 12/26/2021 Chemotherapy   Randall Ali is on Treatment Plan : LUNG NSCLC Carboplatin  (6) + Paclitaxel  (200) + Pembrolizumab  (200) D1 q21d x 4 cycles / Pembrolizumab  (200) Maintenance D1 q21d      HISTORY OF PRESENTING ILLNESS: Accompanied by daughter.  Walking independently.  Randall Ali. 83 y.o.  male lung cancer-non-small cell stage IV [supra-renal nodule s/p biopsy] currently on surveillance because of poor tolerance [ Keytruda  is on hold since July 2023 because of multiple hospitalizations/HSV encephalitis] is here to review the results of the CT scan.  Discussed the use of AI scribe software for clinical note transcription with the Randall Ali, who gave verbal consent to proceed.  History of Present Illness   Randall Ali. is an 83 year old male with lung cancer, status post chemotherapy, Randall Ali is currently under surveillance. who presents for routine oncology follow-up.  Randall Ali has a history of lung cancer and completed chemotherapy in July 2023.  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  Denies any worsening cough. NO new shortness of breath. However,  Randall Ali continues to have intermittent cognitive deficits.  As per the daughter neuro cognitively worse.  Randall Ali is currently living with his daughter in [place].      Randall Ali denies new symptoms and there is no evidence of disease progression.  Randall Ali has a vascular access port in place. Removal of the port has been delayed due to scheduling constraints, and Randall Ali intends to arrange removal after the holiday season. Randall Ali denies lower extremity swelling.      Review of Systems  Constitutional:  Positive for malaise/fatigue. Negative  for chills, diaphoresis, fever and weight loss.  HENT:  Negative for nosebleeds and sore throat.   Eyes:  Negative for double vision.  Respiratory:  Negative for cough, hemoptysis, sputum production, shortness of breath and  wheezing.   Cardiovascular:  Negative for chest pain, palpitations, orthopnea and leg swelling.  Gastrointestinal:  Negative for abdominal pain, blood in stool, constipation, diarrhea, heartburn, melena and vomiting.  Genitourinary:  Negative for dysuria, frequency and urgency.  Musculoskeletal:  Positive for back pain and joint pain.  Skin: Negative.  Negative for itching and rash.  Neurological:  Positive for dizziness and tremors. Negative for tingling, focal weakness, weakness and headaches.  Endo/Heme/Allergies:  Does not bruise/bleed easily.  Psychiatric/Behavioral:  Positive for memory loss. Negative for depression. The Randall Ali is not nervous/anxious and does not have insomnia.      MEDICAL HISTORY:  Past Medical History:  Diagnosis Date   Allergy    seasonal   Arthritis    all over- in general    CAD (coronary artery disease)    a. inferior wall MI 10/01 s/p PCI/DES to RCA; b. Myoview 3/16 neg for ischemia; c. LHC 8/16: ostLAD 80%, OM1 70%, OM2 70% x 2 lesions, mRCA 30%, dRCA 70% s/p 4-V CABG 01/24/15 (LIMA-LAD, VG- OM1, VG-OM2, VG-PDA)    Cancer (HCC)    skin, melanoma   Carotid artery disease    a. US  8/16: 1-39% bilateral ICA stenosis   Cataract    removed   Diastolic dysfunction    a. 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   stent RCA   Lung cancer (HCC)    Postoperative wound infection 02/02/2015   Reflux esophagitis    Sleep apnea 2017   CPAP at night    SURGICAL HISTORY: Past Surgical History:  Procedure Laterality Date   arm surgery  2010   BROW LIFT Bilateral 11/25/2019   Procedure: BROW PTOSIS REPAIR BILATERAL;  Surgeon: Ashley Greig HERO, MD;  Location: [facility];  Service:  Ophthalmology;  Laterality: Bilateral;  sleep apnea   CARDIAC CATHETERIZATION  06/24/2011   CARDIAC CATHETERIZATION N/A 01/18/2015   Procedure: Left Heart Cath with coronary angiography;  Surgeon: Evalene JINNY Lunger, MD;  Location: [facility];  Service: Cardiovascular;  Laterality: N/A;   CARDIAC CATHETERIZATION N/A 01/18/2015   Procedure: Intravascular Pressure Wire/FFR Study;  Surgeon: Deatrice DELENA Cage, MD;  Location: [facility];  Service: Cardiovascular;  Laterality: N/A;   CAROTID STENT  03/10/2011   COLONOSCOPY  2010   COLONOSCOPY  06/14/2014   Dr Albertus   COLONOSCOPY  01/2022   done for colon thickening at splenic flexure - 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[name])   CORONARY ARTERY BYPASS GRAFT N/A 01/24/2015   Procedure: CORONARY ARTERY BYPASS GRAFTING x 4 (LIMA-LAD, SVG-Int 1- Int 2, SVG-PD) ENDOSCOPIC GREATER SAPHENOUS VEIN HARVEST LEFT LEG;  Surgeon: Dallas KATHEE Jude, MD;  Location: [facility] OR;  Service: Open Heart Surgery;  Laterality: N/A;   EMBOLECTOMY  06/15/2019   Procedure: EMBOLECTOMY;  Surgeon: Jama Cordella MATSU, MD;  Location: [facility];  Service: Vascular;;  right superficial femoral artery   ENDARTERECTOMY FEMORAL Right 06/15/2019   Procedure: ENDARTERECTOMY FEMORAL;  Surgeon: Jama Cordella  G, MD;  Location: [facility];  Service: Vascular;  Laterality: Right;  common femoral profunda femoris superficial femoral   ESOPHAGOGASTRODUODENOSCOPY (EGD) WITH PROPOFOL  N/A 04/24/2016   Procedure: ESOPHAGOGASTRODUODENOSCOPY (EGD) WITH PROPOFOL ;  Surgeon: Gordy CHRISTELLA Starch, MD;  Location: [facility];  Service: Gastroenterology;  Laterality: N/A;   EYE SURGERY     lasik 15 yrs. ago, cataracts removed - both eyes    HAMMER TOE SURGERY     right toe   INSERTION OF ILIAC STENT Right 06/15/2019   Procedure: INSERTION OF ILIAC STENT ( STENTING OF SFA/POP ARTERY );  Surgeon: Jama Cordella MATSU, MD;  Location: [facility];  Service: Vascular;  Laterality: Right;   angioplpasty and stent placement: right superficial femoral right tibiopopliteal trunk bilateral common iliac arteries   IR IMAGING GUIDED PORT INSERTION  11/09/2020   LEFT HEART CATH AND CORONARY ANGIOGRAPHY Left 06/10/2017   Procedure: LEFT HEART CATH AND CORONARY ANGIOGRAPHY;  Surgeon: Perla Evalene PARAS, MD;  Location: [facility];  Service: Cardiovascular;  Laterality: Left;   LOWER EXTREMITY ANGIOGRAPHY Left 01/04/2019   Procedure: LOWER EXTREMITY ANGIOGRAPHY;  Surgeon: Jama Cordella MATSU, MD;  Location: [facility];  Service: Cardiovascular;  Laterality: Left;   LOWER EXTREMITY ANGIOGRAPHY Right 01/25/2019   Procedure: LOWER EXTREMITY ANGIOGRAPHY;  Surgeon: Jama Cordella MATSU, MD;  Location: [facility];  Service: Cardiovascular;  Laterality: Right;   LOWER EXTREMITY ANGIOGRAPHY Right 01/15/2021   LOWER EXTREMITY ANGIOGRAPHY and stent placement to R SFA and popliteal artery Zoe, Cordella MATSU, MD)   NASAL SINUS SURGERY  2008   septpolasty, bilateral turbinate reduction   SHOULDER ARTHROSCOPY  2012   TEE WITHOUT CARDIOVERSION N/A 01/24/2015   Procedure: TRANSESOPHAGEAL ECHOCARDIOGRAM (TEE);  Surgeon: Dallas KATHEE Jude, MD;  Location: [facility] OR;  Service: Open Heart Surgery;  Laterality: N/A;   TOE SURGERY  1994   UPPER GI ENDOSCOPY  07/2014, 04-24-16   Dr Lovenia   WRIST SURGERY  2011    SOCIAL HISTORY: Social History   Socioeconomic History   Marital status: Single    Spouse name: Not on file   Number of children: Not on file   Years of education: 12   Highest education level: 12th grade  Occupational History   Occupation: retired    Comment: Pensions Consultant  Tobacco Use   Smoking status: Former    Current packs/day: 0.00    Average packs/day: 2.5 packs/day for 40.0 years (100.0 ttl pk-yrs)    Types: Cigarettes    Start date: 03/24/1960    Quit date: 03/24/2000    Years since quitting: 24.1   Smokeless tobacco: Never  Vaping Use   Vaping status:  Never Used  Substance and Sexual Activity   Alcohol  use: Yes    Comment: occassionally   Drug use: No   Sexual activity: Yes  Other Topics Concern   Not on file  Social History Narrative   Singled; lives with son and dog    Occ: retired, back part time at [employer];    Activity: gym 4-5d/wk   Diet: good water , fruits/vegetables daily   Caffeine Use-yes      H/o military service   ------------------------------------       Retail banker- retd; [employer] store- retd; quit smoking 2001. Alcohol  couple nights a week. Live in [place]. Daughter lives 10 mins.    Social Drivers of Health   Tobacco Use: Medium Risk (05/20/2024)   Randall Ali History    Smoking Tobacco Use: Former  Smokeless Tobacco Use: Never    Passive Exposure: Not on file  Financial Resource Strain: Low Risk (04/18/2024)   Overall Financial Resource Strain (CARDIA)    Difficulty of Paying Living Expenses: Not hard at all  Food Insecurity: No Food Insecurity (04/18/2024)   Epic    Worried About Programme Researcher, Broadcasting/film/video in the Last Year: Never true    Ran Out of Food in the Last Year: Never true  Transportation Needs: No Transportation Needs (04/18/2024)   Epic    Lack of Transportation (Medical): No    Lack of Transportation (Non-Medical): No  Physical Activity: Inactive (04/18/2024)   Exercise Vital Sign    Days of Exercise per Week: 0 days    Minutes of Exercise per Session: Not on file  Stress: No Stress Concern Present (04/18/2024)   [employer] of Occupational Health - Occupational Stress Questionnaire    Feeling of Stress: Not at all  Social Connections: Socially Isolated (04/18/2024)   Social Connection and Isolation Panel    Frequency of Communication with Friends and Family: Once a week    Frequency of Social Gatherings with Friends and Family: Once a week    Attends Religious Services: Never    Database Administrator or Organizations: No    Attends Engineer, Structural: Not on file    Marital  Status: Divorced  Intimate Partner Violence: Not At Risk (11/25/2023)   Epic    Fear of Current or Ex-Partner: No    Emotionally Abused: No    Physically Abused: No    Sexually Abused: No  Depression (PHQ2-9): Low Risk (05/20/2024)   Depression (PHQ2-9)    PHQ-2 Score: 0  Alcohol  Screen: Low Risk (11/24/2022)   Alcohol  Screen    Last Alcohol  Screening Score (AUDIT): 0  Housing: Low Risk (04/18/2024)   Epic    Unable to Pay for Housing in the Last Year: No    Number of Times Moved in the Last Year: 0    Homeless in the Last Year: No  Utilities: Not At Risk (11/25/2023)   Epic    Threatened with loss of utilities: No  Health Literacy: Adequate Health Literacy (11/25/2023)   B1300 Health Literacy    Frequency of need for help with medical instructions: Never    FAMILY HISTORY: Family History  Problem Relation Age of Onset   Hypertension Mother    Heart disease Mother    Hypertension Father    Diabetes Father    Lymphoma Sister    Heart disease Brother 2   Cancer Paternal Grandfather    Colon cancer Neg Hx    Prostate cancer Neg Hx    Bladder Cancer Neg Hx    Kidney cancer Neg Hx    Colon polyps Neg Hx    Esophageal cancer Neg Hx    Pancreatic cancer Neg Hx    Stomach cancer Neg Hx     ALLERGIES:  has no known allergies.  MEDICATIONS:  Current Outpatient Medications  Medication Sig Dispense Refill   acetaminophen  (TYLENOL ) 325 MG tablet Take 650 mg by mouth as needed for moderate pain or mild pain.     albuterol  (VENTOLIN  HFA) 108 (90 Base) MCG/ACT inhaler Inhale 2 puffs into the lungs every 6 (six) hours as needed for wheezing or shortness of breath. 18 g 2   Ascorbic Acid  (VITAMIN C ) 1000 MG tablet Take 1 tablet (1,000 mg total) by mouth daily.     aspirin  81 MG EC tablet Take  81 mg by mouth daily.       cetirizine (ZYRTEC) 10 MG chewable tablet Chew 10 mg by mouth daily.     Cholecalciferol  (VITAMIN D3) 50 MCG (2000 UT) TABS Take 2,000 Units by mouth daily. 30  tablet    Cyanocobalamin  (B-12) 5000 MCG CAPS Take 5,000 mcg by mouth daily.     docusate sodium  (COLACE) 100 MG capsule Take 200 mg by mouth at bedtime.     DULoxetine  (CYMBALTA ) 20 MG capsule Take 2 capsules (40 mg total) by mouth daily. 180 capsule 3   ezetimibe  (ZETIA ) 10 MG tablet TAKE 1 TABLET BY MOUTH DAILY 90 tablet 3   finasteride  (PROSCAR ) 5 MG tablet Take 1 tablet (5 mg total) by mouth daily. 90 tablet 3   fluticasone  (FLONASE ) 50 MCG/ACT nasal spray Place 2 sprays into both nostrils daily.     guaiFENesin (MUCINEX) 600 MG 12 hr tablet Take by mouth 2 (two) times daily.     lidocaine -prilocaine  (EMLA ) cream Apply 30 -45 mins prior to port access. 30 g 3   Melatonin 10 MG CAPS Take 10 mg by mouth at bedtime as needed (sleep).     metoprolol  tartrate (LOPRESSOR ) 25 MG tablet 1 tablet with food Orally Twice a day; Duration: 30 day(s)     nitroGLYCERIN  (NITROSTAT ) 0.4 MG SL tablet Place 1 tablet (0.4 mg total) under the tongue every 5 (five) minutes as needed for chest pain. 25 tablet 3   pantoprazole  (PROTONIX ) 40 MG tablet 1 tablet Orally Once a day; Duration: 30 day(s)     polyethylene glycol powder (GLYCOLAX /MIRALAX ) 17 GM/SCOOP powder Take 8.5-17 g by mouth daily as needed for moderate constipation. 510 g 1   simvastatin  (ZOCOR ) 40 MG tablet Take 1 tablet (40 mg total) by mouth at bedtime. 90 tablet 3   valsartan  (DIOVAN ) 80 MG tablet Take 40 mg by mouth daily.     Vitamin E  450 MG (1000 UT) CAPS Take 450 Units by mouth daily.     Zinc  50 MG TABS Take 50 mg by mouth daily.     tamsulosin  (FLOMAX ) 0.4 MG CAPS capsule 1 capsule.     No current facility-administered medications for this visit.   Facility-Administered Medications Ordered in Other Visits  Medication Dose Route Frequency Provider Last Rate Last Admin   heparin  lock flush 100 UNIT/ML injection               .  PHYSICAL EXAMINATION: ECOG PERFORMANCE STATUS: 0 - Asymptomatic  Vitals:   05/20/24 0951  BP: 118/63   Pulse: 76  Resp: 20  Temp: 97.6 F (36.4 C)  SpO2: 96%      Filed Weights   05/20/24 0951  Weight: 214 lb 6.4 oz (97.3 kg)        Physical Exam HENT:     Head: Normocephalic and atraumatic.     Mouth/Throat:     Pharynx: No oropharyngeal exudate.  Eyes:     Pupils: Pupils are equal, round, and reactive to light.  Cardiovascular:     Rate and Rhythm: Normal rate and regular rhythm.  Pulmonary:     Effort: No respiratory distress.     Breath sounds: No wheezing.     Comments: Decreased air entry bilaterally. Abdominal:     General: Bowel sounds are normal. There is no distension.     Palpations: Abdomen is soft. There is no mass.     Tenderness: There is no abdominal tenderness. There is no guarding or  rebound.  Musculoskeletal:        General: No tenderness. Normal range of motion.     Cervical back: Normal range of motion and neck supple.  Skin:    General: Skin is warm.  Neurological:     Mental Status: Randall Ali is alert and oriented to person, place, and time.  Psychiatric:        Mood and Affect: Affect normal.      LABORATORY DATA:  I have reviewed the data as listed Lab Results  Component Value Date   WBC 7.7 05/20/2024   HGB 14.6 05/20/2024   HCT 43.4 05/20/2024   MCV 91.9 05/20/2024   PLT 209 05/20/2024   Recent Labs    08/18/23 0847 11/20/23 0940 05/20/24 1003  NA 136 138 138  K 3.7 4.0 4.3  CL 105 107 104  CO2 24 22 22   GLUCOSE 107* 103* 99  BUN 18 16 15   CREATININE 1.28* 1.12 1.05  CALCIUM  9.1 9.1 9.7  GFRNONAA 56* >60 >60  PROT 6.8 7.0 6.8  ALBUMIN  3.8 4.2 4.4  AST 17 17 16   ALT 16 16 14   ALKPHOS 53 60 66  BILITOT 1.0 0.8 0.6    RADIOGRAPHIC STUDIES: I have personally reviewed the radiological images as listed and agreed with the findings in the report. CT CHEST ABDOMEN PELVIS W CONTRAST Result Date: 05/06/2024 CLINICAL DATA:  Lung cancer restaging.  * Tracking Code: BO * EXAM: CT CHEST, ABDOMEN, AND PELVIS WITH CONTRAST  TECHNIQUE: Multidetector CT imaging of the chest, abdomen and pelvis was performed following the standard protocol during bolus administration of intravenous contrast. RADIATION DOSE REDUCTION: This exam was performed according to the departmental dose-optimization program which includes automated exposure control, adjustment of the mA and/or kV according to Randall Ali size and/or use of iterative reconstruction technique. CONTRAST:  100mL ISOVUE -300 IOPAMIDOL  (ISOVUE -300) INJECTION 61% COMPARISON:  11/10/2023 FINDINGS: CT CHEST FINDINGS Cardiovascular: The heart size is normal. No substantial pericardial effusion. Coronary artery calcification is evident. Mild atherosclerotic calcification is noted in the wall of the thoracic aorta. Status post CABG. Right Port-A-Cath tip is positioned in the distal SVC near the junction with the RA. Mediastinum/Nodes: No mediastinal lymphadenopathy. There is no hilar lymphadenopathy. The esophagus has normal imaging features. There is no axillary lymphadenopathy. Lungs/Pleura: Centrilobular and paraseptal emphysema evident. 7 mm anterior right upper lobe pulmonary nodule shows some speckled calcification, likely granuloma in stable in the interval. Chronic interstitial changes noted towards the lung bases with peripheral micro nodularity, similar to prior and compatible sequelae of prior infection/inflammation. No pleural effusion. Musculoskeletal: No worrisome lytic or sclerotic osseous abnormality. CT ABDOMEN PELVIS FINDINGS Hepatobiliary: 14 mm cyst identified in the dome of the left liver. Scattered tiny hypodensities in the liver parenchyma are too small to characterize but are statistically most likely benign. No followup imaging is recommended. Gallbladder is nondistended. No intrahepatic or extrahepatic biliary dilation. Pancreas: No focal mass lesion. No dilatation of the main duct. No intraparenchymal cyst. No peripancreatic edema. Spleen: No splenomegaly. No suspicious  focal mass lesion. Adrenals/Urinary Tract: No adrenal nodule or mass. Stable 14 mm rim calcified exophytic lesion upper pole right kidney. This was present on a study from 04/25/2021 and measured up to 16 mm at that time, compatible treated known metastasis as biopsied on 11/06/2020. Tiny well-defined homogeneous low-density lesions in the left kidney are too small to characterize but are statistically most likely benign and probably cysts. No followup imaging is recommended. No evidence for hydroureter. The  urinary bladder appears normal for the degree of distention. Stomach/Bowel: Stomach is unremarkable. No gastric wall thickening. No evidence of outlet obstruction. Duodenum is normally positioned as is the ligament of Treitz. No small bowel wall thickening. No small bowel dilatation. The terminal ileum is normal. The appendix is normal. No gross colonic mass. No colonic wall thickening. Vascular/Lymphatic: There is advanced atherosclerotic calcification of the abdominal aorta without aneurysm. There is no gastrohepatic or hepatoduodenal ligament lymphadenopathy. No retroperitoneal or mesenteric lymphadenopathy. No pelvic sidewall lymphadenopathy. Reproductive: The prostate gland and seminal vesicles are unremarkable. Other: No intraperitoneal free fluid. Musculoskeletal: Small left groin hernia contains only fat. Tiny umbilical hernia contains fat. No worrisome lytic or sclerotic osseous abnormality. IMPRESSION: 1. Stable exam. No new or progressive findings to suggest recurrent or metastatic disease in the chest, abdomen, or pelvis. 2. Stable 7 mm anterior right upper lobe pulmonary nodule with some speckled calcification. 3. Stable 14 mm rim calcified retroperitoneal nodule adjacent to the upper pole right kidney, compatible with known treated metastasis. 4. Small left groin hernia contains only fat. 5. Aortic Atherosclerosis (ICD10-I70.0) and Emphysema (ICD10-J43.9). Electronically Signed   By: Camellia Candle  M.D.   On: 05/06/2024 07:04     ASSESSMENT & PLAN:   Cancer of lower lobe of left lung (HCC) # LLL- Lung cancer-non-small cell stage IV [right suprarenal nodule-s/p biopsy non-small cell ca].  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  Last keytruda  was July, 27th, 2023.   #  CAP CT scan JNOV 21, 2025- 1. Stable exam. No new or progressive findings to suggest recurrent or metastatic disease in the chest, abdomen, or pelvis. Stable 7 mm anterior right upper lobe pulmonary nodule with some speckled calcification;  Stable 14 mm rim calcified retroperitoneal nodule adjacent to the upper pole right kidney, compatible with known treated metastasis.   # Discontinue further therapy- given overall stable disease/poor tolerance to therapy-. Continue surveillance imaging in 6  months.     #  incidental CT [place]  2024- 1.5. Hyperattenuating exophytic lesion of the upper pole of the right kidney is new when compared with Oct 08, 2020 prior and concerning for a small RCC. Randall Ali/daughter declined.JUNE 2025- stable- monitor for now.   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  Likely contributing to Randall Ali's cognitive deficits- [Dr.vaslow] -overall stable as per daughter- -stable.  # CAD-s/p CABG-/PVD-s/p stent [Dr.Schneir]-improvement in pain noted.  On Plavix  --stable.  # IV access: Mediport functioning-discussed regarding port explantation as Randall Ali is not a candidate for any further treatment even if Randall Ali has recurrent disease.  Will schedule at their convenience regarding explantation.  I spoke at length with the Randall Ali's  daughter-  regarding the Randall Ali's clinical status/plan of care.  Family agreement.   # DISPOSITION: # refer to IR re: port explanation [pt not decided yet; so keep port flushes for  now] # port flush in 3 months # follow up in 6 months with MD; port/ labs-cbc/cmp;TSH- No chemo; CT CAP-- Dr.B  # I reviewed the blood work- with the Randall Ali in detail; also reviewed the imaging independently [as summarized above]; and with the Randall Ali in detail.         All  questions were answered. The Randall Ali knows to call the clinic with any problems, questions or concerns.    Cindy JONELLE Joe, MD 05/20/2024 11:39 AM

## 2024-05-20 NOTE — Assessment & Plan Note (Addendum)
#   [facility]- Lung cancer-non-small cell stage IV [right suprarenal nodule-s/p biopsy non-small cell ca].  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  Last keytruda  was July, 27th, 2023.   #  CAP CT scan JNOV 21, 2025- 1. Stable exam. No new or progressive findings to suggest recurrent or metastatic disease in the chest, abdomen, or pelvis. Stable 7 mm anterior right upper lobe pulmonary nodule with some speckled calcification;  Stable 14 mm rim calcified retroperitoneal nodule adjacent to the upper pole right kidney, compatible with known treated metastasis.   # Discontinue further therapy- given overall stable disease/poor tolerance to therapy-. Continue surveillance imaging in 6  months.     #  incidental CT DE  2024- 1.5. Hyperattenuating exophytic lesion of the upper pole of the right kidney is new when compared with Oct 08, 2020 prior and concerning for a small RCC. Patient/daughter declined.JUNE 2025- stable- monitor for now.   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  Likely contributing to patient's cognitive deficits- [Dr.vaslow] -overall stable as per daughter- -stable.  # CAD-s/p CABG-/PVD-s/p stent [Dr.Schneir]-improvement in pain noted.  On Plavix  --stable.  # IV access: Mediport functioning-discussed regarding port explantation as patient is not a candidate for any further treatment even if patient has recurrent disease.  Will schedule at their convenience regarding explantation.  I spoke at length with the patient's  daughter-  regarding the patient's clinical status/plan of care.  Family agreement.   # DISPOSITION: # refer to IR re: port explanation [pt not decided yet; so keep port flushes for now] # port flush in 3 months # follow up in 6 months with MD; port/ labs-cbc/cmp;TSH- No chemo;  CT CAP-- Dr.B  # I reviewed the blood work- with the patient in detail; also reviewed the imaging independently [as summarized above]; and with the patient in detail.

## 2024-05-30 ENCOUNTER — Encounter: Payer: Self-pay | Admitting: Nurse Practitioner

## 2024-05-30 ENCOUNTER — Ambulatory Visit: Admitting: Nurse Practitioner

## 2024-05-30 VITALS — BP 106/64 | HR 79 | Temp 97.6°F | Ht 67.0 in | Wt 218.0 lb

## 2024-05-30 DIAGNOSIS — C3492 Malignant neoplasm of unspecified part of left bronchus or lung: Secondary | ICD-10-CM

## 2024-05-30 DIAGNOSIS — Z87891 Personal history of nicotine dependence: Secondary | ICD-10-CM | POA: Diagnosis not present

## 2024-05-30 DIAGNOSIS — G4733 Obstructive sleep apnea (adult) (pediatric): Secondary | ICD-10-CM | POA: Diagnosis not present

## 2024-05-30 NOTE — Progress Notes (Signed)
 "  @Patient  ID: Randall Ali., male    DOB: Feb 16, 1941, 83 y.o.   MRN: [id number]  Chief Complaint  Patient presents with   Obstructive Sleep Apnea    No problems with mask or pressure.    Referring provider: Rilla Baller, MD  HPI: 83 year old male, former smoker followed for OSA on BiPAP and hx of NSCLC. Last seen in office 04/10/2023. Past medical history significant for HTN, a fib, CAD s/p CABG, obesity.  TEST/EVENTS:  2016 PSG: AHI 13.3/h  04/10/2023: Randall Ali with [facility]. On BiPAP for several years. 100% compliance with avg use 8 h/night. Residual AHI 0.3/h. well controlled. Some air leaks. Mask was last replaced six to eight months ago. No difficulties falling asleep. No evidence of disease recurrence on recent CT chest.  05/30/2024: Today - follow up Discussed the use of AI scribe software for clinical note transcription with the patient, who gave verbal consent to proceed.  History of Present Illness Randall Ali. is an 83 year old male who presents for routine follow-up. He is accompanied by his son.  He is being followed for sleep apnea and is currently using a BiPAP machine. He uses the machine nightly and is doing well with it, despite experiencing some large leaks. These are not bothersome to him. He feels good overall and has good energy during the day, especially when he is active. No drowsy driving.   He recently had a CT scan of his chest with his oncology team, which showed stable results.  He received his flu shot this year, continuing his practice of getting vaccinated annually.    Allergies[1]  Immunization History  Administered Date(s) Administered   Fluad Quad(high Dose 65+) 03/17/2019, 03/23/2020, 04/08/2022   Fluad Trivalent(High Dose 65+) 04/10/2023   INFLUENZA, HIGH DOSE SEASONAL PF 04/05/2013, 02/08/2016, 06/06/2021, 04/19/2024   Influenza Split 02/27/2012, 04/10/2014   Influenza,inj,Quad PF,6+ Mos 03/22/2015, 02/25/2017, 03/11/2018    PFIZER(Purple Top)SARS-COV-2 Vaccination 06/30/2019, 07/21/2019, 04/03/2020   PNEUMOCOCCAL CONJUGATE-20 04/19/2024   Pneumococcal Conjugate-13 04/13/2015   Pneumococcal Polysaccharide-23 02/26/2005, 02/27/2012   Tdap 04/18/2015   Zoster Recombinant(Shingrix) 09/23/2017, 02/12/2018   Zoster, Live 04/06/2011    Past Medical History:  Diagnosis Date   Allergy    seasonal   Arthritis    all over- in general    CAD (coronary artery disease)    a. inferior wall MI 10/01 s/p PCI/DES to RCA; b. Myoview 3/16 neg for ischemia; c. LHC 8/16: ostLAD 80%, OM1 70%, OM2 70% x 2 lesions, mRCA 30%, dRCA 70% s/p 4-V CABG 01/24/15 (LIMA-LAD, VG- OM1, VG-OM2, VG-PDA)    Cancer (HCC)    skin, melanoma   Carotid artery disease    a. US  8/16: 1-39% bilateral ICA stenosis   Cataract    removed   Diastolic dysfunction    a. 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     albuterol  (VENTOLIN  HFA) 108 (90 Base) MCG/ACT inhaler Inhale 2 puffs into the lungs every 6 (six) hours as needed for wheezing or shortness of breath. 18 g 2   Ascorbic Acid  (VITAMIN C ) 1000 MG tablet Take 1 tablet (1,000 mg total) by mouth daily.     aspirin  81 MG EC tablet Take 81 mg by mouth daily.       cetirizine (ZYRTEC) 10 MG chewable tablet Chew 10 mg by mouth daily.     Cholecalciferol   (VITAMIN D3) 50 MCG (2000 UT) TABS Take 2,000 Units by mouth daily. 30 tablet    Cyanocobalamin  (B-12) 5000 MCG CAPS Take 5,000 mcg by mouth daily.     docusate sodium  (COLACE) 100 MG capsule Take 200 mg by mouth at bedtime.     DULoxetine  (CYMBALTA ) 20 MG capsule Take 2 capsules (40 mg total) by mouth daily. 180 capsule 3   ezetimibe  (ZETIA ) 10 MG tablet TAKE 1 TABLET BY MOUTH DAILY 90 tablet 3   finasteride  (PROSCAR ) 5 MG tablet Take 1 tablet (5 mg total) by mouth daily. 90 tablet 3   fluticasone  (FLONASE ) 50 MCG/ACT nasal spray Place 2 sprays into both nostrils daily.     guaiFENesin (MUCINEX) 600 MG 12 hr tablet Take by mouth 2 (two) times daily.     lidocaine -prilocaine  (EMLA ) cream Apply 30 -45 mins prior to port access. 30 g 3   Melatonin 10 MG CAPS Take 10 mg by mouth at bedtime as needed (sleep).     metoprolol  tartrate (LOPRESSOR ) 25 MG tablet 1 tablet with food Orally Twice a day; Duration: 30 day(s)     nitroGLYCERIN  (NITROSTAT ) 0.4 MG SL tablet Place 1 tablet (0.4 mg total) under the tongue every 5 (five) minutes as needed for chest pain. 25 tablet 3   pantoprazole  (PROTONIX ) 40 MG tablet 1 tablet Orally Once a day; Duration: 30 day(s)     polyethylene glycol powder (GLYCOLAX /MIRALAX ) 17 GM/SCOOP powder Take 8.5-17 g by mouth daily as needed for moderate constipation. 510 g 1   simvastatin  (ZOCOR ) 40 MG tablet Take 1 tablet (40 mg total) by mouth at bedtime. 90 tablet 3   tamsulosin  (FLOMAX ) 0.4 MG CAPS capsule 1 capsule.     valsartan  (DIOVAN ) 80 MG tablet Take 40 mg by mouth daily.     Vitamin E  450 MG (1000 UT) CAPS Take 450 Units by mouth daily.     Zinc  50 MG TABS Take 50 mg by mouth daily.     Facility-Administered Medications Prior to Visit  Medication Dose Route Frequency Provider Last Rate Last Admin   heparin  lock flush 100 UNIT/ML injection              Review of Systems: as above    Physical Exam:  BP 106/64   Pulse 79   Temp 97.6 F (36.4 C) (Temporal)    Ht 5' 7 (1.702 m)   Wt 218 lb (98.9 kg)   SpO2 95%   BMI 34.14 kg/m   GEN: Pleasant, interactive, well-appearing; obese; elderly; in no acute distress HEENT:  Normocephalic and atraumatic. PERRLA. Sclera white. Nasal turbinates pink, moist and patent bilaterally. No rhinorrhea present. Oropharynx pink and moist, without exudate or edema. No lesions, ulcerations, or postnasal drip.  NECK:  Supple w/ fair ROM.  CV: RRR, no m/r/g PULMONARY:  Unlabored, regular breathing. Clear bilaterally A&P w/o  wheezes/rales/rhonchi. No accessory muscle use.  GI: BS present and normoactive. Soft, non-tender to palpation.  Neuro: A/Ox3. No focal deficits noted.   Skin: Warm, no lesions or rashe Psych: Normal affect and behavior. Judgement and thought content appropriate.     Lab Results:  CBC    Component Value Date/Time   WBC 7.7 05/20/2024 1003   WBC 5.7 11/19/2022 0948   RBC 4.72 05/20/2024 1003   HGB 14.6 05/20/2024 1003   HGB 14.8 01/15/2015 0956   HCT 43.4 05/20/2024 1003   HCT 44.2 01/15/2015 0956   PLT 209 05/20/2024 1003   PLT 207 01/15/2015 0956   MCV 91.9 05/20/2024 1003   MCV 91 01/15/2015 0956   MCH 30.9 05/20/2024 1003   MCHC 33.6 05/20/2024 1003   RDW 12.5 05/20/2024 1003   RDW 12.9 01/15/2015 0956   LYMPHSABS 1.2 05/20/2024 1003   LYMPHSABS 1.9 01/15/2015 0956   MONOABS 0.8 05/20/2024 1003   EOSABS 0.1 05/20/2024 1003   EOSABS 0.2 01/15/2015 0956   BASOSABS 0.1 05/20/2024 1003   BASOSABS 0.0 01/15/2015 0956    BMET    Component Value Date/Time   NA 138 05/20/2024 1003   NA 138 01/15/2015 0956   K 4.3 05/20/2024 1003   CL 104 05/20/2024 1003   CO2 22 05/20/2024 1003   GLUCOSE 99 05/20/2024 1003   BUN 15 05/20/2024 1003   BUN 16 01/15/2015 0956   CREATININE 1.05 05/20/2024 1003   CALCIUM  9.7 05/20/2024 1003   GFRNONAA >60 05/20/2024 1003   GFRAA >60 06/16/2019 1126    BNP    Component Value Date/Time   BNP 468.4 (H) 02/11/2022 1845     Imaging:  No  results found.  Administration History     None          Latest Ref Rng & Units 01/22/2015   12:12 PM  PFT Results  FVC-Pre L 4.28   FVC-Predicted Pre % 107   FVC-Post L 4.37   FVC-Predicted Post % 109   Pre FEV1/FVC % % 74   Post FEV1/FCV % % 77   FEV1-Pre L 3.16   FEV1-Predicted Pre % 109   FEV1-Post L 3.36   DLCO uncorrected ml/min/mmHg 23.10   DLCO UNC% % 77   DLVA Predicted % 74   TLC L 8.32   TLC % Predicted % 125   RV % Predicted % 142     No results found for: NITRICOXIDE      Assessment & Plan:   OSA (obstructive sleep apnea) OSA on BiPAP. Excellent compliance and control. Receives benefit from use. Understands risks of untreated OSA and proper care/use of BiPAP machine. Healthy weight management encouraged. Safe driving practices reviewed.  Patient Instructions  Continue to use BiPAP every night, minimum of 4-6 hours a night.  Change equipment as directed. Wash your tubing with warm soap and water  daily, hang to dry. Wash humidifier portion weekly. Use bottled, distilled water  and change daily Be aware of reduced alertness and do not drive or operate heavy machinery if experiencing this or drowsiness.  Exercise encouraged, as tolerated. Healthy weight management discussed.  Avoid or decrease alcohol  consumption and medications that make you more sleepy, if possible. Notify if persistent daytime sleepiness occurs even with consistent use of PAP therapy.  Change CPAP supplies... Every month Mask cushions and/or nasal pillows CPAP machine filters Every 3 months Mask frame (not including the headgear) CPAP tubing Every 6 months Mask headgear Chin strap (if applicable) Humidifier  water  tub   Follow up with oncology as scheduled  Follow up in one year with Dr. Jess, or sooner, if needed    NSCLC of left lung (HCC) Stable without evidence of recurrence. F/u with oncology as scheduled    Advised if symptoms do not improve or worsen, to please  contact office for sooner follow up or seek emergency care.   I spent 25 minutes of dedicated to the care of this patient on the date of this encounter to include pre-visit review of records, face-to-face time with the patient discussing conditions above, post visit ordering of testing, clinical documentation with the electronic health record, making appropriate referrals as documented, and communicating necessary findings to members of the patients care team.  Comer LULLA Rouleau, NP 05/30/2024  Pt aware and understands NP's role.      [1] No Known Allergies [2]  Social History Tobacco Use  Smoking Status Former   Current packs/day: 0.00   Average packs/day: 2.5 packs/day for 40.0 years (100.0 ttl pk-yrs)   Types: Cigarettes   Start date: 03/24/1960   Quit date: 03/24/2000   Years since quitting: 24.2  Smokeless Tobacco Never   "

## 2024-05-30 NOTE — Assessment & Plan Note (Signed)
 Stable without evidence of recurrence. F/u with oncology as scheduled

## 2024-05-30 NOTE — Assessment & Plan Note (Signed)
 OSA on BiPAP. Excellent compliance and control. Receives benefit from use. Understands risks of untreated OSA and proper care/use of BiPAP machine. Healthy weight management encouraged. Safe driving practices reviewed.  Patient Instructions  Continue to use BiPAP every night, minimum of 4-6 hours a night.  Change equipment as directed. Wash your tubing with warm soap and water  daily, hang to dry. Wash humidifier portion weekly. Use bottled, distilled water  and change daily Be aware of reduced alertness and do not drive or operate heavy machinery if experiencing this or drowsiness.  Exercise encouraged, as tolerated. Healthy weight management discussed.  Avoid or decrease alcohol  consumption and medications that make you more sleepy, if possible. Notify if persistent daytime sleepiness occurs even with consistent use of PAP therapy.  Change CPAP supplies... Every month Mask cushions and/or nasal pillows CPAP machine filters Every 3 months Mask frame (not including the headgear) CPAP tubing Every 6 months Mask headgear Chin strap (if applicable) Humidifier water  tub   Follow up with oncology as scheduled  Follow up in one year with Dr. Jess, or sooner, if needed

## 2024-05-30 NOTE — Patient Instructions (Signed)
 Continue to use BiPAP every night, minimum of 4-6 hours a night.  Change equipment as directed. Wash your tubing with warm soap and water  daily, hang to dry. Wash humidifier portion weekly. Use bottled, distilled water  and change daily Be aware of reduced alertness and do not drive or operate heavy machinery if experiencing this or drowsiness.  Exercise encouraged, as tolerated. Healthy weight management discussed.  Avoid or decrease alcohol  consumption and medications that make you more sleepy, if possible. Notify if persistent daytime sleepiness occurs even with consistent use of PAP therapy.  Change CPAP supplies... Every month Mask cushions and/or nasal pillows CPAP machine filters Every 3 months Mask frame (not including the headgear) CPAP tubing Every 6 months Mask headgear Chin strap (if applicable) Humidifier water  tub   Follow up with oncology as scheduled  Follow up in one year with Dr. Jess, or sooner, if needed

## 2024-07-15 ENCOUNTER — Other Ambulatory Visit: Payer: Self-pay | Admitting: Cardiovascular Disease

## 2024-08-18 ENCOUNTER — Inpatient Hospital Stay

## 2024-11-11 ENCOUNTER — Other Ambulatory Visit

## 2024-11-18 ENCOUNTER — Inpatient Hospital Stay

## 2024-11-18 ENCOUNTER — Inpatient Hospital Stay: Admitting: Internal Medicine

## 2024-11-28 ENCOUNTER — Ambulatory Visit
